# Patient Record
Sex: Male | Born: 1945
Health system: Southern US, Community
[De-identification: ages and names within clinical notes are randomized; demographics above are authoritative.]

## PROBLEM LIST (undated history)

## (undated) DIAGNOSIS — N289 Disorder of kidney and ureter, unspecified: Secondary | ICD-10-CM

## (undated) DIAGNOSIS — R350 Frequency of micturition: Secondary | ICD-10-CM

## (undated) DIAGNOSIS — I43 Cardiomyopathy in diseases classified elsewhere: Secondary | ICD-10-CM

## (undated) DIAGNOSIS — I10 Essential (primary) hypertension: Secondary | ICD-10-CM

## (undated) DIAGNOSIS — K5790 Diverticulosis of intestine, part unspecified, without perforation or abscess without bleeding: Secondary | ICD-10-CM

## (undated) DIAGNOSIS — R0600 Dyspnea, unspecified: Secondary | ICD-10-CM

## (undated) DIAGNOSIS — G4733 Obstructive sleep apnea (adult) (pediatric): Secondary | ICD-10-CM

## (undated) DIAGNOSIS — I2510 Atherosclerotic heart disease of native coronary artery without angina pectoris: Secondary | ICD-10-CM

## (undated) DIAGNOSIS — J189 Pneumonia, unspecified organism: Secondary | ICD-10-CM

## (undated) DIAGNOSIS — I639 Cerebral infarction, unspecified: Secondary | ICD-10-CM

## (undated) DIAGNOSIS — R569 Unspecified convulsions: Secondary | ICD-10-CM

## (undated) DIAGNOSIS — H269 Unspecified cataract: Secondary | ICD-10-CM

## (undated) DIAGNOSIS — I219 Acute myocardial infarction, unspecified: Secondary | ICD-10-CM

## (undated) DIAGNOSIS — I509 Heart failure, unspecified: Secondary | ICD-10-CM

## (undated) DIAGNOSIS — K635 Polyp of colon: Secondary | ICD-10-CM

## (undated) DIAGNOSIS — E854 Organ-limited amyloidosis: Secondary | ICD-10-CM

## (undated) DIAGNOSIS — C61 Malignant neoplasm of prostate: Secondary | ICD-10-CM

## (undated) DIAGNOSIS — I5032 Chronic diastolic (congestive) heart failure: Secondary | ICD-10-CM

## (undated) HISTORY — PX: PROSTATE BIOPSY: SHX241

## (undated) HISTORY — DX: Acute myocardial infarction, unspecified: I21.9

## (undated) HISTORY — DX: Essential (primary) hypertension: I10

## (undated) HISTORY — DX: Polyp of colon: K63.5

## (undated) HISTORY — DX: Unspecified cataract: H26.9

## (undated) HISTORY — DX: Obstructive sleep apnea (adult) (pediatric): G47.33

## (undated) HISTORY — DX: Diverticulosis of intestine, part unspecified, without perforation or abscess without bleeding: K57.90

## (undated) HISTORY — DX: Unspecified convulsions: R56.9

## (undated) HISTORY — DX: Cerebral infarction, unspecified: I63.9

## (undated) HISTORY — DX: Disorder of kidney and ureter, unspecified: N28.9

## (undated) HISTORY — DX: Pneumonia, unspecified organism: J18.9

---

## 1997-12-28 ENCOUNTER — Emergency Department (HOSPITAL_COMMUNITY): Admission: EM | Admit: 1997-12-28 | Discharge: 1997-12-28 | Payer: Self-pay | Admitting: Emergency Medicine

## 1999-08-18 ENCOUNTER — Encounter: Payer: Self-pay | Admitting: Internal Medicine

## 1999-08-18 ENCOUNTER — Ambulatory Visit (HOSPITAL_COMMUNITY): Admission: RE | Admit: 1999-08-18 | Discharge: 1999-08-18 | Payer: Self-pay | Admitting: Internal Medicine

## 2000-04-11 ENCOUNTER — Encounter: Admission: RE | Admit: 2000-04-11 | Discharge: 2000-07-10 | Payer: Self-pay | Admitting: Internal Medicine

## 2001-05-15 DIAGNOSIS — K635 Polyp of colon: Secondary | ICD-10-CM

## 2001-05-15 DIAGNOSIS — K5790 Diverticulosis of intestine, part unspecified, without perforation or abscess without bleeding: Secondary | ICD-10-CM

## 2001-05-15 HISTORY — PX: COLONOSCOPY W/ POLYPECTOMY: SHX1380

## 2001-05-15 HISTORY — DX: Polyp of colon: K63.5

## 2001-05-15 HISTORY — DX: Diverticulosis of intestine, part unspecified, without perforation or abscess without bleeding: K57.90

## 2001-09-26 ENCOUNTER — Encounter (INDEPENDENT_AMBULATORY_CARE_PROVIDER_SITE_OTHER): Payer: Self-pay | Admitting: *Deleted

## 2001-09-26 LAB — HM COLONOSCOPY

## 2001-11-21 ENCOUNTER — Encounter: Payer: Self-pay | Admitting: Rheumatology

## 2001-11-21 ENCOUNTER — Ambulatory Visit (HOSPITAL_COMMUNITY): Admission: RE | Admit: 2001-11-21 | Discharge: 2001-11-21 | Payer: Self-pay | Admitting: Rheumatology

## 2003-05-11 ENCOUNTER — Emergency Department (HOSPITAL_COMMUNITY): Admission: EM | Admit: 2003-05-11 | Discharge: 2003-05-11 | Payer: Self-pay | Admitting: Emergency Medicine

## 2004-10-05 ENCOUNTER — Ambulatory Visit: Payer: Self-pay | Admitting: Internal Medicine

## 2004-10-12 ENCOUNTER — Ambulatory Visit: Payer: Self-pay | Admitting: Internal Medicine

## 2005-01-13 ENCOUNTER — Ambulatory Visit: Payer: Self-pay | Admitting: Internal Medicine

## 2005-01-18 ENCOUNTER — Ambulatory Visit: Payer: Self-pay | Admitting: Internal Medicine

## 2005-04-20 ENCOUNTER — Ambulatory Visit: Payer: Self-pay | Admitting: Internal Medicine

## 2006-01-31 ENCOUNTER — Ambulatory Visit: Payer: Self-pay | Admitting: Internal Medicine

## 2007-03-05 ENCOUNTER — Ambulatory Visit: Payer: Self-pay | Admitting: Internal Medicine

## 2007-03-05 DIAGNOSIS — I10 Essential (primary) hypertension: Secondary | ICD-10-CM | POA: Insufficient documentation

## 2007-03-05 DIAGNOSIS — M109 Gout, unspecified: Secondary | ICD-10-CM

## 2007-03-14 ENCOUNTER — Encounter (INDEPENDENT_AMBULATORY_CARE_PROVIDER_SITE_OTHER): Payer: Self-pay | Admitting: *Deleted

## 2007-03-14 LAB — CONVERTED CEMR LAB
Basophils Absolute: 0.2 10*3/uL — ABNORMAL HIGH (ref 0.0–0.1)
Creatinine, Ser: 1.3 mg/dL (ref 0.4–1.5)
Eosinophils Absolute: 0.1 10*3/uL (ref 0.0–0.6)
Eosinophils Relative: 1.2 % (ref 0.0–5.0)
Lymphocytes Relative: 12.5 % (ref 12.0–46.0)
MCHC: 33.2 g/dL (ref 30.0–36.0)
MCV: 82.4 fL (ref 78.0–100.0)
Microalb, Ur: 40.6 mg/dL — ABNORMAL HIGH (ref 0.0–1.9)
Neutro Abs: 5.8 10*3/uL (ref 1.4–7.7)
Neutrophils Relative %: 78.4 % — ABNORMAL HIGH (ref 43.0–77.0)
Platelets: 207 10*3/uL (ref 150–400)
Uric Acid, Serum: 6.1 mg/dL (ref 2.4–7.0)
WBC: 7.3 10*3/uL (ref 4.5–10.5)

## 2007-03-28 ENCOUNTER — Encounter (INDEPENDENT_AMBULATORY_CARE_PROVIDER_SITE_OTHER): Payer: Self-pay | Admitting: *Deleted

## 2007-03-28 ENCOUNTER — Ambulatory Visit: Payer: Self-pay | Admitting: Internal Medicine

## 2007-05-07 ENCOUNTER — Ambulatory Visit: Payer: Self-pay | Admitting: Internal Medicine

## 2007-05-17 ENCOUNTER — Encounter (INDEPENDENT_AMBULATORY_CARE_PROVIDER_SITE_OTHER): Payer: Self-pay | Admitting: *Deleted

## 2007-05-17 LAB — CONVERTED CEMR LAB: Hgb A1c MFr Bld: 7.2 % — ABNORMAL HIGH (ref 4.6–6.0)

## 2007-06-21 ENCOUNTER — Telehealth: Payer: Self-pay | Admitting: Internal Medicine

## 2007-06-24 ENCOUNTER — Telehealth (INDEPENDENT_AMBULATORY_CARE_PROVIDER_SITE_OTHER): Payer: Self-pay | Admitting: *Deleted

## 2007-06-25 ENCOUNTER — Ambulatory Visit: Payer: Self-pay | Admitting: Internal Medicine

## 2007-06-25 ENCOUNTER — Telehealth: Payer: Self-pay | Admitting: Internal Medicine

## 2007-06-25 LAB — CONVERTED CEMR LAB
BUN: 16 mg/dL (ref 6–23)
Calcium: 10 mg/dL (ref 8.4–10.5)
GFR calc Af Amer: 80 mL/min
GFR calc non Af Amer: 66 mL/min
Potassium: 4.2 meq/L (ref 3.5–5.1)

## 2007-06-26 ENCOUNTER — Telehealth (INDEPENDENT_AMBULATORY_CARE_PROVIDER_SITE_OTHER): Payer: Self-pay | Admitting: *Deleted

## 2007-07-02 ENCOUNTER — Ambulatory Visit: Payer: Self-pay | Admitting: Internal Medicine

## 2007-07-18 ENCOUNTER — Encounter: Payer: Self-pay | Admitting: Internal Medicine

## 2007-08-07 ENCOUNTER — Ambulatory Visit (HOSPITAL_COMMUNITY): Admission: RE | Admit: 2007-08-07 | Discharge: 2007-08-07 | Payer: Self-pay | Admitting: Ophthalmology

## 2007-08-22 ENCOUNTER — Ambulatory Visit: Payer: Self-pay | Admitting: Internal Medicine

## 2007-08-31 LAB — CONVERTED CEMR LAB
Hgb A1c MFr Bld: 8.2 % — ABNORMAL HIGH (ref 4.6–6.0)
Potassium: 4.1 meq/L (ref 3.5–5.1)

## 2007-09-05 ENCOUNTER — Encounter: Payer: Self-pay | Admitting: Internal Medicine

## 2007-09-12 ENCOUNTER — Encounter (INDEPENDENT_AMBULATORY_CARE_PROVIDER_SITE_OTHER): Payer: Self-pay | Admitting: *Deleted

## 2007-09-12 ENCOUNTER — Ambulatory Visit: Payer: Self-pay | Admitting: Internal Medicine

## 2007-11-12 ENCOUNTER — Ambulatory Visit: Payer: Self-pay | Admitting: Internal Medicine

## 2007-11-18 ENCOUNTER — Encounter (INDEPENDENT_AMBULATORY_CARE_PROVIDER_SITE_OTHER): Payer: Self-pay | Admitting: *Deleted

## 2007-11-18 LAB — CONVERTED CEMR LAB
Creatinine, Ser: 1.4 mg/dL (ref 0.4–1.5)
Creatinine,U: 282.3 mg/dL
LDL Cholesterol: 62 mg/dL (ref 0–99)
Microalb Creat Ratio: 52.1 mg/g — ABNORMAL HIGH (ref 0.0–30.0)
Microalb, Ur: 14.7 mg/dL — ABNORMAL HIGH (ref 0.0–1.9)
Total CHOL/HDL Ratio: 2.2

## 2007-11-27 ENCOUNTER — Ambulatory Visit: Payer: Self-pay | Admitting: Internal Medicine

## 2008-06-01 ENCOUNTER — Ambulatory Visit: Payer: Self-pay | Admitting: Internal Medicine

## 2008-06-07 LAB — CONVERTED CEMR LAB
BUN: 19 mg/dL (ref 6–23)
Creatinine, Ser: 1.5 mg/dL (ref 0.4–1.5)
Hgb A1c MFr Bld: 7.8 % — ABNORMAL HIGH (ref 4.6–6.0)
Microalb Creat Ratio: 62.4 mg/g — ABNORMAL HIGH (ref 0.0–30.0)
Potassium: 4 meq/L (ref 3.5–5.1)

## 2008-06-08 ENCOUNTER — Encounter (INDEPENDENT_AMBULATORY_CARE_PROVIDER_SITE_OTHER): Payer: Self-pay | Admitting: *Deleted

## 2008-06-15 ENCOUNTER — Ambulatory Visit: Payer: Self-pay | Admitting: Internal Medicine

## 2008-08-17 ENCOUNTER — Ambulatory Visit: Payer: Self-pay | Admitting: Internal Medicine

## 2008-08-21 ENCOUNTER — Encounter (INDEPENDENT_AMBULATORY_CARE_PROVIDER_SITE_OTHER): Payer: Self-pay | Admitting: *Deleted

## 2008-08-21 LAB — CONVERTED CEMR LAB: Hgb A1c MFr Bld: 6.8 % — ABNORMAL HIGH (ref 4.6–6.5)

## 2008-08-28 ENCOUNTER — Ambulatory Visit: Payer: Self-pay | Admitting: Internal Medicine

## 2008-08-28 DIAGNOSIS — R350 Frequency of micturition: Secondary | ICD-10-CM | POA: Insufficient documentation

## 2008-08-28 LAB — CONVERTED CEMR LAB
Blood in Urine, dipstick: NEGATIVE
Ketones, urine, test strip: NEGATIVE
Nitrite: NEGATIVE
Specific Gravity, Urine: 1.01

## 2008-12-24 ENCOUNTER — Ambulatory Visit: Payer: Self-pay | Admitting: Internal Medicine

## 2008-12-28 ENCOUNTER — Encounter (INDEPENDENT_AMBULATORY_CARE_PROVIDER_SITE_OTHER): Payer: Self-pay | Admitting: *Deleted

## 2009-08-19 ENCOUNTER — Ambulatory Visit: Payer: Self-pay | Admitting: Internal Medicine

## 2009-08-19 DIAGNOSIS — R609 Edema, unspecified: Secondary | ICD-10-CM

## 2009-08-20 ENCOUNTER — Ambulatory Visit: Payer: Self-pay | Admitting: Internal Medicine

## 2009-08-23 ENCOUNTER — Telehealth (INDEPENDENT_AMBULATORY_CARE_PROVIDER_SITE_OTHER): Payer: Self-pay | Admitting: *Deleted

## 2009-08-23 LAB — CONVERTED CEMR LAB
BUN: 27 mg/dL — ABNORMAL HIGH (ref 6–23)
Basophils Relative: 0.2 % (ref 0.0–3.0)
Eosinophils Relative: 0.6 % (ref 0.0–5.0)
Hgb A1c MFr Bld: 7.7 % — ABNORMAL HIGH (ref 4.6–6.5)
Lymphocytes Relative: 25.3 % (ref 12.0–46.0)
MCV: 80.9 fL (ref 78.0–100.0)
Monocytes Absolute: 1.3 10*3/uL — ABNORMAL HIGH (ref 0.1–1.0)
Neutrophils Relative %: 55.1 % (ref 43.0–77.0)
Potassium: 4.1 meq/L (ref 3.5–5.1)
RBC: 4.69 M/uL (ref 4.22–5.81)
WBC: 6.9 10*3/uL (ref 4.5–10.5)

## 2009-08-24 ENCOUNTER — Telehealth: Payer: Self-pay | Admitting: Internal Medicine

## 2009-08-31 ENCOUNTER — Ambulatory Visit: Payer: Self-pay | Admitting: Internal Medicine

## 2009-08-31 ENCOUNTER — Encounter (INDEPENDENT_AMBULATORY_CARE_PROVIDER_SITE_OTHER): Payer: Self-pay | Admitting: *Deleted

## 2009-08-31 LAB — CONVERTED CEMR LAB: OCCULT 1: NEGATIVE

## 2009-09-23 ENCOUNTER — Ambulatory Visit: Payer: Self-pay | Admitting: Internal Medicine

## 2009-09-23 DIAGNOSIS — Z8719 Personal history of other diseases of the digestive system: Secondary | ICD-10-CM

## 2009-09-23 DIAGNOSIS — N259 Disorder resulting from impaired renal tubular function, unspecified: Secondary | ICD-10-CM | POA: Insufficient documentation

## 2009-09-23 DIAGNOSIS — D649 Anemia, unspecified: Secondary | ICD-10-CM | POA: Insufficient documentation

## 2009-09-27 ENCOUNTER — Ambulatory Visit: Payer: Self-pay | Admitting: Internal Medicine

## 2009-09-28 ENCOUNTER — Encounter: Payer: Self-pay | Admitting: Internal Medicine

## 2009-09-28 LAB — CONVERTED CEMR LAB
Basophils Absolute: 0 10*3/uL (ref 0.0–0.1)
Creatinine, Ser: 1.9 mg/dL — ABNORMAL HIGH (ref 0.4–1.5)
Eosinophils Relative: 4 % (ref 0.0–5.0)
Folate: 5.4 ng/mL
Iron: 44 ug/dL (ref 42–165)
Lymphs Abs: 1.6 10*3/uL (ref 0.7–4.0)
Monocytes Absolute: 0.4 10*3/uL (ref 0.1–1.0)
Monocytes Relative: 9.4 % (ref 3.0–12.0)
Neutrophils Relative %: 50.6 % (ref 43.0–77.0)
Platelets: 191 10*3/uL (ref 150.0–400.0)
RDW: 14.8 % — ABNORMAL HIGH (ref 11.5–14.6)
Transferrin: 204.8 mg/dL — ABNORMAL LOW (ref 212.0–360.0)
Uric Acid, Serum: 8.9 mg/dL — ABNORMAL HIGH (ref 4.0–7.8)
Vitamin B-12: 349 pg/mL (ref 211–911)
WBC: 4.6 10*3/uL (ref 4.5–10.5)

## 2009-09-29 ENCOUNTER — Encounter (INDEPENDENT_AMBULATORY_CARE_PROVIDER_SITE_OTHER): Payer: Self-pay | Admitting: *Deleted

## 2009-10-03 ENCOUNTER — Encounter: Admission: RE | Admit: 2009-10-03 | Discharge: 2009-10-03 | Payer: Self-pay | Admitting: Internal Medicine

## 2009-12-10 ENCOUNTER — Ambulatory Visit: Payer: Self-pay | Admitting: Internal Medicine

## 2010-03-11 ENCOUNTER — Encounter: Payer: Self-pay | Admitting: Internal Medicine

## 2010-06-14 NOTE — Assessment & Plan Note (Signed)
Summary: 4 Week follow-up/scm   Vital Signs:  Patient profile:   65 year old male Weight:      197.4 pounds Pulse rate:   56 / minute Resp:     16 per minute BP sitting:   182 / 110  (left arm) Cuff size:   large  Vitals Entered By: Georgette Dover (Sep 23, 2009 10:30 AM) CC: 4 week follow-up, Hypertension Management Comments REVIEWED MED LIST, PATIENT AGREED DOSE AND INSTRUCTION CORRECT    CC:  4 week follow-up and Hypertension Management.  History of Present Illness: Labs 08/19/2009 reviewed & risk discussed. No gout symptoms. BP @ home [id number] systolic. Decreased salt in diet; he has not been restricting HFCS. Relationship of HFCS to central obesity & increased uric acid reviewed.He was taught to look for HFCS as #1,2 or # 3 on label & then assess grams of sugar. Goal = < 40 grams / day from HFCS sugar sources.FBS 120-130; no definite hypoglycemic spells.ROS for blood loss or bleeding dyscrasias neg except incident in 04/2009.FOB were negative in 08/2009.  Hypertension History:      He denies headache, chest pain, palpitations, dyspnea with exertion, orthopnea, PND, peripheral edema, visual symptoms, neurologic problems, syncope, and side effects from treatment.        Positive major cardiovascular risk factors include male age 45 years old or older, diabetes, and hypertension.  Negative major cardiovascular risk factors include non-tobacco-user status.     Allergies (verified): No Known Drug Allergies  Review of Systems General:  Denies fatigue. Eyes:  Denies blurring, double vision, and vision loss-both eyes. ENT:  Denies nosebleeds. Resp:  Denies coughing up blood. GI:  Denies bloody stools and dark tarry stools; He had rectal bleeding in late 04/2009, no recurrence . Last colonoscopy was ?. GU:  Denies hematuria. MS:  Denies joint pain. Derm:  Denies poor wound healing. Neuro:  Denies numbness and tingling. Endo:  Denies excessive hunger; Occasional polydipsia &  polyuria. Heme:  Denies abnormal bruising and bleeding.  Physical Exam  General:  in no acute distress; alert,appropriate and cooperative throughout examination Lungs:  Normal respiratory effort, chest expands symmetrically. Lungs are clear to auscultation, no crackles or wheezes. Heart:  normal rate, regular rhythm, no gallop, no rub, no JVD, no HJR, and grade  1/6 systolic murmur @ apex.   Abdomen:  Bowel sounds positive,abdomen soft and non-tender without masses, organomegaly or hernias noted. No AAA or bruits Pulses:  R and L carotid,radial,dorsalis pedis and posterior tibial pulses are full and equal bilaterally Extremities:  No clubbing, cyanosis, edema. Neurologic:  alert & oriented X3.   Skin:  Intact without suspicious lesions or rashes Cervical Nodes:  No lymphadenopathy noted Axillary Nodes:  No palpable lymphadenopathy Psych:  memory intact for recent and remote, normally interactive, and good eye contact.     Impression & Recommendations:  Problem # 1:  HYPERTENSION, ESSENTIAL NOS (ICD-401.9)  repeat BP 170/110.  HCTZ not option due to gout & #2 His updated medication list for this problem includes:    Lasix 40 Mg Tabs (Furosemide) .Marland Kitchen... 1 by mouth once daily **appointment due**    Coreg 25 Mg Tabs (Carvedilol) .Marland Kitchen... 1 two times a day    Lotensin 40 Mg Tabs (Benazepril hcl) .Marland Kitchen... 1 qd    Amlodipine Besylate 10 Mg Tabs (Amlodipine besylate) .Marland Kitchen... 1 qd  Orders: TLB-Creatinine, Blood (82565-CREA) TLB-Potassium (K+) (84132-K) TLB-BUN (Urea Nitrogen) (84520-BUN)  Problem # 2:  RENAL INSUFFICIENCY (ICD-588.9)  Orders: Venipuncture [id number])  Problem # 3:  ANEMIA (ICD-285.9)  Orders: Venipuncture [id number]) TLB-B12 + Folate Pnl YT:[id number]) TLB-IBC Pnl (Iron/FE;Transferrin) (83550-IBC) TLB-CBC Platelet - w/Differential (85025-CBCD)  Problem # 4:  RECTAL BLEEDING, HX OF (ICD-V12.79) 04/2009; negative FOB in 08/2009  Problem # 5:  GOUT  (ICD-274.9) inactive Orders: Venipuncture [id number]) TLB-Uric Acid, Blood (84550-URIC)  Complete Medication List: 1)  Actos 30 Mg Tabs (Pioglitazone hcl) .Marland Kitchen.. 1 once daily see note 2)  Lasix 40 Mg Tabs (Furosemide) .Marland Kitchen.. 1 by mouth once daily **appointment due** 3)  Coreg 25 Mg Tabs (Carvedilol) .Marland Kitchen.. 1 two times a day 4)  Lotensin 40 Mg Tabs (Benazepril hcl) .Marland Kitchen.. 1 qd 5)  Amlodipine Besylate 10 Mg Tabs (Amlodipine besylate) .Marland Kitchen.. 1 qd 6)  Glimepiride 4 Mg Tabs (Glimepiride) .... 1/2 tab bid 7)  Metformin Hcl 500 Mg Xr24h-tab (Metformin hcl) .Marland Kitchen.. 1 two times a day with 2 largest meals. additional refills require an appointment  Hypertension Assessment/Plan:      The patient's hypertensive risk group is category C: Target organ damage and/or diabetes.  His calculated 10 year risk of coronary heart disease is 14 %.  Today's blood pressure is 182/110.    Patient Instructions: 1)  Consume LESS THAN 40 grams of sugar/ day from foods & drinks with  High Fructose Corn Syrup as #1,2 or # 3 on label.Stop Lotensin & use Micardis 80 mg samples. Check your blood sugars regularly. If your readings are usually above : 150 or below 90 you should contact our office.Check your Blood Pressure regularly. The goal = AVERAGE < 140/90 Prescriptions: ACTOS 30 MG  TABS (PIOGLITAZONE HCL) 1 once daily see note  #30 x 5   Entered and Authorized by:   Unice Cobble MD   Signed by:   Unice Cobble MD on 09/23/2009   Method used:   F[facility]ed to ...       [facility]       [address]       [place], [place]  [address]       Ph: [phone]       Fax: [phone]   RxID:   [id number]

## 2010-06-14 NOTE — Assessment & Plan Note (Signed)
Summary: SWOLLEN FEET/KDC   Vital Signs:  Patient profile:   65 year old male Weight:      195.8 pounds Temp:     102.0 degrees F oral Pulse rate:   88 / minute Resp:     17 per minute BP sitting:   190 / 110  (left arm) Cuff size:   large  Vitals Entered By: Georgette Dover (August 19, 2009 3:01 PM) CC: Swelling in feet since Monday Comments REVIEWED MED LIST, PATIENT AGREED DOSE AND INSTRUCTION CORRECT    CC:  Swelling in feet since Monday.  History of Present Illness: Edema  of L foot progressive since 08/16/2009 w/o trigger of excess salt . He has been  noncompliant with BP meds X 1 week ;meds missed ( taken every other day) because  "my foot  hurting" since 04/03. Rx: Epsom Salts soaks, usiong his wife's  non rolling walker ( from hip surgery). FBS 104-120 (Note:answers very hesitant & guarded); no 2 hr post meal glucoses. No hypoglycemia. Weight stable.He did not return as recommended in 12/2008; A1c was 7.1% then. He was unaware of fever ; his chief reason for OV was to have Furosemide revealed.  Allergies (verified): No Known Drug Allergies  Review of Systems General:  Denies chills, fatigue, fever, and sweats. Eyes:  Last exam 6 mos  ago, no retinopathy. ENT:  No purulence. CV:  Denies chest pain or discomfort, difficulty breathing at night, difficulty breathing while lying down, lightheadness, and near fainting. Resp:  Denies cough and sputum productive. GU:  Denies discharge, dysuria, hematuria, and urinary hesitancy. MS:  Denies joint pain, joint redness, and joint swelling. Derm:  Denies changes in color of skin and poor wound healing; No pustules or wounds of LLE . Neuro:  Denies numbness and tingling; No burning in feet. Endo:  Complains of excessive thirst; denies excessive hunger and excessive urination.  Physical Exam  General:  in no acute distress;  cooperative throughout examination but extremely poor historian ( dates corrected several times; he did not  differentiate that only 1 ,not bot feet involved) Eyes:  No corneal or conjunctival inflammation noted.No icterus Lungs:  Normal respiratory effort, chest expands symmetrically. Lungs are clear to auscultation, no crackles or wheezes. Heart:  normal rate, regular rhythm, no murmur, no gallop, no rub, no JVD, and no HJR. S4 with slurring  Abdomen:  Bowel sounds positive,abdomen soft and non-tender without masses, organomegaly or hernias noted. Msk:  bunions; enlarged 1st MCP joint .   Pulses:  R and L carotid,radial,dorsalis pedis  pulses are full and equal bilaterally. Decreased PTP Extremities:  1+ left pedal edema.  Marked toenail deformities.L foot tender to compression  & warm. Neg Homan's . Bunions & toe contractures Neurologic:  sensation intact to light touch over feet.   Skin:  Intact without suspicious lesions or rashes. No clinical cellulitis or erythema despite increased temp to touch over L foot Cervical Nodes:  No lymphadenopathy noted Axillary Nodes:  No palpable lymphadenopathy Psych:  Oriented X3, flat affect, and subdued.  Again history disjointed & unfocused   Impression & Recommendations:  Problem # 1:  EDEMA, LOCALIZED (ICD-782.3) L foot & ankle His updated medication list for this problem includes:    Lasix 40 Mg Tabs (Furosemide) .Marland Kitchen... 1 by mouth once daily **appointment due**  Orders: T-Foot Left Min 3 Views (73630TC) Venipuncture HR:[id number])  Problem # 2:  FOOT PAIN, LEFT (ICD-729.5)  R/O gout vs osteomyelitis  Orders: T-Foot Left Min 3  Views (73630TC) Venipuncture [phone]) TLB-Uric Acid, Blood (84550-URIC)  Problem # 3:  FEVER (ICD-780.60)  no visable skin lesion (even intertriginously); no localizing symptoms  Orders: Venipuncture [id number]) TLB-CBC Platelet - w/Differential (85025-CBCD)  Problem # 4:  DIABETES MELLITUS, CONTROLLED (ICD-250.00)  Controlled by hx but data  suspect due to non compliance & prior A1c His updated medication list for this  problem includes:    Actos 30 Mg Tabs (Pioglitazone hcl) .Marland Kitchen... 1 once daily see note    Lotensin 40 Mg Tabs (Benazepril hcl) .Marland Kitchen... 1 qd    Glimepiride 4 Mg Tabs (Glimepiride) .Marland Kitchen... 1/2 tab bid    Metformin Hcl 500 Mg Xr24h-tab (Metformin hcl) .Marland Kitchen... 1 two times a day with 2 largest meals. additional refills require an appointment  Orders: Venipuncture [id number]) TLB-Creatinine, Blood (82565-CREA) TLB-Potassium (K+) (84132-K) TLB-BUN (Urea Nitrogen) (84520-BUN) TLB-A1C / Hgb A1C (Glycohemoglobin) (83036-A1C)  Problem # 5:  HYPERTENSION, ESSENTIAL NOS (ICD-401.9)  uncontrolled due to non compliance; very high  risk discussed His updated medication list for this problem includes:    Lasix 40 Mg Tabs (Furosemide) .Marland Kitchen... 1 by mouth once daily **appointment due**    Coreg 25 Mg Tabs (Carvedilol) .Marland Kitchen... 1 two times a day    Lotensin 40 Mg Tabs (Benazepril hcl) .Marland Kitchen... 1 qd    Amlodipine Besylate 10 Mg Tabs (Amlodipine besylate) .Marland Kitchen... 1 qd  Orders: Venipuncture IM:[id number])  Complete Medication List: 1)  Actos 30 Mg Tabs (Pioglitazone hcl) .Marland Kitchen.. 1 once daily see note 2)  Lasix 40 Mg Tabs (Furosemide) .Marland Kitchen.. 1 by mouth once daily **appointment due** 3)  Coreg 25 Mg Tabs (Carvedilol) .Marland Kitchen.. 1 two times a day 4)  Lotensin 40 Mg Tabs (Benazepril hcl) .Marland Kitchen.. 1 qd 5)  Amlodipine Besylate 10 Mg Tabs (Amlodipine besylate) .Marland Kitchen.. 1 qd 6)  Glimepiride 4 Mg Tabs (Glimepiride) .... 1/2 tab bid 7)  Metformin Hcl 500 Mg Xr24h-tab (Metformin hcl) .Marland Kitchen.. 1 two times a day with 2 largest meals. additional refills require an appointment 8)  Amoxicillin-pot Clavulanate 875-125 Mg Tabs (Amoxicillin-pot clavulanate) .Marland Kitchen.. 1 two times a day with a meal 9)  Tramadol Hcl 50 Mg Tabs (Tramadol hcl) .Marland Kitchen.. 1 every 6 hrs as needed pain  Patient Instructions: 1)  Resume ALL BP meds immediately due to risk of stroke or heart attack as discussed. Please consider seeing a  Podiatrist to treat nails. 2)  Check your blood sugars regularly. If  your readings are usually above :150 or below 90 you should contact our office. 3)  See your eye doctor yearly to check for diabetic eye damage. 4)  Check your feet each night for sore areas, calluses or signs of infection. 5)  Check your Blood Pressure regularly. If it is above: 140/90 ON AVERAGE you should make an appointment. 6)  Take 650-1000mg  of Tylenol every 4-6 hours as needed for relief of  fever. AVOID taking more than 4000mg   in a 24 hour period (can cause liver damage in higher doses). Prescriptions: LASIX 40 MG  TABS (FUROSEMIDE) 1 by mouth once daily **APPOINTMENT DUE**  #90 x 1   Entered and Authorized by:   Unice Cobble MD   Signed by:   Unice Cobble MD on 08/19/2009   Method used:   [facility]ed to ...       [facility]       [address]       [place], [place]  [address]  Ph: [phone]       Fax: [phone]   [id number] TRAMADOL HCL 50 MG TABS (TRAMADOL HCL) 1 every 6 hrs as needed pain  #30 x 1   Entered and Authorized by:   Unice Cobble MD   Signed by:   Unice Cobble MD on 08/19/2009   Method used:   Faxed to ...       [facility]       [address]       [place], [place]  [address]       Ph: [phone]       Fax: [phone]   RxID:   [id number] AMOXICILLIN-POT CLAVULANATE 875-125 MG TABS (AMOXICILLIN-POT CLAVULANATE) 1 two times a day with a meal  #20 x 0   Entered and Authorized by:   Unice Cobble MD   Signed by:   Unice Cobble MD on 08/19/2009   Method used:   Faxed to ...       [facility]       [address]       [place], [place]  [address]       Ph: [phone]       Fax: [phone]   RxID:   [id number]

## 2010-06-14 NOTE — Progress Notes (Signed)
----   Converted from flag ---- ---- 08/24/2009 5:24 PM, Doyle Askew, CCS-P, [facility] wrote: I think based on your documentation of his MDM, you could bill a level 5.  It also looks like you spent a good deal of time in discussion with him about his non-compliance, remember you can always document time spent to help with the level.  Manuela Schwartz  ---- 08/19/2009 7:00 PM, Unice Cobble MD wrote: Is this possibly  a 5? His issues are high complexity & high risk. Thanks, Hopp ------------------------------

## 2010-06-14 NOTE — Procedures (Signed)
Summary: colonoscopy  [name]   Colonoscopy  Procedure date:  09/26/2001  Findings:      Results: Diverticulosis.       Location:  [place].    Comments:      Repeat colonoscopy in 5 years.  Patient Name: Corey [name], Corey [name] MRN:  Procedure Procedures: Colonoscopy CPT: [phone].    with Hot Biopsy(s)CPT: [id number].  Personnel: Endoscopist: Clarene Reamer, MD.  Referred By: Darrick Penna. Linna Darner, MD.  Exam Location: Exam performed in Outpatient Clinic. Outpatient  Patient Consent: Procedure, Alternatives, Risks and Benefits discussed, consent obtained, from patient. Consent was obtained by the RN.  Indications  Average Risk Screening Routine.  History  Pre-Exam Physical: Performed Sep 26, 2001. Rectal exam, Abdominal exam, Extremity exam, Mental status exam WNL.  Exam Exam: Extent of exam reached: Cecum, extent intended: Cecum.  The cecum was identified by appendiceal orifice and IC valve. Colon retroflexion performed. Images were not taken. ASA Classification: II. Tolerance: good.  Monitoring: Pulse and BP monitoring, Oximetry used. Supplemental O2 given.  Colon Prep Prep results: fair, adequate exam.  Sedation Meds: Patient assessed and found to be appropriate for moderate (conscious) sedation. Fentanyl 100 mcg. given IV. Versed 5 mg. given IV.  Findings POLYP: Descending Colon, Maximum size: 2 mm. sessile polyp. Procedure:  hot biopsy, removed, not retrieved, ICD9: Colon Polyps: 211.3.  - DIVERTICULOSIS: Cecum to Rectum. ICD9: Diverticulosis: 562.10. Comments: moderately severe diverticulosis.   Assessment Abnormal examination, see findings above.  Diagnoses: 562.10: Diverticulosis.  211.3: Colon Polyps.   Events  Unplanned Interventions: No intervention was required.  Unplanned Events: There were no complications. Plans Medication Plan: Continue current medications.  Patient Education: Patient given standard instructions for: Polyps.  Diverticulosis. Yearly hemoccult testing recommended. Patient instructed to get routine colonoscopy every 5 years.  Disposition: After procedure patient sent to recovery. After recovery patient sent home.  This report was created from the original endoscopy report, which was reviewed and signed by the above listed endoscopist.

## 2010-06-14 NOTE — Miscellaneous (Signed)
Summary: Orders Update  Clinical Lists Changes  Orders: Added new Referral order of Gastroenterology Referral (GI) - Signed Added new Referral order of Nephrology Referral (Nephro) - Signed Added new Referral order of Radiology Referral (Radiology) - Signed

## 2010-06-14 NOTE — Letter (Signed)
Summary: New Patient letter  [facility]  [address] [place], [place] [address]   Phone: [phone]  Fax: [phone]       09/29/2009 MRN: [id number]  Corey [name] [place], [place]  [address]  Dear Mr. Pricilla Handler,  Welcome to the Gastroenterology Division at [facility].    You are scheduled to see Dr. Deatra Ina on 11-08-09 at 3:00p.m. on the 3rd floor at [facility], [place] [facility].  We ask that you try to arrive at our office 15 minutes prior to your appointment time to allow for check-in.  We would like you to complete the enclosed self-administered evaluation form prior to your visit and bring it with you on the day of your appointment.  We will review it with you.  Also, please bring a complete list of all your medications or, if you prefer, bring the medication bottles and we will list them.  Please bring your insurance card so that we may make a copy of it.  If your insurance requires a referral to see a specialist, please bring your referral form from your primary care physician.  Co-payments are due at the time of your visit and may be paid by cash, check or credit card.     Your office visit will consist of a consult with your physician (includes a physical exam), any laboratory testing he/she may order, scheduling of any necessary diagnostic testing (e.g. x-ray, ultrasound, CT-scan), and scheduling of a procedure (e.g. Endoscopy, Colonoscopy) if required.  Please allow enough time on your schedule to allow for any/all of these possibilities.    If you cannot keep your appointment, please call [phone] to cancel or reschedule prior to your appointment date.  This allows [place] the opportunity to schedule an appointment for another patient in need of care.  If you do not cancel or reschedule by 5 p.m. the business day prior to your appointment date, you will be charged a $50.00 late cancellation/no-show fee.    Thank you for choosing [facility] for your medical needs.  We appreciate the opportunity to care for you.  Please visit [place] at our website  to learn more about our practice.                     Sincerely,                                                             The Gastroenterology Division

## 2010-06-14 NOTE — Assessment & Plan Note (Signed)
Summary: ACUTE FOR HIGH BLOOD PRESSURE//PH   Vital Signs:  Patient profile:   65 year old male Weight:      206.8 pounds O2 Sat:      98 % Temp:     98.7 degrees F oral Pulse rate:   55 / minute Resp:     16 per minute BP sitting:   170 / 94  (left arm) Cuff size:   large  Vitals Entered By: Georgette Dover CMA (December 10, 2009 12:55 PM) CC: B/P CONCERNS   CC:  B/P CONCERNS.  History of Present Illness:  Hypertension Follow-Up      This is a 65 year old man who presents for Hypertension follow-up.  BP @ job interview today was 180/120. The patient reports urinary frequency, but denies lightheadedness, headaches, edema, rash, and fatigue.  The patient denies the following associated symptoms: chest pain, chest pressure, exercise intolerance, dyspnea, palpitations, syncope, leg edema, and pedal edema.  Compliance with medications (by patient report) has been near 100%.  The patient reports exercising occasionally.  Adjunctive measures currently used by the patient include salt restriction.  He has not been on Amlodipine ; he does not know why.  Current Medications (verified): 1)  Actos 30 Mg  Tabs (Pioglitazone Hcl) .[facility].. 1 Once Daily See Note 2)  Lasix 40 Mg  Tabs (Furosemide) .Marland Kitchen.. 1 By Mouth Once Daily **appointment Due** 3)  Coreg 25 Mg  Tabs (Carvedilol) .Marland Kitchen.. 1 Two Times A Day 4)  Glimepiride 4 Mg  Tabs (Glimepiride) .... 1/2 Tab Bid 5)  Metformin Hcl 500 Mg Xr24h-Tab (Metformin Hcl) .Marland Kitchen.. 1 Two Times A Day With 2 Largest Meals. 6)  Micardis 80 Mg Tabs (Telmisartan) .Marland Kitchen.. 1 Once Daily As Samples in Place of Lotesin 40 As Trial  Allergies (verified): No Known Drug Allergies  Physical Exam  General:  well-nourished,in no acute distress; alert,appropriate and cooperative throughout examination Eyes:  No corneal or conjunctival inflammation noted. but small.Perrla. Funduscopic exam benign, without hemorrhages, exudates or papilledema. Lungs:  Normal respiratory effort, chest expands  symmetrically. Lungs are clear to auscultation, no crackles or wheezes. Heart:  normal rate, regular rhythm, no gallop, no rub, no JVD, and grade 1 /6 systolic murmur.   Pulses:  R and L carotid,radial,dorsalis pedis and posterior tibial pulses are full and equal bilaterally Extremities:  No clubbing, cyanosis, edema.   Impression & Recommendations:  Problem # 1:  HYPERTENSION, ESSENTIAL NOS (ICD-401.9)  The following medications were removed from the medication list:    Lotensin 40 Mg Tabs (Benazepril hcl) .Marland Kitchen... 1 once daily (being held while on micardis 80 mg once daily samples to see if better bp control can be achieved)BP was better on ARB by report    Amlodipine Besylate 10 Mg Tabs (Amlodipine besylate) .Marland Kitchen... 1 once daily: inadvertently stopped; it will be reordered His updated medication list for this problem includes:    Lasix 40 Mg Tabs (Furosemide) .Marland Kitchen... 1 by mouth once daily **appointment due**    Coreg 25 Mg Tabs (Carvedilol) .Marland Kitchen... 1 two times a day    Micardis 80 Mg Tabs (Telmisartan) .Marland Kitchen... 1 once daily as samples in place of lotesin 40 as trial  Complete Medication List: 1)  Actos 30 Mg Tabs (Pioglitazone hcl) .Marland Kitchen.. 1 once daily see note 2)  Lasix 40 Mg Tabs (Furosemide) .Marland Kitchen.. 1 by mouth once daily **appointment due** 3)  Coreg 25 Mg Tabs (Carvedilol) .Marland Kitchen.. 1 two times a day 4)  Glimepiride 4 Mg Tabs (Glimepiride) .[facility]  1/2 tab bid 5)  Metformin Hcl 500 Mg Xr24h-tab (Metformin hcl) .Marland Kitchen.. 1 two times a day with 2 largest meals. 6)  Micardis 80 Mg Tabs (Telmisartan) .Marland Kitchen.. 1 once daily as samples in place of lotesin 40 as trial 7)  Amlodipine Besylate 10 Mg Tabs (Amlodipine besylate) .Marland Kitchen.. 1 once daily  Patient Instructions: 1)  Limit your Sodium (Salt) to less than 2 grams a day(slightly less than 1/2 a teaspoon) to prevent fluid retention, swelling, or worsening of symptoms. 2)  Check your Blood Pressure regularly. Your goal = AVERAGE < 140/90. Prescriptions: AMLODIPINE BESYLATE  10 MG TABS (AMLODIPINE BESYLATE) 1 once daily  #30 x 5   Entered and Authorized by:   Unice Cobble MD   Signed by:   Unice Cobble MD on 12/10/2009   Method used:   F[facility]ed to ...       [facility]       [address]       [place], [place]  [address]       Ph: [phone]       Fax: [phone]   RxID:   [id number]

## 2010-06-14 NOTE — Progress Notes (Signed)
Summary: Lab Results  Phone Note Outgoing Call Call back at [facility] Phone [phone]   Call placed by: Corey [name],  Corey [name], Corey [name] 1:24 PM Call placed to: Patient Summary of Call: Spoke with patient:  No elevation of white count despite the fever. Mild anemia present; please complete stool cards. Significant kidney impairment present. This should improve with control of Diabetes & BP (Minimal BP goal = < 140/90 ON AVERAGE to lower risk of stroke or heart attack). Uric acid causes gout; the minimal safe  level is less than 7. Minimal acceptable A1c for Diabetes control is less than 7%.Read ALL food & drink labels. Consume LESS THAN 40 grams of sugar/ day from those with High Fructose Corn Syrup (HFCS) as #1,  2 or # 3 on label.HFCS will raise uric acid & increase gout attack risk. Your blood count should be checked in 4 weeks with a B12, folate & iron panel to rule out progressive anemia. Bring your Glucometer to office visit; the glucoses you described do not correlate with the A1c of 7.7%.   Patient ok'd all information, labs and stool cards to be mailed./[facility]  Corey [name], Corey [name] 1:29 PM

## 2010-06-14 NOTE — Letter (Signed)
Summary: Results Follow up Letter  [facility]   [place], [place] [address]   Phone: [phone]  Fax: [phone]    08/31/2009 MRN: [id number]  Corey [name] [place], [place]  [address]  Dear [name],  The following are the results of your recent test(s):  Test         Result    Pap Smear:        Normal _____  Not Normal _____ Comments: ______________________________________________________ Cholesterol: LDL(Bad cholesterol):         Your goal is less than:         HDL (Good cholesterol):       Your goal is more than: Comments:  ______________________________________________________ Mammogram:        Normal _____  Not Normal _____ Comments:  ___________________________________________________________________ Hemoccult:        Normal _X____  Not normal _______ Comments:    _____________________________________________________________________ Other Tests:    We routinely do not discuss normal results over the telephone.  If you desire a copy of the results, or you have any questions about this information we can discuss them at your next office visit.   Sincerely,

## 2010-06-16 NOTE — Consult Note (Signed)
Summary: Two Pages Only/[facility]  Two Pages Only/[facility]   Imported By: Edmonia James 04/30/2010 09:36:47  _____________________________________________________________________  External Attachment:    Type:   Image     Comment:   External Document

## 2010-09-23 ENCOUNTER — Other Ambulatory Visit: Payer: Self-pay | Admitting: Internal Medicine

## 2010-09-23 MED ORDER — FUROSEMIDE 40 MG PO TABS: 40.0000 mg | ORAL_TABLET | Freq: Every day | ORAL | Status: DC

## 2010-12-14 ENCOUNTER — Encounter: Payer: Self-pay | Admitting: Internal Medicine

## 2010-12-15 ENCOUNTER — Encounter: Payer: Self-pay | Admitting: Internal Medicine

## 2010-12-15 ENCOUNTER — Ambulatory Visit (INDEPENDENT_AMBULATORY_CARE_PROVIDER_SITE_OTHER): Payer: Managed Care, Other (non HMO) | Admitting: Internal Medicine

## 2010-12-15 DIAGNOSIS — I10 Essential (primary) hypertension: Secondary | ICD-10-CM

## 2010-12-15 DIAGNOSIS — R9431 Abnormal electrocardiogram [ECG] [EKG]: Secondary | ICD-10-CM

## 2010-12-15 DIAGNOSIS — M109 Gout, unspecified: Secondary | ICD-10-CM

## 2010-12-15 DIAGNOSIS — Z23 Encounter for immunization: Secondary | ICD-10-CM

## 2010-12-15 DIAGNOSIS — IMO0001 Reserved for inherently not codable concepts without codable children: Secondary | ICD-10-CM

## 2010-12-15 DIAGNOSIS — Z0000 Encounter for general adult medical examination without abnormal findings: Secondary | ICD-10-CM

## 2010-12-15 LAB — MICROALBUMIN / CREATININE URINE RATIO
Creatinine,U: 162.5 mg/dL
Microalb, Ur: 3.8 mg/dL — ABNORMAL HIGH (ref 0.0–1.9)

## 2010-12-15 LAB — HEPATIC FUNCTION PANEL
ALT: 16 U/L (ref 0–53)
AST: 23 U/L (ref 0–37)
Albumin: 4.5 g/dL (ref 3.5–5.2)
Alkaline Phosphatase: 41 U/L (ref 39–117)
Total Protein: 7.4 g/dL (ref 6.0–8.3)

## 2010-12-15 LAB — CBC WITH DIFFERENTIAL/PLATELET
Eosinophils Relative: 1.9 % (ref 0.0–5.0)
Monocytes Relative: 8.9 % (ref 3.0–12.0)
Neutrophils Relative %: 64.3 % (ref 43.0–77.0)
Platelets: 169 10*3/uL (ref 150.0–400.0)
WBC: 5.9 10*3/uL (ref 4.5–10.5)

## 2010-12-15 LAB — BASIC METABOLIC PANEL
BUN: 27 mg/dL — ABNORMAL HIGH (ref 6–23)
Creatinine, Ser: 1.5 mg/dL (ref 0.4–1.5)
GFR: 59.04 mL/min — ABNORMAL LOW (ref >60.00)
Potassium: 4.1 mEq/L (ref 3.5–5.1)

## 2010-12-15 LAB — LIPID PANEL
HDL: 50.6 mg/dL (ref >39.00)
Triglycerides: 40 mg/dL (ref 0.0–149.0)

## 2010-12-15 MED ORDER — TETANUS-DIPHTH-ACELL PERTUSSIS 5-2.5-18.5 LF-MCG/0.5 IM SUSP
0.5000 mL | Freq: Once | INTRAMUSCULAR | Status: AC
  Administered 2010-12-15: 0.5 mL via INTRAMUSCULAR

## 2010-12-15 NOTE — Progress Notes (Signed)
Subjective:    Patient ID: Corey [name], male    DOB: Sep 25, 1945, 65 y.o.   MRN: [id number]  HPI  Corey [name]  is here for a physical; he denies acute issues.      Review of Systems #1 HYPERTENSION: Disease Monitoring: Blood pressure range-not checked @ home; 150/? @ drug store  Chest pain, palpitations- no       Dyspnea- 1 month ago, resolved w/o Rx Medications: Compliance- yes  Lightheadedness,Syncope- no    Edema- no  #2 DIABETES: Corey [name] last seen ? Disease Monitoring: Blood Sugar ranges-120 on average  Polyuria/phagia/dipsia- no       Visual problems- no; last exam 1 year ago by Corey [name] Medications: Compliance- yes  Hypoglycemic symptoms- rare  #3 HYPERLIPIDEMIA: no elevations in past  Medications: Compliance- ran out of Actos this week; not on statin  Abd pain, bowel changes- no   Muscle aches- no  He has a past history of  colon polyps w/o follow up. He denies abdominal pain, rectal bleeding, melena, or weight loss. He has no dysuria, hematuria, or pyuria. He's had no recent gout attacks.            Objective:   Physical Exam Gen.: In no distress. Alert, appropriate and cooperative throughout exam. Head: Normocephalic without obvious abnormalities;  pattern alopecia  Eyes: No corneal or conjunctival inflammation noted. Pupils equal round reactive to light and accommodation. Fundal exam difficult OD; arteriolar narrowing OS.Decreased vision OS even with lens. Extraocular motion intact.  Ears: External  ear exam reveals no significant lesions or deformities. Canals clear .TMs normal. Hearing is decreased bilaterally; L > R. Nose: External nasal exam reveals no deformity or inflammation. Nasal mucosa are pink and moist. No lesions or exudates noted. Mouth: Oral mucosa and oropharynx reveal no lesions or exudates. Teeth in poor  repair. Neck: No deformities, masses, or tenderness noted. Range of motion &. Thyroid  normal. Lungs: Normal respiratory effort;  chest expands symmetrically. Lungs are clear to auscultation without rales, wheezes, or increased work of breathing. Heart: Normal rate and rhythm. Normal S1 and S2. No gallop, click, or rub. Grade 1/6 systolic apical  murmur. Abdomen: Bowel sounds normal; abdomen soft and nontender. No masses, organomegaly .Ventral  Hernia  noted. Genitalia/DRE:   Varicocele present; prostate mildly enlarged and firm without definite nodules. .                                                                                   Musculoskeletal/extremities: No deformity or scoliosis noted of  the thoracic or lumbar spine. No clubbing, cyanosis, edema. Flexion  Deformity 5th DIP bilaterally noted. Bunions on toes.Tone & strength  normal. Toe nails thickened & discolored Vascular: Carotid, radial artery, dorsalis pedis and  posterior tibial pulses are full and equal. No bruits present. Neurologic: Alert and oriented x3. Deep tendon reflexes symmetrical and normal.  Light touch normal over feet.        Skin: Intact without suspicious lesions or rashes. Lymph: No cervical, axillary, or inguinal lymphadenopathy present. Psych: Mood and affect are normal. Normally interactive  Assessment & Plan:  #1 comprehensive physical exam; no acute findings #2 see Problem List with Assessments & Recommendations  #3 past medical history colon polyps in the colon no followup today  #4 mild BPH; family history of prostate cancer. Plan: see Orders

## 2010-12-15 NOTE — Patient Instructions (Signed)
Preventive Health Care: Exercise at least 30-45 minutes a day,  3-4 days a week.  Eye Doctor - have an eye exam @ least annually.                                                         Health Care Power of [place]. Complete if not in place ; these place you in charge of your health care decisions. Eat a low-fat diet with lots of fruits and vegetables, up to 7-9 servings per day. Avoid obesity; your goal is waist measurement < 40 inches.Consume less than 40 grams of sugar per day from foods & drinks with High Fructose Corn Sugar as #1,2,3 or # 4 on label. Follow the low carb nutrition program in [place] as closely as possible to prevent Diabetes progression & complications. White carbohydrates (potatoes, rice, bread, and pasta) have a high spike of sugar and a high load of sugar. For example a  baked potato has a cup of sugar and a  french fry  2 teaspoons of sugar. Yams, wild  rice, whole grained bread &  wheat pasta have been much lower spike and load of  sugar. Portions should be the size of a deck of cards or your palm.  As per the Standard of Care , screening Colonoscopy recommended @ 50 & every 5-10 years thereafter . More frequent monitor would be dictated by family history or findings @ Colonoscopy . Your follow up was due in 2008.

## 2010-12-16 ENCOUNTER — Encounter: Payer: Self-pay | Admitting: Internal Medicine

## 2010-12-25 ENCOUNTER — Other Ambulatory Visit: Payer: Self-pay | Admitting: Internal Medicine

## 2010-12-27 ENCOUNTER — Encounter: Payer: Self-pay | Admitting: Internal Medicine

## 2011-01-19 ENCOUNTER — Ambulatory Visit (INDEPENDENT_AMBULATORY_CARE_PROVIDER_SITE_OTHER): Payer: PRIVATE HEALTH INSURANCE | Admitting: Endocrinology

## 2011-01-19 ENCOUNTER — Encounter: Payer: Self-pay | Admitting: Endocrinology

## 2011-01-19 VITALS — BP 156/102 | HR 61 | Temp 99.0°F | Ht 66.0 in | Wt 223.2 lb

## 2011-01-19 NOTE — Progress Notes (Signed)
Subjective:    Patient ID: Corey [name], male    DOB: May 24, 1945, 65 y.o.   MRN: [id number]  HPI pt states 15 years h/o dm, complicated by renal insufficiency.  he has never been on insulin.  he takes 3 oral agents.  He says cbg's in am are in the low-100's.   pt says his diet is not good, but exercise is good.  Pt reports few years of moderate visual loss from the left eye (pt says due to glaucoma), but no assoc headache. He says the cost of a medication is very important to him, and he is willing to take insulin as often as 2x a day.   Past Medical History  Diagnosis Date  . Renal insufficiency   . Diabetes mellitus   . Hypertension   . Gout   . Colon polyp 2003    Dr [name]; F/U was to be 2008( not completed)  . Diverticulosis 2003    Past Surgical History  Procedure Date  . Refractive surgery 2005  . Colonoscopy w/ polypectomy 2003    History   Social History  . Marital Status: Married    Spouse Name: N/A    Number of Children: N/A  . Years of Education: N/A   Occupational History  . Not on file.   Social History Main Topics  . Smoking status: Former Smoker    Quit date: 05/15/1968  . Smokeless tobacco: Not on file   Comment: Quit at age 63   . Alcohol Use: 1.2 oz/week    2 Cans of beer per week  . Drug Use: No  . Sexually Active: Not on file   Other Topics Concern  . Not on file   Social History Narrative   No salt restriction      Current Outpatient Prescriptions on File Prior to Visit  Medication Sig Dispense Refill  . amLODipine (NORVASC) 10 MG tablet Take 10 mg by mouth daily.        . carvedilol (COREG) 25 MG tablet Take 25 mg by mouth 2 (two) times daily with a meal.        . furosemide (LASIX) 40 MG tablet Take 1 tablet (40 mg total) by mouth daily.  30 tablet  3  . glimepiride (AMARYL) 4 MG tablet Take 4 mg by mouth daily before breakfast.        . metFORMIN (GLUCOPHAGE-XR) 500 MG 24 hr tablet Take 500 mg by mouth 2 (two) times daily with a  meal.        . pioglitazone (ACTOS) 30 MG tablet Take 30 mg by mouth daily.          No Known Allergies  Family History  Problem Relation Age of Onset  . Hypertension Father   . Hypertension Mother   . Cancer Father     prostate  . Diabetes Mother   . Diabetes Father     BP 156/102  Pulse 61  Temp(Src) 99 F (37.2 C) (Oral)  Ht 5\' 6"  (1.676 m)  Wt 223 lb 3.2 oz (101.243 kg)  BMI 36.03 kg/m2  SpO2 96%  Review of Systems denies blurry vision, seizure, chest pain, n/v, urinary frequency, cramps, excessive diaphoresis, memory loss, depression, hypoglycemia, rhinorrhea, and easy bruising.  He reports weight gain and slight doe.      Objective:   Physical Exam VS: see vs page GEN: no distress HEAD: head: no deformity eyes: no periorbital swelling, no proptosis external nose and ears are  normal mouth: no lesion seen NECK: supple, thyroid is not enlarged CHEST WALL: no deformity LUNGS: clear to auscultation CV: reg rate and rhythm.  There is a soft systolic murmur ABD: abdomen is soft, nontender.  no hepatosplenomegaly.  not distended.  There is a self-reducing ventral hernia MUSCULOSKELETAL: muscle bulk and strength are grossly normal.  no obvious joint swelling.  gait is normal and steady EXTEMITIES: no deformity.  no ulcer on the feet.  feet are of normal color and temp.  1+ bilat leg edema.   There is bilteral onychomycosis, and bilat "hammer toes." PULSES: dorsalis pedis intact bilat.  no carotid bruit NEURO:  cn 2-12 grossly intact.   readily moves all 4's.  sensation is intact to touch on the feet SKIN:  Normal texture and temperature.  No rash or suspicious lesion is visible.   NODES:  None palpable at the neck PSYCH: alert, oriented x3.  Does not appear anxious nor depressed. Lab Results  Component Value Date   HGBA1C 10.4* 12/15/2010      Assessment & Plan:  Dm, needs increased rx Edema, prob due to actos and norvasc Visual loss due to glaucoma--control of a1c  is more important in this situation Renal insuff, caused or exac by dm Htn, with ? Of situational component--we'll recheck upon return

## 2011-01-19 NOTE — Patient Instructions (Addendum)
good diet and exercise habits significanly improve the control of your diabetes.  please let me know if you wish to be referred to a dietician.  high blood sugar is very risky to your health.  you should see an eye doctor every year. controlling your blood pressure and cholesterol drastically reduces the damage diabetes does to your body.  this also applies to quitting smoking.  please discuss these with your doctor.  you should take an aspirin every day, unless you have been advised by a doctor not to. check your blood sugar 2 times a day.  vary the time of day when you check, between before the 3 meals, and at bedtime.  also check if you have symptoms of your blood sugar being too high or too low.  please keep a record of the readings and bring it to your next appointment here.  please call us sooner if you are having low blood sugar episodes. Here are some insulin pen samples.  you will receive a phone call, about a day and time for an appointment, to learn about the insulin injections.   Stop actos. Continue the other 2 diabetes pills for now, but we'll stop them at a future appointment. Please make a follow-up appointment in 2 weeks

## 2011-02-02 ENCOUNTER — Ambulatory Visit: Payer: PRIVATE HEALTH INSURANCE | Admitting: Endocrinology

## 2011-02-06 LAB — CREATININE, SERUM
Creatinine, Ser: 1.43
GFR calc non Af Amer: 51 — ABNORMAL LOW

## 2011-02-09 ENCOUNTER — Other Ambulatory Visit: Payer: Self-pay | Admitting: Internal Medicine

## 2011-02-14 ENCOUNTER — Encounter: Payer: Self-pay | Admitting: Endocrinology

## 2011-02-14 ENCOUNTER — Ambulatory Visit (INDEPENDENT_AMBULATORY_CARE_PROVIDER_SITE_OTHER): Payer: Managed Care, Other (non HMO) | Admitting: Endocrinology

## 2011-02-14 NOTE — Progress Notes (Signed)
  Subjective:    Patient ID: Corey [name], male    DOB: 03-Nov-1945, 65 y.o.   MRN: [id number]  HPI no cbg record, but states cbg's vary from 105-180.  There is no trend throughout the day.he takes insulin with evening meal.  pt states he feels well in general. Past Medical History  Diagnosis Date  . Renal insufficiency   . Diabetes mellitus   . Hypertension   . Gout   . Colon polyp 2003    Dr [name]; F/U was to be 2008( not completed)  . Diverticulosis 2003    Past Surgical History  Procedure Date  . Refractive surgery 2005  . Colonoscopy w/ polypectomy 2003    History   Social History  . Marital Status: Married    Spouse Name: N/A    Number of Children: N/A  . Years of Education: N/A   Occupational History  . Not on file.   Social History Main Topics  . Smoking status: Former Smoker    Quit date: 05/15/1968  . Smokeless tobacco: Not on file   Comment: Quit at age 66   . Alcohol Use: 1.2 oz/week    2 Cans of beer per week  . Drug Use: No  . Sexually Active: Not on file   Other Topics Concern  . Not on file   Social History Narrative   No salt restriction      Current Outpatient Prescriptions on File Prior to Visit  Medication Sig Dispense Refill  . amLODipine (NORVASC) 10 MG tablet TAKE ONE TABLET BY MOUTH EVERY DAY  30 tablet  5  . carvedilol (COREG) 25 MG tablet Take 25 mg by mouth 2 (two) times daily with a meal.        . furosemide (LASIX) 40 MG tablet Take 1 tablet (40 mg total) by mouth daily.  30 tablet  3  . insulin lispro protamine-insulin lispro (HUMALOG MIX 75/25 KWIKPEN) (75-25) 100 UNIT/ML SUSP Inject 10 Units into the skin daily with breakfast. And pen needles 1/day       . metFORMIN (GLUCOPHAGE) 500 MG tablet TAKE ONE TABLET BY MOUTH TWICE DAILY WITH  TWO  LARGEST  MEALS  60 tablet  2    No Known Allergies  Family History  Problem Relation Age of Onset  . Hypertension Father   . Hypertension Mother   . Cancer Father     prostate  .  Diabetes Mother   . Diabetes Father    BP 142/98  Pulse 64  Temp(Src) 98.9 F (37.2 C) (Oral)  Ht 5\' 4"  (1.626 m)  Wt 222 lb 9.6 oz (100.971 kg)  BMI 38.21 kg/m2  SpO2 96%  Review of Systems denies hypoglycemia    Objective:   Physical Exam VITAL SIGNS:  See vs page GENERAL: no distress SKIN: Insulin injection sites at the anterior abdomen are normal    Assessment & Plan:  Dm.  He is ready to continue the transition from orals to insulin.

## 2011-02-14 NOTE — Patient Instructions (Addendum)
check your blood sugar 2 times a day.  vary the time of day when you check, between before the 3 meals, and at bedtime.  also check if you have symptoms of your blood sugar being too high or too low.  please keep a record of the readings and bring it to your next appointment here.  please call us sooner if you are having low blood sugar episodes. Please make a follow-up appointment in 2 weeks. Stop the glimepiride.   You should take the insulin with your first meal of the day.  Increase to 10 units per day for now.  Call if blood sugar is over 200.   Please see dr hopper to recheck your blood pressure.

## 2011-03-02 ENCOUNTER — Ambulatory Visit (INDEPENDENT_AMBULATORY_CARE_PROVIDER_SITE_OTHER): Payer: Managed Care, Other (non HMO) | Admitting: Endocrinology

## 2011-03-02 ENCOUNTER — Encounter: Payer: Self-pay | Admitting: Endocrinology

## 2011-03-02 VITALS — BP 162/102 | HR 59 | Temp 98.3°F | Ht 67.0 in | Wt 224.0 lb

## 2011-03-02 DIAGNOSIS — Z23 Encounter for immunization: Secondary | ICD-10-CM

## 2011-03-02 MED ORDER — ISOSORB DINITRATE-HYDRALAZINE 20-37.5 MG PO TABS: 1.0000 | ORAL_TABLET | Freq: Three times a day (TID) | ORAL | Status: DC

## 2011-03-02 NOTE — Progress Notes (Signed)
  Subjective:    Patient ID: Corey [name], male    DOB: 01-25-1946, 65 y.o.   MRN: [id number]  HPI The state of at least three ongoing medical problems is addressed today: DM: no cbg record, but states cbg's vary from 100-130.  It is in general higher in am.   Gout: no recent sxs HTN: hr denies headache. Past Medical History  Diagnosis Date  . Renal insufficiency   . Diabetes mellitus   . Hypertension   . Gout   . Colon polyp 2003    Dr [name]; F/U was to be 2008( not completed)  . Diverticulosis 2003    Past Surgical History  Procedure Date  . Refractive surgery 2005  . Colonoscopy w/ polypectomy 2003    History   Social History  . Marital Status: Married    Spouse Name: N/A    Number of Children: N/A  . Years of Education: N/A   Occupational History  . Not on file.   Social History Main Topics  . Smoking status: Former Smoker    Quit date: 05/15/1968  . Smokeless tobacco: Not on file   Comment: Quit at age 52   . Alcohol Use: 1.2 oz/week    2 Cans of beer per week  . Drug Use: No  . Sexually Active: Not on file   Other Topics Concern  . Not on file   Social History Narrative   No salt restriction      Current Outpatient Prescriptions on File Prior to Visit  Medication Sig Dispense Refill  . amLODipine (NORVASC) 10 MG tablet TAKE ONE TABLET BY MOUTH EVERY DAY  30 tablet  5  . carvedilol (COREG) 25 MG tablet Take 25 mg by mouth 2 (two) times daily with a meal.        . furosemide (LASIX) 40 MG tablet Take 1 tablet (40 mg total) by mouth daily.  30 tablet  3  . insulin lispro protamine-insulin lispro (HUMALOG MIX 75/25 KWIKPEN) (75-25) 100 UNIT/ML SUSP Inject 10 Units into the skin daily with breakfast. And pen needles 1/day         No Known Allergies  Family History  Problem Relation Age of Onset  . Hypertension Father   . Hypertension Mother   . Cancer Father     prostate  . Diabetes Mother   . Diabetes Father     BP 162/102  Pulse 59   Temp(Src) 98.3 F (36.8 C) (Oral)  Ht 5\' 7"  (1.702 m)  Wt 224 lb (101.606 kg)  BMI 35.08 kg/m2  SpO2 94%   Review of Systems denies hypoglycemia and weight change.    Objective:   Physical Exam VITAL SIGNS:  See vs page GENERAL: no distress PSYCH: Alert and oriented x 3.  Does not appear anxious nor depressed.      Assessment & Plan:  DM:  He is ready to continue to transition from orals to insulin only, to simplify his regimen. Htn, needs increased rx Renal insufficiency limits rx options for dm Gout also limits rx options for htn

## 2011-03-02 NOTE — Patient Instructions (Addendum)
check your blood sugar 2 times a day.  vary the time of day when you check, between before the 3 meals, and at bedtime.  also check if you have symptoms of your blood sugar being too high or too low.  please keep a record of the readings and bring it to your next appointment here.  please call us sooner if you are having low blood sugar episodes. Stop metformin.  Then increase the insulin if necessary, to keep the blood sugar in the low-100's.   Please make a follow-up appointment in 6 weeks.   You should take the insulin with your first meal of the day.  Increase to 10 units per day for now.  Call if blood sugar is over 200.   Please see dr hopper soon, to recheck your blood pressure. i have sent a prescription to your pharmacy, for an additional blood pressure medication.

## 2011-03-07 ENCOUNTER — Telehealth: Payer: Self-pay | Admitting: *Deleted

## 2011-03-07 MED ORDER — HYDRALAZINE HCL 10 MG PO TABS: 10.0000 mg | ORAL_TABLET | Freq: Three times a day (TID) | ORAL | Status: DC

## 2011-03-07 NOTE — Telephone Encounter (Signed)
Pt informed of new rx and of MD's advisement.

## 2011-03-07 NOTE — Telephone Encounter (Signed)
Left message for pt to callback office.  

## 2011-03-07 NOTE — Telephone Encounter (Signed)
Pt left message stating that the new medication he was given at last week's OV is too expensive. Pt is requesting alternative medication-please advise

## 2011-03-07 NOTE — Telephone Encounter (Signed)
i changed to cheaper, and sent rx. Please see dr hopper soon.

## 2011-04-13 ENCOUNTER — Ambulatory Visit: Payer: Managed Care, Other (non HMO) | Admitting: Endocrinology

## 2011-04-28 ENCOUNTER — Ambulatory Visit (INDEPENDENT_AMBULATORY_CARE_PROVIDER_SITE_OTHER): Payer: Managed Care, Other (non HMO) | Admitting: Endocrinology

## 2011-04-28 ENCOUNTER — Encounter: Payer: Self-pay | Admitting: Endocrinology

## 2011-04-28 NOTE — Patient Instructions (Addendum)
check your blood sugar 2 times a day.  vary the time of day when you check, between before the 3 meals, and at bedtime.  also check if you have symptoms of your blood sugar being too high or too low.  please keep a record of the readings and bring it to your next appointment here.  please call us sooner if you are having low blood sugar episodes. Please continue the same insulin for now.   Please make a follow-up appointment in 2 months.   You should take the insulin with your first meal of the day.

## 2011-04-28 NOTE — Progress Notes (Signed)
  Subjective:    Patient ID: Corey [name], male    DOB: [birth year], 65 y.o.   MRN: [id number]  HPI Pt returns for f/u of insulin-requiring DM (1997).  It is complicated by renal insufficiency.  He says he is willing take insulin as often as twice a day.  He takes 10 units qam, and is off orals.  no cbg record, but states cbg's are in the low-100's.   Past Medical History  Diagnosis Date  . Renal insufficiency   . Diabetes mellitus   . Hypertension   . Gout   . Colon polyp 2003    Dr [name]; F/U was to be 2008( not completed)  . Diverticulosis 2003    Past Surgical History  Procedure Date  . Refractive surgery 2005  . Colonoscopy w/ polypectomy 2003    History   Social History  . Marital Status: Married    Spouse Name: N/A    Number of Children: N/A  . Years of Education: N/A   Occupational History  . Not on file.   Social History Main Topics  . Smoking status: Former Smoker    Quit date: 05/15/1968  . Smokeless tobacco: Not on file   Comment: Quit at age 62   . Alcohol Use: 1.2 oz/week    2 Cans of beer per week  . Drug Use: No  . Sexually Active: Not on file   Other Topics Concern  . Not on file   Social History Narrative   No salt restriction      Current Outpatient Prescriptions on File Prior to Visit  Medication Sig Dispense Refill  . amLODipine (NORVASC) 10 MG tablet TAKE ONE TABLET BY MOUTH EVERY DAY  30 tablet  5  . carvedilol (COREG) 25 MG tablet Take 25 mg by mouth 2 (two) times daily with a meal.        . furosemide (LASIX) 40 MG tablet Take 1 tablet (40 mg total) by mouth daily.  30 tablet  3  . hydrALAZINE (APRESOLINE) 10 MG tablet Take 1 tablet (10 mg total) by mouth 3 (three) times daily.  90 tablet  0  . insulin lispro protamine-insulin lispro (HUMALOG MIX 75/25 KWIKPEN) (75-25) 100 UNIT/ML SUSP Inject 10 Units into the skin daily with breakfast. And pen needles 1/day         No Known Allergies  Family History  Problem Relation Age of  Onset  . Hypertension Father   . Hypertension Mother   . Cancer Father     prostate  . Diabetes Mother   . Diabetes Father    BP 124/80  Pulse 92  Temp(Src) 98.7 F (37.1 C) (Oral)  SpO2 96%  Review of Systems denies hypoglycemia    Objective:   Physical Exam VITAL SIGNS:  See vs page GENERAL: no distress Pulses: dorsalis pedis intact bilat.   Feet: there are several overlapping toes bilaterlly.  no ulcer on the feet.  feet are of normal color and temp.  2+ bilat leg edema.  There is bilateral onychomycosis, and dry skin on the feet.   Neuro: sensation is intact to touch on the feet     Assessment & Plan:  DM, improved

## 2011-06-30 ENCOUNTER — Ambulatory Visit (INDEPENDENT_AMBULATORY_CARE_PROVIDER_SITE_OTHER): Payer: Managed Care, Other (non HMO) | Admitting: Endocrinology

## 2011-06-30 ENCOUNTER — Encounter: Payer: Self-pay | Admitting: Endocrinology

## 2011-06-30 ENCOUNTER — Other Ambulatory Visit (INDEPENDENT_AMBULATORY_CARE_PROVIDER_SITE_OTHER): Payer: Managed Care, Other (non HMO)

## 2011-06-30 VITALS — BP 142/96 | HR 66 | Temp 97.8°F | Ht 67.0 in | Wt 225.0 lb

## 2011-06-30 DIAGNOSIS — E1029 Type 1 diabetes mellitus with other diabetic kidney complication: Secondary | ICD-10-CM

## 2011-06-30 DIAGNOSIS — E1065 Type 1 diabetes mellitus with hyperglycemia: Secondary | ICD-10-CM

## 2011-06-30 DIAGNOSIS — E108 Type 1 diabetes mellitus with unspecified complications: Secondary | ICD-10-CM | POA: Insufficient documentation

## 2011-06-30 DIAGNOSIS — N058 Unspecified nephritic syndrome with other morphologic changes: Secondary | ICD-10-CM

## 2011-06-30 DIAGNOSIS — E119 Type 2 diabetes mellitus without complications: Secondary | ICD-10-CM | POA: Insufficient documentation

## 2011-06-30 LAB — HEMOGLOBIN A1C: Hgb A1c MFr Bld: 8.7 % — ABNORMAL HIGH (ref 4.6–6.5)

## 2011-06-30 NOTE — Patient Instructions (Addendum)
check your blood sugar 2 times a day.  vary the time of day when you check, between before the 3 meals, and at bedtime.  also check if you have symptoms of your blood sugar being too high or too low.  please keep a record of the readings and bring it to your next appointment here.  please call us sooner if you are having low blood sugar episodes. blood tests are being requested for you today.  please call [phone] to hear your test results.  You will be prompted to enter the 9-digit "MRN" number that appears at the top left of this page, followed by #.  Then you will hear the message. Please continue the same insulin for now.   Please make a follow-up appointment in 3 months.   (update: i left message on phone-tree:  Increase insulin to 15 qam)

## 2011-06-30 NOTE — Progress Notes (Signed)
  Subjective:    Patient ID: Corey [name], male    DOB: 17-Dec-1945, 66 y.o.   MRN: [id number]  HPI Pt returns for f/u of insulin-requiring DM (1997).   He is off oral dm meds.  He still takes 10 units qam.  no cbg record, but states cbg's are in the low-100's in am.  pt states he feels well in general. Past Medical History  Diagnosis Date  . Renal insufficiency   . Diabetes mellitus   . Hypertension   . Gout   . Colon polyp 2003    Dr [name]; F/U was to be 2008( not completed)  . Diverticulosis 2003    Past Surgical History  Procedure Date  . Refractive surgery 2005  . Colonoscopy w/ polypectomy 2003    History   Social History  . Marital Status: Married    Spouse Name: N/A    Number of Children: N/A  . Years of Education: N/A   Occupational History  . Not on file.   Social History Main Topics  . Smoking status: Former Smoker    Quit date: 05/15/1968  . Smokeless tobacco: Not on file   Comment: Quit at age 71   . Alcohol Use: 1.2 oz/week    2 Cans of beer per week  . Drug Use: No  . Sexually Active: Not on file   Other Topics Concern  . Not on file   Social History Narrative   No salt restriction      Current Outpatient Prescriptions on File Prior to Visit  Medication Sig Dispense Refill  . amLODipine (NORVASC) 10 MG tablet TAKE ONE TABLET BY MOUTH EVERY DAY  30 tablet  5  . carvedilol (COREG) 25 MG tablet Take 25 mg by mouth 2 (two) times daily with a meal.        . furosemide (LASIX) 40 MG tablet Take 1 tablet (40 mg total) by mouth daily.  30 tablet  3  . hydrALAZINE (APRESOLINE) 10 MG tablet Take 1 tablet (10 mg total) by mouth 3 (three) times daily.  90 tablet  0  . insulin lispro protamine-insulin lispro (HUMALOG MIX 75/25 KWIKPEN) (75-25) 100 UNIT/ML SUSP Inject 15 Units into the skin daily with breakfast. And pen needles 1/day        No Known Allergies  Family History  Problem Relation Age of Onset  . Hypertension Father   . Hypertension  Mother   . Cancer Father     prostate  . Diabetes Mother   . Diabetes Father     BP 142/96  Pulse 66  Temp(Src) 97.8 F (36.6 C) (Oral)  Ht 5\' 7"  (1.702 m)  Wt 225 lb (102.059 kg)  BMI 35.24 kg/m2  SpO2 97%    Review of Systems denies hypoglycemia    Objective:   Physical Exam VITAL SIGNS:  See vs page GENERAL: no distress Pulses: dorsalis pedis intact bilat.   Feet: no deformity.  no ulcer on the feet.  feet are of normal color and temp.  no edema.  There is bilateral onychomycosis, and overlapping toes. Neuro: sensation is intact to touch on the feet    Lab Results  Component Value Date   HGBA1C 8.7* 06/30/2011      Assessment & Plan:  DM, therapy limited by noncompliance with cbg monitoring.  i'll do the best i can.

## 2011-09-21 ENCOUNTER — Ambulatory Visit (INDEPENDENT_AMBULATORY_CARE_PROVIDER_SITE_OTHER): Payer: Managed Care, Other (non HMO) | Admitting: Internal Medicine

## 2011-09-21 VITALS — BP 166/98 | HR 55 | Temp 98.3°F | Wt 222.0 lb

## 2011-09-21 DIAGNOSIS — N058 Unspecified nephritic syndrome with other morphologic changes: Secondary | ICD-10-CM

## 2011-09-21 DIAGNOSIS — Z23 Encounter for immunization: Secondary | ICD-10-CM

## 2011-09-21 DIAGNOSIS — S61209A Unspecified open wound of unspecified finger without damage to nail, initial encounter: Secondary | ICD-10-CM

## 2011-09-21 DIAGNOSIS — E1029 Type 1 diabetes mellitus with other diabetic kidney complication: Secondary | ICD-10-CM

## 2011-09-21 DIAGNOSIS — I10 Essential (primary) hypertension: Secondary | ICD-10-CM

## 2011-09-21 MED ORDER — HYDRALAZINE HCL 25 MG PO TABS: 25.0000 mg | ORAL_TABLET | Freq: Three times a day (TID) | ORAL | Status: DC

## 2011-09-21 NOTE — Patient Instructions (Addendum)
Dip gauze in  sterile saline and applied to the wound twice a day. Cover the wound with Telfa , non stick dressing  without any antibiotic ointment. The saline can be purchased at the drugstore or you can make your own .Boil cup of salt in a gallon of water. Store mixture  in a clean container.Report Warning  signs as discussed (red streaks, pus, fever, increasing pain).  Blood Pressure Goal  Ideally is an AVERAGE < 135/85. This AVERAGE should be calculated from @ least 5-7 BP readings taken @ different times of day on different days of week. You should not respond to isolated BP readings , but rather the AVERAGE for that week

## 2011-09-21 NOTE — Progress Notes (Signed)
  Subjective:    Patient ID: Corey [name], male    DOB: [birth year], 66 y.o.   MRN: [id number]  HPI On 09/17/11 he awoke to note that his third left finger was swollen distally. There was no history of injury or trauma. By 5/8 it burst with the production of reddish and slightly yellow material. He denies any fever, chills, or sweats. He has had no other joint swelling or pain. He has not treated the finger except a bandage it.  He denies any vector exposure such as spider bite or tick bite. His tetanus shot is not up to date  He does have a past history of gout but this involved his toes    Review of Systems Fasting blood sugars are running 95-102 in the morning. He denies abdominal pain, nausea or vomiting  He checks his blood pressure at at his work. It has been elevated recently. The only number he remembers is a systolic of [id number]     Objective:   Physical Exam he appears healthy and well-nourished in no distress  He has no lymphadenopathy about the neck epitrochlear areas or axilla  Radial artery pulses are intact.  Nail health is good. The skin of the medial and ventral aspect of the third left finger has dehisced from the PIP joint to the pad of the finger. The underlying tissue appears normal with no suggestion of cellulitis.. At the ventral PIP joint there is linear ecchymosis.       Assessment & Plan:  #1 dehiscent of  epidermis without evidence of active infection.  #2 diabetes, good control is suggested by home glucose monitor  Plan: Hand surgery referral  for possible debridement and monitor

## 2011-09-28 ENCOUNTER — Emergency Department (HOSPITAL_COMMUNITY)
Admission: EM | Admit: 2011-09-28 | Discharge: 2011-09-29 | Disposition: A | Discharge status: Home / Self Care | Payer: Self-pay | Attending: Emergency Medicine | Admitting: Emergency Medicine

## 2011-09-28 ENCOUNTER — Emergency Department (HOSPITAL_COMMUNITY): Payer: Self-pay

## 2011-09-28 ENCOUNTER — Encounter (HOSPITAL_COMMUNITY): Payer: Self-pay | Admitting: Emergency Medicine

## 2011-09-28 DIAGNOSIS — E119 Type 2 diabetes mellitus without complications: Secondary | ICD-10-CM | POA: Insufficient documentation

## 2011-09-28 DIAGNOSIS — Z79899 Other long term (current) drug therapy: Secondary | ICD-10-CM | POA: Insufficient documentation

## 2011-09-28 DIAGNOSIS — I10 Essential (primary) hypertension: Secondary | ICD-10-CM | POA: Insufficient documentation

## 2011-09-28 DIAGNOSIS — J189 Pneumonia, unspecified organism: Secondary | ICD-10-CM | POA: Insufficient documentation

## 2011-09-28 MED ORDER — ACETAMINOPHEN 325 MG PO TABS
650.0000 mg | ORAL_TABLET | Freq: Once | ORAL | Status: AC
  Administered 2011-09-28: 650 mg via ORAL
  Filled 2011-09-28: qty 2

## 2011-09-28 NOTE — ED Notes (Signed)
PT. REPORTOS FEVER WITH SOB ONSET TODAY , DENIES COUGH OR CONGESTION , NO NAUSEA OR VOMITTING .

## 2011-09-29 ENCOUNTER — Ambulatory Visit: Payer: Managed Care, Other (non HMO) | Admitting: Endocrinology

## 2011-09-29 DIAGNOSIS — J189 Pneumonia, unspecified organism: Secondary | ICD-10-CM

## 2011-09-29 HISTORY — DX: Pneumonia, unspecified organism: J18.9

## 2011-09-29 LAB — DIFFERENTIAL
Basophils Absolute: 0 10*3/uL (ref 0.0–0.1)
Eosinophils Absolute: 0 10*3/uL (ref 0.0–0.7)
Lymphocytes Relative: 7 % — ABNORMAL LOW (ref 12–46)
Lymphs Abs: 0.7 10*3/uL (ref 0.7–4.0)
Monocytes Relative: 6 % (ref 3–12)
Neutro Abs: 8.9 10*3/uL — ABNORMAL HIGH (ref 1.7–7.7)
Neutrophils Relative %: 87 % — ABNORMAL HIGH (ref 43–77)

## 2011-09-29 LAB — BASIC METABOLIC PANEL
BUN: 28 mg/dL — ABNORMAL HIGH (ref 6–23)
Calcium: 10 mg/dL (ref 8.4–10.5)
Creatinine, Ser: 1.88 mg/dL — ABNORMAL HIGH (ref 0.50–1.35)
GFR calc Af Amer: 42 mL/min — ABNORMAL LOW (ref >90)

## 2011-09-29 LAB — URINALYSIS, ROUTINE W REFLEX MICROSCOPIC
Bilirubin Urine: NEGATIVE
Glucose, UA: NEGATIVE mg/dL
Ketones, ur: NEGATIVE mg/dL
Leukocytes, UA: NEGATIVE
Nitrite: NEGATIVE
Protein, ur: 30 mg/dL — AB

## 2011-09-29 LAB — CBC
Hemoglobin: 11.7 g/dL — ABNORMAL LOW (ref 13.0–17.0)
Platelets: 187 10*3/uL (ref 150–400)
RBC: 4.51 MIL/uL (ref 4.22–5.81)
WBC: 10.2 10*3/uL (ref 4.0–10.5)

## 2011-09-29 MED ORDER — ALBUTEROL SULFATE HFA 108 (90 BASE) MCG/ACT IN AERS
2.0000 | INHALATION_SPRAY | RESPIRATORY_TRACT | Status: DC | PRN
  Administered 2011-09-29: 2 via RESPIRATORY_TRACT
  Filled 2011-09-29: qty 6.7

## 2011-09-29 MED ORDER — ALBUTEROL SULFATE (5 MG/ML) 0.5% IN NEBU
2.5000 mg | INHALATION_SOLUTION | Freq: Once | RESPIRATORY_TRACT | Status: AC
  Administered 2011-09-29: 2.5 mg via RESPIRATORY_TRACT
  Filled 2011-09-29: qty 0.5

## 2011-09-29 MED ORDER — MOXIFLOXACIN HCL 400 MG PO TABS
400.0000 mg | ORAL_TABLET | Freq: Once | ORAL | Status: AC
  Administered 2011-09-29: 400 mg via ORAL
  Filled 2011-09-29: qty 1

## 2011-09-29 MED ORDER — MOXIFLOXACIN HCL 400 MG PO TABS: 400.0000 mg | ORAL_TABLET | Freq: Every day | ORAL | Status: AC

## 2011-09-29 NOTE — ED Notes (Signed)
Rx x 1, pt voiced understanding to use inhaler for shortness of breath and to finish abx rx.  Also voiced understanding to f/u with Dr. Linna Darner on Monday

## 2011-09-29 NOTE — Discharge Instructions (Signed)
Please take medications as prescribed. Return to the emergency department for worsening shortness of breath, cough, chest pain or other concerning symptoms. Take Tylenol every 4-6 hours for fevers. Followup with your doctor for recheck on Monday.  Pneumonia, Adult Pneumonia is an infection of the lungs.  CAUSES Pneumonia may be caused by bacteria or a virus. Usually, these infections are caused by breathing infectious particles into the lungs (respiratory tract). SYMPTOMS   Cough.   Fever.   Chest pain.   Increased rate of breathing.   Wheezing.   Mucus production.  DIAGNOSIS  If you have the common symptoms of pneumonia, your caregiver will typically confirm the diagnosis with a chest X-ray. The X-ray will show an abnormality in the lung (pulmonary infiltrate) if you have pneumonia. Other tests of your blood, urine, or sputum may be done to find the specific cause of your pneumonia. Your caregiver may also do tests (blood gases or pulse oximetry) to see how well your lungs are working. TREATMENT  Some forms of pneumonia may be spread to other people when you cough or sneeze. You may be asked to wear a mask before and during your exam. Pneumonia that is caused by bacteria is treated with antibiotic medicine. Pneumonia that is caused by the influenza virus may be treated with an antiviral medicine. Most other viral infections must run their course. These infections will not respond to antibiotics.  PREVENTION A pneumococcal shot (vaccine) is available to prevent a common bacterial cause of pneumonia. This is usually suggested for:  People over 76 years old.   Patients on chemotherapy.   People with chronic lung problems, such as bronchitis or emphysema.   People with immune system problems.  If you are over 65 or have a high risk condition, you may receive the pneumococcal vaccine if you have not received it before. In some countries, a routine influenza vaccine is also recommended.  This vaccine can help prevent some cases of pneumonia.You may be offered the influenza vaccine as part of your care. If you smoke, it is time to quit. You may receive instructions on how to stop smoking. Your caregiver can provide medicines and counseling to help you quit. HOME CARE INSTRUCTIONS   Cough suppressants may be used if you are losing too much rest. However, coughing protects you by clearing your lungs. You should avoid using cough suppressants if you can.   Your caregiver may have prescribed medicine if he or she thinks your pneumonia is caused by a bacteria or influenza. Finish your medicine even if you start to feel better.   Your caregiver may also prescribe an expectorant. This loosens the mucus to be coughed up.   Only take over-the-counter or prescription medicines for pain, discomfort, or fever as directed by your caregiver.   Do not smoke. Smoking is a common cause of bronchitis and can contribute to pneumonia. If you are a smoker and continue to smoke, your cough may last several weeks after your pneumonia has cleared.   A cold steam vaporizer or humidifier in your room or home may help loosen mucus.   Coughing is often worse at night. Sleeping in a semi-upright position in a recliner or using a couple pillows under your head will help with this.   Get rest as you feel it is needed. Your body will usually let you know when you need to rest.  SEEK IMMEDIATE MEDICAL CARE IF:   Your illness becomes worse. This is especially true if you  are elderly or weakened from any other disease.   You cannot control your cough with suppressants and are losing sleep.   You begin coughing up blood.   You develop pain which is getting worse or is uncontrolled with medicines.   You have a fever.   Any of the symptoms which initially brought you in for treatment are getting worse rather than better.   You develop shortness of breath or chest pain.  MAKE SURE YOU:   Understand  these instructions.   Will watch your condition.   Will get help right away if you are not doing well or get worse.  Document Released: 05/01/2005 Document Revised: 04/20/2011 Document Reviewed: 07/21/2010 [facility] Patient Information 2012 [place].

## 2011-09-29 NOTE — ED Provider Notes (Signed)
History     CSN: [id number]  Arrival date & time 09/28/11  2319   First MD Initiated Contact with Patient 09/29/11 0012      Chief Complaint  Patient presents with  . Fever    (Consider location/radiation/quality/duration/timing/severity/associated sxs/prior treatment) HPI 66 year old male presents to emergency room complaining of fever. Patient reports onset of fever earlier today. His wife reports patient was out mowing the lawn, and when he came in he looked unwell. Patient denies chest pain. He has had a slight cough. He has had increased urination but without any dysuria. Patient reports he has been "breathing hard". Patient with history of diabetes, is on steady insulin dosing. He has not checked his blood sugar in days to weeks as he has run out of testing strips. Denies abdominal pain no nausea no vomiting no headache Past Medical History  Diagnosis Date  . Renal insufficiency   . Diabetes mellitus   . Hypertension   . Gout   . Colon polyp 2003    Dr [name]; F/U was to be 2008( not completed)  . Diverticulosis 2003    Past Surgical History  Procedure Date  . Refractive surgery 2005  . Colonoscopy w/ polypectomy 2003    Family History  Problem Relation Age of Onset  . Hypertension Father   . Hypertension Mother   . Cancer Father     prostate  . Diabetes Mother   . Diabetes Father     History  Substance Use Topics  . Smoking status: Former Smoker    Quit date: 05/15/1968  . Smokeless tobacco: Not on file   Comment: Quit at age 78   . Alcohol Use: 1.2 oz/week    2 Cans of beer per week      Review of Systems  All other systems reviewed and are negative.    Allergies  Review of patient's allergies indicates no known allergies.  Home Medications   Current Outpatient Rx  Name Route Sig Dispense Refill  . AMLODIPINE BESYLATE 10 MG PO TABS  TAKE ONE TABLET BY MOUTH EVERY DAY 30 tablet 5  . CARVEDILOL 25 MG PO TABS Oral Take 25 mg by mouth 2  (two) times daily with a meal.      . HYDRALAZINE HCL 25 MG PO TABS Oral Take 1 tablet (25 mg total) by mouth 3 (three) times daily. 90 tablet 5  . INSULIN LISPRO PROT & LISPRO (75-25) 100 UNIT/ML [place] SUSP Subcutaneous Inject 15 Units into the skin daily with breakfast. And pen needles 1/day    . MOXIFLOXACIN HCL 400 MG PO TABS Oral Take 1 tablet (400 mg total) by mouth daily. 7 tablet 0    BP 137/86  Pulse 67  Temp(Src) 100 F (37.8 C) (Oral)  Resp 19  SpO2 95%  Physical Exam  Nursing note and vitals reviewed. Constitutional: He appears well-developed and well-nourished. No distress.       Patient noted to have fever of 103.1  HENT:  Head: Normocephalic and atraumatic.  Right Ear: External ear normal.  Left Ear: External ear normal.  Eyes: Conjunctivae and EOM are normal. Pupils are equal, round, and reactive to light.  Neck: Normal range of motion. Neck supple. No JVD present. No tracheal deviation present. No thyromegaly present.  Pulmonary/Chest: Effort normal. No stridor. No respiratory distress. He has no wheezes. He exhibits no tenderness.       Significant rhonchi noted in the lungs left greater than right  Abdominal: Soft. Bowel  sounds are normal. He exhibits no distension and no mass. There is no tenderness. There is no rebound and no guarding.  Musculoskeletal: Normal range of motion. He exhibits no edema and no tenderness.  Lymphadenopathy:    He has no cervical adenopathy.  Skin: Skin is warm and dry. No rash noted. He is not diaphoretic. No erythema. No pallor.    ED Course  Procedures (including critical care time)  Labs Reviewed  CBC - Abnormal; Notable for the following:    Hemoglobin 11.7 (*)    HCT 35.8 (*)    MCH 25.9 (*)    All other components within normal limits  DIFFERENTIAL - Abnormal; Notable for the following:    Neutrophils Relative 87 (*)    Neutro Abs 8.9 (*)    Lymphocytes Relative 7 (*)    All other components within normal limits  BASIC  METABOLIC PANEL - Abnormal; Notable for the following:    Glucose, Bld 178 (*)    BUN 28 (*)    Creatinine, Ser 1.88 (*)    GFR calc non Af Amer 36 (*)    GFR calc Af Amer 42 (*)    All other components within normal limits  URINALYSIS, ROUTINE W REFLEX MICROSCOPIC - Abnormal; Notable for the following:    Protein, ur 30 (*)    All other components within normal limits  URINE MICROSCOPIC-ADD ON  LAB REPORT - SCANNED   Dg Chest 2 View  09/29/2011  *RADIOLOGY REPORT*  Clinical Data: Shortness of breath.  Fever.  Hypertension.  CHEST - 2 VIEW  Comparison: None.  Findings: Marked cardiac enlargement with mild pulmonary vascular congestion.  No edema.  A linear atelectasis or infiltration in the left mid lung.  Retrocardiac infiltration is not excluded.  No blunting of costophrenic angles.  No pneumothorax. Prominent right paratracheal soft tissues likely representing prominent vascular shadows.  IMPRESSION: Marked cardiac enlargement with mild pulmonary vascular congestion. Linear atelectasis and possible infiltration on the left.  Original Report Authenticated By: Neale Burly, M.D.     1. Community acquired pneumonia       MDM  66 year old male with infiltrate noted on chest x-ray, suspect the chest x-ray is lagging behind his actual disease process as his lung exam shows significant rhonchi. Will start on antibiotics for community-acquired pneumonia. Patient informed he needs to get more testing strips and pay close attention to his sugars while he is ill with this pneumonia       Kalman Drape, MD 09/29/11 [phone]

## 2011-09-29 NOTE — ED Notes (Signed)
Pt c/o fever since this morning.  Some cough.  Denies CP, SOB, n/v/d.  Also states has had some urinary frequency without burning/pain.

## 2011-10-03 ENCOUNTER — Other Ambulatory Visit: Payer: Self-pay | Admitting: Endocrinology

## 2011-10-03 NOTE — Telephone Encounter (Signed)
Rx sent by Dr. Linna Darner on 09/21/2011

## 2011-10-05 ENCOUNTER — Encounter: Payer: Self-pay | Admitting: Internal Medicine

## 2011-10-05 ENCOUNTER — Ambulatory Visit (INDEPENDENT_AMBULATORY_CARE_PROVIDER_SITE_OTHER): Payer: Self-pay | Admitting: Internal Medicine

## 2011-10-05 VITALS — BP 140/82 | HR 61 | Temp 98.3°F | Wt 220.6 lb

## 2011-10-05 DIAGNOSIS — E1029 Type 1 diabetes mellitus with other diabetic kidney complication: Secondary | ICD-10-CM

## 2011-10-05 DIAGNOSIS — M109 Gout, unspecified: Secondary | ICD-10-CM

## 2011-10-05 DIAGNOSIS — N058 Unspecified nephritic syndrome with other morphologic changes: Secondary | ICD-10-CM

## 2011-10-05 DIAGNOSIS — E1065 Type 1 diabetes mellitus with hyperglycemia: Secondary | ICD-10-CM

## 2011-10-05 DIAGNOSIS — N259 Disorder resulting from impaired renal tubular function, unspecified: Secondary | ICD-10-CM

## 2011-10-05 DIAGNOSIS — I10 Essential (primary) hypertension: Secondary | ICD-10-CM

## 2011-10-05 DIAGNOSIS — D649 Anemia, unspecified: Secondary | ICD-10-CM

## 2011-10-05 MED ORDER — HYDRALAZINE HCL 25 MG PO TABS: 25.0000 mg | ORAL_TABLET | Freq: Three times a day (TID) | ORAL | Status: DC

## 2011-10-05 NOTE — Assessment & Plan Note (Addendum)
A1c has improved from 10.4% on 8/2/12to 8.7 % on 06/30/11. Goals and risks discussed.

## 2011-10-05 NOTE — Progress Notes (Signed)
  Subjective:    Patient ID: Corey [name], male    DOB: 1945/09/08, 66 y.o.   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 Up to a high of 175/?. He admits it is difficult to remember the diastolic blood pressure recordings. He has been out of his hydrochlorothiazide for one week  He denies chest pain, palpitations, significant dyspnea,or  edema. He does have some claudication symptoms intermittently. Rarely he has had paroxysmal nocturnal dyspnea. He was dyspneic earlier this month when he was treated for pneumonia as an outpatient.    Review of Systems He was seen in the ER 5/ 16/13 for possible pneumonia. Chest x-ray revealed marked cardiac enlargement with mild pulmonary vascular congestion. Linear atelectasis and possible infiltrate was noted on the left. White blood count was high normal at 10,200. No differential was performed. At this time he denies fever, chills, sweats, or purulent secretions  He was anemic; hematocrit was 35.8; this is a long-standing issue in the context of renal insufficiency.     Objective:   Physical Exam Gen.:  well-nourished in appearance. Alert, appropriate and cooperative throughout exam.  Eyes: No corneal or conjunctival inflammation noted.  Neck: No deformities, masses, or tenderness noted. Thyroid normal. Lungs: Normal respiratory effort; chest expands symmetrically. Mild bibasilar  Rales w/o wheezes, or increased work of breathing. Heart: Slow  rate and regular rhythm. Normal S1 and S2. No gallop, click, or rub. No murmur. Abdomen: Bowel sounds normal; abdomen soft and nontender. No masses, organomegaly or hernias noted. No AAA or RA bruits.                                                                                   Musculoskeletal/extremities: No clubbing, cyanosis,  or deformity noted. Trace lower shin edema.Nail health  good. Vascular: Carotid, radial  artery, dorsalis pedis and  posterior tibial pulses are full and equal. No bruits present. Neurologic: Alert and oriented x3.  Skin: Intact without suspicious lesions or rashes. Lymph: No cervical, axillary lymphadenopathy present. Psych: Mood and affect are normal. Normally interactive                                                                                         Assessment & Plan:

## 2011-10-05 NOTE — Assessment & Plan Note (Signed)
Hematocrit 35.8 on 09/29/11

## 2011-10-05 NOTE — Assessment & Plan Note (Addendum)
Uric acid was 8.214/7/11; 8.9 on 12/15/10. He denies active gouty symptoms. He is not on allopurinol which is contraindicated because of  his renal insufficiency

## 2011-10-05 NOTE — Patient Instructions (Signed)
Blood Pressure Goal  Ideally is an AVERAGE < 140/90; IDEALLY  135/85. This AVERAGE should be calculated from @ least 5-7 BP readings taken @ different times of day on different days of week. You should not respond to isolated BP readings , but rather the AVERAGE for that week

## 2011-10-05 NOTE — Assessment & Plan Note (Signed)
Creatinine 1.88 in the emergency room 09/29/11

## 2011-10-05 NOTE — Progress Notes (Signed)
  Subjective:    Patient ID: Corey [name], male    DOB: 07-Oct-1945, 66 y.o.   MRN: [id number]  HPI correction: He had been out of hydralazine 25 mg 3 times a day for one week, not hydrochlorothiazide.    Review of Systems     Objective:   Physical Exam        Assessment & Plan:

## 2011-10-05 NOTE — Assessment & Plan Note (Addendum)
Blood pressure elevated;he has been off his Hydralazine  for one week. Risk related to uncontrolled blood pressure even for short period times was graphically demonstrated. It was stressed that he must not run out of his  blood pressure medicines

## 2011-10-27 ENCOUNTER — Other Ambulatory Visit (INDEPENDENT_AMBULATORY_CARE_PROVIDER_SITE_OTHER): Payer: Self-pay

## 2011-10-27 ENCOUNTER — Encounter: Payer: Self-pay | Admitting: Endocrinology

## 2011-10-27 ENCOUNTER — Ambulatory Visit (INDEPENDENT_AMBULATORY_CARE_PROVIDER_SITE_OTHER): Payer: Self-pay | Admitting: Endocrinology

## 2011-10-27 VITALS — BP 132/72 | HR 53 | Temp 98.6°F | Wt 220.0 lb

## 2011-10-27 DIAGNOSIS — E1065 Type 1 diabetes mellitus with hyperglycemia: Secondary | ICD-10-CM

## 2011-10-27 DIAGNOSIS — E1029 Type 1 diabetes mellitus with other diabetic kidney complication: Secondary | ICD-10-CM

## 2011-10-27 DIAGNOSIS — N058 Unspecified nephritic syndrome with other morphologic changes: Secondary | ICD-10-CM

## 2011-10-27 NOTE — Progress Notes (Signed)
  Subjective:    Patient ID: Corey [name], male    DOB: [birth year], 66 y.o.   MRN: [id number]  HPI Pt returns for f/u of insulin-requiring DM (dx'ed 0000000; complicated by renal insufficiency).  He needs a simple (qd) insulin regimen.  no cbg record, but states cbg's are 88-118 in am.  He does not check later in the day.   Past Medical History  Diagnosis Date  . Renal insufficiency   . Diabetes mellitus   . Hypertension   . Gout   . Colon polyp 2003    Dr [name]; F/U was to be 2008( not completed)  . Diverticulosis 2003  . Pneumonia 09/29/11    Avelox X 10 daysas OP    Past Surgical History  Procedure Date  . Refractive surgery 2005  . Colonoscopy w/ polypectomy 2003    History   Social History  . Marital Status: Married    Spouse Name: N/A    Number of Children: N/A  . Years of Education: N/A   Occupational History  . Not on file.   Social History Main Topics  . Smoking status: Former Smoker    Quit date: 05/15/1968  . Smokeless tobacco: Not on file   Comment: Quit at age 42   . Alcohol Use: 1.2 oz/week    2 Cans of beer per week  . Drug Use: No  . Sexually Active: Not on file   Other Topics Concern  . Not on file   Social History Narrative   No salt restriction      Current Outpatient Prescriptions on File Prior to Visit  Medication Sig Dispense Refill  . amLODipine (NORVASC) 10 MG tablet TAKE ONE TABLET BY MOUTH EVERY DAY  30 tablet  5  . carvedilol (COREG) 25 MG tablet Take 25 mg by mouth 2 (two) times daily with a meal.        . hydrALAZINE (APRESOLINE) 25 MG tablet Take 1 tablet (25 mg total) by mouth 3 (three) times daily.  90 tablet  5  . insulin lispro protamine-insulin lispro (HUMALOG MIX 75/25 KWIKPEN) (75-25) 100 UNIT/ML SUSP Inject 20 Units into the skin daily with breakfast. And pen needles 1/day        Allergies  Allergen Reactions  . Hydrochlorothiazide     Gout , uncontrolled diabetes and renal insufficiency  . Allopurinol     Renal  insufficiency    Family History  Problem Relation Age of Onset  . Hypertension Father   . Hypertension Mother   . Cancer Father     prostate  . Diabetes Mother   . Diabetes Father     BP 132/72  Pulse 53  Temp 98.6 F (37 C) (Oral)  Wt 220 lb (99.791 kg)  SpO2 94%  Review of Systems denies hypoglycemia    Objective:   Physical Exam VITAL SIGNS:  See vs page GENERAL: no distress SKIN:  Insulin injection sites at the anterior abdomen are normal.  Lab Results  Component Value Date   HGBA1C 8.3* 10/27/2011      Assessment & Plan:  DM, therapy limited by pt's need for a simple regimen.  needs increased rx

## 2011-10-27 NOTE — Patient Instructions (Addendum)
check your blood sugar 2 times a day.  vary the time of day when you check, between before the 3 meals, and at bedtime.  also check if you have symptoms of your blood sugar being too high or too low.  please keep a record of the readings and bring it to your next appointment here.  please call us sooner if you are having low blood sugar episodes. Please make a follow-up appointment in 3 months.   blood tests are being requested for you today.  You will receive a letter with results.

## 2011-10-28 ENCOUNTER — Encounter: Payer: Self-pay | Admitting: Endocrinology

## 2011-10-31 ENCOUNTER — Telehealth: Payer: Self-pay | Admitting: *Deleted

## 2011-10-31 NOTE — Telephone Encounter (Signed)
Called pt to inform of lab results, left message for pt to callback office (letter also mailed to pt). 

## 2011-11-01 NOTE — Telephone Encounter (Signed)
Left message for pt to callback office.  

## 2011-11-02 NOTE — Telephone Encounter (Signed)
Called pt to inform of lab results, no answer/unable to leave message.

## 2012-01-01 ENCOUNTER — Ambulatory Visit: Payer: Self-pay | Admitting: Endocrinology

## 2012-01-06 ENCOUNTER — Other Ambulatory Visit: Payer: Self-pay | Admitting: Internal Medicine

## 2012-01-12 ENCOUNTER — Ambulatory Visit: Payer: Self-pay | Admitting: Endocrinology

## 2012-01-29 ENCOUNTER — Ambulatory Visit: Payer: Self-pay | Admitting: Endocrinology

## 2012-02-16 ENCOUNTER — Emergency Department (HOSPITAL_COMMUNITY)
Admission: EM | Admit: 2012-02-16 | Discharge: 2012-02-16 | Disposition: A | Discharge status: Nursing Home (Includes ICF) | Payer: 59 | Source: Home / Self Care | Attending: Family Medicine | Admitting: Family Medicine

## 2012-02-16 ENCOUNTER — Other Ambulatory Visit: Payer: Self-pay | Admitting: Internal Medicine

## 2012-02-16 ENCOUNTER — Emergency Department (HOSPITAL_COMMUNITY): Payer: 59

## 2012-02-16 ENCOUNTER — Encounter (HOSPITAL_COMMUNITY): Payer: Self-pay | Admitting: *Deleted

## 2012-02-16 ENCOUNTER — Observation Stay (HOSPITAL_COMMUNITY)
Admission: EM | Admit: 2012-02-16 | Discharge: 2012-02-17 | Disposition: A | Discharge status: Home / Self Care | Payer: 59 | Attending: Emergency Medicine | Admitting: Emergency Medicine

## 2012-02-16 DIAGNOSIS — I10 Essential (primary) hypertension: Secondary | ICD-10-CM | POA: Insufficient documentation

## 2012-02-16 DIAGNOSIS — L02519 Cutaneous abscess of unspecified hand: Principal | ICD-10-CM | POA: Insufficient documentation

## 2012-02-16 DIAGNOSIS — I169 Hypertensive crisis, unspecified: Secondary | ICD-10-CM

## 2012-02-16 DIAGNOSIS — L03113 Cellulitis of right upper limb: Secondary | ICD-10-CM

## 2012-02-16 DIAGNOSIS — N289 Disorder of kidney and ureter, unspecified: Secondary | ICD-10-CM | POA: Insufficient documentation

## 2012-02-16 DIAGNOSIS — M7989 Other specified soft tissue disorders: Secondary | ICD-10-CM

## 2012-02-16 DIAGNOSIS — L03119 Cellulitis of unspecified part of limb: Secondary | ICD-10-CM

## 2012-02-16 DIAGNOSIS — M109 Gout, unspecified: Secondary | ICD-10-CM

## 2012-02-16 DIAGNOSIS — E119 Type 2 diabetes mellitus without complications: Secondary | ICD-10-CM | POA: Insufficient documentation

## 2012-02-16 HISTORY — PX: OTHER SURGICAL HISTORY: SHX169

## 2012-02-16 LAB — CBC WITH DIFFERENTIAL/PLATELET
Basophils Absolute: 0 10*3/uL (ref 0.0–0.1)
Eosinophils Relative: 0 % (ref 0–5)
HCT: 39.1 % (ref 39.0–52.0)
Hemoglobin: 13.3 g/dL (ref 13.0–17.0)
Lymphocytes Relative: 12 % (ref 12–46)
MCV: 79.5 fL (ref 78.0–100.0)
Monocytes Absolute: 1.1 10*3/uL — ABNORMAL HIGH (ref 0.1–1.0)
Monocytes Relative: 13 % — ABNORMAL HIGH (ref 3–12)
Neutro Abs: 6.4 10*3/uL (ref 1.7–7.7)
RDW: 14.2 % (ref 11.5–15.5)
WBC: 8.6 10*3/uL (ref 4.0–10.5)

## 2012-02-16 LAB — COMPREHENSIVE METABOLIC PANEL
BUN: 23 mg/dL (ref 6–23)
CO2: 22 mEq/L (ref 19–32)
Calcium: 10.7 mg/dL — ABNORMAL HIGH (ref 8.4–10.5)
Chloride: 103 mEq/L (ref 96–112)
Creatinine, Ser: 1.63 mg/dL — ABNORMAL HIGH (ref 0.50–1.35)
GFR calc Af Amer: 50 mL/min — ABNORMAL LOW (ref >90)
GFR calc non Af Amer: 43 mL/min — ABNORMAL LOW (ref >90)
Glucose, Bld: 132 mg/dL — ABNORMAL HIGH (ref 70–99)
Total Bilirubin: 1 mg/dL (ref 0.3–1.2)

## 2012-02-16 MED ORDER — SODIUM CHLORIDE 0.9 % IV SOLN
Freq: Once | INTRAVENOUS | Status: AC
  Administered 2012-02-16: 23:00:00 via INTRAVENOUS

## 2012-02-16 MED ORDER — IBUPROFEN 800 MG PO TABS
800.0000 mg | ORAL_TABLET | Freq: Once | ORAL | Status: AC
  Administered 2012-02-16: 800 mg via ORAL
  Filled 2012-02-16: qty 1

## 2012-02-16 MED ORDER — VANCOMYCIN HCL IN DEXTROSE 1-5 GM/200ML-% IV SOLN
1000.0000 mg | Freq: Two times a day (BID) | INTRAVENOUS | Status: DC
  Filled 2012-02-16: qty 200

## 2012-02-16 MED ORDER — HYDRALAZINE HCL 25 MG PO TABS
25.0000 mg | ORAL_TABLET | Freq: Three times a day (TID) | ORAL | Status: DC
  Administered 2012-02-16 – 2012-02-17 (×2): 25 mg via ORAL
  Filled 2012-02-16 (×4): qty 1

## 2012-02-16 MED ORDER — ONDANSETRON HCL 4 MG/2ML IJ SOLN: 4.0000 mg | Freq: Four times a day (QID) | INTRAMUSCULAR | Status: DC | PRN

## 2012-02-16 MED ORDER — PIPERACILLIN-TAZOBACTAM 3.375 G IVPB
3.3750 g | Freq: Three times a day (TID) | INTRAVENOUS | Status: DC
  Administered 2012-02-17 (×2): 3.375 g via INTRAVENOUS
  Filled 2012-02-16 (×2): qty 50

## 2012-02-16 MED ORDER — AMLODIPINE BESYLATE 10 MG PO TABS
10.0000 mg | ORAL_TABLET | Freq: Every day | ORAL | Status: DC
Start: 2012-02-17 — End: 2012-02-17

## 2012-02-16 MED ORDER — OXYCODONE-ACETAMINOPHEN 5-325 MG PO TABS
2.0000 | ORAL_TABLET | Freq: Once | ORAL | Status: AC
  Administered 2012-02-16: 2 via ORAL
  Filled 2012-02-16: qty 2

## 2012-02-16 MED ORDER — ACETAMINOPHEN 325 MG PO TABS
650.0000 mg | ORAL_TABLET | Freq: Once | ORAL | Status: AC
  Administered 2012-02-16: 650 mg via ORAL

## 2012-02-16 MED ORDER — CEFTRIAXONE SODIUM 1 G IJ SOLR
INTRAMUSCULAR | Status: AC
  Filled 2012-02-16: qty 10

## 2012-02-16 MED ORDER — CEFTRIAXONE SODIUM 1 G IJ SOLR
1.0000 g | Freq: Once | INTRAMUSCULAR | Status: AC
  Administered 2012-02-16: 1 g via INTRAMUSCULAR

## 2012-02-16 MED ORDER — ACETAMINOPHEN 325 MG PO TABS
ORAL_TABLET | ORAL | Status: AC
  Filled 2012-02-16: qty 2

## 2012-02-16 MED ORDER — SODIUM CHLORIDE 0.9 % IV SOLN
1000.0000 mL | INTRAVENOUS | Status: DC
  Administered 2012-02-16: 1000 mL via INTRAVENOUS

## 2012-02-16 MED ORDER — VANCOMYCIN HCL IN DEXTROSE 1-5 GM/200ML-% IV SOLN
1000.0000 mg | Freq: Once | INTRAVENOUS | Status: AC
  Administered 2012-02-16: 1000 mg via INTRAVENOUS
  Filled 2012-02-16: qty 200

## 2012-02-16 MED ORDER — ACETAMINOPHEN 325 MG PO TABS: 650.0000 mg | ORAL_TABLET | ORAL | Status: DC | PRN

## 2012-02-16 MED ORDER — INSULIN ASPART 100 UNIT/ML ~~LOC~~ SOLN: 0.0000 [IU] | Freq: Three times a day (TID) | SUBCUTANEOUS | Status: DC

## 2012-02-16 MED ORDER — FUROSEMIDE 20 MG PO TABS
40.0000 mg | ORAL_TABLET | Freq: Every day | ORAL | Status: DC
Start: 2012-02-17 — End: 2012-02-17
  Administered 2012-02-17: 40 mg via ORAL
  Filled 2012-02-16: qty 2

## 2012-02-16 MED ORDER — MORPHINE SULFATE 4 MG/ML IJ SOLN
4.0000 mg | INTRAMUSCULAR | Status: DC | PRN
Start: 2012-02-16 — End: 2012-02-17

## 2012-02-16 MED ORDER — CARVEDILOL 25 MG PO TABS: 25.0000 mg | ORAL_TABLET | Freq: Two times a day (BID) | ORAL | Status: DC

## 2012-02-16 NOTE — ED Provider Notes (Signed)
History     CSN: [id number]  Arrival date & time 02/16/12  1647   First MD Initiated Contact with Patient 02/16/12 1647      Chief Complaint  Patient presents with  . Hand Problem    (Consider location/radiation/quality/duration/timing/severity/associated sxs/prior treatment) HPI Comments: 66 year old right-handed, poorly controlled diabetic (last hemoglobin A1c above 8 in June/2013) also with history of hypertension among other comorbidities. Here complaining of right hand swelling and pain for one day, denies chest pain or shortest of breath. Denies known trauma. Denies puncture wounds. States that swelling developed abruptly. Denies chills despite that his temperature is 102.33F here. Has not taken his blood pressure medications today. Denies headache or dizziness. Patient has had a carpal tunnel syndrome surgery on the affected wrist in the past. State if he cannot straighten or bend his fingers due to swelling and pain. Patient works part-time in a [employer].    Past Medical History  Diagnosis Date  . Renal insufficiency   . Diabetes mellitus   . Hypertension   . Gout   . Colon polyp 2003    Dr [name]; F/U was to be 2008( not completed)  . Diverticulosis 2003  . Pneumonia 09/29/11    Avelox X 10 daysas OP    Past Surgical History  Procedure Date  . Refractive surgery 2005  . Colonoscopy w/ polypectomy 2003    Family History  Problem Relation Age of Onset  . Hypertension Father   . Hypertension Mother   . Cancer Father     prostate  . Diabetes Mother   . Diabetes Father     History  Substance Use Topics  . Smoking status: Former Smoker    Quit date: 05/15/1968  . Smokeless tobacco: Not on file   Comment: Quit at age 23   . Alcohol Use: 1.2 oz/week    2 Cans of beer per week      Review of Systems  Constitutional: Positive for fever.  Respiratory: Negative for cough, chest tightness, shortness of breath and wheezing.   Gastrointestinal:  Negative for nausea, vomiting, abdominal pain and diarrhea.  Skin: Negative for wound.       Right hand :Swelling, redness and pain    Allergies  Hydrochlorothiazide and Allopurinol  Home Medications   Current Outpatient Rx  Name Route Sig Dispense Refill  . AMLODIPINE BESYLATE 10 MG PO TABS  TAKE ONE TABLET BY MOUTH EVERY DAY 30 tablet 5  . CARVEDILOL 25 MG PO TABS  TAKE ONE TABLET BY MOUTH TWICE DAILY 180 tablet 1  . FUROSEMIDE 40 MG PO TABS  TAKE ONE TABLET BY MOUTH EVERY DAY 30 tablet 5  . HYDRALAZINE HCL 25 MG PO TABS Oral Take 1 tablet (25 mg total) by mouth 3 (three) times daily. 90 tablet 5  . INSULIN LISPRO PROT & LISPRO (75-25) 100 UNIT/ML [place] SUSP Subcutaneous Inject 20 Units into the skin daily with breakfast. And pen needles 1/day      BP 215/105  Pulse 83  Temp 102.4 F (39.1 C) (Oral)  Resp 18  SpO2 95%  Physical Exam  Nursing note and vitals reviewed. Constitutional: He is oriented to person, place, and time. He appears well-developed and well-nourished. No distress.       Calmed sitting in the exam table.  Eyes: Conjunctivae normal are normal. No scleral icterus.  Neck: Neck supple. No JVD present.  Cardiovascular: Normal rate, regular rhythm and normal heart sounds.   Pulmonary/Chest: Effort normal and breath  sounds normal. He exhibits no tenderness.       Initially impressed right lower lung crackles but wears short lived and disappear after subsequent deep inspiration. No tachypnea, no orthopnea. No wheezing or rhonchi  Musculoskeletal:       Right hand: Claw aspect. With moderate to severe generalized hand swelling palmarly and dorsally. Swelling is tenderness and non pitting .  Swelling and erythema extended upward up to mid forearm. Increased temperature and tender to touch. Patient unable to make a fist or extend fingers. Digits also tender to palpation diffusely in palmar and dorsal areas. Can passively extend digits with reported discomfort .Also limited  abduction or abduction. Decrease diffuse superficial sensation more dorsally than in palmar area.  Impress radial ulnar pulses present although difficult to assess due to degree of swelling. No distal cyanosis. There is a scar in volar wrist area from prior carpal tunnel syndrome surgery.  Lymphadenopathy:    He has no cervical adenopathy.  Neurological: He is alert and oriented to person, place, and time.  Skin:       Right hand and forearm swelling erythema and increased temperature as per musculoskeletal examination.    ED Course  Procedures (including critical care time)  Labs Reviewed - No data to display No results found.   1. Hand swelling   2. Hypertensive crisis       MDM  66 year old poorly controlled diabetic also with history of hypertension. Here complaining of right hand swelling for one day, denies chest pain or shortest of breath. denies known trauma. Has not taken his blood pressure medications today. On exam: Febrile with temperature 102.55F, Hypertensive with a blood pressure 215/105. Right hand and forearm swelling tense nonpitting up to mid forearm. Warm to touch. Claw hand. Limited digit flexion and extension. Decreased sensation more dorsally than in the palmar area. Cannot find pustules or skin breaks. No respiratory distress. Concerned for deep hand infection involving tendon and synovial spaces, possible compartment syndrome, also possible DVT. Patient had rocephin 1 g IM and acetaminophen 650 mg oral x1 prior transfer via shuttle to [facility] ED.         Randa Spike, MD 02/16/12 2132

## 2012-02-16 NOTE — ED Notes (Addendum)
Pt denies N/V, dizziness, fever and chills. Pt c/o feeling fatigued. Right hand notably swollen and warm to touch. Pt unable to bend wrist and make a fist. Pt c/o pain 10/10 when moving.

## 2012-02-16 NOTE — ED Provider Notes (Signed)
History     CSN: [id number]  Arrival date & time 02/16/12  [id number]   First MD Initiated Contact with Patient 02/16/12 2215      Chief Complaint  Patient presents with  . Arm Swelling    right hand    (Consider location/radiation/quality/duration/timing/severity/associated sxs/prior treatment) HPI Comments: 66 y/o male presents to the ED from [facility] with worsening right hand swelling beginning 2 days ago. States he woke up 2 days ago and noticed his hand swollen and warm. Cannot recall any injury, trauma, or insect bites. He is a diabetic and states his sugars are well controlled. Admits to associated fever and chills. Rates pain as 7/10 without movement, worsening to 9/10 with movement. Tried taking tylenol without any relief. Denies dumbness or tingling in his hand. Denies chest pain, sob, nausea, vomiting, lightheadedness, dizziness. He had carpal tunnel surgery on his right wrist 10 years ago.  The history is provided by the patient and the spouse.    Past Medical History  Diagnosis Date  . Renal insufficiency   . Diabetes mellitus   . Hypertension   . Gout   . Colon polyp 2003    Dr [name]; F/U was to be 2008( not completed)  . Diverticulosis 2003  . Pneumonia 09/29/11    Avelox X 10 daysas OP    Past Surgical History  Procedure Date  . Refractive surgery 2005  . Colonoscopy w/ polypectomy 2003    Family History  Problem Relation Age of Onset  . Hypertension Father   . Hypertension Mother   . Cancer Father     prostate  . Diabetes Mother   . Diabetes Father     History  Substance Use Topics  . Smoking status: Former Smoker    Quit date: 05/15/1968  . Smokeless tobacco: Not on file   Comment: Quit at age 42   . Alcohol Use: 1.2 oz/week    2 Cans of beer per week      Review of Systems  Constitutional: Positive for fever and chills.  HENT: Negative for neck pain and neck stiffness.   Respiratory: Negative for shortness of breath.   Cardiovascular:  Negative for chest pain.  Gastrointestinal: Negative for nausea and vomiting.  Musculoskeletal:       Right hand pain  Skin: Positive for color change. Negative for wound.  Neurological: Negative for syncope, light-headedness and headaches.  Hematological: Negative for adenopathy.  Psychiatric/Behavioral: Negative for confusion.    Allergies  Hydrochlorothiazide and Allopurinol  Home Medications   Current Outpatient Rx  Name Route Sig Dispense Refill  . AMLODIPINE BESYLATE 10 MG PO TABS Oral Take 10 mg by mouth daily.    [place] CARVEDILOL 25 MG PO TABS Oral Take 25 mg by mouth 2 (two) times daily with a meal.    . FUROSEMIDE 40 MG PO TABS Oral Take 40 mg by mouth daily as needed. For swelling    . HYDRALAZINE HCL 25 MG PO TABS Oral Take 75 mg by mouth daily.    . INSULIN LISPRO PROT & LISPRO (75-25) 100 UNIT/ML [place] SUSP Subcutaneous Inject 20 Units into the skin daily with breakfast.       BP 202/119  Pulse 66  Temp 100.8 F (38.2 C) (Oral)  Resp 16  SpO2 100%  Physical Exam  Constitutional: He is oriented to person, place, and time. He appears well-developed and well-nourished. No distress.  HENT:  Head: Normocephalic and atraumatic.  Mouth/Throat: Oropharynx is clear and moist.  Eyes: Conjunctivae normal are normal.  Neck: Normal range of motion. Neck supple.  Cardiovascular: Normal rate, regular rhythm, normal heart sounds and intact distal pulses.   Pulses:      Radial pulses are 2+ on the right side.  Pulmonary/Chest: Effort normal and breath sounds normal. No respiratory distress.  Abdominal: Soft. Bowel sounds are normal. There is no tenderness.  Musculoskeletal:       Right wrist: He exhibits decreased range of motion, tenderness, bony tenderness and swelling. He exhibits no laceration.       Right hand: He exhibits decreased range of motion, tenderness and bony tenderness. normal sensation noted.       Decreased active ROM with extension of all 5 digits of right hand.  No ttp of tendinous sheaths. Capillary refill < 3 seconds.  Neurological: He is alert and oriented to person, place, and time. No sensory deficit.  Skin: He is not diaphoretic. There is erythema (over right hand and wrist. warm to touch).     Psychiatric: He has a normal mood and affect. His behavior is normal.    ED Course  Procedures (including critical care time)  Labs Reviewed  CBC WITH DIFFERENTIAL - Abnormal; Notable for the following:    Monocytes Relative 13 (*)     Monocytes Absolute 1.1 (*)     All other components within normal limits  COMPREHENSIVE METABOLIC PANEL - Abnormal; Notable for the following:    Glucose, Bld 132 (*)     Creatinine, Ser 1.63 (*)     Calcium 10.7 (*)     GFR calc non Af Amer 43 (*)     GFR calc Af Amer 50 (*)     All other components within normal limits   Dg Wrist Complete Right  02/16/2012  *RADIOLOGY REPORT*  Clinical Data: Right wrist soft tissue swelling and increased warmth.  No reported injury.  RIGHT WRIST - COMPLETE 3+ VIEW  Comparison: Right hand radiographs obtained at the same time.  Findings: Diffuse soft tissue swelling.  Previously described degenerative changes of the wrist.  There is some collapse and bony remodeling and subluxation of the carpals with bony remodeling of the distal radius as well.  Atheromatous arterial calcifications.  IMPRESSION:  1.  Diffuse soft tissue swelling without soft tissue gas or bony changes of osteomyelitis. 2.  Right wrist degenerative changes with bony remodeling and collapse of the wrist as well as carpal subluxations.   Original Report Authenticated By: Gerald Stabs, M.D.    Dg Hand Complete Right  02/16/2012  *RADIOLOGY REPORT*  Clinical Data: Right hand wrist swelling.  Increased warmth.  No reported injury.  RIGHT HAND - COMPLETE 3+ VIEW  Comparison: None.  Findings: Diffuse soft tissue swelling, most pronounced dorsally. Carpal and radiocarpal joint degenerative changes as well as a distal  radial ulnar joint degenerative changes.  Atheromatous arterial calcifications.  IMPRESSION:  1.  Diffuse soft tissue swelling, most pronounced dorsally. 2.  No soft tissue gas or bony changes of osteomyelitis. 3.  Wrist degenerative changes.   Original Report Authenticated By: Gerald Stabs, M.D.      No diagnosis found.    MDM  66 y/o male with right hand and wrist cellulitis. xrays without evidence of osteomyelitis. Dr. Wyvonnia Dusky spoke with Dr. Caralyn Guile who will see patient in the morning. Patient will be moved to CDU overnight to be monitored and receive antibiotics until seen by Dr. Caralyn Guile in the morning.  Illene Labrador, PA-C 02/16/12 2318

## 2012-02-16 NOTE — ED Provider Notes (Signed)
Medical screening examination/treatment/procedure(s) were conducted as a shared visit with non-physician practitioner(s) and myself.  I personally evaluated the patient during the encounter  Atraumatic right hand and wrist pain and swelling for the past 2 days. History of diabetes. Febrile to 102 in urgent care. Unable to extend or flex and wrist. Diffuse swelling of the dorsal hand and wrist. Erythema and redness and swelling extending to mid forearm. Pain with passive extension of all fingers. Held in a flexed position and claw configuration. No focal tenderness along the flexor tendon sheath. +2 radial pulse, distal sensation intact. Unable to make fist and extend fingers. Thenar compartment appears to be soft Concern for deep space infection. Discussed with Dr. Caralyn Guile who recommends observation overnight for antibiotics, patient to remain n.p.o. he will see in the morning.  Ezequiel Essex, MD 02/16/12 2328

## 2012-02-16 NOTE — Progress Notes (Signed)
Orthopedic Tech Progress Note Patient Details:  Corey [name] 1945/09/26 [id number]  Ortho Devices Type of Ortho Device: Volar splint   [name] 02/16/2012, 11:38 PM

## 2012-02-16 NOTE — ED Notes (Signed)
Pt transported to radiology.

## 2012-02-16 NOTE — ED Notes (Addendum)
Pt states that his right hand started swelling 2 days ago and has been increasing in swelling over the past couple of days. Pt states that he went to [facility] and they couldnot do anything for his right hand. Pt does have swelling in all fingers, able to slightly wiggle and pulses present. Pt does have increased warmth in right hand compared to left. Pt denies injury.

## 2012-02-16 NOTE — ED Notes (Signed)
Pt   C/o   Pain   Swelling     r  Hand     Since  Yesterday      Pt  Has  Fever      And      His  Blood  Pressure  Is  High  Pt  Did     Not take  His  bp  meds  Today   denys  And   Chest  Pain     Or  Any  Shortness  Of  Breath at  This  Time    Pt  denys  Any  Injury

## 2012-02-17 LAB — SYNOVIAL CELL COUNT + DIFF, W/ CRYSTALS
Lymphocytes-Synovial Fld: 1 % (ref 0–20)
Monocyte-Macrophage-Synovial Fluid: 1 % — ABNORMAL LOW (ref 50–90)
WBC, Synovial: UNDETERMINED /mm3 (ref 0–200)

## 2012-02-17 LAB — GRAM STAIN

## 2012-02-17 LAB — GLUCOSE, CAPILLARY: Glucose-Capillary: 121 mg/dL — ABNORMAL HIGH (ref 70–99)

## 2012-02-17 MED ORDER — AMLODIPINE BESYLATE 10 MG PO TABS
10.0000 mg | ORAL_TABLET | Freq: Every day | ORAL | Status: DC
  Administered 2012-02-17: 10 mg via ORAL
  Filled 2012-02-17: qty 1

## 2012-02-17 MED ORDER — NAPROXEN SODIUM 220 MG PO TABS: 220.0000 mg | ORAL_TABLET | Freq: Two times a day (BID) | ORAL | Status: DC

## 2012-02-17 MED ORDER — CLONIDINE HCL 0.1 MG PO TABS
0.1000 mg | ORAL_TABLET | Freq: Once | ORAL | Status: AC
  Administered 2012-02-17: 0.1 mg via ORAL
  Filled 2012-02-17: qty 1

## 2012-02-17 NOTE — Progress Notes (Signed)
Pt's fluid analysis reviewed Gout crystals abundant Would consult medicine/hospitalist for medical treatment of gout No organisms seen on gram stain High white count intrasynovial common with gout

## 2012-02-17 NOTE — ED Notes (Signed)
Report from gram stain, crystal analysis, and cell count called to Dr Apolonio Schneiders.

## 2012-02-17 NOTE — ED Provider Notes (Signed)
Discussed case with Dr. Apolonio Schneiders who stated sx likely consistent with gouty arthritis and not with cellulitis or underlying infection.  Pt does have h/o renal insufficiency likely secondary to uncontrolled diabetes.  He has taken tylenol in the past for his h/o gout in his foot.  His CrCl >60.  BP has been elevated in the ED today.  Unable to give home med of hydralazine in po form therefore advised pt to take as soon as he gets home.  Pt and wife both voiced understanding.  Advised f/u with Dr. Linna Darner for repeat evaluation of bp on Monday as well.  Pt denies any chest pain, sob, or headaches.  No syncope.  [place], [place] 02/17/12 [phone]

## 2012-02-17 NOTE — ED Notes (Signed)
Pt being transported to CDU

## 2012-02-17 NOTE — ED Notes (Signed)
States hand started swelling two days ago.

## 2012-02-17 NOTE — Consult Note (Signed)
Reason for Consut: Right wrist swelling Referring Physician: Dr. Edison Pace Corey [name] is an 66 y.o. male.  [id number] reviewed in chart Pt seen examined at bedside in CDU  Past Medical History  Diagnosis Date  . Renal insufficiency   . Diabetes mellitus   . Hypertension   . Gout   . Colon polyp 2003    Dr [name]; F/U was to be 2008( not completed)  . Diverticulosis 2003  . Pneumonia 09/29/11    Avelox X 10 daysas OP    Past Surgical History  Procedure Date  . Refractive surgery 2005  . Colonoscopy w/ polypectomy 2003    Family History  Problem Relation Age of Onset  . Hypertension Father   . Hypertension Mother   . Cancer Father     prostate  . Diabetes Mother   . Diabetes Father     Social History:  reports that he quit smoking about 43 years ago. He does not have any smokeless tobacco history on file. He reports that he drinks about 1.2 ounces of alcohol per week. He reports that he does not use illicit drugs.  Allergies:  Allergies  Allergen Reactions  . Hydrochlorothiazide     Gout , uncontrolled diabetes and renal insufficiency  . Allopurinol     Renal insufficiency    Medications: I have reviewed the patient's current medications.  Results for orders placed during the hospital encounter of 02/16/12 (from the past 48 hour(s))  CBC WITH DIFFERENTIAL     Status: Abnormal   Collection Time   02/16/12  7:28 PM      Component Value Range Comment   WBC 8.6  4.0 - 10.5 K/uL    RBC 4.92  4.22 - 5.81 MIL/uL    Hemoglobin 13.3  13.0 - 17.0 g/dL    HCT 39.1  39.0 - 52.0 %    MCV 79.5  78.0 - 100.0 fL    MCH 27.0  26.0 - 34.0 pg    MCHC 34.0  30.0 - 36.0 g/dL    RDW 14.2  11.5 - 15.5 %    Platelets 167  150 - 400 K/uL    Neutrophils Relative 75  43 - 77 %    Neutro Abs 6.4  1.7 - 7.7 K/uL    Lymphocytes Relative 12  12 - 46 %    Lymphs Abs 1.1  0.7 - 4.0 K/uL    Monocytes Relative 13 (*) 3 - 12 %    Monocytes Absolute 1.1 (*) 0.1 - 1.0 K/uL    Eosinophils Relative 0  0 - 5 %    Eosinophils Absolute 0.0  0.0 - 0.7 K/uL    Basophils Relative 0  0 - 1 %    Basophils Absolute 0.0  0.0 - 0.1 K/uL   COMPREHENSIVE METABOLIC PANEL     Status: Abnormal   Collection Time   02/16/12  7:28 PM      Component Value Range Comment   Sodium 137  135 - 145 mEq/L    Potassium 3.6  3.5 - 5.1 mEq/L    Chloride 103  96 - 112 mEq/L    CO2 22  19 - 32 mEq/L    Glucose, Bld 132 (*) 70 - 99 mg/dL    BUN 23  6 - 23 mg/dL    Creatinine, Ser 1.63 (*) 0.50 - 1.35 mg/dL    Calcium 10.7 (*) 8.4 - 10.5 mg/dL    Total Protein 8.2  6.0 -  8.3 g/dL    Albumin 4.1  3.5 - 5.2 g/dL    AST 33  0 - 37 U/L    ALT 17  0 - 53 U/L    Alkaline Phosphatase 45  39 - 117 U/L    Total Bilirubin 1.0  0.3 - 1.2 mg/dL    GFR calc non Af Amer 43 (*) >90 mL/min    GFR calc Af Amer 50 (*) >90 mL/min   GLUCOSE, CAPILLARY     Status: Abnormal   Collection Time   02/17/12  6:09 AM      Component Value Range Comment   Glucose-Capillary 121 (*) 70 - 99 mg/dL   GLUCOSE, CAPILLARY     Status: Abnormal   Collection Time   02/17/12  7:45 AM      Component Value Range Comment   Glucose-Capillary 110 (*) 70 - 99 mg/dL    Comment 1 Notify RN       Dg Wrist Complete Right  02/16/2012  *RADIOLOGY REPORT*  Clinical Data: Right wrist soft tissue swelling and increased warmth.  No reported injury.  RIGHT WRIST - COMPLETE 3+ VIEW  Comparison: Right hand radiographs obtained at the same time.  Findings: Diffuse soft tissue swelling.  Previously described degenerative changes of the wrist.  There is some collapse and bony remodeling and subluxation of the carpals with bony remodeling of the distal radius as well.  Atheromatous arterial calcifications.  IMPRESSION:  1.  Diffuse soft tissue swelling without soft tissue gas or bony changes of osteomyelitis. 2.  Right wrist degenerative changes with bony remodeling and collapse of the wrist as well as carpal subluxations.   Original Report  Authenticated By: Gerald Stabs, M.D.    Dg Hand Complete Right  02/16/2012  *RADIOLOGY REPORT*  Clinical Data: Right hand wrist swelling.  Increased warmth.  No reported injury.  RIGHT HAND - COMPLETE 3+ VIEW  Comparison: None.  Findings: Diffuse soft tissue swelling, most pronounced dorsally. Carpal and radiocarpal joint degenerative changes as well as a distal radial ulnar joint degenerative changes.  Atheromatous arterial calcifications.  IMPRESSION:  1.  Diffuse soft tissue swelling, most pronounced dorsally. 2.  No soft tissue gas or bony changes of osteomyelitis. 3.  Wrist degenerative changes.   Original Report Authenticated By: Gerald Stabs, M.D.     See chart Blood pressure 170/98, pulse 48, temperature 98.1 F (36.7 C), temperature source Oral, resp. rate 18, SpO2 98.00%. General: awake alert nontoxic Right UE: moderate swelling to dorsum of hand and digits. 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

## 2012-02-18 NOTE — ED Provider Notes (Signed)
Medical screening examination/treatment/procedure(s) were performed by non-physician practitioner and as supervising physician I was immediately available for consultation/collaboration.   Julianne Rice, MD 02/18/12 [phone]

## 2012-02-19 ENCOUNTER — Ambulatory Visit (INDEPENDENT_AMBULATORY_CARE_PROVIDER_SITE_OTHER): Payer: 59 | Admitting: Internal Medicine

## 2012-02-19 ENCOUNTER — Encounter: Payer: Self-pay | Admitting: Internal Medicine

## 2012-02-19 VITALS — BP 132/90 | HR 53 | Temp 98.3°F | Resp 14 | Wt 218.6 lb

## 2012-02-19 DIAGNOSIS — I517 Cardiomegaly: Secondary | ICD-10-CM

## 2012-02-19 DIAGNOSIS — R0609 Other forms of dyspnea: Secondary | ICD-10-CM

## 2012-02-19 DIAGNOSIS — M109 Gout, unspecified: Secondary | ICD-10-CM

## 2012-02-19 DIAGNOSIS — E1029 Type 1 diabetes mellitus with other diabetic kidney complication: Secondary | ICD-10-CM

## 2012-02-19 DIAGNOSIS — I10 Essential (primary) hypertension: Secondary | ICD-10-CM

## 2012-02-19 DIAGNOSIS — N259 Disorder resulting from impaired renal tubular function, unspecified: Secondary | ICD-10-CM

## 2012-02-19 DIAGNOSIS — E1065 Type 1 diabetes mellitus with hyperglycemia: Secondary | ICD-10-CM

## 2012-02-19 LAB — PATHOLOGIST SMEAR REVIEW

## 2012-02-19 MED ORDER — COLCHICINE 0.6 MG PO TABS: ORAL_TABLET | ORAL | Status: DC

## 2012-02-19 NOTE — Progress Notes (Signed)
  Subjective:    Patient ID: Corey [name], male    DOB: 09-24-1945, 66 y.o.   MRN: [id number]  HPI The emergency room record 02/16/12 was reviewed. His temperature was 102.4 in the emergency room. Imaging did not reveal osteomyelitis and aspiration of the right wrist revealed monosodium urate crystals. Culture has been negative at 48 hours. White blood count was normal; there was no left shift Fever has resolved; but swelling persists. He's been taking Aleve 2 every 8 hours with food.      Review of Systems Blood pressure was dramatically elevated during the emergency room visit. It has improved once he has restarted his hydralazine. He's been taking hydralazine 3 pills at a time; the concept of drug  half-life was explained to him.  He describes dyspnea on exertion climbing stairs. His last chest x-ray showed cardiac enlargement and pulmonary vascular congestion     Objective:   Physical Exam  He appears adequately nourished in no acute distress.  The most striking physical finding is dramatic edema of the distal third of the forearm and right hand. This is slightly tender to palpation  He has no lymphadenopathy about the neck, axilla, or epitrochlear areas.  Grade 1 systolic murmur is present; rhythm is regular.  Chest is clear with no increased work of breathing or abnormal breath sounds  He has no cyanosis or clubbing. There is no pedal edema.        Assessment & Plan:  #1 acute gout right upper extremity  #2 hypertension,   #3 renal insufficiency  #4 DM  #5 dyspnea on exertion the context of cardiomegaly and pulmonary vascular congestion  Plan: See orders & recommendations

## 2012-02-19 NOTE — Assessment & Plan Note (Signed)
Oral steroids or not an optimal option because of his diabetes

## 2012-02-19 NOTE — Assessment & Plan Note (Signed)
GFR was calculated at 50; colchicine dose will be adjusted appropriately

## 2012-02-19 NOTE — Assessment & Plan Note (Signed)
Colchicine will be prescribed one pill every Monday, Wednesday, Friday, and Sunday. He can continue the Aleve 1-2 every 8 hours with food as needed for acute pain. He should keep the right forearm and hand elevated as much as possible.

## 2012-02-19 NOTE — Assessment & Plan Note (Signed)
He was instructed to take the hydralazine 3 times a day rather than 3 at one time

## 2012-02-19 NOTE — Patient Instructions (Addendum)
The most common cause of elevated uric acid which causes gout is the ingestion of sugar from high fructose corn syrup sources added to processed foods & drinks.  Eat a low-fat diet with lots of fruits and vegetables, up to 7-9 servings per day. Consume less than 40 (preferably ZERO) grams of sugar per day from foods & drinks with High Fructose Corn Syrup (HFCS) sugar as #1,2,3 or # 4 on label.[employer], [employer] [place] do not carry products with HFCS. Follow a  low carb nutrition program such as [place] or [employer]  to prevent Diabetes progression . White carbohydrates (potatoes, rice, bread, and pasta) have a high spike of sugar and a high load of sugar. For example a  baked potato has a cup of sugar and a  french fry  2 teaspoons of sugar. Yams, wild  rice, whole grained bread &  wheat pasta have been much lower spike and load of  sugar. Portions should be the size of a deck of cards or your palm.  Blood Pressure Goal  Ideally is an AVERAGE < 135/85. This AVERAGE should be calculated from @ least 5-7 BP readings taken @ different times of day on different days of week. You should not respond to isolated BP readings , but rather the AVERAGE for that week  Order for x-rays entered into  the computer; these will be performed at [facility]. across from [facility]. No appointment is necessary.  If you activate My Chart; the results can be released to you as soon as they populate from the lab. If you choose not to use this program; the labs have to be reviewed, copied & mailed   causing a delay in getting the results to you.

## 2012-02-20 ENCOUNTER — Ambulatory Visit (INDEPENDENT_AMBULATORY_CARE_PROVIDER_SITE_OTHER)
Admission: RE | Admit: 2012-02-20 | Discharge: 2012-02-20 | Disposition: A | Discharge status: Home / Self Care | Payer: 59 | Source: Ambulatory Visit | Attending: Internal Medicine | Admitting: Internal Medicine

## 2012-02-20 DIAGNOSIS — I517 Cardiomegaly: Secondary | ICD-10-CM

## 2012-02-20 DIAGNOSIS — R0989 Other specified symptoms and signs involving the circulatory and respiratory systems: Secondary | ICD-10-CM

## 2012-02-20 LAB — CULTURE, ROUTINE-ABSCESS: Culture: NO GROWTH

## 2012-02-20 LAB — BRAIN NATRIURETIC PEPTIDE: Pro B Natriuretic peptide (BNP): 416 pg/mL — ABNORMAL HIGH (ref 0.0–100.0)

## 2012-02-21 ENCOUNTER — Telehealth: Payer: Self-pay

## 2012-02-21 DIAGNOSIS — I10 Essential (primary) hypertension: Secondary | ICD-10-CM

## 2012-02-21 DIAGNOSIS — I517 Cardiomegaly: Secondary | ICD-10-CM

## 2012-02-21 NOTE — Telephone Encounter (Signed)
Message copied by Logan Bores on Wed Feb 21, 2012  8:12 AM ------      Message from: Hendricks Limes      Created: Tue Feb 20, 2012  6:11 PM       Cardiomegaly heart enlargement is present without heart failure or other active heart or lung process. Most important is to control blood pressure.Your MINIMAL BP goal = AVERAGE < 140/90; ideally <135/85. Avoid ingestion of  excess salt/sodium.Cook with pepper & other spices . Use  the Mrs Deliah Boston products to season food @ the table rather than salt. Avoid foods which taste salty or "vinegary" as their  sodium contentet will be high.       I recommend a Cardiology consultation to determine optimal therapy; please inform me if you have a physician preference. [employer]

## 2012-02-21 NOTE — Telephone Encounter (Signed)
Spoke with patient, I discuss Dr.Hopper's comments to Chest Xray. Patient verbalized understanding and is ok with [place] setting up referral to Cardiology (no preference).

## 2012-02-23 ENCOUNTER — Ambulatory Visit (INDEPENDENT_AMBULATORY_CARE_PROVIDER_SITE_OTHER): Payer: 59 | Admitting: Cardiovascular Disease

## 2012-02-23 ENCOUNTER — Encounter: Payer: Self-pay | Admitting: Cardiovascular Disease

## 2012-02-23 VITALS — BP 160/88 | HR 52 | Ht 66.0 in | Wt 219.0 lb

## 2012-02-23 DIAGNOSIS — R9431 Abnormal electrocardiogram [ECG] [EKG]: Secondary | ICD-10-CM | POA: Insufficient documentation

## 2012-02-23 DIAGNOSIS — I10 Essential (primary) hypertension: Secondary | ICD-10-CM

## 2012-02-23 DIAGNOSIS — R0989 Other specified symptoms and signs involving the circulatory and respiratory systems: Secondary | ICD-10-CM

## 2012-02-23 DIAGNOSIS — R0600 Dyspnea, unspecified: Secondary | ICD-10-CM

## 2012-02-23 DIAGNOSIS — N259 Disorder resulting from impaired renal tubular function, unspecified: Secondary | ICD-10-CM

## 2012-02-23 DIAGNOSIS — I119 Hypertensive heart disease without heart failure: Secondary | ICD-10-CM

## 2012-02-23 DIAGNOSIS — I517 Cardiomegaly: Secondary | ICD-10-CM | POA: Insufficient documentation

## 2012-02-23 LAB — CULTURE, BLOOD (ROUTINE X 2): Culture: NO GROWTH

## 2012-02-23 NOTE — Assessment & Plan Note (Signed)
Improving with compliance long term risk of renal failure stroke and DCM discussed  Next step would be to increase hydralazine

## 2012-02-23 NOTE — Assessment & Plan Note (Signed)
Compared to 2004 lateral T waves actually look better.  Likely from HTN  If echo shows normal EF no need for cath or stress testing as he has no chest pain

## 2012-02-23 NOTE — Assessment & Plan Note (Signed)
Needs echo to assess EF and LVH  Suspect his heart is thick not enlarged.

## 2012-02-23 NOTE — Assessment & Plan Note (Signed)
Likely medical renal disease.  Renal duplex to assess RAS and kidney sizes

## 2012-02-23 NOTE — Patient Instructions (Signed)
Your physician recommends that you schedule a follow-up appointment in:  AS NEEDED  Your physician recommends that you continue on your current medications as directed. Please refer to the Current Medication list given to you today.  Your physician has requested that you have an echocardiogram. Echocardiography is a painless test that uses sound waves to create images of your heart. It provides your doctor with information about the size and shape of your heart and how well your heart's chambers and valves are working. This procedure takes approximately one hour. There are no restrictions for this procedure.  DX HYPERTENSION Your physician has requested that you have a renal artery duplex. During this test, an ultrasound is used to evaluate blood flow to the kidneys. Allow one hour for this exam. Do not eat after midnight the day before and avoid carbonated beverages. Take your medications as you usually do.  DX HYPERTENSION

## 2012-02-23 NOTE — Progress Notes (Signed)
Patient ID: Corey [name], male   DOB: 12-Dec-1945, 66 y.o.   MRN: [id number] 66 yo with longstanding poorly controlled HTN.  Seen in ER recently for right wrist pain that turned out to be gout.  In ER CAR with cardiomegaly and BNP mildly elevated in 400 range.  No clinical CHF.  Has been more compliant with BP meds last 2 weeks.  Has seen primary [facility] for 12 years.  Chronically abnormal ECG with LVH and LAD QRS 124 IVCD all likely from HTN.  No chest pain. No edema NO PND or orthopnea. Has not had evaluation of EF.  Denies ETOH and quit smoking a long time ago.  ROS: Denies fever, malais, weight loss, blurry vision, decreased visual acuity, cough, sputum, SOB, hemoptysis, pleuritic pain, palpitaitons, heartburn, abdominal pain, melena, lower extremity edema, claudication, or rash.  All other systems reviewed and negative   General: Affect appropriate Healthy:  appears stated age 65: normal Neck supple with no adenopathy JVP normal no bruits no thyromegaly Lungs clear with no wheezing and good diaphragmatic motion Heart:  S1/S2 no murmur,rub, gallop or click PMI normal Abdomen: benighn, BS positve, no tenderness, no AAA no bruit.  No HSM or HJR Distal pulses intact with no bruits No edema Neuro non-focal Skin warm and dry No muscular weakness  Medications Current Outpatient Prescriptions  Medication Sig Dispense Refill  . amLODipine (NORVASC) 10 MG tablet Take 10 mg by mouth daily.      . carvedilol (COREG) 25 MG tablet Take 25 mg by mouth 2 (two) times daily with a meal.      . colchicine 0.6 MG tablet Because of kidney impairment; take one pill every Monday, Wednesday, Friday, and Sunday  30 tablet  0  . furosemide (LASIX) 40 MG tablet Take 40 mg by mouth daily as needed. For swelling      . hydrALAZINE (APRESOLINE) 25 MG tablet Take 25 mg by mouth 3 (three) times daily.       . insulin lispro protamine-insulin lispro (HUMALOG MIX 75/25 KWIKPEN) (75-25) 100 UNIT/ML SUSP Inject 20  Units into the skin daily with breakfast.       . naproxen sodium (ALEVE) 220 MG tablet Take 1 tablet (220 mg total) by mouth 2 (two) times daily with a meal.  20 tablet  0    Allergies Hydrochlorothiazide and Allopurinol  Family History: Family History  Problem Relation Age of Onset  . Hypertension Father   . Hypertension Mother   . Cancer Father     prostate  . Diabetes Mother   . Diabetes Father     Social History: History   Social History  . Marital Status: Married    Spouse Name: N/A    Number of Children: N/A  . Years of Education: N/A   Occupational History  . Not on file.   Social History Main Topics  . Smoking status: Former Smoker    Quit date: 05/15/1968  . Smokeless tobacco: Not on file   Comment: Quit at age 32   . Alcohol Use: 1.2 oz/week    2 Cans of beer per week  . Drug Use: No  . Sexually Active: Not on file   Other Topics Concern  . Not on file   Social History Narrative   No salt restriction      Electrocardiogram:  NSR rate 52 LAD LVH QRS 124 IVCD  Assessment and Plan

## 2012-02-27 ENCOUNTER — Ambulatory Visit: Payer: Self-pay | Admitting: Endocrinology

## 2012-02-28 ENCOUNTER — Other Ambulatory Visit: Payer: Self-pay | Admitting: Internal Medicine

## 2012-02-29 ENCOUNTER — Ambulatory Visit (HOSPITAL_COMMUNITY): Payer: 59 | Attending: Cardiovascular Disease

## 2012-02-29 DIAGNOSIS — I369 Nonrheumatic tricuspid valve disorder, unspecified: Secondary | ICD-10-CM | POA: Insufficient documentation

## 2012-02-29 DIAGNOSIS — I517 Cardiomegaly: Secondary | ICD-10-CM | POA: Insufficient documentation

## 2012-02-29 DIAGNOSIS — R0600 Dyspnea, unspecified: Secondary | ICD-10-CM

## 2012-02-29 DIAGNOSIS — I379 Nonrheumatic pulmonary valve disorder, unspecified: Secondary | ICD-10-CM | POA: Insufficient documentation

## 2012-02-29 DIAGNOSIS — I319 Disease of pericardium, unspecified: Secondary | ICD-10-CM | POA: Insufficient documentation

## 2012-02-29 DIAGNOSIS — I10 Essential (primary) hypertension: Secondary | ICD-10-CM | POA: Insufficient documentation

## 2012-02-29 DIAGNOSIS — I119 Hypertensive heart disease without heart failure: Secondary | ICD-10-CM

## 2012-02-29 DIAGNOSIS — I080 Rheumatic disorders of both mitral and aortic valves: Secondary | ICD-10-CM | POA: Insufficient documentation

## 2012-02-29 NOTE — Progress Notes (Signed)
Echocardiogram performed.  

## 2012-03-05 ENCOUNTER — Telehealth: Payer: Self-pay | Admitting: *Deleted

## 2012-03-05 DIAGNOSIS — E854 Organ-limited amyloidosis: Secondary | ICD-10-CM

## 2012-03-05 NOTE — Telephone Encounter (Signed)
PT AWARE OF ECHO RESULTS AND THE NEED TO  HAVE CARDIAC MRI  DONE WITH GADOLINIUM   SCHEDULE ON DAY DR NISHAN  CAN READ  DX  R/O AMYLOID

## 2012-03-06 NOTE — Telephone Encounter (Signed)
No precert required per [employer] online. Ready to schedule

## 2012-03-14 ENCOUNTER — Encounter (INDEPENDENT_AMBULATORY_CARE_PROVIDER_SITE_OTHER): Payer: 59

## 2012-03-14 DIAGNOSIS — I10 Essential (primary) hypertension: Secondary | ICD-10-CM

## 2012-03-28 ENCOUNTER — Ambulatory Visit (HOSPITAL_COMMUNITY)
Admission: RE | Admit: 2012-03-28 | Discharge: 2012-03-28 | Disposition: A | Discharge status: Home / Self Care | Payer: 59 | Source: Ambulatory Visit | Attending: Cardiovascular Disease | Admitting: Cardiovascular Disease

## 2012-03-28 DIAGNOSIS — I319 Disease of pericardium, unspecified: Secondary | ICD-10-CM

## 2012-03-28 DIAGNOSIS — I517 Cardiomegaly: Secondary | ICD-10-CM | POA: Insufficient documentation

## 2012-03-28 DIAGNOSIS — E8589 Other amyloidosis: Secondary | ICD-10-CM | POA: Insufficient documentation

## 2012-03-28 DIAGNOSIS — E639 Nutritional deficiency, unspecified: Secondary | ICD-10-CM | POA: Insufficient documentation

## 2012-03-28 DIAGNOSIS — E854 Organ-limited amyloidosis: Secondary | ICD-10-CM

## 2012-03-28 MED ORDER — GADOBENATE DIMEGLUMINE 529 MG/ML IV SOLN
30.0000 mL | Freq: Once | INTRAVENOUS | Status: AC
  Administered 2012-03-28: 30 mL via INTRAVENOUS

## 2012-03-29 ENCOUNTER — Ambulatory Visit (INDEPENDENT_AMBULATORY_CARE_PROVIDER_SITE_OTHER): Payer: 59 | Admitting: Endocrinology

## 2012-03-29 ENCOUNTER — Encounter: Payer: Self-pay | Admitting: Endocrinology

## 2012-03-29 VITALS — BP 144/80 | HR 63 | Temp 98.6°F | Wt 218.0 lb

## 2012-03-29 DIAGNOSIS — E119 Type 2 diabetes mellitus without complications: Secondary | ICD-10-CM | POA: Insufficient documentation

## 2012-03-29 LAB — HEMOGLOBIN A1C: Hgb A1c MFr Bld: 7 % — ABNORMAL HIGH (ref <5.7)

## 2012-03-29 MED ORDER — INSULIN PEN NEEDLE 31G X 8 MM MISC: 1.0000 | Freq: Every day | Status: DC

## 2012-03-29 NOTE — Progress Notes (Signed)
Subjective:    Patient ID: Corey [name], male    DOB: Mar 06, 1946, 66 y.o.   MRN: [id number]  HPI Pt returns for f/u of insulin-requiring DM (dx'ed 0000000; complicated by renal insufficiency).  He needs a simple (qd) insulin regimen.  no cbg record, but states cbg's are 88-118 in am.  He does not check later in the day. Past Medical History  Diagnosis Date  . Renal insufficiency   . Diabetes mellitus   . Hypertension   . Gout   . Colon polyp 2003    Dr [name]; F/U was to be 2008( not completed)  . Diverticulosis 2003  . Pneumonia 09/29/11    Avelox X 10 daysas OP    Past Surgical History  Procedure Date  . Refractive surgery 2005  . Colonoscopy w/ polypectomy 2003  . Wrist aspiration 02/16/12     monosodium urate crystals; Dr Caralyn Guile    History   Social History  . Marital Status: Married    Spouse Name: N/A    Number of Children: N/A  . Years of Education: N/A   Occupational History  . Not on file.   Social History Main Topics  . Smoking status: Former Smoker    Quit date: 05/15/1968  . Smokeless tobacco: Not on file     Comment: Quit at age 21   . Alcohol Use: 1.2 oz/week    2 Cans of beer per week  . Drug Use: No  . Sexually Active: Not on file   Other Topics Concern  . Not on file   Social History Narrative   No salt restriction      Current Outpatient Prescriptions on File Prior to Visit  Medication Sig Dispense Refill  . amLODipine (NORVASC) 10 MG tablet Take 10 mg by mouth daily.      Marland Kitchen amLODipine (NORVASC) 10 MG tablet TAKE ONE TABLET BY MOUTH EVERY DAY  30 tablet  1  . carvedilol (COREG) 25 MG tablet Take 25 mg by mouth 2 (two) times daily with a meal.      . colchicine 0.6 MG tablet Because of kidney impairment; take one pill every Monday, Wednesday, Friday, and Sunday  30 tablet  0  . furosemide (LASIX) 40 MG tablet Take 40 mg by mouth daily as needed. For swelling      . hydrALAZINE (APRESOLINE) 25 MG tablet Take 25 mg by mouth 3 (three) times  daily.       . insulin lispro protamine-insulin lispro (HUMALOG MIX 75/25 KWIKPEN) (75-25) 100 UNIT/ML SUSP Inject 20 Units into the skin daily with breakfast.       . naproxen sodium (ALEVE) 220 MG tablet Take 1 tablet (220 mg total) by mouth 2 (two) times daily with a meal.  20 tablet  0    Allergies  Allergen Reactions  . Hydrochlorothiazide     Gout , uncontrolled diabetes and renal insufficiency  . Allopurinol     Renal insufficiency    Family History  Problem Relation Age of Onset  . Hypertension Father   . Hypertension Mother   . Cancer Father     prostate  . Diabetes Mother   . Diabetes Father    BP 144/80  Pulse 63  Temp 98.6 F (37 C) (Oral)  Wt 218 lb (98.884 kg)  SpO2 95%  Review of Systems denies hypoglycemia.      Objective:   Physical Exam VITAL SIGNS:  See vs page GENERAL: no distress Pulses: dorsalis pedis  intact bilat.   Feet: no deformity.  no ulcer on the feet.  feet are of normal color and temp.  no edema.  There is bilateral onychomycosis, and overlapping toes. Neuro: sensation is intact to touch on the feet  Lab Results  Component Value Date   HGBA1C 7.0* 03/29/2012      Assessment & Plan:  DM.  this is the best control this pt should aim for, given this regimen, which does match insulin to his changing needs throughout the day

## 2012-03-29 NOTE — Patient Instructions (Addendum)
check your blood sugar 2 times a day.  vary the time of day when you check, between before the 3 meals, and at bedtime.  also check if you have symptoms of your blood sugar being too high or too low.  please keep a record of the readings and bring it to your next appointment here.  please call us sooner if you are having low blood sugar episodes. Please make a follow-up appointment in 3 months.   blood tests are being requested for you today.  We'll contact you with results.

## 2012-03-30 LAB — MICROALBUMIN / CREATININE URINE RATIO: Microalb Creat Ratio: 14.3 mg/g (ref 0.0–30.0)

## 2012-04-02 ENCOUNTER — Telehealth: Payer: Self-pay | Admitting: *Deleted

## 2012-04-02 NOTE — Telephone Encounter (Signed)
Message copied by Ellene Route on Tue Apr 02, 2012  9:19 AM ------      Message from: Renato Shin      Created: Sat Mar 30, 2012  8:01 AM       please call patient:      blood sugar is good      Please continue the same insulin      i'll see you next time.

## 2012-04-02 NOTE — Telephone Encounter (Signed)
patient returned call concerning lab results. Notified of good lab results and to continue insulin as ordered and to follow up with Dr. Loanne Drilling at next appt.

## 2012-04-02 NOTE — Telephone Encounter (Signed)
UNABLE TO REACH PATIENT AT PHONE # LISTED IN CHART, EPIC, # DOES NOT ACCEPT UNIDENTIFIED CALLS. WAS UNABLE TO LEAVE A MESSAGE TO PATIENT CONCERNING LAB RESULTS AND MESSAGE FROM DR. ELLISON, 2ND ATTEMPT TO DO THIS.

## 2012-05-15 DIAGNOSIS — I219 Acute myocardial infarction, unspecified: Secondary | ICD-10-CM

## 2012-05-15 DIAGNOSIS — I639 Cerebral infarction, unspecified: Secondary | ICD-10-CM

## 2012-05-15 HISTORY — PX: PEG TUBE REMOVAL: SHX2187

## 2012-05-15 HISTORY — PX: PEG PLACEMENT: SHX5437

## 2012-05-15 HISTORY — DX: Acute myocardial infarction, unspecified: I21.9

## 2012-05-15 HISTORY — PX: TRACHEOSTOMY: SUR1362

## 2012-05-15 HISTORY — DX: Cerebral infarction, unspecified: I63.9

## 2012-07-02 ENCOUNTER — Ambulatory Visit: Payer: 59 | Admitting: Endocrinology

## 2012-07-26 ENCOUNTER — Ambulatory Visit (INDEPENDENT_AMBULATORY_CARE_PROVIDER_SITE_OTHER): Payer: No Typology Code available for payment source | Admitting: Endocrinology

## 2012-07-26 ENCOUNTER — Encounter: Payer: Self-pay | Admitting: Endocrinology

## 2012-07-26 VITALS — BP 138/86 | HR 77 | Wt 222.0 lb

## 2012-07-26 DIAGNOSIS — E1029 Type 1 diabetes mellitus with other diabetic kidney complication: Secondary | ICD-10-CM

## 2012-07-26 NOTE — Patient Instructions (Addendum)
check your blood sugar 2 times a day.  vary the time of day when you check, between before the 3 meals, and at bedtime.  also check if you have symptoms of your blood sugar being too high or too low.  please keep a record of the readings and bring it to your next appointment here.  please call us sooner if you are having low blood sugar episodes. Please make a follow-up appointment in 3 months.   blood tests are being requested for you today.  We'll contact you with results.

## 2012-07-26 NOTE — Progress Notes (Signed)
Subjective:    Patient ID: Corey [name], male    DOB: 09-20-1945, 67 y.o.   MRN: [id number]  HPI Pt returns for f/u of insulin-requiring DM (dx'ed 1997; he has been on insulin in 2012, due to intolerance/failure/contraindication of oral agents; it is complicated by renal insufficiency; he needs a simple (qd) insulin regimen).  no cbg record, but states cbg's vary from 100-120.  It is in general higher as the day goes on.  pt states he feels well in general.   Past Medical History  Diagnosis Date  . Renal insufficiency   . Diabetes mellitus   . Hypertension   . Gout   . Colon polyp 2003    Dr [name]; F/U was to be 2008( not completed)  . Diverticulosis 2003  . Pneumonia 09/29/11    Avelox X 10 daysas OP    Past Surgical History  Procedure Laterality Date  . Refractive surgery  2005  . Colonoscopy w/ polypectomy  2003  . Wrist aspiration  02/16/12     monosodium urate crystals; Dr Caralyn Guile    History   Social History  . Marital Status: Married    Spouse Name: N/A    Number of Children: N/A  . Years of Education: N/A   Occupational History  . Not on file.   Social History Main Topics  . Smoking status: Former Smoker    Quit date: 05/15/1968  . Smokeless tobacco: Not on file     Comment: Quit at age 89   . Alcohol Use: 1.2 oz/week    2 Cans of beer per week  . Drug Use: No  . Sexually Active: Not on file   Other Topics Concern  . Not on file   Social History Narrative   No salt restriction         Current Outpatient Prescriptions on File Prior to Visit  Medication Sig Dispense Refill  . amLODipine (NORVASC) 10 MG tablet Take 10 mg by mouth daily.      Marland Kitchen amLODipine (NORVASC) 10 MG tablet TAKE ONE TABLET BY MOUTH EVERY DAY  30 tablet  1  . carvedilol (COREG) 25 MG tablet Take 25 mg by mouth 2 (two) times daily with a meal.      . colchicine 0.6 MG tablet Because of kidney impairment; take one pill every Monday, Wednesday, Friday, and Sunday  30 tablet  0  .  furosemide (LASIX) 40 MG tablet Take 40 mg by mouth daily as needed. For swelling      . hydrALAZINE (APRESOLINE) 25 MG tablet Take 25 mg by mouth 3 (three) times daily.       . insulin lispro protamine-insulin lispro (HUMALOG MIX 75/25 KWIKPEN) (75-25) 100 UNIT/ML SUSP Inject 20 Units into the skin daily with breakfast.       . Insulin Pen Needle (B-D ULTRAFINE III SHORT PEN) 31G X 8 MM MISC 1 Device by Does not apply route daily.  30 each  11  . naproxen sodium (ALEVE) 220 MG tablet Take 1 tablet (220 mg total) by mouth 2 (two) times daily with a meal.  20 tablet  0   No current facility-administered medications on file prior to visit.    Allergies  Allergen Reactions  . Hydrochlorothiazide     Gout , uncontrolled diabetes and renal insufficiency  . Allopurinol     Renal insufficiency    Family History  Problem Relation Age of Onset  . Hypertension Father   . Hypertension Mother   .  Cancer Father     prostate  . Diabetes Mother   . Diabetes Father     BP 138/86  Pulse 77  Wt 222 lb (100.699 kg)  BMI 35.85 kg/m2  SpO2 98%  Review of Systems denies hypoglycemia    Objective:   Physical Exam VITAL SIGNS:  See vs page GENERAL: no distress SKIN:  Insulin injection sites at the anterior abdomen are normal.  Lab Results  Component Value Date   HGBA1C 8.4* 07/26/2012      Assessment & Plan:  DM, needs increased rx.  therapy limited by pt's need for a simple regimen

## 2012-08-02 ENCOUNTER — Telehealth: Payer: Self-pay | Admitting: Endocrinology

## 2012-08-02 NOTE — Telephone Encounter (Signed)
Pt advised and states an understanding 

## 2012-08-02 NOTE — Telephone Encounter (Signed)
Please call pt regarding letter he received to adjust his insulin. He has questions. CB# [phone] / Venida Jarvis

## 2012-09-19 ENCOUNTER — Emergency Department (HOSPITAL_COMMUNITY): Payer: No Typology Code available for payment source

## 2012-09-19 ENCOUNTER — Inpatient Hospital Stay (HOSPITAL_COMMUNITY)
Admission: EM | Admit: 2012-09-19 | Discharge: 2012-10-02 | DRG: 004 | Disposition: A | Discharge status: Other Acute Inpatient Hospital | Payer: No Typology Code available for payment source | Attending: Pulmonary Disease | Admitting: Pulmonary Disease

## 2012-09-19 ENCOUNTER — Encounter (HOSPITAL_COMMUNITY): Payer: Self-pay | Admitting: Radiology

## 2012-09-19 DIAGNOSIS — R9431 Abnormal electrocardiogram [ECG] [EKG]: Secondary | ICD-10-CM

## 2012-09-19 DIAGNOSIS — E876 Hypokalemia: Secondary | ICD-10-CM | POA: Diagnosis not present

## 2012-09-19 DIAGNOSIS — Z860100 Personal history of colon polyps, unspecified: Secondary | ICD-10-CM

## 2012-09-19 DIAGNOSIS — D649 Anemia, unspecified: Secondary | ICD-10-CM

## 2012-09-19 DIAGNOSIS — J9601 Acute respiratory failure with hypoxia: Secondary | ICD-10-CM

## 2012-09-19 DIAGNOSIS — E1065 Type 1 diabetes mellitus with hyperglycemia: Secondary | ICD-10-CM

## 2012-09-19 DIAGNOSIS — R4182 Altered mental status, unspecified: Secondary | ICD-10-CM | POA: Diagnosis present

## 2012-09-19 DIAGNOSIS — Z91199 Patient's noncompliance with other medical treatment and regimen due to unspecified reason: Secondary | ICD-10-CM

## 2012-09-19 DIAGNOSIS — R809 Proteinuria, unspecified: Secondary | ICD-10-CM | POA: Diagnosis present

## 2012-09-19 DIAGNOSIS — N259 Disorder resulting from impaired renal tubular function, unspecified: Secondary | ICD-10-CM

## 2012-09-19 DIAGNOSIS — Z9119 Patient's noncompliance with other medical treatment and regimen: Secondary | ICD-10-CM

## 2012-09-19 DIAGNOSIS — I319 Disease of pericardium, unspecified: Secondary | ICD-10-CM

## 2012-09-19 DIAGNOSIS — Z87891 Personal history of nicotine dependence: Secondary | ICD-10-CM

## 2012-09-19 DIAGNOSIS — J690 Pneumonitis due to inhalation of food and vomit: Secondary | ICD-10-CM | POA: Diagnosis present

## 2012-09-19 DIAGNOSIS — I169 Hypertensive crisis, unspecified: Secondary | ICD-10-CM

## 2012-09-19 DIAGNOSIS — I421 Obstructive hypertrophic cardiomyopathy: Secondary | ICD-10-CM | POA: Diagnosis present

## 2012-09-19 DIAGNOSIS — J9600 Acute respiratory failure, unspecified whether with hypoxia or hypercapnia: Secondary | ICD-10-CM | POA: Diagnosis present

## 2012-09-19 DIAGNOSIS — J9811 Atelectasis: Secondary | ICD-10-CM

## 2012-09-19 DIAGNOSIS — I509 Heart failure, unspecified: Secondary | ICD-10-CM | POA: Diagnosis present

## 2012-09-19 DIAGNOSIS — J9690 Respiratory failure, unspecified, unspecified whether with hypoxia or hypercapnia: Secondary | ICD-10-CM

## 2012-09-19 DIAGNOSIS — I469 Cardiac arrest, cause unspecified: Secondary | ICD-10-CM

## 2012-09-19 DIAGNOSIS — I10 Essential (primary) hypertension: Secondary | ICD-10-CM

## 2012-09-19 DIAGNOSIS — G9340 Encephalopathy, unspecified: Secondary | ICD-10-CM

## 2012-09-19 DIAGNOSIS — R569 Unspecified convulsions: Secondary | ICD-10-CM | POA: Diagnosis present

## 2012-09-19 DIAGNOSIS — Z79899 Other long term (current) drug therapy: Secondary | ICD-10-CM

## 2012-09-19 DIAGNOSIS — E870 Hyperosmolality and hypernatremia: Secondary | ICD-10-CM | POA: Diagnosis not present

## 2012-09-19 DIAGNOSIS — E119 Type 2 diabetes mellitus without complications: Secondary | ICD-10-CM

## 2012-09-19 DIAGNOSIS — I5033 Acute on chronic diastolic (congestive) heart failure: Principal | ICD-10-CM | POA: Diagnosis present

## 2012-09-19 DIAGNOSIS — E46 Unspecified protein-calorie malnutrition: Secondary | ICD-10-CM | POA: Diagnosis not present

## 2012-09-19 DIAGNOSIS — J810 Acute pulmonary edema: Secondary | ICD-10-CM

## 2012-09-19 DIAGNOSIS — I517 Cardiomegaly: Secondary | ICD-10-CM

## 2012-09-19 DIAGNOSIS — I498 Other specified cardiac arrhythmias: Secondary | ICD-10-CM | POA: Diagnosis not present

## 2012-09-19 DIAGNOSIS — E1165 Type 2 diabetes mellitus with hyperglycemia: Secondary | ICD-10-CM | POA: Diagnosis present

## 2012-09-19 DIAGNOSIS — T17308A Unspecified foreign body in larynx causing other injury, initial encounter: Secondary | ICD-10-CM | POA: Diagnosis not present

## 2012-09-19 DIAGNOSIS — N189 Chronic kidney disease, unspecified: Secondary | ICD-10-CM | POA: Diagnosis present

## 2012-09-19 DIAGNOSIS — M109 Gout, unspecified: Secondary | ICD-10-CM

## 2012-09-19 DIAGNOSIS — E1029 Type 1 diabetes mellitus with other diabetic kidney complication: Secondary | ICD-10-CM

## 2012-09-19 DIAGNOSIS — Z794 Long term (current) use of insulin: Secondary | ICD-10-CM

## 2012-09-19 DIAGNOSIS — K5730 Diverticulosis of large intestine without perforation or abscess without bleeding: Secondary | ICD-10-CM | POA: Diagnosis present

## 2012-09-19 DIAGNOSIS — I129 Hypertensive chronic kidney disease with stage 1 through stage 4 chronic kidney disease, or unspecified chronic kidney disease: Secondary | ICD-10-CM | POA: Diagnosis present

## 2012-09-19 DIAGNOSIS — IMO0002 Reserved for concepts with insufficient information to code with codable children: Secondary | ICD-10-CM | POA: Diagnosis present

## 2012-09-19 HISTORY — DX: Unspecified convulsions: R56.9

## 2012-09-19 LAB — POCT I-STAT 3, ART BLOOD GAS (G3+)
Acid-base deficit: 3 mmol/L — ABNORMAL HIGH (ref 0.0–2.0)
Bicarbonate: 24.2 mEq/L — ABNORMAL HIGH (ref 20.0–24.0)
Bicarbonate: 23.1 mEq/L (ref 20.0–24.0)
Patient temperature: 98.6
TCO2: 26 mmol/L (ref 0–100)
TCO2: 24 mmol/L (ref 0–100)
pCO2 arterial: 54.8 mmHg — ABNORMAL HIGH (ref 35.0–45.0)
pH, Arterial: 7.254 — ABNORMAL LOW (ref 7.350–7.450)
pO2, Arterial: 83 mmHg (ref 80.0–100.0)
pO2, Arterial: 82 mmHg (ref 80.0–100.0)

## 2012-09-19 LAB — URINE MICROSCOPIC-ADD ON

## 2012-09-19 LAB — URINALYSIS, ROUTINE W REFLEX MICROSCOPIC
Bilirubin Urine: NEGATIVE
Leukocytes, UA: NEGATIVE
Nitrite: NEGATIVE
Specific Gravity, Urine: 1.013 (ref 1.005–1.030)
pH: 6.5 (ref 5.0–8.0)

## 2012-09-19 LAB — BASIC METABOLIC PANEL
CO2: 22 mEq/L (ref 19–32)
Chloride: 110 mEq/L (ref 96–112)
GFR calc non Af Amer: 28 mL/min — ABNORMAL LOW (ref >90)
Sodium: 143 mEq/L (ref 135–145)

## 2012-09-19 LAB — GLUCOSE, CAPILLARY
Glucose-Capillary: 338 mg/dL — ABNORMAL HIGH (ref 70–99)
Glucose-Capillary: 254 mg/dL — ABNORMAL HIGH (ref 70–99)
Glucose-Capillary: 183 mg/dL — ABNORMAL HIGH (ref 70–99)

## 2012-09-19 LAB — TYPE AND SCREEN: Antibody Screen: NEGATIVE

## 2012-09-19 LAB — POCT I-STAT, CHEM 8
Creatinine, Ser: 1.7 mg/dL — ABNORMAL HIGH (ref 0.50–1.35)
HCT: 44 % (ref 39.0–52.0)
Hemoglobin: 15 g/dL (ref 13.0–17.0)
Potassium: 4.2 mEq/L (ref 3.5–5.1)
Sodium: 145 mEq/L (ref 135–145)

## 2012-09-19 LAB — CBC
HCT: 41.1 % (ref 39.0–52.0)
HCT: 34.3 % — ABNORMAL LOW (ref 39.0–52.0)
Hemoglobin: 13.9 g/dL (ref 13.0–17.0)
Hemoglobin: 11.6 g/dL — ABNORMAL LOW (ref 13.0–17.0)
MCH: 26.9 pg (ref 26.0–34.0)
MCV: 79.5 fL (ref 78.0–100.0)
RBC: 5.17 MIL/uL (ref 4.22–5.81)
RBC: 4.33 MIL/uL (ref 4.22–5.81)
WBC: 7.8 10*3/uL (ref 4.0–10.5)
WBC: 4.4 10*3/uL (ref 4.0–10.5)

## 2012-09-19 LAB — PRO B NATRIURETIC PEPTIDE: Pro B Natriuretic peptide (BNP): 2818 pg/mL — ABNORMAL HIGH (ref 0–125)

## 2012-09-19 LAB — COMPREHENSIVE METABOLIC PANEL
ALT: 57 U/L — ABNORMAL HIGH (ref 0–53)
BUN: 28 mg/dL — ABNORMAL HIGH (ref 6–23)
CO2: 22 mEq/L (ref 19–32)
Calcium: 9.7 mg/dL (ref 8.4–10.5)
GFR calc Af Amer: 44 mL/min — ABNORMAL LOW (ref >90)
GFR calc non Af Amer: 38 mL/min — ABNORMAL LOW (ref >90)
Glucose, Bld: 377 mg/dL — ABNORMAL HIGH (ref 70–99)
Sodium: 141 mEq/L (ref 135–145)

## 2012-09-19 LAB — DIFFERENTIAL
Eosinophils Relative: 4 % (ref 0–5)
Lymphocytes Relative: 54 % — ABNORMAL HIGH (ref 12–46)
Lymphs Abs: 4.2 10*3/uL — ABNORMAL HIGH (ref 0.7–4.0)
Monocytes Absolute: 0.5 10*3/uL (ref 0.1–1.0)
Monocytes Relative: 7 % (ref 3–12)

## 2012-09-19 LAB — ABO/RH: ABO/RH(D): A POS

## 2012-09-19 LAB — TROPONIN I
Troponin I: <0.3 ng/mL (ref <0.30)
Troponin I: 0.31 ng/mL (ref <0.30)
Troponin I: <0.3 ng/mL (ref <0.30)

## 2012-09-19 LAB — POCT I-STAT TROPONIN I

## 2012-09-19 LAB — LACTIC ACID, PLASMA: Lactic Acid, Venous: 3.8 mmol/L — ABNORMAL HIGH (ref 0.5–2.2)

## 2012-09-19 LAB — RAPID URINE DRUG SCREEN, HOSP PERFORMED
Barbiturates: NOT DETECTED
Cocaine: NOT DETECTED

## 2012-09-19 LAB — MRSA PCR SCREENING: MRSA by PCR: NEGATIVE

## 2012-09-19 LAB — SEDIMENTATION RATE: Sed Rate: 15 mm/hr (ref 0–16)

## 2012-09-19 MED ORDER — CHLORHEXIDINE GLUCONATE 0.12 % MT SOLN
OROMUCOSAL | Status: AC
  Administered 2012-09-19: 15 mL
  Filled 2012-09-19: qty 15

## 2012-09-19 MED ORDER — MIDAZOLAM HCL 2 MG/2ML IJ SOLN
4.0000 mg | Freq: Once | INTRAMUSCULAR | Status: AC
  Administered 2012-09-19: 4 mg via INTRAVENOUS

## 2012-09-19 MED ORDER — FUROSEMIDE 10 MG/ML IJ SOLN
60.0000 mg | Freq: Three times a day (TID) | INTRAMUSCULAR | Status: DC
  Administered 2012-09-19 – 2012-09-20 (×3): 60 mg via INTRAVENOUS
  Filled 2012-09-19 (×4): qty 6

## 2012-09-19 MED ORDER — CARVEDILOL 25 MG PO TABS
25.0000 mg | ORAL_TABLET | Freq: Two times a day (BID) | ORAL | Status: DC
  Filled 2012-09-19 (×5): qty 1

## 2012-09-19 MED ORDER — SUCCINYLCHOLINE CHLORIDE 20 MG/ML IJ SOLN
120.0000 mg | Freq: Once | INTRAMUSCULAR | Status: AC
  Administered 2012-09-19: 120 mg via INTRAVENOUS

## 2012-09-19 MED ORDER — FENTANYL BOLUS VIA INFUSION
25.0000 ug | Freq: Four times a day (QID) | INTRAVENOUS | Status: DC | PRN
  Administered 2012-09-19 (×2): 100 ug via INTRAVENOUS
  Filled 2012-09-19: qty 100

## 2012-09-19 MED ORDER — FUROSEMIDE 10 MG/ML IJ SOLN
INTRAMUSCULAR | Status: AC
  Filled 2012-09-19: qty 8

## 2012-09-19 MED ORDER — INSULIN ASPART 100 UNIT/ML ~~LOC~~ SOLN
8.0000 [IU] | Freq: Once | SUBCUTANEOUS | Status: AC
  Administered 2012-09-19: 8 [IU] via SUBCUTANEOUS

## 2012-09-19 MED ORDER — NITROGLYCERIN 2 % TD OINT
1.0000 [in_us] | TOPICAL_OINTMENT | Freq: Four times a day (QID) | TRANSDERMAL | Status: DC | PRN
  Filled 2012-09-19: qty 30

## 2012-09-19 MED ORDER — ENOXAPARIN SODIUM 40 MG/0.4ML ~~LOC~~ SOLN
40.0000 mg | SUBCUTANEOUS | Status: DC
  Administered 2012-09-19 – 2012-09-21 (×3): 40 mg via SUBCUTANEOUS
  Filled 2012-09-19 (×3): qty 0.4

## 2012-09-19 MED ORDER — ETOMIDATE 2 MG/ML IV SOLN
20.0000 mg | Freq: Once | INTRAVENOUS | Status: AC
  Administered 2012-09-19: 20 mg via INTRAVENOUS

## 2012-09-19 MED ORDER — PROPOFOL 10 MG/ML IV EMUL
5.0000 ug/kg/min | INTRAVENOUS | Status: DC
  Administered 2012-09-19 (×2): 40 ug/kg/min via INTRAVENOUS
  Administered 2012-09-19: 30 ug/kg/min via INTRAVENOUS
  Administered 2012-09-19: 25 ug/kg/min via INTRAVENOUS
  Administered 2012-09-20: 50 ug/kg/min via INTRAVENOUS
  Administered 2012-09-20: 40 ug/kg/min via INTRAVENOUS
  Administered 2012-09-20: 30 ug/kg/min via INTRAVENOUS
  Administered 2012-09-20: 20 ug/kg/min via INTRAVENOUS
  Administered 2012-09-21: 25 ug/kg/min via INTRAVENOUS
  Administered 2012-09-21 (×2): 30 ug/kg/min via INTRAVENOUS
  Administered 2012-09-22 (×2): 20 ug/kg/min via INTRAVENOUS
  Administered 2012-09-22: 35 ug/kg/min via INTRAVENOUS
  Administered 2012-09-22: 15 ug/kg/min via INTRAVENOUS
  Administered 2012-09-22: 35 ug/kg/min via INTRAVENOUS
  Administered 2012-09-23 (×2): 20 ug/kg/min via INTRAVENOUS
  Administered 2012-09-24: 15 ug/kg/min via INTRAVENOUS
  Administered 2012-09-24: 20 ug/kg/min via INTRAVENOUS
  Administered 2012-09-25: 30 ug/kg/min via INTRAVENOUS
  Administered 2012-09-25: 50 ug/kg/min via INTRAVENOUS
  Administered 2012-09-25 (×2): 20 ug/kg/min via INTRAVENOUS
  Administered 2012-09-25: 15 ug/kg/min via INTRAVENOUS
  Administered 2012-09-26: 30 ug/kg/min via INTRAVENOUS
  Administered 2012-09-26: 50 ug/kg/min via INTRAVENOUS
  Administered 2012-09-27 – 2012-09-28 (×3): 10 ug/kg/min via INTRAVENOUS
  Filled 2012-09-19 (×34): qty 100

## 2012-09-19 MED ORDER — PANTOPRAZOLE SODIUM 40 MG IV SOLR
40.0000 mg | Freq: Every day | INTRAVENOUS | Status: DC
  Administered 2012-09-19 – 2012-09-25 (×7): 40 mg via INTRAVENOUS
  Filled 2012-09-19 (×7): qty 40

## 2012-09-19 MED ORDER — HYDRALAZINE HCL 20 MG/ML IJ SOLN
10.0000 mg | INTRAMUSCULAR | Status: DC | PRN
  Administered 2012-09-19 – 2012-09-22 (×4): 20 mg via INTRAVENOUS
  Filled 2012-09-19 (×2): qty 1
  Filled 2012-09-19: qty 2
  Filled 2012-09-19: qty 1

## 2012-09-19 MED ORDER — FENTANYL CITRATE 0.05 MG/ML IJ SOLN
INTRAMUSCULAR | Status: AC
  Filled 2012-09-19: qty 2

## 2012-09-19 MED ORDER — FENTANYL CITRATE 0.05 MG/ML IJ SOLN
100.0000 ug | Freq: Once | INTRAMUSCULAR | Status: AC
  Administered 2012-09-19: 100 ug via INTRAVENOUS

## 2012-09-19 MED ORDER — INSULIN GLARGINE 100 UNIT/ML ~~LOC~~ SOLN
20.0000 [IU] | Freq: Every day | SUBCUTANEOUS | Status: DC
  Administered 2012-09-20 – 2012-09-25 (×6): 20 [IU] via SUBCUTANEOUS
  Filled 2012-09-19 (×8): qty 0.2

## 2012-09-19 MED ORDER — CHLORHEXIDINE GLUCONATE 0.12 % MT SOLN
15.0000 mL | Freq: Two times a day (BID) | OROMUCOSAL | Status: DC
  Administered 2012-09-19 – 2012-10-02 (×26): 15 mL via OROMUCOSAL
  Filled 2012-09-19 (×25): qty 15

## 2012-09-19 MED ORDER — BIOTENE DRY MOUTH MT LIQD
15.0000 mL | Freq: Four times a day (QID) | OROMUCOSAL | Status: DC
  Administered 2012-09-20 – 2012-10-02 (×51): 15 mL via OROMUCOSAL

## 2012-09-19 MED ORDER — HYDRALAZINE HCL 25 MG PO TABS
25.0000 mg | ORAL_TABLET | Freq: Three times a day (TID) | ORAL | Status: DC
  Administered 2012-09-19 – 2012-09-20 (×5): 25 mg via ORAL
  Filled 2012-09-19 (×9): qty 1

## 2012-09-19 MED ORDER — FUROSEMIDE 10 MG/ML IJ SOLN
60.0000 mg | Freq: Four times a day (QID) | INTRAMUSCULAR | Status: DC
  Administered 2012-09-19: 60 mg via INTRAVENOUS

## 2012-09-19 MED ORDER — SODIUM CHLORIDE 0.9 % IV SOLN
250.0000 mL | INTRAVENOUS | Status: DC | PRN
  Administered 2012-09-19: 250 mL via INTRAVENOUS

## 2012-09-19 MED ORDER — LIDOCAINE HCL (CARDIAC) 20 MG/ML IV SOLN
INTRAVENOUS | Status: AC
  Filled 2012-09-19: qty 5

## 2012-09-19 MED ORDER — INSULIN ASPART 100 UNIT/ML ~~LOC~~ SOLN
2.0000 [IU] | SUBCUTANEOUS | Status: DC
  Administered 2012-09-19: 4 [IU] via SUBCUTANEOUS
  Administered 2012-09-20 – 2012-09-24 (×6): 2 [IU] via SUBCUTANEOUS
  Administered 2012-09-24 (×2): 4 [IU] via SUBCUTANEOUS
  Administered 2012-09-24: 2 [IU] via SUBCUTANEOUS
  Administered 2012-09-25 (×4): 4 [IU] via SUBCUTANEOUS
  Administered 2012-09-25: 2 [IU] via SUBCUTANEOUS
  Administered 2012-09-25 – 2012-09-26 (×2): 4 [IU] via SUBCUTANEOUS
  Administered 2012-09-26: 2 [IU] via SUBCUTANEOUS
  Administered 2012-09-26 – 2012-09-27 (×5): 4 [IU] via SUBCUTANEOUS
  Administered 2012-09-27: 6 [IU] via SUBCUTANEOUS
  Administered 2012-09-27 (×2): 4 [IU] via SUBCUTANEOUS
  Administered 2012-09-27: 6 [IU] via SUBCUTANEOUS
  Administered 2012-09-28: 4 [IU] via SUBCUTANEOUS
  Administered 2012-09-28: 6 [IU] via SUBCUTANEOUS
  Administered 2012-09-28: 4 [IU] via SUBCUTANEOUS
  Administered 2012-09-28: 6 [IU] via SUBCUTANEOUS
  Administered 2012-09-28 (×2): 4 [IU] via SUBCUTANEOUS
  Administered 2012-09-28: 2 [IU] via SUBCUTANEOUS
  Administered 2012-09-29: 6 [IU] via SUBCUTANEOUS
  Administered 2012-09-29: 4 [IU] via SUBCUTANEOUS

## 2012-09-19 MED ORDER — PROPOFOL 10 MG/ML IV EMUL
INTRAVENOUS | Status: AC
  Administered 2012-09-19: 1000 mg
  Filled 2012-09-19: qty 100

## 2012-09-19 MED ORDER — ROCURONIUM BROMIDE 50 MG/5ML IV SOLN
INTRAVENOUS | Status: AC
  Filled 2012-09-19: qty 2

## 2012-09-19 MED ORDER — ASPIRIN 300 MG RE SUPP
300.0000 mg | RECTAL | Status: AC
  Administered 2012-09-19: 300 mg via RECTAL
  Filled 2012-09-19: qty 1

## 2012-09-19 MED ORDER — ETOMIDATE 2 MG/ML IV SOLN
INTRAVENOUS | Status: AC
  Filled 2012-09-19: qty 20

## 2012-09-19 MED ORDER — ASPIRIN 81 MG PO CHEW: 324.0000 mg | CHEWABLE_TABLET | ORAL | Status: AC

## 2012-09-19 MED ORDER — MIDAZOLAM HCL 2 MG/2ML IJ SOLN
INTRAMUSCULAR | Status: AC
  Filled 2012-09-19: qty 4

## 2012-09-19 MED ORDER — FUROSEMIDE 10 MG/ML IJ SOLN
INTRAMUSCULAR | Status: AC
  Filled 2012-09-19: qty 4

## 2012-09-19 MED ORDER — SODIUM CHLORIDE 0.9 % IV BOLUS (SEPSIS)
1000.0000 mL | Freq: Once | INTRAVENOUS | Status: AC
  Administered 2012-09-19: 1000 mL via INTRAVENOUS

## 2012-09-19 MED ORDER — SUCCINYLCHOLINE CHLORIDE 20 MG/ML IJ SOLN
INTRAMUSCULAR | Status: AC
  Filled 2012-09-19: qty 1

## 2012-09-19 MED ORDER — SODIUM CHLORIDE 0.9 % IV SOLN
25.0000 ug/h | INTRAVENOUS | Status: DC
  Administered 2012-09-19: 25 ug/h via INTRAVENOUS
  Administered 2012-09-19: 50 ug/h via INTRAVENOUS
  Administered 2012-09-20: 150 ug/h via INTRAVENOUS
  Administered 2012-09-20: 100 ug/h via INTRAVENOUS
  Administered 2012-09-20: 150 ug/h via INTRAVENOUS
  Administered 2012-09-21: 200 ug/h via INTRAVENOUS
  Administered 2012-09-21 – 2012-09-22 (×2): 150 ug/h via INTRAVENOUS
  Administered 2012-09-23: 50 ug/h via INTRAVENOUS
  Administered 2012-09-24 – 2012-09-26 (×3): 100 ug/h via INTRAVENOUS
  Administered 2012-09-26: 150 ug/h via INTRAVENOUS
  Administered 2012-09-27 – 2012-09-28 (×2): 125 ug/h via INTRAVENOUS
  Filled 2012-09-19 (×12): qty 50

## 2012-09-19 NOTE — ED Notes (Signed)
Pt removed I/O, grabbing for ET tube.

## 2012-09-19 NOTE — ED Notes (Signed)
Critical lab results shown to Dr. Christy Gentles

## 2012-09-19 NOTE — Progress Notes (Signed)
Responded to page to be provide support to Pt. Family already in consultation room. Pt. Report to ED after complaining of shortness of breathe.  Pt. Being admitted. Will follow as needed.  09/19/12 0400  Clinical Encounter Type  Visited With Patient;Family;Health care provider  Visit Type Spiritual support;ED  Referral From Nurse  Spiritual Encounters  Spiritual Needs Prayer;Emotional  Stress Factors  Family Stress Factors Exhausted

## 2012-09-19 NOTE — Progress Notes (Signed)
PULMONARY  / CRITICAL CARE MEDICINE  Name: Corey [name] MRN: [id number] DOB: 1946-04-07    ADMISSION DATE:  09/19/2012 CONSULTATION DATE:  09/19/2012  REFERRING MD :  Christy Gentles PRIMARY SERVICE: PCCM  CHIEF COMPLAINT:  Acute respiratory failure  BRIEF PATIENT DESCRIPTION: 67 y/o male with LVH and diastolic dysfunction presented by EMS on 5/8 to the [facility] ED after he had the sudden onset of respiratory failure at home.  He had a seizure and brief cardiac arrest requiring <54min of CPR.    SIGNIFICANT EVENTS / STUDIES:  5/8 admission >>  LINES / TUBES: 5/8 ETT >> 5/8 peripheral IV >>  CULTURES: None  ANTIBIOTICS: None  SUBJECTIVE:   VITAL SIGNS: Temp:  [96.3 F (35.7 C)-98.8 F (37.1 C)] 98.4 F (36.9 C) (05/08 0826) Pulse Rate:  [44-102] 67 (05/08 0910) Resp:  [0-27] 21 (05/08 0910) BP: (88-270)/(66-165) 154/83 mmHg (05/08 0910) SpO2:  [43 %-100 %] 100 % (05/08 0910) FiO2 (%):  [60 %-100 %] 60 % (05/08 0910) Weight:  [100.7 kg (222 lb 0.1 oz)-102 kg (224 lb 13.9 oz)] 102 kg (224 lb 13.9 oz) (05/08 0630) HEMODYNAMICS:   VENTILATOR SETTINGS: Vent Mode:  [-] PRVC FiO2 (%):  [60 %-100 %] 60 % Set Rate:  [18 bmp-22 bmp] 22 bmp Vt Set:  [600 mL] 600 mL PEEP:  [5 cmH20-10 cmH20] 5 cmH20 Plateau Pressure:  [21 cmH20-28 cmH20] 25 cmH20 INTAKE / OUTPUT: Intake/Output     05/07 0701 - 05/08 0700 05/08 0701 - 05/09 0700   Urine (mL/kg/hr) 250    Other 375    Total Output 625     Net -625           PHYSICAL EXAMINATION: Gen: sedated on vent, intubated HEENT: NCAT, PERRL, EOMi, ETT in place PULM: Rhonchi B CV: RRR, distant heart sounds, no clear JVD AB: BS+, soft, nontender, no hsm Ext: warm, trace pretibial edema, no clubbing, no cyanosis Derm: no rash or skin breakdown Neuro: sedated on vent but arouses to voice, reaches for tube with both hands, withdraws to pain  LABS:  Recent Labs Lab 09/19/12 0333 09/19/12 0341 09/19/12 0421 09/19/12 0432 09/19/12 0505  09/19/12 0527 09/19/12 0800  HGB 13.9 15.0  --   --   --  11.6*  --   WBC 7.8  --   --   --   --  4.4  --   PLT 224  --   --   --   --  188  --   NA 141 145  --   --   --   --   --   K 4.5 4.2  --   --   --   --   --   CL 106 110  --   --   --   --   --   CO2 22  --   --   --   --   --   --   GLUCOSE 377* 364*  --   --   --   --   --   BUN 28* 31*  --   --   --   --   --   CREATININE 1.80* 1.70*  --   --   --   --  1.94*  CALCIUM 9.7  --   --   --   --   --   --   AST 146*  --   --   --   --   --   --  ALT 57*  --   --   --   --   --   --   ALKPHOS 81  --   --   --   --   --   --   BILITOT 0.4  --   --   --   --   --   --   PROT 7.8  --   --   --   --   --   --   ALBUMIN 3.7  --   --   --   --   --   --   APTT 27  --   --   --   --   --   --   INR 1.16  --   --   --   --   --   --   LATICACIDVEN  --   --   --   --   --   --  3.8*  TROPONINI <0.30  --   --   --   --  <0.30  --   PROBNP  --   --   --  2818.0*  --   --   --   PHART  --   --  7.254*  --  7.331*  --   --   PCO2ART  --   --  54.8*  --  43.7  --   --   PO2ART  --   --  83.0  --  82.0  --   --     Recent Labs Lab 09/19/12 0322 09/19/12 0436 09/19/12 0834  GLUCAP 338* 330* 254*    5/8 CXR: Cardiomegally, ETT in place, pulm edema 5/8 EKG: Sinus tach, IVCD, ST depression inferior/lateral leads  ASSESSMENT / PLAN:  PULMONARY A: Acute respiratory failure due to pulmonary edema P:   - Full vent support until PEEP/FiO2 can be decreased. - Daily CXR/ABG - Daily WUA/SBT - Gentle diureses today  CARDIOVASCULAR A: Acute on chronic CHF presumably due to hypertensive emergency Hypertrophic, obstructive cardiomyopathy Hypertension, wife reports occasional medication non-compliance  P:  - Resume home BP meds - Lasix x3 on 5/8 - Repeat echo on 5/8 considering large cardiac silhouette and known pericardial effusion - Tele - Consult cardiology pending  RENAL A:  CKD, at baseline P:   - Foley - Monitor UOP -  Lasix as ordered  GASTROINTESTINAL A:  No acute issues P:   - PPI for stress ulcer prophylaxis - If not extubatable by 5/9 will start TF.  HEMATOLOGIC A:  No acute issues P:  - Monitor CBC.  INFECTIOUS A:  No acute issues P:   - Monitor fever curve  ENDOCRINE A:  DM2, on 70/30 35 units qHS at home P:   - Glargine 20 units scheduled. - ICU hyperglycemia protocol.  NEUROLOGIC A:  Acute encephalopathy due to hypoxemia; improved mental status after intubation ?Seizure at home> currently mental status is OK, hypoxemia most likely explanation which has now resolved P:   - Monitor neuro status closely - Fentanyl/propofol for sedation titrated to RASS -1. - Hold further anti-epileptic medication unless clear evidence of seizure.  TODAY'S SUMMARY: Titrate vent settings down as tolerated, if diureses well and down to a reasonable PEEP/FiO2 then will consider weaning.   I have personally obtained a history, examined the patient, evaluated laboratory and imaging results, formulated the assessment and plan and placed orders.  CRITICAL CARE: The patient is critically ill with multiple organ systems failure  and requires high complexity decision making for assessment and support, frequent evaluation and titration of therapies, application of advanced monitoring technologies and extensive interpretation of multiple databases. Critical Care Time devoted to patient care services described in this note is 30 minutes.   Jef Futch,MD Pulmonary and [facility] Pager: [phone]  09/19/2012, 10:40 AM

## 2012-09-19 NOTE — Progress Notes (Signed)
CRITICAL VALUE ALERT  Critical value received:  Troponin 0.31  Date of notification:  09/19/2012   Time of notification:1515    Critical value read back:yes  Nurse who received alert:  Glean Hess MRN  MD notified (1st page):  Dr. Harrington Challenger  Time of first page:  1530    Responding MD:  Dr. Harrington Challenger  Time MD responded:  1600

## 2012-09-19 NOTE — Progress Notes (Addendum)
Vent changes made per ABG results and MD approval.

## 2012-09-19 NOTE — H&P (Addendum)
PULMONARY  / CRITICAL CARE MEDICINE  Name: Corey [name] MRN: [id number] DOB: [birth year]    ADMISSION DATE:  09/19/2012 CONSULTATION DATE:  09/19/2012  REFERRING MD :  Christy Gentles PRIMARY SERVICE: PCCM  CHIEF COMPLAINT:  Acute respiratory failure  BRIEF PATIENT DESCRIPTION: 67 y/o male with LVH and diastolic dysfunction presented by EMS on 5/8 to the [facility] ED after he had the sudden onset of respiratory failure at home.  He had a seizure and brief cardiac arrest requiring <33min of CPR.    SIGNIFICANT EVENTS / STUDIES:  5/8 admission >>  LINES / TUBES: 5/8 ETT >> 5/8 peripheral IV >>  CULTURES:   ANTIBIOTICS:   HISTORY OF PRESENT ILLNESS:  67 y/o male with LVH and diastolic dysfunction presented by EMS on 5/8 to the [facility] ED after he had the sudden onset of respiratory failure at home.  He had a seizure and brief cardiac arrest requiring <57min of CPR.   His wife says that he had chills but no other symptoms on 5/7.  Early in the morning on 5/8 he awoke suddenly complaining of dyspnea.  Within minutes he stopped breathing and then had a brief episode of convulsive movements followed by unresponsiveness. On his arrival to the [facility] ED he was confused and combative and hypoxemic so he was intubated.  PAST MEDICAL HISTORY :  Past Medical History  Diagnosis Date  . Renal insufficiency   . Diabetes mellitus   . Hypertension   . Gout   . Colon polyp 2003    Dr [name]; F/U was to be 2008( not completed)  . Diverticulosis 2003  . Pneumonia 09/29/11    Avelox X 10 daysas OP   Past Surgical History  Procedure Laterality Date  . Refractive surgery  2005  . Colonoscopy w/ polypectomy  2003  . Wrist aspiration  02/16/12     monosodium urate crystals; Dr Caralyn Guile   Prior to Admission medications   Medication Sig Start Date End Date Taking? Authorizing Provider  amLODipine (NORVASC) 10 MG tablet Take 10 mg by mouth daily.   Yes Historical Provider, MD  carvedilol (COREG) 25 MG tablet Take 25 mg  by mouth 2 (two) times daily with a meal.   Yes Historical Provider, MD  colchicine 0.6 MG tablet Because of kidney impairment; take one pill every Monday, Wednesday, Friday, and Sunday 02/19/12  Yes Hendricks Limes, MD  furosemide (LASIX) 40 MG tablet Take 40 mg by mouth daily. For swelling   Yes Historical Provider, MD  hydrALAZINE (APRESOLINE) 25 MG tablet Take 25 mg by mouth 3 (three) times daily.    Yes Historical Provider, MD  insulin lispro protamine-insulin lispro (HUMALOG MIX 75/25 KWIKPEN) (75-25) 100 UNIT/ML SUSP Inject 35 Units into the skin daily with supper.    Yes Historical Provider, MD   Allergies  Allergen Reactions  . Hydrochlorothiazide     Gout , uncontrolled diabetes and renal insufficiency  . Allopurinol     Renal insufficiency    FAMILY HISTORY:  Family History  Problem Relation Age of Onset  . Hypertension Father   . Hypertension Mother   . Cancer Father     prostate  . Diabetes Mother   . Diabetes Father    SOCIAL HISTORY:  reports that he quit smoking about 44 years ago. He does not have any smokeless tobacco history on file. He reports that he drinks about 1.2 ounces of alcohol per week. He reports that he does not use illicit drugs.  REVIEW OF SYSTEMS:   Cannot obtain  SUBJECTIVE:   VITAL SIGNS: Temp:  [97.7 F (36.5 C)-98.8 F (37.1 C)] 97.7 F (36.5 C) (05/08 0515) Pulse Rate:  [55-102] 55 (05/08 0515) Resp:  [16-27] 22 (05/08 0515) BP: (88-270)/(66-165) 103/77 mmHg (05/08 0445) SpO2:  [43 %-100 %] 99 % (05/08 0515) FiO2 (%):  [80 %-100 %] 80 % (05/08 0445) HEMODYNAMICS:   VENTILATOR SETTINGS: Vent Mode:  [-] PRVC FiO2 (%):  [80 %-100 %] 80 % Set Rate:  [18 bmp-22 bmp] 22 bmp Vt Set:  [600 mL] 600 mL PEEP:  [5 cmH20-10 cmH20] 5 cmH20 Plateau Pressure:  [21 cmH20] 21 cmH20 INTAKE / OUTPUT: Intake/Output     05/07 0701 - 05/08 0700   Other 375   Total Output 375   Net -375         PHYSICAL EXAMINATION:  Gen: sedated on vent,  intubated HEENT: NCAT, PERRL, EOMi, ETT in place PULM: Rhonchi B CV: RRR, distant heart sounds, no clear JVD AB: BS+, soft, nontender, no hsm Ext: warm, trace pretibial edema, no clubbing, no cyanosis Derm: no rash or skin breakdown Neuro: sedated on vent but arouses to voice, reaches for tube with both hands, withdraws to pain   LABS:  Recent Labs Lab 09/19/12 0333 09/19/12 0341 09/19/12 0421 09/19/12 0505  HGB 13.9 15.0  --   --   WBC 7.8  --   --   --   PLT 224  --   --   --   NA  --  145  --   --   K  --  4.2  --   --   CL  --  110  --   --   GLUCOSE  --  364*  --   --   BUN  --  31*  --   --   CREATININE  --  1.70*  --   --   APTT 27  --   --   --   INR 1.16  --   --   --   TROPONINI <0.30  --   --   --   PHART  --   --  7.254* 7.331*  PCO2ART  --   --  54.8* 43.7  PO2ART  --   --  83.0 82.0    Recent Labs Lab 09/19/12 0322 09/19/12 0436  GLUCAP 338* 330*    5/8 CXR: Cardiomegally, ETT in place, pulm edema 5/8 EKG: Sinus tach, IVCD, ST depression inferior/lateral leads  ASSESSMENT / PLAN:  PULMONARY A: Acute respiratory failure due to pulmonary edema P:   -full vent support -increase PEEP to 10, decrease FiO2 60% -daily CXR/ABG -daily WUA/SBT  CARDIOVASCULAR A: Acute on chronic CHF presumably due to hypertensive emergency Hypertrophic, obstructive cardiomyopathy Hypertension, wife reports occasional medication non-compliance  P:  -resume home BP meds -lasix x3 on 5/8 -repeat echo on 5/8 considering large cardiac silhouette and known pericardial effusion -tele -rule out for MI -consult cardiology  RENAL A:  CKD, at baseline P:   -foley -monitor UOP  GASTROINTESTINAL A:  No acute issues P:   -PPI for stress ulcer prophylaxis  HEMATOLOGIC A:  No acute issues P:    INFECTIOUS A:  No acute issues P:   -monitor fever curve  ENDOCRINE A:  DM2, on 70/30 35 units qHS at home P:   -glargine 20 units scheduled -ICU hyperglycemia  protocol  NEUROLOGIC A:  Acute encephalopathy due to hypoxemia; improved mental  status after intubation ?Seizure at home> currently mental status is OK, hypoxemia most likely explanation which has now resolved P:   -monitor neuro status closely -fentanyl/propofol for sedation titrated to RASS -1 -hold further anti-epileptic medication unless clear evidence of seizure  TODAY'S SUMMARY:   Family updated at length at bedside.  I have personally obtained a history, examined the patient, evaluated laboratory and imaging results, formulated the assessment and plan and placed orders. CRITICAL CARE: The patient is critically ill with multiple organ systems failure and requires high complexity decision making for assessment and support, frequent evaluation and titration of therapies, application of advanced monitoring technologies and extensive interpretation of multiple databases. Critical Care Time devoted to patient care services described in this note is 60 minutes.   Lemmie Evens [facility] [place] Pager: [phone]  09/19/2012, 5:16 AM

## 2012-09-19 NOTE — Progress Notes (Signed)
Inpatient Diabetes Program Recommendations  AACE/ADA: New Consensus Statement on Inpatient Glycemic Control (2013)  Target Ranges:  Prepandial:   less than 140 mg/dL      Peak postprandial:   less than 180 mg/dL (1-2 hours)      Critically ill patients:  140 - 180 mg/dL   Inpatient Diabetes Program Recommendations Insulin - Basal: needs first dose NOW especially if type-1 DM Correction (SSI): start SENSITIVE scale q 4 per glycemic control order set ICU protocol is not for patient's with type-1 DM. Diabetes Coordinator called and gave recommendations to RN to communicate to attending MD.  Will follow. Thank you  Raoul Pitch BSN, RN,CDE Inpatient Diabetes Coordinator [phone] (team pager)

## 2012-09-19 NOTE — ED Provider Notes (Signed)
History     CSN: [id number]  Arrival date & time 09/19/12  0320   First MD Initiated Contact with Patient 09/19/12 [phone]      Chief Complaint  Patient presents with  . Seizures    Patient is a 67 y.o. male presenting with altered mental status. The history is provided by the EMS personnel. The history is limited by the condition of the patient.  Altered Mental Status This is a new problem. Episode onset: just prior to arrival. The problem occurs constantly. The problem has been rapidly worsening. Associated symptoms include shortness of breath. Nothing aggravates the symptoms. Nothing relieves the symptoms.  pt presents from home EMS reports they were called because patient had been reporting "not feeling well" On their arrival, pt was confused then had seizure.  After the seizure stopped spontaneously, pt became confused and combative.  His glucose was elevated over 300 per EMS.  He then became apneic/pulseless and received CPR for one minute.  EMS reports patient had PEA rhythm at that time.  He then regained pulses, woke up and was extremely combative.  He does not have a known h/o seizures.  An IO was placed by EMS.  No other details are known at time of arrival  Past Medical History  Diagnosis Date  . Renal insufficiency   . Diabetes mellitus   . Hypertension   . Gout   . Colon polyp 2003    Dr [name]; F/U was to be 2008( not completed)  . Diverticulosis 2003  . Pneumonia 09/29/11    Avelox X 10 daysas OP    Past Surgical History  Procedure Laterality Date  . Refractive surgery  2005  . Colonoscopy w/ polypectomy  2003  . Wrist aspiration  02/16/12     monosodium urate crystals; Dr Caralyn Guile    Family History  Problem Relation Age of Onset  . Hypertension Father   . Hypertension Mother   . Cancer Father     prostate  . Diabetes Mother   . Diabetes Father     History  Substance Use Topics  . Smoking status: Former Smoker    Quit date: 05/15/1968  . Smokeless  tobacco: Not on file     Comment: Quit at age 76   . Alcohol Use: 1.2 oz/week    2 Cans of beer per week      Review of Systems  Unable to perform ROS: Mental status change  Respiratory: Positive for shortness of breath.   Psychiatric/Behavioral: Positive for altered mental status.    Allergies  Hydrochlorothiazide and Allopurinol  Home Medications   Current Outpatient Rx  Name  Route  Sig  Dispense  Refill  . amLODipine (NORVASC) 10 MG tablet   Oral   Take 10 mg by mouth daily.         . carvedilol (COREG) 25 MG tablet   Oral   Take 25 mg by mouth 2 (two) times daily with a meal.         . colchicine 0.6 MG tablet      Because of kidney impairment; take one pill every Monday, Wednesday, Friday, and Sunday   30 tablet   0   . furosemide (LASIX) 40 MG tablet   Oral   Take 40 mg by mouth daily as needed. For swelling         . hydrALAZINE (APRESOLINE) 25 MG tablet   Oral   Take 25 mg by mouth 3 (three) times daily.          Marland Kitchen  insulin lispro protamine-insulin lispro (HUMALOG MIX 75/25 KWIKPEN) (75-25) 100 UNIT/ML SUSP   Subcutaneous   Inject 25 Units into the skin daily with breakfast.          . naproxen sodium (ALEVE) 220 MG tablet   Oral   Take 1 tablet (220 mg total) by mouth 2 (two) times daily with a meal.   20 tablet   0    Vitals - severe hypertension noted.  Pt is afebrile.    Physical Exam  Nursing note and vitals reviewed.  CONSTITUTIONAL: combative, agitated HEAD: Normocephalic/atraumatic EYES: PERRL, no nystagmus ENMT: Mucous membranes moist, poor dentition NECK: supple no meningeal signs SPINE:no bruising noted to spine CV: S1/S2 noted, no loud murmurs auscultated LUNGS: tachypneic, coarse/equal BS noted bilaterally ABDOMEN: soft, obese  NEURO: Pt is combative and extremely agitated, not following commands and nonverbal.  He moves all extremitiesx4 GCS 9 on arrival EXTREMITIES: pulses normal/equal X4,no deformity noted IO  noted in left LE (placed by EMS) SKIN: warm, color normal PSYCH: anxious  ED Course  Procedures  CRITICAL CARE Performed by: Sharyon Cable Total critical care time: 33 Critical care time was exclusive of separately billable procedures and treating other patients. Critical care was necessary to treat or prevent imminent or life-threatening deterioration. Critical care was time spent personally by me on the following activities: development of treatment plan with patient and/or surrogate as well as nursing, discussions with consultants, evaluation of patient's response to treatment, examination of patient, obtaining history from patient or surrogate, ordering and performing treatments and interventions, ordering and review of laboratory studies, ordering and review of radiographic studies, pulse oximetry and re-evaluation of patient's condition.  INTUBATION Performed by: Sharyon Cable  Required items: required devices, and special equipment available Patient identity confirmed: provided demographic data and hospital-assigned identification number Time out: not called due to emergent nature of procedure  Indications: altered mental status, impending respiratory failure  Intubation method: Glidescope Laryngoscopy   Preoxygenation: BVM  Sedatives: Etomidate Paralytic: Succinylcholine  Tube Size: 7.5 cuffed  Post-procedure assessment: chest rise and ETCO2 monitor Breath sounds: equal and absent over the epigastrium Tube secured with: ETT holder Chest x-ray interpreted by radiologist and me.  Chest x-ray findings: endotracheal tube in appropriate position  Patient tolerated the procedure well with no immediate complications.    Labs Reviewed  POCT I-STAT, CHEM 8 - Abnormal; Notable for the following:    BUN 31 (*)    Creatinine, Ser 1.70 (*)    Glucose, Bld 364 (*)    Calcium, Ion 1.35 (*)    All other components within normal limits  POCT I-STAT TROPONIN I -  Abnormal; Notable for the following:    Troponin i, poc 0.09 (*)    All other components within normal limits  URINE CULTURE  ETHANOL  PROTIME-INR  APTT  CBC  DIFFERENTIAL  COMPREHENSIVE METABOLIC PANEL  TROPONIN I  URINE RAPID DRUG SCREEN (HOSP PERFORMED)  URINALYSIS, ROUTINE W REFLEX MICROSCOPIC  TYPE AND SCREEN   Pt seen on arrival, pt very ill appearing with significant confusion, combativeness.  He was intubated for airway protection and to prevent impending respiratory failure.  He was placed on propofol drip and sent to CT imaging for further evaluation I spoke to family, and they report he had been at his baseline, but this evening reports he did not feel well and reported shortness of breath 4:28 AM CT head negative I spoke to family and updated them on plan of care His BP  is improving dramatically with propofol Hypertensive encephalopathy is possible, versus impending acute respiratory failure due to acute pulmonary edema No signs of ICH at this time I spoke to critical care who will admit patient MDM  Nursing notes including past medical history and social history reviewed and considered in documentation xrays reviewed and considered Labs/vital reviewed and considered Previous records reviewed and considered - h/o diabetes and severe cardiomegaly.        Date: 09/19/2012  Rate: 100  Rhythm: sinus tachycardia  QRS Axis: normal  Intervals: normal  ST/T Wave abnormalities: nonspecific ST changes  Conduction Disutrbances:nonspecific intraventricular conduction delay  Narrative Interpretation:   Old EKG Reviewed: unchanged and LVH noted in prior    Sharyon Cable, MD 09/19/12 [phone]

## 2012-09-19 NOTE — Consult Note (Signed)
CARDIOLOGY CONSULT NOTE  Patient ID: Corey [name], MRN: [id number], DOB/AGE: [birth year] 67 y.o. Admit date: 09/19/2012 Date of Consult: 09/19/2012  Primary Physician: Unice Cobble, MD Primary Cardiologist: Dr. Johnsie Cancel Referring Physician: Dr. Lake Bells  Chief Complaint: Unresponsiveness Reason for Consultation: Cardiac arrest, hypertensive emergency  HPI: 67 y.o. male w/ PMHx significant for Hypertrophic Cardiomyopathy (EF 0000000, grade 2 diastolic dysfunction), HTN, DM CKD, who presented to [facility] on 09/19/2012 with complaints of respiratory failure.  The patient awoke on the morning of admission with sudden-onset dyspnea and "not feeling well".  EMS was called, and shortly after arrival the patient was noted to have a brief episode of convulsive activity.  He progressed to apnea and respiratory failure, was found to be in PEA, and CPR was performed for approx 1 minute, after which the patient regained a pulse, though he was confused and combative.    On admission, the patient's BP was elevated, with initial BP 216/165, though later with a maximum SBP of 270.  The patient was intubated due to hypoxia and AMS.  His BP has subsequently downtrended, and is currently 154/83, after only 1 dose of IV hydralazine.  Past Medical History  Diagnosis Date  . Renal insufficiency   . Diabetes mellitus   . Hypertension   . Gout   . Colon polyp 2003    Dr [name]; F/U was to be 2008( not completed)  . Diverticulosis 2003  . Pneumonia 09/29/11    Avelox X 10 daysas OP      Surgical History:  Past Surgical History  Procedure Laterality Date  . Refractive surgery  2005  . Colonoscopy w/ polypectomy  2003  . Wrist aspiration  02/16/12     monosodium urate crystals; Dr Caralyn Guile     Home Meds: Prior to Admission medications   Medication Sig Start Date End Date Taking? Authorizing Provider  amLODipine (NORVASC) 10 MG tablet Take 10 mg by mouth daily.   Yes Historical Provider, MD   carvedilol (COREG) 25 MG tablet Take 25 mg by mouth 2 (two) times daily with a meal.   Yes Historical Provider, MD  colchicine 0.6 MG tablet Because of kidney impairment; take one pill every Monday, Wednesday, Friday, and Sunday 02/19/12  Yes Hendricks Limes, MD  furosemide (LASIX) 40 MG tablet Take 40 mg by mouth daily. For swelling   Yes Historical Provider, MD  hydrALAZINE (APRESOLINE) 25 MG tablet Take 25 mg by mouth 3 (three) times daily.    Yes Historical Provider, MD  insulin lispro protamine-insulin lispro (HUMALOG MIX 75/25 KWIKPEN) (75-25) 100 UNIT/ML SUSP Inject 35 Units into the skin daily with supper.    Yes Historical Provider, MD    Inpatient Medications:  . carvedilol  25 mg Oral BID WC  . enoxaparin (LOVENOX) injection  40 mg Subcutaneous Q24H  . furosemide  60 mg Intravenous Q6H  . hydrALAZINE  25 mg Oral TID  . insulin aspart  2-6 Units Subcutaneous Q4H  . insulin glargine  20 Units Subcutaneous QHS  . lidocaine (cardiac) 100 mg/31ml      . pantoprazole (PROTONIX) IV  40 mg Intravenous QHS  . rocuronium       . fentaNYL infusion INTRAVENOUS 50 mcg/hr (09/19/12 [id number])  . propofol 40 mcg/kg/min (09/19/12 0800)    Allergies:  Allergies  Allergen Reactions  . Hydrochlorothiazide     Gout , uncontrolled diabetes and renal insufficiency  . Allopurinol     Renal insufficiency  History   Social History  . Marital Status: Married    Spouse Name: N/A    Number of Children: N/A  . Years of Education: N/A   Occupational History  . Not on file.   Social History Main Topics  . Smoking status: Former Smoker    Quit date: 05/15/1968  . Smokeless tobacco: Not on file     Comment: Quit at age 56   . Alcohol Use: 1.2 oz/week    2 Cans of beer per week  . Drug Use: No  . Sexually Active: Not on file   Other Topics Concern  . Not on file   Social History Narrative   No salt restriction          Family History  Problem Relation Age of Onset  . Hypertension  Father   . Hypertension Mother   . Cancer Father     prostate  . Diabetes Mother   . Diabetes Father      Review of Systems: Level 5 caveat.  Unable to be performed as patient is sedated, intubated.   Physical Exam: Blood pressure 173/100, pulse 47, temperature 98.4 F (36.9 C), temperature source Oral, resp. rate 0, height 5\' 6"  (1.676 m), weight 224 lb 13.9 oz (102 kg), SpO2 100.00%. General: Well developed, well nourished, lying in bed intubated and sedated though with occasional movements of bilateral upper extremities HEENT: Normocephalic, atraumatic, sclera non-icteric, no xanthomas, nares are without discharge.  Neck: Supple. Carotids 2+ without bruits. +JVP (though difficult to assess secondary to movement) Lungs: Mechanical breath sounds, possible crackles in bilateral bases Heart: RRR with normal S1 and S2. No murmurs, rubs, or gallops appreciated. Abdomen: Soft, non-tender, non-distended with normoactive bowel sounds. No hepatomegaly. No rebound/guarding. No obvious abdominal masses. Back: No CVA tenderness Msk:  Strength and tone appear normal for age. Extremities: Tr to 1+ bilateral pitting edema to the level of the knees Neuro: sedated, intubated, spontaneous non-purposeful movements with all 4 extremities    Labs:  Recent Labs  09/19/12 0333 09/19/12 0527  TROPONINI <0.30 <0.30   Lab Results  Component Value Date   WBC 4.4 09/19/2012   HGB 11.6* 09/19/2012   HCT 34.3* 09/19/2012   MCV 79.2 09/19/2012   PLT 188 09/19/2012    Recent Labs Lab 09/19/12 0333 09/19/12 0341  NA 141 145  K 4.5 4.2  CL 106 110  CO2 22  --   BUN 28* 31*  CREATININE 1.80* 1.70*  CALCIUM 9.7  --   PROT 7.8  --   BILITOT 0.4  --   ALKPHOS 81  --   ALT 57*  --   AST PENDING  --   GLUCOSE 377* 364*   Lab Results  Component Value Date   CHOL 131 12/15/2010   HDL 50.60 12/15/2010   LDLCALC 72 12/15/2010   TRIG 40.0 12/15/2010   No results found for this basename: DDIMER     Radiology/Studies:  Ct Head Wo Contrast  09/19/2012  *RADIOLOGY REPORT*  Clinical Data: 67 year old male with seizure status post CPR. Intubated.  CT HEAD WITHOUT CONTRAST  Technique:  Contiguous axial images were obtained from the base of the skull through the vertex without contrast.  Comparison: Brain MRI 08/07/2007.  Findings: Visualized orbit soft tissues are within normal limits. Visualized scalp soft tissues are within normal limits.  Mild paranasal sinus mucosal thickening.  Mastoids are clear. No acute osseous abnormality identified.  Bulky widespread dural calcifications including along the tentorium and  at the skull base.  These may have progressed since 2009.  Mild dystrophic basal ganglia calcifications.  Normal cerebral volume.  No ventriculomegaly. Gray-white matter differentiation is within normal limits throughout the brain.  No evidence of cortically based acute infarction identified.  No midline shift or significant intracranial mass effect. No acute intracranial hemorrhage identified.  IMPRESSION: 1. No acute intracranial abnormality. 2.  Widespread peripheral and tentorial calcifications in the brain seem to be increased since the 2009 MRI.  Perhaps these progressed due to worsening renal disease (e.g. secondary hyperparathyroidism) since that time.  Widespread intracranial meningiomas is a less likely possibility.   Original Report Authenticated By: Roselyn Reef, M.D.    Dg Chest Portable 1 View  09/19/2012  *RADIOLOGY REPORT*  Clinical Data: 67 year old male status post CPR.  Respiratory distress.  Intubated.  PORTABLE CHEST - 1 VIEW  Comparison: 02/20/2012 and earlier.  Findings: Portable AP supine view 0335 hours.  Endotracheal tube tip in good position about 2 cm above the carina.  Linear artifact projecting over the left lower chest.  Severe cardiomegaly. Pulmonary vascular congestion.  No pneumothorax or pleural effusion evident on this supine view.  Retrocardiac atelectasis  suspected.  IMPRESSION: 1.  Endotracheal tube in good position, tip about 2 cm above the carina. 2.  Stable severe cardiomegaly.  Pulmonary vascular congestion/interstitial edema.   Original Report Authenticated By: Roselyn Reef, M.D.    Echocardiogram 02/29/12 - Left ventricle: The cavity size was normal. Wall thickness was increased in a pattern of severe LVH. Systolic function was normal. The estimated ejection fraction was in the range of 55% to 60%. Wall motion was normal; there were no regional wall motion abnormalities. Features are consistent with a pseudonormal left ventricular filling pattern, with concomitant abnormal relaxation and increased filling pressure (grade 2 diastolic dysfunction). - Aortic valve: Trivial regurgitation. - Mitral valve: Mild regurgitation. - Left atrium: The atrium was moderately dilated. - Atrial septum: No defect or patent foramen ovale was identified. - Pericardium, extracardiac: A pericardial effusion was identified. Features were not consistent with tamponade physiology. - Impressions: Consider F/U MRI to R/O amyloid  EKG: NSR, J-point elevation in anterior leads (seen on prior EKG), evidence of LVH  ASSESSMENT AND PLAN:  The patient is a 67 yo M, history of hypertrophic cardiomyopathy, presents after cardiopulmonary arrest with hypertensive emergency.  # Hypertensive emergency - the patient presents with initial BP 216/165, with altered mental status, pulmonary edema.  The patient's wife admits to the patient's occasional medication non-compliance.  After only 20 mg IV hydralazine, along with propofol and fentanyl, the patient's BP has decreased to 154/83 presently though it remains labile -continue hydralazine 25 mg PO TID -start lasix 60 mg IV q8hrs Follow BP and resume amlodipine and coreg as BP needs.  # Acute Respiratory Failure/VDRF - The patient presented with acute respiratory failure, with evidence of pulmonary edema on CXR.  The  etiology for this event was likely pulmonary edema due to non-compliance with lasix, exacerbated by hypertensive emergency/diastolic dysfunction.  Currently intubated, sedated.  On propofol, fentanyl. -management per CCM  # Acute on chronic CHF - echo from 2013 shows significant LVH, with grade 2 diastolic dysfunction, though with an EF of 55-60%.  Patient has been non-compliant with BP meds.  CXR shows evidence of pulmonary edema, and patient has bilateral pitting edema, consistent with volume overload.  May have been worsened by hypertensive emergency. -lasix per above -coreg as tolerated, per above -patient currently not on ACE-I  due to CKD  # Acute cardiac arrest/?PEA - EMS notes PEA after respiratory distress and convulsion activity, which reverted to sinus rhythm after approx 1 minute of CPR.  This process was likely driven by acute respiratory failure (see above). -cycle troponins  # CKD - creatinine at baseline around 1.8 -follow I/O's and Cr  Pericardial Effusion.  Etiology. unclear.  Will need to review records  Chronic  No evid for tamponade. WIll check sed rate.  Jae Dire  09/19/2012, 8:47 AM  Patient seen and examined Agree with findings as noted above  I have amended this note with my findings Patinet has responded initially with BP response.  Will continue diuresis.   I have reviewed echo today and compared to one in Oct 2013.  He has severe LVH with IVS of 27 mm Overall LV function is normal  The lateral and anterior wall motion are unchanged (low normal)  The large pericardial effusion perisists. NO evid for tamponade.

## 2012-09-19 NOTE — Progress Notes (Signed)
Echocardiogram 2D Echocardiogram has been performed.  Corey [name] 09/19/2012, 10:46 AM

## 2012-09-19 NOTE — ED Notes (Signed)
Pt arrived from via [facility]. Per EMS called unto scene for breathing trouble upon arrival pt experienced seizure. RR went down to 6 per/min. Hr went down to 30 than pulseless. CPR performed for 1 and half minutes. Pulses back to 90HR. Pt arrived to Room C postictal state

## 2012-09-19 NOTE — Progress Notes (Signed)
INITIAL NUTRITION ASSESSMENT  DOCUMENTATION CODES Per approved criteria  -Obesity Unspecified   INTERVENTION: 1. If EN warranted, recommend initiate Promote at 20 ml/hr via OG tube, this is the goal rate. 30 ml Pro-stat QID. This EN regimen would provide 880 kcal, 90 gm protein, and 403 ml free water. Propofol providing additional 797 kcal from lipids daily (total kcal 1677) Noted: would be unable to meet protein needs with increased kcal from Propofol.   NUTRITION DIAGNOSIS: Inadequate oral intake related to inability to eat  as evidenced by NPO.   Goal: Enteral nutrition to provide 60-70% of estimated calorie needs (22-25 kcals/kg ideal body weight) and 100% of estimated protein needs, based on ASPEN guidelines for permissive underfeeding in critically ill obese individuals.   Monitor:  Vent status, EN initiation, weight trends, labs, I/O's  Reason for Assessment: VRDF  67 y.o. male  Admitting Dx: VRDF   ASSESSMENT: Pt awoke on 5/8 with dyspnea within minutes he stopped breathing and then had a brief episode of convulsive movements followed by unresponsiveness. On his arrival to the [facility] ED he was confused and combative and hypoxemic so he was intubated. Pt remains intubated at this time. Recommend initiation of EN if to remain intubated >24-48 hrs.     Height: Ht Readings from Last 1 Encounters:  09/19/12 5\' 6"  (1.676 m)    Weight: Wt Readings from Last 1 Encounters:  09/19/12 224 lb 13.9 oz (102 kg)    Ideal Body Weight: 142 lbs   % Ideal Body Weight: 153%  Wt Readings from Last 10 Encounters:  09/19/12 224 lb 13.9 oz (102 kg)  07/26/12 222 lb (100.699 kg)  03/29/12 218 lb (98.884 kg)  02/23/12 219 lb (99.338 kg)  02/19/12 218 lb 9.6 oz (99.156 kg)  10/27/11 220 lb (99.791 kg)  10/05/11 220 lb 9.6 oz (100.064 kg)  09/21/11 222 lb (100.699 kg)  06/30/11 225 lb (102.059 kg)  03/02/11 224 lb (101.606 kg)    Usual Body Weight: ~220 lbs   % Usual Body Weight:  100%  BMI:  Body mass index is 36.31 kg/(m^2). Obesity class 2  Patient is currently intubated on ventilator support.  MV: 12.9 Temp:Temp (24hrs), Avg:98.2 F (36.8 C), Min:96.3 F (35.7 C), Max:98.8 F (37.1 C)  Propofol: 30.2 ml/hr- trending up (providing 797 kcal from lipids daily).    Estimated Nutritional Needs: Kcal: 2115 Underfeeding goal: 1460-1659 (22-25 kcal/kg ideal body weight) Protein:>/= 130 gm  Fluid: >/= 1.5 L   Skin: intact   Diet Order: NPO  EDUCATION NEEDS: -No education needs identified at this time   Intake/Output Summary (Last 24 hours) at 09/19/12 1034 Last data filed at 09/19/12 0639  Gross per 24 hour  Intake      0 ml  Output    625 ml  Net   -625 ml    Last BM: PTA   Labs:   Recent Labs Lab 09/19/12 0333 09/19/12 0341 09/19/12 0800  NA 141 145  --   K 4.5 4.2  --   CL 106 110  --   CO2 22  --   --   BUN 28* 31*  --   CREATININE 1.80* 1.70* 1.94*  CALCIUM 9.7  --   --   GLUCOSE 377* 364*  --     CBG (last 3)   Recent Labs  09/19/12 0322 09/19/12 0436 09/19/12 0834  GLUCAP 338* 330* 254*    Scheduled Meds: . carvedilol  25 mg Oral BID WC  .  enoxaparin (LOVENOX) injection  40 mg Subcutaneous Q24H  . furosemide      . furosemide  60 mg Intravenous Q6H  . hydrALAZINE  25 mg Oral TID  . insulin aspart  2-6 Units Subcutaneous Q4H  . insulin glargine  20 Units Subcutaneous QHS  . lidocaine (cardiac) 100 mg/54ml      . pantoprazole (PROTONIX) IV  40 mg Intravenous QHS  . rocuronium        Continuous Infusions: . fentaNYL infusion INTRAVENOUS 50 mcg/hr (09/19/12 [id number])  . propofol 50 mcg/kg/min (09/19/12 [id number])    Past Medical History  Diagnosis Date  . Renal insufficiency   . Diabetes mellitus   . Hypertension   . Gout   . Colon polyp 2003    Dr [name]; F/U was to be 2008( not completed)  . Diverticulosis 2003  . Pneumonia 09/29/11    Avelox X 10 daysas OP    Past Surgical History  Procedure Laterality  Date  . Refractive surgery  2005  . Colonoscopy w/ polypectomy  2003  . Wrist aspiration  02/16/12     monosodium urate crystals; Dr Gerri Spore RD, LDN Pager [phone] After Hours pager [phone]

## 2012-09-19 NOTE — Care Management Note (Addendum)
    Page 1 of 1   10/01/2012     4:06:43 PM   CARE MANAGEMENT NOTE 10/01/2012  Patient:  Corey [name], Corey [name]   Account Number:  [id number]  Date Initiated:  09/19/2012  Documentation initiated by:  Elissa Hefty  Subjective/Objective Assessment:   adm w abn ekg, vent     Action/Plan:   lives w wife, pcp dr Gwyndolyn Saxon hopper   Anticipated DC Date:     Anticipated DC Plan:        DC Planning Services  CM consult      PAC Choice  LONG TERM ACUTE CARE   Choice offered to / List presented to:             Status of service:   Medicare Important Message given?   (If response is "NO", the following Medicare IM given date fields will be blank) Date Medicare IM given:   Date Additional Medicare IM given:    Discharge Disposition:    Per UR Regulation:  Reviewed for med. necessity/level of care/duration of stay  If discussed at [place] of Stay Meetings, dates discussed:   09/26/2012  10/01/2012    Comments:  5/20 1604 debbie Liley Rake rn,bsn met w wife and explained ltac. she's more interested in [facility]. jenny from [facility] came and took wife for tour. pt and wife have 2 sons and both live local. wife does want [facility]. await ins final approval. [facility] does have [employer] contract but has to get prior auth for ltac. will cont to follow.  5/19 1128a debbie Iman Orourke rn,bsn trach today. will get ltac's to see if pt has benefits for ltac if needed.

## 2012-09-20 ENCOUNTER — Inpatient Hospital Stay (HOSPITAL_COMMUNITY): Payer: No Typology Code available for payment source

## 2012-09-20 LAB — BASIC METABOLIC PANEL
BUN: 37 mg/dL — ABNORMAL HIGH (ref 6–23)
GFR calc Af Amer: 21 mL/min — ABNORMAL LOW (ref >90)
GFR calc non Af Amer: 18 mL/min — ABNORMAL LOW (ref >90)
Potassium: 3.9 mEq/L (ref 3.5–5.1)
Sodium: 143 mEq/L (ref 135–145)

## 2012-09-20 LAB — BLOOD GAS, ARTERIAL
Bicarbonate: 21 mEq/L (ref 20.0–24.0)
Patient temperature: 98.6
TCO2: 22.1 mmol/L (ref 0–100)
pH, Arterial: 7.39 (ref 7.350–7.450)
pO2, Arterial: 70.9 mmHg — ABNORMAL LOW (ref 80.0–100.0)

## 2012-09-20 LAB — URINE CULTURE

## 2012-09-20 LAB — MAGNESIUM: Magnesium: 2 mg/dL (ref 1.5–2.5)

## 2012-09-20 LAB — PHOSPHORUS: Phosphorus: 2.7 mg/dL (ref 2.3–4.6)

## 2012-09-20 LAB — CBC
MCHC: 33.5 g/dL (ref 30.0–36.0)
RDW: 13.9 % (ref 11.5–15.5)

## 2012-09-20 MED ORDER — SODIUM CHLORIDE 0.9 % IV SOLN
INTRAVENOUS | Status: DC
  Administered 2012-09-20: 5 mL/h via INTRAVENOUS
  Administered 2012-09-21: 14:00:00 via INTRAVENOUS

## 2012-09-20 MED ORDER — ACETAMINOPHEN 160 MG/5ML PO SOLN
650.0000 mg | Freq: Four times a day (QID) | ORAL | Status: DC | PRN
  Administered 2012-09-20 – 2012-09-28 (×6): 650 mg
  Filled 2012-09-20 (×7): qty 20.3

## 2012-09-20 MED ORDER — ACETAMINOPHEN 325 MG PO TABS
650.0000 mg | ORAL_TABLET | Freq: Four times a day (QID) | ORAL | Status: DC | PRN
  Administered 2012-09-20: 650 mg via ORAL
  Filled 2012-09-20 (×2): qty 2

## 2012-09-20 MED ORDER — VANCOMYCIN HCL 10 G IV SOLR
1500.0000 mg | INTRAVENOUS | Status: DC
  Administered 2012-09-20 – 2012-09-22 (×2): 1500 mg via INTRAVENOUS
  Filled 2012-09-20 (×2): qty 1500

## 2012-09-20 MED ORDER — ACETAMINOPHEN 325 MG PO TABS: 650.0000 mg | ORAL_TABLET | Freq: Four times a day (QID) | ORAL | Status: DC | PRN

## 2012-09-20 MED ORDER — POTASSIUM CHLORIDE 20 MEQ/15ML (10%) PO LIQD
ORAL | Status: AC
  Administered 2012-09-20: 40 meq
  Filled 2012-09-20: qty 30

## 2012-09-20 MED ORDER — POTASSIUM CHLORIDE 20 MEQ/15ML (10%) PO LIQD
40.0000 meq | Freq: Once | ORAL | Status: AC
  Administered 2012-09-20: 40 meq
  Filled 2012-09-20: qty 30

## 2012-09-20 MED ORDER — NITROGLYCERIN IN D5W 200-5 MCG/ML-% IV SOLN: 2.0000 ug/min | INTRAVENOUS | Status: DC

## 2012-09-20 MED ORDER — FUROSEMIDE 10 MG/ML IJ SOLN
40.0000 mg | Freq: Three times a day (TID) | INTRAMUSCULAR | Status: DC
  Administered 2012-09-20 – 2012-09-21 (×3): 40 mg via INTRAVENOUS
  Filled 2012-09-20 (×4): qty 4

## 2012-09-20 MED ORDER — PIPERACILLIN-TAZOBACTAM 3.375 G IVPB
3.3750 g | Freq: Three times a day (TID) | INTRAVENOUS | Status: DC
  Administered 2012-09-20: 3.375 g via INTRAVENOUS
  Filled 2012-09-20 (×3): qty 50

## 2012-09-20 MED ORDER — PIPERACILLIN-TAZOBACTAM 3.375 G IVPB
3.3750 g | Freq: Three times a day (TID) | INTRAVENOUS | Status: DC
  Administered 2012-09-21: 3.375 g via INTRAVENOUS
  Filled 2012-09-20 (×4): qty 50

## 2012-09-20 MED ORDER — NITROGLYCERIN IN D5W 200-5 MCG/ML-% IV SOLN
INTRAVENOUS | Status: AC
  Administered 2012-09-20: 10 ug/min via INTRAVENOUS
  Filled 2012-09-20: qty 250

## 2012-09-20 NOTE — Progress Notes (Addendum)
Subjective:  Overnight, the patient's BP was significantly labile, dropping as low as the SBP 80's, despite only lasix and hydralazine.  The patient's CXR shows mildly improved pulmonary edema this morning after lasix, though he still has LE edema.  The patient's creatinine is elevated this morning, though he has good UOP of 1795.  Objective:  Vital Signs in the last 24 hours: Temp:  [97.6 F (36.4 C)-100.1 F (37.8 C)] 99 F (37.2 C) (05/09 0400) Pulse Rate:  [40-80] 64 (05/09 0500) Resp:  [0-25] 22 (05/09 0500) BP: (80-221)/(53-145) 120/71 mmHg (05/09 0500) SpO2:  [94 %-100 %] 96 % (05/09 0500) FiO2 (%):  [40 %-60 %] 40 % (05/09 0400) Weight:  [222 lb 14.2 oz (101.1 kg)] 222 lb 14.2 oz (101.1 kg) (05/09 0500)  Intake/Output from previous day: 05/08 0701 - 05/09 0700 In: 1009.4 [I.V.:1009.4] Out: 1945 [id number]; Emesis/NG output:150]  Physical Exam: General: lying in bed, intubated, sedated HEENT: pupils equal round and reactive to light, vision grossly intact, oropharynx clear and non-erythematous  Neck: supple, no lymphadenopathy, +JVD Lungs: Mechanical breath sounds, possible crackles at bilateral bases Heart: regular rate and rhythm, no murmurs, gallops, or rubs Abdomen: Soft, non-tender, non-distended with normoactive bowel sounds. No hepatomegaly. No rebound/guarding. No obvious abdominal masses. Extremities: 2+ pitting edema bilaterally Neurologic: sedated, intubated, occasional non-purposeful movements  Lab Results:  Recent Labs  09/19/12 0527 09/20/12 0400  WBC 4.4 6.4  HGB 11.6* 12.9*  PLT 188 153    Recent Labs  09/19/12 1635 09/20/12 0400  NA 143 143  K 3.6 3.9  CL 110 109  CO2 22 19  GLUCOSE 76 122*  BUN 33* 37*  CREATININE 2.31* 3.36*    Recent Labs  09/19/12 1302 09/19/12 1633  TROPONINI 0.31* <0.30    Cardiac Studies: Echocardiogram 09/19/12 - Left ventricle: The cavity size was normal. Wall thickness was increased in a pattern  of severe LVH. Systolic function was normal. The estimated ejection fraction was in the range of 55% to 60%. - Pericardium, extracardiac: A moderate, free-flowing pericardial effusion was identified circumferential to the heart. There was no evidence of hemodynamic compromise.  Tele: NSR, few PVC's  Assessment/Plan:  The patient is a 67 yo M, history of hypertrophic cardiomyopathy, presents after cardiopulmonary arrest with hypertensive emergency.   # Hypertensive emergency - the patient presents with initial BP 216/165, with altered mental status, pulmonary edema. The patient's wife admits to the patient's occasional medication non-compliance. The patient's BP decreased soon after admission just with sedation, then re-elevated, and has remained significantly labile overnight. -continue hydralazine 25 mg PO TID  -start lasix 60 mg IV q8hrs  -continue to hold amlodipine and coreg to avoid dropping BP too quickly.   # Acute Respiratory Failure/VDRF - The patient presented with acute respiratory failure, with evidence of pulmonary edema on CXR. The etiology for this event was likely pulmonary edema due to non-compliance with lasix, exacerbated by hypertensive emergency/diastolic dysfunction. Currently intubated, sedated. On propofol, fentanyl.  -management per CCM   # Acute on chronic CHF - echo from 2013 shows significant LVH, with grade 2 diastolic dysfunction, though with an EF of 55-60%. Patient has been non-compliant with BP meds. CXR shows evidence of pulmonary edema, and patient has bilateral pitting edema, consistent with volume overload. May have been worsened by hypertensive emergency.  -lasix per above  -holding coreg -patient currently not on ACE-I due to CKD   # Acute cardiac arrest/?PEA - EMS notes PEA after respiratory distress  and convulsion activity, which reverted to sinus rhythm after approx 1 minute of CPR. This process was likely driven by acute respiratory failure (see  above). Troponin only mildly elevated to 0.31.  # AKI on CKD - Likely due to ATN, due to labile BP's during admission.  Cr elevated to 3.36 this morning, increased from baseline around 1.8, though with good UOP of 1795. -follow I/O's and Cr  -if Cr continues to rise, pt may need nephrology consult  # Pericardial Effusion. Likely chronic, with interval increase in size from echo 02/2012. No evid for tamponade. ESR normal.  Elnora Morrison, M.D. 09/20/2012, 7:01 AM  Patient seen with resident, agree with the above note.  BP is up to 200s/100s with Propofol weaning.  1. Hypertensive emergency: BP somewhat labile here while on Propofol.  Now higher.   - Continue hydralazine - Will use NTG gtt for BP control as this can be rapidly titrated if BP drops too much.  When off Propofol, can adjust oral medications.  2. CHF: Acute on chronic diastolic CHF with pulmonary edema.  Creatinine up today, suspect ATN after PEA arrest.  He is still volume overloaded.  Would continue Lasix IV for now, will have to follow renal fxn and UOP.  3. Renal: AKI on CKD.  Concern for ATN with PEA arrest.  Will continue Lasix at current dose and follow response.  4. ID: Fever.  CXR without PNA.  Will panculture.   Loralie Champagne 09/20/2012 8:22 AM

## 2012-09-20 NOTE — Progress Notes (Signed)
MD paged about pt's persistent fever of 102.7 -103.1;

## 2012-09-20 NOTE — Progress Notes (Signed)
ANTIBIOTIC CONSULT NOTE - INITIAL  Pharmacy Consult for Vancomycin / Zosyn Indication: rule out sepsis  Allergies  Allergen Reactions  . Hydrochlorothiazide     Gout , uncontrolled diabetes and renal insufficiency  . Allopurinol     Renal insufficiency    Patient Measurements: Height: 5\' 6"  (167.6 cm) Weight: 222 lb 14.2 oz (101.1 kg) IBW/kg (Calculated) : 63.8  Labs:  Recent Labs  09/19/12 0333 09/19/12 0341 09/19/12 0527 09/19/12 0800 09/19/12 1635 09/20/12 0400  WBC 7.8  --  4.4  --   --  6.4  HGB 13.9 15.0 11.6*  --   --  12.9*  PLT 224  --  188  --   --  153  CREATININE 1.80* 1.70*  --  1.94* 2.31* 3.36*   Estimated Creatinine Clearance: 24.7 ml/min (by C-G formula based on Cr of 3.36). No results found for this basename: VANCOTROUGH, Corlis Leak, VANCORANDOM, [place], GENTPEAK, GENTRANDOM, TOBRATROUGH, TOBRAPEAK, TOBRARND, AMIKACINPEAK, AMIKACINTROU, AMIKACIN,  in the last 72 hours   Microbiology: Recent Results (from the past 720 hour(s))  MRSA PCR SCREENING     Status: None   Collection Time    09/19/12  6:19 AM      Result Value Range Status   MRSA by PCR NEGATIVE  NEGATIVE Final   Comment:            The GeneXpert MRSA Assay (FDA     approved for NASAL specimens     only), is one component of a     comprehensive MRSA colonization     surveillance program. It is not     intended to diagnose MRSA     infection nor to guide or     monitor treatment for     MRSA infections.    Medical History: Past Medical History  Diagnosis Date  . Renal insufficiency   . Diabetes mellitus   . Hypertension   . Gout   . Colon polyp 2003    Dr [name]; F/U was to be 2008( not completed)  . Diverticulosis 2003  . Pneumonia 09/29/11    [facility]ox X 10 daysas OP   Assessment: 67 year old male with CHF who present to the ED after her had sudden onset of respiratory failure at home.  He had a seizure and brief cardiac arrest.  He has been febrile for 2 days.   Beginning broad spectrum antibiotics.  Goal of Therapy:  Vancomycin trough level 15-20 mcg/ml Appropriate Zosyn dosing  Plan:  1) Vancomycin 1500 mg iv Q 48 hours 2) Zosyn 3.375 grams iv Q 8 hours - 4 hr infusion 3) Trough as indicated 4) Follow Scr, cultures, progress  Thank you. Anette Guarneri, PharmD [phone]  09/20/2012,12:50 PM

## 2012-09-20 NOTE — Progress Notes (Signed)
Patient febrile, with stable BP, WBC and clear CXR and U/A.  Febrile x2 days.  Pan cultures sent will start broad spectrum abx and limit as cultures result.  Rush Farmer, M.D. [facility]/Critical [facility] Medicine. Pager: [phone]. After hours pager: [phone].

## 2012-09-20 NOTE — Progress Notes (Signed)
PULMONARY  / CRITICAL CARE MEDICINE  Name: Corey [name] MRN: [id number] DOB: [birth year]    ADMISSION DATE:  09/19/2012 CONSULTATION DATE:  09/19/2012  REFERRING MD :  Christy Gentles PRIMARY SERVICE: PCCM  CHIEF COMPLAINT:  Acute respiratory failure  BRIEF PATIENT DESCRIPTION: 67 y/o male with LVH and diastolic dysfunction presented by EMS on 5/8 to the [facility] ED after he had the sudden onset of respiratory failure at home.  He had a seizure and brief cardiac arrest requiring <15min of CPR.    SIGNIFICANT EVENTS / STUDIES:  5/8 admission >>  LINES / TUBES: 5/8 ETT >> 5/8 peripheral IV >>  CULTURES: None  ANTIBIOTICS: None  SUBJECTIVE:   VITAL SIGNS: Temp:  [97.6 F (36.4 C)-103 F (39.4 C)] 103 F (39.4 C) (05/09 0947) Pulse Rate:  [40-99] 99 (05/09 0900) Resp:  [0-23] 22 (05/09 0900) BP: (80-255)/(53-145) 152/76 mmHg (05/09 0900) SpO2:  [94 %-100 %] 94 % (05/09 0900) FiO2 (%):  [40 %] 40 % (05/09 0809) Weight:  [101.1 kg (222 lb 14.2 oz)] 101.1 kg (222 lb 14.2 oz) (05/09 0500) HEMODYNAMICS:   VENTILATOR SETTINGS: Vent Mode:  [-] PRVC FiO2 (%):  [40 %] 40 % Set Rate:  [22 bmp] 22 bmp Vt Set:  [600 mL] 600 mL PEEP:  [5 cmH20] 5 cmH20 Plateau Pressure:  [13 cmH20-24 cmH20] 24 cmH20 INTAKE / OUTPUT: Intake/Output     05/08 0701 - 05/09 0700 05/09 0701 - 05/10 0700   I.V. (mL/kg) 1052.5 (10.4) 43.9 (0.4)   Total Intake(mL/kg) 1052.5 (10.4) 43.9 (0.4)   Urine (mL/kg/hr) 1795 (0.7) 450 (1.5)   Emesis/NG output 150 (0.1)    Other     Total Output 1945 450   Net -892.5 -406.1         PHYSICAL EXAMINATION: Gen: sedated on vent, intubated HEENT: NCAT, PERRL, EOMi, ETT in place PULM: Rhonchi B CV: RRR, distant heart sounds, no clear JVD AB: BS+, soft, nontender, no hsm Ext: warm, trace pretibial edema, no clubbing, no cyanosis Derm: no rash or skin breakdown Neuro: sedated on vent but arouses to voice, reaches for tube with both hands, withdraws to pain  LABS:  Recent  Labs Lab 09/19/12 0333 09/19/12 0341 09/19/12 0421 09/19/12 0432 09/19/12 0505 09/19/12 0527 09/19/12 0800 09/19/12 1302 09/19/12 1633 09/19/12 1635 09/20/12 0400 09/20/12 0415  HGB 13.9 15.0  --   --   --  11.6*  --   --   --   --  12.9*  --   WBC 7.8  --   --   --   --  4.4  --   --   --   --  6.4  --   PLT 224  --   --   --   --  188  --   --   --   --  153  --   NA 141 145  --   --   --   --   --   --   --  143 143  --   K 4.5 4.2  --   --   --   --   --   --   --  3.6 3.9  --   CL 106 110  --   --   --   --   --   --   --  110 109  --   CO2 22  --   --   --   --   --   --   --   --  22 19  --   GLUCOSE 377* 364*  --   --   --   --   --   --   --  76 122*  --   BUN 28* 31*  --   --   --   --   --   --   --  33* 37*  --   CREATININE 1.80* 1.70*  --   --   --   --  1.94*  --   --  2.31* 3.36*  --   CALCIUM 9.7  --   --   --   --   --   --   --   --  9.3 9.3  --   MG  --   --   --   --   --   --   --   --   --   --  2.0  --   PHOS  --   --   --   --   --   --   --   --   --   --  2.7  --   AST 146*  --   --   --   --   --   --   --   --   --   --   --   ALT 57*  --   --   --   --   --   --   --   --   --   --   --   ALKPHOS 81  --   --   --   --   --   --   --   --   --   --   --   BILITOT 0.4  --   --   --   --   --   --   --   --   --   --   --   PROT 7.8  --   --   --   --   --   --   --   --   --   --   --   ALBUMIN 3.7  --   --   --   --   --   --   --   --   --   --   --   APTT 27  --   --   --   --   --   --   --   --   --   --   --   INR 1.16  --   --   --   --   --   --   --   --   --   --   --   LATICACIDVEN  --   --   --   --   --   --  3.8*  --   --   --   --   --   TROPONINI <0.30  --   --   --   --  <0.30  --  0.31* <0.30  --   --   --   PROBNP  --   --   --  2818.0*  --   --   --   --   --   --   --   --   PHART  --   --  7.254*  --  7.331*  --   --   --   --   --   --  7.390  PCO2ART  --   --  54.8*  --  43.7  --   --   --   --   --   --  35.5  PO2ART  --   --   83.0  --  82.0  --   --   --   --   --   --  70.9*   Recent Labs Lab 09/19/12 1627 09/19/12 1950 09/19/12 2347 09/20/12 0406 09/20/12 0747  GLUCAP 72 81 109* 89 142*   5/8 CXR: Cardiomegally, ETT in place, pulm edema 5/8 EKG: Sinus tach, IVCD, ST depression inferior/lateral leads  ASSESSMENT / PLAN:  PULMONARY A: Acute respiratory failure due to pulmonary edema P:   - Begin PS trials but no extubation until hemodynamics are more improved.. - Daily CXR/ABG. - Daily WUA/SBT. - Decrease but continue diureses.  CARDIOVASCULAR A: Acute on chronic CHF presumably due to hypertensive emergency Hypertrophic, obstructive cardiomyopathy Hypertension, wife reports occasional medication non-compliance  P:  - Resume home BP meds. - Lasix x3 on 5/9 but decrease dosing given renal function. - Repeat echo on 5/8 considering large cardiac silhouette and known pericardial effusion - Tele monitoring. - NTG started for BP control, if ineffective will start nicardipine drip for BP control.   RENAL A:  CKD, at baseline, Cr worsening but no evidence of hypotension. P:   - Foley. - Monitor UOP. - Lasix as ordered, will decrease from yesterday's dose given renal function.. - K replacement. - If renal function continues to deteriorate will need renal to see. - Renal u/s ordered.  GASTROINTESTINAL A:  No acute issues P:   - PPI for stress ulcer prophylaxis - Continue TF.  HEMATOLOGIC A:  No acute issues P:  - Monitor CBC.  INFECTIOUS A:  No acute issues P:   - Monitor fever curve  ENDOCRINE A:  DM2, on 70/30 35 units qHS at home P:   - Glargine 20 units scheduled. - ICU hyperglycemia protocol.  NEUROLOGIC A:  Acute encephalopathy due to hypoxemia; improved mental status after intubation ?Seizure at home> currently mental status is OK, hypoxemia most likely explanation which has now resolved P:   - Monitor neuro status closely - Fentanyl/propofol for sedation titrated to  RASS -1, if unable to extubate by AM will need to change from propofol to versed on a PRN bases. - Hold further anti-epileptic medication unless clear evidence of seizure.  TODAY'S SUMMARY: Weaning well but needs better control of BP prior to seriously considering extubation.  I have personally obtained a history, examined the patient, evaluated laboratory and imaging results, formulated the assessment and plan and placed orders.  CRITICAL CARE: The patient is critically ill with multiple organ systems failure and requires high complexity decision making for assessment and support, frequent evaluation and titration of therapies, application of advanced monitoring technologies and extensive interpretation of multiple databases. Critical Care Time devoted to patient care services described in this note is 30 minutes.   YACOUB,WESAM,MD Pulmonary and [place] Pager: [phone]  09/20/2012, 9:57 AM

## 2012-09-21 ENCOUNTER — Inpatient Hospital Stay (HOSPITAL_COMMUNITY): Payer: No Typology Code available for payment source

## 2012-09-21 LAB — CBC
HCT: 32.1 % — ABNORMAL LOW (ref 39.0–52.0)
Hemoglobin: 10.7 g/dL — ABNORMAL LOW (ref 13.0–17.0)
MCH: 26.7 pg (ref 26.0–34.0)
MCHC: 33.3 g/dL (ref 30.0–36.0)
MCV: 80 fL (ref 78.0–100.0)
Platelets: 160 10*3/uL (ref 150–400)
RBC: 4.01 MIL/uL — ABNORMAL LOW (ref 4.22–5.81)
RDW: 14.4 % (ref 11.5–15.5)
WBC: 8 10*3/uL (ref 4.0–10.5)

## 2012-09-21 LAB — BASIC METABOLIC PANEL WITH GFR
BUN: 48 mg/dL — ABNORMAL HIGH (ref 6–23)
CO2: 20 meq/L (ref 19–32)
Calcium: 9.1 mg/dL (ref 8.4–10.5)
Chloride: 109 meq/L (ref 96–112)
Creatinine, Ser: 4.65 mg/dL — ABNORMAL HIGH (ref 0.50–1.35)
GFR calc Af Amer: 14 mL/min — ABNORMAL LOW
GFR calc non Af Amer: 12 mL/min — ABNORMAL LOW
Glucose, Bld: 113 mg/dL — ABNORMAL HIGH (ref 70–99)
Potassium: 4 meq/L (ref 3.5–5.1)
Sodium: 142 meq/L (ref 135–145)

## 2012-09-21 LAB — BLOOD GAS, ARTERIAL
Acid-base deficit: 3.5 mmol/L — ABNORMAL HIGH (ref 0.0–2.0)
Bicarbonate: 19.5 meq/L — ABNORMAL LOW (ref 20.0–24.0)
Drawn by: 31101
FIO2: 0.4 %
MECHVT: 600 mL
O2 Saturation: 90.2 %
PEEP: 5 cmH2O
Patient temperature: 98.6
RATE: 22 {breaths}/min
TCO2: 20.4 mmol/L (ref 0–100)
pCO2 arterial: 27 mmHg — ABNORMAL LOW (ref 35.0–45.0)
pH, Arterial: 7.473 — ABNORMAL HIGH (ref 7.350–7.450)
pO2, Arterial: 56 mmHg — ABNORMAL LOW (ref 80.0–100.0)

## 2012-09-21 LAB — MAGNESIUM: Magnesium: 2 mg/dL (ref 1.5–2.5)

## 2012-09-21 LAB — GLUCOSE, CAPILLARY
Glucose-Capillary: 105 mg/dL — ABNORMAL HIGH (ref 70–99)
Glucose-Capillary: 109 mg/dL — ABNORMAL HIGH (ref 70–99)
Glucose-Capillary: 125 mg/dL — ABNORMAL HIGH (ref 70–99)

## 2012-09-21 LAB — TRIGLYCERIDES: Triglycerides: 139 mg/dL (ref <150)

## 2012-09-21 LAB — CREATININE, URINE, RANDOM: Creatinine, Urine: 90.54 mg/dL

## 2012-09-21 LAB — PROCALCITONIN: Procalcitonin: 5.51 ng/mL

## 2012-09-21 LAB — PHOSPHORUS: Phosphorus: 3.4 mg/dL (ref 2.3–4.6)

## 2012-09-21 MED ORDER — PNEUMOCOCCAL VAC POLYVALENT 25 MCG/0.5ML IJ INJ
0.5000 mL | INJECTION | INTRAMUSCULAR | Status: AC | PRN
  Administered 2012-10-02: 0.5 mL via INTRAMUSCULAR

## 2012-09-21 MED ORDER — PIPERACILLIN-TAZOBACTAM IN DEX 2-0.25 GM/50ML IV SOLN
2.2500 g | Freq: Four times a day (QID) | INTRAVENOUS | Status: DC
  Administered 2012-09-21 – 2012-09-23 (×6): 2.25 g via INTRAVENOUS
  Filled 2012-09-21 (×9): qty 50

## 2012-09-21 MED ORDER — HEPARIN SODIUM (PORCINE) 5000 UNIT/ML IJ SOLN
5000.0000 [IU] | Freq: Three times a day (TID) | INTRAMUSCULAR | Status: DC
  Filled 2012-09-21 (×3): qty 1

## 2012-09-21 MED ORDER — HEPARIN SODIUM (PORCINE) 5000 UNIT/ML IJ SOLN
5000.0000 [IU] | Freq: Three times a day (TID) | INTRAMUSCULAR | Status: DC
  Administered 2012-09-21 – 2012-10-02 (×33): 5000 [IU] via SUBCUTANEOUS
  Filled 2012-09-21 (×35): qty 1

## 2012-09-21 NOTE — Procedures (Signed)
Arterial Catheter Insertion Procedure Note Corey [name] [id number] Nov 07, 1945  Procedure: Insertion of Arterial Catheter  Indications: Blood pressure monitoring  Procedure Details Consent: Risks of procedure as well as the alternatives and risks of each were explained to the (patient/caregiver).  Consent for procedure obtained. Time Out: Verified patient identification, verified procedure, site/side was marked, verified correct patient position, special equipment/implants available, medications/allergies/relevent history reviewed, required imaging and test results available.  Performed  Maximum sterile technique was used including antiseptics, cap, gloves, gown, hand hygiene and mask. Skin prep: Chlorhexidine; local anesthetic administered 20 gauge catheter was inserted into right radial artery using the Seldinger technique.  Evaluation Blood flow good; BP tracing good. Complications: No apparent complications.   BABCOCK,PETE 09/21/2012  Ultrasound used for site verification, live visualisation of needle entry & guidewire prior to dilation supervised by me  [facility] V.

## 2012-09-21 NOTE — Procedures (Signed)
Central Venous Catheter Insertion Procedure Note Corey [name] [id number] [birth year]  Procedure: Insertion of Central Venous Catheter Indications: Assessment of intravascular volume, Drug and/or fluid administration and Frequent blood sampling  Procedure Details Consent: Risks of procedure as well as the alternatives and risks of each were explained to the (patient/caregiver).  Consent for procedure obtained. Time Out: Verified patient identification, verified procedure, site/side was marked, verified correct patient position, special equipment/implants available, medications/allergies/relevent history reviewed, required imaging and test results available.  Performed Real time [place] used to ID and cannulate the vessel  Maximum sterile technique was used including antiseptics, cap, gloves, gown, hand hygiene, mask and sheet. Skin prep: Chlorhexidine; local anesthetic administered A antimicrobial bonded/coated triple lumen catheter was placed in the left internal jugular vein using the Seldinger technique.  Evaluation Blood flow good Complications: No apparent complications Patient did tolerate procedure well. Chest X-ray ordered to verify placement.  CXR: pending.   BABCOCK,PETE 09/21/2012, 2:08 PM  Ultrasound used for site verification, live visualisation of needle entry & guidewire prior to dilation Supervised by me  Jerry Clyne V.

## 2012-09-21 NOTE — Progress Notes (Signed)
ANTIBIOTIC CONSULT NOTE - FOLLOW UP  Pharmacy Consult for Vancomycin Corey [name] Indication: pneumonia  Allergies  Allergen Reactions  . Hydrochlorothiazide     Gout , uncontrolled diabetes and renal insufficiency  . Allopurinol     Renal insufficiency    Patient Measurements: Height: 5\' 6"  (167.6 cm) Weight: 221 lb 5.5 oz (100.4 kg) IBW/kg (Calculated) : 63.8   Vital Signs: Temp: 99.7 F (37.6 C) (05/10 1147) Temp src: Oral (05/10 1147) BP: 172/80 mmHg (05/10 1147) Pulse Rate: 80 (05/10 1147) Intake/Output from previous day: 05/09 0701 - 05/10 0700 In: 1570.5 [I.V.:970.5; IV Piggyback:600] Out: 2618 [Urine:2618] Intake/Output from this shift: Total I/O In: 202.4 [I.V.:172.4; NG/GT:30] Out: -   Labs:  Recent Labs  09/19/12 0527  09/19/12 1635 09/20/12 0400 09/21/12 0355  WBC 4.4  --   --  6.4 8.0  HGB 11.6*  --   --  12.9* 10.7*  PLT 188  --   --  153 160  CREATININE  --   < > 2.31* 3.36* 4.65*  < > = values in this interval not displayed. Estimated Creatinine Clearance: 17.8 ml/min (by C-G formula based on Cr of 4.65). No results found for this basename: VANCOTROUGH, Corlis Leak, VANCORANDOM, [place], GENTPEAK, GENTRANDOM, [place], TOBRAPEAK, TOBRARND, AMIKACINPEAK, AMIKACINTROU, AMIKACIN,  in the last 72 hours   Microbiology: Recent Results (from the past 720 hour(s))  URINE CULTURE     Status: None   Collection Time    09/19/12  4:26 AM      Result Value Range Status   Specimen Description URINE, RANDOM   Final   Special Requests NONE   Final   Culture  Setup Time 09/19/2012 09:16   Final   Colony Count NO GROWTH   Final   Culture NO GROWTH   Final   Report Status 09/20/2012 FINAL   Final  MRSA PCR SCREENING     Status: None   Collection Time    09/19/12  6:19 AM      Result Value Range Status   MRSA by PCR NEGATIVE  NEGATIVE Final   Comment:            The GeneXpert MRSA Assay (FDA     approved for NASAL specimens     only), is one component  of a     comprehensive MRSA colonization     surveillance program. It is not     intended to diagnose MRSA     infection nor to guide or     monitor treatment for     MRSA infections.    Anti-infectives   Start     Dose/Rate Route Frequency Ordered Stop   09/20/12 2100  piperacillin-tazobactam (ZOSYN) IVPB 3.375 g     3.375 g 12.5 mL/hr over 240 Minutes Intravenous Every 8 hours 09/20/12 2036     09/20/12 1400  piperacillin-tazobactam (ZOSYN) IVPB 3.375 g  Status:  Discontinued     3.375 g 12.5 mL/hr over 240 Minutes Intravenous 3 times per day 09/20/12 1249 09/20/12 2036   09/20/12 1400  vancomycin (VANCOCIN) 1,500 mg in sodium chloride 0.9 % 500 mL IVPB     1,500 mg 250 mL/hr over 120 Minutes Intravenous Every 48 hours 09/20/12 1249        Assessment: 67 year old male to continue on vancomycin and zosyn for suspected pneumonia. WBC 8.0 Tmax 103.2. Cultures pending. Will adjust zosyn dose in setting ARF scr 4.65 (increasing) est crcl 18 ml/min.   Goal of Therapy:  Vancomycin trough level 15-20 mcg/ml  Plan:  1) Decrease zosyn to 2.25 Gm IV q6h 2) Continue Vancomycin 1500 mg iv Q 48 hours 3) Monitor cx, renal function and clinical progression   Bola A. [place], [place] Pharmacist [phone] Phone [phone] 09/21/2012 12:20 PM

## 2012-09-21 NOTE — Progress Notes (Addendum)
PULMONARY  / CRITICAL CARE MEDICINE  Name: Corey [name] MRN: [id number] DOB: Jan 17, 1946    ADMISSION DATE:  09/19/2012 CONSULTATION DATE:  09/19/2012  REFERRING MD :  Christy Gentles PRIMARY SERVICE: PCCM  CHIEF COMPLAINT:  Acute respiratory failure  BRIEF PATIENT DESCRIPTION: 67 y/o male with LVH and diastolic dysfunction presented by EMS on 5/8 to the [facility] ED after he had the sudden onset of respiratory failure at home.  He had a seizure and brief cardiac arrest requiring <73min of CPR.    SIGNIFICANT EVENTS / STUDIES:  5/9: medical renal disease. No hydro  LINES / TUBES: 5/8 ETT >> 5/8 peripheral IV >> 5/10 LIJ >>  CULTURES: Blood 5/9>>> UC 5/9>>> Sputum 5/10>>>  ANTIBIOTICS: vanc 5/9>>> Zosyn 5/9 (fever)>>>  SUBJECTIVE:  Agitated at times. BP labile.  High grade fever VITAL SIGNS: Temp:  [100 F (37.8 C)-103.2 F (39.6 C)] 100 F (37.8 C) (05/10 0600) Pulse Rate:  [71-103] 77 (05/10 0749) Resp:  [19-23] 22 (05/10 0749) BP: (87-255)/(48-128) 174/81 mmHg (05/10 0749) SpO2:  [84 %-99 %] 93 % (05/10 0749) FiO2 (%):  [40 %-70 %] 70 % (05/10 0749) Weight:  [100.4 kg (221 lb 5.5 oz)] 100.4 kg (221 lb 5.5 oz) (05/10 0500) HEMODYNAMICS:   VENTILATOR SETTINGS: Vent Mode:  [-] PRVC FiO2 (%):  [40 %-70 %] 70 % Set Rate:  [22 bmp] 22 bmp Vt Set:  [600 mL] 600 mL PEEP:  [5 cmH20] 5 cmH20 Plateau Pressure:  [18 cmH20-23 cmH20] 23 cmH20 INTAKE / OUTPUT: Intake/Output     05/09 0701 - 05/10 0700 05/10 0701 - 05/11 0700   I.V. (mL/kg) 970.5 (9.7)    IV Piggyback 600    Total Intake(mL/kg) 1570.5 (15.6)    Urine (mL/kg/hr) 2618 (1.1)    Emesis/NG output     Total Output 2618     Net -1047.5           PHYSICAL EXAMINATION: Gen: sedated on vent, intubated HEENT: NCAT, PERRL, EOMi, ETT in place PULM: Rhonchi B, decreased left base  CV: RRR, distant heart sounds, no clear JVD AB: BS+, soft, nontender, no hsm Ext: warm, trace pretibial edema, no clubbing, no cyanosis Derm: no  rash or skin breakdown Neuro: sedated on vent but arouses to voice, reaches for tube with both hands, withdraws to pain LABS:  Recent Labs Lab 09/19/12 1635 09/20/12 0400 09/21/12 0355  NA 143 143 142  K 3.6 3.9 4.0  CL 110 109 109  CO2 22 19 20   BUN 33* 37* 48*  CREATININE 2.31* 3.36* 4.65*  GLUCOSE 76 122* 113*    Recent Labs Lab 09/19/12 0527 09/20/12 0400 09/21/12 0355  HGB 11.6* 12.9* 10.7*  HCT 34.3* 38.5* 32.1*  WBC 4.4 6.4 8.0  PLT 188 153 160   ABG    Component Value Date/Time   PHART 7.473* 09/21/2012 0300   PCO2ART 27.0* 09/21/2012 0300   PO2ART 56.0* 09/21/2012 0300   HCO3 19.5* 09/21/2012 0300   TCO2 20.4 09/21/2012 0300   ACIDBASEDEF 3.5* 09/21/2012 0300   O2SAT 90.2 09/21/2012 0300     Recent Labs Lab 09/20/12 1649 09/20/12 1947 09/20/12 2355 09/21/12 0405 09/21/12 0733  GLUCAP 114* 125* 105* 109* 125*   5/10 cxr: CM, LLL consol, prob atx, edema persists, low volume   ASSESSMENT / PLAN:  PULMONARY A: Acute respiratory failure due to pulmonary edema Now w/ LLL vol loss.  R>L airspace disease could also represent aspiration.  Possible mucous plugging. Requiring higher FIO2  P:   - increase PEEP to 8, wean FIO2 - obtain sputum culture  - Daily CXR/ABG. - Daily WUA/SBT.   CARDIOVASCULAR A: Acute on chronic CHF presumably due to hypertensive emergency Hypertrophic, obstructive cardiomyopathy Hypertension, wife reports occasional medication non-compliance, now hypotensive  PEA arrest (in setting of decomp HF) Cards following. He is now off NTG. BP labile.  P:  - Tele monitoring. - map goal >65 -dc hydralazine & lasix with low BP  RENAL A:  Acute on Chronic renal failure, CKD, at baseline, Cr worsening BP up and down , prob ATN P:   - Foley. - Monitor UOP. --need CVP, not sure what his vol status is  -may need renal involvement  -dc lasix  GASTROINTESTINAL A:  No acute issues P:   - PPI for stress ulcer prophylaxis - Continue  TF.  HEMATOLOGIC A:  No acute issues P:  - dc lovenox, use heparin with low cr clearance  INFECTIOUS A: fever, possible aspiration PNA P:   Cultures pending Narrow abx as able Trend PCT   ENDOCRINE A:  DM2, on 70/30 35 units qHS at home P:   - Glargine 20 units scheduled. - ICU hyperglycemia protocol.  NEUROLOGIC A:  Acute encephalopathy due to hypoxemia; improved mental status after intubation ?Seizure at home> currently mental status is OK, hypoxemia most likely explanation which has now resolved P:   - Monitor neuro status closely - Fentanyl/propofol gtt-->will cont for another 24 hrs, will consider transition to precedex after we get a better handle on his renal disease.  - get lipase and triglycerides today w/him on diprivan    Care during the described time interval was provided by me and/or other providers on the critical care team.  I have reviewed this patient's available data, including medical history, events of note, physical examination and test results as part of my evaluation  CC time x 38m  Kara Mead MD. FCCP. [place] [facility] Pager [phone] If no response call [phone]    09/21/2012, 8:28 AM

## 2012-09-21 NOTE — Progress Notes (Signed)
Patient ID: KENYUN LUMLEY, male   DOB: 24-Dec-1945, 67 y.o.   MRN: [id number] Subjective:  Remains intubated and sedated  Objective:  Vital Signs in the last 24 hours: Temp:  [100 F (37.8 C)-103.2 F (39.6 C)] 100 F (37.8 C) (05/10 0600) Pulse Rate:  [71-103] 77 (05/10 0749) Resp:  [19-23] 22 (05/10 0749) BP: (87-255)/(48-128) 174/81 mmHg (05/10 0749) SpO2:  [84 %-99 %] 93 % (05/10 0749) FiO2 (%):  [40 %-70 %] 70 % (05/10 0749) Weight:  [221 lb 5.5 oz (100.4 kg)] 221 lb 5.5 oz (100.4 kg) (05/10 0500)  Intake/Output from previous day: 05/09 0701 - 05/10 0700 In: 1570.5 [I.V.:970.5; IV Piggyback:600] Out: 2618 [Urine:2618] Intake/Output from this shift:    Physical Exam: Intubated and sedated HEENT: ET tube in place. Neck:  Unable to assess JVD, no thyromegally Back:  stable Lungs:  Scattered rales and rhonchi HEART:  Regular rate rhythm, no murmurs, no rubs, no clicks Abd:  soft, positive bowel sounds, no organomegally, no rebound, no guarding Ext:  2 plus pulses, no edema, no cyanosis, no clubbing Skin:  No rashes no nodules Neuro:  CN II through XII intact, motor grossly intact  Lab Results:  Recent Labs  09/20/12 0400 09/21/12 0355  WBC 6.4 8.0  HGB 12.9* 10.7*  PLT 153 160    Recent Labs  09/20/12 0400 09/21/12 0355  NA 143 142  K 3.9 4.0  CL 109 109  CO2 19 20  GLUCOSE 122* 113*  BUN 37* 48*  CREATININE 3.36* 4.65*    Recent Labs  09/19/12 1302 09/19/12 1633  TROPONINI 0.31* <0.30   Hepatic Function Panel  Recent Labs  09/19/12 0333  PROT 7.8  ALBUMIN 3.7  AST 146*  ALT 57*  ALKPHOS 81  BILITOT 0.4   No results found for this basename: CHOL,  in the last 72 hours No results found for this basename: PROTIME,  in the last 72 hours  Imaging: US Renal Port  09/20/2012  *RADIOLOGY REPORT*  Clinical Data: 67 year old male with worsening renal function.  RENAL/URINARY TRACT ULTRASOUND COMPLETE  Comparison:  10/03/2009 MRI  Findings:  Right  Kidney:  Mild diffuse increase echogenicity of the right kidney is noted, which measures 10.3 cm.  A 2.9 cm lower pole cyst is present.  There is no evidence of hydronephrosis, solid renal mass or definite renal calculi.  Left Kidney:  Mild diffuse increased echogenicity of the left kidney is noted, which measures 12.1 cm.  A 1.3 cm mid renal cyst is present.  There is no evidence of hydronephrosis, solid renal mass or definite renal calculi.  Bladder:  A Foley catheter is identified within the collapsed bladder.  IMPRESSION: Mild diffuse increased renal echogenicity bilaterally compatible with medical renal disease.  No evidence of hydronephrosis.   Original Report Authenticated By: Margarette Canada, M.D.    Dg Chest Port 1 View  09/20/2012  *RADIOLOGY REPORT*  Clinical Data: 67 year old male status post CPR.  Respiratory distress.  Intubated.  PORTABLE CHEST - 1 VIEW  Comparison: 09/19/2012 and earlier.  Findings: Portable semi upright AP view 0456 hours.  Endotracheal tube tip is in good position about halfway between clavicles and carina. Stable cardiomegaly and mediastinal contours.  Left lordotic view.  Mildly decreased lung volumes.  No pneumothorax. No large pleural effusion.  Continued retrocardiac hypoventilation. The pulmonary vascular congestion appears decreased. Enteric tube has been placed and persist to the abdomen, tip not included.  IMPRESSION:  1.  Stable endotracheal tube.  Enteric tube placed and courses to the abdomen, tip not included. 2.  Decreased pulmonary vascular congestion.  Stable cardiomegaly and retrocardiac hypoventilation.   Original Report Authenticated By: Roselyn Reef, M.D.     Cardiac Studies: Tele - nsrt Assessment/Plan:  1.VDRF - continue mechanical ventilation and supportive care as per CCM 2. Acute pulmonary edema due to acute diastolic CHF - he will continue IV lasix 3. Probable aspiration - continue Zosyn 4. PEA arrest likely due to #2. 5. Acute on chronic renal  failure - he is making urine. Note results of u/s. I suspect this represents ATN from PEA arrest.  6. Labile HTN - his blood pressure has been quite variable. Will add art line.  Gregg Taylor,M.D.  LOS: 2 days    Cristopher Peru 09/21/2012, 7:56 AM

## 2012-09-21 NOTE — Progress Notes (Signed)
09/21/2012 4:41 PM  Very Agitated. Jerking arms  Biting on ETT. Repostioned. Suctioned.  No change.  Family here.  Propofol at 20 mcg restarted.  Fent gtt remains at 150 mcg. Chattie Greeson, Carolynn Comment

## 2012-09-22 ENCOUNTER — Inpatient Hospital Stay (HOSPITAL_COMMUNITY): Payer: No Typology Code available for payment source

## 2012-09-22 DIAGNOSIS — J9819 Other pulmonary collapse: Secondary | ICD-10-CM

## 2012-09-22 DIAGNOSIS — J9811 Atelectasis: Secondary | ICD-10-CM | POA: Diagnosis not present

## 2012-09-22 LAB — COMPREHENSIVE METABOLIC PANEL
BUN: 60 mg/dL — ABNORMAL HIGH (ref 6–23)
Calcium: 9 mg/dL (ref 8.4–10.5)
GFR calc Af Amer: 13 mL/min — ABNORMAL LOW (ref >90)
Glucose, Bld: 113 mg/dL — ABNORMAL HIGH (ref 70–99)
Total Protein: 6.5 g/dL (ref 6.0–8.3)

## 2012-09-22 LAB — BLOOD GAS, ARTERIAL
Acid-base deficit: 4.6 mmol/L — ABNORMAL HIGH (ref 0.0–2.0)
FIO2: 0.5 %
MECHVT: 600 mL
Patient temperature: 98.6
TCO2: 20.6 mmol/L (ref 0–100)

## 2012-09-22 LAB — URIC ACID: Uric Acid, Serum: 8.9 mg/dL — ABNORMAL HIGH (ref 4.0–7.8)

## 2012-09-22 LAB — CBC
HCT: 32.3 % — ABNORMAL LOW (ref 39.0–52.0)
Hemoglobin: 10.9 g/dL — ABNORMAL LOW (ref 13.0–17.0)
MCH: 26.8 pg (ref 26.0–34.0)
MCHC: 33.7 g/dL (ref 30.0–36.0)

## 2012-09-22 LAB — GLUCOSE, CAPILLARY
Glucose-Capillary: 96 mg/dL (ref 70–99)
Glucose-Capillary: 83 mg/dL (ref 70–99)
Glucose-Capillary: 102 mg/dL — ABNORMAL HIGH (ref 70–99)

## 2012-09-22 LAB — TRIGLYCERIDES: Triglycerides: 123 mg/dL (ref <150)

## 2012-09-22 MED ORDER — SODIUM CHLORIDE 0.9 % IV SOLN: 250.0000 mL | INTRAVENOUS | Status: DC | PRN

## 2012-09-22 MED ORDER — METOPROLOL TARTRATE 1 MG/ML IV SOLN: 2.5000 mg | Freq: Four times a day (QID) | INTRAVENOUS | Status: DC

## 2012-09-22 MED ORDER — METOPROLOL TARTRATE 1 MG/ML IV SOLN
2.5000 mg | Freq: Four times a day (QID) | INTRAVENOUS | Status: DC
  Administered 2012-09-23: 13:00:00 via INTRAVENOUS
  Administered 2012-09-23 – 2012-09-29 (×20): 2.5 mg via INTRAVENOUS
  Filled 2012-09-22 (×28): qty 5

## 2012-09-22 MED ORDER — HYDRALAZINE HCL 25 MG PO TABS
25.0000 mg | ORAL_TABLET | Freq: Three times a day (TID) | ORAL | Status: DC
  Administered 2012-09-22: 25 mg via ORAL
  Filled 2012-09-22 (×2): qty 1

## 2012-09-22 MED ORDER — SODIUM CHLORIDE 0.9 % IV SOLN
INTRAVENOUS | Status: DC
  Administered 2012-09-22 – 2012-09-30 (×3): via INTRAVENOUS
  Administered 2012-09-30: 10 mL/h via INTRAVENOUS

## 2012-09-22 NOTE — Consult Note (Signed)
Corey [name] [facility] 09/22/2012 [place] Requesting Physician:  Dr Elsworth Soho  Reason for Consult:  Acute on CRF HPI: The patient is a 67 y.o. year-old with hx of DM, HTN, CKD with baseline creat of 1,5-1.9 (CKD IIIb) admitted on 5/8 with suuden onset of resp distress, pulm edema and uncontrolled HTN.  Had brief cardiac arrest with seizure and CPR less than 1 min. Since admission has had periods of frank hypotension with BP in the 80's alternating with periods of very high BP.  Renal function worsening with creat up to 4.91 today.  UOP is good, BP now is high. CXR showed worsening LLL ASD and today there was a white-out and pt was bronch'd.  Is on abx for PNA, BP stable last 24-48hrs and is not on any pressors. Has IV hydralazine ordered prn.  CVP is 12-17. Weight is unchanged from admit and total I/O is 5.2 in and 7.4L out since admission.   Pt on vent , sedated. UOP 75 cc/hr    ROS  not available   Past Medical History:  Past Medical History  Diagnosis Date  . Renal insufficiency   . Diabetes mellitus   . Hypertension   . Gout   . Colon polyp 2003    Dr Corey [name]; F/U was to be 2008( not completed)  . Diverticulosis 2003  . Pneumonia 09/29/11    Avelox X 10 daysas OP    Past Surgical History:  Past Surgical History  Procedure Laterality Date  . Refractive surgery  2005  . Colonoscopy w/ polypectomy  2003  . Wrist aspiration  02/16/12     monosodium urate crystals; Dr Corey [name]    Family History:  Family History  Problem Relation Age of Onset  . Hypertension Father   . Hypertension Mother   . Cancer Father     prostate  . Diabetes Mother   . Diabetes Father    Social History:  reports that he quit smoking about 44 years ago. He does not have any smokeless tobacco history on file. He reports that he drinks about 1.2 ounces of alcohol per week. He reports that he does not use illicit drugs.  Allergies:  Allergies  Allergen Reactions  . Hydrochlorothiazide     Gout ,  uncontrolled diabetes and renal insufficiency  . Allopurinol     Renal insufficiency    Home medications: Prior to Admission medications   Medication Sig Start Date End Date Taking? Authorizing Provider  amLODipine (NORVASC) 10 MG tablet Take 10 mg by mouth daily.   Yes Historical Provider, MD  carvedilol (COREG) 25 MG tablet Take 25 mg by mouth 2 (two) times daily with a meal.   Yes Historical Provider, MD  colchicine 0.6 MG tablet Because of kidney impairment; take one pill every Monday, Wednesday, Friday, and Sunday 02/19/12  Yes Hendricks Limes, MD  furosemide (LASIX) 40 MG tablet Take 40 mg by mouth daily. For swelling   Yes Historical Provider, MD  hydrALAZINE (APRESOLINE) 25 MG tablet Take 25 mg by mouth 3 (three) times daily.    Yes Historical Provider, MD  insulin lispro protamine-insulin lispro (HUMALOG MIX 75/25 KWIKPEN) (75-25) 100 UNIT/ML SUSP Inject 35 Units into the skin daily with supper.    Yes Historical Provider, MD    Labs: Basic Metabolic Panel:  Recent Labs Lab 09/19/12 0333 09/19/12 0341 09/19/12 0800 09/19/12 1635 09/20/12 0400 09/21/12 0355 09/22/12 0331  NA 141 145  --  143 143 142 144  K 4.5  4.2  --  3.6 3.9 4.0 4.0  CL 106 110  --  110 109 109 110  CO2 22  --   --  22 19 20 20   GLUCOSE 377* 364*  --  76 122* 113* 113*  BUN 28* 31*  --  33* 37* 48* 60*  CREATININE 1.80* 1.70* 1.94* 2.31* 3.36* 4.65* 4.91*  CALCIUM 9.7  --   --  9.3 9.3 9.1 9.0  PHOS  --   --   --   --  2.7 3.4  --    Liver Function Tests:  Recent Labs Lab 09/19/12 0333 09/22/12 0331  AST 146* 53*  ALT 57* 49  ALKPHOS 81 50  BILITOT 0.4 0.9  PROT 7.8 6.5  ALBUMIN 3.7 2.9*    Recent Labs Lab 09/21/12 1130  LIPASE 13   No results found for this basename: AMMONIA,  in the last 168 hours CBC:  Recent Labs Lab 09/19/12 0333  09/19/12 0527 09/20/12 0400 09/21/12 0355 09/22/12 0331  WBC 7.8  --  4.4 6.4 8.0 8.6  NEUTROABS 2.8  --   --   --   --   --   HGB 13.9  <  > 11.6* 12.9* 10.7* 10.9*  HCT 41.1  < > 34.3* 38.5* 32.1* 32.3*  MCV 79.5  --  79.2 79.7 80.0 79.6  PLT 224  --  188 153 160 143*  < > = values in this interval not displayed. PT/INR: @LABRCNTIP (inr:5) Cardiac Enzymes: ) Recent Labs Lab 09/19/12 0333 09/19/12 0527 09/19/12 1302 09/19/12 1633  TROPONINI <0.30 <0.30 0.31* <0.30   CBG:  Recent Labs Lab 09/21/12 1256 09/21/12 1603 09/21/12 2020 09/22/12 0015 09/22/12 0856  GLUCAP 94 110* 97 104* 96     Physical Exam:  Blood pressure 226/71, pulse 93, temperature 102.9 F (39.4 C), temperature source Oral, resp. rate 13, height 5\' 6"  (1.676 m), weight 100.3 kg (221 lb 1.9 oz), SpO2 94.00%.  Gen: on vent, sedated Skin: no rash, cyanosis HEENT:  EOMI, sclera anicteric, throat clear Neck: no JVD, no LAN Chest: clear on R, some rhonchi on L CV: regular, no rub or gallop, pedal pulses intact Abdomen: soft, nondistended, no ascites or HSM Ext: 1+pitting bilat pedal edema, mild pretibial edema, no joint effusion or deformity, no gangrene or ulceration Neuro: on vent, sedated  UA- >300 prot, 7-10 rbc UNa- 52 Renal US > 10.3/12.1 cm kidneys, inc'd echogenicity bilat, no hydro CXR- R clear , L side total white-out pre bronch today   Impression/Plan 1. Acute on CRF- suspect ATN from hypotension, AIN less likely possibility. No signs of pulm edema and may benefit from some gentle IVF"s. Severe HTN longstanding and prob has fixed small vessel disease in renal vasculature.  Recommend IVF's at low rate 60/hr for now, no need for acute dialysis at this time. Stop vanc. Will follow. Discussed with primary MD and family. 2. L PNA / fevers / intermittent hypotension- improving, on abx, s/p bronch today 3. HTN / severe LVH by echo, nl EF-  Avoid ACEI/ARB for now. Would continue prn IV meds for now until renal function clears. Will add scheduled IV metoprolol.  Avoid low BP's, keep SBP over 120-130 4. Pericardial effusion- moderate,  cardiology evaluating 5. CKD IIIb / proteinuria- check UPC ratio, may have diabetic nephropathy or other GN. Check complements and serologies  Will follow   Corey Splinter  MD [place] [phone] pgr     [phone] cell 09/22/2012, 12:55  PM       

## 2012-09-22 NOTE — Procedures (Signed)
Bronchoscopy Procedure Note HENRYK VANESS [id number] [birth year]  Procedure: Bronchoscopy Indications: Diagnostic evaluation of the airways and Obtain specimens for culture and/or other diagnostic studies  Procedure Details Consent: Risks of procedure as well as the alternatives and risks of each were explained to the (patient/caregiver).  Consent for procedure obtained. Time Out: Verified patient identification, verified procedure, site/side was marked, verified correct patient position, special equipment/implants available, medications/allergies/relevent history reviewed, required imaging and test results available.  Performed  In preparation for procedure, patient was given 100% FiO2 and bronchoscope lubricated. Sedation: propofol & fentnayl gtt  Airway entered and the following bronchi were examined: Bronchi.  Thick purulent secretions suctioned from left main stem bronchus Procedures performed: BAL performed Bronchoscope removed.  , Patient placed back on 100% FiO2 at conclusion of procedure.    Evaluation Hemodynamic Status: BP stable throughout; O2 sats: transiently fell during during procedure Patient's Current Condition: stable Specimens:  Sent purulent fluid for culture & cytology Complications: No apparent complications Patient did tolerate procedure well.   ALVA,RAKESH V. 09/22/2012

## 2012-09-22 NOTE — Progress Notes (Signed)
PULMONARY  / CRITICAL CARE MEDICINE  Name: Corey [name] MRN: [id number] DOB: 06/21/1945    ADMISSION DATE:  09/19/2012 CONSULTATION DATE:  09/19/2012  REFERRING MD :  Christy Gentles PRIMARY SERVICE: PCCM  CHIEF COMPLAINT:  Acute respiratory failure  BRIEF PATIENT DESCRIPTION: 67 y/o male with LVH and diastolic dysfunction presented by EMS on 5/8 to the [facility] ED after he had the sudden onset of respiratory failure at home.  He had a seizure and brief cardiac arrest requiring <49min of CPR.    SIGNIFICANT EVENTS / STUDIES:  5/9: medical renal disease. No hydro  LINES / TUBES: 5/8 ETT >> 5/8 peripheral IV >> 5/10 LIJ >>  CULTURES: Blood 5/9>>>ng UC 5/9>>>ng Sputum 5/10: gpc pairs/gnr>>>  ANTIBIOTICS: vanc 5/9>>> Zosyn 5/9 (fever)>>>  SUBJECTIVE:  Agitated at times. BP labile on 5/10, persistent high this am  High grade fever   VITAL SIGNS: Temp:  [98.5 F (36.9 C)-103 F (39.4 C)] 99.5 F (37.5 C) (05/11 0400) Pulse Rate:  [66-91] 82 (05/11 0747) Resp:  [9-24] 24 (05/11 0747) BP: (75-218)/(48-93) 218/93 mmHg (05/11 0311) SpO2:  [88 %-98 %] 95 % (05/11 0747) Arterial Line BP: (86-279)/(50-115) 116/62 mmHg (05/11 0700) FiO2 (%):  [50 %-70 %] 50 % (05/11 0747) Weight:  [100.3 kg (221 lb 1.9 oz)] 100.3 kg (221 lb 1.9 oz) (05/11 0500) HEMODYNAMICS: CVP:  [7 mmHg-15 mmHg] 13 mmHg VENTILATOR SETTINGS: Vent Mode:  [-] PRVC FiO2 (%):  [50 %-70 %] 50 % Set Rate:  [22 bmp] 22 bmp Vt Set:  [600 mL] 600 mL PEEP:  [8 cmH20-10 cmH20] 10 cmH20 Plateau Pressure:  [24 cmH20-39 cmH20] 39 cmH20 INTAKE / OUTPUT: Intake/Output     05/10 0701 - 05/11 0700 05/11 0701 - 05/12 0700   I.V. (mL/kg) 2010.1 (20)    NG/GT 75    IV Piggyback 100    Total Intake(mL/kg) 2185.1 (21.8)    Urine (mL/kg/hr) 1320 (0.5)    Emesis/NG output 250 (0.1)    Total Output 1570     Net +615.1           PHYSICAL EXAMINATION: Gen: sedated on vent, intubated HEENT: NCAT, PERRL, EOMi, ETT in place PULM: Rhonchi  B, more decreased left CV: RRR, distant heart sounds, no clear JVD AB: BS+, soft, nontender, no hsm Ext: warm, trace pretibial edema, no clubbing, no cyanosis Derm: no rash or skin breakdown Neuro: sedated on vent but arouses to voice, reaches for tube with both hands, withdraws to pain  LABS:  Recent Labs Lab 09/20/12 0400 09/21/12 0355 09/22/12 0331  NA 143 142 144  K 3.9 4.0 4.0  CL 109 109 110  CO2 19 20 20   BUN 37* 48* 60*  CREATININE 3.36* 4.65* 4.91*  GLUCOSE 122* 113* 113*    Recent Labs Lab 09/20/12 0400 09/21/12 0355 09/22/12 0331  HGB 12.9* 10.7* 10.9*  HCT 38.5* 32.1* 32.3*  WBC 6.4 8.0 8.6  PLT 153 160 143*   ABG    Component Value Date/Time   PHART 7.382 09/22/2012 0500   PCO2ART 33.6* 09/22/2012 0500   PO2ART 75.3* 09/22/2012 0500   HCO3 19.5* 09/22/2012 0500   TCO2 20.6 09/22/2012 0500   ACIDBASEDEF 4.6* 09/22/2012 0500   O2SAT 93.5 09/22/2012 0500  Triglycerides 123   Recent Labs Lab 09/19/12 0527 09/19/12 0800 09/20/12 0400 09/21/12 0355 09/21/12 1051 09/22/12 0331  PROCALCITON  --   --   --   --  5.51 5.58  WBC 4.4  --  6.4 8.0  --  8.6  LATICACIDVEN  --  3.8*  --   --   --   --     Recent Labs Lab 09/21/12 0733 09/21/12 1256 09/21/12 1603 09/21/12 2020 09/22/12 0015  GLUCAP 125* 94 110* 97 104*   5/11 cxr: CM, Lt hemiopacity with mediastinal shift c/w atx  ASSESSMENT / PLAN:  PULMONARY A: Acute respiratory failure due to pulmonary edema Now w/ LLL worse vol loss involving almost entire left thorax. Possible mucous plugging. Requiring higher FIO2 P:   - advance ETT, increase peep, f/u cxr--->proceed with FOB  - f/u sputum culture  - Daily CXR/ABG. - Daily WUA/SBT.   CARDIOVASCULAR A: Acute on chronic CHF presumably due to hypertensive emergency Pericardial effusion, moderate, no tamponade on echo 5/8 Hypertension, wife reports occasional medication non-compliance PEA arrest (in setting of decomp HF) Cards following. He  is now off NTG. BP labile.  P:  - Tele monitoring. - map goal >65 -resume hydralazine 25 tid & titrate   RENAL A:  Acute on Chronic renal failure, CKD, at baseline, Cr worsening BP up and down , prob ATN P:   - Foley. - Monitor UOP. -dc'd lasix -cards following for BP -will ask renal to see. Worried he will need HD  GASTROINTESTINAL A:  No acute issues P:   - PPI for stress ulcer prophylaxis - Continue TF.  HEMATOLOGIC A:  No acute issues P:  - dc lovenox, use heparin with low cr clearance  INFECTIOUS A: fever, possible aspiration PNA P:   Cultures pending-resend BAL cx Narrow abx as able Trend PCT   ENDOCRINE A:  DM2, on 70/30 35 units qHS at home P:   - Glargine 20 units scheduled. - ICU hyperglycemia protocol.  NEUROLOGIC A:  Acute encephalopathy due to hypoxemia; improved mental status after intubation ?Seizure at home> currently mental status is OK, hypoxemia most likely explanation which has now resolved P:   - Monitor neuro status closely - Fentanyl/propofol gtt-   Updated family at bedside  Care during the described time interval was provided by me and/or other providers on the critical care team.  I have reviewed this patient's available data, including medical history, events of note, physical examination and test results as part of my evaluation  CC time x  40 m   Kara Mead MD. Sh[place] Flood. [place] [facility] Pager [phone] If no response call [phone]   09/22/2012, 8:14 AM

## 2012-09-22 NOTE — Progress Notes (Signed)
Subjective:  The patient spiked a temperature of 103.0 overnight.  BP's remained labile overnight.  The patient's creatinine has risen only slightly from yesterday, with good UOP.  Arterial line placed yesterday for better BP monitoring.  Objective:  Vital Signs in the last 24 hours: Temp:  [98.5 F (36.9 C)-103 F (39.4 C)] 99.5 F (37.5 C) (05/11 0400) Pulse Rate:  [66-91] 71 (05/11 0700) Resp:  [9-22] 22 (05/11 0700) BP: (75-218)/(48-93) 218/93 mmHg (05/11 0311) SpO2:  [88 %-98 %] 94 % (05/11 0700) Arterial Line BP: (86-279)/(50-115) 116/62 mmHg (05/11 0700) FiO2 (%):  [50 %-70 %] 50 % (05/11 0400) Weight:  [221 lb 1.9 oz (100.3 kg)] 221 lb 1.9 oz (100.3 kg) (05/11 0500)  Intake/Output from previous day: 05/10 0701 - 05/11 0700 In: 2185.1 [I.V.:2010.1; NG/GT:75; IV Piggyback:100] Out: [id number] [Urine:1320; Emesis/NG output:250]  Physical Exam: General: lying in bed, intubated, sedated HEENT: pupils equal round and reactive to light, oropharynx clear and non-erythematous Neck: supple, no lymphadenopathy, +JVD Lungs: Mechanical breath sounds, crackles at bilateral bases Heart: regular rate and rhythm, no murmurs, gallops, or rubs Abdomen: Soft, non-tender, non-distended with normoactive bowel sounds. No hepatomegaly. No rebound/guarding. No obvious abdominal masses. Extremities: 2+ pitting edema bilaterally Neurologic: sedated, intubated, occasional non-purposeful movements  Lab Results:  Recent Labs  09/21/12 0355 09/22/12 0331  WBC 8.0 8.6  HGB 10.7* 10.9*  PLT 160 143*    Recent Labs  09/21/12 0355 09/22/12 0331  NA 142 144  K 4.0 4.0  CL 109 110  CO2 20 20  GLUCOSE 113* 113*  BUN 48* 60*  CREATININE 4.65* 4.91*    Recent Labs  09/19/12 1302 09/19/12 1633  TROPONINI 0.31* <0.30    Cardiac Studies: Echocardiogram 09/19/12 - Left ventricle: The cavity size was normal. Wall thickness was increased in a pattern of severe LVH. Systolic function was  normal. The estimated ejection fraction was in the range of 55% to 60%. - Pericardium, extracardiac: A moderate, free-flowing pericardial effusion was identified circumferential to the heart. There was no evidence of hemodynamic compromise.  Tele: NSR, few PVC's  Assessment/Plan:  The patient is a 67 yo M, history of hypertrophic cardiomyopathy, presents after cardiopulmonary arrest with hypertensive emergency.   # Hypertensive emergency - the patient presents with initial BP 216/165, with altered mental status, pulmonary edema. The patient's wife admits to the patient's occasional medication non-compliance. The patient's BP decreased soon after admission with sedation alone, then has remained labile since admission -continue lasix IV -hydralazine discontinued yesterday due to labile BP's -consider re-adding nitro drip  # Acute Respiratory Failure/VDRF - The patient presented with acute respiratory failure, with evidence of pulmonary edema on CXR. He now has near white out of leftt lung. The etiology for this event was likely pulmonary edema due to non-compliance with lasix, exacerbated by hypertensive emergency/diastolic dysfunction. Currently intubated, sedated. On propofol, fentanyl.  -management per CCM   # Acute on chronic Diastolic CHF - echo from 0000000 shows significant LVH, with grade 2 diastolic dysfunction, though with an EF of 55-60%. Patient has been non-compliant with BP meds. CXR shows evidence of pulmonary edema, and patient has bilateral pitting edema, consistent with volume overload. May have been worsened by hypertensive emergency.  -lasix per above  -holding coreg -patient currently not on ACE-I due to CKD   # PEA Arrest - EMS notes PEA after respiratory distress and convulsion activity, which reverted to sinus rhythm after approx 1 minute of CPR. This process was likely  driven by acute respiratory failure (see above). Troponin only mildly elevated to 0.31.  # AKI on CKD -  Likely due to ATN, due to labile BP's during admission.  Cr elevated to 4.9 this morning, increased from baseline around 1.8, though with good UOP of 1320. -follow I/O's and Cr  -if Cr continues to rise, consider nephrology consult  # Pericardial Effusion. Likely chronic, with interval increase in size from echo 02/2012. No evid for tamponade. ESR normal.  # Fever - patient febrile overnight, possibly representing PNA, concern for aspiration given patient's presentation. -management per CCM, on zosyn, vanc  Elnora Morrison, M.D. 09/22/2012, 7:17 AM  Cardiology Attending  Note fever, white out of leftt lung fields, and worsening oxygenation. I have never seen such labile blood pressure. Earlier this morning systolic blood pressure in the 280 range after suctioning, now in the 180's. Also worsening renal failure in the setting of increased total body edema. Agree with renal consult. I suspect HD is now likely. Does he need thoracentesis?  Mikle Bosworth.D.

## 2012-09-22 NOTE — Progress Notes (Signed)
[facility] Progress Note Patient Name: ROMANO GILPATRICK DOB: 1945/12/08 MRN: [id number]  Date of Service  09/22/2012   HPI/Events of Note   Blood pressure dropped her systolic to 90 systolic after one dose of 25 mg by mouth hydralazine   eICU Interventions   DC hydralazine  Restart hydralazine if blood pressure goes up again but at a lower dose  Temporally hold propofol blood pressure normalizes    Intervention Category Major Interventions: Hypotension - evaluation and management  Qasim Diveley 09/22/2012, 6:55 PM

## 2012-09-23 ENCOUNTER — Inpatient Hospital Stay (HOSPITAL_COMMUNITY): Payer: No Typology Code available for payment source

## 2012-09-23 DIAGNOSIS — D649 Anemia, unspecified: Secondary | ICD-10-CM

## 2012-09-23 LAB — PROCALCITONIN: Procalcitonin: 4.34 ng/mL

## 2012-09-23 LAB — C3 COMPLEMENT: C3 Complement: 157 mg/dL (ref 90–180)

## 2012-09-23 LAB — CBC
HCT: 29.5 % — ABNORMAL LOW (ref 39.0–52.0)
MCHC: 33.2 g/dL (ref 30.0–36.0)
MCV: 79.1 fL (ref 78.0–100.0)
RDW: 14.5 % (ref 11.5–15.5)

## 2012-09-23 LAB — GLUCOSE, CAPILLARY
Glucose-Capillary: 104 mg/dL — ABNORMAL HIGH (ref 70–99)
Glucose-Capillary: 108 mg/dL — ABNORMAL HIGH (ref 70–99)
Glucose-Capillary: 140 mg/dL — ABNORMAL HIGH (ref 70–99)
Glucose-Capillary: 85 mg/dL (ref 70–99)
Glucose-Capillary: 90 mg/dL (ref 70–99)
Glucose-Capillary: 91 mg/dL (ref 70–99)
Glucose-Capillary: 93 mg/dL (ref 70–99)

## 2012-09-23 LAB — COMPREHENSIVE METABOLIC PANEL
Albumin: 2.5 g/dL — ABNORMAL LOW (ref 3.5–5.2)
BUN: 67 mg/dL — ABNORMAL HIGH (ref 6–23)
Chloride: 113 mEq/L — ABNORMAL HIGH (ref 96–112)
Creatinine, Ser: 4.56 mg/dL — ABNORMAL HIGH (ref 0.50–1.35)
GFR calc Af Amer: 14 mL/min — ABNORMAL LOW (ref >90)
Glucose, Bld: 99 mg/dL (ref 70–99)
Total Bilirubin: 0.8 mg/dL (ref 0.3–1.2)
Total Protein: 6.1 g/dL (ref 6.0–8.3)

## 2012-09-23 LAB — HEPATITIS PANEL, ACUTE
HCV Ab: NEGATIVE
Hep A IgM: NEGATIVE

## 2012-09-23 LAB — PRO B NATRIURETIC PEPTIDE: Pro B Natriuretic peptide (BNP): 1211 pg/mL — ABNORMAL HIGH (ref 0–125)

## 2012-09-23 LAB — C4 COMPLEMENT: Complement C4, Body Fluid: 42 mg/dL — ABNORMAL HIGH (ref 10–40)

## 2012-09-23 MED ORDER — OSMOLITE 1.5 CAL PO LIQD
1000.0000 mL | ORAL | Status: DC
  Administered 2012-09-23 – 2012-10-01 (×8): 1000 mL
  Filled 2012-09-23 (×13): qty 1000

## 2012-09-23 MED ORDER — DEXTROSE 5 % IV SOLN
1.0000 g | INTRAVENOUS | Status: DC
  Administered 2012-09-23 – 2012-09-24 (×2): 1 g via INTRAVENOUS
  Filled 2012-09-23 (×3): qty 10

## 2012-09-23 MED ORDER — PRO-STAT SUGAR FREE PO LIQD
60.0000 mL | Freq: Three times a day (TID) | ORAL | Status: DC
  Administered 2012-09-23 – 2012-10-02 (×27): 60 mL
  Filled 2012-09-23 (×29): qty 60

## 2012-09-23 MED ORDER — NEPRO/CARBSTEADY PO LIQD
1000.0000 mL | ORAL | Status: DC
  Filled 2012-09-23 (×2): qty 1000

## 2012-09-23 MED ORDER — ACETYLCYSTEINE 20 % IN SOLN
4.0000 mL | Freq: Two times a day (BID) | RESPIRATORY_TRACT | Status: DC
  Administered 2012-09-23 – 2012-09-24 (×3): 4 mL via RESPIRATORY_TRACT
  Filled 2012-09-23 (×5): qty 4

## 2012-09-23 MED ORDER — ALBUTEROL SULFATE (5 MG/ML) 0.5% IN NEBU
2.5000 mg | INHALATION_SOLUTION | Freq: Two times a day (BID) | RESPIRATORY_TRACT | Status: AC
  Administered 2012-09-23 – 2012-09-24 (×3): 2.5 mg via RESPIRATORY_TRACT
  Filled 2012-09-23 (×3): qty 0.5

## 2012-09-23 NOTE — Progress Notes (Signed)
eLink Physician-Brief Progress Note Patient Name: Corey [name] DOB: [birth year] MRN: [id number]  Date of Service  09/23/2012   HPI/Events of Note   Bedside RN questioned ivf orders  eICU Interventions  Reduced all ivfs to kvo   Intervention Category Major Interventions: Respiratory failure - evaluation and management  Asencion Noble 09/23/2012, 9:06 PM

## 2012-09-23 NOTE — Progress Notes (Signed)
PULMONARY  / CRITICAL CARE MEDICINE  Name: Corey [name] MRN: [id number] DOB: 10/20/1945    ADMISSION DATE:  09/19/2012 CONSULTATION DATE:  09/19/2012  REFERRING MD :  Christy Gentles PRIMARY SERVICE: PCCM  CHIEF COMPLAINT:  Acute respiratory failure  BRIEF PATIENT DESCRIPTION: 67 y/o male with LVH and diastolic dysfunction presented by EMS on 5/8 to the [facility] ED after he had the sudden onset of respiratory failure at home.  He had a seizure and brief cardiac arrest requiring <46min of CPR.    SIGNIFICANT EVENTS / STUDIES:  5/8 CT head-peripheral and tentorial calcifications, increased since 2009 MRI 5/8 Echo- EF 55-60% 5/9: medical renal disease. No hydro 5/11 bronchoscopy-pending culture  LINES / TUBES: 5/8 ETT >> 5/8 peripheral IV >> 5/10 LIJ >> 5/10 Art Cath>>  CULTURES: Blood 5/9>>>ng UC 5/9>>>ng Sputum 5/10: NF  ANTIBIOTICS: vanc 5/9>>>5/12 Zosyn 5/9 (fever)>>>5/12 Ceftriaxone 5/12>>>  SUBJECTIVE:  Agitated at times. BP labile on 5/10, persistent high this am  High grade fever  VITAL SIGNS: Temp:  [99.7 F (37.6 C)-102.9 F (39.4 C)] 99.7 F (37.6 C) (05/12 0700) Pulse Rate:  [63-94] 66 (05/12 0823) Resp:  [10-23] 22 (05/12 0823) BP: (177-278)/(63-92) 246/92 mmHg (05/11 1710) SpO2:  [88 %-100 %] 88 % (05/12 0823) Arterial Line BP: (94-267)/(51-103) 148/64 mmHg (05/12 0700) FiO2 (%):  [50 %-60 %] 50 % (05/12 0823) Weight:  [223 lb 5.2 oz (101.3 kg)] 223 lb 5.2 oz (101.3 kg) (05/12 0500) HEMODYNAMICS: CVP:  [11 mmHg-15 mmHg] 12 mmHg VENTILATOR SETTINGS: Vent Mode:  [-] PRVC FiO2 (%):  [50 %-60 %] 50 % Set Rate:  [22 bmp] 22 bmp Vt Set:  [600 mL] 600 mL PEEP:  [10 cmH20] 10 cmH20 Plateau Pressure:  [24 cmH20-30 cmH20] 27 cmH20 INTAKE / OUTPUT: Intake/Output     05/11 0701 - 05/12 0700 05/12 0701 - 05/13 0700   I.V. (mL/kg) 1381.3 (13.6)    NG/GT 30    IV Piggyback 700    Total Intake(mL/kg) 2111.3 (20.8)    Urine (mL/kg/hr) 1050 (0.4)    Emesis/NG output      Total Output 1050     Net +1061.3           PHYSICAL EXAMINATION: Gen: sedated on vent, intubated HEENT: NCAT, PERRL, EOMi, ETT in place PULM: Rhonchi B, more decreased left CV: RRR, distant heart sounds, no clear JVD, no m/r/g AB: BS+, soft, nontender, no hsm Ext: warm, trace pretibial edema, no clubbing, no cyanosis Derm: no rash or skin breakdown Neuro: sedated on vent but arouses to voice, reaches for tube with both hands, withdraws to pain  LABS:  Recent Labs Lab 09/21/12 0355 09/22/12 0331 09/23/12 0435  NA 142 144 145  K 4.0 4.0 3.8  CL 109 110 113*  CO2 20 20 19   BUN 48* 60* 67*  CREATININE 4.65* 4.91* 4.56*  GLUCOSE 113* 113* 99    Recent Labs Lab 09/21/12 0355 09/22/12 0331 09/23/12 0435  HGB 10.7* 10.9* 9.8*  HCT 32.1* 32.3* 29.5*  WBC 8.0 8.6 7.1  PLT 160 143* 155   ABG    Component Value Date/Time   PHART 7.382 09/22/2012 0500   PCO2ART 33.6* 09/22/2012 0500   PO2ART 75.3* 09/22/2012 0500   HCO3 19.5* 09/22/2012 0500   TCO2 20.6 09/22/2012 0500   ACIDBASEDEF 4.6* 09/22/2012 0500   O2SAT 93.5 09/22/2012 0500  Triglycerides 123   Recent Labs Lab 09/19/12 0527 09/19/12 0800 09/20/12 0400 09/21/12 0355 09/21/12 1051 09/22/12 0331 09/23/12 0435  PROCALCITON  --   --   --   --  5.51 5.58 4.34  WBC 4.4  --  6.4 8.0  --  8.6 7.1  LATICACIDVEN  --  3.8*  --   --   --   --   --     Recent Labs Lab 09/22/12 1553 09/22/12 2046 09/23/12 0027 09/23/12 0319 09/23/12 0821  GLUCAP 100* 102* 104* 91 93   5/12- intr prom, big heart, ett wnl  ASSESSMENT / PLAN:  PULMONARY A: Acute respiratory failure due to pulmonary edema Now w/ LLL worse vol loss involving almost entire left thorax. Possible mucous plugging. Requiring higher FIO2 and bronch 5/11 P:   - keep at 10, remain x 24 hrs, 50% -add chest PT left -consider mucomyst's x 48 hrs -consider lasix to neg balance -pcxr in am   CARDIOVASCULAR A: Acute on chronic CHF presumably due to  hypertensive emergency Pericardial effusion, moderate, no tamponade on echo 5/8 Hypertension, wife reports occasional medication non-compliance PEA arrest (in setting of decomp HF) Cards following. He is now off NTG. BP labile.  P:  - Tele monitoring. - map goal >65 -held hydralazine -Echo 5/8- EF 55-60%  RENAL A:  Acute on Chronic renal failure, CKD, at baseline, Cr worsening BP up and down , prob ATN P:   - concern int edema, pos balance daily -likely to need lasix, will d/w renal -chem in am   GASTROINTESTINAL A:  No acute issues P:   - Protonix SUP - Continue TF  HEMATOLOGIC A: Anemia (due to CKD?), dilutiona as well P:  -trend CBC, hgb 9.8 5/12 -continue [place] heparin -will need lasix likley  INFECTIOUS A: fever, possible aspiration PNA P:   Culture neg, narrow to ceftriaxone follow bronch specimen  ENDOCRINE A:  DM2, on 70/30 35 units qHS at home P:   - Glargine 20 units scheduled. - ICU hyperglycemia protocol. - follow CBG  NEUROLOGIC A:  Acute encephalopathy due to hypoxemia; improved mental status after intubation ?Seizure at home> currently mental status is OK, hypoxemia most likely explanation which has now resolved P:   - Monitor neuro status closely - Fentanyl/propofol gtt- -WUA becomes agitated, opens eyes in response to name -CT re reviewed   [name], PA-S  Ccm time 35 min  I have fully examined this patient and agree with above findings.    And edite din fnull  [name]. [name], MD, [place] Pgr: [facility]

## 2012-09-23 NOTE — Progress Notes (Signed)
Subjective:  Febrile overnight- BP remains up and down- good UOP overnight, over a liter.  Creatinine decreased but BUN increased Objective Vital signs in last 24 hours: Filed Vitals:   09/23/12 0500 09/23/12 0600 09/23/12 0700 09/23/12 0823  BP:      Pulse: 65 63 64 66  Temp: 99.9 F (37.7 C) 99.7 F (37.6 C) 99.7 F (37.6 C)   TempSrc:      Resp: 22 22 22 22   Height:      Weight: 101.3 kg (223 lb 5.2 oz)     SpO2: 98% 98% 98% 88%   Weight change: 1 kg (2 lb 3.3 oz)  Intake/Output Summary (Last 24 hours) at 09/23/12 0915 Last data filed at 09/23/12 [id number]  Gross per 24 hour  Intake 1967.09 ml  Output    935 ml  Net 1032.09 ml   Labs: Basic Metabolic Panel:  Recent Labs Lab 09/19/12 1635 09/20/12 0400 09/21/12 0355 09/22/12 0331 09/23/12 0435  NA 143 143 142 144 145  K 3.6 3.9 4.0 4.0 3.8  CL 110 109 109 110 113*  CO2 22 19 20 20 19   GLUCOSE 76 122* 113* 113* 99  BUN 33* 37* 48* 60* 67*  CREATININE 2.31* 3.36* 4.65* 4.91* 4.56*  CALCIUM 9.3 9.3 9.1 9.0 9.1  PHOS  --  2.7 3.4  --   --    Liver Function Tests:  Recent Labs Lab 09/19/12 0333 09/22/12 0331 09/23/12 0435  AST 146* 53* 31  ALT 57* 49 35  ALKPHOS 81 50 53  BILITOT 0.4 0.9 0.8  PROT 7.8 6.5 6.1  ALBUMIN 3.7 2.9* 2.5*    Recent Labs Lab 09/21/12 1130  LIPASE 13   No results found for this basename: AMMONIA,  in the last 168 hours CBC:  Recent Labs Lab 09/19/12 0333  09/19/12 0527 09/20/12 0400 09/21/12 0355 09/22/12 0331 09/23/12 0435  WBC 7.8  --  4.4 6.4 8.0 8.6 7.1  NEUTROABS 2.8  --   --   --   --   --   --   HGB 13.9  < > 11.6* 12.9* 10.7* 10.9* 9.8*  HCT 41.1  < > 34.3* 38.5* 32.1* 32.3* 29.5*  MCV 79.5  --  79.2 79.7 80.0 79.6 79.1  PLT 224  --  188 153 160 143* 155  < > = values in this interval not displayed. Cardiac Enzymes:  Recent Labs Lab 09/19/12 0333 09/19/12 0527 09/19/12 1302 09/19/12 1633  TROPONINI <0.30 <0.30 0.31* <0.30   CBG:  Recent Labs Lab  09/22/12 1553 09/22/12 2046 09/23/12 0027 09/23/12 0319 09/23/12 0821  GLUCAP 100* 102* 104* 91 93    Iron Studies: No results found for this basename: IRON, TIBC, TRANSFERRIN, FERRITIN,  in the last 72 hours Studies/Results: Dg Chest Port 1 View  09/23/2012  *RADIOLOGY REPORT*  Clinical Data: 67 year old male cardiac arrest.  Acute pulmonary edema.  Intubated.  PORTABLE CHEST - 1 VIEW  Comparison: 09/22/2012 and earlier.  Findings: AP portable semi upright view 0556 hours.  Endotracheal tube tip in good position between the level of clavicles and carina.  Stable left IJ central line.  The patient is more rotated to the left.  No pneumothorax. Stable cardiomegaly and mediastinal contours.  Dense retrocardiac opacity persists obscuring the left hemidiaphragm.  Pulmonary vascular congestion and perihilar opacity appears mildly increased. Enteric tube courses to the abdomen, tip not included.  IMPRESSION: 1. Stable lines and tubes. 2.  More rotated to the left.  Pulmonary vascular congestion and right perihilar opacity appears increased.  Continued dense retrocardiac atelectasis / consolidation.   Original Report Authenticated By: Roselyn Reef, M.D.    Dg Chest Port 1 View  09/22/2012  *RADIOLOGY REPORT*  Clinical Data: Left-sided atelectasis.  Status post bronchoscopy.  PORTABLE CHEST - 1 VIEW  Comparison: Previous chest x-ray is 09/22/2012 at 09:37 a.m. and 05:43 a.m.  Findings: The patient remains intubated.  Left IJ line is stable. Heart is enlarged.  Aeration in the left upper lobe is improving. Persistent left lower lobe airspace disease is evident.  The lung volumes are low.  The right lung is clear.  IMPRESSION:  1.  Cardiomegaly with persistent left lower lobe airspace disease concerning for atelectasis or infection. 2.  Improved aeration in the left upper lobe. 3.  The support apparatus is stable. 4.  No focal airspace disease on the right.   Original Report Authenticated By: San Morelle, M.D.    Dg Chest Port 1 View  09/22/2012  *RADIOLOGY REPORT*  Clinical Data: Acute respiratory failure, CHF and fever.  PORTABLE CHEST - 1 VIEW  Comparison: 09/22/2012 and prior chest radiographs dating back to 09/28/2011  Findings: An endotracheal tube is again identified with tip 3.2 cm above the carina. A left IJ central venous catheters present with tip at the brachiocephalic- SVC junction. An NG tube is present entering the stomach with tip off the field of view. Complete opacification of the left hemithorax is now noted compatible with increasing pleural effusion/consolidation/atelectasis. The right lung is clear. There is no evidence of pneumothorax.  IMPRESSION: Increased left hemithorax opacification compatible with increasing pleural effusion/consolidation/atelectasis.  Support apparatus as described.   Original Report Authenticated By: Margarette Canada, M.D.    Dg Chest Port 1 View  09/22/2012  *RADIOLOGY REPORT*  Clinical Data: Respiratory failure and CHF.  PORTABLE CHEST - 1 VIEW  Comparison: 09/21/2012  Findings: Endotracheal tube remains with the tip approximately 4.5 cm above the carina.  There is further dense consolidation of the left lung with diminished aeration of the left upper lobe.  The left main stem bronchus is not as well visualized as on recent chest x-rays and it is felt that change in radiographic appearance most likely relates to progressive atelectasis of the left lung. Based on radiographic appearance, a component of pleural effusion cannot be excluded.  The right lung is clear.  Heart remains enlarged.  IMPRESSION: Progressive consolidation of the left lung.  Based on lack of distinct visualization of the left main stem bronchus compared to the prior chest x-rays, the progressive consolidation most likely represents progressive atelectasis at the left lung.   Original Report Authenticated By: Aletta Edouard, M.D.    Dg Chest Port 1 View  09/21/2012  *RADIOLOGY REPORT*   Clinical Data: Central line placement.  PORTABLE CHEST - 1 VIEW  Comparison: Film at 0653 hours  Findings: Interval placement of a left jugular central line with the catheter tip lying at the confluence of the brachiocephalic veins.  No pneumothorax identified.  Lungs show persistent consolidation of the left lower lung with potential associated left pleural fluid.  IMPRESSION: Central line tip lies at the brachiocephalic venous confluence.  No pneumothorax is identified.   Original Report Authenticated By: Aletta Edouard, M.D.    Medications: Infusions: . sodium chloride Stopped (09/22/12 1433)  . sodium chloride Stopped (09/22/12 1434)  . fentaNYL infusion INTRAVENOUS 50 mcg/hr (09/23/12 0907)  . propofol 20 mcg/kg/min (09/23/12 0744)    Scheduled  Medications: . antiseptic oral rinse  15 mL Mouth Rinse QID  . chlorhexidine  15 mL Mouth Rinse BID  . heparin subcutaneous  5,000 Units Subcutaneous Q8H  . insulin aspart  2-6 Units Subcutaneous Q4H  . insulin glargine  20 Units Subcutaneous QHS  . metoprolol  2.5 mg Intravenous Q6H  . pantoprazole (PROTONIX) IV  40 mg Intravenous QHS  . piperacillin-tazobactam (ZOSYN)  IV  2.25 g Intravenous Q6H  . vancomycin  1,500 mg Intravenous Q48H    have reviewed scheduled and prn medications.  Physical Exam: General: sedated but arousable on the vent Heart: RRR Lungs: CBS bilat Abdomen: soft non tender, non distended Extremities: minimal edema   Assessment/ Plan: Pt is a 67 y.o. yo male who was admitted on 09/19/2012 with respiratory failure/cardiac arrest- variable BP and probable pneumonia  Assessment/Plan: 1. A on CRF- documented baseline CKD with creatinine between 1.5 and 2- now with A on CRF in the setting of respiratory failure/ arrest/variable BP.  Nonoliguric on no lasix- no indications for HD- will continue to follow UOP, and labs 2. HTN/volume- does not appear too volume overloaded- variable BP but lately over the last 8 hours has been  reasonable- getting only PRNs for now- wonder if some of BP spikes has to do with agitation? Still with some lows so hesitant to put him on a longer acting agent.  3. Anemia- dropping slowly with hosp- will continue to follow 4. ID- per CCM-probable PNA with white out- s/p bronch-  zosyn/vanc 5. Vent management- per CCM- apparently not doing very well with weaning  Corey [name] A   09/23/2012,9:15 AM  LOS: 4 days

## 2012-09-23 NOTE — Progress Notes (Signed)
NUTRITION FOLLOW UP  Intervention:   1. D/c Nepro TF 2. Initiate Osmolite 1.5 @ 25 ml/hr via OG, this is the goal rate. 60 ml Prostat TID.  At goal rate, tube feeding regimen will provide 1500 kcal, 128 grams of protein, and 457 ml of H2O.   3. Propofol is providing additional 158 kcal from lipids daily.   Nutrition Dx:   Inadequate oral intake related to inability to eat as evidenced by NPO  Goal:   Enteral nutrition to provide 60-70% of estimated calorie needs (22-25 kcals/kg ideal body weight) and 100% of estimated protein needs, based on ASPEN guidelines for permissive underfeeding in critically ill obese individuals.   Monitor:   Vent status, weight trends, labs, TF tolerance  Assessment:   S/p bronch on 5/11. Renal now involved with worsening renal function. No need for HD at this time. Pt remains intubated, still requiring sedation. Mental status improved, arouses to voice, reaches for tube with both hands and withdraws to pain.   RD consulted for initiation and management of enteral nutrition.  Height: Ht Readings from Last 1 Encounters:  09/19/12 5\' 6"  (1.676 m)    Weight Status:   Wt Readings from Last 1 Encounters:  09/23/12 223 lb 5.2 oz (101.3 kg)   Patient is currently intubated on ventilator support.  MV: 13 Temp:Temp (24hrs), Avg:100.5 F (38.1 C), Min:99.7 F (37.6 C), Max:101.8 F (38.8 C)  Propofol: 6 ml/hr providing 158 kcal from lipids daily.   Re-estimated needs:  Kcal: 1749 Underfeeding goal: 1419-1612 kcal (based on 22-25 kcal/kg ideal body weight) Protein: >/= 129 gm  Fluid: >/= 1.4 L   Skin: intact   Diet Order: NPO   Intake/Output Summary (Last 24 hours) at 09/23/12 1227 Last data filed at 09/23/12 1100  Gross per 24 hour  Intake 2061.32 ml  Output    865 ml  Net 1196.32 ml    Last BM: none documented for this admission    Labs:   Recent Labs Lab 09/19/12 1635 09/20/12 0400 09/21/12 0355 09/22/12 0331 09/23/12 0435  NA  143 143 142 144 145  K 3.6 3.9 4.0 4.0 3.8  CL 110 109 109 110 113*  CO2 22 19 20 20 19   BUN 33* 37* 48* 60* 67*  CREATININE 2.31* 3.36* 4.65* 4.91* 4.56*  CALCIUM 9.3 9.3 9.1 9.0 9.1  MG  --  2.0 2.0  --   --   PHOS  --  2.7 3.4  --   --   GLUCOSE 76 122* 113* 113* 99    CBG (last 3)   Recent Labs  09/23/12 0027 09/23/12 0319 09/23/12 0821  GLUCAP 104* 91 93    Scheduled Meds: . acetylcysteine  4 mL Nebulization BID  . albuterol  2.5 mg Nebulization BID  . antiseptic oral rinse  15 mL Mouth Rinse QID  . cefTRIAXone (ROCEPHIN)  IV  1 g Intravenous Q24H  . chlorhexidine  15 mL Mouth Rinse BID  . feeding supplement (NEPRO CARB STEADY)  1,000 mL Per Tube Q24H  . heparin subcutaneous  5,000 Units Subcutaneous Q8H  . insulin aspart  2-6 Units Subcutaneous Q4H  . insulin glargine  20 Units Subcutaneous QHS  . metoprolol  2.5 mg Intravenous Q6H  . pantoprazole (PROTONIX) IV  40 mg Intravenous QHS    Continuous Infusions: . sodium chloride Stopped (09/22/12 1433)  . sodium chloride Stopped (09/22/12 1434)  . fentaNYL infusion INTRAVENOUS 50 mcg/hr (09/23/12 0907)  . propofol 9.93 mcg/kg/min (  09/23/12 1100)    Orson Slick RD, LDN Pager [phone] After Hours pager [phone]

## 2012-09-23 NOTE — Progress Notes (Signed)
Subjective:  The patient spiked a temperature of 102.9 overnight, similar to prior nights.  His BP's have continued to be labile overnight, ranging from systolic [id number].  The patient received bronchoscopy yesterday, which noted purulent secretions, culture pending.  The patient's creatinine has started to downtrend, still with good UOP.  Objective:  Vital Signs in the last 24 hours: Temp:  [99.7 F (37.6 C)-102.9 F (39.4 C)] 99.9 F (37.7 C) (05/12 0500) Pulse Rate:  [65-94] 65 (05/12 0500) Resp:  [10-24] 22 (05/12 0500) BP: (177-278)/(63-92) 246/92 mmHg (05/11 1710) SpO2:  [94 %-100 %] 98 % (05/12 0500) Arterial Line BP: (94-267)/(51-103) 144/64 mmHg (05/12 0500) FiO2 (%):  [50 %-60 %] 50 % (05/12 0325)  Intake/Output from previous day: 05/11 0701 - 05/12 0700 In: 1984.3 [I.V.:1304.3; NG/GT:30; IV Piggyback:650] Out: 940 [Urine:940]  Physical Exam: General: lying in bed, intubated, sedated HEENT: pupils equal round and reactive to light, oropharynx clear and non-erythematous Neck: supple, no lymphadenopathy, +JVD Lungs: Mechanical breath sounds, crackles at bilateral bases Heart: regular rate and rhythm, no murmurs, gallops, or rubs Abdomen: Soft, non-tender, non-distended with normoactive bowel sounds. No hepatomegaly. No rebound/guarding. No obvious abdominal masses. Extremities: 2+ pitting edema bilaterally Neurologic: sedated, intubated, opens eyes to voice  Lab Results:  Recent Labs  09/22/12 0331 09/23/12 0435  WBC 8.6 7.1  HGB 10.9* 9.8*  PLT 143* 155    Recent Labs  09/22/12 0331 09/23/12 0435  NA 144 145  K 4.0 3.8  CL 110 113*  CO2 20 19  GLUCOSE 113* 99  BUN 60* 67*  CREATININE 4.91* 4.56*   No results found for this basename: TROPONINI, CK, MB,  in the last 72 hours  Cardiac Studies: Echocardiogram 09/19/12 - Left ventricle: The cavity size was normal. Wall thickness was increased in a pattern of severe LVH. Systolic function was  normal. The estimated ejection fraction was in the range of 55% to 60%. - Pericardium, extracardiac: A moderate, free-flowing pericardial effusion was identified circumferential to the heart. There was no evidence of hemodynamic compromise.  Tele: NSR, few PVC's  Assessment/Plan:  The patient is a 67 yo M, history of hypertrophic cardiomyopathy, presents after PEA arrest with hypertensive emergency.   # Hypertensive emergency - the patient presents with initial BP 216/165, with altered mental status, pulmonary edema. The patient's BP decreased soon after admission with sedation alone, then has remained labile since admission despite only small doses of anti-hypertensives -hydralazine discontinued yesterday due to labile BP's -continue metoprolol with hold parameters  # Acute Respiratory Failure/VDRF - The patient presented with acute respiratory failure, with evidence of pulmonary edema on CXR. The etiology for this event was likely pulmonary edema due to non-compliance with lasix, exacerbated by hypertensive emergency/diastolic dysfunction.  He is s/p bronchoscopy 5/11, due to L lung opacity. Currently intubated, sedated. On propofol, fentanyl.  -management per CCM   # Acute on chronic Diastolic CHF - echo from 0000000 shows significant LVH, with grade 2 diastolic dysfunction, though with an EF of 55-60%. Patient has been non-compliant with BP meds. CXR shows evidence of pulmonary edema, and patient has bilateral pitting edema, consistent with volume overload. May have been worsened by hypertensive emergency.     Plan to let nephrology decide about timing of possible resumption of diuretics. -continue metoprolol with hold parameters -patient currently not on ACE-I due to CKD   # PEA Arrest - EMS notes PEA after respiratory distress and convulsion activity, which reverted to sinus rhythm after approx  1 minute of CPR. This process was likely driven by acute respiratory failure (see above).  Troponin only mildly elevated to 0.31.  # AKI on CKD - Likely due to ATN, due to labile BP's during admission.  Cr elevated to 4.56 this morning, which is slightly lower than yesterday, though still increased from baseline around 1.8, though with good UOP of 940). -follow I/O's and Cr  -per nephrology  # Pericardial Effusion. Likely chronic, with interval increase in size from echo 02/2012. No evid for tamponade. ESR normal.  # Fever - patient febrile overnight, possibly representing PNA, concern for aspiration given patient's presentation. -management per CCM, on zosyn, vanc  Elnora Morrison, M.D. 09/23/2012, 6:24 AM  Patient seen and examined. I agree with the assessment and plan as detailed above. See also my additional thoughts below.   I reviewed all data with Dr. Thurmond Butts. The patient continues to have variation in his blood pressures. It would seem that this may be most related to the process with his CNS. Meds are being used as indicated to help control his blood pressure. At this point we will leave any decisions about his diuretics to the nephrology team.  Dola Argyle, MD, [facility] 09/23/2012 8:39 AM

## 2012-09-24 ENCOUNTER — Inpatient Hospital Stay (HOSPITAL_COMMUNITY): Payer: No Typology Code available for payment source

## 2012-09-24 LAB — GLUCOSE, CAPILLARY
Glucose-Capillary: 160 mg/dL — ABNORMAL HIGH (ref 70–99)
Glucose-Capillary: 139 mg/dL — ABNORMAL HIGH (ref 70–99)

## 2012-09-24 LAB — CBC WITH DIFFERENTIAL/PLATELET
Basophils Absolute: 0 10*3/uL (ref 0.0–0.1)
Eosinophils Relative: 5 % (ref 0–5)
HCT: 31.1 % — ABNORMAL LOW (ref 39.0–52.0)
Lymphocytes Relative: 9 % — ABNORMAL LOW (ref 12–46)
Lymphs Abs: 0.6 10*3/uL — ABNORMAL LOW (ref 0.7–4.0)
MCV: 80.4 fL (ref 78.0–100.0)
Neutro Abs: 5 10*3/uL (ref 1.7–7.7)
Platelets: 174 10*3/uL (ref 150–400)
RBC: 3.87 MIL/uL — ABNORMAL LOW (ref 4.22–5.81)
RDW: 14.7 % (ref 11.5–15.5)
WBC: 6.9 10*3/uL (ref 4.0–10.5)

## 2012-09-24 LAB — UIFE/LIGHT CHAINS/TP QN, 24-HR UR
Free Kappa/Lambda Ratio: 6.04 ratio (ref 2.04–10.37)
Free Lambda Lt Chains,Ur: 7.32 mg/dL — ABNORMAL HIGH (ref 0.02–0.67)
Total Protein, Urine: 56.6 mg/dL

## 2012-09-24 LAB — COMPREHENSIVE METABOLIC PANEL
Albumin: 2.4 g/dL — ABNORMAL LOW (ref 3.5–5.2)
BUN: 70 mg/dL — ABNORMAL HIGH (ref 6–23)
Creatinine, Ser: 3.54 mg/dL — ABNORMAL HIGH (ref 0.50–1.35)
GFR calc Af Amer: 19 mL/min — ABNORMAL LOW (ref >90)
Glucose, Bld: 176 mg/dL — ABNORMAL HIGH (ref 70–99)
Total Bilirubin: 1.2 mg/dL (ref 0.3–1.2)
Total Protein: 6.7 g/dL (ref 6.0–8.3)

## 2012-09-24 LAB — CULTURE, RESPIRATORY W GRAM STAIN

## 2012-09-24 LAB — PROTEIN ELECTROPHORESIS, SERUM
Albumin ELP: 48 % — ABNORMAL LOW (ref 55.8–66.1)
Beta Globulin: 5.6 % (ref 4.7–7.2)
Total Protein ELP: 5.6 g/dL — ABNORMAL LOW (ref 6.0–8.3)

## 2012-09-24 LAB — RENAL FUNCTION PANEL
Albumin: 2.4 g/dL — ABNORMAL LOW (ref 3.5–5.2)
BUN: 69 mg/dL — ABNORMAL HIGH (ref 6–23)
Calcium: 9.3 mg/dL (ref 8.4–10.5)
Creatinine, Ser: 3.53 mg/dL — ABNORMAL HIGH (ref 0.50–1.35)
GFR calc non Af Amer: 17 mL/min — ABNORMAL LOW (ref >90)

## 2012-09-24 MED ORDER — FREE WATER
200.0000 mL | Freq: Three times a day (TID) | Status: DC
  Administered 2012-09-24 – 2012-09-25 (×4): 200 mL

## 2012-09-24 MED ORDER — METOPROLOL TARTRATE 25 MG/10 ML ORAL SUSPENSION
25.0000 mg | Freq: Two times a day (BID) | ORAL | Status: DC
  Administered 2012-09-24 – 2012-09-25 (×3): 25 mg via ORAL
  Filled 2012-09-24 (×6): qty 10

## 2012-09-24 MED ORDER — DEXTROSE 5 % IV SOLN
INTRAVENOUS | Status: DC
  Administered 2012-09-24 – 2012-09-26 (×3): via INTRAVENOUS

## 2012-09-24 MED ORDER — BISACODYL 10 MG RE SUPP
10.0000 mg | Freq: Once | RECTAL | Status: AC
  Administered 2012-09-24: 10 mg via RECTAL
  Filled 2012-09-24: qty 1

## 2012-09-24 NOTE — Progress Notes (Signed)
PULMONARY  / CRITICAL CARE MEDICINE  Name: Corey [name] MRN: [id number] DOB: Aug 31, 1945    ADMISSION DATE:  09/19/2012 CONSULTATION DATE:  09/19/2012  REFERRING MD :  Christy Gentles PRIMARY SERVICE: PCCM  CHIEF COMPLAINT:  Acute respiratory failure  BRIEF PATIENT DESCRIPTION: 67 y/o male with LVH and diastolic dysfunction presented by EMS on 5/8 to the [facility] ED after he had the sudden onset of respiratory failure at home.  He had a seizure and brief cardiac arrest requiring <1min of CPR.    SIGNIFICANT EVENTS / STUDIES:  5/8 CT head-peripheral and tentorial calcifications, increased since 2009 MRI 5/8 Echo- EF 55-60% 5/9: medical renal disease. No hydro 5/11 bronchoscopy-pending culture  LINES / TUBES: 5/8 ETT >> 5/8 peripheral IV >> 5/10 LIJ >> 5/10 Art Cath>>  CULTURES: Blood 5/9>>>ng UC 5/9>>>ng Sputum 5/10: NF  ANTIBIOTICS: vanc 5/9>>>5/12 Zosyn 5/9 (fever)>>>5/12 Ceftriaxone 5/12>>>  SUBJECTIVE:  Agitated at times, BP remains labile, following commands   VITAL SIGNS: Temp:  [99.7 F (37.6 C)-100.9 F (38.3 C)] 99.7 F (37.6 C) (05/13 0700) Pulse Rate:  [57-90] 59 (05/13 0733) Resp:  [5-25] 22 (05/13 0733) BP: (117-203)/(59-110) 118/75 mmHg (05/13 0733) SpO2:  [88 %-100 %] 99 % (05/13 0700) Arterial Line BP: (137-196)/(66-83) 196/83 mmHg (05/12 1200) FiO2 (%):  [40 %-50 %] 40 % (05/13 0733) Weight:  [225 lb 5 oz (102.2 kg)] 225 lb 5 oz (102.2 kg) (05/13 0500) HEMODYNAMICS: CVP:  [11 mmHg-12 mmHg] 12 mmHg VENTILATOR SETTINGS: Vent Mode:  [-] PRVC FiO2 (%):  [40 %-50 %] 40 % Set Rate:  [22 bmp] 22 bmp Vt Set:  [600 mL] 600 mL PEEP:  [10 cmH20] 10 cmH20 Plateau Pressure:  [15 cmH20-27 cmH20] 24 cmH20 INTAKE / OUTPUT: Intake/Output     05/12 0701 - 05/13 0700 05/13 0701 - 05/14 0700   I.V. (mL/kg) 1494.4 (14.6)    NG/GT     IV Piggyback 50    Total Intake(mL/kg) 1544.4 (15.1)    Urine (mL/kg/hr) 1490 (0.6)    Total Output 1490     Net +54.4            PHYSICAL EXAMINATION: Gen: sedated on vent, intubated, in NAD HEENT: NCAT, PERRL, EOMi, ETT in place PULM: Rhonchi B, more decreased left CV: RRR, distant heart sounds, no clear JVD, no m/r/g AB: BS+, soft, nontender, no hsm Ext: warm, trace pretibial edema, no clubbing, no cyanosis, MAEs Derm: no rash or skin breakdown Neuro: sedated on vent but arouses to voice, withdraws to pain, makes eye contact  LABS:  Recent Labs Lab 09/22/12 0331 09/23/12 0435 09/24/12 0500  NA 144 145 147*  148*  K 4.0 3.8 3.9  3.9  CL 110 113* 114*  114*  CO2 20 19 21  20   BUN 60* 67* 69*  70*  CREATININE 4.91* 4.56* 3.53*  3.54*  GLUCOSE 113* 99 175*  176*    Recent Labs Lab 09/22/12 0331 09/23/12 0435 09/24/12 0500  HGB 10.9* 9.8* 10.2*  HCT 32.3* 29.5* 31.1*  WBC 8.6 7.1 6.9  PLT 143* 155 174   ABG    Component Value Date/Time   PHART 7.382 09/22/2012 0500   PCO2ART 33.6* 09/22/2012 0500   PO2ART 75.3* 09/22/2012 0500   HCO3 19.5* 09/22/2012 0500   TCO2 20.6 09/22/2012 0500   ACIDBASEDEF 4.6* 09/22/2012 0500   O2SAT 93.5 09/22/2012 0500  Triglycerides 123   Recent Labs Lab 09/19/12 0527 09/19/12 0800  09/21/12 0355 09/21/12 1051 09/22/12 0331 09/23/12  [id number] 09/24/12 0500  PROCALCITON  --   --   --   --  5.51 5.58 4.34  --   WBC 4.4  --   < > 8.0  --  8.6 7.1 6.9  LATICACIDVEN  --  3.8*  --   --   --   --   --   --   < > = values in this interval not displayed.  Recent Labs Lab 09/23/12 1237 09/23/12 1649 09/23/12 1936 09/23/12 2348 09/24/12 0521  GLUCAP 90 85 108* 140* 160*   5/13- intr prom, big heart  ASSESSMENT / PLAN:  PULMONARY A: Acute respiratory failure due to pulmonary edema Now w/ LLL worse vol loss involving almost entire left thorax. Possible mucous plugging. Requiring higher FIO2 and bronch 5/11 P:   -mucomyst's x 24 hrs -consider lasix to neg balance -pcxr in am, much improved LLL after bronch -wean peep to 8 then 5 as goal, if successful  then to cpap5 ps 5 x 2 hrs -allow chest  Pt to dc  CARDIOVASCULAR A: Acute on chronic CHF presumably due to hypertensive emergency Pericardial effusion, moderate, no tamponade on echo 5/8 Hypertension, wife reports occasional medication non-compliance PEA arrest (in setting of decomp HF) Cards following. He is now off NTG. BP labile.  P:  - Tele monitoring. - map goal >65 -Echo 5/8- EF 55-60% -continue metoprolol, add oral  RENAL A:  Acute on Chronic renal failure, CKD, at baseline, Cr worsening BP up and down , prob ATN Hypernatremia P:   - concern int edema, pos balance daily -free water, per renal -trend BUN/Creatinine, lytes -add low d5w  GASTROINTESTINAL A:  No acute issues P:   - Protonix SUP - Continue TF -need a BM, if none, add dulx supp  HEMATOLOGIC A: Anemia (due to CKD?), dilutiona as well, increased hct P:  -continue [place] heparin  INFECTIOUS A: fever, possible aspiration PNA P:   Culture neg, continue ceftriaxone, add stop date soon, await to eval that LLL stays open follow bronch specimen  ENDOCRINE A:  DM2, on 70/30 35 units qHS at home P:   - Glargine 20 units scheduled, adding d5w - ICU hyperglycemia protocol. - follow CBG  NEUROLOGIC A:  Acute encephalopathy due to hypoxemia; improved mental status after intubation ?Seizure at home> currently mental status is OK, hypoxemia most likely explanation which has now resolved P:   - Fentanyl/propofol gtt- -WUA   [name], PA-S  Ccm time 30 min  I have fully examined this patient and agree with above findings.    And edite din fnull  [name]. [name], MD, [place] Pgr: [facility]

## 2012-09-24 NOTE — Progress Notes (Signed)
Subjective:  BP's have been somewhat more stable overnight compared to the day prior, but remain labile.  The patient only had a temperature of 100.9 yesterday, compared to 103 the day prior.  CXR this morning shows improved aeration of LLL, and bronchoscopy cultures of that area have been negative.  The patient's creatinine continues to downtrend, with good UOP.  Nursing reports the patient has been opening his eyes to his name and tracking movement well, though still with difficulty moving his limbs to commands.  Objective:  Vital Signs in the last 24 hours: Temp:  [99.7 F (37.6 C)-100.9 F (38.3 C)] 100 F (37.8 C) (05/13 0600) Pulse Rate:  [57-90] 66 (05/13 0600) Resp:  [5-25] 22 (05/13 0600) BP: (117-203)/(59-110) 117/77 mmHg (05/13 0600) SpO2:  [88 %-100 %] 98 % (05/13 0600) Arterial Line BP: (137-196)/(64-83) 196/83 mmHg (05/12 1200) FiO2 (%):  [40 %-50 %] 40 % (05/13 0436) Weight:  [225 lb 5 oz (102.2 kg)] 225 lb 5 oz (102.2 kg) (05/13 0500)  Intake/Output from previous day: 05/12 0701 - 05/13 0700 In: 1502.3 [I.V.:1452.3; IV Piggyback:50] Out: 1490 [Urine:1490]  Physical Exam: General: lying in bed, intubated, sedated HEENT: pupils equal round and reactive to light, oropharynx clear and non-erythematous Neck: supple, no lymphadenopathy Lungs: Mechanical breath sounds, scattered ronchi Heart: regular rate and rhythm, no murmurs, gallops, or rubs Abdomen: Soft, non-tender, non-distended with normoactive bowel sounds. No hepatomegaly. No rebound/guarding. No obvious abdominal masses. Extremities: 1+ pitting edema bilaterally Neurologic: sedated, intubated, opens eyes to voice, tracks movements  Lab Results:  Recent Labs  09/23/12 0435 09/24/12 0500  WBC 7.1 6.9  HGB 9.8* 10.2*  PLT 155 174    Recent Labs  09/23/12 0435 09/24/12 0500  NA 145 147*  148*  K 3.8 3.9  3.9  CL 113* 114*  114*  CO2 19 21  20   GLUCOSE 99 175*  176*  BUN 67* 69*  70*    CREATININE 4.56* 3.53*  3.54*   No results found for this basename: TROPONINI, CK, MB,  in the last 72 hours  Cardiac Studies: Echocardiogram 09/19/12 - Left ventricle: The cavity size was normal. Wall thickness was increased in a pattern of severe LVH. Systolic function was normal. The estimated ejection fraction was in the range of 55% to 60%. - Pericardium, extracardiac: A moderate, free-flowing pericardial effusion was identified circumferential to the heart. There was no evidence of hemodynamic compromise.  Tele: NSR  Assessment/Plan:  The patient is a 67 yo M, history of hypertrophic cardiomyopathy, presents after PEA arrest with hypertensive emergency.   # Hypertensive emergency - the patient presents with initial BP 216/165, with altered mental status, pulmonary edema. The patient's BP decreased soon after admission with sedation alone, then has remained labile since admission despite only small doses of anti-hypertensives.  Given PEA arrest, labile BP's may be a sign of central dysregulation, perhaps suggesting worse neurologic status than previously suspected. -continue metoprolol with hold parameters for SBP < 160  # Acute Respiratory Failure/VDRF - The patient presented with acute respiratory failure, with evidence of pulmonary edema on CXR. The etiology for this event was likely pulmonary edema due to non-compliance with lasix, exacerbated by hypertensive emergency/diastolic dysfunction.  He is s/p bronchoscopy 5/11, due to L lung opacity. Currently intubated, sedated. On propofol, fentanyl.  -management per CCM   # Acute on chronic Diastolic CHF - echo from 0000000 shows significant LVH, with grade 2 diastolic dysfunction, though with an EF of 55-60%.  Patient has been non-compliant with BP meds. CXR shows evidence of pulmonary edema, and patient has bilateral pitting edema, consistent with volume overload. May have been worsened by hypertensive emergency.   -will defer decision  about diuretics to nephrology -continue metoprolol with hold parameters -patient currently not on ACE-I due to CKD   # PEA Arrest - EMS notes PEA after respiratory distress and convulsion activity, which reverted to sinus rhythm after approx 1 minute of CPR. This process was likely driven by acute respiratory failure (see above). Troponin only mildly elevated to 0.31.  # AKI on CKD - Likely due to ATN, due to labile BP's during admission.  Cr elevated to 4.56 this morning, which is slightly lower than yesterday, though still increased from baseline around 1.8, though with good UOP of 940). -follow I/O's and Cr  -per nephrology  # Pericardial Effusion. Likely chronic, with interval increase in size from echo 02/2012. No evid for tamponade. ESR normal.  # Fever - patient febrile overnight, possibly representing PNA, concern for aspiration given patient's presentation. -management per CCM, vanc/zosyn changed to ceftriaxone yesterday  # Anemia - hemoglobin slowly downtrending this admission.  No evidence of active bleeding. -continue to follow CBC's  Elnora Morrison, M.D. 09/24/2012, 6:21 AM   History and all data above reviewed.  Patient examined.  I agree with the findings as above.  He is intubated but responding.  BP goes up when awake. The patient exam reveals COR:RRR  ,  Lungs: Decreased BS  ,  Abd: Positive bowel sounds, no rebound no guarding, Ext Trace diffuse edema  .  All available labs, radiology testing, previous records reviewed. Agree with documented assessment and plan. Agree with current plans for PRN dosing of the beta blocker.  BP is labile.  Evidence does not suggest that pulmonary edema is the leading culprit with continued needs for vent support.  Holding diuretic.   Jeneen Rinks Shauntay Brunelli  9:22 AM  09/24/2012

## 2012-09-24 NOTE — Progress Notes (Signed)
Subjective:  Febrile overnight but fever curve improving.  BP remains up and down- good UOP overnight, over a liter.  Creatinine decreased/BUN stable Objective Vital signs in last 24 hours: Filed Vitals:   09/24/12 0506 09/24/12 0600 09/24/12 0700 09/24/12 0733  BP: 145/82 117/77 118/75 118/75  Pulse: 75 66 62 59  Temp: 100.6 F (38.1 C) 100 F (37.8 C) 99.7 F (37.6 C)   TempSrc:      Resp: 22 22 22 22   Height:      Weight:      SpO2: 98% 98% 99%    Weight change: 0.9 kg (1 lb 15.7 oz)  Intake/Output Summary (Last 24 hours) at 09/24/12 0803 Last data filed at 09/24/12 0700  Gross per 24 hour  Intake 1479.4 ml  Output   1415 ml  Net   64.4 ml   Labs: Basic Metabolic Panel:  Recent Labs Lab 09/20/12 0400 09/21/12 0355 09/22/12 0331 09/23/12 0435 09/24/12 0500  NA 143 142 144 145 147*  148*  K 3.9 4.0 4.0 3.8 3.9  3.9  CL 109 109 110 113* 114*  114*  CO2 19 20 20 19 21  20   GLUCOSE 122* 113* 113* 99 175*  176*  BUN 37* 48* 60* 67* 69*  70*  CREATININE 3.36* 4.65* 4.91* 4.56* 3.53*  3.54*  CALCIUM 9.3 9.1 9.0 9.1 9.3  9.6  PHOS 2.7 3.4  --   --  4.9*   Liver Function Tests:  Recent Labs Lab 09/22/12 0331 09/23/12 0435 09/24/12 0500  AST 53* 31 95*  ALT 49 35 59*  ALKPHOS 50 53 95  BILITOT 0.9 0.8 1.2  PROT 6.5 6.1 6.7  ALBUMIN 2.9* 2.5* 2.4*  2.4*    Recent Labs Lab 09/21/12 1130  LIPASE 13   No results found for this basename: AMMONIA,  in the last 168 hours CBC:  Recent Labs Lab 09/19/12 0333  09/20/12 0400 09/21/12 0355 09/22/12 0331 09/23/12 0435 09/24/12 0500  WBC 7.8  < > 6.4 8.0 8.6 7.1 6.9  NEUTROABS 2.8  --   --   --   --   --  5.0  HGB 13.9  < > 12.9* 10.7* 10.9* 9.8* 10.2*  HCT 41.1  < > 38.5* 32.1* 32.3* 29.5* 31.1*  MCV 79.5  < > 79.7 80.0 79.6 79.1 80.4  PLT 224  < > 153 160 143* 155 174  < > = values in this interval not displayed. Cardiac Enzymes:  Recent Labs Lab 09/19/12 0333 09/19/12 0527 09/19/12 1302  09/19/12 1633  TROPONINI <0.30 <0.30 0.31* <0.30   CBG:  Recent Labs Lab 09/23/12 1237 09/23/12 1649 09/23/12 1936 09/23/12 2348 09/24/12 0521  GLUCAP 90 85 108* 140* 160*    Iron Studies: No results found for this basename: IRON, TIBC, TRANSFERRIN, FERRITIN,  in the last 72 hours Studies/Results: Dg Chest Port 1 View  09/24/2012  *RADIOLOGY REPORT*  Clinical Data: Intubated patient.  PORTABLE CHEST - 1 VIEW  Comparison: Plain film chest 09/23/2012.  Findings: Support tubes and lines are unchanged.  Marked enlargement of the cardiopericardial silhouette is identified. Left worse than right basilar airspace disease persists but has improved.  No pneumothorax identified.  IMPRESSION:  1.  Improved left worse than right basilar airspace disease. 2.  Marked enlargement of the cardiopericardial silhouette consistent with cardiomegaly and/or pericardial effusion.   Original Report Authenticated By: Orlean Patten, M.D.    Dg Chest Port 1 View  09/23/2012  *RADIOLOGY REPORT*  Clinical Data: 67 year old male cardiac arrest.  Acute pulmonary edema.  Intubated.  PORTABLE CHEST - 1 VIEW  Comparison: 09/22/2012 and earlier.  Findings: AP portable semi upright view 0556 hours.  Endotracheal tube tip in good position between the level of clavicles and carina.  Stable left IJ central line.  The patient is more rotated to the left.  No pneumothorax. Stable cardiomegaly and mediastinal contours.  Dense retrocardiac opacity persists obscuring the left hemidiaphragm.  Pulmonary vascular congestion and perihilar opacity appears mildly increased. Enteric tube courses to the abdomen, tip not included.  IMPRESSION: 1. Stable lines and tubes. 2.  More rotated to the left.  Pulmonary vascular congestion and right perihilar opacity appears increased.  Continued dense retrocardiac atelectasis / consolidation.   Original Report Authenticated By: Roselyn Reef, M.D.    Dg Chest Port 1 View  09/22/2012  *RADIOLOGY  REPORT*  Clinical Data: Left-sided atelectasis.  Status post bronchoscopy.  PORTABLE CHEST - 1 VIEW  Comparison: Previous chest x-ray is 09/22/2012 at 09:37 a.m. and 05:43 a.m.  Findings: The patient remains intubated.  Left IJ line is stable. Heart is enlarged.  Aeration in the left upper lobe is improving. Persistent left lower lobe airspace disease is evident.  The lung volumes are low.  The right lung is clear.  IMPRESSION:  1.  Cardiomegaly with persistent left lower lobe airspace disease concerning for atelectasis or infection. 2.  Improved aeration in the left upper lobe. 3.  The support apparatus is stable. 4.  No focal airspace disease on the right.   Original Report Authenticated By: San Morelle, M.D.    Dg Chest Port 1 View  09/22/2012  *RADIOLOGY REPORT*  Clinical Data: Acute respiratory failure, CHF and fever.  PORTABLE CHEST - 1 VIEW  Comparison: 09/22/2012 and prior chest radiographs dating back to 09/28/2011  Findings: An endotracheal tube is again identified with tip 3.2 cm above the carina. A left IJ central venous catheters present with tip at the brachiocephalic- SVC junction. An NG tube is present entering the stomach with tip off the field of view. Complete opacification of the left hemithorax is now noted compatible with increasing pleural effusion/consolidation/atelectasis. The right lung is clear. There is no evidence of pneumothorax.  IMPRESSION: Increased left hemithorax opacification compatible with increasing pleural effusion/consolidation/atelectasis.  Support apparatus as described.   Original Report Authenticated By: Margarette Canada, M.D.    Medications: Infusions: . sodium chloride Stopped (09/22/12 1433)  . sodium chloride 20 mL/hr at 09/23/12 2105  . fentaNYL infusion INTRAVENOUS 100 mcg/hr (09/24/12 0300)  . propofol 20 mcg/kg/min (09/24/12 0500)    Scheduled Medications: . acetylcysteine  4 mL Nebulization BID  . antiseptic oral rinse  15 mL Mouth Rinse QID  .  cefTRIAXone (ROCEPHIN)  IV  1 g Intravenous Q24H  . chlorhexidine  15 mL Mouth Rinse BID  . feeding supplement (OSMOLITE 1.5 CAL)  1,000 mL Per Tube Q24H  . feeding supplement  60 mL Per Tube TID  . heparin subcutaneous  5,000 Units Subcutaneous Q8H  . insulin aspart  2-6 Units Subcutaneous Q4H  . insulin glargine  20 Units Subcutaneous QHS  . metoprolol  2.5 mg Intravenous Q6H  . pantoprazole (PROTONIX) IV  40 mg Intravenous QHS    have reviewed scheduled and prn medications.  Physical Exam: General: sedated but arousable on the vent Heart: RRR Lungs: CBS bilat Abdomen: soft non tender, non distended Extremities: minimal edema   Assessment/ Plan: Pt is a 67 y.o. yo  male who was admitted on 09/19/2012 with respiratory failure/cardiac arrest- variable BP and probable pneumonia  Assessment/Plan: 1. A on CRF- documented baseline CKD with creatinine between 1.5 and 2- now with A on CRF in the setting of respiratory failure/ arrest/variable BP.  Nonoliguric on no lasix- no indications for HD- will continue to follow UOP, and labs.  Hopefully improving 2. HTN/volume- does not appear too volume overloaded- variable BP but lately over the last 8 hours has been reasonable- getting only PRNs for now- wonder if some of BP spikes has to do with agitation? Still with some lows so hesitant to put him on a longer acting agent.  With hypernatremia will add some free water to tube 3. Anemia- dropping slowly with hosp- will continue to follow 4. ID- per CCM-probable PNA with white out- s/p bronch-  rocephin 5. Vent management- per CCM- apparently not doing very well with weaning   Kamrin Spath A   09/24/2012,8:03 AM  LOS: 5 days

## 2012-09-25 ENCOUNTER — Inpatient Hospital Stay (HOSPITAL_COMMUNITY): Payer: No Typology Code available for payment source

## 2012-09-25 LAB — RENAL FUNCTION PANEL
Albumin: 2.1 g/dL — ABNORMAL LOW (ref 3.5–5.2)
Calcium: 9.3 mg/dL (ref 8.4–10.5)
Chloride: 118 mEq/L — ABNORMAL HIGH (ref 96–112)
Creatinine, Ser: 3.09 mg/dL — ABNORMAL HIGH (ref 0.50–1.35)
GFR calc non Af Amer: 20 mL/min — ABNORMAL LOW (ref >90)
Phosphorus: 3.8 mg/dL (ref 2.3–4.6)

## 2012-09-25 LAB — GLUCOSE, CAPILLARY: Glucose-Capillary: 139 mg/dL — ABNORMAL HIGH (ref 70–99)

## 2012-09-25 MED ORDER — DEXTROSE 5 % IV SOLN
1.0000 g | INTRAVENOUS | Status: DC
  Administered 2012-09-25 – 2012-09-29 (×5): 1 g via INTRAVENOUS
  Filled 2012-09-25 (×6): qty 1

## 2012-09-25 MED ORDER — FREE WATER
500.0000 mL | Freq: Three times a day (TID) | Status: DC
  Administered 2012-09-25 – 2012-09-26 (×3): 500 mL

## 2012-09-25 MED ORDER — HYDRALAZINE HCL 20 MG/ML IJ SOLN
10.0000 mg | INTRAMUSCULAR | Status: DC | PRN
  Administered 2012-09-25 – 2012-10-02 (×11): 10 mg via INTRAVENOUS
  Filled 2012-09-25 (×13): qty 1

## 2012-09-25 MED ORDER — HYDRALAZINE HCL 25 MG PO TABS
25.0000 mg | ORAL_TABLET | Freq: Three times a day (TID) | ORAL | Status: DC
  Administered 2012-09-25 – 2012-09-26 (×3): 25 mg via ORAL
  Filled 2012-09-25 (×6): qty 1

## 2012-09-25 MED ORDER — VANCOMYCIN HCL IN DEXTROSE 1-5 GM/200ML-% IV SOLN
1000.0000 mg | INTRAVENOUS | Status: DC
  Administered 2012-09-25 – 2012-09-26 (×2): 1000 mg via INTRAVENOUS
  Filled 2012-09-25 (×3): qty 200

## 2012-09-25 MED ORDER — ACETYLCYSTEINE 20 % IN SOLN
8.0000 mL | RESPIRATORY_TRACT | Status: DC
  Filled 2012-09-25: qty 8

## 2012-09-25 NOTE — Progress Notes (Signed)
Subjective:  Febrile overnight but fever curve improving.  BP remains up and down- good UOP overnight, over [name] liter.  Creatinine decreased/BUN stable.  Sodium cont to rise in spite of free water started yest Objective Vital signs in last 24 hours: Filed Vitals:   09/25/12 0500 09/25/12 0501 09/25/12 0600 09/25/12 0700  BP: 143/67 169/96 135/67 173/88  Pulse: 60 72 55 44  Temp:  98.4 F (36.9 C) 98.4 F (36.9 C) 97.9 F (36.6 C)  TempSrc:      Resp:  23 22 22   Height:      Weight: 99.4 kg (219 lb 2.2 oz)     SpO2: 100% 99% 99% 98%   Weight change: -2.8 kg (-6 lb 2.8 oz)  Intake/Output Summary (Last 24 hours) at 09/25/12 0808 Last data filed at 09/25/12 0748  Gross per 24 hour  Intake 2707.65 ml  Output   1182 ml  Net 1525.65 ml   Labs: Basic Metabolic Panel:  Recent Labs Lab 09/21/12 0355  09/23/12 0435 09/24/12 0500 09/25/12 0445  NA 142  < > 145 147*  148* 150*  K 4.0  < > 3.8 3.9  3.9 3.6  CL 109  < > 113* 114*  114* 118*  CO2 20  < > 19 21  20 21   GLUCOSE 113*  < > 99 175*  176* 149*  BUN 48*  < > 67* 69*  70* 73*  CREATININE 4.65*  < > 4.56* 3.53*  3.54* 3.09*  CALCIUM 9.1  < > 9.1 9.3  9.6 9.3  PHOS 3.4  --   --  4.9* 3.8  < > = values in this interval not displayed. Liver Function Tests:  Recent Labs Lab 09/22/12 0331 09/23/12 0435 09/24/12 0500 09/25/12 0445  AST 53* 31 95*  --   ALT 49 35 59*  --   ALKPHOS 50 53 95  --   BILITOT 0.9 0.8 1.2  --   PROT 6.5 6.1 6.7  --   ALBUMIN 2.9* 2.5* 2.4*  2.4* 2.1*    Recent Labs Lab 09/21/12 1130  LIPASE 13   No results found for this basename: AMMONIA,  in the last 168 hours CBC:  Recent Labs Lab 09/19/12 0333  09/20/12 0400 09/21/12 0355 09/22/12 0331 09/23/12 0435 09/24/12 0500  WBC 7.8  < > 6.4 8.0 8.6 7.1 6.9  NEUTROABS 2.8  --   --   --   --   --  5.0  HGB 13.9  < > 12.9* 10.7* 10.9* 9.8* 10.2*  HCT 41.1  < > 38.5* 32.1* 32.3* 29.5* 31.1*  MCV 79.5  < > 79.7 80.0 79.6 79.1  80.4  PLT 224  < > 153 160 143* 155 174  < > = values in this interval not displayed. Cardiac Enzymes:  Recent Labs Lab 09/19/12 0333 09/19/12 0527 09/19/12 1302 09/19/12 1633  TROPONINI <0.30 <0.30 0.31* <0.30   CBG:  Recent Labs Lab 09/24/12 1156 09/24/12 1646 09/24/12 2032 09/25/12 0044 09/25/12 0428  GLUCAP 172* 139* 109* 179* 139*    Iron Studies: No results found for this basename: IRON, TIBC, TRANSFERRIN, FERRITIN,  in the last 72 hours Studies/Results: Dg Chest Port 1 View  09/25/2012   *RADIOLOGY REPORT*  Clinical Data: Atelectasis and shortness of breath.  PORTABLE CHEST - 1 VIEW  Comparison: 09/24/2012  Findings: 0520 hours.  Endotracheal tube tip is 2.6 cm above the base of the carina.  Left IJ central line tip overlies the  innominate vein confluence and the tip is directed laterally and may be positioned against the wall of the venous confluence. The NG tube passes into the stomach although the distal tip position is not included on the film.  Lung volumes are low. The cardiopericardial silhouette is enlarged.  There is left greater than right basilar collapse / consolidation.  IMPRESSION: Lower lung volumes with persistent left greater than right basilar collapse / consolidation.  Marked enlargement the cardiopericardial silhouette.  Cardiomegaly versus pericardial effusion.   Original Report Authenticated By: Misty Stanley, M.D.   Dg Chest Port 1 View  09/24/2012   *RADIOLOGY REPORT*  Clinical Data: Intubated patient.  PORTABLE CHEST - 1 VIEW  Comparison: Plain film chest 09/23/2012.  Findings: Support tubes and lines are unchanged.  Marked enlargement of the cardiopericardial silhouette is identified. Left worse than right basilar airspace disease persists but has improved.  No pneumothorax identified.  IMPRESSION:  1.  Improved left worse than right basilar airspace disease. 2.  Marked enlargement of the cardiopericardial silhouette consistent with cardiomegaly and/or  pericardial effusion.   Original Report Authenticated By: Orlean Patten, M.D.   Medications: Infusions: . sodium chloride 10 mL/hr (09/24/12 0800)  . dextrose 30 mL/hr at 09/24/12 1117  . fentaNYL infusion INTRAVENOUS 100 mcg/hr (09/25/12 0748)  . propofol 20 mcg/kg/min (09/25/12 0744)    Scheduled Medications: . antiseptic oral rinse  15 mL Mouth Rinse QID  . cefTRIAXone (ROCEPHIN)  IV  1 g Intravenous Q24H  . chlorhexidine  15 mL Mouth Rinse BID  . feeding supplement (OSMOLITE 1.5 CAL)  1,000 mL Per Tube Q24H  . feeding supplement  60 mL Per Tube TID  . free water  200 mL Per Tube Q8H  . heparin subcutaneous  5,000 Units Subcutaneous Q8H  . insulin aspart  2-6 Units Subcutaneous Q4H  . insulin glargine  20 Units Subcutaneous QHS  . metoprolol  2.5 mg Intravenous Q6H  . metoprolol tartrate  25 mg Oral BID  . pantoprazole (PROTONIX) IV  40 mg Intravenous QHS    have reviewed scheduled and prn medications.  Physical Exam: General: sedated but arousable on the vent Heart: RRR Lungs: CBS bilat Abdomen: soft non tender, non distended Extremities: minimal edema   Assessment/ Plan: Pt is [name] 67 y.o. yo male who was admitted on 09/19/2012 with respiratory failure/cardiac arrest- variable BP and probable pneumonia  Assessment/Plan: 1. [name] on CRF- documented baseline CKD with creatinine between 1.5 and 2- now with [name] on CRF in the setting of respiratory failure/ arrest/variable BP.  Nonoliguric on no lasix- no indications for HD- will continue to follow UOP, and labs.  Hopefully improving 2. HTN/volume- does not appear too volume overloaded- variable BP continues- getting only PRNs for now- wonder if some of BP spikes has to do with agitation? Still with some lows so hesitant to put him on [name] longer acting agent.  Only PRN is lopressor and now with bradycardia- will give nurse option for hydralazine as well. With continued hypernatremia will increase free water to tube 3. Anemia- dropping  slowly with hosp- will continue to follow 4. ID- per CCM-probable PNA with white out- s/p bronch-  rocephin 5. Vent management- per CCM-    Corey [name]   09/25/2012,8:08 AM  LOS: 6 days

## 2012-09-25 NOTE — Progress Notes (Signed)
Subjective:  The patient spiked a temperature of 101.1 overnight.  The patient continues to follow basic commands.  Creatinine continues to downtrend, with good UOP.  Objective:  Vital Signs in the last 24 hours: Temp:  [98.4 F (36.9 C)-101.1 F (38.4 C)] 98.4 F (36.9 C) (05/14 0501) Pulse Rate:  [48-89] 72 (05/14 0501) Resp:  [18-38] 23 (05/14 0501) BP: (117-209)/(56-124) 169/96 mmHg (05/14 0501) SpO2:  [97 %-100 %] 99 % (05/14 0501) FiO2 (%):  [40 %] 40 % (05/14 0500) Weight:  [219 lb 2.2 oz (99.4 kg)] 219 lb 2.2 oz (99.4 kg) (05/14 0500)  Intake/Output from previous day: 05/13 0701 - 05/14 0700 In: 2588.5 [I.V.:1158.5; NG/GT:1380; IV Piggyback:50] Out: 1281 [Urine:1280; Stool:1]  Physical Exam: General: lying in bed, intubated, sedated HEENT: pupils equal round and reactive to light, oropharynx clear and non-erythematous Neck: supple, no lymphadenopathy Lungs: Mechanical breath sounds, scattered ronchi Heart: regular rate and rhythm, no murmurs, gallops, or rubs Abdomen: Soft, non-tender, non-distended with normoactive bowel sounds. No hepatomegaly. No rebound/guarding. No obvious abdominal masses. Extremities: 2+ pitting edema bilaterally Neurologic: sedated, intubated, opens eyes to voice, tracks movements, follows commands  Lab Results:  Recent Labs  09/23/12 0435 09/24/12 0500  WBC 7.1 6.9  HGB 9.8* 10.2*  PLT 155 174    Recent Labs  09/24/12 0500 09/25/12 0445  NA 147*  148* 150*  K 3.9  3.9 3.6  CL 114*  114* 118*  CO2 21  20 21   GLUCOSE 175*  176* 149*  BUN 69*  70* 73*  CREATININE 3.53*  3.54* 3.09*   No results found for this basename: TROPONINI, CK, MB,  in the last 72 hours  Cardiac Studies: Echocardiogram 09/19/12 - Left ventricle: The cavity size was normal. Davone Shinault thickness was increased in a pattern of severe LVH. Systolic function was normal. The estimated ejection fraction was in the range of 55% to 60%. - Pericardium,  extracardiac: A moderate, free-flowing pericardial effusion was identified circumferential to the heart. There was no evidence of hemodynamic compromise.  Tele: NSR, few PVC's  Assessment/Plan:  The patient is a 67 yo M, history of hypertrophic cardiomyopathy, presents after PEA arrest with hypertensive emergency.   # Hypertensive emergency - the patient presents with initial BP 216/165, with altered mental status, pulmonary edema. The patient's BP decreased soon after admission with sedation alone, then has remained labile since admission despite only small doses of anti-hypertensives.  Given PEA arrest, labile BP's may be a sign of central dysregulation, perhaps suggesting worse neurologic status than previously suspected. -continue metoprolol with hold parameters for SBP < 160  # Acute Respiratory Failure/VDRF - The patient presented with acute respiratory failure, with evidence of pulmonary edema on CXR. The etiology for this event was likely pulmonary edema due to non-compliance with lasix, exacerbated by hypertensive emergency/diastolic dysfunction.  He is s/p bronchoscopy 5/11, due to L lung opacity, and cultures have been negative to date. Currently intubated, sedated. On propofol, fentanyl.  -management per CCM   # Acute on chronic Diastolic CHF - echo from 0000000 shows significant LVH, with grade 2 diastolic dysfunction, though with an EF of 55-60%. Patient has been non-compliant with BP meds. CXR shows evidence of pulmonary edema, and patient has bilateral pitting edema, consistent with volume overload. May have been worsened by hypertensive emergency.   -nephrology consulting -continue metoprolol with hold parameters -patient currently not on ACE-I due to CKD   # PEA Arrest - EMS notes PEA after respiratory  distress and convulsion activity, which reverted to sinus rhythm after approx 1 minute of CPR. This process was likely driven by acute respiratory failure (see above). Troponin only  mildly elevated to 0.31.  # AKI on CKD - Likely due to ATN, due to labile BP's during admission.  Cr continues to downtrend to 3.1 this morning, though this is still increased from patient's baseline of around 1.5-2, though with good UOP. -follow I/O's and Cr  -per nephrology  # Pericardial Effusion. Likely chronic, with interval increase in size from echo 02/2012. No evid for tamponade. ESR normal.  # Fever - patient febrile overnight, possibly representing PNA, concern for aspiration given patient's presentation. -management per CCM, ceftriaxone  # Anemia - hemoglobin slowly downtrending this admission.  No evidence of active bleeding. -continue to follow CBC's  Elnora Morrison, M.D. 09/25/2012, 6:15 AM   Patient examined and agree except changes made. No changes in recommendations today.  Jenell Milliner, MD 09/25/2012 8:18 AM

## 2012-09-25 NOTE — Progress Notes (Signed)
PULMONARY  / CRITICAL CARE MEDICINE  Name: Corey [name] MRN: [id number] DOB: [birth year]    ADMISSION DATE:  09/19/2012 CONSULTATION DATE:  09/19/2012  REFERRING MD :  Christy Gentles PRIMARY SERVICE: PCCM  CHIEF COMPLAINT:  Acute respiratory failure  BRIEF PATIENT DESCRIPTION: 67 y/o male with LVH and diastolic dysfunction presented by EMS on 5/8 to the [facility] ED after he had the sudden onset of respiratory failure at home.  He had a seizure and brief cardiac arrest requiring <31min of CPR.    SIGNIFICANT EVENTS / STUDIES:  5/8 CT head-peripheral and tentorial calcifications, increased since 2009 MRI 5/8 Echo- EF 55-60% 5/9: medical renal disease. No hydro 5/11 bronchoscopy-pending culture 5/13- peep to 5  LINES / TUBES: 5/8 ETT >> 5/8 peripheral IV >> 5/10 LIJ >> 5/10 Art Cath>>  CULTURES: Blood 5/9>>>ng UC 5/9>>>ng Sputum 5/10: NF  ANTIBIOTICS: vanc 5/9>>>5/12 Zosyn 5/9 (fever)>>>5/12 Ceftriaxone 5/12>>>  SUBJECTIVE:   BP remains labile, following commands, remains in pos balance, secretions still present  VITAL SIGNS: Temp:  [97.9 F (36.6 C)-101.1 F (38.4 C)] 97.9 F (36.6 C) (05/14 0700) Pulse Rate:  [44-89] 44 (05/14 0700) Resp:  [18-38] 22 (05/14 0700) BP: (117-209)/(56-124) 173/88 mmHg (05/14 0700) SpO2:  [97 %-100 %] 98 % (05/14 0700) FiO2 (%):  [40 %] 40 % (05/14 0500) Weight:  [219 lb 2.2 oz (99.4 kg)] 219 lb 2.2 oz (99.4 kg) (05/14 0500) HEMODYNAMICS: CVP:  [12 mmHg] 12 mmHg VENTILATOR SETTINGS: Vent Mode:  [-] PRVC FiO2 (%):  [40 %] 40 % Set Rate:  [22 bmp] 22 bmp Vt Set:  [600 mL] 600 mL PEEP:  [5 cmH20-8 cmH20] 5 cmH20 Plateau Pressure:  [16 cmH20-22 cmH20] 22 cmH20 INTAKE / OUTPUT: Intake/Output     05/13 0701 - 05/14 0700 05/14 0701 - 05/15 0700   I.V. (mL/kg) 1272.7 (12.8) 12.1 (0.1)   NG/GT 1430    IV Piggyback 50    Total Intake(mL/kg) 2752.7 (27.7) 12.1 (0.1)   Urine (mL/kg/hr) 1280 (0.5)    Stool 1 (0) 1 (0)   Total Output 1281 1   Net  +1471.7 +11.1         PHYSICAL EXAMINATION: Gen: sedated on vent, intubated, in NAD HEENT: NCAT, PERRL, EOMi, ETT in place PULM: Rhonchi B, more decreased left CV: RRR, distant heart sounds, no clear JVD, no m/r/g AB: BS+, soft, nontender, no hsm Ext: warm, trace pretibial edema, no clubbing, no cyanosis, MAEs Derm: no rash or skin breakdown Neuro: sedated on vent but arouses to voice, withdraws to pain, makes eye contact  LABS:  Recent Labs Lab 09/23/12 0435 09/24/12 0500 09/25/12 0445  NA 145 147*  148* 150*  K 3.8 3.9  3.9 3.6  CL 113* 114*  114* 118*  CO2 19 21  20 21   BUN 67* 69*  70* 73*  CREATININE 4.56* 3.53*  3.54* 3.09*  GLUCOSE 99 175*  176* 149*    Recent Labs Lab 09/22/12 0331 09/23/12 0435 09/24/12 0500  HGB 10.9* 9.8* 10.2*  HCT 32.3* 29.5* 31.1*  WBC 8.6 7.1 6.9  PLT 143* 155 174   ABG    Component Value Date/Time   PHART 7.382 09/22/2012 0500   PCO2ART 33.6* 09/22/2012 0500   PO2ART 75.3* 09/22/2012 0500   HCO3 19.5* 09/22/2012 0500   TCO2 20.6 09/22/2012 0500   ACIDBASEDEF 4.6* 09/22/2012 0500   O2SAT 93.5 09/22/2012 0500  Triglycerides 123   Recent Labs Lab 09/19/12 0527 09/19/12 0800  09/21/12 0355  09/21/12 1051 09/22/12 0331 09/23/12 0435 09/24/12 0500  PROCALCITON  --   --   --   --  5.51 5.58 4.34  --   WBC 4.4  --   < > 8.0  --  8.6 7.1 6.9  LATICACIDVEN  --  3.8*  --   --   --   --   --   --   < > = values in this interval not displayed.  Recent Labs Lab 09/24/12 1156 09/24/12 1646 09/24/12 2032 09/25/12 0044 09/25/12 0428  GLUCAP 172* 139* 109* 179* 139*   5/14- increase Left hazzines / atx?  ASSESSMENT / PLAN:  PULMONARY A: Acute respiratory failure due to pulmonary edema Now w/ LLL worse vol loss involving almost entire left thorax. Possible mucous plugging. Requiring higher FIO2 and bronch 5/11 P:   -consider lasix to neg balance -pcxr follow up needed -chest  Pt -consider bronch with reoccuring atx  left and secretion status -wan attempt cpap 5 ps 5, failed, see plan above  CARDIOVASCULAR A: Acute on chronic CHF presumably due to hypertensive emergency Pericardial effusion, moderate, no tamponade on echo 5/8 Hypertension, wife reports occasional medication non-compliance PEA arrest (in setting of decomp HF) Cards following. He is now off NTG. BP labile.  P:  - map goal >65 -Echo 5/8- EF 55-60% -continue metoprolol, hydralazine added as brady -add hydralazine oral -may need addition clonidine  RENAL A:  Acute on Chronic renal failure, CKD, at baseline, Cr worsening BP up and down , prob ATN Hypernatremia P:   - concern int edema, pos balance daily -free water and d5w added, Na still not improving, increase  -trend BUN/Creatinine, lytes  GASTROINTESTINAL A:  No acute issues P:   - Protonix SUP - Continue TF, hold for bronch -had a BM, yeah!  HEMATOLOGIC A: Anemia (due to CKD?), dilutiona as well, increased hct P:  -continue [place] heparin  INFECTIOUS A: fever, possible aspiration PNA P:   follow bronch specimen, remains neg Fever, failed weaning worsening collapse, change ceftriaxone to vanc, ceftaz  ENDOCRINE A:  DM2, on 70/30 35 units qHS at home P:   - Glargine 20 units scheduled, adding d5w - ICU hyperglycemia protocol. - follow CBG  NEUROLOGIC A:  Acute encephalopathy due to hypoxemia; improved mental status after intubation ?Seizure at home> currently mental status is OK, hypoxemia most likely explanation which has now resolved P:   - Fentanyl/propofol gtt- -WUA   [name], PA-S  I have fully examined this patient and agree with above findings.    And edited in full  Cm time 30 min   [name]. [name], MD, [place] Pgr: [place] [facility]

## 2012-09-25 NOTE — Procedures (Signed)
Bronchoscopy Procedure Note Corey [name] [id number] May 13, 1946  Procedure: Bronchoscopy Indications: Diagnostic evaluation of the airways and Remove secretions  Procedure Details Consent: Risks of procedure as well as the alternatives and risks of each were explained to the (patient/caregiver).  Consent for procedure obtained. Time Out: Verified patient identification, verified procedure, site/side was marked, verified correct patient position, special equipment/implants available, medications/allergies/relevent history reviewed, required imaging and test results available.  Performed  In preparation for procedure, patient was given 100% FiO2 and bronchoscope lubricated. Sedation: proprofol  Airway entered and the following bronchi were examined: RUL, RML, RLL, LUL, LLL and Bronchi.   Procedures performed: Brushings performed Bronchoscope removed.    Evaluation Hemodynamic Status: BP stable throughout; O2 sats: stable throughout Patient's Current Condition: stable Specimens:  None Complications: No apparent complications Patient did tolerate procedure well.   Corey [name]. 09/25/2012   1. No mucous plugs 2. Mild residual bronch trauma from prior bronch at upper division  [name]. [name], MD, [place] Pgr: [place] [facility]

## 2012-09-25 NOTE — Progress Notes (Signed)
ANTIBIOTIC CONSULT NOTE - INITIAL  Pharmacy Consult for Vancomycin + Ceftazidime Indication: r/o HCAP  Allergies  Allergen Reactions  . Hydrochlorothiazide     Gout , uncontrolled diabetes and renal insufficiency  . Allopurinol     Renal insufficiency    Patient Measurements: Height: 5\' 6"  (167.6 cm) Weight: 219 lb 2.2 oz (99.4 kg) IBW/kg (Calculated) : 63.8  Vital Signs: Temp: 100 F (37.8 C) (05/14 0918) Temp src: Core (Comment) (05/14 0800) BP: 236/108 mmHg (05/14 0918) Pulse Rate: 100 (05/14 0918) Intake/Output from previous day: 05/13 0701 - 05/14 0700 In: 2752.7 [I.V.:1272.7; NG/GT:1430; IV Piggyback:50] Out: [id number] [Urine:1280; Stool:1] Intake/Output from this shift: Total I/O In: 171 [I.V.:121; NG/GT:50] Out: 126 [Urine:125; Stool:1]  Labs:  Recent Labs  09/22/12 1606 09/23/12 0435 09/24/12 0500 09/25/12 0445  WBC  --  7.1 6.9  --   HGB  --  9.8* 10.2*  --   PLT  --  155 174  --   LABCREA 228.71  --   --   --   CREATININE  --  4.56* 3.53*  3.54* 3.09*   Estimated Creatinine Clearance: 26.6 ml/min (by C-G formula based on Cr of 3.09). No results found for this basename: VANCOTROUGH, Corlis Leak, VANCORANDOM, [place], GENTPEAK, GENTRANDOM, [place], TOBRAPEAK, TOBRARND, AMIKACINPEAK, AMIKACINTROU, AMIKACIN,  in the last 72 hours   Microbiology: Recent Results (from the past 720 hour(s))  URINE CULTURE     Status: None   Collection Time    09/19/12  4:26 AM      Result Value Range Status   Specimen Description URINE, RANDOM   Final   Special Requests NONE   Final   Culture  Setup Time 09/19/2012 09:16   Final   Colony Count NO GROWTH   Final   Culture NO GROWTH   Final   Report Status 09/20/2012 FINAL   Final  MRSA PCR SCREENING     Status: None   Collection Time    09/19/12  6:19 AM      Result Value Range Status   MRSA by PCR NEGATIVE  NEGATIVE Final   Comment:            The GeneXpert MRSA Assay (FDA     approved for NASAL specimens   only), is one component of a     comprehensive MRSA colonization     surveillance program. It is not     intended to diagnose MRSA     infection nor to guide or     monitor treatment for     MRSA infections.  CULTURE, BLOOD (ROUTINE X 2)     Status: None   Collection Time    09/20/12  9:50 AM      Result Value Range Status   Specimen Description BLOOD RIGHT HAND   Final   Special Requests BOTTLES DRAWN AEROBIC AND ANAEROBIC 10CC   Final   Culture  Setup Time 09/20/2012 14:17   Final   Culture     Final   Value:        BLOOD CULTURE RECEIVED NO GROWTH TO DATE CULTURE WILL BE HELD FOR 5 DAYS BEFORE ISSUING A FINAL NEGATIVE REPORT   Report Status PENDING   Incomplete  CULTURE, BLOOD (ROUTINE X 2)     Status: None   Collection Time    09/20/12  9:55 AM      Result Value Range Status   Specimen Description BLOOD LEFT HAND   Final   Special Requests BOTTLES  DRAWN AEROBIC AND ANAEROBIC 10CC   Final   Culture  Setup Time 09/20/2012 14:13   Final   Culture     Final   Value:        BLOOD CULTURE RECEIVED NO GROWTH TO DATE CULTURE WILL BE HELD FOR 5 DAYS BEFORE ISSUING A FINAL NEGATIVE REPORT   Report Status PENDING   Incomplete  CULTURE, RESPIRATORY (NON-EXPECTORATED)     Status: None   Collection Time    09/21/12  3:20 PM      Result Value Range Status   Specimen Description TRACHEAL ASPIRATE   Final   Special Requests Normal   Final   Gram Stain     Final   Value: FEW WBC PRESENT, PREDOMINANTLY PMN     FEW SQUAMOUS EPITHELIAL CELLS PRESENT     FEW GRAM POSITIVE COCCI IN PAIRS     RARE GRAM NEGATIVE RODS   Culture Non-Pathogenic Oropharyngeal-type Flora Isolated.   Final   Report Status 09/24/2012 FINAL   Final  CULTURE, RESPIRATORY (NON-EXPECTORATED)     Status: None   Collection Time    09/22/12 12:45 PM      Result Value Range Status   Specimen Description TRACHEAL ASPIRATE   Final   Special Requests NONE   Final   Gram Stain     Final   Value: RARE WBC PRESENT,  PREDOMINANTLY PMN     NO SQUAMOUS EPITHELIAL CELLS SEEN     NO ORGANISMS SEEN   Culture Non-Pathogenic Oropharyngeal-type Flora Isolated.   Final   Report Status 09/24/2012 FINAL   Final    Medical History: Past Medical History  Diagnosis Date  . Renal insufficiency   . Diabetes mellitus   . Hypertension   . Gout   . Colon polyp 2003    Dr [name]; F/U was to be 2008( not completed)  . Diverticulosis 2003  . Pneumonia 09/29/11    Avelox X 10 daysas OP    Assessment: 67 y.o. M with antibiotics de-escalated to Rocephin monotherapy on 5/12 for r/o PNA/sepsis and now to re-broaden antibiotic coverage back to Vancomycin + Ceftazidime in the setting of fevers and worsening lung collapse. Tmax/24h: 101.1, WBC wnl, SCr 3.09, CrCl~20-30 ml/min, UOP/24h: 0.5 ml/kg/hr.   The patient's last Vancomycin dose was on 09/22/12 -- this has likely been cleared by now given the patient's improving renal function. Will dose Vancomycin cautiously given the patient's recent acute renal failure and will plan to monitor UOP closely.   Goal of Therapy:  Vancomycin trough level 15-20 mcg/ml  Plan:  1. Vancomycin 1g IV every 24 hours 2. Ceftazidime 1g IV every 24 hours 3. Will continue to follow renal function, culture results, LOT, and antibiotic de-escalation plans   Alycia Rossetti, PharmD, BCPS Clinical Pharmacist Pager: [phone] 09/25/2012 10:28 AM

## 2012-09-26 ENCOUNTER — Inpatient Hospital Stay (HOSPITAL_COMMUNITY): Payer: No Typology Code available for payment source

## 2012-09-26 LAB — GLUCOSE, CAPILLARY
Glucose-Capillary: 153 mg/dL — ABNORMAL HIGH (ref 70–99)
Glucose-Capillary: 166 mg/dL — ABNORMAL HIGH (ref 70–99)
Glucose-Capillary: 176 mg/dL — ABNORMAL HIGH (ref 70–99)

## 2012-09-26 LAB — POCT I-STAT 3, ART BLOOD GAS (G3+)
O2 Saturation: 92 %
TCO2: 20 mmol/L (ref 0–100)
pCO2 arterial: 40 mmHg (ref 35.0–45.0)
pO2, Arterial: 76 mmHg — ABNORMAL LOW (ref 80.0–100.0)

## 2012-09-26 LAB — CBC WITH DIFFERENTIAL/PLATELET
Basophils Absolute: 0 10*3/uL (ref 0.0–0.1)
Basophils Relative: 1 % (ref 0–1)
Eosinophils Relative: 7 % — ABNORMAL HIGH (ref 0–5)
HCT: 30.1 % — ABNORMAL LOW (ref 39.0–52.0)
Hemoglobin: 9.6 g/dL — ABNORMAL LOW (ref 13.0–17.0)
MCHC: 31.9 g/dL (ref 30.0–36.0)
MCV: 81.6 fL (ref 78.0–100.0)
Monocytes Absolute: 0.5 10*3/uL (ref 0.1–1.0)
Monocytes Relative: 10 % (ref 3–12)
RDW: 14.9 % (ref 11.5–15.5)

## 2012-09-26 LAB — CULTURE, BLOOD (ROUTINE X 2)

## 2012-09-26 LAB — BASIC METABOLIC PANEL
BUN: 74 mg/dL — ABNORMAL HIGH (ref 6–23)
CO2: 17 mEq/L — ABNORMAL LOW (ref 19–32)
Chloride: 110 mEq/L (ref 96–112)
Creatinine, Ser: 2.74 mg/dL — ABNORMAL HIGH (ref 0.50–1.35)
Glucose, Bld: 178 mg/dL — ABNORMAL HIGH (ref 70–99)

## 2012-09-26 MED ORDER — HYDRALAZINE HCL 50 MG PO TABS
50.0000 mg | ORAL_TABLET | Freq: Three times a day (TID) | ORAL | Status: DC
  Administered 2012-09-26 – 2012-10-02 (×19): 50 mg via ORAL
  Filled 2012-09-26 (×21): qty 1

## 2012-09-26 MED ORDER — AMLODIPINE BESYLATE 10 MG PO TABS
10.0000 mg | ORAL_TABLET | Freq: Every day | ORAL | Status: DC
  Administered 2012-09-26 – 2012-09-27 (×2): 10 mg via ORAL
  Filled 2012-09-26 (×3): qty 1

## 2012-09-26 MED ORDER — POTASSIUM CHLORIDE 20 MEQ/15ML (10%) PO LIQD: 20.0000 meq | Freq: Once | ORAL | Status: DC

## 2012-09-26 MED ORDER — VECURONIUM BROMIDE 10 MG IV SOLR
5.0000 mg | Freq: Once | INTRAVENOUS | Status: DC
  Filled 2012-09-26: qty 10

## 2012-09-26 MED ORDER — INSULIN GLARGINE 100 UNIT/ML ~~LOC~~ SOLN
15.0000 [IU] | Freq: Every day | SUBCUTANEOUS | Status: DC
  Administered 2012-09-26 – 2012-09-28 (×3): 15 [IU] via SUBCUTANEOUS
  Filled 2012-09-26 (×4): qty 0.15

## 2012-09-26 MED ORDER — FENTANYL CITRATE 0.05 MG/ML IJ SOLN: 100.0000 ug | Freq: Once | INTRAMUSCULAR | Status: DC

## 2012-09-26 MED ORDER — PANTOPRAZOLE SODIUM 40 MG PO PACK
40.0000 mg | PACK | Freq: Every day | ORAL | Status: DC
  Administered 2012-09-26 – 2012-10-01 (×6): 40 mg
  Filled 2012-09-26 (×7): qty 20

## 2012-09-26 MED ORDER — FREE WATER: 500.0000 mL | Freq: Every day | Status: DC

## 2012-09-26 MED ORDER — FUROSEMIDE 10 MG/ML IJ SOLN
80.0000 mg | Freq: Three times a day (TID) | INTRAMUSCULAR | Status: DC
  Administered 2012-09-26 – 2012-09-27 (×3): 80 mg via INTRAVENOUS
  Filled 2012-09-26 (×5): qty 8

## 2012-09-26 NOTE — Progress Notes (Signed)
PULMONARY  / CRITICAL CARE MEDICINE  Name: Corey [name] MRN: [id number] DOB: [birth year]    ADMISSION DATE:  09/19/2012 CONSULTATION DATE:  09/19/2012  REFERRING MD :  Christy Gentles PRIMARY SERVICE: PCCM  CHIEF COMPLAINT:  Acute respiratory failure  BRIEF PATIENT DESCRIPTION: 67 y/o male with LVH and diastolic dysfunction presented by EMS on 5/8 to the [facility] ED after he had the sudden onset of respiratory failure at home.  He had a seizure and brief cardiac arrest requiring <56min of CPR.    SIGNIFICANT EVENTS / STUDIES:  5/8 CT head-peripheral and tentorial calcifications, increased since 2009 MRI 5/8 Echo- EF 55-60% 5/9: medical renal disease. No hydro 5/11 bronchoscopy-pending culture 5/13- peep to 5  LINES / TUBES: 5/8 ETT >> 5/8 peripheral IV >> 5/10 LIJ >> 5/10 Art Cath>>  CULTURES: Blood 5/9>>>ng UC 5/9>>>ng Sputum 5/10: NF  ANTIBIOTICS: vanc 5/9>>>5/12, 5/14>>> Zosyn 5/9 (fever)>>>5/12 Ceftriaxone 5/12>>>5/13 Ceftazidime 5/14>>>  SUBJECTIVE:   BP remains labile, remains in pos balance, becomes agitated and O2 sats drop upon awakening.  Nurse overnight unable to assess patient's ability to follow commands due to need for sedation on vent.  VITAL SIGNS: Temp:  [97.9 F (36.6 C)-100.4 F (38 C)] 99.1 F (37.3 C) (05/15 0600) Pulse Rate:  [51-101] 78 (05/15 0600) Resp:  [14-37] 27 (05/15 0600) BP: (109-252)/(58-142) 175/76 mmHg (05/15 0600) SpO2:  [88 %-100 %] 95 % (05/15 0600) FiO2 (%):  [40 %] 40 % (05/15 0412) Weight:  [230 lb 13.2 oz (104.7 kg)] 230 lb 13.2 oz (104.7 kg) (05/15 0500) HEMODYNAMICS: CVP:  [8 mmHg-10 mmHg] 10 mmHg VENTILATOR SETTINGS: Vent Mode:  [-] PRVC FiO2 (%):  [40 %] 40 % Set Rate:  [22 bmp] 22 bmp Vt Set:  [600 mL] 600 mL PEEP:  [5 cmH20] 5 cmH20 Pressure Support:  [5 cmH20] 5 cmH20 Plateau Pressure:  [19 cmH20-27 cmH20] 20 cmH20 INTAKE / OUTPUT: Intake/Output     05/14 0701 - 05/15 0700 05/15 0701 - 05/16 0700   I.V. (mL/kg) 2009.8  (19.2)    NG/GT 2325    IV Piggyback 250    Total Intake(mL/kg) 4584.8 (43.8)    Urine (mL/kg/hr) 1500 (0.6)    Stool 2 (0)    Total Output 1502     Net +3082.8          Stool Occurrence 2 x     PHYSICAL EXAMINATION: Gen: sedated on vent, intubated, in NAD HEENT: NCAT, PERRL, EOMi, ETT in place PULM: Decreased breath sounds LLL CV: RRR, distant heart sounds, no clear JVD, no m/r/g AB: BS+, soft, nontender, no hsm Ext: warm, trace pretibial edema, no clubbing, no cyanosis, MAEs Derm: no rash or skin breakdown Neuro: sedated on vent, withdraws to pain  LABS:  Recent Labs Lab 09/24/12 0500 09/25/12 0445 09/26/12 0400  NA 147*  148* 150* 141  K 3.9  3.9 3.6 3.4*  CL 114*  114* 118* 110  CO2 21  20 21  17*  BUN 69*  70* 73* 74*  CREATININE 3.53*  3.54* 3.09* 2.74*  GLUCOSE 175*  176* 149* 178*    Recent Labs Lab 09/23/12 0435 09/24/12 0500 09/26/12 0400  HGB 9.8* 10.2* 9.6*  HCT 29.5* 31.1* 30.1*  WBC 7.1 6.9 5.3  PLT 155 174 207   ABG    Component Value Date/Time   PHART 7.382 09/22/2012 0500   PCO2ART 33.6* 09/22/2012 0500   PO2ART 75.3* 09/22/2012 0500   HCO3 19.5* 09/22/2012 0500   TCO2 20.6  09/22/2012 0500   ACIDBASEDEF 4.6* 09/22/2012 0500   O2SAT 93.5 09/22/2012 0500  Triglycerides 123   Recent Labs Lab 09/19/12 0800  09/21/12 0355 09/21/12 1051 09/22/12 0331 09/23/12 0435 09/24/12 0500 09/26/12 0400  PROCALCITON  --   --   --  5.51 5.58 4.34  --   --   WBC  --   < > 8.0  --  8.6 7.1 6.9 5.3  LATICACIDVEN 3.8*  --   --   --   --   --   --   --   < > = values in this interval not displayed.  Recent Labs Lab 09/25/12 1219 09/25/12 1636 09/25/12 1934 09/26/12 0045 09/26/12 0359  GLUCAP 167* 152* 154* 153* 169*   5/15- increased Left hazzines / atx?, mostly unchanged from yesterday  ASSESSMENT / PLAN:  PULMONARY A: Acute respiratory failure due to pulmonary edema Now w/ LLL worse vol loss involving almost entire left thorax.  Possible mucous plugging. Requiring higher FIO2 and bronch 5/11 P:   -consider lasix to neg balance, will d/w renal -pcxr follow up needed -chest  Pt, consider dc -bronch 5/14 did not reveal mucous plug -cpap5 ps 5, at 2 hrs, abg reviewed, not ready extubate, feel he will need neg balance  CARDIOVASCULAR A: Acute on chronic CHF presumably due to hypertensive emergency Pericardial effusion, moderate, no tamponade on echo 5/8 Hypertension, wife reports occasional medication non-compliance PEA arrest (in setting of decomp HF) Cards following. He is now off NTG.  P:  - map goal >65 -Echo 5/8- EF 55-60% -continue metoprolol and hydralazine (increase to 50 ) -may need addition clonidine, BP remains labile, add home norvasc first  RENAL A:  Acute on Chronic renal failure, CKD, at baseline, Cr worsening BP up and down , prob ATN Hypernatremia-resolved P:   - concern int edema, pos balance daily, likley an issue for weaing, consider lasix -trend BUN/Creatinine, lytes -dc free water, kvo d5w  GASTROINTESTINAL A:  No acute issues P:   - Protonix SUP - Continue TF  HEMATOLOGIC A: Anemia (due to CKD?), dilutional as well, increased hct P:  -continue [place] heparin Cbc reviewed  INFECTIOUS A: fever, possible aspiration PNA P:   Follow bronch specimen, remains neg Fever resolved Continue Vanc and Ceftaz, follow BAl, narrow in am   ENDOCRINE A:  DM2, on 70/30 35 units qHS at home P:   - Glargine 20 units scheduled, may need to reduce as d5w to reduce - ICU hyperglycemia protocol. - follow CBG, remains elevated  NEUROLOGIC A:  Acute encephalopathy due to hypoxemia; improved mental status after intubation ?Seizure at home> currently mental status is OK, hypoxemia most likely explanation which has now resolved P:   - Fentanyl/propofol gtt review last TG -WUA  -chair position  [name], PA-S  I have fully examined this patient and agree with above findings.    And edited  in full  Cm time 30 min   [name]. [name], MD, [place] Pgr: [facility]

## 2012-09-26 NOTE — Progress Notes (Signed)
[facility] ADULT ICU REPLACEMENT PROTOCOL FOR AM LAB REPLACEMENT ONLY  The patient does not apply for the [facility] Adult ICU Electrolyte Replacment Protocol based on the criteria listed below:   23. Is BUN < 60 mg/dL? no  Patient's BUN today is 74 6. If a panic level lab has been reported, has the CCM MD in charge been notified? yes.   Physician:  Dr. Alpha Gula, Myla Mauriello P 09/26/2012 5:06 AM

## 2012-09-26 NOTE — Progress Notes (Signed)
Subjective:  Afebrile overnight.  BP remains up and down- good UOP overnight, over a liter.  Creatinine decreased/BUN stable.  Sodium better  Objective Vital signs in last 24 hours: Filed Vitals:   09/26/12 0400 09/26/12 0412 09/26/12 0500 09/26/12 0600  BP: 126/67 126/67 207/101 175/76  Pulse: 51 55 71 78  Temp: 98.4 F (36.9 C)  98.2 F (36.8 C) 99.1 F (37.3 C)  TempSrc: Core (Comment)     Resp: 35 22 21 27   Height:      Weight:   104.7 kg (230 lb 13.2 oz)   SpO2: 97% 100% 96% 95%   Weight change: 5.3 kg (11 lb 11 oz)  Intake/Output Summary (Last 24 hours) at 09/26/12 0720 Last data filed at 09/26/12 0600  Gross per 24 hour  Intake 4584.76 ml  Output   1502 ml  Net 3082.76 ml   Labs: Basic Metabolic Panel:  Recent Labs Lab 09/21/12 0355  09/24/12 0500 09/25/12 0445 09/26/12 0400  NA 142  < > 147*  148* 150* 141  K 4.0  < > 3.9  3.9 3.6 3.4*  CL 109  < > 114*  114* 118* 110  CO2 20  < > 21  20 21  17*  GLUCOSE 113*  < > 175*  176* 149* 178*  BUN 48*  < > 69*  70* 73* 74*  CREATININE 4.65*  < > 3.53*  3.54* 3.09* 2.74*  CALCIUM 9.1  < > 9.3  9.6 9.3 9.2  PHOS 3.4  --  4.9* 3.8  --   < > = values in this interval not displayed. Liver Function Tests:  Recent Labs Lab 09/22/12 0331 09/23/12 0435 09/24/12 0500 09/25/12 0445  AST 53* 31 95*  --   ALT 49 35 59*  --   ALKPHOS 50 53 95  --   BILITOT 0.9 0.8 1.2  --   PROT 6.5 6.1 6.7  --   ALBUMIN 2.9* 2.5* 2.4*  2.4* 2.1*    Recent Labs Lab 09/21/12 1130  LIPASE 13   No results found for this basename: AMMONIA,  in the last 168 hours CBC:  Recent Labs Lab 09/21/12 0355 09/22/12 0331 09/23/12 0435 09/24/12 0500 09/26/12 0400  WBC 8.0 8.6 7.1 6.9 5.3  NEUTROABS  --   --   --  5.0 3.6  HGB 10.7* 10.9* 9.8* 10.2* 9.6*  HCT 32.1* 32.3* 29.5* 31.1* 30.1*  MCV 80.0 79.6 79.1 80.4 81.6  PLT 160 143* 155 174 207   Cardiac Enzymes:  Recent Labs Lab 09/19/12 1302 09/19/12 1633   TROPONINI 0.31* <0.30   CBG:  Recent Labs Lab 09/25/12 1219 09/25/12 1636 09/25/12 1934 09/26/12 0045 09/26/12 0359  GLUCAP 167* 152* 154* 153* 169*    Iron Studies: No results found for this basename: IRON, TIBC, TRANSFERRIN, FERRITIN,  in the last 72 hours Studies/Results: Dg Chest Port 1 View  09/25/2012   *RADIOLOGY REPORT*  Clinical Data: Atelectasis and shortness of breath.  PORTABLE CHEST - 1 VIEW  Comparison: 09/24/2012  Findings: 0520 hours.  Endotracheal tube tip is 2.6 cm above the base of the carina.  Left IJ central line tip overlies the innominate vein confluence and the tip is directed laterally and may be positioned against the wall of the venous confluence. The NG tube passes into the stomach although the distal tip position is not included on the film.  Lung volumes are low. The cardiopericardial silhouette is enlarged.  There is left greater than  right basilar collapse / consolidation.  IMPRESSION: Lower lung volumes with persistent left greater than right basilar collapse / consolidation.  Marked enlargement the cardiopericardial silhouette.  Cardiomegaly versus pericardial effusion.   Original Report Authenticated By: Misty Stanley, M.D.   Medications: Infusions: . sodium chloride 10 mL/hr at 09/25/12 2030  . dextrose 50 mL/hr at 09/26/12 0200  . fentaNYL infusion INTRAVENOUS 150 mcg/hr (09/26/12 0600)  . propofol 30 mcg/kg/min (09/26/12 0600)    Scheduled Medications: . antiseptic oral rinse  15 mL Mouth Rinse QID  . cefTAZidime (FORTAZ)  IV  1 g Intravenous Q24H  . chlorhexidine  15 mL Mouth Rinse BID  . feeding supplement (OSMOLITE 1.5 CAL)  1,000 mL Per Tube Q24H  . feeding supplement  60 mL Per Tube TID  . free water  500 mL Per Tube Q8H  . heparin subcutaneous  5,000 Units Subcutaneous Q8H  . hydrALAZINE  25 mg Oral Q8H  . insulin aspart  2-6 Units Subcutaneous Q4H  . insulin glargine  20 Units Subcutaneous QHS  . metoprolol  2.5 mg Intravenous Q6H   . metoprolol tartrate  25 mg Oral BID  . pantoprazole (PROTONIX) IV  40 mg Intravenous QHS  . potassium chloride  20 mEq Oral Once  . vancomycin  1,000 mg Intravenous Q24H    have reviewed scheduled and prn medications.  Physical Exam: General: sedated but arousable on the vent Heart: RRR Lungs: CBS bilat Abdomen: soft non tender, non distended Extremities: minimal edema   Assessment/ Plan: Pt is a 67 y.o. yo male who was admitted on 09/19/2012 with respiratory failure/cardiac arrest- variable BP and probable pneumonia  Assessment/Plan: 1. A on CRF- documented baseline CKD with creatinine between 1.5 and 2- now with A on CRF in the setting of respiratory failure/ arrest/variable BP.  Nonoliguric on no lasix- no indications for HD- will continue to follow UOP, and labs.  Hopefully improving 2. HTN/volume- does not appear too volume overloaded- variable BP continues- getting only PRNs for now- wonder if some of BP spikes has to do with agitation? Still with some lows ([id number])  so hesitant to put him on a longer acting agent.   PRN  lopressor and  hydralazine as well. Hypernatremia better will back off on free water some. Also on D5, could stop ? 3. Anemia- dropping slowly with hosp- will continue to follow 4. ID- per CCM-probable PNA with white out- s/p bronch-  Now fortaz and vanc with better temp 5. Vent management- per CCM- I would hope once off the vent can get BP better stabilized   Semiah Konczal A   09/26/2012,7:20 AM  LOS: 7 days

## 2012-09-26 NOTE — Progress Notes (Signed)
Subjective:  The patient had a Tmax overnight of 100.4.  BP's remain labile, despite the addition of hydralazine yesterday.  Bronch performed yesterday showed no mucous plugs.  CXR this morning shows continued LLL opacity, minimally changed from yesterday.  Objective:  Vital Signs in the last 24 hours: Temp:  [97.9 F (36.6 C)-100.4 F (38 C)] 99.1 F (37.3 C) (05/15 0600) Pulse Rate:  [44-101] 78 (05/15 0600) Resp:  [14-37] 27 (05/15 0600) BP: (109-252)/(58-142) 175/76 mmHg (05/15 0600) SpO2:  [88 %-100 %] 95 % (05/15 0600) FiO2 (%):  [40 %] 40 % (05/15 0412) Weight:  [230 lb 13.2 oz (104.7 kg)] 230 lb 13.2 oz (104.7 kg) (05/15 0500)  Intake/Output from previous day: 05/14 0701 - 05/15 0700 In: 4584.8 [I.V.:2009.8; NG/GT:2325; IV Piggyback:250] Out: 1502 [Urine:1500; Stool:2]  Physical Exam: General: lying in bed, intubated, sedated HEENT: pupils equal round and reactive to light, oropharynx clear and non-erythematous Neck: supple, no lymphadenopathy Lungs: Mechanical breath sounds, scattered ronchi Heart: regular rate and rhythm, no murmurs, gallops, or rubs Abdomen: Soft, non-tender, non-distended with normoactive bowel sounds. No hepatomegaly. No rebound/guarding. No obvious abdominal masses. Extremities: 2+ pitting edema bilaterally Neurologic: sedated, intubated, opens eyes to voice  Lab Results:  Recent Labs  09/24/12 0500 09/26/12 0400  WBC 6.9 5.3  HGB 10.2* 9.6*  PLT 174 207    Recent Labs  09/25/12 0445 09/26/12 0400  NA 150* 141  K 3.6 3.4*  CL 118* 110  CO2 21 17*  GLUCOSE 149* 178*  BUN 73* 74*  CREATININE 3.09* 2.74*   No results found for this basename: TROPONINI, CK, MB,  in the last 72 hours  Cardiac Studies: Echocardiogram 09/19/12 - Left ventricle: The cavity size was normal. Wall thickness was increased in a pattern of severe LVH. Systolic function was normal. The estimated ejection fraction was in the range of 55% to 60%. -  Pericardium, extracardiac: A moderate, free-flowing pericardial effusion was identified circumferential to the heart. There was no evidence of hemodynamic compromise.  Tele: NSR, few PVC's  Assessment/Plan:  The patient is a 67 yo M, history of hypertrophic cardiomyopathy, presents after PEA arrest with hypertensive emergency.   # Hypertensive emergency - the patient presents with initial BP 216/165, with altered mental status, pulmonary edema. The patient's BP decreased soon after admission with sedation alone, then has remained labile since admission despite only small doses of anti-hypertensives.  Given PEA arrest, labile BP's may be a sign of central dysregulation, perhaps suggesting worse neurologic status than previously suspected. -continue metoprolol with hold parameters for SBP < 160 -continue hydralazine   # Acute Respiratory Failure/VDRF - The patient presented with acute respiratory failure, with evidence of pulmonary edema on CXR. The etiology for this event was likely pulmonary edema due to non-compliance with lasix, exacerbated by hypertensive emergency/diastolic dysfunction.  He is s/p bronchoscopy 5/11, due to L lung opacity, and cultures have been negative to date.  Repeat bronch 5/14 unremarkable. Currently intubated, sedated. On propofol, fentanyl.  -management per CCM   # Acute on chronic Diastolic CHF - echo from 0000000 shows significant LVH, with grade 2 diastolic dysfunction, though with an EF of 55-60%. CXR shows evidence of pulmonary edema, and patient has bilateral pitting edema, consistent with volume overload. May have been worsened by hypertensive emergency.   -nephrology consulting -continue metoprolol with hold parameters -patient currently not on ACE-I due to CKD   # PEA Arrest - EMS notes PEA after respiratory distress and convulsion  activity, which reverted to sinus rhythm after approx 1 minute of CPR. This process was likely driven by acute respiratory failure  (see above). Troponin only mildly elevated to 0.31.  # AKI on CKD - Likely due to ATN, due to labile BP's during admission.  Cr continues to downtrend to 2.7 this morning, though this is still increased from patient's baseline of around 1.5-2, though with good UOP. -follow I/O's and Cr  -per nephrology  # Pericardial Effusion. Likely chronic, with interval increase in size from echo 02/2012. No evid for tamponade. ESR normal.  # Fever - Intermittent fevers may be due to PNA, concern for aspiration given patient's presentation.  -management per CCM, ceftriaxone  # Anemia - hemoglobin slowly downtrending this admission.  No evidence of active bleeding. -continue to follow CBC's  # Hypernatremia - Resolved.  Patient receiving D5 and free water per tube -per renal  Elnora Morrison, M.D. 09/26/2012, 6:23 AM   As above; patient seen and examined; patient sedated on vent; BP appears to be reasonable until he becomes agitated; continue IV lopressor (DC via tube); continue hydralazine. Will adjust regimen once he is extubated. Kirk Ruths 8:35 AM

## 2012-09-27 ENCOUNTER — Inpatient Hospital Stay (HOSPITAL_COMMUNITY): Payer: No Typology Code available for payment source

## 2012-09-27 LAB — GLUCOSE, CAPILLARY
Glucose-Capillary: 177 mg/dL — ABNORMAL HIGH (ref 70–99)
Glucose-Capillary: 155 mg/dL — ABNORMAL HIGH (ref 70–99)
Glucose-Capillary: 207 mg/dL — ABNORMAL HIGH (ref 70–99)
Glucose-Capillary: 217 mg/dL — ABNORMAL HIGH (ref 70–99)

## 2012-09-27 LAB — RENAL FUNCTION PANEL
CO2: 15 mEq/L — ABNORMAL LOW (ref 19–32)
Chloride: 112 mEq/L (ref 96–112)
GFR calc Af Amer: 29 mL/min — ABNORMAL LOW (ref >90)
GFR calc non Af Amer: 25 mL/min — ABNORMAL LOW (ref >90)
Sodium: 140 mEq/L (ref 135–145)

## 2012-09-27 LAB — CBC
Hemoglobin: 9 g/dL — ABNORMAL LOW (ref 13.0–17.0)
MCH: 26.5 pg (ref 26.0–34.0)
MCHC: 33 g/dL (ref 30.0–36.0)
Platelets: 190 10*3/uL (ref 150–400)

## 2012-09-27 MED ORDER — DARBEPOETIN ALFA-POLYSORBATE 100 MCG/0.5ML IJ SOLN
100.0000 ug | INTRAMUSCULAR | Status: DC
  Administered 2012-09-27: 100 ug via SUBCUTANEOUS
  Filled 2012-09-27: qty 0.5

## 2012-09-27 MED ORDER — POTASSIUM CHLORIDE 20 MEQ/15ML (10%) PO LIQD
40.0000 meq | ORAL | Status: AC
  Administered 2012-09-27 (×2): 40 meq
  Filled 2012-09-27 (×2): qty 30

## 2012-09-27 MED ORDER — POTASSIUM CHLORIDE 10 MEQ/50ML IV SOLN
10.0000 meq | INTRAVENOUS | Status: AC
  Administered 2012-09-27 (×4): 10 meq via INTRAVENOUS
  Filled 2012-09-27: qty 200

## 2012-09-27 MED ORDER — FUROSEMIDE 10 MG/ML IJ SOLN
120.0000 mg | Freq: Three times a day (TID) | INTRAMUSCULAR | Status: AC
  Administered 2012-09-27: 120 mg via INTRAVENOUS
  Filled 2012-09-27: qty 12

## 2012-09-27 NOTE — Progress Notes (Signed)
[facility] ICU Electrolyte Replacement Protocol  Patient Name: Corey [name] DOB: 1945-05-25 MRN: [id number]  Date of Service  09/27/2012 Pt K+ 2.8 Not Replaced. Pt does not fit ICU elctrolyte replacement protocol criteria.  MD notified  HPI/Events of Note    Recent Labs Lab 09/21/12 0355  09/23/12 0435 09/24/12 0500 09/25/12 0445 09/26/12 0400 09/27/12 0430  NA 142  < > 145 147*  148* 150* 141 140  K 4.0  < > 3.8 3.9  3.9 3.6 3.4* 2.8*  CL 109  < > 113* 114*  114* 118* 110 112  CO2 20  < > 19 21  20 21  17* 15*  GLUCOSE 113*  < > 99 175*  176* 149* 178* 166*  BUN 48*  < > 67* 69*  70* 73* 74* 75*  CREATININE 4.65*  < > 4.56* 3.53*  3.54* 3.09* 2.74* 2.53*  CALCIUM 9.1  < > 9.1 9.3  9.6 9.3 9.2 8.0*  MG 2.0  --   --   --   --   --   --   PHOS 3.4  --   --  4.9* 3.8  --  3.9  < > = values in this interval not displayed.  Estimated Creatinine Clearance: 33.2 ml/min (by C-G formula based on Cr of 2.53).  Intake/Output     05/15 0701 - 05/16 0700   I.V. (mL/kg) 1104.8 (10.7)   NG/GT 785   IV Piggyback 250   Total Intake(mL/kg) 2139.8 (20.7)   Urine (mL/kg/hr) 2050 (0.8)   Total Output 2050   Net +89.8        - I/O DETAILED x24h    Total I/O In: 684 [I.V.:459; NG/GT:225] Out: 1150 [Urine:1150] - I/O THIS SHIFT    ASSESSMENT   eICURN Interventions     ASSESSMENT: MAJOR ELECTROLYTE    Lorene Dy 09/27/2012, 5:35 AM

## 2012-09-27 NOTE — Progress Notes (Signed)
Pt placed back on full support at this time, RR inc 35 on C/PS trial, spo2 92% on 40% fio2, spoke with Dr.Feinstein, will not extubate at this time, pt tolerating full support well, will monitor

## 2012-09-27 NOTE — Progress Notes (Signed)
[facility] Progress Note Patient Name: Corey [name] DOB: 05-02-1946 MRN: [id number]  Date of Service  09/27/2012   HPI/Events of Note   hypokalemia  eICU Interventions  Potassium replaced   Intervention Category Intermediate Interventions: Electrolyte abnormality - evaluation and management  Harriet Sutphen 09/27/2012, 6:08 AM

## 2012-09-27 NOTE — Progress Notes (Signed)
PULMONARY  / CRITICAL CARE MEDICINE  Name: Corey [name] MRN: [id number] DOB: 1945/07/13    ADMISSION DATE:  09/19/2012 CONSULTATION DATE:  09/19/2012  REFERRING MD :  Christy Gentles PRIMARY SERVICE: PCCM  CHIEF COMPLAINT:  Acute respiratory failure  BRIEF PATIENT DESCRIPTION: 67 y/o male with LVH and diastolic dysfunction presented by EMS on 5/8 to the [facility] ED after he had the sudden onset of respiratory failure at home.  He had a seizure and brief cardiac arrest requiring <33min of CPR.    SIGNIFICANT EVENTS / STUDIES:  5/8 CT head-peripheral and tentorial calcifications, increased since 2009 MRI 5/8 Echo- EF 55-60% 5/9: medical renal disease. No hydro 5/11 bronchoscopy-pending culture 5/13- peep to 5 5/15- lasix, even balance ,poor weaning  LINES / TUBES: 5/8 ETT >> 5/8 peripheral IV >> 5/10 LIJ >> 5/10 Art Cath>>out  CULTURES: Blood 5/9>>>ng UC 5/9>>>ng Sputum 5/10: NF 5/14 bronch bal>>>  ANTIBIOTICS: vanc 5/9>>>5/12, 5/14>>>5/16 Zosyn 5/9 (fever)>>>5/12 Ceftriaxone 5/12>>>5/13 Ceftazidime 5/14>>>  SUBJECTIVE:  Awake, weaned 7.29, even balance  VITAL SIGNS: Temp:  [99.5 F (37.5 C)-100.9 F (38.3 C)] 99.9 F (37.7 C) (05/16 0645) Pulse Rate:  [60-91] 70 (05/16 0645) Resp:  [20-34] 22 (05/16 0645) BP: (108-209)/(58-99) 168/86 mmHg (05/16 0645) SpO2:  [92 %-97 %] 96 % (05/16 0645) FiO2 (%):  [40 %] 40 % (05/16 0415) Weight:  [227 lb 8.2 oz (103.2 kg)] 227 lb 8.2 oz (103.2 kg) (05/16 0433) HEMODYNAMICS:   VENTILATOR SETTINGS: Vent Mode:  [-] PRVC FiO2 (%):  [40 %] 40 % Set Rate:  [22 bmp] 22 bmp Vt Set:  [600 mL] 600 mL PEEP:  [5 cmH20] 5 cmH20 Pressure Support:  [5 cmH20] 5 cmH20 Plateau Pressure:  [29 cmH20-34 cmH20] 31 cmH20 INTAKE / OUTPUT: Intake/Output     05/15 0701 - 05/16 0700   I.V. (mL/kg) 1206.8 (11.7)   NG/GT 835   IV Piggyback 250   Total Intake(mL/kg) 2291.8 (22.2)   Urine (mL/kg/hr) 2225 (0.9)   Total Output 2225   Net +66.8         PHYSICAL EXAMINATION: Gen: watching tv HEENT: NCAT, PERRL PULM: low bs CV: RRR, distant heart sounds, no clear JVD AB: BS+, soft, nontender, no hsm Ext: warm, trace pretibial edema, no clubbing, no cyanosis, MAEs Derm: no rash or skin breakdown Neuro: sedated on vent, withdraws to pain  LABS:  Recent Labs Lab 09/25/12 0445 09/26/12 0400 09/27/12 0430  NA 150* 141 140  K 3.6 3.4* 2.8*  CL 118* 110 112  CO2 21 17* 15*  BUN 73* 74* 75*  CREATININE 3.09* 2.74* 2.53*  GLUCOSE 149* 178* 166*    Recent Labs Lab 09/24/12 0500 09/26/12 0400 09/27/12 0430  HGB 10.2* 9.6* 9.0*  HCT 31.1* 30.1* 27.3*  WBC 6.9 5.3 6.4  PLT 174 207 190   ABG    Component Value Date/Time   PHART 7.277* 09/26/2012 1044   PCO2ART 40.0 09/26/2012 1044   PO2ART 76.0* 09/26/2012 1044   HCO3 18.5* 09/26/2012 1044   TCO2 20 09/26/2012 1044   ACIDBASEDEF 8.0* 09/26/2012 1044   O2SAT 92.0 09/26/2012 1044  Triglycerides 123   Recent Labs Lab 09/21/12 0355 09/21/12 1051 09/22/12 0331 09/23/12 0435 09/24/12 0500 09/26/12 0400 09/27/12 0430  PROCALCITON  --  5.51 5.58 4.34  --   --   --   WBC 8.0  --  8.6 7.1 6.9 5.3 6.4    Recent Labs Lab 09/26/12 1526 09/26/12 1951 09/26/12 2005 09/26/12 2336 09/27/12  Pablo Pena   5/15- increased Left hazzines / atx?, mostly unchanged from yesterday  ASSESSMENT / PLAN:  PULMONARY A: Acute respiratory failure due to pulmonary edema Now w/ LLL worse vol loss involving almost entire left thorax. Possible mucous plugging. Requiring higher FIO2 and bronch 5/11 P:   -Increase lasix likely to neg balance -pcxr reviewed -wean again in upright position, cpap5 ps 5, goal 2 hrs  CARDIOVASCULAR A: Acute on chronic CHF presumably due to hypertensive emergency Pericardial effusion, moderate, no tamponade on echo 5/8 Hypertension, wife reports occasional medication non-compliance PEA arrest (in setting of decomp HF) Cards  following. He is now off NTG.  P:  -continue metoprolol and hydralazine (increase to 50 ) -may need addition clonidine, sys better this am   RENAL A:  Acute on Chronic renal failure, CKD, at baseline, Cr worsening BP up and down , prob ATN Hypernatremia-resolved P:   -better with lasix, put out 2.2 lit urine, concentrate input as able -lasix to 120 , until renal in am , may alter? Chem in am   GASTROINTESTINAL A:  No acute issues P:   - Protonix SUP - Continue TF, may hold for weaning  HEMATOLOGIC A: Anemia (due to CKD?), dilutional as well, increased hct P:  -continue [place] heparin Cbc in 48 hrs after lasixc will rise  INFECTIOUS A: possible aspiration PNA P:   Remains neg, dc vanc Maintain ceftaz  ENDOCRINE A:  DM2, on 70/30 35 units qHS at home P:   - Glargine 15, within goals NICE  NEUROLOGIC A:  Acute encephalopathy due to hypoxemia; improved mental status after intubation ?Seizure at home> currently mental status is OK, hypoxemia most likely explanation which has now resolved P:   - Fentanyl/propofol gtt review last TG, ok to continue -WUA  -chair position  I have fully examined this patient and agree with above findings.    And edited in full  Cm time 30 min   [name]. [name], MD, [place] Pgr: [facility]

## 2012-09-27 NOTE — Progress Notes (Signed)
Subjective:  The patient had a Tmax of 100.9 overnight.  BP somewhat more stable in the SBP 100-160's.  The patient's bicarb is starting to drop.  The patient appears more alert today, and is able to follow commands consistently.  Objective:  Vital Signs in the last 24 hours: Temp:  [99.5 F (37.5 C)-100.9 F (38.3 C)] 99.7 F (37.6 C) (05/16 0600) Pulse Rate:  [60-91] 62 (05/16 0600) Resp:  [20-34] 22 (05/16 0600) BP: (108-209)/(58-99) 160/84 mmHg (05/16 0600) SpO2:  [92 %-97 %] 97 % (05/16 0600) FiO2 (%):  [40 %] 40 % (05/16 0415) Weight:  [227 lb 8.2 oz (103.2 kg)] 227 lb 8.2 oz (103.2 kg) (05/16 0433)  Intake/Output from previous day: 05/15 0701 - 05/16 0700 In: 2291.8 [I.V.:1206.8; NG/GT:835; IV Piggyback:250] Out: 2225 [Urine:2225]  Physical Exam: General: lying in bed, intubated, sedated HEENT: pupils equal round and reactive to light, oropharynx clear and non-erythematous Neck: supple, no lymphadenopathy Lungs: Mechanical breath sounds, scattered ronchi Heart: regular rate and rhythm, no murmurs, gallops, or rubs Abdomen: Soft, non-tender, non-distended with normoactive bowel sounds. No hepatomegaly. No rebound/guarding. No obvious abdominal masses. Extremities: 2+ pitting edema bilaterally Neurologic: sedated, intubated, opens eyes to voice, follows commands  Lab Results:  Recent Labs  09/26/12 0400 09/27/12 0430  WBC 5.3 6.4  HGB 9.6* 9.0*  PLT 207 190    Recent Labs  09/26/12 0400 09/27/12 0430  NA 141 140  K 3.4* 2.8*  CL 110 112  CO2 17* 15*  GLUCOSE 178* 166*  BUN 74* 75*  CREATININE 2.74* 2.53*   No results found for this basename: TROPONINI, CK, MB,  in the last 72 hours  Cardiac Studies: Echocardiogram 09/19/12 - Left ventricle: The cavity size was normal. Wall thickness was increased in a pattern of severe LVH. Systolic function was normal. The estimated ejection fraction was in the range of 55% to 60%. - Pericardium, extracardiac: A  moderate, free-flowing pericardial effusion was identified circumferential to the heart. There was no evidence of hemodynamic compromise.  Tele: NSR, few PVC's  Assessment/Plan:  The patient is a 67 yo M, history of hypertrophic cardiomyopathy, presents after PEA arrest with hypertensive emergency.   # Hypertensive emergency - the patient presents with initial BP 216/165, with altered mental status, pulmonary edema. The patient's BP decreased soon after admission with sedation alone, then has remained labile since admission despite only small doses of anti-hypertensives.  Given PEA arrest, labile BP's may be a sign of central dysregulation, perhaps suggesting worse neurologic status than previously suspected. -continue metoprolol with hold parameters for SBP < 160 -continue hydralazine   # Acute Respiratory Failure/VDRF - The patient presented with acute respiratory failure, with evidence of pulmonary edema on CXR. The etiology for this event was likely pulmonary edema due to non-compliance with lasix, exacerbated by hypertensive emergency/diastolic dysfunction.  He is s/p bronchoscopy 5/11, due to L lung opacity, and cultures have been negative to date.  Repeat bronch 5/14 unremarkable. Currently intubated, sedated. On propofol, fentanyl.  -management per CCM   # Acute on chronic Diastolic CHF - echo from 0000000 shows significant LVH, with grade 2 diastolic dysfunction, though with an EF of 55-60%. CXR shows evidence of pulmonary edema, and patient has bilateral pitting edema, consistent with volume overload. May have been worsened by hypertensive emergency.   -nephrology consulting -continue metoprolol with hold parameters -patient currently not on ACE-I due to CKD   # PEA Arrest - EMS notes PEA after respiratory distress  and convulsion activity, which reverted to sinus rhythm after approx 1 minute of CPR. This process was likely driven by acute respiratory failure (see above). Troponin only  mildly elevated to 0.31.  # AKI on CKD - Likely due to ATN, due to labile BP's during admission.  Cr continues to downtrend to 2.5 this morning, though this is still increased from patient's baseline of around 1.5-2, though with good UOP.  The patient's bicarb has dropped today to 15, and ABG confirms acidosis, likely secondary to uremia. -follow I/O's and Cr  -per nephrology -?consider addition of PO bicarb  # Pericardial Effusion. Likely chronic, with interval increase in size from echo 02/2012. No evid for tamponade. ESR normal.  # Fever - Intermittent fevers may be due to PNA, concern for aspiration given patient's presentation.  -management per CCM, ceftriaxone  # Anemia - hemoglobin slowly downtrending this admission.  No evidence of active bleeding. -continue to follow CBC's  # Hypernatremia - Resolved.  Patient receiving D5 and free water per tube -per renal  # Hyperkalemia - repleted  Elnora Morrison, M.D. 09/27/2012, 6:14 AM  Patient seen with resident, agree with the above note.  Stable this morning, remains intubated.  BP stable.  Diuretics per renal.  Respiratory failure per CCM.   Loralie Champagne 09/27/2012 8:29 AM

## 2012-09-27 NOTE — Progress Notes (Signed)
Subjective:  Low grade temp overnight.  BP remains up and down but actually less so  good UOP overnight, over 2 liters  Creatinine decreased/BUN stable.  Sodium better  Objective Vital signs in last 24 hours: Filed Vitals:   09/27/12 0600 09/27/12 0630 09/27/12 0645 09/27/12 0700  BP: 160/84 179/84 168/86 147/80  Pulse: 62 67 70 70  Temp: 99.7 F (37.6 C) 99.7 F (37.6 C) 99.9 F (37.7 C) 99.9 F (37.7 C)  TempSrc:      Resp: 22 22 22 22   Height:      Weight:      SpO2: 97% 97% 96% 96%   Weight change: -1.5 kg (-3 lb 4.9 oz)  Intake/Output Summary (Last 24 hours) at 09/27/12 0755 Last data filed at 09/27/12 [id number]  Gross per 24 hour  Intake 2367.76 ml  Output   2625 ml  Net -257.24 ml   Labs: Basic Metabolic Panel:  Recent Labs Lab 09/24/12 0500 09/25/12 0445 09/26/12 0400 09/27/12 0430  NA 147*  148* 150* 141 140  K 3.9  3.9 3.6 3.4* 2.8*  CL 114*  114* 118* 110 112  CO2 21  20 21  17* 15*  GLUCOSE 175*  176* 149* 178* 166*  BUN 69*  70* 73* 74* 75*  CREATININE 3.53*  3.54* 3.09* 2.74* 2.53*  CALCIUM 9.3  9.6 9.3 9.2 8.0*  PHOS 4.9* 3.8  --  3.9   Liver Function Tests:  Recent Labs Lab 09/22/12 0331 09/23/12 0435 09/24/12 0500 09/25/12 0445 09/27/12 0430  AST 53* 31 95*  --   --   ALT 49 35 59*  --   --   ALKPHOS 50 53 95  --   --   BILITOT 0.9 0.8 1.2  --   --   PROT 6.5 6.1 6.7  --   --   ALBUMIN 2.9* 2.5* 2.4*  2.4* 2.1* 1.8*    Recent Labs Lab 09/21/12 1130  LIPASE 13   No results found for this basename: AMMONIA,  in the last 168 hours CBC:  Recent Labs Lab 09/22/12 0331 09/23/12 0435 09/24/12 0500 09/26/12 0400 09/27/12 0430  WBC 8.6 7.1 6.9 5.3 6.4  NEUTROABS  --   --  5.0 3.6  --   HGB 10.9* 9.8* 10.2* 9.6* 9.0*  HCT 32.3* 29.5* 31.1* 30.1* 27.3*  MCV 79.6 79.1 80.4 81.6 80.3  PLT 143* 155 174 207 190   Cardiac Enzymes: No results found for this basename: CKTOTAL, CKMB, CKMBINDEX, TROPONINI,  in the last 168  hours CBG:  Recent Labs Lab 09/26/12 1526 09/26/12 1951 09/26/12 2005 09/26/12 2336 09/27/12 0440  GLUCAP 176* 171* 167* 166* 177*    Iron Studies: No results found for this basename: IRON, TIBC, TRANSFERRIN, FERRITIN,  in the last 72 hours Studies/Results: Dg Chest Port 1 View  09/27/2012   *RADIOLOGY REPORT*  Clinical Data: Endotracheal tube position  PORTABLE CHEST - 1 VIEW  Comparison: 09/26/2012  Findings: Endotracheal tube remains in good position.  Central venous catheter tip remains in the SVC.  NG tube enters the stomach.  Cardiac enlargement without pulmonary edema.  Moderate left lower lobe atelectasis/infiltrate and left effusion are unchanged.  Mild improvement in aeration of the right lower lobe.  IMPRESSION: Support lines remain in good position.  No change in left lower lobe consolidation.  Mild improvement in right lower lobe airspace disease.   Original Report Authenticated By: Carl Best, M.D.   Dg Chest Port 1 View  09/26/2012   *  RADIOLOGY REPORT*  Clinical Data: Assess collapse.  PORTABLE CHEST - 1 VIEW  Comparison: 09/25/2012  Findings: Endotracheal tube is 4.1 cm above the carina.  Jugular central line is near the upper SVC.  Nasogastric tube extends into the abdomen.  Again noted are markedly low lung volumes.  There is persistent consolidation or opacification at the left lung base. Heart size remains enlarged.  IMPRESSION: Persistent low lung volumes with left basilar densities.  Minimal change from the previous examination.  Cardiomegaly.   Original Report Authenticated By: Markus Daft, M.D.   Medications: Infusions: . sodium chloride 10 mL/hr at 09/25/12 2030  . dextrose 20 mL/hr at 09/26/12 1000  . fentaNYL infusion INTRAVENOUS 150 mcg/hr (09/26/12 1955)  . propofol 10 mcg/kg/min (09/26/12 1700)    Scheduled Medications: . amLODipine  10 mg Oral Daily  . antiseptic oral rinse  15 mL Mouth Rinse QID  . cefTAZidime (FORTAZ)  IV  1 g Intravenous Q24H  .  chlorhexidine  15 mL Mouth Rinse BID  . feeding supplement (OSMOLITE 1.5 CAL)  1,000 mL Per Tube Q24H  . feeding supplement  60 mL Per Tube TID  . furosemide  120 mg Intravenous Q8H  . heparin subcutaneous  5,000 Units Subcutaneous Q8H  . hydrALAZINE  50 mg Oral Q8H  . insulin aspart  2-6 Units Subcutaneous Q4H  . insulin glargine  15 Units Subcutaneous QHS  . metoprolol  2.5 mg Intravenous Q6H  . pantoprazole sodium  40 mg Per Tube QHS  . potassium chloride  10 mEq Intravenous Q1 Hr x 4  . potassium chloride  40 mEq Per Tube Q4H    have reviewed scheduled and prn medications.  Physical Exam: General: sedated but arousable on the vent Heart: RRR Lungs: CBS bilat Abdomen: soft non tender, non distended Extremities: minimal edema   Assessment/ Plan: Pt is a 67 y.o. yo male who was admitted on 09/19/2012 with respiratory failure/cardiac arrest- variable BP and probable pneumonia  Assessment/Plan: 1. A on CRF- documented baseline CKD with creatinine between 1.5 and 2- now with A on CRF in the setting of respiratory failure/ arrest/variable BP.  Nonoliguric on  Lasix now - no indications for HD- will continue to follow UOP, and labs.  Hopefully improving 2. HTN/volume- BP still variable but better- wonder if some of BP spikes has to do with agitation?  Now norvasc has been added at full dose and hydralazine scheduled- hope this doesn't take BP too low.  Diuresing now as well and is OK. 3. Anemia- dropping slowly with hosp- will continue to follow.  Will give one dose aranesp today 4. ID- per CCM-probable PNA with white out- s/p bronch-  Now fortaz and vanc with better temp 5. Vent management- per CCM- I would hope once off the vent can get BP better stabilized 6. Hypokalemia- being replaced per CCM.    Corey [name] A   09/27/2012,7:55 AM  LOS: 8 days

## 2012-09-28 ENCOUNTER — Inpatient Hospital Stay (HOSPITAL_COMMUNITY): Payer: No Typology Code available for payment source

## 2012-09-28 LAB — GLUCOSE, CAPILLARY
Glucose-Capillary: 167 mg/dL — ABNORMAL HIGH (ref 70–99)
Glucose-Capillary: 158 mg/dL — ABNORMAL HIGH (ref 70–99)
Glucose-Capillary: 191 mg/dL — ABNORMAL HIGH (ref 70–99)

## 2012-09-28 LAB — TRIGLYCERIDES: Triglycerides: 174 mg/dL — ABNORMAL HIGH (ref <150)

## 2012-09-28 LAB — BASIC METABOLIC PANEL
Calcium: 9.8 mg/dL (ref 8.4–10.5)
GFR calc Af Amer: 23 mL/min — ABNORMAL LOW (ref >90)
GFR calc non Af Amer: 20 mL/min — ABNORMAL LOW (ref >90)
Glucose, Bld: 183 mg/dL — ABNORMAL HIGH (ref 70–99)
Potassium: 3.9 mEq/L (ref 3.5–5.1)
Sodium: 142 mEq/L (ref 135–145)

## 2012-09-28 MED ORDER — AMLODIPINE BESYLATE 5 MG PO TABS
5.0000 mg | ORAL_TABLET | Freq: Every day | ORAL | Status: DC
  Administered 2012-09-28: 5 mg via ORAL
  Filled 2012-09-28 (×2): qty 1

## 2012-09-28 MED ORDER — SODIUM CHLORIDE 0.9 % IV SOLN
INTRAVENOUS | Status: DC
  Administered 2012-09-30: 04:00:00 via INTRAVENOUS

## 2012-09-28 MED ORDER — FENTANYL CITRATE 0.05 MG/ML IJ SOLN
25.0000 ug | INTRAMUSCULAR | Status: DC | PRN
  Administered 2012-10-02 (×2): 50 ug via INTRAVENOUS
  Filled 2012-09-28 (×2): qty 2

## 2012-09-28 MED ORDER — DEXMEDETOMIDINE HCL IN NACL 200 MCG/50ML IV SOLN
0.2000 ug/kg/h | INTRAVENOUS | Status: DC
Start: 2012-09-28 — End: 2012-09-30
  Administered 2012-09-28: 0.7 ug/kg/h via INTRAVENOUS
  Administered 2012-09-28: 0.6 ug/kg/h via INTRAVENOUS
  Administered 2012-09-28: 0.8 ug/kg/h via INTRAVENOUS
  Administered 2012-09-28: 0.4 ug/kg/h via INTRAVENOUS
  Administered 2012-09-29 – 2012-09-30 (×15): 0.9 ug/kg/h via INTRAVENOUS
  Filled 2012-09-28: qty 50
  Filled 2012-09-28: qty 100
  Filled 2012-09-28: qty 50
  Filled 2012-09-28: qty 100
  Filled 2012-09-28: qty 50
  Filled 2012-09-28: qty 100
  Filled 2012-09-28 (×4): qty 50
  Filled 2012-09-28: qty 100
  Filled 2012-09-28 (×3): qty 50
  Filled 2012-09-28: qty 100

## 2012-09-28 MED ORDER — MIDAZOLAM HCL 2 MG/2ML IJ SOLN
2.0000 mg | INTRAMUSCULAR | Status: DC | PRN
  Administered 2012-09-28 – 2012-09-30 (×7): 2 mg via INTRAVENOUS
  Administered 2012-10-01 – 2012-10-02 (×2): 4 mg via INTRAVENOUS
  Filled 2012-09-28 (×8): qty 2
  Filled 2012-09-28 (×2): qty 4

## 2012-09-28 NOTE — Progress Notes (Signed)
ANTIBIOTIC CONSULT NOTE - INITIAL  Pharmacy Consult for Ceftazidime Indication: r/o HCAP  Allergies  Allergen Reactions  . Hydrochlorothiazide     Gout , uncontrolled diabetes and renal insufficiency  . Allopurinol     Renal insufficiency    Patient Measurements: Height: 5\' 6"  (167.6 cm) Weight: 226 lb 10.1 oz (102.8 kg) IBW/kg (Calculated) : 63.8  Vital Signs: Temp: 99.9 F (37.7 C) (05/17 0400) Temp src: Oral (05/17 0400) BP: 169/85 mmHg (05/17 0700) Pulse Rate: 70 (05/17 0700) Intake/Output from previous day: 05/16 0701 - 05/17 0700 In: [id number] [I.V.:818; NG/GT:575; IV Piggyback:312] Out: [id number] [Urine:2990; Stool:950] Intake/Output from this shift:    Labs:  Recent Labs  09/26/12 0400 09/27/12 0430 09/28/12 0349  WBC 5.3 6.4  --   HGB 9.6* 9.0*  --   PLT 207 190  --   CREATININE 2.74* 2.53* 3.13*   Estimated Creatinine Clearance: 26.8 ml/min (by C-G formula based on Cr of 3.13).   Microbiology: 5/9 BC x 2 >> negative final 5/9 Urine>> negative final 5/10 trach>> oral flora  Antibiotics this admission: Ceftazidime 5/14 >> Vancomycin 5/9>>5/12; restart 5/14>>5/16 Zosyn 5/9>>5/12 Rocephin 5/12 >> 5/14  Assessment: 67 y.o. M with antibiotics de-escalated to Rocephin monotherapy on 5/12 for r/o PNA/sepsis, broadened antibiotic coverage back to Vancomycin + Ceftazidime in the setting of fevers and worsening lung collapse 5/14-5/16, vancomycin d/c'd 5/16, patient continues on ceftazidime per Pharmacy for suspected HCAP. Today is D#4 ceftazidime, cultures have been non-revealing  Patient remains sedated with fentanyl and propofol and on a ventilator.  Tmax/24h: 100.3, WBC wnl, SCr increased from yesterday 2.53 > 3.13, CrCl~20-30 ml/min, UOP/24h: 1.2 ml/kg/hr (Lasix d/c'd yesterday).    Goal of Therapy:  Eradication of infection  Plan:  1. Continue ceftazidime 1g IV every 24 hours 2. Will continue to follow renal function, culture results, LOT, and  antibiotic de-escalation plans     Thank you for the consult.  Johny Drilling, PharmD Clinical Pharmacist Pager: [phone] Pharmacy: [phone] 09/28/2012 7:38 AM

## 2012-09-28 NOTE — Progress Notes (Signed)
Subjective:  Low grade temp overnight continues- alert but agitated on vent.  BP remains up and down-good UOP overnight, over 2 liters, with stool had 4 liters out  Today BUN and creatinine both up  Objective Vital signs in last 24 hours: Filed Vitals:   09/28/12 0500 09/28/12 0600 09/28/12 0700 09/28/12 0736  BP: 172/79 131/69 169/85 169/85  Pulse: 80 65 70 88  Temp:      TempSrc:      Resp: 16 22 22 22   Height:      Weight:      SpO2: 95% 97% 98% 95%   Weight change: -0.4 kg (-14.1 oz)  Intake/Output Summary (Last 24 hours) at 09/28/12 0753 Last data filed at 09/28/12 0600  Gross per 24 hour  Intake   1655 ml  Output   3540 ml  Net  -1885 ml   Labs: Basic Metabolic Panel:  Recent Labs Lab 09/24/12 0500 09/25/12 0445 09/26/12 0400 09/27/12 0430 09/28/12 0349  NA 147*  148* 150* 141 140 142  K 3.9  3.9 3.6 3.4* 2.8* 3.9  CL 114*  114* 118* 110 112 111  CO2 21  20 21  17* 15* 21  GLUCOSE 175*  176* 149* 178* 166* 183*  BUN 69*  70* 73* 74* 75* 90*  CREATININE 3.53*  3.54* 3.09* 2.74* 2.53* 3.13*  CALCIUM 9.3  9.6 9.3 9.2 8.0* 9.8  PHOS 4.9* 3.8  --  3.9  --    Liver Function Tests:  Recent Labs Lab 09/22/12 0331 09/23/12 0435 09/24/12 0500 09/25/12 0445 09/27/12 0430  AST 53* 31 95*  --   --   ALT 49 35 59*  --   --   ALKPHOS 50 53 95  --   --   BILITOT 0.9 0.8 1.2  --   --   PROT 6.5 6.1 6.7  --   --   ALBUMIN 2.9* 2.5* 2.4*  2.4* 2.1* 1.8*    Recent Labs Lab 09/21/12 1130  LIPASE 13   No results found for this basename: AMMONIA,  in the last 168 hours CBC:  Recent Labs Lab 09/22/12 0331 09/23/12 0435 09/24/12 0500 09/26/12 0400 09/27/12 0430  WBC 8.6 7.1 6.9 5.3 6.4  NEUTROABS  --   --  5.0 3.6  --   HGB 10.9* 9.8* 10.2* 9.6* 9.0*  HCT 32.3* 29.5* 31.1* 30.1* 27.3*  MCV 79.6 79.1 80.4 81.6 80.3  PLT 143* 155 174 207 190   Cardiac Enzymes: No results found for this basename: CKTOTAL, CKMB, CKMBINDEX, TROPONINI,  in the last  168 hours CBG:  Recent Labs Lab 09/27/12 1140 09/27/12 1523 09/27/12 1946 09/28/12 0031 09/28/12 0340  GLUCAP 207* 217* 195* 220* 167*    Iron Studies: No results found for this basename: IRON, TIBC, TRANSFERRIN, FERRITIN,  in the last 72 hours Studies/Results: Dg Chest Port 1 View  09/27/2012   *RADIOLOGY REPORT*  Clinical Data: Endotracheal tube position  PORTABLE CHEST - 1 VIEW  Comparison: 09/26/2012  Findings: Endotracheal tube remains in good position.  Central venous catheter tip remains in the SVC.  NG tube enters the stomach.  Cardiac enlargement without pulmonary edema.  Moderate left lower lobe atelectasis/infiltrate and left effusion are unchanged.  Mild improvement in aeration of the right lower lobe.  IMPRESSION: Support lines remain in good position.  No change in left lower lobe consolidation.  Mild improvement in right lower lobe airspace disease.   Original Report Authenticated By: Carl Best, M.D.  Medications: Infusions: . sodium chloride 10 mL/hr at 09/28/12 0600  . dextrose 10 mL/hr at 09/28/12 0600  . fentaNYL infusion INTRAVENOUS 125 mcg/hr (09/28/12 0600)  . propofol 10 mcg/kg/min (09/28/12 [id number])    Scheduled Medications: . amLODipine  5 mg Oral Daily  . antiseptic oral rinse  15 mL Mouth Rinse QID  . cefTAZidime (FORTAZ)  IV  1 g Intravenous Q24H  . chlorhexidine  15 mL Mouth Rinse BID  . darbepoetin (ARANESP) injection - NON-DIALYSIS  100 mcg Subcutaneous Q Fri-1800  . feeding supplement (OSMOLITE 1.5 CAL)  1,000 mL Per Tube Q24H  . feeding supplement  60 mL Per Tube TID  . heparin subcutaneous  5,000 Units Subcutaneous Q8H  . hydrALAZINE  50 mg Oral Q8H  . insulin aspart  2-6 Units Subcutaneous Q4H  . insulin glargine  15 Units Subcutaneous QHS  . metoprolol  2.5 mg Intravenous Q6H  . pantoprazole sodium  40 mg Per Tube QHS    have reviewed scheduled and prn medications.  Physical Exam: General: alert and agitated on vent, no commands Heart:  RRR Lungs: CBS bilat Abdomen: soft non tender, non distended Extremities: minimal edema   Assessment/ Plan: Pt is a 67 y.o. yo male who was admitted on 09/19/2012 with respiratory failure/cardiac arrest- variable BP and probable pneumonia  Assessment/Plan: 1. A on CRF- documented baseline CKD with creatinine between 1.5 and 2- now with A on CRF in the setting of respiratory failure/ arrest/variable BP.  Nonoliguric previously on lasix  - numbers worsened today, did have SBP of 100 ?cause or maybe too neg of fluid balance? It appears that lasix has been stopped and I have decreased norvasc and given parameters on all BP meds to avoid further lows.  Still with UOP- no absolute indications for HD but BUN of 90 probably not helping extubation effort.   2. HTN/volume- BP still variable but better- wonder if some of BP spikes has to do with agitation?  See above for modifications of BP meds. 3. Anemia- dropping slowly with hosp- will continue to follow.  S/p  one dose aranesp  4. ID- per CCM-probable PNA with white out- s/p bronch-  Now fortaz and vanc with better temp 5. Vent management- per CCM- I would hope once off the vent can get BP better stabilized 6. Hypokalemia- resolved 7. Hyponatremia- resolved   Xitlally Mooneyham A   09/28/2012,7:53 AM  LOS: 9 days

## 2012-09-28 NOTE — Progress Notes (Signed)
236ml fentanyl wasted in sink with RN witnessed X 2

## 2012-09-28 NOTE — Progress Notes (Signed)
PULMONARY  / CRITICAL CARE MEDICINE  Name: Corey [name] MRN: [id number] DOB: 1946/05/11    ADMISSION DATE:  09/19/2012 CONSULTATION DATE:  09/19/2012  REFERRING MD :  Christy Gentles PRIMARY SERVICE: PCCM  CHIEF COMPLAINT:  Acute respiratory failure  BRIEF PATIENT DESCRIPTION: 67 y/o male with LVH and diastolic dysfunction presented by EMS on 5/8 to the [facility] ED after he had the sudden onset of respiratory failure at home.  He had a seizure and brief cardiac arrest requiring <33min of CPR.    SIGNIFICANT EVENTS / STUDIES:  5/8 CT head-peripheral and tentorial calcifications, increased since 2009 MRI 5/8 Echo- EF 55-60% 5/9: medical renal disease. No hydro 5/11 bronchoscopy-pending culture 5/13- peep to 5 5/15- lasix, even balance ,poor weaning 5/16- neg balance lasix  LINES / TUBES: 5/8 ETT >> 5/8 peripheral IV >> 5/10 LIJ >> 5/10 Art Cath>>out  CULTURES: Blood 5/9>>>ng UC 5/9>>>ng Sputum 5/10: NF 5/14 bronch bal>>>  ANTIBIOTICS: vanc 5/9>>>5/12, 5/14>>>5/16 Zosyn 5/9 (fever)>>>5/12 Ceftriaxone 5/12>>>5/13 Ceftazidime 5/14>>>  SUBJECTIVE:  Neg balance  VITAL SIGNS: Temp:  [99.5 F (37.5 C)-100.3 F (37.9 C)] 99.8 F (37.7 C) (05/17 0758) Pulse Rate:  [65-97] 81 (05/17 0800) Resp:  [16-25] 22 (05/17 0800) BP: (100-236)/(60-97) 151/72 mmHg (05/17 0800) SpO2:  [94 %-98 %] 96 % (05/17 0800) FiO2 (%):  [40 %] 40 % (05/17 0800) Weight:  [226 lb 10.1 oz (102.8 kg)] 226 lb 10.1 oz (102.8 kg) (05/17 0400) HEMODYNAMICS:   VENTILATOR SETTINGS: Vent Mode:  [-] PRVC FiO2 (%):  [40 %] 40 % Set Rate:  [22 bmp] 22 bmp Vt Set:  [600 mL] 600 mL PEEP:  [5 cmH20] 5 cmH20 Plateau Pressure:  [18 cmH20-26 cmH20] 21 cmH20 INTAKE / OUTPUT: Intake/Output     05/16 0701 - 05/17 0700 05/17 0701 - 05/18 0700   I.V. (mL/kg) 818 (8) 77 (0.7)   NG/GT 575 50   IV Piggyback 312    Total Intake(mL/kg) 1705 (16.6) 127 (1.2)   Urine (mL/kg/hr) 2990 (1.2) 275 (1.4)   Stool 950 (0.4)    Total  Output 3940 275   Net -2235 -148         PHYSICAL EXAMINATION: Gen: awake HEENT: NCAT, PERRL PULM: clear apical CV: RRR, distant heart sounds, no clear JVD AB: BS+, soft, nontender, no hsm Ext: warm, trace pretibial edema reduced Derm: no rash or skin breakdown Neuro: rass 0   LABS:  Recent Labs Lab 09/26/12 0400 09/27/12 0430 09/28/12 0349  NA 141 140 142  K 3.4* 2.8* 3.9  CL 110 112 111  CO2 17* 15* 21  BUN 74* 75* 90*  CREATININE 2.74* 2.53* 3.13*  GLUCOSE 178* 166* 183*    Recent Labs Lab 09/24/12 0500 09/26/12 0400 09/27/12 0430  HGB 10.2* 9.6* 9.0*  HCT 31.1* 30.1* 27.3*  WBC 6.9 5.3 6.4  PLT 174 207 190   ABG    Component Value Date/Time   PHART 7.277* 09/26/2012 1044   PCO2ART 40.0 09/26/2012 1044   PO2ART 76.0* 09/26/2012 1044   HCO3 18.5* 09/26/2012 1044   TCO2 20 09/26/2012 1044   ACIDBASEDEF 8.0* 09/26/2012 1044   O2SAT 92.0 09/26/2012 1044  Triglycerides 123   Recent Labs Lab 09/21/12 1051  09/22/12 0331 09/23/12 0435 09/24/12 0500 09/26/12 0400 09/27/12 0430  PROCALCITON 5.51  --  5.58 4.34  --   --   --   WBC  --   < > 8.6 7.1 6.9 5.3 6.4  < > = values in  this interval not displayed.  Recent Labs Lab 09/27/12 1140 09/27/12 1523 09/27/12 1946 09/28/12 0031 09/28/12 0340  GLUCAP 207* 217* 195* 220* 167*   5/17- improved int edema  ASSESSMENT / PLAN:  PULMONARY A: Acute respiratory failure due to pulmonary edema Now w/ LLL worse vol loss involving almost entire left thorax. Possible mucous plugging. Requiring higher FIO2 and bronch 5/11 P:   -dc lasix, agree, benefited, see renal -pcxr in am -wean again in upright position, cpap5 ps 5, goal 2 hrs, if fails, increase PS -I wonder if he will need a trach  CARDIOVASCULAR A: Acute on chronic CHF presumably due to hypertensive emergency Pericardial effusion, moderate, no tamponade on echo 5/8 Hypertension, wife reports occasional medication non-compliance PEA arrest (in  setting of decomp HF) Cards following. Lower BP noted 5/16 P:  -continue metoprolol and hydralazine (increase to 50 ) Norvasc reduced Afterload controlled  RENAL A:  Acute on Chronic renal failure, CKD, at baseline, Cr worsening BP up and down , prob ATN Hypernatremia-resolved wrosen crt with neg balance P:   -dc all diuretics Chem in am   GASTROINTESTINAL A:  No acute issues P:   - Protonix SUP - Continue TF as failed weaning  HEMATOLOGIC A: Anemia (due to CKD?), dilutional as well, increased hct P:  -continue [place] heparin  INFECTIOUS A: Possible aspiration PNA P:   Maintain ceftaz, will consider empiric course  ENDOCRINE A:  DM2, on 70/30 35 units qHS at home P:   - Glargine 15, within goals NICE Dc d10  NEUROLOGIC A:  Acute encephalopathy due to hypoxemia; improved mental status after intubation ?Seizure at home> currently mental status is OK, hypoxemia most likely explanation which has now resolved P:   -WUA  -chair position Dc propofol Add precedex if worsen  I have fully examined this patient and agree with above findings.    And edited in full  Cm time 30 min   [name]. [name], MD, [place] Pgr: [facility]

## 2012-09-28 NOTE — Progress Notes (Signed)
Patient Name: Corey [name]      SUBJECTIVE: Hypertrophic cardiomyopathy (??)admitted 5/8 with respiratory failure and hypertensive emergency That same day he ended up with a PEA arrest. Course complicated by fever and renal failure with gradual improvement  Past Medical History  Diagnosis Date  . Renal insufficiency   . Diabetes mellitus   . Hypertension   . Gout   . Colon polyp 2003    Dr [name]; F/U was to be 2008( not completed)  . Diverticulosis 2003  . Pneumonia 09/29/11    [facility] X 10 daysas OP    PHYSICAL EXAM Filed Vitals:   09/28/12 0600 09/28/12 0700 09/28/12 0736 09/28/12 0758  BP: 131/69 169/85 169/85   Pulse: 65 70 88   Temp:    99.8 F (37.7 C)  TempSrc:    Oral  Resp: 22 22 22    Height:      Weight:      SpO2: 97% 98% 95%    Well developed and nourished intubated HENT normal Neck supple with JVP-flat Clear Regular rate and rhythm, no murmurs or gallops Abd-soft with active BS No Clubbing cyanosis edema Skin-warm and dry A & Oriented  Grossly normal sensory and motor function  Temp (24hrs), Avg:99.7 F (37.6 C), Min:99.5 F (37.5 C), Max:100.3 F (37.9 C)     Intake/Output Summary (Last 24 hours) at 09/28/12 0829 Last data filed at 09/28/12 0600  Gross per 24 hour  Intake   1539 ml  Output   3540 ml  Net  -2001 ml    LABS: Basic Metabolic Panel:  Recent Labs Lab 09/22/12 0331 09/23/12 0435  09/24/12 0500 09/25/12 0445 09/26/12 0400 09/27/12 0430 09/28/12 0349  NA 144 145  --  147*  148* 150* 141 140 142  K 4.0 3.8  --  3.9  3.9 3.6 3.4* 2.8* 3.9  CL 110 113*  --  114*  114* 118* 110 112 111  CO2 20 19  --  21  20 21  17* 15* 21  GLUCOSE 113* 99  --  175*  176* 149* 178* 166* 183*  BUN 60* 67*  --  69*  70* 73* 74* 75* 90*  CREATININE 4.91* 4.56*  --  3.53*  3.54* 3.09* 2.74* 2.53* 3.13*  CALCIUM 9.0 9.1  --  9.3  9.6 9.3 9.2 8.0* 9.8  PHOS  --   --   < > 4.9* 3.8  --  3.9  --   < > = values in this interval not  displayed. Cardiac Enzymes: No results found for this basename: CKTOTAL, CKMB, CKMBINDEX, TROPONINI,  in the last 72 hours CBC:  Recent Labs Lab 09/22/12 0331 09/23/12 0435 09/24/12 0500 09/26/12 0400 09/27/12 0430  WBC 8.6 7.1 6.9 5.3 6.4  NEUTROABS  --   --  5.0 3.6  --   HGB 10.9* 9.8* 10.2* 9.6* 9.0*  HCT 32.3* 29.5* 31.1* 30.1* 27.3*  MCV 79.6 79.1 80.4 81.6 80.3  PLT 143* 155 174 207 190   PROTIME: No results found for this basename: LABPROT, INR,  in the last 72 hours Liver Function Tests:  Recent Labs  09/27/12 0430  ALBUMIN 1.8*   No results found for this basename: LIPASE, AMYLASE,  in the last 72 hours BNP: BNP (last 3 results)  Recent Labs  02/19/12 1703 09/19/12 0432 09/23/12 1200  PROBNP 416.0* 2818.0* 1211.0*   D-Dimer: No results found for this basename: DDIMER,  in the last 72 hours Hemoglobin A1C: No  results found for this basename: HGBA1C,  in the last 72 hours Fasting Lipid Panel:  Recent Labs  09/28/12 0349  TRIG 174*    ASSESSMENT AND PLAN:  Active Problems:   Acute pulmonary edema   Cardiac arrest   Atelectasis  Overall improving will continue current medications Target heart rate and volume and blood pressure  will be our most effective cardiac contributions for his respiratory/heart failure with HFpEF however, with renal function worsening  we will defer hemodynamics to nephrology Signed, Virl Axe MD  09/28/2012

## 2012-09-29 LAB — GLUCOSE, CAPILLARY
Glucose-Capillary: 203 mg/dL — ABNORMAL HIGH (ref 70–99)
Glucose-Capillary: 198 mg/dL — ABNORMAL HIGH (ref 70–99)
Glucose-Capillary: 146 mg/dL — ABNORMAL HIGH (ref 70–99)
Glucose-Capillary: 148 mg/dL — ABNORMAL HIGH (ref 70–99)

## 2012-09-29 LAB — RENAL FUNCTION PANEL
Albumin: 2.5 g/dL — ABNORMAL LOW (ref 3.5–5.2)
BUN: 89 mg/dL — ABNORMAL HIGH (ref 6–23)
Chloride: 116 mEq/L — ABNORMAL HIGH (ref 96–112)
GFR calc Af Amer: 27 mL/min — ABNORMAL LOW (ref >90)
Phosphorus: 4.1 mg/dL (ref 2.3–4.6)
Potassium: 3.9 mEq/L (ref 3.5–5.1)
Sodium: 148 mEq/L — ABNORMAL HIGH (ref 135–145)

## 2012-09-29 MED ORDER — SODIUM CHLORIDE 0.9 % IV SOLN
INTRAVENOUS | Status: DC
  Administered 2012-09-29: 1.7 [IU]/h via INTRAVENOUS
  Filled 2012-09-29: qty 1

## 2012-09-29 MED ORDER — VECURONIUM BROMIDE 10 MG IV SOLR
10.0000 mg | Freq: Once | INTRAVENOUS | Status: AC
  Administered 2012-09-30: 10 mg via INTRAVENOUS
  Filled 2012-09-29: qty 10

## 2012-09-29 MED ORDER — FENTANYL CITRATE 0.05 MG/ML IJ SOLN
200.0000 ug | Freq: Once | INTRAMUSCULAR | Status: AC
  Administered 2012-09-30: 200 ug via INTRAVENOUS
  Filled 2012-09-29: qty 4

## 2012-09-29 MED ORDER — PROPOFOL 10 MG/ML IV EMUL
5.0000 ug/kg/min | Freq: Once | INTRAVENOUS | Status: AC
  Administered 2012-09-30: 20 ug/kg/min via INTRAVENOUS
  Administered 2012-09-30: 100 mg/h via INTRAVENOUS

## 2012-09-29 MED ORDER — ETOMIDATE 2 MG/ML IV SOLN
40.0000 mg | Freq: Once | INTRAVENOUS | Status: DC
  Filled 2012-09-29: qty 20

## 2012-09-29 MED ORDER — MIDAZOLAM HCL 2 MG/2ML IJ SOLN
4.0000 mg | Freq: Once | INTRAMUSCULAR | Status: AC
  Administered 2012-09-30: 4 mg via INTRAVENOUS
  Filled 2012-09-29: qty 2

## 2012-09-29 MED ORDER — FREE WATER
200.0000 mL | Freq: Three times a day (TID) | Status: DC
  Administered 2012-09-29 – 2012-10-01 (×7): 200 mL

## 2012-09-29 MED ORDER — VECURONIUM BROMIDE 10 MG IV SOLR
10.0000 mg | Freq: Once | INTRAVENOUS | Status: DC
  Filled 2012-09-29: qty 10

## 2012-09-29 MED ORDER — DEXTROSE 10 % IV SOLN
INTRAVENOUS | Status: DC
  Administered 2012-09-29: via INTRAVENOUS

## 2012-09-29 MED ORDER — METOPROLOL TARTRATE 1 MG/ML IV SOLN
2.5000 mg | Freq: Four times a day (QID) | INTRAVENOUS | Status: DC
  Administered 2012-09-29 – 2012-10-01 (×2): 2.5 mg via INTRAVENOUS
  Filled 2012-09-29 (×8): qty 5

## 2012-09-29 MED ORDER — AMLODIPINE BESYLATE 10 MG PO TABS
10.0000 mg | ORAL_TABLET | Freq: Every day | ORAL | Status: DC
  Administered 2012-09-29 – 2012-10-02 (×4): 10 mg via ORAL
  Filled 2012-09-29 (×4): qty 1

## 2012-09-29 NOTE — Progress Notes (Signed)
Subjective:  Still febrile overnight - alert but agitated on vent.  BP remains actually more up than down since yest- good UOP overnight, over 2 liters, still negative balance yest  Today BUN and creatinine both down sllightly  Objective Vital signs in last 24 hours: Filed Vitals:   09/29/12 0500 09/29/12 0600 09/29/12 0700 09/29/12 0730  BP: 176/83 190/84 178/92 178/92  Pulse: 58 65 54 62  Temp:  98.1 F (36.7 C) 98.1 F (36.7 C)   TempSrc:  Core (Comment) Core (Comment)   Resp: 22 24 22  35  Height:      Weight:      SpO2: 99% 99% 99% 99%   Weight change: -2.4 kg (-5 lb 4.7 oz)  Intake/Output Summary (Last 24 hours) at 09/29/12 0745 Last data filed at 09/29/12 0600  Gross per 24 hour  Intake 2054.15 ml  Output   2470 ml  Net -415.85 ml   Labs: Basic Metabolic Panel:  Recent Labs Lab 09/25/12 0445  09/27/12 0430 09/28/12 0349 09/29/12 0431  NA 150*  < > 140 142 148*  K 3.6  < > 2.8* 3.9 3.9  CL 118*  < > 112 111 116*  CO2 21  < > 15* 21 21  GLUCOSE 149*  < > 166* 183* 219*  BUN 73*  < > 75* 90* 89*  CREATININE 3.09*  < > 2.53* 3.13* 2.70*  CALCIUM 9.3  < > 8.0* 9.8 10.4  PHOS 3.8  --  3.9  --  4.1  < > = values in this interval not displayed. Liver Function Tests:  Recent Labs Lab 09/23/12 0435 09/24/12 0500 09/25/12 0445 09/27/12 0430 09/29/12 0431  AST 31 95*  --   --   --   ALT 35 59*  --   --   --   ALKPHOS 53 95  --   --   --   BILITOT 0.8 1.2  --   --   --   PROT 6.1 6.7  --   --   --   ALBUMIN 2.5* 2.4*  2.4* 2.1* 1.8* 2.5*   No results found for this basename: LIPASE, AMYLASE,  in the last 168 hours No results found for this basename: AMMONIA,  in the last 168 hours CBC:  Recent Labs Lab 09/23/12 0435 09/24/12 0500 09/26/12 0400 09/27/12 0430  WBC 7.1 6.9 5.3 6.4  NEUTROABS  --  5.0 3.6  --   HGB 9.8* 10.2* 9.6* 9.0*  HCT 29.5* 31.1* 30.1* 27.3*  MCV 79.1 80.4 81.6 80.3  PLT 155 174 207 190   Cardiac Enzymes: No results found for  this basename: CKTOTAL, CKMB, CKMBINDEX, TROPONINI,  in the last 168 hours CBG:  Recent Labs Lab 09/28/12 1118 09/28/12 1712 09/28/12 1958 09/28/12 2347 09/29/12 0320  GLUCAP 154* 158* 191* 229* 190*    Iron Studies: No results found for this basename: IRON, TIBC, TRANSFERRIN, FERRITIN,  in the last 72 hours Studies/Results: Dg Chest Port 1 View  09/28/2012   *RADIOLOGY REPORT*  Clinical Data: Respiratory difficulty  PORTABLE CHEST - 1 VIEW  Comparison: Yesterday  Findings: Endotracheal tube, NG tube, left internal jugular central venous catheter are stable.  Low lung volumes.  Stable cardiac silhouette.  Stable left basilar opacity.  IMPRESSION: Stable.   Original Report Authenticated By: Marybelle Killings, M.D.   Medications: Infusions: . sodium chloride 10 mL/hr at 09/28/12 0800  . sodium chloride 10 mL/hr at 09/28/12 1000  . dexmedetomidine 0.9 mcg/kg/hr (09/29/12  0717)  . fentaNYL infusion INTRAVENOUS Stopped (09/28/12 1600)    Scheduled Medications: . amLODipine  5 mg Oral Daily  . antiseptic oral rinse  15 mL Mouth Rinse QID  . cefTAZidime (FORTAZ)  IV  1 g Intravenous Q24H  . chlorhexidine  15 mL Mouth Rinse BID  . darbepoetin (ARANESP) injection - NON-DIALYSIS  100 mcg Subcutaneous Q Fri-1800  . feeding supplement (OSMOLITE 1.5 CAL)  1,000 mL Per Tube Q24H  . feeding supplement  60 mL Per Tube TID  . heparin subcutaneous  5,000 Units Subcutaneous Q8H  . hydrALAZINE  50 mg Oral Q8H  . insulin aspart  2-6 Units Subcutaneous Q4H  . insulin glargine  15 Units Subcutaneous QHS  . metoprolol  2.5 mg Intravenous Q6H  . pantoprazole sodium  40 mg Per Tube QHS    have reviewed scheduled and prn medications.  Physical Exam: General: alert and agitated on vent, no commands Heart: RRR Lungs: CBS bilat Abdomen: soft non tender, non distended Extremities: minimal edema   Assessment/ Plan: Pt is a 67 y.o. yo male who was admitted on 09/19/2012 with respiratory failure/cardiac  arrest- variable BP and probable pneumonia  Assessment/Plan: 1. A on CRF- documented baseline CKD with creatinine between 1.5 and 2- now with A on CRF in the setting of respiratory failure/ arrest/variable BP.  Nonoliguric previously on lasix  - numbers slightly better today with increased kidney prerfusion, but now BP chronically high? He is autodiuresing and still did have a neg balance so will not add back lasix yet- maybe tomorrow if numbers cont to improve.  I will increase norvasc back up although I dont think that was why BP went up.  No absolute indications for HD but BUN of 90 probably not helping extubation effort.   2. HTN/volume- BP now chronically high - take norvasc back up and maybe add back lasix tomorrow.  3. Anemia- dropping slowly with hosp- will continue to follow.  S/p  one dose aranesp  4. ID- per CCM-probable PNA with white out- s/p bronch-  Now fortaz and vanc with better temp 5. Vent management- per CCM- I would hope once off the vent can get BP better stabilized 6. Hypokalemia- resolved 7. Hyponatremia- creeping back up, add back free water via tube   Mariluz Crespo A   09/29/2012,7:45 AM  LOS: 10 days

## 2012-09-29 NOTE — Progress Notes (Signed)
Subjective:  The patient had a Tmax of 101.3 overnight, currently afebrile.  On ceftazidime Day #4.  Hypertensive 143-234/66-114 pulse 54-101 bpm. On Metoprolol 2.5 mg IV qid, Hydralazine 50 mg po tid and 10 mg IV prn. Amlodipine decreased to 5 mg po qd from 10 mg yesterday. Last dose of Lasix 09/27/2012. Still intubated. PCCM to attempt weaning again. May need trach.  Objective:  Vital Signs in the last 24 hours: Temp:  [98.1 F (36.7 C)-102.2 F (39 C)] 98.1 F (36.7 C) (05/18 0700) Pulse Rate:  [54-101] 62 (05/18 0730) Resp:  [6-35] 35 (05/18 0730) BP: (143-234)/(66-114) 178/92 mmHg (05/18 0730) SpO2:  [95 %-99 %] 99 % (05/18 0730) FiO2 (%):  [40 %] 40 % (05/18 0730) Weight:  [221 lb 5.5 oz (100.4 kg)] 221 lb 5.5 oz (100.4 kg) (05/18 0300)  Intake/Output from previous day: 05/17 0701 - 05/18 0700 In: 2054.2 [I.V.:914.2; NG/GT:1090; IV Piggyback:50] Out: 2470 [Urine:2370; Stool:100]  Physical Exam: General: lying in bed, intubated, alert,  HEENT: pupils equal round and reactive to light, NCAT Lungs: Mechanical breath sounds, scattered ronchi Heart: regular rate and rhythm, no murmurs, gallops, or rubs appreciated Abdomen: Soft, non-tender, obese with normoactive bowel sounds.  Neuro: alert, eyes tracked MD from the door, nodding in response to questions, moves extremities spontaneously Extremities: trace LE edema, warm   Lab Results:  Recent Labs  09/27/12 0430  WBC 6.4  HGB 9.0*  PLT 190    Recent Labs  09/28/12 0349 09/29/12 0431  NA 142 148*  K 3.9 3.9  CL 111 116*  CO2 21 21  GLUCOSE 183* 219*  BUN 90* 89*  CREATININE 3.13* 2.70*   No results found for this basename: TROPONINI, CK, MB,  in the last 72 hours  Cardiac Studies: Echocardiogram 09/19/12 - Left ventricle: The cavity size was normal. Wall thickness was increased in a pattern of severe LVH. Systolic function was normal. The estimated ejection fraction was in the range of 55% to 60%. -  Pericardium, extracardiac: A moderate, free-flowing pericardial effusion was identified circumferential to the heart. There was no evidence of hemodynamic compromise.  Tele: NSR, few PVC's  Assessment/Plan:  The patient is a 67 yo M, history of hypertrophic cardiomyopathy, presents after PEA arrest with hypertensive emergency.   # Hypertensive emergency - There was concern for possible  central dysregulation as etiology of labile bp's. Labile bp improved although now overtly hypertensive.  On Metoprolol 2.5 mg IV qid, Hydralazine 50 mg po tid and 10 mg IV prn. Amlodipine decreased to 5 mg po qd from 10 mg yesterday. Last dose of Lasix 09/27/2012.  -antihypertensive regimen per Nephrology  # Acute Respiratory Failure/VDRF -  likely pulmonary edema due to non-compliance with lasix, exacerbated by hypertensive emergency/diastolic dysfunction. Pt has been diureses to net negative fluid balance.  CXR with continued LLL consolidation.  Bronchoscopy 5/11 with negative cultures to date. Remains intubated. Off propofol and fentanyl. On precedex and versed. -management per CCM   # Acute on chronic Diastolic CHF - pro-BNP on admission 2818, echo demonstrating EF 55-6-%, severe LVH, nl systolic dysfunction, free-flowing pericardial effusion w/o hemodynamic compromise.  -lasix held -continue metoprolol with hold parameters -patient currently not on ACE-I due to CKD   # PEA Arrest - likely secondary to respiratory distress, converted to sinus rhythm after ~< 1 minute of CPR. Peak Troponin 0.31.  # AKI on CKD - likely secondary to ATN, baseline Creatinine 1.5-2. Good Urine outpt ~2L. No indications for  HD per Nephrology. -follow I/O's and Cr  -per nephrology  Recent Labs Lab 09/25/12 0445 09/26/12 0400 09/27/12 0430 09/28/12 0349 09/29/12 0431  CREATININE 3.09* 2.74* 2.53* 3.13* 2.70*    # Pericardial Effusion. Likely chronic, with interval increase in size from echo 02/2012. No tamponade on  ECHO.   # Fever - concern for pneumonia, no leukocytosis, on ceftazidime Day #4 after short course of ceftriaxone and vancomycin -management per CCM  Recent Labs Lab 09/23/12 0435 09/24/12 0500 09/26/12 0400 09/27/12 0430  WBC 7.1 6.9 5.3 6.4    # Anemia - No evidence of active bleeding. Hgb stable 9-10. On Aranesp per Neprhology. -continue to follow CBC's  Recent Labs Lab 09/23/12 0435 09/24/12 0500 09/26/12 0400 09/27/12 0430  HGB 9.8* 10.2* 9.6* 9.0*    # Hypernatremia - Recurrent, patient receiving normal saline and free water per tube -management per nephrology   Recent Labs Lab 09/25/12 0445 09/26/12 0400 09/27/12 0430 09/28/12 0349 09/29/12 0431  NA 150* 141 140 142 148*     # Hyperkalemia - repleted and resolved  Recent Labs Lab 09/25/12 0445 09/26/12 0400 09/27/12 0430 09/28/12 0349 09/29/12 0431  K 3.6 3.4* 2.8* 3.9 3.9     Dorian Heckle, M.D. 09/29/2012, 7:58 AM

## 2012-09-29 NOTE — Progress Notes (Signed)
PULMONARY  / CRITICAL CARE MEDICINE  Name: Corey [name] MRN: [id number] DOB: [birth year]    ADMISSION DATE:  09/19/2012 CONSULTATION DATE:  09/19/2012  REFERRING MD :  Christy Gentles PRIMARY SERVICE: PCCM  CHIEF COMPLAINT:  Acute respiratory failure  BRIEF PATIENT DESCRIPTION: 67 y/o male with LVH and diastolic dysfunction presented by EMS on 5/8 to the [facility] ED after he had the sudden onset of respiratory failure at home.  He had a seizure and brief cardiac arrest requiring <28min of CPR.    SIGNIFICANT EVENTS / STUDIES:  5/8 CT head-peripheral and tentorial calcifications, increased since 2009 MRI 5/8 Echo- EF 55-60% 5/9: medical renal disease. No hydro 5/11 bronchoscopy-pending culture 5/13- peep to 5 5/15- lasix, even balance ,poor weaning 5/16- neg balance lasix  LINES / TUBES: 5/8 ETT >> 5/8 peripheral IV >> 5/10 LIJ >> 5/10 Art Cath>>out  CULTURES: Blood 5/9>>>ng UC 5/9>>>ng Sputum 5/10: NF 5/14 bronch bal>>>  ANTIBIOTICS: vanc 5/9>>>5/12, 5/14>>>5/16 Zosyn 5/9 (fever)>>>5/12 Ceftriaxone 5/12>>>5/13 Ceftazidime 5/14>>>5/18  SUBJECTIVE:  Neg balance, no lasix, failed wenaing  VITAL SIGNS: Temp:  [98.1 F (36.7 C)-102.2 F (39 C)] 98.1 F (36.7 C) (05/18 1000) Pulse Rate:  [54-101] 55 (05/18 1045) Resp:  [6-35] 22 (05/18 1045) BP: (143-265)/(69-115) 175/79 mmHg (05/18 1045) SpO2:  [95 %-100 %] 99 % (05/18 1045) FiO2 (%):  [40 %] 40 % (05/18 0812) Weight:  [221 lb 5.5 oz (100.4 kg)] 221 lb 5.5 oz (100.4 kg) (05/18 0300) HEMODYNAMICS:   VENTILATOR SETTINGS: Vent Mode:  [-] PRVC FiO2 (%):  [40 %] 40 % Set Rate:  [22 bmp] 22 bmp Vt Set:  [600 mL] 600 mL PEEP:  [5 cmH20] 5 cmH20 Pressure Support:  [10 cmH20] 10 cmH20 Plateau Pressure:  [13 cmH20-33 cmH20] 33 cmH20 INTAKE / OUTPUT: Intake/Output     05/17 0701 - 05/18 0700 05/18 0701 - 05/19 0700   I.V. (mL/kg) 934.2 (9.3) 152.4 (1.5)   Other  60   NG/GT 1115 275   IV Piggyback 50    Total Intake(mL/kg) 2099.2  (20.9) 487.4 (4.9)   Urine (mL/kg/hr) 2370 (1) 445 (1.2)   Stool 100 (0)    Total Output 2470 445   Net -370.9 +42.4        Stool Occurrence 2 x     PHYSICAL EXAMINATION: Gen: awake HEENT: NCAT, PERRL PULM: clear apical CV: RRR, distant heart  AB: BS+, soft, nontender, no hsm Ext: warm, trace pretibial edema reduced Derm: no rash or skin breakdown Neuro: rass 0   LABS:  Recent Labs Lab 09/27/12 0430 09/28/12 0349 09/29/12 0431  NA 140 142 148*  K 2.8* 3.9 3.9  CL 112 111 116*  CO2 15* 21 21  BUN 75* 90* 89*  CREATININE 2.53* 3.13* 2.70*  GLUCOSE 166* 183* 219*    Recent Labs Lab 09/24/12 0500 09/26/12 0400 09/27/12 0430  HGB 10.2* 9.6* 9.0*  HCT 31.1* 30.1* 27.3*  WBC 6.9 5.3 6.4  PLT 174 207 190   ABG    Component Value Date/Time   PHART 7.277* 09/26/2012 1044   PCO2ART 40.0 09/26/2012 1044   PO2ART 76.0* 09/26/2012 1044   HCO3 18.5* 09/26/2012 1044   TCO2 20 09/26/2012 1044   ACIDBASEDEF 8.0* 09/26/2012 1044   O2SAT 92.0 09/26/2012 1044  Triglycerides 123   Recent Labs Lab 09/23/12 0435 09/24/12 0500 09/26/12 0400 09/27/12 0430  PROCALCITON 4.34  --   --   --   WBC 7.1 6.9 5.3 6.4  Recent Labs Lab 09/28/12 1712 09/28/12 1958 09/28/12 2347 09/29/12 0320 09/29/12 0744  GLUCAP 158* 191* 229* 190* 203*   5/17- improved int edema   ASSESSMENT / PLAN:  PULMONARY A: Acute respiratory failure due to pulmonary edema Now w/ LLL worse vol loss involving almost entire left thorax. Possible mucous plugging. Requiring higher FIO2 and bronch 5/11 P:   -wean attempt this am , PS 10-12, failed 5 -Even to pos balance goals -plan trach in am  CARDIOVASCULAR A: Acute on chronic CHF presumably due to hypertensive emergency Pericardial effusion, moderate, no tamponade on echo 5/8 Hypertension, wife reports occasional medication non-compliance PEA arrest (in setting of decomp HF) Cards following. Lower BP noted 5/16 P:  -continue metoprolol and  hydralazine Norvasc reduced Afterload controlled Add hold HR parameters beta blocher  RENAL A:  Acute on Chronic renal failure, CKD, at baseline, Cr worsening BP up and down , prob ATN Hypernatremia-resolved wrosen crt with neg balance P:   -agree increase free water Chem in am   GASTROINTESTINAL A:  No acute issues P:   - Protonix SUP - Continue TF  HEMATOLOGIC A: Anemia (due to CKD?), dilutional as well, increased hct P:  -continue [place] heparin coags now for trach  INFECTIOUS A: Possible aspiration PNA P:   Maintain ceftaz, will consider empiric course, allow to end today  ENDOCRINE A:  DM2, on 70/30 35 units qHS at home P:   - goals NICE  NEUROLOGIC A:  Acute encephalopathy due to hypoxemia; improved mental status after intubation ?Seizure at home> currently mental status is OK, hypoxemia most likely explanation which has now resolved P:   -WUA  -chair position Tolerating precedex  I have fully examined this patient and agree with above findings.    And edited in full  Cm time 30 min   [name]. [name], MD, [place] Pgr: [facility]

## 2012-09-29 NOTE — Progress Notes (Signed)
Patient Name: Corey [name]      SUBJECTIVE: Hypertrophic cardiomyopathy (??)admitted 5/8 with respiratory failure and hypertensive emergency That same day he ended up with a PEA arrest. Course complicated by fever and renal failure with gradual improvement  Past Medical History  Diagnosis Date  . Renal insufficiency   . Diabetes mellitus   . Hypertension   . Gout   . Colon polyp 2003    Dr [name]; F/U was to be 2008( not completed)  . Diverticulosis 2003  . Pneumonia 09/29/11    [facility] X 10 daysas OP    PHYSICAL EXAM Filed Vitals:   09/29/12 0807 09/29/12 0812 09/29/12 0815 09/29/12 0830  BP: 265/115 265/115 208/97 188/85  Pulse:  84 75 74  Temp:      TempSrc:      Resp:  28 24 22   Height:      Weight:      SpO2:  100% 99% 98%   Well developed and nourished intubated HENT normal Neck supple with JVP-flat Clear Regular rate and rhythm, no murmurs or gallops Abd-soft with active BS No Clubbing cyanosis edema Skin-warm and dry A & Oriented  Grossly normal sensory and motor function  Temp (24hrs), Avg:99.9 F (37.7 C), Min:98.1 F (36.7 C), Max:102.2 F (39 C)     Intake/Output Summary (Last 24 hours) at 09/29/12 0857 Last data filed at 09/29/12 0800  Gross per 24 hour  Intake 2263.35 ml  Output   2410 ml  Net -146.65 ml    LABS: Basic Metabolic Panel:  Recent Labs Lab 09/23/12 0435  09/24/12 0500 09/25/12 0445 09/26/12 0400 09/27/12 0430 09/28/12 0349 09/29/12 0431  NA 145  --  147*  148* 150* 141 140 142 148*  K 3.8  --  3.9  3.9 3.6 3.4* 2.8* 3.9 3.9  CL 113*  --  114*  114* 118* 110 112 111 116*  CO2 19  --  21  20 21  17* 15* 21 21  GLUCOSE 99  --  175*  176* 149* 178* 166* 183* 219*  BUN 67*  --  69*  70* 73* 74* 75* 90* 89*  CREATININE 4.56*  --  3.53*  3.54* 3.09* 2.74* 2.53* 3.13* 2.70*  CALCIUM 9.1  --  9.3  9.6 9.3 9.2 8.0* 9.8 10.4  PHOS  --   < > 4.9* 3.8  --  3.9  --  4.1  < > = values in this interval not  displayed. Cardiac Enzymes: No results found for this basename: CKTOTAL, CKMB, CKMBINDEX, TROPONINI,  in the last 72 hours CBC:  Recent Labs Lab 09/23/12 0435 09/24/12 0500 09/26/12 0400 09/27/12 0430  WBC 7.1 6.9 5.3 6.4  NEUTROABS  --  5.0 3.6  --   HGB 9.8* 10.2* 9.6* 9.0*  HCT 29.5* 31.1* 30.1* 27.3*  MCV 79.1 80.4 81.6 80.3  PLT 155 174 207 190   PROTIME: No results found for this basename: LABPROT, INR,  in the last 72 hours Liver Function Tests:  Recent Labs  09/27/12 0430 09/29/12 0431  ALBUMIN 1.8* 2.5*   No results found for this basename: LIPASE, AMYLASE,  in the last 72 hours BNP: BNP (last 3 results)  Recent Labs  02/19/12 1703 09/19/12 0432 09/23/12 1200  PROBNP 416.0* 2818.0* 1211.0*   D-Dimer: No results found for this basename: DDIMER,  in the last 72 hours Hemoglobin A1C: No results found for this basename: HGBA1C,  in the last 72 hours Fasting Lipid Panel:  Recent Labs  09/28/12 0349  TRIG 174*    ASSESSMENT AND PLAN:  Active Problems:   Acute pulmonary edema   Cardiac arrest   Atelectasis  continyue supportive care Signed, Virl Axe MD  09/29/2012

## 2012-09-30 ENCOUNTER — Inpatient Hospital Stay (HOSPITAL_COMMUNITY): Payer: No Typology Code available for payment source

## 2012-09-30 DIAGNOSIS — J9601 Acute respiratory failure with hypoxia: Secondary | ICD-10-CM | POA: Diagnosis present

## 2012-09-30 LAB — GLUCOSE, CAPILLARY
Glucose-Capillary: 172 mg/dL — ABNORMAL HIGH (ref 70–99)
Glucose-Capillary: 145 mg/dL — ABNORMAL HIGH (ref 70–99)
Glucose-Capillary: 129 mg/dL — ABNORMAL HIGH (ref 70–99)
Glucose-Capillary: 131 mg/dL — ABNORMAL HIGH (ref 70–99)
Glucose-Capillary: 184 mg/dL — ABNORMAL HIGH (ref 70–99)
Glucose-Capillary: 173 mg/dL — ABNORMAL HIGH (ref 70–99)
Glucose-Capillary: 181 mg/dL — ABNORMAL HIGH (ref 70–99)

## 2012-09-30 LAB — CBC WITH DIFFERENTIAL/PLATELET
Eosinophils Absolute: 0.4 10*3/uL (ref 0.0–0.7)
Eosinophils Relative: 4 % (ref 0–5)
HCT: 33.2 % — ABNORMAL LOW (ref 39.0–52.0)
Hemoglobin: 10.6 g/dL — ABNORMAL LOW (ref 13.0–17.0)
Lymphs Abs: 1.6 10*3/uL (ref 0.7–4.0)
MCH: 25.7 pg — ABNORMAL LOW (ref 26.0–34.0)
MCV: 80.6 fL (ref 78.0–100.0)
Monocytes Relative: 8 % (ref 3–12)
RBC: 4.12 MIL/uL — ABNORMAL LOW (ref 4.22–5.81)

## 2012-09-30 LAB — RENAL FUNCTION PANEL
CO2: 21 mEq/L (ref 19–32)
Chloride: 117 mEq/L — ABNORMAL HIGH (ref 96–112)
GFR calc Af Amer: 34 mL/min — ABNORMAL LOW (ref >90)
Glucose, Bld: 139 mg/dL — ABNORMAL HIGH (ref 70–99)
Phosphorus: 3.6 mg/dL (ref 2.3–4.6)
Potassium: 3.5 mEq/L (ref 3.5–5.1)
Sodium: 148 mEq/L — ABNORMAL HIGH (ref 135–145)

## 2012-09-30 LAB — APTT: aPTT: 30 seconds (ref 24–37)

## 2012-09-30 MED ORDER — DEXTROSE 10 % IV SOLN: INTRAVENOUS | Status: DC | PRN

## 2012-09-30 MED ORDER — DEXTROSE 5 % IV SOLN
1.0000 g | Freq: Two times a day (BID) | INTRAVENOUS | Status: DC
  Administered 2012-09-30 (×2): 1 g via INTRAVENOUS
  Filled 2012-09-30 (×4): qty 1

## 2012-09-30 MED ORDER — ISOSORBIDE DINITRATE 10 MG PO TABS
10.0000 mg | ORAL_TABLET | Freq: Three times a day (TID) | ORAL | Status: DC
  Administered 2012-09-30 – 2012-10-02 (×8): 10 mg
  Filled 2012-09-30 (×10): qty 1

## 2012-09-30 MED ORDER — INSULIN GLARGINE 100 UNIT/ML ~~LOC~~ SOLN
15.0000 [IU] | SUBCUTANEOUS | Status: DC
  Administered 2012-09-30 – 2012-10-02 (×3): 15 [IU] via SUBCUTANEOUS
  Filled 2012-09-30 (×3): qty 0.15

## 2012-09-30 MED ORDER — INSULIN ASPART 100 UNIT/ML ~~LOC~~ SOLN
1.0000 [IU] | SUBCUTANEOUS | Status: DC
  Administered 2012-09-30: 3 [IU] via SUBCUTANEOUS
  Administered 2012-09-30: 09:00:00 via SUBCUTANEOUS
  Administered 2012-09-30 – 2012-10-01 (×3): 2 [IU] via SUBCUTANEOUS
  Administered 2012-10-01 (×2): 1 [IU] via SUBCUTANEOUS
  Administered 2012-10-01 – 2012-10-02 (×5): 2 [IU] via SUBCUTANEOUS

## 2012-09-30 MED ORDER — PROPOFOL 10 MG/ML IV EMUL
5.0000 ug/kg/min | INTRAVENOUS | Status: DC
  Administered 2012-09-30: 100 mg/h via INTRAVENOUS
  Administered 2012-09-30: 40 ug/kg/min via INTRAVENOUS

## 2012-09-30 MED ORDER — FENTANYL CITRATE 0.05 MG/ML IJ SOLN: 25.0000 ug | INTRAMUSCULAR | Status: DC | PRN

## 2012-09-30 MED ORDER — ISOSORBIDE MONONITRATE ER 30 MG PO TB24
30.0000 mg | ORAL_TABLET | Freq: Every day | ORAL | Status: DC
  Filled 2012-09-30: qty 1

## 2012-09-30 MED ORDER — INSULIN ASPART 100 UNIT/ML ~~LOC~~ SOLN
2.0000 [IU] | SUBCUTANEOUS | Status: DC
  Administered 2012-09-30 – 2012-10-01 (×3): 2 [IU] via SUBCUTANEOUS

## 2012-09-30 MED ORDER — DEXMEDETOMIDINE HCL IN NACL 400 MCG/100ML IV SOLN
0.2000 ug/kg/h | INTRAVENOUS | Status: DC
  Administered 2012-09-30 – 2012-10-01 (×4): 0.9 ug/kg/h via INTRAVENOUS
  Administered 2012-10-01: 1.1 ug/kg/h via INTRAVENOUS
  Administered 2012-10-01 – 2012-10-02 (×5): 0.9 ug/kg/h via INTRAVENOUS
  Administered 2012-10-02: 0.897 ug/kg/h via INTRAVENOUS
  Filled 2012-09-30 (×12): qty 100

## 2012-09-30 MED ORDER — POTASSIUM CHLORIDE 20 MEQ/15ML (10%) PO LIQD
20.0000 meq | Freq: Once | ORAL | Status: AC
  Administered 2012-09-30: 20 meq via ORAL
  Filled 2012-09-30: qty 15

## 2012-09-30 NOTE — Progress Notes (Signed)
Subjective:  The patient was afebrile overnight.  BP's remained labile, from SBP 140-200's, though overall mostly in the SBP 180's.  The patient was bradycardic to the 50's overnight.  The patient is able to follow commands, but failed SBT's over the weekend.  Objective:  Vital Signs in the last 24 hours: Temp:  [98.1 F (36.7 C)-98.6 F (37 C)] 98.1 F (36.7 C) (05/19 0400) Pulse Rate:  [52-84] 57 (05/19 0600) Resp:  [19-35] 22 (05/19 0600) BP: (128-265)/(65-115) 207/101 mmHg (05/19 0600) SpO2:  [92 %-100 %] 98 % (05/19 0600) FiO2 (%):  [40 %] 40 % (05/19 0600) Weight:  [227 lb 1.2 oz (103 kg)] 227 lb 1.2 oz (103 kg) (05/19 0300)  Intake/Output from previous day: 05/18 0701 - 05/19 0700 In: 2609.6 [I.V.:1319.6; NG/GT:1120; IV Piggyback:50] Out: 2135 [Urine:1835; Stool:300]  Physical Exam: General: lying in bed, intubated, sedated HEENT: pupils equal round and reactive to light, oropharynx clear and non-erythematous Neck: supple, no lymphadenopathy Lungs: Mechanical breath sounds, scattered ronchi Heart: bradycardic, regular rhythm, no murmurs, gallops, or rubs Abdomen: Soft, non-tender, non-distended with normoactive bowel sounds. No hepatomegaly. No rebound/guarding. No obvious abdominal masses. Extremities: 2+ pitting edema bilaterally Neurologic: sedated, intubated, opens eyes to voice, follows commands  Lab Results:  Recent Labs  09/30/12 0500  WBC 10.1  HGB 10.6*  PLT 311    Recent Labs  09/29/12 0431 09/30/12 0500  NA 148* 148*  K 3.9 3.5  CL 116* 117*  CO2 21 21  GLUCOSE 219* 139*  BUN 89* 82*  CREATININE 2.70* 2.26*   No results found for this basename: TROPONINI, CK, MB,  in the last 72 hours  Cardiac Studies: Echocardiogram 09/19/12 - Left ventricle: The cavity size was normal. Wall thickness was increased in a pattern of severe LVH. Systolic function was normal. The estimated ejection fraction was in the range of 55% to 60%. - Pericardium,  extracardiac: A moderate, free-flowing pericardial effusion was identified circumferential to the heart. There was no evidence of hemodynamic compromise.  Tele: Sinus brady, few PVC's  Assessment/Plan:  The patient is a 67 yo M, history of hypertrophic cardiomyopathy, presents after PEA arrest with hypertensive emergency.   # Hypertensive emergency - the patient presents with initial BP 216/165, with altered mental status, pulmonary edema. The patient's BP decreased soon after admission with sedation alone, then has remained labile since admission despite only small doses of anti-hypertensives.  Given PEA arrest, labile BP's may be a sign of central dysregulation, perhaps suggesting worse neurologic status than previously suspected. -continue amlodipine, hydralazine, and metoprolol  # Acute Respiratory Failure/VDRF - The patient presented with acute respiratory failure, with evidence of pulmonary edema on CXR. The etiology for this event was likely pulmonary edema due to non-compliance with lasix, exacerbated by hypertensive emergency/diastolic dysfunction.  He is s/p bronchoscopy 5/11, due to L lung opacity, and cultures have been negative to date.  Repeat bronch 5/14 unremarkable. Currently intubated, sedated. On propofol, fentanyl.  -management per CCM, may place trach as early as today  # Acute on chronic Diastolic CHF - echo from 0000000 shows significant LVH, with grade 2 diastolic dysfunction, though with an EF of 55-60%. CXR shows evidence of pulmonary edema, and patient has bilateral pitting edema, consistent with volume overload. May have been worsened by hypertensive emergency.   -nephrology consulting, holding diuretics -continue metoprolol  -patient currently not on ACE-I due to CKD   # PEA Arrest - EMS notes PEA after respiratory distress and convulsion  activity, which reverted to sinus rhythm after approx 1 minute of CPR. This process was likely driven by acute respiratory failure  (see above). Troponin only mildly elevated to 0.31.  # AKI on CKD - Likely due to ATN, due to labile BP's during admission.  Cr continues to downtrend to 2.3 this morning, though this is still increased from patient's baseline of around 1.5-2, though with good UOP.  -follow I/O's and Cr  -per nephrology  # Pericardial Effusion. Likely chronic, with interval increase in size from echo 02/2012. No evid for tamponade. ESR normal.  # Fever - Intermittent fevers may be due to PNA, concern for aspiration given patient's presentation.  -management per CCM, ceftriaxone  # Anemia - hemoglobin slowly downtrending this admission.  No evidence of active bleeding. -continue to follow CBC's  # Hypernatremia -  Patient receiving D5 and free water per tube -per renal  # Hyperkalemia - repleted  Elnora Morrison, M.D. 09/30/2012, 6:17 AM  Patient examined chart reviewed. No MI EF normal Poorly controlled HTN with failure to wean ? Lurline Idol today continue precedex. Will sing off as CCM and renal can mangage BP issues  Jenkins Rouge

## 2012-09-30 NOTE — Progress Notes (Signed)
Patient ID: Corey [name], male   DOB: 1945/12/21, 68 y.o.   MRN: [id number]   [facility] Progress Note    Subjective:   Overnight events noted- afebrile and remains hypertensive   Objective:   BP 191/91  Pulse 48  Temp(Src) 98.1 F (36.7 C) (Core (Comment))  Resp 22  Ht 5\' 6"  (1.676 m)  Wt 103 kg (227 lb 1.2 oz)  BMI 36.67 kg/m2  SpO2 98%  Intake/Output Summary (Last 24 hours) at 09/30/12 0754 Last data filed at 09/30/12 0700  Gross per 24 hour  Intake 2669.55 ml  Output   2135 ml  Net 534.55 ml   Weight change: 2.6 kg (5 lb 11.7 oz)  Physical Exam: [id number] regular bradycardia Resp: Anteriorly CTA- difficult to auscultate as the bed percussion is on  [id number], obese, NT, BS normal Ext:2+ edema over feet  Imaging: Dg Chest Port 1 View  09/30/2012   *RADIOLOGY REPORT*  Clinical Data: Follow up pulmonary edema.  PORTABLE CHEST - 1 VIEW  Comparison: 09/28/2012.  Findings: The support apparatus is stable.  The heart remains enlarged and there is a persistent left lower lobe process likely a combination of effusion, atelectasis and infiltrate.  Stable eventration of the right hemidiaphragm with overlying atelectasis.  IMPRESSION:  1.  Stable support apparatus. 2.  Stable appearance of the heart and lungs.  Persistent left lower lobe process.   Original Report Authenticated By: Marijo Sanes, M.D.    Labs: BMET  Recent Labs Lab 09/24/12 0500 09/25/12 0445 09/26/12 0400 09/27/12 0430 09/28/12 0349 09/29/12 0431 09/30/12 0500  NA 147*  148* 150* 141 140 142 148* 148*  K 3.9  3.9 3.6 3.4* 2.8* 3.9 3.9 3.5  CL 114*  114* 118* 110 112 111 116* 117*  CO2 21  20 21  17* 15* 21 21 21   GLUCOSE 175*  176* 149* 178* 166* 183* 219* 139*  BUN 69*  70* 73* 74* 75* 90* 89* 82*  CREATININE 3.53*  3.54* 3.09* 2.74* 2.53* 3.13* 2.70* 2.26*  CALCIUM 9.3  9.6 9.3 9.2 8.0* 9.8 10.4 10.5  PHOS 4.9* 3.8  --  3.9  --  4.1 3.6    CBC  Recent Labs Lab 09/24/12 0500 09/26/12 0400 09/27/12 0430 09/30/12 0500  WBC 6.9 5.3 6.4 10.1  NEUTROABS 5.0 3.6  --  7.3  HGB 10.2* 9.6* 9.0* 10.6*  HCT 31.1* 30.1* 27.3* 33.2*  MCV 80.4 81.6 80.3 80.6  PLT 174 207 190 311    Medications:    . amLODipine  10 mg Oral Daily  . antiseptic oral rinse  15 mL Mouth Rinse QID  . cefTAZidime (FORTAZ)  IV  1 g Intravenous Q12H  . chlorhexidine  15 mL Mouth Rinse BID  . darbepoetin (ARANESP) injection - NON-DIALYSIS  100 mcg Subcutaneous Q Fri-1800  . etomidate  40 mg Intravenous Once  . feeding supplement (OSMOLITE 1.5 CAL)  1,000 mL Per Tube Q24H  . feeding supplement  60 mL Per Tube TID  . fentaNYL  200 mcg Intravenous Once  . free water  200 mL Per Tube Q8H  . heparin subcutaneous  5,000 Units Subcutaneous Q8H  . hydrALAZINE  50 mg Oral Q8H  . insulin aspart  1-3 Units Subcutaneous Q4H  . insulin aspart  2 Units Subcutaneous Q4H  . insulin glargine  15 Units Subcutaneous Q24H  . metoprolol  2.5 mg Intravenous Q6H  . midazolam  4 mg Intravenous Once  . pantoprazole sodium  40 mg Per Tube QHS  . propofol  5-70 mcg/kg/min Intravenous Once  . vecuronium  10 mg Intravenous Once     Assessment/ Plan:   1. Acute on CRF- Baseline CKD with creatinine between 1.5 and 2- now with Acute on CRF in the setting of respiratory failure/ arrest/variable BP-- renal function now improving with better UOP.No indications for HD  At this time.  2. HTN/volume- BP now chronically high -on hydralazine/norvasc- will restart lasix tomorrow and add imdur today  3. Anemia- dropping slowly with hosp- will continue to follow. S/p one dose aranesp  4. ID- per CCM-probable PNA with white out- s/p bronch- Now fortaz and vanc with better temp  5. VDRF- per CCM- I would hope once off the vent can get BP better stabilized  6. Hypernatremia- creeping back up, add back free water via tube   Elmarie Shiley, MD 09/30/2012, 7:54 AM

## 2012-09-30 NOTE — Progress Notes (Signed)
NUTRITION FOLLOW UP  Intervention:   1. Continue current EN regimen.  2. If pt to continue on vent support with trach, recommend PEG  Nutrition Dx:   Inadequate oral intake related to inability to eat as evidenced by NPO  Goal:   Enteral nutrition to provide 60-70% of estimated calorie needs (22-25 kcals/kg ideal body weight) and 100% of estimated protein needs, based on ASPEN guidelines for permissive underfeeding in critically ill obese individuals.   Monitor:   Vent status, weight trends, labs, TF tolerance  Assessment:   S/p bronch on 5/11 and again on 5/14. Pt continued to fail weaning attempts and trach was placed at bedside this morning. Renal function improving with increased urin out put, no need for HD at this time. Pt continues to have issues with hypernatremia, CCM/Renal managing free water per tube.  Pt has NG tube in place, Osmolite 1.5 infusing at 25 ml/hr, 60 ml Pro-stat TID. This EN regimen is providing 1500 kcal, 128 gm Protein, and 457 ml free water.    Height: Ht Readings from Last 1 Encounters:  09/19/12 5\' 6"  (1.676 m)    Weight Status:   Wt Readings from Last 1 Encounters:  09/30/12 227 lb 1.2 oz (103 kg)  weight variable, likely related to fluids. Admission weight of 222 lbs.   Patient is currently intubated on ventilator support.  MV: 12.7 Temp:Temp (24hrs), Avg:98.3 F (36.8 C), Min:98.1 F (36.7 C), Max:98.6 F (37 C)  Propofol: none  Re-estimated needs:  Kcal: 2059 Underfeeding goal: 1419-1612 kcal (based on 22-25 kcal/kg ideal body weight) Protein: >/= 129 gm  Fluid: >/= 1.4 L   Skin: intact   Diet Order: NPO   Intake/Output Summary (Last 24 hours) at 09/30/12 1420 Last data filed at 09/30/12 1400  Gross per 24 hour  Intake 2415.1 ml  Output   1795 ml  Net  620.1 ml    Last BM: none documented for this admission    Labs:   Recent Labs Lab 09/27/12 0430 09/28/12 0349 09/29/12 0431 09/30/12 0500  NA 140 142 148* 148*  K  2.8* 3.9 3.9 3.5  CL 112 111 116* 117*  CO2 15* 21 21 21   BUN 75* 90* 89* 82*  CREATININE 2.53* 3.13* 2.70* 2.26*  CALCIUM 8.0* 9.8 10.4 10.5  PHOS 3.9  --  4.1 3.6  GLUCOSE 166* 183* 219* 139*    CBG (last 3)   Recent Labs  09/30/12 0603 09/30/12 0750 09/30/12 1203  GLUCAP 131* 184* 173*    Scheduled Meds: . amLODipine  10 mg Oral Daily  . antiseptic oral rinse  15 mL Mouth Rinse QID  . cefTAZidime (FORTAZ)  IV  1 g Intravenous Q12H  . chlorhexidine  15 mL Mouth Rinse BID  . darbepoetin (ARANESP) injection - NON-DIALYSIS  100 mcg Subcutaneous Q Fri-1800  . etomidate  40 mg Intravenous Once  . feeding supplement (OSMOLITE 1.5 CAL)  1,000 mL Per Tube Q24H  . feeding supplement  60 mL Per Tube TID  . free water  200 mL Per Tube Q8H  . heparin subcutaneous  5,000 Units Subcutaneous Q8H  . hydrALAZINE  50 mg Oral Q8H  . insulin aspart  1-3 Units Subcutaneous Q4H  . insulin aspart  2 Units Subcutaneous Q4H  . insulin glargine  15 Units Subcutaneous Q24H  . isosorbide dinitrate  10 mg Per Tube TID  . metoprolol  2.5 mg Intravenous Q6H  . pantoprazole sodium  40 mg Per Tube QHS  Continuous Infusions: . sodium chloride 10 mL/hr at 09/30/12 0343  . sodium chloride 10 mL/hr at 09/30/12 0343  . dexmedetomidine 0.9 mcg/kg/hr (09/30/12 1400)  . dextrose 40 mL/hr at 09/29/12 2354  . dextrose    . fentaNYL infusion INTRAVENOUS Stopped (09/28/12 1600)  . propofol Stopped (09/30/12 1130)    Orson Slick RD, LDN Pager [phone] After Hours pager [phone]

## 2012-09-30 NOTE — Procedures (Signed)
Perc trach See full dictation Blood loss  15 cc  Tolerated well  Size 6 placed  [name]. [name], MD, Grace Pgr: [facility]

## 2012-09-30 NOTE — Progress Notes (Signed)
PULMONARY  / CRITICAL CARE MEDICINE  Name: Corey [name] MRN: [id number] DOB: 1946-02-20    ADMISSION DATE:  09/19/2012 CONSULTATION DATE:  09/19/2012  REFERRING MD :  Christy Gentles PRIMARY SERVICE: PCCM  CHIEF COMPLAINT:  Acute respiratory failure  BRIEF PATIENT DESCRIPTION: 67 y/o male with LVH and diastolic dysfunction presented by EMS on 5/8 to the [facility] ED after he had the sudden onset of respiratory failure at home.  He had a seizure and brief cardiac arrest requiring <67min of CPR.    SIGNIFICANT EVENTS / STUDIES:  5/8 CT head-peripheral and tentorial calcifications, increased since 2009 MRI 5/8 Echo- EF 55-60% 5/9: medical renal disease. No hydro 5/11 bronchoscopy-pending culture 5/13- peep to 5 5/15- lasix, even balance ,poor weaning 5/16- neg balance lasix  LINES / TUBES: 5/8 ETT >> 5/8 peripheral IV >> 5/10 LIJ >> 5/10 Art Cath>>out  CULTURES: Blood 5/9>>>ng UC 5/9>>>ng Sputum 5/10: NF 5/14 bronch bal>>>ng  ANTIBIOTICS: vanc 5/9>>>5/12, 5/14>>>5/16 Zosyn 5/9 (fever)>>>5/12 Ceftriaxone 5/12>>>5/13 Ceftazidime 5/14>>>5/18  SUBJECTIVE:  Afebrile BP remains labile No obvious pain  VITAL SIGNS: Temp:  [98.1 F (36.7 C)-98.6 F (37 C)] 98.1 F (36.7 C) (05/19 0800) Pulse Rate:  [48-70] 53 (05/19 0800) Resp:  [19-29] 22 (05/19 0800) BP: (128-207)/(65-101) 178/99 mmHg (05/19 0800) SpO2:  [92 %-100 %] 98 % (05/19 0800) FiO2 (%):  [40 %] 40 % (05/19 0740) Weight:  [103 kg (227 lb 1.2 oz)] 103 kg (227 lb 1.2 oz) (05/19 0300) HEMODYNAMICS:   VENTILATOR SETTINGS: Vent Mode:  [-] PRVC FiO2 (%):  [40 %] 40 % Set Rate:  [22 bmp] 22 bmp Vt Set:  [600 mL] 600 mL PEEP:  [5 cmH20] 5 cmH20 Plateau Pressure:  [19 cmH20-24 cmH20] 20 cmH20 INTAKE / OUTPUT: Intake/Output     05/18 0701 - 05/19 0700 05/19 0701 - 05/20 0700   I.V. (mL/kg) 1379.6 (13.4) 106.2 (1)   Other 120    NG/GT 1120    IV Piggyback 50    Total Intake(mL/kg) 2669.6 (25.9) 106.2 (1)   Urine (mL/kg/hr)  1835 (0.7) 250 (1.5)   Stool 300 (0.1)    Total Output 2135 250   Net +534.6 -143.8         PHYSICAL EXAMINATION: Gen: easily arousable on precedex HEENT: NCAT, PERRL PULM: clear apical CV: RRR, distant heart  AB: BS+, soft, nontender, no hsm Ext: warm, trace pretibial edema reduced Derm: no rash or skin breakdown Neuro: rass 0, non focal   LABS:  Recent Labs Lab 09/28/12 0349 09/29/12 0431 09/30/12 0500  NA 142 148* 148*  K 3.9 3.9 3.5  CL 111 116* 117*  CO2 21 21 21   BUN 90* 89* 82*  CREATININE 3.13* 2.70* 2.26*  GLUCOSE 183* 219* 139*    Recent Labs Lab 09/26/12 0400 09/27/12 0430 09/30/12 0500  HGB 9.6* 9.0* 10.6*  HCT 30.1* 27.3* 33.2*  WBC 5.3 6.4 10.1  PLT 207 190 311   ABG    Component Value Date/Time   PHART 7.277* 09/26/2012 1044   PCO2ART 40.0 09/26/2012 1044   PO2ART 76.0* 09/26/2012 1044   HCO3 18.5* 09/26/2012 1044   TCO2 20 09/26/2012 1044   ACIDBASEDEF 8.0* 09/26/2012 1044   O2SAT 92.0 09/26/2012 1044  Triglycerides 123   Recent Labs Lab 09/24/12 0500 09/26/12 0400 09/27/12 0430 09/30/12 0500  WBC 6.9 5.3 6.4 10.1    Recent Labs Lab 09/30/12 0301 09/30/12 0353 09/30/12 0459 09/30/12 0603 09/30/12 0750  GLUCAP 131* 145* 129* 131* 184*  5/19 - LLL atelectasis persists  ASSESSMENT / PLAN:  PULMONARY A: Acute respiratory failure due to pulmonary edema  LLL atelectasis. Possible mucous plugging. Required higher FIO2 and bronch 5/11 P:   -plan trach with bronch & aggressive wean after  CARDIOVASCULAR A: Acute on chronic CHF presumably due to hypertensive emergency Pericardial effusion, moderate, no tamponade on echo 5/8 Hypertension, wife reports occasional medication non-compliance PEA arrest (in setting of decomp HF) P:  -continue metoprolol , hydralazine,Norvasc -Add hold HR parameters beta blocher -Added nitrates  RENAL A:  Acute on Chronic renal failure, CKD, at baseline Hypernatremia- wrosen crt with neg  balance P:   -ct free water Chem in am   GASTROINTESTINAL A:  No acute issues P:   - Protonix SUP - Continue TF  HEMATOLOGIC A: Anemia (due to CKD?), dilutional as well, increased hct P:  -continue [place] heparin coags now for trach  INFECTIOUS A: Possible aspiration PNA P:   Maintain ceftaz, will consider empiric x 5-7ds  ENDOCRINE A:  DM2, on 70/30 35 units qHS at home P:   - goals NICE  NEUROLOGIC A:  Acute encephalopathy due to hypoxemia; improved mental status after intubation ?Seizure at home> currently mental status is OK, hypoxemia most likely explanation which has now resolved P:   -WUA  -chair position Tolerating precedex   Care during the described time interval was provided by me and/or other providers on the critical care team.  I have reviewed this patient's available data, including medical history, events of note, physical examination and test results as part of my evaluation  CC time x 51m  Kara Mead MD. FCCP. [facility] Pager [phone] If no response call [phone]

## 2012-09-30 NOTE — Procedures (Signed)
Bedside Tracheostomy Insertion Procedure Note   Patient Details:   Name: Corey [name] DOB: 1945/11/04 MRN: [id number]  Procedure: Tracheostomy  Pre Procedure Assessment:  Bite block in place: Yes Breath Sounds: Clear  Post Procedure Assessment: BP 177/94  Pulse 49  Temp(Src) 98.1 F (36.7 C) (Core (Comment))  Resp 22  Ht 5\' 6"  (1.676 m)  Wt 227 lb 1.2 oz (103 kg)  BMI 36.67 kg/m2  SpO2 98% O2 sats: stable throughout Complications: No apparent complications Patient did tolerate procedure well Tracheostomy Brand:Shiley Tracheostomy Style:Cuffed Tracheostomy Size: 6.0 Tracheostomy Secured [id number] Tracheostomy Placement Confirmation:Trach cuff visualized and in place and Chest X ray ordered for placement    Sharyne Richters 09/30/2012, 11:26 AM

## 2012-10-01 ENCOUNTER — Inpatient Hospital Stay (HOSPITAL_COMMUNITY): Payer: No Typology Code available for payment source

## 2012-10-01 LAB — BASIC METABOLIC PANEL
Chloride: 117 mEq/L — ABNORMAL HIGH (ref 96–112)
Creatinine, Ser: 1.95 mg/dL — ABNORMAL HIGH (ref 0.50–1.35)
GFR calc Af Amer: 40 mL/min — ABNORMAL LOW (ref >90)
Potassium: 3.6 mEq/L (ref 3.5–5.1)
Sodium: 148 mEq/L — ABNORMAL HIGH (ref 135–145)

## 2012-10-01 LAB — CBC
HCT: 33.4 % — ABNORMAL LOW (ref 39.0–52.0)
RBC: 4.12 MIL/uL — ABNORMAL LOW (ref 4.22–5.81)
RDW: 14.5 % (ref 11.5–15.5)
WBC: 9.6 10*3/uL (ref 4.0–10.5)

## 2012-10-01 LAB — GLUCOSE, CAPILLARY
Glucose-Capillary: 189 mg/dL — ABNORMAL HIGH (ref 70–99)
Glucose-Capillary: 178 mg/dL — ABNORMAL HIGH (ref 70–99)
Glucose-Capillary: 134 mg/dL — ABNORMAL HIGH (ref 70–99)
Glucose-Capillary: 137 mg/dL — ABNORMAL HIGH (ref 70–99)

## 2012-10-01 LAB — TRIGLYCERIDES: Triglycerides: 182 mg/dL — ABNORMAL HIGH (ref <150)

## 2012-10-01 MED ORDER — INSULIN ASPART 100 UNIT/ML ~~LOC~~ SOLN
5.0000 [IU] | SUBCUTANEOUS | Status: DC
  Administered 2012-10-01 – 2012-10-02 (×8): 5 [IU] via SUBCUTANEOUS
  Administered 2012-10-02: 08:00:00 via SUBCUTANEOUS

## 2012-10-01 MED ORDER — FREE WATER
250.0000 mL | Freq: Every day | Status: DC
  Administered 2012-10-01 – 2012-10-02 (×7): 250 mL

## 2012-10-01 MED ORDER — FUROSEMIDE 40 MG PO TABS
40.0000 mg | ORAL_TABLET | Freq: Every day | ORAL | Status: DC
  Administered 2012-10-01 – 2012-10-02 (×2): 40 mg
  Filled 2012-10-01 (×2): qty 1

## 2012-10-01 MED ORDER — METOPROLOL TARTRATE 50 MG PO TABS
50.0000 mg | ORAL_TABLET | Freq: Two times a day (BID) | ORAL | Status: DC
  Administered 2012-10-01 – 2012-10-02 (×3): 50 mg via NASOGASTRIC
  Filled 2012-10-01 (×5): qty 1

## 2012-10-01 MED ORDER — INSULIN ASPART 100 UNIT/ML ~~LOC~~ SOLN: 5.0000 [IU] | SUBCUTANEOUS | Status: DC

## 2012-10-01 MED ORDER — METOPROLOL TARTRATE 25 MG PO TABS: 25.0000 mg | ORAL_TABLET | Freq: Two times a day (BID) | ORAL | Status: DC

## 2012-10-01 NOTE — Op Note (Signed)
NAMEMarland Kitchen  PIERCEN, EHRSAM NO.:  [id number]  MEDICAL RECORD NO.:  [id number]  LOCATION:  2905                         FACILITY:  [facility]  PHYSICIAN:  [name], MD DATE OF BIRTH:  07-19-1945  DATE OF PROCEDURE: DATE OF DISCHARGE:                              OPERATIVE REPORT   PROCEDURE:  Percutaneous tracheostomy.  PREOPERATIVE DIAGNOSIS:  Hypertensive crisis, status post cardiac arrest, failure to wean successfully and acute renal failure.  POSTOPERATIVE DIAGNOSIS:  Status post tracheostomy secondary to failure to wean successfully at the 2 week mark.  Consent was obtained.  The patient's wife fully aware of risks and benefits of the procedure including infection, bleeding, pneumothorax, and death.  BRONCHOSCOPIST FOR THE PROCEDURE:  Rigoberto Noel, MD.  DESCRIPTION OF PROCEDURE:  The patient was placed in a supine position. Chlorhexidine preparation was used to sterilize the operative site.  The bronchoscopist placed the bronchoscope through the endotracheal tube back to approximately 18 cm.  An 8 mL of lidocaine plus epinephrine was injected over the operative site.  A 1 cm vertical incision was made over the 2nd endotracheal space.  Slowly, dissection was made down to the tracheal plane.  Immediately, we noted a very large caliber 1-1.5 cm anterior jugular vein on the patient's right coursing, not directly within the field of tracheostomy placement.  I had some concerns over time that this could interrupt, to be agitated by the tracheostomy, so I placed 3-0 silk sutures from larger distance apart, but did not ligate the vessel __________ there was an issue with the tracheostomy causing irritation that he would have hemostasis.  After this, an 18-gauge needle was placed over white catheter sheath into the airway successfully noted by the bronchoscopist.  White catheter sheath remained.  The needle was removed.  Wire was placed through the  white catheter sheath and white catheter sheath was removed.  After the wires placed, noted some arterial oozing coming from the site.  Therefore, a portable cautery was used with good hemostasis.  A 14-French punch dilator was placed over the wire and removed.  A progressive rhino- dilator was placed over a glider to approximately 30-French and everything was removed except for the glider and wire.  A size 6 tracheostomy over glider and wire of 26-French placed into the airway successfully.  Everything was removed except for the tracheostomy. Bronchoscopist placed the bronchoscope through the new tracheostomy over the carina approximately 4 cm below without any bleeding whatsoever.  No posterior wall injury.  The tracheostomy was sutured in place with 4 monofilament sutures.  Blood loss for the procedure was approximately 15 mL.  The patient tolerated the procedure quite well with the need for paralytics of vecuronium, propofol, fentanyl and Versed and remained to be normotensive throughout.  Portable chest x-ray is pending at this time.     [name], MD     DJF/MEDQ  D:  09/30/2012  T:  10/01/2012  Job:  [id number]

## 2012-10-01 NOTE — Progress Notes (Signed)
Pt placed on wean by MD. Pt failed SBT of 5/5 due to high RR, but pt is tolerating 5/10 at this time. If able will place the pt on ATC later today. RT Will monitor.

## 2012-10-01 NOTE — Progress Notes (Signed)
ANTIBIOTIC CONSULT NOTE - FOLLOW UP  Pharmacy Consult:  Tressie Ellis Indication: rule out HCAP  Allergies  Allergen Reactions  . Hydrochlorothiazide     Gout , uncontrolled diabetes and renal insufficiency  . Allopurinol     Renal insufficiency    Patient Measurements: Height: 5\' 6"  (167.6 cm) Weight: 224 lb 13.9 oz (102 kg) IBW/kg (Calculated) : 63.8  Vital Signs: Temp: 98.9 F (37.2 C) (05/20 0400) Temp src: Oral (05/20 0400) BP: 171/78 mmHg (05/20 0800) Pulse Rate: 72 (05/20 0800) Intake/Output from previous day: 05/19 0701 - 05/20 0700 In: 1821.4 [I.V.:1096.4; NG/GT:675; IV Piggyback:50] Out: 1700 [Urine:1700]  Labs:  Recent Labs  09/29/12 0431 09/30/12 0500 10/01/12 0420  WBC  --  10.1 9.6  HGB  --  10.6* 10.8*  PLT  --  311 327  CREATININE 2.70* 2.26* 1.95*   Estimated Creatinine Clearance: 42.8 ml/min (by C-G formula based on Cr of 1.95). No results found for this basename: VANCOTROUGH, Corlis Leak, VANCORANDOM, [place], GENTPEAK, GENTRANDOM, [place], TOBRAPEAK, TOBRARND, AMIKACINPEAK, AMIKACINTROU, AMIKACIN,  in the last 72 hours   Microbiology: Recent Results (from the past 720 hour(s))  URINE CULTURE     Status: None   Collection Time    09/19/12  4:26 AM      Result Value Range Status   Specimen Description URINE, RANDOM   Final   Special Requests NONE   Final   Culture  Setup Time 09/19/2012 09:16   Final   Colony Count NO GROWTH   Final   Culture NO GROWTH   Final   Report Status 09/20/2012 FINAL   Final  MRSA PCR SCREENING     Status: None   Collection Time    09/19/12  6:19 AM      Result Value Range Status   MRSA by PCR NEGATIVE  NEGATIVE Final   Comment:            The GeneXpert MRSA Assay (FDA     approved for NASAL specimens     only), is one component of a     comprehensive MRSA colonization     surveillance program. It is not     intended to diagnose MRSA     infection nor to guide or     monitor treatment for     MRSA  infections.  CULTURE, BLOOD (ROUTINE X 2)     Status: None   Collection Time    09/20/12  9:50 AM      Result Value Range Status   Specimen Description BLOOD RIGHT HAND   Final   Special Requests BOTTLES DRAWN AEROBIC AND ANAEROBIC 10CC   Final   Culture  Setup Time 09/20/2012 14:17   Final   Culture NO GROWTH 5 DAYS   Final   Report Status 09/26/2012 FINAL   Final  CULTURE, BLOOD (ROUTINE X 2)     Status: None   Collection Time    09/20/12  9:55 AM      Result Value Range Status   Specimen Description BLOOD LEFT HAND   Final   Special Requests BOTTLES DRAWN AEROBIC AND ANAEROBIC 10CC   Final   Culture  Setup Time 09/20/2012 14:13   Final   Culture NO GROWTH 5 DAYS   Final   Report Status 09/26/2012 FINAL   Final  CULTURE, RESPIRATORY (NON-EXPECTORATED)     Status: None   Collection Time    09/21/12  3:20 PM      Result Value Range Status  Specimen Description TRACHEAL ASPIRATE   Final   Special Requests Normal   Final   Gram Stain     Final   Value: FEW WBC PRESENT, PREDOMINANTLY PMN     FEW SQUAMOUS EPITHELIAL CELLS PRESENT     FEW GRAM POSITIVE COCCI IN PAIRS     RARE GRAM NEGATIVE RODS   Culture Non-Pathogenic Oropharyngeal-type Flora Isolated.   Final   Report Status 09/24/2012 FINAL   Final  CULTURE, RESPIRATORY (NON-EXPECTORATED)     Status: None   Collection Time    09/22/12 12:45 PM      Result Value Range Status   Specimen Description TRACHEAL ASPIRATE   Final   Special Requests NONE   Final   Gram Stain     Final   Value: RARE WBC PRESENT, PREDOMINANTLY PMN     NO SQUAMOUS EPITHELIAL CELLS SEEN     NO ORGANISMS SEEN   Culture Non-Pathogenic Oropharyngeal-type Flora Isolated.   Final   Report Status 09/24/2012 FINAL   Final        Assessment: 65 YOM with LVH and diastolic dysfunction presented by EMS on 09/19/12 to the [facility] ED after he had sudden onset of respiratory failure at home.  He had a seizure and brief cardiac arrest requiring <43min of CPR.  Tressie Ellis  was started for rule out HCAP.  Patient's renal function is improving.  Tressie Ellis 5/14 >> Vanc 5/9>>5/12; restart 5/14>>5/16 Zosyn 5/9>>5/12 Rocephin 5/12 >> 5/14  5/9 BC x 2 >> negative 5/9 Urine >> negative 5/10 trach >> oral flora   Goal of Therapy:  Infection prevention / clearance of infeciton   Plan:  - Fortaz 1gm IV Q12H - Monitor renal function and adjust dosage as appropriate - Consider stopping Tressie Ellis as today is day#7 of therapy (day#12 of antibiotics) - F/U insulin adjustment for better glycemic control     Aleksandar Duve D. Mina Marble, PharmD, BCPS Pager:  [phone] 10/01/2012, 8:31 AM

## 2012-10-01 NOTE — Progress Notes (Signed)
Patient ID: SHAVEZ MCGARTY, male   DOB: 1945-09-27, 67 y.o.   MRN: [id number]   [facility] Progress Note    Subjective:   Tracheostomy done yesterday, currently awake and propped up in bed. Able to nod to questions/conversation.    Objective:   BP 239/103  Pulse 99  Temp(Src) 98.9 F (37.2 C) (Oral)  Resp 30  Ht 5\' 6"  (1.676 m)  Wt 102 kg (224 lb 13.9 oz)  BMI 36.31 kg/m2  SpO2 100%  Intake/Output Summary (Last 24 hours) at 10/01/12 0741 Last data filed at 10/01/12 0500  Gross per 24 hour  Intake 1821.35 ml  Output   1700 ml  Net 121.35 ml   Weight change: 0 kg (0 lb)  Physical Exam: Gen: Comfortable propped up in bed-vent via trach CVS: Pulse regular in rate and rhythm, heart sounds S1 and S2 normal Resp: Coarse breath sounds bilaterally-no rales/rhonchi Abd: Soft, obese, nontender and bowel sounds are normal Ext: One plus lower extremity edema  Imaging: Chest Portable 1 View To Assess Tube Placement And Rule-out Pneumothorax  09/30/2012   *RADIOLOGY REPORT*  Clinical Data: Status post tracheostomy insertion  PORTABLE CHEST - 1 VIEW  Comparison: 09/30/2012 5/7 hours  Findings: The endotracheal tube has been removed and a new tracheostomy tube placed in satisfactory position.  A left-sided central venous line and nasogastric catheter are again seen.  The cardiac shadow remains enlarged.  Increased density is noted in the left base stable from prior exam.  No new focal abnormality is noted.  No pneumothorax is seen.  IMPRESSION: Postsurgical changes as described.  No pneumothorax is noted. Stable changes in the left base are seen.   Original Report Authenticated By: Inez Catalina, M.D.   Dg Chest Port 1 View  09/30/2012   *RADIOLOGY REPORT*  Clinical Data: Follow up pulmonary edema.  PORTABLE CHEST - 1 VIEW  Comparison: 09/28/2012.  Findings: The support apparatus is stable.  The heart remains enlarged and there is a persistent left lower lobe process likely a  combination of effusion, atelectasis and infiltrate.  Stable eventration of the right hemidiaphragm with overlying atelectasis.  IMPRESSION:  1.  Stable support apparatus. 2.  Stable appearance of the heart and lungs.  Persistent left lower lobe process.   Original Report Authenticated By: Marijo Sanes, M.D.    Labs: BMET  Recent Labs Lab 09/25/12 0445 09/26/12 0400 09/27/12 0430 09/28/12 0349 09/29/12 0431 09/30/12 0500 10/01/12 0420  NA 150* 141 140 142 148* 148* 148*  K 3.6 3.4* 2.8* 3.9 3.9 3.5 3.6  CL 118* 110 112 111 116* 117* 117*  CO2 21 17* 15* 21 21 21  17*  GLUCOSE 149* 178* 166* 183* 219* 139* 222*  BUN 73* 74* 75* 90* 89* 82* 76*  CREATININE 3.09* 2.74* 2.53* 3.13* 2.70* 2.26* 1.95*  CALCIUM 9.3 9.2 8.0* 9.8 10.4 10.5 10.3  PHOS 3.8  --  3.9  --  4.1 3.6  --    CBC  Recent Labs Lab 09/26/12 0400 09/27/12 0430 09/30/12 0500 10/01/12 0420  WBC 5.3 6.4 10.1 9.6  NEUTROABS 3.6  --  7.3  --   HGB 9.6* 9.0* 10.6* 10.8*  HCT 30.1* 27.3* 33.2* 33.4*  MCV 81.6 80.3 80.6 81.1  PLT 207 190 311 327    Medications:    . amLODipine  10 mg Oral Daily  . antiseptic oral rinse  15 mL Mouth Rinse QID  . cefTAZidime (FORTAZ)  IV  1 g Intravenous Q12H  .  chlorhexidine  15 mL Mouth Rinse BID  . darbepoetin (ARANESP) injection - NON-DIALYSIS  100 mcg Subcutaneous Q Fri-1800  . etomidate  40 mg Intravenous Once  . feeding supplement (OSMOLITE 1.5 CAL)  1,000 mL Per Tube Q24H  . feeding supplement  60 mL Per Tube TID  . free water  200 mL Per Tube Q8H  . heparin subcutaneous  5,000 Units Subcutaneous Q8H  . hydrALAZINE  50 mg Oral Q8H  . insulin aspart  1-3 Units Subcutaneous Q4H  . insulin aspart  2 Units Subcutaneous Q4H  . insulin glargine  15 Units Subcutaneous Q24H  . isosorbide dinitrate  10 mg Per Tube TID  . metoprolol  2.5 mg Intravenous Q6H  . pantoprazole sodium  40 mg Per Tube QHS     Assessment/ Plan:   1. Acute on CRF- Baseline CKD with creatinine  between 1.5 and 2- now with Acute on CRF in the setting of respiratory failure/ arrest/variable BP-- renal function continues to show improvement with better UOP.No indications for HD at this time.  2. HTN/volume- BP remains elevated on hydralazine 50 mg 3 times a day, isosorbide 10 mg 3 times a day and amlodipine 10 mg daily. Will restart furosemide today given his volume status and consider scheduled higher dose beta blocker. 3. Anemia- dropping slowly with hosp- will continue to follow. S/p one dose aranesp  4. ID- per CCM-probable PNA with white out- s/p bronch- Now fortaz and vanc with better temp  5. VDRF- now status post tracheostomy-weaning per critical care. 6. Hypernatremia- continues to remain high- will increase free water  Elmarie Shiley, MD 10/01/2012, 7:41 AM

## 2012-10-01 NOTE — Progress Notes (Signed)
PULMONARY  / CRITICAL CARE MEDICINE  Name: Corey [name] MRN: [id number] DOB: 01-21-1946    ADMISSION DATE:  09/19/2012 CONSULTATION DATE:  09/19/2012  REFERRING MD :  Christy Gentles PRIMARY SERVICE: PCCM  CHIEF COMPLAINT:  Acute respiratory failure  BRIEF PATIENT DESCRIPTION: 67 y/o male with LVH and diastolic dysfunction presented by EMS on 5/8 to the [facility] ED after he had the sudden onset of respiratory failure at home.  He had a seizure and brief cardiac arrest requiring <42min of CPR.    SIGNIFICANT EVENTS / STUDIES:  5/8 CT head-peripheral and tentorial calcifications, increased since 2009 MRI 5/8 Echo- EF 55-60% 5/9: medical renal disease. No hydro 5/11 bronchoscopy-pending culture 5/13- peep to 5 5/15- lasix, even balance ,poor weaning 5/16- neg balance lasix  LINES / TUBES: 5/8 ETT >>5/19 5/19 tstomy #6 >>> 5/8 peripheral IV >> 5/10 LIJ >> 5/10 Art Cath>>out  CULTURES: Blood 5/9>>>ng UC 5/9>>>ng Sputum 5/10: NF 5/14 bronch bal>>>ng  ANTIBIOTICS: vanc 5/9>>>5/12, 5/14>>>5/16 Zosyn 5/9 (fever)>>>5/12 Ceftriaxone 5/12>>>5/13 Ceftazidime 5/14>>>5/18  SUBJECTIVE:  Afebrile BP remains labile No obvious pain, awake  on precedex  VITAL SIGNS: Temp:  [97.2 F (36.2 C)-98.9 F (37.2 C)] 98.9 F (37.2 C) (05/20 0400) Pulse Rate:  [46-104] 69 (05/20 0818) Resp:  [19-32] 31 (05/20 0818) BP: (149-240)/(73-151) 171/78 mmHg (05/20 0818) SpO2:  [96 %-100 %] 100 % (05/20 0818) FiO2 (%):  [40 %] 40 % (05/20 0818) Weight:  [102 kg (224 lb 13.9 oz)-103 kg (227 lb 1.2 oz)] 102 kg (224 lb 13.9 oz) (05/20 0400) HEMODYNAMICS:   VENTILATOR SETTINGS: Vent Mode:  [-] CPAP;PSV FiO2 (%):  [40 %] 40 % Set Rate:  [22 bmp] 22 bmp Vt Set:  [600 mL] 600 mL PEEP:  [5 cmH20] 5 cmH20 Pressure Support:  [10 cmH20] 10 cmH20 Plateau Pressure:  [18 cmH20-23 cmH20] 22 cmH20 INTAKE / OUTPUT: Intake/Output     05/19 0701 - 05/20 0700 05/20 0701 - 05/21 0700   I.V. (mL/kg) 1162.8 (11.4) 35.1 (0.3)    Other  10   NG/GT 725 55   IV Piggyback 50    Total Intake(mL/kg) 1937.8 (19) 100.1 (1)   Urine (mL/kg/hr) 1700 (0.7) 325 (2.1)   Stool     Total Output 1700 325   Net +237.8 -224.9         PHYSICAL EXAMINATION: Gen: easily arousable on precedex HEENT: NCAT, PERRL PULM: clear apical CV: RRR, distant heart  AB: BS+, soft, nontender, no hsm Ext: warm, trace pretibial edema reduced Derm: no rash or skin breakdown Neuro: rass 0, non focal   LABS:  Recent Labs Lab 09/29/12 0431 09/30/12 0500 10/01/12 0420  NA 148* 148* 148*  K 3.9 3.5 3.6  CL 116* 117* 117*  CO2 21 21 17*  BUN 89* 82* 76*  CREATININE 2.70* 2.26* 1.95*  GLUCOSE 219* 139* 222*    Recent Labs Lab 09/27/12 0430 09/30/12 0500 10/01/12 0420  HGB 9.0* 10.6* 10.8*  HCT 27.3* 33.2* 33.4*  WBC 6.4 10.1 9.6  PLT 190 311 327   ABG    Component Value Date/Time   PHART 7.277* 09/26/2012 1044   PCO2ART 40.0 09/26/2012 1044   PO2ART 76.0* 09/26/2012 1044   HCO3 18.5* 09/26/2012 1044   TCO2 20 09/26/2012 1044   ACIDBASEDEF 8.0* 09/26/2012 1044   O2SAT 92.0 09/26/2012 1044     Recent Labs Lab 09/26/12 0400 09/27/12 0430 09/30/12 0500 10/01/12 0420  WBC 5.3 6.4 10.1 9.6    Recent Labs  Lab 09/30/12 1203 09/30/12 1527 09/30/12 1931 09/30/12 2315 10/01/12 0405  GLUCAP 173* 181* 201* 184* 189*   5/19 - LLL atelectasis persists  ASSESSMENT / PLAN:  PULMONARY A: Acute respiratory failure due to pulmonary edema  LLL atelectasis. Possible mucous plugging. Required bronch 5/11, rpt 5/19 - clear airways P:   - aggressive wean to trach collar  CARDIOVASCULAR A: Acute on chronic CHF presumably due to hypertensive emergency Pericardial effusion, moderate, no tamponade on echo 5/8 Hypertension, wife reports occasional medication non-compliance PEA arrest (in setting of decomp HF) P:  -Increase metoprolol , ct hydralazine,Norvasc -Add hold HR parameters beta blocker -Added nitrates  RENAL A:   Acute on Chronic renal failure, CKD, at baseline Hypernatremia- wrosen crt with neg balance P:   -ct free water 250 q 6h Chem in am   GASTROINTESTINAL A:  No acute issues P:   - Protonix SUP - Continue TF  HEMATOLOGIC A: Anemia (due to CKD?), dilutional as well, increased hct P:  -continue [place] heparin coags now for trach  INFECTIOUS A: Possible aspiration PNA P:   dc ceftaz Obtain PICC & dc CVL  ENDOCRINE A:  UncontrolledDM2, on 70/30 35 units qHS at home P:   Increase TF coverage  NEUROLOGIC A:  Acute encephalopathy due to hypoxemia; improved mental status after intubation ?Seizure at home> currently mental status is OK, hypoxemia most likely explanation which has now resolved P:   -WUA  -chair position Ct  precedex  GLOBAL - Plan for LTAC  Care during the described time interval was provided by me and/or other providers on the critical care team.  I have reviewed this patient's available data, including medical history, events of note, physical examination and test results as part of my evaluation  CC time x 70m  Kara Mead MD. FCCP. [facility] Pager [phone] If no response call [phone]

## 2012-10-01 NOTE — Procedures (Signed)
Bronchoscopy Procedure Note Corey [name] [id number] 19-Nov-1945  Procedure: Bronchoscopy Indications: Diagnostic evaluation of the airways and Remove secretions & for percutaneous tracheostomy  Procedure Details Consent: Risks of procedure as well as the alternatives and risks of each were explained to the (patient/caregiver).  Consent for procedure obtained. Time Out: Verified patient identification, verified procedure, site/side was marked, verified correct patient position, special equipment/implants available, medications/allergies/relevent history reviewed, required imaging and test results available.  Performed  In preparation for procedure, patient was given 100% FiO2 and bronchoscope lubricated. Sedation: Benzodiazepines and Muscle relaxants  Airway entered and the following bronchi were examined: Bronchi.   Procedures performed: BAL performed Bronchoscope removed.  , Patient placed back on 100% FiO2 at conclusion of procedure.    Evaluation Hemodynamic Status: BP stable throughout; O2 sats: stable throughout Patient's Current Condition: stable Specimens:  Sent purulent fluid Complications: No apparent complications Patient did tolerate procedure well. Visualisation of posterior wall provided during tracheostomy proceure including initital puncture, guide wire & dilation.   ALVA,RAKESH V. 10/01/2012

## 2012-10-01 NOTE — Progress Notes (Signed)
Pt failed wean due to high RR And increased agitation. Pt placed back on full suppoert. RT Will monitor.

## 2012-10-02 ENCOUNTER — Inpatient Hospital Stay
Admission: AD | Admit: 2012-10-02 | Discharge: 2012-11-14 | Disposition: A | Discharge status: Home Health | Payer: No Typology Code available for payment source | Source: Ambulatory Visit | Attending: Internal Medicine | Admitting: Internal Medicine

## 2012-10-02 ENCOUNTER — Other Ambulatory Visit (HOSPITAL_COMMUNITY): Payer: Self-pay

## 2012-10-02 DIAGNOSIS — J9601 Acute respiratory failure with hypoxia: Secondary | ICD-10-CM | POA: Diagnosis present

## 2012-10-02 DIAGNOSIS — Z930 Tracheostomy status: Secondary | ICD-10-CM

## 2012-10-02 LAB — GLUCOSE, CAPILLARY
Glucose-Capillary: 113 mg/dL — ABNORMAL HIGH (ref 70–99)
Glucose-Capillary: 112 mg/dL — ABNORMAL HIGH (ref 70–99)
Glucose-Capillary: 151 mg/dL — ABNORMAL HIGH (ref 70–99)

## 2012-10-02 LAB — MAGNESIUM: Magnesium: 2.6 mg/dL — ABNORMAL HIGH (ref 1.5–2.5)

## 2012-10-02 LAB — CBC
HCT: 34.3 % — ABNORMAL LOW (ref 39.0–52.0)
Platelets: 333 10*3/uL (ref 150–400)
RDW: 14.7 % (ref 11.5–15.5)
WBC: 9.9 10*3/uL (ref 4.0–10.5)

## 2012-10-02 LAB — RENAL FUNCTION PANEL
Albumin: 2.6 g/dL — ABNORMAL LOW (ref 3.5–5.2)
GFR calc Af Amer: 39 mL/min — ABNORMAL LOW (ref >90)
GFR calc non Af Amer: 34 mL/min — ABNORMAL LOW (ref >90)
Phosphorus: 4.7 mg/dL — ABNORMAL HIGH (ref 2.3–4.6)
Potassium: 3.7 mEq/L (ref 3.5–5.1)
Sodium: 151 mEq/L — ABNORMAL HIGH (ref 135–145)

## 2012-10-02 MED ORDER — HEPARIN SODIUM (PORCINE) 5000 UNIT/ML IJ SOLN: 5000.0000 [IU] | Freq: Three times a day (TID) | INTRAMUSCULAR | Status: DC

## 2012-10-02 MED ORDER — FENTANYL CITRATE 0.05 MG/ML IJ SOLN: 25.0000 ug | INTRAMUSCULAR | Status: DC | PRN

## 2012-10-02 MED ORDER — FREE WATER: 250.0000 mL | Freq: Every day | Status: DC

## 2012-10-02 MED ORDER — MIDAZOLAM HCL 2 MG/2ML IJ SOLN: 2.0000 mg | INTRAMUSCULAR | Status: DC | PRN

## 2012-10-02 MED ORDER — PANTOPRAZOLE SODIUM 40 MG PO PACK: 40.0000 mg | PACK | Freq: Every day | ORAL | Status: DC

## 2012-10-02 MED ORDER — FUROSEMIDE 40 MG PO TABS: 40.0000 mg | ORAL_TABLET | Freq: Every day | ORAL | Status: DC

## 2012-10-02 MED ORDER — INSULIN GLARGINE 100 UNIT/ML ~~LOC~~ SOLN: 15.0000 [IU] | SUBCUTANEOUS | Status: DC

## 2012-10-02 MED ORDER — SODIUM CHLORIDE 0.9 % IV SOLN: 10.0000 mL | INTRAVENOUS | Status: DC

## 2012-10-02 MED ORDER — HYDRALAZINE HCL 50 MG PO TABS: 50.0000 mg | ORAL_TABLET | Freq: Three times a day (TID) | ORAL | Status: DC

## 2012-10-02 MED ORDER — CHLORHEXIDINE GLUCONATE 0.12 % MT SOLN: 15.0000 mL | Freq: Two times a day (BID) | OROMUCOSAL | Status: DC

## 2012-10-02 MED ORDER — PNEUMOCOCCAL VAC POLYVALENT 25 MCG/0.5ML IJ INJ
0.5000 mL | INJECTION | Freq: Once | INTRAMUSCULAR | Status: DC
  Filled 2012-10-02: qty 0.5

## 2012-10-02 MED ORDER — ISOSORBIDE DINITRATE 10 MG PO TABS: 10.0000 mg | ORAL_TABLET | Freq: Three times a day (TID) | ORAL | Status: DC

## 2012-10-02 MED ORDER — PRO-STAT SUGAR FREE PO LIQD: 60.0000 mL | Freq: Three times a day (TID) | ORAL | Status: DC

## 2012-10-02 MED ORDER — BIOTENE DRY MOUTH MT LIQD: 15.0000 mL | Freq: Four times a day (QID) | OROMUCOSAL | Status: DC

## 2012-10-02 MED ORDER — SODIUM BICARBONATE 8.4 % IV SOLN: 125.0000 mL/h | INTRAVENOUS | Status: DC

## 2012-10-02 MED ORDER — DEXTROSE 10 % IV SOLN: 40.0000 mL/h | INTRAVENOUS | Status: DC | PRN

## 2012-10-02 MED ORDER — INSULIN ASPART 100 UNIT/ML ~~LOC~~ SOLN: 5.0000 [IU] | SUBCUTANEOUS | Status: DC

## 2012-10-02 MED ORDER — HYDRALAZINE HCL 20 MG/ML IJ SOLN: 10.0000 mg | INTRAMUSCULAR | Status: DC | PRN

## 2012-10-02 MED ORDER — OSMOLITE 1.5 CAL PO LIQD: 1000.0000 mL | ORAL | Status: DC

## 2012-10-02 MED ORDER — METOPROLOL TARTRATE 50 MG PO TABS: 50.0000 mg | ORAL_TABLET | Freq: Two times a day (BID) | ORAL | Status: DC

## 2012-10-02 MED ORDER — MIDAZOLAM HCL 2 MG/2ML IJ SOLN
INTRAMUSCULAR | Status: AC
  Administered 2012-10-02: 2 mg via INTRAVENOUS
  Filled 2012-10-02: qty 2

## 2012-10-02 MED ORDER — INSULIN ASPART 100 UNIT/ML ~~LOC~~ SOLN: 1.0000 [IU] | SUBCUTANEOUS | Status: DC

## 2012-10-02 MED ORDER — SODIUM BICARBONATE 8.4 % IV SOLN
INTRAVENOUS | Status: DC
  Administered 2012-10-02: 09:00:00 via INTRAVENOUS
  Filled 2012-10-02 (×2): qty 50

## 2012-10-02 MED ORDER — DARBEPOETIN ALFA-POLYSORBATE 100 MCG/0.5ML IJ SOLN: 100.0000 ug | INTRAMUSCULAR | Status: DC

## 2012-10-02 NOTE — Progress Notes (Signed)
PULMONARY  / CRITICAL CARE MEDICINE  Name: Corey [name] MRN: [id number] DOB: [birth year]    ADMISSION DATE:  09/19/2012 CONSULTATION DATE:  09/19/2012  REFERRING MD :  Christy Gentles PRIMARY SERVICE: PCCM  CHIEF COMPLAINT:  Acute respiratory failure  BRIEF PATIENT DESCRIPTION: 67 y/o male with LVH and diastolic dysfunction presented by EMS on 5/8 to the [facility] ED after he had the sudden onset of respiratory failure at home.  He had a seizure and brief cardiac arrest requiring <62min of CPR.    SIGNIFICANT EVENTS / STUDIES:  5/8 CT head-peripheral and tentorial calcifications, increased since 2009 MRI 5/8 Echo- EF 55-60% 5/9: medical renal disease. No hydro 5/11 bronchoscopy-pending culture 5/13- peep to 5 5/15- lasix, even balance ,poor weaning 5/16- neg balance lasix  LINES / TUBES: 5/8 ETT >>5/19 5/19 tstomy #6 >>> 5/8 peripheral IV >> 5/10 LIJ >> 5/10 Art Cath>>out  CULTURES: Blood 5/9>>>ng UC 5/9>>>ng Sputum 5/10: NF 5/14 bronch bal>>>ng  ANTIBIOTICS: vanc 5/9>>>5/12, 5/14>>>5/16 Zosyn 5/9 (fever)>>>5/12 Ceftriaxone 5/12>>>5/13 Ceftazidime 5/14>>>5/18  SUBJECTIVE:  Afebrile BP remains labile No obvious pain, awake  on precedex  VITAL SIGNS: Temp:  [96.8 F (36 C)-98.4 F (36.9 C)] 97.1 F (36.2 C) (05/21 0400) Pulse Rate:  [50-84] 71 (05/21 0824) Resp:  [15-33] 30 (05/21 0824) BP: (123-210)/(63-104) 201/100 mmHg (05/21 0800) SpO2:  [96 %-100 %] 97 % (05/21 0824) FiO2 (%):  [35 %-40 %] 35 % (05/21 0824) Weight:  [101.9 kg (224 lb 10.4 oz)] 101.9 kg (224 lb 10.4 oz) (05/21 0400) HEMODYNAMICS:   VENTILATOR SETTINGS: Vent Mode:  [-] PRVC FiO2 (%):  [35 %-40 %] 35 % Set Rate:  [22 bmp] 22 bmp Vt Set:  [600 mL] 600 mL PEEP:  [5 cmH20] 5 cmH20 Plateau Pressure:  [17 cmH20-21 cmH20] 21 cmH20 INTAKE / OUTPUT: Intake/Output     05/20 0701 - 05/21 0700 05/21 0701 - 05/22 0700   I.V. (mL/kg) 798.3 (7.8) 33.2 (0.3)   Other 55    NG/GT 1285 25   IV Piggyback     Total  Intake(mL/kg) 2138.3 (21) 58.2 (0.6)   Urine (mL/kg/hr) 2165 (0.9) 100 (0.6)   Stool 180 (0.1)    Total Output 2345 100   Net -206.7 -41.8         PHYSICAL EXAMINATION: Gen: easily arousable on precedex HEENT: NCAT, PERRL PULM: clear apical CV: RRR, distant heart  AB: BS+, soft, nontender, no hsm Ext: warm, trace pretibial edema reduced Derm: no rash or skin breakdown Neuro: rass 0, non focal   LABS:  Recent Labs Lab 09/30/12 0500 10/01/12 0420 10/02/12 0440  NA 148* 148* 151*  K 3.5 3.6 3.7  CL 117* 117* 117*  CO2 21 17* 18*  BUN 82* 76* 75*  CREATININE 2.26* 1.95* 2.00*  GLUCOSE 139* 222* 98    Recent Labs Lab 09/30/12 0500 10/01/12 0420 10/02/12 0440  HGB 10.6* 10.8* 10.9*  HCT 33.2* 33.4* 34.3*  WBC 10.1 9.6 9.9  PLT 311 327 333   ABG    Component Value Date/Time   PHART 7.277* 09/26/2012 1044   PCO2ART 40.0 09/26/2012 1044   PO2ART 76.0* 09/26/2012 1044   HCO3 18.5* 09/26/2012 1044   TCO2 20 09/26/2012 1044   ACIDBASEDEF 8.0* 09/26/2012 1044   O2SAT 92.0 09/26/2012 1044     Recent Labs Lab 09/27/12 0430 09/30/12 0500 10/01/12 0420 10/02/12 0440  WBC 6.4 10.1 9.6 9.9    Recent Labs Lab 10/01/12 1637 10/01/12 1953 10/02/12 0038 10/02/12 0345  10/02/12 0737  GLUCAP 137* 151* 112* 113* 82   5/19 - LLL atelectasis persists  ASSESSMENT / PLAN:  PULMONARY A: Acute respiratory failure due to pulmonary edema  LLL atelectasis. Possible mucous plugging. Required bronch 5/11, rpt 5/19 - clear airways P:   -ct wean attempts to trach collar  CARDIOVASCULAR A: Acute on chronic CHF presumably due to hypertensive emergency Pericardial effusion, moderate, no tamponade on echo 5/8 Hypertension, wife reports occasional medication non-compliance PEA arrest (in setting of decomp HF) P:  -ct metoprolol , ct hydralazine,Norvasc, nitrates -Add hold HR parameters beta blocker   RENAL A:  Acute on Chronic renal failure, CKD, at  baseline Hypernatremia- wrosen crt with neg balance P:   -ct free water 250 q 6h Chem in am   GASTROINTESTINAL A:  No acute issues P:   - Protonix SUP - Continue TF  HEMATOLOGIC A: Anemia (due to CKD?), dilutional as well, increased hct P:  -continue [place] heparin coags now for trach  INFECTIOUS A: Possible aspiration PNA P:   dc ceftaz dc CVL if fever  ENDOCRINE A:  Uncontrolled DM2, on 70/30 35 units qHS at home P:   Increase TF coverage  NEUROLOGIC A:  Acute encephalopathy due to hypoxemia; improved mental status after intubation ?Seizure at home> currently mental status is OK, hypoxemia most likely explanation which has now resolved P:   -WUA  -chair position Ct  precedex  GLOBAL - Plan for LTAC  Care during the described time interval was provided by me and/or other providers on the critical care team.  I have reviewed this patient's available data, including medical history, events of note, physical examination and test results as part of my evaluation  CC time x 25m  Kara Mead MD. FCCP. [facility] Pager [phone] If no response call [phone]

## 2012-10-02 NOTE — Discharge Summary (Signed)
Physician Discharge Summary  Patient ID: Corey [name] MRN: [id number] DOB/AGE: [birth year] 67 y.o.  Admit date: 09/19/2012 Discharge date: 10/02/2012    Discharge Diagnoses:   Acute Respiratory Failure with hypoxia LLL Atelectasis  Questionable Aspiration Acute on Chronic CHF Pericardial Effusion Hypertension PEA Arrest Acute on Chronic Renal Failure Hypernatremia Anemia  Uncrontrolled Diabetes Acute Encephalopathy      Hospital Summary: Corey [name] is a 67 y.o. y/o male with a PMH of LVH and diastolic dysfunction presented by EMS on 5/8 to the [facility] ED after he had the sudden onset of respiratory failure at home. He had a seizure and brief cardiac arrest requiring <27min of CPR.          DISCHARGE INSTRUCTIONS BY DIAGNOSIS    Acute Respiratory Failure with hypoxia - status post tracheostomy on 5/19 in the setting of prolonged critical illness, PNA, and poor progress to wean from mechanical ventilation. LLL Atelectasis / Asp PNA - required bronchoscopy 5/11, repeat on 5/19 for mucous plugging.  5/19 noted clear airways.  Questionable Aspiration  Discharge Instructions: -vent wean protocol at [facility] -PRN BD's if needed -has completed abx, monitor fever curve / leukocytosis off antibiotic therapy  -f/u cxr in am -continue weaning on ATC as tolerated during day, recommend nocturnal vent support QHS for now -advance to PMV once tolerating ATC for greater than 4-5 hours and minimal secretions  Acute on Chronic CHF Pericardial Effusion Hypertension PEA Arrest  Discharge Instructions: -ct metoprolol , ct hydralazine,Norvasc, nitrates  -Add hold HR parameters beta blocker   Acute on Chronic Renal Failure - Baseline CKD with creatinine between 1.5 and 2- now with Acute on CRF in the setting of respiratory failure/ arrest/variable BP.  Medical renal disease noted on renal US, no hydronephrosis.  Hypernatremia - continues to be elevated, large amount of insensible  losses  Discharge Instructions: -continue free water as below -daily BMP for now -continue condom cath for I/O's -continue bicarb gtt for now  Anemia - hemoglobin improving s/p one dose aranesp  Discharge Instructions: -continue aranesp, follow H/H -Recommend Renal Consult -continue SQ Heparin for DVT proph  Uncrontrolled Diabetes  Discharge Instructions: -continue sliding scale insulin  -D5 or D10 if NPO  Protein Calorie Malnutrition   Discharge Instructions: -continue TF as below   Acute Encephalopathy - in setting of cardiac arrest, prolonged hospitalization.  Questionable isolated seizure episode prior to admit in setting of arrest.   Discharge Instructions: -continue aggressive PT efforts, supportive care -PRN versed / Fentanyl while on vent  CONSULTS Nephrology - Dr. Posey Pronto  SIGNIFICANT EVENTS / STUDIES:  5/08 - CT head-peripheral and tentorial calcifications, increased since 2009 MRI  5/08 - Echo- EF 55-60%  5/09 - medical renal disease. No hydro  5/11 - bronchoscopy-pending culture  5/13 - peep to 5  5/15 - lasix, even balance ,poor weaning  5/16 - neg balance lasix   LINES / TUBES:  5/8 ETT >>5/19  5/19 tstomy #6 >>>  5/8 peripheral IV >>  5/10 LIJ >>  5/10 Art Cath>>out   CULTURES:  Blood 5/9>>>ng  UC 5/9>>>ng  Sputum 5/10>> NF  5/14 bronch bal>>>ng   ANTIBIOTICS:  vanc 5/9>>>5/12, 5/14>>>5/16  Zosyn 5/9 (fever)>>>5/12  Ceftriaxone 5/12>>>5/13  Ceftazidime 5/14>>>5/18    Discharge Exam: Gen: easily arousable on precedex  HEENT: NCAT, PERRL  PULM: clear apical  CV: RRR, distant heart  AB: BS+, soft, nontender, no hsm  Ext: warm, trace pretibial edema reduced  Derm: no rash or skin breakdown  Neuro: rass 0, non focal    Filed Vitals:   10/02/12 0952 10/02/12 1000 10/02/12 1142 10/02/12 1200  BP: 201/86 169/67 160/76   Pulse: 77 71 65 77  Temp:    99.3 F (37.4 C)  TempSrc:      Resp:  36 28 26  Height:      Weight:       SpO2:  93% 97% 100%     Discharge Labs  BMET  Recent Labs Lab 09/27/12 0430 09/28/12 0349 09/29/12 0431 09/30/12 0500 10/01/12 0420 10/02/12 0440  NA 140 142 148* 148* 148* 151*  K 2.8* 3.9 3.9 3.5 3.6 3.7  CL 112 111 116* 117* 117* 117*  CO2 15* 21 21 21  17* 18*  GLUCOSE 166* 183* 219* 139* 222* 98  BUN 75* 90* 89* 82* 76* 75*  CREATININE 2.53* 3.13* 2.70* 2.26* 1.95* 2.00*  CALCIUM 8.0* 9.8 10.4 10.5 10.3 10.6*  MG  --   --   --   --   --  2.6*  PHOS 3.9  --  4.1 3.6  --  4.7*   CBC  Recent Labs Lab 09/30/12 0500 10/01/12 0420 10/02/12 0440  HGB 10.6* 10.8* 10.9*  HCT 33.2* 33.4* 34.3*  WBC 10.1 9.6 9.9  PLT 311 327 333   Anti-Coagulation  Recent Labs Lab 09/30/12 1313  INR 1.21        Discharge Orders   Future Orders Complete By Expires     Discharge instructions  As directed     Comments:      Leave all tubes / lines in place for discharge to [facility]    Increase activity slowly  As directed          Medication List    STOP taking these medications       carvedilol 25 MG tablet  Commonly known as:  COREG     colchicine 0.6 MG tablet     HUMALOG MIX 75/25 KWIKPEN (75-25) 100 UNIT/ML Susp  Generic drug:  insulin lispro protamine-lispro      TAKE these medications       amLODipine 10 MG tablet  Commonly known as:  NORVASC  Take 10 mg by mouth daily.     antiseptic oral rinse Liqd  15 mLs by Mouth Rinse route QID.     chlorhexidine 0.12 % solution  Commonly known as:  PERIDEX  Use as directed 15 mLs in the mouth or throat 2 (two) times daily.     darbepoetin 100 MCG/0.5ML Soln  Commonly known as:  ARANESP  Inject 0.5 mLs (100 mcg total) into the skin every Friday at 6 PM.     dextrose 10 % infusion  Inject 40 mL/hr into the vein continuous as needed (ONLY if tube feeding (TF) discontinued).     dextrose 5 % SOLN 950 mL with sodium bicarbonate 1 mEq/mL SOLN 50 mEq  Inject 125 mL/hr into the vein continuous.      feeding supplement (OSMOLITE 1.5 CAL) Liqd  Place 1,000 mLs into feeding tube daily.     feeding supplement Liqd  Place 60 mLs into feeding tube 3 (three) times daily.     fentaNYL 0.05 MG/ML injection  Commonly known as:  SUBLIMAZE  Inject 0.5-1 mLs (25-50 mcg total) into the vein every 2 (two) hours as needed for severe pain (pain).     free water Soln  Place 250 mLs into feeding tube 5 (five) times daily.  furosemide 40 MG tablet  Commonly known as:  LASIX  Place 1 tablet (40 mg total) into feeding tube daily.     heparin 5000 UNIT/ML injection  Inject 1 mL (5,000 Units total) into the skin every 8 (eight) hours.     hydrALAZINE 50 MG tablet  Commonly known as:  APRESOLINE  Take 1 tablet (50 mg total) by mouth every 8 (eight) hours.     hydrALAZINE 20 MG/ML injection  Commonly known as:  APRESOLINE  Inject 0.5 mLs (10 mg total) into the vein every 4 (four) hours as needed (SBP above 180).     insulin aspart 100 UNIT/ML injection  Commonly known as:  novoLOG  Inject 1-3 Units into the skin every 4 (four) hours.     insulin aspart 100 UNIT/ML injection  Commonly known as:  NOVOLOG  Inject 5 Units into the skin every 4 (four) hours.     insulin glargine 100 UNIT/ML injection  Commonly known as:  LANTUS  Inject 0.15 mLs (15 Units total) into the skin daily.     isosorbide dinitrate 10 MG tablet  Commonly known as:  ISORDIL  Place 1 tablet (10 mg total) into feeding tube 3 (three) times daily.     metoprolol 50 MG tablet  Commonly known as:  LOPRESSOR  Place 1 tablet (50 mg total) into feeding tube 2 (two) times daily.     midazolam 2 MG/2ML Soln injection  Commonly known as:  VERSED  Inject 2-4 mLs (2-4 mg total) into the vein every 2 (two) hours as needed.     pantoprazole sodium 40 mg/20 mL Pack  Commonly known as:  PROTONIX  Place 20 mLs (40 mg total) into feeding tube at bedtime.     sodium chloride 0.9 % infusion  Inject 10 mLs into the vein  continuous.          Disposition:  Discharger to [facility] for further rehab efforts.   Discharged Condition: Corey [name] has met maximum benefit of inpatient care and is medically stable and cleared for discharge.  Patient is pending follow up as above.      Time spent on disposition:  Greater than 35 minutes.   Signed: Noe Gens, NP-C [facility] Pgr: [phone]    Care during the described time interval was provided by me and/or other providers on the critical care team.  I have reviewed this patient's available data, including medical history, events of note, physical examination and test results as part of my evaluation Corey [name],Corey V.

## 2012-10-02 NOTE — Progress Notes (Signed)
[facility] Team Note Patient Details Name: Corey [name] MRN: [id number] DOB: 1946/01/07 Today's Date: 10/02/2012 Time:  -     This SLP spoke with pt's RN as part of the trach team.  Brief weaning attempts yesterday.  Today he has been weaning to ATC for approximately one hour per RN.  SLP will continue to follow and may recommend speaking valve if appropriate and pt. Is able to tolerate trach mask for greater than 4-5 hours  Orbie Pyo [employer] [phone]  10/02/2012

## 2012-10-02 NOTE — Progress Notes (Signed)
Patient ID: Corey [name], male   DOB: March 10, 1946, 67 y.o.   MRN: [id number]   [facility] Progress Note    Subjective:   Difficulties with weaning attempts noted from yesterday.    Objective:   BP 145/66  Pulse 58  Temp(Src) 97.1 F (36.2 C) (Core (Comment))  Resp 22  Ht 5\' 6"  (1.676 m)  Wt 101.9 kg (224 lb 10.4 oz)  BMI 36.28 kg/m2  SpO2 96%  Intake/Output Summary (Last 24 hours) at 10/02/12 0746 Last data filed at 10/02/12 0600  Gross per 24 hour  Intake 2080.11 ml  Output   2345 ml  Net -264.89 ml   Weight change: -1.1 kg (-2 lb 6.8 oz)  Physical Exam: Gen: Awake and alert in bed-on vent via trach CVS: Pulse regular bradycardia, normal S1 and S2 Resp: Coarse breath sounds bilaterally-no rales/rhonchi Abd: Soft, obese, nontender and bowel sounds are normal Ext: 2+ lower extremity edema  Imaging: Chest Portable 1 View To Assess Tube Placement And Rule-out Pneumothorax  09/30/2012   *RADIOLOGY REPORT*  Clinical Data: Status post tracheostomy insertion  PORTABLE CHEST - 1 VIEW  Comparison: 09/30/2012 5/7 hours  Findings: The endotracheal tube has been removed and a new tracheostomy tube placed in satisfactory position.  A left-sided central venous line and nasogastric catheter are again seen.  The cardiac shadow remains enlarged.  Increased density is noted in the left base stable from prior exam.  No new focal abnormality is noted.  No pneumothorax is seen.  IMPRESSION: Postsurgical changes as described.  No pneumothorax is noted. Stable changes in the left base are seen.   Original Report Authenticated By: Inez Catalina, M.D.   Dg Abd Portable 1v  10/01/2012   *RADIOLOGY REPORT*  Clinical Data: NG tube placement.  PORTABLE ABDOMEN - 1 VIEW  Comparison: None.  Findings: NG tube tip is in the fundus of the stomach.  Bowel gas pattern is normal.  IMPRESSION: NG tube tip in the fundus of the stomach.   Original Report Authenticated By: Lorriane Shire, M.D.     Labs: BMET  Recent Labs Lab 09/26/12 0400 09/27/12 0430 09/28/12 0349 09/29/12 0431 09/30/12 0500 10/01/12 0420 10/02/12 0440  NA 141 140 142 148* 148* 148* 151*  K 3.4* 2.8* 3.9 3.9 3.5 3.6 3.7  CL 110 112 111 116* 117* 117* 117*  CO2 17* 15* 21 21 21  17* 18*  GLUCOSE 178* 166* 183* 219* 139* 222* 98  BUN 74* 75* 90* 89* 82* 76* 75*  CREATININE 2.74* 2.53* 3.13* 2.70* 2.26* 1.95* 2.00*  CALCIUM 9.2 8.0* 9.8 10.4 10.5 10.3 10.6*  PHOS  --  3.9  --  4.1 3.6  --  4.7*   CBC  Recent Labs Lab 09/26/12 0400 09/27/12 0430 09/30/12 0500 10/01/12 0420 10/02/12 0440  WBC 5.3 6.4 10.1 9.6 9.9  NEUTROABS 3.6  --  7.3  --   --   HGB 9.6* 9.0* 10.6* 10.8* 10.9*  HCT 30.1* 27.3* 33.2* 33.4* 34.3*  MCV 81.6 80.3 80.6 81.1 81.3  PLT 207 190 311 327 333    Medications:    . amLODipine  10 mg Oral Daily  . antiseptic oral rinse  15 mL Mouth Rinse QID  . chlorhexidine  15 mL Mouth Rinse BID  . darbepoetin (ARANESP) injection - NON-DIALYSIS  100 mcg Subcutaneous Q Fri-1800  . feeding supplement (OSMOLITE 1.5 CAL)  1,000 mL Per Tube Q24H  . feeding supplement  60 mL Per Tube TID  .  free water  250 mL Per Tube 5 X Daily  . furosemide  40 mg Per Tube Daily  . heparin subcutaneous  5,000 Units Subcutaneous Q8H  . hydrALAZINE  50 mg Oral Q8H  . insulin aspart  1-3 Units Subcutaneous Q4H  . insulin aspart  5 Units Subcutaneous Q4H  . insulin glargine  15 Units Subcutaneous Q24H  . isosorbide dinitrate  10 mg Per Tube TID  . metoprolol tartrate  50 mg Per NG tube BID  . pantoprazole sodium  40 mg Per Tube QHS     Assessment/ Plan:   1. Acute on CRF- Baseline CKD with creatinine between 1.5 and 2- now with Acute on CRF in the setting of respiratory failure/ arrest/variable BP-- renal function stable since yesterday with better UOP in response to furosemide. No indications for HD/intervention for electrolytes at this time.  2. HTN/volume- BP remains elevated however improving  trend noted with increased beta blocker and restarting diuretics. Given his prohibitive pulse-may need to up titrate hydralazine soon.  3. Anemia- hemoglobin improving S/p one dose aranesp-we'll continue to follow trend  4. ID- per CCM-probable PNA with white out- s/p bronch- antibiotic course completed-status post vancomycin and ceftazidime. 5. VDRF- now status post tracheostomy-weaning per critical care.  6. Hypernatremia- continues to remain high, large amount of insensible losses- will increase free water   Elmarie Shiley, MD 10/02/2012, 7:46 AM

## 2012-10-03 ENCOUNTER — Other Ambulatory Visit (HOSPITAL_COMMUNITY): Payer: Self-pay

## 2012-10-03 LAB — URINE MICROSCOPIC-ADD ON

## 2012-10-03 LAB — COMPREHENSIVE METABOLIC PANEL
Albumin: 3.1 g/dL — ABNORMAL LOW (ref 3.5–5.2)
Alkaline Phosphatase: 244 U/L — ABNORMAL HIGH (ref 39–117)
BUN: 65 mg/dL — ABNORMAL HIGH (ref 6–23)
Calcium: 10.6 mg/dL — ABNORMAL HIGH (ref 8.4–10.5)
Potassium: 3.6 mEq/L (ref 3.5–5.1)
Sodium: 153 mEq/L — ABNORMAL HIGH (ref 135–145)
Total Protein: 8 g/dL (ref 6.0–8.3)

## 2012-10-03 LAB — URINALYSIS, ROUTINE W REFLEX MICROSCOPIC
Bilirubin Urine: NEGATIVE
Glucose, UA: NEGATIVE mg/dL
Ketones, ur: NEGATIVE mg/dL
Leukocytes, UA: NEGATIVE
Nitrite: NEGATIVE
Protein, ur: 30 mg/dL — AB
Specific Gravity, Urine: 1.017 (ref 1.005–1.030)
Urobilinogen, UA: 0.2 mg/dL (ref 0.0–1.0)
pH: 5 (ref 5.0–8.0)

## 2012-10-03 LAB — TSH: TSH: 1.867 u[IU]/mL (ref 0.350–4.500)

## 2012-10-03 LAB — URINE CULTURE
Colony Count: NO GROWTH
Culture: NO GROWTH

## 2012-10-03 LAB — PROCALCITONIN: Procalcitonin: 0.63 ng/mL

## 2012-10-03 LAB — CBC
MCH: 26 pg (ref 26.0–34.0)
MCHC: 31.3 g/dL (ref 30.0–36.0)
RDW: 14.9 % (ref 11.5–15.5)

## 2012-10-04 LAB — BASIC METABOLIC PANEL
BUN: 60 mg/dL — ABNORMAL HIGH (ref 6–23)
Calcium: 10.5 mg/dL (ref 8.4–10.5)
GFR calc Af Amer: 36 mL/min — ABNORMAL LOW (ref >90)
GFR calc non Af Amer: 31 mL/min — ABNORMAL LOW (ref >90)
Glucose, Bld: 154 mg/dL — ABNORMAL HIGH (ref 70–99)

## 2012-10-04 LAB — CBC
HCT: 32.5 % — ABNORMAL LOW (ref 39.0–52.0)
Hemoglobin: 10.2 g/dL — ABNORMAL LOW (ref 13.0–17.0)
MCH: 26 pg (ref 26.0–34.0)
MCHC: 31.4 g/dL (ref 30.0–36.0)
RDW: 15 % (ref 11.5–15.5)

## 2012-10-05 LAB — CULTURE, RESPIRATORY W GRAM STAIN

## 2012-10-06 ENCOUNTER — Encounter: Payer: Self-pay | Admitting: Radiology

## 2012-10-06 DIAGNOSIS — M7989 Other specified soft tissue disorders: Secondary | ICD-10-CM

## 2012-10-06 LAB — BASIC METABOLIC PANEL
Calcium: 9.8 mg/dL (ref 8.4–10.5)
Creatinine, Ser: 2.07 mg/dL — ABNORMAL HIGH (ref 0.50–1.35)
GFR calc Af Amer: 37 mL/min — ABNORMAL LOW (ref >90)
GFR calc non Af Amer: 32 mL/min — ABNORMAL LOW (ref >90)

## 2012-10-06 LAB — CBC
MCH: 25.9 pg — ABNORMAL LOW (ref 26.0–34.0)
MCHC: 31.8 g/dL (ref 30.0–36.0)
MCV: 81.3 fL (ref 78.0–100.0)
Platelets: 290 10*3/uL (ref 150–400)
RDW: 14.4 % (ref 11.5–15.5)

## 2012-10-06 LAB — MAGNESIUM: Magnesium: 2.3 mg/dL (ref 1.5–2.5)

## 2012-10-06 NOTE — Progress Notes (Signed)
VASCULAR LAB PRELIMINARY  PRELIMINARY  PRELIMINARY  PRELIMINARY  Right upper extremity venous Doppler completed.    Preliminary report:  There is no DVT or SVT noted in the right upper extremity.  Aubryana Vittorio, RVT 10/06/2012, 3:27 PM

## 2012-10-06 NOTE — H&P (Signed)
Corey [name] is an 67 y.o. male.   Chief Complaint: Malnutrition Encephalopathy; aspiration PNA Scheduled for percutaneous gastric tube 5/27 HPI: CRI; DM; HTN; ARF; CAD; CHF; trach  Past Medical History  Diagnosis Date  . Renal insufficiency   . Diabetes mellitus   . Hypertension   . Gout   . Colon polyp 2003    Dr Corey [name]; F/U was to be 2008( not completed)  . Diverticulosis 2003  . Pneumonia 09/29/11    Avelox X 10 daysas OP    Past Surgical History  Procedure Laterality Date  . Refractive surgery  2005  . Colonoscopy w/ polypectomy  2003  . Wrist aspiration  02/16/12     monosodium urate crystals; Dr Corey [name]    Family History  Problem Relation Age of Onset  . Hypertension Father   . Hypertension Mother   . Cancer Father     prostate  . Diabetes Mother   . Diabetes Father    Social History:  reports that he quit smoking about 44 years ago. He does not have any smokeless tobacco history on file. He reports that he drinks about 1.2 ounces of alcohol per week. He reports that he does not use illicit drugs.  Allergies:  Allergies  Allergen Reactions  . Hydrochlorothiazide     Gout , uncontrolled diabetes and renal insufficiency  . Allopurinol     Renal insufficiency    Medications Prior to Admission  Medication Sig Dispense Refill  . amLODipine (NORVASC) 10 MG tablet Take 10 mg by mouth daily.      Marland Kitchen antiseptic oral rinse (BIOTENE) LIQD 15 mLs by Mouth Rinse route QID.      Marland Kitchen chlorhexidine (PERIDEX) 0.12 % solution Use as directed 15 mLs in the mouth or throat 2 (two) times daily.  120 mL  0  . darbepoetin (ARANESP) 100 MCG/0.5ML SOLN Inject 0.5 mLs (100 mcg total) into the skin every Friday at 6 PM.  4.2 mL    . dextrose 10 % infusion Inject 40 mL/hr into the vein continuous as needed (ONLY if tube feeding (TF) discontinued).  500 mL    . dextrose 5 % SOLN 950 mL with sodium bicarbonate 1 mEq/mL SOLN 50 mEq Inject 125 mL/hr into the vein continuous.    0  .  feeding supplement (PRO-STAT SUGAR FREE 64) LIQD Place 60 mLs into feeding tube 3 (three) times daily.  900 mL  0  . fentaNYL (SUBLIMAZE) 0.05 MG/ML injection Inject 0.5-1 mLs (25-50 mcg total) into the vein every 2 (two) hours as needed for severe pain (pain).  2 mL  0  . furosemide (LASIX) 40 MG tablet Place 1 tablet (40 mg total) into feeding tube daily.  30 tablet    . heparin 5000 UNIT/ML injection Inject 1 mL (5,000 Units total) into the skin every 8 (eight) hours.  1 mL    . hydrALAZINE (APRESOLINE) 20 MG/ML injection Inject 0.5 mLs (10 mg total) into the vein every 4 (four) hours as needed (SBP above 180).  1 mL    . hydrALAZINE (APRESOLINE) 50 MG tablet Take 1 tablet (50 mg total) by mouth every 8 (eight) hours.      . insulin aspart (NOVOLOG) 100 UNIT/ML injection Inject 1-3 Units into the skin every 4 (four) hours.  1 vial  12  . insulin aspart (NOVOLOG) 100 UNIT/ML injection Inject 5 Units into the skin every 4 (four) hours.  1 vial  12  . insulin glargine (LANTUS)  100 UNIT/ML injection Inject 0.15 mLs (15 Units total) into the skin daily.  10 mL  12  . isosorbide dinitrate (ISORDIL) 10 MG tablet Place 1 tablet (10 mg total) into feeding tube 3 (three) times daily.      . metoprolol (LOPRESSOR) 50 MG tablet Place 1 tablet (50 mg total) into feeding tube 2 (two) times daily.      . midazolam (VERSED) 2 MG/2ML SOLN injection Inject 2-4 mLs (2-4 mg total) into the vein every 2 (two) hours as needed.  2.5 mL  0  . Nutritional Supplements (FEEDING SUPPLEMENT, OSMOLITE 1.5 CAL,) LIQD Place 1,000 mLs into feeding tube daily.    0  . pantoprazole sodium (PROTONIX) 40 mg/20 mL PACK Place 20 mLs (40 mg total) into feeding tube at bedtime.  30 each    . sodium chloride 0.9 % infusion Inject 10 mLs into the vein continuous.    0  . Water For Irrigation, Sterile (FREE WATER) SOLN Place 250 mLs into feeding tube 5 (five) times daily.        Results for orders placed during the hospital encounter of  10/02/12 (from the past 48 hour(s))  VANCOMYCIN, TROUGH     Status: Abnormal   Collection Time    10/05/12  1:00 PM      Result Value Range   Vancomycin Tr 21.7 (*) 10.0 - 20.0 ug/mL   No results found.  Review of Systems  Constitutional: Negative for fever.  Respiratory: Positive for sputum production.   Neurological: Positive for weakness.    There were no vitals taken for this visit. Physical Exam  Constitutional: He appears well-developed and well-nourished.  Cardiovascular: Normal rate, regular rhythm and normal heart sounds.   No murmur heard. Respiratory: Effort normal. He has wheezes.  trach  GI: Soft. Bowel sounds are normal. There is no tenderness.  Musculoskeletal:  Moves all 4s  Neurological:  Communicates with nods  Skin: Skin is warm and dry.     Assessment/Plan Aspiration; protein calorie malnutrition Trach Scheduled for g tube in IR 5/27 On vanco Check kub Barium ordered Hep inj held  Corey [name] 10/06/2012, 9:57 AM

## 2012-10-07 ENCOUNTER — Other Ambulatory Visit (HOSPITAL_COMMUNITY): Payer: Self-pay

## 2012-10-08 ENCOUNTER — Other Ambulatory Visit (HOSPITAL_COMMUNITY): Payer: Self-pay

## 2012-10-08 LAB — RENAL FUNCTION PANEL
BUN: 51 mg/dL — ABNORMAL HIGH (ref 6–23)
CO2: 24 mEq/L (ref 19–32)
Calcium: 9.9 mg/dL (ref 8.4–10.5)
Creatinine, Ser: 1.92 mg/dL — ABNORMAL HIGH (ref 0.50–1.35)
GFR calc Af Amer: 41 mL/min — ABNORMAL LOW (ref >90)
Glucose, Bld: 96 mg/dL (ref 70–99)
Sodium: 147 mEq/L — ABNORMAL HIGH (ref 135–145)

## 2012-10-08 LAB — CBC
HCT: 26.6 % — ABNORMAL LOW (ref 39.0–52.0)
Hemoglobin: 8.7 g/dL — ABNORMAL LOW (ref 13.0–17.0)
MCHC: 32.7 g/dL (ref 30.0–36.0)

## 2012-10-08 MED ORDER — MIDAZOLAM HCL 2 MG/2ML IJ SOLN
INTRAMUSCULAR | Status: AC | PRN
  Administered 2012-10-08 (×2): 0.5 mg via INTRAVENOUS
  Administered 2012-10-08: 1 mg via INTRAVENOUS

## 2012-10-08 MED ORDER — IOHEXOL 300 MG/ML  SOLN
50.0000 mL | Freq: Once | INTRAMUSCULAR | Status: AC | PRN
  Administered 2012-10-08: 15 mL

## 2012-10-08 MED ORDER — FENTANYL CITRATE 0.05 MG/ML IJ SOLN
INTRAMUSCULAR | Status: AC | PRN
  Administered 2012-10-08 (×4): 25 ug via INTRAVENOUS

## 2012-10-08 NOTE — Procedures (Signed)
Procedure:  Gastrostomy Findings:  17 Fr bumper retention gastrostomy tube placed with tip in body of stomach.

## 2012-10-09 LAB — BASIC METABOLIC PANEL
CO2: 24 mEq/L (ref 19–32)
GFR calc non Af Amer: 35 mL/min — ABNORMAL LOW (ref >90)
Glucose, Bld: 98 mg/dL (ref 70–99)
Potassium: 3.6 mEq/L (ref 3.5–5.1)
Sodium: 149 mEq/L — ABNORMAL HIGH (ref 135–145)

## 2012-10-09 LAB — CULTURE, BLOOD (ROUTINE X 2): Culture: NO GROWTH

## 2012-10-10 LAB — RENAL FUNCTION PANEL
Albumin: 2.5 g/dL — ABNORMAL LOW (ref 3.5–5.2)
CO2: 20 mEq/L (ref 19–32)
Calcium: 10 mg/dL (ref 8.4–10.5)
Chloride: 113 mEq/L — ABNORMAL HIGH (ref 96–112)
GFR calc Af Amer: 34 mL/min — ABNORMAL LOW (ref >90)
GFR calc non Af Amer: 29 mL/min — ABNORMAL LOW (ref >90)
Sodium: 148 mEq/L — ABNORMAL HIGH (ref 135–145)

## 2012-10-10 LAB — CULTURE, BLOOD (ROUTINE X 2): Culture: NO GROWTH

## 2012-10-10 LAB — HEMOGLOBIN AND HEMATOCRIT, BLOOD: HCT: 27.6 % — ABNORMAL LOW (ref 39.0–52.0)

## 2012-10-11 LAB — BASIC METABOLIC PANEL
BUN: 47 mg/dL — ABNORMAL HIGH (ref 6–23)
Calcium: 10.1 mg/dL (ref 8.4–10.5)
GFR calc Af Amer: 30 mL/min — ABNORMAL LOW (ref >90)
GFR calc non Af Amer: 26 mL/min — ABNORMAL LOW (ref >90)
Potassium: 3.5 mEq/L (ref 3.5–5.1)

## 2012-10-12 LAB — BASIC METABOLIC PANEL
BUN: 45 mg/dL — ABNORMAL HIGH (ref 6–23)
Calcium: 11.2 mg/dL — ABNORMAL HIGH (ref 8.4–10.5)
Creatinine, Ser: 2.66 mg/dL — ABNORMAL HIGH (ref 0.50–1.35)
GFR calc Af Amer: 28 mL/min — ABNORMAL LOW (ref >90)
GFR calc non Af Amer: 24 mL/min — ABNORMAL LOW (ref >90)
Potassium: 3.9 mEq/L (ref 3.5–5.1)

## 2012-10-13 LAB — BASIC METABOLIC PANEL
BUN: 51 mg/dL — ABNORMAL HIGH (ref 6–23)
Calcium: 10.2 mg/dL (ref 8.4–10.5)
Chloride: 108 mEq/L (ref 96–112)
Creatinine, Ser: 3.31 mg/dL — ABNORMAL HIGH (ref 0.50–1.35)
GFR calc Af Amer: 21 mL/min — ABNORMAL LOW (ref >90)
GFR calc non Af Amer: 18 mL/min — ABNORMAL LOW (ref >90)

## 2012-10-14 ENCOUNTER — Other Ambulatory Visit (HOSPITAL_COMMUNITY): Payer: Self-pay

## 2012-10-14 LAB — COMPREHENSIVE METABOLIC PANEL
AST: 44 U/L — ABNORMAL HIGH (ref 0–37)
Albumin: 2.9 g/dL — ABNORMAL LOW (ref 3.5–5.2)
Alkaline Phosphatase: 161 U/L — ABNORMAL HIGH (ref 39–117)
Chloride: 104 mEq/L (ref 96–112)
Potassium: 4.7 mEq/L (ref 3.5–5.1)
Total Bilirubin: 0.5 mg/dL (ref 0.3–1.2)
Total Protein: 7.7 g/dL (ref 6.0–8.3)

## 2012-10-14 LAB — CBC
MCHC: 31.4 g/dL (ref 30.0–36.0)
Platelets: 266 10*3/uL (ref 150–400)
RDW: 14.3 % (ref 11.5–15.5)
WBC: 6.8 10*3/uL (ref 4.0–10.5)

## 2012-10-14 LAB — PRO B NATRIURETIC PEPTIDE: Pro B Natriuretic peptide (BNP): 2879 pg/mL — ABNORMAL HIGH (ref 0–125)

## 2012-10-15 LAB — BASIC METABOLIC PANEL
CO2: 22 mEq/L (ref 19–32)
Calcium: 10.4 mg/dL (ref 8.4–10.5)
Chloride: 101 mEq/L (ref 96–112)
Creatinine, Ser: 3.99 mg/dL — ABNORMAL HIGH (ref 0.50–1.35)
Glucose, Bld: 186 mg/dL — ABNORMAL HIGH (ref 70–99)
Sodium: 137 mEq/L (ref 135–145)

## 2012-10-16 ENCOUNTER — Other Ambulatory Visit (HOSPITAL_COMMUNITY): Payer: Self-pay

## 2012-10-16 LAB — CBC
HCT: 29.1 % — ABNORMAL LOW (ref 39.0–52.0)
MCH: 25.4 pg — ABNORMAL LOW (ref 26.0–34.0)
MCV: 79.5 fL (ref 78.0–100.0)
Platelets: 272 10*3/uL (ref 150–400)
RBC: 3.66 MIL/uL — ABNORMAL LOW (ref 4.22–5.81)
WBC: 6.2 10*3/uL (ref 4.0–10.5)

## 2012-10-16 LAB — COMPREHENSIVE METABOLIC PANEL
BUN: 55 mg/dL — ABNORMAL HIGH (ref 6–23)
CO2: 17 mEq/L — ABNORMAL LOW (ref 19–32)
Calcium: 10.2 mg/dL (ref 8.4–10.5)
Chloride: 107 mEq/L (ref 96–112)
Creatinine, Ser: 3.88 mg/dL — ABNORMAL HIGH (ref 0.50–1.35)
GFR calc Af Amer: 17 mL/min — ABNORMAL LOW (ref >90)
GFR calc non Af Amer: 15 mL/min — ABNORMAL LOW (ref >90)
Total Bilirubin: 0.4 mg/dL (ref 0.3–1.2)

## 2012-10-16 LAB — POTASSIUM: Potassium: 4.8 mEq/L (ref 3.5–5.1)

## 2012-10-17 LAB — BASIC METABOLIC PANEL
Calcium: 10.3 mg/dL (ref 8.4–10.5)
GFR calc Af Amer: 19 mL/min — ABNORMAL LOW (ref >90)
GFR calc non Af Amer: 16 mL/min — ABNORMAL LOW (ref >90)
Potassium: 4.6 mEq/L (ref 3.5–5.1)
Sodium: 145 mEq/L (ref 135–145)

## 2012-10-18 ENCOUNTER — Other Ambulatory Visit (HOSPITAL_COMMUNITY): Payer: Self-pay

## 2012-10-18 LAB — BASIC METABOLIC PANEL
BUN: 56 mg/dL — ABNORMAL HIGH (ref 6–23)
CO2: 24 mEq/L (ref 19–32)
Chloride: 109 mEq/L (ref 96–112)
GFR calc non Af Amer: 15 mL/min — ABNORMAL LOW (ref >90)
Glucose, Bld: 186 mg/dL — ABNORMAL HIGH (ref 70–99)
Potassium: 4.3 mEq/L (ref 3.5–5.1)

## 2012-10-18 LAB — CBC
HCT: 28.5 % — ABNORMAL LOW (ref 39.0–52.0)
Hemoglobin: 9 g/dL — ABNORMAL LOW (ref 13.0–17.0)
MCHC: 31.6 g/dL (ref 30.0–36.0)
RBC: 3.55 MIL/uL — ABNORMAL LOW (ref 4.22–5.81)

## 2012-10-20 DIAGNOSIS — J9600 Acute respiratory failure, unspecified whether with hypoxia or hypercapnia: Secondary | ICD-10-CM

## 2012-10-20 LAB — BASIC METABOLIC PANEL
CO2: 24 mEq/L (ref 19–32)
Chloride: 109 mEq/L (ref 96–112)
Glucose, Bld: 127 mg/dL — ABNORMAL HIGH (ref 70–99)
Potassium: 4.4 mEq/L (ref 3.5–5.1)
Sodium: 144 mEq/L (ref 135–145)

## 2012-10-20 LAB — MAGNESIUM: Magnesium: 2.6 mg/dL — ABNORMAL HIGH (ref 1.5–2.5)

## 2012-10-21 DIAGNOSIS — Z930 Tracheostomy status: Secondary | ICD-10-CM

## 2012-10-21 LAB — BLOOD GAS, ARTERIAL
Bicarbonate: 27.5 mEq/L — ABNORMAL HIGH (ref 20.0–24.0)
O2 Content: 1 L/min
pCO2 arterial: 40.3 mmHg (ref 35.0–45.0)
pH, Arterial: 7.448 (ref 7.350–7.450)
pO2, Arterial: 76.1 mmHg — ABNORMAL LOW (ref 80.0–100.0)

## 2012-10-21 NOTE — Progress Notes (Signed)
PULMONARY  / CRITICAL CARE MEDICINE  Name: Corey [name] MRN: [id number] DOB: 1946/03/11    ADMISSION DATE:  10/02/2012  REFERRING MD :  Merton Border  CHIEF COMPLAINT:  Respiratory failure  BRIEF PATIENT DESCRIPTION:  67 yo male admitted to [facility] with TME and VDRF 2nd to seizure and cardiac arrest.  Transferred to [facility].  Had tracheostomy dislodged 6/08 and PCCM asked to assess respiratory status.  LINES / TUBES: 5/8 ETT >>5/19  5/19 tstomy #6 >> 6/08  ANTIBIOTICS: Meropenem  SUBJECTIVE:  Denies chest pain.  C/o cough, but no problems with expectoration.  Reports snoring with sleep disruption.  VITAL SIGNS: Reviewed in bedside chart  PHYSICAL EXAMINATION: General: No distress Neuro: Sleepy, wakes up easily, follows commands HEENT: tracheostomy stoma dressing clean Cardiovascular: regular Lungs: scattered rhonchi, no wheeze Abdomen: soft, non tender Musculoskeletal: no edema Skin: no rashes   Recent Labs Lab 10/17/12 1048 10/18/12 0555 10/20/12 1601  NA 145 145 144  K 4.6 4.3 4.4  CL 110 109 109  CO2 23 24 24   BUN 54* 56* 49*  CREATININE 3.67* 3.97* 2.70*  GLUCOSE 80 186* 127*    Recent Labs Lab 10/16/12 0600 10/18/12 0555  HGB 9.3* 9.0*  HCT 29.1* 28.5*  WBC 6.2 5.2  PLT 272 283   No results found.  ASSESSMENT / PLAN:  Acute on chronic respiratory failure 2nd to seizure, cardiac arrest, and aspiration pneumonia.  S/p tracheostomy >> dislodged 6/08.   ?OSA. P: - no need to re-insert tracheostomy - Abx per primary team - trial of auto CPAP qhs >> further assess for OSA as outpt - continue scheduled BD's  PCCM will sign off.  Please call if additional help needed.  [name], MD [facility] Pulmonary/Critical Care 10/21/2012, 11:16 AM Pager:  [phone] After 3pm call: [phone]

## 2012-10-23 LAB — BASIC METABOLIC PANEL
CO2: 25 mEq/L (ref 19–32)
Calcium: 10.3 mg/dL (ref 8.4–10.5)
GFR calc non Af Amer: 28 mL/min — ABNORMAL LOW (ref >90)
Glucose, Bld: 158 mg/dL — ABNORMAL HIGH (ref 70–99)
Potassium: 4.4 mEq/L (ref 3.5–5.1)
Sodium: 143 mEq/L (ref 135–145)

## 2012-10-23 LAB — CBC
Hemoglobin: 9.9 g/dL — ABNORMAL LOW (ref 13.0–17.0)
MCH: 25.5 pg — ABNORMAL LOW (ref 26.0–34.0)
Platelets: 277 10*3/uL (ref 150–400)
RBC: 3.88 MIL/uL — ABNORMAL LOW (ref 4.22–5.81)

## 2012-10-23 LAB — MAGNESIUM: Magnesium: 2.2 mg/dL (ref 1.5–2.5)

## 2012-10-23 LAB — PHOSPHORUS: Phosphorus: 3.4 mg/dL (ref 2.3–4.6)

## 2012-10-26 LAB — BLOOD GAS, ARTERIAL
FIO2: 0.21 %
O2 Saturation: 93.4 %
Patient temperature: 98.6
pH, Arterial: 7.495 — ABNORMAL HIGH (ref 7.350–7.450)

## 2012-10-26 LAB — URINALYSIS, ROUTINE W REFLEX MICROSCOPIC
Bilirubin Urine: NEGATIVE
Ketones, ur: NEGATIVE mg/dL
Nitrite: NEGATIVE
Specific Gravity, Urine: 1.012 (ref 1.005–1.030)
Urobilinogen, UA: 0.2 mg/dL (ref 0.0–1.0)
pH: 7.5 (ref 5.0–8.0)

## 2012-10-26 LAB — URINE MICROSCOPIC-ADD ON

## 2012-10-27 LAB — BASIC METABOLIC PANEL
CO2: 27 mEq/L (ref 19–32)
Calcium: 9.9 mg/dL (ref 8.4–10.5)
Creatinine, Ser: 2.28 mg/dL — ABNORMAL HIGH (ref 0.50–1.35)
GFR calc Af Amer: 33 mL/min — ABNORMAL LOW (ref >90)
GFR calc non Af Amer: 29 mL/min — ABNORMAL LOW (ref >90)
Sodium: 136 mEq/L (ref 135–145)

## 2012-10-27 LAB — URINE CULTURE

## 2012-10-28 LAB — CBC
MCV: 78.2 fL (ref 78.0–100.0)
Platelets: 304 10*3/uL (ref 150–400)
RDW: 13.6 % (ref 11.5–15.5)
WBC: 7 10*3/uL (ref 4.0–10.5)

## 2012-10-30 LAB — BASIC METABOLIC PANEL
Chloride: 102 mEq/L (ref 96–112)
GFR calc Af Amer: 31 mL/min — ABNORMAL LOW (ref >90)
GFR calc non Af Amer: 27 mL/min — ABNORMAL LOW (ref >90)
Glucose, Bld: 115 mg/dL — ABNORMAL HIGH (ref 70–99)
Potassium: 3.7 mEq/L (ref 3.5–5.1)
Sodium: 140 mEq/L (ref 135–145)

## 2012-10-30 LAB — CBC
Hemoglobin: 9.8 g/dL — ABNORMAL LOW (ref 13.0–17.0)
MCHC: 32.2 g/dL (ref 30.0–36.0)
WBC: 6.5 10*3/uL (ref 4.0–10.5)

## 2012-10-31 LAB — BASIC METABOLIC PANEL
BUN: 37 mg/dL — ABNORMAL HIGH (ref 6–23)
Chloride: 100 mEq/L (ref 96–112)
GFR calc Af Amer: 34 mL/min — ABNORMAL LOW (ref >90)
Potassium: 3.6 mEq/L (ref 3.5–5.1)

## 2012-11-01 LAB — CBC
MCV: 79.1 fL (ref 78.0–100.0)
Platelets: 323 10*3/uL (ref 150–400)
RBC: 4.22 MIL/uL (ref 4.22–5.81)
RDW: 14.2 % (ref 11.5–15.5)
WBC: 10 10*3/uL (ref 4.0–10.5)

## 2012-11-01 LAB — BASIC METABOLIC PANEL
CO2: 28 mEq/L (ref 19–32)
Calcium: 10.5 mg/dL (ref 8.4–10.5)
Creatinine, Ser: 2.42 mg/dL — ABNORMAL HIGH (ref 0.50–1.35)
GFR calc Af Amer: 31 mL/min — ABNORMAL LOW (ref >90)
GFR calc non Af Amer: 27 mL/min — ABNORMAL LOW (ref >90)
Sodium: 141 mEq/L (ref 135–145)

## 2012-11-02 ENCOUNTER — Other Ambulatory Visit (HOSPITAL_COMMUNITY): Payer: Self-pay

## 2012-11-02 LAB — BASIC METABOLIC PANEL
BUN: 49 mg/dL — ABNORMAL HIGH (ref 6–23)
CO2: 27 mEq/L (ref 19–32)
CO2: 25 mEq/L (ref 19–32)
Chloride: 98 mEq/L (ref 96–112)
Chloride: 100 mEq/L (ref 96–112)
Creatinine, Ser: 3.76 mg/dL — ABNORMAL HIGH (ref 0.50–1.35)
GFR calc Af Amer: 18 mL/min — ABNORMAL LOW (ref >90)
GFR calc Af Amer: 16 mL/min — ABNORMAL LOW (ref >90)
Potassium: 4.2 mEq/L (ref 3.5–5.1)
Sodium: 137 mEq/L (ref 135–145)

## 2012-11-02 LAB — CBC
HCT: 28.2 % — ABNORMAL LOW (ref 39.0–52.0)
Platelets: 243 10*3/uL (ref 150–400)
RBC: 3.62 MIL/uL — ABNORMAL LOW (ref 4.22–5.81)
RDW: 14.8 % (ref 11.5–15.5)
WBC: 15.1 10*3/uL — ABNORMAL HIGH (ref 4.0–10.5)

## 2012-11-02 LAB — URINALYSIS, ROUTINE W REFLEX MICROSCOPIC
Glucose, UA: NEGATIVE mg/dL
Nitrite: NEGATIVE
Specific Gravity, Urine: 1.019 (ref 1.005–1.030)
pH: 5.5 (ref 5.0–8.0)

## 2012-11-02 LAB — URINE MICROSCOPIC-ADD ON

## 2012-11-03 LAB — BASIC METABOLIC PANEL
BUN: 51 mg/dL — ABNORMAL HIGH (ref 6–23)
CO2: 24 mEq/L (ref 19–32)
Chloride: 100 mEq/L (ref 96–112)
Creatinine, Ser: 4.17 mg/dL — ABNORMAL HIGH (ref 0.50–1.35)
Glucose, Bld: 158 mg/dL — ABNORMAL HIGH (ref 70–99)

## 2012-11-04 ENCOUNTER — Other Ambulatory Visit (HOSPITAL_COMMUNITY): Payer: Self-pay

## 2012-11-04 LAB — BASIC METABOLIC PANEL
BUN: 61 mg/dL — ABNORMAL HIGH (ref 6–23)
CO2: 26 mEq/L (ref 19–32)
Chloride: 98 mEq/L (ref 96–112)
GFR calc Af Amer: 12 mL/min — ABNORMAL LOW (ref >90)
Glucose, Bld: 154 mg/dL — ABNORMAL HIGH (ref 70–99)
Potassium: 4.1 mEq/L (ref 3.5–5.1)

## 2012-11-04 LAB — CBC
HCT: 27.2 % — ABNORMAL LOW (ref 39.0–52.0)
Hemoglobin: 8.8 g/dL — ABNORMAL LOW (ref 13.0–17.0)
MCHC: 32.4 g/dL (ref 30.0–36.0)
MCV: 78.4 fL (ref 78.0–100.0)

## 2012-11-05 LAB — BASIC METABOLIC PANEL
BUN: 58 mg/dL — ABNORMAL HIGH (ref 6–23)
GFR calc non Af Amer: 14 mL/min — ABNORMAL LOW (ref >90)
Glucose, Bld: 101 mg/dL — ABNORMAL HIGH (ref 70–99)
Potassium: 4 mEq/L (ref 3.5–5.1)

## 2012-11-05 LAB — CBC
HCT: 28.1 % — ABNORMAL LOW (ref 39.0–52.0)
Hemoglobin: 9 g/dL — ABNORMAL LOW (ref 13.0–17.0)
MCH: 24.8 pg — ABNORMAL LOW (ref 26.0–34.0)
MCHC: 32 g/dL (ref 30.0–36.0)

## 2012-11-05 LAB — MAGNESIUM: Magnesium: 2.5 mg/dL (ref 1.5–2.5)

## 2012-11-05 LAB — URINE CULTURE

## 2012-11-06 LAB — CBC
HCT: 27.1 % — ABNORMAL LOW (ref 39.0–52.0)
MCH: 24.9 pg — ABNORMAL LOW (ref 26.0–34.0)
MCHC: 32.1 g/dL (ref 30.0–36.0)
MCV: 77.7 fL — ABNORMAL LOW (ref 78.0–100.0)
RDW: 14.4 % (ref 11.5–15.5)

## 2012-11-06 LAB — BASIC METABOLIC PANEL
BUN: 45 mg/dL — ABNORMAL HIGH (ref 6–23)
CO2: 25 mEq/L (ref 19–32)
Chloride: 105 mEq/L (ref 96–112)
Creatinine, Ser: 2.66 mg/dL — ABNORMAL HIGH (ref 0.50–1.35)

## 2012-11-07 LAB — CBC WITH DIFFERENTIAL/PLATELET
Basophils Absolute: 0 10*3/uL (ref 0.0–0.1)
Basophils Relative: 0 % (ref 0–1)
Eosinophils Absolute: 0.2 10*3/uL (ref 0.0–0.7)
MCHC: 32.1 g/dL (ref 30.0–36.0)
Monocytes Absolute: 0.6 10*3/uL (ref 0.1–1.0)
Neutro Abs: 4.5 10*3/uL (ref 1.7–7.7)
Neutrophils Relative %: 65 % (ref 43–77)
RDW: 14.9 % (ref 11.5–15.5)

## 2012-11-07 LAB — BASIC METABOLIC PANEL
BUN: 42 mg/dL — ABNORMAL HIGH (ref 6–23)
Creatinine, Ser: 2.27 mg/dL — ABNORMAL HIGH (ref 0.50–1.35)
GFR calc Af Amer: 33 mL/min — ABNORMAL LOW (ref >90)
GFR calc non Af Amer: 29 mL/min — ABNORMAL LOW (ref >90)

## 2012-11-07 LAB — PHOSPHORUS: Phosphorus: 3.1 mg/dL (ref 2.3–4.6)

## 2012-11-08 LAB — BASIC METABOLIC PANEL
CO2: 25 mEq/L (ref 19–32)
Chloride: 106 mEq/L (ref 96–112)
Creatinine, Ser: 1.87 mg/dL — ABNORMAL HIGH (ref 0.50–1.35)

## 2012-11-08 LAB — HEMOGLOBIN A1C: Hgb A1c MFr Bld: 7.4 % — ABNORMAL HIGH (ref <5.7)

## 2012-11-09 LAB — BASIC METABOLIC PANEL
BUN: 28 mg/dL — ABNORMAL HIGH (ref 6–23)
Chloride: 108 mEq/L (ref 96–112)
GFR calc Af Amer: 40 mL/min — ABNORMAL LOW (ref >90)
Potassium: 4.7 mEq/L (ref 3.5–5.1)

## 2012-11-10 LAB — BASIC METABOLIC PANEL
CO2: 22 mEq/L (ref 19–32)
Chloride: 107 mEq/L (ref 96–112)
Potassium: 4.5 mEq/L (ref 3.5–5.1)
Sodium: 139 mEq/L (ref 135–145)

## 2012-11-11 LAB — CBC
HCT: 28.4 % — ABNORMAL LOW (ref 39.0–52.0)
Hemoglobin: 9 g/dL — ABNORMAL LOW (ref 13.0–17.0)
MCV: 78.9 fL (ref 78.0–100.0)
RBC: 3.6 MIL/uL — ABNORMAL LOW (ref 4.22–5.81)
WBC: 6.8 10*3/uL (ref 4.0–10.5)

## 2012-11-11 LAB — COMPREHENSIVE METABOLIC PANEL
ALT: 31 U/L (ref 0–53)
AST: 33 U/L (ref 0–37)
BUN: 25 mg/dL — ABNORMAL HIGH (ref 6–23)
CO2: 23 mEq/L (ref 19–32)
Chloride: 104 mEq/L (ref 96–112)
Creatinine, Ser: 2.25 mg/dL — ABNORMAL HIGH (ref 0.50–1.35)
GFR calc Af Amer: 34 mL/min — ABNORMAL LOW (ref >90)

## 2012-11-12 LAB — URINALYSIS, ROUTINE W REFLEX MICROSCOPIC
Bilirubin Urine: NEGATIVE
Glucose, UA: NEGATIVE mg/dL
Nitrite: NEGATIVE
Specific Gravity, Urine: 1.007 (ref 1.005–1.030)
pH: 7 (ref 5.0–8.0)

## 2012-11-12 LAB — URINE MICROSCOPIC-ADD ON

## 2012-11-12 LAB — BASIC METABOLIC PANEL
BUN: 27 mg/dL — ABNORMAL HIGH (ref 6–23)
CO2: 23 mEq/L (ref 19–32)
Chloride: 106 mEq/L (ref 96–112)
GFR calc Af Amer: 34 mL/min — ABNORMAL LOW (ref >90)
Potassium: 4.6 mEq/L (ref 3.5–5.1)

## 2012-11-13 LAB — BASIC METABOLIC PANEL
CO2: 23 mEq/L (ref 19–32)
Calcium: 9.6 mg/dL (ref 8.4–10.5)
Creatinine, Ser: 2.11 mg/dL — ABNORMAL HIGH (ref 0.50–1.35)
Glucose, Bld: 125 mg/dL — ABNORMAL HIGH (ref 70–99)

## 2012-11-14 LAB — URINE CULTURE: Special Requests: NORMAL

## 2012-11-14 LAB — BASIC METABOLIC PANEL
CO2: 20 mEq/L (ref 19–32)
Chloride: 106 mEq/L (ref 96–112)
Sodium: 137 mEq/L (ref 135–145)

## 2012-12-03 ENCOUNTER — Ambulatory Visit (INDEPENDENT_AMBULATORY_CARE_PROVIDER_SITE_OTHER): Payer: No Typology Code available for payment source | Admitting: Internal Medicine

## 2012-12-03 ENCOUNTER — Encounter: Payer: Self-pay | Admitting: Internal Medicine

## 2012-12-03 ENCOUNTER — Telehealth: Payer: Self-pay | Admitting: Internal Medicine

## 2012-12-03 VITALS — BP 144/70 | HR 73 | Temp 99.0°F | Wt 186.0 lb

## 2012-12-03 DIAGNOSIS — N259 Disorder resulting from impaired renal tubular function, unspecified: Secondary | ICD-10-CM

## 2012-12-03 DIAGNOSIS — E1065 Type 1 diabetes mellitus with hyperglycemia: Secondary | ICD-10-CM

## 2012-12-03 DIAGNOSIS — I10 Essential (primary) hypertension: Secondary | ICD-10-CM

## 2012-12-03 DIAGNOSIS — D649 Anemia, unspecified: Secondary | ICD-10-CM

## 2012-12-03 DIAGNOSIS — I469 Cardiac arrest, cause unspecified: Secondary | ICD-10-CM

## 2012-12-03 DIAGNOSIS — E1029 Type 1 diabetes mellitus with other diabetic kidney complication: Secondary | ICD-10-CM

## 2012-12-03 DIAGNOSIS — Z431 Encounter for attention to gastrostomy: Secondary | ICD-10-CM

## 2012-12-03 DIAGNOSIS — M109 Gout, unspecified: Secondary | ICD-10-CM

## 2012-12-03 LAB — CBC WITH DIFFERENTIAL/PLATELET
Basophils Absolute: 0 10*3/uL (ref 0.0–0.1)
HCT: 29.1 % — ABNORMAL LOW (ref 39.0–52.0)
Hemoglobin: 9.6 g/dL — ABNORMAL LOW (ref 13.0–17.0)
Lymphs Abs: 1.2 10*3/uL (ref 0.7–4.0)
MCHC: 33.1 g/dL (ref 30.0–36.0)
Monocytes Relative: 7.5 % (ref 3.0–12.0)
Neutro Abs: 10.4 10*3/uL — ABNORMAL HIGH (ref 1.4–7.7)
RDW: 17 % — ABNORMAL HIGH (ref 11.5–14.6)

## 2012-12-03 LAB — URIC ACID: Uric Acid, Serum: 8.3 mg/dL — ABNORMAL HIGH (ref 4.0–7.8)

## 2012-12-03 LAB — BASIC METABOLIC PANEL
CO2: 19 mEq/L (ref 19–32)
GFR: 36.74 mL/min — ABNORMAL LOW (ref >60.00)
Glucose, Bld: 70 mg/dL (ref 70–99)
Potassium: 3.9 mEq/L (ref 3.5–5.1)
Sodium: 137 mEq/L (ref 135–145)

## 2012-12-03 LAB — TSH: TSH: 1.06 u[IU]/mL (ref 0.35–5.50)

## 2012-12-03 LAB — LIPID PANEL: HDL: 36.3 mg/dL — ABNORMAL LOW (ref >39.00)

## 2012-12-03 MED ORDER — METOPROLOL SUCCINATE ER 100 MG PO TB24: 100.0000 mg | ORAL_TABLET | Freq: Two times a day (BID) | ORAL | Status: DC

## 2012-12-03 MED ORDER — AMLODIPINE BESYLATE 10 MG PO TABS: 10.0000 mg | ORAL_TABLET | Freq: Every day | ORAL | Status: DC

## 2012-12-03 MED ORDER — ISOSORBIDE MONONITRATE ER 60 MG PO TB24: 60.0000 mg | ORAL_TABLET | Freq: Two times a day (BID) | ORAL | Status: DC

## 2012-12-03 MED ORDER — CLONIDINE HCL 0.1 MG PO TABS: 0.1000 mg | ORAL_TABLET | Freq: Every day | ORAL | Status: DC

## 2012-12-03 MED ORDER — TAMSULOSIN HCL 0.4 MG PO CAPS: 0.4000 mg | ORAL_CAPSULE | Freq: Every day | ORAL | Status: DC

## 2012-12-03 NOTE — Telephone Encounter (Signed)
Hopp please advise for these are new medications, patient was seen today

## 2012-12-03 NOTE — Progress Notes (Signed)
  Subjective:    Patient ID: Corey [name], male    DOB: December 29, 1945, 67 y.o.   MRN: [id number]  HPI  His prolonged and complicated hospital course was reviewed in the problem list updated to reflect clinical summary.  His wife states he is eating very well and she is not using the feeding tube are regular basis. It's her understanding that was placed for renal protection. He is on Megace.  His most recent labs revealed a creatinine of 1.92; BUN 26; and GFR of 41.       Review of Systems HYPERTENSION: Disease Monitoring: Blood pressure range- no data Chest pain, palpitations- no       Dyspnea- no Medications: Compliance- yes  Lightheadedness,Syncope- occasional positional lightheadedness   Edema- occasionally  DM:  last A1c was 7.4% June/27/14. FBS : 83- 127; no 2 hr pc glucose checks Polyuria/phagia/dipsia- excess thirst & urination      Visual problems- blurred vision  HYPERLIPIDEMIA: Disease Monitoring: last LDL 72 in 8/12 See symptoms for Hypertension Medications: Compliance- not on statin  Abd pain, bowel changes- no   Muscle aches- no     Objective:   Physical Exam  Gen.:  Adequately nourished in appearance. Alert, appropriate and cooperative throughout exam. Eyes: No corneal or conjunctival inflammation noted. Proptosis  Nose: External nasal exam reveals no deformity or inflammation. Nasal mucosa are pink and moist. No lesions or exudates noted.   Mouth: Oral mucosa and oropharynx reveal no lesions or exudates. Teeth in poor repair. Neck: No deformities, masses, or tenderness noted.  Thyroid normal. Lungs: Normal respiratory effort; chest expands symmetrically. Lungs are clear to auscultation without rales, wheezes, or increased work of breathing. Heart: Normal rate and rhythm. Normal S1 and S2. No gallop, click, or rub. S4 w/o murmur. Abdomen: Bowel sounds normal; abdomen soft and nontender. No masses, organomegaly or hernias noted. PEG present                 Musculoskeletal/extremities:  No clubbing, cyanosis, edema, or significant extremity  deformity noted.Tone & strength  Normal. Joints  reveal minor  DJD DIP flexion  changes. Nail health good. Able to lie down & sit up w/o help.  Vascular: Carotid, radial artery, dorsalis pedis and  posterior tibial pulses are full and equal. No bruits present. Neurologic: Alert and oriented x3. Deep tendon reflexes symmetrical and normal.         Skin: Intact without suspicious lesions or rashes. Lymph: No cervical, axillary lymphadenopathy present. Psych: Mood and affect are normal. Normally interactive                                                                                        Assessment & Plan:  See Current Assessment & Plan in Problem List under specific Diagnosis

## 2012-12-03 NOTE — Patient Instructions (Addendum)
If you activate the  My Chart system; lab & Xray results will be released directly  to you as soon as I review & address these through the computer. If you choose not to sign up for My Chart within 36 hours of labs being drawn; results will be reviewed & interpretation added before being copied & mailed, causing a delay in getting the results to you.If you do not receive that report within 7-10 days ,please call. Additionally you can use this system to gain direct  access to your records  if  out of town or @ an office of a  physician who is not in  the My Chart network.  This improves continuity of care & places you in control of your medical record.    [id number] Jenera  For 9/1/-03/13/13 reviewed & completed. Discrepancies noted between EMR Med List & meds listed on form . Handwritten notes made on form  & request made for clarification as follows: Please document the name of the prescribing caregiver beside each medication listed on the patient's Plan of Care medication list.  This is to enhance continuity of care and prevent potential  adverse drug:drug interactions due to duplication of medications as branded and generic forms; repeated medication changes with hospitalizations and/or  at outpatient subspecialty visits; and lack of the EMR in some medical practices. Such risk is greatest with polypharmacy especially among  geriatric patients as has been documented repeatedly by Dr. Elliot Cousin and his associates in multiple long-term studies.

## 2012-12-03 NOTE — Assessment & Plan Note (Signed)
Abnormal labs will be rechecked D/C Megace & D/C PEG based on improved appetite

## 2012-12-03 NOTE — Addendum Note (Signed)
Addended byHendricks Limes on: 12/03/2012 04:57 PM   Modules accepted: Orders

## 2012-12-03 NOTE — Telephone Encounter (Signed)
Patient need refills on amlodipine 10 mg, isosorb mono er 60 mg, metoprolol tart 100mg , tamsulosin 0.4mg , and clonidine 0.1mg  sent to [facility] on [address].

## 2012-12-03 NOTE — Telephone Encounter (Signed)
Please renew X 90 days

## 2012-12-03 NOTE — Telephone Encounter (Signed)
RXs sent.

## 2012-12-05 ENCOUNTER — Telehealth: Payer: Self-pay | Admitting: Gastroenterology

## 2012-12-06 NOTE — Telephone Encounter (Signed)
Pt scheduled to see Dr. Deatra Ina 12/10/12@1 :30pm. Pt aware of appt date and time.

## 2012-12-09 ENCOUNTER — Telehealth: Payer: Self-pay

## 2012-12-09 NOTE — Telephone Encounter (Signed)
Nira Conn, RN from [place] [facility] was calling to inform PCP that a visit was missed on Friday to see the patient. Anytime a visit is missed they are required to inform PCP

## 2012-12-10 ENCOUNTER — Encounter: Payer: Self-pay | Admitting: Gastroenterology

## 2012-12-10 ENCOUNTER — Ambulatory Visit (INDEPENDENT_AMBULATORY_CARE_PROVIDER_SITE_OTHER): Payer: No Typology Code available for payment source | Admitting: Gastroenterology

## 2012-12-10 VITALS — BP 140/70 | HR 60 | Ht 65.5 in | Wt 189.1 lb

## 2012-12-10 DIAGNOSIS — R633 Feeding difficulties, unspecified: Secondary | ICD-10-CM | POA: Insufficient documentation

## 2012-12-10 NOTE — Progress Notes (Signed)
History of Present Illness:  67 year old Afro-American male referred for removal of gastrostomy tube. This was placed in may, 2014 during hospitalization for a CVA. He has not been using the tube for feedings for several weeks. Weight has increased.    Past Medical History  Diagnosis Date  . Renal insufficiency   . Diabetes mellitus   . Hypertension   . Gout   . Colon polyp 2003    Dr [name]; F/U was to be 2008( not completed)  . Diverticulosis 2003  . Pneumonia 09/29/11    Avelox X 10 days as OP   Past Surgical History  Procedure Laterality Date  . Refractive surgery  2005  . Colonoscopy w/ polypectomy  2003    no F/U ([place] discussed 12/03/12)  . Wrist aspiration  02/16/12     monosodium urate crystals; Dr Caralyn Guile   family history includes Diabetes in his father and mother; Hypertension in his father and mother; and Prostate cancer in his father.  There is no history of Stroke and Heart disease. Current Outpatient Prescriptions  Medication Sig Dispense Refill  . amLODipine (NORVASC) 10 MG tablet Take 1 tablet (10 mg total) by mouth daily.  90 tablet  0  . cloNIDine (CATAPRES) 0.1 MG tablet Take 1 tablet (0.1 mg total) by mouth daily.  90 tablet  0  . feeding supplement (PRO-STAT SUGAR FREE 64) LIQD Place 60 mLs into feeding tube 3 (three) times daily.  900 mL  0  . hydrALAZINE (APRESOLINE) 25 MG tablet Take 25 mg by mouth. 3 BY MOUTH THREE TIMES DAILY      . insulin glargine (LANTUS) 100 UNIT/ML injection Inject 18 Units into the skin daily.      Marland Kitchen ipratropium-albuterol (DUONEB) 0.5-2.5 (3) MG/3ML SOLN Take 3 mLs by nebulization 3 (three) times daily as needed.      . isosorbide mononitrate (IMDUR) 60 MG 24 hr tablet Take 1 tablet (60 mg total) by mouth 2 (two) times daily.  180 tablet  0  . metoprolol succinate (TOPROL-XL) 100 MG 24 hr tablet Take 1 tablet (100 mg total) by mouth 2 (two) times daily. Take with or immediately following a meal.  180 tablet  0  . tamsulosin  (FLOMAX) 0.4 MG CAPS Take 1 capsule (0.4 mg total) by mouth daily.  90 capsule  0   No current facility-administered medications for this visit.   Allergies as of 12/10/2012 - Review Complete 12/10/2012  Allergen Reaction Noted  . Hydrochlorothiazide  10/05/2011  . Allopurinol  10/05/2011    reports that he quit smoking about 44 years ago. He has never used smokeless tobacco. He reports that he does not drink alcohol or use illicit drugs.     Review of Systems: Pertinent positive and negative review of systems were noted in the above HPI section. All other review of systems were otherwise negative.  Vital signs were reviewed in today's medical record Physical Exam: General: Well developed , well nourished, no acute distress Skin: anicteric Head: Normocephalic and atraumatic Eyes:  sclerae anicteric, EOMI Ears: Normal auditory acuity Mouth: No deformity or lesions Neck: Supple, no masses or thyromegaly Lungs: Clear throughout to auscultation Heart: Regular rate and rhythm; no murmurs, rubs or bruits Abdomen: Soft, non tender and non distended. No masses, hepatosplenomegaly or hernias noted. Normal Bowel sounds. Gastrostomy tube is in place. Rectal:deferred Musculoskeletal: Symmetrical with no gross deformities  Skin: No lesions on visible extremities Pulses:  Normal pulses noted Extremities: No clubbing, cyanosis, edema or  deformities noted Neurological: Alert oriented x 4, grossly nonfocal Cervical Nodes:  No significant cervical adenopathy Inguinal Nodes: No significant inguinal adenopathy Psychological:  Alert and cooperative. Normal mood and affect  The gastrostomy tube was pulled using traction

## 2012-12-10 NOTE — Assessment & Plan Note (Signed)
Gastrostomy tube was pulled without incident. Patient will continue normal by mouth intake.

## 2012-12-11 NOTE — Telephone Encounter (Signed)
Hopp please confirm that you have seen this message and ok to close encounter unless f/u recommended

## 2012-12-11 NOTE — Telephone Encounter (Signed)
Patient was seen yesterday.

## 2012-12-11 NOTE — Telephone Encounter (Signed)
   Please resume home monitor schedule; a major issue is his noncompliance with his medication and diet.

## 2012-12-26 ENCOUNTER — Encounter: Payer: Self-pay | Admitting: Endocrinology

## 2012-12-26 ENCOUNTER — Ambulatory Visit (INDEPENDENT_AMBULATORY_CARE_PROVIDER_SITE_OTHER): Payer: No Typology Code available for payment source | Admitting: Endocrinology

## 2012-12-26 VITALS — BP 112/60 | HR 60 | Temp 98.8°F | Resp 10 | Ht 66.0 in | Wt 187.0 lb

## 2012-12-26 DIAGNOSIS — I10 Essential (primary) hypertension: Secondary | ICD-10-CM

## 2012-12-26 DIAGNOSIS — L97921 Non-pressure chronic ulcer of unspecified part of left lower leg limited to breakdown of skin: Secondary | ICD-10-CM

## 2012-12-26 DIAGNOSIS — L97909 Non-pressure chronic ulcer of unspecified part of unspecified lower leg with unspecified severity: Secondary | ICD-10-CM

## 2012-12-26 NOTE — Progress Notes (Signed)
Patient ID: Corey [name], male   DOB: 05/02/1946, 67 y.o.   MRN: [id number]    Reason for Appointment : Consultation for Type 2 Diabetes  History of Present Illness          Diagnosis: Type 2 diabetes mellitus, date of diagnosis: 1997      Past history: The patient was initially treated with various oral hypoglycemic drugs and details are not available. He has generally been followed by his primary care physician About 5 years ago he was started on insulin and most recently had been taking premixed insulin in the morning once a day   Recent history:  since his hospitalization in 5/14 he has been taking only Lantus insulin, 20 units at night Does not think he was given any mealtime coverage in the past Did not bring any blood sugar records for review and does not know what monitoring he is using He feels that his blood sugars are fairly good in the morning but does not check any readings nonfasting    Glucose monitoring:  done one time a day         Glucometer: unknown      Blood Glucose readings from meter download: readings before breakfast:  125-130, lowest 80-90        Hypoglycemia frequency: none recently.           Self-care: The diet that the patient has been following [id number] to limit carbohydrates.      Meals: 3 meals per day. Cereal in am, sometimes sandwich at lunch          Physical activity: exercise: Some walking 2-3 times a week.          Dietician visit: Most recent:never                Oral hypoglycemic drugs the patient is taking are:  none        INSULIN regimen is described as: 18 Lantus at bedtime Compliance with the medical regimen:  good  Retinal exam: Most recent: about a year ago .    Lab Results  Component Value Date   HGBA1C 7.4* 11/08/2012    Filed Weights   12/26/12 1316  Weight: 187 lb (84.823 kg)      Medication List       This list is accurate as of: 12/26/12  1:30 PM.  Always use your most recent med list.               amLODipine 10  MG tablet  Commonly known as:  NORVASC  Take 1 tablet (10 mg total) by mouth daily.     cloNIDine 0.1 MG tablet  Commonly known as:  CATAPRES  Take 1 tablet (0.1 mg total) by mouth daily.     feeding supplement Liqd  Place 60 mLs into feeding tube 3 (three) times daily.     hydrALAZINE 25 MG tablet  Commonly known as:  APRESOLINE  Take 25 mg by mouth. 3 BY MOUTH THREE TIMES DAILY     insulin glargine 100 UNIT/ML injection  Commonly known as:  LANTUS  Inject 18 Units into the skin daily.     ipratropium-albuterol 0.5-2.5 (3) MG/3ML Soln  Commonly known as:  DUONEB  Take 3 mLs by nebulization 3 (three) times daily as needed.     isosorbide mononitrate 60 MG 24 hr tablet  Commonly known as:  IMDUR  Take 1 tablet (60 mg total) by mouth 2 (two) times daily.  metoprolol succinate 100 MG 24 hr tablet  Commonly known as:  TOPROL-XL  Take 1 tablet (100 mg total) by mouth 2 (two) times daily. Take with or immediately following a meal.     tamsulosin 0.4 MG Caps capsule  Commonly known as:  FLOMAX  Take 1 capsule (0.4 mg total) by mouth daily.        Allergies:  Allergies  Allergen Reactions  . Hydrochlorothiazide     Gout , uncontrolled diabetes and renal insufficiency  . Allopurinol     Renal insufficiency    Past Medical History  Diagnosis Date  . Renal insufficiency   . Diabetes mellitus   . Hypertension   . Gout   . Colon polyp 2003    Dr [name]; F/U was to be 2008( not completed)  . Diverticulosis 2003  . Pneumonia 09/29/11    Avelox X 10 days as OP    Past Surgical History  Procedure Laterality Date  . Refractive surgery  2005  . Colonoscopy w/ polypectomy  2003    no F/U ([place] discussed 12/03/12)  . Wrist aspiration  02/16/12     monosodium urate crystals; Dr Caralyn Guile    Family History  Problem Relation Age of Onset  . Hypertension Father   . Prostate cancer Father   . Diabetes Father   . Hypertension Mother   . Diabetes Mother   . Stroke  Neg Hx   . Heart disease Neg Hx     Social History:  reports that he quit smoking about 44 years ago. He has never used smokeless tobacco. He reports that he does not drink alcohol or use illicit drugs.    Review of Systems       Lipids: Only abnormality is low HDL, currently not on a statin drug  Eye exam 1-2 years, ? Retinopathy      No unusual headaches.                  Skin: No rash or infections     Thyroid:  No  unusual fatigue.     The blood pressure has been controlled with regimen of amlodipine, hydralazine metoprolol and clonidine     No swelling of feet.     No shortness of breath on exertion.     Bowel habits:  No recent problems       Has history of BPH      No joint  pains.      No  depression          No history of Numbness, tingling or burning in  feet       He developed an open area on his left lower leg after a blunt injury and this still has not healed after a few weeks     He appears to have anemia possibly from his renal insufficiency    He has renal insufficiency of unclear etiology, no recent microalbumin available   No visits with results within 1 Week(s) from this visit. Latest known visit with results is:  Office Visit on 12/03/2012  Component Date Value Range Status  . Sodium 12/03/2012 137  135 - 145 mEq/L Final  . Potassium 12/03/2012 3.9  3.5 - 5.1 mEq/L Final  . Chloride 12/03/2012 105  96 - 112 mEq/L Final  . CO2 12/03/2012 19  19 - 32 mEq/L Final  . Glucose, Bld 12/03/2012 70  70 - 99 mg/dL Final  . BUN 12/03/2012 33* 6 - 23  mg/dL Final  . Creatinine, Ser 12/03/2012 2.3* 0.4 - 1.5 mg/dL Final  . Calcium 12/03/2012 10.6* 8.4 - 10.5 mg/dL Final  . GFR 12/03/2012 36.74* >60.00 mL/min Final  . WBC 12/03/2012 12.6* 4.5 - 10.5 K/uL Final  . RBC 12/03/2012 3.67* 4.22 - 5.81 Mil/uL Final  . Hemoglobin 12/03/2012 9.6* 13.0 - 17.0 g/dL Final  . HCT 12/03/2012 29.1* 39.0 - 52.0 % Final  . MCV 12/03/2012 79.4  78.0 - 100.0 fl Final  .  MCHC 12/03/2012 33.1  30.0 - 36.0 g/dL Final  . RDW 12/03/2012 17.0* 11.5 - 14.6 % Final  . Platelets 12/03/2012 216.0  150.0 - 400.0 K/uL Final  . Neutrophils Relative % 12/03/2012 82.5* 43.0 - 77.0 % Final  . Lymphocytes Relative 12/03/2012 9.3* 12.0 - 46.0 % Final  . Monocytes Relative 12/03/2012 7.5  3.0 - 12.0 % Final  . Eosinophils Relative 12/03/2012 0.5  0.0 - 5.0 % Final  . Basophils Relative 12/03/2012 0.2  0.0 - 3.0 % Final  . Neutro Abs 12/03/2012 10.4* 1.4 - 7.7 K/uL Final  . Lymphs Abs 12/03/2012 1.2  0.7 - 4.0 K/uL Final  . Monocytes Absolute 12/03/2012 0.9  0.1 - 1.0 K/uL Final  . Eosinophils Absolute 12/03/2012 0.1  0.0 - 0.7 K/uL Final  . Basophils Absolute 12/03/2012 0.0  0.0 - 0.1 K/uL Final  . Cholesterol 12/03/2012 118  0 - 200 mg/dL Final   ATP III Classification       Desirable:  < 200 mg/dL               Borderline High:  200 - 239 mg/dL          High:  > = 240 mg/dL  . Triglycerides 12/03/2012 98.0  0.0 - 149.0 mg/dL Final   Normal:  <150 mg/dLBorderline High:  150 - 199 mg/dL  . HDL 12/03/2012 36.30* >39.00 mg/dL Final  . VLDL 12/03/2012 19.6  0.0 - 40.0 mg/dL Final  . LDL Cholesterol 12/03/2012 62  0 - 99 mg/dL Final  . Total CHOL/HDL Ratio 12/03/2012 3   Final                  Men          Women1/2 Average Risk     3.4          3.3Average Risk          5.0          4.42X Average Risk          9.6          7.13X Average Risk          15.0          11.0                      . TSH 12/03/2012 1.06  0.35 - 5.50 uIU/mL Final  . Uric Acid, Serum 12/03/2012 8.3* 4.0 - 7.8 mg/dL Final    Physical Examination:  BP 112/60  Pulse 60  Temp(Src) 98.8 F (37.1 C) (Oral)  Resp 10  Ht 5\' 6"  (1.676 m)  Wt 187 lb (84.823 kg)  BMI 30.2 kg/m2  SpO2 96%  GENERAL:         Patient appears to have mild generalized obesity.   HEENT:         Eye exam shows normal external appearance. Fundus exam shows no retinopathy. Oral exam shows normal mucosa .  NECK:  General:   Neck exam shows no lymphadenopathy. Carotids are normal to palpation and no bruit heard. Thyroid is not enlarged and no nodules felt.   LUNGS:         Chest is symmetrical. Lungs are clear to auscultation.Marland Kitchen   HEART:         Heart sounds:  S1 and S2 are normal. No murmurs or clicks heard., no S3 or S4.   ABDOMEN:         General:  There is no distention present. Liver and spleen are not palpable. No other mass or tenderness present.  EXTREMITIES:     There is no edema. His left lower leg has a linear shallow ulceration on the tibia with slightly yellowish base and no surrounding erythema or tenderness  NEUROLOGICAL:        Vibration sense is minimally reduced in toes. Ankle jerks are normal bilaterally.         Diabetic foot exam:            Inspection  Normal                    Monofilament  Normal  MUSCULOSKELETAL:       There is no enlargement or deformity of the joints. Spine is normal to inspection.Marland Kitchen   PEDAL pulses: Normal SKIN:       No rash. Skin on feet is dry, mild onychomycosis present       ASSESSMENT/PLAN:  Diabetes type 2, uncontrolled - 250.02    He has had long-standing diabetes and is on low dose of basal only insulin with fair control Currently only on small amount of basal insulin without any mealtime coverage Unable to use most oral hypoglycemic drugs because of renal insufficiency Most recent A1c is over 7% Unclear whether he has high readings after meals as he is not monitoring at that time Also not clear how accurate his meter is and will check on the brand he is using  Although his glucose fasting appears to be fairly good he likely will need a mealtime insulin Currently is complaining about the cost of Lantus insulin Since he already has a supply of Humalog 75/25 at home will start this again with 10 units twice a day which will provide coverage of his meals also His largest meal is at suppertime His diet is reasonably good although not getting any protein at  breakfast He will need to know diabetes education and meal planning advice which he has not had before Also discussed balanced meals, avoiding regular soft drinks and regular exercise He was also given a glucose diary and discussed timing and targets of blood sugars  Complications:  none apparent, results of eye exam not available  Lab Results  Component Value Date   MICROALBUR 3.88* 03/29/2012    Left leg nonhealing ulcer: He will be referred to the wound care center, meanwhile continue topical antibiotics and dressing  RENAL insufficiency: Not clear if he is seeing a nephrologist, plan deferred to PCP. He has secondary issues including anemia and hyperuricemia  Corey [name] 12/26/2012, 1:30 PM

## 2012-12-26 NOTE — Patient Instructions (Addendum)
Please check blood sugars at least half the time about 2 hours after any meal and as directed on waking up. Please bring blood sugar monitor to each visit  Humalog Mix insulin TEN  Units, 10 min before breakfast and supper   Add protein in am: egg, cheese, lean meat   Avoid reg. sodas

## 2013-01-07 ENCOUNTER — Ambulatory Visit: Payer: No Typology Code available for payment source | Admitting: Endocrinology

## 2013-01-08 ENCOUNTER — Encounter (HOSPITAL_COMMUNITY): Payer: Self-pay | Admitting: *Deleted

## 2013-01-08 ENCOUNTER — Inpatient Hospital Stay (HOSPITAL_COMMUNITY)
Admission: EM | Admit: 2013-01-08 | Discharge: 2013-01-12 | DRG: 603 | Disposition: A | Discharge status: Home Health | Payer: No Typology Code available for payment source | Attending: Internal Medicine | Admitting: Internal Medicine

## 2013-01-08 ENCOUNTER — Emergency Department (HOSPITAL_COMMUNITY): Payer: No Typology Code available for payment source

## 2013-01-08 DIAGNOSIS — E1165 Type 2 diabetes mellitus with hyperglycemia: Secondary | ICD-10-CM | POA: Diagnosis present

## 2013-01-08 DIAGNOSIS — D631 Anemia in chronic kidney disease: Secondary | ICD-10-CM | POA: Diagnosis present

## 2013-01-08 DIAGNOSIS — B879 Myiasis, unspecified: Secondary | ICD-10-CM | POA: Diagnosis present

## 2013-01-08 DIAGNOSIS — E119 Type 2 diabetes mellitus without complications: Secondary | ICD-10-CM | POA: Diagnosis present

## 2013-01-08 DIAGNOSIS — E1142 Type 2 diabetes mellitus with diabetic polyneuropathy: Secondary | ICD-10-CM | POA: Diagnosis present

## 2013-01-08 DIAGNOSIS — R633 Feeding difficulties: Secondary | ICD-10-CM

## 2013-01-08 DIAGNOSIS — N058 Unspecified nephritic syndrome with other morphologic changes: Secondary | ICD-10-CM | POA: Diagnosis present

## 2013-01-08 DIAGNOSIS — A419 Sepsis, unspecified organism: Secondary | ICD-10-CM

## 2013-01-08 DIAGNOSIS — L02419 Cutaneous abscess of limb, unspecified: Principal | ICD-10-CM | POA: Diagnosis present

## 2013-01-08 DIAGNOSIS — S81801A Unspecified open wound, right lower leg, initial encounter: Secondary | ICD-10-CM

## 2013-01-08 DIAGNOSIS — I10 Essential (primary) hypertension: Secondary | ICD-10-CM | POA: Diagnosis present

## 2013-01-08 DIAGNOSIS — I129 Hypertensive chronic kidney disease with stage 1 through stage 4 chronic kidney disease, or unspecified chronic kidney disease: Secondary | ICD-10-CM | POA: Diagnosis present

## 2013-01-08 DIAGNOSIS — E108 Type 1 diabetes mellitus with unspecified complications: Secondary | ICD-10-CM | POA: Diagnosis present

## 2013-01-08 DIAGNOSIS — I469 Cardiac arrest, cause unspecified: Secondary | ICD-10-CM

## 2013-01-08 DIAGNOSIS — S81802A Unspecified open wound, left lower leg, initial encounter: Secondary | ICD-10-CM

## 2013-01-08 DIAGNOSIS — R9431 Abnormal electrocardiogram [ECG] [EKG]: Secondary | ICD-10-CM

## 2013-01-08 DIAGNOSIS — Z87891 Personal history of nicotine dependence: Secondary | ICD-10-CM

## 2013-01-08 DIAGNOSIS — N259 Disorder resulting from impaired renal tubular function, unspecified: Secondary | ICD-10-CM

## 2013-01-08 DIAGNOSIS — Z79899 Other long term (current) drug therapy: Secondary | ICD-10-CM

## 2013-01-08 DIAGNOSIS — L0291 Cutaneous abscess, unspecified: Secondary | ICD-10-CM

## 2013-01-08 DIAGNOSIS — E1149 Type 2 diabetes mellitus with other diabetic neurological complication: Secondary | ICD-10-CM | POA: Diagnosis present

## 2013-01-08 DIAGNOSIS — Z794 Long term (current) use of insulin: Secondary | ICD-10-CM

## 2013-01-08 DIAGNOSIS — N189 Chronic kidney disease, unspecified: Secondary | ICD-10-CM | POA: Diagnosis present

## 2013-01-08 DIAGNOSIS — Z8674 Personal history of sudden cardiac arrest: Secondary | ICD-10-CM

## 2013-01-08 DIAGNOSIS — M109 Gout, unspecified: Secondary | ICD-10-CM

## 2013-01-08 DIAGNOSIS — D649 Anemia, unspecified: Secondary | ICD-10-CM

## 2013-01-08 DIAGNOSIS — L0390 Cellulitis, unspecified: Secondary | ICD-10-CM | POA: Diagnosis present

## 2013-01-08 DIAGNOSIS — E1129 Type 2 diabetes mellitus with other diabetic kidney complication: Secondary | ICD-10-CM

## 2013-01-08 DIAGNOSIS — S81009A Unspecified open wound, unspecified knee, initial encounter: Secondary | ICD-10-CM

## 2013-01-08 LAB — GLUCOSE, CAPILLARY: Glucose-Capillary: 114 mg/dL — ABNORMAL HIGH (ref 70–99)

## 2013-01-08 LAB — CBC WITH DIFFERENTIAL/PLATELET
Basophils Relative: 0 % (ref 0–1)
HCT: 21 % — ABNORMAL LOW (ref 39.0–52.0)
Hemoglobin: 6.7 g/dL — CL (ref 13.0–17.0)
Lymphocytes Relative: 19 % (ref 12–46)
MCHC: 31.9 g/dL (ref 30.0–36.0)
Monocytes Absolute: 0.8 10*3/uL (ref 0.1–1.0)
Monocytes Relative: 9 % (ref 3–12)
Neutro Abs: 6.3 10*3/uL (ref 1.7–7.7)

## 2013-01-08 LAB — BASIC METABOLIC PANEL
BUN: 21 mg/dL (ref 6–23)
Chloride: 103 mEq/L (ref 96–112)
GFR calc Af Amer: 47 mL/min — ABNORMAL LOW (ref >90)
Potassium: 4.2 mEq/L (ref 3.5–5.1)

## 2013-01-08 LAB — CBC
Platelets: 299 10*3/uL (ref 150–400)
RBC: 2.76 MIL/uL — ABNORMAL LOW (ref 4.22–5.81)
RDW: 15.7 % — ABNORMAL HIGH (ref 11.5–15.5)
WBC: 8.1 10*3/uL (ref 4.0–10.5)

## 2013-01-08 LAB — OCCULT BLOOD, POC DEVICE: Fecal Occult Bld: NEGATIVE

## 2013-01-08 MED ORDER — ACETAMINOPHEN 650 MG RE SUPP: 650.0000 mg | Freq: Four times a day (QID) | RECTAL | Status: DC | PRN

## 2013-01-08 MED ORDER — ACETAMINOPHEN 500 MG PO TABS
1000.0000 mg | ORAL_TABLET | Freq: Once | ORAL | Status: AC
  Administered 2013-01-08: 1000 mg via ORAL
  Filled 2013-01-08: qty 2

## 2013-01-08 MED ORDER — ALBUTEROL SULFATE (5 MG/ML) 0.5% IN NEBU: 2.5000 mg | INHALATION_SOLUTION | Freq: Three times a day (TID) | RESPIRATORY_TRACT | Status: DC | PRN

## 2013-01-08 MED ORDER — ENOXAPARIN SODIUM 40 MG/0.4ML ~~LOC~~ SOLN
40.0000 mg | SUBCUTANEOUS | Status: DC
  Administered 2013-01-09 – 2013-01-12 (×4): 40 mg via SUBCUTANEOUS
  Filled 2013-01-08 (×4): qty 0.4

## 2013-01-08 MED ORDER — PIPERACILLIN-TAZOBACTAM 3.375 G IVPB
3.3750 g | Freq: Three times a day (TID) | INTRAVENOUS | Status: DC
  Administered 2013-01-09 – 2013-01-12 (×10): 3.375 g via INTRAVENOUS
  Filled 2013-01-08 (×12): qty 50

## 2013-01-08 MED ORDER — PIPERACILLIN-TAZOBACTAM 3.375 G IVPB 30 MIN
3.3750 g | Freq: Once | INTRAVENOUS | Status: AC
  Administered 2013-01-08: 3.375 g via INTRAVENOUS
  Filled 2013-01-08: qty 50

## 2013-01-08 MED ORDER — SODIUM CHLORIDE 0.9 % IJ SOLN: 3.0000 mL | Freq: Two times a day (BID) | INTRAMUSCULAR | Status: DC

## 2013-01-08 MED ORDER — CLONIDINE HCL 0.1 MG PO TABS
0.1000 mg | ORAL_TABLET | Freq: Every day | ORAL | Status: DC
  Administered 2013-01-09 – 2013-01-11 (×3): 0.1 mg via ORAL
  Filled 2013-01-08 (×3): qty 1

## 2013-01-08 MED ORDER — IPRATROPIUM-ALBUTEROL 0.5-2.5 (3) MG/3ML IN SOLN: 3.0000 mL | Freq: Three times a day (TID) | RESPIRATORY_TRACT | Status: DC | PRN

## 2013-01-08 MED ORDER — ONDANSETRON HCL 4 MG PO TABS: 4.0000 mg | ORAL_TABLET | Freq: Four times a day (QID) | ORAL | Status: DC | PRN

## 2013-01-08 MED ORDER — TAMSULOSIN HCL 0.4 MG PO CAPS
0.4000 mg | ORAL_CAPSULE | Freq: Every day | ORAL | Status: DC
  Administered 2013-01-09 – 2013-01-12 (×4): 0.4 mg via ORAL
  Filled 2013-01-08 (×4): qty 1

## 2013-01-08 MED ORDER — INSULIN GLARGINE 100 UNIT/ML ~~LOC~~ SOLN
18.0000 [IU] | Freq: Every day | SUBCUTANEOUS | Status: DC
  Administered 2013-01-08 – 2013-01-09 (×2): 18 [IU] via SUBCUTANEOUS
  Filled 2013-01-08 (×4): qty 0.18

## 2013-01-08 MED ORDER — SODIUM CHLORIDE 0.9 % IJ SOLN
3.0000 mL | Freq: Two times a day (BID) | INTRAMUSCULAR | Status: DC
  Administered 2013-01-08 – 2013-01-09 (×3): 3 mL via INTRAVENOUS

## 2013-01-08 MED ORDER — VANCOMYCIN HCL IN DEXTROSE 1-5 GM/200ML-% IV SOLN
1000.0000 mg | Freq: Once | INTRAVENOUS | Status: AC
  Administered 2013-01-08: 1000 mg via INTRAVENOUS
  Filled 2013-01-08: qty 200

## 2013-01-08 MED ORDER — INSULIN ASPART 100 UNIT/ML ~~LOC~~ SOLN
0.0000 [IU] | Freq: Three times a day (TID) | SUBCUTANEOUS | Status: DC
  Administered 2013-01-09: 2 [IU] via SUBCUTANEOUS
  Administered 2013-01-10: 1 [IU] via SUBCUTANEOUS
  Administered 2013-01-11: 2 [IU] via SUBCUTANEOUS

## 2013-01-08 MED ORDER — ISOSORBIDE MONONITRATE ER 60 MG PO TB24
60.0000 mg | ORAL_TABLET | Freq: Two times a day (BID) | ORAL | Status: DC
  Administered 2013-01-08 – 2013-01-12 (×8): 60 mg via ORAL
  Filled 2013-01-08 (×9): qty 1

## 2013-01-08 MED ORDER — METOPROLOL SUCCINATE ER 100 MG PO TB24
100.0000 mg | ORAL_TABLET | Freq: Two times a day (BID) | ORAL | Status: DC
  Administered 2013-01-08 – 2013-01-11 (×5): 100 mg via ORAL
  Filled 2013-01-08 (×7): qty 1

## 2013-01-08 MED ORDER — VANCOMYCIN HCL 10 G IV SOLR
1500.0000 mg | INTRAVENOUS | Status: DC
  Administered 2013-01-09 – 2013-01-11 (×3): 1500 mg via INTRAVENOUS
  Filled 2013-01-08 (×4): qty 1500

## 2013-01-08 MED ORDER — ONDANSETRON HCL 4 MG/2ML IJ SOLN: 4.0000 mg | Freq: Four times a day (QID) | INTRAMUSCULAR | Status: DC | PRN

## 2013-01-08 MED ORDER — IPRATROPIUM BROMIDE 0.02 % IN SOLN: 0.5000 mg | Freq: Three times a day (TID) | RESPIRATORY_TRACT | Status: DC | PRN

## 2013-01-08 MED ORDER — ACETAMINOPHEN 325 MG PO TABS
650.0000 mg | ORAL_TABLET | Freq: Four times a day (QID) | ORAL | Status: DC | PRN
  Administered 2013-01-08 – 2013-01-12 (×3): 650 mg via ORAL
  Filled 2013-01-08 (×3): qty 2

## 2013-01-08 MED ORDER — VANCOMYCIN HCL IN DEXTROSE 1-5 GM/200ML-% IV SOLN: 1000.0000 mg | INTRAVENOUS | Status: DC

## 2013-01-08 MED ORDER — AMLODIPINE BESYLATE 10 MG PO TABS
10.0000 mg | ORAL_TABLET | Freq: Every day | ORAL | Status: DC
  Filled 2013-01-08: qty 1

## 2013-01-08 MED ORDER — HYDRALAZINE HCL 25 MG PO TABS
25.0000 mg | ORAL_TABLET | Freq: Three times a day (TID) | ORAL | Status: DC
  Administered 2013-01-08 – 2013-01-12 (×11): 25 mg via ORAL
  Filled 2013-01-08 (×13): qty 1

## 2013-01-08 MED ORDER — SODIUM CHLORIDE 0.9 % IV BOLUS (SEPSIS)
1000.0000 mL | Freq: Once | INTRAVENOUS | Status: AC
  Administered 2013-01-08: 1000 mL via INTRAVENOUS

## 2013-01-08 NOTE — Progress Notes (Addendum)
[place] for vancomycin and zosyn Indication: Leg wound infection  Allergies  Allergen Reactions  . Hydrochlorothiazide     Gout , uncontrolled diabetes and renal insufficiency  . Allopurinol     Renal insufficiency    Vital Signs: Temp: 102 F (38.9 C) (08/27 1927) Temp src: Rectal (08/27 1927) BP: 158/73 mmHg (08/27 1825) Pulse Rate: 77 (08/27 1825) Intake/Output from previous day:   Intake/Output from this shift:    Labs: No results found for this basename: WBC, HGB, PLT, LABCREA, CREATININE,  in the last 72 hours The CrCl is unknown because both a height and weight (above a minimum accepted value) are required for this calculation. No results found for this basename: VANCOTROUGH, VANCOPEAK, VANCORANDOM, GENTTROUGH, GENTPEAK, GENTRANDOM, TOBRATROUGH, TOBRAPEAK, TOBRARND, AMIKACINPEAK, AMIKACINTROU, AMIKACIN,  in the last 72 hours   Microbiology: No results found for this or any previous visit (from the past 720 hour(s)).  Medical History: Past Medical History  Diagnosis Date  . Renal insufficiency   . Diabetes mellitus   . Hypertension   . Gout   . Colon polyp 2003    Dr [name]; F/U was to be 2008( not completed)  . Diverticulosis 2003  . Pneumonia 09/29/11    Avelox X 10 days as OP   Assessment: 67 year old male with left leg wound which has worsened over the past 2-3 weeks. Fever of 102 noted in the ED, labs are in process. Vancomycin and zosyn ordered in ED, pharmacy asked to assist with maintenance dosing.  Goal of Therapy:  Vancomycin trough level 10-15 mcg/ml  Plan:  Measure antibiotic drug levels at steady state Follow up culture results Vancomycin 1.5g IV q24 hours - start tomorrow Zosyn 3.375g IV q8 hours - infuse each dose over 4 hours Follow up pending labs and adjust dosing as indicated  Erin Hearing PharmD., BCPS Clinical Pharmacist Pager [phone] 01/08/2013 7:59 PM

## 2013-01-08 NOTE — H&P (Signed)
[facility] History and Physical  MIMS MALTA [id number] DOB: 02/03/1946 DOA: 01/08/2013  Referring physician: ER physician. PCP: Unice Cobble, MD  Chief Complaint: Worsening left leg wound.  HPI: Corey [name] is a 67 y.o. male who was recently admitted for acute respiratory failure with cardiac arrest be secondary to acute pulmonary edema with uncontrolled hypertension and was until recently on PEG tube feeds which was removed 3 weeks ago presents with complaints of increasing left leg drainage and worsening wound. Patient also had subjective feeling of fever and chills. Patient was seen in the ER and was found to have an 8 x 3 cm leg wound with maggots and drainage. X-rays do not show any definite bony involvement. Patient has been admitted for further management. Blood cultures have been sent. Patient has been empirically started on vancomycin and Zosyn. Patient denies chest pain or short of breath. Denies any nausea vomiting abdominal pain or diarrhea. In addition patient is found to have worsening anemia. Last hemoglobin system was around 9. Stool for occult blood was negative.  Review of Systems: As presented in the history of presenting illness, rest negative.  Past Medical History  Diagnosis Date  . Renal insufficiency   . Diabetes mellitus   . Hypertension   . Gout   . Colon polyp 2003    Dr [name]; F/U was to be 2008( not completed)  . Diverticulosis 2003  . Pneumonia 09/29/11    Avelox X 10 days as OP   Past Surgical History  Procedure Laterality Date  . Refractive surgery  2005  . Colonoscopy w/ polypectomy  2003    no F/U ([place] discussed 12/03/12)  . Wrist aspiration  02/16/12     monosodium urate crystals; Dr Caralyn Guile   Social History:  reports that he quit smoking about 44 years ago. He has never used smokeless tobacco. He reports that he does not drink alcohol or use illicit drugs. Home. where does patient live-- Can do ADLs. Can patient participate in  ADLs?  Allergies  Allergen Reactions  . Hydrochlorothiazide     Gout , uncontrolled diabetes and renal insufficiency  . Allopurinol     Renal insufficiency    Family History  Problem Relation Age of Onset  . Hypertension Father   . Prostate cancer Father   . Diabetes Father   . Hypertension Mother   . Diabetes Mother   . Stroke Neg Hx   . Heart disease Neg Hx       Prior to Admission medications   Medication Sig Start Date End Date Taking? Authorizing Provider  amLODipine (NORVASC) 10 MG tablet Take 1 tablet (10 mg total) by mouth daily. 12/03/12  Yes Hendricks Limes, MD  cloNIDine (CATAPRES) 0.1 MG tablet Take 1 tablet (0.1 mg total) by mouth daily. 12/03/12  Yes Hendricks Limes, MD  hydrALAZINE (APRESOLINE) 25 MG tablet Take 25 mg by mouth. 3 BY MOUTH THREE TIMES DAILY   Yes Historical Provider, MD  insulin glargine (LANTUS) 100 UNIT/ML injection Inject 18 Units into the skin daily. 10/02/12  Yes Donita Brooks, NP  ipratropium-albuterol (DUONEB) 0.5-2.5 (3) MG/3ML SOLN Take 3 mLs by nebulization 3 (three) times daily as needed.   Yes Historical Provider, MD  isosorbide mononitrate (IMDUR) 60 MG 24 hr tablet Take 1 tablet (60 mg total) by mouth 2 (two) times daily. 12/03/12  Yes Hendricks Limes, MD  metoprolol succinate (TOPROL-XL) 100 MG 24 hr tablet Take 1 tablet (100 mg total)  by mouth 2 (two) times daily. Take with or immediately following a meal. 12/03/12  Yes Hendricks Limes, MD  tamsulosin (FLOMAX) 0.4 MG CAPS Take 1 capsule (0.4 mg total) by mouth daily. 12/03/12  Yes Hendricks Limes, MD   Physical Exam: Filed Vitals:   01/08/13 1825 01/08/13 1927 01/08/13 1958 01/08/13 2101  BP: 158/73  144/78 140/74  Pulse: 77     Temp: 100.2 F (37.9 C) 102 F (38.9 C) 101.1 F (38.4 C) 99.8 F (37.7 C)  TempSrc: Oral Rectal Oral Oral  Resp: 16  18 17   SpO2: 99%  99% 99%     General:  Well-developed well-nourished.  Eyes: Anicteric no pallor.  ENT: No discharge from  ears eyes nose mouth.  Neck: No mass felt.  Cardiovascular: S1-S2 heard.  Respiratory: No rhonchi or crepitations.  Abdomen: Soft nontender bowel sounds present.  Skin: Left leg is already dressed up. As per the ER physician there was an 8 x 3 cm wound with maggots.  Musculoskeletal: No edema. Skin changes as described.  Psychiatric: Appears normal.  Neurologic: Alert awake oriented to time place and person. Moves all extremities.  Labs on Admission:  Basic Metabolic Panel:  Recent Labs Lab 01/08/13 1910  NA 137  K 4.2  CL 103  CO2 21  GLUCOSE 110*  BUN 21  CREATININE 1.70*  CALCIUM 10.3   Liver Function Tests: No results found for this basename: AST, ALT, ALKPHOS, BILITOT, PROT, ALBUMIN,  in the last 168 hours No results found for this basename: LIPASE, AMYLASE,  in the last 168 hours No results found for this basename: AMMONIA,  in the last 168 hours CBC:  Recent Labs Lab 01/08/13 1910  WBC 8.9  NEUTROABS 6.3  HGB 6.7*  HCT 21.0*  MCV 76.9*  PLT 333   Cardiac Enzymes: No results found for this basename: CKTOTAL, CKMB, CKMBINDEX, TROPONINI,  in the last 168 hours  BNP (last 3 results)  Recent Labs  10/08/12 0530 10/14/12 1155 10/18/12 0555  PROBNP 4480.0* 2879.0* 15529.0*   CBG:  Recent Labs Lab 01/08/13 2021  GLUCAP 114*    Radiological Exams on Admission: Dg Tibia/fibula Left  01/08/2013   *RADIOLOGY REPORT*  Clinical Data: Wound infection  LEFT TIBIA AND FIBULA - 2 VIEW  Comparison: None.  Findings: Soft tissue changes are noted consistent with the patient's given clinical history.  Diffuse vascular calcifications are noted.  Some mild remodelling of the proximal tibia is seen likely related to prior injury and healing.  No acute fracture or the the patient is noted.  IMPRESSION: Soft tissue changes without acute abnormality noted.   Original Report Authenticated By: Inez Catalina, M.D.     Assessment/Plan Principal Problem:   Leg wound,  left Active Problems:   HYPERTENSION, ESSENTIAL NOS   Type II or unspecified type diabetes mellitus with renal manifestations, uncontrolled(250.42)   Cellulitis   Anemia   1. Left leg wound with cellulitis - patient has been started on vancomycin and Zosyn. Follow cultures. Check MRI. Wound consult. 2. Severe anemia - probably secondary to chronic kidney disease. At this time one unit of packed red blood cell transfusion has been ordered. Follow CBC. Stool for occult blood was negative. 3. Diabetes mellitus type 2 - per endocrinologist note recently patient's diabetes was controlled. Continue present medications with sliding-scale coverage. 4. Hypertension - continue home medications. 5. History of CHF - presently looks compensated. 6. Chronic kidney disease - creatinine appears at baseline. Closely follow  intake output and metabolic panel. 7. History of cardiac arrest.    Code Status: Full code.  Family Communication: Patient's wife at the bedside.  Disposition Plan: Admit to inpatient.    Hafiz Irion N. [facility] Pager [phone].  If 7PM-7AM, please contact night-coverage [email or web address] Password [facility] 01/08/2013, 9:24 PM

## 2013-01-08 NOTE — ED Provider Notes (Signed)
CSN: [id number]     Arrival date & time 01/08/13  1821 History   First MD Initiated Contact with Patient 01/08/13 1836     Chief Complaint  Patient presents with  . Wound Infection   (Consider location/radiation/quality/duration/timing/severity/associated sxs/prior Treatment) HPI Comments: 67 year old male presents with 2-3 weeks of a left leg infection. He states it has been worsening over this time. Denies any injuries. Start off small and has been slowly growing. He has had chills at night. Did not notice any specific drainage, but his wife noticed "bugs crawling on his wound". His leg is then swelling and has had increased pain. He denies any new numbness or weakness. Denies any fevers. He states his glucoses been well controlled in the low 100s.  The history is provided by the patient.    Past Medical History  Diagnosis Date  . Renal insufficiency   . Diabetes mellitus   . Hypertension   . Gout   . Colon polyp 2003    Dr [name]; F/U was to be 2008( not completed)  . Diverticulosis 2003  . Pneumonia 09/29/11    Avelox X 10 days as OP   Past Surgical History  Procedure Laterality Date  . Refractive surgery  2005  . Colonoscopy w/ polypectomy  2003    no F/U ([employer] discussed 12/03/12)  . Wrist aspiration  02/16/12     monosodium urate crystals; Dr Caralyn Guile   Family History  Problem Relation Age of Onset  . Hypertension Father   . Prostate cancer Father   . Diabetes Father   . Hypertension Mother   . Diabetes Mother   . Stroke Neg Hx   . Heart disease Neg Hx    History  Substance Use Topics  . Smoking status: Former Smoker    Quit date: 05/15/1968  . Smokeless tobacco: Never Used     Comment: smoked [place], up to 1-2 cigarettes/ day  . Alcohol Use: No    Review of Systems  Constitutional: Positive for chills. Negative for fever.  Cardiovascular: Positive for leg swelling.  Musculoskeletal:       Left leg pain  Skin: Positive for wound.  Neurological:  Negative for weakness and numbness.  All other systems reviewed and are negative.    Allergies  Hydrochlorothiazide and Allopurinol  Home Medications   Current Outpatient Rx  Name  Route  Sig  Dispense  Refill  . amLODipine (NORVASC) 10 MG tablet   Oral   Take 1 tablet (10 mg total) by mouth daily.   90 tablet   0   . cloNIDine (CATAPRES) 0.1 MG tablet   Oral   Take 1 tablet (0.1 mg total) by mouth daily.   90 tablet   0   . feeding supplement (PRO-STAT SUGAR FREE 64) LIQD   Per Tube   Place 60 mLs into feeding tube 3 (three) times daily.   900 mL   0   . hydrALAZINE (APRESOLINE) 25 MG tablet   Oral   Take 25 mg by mouth. 3 BY MOUTH THREE TIMES DAILY         . insulin glargine (LANTUS) 100 UNIT/ML injection   Subcutaneous   Inject 18 Units into the skin daily.         Marland Kitchen ipratropium-albuterol (DUONEB) 0.5-2.5 (3) MG/3ML SOLN   Nebulization   Take 3 mLs by nebulization 3 (three) times daily as needed.         . isosorbide mononitrate (IMDUR) 60 MG  24 hr tablet   Oral   Take 1 tablet (60 mg total) by mouth 2 (two) times daily.   180 tablet   0   . metoprolol succinate (TOPROL-XL) 100 MG 24 hr tablet   Oral   Take 1 tablet (100 mg total) by mouth 2 (two) times daily. Take with or immediately following a meal.   180 tablet   0   . tamsulosin (FLOMAX) 0.4 MG CAPS   Oral   Take 1 capsule (0.4 mg total) by mouth daily.   90 capsule   0    BP 158/73  Pulse 77  Temp(Src) 100.2 F (37.9 C) (Oral)  Resp 16  SpO2 99% Physical Exam  Nursing note and vitals reviewed. Constitutional: He is oriented to person, place, and time. He appears well-developed and well-nourished. No distress.  HENT:  Head: Normocephalic and atraumatic.  Right Ear: External ear normal.  Left Ear: External ear normal.  Nose: Nose normal.  Eyes: Right eye exhibits no discharge. Left eye exhibits no discharge.  Neck: Neck supple.  Cardiovascular: Normal rate, regular rhythm,  normal heart sounds and intact distal pulses.   Pulses:      Dorsalis pedis pulses are 2+ on the right side, and 2+ on the left side.  Pulmonary/Chest: Effort normal and breath sounds normal.  Abdominal: Soft. There is no tenderness.  Musculoskeletal: He exhibits no edema.       Left lower leg: He exhibits tenderness and swelling.       Legs: Neurological: He is alert and oriented to person, place, and time. He has normal strength. No sensory deficit.  Skin: Skin is warm. There is erythema.    ED Course  Procedures (including critical care time) Labs Review Labs Reviewed  BASIC METABOLIC PANEL - Abnormal; Notable for the following:    Glucose, Bld 110 (*)    Creatinine, Ser 1.70 (*)    GFR calc non Af Amer 41 (*)    GFR calc Af Amer 47 (*)    All other components within normal limits  CBC WITH DIFFERENTIAL - Abnormal; Notable for the following:    RBC 2.73 (*)    Hemoglobin 6.7 (*)    HCT 21.0 (*)    MCV 76.9 (*)    MCH 24.5 (*)    RDW 15.7 (*)    All other components within normal limits  GLUCOSE, CAPILLARY - Abnormal; Notable for the following:    Glucose-Capillary 114 (*)    All other components within normal limits  CULTURE, BLOOD (ROUTINE X 2)  CULTURE, BLOOD (ROUTINE X 2)  CG4 I-STAT (LACTIC ACID)  OCCULT BLOOD, POC DEVICE  PREPARE RBC (CROSSMATCH)  TYPE AND SCREEN   Imaging Review Dg Tibia/fibula Left  01/08/2013   *RADIOLOGY REPORT*  Clinical Data: Wound infection  LEFT TIBIA AND FIBULA - 2 VIEW  Comparison: None.  Findings: Soft tissue changes are noted consistent with the patient's given clinical history.  Diffuse vascular calcifications are noted.  Some mild remodelling of the proximal tibia is seen likely related to prior injury and healing.  No acute fracture or the the patient is noted.  IMPRESSION: Soft tissue changes without acute abnormality noted.   Original Report Authenticated By: Inez Catalina, M.D.    MDM   1. Leg wound, left, initial encounter   2.  Sepsis   3. Anemia    Large anterior lower leg wound on left leg with foul odor and maggots. NV intact. Has fever as well,  controlled with tylenol. Blood cultures obtained, no gas seen on Xray. Will treat with fluids and vanc/zosyn. Hospitalist consulted for admission.    Ephraim Hamburger, MD 01/09/13 [phone]

## 2013-01-08 NOTE — ED Notes (Signed)
Pt reports (L) leg wound infection x 3-4 months.  Noted to have pus coming out of his leg with necrotic tissue.  Pt also noted to have an odor from leg.  Pt denies injury.  Pt reports that he has not seen a doctor for his leg.  Pt reports pain that started 2-3 weeks ago.

## 2013-01-09 ENCOUNTER — Encounter (HOSPITAL_COMMUNITY): Payer: Self-pay | Admitting: *Deleted

## 2013-01-09 ENCOUNTER — Inpatient Hospital Stay (HOSPITAL_COMMUNITY): Payer: No Typology Code available for payment source

## 2013-01-09 DIAGNOSIS — I10 Essential (primary) hypertension: Secondary | ICD-10-CM

## 2013-01-09 LAB — GLUCOSE, CAPILLARY
Glucose-Capillary: 77 mg/dL (ref 70–99)
Glucose-Capillary: 117 mg/dL — ABNORMAL HIGH (ref 70–99)
Glucose-Capillary: 168 mg/dL — ABNORMAL HIGH (ref 70–99)
Glucose-Capillary: 136 mg/dL — ABNORMAL HIGH (ref 70–99)

## 2013-01-09 LAB — COMPREHENSIVE METABOLIC PANEL
ALT: 35 U/L (ref 0–53)
AST: 46 U/L — ABNORMAL HIGH (ref 0–37)
Albumin: 2.5 g/dL — ABNORMAL LOW (ref 3.5–5.2)
Alkaline Phosphatase: 61 U/L (ref 39–117)
BUN: 20 mg/dL (ref 6–23)
CO2: 22 mEq/L (ref 19–32)
Calcium: 9.8 mg/dL (ref 8.4–10.5)
Chloride: 108 mEq/L (ref 96–112)
Creatinine, Ser: 1.62 mg/dL — ABNORMAL HIGH (ref 0.50–1.35)
GFR calc Af Amer: 50 mL/min — ABNORMAL LOW (ref >90)
GFR calc non Af Amer: 43 mL/min — ABNORMAL LOW (ref >90)
Glucose, Bld: 85 mg/dL (ref 70–99)
Potassium: 3.8 mEq/L (ref 3.5–5.1)
Sodium: 140 mEq/L (ref 135–145)
Total Bilirubin: 0.7 mg/dL (ref 0.3–1.2)
Total Protein: 6.9 g/dL (ref 6.0–8.3)

## 2013-01-09 LAB — CBC WITH DIFFERENTIAL/PLATELET
Basophils Absolute: 0 10*3/uL (ref 0.0–0.1)
Basophils Relative: 0 % (ref 0–1)
Eosinophils Absolute: 0.1 10*3/uL (ref 0.0–0.7)
Hemoglobin: 7.4 g/dL — ABNORMAL LOW (ref 13.0–17.0)
MCH: 25.7 pg — ABNORMAL LOW (ref 26.0–34.0)
MCHC: 32.9 g/dL (ref 30.0–36.0)
Neutro Abs: 5.2 10*3/uL (ref 1.7–7.7)
Neutrophils Relative %: 69 % (ref 43–77)
Platelets: 284 10*3/uL (ref 150–400)
RBC: 2.88 MIL/uL — ABNORMAL LOW (ref 4.22–5.81)

## 2013-01-09 LAB — CREATININE, SERUM
Creatinine, Ser: 1.72 mg/dL — ABNORMAL HIGH (ref 0.50–1.35)
GFR calc Af Amer: 47 mL/min — ABNORMAL LOW (ref >90)
GFR calc non Af Amer: 40 mL/min — ABNORMAL LOW (ref >90)

## 2013-01-09 LAB — SEDIMENTATION RATE: Sed Rate: 120 mm/hr — ABNORMAL HIGH (ref 0–16)

## 2013-01-09 MED ORDER — GADOBENATE DIMEGLUMINE 529 MG/ML IV SOLN
17.0000 mL | Freq: Once | INTRAVENOUS | Status: AC
  Administered 2013-01-09: 17 mL via INTRAVENOUS

## 2013-01-09 MED ORDER — COLLAGENASE 250 UNIT/GM EX OINT
TOPICAL_OINTMENT | Freq: Every day | CUTANEOUS | Status: DC
  Administered 2013-01-09: 15:00:00 via TOPICAL
  Administered 2013-01-10: 1 via TOPICAL
  Administered 2013-01-11 – 2013-01-12 (×2): via TOPICAL
  Filled 2013-01-09: qty 30

## 2013-01-09 MED ORDER — AMLODIPINE BESYLATE 10 MG PO TABS
10.0000 mg | ORAL_TABLET | Freq: Once | ORAL | Status: AC
  Administered 2013-01-09: 10 mg via ORAL
  Filled 2013-01-09: qty 1

## 2013-01-09 MED ORDER — AMLODIPINE BESYLATE 10 MG PO TABS
10.0000 mg | ORAL_TABLET | Freq: Two times a day (BID) | ORAL | Status: DC
  Administered 2013-01-09 – 2013-01-12 (×6): 10 mg via ORAL
  Filled 2013-01-09 (×8): qty 1

## 2013-01-09 MED ORDER — OXYCODONE HCL 5 MG PO TABS
5.0000 mg | ORAL_TABLET | Freq: Four times a day (QID) | ORAL | Status: DC | PRN
  Administered 2013-01-09 – 2013-01-11 (×3): 5 mg via ORAL
  Filled 2013-01-09 (×3): qty 1

## 2013-01-09 NOTE — Care Management Note (Unsigned)
    Page 1 of 2   01/10/2013     10:05:02 AM   CARE MANAGEMENT NOTE 01/10/2013  Patient:  Corey [name], Corey [name]   Account Number:  [id number]  Date Initiated:  01/09/2013  Documentation initiated by:  Tomi Bamberger  Subjective/Objective Assessment:   dx left leg wound cellulitis, anemia  admit- lives with son     Action/Plan:   HHRN for dressing changes   Anticipated DC Date:  01/10/2013   Anticipated DC Plan:  [place]  CM consult      [facility] Choice  HOME HEALTH   Choice offered to / List presented to:  C-1 Patient        [place] arranged  HH-1 RN      [place].   Status of service:  In process, will continue to follow Medicare Important Message given?   (If response is "NO", the following Medicare IM given date fields will be blank) Date Medicare IM given:   Date Additional Medicare IM given:    Discharge Disposition:    Per UR Regulation:  Reviewed for med. necessity/level of care/duration of stay  If discussed at [place] of Stay Meetings, dates discussed:    Comments:  01/10/13 10:02 Tomi Bamberger RN, BSN [phone] patient lives with son, patient will need [facility] for dressing changes, patient chose [facility] from agency list, referral made to [facility], [place] notified.  Soc will begin 24-48 hrs post discharge.  Patient will probably be dc on Sunday.  01/09/13 15:47 Tomi Bamberger RN, BSN [phone] patient lives with son, patient will need [facility] for wound dressing changes, will offer choices to patient.

## 2013-01-09 NOTE — Consult Note (Addendum)
WOC consult Note Reason for Consult: Consult requested for left leg wound.  Pt states wound has been present over a month and he was putting Kaopectate on it to promote healing. He does not recall etiology of the wound. He decided to come to the hospital when his wife saw maggots in the wound. Wound type: Full thickness to left anterior calf. Measurement: Refer to bedside nurse's excellent measurements and assessment on doc flow sheet:[id number].02cm Wound bed: 60% black eschar, 20% yellow slough, 20% red. Drainage (amount, consistency, odor)  Some odor, small amt yellow drainage, visible maggots crawling out of the wound bed. Periwound: Generalized erythremia surrounding wound.  Good pedal pulses palpable. Dressing procedure/placement/frequency: Santyl for chemical debridement of nonviable tissue.  MRI pending to R/O osteomyelitis.  If this is positive, then ortho consult would be indicated. Ortho consult should also be considered if aggressive debridement is desired at this time. Pt could benefit from follow-up at the outpatient wound care center upon discharge for further debridement as nonviable tissue softens.  If this is desired plan of care, then it requires physician referral.    Julien Girt MSN, [place], [place], [place], [place]

## 2013-01-09 NOTE — Progress Notes (Signed)
Pt admitted to unit from ED. Pt is A&O and VS stable. Pt has a 19.5cm x 9cm wound on his left leg. Pt is currently resting comfortably in bed with his wife at bedside.

## 2013-01-09 NOTE — Progress Notes (Signed)
[facility] PROGRESS NOTE  Corey [name] [id number] DOB: 06/25/1945 DOA: 01/08/2013 PCP: Unice Cobble, MD  Corey [name] is a 67 y.o. male who was recently admitted for acute respiratory failure with cardiac arrest be secondary to acute pulmonary edema with uncontrolled hypertension and was until recently on PEG tube feeds which was removed 3 weeks ago presents with complaints of increasing left leg drainage and worsening wound. Patient also had subjective feeling of fever and chills. Patient was seen in the ER and was found to have an 8 x 3 cm leg wound with maggots and drainage. X-rays do not show any definite bony involvement.   Assessment/Plan:  Left leg wound with cellulitis- with maggots patient has been started on vancomycin and Zosyn-day 2 Follow cultures.  Check MRI.  Wound consult. Consulted orthopedics to see if patient a candidate for further debridement Please see picture at the bottom of this note.  Severe anemia Probably secondary to chronic kidney disease/chronic leg ulcer Received 1 unit PRBCs on 8/7 Stool for occult blood was negative.  Transfuse if less than 7  Diabetes mellitus type 2 controlled. A1C 7.4 on 11/08/2012 Continue present medications with sliding-scale coverage.   Hypertension  Continue home medications (metoprolol, amlodipine, imdur)  History of CHF  Presently looks compensated.  On Metoprolol  Chronic kidney disease Creatinine at baseline.   History of cardiac arrest. Stable.  No acute issues.   DVT Prophylaxis:  lovenox  Code Status: full  Family Communication:  Disposition Plan: to home when able.  Likely with home health RN for wound care.   Consultants:    Procedures:    Antibiotics: Vanc / Zosyn started at admission  HPI/Subjective:   Objective: Filed Vitals:   01/09/13 0115 01/09/13 0206 01/09/13 0603 01/09/13 1009  BP: 125/70 142/73 134/69 131/68  Pulse: 64 56 53 63  Temp: 98.3 F (36.8 C) 98.2 F  (36.8 C) 98.4 F (36.9 C)   TempSrc: Oral Oral Oral   Resp: 19 20 18    Height:      Weight:      SpO2: 97%  99%     Intake/Output Summary (Last 24 hours) at 01/09/13 1128 Last data filed at 01/09/13 0204  Gross per 24 hour  Intake  612.5 ml  Output      0 ml  Net  612.5 ml   Filed Weights   01/08/13 2225  Weight: 83 kg (182 lb 15.7 oz)    Exam:   General:  A&O, NAD, Lying comfortably in bed  Cardiovascular: RRR, no murmurs, rubs or gallops, no lower extremity edema  Respiratory: CTA, no wheeze, crackles, or rales.  No increased work of breathing.  Abdomen: Soft, non-tender, non-distended, + bowel sounds, no masses.  Gastrostomy well healed.  Musculoskeletal: Able to move all 4 extremities, 5/5 strength in each.  LLE tender to palpation or movement.  Wound bed approximately 10 inches x 5 inches with 2 maggots noted. No odor or drainage.  Data Reviewed: Basic Metabolic Panel:  Recent Labs Lab 01/08/13 1910 01/08/13 2235 01/09/13 0510  NA 137  --  140  K 4.2  --  3.8  CL 103  --  108  CO2 21  --  22  GLUCOSE 110*  --  85  BUN 21  --  20  CREATININE 1.70* 1.72* 1.62*  CALCIUM 10.3  --  9.8   Liver Function Tests:  Recent Labs Lab 01/09/13 0510  AST 46*  ALT 35  ALKPHOS 61  BILITOT 0.7  PROT 6.9  ALBUMIN 2.5*   CBC:  Recent Labs Lab 01/08/13 1910 01/08/13 2235 01/09/13 0510  WBC 8.9 8.1 7.6  NEUTROABS 6.3  --  5.2  HGB 6.7* 6.7* 7.4*  HCT 21.0* 21.1* 22.5*  MCV 76.9* 76.4* 78.1  PLT 333 299 284     Recent Labs  10/08/12 0530 10/14/12 1155 10/18/12 0555  PROBNP 4480.0* 2879.0* 15529.0*   CBG:  Recent Labs Lab 01/08/13 2021 01/08/13 2222 01/09/13 0741  GLUCAP 114* 133* 77    No results found for this or any previous visit (from the past 240 hour(s)).   Studies: Dg Tibia/fibula Left  01/08/2013   *RADIOLOGY REPORT*  Clinical Data: Wound infection  LEFT TIBIA AND FIBULA - 2 VIEW  Comparison: None.  Findings: Soft tissue  changes are noted consistent with the patient's given clinical history.  Diffuse vascular calcifications are noted.  Some mild remodelling of the proximal tibia is seen likely related to prior injury and healing.  No acute fracture or the the patient is noted.  IMPRESSION: Soft tissue changes without acute abnormality noted.   Original Report Authenticated By: Inez Catalina, M.D.    Scheduled Meds: . amLODipine  10 mg Oral Q12H  . amLODipine  10 mg Oral Once  . cloNIDine  0.1 mg Oral Daily  . enoxaparin (LOVENOX) injection  40 mg Subcutaneous Q24H  . hydrALAZINE  25 mg Oral TID  . insulin aspart  0-9 Units Subcutaneous TID WC  . insulin glargine  18 Units Subcutaneous QHS  . isosorbide mononitrate  60 mg Oral BID  . metoprolol succinate  100 mg Oral BID  . piperacillin-tazobactam (ZOSYN)  IV  3.375 g Intravenous Q8H  . sodium chloride  3 mL Intravenous Q12H  . sodium chloride  3 mL Intravenous Q12H  . tamsulosin  0.4 mg Oral Daily  . vancomycin  1,500 mg Intravenous Q24H   Continuous Infusions:   Principal Problem:   Leg wound, left Active Problems:   HYPERTENSION, ESSENTIAL NOS   Type II or unspecified type diabetes mellitus with renal manifestations, uncontrolled(250.42)   Cellulitis   Anemia    [place], [place] [facility] Pager [phone]. If 7PM-7AM, please contact night-coverage at [email or web address], password [facility] 01/09/2013, 11:28 AM  LOS: 1 day   Attending Patient seen and examined, agree with the above assessment and plan. Left leg wound for the past one-one and a half months, found to have maggots on presentation. On care and orthopedic consults have been ordered. Continue with empiric antibiotics. Monitor hemoglobin closely, suspect this is anemia of chronic disease.  S Ghimire

## 2013-01-10 LAB — GLUCOSE, CAPILLARY
Glucose-Capillary: 53 mg/dL — ABNORMAL LOW (ref 70–99)
Glucose-Capillary: 83 mg/dL (ref 70–99)
Glucose-Capillary: 99 mg/dL (ref 70–99)

## 2013-01-10 LAB — CBC
Hemoglobin: 7.7 g/dL — ABNORMAL LOW (ref 13.0–17.0)
MCH: 25.5 pg — ABNORMAL LOW (ref 26.0–34.0)
MCV: 77.5 fL — ABNORMAL LOW (ref 78.0–100.0)
Platelets: 329 10*3/uL (ref 150–400)
RBC: 3.02 MIL/uL — ABNORMAL LOW (ref 4.22–5.81)
WBC: 7.6 10*3/uL (ref 4.0–10.5)

## 2013-01-10 LAB — BASIC METABOLIC PANEL
CO2: 21 mEq/L (ref 19–32)
Calcium: 10 mg/dL (ref 8.4–10.5)
Chloride: 107 mEq/L (ref 96–112)
Creatinine, Ser: 1.64 mg/dL — ABNORMAL HIGH (ref 0.50–1.35)
Glucose, Bld: 59 mg/dL — ABNORMAL LOW (ref 70–99)

## 2013-01-10 MED ORDER — INSULIN GLARGINE 100 UNIT/ML ~~LOC~~ SOLN
10.0000 [IU] | Freq: Once | SUBCUTANEOUS | Status: AC
  Administered 2013-01-10: 10 [IU] via SUBCUTANEOUS
  Filled 2013-01-10: qty 0.1

## 2013-01-10 NOTE — Consult Note (Signed)
Reason for Consult: Traumatic necrotic left anterior tibial wound.  Referring Physician:Dr Ghimire  Corey [name] is an 67 y.o. male.  HPI: Patient is a 66 year old gentleman who presents with a necrotic wound left anterior tibial with maggots present. Patient is unsure of the etiology of the wound.  Past Medical History  Diagnosis Date  . Renal insufficiency   . Diabetes mellitus   . Hypertension   . Gout   . Colon polyp 2003    Dr [name]; F/U was to be 2008( not completed)  . Diverticulosis 2003  . Pneumonia 09/29/11    [facility] X 10 days as OP    Past Surgical History  Procedure Laterality Date  . Refractive surgery  2005  . Colonoscopy w/ polypectomy  2003    no F/U ([place] discussed 12/03/12)  . Wrist aspiration  02/16/12     monosodium urate crystals; Dr Caralyn Guile    Family History  Problem Relation Age of Onset  . Hypertension Father   . Prostate cancer Father   . Diabetes Father   . Hypertension Mother   . Diabetes Mother   . Stroke Neg Hx   . Heart disease Neg Hx     Social History:  reports that he quit smoking about 44 years ago. He has never used smokeless tobacco. He reports that he does not drink alcohol or use illicit drugs.  Allergies:  Allergies  Allergen Reactions  . Hydrochlorothiazide     Gout , uncontrolled diabetes and renal insufficiency  . Allopurinol     Renal insufficiency    Medications: I have reviewed the patient's current medications.  Results for orders placed during the hospital encounter of 01/08/13 (from the past 48 hour(s))  BASIC METABOLIC PANEL     Status: Abnormal   Collection Time    01/08/13  7:10 PM      Result Value Range   Sodium 137  135 - 145 mEq/L   Potassium 4.2  3.5 - 5.1 mEq/L   Chloride 103  96 - 112 mEq/L   CO2 21  19 - 32 mEq/L   Glucose, Bld 110 (*) 70 - 99 mg/dL   BUN 21  6 - 23 mg/dL   Creatinine, Ser 1.70 (*) 0.50 - 1.35 mg/dL   Calcium 10.3  8.4 - 10.5 mg/dL   GFR calc non Af Amer 41 (*) >90 mL/min    GFR calc Af Amer 47 (*) >90 mL/min   Comment: (NOTE)     The eGFR has been calculated using the CKD EPI equation.     This calculation has not been validated in all clinical situations.     eGFR's persistently <90 mL/min signify possible Chronic Kidney     Disease.  CBC WITH DIFFERENTIAL     Status: Abnormal   Collection Time    01/08/13  7:10 PM      Result Value Range   WBC 8.9  4.0 - 10.5 K/uL   RBC 2.73 (*) 4.22 - 5.81 MIL/uL   Hemoglobin 6.7 (*) 13.0 - 17.0 g/dL   Comment: REPEATED TO VERIFY     CRITICAL RESULT CALLED TO, READ BACK BY AND VERIFIED WITH:     E POULOSE RN 1958 01/08/13 A BROWNING   HCT 21.0 (*) 39.0 - 52.0 %   MCV 76.9 (*) 78.0 - 100.0 fL   MCH 24.5 (*) 26.0 - 34.0 pg   MCHC 31.9  30.0 - 36.0 g/dL   RDW 15.7 (*) 11.5 -  15.5 %   Platelets 333  150 - 400 K/uL   Neutrophils Relative % 71  43 - 77 %   Neutro Abs 6.3  1.7 - 7.7 K/uL   Lymphocytes Relative 19  12 - 46 %   Lymphs Abs 1.7  0.7 - 4.0 K/uL   Monocytes Relative 9  3 - 12 %   Monocytes Absolute 0.8  0.1 - 1.0 K/uL   Eosinophils Relative 1  0 - 5 %   Eosinophils Absolute 0.1  0.0 - 0.7 K/uL   Basophils Relative 0  0 - 1 %   Basophils Absolute 0.0  0.0 - 0.1 K/uL  CG4 I-STAT (LACTIC ACID)     Status: None   Collection Time    01/08/13  7:37 PM      Result Value Range   Lactic Acid, Venous 1.28  0.5 - 2.2 mmol/L  OCCULT BLOOD, POC DEVICE     Status: None   Collection Time    01/08/13  8:19 PM      Result Value Range   Fecal Occult Bld NEGATIVE  NEGATIVE  GLUCOSE, CAPILLARY     Status: Abnormal   Collection Time    01/08/13  8:21 PM      Result Value Range   Glucose-Capillary 114 (*) 70 - 99 mg/dL  PREPARE RBC (CROSSMATCH)     Status: None   Collection Time    01/08/13  8:27 PM      Result Value Range   Order Confirmation ORDER PROCESSED BY BLOOD BANK    TYPE AND SCREEN     Status: None   Collection Time    01/08/13  8:27 PM      Result Value Range   ABO/RH(D) A POS     Antibody Screen NEG      Sample Expiration 01/11/2013     Unit Number [id number]     Blood Component Type RED CELLS,LR     Unit division 00     Status of Unit ISSUED,FINAL     Transfusion Status OK TO TRANSFUSE     Crossmatch Result Compatible     Unit Number [id number]     Blood Component Type RED CELLS,LR     Unit division 00     Status of Unit ALLOCATED     Transfusion Status OK TO TRANSFUSE     Crossmatch Result Compatible    GLUCOSE, CAPILLARY     Status: Abnormal   Collection Time    01/08/13 10:22 PM      Result Value Range   Glucose-Capillary 133 (*) 70 - 99 mg/dL   Comment 1 Notify RN     Comment 2 Documented in Chart    SEDIMENTATION RATE     Status: Abnormal   Collection Time    01/08/13 10:35 PM      Result Value Range   Sed Rate 120 (*) 0 - 16 mm/hr  CBC     Status: Abnormal   Collection Time    01/08/13 10:35 PM      Result Value Range   WBC 8.1  4.0 - 10.5 K/uL   RBC 2.76 (*) 4.22 - 5.81 MIL/uL   Hemoglobin 6.7 (*) 13.0 - 17.0 g/dL   Comment: REPEATED TO VERIFY     CRITICAL RESULT CALLED TO, READ BACK BY AND VERIFIED WITH:     Enid Derry RN [id number] R   HCT 21.1 (*) 39.0 - 52.0 %   MCV 76.4 (*)  78.0 - 100.0 fL   MCH 24.3 (*) 26.0 - 34.0 pg   MCHC 31.8  30.0 - 36.0 g/dL   RDW 15.7 (*) 11.5 - 15.5 %   Platelets 299  150 - 400 K/uL  CREATININE, SERUM     Status: Abnormal   Collection Time    01/08/13 10:35 PM      Result Value Range   Creatinine, Ser 1.72 (*) 0.50 - 1.35 mg/dL   GFR calc non Af Amer 40 (*) >90 mL/min   GFR calc Af Amer 47 (*) >90 mL/min   Comment: (NOTE)     The eGFR has been calculated using the CKD EPI equation.     This calculation has not been validated in all clinical situations.     eGFR's persistently <90 mL/min signify possible Chronic Kidney     Disease.  COMPREHENSIVE METABOLIC PANEL     Status: Abnormal   Collection Time    01/09/13  5:10 AM      Result Value Range   Sodium 140  135 - 145 mEq/L   Potassium 3.8  3.5 - 5.1 mEq/L    Chloride 108  96 - 112 mEq/L   CO2 22  19 - 32 mEq/L   Glucose, Bld 85  70 - 99 mg/dL   BUN 20  6 - 23 mg/dL   Creatinine, Ser 1.62 (*) 0.50 - 1.35 mg/dL   Calcium 9.8  8.4 - 10.5 mg/dL   Total Protein 6.9  6.0 - 8.3 g/dL   Albumin 2.5 (*) 3.5 - 5.2 g/dL   AST 46 (*) 0 - 37 U/L   ALT 35  0 - 53 U/L   Alkaline Phosphatase 61  39 - 117 U/L   Total Bilirubin 0.7  0.3 - 1.2 mg/dL   GFR calc non Af Amer 43 (*) >90 mL/min   GFR calc Af Amer 50 (*) >90 mL/min   Comment: (NOTE)     The eGFR has been calculated using the CKD EPI equation.     This calculation has not been validated in all clinical situations.     eGFR's persistently <90 mL/min signify possible Chronic Kidney     Disease.  CBC WITH DIFFERENTIAL     Status: Abnormal   Collection Time    01/09/13  5:10 AM      Result Value Range   WBC 7.6  4.0 - 10.5 K/uL   RBC 2.88 (*) 4.22 - 5.81 MIL/uL   Hemoglobin 7.4 (*) 13.0 - 17.0 g/dL   HCT 22.5 (*) 39.0 - 52.0 %   MCV 78.1  78.0 - 100.0 fL   MCH 25.7 (*) 26.0 - 34.0 pg   MCHC 32.9  30.0 - 36.0 g/dL   RDW 15.8 (*) 11.5 - 15.5 %   Platelets 284  150 - 400 K/uL   Neutrophils Relative % 69  43 - 77 %   Neutro Abs 5.2  1.7 - 7.7 K/uL   Lymphocytes Relative 21  12 - 46 %   Lymphs Abs 1.6  0.7 - 4.0 K/uL   Monocytes Relative 8  3 - 12 %   Monocytes Absolute 0.6  0.1 - 1.0 K/uL   Eosinophils Relative 2  0 - 5 %   Eosinophils Absolute 0.1  0.0 - 0.7 K/uL   Basophils Relative 0  0 - 1 %   Basophils Absolute 0.0  0.0 - 0.1 K/uL  GLUCOSE, CAPILLARY     Status: None  Collection Time    01/09/13  7:41 AM      Result Value Range   Glucose-Capillary 77  70 - 99 mg/dL   Comment 1 Documented in Chart     Comment 2 Notify RN    GLUCOSE, CAPILLARY     Status: Abnormal   Collection Time    01/09/13 12:52 PM      Result Value Range   Glucose-Capillary 117 (*) 70 - 99 mg/dL   Comment 1 Documented in Chart     Comment 2 Notify RN    GLUCOSE, CAPILLARY     Status: Abnormal    Collection Time    01/09/13  4:56 PM      Result Value Range   Glucose-Capillary 168 (*) 70 - 99 mg/dL   Comment 1 Documented in Chart     Comment 2 Notify RN    GLUCOSE, CAPILLARY     Status: Abnormal   Collection Time    01/09/13  9:42 PM      Result Value Range   Glucose-Capillary 136 (*) 70 - 99 mg/dL   Comment 1 Documented in Chart     Comment 2 Notify RN      Dg Tibia/fibula Left  01/08/2013   *RADIOLOGY REPORT*  Clinical Data: Wound infection  LEFT TIBIA AND FIBULA - 2 VIEW  Comparison: None.  Findings: Soft tissue changes are noted consistent with the patient's given clinical history.  Diffuse vascular calcifications are noted.  Some mild remodelling of the proximal tibia is seen likely related to prior injury and healing.  No acute fracture or the the patient is noted.  IMPRESSION: Soft tissue changes without acute abnormality noted.   Original Report Authenticated By: Inez Catalina, M.D.    Review of Systems  All other systems reviewed and are negative.   Blood pressure 129/74, pulse 53, temperature 99 F (37.2 C), temperature source Oral, resp. rate 24, height 5\' 6"  (1.676 m), weight 83 kg (182 lb 15.7 oz), SpO2 98.00%. Physical Exam On examination patient has a large necrotic wound over the anterior aspect the tibia. This is approximately 5 x 15 cm. There is no significant venous stasis insufficiency this appears to be more of a blunt trauma necrotic wound. Patient has a strong dorsalis pedis pulse. He does have diabetic insensate neuropathy. Assessment/Plan: Assessment: Necrotic wound left anterior tibia.  Plan. This wound is quite different it does not appear to be a venous stasis insufficiency origin. Will have patient continue with the Santyl dressing changes daily while in the hospital. Feel that after 5 days of IV antibiotics patient to be discharged to home with home health nursing providing Santyl dressing changes 3 times a week and by mouth antibiotics. I will  followup in the office in 2 weeks.  DUDA,MARCUS V 01/10/2013, 7:10 AM

## 2013-01-10 NOTE — Progress Notes (Signed)
CBG 53 MD aware, OJ given. Recheck CBG 83.

## 2013-01-10 NOTE — Progress Notes (Signed)
PATIENT DETAILS Name: Corey [name] Age: 67 y.o. Sex: male Date of Birth: 15-Oct-1945 Admit Date: 01/08/2013 Admitting Physician Rise Patience, MD [id number] Linna Darner, MD  Subjective: No major issues  Assessment/Plan: Left leg wound with cellulitis- with maggots  -patient was admitted and has been started on vancomycin and Zosyn- now on day 3 - Wound care and orthopedics were consulted- appreciate consult and currently on Santyl dressing 3 times a week - MRI of the left leg did not demonstrate osteomyelitis - Blood culture on 8/27-in negative so far .  Severe anemia  -Probably secondary to chronic kidney disease/chronic leg ulcer  -Received 1 unit PRBCs on 8/7  -Stool for occult blood was negative.  -Transfuse if less than 7  - Monitor H&H periodically  Diabetes mellitus type 2  -controlled. A1C 7.4 on 11/08/2012  -Continue present medications with sliding-scale coverage.   Hypertension  - BP controlled -Continue metoprolol, amlodipine, imdur, clonidine and hydralazine  History of CHF (presumed diastolic) -Presently looks compensated.  -On Metoprolol   Chronic kidney disease  -Creatinine at baseline.   History of cardiac arrest.  -Stable. No acute issues.   Disposition: Remain inpatient  DVT Prophylaxis: Prophylactic Lovenox   Code Status: Full code  Family Communication None at bedside  Procedures:  None  CONSULTS:  orthopedic surgery   MEDICATIONS: Scheduled Meds: . amLODipine  10 mg Oral Q12H  . cloNIDine  0.1 mg Oral Daily  . collagenase   Topical Daily  . enoxaparin (LOVENOX) injection  40 mg Subcutaneous Q24H  . hydrALAZINE  25 mg Oral TID  . insulin aspart  0-9 Units Subcutaneous TID WC  . insulin glargine  18 Units Subcutaneous QHS  . isosorbide mononitrate  60 mg Oral BID  . metoprolol succinate  100 mg Oral BID  . piperacillin-tazobactam (ZOSYN)  IV  3.375 g Intravenous Q8H  . sodium chloride  3 mL Intravenous Q12H  . sodium  chloride  3 mL Intravenous Q12H  . tamsulosin  0.4 mg Oral Daily  . vancomycin  1,500 mg Intravenous Q24H   Continuous Infusions:  PRN Meds:.acetaminophen, acetaminophen, albuterol, ipratropium, ondansetron (ZOFRAN) IV, ondansetron, oxyCODONE  Antibiotics: Anti-infectives   Start     Dose/Rate Route Frequency Ordered Stop   01/09/13 2000  vancomycin (VANCOCIN) IVPB 1000 mg/200 mL premix  Status:  Discontinued     1,000 mg 200 mL/hr over 60 Minutes Intravenous Every 24 hours 01/08/13 2007 01/08/13 2025   01/09/13 1800  vancomycin (VANCOCIN) 1,500 mg in sodium chloride 0.9 % 500 mL IVPB     1,500 mg 250 mL/hr over 120 Minutes Intravenous Every 24 hours 01/08/13 2025     01/09/13 0300  piperacillin-tazobactam (ZOSYN) IVPB 3.375 g     3.375 g 12.5 mL/hr over 240 Minutes Intravenous 3 times per day 01/08/13 2007     01/08/13 1945  piperacillin-tazobactam (ZOSYN) IVPB 3.375 g     3.375 g 100 mL/hr over 30 Minutes Intravenous  Once 01/08/13 1941 01/08/13 2100   01/08/13 1945  vancomycin (VANCOCIN) IVPB 1000 mg/200 mL premix     1,000 mg 200 mL/hr over 60 Minutes Intravenous  Once 01/08/13 1941 01/08/13 2201       PHYSICAL EXAM: Vital signs in last 24 hours: Filed Vitals:   01/09/13 1940 01/09/13 2143 01/10/13 0417 01/10/13 0954  BP: 147/73 126/72 129/74 125/70  Pulse: 65 64 53 56  Temp:  100.1 F (37.8 C) 99 F (37.2 C)   TempSrc:  Oral Oral  Resp:  18 24   Height:      Weight:      SpO2:  98% 98%     Weight change:  Filed Weights   01/08/13 2225  Weight: 83 kg (182 lb 15.7 oz)   Body mass index is 29.55 kg/(m^2).   Gen Exam: Awake and alert with clear speech.   Neck: Supple, No JVD.   Chest: B/L Clear.   CVS: S1 S2 Regular, no murmurs.  Abdomen: soft, BS +, non tender, non distended.  Extremities: no edema, lower extremities warm to touch. Neurologic: Non Focal.   Skin: No Rash.   Wounds: Necrotic wound left anterior tibia- had maggots on admission     Intake/Output from previous day:  Intake/Output Summary (Last 24 hours) at 01/10/13 1101 Last data filed at 01/10/13 0900  Gross per 24 hour  Intake    940 ml  Output    350 ml  Net    590 ml     LAB RESULTS: CBC  Recent Labs Lab 01/08/13 1910 01/08/13 2235 01/09/13 0510 01/10/13 0632  WBC 8.9 8.1 7.6 7.6  HGB 6.7* 6.7* 7.4* 7.7*  HCT 21.0* 21.1* 22.5* 23.4*  PLT 333 299 284 329  MCV 76.9* 76.4* 78.1 77.5*  MCH 24.5* 24.3* 25.7* 25.5*  MCHC 31.9 31.8 32.9 32.9  RDW 15.7* 15.7* 15.8* 15.8*  LYMPHSABS 1.7  --  1.6  --   MONOABS 0.8  --  0.6  --   EOSABS 0.1  --  0.1  --   BASOSABS 0.0  --  0.0  --     Chemistries   Recent Labs Lab 01/08/13 1910 01/08/13 2235 01/09/13 0510 01/10/13 0632  NA 137  --  140 140  K 4.2  --  3.8 3.6  CL 103  --  108 107  CO2 21  --  22 21  GLUCOSE 110*  --  85 59*  BUN 21  --  20 16  CREATININE 1.70* 1.72* 1.62* 1.64*  CALCIUM 10.3  --  9.8 10.0    CBG:  Recent Labs Lab 01/09/13 1252 01/09/13 1656 01/09/13 2142 01/10/13 0821 01/10/13 0854  GLUCAP 117* 168* 136* 53* 83    GFR Estimated Creatinine Clearance: 46 ml/min (by C-G formula based on Cr of 1.64).  Coagulation profile No results found for this basename: INR, PROTIME,  in the last 168 hours  Cardiac Enzymes No results found for this basename: CK, CKMB, TROPONINI, MYOGLOBIN,  in the last 168 hours  No components found with this basename: POCBNP,  No results found for this basename: DDIMER,  in the last 72 hours No results found for this basename: HGBA1C,  in the last 72 hours No results found for this basename: CHOL, HDL, LDLCALC, TRIG, CHOLHDL, LDLDIRECT,  in the last 72 hours No results found for this basename: TSH, T4TOTAL, FREET3, T3FREE, THYROIDAB,  in the last 72 hours No results found for this basename: VITAMINB12, FOLATE, FERRITIN, TIBC, IRON, RETICCTPCT,  in the last 72 hours No results found for this basename: LIPASE, AMYLASE,  in the last 72  hours  Urine Studies No results found for this basename: UACOL, UAPR, USPG, UPH, UTP, UGL, UKET, UBIL, UHGB, UNIT, UROB, ULEU, UEPI, UWBC, URBC, UBAC, CAST, CRYS, UCOM, BILUA,  in the last 72 hours  MICROBIOLOGY: Recent Results (from the past 240 hour(s))  CULTURE, BLOOD (ROUTINE X 2)     Status: None   Collection Time    01/08/13  7:10  PM      Result Value Range Status   Specimen Description BLOOD ARM RIGHT   Final   Special Requests BOTTLES DRAWN AEROBIC AND ANAEROBIC 10CC   Final   Culture  Setup Time     Final   Value: 01/09/2013 01:55     Performed at [facility]   Culture     Final   Value:        BLOOD CULTURE RECEIVED NO GROWTH TO DATE CULTURE WILL BE HELD FOR 5 DAYS BEFORE ISSUING A FINAL NEGATIVE REPORT     Performed at [facility]   Report Status PENDING   Incomplete  CULTURE, BLOOD (ROUTINE X 2)     Status: None   Collection Time    01/08/13  7:16 PM      Result Value Range Status   Specimen Description BLOOD ARM LEFT   Final   Special Requests BOTTLES DRAWN AEROBIC AND ANAEROBIC 10CC   Final   Culture  Setup Time     Final   Value: 01/09/2013 02:58     Performed at [facility]   Culture     Final   Value:        BLOOD CULTURE RECEIVED NO GROWTH TO DATE CULTURE WILL BE HELD FOR 5 DAYS BEFORE ISSUING A FINAL NEGATIVE REPORT     Performed at [facility]   Report Status PENDING   Incomplete    RADIOLOGY STUDIES/RESULTS: Dg Tibia/fibula Left  01/08/2013   *RADIOLOGY REPORT*  Clinical Data: Wound infection  LEFT TIBIA AND FIBULA - 2 VIEW  Comparison: None.  Findings: Soft tissue changes are noted consistent with the patient's given clinical history.  Diffuse vascular calcifications are noted.  Some mild remodelling of the proximal tibia is seen likely related to prior injury and healing.  No acute fracture or the the patient is noted.  IMPRESSION: Soft tissue changes without acute abnormality noted.   Original Report Authenticated By:  Inez Catalina, M.D.   Mr Tibia Fibula Left W Wo Contrast  01/10/2013   *RADIOLOGY REPORT*  Clinical Data:  Cellulitis and draining wound in the left lower leg.  Fever.  Chills.  MRI OF THE LEFT LOWER LEG WITH AND WITHOUT CONTRAST  Technique:  Multiplanar, multisequence MR imaging of the left lower leg was performed before and after the administration of intravenous contrast.  Contrast: 31mL MULTIHANCE GADOBENATE DIMEGLUMINE 529 MG/ML IV SOLN  Comparison:  Radiographs dated 01/08/2013  Findings: There is a superficial soft tissue wound anterior to the mid left tibia.  There is no underlying abscess.  There is edema and abnormal enhancement in the fascial planes of the mid lower leg in all 3 compartments but particularly around the proximal fibular shaft.  There is patchy edema in the medullary cavity of the proximal tibia shaft over a 13 cm length. This area also enhances.  I suspect this represents red marrow reactivation. There is a similar but much less prominent appearance in the right tibia.  There is no abscess or bone destruction or definitive osteomyelitis.  There is edema and enhancement of the subcutaneous tissues around the soft tissue wound.  IMPRESSION:  1.  Superficial cellulitis at the site of the wound. 2.  Focal myositis of the proximal lateral aspect of the calf around the proximal tibia. 3.  The perivascular areas enhance in the lower leg which may be related to infection. 4.  Edema and slight enhancement of the marrow cavity of the  proximal tibia, nonspecific.  I doubt this represents osteomyelitis and may represent asymmetric red marrow reactivation.  Hematogenous spread of infection to the marrow cavity is possible, however.   Original Report Authenticated By: Lorriane Shire, M.D.    Oren Binet, MD  [facility] Pager:[phone]  If 7PM-7AM, please contact night-coverage [email or web address] Password [email or web address] 01/10/2013, 11:01 AM   LOS: 2 days

## 2013-01-10 NOTE — Progress Notes (Signed)
Notified Lynch, NP that patient's CBG is 99 tonight. Patient to receive 18units of lantus tonight. Lynch ordered to give 10 units of lantus tonight instead of the ordered 18units. Will continue to monitor patient. Ranelle Oyster, RN

## 2013-01-10 NOTE — Consult Note (Signed)
WOC follow-up: Ortho service has consulted and agreed with current plan of care with [facility] for chemical debridement.  They plan to follow-up in the office as outpatient in 2 weeks. Refer to this team for further questions regarding plan of care. Please re-consult if further assistance is needed.  Thank-you,  Julien Girt MSN, [place], [place], [place], [place]

## 2013-01-11 LAB — GLUCOSE, CAPILLARY: Glucose-Capillary: 74 mg/dL (ref 70–99)

## 2013-01-11 LAB — VANCOMYCIN, TROUGH: Vancomycin Tr: 15.1 ug/mL (ref 10.0–20.0)

## 2013-01-11 MED ORDER — METOPROLOL SUCCINATE ER 100 MG PO TB24
100.0000 mg | ORAL_TABLET | Freq: Two times a day (BID) | ORAL | Status: DC
  Administered 2013-01-11 – 2013-01-12 (×2): 100 mg via ORAL
  Filled 2013-01-11 (×3): qty 1

## 2013-01-11 MED ORDER — CLONIDINE HCL 0.1 MG PO TABS
0.1000 mg | ORAL_TABLET | Freq: Every day | ORAL | Status: DC
  Administered 2013-01-12: 12:00:00 0.1 mg via ORAL
  Filled 2013-01-11: qty 1

## 2013-01-11 MED ORDER — FERROUS SULFATE 325 (65 FE) MG PO TABS
325.0000 mg | ORAL_TABLET | Freq: Three times a day (TID) | ORAL | Status: DC
  Administered 2013-01-11 – 2013-01-12 (×2): 325 mg via ORAL
  Filled 2013-01-11 (×5): qty 1

## 2013-01-11 MED ORDER — INSULIN GLARGINE 100 UNIT/ML ~~LOC~~ SOLN
10.0000 [IU] | Freq: Every day | SUBCUTANEOUS | Status: DC
  Administered 2013-01-11: 10 [IU] via SUBCUTANEOUS
  Filled 2013-01-11 (×2): qty 0.1

## 2013-01-11 NOTE — Progress Notes (Signed)
ANTIBIOTIC CONSULT NOTE - FOLLOW UP  Pharmacy Consult for vancomycin Indication: L leg wound  Allergies  Allergen Reactions  . Hydrochlorothiazide     Gout , uncontrolled diabetes and renal insufficiency  . Allopurinol     Renal insufficiency    Patient Measurements: Height: 5\' 6"  (167.6 cm) Weight: 182 lb 15.7 oz (83 kg) IBW/kg (Calculated) : 63.8  Vital Signs: Temp: 98.2 F (36.8 C) (08/30 1550) Temp src: Oral (08/30 1550) BP: 132/66 mmHg (08/30 1550) Pulse Rate: 65 (08/30 1550) Intake/Output from previous day: 08/29 0701 - 08/30 0700 In: 840 [P.O.:240; IV Piggyback:600] Out: -  Intake/Output from this shift:    Labs:  Recent Labs  01/08/13 2235 01/09/13 0510 01/10/13 0632  WBC 8.1 7.6 7.6  HGB 6.7* 7.4* 7.7*  PLT 299 284 329  CREATININE 1.72* 1.62* 1.64*   Estimated Creatinine Clearance: 46 ml/min (by C-G formula based on Cr of 1.64).  Recent Labs  01/11/13 1730  VANCOTROUGH 15.1     Microbiology: Recent Results (from the past 720 hour(s))  CULTURE, BLOOD (ROUTINE X 2)     Status: None   Collection Time    01/08/13  7:10 PM      Result Value Range Status   Specimen Description BLOOD ARM RIGHT   Final   Special Requests BOTTLES DRAWN AEROBIC AND ANAEROBIC 10CC   Final   Culture  Setup Time     Final   Value: 01/09/2013 01:55     Performed at [facility]   Culture     Final   Value:        BLOOD CULTURE RECEIVED NO GROWTH TO DATE CULTURE WILL BE HELD FOR 5 DAYS BEFORE ISSUING A FINAL NEGATIVE REPORT     Performed at [facility]   Report Status PENDING   Incomplete  CULTURE, BLOOD (ROUTINE X 2)     Status: None   Collection Time    01/08/13  7:16 PM      Result Value Range Status   Specimen Description BLOOD ARM LEFT   Final   Special Requests BOTTLES DRAWN AEROBIC AND ANAEROBIC 10CC   Final   Culture  Setup Time     Final   Value: 01/09/2013 02:58     Performed at [facility]   Culture     Final   Value:         BLOOD CULTURE RECEIVED NO GROWTH TO DATE CULTURE WILL BE HELD FOR 5 DAYS BEFORE ISSUING A FINAL NEGATIVE REPORT     Performed at [facility]   Report Status PENDING   Incomplete    Anti-infectives   Start     Dose/Rate Route Frequency Ordered Stop   01/09/13 2000  vancomycin (VANCOCIN) IVPB 1000 mg/200 mL premix  Status:  Discontinued     1,000 mg 200 mL/hr over 60 Minutes Intravenous Every 24 hours 01/08/13 2007 01/08/13 2025   01/09/13 1800  vancomycin (VANCOCIN) 1,500 mg in sodium chloride 0.9 % 500 mL IVPB     1,500 mg 250 mL/hr over 120 Minutes Intravenous Every 24 hours 01/08/13 2025     01/09/13 0300  piperacillin-tazobactam (ZOSYN) IVPB 3.375 g     3.375 g 12.5 mL/hr over 240 Minutes Intravenous 3 times per day 01/08/13 2007     01/08/13 1945  piperacillin-tazobactam (ZOSYN) IVPB 3.375 g     3.375 g 100 mL/hr over 30 Minutes Intravenous  Once 01/08/13 1941 01/08/13 2100   01/08/13 1945  vancomycin (VANCOCIN) IVPB 1000 mg/200 mL premix     1,000 mg 200 mL/hr over 60 Minutes Intravenous  Once 01/08/13 1941 01/08/13 2201      Assessment: 67 y/o male on day 4 vancomycin for L leg infection. Vancomycin trough is 15.1 and therapeutic. Renal function is stable. Cultures are negative thus far.  Goal of Therapy:  Vancomycin trough level 10-15 mcg/ml  Plan:  -Continue vancomycin 1500 mg IV q24h -F/u renal function, culture data, and clinical progress  [facility], Pharm.D., BCPS Clinical Pharmacist Pager: [phone] 01/11/2013 7:03 PM

## 2013-01-11 NOTE — Progress Notes (Signed)
PATIENT DETAILS Name: Corey [name] Age: 67 y.o. Sex: male Date of Birth: [birth year] Admit Date: 01/08/2013 Admitting Physician Rise Patience, MD [id number] Linna Darner, MD  Subjective: No major issues-wound care continues.  Assessment/Plan: Left leg wound with cellulitis- with maggots  -patient was admitted and has been started on vancomycin and Zosyn- now on day 4 - Wound care and orthopedics were consulted- appreciate consult and currently on Santyl dressing 3 times a week - MRI of the left leg did not demonstrate osteomyelitis - Blood culture on 8/27-in negative so far . Severe anemia  -Probably secondary to chronic kidney disease/chronic leg ulcer  -Received 1 unit PRBCs on 8/7  -Stool for occult blood was negative.  -Transfuse if less than 7  - Monitor H&H periodically  Diabetes mellitus type 2  -controlled. A1C 7.4 on 11/08/2012  -given borderline CBG's-decrease Lantus to 10 units   Hypertension  - BP controlled -Continue metoprolol, amlodipine, imdur, clonidine and hydralazine  History of CHF (presumed diastolic) -Presently looks compensated.  -On Metoprolol   Chronic kidney disease  -Creatinine at baseline.   History of cardiac arrest.  -Stable. No acute issues.   Disposition: Remain inpatient-home in am  DVT Prophylaxis: Prophylactic Lovenox   Code Status: Full code  Family Communication None at bedside  Procedures:  None  CONSULTS:  orthopedic surgery   MEDICATIONS: Scheduled Meds: . amLODipine  10 mg Oral Q12H  . cloNIDine  0.1 mg Oral Daily  . collagenase   Topical Daily  . enoxaparin (LOVENOX) injection  40 mg Subcutaneous Q24H  . hydrALAZINE  25 mg Oral TID  . insulin aspart  0-9 Units Subcutaneous TID WC  . insulin glargine  10 Units Subcutaneous QHS  . isosorbide mononitrate  60 mg Oral BID  . metoprolol succinate  100 mg Oral BID  . piperacillin-tazobactam (ZOSYN)  IV  3.375 g Intravenous Q8H  . sodium chloride  3 mL  Intravenous Q12H  . sodium chloride  3 mL Intravenous Q12H  . tamsulosin  0.4 mg Oral Daily  . vancomycin  1,500 mg Intravenous Q24H   Continuous Infusions:  PRN Meds:.acetaminophen, acetaminophen, albuterol, ipratropium, ondansetron (ZOFRAN) IV, ondansetron, oxyCODONE  Antibiotics: Anti-infectives   Start     Dose/Rate Route Frequency Ordered Stop   01/09/13 2000  vancomycin (VANCOCIN) IVPB 1000 mg/200 mL premix  Status:  Discontinued     1,000 mg 200 mL/hr over 60 Minutes Intravenous Every 24 hours 01/08/13 2007 01/08/13 2025   01/09/13 1800  vancomycin (VANCOCIN) 1,500 mg in sodium chloride 0.9 % 500 mL IVPB     1,500 mg 250 mL/hr over 120 Minutes Intravenous Every 24 hours 01/08/13 2025     01/09/13 0300  piperacillin-tazobactam (ZOSYN) IVPB 3.375 g     3.375 g 12.5 mL/hr over 240 Minutes Intravenous 3 times per day 01/08/13 2007     01/08/13 1945  piperacillin-tazobactam (ZOSYN) IVPB 3.375 g     3.375 g 100 mL/hr over 30 Minutes Intravenous  Once 01/08/13 1941 01/08/13 2100   01/08/13 1945  vancomycin (VANCOCIN) IVPB 1000 mg/200 mL premix     1,000 mg 200 mL/hr over 60 Minutes Intravenous  Once 01/08/13 1941 01/08/13 2201       PHYSICAL EXAM: Vital signs in last 24 hours: Filed Vitals:   01/10/13 1337 01/10/13 1956 01/10/13 2204 01/11/13 0439  BP: 124/68 129/66 133/65 138/69  Pulse: 70 73 70 72  Temp: 99.2 F (37.3 C)  99.4 F (37.4 C) 98.8 F (37.1 C)  TempSrc: Oral  Oral Oral  Resp: 18  24 24   Height:      Weight:      SpO2: 97%  96% 98%    Weight change:  Filed Weights   01/08/13 2225  Weight: 83 kg (182 lb 15.7 oz)   Body mass index is 29.55 kg/(m^2).   Gen Exam: Awake and alert with clear speech.   Neck: Supple, No JVD.   Chest: B/L Clear.   CVS: S1 S2 Regular, no murmurs.  Abdomen: soft, BS +, non tender, non distended.  Extremities: no edema, lower extremities warm to touch. Neurologic: Non Focal.   Skin: No Rash.   Wounds: Necrotic wound left  anterior tibia- had maggots on admission    Intake/Output from previous day:  Intake/Output Summary (Last 24 hours) at 01/11/13 1029 Last data filed at 01/11/13 0524  Gross per 24 hour  Intake    600 ml  Output      0 ml  Net    600 ml     LAB RESULTS: CBC  Recent Labs Lab 01/08/13 1910 01/08/13 2235 01/09/13 0510 01/10/13 0632  WBC 8.9 8.1 7.6 7.6  HGB 6.7* 6.7* 7.4* 7.7*  HCT 21.0* 21.1* 22.5* 23.4*  PLT 333 299 284 329  MCV 76.9* 76.4* 78.1 77.5*  MCH 24.5* 24.3* 25.7* 25.5*  MCHC 31.9 31.8 32.9 32.9  RDW 15.7* 15.7* 15.8* 15.8*  LYMPHSABS 1.7  --  1.6  --   MONOABS 0.8  --  0.6  --   EOSABS 0.1  --  0.1  --   BASOSABS 0.0  --  0.0  --     Chemistries   Recent Labs Lab 01/08/13 1910 01/08/13 2235 01/09/13 0510 01/10/13 0632  NA 137  --  140 140  K 4.2  --  3.8 3.6  CL 103  --  108 107  CO2 21  --  22 21  GLUCOSE 110*  --  85 59*  BUN 21  --  20 16  CREATININE 1.70* 1.72* 1.62* 1.64*  CALCIUM 10.3  --  9.8 10.0    CBG:  Recent Labs Lab 01/10/13 0854 01/10/13 1133 01/10/13 1652 01/10/13 2204 01/11/13 0800  GLUCAP 83 109* 125* 99 74    GFR Estimated Creatinine Clearance: 46 ml/min (by C-G formula based on Cr of 1.64).  Coagulation profile No results found for this basename: INR, PROTIME,  in the last 168 hours  Cardiac Enzymes No results found for this basename: CK, CKMB, TROPONINI, MYOGLOBIN,  in the last 168 hours  No components found with this basename: POCBNP,  No results found for this basename: DDIMER,  in the last 72 hours No results found for this basename: HGBA1C,  in the last 72 hours No results found for this basename: CHOL, HDL, LDLCALC, TRIG, CHOLHDL, LDLDIRECT,  in the last 72 hours No results found for this basename: TSH, T4TOTAL, FREET3, T3FREE, THYROIDAB,  in the last 72 hours No results found for this basename: VITAMINB12, FOLATE, FERRITIN, TIBC, IRON, RETICCTPCT,  in the last 72 hours No results found for this  basename: LIPASE, AMYLASE,  in the last 72 hours  Urine Studies No results found for this basename: UACOL, UAPR, USPG, UPH, UTP, UGL, UKET, UBIL, UHGB, UNIT, UROB, ULEU, UEPI, UWBC, URBC, UBAC, CAST, CRYS, UCOM, BILUA,  in the last 72 hours  MICROBIOLOGY: Recent Results (from the past 240 hour(s))  CULTURE, BLOOD (ROUTINE X 2)     Status: None  Collection Time    01/08/13  7:10 PM      Result Value Range Status   Specimen Description BLOOD ARM RIGHT   Final   Special Requests BOTTLES DRAWN AEROBIC AND ANAEROBIC 10CC   Final   Culture  Setup Time     Final   Value: 01/09/2013 01:55     Performed at [facility]   Culture     Final   Value:        BLOOD CULTURE RECEIVED NO GROWTH TO DATE CULTURE WILL BE HELD FOR 5 DAYS BEFORE ISSUING A FINAL NEGATIVE REPORT     Performed at [facility]   Report Status PENDING   Incomplete  CULTURE, BLOOD (ROUTINE X 2)     Status: None   Collection Time    01/08/13  7:16 PM      Result Value Range Status   Specimen Description BLOOD ARM LEFT   Final   Special Requests BOTTLES DRAWN AEROBIC AND ANAEROBIC 10CC   Final   Culture  Setup Time     Final   Value: 01/09/2013 02:58     Performed at [facility]   Culture     Final   Value:        BLOOD CULTURE RECEIVED NO GROWTH TO DATE CULTURE WILL BE HELD FOR 5 DAYS BEFORE ISSUING A FINAL NEGATIVE REPORT     Performed at [facility]   Report Status PENDING   Incomplete    RADIOLOGY STUDIES/RESULTS: Dg Tibia/fibula Left  01/08/2013   *RADIOLOGY REPORT*  Clinical Data: Wound infection  LEFT TIBIA AND FIBULA - 2 VIEW  Comparison: None.  Findings: Soft tissue changes are noted consistent with the patient's given clinical history.  Diffuse vascular calcifications are noted.  Some mild remodelling of the proximal tibia is seen likely related to prior injury and healing.  No acute fracture or the the patient is noted.  IMPRESSION: Soft tissue changes without acute abnormality  noted.   Original Report Authenticated By: Inez Catalina, M.D.   Mr Tibia Fibula Left W Wo Contrast  01/10/2013   *RADIOLOGY REPORT*  Clinical Data:  Cellulitis and draining wound in the left lower leg.  Fever.  Chills.  MRI OF THE LEFT LOWER LEG WITH AND WITHOUT CONTRAST  Technique:  Multiplanar, multisequence MR imaging of the left lower leg was performed before and after the administration of intravenous contrast.  Contrast: 88mL MULTIHANCE GADOBENATE DIMEGLUMINE 529 MG/ML IV SOLN  Comparison:  Radiographs dated 01/08/2013  Findings: There is a superficial soft tissue wound anterior to the mid left tibia.  There is no underlying abscess.  There is edema and abnormal enhancement in the fascial planes of the mid lower leg in all 3 compartments but particularly around the proximal fibular shaft.  There is patchy edema in the medullary cavity of the proximal tibia shaft over a 13 cm length. This area also enhances.  I suspect this represents red marrow reactivation. There is a similar but much less prominent appearance in the right tibia.  There is no abscess or bone destruction or definitive osteomyelitis.  There is edema and enhancement of the subcutaneous tissues around the soft tissue wound.  IMPRESSION:  1.  Superficial cellulitis at the site of the wound. 2.  Focal myositis of the proximal lateral aspect of the calf around the proximal tibia. 3.  The perivascular areas enhance in the lower leg which may be related to infection. 4.  Edema and  slight enhancement of the marrow cavity of the proximal tibia, nonspecific.  I doubt this represents osteomyelitis and may represent asymmetric red marrow reactivation.  Hematogenous spread of infection to the marrow cavity is possible, however.   Original Report Authenticated By: Lorriane Shire, M.D.    Oren Binet, MD  [facility] Pager:[phone]  If 7PM-7AM, please contact night-coverage [email or web address] Password [email or web address] 01/11/2013, 10:29 AM    LOS: 3 days

## 2013-01-11 NOTE — Progress Notes (Signed)
Pt ambulated in hallway around nursing station twice.  Denied any complaints while ambulating.  Pt tolerated well.

## 2013-01-12 LAB — TYPE AND SCREEN
Antibody Screen: NEGATIVE
Unit division: 0

## 2013-01-12 LAB — GLUCOSE, CAPILLARY: Glucose-Capillary: 87 mg/dL (ref 70–99)

## 2013-01-12 MED ORDER — CIPROFLOXACIN HCL 500 MG PO TABS: 500.0000 mg | ORAL_TABLET | Freq: Two times a day (BID) | ORAL | Status: DC

## 2013-01-12 MED ORDER — FERROUS SULFATE 325 (65 FE) MG PO TABS: 325.0000 mg | ORAL_TABLET | Freq: Three times a day (TID) | ORAL | Status: DC

## 2013-01-12 MED ORDER — OXYCODONE HCL 5 MG PO TABS: 5.0000 mg | ORAL_TABLET | Freq: Four times a day (QID) | ORAL | Status: DC | PRN

## 2013-01-12 MED ORDER — DOXYCYCLINE HYCLATE 50 MG PO CAPS: 100.0000 mg | ORAL_CAPSULE | Freq: Two times a day (BID) | ORAL | Status: DC

## 2013-01-12 MED ORDER — COLLAGENASE 250 UNIT/GM EX OINT: TOPICAL_OINTMENT | Freq: Every day | CUTANEOUS | Status: DC

## 2013-01-12 MED ORDER — INSULIN GLARGINE 100 UNIT/ML ~~LOC~~ SOLN: 10.0000 [IU] | Freq: Every day | SUBCUTANEOUS | Status: DC

## 2013-01-12 NOTE — Progress Notes (Signed)
Dilaudid given once for pain. Dressing with small amount of dried drainage noted. Patient slept through most of night.

## 2013-01-12 NOTE — Progress Notes (Signed)
   CARE MANAGEMENT NOTE 01/12/2013  Patient:  Corey [name], Corey [name]   Account Number:  [id number]  Date Initiated:  01/09/2013  Documentation initiated by:  Tomi Bamberger  Subjective/Objective Assessment:   dx left leg wound cellulitis, anemia  admit- lives with son     Action/Plan:   HHRN for dressing changes   Anticipated DC Date:  01/10/2013   Anticipated DC Plan:  [place]  CM consult      [facility] Choice  HOME HEALTH   Choice offered to / List presented to:  C-1 Patient        [place] arranged  HH-1 RN      [place].   Status of service:  Completed, signed off Medicare Important Message given?   (If response is "NO", the following Medicare IM given date fields will be blank) Date Medicare IM given:   Date Additional Medicare IM given:    Discharge Disposition:  [place]  Per UR Regulation:  Reviewed for med. necessity/level of care/duration of stay  If discussed at [place] of Stay Meetings, dates discussed:    Comments:  01/12/13 12:55 CM spoke with pt in room and pt states he does not need any equipment.  Orders faxed to [facility].  Mariane Masters, BSN, [place] [phone].   01/10/13 10:02 Tomi Bamberger RN, BSN [phone] patient lives with son, patient will need [facility] for dressing changes, patient chose [facility] from agency list, referral made to [facility], [place] notified.  Soc will begin 24-48 hrs post discharge.  Patient will probably be dc on Sunday.  01/09/13 15:47 Tomi Bamberger RN, BSN [phone] patient lives with son, patient will need [facility] for wound dressing changes, will offer choices to patient.

## 2013-01-12 NOTE — Progress Notes (Signed)
Pt given discharge instructions and prescriptions.  PIV removed.  Showed family and patient how to do dressing change.  Family and patient able to recall steps and verbally expressed understanding.  Denied any other questions.  Pt taken to discharge location via wheel chair.

## 2013-01-12 NOTE — Discharge Summary (Signed)
PATIENT DETAILS Name: Corey [name] Age: 67 y.o. Sex: male Date of Birth: [birth year] MRN: [id number]. Admit Date: 01/08/2013 Admitting Physician: Rise Patience, MD [id number] Linna Darner, MD  Recommendations for Outpatient Follow-up:  1. Patient has been referred to Dr. Sharol Given for further continued wound care 2. Home health RN for wound care as well 3. Please check hemoglobin and hematocrit at next visit-required PRBC transfusion this admission 4. Lantus has been decreased to 10 units, patient persistently hypoglycemic with 18 units. 5. Has history of chronic kidney disease, please check creatinine periodically  PRIMARY DISCHARGE DIAGNOSIS:  Principal Problem:   Leg wound, left Active Problems:   HYPERTENSION, ESSENTIAL NOS   Type II or unspecified type diabetes mellitus with renal manifestations, uncontrolled(250.42)   Cellulitis   Anemia      PAST MEDICAL HISTORY: Past Medical History  Diagnosis Date  . Renal insufficiency   . Diabetes mellitus   . Hypertension   . Gout   . Colon polyp 2003    Dr [name]; F/U was to be 2008( not completed)  . Diverticulosis 2003  . Pneumonia 09/29/11    Avelox X 10 days as OP    DISCHARGE MEDICATIONS:   Medication List         amLODipine 10 MG tablet  Commonly known as:  NORVASC  Take 1 tablet (10 mg total) by mouth daily.     ciprofloxacin 500 MG tablet  Commonly known as:  CIPRO  Take 1 tablet (500 mg total) by mouth 2 (two) times daily.     cloNIDine 0.1 MG tablet  Commonly known as:  CATAPRES  Take 1 tablet (0.1 mg total) by mouth daily.     collagenase ointment  Commonly known as:  SANTYL  - Apply topically daily. Topical, Daily  - Apply Santyl to left leg Q day.  Cover with moist gauze and kerlex     doxycycline 50 MG capsule  Commonly known as:  VIBRAMYCIN  Take 2 capsules (100 mg total) by mouth 2 (two) times daily.     ferrous sulfate 325 (65 FE) MG tablet  Take 1 tablet (325 mg total) by mouth 3 (three)  times daily with meals.     hydrALAZINE 25 MG tablet  Commonly known as:  APRESOLINE  Take 25 mg by mouth. 3 BY MOUTH THREE TIMES DAILY     insulin glargine 100 UNIT/ML injection  Commonly known as:  LANTUS  Inject 0.1 mLs (10 Units total) into the skin daily.     ipratropium-albuterol 0.5-2.5 (3) MG/3ML Soln  Commonly known as:  DUONEB  Take 3 mLs by nebulization 3 (three) times daily as needed.     isosorbide mononitrate 60 MG 24 hr tablet  Commonly known as:  IMDUR  Take 1 tablet (60 mg total) by mouth 2 (two) times daily.     metoprolol succinate 100 MG 24 hr tablet  Commonly known as:  TOPROL-XL  Take 1 tablet (100 mg total) by mouth 2 (two) times daily. Take with or immediately following a meal.     oxyCODONE 5 MG immediate release tablet  Commonly known as:  Oxy IR/ROXICODONE  Take 1 tablet (5 mg total) by mouth every 6 (six) hours as needed for pain.     tamsulosin 0.4 MG Caps capsule  Commonly known as:  FLOMAX  Take 1 capsule (0.4 mg total) by mouth daily.        ALLERGIES:   Allergies  Allergen Reactions  . Hydrochlorothiazide  Gout , uncontrolled diabetes and renal insufficiency  . Allopurinol     Renal insufficiency    BRIEF HPI:  See H&P, Labs, Consult and Test reports for all details in brief, patient is a 67 year old male with past medical history of PEA arrest, PEG tube feeding which was just removed 3 weeks ago, admitted for worsening left leg wound with magg infestation. Patient was then admitted for further evaluation and treatment   CONSULTATIONS:   orthopedic surgery  PERTINENT RADIOLOGIC STUDIES: Dg Tibia/fibula Left  01/08/2013   *RADIOLOGY REPORT*  Clinical Data: Wound infection  LEFT TIBIA AND FIBULA - 2 VIEW  Comparison: None.  Findings: Soft tissue changes are noted consistent with the patient's given clinical history.  Diffuse vascular calcifications are noted.  Some mild remodelling of the proximal tibia is seen likely related to  prior injury and healing.  No acute fracture or the the patient is noted.  IMPRESSION: Soft tissue changes without acute abnormality noted.   Original Report Authenticated By: Inez Catalina, M.D.   Mr Tibia Fibula Left W Wo Contrast  01/10/2013   *RADIOLOGY REPORT*  Clinical Data:  Cellulitis and draining wound in the left lower leg.  Fever.  Chills.  MRI OF THE LEFT LOWER LEG WITH AND WITHOUT CONTRAST  Technique:  Multiplanar, multisequence MR imaging of the left lower leg was performed before and after the administration of intravenous contrast.  Contrast: 75mL MULTIHANCE GADOBENATE DIMEGLUMINE 529 MG/ML IV SOLN  Comparison:  Radiographs dated 01/08/2013  Findings: There is a superficial soft tissue wound anterior to the mid left tibia.  There is no underlying abscess.  There is edema and abnormal enhancement in the fascial planes of the mid lower leg in all 3 compartments but particularly around the proximal fibular shaft.  There is patchy edema in the medullary cavity of the proximal tibia shaft over a 13 cm length. This area also enhances.  I suspect this represents red marrow reactivation. There is a similar but much less prominent appearance in the right tibia.  There is no abscess or bone destruction or definitive osteomyelitis.  There is edema and enhancement of the subcutaneous tissues around the soft tissue wound.  IMPRESSION:  1.  Superficial cellulitis at the site of the wound. 2.  Focal myositis of the proximal lateral aspect of the calf around the proximal tibia. 3.  The perivascular areas enhance in the lower leg which may be related to infection. 4.  Edema and slight enhancement of the marrow cavity of the proximal tibia, nonspecific.  I doubt this represents osteomyelitis and may represent asymmetric red marrow reactivation.  Hematogenous spread of infection to the marrow cavity is possible, however.   Original Report Authenticated By: Lorriane Shire, M.D.     PERTINENT LAB  RESULTS: CBC:  Recent Labs  01/10/13 0632  WBC 7.6  HGB 7.7*  HCT 23.4*  PLT 329   CMET CMP     Component Value Date/Time   NA 140 01/10/2013 0632   K 3.6 01/10/2013 0632   CL 107 01/10/2013 0632   CO2 21 01/10/2013 0632   GLUCOSE 59* 01/10/2013 0632   BUN 16 01/10/2013 0632   CREATININE 1.64* 01/10/2013 0632   CALCIUM 10.0 01/10/2013 0632   PROT 6.9 01/09/2013 0510   ALBUMIN 2.5* 01/09/2013 0510   AST 46* 01/09/2013 0510   ALT 35 01/09/2013 0510   ALKPHOS 61 01/09/2013 0510   BILITOT 0.7 01/09/2013 0510   GFRNONAA 43* 01/10/2013 [id number]   GFRAA  49* 01/10/2013 0632    GFR Estimated Creatinine Clearance: 46 ml/min (by C-G formula based on Cr of 1.64). No results found for this basename: LIPASE, AMYLASE,  in the last 72 hours No results found for this basename: CKTOTAL, CKMB, CKMBINDEX, TROPONINI,  in the last 72 hours No components found with this basename: POCBNP,  No results found for this basename: DDIMER,  in the last 72 hours No results found for this basename: HGBA1C,  in the last 72 hours No results found for this basename: CHOL, HDL, LDLCALC, TRIG, CHOLHDL, LDLDIRECT,  in the last 72 hours No results found for this basename: TSH, T4TOTAL, FREET3, T3FREE, THYROIDAB,  in the last 72 hours No results found for this basename: VITAMINB12, FOLATE, FERRITIN, TIBC, IRON, RETICCTPCT,  in the last 72 hours Coags: No results found for this basename: PT, INR,  in the last 72 hours Microbiology: Recent Results (from the past 240 hour(s))  CULTURE, BLOOD (ROUTINE X 2)     Status: None   Collection Time    01/08/13  7:10 PM      Result Value Range Status   Specimen Description BLOOD ARM RIGHT   Final   Special Requests BOTTLES DRAWN AEROBIC AND ANAEROBIC 10CC   Final   Culture  Setup Time     Final   Value: 01/09/2013 01:55     Performed at [facility]   Culture     Final   Value:        BLOOD CULTURE RECEIVED NO GROWTH TO DATE CULTURE WILL BE HELD FOR 5 DAYS BEFORE ISSUING A  FINAL NEGATIVE REPORT     Performed at [facility]   Report Status PENDING   Incomplete  CULTURE, BLOOD (ROUTINE X 2)     Status: None   Collection Time    01/08/13  7:16 PM      Result Value Range Status   Specimen Description BLOOD ARM LEFT   Final   Special Requests BOTTLES DRAWN AEROBIC AND ANAEROBIC 10CC   Final   Culture  Setup Time     Final   Value: 01/09/2013 02:58     Performed at [facility]   Culture     Final   Value:        BLOOD CULTURE RECEIVED NO GROWTH TO DATE CULTURE WILL BE HELD FOR 5 DAYS BEFORE ISSUING A FINAL NEGATIVE REPORT     Performed at [facility]   Report Status PENDING   Incomplete     [place]:   Principal Problem: Left leg wound with cellulitis- with maggots  - Patient was seen and evaluated in the emergency room, maggots were seen, patient was started on empiric vancomycin and Zosyn. Blood cultures were obtained these were negative at the time of discharge. On care and orthopedics were consulted. Current recommendations are to continue with Santyl dressing changes, and to followup with Dr. Sharol Given as an outpatient. MRI of the leg showed superficial cellulitis at the site of the wound, with focal myositis, there was no osteomyelitis. Current recommendations are to continue with antibiotics, hence we will transition to doxycycline and ciprofloxacin on discharge, have home health RN for the wound along with Dr. Sharol Given. Patient has been asked to call and make an appointment with Dr. Jess Barters office within the next 10 days.  Active Problems: Severe anemia  -Probably secondary to chronic kidney disease/chronic leg ulcer  -Received 1 unit PRBCs on 8/7  -Stool for occult blood  was negative.  - Patient has been started on iron supplementation, he will need close monitoring of hemoglobin and hematocrit in the outpatient setting.  Diabetes mellitus type 2  -controlled. A1C 7.4 on 11/08/2012  -given borderline CBG's, with some  episodes of frank hypoglycemia -decrease Lantus to 10 units , please follow closely in the outpatient setting  History of CHF (presumed diastolic)  -Presently looks compensated.  -On Metoprolol   Chronic kidney disease  -Creatinine at baseline.  - Please monitor electrolytes in the outpatient setting   TODAY-DAY OF DISCHARGE:  Subjective:   Barnet Pall today has no headache,no chest abdominal pain,no new weakness tingling or numbness, feels much better wants to go home today.   Objective:   Blood pressure 126/59, pulse 61, temperature 99.2 F (37.3 C), temperature source Oral, resp. rate 20, height 5\' 6"  (1.676 m), weight 83 kg (182 lb 15.7 oz), SpO2 99.00%.  Intake/Output Summary (Last 24 hours) at 01/12/13 0858 Last data filed at 01/12/13 0849  Gross per 24 hour  Intake   1644 ml  Output    500 ml  Net   1144 ml   Filed Weights   01/08/13 2225  Weight: 83 kg (182 lb 15.7 oz)    Exam Awake Alert, Oriented *3, No new F.N deficits, Normal affect [place].AT,PERRAL Supple Neck,No JVD, No cervical lymphadenopathy appriciated.  Symmetrical Chest wall movement, Good air movement bilaterally, CTAB RRR,No Gallops,Rubs or new Murmurs, No Parasternal Heave +ve B.Sounds, Abd Soft, Non tender, No organomegaly appriciated, No rebound -guarding or rigidity. No Cyanosis, Clubbing or edema, No new Rash or bruise  DISCHARGE CONDITION: Stable  DISPOSITION: Home with home health services  DISCHARGE INSTRUCTIONS:    Activity:  As tolerated   Diet recommendation: Diabetic Diet Heart Healthy diet      Discharge Orders   Future Appointments Provider Department Dept Phone   01/20/2013 3:45 PM [facility] [id number]   01/23/2013 3:45 PM Elayne Snare, MD [facility] [phone]   Future Orders Complete By Expires   Call MD for:  redness, tenderness, or signs of infection (pain, swelling, redness, odor or green/yellow discharge  around incision site)  As directed    Call MD for:  temperature >100.4  As directed    Diet - low sodium heart healthy  As directed    Discharge wound care:  As directed    Comments:     SANTYL DRESSING DAILY   Increase activity slowly  As directed       Follow-up Information   Follow up with DUDA,MARCUS V, MD In 10 days.   Specialty:  Orthopedic Surgery   Contact information:   [place] [address] [phone]       Follow up with Unice Cobble, MD. Schedule an appointment as soon as possible for a visit in 1 week.   Specialty:  Internal Medicine   Contact information:   [phone] [address] [facility] [address] [place] [address] [phone]      Total Time spent on discharge equals 45 minutes.  SignedOren Binet 01/12/2013 8:58 AM

## 2013-01-15 LAB — CULTURE, BLOOD (ROUTINE X 2): Culture: NO GROWTH

## 2013-01-17 ENCOUNTER — Encounter (HOSPITAL_BASED_OUTPATIENT_CLINIC_OR_DEPARTMENT_OTHER): Payer: No Typology Code available for payment source

## 2013-01-18 ENCOUNTER — Other Ambulatory Visit: Payer: Self-pay | Admitting: Internal Medicine

## 2013-01-20 ENCOUNTER — Other Ambulatory Visit (INDEPENDENT_AMBULATORY_CARE_PROVIDER_SITE_OTHER): Payer: No Typology Code available for payment source

## 2013-01-20 DIAGNOSIS — IMO0001 Reserved for inherently not codable concepts without codable children: Secondary | ICD-10-CM

## 2013-01-20 LAB — URINALYSIS, ROUTINE W REFLEX MICROSCOPIC
Bilirubin Urine: NEGATIVE
Ketones, ur: NEGATIVE
Leukocytes, UA: NEGATIVE
Specific Gravity, Urine: >=1.03 (ref 1.000–1.030)
Urine Glucose: NEGATIVE
pH: 5.5 (ref 5.0–8.0)

## 2013-01-20 LAB — BASIC METABOLIC PANEL
BUN: 17 mg/dL (ref 6–23)
Calcium: 9.9 mg/dL (ref 8.4–10.5)
GFR: 60.45 mL/min (ref >60.00)
Potassium: 3.7 mEq/L (ref 3.5–5.1)
Sodium: 142 mEq/L (ref 135–145)

## 2013-01-21 LAB — MICROALBUMIN / CREATININE URINE RATIO: Microalb Creat Ratio: 1.1 mg/g (ref 0.0–30.0)

## 2013-01-21 LAB — FRUCTOSAMINE: Fructosamine: 284 umol/L (ref <285)

## 2013-01-23 ENCOUNTER — Encounter: Payer: Self-pay | Admitting: Endocrinology

## 2013-01-23 ENCOUNTER — Ambulatory Visit (INDEPENDENT_AMBULATORY_CARE_PROVIDER_SITE_OTHER): Payer: No Typology Code available for payment source | Admitting: Endocrinology

## 2013-01-23 VITALS — BP 140/64 | HR 53 | Temp 98.7°F | Ht 66.0 in | Wt 187.5 lb

## 2013-01-23 DIAGNOSIS — E1129 Type 2 diabetes mellitus with other diabetic kidney complication: Secondary | ICD-10-CM

## 2013-01-23 NOTE — Progress Notes (Signed)
Patient ID: Corey [name], male   DOB: 1945/06/28, 67 y.o.   MRN: [id number]    Reason for Appointment : Followup of Type 2 Diabetes  History of Present Illness          Diagnosis: Type 2 diabetes mellitus, date of diagnosis: 1997      Past history: The patient was initially treated with various oral hypoglycemic drugs and details are not available. He has generally been followed by his primary care physician About 5 years ago he was started on insulin and most recently had been taking premixed insulin in the morning once a day   Recent history:  Recently he has been taking only Lantus insulin, 10units at night On his last visit he was told to switch to Humalog mix insulin to provide better mealtime coverage but was switched back on hospital discharge    Glucose monitoring:  done one time a day         Glucometer: unknown      Blood Glucose readings from  recall:  mostly 110 -120 but not checking after meals at all      Hypoglycemia frequency: none recently.           Self-care: The diet that the patient has been following [id number] to limit carbohydrates.      Meals: 3 meals per day. Cereal in am, sometimes sandwich at lunch His largest meal is at suppertime           Physical activity: exercise: Walking 1/4 mile; 2-3 times a week.          Dietician visit: Most recent:never                Oral hypoglycemic drugs the patient is taking are:  none        INSULIN regimen is described as:  10 Lantus at bedtime Compliance with the medical regimen:  good  Retinal exam: Most recent: about a year ago .    Microalbumin: Lab Results  Component Value Date   MICROALBUR 2.4* 01/20/2013    Lab Results  Component Value Date   HGBA1C 7.4* 11/08/2012    Filed Weights   01/23/13 1538  Weight: 187 lb 8 oz (85.049 kg)      Medication List       This list is accurate as of: 01/23/13  3:48 PM.  Always use your most recent med list.               amLODipine 10 MG tablet  Commonly known  as:  NORVASC  Take 1 tablet (10 mg total) by mouth daily.     ciprofloxacin 500 MG tablet  Commonly known as:  CIPRO  Take 1 tablet (500 mg total) by mouth 2 (two) times daily.     cloNIDine 0.1 MG tablet  Commonly known as:  CATAPRES  Take 1 tablet (0.1 mg total) by mouth daily.     collagenase ointment  Commonly known as:  SANTYL  - Apply topically daily. Topical, Daily  - Apply Santyl to left leg Q day.  Cover with moist gauze and kerlex     doxycycline 50 MG capsule  Commonly known as:  VIBRAMYCIN  Take 2 capsules (100 mg total) by mouth 2 (two) times daily.     ferrous sulfate 325 (65 FE) MG tablet  Take 1 tablet (325 mg total) by mouth 3 (three) times daily with meals.     hydrALAZINE 25 MG tablet  Commonly known as:  APRESOLINE  Take 25 mg by mouth. 3 BY MOUTH THREE TIMES DAILY     insulin glargine 100 UNIT/ML injection  Commonly known as:  LANTUS  Inject 0.1 mLs (10 Units total) into the skin daily.     ipratropium-albuterol 0.5-2.5 (3) MG/3ML Soln  Commonly known as:  DUONEB  Take 3 mLs by nebulization 3 (three) times daily as needed.     isosorbide mononitrate 60 MG 24 hr tablet  Commonly known as:  IMDUR  Take 1 tablet (60 mg total) by mouth 2 (two) times daily.     metoprolol succinate 100 MG 24 hr tablet  Commonly known as:  TOPROL-XL  Take 1 tablet (100 mg total) by mouth 2 (two) times daily. Take with or immediately following a meal.     oxyCODONE 5 MG immediate release tablet  Commonly known as:  Oxy IR/ROXICODONE  Take 1 tablet (5 mg total) by mouth every 6 (six) hours as needed for pain.     tamsulosin 0.4 MG Caps capsule  Commonly known as:  FLOMAX  Take 1 capsule (0.4 mg total) by mouth daily.        Allergies:  Allergies  Allergen Reactions  . Hydrochlorothiazide     Gout , uncontrolled diabetes and renal insufficiency  . Allopurinol     Renal insufficiency    Past Medical History  Diagnosis Date  . Renal insufficiency   .  Diabetes mellitus   . Hypertension   . Gout   . Colon polyp 2003    Dr [name]; F/U was to be 2008( not completed)  . Diverticulosis 2003  . Pneumonia 09/29/11    Avelox X 10 days as OP    Past Surgical History  Procedure Laterality Date  . Refractive surgery  2005  . Colonoscopy w/ polypectomy  2003    no F/U ([place] discussed 12/03/12)  . Wrist aspiration  02/16/12     monosodium urate crystals; Dr Caralyn Guile    Family History  Problem Relation Age of Onset  . Hypertension Father   . Prostate cancer Father   . Diabetes Father   . Hypertension Mother   . Diabetes Mother   . Stroke Neg Hx   . Heart disease Neg Hx     Social History:  reports that he quit smoking about 44 years ago. He has never used smokeless tobacco. He reports that he does not drink alcohol or use illicit drugs.    Review of Systems       Lipids: Only abnormality is low HDL, currently not on a statin drug  Eye exam 1-2 years, ? Retinopathy    He has renal insufficiency of unclear etiology, 1.5  LABS:  Appointment on 01/20/2013  Component Date Value Range Status  . Sodium 01/20/2013 142  135 - 145 mEq/L Final  . Potassium 01/20/2013 3.7  3.5 - 5.1 mEq/L Final  . Chloride 01/20/2013 110  96 - 112 mEq/L Final  . CO2 01/20/2013 26  19 - 32 mEq/L Final  . Glucose, Bld 01/20/2013 128* 70 - 99 mg/dL Final  . BUN 01/20/2013 17  6 - 23 mg/dL Final  . Creatinine, Ser 01/20/2013 1.5  0.4 - 1.5 mg/dL Final  . Calcium 01/20/2013 9.9  8.4 - 10.5 mg/dL Final  . GFR 01/20/2013 60.45  >60.00 mL/min Final  . Fructosamine 01/20/2013 284  <285 umol/L Final   Comment:  Variations in levels of serum proteins (albumin and immunoglobulins)                          may affect fructosamine results.                             Jacquelyne Balint, Ur 01/20/2013 2.4* 0.0 - 1.9 mg/dL Final  . Creatinine,U 01/20/2013 220.6   Final  . Microalb Creat Ratio 01/20/2013 1.1  0.0 - 30.0 mg/g Final  . Color,  Urine 01/20/2013 LT. YELLOW  Yellow;Lt. Yellow Final  . APPearance 01/20/2013 CLEAR  Clear Final  . Specific Gravity, Urine 01/20/2013 >=1.030  1.000 - 1.030 Final  . pH 01/20/2013 5.5  5.0 - 8.0 Final  . Total Protein, Urine 01/20/2013 NEGATIVE  Negative Final  . Urine Glucose 01/20/2013 NEGATIVE  Negative Final  . Ketones, ur 01/20/2013 NEGATIVE  Negative Final  . Bilirubin Urine 01/20/2013 NEGATIVE  Negative Final  . Hgb urine dipstick 01/20/2013 NEGATIVE  Negative Final  . Urobilinogen, UA 01/20/2013 0.2  0.0 - 1.0 Final  . Leukocytes, UA 01/20/2013 NEGATIVE  Negative Final  . Nitrite 01/20/2013 NEGATIVE  Negative Final  . WBC, UA 01/20/2013 0-2/hpf  0-2/hpf Final  . RBC / HPF 01/20/2013 0-2/hpf  0-2/hpf Final  . Squamous Epithelial / LPF 01/20/2013 Rare(0-4/hpf)  Rare(0-4/hpf) Final    Physical Examination:  BP 140/64  Pulse 53  Temp(Src) 98.7 F (37.1 C) (Oral)  Ht 5\' 6"  (1.676 m)  Wt 187 lb 8 oz (85.049 kg)  BMI 30.28 kg/m2  SpO2 98%  GENERAL:         Patient appears to have mild generalized obesity.     EXTREMITIES:     There is no edema.   ASSESSMENT/PLAN:  Diabetes type 2, uncontrolled - 250.02    He has had long-standing diabetes and is back on basal only insulin with fair control Currently only on small amount of basal insulin without any mealtime coverage Unable to use  usual hypoglycemic drugs because of renal insufficiency Most recent A1c is over 7% Unclear whether he has high readings after meals as he is not monitoring at that time Also not clear how accurate his meter is also  Since he is concerned about the cost of Lantus insulin we'll stop this Since he already has a supply of Humalog 75/25 at home will start this again with 8 units twice a day which will provide coverage of his meals also  His diet is reasonably good although not getting any protein at breakfast He will need to know diabetes education and meal planning advice which he has not had  before Also discussed balanced meals, avoiding regular soft drinks and regular exercise He was also given a glucose diary and discussed timing and targets of blood sugars  Complications:  none apparent, results of eye exam not available  Left leg nonhealing ulcer: Improving and followed by wound care center  RENAL insufficiency: Not clear if he is seeing a nephrologist, plan deferred to PCP. He has secondary issues including anemia and hyperuricemia  Promiss Labarbera 01/23/2013, 3:48 PM

## 2013-01-23 NOTE — Patient Instructions (Addendum)
Humalog mix 8 units before bfst and supper, stop lantus  Please check blood sugars at least half the time about 2 hours after any meal and as directed on waking up. Please bring blood sugar monitor to each visit  Walk upto 1/2 mile daily

## 2013-01-27 ENCOUNTER — Telehealth: Payer: Self-pay | Admitting: Internal Medicine

## 2013-01-27 DIAGNOSIS — D649 Anemia, unspecified: Secondary | ICD-10-CM

## 2013-01-27 DIAGNOSIS — I10 Essential (primary) hypertension: Secondary | ICD-10-CM

## 2013-01-27 DIAGNOSIS — L02419 Cutaneous abscess of limb, unspecified: Secondary | ICD-10-CM

## 2013-01-27 DIAGNOSIS — L03119 Cellulitis of unspecified part of limb: Secondary | ICD-10-CM

## 2013-01-27 DIAGNOSIS — E119 Type 2 diabetes mellitus without complications: Secondary | ICD-10-CM

## 2013-01-27 NOTE — Telephone Encounter (Signed)
Scott with [facility] is calling to request a verbal order to continue wound care services, 3x a week until October 28th. He can be reached back at [phone].

## 2013-02-03 NOTE — Telephone Encounter (Signed)
Spoke with [name] and he stated that orders was received for the pt.//AB/CMA

## 2013-02-18 ENCOUNTER — Ambulatory Visit: Payer: No Typology Code available for payment source | Admitting: *Deleted

## 2013-03-06 ENCOUNTER — Ambulatory Visit: Payer: No Typology Code available for payment source | Admitting: Endocrinology

## 2013-03-06 ENCOUNTER — Other Ambulatory Visit (INDEPENDENT_AMBULATORY_CARE_PROVIDER_SITE_OTHER): Payer: No Typology Code available for payment source

## 2013-03-06 DIAGNOSIS — E1129 Type 2 diabetes mellitus with other diabetic kidney complication: Secondary | ICD-10-CM

## 2013-03-06 LAB — URINALYSIS, ROUTINE W REFLEX MICROSCOPIC
Bilirubin Urine: NEGATIVE
Hgb urine dipstick: NEGATIVE
Ketones, ur: NEGATIVE
Total Protein, Urine: NEGATIVE
Urine Glucose: NEGATIVE
Urobilinogen, UA: 0.2 (ref 0.0–1.0)

## 2013-03-06 LAB — COMPREHENSIVE METABOLIC PANEL
ALT: 14 U/L (ref 0–53)
AST: 17 U/L (ref 0–37)
Albumin: 3.5 g/dL (ref 3.5–5.2)
CO2: 26 mEq/L (ref 19–32)
Calcium: 9.6 mg/dL (ref 8.4–10.5)
Chloride: 107 mEq/L (ref 96–112)
Creatinine, Ser: 1.5 mg/dL (ref 0.4–1.5)
GFR: 59.97 mL/min — ABNORMAL LOW (ref >60.00)
Potassium: 4.1 mEq/L (ref 3.5–5.1)
Sodium: 139 mEq/L (ref 135–145)
Total Protein: 7.1 g/dL (ref 6.0–8.3)

## 2013-03-06 LAB — MICROALBUMIN / CREATININE URINE RATIO
Creatinine,U: 193.5 mg/dL
Microalb Creat Ratio: 1 mg/g (ref 0.0–30.0)
Microalb, Ur: 1.9 mg/dL (ref 0.0–1.9)

## 2013-03-10 ENCOUNTER — Telehealth: Payer: Self-pay | Admitting: Endocrinology

## 2013-03-10 ENCOUNTER — Other Ambulatory Visit: Payer: Self-pay | Admitting: *Deleted

## 2013-03-10 MED ORDER — GLUCOSE BLOOD VI STRP: ORAL_STRIP | Status: DC

## 2013-03-15 ENCOUNTER — Other Ambulatory Visit: Payer: Self-pay | Admitting: Internal Medicine

## 2013-03-17 NOTE — Telephone Encounter (Signed)
Refills for Amlodipine, Clonidine, Isosorbide mononitrate and metoprolol sent to pharmacy

## 2013-03-20 ENCOUNTER — Other Ambulatory Visit: Payer: Self-pay

## 2013-03-21 ENCOUNTER — Ambulatory Visit: Payer: No Typology Code available for payment source | Admitting: Endocrinology

## 2013-04-18 ENCOUNTER — Encounter: Payer: Self-pay | Admitting: Endocrinology

## 2013-04-18 ENCOUNTER — Other Ambulatory Visit: Payer: Self-pay | Admitting: *Deleted

## 2013-04-18 ENCOUNTER — Ambulatory Visit (INDEPENDENT_AMBULATORY_CARE_PROVIDER_SITE_OTHER): Payer: Commercial Managed Care - HMO | Admitting: Endocrinology

## 2013-04-18 VITALS — BP 140/90 | HR 50 | Temp 98.2°F | Resp 12 | Ht 66.0 in | Wt 206.7 lb

## 2013-04-18 DIAGNOSIS — E1129 Type 2 diabetes mellitus with other diabetic kidney complication: Secondary | ICD-10-CM | POA: Diagnosis not present

## 2013-04-18 DIAGNOSIS — I10 Essential (primary) hypertension: Secondary | ICD-10-CM

## 2013-04-18 MED ORDER — GLUCOSE BLOOD VI STRP: ORAL_STRIP | Status: DC

## 2013-04-18 MED ORDER — INSULIN LISPRO PROT & LISPRO (75-25 MIX) 100 UNIT/ML KWIKPEN: PEN_INJECTOR | SUBCUTANEOUS | Status: DC

## 2013-04-18 NOTE — Patient Instructions (Signed)
Insulin 6 units before breakfast and supper  Please check blood sugars at least half the time about 2 hours after any meal and as directed on waking up. Please bring blood sugar monitor to each visit  See Dr Linna Darner for BP

## 2013-04-18 NOTE — Progress Notes (Signed)
Patient ID: Corey [name], male   DOB: 04-Aug-1945, 67 y.o.   MRN: [id number]  Reason for Appointment : Followup of Type 2 Diabetes  History of Present Illness          Diagnosis: Type 2 diabetes mellitus, date of diagnosis: 1997      Past history: The patient was initially treated with various oral hypoglycemic drugs and details are not available. He has generally been followed by his primary care physician About 5 years ago he was started on insulin and has either taken premixed insulin or Lantus once a day  INSULIN regimen is described as:  8 UNITS Humalog mix before this breakfast  Recent history:  Recently he has been taking only Humalog mix insulin, 8 units before breakfast He has difficulty affording his insulin and has gone back to premixed insulin on the last visit Again since he is not using and a monitor and does not keep a record difficult to assess his level of control.  Blood sugar is fairly good today in the office late morning without his insulin or breakfast No hypoglycemia with 8 units in the morning Does have difficulty losing weight and has gone up significantly since September    Glucose monitoring:  done one time a day         Glucometer: unknown      Blood Glucose readings: Does not remember the readings Hypoglycemia frequency: none recently.           Self-care: The diet that the patient has been following is: tries to limit carbohydrates.      Meals: 3 meals per day. Cereal in am, sometimes sandwich at lunch His largest meal is at suppertime           Physical activity: exercise: Walking 1/4 mile; 2-3 times a week.          Dietician visit: Most recent:never                Oral hypoglycemic drugs the patient is taking are:  none        Compliance with the medical regimen:  good  Retinal exam: Most recent: about a year ago .    Wt Readings from Last 3 Encounters:  04/18/13 206 lb 11.2 oz (93.759 kg)  01/23/13 187 lb 8 oz (85.049 kg)  01/08/13 182 lb 15.7 oz  (83 kg)    Lab Results  Component Value Date   HGBA1C 6.5 03/06/2013   HGBA1C 7.4* 11/08/2012   HGBA1C 8.4* 07/26/2012   Lab Results  Component Value Date   MICROALBUR 1.9 03/06/2013   LDLCALC 62 12/03/2012   CREATININE 1.5 03/06/2013       Medication List       This list is accurate as of: 04/18/13 11:44 AM.  Always use your most recent med list.               amLODipine 10 MG tablet  Commonly known as:  NORVASC  TAKE ONE TABLET BY MOUTH ONCE DAILY     cloNIDine 0.1 MG tablet  Commonly known as:  CATAPRES  TAKE ONE TABLET BY MOUTH ONCE DAILY     glucose blood test strip  Commonly known as:  ACCU-CHEK AVIVA  Use as instructed to check blood sugar once a day dx code 250.42     insulin glargine 100 UNIT/ML injection  Commonly known as:  LANTUS  Inject 0.1 mLs (10 Units total) into the skin daily.  ipratropium-albuterol 0.5-2.5 (3) MG/3ML Soln  Commonly known as:  DUONEB  Take 3 mLs by nebulization 3 (three) times daily as needed.     isosorbide mononitrate 60 MG 24 hr tablet  Commonly known as:  IMDUR  TAKE ONE TABLET BY MOUTH TWICE DAILY     metoprolol succinate 100 MG 24 hr tablet  Commonly known as:  TOPROL-XL  TAKE ONE TABLET BY MOUTH TWICE DAILY TAKE  WITH   OR  IMMEDIATELY  FOLLOWING A MEAL        Allergies:  Allergies  Allergen Reactions  . Hydrochlorothiazide     Gout , uncontrolled diabetes and renal insufficiency  . Allopurinol     Renal insufficiency    Past Medical History  Diagnosis Date  . Renal insufficiency   . Diabetes mellitus   . Hypertension   . Gout   . Colon polyp 2003    Dr [name]; F/U was to be 2008( not completed)  . Diverticulosis 2003  . Pneumonia 09/29/11    Avelox X 10 days as OP    Past Surgical History  Procedure Laterality Date  . Refractive surgery  2005  . Colonoscopy w/ polypectomy  2003    no F/U ([place] discussed 12/03/12)  . Wrist aspiration  02/16/12     monosodium urate crystals; Dr Caralyn Guile     Family History  Problem Relation Age of Onset  . Hypertension Father   . Prostate cancer Father   . Diabetes Father   . Hypertension Mother   . Diabetes Mother   . Stroke Neg Hx   . Heart disease Neg Hx     Social History:  reports that he quit smoking about 44 years ago. He has never used smokeless tobacco. He reports that he does not drink alcohol or use illicit drugs.    Review of Systems   Lipids: Only abnormality is low HDL, currently not on a statin drug  Eye exam 1-2 years, ? Retinopathy    He has renal insufficiency of unclear etiology, last creatinine  1.5  LABS:  Office Visit on 04/18/2013  Component Date Value Range Status  . POC Glucose 04/18/2013 146* 70 - 99 mg/dl Final     Physical Examination:  BP 140/90  Pulse 50  Temp(Src) 98.2 F (36.8 C)  Resp 12  Ht 5\' 6"  (1.676 m)  Wt 206 lb 11.2 oz (93.759 kg)  BMI 33.38 kg/m2  SpO2 97%  GENERAL:         Patient appears to have mild generalized obesity.     EXTREMITIES:     There is no edema.   ASSESSMENT/PLAN:  Diabetes type 2, uncontrolled - 250.02    He has had long-standing diabetes and is back on premixed insulin but only once a day Most likely is not getting coverage for his evening meal which is the larger of his meals Again difficult to know what his readings are as he has not kept a record are brought his meter today  Unable to use  usual hypoglycemic drugs because of renal insufficiency Most recent A1c is  under  7% but will need followup on the next visit  For now with divide his insulin dose to twice a day and use 6 units before breakfast and supper Emphasized timing of glucose monitoring and blood sugar targets  HYPERTENSION: This is not well controlled and he needs to followup with his PCP   [facility] 04/18/2013, 11:44 AM

## 2013-04-22 ENCOUNTER — Other Ambulatory Visit: Payer: Self-pay | Admitting: *Deleted

## 2013-04-22 MED ORDER — GLUCOSE BLOOD VI STRP: ORAL_STRIP | Status: DC

## 2013-04-23 ENCOUNTER — Ambulatory Visit: Payer: No Typology Code available for payment source | Admitting: *Deleted

## 2013-05-13 ENCOUNTER — Encounter: Payer: Self-pay | Admitting: *Deleted

## 2013-05-13 ENCOUNTER — Encounter: Payer: Medicare HMO | Attending: Endocrinology | Admitting: *Deleted

## 2013-05-13 VITALS — Ht 66.0 in | Wt 211.0 lb

## 2013-05-13 DIAGNOSIS — E119 Type 2 diabetes mellitus without complications: Secondary | ICD-10-CM | POA: Insufficient documentation

## 2013-05-13 DIAGNOSIS — Z794 Long term (current) use of insulin: Secondary | ICD-10-CM | POA: Insufficient documentation

## 2013-05-13 DIAGNOSIS — E1129 Type 2 diabetes mellitus with other diabetic kidney complication: Secondary | ICD-10-CM

## 2013-05-13 DIAGNOSIS — Z713 Dietary counseling and surveillance: Secondary | ICD-10-CM | POA: Insufficient documentation

## 2013-05-13 NOTE — Progress Notes (Signed)
Appt start time: 1430 end time:  1600.  Assessment:  Patient was seen on  05/13/13 for individual diabetes education. Initial diagnosis of T2DM approx 10 yrs ago. He received education upon diagnosis.  Lives with his wife. Wife does shopping and cooking. Patient tests glucose FBS and 2hpp dinner. FBS average 200mg /d. And 2hpp avg 146mg /dl. Patient thinks that his brother has diabetes. Does not believe that either of his parents had diabetes. He has not made an appointment with Dr. Linna Darner for his blood pressure as of this date.  Current HbA1c: 6.5%  Preferred Learning Style:   No preference indicated   Learning Readiness:   Not ready  Contemplating  Ready  Change in progress  MEDICATIONS: See List, Insulin Mix... He is taking 8units BID although I believe Dr. Dwyane Dee ar recommended 6u BID.  dietary recall:  B ( AM): rice krispies, boiled eggs, scrambled eggs, water  Snk ( AM): peanut butter crackers  L ( PM): none Snk ( PM): none D ( PM): cubed steak, white rice, green beans/ peas Snk ( PM): ice cream (rare) Beverages: water, 2% milk, sweet tea (sugar)  Usual physical activity: walk 3 X weekly  Intervention:  Nutrition counseling provided.  Discussed diabetes disease process and treatment options.  Discussed physiology of diabetes and role of obesity on insulin resistance.  Encouraged moderate weight reduction to improve glucose levels.  Discussed role of medications and diet in glucose control  Provided education on macronutrients on glucose levels.  Provided education on carb counting, importance of regularly scheduled meals/snacks, and meal planning  Discussed effects of physical activity on glucose levels and long-term glucose control.  Recommended 150 minutes of physical activity/week.  Reviewed patient medications.  Discussed role of medication on blood glucose and possible side effects  Discussed blood glucose monitoring and interpretation.  Discussed recommended target  ranges and individual ranges.    Described short-term complications: hyper- and hypo-glycemia.  Discussed causes,symptoms, and treatment options.  Discussed prevention, detection, and treatment of long-term complications.  Discussed the role of prolonged elevated glucose levels on body systems.  Discussed role of stress on blood glucose levels and discussed strategies to manage psychosocial issues.  Discussed recommendations for long-term diabetes self-care.  Established checklist for medical, dental, and emotional self-care.  Plan:  Aim for 2-3 Carb Choices per meal (30-45 grams) +/- 1 either way  Aim for 0-1 Carbs per snack if hungry  Consider reading food labels for Total Carbohydrate and Fat Grams of foods Consider  increasing your activity level by walking for 30 minutes daily as tolerated Continue checking BG at alternate times per day to include "fasting" and 2 hours after dinner as directed by MD and Put all readings in log book. Always take log book with you to your Dr. appt. Continue taking medication as directed by MD Consider using Splenda in your ice tea rather than sugar Try to avoid fried foods, remove chicken skin Always have a protein with each meal and snack Have a snack before going to bed with one serving of carbohydrate and protein  Teaching Method Utilized:  Visual Auditory Hands on  Handouts given during visit include: Living Well with Diabetes Carb Counting and Food Label handouts Meal Plan Card  Barriers to learning/adherence to lifestyle change: none noted  Diabetes self-care support plan:   [facility] support group  Wife  Demonstrated degree of understanding via:  Verbalized understanding   Monitoring/Evaluation:  Dietary intake, exercise, test glucose, and body weight follow up prn.

## 2013-05-13 NOTE — Patient Instructions (Addendum)
Plan:  Aim for 2-3 Carb Choices per meal (30-45 grams) +/- 1 either way  Aim for 0-1 Carbs per snack if hungry  Consider reading food labels for Total Carbohydrate and Fat Grams of foods Consider  increasing your activity level by walking for 30 minutes daily as tolerated Continue checking BG at alternate times per day to include FBS and 2h after dinner as directed by MD  Always put all readings in log book Always take log book to Dr. Thomasene Lot. Continue taking medication as directed by MD Consider using Splenda in your ice tea rather than sugar Try to avoid fried foods Always have a protein with each meal and snack  Have a snack before going to bed with one serving of carbohydrate and protein

## 2013-06-03 ENCOUNTER — Ambulatory Visit: Payer: Medicare Other | Admitting: Endocrinology

## 2013-06-03 ENCOUNTER — Encounter: Payer: Self-pay | Admitting: *Deleted

## 2013-06-03 DIAGNOSIS — Z0289 Encounter for other administrative examinations: Secondary | ICD-10-CM

## 2013-06-09 ENCOUNTER — Other Ambulatory Visit: Payer: Self-pay | Admitting: *Deleted

## 2013-06-09 MED ORDER — AMLODIPINE BESYLATE 10 MG PO TABS: ORAL_TABLET | ORAL | Status: DC

## 2013-06-09 MED ORDER — ISOSORBIDE MONONITRATE ER 60 MG PO TB24: ORAL_TABLET | ORAL | Status: DC

## 2013-06-09 MED ORDER — METOPROLOL SUCCINATE ER 100 MG PO TB24: ORAL_TABLET | ORAL | Status: DC

## 2013-06-09 MED ORDER — CLONIDINE HCL 0.1 MG PO TABS: ORAL_TABLET | ORAL | Status: DC

## 2013-06-17 ENCOUNTER — Ambulatory Visit: Payer: Medicare Other | Admitting: Endocrinology

## 2013-07-02 ENCOUNTER — Ambulatory Visit: Payer: Commercial Managed Care - HMO | Admitting: Nutrition

## 2013-07-02 ENCOUNTER — Ambulatory Visit (INDEPENDENT_AMBULATORY_CARE_PROVIDER_SITE_OTHER): Payer: Medicare HMO | Admitting: Endocrinology

## 2013-07-02 ENCOUNTER — Encounter: Payer: Medicare HMO | Attending: Endocrinology | Admitting: Nutrition

## 2013-07-02 ENCOUNTER — Encounter: Payer: Self-pay | Admitting: Endocrinology

## 2013-07-02 VITALS — BP 158/88 | HR 60 | Temp 98.0°F | Resp 16 | Ht 66.0 in | Wt 221.4 lb

## 2013-07-02 VITALS — BP 158/80

## 2013-07-02 DIAGNOSIS — N182 Chronic kidney disease, stage 2 (mild): Secondary | ICD-10-CM

## 2013-07-02 DIAGNOSIS — E1129 Type 2 diabetes mellitus with other diabetic kidney complication: Secondary | ICD-10-CM

## 2013-07-02 DIAGNOSIS — E1165 Type 2 diabetes mellitus with hyperglycemia: Secondary | ICD-10-CM

## 2013-07-02 DIAGNOSIS — Z794 Long term (current) use of insulin: Secondary | ICD-10-CM | POA: Insufficient documentation

## 2013-07-02 DIAGNOSIS — Z713 Dietary counseling and surveillance: Secondary | ICD-10-CM | POA: Insufficient documentation

## 2013-07-02 DIAGNOSIS — E119 Type 2 diabetes mellitus without complications: Secondary | ICD-10-CM | POA: Insufficient documentation

## 2013-07-02 NOTE — Progress Notes (Addendum)
Patient ID: Corey [name], male   DOB: 1946/04/26, 68 y.o.   MRN: [id number]   Reason for Appointment : Followup of Type 2 Diabetes  History of Present Illness          Diagnosis: Type 2 diabetes mellitus, date of diagnosis: 1997      Past history: The patient was initially treated with various oral hypoglycemic drugs and details are not available. About 2009 he was started on insulin and has either taken premixed insulin or Lantus once a day  Recent history:   He had difficulty affording his Lantus insulin and prefers to take premixed insulin On his last visit he had a relatively good glucose reading in the office; since he was taking insulin only in the morning and not with his main meal in the evening he was told to take 6 units twice a day instead of 8 units in the morning He thinks his blood sugars have been going up for the last couple months and he has brought in his glucose monitor for review for the first time Does have difficulty losing weight and has gone up progressively since 9/14 Has not been on metformin because of periodic high levels of creatinine Compliance: Although he was instructed on taking insulin before meals he is taking his evening insulin a couple of hours after eating when he checks his blood sugar  INSULIN regimen is described as:  6 UNITS Humalog mix bid    Glucose monitoring:  done 0.5 times a day         Glucometer:  Accu-Chek      Blood Glucose readings:   PREMEAL Breakfast Lunch  supper  Bedtime Overall  Glucose range:  204-331   ?     Mean/median:  264      255    POST-MEAL PC Breakfast PC Lunch PC Dinner  Glucose range:  199-253    185-354   Mean/median:      Hypoglycemia frequency: none recently.           Self-care:       Meals: 3 meals per day. Cereal in am, sometimes sandwich at lunch. His largest meal is at suppertime Physical activity: exercise: Walking 1/4 mile; 2-3 times a week, recently irregular.          Dietician visit: Most  recent:never                Oral hypoglycemic drugs the patient is taking are:  none        Compliance with the medical regimen:  good  Retinal exam: Most recent: about a year ago .    Wt Readings from Last 3 Encounters:  07/02/13 221 lb 6.4 oz (100.426 kg)  05/13/13 211 lb (95.709 kg)  04/18/13 206 lb 11.2 oz (93.759 kg)    Lab Results  Component Value Date   HGBA1C 6.5 03/06/2013   HGBA1C 7.4* 11/08/2012   HGBA1C 8.4* 07/26/2012   Lab Results  Component Value Date   MICROALBUR 1.9 03/06/2013   LDLCALC 62 12/03/2012   CREATININE 1.5 03/06/2013       Medication List       This list is accurate as of: 07/02/13  1:39 PM.  Always use your most recent med list.               amLODipine 10 MG tablet  Commonly known as:  NORVASC  TAKE ONE TABLET BY MOUTH ONCE DAILY     cloNIDine 0.1 MG  tablet  Commonly known as:  CATAPRES  TAKE ONE TABLET BY MOUTH ONCE DAILY     glucose blood test strip  Commonly known as:  ACCU-CHEK AVIVA PLUS  Use as instructed to check blood sugars 2 times per day dx code 250.42     Insulin Lispro Prot & Lispro (75-25) 100 UNIT/ML Kwikpen  Commonly known as:  HUMALOG MIX 75/25 PEN  Inject 6 units twice a day     ipratropium-albuterol 0.5-2.5 (3) MG/3ML Soln  Commonly known as:  DUONEB  Take 3 mLs by nebulization 3 (three) times daily as needed.     isosorbide mononitrate 60 MG 24 hr tablet  Commonly known as:  IMDUR  TAKE ONE TABLET BY MOUTH TWICE DAILY     metoprolol succinate 100 MG 24 hr tablet  Commonly known as:  TOPROL-XL  TAKE ONE TABLET BY MOUTH TWICE DAILY TAKE  WITH   OR  IMMEDIATELY  FOLLOWING A MEAL        Allergies:  Allergies  Allergen Reactions  . Hydrochlorothiazide     Gout , uncontrolled diabetes and renal insufficiency  . Allopurinol     Renal insufficiency    Past Medical History  Diagnosis Date  . Renal insufficiency   . Diabetes mellitus   . Hypertension   . Gout   . Colon polyp 2003    Dr [name];  F/U was to be 2008( not completed)  . Diverticulosis 2003  . Pneumonia 09/29/11    Avelox X 10 days as OP    Past Surgical History  Procedure Laterality Date  . Refractive surgery  2005  . Colonoscopy w/ polypectomy  2003    no F/U ([place] discussed 12/03/12)  . Wrist aspiration  02/16/12     monosodium urate crystals; Dr Caralyn Guile    Family History  Problem Relation Age of Onset  . Hypertension Father   . Prostate cancer Father   . Diabetes Father   . Hypertension Mother   . Diabetes Mother   . Stroke Neg Hx   . Heart disease Neg Hx     Social History:  reports that he quit smoking about 45 years ago. He has never used smokeless tobacco. He reports that he does not drink alcohol or use illicit drugs.    Review of Systems   Lipids: Only abnormality is low HDL, currently not on a statin drug  Lab Results  Component Value Date   CHOL 118 12/03/2012   HDL 36.30* 12/03/2012   LDLCALC 62 12/03/2012   TRIG 98.0 12/03/2012   CHOLHDL 3 12/03/2012    Eye exam 1-2 years, ? Retinopathy    He has mild renal insufficiency of unclear etiology with variable creatinine levels  Lab Results  Component Value Date   CREATININE 1.5 03/06/2013   HYPERTENSION: He has not followed up with his PCP and his blood pressure is consistently high. He thinks he is compliant with his clonidine and took it this morning. Also taking Norvasc and metoprolol 100 mg   Physical Examination:  BP 158/88  Pulse 60  Temp(Src) 98 F (36.7 C)  Resp 16  Ht 5\' 6"  (1.676 m)  Wt 221 lb 6.4 oz (100.426 kg)  BMI 35.75 kg/m2  SpO2 96%  No ankle edema  ASSESSMENT/PLAN:  Diabetes type 2, uncontrolled - 250.02    He has had long-standing diabetes with variable control Previously appeared to be getting relatively good control with low doses of insulin only. Although he is taking  his evening insulin inappropriately 2 hours after eating is still getting consistently high readings in the morning also Not clear why  he is hyperglycemic but he has gained significant amount of weight in the last 5 months and is likely to be insulin resistant He does not think he has changed his diet  Recommendation made today:  Double the dose of insulin to 12 units twice a day  Start taking evening insulin a few minutes before eating rather than postprandially  Consultation with nurse educator for starting diabetes education    start Victoza for multiple benefits of weight loss, glucagon effect, promoting insulin secretion and increase satiety. Was shown how to use the Victoza pen and detailed instructions also given by the nurse educator. Discussed possible side effects including nausea  More frequent glucose monitoring especially after waking up, after lunch and dinner  Followup in 3 weeks with A1c  HYPERTENSION: This is not well controlled and  for now will increase his clonidine to 3 times a day;he needs to followup with his PCP  Counseling time over 50% of today's 25 minute visit  Mitzi Lilja 07/02/2013, 1:39 PM

## 2013-07-02 NOTE — Progress Notes (Signed)
This patient is here to start on Victoza and to review insulin doses and when to take them. We discussed how the [place] works, and how to dose this.  Written instructions were given for 0.6 to be given once a day for 7 days.  If no nausea, he will increase the dose to 1.2, and stay at that dose.  A sample was given to him by Dr. Dwyane Dee, and we reviewed how to dial in the dose.  He reported good understanding of this.  We also discussed how his insulin works, and when to take it.   He reports taking his AM dose 1 hour before eating his breakfast, and taking his PM dose 2 hr. pc supper.  Written instructions were given to take 12 units of the insulin 10 min. Before his breakfast and supper.  He reported good understanding of this.   He had no final questions.  I will call him in 1 week to see how he is doing.

## 2013-07-02 NOTE — Patient Instructions (Addendum)
Start VICTOZA injection with the sample pen once daily at the same time of the day.  Dial the dose to 0.6 mg for the first week.  You may  experience nausea in the first few days which usually gets better the After 1 week increase the dose to 1.2mg  daily if no nausea.  You may inject in the stomach, thigh or arm.   You will feel fullness of the stomach with starting the medication and should try to keep portions of food small.    INSULIN BEFORE BOTH MEALS: 12 UNITS  Please check blood sugars SOME times about 2 hours after any meal and as directed on waking up. Please bring blood sugar monitor to each visit  Clonidine THREE TIMES daily

## 2013-07-02 NOTE — Patient Instructions (Signed)
Take 12 units of 75/25 insulin 10 min. Before breakfast and supper Take Victoza once a day--0.6 for 7 days, if no nausea after 7 days, increase the dose to 1.2.

## 2013-07-09 ENCOUNTER — Telehealth: Payer: Self-pay | Admitting: Endocrinology

## 2013-07-09 NOTE — Telephone Encounter (Signed)
Phone Call was made to patient to see if he is taking his Victoza and his insulin before meals Patient reports that he is taking his Victoza, and is still taking 0.6.  Says he still has one more day before he increases his does.  He denies any nausea. He also reports that he is taking his insulin 10 min before eating breakfast and supper, and that his FBSs are much better.  The last time he took his FBS-which was 2 days ago, it was 140mg .   He had no questions for me at this time.

## 2013-07-21 ENCOUNTER — Other Ambulatory Visit (INDEPENDENT_AMBULATORY_CARE_PROVIDER_SITE_OTHER): Payer: Medicare HMO

## 2013-07-21 DIAGNOSIS — E1165 Type 2 diabetes mellitus with hyperglycemia: Principal | ICD-10-CM

## 2013-07-21 DIAGNOSIS — E1129 Type 2 diabetes mellitus with other diabetic kidney complication: Secondary | ICD-10-CM

## 2013-07-21 LAB — BASIC METABOLIC PANEL
BUN: 23 mg/dL (ref 6–23)
CHLORIDE: 113 meq/L — AB (ref 96–112)
CO2: 23 mEq/L (ref 19–32)
Calcium: 9.7 mg/dL (ref 8.4–10.5)
Creatinine, Ser: 1.9 mg/dL — ABNORMAL HIGH (ref 0.4–1.5)
GFR: 46.8 mL/min — ABNORMAL LOW (ref >60.00)
GLUCOSE: 102 mg/dL — AB (ref 70–99)
POTASSIUM: 4.2 meq/L (ref 3.5–5.1)
Sodium: 144 mEq/L (ref 135–145)

## 2013-07-21 LAB — LIPID PANEL
CHOLESTEROL: 110 mg/dL (ref 0–200)
HDL: 46.9 mg/dL (ref >39.00)
LDL CALC: 53 mg/dL (ref 0–99)
Total CHOL/HDL Ratio: 2
Triglycerides: 49 mg/dL (ref 0.0–149.0)
VLDL: 9.8 mg/dL (ref 0.0–40.0)

## 2013-07-21 LAB — HEMOGLOBIN A1C: Hgb A1c MFr Bld: 9.2 % — ABNORMAL HIGH (ref 4.6–6.5)

## 2013-07-23 ENCOUNTER — Other Ambulatory Visit: Payer: Self-pay | Admitting: *Deleted

## 2013-07-23 ENCOUNTER — Encounter: Payer: Self-pay | Admitting: Endocrinology

## 2013-07-23 ENCOUNTER — Ambulatory Visit (INDEPENDENT_AMBULATORY_CARE_PROVIDER_SITE_OTHER): Payer: Medicare HMO | Admitting: Endocrinology

## 2013-07-23 VITALS — BP 132/85 | HR 74 | Temp 98.1°F | Resp 16 | Ht 66.0 in | Wt 221.8 lb

## 2013-07-23 DIAGNOSIS — E1129 Type 2 diabetes mellitus with other diabetic kidney complication: Secondary | ICD-10-CM

## 2013-07-23 DIAGNOSIS — I10 Essential (primary) hypertension: Secondary | ICD-10-CM

## 2013-07-23 DIAGNOSIS — E1165 Type 2 diabetes mellitus with hyperglycemia: Principal | ICD-10-CM

## 2013-07-23 MED ORDER — LIRAGLUTIDE 18 MG/3ML ~~LOC~~ SOPN: 1.2000 mg | PEN_INJECTOR | Freq: Every day | SUBCUTANEOUS | Status: DC

## 2013-07-23 NOTE — Patient Instructions (Addendum)
HUMALOG MIX insulin (gray) take 10 units in am and 12 at supper  No change in Victoza  More sugars at bedtime and some in afternoon, call if < 70

## 2013-07-23 NOTE — Progress Notes (Signed)
Patient ID: Corey [name], male   DOB: 06/05/1945, 68 y.o.   MRN: [id number]   Reason for Appointment : Followup of Type 2 Diabetes  History of Present Illness          Diagnosis: Type 2 diabetes mellitus, date of diagnosis: 1997      Past history: The patient was initially treated with various oral hypoglycemic drugs and details are not available. About 2009 he was started on insulin and has either taken premixed insulin or Lantus once a day He had difficulty affording his Lantus insulin and prefers to take premixed insulin  Recent history:    On his last visit because of markedly higher readings he was started on Victoza in addition to his insulin Also was having difficulty with progressive weight gain prior to this Also his premixed insulin dose was doubled from the previous dose of 6 units twice a day  He has done well with titrating the Victoza  to 1.2 mg and has had no nausea. Although he has had no weight loss his blood sugars appear to be significantly better; had blood sugars as high as 354 previously He thinks he is compliant we'll recently with taking his insulin before meals as directed  INSULIN regimen is described as:  12 UNITS Humalog mix bid    Glucose monitoring:  done 0.5 times a day         Glucometer:  Accu-Chek      Blood Glucose readings recently:   PREMEAL Breakfast Lunch Dinner Bedtime Overall  Glucose range: 95--172  99-127   118     Mean/median:     173   Hypoglycemia frequency: none recently.           Self-care:       Meals: 3 meals per day. Egg in am, sometimes sandwich at lunch. His largest meal is at suppertime, 8-9 pm Physical activity: exercise: Walking 1/4 mile; 2-3 times a week, recently irregular.          Dietician visit: Most recent:never                Oral hypoglycemic drugs the patient is taking are:  none        Compliance with the medical regimen:  good  Retinal exam: Most recent: about a year ago .    Wt Readings from Last 3  Encounters:  07/23/13 221 lb 12.8 oz (100.608 kg)  07/02/13 221 lb 6.4 oz (100.426 kg)  05/13/13 211 lb (95.709 kg)    Lab Results  Component Value Date   HGBA1C 9.2* 07/21/2013   HGBA1C 6.5 03/06/2013   HGBA1C 7.4* 11/08/2012   Lab Results  Component Value Date   MICROALBUR 1.9 03/06/2013   LDLCALC 53 07/21/2013   CREATININE 1.9* 07/21/2013       Medication List       This list is accurate as of: 07/23/13  2:18 PM.  Always use your most recent med list.               amLODipine 10 MG tablet  Commonly known as:  NORVASC  TAKE ONE TABLET BY MOUTH ONCE DAILY     cloNIDine 0.1 MG tablet  Commonly known as:  CATAPRES  TAKE ONE TABLET BY MOUTH ONCE DAILY     glucose blood test strip  Commonly known as:  ACCU-CHEK AVIVA PLUS  Use as instructed to check blood sugars 2 times per day dx code 250.42     Insulin  Lispro Prot & Lispro (75-25) 100 UNIT/ML Kwikpen  Commonly known as:  HUMALOG 75/25 MIX  12 Units. Inject 12 units twice a day     ipratropium-albuterol 0.5-2.5 (3) MG/3ML Soln  Commonly known as:  DUONEB  Take 3 mLs by nebulization 3 (three) times daily as needed.     isosorbide mononitrate 60 MG 24 hr tablet  Commonly known as:  IMDUR  TAKE ONE TABLET BY MOUTH TWICE DAILY     metoprolol succinate 100 MG 24 hr tablet  Commonly known as:  TOPROL-XL  TAKE ONE TABLET BY MOUTH TWICE DAILY TAKE  WITH   OR  IMMEDIATELY  FOLLOWING A MEAL        Allergies:  Allergies  Allergen Reactions  . Hydrochlorothiazide     Gout , uncontrolled diabetes and renal insufficiency  . Allopurinol     Renal insufficiency    Past Medical History  Diagnosis Date  . Renal insufficiency   . Diabetes mellitus   . Hypertension   . Gout   . Colon polyp 2003    Dr [name]; F/U was to be 2008( not completed)  . Diverticulosis 2003  . Pneumonia 09/29/11    Avelox X 10 days as OP    Past Surgical History  Procedure Laterality Date  . Refractive surgery  2005  . Colonoscopy  w/ polypectomy  2003    no F/U ([place] discussed 12/03/12)  . Wrist aspiration  02/16/12     monosodium urate crystals; Dr Caralyn Guile    Family History  Problem Relation Age of Onset  . Hypertension Father   . Prostate cancer Father   . Diabetes Father   . Hypertension Mother   . Diabetes Mother   . Stroke Neg Hx   . Heart disease Neg Hx     Social History:  reports that he quit smoking about 45 years ago. He has never used smokeless tobacco. He reports that he does not drink alcohol or use illicit drugs.    Review of Systems   Lipids: Only abnormality is low HDL, currently not on a statin drug  Lab Results  Component Value Date   CHOL 110 07/21/2013   HDL 46.90 07/21/2013   LDLCALC 53 07/21/2013   TRIG 49.0 07/21/2013   CHOLHDL 2 07/21/2013    Eye exam 1-2 years, ? Retinopathy    He has mild renal insufficiency of unclear etiology with variable creatinine levels  Lab Results  Component Value Date   CREATININE 1.9* 07/21/2013   HYPERTENSION: He has not followed up with his PCP . Because of higher readings his clonidine was increased to 3 times a day. No side effects from this and blood pressure is better  Also taking Norvasc and metoprolol 100 mg   Physical Examination:  BP 132/85  Pulse 74  Temp(Src) 98.1 F (36.7 C)  Resp 16  Ht 5\' 6"  (1.676 m)  Wt 221 lb 12.8 oz (100.608 kg)  BMI 35.82 kg/m2  SpO2 96%  No ankle edema  ASSESSMENT/PLAN:  Diabetes type 2, uncontrolled - 250.02    He has much better blood sugar readings recently with adding Victoza to his regimen as well as increasing his insulin Also his weight gain has leveled off with using Victoza  Recommendation made today: Continue Victoza 1.2 mg daily, prescription to be sent Reduce morning dose to 10 units to avoid hypoglycemia during the day    [facility] 07/23/2013, 2:18 PM

## 2013-07-24 ENCOUNTER — Telehealth: Payer: Self-pay | Admitting: *Deleted

## 2013-07-24 NOTE — Telephone Encounter (Signed)
Victoza is $400, he wants to know if there is a cheaper alternative?

## 2013-07-24 NOTE — Telephone Encounter (Signed)
He needs to ask his insurance Co

## 2013-07-24 NOTE — Telephone Encounter (Signed)
Noted patient is aware and will let me know

## 2013-07-25 ENCOUNTER — Other Ambulatory Visit: Payer: Self-pay | Admitting: *Deleted

## 2013-07-25 MED ORDER — LIRAGLUTIDE 18 MG/3ML ~~LOC~~ SOPN: 1.2000 mg | PEN_INJECTOR | Freq: Every day | SUBCUTANEOUS | Status: DC

## 2013-07-30 ENCOUNTER — Other Ambulatory Visit: Payer: Self-pay | Admitting: *Deleted

## 2013-09-19 ENCOUNTER — Other Ambulatory Visit (INDEPENDENT_AMBULATORY_CARE_PROVIDER_SITE_OTHER): Payer: Medicare HMO

## 2013-09-19 DIAGNOSIS — E1129 Type 2 diabetes mellitus with other diabetic kidney complication: Secondary | ICD-10-CM

## 2013-09-19 DIAGNOSIS — E1165 Type 2 diabetes mellitus with hyperglycemia: Principal | ICD-10-CM

## 2013-09-19 LAB — COMPREHENSIVE METABOLIC PANEL
ALK PHOS: 47 U/L (ref 39–117)
ALT: 17 U/L (ref 0–53)
AST: 20 U/L (ref 0–37)
Albumin: 4.1 g/dL (ref 3.5–5.2)
BILIRUBIN TOTAL: 0.7 mg/dL (ref 0.2–1.2)
BUN: 28 mg/dL — AB (ref 6–23)
CO2: 24 mEq/L (ref 19–32)
CREATININE: 1.9 mg/dL — AB (ref 0.4–1.5)
Calcium: 9.9 mg/dL (ref 8.4–10.5)
Chloride: 111 mEq/L (ref 96–112)
GFR: 45.65 mL/min — ABNORMAL LOW (ref >60.00)
GLUCOSE: 123 mg/dL — AB (ref 70–99)
Potassium: 4.1 mEq/L (ref 3.5–5.1)
SODIUM: 142 meq/L (ref 135–145)
TOTAL PROTEIN: 7.2 g/dL (ref 6.0–8.3)

## 2013-09-19 LAB — HEMOGLOBIN A1C: Hgb A1c MFr Bld: 8.4 % — ABNORMAL HIGH (ref 4.6–6.5)

## 2013-09-22 ENCOUNTER — Encounter: Payer: Self-pay | Admitting: Endocrinology

## 2013-09-22 ENCOUNTER — Ambulatory Visit (INDEPENDENT_AMBULATORY_CARE_PROVIDER_SITE_OTHER): Payer: Medicare HMO | Admitting: Endocrinology

## 2013-09-22 VITALS — BP 142/90 | HR 70 | Temp 98.0°F | Resp 14 | Ht 66.0 in | Wt 229.8 lb

## 2013-09-22 DIAGNOSIS — E1165 Type 2 diabetes mellitus with hyperglycemia: Principal | ICD-10-CM

## 2013-09-22 DIAGNOSIS — E1129 Type 2 diabetes mellitus with other diabetic kidney complication: Secondary | ICD-10-CM

## 2013-09-22 NOTE — Progress Notes (Signed)
Patient ID: Corey [name], male   DOB: 02-14-1946, 68 y.o.   MRN: [id number]   Reason for Appointment : Followup of Type 2 Diabetes  History of Present Illness          Diagnosis: Type 2 diabetes mellitus, date of diagnosis: 1997      Past history: The patient was initially treated with various oral hypoglycemic drugs and details are not available. About 2009 he was started on insulin and has either taken premixed insulin or Lantus once a day He had difficulty affording his Lantus insulin and prefers to take premixed insulin  Recent history:    He has been  on Victoza in addition to his insulin since 06/2013 He was having difficulty with progressive weight gain prior to this However since his last visit he has gained 8 pounds despite continuing to be compliant with Victoza 1.2 mg Still taking low-dose insulin twice a day, was told to reduce morning dose to 10 units but is still taking 12, is apparently compliant with this by history He thinks he has difficulty watching his diet especially portions His A1c is still relatively high and for some reason his morning sugars are relatively higher. Usually not checking readings after supper INSULIN regimen is described as:  12 UNITS Humalog mix bid    Glucose monitoring:  done 0.6 times a day         Glucometer:  Accu-Chek      Blood Glucose readings recently:   PREMEAL Breakfast Lunch Dinner Bedtime Overall  Glucose range:  146-201   186   100-177    100-201   Mean/median:  170    137    152    Hypoglycemia frequency: none recently.           Self-care:       Meals: 3 meals per day. Egg in am, sometimes sandwich at lunch. His largest meal is at suppertime, 8-9 pm. Large portions Physical activity: exercise: Walking 30-40; 3/7        Dietician visit: Most recent:never                Oral hypoglycemic drugs the patient is taking are:  none        Compliance with the medical regimen:  good  Retinal exam: Most recent: about a year ago .     Wt Readings from Last 3 Encounters:  09/22/13 229 lb 12.8 oz (104.237 kg)  07/23/13 221 lb 12.8 oz (100.608 kg)  07/02/13 221 lb 6.4 oz (100.426 kg)    Lab Results  Component Value Date   HGBA1C 8.4* 09/19/2013   HGBA1C 9.2* 07/21/2013   HGBA1C 6.5 03/06/2013   Lab Results  Component Value Date   MICROALBUR 1.9 03/06/2013   LDLCALC 53 07/21/2013   CREATININE 1.9* 09/19/2013       Medication List       This list is accurate as of: 09/22/13  3:18 PM.  Always use your most recent med list.               amLODipine 10 MG tablet  Commonly known as:  NORVASC  TAKE ONE TABLET BY MOUTH ONCE DAILY     cloNIDine 0.1 MG tablet  Commonly known as:  CATAPRES  TAKE ONE TABLET BY MOUTH ONCE DAILY     glucose blood test strip  Commonly known as:  ACCU-CHEK AVIVA PLUS  Use as instructed to check blood sugars 2 times per day dx code 250.42  Insulin Lispro Prot & Lispro (75-25) 100 UNIT/ML Kwikpen  Commonly known as:  HUMALOG 75/25 MIX  12 Units. Inject 12 units twice a day     ipratropium-albuterol 0.5-2.5 (3) MG/3ML Soln  Commonly known as:  DUONEB  Take 3 mLs by nebulization 3 (three) times daily as needed.     isosorbide mononitrate 60 MG 24 hr tablet  Commonly known as:  IMDUR  TAKE ONE TABLET BY MOUTH TWICE DAILY     Liraglutide 18 MG/3ML Sopn  Inject 1.2 mg into the skin daily.     metoprolol succinate 100 MG 24 hr tablet  Commonly known as:  TOPROL-XL  TAKE ONE TABLET BY MOUTH TWICE DAILY TAKE  WITH   OR  IMMEDIATELY  FOLLOWING A MEAL        Allergies:  Allergies  Allergen Reactions  . Hydrochlorothiazide     Gout , uncontrolled diabetes and renal insufficiency  . Allopurinol     Renal insufficiency    Past Medical History  Diagnosis Date  . Renal insufficiency   . Diabetes mellitus   . Hypertension   . Gout   . Colon polyp 2003    Dr [name]; F/U was to be 2008( not completed)  . Diverticulosis 2003  . Pneumonia 09/29/11    Avelox X 10 days as  OP    Past Surgical History  Procedure Laterality Date  . Refractive surgery  2005  . Colonoscopy w/ polypectomy  2003    no F/U ([place] discussed 12/03/12)  . Wrist aspiration  02/16/12     monosodium urate crystals; Dr Caralyn Guile    Family History  Problem Relation Age of Onset  . Hypertension Father   . Prostate cancer Father   . Diabetes Father   . Hypertension Mother   . Diabetes Mother   . Stroke Neg Hx   . Heart disease Neg Hx     Social History:  reports that he quit smoking about 45 years ago. He has never used smokeless tobacco. He reports that he does not drink alcohol or use illicit drugs.    Review of Systems   Lipids: Only abnormality is low HDL, currently not on a statin drug  Lab Results  Component Value Date   CHOL 110 07/21/2013   HDL 46.90 07/21/2013   LDLCALC 53 07/21/2013   TRIG 49.0 07/21/2013   CHOLHDL 2 07/21/2013    Eye exam 1-2 years, ? Retinopathy    He has mild renal insufficiency of unclear etiology with variable creatinine levels  Lab Results  Component Value Date   CREATININE 1.9* 09/19/2013   HYPERTENSION: He has not followed up with his PCP . Because of higher readings his clonidine was increased to 3 times a day. No side effects from this.  Also taking Norvasc and metoprolol 100 mg   Physical Examination:  BP 142/90  Pulse 70  Temp(Src) 98 F (36.7 C)  Resp 14  Ht 5\' 6"  (1.676 m)  Wt 229 lb 12.8 oz (104.237 kg)  BMI 37.11 kg/m2  SpO2 96%    ASSESSMENT/PLAN:  Diabetes type 2, uncontrolled   Blood sugars are again higher despite using Victoza and premixed insulin together He has gained weight from poor compliance with diet even though he did quite well with initially starting Victoza He does need to do better with portion control and exercise  Recommendation made today: Victoza 1.8 mg daily, prescription to be sent Reduce morning dose to 8 units to avoid hypoglycemia during  the day and increase evening dose to 14 Will again make  referral for nutritional counseling   Elayne Snare 09/22/2013, 3:18 PM

## 2013-09-22 NOTE — Patient Instructions (Signed)
Victoza 1.8 mg daily  INSULIN 8 UNITS AM AND 14 AT SUPPER  Please check blood sugars at least half the time about 2 hours after any meal and as directed on waking up. Please bring blood sugar monitor to each visit

## 2013-10-14 IMAGING — CR DG CHEST 1V PORT
1 series · 1 of 1 positions shown · non-contrast
Comparison: 10/14/2012

CLINICAL DATA: Respiratory failure

PORTABLE CHEST - 1 VIEW

[AP]
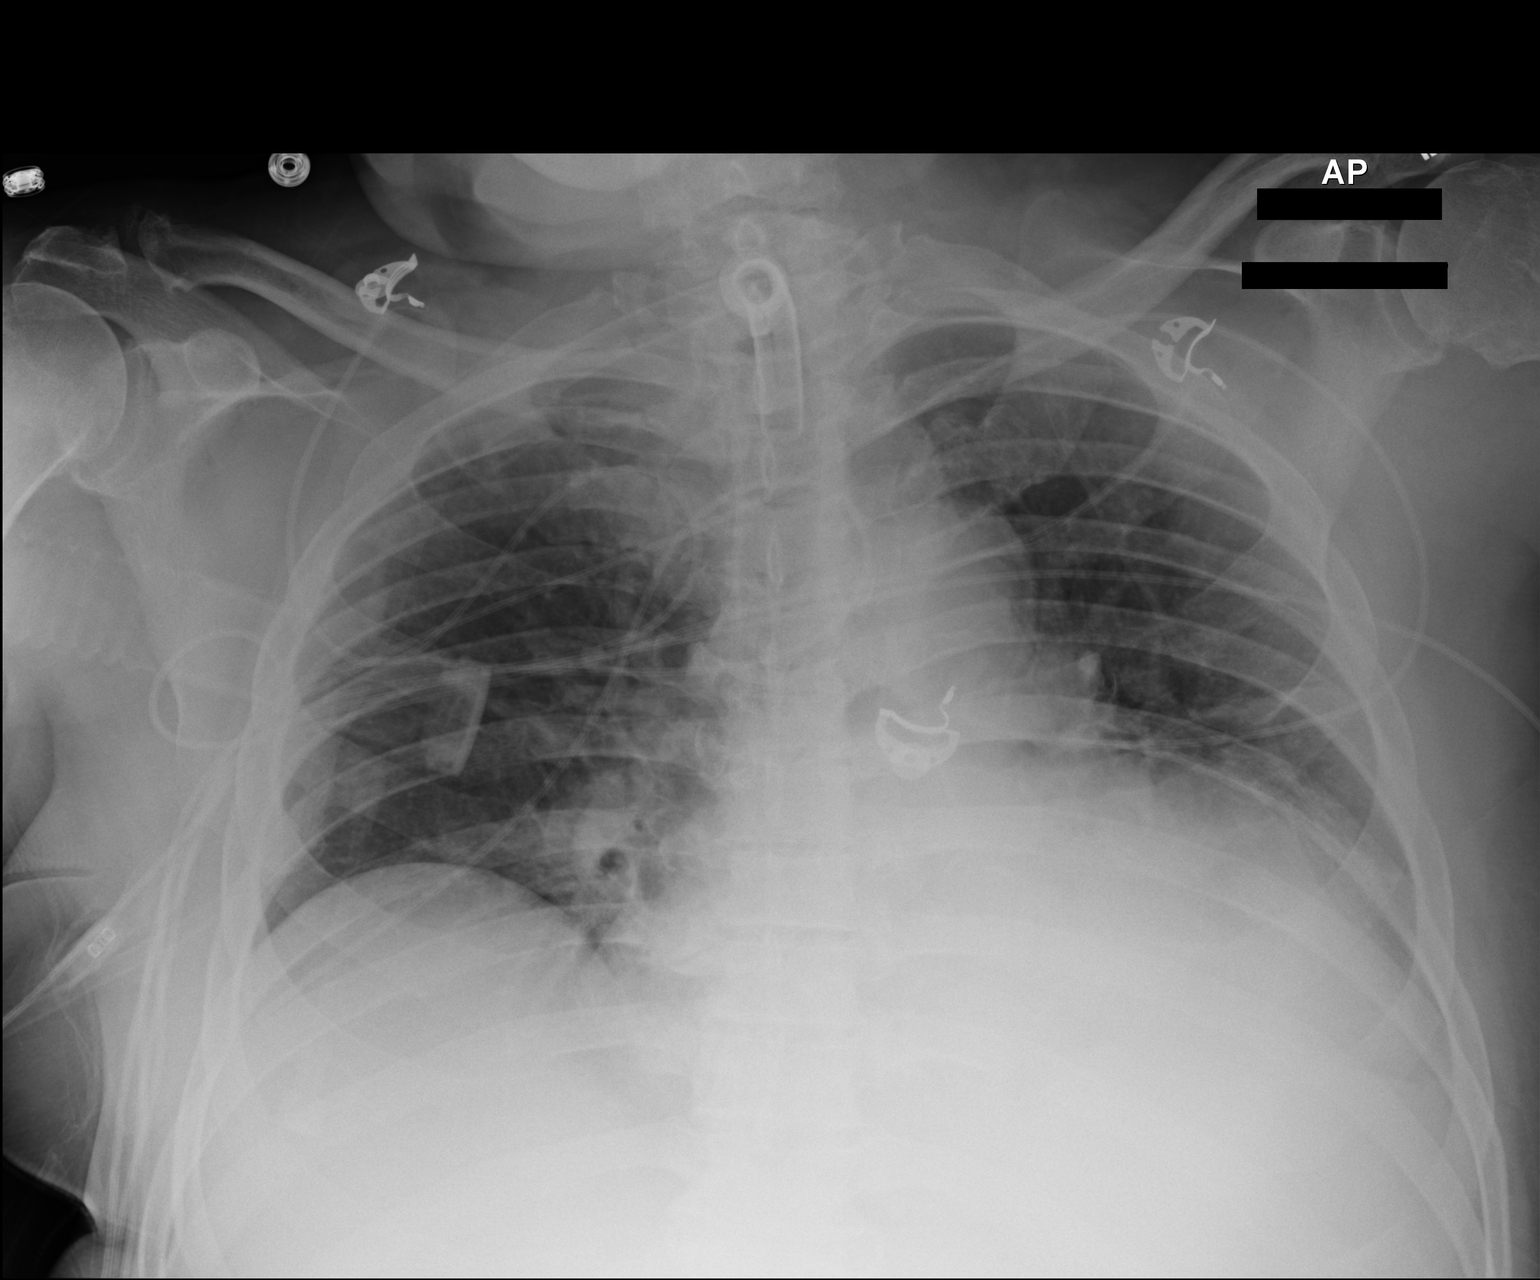

[1 of 1 positions shown; findings below may reference images not displayed]

FINDINGS: There is a tracheostomy tube with tip above the carina.

Persistent low lung volumes.  There is atelectasis within both lung
bases.
IMPRESSION: 1.  No change in low lung volumes with bibasilar atelectasis

## 2013-10-16 IMAGING — CR DG WRIST COMPLETE 3+V*R*
4 series · 4 of 4 positions shown · non-contrast
Comparison: Plain films 02/16/2012.

CLINICAL DATA: Swelling, right wrist swelling, tenderness and
redness.

RIGHT WRIST - COMPLETE 3+ VIEW

[x wrist pa right]
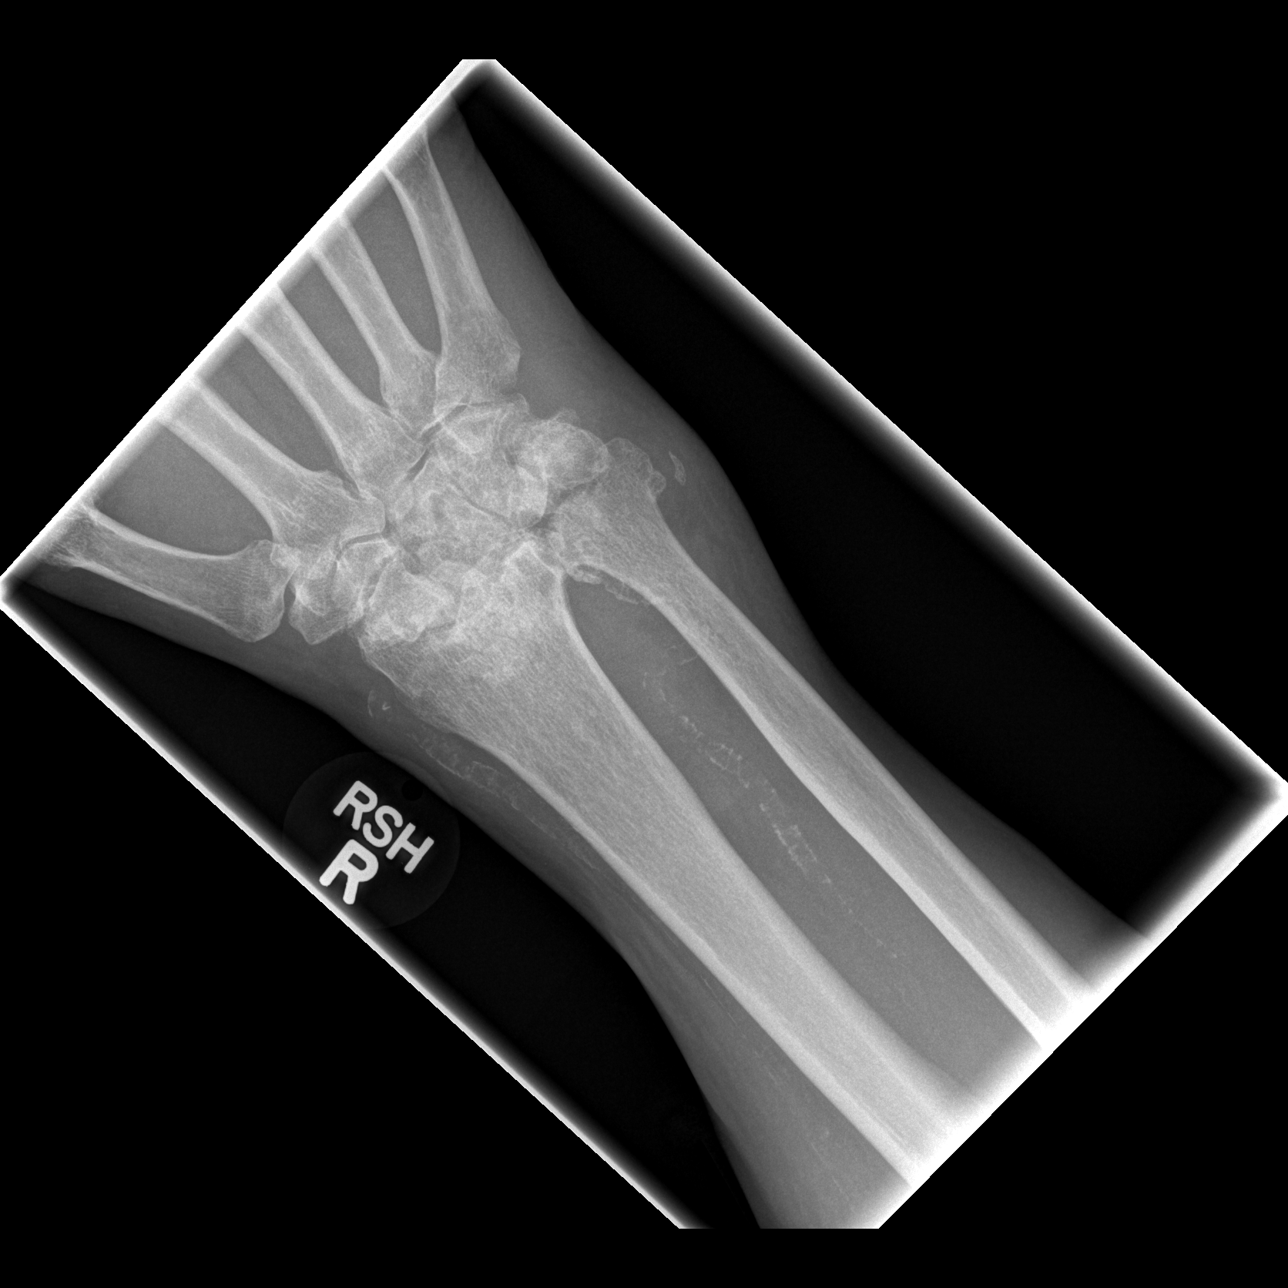

[x wrist obl right]
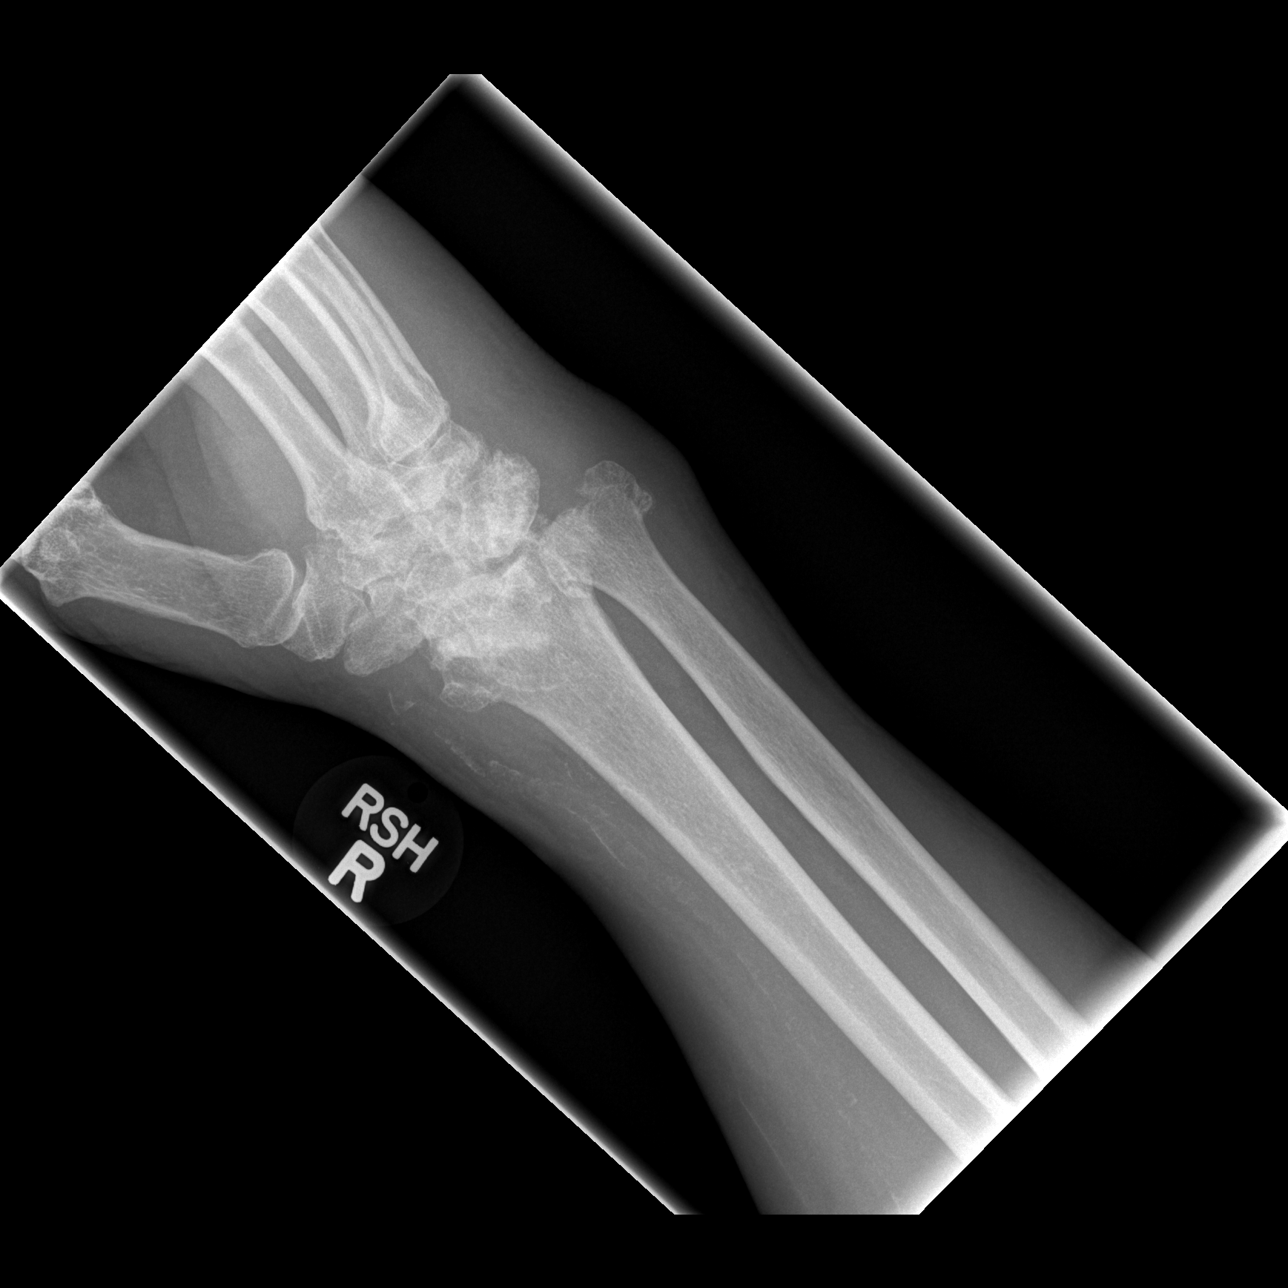

[x wrist lat right]
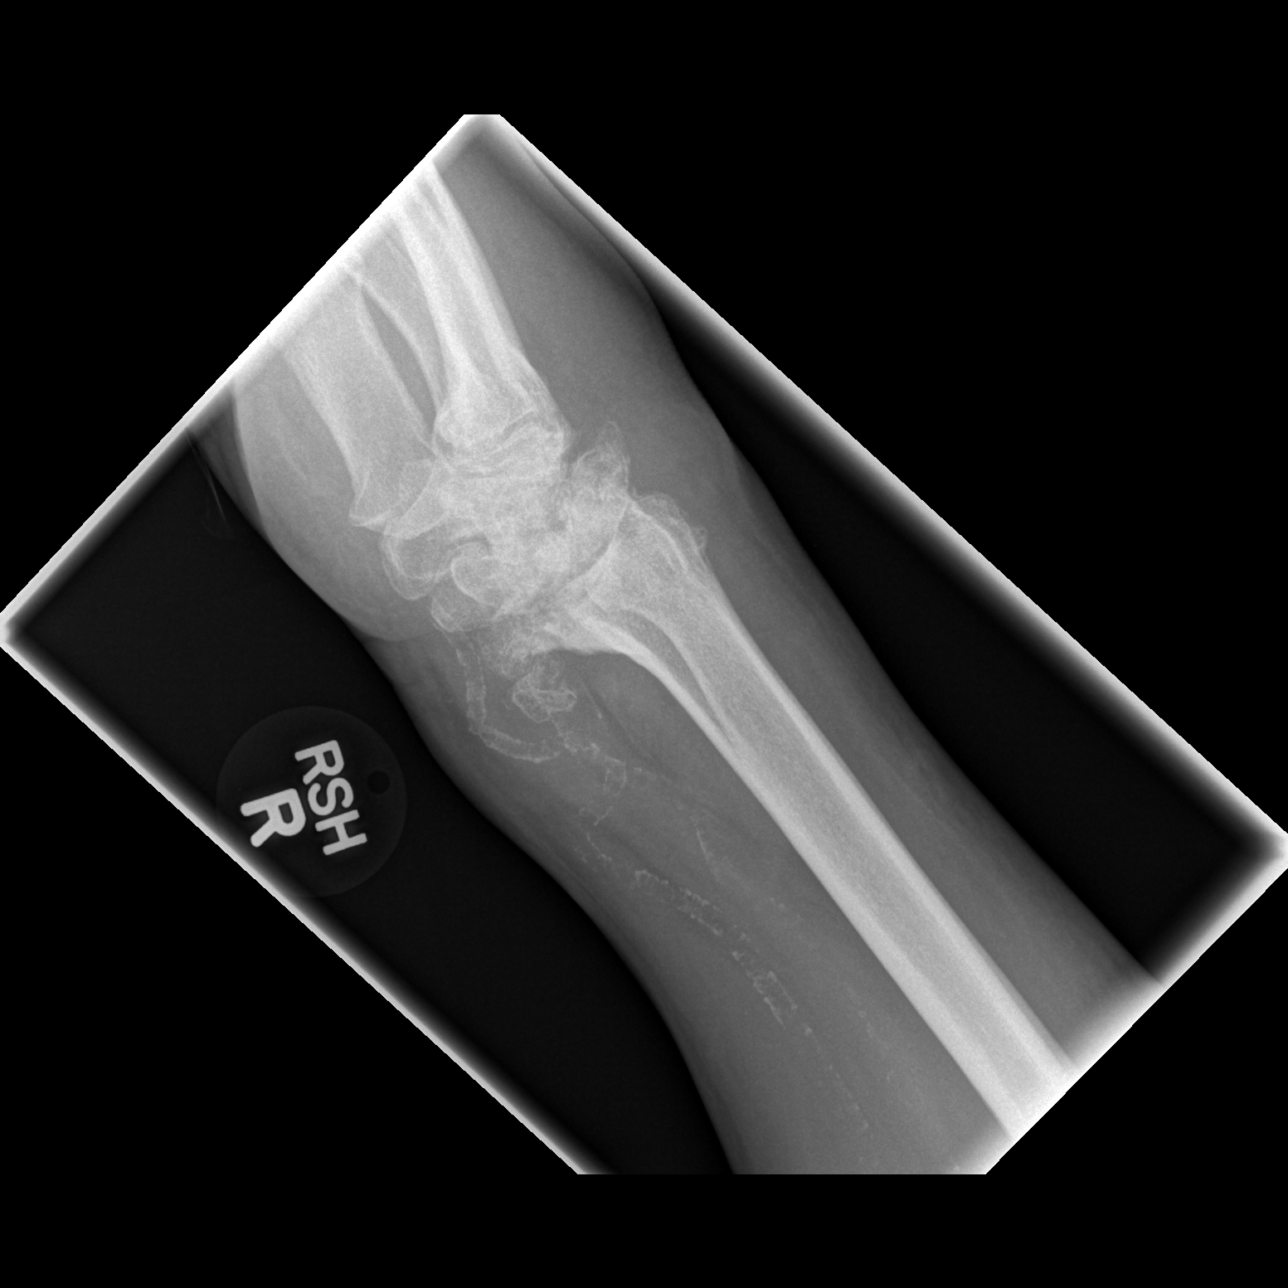

[x navicular]
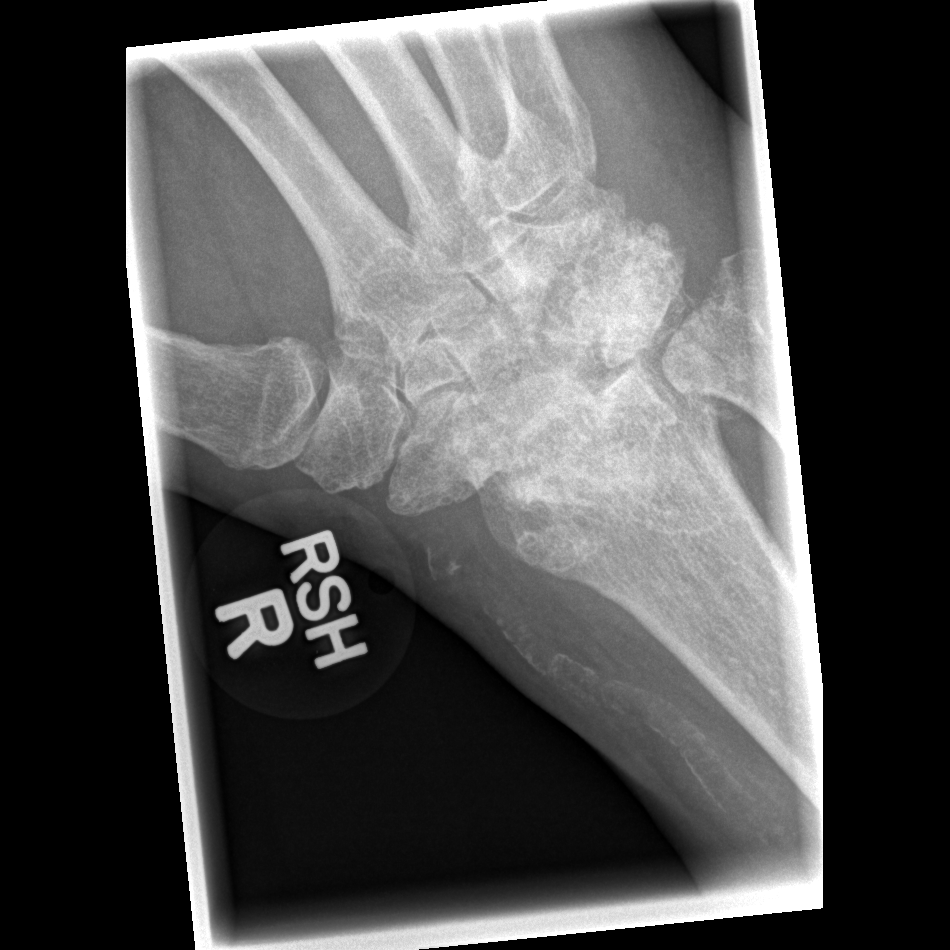

[4 of 4 positions shown; findings below may reference images not displayed]

FINDINGS: Again seen is carpal crowding with erosions in all of the
carpal bones, distal radius and distal ulna.  Soft tissues of the
wrist appear markedly swollen.  Atherosclerotic vascular disease is
noted.  No fracture is identified.
IMPRESSION: Findings as described above most suggestive of inflammatory
arthropathy such as rheumatoid.  Soft tissue swelling about the
wrists is suggestive of acute synovitis.

## 2013-11-17 ENCOUNTER — Other Ambulatory Visit (INDEPENDENT_AMBULATORY_CARE_PROVIDER_SITE_OTHER): Payer: Medicare HMO

## 2013-11-17 DIAGNOSIS — E1129 Type 2 diabetes mellitus with other diabetic kidney complication: Secondary | ICD-10-CM

## 2013-11-17 DIAGNOSIS — E1165 Type 2 diabetes mellitus with hyperglycemia: Principal | ICD-10-CM

## 2013-11-17 LAB — BASIC METABOLIC PANEL
BUN: 30 mg/dL — ABNORMAL HIGH (ref 6–23)
CO2: 26 meq/L (ref 19–32)
Calcium: 9.8 mg/dL (ref 8.4–10.5)
Chloride: 110 mEq/L (ref 96–112)
Creatinine, Ser: 1.9 mg/dL — ABNORMAL HIGH (ref 0.4–1.5)
GFR: 46.46 mL/min — ABNORMAL LOW (ref >60.00)
Glucose, Bld: 158 mg/dL — ABNORMAL HIGH (ref 70–99)
POTASSIUM: 4 meq/L (ref 3.5–5.1)
SODIUM: 141 meq/L (ref 135–145)

## 2013-11-19 LAB — FRUCTOSAMINE: Fructosamine: 359 umol/L — ABNORMAL HIGH (ref 190–270)

## 2013-11-21 ENCOUNTER — Ambulatory Visit: Payer: Medicare HMO | Admitting: *Deleted

## 2013-11-21 ENCOUNTER — Ambulatory Visit: Payer: Medicare HMO | Admitting: Endocrinology

## 2013-11-28 ENCOUNTER — Other Ambulatory Visit: Payer: Self-pay

## 2013-11-28 MED ORDER — CLONIDINE HCL 0.1 MG PO TABS: ORAL_TABLET | ORAL | Status: DC

## 2013-12-04 ENCOUNTER — Other Ambulatory Visit: Payer: Self-pay

## 2013-12-04 MED ORDER — AMLODIPINE BESYLATE 10 MG PO TABS: ORAL_TABLET | ORAL | Status: DC

## 2013-12-04 MED ORDER — METOPROLOL SUCCINATE ER 100 MG PO TB24: ORAL_TABLET | ORAL | Status: DC

## 2013-12-04 MED ORDER — ISOSORBIDE MONONITRATE ER 60 MG PO TB24: ORAL_TABLET | ORAL | Status: DC

## 2013-12-24 ENCOUNTER — Ambulatory Visit (INDEPENDENT_AMBULATORY_CARE_PROVIDER_SITE_OTHER): Payer: Medicare HMO | Admitting: Endocrinology

## 2013-12-24 ENCOUNTER — Encounter: Payer: Self-pay | Admitting: Endocrinology

## 2013-12-24 VITALS — BP 132/71 | HR 79 | Temp 98.3°F | Resp 16 | Ht 66.0 in | Wt 227.2 lb

## 2013-12-24 DIAGNOSIS — E1129 Type 2 diabetes mellitus with other diabetic kidney complication: Secondary | ICD-10-CM

## 2013-12-24 DIAGNOSIS — E1165 Type 2 diabetes mellitus with hyperglycemia: Principal | ICD-10-CM

## 2013-12-24 NOTE — Progress Notes (Signed)
Patient ID: Corey [name], male   DOB: 12-15-1945, 68 y.o.   MRN: [id number]   Reason for Appointment : Followup of Type 2 Diabetes  History of Present Illness          Diagnosis: Type 2 diabetes mellitus, date of diagnosis: 1997      Past history: The patient was initially treated with various oral hypoglycemic drugs and details are not available. About 2009 he was started on insulin and has either taken premixed insulin or Lantus once a day He had difficulty affording his Lantus insulin and prefers to take premixed insulin  Recent history:   He has been  on Victoza in addition to his insulin since 06/2013 He was having difficulty with progressive weight gain prior to this However his blood sugar control is still suboptimal as reflected by an A1c and fructosamine results  On his last visit he had gained 8 pounds despite continuing to be compliant with Victoza 1.2 mg For this reason his Victoza was increased to 1.8 mg He was supposed to take 8 units of insulin in the morning and 14 in the evening but is still doing 12 units twice a day Current blood sugar patterns:  Not checking fasting readings much recently and these appear to be somewhat increased  Blood sugars are in the 140s midday  Sugars are relatively higher in the afternoon, as high as 256  Blood sugar right before supper time are fairly good and after supper are usually no higher than 168  He had one unusual reading of over 500 last month but did not repeat the test and it was 180 the next morning  INSULIN regimen is described as:  12 UNITS Humalog mix bid    Glucose monitoring:  done 0.6 times a day         Glucometer:  Accu-Chek      Blood Glucose readings recently:   PREMEAL Breakfast Lunch Dinner Bedtime Overall  Glucose range:  142-180   147, 142      Mean/median:      155    POST-MEAL PC Breakfast PC Lunch PC Dinner  Glucose range:   157-256   128-168   Mean/median:      Hypoglycemia frequency: none .            Self-care:       Meals: 3 meals per day. Egg in am, sometimes sandwich at lunch. His largest meal is at suppertime at about 7 pm. Large portions at times Physical activity: exercise: Walking 30-40 minutes; 3/7  days        Dietician visit: Most recent: Pending               Oral hypoglycemic drugs the patient is taking are:  none        Compliance with the medical regimen:  good  Retinal exam: Most recent: about a year ago .    Wt Readings from Last 3 Encounters:  12/24/13 227 lb 3.2 oz (103.057 kg)  09/22/13 229 lb 12.8 oz (104.237 kg)  07/23/13 221 lb 12.8 oz (100.608 kg)    Lab Results  Component Value Date   HGBA1C 8.4* 09/19/2013   HGBA1C 9.2* 07/21/2013   HGBA1C 6.5 03/06/2013   Lab Results  Component Value Date   MICROALBUR 1.9 03/06/2013   LDLCALC 53 07/21/2013   CREATININE 1.9* 11/17/2013       Medication List       This list is accurate as of:  12/24/13  2:09 PM.  Always use your most recent med list.               amLODipine 10 MG tablet  Commonly known as:  NORVASC  TAKE ONE TABLET BY MOUTH ONCE DAILY     cloNIDine 0.1 MG tablet  Commonly known as:  CATAPRES  TAKE ONE TABLET BY MOUTH ONCE DAILY     glucose blood test strip  Commonly known as:  ACCU-CHEK AVIVA PLUS  Use as instructed to check blood sugars 2 times per day dx code 250.42     Insulin Lispro Prot & Lispro (75-25) 100 UNIT/ML Kwikpen  Commonly known as:  HUMALOG 75/25 MIX  12 Units. Inject 12 units twice a day     isosorbide mononitrate 60 MG 24 hr tablet  Commonly known as:  IMDUR  TAKE ONE TABLET BY MOUTH TWICE DAILY     Liraglutide 18 MG/3ML Sopn  Inject 1.2 mg into the skin daily.     metoprolol succinate 100 MG 24 hr tablet  Commonly known as:  TOPROL-XL  TAKE ONE TABLET BY MOUTH TWICE DAILY TAKE  WITH   OR  IMMEDIATELY  FOLLOWING A MEAL        Allergies:  Allergies  Allergen Reactions  . Hydrochlorothiazide     Gout , uncontrolled diabetes and renal insufficiency  .  Allopurinol     Renal insufficiency    Past Medical History  Diagnosis Date  . Renal insufficiency   . Diabetes mellitus   . Hypertension   . Gout   . Colon polyp 2003    Dr [name]; F/U was to be 2008( not completed)  . Diverticulosis 2003  . Pneumonia 09/29/11    Avelox X 10 days as OP    Past Surgical History  Procedure Laterality Date  . Refractive surgery  2005  . Colonoscopy w/ polypectomy  2003    no F/U ([place] discussed 12/03/12)  . Wrist aspiration  02/16/12     monosodium urate crystals; Dr Caralyn Guile    Family History  Problem Relation Age of Onset  . Hypertension Father   . Prostate cancer Father   . Diabetes Father   . Hypertension Mother   . Diabetes Mother   . Stroke Neg Hx   . Heart disease Neg Hx     Social History:  reports that he quit smoking about 45 years ago. He has never used smokeless tobacco. He reports that he does not drink alcohol or use illicit drugs.    Review of Systems   Lipids: Only abnormality is low HDL, currently not on a statin drug  Lab Results  Component Value Date   CHOL 110 07/21/2013   HDL 46.90 07/21/2013   LDLCALC 53 07/21/2013   TRIG 49.0 07/21/2013   CHOLHDL 2 07/21/2013    Eye exam 1-2 years, ? Retinopathy    He has mild renal insufficiency of unclear etiology with variable creatinine levels  Lab Results  Component Value Date   CREATININE 1.9* 11/17/2013   HYPERTENSION: He has not followed up with his PCP . Because of higher readings his clonidine was increased to 3 times a day. No side effects from this.  Also taking Norvasc and metoprolol 100 mg   Physical Examination:  BP 132/71  Pulse 79  Temp(Src) 98.3 F (36.8 C)  Resp 16  Ht 5\' 6"  (1.676 m)  Wt 227 lb 3.2 oz (103.057 kg)  BMI 36.69 kg/m2  SpO2 96%  ASSESSMENT/PLAN:  Diabetes type 2, uncontrolled   Blood sugars are again overall high despite using Victoza 1.8 mg and premixed insulin Fructosamine last month was 359, higher than before However  his weight is leveling off with increasing the Victoza Most of his high readings appear to be in the early afternoon and possibly overnight Checking blood sugars mostly before and after supper and difficult to get a full pattern He has not changed his insulin as directed and most likely will not be able to comply with a basal bolus injection regimen with 4 injections a day He also can do better with diet and needs to see the dietitian, this is scheduled Also discussed when to check his blood sugar  Recommendation made today: Victoza 1.8 mg daily, Insulin 15 units in the morning and 12 at supper time Followup in 2 months  Corey [name] 12/24/2013, 2:09 PM

## 2013-12-24 NOTE — Patient Instructions (Signed)
Increase insulin to 14 units twice a day  Please check blood sugars at least half the time about 2 hours after any meal and twice a week on waking up. Please bring blood sugar monitor to each visit  Walk regularly

## 2013-12-31 ENCOUNTER — Ambulatory Visit: Payer: Medicare HMO | Admitting: *Deleted

## 2014-01-21 ENCOUNTER — Other Ambulatory Visit: Payer: Commercial Managed Care - HMO

## 2014-02-19 ENCOUNTER — Other Ambulatory Visit (INDEPENDENT_AMBULATORY_CARE_PROVIDER_SITE_OTHER): Payer: Commercial Managed Care - HMO

## 2014-02-19 DIAGNOSIS — E1165 Type 2 diabetes mellitus with hyperglycemia: Secondary | ICD-10-CM

## 2014-02-19 DIAGNOSIS — E1129 Type 2 diabetes mellitus with other diabetic kidney complication: Secondary | ICD-10-CM

## 2014-02-19 DIAGNOSIS — IMO0002 Reserved for concepts with insufficient information to code with codable children: Secondary | ICD-10-CM

## 2014-02-19 LAB — COMPREHENSIVE METABOLIC PANEL
ALK PHOS: 51 U/L (ref 39–117)
ALT: 18 U/L (ref 0–53)
AST: 20 U/L (ref 0–37)
Albumin: 3.6 g/dL (ref 3.5–5.2)
BILIRUBIN TOTAL: 0.3 mg/dL (ref 0.2–1.2)
BUN: 27 mg/dL — ABNORMAL HIGH (ref 6–23)
CO2: 22 mEq/L (ref 19–32)
Calcium: 9.5 mg/dL (ref 8.4–10.5)
Chloride: 111 mEq/L (ref 96–112)
Creatinine, Ser: 1.8 mg/dL — ABNORMAL HIGH (ref 0.4–1.5)
GFR: 50.1 mL/min — ABNORMAL LOW (ref >60.00)
Glucose, Bld: 211 mg/dL — ABNORMAL HIGH (ref 70–99)
POTASSIUM: 3.9 meq/L (ref 3.5–5.1)
Sodium: 141 mEq/L (ref 135–145)
TOTAL PROTEIN: 7.7 g/dL (ref 6.0–8.3)

## 2014-02-19 LAB — HEMOGLOBIN A1C: HEMOGLOBIN A1C: 9 % — AB (ref 4.6–6.5)

## 2014-02-23 ENCOUNTER — Encounter: Payer: Self-pay | Admitting: Endocrinology

## 2014-02-23 ENCOUNTER — Ambulatory Visit (INDEPENDENT_AMBULATORY_CARE_PROVIDER_SITE_OTHER): Payer: Commercial Managed Care - HMO | Admitting: Endocrinology

## 2014-02-23 VITALS — BP 162/96 | HR 60 | Temp 98.0°F | Resp 14 | Ht 66.0 in | Wt 225.4 lb

## 2014-02-23 DIAGNOSIS — E1165 Type 2 diabetes mellitus with hyperglycemia: Secondary | ICD-10-CM

## 2014-02-23 DIAGNOSIS — IMO0002 Reserved for concepts with insufficient information to code with codable children: Secondary | ICD-10-CM

## 2014-02-23 DIAGNOSIS — Z23 Encounter for immunization: Secondary | ICD-10-CM

## 2014-02-23 NOTE — Patient Instructions (Addendum)
Walk 5 days a week  Insulin 15 units twice a day  Please check blood sugars at least half the time about 2 hours after any meal and 3 times per week on waking up. Please bring blood sugar monitor to each visit

## 2014-02-23 NOTE — Progress Notes (Signed)
Patient ID: Corey [name], male   DOB: Mar 31, 1946, 68 y.o.   MRN: [id number]   Reason for Appointment : Followup of Type 2 Diabetes  History of Present Illness          Diagnosis: Type 2 diabetes mellitus, date of diagnosis: 1997      Past history: The patient was initially treated with various oral hypoglycemic drugs and details are not available. About 2009 he was started on insulin and has either taken premixed insulin or Lantus once a day He had difficulty affording his Lantus insulin and prefers to take premixed insulin  Recent history:   He has been  on Victoza in addition to his insulin since 06/2013 and his previous tendency to weight gain has improved He also is gradually losing some weight However his A1c is now 9% He was told to increase his insulin at least in the morning 15 units but he still taking 12 units Also did not bring his monitor for review today and not clear when his blood sugars are high; most likely these are variable based on his compliance with diet. His glucose in the lab was 211 in the morning Hypoglycemia : none    INSULIN regimen is described as:  12 UNITS Humalog mix bid    Glucose monitoring:  done less than one time a day         Glucometer:  Accu-Chek      Blood Glucose readings 125 -200 at various times, does not remember when his blood sugars are high        Self-care:       Meals: 3 meals per day. Egg in am, sometimes sandwich at lunch. His largest meal is at suppertime at about 7 pm.  Large portions at times Physical activity: exercise: Walking 30-40 minutes; 3/7  days        Dietician visit: Most recent: Pending               Oral hypoglycemic drugs the patient is taking are:  none        Compliance with the medical regimen:  Fair  Retinal exam: Most recent: about a year ago .    Wt Readings from Last 3 Encounters:  02/23/14 225 lb 6.4 oz (102.241 kg)  12/24/13 227 lb 3.2 oz (103.057 kg)  09/22/13 229 lb 12.8 oz (104.237 kg)    Lab  Results  Component Value Date   HGBA1C 9.0* 02/19/2014   HGBA1C 8.4* 09/19/2013   HGBA1C 9.2* 07/21/2013   Lab Results  Component Value Date   MICROALBUR 1.9 03/06/2013   LDLCALC 53 07/21/2013   CREATININE 1.8* 02/19/2014   Appointment on 02/19/2014  Component Date Value Ref Range Status  . Hemoglobin A1C 02/19/2014 9.0* 4.6 - 6.5 % Final   Glycemic Control Guidelines for People with Diabetes:Non Diabetic:  <6%Goal of Therapy: <7%Additional Action Suggested:  >8%   . Sodium 02/19/2014 141  135 - 145 mEq/L Final  . Potassium 02/19/2014 3.9  3.5 - 5.1 mEq/L Final  . Chloride 02/19/2014 111  96 - 112 mEq/L Final  . CO2 02/19/2014 22  19 - 32 mEq/L Final  . Glucose, Bld 02/19/2014 211* 70 - 99 mg/dL Final  . BUN 02/19/2014 27* 6 - 23 mg/dL Final  . Creatinine, Ser 02/19/2014 1.8* 0.4 - 1.5 mg/dL Final  . Total Bilirubin 02/19/2014 0.3  0.2 - 1.2 mg/dL Final  . Alkaline Phosphatase 02/19/2014 51  39 - 117 U/L Final  .  AST 02/19/2014 20  0 - 37 U/L Final  . ALT 02/19/2014 18  0 - 53 U/L Final  . Total Protein 02/19/2014 7.7  6.0 - 8.3 g/dL Final  . Albumin 02/19/2014 3.6  3.5 - 5.2 g/dL Final  . Calcium 02/19/2014 9.5  8.4 - 10.5 mg/dL Final  . GFR 02/19/2014 50.10* >60.00 mL/min Final       Medication List       This list is accurate as of: 02/23/14  3:29 PM.  Always use your most recent med list.               amLODipine 10 MG tablet  Commonly known as:  NORVASC  TAKE ONE TABLET BY MOUTH ONCE DAILY     cloNIDine 0.1 MG tablet  Commonly known as:  CATAPRES  TAKE ONE TABLET BY MOUTH ONCE DAILY     glucose blood test strip  Commonly known as:  ACCU-CHEK AVIVA PLUS  Use as instructed to check blood sugars 2 times per day dx code 250.42     Insulin Lispro Prot & Lispro (75-25) 100 UNIT/ML Kwikpen  Commonly known as:  HUMALOG 75/25 MIX  12 Units. Inject 12 units twice a day     isosorbide mononitrate 60 MG 24 hr tablet  Commonly known as:  IMDUR  TAKE ONE TABLET BY MOUTH  TWICE DAILY     Liraglutide 18 MG/3ML Sopn  Inject 1.2 mg into the skin daily.     metoprolol succinate 100 MG 24 hr tablet  Commonly known as:  TOPROL-XL  TAKE ONE TABLET BY MOUTH TWICE DAILY TAKE  WITH   OR  IMMEDIATELY  FOLLOWING A MEAL        Allergies:  Allergies  Allergen Reactions  . Hydrochlorothiazide     Gout , uncontrolled diabetes and renal insufficiency  . Allopurinol     Renal insufficiency    Past Medical History  Diagnosis Date  . Renal insufficiency   . Diabetes mellitus   . Hypertension   . Gout   . Colon polyp 2003    Dr [name]; F/U was to be 2008( not completed)  . Diverticulosis 2003  . Pneumonia 09/29/11    Avelox X 10 days as OP    Past Surgical History  Procedure Laterality Date  . Refractive surgery  2005  . Colonoscopy w/ polypectomy  2003    no F/U ([place] discussed 12/03/12)  . Wrist aspiration  02/16/12     monosodium urate crystals; Dr Caralyn Guile    Family History  Problem Relation Age of Onset  . Hypertension Father   . Prostate cancer Father   . Diabetes Father   . Hypertension Mother   . Diabetes Mother   . Stroke Neg Hx   . Heart disease Neg Hx     Social History:  reports that he quit smoking about 45 years ago. He has never used smokeless tobacco. He reports that he does not drink alcohol or use illicit drugs.    Review of Systems   Lipids: Normal and currently not on a statin drug  Lab Results  Component Value Date   CHOL 110 07/21/2013   HDL 46.90 07/21/2013   LDLCALC 53 07/21/2013   TRIG 49.0 07/21/2013   CHOLHDL 2 07/21/2013    Eye exams: Irregular    He has mild renal insufficiency of unclear etiology with variable creatinine levels  Lab Results  Component Value Date   CREATININE 1.8* 02/19/2014   HYPERTENSION:  He has not followed up with his PCP  He thinks he is taking all his medications as directed He thinks he is taking his clonidine 3 times a day without side effects.  Also taking Norvasc and metoprolol 100  mg   Physical Examination:  BP 162/96  Pulse 60  Temp(Src) 98 F (36.7 C)  Resp 14  Ht 5\' 6"  (1.676 m)  Wt 225 lb 6.4 oz (102.241 kg)  BMI 36.40 kg/m2  SpO2 93%    ASSESSMENT/PLAN:  Diabetes type 2, uncontrolled   Blood sugars are again overall high despite using Victoza 1.8 mg and premixed insulin He is clearly taking less insulin than was prescribed and since he did not bring his monitor for download not clear when his sugar is high  For now will increase both his morning and evening doses by 3 units and discussed needing to increase this further Discussed blood sugar targets and he can adjust his insulin up or down 2 units based on blood sugars before breakfast or supper if consistently high  Also he can do better with a walking program for exercise and this was emphasized; discussed need for weight loss He also can do better with diet and needs to see the dietitian, referral done again Also discussed when to check his blood sugar including some after meals   Patient Instructions  Walk 5 days a week  Insulin 15 units twice a day  Please check blood sugars at least half the time about 2 hours after any meal and 3 times per week on waking up. Please bring blood sugar monitor to each visit    Counseling time over 50% of today's 25 minute visit  Corey [name] 02/23/2014, 3:29 PM

## 2014-02-27 ENCOUNTER — Other Ambulatory Visit: Payer: Self-pay

## 2014-02-27 ENCOUNTER — Encounter: Payer: Self-pay | Admitting: Dietician

## 2014-02-27 ENCOUNTER — Encounter: Payer: Medicare HMO | Attending: Endocrinology | Admitting: Dietician

## 2014-02-27 VITALS — Ht 66.0 in | Wt 226.8 lb

## 2014-02-27 DIAGNOSIS — Z794 Long term (current) use of insulin: Secondary | ICD-10-CM | POA: Insufficient documentation

## 2014-02-27 DIAGNOSIS — Z713 Dietary counseling and surveillance: Secondary | ICD-10-CM | POA: Insufficient documentation

## 2014-02-27 DIAGNOSIS — E119 Type 2 diabetes mellitus without complications: Secondary | ICD-10-CM | POA: Diagnosis present

## 2014-02-27 NOTE — Progress Notes (Signed)
Diabetes Self-Management Education  Visit Type:  Initial  Appt. Start Time: 0815 Appt. End Time: [id number]  02/27/2014  [name], identified by name and date of birth, is a 68 y.o. male with a diagnosis of Diabetes: Type 2.  Other people present during visit:  Patient   ASSESSMENT  Height 5\' 6"  (1.676 m), weight 226 lb 12.8 oz (102.876 kg). Body mass index is 36.62 kg/(m^2).  Initial Visit Information:  Are you currently following a meal plan?: No   Are you taking your medications as prescribed?: Yes Are you checking your feet?: Yes How many days per week are you checking your feet?: 2      Psychosocial:     Patient Belief/Attitude about Diabetes: Motivated to manage diabetes Self-management support: Doctor's office Other persons present: Patient Patient Concerns: Weight Control;Nutrition/Meal planning Special Needs: None Preferred Learning Style: No preference indicated Learning Readiness: Ready  Complications:   Last HgB A1C per patient/outside source: 9% How often do you check your blood sugar?: 1-2 times/day Fasting Blood glucose range (mg/dL): 130-179 Postprandial Blood glucose range (mg/dL):  (varies) Number of hypoglycemic episodes per month: 0 Number of hyperglycemic episodes per week: 2 Have you had a dilated eye exam in the past 12 months?: No Have you had a dental exam in the past 12 months?: No  Diet Intake:  Breakfast: 8am egg or cereal, sometimes coffee, sometimes milk Lunch: 12 - peanut butter crackers, sometimes grilled cheese Dinner: 7-8 pm white rice with gravy, cube steak, soda Snack (evening): sometimes ice cream Beverage(s): milk, coffee, water, soda, sometimes juice  Exercise:  Exercise: Light (walking / raking leaves) Light Exercise amount of time (min / week): 120  Individualized Plan for Diabetes Self-Management Training:   Learning Objective:  Patient will have a greater understanding of diabetes self-management.  Patient  education plan per assessed needs and concerns is to attend individual sessions for  weight and blood sugar management.   Education Topics Reviewed with Patient Today:  Definition of diabetes, type 1 and 2, and the diagnosis of diabetes;Factors that contribute to the development of diabetes Role of diet in the treatment of diabetes and the relationship between the three main macronutrients and blood glucose level;Carbohydrate counting Role of exercise on diabetes management, blood pressure control and cardiac health.   Identified appropriate SMBG and/or A1C goals.   Relationship between chronic complications and blood glucose control        PATIENTS GOALS/Plan (Developed by the patient):  Nutrition: General guidelines for healthy choices and portions discussed Physical Activity: Exercise 3-5 times per week;30 minutes per day   Expected Outcomes:  Demonstrated interest in learning. Expect positive outcomes  Education material provided: Living Well with Diabetes, Meal plan card and My Plate  If problems or questions, patient to contact team via:  Phone  Future DSME appointment: 3-4 months

## 2014-04-06 ENCOUNTER — Other Ambulatory Visit: Payer: Self-pay | Admitting: Internal Medicine

## 2014-04-20 ENCOUNTER — Other Ambulatory Visit (INDEPENDENT_AMBULATORY_CARE_PROVIDER_SITE_OTHER): Payer: Commercial Managed Care - HMO

## 2014-04-20 ENCOUNTER — Other Ambulatory Visit: Payer: Self-pay | Admitting: *Deleted

## 2014-04-20 DIAGNOSIS — E1165 Type 2 diabetes mellitus with hyperglycemia: Principal | ICD-10-CM

## 2014-04-20 DIAGNOSIS — IMO0002 Reserved for concepts with insufficient information to code with codable children: Secondary | ICD-10-CM

## 2014-04-20 DIAGNOSIS — E1129 Type 2 diabetes mellitus with other diabetic kidney complication: Secondary | ICD-10-CM

## 2014-04-20 LAB — COMPREHENSIVE METABOLIC PANEL
ALT: 61 U/L — ABNORMAL HIGH (ref 0–53)
AST: 31 U/L (ref 0–37)
Albumin: 3.9 g/dL (ref 3.5–5.2)
Alkaline Phosphatase: 98 U/L (ref 39–117)
BUN: 20 mg/dL (ref 6–23)
CO2: 21 mEq/L (ref 19–32)
Calcium: 9.5 mg/dL (ref 8.4–10.5)
Chloride: 114 mEq/L — ABNORMAL HIGH (ref 96–112)
Creatinine, Ser: 1.5 mg/dL (ref 0.4–1.5)
GFR: 62.62 mL/min (ref >60.00)
Glucose, Bld: 155 mg/dL — ABNORMAL HIGH (ref 70–99)
Potassium: 3.7 mEq/L (ref 3.5–5.1)
Sodium: 142 mEq/L (ref 135–145)
Total Bilirubin: 0.5 mg/dL (ref 0.2–1.2)
Total Protein: 7.1 g/dL (ref 6.0–8.3)

## 2014-04-20 LAB — LIPID PANEL
Cholesterol: 113 mg/dL (ref 0–200)
HDL: 38.5 mg/dL — ABNORMAL LOW (ref >39.00)
LDL Cholesterol: 62 mg/dL (ref 0–99)
NONHDL: 74.5
TRIGLYCERIDES: 64 mg/dL (ref 0.0–149.0)
Total CHOL/HDL Ratio: 3
VLDL: 12.8 mg/dL (ref 0.0–40.0)

## 2014-04-22 ENCOUNTER — Encounter: Payer: Self-pay | Admitting: Endocrinology

## 2014-04-22 ENCOUNTER — Ambulatory Visit (INDEPENDENT_AMBULATORY_CARE_PROVIDER_SITE_OTHER): Payer: Commercial Managed Care - HMO | Admitting: Endocrinology

## 2014-04-22 VITALS — BP 162/82 | HR 55 | Temp 97.9°F | Resp 14 | Ht 66.0 in | Wt 224.4 lb

## 2014-04-22 DIAGNOSIS — I10 Essential (primary) hypertension: Secondary | ICD-10-CM

## 2014-04-22 DIAGNOSIS — E1165 Type 2 diabetes mellitus with hyperglycemia: Secondary | ICD-10-CM

## 2014-04-22 DIAGNOSIS — IMO0002 Reserved for concepts with insufficient information to code with codable children: Secondary | ICD-10-CM

## 2014-04-22 LAB — FRUCTOSAMINE: Fructosamine: 372 umol/L — ABNORMAL HIGH (ref 190–270)

## 2014-04-22 NOTE — Patient Instructions (Addendum)
If eating lunch (sandwich) then 8 units before eating lunch  Stay on 15 units at [place] and lunch

## 2014-04-22 NOTE — Progress Notes (Signed)
Patient ID: Corey [name], male   DOB: 1945/06/28, 68 y.o.   MRN: [id number]   Reason for Appointment : Followup of Type 2 Diabetes  History of Present Illness          Diagnosis: Type 2 diabetes mellitus, date of diagnosis: 1997      Past history: The patient was initially treated with various oral hypoglycemic drugs and details are not available. About 2009 he was started on insulin and has either taken premixed insulin or Lantus once a day He had difficulty affording his Lantus insulin and prefers to take premixed insulin  Recent history:   He had been on Victoza in addition to his insulin since 06/2013 which had helped with his weight However he cannot afford this anymore and has stopped taking this for about a month His insulin requirement has increased overall and he is taking 15 units of premixed insulin twice a day No recent A1c available, previously 9%; his monitor was not reviewed on his previous visits and was having variable readings by history Current blood sugar patterns and problems:  His blood sugars are averaging between 150-170 both morning and evening although checking mostly in the evening.    Not clear which readings in the evenings are before and which are after meals.  He has variability in his blood sugars at that time and recently they have been mostly high; he thinks recently high sugars are before eating dinner  He is not getting insulin coverage for his lunch and blood sugars may be higher when he is eating a meal like a sandwich.  He does have a high reading in the afternoon checked last month  He has not checked many readings after her evening meal and not clear if he is getting adequate control especially with stopping Victoza  Although he is exercising fairly regularly he has not lost any significant amount of weight  Hypoglycemia : none    INSULIN regimen is described as:  15 UNITS Humalog mix bid    Glucose monitoring:  done less than one time a day          Glucometer:  Accu-Chek      Blood Glucose readings   PRE-MEAL Breakfast  afternoon  Dinner Bedtime  average   Glucose range:  151-167   172, 176   96-243  ?    169    Self-care:       Meals: 3 meals per day. Egg in am at 10 am , sometimes sandwich at lunch. His largest meal is at suppertime at about 8 pm.  Physical activity: exercise: Walking 30-40 minutes; 6/7  days, usually 7 am       Dietician visit: Most recent: 02/2014               Oral hypoglycemic drugs the patient is taking are:  none        Compliance with the medical regimen:  Fair  Retinal exam: Most recent: about a year ago .    Wt Readings from Last 3 Encounters:  04/22/14 224 lb 6.4 oz (101.787 kg)  02/27/14 226 lb 12.8 oz (102.876 kg)  02/23/14 225 lb 6.4 oz (102.241 kg)    Lab Results  Component Value Date   HGBA1C 9.0* 02/19/2014   HGBA1C 8.4* 09/19/2013   HGBA1C 9.2* 07/21/2013   Lab Results  Component Value Date   MICROALBUR 1.9 03/06/2013   LDLCALC 62 04/20/2014   CREATININE 1.5 04/20/2014   Appointment on  04/20/2014  Component Date Value Ref Range Status  . Fructosamine 04/20/2014 372* 190 - 270 umol/L Final  . Sodium 04/20/2014 142  135 - 145 mEq/L Final  . Potassium 04/20/2014 3.7  3.5 - 5.1 mEq/L Final  . Chloride 04/20/2014 114* 96 - 112 mEq/L Final  . CO2 04/20/2014 21  19 - 32 mEq/L Final  . Glucose, Bld 04/20/2014 155* 70 - 99 mg/dL Final  . BUN 04/20/2014 20  6 - 23 mg/dL Final  . Creatinine, Ser 04/20/2014 1.5  0.4 - 1.5 mg/dL Final  . Total Bilirubin 04/20/2014 0.5  0.2 - 1.2 mg/dL Final  . Alkaline Phosphatase 04/20/2014 98  39 - 117 U/L Final  . AST 04/20/2014 31  0 - 37 U/L Final  . ALT 04/20/2014 61* 0 - 53 U/L Final  . Total Protein 04/20/2014 7.1  6.0 - 8.3 g/dL Final  . Albumin 04/20/2014 3.9  3.5 - 5.2 g/dL Final  . Calcium 04/20/2014 9.5  8.4 - 10.5 mg/dL Final  . GFR 04/20/2014 62.62  >60.00 mL/min Final  . Cholesterol 04/20/2014 113  0 - 200 mg/dL Final   ATP III  Classification       Desirable:  < 200 mg/dL               Borderline High:  200 - 239 mg/dL          High:  > = 240 mg/dL  . Triglycerides 04/20/2014 64.0  0.0 - 149.0 mg/dL Final   Normal:  <150 mg/dLBorderline High:  150 - 199 mg/dL  . HDL 04/20/2014 38.50* >39.00 mg/dL Final  . VLDL 04/20/2014 12.8  0.0 - 40.0 mg/dL Final  . LDL Cholesterol 04/20/2014 62  0 - 99 mg/dL Final  . Total CHOL/HDL Ratio 04/20/2014 3   Final                  Men          Women1/2 Average Risk     3.4          3.3Average Risk          5.0          4.42X Average Risk          9.6          7.13X Average Risk          15.0          11.0                      . NonHDL 04/20/2014 74.50   Final   NOTE:  Non-HDL goal should be 30 mg/dL higher than patient's LDL goal (i.e. LDL goal of < 70 mg/dL, would have non-HDL goal of < 100 mg/dL)       Medication List       This list is accurate as of: 04/22/14  5:10 PM.  Always use your most recent med list.               amLODipine 10 MG tablet  Commonly known as:  NORVASC  TAKE 1 TABLET EVERY DAY     cloNIDine 0.1 MG tablet  Commonly known as:  CATAPRES  TAKE ONE TABLET BY MOUTH ONCE DAILY     glucose blood test strip  Commonly known as:  ACCU-CHEK AVIVA PLUS  Use as instructed to check blood sugars 2 times per day dx code 250.42     Insulin Lispro  Prot & Lispro (75-25) 100 UNIT/ML Kwikpen  Commonly known as:  HUMALOG 75/25 MIX  12 Units. Inject 12 units twice a day     isosorbide mononitrate 60 MG 24 hr tablet  Commonly known as:  IMDUR  TAKE 1 TABLET TWICE DAILY  ( NEED MD APPOINTMENT  FOR  FURTHER  REFILLS  )     Liraglutide 18 MG/3ML Sopn  Inject 1.2 mg into the skin daily.     metoprolol succinate 100 MG 24 hr tablet  Commonly known as:  TOPROL-XL  TAKE 1 TABLET TWICE DAILY  WITH  OR  IMMEDIATELY  FOLLOWING A MEAL (NEED MD APPOINTMENT FOR REFILLS)        Allergies:  Allergies  Allergen Reactions  . Hydrochlorothiazide     Gout , uncontrolled  diabetes and renal insufficiency  . Allopurinol     Renal insufficiency    Past Medical History  Diagnosis Date  . Renal insufficiency   . Diabetes mellitus   . Hypertension   . Gout   . Colon polyp 2003    Dr [name]; F/U was to be 2008( not completed)  . Diverticulosis 2003  . Pneumonia 09/29/11    Avelox X 10 days as OP    Past Surgical History  Procedure Laterality Date  . Refractive surgery  2005  . Colonoscopy w/ polypectomy  2003    no F/U ([place] discussed 12/03/12)  . Wrist aspiration  02/16/12     monosodium urate crystals; Dr Caralyn Guile    Family History  Problem Relation Age of Onset  . Hypertension Father   . Prostate cancer Father   . Diabetes Father   . Hypertension Mother   . Diabetes Mother   . Stroke Neg Hx   . Heart disease Neg Hx     Social History:  reports that he quit smoking about 45 years ago. He has never used smokeless tobacco. He reports that he does not drink alcohol or use illicit drugs.    Review of Systems   Lipids: Normal and currently not on a statin drug  Lab Results  Component Value Date   CHOL 113 04/20/2014   HDL 38.50* 04/20/2014   LDLCALC 62 04/20/2014   TRIG 64.0 04/20/2014   CHOLHDL 3 04/20/2014    Eye exams: Irregular    He has mild renal insufficiency of unclear etiology with variable creatinine levels, now near normal   Lab Results  Component Value Date   CREATININE 1.5 04/20/2014   HYPERTENSION: He has not followed up with his PCP  again as directed for blood pressure management  He thinks he is taking all his medications as directed He thinks he is taking his clonidine 3 times a day without side effects.  Also taking Norvasc and metoprolol 100 mg   Physical Examination:  BP 162/82 mmHg  Pulse 55  Temp(Src) 97.9 F (36.6 C)  Resp 14  Ht 5\' 6"  (1.676 m)  Wt 224 lb 6.4 oz (101.787 kg)  BMI 36.24 kg/m2  SpO2 94%   no pedal edema   ASSESSMENT/PLAN:  Diabetes type 2, uncontrolled   Blood sugars  are again somewhat high despite increasing his insulin. He appears to have relatively high readings before supper and has a long interval of time between breakfast and supper. Currently not covering his lunch with any mealtime insulin even though sometimes he has significant amount of carbohydrate His last A1c was 9% using Victoza 1.8 mg and premixed insulin Not clear.  Sugars are higher with stopping Victoza but he cannot afford this now  Have recommended adding a dose of insulin at lunchtime to cover his lunch meal and improved blood sugars before supper also.  Instructions as below Not clear if he needs additional insulin at supper time since he is not monitoring postprandial readings much and only occasional fasting readings He has maintained his weight with regular exercise Consider restarting Victoza next year when he may be able to afford it    Patient Instructions  If eating lunch (sandwich) then 8 units before eating lunch  Stay on 15 units at [place] and lunch     Ibrahem Volkman 04/22/2014, 5:10 PM

## 2014-04-24 ENCOUNTER — Ambulatory Visit: Payer: Commercial Managed Care - HMO | Admitting: Endocrinology

## 2014-04-27 ENCOUNTER — Ambulatory Visit: Payer: Commercial Managed Care - HMO | Admitting: Internal Medicine

## 2014-04-28 ENCOUNTER — Other Ambulatory Visit: Payer: Self-pay | Admitting: Internal Medicine

## 2014-04-29 ENCOUNTER — Encounter: Payer: Self-pay | Admitting: Internal Medicine

## 2014-04-29 ENCOUNTER — Ambulatory Visit (INDEPENDENT_AMBULATORY_CARE_PROVIDER_SITE_OTHER): Payer: Commercial Managed Care - HMO | Admitting: Internal Medicine

## 2014-04-29 VITALS — BP 150/90 | HR 56 | Temp 97.8°F | Wt 228.1 lb

## 2014-04-29 DIAGNOSIS — I10 Essential (primary) hypertension: Secondary | ICD-10-CM

## 2014-04-29 MED ORDER — ISOSORBIDE MONONITRATE ER 60 MG PO TB24: ORAL_TABLET | ORAL | Status: DC

## 2014-04-29 MED ORDER — LOSARTAN POTASSIUM 100 MG PO TABS: 100.0000 mg | ORAL_TABLET | Freq: Every day | ORAL | Status: DC

## 2014-04-29 MED ORDER — AMLODIPINE BESYLATE 10 MG PO TABS: ORAL_TABLET | ORAL | Status: DC

## 2014-04-29 NOTE — Progress Notes (Signed)
Pre visit review using our clinic review tool, if applicable. No additional management support is needed unless otherwise documented below in the visit note. 

## 2014-04-29 NOTE — Patient Instructions (Addendum)
   Continue the clonidine 0.1 mg daily until the new prescription for losartan 100 milligrams daily is received. At that time stop the clonidine. Minimal Blood Pressure Goal= AVERAGE < 140/90;  Ideal is an AVERAGE < 135/85. This AVERAGE should be calculated from @ least 5-7 BP readings taken @ different times of day on different days of week. You should not respond to isolated BP readings , but rather the AVERAGE for that week .Please bring your  blood pressure cuff to office visits to verify that it is reliable.It  can also be checked against the blood pressure device at the pharmacy. Finger or wrist cuffs are not dependable; an arm cuff is.

## 2014-04-29 NOTE — Progress Notes (Signed)
   Subjective:    Patient ID: Corey [name], male    DOB: 12/06/1945, 68 y.o.   MRN: [id number]  HPI  He is here for follow-up of his hypertension. He was found to be extremely elevated at his most recent endocrinology visit  He does check it at the pharmacy on occasion up to twice a week. He cannot remember when this was last performed  He does restrict sodium. He states he walks 3 miles 3 times a week. He has some exertional dyspnea but no other cardiovascular symptoms     Review of Systems   Chest pain, palpitations, tachycardia,  paroxysmal nocturnal dyspnea, claudication or edema are absent.       Objective:   Physical Exam   Pertinent or positive findings include: He has a grade 1 systolic murmur Abdomen is massive. He has 1+ edema at the sock line Posterior tibial pulses are decreased.  General appearance :adequately nourished; in no distress. Eyes: No conjunctival inflammation or scleral icterus is present. Heart:  Normal rate and regular rhythm. S1 and S2 normal without gallop,  click, rub or other extra sounds   Lungs:Chest clear to auscultation; no wheezes, rhonchi,rales ,or rubs present.No increased work of breathing.  Abdomen: bowel sounds normal, soft and non-tender without masses, organomegaly or hernias noted.  No guarding or rebound.  Vascular : all pulses equal ; no bruits present. Skin:Warm & dry.  Intact without suspicious lesions or rashes ; no tenting Lymphatic: No lymphadenopathy is noted about the head, neck, axilla            Assessment & Plan:  #1 poorly controlled hypertension  Plan: He will continue his clonidine until he receives his mail order of Losartan 100 mg. This should be synergistic with his calcium channel blocker and more efficacious in the face of diabetes.  Blood pressure goals and monitoring discussed.

## 2014-05-09 ENCOUNTER — Other Ambulatory Visit: Payer: Self-pay | Admitting: Internal Medicine

## 2014-06-05 ENCOUNTER — Ambulatory Visit: Payer: Commercial Managed Care - HMO | Admitting: Dietician

## 2014-06-30 NOTE — Telephone Encounter (Signed)
Error

## 2014-07-20 ENCOUNTER — Other Ambulatory Visit (INDEPENDENT_AMBULATORY_CARE_PROVIDER_SITE_OTHER): Payer: Commercial Managed Care - HMO

## 2014-07-20 DIAGNOSIS — E1165 Type 2 diabetes mellitus with hyperglycemia: Secondary | ICD-10-CM | POA: Diagnosis not present

## 2014-07-20 DIAGNOSIS — IMO0002 Reserved for concepts with insufficient information to code with codable children: Secondary | ICD-10-CM

## 2014-07-20 LAB — URINALYSIS, ROUTINE W REFLEX MICROSCOPIC
Bilirubin Urine: NEGATIVE
Ketones, ur: NEGATIVE
Leukocytes, UA: NEGATIVE
NITRITE: NEGATIVE
Specific Gravity, Urine: 1.025 (ref 1.000–1.030)
Total Protein, Urine: 30 — AB
UROBILINOGEN UA: 0.2 (ref 0.0–1.0)
Urine Glucose: NEGATIVE
pH: 5.5 (ref 5.0–8.0)

## 2014-07-20 LAB — MICROALBUMIN / CREATININE URINE RATIO
CREATININE, U: 121.9 mg/dL
MICROALB/CREAT RATIO: 24.4 mg/g (ref 0.0–30.0)
Microalb, Ur: 29.7 mg/dL — ABNORMAL HIGH (ref 0.0–1.9)

## 2014-07-20 LAB — HEMOGLOBIN A1C: HEMOGLOBIN A1C: 10.1 % — AB (ref 4.6–6.5)

## 2014-07-20 LAB — BASIC METABOLIC PANEL
BUN: 19 mg/dL (ref 6–23)
CO2: 26 mEq/L (ref 19–32)
Calcium: 10.1 mg/dL (ref 8.4–10.5)
Chloride: 107 mEq/L (ref 96–112)
Creatinine, Ser: 1.72 mg/dL — ABNORMAL HIGH (ref 0.40–1.50)
GFR: 51.38 mL/min — AB (ref >60.00)
GLUCOSE: 193 mg/dL — AB (ref 70–99)
Potassium: 4 mEq/L (ref 3.5–5.1)
Sodium: 138 mEq/L (ref 135–145)

## 2014-07-22 ENCOUNTER — Ambulatory Visit (INDEPENDENT_AMBULATORY_CARE_PROVIDER_SITE_OTHER): Payer: Commercial Managed Care - HMO | Admitting: Endocrinology

## 2014-07-22 ENCOUNTER — Encounter: Payer: Self-pay | Admitting: Endocrinology

## 2014-07-22 ENCOUNTER — Other Ambulatory Visit: Payer: Self-pay | Admitting: *Deleted

## 2014-07-22 VITALS — BP 140/80 | HR 48 | Temp 97.9°F | Resp 16 | Ht 66.0 in | Wt 231.0 lb

## 2014-07-22 DIAGNOSIS — E1165 Type 2 diabetes mellitus with hyperglycemia: Secondary | ICD-10-CM | POA: Diagnosis not present

## 2014-07-22 DIAGNOSIS — N182 Chronic kidney disease, stage 2 (mild): Secondary | ICD-10-CM | POA: Diagnosis not present

## 2014-07-22 DIAGNOSIS — I10 Essential (primary) hypertension: Secondary | ICD-10-CM

## 2014-07-22 DIAGNOSIS — IMO0002 Reserved for concepts with insufficient information to code with codable children: Secondary | ICD-10-CM

## 2014-07-22 MED ORDER — LIRAGLUTIDE 18 MG/3ML ~~LOC~~ SOPN: 1.2000 mg | PEN_INJECTOR | Freq: Every day | SUBCUTANEOUS | Status: DC

## 2014-07-22 NOTE — Progress Notes (Signed)
Patient ID: Corey [name], male   DOB: 08-Sep-1945, 69 y.o.   MRN: [id number]   Reason for Appointment : Followup of Type 2 Diabetes  History of Present Illness          Diagnosis: Type 2 diabetes mellitus, date of diagnosis: 1997      Past history: The patient was initially treated with various oral hypoglycemic drugs and details are not available. About 2009 he was started on insulin and has either taken premixed insulin or Lantus once a day He had difficulty affording his Lantus insulin and prefers to take premixed insulin  Recent history:    He had been on Victoza in addition to his insulin since 06/2013 which had helped with his weight Because of cost he stopped taking this in 03/2014 With this his blood sugars started going up  His insulin requirement has increased overall  However he cannot keep up with his instructions for the insulin and is taking smaller doses than he was prescribed Also he was told to start taking a dose of 8 units at lunchtime to cover his sandwich but he is not doing this. A1c is now over 10%, previously 9%; his monitor again has not been reviewed because he did not bring his meter today  Current blood sugar patterns and problems:  He thinks his blood sugars are not high in the morning  Usually not checking his blood sugar later in the day  His weight is starting to increase and has gone up significantly since December  He has not exercised as much as before  Hypoglycemia : none    INSULIN regimen is described as:  12 UNITS Humalog mix bid    Glucose monitoring:  done less than one time a day         Glucometer:  Accu-Chek      Blood Glucose readings as above  Self-care:       Meals: 3 meals per day. Egg in am at 10 am , sometimes sandwich at lunch. His largest meal is at suppertime at about 8 pm.  Physical activity: exercise: Walking 30-40 minutes; 6/7  days, usually 7 am       Dietician visit: Most recent: 02/2014               Oral  hypoglycemic drugs the patient is taking are:  none        Compliance with the medical regimen:  Fair  Retinal exam: Most recent: about a year ago .    Wt Readings from Last 3 Encounters:  07/22/14 231 lb (104.781 kg)  04/29/14 228 lb 2 oz (103.477 kg)  04/22/14 224 lb 6.4 oz (101.787 kg)    Lab Results  Component Value Date   HGBA1C 10.1* 07/20/2014   HGBA1C 9.0* 02/19/2014   HGBA1C 8.4* 09/19/2013   Lab Results  Component Value Date   MICROALBUR 29.7* 07/20/2014   LDLCALC 62 04/20/2014   CREATININE 1.72* 07/20/2014   Lab on 07/20/2014  Component Date Value Ref Range Status  . Hgb A1c MFr Bld 07/20/2014 10.1* 4.6 - 6.5 % Final   Glycemic Control Guidelines for People with Diabetes:Non Diabetic:  <6%Goal of Therapy: <7%Additional Action Suggested:  >8%   . Sodium 07/20/2014 138  135 - 145 mEq/L Final  . Potassium 07/20/2014 4.0  3.5 - 5.1 mEq/L Final  . Chloride 07/20/2014 107  96 - 112 mEq/L Final  . CO2 07/20/2014 26  19 - 32 mEq/L Final  .  Glucose, Bld 07/20/2014 193* 70 - 99 mg/dL Final  . BUN 07/20/2014 19  6 - 23 mg/dL Final  . Creatinine, Ser 07/20/2014 1.72* 0.40 - 1.50 mg/dL Final  . Calcium 07/20/2014 10.1  8.4 - 10.5 mg/dL Final  . GFR 07/20/2014 51.38* >60.00 mL/min Final  . Microalb, Ur 07/20/2014 29.7* 0.0 - 1.9 mg/dL Final  . Creatinine,U 07/20/2014 121.9   Final  . Microalb Creat Ratio 07/20/2014 24.4  0.0 - 30.0 mg/g Final  . Color, Urine 07/20/2014 YELLOW  Yellow;Lt. Yellow Final  . APPearance 07/20/2014 CLEAR  Clear Final  . Specific Gravity, Urine 07/20/2014 1.025  1.000-1.030 Final  . pH 07/20/2014 5.5  5.0 - 8.0 Final  . Total Protein, Urine 07/20/2014 30* Negative Final  . Urine Glucose 07/20/2014 NEGATIVE  Negative Final  . Ketones, ur 07/20/2014 NEGATIVE  Negative Final  . Bilirubin Urine 07/20/2014 NEGATIVE  Negative Final  . Hgb urine dipstick 07/20/2014 TRACE-INTACT* Negative Final  . Urobilinogen, UA 07/20/2014 0.2  0.0 - 1.0 Final  .  Leukocytes, UA 07/20/2014 NEGATIVE  Negative Final  . Nitrite 07/20/2014 NEGATIVE  Negative Final  . WBC, UA 07/20/2014 0-2/hpf  0-2/hpf Final  . RBC / HPF 07/20/2014 0-2/hpf  0-2/hpf Final  . Squamous Epithelial / LPF 07/20/2014 Rare(0-4/hpf)  Rare(0-4/hpf) Final           Medication List       This list is accurate as of: 07/22/14  5:13 PM.  Always use your most recent med list.               amLODipine 10 MG tablet  Commonly known as:  NORVASC  TAKE 1 TABLET EVERY DAY     cloNIDine 0.1 MG tablet  Commonly known as:  CATAPRES  TAKE ONE TABLET BY MOUTH ONCE DAILY     glucose blood test strip  Commonly known as:  ACCU-CHEK AVIVA PLUS  Use as instructed to check blood sugars 2 times per day dx code 250.42     Insulin Lispro Prot & Lispro (75-25) 100 UNIT/ML Kwikpen  Commonly known as:  HUMALOG 75/25 MIX  12 Units. Inject 12 units twice a day     isosorbide mononitrate 60 MG 24 hr tablet  Commonly known as:  IMDUR  TAKE 1 TABLET TWICE DAILY     Liraglutide 18 MG/3ML Sopn  Inject 0.2 mLs (1.2 mg total) into the skin daily.     losartan 100 MG tablet  Commonly known as:  COZAAR  Take 1 tablet (100 mg total) by mouth daily.     metoprolol succinate 100 MG 24 hr tablet  Commonly known as:  TOPROL-XL  TAKE 1 TABLET TWICE DAILY  WITH  OR  IMMEDIATELY  FOLLOWING A MEAL (NEED MD APPOINTMENT FOR REFILLS)        Allergies:  Allergies  Allergen Reactions  . Hydrochlorothiazide     Gout , uncontrolled diabetes and renal insufficiency  . Allopurinol     Renal insufficiency    Past Medical History  Diagnosis Date  . Renal insufficiency   . Diabetes mellitus   . Hypertension   . Gout   . Colon polyp 2003    Dr [name]; F/U was to be 2008( not completed)  . Diverticulosis 2003  . Pneumonia 09/29/11    Avelox X 10 days as OP    Past Surgical History  Procedure Laterality Date  . Refractive surgery  2005  . Colonoscopy w/ polypectomy  2003  no F/U ([place]  discussed 12/03/12)  . Wrist aspiration  02/16/12     monosodium urate crystals; Dr Caralyn Guile    Family History  Problem Relation Age of Onset  . Hypertension Father   . Prostate cancer Father   . Diabetes Father   . Hypertension Mother   . Diabetes Mother   . Stroke Neg Hx   . Heart disease Neg Hx     Social History:  reports that he quit smoking about 46 years ago. He has never used smokeless tobacco. He reports that he does not drink alcohol or use illicit drugs.    Review of Systems   Lipids: Normal and currently not on a statin drug  Lab Results  Component Value Date   CHOL 113 04/20/2014   HDL 38.50* 04/20/2014   LDLCALC 62 04/20/2014   TRIG 64.0 04/20/2014   CHOLHDL 3 04/20/2014    Eye exams: Irregular    He has mild renal insufficiency of unclear etiology with variable creatinine levels, now near normal   Lab Results  Component Value Date   CREATININE 1.72* 07/20/2014   HYPERTENSION: He has not followed up with his PCP  again as directed for blood pressure management  He thinks he is taking all his medications as directed He thinks he is taking his clonidine 3 times a day without side effects.  Also taking Norvasc and metoprolol 100 mg   Physical Examination:  BP 140/80 mmHg  Pulse 48  Temp(Src) 97.9 F (36.6 C)  Resp 16  Ht 5\' 6"  (1.676 m)  Wt 231 lb (104.781 kg)  BMI 37.30 kg/m2  SpO2 93%   no pedal edema   ASSESSMENT/PLAN:  Diabetes type 2, uncontrolled   Blood sugars are again somewhat high despite increasing his insulin. He appears to have relatively high readings before supper and has a long interval of time between breakfast and supper. Currently not covering his lunch with any mealtime insulin even though sometimes he has significant amount of carbohydrate His last A1c was 9% using Victoza 1.8 mg and premixed insulin Not clear.  Sugars are higher with stopping Victoza but he cannot afford this now  Have recommended adding a dose of  insulin at lunchtime to cover his lunch meal and improved blood sugars before supper also.  Instructions as below Not clear if he needs additional insulin at supper time since he is not monitoring postprandial readings much and only occasional fasting readings He has maintained his weight with regular exercise Consider restarting Victoza next year when he may be able to afford it    Patient Instructions  Restart Victoza with 0.6mg  dose for 3 days then go to 1.2  INSULIN DOSES 15 in am, 6 before lunch and 12 at supper. Call id sugar <[phone]  Please check blood sugars at least half the time about 2 hours after any meal and 3 times per week on waking up. Please bring blood sugar monitor to each visit. Recommended blood sugar levels about 2 hours after meal is 140-180 and on waking up 90-130  Reduce Metoprolol to 1/2 tab daily  Walk daily     Tighe Gitto 07/22/2014, 5:13 PM   UNITS Humalog mix bid    Glucose monitoring:  done less than one time a day         Glucometer:  Accu-Chek      Blood Glucose readings 120 am us> 200  PRE-MEAL Breakfast Lunch Dinner Bedtime Overall  Glucose range:  Mean/median:        POST-MEAL PC Breakfast PC Lunch PC Dinner  Glucose range:     Mean/median:        PRE-MEAL Breakfast  afternoon  Dinner Bedtime  average   Glucose range:  151-167   172, 176   96-243  ?    169    Self-care:       Meals: 3 meals per day. Egg in am at 10 am , sometimes sandwich at lunch. His largest meal is at suppertime at about 8 pm.  Physical activity: exercise: Walking 30-40 minutes; 3/7  days, usually 7 am       Dietician visit: Most recent: 02/2014               Oral hypoglycemic drugs the patient is taking are:  none        Compliance with the medical regimen:  Fair  Retinal exam: Most recent: about a year ago .    Wt Readings from Last 3 Encounters:  07/22/14 231 lb (104.781 kg)  04/29/14 228 lb 2 oz (103.477 kg)  04/22/14 224 lb 6.4 oz (101.787 kg)    Lab  Results  Component Value Date   HGBA1C 10.1* 07/20/2014   HGBA1C 9.0* 02/19/2014   HGBA1C 8.4* 09/19/2013   Lab Results  Component Value Date   MICROALBUR 29.7* 07/20/2014   LDLCALC 62 04/20/2014   CREATININE 1.72* 07/20/2014   Lab on 07/20/2014  Component Date Value Ref Range Status  . Hgb A1c MFr Bld 07/20/2014 10.1* 4.6 - 6.5 % Final   Glycemic Control Guidelines for People with Diabetes:Non Diabetic:  <6%Goal of Therapy: <7%Additional Action Suggested:  >8%   . Sodium 07/20/2014 138  135 - 145 mEq/L Final  . Potassium 07/20/2014 4.0  3.5 - 5.1 mEq/L Final  . Chloride 07/20/2014 107  96 - 112 mEq/L Final  . CO2 07/20/2014 26  19 - 32 mEq/L Final  . Glucose, Bld 07/20/2014 193* 70 - 99 mg/dL Final  . BUN 07/20/2014 19  6 - 23 mg/dL Final  . Creatinine, Ser 07/20/2014 1.72* 0.40 - 1.50 mg/dL Final  . Calcium 07/20/2014 10.1  8.4 - 10.5 mg/dL Final  . GFR 07/20/2014 51.38* >60.00 mL/min Final  . Microalb, Ur 07/20/2014 29.7* 0.0 - 1.9 mg/dL Final  . Creatinine,U 07/20/2014 121.9   Final  . Microalb Creat Ratio 07/20/2014 24.4  0.0 - 30.0 mg/g Final  . Color, Urine 07/20/2014 YELLOW  Yellow;Lt. Yellow Final  . APPearance 07/20/2014 CLEAR  Clear Final  . Specific Gravity, Urine 07/20/2014 1.025  1.000-1.030 Final  . pH 07/20/2014 5.5  5.0 - 8.0 Final  . Total Protein, Urine 07/20/2014 30* Negative Final  . Urine Glucose 07/20/2014 NEGATIVE  Negative Final  . Ketones, ur 07/20/2014 NEGATIVE  Negative Final  . Bilirubin Urine 07/20/2014 NEGATIVE  Negative Final  . Hgb urine dipstick 07/20/2014 TRACE-INTACT* Negative Final  . Urobilinogen, UA 07/20/2014 0.2  0.0 - 1.0 Final  . Leukocytes, UA 07/20/2014 NEGATIVE  Negative Final  . Nitrite 07/20/2014 NEGATIVE  Negative Final  . WBC, UA 07/20/2014 0-2/hpf  0-2/hpf Final  . RBC / HPF 07/20/2014 0-2/hpf  0-2/hpf Final  . Squamous Epithelial / LPF 07/20/2014 Rare(0-4/hpf)  Rare(0-4/hpf) Final       Medication List       This  list is accurate as of: 07/22/14  5:13 PM.  Always use your most recent med list.  amLODipine 10 MG tablet  Commonly known as:  NORVASC  TAKE 1 TABLET EVERY DAY     cloNIDine 0.1 MG tablet  Commonly known as:  CATAPRES  TAKE ONE TABLET BY MOUTH ONCE DAILY     glucose blood test strip  Commonly known as:  ACCU-CHEK AVIVA PLUS  Use as instructed to check blood sugars 2 times per day dx code 250.42     Insulin Lispro Prot & Lispro (75-25) 100 UNIT/ML Kwikpen  Commonly known as:  HUMALOG 75/25 MIX  12 Units. Inject 12 units twice a day     isosorbide mononitrate 60 MG 24 hr tablet  Commonly known as:  IMDUR  TAKE 1 TABLET TWICE DAILY     Liraglutide 18 MG/3ML Sopn  Inject 0.2 mLs (1.2 mg total) into the skin daily.     losartan 100 MG tablet  Commonly known as:  COZAAR  Take 1 tablet (100 mg total) by mouth daily.     metoprolol succinate 100 MG 24 hr tablet  Commonly known as:  TOPROL-XL  TAKE 1 TABLET TWICE DAILY  WITH  OR  IMMEDIATELY  FOLLOWING A MEAL (NEED MD APPOINTMENT FOR REFILLS)        Allergies:  Allergies  Allergen Reactions  . Hydrochlorothiazide     Gout , uncontrolled diabetes and renal insufficiency  . Allopurinol     Renal insufficiency    Past Medical History  Diagnosis Date  . Renal insufficiency   . Diabetes mellitus   . Hypertension   . Gout   . Colon polyp 2003    Dr [name]; F/U was to be 2008( not completed)  . Diverticulosis 2003  . Pneumonia 09/29/11    Avelox X 10 days as OP    Past Surgical History  Procedure Laterality Date  . Refractive surgery  2005  . Colonoscopy w/ polypectomy  2003    no F/U ([place] discussed 12/03/12)  . Wrist aspiration  02/16/12     monosodium urate crystals; Dr Caralyn Guile    Family History  Problem Relation Age of Onset  . Hypertension Father   . Prostate cancer Father   . Diabetes Father   . Hypertension Mother   . Diabetes Mother   . Stroke Neg Hx   . Heart disease Neg Hx      Social History:  reports that he quit smoking about 46 years ago. He has never used smokeless tobacco. He reports that he does not drink alcohol or use illicit drugs.    Review of Systems   Lipids: Normal and currently not on a statin drug  Lab Results  Component Value Date   CHOL 113 04/20/2014   HDL 38.50* 04/20/2014   LDLCALC 62 04/20/2014   TRIG 64.0 04/20/2014   CHOLHDL 3 04/20/2014    Eye exams: Irregular    He has mild renal insufficiency of unclear etiology with variable creatinine levels, now near normal   Lab Results  Component Value Date   CREATININE 1.72* 07/20/2014   HYPERTENSION: He was told by his PCP to take losartan instead of clonidine but he thinks he is taking both Does not feel lightheaded, dizzy or weak despite his slow pulse rate He thinks he is taking all his medications as directed   Also taking Norvasc and metoprolol 100 mg   Physical Examination:  BP 140/80 mmHg  Pulse 48  Temp(Src) 97.9 F (36.6 C)  Resp 16  Ht 5\' 6"  (1.676 m)  Wt 231 lb (  104.781 kg)  BMI 37.30 kg/m2  SpO2 93%   no pedal edema  Repeat blood pressure 140/80 standing right arm   ASSESSMENT/PLAN:  Diabetes type 2, uncontrolled   Blood sugars are  totally out of control now and he is not able to understand how to take his insulin despite reviewing his instructions on each visit He has required larger doses of insulin especially with gaining weight and not taking Victoza which was helping him with better control and moderation of his weight He also has not exercise much Not clear what his compliance with his diet is Although he should do better with the basal bolus insulin regimen using Lantus and NovoLog he probably will not be up to follow the instructions He has not taken the insulin at lunchtime as discussed on his last visit  Since he is not in the donut hole he agrees to try and check on the cost of Victoza Discussed how to take this as well as titration and  possible side effects He also does not bring his blood sugar monitor consistently and not clear when or how often he is checking his blood sugar  Insulin doses were written out and discussed in detail with him today: He will take 15 at breakfast, 6 at lunch and 12 at suppertime  HYPERTENSION: Better controlled now but he is taking 4 drugs, possibly having some bradycardia with adding clonidine to metoprolol and he can cut down metoprolol to half a tablet until seen by PCP  CKD: He has mild CKD, creatinine possibly lower with lowering his blood pressure   Patient Instructions  Restart Victoza with 0.6mg  dose for 3 days then go to 1.2  INSULIN DOSES 15 in am, 6 before lunch and 12 at supper. Call id sugar <[phone]  Please check blood sugars at least half the time about 2 hours after any meal and 3 times per week on waking up. Please bring blood sugar monitor to each visit. Recommended blood sugar levels about 2 hours after meal is 140-180 and on waking up 90-130  Reduce Metoprolol to 1/2 tab daily  Walk daily   Counseling time over 50% of today's 25 minute visit  Mckenzie Bove 07/22/2014, 5:13 PM

## 2014-07-22 NOTE — Patient Instructions (Addendum)
Restart Victoza with 0.6mg  dose for 3 days then go to 1.2  INSULIN DOSES 15 in am, 6 before lunch and 12 at supper. Call id sugar <[phone]  Please check blood sugars at least half the time about 2 hours after any meal and 3 times per week on waking up. Please bring blood sugar monitor to each visit. Recommended blood sugar levels about 2 hours after meal is 140-180 and on waking up 90-130  Reduce Metoprolol to 1/2 tab daily  Walk daily

## 2014-08-28 ENCOUNTER — Other Ambulatory Visit (INDEPENDENT_AMBULATORY_CARE_PROVIDER_SITE_OTHER): Payer: Commercial Managed Care - HMO

## 2014-08-28 DIAGNOSIS — IMO0002 Reserved for concepts with insufficient information to code with codable children: Secondary | ICD-10-CM

## 2014-08-28 DIAGNOSIS — E1165 Type 2 diabetes mellitus with hyperglycemia: Secondary | ICD-10-CM

## 2014-08-28 LAB — BASIC METABOLIC PANEL
BUN: 23 mg/dL (ref 6–23)
CO2: 27 meq/L (ref 19–32)
CREATININE: 1.54 mg/dL — AB (ref 0.40–1.50)
Calcium: 10.1 mg/dL (ref 8.4–10.5)
Chloride: 109 mEq/L (ref 96–112)
GFR: 58.35 mL/min — AB (ref >60.00)
GLUCOSE: 159 mg/dL — AB (ref 70–99)
Potassium: 4.1 mEq/L (ref 3.5–5.1)
Sodium: 139 mEq/L (ref 135–145)

## 2014-08-29 LAB — FRUCTOSAMINE: Fructosamine: 368 umol/L — ABNORMAL HIGH (ref 0–285)

## 2014-09-02 ENCOUNTER — Encounter: Payer: Self-pay | Admitting: Endocrinology

## 2014-09-02 ENCOUNTER — Ambulatory Visit (INDEPENDENT_AMBULATORY_CARE_PROVIDER_SITE_OTHER): Payer: Commercial Managed Care - HMO | Admitting: Endocrinology

## 2014-09-02 VITALS — BP 142/88 | HR 54 | Temp 98.6°F | Resp 14 | Ht 66.0 in | Wt 230.2 lb

## 2014-09-02 DIAGNOSIS — N182 Chronic kidney disease, stage 2 (mild): Secondary | ICD-10-CM | POA: Diagnosis not present

## 2014-09-02 DIAGNOSIS — E1165 Type 2 diabetes mellitus with hyperglycemia: Secondary | ICD-10-CM | POA: Diagnosis not present

## 2014-09-02 DIAGNOSIS — I10 Essential (primary) hypertension: Secondary | ICD-10-CM | POA: Diagnosis not present

## 2014-09-02 DIAGNOSIS — IMO0002 Reserved for concepts with insufficient information to code with codable children: Secondary | ICD-10-CM

## 2014-09-02 NOTE — Progress Notes (Signed)
Patient ID: Corey [name], male   DOB: 1945/08/12, 69 y.o.   MRN: [id number]   Reason for Appointment : Followup of Type 2 Diabetes  History of Present Illness          Diagnosis: Type 2 diabetes mellitus, date of diagnosis: 1997      Past history: The patient was initially treated with various oral hypoglycemic drugs and details are not available. About 2009 he was started on insulin and has either taken premixed insulin or Lantus once a day He had difficulty affording his Lantus insulin and prefers to take premixed insulin  Recent history:    INSULIN regimen is described as:  12 UNITS Humalog mix bid    He has been given instructions for his insulin dose on each visit and the printed information is reviewed in detail before he leaves but he still does not follow the instructions and continues to take only 12 units twice a day of insulin without adequate control He now cannot afford Victoza which previously had helped his control and his weight A1c recently was over 10%, previously 9%; fructosamine is 368 now  Current blood sugar patterns and problems:  Usually not checking his blood sugar during the day and mostly before supper time  He was told to take insulin for his lunch since he is eating a sandwich but he does not do so  Not able to take metformin because of renal dysfunction  His weight is starting to increase and has gone up significantly since December  He has not been able to lose weight  Hypoglycemia : none    Glucose monitoring:  done less than one time a day         Glucometer:  Accu-Chek      Blood Glucose readings before supper range from 114-227 with average about 155.  Has only one morning reading of 184 and 1 bedtime reading of 225 with overall average 161  Self-care:       Meals: 3 meals per day. Egg in am at 10 am , sometimes sandwich at lunch. His largest meal is at suppertime at about 8 pm.  Physical activity: exercise: Walking 30-40 minutes;  6/7  days, usually 7 am       Dietician visit: Most recent: 02/2014               Oral hypoglycemic drugs the patient is taking are:  none        Compliance with the medical regimen:  Fair  Retinal exam: Most recent: about a year ago .    Wt Readings from Last 3 Encounters:  09/02/14 230 lb 3.2 oz (104.418 kg)  07/22/14 231 lb (104.781 kg)  04/29/14 228 lb 2 oz (103.477 kg)    Lab Results  Component Value Date   HGBA1C 10.1* 07/20/2014   HGBA1C 9.0* 02/19/2014   HGBA1C 8.4* 09/19/2013   Lab Results  Component Value Date   MICROALBUR 29.7* 07/20/2014   LDLCALC 62 04/20/2014   CREATININE 1.54* 08/28/2014   Lab on 08/28/2014  Component Date Value Ref Range Status  . Sodium 08/28/2014 139  135 - 145 mEq/L Final  . Potassium 08/28/2014 4.1  3.5 - 5.1 mEq/L Final  . Chloride 08/28/2014 109  96 - 112 mEq/L Final  . CO2 08/28/2014 27  19 - 32 mEq/L Final  . Glucose, Bld 08/28/2014 159* 70 - 99 mg/dL Final  . BUN 08/28/2014 23  6 - 23 mg/dL Final  .  Creatinine, Ser 08/28/2014 1.54* 0.40 - 1.50 mg/dL Final  . Calcium 08/28/2014 10.1  8.4 - 10.5 mg/dL Final  . GFR 08/28/2014 58.35* >60.00 mL/min Final  . Fructosamine 08/28/2014 368* 0 - 285 umol/L Final   Comment: Published reference interval for apparently healthy subjects between age 39 and 25 is 40 - 285 umol/L and in a poorly controlled diabetic population is 228 - 563 umol/L with a mean of 396 umol/L.            Medication List       This list is accurate as of: 09/02/14  3:20 PM.  Always use your most recent med list.               amLODipine 10 MG tablet  Commonly known as:  NORVASC  TAKE 1 TABLET EVERY DAY     cloNIDine 0.1 MG tablet  Commonly known as:  CATAPRES  TAKE ONE TABLET BY MOUTH ONCE DAILY     glucose blood test strip  Commonly known as:  ACCU-CHEK AVIVA PLUS  Use as instructed to check blood sugars 2 times per day dx code 250.42     Insulin Lispro Prot & Lispro (75-25) 100 UNIT/ML Kwikpen    Commonly known as:  HUMALOG 75/25 MIX  12 Units. Inject 12 units twice a day     isosorbide mononitrate 60 MG 24 hr tablet  Commonly known as:  IMDUR  TAKE 1 TABLET TWICE DAILY     losartan 100 MG tablet  Commonly known as:  COZAAR  Take 1 tablet (100 mg total) by mouth daily.     metoprolol succinate 100 MG 24 hr tablet  Commonly known as:  TOPROL-XL  TAKE 1 TABLET TWICE DAILY  WITH  OR  IMMEDIATELY  FOLLOWING A MEAL (NEED MD APPOINTMENT FOR REFILLS)        Allergies:  Allergies  Allergen Reactions  . Hydrochlorothiazide     Gout , uncontrolled diabetes and renal insufficiency  . Allopurinol     Renal insufficiency    Past Medical History  Diagnosis Date  . Renal insufficiency   . Diabetes mellitus   . Hypertension   . Gout   . Colon polyp 2003    Dr [name]; F/U was to be 2008( not completed)  . Diverticulosis 2003  . Pneumonia 09/29/11    Avelox X 10 days as OP    Past Surgical History  Procedure Laterality Date  . Refractive surgery  2005  . Colonoscopy w/ polypectomy  2003    no F/U ([place] discussed 12/03/12)  . Wrist aspiration  02/16/12     monosodium urate crystals; Dr Caralyn Guile    Family History  Problem Relation Age of Onset  . Hypertension Father   . Prostate cancer Father   . Diabetes Father   . Hypertension Mother   . Diabetes Mother   . Stroke Neg Hx   . Heart disease Neg Hx     Social History:  reports that he quit smoking about 46 years ago. He has never used smokeless tobacco. He reports that he does not drink alcohol or use illicit drugs.    Review of Systems   Lipids: Normal and currently not on a statin drug  Lab Results  Component Value Date   CHOL 113 04/20/2014   HDL 38.50* 04/20/2014   LDLCALC 62 04/20/2014   TRIG 64.0 04/20/2014   CHOLHDL 3 04/20/2014    Eye exams: Irregular    He has  mild renal insufficiency of unclear etiology with variable creatinine levels, now near normal   Lab Results  Component Value Date    CREATININE 1.54* 08/28/2014   HYPERTENSION: He has not followed up with his PCP  again as directed for blood pressure management  He thinks he is taking all his medications as directed He thinks he is taking his clonidine 3 times a day without side effects.  Also taking Norvasc and metoprolol 100 mg   Physical Examination:  BP 142/88 mmHg  Pulse 54  Temp(Src) 98.6 F (37 C)  Resp 14  Ht 5\' 6"  (1.676 m)  Wt 230 lb 3.2 oz (104.418 kg)  BMI 37.17 kg/m2  SpO2 94%   no pedal edema   ASSESSMENT/PLAN:  Diabetes type 2, uncontrolled   Blood sugars are again somewhat high despite increasing his insulin. He appears to have relatively high readings before supper and has a long interval of time between breakfast and supper. Currently not covering his lunch with any mealtime insulin even though sometimes he has significant amount of carbohydrate His last A1c was 9% using Victoza 1.8 mg and premixed insulin Not clear.  Sugars are higher with stopping Victoza but he cannot afford this now  Have recommended adding a dose of insulin at lunchtime to cover his lunch meal and improved blood sugars before supper also.  Instructions as below Not clear if he needs additional insulin at supper time since he is not monitoring postprandial readings much and only occasional fasting readings He has maintained his weight with regular exercise Consider restarting Victoza next year when he may be able to afford it    Patient Instructions  INSULIN DOSES:  12 at breakfast,   6 at lunch and   12 at suppertime  Please check blood sugars at least half the time about 2 hours after any meal and 3 times per week on waking up.   Please bring blood sugar monitor to each visit. Recommended blood sugar levels about 2 hours after meal is 140-180 and on waking up 90-130      Hattie Aguinaldo 09/02/2014, 3:20 PM   UNITS Humalog mix bid    Glucose monitoring:  done less than one time a day          Glucometer:  Accu-Chek      Blood Glucose readings 120 am us> 200  PRE-MEAL Breakfast Lunch Dinner Bedtime Overall  Glucose range:       Mean/median:        POST-MEAL PC Breakfast PC Lunch PC Dinner  Glucose range:     Mean/median:        PRE-MEAL Breakfast  afternoon  Dinner Bedtime  average   Glucose range:  151-167   172, 176   96-243  ?    169    Self-care:       Meals: 3 meals per day. Egg in am at 10 am , sometimes sandwich at lunch. His largest meal is at suppertime at about 8 pm.  Physical activity: exercise: Walking 30-40 minutes; 3/7  days, usually 7 am       Dietician visit: Most recent: 02/2014               Oral hypoglycemic drugs the patient is taking are:  none        Compliance with the medical regimen:  Fair  Retinal exam: Most recent: about a year ago .    Wt Readings from Last 3 Encounters:  09/02/14 230 lb  3.2 oz (104.418 kg)  07/22/14 231 lb (104.781 kg)  04/29/14 228 lb 2 oz (103.477 kg)    Lab Results  Component Value Date   HGBA1C 10.1* 07/20/2014   HGBA1C 9.0* 02/19/2014   HGBA1C 8.4* 09/19/2013   Lab Results  Component Value Date   MICROALBUR 29.7* 07/20/2014   LDLCALC 62 04/20/2014   CREATININE 1.54* 08/28/2014   Lab on 08/28/2014  Component Date Value Ref Range Status  . Sodium 08/28/2014 139  135 - 145 mEq/L Final  . Potassium 08/28/2014 4.1  3.5 - 5.1 mEq/L Final  . Chloride 08/28/2014 109  96 - 112 mEq/L Final  . CO2 08/28/2014 27  19 - 32 mEq/L Final  . Glucose, Bld 08/28/2014 159* 70 - 99 mg/dL Final  . BUN 08/28/2014 23  6 - 23 mg/dL Final  . Creatinine, Ser 08/28/2014 1.54* 0.40 - 1.50 mg/dL Final  . Calcium 08/28/2014 10.1  8.4 - 10.5 mg/dL Final  . GFR 08/28/2014 58.35* >60.00 mL/min Final  . Fructosamine 08/28/2014 368* 0 - 285 umol/L Final   Comment: Published reference interval for apparently healthy subjects between age 82 and 37 is 82 - 285 umol/L and in a poorly controlled diabetic population is 228 - 563 umol/L with  a mean of 396 umol/L.        Medication List       This list is accurate as of: 09/02/14  3:20 PM.  Always use your most recent med list.               amLODipine 10 MG tablet  Commonly known as:  NORVASC  TAKE 1 TABLET EVERY DAY     cloNIDine 0.1 MG tablet  Commonly known as:  CATAPRES  TAKE ONE TABLET BY MOUTH ONCE DAILY     glucose blood test strip  Commonly known as:  ACCU-CHEK AVIVA PLUS  Use as instructed to check blood sugars 2 times per day dx code 250.42     Insulin Lispro Prot & Lispro (75-25) 100 UNIT/ML Kwikpen  Commonly known as:  HUMALOG 75/25 MIX  12 Units. Inject 12 units twice a day     isosorbide mononitrate 60 MG 24 hr tablet  Commonly known as:  IMDUR  TAKE 1 TABLET TWICE DAILY     losartan 100 MG tablet  Commonly known as:  COZAAR  Take 1 tablet (100 mg total) by mouth daily.     metoprolol succinate 100 MG 24 hr tablet  Commonly known as:  TOPROL-XL  TAKE 1 TABLET TWICE DAILY  WITH  OR  IMMEDIATELY  FOLLOWING A MEAL (NEED MD APPOINTMENT FOR REFILLS)        Allergies:  Allergies  Allergen Reactions  . Hydrochlorothiazide     Gout , uncontrolled diabetes and renal insufficiency  . Allopurinol     Renal insufficiency    Past Medical History  Diagnosis Date  . Renal insufficiency   . Diabetes mellitus   . Hypertension   . Gout   . Colon polyp 2003    Dr [name]; F/U was to be 2008( not completed)  . Diverticulosis 2003  . Pneumonia 09/29/11    Avelox X 10 days as OP    Past Surgical History  Procedure Laterality Date  . Refractive surgery  2005  . Colonoscopy w/ polypectomy  2003    no F/U ([place] discussed 12/03/12)  . Wrist aspiration  02/16/12     monosodium urate crystals; Dr Caralyn Guile  Family History  Problem Relation Age of Onset  . Hypertension Father   . Prostate cancer Father   . Diabetes Father   . Hypertension Mother   . Diabetes Mother   . Stroke Neg Hx   . Heart disease Neg Hx     Social History:   reports that he quit smoking about 46 years ago. He has never used smokeless tobacco. He reports that he does not drink alcohol or use illicit drugs.    Review of Systems   Lipids: Normal and currently not on a statin drug  Lab Results  Component Value Date   CHOL 113 04/20/2014   HDL 38.50* 04/20/2014   LDLCALC 62 04/20/2014   TRIG 64.0 04/20/2014   CHOLHDL 3 04/20/2014    Eye exams: Unknown    He has mild renal insufficiency of unclear etiology with variable creatinine levels, now fairly stable  Lab Results  Component Value Date   CREATININE 1.54* 08/28/2014   HYPERTENSION: He thinks he is taking all 4 medications for his hypertension but is somewhat unclear about this He thinks he is taking his losartan and clonidine as before   Also taking Norvasc and metoprolol 100 mg Has not followed up with PCP  Physical Examination:  BP 142/88 mmHg  Pulse 54  Temp(Src) 98.6 F (37 C)  Resp 14  Ht 5\' 6"  (1.676 m)  Wt 230 lb 3.2 oz (104.418 kg)  BMI 37.17 kg/m2  SpO2 94%   no pedal edema  Repeat blood pressure 130/80   ASSESSMENT/PLAN:  Diabetes type 2, uncontrolled   Blood sugars are still not well controlled since he does not follow his instructions for insulin despite repeated reminders and explaining the doses in detail on each visit He has required larger doses of insulin and also  gaining weight with not taking Victoza which was helping him with better control and moderation of his weight He has done some more walking and weight has leveled off Also discussed timing of glucose monitoring as he is checking blood sugars only around supper time   Insulin doses were written out and discussed in detail with him today: He will take 15 at breakfast, 6 at lunch and 12 at suppertime  Preventive care: He needs to have regular eye exams and is due for Prevnar  HYPERTENSION: Better controlled now and he is taking 4 drugs He was advised to take only half tablet of metoprolol  but not clear if he is doing so because of his bradycardia  CKD: He has mild CKD, stable and will continue to follow.   Have advised him to establish with his primary care physician  Patient Instructions  INSULIN DOSES:  12 at breakfast,   6 at lunch and   12 at suppertime  Please check blood sugars at least half the time about 2 hours after any meal and 3 times per week on waking up.   Please bring blood sugar monitor to each visit. Recommended blood sugar levels about 2 hours after meal is 140-180 and on waking up 90-130    Counseling time over 50% of today's 25 minute visit  Narmeen Kerper 09/02/2014, 3:20 PM

## 2014-09-02 NOTE — Patient Instructions (Signed)
INSULIN DOSES:  12 at breakfast,   6 at lunch and   12 at suppertime  Please check blood sugars at least half the time about 2 hours after any meal and 3 times per week on waking up.   Please bring blood sugar monitor to each visit. Recommended blood sugar levels about 2 hours after meal is 140-180 and on waking up 90-130

## 2014-10-28 ENCOUNTER — Other Ambulatory Visit (INDEPENDENT_AMBULATORY_CARE_PROVIDER_SITE_OTHER): Payer: Commercial Managed Care - HMO

## 2014-10-28 DIAGNOSIS — IMO0002 Reserved for concepts with insufficient information to code with codable children: Secondary | ICD-10-CM

## 2014-10-28 DIAGNOSIS — E1165 Type 2 diabetes mellitus with hyperglycemia: Secondary | ICD-10-CM

## 2014-10-28 LAB — MICROALBUMIN / CREATININE URINE RATIO
Creatinine,U: 197.6 mg/dL
Microalb Creat Ratio: 2.5 mg/g (ref 0.0–30.0)
Microalb, Ur: 5 mg/dL — ABNORMAL HIGH (ref 0.0–1.9)

## 2014-10-28 LAB — POCT URINALYSIS DIPSTICK
Bilirubin, UA: NEGATIVE
Blood, UA: NEGATIVE
Glucose, UA: NEGATIVE
KETONES UA: NEGATIVE
LEUKOCYTES UA: NEGATIVE
Nitrite, UA: NEGATIVE
Spec Grav, UA: 1.02
Urobilinogen, UA: 0.2
pH, UA: 5

## 2014-10-28 LAB — BASIC METABOLIC PANEL
BUN: 27 mg/dL — AB (ref 6–23)
CHLORIDE: 110 meq/L (ref 96–112)
CO2: 26 meq/L (ref 19–32)
CREATININE: 1.66 mg/dL — AB (ref 0.40–1.50)
Calcium: 9.6 mg/dL (ref 8.4–10.5)
GFR: 53.49 mL/min — ABNORMAL LOW (ref >60.00)
GLUCOSE: 173 mg/dL — AB (ref 70–99)
POTASSIUM: 4 meq/L (ref 3.5–5.1)
Sodium: 138 mEq/L (ref 135–145)

## 2014-10-28 LAB — LIPID PANEL
CHOLESTEROL: 114 mg/dL (ref 0–200)
HDL: 40.3 mg/dL (ref >39.00)
LDL Cholesterol: 59 mg/dL (ref 0–99)
NonHDL: 73.7
TRIGLYCERIDES: 75 mg/dL (ref 0.0–149.0)
Total CHOL/HDL Ratio: 3
VLDL: 15 mg/dL (ref 0.0–40.0)

## 2014-10-28 LAB — HEMOGLOBIN A1C: Hgb A1c MFr Bld: 8.1 % — ABNORMAL HIGH (ref 4.6–6.5)

## 2014-11-02 ENCOUNTER — Other Ambulatory Visit: Payer: Self-pay | Admitting: Internal Medicine

## 2014-11-02 ENCOUNTER — Ambulatory Visit: Payer: Commercial Managed Care - HMO | Admitting: Endocrinology

## 2014-11-09 ENCOUNTER — Other Ambulatory Visit: Payer: Self-pay

## 2014-11-24 ENCOUNTER — Encounter: Payer: Self-pay | Admitting: Endocrinology

## 2014-11-24 ENCOUNTER — Ambulatory Visit (INDEPENDENT_AMBULATORY_CARE_PROVIDER_SITE_OTHER): Payer: Commercial Managed Care - HMO | Admitting: Endocrinology

## 2014-11-24 VITALS — BP 130/82 | HR 50 | Temp 97.9°F | Resp 16 | Ht 66.0 in | Wt 219.0 lb

## 2014-11-24 DIAGNOSIS — IMO0002 Reserved for concepts with insufficient information to code with codable children: Secondary | ICD-10-CM

## 2014-11-24 DIAGNOSIS — I10 Essential (primary) hypertension: Secondary | ICD-10-CM | POA: Diagnosis not present

## 2014-11-24 DIAGNOSIS — E1165 Type 2 diabetes mellitus with hyperglycemia: Secondary | ICD-10-CM | POA: Diagnosis not present

## 2014-11-24 DIAGNOSIS — N182 Chronic kidney disease, stage 2 (mild): Secondary | ICD-10-CM | POA: Diagnosis not present

## 2014-11-24 NOTE — Progress Notes (Signed)
Patient ID: Corey [name], male   DOB: 20-Sep-1945, 69 y.o.   MRN: [id number]   Reason for Appointment : Followup of Type 2 Diabetes and hypertension  History of Present Illness          Diagnosis: Type 2 diabetes mellitus, date of diagnosis: 1997      Past history: The patient was initially treated with various oral hypoglycemic drugs and details are not available. About 2009 he was started on insulin and has either taken premixed insulin or Lantus once a day He had difficulty affording his Lantus insulin and prefers to take premixed insulin  Recent history:    INSULIN regimen is described as:  Humalog mix 12 at breakfast, 6 at lunch and 12 at suppertime  He has been started on insulin at lunch time also since his last visit and he has been taking this more regularly His A1c is now 8.1% which is significantly better than the last couple of readings He  cannot afford Victoza which previously had helped his control and his weight Did not bring his monitor today and blood sugar recall is probably very approximate  Current blood sugar patterns and problems:  His lab glucose was 175 but he does not think he sugar is usually higher in the morning  His blood sugar appears to be somewhat variable at suppertime not consistently high  He may have relatively higher blood sugars after supper but not clear if he is checking these regularly  Again his blood sugars by recall appear to be lower than expected from his A1c  He has however lost 11 pounds, not clear this is mostly a dietary change or more exercise; previously had gone up significantly over the last 4-5 months  Hypoglycemia : none    Glucose monitoring:  done less than one time a day         Glucometer:  Accu-Chek      Blood Glucose readings by recall  Mean values apply above for all meters except median for One Touch  PRE-MEAL Fasting Lunch Dinner Bedtime Overall  Glucose range: 125-150  95-160    Mean/median:         Self-care:       Meals: 3 meals per day. Egg in am at 9-10 am , sometimes sandwich at lunch. His largest meal is at suppertime at about 8 pm.  Physical activity: exercise: Walking 30-40 minutes; 6/7  days, usually 7 am       Dietician visit: Most recent: 02/2014               Oral hypoglycemic drugs the patient is taking are:  none        Compliance with the medical regimen:  Fair  Retinal exam: Most recent: about a year ago .    Wt Readings from Last 3 Encounters:  11/24/14 219 lb (99.338 kg)  09/02/14 230 lb 3.2 oz (104.418 kg)  07/22/14 231 lb (104.781 kg)    Lab Results  Component Value Date   HGBA1C 8.1* 10/28/2014   HGBA1C 10.1* 07/20/2014   HGBA1C 9.0* 02/19/2014   Lab Results  Component Value Date   MICROALBUR 5.0* 10/28/2014   LDLCALC 59 10/28/2014   CREATININE 1.66* 10/28/2014   No visits with results within 1 Week(s) from this visit. Latest known visit with results is:  Lab on 10/28/2014  Component Date Value Ref Range Status  . Hgb A1c MFr Bld 10/28/2014 8.1* 4.6 - 6.5 % Final  Glycemic Control Guidelines for People with Diabetes:Non Diabetic:  <6%Goal of Therapy: <7%Additional Action Suggested:  >8%   . Sodium 10/28/2014 138  135 - 145 mEq/L Final  . Potassium 10/28/2014 4.0  3.5 - 5.1 mEq/L Final  . Chloride 10/28/2014 110  96 - 112 mEq/L Final  . CO2 10/28/2014 26  19 - 32 mEq/L Final  . Glucose, Bld 10/28/2014 173* 70 - 99 mg/dL Final  . BUN 10/28/2014 27* 6 - 23 mg/dL Final  . Creatinine, Ser 10/28/2014 1.66* 0.40 - 1.50 mg/dL Final  . Calcium 10/28/2014 9.6  8.4 - 10.5 mg/dL Final  . GFR 10/28/2014 53.49* >60.00 mL/min Final  . Cholesterol 10/28/2014 114  0 - 200 mg/dL Final   ATP III Classification       Desirable:  < 200 mg/dL               Borderline High:  200 - 239 mg/dL          High:  > = 240 mg/dL  . Triglycerides 10/28/2014 75.0  0.0 - 149.0 mg/dL Final   Normal:  <150 mg/dLBorderline High:  150 - 199 mg/dL  . HDL 10/28/2014 40.30  >39.00  mg/dL Final  . VLDL 10/28/2014 15.0  0.0 - 40.0 mg/dL Final  . LDL Cholesterol 10/28/2014 59  0 - 99 mg/dL Final  . Total CHOL/HDL Ratio 10/28/2014 3   Final                  Men          Women1/2 Average Risk     3.4          3.3Average Risk          5.0          4.42X Average Risk          9.6          7.13X Average Risk          15.0          11.0                      . NonHDL 10/28/2014 73.70   Final   NOTE:  Non-HDL goal should be 30 mg/dL higher than patient's LDL goal (i.e. LDL goal of < 70 mg/dL, would have non-HDL goal of < 100 mg/dL)  . Microalb, Ur 10/28/2014 5.0* 0.0 - 1.9 mg/dL Final  . Creatinine,U 10/28/2014 197.6   Final  . Microalb Creat Ratio 10/28/2014 2.5  0.0 - 30.0 mg/g Final  . Color, UA 10/28/2014 Yellow   Final  . Clarity, UA 10/28/2014 Clear   Final  . Glucose, UA 10/28/2014 neg   Final  . Bilirubin, UA 10/28/2014 Neg   Final  . Ketones, UA 10/28/2014 Neg   Final  . Spec Grav, UA 10/28/2014 1.020   Final  . Blood, UA 10/28/2014 neg   Final  . pH, UA 10/28/2014 5.0   Final  . Protein, UA 10/28/2014 Trace   Final  . Urobilinogen, UA 10/28/2014 0.2   Final  . Nitrite, UA 10/28/2014 neg   Final  . Leukocytes, UA 10/28/2014 Negative  Negative Final           Medication List       This list is accurate as of: 11/24/14  1:08 PM.  Always use your most recent med list.  amLODipine 10 MG tablet  Commonly known as:  NORVASC  TAKE 1 TABLET EVERY DAY     cloNIDine 0.1 MG tablet  Commonly known as:  CATAPRES  TAKE ONE TABLET BY MOUTH ONCE DAILY     glucose blood test strip  Commonly known as:  ACCU-CHEK AVIVA PLUS  Use as instructed to check blood sugars 2 times per day dx code 250.42     Insulin Lispro Prot & Lispro (75-25) 100 UNIT/ML Kwikpen  Commonly known as:  HUMALOG 75/25 MIX  12 Units. Inject 12 units twice a day     isosorbide mononitrate 60 MG 24 hr tablet  Commonly known as:  IMDUR  TAKE 1 TABLET TWICE DAILY     losartan 100  MG tablet  Commonly known as:  COZAAR  TAKE 1 TABLET EVERY DAY     metoprolol succinate 100 MG 24 hr tablet  Commonly known as:  TOPROL-XL  TAKE 1 TABLET TWICE DAILY  WITH  OR  IMMEDIATELY  FOLLOWING A MEAL (NEED MD APPOINTMENT FOR REFILLS)        Allergies:  Allergies  Allergen Reactions  . Hydrochlorothiazide     Gout , uncontrolled diabetes and renal insufficiency  . Allopurinol     Renal insufficiency    Past Medical History  Diagnosis Date  . Renal insufficiency   . Diabetes mellitus   . Hypertension   . Gout   . Colon polyp 2003    Dr [name]; F/U was to be 2008( not completed)  . Diverticulosis 2003  . Pneumonia 09/29/11    Avelox X 10 days as OP    Past Surgical History  Procedure Laterality Date  . Refractive surgery  2005  . Colonoscopy w/ polypectomy  2003    no F/U ([place] discussed 12/03/12)  . Wrist aspiration  02/16/12     monosodium urate crystals; Dr Caralyn Guile    Family History  Problem Relation Age of Onset  . Hypertension Father   . Prostate cancer Father   . Diabetes Father   . Hypertension Mother   . Diabetes Mother   . Stroke Neg Hx   . Heart disease Neg Hx     Social History:  reports that he quit smoking about 46 years ago. He has never used smokeless tobacco. He reports that he does not drink alcohol or use illicit drugs.    Review of Systems   Lipids: Normal and currently not on a statin drug  Lab Results  Component Value Date   CHOL 114 10/28/2014   HDL 40.30 10/28/2014   LDLCALC 59 10/28/2014   TRIG 75.0 10/28/2014   CHOLHDL 3 10/28/2014    Eye exams: Irregular    He has mild renal insufficiency of unclear etiology with variable creatinine levels, now near normal   Lab Results  Component Value Date   CREATININE 1.66* 10/28/2014   HYPERTENSION: He has not followed up with his PCP  again as directed for blood pressure management  He thinks he is taking all his medications as directed He thinks he is taking his  clonidine 3 times a day without side effects.  Also taking Norvasc and metoprolol 100 mg   Physical Examination:  BP 130/82 mmHg  Pulse 50  Temp(Src) 97.9 F (36.6 C)  Resp 16  Ht 5\' 6"  (1.676 m)  Wt 219 lb (99.338 kg)  BMI 35.36 kg/m2  SpO2 96%   no pedal edema   ASSESSMENT/PLAN:  Diabetes type 2, uncontrolled  Blood sugars are again somewhat high despite increasing his insulin. He appears to have relatively high readings before supper and has a long interval of time between breakfast and supper. Currently not covering his lunch with any mealtime insulin even though sometimes he has significant amount of carbohydrate His last A1c was 9% using Victoza 1.8 mg and premixed insulin Not clear.  Sugars are higher with stopping Victoza but he cannot afford this now  Have recommended adding a dose of insulin at lunchtime to cover his lunch meal and improved blood sugars before supper also.  Instructions as below Not clear if he needs additional insulin at supper time since he is not monitoring postprandial readings much and only occasional fasting readings He has maintained his weight with regular exercise Consider restarting Victoza next year when he may be able to afford it    Patient Instructions  Send eye exam report  See [facility]  Check blood sugars on waking up .Marland Kitchen2-3  .Marland Kitchen times a week Also check blood sugars about 2 hours after a meal and do this after different meals by rotation  Recommended blood sugar levels on waking up is 90-130 and about 2 hours after meal is 140-180 Please bring blood sugar monitor to each visit.  INSULIN 12 IN AND 6 AT LUNCH if eating and 14 at supper      [facility] 11/24/2014, 1:08 PM   UNITS Humalog mix bid    Glucose monitoring:  done less than one time a day         Glucometer:  Accu-Chek      Blood Glucose readings 120 am us> 200  PRE-MEAL Breakfast Lunch Dinner Bedtime Overall  Glucose range:       Mean/median:         POST-MEAL PC Breakfast PC Lunch PC Dinner  Glucose range:     Mean/median:        PRE-MEAL Breakfast  afternoon  Dinner Bedtime  average   Glucose range:  151-167   172, 176   96-243  ?    169    Self-care:       Meals: 3 meals per day. Egg in am at 10 am , sometimes sandwich at lunch. His largest meal is at suppertime at about 8 pm.  Physical activity: exercise: Walking 30-40 minutes; 3/7  days, usually 7 am       Dietician visit: Most recent: 02/2014               Oral hypoglycemic drugs the patient is taking are:  none        Compliance with the medical regimen:  Fair  Retinal exam: Most recent: about a year ago .    Wt Readings from Last 3 Encounters:  11/24/14 219 lb (99.338 kg)  09/02/14 230 lb 3.2 oz (104.418 kg)  07/22/14 231 lb (104.781 kg)    Lab Results  Component Value Date   HGBA1C 8.1* 10/28/2014   HGBA1C 10.1* 07/20/2014   HGBA1C 9.0* 02/19/2014   Lab Results  Component Value Date   MICROALBUR 5.0* 10/28/2014   LDLCALC 59 10/28/2014   CREATININE 1.66* 10/28/2014   No visits with results within 1 Week(s) from this visit. Latest known visit with results is:  Lab on 10/28/2014  Component Date Value Ref Range Status  . Hgb A1c MFr Bld 10/28/2014 8.1* 4.6 - 6.5 % Final   Glycemic Control Guidelines for People with Diabetes:Non Diabetic:  <6%Goal of Therapy: <7%Additional Action Suggested:  >  8%   . Sodium 10/28/2014 138  135 - 145 mEq/L Final  . Potassium 10/28/2014 4.0  3.5 - 5.1 mEq/L Final  . Chloride 10/28/2014 110  96 - 112 mEq/L Final  . CO2 10/28/2014 26  19 - 32 mEq/L Final  . Glucose, Bld 10/28/2014 173* 70 - 99 mg/dL Final  . BUN 10/28/2014 27* 6 - 23 mg/dL Final  . Creatinine, Ser 10/28/2014 1.66* 0.40 - 1.50 mg/dL Final  . Calcium 10/28/2014 9.6  8.4 - 10.5 mg/dL Final  . GFR 10/28/2014 53.49* >60.00 mL/min Final  . Cholesterol 10/28/2014 114  0 - 200 mg/dL Final   ATP III Classification       Desirable:  < 200 mg/dL                Borderline High:  200 - 239 mg/dL          High:  > = 240 mg/dL  . Triglycerides 10/28/2014 75.0  0.0 - 149.0 mg/dL Final   Normal:  <150 mg/dLBorderline High:  150 - 199 mg/dL  . HDL 10/28/2014 40.30  >39.00 mg/dL Final  . VLDL 10/28/2014 15.0  0.0 - 40.0 mg/dL Final  . LDL Cholesterol 10/28/2014 59  0 - 99 mg/dL Final  . Total CHOL/HDL Ratio 10/28/2014 3   Final                  Men          Women1/2 Average Risk     3.4          3.3Average Risk          5.0          4.42X Average Risk          9.6          7.13X Average Risk          15.0          11.0                      . NonHDL 10/28/2014 73.70   Final   NOTE:  Non-HDL goal should be 30 mg/dL higher than patient's LDL goal (i.e. LDL goal of < 70 mg/dL, would have non-HDL goal of < 100 mg/dL)  . Microalb, Ur 10/28/2014 5.0* 0.0 - 1.9 mg/dL Final  . Creatinine,U 10/28/2014 197.6   Final  . Microalb Creat Ratio 10/28/2014 2.5  0.0 - 30.0 mg/g Final  . Color, UA 10/28/2014 Yellow   Final  . Clarity, UA 10/28/2014 Clear   Final  . Glucose, UA 10/28/2014 neg   Final  . Bilirubin, UA 10/28/2014 Neg   Final  . Ketones, UA 10/28/2014 Neg   Final  . Spec Grav, UA 10/28/2014 1.020   Final  . Blood, UA 10/28/2014 neg   Final  . pH, UA 10/28/2014 5.0   Final  . Protein, UA 10/28/2014 Trace   Final  . Urobilinogen, UA 10/28/2014 0.2   Final  . Nitrite, UA 10/28/2014 neg   Final  . Leukocytes, UA 10/28/2014 Negative  Negative Final       Medication List       This list is accurate as of: 11/24/14  1:08 PM.  Always use your most recent med list.               amLODipine 10 MG tablet  Commonly known as:  NORVASC  TAKE 1 TABLET EVERY DAY  cloNIDine 0.1 MG tablet  Commonly known as:  CATAPRES  TAKE ONE TABLET BY MOUTH ONCE DAILY     glucose blood test strip  Commonly known as:  ACCU-CHEK AVIVA PLUS  Use as instructed to check blood sugars 2 times per day dx code 250.42     Insulin Lispro Prot & Lispro (75-25) 100 UNIT/ML  Kwikpen  Commonly known as:  HUMALOG 75/25 MIX  12 Units. Inject 12 units twice a day     isosorbide mononitrate 60 MG 24 hr tablet  Commonly known as:  IMDUR  TAKE 1 TABLET TWICE DAILY     losartan 100 MG tablet  Commonly known as:  COZAAR  TAKE 1 TABLET EVERY DAY     metoprolol succinate 100 MG 24 hr tablet  Commonly known as:  TOPROL-XL  TAKE 1 TABLET TWICE DAILY  WITH  OR  IMMEDIATELY  FOLLOWING A MEAL (NEED MD APPOINTMENT FOR REFILLS)        Allergies:  Allergies  Allergen Reactions  . Hydrochlorothiazide     Gout , uncontrolled diabetes and renal insufficiency  . Allopurinol     Renal insufficiency    Past Medical History  Diagnosis Date  . Renal insufficiency   . Diabetes mellitus   . Hypertension   . Gout   . Colon polyp 2003    Dr [name]; F/U was to be 2008( not completed)  . Diverticulosis 2003  . Pneumonia 09/29/11    Avelox X 10 days as OP    Past Surgical History  Procedure Laterality Date  . Refractive surgery  2005  . Colonoscopy w/ polypectomy  2003    no F/U ([place] discussed 12/03/12)  . Wrist aspiration  02/16/12     monosodium urate crystals; Dr Caralyn Guile    Family History  Problem Relation Age of Onset  . Hypertension Father   . Prostate cancer Father   . Diabetes Father   . Hypertension Mother   . Diabetes Mother   . Stroke Neg Hx   . Heart disease Neg Hx     Social History:  reports that he quit smoking about 46 years ago. He has never used smokeless tobacco. He reports that he does not drink alcohol or use illicit drugs.    Review of Systems   Lipids: Normal and currently not on a statin drug  Lab Results  Component Value Date   CHOL 114 10/28/2014   HDL 40.30 10/28/2014   LDLCALC 59 10/28/2014   TRIG 75.0 10/28/2014   CHOLHDL 3 10/28/2014    Eye exams: Unknown    He has mild renal insufficiency of unclear etiology with variable creatinine levels, now fairly stable  Lab Results  Component Value Date   CREATININE  1.66* 10/28/2014   HYPERTENSION: He thinks he is taking all 4 medications for his hypertension but is somewhat unclear about this He thinks he is taking his losartan and clonidine  Also taking Norvasc and metoprolol 100 mg, was told to reduce metoprolol in half on the last visit because of relatively slow pulse Has not followed up with PCP  Physical Examination:  BP 130/82 mmHg  Pulse 50  Temp(Src) 97.9 F (36.6 C)  Resp 16  Ht 5\' 6"  (1.676 m)  Wt 219 lb (99.338 kg)  BMI 35.36 kg/m2  SpO2 96%  Diabetic foot exam shows normal monofilament sensation in the toes and plantar surfaces, no skin lesions or ulcers on the feet and normal pedal pulses He has significant  significant thickening of his nails with onychomycosis and relatively large big toenails Skin is relatively rough and slightly dry  No pedal edema   ASSESSMENT/PLAN:  Diabetes type 2, uncontrolled   Blood sugars are improving and he has also lost a significant amount of weight This may be related to overall improvement in lifestyle with exercise and diet Although his basic insulin regimen has been unchanged he is taking 6 units of his premixed insulin at lunchtime more regularly now as directed A1c is still not at target  Since he did not bring his blood sugar meter for download not clear what exactly his patterns aren't when he is checking them; his recall is somewhat inconsistent Most likely is having some high readings after supper and fasting, also lab fasting glucose was relatively high For now will increase his evening dose by 2 units Discussed importance of checking blood sugars after meals Reminded him to bring his monitor for download  Also he will establish with a foot care doctor for his significant onychomycosis and markedly thickened toenails  HYPERTENSION:  fairly well controlled with current regimen and he will continue  CKD: He has mild CKD, stable and will continue to follow.   Preventive Care: He  is scheduled for an eye exam in 2 weeks and will get a report of his exam   Patient Instructions  Send eye exam report  See [facility]  Check blood sugars on waking up .Marland Kitchen2-3  .Marland Kitchen times a week Also check blood sugars about 2 hours after a meal and do this after different meals by rotation  Recommended blood sugar levels on waking up is 90-130 and about 2 hours after meal is 140-180 Please bring blood sugar monitor to each visit.  INSULIN 12 IN AND 6 AT LUNCH if eating and 14 at supper    Counseling time on subjects discussed above is over 50% of today's 25 minute visit   Shameeka Silliman 11/24/2014, 1:08 PM

## 2014-11-24 NOTE — Patient Instructions (Signed)
Send eye exam report  See [facility]  Check blood sugars on waking up .Marland Kitchen2-3  .Marland Kitchen times a week Also check blood sugars about 2 hours after a meal and do this after different meals by rotation  Recommended blood sugar levels on waking up is 90-130 and about 2 hours after meal is 140-180 Please bring blood sugar monitor to each visit.  INSULIN 12 IN AND 6 AT LUNCH if eating and 14 at supper

## 2014-12-03 DIAGNOSIS — H538 Other visual disturbances: Secondary | ICD-10-CM | POA: Diagnosis not present

## 2014-12-03 DIAGNOSIS — I10 Essential (primary) hypertension: Secondary | ICD-10-CM | POA: Diagnosis not present

## 2014-12-03 DIAGNOSIS — H52223 Regular astigmatism, bilateral: Secondary | ICD-10-CM | POA: Diagnosis not present

## 2014-12-03 DIAGNOSIS — H18413 Arcus senilis, bilateral: Secondary | ICD-10-CM | POA: Diagnosis not present

## 2014-12-03 DIAGNOSIS — H5203 Hypermetropia, bilateral: Secondary | ICD-10-CM | POA: Diagnosis not present

## 2014-12-03 DIAGNOSIS — H11153 Pinguecula, bilateral: Secondary | ICD-10-CM | POA: Diagnosis not present

## 2014-12-03 DIAGNOSIS — H2513 Age-related nuclear cataract, bilateral: Secondary | ICD-10-CM | POA: Diagnosis not present

## 2014-12-03 DIAGNOSIS — H5340 Unspecified visual field defects: Secondary | ICD-10-CM | POA: Diagnosis not present

## 2014-12-09 ENCOUNTER — Other Ambulatory Visit: Payer: Self-pay | Admitting: Internal Medicine

## 2014-12-09 ENCOUNTER — Telehealth: Payer: Self-pay | Admitting: Internal Medicine

## 2014-12-09 DIAGNOSIS — H2510 Age-related nuclear cataract, unspecified eye: Secondary | ICD-10-CM

## 2014-12-09 NOTE — Telephone Encounter (Signed)
Referral made 

## 2014-12-09 NOTE — Telephone Encounter (Signed)
Mia from [facility] office stated patient need a referral, he is in the office as we speak, if she haven't heard anything for you in thirty minutes she  will have to reschedule patient. [phone]

## 2014-12-09 NOTE — Telephone Encounter (Signed)
Pt needs referral for cataract eval. Spoke with Mia from [facility] and she said that the code H25.10 could be used. Pt will reschedule once referral has been placed. Please advise

## 2014-12-14 ENCOUNTER — Other Ambulatory Visit: Payer: Self-pay | Admitting: Internal Medicine

## 2014-12-17 DIAGNOSIS — H4720 Unspecified optic atrophy: Secondary | ICD-10-CM | POA: Diagnosis not present

## 2014-12-17 DIAGNOSIS — H2513 Age-related nuclear cataract, bilateral: Secondary | ICD-10-CM | POA: Diagnosis not present

## 2014-12-21 MED ORDER — METOPROLOL SUCCINATE ER 100 MG PO TB24: 100.0000 mg | ORAL_TABLET | Freq: Two times a day (BID) | ORAL | Status: DC

## 2014-12-21 MED ORDER — ISOSORBIDE MONONITRATE ER 60 MG PO TB24: 60.0000 mg | ORAL_TABLET | Freq: Two times a day (BID) | ORAL | Status: DC

## 2014-12-21 MED ORDER — CLONIDINE HCL 0.1 MG PO TABS: 0.1000 mg | ORAL_TABLET | Freq: Every day | ORAL | Status: DC

## 2014-12-21 NOTE — Telephone Encounter (Signed)
Pt states he is needing refills on his medication metoprolol, clonidine. Pharmacy send request last week have'nt receive back. Inform pt request did come through will resend to [facility], but will need cpx in Dec for additional refills

## 2014-12-23 ENCOUNTER — Other Ambulatory Visit: Payer: Self-pay | Admitting: Ophthalmology

## 2014-12-23 DIAGNOSIS — H4720 Unspecified optic atrophy: Principal | ICD-10-CM

## 2014-12-23 DIAGNOSIS — H3550 Unspecified hereditary retinal dystrophy: Secondary | ICD-10-CM

## 2014-12-24 ENCOUNTER — Other Ambulatory Visit: Payer: Self-pay | Admitting: Ophthalmology

## 2014-12-24 DIAGNOSIS — H3550 Unspecified hereditary retinal dystrophy: Secondary | ICD-10-CM

## 2014-12-24 DIAGNOSIS — H4720 Unspecified optic atrophy: Principal | ICD-10-CM

## 2014-12-25 DIAGNOSIS — H4720 Unspecified optic atrophy: Secondary | ICD-10-CM | POA: Diagnosis not present

## 2014-12-25 DIAGNOSIS — H2513 Age-related nuclear cataract, bilateral: Secondary | ICD-10-CM | POA: Diagnosis not present

## 2015-01-07 ENCOUNTER — Ambulatory Visit
Admission: RE | Admit: 2015-01-07 | Discharge: 2015-01-07 | Disposition: A | Discharge status: Home / Self Care | Payer: Commercial Managed Care - HMO | Source: Ambulatory Visit | Attending: Ophthalmology | Admitting: Ophthalmology

## 2015-01-07 ENCOUNTER — Other Ambulatory Visit: Payer: Commercial Managed Care - HMO

## 2015-01-07 DIAGNOSIS — H3550 Unspecified hereditary retinal dystrophy: Secondary | ICD-10-CM

## 2015-01-07 DIAGNOSIS — H47213 Primary optic atrophy, bilateral: Secondary | ICD-10-CM | POA: Diagnosis not present

## 2015-01-07 DIAGNOSIS — H4720 Unspecified optic atrophy: Principal | ICD-10-CM

## 2015-01-07 MED ORDER — GADOBENATE DIMEGLUMINE 529 MG/ML IV SOLN
20.0000 mL | Freq: Once | INTRAVENOUS | Status: AC | PRN
  Administered 2015-01-07: 20 mL via INTRAVENOUS

## 2015-02-05 DIAGNOSIS — H2513 Age-related nuclear cataract, bilateral: Secondary | ICD-10-CM | POA: Diagnosis not present

## 2015-02-05 DIAGNOSIS — H4720 Unspecified optic atrophy: Secondary | ICD-10-CM | POA: Diagnosis not present

## 2015-02-14 ENCOUNTER — Encounter: Payer: Self-pay | Admitting: Internal Medicine

## 2015-02-14 DIAGNOSIS — H3550 Unspecified hereditary retinal dystrophy: Secondary | ICD-10-CM | POA: Insufficient documentation

## 2015-02-14 DIAGNOSIS — H4720 Unspecified optic atrophy: Secondary | ICD-10-CM

## 2015-02-14 DIAGNOSIS — R9089 Other abnormal findings on diagnostic imaging of central nervous system: Secondary | ICD-10-CM | POA: Insufficient documentation

## 2015-02-16 ENCOUNTER — Other Ambulatory Visit: Payer: Self-pay | Admitting: Endocrinology

## 2015-02-16 DIAGNOSIS — N182 Chronic kidney disease, stage 2 (mild): Secondary | ICD-10-CM

## 2015-02-19 ENCOUNTER — Other Ambulatory Visit (INDEPENDENT_AMBULATORY_CARE_PROVIDER_SITE_OTHER): Payer: Commercial Managed Care - HMO

## 2015-02-19 DIAGNOSIS — N182 Chronic kidney disease, stage 2 (mild): Secondary | ICD-10-CM

## 2015-02-19 DIAGNOSIS — IMO0002 Reserved for concepts with insufficient information to code with codable children: Secondary | ICD-10-CM

## 2015-02-19 DIAGNOSIS — E1165 Type 2 diabetes mellitus with hyperglycemia: Secondary | ICD-10-CM

## 2015-02-19 LAB — COMPREHENSIVE METABOLIC PANEL
ALT: 15 U/L (ref 0–53)
AST: 21 U/L (ref 0–37)
Albumin: 4 g/dL (ref 3.5–5.2)
Alkaline Phosphatase: 45 U/L (ref 39–117)
BILIRUBIN TOTAL: 0.5 mg/dL (ref 0.2–1.2)
BUN: 28 mg/dL — ABNORMAL HIGH (ref 6–23)
CHLORIDE: 109 meq/L (ref 96–112)
CO2: 26 meq/L (ref 19–32)
CREATININE: 1.75 mg/dL — AB (ref 0.40–1.50)
Calcium: 9.9 mg/dL (ref 8.4–10.5)
GFR: 50.28 mL/min — AB (ref >60.00)
GLUCOSE: 131 mg/dL — AB (ref 70–99)
Potassium: 3.9 mEq/L (ref 3.5–5.1)
Sodium: 143 mEq/L (ref 135–145)
Total Protein: 6.8 g/dL (ref 6.0–8.3)

## 2015-02-19 LAB — HEMOGLOBIN A1C: HEMOGLOBIN A1C: 7.2 % — AB (ref 4.6–6.5)

## 2015-02-20 LAB — PARATHYROID HORMONE, INTACT (NO CA): PTH: 31 pg/mL (ref 15–65)

## 2015-02-23 ENCOUNTER — Other Ambulatory Visit: Payer: Self-pay | Admitting: Internal Medicine

## 2015-02-24 ENCOUNTER — Encounter: Payer: Self-pay | Admitting: Endocrinology

## 2015-02-24 ENCOUNTER — Ambulatory Visit (INDEPENDENT_AMBULATORY_CARE_PROVIDER_SITE_OTHER): Payer: Commercial Managed Care - HMO | Admitting: Endocrinology

## 2015-02-24 VITALS — BP 178/84 | HR 47 | Temp 98.2°F | Resp 14 | Ht 66.0 in | Wt 208.6 lb

## 2015-02-24 DIAGNOSIS — Z23 Encounter for immunization: Secondary | ICD-10-CM

## 2015-02-24 DIAGNOSIS — E1165 Type 2 diabetes mellitus with hyperglycemia: Secondary | ICD-10-CM

## 2015-02-24 DIAGNOSIS — Z794 Long term (current) use of insulin: Secondary | ICD-10-CM

## 2015-02-24 NOTE — Progress Notes (Signed)
Patient ID: Corey [name], male   DOB: 1945-08-16, 69 y.o.   MRN: [id number]   Reason for Appointment : Followup of Type 2 Diabetes and hypertension  History of Present Illness          Diagnosis: Type 2 diabetes mellitus, date of diagnosis: 1997      Past history: The patient was initially treated with various oral hypoglycemic drugs and details are not available. About 2009 he was started on insulin and has either taken premixed insulin or Lantus once a day He had difficulty affording his Lantus insulin and prefers to take premixed insulin  Recent history:    INSULIN regimen is described as:  Humalog mix 12 at breakfast and 12 at suppertime  Had previously been taking insulin at lunchtime in addition to his morning and suppertime dose but now he says he is not taking it  His A1c is now  7.2% which is significantly better than the last couple  He cannot afford Victoza which previously had helped his control and his weight   However he appears to be losing weight on his own  Current blood sugar patterns and problems:  His  Blood sugars at home are excellent and he is taking them at various times    he has had only one high reading of 220 after supper medical and does check some readings after evening meal and occasionally in the morning   He does not report any hypoglycemia    He says that he is trying to walk although not as much as before and generally trying to watch his portions and type of foods  Hypoglycemia : none    Glucose monitoring:  done less than one time a day         Glucometer:  Accu-Chek      Blood Glucose readings by monitor download:  Mean values apply above for all meters except median for One Touch  PRE-MEAL Fasting Lunch Dinner Bedtime Overall  Glucose range:  96, 121   149  91-220   Mean/median:     122  120    Self-care:       Meals: 3 meals per day. Egg in am at 9-10 am , sometimes sandwich at lunch. His largest meal is at suppertime at  about 8 pm.  Physical activity: exercise: Walking 30-40 minutes; 3/7  days, usually 7 am       Dietician visit: Most recent: 02/2014               Oral hypoglycemic drugs the patient is taking are:  none        Compliance with the medical regimen:  Fair  Retinal exam: Most recent: about a year ago .    Wt Readings from Last 3 Encounters:  02/24/15 208 lb 9.6 oz (94.62 kg)  11/24/14 219 lb (99.338 kg)  09/02/14 230 lb 3.2 oz (104.418 kg)    Lab Results  Component Value Date   HGBA1C 7.2* 02/19/2015   HGBA1C 8.1* 10/28/2014   HGBA1C 10.1* 07/20/2014   Lab Results  Component Value Date   MICROALBUR 5.0* 10/28/2014   LDLCALC 59 10/28/2014   CREATININE 1.75* 02/19/2015   Appointment on 02/19/2015  Component Date Value Ref Range Status  . Hgb A1c MFr Bld 02/19/2015 7.2* 4.6 - 6.5 % Final   Glycemic Control Guidelines for People with Diabetes:Non Diabetic:  <6%Goal of Therapy: <7%Additional Action Suggested:  >8%   . Sodium 02/19/2015 143  135 - 145 mEq/L Final  . Potassium 02/19/2015 3.9  3.5 - 5.1 mEq/L Final  . Chloride 02/19/2015 109  96 - 112 mEq/L Final  . CO2 02/19/2015 26  19 - 32 mEq/L Final  . Glucose, Bld 02/19/2015 131* 70 - 99 mg/dL Final  . BUN 02/19/2015 28* 6 - 23 mg/dL Final  . Creatinine, Ser 02/19/2015 1.75* 0.40 - 1.50 mg/dL Final  . Total Bilirubin 02/19/2015 0.5  0.2 - 1.2 mg/dL Final  . Alkaline Phosphatase 02/19/2015 45  39 - 117 U/L Final  . AST 02/19/2015 21  0 - 37 U/L Final  . ALT 02/19/2015 15  0 - 53 U/L Final  . Total Protein 02/19/2015 6.8  6.0 - 8.3 g/dL Final  . Albumin 02/19/2015 4.0  3.5 - 5.2 g/dL Final  . Calcium 02/19/2015 9.9  8.4 - 10.5 mg/dL Final  . GFR 02/19/2015 50.28* >60.00 mL/min Final  . PTH 02/19/2015 31  15 - 65 pg/mL Final           Medication List       This list is accurate as of: 02/24/15  4:38 PM.  Always use your most recent med list.               amLODipine 10 MG tablet  Commonly known as:  NORVASC    TAKE 1 TABLET EVERY DAY     cloNIDine 0.1 MG tablet  Commonly known as:  CATAPRES  Take 1 tablet (0.1 mg total) by mouth daily. ---- No further refills without office visit.     glucose blood test strip  Commonly known as:  ACCU-CHEK AVIVA PLUS  Use as instructed to check blood sugars 2 times per day dx code 250.42     Insulin Lispro Prot & Lispro (75-25) 100 UNIT/ML Kwikpen  Commonly known as:  HUMALOG 75/25 MIX  12 Units. Inject 12 units twice a day     isosorbide mononitrate 60 MG 24 hr tablet  Commonly known as:  IMDUR  TAKE 1 TABLET TWICE DAILY (NEED OFFICE VISIT BEFORE ANY FURTHER REFILLS)     losartan 100 MG tablet  Commonly known as:  COZAAR  TAKE 1 TABLET EVERY DAY     metoprolol succinate 100 MG 24 hr tablet  Commonly known as:  TOPROL-XL  TAKE 1 TABLET TWICE DAILY        Allergies:  Allergies  Allergen Reactions  . Hydrochlorothiazide     Gout , uncontrolled diabetes and renal insufficiency  . Allopurinol     Renal insufficiency    Past Medical History  Diagnosis Date  . Renal insufficiency   . Diabetes mellitus   . Hypertension   . Gout   . Colon polyp 2003    Dr [name]; F/U was to be 2008( not completed)  . Diverticulosis 2003  . Pneumonia 09/29/11    Avelox X 10 days as OP    Past Surgical History  Procedure Laterality Date  . Refractive surgery  2005  . Colonoscopy w/ polypectomy  2003    no F/U ([place] discussed 12/03/12)  . Wrist aspiration  02/16/12     monosodium urate crystals; Dr Caralyn Guile    Family History  Problem Relation Age of Onset  . Hypertension Father   . Prostate cancer Father   . Diabetes Father   . Hypertension Mother   . Diabetes Mother   . Stroke Neg Hx   . Heart disease Neg Hx     Social  History:  reports that he quit smoking about 46 years ago. He has never used smokeless tobacco. He reports that he does not drink alcohol or use illicit drugs.    Review of Systems   Lipids: Normal and currently not on a  statin drug  Lab Results  Component Value Date   CHOL 114 10/28/2014   HDL 40.30 10/28/2014   LDLCALC 59 10/28/2014   TRIG 75.0 10/28/2014   CHOLHDL 3 10/28/2014    Eye exams: Irregular    He has mild renal insufficiency of unclear etiology with variable creatinine levels, now near normal   Lab Results  Component Value Date   CREATININE 1.75* 02/19/2015   HYPERTENSION: He has not followed up with his PCP  again as directed for blood pressure management  He thinks he is taking all his medications as directed He thinks he is taking his clonidine 3 times a day without side effects.  Also taking Norvasc and metoprolol 100 mg   Physical Examination:  BP 178/84 mmHg  Pulse 47  Temp(Src) 98.2 F (36.8 C)  Resp 14  Ht 5\' 6"  (1.676 m)  Wt 208 lb 9.6 oz (94.62 kg)  BMI 33.68 kg/m2  SpO2 95%   no pedal edema   ASSESSMENT/PLAN:  Diabetes type 2, uncontrolled   Blood sugars are again somewhat high despite increasing his insulin. He appears to have relatively high readings before supper and has a long interval of time between breakfast and supper. Currently not covering his lunch with any mealtime insulin even though sometimes he has significant amount of carbohydrate His last A1c was 9% using Victoza 1.8 mg and premixed insulin Not clear.  Sugars are higher with stopping Victoza but he cannot afford this now  Have recommended adding a dose of insulin at lunchtime to cover his lunch meal and improved blood sugars before supper also.  Instructions as below Not clear if he needs additional insulin at supper time since he is not monitoring postprandial readings much and only occasional fasting readings He has maintained his weight with regular exercise Consider restarting Victoza next year when he may be able to afford it    Patient Instructions  Take 2 clonidine twice daily     Alyssha Housh 02/24/2015, 4:38 PM   UNITS Humalog mix bid    Glucose monitoring:  done less  than one time a day         Glucometer:  Accu-Chek      Blood Glucose readings 120 am us> 200  PRE-MEAL Breakfast Lunch Dinner Bedtime Overall  Glucose range:       Mean/median:        POST-MEAL PC Breakfast PC Lunch PC Dinner  Glucose range:     Mean/median:        PRE-MEAL Breakfast  afternoon  Dinner Bedtime  average   Glucose range:  151-167   172, 176   96-243  ?    169    Self-care:       Meals: 3 meals per day. Egg in am at 10 am , sometimes sandwich at lunch. His largest meal is at suppertime at about 8 pm.  Physical activity: exercise: Walking 30-40 minutes; 3/7  days, usually 7 am       Dietician visit: Most recent: 02/2014               Oral hypoglycemic drugs the patient is taking are:  none        Compliance with the medical  regimen:  Fair  Retinal exam: Most recent: about a year ago .    Wt Readings from Last 3 Encounters:  02/24/15 208 lb 9.6 oz (94.62 kg)  11/24/14 219 lb (99.338 kg)  09/02/14 230 lb 3.2 oz (104.418 kg)    Lab Results  Component Value Date   HGBA1C 7.2* 02/19/2015   HGBA1C 8.1* 10/28/2014   HGBA1C 10.1* 07/20/2014   Lab Results  Component Value Date   MICROALBUR 5.0* 10/28/2014   LDLCALC 59 10/28/2014   CREATININE 1.75* 02/19/2015   Appointment on 02/19/2015  Component Date Value Ref Range Status  . Hgb A1c MFr Bld 02/19/2015 7.2* 4.6 - 6.5 % Final   Glycemic Control Guidelines for People with Diabetes:Non Diabetic:  <6%Goal of Therapy: <7%Additional Action Suggested:  >8%   . Sodium 02/19/2015 143  135 - 145 mEq/L Final  . Potassium 02/19/2015 3.9  3.5 - 5.1 mEq/L Final  . Chloride 02/19/2015 109  96 - 112 mEq/L Final  . CO2 02/19/2015 26  19 - 32 mEq/L Final  . Glucose, Bld 02/19/2015 131* 70 - 99 mg/dL Final  . BUN 02/19/2015 28* 6 - 23 mg/dL Final  . Creatinine, Ser 02/19/2015 1.75* 0.40 - 1.50 mg/dL Final  . Total Bilirubin 02/19/2015 0.5  0.2 - 1.2 mg/dL Final  . Alkaline Phosphatase 02/19/2015 45  39 - 117 U/L Final  .  AST 02/19/2015 21  0 - 37 U/L Final  . ALT 02/19/2015 15  0 - 53 U/L Final  . Total Protein 02/19/2015 6.8  6.0 - 8.3 g/dL Final  . Albumin 02/19/2015 4.0  3.5 - 5.2 g/dL Final  . Calcium 02/19/2015 9.9  8.4 - 10.5 mg/dL Final  . GFR 02/19/2015 50.28* >60.00 mL/min Final  . PTH 02/19/2015 31  15 - 65 pg/mL Final       Medication List       This list is accurate as of: 02/24/15  4:38 PM.  Always use your most recent med list.               amLODipine 10 MG tablet  Commonly known as:  NORVASC  TAKE 1 TABLET EVERY DAY     cloNIDine 0.1 MG tablet  Commonly known as:  CATAPRES  Take 1 tablet (0.1 mg total) by mouth daily. ---- No further refills without office visit.     glucose blood test strip  Commonly known as:  ACCU-CHEK AVIVA PLUS  Use as instructed to check blood sugars 2 times per day dx code 250.42     Insulin Lispro Prot & Lispro (75-25) 100 UNIT/ML Kwikpen  Commonly known as:  HUMALOG 75/25 MIX  12 Units. Inject 12 units twice a day     isosorbide mononitrate 60 MG 24 hr tablet  Commonly known as:  IMDUR  TAKE 1 TABLET TWICE DAILY (NEED OFFICE VISIT BEFORE ANY FURTHER REFILLS)     losartan 100 MG tablet  Commonly known as:  COZAAR  TAKE 1 TABLET EVERY DAY     metoprolol succinate 100 MG 24 hr tablet  Commonly known as:  TOPROL-XL  TAKE 1 TABLET TWICE DAILY        Allergies:  Allergies  Allergen Reactions  . Hydrochlorothiazide     Gout , uncontrolled diabetes and renal insufficiency  . Allopurinol     Renal insufficiency    Past Medical History  Diagnosis Date  . Renal insufficiency   . Diabetes mellitus   . Hypertension   .  Gout   . Colon polyp 2003    Dr [name]; F/U was to be 2008( not completed)  . Diverticulosis 2003  . Pneumonia 09/29/11    Avelox X 10 days as OP    Past Surgical History  Procedure Laterality Date  . Refractive surgery  2005  . Colonoscopy w/ polypectomy  2003    no F/U ([place] discussed 12/03/12)  . Wrist  aspiration  02/16/12     monosodium urate crystals; Dr Caralyn Guile    Family History  Problem Relation Age of Onset  . Hypertension Father   . Prostate cancer Father   . Diabetes Father   . Hypertension Mother   . Diabetes Mother   . Stroke Neg Hx   . Heart disease Neg Hx     Social History:  reports that he quit smoking about 46 years ago. He has never used smokeless tobacco. He reports that he does not drink alcohol or use illicit drugs.    Review of Systems   Lipids: Normal and currently not on a statin drug  Lab Results  Component Value Date   CHOL 114 10/28/2014   HDL 40.30 10/28/2014   LDLCALC 59 10/28/2014   TRIG 75.0 10/28/2014   CHOLHDL 3 10/28/2014    Eye exams: 10/16    He has mild renal insufficiency of unclear etiology with variable creatinine levels, now fairly stable  Lab Results  Component Value Date   CREATININE 1.75* 02/19/2015   HYPERTENSION: He thinks he is taking  His medications consistently  Currently is taking Norvasc , clonidine , losartan and metoprolol 100 mg  Has not followed up with PCP   last diabetic foot exam was done in 7/16  Physical Examination:  BP 178/84 mmHg  Pulse 47  Temp(Src) 98.2 F (36.8 C)  Resp 14  Ht 5\' 6"  (1.676 m)  Wt 208 lb 9.6 oz (94.62 kg)  BMI 33.68 kg/m2  SpO2 95%   blood pressure was high on second measurement also  ASSESSMENT/PLAN:  Diabetes type 2, uncontrolled   Blood sugars are improving and he has also  Been able to continue his weight loss with improved diet    Home blood sugars are looking excellent and his A1c is close to 7% now  He will continue his low-dose premixed insulin twice a day   HYPERTENSION:   Blood pressure is significantly high today   He will double the dose of his clonidine and follow-up with PCP later this month.  CKD: He has mild CKD, stable and will continue to follow.   Patient Instructions  Take 2 clonidine twice daily     Sylas Twombly 02/24/2015, 4:38 PM

## 2015-02-24 NOTE — Patient Instructions (Signed)
Take 2 clonidine twice daily

## 2015-03-08 ENCOUNTER — Other Ambulatory Visit: Payer: Self-pay | Admitting: *Deleted

## 2015-03-08 ENCOUNTER — Telehealth: Payer: Self-pay | Admitting: Endocrinology

## 2015-03-08 MED ORDER — GLUCOSE BLOOD VI STRP: ORAL_STRIP | Status: DC

## 2015-03-08 NOTE — Telephone Encounter (Signed)
rx sent

## 2015-03-08 NOTE — Telephone Encounter (Signed)
accucheck test strips called into [facility]

## 2015-04-23 ENCOUNTER — Other Ambulatory Visit: Payer: Self-pay | Admitting: Internal Medicine

## 2015-04-29 ENCOUNTER — Other Ambulatory Visit: Payer: Self-pay | Admitting: Internal Medicine

## 2015-05-04 ENCOUNTER — Ambulatory Visit (INDEPENDENT_AMBULATORY_CARE_PROVIDER_SITE_OTHER): Payer: Commercial Managed Care - HMO | Admitting: Internal Medicine

## 2015-05-04 ENCOUNTER — Encounter: Payer: Self-pay | Admitting: Gastroenterology

## 2015-05-04 ENCOUNTER — Encounter: Payer: Self-pay | Admitting: Internal Medicine

## 2015-05-04 VITALS — BP 194/98 | HR 42 | Temp 97.6°F | Resp 18 | Wt 213.0 lb

## 2015-05-04 DIAGNOSIS — I10 Essential (primary) hypertension: Secondary | ICD-10-CM

## 2015-05-04 DIAGNOSIS — Z0000 Encounter for general adult medical examination without abnormal findings: Secondary | ICD-10-CM | POA: Diagnosis not present

## 2015-05-04 DIAGNOSIS — Z23 Encounter for immunization: Secondary | ICD-10-CM | POA: Diagnosis not present

## 2015-05-04 DIAGNOSIS — N529 Male erectile dysfunction, unspecified: Secondary | ICD-10-CM | POA: Diagnosis not present

## 2015-05-04 DIAGNOSIS — E1122 Type 2 diabetes mellitus with diabetic chronic kidney disease: Secondary | ICD-10-CM

## 2015-05-04 DIAGNOSIS — Z794 Long term (current) use of insulin: Secondary | ICD-10-CM

## 2015-05-04 DIAGNOSIS — R350 Frequency of micturition: Secondary | ICD-10-CM

## 2015-05-04 DIAGNOSIS — Z1211 Encounter for screening for malignant neoplasm of colon: Secondary | ICD-10-CM

## 2015-05-04 MED ORDER — AMLODIPINE BESYLATE 10 MG PO TABS: 10.0000 mg | ORAL_TABLET | Freq: Every day | ORAL | Status: DC

## 2015-05-04 MED ORDER — LOSARTAN POTASSIUM 100 MG PO TABS: 100.0000 mg | ORAL_TABLET | Freq: Every day | ORAL | Status: DC

## 2015-05-04 MED ORDER — SILDENAFIL CITRATE 100 MG PO TABS: 50.0000 mg | ORAL_TABLET | Freq: Every day | ORAL | Status: DC | PRN

## 2015-05-04 MED ORDER — TAMSULOSIN HCL 0.4 MG PO CAPS: 0.4000 mg | ORAL_CAPSULE | Freq: Every day | ORAL | Status: DC

## 2015-05-04 NOTE — Patient Instructions (Addendum)
  Corey [name] , Thank you for taking time to come for your Medicare Wellness Visit. I appreciate your ongoing commitment to your health goals. Please review the following plan we discussed and let me know if I can assist you in the future.   These are the goals we discussed: Goals    Work on weight loss      This is a list of the screening recommended for you and due dates:  Health Maintenance  Topic Date Due  . Eye exam for diabetics  04/21/1956  . Shingles Vaccine  04/21/2008  . Colon Cancer Screening  09/27/2011  . Pneumonia vaccines (1 of 2 - PCV13) 10/02/2013  . Hemoglobin A1C  08/20/2015  . Complete foot exam   11/24/2015  . Flu Shot  12/14/2015  . Tetanus Vaccine  09/20/2021  .  Hepatitis C: One time screening is recommended by Center for Disease Control  (CDC) for  adults born from [birth year] through 1963.   Completed     We have reviewed your prior records including labs and tests today.  Test(s) ordered today. Your results will be released to [facility] (or called to you) after review, usually within 72hours after test completion. If any changes need to be made, you will be notified at that same time.  All other Health Maintenance issues reviewed.   All recommended immunizations and age-appropriate screenings are up-to-date/discussed.  Check with your insurance company regarding the shingles vaccine.  prevnar vaccine administered today.   Medications reviewed and updated.  Changes include trying a medication for your urination and a medication for erections.  Your prescription(s) have been submitted to your pharmacy. Please take as directed and contact our office if you believe you are having problem(s) with the medication(s).  Please schedule followup in 2 weeks

## 2015-05-04 NOTE — Assessment & Plan Note (Signed)
Following with endocrine Sugars fairly controlled Work on weight loss Continue current medications-has follow-up scheduled with endocrine

## 2015-05-04 NOTE — Progress Notes (Signed)
Subjective:    Patient ID: Corey [name], male    DOB: [birth year], 69 y.o.   MRN: [id number]  HPI He is here to establish with a new pcp and for a wellness visit.     Hypertension: He typically takes his medication daily, but ran out of his amlodipine and did not take it today only. He is not compliant with a low sodium diet.  He denies chest pain, palpitations, edema, shortness of breath and regular headaches. He is exercising regularly.  He is not monitoring his blood pressure now, but he did check it a couple of months ago and thinks it was 140/?  Diabetes: He is taking his medication daily as prescribed. He is compliant with a diabetic diet. He is exercising regularly. He monitors his sugars and they have been running 150 is the highest.  He is up-to-date with an ophthalmology examination.  .   Erectile dysfunction:  He is experiencing erectile dysfunction. He has never taken any meds in the past.    Frequent urination:  He denies dysuria, hematuria,   He occaisonal has difficulty initiating urination and weak stream.  His sugar this morning was 150, which is high for him.    Here for medicare wellness.   I have personally reviewed and have noted 1.The patient's medical and social history 2.Their use of alcohol, tobacco or illicit drugs 3.Their current medications and supplements 4.The patient's functional ability including ADL's, fall risks, home safety risks and hearing or visual impairment. 5.Diet and physical activities 6.Evidence for depression or mood disorders 7.Care team reviewed and updated (available in snapshot)   Are there smokers in your home (other than you)? yes-his wife  Risk Factors Exercise: exercising regularly Dietary issues discussed: not on a low sodium diet, eats low sugar/carb diet  Cardiac risk factors: advanced age (older than 68 for men, 12 for women), hypertension, hyperlipidemia,  diabetes,obesity (BMI >= 30 kg/m2).  Depression Screen  Have you felt down, depressed or hopeless? No  Have you felt little interest or pleasure in doing things?  No Activities of Daily Living In your present state of health, do you have any difficulty performing the following activities?:  Driving? Yes Managing money?  Yes Feeding yourself? Yes Getting from bed to chair? Yes Climbing a flight of stairs? Yes Preparing food and eating?: Yes Bathing or showering? Yes Getting dressed: Yes Getting to/using the toilet? Yes Moving around from place to place: Yes In the past year have you fallen or had a near fall?: no   Are you sexually active?  yes  Do you have more than one partner?  no  Hearing Difficulties:  Do you often ask people to speak up or repeat themselves? yes Do you experience ringing or noises in your ears? yes Do you have difficulty understanding soft or whispered voices? yes Vision:              Any change in vision: yes - has seen eye doctor             Up to date with eye exam: yes Memory:  Do you feel that you have a problem with memory? No  Do you often misplace items? No  Do you feel safe at home?  Yes  Cognitive Testing  Alert, Orientated? Yes  Normal Appearance? Yes  Recall of three objects?  Yes  Can perform simple calculations? Yes  Displays appropriate judgment? Yes  Can read the correct time from a watch face?  Yes   Advanced Directives have been discussed with the patient? Yes   Medications and allergies reviewed with patient and updated if appropriate.  Patient Active Problem List   Diagnosis Date Noted  . 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 Abnormal finding on MRI of brain 02/14/2015  . Leg wound, left 01/08/2013  . Cellulitis 01/08/2013  . Anemia 01/08/2013  . Feeding difficulties and mismanagement 12/10/2012  . PEA (Pulseless electrical activity) ([place]) 12/03/2012  . Type II or unspecified type diabetes  mellitus with renal manifestations, uncontrolled 06/30/2011  . Nonspecific abnormal electrocardiogram (ECG) (EKG) 12/15/2010  . ANEMIA 09/23/2009  . RENAL INSUFFICIENCY 09/23/2009  . GOUT 03/05/2007  . HYPERTENSION, ESSENTIAL NOS 03/05/2007    Current Outpatient Prescriptions on File Prior to Visit  Medication Sig Dispense Refill  . cloNIDine (CATAPRES) 0.1 MG tablet Take 1 tablet (0.1 mg total) by mouth daily. 90 tablet 0  . glucose blood (ACCU-CHEK AVIVA PLUS) test strip Use as instructed to check blood sugars 2 times per day dx code E11.29 200 each 1  . Insulin Lispro Prot & Lispro (HUMALOG 75/25 MIX) (75-25) 100 UNIT/ML Kwikpen 12 Units. Inject 12 units twice a day    . isosorbide mononitrate (IMDUR) 60 MG 24 hr tablet Take 1 tablet (60 mg total) by mouth 2 (two) times daily. 180 tablet 0  . losartan (COZAAR) 100 MG tablet TAKE 1 TABLET EVERY DAY 90 tablet 0  . metoprolol succinate (TOPROL-XL) 100 MG 24 hr tablet TAKE 1 TABLET TWICE DAILY 180 tablet 0   No current facility-administered medications on file prior to visit.    Past Medical History  Diagnosis Date  . Renal insufficiency   . Diabetes mellitus   . Hypertension   . Gout   . Colon polyp 2003    Dr [name]; F/U was to be 2008( not completed)  . Diverticulosis 2003  . Pneumonia 09/29/11    Avelox X 10 days as OP    Past Surgical History  Procedure Laterality Date  . Refractive surgery  2005  . Colonoscopy w/ polypectomy  2003    no F/U ([place] discussed 12/03/12)  . Wrist aspiration  02/16/12     monosodium urate crystals; Dr Caralyn Guile    Social History   Social History  . Marital Status: Married    Spouse Name: N/A  . Number of Children: N/A  . Years of Education: N/A   Social History Main Topics  . Smoking status: Former Smoker    Quit date: 05/15/1968  . Smokeless tobacco: Never Used     Comment: smoked [place], up to 1-2 cigarettes/ day  . Alcohol Use: No  . Drug Use: No  . Sexual Activity: Not  Currently   Other Topics Concern  . None   Social History Narrative   Walks three times a week, 2-3 miles, occasional walks on treadmill        Review of Systems  Constitutional: Negative for fever, chills and unexpected weight change.  HENT: Positive for hearing loss.   Eyes: Positive for visual disturbance (blurry vision at times).  Respiratory: Negative for cough, shortness of breath and wheezing.   Cardiovascular: Negative for chest pain, palpitations and leg swelling.  Gastrointestinal: Negative for nausea, abdominal pain, diarrhea, constipation and blood in stool.       No GERD  Genitourinary: Positive for frequency and difficulty urinating. Negative for dysuria and hematuria.  Musculoskeletal: Negative for back pain and arthralgias.  Neurological: Negative  for dizziness, weakness, light-headedness, numbness and headaches.  Psychiatric/Behavioral: Negative for dysphoric mood. The patient is nervous/anxious.        Objective:   Filed Vitals:   05/04/15 1338  BP: 194/98  Pulse: 42  Temp: 97.6 F (36.4 C)  Resp: 18   Filed Weights   05/04/15 1338  Weight: 213 lb (96.616 kg)   Body mass index is 34.4 kg/(m^2).   Physical Exam  Constitutional: He appears well-developed. No distress.  HENT:  Head: Normocephalic and atraumatic.  Right Ear: External ear normal.  Left Ear: External ear normal.  Mouth/Throat: Oropharynx is clear and moist.  Eyes: Conjunctivae are normal.  Neck: Neck supple. No tracheal deviation present. No thyromegaly present.  No carotid bruit  Cardiovascular: Normal rate, regular rhythm and normal heart sounds.   No murmur heard. Pulmonary/Chest: Effort normal and breath sounds normal. No respiratory distress. He has no wheezes. He has no rales.  Abdominal: Soft. Bowel sounds are normal. He exhibits no distension. There is no tenderness.  Genitourinary:  deferred  Musculoskeletal: He exhibits no edema.  Lymphadenopathy:    He has no cervical  adenopathy.  Skin: Skin is warm and dry. No rash noted. He is not diaphoretic.  Psychiatric: He has a normal mood and affect. His behavior is normal.          Assessment & Plan:   Wellness Screening blood work  Due for colonoscopy - referred today prevnar today, will call insurance regarding zostavax, other immunizations up to date EKG done 2 years ago, reviewed Exercise: Regular Weight loss encouraged-decreased portions Eye exam: about three months ago No skin concerns No substance abuse No evidence of depression, memory concerns for difficulty with ADLs   Erectile dysfunction, difficulty initiating urination/weak stream/urinary frequency He likely has BPH Will check PSA, testosterone levels Declined urology referral, but would like to try medication for both Start Flomax once daily Trial of Viagra as needed We will discuss urology referral at his next appointment again depending on how he responds to the medication   See problem list for assessment and plan chronic problems

## 2015-05-04 NOTE — Progress Notes (Signed)
Pre visit review using our clinic review tool, if applicable. No additional management support is needed unless otherwise documented below in the visit note. 

## 2015-05-04 NOTE — Assessment & Plan Note (Signed)
Blood pressure elevated today, but may be because he did not take the amlodipine Restart amlodipine Stressed not working on his medication Continue other medications Stressed low-sodium diet Work on weight loss Continue to monitor blood pressure Follow-up in 2 weeks

## 2015-05-05 ENCOUNTER — Telehealth: Payer: Self-pay | Admitting: Internal Medicine

## 2015-05-05 NOTE — Telephone Encounter (Signed)
Unable to leave voicemail. Tried calling pt back

## 2015-05-05 NOTE — Telephone Encounter (Signed)
Pt called state that he was in to see Dr. Senaida Ores yesterday and he thought she was going to send in his meds to [facility]. Please check, it went to [place]

## 2015-05-06 MED ORDER — AMLODIPINE BESYLATE 10 MG PO TABS: 10.0000 mg | ORAL_TABLET | Freq: Every day | ORAL | Status: DC

## 2015-05-06 NOTE — Addendum Note (Signed)
Addended by: Terence Lux B on: 05/06/2015 09:37 AM   Modules accepted: Orders

## 2015-05-06 NOTE — Telephone Encounter (Signed)
Spoke with pt. Sent over 30 day supply to [place] again.

## 2015-05-19 ENCOUNTER — Encounter: Payer: Self-pay | Admitting: Internal Medicine

## 2015-05-19 ENCOUNTER — Ambulatory Visit (INDEPENDENT_AMBULATORY_CARE_PROVIDER_SITE_OTHER): Payer: Commercial Managed Care - HMO | Admitting: Internal Medicine

## 2015-05-19 VITALS — BP 160/84 | HR 45 | Temp 97.6°F | Resp 16 | Wt 216.0 lb

## 2015-05-19 DIAGNOSIS — R3912 Poor urinary stream: Secondary | ICD-10-CM | POA: Diagnosis not present

## 2015-05-19 DIAGNOSIS — I10 Essential (primary) hypertension: Secondary | ICD-10-CM | POA: Diagnosis not present

## 2015-05-19 MED ORDER — CLONIDINE HCL 0.2 MG PO TABS: 0.2000 mg | ORAL_TABLET | Freq: Two times a day (BID) | ORAL | Status: DC

## 2015-05-19 NOTE — Progress Notes (Signed)
Subjective:    Patient ID: Corey [name], male    DOB: Aug 19, 1945, 70 y.o.   MRN: [id number]  HPI He is here for follow up of his elevated blood pressure.  His blood pressure was elevated two weeks ago at his wellness visit.  He did not take his one of his medications that morning - the medication was restarted.  He is here to make sure his blood pressure is well controlled. He is exercising regularly.  He is eating a low sugar diet.  He has not checked his BP at home.   ED, difficulty initiating urination with weak stream:  He likely has BPH and deferred urology referral.  I did prescribed flomax and viagra two weeks ago. He does not feel that the flomax has helped.    Medications and allergies reviewed with patient and updated if appropriate.  Patient Active Problem List   Diagnosis Date Noted  . Erectile dysfunction 05/04/2015  . Urinary frequency 05/04/2015  . 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 Abnormal finding on MRI of brain 02/14/2015  . Anemia 01/08/2013  . Feeding difficulties and mismanagement 12/10/2012  . PEA (Pulseless electrical activity) ([place]) 12/03/2012  . Diabetes ([place]) 06/30/2011  . Nonspecific abnormal electrocardiogram (ECG) (EKG) 12/15/2010  . ANEMIA 09/23/2009  . RENAL INSUFFICIENCY 09/23/2009  . GOUT 03/05/2007  . Essential hypertension 03/05/2007    Current Outpatient Prescriptions on File Prior to Visit  Medication Sig Dispense Refill  . amLODipine (NORVASC) 10 MG tablet Take 1 tablet (10 mg total) by mouth daily. 30 tablet 0  . cloNIDine (CATAPRES) 0.1 MG tablet Take 1 tablet (0.1 mg total) by mouth daily. 90 tablet 0  . glucose blood (ACCU-CHEK AVIVA PLUS) test strip Use as instructed to check blood sugars 2 times per day dx code E11.29 200 each 1  . Insulin Lispro Prot & Lispro (HUMALOG 75/25 MIX) (75-25) 100 UNIT/ML Kwikpen 12 Units. Inject 12 units twice a day    . isosorbide mononitrate (IMDUR) 60 MG 24 hr tablet Take 1  tablet (60 mg total) by mouth 2 (two) times daily. 180 tablet 0  . losartan (COZAAR) 100 MG tablet Take 1 tablet (100 mg total) by mouth daily. 90 tablet 1  . metoprolol succinate (TOPROL-XL) 100 MG 24 hr tablet TAKE 1 TABLET TWICE DAILY 180 tablet 0  . sildenafil (VIAGRA) 100 MG tablet Take 0.5-1 tablets (50-100 mg total) by mouth daily as needed for erectile dysfunction. 8 tablet 11  . tamsulosin (FLOMAX) 0.4 MG CAPS capsule Take 1 capsule (0.4 mg total) by mouth daily. 90 capsule 1   No current facility-administered medications on file prior to visit.    Past Medical History  Diagnosis Date  . Renal insufficiency   . Diabetes mellitus   . Hypertension   . Gout   . Colon polyp 2003    Dr [name]; F/U was to be 2008( not completed)  . Diverticulosis 2003  . Pneumonia 09/29/11    Avelox X 10 days as OP    Past Surgical History  Procedure Laterality Date  . Refractive surgery  2005  . Colonoscopy w/ polypectomy  2003    no F/U ([place] discussed 12/03/12)  . Wrist aspiration  02/16/12     monosodium urate crystals; Dr Caralyn Guile    Social History   Social History  . Marital Status: Married    Spouse Name: N/A  . Number of Children: N/A  . Years of Education: N/A  Social History Main Topics  . Smoking status: Former Smoker    Quit date: 05/15/1968  . Smokeless tobacco: Never Used     Comment: smoked [place], up to 1-2 cigarettes/ day  . Alcohol Use: No  . Drug Use: No  . Sexual Activity: Not Currently   Other Topics Concern  . Not on file   Social History Narrative   Walks three times a week, 2-3 miles, occasional walks on treadmill        Family History  Problem Relation Age of Onset  . Hypertension Father   . Prostate cancer Father   . Diabetes Father   . Hypertension Mother   . Diabetes Mother   . Stroke Neg Hx   . Heart disease Neg Hx     Review of Systems  Constitutional: Negative for fever.  Respiratory: Negative for cough, shortness of breath  and wheezing.   Cardiovascular: Negative for chest pain, palpitations and leg swelling.  Genitourinary: Positive for difficulty urinating.  Neurological: Negative for dizziness, light-headedness and headaches.       Objective:   Filed Vitals:   05/19/15 1340  BP: 160/84  Pulse: 45  Temp: 97.6 F (36.4 C)  Resp: 16   Filed Weights   05/19/15 1340  Weight: 216 lb (97.977 kg)   Body mass index is 34.88 kg/(m^2).   Physical Exam Constitutional: Appears well-developed and well-nourished. No distress.  Neck: Neck supple. No tracheal deviation present. No thyromegaly present.  No carotid bruit. No cervical adenopathy.   Cardiovascular: Normal rate, regular rhythm and normal heart sounds.   No murmur heard.  No edema. GU: prostate exam deferred - will refer to urology Pulmonary/Chest: Effort normal and breath sounds normal. No respiratory distress. No wheezes.          Assessment & Plan:   Weak urinary stream  Concern for BPH, prostate cancer needs to be ruled out Deferred exam today - needs to see urology - referred psa was ordered, but did not get blood work done Can continue flomax for now viagra as needed  See Problem List.   Follow up in 4 weeks.

## 2015-05-19 NOTE — Progress Notes (Signed)
Pre visit review using our clinic review tool, if applicable. No additional management support is needed unless otherwise documented below in the visit note. 

## 2015-05-19 NOTE — Patient Instructions (Addendum)
  We have reviewed your prior records including labs and tests today.  Medications reviewed and updated.  Changes include increasing your clonidine to 0.2 mg twice day.    Monitor your blood pressure daily.  Ideally it should be 130/80 or less.   Your prescription(s) have been submitted to your pharmacy. Please take as directed and contact our office if you believe you are having problem(s) with the medication(s).  A referral was ordered for urology.    Please schedule followup in 4 weeks

## 2015-05-19 NOTE — Assessment & Plan Note (Signed)
Stressed better BP control He is taking meds, eating a low sodium diet and exercising Advised weight loss Increase clonidine to 0.2mg  twice daily Monitor BP at home Blood work ordered two weeks ago - did not have it done Need to recheck cmp in the next month

## 2015-05-24 ENCOUNTER — Other Ambulatory Visit: Payer: Commercial Managed Care - HMO

## 2015-05-27 ENCOUNTER — Encounter: Payer: Self-pay | Admitting: Internal Medicine

## 2015-05-27 ENCOUNTER — Ambulatory Visit: Payer: Commercial Managed Care - HMO | Admitting: Endocrinology

## 2015-06-08 ENCOUNTER — Telehealth: Payer: Self-pay | Admitting: Gastroenterology

## 2015-06-08 ENCOUNTER — Telehealth: Payer: Self-pay | Admitting: *Deleted

## 2015-06-08 ENCOUNTER — Ambulatory Visit (AMBULATORY_SURGERY_CENTER): Payer: Self-pay | Admitting: *Deleted

## 2015-06-08 VITALS — Ht 66.0 in | Wt 209.6 lb

## 2015-06-08 DIAGNOSIS — Z86010 Personal history of colonic polyps: Secondary | ICD-10-CM

## 2015-06-08 MED ORDER — NA SULFATE-K SULFATE-MG SULF 17.5-3.13-1.6 GM/177ML PO SOLN: ORAL | Status: DC

## 2015-06-08 NOTE — Telephone Encounter (Signed)
[name]: pt is scheduled for 2/7 with Dr Loletha Carrow and cannot afford Suprep.  Thanks, Juliann Pulse

## 2015-06-08 NOTE — Progress Notes (Signed)
No allergies to eggs or soy. No problems with anesthesia.  Pt given Emmi instructions for colonoscopy  No oxygen use  No diet drug use  

## 2015-06-08 NOTE — Telephone Encounter (Signed)
I will put him on my list and call him when he can come pick one up.  Thanks!

## 2015-06-08 NOTE — Telephone Encounter (Signed)
Corey [name]: pt had stoke in 2014; had trach during hospital stay for about 1 month.  [facility] was closed after that.  Is he ok for [facility]?  Thanks, Juliann Pulse

## 2015-06-09 NOTE — Telephone Encounter (Signed)
Pt informed will proceed as scheduled in the [place]

## 2015-06-09 NOTE — Telephone Encounter (Signed)
Corey [name],  This pt is cleared for anesthetic care at [facility]  Thanks,  Jenny Reichmann

## 2015-06-14 NOTE — Telephone Encounter (Signed)
Spoke with patient and told him I would leave a prep up front for him to pick up.  Patient agreed.

## 2015-06-16 ENCOUNTER — Ambulatory Visit (INDEPENDENT_AMBULATORY_CARE_PROVIDER_SITE_OTHER): Payer: Commercial Managed Care - HMO | Admitting: Internal Medicine

## 2015-06-16 ENCOUNTER — Other Ambulatory Visit (INDEPENDENT_AMBULATORY_CARE_PROVIDER_SITE_OTHER): Payer: Commercial Managed Care - HMO

## 2015-06-16 ENCOUNTER — Encounter: Payer: Self-pay | Admitting: Internal Medicine

## 2015-06-16 VITALS — BP 162/70 | HR 42 | Temp 97.7°F | Resp 16 | Wt 207.0 lb

## 2015-06-16 DIAGNOSIS — N189 Chronic kidney disease, unspecified: Secondary | ICD-10-CM | POA: Diagnosis not present

## 2015-06-16 DIAGNOSIS — I10 Essential (primary) hypertension: Secondary | ICD-10-CM

## 2015-06-16 DIAGNOSIS — E1129 Type 2 diabetes mellitus with other diabetic kidney complication: Secondary | ICD-10-CM | POA: Diagnosis not present

## 2015-06-16 DIAGNOSIS — E1122 Type 2 diabetes mellitus with diabetic chronic kidney disease: Secondary | ICD-10-CM

## 2015-06-16 DIAGNOSIS — Z794 Long term (current) use of insulin: Secondary | ICD-10-CM

## 2015-06-16 DIAGNOSIS — D649 Anemia, unspecified: Secondary | ICD-10-CM

## 2015-06-16 DIAGNOSIS — R350 Frequency of micturition: Secondary | ICD-10-CM | POA: Diagnosis not present

## 2015-06-16 LAB — CBC WITH DIFFERENTIAL/PLATELET
Basophils Absolute: 0 10*3/uL (ref 0.0–0.1)
Basophils Relative: 0.8 % (ref 0.0–3.0)
EOS PCT: 3.9 % (ref 0.0–5.0)
Eosinophils Absolute: 0.2 10*3/uL (ref 0.0–0.7)
HEMATOCRIT: 40 % (ref 39.0–52.0)
HEMOGLOBIN: 12.9 g/dL — AB (ref 13.0–17.0)
LYMPHS ABS: 1.9 10*3/uL (ref 0.7–4.0)
Lymphocytes Relative: 42.3 % (ref 12.0–46.0)
MCHC: 32.1 g/dL (ref 30.0–36.0)
MCV: 80.2 fl (ref 78.0–100.0)
MONOS PCT: 12 % (ref 3.0–12.0)
Monocytes Absolute: 0.5 10*3/uL (ref 0.1–1.0)
Neutro Abs: 1.8 10*3/uL (ref 1.4–7.7)
Neutrophils Relative %: 41 % — ABNORMAL LOW (ref 43.0–77.0)
Platelets: 197 10*3/uL (ref 150.0–400.0)
RBC: 4.99 Mil/uL (ref 4.22–5.81)
RDW: 14.1 % (ref 11.5–15.5)
WBC: 4.4 10*3/uL (ref 4.0–10.5)

## 2015-06-16 LAB — HEMOGLOBIN A1C: HEMOGLOBIN A1C: 9 % — AB (ref 4.6–6.5)

## 2015-06-16 LAB — VITAMIN B12: Vitamin B-12: 668 pg/mL (ref 211–911)

## 2015-06-16 LAB — COMPREHENSIVE METABOLIC PANEL
ALBUMIN: 4 g/dL (ref 3.5–5.2)
ALT: 14 U/L (ref 0–53)
AST: 23 U/L (ref 0–37)
Alkaline Phosphatase: 50 U/L (ref 39–117)
BUN: 30 mg/dL — AB (ref 6–23)
CHLORIDE: 108 meq/L (ref 96–112)
CO2: 24 mEq/L (ref 19–32)
Calcium: 10 mg/dL (ref 8.4–10.5)
Creatinine, Ser: 1.7 mg/dL — ABNORMAL HIGH (ref 0.40–1.50)
GFR: 51.94 mL/min — ABNORMAL LOW (ref >60.00)
Glucose, Bld: 160 mg/dL — ABNORMAL HIGH (ref 70–99)
POTASSIUM: 4.3 meq/L (ref 3.5–5.1)
SODIUM: 141 meq/L (ref 135–145)
Total Bilirubin: 0.6 mg/dL (ref 0.2–1.2)
Total Protein: 7.1 g/dL (ref 6.0–8.3)

## 2015-06-16 LAB — IRON: Iron: 59 ug/dL (ref 42–165)

## 2015-06-16 LAB — FERRITIN: Ferritin: 339.1 ng/mL — ABNORMAL HIGH (ref 22.0–322.0)

## 2015-06-16 MED ORDER — VALSARTAN 320 MG PO TABS: 320.0000 mg | ORAL_TABLET | Freq: Every day | ORAL | Status: DC

## 2015-06-16 NOTE — Assessment & Plan Note (Signed)
Check cbc, iron and b12 Has colonoscopy scheduled Will see nephrology  - may be due to kidney disease

## 2015-06-16 NOTE — Assessment & Plan Note (Signed)
?   BPH  Check psa No change with flomax Has an appt with urology this month

## 2015-06-16 NOTE — Progress Notes (Signed)
Subjective:    Patient ID: Corey [name], male    DOB: 16-Jul-1945, 70 y.o.   MRN: [id number]  HPI He is here for follow up of his hypertension.  Hypertension: He is taking his medication daily. He is compliant with a low sodium diet.  He denies chest pain, palpitations, edema, regular shortness of breath ( has it occasionally only) and regular headaches. He is exercising regularly  -he walks three times a week, 2 miles.  He does not monitor his blood pressure at home.  His wife has a blood pressure cuff at home that he could use.     MRA of abdomen in 2011 showed no renal artery stenosis.   Diabetes: He is taking his medication daily as prescribed. He is compliant with a diabetic diet. He is exercising regularly - walks three days a week. He monitors his sugars and they have been controlled - 104 last night before eating. He checks her feet daily and denies foot lesions. He is up-to-date with an ophthalmology examination.   CKD:  He drinks a lot of water.  He does not take any nsaids.  He has not seen a kidney doctor in a long time.   ? BPH, weak stream, urinary frequency:  He denies urinary incontinence.  He is taking the flomax daily and denies any improvement with the medication.  He has a urology appt this month.    Anemia:  He has a history of anemia.  He does have CKD.  He is due for a colonoscopy and is scheduled to have that done next week.     Medications and allergies reviewed with patient and updated if appropriate.  Patient Active Problem List   Diagnosis Date Noted  . Erectile dysfunction 05/04/2015  . Urinary frequency 05/04/2015  . 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 Abnormal finding on MRI of brain 02/14/2015  . Anemia 01/08/2013  . Feeding difficulties and mismanagement 12/10/2012  . PEA (Pulseless electrical activity) ([place]) 12/03/2012  . Diabetes ([place]) 06/30/2011  . Nonspecific abnormal electrocardiogram (ECG) (EKG) 12/15/2010  .  ANEMIA 09/23/2009  . RENAL INSUFFICIENCY 09/23/2009  . GOUT 03/05/2007  . Essential hypertension 03/05/2007    Current Outpatient Prescriptions on File Prior to Visit  Medication Sig Dispense Refill  . amLODipine (NORVASC) 10 MG tablet Take 1 tablet (10 mg total) by mouth daily. 30 tablet 0  . cloNIDine (CATAPRES) 0.2 MG tablet Take 1 tablet (0.2 mg total) by mouth 2 (two) times daily. 180 tablet 1  . glucose blood (ACCU-CHEK AVIVA PLUS) test strip Use as instructed to check blood sugars 2 times per day dx code E11.29 200 each 1  . Insulin Lispro Prot & Lispro (HUMALOG 75/25 MIX) (75-25) 100 UNIT/ML Kwikpen 12 Units. Inject 12 units twice a day    . isosorbide mononitrate (IMDUR) 60 MG 24 hr tablet Take 1 tablet (60 mg total) by mouth 2 (two) times daily. 180 tablet 0  . losartan (COZAAR) 100 MG tablet Take 1 tablet (100 mg total) by mouth daily. 90 tablet 1  . metoprolol succinate (TOPROL-XL) 100 MG 24 hr tablet TAKE 1 TABLET TWICE DAILY 180 tablet 0  . Na Sulfate-K Sulfate-Mg Sulf (SUPREP BOWEL PREP) SOLN suprep as directed.  No substitutions 354 mL 0  . tamsulosin (FLOMAX) 0.4 MG CAPS capsule Take 1 capsule (0.4 mg total) by mouth daily. 90 capsule 1   No current facility-administered medications on file prior to visit.  Past Medical History  Diagnosis Date  . Renal insufficiency   . Diabetes mellitus   . Hypertension   . Gout   . Colon polyp 2003    Dr [name]; F/U was to be 2008( not completed)  . Diverticulosis 2003  . Pneumonia 09/29/11    Avelox X 10 days as OP  . Seizures ([place])     not treated for seizure disorder; had a seizure after stroke 2014; no seizure since then  . Cataract   . Myocardial infarction ([place]) 2014  . CVA (cerebral infarction) 2014    Past Surgical History  Procedure Laterality Date  . Refractive surgery  2005  . Colonoscopy w/ polypectomy  2003    no F/U ([place] discussed 12/03/12)  . Wrist aspiration  02/16/12     monosodium urate crystals;  Dr Caralyn Guile  . Peg placement  2014  . Peg tube removal  2014  . Tracheostomy  2014    was closed after hospital stay    Social History   Social History  . Marital Status: Married    Spouse Name: N/A  . Number of Children: N/A  . Years of Education: N/A   Social History Main Topics  . Smoking status: Former Smoker    Quit date: 05/15/1968  . Smokeless tobacco: Never Used     Comment: smoked [place], up to 1-2 cigarettes/ day  . Alcohol Use: No  . Drug Use: No  . Sexual Activity: Not Currently   Other Topics Concern  . Not on file   Social History Narrative   Walks three times a week, 2-3 miles, occasional walks on treadmill        Family History  Problem Relation Age of Onset  . Hypertension Father   . Prostate cancer Father   . Diabetes Father   . Hypertension Mother   . Diabetes Mother   . Stroke Neg Hx   . Heart disease Neg Hx   . Colon cancer Neg Hx     Review of Systems  Constitutional: Negative for fever.  HENT: Positive for hearing loss.   Respiratory: Positive for shortness of breath (occasional). Negative for cough and wheezing.   Cardiovascular: Negative for chest pain, palpitations and leg swelling.  Genitourinary: Positive for frequency.       No incontinence.  Slow stream  Neurological: Negative for light-headedness and headaches.       Objective:   Filed Vitals:   06/16/15 0944  Pulse: 42  Temp: 97.7 F (36.5 C)  Resp: 16   Filed Weights   06/16/15 0944  Weight: 207 lb (93.895 kg)   Body mass index is 33.43 kg/(m^2).   Physical Exam Constitutional: Appears well-developed and well-nourished. No distress.  Neck: Neck supple. No tracheal deviation present. No thyromegaly present.  No carotid bruit. No cervical adenopathy.   Cardiovascular: Normal rate, regular rhythm and normal heart sounds.   No murmur heard.  No edema Pulmonary/Chest: Effort normal and breath sounds normal. No respiratory distress. No wheezes.  Skin: dry,  scaly skin LE b/l     Assessment & Plan:   See Problem List for Assessment and Plan of chronic medical problems.  Blood work today  Follow up in 3 months

## 2015-06-16 NOTE — Progress Notes (Signed)
Pre visit review using our clinic review tool, if applicable. No additional management support is needed unless otherwise documented below in the visit note. 

## 2015-06-16 NOTE — Patient Instructions (Addendum)
Stop the losartan.  Continue the rest of your medications as prescribed.  Start diovan (valsartan) 320 mg daily.    Start monitoring your blood pressure at home daily.  A referral was ordered for a kidney doctor.    Follow up with me in 3 months

## 2015-06-16 NOTE — Assessment & Plan Note (Signed)
Not controlled, resistant - on 5 medications D/c losartan  Start diovan 320 mg daily Continue other medications Check cmp Had renal MRA done in 2011 and it showed no renal artery stenosis Will refer to nephrology for CKD and help with BP control

## 2015-06-16 NOTE — Assessment & Plan Note (Signed)
Lab Results  Component Value Date   HGBA1C 7.2* 02/19/2015   Continue diabetic diet and regular exercise Recheck a1c today since he is getting blood work done Continue current insulin

## 2015-06-17 ENCOUNTER — Encounter: Payer: Self-pay | Admitting: Internal Medicine

## 2015-06-17 ENCOUNTER — Other Ambulatory Visit: Payer: Self-pay | Admitting: Internal Medicine

## 2015-06-17 LAB — PSA, TOTAL AND FREE
PSA FREE PCT: 18 % — AB (ref >25)
PSA FREE: 0.82 ng/mL
PSA: 4.48 ng/mL — ABNORMAL HIGH (ref <=4.00)

## 2015-06-17 MED ORDER — INSULIN LISPRO PROT & LISPRO (75-25 MIX) 100 UNIT/ML KWIKPEN: 15.0000 [IU] | PEN_INJECTOR | Freq: Two times a day (BID) | SUBCUTANEOUS | Status: DC

## 2015-06-22 ENCOUNTER — Ambulatory Visit (AMBULATORY_SURGERY_CENTER): Payer: Commercial Managed Care - HMO | Admitting: Gastroenterology

## 2015-06-22 ENCOUNTER — Encounter: Payer: Self-pay | Admitting: Gastroenterology

## 2015-06-22 VITALS — BP 191/100 | HR 51 | Temp 97.7°F | Resp 21 | Ht 66.0 in | Wt 207.0 lb

## 2015-06-22 DIAGNOSIS — Z1211 Encounter for screening for malignant neoplasm of colon: Secondary | ICD-10-CM | POA: Diagnosis present

## 2015-06-22 DIAGNOSIS — I2510 Atherosclerotic heart disease of native coronary artery without angina pectoris: Secondary | ICD-10-CM | POA: Diagnosis not present

## 2015-06-22 DIAGNOSIS — Z86010 Personal history of colonic polyps: Secondary | ICD-10-CM

## 2015-06-22 LAB — GLUCOSE, CAPILLARY
GLUCOSE-CAPILLARY: 126 mg/dL — AB (ref 65–99)
GLUCOSE-CAPILLARY: 125 mg/dL — AB (ref 65–99)

## 2015-06-22 MED ORDER — SODIUM CHLORIDE 0.9 % IV SOLN: 500.0000 mL | INTRAVENOUS | Status: DC

## 2015-06-22 NOTE — Patient Instructions (Signed)
YOU HAD AN ENDOSCOPIC PROCEDURE TODAY AT [facility]:   Refer to the procedure report that was given to you for any specific questions about what was found during the examination.  If the procedure report does not answer your questions, please call your gastroenterologist to clarify.  If you requested that your care partner not be given the details of your procedure findings, then the procedure report has been included in a sealed envelope for you to review at your convenience later.  YOU SHOULD EXPECT: Some feelings of bloating in the abdomen. Passage of more gas than usual.  Walking can help get rid of the air that was put into your GI tract during the procedure and reduce the bloating. If you had a lower endoscopy (such as a colonoscopy or flexible sigmoidoscopy) you may notice spotting of blood in your stool or on the toilet paper. If you underwent a bowel prep for your procedure, you may not have a normal bowel movement for a few days.  Please Note:  You might notice some irritation and congestion in your nose or some drainage.  This is from the oxygen used during your procedure.  There is no need for concern and it should clear up in a day or so.  SYMPTOMS TO REPORT IMMEDIATELY:   Following lower endoscopy (colonoscopy or flexible sigmoidoscopy):  Excessive amounts of blood in the stool  Significant tenderness or worsening of abdominal pains  Swelling of the abdomen that is new, acute  Fever of 100F or higher    For urgent or emergent issues, a gastroenterologist can be reached at any hour by calling [phone].   DIET: Your first meal following the procedure should be a small meal and then it is ok to progress to your normal diet. Heavy or fried foods are harder to digest and may make you feel nauseous or bloated.  Likewise, meals heavy in dairy and vegetables can increase bloating.  Drink plenty of fluids but you should avoid alcoholic beverages for 24  hours.  ACTIVITY:  You should plan to take it easy for the rest of today and you should NOT DRIVE or use heavy machinery until tomorrow (because of the sedation medicines used during the test).    FOLLOW UP: Our staff will call the number listed on your records the next business day following your procedure to check on you and address any questions or concerns that you may have regarding the information given to you following your procedure. If we do not reach you, we will leave a message.  However, if you are feeling well and you are not experiencing any problems, there is no need to return our call.  We will assume that you have returned to your regular daily activities without incident.  If any biopsies were taken you will be contacted by phone or by letter within the next 1-3 weeks.  Please call us at [phone] if you have not heard about the biopsies in 3 weeks.    SIGNATURES/CONFIDENTIALITY: You and/or your care partner have signed paperwork which will be entered into your electronic medical record.  These signatures attest to the fact that that the information above on your After Visit Summary has been reviewed and is understood.  Full responsibility of the confidentiality of this discharge information lies with you and/or your care-partner.   Information on diverticulosis given to you today

## 2015-06-22 NOTE — Op Note (Signed)
[place]  [address] [place], [id number]   COLONOSCOPY PROCEDURE REPORT  PATIENT: Adrean, Molzahn  MR#: [id number] BIRTHDATE: 05-Jul-1945 , 61  yrs. old GENDER: male ENDOSCOPIST: Wilfrid Lund, MD PROCEDURE DATE:  06/22/2015 PROCEDURE:   Colonoscopy, screening Polyps removed today? No Recommend repeat exam, <10 yrs? Yes ASA CLASS:   Class II INDICATIONS:average risk patient for colon cancer. MEDICATIONS: Propofol 370 miligrams  DESCRIPTION OF PROCEDURE:   After the risks benefits and alternatives of the procedure were thoroughly explained, informed consent was obtained.  The digital rectal exam revealed no abnormalities of the rectum.   The LB [id number]  endoscope was introduced through the anus and advanced to the cecum, which was identified by both the appendix and ileocecal valve. No adverse events experienced.   The quality of the prep was good.  (Suprep was used)  The instrument was then slowly withdrawn as the colon was fully examined. Estimated blood loss is zero unless otherwise noted in this procedure report.      COLON FINDINGS: There was diverticulosis from the proximal ascending colon to the distal sigmoid colon. The colonic mucosa was entirely normal. No polyps were seen.  Retroflexed views revealed no abnormalities. The time to cecum = 10.3 Withdrawal time = 10.0   The scope was withdrawn and the procedure completed. COMPLICATIONS: There were no immediate complications.  ENDOSCOPIC IMPRESSION: 1.   There was diverticulosis from the proximal ascending colon to the distal sigmoid colon. The colonic mucosa was entirely normal. No polyps were seen 2.   Diverticulosis  RECOMMENDATIONS:  1.  You should continue to follow colorectal cancer screening guidelines for "routine risk" patients with a repeat colonoscopy in 10 years.  There is no need for FOBT (stool) testing for at least 5 years.  eSigned:  Wilfrid Lund, MD 06/22/2015 9:23  AM   cc: Billey Gosling, MD   PATIENT NAME:  Murilo, Pattison MR#: [id number]

## 2015-06-22 NOTE — Progress Notes (Signed)
A/ox3 pleased with MAC, report toA/ox3 pleased with MAC, report to [facility]

## 2015-06-23 ENCOUNTER — Other Ambulatory Visit: Payer: Commercial Managed Care - HMO

## 2015-06-23 ENCOUNTER — Telehealth: Payer: Self-pay

## 2015-06-23 NOTE — Telephone Encounter (Signed)
  Follow up Call-  Call back number 06/22/2015  Post procedure Call Back phone  # [phone]  Permission to leave phone message Yes    Questions answered by his wife.   Patient questions:  Do you have a fever, pain , or abdominal swelling? No. Pain Score  0 *  Have you tolerated food without any problems? Yes.    Have you been able to return to your normal activities? Yes.    Do you have any questions about your discharge instructions: Diet   No. Medications  No. Follow up visit  No.  Do you have questions or concerns about your Care? No.  Actions: * If pain score is 4 or above: No action needed, pain <4.

## 2015-06-28 ENCOUNTER — Ambulatory Visit: Payer: Commercial Managed Care - HMO | Admitting: Endocrinology

## 2015-06-28 DIAGNOSIS — R972 Elevated prostate specific antigen [PSA]: Secondary | ICD-10-CM | POA: Diagnosis not present

## 2015-06-28 DIAGNOSIS — Z0000 Encounter for general adult medical examination without abnormal findings: Secondary | ICD-10-CM | POA: Diagnosis not present

## 2015-06-28 DIAGNOSIS — N401 Enlarged prostate with lower urinary tract symptoms: Secondary | ICD-10-CM | POA: Diagnosis not present

## 2015-07-03 ENCOUNTER — Telehealth: Payer: Self-pay | Admitting: Internal Medicine

## 2015-07-03 NOTE — Telephone Encounter (Signed)
He did not read his mychart message - Corey [name],   Your psa is elevated and the urologist will evaluate this further. Your kidney function is stable. Your sugars are not controlled. Your a1c is 9.0 and was 7.2 previously. I would like you to increase your insulin to 15 units twice daily and do more with lifestyle -- lose weight, increase your exercise and do better with your diet.    Your liver tests, vitamin B12 level, blood counts and iron levels are normal.   Let me know if you have any questions or concerns.   Dr. Billey Gosling.

## 2015-07-05 ENCOUNTER — Other Ambulatory Visit: Payer: Self-pay | Admitting: Internal Medicine

## 2015-07-05 NOTE — Telephone Encounter (Signed)
Spoke with pt to inform.  

## 2015-07-08 ENCOUNTER — Telehealth: Payer: Self-pay | Admitting: Endocrinology

## 2015-07-08 NOTE — Telephone Encounter (Signed)
Left message for patient to return phone call.  

## 2015-07-08 NOTE — Telephone Encounter (Signed)
Patient called stating that he would like to switch back to the victoza   Rx: [place]    Thank you

## 2015-07-08 NOTE — Telephone Encounter (Signed)
Please first have him schedule a follow-up appointment, he did not reschedule his canceled appointment He will take 1.2 mg Victoza daily but start with 0.6 kg for the first week, 2 pens per month.

## 2015-07-08 NOTE — Telephone Encounter (Signed)
Ok to switch back to Victoza?

## 2015-07-14 DIAGNOSIS — D075 Carcinoma in situ of prostate: Secondary | ICD-10-CM | POA: Diagnosis not present

## 2015-07-14 DIAGNOSIS — R972 Elevated prostate specific antigen [PSA]: Secondary | ICD-10-CM | POA: Diagnosis not present

## 2015-07-16 ENCOUNTER — Ambulatory Visit: Payer: Commercial Managed Care - HMO | Admitting: Internal Medicine

## 2015-07-20 ENCOUNTER — Ambulatory Visit: Payer: Commercial Managed Care - HMO | Admitting: Internal Medicine

## 2015-07-21 DIAGNOSIS — R972 Elevated prostate specific antigen [PSA]: Secondary | ICD-10-CM | POA: Diagnosis not present

## 2015-07-21 DIAGNOSIS — C61 Malignant neoplasm of prostate: Secondary | ICD-10-CM | POA: Diagnosis not present

## 2015-07-21 DIAGNOSIS — Z0000 Encounter for general adult medical examination without abnormal findings: Secondary | ICD-10-CM | POA: Diagnosis not present

## 2015-07-25 ENCOUNTER — Encounter: Payer: Self-pay | Admitting: Internal Medicine

## 2015-07-25 DIAGNOSIS — C61 Malignant neoplasm of prostate: Secondary | ICD-10-CM | POA: Insufficient documentation

## 2015-07-26 ENCOUNTER — Telehealth: Payer: Self-pay | Admitting: Internal Medicine

## 2015-07-26 NOTE — Telephone Encounter (Signed)
Patient is requesting [facility] referral to be sent to Dr. Alinda Money urologist on [facility].  Patient has appointment set for 3/24.  Patient is being seen for follow up on prostate.

## 2015-08-03 NOTE — Telephone Encounter (Signed)
Mcarthur Rossetti Josem Kaufmann [id number] valid 08/06/2015 - 02/02/2016 for 6 visits

## 2015-08-06 DIAGNOSIS — C61 Malignant neoplasm of prostate: Secondary | ICD-10-CM | POA: Diagnosis not present

## 2015-08-06 DIAGNOSIS — Z0000 Encounter for general adult medical examination without abnormal findings: Secondary | ICD-10-CM | POA: Diagnosis not present

## 2015-08-09 ENCOUNTER — Ambulatory Visit: Payer: Commercial Managed Care - HMO | Admitting: Radiation Oncology

## 2015-08-10 ENCOUNTER — Telehealth: Payer: Self-pay | Admitting: Radiation Oncology

## 2015-08-10 NOTE — Telephone Encounter (Signed)
Phoned patient's wife. Explained that Dr. Tammi Klippel has to cover another clinic and requested to move appointment to Monday. Agreed. Moved to Monday, April 3 at 1330. [name], wife, confirmed.

## 2015-08-11 ENCOUNTER — Ambulatory Visit: Payer: Commercial Managed Care - HMO | Admitting: Radiation Oncology

## 2015-08-11 ENCOUNTER — Ambulatory Visit: Payer: Commercial Managed Care - HMO

## 2015-08-16 ENCOUNTER — Ambulatory Visit
Admission: RE | Admit: 2015-08-16 | Discharge: 2015-08-16 | Disposition: A | Discharge status: Home / Self Care | Payer: Commercial Managed Care - HMO | Source: Ambulatory Visit | Attending: Radiation Oncology | Admitting: Radiation Oncology

## 2015-08-16 ENCOUNTER — Telehealth: Payer: Self-pay | Admitting: *Deleted

## 2015-08-16 ENCOUNTER — Ambulatory Visit: Payer: Commercial Managed Care - HMO | Admitting: Radiation Oncology

## 2015-08-16 ENCOUNTER — Encounter: Payer: Self-pay | Admitting: Radiation Oncology

## 2015-08-16 VITALS — BP 179/96 | HR 54 | Resp 16 | Ht 66.0 in | Wt 205.9 lb

## 2015-08-16 DIAGNOSIS — C61 Malignant neoplasm of prostate: Secondary | ICD-10-CM

## 2015-08-16 HISTORY — DX: Malignant neoplasm of prostate: C61

## 2015-08-16 NOTE — Progress Notes (Signed)
GU Location of Tumor / Histology: prostatic adenocarcinoma  If Prostate Cancer, Gleason Score is (4 + 3) and PSA is (4.48) on 06/16/15  [name] was referred by his PCP, Dr. Quay Burow, in February for further evaluation of BPH and ED.  Biopsies of prostate (if applicable) revealed:    Past/Anticipated interventions by urology, if any: prostate biopsy, referral to radiation oncology. No CT of abdomen has been done or bone scan.   Past/Anticipated interventions by medical oncology, if any: no  Weight changes, if any: no  Bowel/Bladder complaints, if any: urgency, frequency, nocturia x 3, intermittent and weak flow. Taking rapaflo. Denies hematuria or dysuria. Reports rarely he leaks urine but, denies any incontinence.   Nausea/Vomiting, if any: no  Pain issues, if any:  no  SAFETY ISSUES:  Prior radiation? no  Pacemaker/ICD? no  Possible current pregnancy? no  Is the patient on methotrexate? no  Current Complaints / other details:  70 year old male. Father had prostate ca.

## 2015-08-16 NOTE — Telephone Encounter (Signed)
CALLED PATIENT TO INFORM OF 4TH XOFIGO ON 09-09-15 - ARRIVAL TIME - 12:15 PM @ [facility] AND HIS LAB AND WEIGHT ON 09-02-15 - @ 12:15 PM, SPOKE WITH PATIENT AND HE IS AWARE OF THESE APPTS,

## 2015-08-16 NOTE — Progress Notes (Signed)
See progress note under physician encounter. 

## 2015-08-16 NOTE — Progress Notes (Signed)
Radiation Oncology         [phone] ________________________________  Initial Outpatient Consultation  Name: Corey [name] MRN: [id number]  Date: 08/16/2015  DOB: 1945-11-29  [id number] Lorretta Harp, MD  [name], MD   REFERRING PHYSICIAN: [name], MD  DIAGNOSIS: 69 y.o. gentleman with stage T1c adenocarcinoma of the prostate with a Gleason's score of 4+3 and a PSA of 4.48    ICD-9-CM ICD-10-CM   1. Malignant neoplasm of prostate ([place]) 185 C61   2. Prostate cancer ([place]) [place] ILLNESS::Corey [name] is a 70 y.o. gentleman. He was noted to have an elevated PSA of 4.48 by his primary care physician, Dr. Quay Burow.  Accordingly, he was referred for evaluation in urology by Dr. Alinda Money on 08/06/2015. The rectal exam revealed no palpable mass. The patient proceeded to transrectal ultrasound with 12 biopsies of the prostate on 07/14/2015.  The prostate volume measured 35.11 cc.  Out of 12 core biopsies,4 were positive.  The maximum Gleason score was 4+3, and this was seen in the right apex, the right base lateral, the right mid lateal, and right apex lateral section. He is accompanied today by his wife.  The patient reviewed the biopsy results with his urologist and he has kindly been referred today for discussion of potential radiation treatment options.  PREVIOUS RADIATION THERAPY: No  PAST MEDICAL HISTORY:  has a past medical history of Renal insufficiency; Diabetes mellitus; Hypertension; Gout; Colon polyp (2003); Diverticulosis (2003); Pneumonia (09/29/11); Seizures ([place]); Cataract; Myocardial infarction [facility]) (2014); CVA (cerebral infarction) (2014); and Prostate cancer ([place]).    PAST SURGICAL HISTORY: Past Surgical History  Procedure Laterality Date  . Refractive surgery  2005  . Colonoscopy w/ polypectomy  2003    no F/U ([place] discussed 12/03/12)  . Wrist aspiration  02/16/12     monosodium urate crystals; Dr Caralyn Guile  . Peg placement  2014  . Peg tube removal   2014  . Tracheostomy  2014    was closed after hospital stay  . Prostate biopsy      FAMILY HISTORY: family history includes Cancer in his father; Diabetes in his father and mother; Hypertension in his father and mother; Prostate cancer in his father. There is no history of Stroke, Heart disease, or Colon cancer.  SOCIAL HISTORY:  reports that he quit smoking about 47 years ago. His smoking use included Cigarettes. He has a .25 pack-year smoking history. He has never used smokeless tobacco. He reports that he does not drink alcohol or use illicit drugs.  ALLERGIES: Hydrochlorothiazide and Allopurinol  MEDICATIONS:  Current Outpatient Prescriptions  Medication Sig Dispense Refill  . amLODipine (NORVASC) 10 MG tablet Take 1 tablet (10 mg total) by mouth daily. 30 tablet 0  . cloNIDine (CATAPRES) 0.2 MG tablet Take 1 tablet (0.2 mg total) by mouth 2 (two) times daily. 180 tablet 1  . glucose blood (ACCU-CHEK AVIVA PLUS) test strip Use as instructed to check blood sugars 2 times per day dx code E11.29 200 each 1  . Insulin Lispro Prot & Lispro (HUMALOG 75/25 MIX) (75-25) 100 UNIT/ML Kwikpen Inject 15 Units into the skin 2 (two) times daily. 20 mL   . isosorbide mononitrate (IMDUR) 60 MG 24 hr tablet Take 1 tablet (60 mg total) by mouth 2 (two) times daily. 180 tablet 0  . losartan (COZAAR) 100 MG tablet     . metoprolol succinate (TOPROL-XL) 100 MG 24 hr tablet TAKE  1 TABLET TWICE DAILY 180 tablet 0  . silodosin (RAPAFLO) 4 MG CAPS capsule Take 4 mg by mouth daily with breakfast.    . valsartan (DIOVAN) 320 MG tablet Take 1 tablet (320 mg total) by mouth daily. 90 tablet 1   No current facility-administered medications for this encounter.    REVIEW OF SYSTEMS:  A 15 point review of systems is documented in the electronic medical record. This was obtained by the nursing staff. However, I reviewed this with the patient to discuss relevant findings and make appropriate changes.  Pertinent items  are noted in HPI..  The patient completed an IPSS and IIEF questionnaire.  His IPSS score was 11 indicating moderate urinary outflow obstructive symptoms.  He indicated that his erectile function is able to complete sexual activity about half the time.  He mentions that he has urgency, frequency, nocturia x3, and intermittent and weak flow. He is currently taking rapaflo. He denies hematuria or dysuria. He reports he rarely leaks urine but denies any incontinence. He denies nausea and vomiting. He denies pain.   PHYSICAL EXAM: This patient is in no acute distress.  He is alert and oriented.   height is 5\' 6"  (1.676 m) and weight is 205 lb 14.4 oz (93.396 kg). His blood pressure is 179/96 and his pulse is 54. His respiration is 16 and oxygen saturation is 100%.  He exhibits no respiratory distress or labored breathing.  He appears neurologically intact.  His mood is pleasant.  His affect is appropriate.  Please note the digital rectal exam findings described above.  KPS = 100  100 - Normal; no complaints; no evidence of disease. 90   - Able to carry on normal activity; minor signs or symptoms of disease. 80   - Normal activity with effort; some signs or symptoms of disease. 66   - Cares for self; unable to carry on normal activity or to do active work. 60   - Requires occasional assistance, but is able to care for most of his personal needs. 50   - Requires considerable assistance and frequent medical care. 16   - Disabled; requires special care and assistance. 34   - Severely disabled; hospital admission is indicated although death not imminent. 76   - Very sick; hospital admission necessary; active supportive treatment necessary. 10   - Moribund; fatal processes progressing rapidly. 0     - Dead  Karnofsky DA, Abelmann [place], Craver LS and Burchenal [facility] [phone]) The use of the nitrogen mustards in the palliative treatment of carcinoma: with particular reference to bronchogenic carcinoma Cancer 1  [id number]   LABORATORY DATA:  Lab Results  Component Value Date   WBC 4.4 06/16/2015   HGB 12.9* 06/16/2015   HCT 40.0 06/16/2015   MCV 80.2 06/16/2015   PLT 197.0 06/16/2015   Lab Results  Component Value Date   NA 141 06/16/2015   K 4.3 06/16/2015   CL 108 06/16/2015   CO2 24 06/16/2015   Lab Results  Component Value Date   ALT 14 06/16/2015   AST 23 06/16/2015   ALKPHOS 50 06/16/2015   BILITOT 0.6 06/16/2015     RADIOGRAPHY: No results found.    IMPRESSION: This gentleman is a 70 yo male with adenocarcinoma of the prostate.  His T-Stage, Gleason's Score, and PSA put him into the intermediate risk group.  Accordingly he is eligible for a variety of potential treatment options including surgery, external radiation, or external radiation with seed implant  boost.  PLAN: I filled out a patient counseling form for him outlining his disease characteristics with relevant treatment diagrams and a thorough delineation of radiation including the logistics. The counseling form highlighted our discussion on the potential side effects associated with radiation therapy. The patient was given the form and we retained a copy for our records.   Today I reviewed the findings and workup thus far.  We discussed the natural history of prostate cancer.  We reviewed the the implications of T-stage, Gleason's Score, and PSA on decision-making and outcomes in prostate cancer. We compared and contrasted each of these approaches and also compared these against prostatectomy.  The patient expressed interest in external beam radiotherapy.   The patient would like to proceed with prostate IMRT.  I will share my findings with Dr. Gaynelle Arabian and move forward with scheduling placement of three gold fiducial markers into the prostate to proceed with IMRT in the near future.  His wife is having knee replacement, so, we will try to work around her availability (in 2 months) or son's availability (every Wednesday) to  get things set up.  I enjoyed meeting with him today, and will look forward to participating in the care of this very nice gentleman.    I spent 40 minutes face to face with the patient and more than 50% of that time was spent in counseling and/or coordination of care.   ------------------------------------------------  Sheral Apley. Tammi Klippel, M.D.    This document serves as a record of services personally performed by Tyler Pita, MD. It was created on his behalf by Lendon Collar, a trained medical scribe. The creation of this record is based on the scribe's personal observations and the provider's statements to them. This document has been checked and approved by the attending provider.

## 2015-08-16 NOTE — Telephone Encounter (Signed)
Called patient to inform of appt. For gold seed placement on 12-07-15- arrival time - 1 pm @ Dr. Arlyn Leak Office and his sim on 12-10-15 @ 9 am with Dr. Tammi Klippel, spoke with patient's wife and she is aware of these appts.

## 2015-09-09 ENCOUNTER — Encounter (HOSPITAL_COMMUNITY): Payer: Self-pay | Admitting: Emergency Medicine

## 2015-09-09 ENCOUNTER — Ambulatory Visit (HOSPITAL_COMMUNITY)
Admission: EM | Admit: 2015-09-09 | Discharge: 2015-09-09 | Disposition: A | Discharge status: Home / Self Care | Payer: Commercial Managed Care - HMO | Attending: Emergency Medicine | Admitting: Emergency Medicine

## 2015-09-09 DIAGNOSIS — K429 Umbilical hernia without obstruction or gangrene: Secondary | ICD-10-CM

## 2015-09-09 NOTE — Discharge Instructions (Signed)
Hernia, Adult Read these instructions carefully. Should you have any of these symptoms as we discussed and written as below sig medical attention promptly. Care to the emergency department. Otherwise, see your PCP as soon as possible to obtain a referral to a surgeon for hernia repair. A hernia is the bulging of an organ or tissue through a weak spot in the muscles of the abdomen (abdominal wall). Hernias develop most often near the navel or groin. There are many kinds of hernias. Common kinds include:  Femoral hernia. This kind of hernia develops under the groin in the upper thigh area.  Inguinal hernia. This kind of hernia develops in the groin or scrotum.  Umbilical hernia. This kind of hernia develops near the navel.  Hiatal hernia. This kind of hernia causes part of the stomach to be pushed up into the chest.  Incisional hernia. This kind of hernia bulges through a scar from an abdominal surgery. CAUSES This condition may be caused by:  Heavy lifting.  Coughing over a long period of time.  Straining to have a bowel movement.  An incision made during an abdominal surgery.  A birth defect (congenital defect).  Excess weight or obesity.  Smoking.  Poor nutrition.  Cystic fibrosis.  Excess fluid in the abdomen.  Undescended testicles. SYMPTOMS Symptoms of a hernia include:  A lump on the abdomen. This is the first sign of a hernia. The lump may become more obvious with standing, straining, or coughing. It may get bigger over time if it is not treated or if the condition causing it is not treated.  Pain. A hernia is usually painless, but it may become painful over time if treatment is delayed. The pain is usually dull and may get worse with standing or lifting heavy objects. Sometimes a hernia gets tightly squeezed in the weak spot (strangulated) or stuck there (incarcerated) and causes additional symptoms. These symptoms may  include:  Vomiting.  Nausea.  Constipation.  Irritability. DIAGNOSIS A hernia may be diagnosed with:  A physical exam. During the exam your health care provider may ask you to cough or to make a specific movement, because a hernia is usually more visible when you move.  Imaging tests. These can include:  X-rays.  Ultrasound.  CT scan. TREATMENT A hernia that is small and painless may not need to be treated. A hernia that is large or painful may be treated with surgery. Inguinal hernias may be treated with surgery to prevent incarceration or strangulation. Strangulated hernias are always treated with surgery, because lack of blood to the trapped organ or tissue can cause it to die. Surgery to treat a hernia involves pushing the bulge back into place and repairing the weak part of the abdomen. HOME CARE INSTRUCTIONS  Avoid straining.  Do not lift anything heavier than 10 lb (4.5 kg).  Lift with your leg muscles, not your back muscles. This helps avoid strain.  When coughing, try to cough gently.  Prevent constipation. Constipation leads to straining with bowel movements, which can make a hernia worse or cause a hernia repair to break down. You can prevent constipation by:  Eating a high-fiber diet that includes plenty of fruits and vegetables.  Drinking enough fluids to keep your urine clear or pale yellow. Aim to drink 6-8 glasses of water per day.  Using a stool softener as directed by your health care provider.  Lose weight, if you are overweight.  Do not use any tobacco products, including cigarettes, chewing tobacco,  or electronic cigarettes. If you need help quitting, ask your health care provider.  Keep all follow-up visits as directed by your health care provider. This is important. Your health care provider may need to monitor your condition. SEEK MEDICAL CARE IF:  You have swelling, redness, and pain in the affected area.  Your bowel habits change. SEEK  IMMEDIATE MEDICAL CARE IF:  You have a fever.  You have abdominal pain that is getting worse.  You feel nauseous or you vomit.  You cannot push the hernia back in place by gently pressing on it while you are lying down.  The hernia:  Changes in shape or size.  Is stuck outside the abdomen.  Becomes discolored.  Feels hard or tender.   This information is not intended to replace advice given to you by your health care provider. Make sure you discuss any questions you have with your health care provider.   Document Released: 05/01/2005 Document Revised: 05/22/2014 Document Reviewed: 03/11/2014 Elsevier Interactive Patient Education [employer].

## 2015-09-09 NOTE — ED Notes (Signed)
Reports having hernia for 2 years, just recently started hurting.

## 2015-09-09 NOTE — ED Provider Notes (Signed)
CSN: [id number]     Arrival date & time 09/09/15  1408 History   First MD Initiated Contact with Patient 09/09/15 1505     Chief Complaint  Patient presents with  . Hernia   (Consider location/radiation/quality/duration/timing/severity/associated sxs/prior Treatment) HPI Comments: 70 year old male is accompanied by his son s here due topain of an abdominal hernia. He states he has had this hernia for 2-3 years. It occasionally will "pop out" and he is able to press down on it and put it back in place. Yesterday it "popped out" and he was having some associated pain. He tried to reduce it but was unable to. At some point just prior to or after arrival the hernia reduced itself.He is no longer complaining of pain. He never had fever, nausea or vomiting or diarrhea. No discoloration over the abdomen. No change in appetite.   Past Medical History  Diagnosis Date  . Renal insufficiency   . Diabetes mellitus   . Hypertension   . Gout   . Colon polyp 2003    Dr [name]; F/U was to be 2008( not completed)  . Diverticulosis 2003  . Pneumonia 09/29/11    Avelox X 10 days as OP  . Seizures ([place])     not treated for seizure disorder; had a seizure after stroke 2014; no seizure since then  . Cataract   . Myocardial infarction ([place]) 2014  . CVA (cerebral infarction) 2014  . Prostate cancer [facility])    Past Surgical History  Procedure Laterality Date  . Refractive surgery  2005  . Colonoscopy w/ polypectomy  2003    no F/U ([place] discussed 12/03/12)  . Wrist aspiration  02/16/12     monosodium urate crystals; Dr Caralyn Guile  . Peg placement  2014  . Peg tube removal  2014  . Tracheostomy  2014    was closed after hospital stay  . Prostate biopsy     Family History  Problem Relation Age of Onset  . Hypertension Father   . Prostate cancer Father   . Diabetes Father   . Cancer Father   . Hypertension Mother   . Diabetes Mother   . Stroke Neg Hx   . Heart disease Neg Hx   . Colon cancer Neg  Hx    Social History  Substance Use Topics  . Smoking status: Former Smoker -- 0.25 packs/day for 1 years    Types: Cigarettes    Quit date: 05/15/1968  . Smokeless tobacco: Never Used     Comment: smoked [place], up to 1-2 cigarettes/ day  . Alcohol Use: No    Review of Systems  Constitutional: Negative.   Respiratory: Negative.  Negative for cough, chest tightness and shortness of breath.   Cardiovascular: Negative for chest pain.  Gastrointestinal: Negative for nausea, vomiting and diarrhea.       As per history of present illness  Musculoskeletal: Negative.   Skin: Negative.   Neurological: Negative.   All other systems reviewed and are negative.   Allergies  Hydrochlorothiazide and Allopurinol  Home Medications   Prior to Admission medications   Medication Sig Start Date End Date Taking? Authorizing Provider  amLODipine (NORVASC) 10 MG tablet Take 1 tablet (10 mg total) by mouth daily. 05/06/15   [name], MD  cloNIDine (CATAPRES) 0.2 MG tablet Take 1 tablet (0.2 mg total) by mouth 2 (two) times daily. 05/19/15   [name], MD  glucose blood (ACCU-CHEK AVIVA PLUS) test strip Use  as instructed to check blood sugars 2 times per day dx code E11.29 03/08/15   Elayne Snare, MD  Insulin Lispro Prot & Lispro (HUMALOG 75/25 MIX) (75-25) 100 UNIT/ML Kwikpen Inject 15 Units into the skin 2 (two) times daily. 06/17/15   [name], MD  isosorbide mononitrate (IMDUR) 60 MG 24 hr tablet Take 1 tablet (60 mg total) by mouth 2 (two) times daily. 04/23/15   [name], MD  losartan (COZAAR) 100 MG tablet  06/30/15   Historical Provider, MD  metoprolol succinate (TOPROL-XL) 100 MG 24 hr tablet TAKE 1 TABLET TWICE DAILY 04/23/15   [name], MD  silodosin (RAPAFLO) 4 MG CAPS capsule Take 4 mg by mouth daily with breakfast.    Historical Provider, MD  valsartan (DIOVAN) 320 MG tablet Take 1 tablet (320 mg total) by mouth daily. 06/16/15   [name], MD   Meds Ordered and  Administered this Visit  Medications - No data to display  BP 196/79 mmHg  Pulse 54  Temp(Src) 98.7 F (37.1 C) (Oral)  Resp 16  SpO2 100% No data found.   Physical Exam  Constitutional: He is oriented to person, place, and time. He appears well-developed and well-nourished. No distress.  Eyes: EOM are normal.  Neck: Normal range of motion. Neck supple.  Cardiovascular: Normal rate and regular rhythm.   Pulmonary/Chest: Effort normal. No respiratory distress.  Abdominal: Soft. Bowel sounds are normal. He exhibits no distension and no mass. There is no tenderness. There is no rebound and no guarding.  Abdominal exam unremarkable. No visible or palpable mass or hernia.no tenderness. No overlying discoloration or increase in warmth.  Musculoskeletal: He exhibits no edema.  Neurological: He is alert and oriented to person, place, and time. He exhibits normal muscle tone.  Skin: Skin is warm and dry.  Psychiatric: He has a normal mood and affect.  Nursing note and vitals reviewed.   ED Course  Procedures (including critical care time)  Labs Review Labs Reviewed - No data to display  Imaging Review No results found.   Visual Acuity Review  Right Eye Distance:   Left Eye Distance:   Bilateral Distance:    Right Eye Near:   Left Eye Near:    Bilateral Near:         MDM   1. Umbilical hernia without obstruction and without gangrene    On examination no palpable hernia. It apparently self reduced prior to examination. Patient is having no current pain No associated symptoms. Read these instructions carefully. Should you have any of these symptoms as we discussed and written as below sig medical attention promptly. Care to the emergency department. Otherwise, see your PCP as soon as possible to obtain a referral to a surgeon for hernia repair.     Janne Napoleon, NP 09/09/15 1614

## 2015-09-15 ENCOUNTER — Other Ambulatory Visit (INDEPENDENT_AMBULATORY_CARE_PROVIDER_SITE_OTHER): Payer: Commercial Managed Care - HMO

## 2015-09-15 ENCOUNTER — Other Ambulatory Visit: Payer: Self-pay | Admitting: Internal Medicine

## 2015-09-15 ENCOUNTER — Encounter: Payer: Self-pay | Admitting: Internal Medicine

## 2015-09-15 ENCOUNTER — Ambulatory Visit (INDEPENDENT_AMBULATORY_CARE_PROVIDER_SITE_OTHER): Payer: Commercial Managed Care - HMO | Admitting: Internal Medicine

## 2015-09-15 VITALS — BP 150/82 | HR 43 | Temp 97.8°F | Resp 16 | Wt 205.0 lb

## 2015-09-15 DIAGNOSIS — E1122 Type 2 diabetes mellitus with diabetic chronic kidney disease: Secondary | ICD-10-CM | POA: Diagnosis not present

## 2015-09-15 DIAGNOSIS — K429 Umbilical hernia without obstruction or gangrene: Secondary | ICD-10-CM | POA: Diagnosis not present

## 2015-09-15 DIAGNOSIS — I10 Essential (primary) hypertension: Secondary | ICD-10-CM

## 2015-09-15 DIAGNOSIS — Z794 Long term (current) use of insulin: Secondary | ICD-10-CM

## 2015-09-15 DIAGNOSIS — N189 Chronic kidney disease, unspecified: Secondary | ICD-10-CM

## 2015-09-15 DIAGNOSIS — C61 Malignant neoplasm of prostate: Secondary | ICD-10-CM

## 2015-09-15 DIAGNOSIS — N1832 Chronic kidney disease, stage 3b: Secondary | ICD-10-CM | POA: Insufficient documentation

## 2015-09-15 DIAGNOSIS — K439 Ventral hernia without obstruction or gangrene: Secondary | ICD-10-CM | POA: Insufficient documentation

## 2015-09-15 LAB — LIPID PANEL
CHOLESTEROL: 141 mg/dL (ref 0–200)
HDL: 49.6 mg/dL (ref >39.00)
LDL CALC: 80 mg/dL (ref 0–99)
NonHDL: 91.56
TRIGLYCERIDES: 56 mg/dL (ref 0.0–149.0)
Total CHOL/HDL Ratio: 3
VLDL: 11.2 mg/dL (ref 0.0–40.0)

## 2015-09-15 LAB — COMPREHENSIVE METABOLIC PANEL
ALT: 10 U/L (ref 0–53)
AST: 15 U/L (ref 0–37)
Albumin: 4.1 g/dL (ref 3.5–5.2)
Alkaline Phosphatase: 38 U/L — ABNORMAL LOW (ref 39–117)
BUN: 25 mg/dL — ABNORMAL HIGH (ref 6–23)
CHLORIDE: 111 meq/L (ref 96–112)
CO2: 26 meq/L (ref 19–32)
CREATININE: 1.69 mg/dL — AB (ref 0.40–1.50)
Calcium: 9.7 mg/dL (ref 8.4–10.5)
GFR: 52.25 mL/min — AB (ref >60.00)
Glucose, Bld: 157 mg/dL — ABNORMAL HIGH (ref 70–99)
Potassium: 4.3 mEq/L (ref 3.5–5.1)
Sodium: 141 mEq/L (ref 135–145)
Total Bilirubin: 0.5 mg/dL (ref 0.2–1.2)
Total Protein: 6.7 g/dL (ref 6.0–8.3)

## 2015-09-15 LAB — HEMOGLOBIN A1C: Hgb A1c MFr Bld: 8.3 % — ABNORMAL HIGH (ref 4.6–6.5)

## 2015-09-15 MED ORDER — CLONIDINE HCL 0.3 MG PO TABS: 0.3000 mg | ORAL_TABLET | Freq: Two times a day (BID) | ORAL | Status: DC

## 2015-09-15 NOTE — Assessment & Plan Note (Signed)
Slight tenderness on exam today, but reducible He may need to have surgery for this in the near future, but would like to get through his prostate cancer surgery first Advised him to call if he starts to experience pain

## 2015-09-15 NOTE — Assessment & Plan Note (Addendum)
Taking medication, not controlled Increase clonidine to 0.3 mg twice daily Continue diovan 320mg , metoprolol 100mg , clonidine 0.3 mg twice daily and imdur 60 mg twice daily Will see nephrology this month Advised him to work on weight loss Encouraged him to start monitoring his blood pressure at home

## 2015-09-15 NOTE — Assessment & Plan Note (Addendum)
Has an appointment with nephrology this month CMP today

## 2015-09-15 NOTE — Assessment & Plan Note (Signed)
Sugars at home seem to be controlled Depending on A1c may need to do 2 hour postprandial sugars Check A1c today Check microalbumin

## 2015-09-15 NOTE — Progress Notes (Signed)
Subjective:    Patient ID: Corey [name], male    DOB: November 11, 1945, 70 y.o.   MRN: [id number]  HPI He is here for follow up.  Hypertension: He is taking his medication daily. He is compliant with a low sodium diet.  He denies chest pain, palpitations, edema, shortness of breath and regular headaches. He is exercising regularly - walking three times a week.  He does not monitor his blood pressure at home.    Diabetes: He is taking his medication daily as prescribed. He is compliant with a diabetic diet. He is exercising regularly - walking three times a week. He monitors his sugars and they have been running 117, 125. He checks his feet daily and denies foot lesions. He is up-to-date with an ophthalmology examination.   Chronic kidney disease:  He has an appointment for nephrology this month.  He drinks plenty of water.    Umbilical hernia:  He has a umbilical hernia.  It hurt for the first time the other day and went to urgent care.  He never had pain prior to this. He would like to avoid surgery if possible.  Prostate cancer: Since he was here last he was diagnosed with prostate cancer. He will be having surgery soon for the prostate.  Medications and allergies reviewed with patient and updated if appropriate.  Patient Active Problem List   Diagnosis Date Noted  . Prostate cancer ([place]) 07/25/2015  . Erectile dysfunction 05/04/2015  . Urinary frequency 05/04/2015  . 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 Abnormal finding on MRI of brain 02/14/2015  . Anemia 01/08/2013  . PEA (Pulseless electrical activity) ([place]) 12/03/2012  . Diabetes ([place]) 06/30/2011  . Nonspecific abnormal electrocardiogram (ECG) (EKG) 12/15/2010  . RENAL INSUFFICIENCY 09/23/2009  . GOUT 03/05/2007  . Essential hypertension 03/05/2007    Current Outpatient Prescriptions on File Prior to Visit  Medication Sig Dispense Refill  . amLODipine (NORVASC) 10 MG tablet Take 1 tablet (10 mg  total) by mouth daily. 30 tablet 0  . cloNIDine (CATAPRES) 0.2 MG tablet Take 1 tablet (0.2 mg total) by mouth 2 (two) times daily. 180 tablet 1  . glucose blood (ACCU-CHEK AVIVA PLUS) test strip Use as instructed to check blood sugars 2 times per day dx code E11.29 200 each 1  . Insulin Lispro Prot & Lispro (HUMALOG 75/25 MIX) (75-25) 100 UNIT/ML Kwikpen Inject 15 Units into the skin 2 (two) times daily. 20 mL   . isosorbide mononitrate (IMDUR) 60 MG 24 hr tablet Take 1 tablet (60 mg total) by mouth 2 (two) times daily. 180 tablet 0  . losartan (COZAAR) 100 MG tablet     . metoprolol succinate (TOPROL-XL) 100 MG 24 hr tablet TAKE 1 TABLET TWICE DAILY 180 tablet 0  . silodosin (RAPAFLO) 4 MG CAPS capsule Take 4 mg by mouth daily with breakfast.    . valsartan (DIOVAN) 320 MG tablet Take 1 tablet (320 mg total) by mouth daily. 90 tablet 1   No current facility-administered medications on file prior to visit.    Past Medical History  Diagnosis Date  . Renal insufficiency   . Diabetes mellitus   . Hypertension   . Gout   . Colon polyp 2003    Dr [name]; F/U was to be 2008( not completed)  . Diverticulosis 2003  . Pneumonia 09/29/11    Avelox X 10 days as OP  . Seizures ([place])     not treated for  seizure disorder; had a seizure after stroke 2014; no seizure since then  . Cataract   . Myocardial infarction ([place]) 2014  . CVA (cerebral infarction) 2014  . Prostate cancer [facility])     Past Surgical History  Procedure Laterality Date  . Refractive surgery  2005  . Colonoscopy w/ polypectomy  2003    no F/U ([place] discussed 12/03/12)  . Wrist aspiration  02/16/12     monosodium urate crystals; Dr Caralyn Guile  . Peg placement  2014  . Peg tube removal  2014  . Tracheostomy  2014    was closed after hospital stay  . Prostate biopsy      Social History   Social History  . Marital Status: Married    Spouse Name: N/A  . Number of Children: N/A  . Years of Education: N/A   Social  History Main Topics  . Smoking status: Former Smoker -- 0.25 packs/day for 1 years    Types: Cigarettes    Quit date: 05/15/1968  . Smokeless tobacco: Never Used     Comment: smoked [place], up to 1-2 cigarettes/ day  . Alcohol Use: No  . Drug Use: No  . Sexual Activity: Yes   Other Topics Concern  . Not on file   Social History Narrative   Walks three times a week, 2-3 miles, occasional walks on treadmill        Family History  Problem Relation Age of Onset  . Hypertension Father   . Prostate cancer Father   . Diabetes Father   . Cancer Father   . Hypertension Mother   . Diabetes Mother   . Stroke Neg Hx   . Heart disease Neg Hx   . Colon cancer Neg Hx     Review of Systems  Constitutional: Negative for fever and chills.  Respiratory: Negative for cough, shortness of breath and wheezing.   Cardiovascular: Negative for chest pain, palpitations and leg swelling.  Neurological: Positive for headaches (seldom). Negative for dizziness and light-headedness.       Objective:   Filed Vitals:   09/15/15 0931 09/15/15 0937  BP: 154/90 150/82  Pulse: 43   Temp: 97.8 F (36.6 C)   Resp: 16    Filed Weights   09/15/15 0931  Weight: 205 lb (92.987 kg)   Wt Readings from Last 3 Encounters:  09/15/15 205 lb (92.987 kg)  08/16/15 205 lb 14.4 oz (93.396 kg)  06/22/15 207 lb (93.895 kg)   Body mass index is 33.1 kg/(m^2).   Physical Exam Constitutional: Appears well-developed and well-nourished. No distress.  Neck: Neck supple. No tracheal deviation present. No thyromegaly present.  No carotid bruit. No cervical adenopathy.   Cardiovascular: Normal rate, regular rhythm and normal heart sounds.   2/6 sytolic murmur heard.  No edema Pulmonary/Chest: Effort normal and breath sounds normal. No respiratory distress. No wheezes.  Abdomen: ventral hernia, slightly tender, reducible      Assessment & Plan:   See Problem List for Assessment and Plan of chronic medical  problems.  Follow-up in 3 months, sooner if needed

## 2015-09-15 NOTE — Progress Notes (Signed)
Pre visit review using our clinic review tool, if applicable. No additional management support is needed unless otherwise documented below in the visit note. 

## 2015-09-15 NOTE — Patient Instructions (Addendum)
  Test(s) ordered today. Your results will be released to [facility] (or called to you) after review, usually within 72hours after test completion. If any changes need to be made, you will be notified at that same time.  All other Health Maintenance issues reviewed.   All recommended immunizations and age-appropriate screenings are up-to-date or discussed.  No immunizations administered today.   Medications reviewed and updated.  Changes include increasing your clonidine to 0.3 mg twice daily.  Start monitoring your blood pressure.   Your prescription(s) have been submitted to your pharmacy. Please take as directed and contact our office if you believe you are having problem(s) with the medication(s).  If you have pain from the hernia call so we can refer you to a surgeon.    Please followup in 3 months

## 2015-09-16 ENCOUNTER — Other Ambulatory Visit: Payer: Self-pay | Admitting: Internal Medicine

## 2015-09-16 MED ORDER — INSULIN LISPRO PROT & LISPRO (75-25 MIX) 100 UNIT/ML KWIKPEN: 18.0000 [IU] | PEN_INJECTOR | Freq: Two times a day (BID) | SUBCUTANEOUS | Status: DC

## 2015-09-17 ENCOUNTER — Encounter: Payer: Self-pay | Admitting: Emergency Medicine

## 2015-09-27 ENCOUNTER — Telehealth: Payer: Self-pay

## 2015-09-27 NOTE — Telephone Encounter (Signed)
Dr Clover Mealy office called and wanted [place] to know that pt has cancelled his appt twice. Their office was wanting [place] to know of the delay in getting the patient seen due to pt canceling appts.

## 2015-10-15 ENCOUNTER — Telehealth: Payer: Self-pay | Admitting: Endocrinology

## 2015-10-15 NOTE — Telephone Encounter (Signed)
I have not received a refill request on him, he hasn't been seen since October and needs an appointment anyways.

## 2015-10-15 NOTE — Telephone Encounter (Signed)
Patient state [facility] fax over prescription insulin Novalog and haven't heard anything our office.

## 2015-10-19 NOTE — Telephone Encounter (Signed)
He has not been seen since October, there is no phone number to call this pharmacy. Message left for patient to call and schedule an appointment before refills can be given.

## 2015-10-19 NOTE — Telephone Encounter (Signed)
[facility]y [name] from [facility] need a verbal prescription order for patient diabetic and insulin supply's.

## 2015-11-01 ENCOUNTER — Other Ambulatory Visit: Payer: Self-pay | Admitting: Internal Medicine

## 2015-11-04 ENCOUNTER — Other Ambulatory Visit: Payer: Self-pay | Admitting: Internal Medicine

## 2015-11-29 ENCOUNTER — Other Ambulatory Visit: Payer: Self-pay | Admitting: Internal Medicine

## 2015-12-07 DIAGNOSIS — C61 Malignant neoplasm of prostate: Secondary | ICD-10-CM | POA: Diagnosis not present

## 2015-12-09 ENCOUNTER — Ambulatory Visit
Admission: RE | Admit: 2015-12-09 | Discharge: 2015-12-09 | Disposition: A | Discharge status: Home / Self Care | Payer: Commercial Managed Care - HMO | Source: Ambulatory Visit | Attending: Radiation Oncology | Admitting: Radiation Oncology

## 2015-12-09 DIAGNOSIS — C61 Malignant neoplasm of prostate: Secondary | ICD-10-CM | POA: Insufficient documentation

## 2015-12-09 DIAGNOSIS — Z510 Encounter for antineoplastic radiation therapy: Secondary | ICD-10-CM | POA: Insufficient documentation

## 2015-12-10 ENCOUNTER — Ambulatory Visit: Payer: Commercial Managed Care - HMO | Admitting: Radiation Oncology

## 2015-12-12 NOTE — Progress Notes (Signed)
  Radiation Oncology         [phone] ________________________________  Name: Corey [name] MRN: [id number]  Date: 12/09/2015  DOB: 11/23/1945  SIMULATION AND TREATMENT PLANNING NOTE    ICD-9-CM ICD-10-CM   1. Prostate cancer ([place]) 185 C61     DIAGNOSIS:  70 y.o. gentleman with stage T1c adenocarcinoma of the prostate with a Gleason's score of 4+3 and a PSA of 4.48  NARRATIVE:  The patient was brought to the [facility].  Identity was confirmed.  All relevant records and images related to the planned course of therapy were reviewed.  The patient freely provided informed written consent to proceed with treatment after reviewing the details related to the planned course of therapy. The consent form was witnessed and verified by the simulation staff.  Then, the patient was set-up in a stable reproducible supine position for radiation therapy.  A vacuum lock pillow device was custom fabricated to position his legs in a reproducible immobilized position.  Then, I performed a urethrogram under sterile conditions to identify the prostatic apex.  CT images were obtained.  Surface markings were placed.  The CT images were loaded into the planning software.  Then the prostate target and avoidance structures including the rectum, bladder, bowel and hips were contoured.  Treatment planning then occurred.  The radiation prescription was entered and confirmed.  A total of 1 complex treatment device was fabricated. I have requested : Intensity Modulated Radiotherapy (IMRT) is medically necessary for this case for the following reason:  Rectal sparing.[place] Kitchen  PLAN:  The patient will receive 78 Gy in 40 fractions.  ________________________________  Sheral Apley Tammi Klippel, M.D.

## 2015-12-15 ENCOUNTER — Encounter: Payer: Commercial Managed Care - HMO | Admitting: Internal Medicine

## 2015-12-15 NOTE — Progress Notes (Signed)
Subjective:    Patient ID: Corey [name], male    DOB: November 12, 1945, 70 y.o.   MRN: [id number]  HPI He is here for follow up.  Hypertension: He is taking his medication daily. He is compliant with a low sodium diet.  He denies chest pain, palpitations, edema, shortness of breath and regular headaches. He is exercising regularly.  He does not monitor his blood pressure at home.    Diabetes: He is taking his medication daily as prescribed. He is compliant with a diabetic diet. He is exercising regularly. He monitors his sugars and they have been running XXX. He checks his feet daily and denies foot lesions. He is up-to-date with an ophthalmology examination.   CKD: He follows with nephrology.    Prostate cancer:  Ventral hernia:    Medications and allergies reviewed with patient and updated if appropriate.  Patient Active Problem List   Diagnosis Date Noted  . Chronic kidney disease 09/15/2015  . Ventral hernia without obstruction or gangrene 09/15/2015  . Prostate cancer ([place]) 07/25/2015  . Erectile dysfunction 05/04/2015  . Urinary frequency 05/04/2015  . 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 Abnormal finding on MRI of brain 02/14/2015  . PEA (Pulseless electrical activity) ([place]) 12/03/2012  . Diabetes ([place]) 06/30/2011  . Nonspecific abnormal electrocardiogram (ECG) (EKG) 12/15/2010  . GOUT 03/05/2007  . Essential hypertension 03/05/2007    Current Outpatient Prescriptions on File Prior to Visit  Medication Sig Dispense Refill  . amLODipine (NORVASC) 10 MG tablet TAKE 1 TABLET EVERY DAY 90 tablet 1  . cloNIDine (CATAPRES) 0.1 MG tablet Take 1 tablet (0.1 mg total) by mouth 2 (two) times daily. 180 tablet 1  . cloNIDine (CATAPRES) 0.3 MG tablet Take 1 tablet (0.3 mg total) by mouth 2 (two) times daily. 60 tablet 5  . glucose blood (ACCU-CHEK AVIVA PLUS) test strip Use as instructed to check blood sugars 2 times per day dx code E11.29 200 each 1  .  Insulin Lispro Prot & Lispro (HUMALOG 75/25 MIX) (75-25) 100 UNIT/ML Kwikpen Inject 18 Units into the skin 2 (two) times daily. 24 mL   . isosorbide mononitrate (IMDUR) 60 MG 24 hr tablet TAKE 1 TABLET TWICE DAILY 180 tablet 1  . losartan (COZAAR) 100 MG tablet TAKE 1 TABLET EVERY DAY 90 tablet 1  . metoprolol succinate (TOPROL-XL) 100 MG 24 hr tablet TAKE 1 TABLET TWICE DAILY 180 tablet 1  . silodosin (RAPAFLO) 4 MG CAPS capsule Take 4 mg by mouth daily with breakfast.    . tamsulosin (FLOMAX) 0.4 MG CAPS capsule TAKE 1 CAPSULE EVERY DAY 90 capsule 1  . valsartan (DIOVAN) 320 MG tablet Take 1 tablet (320 mg total) by mouth daily. 90 tablet 1   No current facility-administered medications on file prior to visit.     Past Medical History:  Diagnosis Date  . Cataract   . Colon polyp 2003   Dr [name]; F/U was to be 2008( not completed)  . CVA (cerebral infarction) 2014  . Diabetes mellitus   . Diverticulosis 2003  . Gout   . Hypertension   . Myocardial infarction ([place]) 2014  . Pneumonia 09/29/11   Avelox X 10 days as OP  . Prostate cancer ([place])   . Renal insufficiency   . Seizures ([place])    not treated for seizure disorder; had a seizure after stroke 2014; no seizure since then    Past Surgical History:  Procedure Laterality Date  .  COLONOSCOPY W/ POLYPECTOMY  2003   no F/U ([place] discussed 12/03/12)  . PEG PLACEMENT  2014  . PEG TUBE REMOVAL  2014  . PROSTATE BIOPSY    . REFRACTIVE SURGERY  2005  . TRACHEOSTOMY  2014   was closed after hospital stay  . wrist aspiration  02/16/12    monosodium urate crystals; Dr Caralyn Guile    Social History   Social History  . Marital status: Married    Spouse name: N/A  . Number of children: N/A  . Years of education: N/A   Social History Main Topics  . Smoking status: Former Smoker    Packs/day: 0.25    Years: 1.00    Types: Cigarettes    Quit date: 05/15/1968  . Smokeless tobacco: Never Used     Comment: smoked [place], up to  1-2 cigarettes/ day  . Alcohol use No  . Drug use: No  . Sexual activity: Yes   Other Topics Concern  . Not on file   Social History Narrative   Walks three times a week, 2-3 miles, occasional walks on treadmill        Family History  Problem Relation Age of Onset  . Hypertension Father   . Prostate cancer Father   . Diabetes Father   . Cancer Father   . Hypertension Mother   . Diabetes Mother   . Stroke Neg Hx   . Heart disease Neg Hx   . Colon cancer Neg Hx     Review of Systems     Objective:  There were no vitals filed for this visit. There were no vitals filed for this visit. There is no height or weight on file to calculate BMI.   Physical Exam        Assessment & Plan:    See Problem List for Assessment and Plan of chronic medical problems.    This encounter was created in error - please disregard.

## 2015-12-16 DIAGNOSIS — C61 Malignant neoplasm of prostate: Secondary | ICD-10-CM | POA: Diagnosis not present

## 2015-12-16 DIAGNOSIS — Z510 Encounter for antineoplastic radiation therapy: Secondary | ICD-10-CM | POA: Diagnosis not present

## 2015-12-20 ENCOUNTER — Ambulatory Visit
Admission: RE | Admit: 2015-12-20 | Discharge: 2015-12-20 | Disposition: A | Discharge status: Home / Self Care | Payer: Commercial Managed Care - HMO | Source: Ambulatory Visit | Attending: Radiation Oncology | Admitting: Radiation Oncology

## 2015-12-20 DIAGNOSIS — C61 Malignant neoplasm of prostate: Secondary | ICD-10-CM | POA: Diagnosis not present

## 2015-12-20 DIAGNOSIS — Z510 Encounter for antineoplastic radiation therapy: Secondary | ICD-10-CM | POA: Diagnosis not present

## 2015-12-21 ENCOUNTER — Ambulatory Visit
Admission: RE | Admit: 2015-12-21 | Discharge: 2015-12-21 | Disposition: A | Discharge status: Home / Self Care | Payer: Commercial Managed Care - HMO | Source: Ambulatory Visit | Attending: Radiation Oncology | Admitting: Radiation Oncology

## 2015-12-21 DIAGNOSIS — C61 Malignant neoplasm of prostate: Secondary | ICD-10-CM | POA: Diagnosis not present

## 2015-12-21 DIAGNOSIS — Z510 Encounter for antineoplastic radiation therapy: Secondary | ICD-10-CM | POA: Diagnosis not present

## 2015-12-22 ENCOUNTER — Ambulatory Visit
Admission: RE | Admit: 2015-12-22 | Discharge: 2015-12-22 | Disposition: A | Discharge status: Home / Self Care | Payer: Commercial Managed Care - HMO | Source: Ambulatory Visit | Attending: Radiation Oncology | Admitting: Radiation Oncology

## 2015-12-22 DIAGNOSIS — Z510 Encounter for antineoplastic radiation therapy: Secondary | ICD-10-CM | POA: Diagnosis not present

## 2015-12-22 DIAGNOSIS — C61 Malignant neoplasm of prostate: Secondary | ICD-10-CM | POA: Diagnosis not present

## 2015-12-23 ENCOUNTER — Ambulatory Visit
Admission: RE | Admit: 2015-12-23 | Discharge: 2015-12-23 | Disposition: A | Discharge status: Home / Self Care | Payer: Commercial Managed Care - HMO | Source: Ambulatory Visit | Attending: Radiation Oncology | Admitting: Radiation Oncology

## 2015-12-23 DIAGNOSIS — Z510 Encounter for antineoplastic radiation therapy: Secondary | ICD-10-CM | POA: Diagnosis not present

## 2015-12-23 DIAGNOSIS — C61 Malignant neoplasm of prostate: Secondary | ICD-10-CM | POA: Diagnosis not present

## 2015-12-24 ENCOUNTER — Ambulatory Visit
Admission: RE | Admit: 2015-12-24 | Discharge: 2015-12-24 | Disposition: A | Discharge status: Home / Self Care | Payer: Commercial Managed Care - HMO | Source: Ambulatory Visit | Attending: Radiation Oncology | Admitting: Radiation Oncology

## 2015-12-24 ENCOUNTER — Encounter: Payer: Self-pay | Admitting: Radiation Oncology

## 2015-12-24 VITALS — BP 176/86 | HR 77 | Resp 16 | Wt 202.8 lb

## 2015-12-24 DIAGNOSIS — Z510 Encounter for antineoplastic radiation therapy: Secondary | ICD-10-CM | POA: Diagnosis not present

## 2015-12-24 DIAGNOSIS — C61 Malignant neoplasm of prostate: Secondary | ICD-10-CM

## 2015-12-24 MED ORDER — TAMSULOSIN HCL 0.4 MG PO CAPS: 0.4000 mg | ORAL_CAPSULE | Freq: Every day | ORAL | 1 refills | Status: DC

## 2015-12-24 NOTE — Progress Notes (Addendum)
Weight and vitals stable. Denies pain. Reports urinary frequency and difficulty emptying. Denies taking Flomax. Discussed mechanism of action of Flomax and encouraged patient resume. Patient reports he only has two flomax pills left. Denies dysuria or hematuria. Reports nocturia x 4. Reports occasional episodes of diarrhea. Denies fatigue.  BP (!) 176/86   Pulse 77   Resp 16   Wt 202 lb 12.8 oz (92 kg)   SpO2 100%   BMI 32.73 kg/m  Wt Readings from Last 3 Encounters:  12/24/15 202 lb 12.8 oz (92 kg)  09/15/15 205 lb (93 kg)  08/16/15 205 lb 14.4 oz (93.4 kg)

## 2015-12-24 NOTE — Progress Notes (Signed)
   Department of Radiation Oncology  Phone:  [phone] Fax:        [phone]  Weekly Treatment Note    Name: Corey [name] Date: 12/26/2015 MRN: [id number] DOB: [birth year]   Diagnosis:     ICD-9-CM ICD-10-CM   1. Prostate cancer ([place]) 185 C61      Current dose: 9.75 Gy  Current fraction: 5   MEDICATIONS: Current Outpatient Prescriptions  Medication Sig Dispense Refill  . amLODipine (NORVASC) 10 MG tablet TAKE 1 TABLET EVERY DAY 90 tablet 1  . cloNIDine (CATAPRES) 0.3 MG tablet Take 1 tablet (0.3 mg total) by mouth 2 (two) times daily. 60 tablet 5  . diazepam (VALIUM) 10 MG tablet     . glucose blood (ACCU-CHEK AVIVA PLUS) test strip Use as instructed to check blood sugars 2 times per day dx code E11.29 200 each 1  . Insulin Lispro Prot & Lispro (HUMALOG 75/25 MIX) (75-25) 100 UNIT/ML Kwikpen Inject 18 Units into the skin 2 (two) times daily. 24 mL   . isosorbide mononitrate (IMDUR) 60 MG 24 hr tablet TAKE 1 TABLET TWICE DAILY 180 tablet 1  . losartan (COZAAR) 100 MG tablet TAKE 1 TABLET EVERY DAY 90 tablet 1  . metoprolol succinate (TOPROL-XL) 100 MG 24 hr tablet TAKE 1 TABLET TWICE DAILY 180 tablet 1  . silodosin (RAPAFLO) 4 MG CAPS capsule Take 4 mg by mouth daily with breakfast.    . valsartan (DIOVAN) 320 MG tablet Take 1 tablet (320 mg total) by mouth daily. 90 tablet 1  . tamsulosin (FLOMAX) 0.4 MG CAPS capsule Take 1 capsule (0.4 mg total) by mouth daily. 90 capsule 1   No current facility-administered medications for this encounter.      ALLERGIES: Hydrochlorothiazide and Allopurinol   LABORATORY DATA:  Lab Results  Component Value Date   WBC 4.4 06/16/2015   HGB 12.9 (L) 06/16/2015   HCT 40.0 06/16/2015   MCV 80.2 06/16/2015   PLT 197.0 06/16/2015   Lab Results  Component Value Date   NA 141 09/15/2015   K 4.3 09/15/2015   CL 111 09/15/2015   CO2 26 09/15/2015   Lab Results  Component Value Date   ALT 10 09/15/2015   AST 15 09/15/2015   ALKPHOS 38 (L) 09/15/2015   BILITOT 0.5 09/15/2015     NARRATIVE: Corey [name] was seen today for weekly treatment management. The chart was checked and the patient's films were reviewed.  Weight and vitals stable. Denies pain. Reports urinary frequency and difficulty emptying. Denies taking Flomax. Patient reports he only has two Flomax pills left. Denies dysuria or hematuria. Reports nocturia x4. Reports occasional episodes of diarrhea. Denies fatigue.   PHYSICAL EXAMINATION: weight is 202 lb 12.8 oz (92 kg). His blood pressure is 176/86 (abnormal) and his pulse is 77. His respiration is 16 and oxygen saturation is 100%.      Alert, in no acute distress.  ASSESSMENT: The patient is doing satisfactorily with treatment.  PLAN: We will continue with the patient's radiation treatment as planned. I have refilled the patient's Flomax.    This document serves as a record of services personally performed by Kyung Rudd, MD. It was created on his behalf by Arlyce Harman, a trained medical scribe. The creation of this record is based on the scribe's personal observations and the provider's statements to them. This document has been checked and approved by the attending provider.  ------------------------------------------------  Jodelle Gross, MD, PhD

## 2015-12-27 ENCOUNTER — Ambulatory Visit
Admission: RE | Admit: 2015-12-27 | Discharge: 2015-12-27 | Disposition: A | Discharge status: Home / Self Care | Payer: Commercial Managed Care - HMO | Source: Ambulatory Visit | Attending: Radiation Oncology | Admitting: Radiation Oncology

## 2015-12-27 ENCOUNTER — Other Ambulatory Visit: Payer: Self-pay | Admitting: Radiation Oncology

## 2015-12-27 DIAGNOSIS — C61 Malignant neoplasm of prostate: Secondary | ICD-10-CM

## 2015-12-27 DIAGNOSIS — Z510 Encounter for antineoplastic radiation therapy: Secondary | ICD-10-CM | POA: Diagnosis not present

## 2015-12-27 MED ORDER — SILODOSIN 4 MG PO CAPS: 4.0000 mg | ORAL_CAPSULE | Freq: Every day | ORAL | 6 refills | Status: DC

## 2015-12-28 ENCOUNTER — Telehealth: Payer: Self-pay | Admitting: Radiation Oncology

## 2015-12-28 ENCOUNTER — Encounter: Payer: Self-pay | Admitting: Medical Oncology

## 2015-12-28 ENCOUNTER — Ambulatory Visit
Admission: RE | Admit: 2015-12-28 | Discharge: 2015-12-28 | Disposition: A | Discharge status: Home / Self Care | Payer: Commercial Managed Care - HMO | Source: Ambulatory Visit | Attending: Radiation Oncology | Admitting: Radiation Oncology

## 2015-12-28 DIAGNOSIS — Z510 Encounter for antineoplastic radiation therapy: Secondary | ICD-10-CM | POA: Diagnosis not present

## 2015-12-28 DIAGNOSIS — C61 Malignant neoplasm of prostate: Secondary | ICD-10-CM | POA: Diagnosis not present

## 2015-12-28 NOTE — Progress Notes (Signed)
Oncology Nurse Navigator Documentation  Oncology Nurse Navigator Flowsheets 12/28/2015  Navigator Location [facility]  Navigator Encounter Type Treatment  Barriers/Navigation Needs Family concerns  Interventions Medication assitance  Support Groups/Services Friends and Family  Acuity Level 1  Time Spent with Patient 30  I met Corey [name] today in radiation.I introduced myself and my role as the navigator and gave him my business card. Sam- Dr. Johny Shears nurse asked if I could try and get him samples of Rapaflo. He was given a prescription to be filled by [facility] but the turn around time is 6-10 days. He cannot afford to buy at the local pharmacy due to cost. I called Dr. Arlyn Leak office, spoke with Rebekah Chesterfield and asked if patient could get samples until he receives his shipment. She states yes and will place them at the front desk. Patient notified and he will pick them up.

## 2015-12-28 NOTE — Telephone Encounter (Signed)
Patient's prescriptions are covered by [facility] but, the turnaround time to receive medications via mail is 6-10 days. Thus, patient was [facility] Received call from patient late yesterday that [facility] wants to charge him $300 for a thirty day supply of rapaflo. Patient unable to afford this. Phoned [facility] to inquire and their cost is $250. Patient unable to afford this. Confirmed with Lequita Asal, pharmacist for [facility], that script was received and is being processed. For cost saving purposes a 90 day supply with 1 refill was approved. Phoned patient informing him of these findings. He plans to wait for Rapaflo to be delivery from [facility]. Shona Simpson, PA-C informed.

## 2015-12-29 ENCOUNTER — Ambulatory Visit
Admission: RE | Admit: 2015-12-29 | Discharge: 2015-12-29 | Disposition: A | Discharge status: Home / Self Care | Payer: Commercial Managed Care - HMO | Source: Ambulatory Visit | Attending: Radiation Oncology | Admitting: Radiation Oncology

## 2015-12-29 DIAGNOSIS — C61 Malignant neoplasm of prostate: Secondary | ICD-10-CM | POA: Diagnosis not present

## 2015-12-29 DIAGNOSIS — Z510 Encounter for antineoplastic radiation therapy: Secondary | ICD-10-CM | POA: Diagnosis not present

## 2015-12-29 NOTE — Addendum Note (Signed)
Encounter addended by: Heywood Footman, RN on: 12/29/2015  8:49 AM<BR>    Actions taken: Patient Education assessment filed, Sign clinical note

## 2015-12-30 ENCOUNTER — Ambulatory Visit
Admission: RE | Admit: 2015-12-30 | Discharge: 2015-12-30 | Disposition: A | Discharge status: Home / Self Care | Payer: Commercial Managed Care - HMO | Source: Ambulatory Visit | Attending: Radiation Oncology | Admitting: Radiation Oncology

## 2015-12-30 DIAGNOSIS — Z510 Encounter for antineoplastic radiation therapy: Secondary | ICD-10-CM | POA: Diagnosis not present

## 2015-12-30 DIAGNOSIS — C61 Malignant neoplasm of prostate: Secondary | ICD-10-CM | POA: Diagnosis not present

## 2015-12-31 ENCOUNTER — Encounter: Payer: Self-pay | Admitting: Radiation Oncology

## 2015-12-31 ENCOUNTER — Ambulatory Visit
Admission: RE | Admit: 2015-12-31 | Discharge: 2015-12-31 | Disposition: A | Discharge status: Home / Self Care | Payer: Commercial Managed Care - HMO | Source: Ambulatory Visit | Attending: Radiation Oncology | Admitting: Radiation Oncology

## 2015-12-31 VITALS — BP 163/82 | HR 41 | Resp 16 | Wt 198.0 lb

## 2015-12-31 DIAGNOSIS — C61 Malignant neoplasm of prostate: Secondary | ICD-10-CM | POA: Diagnosis not present

## 2015-12-31 DIAGNOSIS — R3912 Poor urinary stream: Secondary | ICD-10-CM | POA: Insufficient documentation

## 2015-12-31 DIAGNOSIS — Z510 Encounter for antineoplastic radiation therapy: Secondary | ICD-10-CM | POA: Diagnosis not present

## 2015-12-31 NOTE — Progress Notes (Signed)
  Radiation Oncology         [phone]   Name: Corey [name] MRN: [id number]   Date: 12/31/2015  DOB: 1945-07-17     Weekly Radiation Therapy Management    ICD-9-CM ICD-10-CM   1. Prostate cancer ([place]) 185 C61     Current Dose: 19.5 Gy  Planned Dose:  78 Gy  Narrative The patient presents for routine under treatment assessment.  4 lbs weight loss noted. Heart rate low. Advised patient to increase fluid intake. Patient denies feeling lightheaded, dizzy, or having chest pain. Denies pain. Reports urinary frequency and difficulty emptying are slightly improved with the aid of Rapaflo samples. Denies dysuria or hematuria. Reports nocturia x 2-3 down from 4. Reports diarrhea on average twice per day. Reports mild fatigue.  The patient is without complaint. Set-up films were reviewed. The chart was checked.  Physical Findings  weight is 198 lb (89.8 kg). His blood pressure is 163/82 (abnormal) and his pulse is 41 (abnormal). His respiration is 16 and oxygen saturation is 100%. . Weight loss noted.  Impression The patient is tolerating radiation.  Plan Continue treatment as planned.         Sheral Apley Tammi Klippel, M.D.  This document serves as a record of services personally performed by Tyler Pita, MD. It was created on his behalf by Arlyce Harman, a trained medical scribe. The creation of this record is based on the scribe's personal observations and the provider's statements to them. This document has been checked and approved by the attending provider.

## 2015-12-31 NOTE — Progress Notes (Signed)
4 lb weight loss noted. Heart rate low. Advised patient to increase fluid intake. Patient denies feeling lightheaded, dizzy or having chest pain. Denies pain. Reports urinary frequency and difficulty emptying are slightly improved with the aid of Rapaflo samples. Denies dysuria or hematuria. Reports nocturia x 2-3 down from 4. Reports diarrhea on average twice per day. Reports mild fatigue.   BP (!) 163/82   Pulse (!) 41   Resp 16   Wt 198 lb (89.8 kg)   SpO2 100%   BMI 31.96 kg/m  Wt Readings from Last 3 Encounters:  12/31/15 198 lb (89.8 kg)  12/24/15 202 lb 12.8 oz (92 kg)  09/15/15 205 lb (93 kg)

## 2016-01-03 ENCOUNTER — Ambulatory Visit
Admission: RE | Admit: 2016-01-03 | Discharge: 2016-01-03 | Disposition: A | Discharge status: Home / Self Care | Payer: Commercial Managed Care - HMO | Source: Ambulatory Visit | Attending: Radiation Oncology | Admitting: Radiation Oncology

## 2016-01-03 ENCOUNTER — Other Ambulatory Visit: Payer: Commercial Managed Care - HMO

## 2016-01-03 DIAGNOSIS — C61 Malignant neoplasm of prostate: Secondary | ICD-10-CM | POA: Diagnosis not present

## 2016-01-03 DIAGNOSIS — Z510 Encounter for antineoplastic radiation therapy: Secondary | ICD-10-CM | POA: Diagnosis not present

## 2016-01-04 ENCOUNTER — Ambulatory Visit
Admission: RE | Admit: 2016-01-04 | Discharge: 2016-01-04 | Disposition: A | Discharge status: Home / Self Care | Payer: Commercial Managed Care - HMO | Source: Ambulatory Visit | Attending: Radiation Oncology | Admitting: Radiation Oncology

## 2016-01-04 DIAGNOSIS — C61 Malignant neoplasm of prostate: Secondary | ICD-10-CM | POA: Diagnosis not present

## 2016-01-04 DIAGNOSIS — Z510 Encounter for antineoplastic radiation therapy: Secondary | ICD-10-CM | POA: Diagnosis not present

## 2016-01-05 ENCOUNTER — Ambulatory Visit
Admission: RE | Admit: 2016-01-05 | Discharge: 2016-01-05 | Disposition: A | Discharge status: Home / Self Care | Payer: Commercial Managed Care - HMO | Source: Ambulatory Visit | Attending: Radiation Oncology | Admitting: Radiation Oncology

## 2016-01-05 DIAGNOSIS — Z510 Encounter for antineoplastic radiation therapy: Secondary | ICD-10-CM | POA: Diagnosis not present

## 2016-01-05 DIAGNOSIS — C61 Malignant neoplasm of prostate: Secondary | ICD-10-CM | POA: Diagnosis not present

## 2016-01-06 ENCOUNTER — Ambulatory Visit
Admission: RE | Admit: 2016-01-06 | Discharge: 2016-01-06 | Disposition: A | Discharge status: Home / Self Care | Payer: Commercial Managed Care - HMO | Source: Ambulatory Visit | Attending: Radiation Oncology | Admitting: Radiation Oncology

## 2016-01-06 ENCOUNTER — Ambulatory Visit: Payer: Commercial Managed Care - HMO | Admitting: Endocrinology

## 2016-01-06 ENCOUNTER — Encounter: Payer: Self-pay | Admitting: Radiation Oncology

## 2016-01-06 VITALS — BP 173/92 | HR 43 | Temp 97.9°F | Resp 12 | Wt 201.3 lb

## 2016-01-06 DIAGNOSIS — C61 Malignant neoplasm of prostate: Secondary | ICD-10-CM | POA: Diagnosis not present

## 2016-01-06 DIAGNOSIS — Z510 Encounter for antineoplastic radiation therapy: Secondary | ICD-10-CM | POA: Diagnosis not present

## 2016-01-06 NOTE — Progress Notes (Signed)
  Radiation Oncology         [phone]   Name: Corey [name] MRN: [id number]   Date: 01/06/2016  DOB: [birth year]     Weekly Radiation Therapy Management    ICD-9-CM ICD-10-CM   1. Prostate cancer ([place]) 185 C61     Current Dose: 27.3 Gy  Planned Dose:  78 Gy  Narrative The patient presents for routine under treatment assessment.  He reports no current pain. Patient reports no pain when urination. Patient reports urinary urgency and hesitancy. Patient states they urinate 3-4 times per night. He additionally reports diarrhea once a day per day. Patient is requesting a doctors note for jury duty in October but he forgot to bring the summons. He will bring the summons tomorrow for [facility]ona Simpson [facility].  The patient is without complaint. Set-up films were reviewed. The chart was checked.  Physical Findings  weight is 201 lb 4.8 oz (91.3 kg). His oral temperature is 97.9 F (36.6 C). His blood pressure is 173/92 (abnormal) and his pulse is 43 (abnormal). His respiration is 12 and oxygen saturation is 100%. .  Alert and in no acute distress.  Impression The patient is tolerating radiation.  Plan Continue treatment as planned.         Sheral Apley Tammi Klippel, M.D.  This document serves as a record of services personally performed by Corey Pita, MD. It was created on his behalf by Corey [name], a trained medical scribe. The creation of this record is based on the scribe's personal observations and the provider's statements to them. This document has been checked and approved by the attending provider.

## 2016-01-06 NOTE — Progress Notes (Signed)
PAIN: He is currently in no pain. URINARY: Pt reports urinary urgency and hesistency. Pt states they urinate 3 - 4 times per night.  BOWEL: Pt reports Diarrhea once a day, everyday. Recommended Imodium if needed.   OTHER:  BP (!) 173/92   Pulse (!) 43   Temp 97.9 F (36.6 C) (Oral)   Resp 12   Wt 201 lb 4.8 oz (91.3 kg)   SpO2 100%   BMI 32.49 kg/m  Wt Readings from Last 3 Encounters:  01/06/16 201 lb 4.8 oz (91.3 kg)  12/31/15 198 lb (89.8 kg)  12/24/15 202 lb 12.8 oz (92 kg)

## 2016-01-07 ENCOUNTER — Ambulatory Visit
Admission: RE | Admit: 2016-01-07 | Discharge: 2016-01-07 | Disposition: A | Discharge status: Home / Self Care | Payer: Commercial Managed Care - HMO | Source: Ambulatory Visit | Attending: Radiation Oncology | Admitting: Radiation Oncology

## 2016-01-07 ENCOUNTER — Encounter: Payer: Self-pay | Admitting: Radiation Oncology

## 2016-01-07 DIAGNOSIS — Z510 Encounter for antineoplastic radiation therapy: Secondary | ICD-10-CM | POA: Diagnosis not present

## 2016-01-07 DIAGNOSIS — C61 Malignant neoplasm of prostate: Secondary | ICD-10-CM | POA: Diagnosis not present

## 2016-01-10 ENCOUNTER — Ambulatory Visit
Admission: RE | Admit: 2016-01-10 | Discharge: 2016-01-10 | Disposition: A | Discharge status: Home / Self Care | Payer: Commercial Managed Care - HMO | Source: Ambulatory Visit | Attending: Radiation Oncology | Admitting: Radiation Oncology

## 2016-01-10 DIAGNOSIS — C61 Malignant neoplasm of prostate: Secondary | ICD-10-CM | POA: Diagnosis not present

## 2016-01-10 DIAGNOSIS — Z510 Encounter for antineoplastic radiation therapy: Secondary | ICD-10-CM | POA: Diagnosis not present

## 2016-01-11 ENCOUNTER — Ambulatory Visit: Payer: Commercial Managed Care - HMO | Admitting: Endocrinology

## 2016-01-11 ENCOUNTER — Encounter: Payer: Self-pay | Admitting: Medical Oncology

## 2016-01-11 ENCOUNTER — Telehealth: Payer: Self-pay | Admitting: Radiation Oncology

## 2016-01-11 ENCOUNTER — Ambulatory Visit
Admission: RE | Admit: 2016-01-11 | Discharge: 2016-01-11 | Disposition: A | Discharge status: Home / Self Care | Payer: Commercial Managed Care - HMO | Source: Ambulatory Visit | Attending: Radiation Oncology | Admitting: Radiation Oncology

## 2016-01-11 ENCOUNTER — Other Ambulatory Visit (INDEPENDENT_AMBULATORY_CARE_PROVIDER_SITE_OTHER): Payer: Commercial Managed Care - HMO

## 2016-01-11 DIAGNOSIS — Z794 Long term (current) use of insulin: Secondary | ICD-10-CM | POA: Diagnosis not present

## 2016-01-11 DIAGNOSIS — C61 Malignant neoplasm of prostate: Secondary | ICD-10-CM | POA: Diagnosis not present

## 2016-01-11 DIAGNOSIS — E1165 Type 2 diabetes mellitus with hyperglycemia: Secondary | ICD-10-CM

## 2016-01-11 DIAGNOSIS — Z510 Encounter for antineoplastic radiation therapy: Secondary | ICD-10-CM | POA: Diagnosis not present

## 2016-01-11 LAB — BASIC METABOLIC PANEL
BUN: 27 mg/dL — ABNORMAL HIGH (ref 6–23)
CALCIUM: 9.5 mg/dL (ref 8.4–10.5)
CHLORIDE: 112 meq/L (ref 96–112)
CO2: 23 meq/L (ref 19–32)
Creatinine, Ser: 1.48 mg/dL (ref 0.40–1.50)
GFR: 60.84 mL/min (ref >60.00)
Glucose, Bld: 149 mg/dL — ABNORMAL HIGH (ref 70–99)
Potassium: 3.8 mEq/L (ref 3.5–5.1)
SODIUM: 141 meq/L (ref 135–145)

## 2016-01-11 LAB — HEMOGLOBIN A1C: HEMOGLOBIN A1C: 7.4 % — AB (ref 4.6–6.5)

## 2016-01-11 NOTE — Progress Notes (Signed)
Oncology Nurse Navigator Documentation  Oncology Nurse Navigator Flowsheets 12/28/2015 01/11/2016  Navigator Location [facility] -  Navigator Encounter Type Treatment Treatment  Patient Visit Type - RadOnc  Treatment Phase - Treatment  Barriers/Navigation Needs Family concerns Education  Education - Pain/ Symptom Management  Interventions Medication assitance Education Method  Support Groups/Services Friends and Family -  Acuity Level 1 -  Time Spent with Patient 63 -   Mr. Schrage tolerating radiation well but he is having some side effects. He states that he has nocturia 3-4 times even though he is taking Rapaflo. He has had some occasional diarrhea.

## 2016-01-11 NOTE — Telephone Encounter (Signed)
Letter to pardon patient from jury duty given to [place], RT on L1 to give to the patient tomorrow following xrt.

## 2016-01-12 ENCOUNTER — Ambulatory Visit
Admission: RE | Admit: 2016-01-12 | Discharge: 2016-01-12 | Disposition: A | Discharge status: Home / Self Care | Payer: Commercial Managed Care - HMO | Source: Ambulatory Visit | Attending: Radiation Oncology | Admitting: Radiation Oncology

## 2016-01-12 DIAGNOSIS — Z510 Encounter for antineoplastic radiation therapy: Secondary | ICD-10-CM | POA: Diagnosis not present

## 2016-01-12 DIAGNOSIS — C61 Malignant neoplasm of prostate: Secondary | ICD-10-CM | POA: Diagnosis not present

## 2016-01-13 ENCOUNTER — Ambulatory Visit
Admission: RE | Admit: 2016-01-13 | Discharge: 2016-01-13 | Disposition: A | Discharge status: Home / Self Care | Payer: Commercial Managed Care - HMO | Source: Ambulatory Visit | Attending: Radiation Oncology | Admitting: Radiation Oncology

## 2016-01-13 VITALS — BP 158/85 | HR 47 | Temp 98.2°F | Resp 12 | Wt 199.9 lb

## 2016-01-13 DIAGNOSIS — C61 Malignant neoplasm of prostate: Secondary | ICD-10-CM | POA: Diagnosis not present

## 2016-01-13 DIAGNOSIS — Z510 Encounter for antineoplastic radiation therapy: Secondary | ICD-10-CM | POA: Diagnosis not present

## 2016-01-13 NOTE — Progress Notes (Signed)
  Radiation Oncology         [phone]   Name: Corey [name] MRN: [id number]   Date: 01/13/2016  DOB: 1946/04/12     Weekly Radiation Therapy Management    ICD-9-CM ICD-10-CM   1. Prostate cancer ([place]) 185 C61     Current Dose: 37.05 Gy  Planned Dose:  78 Gy  Narrative The patient presents for routine under treatment assessment.  The patient reports no pain. Patient reports urinary frequency, urgency, retention, hesitancy, incontinence, and dribbling. Patient reports nocturia x 2-3. Patient reports a soft bowel movements everyday/every other day.   Set-up films were reviewed. The chart was checked.  Physical Findings  weight is 199 lb 14.4 oz (90.7 kg). His oral temperature is 98.2 F (36.8 C). His blood pressure is 158/85 (abnormal) and his pulse is 47 (abnormal). His respiration is 12 and oxygen saturation is 100%. .  Alert and in no acute distress.  Impression The patient is tolerating radiation.  Plan Continue treatment as planned.         Sheral Apley Tammi Klippel, M.D.  This document serves as a record of services personally performed by Tyler Pita, MD. It was created on his behalf by Bethann Humble, a trained medical scribe. The creation of this record is based on the scribe's personal observations and the provider's statements to them. This document has been checked and approved by the attending provider.

## 2016-01-13 NOTE — Progress Notes (Addendum)
PAIN: He is currently in no pain.  URINARY: Pt reports occasional urinary frequency, urgency, retention, hesistency, incontinence and dribbling. Pt states they urinate 2 - 3 times per night.  BOWEL: Pt reports a soft bowel movement everyday/everyother day.  BP (!) 158/85   Pulse (!) 47   Temp 98.2 F (36.8 C) (Oral)   Resp 12   Wt 199 lb 14.4 oz (90.7 kg)   SpO2 100%   BMI 32.26 kg/m  Wt Readings from Last 3 Encounters:  01/13/16 199 lb 14.4 oz (90.7 kg)  01/06/16 201 lb 4.8 oz (91.3 kg)  12/31/15 198 lb (89.8 kg)

## 2016-01-14 ENCOUNTER — Ambulatory Visit
Admission: RE | Admit: 2016-01-14 | Discharge: 2016-01-14 | Disposition: A | Discharge status: Home / Self Care | Payer: Commercial Managed Care - HMO | Source: Ambulatory Visit | Attending: Radiation Oncology | Admitting: Radiation Oncology

## 2016-01-14 DIAGNOSIS — Z510 Encounter for antineoplastic radiation therapy: Secondary | ICD-10-CM | POA: Diagnosis not present

## 2016-01-14 DIAGNOSIS — C61 Malignant neoplasm of prostate: Secondary | ICD-10-CM | POA: Diagnosis not present

## 2016-01-18 ENCOUNTER — Ambulatory Visit
Admission: RE | Admit: 2016-01-18 | Discharge: 2016-01-18 | Disposition: A | Discharge status: Home / Self Care | Payer: Commercial Managed Care - HMO | Source: Ambulatory Visit | Attending: Radiation Oncology | Admitting: Radiation Oncology

## 2016-01-18 DIAGNOSIS — Z510 Encounter for antineoplastic radiation therapy: Secondary | ICD-10-CM | POA: Diagnosis not present

## 2016-01-18 DIAGNOSIS — C61 Malignant neoplasm of prostate: Secondary | ICD-10-CM | POA: Diagnosis not present

## 2016-01-19 ENCOUNTER — Ambulatory Visit
Admission: RE | Admit: 2016-01-19 | Discharge: 2016-01-19 | Disposition: A | Discharge status: Home / Self Care | Payer: Commercial Managed Care - HMO | Source: Ambulatory Visit | Attending: Radiation Oncology | Admitting: Radiation Oncology

## 2016-01-19 DIAGNOSIS — Z510 Encounter for antineoplastic radiation therapy: Secondary | ICD-10-CM | POA: Diagnosis not present

## 2016-01-19 DIAGNOSIS — C61 Malignant neoplasm of prostate: Secondary | ICD-10-CM | POA: Diagnosis not present

## 2016-01-20 ENCOUNTER — Encounter: Payer: Self-pay | Admitting: Medical Oncology

## 2016-01-20 ENCOUNTER — Ambulatory Visit
Admission: RE | Admit: 2016-01-20 | Discharge: 2016-01-20 | Disposition: A | Discharge status: Home / Self Care | Payer: Commercial Managed Care - HMO | Source: Ambulatory Visit | Attending: Radiation Oncology | Admitting: Radiation Oncology

## 2016-01-20 DIAGNOSIS — Z510 Encounter for antineoplastic radiation therapy: Secondary | ICD-10-CM | POA: Diagnosis not present

## 2016-01-20 DIAGNOSIS — C61 Malignant neoplasm of prostate: Secondary | ICD-10-CM | POA: Diagnosis not present

## 2016-01-20 NOTE — Progress Notes (Signed)
Corey [name] states that he tolerating his radiation well. He continues to have burning and urgency even though he is taking the Rapaflo.  We discussed this is caused by the radiation and without the Rapaflo it would be worse. I did inform him once he completes the treatments his urination function will begin to improve. He voiced understanding and he will also see Dr. Tammi Klippel tomorrow.

## 2016-01-21 ENCOUNTER — Ambulatory Visit
Admission: RE | Admit: 2016-01-21 | Discharge: 2016-01-21 | Disposition: A | Discharge status: Home / Self Care | Payer: Commercial Managed Care - HMO | Source: Ambulatory Visit | Attending: Radiation Oncology | Admitting: Radiation Oncology

## 2016-01-21 ENCOUNTER — Encounter: Payer: Self-pay | Admitting: Radiation Oncology

## 2016-01-21 VITALS — BP 136/74 | HR 40 | Temp 97.6°F | Resp 20 | Wt 200.9 lb

## 2016-01-21 DIAGNOSIS — Z510 Encounter for antineoplastic radiation therapy: Secondary | ICD-10-CM | POA: Diagnosis not present

## 2016-01-21 DIAGNOSIS — C61 Malignant neoplasm of prostate: Secondary | ICD-10-CM | POA: Diagnosis not present

## 2016-01-21 NOTE — Progress Notes (Signed)
  Radiation Oncology         [phone]   Name: Corey [name] MRN: [id number]   Date: 01/21/2016  DOB: 02-15-1946     Weekly Radiation Therapy Management    ICD-9-CM ICD-10-CM   1. Prostate cancer ([place]) 185 C61     Current Dose: 46.8 Gy  Planned Dose:  78 Gy  Narrative The patient presents for routine under treatment assessment.  Patient notes some hesitancy of flow, and nocturia x 3. He denies dysuria and hematuria. He notes regular bowels and a good appetite. He denies dizziness, nausea, pain, and fatigue.  Set-up films were reviewed. The chart was checked.  Physical Findings  weight is 200 lb 14.4 oz (91.1 kg). His oral temperature is 97.6 F (36.4 C). His blood pressure is 136/74 and his pulse is 40 (abnormal). His respiration is 20 and oxygen saturation is 100%. .  Alert and in no acute distress. Pulse confirmed by exam.  Impression The patient is tolerating radiation.  Plan Continue treatment as planned. Advise patient to follow up with primary care physician for bradycardia         Rodman Key A. Tammi Klippel, M.D.  This document serves as a record of services personally performed by Tyler Pita, MD. It was created on his behalf by Bethann Humble, a trained medical scribe. The creation of this record is based on the scribe's personal observations and the provider's statements to them. This document has been checked and approved by the attending provider.

## 2016-01-21 NOTE — Progress Notes (Signed)
Weekly rad txs prostate 24/40 completed, no dysuria,hematuria,  Has some hesitancy of flow, nocturia x3,  Bowels regular, checked his heart rate manually 1 minute=4o, regular beats, no c/o dizzy ness, or nausea, no pain, appetite good, no fatigue,  10:46 AM BP 136/74 (BP Location: Left Arm, Patient Position: Sitting, Cuff Size: Normal)   Pulse (!) 40 Comment: manual count ,regular beats  Temp 97.6 F (36.4 C) (Oral)   Resp 20   Wt 200 lb 14.4 oz (91.1 kg)   SpO2 100% Comment: room air  BMI 32.43 kg/m   Wt Readings from Last 3 Encounters:  01/21/16 200 lb 14.4 oz (91.1 kg)  01/13/16 199 lb 14.4 oz (90.7 kg)  01/06/16 201 lb 4.8 oz (91.3 kg)

## 2016-01-23 ENCOUNTER — Telehealth: Payer: Self-pay | Admitting: Internal Medicine

## 2016-01-23 NOTE — Telephone Encounter (Signed)
Can you call him and have him come in this week.  His pulse has been on the low side when he goes to radiation oncology and we need to adjust his meds.

## 2016-01-24 ENCOUNTER — Ambulatory Visit
Admission: RE | Admit: 2016-01-24 | Discharge: 2016-01-24 | Disposition: A | Discharge status: Home / Self Care | Payer: Commercial Managed Care - HMO | Source: Ambulatory Visit | Attending: Radiation Oncology | Admitting: Radiation Oncology

## 2016-01-24 DIAGNOSIS — Z510 Encounter for antineoplastic radiation therapy: Secondary | ICD-10-CM | POA: Diagnosis not present

## 2016-01-24 DIAGNOSIS — C61 Malignant neoplasm of prostate: Secondary | ICD-10-CM | POA: Diagnosis not present

## 2016-01-24 NOTE — Telephone Encounter (Signed)
Spoke with pt, appt scheduled.  

## 2016-01-25 ENCOUNTER — Ambulatory Visit
Admission: RE | Admit: 2016-01-25 | Discharge: 2016-01-25 | Disposition: A | Discharge status: Home / Self Care | Payer: Commercial Managed Care - HMO | Source: Ambulatory Visit | Attending: Radiation Oncology | Admitting: Radiation Oncology

## 2016-01-25 DIAGNOSIS — C61 Malignant neoplasm of prostate: Secondary | ICD-10-CM | POA: Diagnosis not present

## 2016-01-25 DIAGNOSIS — Z510 Encounter for antineoplastic radiation therapy: Secondary | ICD-10-CM | POA: Diagnosis not present

## 2016-01-26 ENCOUNTER — Ambulatory Visit
Admission: RE | Admit: 2016-01-26 | Discharge: 2016-01-26 | Disposition: A | Discharge status: Home / Self Care | Payer: Commercial Managed Care - HMO | Source: Ambulatory Visit | Attending: Radiation Oncology | Admitting: Radiation Oncology

## 2016-01-26 DIAGNOSIS — Z510 Encounter for antineoplastic radiation therapy: Secondary | ICD-10-CM | POA: Diagnosis not present

## 2016-01-26 DIAGNOSIS — C61 Malignant neoplasm of prostate: Secondary | ICD-10-CM | POA: Diagnosis not present

## 2016-01-27 ENCOUNTER — Ambulatory Visit: Admission: RE | Admit: 2016-01-27 | Payer: Commercial Managed Care - HMO | Source: Ambulatory Visit

## 2016-01-28 ENCOUNTER — Ambulatory Visit
Admission: RE | Admit: 2016-01-28 | Discharge: 2016-01-28 | Disposition: A | Discharge status: Home / Self Care | Payer: Commercial Managed Care - HMO | Source: Ambulatory Visit | Attending: Radiation Oncology | Admitting: Radiation Oncology

## 2016-01-28 ENCOUNTER — Encounter: Payer: Self-pay | Admitting: Internal Medicine

## 2016-01-28 ENCOUNTER — Ambulatory Visit (INDEPENDENT_AMBULATORY_CARE_PROVIDER_SITE_OTHER): Payer: Commercial Managed Care - HMO | Admitting: Internal Medicine

## 2016-01-28 VITALS — BP 114/62 | HR 40 | Resp 16 | Wt 200.4 lb

## 2016-01-28 VITALS — BP 124/72 | HR 44 | Temp 98.0°F | Resp 16 | Wt 200.0 lb

## 2016-01-28 DIAGNOSIS — R001 Bradycardia, unspecified: Secondary | ICD-10-CM | POA: Insufficient documentation

## 2016-01-28 DIAGNOSIS — C61 Malignant neoplasm of prostate: Secondary | ICD-10-CM | POA: Diagnosis not present

## 2016-01-28 DIAGNOSIS — Z23 Encounter for immunization: Secondary | ICD-10-CM

## 2016-01-28 DIAGNOSIS — I10 Essential (primary) hypertension: Secondary | ICD-10-CM | POA: Diagnosis not present

## 2016-01-28 DIAGNOSIS — Z510 Encounter for antineoplastic radiation therapy: Secondary | ICD-10-CM | POA: Diagnosis not present

## 2016-01-28 NOTE — Progress Notes (Signed)
Weight stable. Heart rate low. Patient scheduled to follow up with PCP today about low heart rate. Patient denies feeling lightheaded, dizziness or weak at this time. Reports nocturia x 3. Denies dysuria or hematuria. Reports urinary frequency; voiding every hour. Reports on occasion he will get a strong urge to void but, when he gets to the bathroom he empties very little. Denies diarrhea. Denies fatigue.   BP 114/62 (BP Location: Left Arm, Patient Position: Sitting, Cuff Size: Normal)   Pulse (!) 40   Resp 16   Wt 200 lb 6.4 oz (90.9 kg)   SpO2 100%   BMI 32.35 kg/m  Wt Readings from Last 3 Encounters:  01/28/16 200 lb 6.4 oz (90.9 kg)  01/21/16 200 lb 14.4 oz (91.1 kg)  01/13/16 199 lb 14.4 oz (90.7 kg)

## 2016-01-28 NOTE — Progress Notes (Signed)
  Radiation Oncology         [phone]   Name: Corey [name] MRN: [id number]   Date: 01/28/2016  DOB: [birth year]     Weekly Radiation Therapy Management    ICD-9-CM ICD-10-CM   1. Prostate cancer ([place]) 185 C61     Current Dose: 54.6 Gy  Planned Dose:  78 Gy  Narrative The patient presents for routine under treatment assessment.  Patient scheduled to follow up with PCP today about low heart rate. Patient denies feeling lightheaded, dizzy, or weak at this time. Reports nocturia x 3. Denies dysuria or hematuria. Reports urinary frequency; voiding every hour. Reports on occasion he will get a strong urge to void, but when he gets to the bathroom he empties very little. Denies diarrhea or fatigue.  Set-up films were reviewed. The chart was checked.  Physical Findings  weight is 200 lb 6.4 oz (90.9 kg). His blood pressure is 114/62 and his pulse is 40 (abnormal). His respiration is 16 and oxygen saturation is 100%. .  Alert and in no acute distress.  Impression The patient is tolerating radiation.  Plan Continue treatment as planned. He will follow up with his primary care physician for bradycardia.    Sheral Apley Tammi Klippel, M.D.  This document serves as a record of services personally performed by Tyler Pita, MD. It was created on his behalf by Darcus Austin, a trained medical scribe. The creation of this record is based on the scribe's personal observations and the provider's statements to them. This document has been checked and approved by the attending provider.

## 2016-01-28 NOTE — Assessment & Plan Note (Signed)
BP well controlled, but decreased HR in 40's Decrease metoprolol to 150 mg from 200 mg  Refer to cardiology for further evaluation of bradycardia

## 2016-01-28 NOTE — Progress Notes (Signed)
Subjective:    Patient ID: [id number] HNAT, male    DOB: 1946/04/05, 70 y.o.   MRN: [id number]  HPI The patient is here for follow up for low HR - this was noted at the cancer center.  His HR has been consistently in the 40's.    Hypertension, bradycardia: He is taking his medication daily. He is compliant with a low sodium diet.  He denies chest pain, palpitations, edema, shortness of breath and regular headaches. He is walking some for exercise and feels tired at times, but nothing he feels is concerning or out of the ordinary. He does not monitor his blood pressure at home.      Medications and allergies reviewed with patient and updated if appropriate.  Patient Active Problem List   Diagnosis Date Noted  . Weak urinary stream 12/31/2015  . Chronic kidney disease 09/15/2015  . Ventral hernia without obstruction or gangrene 09/15/2015  . Prostate cancer ([place]) 07/25/2015  . Erectile dysfunction 05/04/2015  . Urinary frequency 05/04/2015  . 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 Abnormal finding on MRI of brain 02/14/2015  . PEA (Pulseless electrical activity) ([place]) 12/03/2012  . Diabetes ([place]) 06/30/2011  . Nonspecific abnormal electrocardiogram (ECG) (EKG) 12/15/2010  . GOUT 03/05/2007  . Essential hypertension 03/05/2007    Current Outpatient Prescriptions on File Prior to Visit  Medication Sig Dispense Refill  . amLODipine (NORVASC) 10 MG tablet TAKE 1 TABLET EVERY DAY 90 tablet 1  . cloNIDine (CATAPRES) 0.3 MG tablet Take 1 tablet (0.3 mg total) by mouth 2 (two) times daily. 60 tablet 5  . diazepam (VALIUM) 10 MG tablet     . glucose blood (ACCU-CHEK AVIVA PLUS) test strip Use as instructed to check blood sugars 2 times per day dx code E11.29 200 each 1  . Insulin Lispro Prot & Lispro (HUMALOG 75/25 MIX) (75-25) 100 UNIT/ML Kwikpen Inject 18 Units into the skin 2 (two) times daily. 24 mL   . isosorbide mononitrate (IMDUR) 60 MG 24 hr tablet TAKE 1  TABLET TWICE DAILY 180 tablet 1  . losartan (COZAAR) 100 MG tablet TAKE 1 TABLET EVERY DAY 90 tablet 1  . metoprolol succinate (TOPROL-XL) 100 MG 24 hr tablet TAKE 1 TABLET TWICE DAILY 180 tablet 1  . silodosin (RAPAFLO) 4 MG CAPS capsule Take 1 capsule (4 mg total) by mouth at bedtime. 30 capsule 6   No current facility-administered medications on file prior to visit.     Past Medical History:  Diagnosis Date  . Cataract   . Colon polyp 2003   Dr [name]; F/U was to be 2008( not completed)  . CVA (cerebral infarction) 2014  . Diabetes mellitus   . Diverticulosis 2003  . Gout   . Hypertension   . Myocardial infarction ([place]) 2014  . Pneumonia 09/29/11   Avelox X 10 days as OP  . Prostate cancer ([place])   . Renal insufficiency   . Seizures ([place])    not treated for seizure disorder; had a seizure after stroke 2014; no seizure since then    Past Surgical History:  Procedure Laterality Date  . COLONOSCOPY W/ POLYPECTOMY  2003   no F/U ([place] discussed 12/03/12)  . PEG PLACEMENT  2014  . PEG TUBE REMOVAL  2014  . PROSTATE BIOPSY    . REFRACTIVE SURGERY  2005  . TRACHEOSTOMY  2014   was closed after hospital stay  . wrist aspiration  02/16/12  monosodium urate crystals; Dr Caralyn Guile    Social History   Social History  . Marital status: Married    Spouse name: N/A  . Number of children: N/A  . Years of education: N/A   Social History Main Topics  . Smoking status: Former Smoker    Packs/day: 0.25    Years: 1.00    Types: Cigarettes    Quit date: 05/15/1968  . Smokeless tobacco: Never Used     Comment: smoked [place], up to 1-2 cigarettes/ day  . Alcohol use No  . Drug use: No  . Sexual activity: Yes   Other Topics Concern  . Not on file   Social History Narrative   Walks three times a week, 2-3 miles, occasional walks on treadmill        Family History  Problem Relation Age of Onset  . Hypertension Father   . Prostate cancer Father   . Diabetes Father     . Cancer Father   . Hypertension Mother   . Diabetes Mother   . Stroke Neg Hx   . Heart disease Neg Hx   . Colon cancer Neg Hx     Review of Systems  Constitutional: Negative for fever.  Respiratory: Positive for shortness of breath (occasional). Negative for cough and wheezing.   Cardiovascular: Negative for chest pain, palpitations and leg swelling.  Neurological: Positive for light-headedness (occasional) and headaches (seldom).       Objective:   Vitals:   01/28/16 1303  BP: 124/72  Pulse: (!) 44  Resp: 16  Temp: 98 F (36.7 C)   Filed Weights   01/28/16 1303  Weight: 200 lb (90.7 kg)   Body mass index is 32.28 kg/m.   Physical Exam    Constitutional: Appears well-developed and well-nourished. No distress.  HENT:  Head: Normocephalic and atraumatic.  Neck: Neck supple. No tracheal deviation present. No thyromegaly present.  Cardiovascular: Normal rate, regular rhythm and normal heart sounds.   2/6 systolic murmur heard.   No carotid bruit  Pulmonary/Chest: Effort normal and breath sounds normal. No respiratory distress. No has no wheezes. No rales.  Musculoskeletal: trace b/l LE edema.  Lymphadenopathy: No cervical adenopathy.  Skin: Skin is warm and dry. Not diaphoretic.  Psychiatric: Normal mood and affect. Behavior is normal.     Assessment & Plan:   Flu vaccine today  See Problem List for Assessment and Plan of chronic medical problems.

## 2016-01-28 NOTE — Patient Instructions (Addendum)
  All other Health Maintenance issues reviewed.   All recommended immunizations and age-appropriate screenings are up-to-date or discussed.  Flu vaccine administered today.   Medications reviewed and updated.  Changes include decreasing the metoprolol to 100 mg once a day and 50 mg the second dose.    A referral was ordered for cardiology.

## 2016-01-28 NOTE — Progress Notes (Signed)
Pre visit review using our clinic review tool, if applicable. No additional management support is needed unless otherwise documented below in the visit note. 

## 2016-01-28 NOTE — Assessment & Plan Note (Addendum)
Bradycardia is not new, but has worsened a little Likely medication induced, BP well controlled Lowest documented HR is 40 Asymptomatic  EKG today - first degree AV block Will decrease metoprolol to 150 mg daily instead of 200 mg daily, but will refer to Cardiology for their opinion  Advised him to call with any concerning symptoms

## 2016-01-31 ENCOUNTER — Ambulatory Visit
Admission: RE | Admit: 2016-01-31 | Discharge: 2016-01-31 | Disposition: A | Discharge status: Home / Self Care | Payer: Commercial Managed Care - HMO | Source: Ambulatory Visit | Attending: Radiation Oncology | Admitting: Radiation Oncology

## 2016-01-31 DIAGNOSIS — Z510 Encounter for antineoplastic radiation therapy: Secondary | ICD-10-CM | POA: Diagnosis not present

## 2016-01-31 DIAGNOSIS — C61 Malignant neoplasm of prostate: Secondary | ICD-10-CM | POA: Diagnosis not present

## 2016-02-01 ENCOUNTER — Ambulatory Visit
Admission: RE | Admit: 2016-02-01 | Discharge: 2016-02-01 | Disposition: A | Discharge status: Home / Self Care | Payer: Commercial Managed Care - HMO | Source: Ambulatory Visit | Attending: Radiation Oncology | Admitting: Radiation Oncology

## 2016-02-01 DIAGNOSIS — C61 Malignant neoplasm of prostate: Secondary | ICD-10-CM | POA: Diagnosis not present

## 2016-02-01 DIAGNOSIS — Z510 Encounter for antineoplastic radiation therapy: Secondary | ICD-10-CM | POA: Diagnosis not present

## 2016-02-02 ENCOUNTER — Ambulatory Visit
Admission: RE | Admit: 2016-02-02 | Discharge: 2016-02-02 | Disposition: A | Discharge status: Home / Self Care | Payer: Commercial Managed Care - HMO | Source: Ambulatory Visit | Attending: Radiation Oncology | Admitting: Radiation Oncology

## 2016-02-02 DIAGNOSIS — Z510 Encounter for antineoplastic radiation therapy: Secondary | ICD-10-CM | POA: Diagnosis not present

## 2016-02-02 DIAGNOSIS — C61 Malignant neoplasm of prostate: Secondary | ICD-10-CM | POA: Diagnosis not present

## 2016-02-03 ENCOUNTER — Ambulatory Visit (INDEPENDENT_AMBULATORY_CARE_PROVIDER_SITE_OTHER): Payer: Commercial Managed Care - HMO | Admitting: Endocrinology

## 2016-02-03 ENCOUNTER — Encounter: Payer: Self-pay | Admitting: Endocrinology

## 2016-02-03 ENCOUNTER — Ambulatory Visit
Admission: RE | Admit: 2016-02-03 | Discharge: 2016-02-03 | Disposition: A | Discharge status: Home / Self Care | Payer: Commercial Managed Care - HMO | Source: Ambulatory Visit | Attending: Radiation Oncology | Admitting: Radiation Oncology

## 2016-02-03 ENCOUNTER — Other Ambulatory Visit: Payer: Self-pay | Admitting: *Deleted

## 2016-02-03 VITALS — BP 126/82 | HR 58 | Temp 98.1°F | Resp 14 | Ht 66.0 in | Wt 200.4 lb

## 2016-02-03 DIAGNOSIS — C61 Malignant neoplasm of prostate: Secondary | ICD-10-CM | POA: Diagnosis not present

## 2016-02-03 DIAGNOSIS — E1165 Type 2 diabetes mellitus with hyperglycemia: Secondary | ICD-10-CM | POA: Diagnosis not present

## 2016-02-03 DIAGNOSIS — Z794 Long term (current) use of insulin: Secondary | ICD-10-CM | POA: Diagnosis not present

## 2016-02-03 DIAGNOSIS — Z510 Encounter for antineoplastic radiation therapy: Secondary | ICD-10-CM | POA: Diagnosis not present

## 2016-02-03 MED ORDER — METFORMIN HCL ER 750 MG PO TB24: 750.0000 mg | ORAL_TABLET | Freq: Every day | ORAL | 1 refills | Status: DC

## 2016-02-03 NOTE — Progress Notes (Signed)
Patient ID: Corey [name], male   DOB: Aug 12, 1945, 70 y.o.   MRN: [id number]   Reason for Appointment : Followup of Type 2 Diabetes   History of Present Illness          Diagnosis: Type 2 diabetes mellitus, date of diagnosis: 1997      Past history: The patient was initially treated with various oral hypoglycemic drugs and details are not available. About 2009 he was started on insulin and has either taken premixed insulin or Lantus once a day He had difficulty affording his Lantus insulin and prefers to take premixed insulin  Recent history:    INSULIN regimen is described as:  none  He has not been seen in follow-up for nearly a year  Current blood sugar patterns and problems:  He could not afford his insulin and stopped this a couple of months ago, he is not clear on the history  He also did not call to report that he could not get insulin  He also has not checked his blood sugars much and not clear if his test strips are out of date are not  He has lost weight since last year  His blood sugars are being checked sporadically and has mild increase in fasting readings, not checking after meals   He says that he is trying to walk although not as regularly as before He cannot afford Victoza which previously had helped his control and his weight Hypoglycemia : none    Glucose monitoring:  done less than one time a day         Glucometer:  Accu-Chek      Blood Glucose readings by monitor download: Range 133-171 with only 4 readings averaging 151   Self-care:       Meals: 3 meals per day. Egg in am at 9-10 am , sometimes sandwich at lunch. His largest meal is at suppertime at about 8 pm.  Physical activity: exercise: Walking 30-40 minutes; 2-3/7  days, usually 7 am       Dietician visit: Most recent: 02/2014               Oral hypoglycemic drugs the patient is taking are:  none        Compliance with the medical regimen:  Fair  Retinal exam: Most recent: about a year  ago .    Wt Readings from Last 3 Encounters:  02/03/16 200 lb 6.4 oz (90.9 kg)  01/28/16 200 lb 6.4 oz (90.9 kg)  01/28/16 200 lb (90.7 kg)    Lab Results  Component Value Date   HGBA1C 7.4 (H) 01/11/2016   HGBA1C 8.3 (H) 09/15/2015   HGBA1C 9.0 (H) 06/16/2015   Lab Results  Component Value Date   MICROALBUR 5.0 (H) 10/28/2014   LDLCALC 80 09/15/2015   CREATININE 1.48 01/11/2016   No visits with results within 1 Week(s) from this visit.  Latest known visit with results is:  Lab on 01/11/2016  Component Date Value Ref Range Status  . Hgb A1c MFr Bld 01/11/2016 7.4* 4.6 - 6.5 % Final  . Sodium 01/11/2016 141  135 - 145 mEq/L Final  . Potassium 01/11/2016 3.8  3.5 - 5.1 mEq/L Final  . Chloride 01/11/2016 112  96 - 112 mEq/L Final  . CO2 01/11/2016 23  19 - 32 mEq/L Final  . Glucose, Bld 01/11/2016 149* 70 - 99 mg/dL Final  . BUN 01/11/2016 27* 6 - 23 mg/dL Final  . Creatinine,  Ser 01/11/2016 1.48  0.40 - 1.50 mg/dL Final  . Calcium 01/11/2016 9.5  8.4 - 10.5 mg/dL Final  . GFR 01/11/2016 60.84  >60.00 mL/min Final       Medication List       Accurate as of 02/03/16  9:04 PM. Always use your most recent med list.          amLODipine 10 MG tablet Commonly known as:  NORVASC TAKE 1 TABLET EVERY DAY   cloNIDine 0.3 MG tablet Commonly known as:  CATAPRES Take 1 tablet (0.3 mg total) by mouth 2 (two) times daily.   diazepam 10 MG tablet Commonly known as:  VALIUM   glucose blood test strip Commonly known as:  ACCU-CHEK AVIVA PLUS Use as instructed to check blood sugars 2 times per day dx code E11.29   isosorbide mononitrate 60 MG 24 hr tablet Commonly known as:  IMDUR TAKE 1 TABLET TWICE DAILY   losartan 100 MG tablet Commonly known as:  COZAAR TAKE 1 TABLET EVERY DAY   metFORMIN 750 MG 24 hr tablet Commonly known as:  GLUCOPHAGE-XR Take 1 tablet (750 mg total) by mouth daily with supper.   metoprolol succinate 100 MG 24 hr tablet Commonly known as:   TOPROL-XL TAKE 1 TABLET TWICE DAILY   silodosin 4 MG Caps capsule Commonly known as:  RAPAFLO Take 1 capsule (4 mg total) by mouth at bedtime.       Allergies:  Allergies  Allergen Reactions  . Hydrochlorothiazide     Gout , uncontrolled diabetes and renal insufficiency  . Allopurinol     Renal insufficiency    Past Medical History:  Diagnosis Date  . Cataract   . Colon polyp 2003   Dr [name]; F/U was to be 2008( not completed)  . CVA (cerebral infarction) 2014  . Diabetes mellitus   . Diverticulosis 2003  . Gout   . Hypertension   . Myocardial infarction ([place]) 2014  . Pneumonia 09/29/11   Avelox X 10 days as OP  . Prostate cancer ([place])   . Renal insufficiency   . Seizures ([place])    not treated for seizure disorder; had a seizure after stroke 2014; no seizure since then    Past Surgical History:  Procedure Laterality Date  . COLONOSCOPY W/ POLYPECTOMY  2003   no F/U ([place] discussed 12/03/12)  . PEG PLACEMENT  2014  . PEG TUBE REMOVAL  2014  . PROSTATE BIOPSY    . REFRACTIVE SURGERY  2005  . TRACHEOSTOMY  2014   was closed after hospital stay  . wrist aspiration  02/16/12    monosodium urate crystals; Dr Caralyn Guile    Family History  Problem Relation Age of Onset  . Hypertension Father   . Prostate cancer Father   . Diabetes Father   . Cancer Father   . Hypertension Mother   . Diabetes Mother   . Stroke Neg Hx   . Heart disease Neg Hx   . Colon cancer Neg Hx     Social History:  reports that he quit smoking about 47 years ago. His smoking use included Cigarettes. He has a 0.25 pack-year smoking history. He has never used smokeless tobacco. He reports that he does not drink alcohol or use drugs.    Review of Systems   Lipids: Normal and currently not on a statin drug  Lab Results  Component Value Date   CHOL 141 09/15/2015   HDL 49.60 09/15/2015   LDLCALC 80  09/15/2015   TRIG 56.0 09/15/2015   CHOLHDL 3 09/15/2015    Eye exams: Irregular     He has mild renal insufficiency of unclear etiology with variable creatinine levels, now near normal   Lab Results  Component Value Date   CREATININE 1.48 01/11/2016   HYPERTENSION: He has not followed up with his PCP  again as directed for blood pressure management  He thinks he is taking all his medications as directed He thinks he is taking his clonidine 3 times a day without side effects.  Also taking Norvasc and metoprolol 100 mg   Physical Examination:  BP 126/82   Pulse (!) 58   Temp 98.1 F (36.7 C)   Resp 14   Ht 5\' 6"  (1.676 m)   Wt 200 lb 6.4 oz (90.9 kg)   SpO2 98%   BMI 32.35 kg/m    no pedal edema   ASSESSMENT/PLAN:  Diabetes type 2, uncontrolled   Blood sugars are again somewhat high despite increasing his insulin. He appears to have relatively high readings before supper and has a long interval of time between breakfast and supper. Currently not covering his lunch with any mealtime insulin even though sometimes he has significant amount of carbohydrate His last A1c was 9% using Victoza 1.8 mg and premixed insulin Not clear.  Sugars are higher with stopping Victoza but he cannot afford this now  Have recommended adding a dose of insulin at lunchtime to cover his lunch meal and improved blood sugars before supper also.  Instructions as below Not clear if he needs additional insulin at supper time since he is not monitoring postprandial readings much and only occasional fasting readings He has maintained his weight with regular exercise Consider restarting Victoza next year when he may be able to afford it    Patient Instructions  Check blood sugars on waking up  2-3x per week  Also check blood sugars about 2 hours after a meal and do this after different meals by rotation  Recommended blood sugar levels on waking up is 90-130 and about 2 hours after meal is 130-160  Please bring your blood sugar monitor to each visit, thank you  Take Metformin with  supper    Tyshell Ramberg 02/03/2016, 9:04 PM   UNITS Humalog mix bid    Glucose monitoring:  done less than one time a day         Glucometer:  Accu-Chek      Blood Glucose readings 120 am us> 200  PRE-MEAL Breakfast Lunch Dinner Bedtime Overall  Glucose range:       Mean/median:        POST-MEAL PC Breakfast PC Lunch PC Dinner  Glucose range:     Mean/median:        PRE-MEAL Breakfast  afternoon  Dinner Bedtime  average   Glucose range:  151-167   172, 176   96-243  ?    169    Self-care:       Meals: 3 meals per day. Egg in am at 10 am , sometimes sandwich at lunch. His largest meal is at suppertime at about 8 pm.  Physical activity: exercise: Walking 30-40 minutes; 3/7  days, usually 7 am       Dietician visit: Most recent: 02/2014               Oral hypoglycemic drugs the patient is taking are:  none        Compliance with the medical regimen:  Fair  Retinal exam: Most recent: about a year ago .    Wt Readings from Last 3 Encounters:  02/03/16 200 lb 6.4 oz (90.9 kg)  01/28/16 200 lb 6.4 oz (90.9 kg)  01/28/16 200 lb (90.7 kg)    Lab Results  Component Value Date   HGBA1C 7.4 (H) 01/11/2016   HGBA1C 8.3 (H) 09/15/2015   HGBA1C 9.0 (H) 06/16/2015   Lab Results  Component Value Date   MICROALBUR 5.0 (H) 10/28/2014   LDLCALC 80 09/15/2015   CREATININE 1.48 01/11/2016   No visits with results within 1 Week(s) from this visit.  Latest known visit with results is:  Lab on 01/11/2016  Component Date Value Ref Range Status  . Hgb A1c MFr Bld 01/11/2016 7.4* 4.6 - 6.5 % Final  . Sodium 01/11/2016 141  135 - 145 mEq/L Final  . Potassium 01/11/2016 3.8  3.5 - 5.1 mEq/L Final  . Chloride 01/11/2016 112  96 - 112 mEq/L Final  . CO2 01/11/2016 23  19 - 32 mEq/L Final  . Glucose, Bld 01/11/2016 149* 70 - 99 mg/dL Final  . BUN 01/11/2016 27* 6 - 23 mg/dL Final  . Creatinine, Ser 01/11/2016 1.48  0.40 - 1.50 mg/dL Final  . Calcium 01/11/2016 9.5  8.4 - 10.5 mg/dL Final    . GFR 01/11/2016 60.84  >60.00 mL/min Final       Medication List       Accurate as of 02/03/16  9:04 PM. Always use your most recent med list.          amLODipine 10 MG tablet Commonly known as:  NORVASC TAKE 1 TABLET EVERY DAY   cloNIDine 0.3 MG tablet Commonly known as:  CATAPRES Take 1 tablet (0.3 mg total) by mouth 2 (two) times daily.   diazepam 10 MG tablet Commonly known as:  VALIUM   glucose blood test strip Commonly known as:  ACCU-CHEK AVIVA PLUS Use as instructed to check blood sugars 2 times per day dx code E11.29   isosorbide mononitrate 60 MG 24 hr tablet Commonly known as:  IMDUR TAKE 1 TABLET TWICE DAILY   losartan 100 MG tablet Commonly known as:  COZAAR TAKE 1 TABLET EVERY DAY   metFORMIN 750 MG 24 hr tablet Commonly known as:  GLUCOPHAGE-XR Take 1 tablet (750 mg total) by mouth daily with supper.   metoprolol succinate 100 MG 24 hr tablet Commonly known as:  TOPROL-XL TAKE 1 TABLET TWICE DAILY   silodosin 4 MG Caps capsule Commonly known as:  RAPAFLO Take 1 capsule (4 mg total) by mouth at bedtime.       Allergies:  Allergies  Allergen Reactions  . Hydrochlorothiazide     Gout , uncontrolled diabetes and renal insufficiency  . Allopurinol     Renal insufficiency    Past Medical History:  Diagnosis Date  . Cataract   . Colon polyp 2003   Dr [name]; F/U was to be 2008( not completed)  . CVA (cerebral infarction) 2014  . Diabetes mellitus   . Diverticulosis 2003  . Gout   . Hypertension   . Myocardial infarction ([name]) 2014  . Pneumonia 09/29/11   Avelox X 10 days as OP  . Prostate cancer ([place])   . Renal insufficiency   . Seizures ([place])    not treated for seizure disorder; had a seizure after stroke 2014; no seizure since then    Past Surgical History:  Procedure Laterality Date  . COLONOSCOPY W/  POLYPECTOMY  2003   no F/U ([place] discussed 12/03/12)  . PEG PLACEMENT  2014  . PEG TUBE REMOVAL  2014  . PROSTATE  BIOPSY    . REFRACTIVE SURGERY  2005  . TRACHEOSTOMY  2014   was closed after hospital stay  . wrist aspiration  02/16/12    monosodium urate crystals; Dr Caralyn Guile    Family History  Problem Relation Age of Onset  . Hypertension Father   . Prostate cancer Father   . Diabetes Father   . Cancer Father   . Hypertension Mother   . Diabetes Mother   . Stroke Neg Hx   . Heart disease Neg Hx   . Colon cancer Neg Hx     Social History:  reports that he quit smoking about 47 years ago. His smoking use included Cigarettes. He has a 0.25 pack-year smoking history. He has never used smokeless tobacco. He reports that he does not drink alcohol or use drugs.    Review of Systems   Lipids: Normal and currently not on a statin drug  Lab Results  Component Value Date   CHOL 141 09/15/2015   HDL 49.60 09/15/2015   LDLCALC 80 09/15/2015   TRIG 56.0 09/15/2015   CHOLHDL 3 09/15/2015    Eye exams: 10/16    He has  renal insufficiency of unclear etiology with variable creatinine levels, now appeared improved   Lab Results  Component Value Date   CREATININE 1.48 01/11/2016   HYPERTENSION: He thinks he is taking  His medications consistently  Currently is taking Norvasc , clonidine , losartan and metoprolol 100 mg As before   last diabetic foot exam was done in 7/16  Physical Examination:  BP 126/82   Pulse (!) 58   Temp 98.1 F (36.7 C)   Resp 14   Ht 5\' 6"  (1.676 m)   Wt 200 lb 6.4 oz (90.9 kg)   SpO2 98%   BMI 32.35 kg/m    ASSESSMENT/PLAN:  Diabetes type 2, previously requiring insulin  See history of present illness for detailed discussion of current diabetes management, blood sugar patterns and problems identified  Blood sugars are surprisingly not high even though he has stopped taking insulin a couple months ago  He has lost weight and this may be improving his insulin sensitivity He has difficulties with affording even premixed insulin and has limited  choices  Since his creatinine is improved he can retry metformin ER 750 mg daily Discussed checking blood sugars more consistently after meals and to make sure his test strips are not expired   HYPERTENSION:   Blood pressure is improved  Continue follow-up with PCP for other problems  Patient Instructions  Check blood sugars on waking up  2-3x per week  Also check blood sugars about 2 hours after a meal and do this after different meals by rotation  Recommended blood sugar levels on waking up is 90-130 and about 2 hours after meal is 130-160  Please bring your blood sugar monitor to each visit, thank you  Take Metformin with supper    Raidyn Breiner 02/03/2016, 9:04 PM

## 2016-02-03 NOTE — Patient Instructions (Addendum)
Check blood sugars on waking up  2-3x per week  Also check blood sugars about 2 hours after a meal and do this after different meals by rotation  Recommended blood sugar levels on waking up is 90-130 and about 2 hours after meal is 130-160  Please bring your blood sugar monitor to each visit, thank you  Take Metformin with supper

## 2016-02-04 ENCOUNTER — Encounter: Payer: Self-pay | Admitting: Radiation Oncology

## 2016-02-04 ENCOUNTER — Ambulatory Visit
Admission: RE | Admit: 2016-02-04 | Discharge: 2016-02-04 | Disposition: A | Discharge status: Home / Self Care | Payer: Commercial Managed Care - HMO | Source: Ambulatory Visit | Attending: Radiation Oncology | Admitting: Radiation Oncology

## 2016-02-04 VITALS — BP 137/84 | HR 41 | Resp 16 | Wt 201.0 lb

## 2016-02-04 DIAGNOSIS — Z510 Encounter for antineoplastic radiation therapy: Secondary | ICD-10-CM | POA: Diagnosis not present

## 2016-02-04 DIAGNOSIS — C61 Malignant neoplasm of prostate: Secondary | ICD-10-CM

## 2016-02-04 NOTE — Progress Notes (Signed)
   Department of Radiation Oncology  Phone:  [phone] Fax:        [phone]  Weekly Treatment Note    Name: Corey [name] Date: 02/04/2016 MRN: [id number] DOB: 19-Jun-1945   Diagnosis:  No diagnosis found.   Current dose: 64.3 Gy  Current fraction:33   MEDICATIONS: Current Outpatient Prescriptions  Medication Sig Dispense Refill  . amLODipine (NORVASC) 10 MG tablet TAKE 1 TABLET EVERY DAY 90 tablet 1  . cloNIDine (CATAPRES) 0.3 MG tablet Take 1 tablet (0.3 mg total) by mouth 2 (two) times daily. 60 tablet 5  . diazepam (VALIUM) 10 MG tablet     . glucose blood (ACCU-CHEK AVIVA PLUS) test strip Use as instructed to check blood sugars 2 times per day dx code E11.29 200 each 1  . isosorbide mononitrate (IMDUR) 60 MG 24 hr tablet TAKE 1 TABLET TWICE DAILY 180 tablet 1  . losartan (COZAAR) 100 MG tablet TAKE 1 TABLET EVERY DAY 90 tablet 1  . metFORMIN (GLUCOPHAGE-XR) 750 MG 24 hr tablet Take 1 tablet (750 mg total) by mouth daily with supper. 30 tablet 1  . metoprolol succinate (TOPROL-XL) 100 MG 24 hr tablet TAKE 1 TABLET TWICE DAILY 180 tablet 1  . silodosin (RAPAFLO) 4 MG CAPS capsule Take 1 capsule (4 mg total) by mouth at bedtime. 30 capsule 6   No current facility-administered medications for this encounter.      ALLERGIES: Hydrochlorothiazide and Allopurinol   LABORATORY DATA:  Lab Results  Component Value Date   WBC 4.4 06/16/2015   HGB 12.9 (L) 06/16/2015   HCT 40.0 06/16/2015   MCV 80.2 06/16/2015   PLT 197.0 06/16/2015   Lab Results  Component Value Date   NA 141 01/11/2016   K 3.8 01/11/2016   CL 112 01/11/2016   CO2 23 01/11/2016   Lab Results  Component Value Date   ALT 10 09/15/2015   AST 15 09/15/2015   ALKPHOS 38 (L) 09/15/2015   BILITOT 0.5 09/15/2015     NARRATIVE: Corey [name] was seen today for weekly treatment management. The chart was checked and the patient's films were reviewed.  Weight stable. Heart rate continues to be low.  Reports nocturia x 3. Denies dysuria or hematuria. Reports intermittent urinary frequency. Reports on occasion, he has a strong urge to void. However, he only empties a little urine. He denies leakage or incontinence. Reports urgency. Denies diarrhea or fatigue.  PHYSICAL EXAMINATION: weight is 201 lb (91.2 kg). His blood pressure is 137/84 and his pulse is 41 (abnormal). His respiration is 16 and oxygen saturation is 100%.        ASSESSMENT: The patient is doing satisfactorily with treatment.  PLAN: We will continue with the patient's radiation treatment as planned.        This document serves as a record of services personally performed by Kyung Rudd, MD. It was created on his behalf by Bethann Humble, a trained medical scribe. The creation of this record is based on the scribe's personal observations and the provider's statements to them. This document has been checked and approved by the attending provider.

## 2016-02-04 NOTE — Progress Notes (Signed)
Weight stable. Heart rate continues to be low. Reports nocturia x 3. Denies dysuria or hematuria. Reports intermittent urinary frequency. Reports on occasion he has a strong urge to void but, only empties a little urine. Denies leakage or incontinence. Reports urgency. Denies diarrhea or fatigue.   BP 137/84 (BP Location: Left Arm, Patient Position: Sitting, Cuff Size: Normal)   Pulse (!) 41   Resp 16   Wt 201 lb (91.2 kg)   SpO2 100%   BMI 32.44 kg/m  Wt Readings from Last 3 Encounters:  02/04/16 201 lb (91.2 kg)  02/03/16 200 lb 6.4 oz (90.9 kg)  01/28/16 200 lb 6.4 oz (90.9 kg)

## 2016-02-07 ENCOUNTER — Ambulatory Visit
Admission: RE | Admit: 2016-02-07 | Discharge: 2016-02-07 | Disposition: A | Discharge status: Home / Self Care | Payer: Commercial Managed Care - HMO | Source: Ambulatory Visit | Attending: Radiation Oncology | Admitting: Radiation Oncology

## 2016-02-07 DIAGNOSIS — C61 Malignant neoplasm of prostate: Secondary | ICD-10-CM | POA: Diagnosis not present

## 2016-02-07 DIAGNOSIS — Z510 Encounter for antineoplastic radiation therapy: Secondary | ICD-10-CM | POA: Diagnosis not present

## 2016-02-08 ENCOUNTER — Ambulatory Visit
Admission: RE | Admit: 2016-02-08 | Discharge: 2016-02-08 | Disposition: A | Discharge status: Home / Self Care | Payer: Commercial Managed Care - HMO | Source: Ambulatory Visit | Attending: Radiation Oncology | Admitting: Radiation Oncology

## 2016-02-08 DIAGNOSIS — C61 Malignant neoplasm of prostate: Secondary | ICD-10-CM | POA: Diagnosis not present

## 2016-02-08 DIAGNOSIS — Z510 Encounter for antineoplastic radiation therapy: Secondary | ICD-10-CM | POA: Diagnosis not present

## 2016-02-09 ENCOUNTER — Ambulatory Visit
Admission: RE | Admit: 2016-02-09 | Discharge: 2016-02-09 | Disposition: A | Discharge status: Home / Self Care | Payer: Commercial Managed Care - HMO | Source: Ambulatory Visit | Attending: Radiation Oncology | Admitting: Radiation Oncology

## 2016-02-09 DIAGNOSIS — C61 Malignant neoplasm of prostate: Secondary | ICD-10-CM | POA: Diagnosis not present

## 2016-02-09 DIAGNOSIS — Z510 Encounter for antineoplastic radiation therapy: Secondary | ICD-10-CM | POA: Diagnosis not present

## 2016-02-10 ENCOUNTER — Ambulatory Visit
Admission: RE | Admit: 2016-02-10 | Discharge: 2016-02-10 | Disposition: A | Discharge status: Home / Self Care | Payer: Commercial Managed Care - HMO | Source: Ambulatory Visit | Attending: Radiation Oncology | Admitting: Radiation Oncology

## 2016-02-10 DIAGNOSIS — Z510 Encounter for antineoplastic radiation therapy: Secondary | ICD-10-CM | POA: Diagnosis not present

## 2016-02-10 DIAGNOSIS — C61 Malignant neoplasm of prostate: Secondary | ICD-10-CM | POA: Diagnosis not present

## 2016-02-11 ENCOUNTER — Encounter: Payer: Self-pay | Admitting: Radiation Oncology

## 2016-02-11 ENCOUNTER — Ambulatory Visit
Admission: RE | Admit: 2016-02-11 | Discharge: 2016-02-11 | Disposition: A | Discharge status: Home / Self Care | Payer: Commercial Managed Care - HMO | Source: Ambulatory Visit | Attending: Radiation Oncology | Admitting: Radiation Oncology

## 2016-02-11 VITALS — BP 133/79 | HR 77 | Temp 97.7°F | Resp 18 | Ht 66.0 in | Wt 199.8 lb

## 2016-02-11 DIAGNOSIS — C61 Malignant neoplasm of prostate: Secondary | ICD-10-CM | POA: Diagnosis not present

## 2016-02-11 DIAGNOSIS — Z510 Encounter for antineoplastic radiation therapy: Secondary | ICD-10-CM | POA: Diagnosis not present

## 2016-02-11 NOTE — Progress Notes (Signed)
  Radiation Oncology         [phone]   Name: Corey [name] MRN: [id number]   Date: 02/11/2016  DOB: 08-27-1945     Weekly Radiation Therapy Management    ICD-9-CM ICD-10-CM   1. Prostate cancer ([place]) 185 C61     Current Dose: 74.1 Gy  Planned Dose:  78 Gy  Narrative The patient presents for routine under treatment assessment.  Reports nocturia x 3. Denies dysuria or hematuria. Reports intermittent urinary frequency. Reports on occasion he has a strong urge to void, but only empties a little urine. Denies leakage or incontinence. Reports urgency. Denies diarrhea, having daily normal bowel movements.  Denies  fatigue.    Set-up films were reviewed. The chart was checked.  Physical Findings  height is 5\' 6"  (1.676 m) and weight is 199 lb 12.8 oz (90.6 kg). His oral temperature is 97.7 F (36.5 C). His blood pressure is 133/79 and his pulse is 77. His respiration is 18 and oxygen saturation is 99%. .  Alert and in no acute distress.  Impression The patient is tolerating radiation.  Plan Continue treatment as planned.  EOT instructions given for a one month follow up and card to see Shona Simpson, P.A. on 03-28-16.    Sheral Apley Tammi Klippel, M.D.  This document serves as a record of services personally performed by Tyler Pita, MD. It was created on his behalf by Darcus Austin, a trained medical scribe. The creation of this record is based on the scribe's personal observations and the provider's statements to them. This document has been checked and approved by the attending provider.

## 2016-02-11 NOTE — Progress Notes (Signed)
Weight stable. Heart rate continues to be low. Reports nocturia x 3. Denies dysuria or hematuria. Reports intermittent urinary frequency. Reports on occasion he has a strong urge to void but, only empties a little urine. Denies leakage or incontinence. Reports urgency. Denies diarrhea , having daily normal bowel movements.  Denies  fatigue.   EOT instructions given one month card to see Shona Simpson, P.A. 03-28-16. Wt Readings from Last 3 Encounters:  02/11/16 199 lb 12.8 oz (90.6 kg)  02/04/16 201 lb (91.2 kg)  02/03/16 200 lb 6.4 oz (90.9 kg)  BP 133/79 (BP Location: Right Arm, Patient Position: Sitting, Cuff Size: Large)   Pulse 77   Temp 97.7 F (36.5 C) (Oral)   Resp 18   Ht 5\' 6"  (1.676 m)   Wt 199 lb 12.8 oz (90.6 kg)   SpO2 99%   BMI 32.25 kg/m

## 2016-02-14 ENCOUNTER — Ambulatory Visit: Payer: Commercial Managed Care - HMO

## 2016-02-14 ENCOUNTER — Ambulatory Visit
Admission: RE | Admit: 2016-02-14 | Discharge: 2016-02-14 | Disposition: A | Discharge status: Home / Self Care | Payer: Commercial Managed Care - HMO | Source: Ambulatory Visit | Attending: Radiation Oncology | Admitting: Radiation Oncology

## 2016-02-14 DIAGNOSIS — C61 Malignant neoplasm of prostate: Secondary | ICD-10-CM | POA: Diagnosis not present

## 2016-02-14 DIAGNOSIS — Z510 Encounter for antineoplastic radiation therapy: Secondary | ICD-10-CM | POA: Diagnosis not present

## 2016-02-15 ENCOUNTER — Ambulatory Visit
Admission: RE | Admit: 2016-02-15 | Discharge: 2016-02-15 | Disposition: A | Discharge status: Home / Self Care | Payer: Commercial Managed Care - HMO | Source: Ambulatory Visit | Attending: Radiation Oncology | Admitting: Radiation Oncology

## 2016-02-15 ENCOUNTER — Encounter: Payer: Self-pay | Admitting: Radiation Oncology

## 2016-02-15 ENCOUNTER — Ambulatory Visit: Payer: Commercial Managed Care - HMO

## 2016-02-15 ENCOUNTER — Encounter: Payer: Self-pay | Admitting: Medical Oncology

## 2016-02-15 DIAGNOSIS — Z510 Encounter for antineoplastic radiation therapy: Secondary | ICD-10-CM | POA: Diagnosis not present

## 2016-02-15 DIAGNOSIS — C61 Malignant neoplasm of prostate: Secondary | ICD-10-CM | POA: Diagnosis not present

## 2016-02-15 NOTE — Progress Notes (Signed)
Celebrated with Mr. Borre as he rang the bell after completing radiation treatments. He will follow up with Shona Simpson, PA 03/28/16.

## 2016-02-17 NOTE — Progress Notes (Signed)
  Radiation Oncology         [phone] ________________________________  Name: Corey [name] MRN: [id number]  Date: 02/15/2016  DOB: July 24, 1945  End of Treatment Note  Diagnosis:   70 y.o. gentleman with stage T1c adenocarcinoma of the prostate with a Gleason's score of 4+3 and a PSA of 4.48     Indication for treatment:  Curative, Definitive Radiotherapy       Radiation treatment dates:   12/20/2015 to 02/15/2016  Site/dose:   The prostate was treated to 78 Gy in 40 fractions of 1.95 Gy  Beams/energy:   The patient was treated with IMRT using volumetric arc therapy delivering 6 MV X-rays to clockwise and counterclockwise circumferential arcs with a 90 degree collimator offset to avoid dose scalloping.  Image guidance was performed with daily cone beam CT prior to each fraction to align to gold markers in the prostate and assure proper bladder and rectal fill volumes.  Immobilization was achieved with BodyFix custom mold.  Narrative: The patient tolerated radiation treatment relatively well.   The patient experienced some minor urinary irritation including, nocturia x 3 and incomplete emptying.  Plan: The patient has completed radiation treatment. He will return to radiation oncology clinic for routine followup in one month. I advised him to call or return sooner if he has any questions or concerns related to his recovery or treatment. ________________________________  Sheral Apley. Tammi Klippel, M.D.  This document serves as a record of services personally performed by Tyler Pita, MD. It was created on his behalf by Arlyce Harman, a trained medical scribe. The creation of this record is based on the scribe's personal observations and the provider's statements to them. This document has been checked and approved by the attending provider.

## 2016-02-29 ENCOUNTER — Telehealth: Payer: Self-pay | Admitting: Radiation Oncology

## 2016-02-29 NOTE — Telephone Encounter (Signed)
Phoned patient a second time to follow up on voicemail left reference urinary frequency. Patient completed prostate radiation on 02/15/16 receiving 78 Gy in 40 fx. Patient reports urinary frequency, nocturia x 4, urgency and occasional leakage. Denies dysuria or hematuria. Reports he ran out of rapaflo one week ago. Explains he is unable to pick up the refill because he has no money. Patient reports he doesn't have an appointment arranged with his urologist nor has he been seen by him lately. Confirmed patient plans to follow up with Shona Simpson, PA-C on 03/28/16. Patient understands this RN will inform Bryson Ha of these findings and call him back with further directions.

## 2016-03-01 ENCOUNTER — Telehealth: Payer: Self-pay | Admitting: Medical Oncology

## 2016-03-01 ENCOUNTER — Encounter: Payer: Self-pay | Admitting: Medical Oncology

## 2016-03-01 NOTE — Progress Notes (Signed)
I received word from Sam- Dr. Johny Shears nurse pt is having nocturia x 4 and urgency. He was taking Rapaflo but he ran out and does not have the money to get filled. I called [facility] and spoke with Roselyn Reef and she has 2 bottles that Mr. Ackerley can pick up. I called patient and he voiced understanding.

## 2016-03-01 NOTE — Telephone Encounter (Signed)
I spoke with [name] about Rapaflo. He states that the 90 days supply cost $149.00 and he is not able to afford. He asked for a 30 supply which cost $49.00 and plans to get Friday. I will call [facility] to see if they have samples and I will call him back. He voiced understanding.

## 2016-03-13 ENCOUNTER — Telehealth: Payer: Self-pay

## 2016-03-13 ENCOUNTER — Telehealth: Payer: Self-pay | Admitting: Endocrinology

## 2016-03-13 DIAGNOSIS — H2513 Age-related nuclear cataract, bilateral: Secondary | ICD-10-CM | POA: Diagnosis not present

## 2016-03-13 DIAGNOSIS — H47213 Primary optic atrophy, bilateral: Secondary | ICD-10-CM | POA: Diagnosis not present

## 2016-03-13 DIAGNOSIS — H4720 Unspecified optic atrophy: Secondary | ICD-10-CM | POA: Diagnosis not present

## 2016-03-13 NOTE — Telephone Encounter (Signed)
[facility], any doctor there.

## 2016-03-13 NOTE — Telephone Encounter (Signed)
Pt called in a requests call back from Dr. Ronnie Derby nurse.  He said that he is in need of a foot doctor and wanted to know who Dr. Dwyane Dee suggests.

## 2016-03-13 NOTE — Telephone Encounter (Signed)
Called and gave Dr.Kumar recommendation for foot doctor, gave patient number to doctor offices. No other questions at this time.

## 2016-03-14 ENCOUNTER — Telehealth: Payer: Self-pay | Admitting: Internal Medicine

## 2016-03-14 ENCOUNTER — Telehealth: Payer: Self-pay | Admitting: *Deleted

## 2016-03-14 DIAGNOSIS — Z794 Long term (current) use of insulin: Principal | ICD-10-CM

## 2016-03-14 DIAGNOSIS — E1122 Type 2 diabetes mellitus with diabetic chronic kidney disease: Secondary | ICD-10-CM

## 2016-03-14 NOTE — Telephone Encounter (Signed)
Referral ordered

## 2016-03-14 NOTE — Telephone Encounter (Signed)
Pt left name, DOB and phone number only. Unable to leave a message phone rang over 1 1/2 minutes without answering service.

## 2016-03-14 NOTE — Telephone Encounter (Signed)
Patient is requesting a referral to see a foot doctor. All he has is the number of the place [phone]. He can not remember where or who the doctor is. Please follow up with patient. Thank you.

## 2016-03-16 ENCOUNTER — Ambulatory Visit (INDEPENDENT_AMBULATORY_CARE_PROVIDER_SITE_OTHER): Payer: Commercial Managed Care - HMO | Admitting: Podiatry

## 2016-03-16 ENCOUNTER — Ambulatory Visit (INDEPENDENT_AMBULATORY_CARE_PROVIDER_SITE_OTHER): Payer: Commercial Managed Care - HMO

## 2016-03-16 ENCOUNTER — Encounter: Payer: Self-pay | Admitting: Podiatry

## 2016-03-16 VITALS — BP 196/97 | HR 57 | Resp 16

## 2016-03-16 DIAGNOSIS — M2041 Other hammer toe(s) (acquired), right foot: Secondary | ICD-10-CM

## 2016-03-16 DIAGNOSIS — E119 Type 2 diabetes mellitus without complications: Secondary | ICD-10-CM

## 2016-03-16 DIAGNOSIS — Q828 Other specified congenital malformations of skin: Secondary | ICD-10-CM | POA: Diagnosis not present

## 2016-03-16 DIAGNOSIS — B351 Tinea unguium: Secondary | ICD-10-CM

## 2016-03-16 DIAGNOSIS — M2042 Other hammer toe(s) (acquired), left foot: Secondary | ICD-10-CM

## 2016-03-16 DIAGNOSIS — M2010 Hallux valgus (acquired), unspecified foot: Secondary | ICD-10-CM | POA: Diagnosis not present

## 2016-03-16 DIAGNOSIS — M79676 Pain in unspecified toe(s): Secondary | ICD-10-CM

## 2016-03-16 DIAGNOSIS — M2040 Other hammer toe(s) (acquired), unspecified foot: Secondary | ICD-10-CM | POA: Diagnosis not present

## 2016-03-16 NOTE — Progress Notes (Signed)
   Subjective:    Patient ID: Corey [name], male    DOB: 1945-06-13, 70 y.o.   MRN: [id number]  HPI: He presents today with his wife chief concern of a thick dark toenail hallux right. He is a diabetic and she is concerned that this may be the beginning of something bad. He states that his toes overlap and cause pain.    Review of Systems  All other systems reviewed and are negative.      Objective:   Physical Exam: Vital signs are stable alert and oriented 3. Pulses are palpable. Neurologic sensorium is intact. Deep tendon reflexes are intact. Muscle strength is 5 over 5 dorsiflexion plantar flexors and inverters everters all intrinsic musculature is intact. Orthopedic evaluation and x-rays all joints distal to the ankle for range of motion without crepitation. Cutaneous evaluation of a straight supple well-hydrated cutis thick yellow dystrophic onychomycotic nails. Subungual hematoma hallux right. Orthopedic evaluation does demonstrate hammertoe deformities and hallux valgus deformity with crossover second toes bilateral.        Assessment & Plan:  Pain limb secondary to diabetes and onychomycosis.  Plan: Diabetes without complications. Hallux valgus and hammertoe deformities and pain limb secondary to onychomycosis. Follow up with him in 3 months.

## 2016-03-16 NOTE — Telephone Encounter (Signed)
Pt called back again to verify that he will be getting the shoes from [place].

## 2016-03-16 NOTE — Telephone Encounter (Signed)
Pt is in need of rx for diabetic [facility]   Call wife with questions

## 2016-03-17 ENCOUNTER — Other Ambulatory Visit: Payer: Self-pay

## 2016-03-17 MED ORDER — GLUCOSE BLOOD VI STRP: ORAL_STRIP | 1 refills | Status: DC

## 2016-03-17 NOTE — Telephone Encounter (Signed)
Sent on 03/17/16

## 2016-03-17 NOTE — Telephone Encounter (Signed)
Pt needs diabetic testing [facility]

## 2016-03-20 ENCOUNTER — Other Ambulatory Visit: Payer: Self-pay | Admitting: Internal Medicine

## 2016-03-23 ENCOUNTER — Other Ambulatory Visit: Payer: Self-pay

## 2016-03-23 ENCOUNTER — Telehealth: Payer: Self-pay | Admitting: Endocrinology

## 2016-03-23 ENCOUNTER — Encounter: Payer: Self-pay | Admitting: Internal Medicine

## 2016-03-23 LAB — HM DIABETES EYE EXAM

## 2016-03-23 MED ORDER — GLUCOSE BLOOD VI STRP: ORAL_STRIP | 1 refills | Status: DC

## 2016-03-23 NOTE — Telephone Encounter (Signed)
Spoke with patient and he is stating that her needs a prescription for diabetic shoes from you- please advise

## 2016-03-23 NOTE — Telephone Encounter (Signed)
Patient is calling on the status of his diabetic testing supply's and diabetic shoes.. Please advise

## 2016-03-23 NOTE — Patient Outreach (Signed)
[facility]) Care Management  03/23/2016  CASYN BECVAR 1946/04/12 [id number]   Telephonic Screening   Referral Date:  03/15/2016 Source:  [place] Issue: Information received from [facility]:  Member & spouse agreeable to [facility] for diabetes management.  Medical h/o includes prostate CA, prior stroke, MI, HTN Insurance:  [employer] Medicare HMO  Subjective:   Outreach call #1 to patient.  Patient reached and completed call.   Providers: Primary MD:  Dr. Billey Gosling  - last appt 03/14/2016 Endocrinologist:  Dr. Elayne Snare  02/03/16 Radiation Oncology:  Dr. Tyler Pita, Dr. Kyung Rudd Podiatrist: Dr. Tyson Dense - last appt 03/17/15  Psycho/Social: Patient lives in his home with his wife.  .  Mobility: Ambulates with no assistive devices. Falls: none  Pain: none  Depression: none  Transportation: yes  Caregiver: wife, Musician: no  Consent:  yes DME: CBG meter, eye glasses   Co-morbidities:  Diabetes, Essential HTN, Chronic Kidney disease, GOUT, Prostrate Cancer   DM  Self checks BS twice a day - ranges from 104-120   BP 196/97 03/16/2016 Weight 200 lb (91 kg) 02/11/2016 Height 66 in (168 cm) 02/11/2016 BMI 32.30 (Obese Class I) 02/11/2016  Lipid Panel completed 09/15/2015 HDL 49.600 09/15/2015 LDL 80.000 09/15/2015 Cholesterol, total 141.000 09/15/2015 Triglycerides 56.000 09/15/2015 A1C 7.400 01/11/2016 Glucose Random 149.000 01/11/2016  Medications:  Patient taking ess than 15 medications  Co-pay cost issues: none  Prevnar (PCV13) 05/04/2015 Pneumovax (PPS 10/02/2012 Flu Vaccine 01/28/2016 tDAP Vaccine 09/21/2011   Encounter Medications:  Outpatient Encounter Prescriptions as of 03/23/2016  Medication Sig Note  . amLODipine (NORVASC) 10 MG tablet TAKE 1 TABLET EVERY DAY   . cloNIDine (CATAPRES) 0.1 MG tablet TAKE 1 TABLET TWICE DAILY   . cloNIDine (CATAPRES) 0.3 MG tablet Take 1 tablet (0.3 mg total) by mouth 2 (two) times daily.   . diazepam  (VALIUM) 10 MG tablet  12/24/2015: Received from: External Pharmacy  . glucose blood (ACCU-CHEK AVIVA PLUS) test strip Use as instructed to check blood sugars 2 times per day dx code E11.29   . isosorbide mononitrate (IMDUR) 60 MG 24 hr tablet TAKE 1 TABLET TWICE DAILY   . losartan (COZAAR) 100 MG tablet TAKE 1 TABLET EVERY DAY   . metFORMIN (GLUCOPHAGE-XR) 750 MG 24 hr tablet Take 1 tablet (750 mg total) by mouth daily with supper.   . metoprolol succinate (TOPROL-XL) 100 MG 24 hr tablet TAKE 1 TABLET TWICE DAILY   . silodosin (RAPAFLO) 4 MG CAPS capsule Take 1 capsule (4 mg total) by mouth at bedtime.    No facility-administered encounter medications on file as of 03/23/2016.     Functional Status:  In your present state of health, do you have any difficulty performing the following activities: 06/16/2015  Hearing? Y  Vision? Y  Difficulty concentrating or making decisions? N  Walking or climbing stairs? N  Dressing or bathing? N  Doing errands, shopping? N  Some recent data might be hidden    Fall/Depression Screening: PHQ 2/9 Scores 08/16/2015  PHQ - 2 Score 0    Fall Risk  03/23/2016 08/16/2015  Falls in the past year? No No    Preventives: Hearing: few years ago; no issues.  Eyes: Dr. Katy Fitch.  Last appt 03/2016; eyeglasses  Dentist: Years ago. Has a few teeth left  Podiatrist: Dr. Tyson Dense - last appt 03/17/15 Colonoscopy:  Unknown  Endo - DM Eye Exam 02/05/2015 Endo - DM Foot Exam 03/16/2016 Colonoscopy 06/22/2015  Plan:  [facility] Referral 03/23/16 -Disease management and education  RN CM advised in next [facility] scheduled contact call within next 30 days for [employer].  RN CM advised to please notify MD of any changes in condition prior to scheduled appt's.   RN CM provided contact name and # [phone] or main office # [phone] and 24-hour nurse line # [phone].  RN CM confirmed patient is aware of 911 services for urgent emergency  needs.  Nathaneil Canary, BSN, RN, [place] Management Care Management Coordinator [phone] Direct [phone] Cell [phone] Office [phone] Fax [email or web address]

## 2016-03-26 NOTE — Telephone Encounter (Signed)
His last foot exam including with the foot Dr. does not qualify for Medicare guidelines for diabetic shoes, cannot send prescription for this.

## 2016-03-27 NOTE — Telephone Encounter (Signed)
Spoke with patients wife and let her know that patient did not qualify for diabetic shoes

## 2016-03-28 ENCOUNTER — Ambulatory Visit
Admission: RE | Admit: 2016-03-28 | Discharge: 2016-03-28 | Disposition: A | Discharge status: Home / Self Care | Payer: Commercial Managed Care - HMO | Source: Ambulatory Visit | Attending: Radiation Oncology | Admitting: Radiation Oncology

## 2016-03-28 ENCOUNTER — Encounter: Payer: Self-pay | Admitting: Radiation Oncology

## 2016-03-28 VITALS — BP 154/86 | HR 52 | Temp 97.7°F | Resp 18 | Ht 66.0 in | Wt 200.6 lb

## 2016-03-28 DIAGNOSIS — R351 Nocturia: Secondary | ICD-10-CM | POA: Diagnosis not present

## 2016-03-28 DIAGNOSIS — C61 Malignant neoplasm of prostate: Secondary | ICD-10-CM | POA: Diagnosis not present

## 2016-03-28 DIAGNOSIS — R001 Bradycardia, unspecified: Secondary | ICD-10-CM | POA: Diagnosis not present

## 2016-03-28 NOTE — Progress Notes (Signed)
Here for follow up appointment.  Weight stable. Heart rate continues to be low at 52. Reports nocturia x- 3. Denies dysuria or hematuria and denies intermittent urinary frequency. Reports on occasional strong urge to void but, only empties a little urine. Denies leakage or incontinence and urgency. Denies diarrhea , having daily normal bowel movements not using anusol suppository or having any blood in his stool.  Denies  fatigue. Wt Readings from Last 3 Encounters:  03/28/16 200 lb 9.6 oz (91 kg)  02/11/16 199 lb 12.8 oz (90.6 kg)  02/04/16 201 lb (91.2 kg)  BP (!) 154/86   Pulse (!) 52   Temp 97.7 F (36.5 C) (Oral)   Resp 18   Ht 5\' 6"  (1.676 m)   Wt 200 lb 9.6 oz (91 kg)   SpO2 100%   BMI 32.38 kg/m

## 2016-03-28 NOTE — Progress Notes (Signed)
Radiation Oncology         [phone] ________________________________  Name: Corey [name] MRN: [id number]  Date: 03/28/2016  DOB: 1946-04-08  Post Treatment Note  CC: [name], MD  Carolan Clines, MD  Diagnosis:   70 y.o.gentleman with stage T1c adenocarcinoma of the prostate with a Gleason's score of 4+3 and a PSA of 4.48     Interval Since Last Radiation:  6  weeks   12/20/2015 to 02/15/2016: The prostate was treated to 78 Gy in 40 fractions of 1.95 Gy  Narrative:  The patient returns today for routine follow-up. He tolerated radiotherapy but does have a history of lower urinary tract symptoms. He has a gland size of 35 cc prior to his treatment, but has continued to need rapaflo for incomplete emptying and nocturia. He also had some proctitis from radiotherapy and used anusol suppositories to help with this. He has tried to avoid drinking two hours prior to sleep without significant changes in his symptoms.                          On review of systems, the patient states he is still having trouble with nocturia, and is hopeful to get more rapaflo samples from [facility]. He does not have a scheduled appointment, but our scheduler is trying to get him a follow up visit in the next 1-2 months for post treatment surveillance. He denies any difficulty with diarrhea, or rectal pain. He is no longer using anusol suppositories. He denies any chest pain or fatigue issues. No other complaints are noted.  ALLERGIES:  is allergic to hydrochlorothiazide and allopurinol.  Meds: Current Outpatient Prescriptions  Medication Sig Dispense Refill  . amLODipine (NORVASC) 10 MG tablet TAKE 1 TABLET EVERY DAY 90 tablet 1  . cloNIDine (CATAPRES) 0.1 MG tablet TAKE 1 TABLET TWICE DAILY 180 tablet 1  . cloNIDine (CATAPRES) 0.3 MG tablet Take 1 tablet (0.3 mg total) by mouth 2 (two) times daily. 60 tablet 5  . diazepam (VALIUM) 10 MG tablet     . glucose blood (ACCU-CHEK AVIVA PLUS) test  strip Use as instructed to check blood sugars 2 times per day dx code E11.29 200 each 1  . isosorbide mononitrate (IMDUR) 60 MG 24 hr tablet TAKE 1 TABLET TWICE DAILY 180 tablet 1  . losartan (COZAAR) 100 MG tablet TAKE 1 TABLET EVERY DAY 90 tablet 1  . metFORMIN (GLUCOPHAGE-XR) 750 MG 24 hr tablet Take 1 tablet (750 mg total) by mouth daily with supper. 30 tablet 1  . metoprolol succinate (TOPROL-XL) 100 MG 24 hr tablet TAKE 1 TABLET TWICE DAILY 180 tablet 1  . silodosin (RAPAFLO) 4 MG CAPS capsule Take 1 capsule (4 mg total) by mouth at bedtime. 30 capsule 6   No current facility-administered medications for this encounter.     Physical Findings:  height is 5\' 6"  (1.676 m) and weight is 200 lb 9.6 oz (91 kg). His oral temperature is 97.7 F (36.5 C). His blood pressure is 154/86 (abnormal) and his pulse is 52 (abnormal). His respiration is 18 and oxygen saturation is 100%.  In general this is a well appearing African American male in no acute distress. He's alert and oriented x4 and appropriate throughout the examination. Cardiopulmonary assessment is negative for acute distress and he exhibits normal effort.   Lab Findings: Lab Results  Component Value Date   WBC 4.4 06/16/2015   HGB 12.9 (L) 06/16/2015  HCT 40.0 06/16/2015   MCV 80.2 06/16/2015   PLT 197.0 06/16/2015     Radiographic Findings: Dg Foot Complete Left  Result Date: 03/25/2016 See detailed radiograph report in office note   Dg Foot Complete Right  Result Date: 03/25/2016 See detailed radiograph report in office note    Impression/Plan: 1. 70 y.o.gentleman with stage T1c adenocarcinoma of the prostate with a Gleason's score of 4+3 and a PSA of 4.48. The patient appears to be doing well since radiotherapy. We will get an appointment for him with Dr. Gaynelle Arabian for follow up, and we'd be happy to see him for follow up as needed. 2. Bradycardia. The patient will follow up with his PCP for this, and seek urgent  medical attention if he has acute symptoms of chest pain or shortness of breath. 3. Nocturia. I will ask our nurse navigator to contact the patient if she is able to help obtain samples of rapaflo if Dr. Gaynelle Arabian agrees with this.        Carola Rhine, PAC

## 2016-03-29 ENCOUNTER — Encounter: Payer: Self-pay | Admitting: Medical Oncology

## 2016-03-31 ENCOUNTER — Telehealth: Payer: Self-pay | Admitting: Medical Oncology

## 2016-03-31 NOTE — Progress Notes (Signed)
Left message that [facility] has sampled of Rapaflo and he can pick up this afternoon at the front desk.

## 2016-03-31 NOTE — Progress Notes (Signed)
[facility] to ask for samples of Rapaflo for patient. They will leave samples at the front desk for Corey [name].

## 2016-03-31 NOTE — Progress Notes (Signed)
Alison-PA for Dr. Tammi Klippel called stating that Corey [name] contacted her because he is out of his 55 and does not have money to get refilled.

## 2016-04-04 ENCOUNTER — Encounter: Payer: Self-pay | Admitting: *Deleted

## 2016-04-04 ENCOUNTER — Other Ambulatory Visit: Payer: Self-pay | Admitting: *Deleted

## 2016-04-04 NOTE — Patient Outreach (Signed)
[place] [facility]) Care Management  04/04/2016  Corey [name] 1946-01-10 [id number]    RN Health Coach  Attempted initial telephone outreach call to patient.  Patient was unavailable. Phone line rang busy. Unable to leave voicemail message. Plan: RN will call patient again within 14 days.   [place] Care Management [phone]

## 2016-04-17 ENCOUNTER — Telehealth: Payer: Self-pay | Admitting: Medical Oncology

## 2016-04-17 NOTE — Telephone Encounter (Signed)
Mr. Palau called to inform me he just got my message about Rapaflo samples. His phone was out of order. I informed him that the samples are at the front desk at [facility]. He voiced understanding.

## 2016-04-18 ENCOUNTER — Other Ambulatory Visit: Payer: Self-pay | Admitting: *Deleted

## 2016-04-18 NOTE — Patient Outreach (Signed)
[facility]) Care Management  04/18/2016  KAYSIN BROCK 01-21-1946 [id number]  RN Health Coach telephone call to patient.  Hipaa compliance verified. RN Health Coach left message with wife [name] for return call from patient with call back number. Plan: If patient has not returned call. RN will call again within 14 days.  [place] Care Management [phone]

## 2016-04-26 ENCOUNTER — Other Ambulatory Visit: Payer: Self-pay | Admitting: *Deleted

## 2016-04-27 ENCOUNTER — Encounter: Payer: Self-pay | Admitting: *Deleted

## 2016-04-28 NOTE — Patient Outreach (Signed)
Corey [name]) Care Management  04/27/2016   Corey [name] [birth year] [id number]  Subjective: RN Health Coach telephone call to patient.  Hipaa compliance verified. Per patient he has been a diabetic for around 23 years. Patient checks his blood sugar 2-3 times  Day. Per patient when his blood sugar drops he drinks water of orange juice.Per patient when he feels his blood sugar is high he does nothing. Per patient he is not on a special diet. He eats whatever has been cooked. Per patient he does check his feet.  Patient does not know what the A1C means. Patient does not have an advanced directive. Patient has agreed to follow up outreach calls.   Objective:   Current Medications:  Current Outpatient Prescriptions  Medication Sig Dispense Refill  . amLODipine (NORVASC) 10 MG tablet TAKE 1 TABLET EVERY DAY 90 tablet 1  . cloNIDine (CATAPRES) 0.1 MG tablet TAKE 1 TABLET TWICE DAILY 180 tablet 1  . cloNIDine (CATAPRES) 0.3 MG tablet Take 1 tablet (0.3 mg total) by mouth 2 (two) times daily. 60 tablet 5  . diazepam (VALIUM) 10 MG tablet     . glucose blood (ACCU-CHEK AVIVA PLUS) test strip Use as instructed to check blood sugars 2 times per day dx code E11.29 200 each 1  . isosorbide mononitrate (IMDUR) 60 MG 24 hr tablet TAKE 1 TABLET TWICE DAILY 180 tablet 1  . losartan (COZAAR) 100 MG tablet TAKE 1 TABLET EVERY DAY 90 tablet 1  . metFORMIN (GLUCOPHAGE-XR) 750 MG 24 hr tablet Take 1 tablet (750 mg total) by mouth daily with supper. 30 tablet 1  . metoprolol succinate (TOPROL-XL) 100 MG 24 hr tablet TAKE 1 TABLET TWICE DAILY 180 tablet 1  . silodosin (RAPAFLO) 4 MG CAPS capsule Take 1 capsule (4 mg total) by mouth at bedtime. 30 capsule 6   No current facility-administered medications for this visit.     Functional Status:  In your present state of health, do you have any difficulty performing the following activities: 04/27/2016 03/23/2016  Hearing? N N  Vision? N N   Difficulty concentrating or making decisions? N N  Walking or climbing stairs? N N  Dressing or bathing? N N  Doing errands, shopping? N N  Preparing Food and eating ? N N  Using the Toilet? N N  In the past six months, have you accidently leaked urine? N N  Do you have problems with loss of bowel control? N N  Managing your Medications? N N  Managing your Finances? N N  Housekeeping or managing your Housekeeping? N N  Some recent data might be hidden    Fall/Depression Screening: PHQ 2/9 Scores 04/27/2016 03/28/2016 03/23/2016 08/16/2015  PHQ - 2 Score 0 0 0 0   THN CM Care Plan Problem One   Flowsheet Row Most Recent Value  Care Plan Problem One  (P) [place] Deficit in Self Management of Diabetes  Role Documenting the Problem One  (P) Health [place] for Problem One  (P) Active  THN Long Term Goal (31-90 days)  (P) Patient will have a decrease in A1C within the next 90 days  THN Long Term Goal Start Date  (P) 04/27/16  Interventions for Problem One Long Term Goal  (P) RN sent patient EMMI educatioanl materal on what the A1c is. RN sent educational material on know your goals and how the A1C tests help. RN will follow up with discussion and teach back  THN CM  Short Term Goal #1 (0-30 days)  (P) Patient will verbalize understanding the signs and symptoms of hypo and hyperglycemia is within the next 30 days  THN CM Short Term Goal #1 Start Date  (P) 04/27/16  Interventions for Short Term Goal #1  (P) RN sent a picture chart of faces of hypo and hyperglycemia. RN sent EMMI educational material. RN will follow up with discussion and teach back on hyper and hypoglycemia  THN CM Short Term Goal #2 (0-30 days)  (P) Patient will verbalize how to treat hypo and hyperglycemia correctly within the next 30 days  THN CM Short Term Goal #2 Start Date  (P) 04/27/16  Interventions for Short Term Goal #2  (P) RN sent educational material on Treating high and low blood glucose  THN CM Short  Term Goal #3 (0-30 days)  (P) Patient will verbalize receiving information on advance directive  THN CM Short Term Goal #3 Start Date  (P) 04/27/16  Interventions for Short Tern Goal #3  (P) RN discussed with patient why advance directives are needed. RN sent patient an advance directive packet. RN will follow up with patient      Assessment:  Patient is not on a special diet Patient does not have an advance directive Patient does not know the signs and symptoms of hypo and hyperglycemia Patient does not know the treatment of hyper and hypoglycemia Patient does not know what the A1C means  Plan:  RN sent  An advance directive packet Rn sent picture faces of hypo and hyperglycemia RN sent EMMI educational material on hypo and hyperglycemia RN sent educational material on How the A1C helps RN will follow up in the month of January  [place] Management [phone]

## 2016-05-01 DIAGNOSIS — C61 Malignant neoplasm of prostate: Secondary | ICD-10-CM | POA: Diagnosis not present

## 2016-05-02 ENCOUNTER — Other Ambulatory Visit: Payer: Commercial Managed Care - HMO

## 2016-05-05 ENCOUNTER — Encounter: Payer: Self-pay | Admitting: *Deleted

## 2016-05-05 ENCOUNTER — Ambulatory Visit: Payer: Commercial Managed Care - HMO | Admitting: Endocrinology

## 2016-05-09 DIAGNOSIS — C61 Malignant neoplasm of prostate: Secondary | ICD-10-CM | POA: Diagnosis not present

## 2016-05-09 DIAGNOSIS — N320 Bladder-neck obstruction: Secondary | ICD-10-CM | POA: Diagnosis not present

## 2016-05-19 ENCOUNTER — Other Ambulatory Visit: Payer: Self-pay | Admitting: *Deleted

## 2016-05-19 NOTE — Patient Outreach (Signed)
[place] [facility]) Care Management  05/19/2016  Corey [name] [facility] November 03, 1945 [id number]   RN Health Coach attempted #1 Follow up outreach call to patient.  Patient was unavailable. HIPPA compliance message was left with [name] and  return callback number.  Plan: RN will call patient again within 14 days.   [place] Care Management [phone]

## 2016-05-31 ENCOUNTER — Ambulatory Visit: Payer: Self-pay | Admitting: *Deleted

## 2016-05-31 ENCOUNTER — Ambulatory Visit: Payer: Commercial Managed Care - HMO | Admitting: Endocrinology

## 2016-05-31 ENCOUNTER — Encounter: Payer: Self-pay | Admitting: *Deleted

## 2016-05-31 ENCOUNTER — Other Ambulatory Visit: Payer: Self-pay | Admitting: *Deleted

## 2016-05-31 NOTE — Patient Outreach (Signed)
[place] [facility]) Care Management  05/31/2016   Corey [name] [birth year] [id number]   RN Health Coach telephone call to patient.  Hipaa compliance verified. Per patient his fasting blood sugar is 122. Patient stated it has been 200 but noted patient stated he had just ate some cake. Per patient he hasn't had any symptoms of high and low blood sugars since last conversation. Per patient stated he doesn't recall receiving the information packet RN Health Coach had sent. Patient stated sometimes he has transportation problems getting to Dr visits. RN discussed with Patient about [employer] transportation. Patient refused referring to social worker and stated he would check into it. Patient has agreed to follow up outreach calls.   Objective:   Current Medications:  Current Outpatient Prescriptions  Medication Sig Dispense Refill  . amLODipine (NORVASC) 10 MG tablet TAKE 1 TABLET EVERY DAY 90 tablet 1  . cloNIDine (CATAPRES) 0.1 MG tablet TAKE 1 TABLET TWICE DAILY 180 tablet 1  . cloNIDine (CATAPRES) 0.3 MG tablet Take 1 tablet (0.3 mg total) by mouth 2 (two) times daily. 60 tablet 5  . diazepam (VALIUM) 10 MG tablet     . glucose blood (ACCU-CHEK AVIVA PLUS) test strip Use as instructed to check blood sugars 2 times per day dx code E11.29 200 each 1  . isosorbide mononitrate (IMDUR) 60 MG 24 hr tablet TAKE 1 TABLET TWICE DAILY 180 tablet 1  . losartan (COZAAR) 100 MG tablet TAKE 1 TABLET EVERY DAY 90 tablet 1  . metFORMIN (GLUCOPHAGE-XR) 750 MG 24 hr tablet Take 1 tablet (750 mg total) by mouth daily with supper. 30 tablet 1  . metoprolol succinate (TOPROL-XL) 100 MG 24 hr tablet TAKE 1 TABLET TWICE DAILY 180 tablet 1  . silodosin (RAPAFLO) 4 MG CAPS capsule Take 1 capsule (4 mg total) by mouth at bedtime. 30 capsule 6   No current facility-administered medications for this visit.     Functional Status:  In your present state of health, do you have any difficulty performing the  following activities: 04/27/2016 03/23/2016  Hearing? N N  Vision? N N  Difficulty concentrating or making decisions? N N  Walking or climbing stairs? N N  Dressing or bathing? N N  Doing errands, shopping? N N  Preparing Food and eating ? N N  Using the Toilet? N N  In the past six months, have you accidently leaked urine? N N  Do you have problems with loss of bowel control? N N  Managing your Medications? N N  Managing your Finances? N N  Housekeeping or managing your Housekeeping? N N  Some recent data might be hidden    Fall/Depression Screening: PHQ 2/9 Scores 05/31/2016 04/27/2016 03/28/2016 03/23/2016 08/16/2015  PHQ - 2 Score 0 0 0 0 0   THN CM Care Plan Problem One   Flowsheet Row Most Recent Value  Care Plan Problem One  knowledge Deficit in self management of Diabetes  Role Documenting the Problem One  [place] for Problem One  Active  THN Long Term Goal (31-90 days)  Patient will have a decrease in A1C within the next 90 days  THN Long Term Goal Start Date  04/27/16  Interventions for Problem One Long Term Goal  RN sent patient EMMI educational materal on what the A1C is. Secretary/administrator on know your goals and how the A1C tests help. RN will follow up with discussion and teach back  [facility] CM Short  Term Goal #1 (0-30 days)  Patient will verbalize understanding the signs and symptoms of hypo and hyperglycemia is within the next 30 days  THN CM Short Term Goal #1 Start Date  04/27/16  Interventions for Short Term Goal #1  RN sent a picture chart showing faces of hypo and hyperglycemia symptoms. RN sent EMMI educational material. RN will follow up with discussion and teach back on hyper and hypoglycemia  THN CM Short Term Goal #2 (0-30 days)  Patient will verbalize how to treat hypo and hyperglycemia correctly within the next 30 days  THN CM Short Term Goal #2 Start Date  04/27/16  Interventions for Short Term Goal #2  RN sent educational material on  Treating high and low blood glucose  THN CM Short Term Goal #3 (0-30 days)  Patient will verbalize receiving information on advance directive  THN CM Short Term Goal #3 Start Date  04/27/16  Interventions for Short Tern Goal #3  RN discussed with patient why advance directives are needed. RN sent patient an advance directive packet. RN will follow up with patient       Assessment:  Per patient he did not receive the educational information Patient does have transportation needs Patient refused social worker referral Patient will continue to benefit from [employer] telephonic outreach for education and support for diabetes self management.  Plan: RN will resend  An advance directive packet Rn will resend  picture faces of hypo and hyperglycemia RN will resend EMMI educational material on hypo and hyperglycemia RN will resend Neurosurgeon on How the A1C helps [place] discussed [place] transportation services RN will follow up within the month of February  [place] Management [phone]

## 2016-06-14 ENCOUNTER — Ambulatory Visit: Payer: Commercial Managed Care - HMO | Admitting: Endocrinology

## 2016-06-14 ENCOUNTER — Other Ambulatory Visit: Payer: Self-pay | Admitting: Emergency Medicine

## 2016-06-14 NOTE — Telephone Encounter (Signed)
I do not think he is on that anymore - he is following with urology and the refill should be deferred to them.

## 2016-06-14 NOTE — Telephone Encounter (Signed)
Received refill for Tamsulosin, not on current med list, please advise.

## 2016-06-15 ENCOUNTER — Ambulatory Visit: Payer: Commercial Managed Care - HMO | Admitting: Podiatry

## 2016-06-15 MED ORDER — AMLODIPINE BESYLATE 10 MG PO TABS: 10.0000 mg | ORAL_TABLET | Freq: Every day | ORAL | 0 refills | Status: DC

## 2016-06-20 ENCOUNTER — Ambulatory Visit: Payer: Medicare PPO | Admitting: Podiatry

## 2016-06-30 ENCOUNTER — Ambulatory Visit: Payer: Self-pay | Admitting: *Deleted

## 2016-07-03 ENCOUNTER — Other Ambulatory Visit: Payer: Self-pay

## 2016-07-03 ENCOUNTER — Other Ambulatory Visit: Payer: Self-pay | Admitting: Internal Medicine

## 2016-07-03 MED ORDER — ACCU-CHEK SOFTCLIX LANCET DEV MISC: 3 refills | Status: DC

## 2016-07-03 MED ORDER — METFORMIN HCL ER 750 MG PO TB24: 750.0000 mg | ORAL_TABLET | Freq: Every day | ORAL | 1 refills | Status: DC

## 2016-07-03 MED ORDER — GLUCOSE BLOOD VI STRP: ORAL_STRIP | 2 refills | Status: DC

## 2016-07-05 ENCOUNTER — Other Ambulatory Visit: Payer: Self-pay

## 2016-07-10 ENCOUNTER — Other Ambulatory Visit: Payer: Self-pay

## 2016-07-12 ENCOUNTER — Other Ambulatory Visit: Payer: Self-pay | Admitting: *Deleted

## 2016-07-12 ENCOUNTER — Encounter: Payer: Self-pay | Admitting: *Deleted

## 2016-07-12 DIAGNOSIS — E0800 Diabetes mellitus due to underlying condition with hyperosmolarity without nonketotic hyperglycemic-hyperosmolar coma (NKHHC): Secondary | ICD-10-CM

## 2016-07-12 NOTE — Patient Outreach (Signed)
[facility] [facility]) Care Management  07/12/2016   Corey [name] 1946/02/07 [id number]  RN Health Coach telephone call to patient.  Hipaa compliance verified. Per patient his blood sugar this morning was 104 fasting. Patient stated no hypo or hyperglycemic reactions since last outreach call.  Patient stated he went to Dr Katy Fitch and had an eye exam. Per patient his vision is foggy. He was given a prescription for new eye glasses but is unable to fill.Per patient he can't afford them right now.  Per patient he is unable to follow his diet because, he unable to afford the foods. Per patient he can afford his medications. Patient stated that he has a foot appointment with the foot Dr but he does have transportation problems at time. Patient has agreed to follow up outreach calls.     Current Medications:  Current Outpatient Prescriptions  Medication Sig Dispense Refill  . amLODipine (NORVASC) 10 MG tablet Take 1 tablet (10 mg total) by mouth daily. --- Office visit needed for further refills 90 tablet 0  . cloNIDine (CATAPRES) 0.1 MG tablet TAKE 1 TABLET TWICE DAILY 180 tablet 1  . cloNIDine (CATAPRES) 0.3 MG tablet Take 1 tablet (0.3 mg total) by mouth 2 (two) times daily. 60 tablet 5  . diazepam (VALIUM) 10 MG tablet     . glucose blood (ACCU-CHEK AVIVA PLUS) test strip Use as instructed to check blood sugars 2 times per day dx code E11.29 200 each 2  . isosorbide mononitrate (IMDUR) 60 MG 24 hr tablet TAKE 1 TABLET TWICE DAILY. 180 tablet 0  . Lancet Devices (ACCU-CHEK SOFTCLIX) lancets Use to test blood sugar 2 times daily- Dx code E11.29 1 each 3  . losartan (COZAAR) 100 MG tablet TAKE 1 TABLET EVERY DAY 90 tablet 1  . metFORMIN (GLUCOPHAGE-XR) 750 MG 24 hr tablet Take 1 tablet (750 mg total) by mouth daily with supper. 30 tablet 1  . metoprolol succinate (TOPROL-XL) 100 MG 24 hr tablet TAKE 1 TABLET TWICE DAILY 180 tablet 1  . silodosin (RAPAFLO) 4 MG CAPS capsule Take 1 capsule (4  mg total) by mouth at bedtime. 30 capsule 6   No current facility-administered medications for this visit.     Functional Status:  In your present state of health, do you have any difficulty performing the following activities: 07/12/2016 04/27/2016  Hearing? N N  Vision? Y N  Difficulty concentrating or making decisions? N N  Walking or climbing stairs? N N  Dressing or bathing? N N  Doing errands, shopping? N N  Preparing Food and eating ? N N  Using the Toilet? N N  In the past six months, have you accidently leaked urine? N N  Do you have problems with loss of bowel control? N N  Managing your Medications? N N  Managing your Finances? N N  Housekeeping or managing your Housekeeping? N N  Some recent data might be hidden    Fall/Depression Screening: PHQ 2/9 Scores 07/12/2016 05/31/2016 04/27/2016 03/28/2016 03/23/2016 08/16/2015  PHQ - 2 Score 0 0 0 0 0 0   THN CM Care Plan Problem One   Flowsheet Row Most Recent Value  Care Plan Problem One  knowledge Deficit in self management of Diabetes  Role Documenting the Problem One  [place] for Problem One  Active  THN Long Term Goal (31-90 days)  Patient will have a decrease in A1C within the next 90 days  Interventions for Problem One  Long Term Goal  RN sent patient EMMI educational materal on what the A1C is. RN sent educational material on know your goals and how the A1C tests help. RN will follow up with discussion and teach back  THN CM Short Term Goal #1 (0-30 days)  Patient will verbalize understanding the signs and symptoms of hypo and hyperglycemia is within the next 30 days  THN CM Short Term Goal #1 Start Date  07/12/16  Interventions for Short Term Goal #1  RN sent a picture chart showing faces of hypo and hyperglycemia symptoms. RN sent EMMI educational material. RN will follow up with discussion and teach back on hyper and hypoglycemia. RN sent living well with diabetes  THN CM Short Term Goal #2 (0-30 days)   Patient will verbalize how to treat hypo and hyperglycemia correctly within the next 30 days  THN CM Short Term Goal #2 Start Date  07/12/16  Interventions for Short Term Goal #2  RN sent educational material on Treating high and low blood glucose  THN CM Short Term Goal #3 (0-30 days)  Patient will verbalize receiving information on advance directive  THN CM Short Term Goal #3 Met Date  07/12/16      Assessment:  Patient has transportation needs Patient unable to fill prescription  For glasses Patient needs assistance with applying for meals on wheels Patient will continue to benefit from [place] telephonic outreach for education and support for diabetes self management.  Plan:  Referred to social worker RN sent Diabetes educational booklet living well with diabetes RN discussed medication adherence RN discussed hypo and hyperglycemia RN discussed patient diet  THN CM Care Plan Problem One   Flowsheet Row Most Recent Value  Care Plan Problem One  knowledge Deficit in self management of Diabetes  Role Documenting the Problem One  [place] for Problem One  Active  THN Long Term Goal (31-90 days)  Patient will have a decrease in A1C within the next 90 days  Interventions for Problem One Long Term Goal  RN sent patient EMMI educational materal on what the A1C is. RN sent educational material on know your goals and how the A1C tests help. RN will follow up with discussion and teach back  THN CM Short Term Goal #1 (0-30 days)  Patient will verbalize understanding the signs and symptoms of hypo and hyperglycemia is within the next 30 days  THN CM Short Term Goal #1 Start Date  07/12/16  Interventions for Short Term Goal #1  RN sent a picture chart showing faces of hypo and hyperglycemia symptoms. RN sent EMMI educational material. RN will follow up with discussion and teach back on hyper and hypoglycemia. RN sent living well with diabetes  THN CM Short Term Goal #2 (0-30  days)  Patient will verbalize how to treat hypo and hyperglycemia correctly within the next 30 days  THN CM Short Term Goal #2 Start Date  07/12/16  Interventions for Short Term Goal #2  RN sent educational material on Treating high and low blood glucose  THN CM Short Term Goal #3 (0-30 days)  Patient will verbalize receiving information on advance directive  [facility] CM Short Term Goal #3 Met Date  07/12/16    ,  [place] Care Management [phone]

## 2016-07-13 ENCOUNTER — Encounter: Payer: Self-pay | Admitting: *Deleted

## 2016-07-17 ENCOUNTER — Other Ambulatory Visit: Payer: Self-pay | Admitting: *Deleted

## 2016-07-17 NOTE — Patient Outreach (Signed)
[place] [facility]) Care Management  07/17/2016  Corey [name] [birth year] [id number]   CSW made an initial attempt to try and contact patient today to perform phone assessment, as well as assess and assist with social needs and services, without success.  A HIPAA complaint message was left for patient on voicemail.  CSW is currently awaiting a return call.  CSW will make a second outreach attempt in one week, if CSW does not receive a return call from patient in the meantime. Nat Christen, BSW, MSW, LCSW  Licensed Education officer, environmental Health System  Mailing [place] [address], [place], [place] [address] Physical Address-[address], [place], [place] [address] Toll Free Main # [phone] Fax # [phone] Cell # [phone]  Office # [phone] [email or web address]

## 2016-07-20 ENCOUNTER — Telehealth: Payer: Self-pay | Admitting: *Deleted

## 2016-07-20 NOTE — Telephone Encounter (Signed)
OK only a few minutes in appointment for labs and follow-up, is overdue

## 2016-07-20 NOTE — Telephone Encounter (Signed)
Rec'd fax pt requesting 90 day script on his metformin. Pt see Dr. Dwyane Dee for his diabetes forwarding msg to endo.Marland KitchenJohny [name]

## 2016-07-24 ENCOUNTER — Other Ambulatory Visit: Payer: Self-pay | Admitting: *Deleted

## 2016-07-24 NOTE — Patient Outreach (Addendum)
[place] [facility]) Care Management  07/24/2016  RHET RORKE 07/26/1945 [id number]   CSW made a second attempt to try and contact patient today to perform phone assessment, as well as assess and assist with social needs and services, without success.  A HIPAA complaint message was left for patient on voicemail.  CSW is currently awaiting a return call.  CSW will make a third and final outreach attempt in one week, if CSW does not receive a return call from patient in the meantime. Nat Christen, BSW, MSW, LCSW  Licensed Education officer, environmental Health System  Mailing [place] [address] [place], [place] [address] Physical Address-[address], [place], [place] [address] Toll Free Main # [phone] Fax # [phone] Cell # [phone]  Office # [phone] [email or web address]

## 2016-07-25 ENCOUNTER — Ambulatory Visit: Payer: Medicare PPO | Admitting: Podiatry

## 2016-07-26 ENCOUNTER — Other Ambulatory Visit: Payer: Self-pay

## 2016-07-26 MED ORDER — METFORMIN HCL ER 750 MG PO TB24: 750.0000 mg | ORAL_TABLET | Freq: Every day | ORAL | 1 refills | Status: DC

## 2016-07-30 NOTE — Telephone Encounter (Signed)
Correction: He needs to first make an appointment for follow-up for his diabetes and labs before visit

## 2016-07-31 ENCOUNTER — Other Ambulatory Visit: Payer: Self-pay | Admitting: *Deleted

## 2016-07-31 ENCOUNTER — Encounter: Payer: Self-pay | Admitting: *Deleted

## 2016-07-31 NOTE — Telephone Encounter (Signed)
This has been done.

## 2016-07-31 NOTE — Patient Outreach (Signed)
[place] [facility]) Care Management  07/31/2016  Corey [name] [birth year] [id number]   CSW made a third and final attempt to try and contact patient today to perform phone assessment, as well as assess and assist with social work needs and services, without success.  A HIPAA compliant message was left for patient on voicemail.  CSW  continues to await a return call.  CSW will mail an outreach letter to patient's home, encouraging patient to contact CSW at their earliest convenience, if patient is interested in receiving social work services through [place] with [facility].  If CSW does not receive a return call from patient within the next 10 business days, CSW will proceed with case closure.  Required number of phone attempts will have been made and outreach letter mailed.   Nat Christen, BSW, MSW, LCSW  Licensed Education officer, environmental Health System  Mailing [place] [name] [place], [place] [address] Physical Address-[address], [place], [place] [address] Toll Free Main # [phone] Fax # [phone] Cell # [phone]  Office # [phone] [email or web address]

## 2016-08-04 ENCOUNTER — Other Ambulatory Visit (INDEPENDENT_AMBULATORY_CARE_PROVIDER_SITE_OTHER): Payer: Medicare PPO

## 2016-08-04 DIAGNOSIS — Z794 Long term (current) use of insulin: Secondary | ICD-10-CM

## 2016-08-04 DIAGNOSIS — E1165 Type 2 diabetes mellitus with hyperglycemia: Secondary | ICD-10-CM | POA: Diagnosis not present

## 2016-08-04 LAB — BASIC METABOLIC PANEL
BUN: 27 mg/dL — AB (ref 6–23)
CHLORIDE: 110 meq/L (ref 96–112)
CO2: 25 meq/L (ref 19–32)
CREATININE: 1.41 mg/dL (ref 0.40–1.50)
Calcium: 10.3 mg/dL (ref 8.4–10.5)
GFR: 64.23 mL/min (ref >60.00)
Glucose, Bld: 143 mg/dL — ABNORMAL HIGH (ref 70–99)
Potassium: 4.1 mEq/L (ref 3.5–5.1)
Sodium: 141 mEq/L (ref 135–145)

## 2016-08-05 LAB — FRUCTOSAMINE: Fructosamine: 394 umol/L — ABNORMAL HIGH (ref 0–285)

## 2016-08-08 ENCOUNTER — Ambulatory Visit (INDEPENDENT_AMBULATORY_CARE_PROVIDER_SITE_OTHER): Payer: Medicare PPO | Admitting: Podiatry

## 2016-08-08 ENCOUNTER — Encounter: Payer: Self-pay | Admitting: Podiatry

## 2016-08-08 DIAGNOSIS — B351 Tinea unguium: Secondary | ICD-10-CM | POA: Diagnosis not present

## 2016-08-08 DIAGNOSIS — E119 Type 2 diabetes mellitus without complications: Secondary | ICD-10-CM

## 2016-08-08 DIAGNOSIS — M79676 Pain in unspecified toe(s): Secondary | ICD-10-CM

## 2016-08-09 ENCOUNTER — Ambulatory Visit: Payer: Self-pay | Admitting: *Deleted

## 2016-08-09 ENCOUNTER — Encounter: Payer: Self-pay | Admitting: Endocrinology

## 2016-08-09 ENCOUNTER — Ambulatory Visit (INDEPENDENT_AMBULATORY_CARE_PROVIDER_SITE_OTHER): Payer: Medicare PPO | Admitting: Endocrinology

## 2016-08-09 VITALS — BP 124/72 | HR 50 | Ht 66.0 in | Wt 196.0 lb

## 2016-08-09 DIAGNOSIS — E1165 Type 2 diabetes mellitus with hyperglycemia: Secondary | ICD-10-CM

## 2016-08-09 DIAGNOSIS — Z794 Long term (current) use of insulin: Secondary | ICD-10-CM

## 2016-08-09 LAB — MICROALBUMIN / CREATININE URINE RATIO
CREATININE, U: 183.2 mg/dL
MICROALB/CREAT RATIO: 1.9 mg/g (ref 0.0–30.0)
Microalb, Ur: 3.5 mg/dL — ABNORMAL HIGH (ref 0.0–1.9)

## 2016-08-09 LAB — POCT GLYCOSYLATED HEMOGLOBIN (HGB A1C): Hemoglobin A1C: 8

## 2016-08-09 MED ORDER — GLIMEPIRIDE 1 MG PO TABS: 1.0000 mg | ORAL_TABLET | Freq: Every day | ORAL | 0 refills | Status: DC

## 2016-08-09 NOTE — Progress Notes (Signed)
Patient ID: Corey [name], male   DOB: [birth year], 71 y.o.   MRN: [id number]   Reason for Appointment : Followup of Type 2 Diabetes   History of Present Illness          Diagnosis: Type 2 diabetes mellitus, date of diagnosis: 1997      Past history: The patient was initially treated with various oral hypoglycemic drugs and details are not available. About 2009 he was started on insulin and has either taken premixed insulin or Lantus once a day He had difficulty affording his Lantus insulin and prefers to take premixed insulin  Recent history:    INSULIN regimen is described as:  none  He has not been seen in follow-up for several months again  His A1c is now 8%, previously 7.4  Current blood sugar patterns and problems:  He could not afford his insulin and has been only on metformin, the dose was reduced because of mild renal dysfunction  He has checked her sugars almost twice a day but the date on his monitor is not programmed properly  Although he thinks he is doing well with his diet he still has sporadically high readings after supper  Trying to walk about 3 times a week weather permitting His treatment is limited by out-of-pocket expense, previously had done well with Victoza and also needed insulin at some point  Hypoglycemia : none    Glucose monitoring:  done about 1 time a day         Glucometer:  Accu-Chek      Blood Glucose readings by monitor download: Range 133-171 with only 4 readings averaging 151   Mean values apply above for all meters except median for One Touch  PRE-MEAL Fasting Lunch Dinner Bedtime Overall  Glucose range: 137-151    137-224    Mean/median:         Self-care:       Meals: 2-3 meals per day. Egg in am at 9-10 am or grits , rarely sandwich at lunch. His largest meal is at suppertime at about 8 pm.  Avoiding regular soft drinks  Physical activity: exercise: Walking 30-40 minutes; 2-3/7  days       Dietician visit: Most recent:  02/2014               Oral hypoglycemic drugs the patient is taking are:  Metformin        Compliance with the medical regimen:  Fair.    Wt Readings from Last 3 Encounters:  08/09/16 196 lb (88.9 kg)  03/28/16 200 lb 9.6 oz (91 kg)  02/11/16 199 lb 12.8 oz (90.6 kg)    Lab Results  Component Value Date   HGBA1C 8.0 08/09/2016   HGBA1C 7.4 (H) 01/11/2016   HGBA1C 8.3 (H) 09/15/2015    Lab Results  Component Value Date   MICROALBUR 5.0 (H) 10/28/2014   LDLCALC 80 09/15/2015   CREATININE 1.41 08/04/2016   Office Visit on 08/09/2016  Component Date Value Ref Range Status  . Hemoglobin A1C 08/09/2016 8.0   Final  Lab on 08/04/2016  Component Date Value Ref Range Status  . Sodium 08/04/2016 141  135 - 145 mEq/L Final  . Potassium 08/04/2016 4.1  3.5 - 5.1 mEq/L Final  . Chloride 08/04/2016 110  96 - 112 mEq/L Final  . CO2 08/04/2016 25  19 - 32 mEq/L Final  . Glucose, Bld 08/04/2016 143* 70 - 99 mg/dL Final  . BUN 08/04/2016 27*  6 - 23 mg/dL Final  . Creatinine, Ser 08/04/2016 1.41  0.40 - 1.50 mg/dL Final  . Calcium 08/04/2016 10.3  8.4 - 10.5 mg/dL Final  . GFR 08/04/2016 64.23  >60.00 mL/min Final  . Fructosamine 08/04/2016 394* 0 - 285 umol/L Final   Comment: Published reference interval for apparently healthy subjects between age 22 and 31 is 8 - 285 umol/L and in a poorly controlled diabetic population is 228 - 563 umol/L with a mean of 396 umol/L.      Allergies as of 08/09/2016      Reactions   Hydrochlorothiazide    Gout , uncontrolled diabetes and renal insufficiency   Allopurinol    Renal insufficiency      Medication List       Accurate as of 08/09/16  9:13 AM. Always use your most recent med list.          accu-chek softclix lancets Use to test blood sugar 2 times daily- Dx code E11.29   amLODipine 10 MG tablet Commonly known as:  NORVASC Take 1 tablet (10 mg total) by mouth daily. --- Office visit needed for further refills   cloNIDine  0.1 MG tablet Commonly known as:  CATAPRES TAKE 1 TABLET TWICE DAILY   diazepam 10 MG tablet Commonly known as:  VALIUM   glimepiride 1 MG tablet Commonly known as:  AMARYL Take 1 tablet (1 mg total) by mouth daily before supper.   glucose blood test strip Commonly known as:  ACCU-CHEK AVIVA PLUS Use as instructed to check blood sugars 2 times per day dx code E11.29   isosorbide mononitrate 60 MG 24 hr tablet Commonly known as:  IMDUR TAKE 1 TABLET TWICE DAILY.   losartan 100 MG tablet Commonly known as:  COZAAR TAKE 1 TABLET EVERY DAY   metFORMIN 750 MG 24 hr tablet Commonly known as:  GLUCOPHAGE-XR Take 1 tablet (750 mg total) by mouth daily with supper.   metoprolol succinate 100 MG 24 hr tablet Commonly known as:  TOPROL-XL TAKE 1 TABLET TWICE DAILY   silodosin 4 MG Caps capsule Commonly known as:  RAPAFLO Take 1 capsule (4 mg total) by mouth at bedtime.       Allergies:  Allergies  Allergen Reactions  . Hydrochlorothiazide     Gout , uncontrolled diabetes and renal insufficiency  . Allopurinol     Renal insufficiency    Past Medical History:  Diagnosis Date  . Cataract   . Colon polyp 2003   Dr [name]; F/U was to be 2008( not completed)  . CVA (cerebral infarction) 2014  . Diabetes mellitus   . Diverticulosis 2003  . Gout   . Hypertension   . Myocardial infarction 2014  . Pneumonia 09/29/11   Avelox X 10 days as OP  . Prostate cancer ([place])   . Renal insufficiency   . Seizures ([place])    not treated for seizure disorder; had a seizure after stroke 2014; no seizure since then    Past Surgical History:  Procedure Laterality Date  . COLONOSCOPY W/ POLYPECTOMY  2003   no F/U ([place] discussed 12/03/12)  . PEG PLACEMENT  2014  . PEG TUBE REMOVAL  2014  . PROSTATE BIOPSY    . REFRACTIVE SURGERY  2005  . TRACHEOSTOMY  2014   was closed after hospital stay  . wrist aspiration  02/16/12    monosodium urate crystals; Dr Caralyn Guile    Family History    Problem Relation Age of  Onset  . Hypertension Father   . Prostate cancer Father   . Diabetes Father   . Cancer Father   . Hypertension Mother   . Diabetes Mother   . Stroke Neg Hx   . Heart disease Neg Hx   . Colon cancer Neg Hx     Social History:  reports that he quit smoking about 48 years ago. His smoking use included Cigarettes. He has a 0.25 pack-year smoking history. He has never used smokeless tobacco. He reports that he does not drink alcohol or use drugs.    Review of Systems   Lipids: Normal and  not on a statin drug  Lab Results  Component Value Date   CHOL 141 09/15/2015   HDL 49.60 09/15/2015   LDLCALC 80 09/15/2015   TRIG 56.0 09/15/2015   CHOLHDL 3 09/15/2015    Eye exams: Last 2017, was told to have some problem, he does not know what    He has mild renal insufficiency of unclear etiology with variable creatinine levels, now near normal   Lab Results  Component Value Date   CREATININE 1.41 08/04/2016    HYPERTENSION:   He thinks he is taking all his medications as directed Has not followed up with PCP recently   Physical Examination:  BP 124/72   Pulse (!) 50   Ht 5\' 6"  (1.676 m)   Wt 196 lb (88.9 kg)   SpO2 97%   BMI 31.64 kg/m    no  edema   ASSESSMENT/PLAN:  Diabetes type 2, uncontrolled   See history of present illness for detailed discussion of current diabetes management, blood sugar patterns and problems identified  He has been irregular with his follow-up and now has a higher A1c of 8% Currently taking only metformin 750 mg, previously dosed limited by renal dysfunction Reportedly doing fairly well in his diet and is trying to exercise also  Although he has readings mostly in the 140s at home in the morning and at night recently his fructosamine is also quite high Most likely will need additional medications Since he does not want to use insulin because of cost he can probably be benefiting from adding 1 mg Amaryl as well  as increasing metformin to 1500 mg Currently renal function is adequate Discussed potential for hypoglycemia with Amaryl  Discussed blood sugar targets at various times He does need to try to avoid high carbohydrate meals and have a protein at breakfast consistently  Patient Instructions  Check blood sugars on waking up  2-3x weekly  Also check blood sugars about 2 hours after a meal and do this after different meals by rotation  Recommended blood sugar levels on waking up is 90-130 and about 2 hours after meal is 130-160  Please bring your blood sugar monitor to each visit, thank you     [facility] 08/09/2016, 9:13 AM

## 2016-08-09 NOTE — Progress Notes (Signed)
He presents today chief complaint of painful elongated toenails.  Objective: Vital signs are stable he is alert and oriented 3. Pulses are palpable. Neurologic sensorium is intact. Toenails are long thick yellow dystrophic onychomycotic painful palpation. Pulses are palpable. Neurologic insert was intact. Degenerative flexors are intact bilateral muscle strength is 5 over 5 dorsiflexion plantar flexors and inverters everters all of his musculature is intact. Nails are thick yellow dystrophic with mycotic painful palpation.  Assessment: Pain in limb secondary to onychomycosis.  Plan: Debridement of nails 1 through 5 bilateral cover service secondary pain and diabetes.

## 2016-08-09 NOTE — Patient Instructions (Signed)
Check blood sugars on waking up  2-3x weekly  Also check blood sugars about 2 hours after a meal and do this after different meals by rotation  Recommended blood sugar levels on waking up is 90-130 and about 2 hours after meal is 130-160  Please bring your blood sugar monitor to each visit, thank you

## 2016-08-14 ENCOUNTER — Other Ambulatory Visit: Payer: Self-pay | Admitting: Emergency Medicine

## 2016-08-14 ENCOUNTER — Other Ambulatory Visit: Payer: Self-pay | Admitting: *Deleted

## 2016-08-14 MED ORDER — METOPROLOL SUCCINATE ER 100 MG PO TB24: 100.0000 mg | ORAL_TABLET | Freq: Two times a day (BID) | ORAL | 0 refills | Status: DC

## 2016-08-14 NOTE — Patient Outreach (Signed)
[place] [facility]) Care Management  08/14/2016  PER BEAGLEY 1945-11-22 [id number]   CSW will perform a case closure on patient, due to inability to establish initial phone contact with patient, despite required number of phone attempts made and outreach letter mailed to patient's home.  CSW will notify patient's [place] with [place] Management, Johny Shock of CSW's plans to close patient's case.  CSW will fax an update to patient's Primary Care Physician, Dr. Billey Gosling to ensure that they are aware of CSW's involvement with patient's plan of care.  CSW will submit a case closure request to Verlon Setting, Care Management Assistant with [place] Management, in the form of an [employer].  CSW will ensure that Mrs. Comer is aware of Stacie Glaze, [place] with [place] Management, continued involvement with patient's care. Nat Christen, BSW, MSW, LCSW  Licensed Education officer, environmental Health System  Mailing [place] [address] [place], [place] [address] Physical Address-[address], [place], [place] [address] Toll Free Main # [phone] Fax # [phone] Cell # [phone]  Office # [phone] [email or web address]

## 2016-08-14 NOTE — Patient Outreach (Signed)
[facility]) Care Management  08/14/2016  Corey [name] 28-Jul-1945 [id number]  RN Health Coach  attempted #1 Follow up outreach call to patient.  Patient was unavailable. HIPPA compliance message was left with return callback number.  Plan: RN will call patient again within 14 days.   [place] Care Management [phone]

## 2016-08-17 ENCOUNTER — Other Ambulatory Visit: Payer: Self-pay | Admitting: *Deleted

## 2016-08-17 MED ORDER — METOPROLOL SUCCINATE ER 100 MG PO TB24: 100.0000 mg | ORAL_TABLET | Freq: Two times a day (BID) | ORAL | 1 refills | Status: DC

## 2016-08-21 ENCOUNTER — Other Ambulatory Visit: Payer: Self-pay | Admitting: *Deleted

## 2016-08-21 NOTE — Patient Outreach (Signed)
[place] [facility]) Care Management  08/21/2016  Corey [name] [birth year] [id number]    RN Health Coach  Attempted #2  Follow up outreach call to patient.  Patient was unavailable. HIPPA compliance  message was left with wife and  return callback number given.  Plan: RN will call patient again within 14 days.   [place] Care Management [phone]

## 2016-08-29 ENCOUNTER — Encounter: Payer: Self-pay | Admitting: *Deleted

## 2016-08-29 ENCOUNTER — Other Ambulatory Visit: Payer: Self-pay | Admitting: *Deleted

## 2016-08-29 NOTE — Patient Outreach (Signed)
[place] [facility]) Care Management  08/29/2016  Corey [name] 1946/03/19 [id number]   RN Health Coach attempted #3 outreach  telephone call to patient.  Phone line continuous busy. RN sent unsuccessful outreach letter. Plan: RN will send closure letter within 10 business days if no return outreach call.  [place] Care Management [phone]

## 2016-09-06 ENCOUNTER — Ambulatory Visit (INDEPENDENT_AMBULATORY_CARE_PROVIDER_SITE_OTHER): Payer: Medicare PPO | Admitting: Internal Medicine

## 2016-09-06 ENCOUNTER — Encounter: Payer: Self-pay | Admitting: Internal Medicine

## 2016-09-06 ENCOUNTER — Ambulatory Visit (INDEPENDENT_AMBULATORY_CARE_PROVIDER_SITE_OTHER)
Admission: RE | Admit: 2016-09-06 | Discharge: 2016-09-06 | Disposition: A | Discharge status: Home / Self Care | Payer: Medicare PPO | Source: Ambulatory Visit | Attending: Internal Medicine | Admitting: Internal Medicine

## 2016-09-06 ENCOUNTER — Telehealth: Payer: Self-pay | Admitting: Internal Medicine

## 2016-09-06 ENCOUNTER — Other Ambulatory Visit (INDEPENDENT_AMBULATORY_CARE_PROVIDER_SITE_OTHER): Payer: Medicare PPO

## 2016-09-06 VITALS — BP 160/80 | HR 60 | Temp 98.7°F | Resp 14 | Ht 66.0 in | Wt 196.0 lb

## 2016-09-06 DIAGNOSIS — M25472 Effusion, left ankle: Secondary | ICD-10-CM

## 2016-09-06 DIAGNOSIS — I10 Essential (primary) hypertension: Secondary | ICD-10-CM

## 2016-09-06 DIAGNOSIS — M10072 Idiopathic gout, left ankle and foot: Secondary | ICD-10-CM

## 2016-09-06 LAB — CBC WITH DIFFERENTIAL/PLATELET
BASOS PCT: 0.7 % (ref 0.0–3.0)
Basophils Absolute: 0 10*3/uL (ref 0.0–0.1)
EOS PCT: 2.8 % (ref 0.0–5.0)
Eosinophils Absolute: 0.1 10*3/uL (ref 0.0–0.7)
HEMATOCRIT: 36.8 % — AB (ref 39.0–52.0)
HEMOGLOBIN: 12.1 g/dL — AB (ref 13.0–17.0)
Lymphocytes Relative: 24.6 % (ref 12.0–46.0)
Lymphs Abs: 1.1 10*3/uL (ref 0.7–4.0)
MCHC: 32.8 g/dL (ref 30.0–36.0)
MCV: 79.1 fl (ref 78.0–100.0)
MONO ABS: 0.6 10*3/uL (ref 0.1–1.0)
MONOS PCT: 13.9 % — AB (ref 3.0–12.0)
Neutro Abs: 2.6 10*3/uL (ref 1.4–7.7)
Neutrophils Relative %: 58 % (ref 43.0–77.0)
Platelets: 193 10*3/uL (ref 150.0–400.0)
RBC: 4.66 Mil/uL (ref 4.22–5.81)
RDW: 14.6 % (ref 11.5–15.5)
WBC: 4.5 10*3/uL (ref 4.0–10.5)

## 2016-09-06 LAB — BASIC METABOLIC PANEL
BUN: 23 mg/dL (ref 6–23)
CALCIUM: 10.2 mg/dL (ref 8.4–10.5)
CO2: 25 mEq/L (ref 19–32)
CREATININE: 1.52 mg/dL — AB (ref 0.40–1.50)
Chloride: 109 mEq/L (ref 96–112)
GFR: 58.88 mL/min — ABNORMAL LOW (ref >60.00)
GLUCOSE: 136 mg/dL — AB (ref 70–99)
Potassium: 4.2 mEq/L (ref 3.5–5.1)
SODIUM: 140 meq/L (ref 135–145)

## 2016-09-06 LAB — URIC ACID: Uric Acid, Serum: 6.9 mg/dL (ref 4.0–7.8)

## 2016-09-06 LAB — SEDIMENTATION RATE: Sed Rate: 37 mm/hr — ABNORMAL HIGH (ref 0–20)

## 2016-09-06 MED ORDER — OXYCODONE-ACETAMINOPHEN 7.5-325 MG PO TABS: 1.0000 | ORAL_TABLET | Freq: Four times a day (QID) | ORAL | 0 refills | Status: DC | PRN

## 2016-09-06 MED ORDER — COLCHICINE 0.6 MG PO TABS: 0.6000 mg | ORAL_TABLET | Freq: Every day | ORAL | 0 refills | Status: DC

## 2016-09-06 MED ORDER — METHYLPREDNISOLONE 4 MG PO TBPK: ORAL_TABLET | ORAL | 0 refills | Status: DC

## 2016-09-06 NOTE — Progress Notes (Signed)
Subjective:  Patient ID: Corey [name], male    DOB: 08/23/1945  Age: 71 y.o. MRN: [id number]  CC: Ankle Pain  New to me  HPI Corey [name] presents for Concerns about a 3 day history of pain and swelling in his left distal ankle and proximal foot. He does have a history of gout which is not being treated. He denies fever, chills, lower extremity edema, rash, or lymphadenopathy. The pain is exacerbated by palpation and weightbearing activity.  Outpatient Medications Prior to Visit  Medication Sig Dispense Refill  . amLODipine (NORVASC) 10 MG tablet Take 1 tablet (10 mg total) by mouth daily. --- Office visit needed for further refills 90 tablet 0  . cloNIDine (CATAPRES) 0.1 MG tablet TAKE 1 TABLET TWICE DAILY 180 tablet 1  . diazepam (VALIUM) 10 MG tablet     . glimepiride (AMARYL) 1 MG tablet Take 1 tablet (1 mg total) by mouth daily before supper. 90 tablet 0  . glucose blood (ACCU-CHEK AVIVA PLUS) test strip Use as instructed to check blood sugars 2 times per day dx code E11.29 200 each 2  . isosorbide mononitrate (IMDUR) 60 MG 24 hr tablet TAKE 1 TABLET TWICE DAILY. 180 tablet 0  . Lancet Devices (ACCU-CHEK SOFTCLIX) lancets Use to test blood sugar 2 times daily- Dx code E11.29 1 each 3  . losartan (COZAAR) 100 MG tablet TAKE 1 TABLET EVERY DAY 90 tablet 1  . metFORMIN (GLUCOPHAGE-XR) 750 MG 24 hr tablet Take 1 tablet (750 mg total) by mouth daily with supper. 90 tablet 1  . metoprolol succinate (TOPROL-XL) 100 MG 24 hr tablet Take 1 tablet (100 mg total) by mouth 2 (two) times daily. Yearly physical due in Sept 180 tablet 1  . silodosin (RAPAFLO) 4 MG CAPS capsule Take 1 capsule (4 mg total) by mouth at bedtime. 30 capsule 6   No facility-administered medications prior to visit.     ROS Review of Systems  Constitutional: Negative for chills, diaphoresis, fatigue and fever.  HENT: Negative.   Eyes: Negative for visual disturbance.  Respiratory: Negative for cough, chest tightness,  shortness of breath and wheezing.   Cardiovascular: Negative for chest pain, palpitations and leg swelling.  Gastrointestinal: Negative for abdominal pain, diarrhea, nausea and vomiting.  Endocrine: Negative.   Genitourinary: Negative.  Negative for difficulty urinating.  Musculoskeletal: Positive for arthralgias. Negative for back pain and neck pain.  Skin: Negative.   Allergic/Immunologic: Negative.   Neurological: Negative.   Hematological: Negative for adenopathy. Does not bruise/bleed easily.  Psychiatric/Behavioral: Negative.     Objective:  BP (!) 160/80 (BP Location: Left Arm, Patient Position: Sitting, Cuff Size: Normal)   Pulse 60   Temp 98.7 F (37.1 C) (Oral)   Resp 14   Ht 5\' 6"  (1.676 m)   Wt 196 lb (88.9 kg)   SpO2 99%   BMI 31.64 kg/m   BP Readings from Last 3 Encounters:  09/06/16 (!) 160/80  08/09/16 124/72  03/28/16 (!) 154/86    Wt Readings from Last 3 Encounters:  09/06/16 196 lb (88.9 kg)  08/09/16 196 lb (88.9 kg)  03/28/16 200 lb 9.6 oz (91 kg)    Physical Exam  Constitutional: No distress.  HENT:  Mouth/Throat: Oropharynx is clear and moist. No oropharyngeal exudate.  Eyes: Conjunctivae are normal. Right eye exhibits no discharge. Left eye exhibits no discharge. No scleral icterus.  Neck: Normal range of motion. Neck supple. No JVD present. No tracheal deviation present. No thyromegaly  present.  Cardiovascular: Normal rate, regular rhythm, normal heart sounds and intact distal pulses.  Exam reveals no gallop and no friction rub.   No murmur heard. Pulmonary/Chest: Effort normal and breath sounds normal. No respiratory distress. He has no wheezes. He has no rales. He exhibits no tenderness.  Abdominal: Soft. Bowel sounds are normal. He exhibits no distension and no mass. There is no tenderness. There is no rebound and no guarding.  Musculoskeletal:       Left ankle: He exhibits swelling. He exhibits no deformity. Tenderness. Achilles tendon  exhibits no pain, no defect and normal Thompson's test results.       Left foot: There is tenderness and swelling. There is normal range of motion and no deformity.  There is an area of erythema, swelling, and tenderness over the left distal ankle and proximal anterior foot. The overlying skin is normal with no vesicles, pustules, induration, or fluctuance.  Lymphadenopathy:    He has no cervical adenopathy.  Skin: He is not diaphoretic.  Vitals reviewed.   Lab Results  Component Value Date   WBC 4.4 06/16/2015   HGB 12.9 (L) 06/16/2015   HCT 40.0 06/16/2015   PLT 197.0 06/16/2015   GLUCOSE 143 (H) 08/04/2016   CHOL 141 09/15/2015   TRIG 56.0 09/15/2015   HDL 49.60 09/15/2015   LDLCALC 80 09/15/2015   ALT 10 09/15/2015   AST 15 09/15/2015   NA 141 08/04/2016   K 4.1 08/04/2016   CL 110 08/04/2016   CREATININE 1.41 08/04/2016   BUN 27 (H) 08/04/2016   CO2 25 08/04/2016   TSH 1.06 12/03/2012   PSA 4.48 (H) 06/16/2015   INR 1.21 09/30/2012   HGBA1C 8.0 08/09/2016   MICROALBUR 3.5 (H) 08/09/2016    No results found.  Assessment & Plan:   Corey [name] was seen today for ankle pain.  Diagnoses and all orders for this visit:  Acute idiopathic gout of left ankle- he has not achieved his uric acid goal but at this time I don't think he should start an agent that changes his uric acid level as it may worsen this episode of gout. He does have renal insufficiency so will need to avoid anti-inflammatories. Will treat this acute flare with colchicine, systemic steroids, and Percocet as needed. I have asked to return in about 3-4 weeks to consider a uric acid lowering agent. -     Uric acid; Future -     colchicine (COLCRYS) 0.6 MG tablet; Take 1 tablet (0.6 mg total) by mouth daily. -     methylPREDNISolone (MEDROL DOSEPAK) 4 MG TBPK tablet; TAKE AS DIRECTED -     oxyCODONE-acetaminophen (PERCOCET) 7.5-325 MG tablet; Take 1 tablet by mouth every 6 (six) hours as needed.  Ankle swelling,  left- his symptoms, exam, x-ray, and lab work are consistent with acute gouty arthropathy -     Sedimentation rate; Future -     DG Ankle Complete Left; Future -     DG Foot Complete Left; Future  Essential hypertension- his BP is adequately well controlled -     CBC with Differential/Platelet; Future -     Basic metabolic panel; Future   I am having Mr. Huebert start on colchicine, methylPREDNISolone, and oxyCODONE-acetaminophen. I am also having him maintain his diazepam, silodosin, losartan, cloNIDine, amLODipine, isosorbide mononitrate, accu-chek softclix, glucose blood, metFORMIN, glimepiride, and metoprolol succinate.  Meds ordered this encounter  Medications  . colchicine (COLCRYS) 0.6 MG tablet    Sig: Take  1 tablet (0.6 mg total) by mouth daily.    Dispense:  180 tablet    Refill:  0  . methylPREDNISolone (MEDROL DOSEPAK) 4 MG TBPK tablet    Sig: TAKE AS DIRECTED    Dispense:  21 tablet    Refill:  0  . oxyCODONE-acetaminophen (PERCOCET) 7.5-325 MG tablet    Sig: Take 1 tablet by mouth every 6 (six) hours as needed.    Dispense:  35 tablet    Refill:  0     Follow-up: Return in about 3 weeks (around 09/27/2016).  Scarlette Calico, MD

## 2016-09-06 NOTE — Telephone Encounter (Signed)
Pt was prescribed colchicine (COLCRYS) 0.6 MG tablet  Pt cannot afford this can something else be sent in.  [facility]  [address]

## 2016-09-06 NOTE — Progress Notes (Signed)
Pre visit review using our clinic review tool, if applicable. No additional management support is needed unless otherwise documented below in the visit note. 

## 2016-09-06 NOTE — Patient Instructions (Signed)

## 2016-09-07 NOTE — Telephone Encounter (Signed)
Spoke with pt to inform.  

## 2016-09-07 NOTE — Telephone Encounter (Signed)
The steroid he received may be enough to help the gout - call if no improvement after finishing that

## 2016-09-12 ENCOUNTER — Encounter: Payer: Self-pay | Admitting: *Deleted

## 2016-09-13 NOTE — Telephone Encounter (Signed)
This encounter was created in error - please disregard.

## 2016-09-14 ENCOUNTER — Other Ambulatory Visit: Payer: Self-pay | Admitting: Internal Medicine

## 2016-09-18 ENCOUNTER — Other Ambulatory Visit: Payer: Self-pay | Admitting: Internal Medicine

## 2016-10-05 ENCOUNTER — Other Ambulatory Visit: Payer: Medicare PPO

## 2016-10-10 ENCOUNTER — Ambulatory Visit: Payer: Medicare PPO | Admitting: Endocrinology

## 2016-10-12 ENCOUNTER — Other Ambulatory Visit: Payer: Self-pay | Admitting: Endocrinology

## 2016-10-31 ENCOUNTER — Ambulatory Visit: Payer: Medicare PPO | Admitting: Podiatry

## 2016-11-21 ENCOUNTER — Other Ambulatory Visit: Payer: Self-pay | Admitting: Internal Medicine

## 2016-11-30 ENCOUNTER — Ambulatory Visit: Payer: Medicare PPO | Admitting: Podiatry

## 2017-01-22 ENCOUNTER — Ambulatory Visit: Payer: Medicare PPO

## 2017-01-22 ENCOUNTER — Telehealth: Payer: Self-pay

## 2017-01-22 VITALS — BP 202/100

## 2017-01-22 DIAGNOSIS — I10 Essential (primary) hypertension: Secondary | ICD-10-CM

## 2017-01-22 NOTE — Telephone Encounter (Signed)
Patient was asked to see his primary care office today and have his bp checked---was reading high at the other doctors office, todays visit also reading high at 202/100, asymptomatic, denies SOB,headache, dizziness or blurred vision--per dr burns, patient has been asked to increase catapres, 2 tablets twice daily----2 tabs in AM and 2 tabs in PM, until patient is seen by dr burns, patient has scheduled follow up appt with dr burns on 9/11 at 11am--I have written instructions and appt time down on paper and patient repeated back for understanding---wife also in room and understands directions, too--patient and wife advised-if this increase in med does not help,and patients bp continues to rise or if patient becomes symptomatic, he is to go to ED

## 2017-01-22 NOTE — Progress Notes (Signed)
Subjective:    Patient ID: Corey [name], male    DOB: [birth year], 71 y.o.   MRN: [id number]  HPI The patient is here for follow up.  Hypertension: He is taking his medication daily. He is not compliant with a low sodium diet.  He denies chest pain, palpitations, edema, and regular headaches. He is exercising - walks twice a week.  He does not monitor his blood pressure at home.    BP Readings from Last 3 Encounters:  01/23/17 (!) 174/92  01/22/17 (!) 202/100  09/06/16 (!) 160/80   Diabetes: He is taking his medication daily as prescribed. He is compliant with a diabetic diet. He has not seen Dr. Dwyane Dee in approximately 6 months. He is exercising twice a week. He monitors his sugars and they have been running 102, 115.   CKD:  He does not take any nsaids.  He drinks a lot of water during the day.   Poor balance:  His notices it most when he first stands after he is sitting for awhile.  It gets better after he walks for a while.  His wife notices it when he is walking in his house.  He walks slow.  He is exercising twice a week-walking. He denies any numbness, tingling or weakness in his arms or legs. He does feel like this is something newer.  Medications and allergies reviewed with patient and updated if appropriate.  Patient Active Problem List   Diagnosis Date Noted  . Ankle swelling, left 09/06/2016  . Bradycardia 01/28/2016  . Weak urinary stream 12/31/2015  . Chronic kidney disease 09/15/2015  . Ventral hernia without obstruction or gangrene 09/15/2015  . Prostate cancer ([place]) 07/25/2015  . Erectile dysfunction 05/04/2015  . Urinary frequency 05/04/2015  . 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 Abnormal finding on MRI of brain 02/14/2015  . PEA (Pulseless electrical activity) ([place]) 12/03/2012  . Diabetes ([place]) 06/30/2011  . Acute idiopathic gout of left ankle 03/05/2007  . Essential hypertension 03/05/2007    Current Outpatient Prescriptions on  File Prior to Visit  Medication Sig Dispense Refill  . amLODipine (NORVASC) 10 MG tablet Take 1 tablet (10 mg total) by mouth daily. --- Office visit needed for further refills 90 tablet 0  . cloNIDine (CATAPRES) 0.1 MG tablet Take 1 tablet (0.1 mg total) by mouth 2 (two) times daily. -- Office visit needed for further refills 180 tablet 0  . colchicine (COLCRYS) 0.6 MG tablet Take 1 tablet (0.6 mg total) by mouth daily. 180 tablet 0  . diazepam (VALIUM) 10 MG tablet     . glimepiride (AMARYL) 1 MG tablet TAKE 1 TABLET DAILY BEFORE SUPPER. 90 tablet 0  . glucose blood (ACCU-CHEK AVIVA PLUS) test strip Use as instructed to check blood sugars 2 times per day dx code E11.29 200 each 2  . isosorbide mononitrate (IMDUR) 60 MG 24 hr tablet Take 1 tablet (60 mg total) by mouth 2 (two) times daily. -- Office visit needed for further refills 180 tablet 0  . Lancet Devices (ACCU-CHEK SOFTCLIX) lancets Use to test blood sugar 2 times daily- Dx code E11.29 1 each 3  . losartan (COZAAR) 100 MG tablet Take 1 tablet (100 mg total) by mouth daily. -- Office visit needed for further refills 90 tablet 0  . metFORMIN (GLUCOPHAGE-XR) 750 MG 24 hr tablet Take 1 tablet (750 mg total) by mouth daily with supper. 90 tablet 1  . methylPREDNISolone (MEDROL DOSEPAK) 4 MG  TBPK tablet TAKE AS DIRECTED 21 tablet 0  . metoprolol succinate (TOPROL-XL) 100 MG 24 hr tablet Take 1 tablet (100 mg total) by mouth 2 (two) times daily. Yearly physical due in Sept 180 tablet 1  . oxyCODONE-acetaminophen (PERCOCET) 7.5-325 MG tablet Take 1 tablet by mouth every 6 (six) hours as needed. 35 tablet 0  . silodosin (RAPAFLO) 4 MG CAPS capsule Take 1 capsule (4 mg total) by mouth at bedtime. 30 capsule 6   No current facility-administered medications on file prior to visit.     Past Medical History:  Diagnosis Date  . Cataract   . Colon polyp 2003   Dr [name]; F/U was to be 2008( not completed)  . CVA (cerebral infarction) 2014  .  Diabetes mellitus   . Diverticulosis 2003  . Gout   . Hypertension   . Myocardial infarction ([place]) 2014  . Pneumonia 09/29/11   Avelox X 10 days as OP  . Prostate cancer ([place])   . Renal insufficiency   . Seizures ([place])    not treated for seizure disorder; had a seizure after stroke 2014; no seizure since then    Past Surgical History:  Procedure Laterality Date  . COLONOSCOPY W/ POLYPECTOMY  2003   no F/U ([place] discussed 12/03/12)  . PEG PLACEMENT  2014  . PEG TUBE REMOVAL  2014  . PROSTATE BIOPSY    . REFRACTIVE SURGERY  2005  . TRACHEOSTOMY  2014   was closed after hospital stay  . wrist aspiration  02/16/12    monosodium urate crystals; Dr Caralyn Guile    Social History   Social History  . Marital status: Married    Spouse name: N/A  . Number of children: N/A  . Years of education: N/A   Social History Main Topics  . Smoking status: Former Smoker    Packs/day: 0.25    Years: 1.00    Types: Cigarettes    Quit date: 05/15/1968  . Smokeless tobacco: Never Used     Comment: smoked [place], up to 1-2 cigarettes/ day  . Alcohol use No  . Drug use: No  . Sexual activity: Yes   Other Topics Concern  . None   Social History Narrative   Walks three times a week, 2-3 miles, occasional walks on treadmill        Family History  Problem Relation Age of Onset  . Hypertension Father   . Prostate cancer Father   . Diabetes Father   . Cancer Father   . Hypertension Mother   . Diabetes Mother   . Stroke Neg Hx   . Heart disease Neg Hx   . Colon cancer Neg Hx     Review of Systems  Constitutional: Positive for chills. Negative for fever.  HENT: Negative for postnasal drip.   Respiratory: Positive for cough and shortness of breath (sometimes when walking). Negative for wheezing.   Cardiovascular: Negative for chest pain, palpitations and leg swelling.  Gastrointestinal:       No gerd  Neurological: Positive for light-headedness (seldom). Negative for headaches.        Objective:   Vitals:   01/23/17 1110  BP: (!) 174/92  Pulse: (!) 53  Resp: 16  Temp: 97.9 F (36.6 C)  SpO2: 98%   Wt Readings from Last 3 Encounters:  01/23/17 195 lb (88.5 kg)  09/06/16 196 lb (88.9 kg)  08/09/16 196 lb (88.9 kg)   Body mass index is 31.47 kg/m.  Physical Exam    Constitutional: Appears well-developed and well-nourished. No distress.  HENT:  Head: Normocephalic and atraumatic.  Neck: Neck supple. No tracheal deviation present. No thyromegaly present.  No cervical lymphadenopathy Cardiovascular: Normal rate, regular rhythm and normal heart sounds.   No murmur heard. No carotid bruit .  No edema Pulmonary/Chest: Effort normal and breath sounds normal. No respiratory distress. No has no wheezes. No rales.  Neurological:  CN II-XII intact, normal strength and sensation all extremities  Skin: Skin is warm and dry. Not diaphoretic.  Psychiatric: Normal mood and affect. Behavior is normal.      Assessment & Plan:    See Problem List for Assessment and Plan of chronic medical problems.

## 2017-01-23 ENCOUNTER — Encounter: Payer: Self-pay | Admitting: Internal Medicine

## 2017-01-23 ENCOUNTER — Ambulatory Visit (INDEPENDENT_AMBULATORY_CARE_PROVIDER_SITE_OTHER): Payer: Medicare PPO | Admitting: Internal Medicine

## 2017-01-23 ENCOUNTER — Other Ambulatory Visit (INDEPENDENT_AMBULATORY_CARE_PROVIDER_SITE_OTHER): Payer: Medicare PPO

## 2017-01-23 VITALS — BP 174/92 | HR 53 | Temp 97.9°F | Resp 16 | Wt 195.0 lb

## 2017-01-23 DIAGNOSIS — Z794 Long term (current) use of insulin: Secondary | ICD-10-CM

## 2017-01-23 DIAGNOSIS — N189 Chronic kidney disease, unspecified: Secondary | ICD-10-CM | POA: Diagnosis not present

## 2017-01-23 DIAGNOSIS — Z23 Encounter for immunization: Secondary | ICD-10-CM | POA: Diagnosis not present

## 2017-01-23 DIAGNOSIS — R2689 Other abnormalities of gait and mobility: Secondary | ICD-10-CM

## 2017-01-23 DIAGNOSIS — I10 Essential (primary) hypertension: Secondary | ICD-10-CM | POA: Diagnosis not present

## 2017-01-23 DIAGNOSIS — E1122 Type 2 diabetes mellitus with diabetic chronic kidney disease: Secondary | ICD-10-CM

## 2017-01-23 LAB — CBC WITH DIFFERENTIAL/PLATELET
BASOS ABS: 0 10*3/uL (ref 0.0–0.1)
Basophils Relative: 1.1 % (ref 0.0–3.0)
Eosinophils Absolute: 0.1 10*3/uL (ref 0.0–0.7)
Eosinophils Relative: 3.7 % (ref 0.0–5.0)
HCT: 37.5 % — ABNORMAL LOW (ref 39.0–52.0)
Hemoglobin: 12.2 g/dL — ABNORMAL LOW (ref 13.0–17.0)
LYMPHS ABS: 0.9 10*3/uL (ref 0.7–4.0)
Lymphocytes Relative: 32 % (ref 12.0–46.0)
MCHC: 32.7 g/dL (ref 30.0–36.0)
MCV: 81.3 fl (ref 78.0–100.0)
MONOS PCT: 12.7 % — AB (ref 3.0–12.0)
Monocytes Absolute: 0.4 10*3/uL (ref 0.1–1.0)
NEUTROS ABS: 1.4 10*3/uL (ref 1.4–7.7)
NEUTROS PCT: 50.5 % (ref 43.0–77.0)
Platelets: 147 10*3/uL — ABNORMAL LOW (ref 150.0–400.0)
RBC: 4.61 Mil/uL (ref 4.22–5.81)
RDW: 14.4 % (ref 11.5–15.5)
WBC: 2.8 10*3/uL — ABNORMAL LOW (ref 4.0–10.5)

## 2017-01-23 LAB — COMPREHENSIVE METABOLIC PANEL
ALBUMIN: 3.8 g/dL (ref 3.5–5.2)
ALT: 15 U/L (ref 0–53)
AST: 18 U/L (ref 0–37)
Alkaline Phosphatase: 45 U/L (ref 39–117)
BUN: 27 mg/dL — ABNORMAL HIGH (ref 6–23)
CHLORIDE: 108 meq/L (ref 96–112)
CO2: 26 mEq/L (ref 19–32)
Calcium: 9.9 mg/dL (ref 8.4–10.5)
Creatinine, Ser: 1.86 mg/dL — ABNORMAL HIGH (ref 0.40–1.50)
GFR: 46.59 mL/min — ABNORMAL LOW (ref >60.00)
Glucose, Bld: 159 mg/dL — ABNORMAL HIGH (ref 70–99)
Potassium: 4.5 mEq/L (ref 3.5–5.1)
SODIUM: 141 meq/L (ref 135–145)
TOTAL PROTEIN: 6.5 g/dL (ref 6.0–8.3)
Total Bilirubin: 0.7 mg/dL (ref 0.2–1.2)

## 2017-01-23 LAB — TSH: TSH: 1.13 u[IU]/mL (ref 0.35–4.50)

## 2017-01-23 LAB — HEMOGLOBIN A1C: HEMOGLOBIN A1C: 7.2 % — AB (ref 4.6–6.5)

## 2017-01-23 MED ORDER — AMLODIPINE BESYLATE 10 MG PO TABS: 10.0000 mg | ORAL_TABLET | Freq: Every day | ORAL | 1 refills | Status: DC

## 2017-01-23 MED ORDER — LOSARTAN POTASSIUM 100 MG PO TABS: 100.0000 mg | ORAL_TABLET | Freq: Every day | ORAL | 1 refills | Status: DC

## 2017-01-23 MED ORDER — CLONIDINE HCL 0.3 MG PO TABS: 0.3000 mg | ORAL_TABLET | Freq: Two times a day (BID) | ORAL | 1 refills | Status: DC

## 2017-01-23 MED ORDER — ISOSORBIDE MONONITRATE ER 60 MG PO TB24: 60.0000 mg | ORAL_TABLET | Freq: Two times a day (BID) | ORAL | 1 refills | Status: DC

## 2017-01-23 MED ORDER — ASPIRIN EC 81 MG PO TBEC: 81.0000 mg | DELAYED_RELEASE_TABLET | Freq: Every day | ORAL | Status: DC

## 2017-01-23 NOTE — Patient Instructions (Addendum)
Start taking baby aspirin 81 mg daily  Increase clonidine to 0.3 mg twice daily.  Continue your other medications.    Monitor your BP at home.   Have blood work done today.    Start exercising more and doing your balance exercises.   You had a flu vaccine today.    Follow up in 4 weeks.

## 2017-01-23 NOTE — Assessment & Plan Note (Addendum)
Following with endocrine-advised him to schedule a follow-up appointment Sugars are well controlled on Will check A1c, CMP

## 2017-01-23 NOTE — Assessment & Plan Note (Signed)
Blood pressure not controlled-has been very high recently for unknown reasons Increase clonidine to 0.3 mg twice daily Continue amlodipine, imdur, losartan and metoprolol at current doses CMP, TSH, CBC  follow-up in 4 weeks Advised them to monitor his blood pressure

## 2017-01-23 NOTE — Assessment & Plan Note (Signed)
Not associated with any numbness, tingling or weakness in his extremities Discussed options for evaluating further with neurological consultation or physical therapy or home balance exercises He would like to try balance exercises at home and going to the gym more Advised this is not effective we should consider physical therapy or neurology referral

## 2017-01-23 NOTE — Assessment & Plan Note (Signed)
Not taking any NSAIDs Drinking plenty of water CMP today

## 2017-01-24 ENCOUNTER — Telehealth: Payer: Self-pay | Admitting: Internal Medicine

## 2017-01-24 MED ORDER — HYDRALAZINE HCL 25 MG PO TABS: 25.0000 mg | ORAL_TABLET | Freq: Three times a day (TID) | ORAL | 2 refills | Status: DC

## 2017-01-24 NOTE — Telephone Encounter (Signed)
Spoke with pt to inform. Pt understood and will call back on Monday with BP readings. Advised we would send 90 day supply in on Monday to mail order.

## 2017-01-24 NOTE — Telephone Encounter (Signed)
Pt called reporting his BP today was  193/74 just a few minutes ago, he has not been doing any physical activity. He would like a call back. Please advise

## 2017-01-24 NOTE — Telephone Encounter (Signed)
Continue all his current meds.  Start hydralazine 25 mg three times a day.  Sent to pharmacy.

## 2017-01-25 ENCOUNTER — Telehealth: Payer: Self-pay | Admitting: Internal Medicine

## 2017-01-25 NOTE — Telephone Encounter (Signed)
Pt returned your call regarding his lab results. He can be reached at [phone].

## 2017-01-26 NOTE — Telephone Encounter (Signed)
Spoke with pt to inform.  

## 2017-01-30 ENCOUNTER — Telehealth: Payer: Self-pay | Admitting: Internal Medicine

## 2017-01-30 MED ORDER — HYDRALAZINE HCL 50 MG PO TABS: 50.0000 mg | ORAL_TABLET | Freq: Three times a day (TID) | ORAL | 5 refills | Status: DC

## 2017-01-30 NOTE — Telephone Encounter (Signed)
Increase hydralazine to 50 mg three times a day - he can double the dose of what he has and new script sent to pharm

## 2017-01-30 NOTE — Telephone Encounter (Signed)
Noted  

## 2017-01-30 NOTE — Telephone Encounter (Signed)
Pt informed of response and expressed understanding, I informed him to call back if he has any more BP issues, he states after we spoke he took his BP again and it was 150/80.

## 2017-01-30 NOTE — Telephone Encounter (Signed)
Pt states his BP is still high, today it was 180/95, please advise and call back

## 2017-02-19 ENCOUNTER — Ambulatory Visit: Payer: Medicare PPO | Admitting: Internal Medicine

## 2017-03-15 ENCOUNTER — Ambulatory Visit: Payer: Medicare PPO | Admitting: Internal Medicine

## 2017-03-28 ENCOUNTER — Telehealth: Payer: Self-pay | Admitting: Endocrinology

## 2017-03-29 NOTE — Telephone Encounter (Signed)
Please advise if okay to refill. Last OV was 07/2016

## 2017-03-29 NOTE — Telephone Encounter (Signed)
Needs appointment, refuse prescription

## 2017-03-29 NOTE — Telephone Encounter (Signed)
I have refused this medication and sent a message that patient needs an appointment.

## 2017-04-09 ENCOUNTER — Telehealth: Payer: Self-pay | Admitting: Endocrinology

## 2017-04-09 NOTE — Telephone Encounter (Signed)
[facility] pharmacy calling on the status of a form that was faxed to our office. Please advise [phone]

## 2017-04-10 NOTE — Telephone Encounter (Signed)
We need to see him before doing any forms

## 2017-04-10 NOTE — Telephone Encounter (Signed)
Has anyone seen this form?

## 2017-04-11 NOTE — Telephone Encounter (Signed)
Please schedule this patient per Dr. Dwyane Dee.

## 2017-04-12 NOTE — Patient Instructions (Addendum)
  Test(s) ordered today. Your results will be released to [place] (or called to you) after review, usually within 72hours after test completion. If any changes need to be made, you will be notified at that same time.  All other Health Maintenance issues reviewed.   All recommended immunizations and age-appropriate screenings are up-to-date or discussed.  No immunizations administered today.   Medications reviewed and updated.  No changes recommended at this time.   A referral was ordered for surgery  Please followup in 6 months

## 2017-04-12 NOTE — Progress Notes (Signed)
Subjective:    Patient ID: Corey [name], male    DOB: [birth year], 71 y.o.   MRN: [id number]  HPI The patient is here for follow up.  Hypertension: He is taking his medication daily. He is compliant with a low sodium diet.  He denies chest pain, palpitations, edema, shortness of breath and regular headaches. He is exercising regularly - some walking.  He does not monitor his blood pressure at home.    Diabetes: He is taking his medication daily as prescribed. He is somewhat compliant with a diabetic diet. He is exercising regularly - some walking. He monitors his sugars and they have been running 120,104. He checks his feet daily and denies foot lesions. He is not up-to-date with an ophthalmology examination.   CKD:  He does not take any NSAIDs.  He tries to drink enough water throughout the day.  Medications and allergies reviewed with patient and updated if appropriate.  Patient Active Problem List   Diagnosis Date Noted  . Poor balance 01/23/2017  . Ankle swelling, left 09/06/2016  . Bradycardia 01/28/2016  . Weak urinary stream 12/31/2015  . Chronic kidney disease 09/15/2015  . Ventral hernia without obstruction or gangrene 09/15/2015  . Prostate cancer ([place]) 07/25/2015  . Erectile dysfunction 05/04/2015  . Urinary frequency 05/04/2015  . 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 Abnormal finding on MRI of brain 02/14/2015  . PEA (Pulseless electrical activity) ([place]) 12/03/2012  . Diabetes ([place]) 06/30/2011  . Acute idiopathic gout of left ankle 03/05/2007  . Essential hypertension 03/05/2007    Current Outpatient Medications on File Prior to Visit  Medication Sig Dispense Refill  . amLODipine (NORVASC) 10 MG tablet Take 1 tablet (10 mg total) by mouth daily. 90 tablet 1  . aspirin EC 81 MG tablet Take 1 tablet (81 mg total) by mouth daily.    . cloNIDine (CATAPRES) 0.3 MG tablet Take 1 tablet (0.3 mg total) by mouth 2 (two) times daily. 180 tablet 1    . colchicine (COLCRYS) 0.6 MG tablet Take 1 tablet (0.6 mg total) by mouth daily. 180 tablet 0  . diazepam (VALIUM) 10 MG tablet     . glimepiride (AMARYL) 1 MG tablet TAKE 1 TABLET DAILY BEFORE SUPPER. 90 tablet 0  . glucose blood (ACCU-CHEK AVIVA PLUS) test strip Use as instructed to check blood sugars 2 times per day dx code E11.29 200 each 2  . hydrALAZINE (APRESOLINE) 50 MG tablet Take 1 tablet (50 mg total) by mouth 3 (three) times daily. 90 tablet 5  . isosorbide mononitrate (IMDUR) 60 MG 24 hr tablet Take 1 tablet (60 mg total) by mouth 2 (two) times daily. 180 tablet 1  . Lancet Devices (ACCU-CHEK SOFTCLIX) lancets Use to test blood sugar 2 times daily- Dx code E11.29 1 each 3  . losartan (COZAAR) 100 MG tablet Take 1 tablet (100 mg total) by mouth daily. 90 tablet 1  . metFORMIN (GLUCOPHAGE-XR) 750 MG 24 hr tablet Take 1 tablet (750 mg total) by mouth daily with supper. 90 tablet 1  . metoprolol succinate (TOPROL-XL) 100 MG 24 hr tablet Take 1 tablet (100 mg total) by mouth 2 (two) times daily. Yearly physical due in Sept 180 tablet 1  . silodosin (RAPAFLO) 4 MG CAPS capsule Take 1 capsule (4 mg total) by mouth at bedtime. 30 capsule 6   No current facility-administered medications on file prior to visit.     Past Medical History:  Diagnosis Date  .  Cataract   . Colon polyp 2003   Dr [name]; F/U was to be 2008( not completed)  . CVA (cerebral infarction) 2014  . Diabetes mellitus   . Diverticulosis 2003  . Gout   . Hypertension   . Myocardial infarction ([place]) 2014  . Pneumonia 09/29/11   Avelox X 10 days as OP  . Prostate cancer ([place])   . Renal insufficiency   . Seizures ([place])    not treated for seizure disorder; had a seizure after stroke 2014; no seizure since then    Past Surgical History:  Procedure Laterality Date  . COLONOSCOPY W/ POLYPECTOMY  2003   no F/U ([place] discussed 12/03/12)  . PEG PLACEMENT  2014  . PEG TUBE REMOVAL  2014  . PROSTATE BIOPSY    .  REFRACTIVE SURGERY  2005  . TRACHEOSTOMY  2014   was closed after hospital stay  . wrist aspiration  02/16/12    monosodium urate crystals; Dr Caralyn Guile    Social History   Socioeconomic History  . Marital status: Married    Spouse name: None  . Number of children: None  . Years of education: None  . Highest education level: None  Social Needs  . Financial resource strain: None  . Food insecurity - worry: None  . Food insecurity - inability: None  . Transportation needs - medical: None  . Transportation needs - non-medical: None  Occupational History  . None  Tobacco Use  . Smoking status: Former Smoker    Packs/day: 0.25    Years: 1.00    Pack years: 0.25    Types: Cigarettes    Last attempt to quit: 05/15/1968    Years since quitting: 48.9  . Smokeless tobacco: Never Used  . Tobacco comment: smoked [place], up to 1-2 cigarettes/ day  Substance and Sexual Activity  . Alcohol use: No    Alcohol/week: 0.0 oz  . Drug use: No  . Sexual activity: Yes  Other Topics Concern  . None  Social History Narrative   Walks three times a week, 2-3 miles, occasional walks on treadmill        Family History  Problem Relation Age of Onset  . Hypertension Father   . Prostate cancer Father   . Diabetes Father   . Cancer Father   . Hypertension Mother   . Diabetes Mother   . Stroke Neg Hx   . Heart disease Neg Hx   . Colon cancer Neg Hx     Review of Systems  Constitutional: Negative for chills and fever.  Respiratory: Positive for cough (occasional) and wheezing (rare). Negative for shortness of breath.   Cardiovascular: Negative for chest pain, palpitations and leg swelling.  Neurological: Positive for headaches (occ). Negative for light-headedness and numbness.       Objective:   Vitals:   04/13/17 1331  BP: 124/82  Pulse: (!) 54  Resp: 16  Temp: 97.9 F (36.6 C)  SpO2: 97%   Wt Readings from Last 3 Encounters:  04/13/17 190 lb (86.2 kg)  01/23/17 195 lb  (88.5 kg)  09/06/16 196 lb (88.9 kg)   Body mass index is 30.67 kg/m.   Physical Exam    Constitutional: Appears well-developed and well-nourished. No distress.  HENT:  Head: Normocephalic and atraumatic.  Neck: Neck supple. No tracheal deviation present. No thyromegaly present.  No cervical lymphadenopathy Cardiovascular: Normal rate, regular rhythm and normal heart sounds.   No murmur heard. No carotid bruit .  1 + pitting b/l le edema Pulmonary/Chest: Effort normal and breath sounds normal. No respiratory distress. No has no wheezes. No rales.  Abd: soft, non tender, reducible lower ventral hernia that is non tender Skin: Skin is warm and dry. Not diaphoretic.  Psychiatric: Normal mood and affect. Behavior is normal.      Assessment & Plan:    See Problem List for Assessment and Plan of chronic medical problems.

## 2017-04-12 NOTE — Telephone Encounter (Signed)
LM for the pt to call back to schedule

## 2017-04-13 ENCOUNTER — Other Ambulatory Visit (INDEPENDENT_AMBULATORY_CARE_PROVIDER_SITE_OTHER): Payer: Medicare PPO

## 2017-04-13 ENCOUNTER — Ambulatory Visit: Payer: Medicare PPO | Admitting: Internal Medicine

## 2017-04-13 ENCOUNTER — Encounter: Payer: Self-pay | Admitting: Internal Medicine

## 2017-04-13 VITALS — BP 124/82 | HR 54 | Temp 97.9°F | Resp 16 | Wt 190.0 lb

## 2017-04-13 DIAGNOSIS — E1122 Type 2 diabetes mellitus with diabetic chronic kidney disease: Secondary | ICD-10-CM | POA: Diagnosis not present

## 2017-04-13 DIAGNOSIS — Z794 Long term (current) use of insulin: Secondary | ICD-10-CM

## 2017-04-13 DIAGNOSIS — K439 Ventral hernia without obstruction or gangrene: Secondary | ICD-10-CM

## 2017-04-13 DIAGNOSIS — N189 Chronic kidney disease, unspecified: Secondary | ICD-10-CM

## 2017-04-13 DIAGNOSIS — M1A9XX0 Chronic gout, unspecified, without tophus (tophi): Secondary | ICD-10-CM

## 2017-04-13 DIAGNOSIS — I10 Essential (primary) hypertension: Secondary | ICD-10-CM

## 2017-04-13 LAB — COMPREHENSIVE METABOLIC PANEL
ALBUMIN: 4.1 g/dL (ref 3.5–5.2)
ALT: 29 U/L (ref 0–53)
AST: 35 U/L (ref 0–37)
Alkaline Phosphatase: 56 U/L (ref 39–117)
BUN: 29 mg/dL — AB (ref 6–23)
CALCIUM: 10.2 mg/dL (ref 8.4–10.5)
CHLORIDE: 108 meq/L (ref 96–112)
CO2: 25 mEq/L (ref 19–32)
Creatinine, Ser: 1.56 mg/dL — ABNORMAL HIGH (ref 0.40–1.50)
GFR: 57.04 mL/min — ABNORMAL LOW (ref >60.00)
Glucose, Bld: 116 mg/dL — ABNORMAL HIGH (ref 70–99)
POTASSIUM: 4.1 meq/L (ref 3.5–5.1)
SODIUM: 140 meq/L (ref 135–145)
Total Bilirubin: 0.6 mg/dL (ref 0.2–1.2)
Total Protein: 6.9 g/dL (ref 6.0–8.3)

## 2017-04-13 LAB — CBC WITH DIFFERENTIAL/PLATELET
BASOS PCT: 0.9 % (ref 0.0–3.0)
Basophils Absolute: 0 10*3/uL (ref 0.0–0.1)
EOS PCT: 2.2 % (ref 0.0–5.0)
Eosinophils Absolute: 0.1 10*3/uL (ref 0.0–0.7)
HEMATOCRIT: 36.2 % — AB (ref 39.0–52.0)
HEMOGLOBIN: 12 g/dL — AB (ref 13.0–17.0)
LYMPHS PCT: 28.2 % (ref 12.0–46.0)
Lymphs Abs: 1.2 10*3/uL (ref 0.7–4.0)
MCHC: 33 g/dL (ref 30.0–36.0)
MCV: 80.4 fl (ref 78.0–100.0)
MONOS PCT: 9 % (ref 3.0–12.0)
Monocytes Absolute: 0.4 10*3/uL (ref 0.1–1.0)
Neutro Abs: 2.5 10*3/uL (ref 1.4–7.7)
Neutrophils Relative %: 59.7 % (ref 43.0–77.0)
Platelets: 213 10*3/uL (ref 150.0–400.0)
RBC: 4.51 Mil/uL (ref 4.22–5.81)
RDW: 14.1 % (ref 11.5–15.5)
WBC: 4.2 10*3/uL (ref 4.0–10.5)

## 2017-04-13 LAB — HEMOGLOBIN A1C: Hgb A1c MFr Bld: 6.8 % — ABNORMAL HIGH (ref 4.6–6.5)

## 2017-04-13 LAB — LIPID PANEL
CHOL/HDL RATIO: 3
Cholesterol: 128 mg/dL (ref 0–200)
HDL: 49.6 mg/dL (ref >39.00)
LDL CALC: 68 mg/dL (ref 0–99)
NonHDL: 78.07
TRIGLYCERIDES: 49 mg/dL (ref 0.0–149.0)
VLDL: 9.8 mg/dL (ref 0.0–40.0)

## 2017-04-13 NOTE — Assessment & Plan Note (Signed)
BP well controlled Current regimen effective and well tolerated Continue current medications at current doses cmp  

## 2017-04-13 NOTE — Assessment & Plan Note (Signed)
Check a1c Low sugar / carb diet Stressed regular exercise   

## 2017-04-13 NOTE — Assessment & Plan Note (Signed)
cmp

## 2017-04-13 NOTE — Assessment & Plan Note (Addendum)
No symptoms since last visit Taking colchicine daily continue

## 2017-04-13 NOTE — Assessment & Plan Note (Signed)
having pain Refer to surgery

## 2017-04-14 ENCOUNTER — Encounter: Payer: Self-pay | Admitting: Internal Medicine

## 2017-05-10 ENCOUNTER — Other Ambulatory Visit: Payer: Self-pay | Admitting: Emergency Medicine

## 2017-05-10 MED ORDER — GLIMEPIRIDE 1 MG PO TABS: ORAL_TABLET | ORAL | 1 refills | Status: DC

## 2017-05-16 ENCOUNTER — Ambulatory Visit: Payer: Self-pay | Admitting: Surgery

## 2017-05-16 DIAGNOSIS — K439 Ventral hernia without obstruction or gangrene: Secondary | ICD-10-CM | POA: Diagnosis not present

## 2017-05-16 DIAGNOSIS — M6208 Separation of muscle (nontraumatic), other site: Secondary | ICD-10-CM | POA: Diagnosis not present

## 2017-05-16 NOTE — H&P (Signed)
Corey [name] Schouten Documented: 05/16/2017 9:04 AM Location: [facility] Patient #: [id number] DOB: 03-03-1946 Married / Language: English / Race: Black or African American Male  History of Present Illness (Shenika Quint A. Kae Heller MD; 05/16/2017 9:16 AM) Patient words: Very nice 72 year old man who is referred for abdominal wall hernia. It has been there for years. He states that it will poke out until push it back in but he has not had any episodes of incarceration. He does not think it has changed in size significantly since he first noticed it. It does cause him some discomfort. He denies any prior abdominal surgical history aside from a gastrostomy tube in 2014 which has since been removed. This was at the time of the stroke.  The patient is a 72 year old male.   Allergies Benjiman Core, [place]; 05/16/2017 9:08 AM) HYDROCHLOROTHIAZIDE ALLOPURINOL  Medication History Benjiman Core, CMA; 05/16/2017 9:09 AM) Norvasc (10MG  Tablet, Oral) Active. Aspirin (81MG  Tablet, Oral) Active. CloNIDine HCl (0.3MG  Tablet, Oral) Active. Colcrys (0.6MG  Tablet, Oral) Active. Glimepiride (1MG  Tablet, Oral) Active. HydrALAZINE HCl (50MG  Tablet, Oral) Active. Medications Reconciled     Review of Systems (Armen Glenn CMA; 05/16/2017 9:04 AM) General Not Present- Appetite Loss, Chills, Fatigue, Fever, Night Sweats, Weight Gain and Weight Loss. Skin Not Present- Change in Wart/Mole, Dryness, Hives, Jaundice, New Lesions, Non-Healing Wounds, Rash and Ulcer. HEENT Present- Hearing Loss. Not Present- Earache, Hoarseness, Nose Bleed, Oral Ulcers, Ringing in the Ears, Seasonal Allergies, Sinus Pain, Sore Throat, Visual Disturbances, Wears glasses/contact lenses and Yellow Eyes. Respiratory Not Present- Bloody sputum, Chronic Cough, Difficulty Breathing, Snoring and Wheezing. Breast Not Present- Breast Mass, Breast Pain, Nipple Discharge and Skin Changes. Cardiovascular Not Present- Chest Pain, Difficulty  Breathing Lying Down, Leg Cramps, Palpitations, Rapid Heart Rate, Shortness of Breath and Swelling of Extremities. Gastrointestinal Not Present- Abdominal Pain, Bloating, Bloody Stool, Change in Bowel Habits, Chronic diarrhea, Constipation, Difficulty Swallowing, Excessive gas, Gets full quickly at meals, Hemorrhoids, Indigestion, Nausea, Rectal Pain and Vomiting. Male Genitourinary Present- Urgency and Urine Leakage. Not Present- Blood in Urine, Change in Urinary Stream, Frequency, Impotence, Nocturia and Painful Urination. Musculoskeletal Not Present- Back Pain, Joint Pain, Joint Stiffness, Muscle Pain, Muscle Weakness and Swelling of Extremities. Neurological Not Present- Decreased Memory, Fainting, Headaches, Numbness, Seizures, Tingling, Tremor, Trouble walking and Weakness. Psychiatric Not Present- Anxiety, Bipolar, Change in Sleep Pattern, Depression, Fearful and Frequent crying. Endocrine Not Present- Cold Intolerance, Excessive Hunger, Hair Changes, Heat Intolerance, Hot flashes and New Diabetes. Hematology Not Present- Blood Thinners, Easy Bruising, Excessive bleeding, Gland problems, HIV and Persistent Infections.  Vitals (Armen Glenn CMA; 05/16/2017 9:07 AM) 05/16/2017 9:07 AM Weight: 196.5 lb Height: 66in Body Surface Area: 1.99 m Body Mass Index: 31.72 kg/m  Temp.: 97.59F  Pulse: 64 (Regular)  P.OX: 98% (Room air) BP: 160/84 (Sitting, Left Arm, Standard)      Physical Exam (Trestin Vences A. Kae Heller MD; 05/16/2017 9:17 AM)  General Note: alert and well appearing  Integumentary Note: warm and dry  Head and Neck Note: no mass or thyromegaly  Eye Note: No scleral icterus. Extra ocular motions intact.  ENMT Note: Moist mucous membranes, dentition intact  Chest and Lung Exam Note: Unlabored respirations, clear bilaterally  Cardiovascular Note: Regular rate and rhythm, no pedal edema  Abdomen Note: Soft, nontender nondistended. There is a midline diastases  approximately 8 cm in width. There is a well-healed upper midline scar from prior gastrostomy tube. Several centimeters inferior to this there is a 2 cm fascial defect with  reducible contents. Some mild overlying skin discoloration.  Neurologic Note: Grossly intact, normal gait  Neuropsychiatric Note: Normal mood and affect. Appropriate insight.  Musculoskeletal Note: Strength symmetrical throughout, no deformity    Assessment & Plan (Dabney Dever A. Evelena Masci MD; 05/16/2017 9:19 AM)  DIASTASIS RECTI (M62.08)   VENTRAL HERNIA (K43.9) Story: Approximately 2 cm defect within moderate diastases, several centimeters inferior to prior gastrostomy tube site. We discussed options of endoscopic versus open repair. Discussed risks of surgery including bleeding, infection, pain, scarring, intra-abdominal injury, hernia recurrence, as well as general risks of stroke, heart attack, pneumonia, blood clots and death. Discussed risks of no surgery including increasing size and symptoms of the hernia, low risk of incarceration/strangulation and need for emergency surgery. After discussion he desires to proceed with hernia repair. We'll attempt laparoscopic repair with mesh. He will need cardiac clearance.

## 2017-05-17 ENCOUNTER — Ambulatory Visit: Payer: Medicare PPO | Admitting: Endocrinology

## 2017-05-24 ENCOUNTER — Ambulatory Visit: Payer: Medicare HMO | Admitting: Cardiology

## 2017-05-24 ENCOUNTER — Encounter: Payer: Self-pay | Admitting: Cardiology

## 2017-05-24 VITALS — BP 136/80 | HR 52 | Ht 66.0 in | Wt 199.0 lb

## 2017-05-24 DIAGNOSIS — Z01810 Encounter for preprocedural cardiovascular examination: Secondary | ICD-10-CM

## 2017-05-24 DIAGNOSIS — I10 Essential (primary) hypertension: Secondary | ICD-10-CM | POA: Diagnosis not present

## 2017-05-24 DIAGNOSIS — R9431 Abnormal electrocardiogram [ECG] [EKG]: Secondary | ICD-10-CM | POA: Diagnosis not present

## 2017-05-24 NOTE — Patient Instructions (Signed)
Medication Instructions:  The current medical regimen is effective;  continue present plan and medications.  Testing/Procedures: Your physician has requested that you have an echocardiogram. Echocardiography is a painless test that uses sound waves to create images of your heart. It provides your doctor with information about the size and shape of your heart and how well your heart's chambers and valves are working. This procedure takes approximately one hour. There are no restrictions for this procedure.  Follow-Up: Follow up in 1 year with Dr. Skains.  You will receive a letter in the mail 2 months before you are due.  Please call us when you receive this letter to schedule your follow up appointment.  If you need a refill on your cardiac medications before your next appointment, please call your pharmacy.  Thank you for choosing [facility]!!     

## 2017-05-24 NOTE — Progress Notes (Addendum)
Cardiology Office Note:    Date:  05/24/2017   ID:  Corey [name], DOB [birth year], MRN [id number]  PCP:  [name], MD  Cardiologist:  No primary care provider on file.   Referring MD: [name], MD     History of Present Illness:    Corey [name] is a 72 y.o. male with severe left ventricular hypertrophy, normal EF, prior 1 month hospital stay in 2014 after suffering a PEA arrest with infection, hypertensive urgency here for preoperative risk stratification prior to hernia repair.  He saw Dr. Johnsie Cancel last in 2014  Overall he has not been experiencing any anginal symptoms, no shortness of breath, he is able to traverse 1-2 flights of stairs without difficulty.  Obviously 2014 was a extreme experience for him.  He has not had any cardiology follow-up since then.  He does not have any unexplained syncopal episodes, no tachypalpitations.  Hypertrophic changes noted on his prior ECG as well as echocardiogram.    Past Medical History:  Diagnosis Date  . Cataract   . Colon polyp 2003   Dr [name]; F/U was to be 2008( not completed)  . CVA (cerebral infarction) 2014  . Diabetes mellitus   . Diverticulosis 2003  . Gout   . Hypertension   . Myocardial infarction ([place]) 2014  . Pneumonia 09/29/11   Avelox X 10 days as OP  . Prostate cancer ([place])   . Renal insufficiency   . Seizures ([place])    not treated for seizure disorder; had a seizure after stroke 2014; no seizure since then    Past Surgical History:  Procedure Laterality Date  . COLONOSCOPY W/ POLYPECTOMY  2003   no F/U ([name] discussed 12/03/12)  . PEG PLACEMENT  2014  . PEG TUBE REMOVAL  2014  . PROSTATE BIOPSY    . REFRACTIVE SURGERY  2005  . TRACHEOSTOMY  2014   was closed after hospital stay  . wrist aspiration  02/16/12    monosodium urate crystals; Dr Caralyn Guile    Current Medications: Current Meds  Medication Sig  . amLODipine (NORVASC) 10 MG tablet Take 1 tablet (10 mg total) by mouth daily.  Marland Kitchen  aspirin EC 81 MG tablet Take 1 tablet (81 mg total) by mouth daily.  . cloNIDine (CATAPRES) 0.3 MG tablet Take 1 tablet (0.3 mg total) by mouth 2 (two) times daily.  . colchicine (COLCRYS) 0.6 MG tablet Take 1 tablet (0.6 mg total) by mouth daily.  . diazepam (VALIUM) 10 MG tablet   . glimepiride (AMARYL) 1 MG tablet TAKE 1 TABLET DAILY BEFORE SUPPER.  Marland Kitchen glucose blood (ACCU-CHEK AVIVA PLUS) test strip Use as instructed to check blood sugars 2 times per day dx code E11.29  . hydrALAZINE (APRESOLINE) 50 MG tablet Take 1 tablet (50 mg total) by mouth 3 (three) times daily.  . isosorbide mononitrate (IMDUR) 60 MG 24 hr tablet Take 1 tablet (60 mg total) by mouth 2 (two) times daily.  [name] Devices (ACCU-CHEK SOFTCLIX) lancets Use to test blood sugar 2 times daily- Dx code E11.29  . losartan (COZAAR) 100 MG tablet Take 1 tablet (100 mg total) by mouth daily.  . metFORMIN (GLUCOPHAGE-XR) 750 MG 24 hr tablet Take 1 tablet (750 mg total) by mouth daily with supper.  . metoprolol succinate (TOPROL-XL) 100 MG 24 hr tablet Take 1 tablet (100 mg total) by mouth 2 (two) times daily. Yearly physical due in Sept  . silodosin (RAPAFLO) 4 MG CAPS  capsule Take 1 capsule (4 mg total) by mouth at bedtime.     Allergies:   Hydrochlorothiazide and Allopurinol   Social History   Socioeconomic History  . Marital status: Married    Spouse name: None  . Number of children: None  . Years of education: None  . Highest education level: None  Social Needs  . Financial resource strain: None  . Food insecurity - worry: None  . Food insecurity - inability: None  . Transportation needs - medical: None  . Transportation needs - non-medical: None  Occupational History  . None  Tobacco Use  . Smoking status: Former Smoker    Packs/day: 0.25    Years: 1.00    Pack years: 0.25    Types: Cigarettes    Last attempt to quit: 05/15/1968    Years since quitting: 49.0  . Smokeless tobacco: Never Used  . Tobacco  comment: smoked [place], up to 1-2 cigarettes/ day  Substance and Sexual Activity  . Alcohol use: No    Alcohol/week: 0.0 oz  . Drug use: No  . Sexual activity: Yes  Other Topics Concern  . None  Social History Narrative   Walks three times a week, 2-3 miles, occasional walks on treadmill         Family History: The patient's family history includes Cancer in his father; Diabetes in his father and mother; Hypertension in his father and mother; Prostate cancer in his father. There is no history of Stroke, Heart disease, or Colon cancer.  ROS:   Please see the history of present illness.     All other systems reviewed and are negative.  EKGs/Labs/Other Studies Reviewed:    The following studies were reviewed today: Prior hospital records, EKG, echocardiogram reviewed  Echo 09/19/12-severe LVH, normal EF  EKG:  EKG is  ordered today.  Today's ECG shows sinus bradycardia, first-degree AV block with PACs.  Left axis deviation noted as well as poor R wave progression and nonspecific ST-T wave changes.  Personally viewed  Recent Labs: 01/23/2017: TSH 1.13 04/13/2017: ALT 29; BUN 29; Creatinine, Ser 1.56; Hemoglobin 12.0; Platelets 213.0; Potassium 4.1; Sodium 140  Recent Lipid Panel    Component Value Date/Time   CHOL 128 04/13/2017 1409   TRIG 49.0 04/13/2017 1409   HDL 49.60 04/13/2017 1409   CHOLHDL 3 04/13/2017 1409   VLDL 9.8 04/13/2017 1409   LDLCALC 68 04/13/2017 1409    Physical Exam:    VS:  BP 136/80   Pulse (!) 52   Ht 5\' 6"  (1.676 m)   Wt 199 lb (90.3 kg)   SpO2 98%   BMI 32.12 kg/m     Wt Readings from Last 3 Encounters:  05/24/17 199 lb (90.3 kg)  04/13/17 190 lb (86.2 kg)  01/23/17 195 lb (88.5 kg)     GEN:  Well nourished, well developed in no acute distress HEENT: Normal NECK: No JVD; No carotid bruits LYMPHATICS: No lymphadenopathy CARDIAC: RRR, no murmurs, rubs, gallops RESPIRATORY:  Clear to auscultation without rales, wheezing or rhonchi    ABDOMEN: Soft, non-tender, non-distended MUSCULOSKELETAL:  No edema; No deformity  SKIN: Warm and dry NEUROLOGIC:  Alert and oriented x 3 PSYCHIATRIC:  Normal affect   ASSESSMENT:    1. Pre-operative cardiovascular examination   2. Essential hypertension   3. Abnormal EKG    PLAN:    In order of problems listed above:  Preoperative cardiovascular risk stratification - I will check an echocardiogram to ensure  that there is been no significant changes.  Prior echo 5 years ago showed severe left ventricular hypertrophy and normal ejection fraction.  Today's ECG shows sinus bradycardia, first-degree AV block with PACs.  Left axis deviation noted as well as poor R wave progression and nonspecific ST-T wave changes. -He is not having any chest discomfort, he is able to complete greater than 4 METS of activity without anginal symptoms.  I think that if echocardiogram is stable, he may proceed with surgery with low overall cardiac risk.  Hypertensive heart disease without heart failure -Continue with aggressive blood pressure control.  Excellently controlled today.  No dizziness.  Multidrug regimen reviewed.  Abnormal EKG - This is been abnormal for several years.  Note mild first-degree AV block.  He is on Toprol.  No syncopal episodes.  Continue.  Given his hypertrophic changes, I think would be wise for him to come back in 1 year.  Medication Adjustments/Labs and Tests Ordered: Current medicines are reviewed at length with the patient today.  Concerns regarding medicines are outlined above.  Orders Placed This Encounter  Procedures  . EKG 12-Lead  . ECHOCARDIOGRAM COMPLETE   No orders of the defined types were placed in this encounter.   Signed, Candee Furbish, MD  05/24/2017 12:13 PM    [facility]  Addendum 06/20/17:  Echocardiogram continues to demonstrate severe left ventricular hypertrophy, normal ejection fraction. No significant changes when compared  to prior study approximally 5 years ago. In fact, pericardial effusion remains. No evidence of tamponade. He may proceed with surgery. Continue to adequately treat hypertension.  Candee Furbish, MD

## 2017-05-30 ENCOUNTER — Other Ambulatory Visit: Payer: Self-pay

## 2017-05-30 ENCOUNTER — Ambulatory Visit (HOSPITAL_COMMUNITY): Payer: Medicare HMO | Attending: Internal Medicine

## 2017-05-30 DIAGNOSIS — I5030 Unspecified diastolic (congestive) heart failure: Secondary | ICD-10-CM | POA: Insufficient documentation

## 2017-05-30 DIAGNOSIS — I420 Dilated cardiomyopathy: Secondary | ICD-10-CM | POA: Diagnosis not present

## 2017-05-30 DIAGNOSIS — I313 Pericardial effusion (noninflammatory): Secondary | ICD-10-CM | POA: Diagnosis not present

## 2017-05-30 DIAGNOSIS — R9431 Abnormal electrocardiogram [ECG] [EKG]: Secondary | ICD-10-CM | POA: Diagnosis not present

## 2017-05-30 DIAGNOSIS — I0989 Other specified rheumatic heart diseases: Secondary | ICD-10-CM | POA: Insufficient documentation

## 2017-05-30 DIAGNOSIS — I10 Essential (primary) hypertension: Secondary | ICD-10-CM

## 2017-06-20 ENCOUNTER — Telehealth: Payer: Self-pay | Admitting: Cardiology

## 2017-06-20 NOTE — Telephone Encounter (Signed)
He may proceed with surgery.  See office note.   Candee Furbish, MD

## 2017-06-20 NOTE — Telephone Encounter (Signed)
New message       [facility] Pre-operative Risk Assessment    Request for surgical clearance:  1. What type of surgery is being performed?Hernia Repair  2. When is this surgery scheduled? TBD  3. What type of clearance is required (medical clearance vs. Pharmacy clearance to hold med vs. Both)? Both  4. Are there any medications that need to be held prior to surgery and how long? n/a  5. Practice name and name of physician performing surgery? Dr Romana Juniper  6. What is your office phone and fax number? Fax [phone]  Anesthesia type (None, local, MAC, general) ? [place] 06/20/2017, 3:44 PM  _________________________________________________________________   (provider comments below)

## 2017-06-20 NOTE — Telephone Encounter (Signed)
Dr. Marlou Porch, can you please addend your note dated 05/24/2017 to include preoperative clearance for this patient? It appears his echo has been completed.  Please respond back to P CV DIV PREOP pool and we will fax the requesting surgeon.

## 2017-06-20 NOTE — Telephone Encounter (Signed)
   Primary Cardiologist: Candee Furbish, MD  Chart reviewed as part of pre-operative protocol coverage. Patient was contacted 06/20/2017 in reference to pre-operative risk assessment for pending surgery as outlined below.  Corey [name] was last seen on 05/24/17 by Dr. Marlou Porch.  Since that day, Corey [name] has done well.  Per Dr. Kingsley Plan note, he is cleared for surgery.  Therefore, based on ACC/AHA guidelines, the patient would be at acceptable risk for the planned procedure without further cardiovascular testing.   I will route this recommendation to the requesting party via Epic fax function and remove from pre-op pool.  Preop call back pool, please call office and make sure they received this documentation.  Please call with questions.  [place], PA 06/20/2017, 4:09 PM

## 2017-06-21 NOTE — Telephone Encounter (Signed)
Called [facility] and confirmed receipt of clearance

## 2017-06-25 ENCOUNTER — Other Ambulatory Visit: Payer: Self-pay | Admitting: Internal Medicine

## 2017-06-25 ENCOUNTER — Other Ambulatory Visit: Payer: Self-pay | Admitting: Endocrinology

## 2017-07-02 ENCOUNTER — Telehealth: Payer: Self-pay | Admitting: Endocrinology

## 2017-07-02 MED ORDER — METFORMIN HCL ER 750 MG PO TB24: 750.0000 mg | ORAL_TABLET | Freq: Every day | ORAL | 0 refills | Status: DC

## 2017-07-02 NOTE — Telephone Encounter (Signed)
May refill until next visit

## 2017-07-02 NOTE — Telephone Encounter (Signed)
Refill was sent today.

## 2017-07-02 NOTE — Telephone Encounter (Signed)
Patient need a refill of medication metformin  Send to  [facility] [place] [facility] [place] [phone] (Phone) [phone] (Fax)

## 2017-07-09 NOTE — Pre-Procedure Instructions (Signed)
Corey [name]  07/09/2017      [facility] ([facility]), [place] - [place] [address] [place] ([place]) [place] [address] Phone: [phone] Fax: [phone]    Your procedure is scheduled on March 4  Report to [facility] Admitting at 0800 A.M.  Call this number if you have problems the morning of surgery:  [phone]   Remember:  Do not eat food or drink liquids after midnight.  Continue all medications as directed by your physician except follow these medication instructions before surgery below   Take these medicines the morning of surgery with A SIP OF WATER  amLODipine (NORVASC) cloNIDine (CATAPRES) hydrALAZINE (APRESOLINE) isosorbide mononitrate (IMDUR) metoprolol succinate (TOPROL-XL)  Continue all medications as directed by your physician except follow these medication instructions before surgery below  Follow your doctors instructions regarding your Aspirin.  If no instructions were given by your doctor, then you will need to call the prescribing office office to get instructions.    WHAT DO I DO ABOUT MY DIABETES MEDICATION?   Marland Kitchen Do not take oral diabetes medicines (pills) the morning of surgery. metFORMIN (GLUCOPHAGE-XR) and glimepiride (AMARYL   How to Manage Your Diabetes Before and After Surgery  Why is it important to control my blood sugar before and after surgery? . Improving blood sugar levels before and after surgery helps healing and can limit problems. . A way of improving blood sugar control is eating a healthy diet by: o  Eating less sugar and carbohydrates o  Increasing activity/exercise o  Talking with your doctor about reaching your blood sugar goals . High blood sugars (greater than 180 mg/dL) can raise your risk of infections and slow your recovery, so you will need to focus on controlling your diabetes during the weeks before surgery. . Make sure that the doctor who takes care of your diabetes knows  about your planned surgery including the date and location.  How do I manage my blood sugar before surgery? . Check your blood sugar at least 4 times a day, starting 2 days before surgery, to make sure that the level is not too high or low. o Check your blood sugar the morning of your surgery when you wake up and every 2 hours until you get to the Short Stay unit. . If your blood sugar is less than 70 mg/dL, you will need to treat for low blood sugar: o Do not take insulin. o Treat a low blood sugar (less than 70 mg/dL) with  cup of clear juice (cranberry or apple), 4 glucose tablets, OR glucose gel. o Recheck blood sugar in 15 minutes after treatment (to make sure it is greater than 70 mg/dL). If your blood sugar is not greater than 70 mg/dL on recheck, call [phone] for further instructions. . Report your blood sugar to the short stay nurse when you get to Short Stay.  . If you are admitted to the hospital after surgery: o Your blood sugar will be checked by the staff and you will probably be given insulin after surgery (instead of oral diabetes medicines) to make sure you have good blood sugar levels. o The goal for blood sugar control after surgery is 80-180 mg/dL     Do not wear jewelry  Do not wear lotions, powders, or cologne, or deodorant.  Men may shave face and neck.  Do not bring valuables to the hospital.  [facility] is not responsible for any belongings  or valuables.  Contacts, dentures or bridgework may not be worn into surgery.  Leave your suitcase in the car.  After surgery it may be brought to your room.  For patients admitted to the hospital, discharge time will be determined by your treatment team.  Patients discharged the day of surgery will not be allowed to drive home.    Special instructions:   [place]- Preparing For Surgery  Before surgery, you can play an important role. Because skin is not sterile, your skin needs to be as free of germs as  possible. You can reduce the number of germs on your skin by washing with CHG (chlorahexidine gluconate) Soap before surgery.  CHG is an antiseptic cleaner which kills germs and bonds with the skin to continue killing germs even after washing.  Please do not use if you have an allergy to CHG or antibacterial soaps. If your skin becomes reddened/irritated stop using the CHG.  Do not shave (including legs and underarms) for at least 48 hours prior to first CHG shower. It is OK to shave your face.  Please follow these instructions carefully.   1. Shower the NIGHT BEFORE SURGERY and the MORNING OF SURGERY with CHG.   2. If you chose to wash your hair, wash your hair first as usual with your normal shampoo.  3. After you shampoo, rinse your hair and body thoroughly to remove the shampoo.  4. Use CHG as you would any other liquid soap. You can apply CHG directly to the skin and wash gently with a scrungie or a clean washcloth.   5. Apply the CHG Soap to your body ONLY FROM THE NECK DOWN.  Do not use on open wounds or open sores. Avoid contact with your eyes, ears, mouth and genitals (private parts). Wash Face and genitals (private parts)  with your normal soap.  6. Wash thoroughly, paying special attention to the area where your surgery will be performed.  7. Thoroughly rinse your body with warm water from the neck down.  8. DO NOT shower/wash with your normal soap after using and rinsing off the CHG Soap.  9. Pat yourself dry with a CLEAN TOWEL.  10. Wear CLEAN PAJAMAS to bed the night before surgery, wear comfortable clothes the morning of surgery  11. Place CLEAN SHEETS on your bed the night of your first shower and DO NOT SLEEP WITH PETS.    Day of Surgery: Do not apply any deodorants/lotions. Please wear clean clothes to the hospital/surgery center.      Please read over the following fact sheets that you were given.

## 2017-07-10 ENCOUNTER — Encounter (HOSPITAL_COMMUNITY)
Admission: RE | Admit: 2017-07-10 | Discharge: 2017-07-10 | Disposition: A | Discharge status: Home / Self Care | Payer: Medicare HMO | Source: Ambulatory Visit | Attending: Surgery | Admitting: Surgery

## 2017-07-10 ENCOUNTER — Other Ambulatory Visit: Payer: Self-pay

## 2017-07-10 ENCOUNTER — Encounter (HOSPITAL_COMMUNITY): Payer: Self-pay

## 2017-07-10 ENCOUNTER — Ambulatory Visit (HOSPITAL_COMMUNITY)
Admission: RE | Admit: 2017-07-10 | Discharge: 2017-07-10 | Disposition: A | Discharge status: Home / Self Care | Payer: Medicare HMO | Source: Ambulatory Visit | Attending: Surgery | Admitting: Surgery

## 2017-07-10 DIAGNOSIS — Z01812 Encounter for preprocedural laboratory examination: Secondary | ICD-10-CM | POA: Diagnosis not present

## 2017-07-10 DIAGNOSIS — Z01810 Encounter for preprocedural cardiovascular examination: Secondary | ICD-10-CM | POA: Insufficient documentation

## 2017-07-10 DIAGNOSIS — I517 Cardiomegaly: Secondary | ICD-10-CM | POA: Insufficient documentation

## 2017-07-10 DIAGNOSIS — Z01818 Encounter for other preprocedural examination: Secondary | ICD-10-CM | POA: Diagnosis not present

## 2017-07-10 DIAGNOSIS — J984 Other disorders of lung: Secondary | ICD-10-CM | POA: Diagnosis not present

## 2017-07-10 HISTORY — DX: Cerebral infarction, unspecified: I63.9

## 2017-07-10 HISTORY — DX: Dyspnea, unspecified: R06.00

## 2017-07-10 HISTORY — DX: Frequency of micturition: R35.0

## 2017-07-10 LAB — CBC WITH DIFFERENTIAL/PLATELET
BASOS ABS: 0 10*3/uL (ref 0.0–0.1)
Basophils Relative: 0 %
EOS PCT: 3 %
Eosinophils Absolute: 0.1 10*3/uL (ref 0.0–0.7)
HCT: 33.9 % — ABNORMAL LOW (ref 39.0–52.0)
HEMOGLOBIN: 11 g/dL — AB (ref 13.0–17.0)
LYMPHS PCT: 33 %
Lymphs Abs: 1.1 10*3/uL (ref 0.7–4.0)
MCH: 27 pg (ref 26.0–34.0)
MCHC: 32.4 g/dL (ref 30.0–36.0)
MCV: 83.1 fL (ref 78.0–100.0)
Monocytes Absolute: 0.2 10*3/uL (ref 0.1–1.0)
Monocytes Relative: 7 %
NEUTROS ABS: 1.9 10*3/uL (ref 1.7–7.7)
NEUTROS PCT: 57 %
PLATELETS: 193 10*3/uL (ref 150–400)
RBC: 4.08 MIL/uL — AB (ref 4.22–5.81)
RDW: 13.9 % (ref 11.5–15.5)
WBC: 3.3 10*3/uL — AB (ref 4.0–10.5)

## 2017-07-10 LAB — GLUCOSE, CAPILLARY: Glucose-Capillary: 111 mg/dL — ABNORMAL HIGH (ref 65–99)

## 2017-07-10 LAB — BASIC METABOLIC PANEL
ANION GAP: 8 (ref 5–15)
BUN: 25 mg/dL — ABNORMAL HIGH (ref 6–20)
CO2: 21 mmol/L — ABNORMAL LOW (ref 22–32)
Calcium: 10.1 mg/dL (ref 8.9–10.3)
Chloride: 113 mmol/L — ABNORMAL HIGH (ref 101–111)
Creatinine, Ser: 1.45 mg/dL — ABNORMAL HIGH (ref 0.61–1.24)
GFR calc non Af Amer: 48 mL/min — ABNORMAL LOW (ref >60)
GFR, EST AFRICAN AMERICAN: 55 mL/min — AB (ref >60)
Glucose, Bld: 101 mg/dL — ABNORMAL HIGH (ref 65–99)
POTASSIUM: 4.6 mmol/L (ref 3.5–5.1)
SODIUM: 142 mmol/L (ref 135–145)

## 2017-07-10 NOTE — Pre-Procedure Instructions (Signed)
Corey [name]  07/10/2017      [facility] ([facility]), [place] - [place] [address] [place] ([place]) [place] [address] Phone: [phone] Fax: [phone]    Your procedure is scheduled on 07-16-2017 Monday   Report to [facility] Admitting at 8:00 A.M .  Call this number if you have problems the morning of surgery:  [phone]   Remember:  Do not eat food or drink liquids after midnight.   Take these medicines the morning of surgery with A SIP OF WATER  Amlodipine(Norvasc) Clonidine(Catapres) Hydralazine(Apresoline) Isosorbide(Imdur) Metoprolol(Toprol-XL)  STOP TAKING ANY ASPIRIN(UNLESS OTHERWISE INSTRUCTED BY YOUR SURGEON),ANTIINFLAMATORIES (IBUPROFEN,ALEVE,MOTRIN,ADVIL,GOODY'S POWDERS),HERBAL SUPPLEMENTS,FISH OIL,AND VITAMINS 5-7 DAYS PRIOR TO SURGERY      How to Manage Your Diabetes Before and After Surgery  Why is it important to control my blood sugar before and after surgery? . Improving blood sugar levels before and after surgery helps healing and can limit problems. . A way of improving blood sugar control is eating a healthy diet by: o  Eating less sugar and carbohydrates o  Increasing activity/exercise o  Talking with your doctor about reaching your blood sugar goals . High blood sugars (greater than 180 mg/dL) can raise your risk of infections and slow your recovery, so you will need to focus on controlling your diabetes during the weeks before surgery. . Make sure that the doctor who takes care of your diabetes knows about your planned surgery including the date and location.  How do I manage my blood sugar before surgery? . Check your blood sugar at least 4 times a day, starting 2 days before surgery, to make sure that the level is not too high or low. o Check your blood sugar the morning of your surgery when you wake up and every 2 hours until you get to the Short Stay unit. . If your blood sugar is less than 70  mg/dL, you will need to treat for low blood sugar: o Do not take insulin. o Treat a low blood sugar (less than 70 mg/dL) with  cup of clear juice (cranberry or apple), 4 glucose tablets, OR glucose gel. Recheck blood sugar in 15 minutes after treatment (to make sure it is greater than 70 mg/dL). If your blood sugar is not greater than 70 mg/dL on recheck, call [phone] o  for further instructions. . Report your blood sugar to the short stay nurse when you get to Short Stay.  . If you are admitted to the hospital after surgery: o Your blood sugar will be checked by the staff and you will probably be given insulin after surgery (instead of oral diabetes medicines) to make sure you have good blood sugar levels. o The goal for blood sugar control after surgery is 80-180 mg/dL.              WHAT DO I DO ABOUT MY DIABETES MEDICATION?   Marland Kitchen Do not take oral diabetes medicines (pills) the morning of surgery.Glimepiride(Amaryl),Metformin(Glucophage).               Reviewed and Endorsed by [facility] Patient Education Committee, August 2015   Do not wear jewelry.  Do not wear lotions, powders, or perfumes, or deodorant.  Do not shave 48 hours prior to surgery.  Men may shave face and neck.   Do not bring valuables to the hospital.   [facility] is not responsible for any belongings or valuables.  Contacts, dentures or  bridgework may not be worn into surgery.  Leave your suitcase in the car.  After surgery it may be brought to your room.  For patients admitted to the hospital, discharge time will be determined by your treatment team.  Patients discharged the day of surgery will not be allowed to drive home.    Special Instructions: [place] - Preparing for Surgery  Before surgery, you can play an important role.  Because skin is not sterile, your skin needs to be as free of germs as possible.  You can reduce the number of germs on you skin by washing with CHG  (chlorahexidine gluconate) soap before surgery.  CHG is an antiseptic cleaner which kills germs and bonds with the skin to continue killing germs even after washing.  Please DO NOT use if you have an allergy to CHG or antibacterial soaps.  If your skin becomes reddened/irritated stop using the CHG and inform your nurse when you arrive at Short Stay.  Do not shave (including legs and underarms) for at least 48 hours prior to the first CHG shower.  You may shave your face.  Please follow these instructions carefully:   1.  Shower with CHG Soap the night before surgery and the   morning of Surgery.  2.  If you choose to wash your hair, wash your hair first as usual with your normal shampoo.  3.  After you shampoo, rinse your hair and body thoroughly to remove the  Shampoo.  4.  Use CHG as you would any other liquid soap.  You can apply chg directly  to the skin and wash gently with scrungie or a clean washcloth.  5.  Apply the CHG Soap to your body ONLY FROM THE NECK DOWN.   Do not use on open wounds or open sores.  Avoid contact with your eyes,  ears, mouth and genitals (private parts).  Wash genitals (private parts) with your normal soap.  6.  Wash thoroughly, paying special attention to the area where your surgery will be performed.  7.  Thoroughly rinse your body with warm water from the neck down.  8.  DO NOT shower/wash with your normal soap after using and rinsing o  the CHG Soap.  9.  Pat yourself dry with a clean towel.            10.  Wear clean pajamas.            11.  Place clean sheets on your bed the night of your first shower and do not sleep with pets.  Day of Surgery  Do not apply any lotions/deodorants the morning of surgery.  Please wear clean clothes to the hospital/surgery center.     Please read over the following fact sheets that you were given. Pain Booklet, Coughing and Deep Breathing and Surgical Site Infection Prevention

## 2017-07-10 NOTE — Progress Notes (Signed)
   07/10/17 0940  OBSTRUCTIVE SLEEP APNEA  Have you ever been diagnosed with sleep apnea through a sleep study? No  Do you snore loudly (loud enough to be heard through closed doors)?  1  Do you often feel tired, fatigued, or sleepy during the daytime (such as falling asleep during driving or talking to someone)? 0  Has anyone observed you stop breathing during your sleep? 1  Do you have, or are you being treated for high blood pressure? 1  BMI more than 35 kg/m2? 0  Age > 50 (1-yes) 1  Neck circumference greater than:Male 16 inches or larger, Male 17inches or larger? 0  Male Gender (Yes=1) 1  Obstructive Sleep Apnea Score 5

## 2017-07-10 NOTE — Progress Notes (Signed)
OSA score sent to PCP Billey Gosling MD  Cardiac clearance in Epic. Cardiologist  Dr. Marlou Porch.

## 2017-07-11 LAB — HEMOGLOBIN A1C
HEMOGLOBIN A1C: 6.7 % — AB (ref 4.8–5.6)
MEAN PLASMA GLUCOSE: 146 mg/dL

## 2017-07-12 ENCOUNTER — Other Ambulatory Visit: Payer: Self-pay | Admitting: Internal Medicine

## 2017-07-16 ENCOUNTER — Encounter (HOSPITAL_COMMUNITY): Payer: Self-pay | Admitting: *Deleted

## 2017-07-16 ENCOUNTER — Ambulatory Visit (HOSPITAL_COMMUNITY): Payer: Medicare HMO | Admitting: Vascular Surgery

## 2017-07-16 ENCOUNTER — Ambulatory Visit (HOSPITAL_COMMUNITY): Payer: Medicare HMO | Admitting: Certified Registered Nurse Anesthetist

## 2017-07-16 ENCOUNTER — Encounter (HOSPITAL_COMMUNITY)
Admission: RE | Disposition: A | Discharge status: Home / Self Care | Payer: Self-pay | Source: Ambulatory Visit | Attending: Surgery

## 2017-07-16 ENCOUNTER — Ambulatory Visit (HOSPITAL_COMMUNITY)
Admission: RE | Admit: 2017-07-16 | Discharge: 2017-07-16 | Disposition: A | Discharge status: Home / Self Care | Payer: Medicare HMO | Source: Ambulatory Visit | Attending: Surgery | Admitting: Surgery

## 2017-07-16 DIAGNOSIS — Z79899 Other long term (current) drug therapy: Secondary | ICD-10-CM | POA: Insufficient documentation

## 2017-07-16 DIAGNOSIS — I10 Essential (primary) hypertension: Secondary | ICD-10-CM | POA: Insufficient documentation

## 2017-07-16 DIAGNOSIS — N189 Chronic kidney disease, unspecified: Secondary | ICD-10-CM | POA: Diagnosis not present

## 2017-07-16 DIAGNOSIS — K429 Umbilical hernia without obstruction or gangrene: Secondary | ICD-10-CM | POA: Insufficient documentation

## 2017-07-16 DIAGNOSIS — K439 Ventral hernia without obstruction or gangrene: Secondary | ICD-10-CM | POA: Insufficient documentation

## 2017-07-16 DIAGNOSIS — E118 Type 2 diabetes mellitus with unspecified complications: Secondary | ICD-10-CM | POA: Diagnosis not present

## 2017-07-16 DIAGNOSIS — Z7982 Long term (current) use of aspirin: Secondary | ICD-10-CM | POA: Diagnosis not present

## 2017-07-16 DIAGNOSIS — K436 Other and unspecified ventral hernia with obstruction, without gangrene: Secondary | ICD-10-CM | POA: Diagnosis not present

## 2017-07-16 DIAGNOSIS — Z87891 Personal history of nicotine dependence: Secondary | ICD-10-CM | POA: Insufficient documentation

## 2017-07-16 DIAGNOSIS — Z8673 Personal history of transient ischemic attack (TIA), and cerebral infarction without residual deficits: Secondary | ICD-10-CM | POA: Diagnosis not present

## 2017-07-16 DIAGNOSIS — I252 Old myocardial infarction: Secondary | ICD-10-CM | POA: Insufficient documentation

## 2017-07-16 DIAGNOSIS — Z888 Allergy status to other drugs, medicaments and biological substances status: Secondary | ICD-10-CM | POA: Insufficient documentation

## 2017-07-16 DIAGNOSIS — K420 Umbilical hernia with obstruction, without gangrene: Secondary | ICD-10-CM | POA: Diagnosis not present

## 2017-07-16 DIAGNOSIS — I129 Hypertensive chronic kidney disease with stage 1 through stage 4 chronic kidney disease, or unspecified chronic kidney disease: Secondary | ICD-10-CM | POA: Diagnosis not present

## 2017-07-16 HISTORY — PX: INSERTION OF MESH: SHX5868

## 2017-07-16 HISTORY — PX: VENTRAL HERNIA REPAIR: SHX424

## 2017-07-16 LAB — GLUCOSE, CAPILLARY
Glucose-Capillary: 113 mg/dL — ABNORMAL HIGH (ref 65–99)
Glucose-Capillary: 166 mg/dL — ABNORMAL HIGH (ref 65–99)

## 2017-07-16 SURGERY — REPAIR, HERNIA, VENTRAL, LAPAROSCOPIC
Anesthesia: General | Site: Abdomen

## 2017-07-16 MED ORDER — GABAPENTIN 300 MG PO CAPS
300.0000 mg | ORAL_CAPSULE | ORAL | Status: AC
  Administered 2017-07-16: 300 mg via ORAL
  Filled 2017-07-16: qty 1

## 2017-07-16 MED ORDER — DOCUSATE SODIUM 100 MG PO CAPS: 100.0000 mg | ORAL_CAPSULE | Freq: Two times a day (BID) | ORAL | 0 refills | Status: AC

## 2017-07-16 MED ORDER — MIDAZOLAM HCL 2 MG/2ML IJ SOLN
INTRAMUSCULAR | Status: AC
  Filled 2017-07-16: qty 2

## 2017-07-16 MED ORDER — BUPIVACAINE-EPINEPHRINE (PF) 0.5% -1:200000 IJ SOLN
INTRAMUSCULAR | Status: AC
  Filled 2017-07-16: qty 30

## 2017-07-16 MED ORDER — FENTANYL CITRATE (PF) 250 MCG/5ML IJ SOLN
INTRAMUSCULAR | Status: AC
  Filled 2017-07-16: qty 5

## 2017-07-16 MED ORDER — FENTANYL CITRATE (PF) 250 MCG/5ML IJ SOLN
INTRAMUSCULAR | Status: DC | PRN
  Administered 2017-07-16: 25 ug via INTRAVENOUS
  Administered 2017-07-16: 100 ug via INTRAVENOUS
  Administered 2017-07-16: 50 ug via INTRAVENOUS
  Administered 2017-07-16: 100 ug via INTRAVENOUS
  Administered 2017-07-16: 50 ug via INTRAVENOUS

## 2017-07-16 MED ORDER — CHLORHEXIDINE GLUCONATE 4 % EX LIQD: 60.0000 mL | Freq: Once | CUTANEOUS | Status: DC

## 2017-07-16 MED ORDER — SODIUM CHLORIDE 0.9% FLUSH: 3.0000 mL | INTRAVENOUS | Status: DC | PRN

## 2017-07-16 MED ORDER — ONDANSETRON HCL 4 MG/2ML IJ SOLN
INTRAMUSCULAR | Status: DC | PRN
  Administered 2017-07-16: 4 mg via INTRAVENOUS

## 2017-07-16 MED ORDER — HYDROMORPHONE HCL 1 MG/ML IJ SOLN: 0.2500 mg | INTRAMUSCULAR | Status: DC | PRN

## 2017-07-16 MED ORDER — FENTANYL CITRATE (PF) 100 MCG/2ML IJ SOLN: 25.0000 ug | INTRAMUSCULAR | Status: DC | PRN

## 2017-07-16 MED ORDER — OXYCODONE-ACETAMINOPHEN 5-325 MG PO TABS: 1.0000 | ORAL_TABLET | Freq: Four times a day (QID) | ORAL | 0 refills | Status: DC | PRN

## 2017-07-16 MED ORDER — ONDANSETRON HCL 4 MG/2ML IJ SOLN: 4.0000 mg | Freq: Once | INTRAMUSCULAR | Status: DC | PRN

## 2017-07-16 MED ORDER — LIDOCAINE 2% (20 MG/ML) 5 ML SYRINGE
INTRAMUSCULAR | Status: DC | PRN
  Administered 2017-07-16: 100 mg via INTRAVENOUS

## 2017-07-16 MED ORDER — SODIUM CHLORIDE 0.9 % IV SOLN: 250.0000 mL | INTRAVENOUS | Status: DC | PRN

## 2017-07-16 MED ORDER — ROCURONIUM BROMIDE 50 MG/5ML IV SOLN
INTRAVENOUS | Status: AC
  Filled 2017-07-16: qty 1

## 2017-07-16 MED ORDER — CEFAZOLIN SODIUM-DEXTROSE 2-4 GM/100ML-% IV SOLN
2.0000 g | INTRAVENOUS | Status: AC
  Administered 2017-07-16: 2 g via INTRAVENOUS
  Filled 2017-07-16: qty 100

## 2017-07-16 MED ORDER — ROCURONIUM BROMIDE 10 MG/ML (PF) SYRINGE
PREFILLED_SYRINGE | INTRAVENOUS | Status: DC | PRN
  Administered 2017-07-16: 10 mg via INTRAVENOUS
  Administered 2017-07-16: 50 mg via INTRAVENOUS
  Administered 2017-07-16: 5 mg via INTRAVENOUS

## 2017-07-16 MED ORDER — ACETAMINOPHEN 500 MG PO TABS
1000.0000 mg | ORAL_TABLET | ORAL | Status: AC
  Administered 2017-07-16: 1000 mg via ORAL
  Filled 2017-07-16: qty 2

## 2017-07-16 MED ORDER — ONDANSETRON HCL 4 MG/2ML IJ SOLN
INTRAMUSCULAR | Status: AC
  Filled 2017-07-16: qty 2

## 2017-07-16 MED ORDER — SUGAMMADEX SODIUM 200 MG/2ML IV SOLN
INTRAVENOUS | Status: DC | PRN
  Administered 2017-07-16: 200 mg via INTRAVENOUS

## 2017-07-16 MED ORDER — ACETAMINOPHEN 325 MG PO TABS
650.0000 mg | ORAL_TABLET | ORAL | Status: DC | PRN
Start: 2017-07-16 — End: 2017-07-16

## 2017-07-16 MED ORDER — OXYCODONE HCL 5 MG PO TABS: 5.0000 mg | ORAL_TABLET | ORAL | Status: DC | PRN

## 2017-07-16 MED ORDER — LIDOCAINE 2% (20 MG/ML) 5 ML SYRINGE
INTRAMUSCULAR | Status: AC
  Filled 2017-07-16: qty 5

## 2017-07-16 MED ORDER — LACTATED RINGERS IV SOLN
INTRAVENOUS | Status: DC
  Administered 2017-07-16: 09:00:00 via INTRAVENOUS

## 2017-07-16 MED ORDER — GLYCOPYRROLATE 0.2 MG/ML IJ SOLN
INTRAMUSCULAR | Status: DC | PRN
  Administered 2017-07-16: 0.2 mg via INTRAVENOUS

## 2017-07-16 MED ORDER — 0.9 % SODIUM CHLORIDE (POUR BTL) OPTIME
TOPICAL | Status: DC | PRN
  Administered 2017-07-16: 1000 mL

## 2017-07-16 MED ORDER — PROPOFOL 10 MG/ML IV BOLUS
INTRAVENOUS | Status: DC | PRN
  Administered 2017-07-16: 20 mg via INTRAVENOUS
  Administered 2017-07-16: 120 mg via INTRAVENOUS

## 2017-07-16 MED ORDER — DEXAMETHASONE SODIUM PHOSPHATE 10 MG/ML IJ SOLN
INTRAMUSCULAR | Status: AC
  Filled 2017-07-16: qty 1

## 2017-07-16 MED ORDER — ACETAMINOPHEN 650 MG RE SUPP: 650.0000 mg | RECTAL | Status: DC | PRN

## 2017-07-16 MED ORDER — BUPIVACAINE-EPINEPHRINE 0.25% -1:200000 IJ SOLN
INTRAMUSCULAR | Status: DC | PRN
  Administered 2017-07-16: 20 mL

## 2017-07-16 MED ORDER — DEXAMETHASONE SODIUM PHOSPHATE 10 MG/ML IJ SOLN
INTRAMUSCULAR | Status: DC | PRN
  Administered 2017-07-16: 5 mg via INTRAVENOUS

## 2017-07-16 MED ORDER — PROPOFOL 10 MG/ML IV BOLUS
INTRAVENOUS | Status: AC
  Filled 2017-07-16: qty 20

## 2017-07-16 MED ORDER — MEPERIDINE HCL 50 MG/ML IJ SOLN: 6.2500 mg | INTRAMUSCULAR | Status: DC | PRN

## 2017-07-16 MED ORDER — SODIUM CHLORIDE 0.9% FLUSH: 3.0000 mL | Freq: Two times a day (BID) | INTRAVENOUS | Status: DC

## 2017-07-16 SURGICAL SUPPLY — 51 items
ADH SKN CLS APL DERMABOND .7 (GAUZE/BANDAGES/DRESSINGS) ×1
APPLIER CLIP 5 13 M/L LIGAMAX5 (MISCELLANEOUS)
APR CLP MED LRG 5 ANG JAW (MISCELLANEOUS)
BINDER ABD UNIV 12 45-62 (WOUND CARE) ×1 IMPLANT
BINDER ABDOMINAL 12 ML 46-62 (SOFTGOODS) ×2 IMPLANT
BINDER ABDOMINAL 46IN 62IN (WOUND CARE) ×3
BLADE CLIPPER SURG (BLADE) ×2 IMPLANT
CANISTER SUCT 3000ML PPV (MISCELLANEOUS) IMPLANT
CHLORAPREP W/TINT 26ML (MISCELLANEOUS) ×3 IMPLANT
CLIP APPLIE 5 13 M/L LIGAMAX5 (MISCELLANEOUS) IMPLANT
COVER SURGICAL LIGHT HANDLE (MISCELLANEOUS) ×3 IMPLANT
DERMABOND ADVANCED (GAUZE/BANDAGES/DRESSINGS) ×2
DERMABOND ADVANCED .7 DNX12 (GAUZE/BANDAGES/DRESSINGS) ×1 IMPLANT
DEVICE PMI PUNCTURE CLOSURE (MISCELLANEOUS) ×3 IMPLANT
DEVICE SECURE STRAP 25 ABSORB (INSTRUMENTS) ×3 IMPLANT
DEVICE TROCAR PUNCTURE CLOSURE (ENDOMECHANICALS) ×2 IMPLANT
ELECT REM PT RETURN 9FT ADLT (ELECTROSURGICAL) ×3
ELECTRODE REM PT RTRN 9FT ADLT (ELECTROSURGICAL) ×1 IMPLANT
GLOVE BIO SURGEON STRL SZ 6 (GLOVE) ×3 IMPLANT
GLOVE BIOGEL PI IND STRL 6.5 (GLOVE) ×1 IMPLANT
GLOVE BIOGEL PI IND STRL 7.0 (GLOVE) IMPLANT
GLOVE BIOGEL PI INDICATOR 6.5 (GLOVE) ×2
GLOVE BIOGEL PI INDICATOR 7.0 (GLOVE) ×4
GLOVE SURG SS PI 6.5 STRL IVOR (GLOVE) ×4 IMPLANT
GOWN STRL REUS W/ TWL LRG LVL3 (GOWN DISPOSABLE) ×3 IMPLANT
GOWN STRL REUS W/TWL LRG LVL3 (GOWN DISPOSABLE) ×9
KIT BASIN OR (CUSTOM PROCEDURE TRAY) ×3 IMPLANT
KIT ROOM TURNOVER OR (KITS) ×3 IMPLANT
MARKER SKIN DUAL TIP RULER LAB (MISCELLANEOUS) ×3 IMPLANT
MESH VENTRALIGHT ST 6IN CRC (Mesh General) ×2 IMPLANT
NDL SPNL 22GX3.5 QUINCKE BK (NEEDLE) ×1 IMPLANT
NEEDLE SPNL 22GX3.5 QUINCKE BK (NEEDLE) ×3 IMPLANT
NS IRRIG 1000ML POUR BTL (IV SOLUTION) ×3 IMPLANT
PAD ARMBOARD 7.5X6 YLW CONV (MISCELLANEOUS) ×6 IMPLANT
SCISSORS LAP 5X35 DISP (ENDOMECHANICALS) ×3 IMPLANT
SET IRRIG TUBING LAPAROSCOPIC (IRRIGATION / IRRIGATOR) IMPLANT
SHEARS HARMONIC ACE PLUS 36CM (ENDOMECHANICALS) IMPLANT
SLEEVE ENDOPATH XCEL 5M (ENDOMECHANICALS) ×6 IMPLANT
SUT ETHIBOND CT1 BRD #0 30IN (SUTURE) ×2 IMPLANT
SUT MNCRL AB 4-0 PS2 18 (SUTURE) ×3 IMPLANT
SUT NOVA NAB GS-21 0 18 T12 DT (SUTURE) ×2 IMPLANT
SUT PDS AB 2-0 CT1 27 (SUTURE) IMPLANT
TOWEL OR 17X24 6PK STRL BLUE (TOWEL DISPOSABLE) ×3 IMPLANT
TOWEL OR 17X26 10 PK STRL BLUE (TOWEL DISPOSABLE) ×3 IMPLANT
TRAY FOLEY CATH SILVER 14FR (SET/KITS/TRAYS/PACK) ×2 IMPLANT
TRAY LAPAROSCOPIC MC (CUSTOM PROCEDURE TRAY) ×3 IMPLANT
TROCAR XCEL BLUNT TIP 100MML (ENDOMECHANICALS) IMPLANT
TROCAR XCEL NON-BLD 11X100MML (ENDOMECHANICALS) ×3 IMPLANT
TROCAR XCEL NON-BLD 5MMX100MML (ENDOMECHANICALS) ×3 IMPLANT
TUBING INSUFFLATION (TUBING) ×3 IMPLANT
WATER STERILE IRR 1000ML POUR (IV SOLUTION) ×3 IMPLANT

## 2017-07-16 NOTE — Anesthesia Postprocedure Evaluation (Signed)
Anesthesia Post Note  Patient: Corey [name]  Procedure(s) Performed: LAPAROSCOPIC VENTRAL HERNIA REPAIR WITH MESH (N/A Abdomen) INSERTION OF MESH (N/A Abdomen)     Patient location during evaluation: PACU Anesthesia Type: General Level of consciousness: awake and alert Pain management: pain level controlled Vital Signs Assessment: post-procedure vital signs reviewed and stable Respiratory status: spontaneous breathing, nonlabored ventilation, respiratory function stable and patient connected to nasal cannula oxygen Cardiovascular status: blood pressure returned to baseline and stable Postop Assessment: no apparent nausea or vomiting Anesthetic complications: no    Last Vitals:  Vitals:   07/16/17 1220 07/16/17 1229  BP:  120/77  Pulse:  (!) 56  Resp:  17  Temp:  36.5 C  SpO2: 100% 100%    Last Pain:  Vitals:   07/16/17 0809  TempSrc: Oral                 Jabriel Vanduyne DAVID

## 2017-07-16 NOTE — Op Note (Signed)
Operative Note  Corey [name]  [id number]  [id number]  07/16/2017   Surgeon: Vikki Ports A ConnorMD  Assistant: Carlena Hurl PA-C  Procedure performed: laparoscopic repair of epigastric and umbilical hernia   Preop diagnosis: chronically incarcerated epigastric hernia Post-op diagnosis/intraop findings: epigastric hernia 3cm, umbilical hernia 1cm  Specimens: no Retained items: no EBL: minimal cc Complications: none  Description of procedure: After obtaining informed consent the patient was taken to the operating room and placed supine on operating room table wheregeneral endotracheal anesthesia was initiated, preoperative antibiotics were administered, SCDs applied, and a formal timeout was performed. The patient's abdomen was prepped and draped in usual sterile fashion. Peritoneal access was gained with a Visiport technique in the left upper quadrant and insufflation to 15 mmHg ensued without issue. Gross inspection revealed no evidence of injury from our entry. The epigastric hernia was reduced, the fascial defect was measured at 3 cm while the abdomen was insufflated. He was also noted to have a very small umbilical hernia. There was a very thin band of residual adhesion from the prior G-tube site superiorly and this was left alone. Under direct visualization, 2 more left-sided 5 mm trochars were introduced. Using cautery and sharp dissection, the falciform ligament was taken down off the abdominal wall several centimeters to provide a landing zone for the mesh. We then made a transverse incision over the region of the hernia and excised the very thickened hernia sac. A 15 cm ventralex circular mesh was selected. 0 Ethibond sutures were placed at the 4 cardinal directions and the mesh marked to orient the rough side up. The mesh was then inserted through the hernia defect. The hernia defect was closed with 3 interrupted sutures of 0 Ethibond. The abdomen was reinsufflated and all fascial repair  was noted to be airtight. The mesh was unfurled and appropriately oriented. The PMI device was then used to bring the previously secured sutures through the abdominal wall in the 4 cardinal points. These were tied down and the mesh was noted to sit nicely and flushed, with several centimeters overlap of the epigastric hernia and extending inferiorly to cover the area of the umbilical hernia. The secure strap tacker was then used to further secure the mesh to the abdominal wall including an inner crown of tacks. The abdomen was then reinspected and found to be hemostatic and without any undue injury. The abdomen was desufflated and all trochars removed. The skin incisions were closed with subcuticular Monocryl and Dermabond. The patient was then awakened, extubated and taken to PACU in stable condition.   All counts were correct at the completion of the case.

## 2017-07-16 NOTE — Discharge Instructions (Signed)
HERNIA REPAIR: POST OP INSTRUCTIONS  ######################################################################  EAT Gradually transition to a high fiber diet with a fiber supplement over the next few weeks after discharge.  Start with a pureed / full liquid diet (see below)  WALK Walk an hour a day.  Control your pain to do that.    CONTROL PAIN Control pain so that you can walk, sleep, tolerate sneezing/coughing, go up/down stairs.  HAVE A BOWEL MOVEMENT DAILY Keep your bowels regular to avoid problems.  OK to try a laxative to override constipation.  OK to use an antidairrheal to slow down diarrhea.  Call if not better after 2 tries  CALL IF YOU HAVE PROBLEMS/CONCERNS Call if you are still struggling despite following these instructions. Call if you have concerns not answered by these instructions  ######################################################################    1. DIET: Follow a light bland diet the first 24 hours after arrival home, such as soup, liquids, crackers, etc.  Be sure to include lots of fluids daily.  Avoid fast food or heavy meals as your are more likely to get nauseated.  Eat a low fat the next few days after surgery. 2. Take your usually prescribed home medications unless otherwise directed. 3. PAIN CONTROL: a. Pain is best controlled by a usual combination of three different methods TOGETHER: i. Ice/Heat ii. Over the counter pain medication iii. Prescription pain medication b. Most patients will experience some swelling and bruising around the hernia(s) such as the bellybutton, groins, or old incisions.  Ice packs or heating pads (30-60 minutes up to 6 times a day) will help. Use ice for the first few days to help decrease swelling and bruising, then switch to heat to help relax tight/sore spots and speed recovery.  Some people prefer to use ice alone, heat alone, alternating between ice & heat.  Experiment to what works for you.  Swelling and bruising can take  several weeks to resolve.   c. It is helpful to take an over-the-counter pain medication regularly for the first few weeks.  Choose one of the following that works best for you: i. Naproxen (Aleve, etc)  Two 220mg  tabs twice a day ii. Ibuprofen (Advil, etc) Three 200mg  tabs four times a day (every meal & bedtime) iii. Acetaminophen (Tylenol, etc) 325-650mg  four times a day (every meal & bedtime) d. A  prescription for pain medication should be given to you upon discharge.  Take your pain medication as prescribed.  i. If you are having problems/concerns with the prescription medicine (does not control pain, nausea, vomiting, rash, itching, etc), please call us [phone] to see if we need to switch you to a different pain medicine that will work better for you and/or control your side effect better. ii. If you need a refill on your pain medication, please contact your pharmacy.  They will contact our office to request authorization. Prescriptions will not be filled after 5 pm or on week-ends. 4. Avoid getting constipated.  Between the surgery and the pain medications, it is common to experience some constipation.  Increasing fluid intake and taking a fiber supplement (such as Metamucil, Citrucel, FiberCon, MiraLax, etc) 1-2 times a day regularly will usually help prevent this problem from occurring.  A mild laxative (prune juice, Milk of Magnesia, MiraLax, etc) should be taken according to package directions if there are no bowel movements after 48 hours.   5. Wash / shower every day.  You may shower over the skin glue which is waterproof.   6. Skin  glue will flake off after about 2 weeks.  You may leave the incision open to air.  You may replace a dressing/Band-Aid to cover the incision for comfort if you wish.  Continue to shower over incision(s) after the dressing is off.    7. ACTIVITIES as tolerated:   a. You may resume regular (light) daily activities beginning the next day--such as daily  self-care, walking, climbing stairs--gradually increasing activities as tolerated.  If you can walk 30 minutes without difficulty, it is safe to try more intense activity such as jogging, treadmill, bicycling, low-impact aerobics, swimming, etc. b. Refrain from the most intensive and strenuous activity for last such as sit-ups, heavy lifting, contact sports, etc  Refrain from any heavy lifting or straining until 6 weeks after surgery  c. DO NOT PUSH THROUGH PAIN.  Let pain be your guide: If it hurts to do something, don't do it.  Pain is your body warning you to avoid that activity for another week until the pain goes down. d. You may drive when you are no longer taking prescription pain medication, you can comfortably wear a seatbelt, and you can safely maneuver your car and apply brakes. e. Dennis Bast may have sexual intercourse when it is comfortable.  8. FOLLOW UP in our office a. Please call [facility] at [phone] to set up an appointment to see your surgeon in the office for a follow-up appointment approximately 2-3 weeks after your surgery. b. Make sure that you call for this appointment the day you arrive home to insure a convenient appointment time. 9.  IF YOU HAVE DISABILITY OR FAMILY LEAVE FORMS, BRING THEM TO THE OFFICE FOR PROCESSING.  DO NOT GIVE THEM TO YOUR DOCTOR.  WHEN TO CALL us [phone]: 1. Poor pain control 2. Reactions / problems with new medications (rash/itching, nausea, etc)  3. Fever over 101.5 F (38.5 C) 4. Inability to urinate 5. Nausea and/or vomiting 6. Worsening swelling or bruising 7. Continued bleeding from incision. 8. Increased pain, redness, or drainage from the incision   The clinic staff is available to answer your questions during regular business hours (8:30am-5pm).  Please dont hesitate to call and ask to speak to one of our nurses for clinical concerns.   If you have a medical emergency, go to the nearest emergency room or call 911.  A surgeon from  [facility] is always on call at the hospitals in [facility], [place], [place], [place], [place]  [address] ?  [address] [address], [place], [place]   [address] MAIN: [id number] ? TOLL FREE: [phone] ? FAX: [phone] [email or web address]

## 2017-07-16 NOTE — H&P (Signed)
Corey [name] DOB: 1946/04/17 Married / Language: English / Race: Black or African American Male  History of Present Illness  Patient words: Very nice 72 year old man who is referred for abdominal wall hernia. It has been there for years. He states that it will poke out until push it back in but he has not had any episodes of incarceration. He does not think it has changed in size significantly since he first noticed it. It does cause him some discomfort. He denies any prior abdominal surgical history aside from a gastrostomy tube in 2014 which has since been removed. This was at the time of the stroke.   Allergies  HYDROCHLOROTHIAZIDE ALLOPURINOL  Medication History  Norvasc (10MG  Tablet, Oral) Active. Aspirin (81MG  Tablet, Oral) Active. CloNIDine HCl (0.3MG  Tablet, Oral) Active. Colcrys (0.6MG  Tablet, Oral) Active. Glimepiride (1MG  Tablet, Oral) Active. HydrALAZINE HCl (50MG  Tablet, Oral) Active. Medications Reconciled     Review of Systems  General Not Present- Appetite Loss, Chills, Fatigue, Fever, Night Sweats, Weight Gain and Weight Loss. Skin Not Present- Change in Wart/Mole, Dryness, Hives, Jaundice, New Lesions, Non-Healing Wounds, Rash and Ulcer. HEENT Present- Hearing Loss. Not Present- Earache, Hoarseness, Nose Bleed, Oral Ulcers, Ringing in the Ears, Seasonal Allergies, Sinus Pain, Sore Throat, Visual Disturbances, Wears glasses/contact lenses and Yellow Eyes. Respiratory Not Present- Bloody sputum, Chronic Cough, Difficulty Breathing, Snoring and Wheezing. Breast Not Present- Breast Mass, Breast Pain, Nipple Discharge and Skin Changes. Cardiovascular Not Present- Chest Pain, Difficulty Breathing Lying Down, Leg Cramps, Palpitations, Rapid Heart Rate, Shortness of Breath and Swelling of Extremities. Gastrointestinal Not Present- Abdominal Pain, Bloating, Bloody Stool, Change in Bowel Habits, Chronic diarrhea, Constipation, Difficulty Swallowing,  Excessive gas, Gets full quickly at meals, Hemorrhoids, Indigestion, Nausea, Rectal Pain and Vomiting. Male Genitourinary Present- Urgency and Urine Leakage. Not Present- Blood in Urine, Change in Urinary Stream, Frequency, Impotence, Nocturia and Painful Urination. Musculoskeletal Not Present- Back Pain, Joint Pain, Joint Stiffness, Muscle Pain, Muscle Weakness and Swelling of Extremities. Neurological Not Present- Decreased Memory, Fainting, Headaches, Numbness, Seizures, Tingling, Tremor, Trouble walking and Weakness. Psychiatric Not Present- Anxiety, Bipolar, Change in Sleep Pattern, Depression, Fearful and Frequent crying. Endocrine Not Present- Cold Intolerance, Excessive Hunger, Hair Changes, Heat Intolerance, Hot flashes and New Diabetes. Hematology Not Present- Blood Thinners, Easy Bruising, Excessive bleeding, Gland problems, HIV and Persistent Infections.  Vitals:   07/16/17 0809  BP: (!) 167/72  Pulse: 83  Resp: 20  Temp: (!) 97.4 F (36.3 C)  SpO2: 100%      Physical Exam  General Note: alert and well appearing  Integumentary Note: warm and dry  Head and Neck Note: no mass or thyromegaly  Eye Note: No scleral icterus. Extra ocular motions intact.  ENMT Note: Moist mucous membranes, dentition intact  Chest and Lung Exam Note: Unlabored respirations, clear bilaterally  Cardiovascular Note: Regular rate and rhythm, no pedal edema  Abdomen Note: Soft, nontender nondistended. There is a midline diastases approximately 8 cm in width. There is a well-healed upper midline scar from prior gastrostomy tube. Several centimeters inferior to this there is a 2 cm fascial defect with reducible contents. Some mild overlying skin discoloration.  Neurologic Note: Grossly intact, normal gait  Neuropsychiatric Note: Normal mood and affect. Appropriate insight.  Musculoskeletal Note: Strength symmetrical throughout, no  deformity    Assessment & Plan   DIASTASIS RECTI (M62.08)   VENTRAL HERNIA (K43.9) Story: Approximately 2 cm defect within moderate diastases, several centimeters inferior to prior gastrostomy tube site. We discussed options  of endoscopic versus open repair. Discussed risks of surgery including bleeding, infection, pain, scarring, intra-abdominal injury, hernia recurrence, as well as general risks of stroke, heart attack, pneumonia, blood clots and death. Discussed risks of no surgery including increasing size and symptoms of the hernia, low risk of incarceration/strangulation and need for emergency surgery. After discussion he desires to proceed with hernia repair. We'll attempt laparoscopic repair with mesh. He will need cardiac clearance.

## 2017-07-16 NOTE — Anesthesia Preprocedure Evaluation (Signed)
Anesthesia Evaluation  Patient identified by MRN, date of birth, ID band Patient awake    Reviewed: Allergy & Precautions, NPO status , Patient's Chart, lab work & pertinent test results  Airway Mallampati: I  TM Distance: >3 FB Neck ROM: Full    Dental   Pulmonary former smoker,    Pulmonary exam normal        Cardiovascular hypertension, Pt. on medications + Past MI  Normal cardiovascular exam     Neuro/Psych CVA    GI/Hepatic   Endo/Other  diabetes, Type 2, Oral Hypoglycemic Agents  Renal/GU Renal InsufficiencyRenal disease     Musculoskeletal   Abdominal   Peds  Hematology   Anesthesia Other Findings   Reproductive/Obstetrics                             Anesthesia Physical Anesthesia Plan  ASA: III  Anesthesia Plan: General   Post-op Pain Management:    Induction: Intravenous  PONV Risk Score and Plan: 2 and Ondansetron, Midazolam and Dexamethasone  Airway Management Planned: Oral ETT  Additional Equipment:   Intra-op Plan:   Post-operative Plan: Extubation in OR  Informed Consent: I have reviewed the patients History and Physical, chart, labs and discussed the procedure including the risks, benefits and alternatives for the proposed anesthesia with the patient or authorized representative who has indicated his/her understanding and acceptance.     Plan Discussed with: CRNA and Surgeon  Anesthesia Plan Comments:         Anesthesia Quick Evaluation

## 2017-07-16 NOTE — Anesthesia Procedure Notes (Addendum)
Procedure Name: Intubation Date/Time: 07/16/2017 10:18 AM Performed by: Lillia Abed, MD Pre-anesthesia Checklist: Patient identified, Emergency Drugs available, Suction available and Patient being monitored Patient Re-evaluated:Patient Re-evaluated prior to induction Oxygen Delivery Method: Circle system utilized Preoxygenation: Pre-oxygenation with 100% oxygen Induction Type: IV induction Ventilation: Mask ventilation without difficulty and Oral airway inserted - appropriate to patient size Laryngoscope Size: Mac and 4 Grade View: Grade II Tube type: Oral Tube size: 7.5 mm Number of attempts: 1 Airway Equipment and Method: Stylet Placement Confirmation: ETT inserted through vocal cords under direct vision,  positive ETCO2 and breath sounds checked- equal and bilateral Secured at: 23 cm Tube secured with: Tape Dental Injury: Dental damage  Comments: Upon scissoring the mouth, the tooth on the upper right jaw lateral to the canine came out and was placed into a cup. Dr. Conrad Carpio and Theodosia Blender CRNA aware. Will notify patient after procedure.

## 2017-07-16 NOTE — Transfer of Care (Signed)
Immediate Anesthesia Transfer of Care Note  Patient: Corey [name]  Procedure(s) Performed: LAPAROSCOPIC VENTRAL HERNIA REPAIR WITH MESH (N/A Abdomen) INSERTION OF MESH (N/A Abdomen)  Patient Location: PACU  Anesthesia Type:General  Level of Consciousness: awake, alert , oriented and patient cooperative  Airway & Oxygen Therapy: Patient Spontanous Breathing  Post-op Assessment: Report given to RN, Post -op Vital signs reviewed and stable and Patient moving all extremities X 4  Post vital signs: Reviewed and stable  Last Vitals:  Vitals:   07/16/17 0809  BP: (!) 167/72  Pulse: 83  Resp: 20  Temp: (!) 36.3 C  SpO2: 100%    Last Pain:  Vitals:   07/16/17 0809  TempSrc: Oral      Patients Stated Pain Goal: 3 (76/70/11 0034)  Complications: No apparent anesthesia complications   Pt notified of tooth coming out on intubation.

## 2017-07-17 ENCOUNTER — Encounter (HOSPITAL_COMMUNITY): Payer: Self-pay | Admitting: Surgery

## 2017-08-03 ENCOUNTER — Other Ambulatory Visit: Payer: Medicare HMO

## 2017-08-08 ENCOUNTER — Ambulatory Visit: Payer: Medicare HMO | Admitting: Endocrinology

## 2017-08-08 ENCOUNTER — Encounter: Payer: Self-pay | Admitting: Endocrinology

## 2017-08-08 VITALS — BP 132/78 | HR 71 | Ht 66.0 in | Wt 192.0 lb

## 2017-08-08 DIAGNOSIS — E1165 Type 2 diabetes mellitus with hyperglycemia: Secondary | ICD-10-CM | POA: Diagnosis not present

## 2017-08-08 MED ORDER — METFORMIN HCL ER 750 MG PO TB24: 750.0000 mg | ORAL_TABLET | Freq: Every day | ORAL | 0 refills | Status: DC

## 2017-08-08 NOTE — Patient Instructions (Addendum)
GLIMEPERIDE HALF TAB DAILY  Check blood sugars on waking up  2/7  Also check blood sugars about 2 hours after a meal and do this after different meals by rotation  Recommended blood sugar levels on waking up is 90-130 and about 2 hours after meal is 130-160  Please bring your blood sugar monitor to each visit, thank you

## 2017-08-08 NOTE — Progress Notes (Signed)
Patient ID: Corey [name], male   DOB: 1946/01/22, 72 y.o.   MRN: [id number]   Reason for Appointment : Followup of Type 2 Diabetes   History of Present Illness          Diagnosis: Type 2 diabetes mellitus, date of diagnosis: 1997      Past history: The patient was initially treated with various oral hypoglycemic drugs and details are not available. About 2009 he was started on insulin and has either taken premixed insulin or Lantus once a day He had difficulty affording his Lantus insulin and prefers to take premixed insulin  Recent history:    INSULIN regimen is described as:  none  He has not been seen in follow-up for several months again  His A1c is now 6.7, on his previous visit was 8%,  Current blood sugar patterns and problems:  He was started on low-dose Amaryl on his last visit about a year ago but he did not come back for follow-up  However his blood sugars subsequently have been significantly better despite not taking insulin  He also is starting to get occasional low normal or low blood sugars with the last reading of 61 on Monday probably after supper  Recently weight has been stable  He says that he is trying to walk at least twice a week  His wife was present today and he apparently is trying to watch his portions and sweets fairly well  However he did have an unusually high reading of 342 earlier this month but did not repeat it right away No side effects with metformin and his renal function is adequate for taking this  Hypoglycemia : none    Glucose monitoring:  done about 1 time a day         Glucometer:  Accu-Chek      Blood Glucose readings by monitor download:   Mean values apply above for all meters except median for One Touch  PRE-MEAL Fasting Lunch Dinner Bedtime Overall  Glucose range:  97-120  91-111     Mean/median:     108   POST-MEAL PC Breakfast PC Lunch PC Dinner  Glucose range:    61-78  Mean/median:       Self-care:        Meals: 2-3 meals per day. Egg in am at 9-10 am or grits , rarely sandwich at lunch. His largest meal is at suppertime at about 8 pm.  Avoiding regular soft drinks  Physical activity: exercise: Walking 30-40 minutes; 2-3/7  days        Dietician visit: Most recent: 02/2014               Oral hypoglycemic drugs the patient is taking are:  Metformin        Compliance with the medical regimen:  Fair.    Wt Readings from Last 3 Encounters:  08/08/17 192 lb (87.1 kg)  07/16/17 192 lb (87.1 kg)  07/10/17 192 lb 3.2 oz (87.2 kg)    Lab Results  Component Value Date   HGBA1C 6.7 (H) 07/10/2017   HGBA1C 6.8 (H) 04/13/2017   HGBA1C 7.2 (H) 01/23/2017    Lab Results  Component Value Date   MICROALBUR 3.5 (H) 08/09/2016   LDLCALC 68 04/13/2017   CREATININE 1.45 (H) 07/10/2017   No visits with results within 1 Week(s) from this visit.  Latest known visit with results is:  Admission on 07/16/2017, Discharged on 07/16/2017  Component Date Value Ref  Range Status  . Glucose-Capillary 07/16/2017 113* 65 - 99 mg/dL Final  . Comment 1 07/16/2017 Notify RN   Final  . Comment 2 07/16/2017 Document in Chart   Final  . Glucose-Capillary 07/16/2017 166* 65 - 99 mg/dL Final  . Comment 1 07/16/2017 Notify RN   Final  . Comment 2 07/16/2017 Document in Chart   Final     Allergies as of 08/08/2017      Reactions   Hydrochlorothiazide Other (See Comments)   Gout , uncontrolled diabetes and renal insufficiency   Allopurinol Other (See Comments)   Renal insufficiency      Medication List        Accurate as of 08/08/17 10:55 AM. Always use your most recent med list.          accu-chek softclix lancets Use to test blood sugar 2 times daily- Dx code E11.29   amLODipine 10 MG tablet Commonly known as:  NORVASC TAKE 1 TABLET EVERY DAY   aspirin EC 81 MG tablet Take 1 tablet (81 mg total) by mouth daily.   cloNIDine 0.3 MG tablet Commonly known as:  CATAPRES Take 1 tablet (0.3 mg  total) by mouth 2 (two) times daily.   colchicine 0.6 MG tablet Commonly known as:  COLCRYS Take 1 tablet (0.6 mg total) by mouth daily.   docusate sodium 100 MG capsule Commonly known as:  COLACE Take 1 capsule (100 mg total) by mouth 2 (two) times daily.   glimepiride 1 MG tablet Commonly known as:  AMARYL TAKE 1 TABLET DAILY BEFORE SUPPER.   glucose blood test strip Commonly known as:  ACCU-CHEK AVIVA PLUS Use as instructed to check blood sugars 2 times per day dx code E11.29   hydrALAZINE 50 MG tablet Commonly known as:  APRESOLINE Take 1 tablet (50 mg total) by mouth 3 (three) times daily.   isosorbide mononitrate 60 MG 24 hr tablet Commonly known as:  IMDUR TAKE 1 TABLET TWICE DAILY   losartan 100 MG tablet Commonly known as:  COZAAR Take 1 tablet (100 mg total) by mouth daily.   metFORMIN 750 MG 24 hr tablet Commonly known as:  GLUCOPHAGE-XR Take 1 tablet (750 mg total) by mouth daily with supper.   metoprolol succinate 100 MG 24 hr tablet Commonly known as:  TOPROL-XL TAKE 1 TABLET TWICE DAILY, YEARLY PHYSICAL DUE IN SEPTEMBER   oxyCODONE-acetaminophen 5-325 MG tablet Commonly known as:  PERCOCET/ROXICET Take 1 tablet by mouth every 6 (six) hours as needed for severe pain.       Allergies:  Allergies  Allergen Reactions  . Hydrochlorothiazide Other (See Comments)    Gout , uncontrolled diabetes and renal insufficiency  . Allopurinol Other (See Comments)    Renal insufficiency    Past Medical History:  Diagnosis Date  . Cataract   . Colon polyp 2003   Dr [name]; F/U was to be 2008( not completed)  . CVA (cerebral infarction) 2014  . Diabetes mellitus   . Diverticulosis 2003  . Dyspnea    with exertion  . Gout   . Hypertension   . Myocardial infarction ([place]) 2014  . Pneumonia 09/29/11   Avelox X 10 days as OP  . Prostate cancer ([place])   . Renal insufficiency   . Seizures ([place])    not treated for seizure disorder; had a seizure after  stroke 2014; no seizure since then  . Stroke [facility])    at same time he had MI  . Urinary  frequency     Past Surgical History:  Procedure Laterality Date  . COLONOSCOPY W/ POLYPECTOMY  2003   no F/U ([place] discussed 12/03/12)  . INSERTION OF MESH N/A 07/16/2017   Procedure: INSERTION OF MESH;  Surgeon: [name], MD;  Location: [place];  Service: General;  Laterality: N/A;  . PEG PLACEMENT  2014  . PEG TUBE REMOVAL  2014  . PROSTATE BIOPSY    . TRACHEOSTOMY  2014   was closed after hospital stay  . VENTRAL HERNIA REPAIR N/A 07/16/2017   Procedure: LAPAROSCOPIC VENTRAL HERNIA REPAIR WITH MESH;  Surgeon: [name], MD;  Location: [facility] OR;  Service: General;  Laterality: N/A;  . wrist aspiration  02/16/12    monosodium urate crystals; Dr Caralyn Guile    Family History  Problem Relation Age of Onset  . Hypertension Father   . Prostate cancer Father   . Diabetes Father   . Cancer Father   . Hypertension Mother   . Diabetes Mother   . Stroke Neg Hx   . Heart disease Neg Hx   . Colon cancer Neg Hx     Social History:  reports that he quit smoking about 49 years ago. His smoking use included cigarettes. He has a 0.25 pack-year smoking history. He has never used smokeless tobacco. He reports that he does not drink alcohol or use drugs.    Review of Systems   Lipids: Normal and  not on a statin drug  Lab Results  Component Value Date   CHOL 128 04/13/2017   HDL 49.60 04/13/2017   LDLCALC 68 04/13/2017   TRIG 49.0 04/13/2017   CHOLHDL 3 04/13/2017    Eye exams: Last 2017, was told to have some problem, he does not know what    He has mild renal insufficiency of unclear etiology with variable creatinine levels, now near normal   Lab Results  Component Value Date   CREATININE 1.45 (H) 07/10/2017   CREATININE 1.56 (H) 04/13/2017   CREATININE 1.86 (H) 01/23/2017     HYPERTENSION: Currently on losartan and hydralazine as well as metoprolol  BP Readings from Last 3  Encounters:  08/08/17 132/78  07/16/17 120/77  07/10/17 (!) 181/76      Physical Examination:  BP 132/78 (BP Location: Left Arm, Patient Position: Sitting, Cuff Size: Normal)   Pulse 71   Ht 5\' 6"  (1.676 m)   Wt 192 lb (87.1 kg)   SpO2 98%   BMI 30.99 kg/m    no  edema   ASSESSMENT/PLAN:  Diabetes type 2, uncontrolled   See history of present illness for discussion of current diabetes management, blood sugar patterns and problems identified  He is coming back after a year for his follow-up and recently appears to have fairly good blood sugar control with A1c 6.7 Recently his blood sugars are near normal most of the time with occasional mildly increased readings   Currently renal function is adequate to continue his metformin ER 750 mg daily Because of his tendency to mild hypoglycemia he will reduce his Amaryl down to half a tablet now Encouraged him to do more walking and also make sure he is getting some protein at every meal  Reminded him that he needs to be seen on a regular basis for his diabetes and needs to make an appointment for 3 months  HYPERTENSION: Appears better controlled recently  There are no Patient Instructions on file for this visit.   Elayne Snare 08/08/2017, 10:55  AM    

## 2017-09-07 DIAGNOSIS — C61 Malignant neoplasm of prostate: Secondary | ICD-10-CM | POA: Diagnosis not present

## 2017-09-11 DIAGNOSIS — N401 Enlarged prostate with lower urinary tract symptoms: Secondary | ICD-10-CM | POA: Diagnosis not present

## 2017-09-11 DIAGNOSIS — R351 Nocturia: Secondary | ICD-10-CM | POA: Diagnosis not present

## 2017-09-11 DIAGNOSIS — R3915 Urgency of urination: Secondary | ICD-10-CM | POA: Diagnosis not present

## 2017-09-11 DIAGNOSIS — C61 Malignant neoplasm of prostate: Secondary | ICD-10-CM | POA: Diagnosis not present

## 2017-09-24 ENCOUNTER — Other Ambulatory Visit: Payer: Self-pay | Admitting: Internal Medicine

## 2017-10-12 ENCOUNTER — Ambulatory Visit: Payer: Medicare PPO | Admitting: Internal Medicine

## 2017-10-14 ENCOUNTER — Other Ambulatory Visit: Payer: Self-pay | Admitting: Endocrinology

## 2017-10-15 ENCOUNTER — Other Ambulatory Visit: Payer: Self-pay

## 2017-10-15 MED ORDER — METFORMIN HCL ER 750 MG PO TB24: ORAL_TABLET | ORAL | 2 refills | Status: DC

## 2017-10-18 ENCOUNTER — Ambulatory Visit: Payer: Medicare HMO | Admitting: Internal Medicine

## 2017-10-22 ENCOUNTER — Other Ambulatory Visit: Payer: Self-pay | Admitting: Internal Medicine

## 2017-10-22 ENCOUNTER — Other Ambulatory Visit: Payer: Self-pay | Admitting: Endocrinology

## 2017-10-22 ENCOUNTER — Other Ambulatory Visit: Payer: Self-pay

## 2017-10-22 MED ORDER — METFORMIN HCL ER 750 MG PO TB24: ORAL_TABLET | ORAL | 2 refills | Status: DC

## 2017-11-09 ENCOUNTER — Other Ambulatory Visit: Payer: Medicare HMO

## 2017-11-12 ENCOUNTER — Ambulatory Visit: Payer: Medicare HMO | Admitting: Endocrinology

## 2017-11-12 ENCOUNTER — Other Ambulatory Visit: Payer: Self-pay | Admitting: Internal Medicine

## 2017-11-23 ENCOUNTER — Other Ambulatory Visit: Payer: Self-pay | Admitting: Internal Medicine

## 2017-11-29 ENCOUNTER — Other Ambulatory Visit (INDEPENDENT_AMBULATORY_CARE_PROVIDER_SITE_OTHER): Payer: Medicare HMO

## 2017-11-29 DIAGNOSIS — E1165 Type 2 diabetes mellitus with hyperglycemia: Secondary | ICD-10-CM | POA: Diagnosis not present

## 2017-11-29 LAB — HEMOGLOBIN A1C: HEMOGLOBIN A1C: 7.2 % — AB (ref 4.6–6.5)

## 2017-11-29 LAB — COMPREHENSIVE METABOLIC PANEL
ALBUMIN: 4.1 g/dL (ref 3.5–5.2)
ALK PHOS: 63 U/L (ref 39–117)
ALT: 28 U/L (ref 0–53)
AST: 32 U/L (ref 0–37)
BUN: 26 mg/dL — ABNORMAL HIGH (ref 6–23)
CHLORIDE: 109 meq/L (ref 96–112)
CO2: 28 mEq/L (ref 19–32)
Calcium: 10.1 mg/dL (ref 8.4–10.5)
Creatinine, Ser: 1.47 mg/dL (ref 0.40–1.50)
GFR: 60.98 mL/min (ref >60.00)
GLUCOSE: 179 mg/dL — AB (ref 70–99)
POTASSIUM: 4.4 meq/L (ref 3.5–5.1)
SODIUM: 141 meq/L (ref 135–145)
Total Bilirubin: 0.5 mg/dL (ref 0.2–1.2)
Total Protein: 7.4 g/dL (ref 6.0–8.3)

## 2017-11-29 LAB — MICROALBUMIN / CREATININE URINE RATIO
Creatinine,U: 59.7 mg/dL
MICROALB UR: 10.4 mg/dL — AB (ref 0.0–1.9)
MICROALB/CREAT RATIO: 17.4 mg/g (ref 0.0–30.0)

## 2017-12-04 ENCOUNTER — Encounter: Payer: Self-pay | Admitting: Endocrinology

## 2017-12-04 ENCOUNTER — Ambulatory Visit (INDEPENDENT_AMBULATORY_CARE_PROVIDER_SITE_OTHER): Payer: Medicare HMO | Admitting: Endocrinology

## 2017-12-04 VITALS — BP 142/76 | HR 96 | Ht 66.0 in | Wt 192.0 lb

## 2017-12-04 DIAGNOSIS — E1165 Type 2 diabetes mellitus with hyperglycemia: Secondary | ICD-10-CM

## 2017-12-04 DIAGNOSIS — Z794 Long term (current) use of insulin: Secondary | ICD-10-CM

## 2017-12-04 MED ORDER — METFORMIN HCL ER 750 MG PO TB24: ORAL_TABLET | ORAL | 0 refills | Status: DC

## 2017-12-04 NOTE — Patient Instructions (Addendum)
Take metformin 2x daily  Check blood sugars on waking up  3/7  Also check blood sugars about 2 hours after a meal and do this after different meals by rotation  Recommended blood sugar levels on waking up is 90-130 and about 2 hours after meal is 130-160  Please bring your blood sugar monitor to each visit, thank you  If sugar < 70 stop Glimeperide

## 2017-12-04 NOTE — Progress Notes (Signed)
Patient ID: Corey [name], male   DOB: 12/11/1945, 72 y.o.   MRN: [id number]   Reason for Appointment : Followup of Type 2 Diabetes   History of Present Illness          Diagnosis: Type 2 diabetes mellitus, date of diagnosis: 1997      Past history: The patient was initially treated with various oral hypoglycemic drugs and details are not available. About 2009 he was started on insulin and has either taken premixed insulin or Lantus once a day He had difficulty affording his Lantus insulin and prefers to take premixed insulin  Recent history:    Oral hypoglycemic drugs: Metformin ER 750 mg daily, glimepiride 1 mg daily at dinner  His A1c is now 7.2, previously 6.7,  Current blood sugar patterns and problems:  He has had fairly good blood sugars but sporadically higher recently initially in the mornings  However review of his monitor showed that the last 2 nights his blood sugars after supper were in the 70s; this is despite his evening meal being usually the largest of the day  No hypoglycemia reported  He does not know why his sugars are higher in the last week or so about the same  He is trying to walk but only couple of times a week No side effects with metformin and his renal function is adequate for taking this  Hypoglycemia : none     Glucose monitoring:  done about 1 time a day         Glucometer:  Accu-Chek      Blood Glucose readings by monitor download:   FASTING recent range 91-174 Nonfasting 72-186 with highest reading at 4:30 PM Overall average 125  Self-care:       Meals: 2-3 meals per day. Egg in am at 9-10 am or grits , rarely sandwich at lunch. His largest meal is at suppertime at about 8 pm.  Avoiding regular soft drinks To bottom Physical activity: exercise: Walking 30-40 minutes; 2-3/7  days        Dietician visit: Most recent: 02/2014               Compliance with the medical regimen:  Fair.    Wt Readings from Last 3 Encounters:    12/04/17 192 lb (87.1 kg)  08/08/17 192 lb (87.1 kg)  07/16/17 192 lb (87.1 kg)    Lab Results  Component Value Date   HGBA1C 7.2 (H) 11/29/2017   HGBA1C 6.7 (H) 07/10/2017   HGBA1C 6.8 (H) 04/13/2017    Lab Results  Component Value Date   MICROALBUR 10.4 (H) 11/29/2017   LDLCALC 68 04/13/2017   CREATININE 1.47 11/29/2017   Lab on 11/29/2017  Component Date Value Ref Range Status  . Microalb, Ur 11/29/2017 10.4* 0.0 - 1.9 mg/dL Final  . Creatinine,U 11/29/2017 59.7  mg/dL Final  . Microalb Creat Ratio 11/29/2017 17.4  0.0 - 30.0 mg/g Final  . Sodium 11/29/2017 141  135 - 145 mEq/L Final  . Potassium 11/29/2017 4.4  3.5 - 5.1 mEq/L Final  . Chloride 11/29/2017 109  96 - 112 mEq/L Final  . CO2 11/29/2017 28  19 - 32 mEq/L Final  . Glucose, Bld 11/29/2017 179* 70 - 99 mg/dL Final  . BUN 11/29/2017 26* 6 - 23 mg/dL Final  . Creatinine, Ser 11/29/2017 1.47  0.40 - 1.50 mg/dL Final  . Total Bilirubin 11/29/2017 0.5  0.2 - 1.2 mg/dL Final  . Alkaline Phosphatase  11/29/2017 63  39 - 117 U/L Final  . AST 11/29/2017 32  0 - 37 U/L Final  . ALT 11/29/2017 28  0 - 53 U/L Final  . Total Protein 11/29/2017 7.4  6.0 - 8.3 g/dL Final  . Albumin 11/29/2017 4.1  3.5 - 5.2 g/dL Final  . Calcium 11/29/2017 10.1  8.4 - 10.5 mg/dL Final  . GFR 11/29/2017 60.98  >60.00 mL/min Final  . Hgb A1c MFr Bld 11/29/2017 7.2* 4.6 - 6.5 % Final   Glycemic Control Guidelines for People with Diabetes:Non Diabetic:  <6%Goal of Therapy: <7%Additional Action Suggested:  >8%      Allergies as of 12/04/2017      Reactions   Hydrochlorothiazide Other (See Comments)   Gout , uncontrolled diabetes and renal insufficiency   Allopurinol Other (See Comments)   Renal insufficiency      Medication List        Accurate as of 12/04/17  1:41 PM. Always use your most recent med list.          ACCU-CHEK AVIVA PLUS test strip Generic drug:  glucose blood CHECK BLOOD SUGAR ONE TIME DAILY   accu-chek softclix  lancets Use to test blood sugar 2 times daily- Dx code E11.29   amLODipine 10 MG tablet Commonly known as:  NORVASC TAKE 1 TABLET EVERY DAY   aspirin EC 81 MG tablet Take 1 tablet (81 mg total) by mouth daily.   cloNIDine 0.3 MG tablet Commonly known as:  CATAPRES Take 1 tablet (0.3 mg total) by mouth 2 (two) times daily.   colchicine 0.6 MG tablet Commonly known as:  COLCRYS Take 1 tablet (0.6 mg total) by mouth daily.   glimepiride 1 MG tablet Commonly known as:  AMARYL TAKE 1 TABLET DAILY BEFORE SUPPER.   hydrALAZINE 50 MG tablet Commonly known as:  APRESOLINE Take 1 tablet (50 mg total) by mouth 3 (three) times daily.   isosorbide mononitrate 60 MG 24 hr tablet Commonly known as:  IMDUR TAKE 1 TABLET TWICE DAILY   losartan 100 MG tablet Commonly known as:  COZAAR Take 1 tablet (100 mg total) by mouth daily.   metFORMIN 750 MG 24 hr tablet Commonly known as:  GLUCOPHAGE-XR TAKE 1 TABLET BY MOUTH ONCE DAILY WITH SUPPER   metoprolol succinate 100 MG 24 hr tablet Commonly known as:  TOPROL-XL TAKE 1 TABLET TWICE DAILY, YEARLY PHYSICAL DUE IN SEPTEMBER   oxyCODONE-acetaminophen 5-325 MG tablet Commonly known as:  PERCOCET/ROXICET Take 1 tablet by mouth every 6 (six) hours as needed for severe pain.       Allergies:  Allergies  Allergen Reactions  . Hydrochlorothiazide Other (See Comments)    Gout , uncontrolled diabetes and renal insufficiency  . Allopurinol Other (See Comments)    Renal insufficiency    Past Medical History:  Diagnosis Date  . Cataract   . Colon polyp 2003   Dr [name]; F/U was to be 2008( not completed)  . CVA (cerebral infarction) 2014  . Diabetes mellitus   . Diverticulosis 2003  . Dyspnea    with exertion  . Gout   . Hypertension   . Myocardial infarction ([place]) 2014  . Pneumonia 09/29/11   Avelox X 10 days as OP  . Prostate cancer ([place])   . Renal insufficiency   . Seizures ([place])    not treated for seizure disorder;  had a seizure after stroke 2014; no seizure since then  . Stroke ([place])    at  same time he had MI  . Urinary frequency     Past Surgical History:  Procedure Laterality Date  . COLONOSCOPY W/ POLYPECTOMY  2003   no F/U ([place] discussed 12/03/12)  . INSERTION OF MESH N/A 07/16/2017   Procedure: INSERTION OF MESH;  Surgeon: [name], MD;  Location: [facility];  Service: General;  Laterality: N/A;  . PEG PLACEMENT  2014  . PEG TUBE REMOVAL  2014  . PROSTATE BIOPSY    . TRACHEOSTOMY  2014   was closed after hospital stay  . VENTRAL HERNIA REPAIR N/A 07/16/2017   Procedure: LAPAROSCOPIC VENTRAL HERNIA REPAIR WITH MESH;  Surgeon: [name], MD;  Location: [facility];  Service: General;  Laterality: N/A;  . wrist aspiration  02/16/12    monosodium urate crystals; Dr Caralyn Guile    Family History  Problem Relation Age of Onset  . Hypertension Father   . Prostate cancer Father   . Diabetes Father   . Cancer Father   . Hypertension Mother   . Diabetes Mother   . Stroke Neg Hx   . Heart disease Neg Hx   . Colon cancer Neg Hx     Social History:  reports that he quit smoking about 49 years ago. His smoking use included cigarettes. He has a 0.25 pack-year smoking history. He has never used smokeless tobacco. He reports that he does not drink alcohol or use drugs.    Review of Systems   Lipids: Normal and  not on a statin drug  Lab Results  Component Value Date   CHOL 128 04/13/2017   HDL 49.60 04/13/2017   LDLCALC 68 04/13/2017   TRIG 49.0 04/13/2017   CHOLHDL 3 04/13/2017    Eye exams: Last 2017, was told to have some problem, he does not know what    He has mild renal insufficiency of unclear etiology with variable creatinine levels, now near normal   Lab Results  Component Value Date   CREATININE 1.47 11/29/2017   CREATININE 1.45 (H) 07/10/2017   CREATININE 1.56 (H) 04/13/2017     HYPERTENSION: Currently on losartan and hydralazine as well as metoprolol This is  followed by PCP  BP Readings from Last 3 Encounters:  12/04/17 (!) 142/76  08/08/17 132/78  07/16/17 120/77      Physical Examination:  BP (!) 142/76   Pulse 96   Ht 5\' 6"  (1.676 m)   Wt 192 lb (87.1 kg)   SpO2 95%   BMI 30.99 kg/m    no  edema   ASSESSMENT/PLAN:  Diabetes type 2, uncontrolled   See history of present illness for discussion of current diabetes management, blood sugar patterns and problems identified  His A1c is fair with a level of 7.2 even though it had been below 7 the last 2 time He has been generally well controlled with low-dose metformin and Amaryl  For some reason his sugars are higher in the last week or so but only in the morning and afternoon Since his GFR is about 60 fasting readings are high along with relatively low readings after evening meal we will try him on metformin 1500 mg alone instead of Amaryl and metformin low-dose  He was advised to check his sugars consistently at different times and call if continues to be persistently high More regular walking at least 5 days per week Consistent diet  HYPERTENSION: Fair control, follow-up with PCP  Patient Instructions  Take metformin 2x daily  Check blood sugars  on waking up  3/7  Also check blood sugars about 2 hours after a meal and do this after different meals by rotation  Recommended blood sugar levels on waking up is 90-130 and about 2 hours after meal is 130-160  Please bring your blood sugar monitor to each visit, thank you  If sugar < 70 stop Glimeperide    Elayne Snare 12/04/2017, 1:41 PM

## 2017-12-07 ENCOUNTER — Other Ambulatory Visit: Payer: Self-pay

## 2017-12-07 MED ORDER — ACCU-CHEK AVIVA DEVI: 0 refills | Status: DC

## 2018-01-01 ENCOUNTER — Other Ambulatory Visit: Payer: Self-pay | Admitting: Internal Medicine

## 2018-01-17 ENCOUNTER — Other Ambulatory Visit: Payer: Self-pay | Admitting: Internal Medicine

## 2018-01-20 NOTE — Patient Instructions (Addendum)
  Test(s) ordered today. Your results will be released to [place] (or called to you) after review, usually within 72hours after test completion. If any changes need to be made, you will be notified at that same time.   Flu and pneumonia immunizations administered today.   Medications reviewed and updated.  No changes recommended at this time.  Your prescription(s) have been submitted to your pharmacy. Please take as directed and contact our office if you believe you are having problem(s) with the medication(s).  A referral was ordered for pulmonary for sleep apnea evaluation.  Please followup in 6 months

## 2018-01-20 NOTE — Progress Notes (Signed)
Subjective:    Patient ID: Corey [name], male    DOB: February 05, 1946, 72 y.o.   MRN: [id number]  HPI The patient is here for follow up.  Hypertension: He is taking his medication daily - he has been out of his medications for about one week. He is compliant with a low sodium diet.  He denies chest pain, palpitations, edema, shortness of breath and regular headaches. He is exercising regularly - walking.  He does not monitor his blood pressure at home.    Diabetes: He is taking his medication daily as prescribed. He is compliant with a diabetic diet. He is exercising regularly -some walking. He monitors his sugars and they have been running 160-170.   CKD:  He does not take any nsaids.    Fatigue:  He dreams and talks in his sleep.  He is chronically fatigue.  He does snore.  His wife states he does stop.   Gout: He thinks he has gout about twice a year.  He takes colchicine only as needed.  He does not take it often, but does need a refill.  Medications and allergies reviewed with patient and updated if appropriate.  Patient Active Problem List   Diagnosis Date Noted  . Poor balance 01/23/2017  . Ankle swelling, left 09/06/2016  . Bradycardia 01/28/2016  . Weak urinary stream 12/31/2015  . Chronic kidney disease 09/15/2015  . Ventral hernia without obstruction or gangrene 09/15/2015  . Prostate cancer ([place]) 07/25/2015  . Erectile dysfunction 05/04/2015  . Urinary frequency 05/04/2015  . 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 Abnormal finding on MRI of brain 02/14/2015  . PEA (Pulseless electrical activity) ([place]) 12/03/2012  . Diabetes ([place]) 06/30/2011  . Gout 03/05/2007  . Essential hypertension 03/05/2007    Current Outpatient Medications on File Prior to Visit  Medication Sig Dispense Refill  . ACCU-CHEK AVIVA PLUS test strip CHECK BLOOD SUGAR ONE TIME DAILY 100 each 1  . amLODipine (NORVASC) 10 MG tablet Take 1 tablet (10 mg total) by mouth daily.  Need follow up office visit for more refills. 30 tablet 0  . aspirin EC 81 MG tablet Take 1 tablet (81 mg total) by mouth daily.    . Blood Glucose Monitoring Suppl (ACCU-CHEK AVIVA) device USE METER TO CHECK BLOOD SUGAR 2 TIMES DAILY. 1 each 0  . cloNIDine (CATAPRES) 0.3 MG tablet Take 1 tablet (0.3 mg total) by mouth 2 (two) times daily. 180 tablet 1  . colchicine (COLCRYS) 0.6 MG tablet Take 1 tablet (0.6 mg total) by mouth daily. (Patient taking differently: Take 0.6 mg by mouth daily as needed (for gout attacks.). ) 180 tablet 0  . glimepiride (AMARYL) 1 MG tablet TAKE 1 TABLET DAILY BEFORE SUPPER. 90 tablet 1  . hydrALAZINE (APRESOLINE) 50 MG tablet Take 1 tablet (50 mg total) by mouth 3 (three) times daily. 90 tablet 5  . isosorbide mononitrate (IMDUR) 60 MG 24 hr tablet Take 1 tablet (60 mg total) by mouth 2 (two) times daily. -- Office visit needed for further refills 180 tablet 0  . Lancet Devices (ACCU-CHEK SOFTCLIX) lancets Use to test blood sugar 2 times daily- Dx code E11.29 1 each 3  . losartan (COZAAR) 100 MG tablet Take 1 tablet (100 mg total) by mouth daily. Need follow up office visit for more refills 30 tablet 0  . metFORMIN (GLUCOPHAGE-XR) 750 MG 24 hr tablet TAKE 1 TABLET BY MOUTH twice daily with food 180 tablet 0  .  metoprolol succinate (TOPROL-XL) 100 MG 24 hr tablet Take 1 tablet (100 mg total) by mouth 2 (two) times daily. Need follow up office visit for more refills. 60 tablet 0   No current facility-administered medications on file prior to visit.     Past Medical History:  Diagnosis Date  . Cataract   . Colon polyp 2003   Dr [name]; F/U was to be 2008( not completed)  . CVA (cerebral infarction) 2014  . Diabetes mellitus   . Diverticulosis 2003  . Dyspnea    with exertion  . Gout   . Hypertension   . Myocardial infarction ([place]) 2014  . Pneumonia 09/29/11   Avelox X 10 days as OP  . Prostate cancer ([place])   . Renal insufficiency   . Seizures ([place])     not treated for seizure disorder; had a seizure after stroke 2014; no seizure since then  . Stroke [facility])    at same time he had MI  . Urinary frequency     Past Surgical History:  Procedure Laterality Date  . COLONOSCOPY W/ POLYPECTOMY  2003   no F/U ([place] discussed 12/03/12)  . INSERTION OF MESH N/A 07/16/2017   Procedure: INSERTION OF MESH;  Surgeon: [name], MD;  Location: [facility];  Service: General;  Laterality: N/A;  . PEG PLACEMENT  2014  . PEG TUBE REMOVAL  2014  . PROSTATE BIOPSY    . TRACHEOSTOMY  2014   was closed after hospital stay  . VENTRAL HERNIA REPAIR N/A 07/16/2017   Procedure: LAPAROSCOPIC VENTRAL HERNIA REPAIR WITH MESH;  Surgeon: [name], MD;  Location: [facility];  Service: General;  Laterality: N/A;  . wrist aspiration  02/16/12    monosodium urate crystals; Dr Caralyn Guile    Social History   Socioeconomic History  . Marital status: Married    Spouse name: Not on file  . Number of children: Not on file  . Years of education: Not on file  . Highest education level: Not on file  Occupational History  . Not on file  Social Needs  . Financial resource strain: Not on file  . Food insecurity:    Worry: Not on file    Inability: Not on file  . Transportation needs:    Medical: Not on file    Non-medical: Not on file  Tobacco Use  . Smoking status: Former Smoker    Packs/day: 0.25    Years: 1.00    Pack years: 0.25    Types: Cigarettes    Last attempt to quit: 05/15/1968    Years since quitting: 49.7  . Smokeless tobacco: Never Used  . Tobacco comment: smoked [place], up to 1-2 cigarettes/ day  Substance and Sexual Activity  . Alcohol use: No    Alcohol/week: 0.0 standard drinks  . Drug use: No  . Sexual activity: Yes  Lifestyle  . Physical activity:    Days per week: Not on file    Minutes per session: Not on file  . Stress: Not on file  Relationships  . Social connections:    Talks on phone: Not on file    Gets together: Not on file      Attends religious service: Not on file    Active member of club or organization: Not on file    Attends meetings of clubs or organizations: Not on file    Relationship status: Not on file  Other Topics Concern  . Not on file  Social History Narrative   Walks three times a week, 2-3 miles, occasional walks on treadmill        Family History  Problem Relation Age of Onset  . Hypertension Father   . Prostate cancer Father   . Diabetes Father   . Cancer Father   . Hypertension Mother   . Diabetes Mother   . Stroke Neg Hx   . Heart disease Neg Hx   . Colon cancer Neg Hx     Review of Systems  Constitutional: Positive for fatigue. Negative for chills and fever.  Respiratory: Negative for cough, shortness of breath and wheezing.   Cardiovascular: Negative for chest pain, palpitations and leg swelling.  Neurological: Negative for light-headedness, numbness and headaches.  Psychiatric/Behavioral: Positive for sleep disturbance.       Objective:   Vitals:   01/21/18 1134  BP: (!) 160/82  Pulse: 66  Resp: 16  Temp: 98.4 F (36.9 C)  SpO2: 95%   BP Readings from Last 3 Encounters:  01/21/18 (!) 160/82  12/04/17 (!) 142/76  08/08/17 132/78   Wt Readings from Last 3 Encounters:  01/21/18 192 lb (87.1 kg)  12/04/17 192 lb (87.1 kg)  08/08/17 192 lb (87.1 kg)   Body mass index is 30.99 kg/m.   Physical Exam    Constitutional: Appears well-developed and well-nourished. No distress.  HENT:  Head: Normocephalic and atraumatic.  Neck: Neck supple. No tracheal deviation present. No thyromegaly present.  No cervical lymphadenopathy Cardiovascular: Normal rate, regular rhythm and normal heart sounds.   2/6 systolic murmur heard. No carotid bruit .  Mild pitting edema bilateral lower extremities edema Pulmonary/Chest: Effort normal and breath sounds normal. No respiratory distress. No has no wheezes. No rales.  Skin: Skin is warm and dry. Not diaphoretic.  Psychiatric:  Normal mood and affect. Behavior is normal.      Assessment & Plan:    See Problem List for Assessment and Plan of chronic medical problems.

## 2018-01-21 ENCOUNTER — Ambulatory Visit (INDEPENDENT_AMBULATORY_CARE_PROVIDER_SITE_OTHER): Payer: Medicare HMO | Admitting: Internal Medicine

## 2018-01-21 ENCOUNTER — Encounter: Payer: Self-pay | Admitting: Internal Medicine

## 2018-01-21 VITALS — BP 160/82 | HR 66 | Temp 98.4°F | Resp 16 | Ht 66.0 in | Wt 192.0 lb

## 2018-01-21 DIAGNOSIS — I10 Essential (primary) hypertension: Secondary | ICD-10-CM | POA: Diagnosis not present

## 2018-01-21 DIAGNOSIS — R0683 Snoring: Secondary | ICD-10-CM

## 2018-01-21 DIAGNOSIS — M10072 Idiopathic gout, left ankle and foot: Secondary | ICD-10-CM | POA: Diagnosis not present

## 2018-01-21 DIAGNOSIS — Z23 Encounter for immunization: Secondary | ICD-10-CM

## 2018-01-21 DIAGNOSIS — E1122 Type 2 diabetes mellitus with diabetic chronic kidney disease: Secondary | ICD-10-CM | POA: Diagnosis not present

## 2018-01-21 DIAGNOSIS — Z794 Long term (current) use of insulin: Secondary | ICD-10-CM

## 2018-01-21 DIAGNOSIS — N189 Chronic kidney disease, unspecified: Secondary | ICD-10-CM

## 2018-01-21 MED ORDER — METOPROLOL SUCCINATE ER 100 MG PO TB24: 100.0000 mg | ORAL_TABLET | Freq: Two times a day (BID) | ORAL | 1 refills | Status: DC

## 2018-01-21 MED ORDER — COLCHICINE 0.6 MG PO TABS: ORAL_TABLET | ORAL | 0 refills | Status: DC

## 2018-01-21 MED ORDER — HYDRALAZINE HCL 50 MG PO TABS: 50.0000 mg | ORAL_TABLET | Freq: Three times a day (TID) | ORAL | 3 refills | Status: DC

## 2018-01-21 MED ORDER — LOSARTAN POTASSIUM 100 MG PO TABS: 100.0000 mg | ORAL_TABLET | Freq: Every day | ORAL | 1 refills | Status: DC

## 2018-01-21 MED ORDER — AMLODIPINE BESYLATE 10 MG PO TABS: 10.0000 mg | ORAL_TABLET | Freq: Every day | ORAL | 1 refills | Status: DC

## 2018-01-21 MED ORDER — CLONIDINE HCL 0.3 MG PO TABS: 0.3000 mg | ORAL_TABLET | Freq: Two times a day (BID) | ORAL | 1 refills | Status: DC

## 2018-01-21 NOTE — Assessment & Plan Note (Signed)
His wife states that he does snore, has witnessed apneas and his chronic fatigue Possible sleep apnea Will refer to pulmonary for further evaluation If he does not have sleep apnea will consider other causes for fatigue Will start vitamin D 1000 units daily Can also  start vitamin B12 1000 micrograms daily

## 2018-01-21 NOTE — Assessment & Plan Note (Signed)
Blood pressure elevated here today, but he has not been taking all of his medication-he ran out of some of them 1 week ago Medications refilled Discussed importance of taking his medication daily-sounds like it was more of an insurance issue Continue regular walking Monitor blood pressure at home

## 2018-01-21 NOTE — Assessment & Plan Note (Signed)
Takes colchicine as needed only-has gout about twice a year.  Can continue as needed, but if incidence increases should start allopurinol

## 2018-01-21 NOTE — Assessment & Plan Note (Signed)
Chronic kidney disease stable Blood work done less than 2 months ago so we will not repeat today Stressed importance of taking this medication on a daily basis No  NSAIDs

## 2018-01-21 NOTE — Assessment & Plan Note (Signed)
He does not think he is taking the glimepiride, but will check when he gets home He is only taking 1/2 of the 750 mg metformin twice daily - he will start taking 1 pill twice daily Encouraged him to keep up with regular walking Has follow-up with Dr. Dwyane Dee as scheduled

## 2018-03-05 DIAGNOSIS — H47012 Ischemic optic neuropathy, left eye: Secondary | ICD-10-CM | POA: Diagnosis not present

## 2018-03-05 DIAGNOSIS — H35463 Secondary vitreoretinal degeneration, bilateral: Secondary | ICD-10-CM | POA: Diagnosis not present

## 2018-03-05 DIAGNOSIS — E113213 Type 2 diabetes mellitus with mild nonproliferative diabetic retinopathy with macular edema, bilateral: Secondary | ICD-10-CM | POA: Diagnosis not present

## 2018-03-05 DIAGNOSIS — H47019 Ischemic optic neuropathy, unspecified eye: Secondary | ICD-10-CM | POA: Diagnosis not present

## 2018-03-05 DIAGNOSIS — H43823 Vitreomacular adhesion, bilateral: Secondary | ICD-10-CM | POA: Diagnosis not present

## 2018-03-12 ENCOUNTER — Ambulatory Visit (INDEPENDENT_AMBULATORY_CARE_PROVIDER_SITE_OTHER): Payer: Medicare HMO | Admitting: Pulmonary Disease

## 2018-03-12 ENCOUNTER — Encounter: Payer: Self-pay | Admitting: Pulmonary Disease

## 2018-03-12 VITALS — BP 110/74 | HR 50 | Ht 66.0 in | Wt 188.0 lb

## 2018-03-12 DIAGNOSIS — R0683 Snoring: Secondary | ICD-10-CM | POA: Diagnosis not present

## 2018-03-12 NOTE — Patient Instructions (Signed)
Will arrange for home sleep study Will call to arrange for follow up after sleep study reviewed  

## 2018-03-12 NOTE — Progress Notes (Signed)
   Subjective:    Patient ID: Corey [name], male    DOB: 06/04/1945, 72 y.o.   MRN: [id number]  HPI    Review of Systems  Constitutional: Negative for fever and unexpected weight change.  HENT: Positive for dental problem and sore throat. Negative for congestion, ear pain, nosebleeds, postnasal drip, rhinorrhea, sinus pressure, sneezing and trouble swallowing.   Eyes: Negative for redness and itching.  Respiratory: Positive for cough, shortness of breath and wheezing. Negative for chest tightness.   Cardiovascular: Negative for palpitations and leg swelling.  Gastrointestinal: Negative for nausea and vomiting.  Genitourinary: Positive for dysuria.  Musculoskeletal: Negative for joint swelling.  Skin: Negative for rash.  Allergic/Immunologic: Negative.  Negative for environmental allergies, food allergies and immunocompromised state.  Neurological: Positive for headaches.  Hematological: Does not bruise/bleed easily.  Psychiatric/Behavioral: Negative for dysphoric mood. The patient is not nervous/anxious.        Objective:   Physical Exam        Assessment & Plan:

## 2018-03-12 NOTE — Progress Notes (Signed)
Corey [name], Corey [name], Corey [name]  Chief Complaint  Patient presents with  . sleep consult    Pt referred by Dr. Billey Gosling MD. Pt gets up 3-4 times each night to use the bathroom, wakes up not rested. Pt has daytime sleepiness, some dry cough, Corey SOB.    Constitutional:  BP 110/74 (BP Location: Left Arm, Cuff Size: Normal)   Pulse (!) 50   Ht 5\' 6"  (1.676 m)   Wt 188 lb (85.3 kg)   SpO2 98%   BMI 30.34 kg/m   Past Medical History:  Cataract, Colon polyp, CVA 2014, DM, Diverticulosis, Gout, HTN, CAD, PNA, Prostate cancer, CKD, Seizures  Brief Summary:  Corey [name] is a 72 y.o. male with snoring.  His wife has been worried about his snoring.  This has been going on for years Corey has been getting worse.  He stops breathing at night also.  He is tired during the day Corey falls asleep easily when watching TV.  He goes to sleep at 10 pm.  He falls asleep in 30 minutes.  He wakes up several times to use the bathroom.  He gets out of bed at 9 am.  He feels okay in the morning, but gets tired as the day goes on.  He denies morning headache.  He does not use anything to help him fall sleep or stay awake.  He denies sleep walking, sleep talking, bruxism, or nightmares.  There is no history of restless legs.  He denies sleep hallucinations, sleep paralysis, or cataplexy.  The Epworth score is 13 out of 24.   Physical Exam:   Appearance - well kempt   ENMT - clear nasal mucosa, midline nasal  septum, no oral exudates, no LAN, trachea midline, poor dentition, MP 4, enlarged tongue  Respiratory - normal chest wall, normal respiratory effort, no accessory muscle use, no wheeze/rales  CV - s1s2 regular rate Corey rhythm, no murmurs, no peripheral edema, radial pulses symmetric  GI - soft, non tender, no masses  Lymph - no adenopathy noted in neck Corey axillary areas  MSK - normal gait  Ext - no cyanosis, clubbing, or joint inflammation noted  Skin - no rashes,  lesions, or ulcers  Neuro - normal strength, oriented x 3  Psych - normal mood Corey affect  Discussion:  He has snoring, sleep disruption, witnessed apnea Corey daytime sleepiness.  He has hx of CAD, CVA, HTN, Corey DM.  I am concerned he could have sleep apnea.  Assessment/Plan:   Snoring with excessive daytime sleepiness. - will need to arrange for a home sleep study  Obesity. - discussed how weight can impact sleep Corey risk for sleep disordered breathing - discussed options to assist with weight loss: combination of diet modification, cardiovascular Corey strength training exercises  Cardiovascular risk. - had an extensive discussion regarding the adverse health consequences related to untreated sleep disordered breathing - specifically discussed the risks for hypertension, coronary artery disease, cardiac dysrhythmias, cerebrovascular disease, Corey diabetes - lifestyle modification discussed  Safe driving practices. - discussed how sleep disruption can increase risk of accidents, particularly when driving - safe driving practices were discussed  Therapies for obstructive sleep apnea. - if the sleep study shows significant sleep apnea, then various therapies for treatment were reviewed: CPAP, oral appliance, Corey surgical interventions   Patient Instructions  Will arrange for home sleep study Will call to arrange for follow up after sleep study reviewed    [name], MD  [place] Pager: [phone] 03/12/2018, 12:20 PM  Flow Sheet    Sleep tests:    Cardiac tests:  Echo 05/30/17 >> EF 60 to 65%, grade 3 DD   Review of Systems:  Constitutional: Negative for fever Corey unexpected weight change.  HENT: Positive for dental problem Corey sore throat. Negative for congestion, ear pain, nosebleeds, postnasal drip, rhinorrhea, sinus pressure, sneezing Corey trouble swallowing.   Eyes: Negative for redness Corey itching.  Respiratory: Positive for cough,  shortness of breath Corey wheezing. Negative for chest tightness.   Cardiovascular: Negative for palpitations Corey leg swelling.  Gastrointestinal: Negative for nausea Corey vomiting.  Genitourinary: Positive for dysuria.  Musculoskeletal: Negative for joint swelling.  Skin: Negative for rash.  Allergic/Immunologic: Negative.  Negative for environmental allergies, food allergies Corey immunocompromised state.  Neurological: Positive for headaches.  Hematological: Does not bruise/bleed easily.  Psychiatric/Behavioral: Negative for dysphoric mood. The patient is not nervous/anxious.    Medications:   Allergies as of 03/12/2018      Reactions   Hydrochlorothiazide Other (See Comments)   Gout , uncontrolled diabetes Corey renal insufficiency   Allopurinol Other (See Comments)   Renal insufficiency      Medication List        Accurate as of 03/12/18 12:20 PM. Always use your most recent med list.          ACCU-CHEK AVIVA device USE METER TO CHECK BLOOD SUGAR 2 TIMES DAILY.   ACCU-CHEK AVIVA PLUS test strip Generic drug:  glucose blood CHECK BLOOD SUGAR ONE TIME DAILY   accu-chek softclix lancets Use to test blood sugar 2 times daily- Dx code E11.29   amLODipine 10 MG tablet Commonly known as:  NORVASC Take 1 tablet (10 mg total) by mouth daily.   aspirin EC 81 MG tablet Take 1 tablet (81 mg total) by mouth daily.   cloNIDine 0.3 MG tablet Commonly known as:  CATAPRES Take 1 tablet (0.3 mg total) by mouth 2 (two) times daily.   colchicine 0.6 MG tablet Take 2 tabs once Corey then one tab one hour later as needed for gout   glimepiride 1 MG tablet Commonly known as:  AMARYL TAKE 1 TABLET DAILY BEFORE SUPPER.   hydrALAZINE 50 MG tablet Commonly known as:  APRESOLINE Take 1 tablet (50 mg total) by mouth 3 (three) times daily.   isosorbide mononitrate 60 MG 24 hr tablet Commonly known as:  IMDUR Take 1 tablet (60 mg total) by mouth 2 (two) times daily. -- Office visit needed  for further refills   losartan 100 MG tablet Commonly known as:  COZAAR Take 1 tablet (100 mg total) by mouth daily.   metFORMIN 750 MG 24 hr tablet Commonly known as:  GLUCOPHAGE-XR TAKE 1 TABLET BY MOUTH twice daily with food   metoprolol succinate 100 MG 24 hr tablet Commonly known as:  TOPROL-XL Take 1 tablet (100 mg total) by mouth 2 (two) times daily.       Past Surgical History:  He  has a past surgical history that includes Colonoscopy w/ polypectomy (2003); wrist aspiration (02/16/12); PEG placement (2014); PEG tube removal (2014); Tracheostomy (2014); Prostate biopsy; Ventral hernia repair (N/A, 07/16/2017); Corey Insertion of mesh (N/A, 07/16/2017).  Family History:  His family history includes Cancer in his father; Diabetes in his father Corey mother; Hypertension in his father Corey mother; Prostate cancer in his father.  Social History:  He  reports that he quit smoking about 49 years ago. His smoking use  included cigarettes. He has a 0.25 pack-year smoking history. He has never used smokeless tobacco. He reports that he does not drink alcohol or use drugs.

## 2018-03-18 ENCOUNTER — Other Ambulatory Visit: Payer: Medicare HMO

## 2018-03-19 ENCOUNTER — Ambulatory Visit: Payer: Medicare HMO | Admitting: Endocrinology

## 2018-03-22 DIAGNOSIS — G4733 Obstructive sleep apnea (adult) (pediatric): Secondary | ICD-10-CM | POA: Diagnosis not present

## 2018-03-25 DIAGNOSIS — H47213 Primary optic atrophy, bilateral: Secondary | ICD-10-CM | POA: Diagnosis not present

## 2018-03-25 DIAGNOSIS — H2513 Age-related nuclear cataract, bilateral: Secondary | ICD-10-CM | POA: Diagnosis not present

## 2018-03-25 DIAGNOSIS — E113293 Type 2 diabetes mellitus with mild nonproliferative diabetic retinopathy without macular edema, bilateral: Secondary | ICD-10-CM | POA: Diagnosis not present

## 2018-03-26 ENCOUNTER — Other Ambulatory Visit: Payer: Self-pay | Admitting: *Deleted

## 2018-03-26 DIAGNOSIS — E1165 Type 2 diabetes mellitus with hyperglycemia: Secondary | ICD-10-CM

## 2018-03-26 DIAGNOSIS — R0683 Snoring: Secondary | ICD-10-CM

## 2018-03-26 DIAGNOSIS — Z794 Long term (current) use of insulin: Secondary | ICD-10-CM

## 2018-03-27 ENCOUNTER — Encounter: Payer: Self-pay | Admitting: Pulmonary Disease

## 2018-03-27 ENCOUNTER — Telehealth: Payer: Self-pay | Admitting: Pulmonary Disease

## 2018-03-27 DIAGNOSIS — G4733 Obstructive sleep apnea (adult) (pediatric): Secondary | ICD-10-CM

## 2018-03-27 HISTORY — DX: Obstructive sleep apnea (adult) (pediatric): G47.33

## 2018-03-27 NOTE — Telephone Encounter (Signed)
HST 03/22/18 >> AHI 26.6, SpO2 low 81%   Please let him know that the sleep study showed moderate obstructive sleep apnea.  Please arrange for ROV with a nurse practitioner to review treatment options.

## 2018-04-01 NOTE — Telephone Encounter (Signed)
Attempted to call patient today regarding results. I did not receive an answer at time of call. I have left a voicemail message for pt to return call. X1  

## 2018-04-02 NOTE — Telephone Encounter (Signed)
Called and spoke with patient regarding results.  Informed the patient of results and recommendations today. Scheduled ov with TN 04/10/18 at 9:30am Pt verbalized understanding and denied any questions or concerns at this time.  Nothing further needed.

## 2018-04-05 ENCOUNTER — Telehealth: Payer: Self-pay | Admitting: Pulmonary Disease

## 2018-04-05 NOTE — Telephone Encounter (Signed)
Advised pt of results. Pt understood and nothing further is needed.   

## 2018-04-09 ENCOUNTER — Other Ambulatory Visit: Payer: Self-pay | Admitting: Internal Medicine

## 2018-04-10 ENCOUNTER — Ambulatory Visit: Payer: Medicare HMO | Admitting: Nurse Practitioner

## 2018-04-10 ENCOUNTER — Encounter: Payer: Self-pay | Admitting: Nurse Practitioner

## 2018-04-10 VITALS — BP 130/68 | HR 64 | Ht 66.0 in | Wt 188.4 lb

## 2018-04-10 DIAGNOSIS — G4733 Obstructive sleep apnea (adult) (pediatric): Secondary | ICD-10-CM | POA: Diagnosis not present

## 2018-04-10 NOTE — Progress Notes (Signed)
@Patient  ID: Corey [name], male    DOB: 1946-05-14, 72 y.o.   MRN: [id number]  Chief Complaint  Patient presents with  . Results    HST    Referring provider: [name], MD  HPI  72 year old male with moderate sleep apnea followed by Dr. Halford Chessman.  Tests:  HST 03/22/18 - AHI 26.6, SPO2 low 81%  Echo 05/30/17 >> EF 60 to 65%, grade 3 DD  Results of the Epworth flowsheet 03/12/2018  Sitting and reading 2  Watching TV 3  Sitting, inactive in a public place (e.g. a theatre or a meeting) 0  As a passenger in a car for an hour without a break 2  Lying down to rest in the afternoon when circumstances permit 3  Sitting and talking to someone 1  Sitting quietly after a lunch without alcohol 1  In a car, while stopped for a few minutes in traffic 1  Total score 13     OV 04/10/18 - follow up sleep study Patient presents for a follow up after sleep study and to discuss treatment options. States that he snores. He falls asleep easily while watching TV. He goes to sleep around 10 pm and wakes at 9 am. Gets up several times throughout the night to use the bathroom. He denies sleep walking or nightmares. He is wanting to try CPAP for treatment. He denies and chest pain, fever, or edema.     Allergies  Allergen Reactions  . Hydrochlorothiazide Other (See Comments)    Gout , uncontrolled diabetes and renal insufficiency  . Allopurinol Other (See Comments)    Renal insufficiency    Immunization History  Administered Date(s) Administered  . Influenza Split 03/02/2011  . Influenza Whole 01/23/2013  . Influenza, High Dose Seasonal PF 01/28/2016, 01/23/2017, 01/21/2018  . Influenza,inj,Quad PF,6+ Mos 02/23/2014, 02/24/2015  . Pneumococcal Conjugate-13 05/04/2015  . Pneumococcal Polysaccharide-23 10/02/2012, 01/21/2018  . Tdap 12/15/2010, 09/21/2011    Past Medical History:  Diagnosis Date  . Cataract   . Colon polyp 2003   Dr [name]; F/U was to be 2008( not completed)    . CVA (cerebral infarction) 2014  . Diabetes mellitus   . Diverticulosis 2003  . Dyspnea    with exertion  . Gout   . Hypertension   . Myocardial infarction ([place]) 2014  . OSA (obstructive sleep apnea) 03/27/2018  . Pneumonia 09/29/11   Avelox X 10 days as OP  . Prostate cancer ([place])   . Renal insufficiency   . Seizures ([place])    not treated for seizure disorder; had a seizure after stroke 2014; no seizure since then  . Stroke [facility])    at same time he had MI  . Urinary frequency     Tobacco History: Social History   Tobacco Use  Smoking Status Former Smoker  . Packs/day: 0.25  . Years: 1.00  . Pack years: 0.25  . Types: Cigarettes  . Last attempt to quit: 05/15/1968  . Years since quitting: 49.9  Smokeless Tobacco Never Used  Tobacco Comment   smoked [place], up to 1-2 cigarettes/ day   Counseling given: Yes Comment: smoked [place], up to 1-2 cigarettes/ day   Outpatient Encounter Medications as of 04/10/2018  Medication Sig  . ACCU-CHEK AVIVA PLUS test strip CHECK BLOOD SUGAR ONE TIME DAILY  . amLODipine (NORVASC) 10 MG tablet Take 1 tablet (10 mg total) by mouth daily.  Marland Kitchen aspirin EC 81 MG tablet Take  1 tablet (81 mg total) by mouth daily.  . Blood Glucose Monitoring Suppl (ACCU-CHEK AVIVA) device USE METER TO CHECK BLOOD SUGAR 2 TIMES DAILY.  . cloNIDine (CATAPRES) 0.3 MG tablet Take 1 tablet (0.3 mg total) by mouth 2 (two) times daily.  . colchicine (COLCRYS) 0.6 MG tablet Take 2 tabs once and then one tab one hour later as needed for gout  . glimepiride (AMARYL) 1 MG tablet TAKE 1 TABLET DAILY BEFORE SUPPER.  . hydrALAZINE (APRESOLINE) 50 MG tablet Take 1 tablet (50 mg total) by mouth 3 (three) times daily.  . isosorbide mononitrate (IMDUR) 60 MG 24 hr tablet Take 1 tablet (60 mg total) by mouth 2 (two) times daily. -- Office visit needed for further refills  . Lancet Devices (ACCU-CHEK SOFTCLIX) lancets Use to test blood sugar 2 times daily- Dx code E11.29   . losartan (COZAAR) 100 MG tablet Take 1 tablet (100 mg total) by mouth daily.  . metFORMIN (GLUCOPHAGE-XR) 750 MG 24 hr tablet TAKE 1 TABLET BY MOUTH twice daily with food  . metoprolol succinate (TOPROL-XL) 100 MG 24 hr tablet Take 1 tablet (100 mg total) by mouth 2 (two) times daily.   No facility-administered encounter medications on file as of 04/10/2018.      Review of Systems  Review of Systems  Constitutional: Negative.  Negative for chills and fever.  HENT: Negative.  Negative for congestion.   Respiratory: Negative for cough, shortness of breath and wheezing.   Cardiovascular: Negative.  Negative for chest pain, palpitations and leg swelling.  Gastrointestinal: Negative.   Allergic/Immunologic: Negative.   Neurological: Negative.   Psychiatric/Behavioral: Negative.        Physical Exam  BP 130/68 (BP Location: Left Arm, Patient Position: Sitting, Cuff Size: Normal)   Pulse 64   Ht 5\' 6"  (1.676 m)   Wt 188 lb 6.4 oz (85.5 kg)   SpO2 100%   BMI 30.41 kg/m   Wt Readings from Last 5 Encounters:  04/10/18 188 lb 6.4 oz (85.5 kg)  03/12/18 188 lb (85.3 kg)  01/21/18 192 lb (87.1 kg)  12/04/17 192 lb (87.1 kg)  08/08/17 192 lb (87.1 kg)     Physical Exam  Constitutional: He is oriented to person, place, and time. He appears well-developed and well-nourished. No distress.  Cardiovascular: Normal rate and regular rhythm.  Pulmonary/Chest: Effort normal and breath sounds normal. No respiratory distress. He has no wheezes.  Musculoskeletal: He exhibits no edema.  Neurological: He is alert and oriented to person, place, and time.  Skin: Skin is warm and dry.  Psychiatric: He has a normal mood and affect.  Nursing note and vitals reviewed.      Assessment & Plan:   OSA (obstructive sleep apnea) Discussed treatment options with patient He wants to try CPAP  Patient Instructions  Will order CPAP auto set 5-15 cmH20 - mask of choice Continue current  medications Goal of 4 hours or more usage per night Maintain healthy weight Do not drive if drowsy Follow up with Dr. Halford Chessman or me in 1 month after starting CPAP or sooner if needed        Fenton Foy, NP 04/10/2018

## 2018-04-10 NOTE — Assessment & Plan Note (Signed)
Discussed treatment options with patient Corey [name] wants to try CPAP  Patient Instructions  Will order CPAP auto set 5-15 cmH20 - mask of choice Continue current medications Goal of 4 hours or more usage per night Maintain healthy weight Do not drive if drowsy Follow up with Dr. Halford Chessman or me in 1 month after starting CPAP or sooner if needed

## 2018-04-10 NOTE — Patient Instructions (Signed)
Will order CPAP auto set 5-15 cmH20 - mask of choice Continue current medications Goal of 4 hours or more usage per night Maintain healthy weight Do not drive if drowsy Follow up with Dr. Halford Chessman or me in 1 month after starting CPAP or sooner if needed

## 2018-04-18 DIAGNOSIS — H2512 Age-related nuclear cataract, left eye: Secondary | ICD-10-CM | POA: Diagnosis not present

## 2018-04-23 DIAGNOSIS — J188 Other pneumonia, unspecified organism: Secondary | ICD-10-CM | POA: Diagnosis not present

## 2018-04-23 DIAGNOSIS — G4733 Obstructive sleep apnea (adult) (pediatric): Secondary | ICD-10-CM | POA: Diagnosis not present

## 2018-04-23 DIAGNOSIS — I509 Heart failure, unspecified: Secondary | ICD-10-CM | POA: Diagnosis not present

## 2018-04-23 NOTE — Progress Notes (Signed)
Reviewed and agree with assessment/plan.   Kamika Goodloe, MD [place] [facility] 05/10/2016, 12:24 PM Pager:  [phone]  

## 2018-04-24 ENCOUNTER — Other Ambulatory Visit: Payer: Self-pay | Admitting: Internal Medicine

## 2018-05-24 DIAGNOSIS — I509 Heart failure, unspecified: Secondary | ICD-10-CM | POA: Diagnosis not present

## 2018-05-24 DIAGNOSIS — J188 Other pneumonia, unspecified organism: Secondary | ICD-10-CM | POA: Diagnosis not present

## 2018-05-24 DIAGNOSIS — G4733 Obstructive sleep apnea (adult) (pediatric): Secondary | ICD-10-CM | POA: Diagnosis not present

## 2018-05-27 DIAGNOSIS — Z961 Presence of intraocular lens: Secondary | ICD-10-CM | POA: Diagnosis not present

## 2018-05-27 LAB — HM DIABETES EYE EXAM

## 2018-06-21 ENCOUNTER — Other Ambulatory Visit: Payer: Self-pay | Admitting: Endocrinology

## 2018-06-21 ENCOUNTER — Other Ambulatory Visit: Payer: Self-pay | Admitting: Internal Medicine

## 2018-06-24 DIAGNOSIS — G4733 Obstructive sleep apnea (adult) (pediatric): Secondary | ICD-10-CM | POA: Diagnosis not present

## 2018-06-24 DIAGNOSIS — J188 Other pneumonia, unspecified organism: Secondary | ICD-10-CM | POA: Diagnosis not present

## 2018-06-24 DIAGNOSIS — I509 Heart failure, unspecified: Secondary | ICD-10-CM | POA: Diagnosis not present

## 2018-06-28 ENCOUNTER — Other Ambulatory Visit: Payer: Self-pay

## 2018-06-28 ENCOUNTER — Telehealth: Payer: Self-pay | Admitting: Endocrinology

## 2018-06-28 MED ORDER — METFORMIN HCL ER 750 MG PO TB24: ORAL_TABLET | ORAL | 0 refills | Status: DC

## 2018-06-28 NOTE — Telephone Encounter (Signed)
Rx sent 

## 2018-06-28 NOTE — Telephone Encounter (Signed)
MEDICATION: metFORMIN (GLUCOPHAGE-XR) 750 MG 24 hr tablet  PHARMACY:  [facility] [place] - [address]  IS THIS A 90 DAY SUPPLY : yes  IS PATIENT OUT OF MEDICATION: yes  IF NOT; HOW MUCH IS LEFT:   LAST APPOINTMENT DATE: @2 /11/2018  NEXT APPOINTMENT DATE:@3 /25/2020  DO WE HAVE YOUR PERMISSION TO LEAVE A DETAILED MESSAGE:  OTHER COMMENTS:    **Let patient know to contact pharmacy at the end of the day to make sure medication is ready. **  ** Please notify patient to allow 48-72 hours to process**  **Encourage patient to contact the pharmacy for refills or they can request refills through [facility]**

## 2018-07-01 ENCOUNTER — Ambulatory Visit (INDEPENDENT_AMBULATORY_CARE_PROVIDER_SITE_OTHER): Payer: Medicare HMO | Admitting: Nurse Practitioner

## 2018-07-01 ENCOUNTER — Encounter: Payer: Self-pay | Admitting: Nurse Practitioner

## 2018-07-01 VITALS — BP 118/64 | HR 60 | Ht 66.0 in | Wt 187.0 lb

## 2018-07-01 DIAGNOSIS — G4733 Obstructive sleep apnea (adult) (pediatric): Secondary | ICD-10-CM

## 2018-07-01 NOTE — Assessment & Plan Note (Signed)
Patient presents today for CPAP follow-up.  He has been compliant with his CPAP per compliance report.  I am concerned because his AHI is still at 20.1.  He complains of issues with current nasal pillow mask.  We will order a mask fitting and trial a full facemask.  Patient states that he is willing to trial full facemask.  Patient Instructions  Will order mask fitting - would like to trial full face mask if patient can tolerate Patient continues to benefit from CPAP with good compliance and control documented Continue CPAP at current settings Continue current medications Goal of 4 hours or more usage per night Maintain healthy weight Do not drive if drowsy Follow up with me in 1 month or sooner if needed

## 2018-07-01 NOTE — Progress Notes (Signed)
@Patient  ID: Corey [name], male    DOB: [birth year], 73 y.o.   MRN: [id number]  Chief Complaint  Patient presents with  . Follow-up    CPAP - Obstructive Sleep Apnea    Referring provider: [name], MD  HPI  73 year old male with moderate sleep apnea followed by Dr. Halford Chessman.  Tests:  HST 03/22/18 - AHI 26.6, SPO2 low 81%  Echo 05/30/17 >> EF 60 to 65%, grade 3 DD  Results of the Epworth flowsheet 03/12/2018  Sitting and reading 2  Watching TV 3  Sitting, inactive in a public place (e.g. a theatre or a meeting) 0  As a passenger in a car for an hour without a break 2  Lying down to rest in the afternoon when circumstances permit 3  Sitting and talking to someone 1  Sitting quietly after a lunch without alcohol 1  In a car, while stopped for a few minutes in traffic 1  Total score 13    OV 07/01/18  - follow up new CPAP Presents today for follow-up after receiving a new CPAP machine.  He states that he has been compliant with his CPAP and wears it nightly.  He is to bed at around midnight and wakes up at around 5 or 6 AM.  He is currently using nasal pillow mask and states that he has been having issues with that.  He states that he feels that it does not fit properly.  He states that he does feel better during the day after using his CPAP at night.  He feels like he is less drowsy during the day.  He denies any nightmares or sleepwalking.  CPAP compliance report 05/29/18 - 06/27/18: Usage days 30/30 (100%), Average usage 5 hours 30 minutes, CPAP AutoSet 5-15 cmH20, AHI: 20.1   Allergies  Allergen Reactions  . Hydrochlorothiazide Other (See Comments)    Gout , uncontrolled diabetes and renal insufficiency  . Allopurinol Other (See Comments)    Renal insufficiency    Immunization History  Administered Date(s) Administered  . Influenza Split 03/02/2011  . Influenza Whole 01/23/2013  . Influenza, High Dose Seasonal PF 01/28/2016, 01/23/2017, 01/21/2018  .  Influenza,inj,Quad PF,6+ Mos 02/23/2014, 02/24/2015  . Pneumococcal Conjugate-13 05/04/2015  . Pneumococcal Polysaccharide-23 10/02/2012, 01/21/2018  . Tdap 12/15/2010, 09/21/2011    Past Medical History:  Diagnosis Date  . Cataract   . Colon polyp 2003   Dr [name]; F/U was to be 2008( not completed)  . CVA (cerebral infarction) 2014  . Diabetes mellitus   . Diverticulosis 2003  . Dyspnea    with exertion  . Gout   . Hypertension   . Myocardial infarction ([place]) 2014  . OSA (obstructive sleep apnea) 03/27/2018  . Pneumonia 09/29/11   Avelox X 10 days as OP  . Prostate cancer ([place])   . Renal insufficiency   . Seizures ([place])    not treated for seizure disorder; had a seizure after stroke 2014; no seizure since then  . Stroke [facility])    at same time he had MI  . Urinary frequency     Tobacco History: Social History   Tobacco Use  Smoking Status Former Smoker  . Packs/day: 0.25  . Years: 1.00  . Pack years: 0.25  . Types: Cigarettes  . Last attempt to quit: 05/15/1968  . Years since quitting: 50.1  Smokeless Tobacco Never Used  Tobacco Comment   smoked [place], up to 1-2 cigarettes/ day  Counseling given: Not Answered Comment: smoked [place], up to 1-2 cigarettes/ day   Outpatient Encounter Medications as of 07/01/2018  Medication Sig  . ACCU-CHEK AVIVA PLUS test strip CHECK BLOOD SUGAR ONE TIME DAILY  . amLODipine (NORVASC) 10 MG tablet TAKE 1 TABLET EVERY DAY  . aspirin EC 81 MG tablet Take 1 tablet (81 mg total) by mouth daily.  . Blood Glucose Monitoring Suppl (ACCU-CHEK AVIVA) device USE METER TO CHECK BLOOD SUGAR 2 TIMES DAILY.  . cloNIDine (CATAPRES) 0.3 MG tablet Take 1 tablet (0.3 mg total) by mouth 2 (two) times daily.  . colchicine (COLCRYS) 0.6 MG tablet Take 2 tabs once and then one tab one hour later as needed for gout  . glimepiride (AMARYL) 1 MG tablet TAKE 1 TABLET DAILY BEFORE SUPPER.  . hydrALAZINE (APRESOLINE) 50 MG tablet Take 1 tablet  (50 mg total) by mouth 3 (three) times daily.  . isosorbide mononitrate (IMDUR) 60 MG 24 hr tablet Take 1 tablet (60 mg total) by mouth 2 (two) times daily.  [name] Devices (ACCU-CHEK SOFTCLIX) lancets Use to test blood sugar 2 times daily- Dx code E11.29  . losartan (COZAAR) 100 MG tablet Take 1 tablet (100 mg total) by mouth daily.  . metFORMIN (GLUCOPHAGE-XR) 750 MG 24 hr tablet TAKE 1 TABLET BY MOUTH twice daily with food  . metoprolol succinate (TOPROL-XL) 100 MG 24 hr tablet Take 1 tablet (100 mg total) by mouth 2 (two) times daily.  Marland Kitchen oxybutynin (DITROPAN-XL) 10 MG 24 hr tablet Take 10 mg by mouth as needed.   No facility-administered encounter medications on file as of 07/01/2018.      Review of Systems  Review of Systems  Constitutional: Negative.  Negative for chills and fever.  HENT: Negative.   Respiratory: Negative for cough, shortness of breath and wheezing.   Cardiovascular: Negative.  Negative for chest pain, palpitations and leg swelling.  Gastrointestinal: Negative.   Allergic/Immunologic: Negative.   Neurological: Negative.   Psychiatric/Behavioral: Negative.        Physical Exam  BP 118/64 (BP Location: Right Arm, Patient Position: Sitting, Cuff Size: Normal)   Pulse 60   Ht 5\' 6"  (1.676 m)   Wt 187 lb (84.8 kg)   SpO2 100%   BMI 30.18 kg/m   Wt Readings from Last 5 Encounters:  07/01/18 187 lb (84.8 kg)  04/10/18 188 lb 6.4 oz (85.5 kg)  03/12/18 188 lb (85.3 kg)  01/21/18 192 lb (87.1 kg)  12/04/17 192 lb (87.1 kg)     Physical Exam Vitals signs and nursing note reviewed.  Constitutional:      General: He is not in acute distress.    Appearance: He is well-developed.  Cardiovascular:     Rate and Rhythm: Normal rate and regular rhythm.  Pulmonary:     Effort: Pulmonary effort is normal. No respiratory distress.     Breath sounds: Normal breath sounds. No wheezing or rhonchi.  Skin:    General: Skin is warm and dry.  Neurological:      Mental Status: He is alert and oriented to person, place, and time.       Assessment & Plan:   OSA (obstructive sleep apnea) Patient presents today for CPAP follow-up.  He has been compliant with his CPAP per compliance report.  I am concerned because his AHI is still at 20.1.  He complains of issues with current nasal pillow mask.  We will order a mask fitting and trial a full  facemask.  Patient states that he is willing to trial full facemask.  Patient Instructions  Will order mask fitting - would like to trial full face mask if patient can tolerate Patient continues to benefit from CPAP with good compliance and control documented Continue CPAP at current settings Continue current medications Goal of 4 hours or more usage per night Maintain healthy weight Do not drive if drowsy Follow up with me in 1 month or sooner if needed         Fenton Foy, NP 07/01/2018

## 2018-07-01 NOTE — Patient Instructions (Signed)
Will order mask fitting - would like to trial full face mask if patient can tolerate Patient continues to benefit from CPAP with good compliance and control documented Continue CPAP at current settings Continue current medications Goal of 4 hours or more usage per night Maintain healthy weight Do not drive if drowsy Follow up with me in 1 month or sooner if needed

## 2018-07-08 NOTE — Progress Notes (Signed)
Reviewed and agree with assessment/plan.   Brightyn Mozer, MD [place] [facility] 05/10/2016, 12:24 PM Pager:  [phone]  

## 2018-07-11 ENCOUNTER — Telehealth: Payer: Self-pay | Admitting: Nurse Practitioner

## 2018-07-11 NOTE — Telephone Encounter (Signed)
ATC pt, no answer. Left message for pt to call back.  

## 2018-07-12 NOTE — Telephone Encounter (Signed)
Spoke with patient. He stated that [place] ordered a full face mask for him on 07/01/2018. Per patient, [facility] will not cover this mask. Did not give him any alternatives. He wants to see why [facility] will not cover his mask.

## 2018-07-12 NOTE — Telephone Encounter (Signed)
Called [facility] and left a message with Sonia Baller to check on the status of this.

## 2018-07-12 NOTE — Telephone Encounter (Signed)
LMTCB x2 for pt 

## 2018-07-16 NOTE — Telephone Encounter (Signed)
Routing to triage for follow up as [facility] is not open yet.

## 2018-07-16 NOTE — Telephone Encounter (Signed)
I called [facility] and was on hold for several minutes. Will call back later.

## 2018-07-17 NOTE — Telephone Encounter (Signed)
Attempted to call Sonia Baller with [facility] to follow up on cpap mask for pt. Tried to call x3 times but each time tried to call, immediately got a busy signal. Will try to call back later.

## 2018-07-18 NOTE — Telephone Encounter (Signed)
Called  [facility] was placed on hold for 6 minutes. Will call back.

## 2018-07-19 NOTE — Telephone Encounter (Signed)
I sent Melissa a staff message regarding this since we are unable to reach someone by phone. Will await response.

## 2018-07-22 NOTE — Patient Instructions (Addendum)
Follow up with cardiology.   Make an appointment with your foot doctor.    Tests ordered today. Your results will be released to [place] (or called to you) after review, usually within 72hours after test completion. If any changes need to be made, you will be notified at that same time.   Medications reviewed and updated.  Changes include :   none  Your prescription(s) have been submitted to your pharmacy. Please take as directed and contact our office if you believe you are having problem(s) with the medication(s).  A referral was ordered for Neurology was ordered  Please followup in 6 months

## 2018-07-22 NOTE — Telephone Encounter (Signed)
I have checked Corey [name]'s inbox. There is no staff message from [place].

## 2018-07-22 NOTE — Telephone Encounter (Signed)
Called Sonia Baller and was unable to reach. Left message to give [place] a call back.

## 2018-07-22 NOTE — Progress Notes (Signed)
Subjective:    Patient ID: Corey [name], male    DOB: [birth year], 73 y.o.   MRN: [id number]  HPI The patient is here for follow up.  He is here with his wife.    CAD, Hypertension: He is taking his medication daily. He is compliant with a low sodium diet.  He denies chest pain, palpitations, edema,  and regular headaches. He is exercising - walks 3-4 miles twice a week.  He does not monitor his blood pressure at home.    Diabetes: He is taking his medication daily as prescribed. He is compliant with a diabetic diet. He is exercising some. He checks his feet daily and denies foot lesions. He is up-to-date with an ophthalmology examination.   CKD: He tries to drink water throughout the day.  He denies taking any NSAIDs.  Gout: He takes colchicine as needed only.  He thinks he has approximately 2 flares a year.  He denies gout symptoms since he was here last.    When she is driving and her husband is in the passenger seat she has noticed that his left hand will twitch.  He has not noticed that.  She has not noticed any other time and he has never noticed that so is unsure if it occurs at any other time.  She denies any tremor or twitching of the right hand or head.  Slurred speech: On occasion she will notice slurred speech.  He is not aware of this and not able to provide further history.  ?  Depression: She is also concerned that he may be depressed.  She states that sometimes for 2-3 hours use in the room without TV, without reading and just sit there.  She does not think that is normal and wonders if he is depressed.  She has asked him and he denies it.  He denies it here today.  He did not provide an explanation for why he does this.  Medications and allergies reviewed with patient and updated if appropriate.  Patient Active Problem List   Diagnosis Date Noted  . Slurred speech 07/23/2018  . Muscle twitching 07/23/2018  . OSA (obstructive sleep apnea) 03/27/2018  . Snores  01/21/2018  . Poor balance 01/23/2017  . Ankle swelling, left 09/06/2016  . Bradycardia 01/28/2016  . Weak urinary stream 12/31/2015  . Chronic kidney disease 09/15/2015  . Ventral hernia without obstruction or gangrene 09/15/2015  . Prostate cancer ([place]) 07/25/2015  . Erectile dysfunction 05/04/2015  . Urinary frequency 05/04/2015  . 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 Abnormal finding on MRI of brain 02/14/2015  . PEA (Pulseless electrical activity) ([place]) 12/03/2012  . Diabetes ([place]) 06/30/2011  . Gout 03/05/2007  . Essential hypertension 03/05/2007    Current Outpatient Medications on File Prior to Visit  Medication Sig Dispense Refill  . ACCU-CHEK AVIVA PLUS test strip CHECK BLOOD SUGAR ONE TIME DAILY 100 each 1  . aspirin EC 81 MG tablet Take 1 tablet (81 mg total) by mouth daily.    . Blood Glucose Monitoring Suppl (ACCU-CHEK AVIVA) device USE METER TO CHECK BLOOD SUGAR 2 TIMES DAILY. 1 each 0  . Lancet Devices (ACCU-CHEK SOFTCLIX) lancets Use to test blood sugar 2 times daily- Dx code E11.29 1 each 3  . metFORMIN (GLUCOPHAGE-XR) 750 MG 24 hr tablet TAKE 1 TABLET BY MOUTH twice daily with food 180 tablet 0  . oxybutynin (DITROPAN-XL) 10 MG 24 hr tablet Take 10 mg by mouth  as needed.     No current facility-administered medications on file prior to visit.     Past Medical History:  Diagnosis Date  . Cataract   . Colon polyp 2003   Dr [name]; F/U was to be 2008( not completed)  . CVA (cerebral infarction) 2014  . Diabetes mellitus   . Diverticulosis 2003  . Dyspnea    with exertion  . Gout   . Hypertension   . Myocardial infarction ([place]) 2014  . OSA (obstructive sleep apnea) 03/27/2018  . Pneumonia 09/29/11   Avelox X 10 days as OP  . Prostate cancer ([place])   . Renal insufficiency   . Seizures ([place])    not treated for seizure disorder; had a seizure after stroke 2014; no seizure since then  . Stroke [facility])    at same time he had MI    . Urinary frequency     Past Surgical History:  Procedure Laterality Date  . COLONOSCOPY W/ POLYPECTOMY  2003   no F/U ([place] discussed 12/03/12)  . INSERTION OF MESH N/A 07/16/2017   Procedure: INSERTION OF MESH;  Surgeon: [name], MD;  Location: [facility];  Service: General;  Laterality: N/A;  . PEG PLACEMENT  2014  . PEG TUBE REMOVAL  2014  . PROSTATE BIOPSY    . TRACHEOSTOMY  2014   was closed after hospital stay  . VENTRAL HERNIA REPAIR N/A 07/16/2017   Procedure: LAPAROSCOPIC VENTRAL HERNIA REPAIR WITH MESH;  Surgeon: [name], MD;  Location: [facility];  Service: General;  Laterality: N/A;  . wrist aspiration  02/16/12    monosodium urate crystals; Dr Caralyn Guile    Social History   Socioeconomic History  . Marital status: Married    Spouse name: Not on file  . Number of children: Not on file  . Years of education: Not on file  . Highest education level: Not on file  Occupational History  . Not on file  Social Needs  . Financial resource strain: Not on file  . Food insecurity:    Worry: Not on file    Inability: Not on file  . Transportation needs:    Medical: Not on file    Non-medical: Not on file  Tobacco Use  . Smoking status: Former Smoker    Packs/day: 0.25    Years: 1.00    Pack years: 0.25    Types: Cigarettes    Last attempt to quit: 05/15/1968    Years since quitting: 50.2  . Smokeless tobacco: Never Used  . Tobacco comment: smoked [place], up to 1-2 cigarettes/ day  Substance and Sexual Activity  . Alcohol use: No    Alcohol/week: 0.0 standard drinks  . Drug use: No  . Sexual activity: Yes  Lifestyle  . Physical activity:    Days per week: Not on file    Minutes per session: Not on file  . Stress: Not on file  Relationships  . Social connections:    Talks on phone: Not on file    Gets together: Not on file    Attends religious service: Not on file    Active member of club or organization: Not on file    Attends meetings of clubs or  organizations: Not on file    Relationship status: Not on file  Other Topics Concern  . Not on file  Social History Narrative   Walks three times a week, 2-3 miles, occasional walks on treadmill  Family History  Problem Relation Age of Onset  . Hypertension Father   . Prostate cancer Father   . Diabetes Father   . Cancer Father   . Hypertension Mother   . Diabetes Mother   . Stroke Neg Hx   . Heart disease Neg Hx   . Colon cancer Neg Hx     Review of Systems  Constitutional: Negative for chills (feels cold at times) and fever.  Respiratory: Positive for shortness of breath (DOE sometimes with walking a lot). Negative for cough and wheezing.   Cardiovascular: Negative for chest pain, palpitations and leg swelling.  Neurological: Positive for headaches (occasional). Negative for light-headedness and numbness.       Objective:   Vitals:   07/23/18 1053  BP: 120/66  Pulse: (!) 46  Resp: 16  Temp: 97.8 F (36.6 C)  SpO2: 99%   BP Readings from Last 3 Encounters:  07/23/18 120/66  07/01/18 118/64  04/10/18 130/68   Wt Readings from Last 3 Encounters:  07/23/18 190 lb 6.4 oz (86.4 kg)  07/01/18 187 lb (84.8 kg)  04/10/18 188 lb 6.4 oz (85.5 kg)   Body mass index is 31.68 kg/m.   Physical Exam    Constitutional: Appears well-developed and well-nourished. No distress.  HENT:  Head: Normocephalic and atraumatic.  Neck: Neck supple. No tracheal deviation present. No thyromegaly present.  No cervical lymphadenopathy Cardiovascular: Normal rate, regular rhythm and normal heart sounds.   No murmur heard. No carotid bruit .  No edema Pulmonary/Chest: Effort normal and breath sounds normal. No respiratory distress. No has no wheezes. No rales.  Neurological:  CN II-XII intact, no slurred speech, normal sensation and strength all extremities Skin: Skin is warm and dry. Not diaphoretic.  Psychiatric: Normal mood and affect. Behavior is normal.    Diabetic Foot  Exam - Simple   Simple Foot Form Diabetic Foot exam was performed with the following findings:  Yes 07/23/2018 12:35 PM  Visual Inspection See comments:  Yes Sensation Testing Intact to touch and monofilament testing bilaterally:  Yes Pulse Check Posterior Tibialis and Dorsalis pulse intact bilaterally:  Yes Comments Bilateral bunions medial first MTPs, hammertoes bilateral second toes, thickened, discolored and elongated toenails       Assessment & Plan:    See Problem List for Assessment and Plan of chronic medical problems.

## 2018-07-23 ENCOUNTER — Other Ambulatory Visit (INDEPENDENT_AMBULATORY_CARE_PROVIDER_SITE_OTHER): Payer: Medicare HMO

## 2018-07-23 ENCOUNTER — Ambulatory Visit (INDEPENDENT_AMBULATORY_CARE_PROVIDER_SITE_OTHER): Payer: Medicare HMO | Admitting: Internal Medicine

## 2018-07-23 ENCOUNTER — Encounter: Payer: Self-pay | Admitting: Internal Medicine

## 2018-07-23 VITALS — BP 120/66 | HR 46 | Temp 97.8°F | Resp 16 | Ht 65.0 in | Wt 190.4 lb

## 2018-07-23 DIAGNOSIS — E1122 Type 2 diabetes mellitus with diabetic chronic kidney disease: Secondary | ICD-10-CM

## 2018-07-23 DIAGNOSIS — R253 Fasciculation: Secondary | ICD-10-CM | POA: Diagnosis not present

## 2018-07-23 DIAGNOSIS — M10072 Idiopathic gout, left ankle and foot: Secondary | ICD-10-CM

## 2018-07-23 DIAGNOSIS — N189 Chronic kidney disease, unspecified: Secondary | ICD-10-CM

## 2018-07-23 DIAGNOSIS — R4781 Slurred speech: Secondary | ICD-10-CM | POA: Insufficient documentation

## 2018-07-23 DIAGNOSIS — G4733 Obstructive sleep apnea (adult) (pediatric): Secondary | ICD-10-CM | POA: Diagnosis not present

## 2018-07-23 DIAGNOSIS — I10 Essential (primary) hypertension: Secondary | ICD-10-CM | POA: Diagnosis not present

## 2018-07-23 DIAGNOSIS — M1A9XX0 Chronic gout, unspecified, without tophus (tophi): Secondary | ICD-10-CM | POA: Diagnosis not present

## 2018-07-23 DIAGNOSIS — Z794 Long term (current) use of insulin: Secondary | ICD-10-CM

## 2018-07-23 DIAGNOSIS — I509 Heart failure, unspecified: Secondary | ICD-10-CM | POA: Diagnosis not present

## 2018-07-23 DIAGNOSIS — J188 Other pneumonia, unspecified organism: Secondary | ICD-10-CM | POA: Diagnosis not present

## 2018-07-23 LAB — COMPREHENSIVE METABOLIC PANEL
ALT: 16 U/L (ref 0–53)
AST: 19 U/L (ref 0–37)
Albumin: 3.9 g/dL (ref 3.5–5.2)
Alkaline Phosphatase: 58 U/L (ref 39–117)
BUN: 23 mg/dL (ref 6–23)
CO2: 27 mEq/L (ref 19–32)
Calcium: 9.8 mg/dL (ref 8.4–10.5)
Chloride: 110 mEq/L (ref 96–112)
Creatinine, Ser: 1.37 mg/dL (ref 0.40–1.50)
GFR: 62.11 mL/min (ref >60.00)
Glucose, Bld: 173 mg/dL — ABNORMAL HIGH (ref 70–99)
Potassium: 4.2 mEq/L (ref 3.5–5.1)
Sodium: 142 mEq/L (ref 135–145)
Total Bilirubin: 0.6 mg/dL (ref 0.2–1.2)
Total Protein: 6.5 g/dL (ref 6.0–8.3)

## 2018-07-23 LAB — CBC WITH DIFFERENTIAL/PLATELET
BASOS ABS: 0 10*3/uL (ref 0.0–0.1)
Basophils Relative: 1.2 % (ref 0.0–3.0)
EOS ABS: 0.1 10*3/uL (ref 0.0–0.7)
Eosinophils Relative: 2.8 % (ref 0.0–5.0)
HEMATOCRIT: 36.8 % — AB (ref 39.0–52.0)
Hemoglobin: 12.1 g/dL — ABNORMAL LOW (ref 13.0–17.0)
LYMPHS PCT: 27.7 % (ref 12.0–46.0)
Lymphs Abs: 1 10*3/uL (ref 0.7–4.0)
MCHC: 33 g/dL (ref 30.0–36.0)
MCV: 82 fl (ref 78.0–100.0)
Monocytes Absolute: 0.3 10*3/uL (ref 0.1–1.0)
Monocytes Relative: 8.5 % (ref 3.0–12.0)
Neutro Abs: 2.2 10*3/uL (ref 1.4–7.7)
Neutrophils Relative %: 59.8 % (ref 43.0–77.0)
Platelets: 183 10*3/uL (ref 150.0–400.0)
RBC: 4.48 Mil/uL (ref 4.22–5.81)
RDW: 14.3 % (ref 11.5–15.5)
WBC: 3.6 10*3/uL — ABNORMAL LOW (ref 4.0–10.5)

## 2018-07-23 LAB — LIPID PANEL
CHOLESTEROL: 112 mg/dL (ref 0–200)
HDL: 48 mg/dL (ref >39.00)
LDL CALC: 53 mg/dL (ref 0–99)
NonHDL: 64.48
Total CHOL/HDL Ratio: 2
Triglycerides: 58 mg/dL (ref 0.0–149.0)
VLDL: 11.6 mg/dL (ref 0.0–40.0)

## 2018-07-23 LAB — URIC ACID: Uric Acid, Serum: 6.9 mg/dL (ref 4.0–7.8)

## 2018-07-23 LAB — HEMOGLOBIN A1C: Hgb A1c MFr Bld: 6.8 % — ABNORMAL HIGH (ref 4.6–6.5)

## 2018-07-23 MED ORDER — HYDRALAZINE HCL 50 MG PO TABS: 50.0000 mg | ORAL_TABLET | Freq: Three times a day (TID) | ORAL | 1 refills | Status: DC

## 2018-07-23 MED ORDER — GLIMEPIRIDE 1 MG PO TABS: ORAL_TABLET | ORAL | 1 refills | Status: DC

## 2018-07-23 MED ORDER — ISOSORBIDE MONONITRATE ER 60 MG PO TB24: 60.0000 mg | ORAL_TABLET | Freq: Two times a day (BID) | ORAL | 1 refills | Status: DC

## 2018-07-23 MED ORDER — COLCHICINE 0.6 MG PO TABS: ORAL_TABLET | ORAL | 1 refills | Status: DC

## 2018-07-23 MED ORDER — CLONIDINE HCL 0.3 MG PO TABS: 0.3000 mg | ORAL_TABLET | Freq: Two times a day (BID) | ORAL | 1 refills | Status: DC

## 2018-07-23 MED ORDER — LOSARTAN POTASSIUM 100 MG PO TABS: 100.0000 mg | ORAL_TABLET | Freq: Every day | ORAL | 1 refills | Status: DC

## 2018-07-23 MED ORDER — METOPROLOL SUCCINATE ER 100 MG PO TB24: 100.0000 mg | ORAL_TABLET | Freq: Two times a day (BID) | ORAL | 1 refills | Status: DC

## 2018-07-23 MED ORDER — AMLODIPINE BESYLATE 10 MG PO TABS: 10.0000 mg | ORAL_TABLET | Freq: Every day | ORAL | 1 refills | Status: DC

## 2018-07-23 NOTE — Assessment & Plan Note (Signed)
BP well controlled Current regimen effective and well tolerated Continue current medications at current doses cmp  

## 2018-07-23 NOTE — Assessment & Plan Note (Signed)
a1c Advised increasing his exercise to 4 days a week Eye exam up-to-date-we will get report Follow-up in 6 months

## 2018-07-23 NOTE — Assessment & Plan Note (Signed)
cmp Drinks water throughout day Does not take any nsaids

## 2018-07-23 NOTE — Assessment & Plan Note (Signed)
Intermittent-his wife is not able to quantify how often this occurs It is transient Will refer to neurology

## 2018-07-23 NOTE — Assessment & Plan Note (Addendum)
No gout symptoms since he was here last Continue colchicine as needed only Check uric acid level

## 2018-07-23 NOTE — Assessment & Plan Note (Signed)
His wife has noticed occasional left hand muscle twitching-she states she is only noticed it when they are driving.  He has not noticed it and is unsure how often it occurs. Will be seen neurology so advised that they can discuss this with neurology

## 2018-07-24 ENCOUNTER — Encounter: Payer: Self-pay | Admitting: Internal Medicine

## 2018-07-24 NOTE — Telephone Encounter (Signed)
Called and spoke with Sonia Baller, [facility]. Sonia Baller stated that insurance will only cover mask and head gear every 6 months.  Patient received dream wear mask 04/2018.  Patient can receive new nasal pillows, just not mask and head gear until June, unless he pays out of pocket.  [facility]C Patient. Left message on machine to call back.

## 2018-07-25 NOTE — Telephone Encounter (Signed)
Spoke with pt. He is aware that he can't receive a new mask until June. Pt verbalized understanding. Nothing further was needed.

## 2018-07-25 NOTE — Telephone Encounter (Signed)
Patient returning phone call not sure who called   Call back # [phone]

## 2018-07-25 NOTE — Telephone Encounter (Signed)
Attempted to call pt but unable to reach. Left message for pt to return call. 

## 2018-07-29 ENCOUNTER — Ambulatory Visit (INDEPENDENT_AMBULATORY_CARE_PROVIDER_SITE_OTHER): Payer: Medicare HMO | Admitting: Nurse Practitioner

## 2018-07-29 ENCOUNTER — Other Ambulatory Visit: Payer: Self-pay

## 2018-07-29 ENCOUNTER — Encounter: Payer: Self-pay | Admitting: Nurse Practitioner

## 2018-07-29 VITALS — BP 126/70 | HR 60 | Ht 66.0 in | Wt 192.0 lb

## 2018-07-29 DIAGNOSIS — G4733 Obstructive sleep apnea (adult) (pediatric): Secondary | ICD-10-CM | POA: Diagnosis not present

## 2018-07-29 NOTE — Progress Notes (Signed)
@Patient  ID: Corey [name], male    DOB: 03-17-1946, 73 y.o.   MRN: [id number]  Chief Complaint  Patient presents with  . Follow-up    OSA (obstructive sleep apnea)    Referring provider: [name], MD  HPI 73 year old male with moderate sleep apnea followed by Dr. Halford Chessman.  Tests:  HST 03/22/18 - AHI 26.6, SPO2 low 81%  Echo 05/30/17 >> EF 60 to 65%, grade 3 DD  CPAP compliance report 06/29/18 - 07/28/18: Usage days 30/30 (100%), average usage 5 hours 3 minutes, CPAP autoset 5-15 cmH20, AHI 11.7  OV 07/29/18 - Follow up Patient presents for follow-up on CPAP.  States that he has been compliant with his CPAP machine and wears it nightly.  He states that he does feel much better during the day with less drowsiness.  CPAP report shows compliance.  At his last visit his AHI was high at 20.1.  We ordered a new mask and fitting but patient states that his insurance would not cover this until June of this year.  He states that he has been doing better with his mask but feels that it takes too long for the pressure to build up.  Patient's average usage of CPAP is 5 hours and 3 minutes per night the patient states that this is his usual sleep pattern.  He goes to bed at midnight and wakes up around 5 or 6 AM. Denies f/c/s, n/v/d, hemoptysis, PND, leg swelling.    Allergies  Allergen Reactions  . Hydrochlorothiazide Other (See Comments)    Gout , uncontrolled diabetes and renal insufficiency  . Allopurinol Other (See Comments)    Renal insufficiency    Immunization History  Administered Date(s) Administered  . Influenza Split 03/02/2011  . Influenza Whole 01/23/2013  . Influenza, High Dose Seasonal PF 01/28/2016, 01/23/2017, 01/21/2018  . Influenza,inj,Quad PF,6+ Mos 02/23/2014, 02/24/2015  . Pneumococcal Conjugate-13 05/04/2015  . Pneumococcal Polysaccharide-23 10/02/2012, 01/21/2018  . Tdap 12/15/2010, 09/21/2011    Past Medical History:  Diagnosis Date  . Cataract   .  Colon polyp 2003   Dr [name]; F/U was to be 2008( not completed)  . CVA (cerebral infarction) 2014  . Diabetes mellitus   . Diverticulosis 2003  . Dyspnea    with exertion  . Gout   . Hypertension   . Myocardial infarction ([place]) 2014  . OSA (obstructive sleep apnea) 03/27/2018  . Pneumonia 09/29/11   Avelox X 10 days as OP  . Prostate cancer ([place])   . Renal insufficiency   . Seizures ([place])    not treated for seizure disorder; had a seizure after stroke 2014; no seizure since then  . Stroke [facility])    at same time he had MI  . Urinary frequency     Tobacco History: Social History   Tobacco Use  Smoking Status Former Smoker  . Packs/day: 0.25  . Years: 1.00  . Pack years: 0.25  . Types: Cigarettes  . Last attempt to quit: 05/15/1968  . Years since quitting: 50.2  Smokeless Tobacco Never Used  Tobacco Comment   smoked [place], up to 1-2 cigarettes/ day   Counseling given: Yes Comment: smoked [place], up to 1-2 cigarettes/ day   Outpatient Encounter Medications as of 07/29/2018  Medication Sig  . ACCU-CHEK AVIVA PLUS test strip CHECK BLOOD SUGAR ONE TIME DAILY  . amLODipine (NORVASC) 10 MG tablet Take 1 tablet (10 mg total) by mouth daily.  Marland Kitchen aspirin  EC 81 MG tablet Take 1 tablet (81 mg total) by mouth daily.  . Blood Glucose Monitoring Suppl (ACCU-CHEK AVIVA) device USE METER TO CHECK BLOOD SUGAR 2 TIMES DAILY.  . cloNIDine (CATAPRES) 0.3 MG tablet Take 1 tablet (0.3 mg total) by mouth 2 (two) times daily.  . colchicine (COLCRYS) 0.6 MG tablet Take 2 tabs once and then one tab one hour later as needed for gout  . glimepiride (AMARYL) 1 MG tablet Take 1 tablet daily before supper.  . hydrALAZINE (APRESOLINE) 50 MG tablet Take 1 tablet (50 mg total) by mouth 3 (three) times daily.  . isosorbide mononitrate (IMDUR) 60 MG 24 hr tablet Take 1 tablet (60 mg total) by mouth 2 (two) times daily.  [name] Devices (ACCU-CHEK SOFTCLIX) lancets Use to test blood sugar 2  times daily- Dx code E11.29  . losartan (COZAAR) 100 MG tablet Take 1 tablet (100 mg total) by mouth daily.  . metFORMIN (GLUCOPHAGE-XR) 750 MG 24 hr tablet TAKE 1 TABLET BY MOUTH twice daily with food  . metoprolol succinate (TOPROL-XL) 100 MG 24 hr tablet Take 1 tablet (100 mg total) by mouth 2 (two) times daily.  Marland Kitchen oxybutynin (DITROPAN-XL) 10 MG 24 hr tablet Take 10 mg by mouth as needed.   No facility-administered encounter medications on file as of 07/29/2018.      Review of Systems  Review of Systems  Constitutional: Negative.   HENT: Negative.   Respiratory: Negative for cough and shortness of breath.   Cardiovascular: Negative.  Negative for chest pain, palpitations and leg swelling.  Gastrointestinal: Negative.   Allergic/Immunologic: Negative.   Neurological: Negative.   Psychiatric/Behavioral: Negative.        Physical Exam  BP 126/70 (BP Location: Left Arm, Patient Position: Sitting, Cuff Size: Normal)   Pulse 60   Ht 5\' 6"  (1.676 m)   Wt 192 lb (87.1 kg)   SpO2 100%   BMI 30.99 kg/m   Wt Readings from Last 5 Encounters:  07/29/18 192 lb (87.1 kg)  07/23/18 190 lb 6.4 oz (86.4 kg)  07/01/18 187 lb (84.8 kg)  04/10/18 188 lb 6.4 oz (85.5 kg)  03/12/18 188 lb (85.3 kg)     Physical Exam Vitals signs and nursing note reviewed.  Constitutional:      General: He is not in acute distress.    Appearance: He is well-developed.  Cardiovascular:     Rate and Rhythm: Normal rate and regular rhythm.  Pulmonary:     Effort: Pulmonary effort is normal. No respiratory distress.     Breath sounds: Normal breath sounds. No wheezing or rhonchi.  Skin:    General: Skin is warm and dry.  Neurological:     Mental Status: He is alert and oriented to person, place, and time.       Assessment & Plan:   OSA (obstructive sleep apnea) Patient presents for follow-up on CPAP.  States that he has been compliant with his CPAP machine and wears it nightly.  He states that  he does feel much better during the day with less drowsiness.  CPAP report shows compliance.  At his last visit his AHI was high at 20.1.  We ordered a new mask and fitting but patient states that his insurance would not cover this until June of this year.  He states that he has been doing better with his mask but feels that it takes too long for the pressure to build up.   Patient Instructions  Patient continues to benefit from CPAP with good compliance and control documented Adjust CPAP to 7-15 cmH20 Continue current medications Goal of 4 hours or more usage per night Maintain healthy weight Do not drive if drowsy Follow up with Dr. Halford Chessman in 4 months or sooner if needed  Coronavirus (COVID-19) Are you at risk?  Are you at risk for the Coronavirus (COVID-19)?  To be considered HIGH RISK for Coronavirus (COVID-19), you have to meet the following criteria:  . Traveled to [place], [place], [place], [place] or [place]; or in the [place] to [place], [place], [place], or [place]; and have fever, cough, and shortness of breath within the last 2 weeks of travel OR . Been in close contact with a person diagnosed with COVID-19 within the last 2 weeks and have fever, cough, and shortness of breath . IF YOU DO NOT MEET THESE CRITERIA, YOU ARE CONSIDERED LOW RISK FOR COVID-19.  What to do if you are HIGH RISK for COVID-19?  Marland Kitchen If you are having a medical emergency, call 911. . Seek medical care right away. Before you go to a doctor's office, urgent care or emergency department, call ahead and tell them about your recent travel, contact with someone diagnosed with COVID-19, and your symptoms. You should receive instructions from your physician's office regarding next steps of care.  . When you arrive at healthcare provider, tell the healthcare staff immediately you have returned from visiting [place], [place], [place], [place] or [place]; or traveled in the [place] to [place], [place],  [place], or [place]; in the last two weeks or you have been in close contact with a person diagnosed with COVID-19 in the last 2 weeks.   . Tell the health care staff about your symptoms: fever, cough and shortness of breath. . After you have been seen by a medical provider, you will be either: o Tested for (COVID-19) and discharged home on quarantine except to seek medical care if symptoms worsen, and asked to  - Stay home and avoid contact with others until you get your results (4-5 days)  - Avoid travel on public transportation if possible (such as bus, train, or airplane) or o Sent to the Emergency Department by EMS for evaluation, COVID-19 testing, and possible admission depending on your condition and test results.  What to do if you are LOW RISK for COVID-19?  Reduce your risk of any infection by using the same precautions used for avoiding the common cold or flu:  Marland Kitchen Wash your hands often with soap and warm water for at least 20 seconds.  If soap and water are not readily available, use an alcohol-based hand sanitizer with at least 60% alcohol.  . If coughing or sneezing, cover your mouth and nose by coughing or sneezing into the elbow areas of your shirt or coat, into a tissue or into your sleeve (not your hands). . Avoid shaking hands with others and consider head nods or verbal greetings only. . Avoid touching your eyes, nose, or mouth with unwashed hands.  . Avoid close contact with people who are sick. . Avoid places or events with large numbers of people in one location, like concerts or sporting events. . Carefully consider travel plans you have or are making. . If you are planning any travel outside or inside the [place], visit the CDC's Travelers' Health webpage for the latest health notices. . If you have some symptoms but not all symptoms, continue to monitor  at home and seek medical attention if your symptoms worsen. . If you are having a medical emergency, call 911.    ADDITIONAL HEALTHCARE OPTIONS FOR PATIENTS  [facility] / e-Visit: [email or web address]         [facility]: [facility]: [phone]                   [facility]: [phone]         Fenton Foy, NP 07/29/2018

## 2018-07-29 NOTE — Patient Instructions (Addendum)
Patient continues to benefit from CPAP with good compliance and control documented Adjust CPAP to 7-15 cmH20 Continue current medications Goal of 4 hours or more usage per night Maintain healthy weight Do not drive if drowsy Follow up with Dr. Halford Chessman in 4 months or sooner if needed  Coronavirus (COVID-19) Are you at risk?  Are you at risk for the Coronavirus (COVID-19)?  To be considered HIGH RISK for Coronavirus (COVID-19), you have to meet the following criteria:  . Traveled to [place], [place], [place], [place] or [place]; or in the [place] to [place], [place], [place], or [place]; and have fever, cough, and shortness of breath within the last 2 weeks of travel OR . Been in close contact with a person diagnosed with COVID-19 within the last 2 weeks and have fever, cough, and shortness of breath . IF YOU DO NOT MEET THESE CRITERIA, YOU ARE CONSIDERED LOW RISK FOR COVID-19.  What to do if you are HIGH RISK for COVID-19?  Marland Kitchen If you are having a medical emergency, call 911. . Seek medical care right away. Before you go to a doctor's office, urgent care or emergency department, call ahead and tell them about your recent travel, contact with someone diagnosed with COVID-19, and your symptoms. You should receive instructions from your physician's office regarding next steps of care.  . When you arrive at healthcare provider, tell the healthcare staff immediately you have returned from visiting [place], [place], [place], [place] or [place]; or traveled in the [place] to [place], [place], [place], or [place]; in the last two weeks or you have been in close contact with a person diagnosed with COVID-19 in the last 2 weeks.   . Tell the health care staff about your symptoms: fever, cough and shortness of breath. . After you have been seen by a medical provider, you will be either: o Tested for (COVID-19) and discharged home on quarantine except to seek medical care if symptoms  worsen, and asked to  - Stay home and avoid contact with others until you get your results (4-5 days)  - Avoid travel on public transportation if possible (such as bus, train, or airplane) or o Sent to the Emergency Department by EMS for evaluation, COVID-19 testing, and possible admission depending on your condition and test results.  What to do if you are LOW RISK for COVID-19?  Reduce your risk of any infection by using the same precautions used for avoiding the common cold or flu:  Marland Kitchen Wash your hands often with soap and warm water for at least 20 seconds.  If soap and water are not readily available, use an alcohol-based hand sanitizer with at least 60% alcohol.  . If coughing or sneezing, cover your mouth and nose by coughing or sneezing into the elbow areas of your shirt or coat, into a tissue or into your sleeve (not your hands). . Avoid shaking hands with others and consider head nods or verbal greetings only. . Avoid touching your eyes, nose, or mouth with unwashed hands.  . Avoid close contact with people who are sick. . Avoid places or events with large numbers of people in one location, like concerts or sporting events. . Carefully consider travel plans you have or are making. . If you are planning any travel outside or inside the [place], visit the CDC's Travelers' Health webpage for the latest health notices. . If you have some symptoms but not all symptoms, continue to monitor  at home and seek medical attention if your symptoms worsen. . If you are having a medical emergency, call 911.   [place] / e-Visit: [email or web address]         [facility]: [facility]: [phone]                   [facility]: [phone]

## 2018-07-29 NOTE — Assessment & Plan Note (Signed)
Patient presents for follow-up on CPAP.  States that he has been compliant with his CPAP machine and wears it nightly.  He states that he does feel much better during the day with less drowsiness.  CPAP report shows compliance.  At his last visit his AHI was high at 20.1.  We ordered a new mask and fitting but patient states that his insurance would not cover this until June of this year.  He states that he has been doing better with his mask but feels that it takes too long for the pressure to build up.   Patient Instructions  Patient continues to benefit from CPAP with good compliance and control documented Adjust CPAP to 7-15 cmH20 Continue current medications Goal of 4 hours or more usage per night Maintain healthy weight Do not drive if drowsy Follow up with Dr. Halford Chessman in 4 months or sooner if needed  Coronavirus (COVID-19) Are you at risk?  Are you at risk for the Coronavirus (COVID-19)?  To be considered HIGH RISK for Coronavirus (COVID-19), you have to meet the following criteria:  . Traveled to [place], [place], [place], [place] or [place]; or in the [place] to [place], [place], [place], or [place]; and have fever, cough, and shortness of breath within the last 2 weeks of travel OR . Been in close contact with a person diagnosed with COVID-19 within the last 2 weeks and have fever, cough, and shortness of breath . IF YOU DO NOT MEET THESE CRITERIA, YOU ARE CONSIDERED LOW RISK FOR COVID-19.  What to do if you are HIGH RISK for COVID-19?  Marland Kitchen If you are having a medical emergency, call 911. . Seek medical care right away. Before you go to a doctor's office, urgent care or emergency department, call ahead and tell them about your recent travel, contact with someone diagnosed with COVID-19, and your symptoms. You should receive instructions from your physician's office regarding next steps of care.  . When you arrive at healthcare provider, tell the healthcare staff immediately  you have returned from visiting [place], [place], [place], [place] or [place]; or traveled in the [place] to [place], [place], [place], or [place]; in the last two weeks or you have been in close contact with a person diagnosed with COVID-19 in the last 2 weeks.   . Tell the health care staff about your symptoms: fever, cough and shortness of breath. . After you have been seen by a medical provider, you will be either: o Tested for (COVID-19) and discharged home on quarantine except to seek medical care if symptoms worsen, and asked to  - Stay home and avoid contact with others until you get your results (4-5 days)  - Avoid travel on public transportation if possible (such as bus, train, or airplane) or o Sent to the Emergency Department by EMS for evaluation, COVID-19 testing, and possible admission depending on your condition and test results.  What to do if you are LOW RISK for COVID-19?  Reduce your risk of any infection by using the same precautions used for avoiding the common cold or flu:  Marland Kitchen Wash your hands often with soap and warm water for at least 20 seconds.  If soap and water are not readily available, use an alcohol-based hand sanitizer with at least 60% alcohol.  . If coughing or sneezing, cover your mouth and nose by coughing or sneezing into the elbow areas of your shirt or coat, into a tissue or  into your sleeve (not your hands). . Avoid shaking hands with others and consider head nods or verbal greetings only. . Avoid touching your eyes, nose, or mouth with unwashed hands.  . Avoid close contact with people who are sick. . Avoid places or events with large numbers of people in one location, like concerts or sporting events. . Carefully consider travel plans you have or are making. . If you are planning any travel outside or inside the [place], visit the CDC's Travelers' Health webpage for the latest health notices. . If you have some symptoms but not all symptoms, continue  to monitor at home and seek medical attention if your symptoms worsen. . If you are having a medical emergency, call 911.   [place] / e-Visit: [email or web address]         [facility]: [facility]: [phone]                   [facility]: [phone]

## 2018-08-07 ENCOUNTER — Other Ambulatory Visit: Payer: Medicare HMO

## 2018-08-09 ENCOUNTER — Encounter: Payer: Self-pay | Admitting: Neurology

## 2018-08-12 ENCOUNTER — Encounter: Payer: Self-pay | Admitting: Endocrinology

## 2018-08-12 ENCOUNTER — Ambulatory Visit (INDEPENDENT_AMBULATORY_CARE_PROVIDER_SITE_OTHER): Payer: Medicare HMO | Admitting: Endocrinology

## 2018-08-12 ENCOUNTER — Other Ambulatory Visit: Payer: Self-pay

## 2018-08-12 VITALS — BP 118/68 | HR 51 | Ht 66.0 in | Wt 197.4 lb

## 2018-08-12 DIAGNOSIS — N189 Chronic kidney disease, unspecified: Secondary | ICD-10-CM

## 2018-08-12 DIAGNOSIS — I951 Orthostatic hypotension: Secondary | ICD-10-CM | POA: Diagnosis not present

## 2018-08-12 DIAGNOSIS — E1165 Type 2 diabetes mellitus with hyperglycemia: Secondary | ICD-10-CM | POA: Diagnosis not present

## 2018-08-12 NOTE — Progress Notes (Signed)
Patient ID: Corey [name], male   DOB: Jul 02, 1945, 73 y.o.   MRN: [id number]   Reason for Appointment : Followup of Type 2 Diabetes   History of Present Illness          Diagnosis: Type 2 diabetes mellitus, date of diagnosis: 1997      Past history: The patient was initially treated with various oral hypoglycemic drugs and details are not available. About 2009 he was started on insulin and has either taken premixed insulin or Lantus once a day He had difficulty affording his Lantus insulin and prefers to take premixed insulin  Recent history:    Oral hypoglycemic drugs: Metformin ER 750 mg twice daily, glimepiride 1 mg daily at dinner  His A1c is now 6.8, previously 7.2  Current blood sugar patterns and problems:  He has not been seen since 7/19  He does appear to be checking blood sugars mostly before breakfast and some before dinner but usually not after meals  His blood sugars are averaging lower than before  However has no hypoglycemia with lowest reading documented at 70  He thinks his test strips are not expired  However still has some postprandial hyperglycemia with a reading of 173 after eating cereal in the morning in the lab  Also tending to have mostly cereal in the mornings without protein  He has taken his medications as directed including metformin twice daily  He is trying to walk but only about 2 times a week   Hypoglycemia : none     Glucose monitoring:  done about 1 time a day         Glucometer:  Accu-Chek      Blood Glucose readings by monitor download:    PRE-MEAL Fasting Lunch Dinner Bedtime Overall  Glucose range:  85-105   70, 77  79   Mean/median:  94     104   POST-MEAL PC Breakfast PC Lunch PC Dinner  Glucose range:    179  Mean/median:      Previous average at home 125  Self-care:       Meals: 2-3 meals per day. Egg in am at 9-10 am or grits , rarely sandwich at lunch. His largest meal is at suppertime at about 8 pm.   Avoiding regular soft drinks  Physical activity: exercise: Walking 30 minutes; 2-3/7  days        Dietician visit: Most recent: 02/2014               Compliance with the medical regimen:  Fair.    Wt Readings from Last 3 Encounters:  08/12/18 197 lb 6.4 oz (89.5 kg)  07/29/18 192 lb (87.1 kg)  07/23/18 190 lb 6.4 oz (86.4 kg)    Lab Results  Component Value Date   HGBA1C 6.8 (H) 07/23/2018   HGBA1C 7.2 (H) 11/29/2017   HGBA1C 6.7 (H) 07/10/2017    Lab Results  Component Value Date   MICROALBUR 10.4 (H) 11/29/2017   LDLCALC 53 07/23/2018   CREATININE 1.37 07/23/2018   No visits with results within 1 Week(s) from this visit.  Latest known visit with results is:  Appointment on 07/23/2018  Component Date Value Ref Range Status  . WBC 07/23/2018 3.6* 4.0 - 10.5 K/uL Final  . RBC 07/23/2018 4.48  4.22 - 5.81 Mil/uL Final  . Hemoglobin 07/23/2018 12.1* 13.0 - 17.0 g/dL Final  . HCT 07/23/2018 36.8* 39.0 - 52.0 % Final  . MCV 07/23/2018 82.0  78.0 - 100.0 fl Final  . MCHC 07/23/2018 33.0  30.0 - 36.0 g/dL Final  . RDW 07/23/2018 14.3  11.5 - 15.5 % Final  . Platelets 07/23/2018 183.0  150.0 - 400.0 K/uL Final  . Neutrophils Relative % 07/23/2018 59.8  43.0 - 77.0 % Final  . Lymphocytes Relative 07/23/2018 27.7  12.0 - 46.0 % Final  . Monocytes Relative 07/23/2018 8.5  3.0 - 12.0 % Final  . Eosinophils Relative 07/23/2018 2.8  0.0 - 5.0 % Final  . Basophils Relative 07/23/2018 1.2  0.0 - 3.0 % Final  . Neutro Abs 07/23/2018 2.2  1.4 - 7.7 K/uL Final  . Lymphs Abs 07/23/2018 1.0  0.7 - 4.0 K/uL Final  . Monocytes Absolute 07/23/2018 0.3  0.1 - 1.0 K/uL Final  . Eosinophils Absolute 07/23/2018 0.1  0.0 - 0.7 K/uL Final  . Basophils Absolute 07/23/2018 0.0  0.0 - 0.1 K/uL Final  . Cholesterol 07/23/2018 112  0 - 200 mg/dL Final   ATP III Classification       Desirable:  < 200 mg/dL               Borderline High:  200 - 239 mg/dL          High:  > = 240 mg/dL  . Triglycerides  07/23/2018 58.0  0.0 - 149.0 mg/dL Final   Normal:  <150 mg/dLBorderline High:  150 - 199 mg/dL  . HDL 07/23/2018 48.00  >39.00 mg/dL Final  . VLDL 07/23/2018 11.6  0.0 - 40.0 mg/dL Final  . LDL Cholesterol 07/23/2018 53  0 - 99 mg/dL Final  . Total CHOL/HDL Ratio 07/23/2018 2   Final                  Men          Women1/2 Average Risk     3.4          3.3Average Risk          5.0          4.42X Average Risk          9.6          7.13X Average Risk          15.0          11.0                      . NonHDL 07/23/2018 64.48   Final   NOTE:  Non-HDL goal should be 30 mg/dL higher than patient's LDL goal (i.e. LDL goal of < 70 mg/dL, would have non-HDL goal of < 100 mg/dL)  . Hgb A1c MFr Bld 07/23/2018 6.8* 4.6 - 6.5 % Final   Glycemic Control Guidelines for People with Diabetes:Non Diabetic:  <6%Goal of Therapy: <7%Additional Action Suggested:  >8%   . Sodium 07/23/2018 142  135 - 145 mEq/L Final  . Potassium 07/23/2018 4.2  3.5 - 5.1 mEq/L Final  . Chloride 07/23/2018 110  96 - 112 mEq/L Final  . CO2 07/23/2018 27  19 - 32 mEq/L Final  . Glucose, Bld 07/23/2018 173* 70 - 99 mg/dL Final  . BUN 07/23/2018 23  6 - 23 mg/dL Final  . Creatinine, Ser 07/23/2018 1.37  0.40 - 1.50 mg/dL Final  . Total Bilirubin 07/23/2018 0.6  0.2 - 1.2 mg/dL Final  . Alkaline Phosphatase 07/23/2018 58  39 - 117 U/L Final  . AST 07/23/2018 19  0 - 37  U/L Final  . ALT 07/23/2018 16  0 - 53 U/L Final  . Total Protein 07/23/2018 6.5  6.0 - 8.3 g/dL Final  . Albumin 07/23/2018 3.9  3.5 - 5.2 g/dL Final  . Calcium 07/23/2018 9.8  8.4 - 10.5 mg/dL Final  . GFR 07/23/2018 62.11  >60.00 mL/min Final  . Uric Acid, Serum 07/23/2018 6.9  4.0 - 7.8 mg/dL Final     Allergies as of 08/12/2018      Reactions   Hydrochlorothiazide Other (See Comments)   Gout , uncontrolled diabetes and renal insufficiency   Allopurinol Other (See Comments)   Renal insufficiency      Medication List       Accurate as of August 12, 2018   9:35 AM. Always use your most recent med list.        Accu-Chek Aviva device USE METER TO CHECK BLOOD SUGAR 2 TIMES DAILY.   Accu-Chek Aviva Plus test strip Generic drug:  glucose blood CHECK BLOOD SUGAR ONE TIME DAILY   accu-chek softclix lancets Use to test blood sugar 2 times daily- Dx code E11.29   amLODipine 10 MG tablet Commonly known as:  NORVASC Take 1 tablet (10 mg total) by mouth daily.   aspirin EC 81 MG tablet Take 1 tablet (81 mg total) by mouth daily.   cloNIDine 0.3 MG tablet Commonly known as:  Catapres Take 1 tablet (0.3 mg total) by mouth 2 (two) times daily.   colchicine 0.6 MG tablet Commonly known as:  Colcrys Take 2 tabs once and then one tab one hour later as needed for gout   glimepiride 1 MG tablet Commonly known as:  AMARYL Take 1 tablet daily before supper.   hydrALAZINE 50 MG tablet Commonly known as:  APRESOLINE Take 1 tablet (50 mg total) by mouth 3 (three) times daily.   isosorbide mononitrate 60 MG 24 hr tablet Commonly known as:  IMDUR Take 1 tablet (60 mg total) by mouth 2 (two) times daily.   losartan 100 MG tablet Commonly known as:  COZAAR Take 1 tablet (100 mg total) by mouth daily.   metFORMIN 750 MG 24 hr tablet Commonly known as:  GLUCOPHAGE-XR TAKE 1 TABLET BY MOUTH twice daily with food   metoprolol succinate 100 MG 24 hr tablet Commonly known as:  TOPROL-XL Take 1 tablet (100 mg total) by mouth 2 (two) times daily.   oxybutynin 10 MG 24 hr tablet Commonly known as:  DITROPAN-XL Take 10 mg by mouth as needed.       Allergies:  Allergies  Allergen Reactions  . Hydrochlorothiazide Other (See Comments)    Gout , uncontrolled diabetes and renal insufficiency  . Allopurinol Other (See Comments)    Renal insufficiency    Past Medical History:  Diagnosis Date  . Cataract   . Colon polyp 2003   Dr [name]; F/U was to be 2008( not completed)  . CVA (cerebral infarction) 2014  . Diabetes mellitus   .  Diverticulosis 2003  . Dyspnea    with exertion  . Gout   . Hypertension   . Myocardial infarction ([place]) 2014  . OSA (obstructive sleep apnea) 03/27/2018  . Pneumonia 09/29/11   Avelox X 10 days as OP  . Prostate cancer ([place])   . Renal insufficiency   . Seizures ([place])    not treated for seizure disorder; had a seizure after stroke 2014; no seizure since then  . Stroke [facility])    at same time he  had MI  . Urinary frequency     Past Surgical History:  Procedure Laterality Date  . COLONOSCOPY W/ POLYPECTOMY  2003   no F/U ([place] discussed 12/03/12)  . INSERTION OF MESH N/A 07/16/2017   Procedure: INSERTION OF MESH;  Surgeon: [name], MD;  Location: [facility];  Service: General;  Laterality: N/A;  . PEG PLACEMENT  2014  . PEG TUBE REMOVAL  2014  . PROSTATE BIOPSY    . TRACHEOSTOMY  2014   was closed after hospital stay  . VENTRAL HERNIA REPAIR N/A 07/16/2017   Procedure: LAPAROSCOPIC VENTRAL HERNIA REPAIR WITH MESH;  Surgeon: [name], MD;  Location: [facility] OR;  Service: General;  Laterality: N/A;  . wrist aspiration  02/16/12    monosodium urate crystals; Dr Caralyn Guile    Family History  Problem Relation Age of Onset  . Hypertension Father   . Prostate cancer Father   . Diabetes Father   . Cancer Father   . Hypertension Mother   . Diabetes Mother   . Stroke Neg Hx   . Heart disease Neg Hx   . Colon cancer Neg Hx     Social History:  reports that he quit smoking about 50 years ago. His smoking use included cigarettes. He has a 0.25 pack-year smoking history. He has never used smokeless tobacco. He reports that he does not drink alcohol or use drugs.    Review of Systems   Lipids: Normal and  not on a statin drug  Lab Results  Component Value Date   CHOL 112 07/23/2018   HDL 48.00 07/23/2018   LDLCALC 53 07/23/2018   TRIG 58.0 07/23/2018   CHOLHDL 2 07/23/2018    Eye exams: Recent exam reports not available    He has mild renal insufficiency of unclear  etiology with variable creatinine levels as below:  Lab Results  Component Value Date   CREATININE 1.37 07/23/2018   CREATININE 1.47 11/29/2017   CREATININE 1.45 (H) 07/10/2017     HYPERTENSION: Currently on losartan, amlodipine, clonidine 0.3 mg and hydralazine as well as metoprolol Does not feel lightheaded This is followed by PCP  BP Readings from Last 3 Encounters:  08/12/18 118/68  07/29/18 126/70  07/23/18 120/66    Foot exam done in 3/20 with PCP  Physical Examination:  BP 118/68 (BP Location: Left Arm, Patient Position: Sitting, Cuff Size: Normal)   Pulse (!) 51   Ht 5\' 6"  (1.676 m)   Wt 197 lb 6.4 oz (89.5 kg)   SpO2 98%   BMI 31.86 kg/m   Standing blood pressure 108/68  ASSESSMENT/PLAN:  Diabetes type 2, uncontrolled   See history of present illness for discussion of current diabetes management, blood sugar patterns and problems identified  His A1c is fairly good with a level of 6.8 He is on metformin and Amaryl  Blood sugars reviewed in detail on his monitor download  Although his blood sugars are fairly good and near normal fasting most of the time he seems to have high postprandial readings He does not monitor these at this time Also cannot consistently keep his weight down and has gained some weight this month Discussed increasing the frequency of exercising with walking daily Discussed adding protein to breakfast consistently Also he can let us know if he has any difficulties with blood sugars going below 70 or any symptoms of hypoglycemia Needs to eat a consistent snack midday since he does not eat lunch Discussed when to check  the blood sugars as well as blood sugar targets at various times  Reminded him to have more regular consistent follow-up  He will have the report of the eye exam sent to [place] To have urine microalbumin checked on next visit   HYPERTENSION: On multiple relatively high dose medications Blood pressure is lower than usual  and mildly orthostatic also although asymptomatic Recommend that he only take half tablet of the 10 mg amlodipine and continue follow-up with other specialists  Renal dysfunction: Relatively better  Total visit time for evaluation and management of multiple problems and counseling =25 minutes   Patient Instructions  Less cereal  Check blood sugars on waking up days a week  Also check blood sugars about 2 hours after meals and do this after different meals by rotation  Recommended blood sugar levels on waking up are 90-130 and about 2 hours after meal is 130-160  Please bring your blood sugar monitor to each visit, thank you      Elayne Snare 08/12/2018, 9:35 AM

## 2018-08-12 NOTE — Patient Instructions (Addendum)
Less cereal  Check blood sugars on waking up days a week  Also check blood sugars about 2 hours after meals and do this after different meals by rotation  Recommended blood sugar levels on waking up are 90-130 and about 2 hours after meal is 130-160  Please bring your blood sugar monitor to each visit, thank you  Take a walk daily   Take amlodipine 1/2 pill

## 2018-08-23 DIAGNOSIS — I509 Heart failure, unspecified: Secondary | ICD-10-CM | POA: Diagnosis not present

## 2018-08-23 DIAGNOSIS — G4733 Obstructive sleep apnea (adult) (pediatric): Secondary | ICD-10-CM | POA: Diagnosis not present

## 2018-08-23 DIAGNOSIS — J188 Other pneumonia, unspecified organism: Secondary | ICD-10-CM | POA: Diagnosis not present

## 2018-09-22 DIAGNOSIS — I509 Heart failure, unspecified: Secondary | ICD-10-CM | POA: Diagnosis not present

## 2018-09-22 DIAGNOSIS — G4733 Obstructive sleep apnea (adult) (pediatric): Secondary | ICD-10-CM | POA: Diagnosis not present

## 2018-09-22 DIAGNOSIS — J188 Other pneumonia, unspecified organism: Secondary | ICD-10-CM | POA: Diagnosis not present

## 2018-10-21 DIAGNOSIS — G4733 Obstructive sleep apnea (adult) (pediatric): Secondary | ICD-10-CM | POA: Diagnosis not present

## 2018-10-23 ENCOUNTER — Telehealth: Payer: Self-pay | Admitting: Endocrinology

## 2018-10-23 ENCOUNTER — Other Ambulatory Visit: Payer: Self-pay | Admitting: Internal Medicine

## 2018-10-23 ENCOUNTER — Other Ambulatory Visit: Payer: Self-pay

## 2018-10-23 DIAGNOSIS — G4733 Obstructive sleep apnea (adult) (pediatric): Secondary | ICD-10-CM | POA: Diagnosis not present

## 2018-10-23 DIAGNOSIS — J188 Other pneumonia, unspecified organism: Secondary | ICD-10-CM | POA: Diagnosis not present

## 2018-10-23 DIAGNOSIS — I509 Heart failure, unspecified: Secondary | ICD-10-CM | POA: Diagnosis not present

## 2018-10-23 MED ORDER — METFORMIN HCL ER 750 MG PO TB24: ORAL_TABLET | ORAL | 0 refills | Status: DC

## 2018-10-23 NOTE — Telephone Encounter (Signed)
Rx sent 

## 2018-10-23 NOTE — Telephone Encounter (Signed)
MEDICATION: metFORMIN (GLUCOPHAGE-XR) 750 MG 24 hr tablet  PHARMACY:  [id number]  IS THIS A 90 DAY SUPPLY :   IS PATIENT OUT OF MEDICATION:   IF NOT; HOW MUCH IS LEFT: 5 days left  LAST APPOINTMENT DATE: @3 /30/2020  NEXT APPOINTMENT DATE:@7 /27/2020  DO WE HAVE YOUR PERMISSION TO LEAVE A DETAILED MESSAGE:  OTHER COMMENTS:    **Let patient know to contact pharmacy at the end of the day to make sure medication is ready. **  ** Please notify patient to allow 48-72 hours to process**  **Encourage patient to contact the pharmacy for refills or they can request refills through [facility]**

## 2018-10-24 ENCOUNTER — Ambulatory Visit: Payer: Medicare HMO | Admitting: Neurology

## 2018-11-04 ENCOUNTER — Other Ambulatory Visit: Payer: Self-pay

## 2018-11-04 ENCOUNTER — Telehealth: Payer: Self-pay | Admitting: Endocrinology

## 2018-11-04 MED ORDER — METFORMIN HCL 1000 MG PO TABS: ORAL_TABLET | ORAL | 3 refills | Status: DC

## 2018-11-04 NOTE — Telephone Encounter (Signed)
He will switch to regular metformin 1000 mg, half tablet at breakfast and 1 at dinner

## 2018-11-04 NOTE — Telephone Encounter (Signed)
Please advise 

## 2018-11-04 NOTE — Telephone Encounter (Signed)
Rx changed and new med sent. Attempted to call pt and inform him of this, but he did not answer the phone.

## 2018-11-04 NOTE — Telephone Encounter (Signed)
Patient stated that he received a paper in the mail from [facility] stating about the recall with metFORMIN (GLUCOPHAGE-XR) 750 MG 24 hr tablet.   Please Advise, Thanks

## 2018-11-17 NOTE — Progress Notes (Signed)
Reviewed and agree with assessment/plan.   Zen Cedillos, MD [place] [facility] 05/10/2016, 12:24 PM Pager:  [phone]  

## 2018-11-22 DIAGNOSIS — I509 Heart failure, unspecified: Secondary | ICD-10-CM | POA: Diagnosis not present

## 2018-11-22 DIAGNOSIS — G4733 Obstructive sleep apnea (adult) (pediatric): Secondary | ICD-10-CM | POA: Diagnosis not present

## 2018-11-22 DIAGNOSIS — J188 Other pneumonia, unspecified organism: Secondary | ICD-10-CM | POA: Diagnosis not present

## 2018-12-02 NOTE — Progress Notes (Signed)
NEUROLOGY CONSULTATION NOTE  DAVAN NAWABI MRN: [id number] DOB: 1945-06-17  Referring provider: Billey Gosling, MD Primary care provider: Billey Gosling, MD  Reason for consult:  Slurred speech, hand twitching  HISTORY OF PRESENT ILLNESS: Corey [name] is a 73 year old man with CAD, CKD, diabetes mellitus, hypertension, OSA and history of stroke and prostate cancer who presents for slurred speech and left hand twitching.  History supplemented by PCP note.  He had respiratory failure and cardiac arrest in May 2014.  He had a brief seizure at the time.  Since then, he has had intermittent slurred speech.  It comes and goes.  It occurs daily.  He has to think about what he wants to say.  Since the stroke, he also seems not to move his right hand when walking.  He has no stamina.  Balance is off.  He has short term memory problems. Cognition has been unchanged since the event.  About 6 months ago, his wife noticed that his right hand twitching.  It occurs daily and she only notices it when he is riding in the car.  No loss of consciousness or awareness.  When he had the PEA, his wife states he also had a stroke.  MRI of brain from 01/07/15 was personally reviewed and did demonstrate a remote left thalamic lacunar infarct as well as mild chronic small vessel ischemic changes in the bilateral cerebral white matter.   PAST MEDICAL HISTORY: Past Medical History:  Diagnosis Date   Cataract    Colon polyp 2003   Dr [name]; F/U was to be 2008( not completed)   CVA (cerebral infarction) 2014   Diabetes mellitus    Diverticulosis 2003   Dyspnea    with exertion   Gout    Hypertension    Myocardial infarction ([place]) 2014   OSA (obstructive sleep apnea) 03/27/2018   Pneumonia 09/29/11   Avelox X 10 days as OP   Prostate cancer ([place])    Renal insufficiency    Seizures ([place])    not treated for seizure disorder; had a seizure after stroke 2014; no seizure since then   Stroke [facility])      at same time he had MI   Urinary frequency     PAST SURGICAL HISTORY: Past Surgical History:  Procedure Laterality Date   COLONOSCOPY W/ POLYPECTOMY  2003   no F/U ([place] discussed 12/03/12)   INSERTION OF MESH N/A 07/16/2017   Procedure: INSERTION OF MESH;  Surgeon: [name], MD;  Location: [place] OR;  Service: General;  Laterality: N/A;   PEG PLACEMENT  2014   PEG TUBE REMOVAL  2014   PROSTATE BIOPSY     TRACHEOSTOMY  2014   was closed after hospital stay   [place] N/A 07/16/2017   Procedure: [place];  Surgeon: [name], MD;  Location: [place];  Service: General;  Laterality: N/A;   wrist aspiration  02/16/12    monosodium urate crystals; Dr Caralyn Guile    MEDICATIONS: Current Outpatient Medications on File Prior to Visit  Medication Sig Dispense Refill   ACCU-CHEK AVIVA PLUS test strip CHECK BLOOD SUGAR ONE TIME DAILY 100 each 1   amLODipine (NORVASC) 10 MG tablet Take 1 tablet (10 mg total) by mouth daily. 90 tablet 1   aspirin EC 81 MG tablet Take 1 tablet (81 mg total) by mouth daily.     Blood Glucose Monitoring Suppl (ACCU-CHEK AVIVA) device USE METER  TO CHECK BLOOD SUGAR 2 TIMES DAILY. 1 each 0   cloNIDine (CATAPRES) 0.3 MG tablet Take 1 tablet (0.3 mg total) by mouth 2 (two) times daily. 180 tablet 1   colchicine (COLCRYS) 0.6 MG tablet Take 2 tabs once and then one tab one hour later as needed for gout 60 tablet 1   glimepiride (AMARYL) 1 MG tablet Take 1 tablet daily before supper. 90 tablet 1   hydrALAZINE (APRESOLINE) 50 MG tablet Take 1 tablet (50 mg total) by mouth 3 (three) times daily. 270 tablet 1   isosorbide mononitrate (IMDUR) 60 MG 24 hr tablet TAKE 1 TABLET TWICE DAILY 180 tablet 1   Lancet Devices (ACCU-CHEK SOFTCLIX) lancets Use to test blood sugar 2 times daily- Dx code E11.29 1 each 3   losartan (COZAAR) 100 MG tablet Take 1 tablet (100 mg total) by mouth daily. 90 tablet 1    metFORMIN (GLUCOPHAGE) 1000 MG tablet Take 1/2 tablet by mouth in the morning and 1 tablet at dinner. 45 tablet 3   metoprolol succinate (TOPROL-XL) 100 MG 24 hr tablet Take 1 tablet (100 mg total) by mouth 2 (two) times daily. 180 tablet 1   oxybutynin (DITROPAN-XL) 10 MG 24 hr tablet Take 10 mg by mouth as needed.     No current facility-administered medications on file prior to visit.     ALLERGIES: Allergies  Allergen Reactions   Hydrochlorothiazide Other (See Comments)    Gout , uncontrolled diabetes and renal insufficiency   Allopurinol Other (See Comments)    Renal insufficiency    FAMILY HISTORY: Family History  Problem Relation Age of Onset   Hypertension Father    Prostate cancer Father    Diabetes Father    Cancer Father    Hypertension Mother    Diabetes Mother    Stroke Neg Hx    Heart disease Neg Hx    Colon cancer Neg Hx     SOCIAL HISTORY: Social History   Socioeconomic History   Marital status: Married    Spouse name: Not on file   Number of children: Not on file   Years of education: Not on file   Highest education level: Not on file  Occupational History   Not on file  Social Needs   Financial resource strain: Not on file   Food insecurity    Worry: Not on file    Inability: Not on file   Transportation needs    Medical: Not on file    Non-medical: Not on file  Tobacco Use   Smoking status: Former Smoker    Packs/day: 0.25    Years: 1.00    Pack years: 0.25    Types: Cigarettes    Quit date: 05/15/1968    Years since quitting: 50.5   Smokeless tobacco: Never Used   Tobacco comment: smoked [place], up to 1-2 cigarettes/ day  Substance and Sexual Activity   Alcohol use: No    Alcohol/week: 0.0 standard drinks   Drug use: No   Sexual activity: Yes  Lifestyle   Physical activity    Days per week: Not on file    Minutes per session: Not on file   Stress: Not on file  Relationships   Social connections      Talks on phone: Not on file    Gets together: Not on file    Attends religious service: Not on file    Active member of club or organization: Not on file  Attends meetings of clubs or organizations: Not on file    Relationship status: Not on file   Intimate partner violence    Fear of current or ex partner: Not on file    Emotionally abused: Not on file    Physically abused: Not on file    Forced sexual activity: Not on file  Other Topics Concern   Not on file  Social History Narrative   Walks three times a week, 2-3 miles, occasional walks on treadmill        REVIEW OF SYSTEMS: Constitutional: No fevers, chills, or sweats, no generalized fatigue, change in appetite Eyes: No visual changes, double vision, eye pain Ear, nose and throat: No hearing loss, ear pain, nasal congestion, sore throat Cardiovascular: No chest pain, palpitations Respiratory:  No shortness of breath at rest or with exertion, wheezes GastrointestinaI: No nausea, vomiting, diarrhea, abdominal pain, fecal incontinence Genitourinary:  No dysuria, urinary retention or frequency Musculoskeletal:  No neck pain, back pain Integumentary: No rash, pruritus, skin lesions Neurological: as above Psychiatric: No depression, insomnia, anxiety Endocrine: No palpitations, fatigue, diaphoresis, mood swings, change in appetite, change in weight, increased thirst Hematologic/Lymphatic:  No purpura, petechiae. Allergic/Immunologic: no itchy/runny eyes, nasal congestion, recent allergic reactions, rashes  PHYSICAL EXAM: Blood pressure (!) 179/85, pulse (!) 50, temperature 98.5 F (36.9 C), temperature source Oral, height 5\' 6"  (1.676 m), weight 179 lb (81.2 kg), SpO2 100 %. General: No acute distress.  Patient appears well-groomed.  Head:  Normocephalic/atraumatic Eyes:  fundi examined but not visualized Neck: supple, no paraspinal tenderness, full range of motion Back: No paraspinal tenderness Heart: regular rate  and rhythm Lungs: Clear to auscultation bilaterally. Vascular: No carotid bruits. Neurological Exam: Mental status: alert and oriented to person, place, and time, recent and remote memory intact, fund of knowledge intact, attention and concentration fair, speech fluent and not dysarthric, language intact. Cranial nerves: CN I: not tested CN II: pupils equal, round and reactive to light, visual fields intact CN III, IV, VI:  full range of motion, no nystagmus, no ptosis CN V: facial sensation intact CN VII: upper and lower face symmetric CN VIII: decreased hearing bilaterally CN IX, X: gag intact, uvula midline CN XI: sternocleidomastoid and trapezius muscles intact CN XII: tongue midline Bulk & Tone: normal, no fasciculations. Motor:  5/5 throughout  Sensation: temperature and vibration sensation intact. Deep Tendon Reflexes:  2+ throughout, toes downgoing.  Finger to nose testing:  Without dysmetria.  Heel to shin:  Without dysmetria.  Gait:  Mildly unsteady..  Able to turn, unable to tandem walk. Romberg with sway.  IMPRESSION: 1.  Cognitive impairment secondary to anoxic brain injury.  No clear stroke documented at time of hospitalization.  He did have a brain MRI a couple of years later that demonstrated an old left thalamic infarct.  Reported slurred speech appears to correlate with patient having word-finding difficulty.  No specific recommendations except managing stroke risk factors (ASA, blood pressure control, glycemic control, cholesterol control) 2.  Right hand twitching.  I would like to evaluate for simple partial seizure.  As these symptoms are new, will repeat MRI of brain to look for any secondary etiology and get an EEG.  PLAN: 1.  MRI of brain  2.  EEG 3.  Follow up with PCP regarding blood pressure. 4.  Further recommendations pending results.  Thank you for allowing me to take part in the care of this patient.  Metta Clines, DO  CC: Billey Gosling, MD

## 2018-12-03 ENCOUNTER — Encounter: Payer: Self-pay | Admitting: Neurology

## 2018-12-03 ENCOUNTER — Ambulatory Visit (INDEPENDENT_AMBULATORY_CARE_PROVIDER_SITE_OTHER): Payer: Medicare HMO | Admitting: Neurology

## 2018-12-03 ENCOUNTER — Other Ambulatory Visit: Payer: Self-pay

## 2018-12-03 VITALS — BP 179/85 | HR 50 | Temp 98.5°F | Ht 66.0 in | Wt 179.0 lb

## 2018-12-03 DIAGNOSIS — G931 Anoxic brain damage, not elsewhere classified: Secondary | ICD-10-CM

## 2018-12-03 DIAGNOSIS — R4189 Other symptoms and signs involving cognitive functions and awareness: Secondary | ICD-10-CM | POA: Diagnosis not present

## 2018-12-03 DIAGNOSIS — R4781 Slurred speech: Secondary | ICD-10-CM | POA: Diagnosis not present

## 2018-12-03 DIAGNOSIS — Z8673 Personal history of transient ischemic attack (TIA), and cerebral infarction without residual deficits: Secondary | ICD-10-CM

## 2018-12-03 DIAGNOSIS — I10 Essential (primary) hypertension: Secondary | ICD-10-CM | POA: Diagnosis not present

## 2018-12-03 DIAGNOSIS — R253 Fasciculation: Secondary | ICD-10-CM

## 2018-12-03 NOTE — Patient Instructions (Addendum)
I think the cognitive problems are due to the stroke and heart attack To further evaluate hand twitching, we will check MRI of brain and EEG Further recommendations pending results.  We have sent a referral to [facility] for your MRI and they will call you directly to schedule your appointment. They are located at [place]. If you need to contact them directly please call [phone].

## 2018-12-04 ENCOUNTER — Telehealth: Payer: Self-pay | Admitting: *Deleted

## 2018-12-04 NOTE — Telephone Encounter (Signed)
error 

## 2018-12-04 NOTE — Telephone Encounter (Signed)
LMOM to call to make EEG appointment. Said we may have an opening today or Monday.

## 2018-12-05 ENCOUNTER — Encounter: Payer: Self-pay | Admitting: Internal Medicine

## 2018-12-05 DIAGNOSIS — G3184 Mild cognitive impairment, so stated: Secondary | ICD-10-CM | POA: Insufficient documentation

## 2018-12-09 ENCOUNTER — Other Ambulatory Visit: Payer: Self-pay

## 2018-12-09 ENCOUNTER — Other Ambulatory Visit: Payer: Medicare HMO

## 2018-12-09 ENCOUNTER — Ambulatory Visit (INDEPENDENT_AMBULATORY_CARE_PROVIDER_SITE_OTHER): Payer: Medicare HMO | Admitting: Neurology

## 2018-12-09 ENCOUNTER — Ambulatory Visit: Payer: Medicare HMO | Admitting: Pulmonary Disease

## 2018-12-09 DIAGNOSIS — R253 Fasciculation: Secondary | ICD-10-CM | POA: Diagnosis not present

## 2018-12-10 ENCOUNTER — Telehealth: Payer: Self-pay

## 2018-12-10 NOTE — Telephone Encounter (Signed)
-----   Message from Pieter Partridge, DO sent at 12/10/2018 12:37 PM EDT ----- EEG is normal

## 2018-12-10 NOTE — Telephone Encounter (Signed)
Called spoke with patient he was made aware of results

## 2018-12-10 NOTE — Procedures (Signed)
ELECTROENCEPHALOGRAM REPORT  Date of Study: 12/09/2018  Patient's Name: Corey [name] MRN: [id number] Date of Birth: 1945-11-25   Clinical History: 73 year old man with history of anoxic brain injury who presents with right hand twitching.  Medications: NORVASC 10 MG tablet aspirin EC 81 MG tablet CATAPRES 0.3 MG tablet COLCRYS 0.6 MG tablet AMARYL 1 MG tablet APRESOLINE 50 MG tablet IMDUR 60 MG 24 hr tablet COZAAR 100 MG tablet GLUCOPHAGE 1000 MG tablet TOPROL-XL 100 MG 24 hr tablet DITROPAN-XL 10 MG 24 hr tablet  Technical Summary: A multichannel digital EEG recording measured by the international 10-20 system with electrodes applied with paste and impedances below 5000 ohms performed in our laboratory with EKG monitoring in an awake and asleep patient.  Hyperventilation not performed.  Photic stimulation was performed.  The digital EEG was referentially recorded, reformatted, and digitally filtered in a variety of bipolar and referential montages for optimal display.    Description: The patient is awake and asleep during the recording.  During maximal wakefulness, there is a symmetric, medium voltage 8-9 Hz posterior dominant rhythm that attenuates with eye opening.  The record is symmetric.  During drowsiness and sleep, there is an increase in theta slowing of the background.  Vertex waves and symmetric sleep spindles were seen.  Photic stimulation did not elicit any abnormalities.  There were no epileptiform discharges or electrographic seizures seen.    EKG lead was unremarkable.  Impression: This awake and asleep EEG is normal.    Clinical Correlation: A normal EEG does not exclude a clinical diagnosis of epilepsy.  If further clinical questions remain, prolonged EEG may be helpful.  Clinical correlation is advised.   Metta Clines, DO

## 2018-12-11 ENCOUNTER — Ambulatory Visit: Payer: Medicare HMO | Admitting: Endocrinology

## 2018-12-12 ENCOUNTER — Ambulatory Visit: Payer: Medicare HMO | Admitting: Pulmonary Disease

## 2018-12-12 ENCOUNTER — Other Ambulatory Visit: Payer: Self-pay

## 2018-12-12 ENCOUNTER — Encounter: Payer: Self-pay | Admitting: Pulmonary Disease

## 2018-12-12 VITALS — BP 148/82 | HR 74 | Temp 98.2°F | Ht 66.0 in | Wt 180.6 lb

## 2018-12-12 DIAGNOSIS — Z9989 Dependence on other enabling machines and devices: Secondary | ICD-10-CM

## 2018-12-12 DIAGNOSIS — G4733 Obstructive sleep apnea (adult) (pediatric): Secondary | ICD-10-CM | POA: Diagnosis not present

## 2018-12-12 NOTE — Patient Instructions (Signed)
Will arrange for a chin to use with your CPAP mask  Follow up in 1 year

## 2018-12-12 NOTE — Progress Notes (Signed)
Pulmonary, Critical Care, and Sleep Medicine  Chief Complaint  Patient presents with  . Follow-up    F/U re: Cpap.     Constitutional:  BP (!) 148/82   Pulse 74   Temp 98.2 F (36.8 C) (Oral)   Ht 5\' 6"  (1.676 m)   Wt 180 lb 9.6 oz (81.9 kg)   SpO2 98%   BMI 29.15 kg/m   Past Medical History:  Cataract, Colon polyp, CVA 2014, DM, Diverticulosis, Gout, HTN, CAD, PNA, Prostate cancer, CKD, Seizures, PEA cardiac arrest 2016  Brief Summary:  Corey [name] is a 73 y.o. male with obstructive sleep apnea.  He uses CPAP nightly.  Feels his sleep is better, and his concentration has improved.  He has been using nasal pillows.  Likes this mask, but has leak around nasal prongs.  Mouth also gets very dry at night.  Not having sore throat, sinus congestion, or aerophagia.   Physical Exam:   Appearance - well kempt   ENMT - no sinus tenderness, no nasal discharge, no oral exudate, MP 4, enlarged tongue, poor dentition  Neck - no masses, trachea midline, no thyromegaly, no elevation in JVP  Respiratory - normal appearance of chest wall, normal respiratory effort w/o accessory muscle use, no dullness on percussion, no wheezing or rales  CV - s1s2 regular rate and rhythm, no murmurs, no peripheral edema, radial pulses symmetric  GI - soft, non tender  Lymph - no adenopathy noted in neck and axillary areas  MSK - normal gait  Ext - no cyanosis, clubbing, or joint inflammation noted  Skin - no rashes, lesions, or ulcers  Neuro - normal strength, oriented x 3  Psych - normal mood and affect   Assessment/Plan:   Obstructive sleep apnea. - he reports compliance with CPAP and benefit from therapy - main issue seems to be related to mask fit and air leak - continue nasal pillows mask and arrange for chin strap - continue auto CPAP  - if mask leak continues to be a problem, then options are 1) refit mask, and/or 2) adjust pressure setting down  Cognitive impairment. -  followed by Dr. Tomi [name] with neurology - discussed how untreated sleep apnea can impact cognitive function, and emphasized importance of compliance with CPAP   Patient Instructions  Will arrange for a chin to use with your CPAP mask  Follow up in 1 year   Corey Mires, MD [place] Pager: [phone] 12/12/2018, 9:22 AM  Flow Sheet    Sleep tests:  HST 03/22/18 >> AHI 26.6, SpO2 low 81% Auto CPAP 626/20 to 12/10/18 >> used on 30 of 30 nights with average 5 hrs 26 min.  Average AHI 27.7 with median CPAP 9 and 95 th percentile CPAP 12 cm H2O.  Significant air leak.  Cardiac tests:  Echo 05/30/17 >> EF 60 to 65%, grade 3 DD  Medications:   Allergies as of 12/12/2018      Reactions   Hydrochlorothiazide Other (See Comments)   Gout , uncontrolled diabetes and renal insufficiency   Allopurinol Other (See Comments)   Renal insufficiency      Medication List       Accurate as of December 12, 2018  9:22 AM. If you have any questions, ask your nurse or doctor.        Accu-Chek Aviva device USE METER TO CHECK BLOOD SUGAR 2 TIMES DAILY.   Accu-Chek Aviva Plus test strip Generic drug: glucose blood CHECK BLOOD SUGAR ONE  TIME DAILY   accu-chek softclix lancets Use to test blood sugar 2 times daily- Dx code E11.29   amLODipine 10 MG tablet Commonly known as: NORVASC Take 1 tablet (10 mg total) by mouth daily.   aspirin EC 81 MG tablet Take 1 tablet (81 mg total) by mouth daily.   cloNIDine 0.3 MG tablet Commonly known as: Catapres Take 1 tablet (0.3 mg total) by mouth 2 (two) times daily.   colchicine 0.6 MG tablet Commonly known as: Colcrys Take 2 tabs once and then one tab one hour later as needed for gout   glimepiride 1 MG tablet Commonly known as: AMARYL Take 1 tablet daily before supper.   hydrALAZINE 50 MG tablet Commonly known as: APRESOLINE Take 1 tablet (50 mg total) by mouth 3 (three) times daily.   isosorbide mononitrate 60 MG 24 hr  tablet Commonly known as: IMDUR TAKE 1 TABLET TWICE DAILY   losartan 100 MG tablet Commonly known as: COZAAR Take 1 tablet (100 mg total) by mouth daily.   metFORMIN 1000 MG tablet Commonly known as: GLUCOPHAGE Take 1/2 tablet by mouth in the morning and 1 tablet at dinner.   metoprolol succinate 100 MG 24 hr tablet Commonly known as: TOPROL-XL Take 1 tablet (100 mg total) by mouth 2 (two) times daily.   oxybutynin 10 MG 24 hr tablet Commonly known as: DITROPAN-XL Take 10 mg by mouth as needed.       Past Surgical History:  He  has a past surgical history that includes Colonoscopy w/ polypectomy (2003); wrist aspiration (02/16/12); PEG placement (2014); PEG tube removal (2014); Tracheostomy (2014); Prostate biopsy; Ventral hernia repair (N/A, 07/16/2017); and Insertion of mesh (N/A, 07/16/2017).  Family History:  His family history includes Cancer in his father; Diabetes in his father and mother; Hypertension in his father and mother; Prostate cancer in his father.  Social History:  He  reports that he quit smoking about 50 years ago. His smoking use included cigarettes. He has a 0.25 pack-year smoking history. He has never used smokeless tobacco. He reports that he does not drink alcohol or use drugs.

## 2018-12-13 DIAGNOSIS — G4733 Obstructive sleep apnea (adult) (pediatric): Secondary | ICD-10-CM | POA: Diagnosis not present

## 2018-12-20 ENCOUNTER — Other Ambulatory Visit: Payer: Self-pay

## 2018-12-20 ENCOUNTER — Other Ambulatory Visit (INDEPENDENT_AMBULATORY_CARE_PROVIDER_SITE_OTHER): Payer: Medicare HMO

## 2018-12-20 DIAGNOSIS — E1165 Type 2 diabetes mellitus with hyperglycemia: Secondary | ICD-10-CM

## 2018-12-20 LAB — BASIC METABOLIC PANEL
BUN: 25 mg/dL — ABNORMAL HIGH (ref 6–23)
CO2: 24 mEq/L (ref 19–32)
Calcium: 10 mg/dL (ref 8.4–10.5)
Chloride: 112 mEq/L (ref 96–112)
Creatinine, Ser: 1.58 mg/dL — ABNORMAL HIGH (ref 0.40–1.50)
GFR: 52.62 mL/min — ABNORMAL LOW (ref >60.00)
Glucose, Bld: 92 mg/dL (ref 70–99)
Potassium: 4.4 mEq/L (ref 3.5–5.1)
Sodium: 142 mEq/L (ref 135–145)

## 2018-12-20 LAB — HEMOGLOBIN A1C: Hgb A1c MFr Bld: 6.6 % — ABNORMAL HIGH (ref 4.6–6.5)

## 2018-12-20 LAB — MICROALBUMIN / CREATININE URINE RATIO
Creatinine,U: 102.6 mg/dL
Microalb Creat Ratio: 2.4 mg/g (ref 0.0–30.0)
Microalb, Ur: 2.5 mg/dL — ABNORMAL HIGH (ref 0.0–1.9)

## 2018-12-23 ENCOUNTER — Encounter: Payer: Self-pay | Admitting: Endocrinology

## 2018-12-23 DIAGNOSIS — G4733 Obstructive sleep apnea (adult) (pediatric): Secondary | ICD-10-CM | POA: Diagnosis not present

## 2018-12-23 DIAGNOSIS — J188 Other pneumonia, unspecified organism: Secondary | ICD-10-CM | POA: Diagnosis not present

## 2018-12-23 DIAGNOSIS — I509 Heart failure, unspecified: Secondary | ICD-10-CM | POA: Diagnosis not present

## 2018-12-24 ENCOUNTER — Other Ambulatory Visit: Payer: Self-pay

## 2018-12-24 ENCOUNTER — Ambulatory Visit (INDEPENDENT_AMBULATORY_CARE_PROVIDER_SITE_OTHER): Payer: Medicare HMO | Admitting: Endocrinology

## 2018-12-24 ENCOUNTER — Encounter: Payer: Self-pay | Admitting: Endocrinology

## 2018-12-24 VITALS — BP 132/68 | HR 54 | Ht 66.0 in | Wt 180.6 lb

## 2018-12-24 DIAGNOSIS — E1165 Type 2 diabetes mellitus with hyperglycemia: Secondary | ICD-10-CM | POA: Diagnosis not present

## 2018-12-24 NOTE — Patient Instructions (Signed)
Stop GLIMEPERIDE   Check blood sugars on waking up 2-3 days a week  Also check blood sugars about 2 hours after meals and do this after different meals by rotation  Recommended blood sugar levels on waking up are 80-120 and about 2 hours after meal is 130-160  Please bring your blood sugar monitor to each visit, thank you

## 2018-12-24 NOTE — Progress Notes (Signed)
Patient ID: Corey [name], male   DOB: Aug 06, 1945, 73 y.o.   MRN: [id number]   Reason for Appointment : Followup of Type 2 Diabetes   History of Present Illness          Diagnosis: Type 2 diabetes mellitus, date of diagnosis: 1997      Past history: The patient was initially treated with various oral hypoglycemic drugs and details are not available. About 2009 he was started on insulin and has either taken premixed insulin or Lantus once a day He had difficulty affording his Lantus insulin and prefers to take premixed insulin  Recent history:    Oral hypoglycemic drugs: Metformin ER 750 mg twice daily, glimepiride 1 mg daily at dinner    Current blood sugar patterns and problems:  His A1c is 6.6, previously 6.8 and progressively lower now  He was asked to make sure his test strips were not out of date and he still is not sure  He has been checking mostly around 9 PM and 2 readings below 70 although he does not think he feels weak or shaky at that time  He has not changed his medications  However he appears to have lost 17 pounds and he does not know why  He was told to have more protein in the mornings and not eating eggs with his grits in the morning  Usually has only some kind of crackers at lunchtime and not a meal  He has increased his walking up to 4 times a day and usually at least 20 minutes, sometimes 30  His blood sugar in the lab was also low normal at 92 fasting, today's home fasting reading was 70  Currently not having any unusual fatigue or weakness or nausea with taking metformin  Creatinine is slightly higher than before    Glucose monitoring:  done less than 1 time a day         Glucometer:  Accu-Chek      Blood Glucose readings by monitor download:    PRE-MEAL Fasting Lunch Dinner Bedtime Overall  Glucose range:  70  108     Mean/median:      82   POST-MEAL PC Breakfast PC Lunch PC Dinner  Glucose range:  86, 87   57-103  Mean/median:     79    PREVIOUS readings:  PRE-MEAL Fasting Lunch Dinner Bedtime Overall  Glucose range:  85-105   70, 77  79   Mean/median:  94     104   POST-MEAL PC Breakfast PC Lunch PC Dinner  Glucose range:    179  Mean/median:        Self-care:       Meals: 2-3 meals per day. Egg in am at 9-10 am or grits , rarely sandwich at lunch. His largest meal is at suppertime at about 8 pm.  Avoiding regular soft drinks  Physical activity: exercise: Walking 30 minutes; 4/7  days        Dietician visit: Most recent: 02/2014               Compliance with the medical regimen:  Fair.    Wt Readings from Last 3 Encounters:  12/24/18 180 lb 9.6 oz (81.9 kg)  12/12/18 180 lb 9.6 oz (81.9 kg)  12/03/18 179 lb (81.2 kg)    Lab Results  Component Value Date   HGBA1C 6.6 (H) 12/20/2018   HGBA1C 6.8 (H) 07/23/2018   HGBA1C 7.2 (H) 11/29/2017  Lab Results  Component Value Date   MICROALBUR 2.5 (H) 12/20/2018   LDLCALC 53 07/23/2018   CREATININE 1.58 (H) 12/20/2018   Lab on 12/20/2018  Component Date Value Ref Range Status  . Microalb, Ur 12/20/2018 2.5* 0.0 - 1.9 mg/dL Final  . Creatinine,U 12/20/2018 102.6  mg/dL Final  . Microalb Creat Ratio 12/20/2018 2.4  0.0 - 30.0 mg/g Final  . Sodium 12/20/2018 142  135 - 145 mEq/L Final  . Potassium 12/20/2018 4.4  3.5 - 5.1 mEq/L Final  . Chloride 12/20/2018 112  96 - 112 mEq/L Final  . CO2 12/20/2018 24  19 - 32 mEq/L Final  . Glucose, Bld 12/20/2018 92  70 - 99 mg/dL Final  . BUN 12/20/2018 25* 6 - 23 mg/dL Final  . Creatinine, Ser 12/20/2018 1.58* 0.40 - 1.50 mg/dL Final  . Calcium 12/20/2018 10.0  8.4 - 10.5 mg/dL Final  . GFR 12/20/2018 52.62* >60.00 mL/min Final  . Hgb A1c MFr Bld 12/20/2018 6.6* 4.6 - 6.5 % Final   Glycemic Control Guidelines for People with Diabetes:Non Diabetic:  <6%Goal of Therapy: <7%Additional Action Suggested:  >8%      Allergies as of 12/24/2018      Reactions   Hydrochlorothiazide Other (See Comments)   Gout  , uncontrolled diabetes and renal insufficiency   Allopurinol Other (See Comments)   Renal insufficiency      Medication List       Accurate as of December 24, 2018  8:54 AM. If you have any questions, ask your nurse or doctor.        Accu-Chek Aviva device USE METER TO CHECK BLOOD SUGAR 2 TIMES DAILY.   Accu-Chek Aviva Plus test strip Generic drug: glucose blood CHECK BLOOD SUGAR ONE TIME DAILY   accu-chek softclix lancets Use to test blood sugar 2 times daily- Dx code E11.29   amLODipine 10 MG tablet Commonly known as: NORVASC Take 1 tablet (10 mg total) by mouth daily.   aspirin EC 81 MG tablet Take 1 tablet (81 mg total) by mouth daily.   cloNIDine 0.3 MG tablet Commonly known as: Catapres Take 1 tablet (0.3 mg total) by mouth 2 (two) times daily.   colchicine 0.6 MG tablet Commonly known as: Colcrys Take 2 tabs once and then one tab one hour later as needed for gout   glimepiride 1 MG tablet Commonly known as: AMARYL Take 1 tablet daily before supper.   hydrALAZINE 50 MG tablet Commonly known as: APRESOLINE Take 1 tablet (50 mg total) by mouth 3 (three) times daily.   isosorbide mononitrate 60 MG 24 hr tablet Commonly known as: IMDUR TAKE 1 TABLET TWICE DAILY   losartan 100 MG tablet Commonly known as: COZAAR Take 1 tablet (100 mg total) by mouth daily.   metFORMIN 1000 MG tablet Commonly known as: GLUCOPHAGE Take 1/2 tablet by mouth in the morning and 1 tablet at dinner.   metoprolol succinate 100 MG 24 hr tablet Commonly known as: TOPROL-XL Take 1 tablet (100 mg total) by mouth 2 (two) times daily.   oxybutynin 10 MG 24 hr tablet Commonly known as: DITROPAN-XL Take 10 mg by mouth as needed.       Allergies:  Allergies  Allergen Reactions  . Hydrochlorothiazide Other (See Comments)    Gout , uncontrolled diabetes and renal insufficiency  . Allopurinol Other (See Comments)    Renal insufficiency    Past Medical History:  Diagnosis Date   . Cataract   . Colon  polyp 2003   Dr [name]; F/U was to be 2008( not completed)  . CVA (cerebral infarction) 2014  . Diabetes mellitus   . Diverticulosis 2003  . Dyspnea    with exertion  . Gout   . Hypertension   . Myocardial infarction ([place]) 2014  . OSA (obstructive sleep apnea) 03/27/2018  . Pneumonia 09/29/11   Avelox X 10 days as OP  . Prostate cancer ([place])   . Renal insufficiency   . Seizures ([place])    not treated for seizure disorder; had a seizure after stroke 2014; no seizure since then  . Stroke [facility])    at same time he had MI  . Urinary frequency     Past Surgical History:  Procedure Laterality Date  . COLONOSCOPY W/ POLYPECTOMY  2003   no F/U ([place] discussed 12/03/12)  . INSERTION OF MESH N/A 07/16/2017   Procedure: INSERTION OF MESH;  Surgeon: [name], MD;  Location: [facility];  Service: General;  Laterality: N/A;  . PEG PLACEMENT  2014  . PEG TUBE REMOVAL  2014  . PROSTATE BIOPSY    . TRACHEOSTOMY  2014   was closed after hospital stay  . VENTRAL HERNIA REPAIR N/A 07/16/2017   Procedure: LAPAROSCOPIC VENTRAL HERNIA REPAIR WITH MESH;  Surgeon: [name], MD;  [facility];  Service: General;  Laterality: N/A;  . wrist aspiration  02/16/12    monosodium urate crystals; Dr Caralyn Guile    Family History  Problem Relation Age of Onset  . Hypertension Father   . Prostate cancer Father   . Diabetes Father   . Cancer Father   . Hypertension Mother   . Diabetes Mother   . Stroke Neg Hx   . Heart disease Neg Hx   . Colon cancer Neg Hx     Social History:  reports that he quit smoking about 50 years ago. His smoking use included cigarettes. He has a 0.25 pack-year smoking history. He has never used smokeless tobacco. He reports that he does not drink alcohol or use drugs.    Review of Systems   Lipids: Normal and  not on a statin drug  Lab Results  Component Value Date   CHOL 112 07/23/2018   HDL 48.00 07/23/2018   LDLCALC 53 07/23/2018    TRIG 58.0 07/23/2018   CHOLHDL 2 07/23/2018    Eye exams: Recent exam reports not available    He has mild renal insufficiency of unclear etiology with variable creatinine levels as below:  Lab Results  Component Value Date   CREATININE 1.58 (H) 12/20/2018   CREATININE 1.37 07/23/2018   CREATININE 1.47 11/29/2017     HYPERTENSION: Currently on losartan, amlodipine 5, clonidine 0.3 mg and hydralazine as well as metoprolol His amlodipine was reduced on his last visit when his blood pressure was low normal He has been followed by PCP for this  BP Readings from Last 3 Encounters:  12/24/18 132/68  12/12/18 (!) 148/82  12/03/18 (!) 179/85    Foot exam done in 3/20 with PCP  Physical Examination:  BP 132/68 (BP Location: Left Arm, Patient Position: Sitting, Cuff Size: Normal)   Pulse (!) 54   Ht 5\' 6"  (1.676 m)   Wt 180 lb 9.6 oz (81.9 kg)   SpO2 96%   BMI 29.15 kg/m     ASSESSMENT/PLAN:  Diabetes type 2, uncontrolled   See history of present illness for discussion of current diabetes management, blood sugar patterns and  problems identified  His A1c is slightly better at 6.6  He is on metformin and Amaryl 1 mg  He is having a couple of asymptomatic blood sugars below 70 at home at night Also his other blood sugars are mostly below 100 at home and also in the lab fasting  Lower sugars may be related to his weight loss as well as slightly worse renal function  He has done some readings after meals also which do not appear to be high He is likely eating smaller portions and with his exercise regimen also has lost weight  Recommend that we stop Amaryl because of tendency to low sugars We will check to make sure he still strips are not outdated   HYPERTENSION: On multiple relatively high dose medications Blood pressure is controlled and he will continue to follow-up with PCP and other physicians  Renal dysfunction: Relatively worse and he will need to discuss  this with his PCP Currently microalbumin is normal  Follow-up in 3 months   There are no Patient Instructions on file for this visit.   Elayne Snare 12/24/2018, 8:54 AM

## 2019-01-01 ENCOUNTER — Telehealth: Payer: Self-pay | Admitting: Endocrinology

## 2019-01-01 NOTE — Telephone Encounter (Signed)
[employer] is calling in regards to paperwork for an RX. Will be refaxing today to [phone].

## 2019-01-01 NOTE — Telephone Encounter (Signed)
Unaware of paperwork for this patient, but will be watching for the refax.

## 2019-01-02 ENCOUNTER — Telehealth: Payer: Self-pay

## 2019-01-02 ENCOUNTER — Other Ambulatory Visit: Payer: Self-pay

## 2019-01-02 MED ORDER — METFORMIN HCL ER 750 MG PO TB24: 750.0000 mg | ORAL_TABLET | Freq: Two times a day (BID) | ORAL | 1 refills | Status: DC

## 2019-01-02 NOTE — Telephone Encounter (Signed)
He will stay with metformin ER 750 unless this is not available

## 2019-01-02 NOTE — Telephone Encounter (Signed)
Please clarify.  Pt is requesting a refill of Metformin 1000mg  tablets. This is also the medication that was listed on his med list, but last office visit note states that pt is taking Metformin 750 ER, 1 tab BID. Which would you like pt to be on?

## 2019-01-02 NOTE — Telephone Encounter (Signed)
Rx for 750mg  Er tabs BID sent to pharmacy.

## 2019-01-10 ENCOUNTER — Other Ambulatory Visit: Payer: Self-pay | Admitting: Internal Medicine

## 2019-01-21 DIAGNOSIS — G4733 Obstructive sleep apnea (adult) (pediatric): Secondary | ICD-10-CM | POA: Diagnosis not present

## 2019-01-22 NOTE — Progress Notes (Signed)
Subjective:    Patient ID: Corey [name], male    DOB: 16-Jan-1946, 73 y.o.   MRN: [id number]  HPI The patient is here for follow up.  He is here with his wife.    He is not exercising regularly.    CAD, Hypertension: He is taking his medication daily. He is compliant with a low sodium diet.  He denies chest pain, palpitations, edema, shortness of breath and regular headaches.      Diabetes: He sees Dr Dwyane Dee.  He is taking his medication daily as prescribed. He is compliant with a diabetic diet.   He checks his feet daily and denies foot lesions. He is up-to-date with an ophthalmology examination.   CKD:  He does not take any nsaids.  He drinks water during the day.   Gout:  He takes colchicine as needed.   He has two flares a day.     Review of Systems  Constitutional: Negative for chills and fever.  Respiratory: Positive for shortness of breath (when he walks a lot). Negative for cough and wheezing.   Cardiovascular: Negative for chest pain, palpitations and leg swelling.  Neurological: Negative for dizziness and headaches.    Medications and allergies reviewed with patient and updated if appropriate.  Patient Active Problem List   Diagnosis Date Noted  . Mild cognitive impairment 12/05/2018  . Slurred speech 07/23/2018  . Muscle twitching 07/23/2018  . OSA (obstructive sleep apnea) 03/27/2018  . Snores 01/21/2018  . Poor balance 01/23/2017  . Ankle swelling, left 09/06/2016  . Bradycardia 01/28/2016  . Weak urinary stream 12/31/2015  . Chronic kidney disease 09/15/2015  . Ventral hernia without obstruction or gangrene 09/15/2015  . Prostate cancer ([place]) 07/25/2015  . Erectile dysfunction 05/04/2015  . Urinary frequency 05/04/2015  . 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 Abnormal finding on MRI of brain 02/14/2015  . PEA (Pulseless electrical activity) ([place]) 12/03/2012  . Diabetes ([place]) 06/30/2011  . Gout 03/05/2007  . Essential  hypertension 03/05/2007    Current Outpatient Medications on File Prior to Visit  Medication Sig Dispense Refill  . ACCU-CHEK AVIVA PLUS test strip CHECK BLOOD SUGAR ONE TIME DAILY 100 each 1  . amLODipine (NORVASC) 10 MG tablet Take 1 tablet (10 mg total) by mouth daily. 90 tablet 1  . aspirin EC 81 MG tablet Take 1 tablet (81 mg total) by mouth daily.    . Blood Glucose Monitoring Suppl (ACCU-CHEK AVIVA) device USE METER TO CHECK BLOOD SUGAR 2 TIMES DAILY. 1 each 0  . cloNIDine (CATAPRES) 0.3 MG tablet TAKE 1 TABLET TWICE DAILY 180 tablet 1  . colchicine (COLCRYS) 0.6 MG tablet Take 2 tabs once and then one tab one hour later as needed for gout 60 tablet 1  . glimepiride (AMARYL) 1 MG tablet Take 1 tablet daily before supper. 90 tablet 1  . hydrALAZINE (APRESOLINE) 50 MG tablet Take 1 tablet (50 mg total) by mouth 3 (three) times daily. 270 tablet 1  . isosorbide mononitrate (IMDUR) 60 MG 24 hr tablet TAKE 1 TABLET TWICE DAILY 180 tablet 1  . Lancet Devices (ACCU-CHEK SOFTCLIX) lancets Use to test blood sugar 2 times daily- Dx code E11.29 1 each 3  . losartan (COZAAR) 100 MG tablet TAKE 1 TABLET (100 MG TOTAL) BY MOUTH DAILY. 90 tablet 1  . metFORMIN (GLUCOPHAGE-XR) 750 MG 24 hr tablet Take 1 tablet (750 mg total) by mouth 2 (two) times daily. Take 1 tablet by  mouth twice daily. 180 tablet 1  . metoprolol succinate (TOPROL-XL) 100 MG 24 hr tablet TAKE 1 TABLET TWICE DAILY 180 tablet 1  . oxybutynin (DITROPAN-XL) 10 MG 24 hr tablet Take 10 mg by mouth as needed.     No current facility-administered medications on file prior to visit.     Past Medical History:  Diagnosis Date  . Cataract   . Colon polyp 2003   Dr [name]; F/U was to be 2008( not completed)  . CVA (cerebral infarction) 2014  . Diabetes mellitus   . Diverticulosis 2003  . Dyspnea    with exertion  . Gout   . Hypertension   . Myocardial infarction ([place]) 2014  . OSA (obstructive sleep apnea) 03/27/2018  .  Pneumonia 09/29/11   Avelox X 10 days as OP  . Prostate cancer ([place])   . Renal insufficiency   . Seizures ([place])    not treated for seizure disorder; had a seizure after stroke 2014; no seizure since then  . Stroke [facility])    at same time he had MI  . Urinary frequency     Past Surgical History:  Procedure Laterality Date  . COLONOSCOPY W/ POLYPECTOMY  2003   no F/U ([place] discussed 12/03/12)  . INSERTION OF MESH N/A 07/16/2017   Procedure: INSERTION OF MESH;  Surgeon: [name], MD;  Location: [place];  Service: General;  Laterality: N/A;  . PEG PLACEMENT  2014  . PEG TUBE REMOVAL  2014  . PROSTATE BIOPSY    . TRACHEOSTOMY  2014   was closed after hospital stay  . VENTRAL HERNIA REPAIR N/A 07/16/2017   Procedure: LAPAROSCOPIC VENTRAL HERNIA REPAIR WITH MESH;  Surgeon: [name], MD;  Location: [place];  Service: General;  Laterality: N/A;  . wrist aspiration  02/16/12    monosodium urate crystals; Dr Caralyn Guile    Social History   Socioeconomic History  . Marital status: Married    Spouse name: [name]  . Number of children: 2  . Years of education: 75  . Highest education level: 12th grade  Occupational History  . Not on file  Social Needs  . Financial resource strain: Not on file  . Food insecurity    Worry: Not on file    Inability: Not on file  . Transportation needs    Medical: Not on file    Non-medical: Not on file  Tobacco Use  . Smoking status: Former Smoker    Packs/day: 0.25    Years: 1.00    Pack years: 0.25    Types: Cigarettes    Quit date: 05/15/1968    Years since quitting: 50.7  . Smokeless tobacco: Never Used  . Tobacco comment: smoked [place], up to 1-2 cigarettes/ day  Substance and Sexual Activity  . Alcohol use: No    Alcohol/week: 0.0 standard drinks  . Drug use: No  . Sexual activity: Yes  Lifestyle  . Physical activity    Days per week: Not on file    Minutes per session: Not on file  . Stress: Not on file  Relationships  .  Social Herbalist on phone: Not on file    Gets together: Not on file    Attends religious service: Not on file    Active member of club or organization: Not on file    Attends meetings of clubs or organizations: Not on file    Relationship status: Not on file  Other Topics Concern  . Not on file  Social History Narrative   Walks three times a week, 2-3 miles, occasional walks on treadmill       Patient is right-handed. He lives with his wife in a one level home. He drinks 1 diet Mt. Dew a day and rarely has coffee. He walks 3 x a week for exercise.    Family History  Problem Relation Age of Onset  . Hypertension Father   . Prostate cancer Father   . Diabetes Father   . Cancer Father   . Hypertension Mother   . Diabetes Mother   . Stroke Neg Hx   . Heart disease Neg Hx   . Colon cancer Neg Hx     Review of Systems  Constitutional: Negative for chills and fever.  Respiratory: Positive for shortness of breath (when he walks a lot). Negative for cough and wheezing.   Cardiovascular: Negative for chest pain, palpitations and leg swelling.  Neurological: Negative for dizziness and headaches.       Objective:  There were no vitals filed for this visit. BP Readings from Last 3 Encounters:  12/24/18 132/68  12/12/18 (!) 148/82  12/03/18 (!) 179/85   Wt Readings from Last 3 Encounters:  12/24/18 180 lb 9.6 oz (81.9 kg)  12/12/18 180 lb 9.6 oz (81.9 kg)  12/03/18 179 lb (81.2 kg)   There is no height or weight on file to calculate BMI.   Physical Exam    Constitutional: Appears well-developed and well-nourished. No distress.  HENT:  Head: Normocephalic and atraumatic.  Neck: Neck supple. No tracheal deviation present. No thyromegaly present.  No cervical lymphadenopathy Cardiovascular: Normal rate, regular rhythm and normal heart sounds.   No murmur heard. No carotid bruit .  No edema Pulmonary/Chest: Effort normal and breath sounds normal. No respiratory  distress. No has no wheezes. No rales.  Skin: Skin is warm and dry. Not diaphoretic.  Psychiatric: Normal mood and affect. Behavior is normal.      Assessment & Plan:    See Problem List for Assessment and Plan of chronic medical problems.

## 2019-01-23 ENCOUNTER — Encounter: Payer: Self-pay | Admitting: Internal Medicine

## 2019-01-23 ENCOUNTER — Ambulatory Visit (INDEPENDENT_AMBULATORY_CARE_PROVIDER_SITE_OTHER): Payer: Medicare HMO | Admitting: Internal Medicine

## 2019-01-23 DIAGNOSIS — N189 Chronic kidney disease, unspecified: Secondary | ICD-10-CM | POA: Diagnosis not present

## 2019-01-23 DIAGNOSIS — E1122 Type 2 diabetes mellitus with diabetic chronic kidney disease: Secondary | ICD-10-CM

## 2019-01-23 DIAGNOSIS — G4733 Obstructive sleep apnea (adult) (pediatric): Secondary | ICD-10-CM | POA: Diagnosis not present

## 2019-01-23 DIAGNOSIS — I509 Heart failure, unspecified: Secondary | ICD-10-CM | POA: Diagnosis not present

## 2019-01-23 DIAGNOSIS — I10 Essential (primary) hypertension: Secondary | ICD-10-CM | POA: Diagnosis not present

## 2019-01-23 DIAGNOSIS — Z794 Long term (current) use of insulin: Secondary | ICD-10-CM

## 2019-01-23 DIAGNOSIS — M1A9XX0 Chronic gout, unspecified, without tophus (tophi): Secondary | ICD-10-CM | POA: Diagnosis not present

## 2019-01-23 DIAGNOSIS — J188 Other pneumonia, unspecified organism: Secondary | ICD-10-CM | POA: Diagnosis not present

## 2019-01-23 NOTE — Assessment & Plan Note (Signed)
BP Readings from Last 3 Encounters:  12/24/18 132/68  12/12/18 (!) 148/82  12/03/18 (!) 179/85   Last blood pressure reading was controlled Continue current medication

## 2019-01-23 NOTE — Assessment & Plan Note (Signed)
Management per Dr. Dwyane Dee  Lab Results  Component Value Date   HGBA1C 6.6 (H) 12/20/2018   Sugars have been well controlled at home and last A1c was well controlled He is compliant with a diabetic diet Encourage more regular exercise

## 2019-01-23 NOTE — Progress Notes (Signed)
Virtual Visit via Video Note  I connected with Corey [name] on 01/23/19 at 11:00 AM EDT by a video enabled telemedicine application and verified that I am speaking with the correct person using two identifiers.   I discussed the limitations of evaluation and management by telemedicine and the availability of in person appointments. The patient expressed understanding and agreed to proceed.  The patient is currently at home and I am in the office.  His son and wife were with him during the visit.    No referring provider.    History of Present Illness: This visit is for follow-up of his chronic medical problems.  He is not exercising regularly.    CAD, Hypertension: He is taking his medication daily. He is compliant with a low sodium diet.  He does have some shortness of breath if he walks for long periods of time.  He denies chest pain, palpitations, edema  and regular headaches.      Diabetes: He sees Dr Dwyane Dee.  He is taking his medication daily as prescribed. He is compliant with a diabetic diet.   He does monitor his sugars at home and states that they have been well controlled.  CKD:  He does not take any nsaids.  He drinks water during the day.   Gout:  He takes colchicine as needed.   He was given I cannot find it on imaging so two flares a year.  He has no concerns.  His wife did want to know how the test came out but the neurologist had ordered.   Review of Systems  Constitutional: Negative for chills and fever.  Respiratory: Positive for shortness of breath (when he walks a lot). Negative for cough and wheezing.   Cardiovascular: Negative for chest pain, palpitations and leg swelling.  Neurological: Negative for dizziness and headaches.    Social History   Socioeconomic History  . Marital status: Married    Spouse name: [name]  . Number of children: 2  . Years of education: 36  . Highest education level: 12th grade  Occupational History  . Not on file  Social  Needs  . Financial resource strain: Not on file  . Food insecurity    Worry: Not on file    Inability: Not on file  . Transportation needs    Medical: Not on file    Non-medical: Not on file  Tobacco Use  . Smoking status: Former Smoker    Packs/day: 0.25    Years: 1.00    Pack years: 0.25    Types: Cigarettes    Quit date: 05/15/1968    Years since quitting: 50.7  . Smokeless tobacco: Never Used  . Tobacco comment: smoked [place], up to 1-2 cigarettes/ day  Substance and Sexual Activity  . Alcohol use: No    Alcohol/week: 0.0 standard drinks  . Drug use: No  . Sexual activity: Yes  Lifestyle  . Physical activity    Days per week: Not on file    Minutes per session: Not on file  . Stress: Not on file  Relationships  . Social Herbalist on phone: Not on file    Gets together: Not on file    Attends religious service: Not on file    Active member of club or organization: Not on file    Attends meetings of clubs or organizations: Not on file    Relationship status: Not on file  Other Topics Concern  . Not  on file  Social History Narrative   Walks three times a week, 2-3 miles, occasional walks on treadmill       Patient is right-handed. He lives with his wife in a one level home. He drinks 1 diet Mt. Dew a day and rarely has coffee. He walks 3 x a week for exercise.     Observations/Objective: Appears well in NAD   Assessment and Plan:  See Problem List for Assessment and Plan of chronic medical problems.   Follow Up Instructions:    I discussed the assessment and treatment plan with the patient. The patient was provided an opportunity to ask questions and all were answered. The patient agreed with the plan and demonstrated an understanding of the instructions.   The patient was advised to call back or seek an in-person evaluation if the symptoms worsen or if the condition fails to improve as anticipated.  Follow-up in 6 months  [name],  MD

## 2019-01-23 NOTE — Assessment & Plan Note (Signed)
Has approximately 2 flares of gout a year We will continue colchicine as needed Denies any recent symptoms

## 2019-01-23 NOTE — Assessment & Plan Note (Signed)
Does not take any NSAIDs and is drinking water Kidney function has been fairly stable

## 2019-02-22 DIAGNOSIS — G4733 Obstructive sleep apnea (adult) (pediatric): Secondary | ICD-10-CM | POA: Diagnosis not present

## 2019-02-22 DIAGNOSIS — J188 Other pneumonia, unspecified organism: Secondary | ICD-10-CM | POA: Diagnosis not present

## 2019-02-22 DIAGNOSIS — I509 Heart failure, unspecified: Secondary | ICD-10-CM | POA: Diagnosis not present

## 2019-02-28 ENCOUNTER — Ambulatory Visit: Payer: Self-pay | Admitting: *Deleted

## 2019-02-28 NOTE — Telephone Encounter (Signed)
Patient calling with complaints of experiencing SOB for a couple of weeks. Pt states that SOB occurs with walking a lot. Pt's wife states that she noticed the SOB a couple of weeks ago when the patient was talking and also noticed that his equilibrium was off. Pt states he experiences dizziness sometimes but none at the time of the call. Pt's wife states that the pt used a nebulizer 10 year ago when he had a heart attack/stroke which seemed to help him at that time. Pt denies fever, chest pain, cough or exposure to any one with COVID-19 at this time. Pt's wife advised to seek treatment in the ED/Urgent Care if symptoms become worse. Understanding verbalized. Call transferred to [place] in the office so that appt could be scheduled.  Reason for Disposition . MILD difficulty breathing (e.g., minimal/no SOB at rest, SOB with walking, pulse <100) . [1] MILD difficulty breathing (e.g., minimal/no SOB at rest, SOB with walking, pulse <100) AND [2] NEW-onset or WORSE than normal  Answer Assessment - Initial Assessment Questions 1. RESPIRATORY STATUS: "Describe your breathing?" (e.g., wheezing, shortness of breath, unable to speak, severe coughing)      Shortness of breath 2. ONSET: "When did this breathing problem begin?"      About a week ago 3. PATTERN "Does the difficult breathing come and go, or has it been constant since it started?"      Constant with walking 4. SEVERITY: "How bad is your breathing?" (e.g., mild, moderate, severe)    - MILD: No SOB at rest, mild SOB with walking, speaks normally in sentences, can lay down, no retractions, pulse < 100.    - MODERATE: SOB at rest, SOB with minimal exertion and prefers to sit, cannot lie down flat, speaks in phrases, mild retractions, audible wheezing, pulse 100-120.    - SEVERE: Very SOB at rest, speaks in single words, struggling to breathe, sitting hunched forward, retractions, pulse > 120      mild 5. RECURRENT SYMPTOM: "Have you had difficulty  breathing before?" If so, ask: "When was the last time?" and "What happened that time?"      No just started the last couple 6. CARDIAC HISTORY: "Do you have any history of heart disease?" (e.g., heart attack, angina, bypass surgery, angioplasty)      Heart attack about 5 or 6 years ago 7. LUNG HISTORY: "Do you have any history of lung disease?"  (e.g., pulmonary embolus, asthma, emphysema)     No 8. CAUSE: "What do you think is causing the breathing problem?"      unsure 9. OTHER SYMPTOMS: "Do you have any other symptoms? (e.g., dizziness, runny nose, cough, chest pain, fever)     Dizziness sometimes but not recently and cough 10. PREGNANCY: "Is there any chance you are pregnant?" "When was your last menstrual period?"       n/a 11. TRAVEL: "Have you traveled out of the country in the last month?" (e.g., travel history, exposures)       no  Answer Assessment - Initial Assessment Questions 1. COVID-19 DIAGNOSIS: "Who made your Coronavirus (COVID-19) diagnosis?" "Was it confirmed by a positive lab test?" If not diagnosed by a HCP, ask "Are there lots of cases (community spread) where you live?" (See public health department website, if unsure)     No none exposure  2. ONSET: "When did the COVID-19 symptoms start?"      SOB for the past couple of weeks 3. WORST SYMPTOM: "What is your worst  symptom?" (e.g., cough, fever, shortness of breath, muscle aches)     SOB 4. COUGH: "Do you have a cough?" If so, ask: "How bad is the cough?"       No 5. FEVER: "Do you have a fever?" If so, ask: "What is your temperature, how was it measured, and when did it start?"     No 6. RESPIRATORY STATUS: "Describe your breathing?" (e.g., shortness of breath, wheezing, unable to speak)      SOB with walking alot 7. BETTER-SAME-WORSE: "Are you getting better, staying the same or getting worse compared to yesterday?"  If getting worse, ask, "In what way?"     same 8. HIGH RISK DISEASE: "Do you have any chronic  medical problems?" (e.g., asthma, heart or lung disease, weak immune system, etc.)     History of heart attack and stroke 10 years ago 9. PREGNANCY: "Is there any chance you are pregnant?" "When was your last menstrual period?"     n/a 10. OTHER SYMPTOMS: "Do you have any other symptoms?"  (e.g., chills, fatigue, headache, loss of smell or taste, muscle pain, sore throat)       No  Protocols used: CORONAVIRUS (COVID-19) DIAGNOSED OR SUSPECTED-A-AH, BREATHING DIFFICULTY-A-AH

## 2019-03-01 ENCOUNTER — Other Ambulatory Visit: Payer: Self-pay

## 2019-03-01 ENCOUNTER — Ambulatory Visit (INDEPENDENT_AMBULATORY_CARE_PROVIDER_SITE_OTHER): Payer: Medicare HMO | Admitting: Family Medicine

## 2019-03-01 ENCOUNTER — Encounter: Payer: Self-pay | Admitting: Family Medicine

## 2019-03-01 DIAGNOSIS — R0602 Shortness of breath: Secondary | ICD-10-CM

## 2019-03-01 DIAGNOSIS — R0609 Other forms of dyspnea: Secondary | ICD-10-CM | POA: Insufficient documentation

## 2019-03-01 MED ORDER — ALBUTEROL SULFATE HFA 108 (90 BASE) MCG/ACT IN AERS: 2.0000 | INHALATION_SPRAY | RESPIRATORY_TRACT | 1 refills | Status: DC | PRN

## 2019-03-01 NOTE — Progress Notes (Signed)
Virtual Visit via Video Note  I connected with Corey [name] on 03/01/19 at  9:00 AM EDT by a video enabled telemedicine application and verified that I am speaking with the correct person using two identifiers.  Video function failed today on the part of the patient   Location: Patient: home Provider: office   Parties taking part in encounter: [name] Koonse-patient's wife [name] MD -treating physician    I discussed the limitations of evaluation and management by telemedicine and the availability of in person appointments. The patient expressed understanding and agreed to proceed.  History of Present Illness: 73 yo pt of [name] presents with sob on exertion  He thinks it may be asthma   Wife helps with history   Was out walking and came home a little short of breath  Tends to happen if he walks over 30 minutes  Feels like it happens about 1/2 way through  Hard to get air out when this happens   Feels a bit like the asthma he had as a kid  No wheezing  Does cough when this happens  Has hx of seasonal allergy problems  He sneezes after walking outdoors as well  He has had a neb machine many years ago -when he was congested  It helped a lot    Otherwise feels ok   No cold symptoms at all/does not feel sick   Not severe-he does not stop  He feels dizzy occ -not necessarily related-- wife thinks his equilibrium   Per wife-he had MI and stroke many years ago  Has DM that is well controlled  No hypoglycemia    It takes the sob about an hour to get better after coming inside   He gets his temp checked at work twice per week  No covid contacts  No fever/feels fine     Patient Active Problem List   Diagnosis Date Noted  . SOB (shortness of breath) on exertion 03/01/2019  . Mild cognitive impairment 12/05/2018  . Slurred speech 07/23/2018  . Muscle twitching 07/23/2018  . OSA (obstructive sleep apnea) 03/27/2018  . Snores 01/21/2018   . Poor balance 01/23/2017  . Ankle swelling, left 09/06/2016  . Bradycardia 01/28/2016  . Weak urinary stream 12/31/2015  . Chronic kidney disease 09/15/2015  . Ventral hernia without obstruction or gangrene 09/15/2015  . Prostate cancer ([place]) 07/25/2015  . Erectile dysfunction 05/04/2015  . Urinary frequency 05/04/2015  . 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 Abnormal finding on MRI of brain 02/14/2015  . PEA (Pulseless electrical activity) ([place]) 12/03/2012  . Diabetes ([place]) 06/30/2011  . Gout 03/05/2007  . Essential hypertension 03/05/2007   Past Medical History:  Diagnosis Date  . Cataract   . Colon polyp 2003   Dr [name]; F/U was to be 2008( not completed)  . CVA (cerebral infarction) 2014  . Diabetes mellitus   . Diverticulosis 2003  . Dyspnea    with exertion  . Gout   . Hypertension   . Myocardial infarction ([place]) 2014  . OSA (obstructive sleep apnea) 03/27/2018  . Pneumonia 09/29/11   Avelox X 10 days as OP  . Prostate cancer ([place])   . Renal insufficiency   . Seizures ([place])    not treated for seizure disorder; had a seizure after stroke 2014; no seizure since then  . Stroke [facility])    at same time he had MI  . Urinary frequency  Past Surgical History:  Procedure Laterality Date  . COLONOSCOPY W/ POLYPECTOMY  2003   no F/U ([place] discussed 12/03/12)  . INSERTION OF MESH N/A 07/16/2017   Procedure: INSERTION OF MESH;  Surgeon: [name], MD;  Location: [place];  Service: General;  Laterality: N/A;  . PEG PLACEMENT  2014  . PEG TUBE REMOVAL  2014  . PROSTATE BIOPSY    . TRACHEOSTOMY  2014   was closed after hospital stay  . VENTRAL HERNIA REPAIR N/A 07/16/2017   Procedure: LAPAROSCOPIC VENTRAL HERNIA REPAIR WITH MESH;  Surgeon: [name], MD;  Location: [place];  Service: General;  Laterality: N/A;  . wrist aspiration  02/16/12    monosodium urate crystals; Dr Caralyn Guile   Social History   Tobacco Use  . Smoking status:  Former Smoker    Packs/day: 0.25    Years: 1.00    Pack years: 0.25    Types: Cigarettes    Quit date: 05/15/1968    Years since quitting: 50.8  . Smokeless tobacco: Never Used  . Tobacco comment: smoked [place], up to 1-2 cigarettes/ day  Substance Use Topics  . Alcohol use: No    Alcohol/week: 0.0 standard drinks  . Drug use: No   Family History  Problem Relation Age of Onset  . Hypertension Father   . Prostate cancer Father   . Diabetes Father   . Cancer Father   . Hypertension Mother   . Diabetes Mother   . Stroke Neg Hx   . Heart disease Neg Hx   . Colon cancer Neg Hx    Allergies  Allergen Reactions  . Hydrochlorothiazide Other (See Comments)    Gout , uncontrolled diabetes and renal insufficiency  . Allopurinol Other (See Comments)    Renal insufficiency   Current Outpatient Medications on File Prior to Visit  Medication Sig Dispense Refill  . ACCU-CHEK AVIVA PLUS test strip CHECK BLOOD SUGAR ONE TIME DAILY 100 each 1  . amLODipine (NORVASC) 10 MG tablet Take 1 tablet (10 mg total) by mouth daily. 90 tablet 1  . aspirin EC 81 MG tablet Take 1 tablet (81 mg total) by mouth daily.    . Blood Glucose Monitoring Suppl (ACCU-CHEK AVIVA) device USE METER TO CHECK BLOOD SUGAR 2 TIMES DAILY. 1 each 0  . cloNIDine (CATAPRES) 0.3 MG tablet TAKE 1 TABLET TWICE DAILY 180 tablet 1  . colchicine (COLCRYS) 0.6 MG tablet Take 2 tabs once and then one tab one hour later as needed for gout 60 tablet 1  . glimepiride (AMARYL) 1 MG tablet Take 1 tablet daily before supper. 90 tablet 1  . hydrALAZINE (APRESOLINE) 50 MG tablet Take 1 tablet (50 mg total) by mouth 3 (three) times daily. 270 tablet 1  . isosorbide mononitrate (IMDUR) 60 MG 24 hr tablet TAKE 1 TABLET TWICE DAILY 180 tablet 1  . Lancet Devices (ACCU-CHEK SOFTCLIX) lancets Use to test blood sugar 2 times daily- Dx code E11.29 1 each 3  . losartan (COZAAR) 100 MG tablet TAKE 1 TABLET (100 MG TOTAL) BY MOUTH DAILY. 90 tablet  1  . metFORMIN (GLUCOPHAGE-XR) 750 MG 24 hr tablet Take 1 tablet (750 mg total) by mouth 2 (two) times daily. Take 1 tablet by mouth twice daily. 180 tablet 1  . metoprolol succinate (TOPROL-XL) 100 MG 24 hr tablet TAKE 1 TABLET TWICE DAILY 180 tablet 1  . oxybutynin (DITROPAN-XL) 10 MG 24 hr tablet Take 10 mg by mouth as needed.  No current facility-administered medications on file prior to visit.    Review of Systems  Constitutional: Negative for chills, fever and malaise/fatigue.  HENT: Negative for congestion, ear pain, sinus pain and sore throat.   Eyes: Negative for blurred vision, discharge and redness.  Respiratory: Positive for shortness of breath. Negative for cough, hemoptysis, sputum production and stridor.        Sob on exertion-chest feels tight (? Wheezing) like his ariways are reactive   Cardiovascular: Negative for chest pain, palpitations and leg swelling.  Gastrointestinal: Negative for abdominal pain, diarrhea, nausea and vomiting.  Musculoskeletal: Negative for myalgias.  Skin: Negative for rash.  Neurological: Negative for dizziness and headaches.       Poor equilibrium lately- not seemingly related to breathing symptoms     Observations/Objective: Patient sounds well Not distressed Not hoarse or slurring Not sob during speech No cough during interview  Wife helps with history  Nl affect and cognition    Assessment and Plan: Problem List Items Addressed This Visit      Other   SOB (shortness of breath) on exertion    Pt develops trouble getting air out after walking outdoors (with sneezing and occ cough)  Feels like this is asthma (he has had in the past) No other assoc symptoms  req albuterol (has used nmt years ago) Sent inhaler (mdi) to his pharmacy to use 2 puffs every 4-6 hours prn sob  inst pt and wife to watch closely for cp/ worse cough/fever or any other symptoms  inst to f/u with [name] next week           Follow Up Instructions: You  may be having some reactive airway problems (like asthma) when you are out in the cool weather and pollen   I sent an albuterol inhaler to the pharmacy for you to try  Use it as needed for shortness of breath -let us know if it does not help  It delivers the same medicine as the nebulizer maching you used many years ago   If you do not improve let us know If you worsen-let us know and go to the ER if needed Watch for chest discomfort/ nausea/sweating  Watch for cough/fever or body aches   Keep [place] posted  Check in with your primary care doctor next week    I discussed the assessment and treatment plan with the patient. The patient was provided an opportunity to ask questions and all were answered. The patient agreed with the plan and demonstrated an understanding of the instructions.   The patient was advised to call back or seek an in-person evaluation if the symptoms worsen or if the condition fails to improve as anticipated.  I provided 19 minutes of non-face-to-face time during this encounter.   [name], MD

## 2019-03-01 NOTE — Assessment & Plan Note (Signed)
Pt develops trouble getting air out after walking outdoors (with sneezing and occ cough)  Feels like this is asthma (he has had in the past) No other assoc symptoms  req albuterol (has used nmt years ago) Sent inhaler (mdi) to his pharmacy to use 2 puffs every 4-6 hours prn sob  inst pt and wife to watch closely for cp/ worse cough/fever or any other symptoms  inst to f/u with [name] next week

## 2019-03-01 NOTE — Patient Instructions (Addendum)
You may be having some reactive airway problems (like asthma) when you are out in the cool weather and pollen   I sent an albuterol inhaler to the pharmacy for you to try  Use it as needed for shortness of breath -let us know if it does not help  It delivers the same medicine as the nebulizer maching you used many years ago   If you do not improve let us know If you worsen-let us know and go to the ER if needed Watch for chest discomfort/ nausea/sweating  Watch for cough/fever or body aches   Keep [place] posted  Check in with your primary care doctor next week

## 2019-03-03 NOTE — Telephone Encounter (Signed)
Appointment made

## 2019-03-03 NOTE — Telephone Encounter (Signed)
Schedule in office

## 2019-03-03 NOTE — Telephone Encounter (Signed)
Looks like he was seen virtually on 10/17. FYI

## 2019-03-04 NOTE — Progress Notes (Signed)
Subjective:    Patient ID: Corey [name], male    DOB: Apr 07, 1946, 73 y.o.   MRN: [id number]  HPI The patient is here for an acute visit for SOB.  He had an e-visit on 10/17 for SOB.  He described the SOB as occurring when walking outdoors. It felt like his asthma when he was a child.  His chest would feel tight.  He had no other symptoms.  He was started on an albuterol inhaler.  He has been taking the albuterol when he gets home from his walk and he thinks it helps.  He states the shortness of breath will go away after about 30 minutes when he takes the albuterol.  If he does not take the albuterol he estimates the shortness of breath will last about an hour.  The shortness of breath started approximately 3 weeks ago.  He has it when he walks fast, which he does 3 times a week for exercise.  He is also noticed it with mowing the lawn.  He denies having any shortness of breath at rest or walking in his house.  He dizziness at times, but that is not always associated with the dyspnea on exertion.  He denies coughing, wheezing, fevers, chest pain, palpitations and leg swelling.    Echo:  05/2017:  EF 60-65%.  Severe concentric hypertrophy, no WMA, grade 3 DD     Medications and allergies reviewed with patient and updated if appropriate.  Patient Active Problem List   Diagnosis Date Noted  . SOB (shortness of breath) on exertion 03/01/2019  . Mild cognitive impairment 12/05/2018  . Slurred speech 07/23/2018  . Muscle twitching 07/23/2018  . OSA (obstructive sleep apnea) 03/27/2018  . Snores 01/21/2018  . Poor balance 01/23/2017  . Ankle swelling, left 09/06/2016  . Bradycardia 01/28/2016  . Weak urinary stream 12/31/2015  . Chronic kidney disease 09/15/2015  . Ventral hernia without obstruction or gangrene 09/15/2015  . Prostate cancer ([place]) 07/25/2015  . Erectile dysfunction 05/04/2015  . Urinary frequency 05/04/2015  . 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 Abnormal finding on MRI of brain 02/14/2015  . PEA (Pulseless electrical activity) ([place]) 12/03/2012  . Diabetes ([place]) 06/30/2011  . Gout 03/05/2007  . Essential hypertension 03/05/2007    Current Outpatient Medications on File Prior to Visit  Medication Sig Dispense Refill  . ACCU-CHEK AVIVA PLUS test strip CHECK BLOOD SUGAR ONE TIME DAILY 100 each 1  . albuterol (VENTOLIN HFA) 108 (90 Base) MCG/ACT inhaler Inhale 2 puffs into the lungs every 4 (four) hours as needed for wheezing or shortness of breath. 18 g 1  . amLODipine (NORVASC) 10 MG tablet Take 1 tablet (10 mg total) by mouth daily. 90 tablet 1  . aspirin EC 81 MG tablet Take 1 tablet (81 mg total) by mouth daily.    . Blood Glucose Monitoring Suppl (ACCU-CHEK AVIVA) device USE METER TO CHECK BLOOD SUGAR 2 TIMES DAILY. 1 each 0  . cloNIDine (CATAPRES) 0.3 MG tablet TAKE 1 TABLET TWICE DAILY 180 tablet 1  . colchicine (COLCRYS) 0.6 MG tablet Take 2 tabs once and then one tab one hour later as needed for gout 60 tablet 1  . glimepiride (AMARYL) 1 MG tablet Take 1 tablet daily before supper. 90 tablet 1  . hydrALAZINE (APRESOLINE) 50 MG tablet Take 1 tablet (50 mg total) by mouth 3 (three) times daily. 270 tablet 1  . isosorbide mononitrate (IMDUR) 60 MG 24 hr  tablet TAKE 1 TABLET TWICE DAILY 180 tablet 1  . Lancet Devices (ACCU-CHEK SOFTCLIX) lancets Use to test blood sugar 2 times daily- Dx code E11.29 1 each 3  . losartan (COZAAR) 100 MG tablet TAKE 1 TABLET (100 MG TOTAL) BY MOUTH DAILY. 90 tablet 1  . metFORMIN (GLUCOPHAGE-XR) 750 MG 24 hr tablet Take 1 tablet (750 mg total) by mouth 2 (two) times daily. Take 1 tablet by mouth twice daily. 180 tablet 1  . metoprolol succinate (TOPROL-XL) 100 MG 24 hr tablet TAKE 1 TABLET TWICE DAILY 180 tablet 1  . oxybutynin (DITROPAN-XL) 10 MG 24 hr tablet Take 10 mg by mouth as needed.     No current facility-administered medications on file prior to visit.     Past Medical History:   Diagnosis Date  . Cataract   . Colon polyp 2003   Dr [name]; F/U was to be 2008( not completed)  . CVA (cerebral infarction) 2014  . Diabetes mellitus   . Diverticulosis 2003  . Dyspnea    with exertion  . Gout   . Hypertension   . Myocardial infarction ([place]) 2014  . OSA (obstructive sleep apnea) 03/27/2018  . Pneumonia 09/29/11   Avelox X 10 days as OP  . Prostate cancer ([place])   . Renal insufficiency   . Seizures ([place])    not treated for seizure disorder; had a seizure after stroke 2014; no seizure since then  . Stroke [facility])    at same time he had MI  . Urinary frequency     Past Surgical History:  Procedure Laterality Date  . COLONOSCOPY W/ POLYPECTOMY  2003   no F/U ([place] discussed 12/03/12)  . INSERTION OF MESH N/A 07/16/2017   Procedure: INSERTION OF MESH;  Surgeon: [name], MD;  Location: [place];  Service: General;  Laterality: N/A;  . PEG PLACEMENT  2014  . PEG TUBE REMOVAL  2014  . PROSTATE BIOPSY    . TRACHEOSTOMY  2014   was closed after hospital stay  . VENTRAL HERNIA REPAIR N/A 07/16/2017   Procedure: LAPAROSCOPIC VENTRAL HERNIA REPAIR WITH MESH;  Surgeon: [name], MD;  Location: [place];  Service: General;  Laterality: N/A;  . wrist aspiration  02/16/12    monosodium urate crystals; Dr Caralyn Guile    Social History   Socioeconomic History  . Marital status: Married    Spouse name: [name]  . Number of children: 2  . Years of education: 32  . Highest education level: 12th grade  Occupational History  . Not on file  Social Needs  . Financial resource strain: Not on file  . Food insecurity    Worry: Not on file    Inability: Not on file  . Transportation needs    Medical: Not on file    Non-medical: Not on file  Tobacco Use  . Smoking status: Former Smoker    Packs/day: 0.25    Years: 1.00    Pack years: 0.25    Types: Cigarettes    Quit date: 05/15/1968    Years since quitting: 50.8  . Smokeless tobacco: Never Used  . Tobacco  comment: smoked [place], up to 1-2 cigarettes/ day  Substance and Sexual Activity  . Alcohol use: No    Alcohol/week: 0.0 standard drinks  . Drug use: No  . Sexual activity: Yes  Lifestyle  . Physical activity    Days per week: Not on file    Minutes per session:  Not on file  . Stress: Not on file  Relationships  . Social Herbalist on phone: Not on file    Gets together: Not on file    Attends religious service: Not on file    Active member of club or organization: Not on file    Attends meetings of clubs or organizations: Not on file    Relationship status: Not on file  Other Topics Concern  . Not on file  Social History Narrative   Walks three times a week, 2-3 miles, occasional walks on treadmill       Patient is right-handed. He lives with his wife in a one level home. He drinks 1 diet Mt. Dew a day and rarely has coffee. He walks 3 x a week for exercise.    Family History  Problem Relation Age of Onset  . Hypertension Father   . Prostate cancer Father   . Diabetes Father   . Cancer Father   . Hypertension Mother   . Diabetes Mother   . Stroke Neg Hx   . Heart disease Neg Hx   . Colon cancer Neg Hx     Review of Systems  Constitutional: Negative for chills and fever.  Respiratory: Positive for chest tightness (sometimes) and shortness of breath (with walking quickly). Negative for cough and wheezing.   Cardiovascular: Negative for chest pain, palpitations and leg swelling.  Neurological: Positive for dizziness (occ). Negative for headaches.       Objective:   Vitals:   03/05/19 1001  BP: 124/62  Pulse: 60  Resp: 18  Temp: 98.7 F (37.1 C)  SpO2: 98%   BP Readings from Last 3 Encounters:  03/05/19 124/62  03/01/19 (!) 159/80  12/24/18 132/68   Wt Readings from Last 3 Encounters:  03/05/19 186 lb (84.4 kg)  12/24/18 180 lb 9.6 oz (81.9 kg)  12/12/18 180 lb 9.6 oz (81.9 kg)   Body mass index is 30.02 kg/m.   Physical Exam     Constitutional: Appears well-developed and well-nourished. No distress.  HENT:  Head: Normocephalic and atraumatic.  Neck: Neck supple. No tracheal deviation present. No thyromegaly present.  No cervical lymphadenopathy Cardiovascular: Normal rate, regular rhythm and normal heart sounds.   No murmur heard. No carotid bruit .  1+ bilateral lower extremity pitting edema Pulmonary/Chest: Effort normal and breath sounds normal. No respiratory distress. No has no wheezes. No rales.  Skin: Skin is warm and dry. Not diaphoretic.  Psychiatric: Normal mood and affect. Behavior is normal.       Assessment & Plan:    See Problem List for Assessment and Plan of chronic medical problems.

## 2019-03-05 ENCOUNTER — Other Ambulatory Visit: Payer: Self-pay | Admitting: Internal Medicine

## 2019-03-05 ENCOUNTER — Other Ambulatory Visit (INDEPENDENT_AMBULATORY_CARE_PROVIDER_SITE_OTHER): Payer: Medicare HMO

## 2019-03-05 ENCOUNTER — Other Ambulatory Visit: Payer: Self-pay

## 2019-03-05 ENCOUNTER — Ambulatory Visit (INDEPENDENT_AMBULATORY_CARE_PROVIDER_SITE_OTHER)
Admission: RE | Admit: 2019-03-05 | Discharge: 2019-03-05 | Disposition: A | Discharge status: Home / Self Care | Payer: Medicare HMO | Source: Ambulatory Visit | Attending: Internal Medicine | Admitting: Internal Medicine

## 2019-03-05 ENCOUNTER — Encounter: Payer: Self-pay | Admitting: Internal Medicine

## 2019-03-05 ENCOUNTER — Ambulatory Visit (INDEPENDENT_AMBULATORY_CARE_PROVIDER_SITE_OTHER): Payer: Medicare HMO | Admitting: Internal Medicine

## 2019-03-05 VITALS — BP 124/62 | HR 60 | Temp 98.7°F | Resp 18 | Ht 66.0 in | Wt 186.0 lb

## 2019-03-05 DIAGNOSIS — R0609 Other forms of dyspnea: Secondary | ICD-10-CM | POA: Diagnosis not present

## 2019-03-05 DIAGNOSIS — R0600 Dyspnea, unspecified: Secondary | ICD-10-CM

## 2019-03-05 DIAGNOSIS — Z23 Encounter for immunization: Secondary | ICD-10-CM | POA: Diagnosis not present

## 2019-03-05 LAB — COMPREHENSIVE METABOLIC PANEL
ALT: 26 U/L (ref 0–53)
AST: 29 U/L (ref 0–37)
Albumin: 4 g/dL (ref 3.5–5.2)
Alkaline Phosphatase: 91 U/L (ref 39–117)
BUN: 22 mg/dL (ref 6–23)
CO2: 23 mEq/L (ref 19–32)
Calcium: 10.1 mg/dL (ref 8.4–10.5)
Chloride: 111 mEq/L (ref 96–112)
Creatinine, Ser: 1.31 mg/dL (ref 0.40–1.50)
GFR: 65.29 mL/min (ref >60.00)
Glucose, Bld: 173 mg/dL — ABNORMAL HIGH (ref 70–99)
Potassium: 4.1 mEq/L (ref 3.5–5.1)
Sodium: 141 mEq/L (ref 135–145)
Total Bilirubin: 0.7 mg/dL (ref 0.2–1.2)
Total Protein: 6.6 g/dL (ref 6.0–8.3)

## 2019-03-05 LAB — CBC WITH DIFFERENTIAL/PLATELET
Basophils Absolute: 0 10*3/uL (ref 0.0–0.1)
Basophils Relative: 0.6 % (ref 0.0–3.0)
Eosinophils Absolute: 0.2 10*3/uL (ref 0.0–0.7)
Eosinophils Relative: 4.1 % (ref 0.0–5.0)
HCT: 35.4 % — ABNORMAL LOW (ref 39.0–52.0)
Hemoglobin: 11.5 g/dL — ABNORMAL LOW (ref 13.0–17.0)
Lymphocytes Relative: 16.8 % (ref 12.0–46.0)
Lymphs Abs: 0.9 10*3/uL (ref 0.7–4.0)
MCHC: 32.6 g/dL (ref 30.0–36.0)
MCV: 82.7 fl (ref 78.0–100.0)
Monocytes Absolute: 0.5 10*3/uL (ref 0.1–1.0)
Monocytes Relative: 9 % (ref 3.0–12.0)
Neutro Abs: 3.7 10*3/uL (ref 1.4–7.7)
Neutrophils Relative %: 69.5 % (ref 43.0–77.0)
Platelets: 211 10*3/uL (ref 150.0–400.0)
RBC: 4.27 Mil/uL (ref 4.22–5.81)
RDW: 14.8 % (ref 11.5–15.5)
WBC: 5.3 10*3/uL (ref 4.0–10.5)

## 2019-03-05 LAB — BRAIN NATRIURETIC PEPTIDE: Pro B Natriuretic peptide (BNP): 1543 pg/mL — ABNORMAL HIGH (ref 0.0–100.0)

## 2019-03-05 MED ORDER — FUROSEMIDE 20 MG PO TABS: ORAL_TABLET | ORAL | 3 refills | Status: DC

## 2019-03-05 NOTE — Patient Instructions (Addendum)
Have a chest xray and blood work today.    Flu immunization administered today.   An EKG was done today.   Medications reviewed and updated.  Changes include :   none    A referral was ordered for cardiology

## 2019-03-05 NOTE — Assessment & Plan Note (Signed)
Experiencing dyspnea on exertion for about 3 weeks Started on an albuterol inhaler for possible asthma, which she had is a kid and he thinks that may be helping.  He is taking this after he finishes exercise.  Advised for him to start taking it 30 minutes before he goes walking Need to rule out cardiac cause as well EKG today shows sinus bradycardia at 51 bpm, first-degree AV block, nonspecific T wave change, no change compared to previous EKG from 2019 CBC, CMP, BNP-he does have 1+ bilateral lower extremity edema Chest x-ray Referral made for cardiology

## 2019-03-25 ENCOUNTER — Other Ambulatory Visit: Payer: Medicare HMO

## 2019-03-25 DIAGNOSIS — G4733 Obstructive sleep apnea (adult) (pediatric): Secondary | ICD-10-CM | POA: Diagnosis not present

## 2019-03-25 DIAGNOSIS — J188 Other pneumonia, unspecified organism: Secondary | ICD-10-CM | POA: Diagnosis not present

## 2019-03-25 DIAGNOSIS — I509 Heart failure, unspecified: Secondary | ICD-10-CM | POA: Diagnosis not present

## 2019-03-28 ENCOUNTER — Ambulatory Visit: Payer: Medicare HMO | Admitting: Endocrinology

## 2019-04-07 ENCOUNTER — Encounter: Payer: Self-pay | Admitting: Internal Medicine

## 2019-04-15 ENCOUNTER — Ambulatory Visit: Payer: Medicare HMO | Admitting: Cardiology

## 2019-04-15 ENCOUNTER — Other Ambulatory Visit: Payer: Self-pay

## 2019-04-15 ENCOUNTER — Encounter: Payer: Self-pay | Admitting: Cardiology

## 2019-04-15 VITALS — BP 124/70 | HR 46 | Ht 66.0 in | Wt 182.8 lb

## 2019-04-15 DIAGNOSIS — I119 Hypertensive heart disease without heart failure: Secondary | ICD-10-CM

## 2019-04-15 DIAGNOSIS — R001 Bradycardia, unspecified: Secondary | ICD-10-CM

## 2019-04-15 DIAGNOSIS — I440 Atrioventricular block, first degree: Secondary | ICD-10-CM

## 2019-04-15 MED ORDER — METOPROLOL SUCCINATE ER 100 MG PO TB24: 100.0000 mg | ORAL_TABLET | Freq: Every day | ORAL | 3 refills | Status: DC

## 2019-04-15 NOTE — Patient Instructions (Signed)
Medication Instructions:  Please decrease your Metoprolol succinate to 100 mg once a day. Continue all other medications as listed.  *If you need a refill on your cardiac medications before your next appointment, please call your pharmacy*  Follow-Up: At [facility], you and your health needs are our priority.  As part of our continuing mission to provide you with exceptional heart care, we have created designated Provider Care Teams.  These Care Teams include your primary Cardiologist (physician) and Advanced Practice Providers (APPs -  Physician Assistants and Nurse Practitioners) who all work together to provide you with the care you need, when you need it.  Your next appointment:   2 month(s)  The format for your next appointment:   In Person  Provider:   You may see Candee Furbish, MD or one of the following Advanced Practice Providers on your designated Care Team:    Truitt Merle, NP  Cecilie Kicks, NP  Kathyrn Drown, NP   Thank you for choosing [facility]!!

## 2019-04-15 NOTE — Progress Notes (Signed)
Cardiology Office Note:    Date:  04/15/2019   ID:  [name], DOB 11-Dec-1945, MRN [id number]  PCP:  [name], MD  Cardiologist:  Candee Furbish, MD   Referring MD: [name], MD     History of Present Illness:    Corey [name] is a 73 y.o. male with severe left ventricular hypertrophy, normal EF, prior 1 month hospital stay in 2014 after suffering a PEA arrest with infection, hypertensive urgency here for preoperative risk stratification prior to hernia repair.  He saw Dr. Johnsie Cancel last in 2014  Overall he has not been experiencing any anginal symptoms, no shortness of breath, he is able to traverse 1-2 flights of stairs without difficulty.  Obviously 2014 was a extreme experience for him.  He had not had any cardiology follow-up from 20 14-20 20   Hypertrophic changes noted on his prior ECG as well as echocardiogram.  04/15/2019-here for follow-up prior PEA arrest with LVH. Mild SOB with activity, does not think that the albuterol inhaler has helped.  Seems to have been a longstanding issue for him.  He continues to walk 3 days a week however.  Excellent.  No syncope.  His heart rate is quite slow, mid to upper 40s.  Last EKG in October showed heart rate of 51 bpm with first-degree AV block.  We are going to pull back on his Toprol.  Past Medical History:  Diagnosis Date  . Cataract   . Colon polyp 2003   Dr [name]; F/U was to be 2008( not completed)  . CVA (cerebral infarction) 2014  . Diabetes mellitus   . Diverticulosis 2003  . Dyspnea    with exertion  . Gout   . Hypertension   . Myocardial infarction ([place]) 2014  . OSA (obstructive sleep apnea) 03/27/2018  . Pneumonia 09/29/11   Avelox X 10 days as OP  . Prostate cancer ([place])   . Renal insufficiency   . Seizures ([place])    not treated for seizure disorder; had a seizure after stroke 2014; no seizure since then  . Stroke [facility])    at same time he had MI  . Urinary frequency     Past Surgical History:   Procedure Laterality Date  . COLONOSCOPY W/ POLYPECTOMY  2003   no F/U ([place] discussed 12/03/12)  . INSERTION OF MESH N/A 07/16/2017   Procedure: INSERTION OF MESH;  Surgeon: [name], MD;  Location: [place];  Service: General;  Laterality: N/A;  . PEG PLACEMENT  2014  . PEG TUBE REMOVAL  2014  . PROSTATE BIOPSY    . TRACHEOSTOMY  2014   was closed after hospital stay  . VENTRAL HERNIA REPAIR N/A 07/16/2017   Procedure: LAPAROSCOPIC VENTRAL HERNIA REPAIR WITH MESH;  Surgeon: [name], MD;  Location: [place];  Service: General;  Laterality: N/A;  . wrist aspiration  02/16/12    monosodium urate crystals; Dr Caralyn Guile    Current Medications: Current Meds  Medication Sig  . ACCU-CHEK AVIVA PLUS test strip CHECK BLOOD SUGAR ONE TIME DAILY  . albuterol (VENTOLIN HFA) 108 (90 Base) MCG/ACT inhaler Inhale 2 puffs into the lungs every 4 (four) hours as needed for wheezing or shortness of breath.  Marland Kitchen amLODipine (NORVASC) 10 MG tablet Take 1 tablet (10 mg total) by mouth daily.  Marland Kitchen aspirin EC 81 MG tablet Take 1 tablet (81 mg total) by mouth daily.  . Blood Glucose Monitoring Suppl (ACCU-CHEK AVIVA) device  USE METER TO CHECK BLOOD SUGAR 2 TIMES DAILY.  . cloNIDine (CATAPRES) 0.3 MG tablet TAKE 1 TABLET TWICE DAILY  . colchicine (COLCRYS) 0.6 MG tablet Take 2 tabs once and then one tab one hour later as needed for gout  . furosemide (LASIX) 20 MG tablet Take 2 pills x 1 day, then 1 pill daily in the morning (Patient taking differently: Take 20 mg by mouth daily. )  . glimepiride (AMARYL) 1 MG tablet Take 1 tablet daily before supper.  . hydrALAZINE (APRESOLINE) 50 MG tablet Take 1 tablet (50 mg total) by mouth 3 (three) times daily.  . isosorbide mononitrate (IMDUR) 60 MG 24 hr tablet TAKE 1 TABLET TWICE DAILY  . Lancet Devices (ACCU-CHEK SOFTCLIX) lancets Use to test blood sugar 2 times daily- Dx code E11.29  . losartan (COZAAR) 100 MG tablet TAKE 1 TABLET (100 MG TOTAL) BY MOUTH DAILY.  .  metFORMIN (GLUCOPHAGE-XR) 750 MG 24 hr tablet Take 1 tablet (750 mg total) by mouth 2 (two) times daily. Take 1 tablet by mouth twice daily.  Marland Kitchen oxybutynin (DITROPAN-XL) 10 MG 24 hr tablet Take 10 mg by mouth as needed.  . [DISCONTINUED] metoprolol succinate (TOPROL-XL) 100 MG 24 hr tablet TAKE 1 TABLET TWICE DAILY     Allergies:   Hydrochlorothiazide and Allopurinol   Social History   Socioeconomic History  . Marital status: Married    Spouse name: [name]  . Number of children: 2  . Years of education: 41  . Highest education level: 12th grade  Occupational History  . Not on file  Social Needs  . Financial resource strain: Not on file  . Food insecurity    Worry: Not on file    Inability: Not on file  . Transportation needs    Medical: Not on file    Non-medical: Not on file  Tobacco Use  . Smoking status: Former Smoker    Packs/day: 0.25    Years: 1.00    Pack years: 0.25    Types: Cigarettes    Quit date: 05/15/1968    Years since quitting: 50.9  . Smokeless tobacco: Never Used  . Tobacco comment: smoked [place], up to 1-2 cigarettes/ day  Substance and Sexual Activity  . Alcohol use: No    Alcohol/week: 0.0 standard drinks  . Drug use: No  . Sexual activity: Yes  Lifestyle  . Physical activity    Days per week: Not on file    Minutes per session: Not on file  . Stress: Not on file  Relationships  . Social Herbalist on phone: Not on file    Gets together: Not on file    Attends religious service: Not on file    Active member of club or organization: Not on file    Attends meetings of clubs or organizations: Not on file    Relationship status: Not on file  Other Topics Concern  . Not on file  Social History Narrative   Walks three times a week, 2-3 miles, occasional walks on treadmill       Patient is right-handed. He lives with his wife in a one level home. He drinks 1 diet Mt. Dew a day and rarely has coffee. He walks 3 x a week for  exercise.     Family History: The patient's family history includes Cancer in his father; Diabetes in his father and mother; Hypertension in his father and mother; Prostate cancer in his father. There is  no history of Stroke, Heart disease, or Colon cancer.  ROS:   Please see the history of present illness.     All other systems reviewed and are negative.  EKGs/Labs/Other Studies Reviewed:    The following studies were reviewed today: Prior hospital records, EKG, echocardiogram reviewed  Echo 09/19/12-severe LVH, normal EF.  ECHO 05/30/17 - Echocardiogram continues to demonstrate severe left ventricular hypertrophy, normal ejection fraction. No significant changes when compared to prior study a proximally 5 years ago. In fact, pericardial effusion remains. No evidence of tamponade.   EKG:  EKG is  ordered today.  Today's ECG shows sinus bradycardia, first-degree AV block with PACs.  Left axis deviation noted as well as poor R wave progression and nonspecific ST-T wave changes.  Personally viewed  Recent Labs: 03/05/2019: ALT 26; BUN 22; Creatinine, Ser 1.31; Hemoglobin 11.5; Platelets 211.0; Potassium 4.1; Pro B Natriuretic peptide (BNP) 1,543.0; Sodium 141  Recent Lipid Panel    Component Value Date/Time   CHOL 112 07/23/2018 1139   TRIG 58.0 07/23/2018 1139   HDL 48.00 07/23/2018 1139   CHOLHDL 2 07/23/2018 1139   VLDL 11.6 07/23/2018 1139   LDLCALC 53 07/23/2018 1139    Physical Exam:    VS:  BP 124/70   Pulse (!) 46   Ht 5\' 6"  (1.676 m)   Wt 182 lb 12.8 oz (82.9 kg)   SpO2 93%   BMI 29.50 kg/m     Wt Readings from Last 3 Encounters:  04/15/19 182 lb 12.8 oz (82.9 kg)  03/05/19 186 lb (84.4 kg)  12/24/18 180 lb 9.6 oz (81.9 kg)     GEN: Well nourished, well developed, in no acute distress  HEENT: normal  Neck: no JVD, carotid bruits, or masses Cardiac: brady reg; no murmurs, rubs, or gallops,no edema  Respiratory:  clear to auscultation bilaterally, normal  work of breathing GI: soft, nontender, nondistended, + BS MS: no deformity or atrophy  Skin: warm and dry, no rash Neuro:  Alert and Oriented x 3, Strength and sensation are intact Psych: euthymic mood, full affect   ASSESSMENT:    1. Sinus bradycardia   2. First degree AV block   3. Benign hypertensive heart disease without heart failure    PLAN:    In order of problems listed above:  Sinus bradycardia with first-degree AV block -Heart rate in the 40s today.  Last EKG in October 51.  Lets go ahead and cut his metoprolol in half to 100 mg once a day.  We will see him back in 2 months, if we must, we will cut it back in half again.  This may be contributing somewhat to his shortness of breath with activity.  Hypertensive heart disease without heart failure -Continue with aggressive blood pressure control.  Excellently controlled today.  No dizziness.  Multidrug regimen reviewed.  58-month follow-up  Medication Adjustments/Labs and Tests Ordered: Current medicines are reviewed at length with the patient today.  Concerns regarding medicines are outlined above.  No orders of the defined types were placed in this encounter.  Meds ordered this encounter  Medications  . DISCONTD: metoprolol succinate (TOPROL-XL) 100 MG 24 hr tablet    Sig: Take 1 tablet (100 mg total) by mouth daily. Take with or immediately following a meal.    Dispense:  90 tablet    Refill:  3  . metoprolol succinate (TOPROL-XL) 100 MG 24 hr tablet    Sig: Take 1 tablet (100 mg total) by mouth  daily. Take with or immediately following a meal.    Dispense:  90 tablet    Refill:  3    Signed, Candee Furbish, MD  04/15/2019 12:00 PM    [facility]  Addendum 06/20/17:  Echocardiogram continues to demonstrate severe left ventricular hypertrophy, normal ejection fraction. No significant changes when compared to prior study approximally 5 years ago. In fact, pericardial effusion remains. No  evidence of tamponade. He may proceed with surgery. Continue to adequately treat hypertension.  Candee Furbish, MD

## 2019-04-24 DIAGNOSIS — J188 Other pneumonia, unspecified organism: Secondary | ICD-10-CM | POA: Diagnosis not present

## 2019-04-24 DIAGNOSIS — I509 Heart failure, unspecified: Secondary | ICD-10-CM | POA: Diagnosis not present

## 2019-04-24 DIAGNOSIS — G4733 Obstructive sleep apnea (adult) (pediatric): Secondary | ICD-10-CM | POA: Diagnosis not present

## 2019-04-30 ENCOUNTER — Other Ambulatory Visit (INDEPENDENT_AMBULATORY_CARE_PROVIDER_SITE_OTHER): Payer: Medicare HMO

## 2019-04-30 DIAGNOSIS — E1165 Type 2 diabetes mellitus with hyperglycemia: Secondary | ICD-10-CM

## 2019-04-30 LAB — LIPID PANEL
Cholesterol: 125 mg/dL (ref 0–200)
HDL: 46.5 mg/dL (ref >39.00)
LDL Cholesterol: 67 mg/dL (ref 0–99)
NonHDL: 78.99
Total CHOL/HDL Ratio: 3
Triglycerides: 60 mg/dL (ref 0.0–149.0)
VLDL: 12 mg/dL (ref 0.0–40.0)

## 2019-04-30 LAB — COMPREHENSIVE METABOLIC PANEL
ALT: 25 U/L (ref 0–53)
AST: 26 U/L (ref 0–37)
Albumin: 3.8 g/dL (ref 3.5–5.2)
Alkaline Phosphatase: 67 U/L (ref 39–117)
BUN: 26 mg/dL — ABNORMAL HIGH (ref 6–23)
CO2: 28 mEq/L (ref 19–32)
Calcium: 10.2 mg/dL (ref 8.4–10.5)
Chloride: 109 mEq/L (ref 96–112)
Creatinine, Ser: 1.43 mg/dL (ref 0.40–1.50)
GFR: 58.98 mL/min — ABNORMAL LOW (ref >60.00)
Glucose, Bld: 141 mg/dL — ABNORMAL HIGH (ref 70–99)
Potassium: 4.3 mEq/L (ref 3.5–5.1)
Sodium: 141 mEq/L (ref 135–145)
Total Bilirubin: 0.8 mg/dL (ref 0.2–1.2)
Total Protein: 6.4 g/dL (ref 6.0–8.3)

## 2019-04-30 LAB — HEMOGLOBIN A1C: Hgb A1c MFr Bld: 7.5 % — ABNORMAL HIGH (ref 4.6–6.5)

## 2019-05-01 ENCOUNTER — Other Ambulatory Visit: Payer: Self-pay

## 2019-05-01 MED ORDER — ACCU-CHEK AVIVA PLUS VI STRP: ORAL_STRIP | 1 refills | Status: DC

## 2019-05-07 ENCOUNTER — Ambulatory Visit (INDEPENDENT_AMBULATORY_CARE_PROVIDER_SITE_OTHER): Payer: Medicare HMO | Admitting: Endocrinology

## 2019-05-07 ENCOUNTER — Other Ambulatory Visit: Payer: Self-pay

## 2019-05-07 ENCOUNTER — Encounter: Payer: Self-pay | Admitting: Endocrinology

## 2019-05-07 VITALS — BP 134/82 | HR 51 | Ht 66.0 in | Wt 185.8 lb

## 2019-05-07 DIAGNOSIS — N189 Chronic kidney disease, unspecified: Secondary | ICD-10-CM

## 2019-05-07 DIAGNOSIS — E1165 Type 2 diabetes mellitus with hyperglycemia: Secondary | ICD-10-CM

## 2019-05-07 MED ORDER — ACCU-CHEK FASTCLIX LANCETS MISC: 2 refills | Status: DC

## 2019-05-07 MED ORDER — GLIMEPIRIDE 1 MG PO TABS: ORAL_TABLET | ORAL | 1 refills | Status: DC

## 2019-05-07 MED ORDER — ACCU-CHEK GUIDE VI STRP: ORAL_STRIP | 2 refills | Status: DC

## 2019-05-07 NOTE — Progress Notes (Signed)
Patient ID: Corey [name], male   DOB: 1946-04-02, 73 y.o.   MRN: [id number]   Reason for Appointment : Followup of Type 2 Diabetes   History of Present Illness          Diagnosis: Type 2 diabetes mellitus, date of diagnosis: 1997      Past history: The patient was initially treated with various oral hypoglycemic drugs and details are not available. About 2009 he was started on insulin and has either taken premixed insulin or Lantus once a day He had difficulty affording his Lantus insulin and had preferred to take premixed insulin Subsequently insulin was tapered off  Recent history:    Oral hypoglycemic drugs: Metformin ER 750 mg twice daily   Current blood sugar patterns and problems:  His A1c is dropped to 7.5 compared to 6.6   He brought his meter today but could not get any data from it because of malfunction  Also not clear whether his test strips are out of date and how often he checks his blood sugars  Because of low normal or low sugars in August when he last came he was told to stop his low-dose Amaryl  He thinks he may be getting relatively high readings at times at home and around 150-160 fasting and reportedly better in the evenings  Not checking readings after meals and mostly in the mornings reportedly  He does try to walk regularly as before  However previously had lost a lot of weight and this is coming up slightly  No side effects from Metformin and renal function has been borderline normal previously  Usually avoiding drinks with sugar    Glucose monitoring:  done less than 1 time a day         Glucometer:  Accu-Chek      Blood Glucose readings recall as above  Previous readings:  PRE-MEAL Fasting Lunch Dinner Bedtime Overall  Glucose range:  70  108     Mean/median:      82   POST-MEAL PC Breakfast PC Lunch PC Dinner  Glucose range:  86, 87   57-103  Mean/median:    79    Self-care:       Meals: 2-3 meals per day. Egg in am at  9-10 am or grits, rarely sandwich at lunch. His largest meal is at suppertime at about 8 pm.  Avoiding regular soft drinks  Physical activity: exercise: Walking 30 minutes; 4/7  days        Dietician visit: Most recent: 02/2014               Compliance with the medical regimen:  Fair.    Wt Readings from Last 3 Encounters:  05/07/19 185 lb 12.8 oz (84.3 kg)  04/15/19 182 lb 12.8 oz (82.9 kg)  03/05/19 186 lb (84.4 kg)    Lab Results  Component Value Date   HGBA1C 7.5 (H) 04/30/2019   HGBA1C 6.6 (H) 12/20/2018   HGBA1C 6.8 (H) 07/23/2018    Lab Results  Component Value Date   MICROALBUR 2.5 (H) 12/20/2018   LDLCALC 67 04/30/2019   CREATININE 1.43 04/30/2019   No visits with results within 1 Week(s) from this visit.  Latest known visit with results is:  Lab on 04/30/2019  Component Date Value Ref Range Status  . Cholesterol 04/30/2019 125  0 - 200 mg/dL Final   ATP III Classification       Desirable:  < 200 mg/dL  Borderline High:  200 - 239 mg/dL          High:  > = 240 mg/dL  . Triglycerides 04/30/2019 60.0  0.0 - 149.0 mg/dL Final   Normal:  <150 mg/dLBorderline High:  150 - 199 mg/dL  . HDL 04/30/2019 46.50  >39.00 mg/dL Final  . VLDL 04/30/2019 12.0  0.0 - 40.0 mg/dL Final  . LDL Cholesterol 04/30/2019 67  0 - 99 mg/dL Final  . Total CHOL/HDL Ratio 04/30/2019 3   Final                  Men          Women1/2 Average Risk     3.4          3.3Average Risk          5.0          4.42X Average Risk          9.6          7.13X Average Risk          15.0          11.0                      . NonHDL 04/30/2019 78.99   Final   NOTE:  Non-HDL goal should be 30 mg/dL higher than patient's LDL goal (i.e. LDL goal of < 70 mg/dL, would have non-HDL goal of < 100 mg/dL)  . Sodium 04/30/2019 141  135 - 145 mEq/L Final  . Potassium 04/30/2019 4.3  3.5 - 5.1 mEq/L Final  . Chloride 04/30/2019 109  96 - 112 mEq/L Final  . CO2 04/30/2019 28  19 - 32 mEq/L Final  . Glucose,  Bld 04/30/2019 141* 70 - 99 mg/dL Final  . BUN 04/30/2019 26* 6 - 23 mg/dL Final  . Creatinine, Ser 04/30/2019 1.43  0.40 - 1.50 mg/dL Final  . Total Bilirubin 04/30/2019 0.8  0.2 - 1.2 mg/dL Final  . Alkaline Phosphatase 04/30/2019 67  39 - 117 U/L Final  . AST 04/30/2019 26  0 - 37 U/L Final  . ALT 04/30/2019 25  0 - 53 U/L Final  . Total Protein 04/30/2019 6.4  6.0 - 8.3 g/dL Final  . Albumin 04/30/2019 3.8  3.5 - 5.2 g/dL Final  . GFR 04/30/2019 58.98* >60.00 mL/min Final  . Calcium 04/30/2019 10.2  8.4 - 10.5 mg/dL Final  . Hgb A1c MFr Bld 04/30/2019 7.5* 4.6 - 6.5 % Final   Glycemic Control Guidelines for People with Diabetes:Non Diabetic:  <6%Goal of Therapy: <7%Additional Action Suggested:  >8%      Allergies as of 05/07/2019      Reactions   Hydrochlorothiazide Other (See Comments)   Gout , uncontrolled diabetes and renal insufficiency   Allopurinol Other (See Comments)   Renal insufficiency      Medication List       Accurate as of May 07, 2019  4:51 PM. If you have any questions, ask your nurse or doctor.        STOP taking these medications   accu-chek softclix lancets Stopped by: Jayme Cloud, LPN     TAKE these medications   Accu-Chek FastClix Lancets Misc Use Accu Chek fastclix lancets to check blood sugar twice daily. DX:E11.65 What changed:   how much to take  how to take this  when to take this  additional instructions Changed by: Jayme Cloud, LPN  Accu-Chek Guide Me w/Device Kit 1 each by Does not apply route 2 (two) times daily. Use Accu Chek Guide Me device to check blood sugar twice daily. What changed: Another medication with the same name was removed. Continue taking this medication, and follow the directions you see here. Changed by: Jayme Cloud, LPN   Accu-Chek Guide test strip Generic drug: glucose blood Use Accu Chek guide test strips as instructed to check blood sugar twice daily. DX:E11.65 What changed:   how much to  take  how to take this  when to take this  additional instructions  Another medication with the same name was removed. Continue taking this medication, and follow the directions you see here. Changed by: Jayme Cloud, LPN   albuterol 517 (90 Base) MCG/ACT inhaler Commonly known as: VENTOLIN HFA Inhale 2 puffs into the lungs every 4 (four) hours as needed for wheezing or shortness of breath.   amLODipine 10 MG tablet Commonly known as: NORVASC Take 1 tablet (10 mg total) by mouth daily.   aspirin EC 81 MG tablet Take 1 tablet (81 mg total) by mouth daily.   cloNIDine 0.3 MG tablet Commonly known as: CATAPRES TAKE 1 TABLET TWICE DAILY   colchicine 0.6 MG tablet Commonly known as: Colcrys Take 2 tabs once and then one tab one hour later as needed for gout   furosemide 20 MG tablet Commonly known as: LASIX Take 2 pills x 1 day, then 1 pill daily in the morning What changed:   how much to take  how to take this  when to take this  additional instructions   glimepiride 1 MG tablet Commonly known as: AMARYL Take 1/2 tab at bedtime What changed: additional instructions Changed by: Elayne Snare, MD   hydrALAZINE 50 MG tablet Commonly known as: APRESOLINE Take 1 tablet (50 mg total) by mouth 3 (three) times daily.   isosorbide mononitrate 60 MG 24 hr tablet Commonly known as: IMDUR TAKE 1 TABLET TWICE DAILY   losartan 100 MG tablet Commonly known as: COZAAR TAKE 1 TABLET (100 MG TOTAL) BY MOUTH DAILY.   metFORMIN 750 MG 24 hr tablet Commonly known as: GLUCOPHAGE-XR Take 1 tablet (750 mg total) by mouth 2 (two) times daily. Take 1 tablet by mouth twice daily.   metoprolol succinate 100 MG 24 hr tablet Commonly known as: TOPROL-XL Take 1 tablet (100 mg total) by mouth daily. Take with or immediately following a meal.   oxybutynin 10 MG 24 hr tablet Commonly known as: DITROPAN-XL Take 10 mg by mouth as needed.       Allergies:  Allergies  Allergen  Reactions  . Hydrochlorothiazide Other (See Comments)    Gout , uncontrolled diabetes and renal insufficiency  . Allopurinol Other (See Comments)    Renal insufficiency    Past Medical History:  Diagnosis Date  . Cataract   . Colon polyp 2003   Dr [name]; F/U was to be 2008( not completed)  . CVA (cerebral infarction) 2014  . Diabetes mellitus   . Diverticulosis 2003  . Dyspnea    with exertion  . Gout   . Hypertension   . Myocardial infarction ([place]) 2014  . OSA (obstructive sleep apnea) 03/27/2018  . Pneumonia 09/29/11   Avelox X 10 days as OP  . Prostate cancer ([name])   . Renal insufficiency   . Seizures ([place])    not treated for seizure disorder; had a seizure after stroke 2014; no seizure since then  .  Stroke [facility])    at same time he had MI  . Urinary frequency     Past Surgical History:  Procedure Laterality Date  . COLONOSCOPY W/ POLYPECTOMY  2003   no F/U ([place] discussed 12/03/12)  . INSERTION OF MESH N/A 07/16/2017   Procedure: INSERTION OF MESH;  Surgeon: [name], MD;  Location: [place];  Service: General;  Laterality: N/A;  . PEG PLACEMENT  2014  . PEG TUBE REMOVAL  2014  . PROSTATE BIOPSY    . TRACHEOSTOMY  2014   was closed after hospital stay  . VENTRAL HERNIA REPAIR N/A 07/16/2017   Procedure: LAPAROSCOPIC VENTRAL HERNIA REPAIR WITH MESH;  Surgeon: [name], MD;  [facility];  Service: General;  Laterality: N/A;  . wrist aspiration  02/16/12    monosodium urate crystals; Dr Caralyn Guile    Family History  Problem Relation Age of Onset  . Hypertension Father   . Prostate cancer Father   . Diabetes Father   . Cancer Father   . Hypertension Mother   . Diabetes Mother   . Stroke Neg Hx   . Heart disease Neg Hx   . Colon cancer Neg Hx     Social History:  reports that he quit smoking about 51 years ago. His smoking use included cigarettes. He has a 0.25 pack-year smoking history. He has never used smokeless tobacco. He reports that he  does not drink alcohol or use drugs.    Review of Systems   Lipids: Normal levels previously and  not on a statin drug  Lab Results  Component Value Date   CHOL 125 04/30/2019   HDL 46.50 04/30/2019   LDLCALC 67 04/30/2019   TRIG 60.0 04/30/2019   CHOLHDL 3 04/30/2019    Eye exams: Last done in 1/20 without retinopathy     He has mild renal insufficiency of unclear etiology with variable creatinine levels as below:  Lab Results  Component Value Date   CREATININE 1.43 04/30/2019   CREATININE 1.31 03/05/2019   CREATININE 1.58 (H) 12/20/2018     HYPERTENSION: Currently on losartan, amlodipine 5, clonidine 0.3 mg and hydralazine as well as metoprolol  He has been followed by PCP for this  BP Readings from Last 3 Encounters:  05/07/19 134/82  04/15/19 124/70  03/05/19 124/62    Foot exam done in 3/20 with PCP  Physical Examination:  BP 134/82 (BP Location: Left Arm, Patient Position: Sitting, Cuff Size: Normal)   Pulse (!) 51   Ht '5\' 6"'$  (1.676 m)   Wt 185 lb 12.8 oz (84.3 kg)   SpO2 98%   BMI 29.99 kg/m     ASSESSMENT/PLAN:  Diabetes type 2, uncontrolled   See history of present illness for discussion of current diabetes management, blood sugar patterns and problems identified  His A1c is higher at 7.5 compared to 6.6 today  He is on metformin only, previously his 1 mg Amaryl was stopped because of tendency to readings as low as 57  Unable to download his meter and not clear if he has an accurate meter or on expired test strips Not checking blood sugars enough in the evening after dinner Although previously had lost a lot of weight he has regained some Still not getting large portions at meals and trying to walk regularly  Although he may be a good candidate for a GLP-1 drug he does not want brand-name medication For now we will try him on 0.5 mg Amaryl  at bedtime and advised him about potential for hypoglycemia Discussed checking blood sugars at  various times of the day and not just in the morning  HYPERTENSION: This is longstanding Blood pressure is controlled and he will continue to follow-up with PCP and other physicians  Renal dysfunction: To recheck today, last microalbumin normal  Follow-up in 3 months   There are no Patient Instructions on file for this visit.   Elayne Snare 05/07/2019, 4:51 PM

## 2019-05-07 NOTE — Progress Notes (Signed)
n 8

## 2019-05-20 ENCOUNTER — Other Ambulatory Visit: Payer: Self-pay

## 2019-05-20 ENCOUNTER — Telehealth: Payer: Self-pay | Admitting: Endocrinology

## 2019-05-20 MED ORDER — ACCU-CHEK GUIDE VI STRP: ORAL_STRIP | 2 refills | Status: DC

## 2019-05-20 NOTE — Telephone Encounter (Signed)
MEDICATION: Accu Check Guide Test Strips  PHARMACY:  [facility] on [address]  IS THIS A 90 DAY SUPPLY : no  IS PATIENT OUT OF MEDICATION: yes  IF NOT; HOW MUCH IS LEFT:   LAST APPOINTMENT DATE: @12 /23/2020  NEXT APPOINTMENT DATE:@3 /16/2021  DO WE HAVE YOUR PERMISSION TO LEAVE A DETAILED MESSAGE: yes   OTHER COMMENTS:  RX has been sent to [facility], but patient wont get until later in the month.  Patient needs an RX locally until mail order received    **Let patient know to contact pharmacy at the end of the day to make sure medication is ready. **  ** Please notify patient to allow 48-72 hours to process**  **Encourage patient to contact the pharmacy for refills or they can request refills through [facility]**

## 2019-05-20 NOTE — Telephone Encounter (Signed)
Rx sent 

## 2019-05-25 ENCOUNTER — Other Ambulatory Visit: Payer: Self-pay

## 2019-05-25 MED ORDER — AMLODIPINE BESYLATE 10 MG PO TABS: 10.0000 mg | ORAL_TABLET | Freq: Every day | ORAL | 0 refills | Status: DC

## 2019-06-05 ENCOUNTER — Encounter (HOSPITAL_COMMUNITY): Payer: Self-pay | Admitting: Emergency Medicine

## 2019-06-05 ENCOUNTER — Other Ambulatory Visit: Payer: Self-pay

## 2019-06-05 ENCOUNTER — Inpatient Hospital Stay (HOSPITAL_COMMUNITY)
Admission: EM | Admit: 2019-06-05 | Discharge: 2019-06-09 | DRG: 177 | Disposition: A | Discharge status: Home Health | Payer: Medicare HMO | Attending: Internal Medicine | Admitting: Internal Medicine

## 2019-06-05 ENCOUNTER — Emergency Department (HOSPITAL_COMMUNITY): Payer: Medicare HMO

## 2019-06-05 DIAGNOSIS — E119 Type 2 diabetes mellitus without complications: Secondary | ICD-10-CM

## 2019-06-05 DIAGNOSIS — J9601 Acute respiratory failure with hypoxia: Secondary | ICD-10-CM | POA: Diagnosis not present

## 2019-06-05 DIAGNOSIS — R300 Dysuria: Secondary | ICD-10-CM | POA: Diagnosis present

## 2019-06-05 DIAGNOSIS — Z8249 Family history of ischemic heart disease and other diseases of the circulatory system: Secondary | ICD-10-CM | POA: Diagnosis not present

## 2019-06-05 DIAGNOSIS — E1165 Type 2 diabetes mellitus with hyperglycemia: Secondary | ICD-10-CM | POA: Diagnosis present

## 2019-06-05 DIAGNOSIS — N1832 Chronic kidney disease, stage 3b: Secondary | ICD-10-CM | POA: Diagnosis not present

## 2019-06-05 DIAGNOSIS — I252 Old myocardial infarction: Secondary | ICD-10-CM

## 2019-06-05 DIAGNOSIS — J069 Acute upper respiratory infection, unspecified: Secondary | ICD-10-CM

## 2019-06-05 DIAGNOSIS — I129 Hypertensive chronic kidney disease with stage 1 through stage 4 chronic kidney disease, or unspecified chronic kidney disease: Secondary | ICD-10-CM | POA: Diagnosis present

## 2019-06-05 DIAGNOSIS — Z794 Long term (current) use of insulin: Secondary | ICD-10-CM | POA: Diagnosis not present

## 2019-06-05 DIAGNOSIS — Z8042 Family history of malignant neoplasm of prostate: Secondary | ICD-10-CM

## 2019-06-05 DIAGNOSIS — Z7984 Long term (current) use of oral hypoglycemic drugs: Secondary | ICD-10-CM | POA: Diagnosis not present

## 2019-06-05 DIAGNOSIS — E1122 Type 2 diabetes mellitus with diabetic chronic kidney disease: Secondary | ICD-10-CM | POA: Diagnosis present

## 2019-06-05 DIAGNOSIS — N179 Acute kidney failure, unspecified: Secondary | ICD-10-CM | POA: Diagnosis not present

## 2019-06-05 DIAGNOSIS — Z87891 Personal history of nicotine dependence: Secondary | ICD-10-CM | POA: Diagnosis not present

## 2019-06-05 DIAGNOSIS — Z833 Family history of diabetes mellitus: Secondary | ICD-10-CM

## 2019-06-05 DIAGNOSIS — G4733 Obstructive sleep apnea (adult) (pediatric): Secondary | ICD-10-CM | POA: Diagnosis present

## 2019-06-05 DIAGNOSIS — Z923 Personal history of irradiation: Secondary | ICD-10-CM | POA: Diagnosis not present

## 2019-06-05 DIAGNOSIS — R42 Dizziness and giddiness: Secondary | ICD-10-CM | POA: Diagnosis not present

## 2019-06-05 DIAGNOSIS — Z7982 Long term (current) use of aspirin: Secondary | ICD-10-CM

## 2019-06-05 DIAGNOSIS — R0902 Hypoxemia: Secondary | ICD-10-CM | POA: Diagnosis not present

## 2019-06-05 DIAGNOSIS — C61 Malignant neoplasm of prostate: Secondary | ICD-10-CM | POA: Diagnosis present

## 2019-06-05 DIAGNOSIS — Z8546 Personal history of malignant neoplasm of prostate: Secondary | ICD-10-CM

## 2019-06-05 DIAGNOSIS — Z8673 Personal history of transient ischemic attack (TIA), and cerebral infarction without residual deficits: Secondary | ICD-10-CM

## 2019-06-05 DIAGNOSIS — H547 Unspecified visual loss: Secondary | ICD-10-CM | POA: Diagnosis not present

## 2019-06-05 DIAGNOSIS — U071 COVID-19: Secondary | ICD-10-CM | POA: Diagnosis not present

## 2019-06-05 DIAGNOSIS — M109 Gout, unspecified: Secondary | ICD-10-CM | POA: Diagnosis present

## 2019-06-05 DIAGNOSIS — I10 Essential (primary) hypertension: Secondary | ICD-10-CM | POA: Diagnosis present

## 2019-06-05 DIAGNOSIS — R509 Fever, unspecified: Secondary | ICD-10-CM | POA: Diagnosis not present

## 2019-06-05 DIAGNOSIS — E108 Type 1 diabetes mellitus with unspecified complications: Secondary | ICD-10-CM

## 2019-06-05 LAB — CBC WITH DIFFERENTIAL/PLATELET
Abs Immature Granulocytes: 0.01 10*3/uL (ref 0.00–0.07)
Basophils Absolute: 0 10*3/uL (ref 0.0–0.1)
Basophils Relative: 0 %
Eosinophils Absolute: 0 10*3/uL (ref 0.0–0.5)
Eosinophils Relative: 0 %
HCT: 41.5 % (ref 39.0–52.0)
Hemoglobin: 12.9 g/dL — ABNORMAL LOW (ref 13.0–17.0)
Immature Granulocytes: 0 %
Lymphocytes Relative: 23 %
Lymphs Abs: 0.5 10*3/uL — ABNORMAL LOW (ref 0.7–4.0)
MCH: 26.7 pg (ref 26.0–34.0)
MCHC: 31.1 g/dL (ref 30.0–36.0)
MCV: 85.9 fL (ref 80.0–100.0)
Monocytes Absolute: 0.3 10*3/uL (ref 0.1–1.0)
Monocytes Relative: 15 %
Neutro Abs: 1.4 10*3/uL — ABNORMAL LOW (ref 1.7–7.7)
Neutrophils Relative %: 62 %
Platelets: 105 10*3/uL — ABNORMAL LOW (ref 150–400)
RBC: 4.83 MIL/uL (ref 4.22–5.81)
RDW: 12.9 % (ref 11.5–15.5)
WBC: 2.3 10*3/uL — ABNORMAL LOW (ref 4.0–10.5)
nRBC: 0 % (ref 0.0–0.2)

## 2019-06-05 LAB — COMPREHENSIVE METABOLIC PANEL
ALT: 32 U/L (ref 0–44)
AST: 73 U/L — ABNORMAL HIGH (ref 15–41)
Albumin: 3.4 g/dL — ABNORMAL LOW (ref 3.5–5.0)
Alkaline Phosphatase: 57 U/L (ref 38–126)
Anion gap: 12 (ref 5–15)
BUN: 26 mg/dL — ABNORMAL HIGH (ref 8–23)
CO2: 20 mmol/L — ABNORMAL LOW (ref 22–32)
Calcium: 9.6 mg/dL (ref 8.9–10.3)
Chloride: 107 mmol/L (ref 98–111)
Creatinine, Ser: 1.95 mg/dL — ABNORMAL HIGH (ref 0.61–1.24)
GFR calc Af Amer: 39 mL/min — ABNORMAL LOW (ref >60)
GFR calc non Af Amer: 34 mL/min — ABNORMAL LOW (ref >60)
Glucose, Bld: 143 mg/dL — ABNORMAL HIGH (ref 70–99)
Potassium: 4.4 mmol/L (ref 3.5–5.1)
Sodium: 139 mmol/L (ref 135–145)
Total Bilirubin: 0.7 mg/dL (ref 0.3–1.2)
Total Protein: 6.5 g/dL (ref 6.5–8.1)

## 2019-06-05 LAB — PROTIME-INR
INR: 1 (ref 0.8–1.2)
Prothrombin Time: 13 seconds (ref 11.4–15.2)

## 2019-06-05 LAB — URINALYSIS, ROUTINE W REFLEX MICROSCOPIC
Bilirubin Urine: NEGATIVE
Glucose, UA: NEGATIVE mg/dL
Ketones, ur: NEGATIVE mg/dL
Leukocytes,Ua: NEGATIVE
Nitrite: NEGATIVE
Protein, ur: 100 mg/dL — AB
Specific Gravity, Urine: 1.012 (ref 1.005–1.030)
pH: 5 (ref 5.0–8.0)

## 2019-06-05 LAB — LACTIC ACID, PLASMA
Lactic Acid, Venous: 1 mmol/L (ref 0.5–1.9)
Lactic Acid, Venous: 0.8 mmol/L (ref 0.5–1.9)

## 2019-06-05 LAB — CBG MONITORING, ED: Glucose-Capillary: 134 mg/dL — ABNORMAL HIGH (ref 70–99)

## 2019-06-05 LAB — GLUCOSE, CAPILLARY
Glucose-Capillary: 321 mg/dL — ABNORMAL HIGH (ref 70–99)
Glucose-Capillary: 267 mg/dL — ABNORMAL HIGH (ref 70–99)

## 2019-06-05 LAB — FERRITIN: Ferritin: 793 ng/mL — ABNORMAL HIGH (ref 24–336)

## 2019-06-05 LAB — PROCALCITONIN: Procalcitonin: 0.31 ng/mL

## 2019-06-05 LAB — POC SARS CORONAVIRUS 2 AG -  ED: SARS Coronavirus 2 Ag: POSITIVE — AB

## 2019-06-05 LAB — C-REACTIVE PROTEIN: CRP: 2 mg/dL — ABNORMAL HIGH (ref <1.0)

## 2019-06-05 LAB — TRIGLYCERIDES: Triglycerides: 128 mg/dL (ref <150)

## 2019-06-05 LAB — FIBRINOGEN: Fibrinogen: 384 mg/dL (ref 210–475)

## 2019-06-05 LAB — D-DIMER, QUANTITATIVE: D-Dimer, Quant: 2.14 ug/mL-FEU — ABNORMAL HIGH (ref 0.00–0.50)

## 2019-06-05 LAB — APTT: aPTT: 29 seconds (ref 24–36)

## 2019-06-05 MED ORDER — HYDRALAZINE HCL 25 MG PO TABS
50.0000 mg | ORAL_TABLET | Freq: Three times a day (TID) | ORAL | Status: DC
  Administered 2019-06-05 – 2019-06-09 (×11): 50 mg via ORAL
  Filled 2019-06-05 (×13): qty 2

## 2019-06-05 MED ORDER — HYDROCOD POLST-CPM POLST ER 10-8 MG/5ML PO SUER: 5.0000 mL | Freq: Two times a day (BID) | ORAL | Status: DC | PRN

## 2019-06-05 MED ORDER — VANCOMYCIN HCL IN DEXTROSE 1-5 GM/200ML-% IV SOLN: 1000.0000 mg | Freq: Once | INTRAVENOUS | Status: DC

## 2019-06-05 MED ORDER — CLONIDINE HCL 0.1 MG PO TABS
0.3000 mg | ORAL_TABLET | Freq: Two times a day (BID) | ORAL | Status: DC
  Administered 2019-06-05: 0.3 mg via ORAL
  Filled 2019-06-05: qty 1

## 2019-06-05 MED ORDER — CLONIDINE HCL 0.1 MG PO TABS
0.1000 mg | ORAL_TABLET | Freq: Two times a day (BID) | ORAL | Status: DC
  Administered 2019-06-06 – 2019-06-09 (×7): 0.1 mg via ORAL
  Filled 2019-06-05 (×7): qty 1

## 2019-06-05 MED ORDER — ALBUTEROL SULFATE HFA 108 (90 BASE) MCG/ACT IN AERS
2.0000 | INHALATION_SPRAY | RESPIRATORY_TRACT | Status: DC | PRN
  Filled 2019-06-05: qty 6.7

## 2019-06-05 MED ORDER — SODIUM CHLORIDE 0.9 % IV SOLN
2.0000 g | INTRAVENOUS | Status: DC
  Filled 2019-06-05: qty 2

## 2019-06-05 MED ORDER — DEXAMETHASONE SODIUM PHOSPHATE 10 MG/ML IJ SOLN
6.0000 mg | INTRAMUSCULAR | Status: DC
  Administered 2019-06-06 – 2019-06-09 (×4): 6 mg via INTRAVENOUS
  Filled 2019-06-05 (×4): qty 1

## 2019-06-05 MED ORDER — SODIUM CHLORIDE 0.9 % IV SOLN
100.0000 mg | Freq: Every day | INTRAVENOUS | Status: AC
  Administered 2019-06-06 – 2019-06-09 (×4): 100 mg via INTRAVENOUS
  Filled 2019-06-05 (×4): qty 20

## 2019-06-05 MED ORDER — DEXAMETHASONE SODIUM PHOSPHATE 10 MG/ML IJ SOLN
6.0000 mg | Freq: Once | INTRAMUSCULAR | Status: AC
  Administered 2019-06-05: 6 mg via INTRAVENOUS
  Filled 2019-06-05: qty 1

## 2019-06-05 MED ORDER — FLEET ENEMA 7-19 GM/118ML RE ENEM
1.0000 | ENEMA | Freq: Once | RECTAL | Status: DC | PRN
  Filled 2019-06-05: qty 1

## 2019-06-05 MED ORDER — AMLODIPINE BESYLATE 10 MG PO TABS
10.0000 mg | ORAL_TABLET | Freq: Every day | ORAL | Status: DC
  Administered 2019-06-05 – 2019-06-09 (×5): 10 mg via ORAL
  Filled 2019-06-05: qty 1
  Filled 2019-06-05: qty 2
  Filled 2019-06-05 (×3): qty 1

## 2019-06-05 MED ORDER — SODIUM CHLORIDE 0.9 % IV SOLN
2.0000 g | Freq: Once | INTRAVENOUS | Status: AC
  Administered 2019-06-05: 2 g via INTRAVENOUS
  Filled 2019-06-05: qty 2

## 2019-06-05 MED ORDER — SODIUM CHLORIDE 0.9% FLUSH
3.0000 mL | Freq: Two times a day (BID) | INTRAVENOUS | Status: DC
  Administered 2019-06-05 – 2019-06-09 (×9): 3 mL via INTRAVENOUS

## 2019-06-05 MED ORDER — ACETAMINOPHEN 325 MG PO TABS
650.0000 mg | ORAL_TABLET | Freq: Once | ORAL | Status: AC
  Administered 2019-06-05: 650 mg via ORAL
  Filled 2019-06-05: qty 2

## 2019-06-05 MED ORDER — POLYETHYLENE GLYCOL 3350 17 G PO PACK: 17.0000 g | PACK | Freq: Every day | ORAL | Status: DC | PRN

## 2019-06-05 MED ORDER — SODIUM CHLORIDE 0.9 % IV SOLN
200.0000 mg | Freq: Once | INTRAVENOUS | Status: AC
  Administered 2019-06-05: 200 mg via INTRAVENOUS
  Filled 2019-06-05: qty 200

## 2019-06-05 MED ORDER — OXYCODONE HCL 5 MG PO TABS: 5.0000 mg | ORAL_TABLET | ORAL | Status: DC | PRN

## 2019-06-05 MED ORDER — SODIUM CHLORIDE 0.9% FLUSH
3.0000 mL | INTRAVENOUS | Status: DC | PRN
  Administered 2019-06-05: 3 mL via INTRAVENOUS

## 2019-06-05 MED ORDER — ONDANSETRON HCL 4 MG/2ML IJ SOLN: 4.0000 mg | Freq: Four times a day (QID) | INTRAMUSCULAR | Status: DC | PRN

## 2019-06-05 MED ORDER — ACETAMINOPHEN 650 MG RE SUPP
650.0000 mg | Freq: Once | RECTAL | Status: AC
  Administered 2019-06-05: 650 mg via RECTAL
  Filled 2019-06-05: qty 1

## 2019-06-05 MED ORDER — METOPROLOL SUCCINATE ER 100 MG PO TB24
100.0000 mg | ORAL_TABLET | Freq: Every day | ORAL | Status: DC
  Filled 2019-06-05: qty 1

## 2019-06-05 MED ORDER — ISOSORBIDE MONONITRATE ER 60 MG PO TB24
60.0000 mg | ORAL_TABLET | Freq: Two times a day (BID) | ORAL | Status: DC
  Administered 2019-06-05 – 2019-06-09 (×8): 60 mg via ORAL
  Filled 2019-06-05 (×6): qty 1
  Filled 2019-06-05 (×3): qty 2
  Filled 2019-06-05 (×2): qty 1

## 2019-06-05 MED ORDER — ENOXAPARIN SODIUM 40 MG/0.4ML ~~LOC~~ SOLN
40.0000 mg | SUBCUTANEOUS | Status: DC
  Administered 2019-06-05 – 2019-06-09 (×5): 40 mg via SUBCUTANEOUS
  Filled 2019-06-05 (×5): qty 0.4

## 2019-06-05 MED ORDER — VANCOMYCIN HCL 1750 MG/350ML IV SOLN
1750.0000 mg | INTRAVENOUS | Status: DC
  Filled 2019-06-05: qty 350

## 2019-06-05 MED ORDER — METRONIDAZOLE IN NACL 5-0.79 MG/ML-% IV SOLN
500.0000 mg | Freq: Once | INTRAVENOUS | Status: AC
  Administered 2019-06-05: 500 mg via INTRAVENOUS
  Filled 2019-06-05: qty 100

## 2019-06-05 MED ORDER — SODIUM CHLORIDE 0.9 % IV SOLN: 250.0000 mL | INTRAVENOUS | Status: DC | PRN

## 2019-06-05 MED ORDER — BISACODYL 5 MG PO TBEC: 5.0000 mg | DELAYED_RELEASE_TABLET | Freq: Every day | ORAL | Status: DC | PRN

## 2019-06-05 MED ORDER — VANCOMYCIN HCL 1500 MG/300ML IV SOLN
1500.0000 mg | Freq: Once | INTRAVENOUS | Status: DC
  Filled 2019-06-05: qty 300

## 2019-06-05 MED ORDER — GUAIFENESIN-DM 100-10 MG/5ML PO SYRP
10.0000 mL | ORAL_SOLUTION | ORAL | Status: DC | PRN
  Administered 2019-06-06: 10 mL via ORAL
  Filled 2019-06-05: qty 10

## 2019-06-05 MED ORDER — ONDANSETRON HCL 4 MG PO TABS: 4.0000 mg | ORAL_TABLET | Freq: Four times a day (QID) | ORAL | Status: DC | PRN

## 2019-06-05 MED ORDER — INSULIN ASPART 100 UNIT/ML ~~LOC~~ SOLN
0.0000 [IU] | Freq: Every day | SUBCUTANEOUS | Status: DC
  Administered 2019-06-07 – 2019-06-08 (×2): 2 [IU] via SUBCUTANEOUS

## 2019-06-05 MED ORDER — ACETAMINOPHEN 325 MG PO TABS: 650.0000 mg | ORAL_TABLET | Freq: Four times a day (QID) | ORAL | Status: DC | PRN

## 2019-06-05 MED ORDER — INSULIN ASPART 100 UNIT/ML ~~LOC~~ SOLN
0.0000 [IU] | Freq: Three times a day (TID) | SUBCUTANEOUS | Status: DC
  Administered 2019-06-05: 5 [IU] via SUBCUTANEOUS
  Administered 2019-06-05: 7 [IU] via SUBCUTANEOUS
  Administered 2019-06-06: 2 [IU] via SUBCUTANEOUS
  Administered 2019-06-06: 9 [IU] via SUBCUTANEOUS
  Administered 2019-06-06: 7 [IU] via SUBCUTANEOUS
  Administered 2019-06-07: 5 [IU] via SUBCUTANEOUS
  Administered 2019-06-07: 2 [IU] via SUBCUTANEOUS
  Administered 2019-06-07 – 2019-06-08 (×2): 5 [IU] via SUBCUTANEOUS
  Administered 2019-06-08: 1 [IU] via SUBCUTANEOUS
  Administered 2019-06-08 – 2019-06-09 (×2): 5 [IU] via SUBCUTANEOUS

## 2019-06-05 MED ORDER — ASPIRIN EC 81 MG PO TBEC
81.0000 mg | DELAYED_RELEASE_TABLET | Freq: Every day | ORAL | Status: DC
  Administered 2019-06-05 – 2019-06-09 (×5): 81 mg via ORAL
  Filled 2019-06-05 (×5): qty 1

## 2019-06-05 NOTE — Progress Notes (Signed)
Pharmacy Antibiotic Note  Corey [name] is a 75 y.o. male admitted on 06/05/2019 with sepsis.  Pharmacy has been consulted for vancomycin and cefepime dosing.  Plan: Cefepime 2gm IV q24 hours Vancomycin 1500 mg IV x 1 then 1750 q36 hours F/u renal function, cultures an clinical course  Height: 5\' 6"  (167.6 cm) Weight: 185 lb (83.9 kg) IBW/kg (Calculated) : 63.8  Temp (24hrs), Avg:100.5 F (38.1 C), Min:98 F (36.7 C), Max:103 F (39.4 C)  Recent Labs  Lab 06/05/19 0457  CREATININE 1.95*    Estimated Creatinine Clearance: 35.3 mL/min (A) (by C-G formula based on SCr of 1.95 mg/dL (H)).    Allergies  Allergen Reactions  . Hydrochlorothiazide Other (See Comments)    Gout , uncontrolled diabetes and renal insufficiency  . Allopurinol Other (See Comments)    Renal insufficiency     Thank you for allowing pharmacy to be a part of this patient's care.  Excell Seltzer P[place]et 06/05/2019 6:15 AM

## 2019-06-05 NOTE — ED Provider Notes (Signed)
Received signout at the beginning of shift.  Please see previous providers note for complete H&P.  This is a 74 year old male who came in initially complaining of lightheadedness and dizziness for the past month.  He was found to be febrile with a temperature of 103.  Code sepsis initiated initial chest x-ray shows pneumonia patient was given broad-spectrum antibiotic.  Subsequently COVID-19 test came back and positive.  Furthermore, patient was initially hypoxic with room air at 85-88% improved with 1 L of supplemental oxygen.  Appreciate consultation from [facility] hospitalist, Dr. Inda Merlin, who agrees to see and admit patient for respiratory distress secondary to COVID-19.  Patient may benefit from further evaluation of his dizziness while inpatient.  CRITICAL CARE Performed by: Domenic Moras Total critical care time: 30 minutes Critical care time was exclusive of separately billable procedures and treating other patients. Critical care was necessary to treat or prevent imminent or life-threatening deterioration. Critical care was time spent personally by me on the following activities: development of treatment plan with patient and/or surrogate as well as nursing, discussions with consultants, evaluation of patient's response to treatment, examination of patient, obtaining history from patient or surrogate, ordering and performing treatments and interventions, ordering and review of laboratory studies, ordering and review of radiographic studies, pulse oximetry and re-evaluation of patient's condition.   BP (!) 180/92   Pulse 64   Temp (!) 103 F (39.4 C) (Oral)   Resp (!) 29   Ht 5' 6"  (1.676 m)   Wt 83.9 kg   SpO2 97%   BMI 29.86 kg/m   Results for orders placed or performed during the hospital encounter of 06/05/19  Lactic acid, plasma  Result Value Ref Range   Lactic Acid, Venous 1.0 0.5 - 1.9 mmol/L  Comprehensive metabolic panel  Result Value Ref Range   Sodium 139 135 - 145 mmol/L   Potassium 4.4 3.5 - 5.1 mmol/L   Chloride 107 98 - 111 mmol/L   CO2 20 (L) 22 - 32 mmol/L   Glucose, Bld 143 (H) 70 - 99 mg/dL   BUN 26 (H) 8 - 23 mg/dL   Creatinine, Ser 1.95 (H) 0.61 - 1.24 mg/dL   Calcium 9.6 8.9 - 10.3 mg/dL   Total Protein 6.5 6.5 - 8.1 g/dL   Albumin 3.4 (L) 3.5 - 5.0 g/dL   AST 73 (H) 15 - 41 U/L   ALT 32 0 - 44 U/L   Alkaline Phosphatase 57 38 - 126 U/L   Total Bilirubin 0.7 0.3 - 1.2 mg/dL   GFR calc non Af Amer 34 (L) >60 mL/min   GFR calc Af Amer 39 (L) >60 mL/min   Anion gap 12 5 - 15  CBC WITH DIFFERENTIAL  Result Value Ref Range   WBC 2.3 (L) 4.0 - 10.5 K/uL   RBC 4.83 4.22 - 5.81 MIL/uL   Hemoglobin 12.9 (L) 13.0 - 17.0 g/dL   HCT 41.5 39.0 - 52.0 %   MCV 85.9 80.0 - 100.0 fL   MCH 26.7 26.0 - 34.0 pg   MCHC 31.1 30.0 - 36.0 g/dL   RDW 12.9 11.5 - 15.5 %   Platelets 105 (L) 150 - 400 K/uL   nRBC 0.0 0.0 - 0.2 %   Neutrophils Relative % 62 %   Neutro Abs 1.4 (L) 1.7 - 7.7 K/uL   Lymphocytes Relative 23 %   Lymphs Abs 0.5 (L) 0.7 - 4.0 K/uL   Monocytes Relative 15 %   Monocytes Absolute 0.3 0.1 -  1.0 K/uL   Eosinophils Relative 0 %   Eosinophils Absolute 0.0 0.0 - 0.5 K/uL   Basophils Relative 0 %   Basophils Absolute 0.0 0.0 - 0.1 K/uL   Immature Granulocytes 0 %   Abs Immature Granulocytes 0.01 0.00 - 0.07 K/uL  APTT  Result Value Ref Range   aPTT 29 24 - 36 seconds  Protime-INR  Result Value Ref Range   Prothrombin Time 13.0 11.4 - 15.2 seconds   INR 1.0 0.8 - 1.2  POC SARS Coronavirus 2 Ag-ED - Nasal Swab (BD Veritor Kit)  Result Value Ref Range   SARS Coronavirus 2 Ag POSITIVE (A) NEGATIVE   DG Chest Port 1 View  Result Date: 06/05/2019 CLINICAL DATA:  Fever. Dizziness. EXAM: PORTABLE CHEST 1 VIEW COMPARISON:  03/05/2019 FINDINGS: Lower most aspect of the left chest not entirely included in the field of view. The heart is enlarged, similar to prior exam allowing for differences in technique. Unchanged mediastinal contours. Patchy  bilateral airspace disease both mid lung zones and right infrahilar region. Left basilar evaluation slightly limited by overlapping soft tissue. No pneumothorax. Limited assessment for pleural effusion. IMPRESSION: 1. Patchy bilateral airspace disease suspicious for pneumonia. Pulmonary edema is felt less likely. 2. Stable cardiomegaly. Electronically Signed   By: Keith Rake M.D.   On: 06/05/2019 05:42      Domenic Moras, PA-C 06/05/19 [place], [place], MD 06/06/19 [id number]

## 2019-06-05 NOTE — ED Notes (Signed)
Placed on [place] for sats dipping to 89% at rest in bed

## 2019-06-05 NOTE — ED Triage Notes (Signed)
  Patient BIB EMS for dizziness that has been going on for about 1 month.  Patient was seen for issue and has been unable to schedule an appointment due to [place].  Patient states he has no pain but dizzy and unsteady gait.  Patient states he will feel ok and then has a hard time getting around.  Hx of stroke 10 yrs ago.  A&O x4.

## 2019-06-05 NOTE — H&P (Signed)
History and Physical    Corey [name] WVP:[id number] DOB: Mar 07, 1946 DOA: 06/05/2019  PCP: [name], MD Consultants:  Dwyane Dee - endocrinology; [facility] - cardiology; [facility] - pulmonology; Tomi Likens - neurology Patient coming from:  Home - lives with ; NOK: Wife, Mills Mitton, [phone]  Chief Complaint: Dizziness for a month  HPI: Corey [name] is a 74 y.o. male with medical history significant of CVA; seizures (not on AED); prostate CA; OSA on CPAP; CAD; HTN; and DM presenting with dizziness for a month.  The patient reports that he has been sick for about 6 weeks with dizziness.  He further reports that he doesn't know how he got COVID, but he lives with his son who had COVID.  His wife reports that "it wasn't a bad COVID", able to come out quarantine Sunday.  His wife reports that despite his son's illness, her husband has been going to church and helping people load things into their cars.   ED Course: COVID - presented with dizziness, sepsis workup due to fever, given abx, then COVID +.  On 2L currently.  Review of Systems: As per HPI; otherwise review of systems reviewed and negative.    Past Medical History:  Diagnosis Date  . Cataract   . Colon polyp 2003   Dr [name]; F/U was to be 2008( not completed)  . CVA (cerebral infarction) 2014  . Diabetes mellitus   . Diverticulosis 2003  . Dyspnea    with exertion  . Gout   . Hypertension   . Myocardial infarction ([place]) 2014  . OSA (obstructive sleep apnea) 03/27/2018  . Pneumonia 09/29/11   Avelox X 10 days as OP  . Prostate cancer ([place])   . Renal insufficiency   . Seizures ([place])    not treated for seizure disorder; had a seizure after stroke 2014; no seizure since then    Past Surgical History:  Procedure Laterality Date  . COLONOSCOPY W/ POLYPECTOMY  2003   no F/U ([place] discussed 12/03/12)  . INSERTION OF MESH N/A 07/16/2017   Procedure: INSERTION OF MESH;  Surgeon: [name], MD;  Location: [facility];  Service:  General;  Laterality: N/A;  . PEG PLACEMENT  2014  . PEG TUBE REMOVAL  2014  . PROSTATE BIOPSY    . TRACHEOSTOMY  2014   was closed after hospital stay  . VENTRAL HERNIA REPAIR N/A 07/16/2017   Procedure: LAPAROSCOPIC VENTRAL HERNIA REPAIR WITH MESH;  Surgeon: [name], MD;  Location: [facility];  Service: General;  Laterality: N/A;  . wrist aspiration  02/16/12    monosodium urate crystals; Dr Caralyn Guile    Social History   Socioeconomic History  . Marital status: Married    Spouse name: [name]  . Number of children: 2  . Years of education: 75  . Highest education level: 12th grade  Occupational History  . Not on file  Tobacco Use  . Smoking status: Former Smoker    Packs/day: 0.25    Years: 1.00    Pack years: 0.25    Types: Cigarettes    Quit date: 05/15/1968    Years since quitting: 51.0  . Smokeless tobacco: Never Used  . Tobacco comment: smoked [place], up to 1-2 cigarettes/ day  Substance and Sexual Activity  . Alcohol use: No    Alcohol/week: 0.0 standard drinks  . Drug use: No  . Sexual activity: Yes  Other Topics Concern  . Not on file  Social History  Narrative   Walks three times a week, 2-3 miles, occasional walks on treadmill       Patient is right-handed. He lives with his wife in a one level home. He drinks 1 diet Mt. Dew a day and rarely has coffee. He walks 3 x a week for exercise.   Social Determinants of Health   Financial Resource Strain:   . Difficulty of Paying Living Expenses: Not on file  Food Insecurity:   . Worried About Charity fundraiser in the Last Year: Not on file  . Ran Out of Food in the Last Year: Not on file  Transportation Needs:   . Lack of Transportation (Medical): Not on file  . Lack of Transportation (Non-Medical): Not on file  Physical Activity:   . Days of Exercise per Week: Not on file  . Minutes of Exercise per Session: Not on file  Stress:   . Feeling of Stress : Not on file  Social Connections:   . Frequency  of Communication with Friends and Family: Not on file  . Frequency of Social Gatherings with Friends and Family: Not on file  . Attends Religious Services: Not on file  . Active Member of Clubs or Organizations: Not on file  . Attends Archivist Meetings: Not on file  . Marital Status: Not on file  Intimate Partner Violence:   . Fear of Current or Ex-Partner: Not on file  . Emotionally Abused: Not on file  . Physically Abused: Not on file  . Sexually Abused: Not on file    Allergies  Allergen Reactions  . Hydrochlorothiazide Other (See Comments)    Gout , uncontrolled diabetes and renal insufficiency  . Allopurinol Other (See Comments)    Renal insufficiency    Family History  Problem Relation Age of Onset  . Hypertension Father   . Prostate cancer Father   . Diabetes Father   . Cancer Father   . Hypertension Mother   . Diabetes Mother   . Stroke Neg Hx   . Heart disease Neg Hx   . Colon cancer Neg Hx     Prior to Admission medications   Medication Sig Start Date End Date Taking? Authorizing Provider  Accu-Chek FastClix Lancets MISC Use Accu Chek fastclix lancets to check blood sugar twice daily. DX:E11.65 05/07/19   Elayne Snare, MD  albuterol (VENTOLIN HFA) 108 (90 Base) MCG/ACT inhaler Inhale 2 puffs into the lungs every 4 (four) hours as needed for wheezing or shortness of breath. 03/01/19   Tower, Wynelle Fanny, MD  amLODipine (NORVASC) 10 MG tablet Take 1 tablet (10 mg total) by mouth daily. 05/25/19   [name], MD  aspirin EC 81 MG tablet Take 1 tablet (81 mg total) by mouth daily. 01/23/17   [name], MD  Blood Glucose Monitoring Suppl (ACCU-CHEK GUIDE ME) w/Device KIT 1 each by Does not apply route 2 (two) times daily. Use Accu Chek Guide Me device to check blood sugar twice daily.    [provider]  cloNIDine (CATAPRES) 0.3 MG tablet TAKE 1 TABLET TWICE DAILY 01/10/19   [name], MD  colchicine (COLCRYS) 0.6 MG tablet Take 2 tabs once  and then one tab one hour later as needed for gout 07/23/18   [name], MD  furosemide (LASIX) 20 MG tablet Take 2 pills x 1 day, then 1 pill daily in the morning Patient taking differently: Take 20 mg by mouth 2 (two) times daily. Take  1 tablet by mouth twice daily. 03/05/19   [name], MD  glimepiride (AMARYL) 1 MG tablet Take 1/2 tab at bedtime 05/07/19   Elayne Snare, MD  glucose blood (ACCU-CHEK GUIDE) test strip Use Accu Chek guide test strips as instructed to check blood sugar twice daily. DX:E11.65 05/20/19   Elayne Snare, MD  hydrALAZINE (APRESOLINE) 50 MG tablet Take 1 tablet (50 mg total) by mouth 3 (three) times daily. 07/23/18   [name], MD  isosorbide mononitrate (IMDUR) 60 MG 24 hr tablet TAKE 1 TABLET TWICE DAILY 10/23/18   Burns, [name], MD  losartan (COZAAR) 100 MG tablet TAKE 1 TABLET (100 MG TOTAL) BY MOUTH DAILY. 01/10/19   [name], MD  metFORMIN (GLUCOPHAGE-XR) 750 MG 24 hr tablet Take 1 tablet (750 mg total) by mouth 2 (two) times daily. Take 1 tablet by mouth twice daily. 01/02/19   Elayne Snare, MD  metoprolol succinate (TOPROL-XL) 100 MG 24 hr tablet Take 1 tablet (100 mg total) by mouth daily. Take with or immediately following a meal. 04/15/19 07/14/19  Jerline Pain, MD  oxybutynin (DITROPAN-XL) 10 MG 24 hr tablet Take 10 mg by mouth as needed. 05/22/18   [provider]    Physical Exam: Vitals:   06/05/19 1300 06/05/19 1315 06/05/19 1330 06/05/19 1345  BP: 108/69 114/71 106/64 117/69  Pulse:      Resp: (!) 30 (!) 29 (!) 32 (!) 29  Temp:      TempSrc:      SpO2:      Weight:      Height:         . General:  Appears calm and comfortable and is NAD . Eyes:  PERRL, EOMI, normal lids, iris . ENT:  grossly normal hearing, lips & tongue, mmm . Neck:  no LAD, masses or thyromegaly . Cardiovascular:  RRR, no m/r/g. No LE edema.  Marland Kitchen Respiratory:   CTA bilaterally with no wheezes/rales/rhonchi.  Mildly increased respiratory effort on 2L [place]  O2. . Abdomen:  soft, NT, ND, NABS . Skin:  no rash or induration seen on limited exam . Musculoskeletal:  grossly normal tone BUE/BLE, good ROM, no bony abnormality . Psychiatric:  grossly normal mood and affect, speech fluent and appropriate, AOx3, poor historian . Neurologic:  CN 2-12 grossly intact, moves all extremities in coordinated fashion    Radiological Exams on Admission: DG Chest Port 1 View  Result Date: 06/05/2019 CLINICAL DATA:  Fever. Dizziness. EXAM: PORTABLE CHEST 1 VIEW COMPARISON:  03/05/2019 FINDINGS: Lower most aspect of the left chest not entirely included in the field of view. The heart is enlarged, similar to prior exam allowing for differences in technique. Unchanged mediastinal contours. Patchy bilateral airspace disease both mid lung zones and right infrahilar region. Left basilar evaluation slightly limited by overlapping soft tissue. No pneumothorax. Limited assessment for pleural effusion. IMPRESSION: 1. Patchy bilateral airspace disease suspicious for pneumonia. Pulmonary edema is felt less likely. 2. Stable cardiomegaly. Electronically Signed   By: Keith Rake M.D.   On: 06/05/2019 05:42    EKG: Independently reviewed.  NSR with rate 65; LAFB; LVH; no evidence of acute ischemia   Labs on Admission: I have personally reviewed the available labs and imaging studies at the time of the admission.  Pertinent labs:   CO2 20 Glucose 143 BUN 26/Creatinine 1.95/GFR 39; 26/1.43/59 on 12/16 Albumin 3.4 AST 73/ALT 32; normal on 12/16 Lactate 1.0 WBC 2.3, lymphopenia,leukopenia; normal in 10/20 Hgb  12.9 Platelets 105; normal in 10/20 INR 1.0 Ferritin 793 CRP 2.0 Procalcitonin 0.31 D-dimer 2.14 Fibrinogen 384 COVID POSITIVE Blood cultures pending   Assessment/Plan Principal Problem:   Acute respiratory disease due to COVID-19 virus Active Problems:   Essential hypertension   Diabetes (HCC)   Prostate cancer (HCC)   Stage 3b chronic kidney  disease   OSA (obstructive sleep apnea)   Acute respiratory failure with hypoxia due to COVID-19 infection -Patient with presenting with primary c/o dizziness and weakness; he was febrile on presentation -On further questioning, despite note knowing how he could have gotten COVID-19 infection, he reported that his son lives in their house and is infected at this time -Mild anorexia noted without the presence of other GI symptoms -He does not have a usual home O2 requirement and is currently requiring 2L [place] O2 -COVID POSITIVE -The patient has comorbidities which may increase the risk for ARDS/MODS including: age, HTN, DM -Pertinent labs concerning for COVID include leukopenia/lymphopenia; increased BUN/Creatinine; increased LFTs; markedly elevated D-dimer (>1);  increased ferritin -CXR with multifocal opacities which may be c/w COVID vs. Multifocal PNA  -Will not treat with broad-spectrum antibiotics given procalcitonin <0.5 -Will admit to [facility] for further evaluation, close monitoring, and treatment -Monitor on telemetry x at least 24 hours -At this time, will attempt to avoid use of aerosolized medications and use HFAs instead -Will check daily labs including BMP with Mag, Phos; LFTs; CBC with differential; CRP; ferritin; fibrinogen; D-dimer -Will order steroids (1 mg/kg divided BID) and Remdesivir (pharmacy consult) given +COVID test, +CXR, and hypoxia <94% on room air -If the patient shows clinical deterioration, consider transfer to ICU with PCCM consultation -If the patient is hypoxic or on >3L oxygen from baseline or CRP >7, considerTocilizumab 8 mg/kg x1; does not currently appear to need this medication.  -Will attempt to maintain euvolemia to a net negative fluid status -Will ask the patient to maintain an awake prone position for 16+ hours a day, if possible, with a minimum of 2-3 hours at a time -With D-dimer <5, will use standard-dosed Lovenox for DVT prevention -Patient was seen  wearing full PPE including: gown, gloves, head cover, N95, and face shield; donning and doffing was in compliance with current standards.  Stage 3b CKD -While there may be an acute component, his last creatinine in Epic was quite elevated in 2016 and this appears to be more likely progressive CKD -Avoid nephrotoxic medications -Hold Cozaar, Glucophage for now  HTN -Continue Norvasc, Catapres, hydralazine, Toprol XL -Hold Cozaar given renal dysfunction  DM, uncontrolled -Recent A1c was 7.5, indicating suboptimal control -hold Metformin, Amaryl -Cover with sensitive-scale SSI  OSA -We do not use in CPAP in the setting of COVID-19 infection due to increased risk of aerosolization  Prostate CA -Treated with radiation treatment in 2017   DVT prophylaxis:  Lovenox  Code Status:  Full - confirmed with patient Family Communication: None present; I spoke with the patient's wife by telephone. Disposition Plan:  Home once clinically improved Consults called: None  Admission status: Admit - It is my clinical opinion that admission to INPATIENT is reasonable and necessary because of the expectation that this patient will require hospital care that crosses at least 2 midnights to treat this condition based on the medical complexity of the problems presented.  Given the aforementioned information, the predictability of an adverse outcome is felt to be significant.     Karmen Bongo MD [facility]   How to contact the [facility]  Attending or Consulting provider [place] or covering provider during after hours [place], for this patient?  1. Check the care team in [facility] and look for a) attending/consulting [facility] provider listed and b) the [facility] team listed 2. Log into [email or web address] and use [place] universal password to access. If you do not have the password, please contact the hospital operator. 3. Locate the [facility] provider you are looking for under [facility] and page to a number that you  can be directly reached. 4. If you still have difficulty reaching the provider, please page the [facility] (Director on Call) for the Hospitalists listed on amion for assistance.   06/05/2019, 1:55 PM

## 2019-06-05 NOTE — Plan of Care (Signed)
Plan of Care reviewed. 

## 2019-06-05 NOTE — Patient Outreach (Signed)
[facility]) Care Management  06/05/2019  Corey [name] Sep 13, 1945 [id number]   Telephone Screen  Referral Date: 06/04/2019 Referral Source:Patient/Self Referral Referral Reason: "patients needs help getting eye exam, doesn't have eye MD" Insurance: [facility]   Referral received. Upon chart reviewed noted that patient admitted tot he hospital on today with COVID-19.  Plan: RN CM will continue to follow for discharge plans and disposition.    Enzo Montgomery, RN,BSN,CCM [facility] Telephonic Care Management Coordinator Direct Phone: [phone] Toll Free: [phone] Fax: [phone]

## 2019-06-05 NOTE — ED Provider Notes (Signed)
[facility] EMERGENCY DEPARTMENT Provider Note   CSN: [id number] Arrival date & time: 06/05/19  0435     History Chief Complaint  Patient presents with  . Dizziness    Corey [name] is a 74 y.o. male with a history of hypertension, diabetes mellitus, hypertension, renal insufficiency, & prior stroke who presents to the ED with complaints of dizziness x 1 month & generally not feeling well for the past few days.   Patient reports dizziness described as the room spinning and lightheadedness like he might pass out intermittently x 1 month. Worse with position changes but can occur when seated/at rest. No other alleviating factors. With this reports difficulty with balance/gait, difficulty focusing his vision bilaterally, & intermittent headaches (gradual onset, steady progression). Has baseline stutter to speech that is unchanged. Denies slurred speed, numbness, or weakness. Denies recent head injury. He also reports generally not feeling well with subjective fever, chills, loss of taste/smell, dry cough, and loose stools for the past few days. No alleviating/aggravating factors. Recently around his son who had COVID. Also mentions dysuria for past couple weeks. Denies chest pain, dyspnea, vomiting, or abdominal pain.   HPI     Past Medical History:  Diagnosis Date  . Cataract   . Colon polyp 2003   Dr [name]; F/U was to be 2008( not completed)  . CVA (cerebral infarction) 2014  . Diabetes mellitus   . Diverticulosis 2003  . Dyspnea    with exertion  . Gout   . Hypertension   . Myocardial infarction ([place]) 2014  . OSA (obstructive sleep apnea) 03/27/2018  . Pneumonia 09/29/11   Avelox X 10 days as OP  . Prostate cancer ([place])   . Renal insufficiency   . Seizures ([place])    not treated for seizure disorder; had a seizure after stroke 2014; no seizure since then  . Stroke [facility])    at same time he had MI  . Urinary frequency     Patient Active Problem List   Diagnosis Date Noted  . DOE (dyspnea on exertion) 03/01/2019  . Mild cognitive impairment 12/05/2018  . Slurred speech 07/23/2018  . Muscle twitching 07/23/2018  . OSA (obstructive sleep apnea) 03/27/2018  . Snores 01/21/2018  . Poor balance 01/23/2017  . Ankle swelling, left 09/06/2016  . Bradycardia 01/28/2016  . Weak urinary stream 12/31/2015  . Chronic kidney disease 09/15/2015  . Ventral hernia without obstruction or gangrene 09/15/2015  . Prostate cancer ([place]) 07/25/2015  . Erectile dysfunction 05/04/2015  . Urinary frequency 05/04/2015  . 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 Abnormal finding on MRI of brain 02/14/2015  . PEA (Pulseless electrical activity) ([place]) 12/03/2012  . Diabetes ([place]) 06/30/2011  . Gout 03/05/2007  . Essential hypertension 03/05/2007    Past Surgical History:  Procedure Laterality Date  . COLONOSCOPY W/ POLYPECTOMY  2003   no F/U ([place] discussed 12/03/12)  . INSERTION OF MESH N/A 07/16/2017   Procedure: INSERTION OF MESH;  Surgeon: [name], MD;  Location: [facility];  Service: General;  Laterality: N/A;  . PEG PLACEMENT  2014  . PEG TUBE REMOVAL  2014  . PROSTATE BIOPSY    . TRACHEOSTOMY  2014   was closed after hospital stay  . VENTRAL HERNIA REPAIR N/A 07/16/2017   Procedure: LAPAROSCOPIC VENTRAL HERNIA REPAIR WITH MESH;  Surgeon: [name], MD;  Location: [facility];  Service: General;  Laterality: N/A;  . wrist aspiration  02/16/12  monosodium urate crystals; Dr Caralyn Guile       Family History  Problem Relation Age of Onset  . Hypertension Father   . Prostate cancer Father   . Diabetes Father   . Cancer Father   . Hypertension Mother   . Diabetes Mother   . Stroke Neg Hx   . Heart disease Neg Hx   . Colon cancer Neg Hx     Social History   Tobacco Use  . Smoking status: Former Smoker    Packs/day: 0.25    Years: 1.00    Pack years: 0.25    Types: Cigarettes    Quit date: 05/15/1968    Years  since quitting: 51.0  . Smokeless tobacco: Never Used  . Tobacco comment: smoked [place], up to 1-2 cigarettes/ day  Substance Use Topics  . Alcohol use: No    Alcohol/week: 0.0 standard drinks  . Drug use: No    Home Medications Prior to Admission medications   Medication Sig Start Date End Date Taking? Authorizing Provider  Accu-Chek FastClix Lancets MISC Use Accu Chek fastclix lancets to check blood sugar twice daily. DX:E11.65 05/07/19   Elayne Snare, MD  albuterol (VENTOLIN HFA) 108 (90 Base) MCG/ACT inhaler Inhale 2 puffs into the lungs every 4 (four) hours as needed for wheezing or shortness of breath. 03/01/19   Tower, Wynelle Fanny, MD  amLODipine (NORVASC) 10 MG tablet Take 1 tablet (10 mg total) by mouth daily. 05/25/19   [name], MD  aspirin EC 81 MG tablet Take 1 tablet (81 mg total) by mouth daily. 01/23/17   [name], MD  Blood Glucose Monitoring Suppl (ACCU-CHEK GUIDE ME) w/Device KIT 1 each by Does not apply route 2 (two) times daily. Use Accu Chek Guide Me device to check blood sugar twice daily.    [provider]  cloNIDine (CATAPRES) 0.3 MG tablet TAKE 1 TABLET TWICE DAILY 01/10/19   [name], MD  colchicine (COLCRYS) 0.6 MG tablet Take 2 tabs once and then one tab one hour later as needed for gout 07/23/18   [name], MD  furosemide (LASIX) 20 MG tablet Take 2 pills x 1 day, then 1 pill daily in the morning Patient taking differently: Take 20 mg by mouth 2 (two) times daily. Take 1 tablet by mouth twice daily. 03/05/19   [name], MD  glimepiride (AMARYL) 1 MG tablet Take 1/2 tab at bedtime 05/07/19   Elayne Snare, MD  glucose blood (ACCU-CHEK GUIDE) test strip Use Accu Chek guide test strips as instructed to check blood sugar twice daily. DX:E11.65 05/20/19   Elayne Snare, MD  hydrALAZINE (APRESOLINE) 50 MG tablet Take 1 tablet (50 mg total) by mouth 3 (three) times daily. 07/23/18   [name], MD  isosorbide mononitrate (IMDUR) 60 MG 24  hr tablet TAKE 1 TABLET TWICE DAILY 10/23/18   Burns, [name], MD  losartan (COZAAR) 100 MG tablet TAKE 1 TABLET (100 MG TOTAL) BY MOUTH DAILY. 01/10/19   [name], MD  metFORMIN (GLUCOPHAGE-XR) 750 MG 24 hr tablet Take 1 tablet (750 mg total) by mouth 2 (two) times daily. Take 1 tablet by mouth twice daily. 01/02/19   Elayne Snare, MD  metoprolol succinate (TOPROL-XL) 100 MG 24 hr tablet Take 1 tablet (100 mg total) by mouth daily. Take with or immediately following a meal. 04/15/19 07/14/19  Jerline Pain, MD  oxybutynin (DITROPAN-XL) 10 MG 24 hr tablet Take 10 mg  by mouth as needed. 05/22/18   [provider]    Allergies    Hydrochlorothiazide and Allopurinol  Review of Systems   Review of Systems  Constitutional: Positive for chills and fever.       Positive for loss of taste/smell.   Eyes: Positive for visual disturbance.  Respiratory: Positive for cough. Negative for shortness of breath.   Cardiovascular: Negative for chest pain.  Gastrointestinal: Positive for diarrhea. Negative for abdominal pain, blood in stool, constipation and vomiting.  Genitourinary: Positive for dysuria. Negative for scrotal swelling and testicular pain.  Neurological: Positive for dizziness, light-headedness and headaches. Negative for syncope, weakness and numbness.  All other systems reviewed and are negative.   Physical Exam Updated Vital Signs BP (!) 185/83 (BP Location: Right Arm)   Pulse 67   Temp 98 F (36.7 C) (Oral)   Resp (!) 28   Ht _0  (1.676 m)   Wt 83.9 kg   SpO2 95%   BMI 29.86 kg/m   Physical Exam Vitals and nursing note reviewed.  Constitutional:      Appearance: He is ill-appearing.     Comments: Hot to the touch.   HENT:     Head: Normocephalic and atraumatic.     Right Ear: Tympanic membrane is not erythematous, retracted or bulging.     Left Ear: Tympanic membrane is not erythematous, retracted or bulging.     Ears:     Comments: Non obstructing cerumen  present in R EAC.     Nose: Nose normal.     Mouth/Throat:     Pharynx: Uvula midline. No oropharyngeal exudate or posterior oropharyngeal erythema.  Eyes:     General: Vision grossly intact.        Right eye: No discharge.        Left eye: No discharge.     Extraocular Movements: Extraocular movements intact.     Conjunctiva/sclera:     Right eye: Right conjunctiva is not injected.     Left eye: Left conjunctiva is not injected.     Pupils: Pupils are equal, round, and reactive to light.  Cardiovascular:     Rate and Rhythm: Normal rate and regular rhythm.     Pulses:          Radial pulses are 2+ on the right side and 2+ on the left side.  Pulmonary:     Effort: Tachypnea present.     Breath sounds: No wheezing, rhonchi or rales.     Comments: SpO2 90-95% on RA, desaturated to 88% when transitioned from bed to standing to evaluate gait.  Abdominal:     General: There is no distension.     Palpations: Abdomen is soft.     Tenderness: There is no guarding or rebound.  Musculoskeletal:     Cervical back: Neck supple. No rigidity.     Right lower leg: No edema.     Left lower leg: No edema.  Skin:    General: Skin is warm and dry.  Neurological:     Mental Status: He is alert and oriented to person, place, and time.     Comments: Alert. Clear speech with stutter. CN III-XII Grossly intact. Sensation grossly intact x 4. 5/5 symmetric grip strength & strength with plantar/dorsiflexion bilaterally. Negative pronator drift. Some discoordination with finger to nose bilaterally. Unsteady with transition from sitting to standing, off balance with a few steps, limited evaluation, degree of ataxia noted.   Psychiatric:  Mood and Affect: Mood normal.        Behavior: Behavior normal.    ED Results / Procedures / Treatments   Labs (all labs ordered are listed, but only abnormal results are displayed) Labs Reviewed - No data to display  EKG EKG  Interpretation  Date/Time:  Thursday June 05 2019 04:46:47 EST Ventricular Rate:  65 PR Interval:    QRS Duration: 108 QT Interval:  420 QTC Calculation: 437 R Axis:   -66 Text Interpretation: Sinus rhythm Prolonged PR interval Left anterior fascicular block Probable left ventricular hypertrophy Anterior Q waves, possibly due to LVH Confirmed by Randal Buba, April ([id number]) on 06/05/2019 5:06:36 AM   Radiology DG Chest Port 1 View  Result Date: 06/05/2019 CLINICAL DATA:  Fever. Dizziness. EXAM: PORTABLE CHEST 1 VIEW COMPARISON:  03/05/2019 FINDINGS: Lower most aspect of the left chest not entirely included in the field of view. The heart is enlarged, similar to prior exam allowing for differences in technique. Unchanged mediastinal contours. Patchy bilateral airspace disease both mid lung zones and right infrahilar region. Left basilar evaluation slightly limited by overlapping soft tissue. No pneumothorax. Limited assessment for pleural effusion. IMPRESSION: 1. Patchy bilateral airspace disease suspicious for pneumonia. Pulmonary edema is felt less likely. 2. Stable cardiomegaly. Electronically Signed   By: Keith Rake M.D.   On: 06/05/2019 05:42    Procedures .Critical Care Performed by: Amaryllis Dyke, PA-C Authorized by: Amaryllis Dyke, PA-C    CRITICAL CARE Performed by: Kennith Maes   Total critical care time: 35 minutes  Critical care time was exclusive of separately billable procedures and treating other patients.  Critical care was necessary to treat or prevent imminent or life-threatening deterioration.  Critical care was time spent personally by me on the following activities: development of treatment plan with patient and/or surrogate as well as nursing, discussions with consultants, evaluation of patient's response to treatment, examination of patient, obtaining history from patient or surrogate, ordering and performing treatments and  interventions, ordering and review of laboratory studies, ordering and review of radiographic studies, pulse oximetry and re-evaluation of patient's condition.    (including critical care time)  Medications Ordered in ED Medications  ceFEPIme (MAXIPIME) 2 g in sodium chloride 0.9 % 100 mL IVPB (has no administration in time range)  metroNIDAZOLE (FLAGYL) IVPB 500 mg (has no administration in time range)  acetaminophen (TYLENOL) tablet 650 mg (has no administration in time range)  vancomycin (VANCOREADY) IVPB 1500 mg/300 mL (has no administration in time range)    ED Course  I have reviewed the triage vital signs and the nursing notes.  Pertinent labs & imaging results that were available during my care of the patient were reviewed by me and considered in my medical decision making (see chart for details).    Corey [name] was evaluated in Emergency Department on 06/05/2019 for the symptoms described in the history of present illness. He/she was evaluated in the context of the global COVID-19 pandemic, which necessitated consideration that the patient might be at risk for infection with the SARS-CoV-2 virus that causes COVID-19. Institutional protocols and algorithms that pertain to the evaluation of patients at risk for COVID-19 are in a state of rapid change based on information released by regulatory bodies including the CDC and federal and state organizations. These policies and algorithms were followed during the patient's care in the ED.  MDM Rules/Calculators/A&P  Patient presents to the ED with complaints of generally not feeling well with subjective fever, chills, loss of taste/smell, cough & loose stools, also mentions dysuria for past couple of weeks, and has had dizziness w/ gait difficulty x 1 month.  Initially documented as afebrile but felt hot to the touch--> repeat temp 103, noted to be tachypneic--> code sepsis, broad spectrum abx, given hypertensive &  suspicion for covid hold off on 30 cc/kg bolus will continue to monitor BP & lactic acid. Rapid covid testing ordered. Regarding neuro sxs--> some discoordination w/ finger to nose & difficulty with sitting to standing transition with degree of ataxia noted- sxs x 1 month- outside of code stroke window, anticipate admission and will likely benefit from imaging during stay.   EKG: No STEMI CXR: 1. Patchy bilateral airspace disease suspicious for pneumonia. Pulmonary edema is felt less likely. 2. Stable cardiomegaly CBC: Leukopenia. Anemia similar to prior. Thrombocytopenia which is new.  CMP: Worsening renal function w/ creatinine 1.95, BUN 26 prior 1.43, 26 respectively. Mildly elevated AST. No significant electrolyte derangement.  Coags: WNL Lactic acid WNL  Rapid COVID: Positive  --> add on covid inflammatory markers, will give decadron, consult to hospitalist service for admission. Per nursing patient desaturated @ rest on RA, improved w/ 1L via [place].  Findings and plan of care discussed with supervising physician Dr. Randal Buba who has evaluated the patient & is in agreement.   Patient care signed out to Domenic Moras PA-C @ change of shift pending consultation for admission.   Final Clinical Impression(s) / ED Diagnoses Final diagnoses:  COVID-19  Acute hypoxemic respiratory failure [facility])  Dizziness    Rx / DC Orders ED Discharge Orders    None       Amaryllis Dyke, PA-C 06/05/19 [place], April, MD 06/05/19 [phone]

## 2019-06-05 NOTE — ED Notes (Signed)
MD was notified of current readout of COVID test

## 2019-06-05 NOTE — ED Notes (Signed)
Lunch Tray Ordered @ 1033. 

## 2019-06-06 DIAGNOSIS — E1122 Type 2 diabetes mellitus with diabetic chronic kidney disease: Secondary | ICD-10-CM

## 2019-06-06 DIAGNOSIS — Z794 Long term (current) use of insulin: Secondary | ICD-10-CM

## 2019-06-06 DIAGNOSIS — N1832 Chronic kidney disease, stage 3b: Secondary | ICD-10-CM

## 2019-06-06 DIAGNOSIS — I10 Essential (primary) hypertension: Secondary | ICD-10-CM

## 2019-06-06 DIAGNOSIS — C61 Malignant neoplasm of prostate: Secondary | ICD-10-CM

## 2019-06-06 LAB — COMPREHENSIVE METABOLIC PANEL
ALT: 32 U/L (ref 0–44)
AST: 65 U/L — ABNORMAL HIGH (ref 15–41)
Albumin: 3 g/dL — ABNORMAL LOW (ref 3.5–5.0)
Alkaline Phosphatase: 52 U/L (ref 38–126)
Anion gap: 8 (ref 5–15)
BUN: 37 mg/dL — ABNORMAL HIGH (ref 8–23)
CO2: 22 mmol/L (ref 22–32)
Calcium: 9.3 mg/dL (ref 8.9–10.3)
Chloride: 109 mmol/L (ref 98–111)
Creatinine, Ser: 1.71 mg/dL — ABNORMAL HIGH (ref 0.61–1.24)
GFR calc Af Amer: 46 mL/min — ABNORMAL LOW (ref >60)
GFR calc non Af Amer: 39 mL/min — ABNORMAL LOW (ref >60)
Glucose, Bld: 205 mg/dL — ABNORMAL HIGH (ref 70–99)
Potassium: 4.9 mmol/L (ref 3.5–5.1)
Sodium: 139 mmol/L (ref 135–145)
Total Bilirubin: 0.4 mg/dL (ref 0.3–1.2)
Total Protein: 6 g/dL — ABNORMAL LOW (ref 6.5–8.1)

## 2019-06-06 LAB — CBC WITH DIFFERENTIAL/PLATELET
Abs Immature Granulocytes: 0.02 10*3/uL (ref 0.00–0.07)
Basophils Absolute: 0 10*3/uL (ref 0.0–0.1)
Basophils Relative: 0 %
Eosinophils Absolute: 0 10*3/uL (ref 0.0–0.5)
Eosinophils Relative: 0 %
HCT: 41.1 % (ref 39.0–52.0)
Hemoglobin: 13.1 g/dL (ref 13.0–17.0)
Immature Granulocytes: 1 %
Lymphocytes Relative: 22 %
Lymphs Abs: 0.4 10*3/uL — ABNORMAL LOW (ref 0.7–4.0)
MCH: 26.3 pg (ref 26.0–34.0)
MCHC: 31.9 g/dL (ref 30.0–36.0)
MCV: 82.5 fL (ref 80.0–100.0)
Monocytes Absolute: 0.3 10*3/uL (ref 0.1–1.0)
Monocytes Relative: 19 %
Neutro Abs: 1 10*3/uL — ABNORMAL LOW (ref 1.7–7.7)
Neutrophils Relative %: 58 %
Platelets: 136 10*3/uL — ABNORMAL LOW (ref 150–400)
RBC: 4.98 MIL/uL (ref 4.22–5.81)
RDW: 13.1 % (ref 11.5–15.5)
WBC: 1.7 10*3/uL — ABNORMAL LOW (ref 4.0–10.5)
nRBC: 0 % (ref 0.0–0.2)

## 2019-06-06 LAB — URINE CULTURE

## 2019-06-06 LAB — FERRITIN: Ferritin: 1021 ng/mL — ABNORMAL HIGH (ref 24–336)

## 2019-06-06 LAB — D-DIMER, QUANTITATIVE: D-Dimer, Quant: 0.88 ug/mL-FEU — ABNORMAL HIGH (ref 0.00–0.50)

## 2019-06-06 LAB — GLUCOSE, CAPILLARY
Glucose-Capillary: 181 mg/dL — ABNORMAL HIGH (ref 70–99)
Glucose-Capillary: 308 mg/dL — ABNORMAL HIGH (ref 70–99)
Glucose-Capillary: 367 mg/dL — ABNORMAL HIGH (ref 70–99)
Glucose-Capillary: 178 mg/dL — ABNORMAL HIGH (ref 70–99)

## 2019-06-06 LAB — TSH: TSH: 0.243 u[IU]/mL — ABNORMAL LOW (ref 0.350–4.500)

## 2019-06-06 LAB — MAGNESIUM: Magnesium: 1.9 mg/dL (ref 1.7–2.4)

## 2019-06-06 LAB — T4, FREE: Free T4: 0.82 ng/dL (ref 0.61–1.12)

## 2019-06-06 LAB — C-REACTIVE PROTEIN: CRP: 3.8 mg/dL — ABNORMAL HIGH (ref <1.0)

## 2019-06-06 LAB — PHOSPHORUS: Phosphorus: 3.5 mg/dL (ref 2.5–4.6)

## 2019-06-06 NOTE — Progress Notes (Signed)
Inpatient Diabetes Program Recommendations  AACE/ADA: New Consensus Statement on Inpatient Glycemic Control (2015)  Target Ranges:  Prepandial:   less than 140 mg/dL      Peak postprandial:   less than 180 mg/dL (1-2 hours)      Critically ill patients:  140 - 180 mg/dL   Lab Results  Component Value Date   GLUCAP 181 (H) 06/06/2019   HGBA1C 7.5 (H) 04/30/2019    Review of Glycemic Control Results for Corey [name], Corey [name] (MRN [id number]) as of 06/06/2019 13:07  Ref. Range 06/05/2019 08:36 06/05/2019 17:00 06/05/2019 21:03 06/06/2019 08:04  Glucose-Capillary Latest Ref Range: 70 - 99 mg/dL 134 (H) 321 (H) 267 (H) 181 (H)   Diabetes history: DM 2 Outpatient Diabetes medications:  Amaryl 0.5 mg daily, Metformin XR 750 mg bid Current orders for Inpatient glycemic control:  Novolog sensitive tid with meals and HS Inpatient Diabetes Program Recommendations:     Consider use of COVID 19 glycemic control order set and add: -Levemir 8 units bid -Novolog 4 units tid with meals -Tradjenta 5 mg daily  Thank s Adah Perl, RN, BC-ADM Inpatient Diabetes Coordinator Pager [phone] (8a-5p)

## 2019-06-06 NOTE — Progress Notes (Signed)
Notified son, Randall Hiss that patient has moved to 68 and updated son on patient's progress this shift. [place]

## 2019-06-06 NOTE — Evaluation (Signed)
Physical Therapy Evaluation/Vestibular Assessment Patient Details Name: Corey [name] MRN: [id number] DOB: Apr 05, 1946 Today's Date: 06/06/2019   History of Present Illness  74 y.o. mlae admitted on 06/05/19 for dizziness.  Dx with COVID 19 PNA initially requiring 2 L O2 [place].  Pt with significant PMH of seizures, renal insufficiency, prostate CA, HTN, gout, DM, CVA, bil cataract with failed L cataract surgery, h/o PEG and trach (seems to coorelate with the stroke).   Clinical Impression  Pt mildly unsteady on his feet and despite DOE 2/4 he was 94% on RA during gait.  He walked the entire unit with min hand held assist for balance.  Vestibular testing did not reveal anything significant and he has h/0 peripheral neuropathy with his DM.  He would benefit from some balance therapy after d/c at home to reduce his risk of falls.   PT to follow acutely for deficits listed below.      Follow Up Recommendations Home health PT    Equipment Recommendations  None recommended by PT    Recommendations for Other Services   NA    Precautions / Restrictions Precautions Precautions: Fall Precaution Comments: unsteady on his feet      Mobility  Bed Mobility Overal bed mobility: Modified Independent             General bed mobility comments: needs extra time and a rail  Transfers Overall transfer level: Needs assistance   Transfers: Sit to/from Stand Sit to Stand: Min guard         General transfer comment: Min guard steadying assist to get up.   Ambulation/Gait Ambulation/Gait assistance: Min assist Gait Distance (Feet): 300 Feet Assistive device: 1 person hand held assist Gait Pattern/deviations: Step-through pattern;Staggering left;Staggering right     General Gait Details: Pt with staggering gait pattern and 2/4 DOE.  Despite DOE, pt's O2 sats 94% on RA.  Pt needed min hand held assist to keep balance without AD in the hallway.  He says he has a cane at home, will try cane next  session         Balance Overall balance assessment: Needs assistance Sitting-balance support: Feet supported;No upper extremity supported Sitting balance-Leahy Scale: Good     Standing balance support: No upper extremity supported Standing balance-Leahy Scale: Poor Standing balance comment: needs external support in standing for balance.                              Pertinent Vitals/Pain Pain Assessment: No/denies pain    Home Living Family/patient expects to be discharged to:: Private residence Living Arrangements: Spouse/significant other;Children(wife and son (son works)) Available Help at Discharge: Family;Available 24 hours/day Type of Home: House Home Access: Level entry(in the back entrance it is level)     Home Layout: One level Home Equipment: Cane - single point Additional Comments: Pt does not drive and is retired.  Wife does not work and can physically help him if needed.  Son works.     Prior Function Level of Independence: Independent         Comments: reports 1 mo h/o dizziness and imbalance.      Hand Dominance   Dominant Hand: Right    Extremity/Trunk Assessment   Upper Extremity Assessment Upper Extremity Assessment: Defer to OT evaluation    Lower Extremity Assessment Lower Extremity Assessment: Generalized weakness    Cervical / Trunk Assessment Cervical / Trunk Assessment: Normal  Communication  Communication: HOH  Cognition Arousal/Alertness: Awake/alert Behavior During Therapy: WFL for tasks assessed/performed Overall Cognitive Status: No family/caregiver present to determine baseline cognitive functioning                                 General Comments: Mildly impaired, but this may be his baseline and he has h/o stroke, functional.             Assessment/Plan    PT Assessment Patient needs continued PT services  PT Problem List Decreased strength;Decreased activity tolerance;Decreased  balance;Decreased mobility;Decreased knowledge of use of DME;Cardiopulmonary status limiting activity;Impaired sensation       PT Treatment Interventions DME instruction;Gait training;Functional mobility training;Therapeutic activities;Therapeutic exercise;Balance training;Neuromuscular re-education;Patient/family education    PT Goals (Current goals can be found in the Care Plan section)  Acute Rehab PT Goals Patient Stated Goal: to get better and go home, do therapy for balance at home.  PT Goal Formulation: With patient Time For Goal Achievement: 06/20/19 Potential to Achieve Goals: Good    Frequency Min 3X/week           AM-PAC PT "6 Clicks" Mobility  Outcome Measure Help needed turning from your back to your side while in a flat bed without using bedrails?: A Little Help needed moving from lying on your back to sitting on the side of a flat bed without using bedrails?: A Little Help needed moving to and from a bed to a chair (including a wheelchair)?: A Little Help needed standing up from a chair using your arms (e.g., wheelchair or bedside chair)?: A Little Help needed to walk in hospital room?: A Little Help needed climbing 3-5 steps with a railing? : A Little 6 Click Score: 18    End of Session   Activity Tolerance: Patient limited by fatigue Patient left: in chair;with call bell/phone within reach;with chair alarm set   PT Visit Diagnosis: Muscle weakness (generalized) (M62.81);Difficulty in walking, not elsewhere classified (R26.2)    Time: 3888-7579 PT Time Calculation (min) (ACUTE ONLY): 43 min   Charges:         Verdene Lennert, PT, DPT  Acute Rehabilitation [phone] pager #[phone] office  @ [facility]: [phone]   PT Evaluation $PT Eval Moderate Complexity: 1 Mod PT Treatments $Gait Training: 8-22 mins $Therapeutic Activity: 8-22 mins   06/06/2019, 2:50 PM

## 2019-06-06 NOTE — Evaluation (Signed)
Occupational Therapy Evaluation Patient Details Name: Corey [name] MRN: [id number] DOB: May 31, 1945 Today's Date: 06/06/2019    History of Present Illness 74 y.o. mlae admitted on 06/05/19 for dizziness.  Dx with COVID 19 PNA initially requiring 2 L O2 [place].  Pt with significant PMH of seizures, renal insufficiency, prostate CA, HTN, gout, DM, CVA, bil cataract with failed L cataract surgery, h/o PEG and trach (seems to coorelate with the stroke).    Clinical Impression   PTA, pt was living with his wife and was independent. Pt currently requiring Min A for ADLs in standing and functional mobility due to poor balance. Pt motivated and agreeable to therapy. SpO2 maintaining in the 90s on RA throughout; RR elevating to high 30s. Pt would benefit from further acute OT to facilitate safe dc. Recommend dc to home with HHOT for further OT to optimize safety, independence with ADLs, and return to PLOF.      Follow Up Recommendations  Home health OT;Supervision/Assistance - 24 hour    Equipment Recommendations  3 in 1 bedside commode(As shower seat)    Recommendations for Other Services PT consult     Precautions / Restrictions Precautions Precautions: Fall Precaution Comments: Poor balance Restrictions Weight Bearing Restrictions: No      Mobility Bed Mobility Overal bed mobility: Modified Independent             General bed mobility comments: Pt in recliner upon arrival  Transfers Overall transfer level: Needs assistance   Transfers: Sit to/from Stand Sit to Stand: Min guard         General transfer comment: Min guard steadying assist to get up.     Balance Overall balance assessment: Needs assistance Sitting-balance support: Feet supported;No upper extremity supported Sitting balance-Leahy Scale: Good     Standing balance support: No upper extremity supported Standing balance-Leahy Scale: Poor Standing balance comment: needs external support in standing for balance.                             ADL either performed or assessed with clinical judgement   ADL Overall ADL's : Needs assistance/impaired Eating/Feeding: Independent;Sitting   Grooming: Min guard;Wash/dry hands;Standing   Upper Body Bathing: Set up;Supervision/ safety;Sitting   Lower Body Bathing: Min guard;Minimal assistance;Sit to/from stand   Upper Body Dressing : Supervision/safety;Set up;Sitting   Lower Body Dressing: Min guard;Minimal assistance;Sit to/from stand   Toilet Transfer: Min guard;Ambulation;BSC   Toileting- Water quality scientist and Hygiene: Supervision/safety;Set up;Sit to/from stand       Functional mobility during ADLs: Min guard;Minimal assistance General ADL Comments: Pt with decreased balance and requiring Min A for correction for LOB. As pt walked, his balance improved     Vision         Perception     Praxis      Pertinent Vitals/Pain Pain Assessment: No/denies pain     Hand Dominance Right   Extremity/Trunk Assessment Upper Extremity Assessment Upper Extremity Assessment: Overall WFL for tasks assessed   Lower Extremity Assessment Lower Extremity Assessment: Defer to PT evaluation   Cervical / Trunk Assessment Cervical / Trunk Assessment: Normal   Communication Communication Communication: HOH   Cognition Arousal/Alertness: Awake/alert Behavior During Therapy: WFL for tasks assessed/performed Overall Cognitive Status: No family/caregiver present to determine baseline cognitive functioning  General Comments: Feel pt is close to baseline cognition. h/o CVA. Following commands and very pleasant. Requiring increased time for processing   General Comments  SpO2 in 90s on RA throughout. RR in 30s during mobility.    Exercises     Shoulder Instructions      Home Living Family/patient expects to be discharged to:: Private residence Living Arrangements: Spouse/significant  other;Children(wife and son (son works)) Available Help at Discharge: Family;Available 24 hours/day Type of Home: House Home Access: Level entry(in the back entrance it is level)     Home Layout: One level     Bathroom Shower/Tub: Teacher, early years/pre: Standard     Home Equipment: Cane - single point   Additional Comments: Pt does not drive and is retired.  Wife does not work and can physically help him if needed.  Son works.       Prior Functioning/Environment Level of Independence: Independent        Comments: reports 1 mo h/o dizziness and imbalance.         OT Problem List: Decreased range of motion;Decreased strength;Decreased activity tolerance;Impaired balance (sitting and/or standing);Decreased knowledge of use of DME or AE;Cardiopulmonary status limiting activity;Decreased knowledge of precautions      OT Treatment/Interventions: Self-care/ADL training;Therapeutic exercise;Energy conservation;DME and/or AE instruction;Therapeutic activities;Patient/family education    OT Goals(Current goals can be found in the care plan section) Acute Rehab OT Goals Patient Stated Goal: to get better and go home, do therapy for balance at home.  OT Goal Formulation: With patient Time For Goal Achievement: 06/20/19 Potential to Achieve Goals: Good  OT Frequency: Min 2X/week   Barriers to D/C:            Co-evaluation              AM-PAC OT "6 Clicks" Daily Activity     Outcome Measure Help from another person eating meals?: None Help from another person taking care of personal grooming?: A Little Help from another person toileting, which includes using toliet, bedpan, or urinal?: A Little Help from another person bathing (including washing, rinsing, drying)?: A Little Help from another person to put on and taking off regular upper body clothing?: None Help from another person to put on and taking off regular lower body clothing?: A Little 6 Click  Score: 20   End of Session Nurse Communication: Mobility status  Activity Tolerance: Patient tolerated treatment well Patient left: in chair;with call bell/phone within reach  OT Visit Diagnosis: Unsteadiness on feet (R26.81);Other abnormalities of gait and mobility (R26.89);Muscle weakness (generalized) (M62.81)                Time: 5784-6962 OT Time Calculation (min): 22 min Charges:  OT General Charges $OT Visit: 1 Visit OT Evaluation $OT Eval Moderate Complexity: [place], OTR/L Acute Rehab Pager: [phone] Office: [place] 06/06/2019, 3:33 PM

## 2019-06-06 NOTE — Progress Notes (Signed)
PROGRESS NOTE  Corey [name] FWY:[id number] DOB: 11-Sep-1945 DOA: 06/05/2019 PCP: [name], MD   LOS: 1 day   Brief Narrative / Interim history: 74 year old male with history of CVA, prior seizures currently not on AED, prostate cancer, OSA, CAD, HTN, DM, presents to the hospital with dizziness.  Symptoms have been ongoing for about a month.  His son had Covid but apparently had mild illness and came out of quarantine recently.  In the ED he tested positive for Covid.  Chest x-ray on admission showed patchy bilateral airspace disease suspicion for pneumonia.  He was hypoxic requiring 2 L nasal cannula.  He was admitted to [facility].  Subjective / 24h Interval events: Complains subjectively of shortness of breath, no chest pain, no abdominal pain, no nausea or vomiting  Assessment & Plan:  Principal Problem Acute Hypoxic Respiratory Failure due to Covid-19 Viral Illness -On 2 L nasal cannula early this morning, on my evaluation he is on room air but does complain of shortness of breath. -Monitor inflammatory markers, CRP relatively stable -Patient was started on remdesivir 1/21, plan to be done 1/25 -Started on steroids 1/21 -Continue antivirals/steroids, supportive treatment, monitor inflammatory markers    COVID-19 Labs  Recent Labs    06/05/19 0457 06/06/19 0343  DDIMER 2.14* 0.88*  FERRITIN 793* 1,021*  CRP 2.0* 3.8*    Active Problems Essential hypertension -Continue Norvasc, clonidine, hydralazine, Imdur -Blood pressure stable  Acute kidney injury chronic kidney disease stage IIIb -Baseline creatinine 1.3-1.6, 1.9 on admission, improving to 1.7 this morning -Hold Cozaar  Diabetes mellitus, poorly controlled, with hyperglycemia -Continue sliding scale  CBG (last 3)  Recent Labs    06/05/19 0836 06/05/19 1700 06/05/19 2103  GLUCAP 134* 321* 267*   OSA -Hold CPAP for now  Prostate cancer -Status post radiation treatment 2017  History of  gout -No flare, hold colchicine  Scheduled Meds: . amLODipine  10 mg Oral Daily  . aspirin EC  81 mg Oral Daily  . cloNIDine  0.1 mg Oral BID  . dexamethasone (DECADRON) injection  6 mg Intravenous Q24H  . enoxaparin (LOVENOX) injection  40 mg Subcutaneous Q24H  . hydrALAZINE  50 mg Oral TID  . insulin aspart  0-5 Units Subcutaneous QHS  . insulin aspart  0-9 Units Subcutaneous TID WC  . isosorbide mononitrate  60 mg Oral BID  . sodium chloride flush  3 mL Intravenous Q12H   Continuous Infusions: . sodium chloride    . remdesivir 100 mg in NS 100 mL     PRN Meds:.sodium chloride, acetaminophen, albuterol, bisacodyl, chlorpheniramine-HYDROcodone, guaiFENesin-dextromethorphan, ondansetron **OR** ondansetron (ZOFRAN) IV, oxyCODONE, polyethylene glycol, sodium chloride flush, sodium phosphate  DVT prophylaxis: Lovenox Code Status: Full code Family Communication: will update spouse [name] [phone] later today Patient admitted from: Home Anticipated d/c place: Home Barriers to d/c: Resolution of hypoxia and finishing intravenous remdesivir  Consultants:  None  Procedures:  None  Microbiology: Urine cultures 1/21-pending Blood cultures 1/21-pending  Antimicrobials: None  Objective: Vitals:   06/05/19 1705 06/05/19 1915 06/06/19 0000 06/06/19 0407  BP:  117/68 (!) 141/80 (!) 148/114  Pulse: (!) 47 (!) 45 (!) 48 (!) 50  Resp: (!) 26 20 19 18   Temp:  97.8 F (36.6 C) 97.7 F (36.5 C) 99 F (37.2 C)  TempSrc:  Oral Oral Oral  SpO2: 99% 98% 93% 97%  Weight:      Height:        Intake/Output Summary (Last 24 hours) at  06/06/2019 0707 Last data filed at 06/06/2019 0000 Gross per 24 hour  Intake 350 ml  Output 400 ml  Net -50 ml   Filed Weights   06/05/19 0447  Weight: 83.9 kg    Examination:  Constitutional: NAD Eyes: no scleral icterus ENMT: Mucous membranes are moist.  Neck: normal, supple Respiratory: Diminished at the bases, no wheezing, no  crackles, normal respiratory effort Cardiovascular: Regular rate and rhythm, no murmurs / rubs / gallops. No LE edema. Good peripheral pulses Abdomen: non distended, no tenderness. Bowel sounds positive.  Musculoskeletal: no clubbing / cyanosis.  Skin: no rashes Neurologic: CN 2-12 grossly intact. Strength 5/5 in all 4.   Data Reviewed: I have independently reviewed following labs and imaging studies   CBC: Recent Labs  Lab 06/05/19 0457 06/06/19 0343  WBC 2.3* 1.7*  NEUTROABS 1.4* 1.0*  HGB 12.9* 13.1  HCT 41.5 41.1  MCV 85.9 82.5  PLT 105* 782*   Basic Metabolic Panel: Recent Labs  Lab 06/05/19 0457 06/06/19 0343  NA 139 139  K 4.4 4.9  CL 107 109  CO2 20* 22  GLUCOSE 143* 205*  BUN 26* 37*  CREATININE 1.95* 1.71*  CALCIUM 9.6 9.3  MG  --  1.9  PHOS  --  3.5   GFR: Estimated Creatinine Clearance: 40.2 mL/min (A) (by C-G formula based on SCr of 1.71 mg/dL (H)). Liver Function Tests: Recent Labs  Lab 06/05/19 0457 06/06/19 0343  AST 73* 65*  ALT 32 32  ALKPHOS 57 52  BILITOT 0.7 0.4  PROT 6.5 6.0*  ALBUMIN 3.4* 3.0*   No results for input(s): LIPASE, AMYLASE in the last 168 hours. No results for input(s): AMMONIA in the last 168 hours. Coagulation Profile: Recent Labs  Lab 06/05/19 0457  INR 1.0   Cardiac Enzymes: No results for input(s): CKTOTAL, CKMB, CKMBINDEX, TROPONINI in the last 168 hours. BNP (last 3 results) Recent Labs    03/05/19 1101  PROBNP 1,543.0*   HbA1C: No results for input(s): HGBA1C in the last 72 hours. CBG: Recent Labs  Lab 06/05/19 0836 06/05/19 1700 06/05/19 2103  GLUCAP 134* 321* 267*   Lipid Profile: Recent Labs    06/05/19 0628  TRIG 128   Thyroid Function Tests: Recent Labs    06/06/19 0343  TSH 0.243*   Anemia Panel: Recent Labs    06/05/19 0457 06/06/19 0343  FERRITIN 793* 1,021*   Urine analysis:    Component Value Date/Time   COLORURINE YELLOW 06/05/2019 0849   APPEARANCEUR CLEAR  06/05/2019 0849   LABSPEC 1.012 06/05/2019 0849   PHURINE 5.0 06/05/2019 0849   GLUCOSEU NEGATIVE 06/05/2019 0849   GLUCOSEU NEGATIVE 07/20/2014 1055   HGBUR MODERATE (A) 06/05/2019 0849   HGBUR negative 08/28/2008 1446   BILIRUBINUR NEGATIVE 06/05/2019 0849   BILIRUBINUR Neg 10/28/2014 0916   KETONESUR NEGATIVE 06/05/2019 0849   PROTEINUR 100 (A) 06/05/2019 0849   UROBILINOGEN 0.2 10/28/2014 0916   UROBILINOGEN 0.2 07/20/2014 1055   NITRITE NEGATIVE 06/05/2019 0849   LEUKOCYTESUR NEGATIVE 06/05/2019 0849   Sepsis Labs: Invalid input(s): PROCALCITONIN, LACTICIDVEN  No results found for this or any previous visit (from the past 240 hour(s)).    Radiology Studies: DG Chest Port 1 View  Result Date: 06/05/2019 CLINICAL DATA:  Fever. Dizziness. EXAM: PORTABLE CHEST 1 VIEW COMPARISON:  03/05/2019 FINDINGS: Lower most aspect of the left chest not entirely included in the field of view. The heart is enlarged, similar to prior exam allowing for differences in  technique. Unchanged mediastinal contours. Patchy bilateral airspace disease both mid lung zones and right infrahilar region. Left basilar evaluation slightly limited by overlapping soft tissue. No pneumothorax. Limited assessment for pleural effusion. IMPRESSION: 1. Patchy bilateral airspace disease suspicious for pneumonia. Pulmonary edema is felt less likely. 2. Stable cardiomegaly. Electronically Signed   By: Keith Rake M.D.   On: 06/05/2019 05:42     Marzetta Board, MD, PhD [facility]  Contact via  [email or web address]  [place] P: [phone] F: [phone]

## 2019-06-06 NOTE — Consult Note (Signed)
   [facility] Inpatient Consult   06/06/2019  [place] Jan 05, 1946 [id number]     [facility] Pending Status:   Patient was recently assigned for outreach to [facility]) RN CM (aware of admission), as patient/ self referral (needing help getting eye exam) prior to this admission.   Patient will receive a post hospital call and will be evaluated for assessments and disease process education if transitioning to home.  Patient's chart waschecked for 18% risk score for unplanned readmission and hospitalization under his [facility] insurance plan.  Chart review reveals, MD brief narrative as: 74 year old male with history of CVA, prior seizures currently not on AED, prostate cancer, OSA, CAD, HTN, DM,     was admitted to [facility]) for Acute Hypoxic Respiratory Failure (requiring 2 L nasal cannula) due to Covid-19 Viral Illness. He lives with son who had COVID(out of quarantine Sunday).  His primary care provider is Dr. Billey Gosling with [place] [facility], office listed as providing transition of care follow-up.  Plan:  Will follow progress and notify [facility] RN CM of patient's discharge disposition/ needs.  Will make Inpatient [facility] team aware that [facility] care management will be following.   For additional information and questions, please call:  [place] Reader A. Niurka Benecke, BSN, RN-BC [facility] Liaison Cell: [phone]

## 2019-06-07 LAB — CBC WITH DIFFERENTIAL/PLATELET
Abs Immature Granulocytes: 0.02 10*3/uL (ref 0.00–0.07)
Basophils Absolute: 0 10*3/uL (ref 0.0–0.1)
Basophils Relative: 0 %
Eosinophils Absolute: 0 10*3/uL (ref 0.0–0.5)
Eosinophils Relative: 0 %
HCT: 39.5 % (ref 39.0–52.0)
Hemoglobin: 12.9 g/dL — ABNORMAL LOW (ref 13.0–17.0)
Immature Granulocytes: 1 %
Lymphocytes Relative: 11 %
Lymphs Abs: 0.4 10*3/uL — ABNORMAL LOW (ref 0.7–4.0)
MCH: 26.4 pg (ref 26.0–34.0)
MCHC: 32.7 g/dL (ref 30.0–36.0)
MCV: 80.9 fL (ref 80.0–100.0)
Monocytes Absolute: 0.5 10*3/uL (ref 0.1–1.0)
Monocytes Relative: 12 %
Neutro Abs: 3 10*3/uL (ref 1.7–7.7)
Neutrophils Relative %: 76 %
Platelets: 171 10*3/uL (ref 150–400)
RBC: 4.88 MIL/uL (ref 4.22–5.81)
RDW: 13 % (ref 11.5–15.5)
WBC: 3.9 10*3/uL — ABNORMAL LOW (ref 4.0–10.5)
nRBC: 0 % (ref 0.0–0.2)

## 2019-06-07 LAB — COMPREHENSIVE METABOLIC PANEL
ALT: 31 U/L (ref 0–44)
AST: 56 U/L — ABNORMAL HIGH (ref 15–41)
Albumin: 3 g/dL — ABNORMAL LOW (ref 3.5–5.0)
Alkaline Phosphatase: 51 U/L (ref 38–126)
Anion gap: 7 (ref 5–15)
BUN: 45 mg/dL — ABNORMAL HIGH (ref 8–23)
CO2: 22 mmol/L (ref 22–32)
Calcium: 9.3 mg/dL (ref 8.9–10.3)
Chloride: 108 mmol/L (ref 98–111)
Creatinine, Ser: 1.55 mg/dL — ABNORMAL HIGH (ref 0.61–1.24)
GFR calc Af Amer: 51 mL/min — ABNORMAL LOW (ref >60)
GFR calc non Af Amer: 44 mL/min — ABNORMAL LOW (ref >60)
Glucose, Bld: 178 mg/dL — ABNORMAL HIGH (ref 70–99)
Potassium: 4.7 mmol/L (ref 3.5–5.1)
Sodium: 137 mmol/L (ref 135–145)
Total Bilirubin: 0.6 mg/dL (ref 0.3–1.2)
Total Protein: 6.1 g/dL — ABNORMAL LOW (ref 6.5–8.1)

## 2019-06-07 LAB — D-DIMER, QUANTITATIVE: D-Dimer, Quant: 0.82 ug/mL-FEU — ABNORMAL HIGH (ref 0.00–0.50)

## 2019-06-07 LAB — FERRITIN: Ferritin: 1044 ng/mL — ABNORMAL HIGH (ref 24–336)

## 2019-06-07 LAB — GLUCOSE, CAPILLARY
Glucose-Capillary: 160 mg/dL — ABNORMAL HIGH (ref 70–99)
Glucose-Capillary: 299 mg/dL — ABNORMAL HIGH (ref 70–99)
Glucose-Capillary: 243 mg/dL — ABNORMAL HIGH (ref 70–99)

## 2019-06-07 LAB — C-REACTIVE PROTEIN: CRP: 1.6 mg/dL — ABNORMAL HIGH (ref <1.0)

## 2019-06-07 LAB — MAGNESIUM: Magnesium: 2.2 mg/dL (ref 1.7–2.4)

## 2019-06-07 LAB — PHOSPHORUS: Phosphorus: 3.5 mg/dL (ref 2.5–4.6)

## 2019-06-07 NOTE — Plan of Care (Signed)
Covid plan of care initiated

## 2019-06-07 NOTE — Progress Notes (Signed)
PROGRESS NOTE  Corey [name] HMC:[id number] DOB: [birth year] DOA: 06/05/2019 PCP: [name], MD   LOS: 2 days   Brief Narrative / Interim history: 74 year old male with history of CVA, prior seizures currently not on AED, prostate cancer, OSA, CAD, HTN, DM, presents to the hospital with dizziness.  Symptoms have been ongoing for about a month.  His son had Covid but apparently had mild illness and came out of quarantine recently.  In the ED he tested positive for Covid.  Chest x-ray on admission showed patchy bilateral airspace disease suspicion for pneumonia.  He was hypoxic requiring 2 L nasal cannula.  He was admitted to [facility].  Subjective / 24h Interval events: Feeling better, denies any shortness of breath, no chest pain, no abdominal pain, no nausea or vomiting  Assessment & Plan:  Principal Problem Acute Hypoxic Respiratory Failure due to Covid-19 Viral Illness -Was able to be weaned off to room air. -Monitor inflammatory markers, CRP relatively stable and improving -Patient was started on remdesivir 1/21, plan to be done 1/25 -Started on steroids 1/21 -Continue antivirals/steroids, supportive treatment, monitor inflammatory markers    COVID-19 Labs  Recent Labs    06/05/19 0457 06/06/19 0343 06/07/19 0551 06/07/19 0552  DDIMER 2.14* 0.88*  --  0.82*  FERRITIN 793* 1,021* 1,044*  --   CRP 2.0* 3.8* 1.6*  --     Active Problems Essential hypertension -Continue Norvasc, clonidine, hydralazine, Imdur -Blood pressure in the 140s, overall stable  Acute kidney injury chronic kidney disease stage IIIb -Baseline creatinine 1.3-1.6, 1.9 on admission, improving to 1.5 this morning which is at baseline.  Continue to hold Cozaar  Diabetes mellitus, poorly controlled, with hyperglycemia -Continue sliding scale  CBG (last 3)  Recent Labs    06/06/19 1618 06/06/19 2112 06/07/19 0736  GLUCAP 367* 178* 160*   OSA -Hold CPAP for now  Prostate  cancer -Status post radiation treatment 2017  History of gout -No flare, hold colchicine  Scheduled Meds: . amLODipine  10 mg Oral Daily  . aspirin EC  81 mg Oral Daily  . cloNIDine  0.1 mg Oral BID  . dexamethasone (DECADRON) injection  6 mg Intravenous Q24H  . enoxaparin (LOVENOX) injection  40 mg Subcutaneous Q24H  . hydrALAZINE  50 mg Oral TID  . insulin aspart  0-5 Units Subcutaneous QHS  . insulin aspart  0-9 Units Subcutaneous TID WC  . isosorbide mononitrate  60 mg Oral BID  . sodium chloride flush  3 mL Intravenous Q12H   Continuous Infusions: . sodium chloride    . remdesivir 100 mg in NS 100 mL 100 mg (06/07/19 0826)   PRN Meds:.sodium chloride, acetaminophen, albuterol, bisacodyl, chlorpheniramine-HYDROcodone, guaiFENesin-dextromethorphan, ondansetron **OR** ondansetron (ZOFRAN) IV, oxyCODONE, polyethylene glycol, sodium chloride flush, sodium phosphate  DVT prophylaxis: Lovenox Code Status: Full code Family Communication: Discussed with patient Patient admitted from: Home Anticipated d/c place: Home Barriers to d/c: Home on Monday once remdesivir is done  Consultants:  None  Procedures:  None  Microbiology: Urine cultures 1/21-multiple species present Blood cultures 1/21-no growth 2 days  Antimicrobials: None  Objective: Vitals:   06/06/19 1947 06/06/19 2314 06/07/19 0355 06/07/19 0730  BP: (!) 149/88  (!) 145/85 (!) 164/88  Pulse: (!) 59 60 (!) 54 (!) 57  Resp: (!) 21  20 16   Temp: 98.4 F (36.9 C)  97.9 F (36.6 C) 98.5 F (36.9 C)  TempSrc: Oral  Oral Oral  SpO2: 97% 94% 98% 97%  Weight:  Height:        Intake/Output Summary (Last 24 hours) at 06/07/2019 1225 Last data filed at 06/07/2019 0600 Gross per 24 hour  Intake 700 ml  Output 1150 ml  Net -450 ml   Filed Weights   06/05/19 0447  Weight: 83.9 kg    Examination:  Constitutional: No distress, appears comfortable Eyes: No scleral icterus ENMT: Moist mucous  membranes Neck: normal, supple Respiratory: Diminished at the bases, no wheezing, no crackles, normal respiratory effort Cardiovascular: Regular rate and rhythm, no murmurs, no edema Abdomen: Soft, NT, ND, positive bowel sounds Musculoskeletal: no clubbing / cyanosis.  Skin: No rashes seen Neurologic: Nonfocal, equal strength  Data Reviewed: I have independently reviewed following labs and imaging studies   CBC: Recent Labs  Lab 06/05/19 0457 06/06/19 0343 06/07/19 0552  WBC 2.3* 1.7* 3.9*  NEUTROABS 1.4* 1.0* 3.0  HGB 12.9* 13.1 12.9*  HCT 41.5 41.1 39.5  MCV 85.9 82.5 80.9  PLT 105* 136* 423   Basic Metabolic Panel: Recent Labs  Lab 06/05/19 0457 06/06/19 0343 06/07/19 0552  NA 139 139 137  K 4.4 4.9 4.7  CL 107 109 108  CO2 20* 22 22  GLUCOSE 143* 205* 178*  BUN 26* 37* 45*  CREATININE 1.95* 1.71* 1.55*  CALCIUM 9.6 9.3 9.3  MG  --  1.9 2.2  PHOS  --  3.5 3.5   GFR: Estimated Creatinine Clearance: 44.4 mL/min (A) (by C-G formula based on SCr of 1.55 mg/dL (H)). Liver Function Tests: Recent Labs  Lab 06/05/19 0457 06/06/19 0343 06/07/19 0552  AST 73* 65* 56*  ALT 32 32 31  ALKPHOS 57 52 51  BILITOT 0.7 0.4 0.6  PROT 6.5 6.0* 6.1*  ALBUMIN 3.4* 3.0* 3.0*   No results for input(s): LIPASE, AMYLASE in the last 168 hours. No results for input(s): AMMONIA in the last 168 hours. Coagulation Profile: Recent Labs  Lab 06/05/19 0457  INR 1.0   Cardiac Enzymes: No results for input(s): CKTOTAL, CKMB, CKMBINDEX, TROPONINI in the last 168 hours. BNP (last 3 results) Recent Labs    03/05/19 1101  PROBNP 1,543.0*   HbA1C: No results for input(s): HGBA1C in the last 72 hours. CBG: Recent Labs  Lab 06/06/19 0804 06/06/19 1208 06/06/19 1618 06/06/19 2112 06/07/19 0736  GLUCAP 181* 308* 367* 178* 160*   Lipid Profile: Recent Labs    06/05/19 0628  TRIG 128   Thyroid Function Tests: Recent Labs    06/06/19 0343  TSH 0.243*  FREET4 0.82    Anemia Panel: Recent Labs    06/06/19 0343 06/07/19 0551  FERRITIN 1,021* 1,044*   Urine analysis:    Component Value Date/Time   COLORURINE YELLOW 06/05/2019 0849   APPEARANCEUR CLEAR 06/05/2019 0849   LABSPEC 1.012 06/05/2019 0849   PHURINE 5.0 06/05/2019 0849   GLUCOSEU NEGATIVE 06/05/2019 0849   GLUCOSEU NEGATIVE 07/20/2014 1055   HGBUR MODERATE (A) 06/05/2019 0849   HGBUR negative 08/28/2008 1446   BILIRUBINUR NEGATIVE 06/05/2019 0849   BILIRUBINUR Neg 10/28/2014 0916   KETONESUR NEGATIVE 06/05/2019 0849   PROTEINUR 100 (A) 06/05/2019 0849   UROBILINOGEN 0.2 10/28/2014 0916   UROBILINOGEN 0.2 07/20/2014 1055   NITRITE NEGATIVE 06/05/2019 0849   LEUKOCYTESUR NEGATIVE 06/05/2019 0849   Sepsis Labs: Invalid input(s): PROCALCITONIN, LACTICIDVEN  Recent Results (from the past 240 hour(s))  Blood Culture (routine x 2)     Status: None (Preliminary result)   Collection Time: 06/05/19  5:42 AM   Specimen: BLOOD  Result Value Ref Range Status   Specimen Description BLOOD SITE NOT SPECIFIED  Final   Special Requests   Final    BOTTLES DRAWN AEROBIC AND ANAEROBIC Blood Culture adequate volume   Culture   Final    NO GROWTH 2 DAYS Performed at [facility], [address] [place], [place] [address]    Report Status PENDING  Incomplete  Blood Culture (routine x 2)     Status: None (Preliminary result)   Collection Time: 06/05/19  5:43 AM   Specimen: BLOOD RIGHT ARM  Result Value Ref Range Status   Specimen Description BLOOD RIGHT ARM  Final   Special Requests   Final    BOTTLES DRAWN AEROBIC AND ANAEROBIC Blood Culture results may not be optimal due to an excessive volume of blood received in culture bottles   Culture   Final    NO GROWTH 2 DAYS Performed at [facility], [place] [address], [place], [place] [address]    Report Status PENDING  Incomplete  Urine culture     Status: Abnormal   Collection Time: 06/05/19  8:53 AM   Specimen: In/Out Cath Urine   Result Value Ref Range Status   Specimen Description IN/OUT CATH URINE  Final   Special Requests   Final    NONE Performed at [facility], [place] [address] [place], [place] [address]    Culture MULTIPLE SPECIES PRESENT, SUGGEST RECOLLECTION (A)  Final   Report Status 06/06/2019 FINAL  Final      Radiology Studies: No results found.   Marzetta Board, MD, PhD [facility]  Contact via  [email or web address]  [place] P: [phone] F: [phone]

## 2019-06-07 NOTE — Plan of Care (Addendum)
Patient is up to chair, no s/s of pain or distress. All medication given well tolerated. Patient ambulated in room with walker. Will continue to monitor for remainder of shift.   Problem: Education: Goal: Knowledge of General Education information will improve Description: Including pain rating scale, medication(s)/side effects and non-pharmacologic comfort measures 06/07/2019 0838 by Orvan Falconer, RN Outcome: Progressing 06/07/2019 0838 by Orvan Falconer, RN Outcome: Progressing   Problem: Health Behavior/Discharge Planning: Goal: Ability to manage health-related needs will improve 06/07/2019 0838 by Orvan Falconer, RN Outcome: Progressing 06/07/2019 0838 by Orvan Falconer, RN Outcome: Progressing   Problem: Clinical Measurements: Goal: Ability to maintain clinical measurements within normal limits will improve 06/07/2019 0838 by Orvan Falconer, RN Outcome: Progressing 06/07/2019 0838 by Orvan Falconer, RN Outcome: Progressing Goal: Will remain free from infection 06/07/2019 0838 by Orvan Falconer, RN Outcome: Progressing 06/07/2019 0838 by Orvan Falconer, RN Outcome: Progressing Goal: Diagnostic test results will improve 06/07/2019 0838 by Orvan Falconer, RN Outcome: Progressing 06/07/2019 0838 by Orvan Falconer, RN Outcome: Progressing Goal: Respiratory complications will improve 06/07/2019 0838 by Orvan Falconer, RN Outcome: Progressing 06/07/2019 0838 by Orvan Falconer, RN Outcome: Progressing Goal: Cardiovascular complication will be avoided 06/07/2019 0838 by Orvan Falconer, RN Outcome: Progressing 06/07/2019 0838 by Orvan Falconer, RN Outcome: Progressing

## 2019-06-08 LAB — PHOSPHORUS: Phosphorus: 3.2 mg/dL (ref 2.5–4.6)

## 2019-06-08 LAB — CBC WITH DIFFERENTIAL/PLATELET
Abs Immature Granulocytes: 0.03 10*3/uL (ref 0.00–0.07)
Basophils Absolute: 0 10*3/uL (ref 0.0–0.1)
Basophils Relative: 0 %
Eosinophils Absolute: 0 10*3/uL (ref 0.0–0.5)
Eosinophils Relative: 0 %
HCT: 40.9 % (ref 39.0–52.0)
Hemoglobin: 13.5 g/dL (ref 13.0–17.0)
Immature Granulocytes: 1 %
Lymphocytes Relative: 11 %
Lymphs Abs: 0.5 10*3/uL — ABNORMAL LOW (ref 0.7–4.0)
MCH: 26.6 pg (ref 26.0–34.0)
MCHC: 33 g/dL (ref 30.0–36.0)
MCV: 80.7 fL (ref 80.0–100.0)
Monocytes Absolute: 0.6 10*3/uL (ref 0.1–1.0)
Monocytes Relative: 12 %
Neutro Abs: 3.5 10*3/uL (ref 1.7–7.7)
Neutrophils Relative %: 76 %
Platelets: 206 10*3/uL (ref 150–400)
RBC: 5.07 MIL/uL (ref 4.22–5.81)
RDW: 13.2 % (ref 11.5–15.5)
WBC: 4.6 10*3/uL (ref 4.0–10.5)
nRBC: 0 % (ref 0.0–0.2)

## 2019-06-08 LAB — COMPREHENSIVE METABOLIC PANEL
ALT: 48 U/L — ABNORMAL HIGH (ref 0–44)
AST: 94 U/L — ABNORMAL HIGH (ref 15–41)
Albumin: 3 g/dL — ABNORMAL LOW (ref 3.5–5.0)
Alkaline Phosphatase: 50 U/L (ref 38–126)
Anion gap: 8 (ref 5–15)
BUN: 46 mg/dL — ABNORMAL HIGH (ref 8–23)
CO2: 22 mmol/L (ref 22–32)
Calcium: 9.4 mg/dL (ref 8.9–10.3)
Chloride: 109 mmol/L (ref 98–111)
Creatinine, Ser: 1.42 mg/dL — ABNORMAL HIGH (ref 0.61–1.24)
GFR calc Af Amer: 57 mL/min — ABNORMAL LOW (ref >60)
GFR calc non Af Amer: 49 mL/min — ABNORMAL LOW (ref >60)
Glucose, Bld: 101 mg/dL — ABNORMAL HIGH (ref 70–99)
Potassium: 4.8 mmol/L (ref 3.5–5.1)
Sodium: 139 mmol/L (ref 135–145)
Total Bilirubin: 0.8 mg/dL (ref 0.3–1.2)
Total Protein: 6.1 g/dL — ABNORMAL LOW (ref 6.5–8.1)

## 2019-06-08 LAB — GLUCOSE, CAPILLARY
Glucose-Capillary: 288 mg/dL — ABNORMAL HIGH (ref 70–99)
Glucose-Capillary: 138 mg/dL — ABNORMAL HIGH (ref 70–99)
Glucose-Capillary: 274 mg/dL — ABNORMAL HIGH (ref 70–99)
Glucose-Capillary: 263 mg/dL — ABNORMAL HIGH (ref 70–99)
Glucose-Capillary: 242 mg/dL — ABNORMAL HIGH (ref 70–99)

## 2019-06-08 LAB — C-REACTIVE PROTEIN: CRP: 0.8 mg/dL (ref <1.0)

## 2019-06-08 LAB — D-DIMER, QUANTITATIVE: D-Dimer, Quant: 0.67 ug/mL-FEU — ABNORMAL HIGH (ref 0.00–0.50)

## 2019-06-08 LAB — FERRITIN: Ferritin: 1101 ng/mL — ABNORMAL HIGH (ref 24–336)

## 2019-06-08 LAB — MAGNESIUM: Magnesium: 2.2 mg/dL (ref 1.7–2.4)

## 2019-06-08 NOTE — Progress Notes (Signed)
PROGRESS NOTE  Corey [name] DDU:[id number] DOB: 1945/09/10 DOA: 06/05/2019 PCP: [name], MD   LOS: 3 days   Brief Narrative / Interim history: 74 year old male with history of CVA, prior seizures currently not on AED, prostate cancer, OSA, CAD, HTN, DM, presents to the hospital with dizziness.  Symptoms have been ongoing for about a month.  His son had Covid but apparently had mild illness and came out of quarantine recently.  In the ED he tested positive for Covid.  Chest x-ray on admission showed patchy bilateral airspace disease suspicion for pneumonia.  He was hypoxic requiring 2 L nasal cannula.  He was admitted to [facility].  Subjective / 24h Interval events: Feeling well, no complaints.  No shortness of breath, no chest pain, no nausea or vomiting  Assessment & Plan:  Principal Problem Acute Hypoxic Respiratory Failure due to Covid-19 Viral Illness -Was able to be weaned off to room air. -Monitor inflammatory markers, CRP relatively stable and improving -Patient was started on remdesivir 1/21, plan to be done 1/25, anticipate home discharge tomorrow -Started on steroids 1/21 -Continue antivirals/steroids, supportive treatment, monitor inflammatory markers    COVID-19 Labs  Recent Labs    06/06/19 0343 06/07/19 0551 06/07/19 0552 06/08/19 0645  DDIMER 0.88*  --  0.82* 0.67*  FERRITIN 1,021* 1,044*  --   --   CRP 3.8* 1.6*  --   --     Active Problems Essential hypertension -Continue Norvasc, clonidine, hydralazine, Imdur -Blood pressure in the 140s, overall stable  Acute kidney injury chronic kidney disease stage IIIb -Baseline creatinine 1.3-1.6, 1.9 on admission, improving and now at baseline.  Continue to hold Cozaar  Diabetes mellitus, poorly controlled, with hyperglycemia -Continue sliding scale  CBG (last 3)  Recent Labs    06/07/19 1628 06/07/19 1949 06/08/19 0738  GLUCAP 299* 243* 138*   OSA -Hold CPAP for now  Prostate  cancer -Status post radiation treatment 2017  History of gout -No flare, hold colchicine  Scheduled Meds: . amLODipine  10 mg Oral Daily  . aspirin EC  81 mg Oral Daily  . cloNIDine  0.1 mg Oral BID  . dexamethasone (DECADRON) injection  6 mg Intravenous Q24H  . enoxaparin (LOVENOX) injection  40 mg Subcutaneous Q24H  . hydrALAZINE  50 mg Oral TID  . insulin aspart  0-5 Units Subcutaneous QHS  . insulin aspart  0-9 Units Subcutaneous TID WC  . isosorbide mononitrate  60 mg Oral BID  . sodium chloride flush  3 mL Intravenous Q12H   Continuous Infusions: . sodium chloride    . remdesivir 100 mg in NS 100 mL 100 mg (06/08/19 0823)   PRN Meds:.sodium chloride, acetaminophen, albuterol, bisacodyl, chlorpheniramine-HYDROcodone, guaiFENesin-dextromethorphan, ondansetron **OR** ondansetron (ZOFRAN) IV, oxyCODONE, polyethylene glycol, sodium chloride flush, sodium phosphate  DVT prophylaxis: Lovenox Code Status: Full code Family Communication: Discussed with patient, updated wife [name] over the phone Patient admitted from: Home Anticipated d/c place: Home Barriers to d/c: Home tomorrow  Consultants:  None  Procedures:  None  Microbiology: Urine cultures 1/21-multiple species present Blood cultures 1/21-no growth 2 days  Antimicrobials: None  Objective: Vitals:   06/07/19 1949 06/07/19 2000 06/08/19 0614 06/08/19 0730  BP: (!) 157/99 (!) 149/84 (!) 140/95 (!) 143/88  Pulse:  (!) 54 (!) 52 (!) 57  Resp:  20 19   Temp:  98 F (36.7 C) 98.2 F (36.8 C) 98.9 F (37.2 C)  TempSrc:  Oral Oral Oral  SpO2:  98%  98% 96%  Weight:      Height:        Intake/Output Summary (Last 24 hours) at 06/08/2019 1111 Last data filed at 06/08/2019 0500 Gross per 24 hour  Intake --  Output 500 ml  Net -500 ml   Filed Weights   06/05/19 0447  Weight: 83.9 kg    Examination:  Constitutional: NAD, in chair Eyes: No icterus ENMT: mmm Neck: normal, supple Respiratory: Clear,  no wheezing, no crackles Cardiovascular: Regular rate and rhythm, no murmurs Abdomen: Soft, nontender, nondistended, positive bowel sounds Musculoskeletal: no clubbing / cyanosis.  Skin: No rash seen Neurologic: No focal deficits  Data Reviewed: I have independently reviewed following labs and imaging studies   CBC: Recent Labs  Lab 06/05/19 0457 06/06/19 0343 06/07/19 0552 06/08/19 0645  WBC 2.3* 1.7* 3.9* 4.6  NEUTROABS 1.4* 1.0* 3.0 3.5  HGB 12.9* 13.1 12.9* 13.5  HCT 41.5 41.1 39.5 40.9  MCV 85.9 82.5 80.9 80.7  PLT 105* 136* 171 789   Basic Metabolic Panel: Recent Labs  Lab 06/05/19 0457 06/06/19 0343 06/07/19 0552 06/08/19 0645  NA 139 139 137 139  K 4.4 4.9 4.7 4.8  CL 107 109 108 109  CO2 20* 22 22 22   GLUCOSE 143* 205* 178* 101*  BUN 26* 37* 45* 46*  CREATININE 1.95* 1.71* 1.55* 1.42*  CALCIUM 9.6 9.3 9.3 9.4  MG  --  1.9 2.2 2.2  PHOS  --  3.5 3.5 3.2   GFR: Estimated Creatinine Clearance: 48.5 mL/min (A) (by C-G formula based on SCr of 1.42 mg/dL (H)). Liver Function Tests: Recent Labs  Lab 06/05/19 0457 06/06/19 0343 06/07/19 0552 06/08/19 0645  AST 73* 65* 56* 94*  ALT 32 32 31 48*  ALKPHOS 57 52 51 50  BILITOT 0.7 0.4 0.6 0.8  PROT 6.5 6.0* 6.1* 6.1*  ALBUMIN 3.4* 3.0* 3.0* 3.0*   No results for input(s): LIPASE, AMYLASE in the last 168 hours. No results for input(s): AMMONIA in the last 168 hours. Coagulation Profile: Recent Labs  Lab 06/05/19 0457  INR 1.0   Cardiac Enzymes: No results for input(s): CKTOTAL, CKMB, CKMBINDEX, TROPONINI in the last 168 hours. BNP (last 3 results) Recent Labs    03/05/19 1101  PROBNP 1,543.0*   HbA1C: No results for input(s): HGBA1C in the last 72 hours. CBG: Recent Labs  Lab 06/06/19 2112 06/07/19 0736 06/07/19 1628 06/07/19 1949 06/08/19 0738  GLUCAP 178* 160* 299* 243* 138*   Lipid Profile: No results for input(s): CHOL, HDL, LDLCALC, TRIG, CHOLHDL, LDLDIRECT in the last 72  hours. Thyroid Function Tests: Recent Labs    06/06/19 0343  TSH 0.243*  FREET4 0.82   Anemia Panel: Recent Labs    06/06/19 0343 06/07/19 0551  FERRITIN 1,021* 1,044*   Urine analysis:    Component Value Date/Time   COLORURINE YELLOW 06/05/2019 0849   APPEARANCEUR CLEAR 06/05/2019 0849   LABSPEC 1.012 06/05/2019 0849   PHURINE 5.0 06/05/2019 0849   GLUCOSEU NEGATIVE 06/05/2019 0849   GLUCOSEU NEGATIVE 07/20/2014 1055   HGBUR MODERATE (A) 06/05/2019 0849   HGBUR negative 08/28/2008 1446   BILIRUBINUR NEGATIVE 06/05/2019 0849   BILIRUBINUR Neg 10/28/2014 0916   KETONESUR NEGATIVE 06/05/2019 0849   PROTEINUR 100 (A) 06/05/2019 0849   UROBILINOGEN 0.2 10/28/2014 0916   UROBILINOGEN 0.2 07/20/2014 1055   NITRITE NEGATIVE 06/05/2019 0849   LEUKOCYTESUR NEGATIVE 06/05/2019 0849   Sepsis Labs: Invalid input(s): PROCALCITONIN, LACTICIDVEN  Recent Results (from the past 240  hour(s))  Blood Culture (routine x 2)     Status: None (Preliminary result)   Collection Time: 06/05/19  5:42 AM   Specimen: BLOOD  Result Value Ref Range Status   Specimen Description BLOOD SITE NOT SPECIFIED  Final   Special Requests   Final    BOTTLES DRAWN AEROBIC AND ANAEROBIC Blood Culture adequate volume   Culture   Final    NO GROWTH 3 DAYS Performed at [facility], [address] [place], [place] [address]    Report Status PENDING  Incomplete  Blood Culture (routine x 2)     Status: None (Preliminary result)   Collection Time: 06/05/19  5:43 AM   Specimen: BLOOD RIGHT ARM  Result Value Ref Range Status   Specimen Description BLOOD RIGHT ARM  Final   Special Requests   Final    BOTTLES DRAWN AEROBIC AND ANAEROBIC Blood Culture results may not be optimal due to an excessive volume of blood received in culture bottles   Culture   Final    NO GROWTH 3 DAYS Performed at [facility], [place] [address] [place], [place] [address]    Report Status PENDING  Incomplete  Urine  culture     Status: Abnormal   Collection Time: 06/05/19  8:53 AM   Specimen: In/Out Cath Urine  Result Value Ref Range Status   Specimen Description IN/OUT CATH URINE  Final   Special Requests   Final    NONE Performed at [facility], [place] [address] [place], [place] [address]    Culture MULTIPLE SPECIES PRESENT, SUGGEST RECOLLECTION (A)  Final   Report Status 06/06/2019 FINAL  Final      Radiology Studies: No results found.   Marzetta Board, MD, PhD [facility]  Contact via  [email or web address]  [place] P: [phone] F: [phone]

## 2019-06-08 NOTE — Plan of Care (Addendum)
Patient up to chair majority of the day, all medication given well tolerated. No s/s of pain or distress. Patient stated updated family. Will continue to monitor for remainder of shift.  Problem: Education: Goal: Knowledge of General Education information will improve Description: Including pain rating scale, medication(s)/side effects and non-pharmacologic comfort measures 06/08/2019 0919 by Orvan Falconer, RN Outcome: Progressing 06/08/2019 0919 by Orvan Falconer, RN Outcome: Progressing   Problem: Health Behavior/Discharge Planning: Goal: Ability to manage health-related needs will improve 06/08/2019 0919 by Orvan Falconer, RN Outcome: Progressing 06/08/2019 0919 by Orvan Falconer, RN Outcome: Progressing   Problem: Clinical Measurements: Goal: Ability to maintain clinical measurements within normal limits will improve 06/08/2019 0919 by Orvan Falconer, RN Outcome: Progressing 06/08/2019 0919 by Orvan Falconer, RN Outcome: Progressing Goal: Will remain free from infection 06/08/2019 0919 by Orvan Falconer, RN Outcome: Progressing 06/08/2019 0919 by Orvan Falconer, RN Outcome: Progressing Goal: Diagnostic test results will improve 06/08/2019 0919 by Orvan Falconer, RN Outcome: Progressing 06/08/2019 0919 by Orvan Falconer, RN Outcome: Progressing Goal: Respiratory complications will improve 06/08/2019 0919 by Orvan Falconer, RN Outcome: Progressing 06/08/2019 0919 by Orvan Falconer, RN Outcome: Progressing Goal: Cardiovascular complication will be avoided 06/08/2019 0919 by Orvan Falconer, RN Outcome: Progressing 06/08/2019 0919 by Orvan Falconer, RN Outcome: Progressing   Problem: Education: Goal: Knowledge of risk factors and measures for prevention of condition will improve 06/08/2019 0919 by Orvan Falconer, RN Outcome: Progressing 06/08/2019 0919 by Orvan Falconer, RN Outcome: Progressing   Problem: Coping: Goal:  Psychosocial and spiritual needs will be supported 06/08/2019 0919 by Orvan Falconer, RN Outcome: Progressing 06/08/2019 0919 by Orvan Falconer, RN Outcome: Progressing   Problem: Respiratory: Goal: Will maintain a patent airway 06/08/2019 0919 by Orvan Falconer, RN Outcome: Progressing 06/08/2019 0919 by Orvan Falconer, RN Outcome: Progressing Goal: Complications related to the disease process, condition or treatment will be avoided or minimized 06/08/2019 0919 by Orvan Falconer, RN Outcome: Progressing 06/08/2019 0919 by Orvan Falconer, RN Outcome: Progressing

## 2019-06-09 DIAGNOSIS — G4733 Obstructive sleep apnea (adult) (pediatric): Secondary | ICD-10-CM

## 2019-06-09 DIAGNOSIS — J9601 Acute respiratory failure with hypoxia: Secondary | ICD-10-CM

## 2019-06-09 LAB — COMPREHENSIVE METABOLIC PANEL
ALT: 101 U/L — ABNORMAL HIGH (ref 0–44)
AST: 183 U/L — ABNORMAL HIGH (ref 15–41)
Albumin: 3.1 g/dL — ABNORMAL LOW (ref 3.5–5.0)
Alkaline Phosphatase: 53 U/L (ref 38–126)
Anion gap: 8 (ref 5–15)
BUN: 49 mg/dL — ABNORMAL HIGH (ref 8–23)
CO2: 23 mmol/L (ref 22–32)
Calcium: 9.6 mg/dL (ref 8.9–10.3)
Chloride: 109 mmol/L (ref 98–111)
Creatinine, Ser: 1.42 mg/dL — ABNORMAL HIGH (ref 0.61–1.24)
GFR calc Af Amer: 57 mL/min — ABNORMAL LOW (ref >60)
GFR calc non Af Amer: 49 mL/min — ABNORMAL LOW (ref >60)
Glucose, Bld: 145 mg/dL — ABNORMAL HIGH (ref 70–99)
Potassium: 5 mmol/L (ref 3.5–5.1)
Sodium: 140 mmol/L (ref 135–145)
Total Bilirubin: 0.9 mg/dL (ref 0.3–1.2)
Total Protein: 6.2 g/dL — ABNORMAL LOW (ref 6.5–8.1)

## 2019-06-09 LAB — CBC WITH DIFFERENTIAL/PLATELET
Abs Immature Granulocytes: 0.06 10*3/uL (ref 0.00–0.07)
Basophils Absolute: 0 10*3/uL (ref 0.0–0.1)
Basophils Relative: 0 %
Eosinophils Absolute: 0 10*3/uL (ref 0.0–0.5)
Eosinophils Relative: 0 %
HCT: 42.5 % (ref 39.0–52.0)
Hemoglobin: 13.6 g/dL (ref 13.0–17.0)
Immature Granulocytes: 1 %
Lymphocytes Relative: 14 %
Lymphs Abs: 0.7 10*3/uL (ref 0.7–4.0)
MCH: 26.1 pg (ref 26.0–34.0)
MCHC: 32 g/dL (ref 30.0–36.0)
MCV: 81.6 fL (ref 80.0–100.0)
Monocytes Absolute: 0.6 10*3/uL (ref 0.1–1.0)
Monocytes Relative: 13 %
Neutro Abs: 3.5 10*3/uL (ref 1.7–7.7)
Neutrophils Relative %: 72 %
Platelets: 233 10*3/uL (ref 150–400)
RBC: 5.21 MIL/uL (ref 4.22–5.81)
RDW: 13.2 % (ref 11.5–15.5)
WBC: 4.9 10*3/uL (ref 4.0–10.5)
nRBC: 0 % (ref 0.0–0.2)

## 2019-06-09 LAB — GLUCOSE, CAPILLARY
Glucose-Capillary: 149 mg/dL — ABNORMAL HIGH (ref 70–99)
Glucose-Capillary: 253 mg/dL — ABNORMAL HIGH (ref 70–99)

## 2019-06-09 LAB — D-DIMER, QUANTITATIVE: D-Dimer, Quant: 0.36 ug/mL-FEU (ref 0.00–0.50)

## 2019-06-09 LAB — C-REACTIVE PROTEIN: CRP: 0.6 mg/dL (ref <1.0)

## 2019-06-09 LAB — MAGNESIUM: Magnesium: 2.1 mg/dL (ref 1.7–2.4)

## 2019-06-09 LAB — FERRITIN: Ferritin: 1384 ng/mL — ABNORMAL HIGH (ref 24–336)

## 2019-06-09 LAB — PHOSPHORUS: Phosphorus: 3.4 mg/dL (ref 2.5–4.6)

## 2019-06-09 MED ORDER — DEXAMETHASONE 6 MG PO TABS: 6.0000 mg | ORAL_TABLET | Freq: Every day | ORAL | 0 refills | Status: DC

## 2019-06-09 MED ORDER — GUAIFENESIN-DM 100-10 MG/5ML PO SYRP: 10.0000 mL | ORAL_SOLUTION | ORAL | 0 refills | Status: DC | PRN

## 2019-06-09 NOTE — Discharge Summary (Signed)
Physician Discharge Summary  GLADSTONE ROSAS TDV:[id number] DOB: [birth year] DOA: 06/05/2019  PCP: [name], MD  Admit date: 06/05/2019 Discharge date: 06/09/2019  Admitted From: home Disposition:  home  Recommendations for Outpatient Follow-up:  1. Follow up with PCP in 1-2 weeks 2. He is to continue Decadron for 5 additional days and will complete a 10-day course  Home Health: PT, OT Equipment/Devices: None  Discharge Condition: Stable CODE STATUS: Full code Diet recommendation: Diabetic  HPI: Per admitting MD, Corey [name] is a 74 y.o. male with medical history significant of CVA; seizures (not on AED); prostate CA; OSA on CPAP; CAD; HTN; and DM presenting with dizziness for a month.  The patient reports that he has been sick for about 6 weeks with dizziness.  He further reports that he doesn't know how he got COVID, but he lives with his son who had COVID.  His wife reports that "it wasn't a bad COVID", able to come out quarantine Sunday.  His wife reports that despite his son's illness, her husband has been going to church and helping people load things into their cars. ED Course: COVID - presented with dizziness, sepsis workup due to fever, given abx, then COVID +.  On 2L currently.  Hospital Course / Discharge diagnoses: Acute Hypoxic Respiratory Failure due to Covid-19 Viral Illness -patient initially required oxygen with admission, he was started on remdesivir and completed a 5-day course while hospitalized.  He was also started on steroids for 10-day total.  His clinical condition improved, he was able to be weaned off to room air, he is comfortable and will be discharged home in stable condition.  He has 5 more days of steroids remaining at the time of discharge.  His inflammatory markers are improving.  Patient was advised to follow-up with PCP in a couple of weeks  [place]    06/07/19 0551 06/07/19 0552 06/08/19 0645 06/09/19 0600  DDIMER  --   0.82* 0.67* 0.36  FERRITIN 1,044*  --  1,101* 1,384*  CRP 1.6*  --  0.8 0.6    No results found for: [place]  Essential hypertension -resume home medications on discharge Acute kidney injury chronic kidney disease stage IIIb -Baseline creatinine 1.3-1.6, 1.9 on admission, improving and now at baseline. Diabetes mellitus, poorly controlled, with hyperglycemia -resume home medications on d/c OSA -CPAP Prostate cancer -Status post radiation treatment 2017 History of gout -No flare  Discharge Instructions   Allergies as of 06/09/2019      Reactions   Hydrochlorothiazide Other (See Comments)   Gout , uncontrolled diabetes and renal insufficiency   Allopurinol Other (See Comments)   Renal insufficiency      Medication List    TAKE these medications   Accu-Chek FastClix Lancets Misc Use Accu Chek fastclix lancets to check blood sugar twice daily. DX:E11.65   Accu-Chek Guide Me w/Device Kit 1 each by Does not apply route 2 (two) times daily. Use Accu Chek Guide Me device to check blood sugar twice daily.   Accu-Chek Guide test strip Generic drug: glucose blood Use Accu Chek guide test strips as instructed to check blood sugar twice daily. DX:E11.65   albuterol 108 (90 Base) MCG/ACT inhaler Commonly known as: VENTOLIN HFA Inhale 2 puffs into the lungs every 4 (four) hours as needed for wheezing or shortness of breath.   amLODipine 10 MG tablet Commonly known as: NORVASC Take 1 tablet (10 mg total) by mouth daily.   aspirin EC  81 MG tablet Take 1 tablet (81 mg total) by mouth daily.   cloNIDine 0.3 MG tablet Commonly known as: CATAPRES TAKE 1 TABLET TWICE DAILY   colchicine 0.6 MG tablet Commonly known as: Colcrys Take 2 tabs once and then one tab one hour later as needed for gout What changed:   how much to take  how to take this  when to take this  reasons to take this  additional instructions   dexamethasone 6 MG tablet Commonly known as:  DECADRON Take 1 tablet (6 mg total) by mouth daily.   furosemide 20 MG tablet Commonly known as: LASIX Take 2 pills x 1 day, then 1 pill daily in the morning What changed:   how much to take  how to take this  when to take this  additional instructions   glimepiride 1 MG tablet Commonly known as: AMARYL Take 1/2 tab at bedtime What changed:   how much to take  how to take this  when to take this  additional instructions   guaiFENesin-dextromethorphan 100-10 MG/5ML syrup Commonly known as: ROBITUSSIN DM Take 10 mLs by mouth every 4 (four) hours as needed for cough.   hydrALAZINE 50 MG tablet Commonly known as: APRESOLINE Take 1 tablet (50 mg total) by mouth 3 (three) times daily.   isosorbide mononitrate 60 MG 24 hr tablet Commonly known as: IMDUR TAKE 1 TABLET TWICE DAILY   losartan 100 MG tablet Commonly known as: COZAAR TAKE 1 TABLET (100 MG TOTAL) BY MOUTH DAILY.   metFORMIN 750 MG 24 hr tablet Commonly known as: GLUCOPHAGE-XR Take 1 tablet (750 mg total) by mouth 2 (two) times daily. Take 1 tablet by mouth twice daily. What changed: additional instructions   metoprolol succinate 100 MG 24 hr tablet Commonly known as: TOPROL-XL Take 1 tablet (100 mg total) by mouth daily. Take with or immediately following a meal.      Follow-up Information    Burns, [name], MD. Schedule an appointment as soon as possible for a visit in 2 week(s).   Specialty: Internal Medicine Contact information: [place] [address] [phone]        Jerline Pain, MD .   Specialty: Cardiology Contact information: [phone] [place] [address] [phone]           Consultations:  None   Procedures/Studies:  DG Chest Port 1 View  Result Date: 06/05/2019 CLINICAL DATA:  Fever. Dizziness. EXAM: PORTABLE CHEST 1 VIEW COMPARISON:  03/05/2019 FINDINGS: Lower most aspect of the left chest not entirely included in the  field of view. The heart is enlarged, similar to prior exam allowing for differences in technique. Unchanged mediastinal contours. Patchy bilateral airspace disease both mid lung zones and right infrahilar region. Left basilar evaluation slightly limited by overlapping soft tissue. No pneumothorax. Limited assessment for pleural effusion. IMPRESSION: 1. Patchy bilateral airspace disease suspicious for pneumonia. Pulmonary edema is felt less likely. 2. Stable cardiomegaly. Electronically Signed   By: Keith Rake M.D.   On: 06/05/2019 05:42     Subjective: - no chest pain, shortness of breath, no abdominal pain, nausea or vomiting.   Discharge Exam: BP (!) 152/79 (BP Location: Right Arm)   Pulse 60   Temp 97.6 F (36.4 C) (Oral)   Resp 20   Ht 5' 6"  (1.676 m)   Wt 83.9 kg   SpO2 99%   BMI 29.86 kg/m   General: Pt is alert, awake,  not in acute distress Cardiovascular: RRR, S1/S2 +, no rubs, no gallops Respiratory: CTA bilaterally, no wheezing, no rhonchi Abdominal: Soft, NT, ND, bowel sounds + Extremities: no edema, no cyanosis    The results of significant diagnostics from this hospitalization (including imaging, microbiology, ancillary and laboratory) are listed below for reference.     Microbiology: Recent Results (from the past 240 hour(s))  Blood Culture (routine x 2)     Status: None (Preliminary result)   Collection Time: 06/05/19  5:42 AM   Specimen: BLOOD  Result Value Ref Range Status   Specimen Description BLOOD SITE NOT SPECIFIED  Final   Special Requests   Final    BOTTLES DRAWN AEROBIC AND ANAEROBIC Blood Culture adequate volume   Culture   Final    NO GROWTH 4 DAYS Performed at [facility], [address], [place], [place] [address]    Report Status PENDING  Incomplete  Blood Culture (routine x 2)     Status: None (Preliminary result)   Collection Time: 06/05/19  5:43 AM   Specimen: BLOOD RIGHT ARM  Result Value Ref Range Status   Specimen  Description BLOOD RIGHT ARM  Final   Special Requests   Final    BOTTLES DRAWN AEROBIC AND ANAEROBIC Blood Culture results may not be optimal due to an excessive volume of blood received in culture bottles   Culture   Final    NO GROWTH 4 DAYS Performed at [facility], [place] [address], [place], [place] [address]    Report Status PENDING  Incomplete  Urine culture     Status: Abnormal   Collection Time: 06/05/19  8:53 AM   Specimen: In/Out Cath Urine  Result Value Ref Range Status   Specimen Description IN/OUT CATH URINE  Final   Special Requests   Final    NONE Performed at [facility], [place] [address] [place], [place] [address]    Culture MULTIPLE SPECIES PRESENT, SUGGEST RECOLLECTION (A)  Final   Report Status 06/06/2019 FINAL  Final     Labs: Basic Metabolic Panel: Recent Labs  Lab 06/05/19 0457 06/06/19 0343 06/07/19 0552 06/08/19 0645 06/09/19 0600  NA 139 139 137 139 140  K 4.4 4.9 4.7 4.8 5.0  CL 107 109 108 109 109  CO2 20* 22 22 22 23   GLUCOSE 143* 205* 178* 101* 145*  BUN 26* 37* 45* 46* 49*  CREATININE 1.95* 1.71* 1.55* 1.42* 1.42*  CALCIUM 9.6 9.3 9.3 9.4 9.6  MG  --  1.9 2.2 2.2 2.1  PHOS  --  3.5 3.5 3.2 3.4   Liver Function Tests: Recent Labs  Lab 06/05/19 0457 06/06/19 0343 06/07/19 0552 06/08/19 0645 06/09/19 0600  AST 73* 65* 56* 94* 183*  ALT 32 32 31 48* 101*  ALKPHOS 57 52 51 50 53  BILITOT 0.7 0.4 0.6 0.8 0.9  PROT 6.5 6.0* 6.1* 6.1* 6.2*  ALBUMIN 3.4* 3.0* 3.0* 3.0* 3.1*   CBC: Recent Labs  Lab 06/05/19 0457 06/06/19 0343 06/07/19 0552 06/08/19 0645 06/09/19 0600  WBC 2.3* 1.7* 3.9* 4.6 4.9  NEUTROABS 1.4* 1.0* 3.0 3.5 3.5  HGB 12.9* 13.1 12.9* 13.5 13.6  HCT 41.5 41.1 39.5 40.9 42.5  MCV 85.9 82.5 80.9 80.7 81.6  PLT 105* 136* 171 206 233   CBG: Recent Labs  Lab 06/07/19 1949 06/08/19 0738 06/08/19 1201 06/08/19 1630 06/08/19 2032  GLUCAP 243* 138* 274* 263* 242*   Hgb A1c No results for  input(s):  HGBA1C in the last 72 hours. Lipid Profile No results for input(s): CHOL, HDL, LDLCALC, TRIG, CHOLHDL, LDLDIRECT in the last 72 hours. Thyroid function studies No results for input(s): TSH, T4TOTAL, T3FREE, THYROIDAB in the last 72 hours.  Invalid input(s): FREET3 Urinalysis    Component Value Date/Time   COLORURINE YELLOW 06/05/2019 [place] 06/05/2019 0849   LABSPEC 1.012 06/05/2019 0849   PHURINE 5.0 06/05/2019 0849   GLUCOSEU NEGATIVE 06/05/2019 0849   GLUCOSEU NEGATIVE 07/20/2014 1055   HGBUR MODERATE (A) 06/05/2019 0849   HGBUR negative 08/28/2008 1446   BILIRUBINUR NEGATIVE 06/05/2019 0849   BILIRUBINUR Neg 10/28/2014 0916   KETONESUR NEGATIVE 06/05/2019 0849   PROTEINUR 100 (A) 06/05/2019 0849   UROBILINOGEN 0.2 10/28/2014 0916   UROBILINOGEN 0.2 07/20/2014 1055   NITRITE NEGATIVE 06/05/2019 0849   LEUKOCYTESUR NEGATIVE 06/05/2019 0849    FURTHER DISCHARGE INSTRUCTIONS:   Get Medicines reviewed and adjusted: Please take all your medications with you for your next visit with your Primary MD   Laboratory/radiological data: Please request your Primary MD to go over all hospital tests and procedure/radiological results at the follow up, please ask your Primary MD to get all Hospital records sent to his/her office.   In some cases, they will be blood work, cultures and biopsy results pending at the time of your discharge. Please request that your primary care M.D. goes through all the records of your hospital data and follows up on these results.   Also Note the following: If you experience worsening of your admission symptoms, develop shortness of breath, life threatening emergency, suicidal or homicidal thoughts you must seek medical attention immediately by calling 911 or calling your MD immediately  if symptoms less severe.   You must read complete instructions/literature along with all the possible adverse reactions/side effects for all the Medicines  you take and that have been prescribed to you. Take any new Medicines after you have completely understood and accpet all the possible adverse reactions/side effects.    Do not drive when taking Pain medications or sleeping medications (Benzodaizepines)   Do not take more than prescribed Pain, Sleep and Anxiety Medications. It is not advisable to combine anxiety,sleep and pain medications without talking with your primary care practitioner   Special Instructions: If you have smoked or chewed Tobacco  in the last 2 yrs please stop smoking, stop any regular Alcohol  and or any Recreational drug use.   Wear Seat belts while driving.   Please note: You were cared for by a hospitalist during your hospital stay. Once you are discharged, your primary care physician will handle any further medical issues. Please note that NO REFILLS for any discharge medications will be authorized once you are discharged, as it is imperative that you return to your primary care physician (or establish a relationship with a primary care physician if you do not have one) for your post hospital discharge needs so that they can reassess your need for medications and monitor your lab values.  Time coordinating discharge: 40 minutes  SIGNED:  Marzetta Board, MD, PhD 06/09/2019, 11:03 AM

## 2019-06-09 NOTE — TOC Transition Note (Signed)
Transition of Care [facility]) - CM/SW Discharge Note   Patient Details  Name: Corey [name] MRN: [id number] Date of Birth: [birth year]  [facility] CM/SW Contact:  Zaelynn Fuchs, Chauncey Reading, RN Phone Number: 06/09/2019, 11:36 AM   Clinical Narrative:   Patient discharging today. Patient unable to hear over the phone. Call to wife, discussed home health , she is agreeable. No preference on providers. Offered choice. Discussed providers in network. Referred to [facility]. Wife to transport patient home.           Patient Goals and CMS Choice Patient states their goals for this hospitalization and ongoing recovery are:: return home CMS Medicare.gov Compare Post Acute Care list provided to:: Other (Comment Required)(wife-[name]) Choice offered to / list presented to : Spouse                       HH Arranged: PT, OT [place] Agency: [place] Date [place]: 06/09/19 Time [place]: 7416 Representative spoke with at [place]: [place] Determinants of Health ([place]) Interventions     Readmission Risk Interventions No flowsheet data found.

## 2019-06-09 NOTE — Care Management Important Message (Signed)
Important Message  Patient Details  Name: Corey [name] MRN: [id number] Date of Birth: [birth year]   Medicare Important Message Given:  Yes - Important Message mailed due to current National Emergency  Verbal consent obtained due to current National Emergency  Relationship to patient: Spouse/Significant Other Contact Name: Wasil Wolke Call Date: 06/09/19  Time: 1256 Phone: [phone] Outcome: Spoke with contact Important Message mailed to: Patient address on file    [name] 06/09/2019, 12:56 PM

## 2019-06-09 NOTE — Discharge Instructions (Signed)
Follow with [name], MD in 2-3 weeks  Please get a complete blood count and chemistry panel checked by your Primary MD at your next visit, and again as instructed by your Primary MD. Please get your medications reviewed and adjusted by your Primary MD.  Please request your Primary MD to go over all Hospital Tests and Procedure/Radiological results at the follow up, please get all Hospital records sent to your Prim MD by signing hospital release before you go home.  In some cases, there will be blood work, cultures and biopsy results pending at the time of your discharge. Please request that your primary care M.D. goes through all the records of your hospital data and follows up on these results.  If you had Pneumonia of Lung problems at the Hospital: Please get a 2 view Chest X ray done in 6-8 weeks after hospital discharge or sooner if instructed by your Primary MD.  If you have Congestive Heart Failure: Please call your Cardiologist or Primary MD anytime you have any of the following symptoms:  1) 3 pound weight gain in 24 hours or 5 pounds in 1 week  2) shortness of breath, with or without a dry hacking cough  3) swelling in the hands, feet or stomach  4) if you have to sleep on extra pillows at night in order to breathe  Follow cardiac low salt diet and 1.5 lit/day fluid restriction.  If you have diabetes Accuchecks 4 times/day, Once in AM empty stomach and then before each meal. Log in all results and show them to your primary doctor at your next visit. If any glucose reading is under 80 or above 300 call your primary MD immediately.  If you have Seizure/Convulsions/Epilepsy: Please do not drive, operate heavy machinery, participate in activities at heights or participate in high speed sports until you have seen by Primary MD or a Neurologist and advised to do so again. Per [facility] statutes, patients with seizures are not allowed to drive until they have been  seizure-free for six months.  Use caution when using heavy equipment or power tools. Avoid working on ladders or at heights. Take showers instead of baths. Ensure the water temperature is not too high on the home water heater. Do not go swimming alone. Do not lock yourself in a room alone (i.e. bathroom). When caring for infants or small children, sit down when holding, feeding, or changing them to minimize risk of injury to the child in the event you have a seizure. Maintain good sleep hygiene. Avoid alcohol.   If you had Gastrointestinal Bleeding: Please ask your Primary MD to check a complete blood count within one week of discharge or at your next visit. Your endoscopic/colonoscopic biopsies that are pending at the time of discharge, will also need to followed by your Primary MD.  Get Medicines reviewed and adjusted. Please take all your medications with you for your next visit with your Primary MD  Please request your Primary MD to go over all hospital tests and procedure/radiological results at the follow up, please ask your Primary MD to get all Hospital records sent to his/her office.  If you experience worsening of your admission symptoms, develop shortness of breath, life threatening emergency, suicidal or homicidal thoughts you must seek medical attention immediately by calling 911 or calling your MD immediately  if symptoms less severe.  You must read complete instructions/literature along with all the possible adverse reactions/side effects for all the Medicines you  take and that have been prescribed to you. Take any new Medicines after you have completely understood and accpet all the possible adverse reactions/side effects.   Do not drive or operate heavy machinery when taking Pain medications.   Do not take more than prescribed Pain, Sleep and Anxiety Medications  Special Instructions: If you have smoked or chewed Tobacco  in the last 2 yrs please stop smoking, stop any regular  Alcohol  and or any Recreational drug use.  Wear Seat belts while driving.  Please note You were cared for by a hospitalist during your hospital stay. If you have any questions about your discharge medications or the care you received while you were in the hospital after you are discharged, you can call the unit and asked to speak with the hospitalist on call if the hospitalist that took care of you is not available. Once you are discharged, your primary care physician will handle any further medical issues. Please note that NO REFILLS for any discharge medications will be authorized once you are discharged, as it is imperative that you return to your primary care physician (or establish a relationship with a primary care physician if you do not have one) for your aftercare needs so that they can reassess your need for medications and monitor your lab values.  You can reach the hospitalist office at phone [phone] or fax [phone]   If you do not have a primary care physician, you can call [phone] for a physician referral.  Activity: As tolerated with Full fall precautions use walker/cane & assistance as needed    Diet: diabetic  Disposition Home     COVID-19 Frequently Asked Questions COVID-19 (coronavirus disease) is an infection that is caused by a large family of viruses. Some viruses cause illness in people and others cause illness in animals like camels, cats, and bats. In some cases, the viruses that cause illness in animals can spread to humans. Where did the coronavirus come from? In December 2019, [place] told the [facility]) of several cases of lung disease (human respiratory illness). These cases were linked to an open seafood and livestock market in the city of [place]. The link to the seafood and livestock market suggests that the virus may have spread from animals to humans. However, since that first outbreak in December, the virus has also been shown to  spread from person to person. What is the name of the disease and the virus? Disease name Early on, this disease was called novel coronavirus. This is because scientists determined that the disease was caused by a new (novel) respiratory virus. The World Health Organization [facility]) has now named the disease COVID-19, or coronavirus disease. Virus name The virus that causes the disease is called severe acute respiratory syndrome coronavirus 2 (SARS-CoV-2). More information on disease and virus naming World Health Organization [facility]): www.who.int/emergencies/diseases/novel-coronavirus-2019/technical-guidance/naming-the-coronavirus-disease-(covid-2019)-and-the-virus-that-causes-it Who is at risk for complications from coronavirus disease? Some people may be at higher risk for complications from coronavirus disease. This includes older adults and people who have chronic diseases, such as heart disease, diabetes, and lung disease. If you are at higher risk for complications, take these extra precautions:  Stay home as much as possible.  Avoid social gatherings and travel.  Avoid close contact with others. Stay at least 6 ft (2 m) away from others, if possible.  Wash your hands often with soap and water for at least 20 seconds.  Avoid touching your face, mouth, nose, or eyes.  Keep  supplies on hand at home, such as food, medicine, and cleaning supplies.  If you must go out in public, wear a cloth face covering or face mask. Make sure your mask covers your nose and mouth. How does coronavirus disease spread? The virus that causes coronavirus disease spreads easily from person to person (is contagious). You may catch the virus by:  Breathing in droplets from an infected person. Droplets can be spread by a person breathing, speaking, singing, coughing, or sneezing.  Touching something, like a table or a doorknob, that was exposed to the virus (contaminated) and then touching your mouth, nose, or  eyes. Can I get the virus from touching surfaces or objects? There is still a lot that we do not know about the virus that causes coronavirus disease. Scientists are basing a lot of information on what they know about similar viruses, such as:  Viruses cannot generally survive on surfaces for long. They need a human body (host) to survive.  It is more likely that the virus is spread by close contact with people who are sick (direct contact), such as through: ? Shaking hands or hugging. ? Breathing in respiratory droplets that travel through the air. Droplets can be spread by a person breathing, speaking, singing, coughing, or sneezing.  It is less likely that the virus is spread when a person touches a surface or object that has the virus on it (indirect contact). The virus may be able to enter the body if the person touches a surface or object and then touches his or her face, eyes, nose, or mouth. Can a person spread the virus without having symptoms of the disease? It may be possible for the virus to spread before a person has symptoms of the disease, but this is most likely not the main way the virus is spreading. It is more likely for the virus to spread by being in close contact with people who are sick and breathing in the respiratory droplets spread by a person breathing, speaking, singing, coughing, or sneezing. What are the symptoms of coronavirus disease? Symptoms vary from person to person and can range from mild to severe. Symptoms may include:  Fever or chills.  Cough.  Difficulty breathing or feeling short of breath.  Headaches, body aches, or muscle aches.  Runny or stuffy (congested) nose.  Sore throat.  New loss of taste or smell.  Nausea, vomiting, or diarrhea. These symptoms can appear anywhere from 2 to 14 days after you have been exposed to the virus. Some people may not have any symptoms. If you develop symptoms, call your health care provider. People with severe  symptoms may need hospital care. Should I be tested for this virus? Your health care provider will decide whether to test you based on your symptoms, history of exposure, and your risk factors. How does a health care provider test for this virus? Health care providers will collect samples to send for testing. Samples may include:  Taking a swab of fluid from the back of your nose and throat, your nose, or your throat.  Taking fluid from the lungs by having you cough up mucus (sputum) into a sterile cup.  Taking a blood sample. Is there a treatment or vaccine for this virus? Currently, there is no vaccine to prevent coronavirus disease. Also, there are no medicines like antibiotics or antivirals to treat the virus. A person who becomes sick is given supportive care, which means rest and fluids. A person may also relieve  his or her symptoms by using over-the-counter medicines that treat sneezing, coughing, and runny nose. These are the same medicines that a person takes for the common cold. If you develop symptoms, call your health care provider. People with severe symptoms may need hospital care. What can I do to protect myself and my family from this virus?     You can protect yourself and your family by taking the same actions that you would take to prevent the spread of other viruses. Take the following actions:  Wash your hands often with soap and water for at least 20 seconds. If soap and water are not available, use alcohol-based hand sanitizer.  Avoid touching your face, mouth, nose, or eyes.  Cough or sneeze into a tissue, sleeve, or elbow. Do not cough or sneeze into your hand or the air. ? If you cough or sneeze into a tissue, throw it away immediately and wash your hands.  Disinfect objects and surfaces that you frequently touch every day.  Stay away from people who are sick.  Avoid going out in public, follow guidance from your state and local health authorities.  Avoid  crowded indoor spaces. Stay at least 6 ft (2 m) away from others.  If you must go out in public, wear a cloth face covering or face mask. Make sure your mask covers your nose and mouth.  Stay home if you are sick, except to get medical care. Call your health care provider before you get medical care. Your health care provider will tell you how long to stay home.  Make sure your vaccines are up to date. Ask your health care provider what vaccines you need. What should I do if I need to travel? Follow travel recommendations from your local health authority, the CDC, and WHO. Travel information and advice  Centers for Disease Control and Prevention (CDC): BodyEditor.hu  World Health Organization [facility]): ThirdIncome.ca Know the risks and take action to protect your health  You are at higher risk of getting coronavirus disease if you are traveling to areas with an outbreak or if you are exposed to travelers from areas with an outbreak.  Wash your hands often and practice good hygiene to lower the risk of catching or spreading the virus. What should I do if I am sick? General instructions to stop the spread of infection  Wash your hands often with soap and water for at least 20 seconds. If soap and water are not available, use alcohol-based hand sanitizer.  Cough or sneeze into a tissue, sleeve, or elbow. Do not cough or sneeze into your hand or the air.  If you cough or sneeze into a tissue, throw it away immediately and wash your hands.  Stay home unless you must get medical care. Call your health care provider or local health authority before you get medical care.  Avoid public areas. Do not take public transportation, if possible.  If you can, wear a mask if you must go out of the house or if you are in close contact with someone who is not sick. Make sure your mask covers your nose and  mouth. Keep your home clean  Disinfect objects and surfaces that are frequently touched every day. This may include: ? Counters and tables. ? Doorknobs and light switches. ? Sinks and faucets. ? Electronics such as phones, remote controls, keyboards, computers, and tablets.  Wash dishes in hot, soapy water or use a dishwasher. Air-dry your dishes.  [place] laundry in hot  water. Prevent infecting other household members  Let healthy household members care for children and pets, if possible. If you have to care for children or pets, wash your hands often and wear a mask.  Sleep in a different bedroom or bed, if possible.  Do not share personal items, such as razors, toothbrushes, deodorant, combs, brushes, towels, and washcloths. Where to find more information Centers for Disease Control and Prevention (CDC)  Information and news updates: https://www.butler-gonzalez.com/ World Health Organization [facility])  Information and news updates: MissExecutive.com.ee  Coronavirus health topic: https://www.castaneda.info/  Questions and answers on COVID-19: OpportunityDebt.at  Global tracker: who.sprinklr.com American Academy of Pediatrics (AAP)  Information for families: www.healthychildren.org/English/health-issues/conditions/chest-lungs/Pages/2019-Novel-Coronavirus.aspx The coronavirus situation is changing rapidly. Check your local health authority website or the CDC and [facility] websites for updates and news. When should I contact a health care provider?  Contact your health care provider if you have symptoms of an infection, such as fever or cough, and you: ? Have been near anyone who is known to have coronavirus disease. ? Have come into contact with a person who is suspected to have coronavirus disease. ? Have traveled to an area where there is an outbreak of COVID-19. When should I get emergency medical  care?  Get help right away by calling your local emergency services (911 in the [place] if you have: ? Trouble breathing. ? Pain or pressure in your chest. ? Confusion. ? Blue-tinged lips and fingernails. ? Difficulty waking from sleep. ? Symptoms that get worse. Let the emergency medical personnel know if you think you have coronavirus disease. Summary  A new respiratory virus is spreading from person to person and causing COVID-19 (coronavirus disease).  The virus that causes COVID-19 appears to spread easily. It spreads from one person to another through droplets from breathing, speaking, singing, coughing, or sneezing.  Older adults and those with chronic diseases are at higher risk of disease. If you are at higher risk for complications, take extra precautions.  There is currently no vaccine to prevent coronavirus disease. There are no medicines, such as antibiotics or antivirals, to treat the virus.  You can protect yourself and your family by washing your hands often, avoiding touching your face, and covering your coughs and sneezes. This information is not intended to replace advice given to you by your health care provider. Make sure you discuss any questions you have with your health care provider. Document Revised: 02/28/2019 Document Reviewed: 08/27/2018 Elsevier Patient Education  2020 [place].   COVID-19 COVID-19 is a respiratory infection that is caused by a virus called severe acute respiratory syndrome coronavirus 2 (SARS-CoV-2). The disease is also known as coronavirus disease or novel coronavirus. In some people, the virus may not cause any symptoms. In others, it may cause a serious infection. The infection can get worse quickly and can lead to complications, such as:  Pneumonia, or infection of the lungs.  Acute respiratory distress syndrome or ARDS. This is a condition in which fluid build-up in the lungs prevents the lungs from filling with air and passing  oxygen into the blood.  Acute respiratory failure. This is a condition in which there is not enough oxygen passing from the lungs to the body or when carbon dioxide is not passing from the lungs out of the body.  Sepsis or septic shock. This is a serious bodily reaction to an infection.  Blood clotting problems.  Secondary infections due to bacteria or fungus.  Organ failure. This is when your body's  organs stop working. The virus that causes COVID-19 is contagious. This means that it can spread from person to person through droplets from coughs and sneezes (respiratory secretions). What are the causes? This illness is caused by a virus. You may catch the virus by:  Breathing in droplets from an infected person. Droplets can be spread by a person breathing, speaking, singing, coughing, or sneezing.  Touching something, like a table or a doorknob, that was exposed to the virus (contaminated) and then touching your mouth, nose, or eyes. What increases the risk? Risk for infection You are more likely to be infected with this virus if you:  Are within 6 feet (2 meters) of a person with COVID-19.  Provide care for or live with a person who is infected with COVID-19.  Spend time in crowded indoor spaces or live in shared housing. Risk for serious illness You are more likely to become seriously ill from the virus if you:  Are 46 years of age or older. The higher your age, the more you are at risk for serious illness.  Live in a nursing home or long-term care facility.  Have cancer.  Have a long-term (chronic) disease such as: ? Chronic lung disease, including chronic obstructive pulmonary disease or asthma. ? A long-term disease that lowers your body's ability to fight infection (immunocompromised). ? Heart disease, including heart failure, a condition in which the arteries that lead to the heart become narrow or blocked (coronary artery disease), a disease which makes the heart  muscle thick, weak, or stiff (cardiomyopathy). ? Diabetes. ? Chronic kidney disease. ? Sickle cell disease, a condition in which red blood cells have an abnormal "sickle" shape. ? Liver disease.  Are obese. What are the signs or symptoms? Symptoms of this condition can range from mild to severe. Symptoms may appear any time from 2 to 14 days after being exposed to the virus. They include:  A fever or chills.  A cough.  Difficulty breathing.  Headaches, body aches, or muscle aches.  Runny or stuffy (congested) nose.  A sore throat.  New loss of taste or smell. Some people may also have stomach problems, such as nausea, vomiting, or diarrhea. Other people may not have any symptoms of COVID-19. How is this diagnosed? This condition may be diagnosed based on:  Your signs and symptoms, especially if: ? You live in an area with a COVID-19 outbreak. ? You recently traveled to or from an area where the virus is common. ? You provide care for or live with a person who was diagnosed with COVID-19. ? You were exposed to a person who was diagnosed with COVID-19.  A physical exam.  Lab tests, which may include: ? Taking a sample of fluid from the back of your nose and throat (nasopharyngeal fluid), your nose, or your throat using a swab. ? A sample of mucus from your lungs (sputum). ? Blood tests.  Imaging tests, which may include, X-rays, CT scan, or ultrasound. How is this treated? At present, there is no medicine to treat COVID-19. Medicines that treat other diseases are being used on a trial basis to see if they are effective against COVID-19. Your health care provider will talk with you about ways to treat your symptoms. For most people, the infection is mild and can be managed at home with rest, fluids, and over-the-counter medicines. Treatment for a serious infection usually takes places in a hospital intensive care unit (ICU). It may include one or more of  the following  treatments. These treatments are given until your symptoms improve.  Receiving fluids and medicines through an IV.  Supplemental oxygen. Extra oxygen is given through a tube in the nose, a face mask, or a hood.  Positioning you to lie on your stomach (prone position). This makes it easier for oxygen to get into the lungs.  Continuous positive airway pressure (CPAP) or bi-level positive airway pressure (BPAP) machine. This treatment uses mild air pressure to keep the airways open. A tube that is connected to a motor delivers oxygen to the body.  Ventilator. This treatment moves air into and out of the lungs by using a tube that is placed in your windpipe.  Tracheostomy. This is a procedure to create a hole in the neck so that a breathing tube can be inserted.  Extracorporeal membrane oxygenation (ECMO). This procedure gives the lungs a chance to recover by taking over the functions of the heart and lungs. It supplies oxygen to the body and removes carbon dioxide. Follow these instructions at home: Lifestyle  If you are sick, stay home except to get medical care. Your health care provider will tell you how long to stay home. Call your health care provider before you go for medical care.  Rest at home as told by your health care provider.  Do not use any products that contain nicotine or tobacco, such as cigarettes, e-cigarettes, and chewing tobacco. If you need help quitting, ask your health care provider.  Return to your normal activities as told by your health care provider. Ask your health care provider what activities are safe for you. General instructions  Take over-the-counter and prescription medicines only as told by your health care provider.  Drink enough fluid to keep your urine pale yellow.  Keep all follow-up visits as told by your health care provider. This is important. How is this prevented?  There is no vaccine to help prevent COVID-19 infection. However, there are  steps you can take to protect yourself and others from this virus. To protect yourself:   Do not travel to areas where COVID-19 is a risk. The areas where COVID-19 is reported change often. To identify high-risk areas and travel restrictions, check the CDC travel website: FatFares.com.br  If you live in, or must travel to, an area where COVID-19 is a risk, take precautions to avoid infection. ? Stay away from people who are sick. ? Wash your hands often with soap and water for 20 seconds. If soap and water are not available, use an alcohol-based hand sanitizer. ? Avoid touching your mouth, face, eyes, or nose. ? Avoid going out in public, follow guidance from your state and local health authorities. ? If you must go out in public, wear a cloth face covering or face mask. Make sure your mask covers your nose and mouth. ? Avoid crowded indoor spaces. Stay at least 6 feet (2 meters) away from others. ? Disinfect objects and surfaces that are frequently touched every day. This may include:  Counters and tables.  Doorknobs and light switches.  Sinks and faucets.  Electronics, such as phones, remote controls, keyboards, computers, and tablets. To protect others: If you have symptoms of COVID-19, take steps to prevent the virus from spreading to others.  If you think you have a COVID-19 infection, contact your health care provider right away. Tell your health care team that you think you may have a COVID-19 infection.  Stay home. Leave your house only to seek medical care.  Do not use public transport.  Do not travel while you are sick.  Wash your hands often with soap and water for 20 seconds. If soap and water are not available, use alcohol-based hand sanitizer.  Stay away from other members of your household. Let healthy household members care for children and pets, if possible. If you have to care for children or pets, wash your hands often and wear a mask. If possible, stay  in your own room, separate from others. Use a different bathroom.  Make sure that all people in your household wash their hands well and often.  Cough or sneeze into a tissue or your sleeve or elbow. Do not cough or sneeze into your hand or into the air.  Wear a cloth face covering or face mask. Make sure your mask covers your nose and mouth. Where to find more information  Centers for Disease Control and Prevention: PurpleGadgets.be  World Health Organization: https://www.castaneda.info/ Contact a health care provider if:  You live in or have traveled to an area where COVID-19 is a risk and you have symptoms of the infection.  You have had contact with someone who has COVID-19 and you have symptoms of the infection. Get help right away if:  You have trouble breathing.  You have pain or pressure in your chest.  You have confusion.  You have bluish lips and fingernails.  You have difficulty waking from sleep.  You have symptoms that get worse. These symptoms may represent a serious problem that is an emergency. Do not wait to see if the symptoms will go away. Get medical help right away. Call your local emergency services (911 in the [place] Do not drive yourself to the hospital. Let the emergency medical personnel know if you think you have COVID-19. Summary  COVID-19 is a respiratory infection that is caused by a virus. It is also known as coronavirus disease or novel coronavirus. It can cause serious infections, such as pneumonia, acute respiratory distress syndrome, acute respiratory failure, or sepsis.  The virus that causes COVID-19 is contagious. This means that it can spread from person to person through droplets from breathing, speaking, singing, coughing, or sneezing.  You are more likely to develop a serious illness if you are 78 years of age or older, have a weak immune system, live in a nursing home, or have chronic  disease.  There is no medicine to treat COVID-19. Your health care provider will talk with you about ways to treat your symptoms.  Take steps to protect yourself and others from infection. Wash your hands often and disinfect objects and surfaces that are frequently touched every day. Stay away from people who are sick and wear a mask if you are sick. This information is not intended to replace advice given to you by your health care provider. Make sure you discuss any questions you have with your health care provider. Document Revised: 02/28/2019 Document Reviewed: 06/06/2018 Elsevier Patient Education  [place].

## 2019-06-09 NOTE — Progress Notes (Signed)
Occupational Therapy Treatment Patient Details Name: Corey [name] MRN: [id number] DOB: 02/08/1946 Today's Date: 06/09/2019    History of present illness 74 y.o. mlae admitted on 06/05/19 for dizziness.  Dx with COVID 19 PNA initially requiring 2 L O2 [place].  Pt with significant PMH of seizures, renal insufficiency, prostate CA, HTN, gout, DM, CVA, bil cataract with failed L cataract surgery, h/o PEG and trach (seems to coorelate with the stroke).    OT comments  Pt progressing towards established OT goals. Providing pt with education and handout on energy conservation strategies for ADLs; pt verbalized understanding. Pt donning clothes with supervision in preparation for dc. Pt performing functional mobility in hallway with Supervision-Min Guard A. Continue to recommend dc to home with HHOT and will continue to follow acutely as admitted.    Follow Up Recommendations  Home health OT;Supervision/Assistance - 24 hour    Equipment Recommendations  3 in 1 bedside commode(As shower seat)    Recommendations for Other Services PT consult    Precautions / Restrictions Precautions Precautions: Fall Precaution Comments: Poor balance Restrictions Weight Bearing Restrictions: No       Mobility Bed Mobility               General bed mobility comments: Pt in recliner upon arrival  Transfers Overall transfer level: Needs assistance Equipment used: None Transfers: Sit to/from Stand Sit to Stand: Supervision         General transfer comment: Supervision for safety    Balance Overall balance assessment: Needs assistance Sitting-balance support: Feet supported;No upper extremity supported Sitting balance-Leahy Scale: Good     Standing balance support: No upper extremity supported;During functional activity Standing balance-Leahy Scale: Fair                             ADL either performed or assessed with clinical judgement   ADL Overall ADL's : Needs  assistance/impaired                 Upper Body Dressing : Supervision/safety;Set up;Sitting   Lower Body Dressing: Sit to/from stand;Supervision/safety;Set up   Toilet Transfer: Ambulation;BSC;Supervision/safety;Set up         Tub/Shower Transfer Details (indicate cue type and reason): Provided education on use of BSC for shower seat Functional mobility during ADLs: Min guard;Supervision/safety General ADL Comments: PRoviding education and handout for EC. Pt donning clothes in preparation for dc with Supervision. Pt performing functional mobiltiy in hall with SUpervision-Min guard A     Vision       Perception     Praxis      Cognition Arousal/Alertness: Awake/alert Behavior During Therapy: WFL for tasks assessed/performed Overall Cognitive Status: No family/caregiver present to determine baseline cognitive functioning                                 General Comments: Feel pt is close to baseline cognition. h/o CVA. Following commands and very pleasant. Requiring increased time for processing        Exercises Exercises: Other exercises Other Exercises Other Exercises: Provided handout and education on EC for ADLs and IADLs; focusing on ADLs. Pt verbalized understanding.   Shoulder Instructions       General Comments No signs of distress with mobility.     Pertinent Vitals/ Pain       Pain Assessment: No/denies pain  Home Living  Prior Functioning/Environment              Frequency  Min 2X/week        Progress Toward Goals  OT Goals(current goals can now be found in the care plan section)  Progress towards OT goals: Progressing toward goals  Acute Rehab OT Goals Patient Stated Goal: to get better and go home, do therapy for balance at home.  OT Goal Formulation: With patient Time For Goal Achievement: 06/20/19 Potential to Achieve Goals: Good ADL Goals Pt Will  Perform Grooming: with modified independence;standing Pt Will Perform Lower Body Dressing: with modified independence;sit to/from stand Pt Will Transfer to Toilet: with modified independence;ambulating;regular height toilet Pt Will Perform Toileting - Clothing Manipulation and hygiene: with modified independence;sitting/lateral leans;sit to/from stand Pt Will Perform Tub/Shower Transfer: Tub transfer;with min guard assist;3 in 1;ambulating Additional ADL Goal #1: Pt will verbalize three energy conservation techniques for ADLs with Min cues  Plan Discharge plan remains appropriate    Co-evaluation                 AM-PAC OT "6 Clicks" Daily Activity     Outcome Measure   Help from another person eating meals?: None Help from another person taking care of personal grooming?: A Little Help from another person toileting, which includes using toliet, bedpan, or urinal?: A Little Help from another person bathing (including washing, rinsing, drying)?: A Little Help from another person to put on and taking off regular upper body clothing?: None Help from another person to put on and taking off regular lower body clothing?: A Little 6 Click Score: 20    End of Session    OT Visit Diagnosis: Unsteadiness on feet (R26.81);Other abnormalities of gait and mobility (R26.89);Muscle weakness (generalized) (M62.81)   Activity Tolerance Patient tolerated treatment well   Patient Left in chair;with call bell/phone within reach   Nurse Communication Mobility status        Time: 1200-1220 OT Time Calculation (min): 20 min  Charges: OT General Charges $OT Visit: 1 Visit OT Treatments $Self Care/Home Management : 8-22 mins  [place], OTR/L Acute Rehab Pager: [phone] Office: [place] 06/09/2019, 1:00 PM

## 2019-06-09 NOTE — Progress Notes (Signed)
Removed pt x2 IVS Pt alert and orientd x4  Pt wife on phone for discharge Pt wife requested that prescriptions be sent to [facility] rather than be on paper Requested from Dr.  I explained to wife that might delay discharge Pt and wife verbalized understanding of discharge instructions

## 2019-06-10 ENCOUNTER — Other Ambulatory Visit: Payer: Self-pay

## 2019-06-10 ENCOUNTER — Telehealth: Payer: Self-pay | Admitting: *Deleted

## 2019-06-10 LAB — CULTURE, BLOOD (ROUTINE X 2)
Culture: NO GROWTH
Culture: NO GROWTH
Special Requests: ADEQUATE

## 2019-06-10 NOTE — Patient Outreach (Signed)
[facility]) Care Management  06/10/2019  [name] 02/21/1946 [id number]   Telephone Screen  Referral Date: 06/04/2019 Referral Source:Patient/Self Referral Referral Reason: "patients needs help getting eye exam, doesn't have eye MD" Insurance: [facility]   Outreach attempt #1 to patient. No answer. RN CM left HIPAA compliant voicemail message along with contact info.    Plan: RN CM will make outreach attempt to patient within 3-4 business days. RN CM will send unsuccessful outreach letter to patient.   Enzo Montgomery, RN,BSN,CCM [facility] Telephonic Care Management Coordinator Direct Phone: [phone] Toll Free: [phone] Fax: [phone]

## 2019-06-10 NOTE — Telephone Encounter (Signed)
Transition Care Management Follow-up Telephone Call   Date discharged? 06/09/19   How have you been since you were released from the hospital? Pt states he is doing ok   Do you understand why you were in the hospital? YES   Do you understand the discharge instructions? YES   Where were you discharged to? HOME   Items Reviewed:  Medications reviewed: YES, pt states he is taking his same medications. They did give him rx for decadron which he is still taking  Allergies reviewed: YES  Dietary changes reviewed: YES, carb modified  Referrals reviewed: YES, he states no one has contacted him about the referral for PT & OT. Inform pt someone should be calling him soon   Functional Questionnaire:   Activities of Daily Living (ADLs):   He states he are independent in the following: bathing and hygiene, feeding, continence, grooming, toileting and dressing States they require assistance with the following: ambulation   Any transportation issues/concerns?: NO   Any patient concerns? NO   Confirmed importance and date/time of follow-up visits scheduled YES, (virtual) appt 06/19/19  Provider Appointment booked with Dr. Quay Burow  Confirmed with patient if condition begins to worsen call PCP or go to the ER.  Patient was given the office number and encouraged to call back with question or concerns.  : YES

## 2019-06-11 NOTE — Progress Notes (Deleted)
Cardiology Office Note   Date:  06/11/2019   ID:  Corey, [name] [birth year], MRN [id number]  PCP:  [name], MD  Cardiologist: Dr. Marlou Porch, MD  No chief complaint on file.     History of Present Illness: Corey [name] is a 74 y.o. male who presents for 48-monthfollow-up, seen for Dr. [name]  Mr. KBetancurhas a history of severe left ventricular hypertrophy with normal EF, prolonged hospitalization in 2014 after PEA arrest with infection.  He was most recently seen by Dr. [name]12/05/2018 and was noted to have mild shortness of breath with activity at which time he was taking albuterol inhaler with improvement.  He reported no real change in his symptoms.  He continued to walk three times per week without much concern.  His heart rate was noted to be quite slow with rates in the mid to upper 40s.  Previous EKG in 02/2018 showed a heart rate of 51 bpm with first-degree AV block.  His Toprol was reduced to 100 mg p.o. daily with plans for close follow-up with notes that if bradycardia persists, plan to reduce BB once again.   1.  Sinus bradycardia with first-degree AV block: -Previously noted to be bradycardic during last office visit with heart rates in the 40s at which time his beta-blocker was reduced to 100 mg p.o. daily with plans for further reduction if bradycardia persist. -   2.  Hypertensive heart disease: -Post recent echocardiogram performed 05/30/2017 with severe LVH and normal LVEF with no significant change when compared to prior studies. -Stable, -Continue current regimen He was noted   Past Medical History:  Diagnosis Date  . Cataract   . Colon polyp 2003   Dr SLyla Son F/U was to be 2008( not completed)  . CVA (cerebral infarction) 2014  . Diabetes mellitus   . Diverticulosis 2003  . Dyspnea    with exertion  . Gout   . Hypertension   . Myocardial infarction ([name] 2014  . OSA (obstructive sleep apnea) 03/27/2018  . Pneumonia 09/29/11   Avelox  X 10 days as OP  . [place]state cancer ([name]   . Renal insufficiency   . Seizures ([name]    not treated for seizure disorder; had a seizure after stroke 2014; no seizure since then    Past Surgical History:  Procedure Laterality Date  . COLONOSCOPY W/ POLYPECTOMY  2003   no F/U ([name] 12/03/12)  . INSERTION OF MESH N/A 07/16/2017   Procedure: INSERTION OF MESH;  Surgeon: [name] MD;  [facility]  Service: General;  Laterality: N/A;  . PEG PLACEMENT  2014  . PEG TUBE REMOVAL  2014  . PROSTATE BIOPSY    . TRACHEOSTOMY  2014   was closed after hospital stay  . VENTRAL HERNIA REPAIR N/A 07/16/2017   Procedure: LAPAROSCOPIC VENTRAL HERNIA REPAIR WITH MESH;  Surgeon: [name] MD;  [place] [name]  Service: General;  Laterality: N/A;  . wrist aspiration  02/16/12    monosodium urate crystals; Dr [name]    Current Outpatient Medications  Medication Sig Dispense Refill  . Accu-Chek FastClix Lancets MISC Use Accu Chek fastclix lancets to check blood sugar twice daily. DX:E11.65 200 each 2  . albuterol (VENTOLIN HFA) 108 (90 Base) MCG/ACT inhaler Inhale 2 puffs into the lungs every 4 (four) hours as needed for wheezing or shortness of breath. 18 g 1  . amLODipine (NORVASC) 10 MG  tablet Take 1 tablet (10 mg total) by mouth daily. 90 tablet 0  . aspirin EC 81 MG tablet Take 1 tablet (81 mg total) by mouth daily.    . Blood Glucose Monitoring Suppl (ACCU-CHEK GUIDE ME) w/Device KIT 1 each by Does not apply route 2 (two) times daily. Use Accu Chek Guide Me device to check blood sugar twice daily.    . cloNIDine (CATAPRES) 0.3 MG tablet TAKE 1 TABLET TWICE DAILY (Patient taking differently: Take 0.3 mg by mouth 2 (two) times daily. ) 180 tablet 1  . colchicine (COLCRYS) 0.6 MG tablet Take 2 tabs once and then one tab one hour later as needed for gout (Patient taking differently: Take 0.6-1.2 mg by mouth 2 (two) times daily as needed (gout flares). ) 60 tablet 1  .  dexamethasone (DECADRON) 6 MG tablet Take 1 tablet (6 mg total) by mouth daily. 5 tablet 0  . furosemide (LASIX) 20 MG tablet Take 2 pills x 1 day, then 1 pill daily in the morning (Patient taking differently: Take 20 mg by mouth daily. ) 30 tablet 3  . glimepiride (AMARYL) 1 MG tablet Take 1/2 tab at bedtime (Patient taking differently: Take 0.5 mg by mouth at bedtime. ) 45 tablet 1  . glucose blood (ACCU-CHEK GUIDE) test strip Use Accu Chek guide test strips as instructed to check blood sugar twice daily. DX:E11.65 100 each 2  . guaiFENesin-dextromethorphan (ROBITUSSIN DM) 100-10 MG/5ML syrup Take 10 mLs by mouth every 4 (four) hours as needed for cough. 118 mL 0  . hydrALAZINE (APRESOLINE) 50 MG tablet Take 1 tablet (50 mg total) by mouth 3 (three) times daily. 270 tablet 1  . isosorbide mononitrate (IMDUR) 60 MG 24 hr tablet TAKE 1 TABLET TWICE DAILY (Patient taking differently: Take 60 mg by mouth 2 (two) times daily. ) 180 tablet 1  . losartan (COZAAR) 100 MG tablet TAKE 1 TABLET (100 MG TOTAL) BY MOUTH DAILY. 90 tablet 1  . metFORMIN (GLUCOPHAGE-XR) 750 MG 24 hr tablet Take 1 tablet (750 mg total) by mouth 2 (two) times daily. Take 1 tablet by mouth twice daily. (Patient taking differently: Take 750 mg by mouth 2 (two) times daily. ) 180 tablet 1  . metoprolol succinate (TOPROL-XL) 100 MG 24 hr tablet Take 1 tablet (100 mg total) by mouth daily. Take with or immediately following a meal. 90 tablet 3   No current facility-administered medications for this visit.    Allergies:   Hydrochlorothiazide and Allopurinol    Social History:  The patient  reports that he quit smoking about 51 years ago. His smoking use included cigarettes. He has a 0.25 pack-year smoking history. He has never used smokeless tobacco. He reports that he does not drink alcohol or use drugs.   Family History:  The patient's ***family history includes Cancer in his father; Diabetes in his father and mother; Hypertension in  his father and mother; Prostate cancer in his father.    ROS:  Please see the history of present illness.   Otherwise, review of systems are positive for {NONE DEFAULTED:18576::"none"}.   All other systems are reviewed and negative.    PHYSICAL EXAM: VS:  There were no vitals taken for this visit. , BMI There is no height or weight on file to calculate BMI. GEN: Well nourished, well developed, in no acute distress HEENT: normal Neck: no JVD, carotid bruits, or masses Cardiac: ***RRR; no murmurs, rubs, or gallops,no edema  Respiratory:  clear to auscultation  bilaterally, normal work of breathing GI: soft, nontender, nondistended, + BS MS: no deformity or atrophy Skin: warm and dry, no rash Neuro:  Strength and sensation are intact Psych: euthymic mood, full affect   EKG:  EKG {ACTION; IS/IS HXT:[id number]} ordered today. The ekg ordered today demonstrates ***   Recent Labs: 03/05/2019: Pro B Natriuretic peptide (BNP) 1,543.0 06/06/2019: TSH 0.243 06/09/2019: ALT 101; BUN 49; Creatinine, Ser 1.42; Hemoglobin 13.6; Magnesium 2.1; Platelets 233; Potassium 5.0; Sodium 140    Lipid Panel    Component Value Date/Time   CHOL 125 04/30/2019 1144   TRIG 128 06/05/2019 0628   HDL 46.50 04/30/2019 1144   CHOLHDL 3 04/30/2019 1144   VLDL 12.0 04/30/2019 1144   LDLCALC 67 04/30/2019 1144      Wt Readings from Last 3 Encounters:  06/05/19 185 lb (83.9 kg)  05/07/19 185 lb 12.8 oz (84.3 kg)  04/15/19 182 lb 12.8 oz (82.9 kg)      Other studies Reviewed: Additional studies/ records that were reviewed today include: ***. Review of the above records demonstrates: ***  Echocardiogram 05/30/2017: Severe left ventricular hypertrophy, normal ejection fraction. No significant changes when compared to prior study a proximally 5 years ago. In fact, pericardial effusion remains. No evidence of tamponade.   ASSESSMENT AND PLAN:  1.  ***   Current medicines are reviewed at length with  the patient today.  The patient {ACTIONS; HAS/DOES NOT HAVE:19233} concerns regarding medicines.  The following changes have been made:  {PLAN; NO CHANGE:13088:s}  Labs/ tests ordered today include: *** No orders of the defined types were placed in this encounter.    Disposition:   FU with *** in {gen number [id number]} {Days to years:10300}  Signed, Kathyrn Drown, NP  06/11/2019 4:31 PM    [facility] [address], [place], [place]  [address] Phone: [phone]; Fax: [phone]

## 2019-06-12 ENCOUNTER — Other Ambulatory Visit: Payer: Self-pay

## 2019-06-12 DIAGNOSIS — I2510 Atherosclerotic heart disease of native coronary artery without angina pectoris: Secondary | ICD-10-CM | POA: Diagnosis not present

## 2019-06-12 DIAGNOSIS — E1122 Type 2 diabetes mellitus with diabetic chronic kidney disease: Secondary | ICD-10-CM | POA: Diagnosis not present

## 2019-06-12 DIAGNOSIS — I130 Hypertensive heart and chronic kidney disease with heart failure and stage 1 through stage 4 chronic kidney disease, or unspecified chronic kidney disease: Secondary | ICD-10-CM | POA: Diagnosis not present

## 2019-06-12 DIAGNOSIS — I509 Heart failure, unspecified: Secondary | ICD-10-CM | POA: Diagnosis not present

## 2019-06-12 DIAGNOSIS — J9601 Acute respiratory failure with hypoxia: Secondary | ICD-10-CM | POA: Diagnosis not present

## 2019-06-12 DIAGNOSIS — M1030 Gout due to renal impairment, unspecified site: Secondary | ICD-10-CM | POA: Diagnosis not present

## 2019-06-12 DIAGNOSIS — I252 Old myocardial infarction: Secondary | ICD-10-CM | POA: Diagnosis not present

## 2019-06-12 DIAGNOSIS — U071 COVID-19: Secondary | ICD-10-CM | POA: Diagnosis not present

## 2019-06-12 DIAGNOSIS — N1832 Chronic kidney disease, stage 3b: Secondary | ICD-10-CM | POA: Diagnosis not present

## 2019-06-12 DIAGNOSIS — I10 Essential (primary) hypertension: Secondary | ICD-10-CM | POA: Diagnosis not present

## 2019-06-12 NOTE — Patient Outreach (Addendum)
[facility]) Care Management  06/12/2019   Corey [name] 1945/11/25 [id number]  Initial Assessment Telephone Screen  Referral Date:06/04/2019 Referral Source:Patient/Self Referral Referral Reason:"patients needs help getting eye exam, doesn't have eye MD" Insurance:[employer]   Outreach attempt #2 to patient. Spoke with patient. He denies any acute issues or concerns at present. He reports that he has been doing fairly well. Discussed referral reason with pt. He reports he saw and eye MD last about six months. He was nto happy with Md and wishes to change. RN CM discussed with pt calling [employer] customer service to get a list of in network providers. He will call and follow up. He lives with supportive spouse who assists him with getting to appts. He denies any recent falls. He reports that hs is awaiting delivery of walker. [facility] ordered and patient is awaiting initial visit.   Conditions: Per chart review, patient has PMH that includes but not limited to DM,CKD,CVA, Gout, HTN, MI, OSA and prostate Ca. Patient was recently hospitalized from 06/05/2019-06/09/2019 for COVID-19 infection. (PCP office does TOC) He denies any SOB. He reports that he is recovering well. He does state he has some loss of appetite and taste. He is drinking Ensures. He is unsure of wgt lost. Blood sugars have been in the 120-130s since returning home. Patient reports he is checking cbgs about 2x/day and BP 1x/day. He does complain of some dizziness at times.    Appointments: Patient has not called to make PCP follow up post discharge. He was advised to do and will call office this week.  Medications: Med review completed with patient. He denies any issues at present managing and/or affording his meds.   Current Medications:  Current Outpatient Medications  Medication Sig Dispense Refill  . Accu-Chek FastClix Lancets MISC Use Accu Chek fastclix lancets to check blood sugar twice  daily. DX:E11.65 200 each 2  . albuterol (VENTOLIN HFA) 108 (90 Base) MCG/ACT inhaler Inhale 2 puffs into the lungs every 4 (four) hours as needed for wheezing or shortness of breath. 18 g 1  . amLODipine (NORVASC) 10 MG tablet Take 1 tablet (10 mg total) by mouth daily. 90 tablet 0  . aspirin EC 81 MG tablet Take 1 tablet (81 mg total) by mouth daily.    . Blood Glucose Monitoring Suppl (ACCU-CHEK GUIDE ME) w/Device KIT 1 each by Does not apply route 2 (two) times daily. Use Accu Chek Guide Me device to check blood sugar twice daily.    . cloNIDine (CATAPRES) 0.3 MG tablet TAKE 1 TABLET TWICE DAILY (Patient taking differently: Take 0.3 mg by mouth 2 (two) times daily. ) 180 tablet 1  . colchicine (COLCRYS) 0.6 MG tablet Take 2 tabs once and then one tab one hour later as needed for gout (Patient taking differently: Take 0.6-1.2 mg by mouth 2 (two) times daily as needed (gout flares). ) 60 tablet 1  . dexamethasone (DECADRON) 6 MG tablet Take 1 tablet (6 mg total) by mouth daily. 5 tablet 0  . furosemide (LASIX) 20 MG tablet Take 2 pills x 1 day, then 1 pill daily in the morning (Patient taking differently: Take 20 mg by mouth daily. ) 30 tablet 3  . glimepiride (AMARYL) 1 MG tablet Take 1/2 tab at bedtime (Patient taking differently: Take 0.5 mg by mouth at bedtime. ) 45 tablet 1  . glucose blood (ACCU-CHEK GUIDE) test strip Use Accu Chek guide test strips as instructed to check blood sugar twice  daily. DX:E11.65 100 each 2  . guaiFENesin-dextromethorphan (ROBITUSSIN DM) 100-10 MG/5ML syrup Take 10 mLs by mouth every 4 (four) hours as needed for cough. 118 mL 0  . hydrALAZINE (APRESOLINE) 50 MG tablet Take 1 tablet (50 mg total) by mouth 3 (three) times daily. 270 tablet 1  . isosorbide mononitrate (IMDUR) 60 MG 24 hr tablet TAKE 1 TABLET TWICE DAILY (Patient taking differently: Take 60 mg by mouth 2 (two) times daily. ) 180 tablet 1  . losartan (COZAAR) 100 MG tablet TAKE 1 TABLET (100 MG TOTAL) BY  MOUTH DAILY. 90 tablet 1  . metFORMIN (GLUCOPHAGE-XR) 750 MG 24 hr tablet Take 1 tablet (750 mg total) by mouth 2 (two) times daily. Take 1 tablet by mouth twice daily. (Patient taking differently: Take 750 mg by mouth 2 (two) times daily. ) 180 tablet 1  . metoprolol succinate (TOPROL-XL) 100 MG 24 hr tablet Take 1 tablet (100 mg total) by mouth daily. Take with or immediately following a meal. 90 tablet 3   No current facility-administered medications for this visit.    Functional Status:  In your present state of health, do you have any difficulty performing the following activities: 06/12/2019 06/07/2019  Hearing? N -  Vision? Y -  Comment pt reports impaired vision -  Difficulty concentrating or making decisions? N -  Walking or climbing stairs? N -  Dressing or bathing? N -  Doing errands, shopping? N N  Preparing Food and eating ? N -  Using the Toilet? N -  In the past six months, have you accidently leaked urine? N -  Do you have problems with loss of bowel control? N -  Managing your Medications? N -  Managing your Finances? N -  Housekeeping or managing your Housekeeping? Y -  Comment wife assists -  Some recent data might be hidden    Fall/Depression Screening: Fall Risk  06/12/2019 12/03/2018 07/23/2018  Falls in the past year? 0 0 0  Number falls in past yr: 0 - -  Injury with Fall? 0 - -  Risk for fall due to : Impaired vision;Medication side effect - -  Follow up Education provided;Falls evaluation completed Falls evaluation completed -   PHQ 2/9 Scores 06/12/2019 07/23/2018 04/13/2017 07/12/2016 05/31/2016 04/27/2016 03/28/2016  PHQ - 2 Score 0 0 0 0 0 0 0    Assessment:  THN CM Care Plan Problem One     Most Recent Value  Care Plan Problem One  Knowledge deficit related to disease process and mgmt of HTN  Role Documenting the Problem One  Care Management Telephonic Coordinator  Care Plan for Problem One  Active  THN Long Term Goal   Patient will report montioring  BP daily and keeping log consistently over the next 60 days.  THN Long Term Goal Start Date  06/12/19  Interventions for Problem One Long Term Goal  RN CM provided education to pt regarding HTn mgmt. RN CM discussed importance of BP monitoring and recording.  THN CM Short Term Goal #1   Patient will report adhering to low salt diet at least 75-100% of the time over the next 30 day.  THN CM Short Term Goal #1 Start Date  06/12/19  Interventions for Short Term Goal #1  RNCM discussed nutrition and diet education with pt.    THN CM Care Plan Problem Two     Most Recent Value  Care Plan Problem Two  Patient at risk for hospital readmission.    Role Documenting the Problem Two  Care Management Telephonic [place] for Problem Two  Active  Interventions for Problem Two Long Term Goal   RN CM assessed for any acutes issues/sxs. RN CM dsicussed action plan with pt for worsening sxs. RN CM reviewed and assessed med adherence.   THN Long Term Goal  Patient will have no readmission to hospital over the next 31 days.  THN Long Term Goal Start Date  06/12/19  [facility] CM Short Term Goal #1   Patient will complete all MD appts over the next 30 days.  THN CM Short Term Goal #1 Start Date  06/12/19  Interventions for Short Term Goal #2   RNCM educated pt. on importance of MD follow up. RN CM assessed for barriers to getting to appt and instructed pt. on how to make appt   THN CM Short Term Goal #2   Patient will report actively participating with HHPT,OT over the next 30 days  THN CM Short Term Goal #2 Start Date  06/12/19  Interventions for Short Term Goal #2  RNCM completed fall/safety eval and provided ecucation. RN CM assessed for DME usage and if [facility] services in place.    THN CM Care Plan Problem Three     Most Recent Value  Care Plan Problem Three  Decreased appetite and loss of taste.  Role Documenting the Problem Three  Care Management Telephonic Coordinator  Care Plan for Problem Three   Active  THN Long Term Goal   Patient will mainatin wgt and rpeort no wgt lost over the next 60 days.  THN Long Term Goal Start Date  06/12/19  Interventions for Problem Three Long Term Goal  RNCM completed nutrition assessment. RN CM discussed ways to improve appetite/nutrition.     Consent: [facility] services reviewed and discussed with pt. Verbal consent for services given.   Plan:  RN CM discussed with patient next outreach within a month. Patient gave verbal consent and in agreement with RN CM follow up and timeframe. Patient aware that they may contact RN CM sooner for any issues or concerns. RN CM will send welcome letter to patient. RN CM will send barriers letter and route encounter to PCP.  Enzo Montgomery, RN,BSN,CCM [place] [facility] Telephonic Care Management Coordinator Direct Phone: [phone] Toll Free: [phone] Fax: [phone]

## 2019-06-17 DIAGNOSIS — U071 COVID-19: Secondary | ICD-10-CM | POA: Diagnosis not present

## 2019-06-17 DIAGNOSIS — I509 Heart failure, unspecified: Secondary | ICD-10-CM | POA: Diagnosis not present

## 2019-06-17 DIAGNOSIS — I2510 Atherosclerotic heart disease of native coronary artery without angina pectoris: Secondary | ICD-10-CM | POA: Diagnosis not present

## 2019-06-17 DIAGNOSIS — N1832 Chronic kidney disease, stage 3b: Secondary | ICD-10-CM | POA: Diagnosis not present

## 2019-06-17 DIAGNOSIS — I130 Hypertensive heart and chronic kidney disease with heart failure and stage 1 through stage 4 chronic kidney disease, or unspecified chronic kidney disease: Secondary | ICD-10-CM | POA: Diagnosis not present

## 2019-06-17 DIAGNOSIS — E1122 Type 2 diabetes mellitus with diabetic chronic kidney disease: Secondary | ICD-10-CM | POA: Diagnosis not present

## 2019-06-17 DIAGNOSIS — M1030 Gout due to renal impairment, unspecified site: Secondary | ICD-10-CM | POA: Diagnosis not present

## 2019-06-17 DIAGNOSIS — I252 Old myocardial infarction: Secondary | ICD-10-CM | POA: Diagnosis not present

## 2019-06-17 DIAGNOSIS — J9601 Acute respiratory failure with hypoxia: Secondary | ICD-10-CM | POA: Diagnosis not present

## 2019-06-18 ENCOUNTER — Ambulatory Visit: Payer: Medicare HMO | Admitting: Cardiology

## 2019-06-18 NOTE — Progress Notes (Signed)
Virtual Visit via Video Note  I connected with Corey [name] on 06/19/19 at 10:00 AM EST by a video enabled telemedicine application and verified that I am speaking with the correct person using two identifiers.   I discussed the limitations of evaluation and management by telemedicine and the availability of in person appointments. The patient expressed understanding and agreed to proceed.  Present for the visit:  Myself, Dr Billey Gosling, Corey [name] his son.  The patient is currently at home and I am in the office.    No referring provider.    History of Present Illness: He is here for follow up from his recent hospitalization.     Admitted 06/05/19 - 06/09/2019 for acute hypoxic resp failure due to covid-19 infection  He went to the ED with dizziness for one month-6 weeks.  He had fever, weakness and work up in ED revealed he was COVID positive.  His son had covid and he lives with him.    Acute respiratory failure with hypoxia due to Covid-19 infection: Had dizziness, weakness, fever, mild anorexia Required 2 L [place] of oxygen WBC low, increased BUN/Cr, increased LFTs, elevated D-dimer, increased ferritin CXR with multifocal opacities c/w COVID vx multifocal PNA procalcitonin < 0.5 - no antibiotics given Admitted to [facility], Decadron, Remdesivir  Stage 3 b CKD: Acute on chronic, likely more progression of CKD Cozaar and metformin held  Htn: Continue norvasc, catapres, hydralazine, toprol xl Cozaar held  DM, uncontrolled: Metformin and amaryl held Received SSI  OSA: cpap held while in hospital   He is taking all of his medications as prescribed.  He states overall he feels pretty good.  He still has a dry cough and occasionally his cough is productive of yellow sputum.  He is still experiencing shortness of breath, but both the cough and shortness of breath are better.  He has had some chills, but denies any fevers.  He feels his appetite is pretty good.  He still feels  very tired, but has improved since discharge.  His sugars at home have been well controlled-118-119.  His blood pressure has been 140/?.  Overall he feels he is moving in the right direction.  He is trying to drink plenty of fluids.  He has no concerns.    Review of Systems  Constitutional: Positive for chills and malaise/fatigue. Negative for fever.  Respiratory: Positive for cough, sputum production and shortness of breath.   Cardiovascular: Positive for palpitations. Negative for chest pain and leg swelling.  Gastrointestinal: Negative for diarrhea and nausea.  Neurological: Negative for headaches.      Social History   Socioeconomic History  . Marital status: Married    Spouse name: [name]  . Number of children: 2  . Years of education: 60  . Highest education level: 12th grade  Occupational History  . Not on file  Tobacco Use  . Smoking status: Former Smoker    Packs/day: 0.25    Years: 1.00    Pack years: 0.25    Types: Cigarettes    Quit date: 05/15/1968    Years since quitting: 51.1  . Smokeless tobacco: Never Used  . Tobacco comment: smoked [place], up to 1-2 cigarettes/ day  Substance and Sexual Activity  . Alcohol use: No    Alcohol/week: 0.0 standard drinks  . Drug use: No  . Sexual activity: Yes  Other Topics Concern  . Not on file  Social History Narrative   Walks three times a week,  2-3 miles, occasional walks on treadmill       Patient is right-handed. He lives with his wife in a one level home. He drinks 1 diet Mt. Dew a day and rarely has coffee. He walks 3 x a week for exercise.   Social Determinants of Health   Financial Resource Strain:   . Difficulty of Paying Living Expenses: Not on file  Food Insecurity: No Food Insecurity  . Worried About Charity fundraiser in the Last Year: Never true  . Ran Out of Food in the Last Year: Never true  Transportation Needs: No Transportation Needs  . Lack of Transportation (Medical): No  . Lack  of Transportation (Non-Medical): No  Physical Activity:   . Days of Exercise per Week: Not on file  . Minutes of Exercise per Session: Not on file  Stress:   . Feeling of Stress : Not on file  Social Connections:   . Frequency of Communication with Friends and Family: Not on file  . Frequency of Social Gatherings with Friends and Family: Not on file  . Attends Religious Services: Not on file  . Active Member of Clubs or Organizations: Not on file  . Attends Archivist Meetings: Not on file  . Marital Status: Not on file     Observations/Objective: Appears well in NAD Breathing normally, speaking full sentences without difficulty   Assessment and Plan:  See Problem List for Assessment and Plan of chronic medical problems.   Follow Up Instructions:    I discussed the assessment and treatment plan with the patient. The patient was provided an opportunity to ask questions and all were answered. The patient agreed with the plan and demonstrated an understanding of the instructions.   The patient was advised to call back or seek an in-person evaluation if the symptoms worsen or if the condition fails to improve as anticipated.    [name], MD

## 2019-06-19 ENCOUNTER — Ambulatory Visit (INDEPENDENT_AMBULATORY_CARE_PROVIDER_SITE_OTHER): Payer: Medicare HMO | Admitting: Internal Medicine

## 2019-06-19 ENCOUNTER — Encounter: Payer: Self-pay | Admitting: Internal Medicine

## 2019-06-19 DIAGNOSIS — M1030 Gout due to renal impairment, unspecified site: Secondary | ICD-10-CM | POA: Diagnosis not present

## 2019-06-19 DIAGNOSIS — J9601 Acute respiratory failure with hypoxia: Secondary | ICD-10-CM | POA: Diagnosis not present

## 2019-06-19 DIAGNOSIS — N1832 Chronic kidney disease, stage 3b: Secondary | ICD-10-CM | POA: Diagnosis not present

## 2019-06-19 DIAGNOSIS — I10 Essential (primary) hypertension: Secondary | ICD-10-CM

## 2019-06-19 DIAGNOSIS — U071 COVID-19: Secondary | ICD-10-CM | POA: Diagnosis not present

## 2019-06-19 DIAGNOSIS — J069 Acute upper respiratory infection, unspecified: Secondary | ICD-10-CM | POA: Diagnosis not present

## 2019-06-19 DIAGNOSIS — I509 Heart failure, unspecified: Secondary | ICD-10-CM | POA: Diagnosis not present

## 2019-06-19 DIAGNOSIS — E1122 Type 2 diabetes mellitus with diabetic chronic kidney disease: Secondary | ICD-10-CM | POA: Diagnosis not present

## 2019-06-19 DIAGNOSIS — I130 Hypertensive heart and chronic kidney disease with heart failure and stage 1 through stage 4 chronic kidney disease, or unspecified chronic kidney disease: Secondary | ICD-10-CM | POA: Diagnosis not present

## 2019-06-19 DIAGNOSIS — Z794 Long term (current) use of insulin: Secondary | ICD-10-CM | POA: Diagnosis not present

## 2019-06-19 DIAGNOSIS — I252 Old myocardial infarction: Secondary | ICD-10-CM | POA: Diagnosis not present

## 2019-06-19 DIAGNOSIS — I2510 Atherosclerotic heart disease of native coronary artery without angina pectoris: Secondary | ICD-10-CM | POA: Diagnosis not present

## 2019-06-19 NOTE — Assessment & Plan Note (Signed)
Chronic Sugars well controlled at home-recent sugar was 118 or 119 Continue current medications at current doses

## 2019-06-19 NOTE — Assessment & Plan Note (Signed)
Hospitalized 1/21-1/25/21 Received remdesivir, Decadron Required 2 L oxygen via nasal cannula and was weaned off during hospitalization Overall improved since discharge-still has residual cough that is occasionally productive of yellow sputum, shortness of breath and fatigue, but all symptoms have improved and he is doing well at home Continue supportive care

## 2019-06-19 NOTE — Assessment & Plan Note (Signed)
Chronic Taking all of his medications Blood pressure seems to be well controlled at home Continue current medications at current doses

## 2019-06-19 NOTE — Assessment & Plan Note (Signed)
Chronic He had acute worsening of his kidney function when he was admitted there was thought to be more likely progression of his chronic kidney disease Cozaar and Metformin initially held, but restarted upon discharge and he is taking all of his medications He is drinking a good amount of fluids Will recheck kidney function at his appointment next month

## 2019-06-24 ENCOUNTER — Other Ambulatory Visit: Payer: Self-pay

## 2019-06-24 DIAGNOSIS — Z7984 Long term (current) use of oral hypoglycemic drugs: Secondary | ICD-10-CM

## 2019-06-24 DIAGNOSIS — Z87891 Personal history of nicotine dependence: Secondary | ICD-10-CM

## 2019-06-24 DIAGNOSIS — Z7952 Long term (current) use of systemic steroids: Secondary | ICD-10-CM

## 2019-06-24 DIAGNOSIS — U071 COVID-19: Secondary | ICD-10-CM | POA: Diagnosis not present

## 2019-06-24 DIAGNOSIS — K635 Polyp of colon: Secondary | ICD-10-CM

## 2019-06-24 DIAGNOSIS — Z9181 History of falling: Secondary | ICD-10-CM

## 2019-06-24 DIAGNOSIS — N179 Acute kidney failure, unspecified: Secondary | ICD-10-CM

## 2019-06-24 DIAGNOSIS — J9601 Acute respiratory failure with hypoxia: Secondary | ICD-10-CM | POA: Diagnosis not present

## 2019-06-24 DIAGNOSIS — Z8546 Personal history of malignant neoplasm of prostate: Secondary | ICD-10-CM

## 2019-06-24 DIAGNOSIS — I2510 Atherosclerotic heart disease of native coronary artery without angina pectoris: Secondary | ICD-10-CM | POA: Diagnosis not present

## 2019-06-24 DIAGNOSIS — Z8701 Personal history of pneumonia (recurrent): Secondary | ICD-10-CM

## 2019-06-24 DIAGNOSIS — N1832 Chronic kidney disease, stage 3b: Secondary | ICD-10-CM

## 2019-06-24 DIAGNOSIS — Z8673 Personal history of transient ischemic attack (TIA), and cerebral infarction without residual deficits: Secondary | ICD-10-CM

## 2019-06-24 DIAGNOSIS — Z7982 Long term (current) use of aspirin: Secondary | ICD-10-CM

## 2019-06-24 DIAGNOSIS — I509 Heart failure, unspecified: Secondary | ICD-10-CM

## 2019-06-24 DIAGNOSIS — R569 Unspecified convulsions: Secondary | ICD-10-CM

## 2019-06-24 DIAGNOSIS — M1030 Gout due to renal impairment, unspecified site: Secondary | ICD-10-CM

## 2019-06-24 DIAGNOSIS — E1122 Type 2 diabetes mellitus with diabetic chronic kidney disease: Secondary | ICD-10-CM

## 2019-06-24 DIAGNOSIS — K5790 Diverticulosis of intestine, part unspecified, without perforation or abscess without bleeding: Secondary | ICD-10-CM

## 2019-06-24 DIAGNOSIS — I130 Hypertensive heart and chronic kidney disease with heart failure and stage 1 through stage 4 chronic kidney disease, or unspecified chronic kidney disease: Secondary | ICD-10-CM

## 2019-06-24 DIAGNOSIS — I252 Old myocardial infarction: Secondary | ICD-10-CM | POA: Diagnosis not present

## 2019-06-24 DIAGNOSIS — G4733 Obstructive sleep apnea (adult) (pediatric): Secondary | ICD-10-CM

## 2019-06-24 DIAGNOSIS — Z9981 Dependence on supplemental oxygen: Secondary | ICD-10-CM

## 2019-06-24 NOTE — Patient Outreach (Signed)
[facility]) Care Management  06/24/2019  [place] 1945/07/02 [id number]   Telephone Assessment   Outreach attempt #1 to patient. Spoke with patient who denies any acute issues or concerns at present. He reports that he feels like he is getting stronger and better. He had virtual visit with PCP and states ppt went well. He denies any SOB. He voices that he still has an occasional dry cough. He continues to use incentive spirometry. He is working with [facility] someone coming out later today. He states that on the days he does not have therapy he tries to get out and walk around his neighborhood some. He feels like he is getting stronger. Appetite has improved some. Blood sugars WNL for patient. He states cbg this morning was 108. He is monitoring BP and readings have been around the 130s-150s. He denies any RN CM needs or concerns at this time.    Plan: RN CM discussed with patient next outreach within the month of March. Patient gave verbal consent and in agreement with RN CM follow up and timeframe. Patient aware that they may contact RN CM sooner for any issues or concerns.   Enzo Montgomery, RN,BSN,CCM [place] [facility] Telephonic Care Management Coordinator Direct Phone: [phone] Toll Free: [phone] Fax: [phone]

## 2019-06-26 ENCOUNTER — Ambulatory Visit: Payer: Medicare HMO

## 2019-06-26 DIAGNOSIS — I130 Hypertensive heart and chronic kidney disease with heart failure and stage 1 through stage 4 chronic kidney disease, or unspecified chronic kidney disease: Secondary | ICD-10-CM | POA: Diagnosis not present

## 2019-06-26 DIAGNOSIS — N1832 Chronic kidney disease, stage 3b: Secondary | ICD-10-CM | POA: Diagnosis not present

## 2019-06-26 DIAGNOSIS — J9601 Acute respiratory failure with hypoxia: Secondary | ICD-10-CM | POA: Diagnosis not present

## 2019-06-26 DIAGNOSIS — E1122 Type 2 diabetes mellitus with diabetic chronic kidney disease: Secondary | ICD-10-CM | POA: Diagnosis not present

## 2019-06-26 DIAGNOSIS — I509 Heart failure, unspecified: Secondary | ICD-10-CM | POA: Diagnosis not present

## 2019-06-26 DIAGNOSIS — M1030 Gout due to renal impairment, unspecified site: Secondary | ICD-10-CM | POA: Diagnosis not present

## 2019-06-26 DIAGNOSIS — I252 Old myocardial infarction: Secondary | ICD-10-CM | POA: Diagnosis not present

## 2019-06-26 DIAGNOSIS — U071 COVID-19: Secondary | ICD-10-CM | POA: Diagnosis not present

## 2019-06-26 DIAGNOSIS — I2510 Atherosclerotic heart disease of native coronary artery without angina pectoris: Secondary | ICD-10-CM | POA: Diagnosis not present

## 2019-07-01 DIAGNOSIS — I252 Old myocardial infarction: Secondary | ICD-10-CM | POA: Diagnosis not present

## 2019-07-01 DIAGNOSIS — J9601 Acute respiratory failure with hypoxia: Secondary | ICD-10-CM | POA: Diagnosis not present

## 2019-07-01 DIAGNOSIS — M1030 Gout due to renal impairment, unspecified site: Secondary | ICD-10-CM | POA: Diagnosis not present

## 2019-07-01 DIAGNOSIS — N1832 Chronic kidney disease, stage 3b: Secondary | ICD-10-CM | POA: Diagnosis not present

## 2019-07-01 DIAGNOSIS — E1122 Type 2 diabetes mellitus with diabetic chronic kidney disease: Secondary | ICD-10-CM | POA: Diagnosis not present

## 2019-07-01 DIAGNOSIS — I2510 Atherosclerotic heart disease of native coronary artery without angina pectoris: Secondary | ICD-10-CM | POA: Diagnosis not present

## 2019-07-01 DIAGNOSIS — I509 Heart failure, unspecified: Secondary | ICD-10-CM | POA: Diagnosis not present

## 2019-07-01 DIAGNOSIS — U071 COVID-19: Secondary | ICD-10-CM | POA: Diagnosis not present

## 2019-07-01 DIAGNOSIS — I130 Hypertensive heart and chronic kidney disease with heart failure and stage 1 through stage 4 chronic kidney disease, or unspecified chronic kidney disease: Secondary | ICD-10-CM | POA: Diagnosis not present

## 2019-07-03 DIAGNOSIS — N1832 Chronic kidney disease, stage 3b: Secondary | ICD-10-CM | POA: Diagnosis not present

## 2019-07-03 DIAGNOSIS — U071 COVID-19: Secondary | ICD-10-CM | POA: Diagnosis not present

## 2019-07-03 DIAGNOSIS — M1030 Gout due to renal impairment, unspecified site: Secondary | ICD-10-CM | POA: Diagnosis not present

## 2019-07-03 DIAGNOSIS — I252 Old myocardial infarction: Secondary | ICD-10-CM | POA: Diagnosis not present

## 2019-07-03 DIAGNOSIS — E1122 Type 2 diabetes mellitus with diabetic chronic kidney disease: Secondary | ICD-10-CM | POA: Diagnosis not present

## 2019-07-03 DIAGNOSIS — I130 Hypertensive heart and chronic kidney disease with heart failure and stage 1 through stage 4 chronic kidney disease, or unspecified chronic kidney disease: Secondary | ICD-10-CM | POA: Diagnosis not present

## 2019-07-03 DIAGNOSIS — I2510 Atherosclerotic heart disease of native coronary artery without angina pectoris: Secondary | ICD-10-CM | POA: Diagnosis not present

## 2019-07-03 DIAGNOSIS — J9601 Acute respiratory failure with hypoxia: Secondary | ICD-10-CM | POA: Diagnosis not present

## 2019-07-03 DIAGNOSIS — I509 Heart failure, unspecified: Secondary | ICD-10-CM | POA: Diagnosis not present

## 2019-07-05 NOTE — Progress Notes (Deleted)
Cardiology Office Note   Date:  07/05/2019   ID:  Wetzel, Meester 11/29/1945, MRN [id number]  PCP:  [name], MD  Cardiologist: Dr. Marlou Porch, MD  No chief complaint on file.    History of Present Illness: JEROL RUFENER is a 74 y.o. male who presents for 33-monthfollow-up, seen by Dr. [name]  Mr. KKeevenhas a history of severe left ventricular hypertrophy with normal EF, history of PEA arrest, and hypertensive urgency  He was last seen by Dr. [name]12/05/2018 in follow-up for PEA arrest and LVH seen for preoperative risk stratification prior to hernia repair.  He was noted to have some mild shortness of breath which his albuterol inhaler was not helping.  This was chronic, unchanged issue for him.  He continued to walk 3 days/week without anginal symptoms.  Heart rate was noted to be in the upper 40s.  EKG from 02/2019 showed a heart rate of 51 bpm with first-degree AV block.  His Toprol was decreased at that time from 200 mg daily to 100 mg daily with close follow-up to reassess heart rate.  Shortness of breath felt to be possibly related to bradycardia.  Unfortunately, patient was admitted from 06/05/2019 to 06/09/2019 for acute hypoxic respiratory failure secondary to COVID-19 infection.  He initially presented to the ED with dizziness at which time he had a fever, weakness and work-up revealed that he was Covid positive.  He was last seen by his PCP on 06/19/2019 in follow-up at which time he seems to be doing well.  Chronic CKD, Cozaar and Metformin were held    1.  Sinus bradycardia with first-degree AV block: -At last office visit, Toprol decreased from 200 mg p.o. daily to 100 mg p.o. daily with close follow-up -HR today  2.  Hypertensive heart disease without heart failure: -BP stable today at -Denies dizziness, syncope -Continue regimen with Norvasc, Catapres, hydralazine and Toprol -Cozaar currently held secondary to #3  3.  CKD stage III: -Had issues with  worsening renal dysfunction while hospitalized therefore his -Cozaar and Metformin were held   4.  DM2: -Hemoglobin A1c, -Metformin and Amaryl held in the setting of acute on chronic CKD stage III -Repeat creatinine today   Past Medical History:  Diagnosis Date  . Cataract   . Colon polyp 2003   Dr SLyla Son F/U was to be 2008( not completed)  . CVA (cerebral infarction) 2014  . Diabetes mellitus   . Diverticulosis 2003  . Dyspnea    with exertion  . Gout   . Hypertension   . [place]  . OSA (obstructive sleep apnea) 03/27/2018  . Pneumonia 09/29/11   Avelox X 10 days as OP  . [place] cancer ([facility]   . Renal insufficiency   . [place]    not treated for seizure disorder; had a seizure after stroke 2014; no seizure since then    Past Surgical History:  Procedure Laterality Date  . COLONOSCOPY W/ POLYPECTOMY  2003   no F/U ([place] [facility])  . INSERTION OF MESH N/A 07/16/2017   Procedure: INSERTION OF MESH;  Surgeon: [name] MD;  [facility]  Service: General;  Laterality: N/A;  . PEG PLACEMENT  2014  . PEG TUBE REMOVAL  2014  . PROSTATE BIOPSY    . TRACHEOSTOMY  2014   was closed after hospital stay  . VENTRAL HERNIA REPAIR N/A 07/16/2017   Procedure: LAPAROSCOPIC VENTRAL  HERNIA REPAIR WITH MESH;  Surgeon: [name], MD;  Location: [place];  Service: General;  Laterality: N/A;  . wrist aspiration  02/16/12    monosodium urate crystals; Dr Caralyn Guile     Current Outpatient Medications  Medication Sig Dispense Refill  . Accu-Chek FastClix Lancets MISC Use Accu Chek fastclix lancets to check blood sugar twice daily. DX:E11.65 200 each 2  . albuterol (VENTOLIN HFA) 108 (90 Base) MCG/ACT inhaler Inhale 2 puffs into the lungs every 4 (four) hours as needed for wheezing or shortness of breath. 18 g 1  . amLODipine (NORVASC) 10 MG tablet Take 1 tablet (10 mg total) by mouth daily. 90 tablet 0  . aspirin EC 81 MG tablet  Take 1 tablet (81 mg total) by mouth daily.    . Blood Glucose Monitoring Suppl (ACCU-CHEK GUIDE ME) w/Device KIT 1 each by Does not apply route 2 (two) times daily. Use Accu Chek Guide Me device to check blood sugar twice daily.    . cloNIDine (CATAPRES) 0.3 MG tablet TAKE 1 TABLET TWICE DAILY (Patient taking differently: Take 0.3 mg by mouth 2 (two) times daily. ) 180 tablet 1  . colchicine (COLCRYS) 0.6 MG tablet Take 2 tabs once and then one tab one hour later as needed for gout (Patient taking differently: Take 0.6-1.2 mg by mouth 2 (two) times daily as needed (gout flares). ) 60 tablet 1  . dexamethasone (DECADRON) 6 MG tablet Take 1 tablet (6 mg total) by mouth daily. 5 tablet 0  . furosemide (LASIX) 20 MG tablet Take 2 pills x 1 day, then 1 pill daily in the morning (Patient taking differently: Take 20 mg by mouth daily. ) 30 tablet 3  . glimepiride (AMARYL) 1 MG tablet Take 1/2 tab at bedtime (Patient taking differently: Take 0.5 mg by mouth at bedtime. ) 45 tablet 1  . glucose blood (ACCU-CHEK GUIDE) test strip Use Accu Chek guide test strips as instructed to check blood sugar twice daily. DX:E11.65 100 each 2  . guaiFENesin-dextromethorphan (ROBITUSSIN DM) 100-10 MG/5ML syrup Take 10 mLs by mouth every 4 (four) hours as needed for cough. 118 mL 0  . hydrALAZINE (APRESOLINE) 50 MG tablet Take 1 tablet (50 mg total) by mouth 3 (three) times daily. 270 tablet 1  . isosorbide mononitrate (IMDUR) 60 MG 24 hr tablet TAKE 1 TABLET TWICE DAILY (Patient taking differently: Take 60 mg by mouth 2 (two) times daily. ) 180 tablet 1  . losartan (COZAAR) 100 MG tablet TAKE 1 TABLET (100 MG TOTAL) BY MOUTH DAILY. 90 tablet 1  . metFORMIN (GLUCOPHAGE-XR) 750 MG 24 hr tablet Take 1 tablet (750 mg total) by mouth 2 (two) times daily. Take 1 tablet by mouth twice daily. (Patient taking differently: Take 750 mg by mouth 2 (two) times daily. ) 180 tablet 1  . metoprolol succinate (TOPROL-XL) 100 MG 24 hr tablet  Take 1 tablet (100 mg total) by mouth daily. Take with or immediately following a meal. 90 tablet 3   No current facility-administered medications for this visit.    Allergies:   Hydrochlorothiazide and Allopurinol    Social History:  The patient  reports that he quit smoking about 51 years ago. His smoking use included cigarettes. He has a 0.25 pack-year smoking history. He has never used smokeless tobacco. He reports that he does not drink alcohol or use drugs.   Family History:  The patient's family history includes Cancer in his father; Diabetes in his father and  mother; Hypertension in his father and mother; Prostate cancer in his father.    ROS:  Please see the history of present illness. Otherwise, review of systems are positive for none.   All other systems are reviewed and negative.    PHYSICAL EXAM: VS:  There were no vitals taken for this visit. , BMI There is no height or weight on file to calculate BMI.   General: Well developed, well nourished, NAD Skin: Warm, dry, intact  Head: Normocephalic, atraumatic, sclera non-icteric, no xanthomas, clear, moist mucus membranes. Neck: Negative for carotid bruits. No JVD Lungs:Clear to ausculation bilaterally. No wheezes, rales, or rhonchi. Breathing is unlabored. Cardiovascular: RRR with S1 S2. No murmurs, rubs, gallops, or LV heave appreciated. Abdomen: Soft, non-tender, non-distended with normoactive bowel sounds. No hepatomegaly, No rebound/guarding. No obvious abdominal masses. MSK: Strength and tone appear normal for age. 5/5 in all extremities Extremities: No edema. No clubbing or cyanosis. DP/PT pulses 2+ bilaterally Neuro: Alert and oriented. No focal deficits. No facial asymmetry. MAE spontaneously. Psych: Responds to questions appropriately with normal affect.      EKG:  EKG {ACTION; IS/IS IOE:70350093} ordered today. The ekg ordered today demonstrates ***   Recent Labs: 03/05/2019: Pro B Natriuretic peptide (BNP)  1,543.0 06/06/2019: TSH 0.243 06/09/2019: ALT 101; BUN 49; Creatinine, Ser 1.42; Hemoglobin 13.6; Magnesium 2.1; Platelets 233; Potassium 5.0; Sodium 140    Lipid Panel    Component Value Date/Time   CHOL 125 04/30/2019 1144   TRIG 128 06/05/2019 0628   HDL 46.50 04/30/2019 1144   CHOLHDL 3 04/30/2019 1144   VLDL 12.0 04/30/2019 1144   LDLCALC 67 04/30/2019 1144      Wt Readings from Last 3 Encounters:  06/05/19 185 lb (83.9 kg)  05/07/19 185 lb 12.8 oz (84.3 kg)  04/15/19 182 lb 12.8 oz (82.9 kg)      Other studies Reviewed: Additional studies/ records that were reviewed today include: ***. Review of the above records demonstrates: ***   Echo 09/19/12-severe LVH, normal EF.  ECHO 05/30/17 - Echocardiogram continues to demonstrate severe left ventricular hypertrophy, normal ejection fraction. No significant changes when compared to prior study a proximally 5 years ago. In fact, pericardial effusion remains. No evidence of tamponade.   ASSESSMENT AND PLAN:  1.  ***   Current medicines are reviewed at length with the patient today.  The patient {ACTIONS; HAS/DOES NOT HAVE:19233} concerns regarding medicines.  The following changes have been made:  {PLAN; NO CHANGE:13088:s}  Labs/ tests ordered today include: *** No orders of the defined types were placed in this encounter.    Disposition:   FU with *** in {gen number 8-18:299371} {Days to years:10300}  Signed, Kathyrn Drown, NP  07/05/2019 1:57 PM    [facility] [place], [place], [place]  [address] Phone: [phone]; Fax: [phone]

## 2019-07-08 ENCOUNTER — Ambulatory Visit: Payer: Medicare HMO | Admitting: Cardiology

## 2019-07-10 DIAGNOSIS — I252 Old myocardial infarction: Secondary | ICD-10-CM | POA: Diagnosis not present

## 2019-07-10 DIAGNOSIS — I130 Hypertensive heart and chronic kidney disease with heart failure and stage 1 through stage 4 chronic kidney disease, or unspecified chronic kidney disease: Secondary | ICD-10-CM | POA: Diagnosis not present

## 2019-07-10 DIAGNOSIS — I2510 Atherosclerotic heart disease of native coronary artery without angina pectoris: Secondary | ICD-10-CM | POA: Diagnosis not present

## 2019-07-10 DIAGNOSIS — I509 Heart failure, unspecified: Secondary | ICD-10-CM | POA: Diagnosis not present

## 2019-07-10 DIAGNOSIS — J9601 Acute respiratory failure with hypoxia: Secondary | ICD-10-CM | POA: Diagnosis not present

## 2019-07-10 DIAGNOSIS — E1122 Type 2 diabetes mellitus with diabetic chronic kidney disease: Secondary | ICD-10-CM | POA: Diagnosis not present

## 2019-07-10 DIAGNOSIS — M1030 Gout due to renal impairment, unspecified site: Secondary | ICD-10-CM | POA: Diagnosis not present

## 2019-07-10 DIAGNOSIS — N1832 Chronic kidney disease, stage 3b: Secondary | ICD-10-CM | POA: Diagnosis not present

## 2019-07-10 DIAGNOSIS — U071 COVID-19: Secondary | ICD-10-CM | POA: Diagnosis not present

## 2019-07-12 DIAGNOSIS — N1832 Chronic kidney disease, stage 3b: Secondary | ICD-10-CM | POA: Diagnosis not present

## 2019-07-12 DIAGNOSIS — I509 Heart failure, unspecified: Secondary | ICD-10-CM | POA: Diagnosis not present

## 2019-07-12 DIAGNOSIS — I130 Hypertensive heart and chronic kidney disease with heart failure and stage 1 through stage 4 chronic kidney disease, or unspecified chronic kidney disease: Secondary | ICD-10-CM | POA: Diagnosis not present

## 2019-07-12 DIAGNOSIS — M1030 Gout due to renal impairment, unspecified site: Secondary | ICD-10-CM | POA: Diagnosis not present

## 2019-07-12 DIAGNOSIS — I252 Old myocardial infarction: Secondary | ICD-10-CM | POA: Diagnosis not present

## 2019-07-12 DIAGNOSIS — E1122 Type 2 diabetes mellitus with diabetic chronic kidney disease: Secondary | ICD-10-CM | POA: Diagnosis not present

## 2019-07-12 DIAGNOSIS — I2510 Atherosclerotic heart disease of native coronary artery without angina pectoris: Secondary | ICD-10-CM | POA: Diagnosis not present

## 2019-07-12 DIAGNOSIS — U071 COVID-19: Secondary | ICD-10-CM | POA: Diagnosis not present

## 2019-07-12 DIAGNOSIS — J9601 Acute respiratory failure with hypoxia: Secondary | ICD-10-CM | POA: Diagnosis not present

## 2019-07-14 ENCOUNTER — Other Ambulatory Visit: Payer: Self-pay

## 2019-07-15 DIAGNOSIS — U071 COVID-19: Secondary | ICD-10-CM | POA: Diagnosis not present

## 2019-07-15 DIAGNOSIS — I252 Old myocardial infarction: Secondary | ICD-10-CM | POA: Diagnosis not present

## 2019-07-15 DIAGNOSIS — I2510 Atherosclerotic heart disease of native coronary artery without angina pectoris: Secondary | ICD-10-CM | POA: Diagnosis not present

## 2019-07-15 DIAGNOSIS — N1832 Chronic kidney disease, stage 3b: Secondary | ICD-10-CM | POA: Diagnosis not present

## 2019-07-15 DIAGNOSIS — I130 Hypertensive heart and chronic kidney disease with heart failure and stage 1 through stage 4 chronic kidney disease, or unspecified chronic kidney disease: Secondary | ICD-10-CM | POA: Diagnosis not present

## 2019-07-15 DIAGNOSIS — J9601 Acute respiratory failure with hypoxia: Secondary | ICD-10-CM | POA: Diagnosis not present

## 2019-07-15 DIAGNOSIS — I509 Heart failure, unspecified: Secondary | ICD-10-CM | POA: Diagnosis not present

## 2019-07-15 DIAGNOSIS — E1122 Type 2 diabetes mellitus with diabetic chronic kidney disease: Secondary | ICD-10-CM | POA: Diagnosis not present

## 2019-07-15 DIAGNOSIS — M1030 Gout due to renal impairment, unspecified site: Secondary | ICD-10-CM | POA: Diagnosis not present

## 2019-07-16 ENCOUNTER — Other Ambulatory Visit: Payer: Self-pay

## 2019-07-16 NOTE — Patient Outreach (Signed)
[facility]) Care Management  07/16/2019  [place] August 10, 1945 [id number]   Telephone Assessment     Outreach attempt #1 to patient. No answer at present. RN CM left HIPAA compliant voicemail message along with contact info.    Plan: RN CM will make outreach attempt to patient within the month of April if no return call.    Enzo Montgomery, RN,BSN,CCM [place] Management Telephonic Care Management Coordinator Direct Phone: [phone] Toll Free: [phone] Fax: [phone]

## 2019-07-17 ENCOUNTER — Ambulatory Visit: Payer: Self-pay

## 2019-07-17 ENCOUNTER — Other Ambulatory Visit: Payer: Self-pay

## 2019-07-17 NOTE — Patient Outreach (Signed)
[facility]) Care Management  07/17/2019  Corey [name] 1946-03-16 [id number]   Telephone Assessment   Voicemail message received from patient. Return call to patient. Spoke with patient who reports that he has been doing well. He denies any acute issues or concerns at present. He confirms that he has made eye appt for within the next month. He has PCP appt on next week and sess Dr. Dwyane Dee the following week. Spouse continues to take him to appts. He reports cbg this morning was 115. BP was in the 150s. RN CM continued education to pt regarding low sodium diet. He continues to work with [facility]. Patient reports that he got first dose of Pfizer COVID-19 vaccine on 07/08/2019. He has second appt scheduled. He denies any SEs from vaccine except for a mild sore arm which went away. He denies any RN CM needs or concerns at this time.      Plan: RN CM discussed with patient next outreach within the month of April. Patient gave verbal consent and in agreement with RN CM follow up and timeframe. Patient aware that they may contact RN CM sooner for any issues or concerns.   Enzo Montgomery, RN,BSN,CCM [place] Management Telephonic Care Management Coordinator Direct Phone: [phone] Toll Free: [phone] Fax: [phone]

## 2019-07-22 ENCOUNTER — Other Ambulatory Visit: Payer: Self-pay | Admitting: Internal Medicine

## 2019-07-22 DIAGNOSIS — E1122 Type 2 diabetes mellitus with diabetic chronic kidney disease: Secondary | ICD-10-CM | POA: Diagnosis not present

## 2019-07-22 DIAGNOSIS — I2510 Atherosclerotic heart disease of native coronary artery without angina pectoris: Secondary | ICD-10-CM | POA: Diagnosis not present

## 2019-07-22 DIAGNOSIS — M1030 Gout due to renal impairment, unspecified site: Secondary | ICD-10-CM | POA: Diagnosis not present

## 2019-07-22 DIAGNOSIS — I252 Old myocardial infarction: Secondary | ICD-10-CM | POA: Diagnosis not present

## 2019-07-22 DIAGNOSIS — U071 COVID-19: Secondary | ICD-10-CM | POA: Diagnosis not present

## 2019-07-22 DIAGNOSIS — I130 Hypertensive heart and chronic kidney disease with heart failure and stage 1 through stage 4 chronic kidney disease, or unspecified chronic kidney disease: Secondary | ICD-10-CM | POA: Diagnosis not present

## 2019-07-22 DIAGNOSIS — I509 Heart failure, unspecified: Secondary | ICD-10-CM | POA: Diagnosis not present

## 2019-07-22 DIAGNOSIS — N1832 Chronic kidney disease, stage 3b: Secondary | ICD-10-CM | POA: Diagnosis not present

## 2019-07-22 DIAGNOSIS — J9601 Acute respiratory failure with hypoxia: Secondary | ICD-10-CM | POA: Diagnosis not present

## 2019-07-24 ENCOUNTER — Ambulatory Visit: Payer: Medicare HMO | Admitting: Internal Medicine

## 2019-07-28 ENCOUNTER — Ambulatory Visit: Payer: Medicare HMO | Admitting: Internal Medicine

## 2019-07-29 ENCOUNTER — Other Ambulatory Visit: Payer: Medicare HMO

## 2019-08-01 ENCOUNTER — Other Ambulatory Visit (INDEPENDENT_AMBULATORY_CARE_PROVIDER_SITE_OTHER): Payer: Medicare HMO

## 2019-08-01 ENCOUNTER — Other Ambulatory Visit: Payer: Self-pay

## 2019-08-01 DIAGNOSIS — E1165 Type 2 diabetes mellitus with hyperglycemia: Secondary | ICD-10-CM

## 2019-08-01 LAB — COMPREHENSIVE METABOLIC PANEL
ALT: 34 U/L (ref 0–53)
AST: 34 U/L (ref 0–37)
Albumin: 3.5 g/dL (ref 3.5–5.2)
Alkaline Phosphatase: 89 U/L (ref 39–117)
BUN: 25 mg/dL — ABNORMAL HIGH (ref 6–23)
CO2: 26 mEq/L (ref 19–32)
Calcium: 9.8 mg/dL (ref 8.4–10.5)
Chloride: 110 mEq/L (ref 96–112)
Creatinine, Ser: 1.43 mg/dL (ref 0.40–1.50)
GFR: 58.94 mL/min — ABNORMAL LOW (ref >60.00)
Glucose, Bld: 146 mg/dL — ABNORMAL HIGH (ref 70–99)
Potassium: 4.2 mEq/L (ref 3.5–5.1)
Sodium: 141 mEq/L (ref 135–145)
Total Bilirubin: 0.7 mg/dL (ref 0.2–1.2)
Total Protein: 6 g/dL (ref 6.0–8.3)

## 2019-08-01 LAB — HEMOGLOBIN A1C: Hgb A1c MFr Bld: 8.1 % — ABNORMAL HIGH (ref 4.6–6.5)

## 2019-08-04 NOTE — Progress Notes (Signed)
Patient ID: Corey [name], male   DOB: 10/17/1945, 74 y.o.   MRN: [id number]   Reason for Appointment : Followup of Type 2 Diabetes   History of Present Illness          Diagnosis: Type 2 diabetes mellitus, date of diagnosis: 1997      Past history: The patient was initially treated with various oral hypoglycemic drugs and details are not available. About 2009 he was started on insulin and has either taken premixed insulin or Lantus once a day He had difficulty affording his Lantus insulin and had preferred to take premixed insulin Subsequently insulin was tapered off  Recent history:    Oral hypoglycemic drugs: Metformin ER 750 mg twice daily, Amaryl 0.5 mg at bedtime  Current blood sugar patterns and problems:  His A1c is progressively higher 8.1, previously 7.5    His Accu-Chek meter appears to have the wrong time programmed  History is difficult as the does not remember very well  He thinks he is checking blood sugars only in the mornings but has several readings in the evenings likely at bedtime also  Despite starting Amaryl his morning sugars are periodically high  He may have had higher readings in January when he had Covid infection and was given dexamethasone  Fasting readings have been as low as 102 and was 146 in the lab  No hypoglycemia with Amaryl  Highest reading at night is 165 recently  His weight is about the same   Glucose monitoring:  done less than 1 time a day         Glucometer:  Accu-Chek      Blood Glucose readings from download:   PRE-MEAL Fasting Lunch Dinner Bedtime Overall  Glucose range:  102-170    113-165   Mean/median:  135    139  138     Previous readings:  PRE-MEAL Fasting Lunch Dinner Bedtime Overall  Glucose range:  70  108     Mean/median:      82   POST-MEAL PC Breakfast PC Lunch PC Dinner  Glucose range:  86, 87   57-103  Mean/median:    79    Self-care:       Meals: 2-3 meals per day. Egg in am at 9-10  am or grits, rarely sandwich at lunch. His largest meal is at suppertime at about 8 pm.  Avoiding regular soft drinks  Physical activity: exercise: Walking 30 minutes; 3/7  days        Dietician visit: Most recent: 02/2014               Compliance with the medical regimen:  Fair.    Wt Readings from Last 3 Encounters:  08/05/19 183 lb 6.4 oz (83.2 kg)  06/05/19 185 lb (83.9 kg)  05/07/19 185 lb 12.8 oz (84.3 kg)    Lab Results  Component Value Date   HGBA1C 8.1 (H) 08/01/2019   HGBA1C 7.5 (H) 04/30/2019   HGBA1C 6.6 (H) 12/20/2018    Lab Results  Component Value Date   MICROALBUR 2.5 (H) 12/20/2018   LDLCALC 67 04/30/2019   CREATININE 1.43 08/01/2019   Lab on 08/01/2019  Component Date Value Ref Range Status  . Sodium 08/01/2019 141  135 - 145 mEq/L Final  . Potassium 08/01/2019 4.2  3.5 - 5.1 mEq/L Final  . Chloride 08/01/2019 110  96 - 112 mEq/L Final  . CO2 08/01/2019 26  19 - 32 mEq/L Final  .  Glucose, Bld 08/01/2019 146* 70 - 99 mg/dL Final  . BUN 08/01/2019 25* 6 - 23 mg/dL Final  . Creatinine, Ser 08/01/2019 1.43  0.40 - 1.50 mg/dL Final  . Total Bilirubin 08/01/2019 0.7  0.2 - 1.2 mg/dL Final  . Alkaline Phosphatase 08/01/2019 89  39 - 117 U/L Final  . AST 08/01/2019 34  0 - 37 U/L Final  . ALT 08/01/2019 34  0 - 53 U/L Final  . Total Protein 08/01/2019 6.0  6.0 - 8.3 g/dL Final  . Albumin 08/01/2019 3.5  3.5 - 5.2 g/dL Final  . GFR 08/01/2019 58.94* >60.00 mL/min Final  . Calcium 08/01/2019 9.8  8.4 - 10.5 mg/dL Final  . Hgb A1c MFr Bld 08/01/2019 8.1* 4.6 - 6.5 % Final   Glycemic Control Guidelines for People with Diabetes:Non Diabetic:  <6%Goal of Therapy: <7%Additional Action Suggested:  >8%      Allergies as of 08/05/2019      Reactions   Hydrochlorothiazide Other (See Comments)   Gout , uncontrolled diabetes and renal insufficiency   Allopurinol Other (See Comments)   Renal insufficiency      Medication List       Accurate as of August 05, 2019   1:58 PM. If you have any questions, ask your nurse or doctor.        STOP taking these medications   dexamethasone 6 MG tablet Commonly known as: DECADRON Stopped by: Elayne Snare, MD   guaiFENesin-dextromethorphan 100-10 MG/5ML syrup Commonly known as: ROBITUSSIN DM Stopped by: Elayne Snare, MD     TAKE these medications   Accu-Chek FastClix Lancets Misc Use Accu Chek fastclix lancets to check blood sugar twice daily. DX:E11.65   Accu-Chek Guide Me w/Device Kit 1 each by Does not apply route 2 (two) times daily. Use Accu Chek Guide Me device to check blood sugar twice daily.   Accu-Chek Guide test strip Generic drug: glucose blood Use Accu Chek guide test strips as instructed to check blood sugar twice daily. DX:E11.65   albuterol 108 (90 Base) MCG/ACT inhaler Commonly known as: VENTOLIN HFA Inhale 2 puffs into the lungs every 4 (four) hours as needed for wheezing or shortness of breath.   amLODipine 10 MG tablet Commonly known as: NORVASC TAKE 1 TABLET EVERY DAY   aspirin EC 81 MG tablet Take 1 tablet (81 mg total) by mouth daily.   cloNIDine 0.3 MG tablet Commonly known as: CATAPRES TAKE 1 TABLET TWICE DAILY   colchicine 0.6 MG tablet Commonly known as: Colcrys Take 2 tabs once and then one tab one hour later as needed for gout What changed:   how much to take  how to take this  when to take this  reasons to take this  additional instructions   furosemide 20 MG tablet Commonly known as: LASIX Take 2 pills x 1 day, then 1 pill daily in the morning What changed:   how much to take  how to take this  when to take this  additional instructions   glimepiride 1 MG tablet Commonly known as: AMARYL Take 1/2 tab at bedtime What changed:   how much to take  how to take this  when to take this  additional instructions   hydrALAZINE 50 MG tablet Commonly known as: APRESOLINE Take 1 tablet (50 mg total) by mouth 3 (three) times daily.    isosorbide mononitrate 60 MG 24 hr tablet Commonly known as: IMDUR TAKE 1 TABLET TWICE DAILY   losartan 100 MG  tablet Commonly known as: COZAAR TAKE 1 TABLET (100 MG TOTAL) BY MOUTH DAILY.   metFORMIN 750 MG 24 hr tablet Commonly known as: GLUCOPHAGE-XR Take 1 tablet (750 mg total) by mouth 2 (two) times daily. Take 1 tablet by mouth twice daily. What changed: additional instructions   metoprolol succinate 100 MG 24 hr tablet Commonly known as: TOPROL-XL Take 1 tablet (100 mg total) by mouth daily. Take with or immediately following a meal.   pioglitazone 15 MG tablet Commonly known as: Actos Take 1 tablet (15 mg total) by mouth daily. Started by: Elayne Snare, MD       Allergies:  Allergies  Allergen Reactions  . Hydrochlorothiazide Other (See Comments)    Gout , uncontrolled diabetes and renal insufficiency  . Allopurinol Other (See Comments)    Renal insufficiency    Past Medical History:  Diagnosis Date  . Cataract   . Colon polyp 2003   Dr [name]; F/U was to be 2008( not completed)  . CVA (cerebral infarction) 2014  . Diabetes mellitus   . Diverticulosis 2003  . Dyspnea    with exertion  . Gout   . Hypertension   . Myocardial infarction ([place]) 2014  . OSA (obstructive sleep apnea) 03/27/2018  . Pneumonia 09/29/11   Avelox X 10 days as OP  . Prostate cancer ([place])   . Renal insufficiency   . Seizures ([place])    not treated for seizure disorder; had a seizure after stroke 2014; no seizure since then    Past Surgical History:  Procedure Laterality Date  . COLONOSCOPY W/ POLYPECTOMY  2003   no F/U ([place] discussed 12/03/12)  . INSERTION OF MESH N/A 07/16/2017   Procedure: INSERTION OF MESH;  Surgeon: [name], MD;  Location: [facility];  Service: General;  Laterality: N/A;  . PEG PLACEMENT  2014  . PEG TUBE REMOVAL  2014  . PROSTATE BIOPSY    . TRACHEOSTOMY  2014   was closed after hospital stay  . VENTRAL HERNIA REPAIR N/A 07/16/2017   Procedure:  LAPAROSCOPIC VENTRAL HERNIA REPAIR WITH MESH;  Surgeon: [name], MD;  Location: [facility];  Service: General;  Laterality: N/A;  . wrist aspiration  02/16/12    monosodium urate crystals; Dr Caralyn Guile    Family History  Problem Relation Age of Onset  . Hypertension Father   . Prostate cancer Father   . Diabetes Father   . Cancer Father   . Hypertension Mother   . Diabetes Mother   . Stroke Neg Hx   . Heart disease Neg Hx   . Colon cancer Neg Hx     Social History:  reports that he quit smoking about 51 years ago. His smoking use included cigarettes. He has a 0.25 pack-year smoking history. He has never used smokeless tobacco. He reports that he does not drink alcohol or use drugs.    Review of Systems   Lipids: Normal levels and  not on a statin drug  Lab Results  Component Value Date   CHOL 125 04/30/2019   HDL 46.50 04/30/2019   LDLCALC 67 04/30/2019   TRIG 128 06/05/2019   CHOLHDL 3 04/30/2019    Eye exams: Last done in 1/20 without retinopathy     He has mild renal insufficiency of unclear etiology with variable creatinine levels as below:  Lab Results  Component Value Date   CREATININE 1.43 08/01/2019   CREATININE 1.42 (H) 06/09/2019   CREATININE 1.42 (H) 06/08/2019  HYPERTENSION: Currently on losartan, amlodipine 5, clonidine 0.3 mg and hydralazine as well as metoprolol  He has been followed by PCP for this  BP Readings from Last 3 Encounters:  08/05/19 130/70  06/09/19 134/79  05/07/19 134/82    Foot exam done in 3/20 with PCP  Physical Examination:  BP 130/70 (BP Location: Left Arm, Patient Position: Sitting, Cuff Size: Normal)   Pulse (!) 58   Ht _0  (1.676 m)   Wt 183 lb 6.4 oz (83.2 kg)   SpO2 97%   BMI 29.60 kg/m     ASSESSMENT/PLAN:  Diabetes type 2, uncontrolled   See history of present illness for discussion of current diabetes management, blood sugar patterns and problems identified  His A1c is higher at 8.1  He is  on metformin and 0.5 mg Amaryl Although his blood sugars are near normal at times he still has some high readings around 170 in the morning His A1c may be possibly higher with hypoglycemia in January after having Covid infection and use of dexamethasone  He is unlikely to take a brand-name medications and likely can do better if he uses Rybelsus instead of Amaryl Since his blood sugars are not consistently high may consider trying Actos initially, does not appear to have any contraindication to this and will start with 15 mg He can call if he has any ankle edema but currently already on Lasix  Follow-up in 3 months with fructosamine also Discussed blood sugar targets in the morning and after meals  He will start using the Accu-Chek guide meter and prescription will be sent  HYPERTENSION: Well controlled and low orthostatic symptoms with multiple medications including clonidine  Patient Instructions  Check blood sugars on waking up 3-4 days a week  Also check blood sugars about 2 hours after meals and do this after different meals by rotation  Recommended blood sugar levels on waking up are 90-130 and about 2 hours after meal is 130-170  Please bring your blood sugar monitor to each visit, thank you      Elayne Snare 08/05/2019, 1:58 PM

## 2019-08-05 ENCOUNTER — Other Ambulatory Visit: Payer: Self-pay

## 2019-08-05 ENCOUNTER — Ambulatory Visit: Payer: Medicare HMO | Admitting: Endocrinology

## 2019-08-05 ENCOUNTER — Encounter: Payer: Self-pay | Admitting: Endocrinology

## 2019-08-05 VITALS — BP 130/70 | HR 58 | Ht 66.0 in | Wt 183.4 lb

## 2019-08-05 DIAGNOSIS — I10 Essential (primary) hypertension: Secondary | ICD-10-CM | POA: Diagnosis not present

## 2019-08-05 DIAGNOSIS — E1165 Type 2 diabetes mellitus with hyperglycemia: Secondary | ICD-10-CM

## 2019-08-05 MED ORDER — ACCU-CHEK GUIDE VI STRP: ORAL_STRIP | 2 refills | Status: DC

## 2019-08-05 MED ORDER — PIOGLITAZONE HCL 15 MG PO TABS: 15.0000 mg | ORAL_TABLET | Freq: Every day | ORAL | 2 refills | Status: DC

## 2019-08-05 NOTE — Patient Instructions (Signed)
Check blood sugars on waking up 3-4 days a week  Also check blood sugars about 2 hours after meals and do this after different meals by rotation  Recommended blood sugar levels on waking up are 90-130 and about 2 hours after meal is 130-170  Please bring your blood sugar monitor to each visit, thank you

## 2019-08-07 ENCOUNTER — Other Ambulatory Visit: Payer: Self-pay | Admitting: Internal Medicine

## 2019-08-07 ENCOUNTER — Ambulatory Visit: Payer: Self-pay

## 2019-08-07 DIAGNOSIS — G4733 Obstructive sleep apnea (adult) (pediatric): Secondary | ICD-10-CM | POA: Diagnosis not present

## 2019-08-12 ENCOUNTER — Other Ambulatory Visit: Payer: Self-pay | Admitting: Internal Medicine

## 2019-08-19 ENCOUNTER — Other Ambulatory Visit: Payer: Self-pay

## 2019-08-19 NOTE — Patient Outreach (Signed)
[facility]) Care Management  08/19/2019  [place] 1946/05/02 [id number]   Telephone Assessment    Outreach attempt #1 to patient. No answer at present.      Plan: RN CM will make outreach attempt to patient within the month of May if no return call.   Enzo Montgomery, RN,BSN,CCM [place] Management Telephonic Care Management Coordinator Direct Phone: [phone] Toll Free: [phone] Fax: [phone]

## 2019-08-20 ENCOUNTER — Other Ambulatory Visit: Payer: Self-pay | Admitting: Endocrinology

## 2019-08-20 ENCOUNTER — Other Ambulatory Visit: Payer: Self-pay | Admitting: Internal Medicine

## 2019-08-21 ENCOUNTER — Ambulatory Visit: Payer: Self-pay

## 2019-09-16 ENCOUNTER — Telehealth: Payer: Self-pay | Admitting: Cardiology

## 2019-09-16 NOTE — Telephone Encounter (Signed)
Spoke with patient and his wife.  No symptoms currently. Pt reports on his walks, over the last week, he gets SOB and chest discomfort.  Stops walking and symptoms ease down.  Wife notices he is SOB while talking.  His legs are swollen.  He does not have any lasix at home.  According to med list he takes 20 mg daily.  Not sure how long he has been out of it.  Toprol XL reduced to 100 mg at last ov due to HR.  BP during the call 202-123, HR 64 Pt has not taken ANY medicines today yet. Instructed to take his cardiac medications now and recheck BP in 2-3 hours.  If not down or if develops any neurological symptoms he needs to be seen in the ED.  Adv to take it easy, no more walks until after seen in cardiology.   If symptoms return or worsen he should be seen in ED.  Adv I would send refill for lasix if recommended by Dr. Marlou Porch prior to Thursday's clinic visit.

## 2019-09-16 NOTE — Telephone Encounter (Signed)
Pt c/o Shortness Of Breath: STAT if SOB developed within the last 24 hours or pt is noticeably SOB on the phone  1. Are you currently SOB (can you hear that pt is SOB on the phone)? no  2. How long have you been experiencing SOB? About a week  3. Are you SOB when sitting or when up moving around? Up moving around. States he gets SOB when he goes on a walk.  4. Are you currently experiencing any other symptoms? Some pain in his chest every now and then.   Pt c/o of Chest Pain: STAT if CP now or developed within 24 hours  1. Are you having CP right now? no  2. Are you experiencing any other symptoms (ex. SOB, nausea, vomiting, sweating)? SOB  3. How long have you been experiencing CP? About a week now, whenever the SOB started  4. Is your CP continuous or coming and going? Coming and going  5. Have you taken Nitroglycerin? No   Patient scheduled to see Kathyrn Drown on 09/18/19 ?

## 2019-09-16 NOTE — Progress Notes (Signed)
Cardiology Office Note   Date:  09/16/2019   ID:  Corey [name], DOB 1946/02/05, MRN [id number]  PCP:  [name], MD  Cardiologist: Dr. Marlou Porch, MD  No chief complaint on file.     History of Present Illness: Corey [name] is a 74 y.o. male who presents for evaluation of bradycardia/shortness of breath, seen by Dr. Marlou Porch.  Corey [name] has a history of severe left ventricular hypertrophy with normal LVEF and prolonged hospital stay in the setting of PEA arrest with infection and hypertensive urgency.   He was last seen by Dr. Marlou Porch 04/15/2019 at which time he continued to have chronic shortness of breath with activity which have been unchanged for quite some time. Inhalers not to help symptoms. Continued to walk three days/week without significant issues. Heart rate at last OV was in the upper 40s therefore his Toprol was decreased.  Bradycardia felt to be contributing to his shortness of breath.  Last echocardiogram from 05/30/2017 with normal LVEF at 60 to 65% with severe concentric hypertrophy with no regional wall motion abnormalities. Did have Doppler parameters consistent with restrictive left ventricular relaxation with G3 DD, severely dilated LA and small to moderate circumferential pericardial effusion with no evidence of tamponade.   Plan for repeat echocardiogram to evaluate pericardial effusion. Reports today that he was hospitalized in March with COVID. He was never intubated but feels that he is having lasting respiratory affects with increased SOB with exertion. He has not followed with his PCP since discharge. Reports he is still able to walk several times a week but will have to stop and rest. Is having mild chest pain with his walking and will sometimes get dizzy. Both will resolve when he stops walking. Will not have these symptoms while doing work around his house.   Difficult to determine is his symptoms are in relation to previous COVID infection. Discussed  following with an echocardiogram>>worisome for LVOT. Will also check Lexiscan stress test however has no prior hx of CAD. BP continues to be elevated today. HR is better at 54bpm. Does not know what this is at home.    Past Medical History:  Diagnosis Date  . Cataract   . Colon polyp 2003   Dr [name]; F/U was to be 2008( not completed)  . CVA (cerebral infarction) 2014  . Diabetes mellitus   . Diverticulosis 2003  . Dyspnea    with exertion  . Gout   . Hypertension   . Myocardial infarction ([place]) 2014  . OSA (obstructive sleep apnea) 03/27/2018  . Pneumonia 09/29/11   Avelox X 10 days as OP  . Prostate cancer ([place])   . Renal insufficiency   . Seizures ([place])    not treated for seizure disorder; had a seizure after stroke 2014; no seizure since then    Past Surgical History:  Procedure Laterality Date  . COLONOSCOPY W/ POLYPECTOMY  2003   no F/U ([place] discussed 12/03/12)  . INSERTION OF MESH N/A 07/16/2017   Procedure: INSERTION OF MESH;  Surgeon: [name], MD;  Location: [place];  Service: General;  Laterality: N/A;  . PEG PLACEMENT  2014  . PEG TUBE REMOVAL  2014  . PROSTATE BIOPSY    . TRACHEOSTOMY  2014   was closed after hospital stay  . VENTRAL HERNIA REPAIR N/A 07/16/2017   Procedure: LAPAROSCOPIC VENTRAL HERNIA REPAIR WITH MESH;  Surgeon: [name], MD;  Location: [place];  Service: General;  Laterality: N/A;  . wrist aspiration  02/16/12    monosodium urate crystals; Dr Caralyn Guile     Current Outpatient Medications  Medication Sig Dispense Refill  . Accu-Chek FastClix Lancets MISC Use Accu Chek fastclix lancets to check blood sugar twice daily. DX:E11.65 200 each 2  . albuterol (VENTOLIN HFA) 108 (90 Base) MCG/ACT inhaler Inhale 2 puffs into the lungs every 4 (four) hours as needed for wheezing or shortness of breath. 18 g 1  . amLODipine (NORVASC) 10 MG tablet TAKE 1 TABLET EVERY DAY 90 tablet 0  . aspirin EC 81 MG tablet Take 1 tablet (81 mg total) by  mouth daily.    . Blood Glucose Monitoring Suppl (ACCU-CHEK GUIDE ME) w/Device KIT 1 each by Does not apply route 2 (two) times daily. Use Accu Chek Guide Me device to check blood sugar twice daily.    . cloNIDine (CATAPRES) 0.3 MG tablet TAKE 1 TABLET TWICE DAILY 180 tablet 1  . colchicine (COLCRYS) 0.6 MG tablet Take 2 tabs once and then one tab one hour later as needed for gout (Patient taking differently: Take 0.6-1.2 mg by mouth 2 (two) times daily as needed (gout flares). ) 60 tablet 1  . furosemide (LASIX) 20 MG tablet Take 2 pills x 1 day, then 1 pill daily in the morning (Patient taking differently: Take 20 mg by mouth daily. ) 30 tablet 3  . glimepiride (AMARYL) 1 MG tablet Take 1/2 tab at bedtime (Patient taking differently: Take 0.5 mg by mouth at bedtime. ) 45 tablet 1  . glucose blood (ACCU-CHEK GUIDE) test strip Use Accu Chek guide test strips as instructed to check blood sugar twice daily. DX:E11.65 200 each 2  . hydrALAZINE (APRESOLINE) 50 MG tablet TAKE 1 TABLET THREE TIMES DAILY 270 tablet 0  . isosorbide mononitrate (IMDUR) 60 MG 24 hr tablet TAKE 1 TABLET TWICE DAILY 180 tablet 0  . losartan (COZAAR) 100 MG tablet TAKE 1 TABLET EVERY DAY 90 tablet 1  . metFORMIN (GLUCOPHAGE-XR) 750 MG 24 hr tablet TAKE 1 TABLET TWICE DAILY 180 tablet 1  . metoprolol succinate (TOPROL-XL) 100 MG 24 hr tablet Take 1 tablet (100 mg total) by mouth daily. Take with or immediately following a meal. 90 tablet 3  . pioglitazone (ACTOS) 15 MG tablet Take 1 tablet (15 mg total) by mouth daily. 30 tablet 2   No current facility-administered medications for this visit.    Allergies:   Hydrochlorothiazide and Allopurinol    Social History:  The patient  reports that he quit smoking about 51 years ago. His smoking use included cigarettes. He has a 0.25 pack-year smoking history. He has never used smokeless tobacco. He reports that he does not drink alcohol or use drugs.   Family History:  The patient's  family history includes Cancer in his father; Diabetes in his father and mother; Hypertension in his father and mother; Prostate cancer in his father.    ROS:  Please see the history of present illness.   Otherwise, review of systems are positive for none. All other systems are reviewed and negative.    PHYSICAL EXAM: VS:  There were no vitals taken for this visit. , BMI There is no height or weight on file to calculate BMI.   General: Well developed, well nourished, NAD Skin: Warm, dry, intact  Head: Normocephalic, atraumatic, sclera non-icteric, no xanthomas, clear, moist mucus membranes. Neck: Negative for carotid bruits. No JVD Lungs:Clear to ausculation bilaterally. No wheezes, rales, or rhonchi.  Breathing is unlabored. Cardiovascular: RRR with S1 S2. No murmurs, rubs, gallops, or LV heave appreciated. Extremities: No edema. Radial pulses 2+ bilaterally Neuro: Alert and oriented. No focal deficits. No facial asymmetry. MAE spontaneously. Psych: Responds to questions appropriately with normal affect.     EKG:  EKG is ordered today. The ekg ordered today demonstrates NSR with 1st degree AV block and no acute changes. Low voltage QRS in lead 1   Recent Labs: 03/05/2019: Pro B Natriuretic peptide (BNP) 1,543.0 06/06/2019: TSH 0.243 06/09/2019: Hemoglobin 13.6; Magnesium 2.1; Platelets 233 08/01/2019: ALT 34; BUN 25; Creatinine, Ser 1.43; Potassium 4.2; Sodium 141    Lipid Panel    Component Value Date/Time   CHOL 125 04/30/2019 1144   TRIG 128 06/05/2019 0628   HDL 46.50 04/30/2019 1144   CHOLHDL 3 04/30/2019 1144   VLDL 12.0 04/30/2019 1144   LDLCALC 67 04/30/2019 1144      Wt Readings from Last 3 Encounters:  08/05/19 183 lb 6.4 oz (83.2 kg)  06/05/19 185 lb (83.9 kg)  05/07/19 185 lb 12.8 oz (84.3 kg)    Other studies Reviewed: Additional studies/ records that were reviewed today include:  Review of the above records demonstrates:   Echocardiogram  05/30/2017:  Study Conclusions   - Left ventricle: The cavity size was normal. There was severe  concentric hypertrophy - wall thickness of 2.0 cm - consider HCM  or infiltrative cardiomyopathy. Systolic function was normal. The  estimated ejection fraction was in the range of 60% to 65%. Wall  motion was normal; there were no regional wall motion  abnormalities. Doppler parameters are consistent with restrictive  left ventricular relaxation (grade 3 diastolic dysfunction). The  E/A ratio is 1.5. The E/e&' ratio is >20, suggesting markedly  elevated LV filling pressure.  - Aorta: Aortic root dimension: 39 mm (ED).  - Aortic root: The aortic root is mildly dilated.  - Left atrium: Severely dilated.  - Right ventricle: RVH. Normal size and systolic function.  - Right atrium: Mildly dilated.  - Tricuspid valve: There was trivial regurgitation.  - Pulmonic valve: Mildly thickened leaflets. There was mild  regurgitation.  - Pulmonary arteries: PA peak pressure: 17 mm Hg (S).  - Inferior vena cava: The vessel was dilated. The respirophasic  diameter changes were blunted (< 50%), consistent with elevated  central venous pressure.  - Pericardium, extracardiac: Small to moderate cirumferential  pericardial effusion. Clinical correlation for tamponade  physiology is always recommended.   Impressions:   - Compared to a study in 2015, the LV wall thickening is worse,  measuring 2.0 cm -findings in a patient with renal insufficiency,  may suggest an infiltrative cardiomyopathy such as amyloidosis.  There is restrictive (Grade 3) diastolic dysfunction wtih high LV  filling pressure. A persistent small to moderate-sized  circumferential pericardial effusion is noted - there is RVH and  therefore tamponade physiology cannot be excluded. Consider  cardiac MRI to evalute myocardium further, however, renal  function may not allow contrast.    ASSESSMENT  AND PLAN:  1.  Shortness of breath: -Felt to be chronic and previously related to bradycardia however worrisome given more recent change. Likely secondary to COVID infection 07/2019.  -Prior echo from 2019 with normal LVEF at 60 to 65% with severe concentric hypertrophy with no regional wall motion abnormalities. Did have Doppler parameters consistent with restrictive left ventricular relaxation with G3 DD, severely dilated LA and small to moderate circumferential pericardial effusion with no evidence of tamponade. -Recheck  echocardiogram -Appears euvolemic on exam today  2. Chest pain with dizziness: -Reports exertional SOB with associated chest pain and mild dizziness>>>worrisome given his prior hx of severe concentric hypertrophy. Reports symptoms occur with walking and will decrease with rest. Could also be related to recent COVID infection for which he was hospitalized for one week. Has not followed with PCP for this>>recommended  -EKG with no acute changes  -No prior coronary evaluation -No bruit on exam however may need carotid dopplers if normal echo and stress testing   -Will obtain Lexiscan stress test with close follow up  2.  Sinus bradycardia with first-degree AV block: -Heart rate at last OV in the upper 40s therefore metoprolol decreased from 200 mg to 100 mg daily with plans for close follow-up however it does not appear that this occurred -HR improved on EKG today at 52bpm -Low suspicion that dizziness is related to bradycardia   3.  Hypertensive heart disease: -Elevated, 150/100 -On many antihypertensives -Will increase hydralazine to 20m TID and follow after testing described above    Current medicines are reviewed at length with the patient today.  The patient does not have concerns regarding medicines.  The following changes have been made:  Increase hydralazine to 758mPO TID  Labs/ tests ordered today include: Echo, Lexiscan  No orders of the defined types were  placed in this encounter.   Disposition:   FU with Dr. SkMarlou Porchn 4 weeks  Signed, JiKathyrn DrownNP  09/16/2019 1:19 PM    [place]  [id number]: [phone]: [phone]

## 2019-09-18 ENCOUNTER — Telehealth: Payer: Self-pay | Admitting: Cardiology

## 2019-09-18 ENCOUNTER — Other Ambulatory Visit: Payer: Self-pay

## 2019-09-18 ENCOUNTER — Encounter: Payer: Self-pay | Admitting: Cardiology

## 2019-09-18 ENCOUNTER — Telehealth: Payer: Self-pay | Admitting: Internal Medicine

## 2019-09-18 ENCOUNTER — Ambulatory Visit: Payer: Medicare HMO | Admitting: Cardiology

## 2019-09-18 VITALS — BP 150/100 | HR 54 | Ht 66.0 in | Wt 187.0 lb

## 2019-09-18 DIAGNOSIS — R42 Dizziness and giddiness: Secondary | ICD-10-CM | POA: Diagnosis not present

## 2019-09-18 DIAGNOSIS — I208 Other forms of angina pectoris: Secondary | ICD-10-CM | POA: Diagnosis not present

## 2019-09-18 DIAGNOSIS — R079 Chest pain, unspecified: Secondary | ICD-10-CM

## 2019-09-18 DIAGNOSIS — I119 Hypertensive heart disease without heart failure: Secondary | ICD-10-CM

## 2019-09-18 DIAGNOSIS — R001 Bradycardia, unspecified: Secondary | ICD-10-CM | POA: Diagnosis not present

## 2019-09-18 DIAGNOSIS — I440 Atrioventricular block, first degree: Secondary | ICD-10-CM

## 2019-09-18 MED ORDER — HYDRALAZINE HCL 50 MG PO TABS: 75.0000 mg | ORAL_TABLET | Freq: Three times a day (TID) | ORAL | 0 refills | Status: DC

## 2019-09-18 NOTE — Telephone Encounter (Signed)
New message:   1.Medication Requested: furosemide (LASIX) 20 MG tablet  2. Pharmacy (Name, Street, [place]): [name] [place] - [address] 3. On Med List: yes  4. Last Visit with PCP: 06/19/19  5. Next visit date with PCP: None   Agent: Please be advised that RX refills may take up to 3 business days. We ask that you follow-up with your pharmacy.

## 2019-09-18 NOTE — Patient Instructions (Signed)
Medication Instructions:   Your physician has recommended you make the following change in your medication:   1) Increase Hydralazine to 75 mg, 1.5 tablets by mouth three times a day  *If you need a refill on your cardiac medications before your next appointment, please call your pharmacy*  Lab Work:  None ordered today  Testing/Procedures:  Your physician has requested that you have an echocardiogram. Echocardiography is a painless test that uses sound waves to create images of your heart. It provides your doctor with information about the size and shape of your heart and how well your heart's chambers and valves are working. This procedure takes approximately one hour. There are no restrictions for this procedure.  Your physician has requested that you have a lexiscan myoview. For further information please visit [email or web address]. Please follow instruction sheet, as given.  Follow-Up: At [facility], you and your health needs are our priority.  As part of our continuing mission to provide you with exceptional heart care, we have created designated Provider Care Teams.  These Care Teams include your primary Cardiologist (physician) and Advanced Practice Providers (APPs -  Physician Assistants and Nurse Practitioners) who all work together to provide you with the care you need, when you need it.  We recommend signing up for the patient portal called "MyChart".  Sign up information is provided on this After Visit Summary.  MyChart is used to connect with patients for Virtual Visits (Telemedicine).  Patients are able to view lab/test results, encounter notes, upcoming appointments, etc.  Non-urgent messages can be sent to your provider as well.   To learn more about what you can do with MyChart, go to [email or web address].    Your next appointment:    Follow up with Dr. Marlou Porch after [facility] Adam and echocardiogram

## 2019-09-18 NOTE — Telephone Encounter (Signed)
Called pt to inform him that his PCP Dr. Billey Gosling refills this medication and that he needed to give their office a call to get this medication refilled. I advised the pt that if he has any other problems, questions or concerns to give our office a call back. Pt verbalized understanding.

## 2019-09-18 NOTE — Telephone Encounter (Signed)
*  STAT* If patient is at the pharmacy, call can be transferred to refill team.   1. Which medications need to be refilled? (please list name of each medication and dose if known)  furosemide (LASIX) 20 MG tablet  2. Which pharmacy/location (including street and city if local pharmacy) is medication to be sent to?  [facility] [place] - [address]  3. Do they need a 30 day or 90 day supply? 90  Pt forgot to ask for a refill when he was in the office today.  Pt is out of medication

## 2019-09-19 NOTE — Telephone Encounter (Signed)
furosemide addressed 5/6

## 2019-09-22 MED ORDER — FUROSEMIDE 20 MG PO TABS: ORAL_TABLET | ORAL | 1 refills | Status: DC

## 2019-09-22 NOTE — Telephone Encounter (Signed)
Rx sent 

## 2019-09-22 NOTE — Telephone Encounter (Signed)
New Message:   Pt is calling to follow up on his medication refill. Please advise.

## 2019-09-26 ENCOUNTER — Other Ambulatory Visit: Payer: Self-pay | Admitting: Internal Medicine

## 2019-09-29 ENCOUNTER — Other Ambulatory Visit: Payer: Self-pay

## 2019-09-29 NOTE — Patient Outreach (Signed)
[facility]) Care Management  09/29/2019  Davidson 26-Feb-1946 [id number]   Telephone Assessment    Outreach attempt to patient. No answer. RN CM left HIPAA compliant voicemail message along with contact info.      Plan: RN CM will make outreach attempt to patient within the month of June if no return call from patient.   Enzo Montgomery, RN,BSN,CCM [facility] Telephonic Care Management Coordinator Direct Phone: [phone] Toll Free: [phone] Fax: [phone]

## 2019-09-30 ENCOUNTER — Ambulatory Visit: Payer: Self-pay

## 2019-09-30 ENCOUNTER — Other Ambulatory Visit: Payer: Self-pay

## 2019-09-30 NOTE — Patient Outreach (Signed)
[facility]) Care Management  09/30/2019  Corey [name] 27-Jul-1945 [id number]   Telephone Assessment   Voicemail message received from patient. Return call placed to patient. Spoke with patient. He reports that he is doing fairly well. He shares that he has beenevaluated by MD for some SOB. He voices that SOB happens while he is out exercising/walking primarily. Patient reports walking 9mles every M,W,F. He states that normally after a mile and a half he gets SOB. RN CM discussed with patient importance of activity pacing and to possibly split up walking and decease distance. He reports he has appt with cardiologist on next week and will have some cardiac testing done. He denies any chest pain or other sxs. He reports that his BP continues to fluctuate up and down and runs higher in the mornings prior to taking meds. BP this morning was 148/78. Patient voices adhering to med regimen. However, he admits that it remains hard for him to stick to low salt diet. He reports he usually adds salt to foods prior to even tasting it. RN CM provided verbal education to patient on low salt diet and alternatives and will send out some educational written info to patient as well. He voiced understanding and will try to do better. He states he is due to make [id number] with PCP and will call office. He denies any other RN CM needs or concerns at this time.   Medications Reviewed Today    Reviewed by FlHayden PedroRN (Registered Nurse) on 09/30/19 at 1146  Med List Status: <None>  Medication Order Taking? Sig Documenting Provider Last Dose Status Informant  Accu-Chek FastClix Lancets MISC 29786767209o Use Accu Chek fastclix lancets to check blood sugar twice daily. DX:E11.65 KuElayne SnareMD Taking Active Self  albuterol (VENTOLIN HFA) 108 (90 Base) MCG/ACT inhaler 28470962836o Inhale 2 puffs into the lungs every 4 (four) hours as needed for wheezing or shortness of breath. Tower,  MaWynelle FannyMD Taking Active Self  amLODipine (NORVASC) 10 MG tablet 30629476546TAKE 1 TABLET EVERY DAY Burns, StClaudina LickMD  Active   aspirin EC 81 MG tablet 20503546568o Take 1 tablet (81 mg total) by mouth daily. BuBinnie RailMD Taking Active Self  Blood Glucose Monitoring Suppl (ACCU-CHEK GUIDE ME) w/Device KIT 29127517001o 1 each by Does not apply route 2 (two) times daily. Use Accu Chek Guide Me device to check blood sugar twice daily. [provider] Taking Active Self  cloNIDine (CATAPRES) 0.3 MG tablet 29749449675o TAKE 1 TABLET TWICE DAILY Burns, StClaudina LickMD Taking Active   colchicine (COLCRYS) 0.6 MG tablet 27916384665o Take 2 tabs once and then one tab one hour later as needed for gout BuBinnie RailMD Taking Active Self  furosemide (LASIX) 20 MG tablet 30993570177Take 1 pill daily in the morning BuBinnie RailMD  Active   glimepiride (AMARYL) 1 MG tablet 29939030092o Take 1/2 tab at bedtime KuElayne SnareMD Taking Active Self           Med Note (RLanna Poche   Thu Sep 18, 2019 11:03 AM)    glucose blood (ACCU-CHEK GUIDE) test strip 29330076226o Use Accu Chek guide test strips as instructed to check blood sugar twice daily. DXJF:H54.56uElayne SnareMD Taking Active   hydrALAZINE (APRESOLINE) 50 MG tablet [id number] 1.5 tablets (75 mg total) by mouth 3 (three) times daily. McTommie RaymondNP  Active   isosorbide mononitrate (IMDUR) 60 MG 24 hr tablet [id number] No TAKE 1 TABLET TWICE DAILY Burns, [name], MD Taking Active   losartan (COZAAR) 100 MG tablet [id number] No TAKE 1 TABLET EVERY DAY Burns, [name], MD Taking Active   metFORMIN (GLUCOPHAGE-XR) 750 MG 24 hr tablet [id number] No TAKE 1 TABLET TWICE DAILY Elayne Snare, MD Taking Active   metoprolol succinate (TOPROL-XL) 100 MG 24 hr tablet [id number] No Take 1 tablet (100 mg total) by mouth daily. Take with or immediately following a meal. Jerline Pain, MD Taking Expired 07/14/19 2359 Self           Med Note  Lanna Poche R   Thu Sep 18, 2019 11:03 AM)    pioglitazone (ACTOS) 15 MG tablet [id number] No Take 1 tablet (15 mg total) by mouth daily. Elayne Snare, MD Taking Active   Med List Note Skeet Simmer [facility] 01/12/13 9381): a          [facility] CM Care Plan Problem One     Most Recent Value  Care Plan Problem One  Knowledge deficit related to disease process and mgmt of HTN  Role Documenting the Problem One  Care Management Telephonic Coordinator  Care Plan for Problem One  Active  [facility] Long Term Goal   Patient will report montioring BP daily and keeping log consistently over the next 60 days.  [facility] Long Term Goal Start Date  06/12/19  [facility] Long Term Goal Met Date  09/30/19  [facility] CM Short Term Goal #1   Patient will report adhering to low salt diet at least 75-100% of the time over the next 30 day.  [facility] CM Short Term Goal #1 Start Date  06/12/19  Interventions for Short Term Goal #1  RNCM provided education and reinforcement to pt on low salt diet, salt alternatives and other helathy eating habits    [facility] CM Care Plan Problem Two     Most Recent Value  Care Plan Problem Two  Activity intolerance-patient experiencing intermittent periods of SOB  Role Documenting the Problem Two  Care Management Telephonic Coordinator  Care Plan for Problem Two  Active  Interventions for Problem Two Long Term Goal   RNCM reviewed and assessed SOB., discussed ways to manage SOB. RNCM confirmed pt aware of s/s of worsening condition and when to seek medical attention.  [facility] Long Term Goal  Patient will report a decrease in the amountof SOB episodes over the next 31 days.  [facility] Long Term Goal Start Date  09/30/19  [facility] CM Short Term Goal #1   Patient will complete cardiac workup and MD appt over the next 30 days.  [facility] CM Short Term Goal #1 Start Date  09/30/19  Interventions for Short Term Goal #2   RNCM discussed importance of follow up and confirmed appt in place and no barriers to getting to appt.    [facility] CM Care  Plan Problem Three     Most Recent Value  Care Plan Problem Three  Decreased appetite and loss of taste.  Role Documenting the Problem Three  Care Management Telephonic Coordinator  Care Plan for Problem Three  Active  [facility] Long Term Goal   Patient will mainatin wgt and rpeort no wgt lost over the next 60 days.  [facility] Long Term Goal Start Date  06/12/19  [facility] Long Term Goal Met Date  09/30/19      Plan: RN CM will send educational information to patient on HTN  and low salt diet. RN CM will send quarterly update to PCP.   RN CM discussed with patient next outreach within the month of June. Patient gave verbal consent and in agreement with RN CM follow up and timeframe. Patient aware that they may contact RN CM sooner for any issues or concerns.   Enzo Montgomery, RN,BSN,CCM [place] Management Telephonic Care Management Coordinator Direct Phone: [phone] Toll Free: [phone] Fax: [phone]

## 2019-10-04 ENCOUNTER — Other Ambulatory Visit: Payer: Self-pay | Admitting: Internal Medicine

## 2019-10-07 ENCOUNTER — Telehealth (HOSPITAL_COMMUNITY): Payer: Self-pay | Admitting: *Deleted

## 2019-10-07 NOTE — Telephone Encounter (Signed)
Patient given detailed instructions per Myocardial Perfusion Study Information Sheet for the test on 10/10/19 at 10:15. Patient notified to arrive 15 minutes early and that it is imperative to arrive on time for appointment to keep from having the test rescheduled.  If you need to cancel or reschedule your appointment, please call the office within 24 hours of your appointment. . Patient verbalized understanding.Corey [name]

## 2019-10-10 ENCOUNTER — Other Ambulatory Visit: Payer: Self-pay

## 2019-10-10 ENCOUNTER — Ambulatory Visit (HOSPITAL_COMMUNITY): Payer: Medicare HMO | Attending: Cardiovascular Disease

## 2019-10-10 ENCOUNTER — Ambulatory Visit (HOSPITAL_BASED_OUTPATIENT_CLINIC_OR_DEPARTMENT_OTHER): Payer: Medicare HMO

## 2019-10-10 VITALS — Ht 66.0 in | Wt 187.0 lb

## 2019-10-10 DIAGNOSIS — I208 Other forms of angina pectoris: Secondary | ICD-10-CM | POA: Diagnosis not present

## 2019-10-10 DIAGNOSIS — R110 Nausea: Secondary | ICD-10-CM | POA: Diagnosis not present

## 2019-10-10 LAB — ECHOCARDIOGRAM COMPLETE
Height: 66 in
Weight: 2992 oz

## 2019-10-10 LAB — MYOCARDIAL PERFUSION IMAGING
LV dias vol: 195 mL (ref 62–150)
LV sys vol: 122 mL
Peak HR: 65 {beats}/min
Rest HR: 48 {beats}/min
SDS: 0
SRS: 6
SSS: 6
TID: 0.95

## 2019-10-10 MED ORDER — TECHNETIUM TC 99M TETROFOSMIN IV KIT
9.7000 | PACK | Freq: Once | INTRAVENOUS | Status: AC | PRN
  Administered 2019-10-10: 9.7 via INTRAVENOUS
  Filled 2019-10-10: qty 10

## 2019-10-10 MED ORDER — AMINOPHYLLINE 25 MG/ML IV SOLN
150.0000 mg | Freq: Once | INTRAVENOUS | Status: AC
  Administered 2019-10-10: 150 mg via INTRAVENOUS

## 2019-10-10 MED ORDER — REGADENOSON 0.4 MG/5ML IV SOLN
0.4000 mg | Freq: Once | INTRAVENOUS | Status: AC
  Administered 2019-10-10: 0.4 mg via INTRAVENOUS

## 2019-10-10 MED ORDER — TECHNETIUM TC 99M TETROFOSMIN IV KIT
31.5000 | PACK | Freq: Once | INTRAVENOUS | Status: AC | PRN
  Administered 2019-10-10: 31.5 via INTRAVENOUS
  Filled 2019-10-10: qty 32

## 2019-10-17 ENCOUNTER — Ambulatory Visit: Payer: Medicare HMO | Admitting: Cardiology

## 2019-10-17 ENCOUNTER — Encounter: Payer: Self-pay | Admitting: Cardiology

## 2019-10-17 ENCOUNTER — Other Ambulatory Visit: Payer: Self-pay

## 2019-10-17 VITALS — BP 130/70 | HR 52 | Ht 66.0 in | Wt 187.0 lb

## 2019-10-17 DIAGNOSIS — R001 Bradycardia, unspecified: Secondary | ICD-10-CM | POA: Diagnosis not present

## 2019-10-17 DIAGNOSIS — Z79899 Other long term (current) drug therapy: Secondary | ICD-10-CM | POA: Diagnosis not present

## 2019-10-17 DIAGNOSIS — I440 Atrioventricular block, first degree: Secondary | ICD-10-CM | POA: Diagnosis not present

## 2019-10-17 DIAGNOSIS — I119 Hypertensive heart disease without heart failure: Secondary | ICD-10-CM | POA: Diagnosis not present

## 2019-10-17 LAB — BASIC METABOLIC PANEL
BUN/Creatinine Ratio: 17 (ref 10–24)
BUN: 28 mg/dL — ABNORMAL HIGH (ref 8–27)
CO2: 24 mmol/L (ref 20–29)
Calcium: 10.4 mg/dL — ABNORMAL HIGH (ref 8.6–10.2)
Chloride: 106 mmol/L (ref 96–106)
Creatinine, Ser: 1.62 mg/dL — ABNORMAL HIGH (ref 0.76–1.27)
GFR calc Af Amer: 49 mL/min/{1.73_m2} — ABNORMAL LOW (ref >59)
GFR calc non Af Amer: 42 mL/min/{1.73_m2} — ABNORMAL LOW (ref >59)
Glucose: 167 mg/dL — ABNORMAL HIGH (ref 65–99)
Potassium: 4.7 mmol/L (ref 3.5–5.2)
Sodium: 142 mmol/L (ref 134–144)

## 2019-10-17 NOTE — Patient Instructions (Signed)
Medication Instructions:  The current medical regimen is effective;  continue present plan and medications.  *If you need a refill on your cardiac medications before your next appointment, please call your pharmacy*  Lab Work: Please have blood work today (BMP)  If you have labs (blood work) drawn today and your tests are completely normal, you will receive your results only by: Marland Kitchen MyChart Message (if you have MyChart) OR . A paper copy in the mail If you have any lab test that is abnormal or we need to change your treatment, we will call you to review the results.  Follow-Up: At [facility], you and your health needs are our priority.  As part of our continuing mission to provide you with exceptional heart care, we have created designated Provider Care Teams.  These Care Teams include your primary Cardiologist (physician) and Advanced Practice Providers (APPs -  Physician Assistants and Nurse Practitioners) who all work together to provide you with the care you need, when you need it.  We recommend signing up for the patient portal called "MyChart".  Sign up information is provided on this After Visit Summary.  MyChart is used to connect with patients for Virtual Visits (Telemedicine).  Patients are able to view lab/test results, encounter notes, upcoming appointments, etc.  Non-urgent messages can be sent to your provider as well.   To learn more about what you can do with MyChart, go to [email or web address].    Your next appointment:   4 month(s)  The format for your next appointment:   In Person  Provider:   Kathyrn Drown, NP    Thank you for choosing [facility]!!

## 2019-10-17 NOTE — Progress Notes (Signed)
Cardiology Office Note:    Date:  10/17/2019   ID:  Corey [name], DOB [birth year], MRN [id number]  PCP:  [name], MD  [facility] Cardiologist:  Candee Furbish, MD  [place] Electrophysiologist:  None   Referring MD: [name], MD     History of Present Illness:    Corey [name] is a 74 y.o. male here for the follow-up of stress test.  Recently saw Kathyrn Drown, NP on 09/18/19.  Has a history of severe left ventricular perjury normal EF had a PEA arrest previously.  EF is 60 to 65%.  ECHO 10/10/19 1. Left ventricular ejection fraction, by estimation, is 60 to 65%. The  left ventricle has normal function. The left ventricle has no regional  wall motion abnormalities. There is severe left ventricular hypertrophy.  Left ventricular diastolic parameters  are consistent with Grade II diastolic dysfunction (pseudonormalization).  Elevated left atrial pressure.  2. Right ventricular systolic function is normal. The right ventricular  size is mildly enlarged. There is mildly elevated pulmonary artery  systolic pressure.  3. Left atrial size was severely dilated.  4. Right atrial size was moderately dilated.  5. 1.5 to 2.0cm pericardial effusion. Moderate pericardial effusion. The  pericardial effusion is circumferential. There is no evidence of cardiac  tamponade.  6. The mitral valve is normal in structure. Trivial mitral valve  regurgitation. No evidence of mitral stenosis.  7. The aortic valve is normal in structure. Aortic valve regurgitation is  not visualized. No aortic stenosis is present.  8. There is mild dilatation of the ascending aorta measuring 40 mm.  9. The inferior vena cava is dilated in size with >50% respiratory  variability, suggesting right atrial pressure of 8 mmHg.   Comparison(s): When compared to 2019, pericardial effusion is slightly  worse (but was present 2 years ago and appears chronic).   Nuclear stress test on 10/10/19  NUC:  Nuclear stress EF: 37%. The left ventricular ejection fraction is moderately decreased (30-44%). The LV is severely dilated.  There was no ST segment deviation noted during stress. This is an intermediate risk study. There is no evidence of ischemia or previous infarction  First off, nuclear stress test ejection fraction does not appear to be reliable in his setting.  His echocardiogram is reassuring with normal EF.  He is a little bit better with the Lasix.  Still has lower extremity edema.  Still with some shortness of breath with activity.  Could be post Covid.  Past Medical History:  Diagnosis Date  . Cataract   . Colon polyp 2003   Dr [name]; F/U was to be 2008( not completed)  . CVA (cerebral infarction) 2014  . Diabetes mellitus   . Diverticulosis 2003  . Dyspnea    with exertion  . Gout   . Hypertension   . Myocardial infarction ([place]) 2014  . OSA (obstructive sleep apnea) 03/27/2018  . Pneumonia 09/29/11   Avelox X 10 days as OP  . Prostate cancer ([place])   . Renal insufficiency   . Seizures ([place])    not treated for seizure disorder; had a seizure after stroke 2014; no seizure since then    Past Surgical History:  Procedure Laterality Date  . COLONOSCOPY W/ POLYPECTOMY  2003   no F/U ([place] discussed 12/03/12)  . INSERTION OF MESH N/A 07/16/2017   Procedure: INSERTION OF MESH;  Surgeon: [name], MD;  Location: [place] [facility];  Service: General;  Laterality:  N/A;  . PEG PLACEMENT  2014  . PEG TUBE REMOVAL  2014  . PROSTATE BIOPSY    . TRACHEOSTOMY  2014   was closed after hospital stay  . VENTRAL HERNIA REPAIR N/A 07/16/2017   Procedure: LAPAROSCOPIC VENTRAL HERNIA REPAIR WITH MESH;  Surgeon: [name], MD;  Location: [place];  Service: General;  Laterality: N/A;  . wrist aspiration  02/16/12    monosodium urate crystals; Dr Caralyn Guile    Current Medications: Current Meds  Medication Sig  . Accu-Chek FastClix Lancets MISC Use Accu Chek fastclix lancets to check  blood sugar twice daily. DX:E11.65  . albuterol (VENTOLIN HFA) 108 (90 Base) MCG/ACT inhaler Inhale 2 puffs into the lungs every 4 (four) hours as needed for wheezing or shortness of breath.  Marland Kitchen amLODipine (NORVASC) 10 MG tablet TAKE 1 TABLET EVERY DAY  . aspirin EC 81 MG tablet Take 1 tablet (81 mg total) by mouth daily.  . Blood Glucose Monitoring Suppl (ACCU-CHEK GUIDE ME) w/Device KIT 1 each by Does not apply route 2 (two) times daily. Use Accu Chek Guide Me device to check blood sugar twice daily.  . cloNIDine (CATAPRES) 0.3 MG tablet TAKE 1 TABLET TWICE DAILY  . colchicine (COLCRYS) 0.6 MG tablet Take 2 tabs once and then one tab one hour later as needed for gout  . furosemide (LASIX) 20 MG tablet Take 1 pill daily in the morning  . glimepiride (AMARYL) 1 MG tablet Take 1/2 tab at bedtime  . glucose blood (ACCU-CHEK GUIDE) test strip Use Accu Chek guide test strips as instructed to check blood sugar twice daily. DX:E11.65  . hydrALAZINE (APRESOLINE) 50 MG tablet Take 1.5 tablets (75 mg total) by mouth 3 (three) times daily.  . isosorbide mononitrate (IMDUR) 60 MG 24 hr tablet TAKE 1 TABLET TWICE DAILY  . losartan (COZAAR) 100 MG tablet TAKE 1 TABLET EVERY DAY  . metFORMIN (GLUCOPHAGE-XR) 750 MG 24 hr tablet TAKE 1 TABLET TWICE DAILY  . metoprolol succinate (TOPROL-XL) 100 MG 24 hr tablet Take 1 tablet (100 mg total) by mouth daily. Take with or immediately following a meal.  . pioglitazone (ACTOS) 15 MG tablet Take 1 tablet (15 mg total) by mouth daily.     Allergies:   Hydrochlorothiazide and Allopurinol   Social History   Socioeconomic History  . Marital status: Married    Spouse name: [name]  . Number of children: 2  . Years of education: 6  . Highest education level: 12th grade  Occupational History  . Not on file  Tobacco Use  . Smoking status: Former Smoker    Packs/day: 0.25    Years: 1.00    Pack years: 0.25    Types: Cigarettes    Quit date: 05/15/1968    Years  since quitting: 51.4  . Smokeless tobacco: Never Used  . Tobacco comment: smoked [place], up to 1-2 cigarettes/ day  Substance and Sexual Activity  . Alcohol use: No    Alcohol/week: 0.0 standard drinks  . Drug use: No  . Sexual activity: Yes  Other Topics Concern  . Not on file  Social History Narrative   Walks three times a week, 2-3 miles, occasional walks on treadmill       Patient is right-handed. He lives with his wife in a one level home. He drinks 1 diet Mt. Dew a day and rarely has coffee. He walks 3 x a week for exercise.   Social Determinants of Health  Financial Resource Strain:   . Difficulty of Paying Living Expenses:   Food Insecurity: No Food Insecurity  . Worried About Charity fundraiser in the Last Year: Never true  . Ran Out of Food in the Last Year: Never true  Transportation Needs: No Transportation Needs  . Lack of Transportation (Medical): No  . Lack of Transportation (Non-Medical): No  Physical Activity:   . Days of Exercise per Week:   . Minutes of Exercise per Session:   Stress:   . Feeling of Stress :   Social Connections:   . Frequency of Communication with Friends and Family:   . Frequency of Social Gatherings with Friends and Family:   . Attends Religious Services:   . Active Member of Clubs or Organizations:   . Attends Archivist Meetings:   Marland Kitchen Marital Status:      Family History: The patient's family history includes Cancer in his father; Diabetes in his father and mother; Hypertension in his father and mother; Prostate cancer in his father. There is no history of Stroke, Heart disease, or Colon cancer.  ROS:   Please see the history of present illness.     All other systems reviewed and are negative.  EKGs/Labs/Other Studies Reviewed:    The following studies were reviewed today: As above Recent Labs: 03/05/2019: Pro B Natriuretic peptide (BNP) 1,543.0 06/06/2019: TSH 0.243 06/09/2019: Hemoglobin 13.6; Magnesium 2.1;  Platelets 233 08/01/2019: ALT 34; BUN 25; Creatinine, Ser 1.43; Potassium 4.2; Sodium 141  Recent Lipid Panel    Component Value Date/Time   CHOL 125 04/30/2019 1144   TRIG 128 06/05/2019 0628   HDL 46.50 04/30/2019 1144   CHOLHDL 3 04/30/2019 1144   VLDL 12.0 04/30/2019 1144   LDLCALC 67 04/30/2019 1144    Physical Exam:    VS:  BP 130/70   Pulse (!) 52   Ht 5' 6"  (1.676 m)   Wt 187 lb (84.8 kg)   SpO2 98%   BMI 30.18 kg/m     Wt Readings from Last 3 Encounters:  10/17/19 187 lb (84.8 kg)  10/10/19 187 lb (84.8 kg)  09/18/19 187 lb (84.8 kg)     GEN:  Well nourished, well developed in no acute distress HEENT: Normal NECK: No JVD; No carotid bruits LYMPHATICS: No lymphadenopathy CARDIAC: RRR, no murmurs, rubs, gallops RESPIRATORY:  Clear to auscultation without rales, wheezing or rhonchi  ABDOMEN: Soft, non-tender, non-distended MUSCULOSKELETAL:  2+ LE edema; No deformity  SKIN: Warm and dry NEUROLOGIC:  Alert and oriented x 3 PSYCHIATRIC:  Normal affect   ASSESSMENT:    1. Sinus bradycardia   2. First degree AV block   3. Benign hypertensive heart disease without heart failure   4. Medication management    PLAN:    In order of problems listed above:  Pericardial effusion -Seems chronic without any evidence of tamponade.  No further therapies at this time.  Continue with low-dose Lasix.  No indication for pericardiocentesis.  Shortness of breath -Echo with normal EF.  Nuclear stress test shows no signs of infarction or ischemia.  Overall reassuring. -I would continue with Lasix 20 mg a day.  We will check a basic metabolic profile.  Lower extremity edema -Continue with Lasix 20 mg.  Essential hypertension -Multidrug regimen reviewed as above.  Sinus bradycardia -Improved after decreasing Toprol from 200 down to 100.  Currently 52.  Willing to tolerate.    Medication Adjustments/Labs and Tests Ordered: Current medicines are reviewed  at length with  the patient today.  Concerns regarding medicines are outlined above.  Orders Placed This Encounter  Procedures  . Basic metabolic panel   No orders of the defined types were placed in this encounter.   Patient Instructions  Medication Instructions:  The current medical regimen is effective;  continue present plan and medications.  *If you need a refill on your cardiac medications before your next appointment, please call your pharmacy*  Lab Work: Please have blood work today (BMP)  If you have labs (blood work) drawn today and your tests are completely normal, you will receive your results only by: Marland Kitchen MyChart Message (if you have MyChart) OR . A paper copy in the mail If you have any lab test that is abnormal or we need to change your treatment, we will call you to review the results.  Follow-Up: At [facility], you and your health needs are our priority.  As part of our continuing mission to provide you with exceptional heart care, we have created designated Provider Care Teams.  These Care Teams include your primary Cardiologist (physician) and Advanced Practice Providers (APPs -  Physician Assistants and Nurse Practitioners) who all work together to provide you with the care you need, when you need it.  We recommend signing up for the patient portal called "MyChart".  Sign up information is provided on this After Visit Summary.  MyChart is used to connect with patients for Virtual Visits (Telemedicine).  Patients are able to view lab/test results, encounter notes, upcoming appointments, etc.  Non-urgent messages can be sent to your provider as well.   To learn more about what you can do with MyChart, go to [email or web address].    Your next appointment:   4 month(s)  The format for your next appointment:   In Person  Provider:   Kathyrn Drown, NP    Thank you for choosing [facility]!!        Signed, Candee Furbish, MD  10/17/2019 11:20 AM    [place]

## 2019-10-27 DIAGNOSIS — Z961 Presence of intraocular lens: Secondary | ICD-10-CM | POA: Diagnosis not present

## 2019-10-27 DIAGNOSIS — H2511 Age-related nuclear cataract, right eye: Secondary | ICD-10-CM | POA: Diagnosis not present

## 2019-10-27 DIAGNOSIS — H4720 Unspecified optic atrophy: Secondary | ICD-10-CM | POA: Diagnosis not present

## 2019-10-27 DIAGNOSIS — E119 Type 2 diabetes mellitus without complications: Secondary | ICD-10-CM | POA: Diagnosis not present

## 2019-10-29 ENCOUNTER — Other Ambulatory Visit: Payer: Medicare HMO

## 2019-11-03 ENCOUNTER — Telehealth: Payer: Self-pay | Admitting: Internal Medicine

## 2019-11-03 ENCOUNTER — Other Ambulatory Visit: Payer: Self-pay

## 2019-11-03 NOTE — Patient Outreach (Signed)
[facility]) Care Management  11/03/2019  Corey [name] 04-Apr-1946 [id number]   Telephone Assessment    Outreach attempt to patient. No answer at present. RN CM left HIPAA compliant voicemail message along with contact info.     Plan: RN CM will make outreach attempt to patient within the month of July if no return call from patient.    Enzo Montgomery, RN,BSN,CCM [place] Management Telephonic Care Management Coordinator Direct Phone: [phone] Toll Free: [phone] Fax: [phone]

## 2019-11-04 ENCOUNTER — Other Ambulatory Visit: Payer: Self-pay

## 2019-11-04 DIAGNOSIS — E1165 Type 2 diabetes mellitus with hyperglycemia: Secondary | ICD-10-CM

## 2019-11-04 MED ORDER — BD SWAB SINGLE USE REGULAR PADS: 1.0000 | MEDICATED_PAD | Freq: Four times a day (QID) | 0 refills | Status: DC

## 2019-11-04 MED ORDER — TRUE METRIX BLOOD GLUCOSE TEST VI STRP: 1.0000 | ORAL_STRIP | Freq: Four times a day (QID) | 0 refills | Status: DC

## 2019-11-04 MED ORDER — TRUE METRIX LEVEL 1 LOW VI SOLN: 1.0000 | 0 refills | Status: DC | PRN

## 2019-11-04 MED ORDER — TRUEPLUS LANCETS 33G MISC: 1.0000 | Freq: Four times a day (QID) | 0 refills | Status: DC

## 2019-11-04 MED ORDER — TRUE METRIX METER W/DEVICE KIT: 1.0000 | PACK | Freq: Four times a day (QID) | 0 refills | Status: DC

## 2019-11-04 MED ORDER — GLIMEPIRIDE 1 MG PO TABS: 0.5000 mg | ORAL_TABLET | Freq: Every day | ORAL | 0 refills | Status: DC

## 2019-11-05 ENCOUNTER — Ambulatory Visit: Payer: Medicare HMO | Admitting: Endocrinology

## 2019-11-05 ENCOUNTER — Ambulatory Visit: Payer: Self-pay

## 2019-11-05 ENCOUNTER — Other Ambulatory Visit: Payer: Self-pay

## 2019-11-05 NOTE — Patient Outreach (Signed)
[facility]) Care Management  11/05/2019  Corey [name] December 20, 1945 [id number]   Telephone Assessment    Voicemail message received from patient. Return call placed to patient. Spoke with patient who reports he is doing well. He is pleased to report that he took recommendations/instructions from RN CM previous call with his and made some changes. He voices that he removed salt shaker from dining table and has been adding less salt to meals. RN CM praised and encouraged patient to continue with healthy practices. He voices that he doe not recall receiving educational info mailed to him but wife may have misplace it and he will check on it. He voices that he has not yet checked BP this morning but BP yesterday was 135/80. Blood sugar this morning was 130. Patient states that he has had no further SOB episodes while walking since he cut back some on the distance. He voices that he is doing about 1-1.1miles at a time instead of the 3 miles. He also called and scheduled PCP appt and states he has an appt next week. He denies any RN CM needs or concerns at this time.       Plan: RN CM discussed with patient next outreach within the month of  August. Patient gave verbal consent and in agreement with RN CM follow up and timeframe. Patient aware that they may contact RN CM sooner for any issues or concerns.  Enzo Montgomery, RN,BSN,CCM [place] Management Telephonic Care Management Coordinator Direct Phone: [phone] Toll Free: [phone] Fax: [phone]

## 2019-11-07 ENCOUNTER — Other Ambulatory Visit: Payer: Self-pay

## 2019-11-07 MED ORDER — FUROSEMIDE 20 MG PO TABS: ORAL_TABLET | ORAL | 0 refills | Status: DC

## 2019-11-07 NOTE — Telephone Encounter (Signed)
Sent in today for patient. 

## 2019-11-07 NOTE — Telephone Encounter (Signed)
    [employer] requesting refill be sent to them for furosemide (LASIX) 20 MG tablet

## 2019-11-10 ENCOUNTER — Other Ambulatory Visit: Payer: Self-pay

## 2019-11-10 DIAGNOSIS — H40013 Open angle with borderline findings, low risk, bilateral: Secondary | ICD-10-CM | POA: Diagnosis not present

## 2019-11-10 DIAGNOSIS — Z961 Presence of intraocular lens: Secondary | ICD-10-CM | POA: Diagnosis not present

## 2019-11-10 DIAGNOSIS — H2511 Age-related nuclear cataract, right eye: Secondary | ICD-10-CM | POA: Diagnosis not present

## 2019-11-10 DIAGNOSIS — H47213 Primary optic atrophy, bilateral: Secondary | ICD-10-CM | POA: Diagnosis not present

## 2019-11-10 DIAGNOSIS — E119 Type 2 diabetes mellitus without complications: Secondary | ICD-10-CM | POA: Diagnosis not present

## 2019-11-10 DIAGNOSIS — H4720 Unspecified optic atrophy: Secondary | ICD-10-CM | POA: Diagnosis not present

## 2019-11-17 ENCOUNTER — Other Ambulatory Visit: Payer: Self-pay | Admitting: Cardiology

## 2019-11-26 ENCOUNTER — Other Ambulatory Visit (INDEPENDENT_AMBULATORY_CARE_PROVIDER_SITE_OTHER): Payer: Medicare HMO

## 2019-11-26 ENCOUNTER — Other Ambulatory Visit: Payer: Self-pay

## 2019-11-26 ENCOUNTER — Telehealth: Payer: Self-pay | Admitting: Endocrinology

## 2019-11-26 DIAGNOSIS — E1165 Type 2 diabetes mellitus with hyperglycemia: Secondary | ICD-10-CM | POA: Diagnosis not present

## 2019-11-26 LAB — HEMOGLOBIN A1C: Hgb A1c MFr Bld: 7.9 % — ABNORMAL HIGH (ref 4.6–6.5)

## 2019-11-26 LAB — COMPREHENSIVE METABOLIC PANEL
ALT: 15 U/L (ref 0–53)
AST: 25 U/L (ref 0–37)
Albumin: 4 g/dL (ref 3.5–5.2)
Alkaline Phosphatase: 64 U/L (ref 39–117)
BUN: 35 mg/dL — ABNORMAL HIGH (ref 6–23)
CO2: 26 mEq/L (ref 19–32)
Calcium: 10.1 mg/dL (ref 8.4–10.5)
Chloride: 108 mEq/L (ref 96–112)
Creatinine, Ser: 1.69 mg/dL — ABNORMAL HIGH (ref 0.40–1.50)
GFR: 48.56 mL/min — ABNORMAL LOW (ref >60.00)
Glucose, Bld: 93 mg/dL (ref 70–99)
Potassium: 4.1 mEq/L (ref 3.5–5.1)
Sodium: 140 mEq/L (ref 135–145)
Total Bilirubin: 0.7 mg/dL (ref 0.2–1.2)
Total Protein: 6.6 g/dL (ref 6.0–8.3)

## 2019-11-26 MED ORDER — PIOGLITAZONE HCL 15 MG PO TABS: 15.0000 mg | ORAL_TABLET | Freq: Every day | ORAL | 0 refills | Status: DC

## 2019-11-26 NOTE — Telephone Encounter (Signed)
Medication Refill Request  Did you call your pharmacy and request this refill first? Yes  . If patient has not contacted pharmacy first, instruct them to do so for future refills.  . Remind them that contacting the pharmacy for their refill is the quickest method to get the refill.  . Refill policy also stated that it will take anywhere between 24-72 hours to receive the refill.    Name of medication? pioglitazone   Is this a 90 day supply? yes  Name and location of pharmacy?   [place] ([address]), [place] - [address] Phone:  [phone]  Fax:  [phone]

## 2019-11-26 NOTE — Telephone Encounter (Signed)
Rx sent 

## 2019-11-27 LAB — FRUCTOSAMINE: Fructosamine: 320 umol/L — ABNORMAL HIGH (ref 0–285)

## 2019-12-01 ENCOUNTER — Other Ambulatory Visit: Payer: Self-pay

## 2019-12-01 ENCOUNTER — Encounter: Payer: Self-pay | Admitting: Endocrinology

## 2019-12-01 ENCOUNTER — Ambulatory Visit (INDEPENDENT_AMBULATORY_CARE_PROVIDER_SITE_OTHER): Payer: Medicare HMO | Admitting: Endocrinology

## 2019-12-01 ENCOUNTER — Other Ambulatory Visit: Payer: Self-pay | Admitting: Internal Medicine

## 2019-12-01 VITALS — BP 128/72 | HR 97 | Ht 66.0 in | Wt 181.2 lb

## 2019-12-01 DIAGNOSIS — E119 Type 2 diabetes mellitus without complications: Secondary | ICD-10-CM | POA: Diagnosis not present

## 2019-12-01 DIAGNOSIS — N189 Chronic kidney disease, unspecified: Secondary | ICD-10-CM | POA: Diagnosis not present

## 2019-12-01 DIAGNOSIS — I10 Essential (primary) hypertension: Secondary | ICD-10-CM

## 2019-12-01 NOTE — Patient Instructions (Addendum)
Do not use True Metrix meter  Walk in ams  Check blood sugars on waking up 2-3  days a week  Also check blood sugars about 2 hours after meals and do this after different meals by rotation  Recommended blood sugar levels on waking up are 90-130 and about 2 hours after meal is 130-160  Please bring your blood sugar monitor to each visit, thank you

## 2019-12-01 NOTE — Progress Notes (Signed)
Patient ID: Corey [name], male   DOB: February 15, 1946, 74 y.o.   MRN: [id number]   Reason for Appointment : Followup of Type 2 Diabetes   History of Present Illness          Diagnosis: Type 2 diabetes mellitus, date of diagnosis: 1997      Past history: The patient was initially treated with various oral hypoglycemic drugs and details are not available. About 2009 he was started on insulin and has either taken premixed insulin or Lantus once a day He had difficulty affording his Lantus insulin and had preferred to take premixed insulin Subsequently insulin was tapered off  Recent history:    Oral hypoglycemic drugs: Metformin ER 750 mg twice daily, Amaryl 0.5 mg at bedtime  Current blood sugar patterns and problems:  His A1c is still relatively high at 7.9, previously higher 8.1 Fructosamine is still high at 320 although in the past has been 394   He is using the Accu-Chek [employer] meter which appears to be accurate compared to the lab glucose  His insurance company likely has sent a generic monitor but has not used it  His blood sugars overall are very well controlled and lower than expected from his A1c; only has 1 unusually high reading of 192 in the morning  No hypoglycemia now  He is not very active because of fatigue and is walking only about once a week  His weight is about 6 pounds lower  His wife says that he is skipping lunch sometimes and blood sugar was 75 last evening before a late dinner   Glucose monitoring:  done less than 1 time a day         Glucometer:  Accu-Chek      Blood Glucose readings from download:   PRE-MEAL Fasting Lunch Dinner Bedtime Overall  Glucose range:  91-192  102-136  75, 83   75-192  Mean/median:  111    110   POST-MEAL PC Breakfast PC Lunch PC Dinner  Glucose range:    85-151  Mean/median:    104   Previous data:  PRE-MEAL Fasting Lunch Dinner Bedtime Overall  Glucose range:  102-170    113-165   Mean/median:  135    139   138    Self-care:       Meals: 2-3 meals per day. Egg in am at 9-10 am or grits, rarely sandwich at lunch. His largest meal is at suppertime at about 8 pm.  Avoiding regular soft drinks  Physical activity: exercise: Walking 30 minutes; 3/7  days        Dietician visit: Most recent: 02/2014               Compliance with the medical regimen:  Fair.    Wt Readings from Last 3 Encounters:  12/01/19 181 lb 3.2 oz (82.2 kg)  10/17/19 187 lb (84.8 kg)  10/10/19 187 lb (84.8 kg)    Lab Results  Component Value Date   HGBA1C 7.9 (H) 11/26/2019   HGBA1C 8.1 (H) 08/01/2019   HGBA1C 7.5 (H) 04/30/2019    Lab Results  Component Value Date   MICROALBUR 2.5 (H) 12/20/2018   LDLCALC 67 04/30/2019   CREATININE 1.69 (H) 11/26/2019   Lab on 11/26/2019  Component Date Value Ref Range Status  . Sodium 11/26/2019 140  135 - 145 mEq/L Final  . Potassium 11/26/2019 4.1  3.5 - 5.1 mEq/L Final  . Chloride 11/26/2019 108  96 -  112 mEq/L Final  . CO2 11/26/2019 26  19 - 32 mEq/L Final  . Glucose, Bld 11/26/2019 93  70 - 99 mg/dL Final  . BUN 11/26/2019 35* 6 - 23 mg/dL Final  . Creatinine, Ser 11/26/2019 1.69* 0.40 - 1.50 mg/dL Final  . Total Bilirubin 11/26/2019 0.7  0.2 - 1.2 mg/dL Final  . Alkaline Phosphatase 11/26/2019 64  39 - 117 U/L Final  . AST 11/26/2019 25  0 - 37 U/L Final  . ALT 11/26/2019 15  0 - 53 U/L Final  . Total Protein 11/26/2019 6.6  6.0 - 8.3 g/dL Final  . Albumin 11/26/2019 4.0  3.5 - 5.2 g/dL Final  . GFR 11/26/2019 48.56* >60.00 mL/min Final  . Calcium 11/26/2019 10.1  8.4 - 10.5 mg/dL Final  . Hgb A1c MFr Bld 11/26/2019 7.9* 4.6 - 6.5 % Final   Glycemic Control Guidelines for People with Diabetes:Non Diabetic:  <6%Goal of Therapy: <7%Additional Action Suggested:  >8%   . Fructosamine 11/26/2019 320* 0 - 285 umol/L Final   Comment: Published reference interval for apparently healthy subjects between age 54 and 53 is 45 - 285 umol/L and in a poorly controlled  diabetic population is 228 - 563 umol/L with a mean of 396 umol/L.      Allergies as of 12/01/2019      Reactions   Hydrochlorothiazide Other (See Comments)   Gout , uncontrolled diabetes and renal insufficiency   Allopurinol Other (See Comments)   Renal insufficiency      Medication List       Accurate as of December 01, 2019 10:44 AM. If you have any questions, ask your nurse or doctor.        albuterol 108 (90 Base) MCG/ACT inhaler Commonly known as: VENTOLIN HFA Inhale 2 puffs into the lungs every 4 (four) hours as needed for wheezing or shortness of breath.   amLODipine 10 MG tablet Commonly known as: NORVASC TAKE 1 TABLET EVERY DAY   aspirin EC 81 MG tablet Take 1 tablet (81 mg total) by mouth daily.   B-D SINGLE USE SWABS REGULAR Pads 1 each by Does not apply route 4 (four) times daily. E11.9   cloNIDine 0.3 MG tablet Commonly known as: CATAPRES TAKE 1 TABLET TWICE DAILY   colchicine 0.6 MG tablet Commonly known as: Colcrys Take 2 tabs once and then one tab one hour later as needed for gout   furosemide 20 MG tablet Commonly known as: LASIX TAKE 1 TABLET BY MOUTH ONCE DAILY IN THE MORNING   glimepiride 1 MG tablet Commonly known as: AMARYL Take 0.5 tablets (0.5 mg total) by mouth at bedtime.   hydrALAZINE 50 MG tablet Commonly known as: APRESOLINE TAKE 1 AND 1/2 TABLETS THREE TIMES DAILY   isosorbide mononitrate 60 MG 24 hr tablet Commonly known as: IMDUR TAKE 1 TABLET TWICE DAILY   losartan 100 MG tablet Commonly known as: COZAAR TAKE 1 TABLET EVERY DAY   metFORMIN 750 MG 24 hr tablet Commonly known as: GLUCOPHAGE-XR TAKE 1 TABLET TWICE DAILY   metoprolol succinate 100 MG 24 hr tablet Commonly known as: TOPROL-XL Take 1 tablet (100 mg total) by mouth daily. Take with or immediately following a meal.   pioglitazone 15 MG tablet Commonly known as: Actos Take 1 tablet (15 mg total) by mouth daily.   True Metrix Blood Glucose Test test  strip Generic drug: glucose blood 1 each by Other route 4 (four) times daily. E11.9   True Metrix Level  1 Low Soln 1 each by Other route as needed (Use to calibrate glucometer as needed). E11.9   True Metrix Meter w/Device Kit 1 each by Does not apply route 4 (four) times daily. E11.9   TRUEplus Lancets 33G Misc 1 each by Does not apply route 4 (four) times daily. E11.9       Allergies:  Allergies  Allergen Reactions  . Hydrochlorothiazide Other (See Comments)    Gout , uncontrolled diabetes and renal insufficiency  . Allopurinol Other (See Comments)    Renal insufficiency    Past Medical History:  Diagnosis Date  . Cataract   . Colon polyp 2003   Dr [name]; F/U was to be 2008( not completed)  . CVA (cerebral infarction) 2014  . Diabetes mellitus   . Diverticulosis 2003  . Dyspnea    with exertion  . Gout   . Hypertension   . Myocardial infarction ([name]) 2014  . OSA (obstructive sleep apnea) 03/27/2018  . Pneumonia 09/29/11   Avelox X 10 days as OP  . Prostate cancer ([place])   . Renal insufficiency   . Seizures ([place])    not treated for seizure disorder; had a seizure after stroke 2014; no seizure since then    Past Surgical History:  Procedure Laterality Date  . COLONOSCOPY W/ POLYPECTOMY  2003   no F/U ([place] discussed 12/03/12)  . INSERTION OF MESH N/A 07/16/2017   Procedure: INSERTION OF MESH;  Surgeon: [name], MD;  Location: [place];  Service: General;  Laterality: N/A;  . PEG PLACEMENT  2014  . PEG TUBE REMOVAL  2014  . PROSTATE BIOPSY    . TRACHEOSTOMY  2014   was closed after hospital stay  . VENTRAL HERNIA REPAIR N/A 07/16/2017   Procedure: LAPAROSCOPIC VENTRAL HERNIA REPAIR WITH MESH;  Surgeon: [name], MD;  Location: [facility];  Service: General;  Laterality: N/A;  . wrist aspiration  02/16/12    monosodium urate crystals; Dr Caralyn Guile    Family History  Problem Relation Age of Onset  . Hypertension Father   . Prostate cancer Father    . Diabetes Father   . Cancer Father   . Hypertension Mother   . Diabetes Mother   . Stroke Neg Hx   . Heart disease Neg Hx   . Colon cancer Neg Hx     Social History:  reports that he quit smoking about 51 years ago. His smoking use included cigarettes. He has a 0.25 pack-year smoking history. He has never used smokeless tobacco. He reports that he does not drink alcohol and does not use drugs.    Review of Systems   Lipids: Has normal levels and  not on a statin drug  Lab Results  Component Value Date   CHOL 125 04/30/2019   HDL 46.50 04/30/2019   LDLCALC 67 04/30/2019   TRIG 128 06/05/2019   CHOLHDL 3 04/30/2019    Eye exams: Last done in 1/20 without retinopathy     He has mild renal insufficiency of unclear etiology with variable creatinine levels as follows:  Lab Results  Component Value Date   CREATININE 1.69 (H) 11/26/2019   CREATININE 1.62 (H) 10/17/2019   CREATININE 1.43 08/01/2019     HYPERTENSION: Currently on losartan, amlodipine 5, clonidine 0.3 mg and hydralazine as well as metoprolol  He has been followed by PCP for this  BP Readings from Last 3 Encounters:  12/01/19 128/72  10/17/19 130/70  09/18/19 (!) 150/100  Foot exam done in 3/20 with PCP  His wife is asking about his lack of energy and does not do much during the day Has not discussed with PCP, does not complain of somnolence  Physical Examination:  BP 128/72 (BP Location: Left Arm, Patient Position: Sitting, Cuff Size: Normal)   Pulse 97   Ht 5' 6"  (1.676 m)   Wt 181 lb 3.2 oz (82.2 kg)   SpO2 98%   BMI 29.25 kg/m     ASSESSMENT/PLAN:  Diabetes type 2, uncontrolled   See history of present illness for discussion of current diabetes management, blood sugar patterns and problems identified  His A1c is still relatively high at 7.9  He is on metformin 1500 mg daily and 0.5 mg Amaryl at bedtime His blood sugars are looking fairly good at home with only rare high blood  sugars Fasting blood sugars are generally improved with the only half milligram Amaryl at bedtime  His A1c may be falsely high  Has lost some weight and not clear if this is from change in appetite For now we will continue the same regimen  Fatigue: He will discuss this with his PCP, not clear if he may do better with reducing high dose clonidine that he is taking  HYPERTENSION: Well controlled  Renal dysfunction: Creatinine is still relatively high and would defer any management to PCP  There are no Patient Instructions on file for this visit.   Elayne Snare 12/01/2019, 10:44 AM

## 2019-12-09 DIAGNOSIS — G4733 Obstructive sleep apnea (adult) (pediatric): Secondary | ICD-10-CM | POA: Diagnosis not present

## 2019-12-10 DIAGNOSIS — H2511 Age-related nuclear cataract, right eye: Secondary | ICD-10-CM | POA: Diagnosis not present

## 2019-12-10 DIAGNOSIS — E119 Type 2 diabetes mellitus without complications: Secondary | ICD-10-CM | POA: Diagnosis not present

## 2019-12-10 DIAGNOSIS — H47213 Primary optic atrophy, bilateral: Secondary | ICD-10-CM | POA: Diagnosis not present

## 2019-12-10 DIAGNOSIS — Z961 Presence of intraocular lens: Secondary | ICD-10-CM | POA: Diagnosis not present

## 2019-12-11 ENCOUNTER — Other Ambulatory Visit: Payer: Self-pay | Admitting: Internal Medicine

## 2019-12-22 ENCOUNTER — Telehealth: Payer: Self-pay | Admitting: Internal Medicine

## 2019-12-22 DIAGNOSIS — E1122 Type 2 diabetes mellitus with diabetic chronic kidney disease: Secondary | ICD-10-CM

## 2019-12-22 NOTE — Telephone Encounter (Signed)
Patient would like referral to [facility] Reason: Toes clipped

## 2019-12-24 ENCOUNTER — Other Ambulatory Visit: Payer: Self-pay

## 2019-12-24 NOTE — Patient Outreach (Signed)
[facility]) Care Management  12/24/2019  [place] [birth year] [id number]   Telephone Assessment    Outreach attempt # 1 to patient. Spoke with patient who reports he is doing well. He denies any acute issues or concerns at present. He goes to see PCP next week. He saw endocrinologist a few weeks ago. A1C level was 7.9. Patient reports MD did not make any new changes and advised him to continue with current regimen. Patient reports appetite WNL for him. He usually only eats one "big meal" at dinnertime and has snacks during the day. RN CM discussed and reviewed importance of eating healthy food choices along with small,freq meals and patient verbalized understanding. He states that he has gotten better at limiting his salt intake and BP has improved. He has not checked it yet for today. He continues to try to go walking about 3x/wk for 30 mins to an hour with intermittently pauses. He denies any RN CM needs or concerns at this time.    Medications Reviewed Today    Reviewed by Hayden Pedro, RN (Registered Nurse) on 12/24/19 at [place] List Status: <None>  Medication Order Taking? Sig Documenting Provider Last Dose Status Informant  albuterol (VENTOLIN HFA) 108 (90 Base) MCG/ACT inhaler [id number] No Inhale 2 puffs into the lungs every 4 (four) hours as needed for wheezing or shortness of breath. Tower, Wynelle Fanny, MD Taking Active Self  Alcohol Swabs (B-D SINGLE USE SWABS REGULAR) PADS [id number] No 1 each by Does not apply route 4 (four) times daily. E11.9 Elayne Snare, MD Taking Active   amLODipine (NORVASC) 10 MG tablet [id number] No TAKE 1 TABLET EVERY DAY Burns, [name], MD Taking Active   aspirin EC 81 MG tablet [id number] No Take 1 tablet (81 mg total) by mouth daily. [name], MD Taking Active Self  Blood Glucose Calibration (TRUE METRIX LEVEL 1) Low SOLN [id number] No 1 each by Other route as needed (Use to calibrate glucometer as needed). E11.9 Elayne Snare, MD Taking Active   Blood Glucose Monitoring Suppl (TRUE METRIX METER) w/Device KIT [id number] No 1 each by Does not apply route 4 (four) times daily. E11.9 Elayne Snare, MD Taking Active   cloNIDine (CATAPRES) 0.3 MG tablet [id number] No TAKE 1 TABLET TWICE DAILY Burns, [name], MD Taking Active   colchicine (COLCRYS) 0.6 MG tablet [id number] No Take 2 tabs once and then one tab one hour later as needed for gout [name], MD Taking Active Self  furosemide (LASIX) 20 MG tablet [id number] No TAKE 1 TABLET BY MOUTH ONCE DAILY IN THE MORNING Burns, [name], MD Taking Active   glimepiride (AMARYL) 1 MG tablet [id number] No Take 0.5 tablets (0.5 mg total) by mouth at bedtime. Elayne Snare, MD Taking Active   glucose blood (TRUE METRIX BLOOD GLUCOSE TEST) test strip [id number] No 1 each by Other route 4 (four) times daily. E11.9 Elayne Snare, MD Taking Active   hydrALAZINE (APRESOLINE) 50 MG tablet [id number] No TAKE 1 AND 1/2 TABLETS THREE TIMES DAILY Jerline Pain, MD Taking Active   isosorbide mononitrate (IMDUR) 60 MG 24 hr tablet [id number]  Take 1 tablet (60 mg total) by mouth 2 (two) times daily. Follow =up appt is due in must see provider for future refills [name], MD  Active   losartan (COZAAR) 100 MG tablet [id number] No TAKE 1 TABLET EVERY DAY Burns, [name], MD Taking Active   metFORMIN (GLUCOPHAGE-XR) 750 MG 24  hr tablet [id number] No TAKE 1 TABLET TWICE DAILY Elayne Snare, MD Taking Active   metoprolol succinate (TOPROL-XL) 100 MG 24 hr tablet [id number] No Take 1 tablet (100 mg total) by mouth daily. Take with or immediately following a meal. Jerline Pain, MD Taking Expired 10/17/19 2359 Self           Med Note Lanna Poche R   Thu Sep 18, 2019 11:03 AM)    pioglitazone (ACTOS) 15 MG tablet [id number] No Take 1 tablet (15 mg total) by mouth daily. Elayne Snare, MD Taking Active   TRUEplus Lancets 33G Krebs [id number] No 1 each by Does not apply route 4 (four) times daily. E11.9 Elayne Snare, MD Taking Active   Med List Note Skeet Simmer [facility] 01/12/13 7121): a          [facility] CM Care Plan Problem One     Most Recent Value  Care Plan Problem One Knowledge deficit related to disease process and mgmt of chronic conditions  Role Documenting the Problem One Care Management Telephonic Coordinator  Care Plan for Problem One Active  [facility] Long Term Goal  Patient will report a lowering of A1C level from 7.9 over the next 90 days.  [facility] Long Term Goal Start Date 12/24/19  [facility] Long Term Goal Met Date 12/24/19  Interventions for Problem One Long Term Goal RNCM reviewed and discussed current A1C level and recnt cbgs values. RNCM reinforced diabetic diet and education.     [facility] CM Care Plan Problem Two     Most Recent Value  [facility] CM Short Term Goal #1  Patient will complete cardiac workup and MD appt over the next 30 days.  [facility] CM Short Term Goal #1 Start Date 09/30/19  [facility] CM Short Term Goal #1 Met Date  12/24/19    [facility] CM Care Plan Problem Three     Most Recent Value  Care Plan Problem Three Decreased appetite and loss of taste.  Role Documenting the Problem Three Care Management Telephonic Coordinator  Care Plan for Problem Three Not Active       Plan: RN CM discussed with patient next outreach within the month of November . Patient gave verbal consent and in agreement with RN CM follow up and timeframe. Patient aware that they may contact RN CM sooner for any issues or concerns. RN CM will send quarterly update to PCP.   Enzo Montgomery, RN,BSN,CCM [place] Management Telephonic Care Management Coordinator Direct Phone: [phone] Toll Free: [phone] Fax: [phone]

## 2019-12-29 ENCOUNTER — Other Ambulatory Visit: Payer: Self-pay | Admitting: Endocrinology

## 2019-12-29 ENCOUNTER — Other Ambulatory Visit: Payer: Self-pay | Admitting: Internal Medicine

## 2019-12-29 DIAGNOSIS — E1165 Type 2 diabetes mellitus with hyperglycemia: Secondary | ICD-10-CM

## 2019-12-31 ENCOUNTER — Ambulatory Visit: Payer: Self-pay

## 2020-01-05 ENCOUNTER — Other Ambulatory Visit: Payer: Self-pay | Admitting: Internal Medicine

## 2020-01-26 ENCOUNTER — Telehealth: Payer: Self-pay | Admitting: Internal Medicine

## 2020-01-26 NOTE — Progress Notes (Signed)
  Chronic Care Management   Note  01/26/2020 Name: Corey [name] MRN: [id number] DOB: 10/01/1945  [name] is a 74 y.o. year old male who is a primary care patient of Burns, [name], MD. I reached out to [name] by phone today in response to a referral sent by [name] PCP, [name], MD.   [name] was given information about Chronic Care Management services today including:  1. CCM service includes personalized support from designated clinical staff supervised by his physician, including individualized plan of care and coordination with other care providers 2. 24/7 contact phone numbers for assistance for urgent and routine care needs. 3. Service will only be billed when office clinical staff spend 20 minutes or more in a month to coordinate care. 4. Only one practitioner may furnish and bill the service in a calendar month. 5. The patient may stop CCM services at any time (effective at the end of the month) by phone call to the office staff.   Patient agreed to services and verbal consent obtained.   Follow up plan:   Carley Perdue [employer] Scheduler

## 2020-01-27 NOTE — Patient Instructions (Addendum)
  Blood work was ordered.   ° ° °Flu immunization administered today.   ° ° °Medications reviewed and updated.  Changes include :   none ° °Your prescription(s) have been submitted to your pharmacy. Please take as directed and contact our office if you believe you are having problem(s) with the medication(s). ° ° °Please followup in 6 months ° ° °

## 2020-01-27 NOTE — Progress Notes (Signed)
Subjective:    Patient ID: Corey [name], male    DOB: 07-20-1945, 74 y.o.   MRN: [id number]  HPI The patient is here for follow up of their chronic medical problems, including htn, DM, CKD,    He sees Dr Dwyane Dee for his DM.    BP at home  - 120/80, 140-150/?  -- usually 130--140  He is not compliant with a low-sodium diet.  He does not elevate his legs.  He does have some swelling in his legs.  He does walk, but not regularly.    Medications and allergies reviewed with patient and updated if appropriate.  Patient Active Problem List   Diagnosis Date Noted  . Acute respiratory disease due to COVID-19 virus 06/05/2019  . DOE (dyspnea on exertion) 03/01/2019  . Mild cognitive impairment 12/05/2018  . Slurred speech 07/23/2018  . Muscle twitching 07/23/2018  . OSA (obstructive sleep apnea) 03/27/2018  . Snores 01/21/2018  . Poor balance 01/23/2017  . Ankle swelling, left 09/06/2016  . Bradycardia 01/28/2016  . Weak urinary stream 12/31/2015  . Stage 3b chronic kidney disease 09/15/2015  . Ventral hernia without obstruction or gangrene 09/15/2015  . Prostate cancer ([place]) 07/25/2015  . Erectile dysfunction 05/04/2015  . Urinary frequency 05/04/2015  . 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 Abnormal finding on MRI of brain 02/14/2015  . PEA (Pulseless electrical activity) ([place]) 12/03/2012  . Diabetes ([place]) 06/30/2011  . Gout 03/05/2007  . Essential hypertension 03/05/2007    Current Outpatient Medications on File Prior to Visit  Medication Sig Dispense Refill  . albuterol (VENTOLIN HFA) 108 (90 Base) MCG/ACT inhaler Inhale 2 puffs into the lungs every 4 (four) hours as needed for wheezing or shortness of breath. 18 g 1  . Alcohol Swabs (B-D SINGLE USE SWABS REGULAR) PADS 1 each by Does not apply route 4 (four) times daily. E11.9 400 each 0  . amLODipine (NORVASC) 10 MG tablet TAKE 1 TABLET EVERY DAY 90 tablet 1  . aspirin EC 81 MG tablet Take 1  tablet (81 mg total) by mouth daily.    . Blood Glucose Calibration (TRUE METRIX LEVEL 1) Low SOLN 1 each by Other route as needed (Use to calibrate glucometer as needed). E11.9 1 each 0  . Blood Glucose Monitoring Suppl (TRUE METRIX METER) w/Device KIT 1 each by Does not apply route 4 (four) times daily. E11.9 1 kit 0  . cloNIDine (CATAPRES) 0.3 MG tablet TAKE 1 TABLET TWICE DAILY 180 tablet 0  . colchicine (COLCRYS) 0.6 MG tablet Take 2 tabs once and then one tab one hour later as needed for gout 60 tablet 1  . furosemide (LASIX) 20 MG tablet TAKE 1 TABLET BY MOUTH ONCE DAILY IN THE MORNING 30 tablet 0  . glimepiride (AMARYL) 1 MG tablet Take 0.5 tablets (0.5 mg total) by mouth at bedtime. 45 tablet 0  . hydrALAZINE (APRESOLINE) 50 MG tablet TAKE 1 AND 1/2 TABLETS THREE TIMES DAILY 405 tablet 3  . isosorbide mononitrate (IMDUR) 60 MG 24 hr tablet Take 1 tablet (60 mg total) by mouth 2 (two) times daily. Follow =up appt is due in must see provider for future refills 180 tablet 0  . losartan (COZAAR) 100 MG tablet TAKE 1 TABLET EVERY DAY 90 tablet 1  . metFORMIN (GLUCOPHAGE-XR) 750 MG 24 hr tablet TAKE 1 TABLET TWICE DAILY 180 tablet 1  . pioglitazone (ACTOS) 15 MG tablet Take 1 tablet (15 mg total) by mouth  daily. 90 tablet 0  . TRUE METRIX BLOOD GLUCOSE TEST test strip TEST BLOOD SUGAR FOUR TIMES DAILY 360 strip 0  . TRUEplus Lancets 33G MISC 1 each by Does not apply route 4 (four) times daily. E11.9 360 each 0  . metoprolol succinate (TOPROL-XL) 100 MG 24 hr tablet Take 1 tablet (100 mg total) by mouth daily. Take with or immediately following a meal. 90 tablet 3   No current facility-administered medications on file prior to visit.    Past Medical History:  Diagnosis Date  . Cataract   . Colon polyp 2003   Dr [name]; F/U was to be 2008( not completed)  . CVA (cerebral infarction) 2014  . Diabetes mellitus   . Diverticulosis 2003  . Dyspnea    with exertion  . Gout   .  Hypertension   . Myocardial infarction ([place]) 2014  . OSA (obstructive sleep apnea) 03/27/2018  . Pneumonia 09/29/11   Avelox X 10 days as OP  . Prostate cancer ([place])   . Renal insufficiency   . Seizures ([place])    not treated for seizure disorder; had a seizure after stroke 2014; no seizure since then    Past Surgical History:  Procedure Laterality Date  . COLONOSCOPY W/ POLYPECTOMY  2003   no F/U ([place] discussed 12/03/12)  . INSERTION OF MESH N/A 07/16/2017   Procedure: INSERTION OF MESH;  Surgeon: [name], MD;  Location: [facility];  Service: General;  Laterality: N/A;  . PEG PLACEMENT  2014  . PEG TUBE REMOVAL  2014  . PROSTATE BIOPSY    . TRACHEOSTOMY  2014   was closed after hospital stay  . VENTRAL HERNIA REPAIR N/A 07/16/2017   Procedure: LAPAROSCOPIC VENTRAL HERNIA REPAIR WITH MESH;  Surgeon: [name], MD;  Location: [facility];  Service: General;  Laterality: N/A;  . wrist aspiration  02/16/12    monosodium urate crystals; Dr Caralyn Guile    Social History   Socioeconomic History  . Marital status: Married    Spouse name: [name]  . Number of children: 2  . Years of education: 36  . Highest education level: 12th grade  Occupational History  . Not on file  Tobacco Use  . Smoking status: Former Smoker    Packs/day: 0.25    Years: 1.00    Pack years: 0.25    Types: Cigarettes    Quit date: 05/15/1968    Years since quitting: 51.7  . Smokeless tobacco: Never Used  . Tobacco comment: smoked [place], up to 1-2 cigarettes/ day  Vaping Use  . Vaping Use: Never used  Substance and Sexual Activity  . Alcohol use: No    Alcohol/week: 0.0 standard drinks  . Drug use: No  . Sexual activity: Yes  Other Topics Concern  . Not on file  Social History Narrative   Walks three times a week, 2-3 miles, occasional walks on treadmill       Patient is right-handed. He lives with his wife in a one level home. He drinks 1 diet Mt. Dew a day and rarely has coffee. He walks 3 x  a week for exercise.   Social Determinants of Health   Financial Resource Strain:   . Difficulty of Paying Living Expenses: Not on file  Food Insecurity: No Food Insecurity  . Worried About Charity fundraiser in the Last Year: Never true  . Ran Out of Food in the Last Year: Never true  Transportation Needs:  No Transportation Needs  . Lack of Transportation (Medical): No  . Lack of Transportation (Non-Medical): No  Physical Activity:   . Days of Exercise per Week: Not on file  . Minutes of Exercise per Session: Not on file  Stress:   . Feeling of Stress : Not on file  Social Connections:   . Frequency of Communication with Friends and Family: Not on file  . Frequency of Social Gatherings with Friends and Family: Not on file  . Attends Religious Services: Not on file  . Active Member of Clubs or Organizations: Not on file  . Attends Club or Organization Meetings: Not on file  . Marital Status: Not on file    Family History  Problem Relation Age of Onset  . Hypertension Father   . Prostate cancer Father   . Diabetes Father   . Cancer Father   . Hypertension Mother   . Diabetes Mother   . Stroke Neg Hx   . Heart disease Neg Hx   . Colon cancer Neg Hx     Review of Systems  Constitutional: Negative for diaphoresis and fever.  Respiratory: Positive for shortness of breath (occ). Negative for cough and wheezing.   Cardiovascular: Positive for leg swelling. Negative for chest pain and palpitations.  Neurological: Negative for light-headedness and headaches.       Objective:   Vitals:   01/28/20 0928  BP: (!) 142/68  Pulse: (!) 49  Temp: 98.3 F (36.8 C)  SpO2: 97%   BP Readings from Last 3 Encounters:  01/28/20 (!) 142/68  12/01/19 128/72  10/17/19 130/70   Wt Readings from Last 3 Encounters:  01/28/20 188 lb 9.6 oz (85.5 kg)  12/01/19 181 lb 3.2 oz (82.2 kg)  10/17/19 187 lb (84.8 kg)   Body mass index is 30.44 kg/m.   Physical Exam    Constitutional:  Appears well-developed and well-nourished. No distress.  HENT:  Head: Normocephalic and atraumatic.  Neck: Neck supple. No tracheal deviation present. No thyromegaly present.  No cervical lymphadenopathy Cardiovascular: Normal rate, regular rhythm and normal heart sounds.   No murmur heard. No carotid bruit .  Mild 1+ bilateral lower extremity edema Pulmonary/Chest: Effort normal and breath sounds normal. No respiratory distress. No has no wheezes. No rales.  Skin: Skin is warm and dry. Not diaphoretic.  Psychiatric: Normal mood and affect. Behavior is normal.      Assessment & Plan:    See Problem List for Assessment and Plan of chronic medical problems.    This visit occurred during the SARS-CoV-2 public health emergency.  Safety protocols were in place, including screening questions prior to the visit, additional usage of staff PPE, and extensive cleaning of exam room while observing appropriate contact time as indicated for disinfecting solutions.    

## 2020-01-28 ENCOUNTER — Ambulatory Visit (INDEPENDENT_AMBULATORY_CARE_PROVIDER_SITE_OTHER): Payer: Medicare HMO | Admitting: Internal Medicine

## 2020-01-28 ENCOUNTER — Other Ambulatory Visit: Payer: Self-pay | Admitting: Endocrinology

## 2020-01-28 ENCOUNTER — Encounter: Payer: Self-pay | Admitting: Internal Medicine

## 2020-01-28 ENCOUNTER — Other Ambulatory Visit: Payer: Self-pay | Admitting: Internal Medicine

## 2020-01-28 ENCOUNTER — Other Ambulatory Visit: Payer: Self-pay

## 2020-01-28 VITALS — BP 142/68 | HR 49 | Temp 98.3°F | Wt 188.6 lb

## 2020-01-28 DIAGNOSIS — M10072 Idiopathic gout, left ankle and foot: Secondary | ICD-10-CM

## 2020-01-28 DIAGNOSIS — E1165 Type 2 diabetes mellitus with hyperglycemia: Secondary | ICD-10-CM | POA: Diagnosis not present

## 2020-01-28 DIAGNOSIS — Z23 Encounter for immunization: Secondary | ICD-10-CM | POA: Diagnosis not present

## 2020-01-28 DIAGNOSIS — E1122 Type 2 diabetes mellitus with diabetic chronic kidney disease: Secondary | ICD-10-CM | POA: Diagnosis not present

## 2020-01-28 DIAGNOSIS — I10 Essential (primary) hypertension: Secondary | ICD-10-CM | POA: Diagnosis not present

## 2020-01-28 DIAGNOSIS — Z794 Long term (current) use of insulin: Secondary | ICD-10-CM

## 2020-01-28 MED ORDER — AMLODIPINE BESYLATE 10 MG PO TABS: 10.0000 mg | ORAL_TABLET | Freq: Every day | ORAL | 1 refills | Status: DC

## 2020-01-28 MED ORDER — ISOSORBIDE MONONITRATE ER 60 MG PO TB24
60.0000 mg | ORAL_TABLET | Freq: Two times a day (BID) | ORAL | 1 refills | Status: DC
Start: 2020-01-28 — End: 2020-03-25

## 2020-01-28 MED ORDER — LOSARTAN POTASSIUM 100 MG PO TABS
100.0000 mg | ORAL_TABLET | Freq: Every day | ORAL | 1 refills | Status: DC
Start: 2020-01-28 — End: 2020-01-28

## 2020-01-28 MED ORDER — GLIMEPIRIDE 1 MG PO TABS: 0.5000 mg | ORAL_TABLET | Freq: Every day | ORAL | 0 refills | Status: DC

## 2020-01-28 MED ORDER — COLCHICINE 0.6 MG PO TABS: ORAL_TABLET | ORAL | 1 refills | Status: DC

## 2020-01-28 MED ORDER — CLONIDINE HCL 0.3 MG PO TABS
0.3000 mg | ORAL_TABLET | Freq: Two times a day (BID) | ORAL | 1 refills | Status: DC
Start: 2020-01-28 — End: 2020-04-15

## 2020-01-28 NOTE — Assessment & Plan Note (Signed)
-  Neck, intermittent Has not had any gout symptoms in a while Likes to keep the colchicine on hand just in case he gets 1 Will renew prescription today use as needed and follow prescription instructions

## 2020-01-28 NOTE — Assessment & Plan Note (Signed)
Chronic  Management per Dr Kumar 

## 2020-01-28 NOTE — Assessment & Plan Note (Addendum)
Chronic Blood pressure borderline high here today Blood pressure?  Ideally controlled Already on 7 medications at this point so I will not make any changes today Stressed low-sodium diet, regular exercise CMP, CBC

## 2020-01-29 LAB — CBC WITH DIFFERENTIAL/PLATELET
Absolute Monocytes: 389 cells/uL (ref 200–950)
Basophils Absolute: 20 cells/uL (ref 0–200)
Basophils Relative: 0.7 %
Eosinophils Absolute: 148 cells/uL (ref 15–500)
Eosinophils Relative: 5.1 %
HCT: 32.4 % — ABNORMAL LOW (ref 38.5–50.0)
Hemoglobin: 10.7 g/dL — ABNORMAL LOW (ref 13.2–17.1)
Lymphs Abs: 655 cells/uL — ABNORMAL LOW (ref 850–3900)
MCH: 27.6 pg (ref 27.0–33.0)
MCHC: 33 g/dL (ref 32.0–36.0)
MCV: 83.7 fL (ref 80.0–100.0)
MPV: 10 fL (ref 7.5–12.5)
Monocytes Relative: 13.4 %
Neutro Abs: 1688 cells/uL (ref 1500–7800)
Neutrophils Relative %: 58.2 %
Platelets: 169 10*3/uL (ref 140–400)
RBC: 3.87 10*6/uL — ABNORMAL LOW (ref 4.20–5.80)
RDW: 13.9 % (ref 11.0–15.0)
Total Lymphocyte: 22.6 %
WBC: 2.9 10*3/uL — ABNORMAL LOW (ref 3.8–10.8)

## 2020-01-29 LAB — COMPLETE METABOLIC PANEL WITH GFR
AG Ratio: 2 (calc) (ref 1.0–2.5)
ALT: 23 U/L (ref 9–46)
AST: 31 U/L (ref 10–35)
Albumin: 3.8 g/dL (ref 3.6–5.1)
Alkaline phosphatase (APISO): 48 U/L (ref 35–144)
BUN/Creatinine Ratio: 17 (calc) (ref 6–22)
BUN: 29 mg/dL — ABNORMAL HIGH (ref 7–25)
CO2: 26 mmol/L (ref 20–32)
Calcium: 9.7 mg/dL (ref 8.6–10.3)
Chloride: 113 mmol/L — ABNORMAL HIGH (ref 98–110)
Creat: 1.69 mg/dL — ABNORMAL HIGH (ref 0.70–1.18)
GFR, Est African American: 46 mL/min/{1.73_m2} — ABNORMAL LOW (ref >=60)
GFR, Est Non African American: 40 mL/min/{1.73_m2} — ABNORMAL LOW (ref >=60)
Globulin: 1.9 g/dL (calc) (ref 1.9–3.7)
Glucose, Bld: 143 mg/dL — ABNORMAL HIGH (ref 65–99)
Potassium: 4.3 mmol/L (ref 3.5–5.3)
Sodium: 143 mmol/L (ref 135–146)
Total Bilirubin: 0.7 mg/dL (ref 0.2–1.2)
Total Protein: 5.7 g/dL — ABNORMAL LOW (ref 6.1–8.1)

## 2020-01-29 LAB — LIPID PANEL
Cholesterol: 125 mg/dL (ref <200)
HDL: 50 mg/dL (ref >=40)
LDL Cholesterol (Calc): 61 mg/dL (calc)
Non-HDL Cholesterol (Calc): 75 mg/dL (calc) (ref <130)
Total CHOL/HDL Ratio: 2.5 (calc) (ref <5.0)
Triglycerides: 55 mg/dL (ref <150)

## 2020-02-05 ENCOUNTER — Telehealth: Payer: Self-pay | Admitting: *Deleted

## 2020-02-05 NOTE — Telephone Encounter (Signed)
Rec'd PA for pt Colchicine 0.6 mg. Completed PA on cover-my-meds. Rec'd msg stating " PA Case: [id number], Status: Approved, Coverage Starts on: 05/16/2019 12:00:00 AM, Coverage Ends on: 05/14/2020 12:00:00 AM. Questions? Contact [phone].". ..Corey [name]

## 2020-02-13 NOTE — Progress Notes (Deleted)
Cardiology Office Note   Date:  02/13/2020   ID:  Corey [name], DOB [birth year], MRN [id number]  PCP:  [name], MD  Cardiologist: Dr. Marlou Porch, MD  No chief complaint on file.     History of Present Illness: Corey [name] is a 74 y.o. male who presents for follow-up, seen for Dr. Marlou Porch.  Mr. Teschner has a history of severe left ventricular hypertrophy with normal LVEF and prolonged hospital stay in the setting of PEA arrest with infection and hypertensive urgency.   He was seen by Dr. Marlou Porch 04/15/2019 at which time he continued to have chronic shortness of breath with activity which have been unchanged for quite some time. Inhalers not to help symptoms. Continued to walk three days/week without significant issues. Heart rate at last OV was in the upper 40s therefore his Toprol was decreased.  Bradycardia felt to be contributing to his shortness of breath.  Last echocardiogram from 05/30/2017 with normal LVEF at 60 to 65% with severe concentric hypertrophy with no regional wall motion abnormalities. Did have Doppler parameters consistent with restrictive left ventricular relaxation with G3 DD, severely dilated LA and small to moderate circumferential pericardial effusion with no evidence of tamponade.  In follow-up with myself he had recently been diagnosed with Covid and continued to have dyspnea therefore an echocardiogram and Lexiscan stress test were performed.  Echo showed a stable LVEF with LVH and G2 DD, moderate pericardial effusion measuring 1.5 to 2.0 cm with no evidence of tamponade, and no valvular disease.  Stress test showed an underestimated LV function at 30 to 44% which did not coincide with echocardiogram as above.  Echocardiogram is reassuring.  He reported improved dyspnea with Lasix however still had lower extremity edema.  Lasix 20 mg p.o. daily was continued.   1.  Pericardial effusion: -Noted on echocardiogram with moderate sizing at 1.5-2.0 cm  2.   Shortness of breath: -Echocardiogram performed secondary to dyspnea with normal LV function and grade 2 DD.  Nuclear stress test with no signs of infarct or ischemia -Continue p.o. Lasix 20 mg daily  3.  LE edema: -Improved after the addition of Lasix 20 mg p.o. daily  4.  Essential hypertension: -Stable, -Continue current regimen  5.  Sinus bradycardia: -Improved after decreasing Toprol dosing from 200 mg to 100 mg daily. -Asymptomatic   1.  Shortness of breath: -Felt to be chronic and previously related to bradycardia however worrisome given more recent change. Likely secondary to COVID infection 07/2019.  -Prior echo from 2019 with normal LVEF at 60 to 65% with severe concentric hypertrophy with no regional wall motion abnormalities. Did have Doppler parameters consistent with restrictive left ventricular relaxation with G3 DD, severely dilated LA and small to moderate circumferential pericardial effusion with no evidence of tamponade. -Recheck echocardiogram -Appears euvolemic on exam today  2. Chest pain with dizziness: -Reports exertional SOB with associated chest pain and mild dizziness>>>worrisome given his prior hx of severe concentric hypertrophy. Reports symptoms occur with walking and will decrease with rest. Could also be related to recent COVID infection for which he was hospitalized for one week. Has not followed with PCP for this>>recommended  -EKG with no acute changes  -No prior coronary evaluation -No bruit on exam however may need carotid dopplers if normal echo and stress testing   -Will obtain Lexiscan stress test with close follow up  2.  Sinus bradycardia with first-degree AV block: -Heart rate at last OV in the upper  40s therefore metoprolol decreased from 200 mg to 100 mg daily with plans for close follow-up however it does not appear that this occurred -HR improved on EKG today at 52bpm -Low suspicion that dizziness is related to bradycardia   3.   Hypertensive heart disease: -Elevated, 150/100 -On many antihypertensives -Will increase hydralazine to 32m TID and follow after testing described above     Past Medical History:  Diagnosis Date  . Cataract   . Colon polyp 2003   Dr SLyla Son F/U was to be 2008( not completed)  . CVA (cerebral infarction) 2014  . Diabetes mellitus   . Diverticulosis 2003  . Dyspnea    with exertion  . Gout   . Hypertension   . [place]  . OSA (obstructive sleep apnea) 03/27/2018  . Pneumonia 09/29/11   Avelox X 10 days as OP  . [place]state cancer ([place]   . Renal insufficiency   . [place]    not treated for seizure disorder; had a seizure after stroke 2014; no seizure since then    Past Surgical History:  Procedure Laterality Date  . COLONOSCOPY W/ POLYPECTOMY  2003   no F/U ([place] 12/03[place]12)  . INSERTION OF MESH N/A 07/16/2017   Procedure: INSERTION OF MESH;  Surgeon: [name] MD;  [facility]  Service: General;  Laterality: N/A;  . PEG PLACEMENT  2014  . PEG TUBE REMOVAL  2014  . PROSTATE BIOPSY    . TRACHEOSTOMY  2014   was closed after hospital stay  . VENTRAL HERNIA REPAIR N/A 07/16/2017   Procedure: LAPAROSCOPIC VENTRAL HERNIA REPAIR WITH MESH;  Surgeon: [name] MD;  [facility]  Service: General;  Laterality: N/A;  . wrist aspiration  02/16/12    monosodium urate crystals; Dr [name]    Current Outpatient Medications  Medication Sig Dispense Refill  . albuterol (VENTOLIN HFA) 108 (90 Base) MCG/ACT inhaler Inhale 2 puffs into the lungs every 4 (four) hours as needed for wheezing or shortness of breath. 18 g 1  . Alcohol Swabs (B-D SINGLE USE SWABS REGULAR) PADS 1 each by Does not apply route 4 (four) times daily. E11.9 400 each 0  . amLODipine (NORVASC) 10 MG tablet Take 1 tablet (10 mg total) by mouth daily. 90 tablet 1  . aspirin EC 81 MG tablet Take 1 tablet (81 mg total) by mouth daily.    . Blood Glucose  Calibration (TRUE METRIX LEVEL 1) Low SOLN 1 each by Other route as needed (Use to calibrate glucometer as needed). E11.9 1 each 0  . Blood Glucose Monitoring Suppl (TRUE METRIX METER) w/Device KIT 1 each by Does not apply route 4 (four) times daily. E11.9 1 kit 0  . cloNIDine (CATAPRES) 0.3 MG tablet Take 1 tablet (0.3 mg total) by mouth 2 (two) times daily. 180 tablet 1  . colchicine (COLCRYS) 0.6 MG tablet Take 2 tabs once and then one tab one hour later as needed for gout 60 tablet 1  . furosemide (LASIX) 20 MG tablet TAKE 1 TABLET BY MOUTH ONCE DAILY IN THE MORNING 30 tablet 0  . glimepiride (AMARYL) 1 MG tablet Take 0.5 tablets (0.5 mg total) by mouth at bedtime. 45 tablet 0  . hydrALAZINE (APRESOLINE) 50 MG tablet TAKE 1 AND 1/2 TABLETS THREE TIMES DAILY 405 tablet 3  . isosorbide mononitrate (IMDUR) 60 MG 24 hr tablet Take 1 tablet (60 mg total) by mouth 2 (two) times  daily. 180 tablet 1  . losartan (COZAAR) 100 MG tablet TAKE 1 TABLET EVERY DAY 90 tablet 1  . metFORMIN (GLUCOPHAGE-XR) 750 MG 24 hr tablet TAKE 1 TABLET TWICE DAILY 180 tablet 0  . metoprolol succinate (TOPROL-XL) 100 MG 24 hr tablet Take 1 tablet (100 mg total) by mouth daily. Take with or immediately following a meal. 90 tablet 3  . pioglitazone (ACTOS) 15 MG tablet Take 1 tablet (15 mg total) by mouth daily. 90 tablet 0  . TRUE METRIX BLOOD GLUCOSE TEST test strip TEST BLOOD SUGAR FOUR TIMES DAILY 360 strip 0  . TRUEplus Lancets 33G MISC 1 each by Does not apply route 4 (four) times daily. E11.9 360 each 0   No current facility-administered medications for this visit.    Allergies:   Hydrochlorothiazide and Allopurinol    Social History:  The patient  reports that he quit smoking about 51 years ago. His smoking use included cigarettes. He has a 0.25 pack-year smoking history. He has never used smokeless tobacco. He reports that he does not drink alcohol and does not use drugs.   Family History:  The patient's  ***family history includes Cancer in his father; Diabetes in his father and mother; Hypertension in his father and mother; Prostate cancer in his father.    ROS:  Please see the history of present illness.   Otherwise, review of systems are positive for {NONE DEFAULTED:18576::"none"}.   All other systems are reviewed and negative.    PHYSICAL EXAM: VS:  There were no vitals taken for this visit. , BMI There is no height or weight on file to calculate BMI. GEN: Well nourished, well developed, in no acute distress HEENT: normal Neck: no JVD, carotid bruits, or masses Cardiac: ***RRR; no murmurs, rubs, or gallops,no edema  Respiratory:  clear to auscultation bilaterally, normal work of breathing GI: soft, nontender, nondistended, + BS MS: no deformity or atrophy Skin: warm and dry, no rash Neuro:  Strength and sensation are intact Psych: euthymic mood, full affect   EKG:  EKG {ACTION; IS/IS [id number]:[id number]} ordered today. The ekg ordered today demonstrates ***   Recent Labs: 03/05/2019: Pro B Natriuretic peptide (BNP) 1,543.0 06/06/2019: TSH 0.243 06/09/2019: Magnesium 2.1 01/28/2020: ALT 23; BUN 29; Creat 1.69; Hemoglobin 10.7; Platelets 169; Potassium 4.3; Sodium 143    Lipid Panel    Component Value Date/Time   CHOL 125 01/28/2020 1000   TRIG 55 01/28/2020 1000   HDL 50 01/28/2020 1000   CHOLHDL 2.5 01/28/2020 1000   VLDL 12.0 04/30/2019 1144   LDLCALC 61 01/28/2020 1000      Wt Readings from Last 3 Encounters:  01/28/20 188 lb 9.6 oz (85.5 kg)  12/01/19 181 lb 3.2 oz (82.2 kg)  10/17/19 187 lb (84.8 kg)      Other studies Reviewed: Additional studies/ records that were reviewed today include: ***. Review of the above records demonstrates: ***  ECHO 10/10/19 1. Left ventricular ejection fraction, by estimation, is 60 to 65%. The  left ventricle has normal function. The left ventricle has no regional  wall motion abnormalities. There is severe left ventricular  hypertrophy.  Left ventricular diastolic parameters  are consistent with Grade II diastolic dysfunction (pseudonormalization).  Elevated left atrial pressure.  2. Right ventricular systolic function is normal. The right ventricular  size is mildly enlarged. There is mildly elevated pulmonary artery  systolic pressure.  3. Left atrial size was severely dilated.  4. Right atrial size was moderately dilated.  5.  1.5 to 2.0cm pericardial effusion. Moderate pericardial effusion. The  pericardial effusion is circumferential. There is no evidence of cardiac  tamponade.  6. The mitral valve is normal in structure. Trivial mitral valve  regurgitation. No evidence of mitral stenosis.  7. The aortic valve is normal in structure. Aortic valve regurgitation is  not visualized. No aortic stenosis is present.  8. There is mild dilatation of the ascending aorta measuring 40 mm.  9. The inferior vena cava is dilated in size with >50% respiratory  variability, suggesting right atrial pressure of 8 mmHg.   Comparison(s): When compared to 2019, pericardial effusion is slightly  worse (but was present 2 years ago and appears chronic).   Nuclear stress test on 10/10/19  [facility]: Nuclear stress EF: 37%. The left ventricular ejection fraction is moderately decreased (30-44%). The LV is severely dilated.  There was no ST segment deviation noted during stress.     Echocardiogram 05/30/2017:  Study Conclusions   - Left ventricle: The cavity size was normal. There was severe  concentric hypertrophy - wall thickness of 2.0 cm - consider HCM  or infiltrative cardiomyopathy. Systolic function was normal. The  estimated ejection fraction was in the range of 60% to 65%. Wall  motion was normal; there were no regional wall motion  abnormalities. Doppler parameters are consistent with restrictive  left ventricular relaxation (grade 3 diastolic dysfunction). The  E/A ratio is 1.5. The E/e&'  ratio is >20, suggesting markedly  elevated LV filling pressure.  - Aorta: Aortic root dimension: 39 mm (ED).  - Aortic root: The aortic root is mildly dilated.  - Left atrium: Severely dilated.  - Right ventricle: RVH. Normal size and systolic function.  - Right atrium: Mildly dilated.  - Tricuspid valve: There was trivial regurgitation.  - Pulmonic valve: Mildly thickened leaflets. There was mild  regurgitation.  - Pulmonary arteries: PA peak pressure: 17 mm Hg (S).  - Inferior vena cava: The vessel was dilated. The respirophasic  diameter changes were blunted (< 50%), consistent with elevated  central venous pressure.  - Pericardium, extracardiac: Small to moderate cirumferential  pericardial effusion. Clinical correlation for tamponade  physiology is always recommended.   Impressions:   - Compared to a study in 2015, the LV wall thickening is worse,  measuring 2.0 cm -findings in a patient with renal insufficiency,  may suggest an infiltrative cardiomyopathy such as amyloidosis.  There is restrictive (Grade 3) diastolic dysfunction wtih high LV  filling pressure. A persistent small to moderate-sized  circumferential pericardial effusion is noted - there is RVH and  therefore tamponade physiology cannot be excluded. Consider  cardiac MRI to evalute myocardium further, however, renal  function may not allow contrast.    ASSESSMENT AND PLAN:  1.  ***   Current medicines are reviewed at length with the patient today.  The patient {ACTIONS; HAS/DOES NOT HAVE:19233} concerns regarding medicines.  The following changes have been made:  {PLAN; NO CHANGE:13088:s}  Labs/ tests ordered today include: *** No orders of the defined types were placed in this encounter.    Disposition:   FU with *** in {gen number 8-41:[id number]} {Days to years:10300}  Signed, Kathyrn Drown, NP  02/13/2020 7:24 PM    [facility] [address],  [place], [place]  [address] Phone: [phone]; Fax: [phone]

## 2020-02-17 ENCOUNTER — Ambulatory Visit: Payer: Medicare HMO | Admitting: Cardiology

## 2020-02-20 ENCOUNTER — Telehealth: Payer: Self-pay | Admitting: Internal Medicine

## 2020-02-20 ENCOUNTER — Other Ambulatory Visit: Payer: Self-pay

## 2020-02-20 MED ORDER — FUROSEMIDE 20 MG PO TABS: ORAL_TABLET | ORAL | 0 refills | Status: DC

## 2020-02-20 NOTE — Telephone Encounter (Signed)
Sent in today for patient. 

## 2020-02-20 NOTE — Telephone Encounter (Signed)
    Patient requesting refill for furosemide (LASIX) 20 MG tablet for 90 day supply from [facility]

## 2020-02-23 NOTE — Progress Notes (Deleted)
Telehealth Visit  {Choose 1 Note Type (Telehealth Visit or Telephone Visit):[phone]}   Evaluation Performed:  Follow-up visit   The patient was identified using 2 identifiers.   This visit type was conducted due to national recommendations for restrictions regarding the COVID-19 Pandemic (e.g. social distancing).  This format is felt to be most appropriate for this patient at this time.  All issues noted in this document were discussed and addressed.  No physical exam was performed (except for noted visual exam findings with Video Visits).  Please refer to the patient's chart (MyChart message for video visits and phone note for telephone visits) for the patient's consent to telehealth for [facility].  Date:  02/23/2020   ID:  Corey [name], DOB 10/27/1945, MRN [id number]  Patient Location:  Home  Provider location:   Home  PCP:  [name], MD  Cardiologist:  Servando Snare & Candee Furbish, MD  Electrophysiologist:  None   Chief Complaint:  Follow up   History of Present Illness:    Corey [name] is a 74 y.o. male who presents via audio/video conferencing for a telehealth visit today.  Seen for Dr. Marlou Porch.   He has a history of severe left ventricular hypertrophy with normal LVEF and prolonged hospital stay in the setting of prior PEA arrest with infection and hypertensive urgency. He has had chronic DOE. Other issues include bradycardia - with reduction of beta blocker therapy, chronic pericardial effusion and prior COVID illlness in March 2021.   Last seen by Dr. Marlou Porch in early June - breathing a bit better with Lasix but still with some lower extremity edema and DOE - ?post COVID. Had had echo updated along with Myoview earlier this summer.   The patient {does/does not:200015} have symptoms concerning for COVID-19 infection (fever, chills, cough, or new shortness of breath).   Seen today via ***. *** has consented for this visit.    Past Medical History:  Diagnosis Date   . Cataract   . Colon polyp 2003   Dr [name]; F/U was to be 2008( not completed)  . CVA (cerebral infarction) 2014  . Diabetes mellitus   . Diverticulosis 2003  . Dyspnea    with exertion  . Gout   . Hypertension   . Myocardial infarction ([place]) 2014  . OSA (obstructive sleep apnea) 03/27/2018  . Pneumonia 09/29/11   Avelox X 10 days as OP  . Prostate cancer ([place])   . Renal insufficiency   . Seizures ([place])    not treated for seizure disorder; had a seizure after stroke 2014; no seizure since then   Past Surgical History:  Procedure Laterality Date  . COLONOSCOPY W/ POLYPECTOMY  2003   no F/U ([place] discussed 12/03/12)  . INSERTION OF MESH N/A 07/16/2017   Procedure: INSERTION OF MESH;  Surgeon: [name], MD;  Location: [place];  Service: General;  Laterality: N/A;  . PEG PLACEMENT  2014  . PEG TUBE REMOVAL  2014  . PROSTATE BIOPSY    . TRACHEOSTOMY  2014   was closed after hospital stay  . VENTRAL HERNIA REPAIR N/A 07/16/2017   Procedure: LAPAROSCOPIC VENTRAL HERNIA REPAIR WITH MESH;  Surgeon: [name], MD;  Location: [place];  Service: General;  Laterality: N/A;  . wrist aspiration  02/16/12    monosodium urate crystals; Dr Caralyn Guile     No outpatient medications have been marked as taking for the 03/01/20 encounter (Appointment) with Burtis Junes, NP.  Allergies:   Hydrochlorothiazide and Allopurinol   Social History   Tobacco Use  . Smoking status: Former Smoker    Packs/day: 0.25    Years: 1.00    Pack years: 0.25    Types: Cigarettes    Quit date: 05/15/1968    Years since quitting: 51.8  . Smokeless tobacco: Never Used  . Tobacco comment: smoked [place], up to 1-2 cigarettes/ day  Vaping Use  . Vaping Use: Never used  Substance Use Topics  . Alcohol use: No    Alcohol/week: 0.0 standard drinks  . Drug use: No     Family Hx: The patient's family history includes Cancer in his father; Diabetes in his father and mother; Hypertension in  his father and mother; Prostate cancer in his father. There is no history of Stroke, Heart disease, or Colon cancer.  ROS:   Please see the history of present illness.   All other systems reviewed are negative except for ***.    Objective:    Vital Signs:  There were no vitals taken for this visit.   Wt Readings from Last 3 Encounters:  01/28/20 188 lb 9.6 oz (85.5 kg)  12/01/19 181 lb 3.2 oz (82.2 kg)  10/17/19 187 lb (84.8 kg)    Alert male in no acute distress.   Labs/Other Tests and Data Reviewed:    Lab Results  Component Value Date   WBC 2.9 (L) 01/28/2020   HGB 10.7 (L) 01/28/2020   HCT 32.4 (L) 01/28/2020   PLT 169 01/28/2020   GLUCOSE 143 (H) 01/28/2020   CHOL 125 01/28/2020   TRIG 55 01/28/2020   HDL 50 01/28/2020   LDLCALC 61 01/28/2020   ALT 23 01/28/2020   AST 31 01/28/2020   NA 143 01/28/2020   K 4.3 01/28/2020   CL 113 (H) 01/28/2020   CREATININE 1.69 (H) 01/28/2020   BUN 29 (H) 01/28/2020   CO2 26 01/28/2020   TSH 0.243 (L) 06/06/2019   PSA 4.48 (H) 06/16/2015   INR 1.0 06/05/2019   HGBA1C 7.9 (H) 11/26/2019   MICROALBUR 2.5 (H) 12/20/2018     BNP (last 3 results) No results for input(s): BNP in the last 8760 hours.  ProBNP (last 3 results) Recent Labs    03/05/19 1101  PROBNP 1,543.0*      Prior CV studies:    The following studies were reviewed today:  ECHO 10/10/19 1. Left ventricular ejection fraction, by estimation, is 60 to 65%. The  left ventricle has normal function. The left ventricle has no regional  wall motion abnormalities. There is severe left ventricular hypertrophy.  Left ventricular diastolic parameters  are consistent with Grade II diastolic dysfunction (pseudonormalization).  Elevated left atrial pressure.  2. Right ventricular systolic function is normal. The right ventricular  size is mildly enlarged. There is mildly elevated pulmonary artery  systolic pressure.  3. Left atrial size was severely dilated.   4. Right atrial size was moderately dilated.  5. 1.5 to 2.0cm pericardial effusion. Moderate pericardial effusion. The  pericardial effusion is circumferential. There is no evidence of cardiac  tamponade.  6. The mitral valve is normal in structure. Trivial mitral valve  regurgitation. No evidence of mitral stenosis.  7. The aortic valve is normal in structure. Aortic valve regurgitation is  not visualized. No aortic stenosis is present.  8. There is mild dilatation of the ascending aorta measuring 40 mm.  9. The inferior vena cava is dilated in size with >50% respiratory  variability, suggesting right atrial pressure of 8 mmHg.   Comparison(s): When compared to 2019, pericardial effusion is slightly  worse (but was present 2 years ago and appears chronic).   Nuclear stress test on 10/10/19  NUC: Nuclear stress EF: 37%. The left ventricular ejection fraction is moderately decreased (30-44%). The LV is severely dilated.  There was no ST segment deviation noted during stress. This is an intermediate risk study. There is no evidence of ischemia or previous infarction  Per Dr. Marlou Porch - First off, nuclear stress test ejection fraction does not appear to be reliable in his setting.  His echocardiogram is reassuring with normal EF.     ASSESSMENT & PLAN:    1.  Sinus bradycardia  2. 1st Degree AV block  3. HTN  4. Chronic pericardial effusion -   5. Chronic DOE  6. Severe LVH -   Pericardial effusion -Seems chronic without any evidence of tamponade.  No further therapies at this time.  Continue with low-dose Lasix.  No indication for pericardiocentesis.  Shortness of breath -Echo with normal EF.  Nuclear stress test shows no signs of infarction or ischemia.  Overall reassuring. -I would continue with Lasix 20 mg a day.  We will check a basic metabolic profile.  Lower extremity edema -Continue with Lasix 20 mg.  Essential hypertension -Multidrug regimen  reviewed as above.  Sinus bradycardia -Improved after decreasing Toprol from 200 down to 100.  Currently 52.  Willing to tolerate.  Marland Kitchen COVID-19 Education: The signs and symptoms of COVID-19 were discussed with the patient and how to seek care for testing (follow up with PCP or arrange E-visit).  The importance of social distancing, staying at home, hand hygiene and wearing a mask when out in public were discussed today.  Patient Risk:   After full review of this patient's clinical status, I feel that they are at least moderate risk at this time.  Time:   Today, I have spent *** minutes with the patient with telehealth technology discussing the above issues.     Medication Adjustments/Labs and Tests Ordered: Current medicines are reviewed at length with the patient today.  Concerns regarding medicines are outlined above.   Tests Ordered: No orders of the defined types were placed in this encounter.   Medication Changes: No orders of the defined types were placed in this encounter.   Disposition:  FU with *** in {gen number [id number]} {Days to years:10300}.   Patient is agreeable to this plan and will call if any problems develop in the interim.   Amie Critchley, NP  02/23/2020 7:28 AM    [facility]

## 2020-02-27 NOTE — Progress Notes (Signed)
Telehealth Visit     Virtual Visit via Telephone Note   This visit type was conducted due to national recommendations for restrictions regarding the COVID-19 Pandemic (e.g. social distancing) in an effort to limit this patient's exposure and mitigate transmission in our community.  Due to his co-morbid illnesses, this patient is at least at moderate risk for complications without adequate follow up.  This format is felt to be most appropriate for this patient at this time.  The patient did not have access to video technology/had technical difficulties with video requiring transitioning to audio format only (telephone).  All issues noted in this document were discussed and addressed.  No physical exam could be performed with this format.  Please refer to the patient's chart for his  consent to telehealth for [facility].   Evaluation Performed:  Follow-up visit   The patient was identified using 2 identifiers.   This visit type was conducted due to national recommendations for restrictions regarding the COVID-19 Pandemic (e.g. social distancing).  This format is felt to be most appropriate for this patient at this time.  All issues noted in this document were discussed and addressed.  No physical exam was performed (except for noted visual exam findings with Video Visits).  Please refer to the patient's chart (MyChart message for video visits and phone note for telephone visits) for the patient's consent to telehealth for [facility].  Date:  03/01/2020   ID:  Corey [name], Corey [name] 1945/07/13, MRN [id number]  Patient Location:  Home  Provider location:   [facility]  PCP:  [name], MD  Cardiologist:  Candee Furbish, MD  Electrophysiologist:  None   Chief Complaint:  Follow up  History of Present Illness:    Corey [name] is a 74 y.o. male who presents via audio/video conferencing for a telehealth visit today.  Seen for Dr. Marlou Porch.   He has a history of severe LVH with  normal LVEF with prior prolonged hospital stay in the setting of PEA arrest with infection and hypertensive urgency. He has had chronic DOE. He had COVID 19 in March. He has had reduction in beta blocker due to bradycardia. He has chronic small to moderate pericardial effusion known.   Last seen in June by Dr. Marlou Porch. He had follow up cardiac testing prior to that visit due to issues with ongoing DOE and chest pain. Was felt to be doing a bit better.   The patient does not have symptoms concerning for COVID-19 infection (fever, chills, cough, or new shortness of breath).   Seen today by telephone visit. He has consented for this visit. He feels he is doing well. He notes that he has good days and bad days with his breathing - he might have to stop and rest but he feels pretty good. No chest pain. No real swelling. Overall, he feels he is doing ok from our standpoint. He has some foot issues and is going to [place] later this month. He has no concerns today.   Past Medical History:  Diagnosis Date  . Cataract   . Colon polyp 2003   Dr [name]; F/U was to be 2008( not completed)  . CVA (cerebral infarction) 2014  . Diabetes mellitus   . Diverticulosis 2003  . Dyspnea    with exertion  . Gout   . Hypertension   . Myocardial infarction ([place]) 2014  . OSA (obstructive sleep apnea) 03/27/2018  . Pneumonia 09/29/11   Avelox  X 10 days as OP  . Prostate cancer ([place])   . Renal insufficiency   . Seizures ([place])    not treated for seizure disorder; had a seizure after stroke 2014; no seizure since then   Past Surgical History:  Procedure Laterality Date  . COLONOSCOPY W/ POLYPECTOMY  2003   no F/U ([place] discussed 12/03/12)  . INSERTION OF MESH N/A 07/16/2017   Procedure: INSERTION OF MESH;  Surgeon: [name], MD;  Location: [place];  Service: General;  Laterality: N/A;  . PEG PLACEMENT  2014  . PEG TUBE REMOVAL  2014  . PROSTATE BIOPSY    . TRACHEOSTOMY  2014   was closed after  hospital stay  . VENTRAL HERNIA REPAIR N/A 07/16/2017   Procedure: LAPAROSCOPIC VENTRAL HERNIA REPAIR WITH MESH;  Surgeon: [name], MD;  Location: [place];  Service: General;  Laterality: N/A;  . wrist aspiration  02/16/12    monosodium urate crystals; Dr Caralyn Guile     Current Meds  Medication Sig  . amLODipine (NORVASC) 10 MG tablet Take 1 tablet (10 mg total) by mouth daily.  Marland Kitchen aspirin EC 81 MG tablet Take 1 tablet (81 mg total) by mouth daily.  . cloNIDine (CATAPRES) 0.3 MG tablet Take 1 tablet (0.3 mg total) by mouth 2 (two) times daily.  . colchicine (COLCRYS) 0.6 MG tablet Take 2 tabs once and then one tab one hour later as needed for gout  . furosemide (LASIX) 20 MG tablet Take 1 tablet by mouth once daily in the morning  . glimepiride (AMARYL) 1 MG tablet Take 0.5 tablets (0.5 mg total) by mouth at bedtime.  . hydrALAZINE (APRESOLINE) 50 MG tablet TAKE 1 AND 1/2 TABLETS THREE TIMES DAILY  . isosorbide mononitrate (IMDUR) 60 MG 24 hr tablet Take 1 tablet (60 mg total) by mouth 2 (two) times daily.  Marland Kitchen losartan (COZAAR) 100 MG tablet TAKE 1 TABLET EVERY DAY  . metFORMIN (GLUCOPHAGE-XR) 750 MG 24 hr tablet TAKE 1 TABLET TWICE DAILY  . pioglitazone (ACTOS) 15 MG tablet Take 1 tablet (15 mg total) by mouth daily.  . [DISCONTINUED] albuterol (VENTOLIN HFA) 108 (90 Base) MCG/ACT inhaler Inhale 2 puffs into the lungs every 4 (four) hours as needed for wheezing or shortness of breath.     Allergies:   Hydrochlorothiazide and Allopurinol   Social History   Tobacco Use  . Smoking status: Former Smoker    Packs/day: 0.25    Years: 1.00    Pack years: 0.25    Types: Cigarettes    Quit date: 05/15/1968    Years since quitting: 51.8  . Smokeless tobacco: Never Used  . Tobacco comment: smoked [place], up to 1-2 cigarettes/ day  Vaping Use  . Vaping Use: Never used  Substance Use Topics  . Alcohol use: No    Alcohol/week: 0.0 standard drinks  . Drug use: No     Family Hx: The  patient's family history includes Cancer in his father; Diabetes in his father and mother; Hypertension in his father and mother; Prostate cancer in his father. There is no history of Stroke, Heart disease, or Colon cancer.  ROS:   Please see the history of present illness.   All other systems reviewed are negative.    Objective:    Vital Signs:  BP 140/80   Ht 5\' 6"  (1.676 m)   Wt 188 lb (85.3 kg)   BMI 30.34 kg/m    Wt Readings from Last 3  Encounters:  03/01/20 188 lb (85.3 kg)  01/28/20 188 lb 9.6 oz (85.5 kg)  12/01/19 181 lb 3.2 oz (82.2 kg)    Alert male in no acute distress.   Labs/Other Tests and Data Reviewed:    Lab Results  Component Value Date   WBC 2.9 (L) 01/28/2020   HGB 10.7 (L) 01/28/2020   HCT 32.4 (L) 01/28/2020   PLT 169 01/28/2020   GLUCOSE 143 (H) 01/28/2020   CHOL 125 01/28/2020   TRIG 55 01/28/2020   HDL 50 01/28/2020   LDLCALC 61 01/28/2020   ALT 23 01/28/2020   AST 31 01/28/2020   NA 143 01/28/2020   K 4.3 01/28/2020   CL 113 (H) 01/28/2020   CREATININE 1.69 (H) 01/28/2020   BUN 29 (H) 01/28/2020   CO2 26 01/28/2020   TSH 0.243 (L) 06/06/2019   PSA 4.48 (H) 06/16/2015   INR 1.0 06/05/2019   HGBA1C 7.9 (H) 11/26/2019   MICROALBUR 2.5 (H) 12/20/2018     BNP (last 3 results) No results for input(s): BNP in the last 8760 hours.  ProBNP (last 3 results) Recent Labs    03/05/19 1101  PROBNP 1,543.0*      Prior CV studies:    The following studies were reviewed today:  ECHO 10/10/19 1. Left ventricular ejection fraction, by estimation, is 60 to 65%. The  left ventricle has normal function. The left ventricle has no regional  wall motion abnormalities. There is severe left ventricular hypertrophy.  Left ventricular diastolic parameters  are consistent with Grade II diastolic dysfunction (pseudonormalization).  Elevated left atrial pressure.  2. Right ventricular systolic function is normal. The right ventricular  size is  mildly enlarged. There is mildly elevated pulmonary artery  systolic pressure.  3. Left atrial size was severely dilated.  4. Right atrial size was moderately dilated.  5. 1.5 to 2.0cm pericardial effusion. Moderate pericardial effusion. The  pericardial effusion is circumferential. There is no evidence of cardiac  tamponade.  6. The mitral valve is normal in structure. Trivial mitral valve  regurgitation. No evidence of mitral stenosis.  7. The aortic valve is normal in structure. Aortic valve regurgitation is  not visualized. No aortic stenosis is present.  8. There is mild dilatation of the ascending aorta measuring 40 mm.  9. The inferior vena cava is dilated in size with >50% respiratory  variability, suggesting right atrial pressure of 8 mmHg.   Comparison(s): When compared to 2019, pericardial effusion is slightly  worse (but was present 2 years ago and appears chronic).   Nuclear stress test on 10/10/19  NUC: Nuclear stress EF: 37%. The left ventricular ejection fraction is moderately decreased (30-44%). The LV is severely dilated.  There was no ST segment deviation noted during stress. This is an intermediate risk study. There is no evidence of ischemia or previous infarction  Per Dr. Marlou Porch - "nuclear stress test ejection fraction does not appear to be reliable in his setting.  His echocardiogram is reassuring with normal EF"     ASSESSMENT & PLAN:    1. Persistent DOE - this does not really seem to be too much of an issue at this time. He has normal EF by echo - reassuring Myoview earlier this year. This seems to have improved.   2. Known sinus bradycardia - unknown HR today - he is not dizzy or lightheaded.   3. HTN - BP is fair - would follow. On multiple agents - would continue with current regimen.  4. Chronic pericardial effusion - no indication for intervention at this time.   5. Severe LVH - needs to continue with good BP control.   6. Prior COVID  19 illness - seems to continue to be progressing.    Patient Risk:   After full review of this patient's clinical status, I feel that they are at least moderate risk at this time.  Time:   Today, I have spent 5 minutes with the patient with telehealth technology discussing the above issues.     Medication Adjustments/Labs and Tests Ordered: Current medicines are reviewed at length with the patient today.  Concerns regarding medicines are outlined above.   Tests Ordered: No orders of the defined types were placed in this encounter.   Medication Changes: No orders of the defined types were placed in this encounter.   Disposition:  FU with Dr. Marlou Porch in about 4 to 6 months for in person visit.    Patient is agreeable to this plan and will call if any problems develop in the interim.   Amie Critchley, NP  03/01/2020 10:16 AM    [place]

## 2020-03-01 ENCOUNTER — Telehealth: Payer: Medicare HMO | Admitting: Nurse Practitioner

## 2020-03-01 ENCOUNTER — Other Ambulatory Visit: Payer: Self-pay

## 2020-03-01 ENCOUNTER — Other Ambulatory Visit (INDEPENDENT_AMBULATORY_CARE_PROVIDER_SITE_OTHER): Payer: Medicare HMO

## 2020-03-01 ENCOUNTER — Encounter: Payer: Self-pay | Admitting: Nurse Practitioner

## 2020-03-01 ENCOUNTER — Telehealth: Payer: Self-pay | Admitting: *Deleted

## 2020-03-01 ENCOUNTER — Telehealth (INDEPENDENT_AMBULATORY_CARE_PROVIDER_SITE_OTHER): Payer: Medicare HMO | Admitting: Nurse Practitioner

## 2020-03-01 VITALS — BP 140/80 | Ht 66.0 in | Wt 188.0 lb

## 2020-03-01 DIAGNOSIS — I119 Hypertensive heart disease without heart failure: Secondary | ICD-10-CM | POA: Diagnosis not present

## 2020-03-01 DIAGNOSIS — R0609 Other forms of dyspnea: Secondary | ICD-10-CM

## 2020-03-01 DIAGNOSIS — I517 Cardiomegaly: Secondary | ICD-10-CM | POA: Diagnosis not present

## 2020-03-01 DIAGNOSIS — I313 Pericardial effusion (noninflammatory): Secondary | ICD-10-CM

## 2020-03-01 DIAGNOSIS — E119 Type 2 diabetes mellitus without complications: Secondary | ICD-10-CM | POA: Diagnosis not present

## 2020-03-01 DIAGNOSIS — R0600 Dyspnea, unspecified: Secondary | ICD-10-CM

## 2020-03-01 DIAGNOSIS — R001 Bradycardia, unspecified: Secondary | ICD-10-CM

## 2020-03-01 DIAGNOSIS — I3139 Other pericardial effusion (noninflammatory): Secondary | ICD-10-CM

## 2020-03-01 LAB — BASIC METABOLIC PANEL
BUN: 27 mg/dL — ABNORMAL HIGH (ref 6–23)
CO2: 26 mEq/L (ref 19–32)
Calcium: 10.1 mg/dL (ref 8.4–10.5)
Chloride: 109 mEq/L (ref 96–112)
Creatinine, Ser: 1.83 mg/dL — ABNORMAL HIGH (ref 0.40–1.50)
GFR: 36.09 mL/min — ABNORMAL LOW (ref >60.00)
Glucose, Bld: 123 mg/dL — ABNORMAL HIGH (ref 70–99)
Potassium: 4.3 mEq/L (ref 3.5–5.1)
Sodium: 142 mEq/L (ref 135–145)

## 2020-03-01 LAB — HEMOGLOBIN A1C: Hgb A1c MFr Bld: 6.7 % — ABNORMAL HIGH (ref 4.6–6.5)

## 2020-03-01 NOTE — Telephone Encounter (Signed)
  Patient Consent for Virtual Visit         Corey [name] has provided verbal consent on 03/01/2020 for a virtual visit (video or telephone).   CONSENT FOR VIRTUAL VISIT FOR:  Corey [name]  By participating in this virtual visit I agree to the following:  I hereby voluntarily request, consent and authorize [facility] and its employed or contracted physicians, physician assistants, nurse practitioners or other licensed health care professionals (the Practitioner), to provide me with telemedicine health care services (the "Services") as deemed necessary by the treating Practitioner. I acknowledge and consent to receive the Services by the Practitioner via telemedicine. I understand that the telemedicine visit will involve communicating with the Practitioner through live audiovisual communication technology and the disclosure of certain medical information by electronic transmission. I acknowledge that I have been given the opportunity to request an in-person assessment or other available alternative prior to the telemedicine visit and am voluntarily participating in the telemedicine visit.  I understand that I have the right to withhold or withdraw my consent to the use of telemedicine in the course of my care at any time, without affecting my right to future care or treatment, and that the Practitioner or I may terminate the telemedicine visit at any time. I understand that I have the right to inspect all information obtained and/or recorded in the course of the telemedicine visit and may receive copies of available information for a reasonable fee.  I understand that some of the potential risks of receiving the Services via telemedicine include:  Delay or interruption in medical evaluation due to technological equipment failure or disruption; Information transmitted may not be sufficient (e.g. poor resolution of images) to allow for appropriate medical decision making by the Practitioner; and/or    In rare instances, security protocols could fail, causing a breach of personal health information.  Furthermore, I acknowledge that it is my responsibility to provide information about my medical history, conditions and care that is complete and accurate to the best of my ability. I acknowledge that Practitioner's advice, recommendations, and/or decision may be based on factors not within their control, such as incomplete or inaccurate data provided by me or distortions of diagnostic images or specimens that may result from electronic transmissions. I understand that the practice of medicine is not an exact science and that Practitioner makes no warranties or guarantees regarding treatment outcomes. I acknowledge that a copy of this consent can be made available to me via my patient portal ([facility]), or I can request a printed copy by calling the office of [facility].    I understand that my insurance will be billed for this visit.   I have read or had this consent read to me. I understand the contents of this consent, which adequately explains the benefits and risks of the Services being provided via telemedicine.  I have been provided ample opportunity to ask questions regarding this consent and the Services and have had my questions answered to my satisfaction. I give my informed consent for the services to be provided through the use of telemedicine in my medical care

## 2020-03-01 NOTE — Patient Instructions (Addendum)
After Visit Summary:  We will be checking the following labs today - NONE   Medication Instructions:    Continue with your current medicines.    If you need a refill on your cardiac medications before your next appointment, please call your pharmacy.     Testing/Procedures To Be Arranged:  N/A  Follow-Up:   See Dr. Marlou Porch in 4 to 6 months.    At [facility], you and your health needs are our priority.  As part of our continuing mission to provide you with exceptional heart care, we have created designated Provider Care Teams.  These Care Teams include your primary Cardiologist (physician) and Advanced Practice Providers (APPs -  Physician Assistants and Nurse Practitioners) who all work together to provide you with the care you need, when you need it.  Special Instructions:  . Stay safe, wash your hands for at least 20 seconds and wear a mask when needed.  . It was good to talk with you today.    Call the [place] office at [phone] if you have any questions, problems or concerns.

## 2020-03-02 LAB — FRUCTOSAMINE: Fructosamine: 287 umol/L — ABNORMAL HIGH (ref 0–285)

## 2020-03-03 ENCOUNTER — Ambulatory Visit (INDEPENDENT_AMBULATORY_CARE_PROVIDER_SITE_OTHER): Payer: Medicare HMO | Admitting: Endocrinology

## 2020-03-03 ENCOUNTER — Encounter: Payer: Self-pay | Admitting: Endocrinology

## 2020-03-03 ENCOUNTER — Other Ambulatory Visit: Payer: Self-pay

## 2020-03-03 VITALS — BP 100/62 | HR 99 | Ht 66.0 in | Wt 187.8 lb

## 2020-03-03 DIAGNOSIS — N289 Disorder of kidney and ureter, unspecified: Secondary | ICD-10-CM

## 2020-03-03 DIAGNOSIS — E1122 Type 2 diabetes mellitus with diabetic chronic kidney disease: Secondary | ICD-10-CM

## 2020-03-03 DIAGNOSIS — I10 Essential (primary) hypertension: Secondary | ICD-10-CM

## 2020-03-03 DIAGNOSIS — Z794 Long term (current) use of insulin: Secondary | ICD-10-CM

## 2020-03-03 LAB — MICROALBUMIN / CREATININE URINE RATIO
Creatinine,U: 86.6 mg/dL
Microalb Creat Ratio: 2.7 mg/g (ref 0.0–30.0)
Microalb, Ur: 2.4 mg/dL — ABNORMAL HIGH (ref 0.0–1.9)

## 2020-03-03 NOTE — Progress Notes (Signed)
Patient ID: Corey [name], male   DOB: 1945-12-01, 74 y.o.   MRN: [id number]   Reason for Appointment : Followup of Type 2 Diabetes   History of Present Illness          Diagnosis: Type 2 diabetes mellitus, date of diagnosis: 1997      Past history: The patient was initially treated with various oral hypoglycemic drugs and details are not available. About 2009 he was started on insulin and has either taken premixed insulin or Lantus once a day He had difficulty affording his Lantus insulin and had preferred to take premixed insulin Subsequently insulin was tapered off  Recent history:    Oral hypoglycemic drugs: Metformin ER 750 mg twice daily, Amaryl 0.5 mg at bedtime  Current blood sugar patterns and problems:  His A1c is slightly improved back at 6.7 compared to 7.9  Previous fructosamine was 320    His blood sugars recently are well controlled although checking less than once a day  Not clear why his A1c was higher before and his medications were continued unchanged on his last visit in July  Recent blood sugar range 85-129 with most readings around midday or morning and a couple of readings after 8 PM which is normal also  Lab glucose was 123  No hypoglycemia by history   Previously had decreased appetite but now his weight has leveled off  He is trying to do a little walking but still does not feel motivated to do so   Glucose monitoring:  done less than 1 time a day         Glucometer:  Accu-Chek      Blood Glucose readings from download: As above  Previous range  PRE-MEAL Fasting Lunch Dinner Bedtime Overall  Glucose range:  91-192  102-136  75, 83   75-192  Mean/median:  111    110   POST-MEAL PC Breakfast PC Lunch PC Dinner  Glucose range:    85-151  Mean/median:    104     Self-care:       Meals: 2-3 meals per day. Egg in am at 9-10 am or grits, rarely sandwich at lunch. His largest meal is at suppertime at about 8 pm.  Avoiding regular  soft drinks  Physical activity: exercise: Walking 30 minutes; 3/7  days        Dietician visit: Most recent: 02/2014               Compliance with the medical regimen:  Fair.    Wt Readings from Last 3 Encounters:  03/03/20 187 lb 12.8 oz (85.2 kg)  03/01/20 188 lb (85.3 kg)  01/28/20 188 lb 9.6 oz (85.5 kg)    Lab Results  Component Value Date   HGBA1C 6.7 (H) 03/01/2020   HGBA1C 7.9 (H) 11/26/2019   HGBA1C 8.1 (H) 08/01/2019    Lab Results  Component Value Date   MICROALBUR 2.5 (H) 12/20/2018   LDLCALC 61 01/28/2020   CREATININE 1.83 (H) 03/01/2020   Lab on 03/01/2020  Component Date Value Ref Range Status   Fructosamine 03/01/2020 287* 0 - 285 umol/L Final   Comment: Published reference interval for apparently healthy subjects between age 70 and 24 is 74 - 285 umol/L and in a poorly controlled diabetic population is 228 - 563 umol/L with a mean of 396 umol/L.    Sodium 03/01/2020 142  135 - 145 mEq/L Final   Potassium 03/01/2020 4.3  3.5 - 5.1  mEq/L Final   Chloride 03/01/2020 109  96 - 112 mEq/L Final   CO2 03/01/2020 26  19 - 32 mEq/L Final   Glucose, Bld 03/01/2020 123* 70 - 99 mg/dL Final   BUN 03/01/2020 27* 6 - 23 mg/dL Final   Creatinine, Ser 03/01/2020 1.83* 0.40 - 1.50 mg/dL Final   GFR 03/01/2020 36.09* >60.00 mL/min Final   Calcium 03/01/2020 10.1  8.4 - 10.5 mg/dL Final   Hgb A1c MFr Bld 03/01/2020 6.7* 4.6 - 6.5 % Final   Glycemic Control Guidelines for People with Diabetes:Non Diabetic:  <6%Goal of Therapy: <7%Additional Action Suggested:  >8%      Allergies as of 03/03/2020      Reactions   Hydrochlorothiazide Other (See Comments)   Gout , uncontrolled diabetes and renal insufficiency   Allopurinol Other (See Comments)   Renal insufficiency      Medication List       Accurate as of March 03, 2020  2:34 PM. If you have any questions, ask your nurse or doctor.        amLODipine 10 MG tablet Commonly known as: NORVASC Take  1 tablet (10 mg total) by mouth daily.   aspirin EC 81 MG tablet Take 1 tablet (81 mg total) by mouth daily.   cloNIDine 0.3 MG tablet Commonly known as: CATAPRES Take 1 tablet (0.3 mg total) by mouth 2 (two) times daily.   colchicine 0.6 MG tablet Commonly known as: Colcrys Take 2 tabs once and then one tab one hour later as needed for gout   furosemide 20 MG tablet Commonly known as: LASIX Take 1 tablet by mouth once daily in the morning   glimepiride 1 MG tablet Commonly known as: AMARYL Take 0.5 tablets (0.5 mg total) by mouth at bedtime.   hydrALAZINE 50 MG tablet Commonly known as: APRESOLINE TAKE 1 AND 1/2 TABLETS THREE TIMES DAILY   isosorbide mononitrate 60 MG 24 hr tablet Commonly known as: IMDUR Take 1 tablet (60 mg total) by mouth 2 (two) times daily.   losartan 100 MG tablet Commonly known as: COZAAR TAKE 1 TABLET EVERY DAY   metFORMIN 750 MG 24 hr tablet Commonly known as: GLUCOPHAGE-XR TAKE 1 TABLET TWICE DAILY   pioglitazone 15 MG tablet Commonly known as: Actos Take 1 tablet (15 mg total) by mouth daily.       Allergies:  Allergies  Allergen Reactions   Hydrochlorothiazide Other (See Comments)    Gout , uncontrolled diabetes and renal insufficiency   Allopurinol Other (See Comments)    Renal insufficiency    Past Medical History:  Diagnosis Date   Cataract    Colon polyp 2003   Dr [name]; F/U was to be 2008( not completed)   CVA (cerebral infarction) 2014   Diabetes mellitus    Diverticulosis 2003   Dyspnea    with exertion   Gout    Hypertension    Myocardial infarction ([place]) 2014   OSA (obstructive sleep apnea) 03/27/2018   Pneumonia 09/29/11   Avelox X 10 days as OP   Prostate cancer ([place])    Renal insufficiency    Seizures ([place])    not treated for seizure disorder; had a seizure after stroke 2014; no seizure since then    Past Surgical History:  Procedure Laterality Date   COLONOSCOPY W/ POLYPECTOMY   2003   no F/U ([place] discussed 12/03/12)   INSERTION OF MESH N/A 07/16/2017   Procedure: INSERTION OF MESH;  Surgeon: Kae Heller,  Victorino Sparrow, MD;  Location: [facility];  Service: General;  Laterality: N/A;   PEG PLACEMENT  2014   PEG TUBE REMOVAL  2014   PROSTATE BIOPSY     TRACHEOSTOMY  2014   was closed after hospital stay   VENTRAL HERNIA REPAIR N/A 07/16/2017   Procedure: [place];  Surgeon: [name], MD;  Location: [facility];  Service: General;  Laterality: N/A;   wrist aspiration  02/16/12    monosodium urate crystals; Dr Caralyn Guile    Family History  Problem Relation Age of Onset   Hypertension Father    Prostate cancer Father    Diabetes Father    Cancer Father    Hypertension Mother    Diabetes Mother    Stroke Neg Hx    Heart disease Neg Hx    Colon cancer Neg Hx     Social History:  reports that he quit smoking about 51 years ago. His smoking use included cigarettes. He has a 0.25 pack-year smoking history. He has never used smokeless tobacco. He reports that he does not drink alcohol and does not use drugs.    Review of Systems   Lipids: Has normal levels and  not on a statin drug  Lab Results  Component Value Date   CHOL 125 01/28/2020   HDL 50 01/28/2020   LDLCALC 61 01/28/2020   TRIG 55 01/28/2020   CHOLHDL 2.5 01/28/2020    Eye exams: Last done in 1/20 without retinopathy     He has renal insufficiency of unclear etiology with variable creatinine levels as follows:  Lab Results  Component Value Date   CREATININE 1.83 (H) 03/01/2020   CREATININE 1.69 (H) 01/28/2020   CREATININE 1.69 (H) 11/26/2019     HYPERTENSION: Currently on losartan, amlodipine 5, clonidine 0.3 mg and hydralazine as well as metoprolol He also takes Lasix reportedly for edema  Not checking at home even though he has a meter  He has been followed by PCP for this  BP Readings from Last 3 Encounters:  03/03/20 100/62  03/01/20 140/80   01/28/20 (!) 142/68    Foot exam done in 3/20 with PCP   Physical Examination:  BP 100/62 (Patient Position: Standing)    Pulse 99    Ht 5\' 6"  (1.676 m)    Wt 187 lb 12.8 oz (85.2 kg)    SpO2 98%    BMI 30.31 kg/m     ASSESSMENT/PLAN:  Diabetes type 2, uncontrolled   See history of present illness for discussion of current diabetes management, blood sugar patterns and problems identified  His A1c is much better at 6.7 Fructosamine is also reasonably good at 287  He is on metformin 1500 mg daily and 0.5 mg Amaryl at bedtime He has excellent blood sugars at home without hypoglycemia although checking infrequently His wife is helping him with compliance with medications and diet He is trying to do a lot of walking although limited  Since his renal function is worse he will need to reduce his Metformin to 1 tablet daily He will call if he has unusually high readings   Fatigue and lack of motivation: He will discuss this with his PCP and needs to call for evaluation   HYPERTENSION: He is orthostatic and blood pressure is relatively low Currently on several high dose medications Not clear if he needs to be on Lasix also  Renal dysfunction: Creatinine is rising Since he likely has low blood pressure  causing the renal dysfunction he will reduce his LOSARTAN down to half tablet daily Also take Lasix only if needed for edema and not a daily basis  Follow-up in 6 weeks for renal function, consider renal consultation Also needs microalbumin followed up  Patient Instructions  Reduce Metformin to 1 daily  Losartan 100mg  take 1/2 daily  Furosemide take as needed  For swelling    Elayne Snare 03/03/2020, 2:34 PM

## 2020-03-03 NOTE — Patient Instructions (Addendum)
Reduce Metformin to 1 daily  Losartan 100mg  take 1/2 daily  Furosemide take as needed  For swelling

## 2020-03-05 ENCOUNTER — Telehealth: Payer: Self-pay | Admitting: Endocrinology

## 2020-03-05 NOTE — Telephone Encounter (Signed)
We have adjusted his medications and if his kidney test is still not improved we can then send him to Nephrology

## 2020-03-05 NOTE — Telephone Encounter (Signed)
Patient requests to be called at ph# [phone] re: Patient states that at his appointment with Dr. Dwyane Dee on 03/03/20, Dr. Dwyane Dee mentioned patient is having problems with his Kidney and patient would like to be advised if Dr. Dwyane Dee wants patient to see a Kidney Specialist (Referral?).

## 2020-03-05 NOTE — Telephone Encounter (Signed)
Please see below and advise.

## 2020-03-05 NOTE — Telephone Encounter (Signed)
Noted, patient is aware and agreed

## 2020-03-15 ENCOUNTER — Other Ambulatory Visit: Payer: Self-pay

## 2020-03-15 NOTE — Patient Outreach (Signed)
[facility]) Care Management  03/15/2020  Corey [name] 22-Feb-1946 [id number]     Telephone Assessment   Outreach attempt #   to patient. No answer. RN CM left HIPAA compliant voicemail message along with contact info.    Plan: RN CM will make outreach attempt to patient within the month of Dec if no return call.  Corey Montgomery, RN,BSN,CCM [facility] Management Telephonic Care Management Coordinator Direct Phone: [phone] Toll Free: [phone] Fax: [phone]

## 2020-03-16 ENCOUNTER — Telehealth: Payer: Self-pay | Admitting: Internal Medicine

## 2020-03-16 ENCOUNTER — Other Ambulatory Visit: Payer: Self-pay

## 2020-03-16 DIAGNOSIS — M10072 Idiopathic gout, left ankle and foot: Secondary | ICD-10-CM

## 2020-03-16 MED ORDER — COLCHICINE 0.6 MG PO TABS: ORAL_TABLET | ORAL | 2 refills | Status: DC

## 2020-03-16 NOTE — Telephone Encounter (Signed)
Patient is requesting a med refill for colchicine (COLCRYS) 0.6 MG tablet It can be sent to [place], [place]   Please call patient when rx is sent: [phone]

## 2020-03-16 NOTE — Telephone Encounter (Signed)
Script sent in today and message left for patient.

## 2020-03-17 ENCOUNTER — Other Ambulatory Visit: Payer: Self-pay

## 2020-03-17 ENCOUNTER — Ambulatory Visit: Payer: Medicare HMO | Admitting: Pharmacist

## 2020-03-17 DIAGNOSIS — Z794 Long term (current) use of insulin: Secondary | ICD-10-CM

## 2020-03-17 DIAGNOSIS — I10 Essential (primary) hypertension: Secondary | ICD-10-CM

## 2020-03-17 DIAGNOSIS — M1A9XX0 Chronic gout, unspecified, without tophus (tophi): Secondary | ICD-10-CM

## 2020-03-17 NOTE — Chronic Care Management (AMB) (Signed)
Chronic Care Management Pharmacy  Name: LOGYN KENDRICK  MRN: [id number] DOB: 1945/11/13   Chief Complaint/ HPI   Corey [name],  74 y.o. , male presents for their Initial CCM visit with the clinical pharmacist via telephone due to COVID-19 Pandemic.  PCP : [name], MD Patient Care Team: [name], MD as PCP - General (Internal Medicine) Jerline Pain, MD as PCP - Cardiology (Cardiology) Florance, Tomasa Blase, RN as [facility] [place], [place], [facility] as Pharmacist (Pharmacist)  Their chronic conditions include: Hypertension, Diabetes, Chronic Kidney Disease, Overactive Bladder and Gout, Mild cognitive impairment, OSA  Office Visits: 01/28/20 [name] OV: chronic f/u, no med changes.  Consult Visit: 03/03/20 Dr Dwyane Dee (endocrine): reduce metformin to 1 tablet daily  03/01/20 NP Truitt Merle (cardiology): f/u for DOE, HTN, sinus bradycardia, LVH. Pt stable, no med changes.   Allergies  Allergen Reactions  . Hydrochlorothiazide Other (See Comments)    Gout , uncontrolled diabetes and renal insufficiency    Medications: Outpatient Encounter Medications as of 03/17/2020  Medication Sig  . amLODipine (NORVASC) 10 MG tablet Take 1 tablet (10 mg total) by mouth daily.  Marland Kitchen aspirin EC 81 MG tablet Take 1 tablet (81 mg total) by mouth daily.  . cloNIDine (CATAPRES) 0.3 MG tablet Take 1 tablet (0.3 mg total) by mouth 2 (two) times daily.  . colchicine (COLCRYS) 0.6 MG tablet Take 2 tabs once and then one tab one hour later as needed for gout  . furosemide (LASIX) 20 MG tablet Take 1 tablet by mouth once daily in the morning  . glimepiride (AMARYL) 1 MG tablet Take 0.5 tablets (0.5 mg total) by mouth at bedtime. (Patient taking differently: Take 1 mg by mouth at bedtime. )  . hydrALAZINE (APRESOLINE) 50 MG tablet TAKE 1 AND 1/2 TABLETS THREE TIMES DAILY  . isosorbide mononitrate (IMDUR) 60 MG 24 hr tablet Take 1 tablet (60 mg total) by  mouth 2 (two) times daily.  Marland Kitchen losartan (COZAAR) 100 MG tablet TAKE 1 TABLET EVERY DAY  . metFORMIN (GLUCOPHAGE-XR) 750 MG 24 hr tablet TAKE 1 TABLET TWICE DAILY (Patient taking differently: Take 750 mg by mouth daily with breakfast. )  . pioglitazone (ACTOS) 15 MG tablet Take 1 tablet (15 mg total) by mouth daily. (Patient not taking: Reported on 03/17/2020)   No facility-administered encounter medications on file as of 03/17/2020.    Wt Readings from Last 3 Encounters:  03/03/20 187 lb 12.8 oz (85.2 kg)  03/01/20 188 lb (85.3 kg)  01/28/20 188 lb 9.6 oz (85.5 kg)    Current Diagnosis/Assessment:  SDOH Interventions     Most Recent Value  SDOH Interventions  Financial Strain Interventions Intervention Not Indicated      Goals Addressed            This Visit's Progress   . Pharmacy Care Plan       CARE PLAN ENTRY (see longitudinal plan of care for additional care plan information)  Current Barriers:  . Chronic Disease Management support, education, and care coordination needs related to Hypertension, Diabetes, and Gout   Hypertension BP Readings from Last 3 Encounters:  03/03/20 100/62  03/01/20 140/80  01/28/20 (!) 142/68 .  Pharmacist Clinical Goal(s): o Over the next 90 days, patient will work with PharmD and providers to achieve BP goal <140/90 . Current regimen:  o Amlodipine 10 mg daily at bedtime o Clonidine 0.3 mg twice a day o Furosemide 20  mg daily o Hydralazine 50 mg 1.5 tab 3 times daily o Isosorbide MN 60 mg twice a day o Losartan 100 mg daily AM . Interventions: o Discussed BP goals and benefits of medications for prevention of heart attack / stroke o Patient was taking hydralazine 1 tablet 3 times daily and isosorbide once a day; counseled on prescribed doses and advised to increase doses as prescribed o Discussed indication for furosemide (swelling), after stopping pioglitazone patient denies significant issues with swelling, recommend trial off  furosemide to ascertain need . Patient self care activities - Over the next 90 days, patient will: o Check BP daily, document, and provide at future appointments o Ensure daily salt intake < 2300 mg/day o Increase hydralazine to 1.5 tablets 3 times daily (as prescribed) and isosorbide to twice daily (as prescribed) o Trial off furosemide to ascertain need  Diabetes Lab Results  Component Value Date/Time   HGBA1C 6.7 (H) 03/01/2020 09:09 AM   HGBA1C 7.9 (H) 11/26/2019 09:06 AM .  Pharmacist Clinical Goal(s): o Over the next 90 days, patient will work with PharmD and providers to maintain A1c goal <7% . Current regimen:  o Metformin ER 750 mg daily o Glimepiride 1 mg at bedtime . Interventions: o Discussed importance of maintaining sugars at goal to prevent complications of diabetes including kidney damage, retinal damage, and cardiovascular disease o Discussed recent dose decrease for metformin due to kidney function decline o Discussed risk of hypoglycemia with glimepiride and importance of not skipping meals . Patient self care activities - Over the next 90 days, patient will: o Check blood sugar twice daily, document, and provide at future appointments o Contact provider with any episodes of hypoglycemia  Gout . Pharmacist Clinical Goal(s) o Over the next 90 days, patient will work with PharmD and providers to optimize therapy . Current regimen:  o Colchicine 0.6 mg as needed for gout flare . Interventions: o Discussed benefits of allopurinol for prevention of flares o Recommend allopurinol 50 mg daily . Patient self care activities - Over the next 90 days, patient will: o Take colchicine as directed for gout flares  Medication management . Pharmacist Clinical Goal(s): o Over the next 90 days, patient will work with PharmD and providers to maintain optimal medication adherence . Current pharmacy: [facility] mail order . Interventions o Comprehensive medication review  performed. o Continue current medication management strategy . Patient self care activities - Over the next 90 days, patient will: o Focus on medication adherence by pill box o Take medications as prescribed o Report any questions or concerns to PharmD and/or provider(s)  Initial goal documentation       Hypertension   BP goal is:  <140/90  Office blood pressures are  BP Readings from Last 3 Encounters:  03/03/20 100/62  03/01/20 140/80  01/28/20 (!) 142/68   Lab Results  Component Value Date   CREATININE 1.83 (H) 03/01/2020   BUN 27 (H) 03/01/2020   GFR 36.09 (L) 03/01/2020   GFRNONAA 40 (L) 01/28/2020   GFRAA 46 (L) 01/28/2020   NA 142 03/01/2020   K 4.3 03/01/2020   CALCIUM 10.1 03/01/2020   CO2 26 03/01/2020   Patient checks BP at home weekly Patient home BP readings are ranging: 140/80  Patient has failed these meds in the past: n/a Patient is currently controlled on the following medications:  . Amlodipine 10 mg daily HS . Clonidine 0.3 mg BID  . Furosemide 20 mg daily . Hydralazine 50 mg 1.5 tab  TID . Isosorbide MN 60 mg BID . Losartan 100 mg daily AM  We discussed diet and exercise extensively; pt walks ~2 miles 3 days per week; - effect of salt on BP / swelling - advised < 2000 mg/day of salt; discussed BP goal and benefits of medications;  -pt was not taking all medications as prescribed - hydralazine only 1 tab TID and isosorbide only once daily, counseled on prescribed dosing and pt agreed to alter accordingly;  -indication for furosemide is swelling - pt denies significant swelling especially after stopping pioglitazone which can cause swelling; advised to hold furosemide for a few days to see if swelling can be controlled with low salt diet/exercise   Plan  Increase hydralazine and isosorbide to prescribed doses Recommend low salt diet < 2000 mg daily Recommend trial off furosemide to ascertain need (pt denies significant swelling since stopping  Actos)  Cardiovascular risk reduction   LDL goal < 70 Hx stroke 2011  Last lipids Lab Results  Component Value Date   CHOL 125 01/28/2020   HDL 50 01/28/2020   LDLCALC 61 01/28/2020   TRIG 55 01/28/2020   CHOLHDL 2.5 01/28/2020   Hepatic Function Latest Ref Rng & Units 01/28/2020 11/26/2019 08/01/2019  Total Protein 6.1 - 8.1 g/dL 5.7(L) 6.6 6.0  Albumin 3.5 - 5.2 g/dL - 4.0 3.5  AST 10 - 35 U/L 31 25 34  ALT 9 - 46 U/L 23 15 34  Alk Phosphatase 39 - 117 U/L - 64 89  Total Bilirubin 0.2 - 1.2 mg/dL 0.7 0.7 0.7  Bilirubin, Direct 0.0 - 0.3 mg/dL - - -    The ASCVD Risk score Mikey Bussing DC Jr., et al., 2013) failed to calculate for the following reasons:   The patient has a prior MI or stroke diagnosis   Patient has failed these meds in past: n/a Patient is currently controlled on the following medications:  . Aspirin 81 mg daily  We discussed:  diet and exercise extensively; a statin is indicated due to diabetes however LDL is <70 so true need is uncertain; it is reasonable to continue without statin for time being  Plan  Continue control with diet and exercise   Diabetes   A1c goal <7%  Recent Relevant Labs: Lab Results  Component Value Date/Time   HGBA1C 6.7 (H) 03/01/2020 09:09 AM   HGBA1C 7.9 (H) 11/26/2019 09:06 AM   GFR 36.09 (L) 03/01/2020 09:09 AM   GFR 48.56 (L) 11/26/2019 09:06 AM   MICROALBUR 2.4 (H) 03/03/2020 11:09 AM   MICROALBUR 2.5 (H) 12/20/2018 09:38 AM    Last diabetic Eye exam:  Lab Results  Component Value Date/Time   HMDIABEYEEXA No Retinopathy 05/27/2018 12:00 AM    Last diabetic Foot exam: No results found for: HMDIABFOOTEX   Checking BG: 2x per Day  Recent FBG Readings: 103 Recent HS BG readings: 85  Patient has failed these meds in past: n/a Patient is currently controlled on the following medications: Marland Kitchen Metformin ER 750 mg daily . Glimepiride 1 mg - 1 tab HS . Pioglitazone 15 mg daily - not taking  We discussed: diet and  exercise extensively and how to recognize and treat signs of hypoglycemia;  -discussed recent dose decrease in metformin due to decline in kidney function to GFR < 45;  -pt reports he is no longer taking pioglitazone; he denies significant issues with LE edema since stopping med -patient is taking a whole tablet of glimepiride instead of prescribed 1/2 tab, denies significant  issues with hypoglycemia, reported BG is well controlled so will continue current meds at current doses  Plan  Continue current medications and control with diet and exercise  Remove pioglitazone from medication list  Gout    Ref. Range 07/23/2018 11:39  Uric Acid, Serum Latest Ref Range: 4.0 - 7.8 mg/dL 6.9   Patient has failed these meds in past: n/a Patient is currently uncontrolled on the following medications:  . Colchicine 0.6 mg PRN  We discussed:  Pt reports gout flares about once a month; allopurinol is listed in chart as allergy due to "renal insufficiency" since 2013; however more recent data supports use of allopurinol at lower doses even when GFR < 30 so now it is an appropriate choice for gout management  Plan  Continue current medications  Recommend allopurinol 50 mg daily (renal-adjusted dose)  Medication Management   Pt uses [facility] for all medications Uses pill box? Yes Pt endorses 99% compliance  We discussed: Current pharmacy is preferred with insurance plan and patient is satisfied with pharmacy services  Plan  Continue current medication management strategy    Follow up: 3 month phone visit  Charlene Brooke, PharmD, BCACP Clinical Pharmacist [facility] [phone]

## 2020-03-18 ENCOUNTER — Telehealth: Payer: Self-pay | Admitting: Internal Medicine

## 2020-03-18 MED ORDER — ALLOPURINOL 100 MG PO TABS: 50.0000 mg | ORAL_TABLET | Freq: Every day | ORAL | 1 refills | Status: DC

## 2020-03-18 NOTE — Telephone Encounter (Signed)
Allopurinol to be started at 50 mg daily -- this is related to his visit with Ria Comment -- this will help prevent gout.    Sent to pharmacy.

## 2020-03-18 NOTE — Patient Instructions (Addendum)
Visit Information  Phone number for Pharmacist: [phone]  Thank you for meeting with me to discuss your medications! I look forward to working with you to achieve your health care goals. Below is a summary of what we talked about during the visit:  Goals Addressed            This Visit's Progress   . Pharmacy Care Plan       CARE PLAN ENTRY (see longitudinal plan of care for additional care plan information)  Current Barriers:  . Chronic Disease Management support, education, and care coordination needs related to Hypertension, Diabetes, and Gout   Hypertension BP Readings from Last 3 Encounters:  03/03/20 100/62  03/01/20 140/80  01/28/20 (!) 142/68 .  Pharmacist Clinical Goal(s): o Over the next 90 days, patient will work with PharmD and providers to achieve BP goal <140/90 . Current regimen:  o Amlodipine 10 mg daily at bedtime o Clonidine 0.3 mg twice a day o Furosemide 20 mg daily o Hydralazine 50 mg 1.5 tab 3 times daily o Isosorbide MN 60 mg twice a day o Losartan 100 mg daily AM . Interventions: o Discussed BP goals and benefits of medications for prevention of heart attack / stroke o Patient was taking hydralazine 1 tablet 3 times daily and isosorbide once a day; counseled on prescribed doses and advised to increase doses as prescribed o Discussed indication for furosemide (swelling), after stopping pioglitazone patient denies significant issues with swelling, recommend trial off furosemide to ascertain need . Patient self care activities - Over the next 90 days, patient will: o Check BP daily, document, and provide at future appointments o Ensure daily salt intake < 2300 mg/day o Increase hydralazine to 1.5 tablets 3 times daily (as prescribed) and isosorbide to twice daily (as prescribed) o Trial off furosemide to ascertain need  Diabetes Lab Results  Component Value Date/Time   HGBA1C 6.7 (H) 03/01/2020 09:09 AM   HGBA1C 7.9 (H) 11/26/2019 09:06 AM  .  Pharmacist Clinical Goal(s): o Over the next 90 days, patient will work with PharmD and providers to maintain A1c goal <7% . Current regimen:  o Metformin ER 750 mg daily o Glimepiride 1 mg at bedtime . Interventions: o Discussed importance of maintaining sugars at goal to prevent complications of diabetes including kidney damage, retinal damage, and cardiovascular disease o Discussed recent dose decrease for metformin due to kidney function decline o Discussed risk of hypoglycemia with glimepiride and importance of not skipping meals . Patient self care activities - Over the next 90 days, patient will: o Check blood sugar twice daily, document, and provide at future appointments o Contact provider with any episodes of hypoglycemia  Gout . Pharmacist Clinical Goal(s) o Over the next 90 days, patient will work with PharmD and providers to optimize therapy . Current regimen:  o Colchicine 0.6 mg as needed for gout flare . Interventions: o Discussed benefits of allopurinol for prevention of flares o Recommend allopurinol 50 mg daily . Patient self care activities - Over the next 90 days, patient will: o Take colchicine as directed for gout flares  Medication management . Pharmacist Clinical Goal(s): o Over the next 90 days, patient will work with PharmD and providers to maintain optimal medication adherence . Current pharmacy: [facility] mail order . Interventions o Comprehensive medication review performed. o Continue current medication management strategy . Patient self care activities - Over the next 90 days, patient will: o Focus on medication adherence by pill box o Take  medications as prescribed o Report any questions or concerns to PharmD and/or provider(s)  Initial goal documentation      Mr. Ahn was given information about Chronic Care Management services today including:  1. CCM service includes personalized support from designated clinical staff supervised by his  physician, including individualized plan of care and coordination with other care providers 2. 24/7 contact phone numbers for assistance for urgent and routine care needs. 3. Standard insurance, coinsurance, copays and deductibles apply for chronic care management only during months in which we provide at least 20 minutes of these services. Most insurances cover these services at 100%, however patients may be responsible for any copay, coinsurance and/or deductible if applicable. This service may help you avoid the need for more expensive face-to-face services. 4. Only one practitioner may furnish and bill the service in a calendar month. 5. The patient may stop CCM services at any time (effective at the end of the month) by phone call to the office staff.  Patient agreed to services and verbal consent obtained.   Patient verbalizes understanding of instructions provided today.  The pharmacy team will reach out to the patient again over the next 30 days.   Charlene Brooke, PharmD, BCACP Clinical Pharmacist [facility] A low-purine eating plan involves making food choices to limit your intake of purine. Purine is a kind of uric acid. Too much uric acid in your blood can cause certain conditions, such as gout and kidney stones. Eating a low-purine diet can help control these conditions. What are tips for following this plan? Reading food labels   Avoid foods with saturated or Trans fat.  Check the ingredient list of grains-based foods, such as bread and cereal, to make sure that they contain whole grains.  Check the ingredient list of sauces or soups to make sure they do not contain meat or fish.  When choosing soft drinks, check the ingredient list to make sure they do not contain high-fructose corn syrup. Shopping  Buy plenty of fresh fruits and vegetables.  Avoid buying canned or fresh fish.  Buy dairy products  labeled as low-fat or nonfat.  Avoid buying premade or processed foods. These foods are often high in fat, salt (sodium), and added sugar. Cooking  Use olive oil instead of butter when cooking. Oils like olive oil, canola oil, and sunflower oil contain healthy fats. Meal planning  Learn which foods do or do not affect you. If you find out that a food tends to cause your gout symptoms to flare up, avoid eating that food. You can enjoy foods that do not cause problems. If you have any questions about a food item, talk with your dietitian or health care provider.  Limit foods high in fat, especially saturated fat. Fat makes it harder for your body to get rid of uric acid.  Choose foods that are lower in fat and are lean sources of protein. General guidelines  Limit alcohol intake to no more than 1 drink a day for nonpregnant women and 2 drinks a day for men. One drink equals 12 oz of beer, 5 oz of wine, or 1 oz of hard liquor. Alcohol can affect the way your body gets rid of uric acid.  Drink plenty of water to keep your urine clear or pale yellow. Fluids can help remove uric acid from your body.  If directed by your health care provider, take a vitamin C supplement.  Work  with your health care provider and dietitian to develop a plan to achieve or maintain a healthy weight. Losing weight can help reduce uric acid in your blood. What foods are recommended? The items listed may not be a complete list. Talk with your dietitian about what dietary choices are best for you. Foods low in purines Foods low in purines do not need to be limited. These include:  All fruits.  All low-purine vegetables, pickles, and olives.  Breads, pasta, rice, cornbread, and popcorn. Cake and other baked goods.  All dairy foods.  Eggs, nuts, and nut butters.  Spices and condiments, such as salt, herbs, and vinegar.  Plant oils, butter, and margarine.  Water, sugar-free soft drinks, tea, coffee, and  cocoa.  Vegetable-based soups, broths, sauces, and gravies. Foods moderate in purines Foods moderate in purines should be limited to the amounts listed.   cup of asparagus, cauliflower, spinach, mushrooms, or green peas, each day.  2/3 cup uncooked oatmeal, each day.   cup dry wheat bran or wheat germ, each day.  2-3 ounces of meat or poultry, each day.  4-6 ounces of shellfish, such as crab, lobster, oysters, or shrimp, each day.  1 cup cooked beans, peas, or lentils, each day.  Soup, broths, or bouillon made from meat or fish. Limit these foods as much as possible. What foods are not recommended? The items listed may not be a complete list. Talk with your dietitian about what dietary choices are best for you. Limit your intake of foods high in purines, including:  Beer and other alcohol.  Meat-based gravy or sauce.  Canned or fresh fish, such as: ? Anchovies, sardines, herring, and tuna. ? Mussels and scallops. ? Codfish, trout, and haddock.  Berniece Salines.  Organ meats, such as: ? Liver or kidney. ? Tripe. ? Sweetbreads (thymus gland or pancreas).  Wild Clinical biochemist.  Yeast or yeast extract supplements.  Drinks sweetened with high-fructose corn syrup. Summary  Eating a low-purine diet can help control conditions caused by too much uric acid in the body, such as gout or kidney stones.  Choose low-purine foods, limit alcohol, and limit foods high in fat.  You will learn over time which foods do or do not affect you. If you find out that a food tends to cause your gout symptoms to flare up, avoid eating that food. This information is not intended to replace advice given to you by your health care provider. Make sure you discuss any questions you have with your health care provider. Document Revised: 04/13/2017 Document Reviewed: 06/14/2016 Elsevier Patient Education  2020 [employer].

## 2020-03-19 DIAGNOSIS — G4733 Obstructive sleep apnea (adult) (pediatric): Secondary | ICD-10-CM | POA: Diagnosis not present

## 2020-03-19 NOTE — Telephone Encounter (Signed)
Message left for patient

## 2020-03-23 ENCOUNTER — Other Ambulatory Visit: Payer: Self-pay

## 2020-03-23 DIAGNOSIS — I10 Essential (primary) hypertension: Secondary | ICD-10-CM

## 2020-03-24 ENCOUNTER — Other Ambulatory Visit: Payer: Self-pay | Admitting: Internal Medicine

## 2020-03-26 ENCOUNTER — Other Ambulatory Visit: Payer: Self-pay | Admitting: Endocrinology

## 2020-03-30 ENCOUNTER — Other Ambulatory Visit: Payer: Self-pay | Admitting: Internal Medicine

## 2020-03-30 ENCOUNTER — Ambulatory Visit: Payer: Medicare HMO | Admitting: Podiatry

## 2020-03-30 ENCOUNTER — Encounter: Payer: Self-pay | Admitting: Podiatry

## 2020-03-30 ENCOUNTER — Other Ambulatory Visit: Payer: Self-pay

## 2020-03-30 DIAGNOSIS — L84 Corns and callosities: Secondary | ICD-10-CM

## 2020-03-30 DIAGNOSIS — M2042 Other hammer toe(s) (acquired), left foot: Secondary | ICD-10-CM

## 2020-03-30 DIAGNOSIS — E0822 Diabetes mellitus due to underlying condition with diabetic chronic kidney disease: Secondary | ICD-10-CM | POA: Diagnosis not present

## 2020-03-30 DIAGNOSIS — M79675 Pain in left toe(s): Secondary | ICD-10-CM

## 2020-03-30 DIAGNOSIS — B351 Tinea unguium: Secondary | ICD-10-CM

## 2020-03-30 DIAGNOSIS — M2041 Other hammer toe(s) (acquired), right foot: Secondary | ICD-10-CM

## 2020-03-30 DIAGNOSIS — E119 Type 2 diabetes mellitus without complications: Secondary | ICD-10-CM | POA: Diagnosis not present

## 2020-03-30 DIAGNOSIS — M2011 Hallux valgus (acquired), right foot: Secondary | ICD-10-CM | POA: Diagnosis not present

## 2020-03-30 DIAGNOSIS — M79674 Pain in right toe(s): Secondary | ICD-10-CM

## 2020-03-30 DIAGNOSIS — M2012 Hallux valgus (acquired), left foot: Secondary | ICD-10-CM

## 2020-03-30 DIAGNOSIS — N1832 Chronic kidney disease, stage 3b: Secondary | ICD-10-CM

## 2020-03-30 NOTE — Progress Notes (Signed)
Subjective: Corey [name] presents today referred by [name], MD for diabetic foot evaluation.  Patient relates 30 year history of diabetes.  Patient denies any history of foot wounds.  Patient denies any history of numbness, tingling, burning, pins/needles sensations.  Today, patient c/o of painful, discolored, thick toenails which interfere with daily activities.  Pain is aggravated when wearing enclosed shoe gear.   He has secondary c/o painful left 2nd toe. Duration greater than one month. He states it's painful when wearing enclosed shoe gear.  He did not check his blood glucose this morning. States it was 85 mg/dl on yesterday.  Past Medical History:  Diagnosis Date  . Cataract   . Colon polyp 2003   Dr [name]; F/U was to be 2008( not completed)  . CVA (cerebral infarction) 2014  . Diabetes mellitus   . Diverticulosis 2003  . Dyspnea    with exertion  . Gout   . Hypertension   . Myocardial infarction ([place]) 2014  . OSA (obstructive sleep apnea) 03/27/2018  . Pneumonia 09/29/11   Avelox X 10 days as OP  . Prostate cancer ([place])   . Renal insufficiency   . Seizures ([place])    not treated for seizure disorder; had a seizure after stroke 2014; no seizure since then    Patient Active Problem List   Diagnosis Date Noted  . Acute respiratory disease due to COVID-19 virus 06/05/2019  . DOE (dyspnea on exertion) 03/01/2019  . Mild cognitive impairment 12/05/2018  . Slurred speech 07/23/2018  . Muscle twitching 07/23/2018  . OSA (obstructive sleep apnea) 03/27/2018  . Snores 01/21/2018  . Poor balance 01/23/2017  . Ankle swelling, left 09/06/2016  . Bradycardia 01/28/2016  . Weak urinary stream 12/31/2015  . Stage 3b chronic kidney disease 09/15/2015  . Ventral hernia without obstruction or gangrene 09/15/2015  . Prostate cancer ([place]) 07/25/2015  . Erectile dysfunction 05/04/2015  . Urinary frequency 05/04/2015  . 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 Abnormal finding on MRI of brain 02/14/2015  . PEA (Pulseless electrical activity) ([place]) 12/03/2012  . Diabetes ([place]) 06/30/2011  . Gout 03/05/2007  . Essential hypertension 03/05/2007    Past Surgical History:  Procedure Laterality Date  . COLONOSCOPY W/ POLYPECTOMY  2003   no F/U ([place] discussed 12/03/12)  . INSERTION OF MESH N/A 07/16/2017   Procedure: INSERTION OF MESH;  Surgeon: [name], MD;  Location: [facility];  Service: General;  Laterality: N/A;  . PEG PLACEMENT  2014  . PEG TUBE REMOVAL  2014  . PROSTATE BIOPSY    . TRACHEOSTOMY  2014   was closed after hospital stay  . VENTRAL HERNIA REPAIR N/A 07/16/2017   Procedure: LAPAROSCOPIC VENTRAL HERNIA REPAIR WITH MESH;  Surgeon: [name], MD;  Location: [facility];  Service: General;  Laterality: N/A;  . wrist aspiration  02/16/12    monosodium urate crystals; Dr Caralyn Guile    Current Outpatient Medications on File Prior to Visit  Medication Sig Dispense Refill  . allopurinol (ZYLOPRIM) 100 MG tablet Take 0.5 tablets (50 mg total) by mouth daily. 45 tablet 1  . amLODipine (NORVASC) 10 MG tablet Take 1 tablet (10 mg total) by mouth daily. 90 tablet 1  . aspirin EC 81 MG tablet Take 1 tablet (81 mg total) by mouth daily.    . cloNIDine (CATAPRES) 0.3 MG tablet Take 1 tablet (0.3 mg total) by mouth 2 (two) times daily. 180 tablet 1  .  colchicine (COLCRYS) 0.6 MG tablet Take 2 tabs once and then one tab one hour later as needed for gout 90 tablet 2  . furosemide (LASIX) 20 MG tablet Take 1 tablet by mouth once daily in the morning 90 tablet 0  . glimepiride (AMARYL) 1 MG tablet Take 0.5 tablets (0.5 mg total) by mouth at bedtime. (Patient taking differently: Take 1 mg by mouth at bedtime. ) 45 tablet 0  . hydrALAZINE (APRESOLINE) 50 MG tablet TAKE 1 AND 1/2 TABLETS THREE TIMES DAILY 405 tablet 3  . isosorbide mononitrate (IMDUR) 60 MG 24 hr tablet TAKE 1 TABLET TWICE DAILY (FOLLOW-UP APPOINTMENT IS DUE.  MUST SEE PROVIDER FOR FUTURE REFILLS) 180 tablet 1  . losartan (COZAAR) 100 MG tablet TAKE 1 TABLET EVERY DAY 90 tablet 1  . metFORMIN (GLUCOPHAGE-XR) 750 MG 24 hr tablet TAKE 1 TABLET TWICE DAILY (Patient taking differently: Take 750 mg by mouth daily with breakfast. ) 180 tablet 0   No current facility-administered medications on file prior to visit.     Allergies  Allergen Reactions  . Hydrochlorothiazide Other (See Comments)    Gout , uncontrolled diabetes and renal insufficiency    Social History   Occupational History  . Not on file  Tobacco Use  . Smoking status: Former Smoker    Packs/day: 0.25    Years: 1.00    Pack years: 0.25    Types: Cigarettes    Quit date: 05/15/1968    Years since quitting: 51.9  . Smokeless tobacco: Never Used  . Tobacco comment: smoked [place], up to 1-2 cigarettes/ day  Vaping Use  . Vaping Use: Never used  Substance and Sexual Activity  . Alcohol use: No    Alcohol/week: 0.0 standard drinks  . Drug use: No  . Sexual activity: Yes    Family History  Problem Relation Age of Onset  . Hypertension Father   . Prostate cancer Father   . Diabetes Father   . Cancer Father   . Hypertension Mother   . Diabetes Mother   . Stroke Neg Hx   . Heart disease Neg Hx   . Colon cancer Neg Hx     Immunization History  Administered Date(s) Administered  . Fluad Quad(high Dose 65+) 03/05/2019, 01/28/2020  . Influenza Split 03/02/2011  . Influenza Whole 01/23/2013  . Influenza, High Dose Seasonal PF 01/28/2016, 01/23/2017, 01/21/2018  . Influenza,inj,Quad PF,6+ Mos 02/23/2014, 02/24/2015  . PFIZER SARS-COV-2 Vaccination 07/08/2019  . Pneumococcal Conjugate-13 05/04/2015  . Pneumococcal Polysaccharide-23 10/02/2012, 01/21/2018  . Tdap 12/15/2010, 09/21/2011    Objective: There were no vitals filed for this visit.  Corey [name] is a pleasant 74 y.o. male in NAD. AAO X 3.  Vascular Examination: Capillary fill time to digits <3 seconds  b/l lower extremities. Faintly palpable pedal pulses b/l. Pedal hair present. Lower extremity skin temperature gradient within normal limits.  Dermatological Examination: Pedal skin with normal turgor, texture and tone bilaterally. No open wounds bilaterally. No interdigital macerations bilaterally. Toenails 1-5 b/l elongated, discolored, dystrophic, thickened, crumbly with subungual debris and tenderness to dorsal palpation. Hyperkeratotic lesion(s) L 2nd toe, preulcerative and measures 1/0 x 1.0 cm predebridement.  No erythema, no edema, no drainage, no fluctuance.  Musculoskeletal Examination: Normal muscle strength 5/5 to all lower extremity muscle groups bilaterally. Hammertoes noted to the 2-5 bilaterally.  Footwear Assessment: Does the patient wear appropriate shoes? No. Does the patient need inserts/orthotics?  No.  Neurological Examination: Protective sensation decreased with 10 gram  monofilament b/l. Vibratory sensation decreased b/l.  Hemoglobin A1C Latest Ref Rng & Units 03/01/2020 11/26/2019 08/01/2019 04/30/2019  HGBA1C 4.6 - 6.5 % 6.7(H) 7.9(H) 8.1(H) 7.5(H)  Some recent data might be hidden   Assessment: 1. Pain due to onychomycosis of toenails of both feet   2. Corns   3. Acquired hammertoes of both feet   4. Hallux valgus, acquired, bilateral   5. Diabetes mellitus due to underlying condition with stage 3b chronic kidney disease, without long-term current use of insulin ([place])   6. Encounter for diabetic foot exam ([place])      ADA Risk Categorization:  Low Risk:  Patient has all of the following: Intact protective sensation No prior foot ulcer  No severe deformity Pedal pulses present  Plan: -Examined patient. -Diabetic foot examination performed on today's visit. -Patient to continue soft, supportive shoe gear daily. -Toenails 1-5 b/l were debrided in length and girth with sterile nail nippers and dremel without iatrogenic bleeding.  -Corn(s) L 2nd toe pared  utilizing sterile scalpel blade without complication or incident. Total number debrided=1. -Patient to report any pedal injuries to medical professional immediately. -Patient/POA to call should there be question/concern in the interim.  Return in about 1 month (around 04/29/2020).

## 2020-03-30 NOTE — Patient Instructions (Signed)
APPLY NEOSPORIN CREAM TO LEFT 2ND TOE ONCE DAILY.     Diabetes Mellitus and Foot Care Foot care is an important part of your health, especially when you have diabetes. Diabetes may cause you to have problems because of poor blood flow (circulation) to your feet and legs, which can cause your skin to:  Become thinner and drier.  Break more easily.  Heal more slowly.  Peel and crack. You may also have nerve damage (neuropathy) in your legs and feet, causing decreased feeling in them. This means that you may not notice minor injuries to your feet that could lead to more serious problems. Noticing and addressing any potential problems early is the best way to prevent future foot problems. How to care for your feet Foot hygiene  Wash your feet daily with warm water and mild soap. Do not use hot water. Then, pat your feet and the areas between your toes until they are completely dry. Do not soak your feet as this can dry your skin.  Trim your toenails straight across. Do not dig under them or around the cuticle. File the edges of your nails with an emery board or nail file.  Apply a moisturizing lotion or petroleum jelly to the skin on your feet and to dry, brittle toenails. Use lotion that does not contain alcohol and is unscented. Do not apply lotion between your toes. Shoes and socks  Wear clean socks or stockings every day. Make sure they are not too tight. Do not wear knee-high stockings since they may decrease blood flow to your legs.  Wear shoes that fit properly and have enough cushioning. Always look in your shoes before you put them on to be sure there are no objects inside.  To break in new shoes, wear them for just a few hours a day. This prevents injuries on your feet. Wounds, scrapes, corns, and calluses  Check your feet daily for blisters, cuts, bruises, sores, and redness. If you cannot see the bottom of your feet, use a mirror or ask someone for help.  Do not cut corns or  calluses or try to remove them with medicine.  If you find a minor scrape, cut, or break in the skin on your feet, keep it and the skin around it clean and dry. You may clean these areas with mild soap and water. Do not clean the area with peroxide, alcohol, or iodine.  If you have a wound, scrape, corn, or callus on your foot, look at it several times a day to make sure it is healing and not infected. Check for: ? Redness, swelling, or pain. ? Fluid or blood. ? Warmth. ? Pus or a bad smell. General instructions  Do not cross your legs. This may decrease blood flow to your feet.  Do not use heating pads or hot water bottles on your feet. They may burn your skin. If you have lost feeling in your feet or legs, you may not know this is happening until it is too late.  Protect your feet from hot and cold by wearing shoes, such as at the beach or on hot pavement.  Schedule a complete foot exam at least once a year (annually) or more often if you have foot problems. If you have foot problems, report any cuts, sores, or bruises to your health care provider immediately. Contact a health care provider if:  You have a medical condition that increases your risk of infection and you have any cuts, sores,  or bruises on your feet.  You have an injury that is not healing.  You have redness on your legs or feet.  You feel burning or tingling in your legs or feet.  You have pain or cramps in your legs and feet.  Your legs or feet are numb.  Your feet always feel cold.  You have pain around a toenail. Get help right away if:  You have a wound, scrape, corn, or callus on your foot and: ? You have pain, swelling, or redness that gets worse. ? You have fluid or blood coming from the wound, scrape, corn, or callus. ? Your wound, scrape, corn, or callus feels warm to the touch. ? You have pus or a bad smell coming from the wound, scrape, corn, or callus. ? You have a fever. ? You have a red line  going up your leg. Summary  Check your feet every day for cuts, sores, red spots, swelling, and blisters.  Moisturize feet and legs daily.  Wear shoes that fit properly and have enough cushioning.  If you have foot problems, report any cuts, sores, or bruises to your health care provider immediately.  Schedule a complete foot exam at least once a year (annually) or more often if you have foot problems. This information is not intended to replace advice given to you by your health care provider. Make sure you discuss any questions you have with your health care provider. Document Revised: 01/22/2019 Document Reviewed: 06/02/2016 Elsevier Patient Education  [employer].

## 2020-04-01 ENCOUNTER — Telehealth: Payer: Self-pay | Admitting: Endocrinology

## 2020-04-01 ENCOUNTER — Other Ambulatory Visit: Payer: Self-pay | Admitting: *Deleted

## 2020-04-01 NOTE — Telephone Encounter (Signed)
Patient called to request a refill for Pioglitazone HCl 15 MG   Patient is requesting a call back as well because of how swollen his feet are becoming.  Call back number [phone]  Pharmacy is [facility] ([place]),  - [place]

## 2020-04-01 NOTE — Telephone Encounter (Signed)
I do not see this in his med list. Okay to send?

## 2020-04-01 NOTE — Telephone Encounter (Signed)
His feet may be swelling from the pioglitazone.  He is not supposed to be taking this.  He needs to stop this and take an extra fluid pill for a couple of days

## 2020-04-07 ENCOUNTER — Other Ambulatory Visit (INDEPENDENT_AMBULATORY_CARE_PROVIDER_SITE_OTHER): Payer: Medicare HMO

## 2020-04-07 ENCOUNTER — Other Ambulatory Visit: Payer: Self-pay

## 2020-04-07 DIAGNOSIS — N289 Disorder of kidney and ureter, unspecified: Secondary | ICD-10-CM | POA: Diagnosis not present

## 2020-04-07 LAB — COMPREHENSIVE METABOLIC PANEL
ALT: 21 U/L (ref 0–53)
AST: 29 U/L (ref 0–37)
Albumin: 3.6 g/dL (ref 3.5–5.2)
Alkaline Phosphatase: 61 U/L (ref 39–117)
BUN: 26 mg/dL — ABNORMAL HIGH (ref 6–23)
CO2: 26 mEq/L (ref 19–32)
Calcium: 9.8 mg/dL (ref 8.4–10.5)
Chloride: 113 mEq/L — ABNORMAL HIGH (ref 96–112)
Creatinine, Ser: 1.65 mg/dL — ABNORMAL HIGH (ref 0.40–1.50)
GFR: 41.39 mL/min — ABNORMAL LOW (ref >60.00)
Glucose, Bld: 109 mg/dL — ABNORMAL HIGH (ref 70–99)
Potassium: 4.1 mEq/L (ref 3.5–5.1)
Sodium: 144 mEq/L (ref 135–145)
Total Bilirubin: 0.6 mg/dL (ref 0.2–1.2)
Total Protein: 5.9 g/dL — ABNORMAL LOW (ref 6.0–8.3)

## 2020-04-09 ENCOUNTER — Other Ambulatory Visit: Payer: Self-pay | Admitting: Cardiology

## 2020-04-14 ENCOUNTER — Other Ambulatory Visit: Payer: Self-pay | Admitting: Internal Medicine

## 2020-04-14 ENCOUNTER — Other Ambulatory Visit: Payer: Self-pay | Admitting: Endocrinology

## 2020-04-14 ENCOUNTER — Telehealth: Payer: Self-pay | Admitting: Endocrinology

## 2020-04-14 ENCOUNTER — Encounter: Payer: Self-pay | Admitting: Endocrinology

## 2020-04-14 ENCOUNTER — Other Ambulatory Visit: Payer: Self-pay

## 2020-04-14 ENCOUNTER — Ambulatory Visit (INDEPENDENT_AMBULATORY_CARE_PROVIDER_SITE_OTHER): Payer: Medicare HMO | Admitting: Endocrinology

## 2020-04-14 VITALS — BP 105/65 | HR 51 | Ht 66.0 in | Wt 203.0 lb

## 2020-04-14 DIAGNOSIS — I10 Essential (primary) hypertension: Secondary | ICD-10-CM | POA: Diagnosis not present

## 2020-04-14 DIAGNOSIS — E1165 Type 2 diabetes mellitus with hyperglycemia: Secondary | ICD-10-CM

## 2020-04-14 DIAGNOSIS — N289 Disorder of kidney and ureter, unspecified: Secondary | ICD-10-CM | POA: Diagnosis not present

## 2020-04-14 DIAGNOSIS — E119 Type 2 diabetes mellitus without complications: Secondary | ICD-10-CM

## 2020-04-14 MED ORDER — METFORMIN HCL 500 MG PO TABS: 500.0000 mg | ORAL_TABLET | Freq: Two times a day (BID) | ORAL | 3 refills | Status: DC

## 2020-04-14 NOTE — Telephone Encounter (Signed)
Please see below and advise, I did not see anything on his avs from today

## 2020-04-14 NOTE — Patient Instructions (Addendum)
Reduce AMLODIPINE IN 1/2 if 10mg , if taking 1/2 tablet currently then stop this completely  STAY on 1/2 Losartan tablet daily  Metformin 750 mg to be changed to 500 mg twice a day, please pick up new prescription

## 2020-04-14 NOTE — Telephone Encounter (Signed)
Noted,  There was no answer when I tried calling him back,

## 2020-04-14 NOTE — Telephone Encounter (Signed)
AVS had all the info needed, pl. recheck

## 2020-04-14 NOTE — Telephone Encounter (Signed)
Pt calling for clarification of what changes were made at today's visit. Pt states he did not see changes noted on AVS. Please advise.

## 2020-04-14 NOTE — Progress Notes (Signed)
Patient ID: Corey [name], male   DOB: [birth year], 74 y.o.   MRN: [id number]   Reason for Appointment : Followup of various problems  History of Present Illness          Diagnosis: Type 2 diabetes mellitus, date of diagnosis: 1997      Past history: The patient was initially treated with various oral hypoglycemic drugs and details are not available. About 2009 he was started on insulin and has either taken premixed insulin or Lantus once a day He had difficulty affording his Lantus insulin and had preferred to take premixed insulin Subsequently insulin was tapered off  Recent history:    Oral hypoglycemic drugs: Metformin ER 750 mg, half tablet twice daily, Amaryl 0.5 mg at bedtime  Current blood sugar patterns and problems:  His A1c is last improved back at 6.7 compared to 7.9  Previous fructosamine was 320    His blood sugars again are well controlled as seen on his home monitoring  RANGE 72-115 recently with average 95, previous range was similar  This is despite reducing his Metformin 50% because of renal dysfunction on his visit in October  He does not like the large tablets of Metformin and his wife is giving him half tablet twice a day of the Metformin ER  Morning blood sugars are slightly higher than the afternoon or late evening but no hypoglycemia with Amaryl   Glucose monitoring:  done less than 1 time a day         Glucometer:  Accu-Chek       Blood Glucose readings from download: As above  Previous range 85-129   Self-care:       Meals: 2-3 meals per day. Egg in am at 9-10 am or grits, rarely sandwich at lunch. His largest meal is at suppertime at about 8 pm.   Physical activity: exercise: Walking up to 30 minutes; 3/7  days        Dietician visit: Most recent: 02/2014               Compliance with the medical regimen:  Fair.    Wt Readings from Last 3 Encounters:  04/14/20 203 lb (92.1 kg)  03/03/20 187 lb 12.8 oz (85.2 kg)  03/01/20 188  lb (85.3 kg)    Lab Results  Component Value Date   HGBA1C 6.7 (H) 03/01/2020   HGBA1C 7.9 (H) 11/26/2019   HGBA1C 8.1 (H) 08/01/2019    Lab Results  Component Value Date   MICROALBUR 2.4 (H) 03/03/2020   LDLCALC 61 01/28/2020   CREATININE 1.65 (H) 04/07/2020   No visits with results within 1 Week(s) from this visit.  Latest known visit with results is:  Lab on 04/07/2020  Component Date Value Ref Range Status  . Sodium 04/07/2020 144  135 - 145 mEq/L Final  . Potassium 04/07/2020 4.1  3.5 - 5.1 mEq/L Final  . Chloride 04/07/2020 113* 96 - 112 mEq/L Final  . CO2 04/07/2020 26  19 - 32 mEq/L Final  . Glucose, Bld 04/07/2020 109* 70 - 99 mg/dL Final  . BUN 04/07/2020 26* 6 - 23 mg/dL Final  . Creatinine, Ser 04/07/2020 1.65* 0.40 - 1.50 mg/dL Final  . Total Bilirubin 04/07/2020 0.6  0.2 - 1.2 mg/dL Final  . Alkaline Phosphatase 04/07/2020 61  39 - 117 U/L Final  . AST 04/07/2020 29  0 - 37 U/L Final  . ALT 04/07/2020 21  0 - 53 U/L Final  .  Total Protein 04/07/2020 5.9* 6.0 - 8.3 g/dL Final  . Albumin 04/07/2020 3.6  3.5 - 5.2 g/dL Final  . GFR 04/07/2020 41.39* >60.00 mL/min Final   Calculated using the CKD-EPI Creatinine Equation (2021)  . Calcium 04/07/2020 9.8  8.4 - 10.5 mg/dL Final   Other active medical problems addressed: See review of systems   Allergies as of 04/14/2020      Reactions   Hydrochlorothiazide Other (See Comments)   Gout , uncontrolled diabetes and renal insufficiency      Medication List       Accurate as of April 14, 2020 10:39 AM. If you have any questions, ask your nurse or doctor.        allopurinol 100 MG tablet Commonly known as: ZYLOPRIM Take 0.5 tablets (50 mg total) by mouth daily.   amLODipine 10 MG tablet Commonly known as: NORVASC Take 1 tablet (10 mg total) by mouth daily.   aspirin EC 81 MG tablet Take 1 tablet (81 mg total) by mouth daily.   cloNIDine 0.3 MG tablet Commonly known as: CATAPRES Take 1 tablet (0.3 mg  total) by mouth 2 (two) times daily.   colchicine 0.6 MG tablet Commonly known as: Colcrys Take 2 tabs once and then one tab one hour later as needed for gout   furosemide 20 MG tablet Commonly known as: LASIX Take 1 tablet by mouth once daily in the morning   glimepiride 1 MG tablet Commonly known as: AMARYL Take 0.5 tablets (0.5 mg total) by mouth at bedtime. What changed: how much to take   hydrALAZINE 50 MG tablet Commonly known as: APRESOLINE TAKE 1 AND 1/2 TABLETS THREE TIMES DAILY   isosorbide mononitrate 60 MG 24 hr tablet Commonly known as: IMDUR TAKE 1 TABLET TWICE DAILY (FOLLOW-UP APPOINTMENT IS DUE. MUST SEE PROVIDER FOR FUTURE REFILLS)   losartan 100 MG tablet Commonly known as: COZAAR TAKE 1 TABLET EVERY DAY   metFORMIN 750 MG 24 hr tablet Commonly known as: GLUCOPHAGE-XR TAKE 1 TABLET TWICE DAILY What changed: when to take this       Allergies:  Allergies  Allergen Reactions  . Hydrochlorothiazide Other (See Comments)    Gout , uncontrolled diabetes and renal insufficiency    Past Medical History:  Diagnosis Date  . Cataract   . Colon polyp 2003   Dr [name]; F/U was to be 2008( not completed)  . CVA (cerebral infarction) 2014  . Diabetes mellitus   . Diverticulosis 2003  . Dyspnea    with exertion  . Gout   . Hypertension   . Myocardial infarction ([place]) 2014  . OSA (obstructive sleep apnea) 03/27/2018  . Pneumonia 09/29/11   Avelox X 10 days as OP  . Prostate cancer ([place])   . Renal insufficiency   . Seizures ([place])    not treated for seizure disorder; had a seizure after stroke 2014; no seizure since then    Past Surgical History:  Procedure Laterality Date  . COLONOSCOPY W/ POLYPECTOMY  2003   no F/U ([place] discussed 12/03/12)  . INSERTION OF MESH N/A 07/16/2017   Procedure: INSERTION OF MESH;  Surgeon: [name], MD;  Location: [facility];  Service: General;  Laterality: N/A;  . PEG PLACEMENT  2014  . PEG TUBE REMOVAL  2014  .  PROSTATE BIOPSY    . TRACHEOSTOMY  2014   was closed after hospital stay  . VENTRAL HERNIA REPAIR N/A 07/16/2017   Procedure: LAPAROSCOPIC VENTRAL HERNIA REPAIR  WITH MESH;  Surgeon: [name], MD;  Location: [facility];  Service: General;  Laterality: N/A;  . wrist aspiration  02/16/12    monosodium urate crystals; Dr Caralyn Guile    Family History  Problem Relation Age of Onset  . Hypertension Father   . Prostate cancer Father   . Diabetes Father   . Cancer Father   . Hypertension Mother   . Diabetes Mother   . Stroke Neg Hx   . Heart disease Neg Hx   . Colon cancer Neg Hx     Social History:  reports that he quit smoking about 51 years ago. His smoking use included cigarettes. He has a 0.25 pack-year smoking history. He has never used smokeless tobacco. He reports that he does not drink alcohol and does not use drugs.    Review of Systems   Lipids: Has normal lipids, not on a statin drug  Lab Results  Component Value Date   CHOL 125 01/28/2020   HDL 50 01/28/2020   LDLCALC 61 01/28/2020   TRIG 55 01/28/2020   CHOLHDL 2.5 01/28/2020    Eye exams: Last done in 1/20 without retinopathy     He has renal insufficiency of unclear etiology with variable creatinine levels as follows: With higher creatinine in 10/21 his losartan was reduced to half tablet  Lab Results  Component Value Date   CREATININE 1.65 (H) 04/07/2020   CREATININE 1.83 (H) 03/01/2020   CREATININE 1.69 (H) 01/28/2020     HYPERTENSION: Currently on losartan 50 mg, amlodipine , clonidine 0.3 mg and hydralazine as well as metoprolol Currently managed by PCP and cardiologist  He also takes Lasix for edema  Not checking at home even though he has a meter  He was told to cut his losartan to half tablet in October because of low normal blood pressure and rising creatinine  BP Readings from Last 3 Encounters:  04/14/20 122/64  03/03/20 100/62  03/01/20 140/80    Foot exam done in 3/20 with  PCP   Physical Examination:  BP 122/64   Pulse (!) 51   Ht 5\' 6"  (1.676 m)   Wt 203 lb (92.1 kg)   SpO2 97%   BMI 32.77 kg/m   Standing blood pressure 105/65 2+ ankle edema present   ASSESSMENT/PLAN:  Diabetes type 2, uncontrolled   See history of present illness for discussion of current diabetes management, blood sugar patterns and problems identified  Last A1c was 6.7  He is on metformin 750 mg daily and 0.5 mg Amaryl at bedtime He has excellent blood sugars at home at different times even with reducing his Metformin  His weight changes likely related to the retention  Since his renal function is still not significantly better he can continue lower doses of Metformin For simplicity and ease of swallowing he will take 500 mg twice daily instead of cutting the extended release tablets in half which he is doing now New prescription sent  HYPERTENSION: He is still somewhat orthostatic and blood pressure is relatively low standing up even with reducing his losartan  As before he is on several high dose medications, normally managed by his PCP but does not have a visit upcoming  Currently since he is having some edema he will stop his amlodipine, presumably he is taking only half of the 10 mg tablet  Renal dysfunction: Creatinine is slightly better with reducing his losartan However he needs to talk to his PCP regarding blood pressure management, medication  adjustment, possible nephrology consultation and adjustment of diuretics  Follow-up in 3 months  There are no Patient Instructions on file for this visit.   Elayne Snare 04/14/2020, 10:39 AM

## 2020-04-15 NOTE — Telephone Encounter (Signed)
Please try reaching out to his wife again

## 2020-04-16 NOTE — Telephone Encounter (Signed)
Patients wife was asking about which medication he was suppose to stop taking, looking at his AVS it says to stop taking amlodipine, wife voiced understanding.

## 2020-04-16 NOTE — Telephone Encounter (Signed)
She was also supposed to confirm that he was taking only half of the 10 mg tablet of amlodipine

## 2020-04-16 NOTE — Telephone Encounter (Signed)
I told her that and she said yes he was only taking 1/2 a tablet.

## 2020-04-21 ENCOUNTER — Other Ambulatory Visit: Payer: Self-pay

## 2020-04-21 NOTE — Patient Outreach (Signed)
[facility]) Care Management  04/21/2020  [place] March 02, 1946 [id number]   Telephone Assessment    Outreach attempt #1 to patient. No answer. RN CM left HIPAA compliant voicemail message along with contact info.      Plan: RN CM will make quarterly outreach attempt to patient within the month of Feb  if no return call from patient.  Enzo Montgomery, RN,BSN,CCM [facility] Telephonic Care Management Coordinator Direct Phone: [phone] Toll Free: [phone] Fax: [phone]

## 2020-04-22 ENCOUNTER — Ambulatory Visit: Payer: Self-pay

## 2020-04-27 ENCOUNTER — Ambulatory Visit: Payer: Medicare HMO | Admitting: Podiatry

## 2020-04-27 ENCOUNTER — Encounter: Payer: Self-pay | Admitting: Podiatry

## 2020-04-27 ENCOUNTER — Other Ambulatory Visit: Payer: Self-pay

## 2020-04-27 DIAGNOSIS — L84 Corns and callosities: Secondary | ICD-10-CM | POA: Diagnosis not present

## 2020-04-27 DIAGNOSIS — E0822 Diabetes mellitus due to underlying condition with diabetic chronic kidney disease: Secondary | ICD-10-CM

## 2020-04-27 DIAGNOSIS — M2041 Other hammer toe(s) (acquired), right foot: Secondary | ICD-10-CM

## 2020-04-27 DIAGNOSIS — N1832 Chronic kidney disease, stage 3b: Secondary | ICD-10-CM | POA: Diagnosis not present

## 2020-04-27 DIAGNOSIS — M79675 Pain in left toe(s): Secondary | ICD-10-CM | POA: Diagnosis not present

## 2020-04-27 DIAGNOSIS — M79674 Pain in right toe(s): Secondary | ICD-10-CM

## 2020-04-27 DIAGNOSIS — B351 Tinea unguium: Secondary | ICD-10-CM

## 2020-04-27 DIAGNOSIS — M2042 Other hammer toe(s) (acquired), left foot: Secondary | ICD-10-CM

## 2020-04-29 ENCOUNTER — Other Ambulatory Visit: Payer: Medicare HMO

## 2020-05-02 NOTE — Progress Notes (Signed)
Subjective:  Patient ID: Corey [name], male    DOB: 1945/10/12,  MRN: [id number]  74 y.o. male presents with at risk foot care. Pt has h/o NIDDM with chronic kidney disease and corn(s) left 2nd toe and painful thick toenails that are difficult to trim. Painful toenails interfere with ambulation. Aggravating factors include wearing enclosed shoe gear. Pain is relieved with periodic professional debridement. Painful corns are aggravated when weightbearing when wearing enclosed shoe gear. Pain is relieved with periodic professional debridement..    Patient's blood sugar was 107 mg/dl this morning.  PCP: [name], MD and last visit was: 01/28/2020. He also sees Dr. Elayne Snare for his diabetes and last visit was 04/14/2020.  Review of Systems: Negative except as noted in the HPI.  Past Medical History:  Diagnosis Date  . Cataract   . Colon polyp 2003   Dr [name]; F/U was to be 2008( not completed)  . CVA (cerebral infarction) 2014  . Diabetes mellitus   . Diverticulosis 2003  . Dyspnea    with exertion  . Gout   . Hypertension   . Myocardial infarction ([place]) 2014  . OSA (obstructive sleep apnea) 03/27/2018  . Pneumonia 09/29/11   Avelox X 10 days as OP  . Prostate cancer ([place])   . Renal insufficiency   . Seizures ([place])    not treated for seizure disorder; had a seizure after stroke 2014; no seizure since then   Past Surgical History:  Procedure Laterality Date  . COLONOSCOPY W/ POLYPECTOMY  2003   no F/U ([place] discussed 12/03/12)  . INSERTION OF MESH N/A 07/16/2017   Procedure: INSERTION OF MESH;  Surgeon: [name], MD;  Location: [facility];  Service: General;  Laterality: N/A;  . PEG PLACEMENT  2014  . PEG TUBE REMOVAL  2014  . PROSTATE BIOPSY    . TRACHEOSTOMY  2014   was closed after hospital stay  . VENTRAL HERNIA REPAIR N/A 07/16/2017   Procedure: LAPAROSCOPIC VENTRAL HERNIA REPAIR WITH MESH;  Surgeon: [name], MD;  Location: [facility];  Service: General;   Laterality: N/A;  . wrist aspiration  02/16/12    monosodium urate crystals; Dr Caralyn Guile   Patient Active Problem List   Diagnosis Date Noted  . Acute respiratory disease due to COVID-19 virus 06/05/2019  . DOE (dyspnea on exertion) 03/01/2019  . Mild cognitive impairment 12/05/2018  . Slurred speech 07/23/2018  . Muscle twitching 07/23/2018  . OSA (obstructive sleep apnea) 03/27/2018  . Snores 01/21/2018  . Poor balance 01/23/2017  . Ankle swelling, left 09/06/2016  . Bradycardia 01/28/2016  . Weak urinary stream 12/31/2015  . Stage 3b chronic kidney disease 09/15/2015  . Ventral hernia without obstruction or gangrene 09/15/2015  . Prostate cancer ([place]) 07/25/2015  . Erectile dysfunction 05/04/2015  . Urinary frequency 05/04/2015  . 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 Abnormal finding on MRI of brain 02/14/2015  . PEA (Pulseless electrical activity) ([place]) 12/03/2012  . Diabetes ([place]) 06/30/2011  . Gout 03/05/2007  . Essential hypertension 03/05/2007    Current Outpatient Medications:  .  allopurinol (ZYLOPRIM) 100 MG tablet, Take 0.5 tablets (50 mg total) by mouth daily., Disp: 45 tablet, Rfl: 1 .  amLODipine (NORVASC) 10 MG tablet, Take 1 tablet (10 mg total) by mouth daily., Disp: 90 tablet, Rfl: 1 .  aspirin EC 81 MG tablet, Take 1 tablet (81 mg total) by mouth daily., Disp: , Rfl:  .  cloNIDine (  CATAPRES) 0.3 MG tablet, TAKE 1 TABLET TWICE DAILY, Disp: 180 tablet, Rfl: 1 .  colchicine (COLCRYS) 0.6 MG tablet, Take 2 tabs once and then one tab one hour later as needed for gout, Disp: 90 tablet, Rfl: 2 .  furosemide (LASIX) 20 MG tablet, Take 1 tablet by mouth once daily in the morning, Disp: 90 tablet, Rfl: 0 .  glimepiride (AMARYL) 1 MG tablet, Take 0.5 tablets (0.5 mg total) by mouth at bedtime. (Patient taking differently: Take 1 mg by mouth at bedtime. ), Disp: 45 tablet, Rfl: 0 .  hydrALAZINE (APRESOLINE) 50 MG tablet, TAKE 1 AND 1/2 TABLETS  THREE TIMES DAILY, Disp: 405 tablet, Rfl: 3 .  isosorbide mononitrate (IMDUR) 60 MG 24 hr tablet, TAKE 1 TABLET TWICE DAILY (FOLLOW-UP APPOINTMENT IS DUE. MUST SEE PROVIDER FOR FUTURE REFILLS), Disp: 180 tablet, Rfl: 1 .  losartan (COZAAR) 100 MG tablet, TAKE 1 TABLET EVERY DAY, Disp: 90 tablet, Rfl: 1 .  metFORMIN (GLUCOPHAGE) 500 MG tablet, Take 1 tablet (500 mg total) by mouth 2 (two) times daily with a meal., Disp: 180 tablet, Rfl: 3 .  TRUE METRIX BLOOD GLUCOSE TEST test strip, TEST BLOOD SUGAR FOUR TIMES DAILY, Disp: 350 strip, Rfl: 3 Allergies  Allergen Reactions  . Hydrochlorothiazide Other (See Comments)    Gout , uncontrolled diabetes and renal insufficiency   Social History   Tobacco Use  Smoking Status Former Smoker  . Packs/day: 0.25  . Years: 1.00  . Pack years: 0.25  . Types: Cigarettes  . Quit date: 05/15/1968  . Years since quitting: 52.0  Smokeless Tobacco Never Used  Tobacco Comment   smoked [place], up to 1-2 cigarettes/ day    Objective:  There were no vitals filed for this visit. Constitutional Patient is a pleasant 74 y.o. African American male in NAD. AAO x 3.  Vascular Capillary fill time to digits <3 seconds b/l lower extremities. Faintly palpable pedal pulses b/l. Pedal hair present. Lower extremity skin temperature gradient within normal limits. No cyanosis or clubbing noted.  Neurologic Normal speech. Protective sensation decreased with 10 gram monofilament b/l. Vibratory sensation decreased b/l.  Dermatologic Pedal skin with normal turgor, texture and tone bilaterally. No open wounds bilaterally. No interdigital macerations bilaterally. Toenails 1-5 b/l elongated, discolored, dystrophic, thickened, crumbly with subungual debris and tenderness to dorsal palpation. Hyperkeratotic lesion(s) improved L 2nd toe.  No erythema, no edema, no drainage, no fluctuance.  Orthopedic: Normal muscle strength 5/5 to all lower extremity muscle groups bilaterally. No pain  crepitus or joint limitation noted with ROM b/l. Hammertoes noted to the 2-5 bilaterally.   Hemoglobin A1C Latest Ref Rng & Units 03/01/2020 11/26/2019 08/01/2019  HGBA1C 4.6 - 6.5 % 6.7(H) 7.9(H) 8.1(H)  Some recent data might be hidden   Assessment:   1. Pain due to onychomycosis of toenails of both feet   2. Corns   3. Acquired hammertoes of both feet   4. Diabetes mellitus due to underlying condition with stage 3b chronic kidney disease, without long-term current use of insulin ([place])    Plan:  Patient was evaluated and treated and all questions answered.  Onychomycosis with pain -Nails palliatively debridement as below. -Educated on self-care  Procedure: Nail Debridement Rationale: Pain Type of Debridement: manual, sharp debridement. Instrumentation: Nail nipper, rotary burr. Number of Nails: 10  -Examined patient. -Continue diabetic foot care principles. -Patient to continue soft, supportive shoe gear daily. -Toenails 1-5 b/l were debrided in length and girth with sterile nail nippers  and dremel without iatrogenic bleeding.  -Corn(s) L 2nd toe pared utilizing sterile scalpel blade without complication or incident. Total number debrided=1. -Patient to report any pedal injuries to medical professional immediately. -Dispensed two silicone toe caps for b/l 2nd digit protection. Apply every morning. Remove every evening. -Patient/POA to call should there be question/concern in the interim.  Return in about 9 weeks (around 06/29/2020) for diabetic toenails, corn(s)/callus(es).  Marzetta Board, DPM

## 2020-05-06 ENCOUNTER — Ambulatory Visit (HOSPITAL_COMMUNITY)
Admission: EM | Admit: 2020-05-06 | Discharge: 2020-05-06 | Payer: Medicare HMO | Attending: Student | Admitting: Student

## 2020-05-06 ENCOUNTER — Emergency Department (HOSPITAL_COMMUNITY)
Admission: EM | Admit: 2020-05-06 | Discharge: 2020-05-06 | Disposition: A | Discharge status: Home / Self Care | Payer: Medicare HMO | Attending: Emergency Medicine | Admitting: Emergency Medicine

## 2020-05-06 ENCOUNTER — Other Ambulatory Visit: Payer: Self-pay

## 2020-05-06 ENCOUNTER — Encounter (HOSPITAL_COMMUNITY): Payer: Self-pay

## 2020-05-06 ENCOUNTER — Other Ambulatory Visit: Payer: Self-pay | Admitting: Family Medicine

## 2020-05-06 ENCOUNTER — Emergency Department (HOSPITAL_COMMUNITY): Payer: Medicare HMO

## 2020-05-06 DIAGNOSIS — I10 Essential (primary) hypertension: Secondary | ICD-10-CM | POA: Diagnosis not present

## 2020-05-06 DIAGNOSIS — I110 Hypertensive heart disease with heart failure: Secondary | ICD-10-CM | POA: Insufficient documentation

## 2020-05-06 DIAGNOSIS — R0602 Shortness of breath: Secondary | ICD-10-CM

## 2020-05-06 DIAGNOSIS — Z87891 Personal history of nicotine dependence: Secondary | ICD-10-CM | POA: Diagnosis not present

## 2020-05-06 DIAGNOSIS — E119 Type 2 diabetes mellitus without complications: Secondary | ICD-10-CM | POA: Insufficient documentation

## 2020-05-06 DIAGNOSIS — Z7984 Long term (current) use of oral hypoglycemic drugs: Secondary | ICD-10-CM | POA: Insufficient documentation

## 2020-05-06 DIAGNOSIS — Z8546 Personal history of malignant neoplasm of prostate: Secondary | ICD-10-CM | POA: Insufficient documentation

## 2020-05-06 DIAGNOSIS — I509 Heart failure, unspecified: Secondary | ICD-10-CM | POA: Diagnosis not present

## 2020-05-06 DIAGNOSIS — Z20822 Contact with and (suspected) exposure to covid-19: Secondary | ICD-10-CM | POA: Diagnosis not present

## 2020-05-06 DIAGNOSIS — Z7982 Long term (current) use of aspirin: Secondary | ICD-10-CM | POA: Diagnosis not present

## 2020-05-06 DIAGNOSIS — Z79899 Other long term (current) drug therapy: Secondary | ICD-10-CM | POA: Diagnosis not present

## 2020-05-06 DIAGNOSIS — J9811 Atelectasis: Secondary | ICD-10-CM | POA: Diagnosis not present

## 2020-05-06 HISTORY — DX: Heart failure, unspecified: I50.9

## 2020-05-06 LAB — CBC
HCT: 39.6 % (ref 39.0–52.0)
Hemoglobin: 12.1 g/dL — ABNORMAL LOW (ref 13.0–17.0)
MCH: 27.1 pg (ref 26.0–34.0)
MCHC: 30.6 g/dL (ref 30.0–36.0)
MCV: 88.6 fL (ref 80.0–100.0)
Platelets: 177 10*3/uL (ref 150–400)
RBC: 4.47 MIL/uL (ref 4.22–5.81)
RDW: 13.9 % (ref 11.5–15.5)
WBC: 3.4 10*3/uL — ABNORMAL LOW (ref 4.0–10.5)
nRBC: 0 % (ref 0.0–0.2)

## 2020-05-06 LAB — BASIC METABOLIC PANEL
Anion gap: 11 (ref 5–15)
BUN: 22 mg/dL (ref 8–23)
CO2: 24 mmol/L (ref 22–32)
Calcium: 10.1 mg/dL (ref 8.9–10.3)
Chloride: 106 mmol/L (ref 98–111)
Creatinine, Ser: 1.77 mg/dL — ABNORMAL HIGH (ref 0.61–1.24)
GFR, Estimated: 40 mL/min — ABNORMAL LOW (ref >60)
Glucose, Bld: 119 mg/dL — ABNORMAL HIGH (ref 70–99)
Potassium: 4 mmol/L (ref 3.5–5.1)
Sodium: 141 mmol/L (ref 135–145)

## 2020-05-06 LAB — BRAIN NATRIURETIC PEPTIDE: B Natriuretic Peptide: 1524 pg/mL — ABNORMAL HIGH (ref 0.0–100.0)

## 2020-05-06 LAB — RESP PANEL BY RT-PCR (FLU A&B, COVID) ARPGX2
Influenza A by PCR: NEGATIVE
Influenza B by PCR: NEGATIVE
SARS Coronavirus 2 by RT PCR: NEGATIVE

## 2020-05-06 MED ORDER — LOSARTAN POTASSIUM 50 MG PO TABS
100.0000 mg | ORAL_TABLET | Freq: Every day | ORAL | Status: DC
  Administered 2020-05-06: 14:00:00 100 mg via ORAL
  Filled 2020-05-06: qty 2

## 2020-05-06 MED ORDER — HYDRALAZINE HCL 25 MG PO TABS
50.0000 mg | ORAL_TABLET | Freq: Once | ORAL | Status: AC
  Administered 2020-05-06: 14:00:00 50 mg via ORAL
  Filled 2020-05-06: qty 2

## 2020-05-06 MED ORDER — CLONIDINE HCL 0.2 MG PO TABS
0.3000 mg | ORAL_TABLET | Freq: Two times a day (BID) | ORAL | Status: DC
  Administered 2020-05-06: 14:00:00 0.3 mg via ORAL
  Filled 2020-05-06 (×2): qty 1

## 2020-05-06 MED ORDER — AMLODIPINE BESYLATE 5 MG PO TABS
10.0000 mg | ORAL_TABLET | Freq: Every day | ORAL | Status: DC
  Administered 2020-05-06: 14:00:00 10 mg via ORAL
  Filled 2020-05-06: qty 2

## 2020-05-06 NOTE — ED Notes (Signed)
Ambulated with no issues 95% on RA, denied SOB or lightheadedness

## 2020-05-06 NOTE — ED Notes (Signed)
Patient discharged, vetrbalized understanding of DC instructions.  ambulated to [place]   0/ 10 pain

## 2020-05-06 NOTE — ED Provider Notes (Signed)
[facility]ERG[facility] DEPARTMENT Provider Note  CSN: [id number] Arrival date & time: 05/06/20 0932    History Chief Complaint  Patient presents with   Shortness of Breath    HPI  Corey [name] is a 74 y.o. male with history of CHF, diastolic dysfunction and chronic pericardial effusion and DOE reports increased SOB since yesterday. He reports occasional mild cough. No chest pain or fever. He went to [facility] and was directed to the ED for evaluation. He denies any known Covid exposures, but that is his main concern today. He has been taking his medications as directed except he missed his morning meds today.    Past Medical History:  Diagnosis Date   Cataract    CHF (congestive heart failure) ([place])    Colon polyp 2003   Dr [name]; F/U was to be 2008( not completed)   CVA (cerebral infarction) 2014   Diabetes mellitus    Diverticulosis 2003   Dyspnea    with exertion   Gout    Hypertension    Myocardial infarction ([place]) 2014   OSA (obstructive sleep apnea) 03/27/2018   Pneumonia 09/29/11   Avelox X 10 days as OP   Prostate cancer ([place])    Renal insufficiency    Seizures ([place])    not treated for seizure disorder; had a seizure after stroke 2014; no seizure since then    Past Surgical History:  Procedure Laterality Date   COLONOSCOPY W/ POLYPECTOMY  2003   no F/U ([place] discussed 12/03/12)   INSERTION OF MESH N/A 07/16/2017   Procedure: INSERTION OF MESH;  Surgeon: [name], MD;  Location: [facility] OR;  Service: General;  Laterality: N/A;   PEG PLACEMENT  2014   PEG TUBE REMOVAL  2014   PROSTATE BIOPSY     TRACHEOSTOMY  2014   was closed after hospital stay   [facility] N/A 07/16/2017   Procedure: [place];  Surgeon: [name], MD;  Location: [facility];  Service: General;  Laterality: N/A;   wrist aspiration  02/16/12    monosodium urate crystals; Dr Caralyn Guile    Family History  Problem  Relation Age of Onset   Hypertension Father    Prostate cancer Father    Diabetes Father    Cancer Father    Hypertension Mother    Diabetes Mother    Stroke Neg Hx    Heart disease Neg Hx    Colon cancer Neg Hx     Social History   Tobacco Use   Smoking status: Former Smoker    Packs/day: 0.25    Years: 1.00    Pack years: 0.25    Types: Cigarettes    Quit date: 05/15/1968    Years since quitting: 52.0   Smokeless tobacco: Never Used   Tobacco comment: smoked [place], up to 1-2 cigarettes/ day  Vaping Use   Vaping Use: Never used  Substance Use Topics   Alcohol use: No    Alcohol/week: 0.0 standard drinks   Drug use: No     Home Medications Prior to Admission medications   Medication Sig Start Date End Date Taking? Authorizing Provider  allopurinol (ZYLOPRIM) 100 MG tablet Take 0.5 tablets (50 mg total) by mouth daily. Patient taking differently: Take 50 mg by mouth as needed (gout/foot swelling). 03/18/20  Yes Burns, [name], MD  amLODipine (NORVASC) 10 MG tablet Take 1 tablet (10 mg total) by mouth daily. 01/28/20  Yes [name], MD  aspirin EC 81 MG tablet Take 1 tablet (81 mg total) by mouth daily. 01/23/17  Yes Burns, [name], MD  cloNIDine (CATAPRES) 0.3 MG tablet TAKE 1 TABLET TWICE DAILY Patient taking differently: Take 0.3 mg by mouth 2 (two) times daily. 04/15/20  Yes Burns, [name], MD  colchicine (COLCRYS) 0.6 MG tablet Take 2 tabs once and then one tab one hour later as needed for gout Patient taking differently: Take 1.2 mg by mouth See admin instructions. Take 2 tabs once and then one tab one hour later as needed for gout 03/16/20  Yes Burns, [name], MD  furosemide (LASIX) 20 MG tablet Take 1 tablet by mouth once daily in the morning 02/20/20  Yes Burns, [name], MD  glimepiride (AMARYL) 1 MG tablet Take 0.5 tablets (0.5 mg total) by mouth at bedtime. Patient taking differently: Take 1 mg by mouth at bedtime. 01/28/20  Yes Burns, [name],  MD  hydrALAZINE (APRESOLINE) 50 MG tablet TAKE 1 AND 1/2 TABLETS THREE TIMES DAILY Patient taking differently: Take 75 mg by mouth 3 (three) times daily. 11/19/19  Yes Jerline Pain, MD  isosorbide mononitrate (IMDUR) 60 MG 24 hr tablet TAKE 1 TABLET TWICE DAILY (FOLLOW-UP APPOINTMENT IS DUE. MUST SEE PROVIDER FOR FUTURE REFILLS) Patient taking differently: Take 60 mg by mouth in the morning and at bedtime. 03/25/20  Yes Burns, [name], MD  losartan (COZAAR) 100 MG tablet TAKE 1 TABLET EVERY DAY Patient taking differently: Take 100 mg by mouth daily. 01/28/20  Yes Burns, [name], MD  metFORMIN (GLUCOPHAGE) 500 MG tablet Take 1 tablet (500 mg total) by mouth 2 (two) times daily with a meal. 04/14/20  Yes Elayne Snare, MD  TRUE METRIX BLOOD GLUCOSE TEST test strip TEST BLOOD SUGAR FOUR TIMES DAILY 04/15/20   Elayne Snare, MD     Allergies    Hydrochlorothiazide   Review of Systems   Review of Systems A comprehensive review of systems was completed and negative except as noted in HPI.    Physical Exam BP (!) 210/117    Pulse 78    Temp 98.2 F (36.8 C) (Oral)    Resp (!) 22    Ht 5\' 6"  (1.676 m)    Wt 90.7 kg    SpO2 95%    BMI 32.28 kg/m   Physical Exam Vitals and nursing note reviewed.  Constitutional:      Appearance: Normal appearance.  HENT:     Head: Normocephalic and atraumatic.     Nose: Nose normal.     Mouth/Throat:     Mouth: Mucous membranes are moist.  Eyes:     Extraocular Movements: Extraocular movements intact.     Conjunctiva/sclera: Conjunctivae normal.  Cardiovascular:     Rate and Rhythm: Normal rate.  Pulmonary:     Effort: Pulmonary effort is normal.     Breath sounds: Normal breath sounds.  Abdominal:     General: Abdomen is flat.     Palpations: Abdomen is soft.     Tenderness: There is no abdominal tenderness.  Musculoskeletal:        General: No swelling. Normal range of motion.     Cervical back: Neck supple.     Right lower leg: Edema present.      Left lower leg: Edema present.  Skin:    General: Skin is warm and dry.  Neurological:     General: No focal deficit present.  Mental Status: He is alert.  Psychiatric:        Mood and Affect: Mood normal.      ED Results / Procedures / Treatments   Labs (all labs ordered are listed, but only abnormal results are displayed) Labs Reviewed  BASIC METABOLIC PANEL - Abnormal; Notable for the following components:      Result Value   Glucose, Bld 119 (*)    Creatinine, Ser 1.77 (*)    GFR, Estimated 40 (*)    All other components within normal limits  CBC - Abnormal; Notable for the following components:   WBC 3.4 (*)    Hemoglobin 12.1 (*)    All other components within normal limits  BRAIN NATRIURETIC PEPTIDE - Abnormal; Notable for the following components:   B Natriuretic Peptide 1,524.0 (*)    All other components within normal limits  RESP PANEL BY RT-PCR (FLU A&B, COVID) [id number]    EKG EKG Interpretation  Date/Time:  Thursday May 06 2020 09:30:44 EST Ventricular Rate:  70 PR Interval:  242 QRS Duration: 106 QT Interval:  426 QTC Calculation: 460 R Axis:   -63 Text Interpretation: Artifact Appears to be sinus rhythm with 1st degree AV block Left anterior fascicular block Minimal voltage criteria for LVH, may be normal variant ( Cornell product ) Septal infarct , age undetermined Abnormal ECG Accounting for artifact No significant change since last tracing Confirmed by Calvert Cantor [phone]) on 05/06/2020 1:09:28 PM   Radiology DG Chest 2 View  Result Date: 05/06/2020 CLINICAL DATA:  Shortness of breath EXAM: CHEST - 2 VIEW COMPARISON:  June 05, 2019 FINDINGS: There is mild atelectatic change in the left mid lung. Lungs elsewhere are clear. There is generalized cardiac enlargement with pulmonary vascularity within normal limits. No adenopathy. No bone lesions. IMPRESSION: Mild left midlung atelectasis. Lungs elsewhere clear. Heart remains diffusely  enlarged. Pulmonary vascularity within normal limits. No adenopathy appreciable. Electronically Signed   By: Lowella Grip III M.D.   On: 05/06/2020 10:23    Procedures Procedures  Medications Ordered in the ED Medications  amLODipine (NORVASC) tablet 10 mg (10 mg Oral Given 05/06/20 1359)  cloNIDine (CATAPRES) tablet 0.3 mg (0.3 mg Oral Given 05/06/20 1359)  losartan (COZAAR) tablet 100 mg (100 mg Oral Given 05/06/20 1359)  hydrALAZINE (APRESOLINE) tablet 50 mg (50 mg Oral Given 05/06/20 1400)     MDM Rules/Calculators/A&P MDM Patient with SOB at baseline here with same. Lungs are clear on exam, CXR without overt edema. CBC with mild leukopenia, diff not done. BMP with CKD at baseline. His BNP is elevated of unclear significance in CKD patient with unknown baseline. Patient is most concerned about Covid so will check that for him. Otherwise he is looking well. No signs of respiratory distress and no hypoxia.   ED Course  I have reviewed the triage vital signs and the nursing notes.  Pertinent labs & imaging results that were available during my care of the patient were reviewed by me and considered in my medical decision making (see chart for details).  Clinical Course as of 05/06/20 1451  Thu May 06, 2020  1343 Home BP meds ordered since he missed his doses this morning.  [CS]  1428 Covid is neg. Patient remains asymptomatic. Will attempt ambulation with pulse ox and if he does well can go home with resumption of BP meds and close outpatient follow up. Patient is amenable to this plan.  [CS]  7654 Patient able to ambulate without difficulty,  no hypoxia or distress. Patient's BP remains elevated but no signs of end-organ damage so can continue with home meds and PCP follow up.  [CS]    Clinical Course User Index [CS] Truddie Hidden, MD    Final Clinical Impression(s) / ED Diagnoses Final diagnoses:  SOB (shortness of breath)  Hypertension, unspecified type    Rx / DC  Orders ED Discharge Orders    None       Truddie Hidden, MD 05/06/20 1451

## 2020-05-06 NOTE — ED Triage Notes (Signed)
Pt reports sob "for a while" now, denies chest pain. Hx of CHF, mild pitting edema noted to legs. Resp e.u at this time. Hypertensive in triage

## 2020-05-06 NOTE — ED Notes (Signed)
Patient is being discharged from the Urgent Care and sent to the Emergency Department via personal vehicle. Per provider Merrie Roof, patient is in need of higher level of care due to EKG changes. Patient is aware and verbalizes understanding of plan of care.   Vitals:   05/06/20 0854 05/06/20 0903  BP: (!) 194/118   Pulse:    Resp:    Temp:  98.8 F (37.1 C)  SpO2:

## 2020-05-06 NOTE — ED Triage Notes (Signed)
Pt states he has had mult episodes of SOB over last two days.  Denies dizziness, CP, any other symptoms but does state he has pain in his R scapula.    Hypertensive in triage.  Provider notified.

## 2020-05-10 ENCOUNTER — Encounter (HOSPITAL_COMMUNITY): Payer: Self-pay

## 2020-05-10 ENCOUNTER — Other Ambulatory Visit: Payer: Self-pay | Admitting: Family Medicine

## 2020-05-10 ENCOUNTER — Other Ambulatory Visit: Payer: Self-pay

## 2020-05-10 ENCOUNTER — Ambulatory Visit (HOSPITAL_COMMUNITY)
Admission: EM | Admit: 2020-05-10 | Discharge: 2020-05-10 | Disposition: A | Discharge status: Home / Self Care | Payer: Medicare HMO | Attending: Family Medicine | Admitting: Family Medicine

## 2020-05-10 DIAGNOSIS — B029 Zoster without complications: Secondary | ICD-10-CM | POA: Diagnosis not present

## 2020-05-10 DIAGNOSIS — R200 Anesthesia of skin: Secondary | ICD-10-CM | POA: Diagnosis not present

## 2020-05-10 DIAGNOSIS — I10 Essential (primary) hypertension: Secondary | ICD-10-CM

## 2020-05-10 MED ORDER — GABAPENTIN 300 MG PO CAPS: 300.0000 mg | ORAL_CAPSULE | Freq: Every day | ORAL | 0 refills | Status: DC

## 2020-05-10 NOTE — Discharge Instructions (Signed)
Take the gabapentin at bedtime for hand numbness See your doctor next month Your blood pressure is high.  Make sure you take all of your medicines every day

## 2020-05-10 NOTE — ED Triage Notes (Signed)
Pt presents with a rash on right arm X 1 week. Pt states he wants to be checked for shingles. Pt states his hands feel numb. Pt states he feels a burning sensation on his arm.

## 2020-05-10 NOTE — ED Provider Notes (Signed)
[facility]    CSN: [id number] Arrival date & time: 05/10/20  7341      History   Chief Complaint Chief Complaint  Patient presents with  . Rash       . Numbness    Left arm    HPI Corey [name] is a 74 y.o. male.   HPI   Patient has a rash on his right upper arm that is been present for about 2 weeks.  It started off with blisters and "pimples".  Now there are a number of small scabs.  At first it was painful.  Appears to be getting better.  He wonders if it is shingles. Had shingles vaccination. He has had his Covid vaccinations Patient has a history of poorly controlled hypertension.  He was in the emergency room on 05/06/2020 with a blood pressure of 210/117.  He stated he had not taken his medications but otherwise was not having any headache or chest pain.  He was short of breath.  EKG and chest x-ray were unremarkable.  Patient's blood pressure is again very high today.  Again he has not taken all his medicines.  Again he has no chest pain, visual change, headache Patient states he has developed numbness in both hands in the thumb and index finger.  He has had right carpal tunnel release in the past.  He is not having any neck pain.  No history of neck problems in the past.  No fall or injury.  He states the pain is worse at night keeping him awake.  During the day it comes and goes. Patient does have an appointment with his primary care doctor in January.  He is encouraged to keep this appointment He has well-controlled diabetes.  His last A1c is 6.7.  He does have a history of hypertension related kidney impairment, heart disease, and history of stroke.  Past Medical History:  Diagnosis Date  . Cataract   . CHF (congestive heart failure) ([place])   . Colon polyp 2003   Dr [name]; F/U was to be 2008( not completed)  . CVA (cerebral infarction) 2014  . Diabetes mellitus   . Diverticulosis 2003  . Dyspnea    with exertion  . Gout   . Hypertension   .  Myocardial infarction ([place]) 2014  . OSA (obstructive sleep apnea) 03/27/2018  . Pneumonia 09/29/11   Avelox X 10 days as OP  . Prostate cancer ([place])   . Renal insufficiency   . Seizures ([place])    not treated for seizure disorder; had a seizure after stroke 2014; no seizure since then    Patient Active Problem List   Diagnosis Date Noted  . Acute respiratory disease due to COVID-19 virus 06/05/2019  . DOE (dyspnea on exertion) 03/01/2019  . Mild cognitive impairment 12/05/2018  . Slurred speech 07/23/2018  . Muscle twitching 07/23/2018  . OSA (obstructive sleep apnea) 03/27/2018  . Snores 01/21/2018  . Poor balance 01/23/2017  . Ankle swelling, left 09/06/2016  . Bradycardia 01/28/2016  . Weak urinary stream 12/31/2015  . Stage 3b chronic kidney disease 09/15/2015  . Ventral hernia without obstruction or gangrene 09/15/2015  . Prostate cancer ([place]) 07/25/2015  . Erectile dysfunction 05/04/2015  . Urinary frequency 05/04/2015  . 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 Abnormal finding on MRI of brain 02/14/2015  . PEA (Pulseless electrical activity) ([place]) 12/03/2012  . Diabetes ([place]) 06/30/2011  . Gout 03/05/2007  . Essential hypertension 03/05/2007  Past Surgical History:  Procedure Laterality Date  . COLONOSCOPY W/ POLYPECTOMY  2003   no F/U ([place] discussed 12/03/12)  . INSERTION OF MESH N/A 07/16/2017   Procedure: INSERTION OF MESH;  Surgeon: [name], MD;  Location: [facility];  Service: General;  Laterality: N/A;  . PEG PLACEMENT  2014  . PEG TUBE REMOVAL  2014  . PROSTATE BIOPSY    . TRACHEOSTOMY  2014   was closed after hospital stay  . VENTRAL HERNIA REPAIR N/A 07/16/2017   Procedure: LAPAROSCOPIC VENTRAL HERNIA REPAIR WITH MESH;  Surgeon: [name], MD;  Location: [facility];  Service: General;  Laterality: N/A;  . wrist aspiration  02/16/12    monosodium urate crystals; Dr Caralyn Guile       Home Medications    Prior to Admission  medications   Medication Sig Start Date End Date Taking? Authorizing Provider  gabapentin (NEURONTIN) 300 MG capsule Take 1 capsule (300 mg total) by mouth at bedtime. 05/10/20  Yes Raylene Everts, MD  allopurinol (ZYLOPRIM) 100 MG tablet Take 0.5 tablets (50 mg total) by mouth daily. Patient taking differently: Take 50 mg by mouth as needed (gout/foot swelling). 03/18/20   [name], MD  amLODipine (NORVASC) 10 MG tablet Take 1 tablet (10 mg total) by mouth daily. 01/28/20   [name], MD  aspirin EC 81 MG tablet Take 1 tablet (81 mg total) by mouth daily. 01/23/17   [name], MD  cloNIDine (CATAPRES) 0.3 MG tablet TAKE 1 TABLET TWICE DAILY Patient taking differently: Take 0.3 mg by mouth 2 (two) times daily. 04/15/20   [name], MD  colchicine (COLCRYS) 0.6 MG tablet Take 2 tabs once and then one tab one hour later as needed for gout Patient taking differently: Take 1.2 mg by mouth See admin instructions. Take 2 tabs once and then one tab one hour later as needed for gout 03/16/20   [name], MD  furosemide (LASIX) 20 MG tablet Take 1 tablet by mouth once daily in the morning 02/20/20   [name], MD  glimepiride (AMARYL) 1 MG tablet Take 0.5 tablets (0.5 mg total) by mouth at bedtime. Patient taking differently: Take 1 mg by mouth at bedtime. 01/28/20   [name], MD  hydrALAZINE (APRESOLINE) 50 MG tablet TAKE 1 AND 1/2 TABLETS THREE TIMES DAILY Patient taking differently: Take 75 mg by mouth 3 (three) times daily. 11/19/19   Jerline Pain, MD  isosorbide mononitrate (IMDUR) 60 MG 24 hr tablet TAKE 1 TABLET TWICE DAILY (FOLLOW-UP APPOINTMENT IS DUE. MUST SEE PROVIDER FOR FUTURE REFILLS) Patient taking differently: Take 60 mg by mouth in the morning and at bedtime. 03/25/20   [name], MD  losartan (COZAAR) 100 MG tablet TAKE 1 TABLET EVERY DAY Patient taking differently: Take 100 mg by mouth daily. 01/28/20   [name], MD  metFORMIN (GLUCOPHAGE) 500  MG tablet Take 1 tablet (500 mg total) by mouth 2 (two) times daily with a meal. 04/14/20   Elayne Snare, MD  TRUE METRIX BLOOD GLUCOSE TEST test strip TEST BLOOD SUGAR FOUR TIMES DAILY 04/15/20   Elayne Snare, MD    Family History Family History  Problem Relation Age of Onset  . Hypertension Father   . Prostate cancer Father   . Diabetes Father   . Cancer Father   . Hypertension Mother   . Diabetes Mother   . Stroke Neg Hx   .  Heart disease Neg Hx   . Colon cancer Neg Hx     Social History Social History   Tobacco Use  . Smoking status: Former Smoker    Packs/day: 0.25    Years: 1.00    Pack years: 0.25    Types: Cigarettes    Quit date: 05/15/1968    Years since quitting: 52.0  . Smokeless tobacco: Never Used  . Tobacco comment: smoked [place], up to 1-2 cigarettes/ day  Vaping Use  . Vaping Use: Never used  Substance Use Topics  . Alcohol use: No    Alcohol/week: 0.0 standard drinks  . Drug use: No     Allergies   Hydrochlorothiazide   Review of Systems Review of Systems See HPI  Physical Exam Triage Vital Signs ED Triage Vitals  Enc Vitals Group     BP 05/10/20 1159 (!) 212/118     Pulse Rate 05/10/20 1159 67     Resp 05/10/20 1159 (!) 25     Temp 05/10/20 1159 98.3 F (36.8 C)     Temp Source 05/10/20 1159 Oral     SpO2 05/10/20 1159 97 %     Weight --      Height --      Head Circumference --      Peak Flow --      Pain Score 05/10/20 1157 8     Pain Loc --      Pain Edu? --      Excl. in [place]? --    No data found.  Updated Vital Signs BP (!) 191/106 (BP Location: Right Arm)   Pulse 67   Temp 98.3 F (36.8 C) (Oral)   Resp (!) 25   SpO2 97%      Physical Exam Constitutional:      General: He is not in acute distress.    Appearance: He is well-developed and well-nourished.     Comments: Pleasant in no distress  HENT:     Head: Normocephalic and atraumatic.     Mouth/Throat:     Mouth: Oropharynx is clear and moist.  Eyes:      Conjunctiva/sclera: Conjunctivae normal.     Pupils: Pupils are equal, round, and reactive to light.  Neck:     Comments: Full range of motion of neck.  No muscular tenderness. Cardiovascular:     Rate and Rhythm: Normal rate.  Pulmonary:     Effort: Pulmonary effort is normal. No respiratory distress.  Abdominal:     General: There is no distension.     Palpations: Abdomen is soft.  Musculoskeletal:        General: No edema. Normal range of motion.     Cervical back: Normal range of motion.     Comments: Patient has a mass on the dorsum of his right carpal region, and on the volar surface.  The volar mass is actually quite large.  I believe it is causing lection contractures of his third fourth and fifth fingers.-Encouraged him to see his PCP, and needs to likely see hand specialty.  Sensory exam is normal bilaterally.  He has a well-healed left carpal tunnel scar.  Skin:    General: Skin is warm and dry.          Comments: Patient has a healing rash in the area described with multiple tiny eschars that measure 2 to 4 mm across  Neurological:     Mental Status: He is alert.  Psychiatric:  Behavior: Behavior normal.      UC Treatments / Results  Labs (all labs ordered are listed, but only abnormal results are displayed) Labs Reviewed - No data to display  EKG   Radiology No results found.  Procedures Procedures (including critical care time)  Medications Ordered in UC Medications - No data to display  Initial Impression / Assessment and Plan / UC Course  I have reviewed the triage vital signs and the nursing notes.  Pertinent labs & imaging results that were available during my care of the patient were reviewed by me and considered in my medical decision making (see chart for details).     I do believe this rash is a healing shingles rash.  Discussed with patient that he needs to talk to his primary care provider about shingles vaccination    Uncertain  causation of finger numbness.  If it is bilateral, it is likely from his neck.  We will give him gabapentin at night and having follow-up with his primary care doctor Uncontrolled hypertension.  Discussed with patient.  He already has kidney impairment.  Importance of taking all medicines each day is reviewed  Final Clinical Impressions(s) / UC Diagnoses   Final diagnoses:  Herpes zoster without complication  Uncontrolled hypertension  Bilateral hand numbness     Discharge Instructions     Take the gabapentin at bedtime for hand numbness See your doctor next month Your blood pressure is high.  Make sure you take all of your medicines every day   ED Prescriptions    Medication Sig Dispense Auth. Provider   gabapentin (NEURONTIN) 300 MG capsule Take 1 capsule (300 mg total) by mouth at bedtime. 30 capsule Raylene Everts, MD     PDMP not reviewed this encounter.   Raylene Everts, MD 05/10/20 [phone]

## 2020-05-11 ENCOUNTER — Other Ambulatory Visit: Payer: Self-pay | Admitting: Family Medicine

## 2020-05-11 ENCOUNTER — Telehealth: Payer: Self-pay | Admitting: Internal Medicine

## 2020-05-11 NOTE — Telephone Encounter (Signed)
gabapentin (NEURONTIN) 300 MG capsule Patients wife [name] calling because she is unsure if she should give this medication to the patient because a side effect is shortness of breath and he doesn't have an inhaler anymore from when he went to the ED for shob.  [phone]

## 2020-05-11 NOTE — Telephone Encounter (Signed)
Patients wife wondering if he can get the shingles shot while he has shingles.

## 2020-05-12 ENCOUNTER — Other Ambulatory Visit: Payer: Self-pay | Admitting: Cardiology

## 2020-05-12 ENCOUNTER — Other Ambulatory Visit: Payer: Self-pay

## 2020-05-12 NOTE — Patient Outreach (Signed)
[facility]) Care Management  05/12/2020  JAKOBE BLAU 1945/09/27 [id number]   Telephone Assessment  Telephone Screen  Referral Date: 05/11/2020 Referral Source: Nurse Call Center Referral Reason:  05/11/20 1131- Inbound call from patient spouse stating she took her husband to urgent care yesterday and he was diagnosed with herpes zoster on his shoulder. He was started on gabapentin 300mg  for the neuro symptoms. She was inquiring to see if he needed to go ahead and get shingrix shot right now or wait. Per protocol, I explained that the vaccine won't do anything for his current rash but would help prevent future rashes as it is 90% effective at prevention. She agreed to wait until rash got a little better and then get the shot. Discussed the typical process of Shingles and what to expect. She wanted to know if the gabapentin was safe to give along with his other medications. I explained that I do not have a list of his medical issues or medications and she would need to follow up with PCP or the provider at urgent care for that question. She already has a call in place to PCP to call her back so she will ask him during that phone call. She wanted to know how to get a refill on patient's inhaler, as well. I advised her to ask PCP during the call with them this afternoon and she understood. She denies any further needs at this time. She has our phone number if case she has any more questions.  Insurance: [employer]   Unsuccessful outreach attempt to patient to follow up on recent call to 24 nurse line.      Plan: RN CM will make outreach attempt to patient within 3-4 business days.    Enzo Montgomery, RN,BSN,CCM [facility] Telephonic Care Management Coordinator Direct Phone: [phone] Toll Free: [phone] Fax: [phone]

## 2020-05-12 NOTE — Telephone Encounter (Signed)
Patient called back about albuterol, needs a refill. Awaiting call back .

## 2020-05-13 ENCOUNTER — Other Ambulatory Visit: Payer: Self-pay

## 2020-05-13 NOTE — Patient Outreach (Signed)
[name]) Care Management  05/13/2020  Corey [name] 09/22/1945 [id number]   Telephone Assessment  Telephone Screen  Referral Date: 05/11/2020 Referral Source: Nurse Call Center Referral Reason:  05/11/20 1131- Inbound call from patient spouse stating she took her husband to urgent care yesterday and he was diagnosed with herpes zoster on his shoulder. He was started on gabapentin 300mg  for the neuro symptoms. She was inquiring to see if he needed to go ahead and get shingrix shot right now or wait. Per protocol, I explained that the vaccine won't do anything for his current rash but would help prevent future rashes as it is 90% effective at prevention. She agreed to wait until rash got a little better and then get the shot. Discussed the typical process of Shingles and what to expect. She wanted to know if the gabapentin was safe to give along with his other medications. I explained that I do not have a list of his medical issues or medications and she would need to follow up with PCP or the provider at urgent care for that question. She already has a call in place to PCP to call her back so she will ask him during that phone call. She wanted to know how to get a refill on patient's inhaler, as well. I advised her to ask PCP during the call with them this afternoon and she understood. She denies any further needs at this time. She has our phone number if case she has any more questions.  Insurance: [employer]    Outreach attempt #2 to patient. No answer at present.     Plan: RN CM will make outreach attempt to patient within 3-4 business days. RN CM will send unsuccessful letter to patient.   Enzo Montgomery, RN,BSN,CCM [facility] Telephonic Care Management Coordinator Direct Phone: [phone] Toll Free: [phone] Fax: [phone]

## 2020-05-16 ENCOUNTER — Other Ambulatory Visit: Payer: Self-pay | Admitting: Internal Medicine

## 2020-05-16 ENCOUNTER — Other Ambulatory Visit: Payer: Self-pay | Admitting: Family Medicine

## 2020-05-16 DIAGNOSIS — U071 COVID-19: Secondary | ICD-10-CM

## 2020-05-16 MED ORDER — ALBUTEROL SULFATE HFA 108 (90 BASE) MCG/ACT IN AERS: 2.0000 | INHALATION_SPRAY | RESPIRATORY_TRACT | 1 refills | Status: DC | PRN

## 2020-05-17 ENCOUNTER — Other Ambulatory Visit: Payer: Self-pay

## 2020-05-17 NOTE — Patient Outreach (Signed)
[facility]) Care Management  05/17/2020  BRONISLAW SWITZER 05-Oct-1945 [id number]    Telephone Assessment  Telephone Screen  Referral Date:05/11/2020 Referral Source:Nurse Call Center Referral Reason: 05/11/20 1131- Inbound call from patient spouse stating she took her husband to urgent care yesterday and he was diagnosed with herpes zoster on his shoulder. He was started on gabapentin 300mg  for the neuro symptoms. She was inquiring to see if he needed to go ahead and get shingrix shot right now or wait. Per protocol, I explained that the vaccine won't do anything for his current rash but would help prevent future rashes as it is 90% effective at prevention. She agreed to wait until rash got a little better and then get the shot. Discussed the typical process of Shingles and what to expect. She wanted to know if the gabapentin was safe to give along with his other medications. I explained that I do not have a list of his medical issues or medications and she would need to follow up with PCP or the provider at urgent care for that question. She already has a call in place to PCP to call her back so she will ask him during that phone call. She wanted to know how to get a refill on patient's inhaler, as well. I advised her to ask PCP during the call with them this afternoon and she understood. She denies any further needs at this time. She has our phone number if case she has any more questions. Insurance:[employer]   Unsuccessful outreach attempt #3 to patient.     Plan: RN CM will make outreach attempt to patient within the month of Feb if no return call from patient.   Enzo Montgomery, RN,BSN,CCM [place] Telephonic Care Management Coordinator Direct Phone: [phone] Toll Free: [phone] Fax: [phone]

## 2020-05-17 NOTE — Telephone Encounter (Signed)
Albuterol was sent 1/2

## 2020-05-18 NOTE — Telephone Encounter (Signed)
Message left for patient to check with pharmacy regarding refill as it was sent on 05/16/20.  Also left message it was okay to take the Melatonin.

## 2020-05-18 NOTE — Telephone Encounter (Signed)
yes

## 2020-05-28 ENCOUNTER — Other Ambulatory Visit: Payer: Self-pay

## 2020-05-31 ENCOUNTER — Other Ambulatory Visit: Payer: Medicare HMO

## 2020-06-01 ENCOUNTER — Other Ambulatory Visit: Payer: Self-pay

## 2020-06-01 ENCOUNTER — Other Ambulatory Visit: Payer: Medicare HMO

## 2020-06-02 ENCOUNTER — Other Ambulatory Visit (INDEPENDENT_AMBULATORY_CARE_PROVIDER_SITE_OTHER): Payer: Medicare HMO

## 2020-06-02 DIAGNOSIS — E119 Type 2 diabetes mellitus without complications: Secondary | ICD-10-CM | POA: Diagnosis not present

## 2020-06-02 DIAGNOSIS — E1165 Type 2 diabetes mellitus with hyperglycemia: Secondary | ICD-10-CM | POA: Diagnosis not present

## 2020-06-02 LAB — BASIC METABOLIC PANEL
BUN: 32 mg/dL — ABNORMAL HIGH (ref 6–23)
CO2: 27 mEq/L (ref 19–32)
Calcium: 10.4 mg/dL (ref 8.4–10.5)
Chloride: 109 mEq/L (ref 96–112)
Creatinine, Ser: 1.87 mg/dL — ABNORMAL HIGH (ref 0.40–1.50)
GFR: 35.58 mL/min — ABNORMAL LOW (ref >60.00)
Glucose, Bld: 100 mg/dL — ABNORMAL HIGH (ref 70–99)
Potassium: 4.3 mEq/L (ref 3.5–5.1)
Sodium: 142 mEq/L (ref 135–145)

## 2020-06-02 LAB — HEMOGLOBIN A1C: Hgb A1c MFr Bld: 6.3 % (ref 4.6–6.5)

## 2020-06-07 ENCOUNTER — Other Ambulatory Visit: Payer: Self-pay

## 2020-06-07 ENCOUNTER — Encounter: Payer: Self-pay | Admitting: Endocrinology

## 2020-06-07 ENCOUNTER — Ambulatory Visit (INDEPENDENT_AMBULATORY_CARE_PROVIDER_SITE_OTHER): Payer: Medicare HMO | Admitting: Endocrinology

## 2020-06-07 VITALS — BP 132/78 | HR 70 | Ht 66.0 in | Wt 184.4 lb

## 2020-06-07 DIAGNOSIS — I10 Essential (primary) hypertension: Secondary | ICD-10-CM

## 2020-06-07 DIAGNOSIS — E119 Type 2 diabetes mellitus without complications: Secondary | ICD-10-CM

## 2020-06-07 DIAGNOSIS — N289 Disorder of kidney and ureter, unspecified: Secondary | ICD-10-CM

## 2020-06-07 NOTE — Progress Notes (Signed)
Patient ID: Corey [name], male   DOB: [birth year], 75 y.o.   MRN: [id number]   Reason for Appointment : Followup of various problems  History of Present Illness          Diagnosis: Type 2 diabetes mellitus, date of diagnosis: 1997      Past history: The patient was initially treated with various oral hypoglycemic drugs and details are not available. About 2009 he was started on insulin and has either taken premixed insulin or Lantus once a day He had difficulty affording his Lantus insulin and had preferred to take premixed insulin Subsequently insulin was tapered off  Recent history:    Oral hypoglycemic drugs: Metformin ER 500 mg, 1 tablet twice daily, Amaryl 0.5 mg at bedtime  Current blood sugar patterns and problems:  His A1c is down to 6.3 compared to 6.7  Previous fructosamine was 320    His blood sugars appear to be near normal without any hypoglycemia  Previously on Metformin ER but he could not swallow well with the 750 mg tablet but is doing okay with the 500 mg tablet  Also no hypoglycemia with low-dose Amaryl  He is showing some variable weight with some weight loss recently but may be related to fluid changes  He thinks his appetite is fairly good but he may not eat his first meal until early afternoon  Not walking as he does not have his walking partner currently  No side effects from Metformin.  No nausea or feeling lethargic   Glucose monitoring:  done less than 1 time a day         Glucometer:  Accu-Chek       Blood Glucose readings from download:   RANGE 71-131 with readings averaging about 100 both morning and evening   Self-care:       Meals: 2-3 meals per day. Egg in am at 9-10 am or grits, rarely sandwich at lunch. His largest meal is at suppertime at about 8 pm.         Dietician visit: Most recent: 02/2014               Compliance with the medical regimen:  Fair.    Wt Readings from Last 3 Encounters:  06/07/20 184 lb 6.4 oz  (83.6 kg)  05/06/20 200 lb (90.7 kg)  04/14/20 203 lb (92.1 kg)    Lab Results  Component Value Date   HGBA1C 6.3 06/02/2020   HGBA1C 6.7 (H) 03/01/2020   HGBA1C 7.9 (H) 11/26/2019    Lab Results  Component Value Date   MICROALBUR 2.4 (H) 03/03/2020   LDLCALC 61 01/28/2020   CREATININE 1.87 (H) 06/02/2020   Lab on 06/02/2020  Component Date Value Ref Range Status  . Hgb A1c MFr Bld 06/02/2020 6.3  4.6 - 6.5 % Final   Glycemic Control Guidelines for People with Diabetes:Non Diabetic:  <6%Goal of Therapy: <7%Additional Action Suggested:  >8%   . Sodium 06/02/2020 142  135 - 145 mEq/L Final  . Potassium 06/02/2020 4.3  3.5 - 5.1 mEq/L Final  . Chloride 06/02/2020 109  96 - 112 mEq/L Final  . CO2 06/02/2020 27  19 - 32 mEq/L Final  . Glucose, Bld 06/02/2020 100* 70 - 99 mg/dL Final  . BUN 06/02/2020 32* 6 - 23 mg/dL Final  . Creatinine, Ser 06/02/2020 1.87* 0.40 - 1.50 mg/dL Final  . GFR 06/02/2020 35.58* >60.00 mL/min Final   Calculated using the CKD-EPI Creatinine Equation (2021)  .  Calcium 06/02/2020 10.4  8.4 - 10.5 mg/dL Final   Other active medical problems addressed: See review of systems   Allergies as of 06/07/2020      Reactions   Hydrochlorothiazide Other (See Comments)   Gout , uncontrolled diabetes and renal insufficiency      Medication List       Accurate as of June 07, 2020 11:08 AM. If you have any questions, ask your nurse or doctor.        albuterol 108 (90 Base) MCG/ACT inhaler Commonly known as: VENTOLIN HFA Inhale 2 puffs into the lungs every 4 (four) hours as needed for wheezing or shortness of breath.   allopurinol 100 MG tablet Commonly known as: ZYLOPRIM Take 0.5 tablets (50 mg total) by mouth daily. What changed:   when to take this  reasons to take this   amLODipine 10 MG tablet Commonly known as: NORVASC Take 1 tablet (10 mg total) by mouth daily.   aspirin EC 81 MG tablet Take 1 tablet (81 mg total) by mouth daily.    cloNIDine 0.3 MG tablet Commonly known as: CATAPRES TAKE 1 TABLET TWICE DAILY   colchicine 0.6 MG tablet Commonly known as: Colcrys Take 2 tabs once and then one tab one hour later as needed for gout What changed:   how much to take  how to take this  when to take this   furosemide 20 MG tablet Commonly known as: LASIX Take 1 tablet by mouth once daily in the morning   gabapentin 300 MG capsule Commonly known as: Neurontin Take 1 capsule (300 mg total) by mouth at bedtime.   glimepiride 1 MG tablet Commonly known as: AMARYL Take 0.5 tablets (0.5 mg total) by mouth at bedtime. What changed: how much to take   hydrALAZINE 50 MG tablet Commonly known as: APRESOLINE TAKE 1 AND 1/2 TABLETS THREE TIMES DAILY What changed: See the new instructions.   isosorbide mononitrate 60 MG 24 hr tablet Commonly known as: IMDUR TAKE 1 TABLET TWICE DAILY (FOLLOW-UP APPOINTMENT IS DUE. MUST SEE PROVIDER FOR FUTURE REFILLS) What changed: See the new instructions.   losartan 100 MG tablet Commonly known as: COZAAR TAKE 1 TABLET EVERY DAY   metFORMIN 500 MG tablet Commonly known as: GLUCOPHAGE Take 1 tablet (500 mg total) by mouth 2 (two) times daily with a meal.   True Metrix Blood Glucose Test test strip Generic drug: glucose blood TEST BLOOD SUGAR FOUR TIMES DAILY       Allergies:  Allergies  Allergen Reactions  . Hydrochlorothiazide Other (See Comments)    Gout , uncontrolled diabetes and renal insufficiency    Past Medical History:  Diagnosis Date  . Cataract   . CHF (congestive heart failure) ([place])   . Colon polyp 2003   Dr [name]; F/U was to be 2008( not completed)  . CVA (cerebral infarction) 2014  . Diabetes mellitus   . Diverticulosis 2003  . Dyspnea    with exertion  . Gout   . Hypertension   . Myocardial infarction ([place]) 2014  . OSA (obstructive sleep apnea) 03/27/2018  . Pneumonia 09/29/11   Avelox X 10 days as OP  . Prostate cancer ([place])   .  Renal insufficiency   . Seizures ([place])    not treated for seizure disorder; had a seizure after stroke 2014; no seizure since then    Past Surgical History:  Procedure Laterality Date  . COLONOSCOPY W/ POLYPECTOMY  2003   no F/U (  SOC discussed 12/03/12)  . INSERTION OF MESH N/A 07/16/2017   Procedure: INSERTION OF MESH;  Surgeon: [name], MD;  Location: [facility];  Service: General;  Laterality: N/A;  . PEG PLACEMENT  2014  . PEG TUBE REMOVAL  2014  . PROSTATE BIOPSY    . TRACHEOSTOMY  2014   was closed after hospital stay  . VENTRAL HERNIA REPAIR N/A 07/16/2017   Procedure: LAPAROSCOPIC VENTRAL HERNIA REPAIR WITH MESH;  Surgeon: [name], MD;  Location: [facility] OR;  Service: General;  Laterality: N/A;  . wrist aspiration  02/16/12    monosodium urate crystals; Dr Caralyn Guile    Family History  Problem Relation Age of Onset  . Hypertension Father   . Prostate cancer Father   . Diabetes Father   . Cancer Father   . Hypertension Mother   . Diabetes Mother   . Stroke Neg Hx   . Heart disease Neg Hx   . Colon cancer Neg Hx     Social History:  reports that he quit smoking about 52 years ago. His smoking use included cigarettes. He has a 0.25 pack-year smoking history. He has never used smokeless tobacco. He reports that he does not drink alcohol and does not use drugs.    Review of Systems   Lipids: Has normal lipids, not on a statin drug  Lab Results  Component Value Date   CHOL 125 01/28/2020   HDL 50 01/28/2020   LDLCALC 61 01/28/2020   TRIG 55 01/28/2020   CHOLHDL 2.5 01/28/2020    Eye exams: Last done in 1/20 without retinopathy     He has renal insufficiency of unclear etiology with variable creatinine levels as follows: Creatinine is slightly higher, previously may have improved with reducing losartan  Lab Results  Component Value Date   CREATININE 1.87 (H) 06/02/2020   CREATININE 1.77 (H) 05/06/2020   CREATININE 1.65 (H) 04/07/2020      HYPERTENSION: Currently on losartan 50 mg, amlodipine , clonidine 0.3 mg and hydralazine as well as metoprolol Not clear what doses he is taking, previously with low normal blood pressure he was told to leave off his amlodipine but still is taking this Currently managed by PCP and cardiologist  He says blood pressure at home is usually fairly good and recently about 120/80, occasionally higher  He also takes Lasix for edema   BP Readings from Last 3 Encounters:  06/07/20 132/78  05/10/20 (!) 191/106  05/06/20 (!) 161/96    Foot exam done in 3/20 with PCP   Physical Examination:  BP 132/78   Pulse 70   Ht 5\' 6"  (1.676 m)   Wt 184 lb 6.4 oz (83.6 kg)   SpO2 99%   BMI 29.76 kg/m       ASSESSMENT/PLAN:  Diabetes type 2, uncontrolled   See history of present illness for discussion of current diabetes management, blood sugar patterns and problems identified  A1c is 6.4  He is on metformin 500 mg twice daily and 0.5 mg Amaryl at bedtime Blood sugars are fairly good even with low-dose medications His weight has come down from starting Lasix Otherwise he is usually trying to eat healthy meals Not able to do any exercise recently  Because of his creatinine clearance being borderline at 35 will reduce his Metformin to only 1 tablet in the evening  HYPERTENSION: He is showing variable blood pressure readings recently Since his blood pressure is not consistently abnormal even at home he will  continue the same regimen and follow-up with his cardiologist and PCP   Renal dysfunction: Creatinine is slightly higher Likely needs nephrology consultation since he has chronic kidney disease not related to diabetes  Follow-up in 3 months  There are no Patient Instructions on file for this visit.   Elayne Snare 06/07/2020, 11:08 AM

## 2020-06-07 NOTE — Patient Instructions (Signed)
Stop am Metformin 

## 2020-06-08 ENCOUNTER — Other Ambulatory Visit: Payer: Self-pay | Admitting: Internal Medicine

## 2020-06-15 ENCOUNTER — Other Ambulatory Visit: Payer: Self-pay

## 2020-06-15 NOTE — Patient Outreach (Addendum)
[facility]) Care Management  06/15/2020  Corey [name] 30-Mar-1946 [id number]   Telephone Assessment Annual Assessment Quarterly Call   Unsuccessful outreach attempt to patient. No answer after multiple rings. RN CM left HIPAA compliant voicemail message along with contact info.   Plan: RN CM will make outreach attempt to patient within the month of May if no return call from patient. RN CM will send unsuccessful outreach letter to patient.   Enzo Montgomery, RN,BSN,CCM [facility] Telephonic Care Management Coordinator Direct Phone: [phone] Toll Free: [phone] Fax: [phone]

## 2020-06-18 ENCOUNTER — Telehealth: Payer: Medicare HMO

## 2020-06-18 NOTE — Progress Notes (Incomplete)
Chronic Care Management Pharmacy Note  06/18/2020 Name:  Corey [name] MRN:  [id number] DOB:  05-Apr-1946  Subjective: Corey [name] is an 75 y.o. year old male who is a primary patient of Burns, [name], MD.  The CCM team was consulted for assistance with disease management and care coordination needs.    Engaged with patient by telephone for follow up visit in response to provider referral for pharmacy case management and/or care coordination services.   Consent to Services:  The patient was given the following information about Chronic Care Management services today, agreed to services, and gave verbal consent: 1. CCM service includes personalized support from designated clinical staff supervised by the primary care provider, including individualized plan of care and coordination with other care providers 2. 24/7 contact phone numbers for assistance for urgent and routine care needs. 3. Service will only be billed when office clinical staff spend 20 minutes or more in a month to coordinate care. 4. Only one practitioner may furnish and bill the service in a calendar month. 5.The patient may stop CCM services at any time (effective at the end of the month) by phone call to the office staff. 6. The patient will be responsible for cost sharing (co-pay) of up to 20% of the service fee (after annual deductible is met). Patient agreed to services and consent obtained.  Patient Care Team: [name], MD as PCP - General (Internal Medicine) Jerline Pain, MD as PCP - Cardiology (Cardiology) Florance, Tomasa Blase, RN as [facility], [place], [facility] as Pharmacist (Pharmacist)  Recent office visits: ***  Recent consult visits: 06/07/20 Dr Dwyane Dee: reduce amlodipine to 1 tablet PM due to further GFR decline. Likely needs nephrology referral.  04/14/20 Dr Dwyane Dee: stop amlodipine 5 mg due to swelling, reduce metformin to 500 mg BID due to  GFR.  Objective:  Lab Results  Component Value Date   CREATININE 1.87 (H) 06/02/2020   BUN 32 (H) 06/02/2020   GFR 35.58 (L) 06/02/2020   GFRNONAA 40 (L) 05/06/2020   GFRAA 46 (L) 01/28/2020   NA 142 06/02/2020   K 4.3 06/02/2020   CALCIUM 10.4 06/02/2020   CO2 27 06/02/2020    Lab Results  Component Value Date/Time   HGBA1C 6.3 06/02/2020 09:27 AM   HGBA1C 6.7 (H) 03/01/2020 09:09 AM   FRUCTOSAMINE 287 (H) 03/01/2020 09:09 AM   FRUCTOSAMINE 320 (H) 11/26/2019 09:06 AM   GFR 35.58 (L) 06/02/2020 09:27 AM   GFR 41.39 (L) 04/07/2020 09:30 AM   MICROALBUR 2.4 (H) 03/03/2020 11:09 AM   MICROALBUR 2.5 (H) 12/20/2018 09:38 AM    Last diabetic Eye exam:  Lab Results  Component Value Date/Time   HMDIABEYEEXA No Retinopathy 05/27/2018 12:00 AM    Last diabetic Foot exam: No results found for: HMDIABFOOTEX   Lab Results  Component Value Date   CHOL 125 01/28/2020   HDL 50 01/28/2020   LDLCALC 61 01/28/2020   TRIG 55 01/28/2020   CHOLHDL 2.5 01/28/2020    Hepatic Function Latest Ref Rng & Units 04/07/2020 01/28/2020 11/26/2019  Total Protein 6.0 - 8.3 g/dL 5.9(L) 5.7(L) 6.6  Albumin 3.5 - 5.2 g/dL 3.6 - 4.0  AST 0 - 37 U/L 29 31 25   ALT 0 - 53 U/L 21 23 15   Alk Phosphatase 39 - 117 U/L 61 - 64  Total Bilirubin 0.2 - 1.2 mg/dL 0.6 0.7 0.7  Bilirubin, Direct 0.0 - 0.3 mg/dL - - -  Lab Results  Component Value Date/Time   TSH 0.243 (L) 06/06/2019 03:43 AM   TSH 1.13 01/23/2017 11:48 AM   TSH 1.06 12/03/2012 11:43 AM   FREET4 0.82 06/06/2019 03:43 AM    CBC Latest Ref Rng & Units 05/06/2020 01/28/2020 06/09/2019  WBC 4.0 - 10.5 K/uL 3.4(L) 2.9(L) 4.9  Hemoglobin 13.0 - 17.0 g/dL 12.1(L) 10.7(L) 13.6  Hematocrit 39.0 - 52.0 % 39.6 32.4(L) 42.5  Platelets 150 - 400 K/uL 177 169 233    No results found for: VD25OH  Clinical ASCVD: {YES/NO:21197} The ASCVD Risk score Mikey Bussing DC Jr., et al., 2013) failed to calculate for the following reasons:   The patient has a  prior MI or stroke diagnosis    Depression screen [facility] 2/9 06/12/2019 07/23/2018 04/13/2017  Decreased Interest 0 0 0  Down, Depressed, Hopeless 0 0 0  PHQ - 2 Score 0 0 0  Some recent data might be hidden     ***Other: (CHADS2VASc if Afib, MMRC or CAT for COPD, ACT, DEXA)  Social History   Tobacco Use  Smoking Status Former Smoker  . Packs/day: 0.25  . Years: 1.00  . Pack years: 0.25  . Types: Cigarettes  . Quit date: 05/15/1968  . Years since quitting: 52.1  Smokeless Tobacco Never Used  Tobacco Comment   smoked [place], up to 1-2 cigarettes/ day   BP Readings from Last 3 Encounters:  06/07/20 132/78  05/10/20 (!) 191/106  05/06/20 (!) 161/96   Pulse Readings from Last 3 Encounters:  06/07/20 70  05/10/20 67  05/06/20 62   Wt Readings from Last 3 Encounters:  06/07/20 184 lb 6.4 oz (83.6 kg)  05/06/20 200 lb (90.7 kg)  04/14/20 203 lb (92.1 kg)    Assessment/Interventions: Review of patient past medical history, allergies, medications, health status, including review of consultants reports, laboratory and other test data, was performed as part of comprehensive evaluation and provision of chronic care management services.   SDOH:  (Social Determinants of Health) assessments and interventions performed:   CCM Care Plan  Allergies  Allergen Reactions  . Hydrochlorothiazide Other (See Comments)    Gout , uncontrolled diabetes and renal insufficiency    Medications Reviewed Today    Reviewed by Roxanna Mew, CMA (Certified Medical Assistant) on 06/07/20 at 1101  Med List Status: <None>  Medication Order Taking? Sig Documenting Provider Last Dose Status Informant  albuterol (VENTOLIN HFA) 108 (90 Base) MCG/ACT inhaler [id number] Yes Inhale 2 puffs into the lungs every 4 (four) hours as needed for wheezing or shortness of breath. Janith Lima, MD Taking Active   allopurinol (ZYLOPRIM) 100 MG tablet [id number] Yes Take 0.5 tablets (50 mg total) by mouth daily.   Patient taking differently: Take 50 mg by mouth as needed (gout/foot swelling).   [name], MD Taking Active Self  amLODipine (NORVASC) 10 MG tablet [id number] Yes Take 1 tablet (10 mg total) by mouth daily. [name], MD Taking Active Self  aspirin EC 81 MG tablet [id number] Yes Take 1 tablet (81 mg total) by mouth daily. [name], MD Taking Active Self  cloNIDine (CATAPRES) 0.3 MG tablet [id number] Yes TAKE 1 TABLET TWICE DAILY  Patient taking differently: Take 0.3 mg by mouth 2 (two) times daily.   [name], MD Taking Active   colchicine (COLCRYS) 0.6 MG tablet [id number] Yes Take 2 tabs once and then one tab one hour later as needed for gout  Patient taking differently: Take  1.2 mg by mouth See admin instructions. Take 2 tabs once and then one tab one hour later as needed for gout   Burns, [name], MD Taking Active   furosemide (LASIX) 20 MG tablet [id number] Yes Take 1 tablet by mouth once daily in the morning Burns, [name], MD Taking Active Self  gabapentin (NEURONTIN) 300 MG capsule [id number] Yes Take 1 capsule (300 mg total) by mouth at bedtime. Raylene Everts, MD Taking Active   glimepiride [facility]) 1 MG tablet [id number] Yes Take 0.5 tablets (0.5 mg total) by mouth at bedtime.  Patient taking differently: Take 1 mg by mouth at bedtime.   [name], MD Taking Active Self  hydrALAZINE (APRESOLINE) 50 MG tablet [id number] Yes TAKE 1 AND 1/2 TABLETS THREE TIMES DAILY  Patient taking differently: Take 75 mg by mouth 3 (three) times daily.   Jerline Pain, MD Taking Active   isosorbide mononitrate (IMDUR) 60 MG 24 hr tablet [id number] Yes TAKE 1 TABLET TWICE DAILY (FOLLOW-UP APPOINTMENT IS DUE. MUST SEE PROVIDER FOR FUTURE REFILLS)  Patient taking differently: Take 60 mg by mouth in the morning and at bedtime.   [name], MD Taking Active   losartan (COZAAR) 100 MG tablet [id number] Yes TAKE 1 TABLET EVERY DAY  Patient taking differently: Take 100 mg by  mouth daily.   [name], MD Taking Active   metFORMIN (GLUCOPHAGE) 500 MG tablet [id number] Yes Take 1 tablet (500 mg total) by mouth 2 (two) times daily with a meal. Elayne Snare, MD Taking Active Self  TRUE METRIX BLOOD GLUCOSE TEST test strip [id number] No TEST BLOOD SUGAR FOUR TIMES DAILY  Patient not taking: Reported on 06/07/2020   Elayne Snare, MD Not Taking Active Self          Patient Active Problem List   Diagnosis Date Noted  . Acute respiratory disease due to COVID-19 virus 06/05/2019  . DOE (dyspnea on exertion) 03/01/2019  . Mild cognitive impairment 12/05/2018  . Slurred speech 07/23/2018  . Muscle twitching 07/23/2018  . OSA (obstructive sleep apnea) 03/27/2018  . Snores 01/21/2018  . Poor balance 01/23/2017  . Ankle swelling, left 09/06/2016  . Bradycardia 01/28/2016  . Weak urinary stream 12/31/2015  . Stage 3b chronic kidney disease 09/15/2015  . Ventral hernia without obstruction or gangrene 09/15/2015  . Prostate cancer ([place]) 07/25/2015  . Erectile dysfunction 05/04/2015  . Urinary frequency 05/04/2015  . 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 Abnormal finding on MRI of brain 02/14/2015  . PEA (Pulseless electrical activity) ([place]) 12/03/2012  . Diabetes ([place]) 06/30/2011  . Gout 03/05/2007  . Essential hypertension 03/05/2007    Immunization History  Administered Date(s) Administered  . Fluad Quad(high Dose 65+) 03/05/2019, 01/28/2020  . Influenza Split 03/02/2011  . Influenza Whole 01/23/2013  . Influenza, High Dose Seasonal PF 01/28/2016, 01/23/2017, 01/21/2018  . Influenza,inj,Quad PF,6+ Mos 02/23/2014, 02/24/2015  . PFIZER(Purple Top)SARS-COV-2 Vaccination 07/08/2019, 07/29/2019, 02/07/2020  . Pneumococcal Conjugate-13 05/04/2015  . Pneumococcal Polysaccharide-23 10/02/2012, 01/21/2018  . Tdap 12/15/2010, 09/21/2011    Conditions to be addressed/monitored:  {CCM ASSESSMENT DZ OPTIONS:25047}  There are no care plans  that you recently modified to display for this patient.    Medication Assistance: {MEDASSISTANCEINFO:25044}  Patient's preferred pharmacy is:  [facility] [address] ([address]), [place] - [address] [place] ([place]) [place] [address] Phone: [phone] Fax: [phone]  [facility] - [place], [place]  [place] [address] Phone: [phone] Fax: [phone]  Uses pill box? {Yes or If no, why not?:20788} Pt endorses ***% compliance  We discussed: {Pharmacy options:24294} Patient decided to: {US Pharmacy QHQI:16580}  Follow Up:  {FOLLOWUP:24991}  Plan: {CM FOLLOW UP PLAN:25073}  ***

## 2020-06-23 DIAGNOSIS — E119 Type 2 diabetes mellitus without complications: Secondary | ICD-10-CM | POA: Diagnosis not present

## 2020-06-23 DIAGNOSIS — H47013 Ischemic optic neuropathy, bilateral: Secondary | ICD-10-CM | POA: Diagnosis not present

## 2020-06-23 DIAGNOSIS — H2511 Age-related nuclear cataract, right eye: Secondary | ICD-10-CM | POA: Diagnosis not present

## 2020-06-23 DIAGNOSIS — Z961 Presence of intraocular lens: Secondary | ICD-10-CM | POA: Diagnosis not present

## 2020-06-24 ENCOUNTER — Ambulatory Visit: Payer: Self-pay

## 2020-06-25 ENCOUNTER — Ambulatory Visit: Payer: Self-pay

## 2020-06-28 ENCOUNTER — Telehealth: Payer: Self-pay | Admitting: Pharmacist

## 2020-06-28 NOTE — Progress Notes (Signed)
Chronic Care Management Pharmacy Assistant   Name: Corey [name]  MRN: [id number] DOB: 09-02-1945  Reason for Encounter: General Adherence Call   PCP : Corey Rail, MD  Allergies:   Allergies  Allergen Reactions   Hydrochlorothiazide Other (See Comments)    Gout , uncontrolled diabetes and renal insufficiency    Medications: Outpatient Encounter Medications as of 06/28/2020  Medication Sig   albuterol (VENTOLIN HFA) 108 (90 Base) MCG/ACT inhaler Inhale 2 puffs into the lungs every 4 (four) hours as needed for wheezing or shortness of breath.   allopurinol (ZYLOPRIM) 100 MG tablet Take 0.5 tablets (50 mg total) by mouth daily. (Patient taking differently: Take 50 mg by mouth as needed (gout/foot swelling).)   amLODipine (NORVASC) 10 MG tablet Take 1 tablet (10 mg total) by mouth daily.   aspirin EC 81 MG tablet Take 1 tablet (81 mg total) by mouth daily.   cloNIDine (CATAPRES) 0.3 MG tablet TAKE 1 TABLET TWICE DAILY (Patient taking differently: Take 0.3 mg by mouth 2 (two) times daily.)   colchicine (COLCRYS) 0.6 MG tablet Take 2 tabs once and then one tab one hour later as needed for gout (Patient taking differently: Take 1.2 mg by mouth See admin instructions. Take 2 tabs once and then one tab one hour later as needed for gout)   furosemide (LASIX) 20 MG tablet TAKE 1 TABLET EVERY MORNING   gabapentin (NEURONTIN) 300 MG capsule Take 1 capsule (300 mg total) by mouth at bedtime.   glimepiride (AMARYL) 1 MG tablet Take 0.5 tablets (0.5 mg total) by mouth at bedtime. (Patient taking differently: Take 1 mg by mouth at bedtime.)   hydrALAZINE (APRESOLINE) 50 MG tablet TAKE 1 AND 1/2 TABLETS THREE TIMES DAILY (Patient taking differently: Take 75 mg by mouth 3 (three) times daily.)   isosorbide mononitrate (IMDUR) 60 MG 24 hr tablet TAKE 1 TABLET TWICE DAILY (FOLLOW-UP APPOINTMENT IS DUE. MUST SEE PROVIDER FOR FUTURE REFILLS) (Patient taking differently: Take 60 mg by mouth  in the morning and at bedtime.)   losartan (COZAAR) 100 MG tablet TAKE 1 TABLET EVERY DAY (Patient taking differently: Take 100 mg by mouth daily.)   metFORMIN (GLUCOPHAGE) 500 MG tablet Take 1 tablet (500 mg total) by mouth 2 (two) times daily with a meal.   No facility-administered encounter medications on file as of 06/28/2020.    Current Diagnosis: Patient Active Problem List   Diagnosis Date Noted   Acute respiratory disease due to COVID-19 virus 06/05/2019   DOE (dyspnea on exertion) 03/01/2019   Mild cognitive impairment 12/05/2018   Slurred speech 07/23/2018   Muscle twitching 07/23/2018   OSA (obstructive sleep apnea) 03/27/2018   Snores 01/21/2018   Poor balance 01/23/2017   Ankle swelling, left 09/06/2016   Bradycardia 01/28/2016   Weak urinary stream 12/31/2015   Stage 3b chronic kidney disease 09/15/2015   Ventral hernia without obstruction or gangrene 09/15/2015   Prostate cancer ([place]) 07/25/2015   Erectile dysfunction 05/04/2015   Urinary frequency 05/04/2015   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   Abnormal finding on MRI of brain 02/14/2015   PEA (Pulseless electrical activity) ([place]) 12/03/2012   Diabetes ([place]) 06/30/2011   Gout 03/05/2007   Essential hypertension 03/05/2007    Goals Addressed   None     Follow-Up:  Pharmacist Review    A general adherence call was made to Corey [name] to check and see how he has been doing since he last spoke  with Corey [name] the clinical pharmacist. The patient was asked about his blood pressure and if there has been any changes. The patient states that his blood pressure lately has been up and down. He states that the last time he checked it was this morning and it was 140/80. The patient states that he has not had any dizziness, light headedness or headaches. He states that he does take amlodipine, isosorbide and losartan for his blood pressure daily. Also the patient stated that  his blood sugar is good. Sometimes it is between 80 and 104. He states that he checks his blood sugar when he gets up in the morning before he has anything to eat. He states that when he has eaten a piece of cake or something sweet like that, that his blood sugar will jump up over 250. He states that he does not have any symptoms when that happens, he will usually back off the sweets. The patient did say that he also walks about three times a week as well. I let the patient know that I will pass along the information to Corey [name].   Corey [name], [place] [phone]

## 2020-06-29 ENCOUNTER — Other Ambulatory Visit: Payer: Self-pay | Admitting: Endocrinology

## 2020-06-29 DIAGNOSIS — E1165 Type 2 diabetes mellitus with hyperglycemia: Secondary | ICD-10-CM

## 2020-06-30 ENCOUNTER — Ambulatory Visit: Payer: Medicare HMO | Admitting: Podiatry

## 2020-07-05 DIAGNOSIS — G4733 Obstructive sleep apnea (adult) (pediatric): Secondary | ICD-10-CM | POA: Diagnosis not present

## 2020-07-16 ENCOUNTER — Ambulatory Visit: Payer: Medicare HMO | Admitting: Cardiology

## 2020-07-20 ENCOUNTER — Ambulatory Visit: Payer: Medicare HMO | Admitting: Podiatry

## 2020-07-25 NOTE — Patient Instructions (Signed)
  Blood work was ordered.     Medications changes include :     Your prescription(s) have been submitted to your pharmacy. Please take as directed and contact our office if you believe you are having problem(s) with the medication(s).   A referral was ordered for        Someone from their office will call you to schedule an appointment.    Please followup in 6 months  

## 2020-07-25 NOTE — Progress Notes (Signed)
Subjective:    Patient ID: Corey [name], male    DOB: 10/25/1945, 75 y.o.   MRN: [id number]  HPI The patient is here for follow up of their chronic medical problems, including htn, DM, CKD    Medications and allergies reviewed with patient and updated if appropriate.  Patient Active Problem List   Diagnosis Date Noted  . Acute respiratory disease due to COVID-19 virus 06/05/2019  . DOE (dyspnea on exertion) 03/01/2019  . Mild cognitive impairment 12/05/2018  . Slurred speech 07/23/2018  . Muscle twitching 07/23/2018  . OSA (obstructive sleep apnea) 03/27/2018  . Snores 01/21/2018  . Poor balance 01/23/2017  . Ankle swelling, left 09/06/2016  . Bradycardia 01/28/2016  . Weak urinary stream 12/31/2015  . Stage 3b chronic kidney disease 09/15/2015  . Ventral hernia without obstruction or gangrene 09/15/2015  . Prostate cancer ([place]) 07/25/2015  . Erectile dysfunction 05/04/2015  . Urinary frequency 05/04/2015  . 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 Abnormal finding on MRI of brain 02/14/2015  . PEA (Pulseless electrical activity) ([place]) 12/03/2012  . Diabetes ([place]) 06/30/2011  . Gout 03/05/2007  . Essential hypertension 03/05/2007    Current Outpatient Medications on File Prior to Visit  Medication Sig Dispense Refill  . albuterol (VENTOLIN HFA) 108 (90 Base) MCG/ACT inhaler Inhale 2 puffs into the lungs every 4 (four) hours as needed for wheezing or shortness of breath. 18 g 1  . allopurinol (ZYLOPRIM) 100 MG tablet Take 0.5 tablets (50 mg total) by mouth daily. (Patient taking differently: Take 50 mg by mouth as needed (gout/foot swelling).) 45 tablet 1  . amLODipine (NORVASC) 10 MG tablet Take 1 tablet (10 mg total) by mouth daily. 90 tablet 1  . aspirin EC 81 MG tablet Take 1 tablet (81 mg total) by mouth daily.    . cloNIDine (CATAPRES) 0.3 MG tablet TAKE 1 TABLET TWICE DAILY (Patient taking differently: Take 0.3 mg by mouth 2 (two) times  daily.) 180 tablet 1  . colchicine (COLCRYS) 0.6 MG tablet Take 2 tabs once and then one tab one hour later as needed for gout (Patient taking differently: Take 1.2 mg by mouth See admin instructions. Take 2 tabs once and then one tab one hour later as needed for gout) 90 tablet 2  . furosemide (LASIX) 20 MG tablet TAKE 1 TABLET EVERY MORNING 90 tablet 0  . gabapentin (NEURONTIN) 300 MG capsule Take 1 capsule (300 mg total) by mouth at bedtime. 30 capsule 0  . glimepiride (AMARYL) 1 MG tablet TAKE 1/2 TABLET (0.5 MG TOTAL) BY MOUTH AT BEDTIME. 45 tablet 0  . hydrALAZINE (APRESOLINE) 50 MG tablet TAKE 1 AND 1/2 TABLETS THREE TIMES DAILY (Patient taking differently: Take 75 mg by mouth 3 (three) times daily.) 405 tablet 3  . isosorbide mononitrate (IMDUR) 60 MG 24 hr tablet TAKE 1 TABLET TWICE DAILY (FOLLOW-UP APPOINTMENT IS DUE. MUST SEE PROVIDER FOR FUTURE REFILLS) (Patient taking differently: Take 60 mg by mouth in the morning and at bedtime.) 180 tablet 1  . losartan (COZAAR) 100 MG tablet TAKE 1 TABLET EVERY DAY (Patient taking differently: Take 100 mg by mouth daily.) 90 tablet 1  . metFORMIN (GLUCOPHAGE) 500 MG tablet Take 1 tablet (500 mg total) by mouth 2 (two) times daily with a meal. 180 tablet 3   No current facility-administered medications on file prior to visit.    Past Medical History:  Diagnosis Date  . Cataract   . CHF (congestive  heart failure) ([place])   . Colon polyp 2003   Dr [name]; F/U was to be 2008( not completed)  . CVA (cerebral infarction) 2014  . Diabetes mellitus   . Diverticulosis 2003  . Dyspnea    with exertion  . Gout   . Hypertension   . Myocardial infarction ([place]) 2014  . OSA (obstructive sleep apnea) 03/27/2018  . Pneumonia 09/29/11   Avelox X 10 days as OP  . Prostate cancer ([place])   . Renal insufficiency   . Seizures ([place])    not treated for seizure disorder; had a seizure after stroke 2014; no seizure since then    Past Surgical History:   Procedure Laterality Date  . COLONOSCOPY W/ POLYPECTOMY  2003   no F/U ([place] discussed 12/03/12)  . INSERTION OF MESH N/A 07/16/2017   Procedure: INSERTION OF MESH;  Surgeon: [name], MD;  Location: [facility];  Service: General;  Laterality: N/A;  . PEG PLACEMENT  2014  . PEG TUBE REMOVAL  2014  . PROSTATE BIOPSY    . TRACHEOSTOMY  2014   was closed after hospital stay  . VENTRAL HERNIA REPAIR N/A 07/16/2017   Procedure: LAPAROSCOPIC VENTRAL HERNIA REPAIR WITH MESH;  Surgeon: [name], MD;  Location: [facility];  Service: General;  Laterality: N/A;  . wrist aspiration  02/16/12    monosodium urate crystals; Dr Caralyn Guile    Social History   Socioeconomic History  . Marital status: Married    Spouse name: [name]  . Number of children: 2  . Years of education: 15  . Highest education level: 12th grade  Occupational History  . Not on file  Tobacco Use  . Smoking status: Former Smoker    Packs/day: 0.25    Years: 1.00    Pack years: 0.25    Types: Cigarettes    Quit date: 05/15/1968    Years since quitting: 52.2  . Smokeless tobacco: Never Used  . Tobacco comment: smoked [place], up to 1-2 cigarettes/ day  Vaping Use  . Vaping Use: Never used  Substance and Sexual Activity  . Alcohol use: No    Alcohol/week: 0.0 standard drinks  . Drug use: No  . Sexual activity: Yes  Other Topics Concern  . Not on file  Social History Narrative   Walks three times a week, 2-3 miles, occasional walks on treadmill       Patient is right-handed. He lives with his wife in a one level home. He drinks 1 diet Mt. Dew a day and rarely has coffee. He walks 3 x a week for exercise.   Social Determinants of Health   Financial Resource Strain: Low Risk   . Difficulty of Paying Living Expenses: Not very hard  Food Insecurity: Not on file  Transportation Needs: Not on file  Physical Activity: Not on file  Stress: Not on file  Social Connections: Not on file    Family History   Problem Relation Age of Onset  . Hypertension Father   . Prostate cancer Father   . Diabetes Father   . Cancer Father   . Hypertension Mother   . Diabetes Mother   . Stroke Neg Hx   . Heart disease Neg Hx   . Colon cancer Neg Hx     Review of Systems     Objective:  There were no vitals filed for this visit. BP Readings from Last 3 Encounters:  06/07/20 132/78  05/10/20 (!) 191/106  05/06/20 (!) 161/96   Wt Readings from Last 3 Encounters:  06/07/20 184 lb 6.4 oz (83.6 kg)  05/06/20 200 lb (90.7 kg)  04/14/20 203 lb (92.1 kg)   There is no height or weight on file to calculate BMI.   Physical Exam    Constitutional: Appears well-developed and well-nourished. No distress.  HENT:  Head: Normocephalic and atraumatic.  Neck: Neck supple. No tracheal deviation present. No thyromegaly present.  No cervical lymphadenopathy Cardiovascular: Normal rate, regular rhythm and normal heart sounds.   No murmur heard. No carotid bruit .  No edema Pulmonary/Chest: Effort normal and breath sounds normal. No respiratory distress. No has no wheezes. No rales.  Skin: Skin is warm and dry. Not diaphoretic.  Psychiatric: Normal mood and affect. Behavior is normal.      Assessment & Plan:    See Problem List for Assessment and Plan of chronic medical problems.    This visit occurred during the SARS-CoV-2 public health emergency.  Safety protocols were in place, including screening questions prior to the visit, additional usage of staff PPE, and extensive cleaning of exam room while observing appropriate contact time as indicated for disinfecting solutions.    This encounter was created in error - please disregard.

## 2020-07-27 ENCOUNTER — Encounter: Payer: Medicare HMO | Admitting: Internal Medicine

## 2020-07-27 DIAGNOSIS — E1122 Type 2 diabetes mellitus with diabetic chronic kidney disease: Secondary | ICD-10-CM

## 2020-07-27 DIAGNOSIS — M1A9XX0 Chronic gout, unspecified, without tophus (tophi): Secondary | ICD-10-CM

## 2020-07-27 DIAGNOSIS — I10 Essential (primary) hypertension: Secondary | ICD-10-CM

## 2020-07-27 DIAGNOSIS — N1832 Chronic kidney disease, stage 3b: Secondary | ICD-10-CM

## 2020-07-29 NOTE — Patient Instructions (Addendum)
   Medications changes include : none     A referral was ordered for a hand orthopedic.        Someone from their office will call you to schedule an appointment.    Please followup in 6 months

## 2020-07-29 NOTE — Progress Notes (Signed)
Subjective:    Patient ID: Corey [name], male    DOB: 08-24-1945, 75 y.o.   MRN: [id number]  HPI The patient is here for follow up of their chronic medical problems, including htn, DM, CKD, gout   Left hand will not open.  It started 2-3 months - it was gradual onset.  It is difficult to use.  He can open his 4 fingers.    He is taking all of his medications as prescribed.    He is eating pretty healthy.  He walks for exercise.  He gets SOB with walking. The inhaler helps.      Medications and allergies reviewed with patient and updated if appropriate.  Patient Active Problem List   Diagnosis Date Noted  . Acute respiratory disease due to COVID-19 virus 06/05/2019  . DOE (dyspnea on exertion) 03/01/2019  . Mild cognitive impairment 12/05/2018  . Slurred speech 07/23/2018  . Muscle twitching 07/23/2018  . OSA (obstructive sleep apnea) 03/27/2018  . Snores 01/21/2018  . Poor balance 01/23/2017  . Ankle swelling, left 09/06/2016  . Bradycardia 01/28/2016  . Weak urinary stream 12/31/2015  . Stage 3b chronic kidney disease 09/15/2015  . Ventral hernia without obstruction or gangrene 09/15/2015  . Prostate cancer ([place]) 07/25/2015  . Erectile dysfunction 05/04/2015  . Urinary frequency 05/04/2015  . 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 Abnormal finding on MRI of brain 02/14/2015  . PEA (Pulseless electrical activity) ([place]) 12/03/2012  . Diabetes ([place]) 06/30/2011  . Gout 03/05/2007  . Essential hypertension 03/05/2007    Current Outpatient Medications on File Prior to Visit  Medication Sig Dispense Refill  . albuterol (VENTOLIN HFA) 108 (90 Base) MCG/ACT inhaler Inhale 2 puffs into the lungs every 4 (four) hours as needed for wheezing or shortness of breath. 18 g 1  . allopurinol (ZYLOPRIM) 100 MG tablet Take 0.5 tablets (50 mg total) by mouth daily. (Patient taking differently: Take 50 mg by mouth as needed (gout/foot swelling).) 45 tablet 1   . amLODipine (NORVASC) 10 MG tablet Take 1 tablet (10 mg total) by mouth daily. 90 tablet 1  . aspirin EC 81 MG tablet Take 1 tablet (81 mg total) by mouth daily.    . cloNIDine (CATAPRES) 0.3 MG tablet TAKE 1 TABLET TWICE DAILY (Patient taking differently: Take 0.3 mg by mouth 2 (two) times daily.) 180 tablet 1  . colchicine (COLCRYS) 0.6 MG tablet Take 2 tabs once and then one tab one hour later as needed for gout (Patient taking differently: Take 1.2 mg by mouth See admin instructions. Take 2 tabs once and then one tab one hour later as needed for gout) 90 tablet 2  . furosemide (LASIX) 20 MG tablet TAKE 1 TABLET EVERY MORNING 90 tablet 0  . gabapentin (NEURONTIN) 300 MG capsule Take 1 capsule (300 mg total) by mouth at bedtime. 30 capsule 0  . glimepiride (AMARYL) 1 MG tablet TAKE 1/2 TABLET (0.5 MG TOTAL) BY MOUTH AT BEDTIME. 45 tablet 0  . hydrALAZINE (APRESOLINE) 50 MG tablet TAKE 1 AND 1/2 TABLETS THREE TIMES DAILY (Patient taking differently: Take 75 mg by mouth 3 (three) times daily.) 405 tablet 3  . isosorbide mononitrate (IMDUR) 60 MG 24 hr tablet TAKE 1 TABLET TWICE DAILY (FOLLOW-UP APPOINTMENT IS DUE. MUST SEE PROVIDER FOR FUTURE REFILLS) (Patient taking differently: Take 60 mg by mouth in the morning and at bedtime.) 180 tablet 1  . losartan (COZAAR) 100 MG tablet TAKE 1 TABLET  EVERY DAY (Patient taking differently: Take 100 mg by mouth daily.) 90 tablet 1  . metFORMIN (GLUCOPHAGE) 500 MG tablet Take 1 tablet (500 mg total) by mouth 2 (two) times daily with a meal. 180 tablet 3   No current facility-administered medications on file prior to visit.    Past Medical History:  Diagnosis Date  . Cataract   . CHF (congestive heart failure) ([place])   . Colon polyp 2003   Dr [name]; F/U was to be 2008( not completed)  . CVA (cerebral infarction) 2014  . Diabetes mellitus   . Diverticulosis 2003  . Dyspnea    with exertion  . Gout   . Hypertension   . Myocardial infarction  ([place]) 2014  . OSA (obstructive sleep apnea) 03/27/2018  . Pneumonia 09/29/11   Avelox X 10 days as OP  . Prostate cancer ([place])   . Renal insufficiency   . Seizures ([place])    not treated for seizure disorder; had a seizure after stroke 2014; no seizure since then    Past Surgical History:  Procedure Laterality Date  . COLONOSCOPY W/ POLYPECTOMY  2003   no F/U ([place] discussed 12/03/12)  . INSERTION OF MESH N/A 07/16/2017   Procedure: INSERTION OF MESH;  Surgeon: [name], MD;  Location: [facility];  Service: General;  Laterality: N/A;  . PEG PLACEMENT  2014  . PEG TUBE REMOVAL  2014  . PROSTATE BIOPSY    . TRACHEOSTOMY  2014   was closed after hospital stay  . VENTRAL HERNIA REPAIR N/A 07/16/2017   Procedure: LAPAROSCOPIC VENTRAL HERNIA REPAIR WITH MESH;  Surgeon: [name], MD;  Location: [facility];  Service: General;  Laterality: N/A;  . wrist aspiration  02/16/12    monosodium urate crystals; Dr Caralyn Guile    Social History   Socioeconomic History  . Marital status: Married    Spouse name: [name]  . Number of children: 2  . Years of education: 65  . Highest education level: 12th grade  Occupational History  . Not on file  Tobacco Use  . Smoking status: Former Smoker    Packs/day: 0.25    Years: 1.00    Pack years: 0.25    Types: Cigarettes    Quit date: 05/15/1968    Years since quitting: 52.2  . Smokeless tobacco: Never Used  . Tobacco comment: smoked [place], up to 1-2 cigarettes/ day  Vaping Use  . Vaping Use: Never used  Substance and Sexual Activity  . Alcohol use: No    Alcohol/week: 0.0 standard drinks  . Drug use: No  . Sexual activity: Yes  Other Topics Concern  . Not on file  Social History Narrative   Walks three times a week, 2-3 miles, occasional walks on treadmill       Patient is right-handed. He lives with his wife in a one level home. He drinks 1 diet Mt. Dew a day and rarely has coffee. He walks 3 x a week for exercise.   Social  Determinants of Health   Financial Resource Strain: Low Risk   . Difficulty of Paying Living Expenses: Not very hard  Food Insecurity: Not on file  Transportation Needs: Not on file  Physical Activity: Not on file  Stress: Not on file  Social Connections: Not on file    Family History  Problem Relation Age of Onset  . Hypertension Father   . Prostate cancer Father   . Diabetes Father   .  Cancer Father   . Hypertension Mother   . Diabetes Mother   . Stroke Neg Hx   . Heart disease Neg Hx   . Colon cancer Neg Hx     Review of Systems  Constitutional: Negative for chills and fever.  Respiratory: Positive for cough (occ productive, mostly dry) and shortness of breath (sometimes with walking). Negative for wheezing.   Cardiovascular: Positive for leg swelling. Negative for chest pain and palpitations.  Gastrointestinal:       No gerd  Neurological: Positive for light-headedness (occ) and headaches (rare). Negative for dizziness.       Objective:   Vitals:   07/30/20 1052  BP: 120/80  Pulse: 69  Temp: 98.4 F (36.9 C)  SpO2: 95%   BP Readings from Last 3 Encounters:  07/30/20 120/80  06/07/20 132/78  05/10/20 (!) 191/106   Wt Readings from Last 3 Encounters:  07/30/20 184 lb (83.5 kg)  06/07/20 184 lb 6.4 oz (83.6 kg)  05/06/20 200 lb (90.7 kg)   Body mass index is 29.7 kg/m.   Physical Exam    Constitutional: Appears well-developed and well-nourished. No distress.  HENT:  Head: Normocephalic and atraumatic.  Neck: Neck supple. No tracheal deviation present. No thyromegaly present.  No cervical lymphadenopathy Cardiovascular: Normal rate, regular rhythm and normal heart sounds.   No murmur heard. No carotid bruit .  1+ bilateral lower extremity edema Pulmonary/Chest: Effort normal and breath sounds normal. No respiratory distress. No has no wheezes. No rales. Musculoskeletal: Tangerine sized soft mass anterior aspect of left wrist-possible large lipoma,  contractures of fingers 2-5 and left hand-unable to actively or passively extend Skin: Skin is warm and dry. Not diaphoretic.  Psychiatric: Normal mood and affect. Behavior is normal.      Assessment & Plan:    See Problem List for Assessment and Plan of chronic medical problems.    This visit occurred during the SARS-CoV-2 public health emergency.  Safety protocols were in place, including screening questions prior to the visit, additional usage of staff PPE, and extensive cleaning of exam room while observing appropriate contact time as indicated for disinfecting solutions.

## 2020-07-30 ENCOUNTER — Ambulatory Visit (INDEPENDENT_AMBULATORY_CARE_PROVIDER_SITE_OTHER): Payer: Medicare HMO | Admitting: Internal Medicine

## 2020-07-30 ENCOUNTER — Other Ambulatory Visit: Payer: Self-pay

## 2020-07-30 ENCOUNTER — Encounter: Payer: Self-pay | Admitting: Internal Medicine

## 2020-07-30 VITALS — BP 120/80 | HR 69 | Temp 98.4°F | Ht 66.0 in | Wt 184.0 lb

## 2020-07-30 DIAGNOSIS — M1A9XX0 Chronic gout, unspecified, without tophus (tophi): Secondary | ICD-10-CM | POA: Diagnosis not present

## 2020-07-30 DIAGNOSIS — I10 Essential (primary) hypertension: Secondary | ICD-10-CM | POA: Diagnosis not present

## 2020-07-30 DIAGNOSIS — Z794 Long term (current) use of insulin: Secondary | ICD-10-CM

## 2020-07-30 DIAGNOSIS — M24542 Contracture, left hand: Secondary | ICD-10-CM

## 2020-07-30 DIAGNOSIS — E1122 Type 2 diabetes mellitus with diabetic chronic kidney disease: Secondary | ICD-10-CM | POA: Diagnosis not present

## 2020-07-30 DIAGNOSIS — N1832 Chronic kidney disease, stage 3b: Secondary | ICD-10-CM

## 2020-07-30 DIAGNOSIS — M10072 Idiopathic gout, left ankle and foot: Secondary | ICD-10-CM

## 2020-07-30 DIAGNOSIS — M7989 Other specified soft tissue disorders: Secondary | ICD-10-CM | POA: Insufficient documentation

## 2020-07-30 MED ORDER — COLCHICINE 0.6 MG PO TABS: ORAL_TABLET | ORAL | 2 refills | Status: DC

## 2020-07-30 NOTE — Assessment & Plan Note (Signed)
Acute Fingers 2-5 on left hand-unable to extend He states this started 2-3 months ago and has been gradual on onset ?  Related to soft tissue mass anterior aspect of wrist Referred to orthopedics

## 2020-07-30 NOTE — Assessment & Plan Note (Signed)
Chronic No gout symptoms since he was here last Continue allopurinol 50 mg daily Colchicine as needed for gout flare

## 2020-07-30 NOTE — Assessment & Plan Note (Signed)
Chronic Lab Results  Component Value Date   HGBA1C 6.3 06/02/2020   Well-controlled Management per Dr. Dwyane Dee

## 2020-07-30 NOTE — Assessment & Plan Note (Signed)
Acute Anterior aspect of left wrist-feels like a lipoma ?  Causing contracture of fingers Referred to hand orthopedics

## 2020-07-30 NOTE — Assessment & Plan Note (Signed)
Chronic Recent kidney function slightly worse Blood pressure well controlled, sugars well controlled Hopefully he can continue with healthy lifestyle and we can discontinue some of his medications in the future Encouraged good fluid intake

## 2020-07-30 NOTE — Assessment & Plan Note (Signed)
Chronic Blood pressure well controlled Continue amlodipine 10 mg daily, clonidine 0.3 mg twice daily, furosemide 20 mg daily, hydralazine 75 mg 3 times daily, Imdur 60 mg daily and losartan 100 mg daily

## 2020-08-03 ENCOUNTER — Ambulatory Visit: Payer: Medicare HMO | Admitting: Podiatry

## 2020-08-09 ENCOUNTER — Telehealth: Payer: Self-pay | Admitting: Internal Medicine

## 2020-08-09 NOTE — Telephone Encounter (Signed)
LVM for pt to rtn my call to schedule AWV with [facility]. Please schedule AWV if pt calls the office  

## 2020-08-24 ENCOUNTER — Other Ambulatory Visit: Payer: Self-pay | Admitting: Internal Medicine

## 2020-08-25 ENCOUNTER — Ambulatory Visit: Payer: Medicare HMO

## 2020-09-06 ENCOUNTER — Other Ambulatory Visit: Payer: Self-pay

## 2020-09-06 ENCOUNTER — Other Ambulatory Visit (INDEPENDENT_AMBULATORY_CARE_PROVIDER_SITE_OTHER): Payer: Medicare HMO

## 2020-09-06 DIAGNOSIS — E119 Type 2 diabetes mellitus without complications: Secondary | ICD-10-CM

## 2020-09-06 LAB — BASIC METABOLIC PANEL
BUN: 30 mg/dL — ABNORMAL HIGH (ref 6–23)
CO2: 23 mEq/L (ref 19–32)
Calcium: 9.5 mg/dL (ref 8.4–10.5)
Chloride: 112 mEq/L (ref 96–112)
Creatinine, Ser: 1.68 mg/dL — ABNORMAL HIGH (ref 0.40–1.50)
GFR: 40.38 mL/min — ABNORMAL LOW (ref >60.00)
Glucose, Bld: 100 mg/dL — ABNORMAL HIGH (ref 70–99)
Potassium: 4.1 mEq/L (ref 3.5–5.1)
Sodium: 143 mEq/L (ref 135–145)

## 2020-09-06 LAB — HEMOGLOBIN A1C: Hgb A1c MFr Bld: 6.4 % (ref 4.6–6.5)

## 2020-09-07 ENCOUNTER — Other Ambulatory Visit: Payer: Self-pay | Admitting: Endocrinology

## 2020-09-07 DIAGNOSIS — E1165 Type 2 diabetes mellitus with hyperglycemia: Secondary | ICD-10-CM

## 2020-09-09 ENCOUNTER — Encounter: Payer: Self-pay | Admitting: Endocrinology

## 2020-09-09 ENCOUNTER — Ambulatory Visit: Payer: Medicare HMO | Admitting: Endocrinology

## 2020-09-09 ENCOUNTER — Other Ambulatory Visit: Payer: Self-pay

## 2020-09-09 VITALS — BP 142/84 | HR 69 | Ht 66.0 in | Wt 184.2 lb

## 2020-09-09 DIAGNOSIS — E119 Type 2 diabetes mellitus without complications: Secondary | ICD-10-CM

## 2020-09-09 DIAGNOSIS — N189 Chronic kidney disease, unspecified: Secondary | ICD-10-CM

## 2020-09-09 DIAGNOSIS — I10 Essential (primary) hypertension: Secondary | ICD-10-CM | POA: Diagnosis not present

## 2020-09-09 NOTE — Progress Notes (Signed)
Patient ID: Corey [name], male   DOB: 10-18-1945, 75 y.o.   MRN: [id number]   Reason for Appointment : Followup of various problems  History of Present Illness          Diagnosis: Type 2 diabetes mellitus, date of diagnosis: 1997      Past history: The patient was initially treated with various oral hypoglycemic drugs and details are not available. About 2009 he was started on insulin and has either taken premixed insulin or Lantus once a day He had difficulty affording his Lantus insulin and had preferred to take premixed insulin Subsequently insulin was tapered off  Recent history:    Oral hypoglycemic drugs: Metformin ER 500 mg, 1 tablet daily, Amaryl 0.5 mg at bedtime  Current blood sugar patterns and problems:  His A1c is down to 6.4  Previous fructosamine was 320    His blood sugars are usually controlled except 1 isolated reading of 242  Although his morning sugars are relatively higher at home has meter may be inaccurate lab glucose 100 he has home glucose was 126  Postprandial readings appear to be fairly good except when he gets into candy  Metformin 500 mg was reduce to once a day on the last visit  Also no hypoglycemia with low-dose Amaryl  He is not motivated to start his walking program again  Weight is about the same   Glucose monitoring:  done less than 1 time a day         Glucometer:  Accu-Chek       Blood Glucose readings from download:    PRE-MEAL Fasting Lunch Dinner Bedtime Overall  Glucose range:  102-148      Mean/median:  135    136   POST-MEAL PC Breakfast PC Lunch PC Dinner  Glucose range:  130, 242  125  101, 151  Mean/median:         Self-care:       Meals: 2-3 meals per day. Egg in am at 9-10 am or grits, rarely sandwich at lunch. His largest meal is at suppertime at about 8 pm.         Dietician visit: Most recent: 02/2014               Compliance with the medical regimen:  Fair.    Wt Readings from Last 3  Encounters:  09/09/20 184 lb 3.2 oz (83.6 kg)  07/30/20 184 lb (83.5 kg)  06/07/20 184 lb 6.4 oz (83.6 kg)    Lab Results  Component Value Date   HGBA1C 6.4 09/06/2020   HGBA1C 6.3 06/02/2020   HGBA1C 6.7 (H) 03/01/2020    Lab Results  Component Value Date   MICROALBUR 2.4 (H) 03/03/2020   LDLCALC 61 01/28/2020   CREATININE 1.68 (H) 09/06/2020   Lab on 09/06/2020  Component Date Value Ref Range Status  . Sodium 09/06/2020 143  135 - 145 mEq/L Final  . Potassium 09/06/2020 4.1  3.5 - 5.1 mEq/L Final  . Chloride 09/06/2020 112  96 - 112 mEq/L Final  . CO2 09/06/2020 23  19 - 32 mEq/L Final  . Glucose, Bld 09/06/2020 100* 70 - 99 mg/dL Final  . BUN 09/06/2020 30* 6 - 23 mg/dL Final  . Creatinine, Ser 09/06/2020 1.68* 0.40 - 1.50 mg/dL Final  . GFR 09/06/2020 40.38* >60.00 mL/min Final   Calculated using the CKD-EPI Creatinine Equation (2021)  . Calcium 09/06/2020 9.5  8.4 - 10.5 mg/dL Final  .  Hgb A1c MFr Bld 09/06/2020 6.4  4.6 - 6.5 % Final   Glycemic Control Guidelines for People with Diabetes:Non Diabetic:  <6%Goal of Therapy: <7%Additional Action Suggested:  >8%    Other active medical problems addressed: See review of systems   Allergies as of 09/09/2020      Reactions   Hydrochlorothiazide Other (See Comments)   Gout , uncontrolled diabetes and renal insufficiency      Medication List       Accurate as of September 09, 2020  2:49 PM. If you have any questions, ask your nurse or doctor.        albuterol 108 (90 Base) MCG/ACT inhaler Commonly known as: VENTOLIN HFA Inhale 2 puffs into the lungs every 4 (four) hours as needed for wheezing or shortness of breath.   allopurinol 100 MG tablet Commonly known as: ZYLOPRIM Take 0.5 tablets (50 mg total) by mouth daily. What changed:   when to take this  reasons to take this   amLODipine 10 MG tablet Commonly known as: NORVASC Take 1 tablet (10 mg total) by mouth daily.   aspirin EC 81 MG tablet Take 1 tablet  (81 mg total) by mouth daily.   cloNIDine 0.3 MG tablet Commonly known as: CATAPRES TAKE 1 TABLET TWICE DAILY   colchicine 0.6 MG tablet Commonly known as: Colcrys Take 2 tabs once and then one tab one hour later as needed for gout   furosemide 20 MG tablet Commonly known as: LASIX TAKE 1 TABLET EVERY MORNING   gabapentin 300 MG capsule Commonly known as: Neurontin Take 1 capsule (300 mg total) by mouth at bedtime.   glimepiride 1 MG tablet Commonly known as: AMARYL TAKE 1/2 TABLET AT BEDTIME   hydrALAZINE 50 MG tablet Commonly known as: APRESOLINE TAKE 1 AND 1/2 TABLETS THREE TIMES DAILY What changed: See the new instructions.   isosorbide mononitrate 60 MG 24 hr tablet Commonly known as: IMDUR TAKE 1 TABLET TWICE DAILY (FOLLOW-UP APPOINTMENT IS DUE. MUST SEE PROVIDER FOR FUTURE REFILLS) What changed: See the new instructions.   losartan 100 MG tablet Commonly known as: COZAAR TAKE 1 TABLET EVERY DAY   metFORMIN 500 MG tablet Commonly known as: GLUCOPHAGE Take 1 tablet (500 mg total) by mouth 2 (two) times daily with a meal.       Allergies:  Allergies  Allergen Reactions  . Hydrochlorothiazide Other (See Comments)    Gout , uncontrolled diabetes and renal insufficiency    Past Medical History:  Diagnosis Date  . Cataract   . CHF (congestive heart failure) ([place])   . Colon polyp 2003   Dr [name]; F/U was to be 2008( not completed)  . CVA (cerebral infarction) 2014  . Diabetes mellitus   . Diverticulosis 2003  . Dyspnea    with exertion  . Gout   . Hypertension   . Myocardial infarction ([place]) 2014  . OSA (obstructive sleep apnea) 03/27/2018  . Pneumonia 09/29/11   Avelox X 10 days as OP  . Prostate cancer ([place])   . Renal insufficiency   . Seizures ([place])    not treated for seizure disorder; had a seizure after stroke 2014; no seizure since then    Past Surgical History:  Procedure Laterality Date  . COLONOSCOPY W/ POLYPECTOMY  2003   no  F/U ([place] discussed 12/03/12)  . INSERTION OF MESH N/A 07/16/2017   Procedure: INSERTION OF MESH;  Surgeon: [name], MD;  Location: [facility];  Service: General;  Laterality: N/A;  . PEG PLACEMENT  2014  . PEG TUBE REMOVAL  2014  . PROSTATE BIOPSY    . TRACHEOSTOMY  2014   was closed after hospital stay  . VENTRAL HERNIA REPAIR N/A 07/16/2017   Procedure: LAPAROSCOPIC VENTRAL HERNIA REPAIR WITH MESH;  Surgeon: [name], MD;  [facility];  Service: General;  Laterality: N/A;  . wrist aspiration  02/16/12    monosodium urate crystals; Dr Caralyn Guile    Family History  Problem Relation Age of Onset  . Hypertension Father   . Prostate cancer Father   . Diabetes Father   . Cancer Father   . Hypertension Mother   . Diabetes Mother   . Stroke Neg Hx   . Heart disease Neg Hx   . Colon cancer Neg Hx     Social History:  reports that he quit smoking about 52 years ago. His smoking use included cigarettes. He has a 0.25 pack-year smoking history. He has never used smokeless tobacco. He reports that he does not drink alcohol and does not use drugs.    Review of Systems   Lipids: Has normal lipids, not on a statin drug  Lab Results  Component Value Date   CHOL 125 01/28/2020   HDL 50 01/28/2020   LDLCALC 61 01/28/2020   TRIG 55 01/28/2020   CHOLHDL 2.5 01/28/2020    Eye exams: Last done in 1/20 without retinopathy     He has renal insufficiency of unclear etiology with variable creatinine levels as follows: Creatinine is slightly  , previously may have improved with reducing losartan  Lab Results  Component Value Date   CREATININE 1.68 (H) 09/06/2020   CREATININE 1.87 (H) 06/02/2020   CREATININE 1.77 (H) 05/06/2020     HYPERTENSION: on losartan 50 mg, amlodipine , clonidine 0.3 mg and hydralazine as well as metoprolol  Currently managed by PCP and cardiologist  Monitors blood pressure at home  He also takes Lasix for edema   BP Readings from Last 3  Encounters:  09/09/20 (!) 142/84  07/30/20 120/80  06/07/20 132/78    Foot exam done in 3/20 with PCP  His wife thinks that he may get short of breath Also she thinks that he tends to sleep a lot  Physical Examination:  BP (!) 142/84   Pulse 69   Ht 5\' 6"  (1.676 m)   Wt 184 lb 3.2 oz (83.6 kg)   SpO2 96%   BMI 29.73 kg/m   Heart rhythm is regular with occasional ectopics Repeat blood pressure about the same   ASSESSMENT/PLAN:  Diabetes type 2, uncontrolled   See history of present illness for discussion of current diabetes management, blood sugar patterns and problems identified  A1c is 6.4  He is on metformin 500 mg once daily and 0.5 mg Amaryl at bedtime Blood sugars are generally well controlled even at home Is checking blood sugars about every other day with fairly good readings However as discussed above his home meter may be inaccurate since he has an old Accu-Chek [place] He can however do better with walking for exercise  He will continue the same regimen including 500 mg metformin  HYPERTENSION: He has fair control Needs to follow-up with his cardiologist and PCP  Also likely is getting some somnolence from high-dose clonidine and he will discuss this with his cardiologist or PCP   Renal dysfunction: Creatinine is stable, likely needs to be followed by nephrologist  Follow-up in 4 months  Patient Instructions  Check blood sugars on waking up 3 days a week  Also check blood sugars about 2 hours after meals and do this after different meals by rotation  Recommended blood sugar levels on waking up are 90-130 and about 2 hours after meal is 130-160  Please bring your blood sugar monitor to each visit, thank you      Elayne Snare 09/09/2020, 2:49 PM

## 2020-09-09 NOTE — Patient Instructions (Signed)
Check blood sugars on waking up 3 days a week  Also check blood sugars about 2 hours after meals and do this after different meals by rotation  Recommended blood sugar levels on waking up are 90-130 and about 2 hours after meal is 130-160  Please bring your blood sugar monitor to each visit, thank you   

## 2020-09-10 ENCOUNTER — Ambulatory Visit: Payer: Medicare HMO

## 2020-09-14 ENCOUNTER — Other Ambulatory Visit: Payer: Self-pay

## 2020-09-14 NOTE — Patient Outreach (Signed)
[facility]) Care Management  09/14/2020  Corey [name] 1945/08/28 [id number]   Telephone Assessment Quarterly Call   Unsuccessful outreach attempt to patient. No answer after multiple rings and unable to leave message.       Plan: RN CM will make quarterly outreach attempt to patient within the month of August.   Liandro Thelin Ver[facility] [place] [facility] Management Telephonic Care Management Coordinator Direct Phone: [phone] Toll Free: [phone] Fax: [phone]

## 2020-09-15 ENCOUNTER — Other Ambulatory Visit: Payer: Self-pay | Admitting: Internal Medicine

## 2020-09-15 ENCOUNTER — Other Ambulatory Visit: Payer: Self-pay

## 2020-09-15 ENCOUNTER — Encounter: Payer: Self-pay | Admitting: Cardiology

## 2020-09-15 ENCOUNTER — Ambulatory Visit: Payer: Medicare HMO | Admitting: Cardiology

## 2020-09-15 VITALS — BP 130/80 | HR 69 | Ht 66.0 in | Wt 187.0 lb

## 2020-09-15 DIAGNOSIS — R0609 Other forms of dyspnea: Secondary | ICD-10-CM

## 2020-09-15 DIAGNOSIS — I313 Pericardial effusion (noninflammatory): Secondary | ICD-10-CM

## 2020-09-15 DIAGNOSIS — Z01812 Encounter for preprocedural laboratory examination: Secondary | ICD-10-CM | POA: Diagnosis not present

## 2020-09-15 DIAGNOSIS — R0600 Dyspnea, unspecified: Secondary | ICD-10-CM

## 2020-09-15 DIAGNOSIS — I3139 Other pericardial effusion (noninflammatory): Secondary | ICD-10-CM

## 2020-09-15 LAB — CBC
Hematocrit: 35 % — ABNORMAL LOW (ref 37.5–51.0)
Hemoglobin: 11.7 g/dL — ABNORMAL LOW (ref 13.0–17.7)
MCH: 27.1 pg (ref 26.6–33.0)
MCHC: 33.4 g/dL (ref 31.5–35.7)
MCV: 81 fL (ref 79–97)
Platelets: 185 10*3/uL (ref 150–450)
RBC: 4.31 x10E6/uL (ref 4.14–5.80)
RDW: 14.2 % (ref 11.6–15.4)
WBC: 2.9 10*3/uL — ABNORMAL LOW (ref 3.4–10.8)

## 2020-09-15 LAB — BASIC METABOLIC PANEL
BUN/Creatinine Ratio: 18 (ref 10–24)
BUN: 31 mg/dL — ABNORMAL HIGH (ref 8–27)
CO2: 22 mmol/L (ref 20–29)
Calcium: 9.9 mg/dL (ref 8.6–10.2)
Chloride: 113 mmol/L — ABNORMAL HIGH (ref 96–106)
Creatinine, Ser: 1.72 mg/dL — ABNORMAL HIGH (ref 0.76–1.27)
Glucose: 108 mg/dL — ABNORMAL HIGH (ref 65–99)
Potassium: 4.3 mmol/L (ref 3.5–5.2)
Sodium: 144 mmol/L (ref 134–144)
eGFR: 42 mL/min/{1.73_m2} — ABNORMAL LOW (ref >59)

## 2020-09-15 NOTE — Progress Notes (Signed)
Cardiology Office Note:    Date:  09/15/2020   ID:  ZAYDIN BILLEY, DOB 1945-12-11, MRN [id number]  PCP:  [name], MD   [facility] Providers Cardiologist:  Candee Furbish, MD     Referring MD: [name], MD    History of Present Illness:    Corey [name] is a 75 y.o. male here for the follow up of chronic pericardial effusion, shortness of breath, lower extremity edema, and sinus bradycardia. Previous [facility] hospital visit for infection and hypertensive urgency. He is accompanied by his wife today. He reports that he is not feeling well. He is experiencing shortness of breath upon exertion.  He notes that his weight has been stabled(187 lbs). His wife reports that he has been taking short breaths that has progressively worsened. He does not have a smoking history. He denies having any exertional chest pain, tightness, or pressure. He denies any lightheadedness, syncopal episodes, orthopnea, or PND.   Past Medical History:  Diagnosis Date  . Cataract   . CHF (congestive heart failure) ([place])   . Colon polyp 2003   Dr [name]; F/U was to be 2008( not completed)  . CVA (cerebral infarction) 2014  . Diabetes mellitus   . Diverticulosis 2003  . Dyspnea    with exertion  . Gout   . Hypertension   . Myocardial infarction ([place]) 2014  . OSA (obstructive sleep apnea) 03/27/2018  . Pneumonia 09/29/11   Avelox X 10 days as OP  . Prostate cancer ([place])   . Renal insufficiency   . Seizures ([place])    not treated for seizure disorder; had a seizure after stroke 2014; no seizure since then    Past Surgical History:  Procedure Laterality Date  . COLONOSCOPY W/ POLYPECTOMY  2003   no F/U ([place] discussed 12/03/12)  . INSERTION OF MESH N/A 07/16/2017   Procedure: INSERTION OF MESH;  Surgeon: [name], MD;  Location: [place];  Service: General;  Laterality: N/A;  . PEG PLACEMENT  2014  . PEG TUBE REMOVAL  2014  . PROSTATE BIOPSY    . TRACHEOSTOMY  2014   was closed after hospital  stay  . VENTRAL HERNIA REPAIR N/A 07/16/2017   Procedure: LAPAROSCOPIC VENTRAL HERNIA REPAIR WITH MESH;  Surgeon: [name], MD;  Location: [place];  Service: General;  Laterality: N/A;  . wrist aspiration  02/16/12    monosodium urate crystals; Dr Caralyn Guile    Current Medications: Current Meds  Medication Sig  . albuterol (VENTOLIN HFA) 108 (90 Base) MCG/ACT inhaler Inhale 2 puffs into the lungs every 4 (four) hours as needed for wheezing or shortness of breath.  . allopurinol (ZYLOPRIM) 100 MG tablet Take 0.5 tablets (50 mg total) by mouth daily.  Marland Kitchen amLODipine (NORVASC) 10 MG tablet Take 1 tablet (10 mg total) by mouth daily.  Marland Kitchen aspirin EC 81 MG tablet Take 1 tablet (81 mg total) by mouth daily.  . cloNIDine (CATAPRES) 0.3 MG tablet TAKE 1 TABLET TWICE DAILY  . colchicine (COLCRYS) 0.6 MG tablet Take 2 tabs once and then one tab one hour later as needed for gout  . furosemide (LASIX) 20 MG tablet TAKE 1 TABLET EVERY MORNING  . gabapentin (NEURONTIN) 300 MG capsule Take 1 capsule (300 mg total) by mouth at bedtime.  Marland Kitchen glimepiride (AMARYL) 1 MG tablet TAKE 1/2 TABLET AT BEDTIME  . hydrALAZINE (APRESOLINE) 50 MG tablet TAKE 1 AND 1/2 TABLETS THREE TIMES DAILY  .  isosorbide mononitrate (IMDUR) 60 MG 24 hr tablet TAKE 1 TABLET TWICE DAILY (FOLLOW-UP APPOINTMENT IS DUE. MUST SEE PROVIDER FOR FUTURE REFILLS)  . losartan (COZAAR) 100 MG tablet TAKE 1 TABLET EVERY DAY  . metFORMIN (GLUCOPHAGE) 500 MG tablet Take 1 tablet (500 mg total) by mouth 2 (two) times daily with a meal. (Patient taking differently: Take 500 mg by mouth at bedtime.)     Allergies:   Hydrochlorothiazide   Social History   Socioeconomic History  . Marital status: Married    Spouse name: [name]  . Number of children: 2  . Years of education: 46  . Highest education level: 12th grade  Occupational History  . Not on file  Tobacco Use  . Smoking status: Former Smoker    Packs/day: 0.25    Years: 1.00    Pack  years: 0.25    Types: Cigarettes    Quit date: 05/15/1968    Years since quitting: 52.3  . Smokeless tobacco: Never Used  . Tobacco comment: smoked [place], up to 1-2 cigarettes/ day  Vaping Use  . Vaping Use: Never used  Substance and Sexual Activity  . Alcohol use: No    Alcohol/week: 0.0 standard drinks  . Drug use: No  . Sexual activity: Yes  Other Topics Concern  . Not on file  Social History Narrative   Walks three times a week, 2-3 miles, occasional walks on treadmill       Patient is right-handed. He lives with his wife in a one level home. He drinks 1 diet Mt. Dew a day and rarely has coffee. He walks 3 x a week for exercise.   Social Determinants of Health   Financial Resource Strain: Low Risk   . Difficulty of Paying Living Expenses: Not very hard  Food Insecurity: Not on file  Transportation Needs: Not on file  Physical Activity: Not on file  Stress: Not on file  Social Connections: Not on file     Family History: The patient's family history includes Cancer in his father; Diabetes in his father and mother; Hypertension in his father and mother; Prostate cancer in his father. There is no history of Stroke, Heart disease, or Colon cancer.  ROS:   Please see the history of present illness. (+)Shortness of breath    All other systems reviewed and are negative.  EKGs/Labs/Other Studies Reviewed:    The following studies were reviewed today: ECHO(10/10/19): 1. Left ventricular ejection fraction, by estimation, is 60 to 65%. The left ventricle has normal function. The left ventricle has no regional wall motion abnormalities. There is severe left ventricular hypertrophy. Left ventricular diastolic parameters are consistent with Grade II diastolic dysfunction (pseudonormalization). Elevated left atrial pressure. 2. Right ventricular systolic function is normal. The right ventricular size is mildly enlarged. There is mildly elevated pulmonary artery systolic  pressure. 3. Left atrial size was severely dilated. 4. Right atrial size was moderately dilated. 5. 1.5 to 2.0cm pericardial effusion. Moderate pericardial effusion. The pericardial effusion is circumferential. There is no evidence of cardiac tamponade. 6. The mitral valve is normal in structure. Trivial mitral valve regurgitation. No evidence of mitral stenosis. 7. The aortic valve is normal in structure. Aortic valve regurgitation is not visualized. No aortic stenosis is present. 8. There is mild dilatation of the ascending aorta measuring 40 mm. 9. The inferior vena cava is dilated in size with >50% respiratory variability, suggesting right atrial pressure of 8 mmHg. Comparison(s):  When compared to 2019, pericardial effusion  is slightly worse (but was present 2 years ago and appears chronic).   Chest x-ray 05/06/2020:  IMPRESSION: Mild left midlung atelectasis. Lungs elsewhere clear. Heart remains diffusely enlarged. Pulmonary vascularity within normal limits. No adenopathy appreciable.  EKG:   09/16/19- Sinus rhythm, Rate: 69 bpm, poor R wave progression, LAFB.   Recent Labs: 04/07/2020: ALT 21 05/06/2020: B Natriuretic Peptide 1,524.0 09/15/2020: BUN WILL FOLLOW; Creatinine, Ser WILL FOLLOW; Hemoglobin 11.7; Platelets 185; Potassium WILL FOLLOW; Sodium WILL FOLLOW  Recent Lipid Panel    Component Value Date/Time   CHOL 125 01/28/2020 1000   TRIG 55 01/28/2020 1000   HDL 50 01/28/2020 1000   CHOLHDL 2.5 01/28/2020 1000   VLDL 12.0 04/30/2019 1144   LDLCALC 61 01/28/2020 1000           Physical Exam:    VS:  BP 130/80 (BP Location: Left Arm, Patient Position: Sitting, Cuff Size: Normal)   Pulse 69   Ht 5\' 6"  (1.676 m)   Wt 187 lb (84.8 kg)   SpO2 95%   BMI 30.18 kg/m     Wt Readings from Last 3 Encounters:  09/15/20 187 lb (84.8 kg)  09/09/20 184 lb 3.2 oz (83.6 kg)  07/30/20 184 lb (83.5 kg)     GEN: Well nourished, well developed in no acute  distress HEENT: Normal NECK: No JVD; No carotid bruits LYMPHATICS: No lymphadenopathy CARDIAC: RRR, no murmurs, rubs, gallops RESPIRATORY:  Clear to auscultation without rales, wheezing or rhonchi  ABDOMEN: Soft, non-tender, non-distended MUSCULOSKELETAL: +2-3 pitting edema(R>L); No deformity  SKIN: Warm and dry NEUROLOGIC:  Alert and oriented x 3 PSYCHIATRIC:  Normal affect   ASSESSMENT:    1. DOE (dyspnea on exertion)   2. Pericardial effusion   3. Pre-procedure lab exam    PLAN:    In order of problems listed above:  Exertional dyspnea - This could be his anginal equivalent.  Since it is continuing to get worse, I would like to go ahead and proceed with a right and left heart catheterization.  This will also help to understand his hemodynamics, cardiac output as well as any evidence of pericardial effusion influencing his cardiac function. - Since his renal function is slightly hampered with chronic kidney disease stage IIIb, creatinine 1.68 previously, we will bring him in 1 hour prior for gentle hydration. - Continue with low-dose Lasix for now.  Weight has been stable.  Chronic pericardial effusion - Has been noted on multiple echocardiograms.  Checking a right and left heart catheterization.  Question constriction.  Would like to have some guidance on whether or not removal of fluid would be beneficial for him.  CKD stage IIIb - Avoid NSAIDs.  Gentle hydration prior to cath.  Chronic diastolic heart failure - Shortness of breath noted with activity.  Wife is concerned.  Shared Decision Making/Informed Consent The risks [stroke (1 in 1000), death (1 in 1000), kidney failure [usually temporary] (1 in 500), bleeding (1 in 200), allergic reaction [possibly serious] (1 in 200)], benefits (diagnostic support and management of coronary artery disease) and alternatives of a cardiac catheterization were discussed in detail with Mr. Rea and he is willing to proceed.         Medication Adjustments/Labs and Tests Ordered: Current medicines are reviewed at length with the patient today.  Concerns regarding medicines are outlined above.  Orders Placed This Encounter  Procedures  . CBC  . Basic metabolic panel  . EKG 12-Lead   No orders of the  defined types were placed in this encounter.   Patient Instructions  Medication Instructions:  The current medical regimen is effective;  continue present plan and medications.  *If you need a refill on your cardiac medications before your next appointment, please call your pharmacy*  Lab Work: Please have blood work today (CBC, BMP)  If you have labs (blood work) drawn today and your tests are completely normal, you will receive your results only by: Marland Kitchen MyChart Message (if you have MyChart) OR . A paper copy in the mail If you have any lab test that is abnormal or we need to change your treatment, we will call you to review the results.   Testing/Procedures:    [place] OFFICE [place], [address] [place] [address] Dept: [phone] Loc: [place]  09/15/2020  You are scheduled for a cardiac cath on Monday, May 9 with Dr. Ellyn Hack.  1. Please arrive at the [facility] (Main Entrance A) at [facility]: [address] [place], [place] [address] at 7 AM (This time is two hours before your procedure to ensure your preparation). Free valet parking service is available.   Special note: Every effort is made to have your procedure done on time. Please understand that emergencies sometimes delay scheduled procedures.  2. Diet: Do not eat solid foods after midnight.  The patient may have clear liquids until 5am upon the day of the procedure.  3. Labs: You will need to have blood drawn today: CBC/BMP   Have Covid screening as scheduled Friday.  4. Medication instructions in preparation for your  procedure:  The morning of your procedure please do not take your Metformin, Glimepiride or Furosemide.  You may take your other medications with sips of water unless otherwise instructed.  5. Plan for one night stay--bring personal belongings. 6. Bring a current list of your medications and current insurance cards. 7. You MUST have a responsible person to drive you home. 8. Someone MUST be with you the first 24 hours after you arrive home or your discharge will be delayed. 9. Please wear clothes that are easy to get on and off and wear slip-on shoes.  Thank you for allowing [place] to care for you!   -- [place] [facility]   Follow-Up: At [facility], you and your health needs are our priority.  As part of our continuing mission to provide you with exceptional heart care, we have created designated Provider Care Teams.  These Care Teams include your primary Cardiologist (physician) and Advanced Practice Providers (APPs -  Physician Assistants and Nurse Practitioners) who all work together to provide you with the care you need, when you need it.  We recommend signing up for the patient portal called "MyChart".  Sign up information is provided on this After Visit Summary.  MyChart is used to connect with patients for Virtual Visits (Telemedicine).  Patients are able to view lab/test results, encounter notes, upcoming appointments, etc.  Non-urgent messages can be sent to your provider as well.   To learn more about what you can do with MyChart, go to [email or web address].    Your next appointment:   2 month(s)  The format for your next appointment:   In Person  Provider:   You may see Candee Furbish, MD or one of the following Advanced Practice Providers on your designated Care Team:    Kathyrn Drown, NP  Thank you for choosing [facility]!!          I,Alexis Bryant,acting as a scribe for [employer], MD.,have documented all relevant  documentation on the behalf of Candee Furbish, MD,as directed by  Candee Furbish, MD while in the presence of Candee Furbish, MD.  I, Candee Furbish, MD, have reviewed all documentation for this visit. The documentation on 09/15/20 for the exam, diagnosis, procedures, and orders are all accurate and complete.   Signed, Candee Furbish, MD  09/15/2020 3:07 PM    [facility]

## 2020-09-15 NOTE — Patient Instructions (Addendum)
Medication Instructions:  The current medical regimen is effective;  continue present plan and medications.  *If you need a refill on your cardiac medications before your next appointment, please call your pharmacy*  Lab Work: Please have blood work today (CBC, BMP)  If you have labs (blood work) drawn today and your tests are completely normal, you will receive your results only by: Marland Kitchen MyChart Message (if you have MyChart) OR . A paper copy in the mail If you have any lab test that is abnormal or we need to change your treatment, we will call you to review the results.   Testing/Procedures:    [place] OFFICE [place], [address] [place] [address] Dept: [phone] Loc: [place]  09/15/2020  You are scheduled for a cardiac cath on Monday, May 9 with Dr. Ellyn Hack.  1. Please arrive at the [facility] (Main Entrance A) at [facility]: [address] [place], [place] [address] at 7 AM (This time is two hours before your procedure to ensure your preparation). Free valet parking service is available.   Special note: Every effort is made to have your procedure done on time. Please understand that emergencies sometimes delay scheduled procedures.  2. Diet: Do not eat solid foods after midnight.  The patient may have clear liquids until 5am upon the day of the procedure.  3. Labs: You will need to have blood drawn today: CBC/BMP   Have Covid screening as scheduled Friday.  4. Medication instructions in preparation for your procedure:  The morning of your procedure please do not take your Metformin, Glimepiride or Furosemide.  You may take your other medications with sips of water unless otherwise instructed.  5. Plan for one night stay--bring personal belongings. 6. Bring a current list of your medications and current insurance cards. 7. You MUST have a responsible  person to drive you home. 8. Someone MUST be with you the first 24 hours after you arrive home or your discharge will be delayed. 9. Please wear clothes that are easy to get on and off and wear slip-on shoes.  Thank you for allowing [place] to care for you!   -- [place] [facility]   Follow-Up: At [facility], you and your health needs are our priority.  As part of our continuing mission to provide you with exceptional heart care, we have created designated Provider Care Teams.  These Care Teams include your primary Cardiologist (physician) and Advanced Practice Providers (APPs -  Physician Assistants and Nurse Practitioners) who all work together to provide you with the care you need, when you need it.  We recommend signing up for the patient portal called "MyChart".  Sign up information is provided on this After Visit Summary.  MyChart is used to connect with patients for Virtual Visits (Telemedicine).  Patients are able to view lab/test results, encounter notes, upcoming appointments, etc.  Non-urgent messages can be sent to your provider as well.   To learn more about what you can do with MyChart, go to [email or web address].    Your next appointment:   2 month(s)  The format for your next appointment:   In Person  Provider:   You may see Candee Furbish, MD or one of the following Advanced Practice Providers on your designated Care Team:    Kathyrn Drown, NP    Thank you for choosing [facility]!!

## 2020-09-15 NOTE — H&P (View-Only) (Signed)
Cardiology Office Note:    Date:  09/15/2020   ID:  Corey [name], DOB 1946-03-13, MRN [id number]  PCP:  [name], MD   [facility] Providers Cardiologist:  Candee Furbish, MD     Referring MD: [name], MD    History of Present Illness:    Corey [name] is a 75 y.o. male here for the follow up of chronic pericardial effusion, shortness of breath, lower extremity edema, and sinus bradycardia. Previous PEA hospital visit for infection and hypertensive urgency. He is accompanied by his wife today. He reports that he is not feeling well. He is experiencing shortness of breath upon exertion.  He notes that his weight has been stabled(187 lbs). His wife reports that he has been taking short breaths that has progressively worsened. He does not have a smoking history. He denies having any exertional chest pain, tightness, or pressure. He denies any lightheadedness, syncopal episodes, orthopnea, or PND.   Past Medical History:  Diagnosis Date  . Cataract   . CHF (congestive heart failure) ([place])   . Colon polyp 2003   Dr [name]; F/U was to be 2008( not completed)  . CVA (cerebral infarction) 2014  . Diabetes mellitus   . Diverticulosis 2003  . Dyspnea    with exertion  . Gout   . Hypertension   . Myocardial infarction ([place]) 2014  . OSA (obstructive sleep apnea) 03/27/2018  . Pneumonia 09/29/11   Avelox X 10 days as OP  . Prostate cancer ([place])   . Renal insufficiency   . Seizures ([place])    not treated for seizure disorder; had a seizure after stroke 2014; no seizure since then    Past Surgical History:  Procedure Laterality Date  . COLONOSCOPY W/ POLYPECTOMY  2003   no F/U ([place] discussed 12/03/12)  . INSERTION OF MESH N/A 07/16/2017   Procedure: INSERTION OF MESH;  Surgeon: [name], MD;  Location: [place];  Service: General;  Laterality: N/A;  . PEG PLACEMENT  2014  . PEG TUBE REMOVAL  2014  . PROSTATE BIOPSY    . TRACHEOSTOMY  2014   was closed after hospital  stay  . VENTRAL HERNIA REPAIR N/A 07/16/2017   Procedure: LAPAROSCOPIC VENTRAL HERNIA REPAIR WITH MESH;  Surgeon: [name], MD;  Location: [place];  Service: General;  Laterality: N/A;  . wrist aspiration  02/16/12    monosodium urate crystals; Dr Caralyn Guile    Current Medications: Current Meds  Medication Sig  . albuterol (VENTOLIN HFA) 108 (90 Base) MCG/ACT inhaler Inhale 2 puffs into the lungs every 4 (four) hours as needed for wheezing or shortness of breath.  . allopurinol (ZYLOPRIM) 100 MG tablet Take 0.5 tablets (50 mg total) by mouth daily.  Marland Kitchen amLODipine (NORVASC) 10 MG tablet Take 1 tablet (10 mg total) by mouth daily.  Marland Kitchen aspirin EC 81 MG tablet Take 1 tablet (81 mg total) by mouth daily.  . cloNIDine (CATAPRES) 0.3 MG tablet TAKE 1 TABLET TWICE DAILY  . colchicine (COLCRYS) 0.6 MG tablet Take 2 tabs once and then one tab one hour later as needed for gout  . furosemide (LASIX) 20 MG tablet TAKE 1 TABLET EVERY MORNING  . gabapentin (NEURONTIN) 300 MG capsule Take 1 capsule (300 mg total) by mouth at bedtime.  Marland Kitchen glimepiride (AMARYL) 1 MG tablet TAKE 1/2 TABLET AT BEDTIME  . hydrALAZINE (APRESOLINE) 50 MG tablet TAKE 1 AND 1/2 TABLETS THREE TIMES DAILY  .  isosorbide mononitrate (IMDUR) 60 MG 24 hr tablet TAKE 1 TABLET TWICE DAILY (FOLLOW-UP APPOINTMENT IS DUE. MUST SEE PROVIDER FOR FUTURE REFILLS)  . losartan (COZAAR) 100 MG tablet TAKE 1 TABLET EVERY DAY  . metFORMIN (GLUCOPHAGE) 500 MG tablet Take 1 tablet (500 mg total) by mouth 2 (two) times daily with a meal. (Patient taking differently: Take 500 mg by mouth at bedtime.)     Allergies:   Hydrochlorothiazide   Social History   Socioeconomic History  . Marital status: Married    Spouse name: [name]  . Number of children: 2  . Years of education: 56  . Highest education level: 12th grade  Occupational History  . Not on file  Tobacco Use  . Smoking status: Former Smoker    Packs/day: 0.25    Years: 1.00    Pack  years: 0.25    Types: Cigarettes    Quit date: 05/15/1968    Years since quitting: 52.3  . Smokeless tobacco: Never Used  . Tobacco comment: smoked [place], up to 1-2 cigarettes/ day  Vaping Use  . Vaping Use: Never used  Substance and Sexual Activity  . Alcohol use: No    Alcohol/week: 0.0 standard drinks  . Drug use: No  . Sexual activity: Yes  Other Topics Concern  . Not on file  Social History Narrative   Walks three times a week, 2-3 miles, occasional walks on treadmill       Patient is right-handed. He lives with his wife in a one level home. He drinks 1 diet Mt. Dew a day and rarely has coffee. He walks 3 x a week for exercise.   Social Determinants of Health   Financial Resource Strain: Low Risk   . Difficulty of Paying Living Expenses: Not very hard  Food Insecurity: Not on file  Transportation Needs: Not on file  Physical Activity: Not on file  Stress: Not on file  Social Connections: Not on file     Family History: The patient's family history includes Cancer in his father; Diabetes in his father and mother; Hypertension in his father and mother; Prostate cancer in his father. There is no history of Stroke, Heart disease, or Colon cancer.  ROS:   Please see the history of present illness. (+)Shortness of breath    All other systems reviewed and are negative.  EKGs/Labs/Other Studies Reviewed:    The following studies were reviewed today: ECHO(10/10/19): 1. Left ventricular ejection fraction, by estimation, is 60 to 65%. The left ventricle has normal function. The left ventricle has no regional wall motion abnormalities. There is severe left ventricular hypertrophy. Left ventricular diastolic parameters are consistent with Grade II diastolic dysfunction (pseudonormalization). Elevated left atrial pressure. 2. Right ventricular systolic function is normal. The right ventricular size is mildly enlarged. There is mildly elevated pulmonary artery systolic  pressure. 3. Left atrial size was severely dilated. 4. Right atrial size was moderately dilated. 5. 1.5 to 2.0cm pericardial effusion. Moderate pericardial effusion. The pericardial effusion is circumferential. There is no evidence of cardiac tamponade. 6. The mitral valve is normal in structure. Trivial mitral valve regurgitation. No evidence of mitral stenosis. 7. The aortic valve is normal in structure. Aortic valve regurgitation is not visualized. No aortic stenosis is present. 8. There is mild dilatation of the ascending aorta measuring 40 mm. 9. The inferior vena cava is dilated in size with >50% respiratory variability, suggesting right atrial pressure of 8 mmHg. Comparison(s):  When compared to 2019, pericardial effusion  is slightly worse (but was present 2 years ago and appears chronic).   Chest x-ray 05/06/2020:  IMPRESSION: Mild left midlung atelectasis. Lungs elsewhere clear. Heart remains diffusely enlarged. Pulmonary vascularity within normal limits. No adenopathy appreciable.  EKG:   09/16/19- Sinus rhythm, Rate: 69 bpm, poor R wave progression, LAFB.   Recent Labs: 04/07/2020: ALT 21 05/06/2020: B Natriuretic Peptide 1,524.0 09/15/2020: BUN WILL FOLLOW; Creatinine, Ser WILL FOLLOW; Hemoglobin 11.7; Platelets 185; Potassium WILL FOLLOW; Sodium WILL FOLLOW  Recent Lipid Panel    Component Value Date/Time   CHOL 125 01/28/2020 1000   TRIG 55 01/28/2020 1000   HDL 50 01/28/2020 1000   CHOLHDL 2.5 01/28/2020 1000   VLDL 12.0 04/30/2019 1144   LDLCALC 61 01/28/2020 1000           Physical Exam:    VS:  BP 130/80 (BP Location: Left Arm, Patient Position: Sitting, Cuff Size: Normal)   Pulse 69   Ht 5\' 6"  (1.676 m)   Wt 187 lb (84.8 kg)   SpO2 95%   BMI 30.18 kg/m     Wt Readings from Last 3 Encounters:  09/15/20 187 lb (84.8 kg)  09/09/20 184 lb 3.2 oz (83.6 kg)  07/30/20 184 lb (83.5 kg)     GEN: Well nourished, well developed in no acute  distress HEENT: Normal NECK: No JVD; No carotid bruits LYMPHATICS: No lymphadenopathy CARDIAC: RRR, no murmurs, rubs, gallops RESPIRATORY:  Clear to auscultation without rales, wheezing or rhonchi  ABDOMEN: Soft, non-tender, non-distended MUSCULOSKELETAL: +2-3 pitting edema(R>L); No deformity  SKIN: Warm and dry NEUROLOGIC:  Alert and oriented x 3 PSYCHIATRIC:  Normal affect   ASSESSMENT:    1. DOE (dyspnea on exertion)   2. Pericardial effusion   3. Pre-procedure lab exam    PLAN:    In order of problems listed above:  Exertional dyspnea - This could be his anginal equivalent.  Since it is continuing to get worse, I would like to go ahead and proceed with a right and left heart catheterization.  This will also help to understand his hemodynamics, cardiac output as well as any evidence of pericardial effusion influencing his cardiac function. - Since his renal function is slightly hampered with chronic kidney disease stage IIIb, creatinine 1.68 previously, we will bring him in 1 hour prior for gentle hydration. - Continue with low-dose Lasix for now.  Weight has been stable.  Chronic pericardial effusion - Has been noted on multiple echocardiograms.  Checking a right and left heart catheterization.  Question constriction.  Would like to have some guidance on whether or not removal of fluid would be beneficial for him.  CKD stage IIIb - Avoid NSAIDs.  Gentle hydration prior to cath.  Chronic diastolic heart failure - Shortness of breath noted with activity.  Wife is concerned.  Shared Decision Making/Informed Consent The risks [stroke (1 in 1000), death (1 in 1000), kidney failure [usually temporary] (1 in 500), bleeding (1 in 200), allergic reaction [possibly serious] (1 in 200)], benefits (diagnostic support and management of coronary artery disease) and alternatives of a cardiac catheterization were discussed in detail with Mr. Devera and he is willing to proceed.         Medication Adjustments/Labs and Tests Ordered: Current medicines are reviewed at length with the patient today.  Concerns regarding medicines are outlined above.  Orders Placed This Encounter  Procedures  . CBC  . Basic metabolic panel  . EKG 12-Lead   No orders of the  defined types were placed in this encounter.   Patient Instructions  Medication Instructions:  The current medical regimen is effective;  continue present plan and medications.  *If you need a refill on your cardiac medications before your next appointment, please call your pharmacy*  Lab Work: Please have blood work today (CBC, BMP)  If you have labs (blood work) drawn today and your tests are completely normal, you will receive your results only by: Marland Kitchen MyChart Message (if you have MyChart) OR . A paper copy in the mail If you have any lab test that is abnormal or we need to change your treatment, we will call you to review the results.   Testing/Procedures:    [facility] OFFICE [address], [address]  [place] [address] Dept: [phone] Loc: [place]  09/15/2020  You are scheduled for a cardiac cath on Monday, May 9 with Dr. Ellyn Hack.  1. Please arrive at the [facility] (Main Entrance A) at [facility]: [address] [place], [place] [address] at 7 AM (This time is two hours before your procedure to ensure your preparation). Free valet parking service is available.   Special note: Every effort is made to have your procedure done on time. Please understand that emergencies sometimes delay scheduled procedures.  2. Diet: Do not eat solid foods after midnight.  The patient may have clear liquids until 5am upon the day of the procedure.  3. Labs: You will need to have blood drawn today: CBC/BMP   Have Covid screening as scheduled Friday.  4. Medication instructions in preparation for your  procedure:  The morning of your procedure please do not take your Metformin, Glimepiride or Furosemide.  You may take your other medications with sips of water unless otherwise instructed.  5. Plan for one night stay--bring personal belongings. 6. Bring a current list of your medications and current insurance cards. 7. You MUST have a responsible person to drive you home. 8. Someone MUST be with you the first 24 hours after you arrive home or your discharge will be delayed. 9. Please wear clothes that are easy to get on and off and wear slip-on shoes.  Thank you for allowing [place] to care for you!   -- [facility]   Follow-Up: At [facility], you and your health needs are our priority.  As part of our continuing mission to provide you with exceptional heart care, we have created designated Provider Care Teams.  These Care Teams include your primary Cardiologist (physician) and Advanced Practice Providers (APPs -  Physician Assistants and Nurse Practitioners) who all work together to provide you with the care you need, when you need it.  We recommend signing up for the patient portal called "MyChart".  Sign up information is provided on this After Visit Summary.  MyChart is used to connect with patients for Virtual Visits (Telemedicine).  Patients are able to view lab/test results, encounter notes, upcoming appointments, etc.  Non-urgent messages can be sent to your provider as well.   To learn more about what you can do with MyChart, go to [email or web address].    Your next appointment:   2 month(s)  The format for your next appointment:   In Person  Provider:   You may see Candee Furbish, MD or one of the following Advanced Practice Providers on your designated Care Team:    Kathyrn Drown, NP  Thank you for choosing [facility]!!          I,Alexis Bryant,acting as a scribe for [employer], MD.,have documented all relevant  documentation on the behalf of Candee Furbish, MD,as directed by  Candee Furbish, MD while in the presence of Candee Furbish, MD.  I, Candee Furbish, MD, have reviewed all documentation for this visit. The documentation on 09/15/20 for the exam, diagnosis, procedures, and orders are all accurate and complete.   Signed, Candee Furbish, MD  09/15/2020 3:07 PM    [facility]

## 2020-09-16 ENCOUNTER — Telehealth: Payer: Self-pay | Admitting: *Deleted

## 2020-09-16 NOTE — Telephone Encounter (Signed)
Pt contacted pre-catheterization scheduled at [facility] for: Monday Sep 20, 2020 10:30 AM Verified arrival time and place: [facility]) at: 7 AM-pre-procedure hydration   No solid food after midnight prior to cath, clear liquids until 5 AM day of procedure.  Hold: Metformin-day of procedure and 48 hours post procedure Losartan-AM of procedure--GFR 42 Lasix-AM of procedure Glimepiride if takes in AM  Except hold medications AM meds can be  taken pre-cath with sips of water including: ASA 81 mg   Confirmed patient has responsible adult to drive home post procedure and be with patient first 24 hours after arriving home: yes  You are allowed ONE visitor in the waiting room during the time you are at the hospital for your procedure. Both you and your visitor must wear a mask once you enter the hospital.   Reviewed procedure/mask/visitor instructions with patient.

## 2020-09-17 ENCOUNTER — Other Ambulatory Visit (HOSPITAL_COMMUNITY)
Admission: RE | Admit: 2020-09-17 | Discharge: 2020-09-17 | Disposition: A | Discharge status: Home / Self Care | Payer: Medicare HMO | Source: Ambulatory Visit | Attending: Cardiology | Admitting: Cardiology

## 2020-09-17 DIAGNOSIS — I25110 Atherosclerotic heart disease of native coronary artery with unstable angina pectoris: Secondary | ICD-10-CM | POA: Diagnosis not present

## 2020-09-17 DIAGNOSIS — I10 Essential (primary) hypertension: Secondary | ICD-10-CM | POA: Diagnosis not present

## 2020-09-17 DIAGNOSIS — I517 Cardiomegaly: Secondary | ICD-10-CM | POA: Diagnosis not present

## 2020-09-17 DIAGNOSIS — Z8546 Personal history of malignant neoplasm of prostate: Secondary | ICD-10-CM | POA: Diagnosis not present

## 2020-09-17 DIAGNOSIS — J439 Emphysema, unspecified: Secondary | ICD-10-CM | POA: Diagnosis not present

## 2020-09-17 DIAGNOSIS — R0602 Shortness of breath: Secondary | ICD-10-CM | POA: Diagnosis not present

## 2020-09-17 DIAGNOSIS — E854 Organ-limited amyloidosis: Secondary | ICD-10-CM | POA: Diagnosis not present

## 2020-09-17 DIAGNOSIS — N183 Chronic kidney disease, stage 3 unspecified: Secondary | ICD-10-CM | POA: Diagnosis not present

## 2020-09-17 DIAGNOSIS — N179 Acute kidney failure, unspecified: Secondary | ICD-10-CM | POA: Diagnosis not present

## 2020-09-17 DIAGNOSIS — I440 Atrioventricular block, first degree: Secondary | ICD-10-CM | POA: Diagnosis not present

## 2020-09-17 DIAGNOSIS — Z87891 Personal history of nicotine dependence: Secondary | ICD-10-CM | POA: Diagnosis not present

## 2020-09-17 DIAGNOSIS — I5043 Acute on chronic combined systolic (congestive) and diastolic (congestive) heart failure: Secondary | ICD-10-CM | POA: Diagnosis not present

## 2020-09-17 DIAGNOSIS — G4733 Obstructive sleep apnea (adult) (pediatric): Secondary | ICD-10-CM | POA: Diagnosis present

## 2020-09-17 DIAGNOSIS — J9811 Atelectasis: Secondary | ICD-10-CM | POA: Diagnosis not present

## 2020-09-17 DIAGNOSIS — I5033 Acute on chronic diastolic (congestive) heart failure: Secondary | ICD-10-CM | POA: Diagnosis not present

## 2020-09-17 DIAGNOSIS — Z7982 Long term (current) use of aspirin: Secondary | ICD-10-CM | POA: Diagnosis not present

## 2020-09-17 DIAGNOSIS — I200 Unstable angina: Secondary | ICD-10-CM | POA: Diagnosis not present

## 2020-09-17 DIAGNOSIS — I43 Cardiomyopathy in diseases classified elsewhere: Secondary | ICD-10-CM | POA: Diagnosis not present

## 2020-09-17 DIAGNOSIS — I5041 Acute combined systolic (congestive) and diastolic (congestive) heart failure: Secondary | ICD-10-CM | POA: Diagnosis not present

## 2020-09-17 DIAGNOSIS — Z8249 Family history of ischemic heart disease and other diseases of the circulatory system: Secondary | ICD-10-CM | POA: Diagnosis not present

## 2020-09-17 DIAGNOSIS — I2510 Atherosclerotic heart disease of native coronary artery without angina pectoris: Secondary | ICD-10-CM | POA: Diagnosis present

## 2020-09-17 DIAGNOSIS — Z01812 Encounter for preprocedural laboratory examination: Secondary | ICD-10-CM | POA: Insufficient documentation

## 2020-09-17 DIAGNOSIS — E1022 Type 1 diabetes mellitus with diabetic chronic kidney disease: Secondary | ICD-10-CM | POA: Diagnosis not present

## 2020-09-17 DIAGNOSIS — Z833 Family history of diabetes mellitus: Secondary | ICD-10-CM | POA: Diagnosis not present

## 2020-09-17 DIAGNOSIS — R0600 Dyspnea, unspecified: Secondary | ICD-10-CM | POA: Diagnosis not present

## 2020-09-17 DIAGNOSIS — I313 Pericardial effusion (noninflammatory): Secondary | ICD-10-CM | POA: Diagnosis not present

## 2020-09-17 DIAGNOSIS — Z20822 Contact with and (suspected) exposure to covid-19: Secondary | ICD-10-CM | POA: Diagnosis not present

## 2020-09-17 DIAGNOSIS — I2729 Other secondary pulmonary hypertension: Secondary | ICD-10-CM | POA: Diagnosis present

## 2020-09-17 DIAGNOSIS — Z8673 Personal history of transient ischemic attack (TIA), and cerebral infarction without residual deficits: Secondary | ICD-10-CM | POA: Diagnosis not present

## 2020-09-17 DIAGNOSIS — Z888 Allergy status to other drugs, medicaments and biological substances status: Secondary | ICD-10-CM | POA: Diagnosis not present

## 2020-09-17 DIAGNOSIS — I25119 Atherosclerotic heart disease of native coronary artery with unspecified angina pectoris: Secondary | ICD-10-CM | POA: Diagnosis not present

## 2020-09-17 DIAGNOSIS — Z8674 Personal history of sudden cardiac arrest: Secondary | ICD-10-CM | POA: Diagnosis not present

## 2020-09-17 DIAGNOSIS — M1A9XX0 Chronic gout, unspecified, without tophus (tophi): Secondary | ICD-10-CM | POA: Diagnosis not present

## 2020-09-17 DIAGNOSIS — E876 Hypokalemia: Secondary | ICD-10-CM | POA: Diagnosis not present

## 2020-09-17 DIAGNOSIS — I130 Hypertensive heart and chronic kidney disease with heart failure and stage 1 through stage 4 chronic kidney disease, or unspecified chronic kidney disease: Secondary | ICD-10-CM | POA: Diagnosis not present

## 2020-09-17 DIAGNOSIS — R001 Bradycardia, unspecified: Secondary | ICD-10-CM | POA: Diagnosis not present

## 2020-09-17 DIAGNOSIS — I110 Hypertensive heart disease with heart failure: Secondary | ICD-10-CM | POA: Diagnosis not present

## 2020-09-17 DIAGNOSIS — N1832 Chronic kidney disease, stage 3b: Secondary | ICD-10-CM | POA: Diagnosis not present

## 2020-09-18 LAB — SARS CORONAVIRUS 2 (TAT 6-24 HRS): SARS Coronavirus 2: NEGATIVE

## 2020-09-20 ENCOUNTER — Inpatient Hospital Stay (HOSPITAL_COMMUNITY)
Admission: RE | Admit: 2020-09-20 | Discharge: 2020-09-30 | DRG: 270 | Disposition: A | Discharge status: Home / Self Care | Payer: Medicare HMO | Attending: Cardiology | Admitting: Cardiology

## 2020-09-20 ENCOUNTER — Other Ambulatory Visit: Payer: Self-pay

## 2020-09-20 ENCOUNTER — Encounter (HOSPITAL_COMMUNITY)
Admission: RE | Disposition: A | Discharge status: Home / Self Care | Payer: Medicare HMO | Source: Home / Self Care | Attending: Cardiology

## 2020-09-20 ENCOUNTER — Encounter (HOSPITAL_COMMUNITY): Payer: Self-pay | Admitting: Cardiology

## 2020-09-20 DIAGNOSIS — Z8673 Personal history of transient ischemic attack (TIA), and cerebral infarction without residual deficits: Secondary | ICD-10-CM | POA: Diagnosis not present

## 2020-09-20 DIAGNOSIS — Z833 Family history of diabetes mellitus: Secondary | ICD-10-CM

## 2020-09-20 DIAGNOSIS — N183 Chronic kidney disease, stage 3 unspecified: Secondary | ICD-10-CM | POA: Diagnosis not present

## 2020-09-20 DIAGNOSIS — I43 Cardiomyopathy in diseases classified elsewhere: Secondary | ICD-10-CM | POA: Diagnosis present

## 2020-09-20 DIAGNOSIS — Z7984 Long term (current) use of oral hypoglycemic drugs: Secondary | ICD-10-CM

## 2020-09-20 DIAGNOSIS — I200 Unstable angina: Secondary | ICD-10-CM | POA: Diagnosis not present

## 2020-09-20 DIAGNOSIS — I2729 Other secondary pulmonary hypertension: Secondary | ICD-10-CM | POA: Diagnosis present

## 2020-09-20 DIAGNOSIS — I5043 Acute on chronic combined systolic (congestive) and diastolic (congestive) heart failure: Secondary | ICD-10-CM | POA: Diagnosis not present

## 2020-09-20 DIAGNOSIS — I25110 Atherosclerotic heart disease of native coronary artery with unstable angina pectoris: Secondary | ICD-10-CM | POA: Diagnosis not present

## 2020-09-20 DIAGNOSIS — Z87891 Personal history of nicotine dependence: Secondary | ICD-10-CM

## 2020-09-20 DIAGNOSIS — I130 Hypertensive heart and chronic kidney disease with heart failure and stage 1 through stage 4 chronic kidney disease, or unspecified chronic kidney disease: Principal | ICD-10-CM | POA: Diagnosis present

## 2020-09-20 DIAGNOSIS — Z8674 Personal history of sudden cardiac arrest: Secondary | ICD-10-CM

## 2020-09-20 DIAGNOSIS — M1A9XX0 Chronic gout, unspecified, without tophus (tophi): Secondary | ICD-10-CM | POA: Diagnosis not present

## 2020-09-20 DIAGNOSIS — E876 Hypokalemia: Secondary | ICD-10-CM | POA: Diagnosis not present

## 2020-09-20 DIAGNOSIS — Z8249 Family history of ischemic heart disease and other diseases of the circulatory system: Secondary | ICD-10-CM | POA: Diagnosis not present

## 2020-09-20 DIAGNOSIS — R001 Bradycardia, unspecified: Secondary | ICD-10-CM | POA: Diagnosis present

## 2020-09-20 DIAGNOSIS — R0600 Dyspnea, unspecified: Secondary | ICD-10-CM | POA: Diagnosis not present

## 2020-09-20 DIAGNOSIS — E1022 Type 1 diabetes mellitus with diabetic chronic kidney disease: Secondary | ICD-10-CM | POA: Diagnosis present

## 2020-09-20 DIAGNOSIS — Z09 Encounter for follow-up examination after completed treatment for conditions other than malignant neoplasm: Secondary | ICD-10-CM

## 2020-09-20 DIAGNOSIS — G4733 Obstructive sleep apnea (adult) (pediatric): Secondary | ICD-10-CM | POA: Diagnosis present

## 2020-09-20 DIAGNOSIS — I2510 Atherosclerotic heart disease of native coronary artery without angina pectoris: Secondary | ICD-10-CM | POA: Diagnosis present

## 2020-09-20 DIAGNOSIS — I5022 Chronic systolic (congestive) heart failure: Secondary | ICD-10-CM

## 2020-09-20 DIAGNOSIS — N179 Acute kidney failure, unspecified: Secondary | ICD-10-CM | POA: Diagnosis not present

## 2020-09-20 DIAGNOSIS — I25119 Atherosclerotic heart disease of native coronary artery with unspecified angina pectoris: Secondary | ICD-10-CM

## 2020-09-20 DIAGNOSIS — N1832 Chronic kidney disease, stage 3b: Secondary | ICD-10-CM | POA: Diagnosis present

## 2020-09-20 DIAGNOSIS — I440 Atrioventricular block, first degree: Secondary | ICD-10-CM | POA: Diagnosis present

## 2020-09-20 DIAGNOSIS — Z7982 Long term (current) use of aspirin: Secondary | ICD-10-CM

## 2020-09-20 DIAGNOSIS — I5033 Acute on chronic diastolic (congestive) heart failure: Secondary | ICD-10-CM | POA: Diagnosis not present

## 2020-09-20 DIAGNOSIS — I3139 Other pericardial effusion (noninflammatory): Secondary | ICD-10-CM

## 2020-09-20 DIAGNOSIS — Z888 Allergy status to other drugs, medicaments and biological substances status: Secondary | ICD-10-CM | POA: Diagnosis not present

## 2020-09-20 DIAGNOSIS — Z8546 Personal history of malignant neoplasm of prostate: Secondary | ICD-10-CM | POA: Diagnosis not present

## 2020-09-20 DIAGNOSIS — E854 Organ-limited amyloidosis: Secondary | ICD-10-CM

## 2020-09-20 DIAGNOSIS — Z79899 Other long term (current) drug therapy: Secondary | ICD-10-CM

## 2020-09-20 DIAGNOSIS — Z20822 Contact with and (suspected) exposure to covid-19: Secondary | ICD-10-CM | POA: Diagnosis present

## 2020-09-20 DIAGNOSIS — E108 Type 1 diabetes mellitus with unspecified complications: Secondary | ICD-10-CM | POA: Diagnosis present

## 2020-09-20 DIAGNOSIS — E119 Type 2 diabetes mellitus without complications: Secondary | ICD-10-CM | POA: Diagnosis present

## 2020-09-20 DIAGNOSIS — I5041 Acute combined systolic (congestive) and diastolic (congestive) heart failure: Secondary | ICD-10-CM | POA: Diagnosis present

## 2020-09-20 DIAGNOSIS — Z01812 Encounter for preprocedural laboratory examination: Secondary | ICD-10-CM

## 2020-09-20 DIAGNOSIS — I313 Pericardial effusion (noninflammatory): Secondary | ICD-10-CM | POA: Diagnosis present

## 2020-09-20 DIAGNOSIS — Z9889 Other specified postprocedural states: Secondary | ICD-10-CM | POA: Diagnosis present

## 2020-09-20 DIAGNOSIS — R0609 Other forms of dyspnea: Secondary | ICD-10-CM | POA: Diagnosis present

## 2020-09-20 HISTORY — PX: RIGHT/LEFT HEART CATH AND CORONARY ANGIOGRAPHY: CATH118266

## 2020-09-20 HISTORY — DX: Chronic diastolic (congestive) heart failure: I50.32

## 2020-09-20 LAB — GLUCOSE, CAPILLARY
Glucose-Capillary: 108 mg/dL — ABNORMAL HIGH (ref 70–99)
Glucose-Capillary: 89 mg/dL (ref 70–99)
Glucose-Capillary: 83 mg/dL (ref 70–99)
Glucose-Capillary: 150 mg/dL — ABNORMAL HIGH (ref 70–99)

## 2020-09-20 LAB — POCT I-STAT EG7
Acid-base deficit: 3 mmol/L — ABNORMAL HIGH (ref 0.0–2.0)
Acid-base deficit: 3 mmol/L — ABNORMAL HIGH (ref 0.0–2.0)
Bicarbonate: 22.9 mmol/L (ref 20.0–28.0)
Bicarbonate: 23.5 mmol/L (ref 20.0–28.0)
Calcium, Ion: 1.36 mmol/L (ref 1.15–1.40)
Calcium, Ion: 1.37 mmol/L (ref 1.15–1.40)
HCT: 35 % — ABNORMAL LOW (ref 39.0–52.0)
HCT: 35 % — ABNORMAL LOW (ref 39.0–52.0)
Hemoglobin: 11.9 g/dL — ABNORMAL LOW (ref 13.0–17.0)
Hemoglobin: 11.9 g/dL — ABNORMAL LOW (ref 13.0–17.0)
O2 Saturation: 55 %
O2 Saturation: 58 %
Potassium: 3.9 mmol/L (ref 3.5–5.1)
Potassium: 4 mmol/L (ref 3.5–5.1)
Sodium: 146 mmol/L — ABNORMAL HIGH (ref 135–145)
Sodium: 146 mmol/L — ABNORMAL HIGH (ref 135–145)
TCO2: 24 mmol/L (ref 22–32)
TCO2: 25 mmol/L (ref 22–32)
pCO2, Ven: 44.7 mmHg (ref 44.0–60.0)
pCO2, Ven: 46.1 mmHg (ref 44.0–60.0)
pH, Ven: 7.315 (ref 7.250–7.430)
pH, Ven: 7.317 (ref 7.250–7.430)
pO2, Ven: 32 mmHg (ref 32.0–45.0)
pO2, Ven: 33 mmHg (ref 32.0–45.0)

## 2020-09-20 LAB — POCT I-STAT 7, (LYTES, BLD GAS, ICA,H+H)
Acid-base deficit: 5 mmol/L — ABNORMAL HIGH (ref 0.0–2.0)
Bicarbonate: 20.5 mmol/L (ref 20.0–28.0)
Calcium, Ion: 1.35 mmol/L (ref 1.15–1.40)
HCT: 34 % — ABNORMAL LOW (ref 39.0–52.0)
Hemoglobin: 11.6 g/dL — ABNORMAL LOW (ref 13.0–17.0)
O2 Saturation: 97 %
Potassium: 3.9 mmol/L (ref 3.5–5.1)
Sodium: 146 mmol/L — ABNORMAL HIGH (ref 135–145)
TCO2: 22 mmol/L (ref 22–32)
pCO2 arterial: 37.9 mmHg (ref 32.0–48.0)
pH, Arterial: 7.341 — ABNORMAL LOW (ref 7.350–7.450)
pO2, Arterial: 96 mmHg (ref 83.0–108.0)

## 2020-09-20 LAB — CBC
HCT: 40.7 % (ref 39.0–52.0)
Hemoglobin: 12.7 g/dL — ABNORMAL LOW (ref 13.0–17.0)
MCH: 27.1 pg (ref 26.0–34.0)
MCHC: 31.2 g/dL (ref 30.0–36.0)
MCV: 86.8 fL (ref 80.0–100.0)
Platelets: 186 10*3/uL (ref 150–400)
RBC: 4.69 MIL/uL (ref 4.22–5.81)
RDW: 15.2 % (ref 11.5–15.5)
WBC: 2.7 10*3/uL — ABNORMAL LOW (ref 4.0–10.5)
nRBC: 0 % (ref 0.0–0.2)

## 2020-09-20 LAB — CREATININE, SERUM
Creatinine, Ser: 1.61 mg/dL — ABNORMAL HIGH (ref 0.61–1.24)
GFR, Estimated: 45 mL/min — ABNORMAL LOW (ref >60)

## 2020-09-20 SURGERY — RIGHT/LEFT HEART CATH AND CORONARY ANGIOGRAPHY
Anesthesia: LOCAL

## 2020-09-20 MED ORDER — SACUBITRIL-VALSARTAN 49-51 MG PO TABS
1.0000 | ORAL_TABLET | Freq: Two times a day (BID) | ORAL | Status: DC
  Administered 2020-09-20 – 2020-09-22 (×4): 1 via ORAL
  Filled 2020-09-20 (×4): qty 1

## 2020-09-20 MED ORDER — HEPARIN SODIUM (PORCINE) 5000 UNIT/ML IJ SOLN
5000.0000 [IU] | Freq: Three times a day (TID) | INTRAMUSCULAR | Status: DC
  Administered 2020-09-20 – 2020-09-27 (×20): 5000 [IU] via SUBCUTANEOUS
  Filled 2020-09-20 (×20): qty 1

## 2020-09-20 MED ORDER — SODIUM CHLORIDE 0.9% FLUSH: 3.0000 mL | Freq: Two times a day (BID) | INTRAVENOUS | Status: DC

## 2020-09-20 MED ORDER — FUROSEMIDE 10 MG/ML IJ SOLN
40.0000 mg | Freq: Two times a day (BID) | INTRAMUSCULAR | Status: DC
  Administered 2020-09-20 – 2020-09-22 (×5): 40 mg via INTRAVENOUS
  Filled 2020-09-20 (×6): qty 4

## 2020-09-20 MED ORDER — MIDAZOLAM HCL 2 MG/2ML IJ SOLN
INTRAMUSCULAR | Status: DC | PRN
  Administered 2020-09-20: 1 mg via INTRAVENOUS

## 2020-09-20 MED ORDER — IOHEXOL 350 MG/ML SOLN
INTRAVENOUS | Status: DC | PRN
  Administered 2020-09-20: 50 mL

## 2020-09-20 MED ORDER — FENTANYL CITRATE (PF) 100 MCG/2ML IJ SOLN
INTRAMUSCULAR | Status: AC
  Filled 2020-09-20: qty 2

## 2020-09-20 MED ORDER — INSULIN ASPART 100 UNIT/ML IJ SOLN
0.0000 [IU] | Freq: Three times a day (TID) | INTRAMUSCULAR | Status: DC
  Administered 2020-09-21: 3 [IU] via SUBCUTANEOUS
  Administered 2020-09-21 – 2020-09-23 (×5): 2 [IU] via SUBCUTANEOUS
  Administered 2020-09-23: 5 [IU] via SUBCUTANEOUS
  Administered 2020-09-24 (×2): 2 [IU] via SUBCUTANEOUS
  Administered 2020-09-24 – 2020-09-25 (×2): 3 [IU] via SUBCUTANEOUS
  Administered 2020-09-25: 2 [IU] via SUBCUTANEOUS
  Administered 2020-09-25: 3 [IU] via SUBCUTANEOUS
  Administered 2020-09-26: 2 [IU] via SUBCUTANEOUS
  Administered 2020-09-26 – 2020-09-27 (×2): 3 [IU] via SUBCUTANEOUS
  Administered 2020-09-28 (×2): 5 [IU] via SUBCUTANEOUS
  Administered 2020-09-29: 2 [IU] via SUBCUTANEOUS
  Administered 2020-09-29: 3 [IU] via SUBCUTANEOUS
  Administered 2020-09-29: 2 [IU] via SUBCUTANEOUS
  Administered 2020-09-30 (×2): 3 [IU] via SUBCUTANEOUS

## 2020-09-20 MED ORDER — HYDRALAZINE HCL 20 MG/ML IJ SOLN
INTRAMUSCULAR | Status: AC
  Filled 2020-09-20: qty 1

## 2020-09-20 MED ORDER — HEPARIN (PORCINE) IN NACL 1000-0.9 UT/500ML-% IV SOLN
INTRAVENOUS | Status: AC
  Filled 2020-09-20: qty 1000

## 2020-09-20 MED ORDER — SODIUM CHLORIDE 0.9 % IV SOLN: 250.0000 mL | INTRAVENOUS | Status: DC | PRN

## 2020-09-20 MED ORDER — FUROSEMIDE 10 MG/ML IJ SOLN
INTRAMUSCULAR | Status: AC
  Filled 2020-09-20: qty 4

## 2020-09-20 MED ORDER — SODIUM CHLORIDE 0.9 % IV SOLN: INTRAVENOUS | Status: AC

## 2020-09-20 MED ORDER — LIDOCAINE HCL (PF) 1 % IJ SOLN
INTRAMUSCULAR | Status: AC
  Filled 2020-09-20: qty 30

## 2020-09-20 MED ORDER — FENTANYL CITRATE (PF) 100 MCG/2ML IJ SOLN
INTRAMUSCULAR | Status: DC | PRN
  Administered 2020-09-20: 25 ug via INTRAVENOUS

## 2020-09-20 MED ORDER — ONDANSETRON HCL 4 MG/2ML IJ SOLN
4.0000 mg | Freq: Four times a day (QID) | INTRAMUSCULAR | Status: DC | PRN
  Administered 2020-09-23: 4 mg via INTRAVENOUS
  Filled 2020-09-20: qty 2

## 2020-09-20 MED ORDER — BISMUTH SUBSALICYLATE 262 MG/15ML PO SUSP
30.0000 mL | Freq: Four times a day (QID) | ORAL | Status: DC | PRN
  Filled 2020-09-20: qty 236

## 2020-09-20 MED ORDER — LABETALOL HCL 5 MG/ML IV SOLN
10.0000 mg | INTRAVENOUS | Status: AC | PRN
  Administered 2020-09-20: 10 mg via INTRAVENOUS
  Filled 2020-09-20: qty 4

## 2020-09-20 MED ORDER — HEPARIN SODIUM (PORCINE) 1000 UNIT/ML IJ SOLN
INTRAMUSCULAR | Status: DC | PRN
  Administered 2020-09-20: 4000 [IU] via INTRAVENOUS

## 2020-09-20 MED ORDER — ASPIRIN 81 MG PO CHEW
81.0000 mg | CHEWABLE_TABLET | ORAL | Status: DC
Start: 2020-09-20 — End: 2020-09-20

## 2020-09-20 MED ORDER — ASPIRIN EC 81 MG PO TBEC
81.0000 mg | DELAYED_RELEASE_TABLET | Freq: Every morning | ORAL | Status: DC
  Administered 2020-09-21 – 2020-09-30 (×9): 81 mg via ORAL
  Filled 2020-09-20 (×9): qty 1

## 2020-09-20 MED ORDER — MIDAZOLAM HCL 2 MG/2ML IJ SOLN
INTRAMUSCULAR | Status: AC
  Filled 2020-09-20: qty 2

## 2020-09-20 MED ORDER — VERAPAMIL HCL 2.5 MG/ML IV SOLN
INTRAVENOUS | Status: AC
  Filled 2020-09-20: qty 2

## 2020-09-20 MED ORDER — GABAPENTIN 300 MG PO CAPS
300.0000 mg | ORAL_CAPSULE | Freq: Every day | ORAL | Status: DC
  Administered 2020-09-20 – 2020-09-29 (×10): 300 mg via ORAL
  Filled 2020-09-20 (×10): qty 1

## 2020-09-20 MED ORDER — HEPARIN (PORCINE) IN NACL 1000-0.9 UT/500ML-% IV SOLN
INTRAVENOUS | Status: DC | PRN
Start: 2020-09-20 — End: 2020-09-20
  Administered 2020-09-20 (×2): 500 mL

## 2020-09-20 MED ORDER — FUROSEMIDE 10 MG/ML IJ SOLN
INTRAMUSCULAR | Status: DC | PRN
  Administered 2020-09-20: 40 mg via INTRAVENOUS

## 2020-09-20 MED ORDER — ISOSORBIDE MONONITRATE ER 60 MG PO TB24
60.0000 mg | ORAL_TABLET | Freq: Two times a day (BID) | ORAL | Status: DC
  Administered 2020-09-20 – 2020-09-22 (×5): 60 mg via ORAL
  Filled 2020-09-20 (×5): qty 1

## 2020-09-20 MED ORDER — ALBUTEROL SULFATE (2.5 MG/3ML) 0.083% IN NEBU: 3.0000 mL | INHALATION_SOLUTION | RESPIRATORY_TRACT | Status: DC | PRN

## 2020-09-20 MED ORDER — SODIUM CHLORIDE 0.9 % WEIGHT BASED INFUSION: 1.0000 mL/kg/h | INTRAVENOUS | Status: DC

## 2020-09-20 MED ORDER — SODIUM CHLORIDE 0.9 % WEIGHT BASED INFUSION
3.0000 mL/kg/h | INTRAVENOUS | Status: DC
  Administered 2020-09-20: 3 mL/kg/h via INTRAVENOUS

## 2020-09-20 MED ORDER — TICAGRELOR 90 MG PO TABS
90.0000 mg | ORAL_TABLET | Freq: Two times a day (BID) | ORAL | Status: DC
  Administered 2020-09-21 – 2020-09-22 (×3): 90 mg via ORAL
  Filled 2020-09-20 (×5): qty 1

## 2020-09-20 MED ORDER — LIDOCAINE HCL (PF) 1 % IJ SOLN
INTRAMUSCULAR | Status: DC | PRN
Start: 2020-09-20 — End: 2020-09-20
  Administered 2020-09-20 (×2): 2 mL
  Administered 2020-09-20: 5 mL

## 2020-09-20 MED ORDER — AMLODIPINE BESYLATE 10 MG PO TABS
10.0000 mg | ORAL_TABLET | Freq: Every morning | ORAL | Status: DC
  Administered 2020-09-21 – 2020-09-22 (×2): 10 mg via ORAL
  Filled 2020-09-20 (×3): qty 1

## 2020-09-20 MED ORDER — TICAGRELOR 90 MG PO TABS
180.0000 mg | ORAL_TABLET | Freq: Once | ORAL | Status: AC
  Administered 2020-09-20: 180 mg via ORAL
  Filled 2020-09-20: qty 2

## 2020-09-20 MED ORDER — CLONIDINE HCL 0.3 MG PO TABS
0.3000 mg | ORAL_TABLET | Freq: Two times a day (BID) | ORAL | Status: DC
  Administered 2020-09-20 – 2020-09-21 (×2): 0.3 mg via ORAL
  Filled 2020-09-20 (×2): qty 1

## 2020-09-20 MED ORDER — ALLOPURINOL 100 MG PO TABS
50.0000 mg | ORAL_TABLET | Freq: Every morning | ORAL | Status: DC
  Administered 2020-09-21 – 2020-09-30 (×10): 50 mg via ORAL
  Filled 2020-09-20 (×10): qty 1

## 2020-09-20 MED ORDER — VERAPAMIL HCL 2.5 MG/ML IV SOLN
INTRAVENOUS | Status: DC | PRN
  Administered 2020-09-20: 10 mL via INTRA_ARTERIAL

## 2020-09-20 MED ORDER — ACETAMINOPHEN 325 MG PO TABS
650.0000 mg | ORAL_TABLET | ORAL | Status: DC | PRN
  Administered 2020-09-20 – 2020-09-26 (×4): 650 mg via ORAL
  Filled 2020-09-20 (×4): qty 2

## 2020-09-20 MED ORDER — HYDRALAZINE HCL 20 MG/ML IJ SOLN
10.0000 mg | INTRAMUSCULAR | Status: AC | PRN
  Administered 2020-09-20 (×2): 10 mg via INTRAVENOUS
  Filled 2020-09-20: qty 1

## 2020-09-20 MED ORDER — HYDRALAZINE HCL 50 MG PO TABS
75.0000 mg | ORAL_TABLET | Freq: Three times a day (TID) | ORAL | Status: DC
  Administered 2020-09-20 – 2020-09-21 (×3): 75 mg via ORAL
  Filled 2020-09-20 (×3): qty 1

## 2020-09-20 MED ORDER — SODIUM CHLORIDE 0.9% FLUSH
3.0000 mL | INTRAVENOUS | Status: DC | PRN
Start: 2020-09-20 — End: 2020-09-20

## 2020-09-20 MED ORDER — SODIUM CHLORIDE 0.9% FLUSH: 3.0000 mL | INTRAVENOUS | Status: DC | PRN

## 2020-09-20 MED ORDER — SODIUM CHLORIDE 0.9% FLUSH
3.0000 mL | Freq: Two times a day (BID) | INTRAVENOUS | Status: DC
  Administered 2020-09-20 – 2020-09-24 (×5): 3 mL via INTRAVENOUS

## 2020-09-20 MED ORDER — HEPARIN SODIUM (PORCINE) 1000 UNIT/ML IJ SOLN
INTRAMUSCULAR | Status: AC
  Filled 2020-09-20: qty 1

## 2020-09-20 SURGICAL SUPPLY — 14 items
CATH BALLN WEDGE 5F 110CM (CATHETERS) ×1 IMPLANT
CATH OPTITORQUE TIG 4.0 5F (CATHETERS) ×1 IMPLANT
DEVICE RAD COMP TR BAND LRG (VASCULAR PRODUCTS) ×1 IMPLANT
GLIDESHEATH SLEND SS 6F .021 (SHEATH) ×1 IMPLANT
GUIDEWIRE .025 260CM (WIRE) ×1 IMPLANT
GUIDEWIRE INQWIRE 1.5J.035X260 (WIRE) IMPLANT
INQWIRE 1.5J .035X260CM (WIRE) ×2
KIT HEART LEFT (KITS) ×2 IMPLANT
PACK CARDIAC CATHETERIZATION (CUSTOM PROCEDURE TRAY) ×2 IMPLANT
SHEATH GLIDE SLENDER 4/5FR (SHEATH) ×1 IMPLANT
SHEATH PINNACLE 5F 10CM (SHEATH) ×1 IMPLANT
TRANSDUCER W/STOPCOCK (MISCELLANEOUS) ×2 IMPLANT
TUBING CIL FLEX 10 FLL-RA (TUBING) ×2 IMPLANT
WIRE EMERALD 3MM-J .025X260CM (WIRE) ×1 IMPLANT

## 2020-09-20 NOTE — Progress Notes (Signed)
Patient's wife number was wrong in chart it is [facility]

## 2020-09-20 NOTE — Progress Notes (Signed)
   09/20/20 1700  Assess: MEWS Score  BP (!) 149/91  Pulse Rate (!) 57  ECG Heart Rate (!) 58  Resp (!) 26  SpO2 96 %  Assess: MEWS Score  MEWS Temp 0  MEWS Systolic 0  MEWS Pulse 0  MEWS RR 2  MEWS LOC 0  MEWS Score 2  MEWS Score Color Yellow  Treat  Pain Scale 0-10  Pain Score 0  Take Vital Signs  Increase Vital Sign Frequency  Yellow: Q 2hr X 2 then Q 4hr X 2, if remains yellow, continue Q 4hrs  Escalate  MEWS: Escalate Yellow: discuss with charge nurse/RN and consider discussing with provider and RRT  Notify: Charge Nurse/RN  Name of Charge Nurse/RN Notified [place]  Date Charge Nurse/RN Notified 09/20/20  Time Charge Nurse/RN Notified 1718  Patient here after cath lab, on q30 minute vitals, blood pressure improving, hx of brady, will continue to monitor

## 2020-09-20 NOTE — Plan of Care (Signed)

## 2020-09-20 NOTE — Progress Notes (Signed)
Site area: RT FEMORAL VEINSite Prior to Removal:  Level  Pressure Applied For:15 MSite area:  Site Prior to Removal:  Level 0 Pressure Applied For:15 MINUTES Manual: YES   Patient Status During Pull:  AWAKE Post Pull Site:  Level 0 Post Pull Instructions Given:  YES Post Pull Pulses Present:  Dressing Applied:  YES Bedrest begins @ 15:00 Comments:  INUTES

## 2020-09-20 NOTE — Interval H&P Note (Signed)
History and Physical Interval Note:  09/20/2020 1:15 PM  [name]  has presented today for surgery, with the diagnosis of chest pain / shortness of breath on exertion (Class III angina).    The various methods of treatment have been discussed with the patient and family. After consideration of risks, benefits and other options for treatment, the patient has consented to  Procedure(s): RIGHT/LEFT HEART CATH AND CORONARY ANGIOGRAPHY (N/A)  PERCUTANEOUS CORONARY INTERVENTION   as a surgical intervention.  The patient's history has been reviewed, patient examined, no change in status, stable for surgery.  I have reviewed the patient's chart and labs.  Questions were answered to the patient's satisfaction.     Cath Lab Visit (complete for each Cath Lab visit)  Clinical Evaluation Leading to the Procedure:   ACS: No.  Non-ACS:    Anginal Classification: CCS III (~II)  Anti-ischemic medical therapy: Minimal Therapy (1 class of medications)  Non-Invasive Test Results: No non-invasive testing performed  Prior CABG: No previous CABG    Glenetta Hew

## 2020-09-21 ENCOUNTER — Encounter (HOSPITAL_COMMUNITY): Payer: Self-pay | Admitting: Cardiology

## 2020-09-21 ENCOUNTER — Other Ambulatory Visit (HOSPITAL_COMMUNITY): Payer: Self-pay

## 2020-09-21 ENCOUNTER — Inpatient Hospital Stay (HOSPITAL_COMMUNITY): Payer: Medicare HMO

## 2020-09-21 DIAGNOSIS — I25119 Atherosclerotic heart disease of native coronary artery with unspecified angina pectoris: Secondary | ICD-10-CM | POA: Diagnosis not present

## 2020-09-21 DIAGNOSIS — I5043 Acute on chronic combined systolic (congestive) and diastolic (congestive) heart failure: Secondary | ICD-10-CM

## 2020-09-21 LAB — COMPREHENSIVE METABOLIC PANEL
ALT: 17 U/L (ref 0–44)
AST: 25 U/L (ref 15–41)
Albumin: 3.1 g/dL — ABNORMAL LOW (ref 3.5–5.0)
Alkaline Phosphatase: 63 U/L (ref 38–126)
Anion gap: 9 (ref 5–15)
BUN: 27 mg/dL — ABNORMAL HIGH (ref 8–23)
CO2: 24 mmol/L (ref 22–32)
Calcium: 9.7 mg/dL (ref 8.9–10.3)
Chloride: 110 mmol/L (ref 98–111)
Creatinine, Ser: 1.55 mg/dL — ABNORMAL HIGH (ref 0.61–1.24)
GFR, Estimated: 47 mL/min — ABNORMAL LOW (ref >60)
Glucose, Bld: 106 mg/dL — ABNORMAL HIGH (ref 70–99)
Potassium: 3.5 mmol/L (ref 3.5–5.1)
Sodium: 143 mmol/L (ref 135–145)
Total Bilirubin: 0.7 mg/dL (ref 0.3–1.2)
Total Protein: 5.6 g/dL — ABNORMAL LOW (ref 6.5–8.1)

## 2020-09-21 LAB — CBC
HCT: 40.7 % (ref 39.0–52.0)
Hemoglobin: 12.7 g/dL — ABNORMAL LOW (ref 13.0–17.0)
MCH: 26.8 pg (ref 26.0–34.0)
MCHC: 31.2 g/dL (ref 30.0–36.0)
MCV: 86 fL (ref 80.0–100.0)
Platelets: 191 10*3/uL (ref 150–400)
RBC: 4.73 MIL/uL (ref 4.22–5.81)
RDW: 15.1 % (ref 11.5–15.5)
WBC: 3.3 10*3/uL — ABNORMAL LOW (ref 4.0–10.5)
nRBC: 0 % (ref 0.0–0.2)

## 2020-09-21 LAB — ECHOCARDIOGRAM COMPLETE
AR max vel: 2.9 cm2
AV Area VTI: 2.82 cm2
AV Area mean vel: 2.62 cm2
AV Mean grad: 4 mmHg
AV Peak grad: 6.6 mmHg
Ao pk vel: 1.28 m/s
Area-P 1/2: 3.99 cm2
Height: 66 in
S' Lateral: 4 cm
Weight: 2885.38 oz

## 2020-09-21 LAB — HEMOGLOBIN A1C
Hgb A1c MFr Bld: 6.4 % — ABNORMAL HIGH (ref 4.8–5.6)
Mean Plasma Glucose: 136.98 mg/dL

## 2020-09-21 LAB — GLUCOSE, CAPILLARY
Glucose-Capillary: 136 mg/dL — ABNORMAL HIGH (ref 70–99)
Glucose-Capillary: 166 mg/dL — ABNORMAL HIGH (ref 70–99)
Glucose-Capillary: 122 mg/dL — ABNORMAL HIGH (ref 70–99)
Glucose-Capillary: 127 mg/dL — ABNORMAL HIGH (ref 70–99)

## 2020-09-21 MED ORDER — ROSUVASTATIN CALCIUM 20 MG PO TABS
40.0000 mg | ORAL_TABLET | Freq: Every day | ORAL | Status: DC
  Administered 2020-09-21 – 2020-09-30 (×10): 40 mg via ORAL
  Filled 2020-09-21 (×10): qty 2

## 2020-09-21 MED ORDER — CLONIDINE HCL 0.1 MG PO TABS
0.1000 mg | ORAL_TABLET | Freq: Two times a day (BID) | ORAL | Status: DC
  Administered 2020-09-21 – 2020-09-22 (×2): 0.1 mg via ORAL
  Filled 2020-09-21 (×2): qty 1

## 2020-09-21 MED ORDER — HYDRALAZINE HCL 50 MG PO TABS
100.0000 mg | ORAL_TABLET | Freq: Three times a day (TID) | ORAL | Status: DC
  Administered 2020-09-21 – 2020-09-22 (×5): 100 mg via ORAL
  Filled 2020-09-21 (×5): qty 2

## 2020-09-21 MED ORDER — PERFLUTREN LIPID MICROSPHERE
1.0000 mL | INTRAVENOUS | Status: AC | PRN
  Administered 2020-09-21: 6 mL via INTRAVENOUS
  Filled 2020-09-21: qty 10

## 2020-09-21 NOTE — H&P (Signed)
   Pericardial effusion, large  Systolic heart failure ef 35%, Moderate to severe LVH  80% LAD, Calcified

## 2020-09-21 NOTE — Progress Notes (Signed)
Pt. HR down to 37. RN notified of rhythm changes on tele. On call for Cardiology paged to make aware.

## 2020-09-21 NOTE — Progress Notes (Incomplete)
  Echocardiogram 2D Echocardiogram has been performed.  Corey [name] 09/21/2020, 10:19 AM

## 2020-09-21 NOTE — TOC Initial Note (Signed)
Transition of Care (TOC) - Initial/Assessment Note  Heart Failure   Patient Details  Name: Corey [name] MRN: [id number] Date of Birth: 1945/07/02  Transition of Care [facility]) CM/SW Contact:    [place], [place] Phone Number: 09/21/2020, 4:07 PM  Clinical Narrative:      CSW per RN HF Navigator spoke with patient and his wife at bedside about the Heart Failure Clinic and bridging the gap between inpatient and outpatient care. CSW completed a very brief SDOH with the patient and his wife and assessing immediate needs. The patients wife denied having social needs and reported that they wouldn't qualify for food stamps and would probably be denied. The patients wife reported that her husband needs a hearing aid and assistance with pursuing a hearing aid for her husband as well as nutritional information so that she knows what her husband can and cannot eat. CSW provided the patient and his wife with a heart failure booklet as well as with the social workers name, number and position and to reach out to the [facility] as other social needs/concerns arise.     Barriers to Discharge: Continued Medical Work up   Patient Goals and CMS Choice        Expected Discharge Plan and Services   In-house Referral: Clinical Social Work                                            Prior Living Arrangements/Services     Patient language and need for interpreter reviewed:: Yes        Need for Family Participation in Patient Care: No (Comment) Care giver support system in place?: No (comment)   Criminal Activity/Legal Involvement Pertinent to Current Situation/Hospitalization: No - Comment as needed  Activities of Daily Living Home Assistive Devices/Equipment: None ADL Screening (condition at time of admission) Patient's cognitive ability adequate to safely complete daily activities?: Yes Is the patient deaf or have difficulty hearing?: Yes Does the patient have difficulty seeing, even when  wearing glasses/contacts?: Yes Does the patient have difficulty concentrating, remembering, or making decisions?: No Patient able to express need for assistance with ADLs?: Yes Does the patient have difficulty dressing or bathing?: Yes Independently performs ADLs?: No Communication: Independent Dressing (OT): Needs assistance Is this a change from baseline?: Change from baseline, expected to last >3 days Grooming: Independent Feeding: Independent Bathing: Needs assistance Is this a change from baseline?: Change from baseline, expected to last >3 days Toileting: Needs assistance In/Out Bed: Needs assistance Walks in Home: Independent Does the patient have difficulty walking or climbing stairs?: No Weakness of Legs: None Weakness of Arms/Hands: None  Permission Sought/Granted                  Emotional Assessment Appearance:: Appears stated age Attitude/Demeanor/Rapport: Unresponsive Affect (typically observed): Quiet Orientation: : Oriented to Place,Oriented to Self,Oriented to  Time,Oriented to Situation   Psych Involvement: No (comment)  Admission diagnosis:  Acute combined systolic and diastolic heart failure ([place]) [I50.41] Patient Active Problem List   Diagnosis Date Noted  . Progressive angina ([place]) - Atypical; DOE 09/20/2020  . Coronary artery disease involving native coronary artery of native heart with angina pectoris ([place]) 09/20/2020  . Acute on chronic combined systolic and diastolic CHF (congestive heart failure) ([place]) 09/20/2020  . Acute combined systolic and diastolic heart failure ([place]) 09/20/2020  . Soft tissue mass  07/30/2020  . Contracture of joint of finger of left hand 07/30/2020  . Acute respiratory disease due to COVID-19 virus 06/05/2019  . DOE (dyspnea on exertion) 03/01/2019  . Mild cognitive impairment 12/05/2018  . Slurred speech 07/23/2018  . Muscle twitching 07/23/2018  . OSA (obstructive sleep apnea) 03/27/2018  . Snores 01/21/2018  .  Poor balance 01/23/2017  . Ankle swelling, left 09/06/2016  . Bradycardia 01/28/2016  . Weak urinary stream 12/31/2015  . Stage 3b chronic kidney disease 09/15/2015  . Ventral hernia without obstruction or gangrene 09/15/2015  . Prostate cancer ([place]) 07/25/2015  . Erectile dysfunction 05/04/2015  . Urinary frequency 05/04/2015  . 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 Abnormal finding on MRI of brain 02/14/2015  . PEA (Pulseless electrical activity) ([place]) 12/03/2012  . Type 1 diabetes mellitus with complications ([place]) 05/23/3233  . Gout 03/05/2007  . Essential hypertension 03/05/2007   PCP:  [name], MD Pharmacy:   [facility] [address]), [place] - [address] [place] [address] Phone: [phone] Fax: [phone]  [facility] [place], [place] [address] Phone: [phone] Fax: [phone]  [facility] [address] [place] [address] Phone: [phone] Fax: [phone]     Social Determinants of Health (SDOH) Interventions Food Insecurity Interventions: Intervention Not Indicated Financial Strain Interventions: Intervention Not Indicated Housing Interventions: Intervention Not Indicated Transportation Interventions: Intervention Not Indicated  Readmission Risk Interventions No flowsheet data found.  Kimani Bedoya, MSW, [place] Heart Failure Social Worker

## 2020-09-21 NOTE — Consult Note (Signed)
   [facility] Inpatient Consult   09/21/2020  Corey [name] October 16, 1945 [id number]   Mutual Organization [ACO] Patient: [employer]  Primary Care Provider:  Billey Gosling, MD, [place], an Embedded provider with Chronic Care Management program, this provider does the transition of care follow up calls and appointments.  Patient is currently active with [place] [facility] for chronic disease management services.  Patient has been engaged by a [facility].  Our community based plan of care has focused on disease management and community resource support.   Spoke with patient and wife at bedside regarding post hospital follow up needs. They agree to follow up for more information with their Embedded provider office.  Plan: Patient is Will continue to follow with  Inpatient Transition Of Care [TOC] team member to make aware that [place] [facility] following.    Of note, [facility] Care Management services does not replace or interfere with any services that are needed or arranged by inpatient [facility] care management team.  For additional questions or referrals please contact:  Natividad Brood, RN BSN [facility] Liaison  [phone] business mobile phone Toll free office [phone]  Fax number: [phone] [email or web address]

## 2020-09-21 NOTE — Plan of Care (Signed)

## 2020-09-21 NOTE — Progress Notes (Signed)
Heart Failure Nurse Navigator Progress Note  Navigation team following this hospitalization. Screening pending further testing/cardiac workup, planned cath with PCI 5/11 w Dr. Tamala Julian.   Pricilla Holm, RN, BSN Heart Failure Nurse Navigator [phone]

## 2020-09-21 NOTE — Plan of Care (Signed)
  Problem: Elimination: Goal: Will not experience complications related to urinary retention Outcome: Progressing   Problem: Pain Managment: Goal: General experience of comfort will improve Outcome: Progressing   Problem: Safety: Goal: Ability to remain free from injury will improve Outcome: Progressing   

## 2020-09-21 NOTE — Plan of Care (Signed)
  Problem: Education: Goal: Knowledge of General Education information will improve Description Including pain rating scale, medication(s)/side effects and non-pharmacologic comfort measures Outcome: Progressing   Problem: Elimination: Goal: Will not experience complications related to urinary retention Outcome: Progressing   Problem: Safety: Goal: Ability to remain free from injury will improve Outcome: Progressing   

## 2020-09-21 NOTE — Progress Notes (Signed)
Progress Note  Patient Name: Corey [name] Date of Encounter: 09/21/2020  [facility] Cardiologist: Candee Furbish, MD   Subjective   Denies CP; dyspnea improving  Inpatient Medications    Scheduled Meds: . allopurinol  50 mg Oral q AM  . amLODipine  10 mg Oral q AM  . aspirin EC  81 mg Oral q AM  . cloNIDine  0.3 mg Oral BID  . furosemide  40 mg Intravenous BID  . gabapentin  300 mg Oral QHS  . heparin  5,000 Units Subcutaneous Q8H  . hydrALAZINE  75 mg Oral TID  . insulin aspart  0-15 Units Subcutaneous TID WC  . isosorbide mononitrate  60 mg Oral BID  . sacubitril-valsartan  1 tablet Oral BID  . sodium chloride flush  3 mL Intravenous Q12H  . ticagrelor  90 mg Oral BID   Continuous Infusions: . sodium chloride     PRN Meds: sodium chloride, acetaminophen, albuterol, bismuth subsalicylate, ondansetron (ZOFRAN) IV, sodium chloride flush   Vital Signs    Vitals:   09/21/20 0030 09/21/20 0100 09/21/20 0500 09/21/20 0700  BP: (!) 132/92 (!) 145/74 105/84 (!) 152/83  Pulse: 65 (!) 58 61 61  Resp: 20  20 16   Temp: 98.5 F (36.9 C)  98.3 F (36.8 C) 98.4 F (36.9 C)  TempSrc: Oral  Oral Oral  SpO2: 96%  97% 95%  Weight:   81.8 kg   Height:        Intake/Output Summary (Last 24 hours) at 09/21/2020 0847 Last data filed at 09/21/2020 0524 Gross per 24 hour  Intake 460 ml  Output 2000 ml  Net -1540 ml   Last 3 Weights 09/21/2020 09/20/2020 09/20/2020  Weight (lbs) 180 lb 5.4 oz 184 lb 4.9 oz 185 lb  Weight (kg) 81.8 kg 83.6 kg 83.915 kg      Telemetry    Sinus with PVCs and mobitz second degree AV block- Personally Reviewed  Physical Exam   GEN: No acute distress.   Neck: No JVD Cardiac: RRR, no murmurs, rubs, or gallops.  Respiratory: Clear to auscultation bilaterally. GI: Soft, nontender, non-distended  MS: No edema; radial cath site with no hematoma Neuro:  Nonfocal  Psych: Normal affect   Labs   Chemistry Recent Labs  Lab 09/15/20 1148  09/20/20 1337 09/20/20 1355 09/20/20 1613 09/21/20 0336  NA 144 146* 146*  146*  --  143  K 4.3 3.9 3.9  4.0  --  3.5  CL 113*  --   --   --  110  CO2 22  --   --   --  24  GLUCOSE 108*  --   --   --  106*  BUN 31*  --   --   --  27*  CREATININE 1.72*  --   --  1.61* 1.55*  CALCIUM 9.9  --   --   --  9.7  PROT  --   --   --   --  5.6*  ALBUMIN  --   --   --   --  3.1*  AST  --   --   --   --  25  ALT  --   --   --   --  17  ALKPHOS  --   --   --   --  63  BILITOT  --   --   --   --  0.7  GFRNONAA  --   --   --  44* 47*  ANIONGAP  --   --   --   --  9     Hematology Recent Labs  Lab 09/15/20 1148 09/20/20 1337 09/20/20 1355 09/20/20 1613 09/21/20 0336  WBC 2.9*  --   --  2.7* 3.3*  RBC 4.31  --   --  4.69 4.73  HGB 11.7*   < > 11.9*  11.9* 12.7* 12.7*  HCT 35.0*   < > 35.0*  35.0* 40.7 40.7  MCV 81  --   --  86.8 86.0  MCH 27.1  --   --  27.1 26.8  MCHC 33.4  --   --  31.2 31.2  RDW 14.2  --   --  15.2 15.1  PLT 185  --   --  186 191   < > = values in this interval not displayed.    Radiology    CARDIAC CATHETERIZATION  Result Date: 09/20/2020  Hemodynamic findings consistent with moderate pulmonary hypertension.  LV end diastolic pressure is severely elevated.  There is no aortic valve stenosis.  Mid LAD to Dist LAD lesion is 80% stenosed.  Ramus lesion is 20% stenosed.  SUMMARY  Severe single-vessel disease with heavily calcified mid LAD 80% stenosis  Acute Combined Systolic and Diastolic Heart Failure (systolic function appears to be reduced, to conserve contrast, LV gram not performed), but LVEDP 32 mmHg and PCWP 28 mmHg.  Systemic Hypertension with Mean Arterial Pressure 123 mmHg  Secondary Pulmonary Hypertension: PAP 67/19 mmHg-mean 39 mmHg with RVP-EDP of 67/2 mmHg-15 mmHg, RVP and RAP of 14 mmHg.  Moderate to severely reduced Cardiac Output-Index: 4.04-2.09 RECOMMENDATIONS  With significant CHF, orthopnea and PND with LVEDP of 32 mmHg, patient is  not stable for LAD PCI which would likely require atherectomy.  We will admit to cardiology service for titration of cardiac meds and diuresis: Converting losartan to [facility], otherwise continue home medications.  IV Lasix 40 mg twice daily  Check 2D echo  Reassess volume status in the next 1 to 2 days to determine feasibility for staged LAD PCI (via femoral access due to extreme tortuosity of the innominate artery.)  Slight scale insulin; consider adding [place] or Earnest Rosier, MD   Patient Profile     75 y.o. male with past medical history of chronic pericardial effusion, chronic diastolic congestive heart failure, diabetes mellitus, hypertension, coronary artery disease, renal insufficiency admitted for acute on chronic diastolic congestive heart failure following cardiac catheterization.  Cardiac catheterization on May 9 showed 80% mid LAD lesion, left ventricular end-diastolic pressure 32 mmHg with pulmonary capillary wedge pressure 28 mmHg and pulmonary hypertension with PA pressure 67/19.  Patient admitted for diuresis prior to PCI of LAD.  Assessment & Plan    1 acute on chronic diastolic congestive heart failure-I/O-1540; wt 81.8 kg.  Symptoms improving.  We will continue Lasix at present dose.  Follow renal function closely.  Follow-up echocardiogram.  2 coronary artery disease-continue aspirin and Brilinta.  Add Crestor 40 mg daily.  Plan is PCI of LAD tomorrow.  The risks and benefits including myocardial infarction, CVA and death discussed and patient agrees to proceed.  We will need to follow renal function closely following procedure given baseline renal insufficiency and risk of contrast nephropathy.  3 Mobitz 1 second-degree AV block-patient denies any history of presyncope or syncope.  We will decrease clonidine from 0.3 mg twice daily to 0.1 mg twice daily and hopefully discontinue.  No indication for pacemaker.  4 hypertension-given  decreased dose of clonidine  will increase hydralazine to 100 mg 3 times daily.  Follow blood pressure and advance Entresto if needed.  5 chronic stage III kidney disease-follow renal function closely with diuresis and with cardiac catheterization.  6 history of chronic pericardial effusion-follow-up echocardiogram is pending.    For questions or updates, please contact [place] Please consult [email or web address] for contact info under        Signed, Kirk Ruths, MD  09/21/2020, 8:47 AM

## 2020-09-21 NOTE — Progress Notes (Signed)
Pt is scheduled for LAD intervention tomorrow 09/22/20 0830 with Dr. Tamala Julian. NPO at midnight. Will be femoral approach. KVO fluids only.

## 2020-09-21 NOTE — Progress Notes (Addendum)
Heart Failure Stewardship Pharmacist Progress Note   PCP: [name], MD PCP-Cardiologist: Candee Furbish, MD    HPI:  75 yo male with PMH for chronic pericardial effusion, CHF (EF 60-65% in May 2021), DM, CVA in 2014, HTN, MI, CKD3, and OSA. Presented for scheduled cardiac cath on 09/20/20 which revealed severe single-vessel disease of LAD and marginally reduced CI of 2.09. ECHO on 09/21/20 revealed worsened LVEF 35-40%, RV function moderately reduced. Planning PCI to LAD tomorrow (5/11).  Current HF Medications: IV furosemide 40 mg BID Entresto 49/51 mg BID Hydralazine 100 mg TID Isosorbide mononitrate 60 mg BID  Prior to admission HF Medications: PO furosemide 20 mg daily  Losartan 100 mg daily Hydralazine 75 mg TID Isosorbide mononitrate 60 mg BID   Pertinent Lab Values: . Serum creatinine 1.55, BUN 27, Potassium 3.5, Sodium 143  Vital Signs: . Weight: 177 lbs (admission weight: 185 lbs) . Blood pressure: 140/80s  . Heart rate: 60s   Medication Assistance / Insurance Benefits Check: Does the patient have prescription insurance?  Yes Type of insurance plan: [employer] Medicare  Does the patient qualify for medication assistance through manufacturers or grants?   Pending . Eligible grants and/or patient assistance programs: Pending . Medication assistance applications in progress: None  . Medication assistance applications approved: None Approved medication assistance renewals will be completed by: Pending  Outpatient Pharmacy:  Prior to admission outpatient pharmacy: [facility] Is the patient willing to use [facility] pharmacy at discharge? Yes Is the patient willing to transition their outpatient pharmacy to utilize a [facility] outpatient pharmacy?   Pending    Assessment: 1. Acute on chronic systolic CHF (EF 27-74%), due to ICM. NYHA class II symptoms. - Volume status improving on MD exam, weight down 8 lbs since admit, net neg 1.5L - continue IV furosemide 40  mg BID - Agree with decreasing clonidine and increasing hydralazine to 100 mg TID today - Consider stopping amlodipine and increasing Entresto to 97/103 mg BID pending clinical course post-PCI - Consider adding spironolactone and Farxiga prior to discharge pending renal function post-PCI - Hold on adding beta-blocker until post-PCI given marginal CI on RHC   Plan: 1) Medication changes recommended at this time: - Agree with decreasing clonidine and increasing hydralazine to 100 mg TID today - Consider adding Jardiance 10 mg daily  2) Patient assistance: - Delene Loll copay $45/month - Jardiance copay $45/month  - Wilder Glade copay $95/month  3)  Education  - To be completed prior to discharge  Richardine Service, PharmD, BCPS Heart Failure Stewardship Pharmacist Phone [phone]

## 2020-09-22 ENCOUNTER — Other Ambulatory Visit: Payer: Self-pay

## 2020-09-22 ENCOUNTER — Encounter (HOSPITAL_COMMUNITY)
Admission: RE | Disposition: A | Discharge status: Home / Self Care | Payer: Self-pay | Source: Home / Self Care | Attending: Cardiology

## 2020-09-22 DIAGNOSIS — I5041 Acute combined systolic (congestive) and diastolic (congestive) heart failure: Secondary | ICD-10-CM

## 2020-09-22 DIAGNOSIS — I5033 Acute on chronic diastolic (congestive) heart failure: Secondary | ICD-10-CM

## 2020-09-22 DIAGNOSIS — N183 Chronic kidney disease, stage 3 unspecified: Secondary | ICD-10-CM

## 2020-09-22 DIAGNOSIS — I313 Pericardial effusion (noninflammatory): Secondary | ICD-10-CM

## 2020-09-22 LAB — CBC
HCT: 44.7 % (ref 39.0–52.0)
Hemoglobin: 14.3 g/dL (ref 13.0–17.0)
MCH: 27.1 pg (ref 26.0–34.0)
MCHC: 32 g/dL (ref 30.0–36.0)
MCV: 84.7 fL (ref 80.0–100.0)
Platelets: 230 10*3/uL (ref 150–400)
RBC: 5.28 MIL/uL (ref 4.22–5.81)
RDW: 15 % (ref 11.5–15.5)
WBC: 3.7 10*3/uL — ABNORMAL LOW (ref 4.0–10.5)
nRBC: 0 % (ref 0.0–0.2)

## 2020-09-22 LAB — BASIC METABOLIC PANEL
Anion gap: 10 (ref 5–15)
BUN: 25 mg/dL — ABNORMAL HIGH (ref 8–23)
CO2: 25 mmol/L (ref 22–32)
Calcium: 9.4 mg/dL (ref 8.9–10.3)
Chloride: 104 mmol/L (ref 98–111)
Creatinine, Ser: 1.69 mg/dL — ABNORMAL HIGH (ref 0.61–1.24)
GFR, Estimated: 43 mL/min — ABNORMAL LOW (ref >60)
Glucose, Bld: 86 mg/dL (ref 70–99)
Potassium: 3.2 mmol/L — ABNORMAL LOW (ref 3.5–5.1)
Sodium: 139 mmol/L (ref 135–145)

## 2020-09-22 LAB — COMPREHENSIVE METABOLIC PANEL
ALT: 16 U/L (ref 0–44)
AST: 22 U/L (ref 15–41)
Albumin: 3 g/dL — ABNORMAL LOW (ref 3.5–5.0)
Alkaline Phosphatase: 61 U/L (ref 38–126)
Anion gap: 7 (ref 5–15)
BUN: 24 mg/dL — ABNORMAL HIGH (ref 8–23)
CO2: 24 mmol/L (ref 22–32)
Calcium: 9.4 mg/dL (ref 8.9–10.3)
Chloride: 108 mmol/L (ref 98–111)
Creatinine, Ser: 1.63 mg/dL — ABNORMAL HIGH (ref 0.61–1.24)
GFR, Estimated: 44 mL/min — ABNORMAL LOW (ref >60)
Glucose, Bld: 118 mg/dL — ABNORMAL HIGH (ref 70–99)
Potassium: 3.6 mmol/L (ref 3.5–5.1)
Sodium: 139 mmol/L (ref 135–145)
Total Bilirubin: 1.3 mg/dL — ABNORMAL HIGH (ref 0.3–1.2)
Total Protein: 5.4 g/dL — ABNORMAL LOW (ref 6.5–8.1)

## 2020-09-22 LAB — GLUCOSE, CAPILLARY
Glucose-Capillary: 120 mg/dL — ABNORMAL HIGH (ref 70–99)
Glucose-Capillary: 130 mg/dL — ABNORMAL HIGH (ref 70–99)
Glucose-Capillary: 150 mg/dL — ABNORMAL HIGH (ref 70–99)
Glucose-Capillary: 113 mg/dL — ABNORMAL HIGH (ref 70–99)

## 2020-09-22 SURGERY — CORONARY ATHERECTOMY
Anesthesia: LOCAL

## 2020-09-22 MED ORDER — SODIUM CHLORIDE 0.9 % IV SOLN: INTRAVENOUS | Status: DC

## 2020-09-22 MED ORDER — POTASSIUM CHLORIDE CRYS ER 20 MEQ PO TBCR
40.0000 meq | EXTENDED_RELEASE_TABLET | Freq: Once | ORAL | Status: AC
  Administered 2020-09-22: 40 meq via ORAL
  Filled 2020-09-22: qty 2

## 2020-09-22 MED ORDER — SACUBITRIL-VALSARTAN 49-51 MG PO TABS: 1.0000 | ORAL_TABLET | Freq: Two times a day (BID) | ORAL | Status: DC

## 2020-09-22 MED ORDER — SODIUM CHLORIDE 0.9 % IV SOLN: 250.0000 mL | INTRAVENOUS | Status: DC | PRN

## 2020-09-22 MED ORDER — ASPIRIN 81 MG PO CHEW
81.0000 mg | CHEWABLE_TABLET | ORAL | Status: AC
  Administered 2020-09-22: 81 mg via ORAL
  Filled 2020-09-22: qty 1

## 2020-09-22 MED ORDER — SACUBITRIL-VALSARTAN 97-103 MG PO TABS
1.0000 | ORAL_TABLET | Freq: Two times a day (BID) | ORAL | Status: DC
  Administered 2020-09-22: 1 via ORAL
  Filled 2020-09-22 (×2): qty 1

## 2020-09-22 MED ORDER — SODIUM CHLORIDE 0.9 % IV SOLN
INTRAVENOUS | Status: DC
Start: 2020-09-22 — End: 2020-09-27

## 2020-09-22 MED ORDER — SODIUM CHLORIDE 0.9% FLUSH
3.0000 mL | Freq: Two times a day (BID) | INTRAVENOUS | Status: DC
  Administered 2020-09-22 – 2020-09-27 (×9): 3 mL via INTRAVENOUS

## 2020-09-22 MED ORDER — EMPAGLIFLOZIN 10 MG PO TABS
10.0000 mg | ORAL_TABLET | Freq: Every day | ORAL | Status: DC
  Administered 2020-09-22 – 2020-09-28 (×7): 10 mg via ORAL
  Filled 2020-09-22 (×7): qty 1

## 2020-09-22 MED ORDER — ASPIRIN 81 MG PO CHEW: 81.0000 mg | CHEWABLE_TABLET | ORAL | Status: DC

## 2020-09-22 MED ORDER — SODIUM CHLORIDE 0.9% FLUSH: 3.0000 mL | INTRAVENOUS | Status: DC | PRN

## 2020-09-22 NOTE — Patient Outreach (Signed)
[facility]) Care Management  09/22/2020  Corey [name] February 17, 1946 [id number]   Case Closure    RN CM received notification from [facility] hospital liaison that patient is eligible for case mgmt services through embedded CM team at PCP office. Patient has consented and agreed to this service.    Plan: RN CM will close case at this time.  Enzo Montgomery, RN,BSN,CCM [place] [facility] Telephonic Care Management Coordinator Direct Phone: [phone] Toll Free: [phone] Fax: [phone]

## 2020-09-22 NOTE — Progress Notes (Signed)
Heart Failure Nurse Navigator Progress Note  Navigator to follow from a far as CVTS consulted for possible surgical intervention with pericardial window/biopsy for chronic pericardial effusion and questionable amyloidosis.   Navigator available for post-op resources and education. Can be re-evaluated for HV TOC if needs arise.   Pricilla Holm, RN, BSN Heart Failure Nurse Navigator [phone]

## 2020-09-22 NOTE — Plan of Care (Signed)

## 2020-09-22 NOTE — Consult Note (Signed)
[address]       [place] [address]             [phone]        Oluwanifemi M Wyndham Ardsley Medical Record #[id number] Date of Birth: 1945-06-26  Referring: No ref. provider found Primary Care: [name], MD Primary Cardiologist:Mark Marlou Porch, MD  Chief Complaint:  Shortness of breath, concern for amyloid, acute on chronic diastolic HF  History of Present Illness:    Patient is a 75 year old male we are asked to see in cardiothoracic surgical consultation for consideration of pericardial window for drainage of large pericardial effusion.  He has history of chronic pericardial effusion with associated shortness of breath which he says has been going on for several months..  Other cardiac symptoms include lower extremity edema and sinus bradycardia. He has a h/o chronic diastolic HF.  He has a previous hospital admission for hypertensive urgency.  Currently he reports dyspnea on exertion.  It has become progressively worse.  He denies chest pain, tightness or pressure.  He denies dizziness, lightheadedness, previous syncopal episode, orthopnea or PND.  An echocardiogram done yesterday shows left ventricular ejection fraction by estimation to be 35 to 40%. This was better on previous echo's.    There is noted to be a large pericardial effusion present.  It is circumferential but no evidence of cardiac tamponade.  For the full report please see below.  Cardiac catheterization was done on 09/20/2020.  Please also see that full report listed below.  He is noted to have severe single-vessel coronary artery disease of the LAD.  Findings are also consistent with moderate pulmonary hypertension.  LV end diastolic pressure is severely elevated.  He is being medically treated for congestive failure and there is a possibility that he could be a candidate for PCI in the future once this is stabilized.  He reportedly has a history of a PEA arrest in the past.  There is some question of  amyloid cardiomyopathy so in addition to the pericardial window a myocardial biopsy is being requested.  He currently has a Mobitz 1 second-degree AV block.  He also has chronic stage III kidney disease.    Current Activity/ Functional Status: Patient is not independent with mobility/ambulation, transfers, ADL's, IADL's.   Zubrod Score: At the time of surgery this patient's most appropriate activity status/level should be described as: []     0    Normal activity, no symptoms []     1    Restricted in physical strenuous activity but ambulatory, able to do out light work [x]     2    Ambulatory and capable of self care, unable to do work activities, up and about                 more than 50%  Of the time                            []     3    Only limited self care, in bed greater than 50% of waking hours []     4    Completely disabled, no self care, confined to bed or chair []     5    Moribund  Past Medical History:  Diagnosis Date  . Cataract   . Chronic heart failure with preserved ejection fraction (HFpEF) ([facility])   . Colon polyp 2003   Dr [name];  F/U was to be 2008( not completed)  . CVA (cerebral infarction) 2014  . Diabetes mellitus   . Diverticulosis 2003  . Dyspnea    with exertion  . Gout   . Hypertension   . Myocardial infarction ([facility]) 2014   ?? PEA cardiac arrest  . OSA (obstructive sleep apnea) 03/27/2018  . Pneumonia 09/29/11   Avelox X 10 days as OP  . Prostate cancer ([facility])   . Renal insufficiency   . Seizures ([facility])    not treated for seizure disorder; had a seizure after stroke 2014; no seizure since then    Past Surgical History:  Procedure Laterality Date  . COLONOSCOPY W/ POLYPECTOMY  2003   no F/U ([facility] discussed 12/03/12)  . INSERTION OF MESH N/A 07/16/2017   Procedure: INSERTION OF MESH;  Surgeon: [name], MD;  Location: [facility];  Service: General;  Laterality: N/A;  . PEG PLACEMENT  2014  . PEG TUBE REMOVAL  2014  . PROSTATE BIOPSY    .  RIGHT/LEFT HEART CATH AND CORONARY ANGIOGRAPHY N/A 09/20/2020   Procedure: RIGHT/LEFT HEART CATH AND CORONARY ANGIOGRAPHY;  Surgeon: Leonie Man, MD;  Location: [facility];  Service: Cardiovascular;  Laterality: N/A;  . TRACHEOSTOMY  2014   was closed after hospital stay  . VENTRAL HERNIA REPAIR N/A 07/16/2017   Procedure: LAPAROSCOPIC VENTRAL HERNIA REPAIR WITH MESH;  Surgeon: [name], MD;  Location: [facility];  Service: General;  Laterality: N/A;  . wrist aspiration  02/16/12    monosodium urate crystals; Dr Caralyn Guile    Social History   Tobacco Use  Smoking Status Former Smoker  . Packs/day: 0.25  . Years: 1.00  . Pack years: 0.25  . Types: Cigarettes  . Quit date: 05/15/1968  . Years since quitting: 52.3  Smokeless Tobacco Never Used  Tobacco Comment   smoked [place], up to 1-2 cigarettes/ day    Social History   Substance and Sexual Activity  Alcohol Use No  . Alcohol/week: 0.0 standard drinks     Allergies  Allergen Reactions  . Hydrochlorothiazide Other (See Comments)    Gout , uncontrolled diabetes and renal insufficiency    Current Facility-Administered Medications  Medication Dose Route Frequency Provider Last Rate Last Admin  . 0.9 %  sodium chloride infusion  250 mL Intravenous PRN Leonie Man, MD      . 0.9 %  sodium chloride infusion  250 mL Intravenous PRN Ledora Bottcher, PA      . 0.9 %  sodium chloride infusion   Intravenous Continuous Belva Crome, MD 10 mL/hr at 09/22/20 0600 New Bag at 09/22/20 0600  . acetaminophen (TYLENOL) tablet 650 mg  650 mg Oral Q4H PRN Leonie Man, MD   650 mg at 09/20/20 2057  . albuterol (PROVENTIL) (2.5 MG/3ML) 0.083% nebulizer solution 3 mL  3 mL Inhalation Q4H PRN Leonie Man, MD      . allopurinol (ZYLOPRIM) tablet 50 mg  50 mg Oral q AM Leonie Man, MD   50 mg at 09/22/20 0601  . amLODipine (NORVASC) tablet 10 mg  10 mg Oral q AM Leonie Man, MD   10 mg at 09/22/20 0602  .  aspirin EC tablet 81 mg  81 mg Oral q AM Leonie Man, MD   81 mg at 09/21/20 1610  . bismuth subsalicylate (PEPTO BISMOL) 262 MG/15ML suspension 30 mL  30 mL Oral Q6H PRN Glenetta Hew  W, MD      . empagliflozin (JARDIANCE) tablet 10 mg  10 mg Oral Daily Lelon Perla, MD   10 mg at 09/22/20 1223  . furosemide (LASIX) injection 40 mg  40 mg Intravenous BID Leonie Man, MD   40 mg at 09/22/20 0818  . gabapentin (NEURONTIN) capsule 300 mg  300 mg Oral QHS Leonie Man, MD   300 mg at 09/21/20 2113  . heparin injection 5,000 Units  5,000 Units Subcutaneous Q8H Leonie Man, MD   5,000 Units at 09/22/20 0602  . hydrALAZINE (APRESOLINE) tablet 100 mg  100 mg Oral TID Lelon Perla, MD   100 mg at 09/22/20 0818  . insulin aspart (novoLOG) injection 0-15 Units  0-15 Units Subcutaneous TID WC Leonie Man, MD   2 Units at 09/22/20 1227  . isosorbide mononitrate (IMDUR) 24 hr tablet 60 mg  60 mg Oral BID Leonie Man, MD   60 mg at 09/22/20 0818  . ondansetron (ZOFRAN) injection 4 mg  4 mg Intravenous Q6H PRN Leonie Man, MD      . rosuvastatin (CRESTOR) tablet 40 mg  40 mg Oral Daily Lelon Perla, MD   40 mg at 09/22/20 0818  . sacubitril-valsartan (ENTRESTO) 97-103 mg per tablet  1 tablet Oral BID Lelon Perla, MD      . sodium chloride flush (NS) 0.9 % injection 3 mL  3 mL Intravenous Q12H Leonie Man, MD   3 mL at 09/22/20 0820  . sodium chloride flush (NS) 0.9 % injection 3 mL  3 mL Intravenous PRN Leonie Man, MD      . sodium chloride flush (NS) 0.9 % injection 3 mL  3 mL Intravenous Q12H [facility], Tami Lin, PA      . sodium chloride flush (NS) 0.9 % injection 3 mL  3 mL Intravenous PRN Ledora Bottcher, PA        Medications Prior to Admission  Medication Sig Dispense Refill Last Dose  . albuterol (VENTOLIN HFA) 108 (90 Base) MCG/ACT inhaler Inhale 2 puffs into the lungs every 4 (four) hours as needed for wheezing or shortness of  breath. 18 g 1 Past Month at Unknown time  . allopurinol (ZYLOPRIM) 100 MG tablet Take 0.5 tablets (50 mg total) by mouth daily. (Patient taking differently: Take 50 mg by mouth in the morning.) 45 tablet 1 09/20/2020 at Unknown time  . amLODipine (NORVASC) 10 MG tablet Take 1 tablet (10 mg total) by mouth daily. (Patient taking differently: Take 10 mg by mouth in the morning.) 90 tablet 1 09/20/2020 at Unknown time  . aspirin EC 81 MG tablet Take 1 tablet (81 mg total) by mouth daily. (Patient taking differently: Take 81 mg by mouth in the morning.)   09/20/2020 at 0630  . bismuth subsalicylate (PEPTO BISMOL) 262 MG/15ML suspension Take 30 mLs by mouth every 6 (six) hours as needed for indigestion.   Past Week at Unknown time  . gabapentin (NEURONTIN) 300 MG capsule Take 1 capsule (300 mg total) by mouth at bedtime. 30 capsule 0 09/20/2020 at Unknown time  . glimepiride (AMARYL) 1 MG tablet TAKE 1/2 TABLET AT BEDTIME (Patient taking differently: Take 0.5 mg by mouth at bedtime.) 45 tablet 1 09/19/2020 at Unknown time  . hydrALAZINE (APRESOLINE) 50 MG tablet TAKE 1 AND 1/2 TABLETS THREE TIMES DAILY (Patient taking differently: Take 75 mg by mouth 3 (three) times daily.) 405 tablet 3 09/20/2020 at  Unknown time  . isosorbide mononitrate (IMDUR) 60 MG 24 hr tablet TAKE 1 TABLET TWICE DAILY (FOLLOW-UP APPOINTMENT IS DUE. MUST SEE PROVIDER FOR FUTURE REFILLS) (Patient taking differently: Take 60 mg by mouth in the morning and at bedtime.) 180 tablet 1 09/19/2020 at Unknown time  . losartan (COZAAR) 100 MG tablet TAKE 1 TABLET EVERY DAY (Patient taking differently: Take 100 mg by mouth daily.) 90 tablet 1 09/20/2020 at Unknown time  . metFORMIN (GLUCOPHAGE) 500 MG tablet Take 1 tablet (500 mg total) by mouth 2 (two) times daily with a meal. (Patient taking differently: Take 500 mg by mouth at bedtime.) 180 tablet 3 09/19/2020 at Unknown time  . naproxen sodium (ALEVE) 220 MG tablet Take 220 mg by mouth daily as needed (headache,  mild pain).   Past Month at Unknown time  . cloNIDine (CATAPRES) 0.3 MG tablet TAKE 1 TABLET TWICE DAILY (Patient taking differently: Take 0.3 mg by mouth 2 (two) times daily.) 180 tablet 1 Unknown at Unknown time  . colchicine (COLCRYS) 0.6 MG tablet Take 2 tabs once and then one tab one hour later as needed for gout (Patient taking differently: Take 0.6-1.2 mg by mouth See admin instructions. Take 2 tabs once and then one tab one hour later as needed for gout) 15 tablet 2 Unknown at Unknown time  . furosemide (LASIX) 20 MG tablet TAKE 1 TABLET EVERY MORNING (Patient taking differently: Take 20 mg by mouth daily with breakfast.) 90 tablet 0 Unknown at Unknown time    Family History  Problem Relation Age of Onset  . Hypertension Father   . Prostate cancer Father   . Diabetes Father   . Cancer Father   . Hypertension Mother   . Diabetes Mother   . Stroke Neg Hx   . Heart disease Neg Hx   . Colon cancer Neg Hx      Review of Systems:   ROS Pertinent items are noted in HPI.          Physical Exam: BP (!) 153/88 (BP Location: Right Arm)   Pulse 71   Temp 98.2 F (36.8 C) (Oral)   Resp 18   Ht 5\' 6"  (1.676 m)   Wt 77.5 kg   SpO2 95%   BMI 27.58 kg/m    General appearance: alert, cooperative and no distress Head: Normocephalic, without obvious abnormality, atraumatic Neck: no adenopathy, no carotid bruit, no JVD, supple, symmetrical, trachea midline and thyroid not enlarged, symmetric, no tenderness/mass/nodules Lymph nodes: Cervical, supraclavicular, and axillary nodes normal. Resp: clear to auscultation bilaterally Back: symmetric, no curvature. ROM normal. No CVA tenderness. Cardio: regular rate and rhythm, no rub and no murmur GI: soft, non-tender; bowel sounds normal; no masses,  no organomegaly Genitalia: defer exam Extremities: no edema Neurologic: Grossly normal  Diagnostic Studies & Laboratory data:     Recent Radiology Findings:   CARDIAC  CATHETERIZATION  Result Date: 09/20/2020  Hemodynamic findings consistent with moderate pulmonary hypertension.  LV end diastolic pressure is severely elevated.  There is no aortic valve stenosis.  Mid LAD to Dist LAD lesion is 80% stenosed.  Ramus lesion is 20% stenosed.  SUMMARY  Severe single-vessel disease with heavily calcified mid LAD 80% stenosis  Acute Combined Systolic and Diastolic Heart Failure (systolic function appears to be reduced, to conserve contrast, LV gram not performed), but LVEDP 32 mmHg and PCWP 28 mmHg.  Systemic Hypertension with Mean Arterial Pressure 123 mmHg  Secondary Pulmonary Hypertension: PAP 67/19 mmHg-mean 39 mmHg with  RVP-EDP of 67/2 mmHg-15 mmHg, RVP and RAP of 14 mmHg.  Moderate to severely reduced Cardiac Output-Index: 4.04-2.09 RECOMMENDATIONS  With significant CHF, orthopnea and PND with LVEDP of 32 mmHg, patient is not stable for LAD PCI which would likely require atherectomy.  We will admit to cardiology service for titration of cardiac meds and diuresis: Converting losartan to [facility], otherwise continue home medications.  IV Lasix 40 mg twice daily  Check 2D echo  Reassess volume status in the next 1 to 2 days to determine feasibility for staged LAD PCI (via femoral access due to extreme tortuosity of the innominate artery.)  Slight scale insulin; consider adding Farxiga or Earnest Rosier, MD  ECHOCARDIOGRAM COMPLETE  Result Date: 09/21/2020    ECHOCARDIOGRAM REPORT   Patient Name:   Corey [name] Date of Exam: 09/21/2020 Medical Rec #:  [id number]    Height:       66.0 in Accession #:    [id number]   Weight:       180.3 lb Date of Birth:  July 11, 1945    BSA:          1.914 m Patient Age:    63 years     BP:           152/83 mmHg Patient Gender: M            HR:           61 bpm. Exam Location:  Inpatient Procedure: 2D Echo, Cardiac Doppler and Color Doppler Indications:    CAD Native Vessel  History:        Patient has prior history of  Echocardiogram examinations, most                 recent 10/10/2019. Cardiomyopathy and Hypertrophic                 Cardiomyopathy, CAD, Stroke; Risk Factors:Former Smoker.  Sonographer:    Cammy Brochure Referring Phys: [place]  1. There is no left ventricular thrombus with Definity contrast. Left ventricular ejection fraction, by estimation, is 35 to 40%. The left ventricle has moderately decreased function. The left ventricle demonstrates regional wall motion abnormalities (see scoring diagram/findings for description). There is global left ventricular hypokinesis, disproportionately severe in the mid-apical anterior and anterolateral walls and apex. The anterior septum has relatively preserved contractility. There is severe concentric left ventricular hypertrophy. Left ventricular diastolic parameters are consistent with Grade II diastolic dysfunction (pseudonormalization). Elevated left atrial pressure.  2. Right ventricular systolic function is moderately reduced. The right ventricular size is normal. Mildly increased right ventricular wall thickness. There is moderately elevated pulmonary artery systolic pressure.  3. Left atrial size was severely dilated.  4. Right atrial size was mildly dilated.  5. Large pericardial effusion. The pericardial effusion is circumferential. There is no evidence of cardiac tamponade.  6. The mitral valve is normal in structure. Mild mitral valve regurgitation.  7. The aortic valve is tricuspid. Aortic valve regurgitation is not visualized. No aortic stenosis is present.  8. The inferior vena cava is dilated in size with <50% respiratory variability, suggesting right atrial pressure of 15 mmHg. Comparison(s): The left ventricular function is significantly worse. The left ventricular wall motion abnormality is worse. The pericardial effusion is slightly larger. There are no overt findings to support tamponade, other than inferior vena cava dilation.  However, the sensitivity of echo for the diagnosis of pericardial tamponade is diminished in the setting of pulmonary  artery hypertension. FINDINGS  Left Ventricle: There is no left ventricular thrombus with Definity contrast. Left ventricular ejection fraction, by estimation, is 35 to 40%. The left ventricle has moderately decreased function. The left ventricle demonstrates regional wall motion abnormalities. The left ventricular internal cavity size was normal in size. There is severe concentric left ventricular hypertrophy. Left ventricular diastolic parameters are consistent with Grade II diastolic dysfunction (pseudonormalization). Elevated  left atrial pressure.  LV Wall Scoring: The mid and distal anterior wall, apical lateral segment, mid anterolateral segment, and apex are hypokinetic. There is global left ventricular hypokinesis, disproportionately severe in the mid-apical anterior and anterolateral walls and apex. The anterior septum has relatively preserved contractility. Right Ventricle: The right ventricular size is normal. Mildly increased right ventricular wall thickness. Right ventricular systolic function is moderately reduced. There is moderately elevated pulmonary artery systolic pressure. The tricuspid regurgitant velocity is 2.75 m/s, and with an assumed right atrial pressure of 15 mmHg, the estimated right ventricular systolic pressure is 91.4 mmHg. Left Atrium: Left atrial size was severely dilated. Right Atrium: Right atrial size was mildly dilated. Pericardium: A large pericardial effusion is present. The pericardial effusion is circumferential. There is no evidence of cardiac tamponade. Mitral Valve: The mitral valve is normal in structure. Mild mitral valve regurgitation. Tricuspid Valve: The tricuspid valve is normal in structure. Tricuspid valve regurgitation is trivial. Aortic Valve: The aortic valve is tricuspid. Aortic valve regurgitation is not visualized. No aortic stenosis is  present. Aortic valve mean gradient measures 4.0 mmHg. Aortic valve peak gradient measures 6.6 mmHg. Aortic valve area, by VTI measures 2.82 cm. Pulmonic Valve: The pulmonic valve was normal in structure. Pulmonic valve regurgitation is mild. Aorta: The aortic root and ascending aorta are structurally normal, with no evidence of dilitation. Venous: The inferior vena cava is dilated in size with less than 50% respiratory variability, suggesting right atrial pressure of 15 mmHg. IAS/Shunts: No atrial level shunt detected by color flow Doppler.  LEFT VENTRICLE PLAX 2D LVIDd:         4.90 cm  Diastology LVIDs:         4.00 cm  LV e' medial:    3.81 cm/s LV PW:         2.10 cm  LV E/e' medial:  33.1 LV IVS:        2.20 cm  LV e' lateral:   4.79 cm/s LVOT diam:     2.10 cm  LV E/e' lateral: 26.3 LV SV:         62 LV SV Index:   32 LVOT Area:     3.46 cm  RIGHT VENTRICLE RV Basal diam:  3.20 cm RV S prime:     8.27 cm/s LEFT ATRIUM              Index       RIGHT ATRIUM           Index LA diam:        4.60 cm  2.40 cm/m  RA Area:     20.80 cm LA Vol (A2C):   101.0 ml 52.78 ml/m RA Volume:   62.00 ml  32.40 ml/m LA Vol (A4C):   83.5 ml  43.63 ml/m LA Biplane Vol: 98.1 ml  51.26 ml/m  AORTIC VALVE AV Area (Vmax):    2.90 cm AV Area (Vmean):   2.62 cm AV Area (VTI):     2.82 cm AV Vmax:  128.00 cm/s AV Vmean:          94.300 cm/s AV VTI:            0.219 m AV Peak Grad:      6.6 mmHg AV Mean Grad:      4.0 mmHg LVOT Vmax:         107.00 cm/s LVOT Vmean:        71.300 cm/s LVOT VTI:          0.178 m LVOT/AV VTI ratio: 0.81  AORTA Ao Root diam: 3.60 cm Ao Asc diam:  3.70 cm MITRAL VALVE                TRICUSPID VALVE MV Area (PHT): 3.99 cm     TR Peak grad:   30.3 mmHg MV Decel Time: 190 msec     TR Vmax:        275.09 cm/s MV E velocity: 126.00 cm/s MV A velocity: 36.50 cm/s   SHUNTS MV E/A ratio:  3.45         Systemic VTI:  0.18 m                             Systemic Diam: 2.10 cm Dani Gobble Croitoru MD  Electronically signed by Sanda Klein MD Signature Date/Time: 09/21/2020/11:24:42 AM    Final      I have independently reviewed the above radiologic studies and discussed with the patient   Recent Lab Findings: Lab Results  Component Value Date   WBC 3.7 (L) 09/22/2020   HGB 14.3 09/22/2020   HCT 44.7 09/22/2020   PLT 230 09/22/2020   GLUCOSE 86 09/22/2020   CHOL 125 01/28/2020   TRIG 55 01/28/2020   HDL 50 01/28/2020   LDLCALC 61 01/28/2020   ALT 16 09/22/2020   AST 22 09/22/2020   NA 139 09/22/2020   K 3.2 (L) 09/22/2020   CL 104 09/22/2020   CREATININE 1.69 (H) 09/22/2020   BUN 25 (H) 09/22/2020   CO2 25 09/22/2020   TSH 0.243 (L) 06/06/2019   INR 1.0 06/05/2019   HGBA1C 6.4 (H) 09/21/2020      Assessment / Plan:  Large pericardial effusion- query amyloid  Plan: pericardial window/biopsy        I  spent 30 minutes counseling the patient face to face.   John Giovanni, PA-C 09/22/2020 12:50 PM

## 2020-09-22 NOTE — Progress Notes (Signed)
Heart Failure Stewardship Pharmacist Progress Note   PCP: [name], MD PCP-Cardiologist: Candee Furbish, MD    HPI:  75 yo male with PMH for chronic pericardial effusion, CHF (EF 60-65% in May 2021), DM, CVA in 2014, HTN, MI, CKD3, and OSA. Presented for scheduled cardiac cath on 09/20/20 which revealed severe single-vessel disease of LAD and marginally reduced CI of 2.09. ECHO on 09/21/20 revealed worsened LVEF 35-40%, RV function moderately reduced. Pericardial effusion enlarged, appearance of myocardium concerning for potential amyloidosis. Plans noted to consult CVTS for pericardial window with myocardial biopsy. For PCI to LAD in the future.  Current HF Medications: IV furosemide 40 mg BID Entresto 49/51 mg BID Hydralazine 100 mg TID Isosorbide mononitrate 60 mg BID  Prior to admission HF Medications: PO furosemide 20 mg daily  Losartan 100 mg daily Hydralazine 75 mg TID Isosorbide mononitrate 60 mg BID   Pertinent Lab Values: . Serum creatinine 1.63, BUN 24, Potassium 3.6, Sodium 139  Vital Signs: . Weight: 170 lbs (admission weight: 185 lbs) . Blood pressure: 150/80s  . Heart rate: 60s   Medication Assistance / Insurance Benefits Check: Does the patient have prescription insurance?  Yes Type of insurance plan: [employer]  Does the patient qualify for medication assistance through manufacturers or grants?   Pending . Eligible grants and/or patient assistance programs: Pending . Medication assistance applications in progress: None  . Medication assistance applications approved: None Approved medication assistance renewals will be completed by: Pending  Outpatient Pharmacy:  Prior to admission outpatient pharmacy: [facility] Is the patient willing to use [facility] pharmacy at discharge? Yes Is the patient willing to transition their outpatient pharmacy to utilize a [facility] outpatient pharmacy?   Pending    Assessment: 1. Acute on chronic systolic  CHF (EF 45-85%), due to ICM. NYHA class II symptoms. - Volume status and dyspnea improving but positive JVD on MD exam, weight down 15 lbs since admit, net neg 6.1L with 5.5L uop in past 24 hrs - continue IV furosemide 40 mg BID - Consider stopping amlodipine and clonidine and further increasing Entresto to 97/103 mg BID  - Continue hydralazine 100 mg TID and isosorbide mononitrate 60 mg BID - Consider adding spironolactone and Farxiga prior to discharge pending renal function post-PCI - Hold on adding beta-blocker given marginal CI and Mobitz 1 second-degree AV block.   Plan: 1) Medication changes recommended at this time: - Add Jardiance 10 mg daily - Increase Entresto to 97/103 mg BID  2) Patient assistance: - Delene Loll copay $45/month - Jardiance copay $45/month  - Wilder Glade copay $95/month  3)  Education  - To be completed prior to discharge  Richardine Service, PharmD, BCPS Heart Failure Stewardship Pharmacist Phone [phone]

## 2020-09-22 NOTE — Progress Notes (Signed)
Progress Note  Patient Name: Corey [name] Date of Encounter: 09/22/2020  [facility] Cardiologist: Candee Furbish, MD   Subjective   Dyspnea improving; no chest pain  Inpatient Medications    Scheduled Meds: . allopurinol  50 mg Oral q AM  . amLODipine  10 mg Oral q AM  . aspirin EC  81 mg Oral q AM  . cloNIDine  0.1 mg Oral BID  . furosemide  40 mg Intravenous BID  . gabapentin  300 mg Oral QHS  . heparin  5,000 Units Subcutaneous Q8H  . hydrALAZINE  100 mg Oral TID  . insulin aspart  0-15 Units Subcutaneous TID WC  . isosorbide mononitrate  60 mg Oral BID  . rosuvastatin  40 mg Oral Daily  . sacubitril-valsartan  1 tablet Oral BID  . sodium chloride flush  3 mL Intravenous Q12H  . sodium chloride flush  3 mL Intravenous Q12H  . ticagrelor  90 mg Oral BID   Continuous Infusions: . sodium chloride    . sodium chloride    . sodium chloride 10 mL/hr at 09/22/20 0600   PRN Meds: sodium chloride, sodium chloride, acetaminophen, albuterol, bismuth subsalicylate, ondansetron (ZOFRAN) IV, sodium chloride flush, sodium chloride flush   Vital Signs    Vitals:   09/22/20 0500 09/22/20 0600 09/22/20 0700 09/22/20 0800  BP:    (!) 153/88  Pulse: 77 74 78 71  Resp: (!) 32 (!) 21 (!) 32 18  Temp:    98.2 F (36.8 C)  TempSrc:    Oral  SpO2: 95% 95% 96% 95%  Weight:      Height:        Intake/Output Summary (Last 24 hours) at 09/22/2020 0944 Last data filed at 09/22/2020 0746 Gross per 24 hour  Intake 580 ml  Output 4875 ml  Net -4295 ml   Last 3 Weights 09/22/2020 09/21/2020 09/21/2020  Weight (lbs) 170 lb 13.7 oz 177 lb 0.5 oz 180 lb 5.4 oz  Weight (kg) 77.5 kg 80.3 kg 81.8 kg      Telemetry     Sinus with PVCs and intermittent mobitz 1 second degree AV block- Personally Reviewed   Physical Exam   GEN: No acute distress.   Neck: positive JVD Cardiac: RRR Respiratory: Clear to auscultation bilaterally. GI: Soft, nontender, non-distended  MS: No  edema Neuro:  Nonfocal  Psych: Normal affect   Labs     Chemistry Recent Labs  Lab 09/15/20 1148 09/20/20 1337 09/20/20 1355 09/20/20 1613 09/21/20 0336 09/22/20 0214  NA 144   < > 146*  146*  --  143 139  K 4.3   < > 3.9  4.0  --  3.5 3.6  CL 113*  --   --   --  110 108  CO2 22  --   --   --  24 24  GLUCOSE 108*  --   --   --  106* 118*  BUN 31*  --   --   --  27* 24*  CREATININE 1.72*  --   --  1.61* 1.55* 1.63*  CALCIUM 9.9  --   --   --  9.7 9.4  PROT  --   --   --   --  5.6* 5.4*  ALBUMIN  --   --   --   --  3.1* 3.0*  AST  --   --   --   --  25 22  ALT  --   --   --   --  17 16  ALKPHOS  --   --   --   --  63 61  BILITOT  --   --   --   --  0.7 1.3*  GFRNONAA  --   --   --  45* 47* 44*  ANIONGAP  --   --   --   --  9 7   < > = values in this interval not displayed.     Hematology Recent Labs  Lab 09/20/20 1613 09/21/20 0336 09/22/20 0214  WBC 2.7* 3.3* 3.7*  RBC 4.69 4.73 5.28  HGB 12.7* 12.7* 14.3  HCT 40.7 40.7 44.7  MCV 86.8 86.0 84.7  MCH 27.1 26.8 27.1  MCHC 31.2 31.2 32.0  RDW 15.2 15.1 15.0  PLT 186 191 230    Radiology    CARDIAC CATHETERIZATION  Result Date: 09/20/2020  Hemodynamic findings consistent with moderate pulmonary hypertension.  LV end diastolic pressure is severely elevated.  There is no aortic valve stenosis.  Mid LAD to Dist LAD lesion is 80% stenosed.  Ramus lesion is 20% stenosed.  SUMMARY  Severe single-vessel disease with heavily calcified mid LAD 80% stenosis  Acute Combined Systolic and Diastolic Heart Failure (systolic function appears to be reduced, to conserve contrast, LV gram not performed), but LVEDP 32 mmHg and PCWP 28 mmHg.  Systemic Hypertension with Mean Arterial Pressure 123 mmHg  Secondary Pulmonary Hypertension: PAP 67/19 mmHg-mean 39 mmHg with RVP-EDP of 67/2 mmHg-15 mmHg, RVP and RAP of 14 mmHg.  Moderate to severely reduced Cardiac Output-Index: 4.04-2.09 RECOMMENDATIONS  With significant CHF,  orthopnea and PND with LVEDP of 32 mmHg, patient is not stable for LAD PCI which would likely require atherectomy.  We will admit to cardiology service for titration of cardiac meds and diuresis: Converting losartan to [facility], otherwise continue home medications.  IV Lasix 40 mg twice daily  Check 2D echo  Reassess volume status in the next 1 to 2 days to determine feasibility for staged LAD PCI (via femoral access due to extreme tortuosity of the innominate artery.)  Slight scale insulin; consider adding Farxiga or Earnest Rosier, MD  ECHOCARDIOGRAM COMPLETE  Result Date: 09/21/2020    ECHOCARDIOGRAM REPORT   Patient Name:   Corey [name] Date of Exam: 09/21/2020 Medical Rec #:  [id number]    Height:       66.0 in Accession #:    [id number]   Weight:       180.3 lb Date of Birth:  05/26/1945    BSA:          1.914 m Patient Age:    75 years     BP:           152/83 mmHg Patient Gender: M            HR:           61 bpm. Exam Location:  Inpatient Procedure: 2D Echo, Cardiac Doppler and Color Doppler Indications:    CAD Native Vessel  History:        Patient has prior history of Echocardiogram examinations, most                 recent 10/10/2019. Cardiomyopathy and Hypertrophic                 Cardiomyopathy, CAD, Stroke; Risk Factors:Former Smoker.  Sonographer:    Cammy Brochure Referring Phys: [place]  1. There is no left ventricular thrombus with  Definity contrast. Left ventricular ejection fraction, by estimation, is 35 to 40%. The left ventricle has moderately decreased function. The left ventricle demonstrates regional wall motion abnormalities (see scoring diagram/findings for description). There is global left ventricular hypokinesis, disproportionately severe in the mid-apical anterior and anterolateral walls and apex. The anterior septum has relatively preserved contractility. There is severe concentric left ventricular hypertrophy. Left ventricular diastolic  parameters are consistent with Grade II diastolic dysfunction (pseudonormalization). Elevated left atrial pressure.  2. Right ventricular systolic function is moderately reduced. The right ventricular size is normal. Mildly increased right ventricular wall thickness. There is moderately elevated pulmonary artery systolic pressure.  3. Left atrial size was severely dilated.  4. Right atrial size was mildly dilated.  5. Large pericardial effusion. The pericardial effusion is circumferential. There is no evidence of cardiac tamponade.  6. The mitral valve is normal in structure. Mild mitral valve regurgitation.  7. The aortic valve is tricuspid. Aortic valve regurgitation is not visualized. No aortic stenosis is present.  8. The inferior vena cava is dilated in size with <50% respiratory variability, suggesting right atrial pressure of 15 mmHg. Comparison(s): The left ventricular function is significantly worse. The left ventricular wall motion abnormality is worse. The pericardial effusion is slightly larger. There are no overt findings to support tamponade, other than inferior vena cava dilation. However, the sensitivity of echo for the diagnosis of pericardial tamponade is diminished in the setting of pulmonary artery hypertension. FINDINGS  Left Ventricle: There is no left ventricular thrombus with Definity contrast. Left ventricular ejection fraction, by estimation, is 35 to 40%. The left ventricle has moderately decreased function. The left ventricle demonstrates regional wall motion abnormalities. The left ventricular internal cavity size was normal in size. There is severe concentric left ventricular hypertrophy. Left ventricular diastolic parameters are consistent with Grade II diastolic dysfunction (pseudonormalization). Elevated  left atrial pressure.  LV Wall Scoring: The mid and distal anterior wall, apical lateral segment, mid anterolateral segment, and apex are hypokinetic. There is global left  ventricular hypokinesis, disproportionately severe in the mid-apical anterior and anterolateral walls and apex. The anterior septum has relatively preserved contractility. Right Ventricle: The right ventricular size is normal. Mildly increased right ventricular wall thickness. Right ventricular systolic function is moderately reduced. There is moderately elevated pulmonary artery systolic pressure. The tricuspid regurgitant velocity is 2.75 m/s, and with an assumed right atrial pressure of 15 mmHg, the estimated right ventricular systolic pressure is 12.2 mmHg. Left Atrium: Left atrial size was severely dilated. Right Atrium: Right atrial size was mildly dilated. Pericardium: A large pericardial effusion is present. The pericardial effusion is circumferential. There is no evidence of cardiac tamponade. Mitral Valve: The mitral valve is normal in structure. Mild mitral valve regurgitation. Tricuspid Valve: The tricuspid valve is normal in structure. Tricuspid valve regurgitation is trivial. Aortic Valve: The aortic valve is tricuspid. Aortic valve regurgitation is not visualized. No aortic stenosis is present. Aortic valve mean gradient measures 4.0 mmHg. Aortic valve peak gradient measures 6.6 mmHg. Aortic valve area, by VTI measures 2.82 cm. Pulmonic Valve: The pulmonic valve was normal in structure. Pulmonic valve regurgitation is mild. Aorta: The aortic root and ascending aorta are structurally normal, with no evidence of dilitation. Venous: The inferior vena cava is dilated in size with less than 50% respiratory variability, suggesting right atrial pressure of 15 mmHg. IAS/Shunts: No atrial level shunt detected by color flow Doppler.  LEFT VENTRICLE PLAX 2D LVIDd:         4.90  cm  Diastology LVIDs:         4.00 cm  LV e' medial:    3.81 cm/s LV PW:         2.10 cm  LV E/e' medial:  33.1 LV IVS:        2.20 cm  LV e' lateral:   4.79 cm/s LVOT diam:     2.10 cm  LV E/e' lateral: 26.3 LV SV:         62 LV SV  Index:   32 LVOT Area:     3.46 cm  RIGHT VENTRICLE RV Basal diam:  3.20 cm RV S prime:     8.27 cm/s LEFT ATRIUM              Index       RIGHT ATRIUM           Index LA diam:        4.60 cm  2.40 cm/m  RA Area:     20.80 cm LA Vol (A2C):   101.0 ml 52.78 ml/m RA Volume:   62.00 ml  32.40 ml/m LA Vol (A4C):   83.5 ml  43.63 ml/m LA Biplane Vol: 98.1 ml  51.26 ml/m  AORTIC VALVE AV Area (Vmax):    2.90 cm AV Area (Vmean):   2.62 cm AV Area (VTI):     2.82 cm AV Vmax:           128.00 cm/s AV Vmean:          94.300 cm/s AV VTI:            0.219 m AV Peak Grad:      6.6 mmHg AV Mean Grad:      4.0 mmHg LVOT Vmax:         107.00 cm/s LVOT Vmean:        71.300 cm/s LVOT VTI:          0.178 m LVOT/AV VTI ratio: 0.81  AORTA Ao Root diam: 3.60 cm Ao Asc diam:  3.70 cm MITRAL VALVE                TRICUSPID VALVE MV Area (PHT): 3.99 cm     TR Peak grad:   30.3 mmHg MV Decel Time: 190 msec     TR Vmax:        275.09 cm/s MV E velocity: 126.00 cm/s MV A velocity: 36.50 cm/s   SHUNTS MV E/A ratio:  3.45         Systemic VTI:  0.18 m                             Systemic Diam: 2.10 cm Mihai Croitoru MD Electronically signed by Sanda Klein MD Signature Date/Time: 09/21/2020/11:24:42 AM    Final     Patient Profile     75 y.o. male with past medical history of chronic pericardial effusion, chronic diastolic congestive heart failure, diabetes mellitus, hypertension, coronary artery disease, renal insufficiency admitted for acute on chronic diastolic congestive heart failure following cardiac catheterization.  Cardiac catheterization on May 9 showed 80% mid LAD lesion, left ventricular end-diastolic pressure 32 mmHg with pulmonary capillary wedge pressure 28 mmHg and pulmonary hypertension with PA pressure 67/19.  Patient admitted for diuresis prior to PCI of LAD.  Echocardiogram repeated this admission and shows newly reduced LV function with ejection fraction 35 to 40%, severe left ventricular hypertrophy, grade 2  diastolic  dysfunction, moderate RV dysfunction, moderate pulmonary hypertension, biatrial enlargement, large pericardial effusion, mild mitral regurgitation.  Myocardium with somewhat speckled appearance and by my review is concerning for potential amyloidosis.  Assessment & Plan    1 acute on chronic diastolic congestive heart failure-I/O-4605; wt 77.5 kg.   Symptoms are improving.  We will continue Lasix at present dose.  Follow renal function.  2 cardiomyopathy-LV function newly reduced.  Myocardium with question appearance of amyloid.  Will add Entresto 49/51 twice daily.  Increase as tolerated.  Given Mobitz 1 second-degree AV block will not add beta-blockade.  I have discussed patient with Dr. Tamala Julian.  Patient's predominant symptoms are CHF and not chest pain.  Echocardiogram shows newly reduced LV function and large pericardial effusion.  He also has conduction abnormalities and above is concerning for amyloid.  We feel best option is pericardial window with myocardial biopsy.  We will ask CVTS to review.  2 coronary artery disease-continue aspirin and discontinue Brilinta.  Continue statin.    Can consider PCI in the future once CHF improves.  3 Mobitz 1 second-degree AV block-patient denies any history of presyncope or syncope.    Discontinue clonidine and follow.  4 hypertension-blood pressure is increasing.  I am weaning clonidine off given Mobitz 1 second-degree AV block.  Begin Entresto and advance as needed.  Would like to wean amlodipine to off if possible given reduced LV function.  5 chronic stage III kidney disease-follow renal function.   6 history of chronic pericardial effusion-pericardial effusion is chronic but has enlarged.  I am also concerned about the potential for amyloid.  Feel best option is pericardial window with myocardial biopsy as outlined above.  For questions or updates, please contact Melissa Please consult [email or web address] for contact info under         Signed, Kirk Ruths, MD  09/22/2020, 9:44 AM

## 2020-09-23 DIAGNOSIS — I5041 Acute combined systolic (congestive) and diastolic (congestive) heart failure: Secondary | ICD-10-CM | POA: Diagnosis not present

## 2020-09-23 LAB — COMPREHENSIVE METABOLIC PANEL
ALT: 16 U/L (ref 0–44)
AST: 25 U/L (ref 15–41)
Albumin: 3.3 g/dL — ABNORMAL LOW (ref 3.5–5.0)
Alkaline Phosphatase: 67 U/L (ref 38–126)
Anion gap: 8 (ref 5–15)
BUN: 29 mg/dL — ABNORMAL HIGH (ref 8–23)
CO2: 26 mmol/L (ref 22–32)
Calcium: 9.7 mg/dL (ref 8.9–10.3)
Chloride: 105 mmol/L (ref 98–111)
Creatinine, Ser: 2.12 mg/dL — ABNORMAL HIGH (ref 0.61–1.24)
GFR, Estimated: 32 mL/min — ABNORMAL LOW (ref >60)
Glucose, Bld: 147 mg/dL — ABNORMAL HIGH (ref 70–99)
Potassium: 4.5 mmol/L (ref 3.5–5.1)
Sodium: 139 mmol/L (ref 135–145)
Total Bilirubin: 1.3 mg/dL — ABNORMAL HIGH (ref 0.3–1.2)
Total Protein: 6.2 g/dL — ABNORMAL LOW (ref 6.5–8.1)

## 2020-09-23 LAB — GLUCOSE, CAPILLARY
Glucose-Capillary: 135 mg/dL — ABNORMAL HIGH (ref 70–99)
Glucose-Capillary: 233 mg/dL — ABNORMAL HIGH (ref 70–99)
Glucose-Capillary: 90 mg/dL (ref 70–99)
Glucose-Capillary: 107 mg/dL — ABNORMAL HIGH (ref 70–99)

## 2020-09-23 MED ORDER — SACUBITRIL-VALSARTAN 49-51 MG PO TABS
1.0000 | ORAL_TABLET | Freq: Two times a day (BID) | ORAL | Status: DC
  Administered 2020-09-23 (×2): 1 via ORAL
  Filled 2020-09-23 (×2): qty 1

## 2020-09-23 MED ORDER — SENNOSIDES-DOCUSATE SODIUM 8.6-50 MG PO TABS
2.0000 | ORAL_TABLET | Freq: Two times a day (BID) | ORAL | Status: DC | PRN
  Administered 2020-09-23 – 2020-09-30 (×3): 2 via ORAL
  Filled 2020-09-23 (×3): qty 2

## 2020-09-23 MED ORDER — FUROSEMIDE 10 MG/ML IJ SOLN: 40.0000 mg | Freq: Every day | INTRAMUSCULAR | Status: DC

## 2020-09-23 MED ORDER — SENNOSIDES-DOCUSATE SODIUM 8.6-50 MG PO TABS: 2.0000 | ORAL_TABLET | Freq: Two times a day (BID) | ORAL | Status: DC

## 2020-09-23 MED ORDER — HYDRALAZINE HCL 50 MG PO TABS
50.0000 mg | ORAL_TABLET | Freq: Three times a day (TID) | ORAL | Status: DC
  Administered 2020-09-23 – 2020-09-25 (×7): 50 mg via ORAL
  Filled 2020-09-23 (×7): qty 1

## 2020-09-23 MED ORDER — ISOSORBIDE MONONITRATE ER 60 MG PO TB24
60.0000 mg | ORAL_TABLET | Freq: Every day | ORAL | Status: DC
  Administered 2020-09-23 – 2020-09-26 (×4): 60 mg via ORAL
  Filled 2020-09-23 (×4): qty 1

## 2020-09-23 NOTE — Care Management Important Message (Signed)
Important Message  Patient Details  Name: Corey [name] MRN: [id number] Date of Birth: 04-14-1946   Medicare Important Message Given:  Yes     Orbie Pyo 09/23/2020, 2:31 PM

## 2020-09-23 NOTE — Progress Notes (Addendum)
Heart Failure Stewardship Pharmacist Progress Note   PCP: [name], MD PCP-Cardiologist: Candee Furbish, MD    HPI:  75 yo male with PMH for chronic pericardial effusion, CHF (EF 60-65% in May 2021), DM, CVA in 2014, HTN, MI, CKD3, and OSA. Presented for scheduled cardiac cath on 09/20/20 which revealed severe single-vessel disease of LAD and marginally reduced CI of 2.09. ECHO on 09/21/20 revealed worsened LVEF 35-40%, RV function moderately reduced. Pericardial effusion enlarged, appearance of myocardium concerning for potential amyloidosis. Plans noted to consult CVTS for pericardial window with myocardial biopsy. Plan for medical management of CAD rather than PCI unless he becomes symptomatic again.  Current HF Medications: Entresto 49/51 mg BID Jardiance 10 mg daily  Hydralazine 50 mg TID Isosorbide mononitrate 60 mg daily  Prior to admission HF Medications: PO furosemide 20 mg daily  Losartan 100 mg daily Hydralazine 75 mg TID Isosorbide mononitrate 60 mg BID   Pertinent Lab Values: . Serum creatinine 2.12 (inc from 1.69), BUN 29, Potassium 4.5, Sodium 139  Vital Signs: . Weight: 175 lbs (admission weight: 185 lbs) . Blood pressure: 120-130s/80s . Heart rate: 70s  Medication Assistance / Insurance Benefits Check: Does the patient have prescription insurance?  Yes Type of insurance plan: [employer]  Does the patient qualify for medication assistance through manufacturers or grants?   Pending . Eligible grants and/or patient assistance programs: Pending . Medication assistance applications in progress: None  . Medication assistance applications approved: None Approved medication assistance renewals will be completed by: Pending  Outpatient Pharmacy:  Prior to admission outpatient pharmacy: [facility] Is the patient willing to use [facility] pharmacy at discharge? Yes Is the patient willing to transition their outpatient pharmacy to utilize a [facility]  outpatient pharmacy?   Pending    Assessment: 1. Acute on chronic systolic CHF (EF 37-85%), due to ICM. NYHA class II symptoms. - Dyspnea improved, no edema on MD exam, urine output continues to be appropriate. Significant bump in Scr to 2.12 (from 1.69) - agree with holding lasix today, consider resuming tomorrow. - Consider stopping amlodipine and further increasing Entresto to 97/103 mg BID  - Continue hydralazine 50 mg TID - Continue isosorbide mononitrate 60 mg daily - Consider adding spironolactone and Farxiga prior to discharge pending renal function improvement - Hold on adding beta-blocker given marginal CI and Mobitz 1 second-degree AV block.   Plan: 1) Medication changes recommended at this time: - Stop amlodipine, and increase Entresto to 97/103 mg BID if BP allows  2) Patient assistance: Delene Loll copay $45/month - Jardiance copay $45/month  - Wilder Glade copay $95/month  3)  Education  - To be completed prior to discharge  Harriet Pho, PharmD PGY-1 Community Pharmacy Resident   Kerby Nora, PharmD, BCPS Heart Failure Stewardship Pharmacist Phone [phone]  Please check [email or web address] for unit-specific pharmacist phone numbers

## 2020-09-23 NOTE — Evaluation (Signed)
Physical Therapy Evaluation Patient Details Name: Corey [name] MRN: [id number] DOB: 1946-01-20 Today's Date: 09/23/2020   History of Present Illness  75 y.o. male presents to [facility] hospital on 09/20/2020 with reports of progressive SOB. Pt underwent heart cath on 5/9 showed 80% mid LAD lesion. Pt admitted for diuresis. Possible plan for pericardial window and biopsy due to chronic pericardial effusion. PMH includes CHF, CVA, DOE, HTN, Gout, MI, OSA, PNA, seizures.  Clinical Impression  Pt demonstrates ability to perform sit>stand transfers without physical assistance. Pt able to tolerate ambulation of community distances with RW and without use of an AD. Pt tolerates dynamic gait challenges, presenting with difficult some difficulty in clearing LE with stepping over obstacles. Pt experiences 2 instances of LOB, once needing physical assistance to correct. Pt demonstrates deficits in strength, endurance, power, and balance and will benefit from acute PT to improve independent mobility and safety. SPT recommends outpatient PT to aid in improving dynamic balance for safe mobility.    Follow Up Recommendations Outpatient PT    Equipment Recommendations  None recommended by PT    Recommendations for Other Services       Precautions / Restrictions Precautions Precautions: Fall Restrictions Weight Bearing Restrictions: No      Mobility  Bed Mobility               General bed mobility comments: Pt received in recliner.    Transfers Overall transfer level: Modified independent Equipment used: Rolling walker (2 wheeled)                Ambulation/Gait Ambulation/Gait assistance: Min guard Gait Distance (Feet): 525 Feet Assistive device: Rolling walker (2 wheeled);None Gait Pattern/deviations: Step-through pattern;Decreased stride length Gait velocity: decreased Gait velocity interpretation: 1.31 - 2.62 ft/sec, indicative of limited community ambulator General Gait Details: Pt  performs gait with RW initally, then with no AD. Pt tolerates dynamic gait challenges including scanning and stepping around/over obstacles. 2 LOB noted during gait.  Stairs            Wheelchair Mobility    Modified Rankin (Stroke Patients Only)       Balance Overall balance assessment: Needs assistance Sitting-balance support: No upper extremity supported;Feet supported Sitting balance-Leahy Scale: Fair     Standing balance support: No upper extremity supported;Bilateral upper extremity supported;During functional activity Standing balance-Leahy Scale: Good Standing balance comment: Pt accepts dynamic gait challenges without UE support and no AD during gait with mild instability noted.                             Pertinent Vitals/Pain Pain Assessment: No/denies pain    Home Living Family/patient expects to be discharged to:: Private residence Living Arrangements: Spouse/significant other Available Help at Discharge: Family;Available 24 hours/day Type of Home: House Home Access: Stairs to enter Entrance Stairs-Rails: None Entrance Stairs-Number of Steps: 1 Home Layout: One level Home Equipment: None      Prior Function Level of Independence: Independent         Comments: Pt reports walking around 3 miles per day.     Hand Dominance        Extremity/Trunk Assessment   Upper Extremity Assessment Upper Extremity Assessment: Overall WFL for tasks assessed    Lower Extremity Assessment Lower Extremity Assessment: Generalized weakness    Cervical / Trunk Assessment Cervical / Trunk Assessment: Normal  Communication   Communication: No difficulties  Cognition Arousal/Alertness: Awake/alert Behavior During Therapy: [facility]  for tasks assessed/performed Overall Cognitive Status: Within Functional Limits for tasks assessed                                        General Comments General comments (skin integrity, edema, etc.):  VSS on RA.    Exercises     Assessment/Plan    PT Assessment Patient needs continued PT services  PT Problem List Decreased strength;Decreased balance;Decreased mobility;Decreased coordination       PT Treatment Interventions Gait training;Functional mobility training;Therapeutic activities;Therapeutic exercise;Balance training;Patient/family education    PT Goals (Current goals can be found in the Care Plan section)  Acute Rehab PT Goals Patient Stated Goal: Return home. PT Goal Formulation: With patient Time For Goal Achievement: 10/07/20 Potential to Achieve Goals: Good Additional Goals Additional Goal #1: Pt will score > 19/24 on dynamic gait index to demonstrate improved balance for independent dynamic gait activties.    Frequency Min 3X/week   Barriers to discharge        Co-evaluation               AM-PAC PT "6 Clicks" Mobility  Outcome Measure Help needed turning from your back to your side while in a flat bed without using bedrails?: None Help needed moving from lying on your back to sitting on the side of a flat bed without using bedrails?: None Help needed moving to and from a bed to a chair (including a wheelchair)?: None Help needed standing up from a chair using your arms (e.g., wheelchair or bedside chair)?: None Help needed to walk in hospital room?: A Little Help needed climbing 3-5 steps with a railing? : A Little 6 Click Score: 22    End of Session Equipment Utilized During Treatment: Gait belt Activity Tolerance: Patient tolerated treatment well Patient left: in chair;with call bell/phone within reach;with family/visitor present Nurse Communication: Mobility status PT Visit Diagnosis: Unsteadiness on feet (R26.81);Muscle weakness (generalized) (M62.81);Other abnormalities of gait and mobility (R26.89)    Time: 4496-7591 PT Time Calculation (min) (ACUTE ONLY): 21 min   Charges:   PT Evaluation $PT Eval Low Complexity: 1 Low           Acute Rehab  Pager: [phone]   Garwin Brothers, SPT  09/23/2020, 12:37 PM

## 2020-09-23 NOTE — Progress Notes (Signed)
Heart Failure Nurse Navigator Progress Note  PCP: [name], MD PCP-Cardiologist: Derl Barrow., MD Admission Diagnosis: chest pain Admitted from: home with spouse  Presentation:   [name] presented with SOB, chronic pericardial effusion, BLE edema, SB. Assisted pt to bed with spouse. Pt on room air resting in bed with HOB at 35. Introduced position to pt, explained plan for potential follow up with HV TOC for quick hospital follow up s/p discharge. Planned pericardial window Monday 5/16 per CVTS. Plan to see pt post op Tuesday/Wednesday to re-evaluate needs. Spouse drives, reliable vehicle.    ECHO/ LVEF: 35-40% 09/2019: 60-65%  Clinical Course:  Past Medical History:  Diagnosis Date  . Cataract   . Chronic heart failure with preserved ejection fraction (HFpEF) ([place])   . Colon polyp 2003   Dr [name]; F/U was to be 2008( not completed)  . CVA (cerebral infarction) 2014  . Diabetes mellitus   . Diverticulosis 2003  . Dyspnea    with exertion  . Gout   . Hypertension   . Myocardial infarction ([place]) 2014   ?? PEA cardiac arrest  . OSA (obstructive sleep apnea) 03/27/2018  . Pneumonia 09/29/11   Avelox X 10 days as OP  . Prostate cancer ([place])   . Renal insufficiency   . Seizures ([place])    not treated for seizure disorder; had a seizure after stroke 2014; no seizure since then     Social History   Socioeconomic History  . Marital status: Married    Spouse name: [name]  . Number of children: 2  . Years of education: 62  . Highest education level: 12th grade  Occupational History  . Not on file  Tobacco Use  . Smoking status: Former Smoker    Packs/day: 0.25    Years: 1.00    Pack years: 0.25    Types: Cigarettes    Quit date: 05/15/1968    Years since quitting: 52.3  . Smokeless tobacco: Never Used  . Tobacco comment: smoked [place], up to 1-2 cigarettes/ day  Vaping Use  . Vaping Use: Never used  Substance and Sexual Activity  . Alcohol use: No     Alcohol/week: 0.0 standard drinks  . Drug use: No  . Sexual activity: Yes  Other Topics Concern  . Not on file  Social History Narrative   Walks three times a week, 2-3 miles, occasional walks on treadmill       Patient is right-handed. He lives with his wife in a one level home. He drinks 1 diet Mt. Dew a day and rarely has coffee. He walks 3 x a week for exercise.   Social Determinants of Health   Financial Resource Strain: Low Risk   . Difficulty of Paying Living Expenses: Not very hard  Food Insecurity: No Food Insecurity  . Worried About Charity fundraiser in the Last Year: Never true  . Ran Out of Food in the Last Year: Never true  Transportation Needs: No Transportation Needs  . Lack of Transportation (Medical): No  . Lack of Transportation (Non-Medical): No  Physical Activity: Not on file  Stress: Not on file  Social Connections: Not on file    High Risk Criteria for Readmission and/or Poor Patient Outcomes:  Heart failure hospital admissions (last 6 months): 1   No Show rate: 4%  Difficult social situation: no  Demonstrates medication adherence: yes  Primary Language: English  Literacy level: able to read/write and comprehend. Pt HOH.  Barriers of Care:   -hearing difficulty -medication costs  Considerations/Referrals:   Referral made to Heart Failure Pharmacist Stewardship: yes, appreciated Referral made to Heart & Vascular TOC clinic: pending  Items for Follow-up on DC/TOC: -continued medication compliance -medication cost  Pricilla Holm, RN, BSN Heart Failure Nurse Navigator [phone]

## 2020-09-23 NOTE — Progress Notes (Signed)
Progress Note  Patient Name: Corey [name] Date of Encounter: 09/23/2020  [facility] Cardiologist: Candee Furbish, MD   Subjective   Dyspnea continues to improve; no CP  Inpatient Medications    Scheduled Meds: . allopurinol  50 mg Oral q AM  . amLODipine  10 mg Oral q AM  . aspirin EC  81 mg Oral q AM  . empagliflozin  10 mg Oral Daily  . furosemide  40 mg Intravenous BID  . gabapentin  300 mg Oral QHS  . heparin  5,000 Units Subcutaneous Q8H  . hydrALAZINE  100 mg Oral TID  . insulin aspart  0-15 Units Subcutaneous TID WC  . isosorbide mononitrate  60 mg Oral BID  . rosuvastatin  40 mg Oral Daily  . sacubitril-valsartan  1 tablet Oral BID  . sodium chloride flush  3 mL Intravenous Q12H  . sodium chloride flush  3 mL Intravenous Q12H   Continuous Infusions: . sodium chloride    . sodium chloride    . sodium chloride Stopped (09/22/20 1223)   PRN Meds: sodium chloride, sodium chloride, acetaminophen, albuterol, bismuth subsalicylate, ondansetron (ZOFRAN) IV, sodium chloride flush, sodium chloride flush   Vital Signs    Vitals:   09/22/20 1600 09/22/20 1952 09/23/20 0524 09/23/20 0823  BP: (!) 138/115 124/88    Pulse: 78 73    Resp: 18 12    Temp: 98.3 F (36.8 C) 97.8 F (36.6 C) 97.7 F (36.5 C) 98.3 F (36.8 C)  TempSrc: Oral Oral Oral Oral  SpO2: 99% 94% 92%   Weight:   79.6 kg   Height:        Intake/Output Summary (Last 24 hours) at 09/23/2020 0843 Last data filed at 09/23/2020 0531 Gross per 24 hour  Intake 846 ml  Output 2000 ml  Net -1154 ml   Last 3 Weights 09/23/2020 09/22/2020 09/21/2020  Weight (lbs) 175 lb 7.8 oz 170 lb 13.7 oz 177 lb 0.5 oz  Weight (kg) 79.6 kg 77.5 kg 80.3 kg      Telemetry     Sinus with PVCs and intermittent mobitz 1 second degree AV block (Mobitz 1 second-degree AV block less frequent compared to previous)- Personally Reviewed   Physical Exam   GEN: No acute distress.  Well-developed well-nourished Neck:   Supple Cardiac: RRR, no rub Respiratory: Clear to auscultation bilaterally, no wheeze GI: Soft, NT/ND MS: No edema Neuro:  Grossly intact Psych: Normal affect   Labs     Chemistry Recent Labs  Lab 09/21/20 0336 09/22/20 0214 09/22/20 0800 09/23/20 0343  NA 143 139 139 139  K 3.5 3.6 3.2* 4.5  CL 110 108 104 105  CO2 24 24 25 26   GLUCOSE 106* 118* 86 147*  BUN 27* 24* 25* 29*  CREATININE 1.55* 1.63* 1.69* 2.12*  CALCIUM 9.7 9.4 9.4 9.7  PROT 5.6* 5.4*  --  6.2*  ALBUMIN 3.1* 3.0*  --  3.3*  AST 25 22  --  25  ALT 17 16  --  16  ALKPHOS 63 61  --  67  BILITOT 0.7 1.3*  --  1.3*  GFRNONAA 47* 44* 43* 32*  ANIONGAP 9 7 10 8      Hematology Recent Labs  Lab 09/20/20 1613 09/21/20 0336 09/22/20 0214  WBC 2.7* 3.3* 3.7*  RBC 4.69 4.73 5.28  HGB 12.7* 12.7* 14.3  HCT 40.7 40.7 44.7  MCV 86.8 86.0 84.7  MCH 27.1 26.8 27.1  MCHC 31.2 31.2  32.0  RDW 15.2 15.1 15.0  PLT 186 191 230    Radiology    ECHOCARDIOGRAM COMPLETE  Result Date: 09/21/2020    ECHOCARDIOGRAM REPORT   Patient Name:   BIAGIO SNELSON Date of Exam: 09/21/2020 Medical Rec #:  [id number]    Height:       66.0 in Accession #:    [id number]   Weight:       180.3 lb Date of Birth:  [birth year]    BSA:          1.914 m Patient Age:    32 years     BP:           152/83 mmHg Patient Gender: M            HR:           61 bpm. Exam Location:  Inpatient Procedure: 2D Echo, Cardiac Doppler and Color Doppler Indications:    CAD Native Vessel  History:        Patient has prior history of Echocardiogram examinations, most                 recent 10/10/2019. Cardiomyopathy and Hypertrophic                 Cardiomyopathy, CAD, Stroke; Risk Factors:Former Smoker.  Sonographer:    Cammy Brochure Referring Phys: [place]  1. There is no left ventricular thrombus with Definity contrast. Left ventricular ejection fraction, by estimation, is 35 to 40%. The left ventricle has moderately decreased function.  The left ventricle demonstrates regional wall motion abnormalities (see scoring diagram/findings for description). There is global left ventricular hypokinesis, disproportionately severe in the mid-apical anterior and anterolateral walls and apex. The anterior septum has relatively preserved contractility. There is severe concentric left ventricular hypertrophy. Left ventricular diastolic parameters are consistent with Grade II diastolic dysfunction (pseudonormalization). Elevated left atrial pressure.  2. Right ventricular systolic function is moderately reduced. The right ventricular size is normal. Mildly increased right ventricular wall thickness. There is moderately elevated pulmonary artery systolic pressure.  3. Left atrial size was severely dilated.  4. Right atrial size was mildly dilated.  5. Large pericardial effusion. The pericardial effusion is circumferential. There is no evidence of cardiac tamponade.  6. The mitral valve is normal in structure. Mild mitral valve regurgitation.  7. The aortic valve is tricuspid. Aortic valve regurgitation is not visualized. No aortic stenosis is present.  8. The inferior vena cava is dilated in size with <50% respiratory variability, suggesting right atrial pressure of 15 mmHg. Comparison(s): The left ventricular function is significantly worse. The left ventricular wall motion abnormality is worse. The pericardial effusion is slightly larger. There are no overt findings to support tamponade, other than inferior vena cava dilation. However, the sensitivity of echo for the diagnosis of pericardial tamponade is diminished in the setting of pulmonary artery hypertension. FINDINGS  Left Ventricle: There is no left ventricular thrombus with Definity contrast. Left ventricular ejection fraction, by estimation, is 35 to 40%. The left ventricle has moderately decreased function. The left ventricle demonstrates regional wall motion abnormalities. The left ventricular internal  cavity size was normal in size. There is severe concentric left ventricular hypertrophy. Left ventricular diastolic parameters are consistent with Grade II diastolic dysfunction (pseudonormalization). Elevated  left atrial pressure.  LV Wall Scoring: The mid and distal anterior wall, apical lateral segment, mid anterolateral segment, and apex are hypokinetic. There is global left ventricular hypokinesis, disproportionately  severe in the mid-apical anterior and anterolateral walls and apex. The anterior septum has relatively preserved contractility. Right Ventricle: The right ventricular size is normal. Mildly increased right ventricular wall thickness. Right ventricular systolic function is moderately reduced. There is moderately elevated pulmonary artery systolic pressure. The tricuspid regurgitant velocity is 2.75 m/s, and with an assumed right atrial pressure of 15 mmHg, the estimated right ventricular systolic pressure is 28.7 mmHg. Left Atrium: Left atrial size was severely dilated. Right Atrium: Right atrial size was mildly dilated. Pericardium: A large pericardial effusion is present. The pericardial effusion is circumferential. There is no evidence of cardiac tamponade. Mitral Valve: The mitral valve is normal in structure. Mild mitral valve regurgitation. Tricuspid Valve: The tricuspid valve is normal in structure. Tricuspid valve regurgitation is trivial. Aortic Valve: The aortic valve is tricuspid. Aortic valve regurgitation is not visualized. No aortic stenosis is present. Aortic valve mean gradient measures 4.0 mmHg. Aortic valve peak gradient measures 6.6 mmHg. Aortic valve area, by VTI measures 2.82 cm. Pulmonic Valve: The pulmonic valve was normal in structure. Pulmonic valve regurgitation is mild. Aorta: The aortic root and ascending aorta are structurally normal, with no evidence of dilitation. Venous: The inferior vena cava is dilated in size with less than 50% respiratory variability, suggesting  right atrial pressure of 15 mmHg. IAS/Shunts: No atrial level shunt detected by color flow Doppler.  LEFT VENTRICLE PLAX 2D LVIDd:         4.90 cm  Diastology LVIDs:         4.00 cm  LV e' medial:    3.81 cm/s LV PW:         2.10 cm  LV E/e' medial:  33.1 LV IVS:        2.20 cm  LV e' lateral:   4.79 cm/s LVOT diam:     2.10 cm  LV E/e' lateral: 26.3 LV SV:         62 LV SV Index:   32 LVOT Area:     3.46 cm  RIGHT VENTRICLE RV Basal diam:  3.20 cm RV S prime:     8.27 cm/s LEFT ATRIUM              Index       RIGHT ATRIUM           Index LA diam:        4.60 cm  2.40 cm/m  RA Area:     20.80 cm LA Vol (A2C):   101.0 ml 52.78 ml/m RA Volume:   62.00 ml  32.40 ml/m LA Vol (A4C):   83.5 ml  43.63 ml/m LA Biplane Vol: 98.1 ml  51.26 ml/m  AORTIC VALVE AV Area (Vmax):    2.90 cm AV Area (Vmean):   2.62 cm AV Area (VTI):     2.82 cm AV Vmax:           128.00 cm/s AV Vmean:          94.300 cm/s AV VTI:            0.219 m AV Peak Grad:      6.6 mmHg AV Mean Grad:      4.0 mmHg LVOT Vmax:         107.00 cm/s LVOT Vmean:        71.300 cm/s LVOT VTI:          0.178 m LVOT/AV VTI ratio: 0.81  AORTA Ao Root diam: 3.60 cm Ao Asc diam:  3.70 cm MITRAL VALVE                TRICUSPID VALVE MV Area (PHT): 3.99 cm     TR Peak grad:   30.3 mmHg MV Decel Time: 190 msec     TR Vmax:        275.09 cm/s MV E velocity: 126.00 cm/s MV A velocity: 36.50 cm/s   SHUNTS MV E/A ratio:  3.45         Systemic VTI:  0.18 m                             Systemic Diam: 2.10 cm Mihai Croitoru MD Electronically signed by Sanda Klein MD Signature Date/Time: 09/21/2020/11:24:42 AM    Final     Patient Profile     75 y.o. male with past medical history of chronic pericardial effusion, chronic diastolic congestive heart failure, diabetes mellitus, hypertension, coronary artery disease, renal insufficiency admitted for acute on chronic diastolic congestive heart failure following cardiac catheterization.  Cardiac catheterization on May 9  showed 80% mid LAD lesion, left ventricular end-diastolic pressure 32 mmHg with pulmonary capillary wedge pressure 28 mmHg and pulmonary hypertension with PA pressure 67/19.  Patient admitted for diuresis prior to PCI of LAD.  Echocardiogram repeated this admission and shows newly reduced LV function with ejection fraction 35 to 40%, severe left ventricular hypertrophy, grade 2 diastolic dysfunction, moderate RV dysfunction, moderate pulmonary hypertension, biatrial enlargement, large pericardial effusion, mild mitral regurgitation.  Myocardium with somewhat speckled appearance and by my review is concerning for potential amyloidosis.  Assessment & Plan    1 acute on chronic diastolic congestive heart failure-I/O-1154; wt 79.6 kg.   Symptoms continue to improve.  Creatinine has increased today.  I will hold Lasix today and likely resume lower dose tomorrow.  2 cardiomyopathy-LV function newly reduced.  Myocardium with question appearance of amyloid.  Continue Entresto (decrease to 49/51 BID as BP trending down), hydralazine and nitrates. Given Mobitz 1 second-degree AV block will not add beta-blockade.  Previously discussed with Dr. Tamala Julian.  Patient's predominant symptoms are CHF and not chest pain.  Echocardiogram shows newly reduced LV function and large pericardial effusion.  He also has conduction abnormalities and above is concerning for amyloid.  We feel best option is pericardial window with myocardial biopsy.  We are waiting final CVTS consult.  Pericardial window and myocardial biopsy could potentially be done as an outpatient if there are scheduling difficulties.  2 coronary artery disease-continue aspirin and statin.  Plan is medical therapy unless patient develops chest pain.  3 Mobitz 1 second-clonidine has been discontinued and there has been some improvement in the frequency of Mobitz 1 second-degree AV block.  Continue to follow on telemetry.  4 hypertension-blood pressure trending down  and creatinine slightly increased today.  We will discontinue amlodipine, decrease hydralazine to 50 mg 3 times daily, decrease Imdur to 60 mg daily to allow higher pressure to perfuse kidneys.  Entresto dose also reduced.  5 chronic stage III kidney disease-creatinine mildly increased today.  Changes as above.  Continue to follow.  6 history of chronic pericardial effusion-pericardial effusion is chronic but has enlarged.  I am also concerned about the potential for amyloid.  Feel best option is pericardial window with myocardial biopsy as outlined above.  For questions or updates, please contact [place] Please consult [email or web address] for contact info under        Signed,  Kirk Ruths, MD  09/23/2020, 8:43 AM

## 2020-09-24 DIAGNOSIS — I5041 Acute combined systolic (congestive) and diastolic (congestive) heart failure: Secondary | ICD-10-CM | POA: Diagnosis not present

## 2020-09-24 LAB — BASIC METABOLIC PANEL
Anion gap: 6 (ref 5–15)
BUN: 38 mg/dL — ABNORMAL HIGH (ref 8–23)
CO2: 28 mmol/L (ref 22–32)
Calcium: 9.5 mg/dL (ref 8.9–10.3)
Chloride: 106 mmol/L (ref 98–111)
Creatinine, Ser: 2.48 mg/dL — ABNORMAL HIGH (ref 0.61–1.24)
GFR, Estimated: 27 mL/min — ABNORMAL LOW (ref >60)
Glucose, Bld: 135 mg/dL — ABNORMAL HIGH (ref 70–99)
Potassium: 4.4 mmol/L (ref 3.5–5.1)
Sodium: 140 mmol/L (ref 135–145)

## 2020-09-24 LAB — GLUCOSE, CAPILLARY
Glucose-Capillary: 126 mg/dL — ABNORMAL HIGH (ref 70–99)
Glucose-Capillary: 188 mg/dL — ABNORMAL HIGH (ref 70–99)
Glucose-Capillary: 136 mg/dL — ABNORMAL HIGH (ref 70–99)
Glucose-Capillary: 99 mg/dL (ref 70–99)

## 2020-09-24 NOTE — Progress Notes (Addendum)
Heart Failure Stewardship Pharmacist Progress Note   PCP: [name], MD PCP-Cardiologist: Candee Furbish, MD    HPI:  75 yo male with PMH for chronic pericardial effusion, CHF (EF 60-65% in May 2021), DM, CVA in 2014, HTN, MI, CKD3, and OSA. Presented for scheduled cardiac cath on 09/20/20 which revealed severe single-vessel disease of LAD and marginally reduced CI of 2.09. ECHO on 09/21/20 revealed worsened LVEF 35-40%, RV function moderately reduced. Pericardial effusion enlarged, appearance of myocardium concerning for potential amyloidosis. Pericardial window scheduled for Monday (5/16) PM. Plan for medical management of CAD rather than PCI unless he becomes symptomatic again.  Current HF Medications: Entresto 49/51 mg BID Jardiance 10 mg daily  Hydralazine 50 mg TID Isosorbide mononitrate 60 mg daily  Prior to admission HF Medications: PO furosemide 20 mg daily  Losartan 100 mg daily Hydralazine 75 mg TID Isosorbide mononitrate 60 mg BID   Pertinent Lab Values: . Serum creatinine 2.48 (inc from 2.12), BUN 38, Potassium 4.4, Sodium 140  Vital Signs: . Weight: 165 lbs (admission weight: 185 lbs) . Blood pressure: 100/80s . Heart rate: 70s  Medication Assistance / Insurance Benefits Check: Does the patient have prescription insurance?  Yes Type of insurance plan: [employer] Medicare  Does the patient qualify for medication assistance through manufacturers or grants?   Pending . Eligible grants and/or patient assistance programs: Pending . Medication assistance applications in progress: None  . Medication assistance applications approved: None Approved medication assistance renewals will be completed by: Pending  Outpatient Pharmacy:  Prior to admission outpatient pharmacy: [facility] Is the patient willing to use [facility] pharmacy at discharge? Yes Is the patient willing to transition their outpatient pharmacy to utilize a [facility] outpatient pharmacy?   Pending     Assessment: 1. Acute on chronic systolic CHF (EF 11-17%), due to ICM. NYHA class II symptoms. - Scr continues to rise 2.48 from 2.12 after holding diuretics yesterday, weight down 10 lbs but documented UOP less (unsure if this is accurate) - continue holding diuretics - Agree with holding Entresto for now given worsening renal function. Consider restarting once renal function improves. - Continue Jardiance 10 mg daily - Continue hydralazine 50 mg TID - Continue isosorbide mononitrate 60 mg daily - Hold on adding spironolactone given worsening renal function - Hold on adding beta-blocker given marginal CI and Mobitz 1 second-degree AV block.   Plan: 1) Medication changes recommended at this time: - Agree with holding Entresto given renal function. Consider restarting once renal function improves.  2) Patient assistance: - Delene Loll copay $45/month - Jardiance copay $45/month  - Wilder Glade copay $95/month  3)  Education  - To be completed prior to discharge  Richardine Service, PharmD, [place] PGY2 Cardiology Pharmacy Resident Phone: [phone] 09/24/2020  8:54 AM  Please check [email or web address] for unit-specific pharmacy phone numbers.

## 2020-09-24 NOTE — Progress Notes (Signed)
Notified of VT.  Strip appears to show 8 beats.  Patient asymptomatic and VSS. MD notified.

## 2020-09-24 NOTE — TOC Progression Note (Addendum)
Transition of Care [facility]) - Progression Note    Patient Details  Name: Corey [name] MRN: [id number] Date of Birth: 04-05-1946  [facility] CM/SW Contact  Zenon Mayo, RN Phone Number: 09/24/2020, 9:08 AM  Clinical Narrative:    NCM spoke with patient, he states he will have surgery on Monday, but concenring outpatient physical therapy he would like to go to [address]. Will make referral thru epic after surgery next week for outpatient physical therapy at [facility] facility if still appropriate.     Barriers to Discharge: Continued Medical Work up  Expected Discharge Plan and Services   In-house Referral: Clinical Social Work                                             Social Determinants of Health (SDOH) Interventions Food Insecurity Interventions: Intervention Not Indicated Financial Strain Interventions: Intervention Not Indicated Housing Interventions: Intervention Not Indicated Transportation Interventions: Intervention Not Indicated  Readmission Risk Interventions No flowsheet data found.

## 2020-09-24 NOTE — Progress Notes (Signed)
Progress Note  Patient Name: STEFANO TRULSON Date of Encounter: 09/24/2020  [facility] Cardiologist: Candee Furbish, MD   Subjective   No CP or dyspnea  Inpatient Medications    Scheduled Meds: . allopurinol  50 mg Oral q AM  . aspirin EC  81 mg Oral q AM  . empagliflozin  10 mg Oral Daily  . gabapentin  300 mg Oral QHS  . heparin  5,000 Units Subcutaneous Q8H  . hydrALAZINE  50 mg Oral TID  . insulin aspart  0-15 Units Subcutaneous TID WC  . isosorbide mononitrate  60 mg Oral Daily  . rosuvastatin  40 mg Oral Daily  . sacubitril-valsartan  1 tablet Oral BID  . sodium chloride flush  3 mL Intravenous Q12H  . sodium chloride flush  3 mL Intravenous Q12H   Continuous Infusions: . sodium chloride    . sodium chloride    . sodium chloride Stopped (09/22/20 1223)   PRN Meds: sodium chloride, sodium chloride, acetaminophen, albuterol, bismuth subsalicylate, ondansetron (ZOFRAN) IV, senna-docusate, sodium chloride flush, sodium chloride flush   Vital Signs    Vitals:   09/23/20 1555 09/23/20 2000 09/24/20 0000 09/24/20 0548  BP:  133/85 109/89 (!) 150/71  Pulse: 68  81 67  Resp:  15 18 13   Temp: 98.9 F (37.2 C) 98.1 F (36.7 C)  98.2 F (36.8 C)  TempSrc: Oral Oral  Oral  SpO2:  98% 93% 91%  Weight:    75 kg  Height:        Intake/Output Summary (Last 24 hours) at 09/24/2020 0859 Last data filed at 09/24/2020 0600 Gross per 24 hour  Intake 1633.63 ml  Output 625 ml  Net 1008.63 ml   Last 3 Weights 09/24/2020 09/23/2020 09/22/2020  Weight (lbs) 165 lb 5.5 oz 175 lb 7.8 oz 170 lb 13.7 oz  Weight (kg) 75 kg 79.6 kg 77.5 kg      Telemetry     Sinus with PVCs and 8 beats NSVT; no further mobitz 1- Personally Reviewed   Physical Exam   GEN: WD WN No acute distress.  Neck:  Supple, no JVD Cardiac: RRR Respiratory: CTA GI: Soft, NT/ND, no masses MS: No edema Neuro:  No focal findings Psych: Normal affect   Labs     Chemistry Recent Labs  Lab  09/21/20 0336 09/22/20 0214 09/22/20 0800 09/23/20 0343 09/24/20 0347  NA 143 139 139 139 140  K 3.5 3.6 3.2* 4.5 4.4  CL 110 108 104 105 106  CO2 24 24 25 26 28   GLUCOSE 106* 118* 86 147* 135*  BUN 27* 24* 25* 29* 38*  CREATININE 1.55* 1.63* 1.69* 2.12* 2.48*  CALCIUM 9.7 9.4 9.4 9.7 9.5  PROT 5.6* 5.4*  --  6.2*  --   ALBUMIN 3.1* 3.0*  --  3.3*  --   AST 25 22  --  25  --   ALT 17 16  --  16  --   ALKPHOS 63 61  --  67  --   BILITOT 0.7 1.3*  --  1.3*  --   GFRNONAA 47* 44* 43* 32* 27*  ANIONGAP 9 7 10 8 6      Hematology Recent Labs  Lab 09/20/20 1613 09/21/20 0336 09/22/20 0214  WBC 2.7* 3.3* 3.7*  RBC 4.69 4.73 5.28  HGB 12.7* 12.7* 14.3  HCT 40.7 40.7 44.7  MCV 86.8 86.0 84.7  MCH 27.1 26.8 27.1  MCHC 31.2 31.2 32.0  RDW  15.2 15.1 15.0  PLT 186 191 230    Patient Profile     75 y.o. male with past medical history of chronic pericardial effusion, chronic diastolic congestive heart failure, diabetes mellitus, hypertension, coronary artery disease, renal insufficiency admitted for acute on chronic diastolic congestive heart failure following cardiac catheterization.  Cardiac catheterization on May 9 showed 80% mid LAD lesion, left ventricular end-diastolic pressure 32 mmHg with pulmonary capillary wedge pressure 28 mmHg and pulmonary hypertension with PA pressure 67/19.  Patient admitted for diuresis prior to PCI of LAD.  Echocardiogram repeated this admission and shows newly reduced LV function with ejection fraction 35 to 40%, severe left ventricular hypertrophy, grade 2 diastolic dysfunction, moderate RV dysfunction, moderate pulmonary hypertension, biatrial enlargement, large pericardial effusion, mild mitral regurgitation.  Myocardium with somewhat speckled appearance and by my review is concerning for potential amyloidosis.  Assessment & Plan    1 acute on chronic diastolic congestive heart failure-symptoms have improved.  Renal function worse.  We will hold on  further diuresis for now.  Resume as renal function improves.  2 cardiomyopathy-LV function newly reduced.  Myocardium with question appearance of amyloid.  Continue hydralazine/nitrates.  Will not add beta-blockade in setting of recent Mobitz 1 second-degree AV block.  Renal function worse so we will hold Entresto for now and resume lower dose when renal function improves.  Previously discussed with Dr. Tamala Julian.  Patient's predominant symptoms are CHF and not chest pain.  Echocardiogram shows newly reduced LV function and large pericardial effusion.  He also has conduction abnormalities and above is concerning for amyloid.  We feel best option is pericardial window with myocardial biopsy. Pericardial window and myocardial biopsy apparently scheduled for Monday per patient.  2 coronary artery disease-continue aspirin and statin.  Plan is medical therapy unless patient develops chest pain.  3 Mobitz 1 second-resolved after discontinuing clonidine.  Continue to follow on telemetry.  4 hypertension-blood pressure has trended down and likely is contributing to worsening renal function.  Continue hydralazine/nitrates but hold Entresto.  We will potentially resume lower dose as renal function improves.  Follow blood pressure.  5 acute on chronic chronic stage III kidney disease-creatinine increased compared to baseline.  We will hold Entresto.  6 history of chronic pericardial effusion-pericardial effusion is chronic but has enlarged.  I am also concerned about the potential for amyloid.  Pericardial window with myocardial biopsy planned for Monday.  For questions or updates, please contact [place] Please consult [email or web address] for contact info under        Signed, Kirk Ruths, MD  09/24/2020, 8:59 AM

## 2020-09-25 DIAGNOSIS — I5041 Acute combined systolic (congestive) and diastolic (congestive) heart failure: Secondary | ICD-10-CM | POA: Diagnosis not present

## 2020-09-25 LAB — URINALYSIS, COMPLETE (UACMP) WITH MICROSCOPIC
Bacteria, UA: NONE SEEN
Bilirubin Urine: NEGATIVE
Glucose, UA: >=500 mg/dL — AB
Hgb urine dipstick: NEGATIVE
Ketones, ur: NEGATIVE mg/dL
Nitrite: NEGATIVE
Protein, ur: NEGATIVE mg/dL
Specific Gravity, Urine: 1.022 (ref 1.005–1.030)
pH: 5 (ref 5.0–8.0)

## 2020-09-25 LAB — BASIC METABOLIC PANEL
Anion gap: 7 (ref 5–15)
BUN: 33 mg/dL — ABNORMAL HIGH (ref 8–23)
CO2: 26 mmol/L (ref 22–32)
Calcium: 9.4 mg/dL (ref 8.9–10.3)
Chloride: 106 mmol/L (ref 98–111)
Creatinine, Ser: 2.21 mg/dL — ABNORMAL HIGH (ref 0.61–1.24)
GFR, Estimated: 31 mL/min — ABNORMAL LOW (ref >60)
Glucose, Bld: 127 mg/dL — ABNORMAL HIGH (ref 70–99)
Potassium: 4.2 mmol/L (ref 3.5–5.1)
Sodium: 139 mmol/L (ref 135–145)

## 2020-09-25 LAB — GLUCOSE, CAPILLARY
Glucose-Capillary: 125 mg/dL — ABNORMAL HIGH (ref 70–99)
Glucose-Capillary: 164 mg/dL — ABNORMAL HIGH (ref 70–99)
Glucose-Capillary: 151 mg/dL — ABNORMAL HIGH (ref 70–99)
Glucose-Capillary: 140 mg/dL — ABNORMAL HIGH (ref 70–99)

## 2020-09-25 MED ORDER — HYDRALAZINE HCL 50 MG PO TABS
100.0000 mg | ORAL_TABLET | Freq: Three times a day (TID) | ORAL | Status: DC
  Administered 2020-09-25 – 2020-09-30 (×14): 100 mg via ORAL
  Filled 2020-09-25 (×15): qty 2

## 2020-09-25 MED ORDER — COLCHICINE 0.6 MG PO TABS
0.6000 mg | ORAL_TABLET | Freq: Every day | ORAL | Status: DC
  Administered 2020-09-25 – 2020-09-30 (×6): 0.6 mg via ORAL
  Filled 2020-09-25 (×6): qty 1

## 2020-09-25 NOTE — Progress Notes (Signed)
Progress Note  Patient Name: Corey [name] Date of Encounter: 09/25/2020  [facility] Cardiologist: Candee Furbish, MD   Subjective   Denies CP or dyspnea; complains of right foot pain  Inpatient Medications    Scheduled Meds: . allopurinol  50 mg Oral q AM  . aspirin EC  81 mg Oral q AM  . empagliflozin  10 mg Oral Daily  . gabapentin  300 mg Oral QHS  . heparin  5,000 Units Subcutaneous Q8H  . hydrALAZINE  50 mg Oral TID  . insulin aspart  0-15 Units Subcutaneous TID WC  . isosorbide mononitrate  60 mg Oral Daily  . rosuvastatin  40 mg Oral Daily  . sodium chloride flush  3 mL Intravenous Q12H  . sodium chloride flush  3 mL Intravenous Q12H   Continuous Infusions: . sodium chloride    . sodium chloride    . sodium chloride Stopped (09/22/20 1223)   PRN Meds: sodium chloride, sodium chloride, acetaminophen, albuterol, bismuth subsalicylate, ondansetron (ZOFRAN) IV, senna-docusate, sodium chloride flush, sodium chloride flush   Vital Signs    Vitals:   09/24/20 1943 09/25/20 0341 09/25/20 0347 09/25/20 0559  BP: (!) 144/98   (!) 154/98  Pulse: 75   81  Resp: 20     Temp: 98.3 F (36.8 C)  98.9 F (37.2 C)   TempSrc: Oral  Oral   SpO2: 91%  95% 100%  Weight:  76.1 kg    Height:        Intake/Output Summary (Last 24 hours) at 09/25/2020 1058 Last data filed at 09/25/2020 0939 Gross per 24 hour  Intake 963 ml  Output 1625 ml  Net -662 ml   Last 3 Weights 09/25/2020 09/24/2020 09/23/2020  Weight (lbs) 167 lb 12.3 oz 165 lb 5.5 oz 175 lb 7.8 oz  Weight (kg) 76.1 kg 75 kg 79.6 kg      Telemetry     Sinus with PVCs and rare mobitz 1- Personally Reviewed   Physical Exam   GEN: WD WN NAD Neck:  Supple Cardiac: irregular Respiratory: CTA; no wheeze GI: Soft, NT/ND MS: No edema Neuro:  Grossly intact Psych: Normal affect   Labs     Chemistry Recent Labs  Lab 09/21/20 0336 09/22/20 0214 09/22/20 0800 09/23/20 0343 09/24/20 0347 09/25/20 0257   NA 143 139   < > 139 140 139  K 3.5 3.6   < > 4.5 4.4 4.2  CL 110 108   < > 105 106 106  CO2 24 24   < > 26 28 26   GLUCOSE 106* 118*   < > 147* 135* 127*  BUN 27* 24*   < > 29* 38* 33*  CREATININE 1.55* 1.63*   < > 2.12* 2.48* 2.21*  CALCIUM 9.7 9.4   < > 9.7 9.5 9.4  PROT 5.6* 5.4*  --  6.2*  --   --   ALBUMIN 3.1* 3.0*  --  3.3*  --   --   AST 25 22  --  25  --   --   ALT 17 16  --  16  --   --   ALKPHOS 63 61  --  67  --   --   BILITOT 0.7 1.3*  --  1.3*  --   --   GFRNONAA 47* 44*   < > 32* 27* 31*  ANIONGAP 9 7   < > 8 6 7    < > = values in  this interval not displayed.     Hematology Recent Labs  Lab 09/20/20 1613 09/21/20 0336 09/22/20 0214  WBC 2.7* 3.3* 3.7*  RBC 4.69 4.73 5.28  HGB 12.7* 12.7* 14.3  HCT 40.7 40.7 44.7  MCV 86.8 86.0 84.7  MCH 27.1 26.8 27.1  MCHC 31.2 31.2 32.0  RDW 15.2 15.1 15.0  PLT 186 191 230    Patient Profile     75 y.o. male with past medical history of chronic pericardial effusion, chronic diastolic congestive heart failure, diabetes mellitus, hypertension, coronary artery disease, renal insufficiency admitted for acute on chronic diastolic congestive heart failure following cardiac catheterization.  Cardiac catheterization on May 9 showed 80% mid LAD lesion, left ventricular end-diastolic pressure 32 mmHg with pulmonary capillary wedge pressure 28 mmHg and pulmonary hypertension with PA pressure 67/19.  Patient admitted for diuresis prior to PCI of LAD.  Echocardiogram repeated this admission and shows newly reduced LV function with ejection fraction 35 to 40%, severe left ventricular hypertrophy, grade 2 diastolic dysfunction, moderate RV dysfunction, moderate pulmonary hypertension, biatrial enlargement, large pericardial effusion, mild mitral regurgitation.  Myocardium with somewhat speckled appearance and by my review is concerning for potential amyloidosis.  Assessment & Plan    1 acute on chronic diastolic congestive heart  failure-symptoms have improved.  Renal function improving this morning.  Will likely resume lower dose diuretic later.  2 cardiomyopathy-LV function newly reduced.  Myocardium with question appearance of amyloid.  Continue hydralazine/nitrates.  Will not add beta-blockade in setting of recent Mobitz 1 second-degree AV block. Entresto held due to worsening renal function. Will resume later as renal function improves.  As outlined previously predominant symptoms at time of admission was CHF.  Echocardiogram showed newly reduced LV function, speckled appearance of myocardium (question amyloid) and large pericardial effusion.  Patient is also noted to have conduction abnormalities on ECG again concerning for amyloid.  Plan is for pericardial window and myocardial biopsy on Monday.   2 coronary artery disease-continue aspirin and statin.  Plan is medical therapy unless patient develops chest pain.  3 Mobitz 1 second-remains asymptomatic and has decreased since discontinuing clonidine.  We will continue to follow telemetry.  4 hypertension-blood pressure higher today.  Increase hydralazine to 100 mg 3 times daily.  Resume Entresto when renal function improves.  5 acute on chronic chronic stage III kidney disease-creatinine improving today.  Will not diurese further at this point.  6 history of chronic pericardial effusion-pericardial effusion is chronic but has enlarged.  I am also concerned about the potential for amyloid.  Pericardial window with myocardial biopsy planned for Monday.  For questions or updates, please contact [place] Please consult [email or web address] for contact info under        Signed, Kirk Ruths, MD  09/25/2020, 10:58 AM

## 2020-09-25 NOTE — Progress Notes (Signed)
Paged with fever 101.4. Pt feels well except for possible gout flare in right foot. Will send a urine. Have also ordered colchicine. RN to give tylenol for fever.

## 2020-09-26 DIAGNOSIS — I5041 Acute combined systolic (congestive) and diastolic (congestive) heart failure: Secondary | ICD-10-CM | POA: Diagnosis not present

## 2020-09-26 LAB — GLUCOSE, CAPILLARY
Glucose-Capillary: 113 mg/dL — ABNORMAL HIGH (ref 70–99)
Glucose-Capillary: 198 mg/dL — ABNORMAL HIGH (ref 70–99)
Glucose-Capillary: 145 mg/dL — ABNORMAL HIGH (ref 70–99)
Glucose-Capillary: 282 mg/dL — ABNORMAL HIGH (ref 70–99)

## 2020-09-26 LAB — TYPE AND SCREEN
ABO/RH(D): A POS
Antibody Screen: NEGATIVE

## 2020-09-26 LAB — PROTIME-INR
INR: 1.1 (ref 0.8–1.2)
Prothrombin Time: 13.8 seconds (ref 11.4–15.2)

## 2020-09-26 LAB — SURGICAL PCR SCREEN
MRSA, PCR: NEGATIVE
Staphylococcus aureus: NEGATIVE

## 2020-09-26 LAB — APTT: aPTT: 29 seconds (ref 24–36)

## 2020-09-26 MED ORDER — CEFAZOLIN SODIUM-DEXTROSE 2-4 GM/100ML-% IV SOLN
2.0000 g | INTRAVENOUS | Status: DC
  Filled 2020-09-26 (×2): qty 100

## 2020-09-26 NOTE — Plan of Care (Signed)
  Problem: Education: Goal: Knowledge of General Education information will improve Description Including pain rating scale, medication(s)/side effects and non-pharmacologic comfort measures Outcome: Progressing   Problem: Health Behavior/Discharge Planning: Goal: Ability to manage health-related needs will improve Outcome: Progressing   

## 2020-09-26 NOTE — Plan of Care (Signed)

## 2020-09-26 NOTE — Progress Notes (Signed)
Progress Note  Patient Name: Corey [name] Date of Encounter: 09/26/2020  [facility] Cardiologist: Candee Furbish, MD   Subjective   No CP or dyspnea  Inpatient Medications    Scheduled Meds: . allopurinol  50 mg Oral q AM  . aspirin EC  81 mg Oral q AM  . colchicine  0.6 mg Oral Daily  . empagliflozin  10 mg Oral Daily  . gabapentin  300 mg Oral QHS  . heparin  5,000 Units Subcutaneous Q8H  . hydrALAZINE  100 mg Oral TID  . insulin aspart  0-15 Units Subcutaneous TID WC  . isosorbide mononitrate  60 mg Oral Daily  . rosuvastatin  40 mg Oral Daily  . sodium chloride flush  3 mL Intravenous Q12H  . sodium chloride flush  3 mL Intravenous Q12H   Continuous Infusions: . sodium chloride    . sodium chloride    . sodium chloride Stopped (09/22/20 1223)   PRN Meds: sodium chloride, sodium chloride, acetaminophen, albuterol, bismuth subsalicylate, ondansetron (ZOFRAN) IV, senna-docusate, sodium chloride flush, sodium chloride flush   Vital Signs    Vitals:   09/26/20 0412 09/26/20 0413 09/26/20 0417 09/26/20 0832  BP:   (!) 176/82 (!) 148/97  Pulse: 76 70 75   Resp: (!) 22 20 17    Temp:   98.1 F (36.7 C)   TempSrc:   Oral   SpO2: 99% 96% 97%   Weight:   76.1 kg   Height:        Intake/Output Summary (Last 24 hours) at 09/26/2020 1044 Last data filed at 09/26/2020 5009 Gross per 24 hour  Intake 483 ml  Output 1050 ml  Net -567 ml   Last 3 Weights 09/26/2020 09/25/2020 09/24/2020  Weight (lbs) 167 lb 12.3 oz 167 lb 12.3 oz 165 lb 5.5 oz  Weight (kg) 76.1 kg 76.1 kg 75 kg      Telemetry     Sinus with PVCs and rare mobitz 1- Personally Reviewed   Physical Exam   GEN: NAD Neck:  Supple, JVP 10 cm Cardiac: irregular, no rub Respiratory: CTA; no rhonchi GI: Soft, NT/ND, no masses MS: No edema Neuro:  No focal findings Psych: Normal affect   Labs     Chemistry Recent Labs  Lab 09/21/20 0336 09/22/20 0214 09/22/20 0800 09/23/20 0343  09/24/20 0347 09/25/20 0257  NA 143 139   < > 139 140 139  K 3.5 3.6   < > 4.5 4.4 4.2  CL 110 108   < > 105 106 106  CO2 24 24   < > 26 28 26   GLUCOSE 106* 118*   < > 147* 135* 127*  BUN 27* 24*   < > 29* 38* 33*  CREATININE 1.55* 1.63*   < > 2.12* 2.48* 2.21*  CALCIUM 9.7 9.4   < > 9.7 9.5 9.4  PROT 5.6* 5.4*  --  6.2*  --   --   ALBUMIN 3.1* 3.0*  --  3.3*  --   --   AST 25 22  --  25  --   --   ALT 17 16  --  16  --   --   ALKPHOS 63 61  --  67  --   --   BILITOT 0.7 1.3*  --  1.3*  --   --   GFRNONAA 47* 44*   < > 32* 27* 31*  ANIONGAP 9 7   < > 8 6 7    < > =  values in this interval not displayed.     Hematology Recent Labs  Lab 09/20/20 1613 09/21/20 0336 09/22/20 0214  WBC 2.7* 3.3* 3.7*  RBC 4.69 4.73 5.28  HGB 12.7* 12.7* 14.3  HCT 40.7 40.7 44.7  MCV 86.8 86.0 84.7  MCH 27.1 26.8 27.1  MCHC 31.2 31.2 32.0  RDW 15.2 15.1 15.0  PLT 186 191 230    Patient Profile     75 y.o. male with past medical history of chronic pericardial effusion, chronic diastolic congestive heart failure, diabetes mellitus, hypertension, coronary artery disease, renal insufficiency admitted for acute on chronic diastolic congestive heart failure following cardiac catheterization.  Cardiac catheterization on May 9 showed 80% mid LAD lesion, left ventricular end-diastolic pressure 32 mmHg with pulmonary capillary wedge pressure 28 mmHg and pulmonary hypertension with PA pressure 67/19.  Patient admitted for diuresis prior to PCI of LAD.  Echocardiogram repeated this admission and shows newly reduced LV function with ejection fraction 35 to 40%, severe left ventricular hypertrophy, grade 2 diastolic dysfunction, moderate RV dysfunction, moderate pulmonary hypertension, biatrial enlargement, large pericardial effusion, mild mitral regurgitation.  Myocardium with somewhat speckled appearance and by my review is concerning for potential amyloidosis.  Assessment & Plan    1 acute on chronic  diastolic congestive heart failure-symptoms have improved.  Recheck renal function tomorrow and likely resume lower dose diuretic at that time.  2 cardiomyopathy-LV function newly reduced.  Myocardium with question appearance of amyloid.  Continue hydralazine/nitrates.  Will not add beta-blockade in setting of recent Mobitz 1 second-degree AV block. Entresto held due to worsening renal function.  If renal function back to baseline tomorrow can resume 24/26 twice daily. As outlined previously predominant symptoms at time of admission was CHF.  Echocardiogram showed newly reduced LV function, speckled appearance of myocardium (question amyloid) and large pericardial effusion.  Patient is also noted to have conduction abnormalities on ECG again concerning for amyloid.  Plan is for pericardial window and myocardial biopsy tomorrow.   2 coronary artery disease-continue aspirin and statin.  Plan is medical therapy unless patient develops chest pain.  3 Mobitz 1 second-remains asymptomatic and frequency has decreased since discontinuing clonidine.  We will continue to follow telemetry.  4 hypertension-blood pressure increasing.  Continue hydralazine.  Add Entresto tomorrow if renal function continues to improve.  5 acute on chronic chronic stage III kidney disease-recheck renal function tomorrow.  6 history of chronic pericardial effusion-pericardial effusion is chronic but has enlarged.  I am also concerned about the potential for amyloid.  Pericardial window with myocardial biopsy planned for Monday.  7 low-grade fever-noted but no localizing symptoms of infection.  Urinalysis unremarkable.  Patient was started on colchicine for gout and symptoms have improved.  Continue to follow.  For questions or updates, please contact [place] Please consult [email or web address] for contact info under        Signed, Kirk Ruths, MD  09/26/2020, 10:44 AM

## 2020-09-26 NOTE — Progress Notes (Signed)
Patient has a new rhythm change- junctional rhythm. MD notified.

## 2020-09-27 ENCOUNTER — Encounter (HOSPITAL_COMMUNITY): Payer: Self-pay | Admitting: Cardiology

## 2020-09-27 ENCOUNTER — Inpatient Hospital Stay (HOSPITAL_COMMUNITY): Payer: Medicare HMO

## 2020-09-27 ENCOUNTER — Encounter (HOSPITAL_COMMUNITY)
Admission: RE | Disposition: A | Discharge status: Home / Self Care | Payer: Self-pay | Source: Home / Self Care | Attending: Cardiology

## 2020-09-27 DIAGNOSIS — I313 Pericardial effusion (noninflammatory): Secondary | ICD-10-CM

## 2020-09-27 DIAGNOSIS — Z9889 Other specified postprocedural states: Secondary | ICD-10-CM | POA: Diagnosis present

## 2020-09-27 DIAGNOSIS — I200 Unstable angina: Secondary | ICD-10-CM

## 2020-09-27 HISTORY — PX: TEE WITHOUT CARDIOVERSION: SHX5443

## 2020-09-27 LAB — GLUCOSE, CAPILLARY
Glucose-Capillary: 119 mg/dL — ABNORMAL HIGH (ref 70–99)
Glucose-Capillary: 117 mg/dL — ABNORMAL HIGH (ref 70–99)
Glucose-Capillary: 135 mg/dL — ABNORMAL HIGH (ref 70–99)
Glucose-Capillary: 171 mg/dL — ABNORMAL HIGH (ref 70–99)
Glucose-Capillary: 215 mg/dL — ABNORMAL HIGH (ref 70–99)

## 2020-09-27 LAB — CBC
HCT: 45.2 % (ref 39.0–52.0)
Hemoglobin: 14.5 g/dL (ref 13.0–17.0)
MCH: 27.3 pg (ref 26.0–34.0)
MCHC: 32.1 g/dL (ref 30.0–36.0)
MCV: 85.1 fL (ref 80.0–100.0)
Platelets: 196 10*3/uL (ref 150–400)
RBC: 5.31 MIL/uL (ref 4.22–5.81)
RDW: 14.6 % (ref 11.5–15.5)
WBC: 5.1 10*3/uL (ref 4.0–10.5)
nRBC: 0 % (ref 0.0–0.2)

## 2020-09-27 LAB — BASIC METABOLIC PANEL
Anion gap: 7 (ref 5–15)
BUN: 29 mg/dL — ABNORMAL HIGH (ref 8–23)
CO2: 25 mmol/L (ref 22–32)
Calcium: 9.7 mg/dL (ref 8.9–10.3)
Chloride: 107 mmol/L (ref 98–111)
Creatinine, Ser: 2.08 mg/dL — ABNORMAL HIGH (ref 0.61–1.24)
GFR, Estimated: 33 mL/min — ABNORMAL LOW (ref >60)
Glucose, Bld: 123 mg/dL — ABNORMAL HIGH (ref 70–99)
Potassium: 4.4 mmol/L (ref 3.5–5.1)
Sodium: 139 mmol/L (ref 135–145)

## 2020-09-27 LAB — TSH: TSH: 1.52 u[IU]/mL (ref 0.350–4.500)

## 2020-09-27 LAB — URINE CULTURE

## 2020-09-27 LAB — T4, FREE: Free T4: 1.02 ng/dL (ref 0.61–1.12)

## 2020-09-27 SURGERY — CREATION, PERICARDIAL WINDOW, ANTERIOR THORACOTOMY APPROACH
Anesthesia: General

## 2020-09-27 MED ORDER — 0.9 % SODIUM CHLORIDE (POUR BTL) OPTIME
TOPICAL | Status: DC | PRN
  Administered 2020-09-27: 2000 mL

## 2020-09-27 MED ORDER — ETOMIDATE 2 MG/ML IV SOLN
INTRAVENOUS | Status: DC | PRN
  Administered 2020-09-27: 16 mg via INTRAVENOUS

## 2020-09-27 MED ORDER — ONDANSETRON HCL 4 MG/2ML IJ SOLN: 4.0000 mg | Freq: Four times a day (QID) | INTRAMUSCULAR | Status: DC | PRN

## 2020-09-27 MED ORDER — ROCURONIUM BROMIDE 10 MG/ML (PF) SYRINGE
PREFILLED_SYRINGE | INTRAVENOUS | Status: AC
  Filled 2020-09-27: qty 10

## 2020-09-27 MED ORDER — BISACODYL 5 MG PO TBEC
10.0000 mg | DELAYED_RELEASE_TABLET | Freq: Every day | ORAL | Status: DC
  Administered 2020-09-27 – 2020-09-30 (×4): 10 mg via ORAL
  Filled 2020-09-27 (×4): qty 2

## 2020-09-27 MED ORDER — SODIUM CHLORIDE 0.9 % IV SOLN: INTRAVENOUS | Status: DC

## 2020-09-27 MED ORDER — FENTANYL CITRATE (PF) 250 MCG/5ML IJ SOLN
INTRAMUSCULAR | Status: DC | PRN
  Administered 2020-09-27 (×3): 50 ug via INTRAVENOUS
  Administered 2020-09-27: 150 ug via INTRAVENOUS

## 2020-09-27 MED ORDER — TRAMADOL HCL 50 MG PO TABS: 50.0000 mg | ORAL_TABLET | Freq: Four times a day (QID) | ORAL | Status: DC | PRN

## 2020-09-27 MED ORDER — EPHEDRINE 5 MG/ML INJ
INTRAVENOUS | Status: AC
  Filled 2020-09-27: qty 20

## 2020-09-27 MED ORDER — ONDANSETRON HCL 4 MG/2ML IJ SOLN
INTRAMUSCULAR | Status: AC
  Filled 2020-09-27: qty 2

## 2020-09-27 MED ORDER — ACETAMINOPHEN 160 MG/5ML PO SOLN: 1000.0000 mg | Freq: Four times a day (QID) | ORAL | Status: DC

## 2020-09-27 MED ORDER — CALCIUM GLUCONATE 10 % IV SOLN
INTRAVENOUS | Status: DC | PRN
  Administered 2020-09-27: 100 mg via INTRAVENOUS

## 2020-09-27 MED ORDER — BUPIVACAINE LIPOSOME 1.3 % IJ SUSP
INTRAMUSCULAR | Status: AC
  Filled 2020-09-27: qty 20

## 2020-09-27 MED ORDER — BUPIVACAINE LIPOSOME 1.3 % IJ SUSP
INTRAMUSCULAR | Status: DC | PRN
  Administered 2020-09-27: 20 mL

## 2020-09-27 MED ORDER — ORAL CARE MOUTH RINSE: 15.0000 mL | Freq: Once | OROMUCOSAL | Status: AC

## 2020-09-27 MED ORDER — PROPOFOL 10 MG/ML IV BOLUS
INTRAVENOUS | Status: DC | PRN
  Administered 2020-09-27: 70 mg via INTRAVENOUS
  Administered 2020-09-27: 40 mg via INTRAVENOUS

## 2020-09-27 MED ORDER — CHLORHEXIDINE GLUCONATE 0.12 % MT SOLN: 15.0000 mL | Freq: Once | OROMUCOSAL | Status: AC

## 2020-09-27 MED ORDER — CEFAZOLIN SODIUM-DEXTROSE 2-3 GM-%(50ML) IV SOLR
INTRAVENOUS | Status: DC | PRN
  Administered 2020-09-27: 2 g via INTRAVENOUS

## 2020-09-27 MED ORDER — CEFAZOLIN SODIUM-DEXTROSE 2-4 GM/100ML-% IV SOLN
2.0000 g | Freq: Two times a day (BID) | INTRAVENOUS | Status: AC
  Administered 2020-09-27 – 2020-09-28 (×2): 2 g via INTRAVENOUS
  Filled 2020-09-27 (×3): qty 100

## 2020-09-27 MED ORDER — OXYCODONE HCL 5 MG PO TABS
5.0000 mg | ORAL_TABLET | ORAL | Status: DC | PRN
  Administered 2020-09-27 – 2020-09-28 (×2): 10 mg via ORAL
  Filled 2020-09-27 (×2): qty 2

## 2020-09-27 MED ORDER — DEXAMETHASONE SODIUM PHOSPHATE 10 MG/ML IJ SOLN
INTRAMUSCULAR | Status: AC
  Filled 2020-09-27: qty 1

## 2020-09-27 MED ORDER — ONDANSETRON HCL 4 MG/2ML IJ SOLN
INTRAMUSCULAR | Status: DC | PRN
  Administered 2020-09-27: 4 mg via INTRAVENOUS

## 2020-09-27 MED ORDER — PHENYLEPHRINE 40 MCG/ML (10ML) SYRINGE FOR IV PUSH (FOR BLOOD PRESSURE SUPPORT)
PREFILLED_SYRINGE | INTRAVENOUS | Status: AC
  Filled 2020-09-27: qty 30

## 2020-09-27 MED ORDER — HYDRALAZINE HCL 20 MG/ML IJ SOLN
INTRAMUSCULAR | Status: DC | PRN
  Administered 2020-09-27: 4 mg via INTRAVENOUS

## 2020-09-27 MED ORDER — PHENYLEPHRINE HCL (PRESSORS) 10 MG/ML IV SOLN
INTRAVENOUS | Status: DC | PRN
  Administered 2020-09-27 (×2): 80 ug via INTRAVENOUS

## 2020-09-27 MED ORDER — FENTANYL CITRATE (PF) 100 MCG/2ML IJ SOLN
25.0000 ug | INTRAMUSCULAR | Status: DC | PRN
  Administered 2020-09-27: 50 ug via INTRAVENOUS

## 2020-09-27 MED ORDER — FENTANYL CITRATE (PF) 100 MCG/2ML IJ SOLN
INTRAMUSCULAR | Status: AC
  Filled 2020-09-27: qty 2

## 2020-09-27 MED ORDER — LACTATED RINGERS IV SOLN: INTRAVENOUS | Status: DC | PRN

## 2020-09-27 MED ORDER — HEMOSTATIC AGENTS (NO CHARGE) OPTIME
TOPICAL | Status: DC | PRN
  Administered 2020-09-27: 1 via TOPICAL

## 2020-09-27 MED ORDER — SUCCINYLCHOLINE CHLORIDE 200 MG/10ML IV SOSY
PREFILLED_SYRINGE | INTRAVENOUS | Status: AC
  Filled 2020-09-27: qty 20

## 2020-09-27 MED ORDER — MORPHINE SULFATE (PF) 2 MG/ML IV SOLN
2.0000 mg | INTRAVENOUS | Status: DC | PRN
Start: 2020-09-27 — End: 2020-09-30

## 2020-09-27 MED ORDER — DEXAMETHASONE SODIUM PHOSPHATE 10 MG/ML IJ SOLN
INTRAMUSCULAR | Status: DC | PRN
  Administered 2020-09-27: 5 mg via INTRAVENOUS

## 2020-09-27 MED ORDER — SUGAMMADEX SODIUM 200 MG/2ML IV SOLN
INTRAVENOUS | Status: DC | PRN
  Administered 2020-09-27: 200 mg via INTRAVENOUS

## 2020-09-27 MED ORDER — LIDOCAINE 2% (20 MG/ML) 5 ML SYRINGE
INTRAMUSCULAR | Status: DC | PRN
  Administered 2020-09-27: 80 mg via INTRAVENOUS

## 2020-09-27 MED ORDER — CHLORHEXIDINE GLUCONATE 0.12 % MT SOLN
OROMUCOSAL | Status: AC
  Administered 2020-09-27: 15 mL via OROMUCOSAL
  Filled 2020-09-27: qty 15

## 2020-09-27 MED ORDER — PHENYLEPHRINE HCL-NACL 10-0.9 MG/250ML-% IV SOLN
INTRAVENOUS | Status: DC | PRN
  Administered 2020-09-27: 40 ug/min via INTRAVENOUS

## 2020-09-27 MED ORDER — ISOSORBIDE MONONITRATE ER 60 MG PO TB24
120.0000 mg | ORAL_TABLET | Freq: Every day | ORAL | Status: DC
  Administered 2020-09-27 – 2020-09-30 (×4): 120 mg via ORAL
  Filled 2020-09-27 (×4): qty 2

## 2020-09-27 MED ORDER — ALBUMIN HUMAN 5 % IV SOLN: INTRAVENOUS | Status: DC | PRN

## 2020-09-27 MED ORDER — MIDAZOLAM HCL 2 MG/2ML IJ SOLN
INTRAMUSCULAR | Status: AC
  Filled 2020-09-27: qty 2

## 2020-09-27 MED ORDER — PROPOFOL 10 MG/ML IV BOLUS
INTRAVENOUS | Status: AC
  Filled 2020-09-27: qty 20

## 2020-09-27 MED ORDER — ACETAMINOPHEN 500 MG PO TABS
1000.0000 mg | ORAL_TABLET | Freq: Four times a day (QID) | ORAL | Status: DC
  Administered 2020-09-27 – 2020-09-30 (×9): 1000 mg via ORAL
  Filled 2020-09-27 (×9): qty 2

## 2020-09-27 MED ORDER — FENTANYL CITRATE (PF) 250 MCG/5ML IJ SOLN
INTRAMUSCULAR | Status: AC
  Filled 2020-09-27: qty 5

## 2020-09-27 MED ORDER — SENNOSIDES-DOCUSATE SODIUM 8.6-50 MG PO TABS
1.0000 | ORAL_TABLET | Freq: Every day | ORAL | Status: DC
  Administered 2020-09-27 – 2020-09-29 (×3): 1 via ORAL
  Filled 2020-09-27 (×3): qty 1

## 2020-09-27 MED ORDER — ROCURONIUM BROMIDE 10 MG/ML (PF) SYRINGE
PREFILLED_SYRINGE | INTRAVENOUS | Status: DC | PRN
  Administered 2020-09-27: 60 mg via INTRAVENOUS

## 2020-09-27 SURGICAL SUPPLY — 59 items
ADH SKN CLS APL DERMABOND .7 (GAUZE/BANDAGES/DRESSINGS) ×2
APL SKNCLS STERI-STRIP NONHPOA (GAUZE/BANDAGES/DRESSINGS) ×2
BENZOIN TINCTURE PRP APPL 2/3 (GAUZE/BANDAGES/DRESSINGS) ×3 IMPLANT
BLADE CLIPPER SURG (BLADE) ×3 IMPLANT
BLANKET WARM CARDIAC ADLT BAIR (MISCELLANEOUS) ×1 IMPLANT
CANISTER SUCT 3000ML PPV (MISCELLANEOUS) ×3 IMPLANT
CNTNR URN SCR LID CUP LEK RST (MISCELLANEOUS) IMPLANT
CONN ST 1/4X3/8  BEN (MISCELLANEOUS) ×6
CONN ST 1/4X3/8 BEN (MISCELLANEOUS) ×2 IMPLANT
CONT SPEC 4OZ STRL OR WHT (MISCELLANEOUS) ×6
COVER SURGICAL LIGHT HANDLE (MISCELLANEOUS) ×6 IMPLANT
DERMABOND ADVANCED (GAUZE/BANDAGES/DRESSINGS) ×1
DERMABOND ADVANCED .7 DNX12 (GAUZE/BANDAGES/DRESSINGS) ×2 IMPLANT
DRAIN CHANNEL 28F RND 3/8 FF (WOUND CARE) ×3 IMPLANT
DRAPE CHEST BREAST 15X10 FENES (DRAPES) ×4 IMPLANT
DRAPE HALF SHEET 40X57 (DRAPES) ×3 IMPLANT
DRAPE LAPAROSCOPIC ABDOMINAL (DRAPES) ×3 IMPLANT
DRSG AQUACEL AG ADV 3.5X 6 (GAUZE/BANDAGES/DRESSINGS) ×1 IMPLANT
ELECT BLADE 4.0 EZ CLEAN MEGAD (MISCELLANEOUS) ×6
ELECT REM PT RETURN 9FT ADLT (ELECTROSURGICAL) ×3
ELECTRODE BLDE 4.0 EZ CLN MEGD (MISCELLANEOUS) ×2 IMPLANT
ELECTRODE REM PT RTRN 9FT ADLT (ELECTROSURGICAL) ×2 IMPLANT
GAUZE SPONGE 4X4 12PLY STRL (GAUZE/BANDAGES/DRESSINGS) IMPLANT
GAUZE SPONGE 4X4 12PLY STRL LF (GAUZE/BANDAGES/DRESSINGS) ×2 IMPLANT
GLOVE NEODERM STRL 7.5  LF PF (GLOVE) ×4
GLOVE NEODERM STRL 7.5 LF PF (GLOVE) ×4 IMPLANT
GLOVE SURG NEODERM 7.5  LF PF (GLOVE) ×2
GLOVE SURG UNDER POLY LF SZ6 (GLOVE) ×1 IMPLANT
KIT BASIN OR (CUSTOM PROCEDURE TRAY) ×3 IMPLANT
KIT TURNOVER KIT B (KITS) ×3 IMPLANT
NDL BIOPSY 14X6 SOFT TISS (NEEDLE) IMPLANT
NEEDLE 22X1 1/2 (OR ONLY) (NEEDLE) ×3 IMPLANT
NEEDLE BIOPSY 14X6 SOFT TISS (NEEDLE) ×3 IMPLANT
NS IRRIG 1000ML POUR BTL (IV SOLUTION) ×3 IMPLANT
PACK CHEST (CUSTOM PROCEDURE TRAY) ×3 IMPLANT
PAD ARMBOARD 7.5X6 YLW CONV (MISCELLANEOUS) ×6 IMPLANT
PAD ELECT DEFIB RADIOL ZOLL (MISCELLANEOUS) ×3 IMPLANT
STRIP CLOSURE SKIN 1/2X4 (GAUZE/BANDAGES/DRESSINGS) ×3 IMPLANT
SUT MNCRL AB 4-0 PS2 18 (SUTURE) ×4 IMPLANT
SUT PDS AB 1 CTX 36 (SUTURE) ×4 IMPLANT
SUT PDS AB 2-0 CT1 27 (SUTURE) IMPLANT
SUT SILK  1 MH (SUTURE) ×6
SUT SILK 1 MH (SUTURE) ×2 IMPLANT
SUT SILK 2 0 SH CR/8 (SUTURE) ×3 IMPLANT
SUT SILK 2 0SH CR/8 30 (SUTURE) ×1 IMPLANT
SUT VIC AB 0 CT1 27 (SUTURE) ×3
SUT VIC AB 0 CT1 27XBRD ANBCTR (SUTURE) ×2 IMPLANT
SUT VIC AB 2-0 CT1 36 (SUTURE) ×1 IMPLANT
SUT VIC AB CT1 27XBRD ANBCTRL (SUTURE) ×2
SWAB COLLECTION DEVICE MRSA (MISCELLANEOUS) IMPLANT
SWAB CULTURE ESWAB REG 1ML (MISCELLANEOUS) IMPLANT
SYR 10ML LL (SYRINGE) ×3 IMPLANT
SYSTEM SAHARA CHEST DRAIN ATS (WOUND CARE) IMPLANT
TAPE CLOTH SURG 4X10 WHT LF (GAUZE/BANDAGES/DRESSINGS) ×1 IMPLANT
TOWEL GREEN STERILE (TOWEL DISPOSABLE) ×3 IMPLANT
TOWEL GREEN STERILE FF (TOWEL DISPOSABLE) ×3 IMPLANT
TRAP SPECIMEN MUCUS 40CC (MISCELLANEOUS) ×1 IMPLANT
TRAY FOLEY SLVR 16FR TEMP STAT (SET/KITS/TRAYS/PACK) ×3 IMPLANT
WATER STERILE IRR 1000ML POUR (IV SOLUTION) ×6 IMPLANT

## 2020-09-27 NOTE — Progress Notes (Signed)
Patient tempt. 99.6 oral report given to OR.

## 2020-09-27 NOTE — Brief Op Note (Signed)
09/27/2020  2:17 PM  PATIENT:  Corey [name]  75 y.o. male  PRE-OPERATIVE DIAGNOSIS:  PERICARDIAL EFFUSION  POST-OPERATIVE DIAGNOSIS:   PERICARDIAL EFFUSION  PROCEDURE:  Procedure(s): PERICARDIAL WINDOW WITH THORACOTOMY APPROACH WITH MYOCARDIAL BIOPSY (Left) TRANSESOPHAGEAL ECHOCARDIOGRAM (TEE) (N/A)  SURGEON:  Surgeon(s) and Role:    * Wonda Olds, MD - Primary  PHYSICIAN ASSISTANT: Rowynn Mcweeney PA-C  ASSISTANTS: none   ANESTHESIA:   general  EBL:  5 mL   BLOOD ADMINISTERED:none  DRAINS: 28 f BLAKE TUBE IN PERICARDIAL SPACE   LOCAL MEDICATIONS USED: EXPAREL  SPECIMEN:  Source of Specimen:  PERICARDIAL FLUID AND TISSUE BX, MYOCARDIAL BX  DISPOSITION OF SPECIMEN:  PATH/CYTOLOGY  COUNTS:  YES  TOURNIQUET:  * No tourniquets in log *  DICTATION: .Dragon Dictation  PLAN OF CARE: Admit to inpatient   PATIENT DISPOSITION:  PACU - hemodynamically stable.   Delay start of Pharmacological VTE agent (>24hrs) due to surgical blood loss or risk of bleeding: yes  COMPLICATIONS: NO KNOWN

## 2020-09-27 NOTE — Anesthesia Procedure Notes (Signed)
Procedure Name: Intubation Date/Time: 09/27/2020 1:21 PM Performed by: Glynda Jaeger, CRNA Pre-anesthesia Checklist: Patient identified, Patient being monitored, Timeout performed, Emergency Drugs available and Suction available Patient Re-evaluated:Patient Re-evaluated prior to induction Oxygen Delivery Method: Circle System Utilized Preoxygenation: Pre-oxygenation with 100% oxygen Induction Type: IV induction Ventilation: Mask ventilation without difficulty Laryngoscope Size: Mac and 4 Grade View: Grade II Tube type: Oral Tube size: 7.5 mm Number of attempts: 1 Airway Equipment and Method: Stylet Placement Confirmation: ETT inserted through vocal cords under direct vision,  positive ETCO2 and breath sounds checked- equal and bilateral Secured at: 22 cm Tube secured with: Tape Dental Injury: Teeth and Oropharynx as per pre-operative assessment

## 2020-09-27 NOTE — H&P (Signed)
History and Physical Interval Note:  09/27/2020 12:39 PM  Corey [name]  has presented today for surgery, with the diagnosis of PERICARDIAL EFFUSION.  The various methods of treatment have been discussed with the patient and family. After consideration of risks, benefits and other options for treatment, the patient has consented to  Procedure(s): PERICARDIAL WINDOW WITH THORACOTOMY APPROACH (Left) TRANSESOPHAGEAL ECHOCARDIOGRAM (TEE) (N/A) as a surgical intervention.  The patient's history has been reviewed, patient examined, no change in status, stable for surgery.  I have reviewed the patient's chart and labs.  Questions were answered to the patient's satisfaction.     Wonda Olds  The procedure with also include myocardial biopsy to assess for amyloid heart disease  [name] Z. Orvan Seen, [place]

## 2020-09-27 NOTE — TOC Progression Note (Addendum)
Transition of Care (TOC) - Progression Note  Heart Failure   Patient Details  Name: Corey [name] MRN: [id number] Date of Birth: December 17, 1945  Transition of Care [facility]) CM/SW [place], [place] Phone Number: 09/27/2020, 4:37 PM  Clinical Narrative:    CSW received a call from the patients wife asking about how to obtain a hearing aid for [name]. CSW called the patients wife back who reported that she was on the second floor of the hospital waiting due to her husband having a procedure and that he would go to a different unit of the hospital after having the procedure. CSW informed [name] that CSW will bring her and the patient resources regarding a hearing aid. CSW stopped by the patients room and dropped off resources for the patient and his wife. [name] presented sleeping and [name] was not in the room at the time. CSW spoke with [name] over the phone and made her aware of the resources that CSW left in the patients room and to reach out if other needs arise.      Barriers to Discharge: Continued Medical Work up  Expected Discharge Plan and Services   In-house Referral: Clinical Social Work                                             Social Determinants of Health (SDOH) Interventions Food Insecurity Interventions: Intervention Not Indicated Financial Strain Interventions: Intervention Not Indicated Housing Interventions: Intervention Not Indicated Transportation Interventions: Intervention Not Indicated  Readmission Risk Interventions No flowsheet data found.  Laquan Ludden, MSW, [place] Heart Failure Social Worker

## 2020-09-27 NOTE — Anesthesia Preprocedure Evaluation (Signed)
Anesthesia Evaluation  Patient identified by MRN, date of birth, ID band Patient awake    Reviewed: Allergy & Precautions, NPO status , Patient's Chart, lab work & pertinent test results  Airway Mallampati: II  TM Distance: >3 FB     Dental   Pulmonary shortness of breath, sleep apnea , pneumonia, former smoker,    breath sounds clear to auscultation       Cardiovascular hypertension, + angina + CAD, + Past MI, +CHF and + DOE   Rhythm:Regular Rate:Normal     Neuro/Psych Seizures -,     GI/Hepatic negative GI ROS, Neg liver ROS,   Endo/Other  diabetes  Renal/GU Renal disease     Musculoskeletal   Abdominal   Peds  Hematology   Anesthesia Other Findings   Reproductive/Obstetrics                             Anesthesia Physical Anesthesia Plan  ASA: III  Anesthesia Plan: General   Post-op Pain Management:    Induction: Intravenous  PONV Risk Score and Plan: 2 and Ondansetron, Dexamethasone and Midazolam  Airway Management Planned:   Additional Equipment: Arterial line, CVP and Ultrasound Guidance Line Placement  Intra-op Plan:   Post-operative Plan: Possible Post-op intubation/ventilation  Informed Consent: I have reviewed the patients History and Physical, chart, labs and discussed the procedure including the risks, benefits and alternatives for the proposed anesthesia with the patient or authorized representative who has indicated his/her understanding and acceptance.     Dental advisory given  Plan Discussed with: Anesthesiologist and CRNA  Anesthesia Plan Comments:         Anesthesia Quick Evaluation

## 2020-09-27 NOTE — Anesthesia Procedure Notes (Signed)
Arterial Line Insertion Start/End5/16/2022 12:35 PM, 09/27/2020 12:40 PM Performed by: Colin Benton, CRNA, CRNA  Preanesthetic checklist: patient identified, IV checked, site marked, risks and benefits discussed, surgical consent, monitors and equipment checked, pre-op evaluation, timeout performed and anesthesia consent Lidocaine 1% used for infiltration Right, radial was placed Catheter size: 20 G Hand hygiene performed , maximum sterile barriers used  and Seldinger technique used Allen's test indicative of satisfactory collateral circulation Attempts: 1 Procedure performed without using ultrasound guided technique. Following insertion, dressing applied and Biopatch. Post procedure assessment: normal and unchanged  Patient tolerated the procedure well with no immediate complications.

## 2020-09-27 NOTE — Progress Notes (Signed)
Heart Failure Stewardship Pharmacist Progress Note   PCP: [name], MD PCP-Cardiologist: Candee Furbish, MD    HPI:  75 yo male with PMH for chronic pericardial effusion, CHF (EF 60-65% in May 2021), DM, CVA in 2014, HTN, MI, CKD3, and OSA. Presented for scheduled cardiac cath on 09/20/20 which revealed severe single-vessel disease of LAD and marginally reduced CI of 2.09. ECHO on 09/21/20 revealed worsened LVEF 35-40%, RV function moderately reduced. Pericardial effusion enlarged, appearance of myocardium concerning for potential amyloidosis. Pericardial window scheduled for today. Plan for medical management of CAD rather than PCI unless he becomes symptomatic again. Anticipated discharge tomorrow.   Current HF Medications: Jardiance 10 mg daily  Hydralazine 100 mg TID Isosorbide mononitrate 120 mg daily  Prior to admission HF Medications: PO furosemide 20 mg daily  Losartan 100 mg daily Hydralazine 75 mg TID Isosorbide mononitrate 60 mg BID   Pertinent Lab Values: . Serum creatinine 2.08, BUN 29, Potassium 4.4, Sodium 139  Vital Signs: . Weight: 168 lbs (admission weight: 185 lbs) . Blood pressure: 150-170/90s . Heart rate: 70-80s  Medication Assistance / Insurance Benefits Check: Does the patient have prescription insurance?  Yes Type of insurance plan: [employer] Medicare  Does the patient qualify for medication assistance through manufacturers or grants?   Pending . Eligible grants and/or patient assistance programs: Pending . Medication assistance applications in progress: None  . Medication assistance applications approved: None Approved medication assistance renewals will be completed by: Pending  Outpatient Pharmacy:  Prior to admission outpatient pharmacy: [facility] Is the patient willing to use [facility] pharmacy at discharge? Yes Is the patient willing to transition their outpatient pharmacy to utilize a [facility] outpatient pharmacy?   Pending     Assessment: 1. Acute on chronic systolic CHF (EF 88-50%), due to ICM. NYHA class II symptoms. - Agree with holding Entresto for now given worsening renal function. Consider restarting once renal function improves. Baseline SCr ~ 1.8 - Continue Jardiance 10 mg daily - Continue hydralazine 100 mg TID - Continue isosorbide mononitrate 120 mg daily - Hold on adding spironolactone given worsening renal function - Hold on adding beta-blocker given marginal CI and Mobitz 1 second-degree AV block.   Plan: 1) Medication changes recommended at this time: - Consider restarting Entresto pending further improvement in renal function  2) Patient assistance: - Delene Loll copay $45/month - Jardiance copay $45/month  - Wilder Glade copay $95/month  3)  Education  - To be completed prior to discharge  Kerby Nora, PharmD, BCPS Heart Failure Stewardship Pharmacist Phone [phone]  Please check [email or web address] for unit-specific pharmacist phone numbers

## 2020-09-27 NOTE — Progress Notes (Signed)
PHARMACY NOTE:  ANTIMICROBIAL RENAL DOSAGE ADJUSTMENT  Current antimicrobial regimen includes a mismatch between antimicrobial dosage and estimated renal function.  As per policy approved by the Pharmacy & Therapeutics and Medical Executive Committees, the antimicrobial dosage will be adjusted accordingly.  Current antimicrobial dosage:  Cefazolin 2g IV every 8 hours  Indication: Surgical prophylaxis  Renal Function:  Estimated Creatinine Clearance: 29 mL/min (A) (by C-G formula based on SCr of 2.08 mg/dL (H)). []      On intermittent HD, scheduled: []      On CRRT    Antimicrobial dosage has been changed to:  Cefazolin 2g IV every 12 hours  Additional comments: Doses will still be completed within 24 hours of surgery as transcribed.    Thank you for allowing pharmacy to be a part of this patient's care.  Sloan Leiter, PharmD, BCPS, BCCCP Clinical Pharmacist Please refer to [facility] for [place] numbers 09/27/2020 4:08 PM

## 2020-09-27 NOTE — Anesthesia Postprocedure Evaluation (Signed)
Anesthesia Post Note  Patient: Corey [name]  Procedure(s) Performed: PERICARDIAL WINDOW WITH THORACOTOMY APPROACH WITH MYOCARDIAL BIOPSY (Left ) TRANSESOPHAGEAL ECHOCARDIOGRAM (TEE) (N/A )     Patient location during evaluation: PACU Anesthesia Type: General Level of consciousness: awake and alert Pain management: pain level controlled Vital Signs Assessment: post-procedure vital signs reviewed and stable Respiratory status: spontaneous breathing, nonlabored ventilation, respiratory function stable and patient connected to nasal cannula oxygen Cardiovascular status: blood pressure returned to baseline and stable Postop Assessment: no apparent nausea or vomiting Anesthetic complications: no   No complications documented.  Last Vitals:  Vitals:   09/27/20 1520 09/27/20 1558  BP: 98/73 115/69  Pulse: 99 94  Resp: (!) 23 (!) 22  Temp: (!) 36.3 C 37.1 C  SpO2: 98% 95%    Last Pain:  Vitals:   09/27/20 1558  TempSrc: Oral  PainSc:                  March Rummage [name]

## 2020-09-27 NOTE — Progress Notes (Signed)
Physical Therapy Treatment Patient Details Name: Corey [name] MRN: [id number] DOB: [birth year] Today's Date: 09/27/2020    History of Present Illness Pt is a 75 y.o. male admitted 09/20/2020 with progressive SOB; workup for diuresis. Heart cath 5/9 showed 80% mid LAD lesion. Plan for pericardial window with myocardial biopsy 5/16. PMH includes CHF, CVA, DOE, HTN, Gout, MI, OSA, PNA, seizures.   PT Comments    Pt progressing with mobility. Today's session focused on transfer and gait training with RW, pt requiring intermittent min guard for balance. Pt endorses bilateral foot pain this session, which he attributes to gout; notes some improvement in pain with increased ambulation distance. SpO2 96% on RA, HR 85-109. Will continue to follow acutely to address established goals.    Follow Up Recommendations  Outpatient PT;Supervision for mobility/OOB     Equipment Recommendations  None recommended by PT    Recommendations for Other Services       Precautions / Restrictions Precautions Precautions: Fall Restrictions Weight Bearing Restrictions: No    Mobility  Bed Mobility Overal bed mobility: Modified Independent                  Transfers Overall transfer level: Needs assistance Equipment used: Rolling walker (2 wheeled) Transfers: Sit to/from Stand Sit to Stand: Min guard         General transfer comment: Pt requesting use of RW; initial stand without assist, but pt losing balance back to sitting EOB; able to maintain balance on second trial, verbal cues for safety/technique, min guard for balance  Ambulation/Gait Ambulation/Gait assistance: Min guard Gait Distance (Feet): 580 Feet Assistive device: Rolling walker (2 wheeled) Gait Pattern/deviations: Step-through pattern;Decreased stride length;Antalgic;Trunk flexed Gait velocity: Decreased   General Gait Details: Pt requesting use of RW for instability and bilateral foot pain; mostly steady gait with RW and  intermittent min guard for balance; pt at times running RW wheel into object, but able to self-correct without verbal cues; no LOB this session while using RW   Stairs Stairs:  (pt declined stair training; reports 4 steps into home without rail; educ on safe technique; pt reports wife able to provide HHA for stability)           Wheelchair Mobility    Modified Rankin (Stroke Patients Only)       Balance Overall balance assessment: Needs assistance Sitting-balance support: No upper extremity supported;Feet supported Sitting balance-Leahy Scale: Fair       Standing balance-Leahy Scale: Fair                              Cognition Arousal/Alertness: Awake/alert Behavior During Therapy: WFL for tasks assessed/performed Overall Cognitive Status: Within Functional Limits for tasks assessed                                 General Comments: WFL for simple tasks; not formally assessed      Exercises      General Comments General comments (skin integrity, edema, etc.): HR 85-109, SpO2 96% on RA. Pt reports owning RW he can use at home      Pertinent Vitals/Pain Pain Assessment: Faces Faces Pain Scale: Hurts a little bit Pain Location: Bilateral feet (pt attributes to gout) Pain Descriptors / Indicators: Grimacing;Burning Pain Intervention(s): Monitored during session    Home Living  Prior Function            PT Goals (current goals can now be found in the care plan section) Progress towards PT goals: Progressing toward goals    Frequency    Min 3X/week      PT Plan Current plan remains appropriate    Co-evaluation              AM-PAC PT "6 Clicks" Mobility   Outcome Measure  Help needed turning from your back to your side while in a flat bed without using bedrails?: None Help needed moving from lying on your back to sitting on the side of a flat bed without using bedrails?: None Help needed  moving to and from a bed to a chair (including a wheelchair)?: A Little Help needed standing up from a chair using your arms (e.g., wheelchair or bedside chair)?: A Little Help needed to walk in hospital room?: A Little Help needed climbing 3-5 steps with a railing? : A Little 6 Click Score: 20    End of Session   Activity Tolerance: Patient tolerated treatment well Patient left: in bed;with call bell/phone within reach;with bed alarm set Nurse Communication: Mobility status PT Visit Diagnosis: Unsteadiness on feet (R26.81);Muscle weakness (generalized) (M62.81);Other abnormalities of gait and mobility (R26.89)     Time: 2951-8841 PT Time Calculation (min) (ACUTE ONLY): 24 min  Charges:  $Gait Training: 23-37 mins                     Mabeline Caras, PT, DPT Acute Rehabilitation Services  Pager [phone] Office [place] 09/27/2020, 9:43 AM

## 2020-09-27 NOTE — Transfer of Care (Signed)
Immediate Anesthesia Transfer of Care Note  Patient: Corey [name]  Procedure(s) Performed: PERICARDIAL WINDOW WITH THORACOTOMY APPROACH WITH MYOCARDIAL BIOPSY (Left ) TRANSESOPHAGEAL ECHOCARDIOGRAM (TEE) (N/A )  Patient Location: PACU  Anesthesia Type:General  Level of Consciousness: awake, alert , oriented, patient cooperative and responds to stimulation  Airway & Oxygen Therapy: Patient Spontanous Breathing and Patient connected to face mask oxygen  Post-op Assessment: Report given to RN, Post -op Vital signs reviewed and stable and Patient moving all extremities X 4  Post vital signs: Reviewed and stable  Last Vitals:  Vitals Value Taken Time  BP 135/95 09/27/20 1433  Temp    Pulse 114 09/27/20 1434  Resp 22 09/27/20 1434  SpO2 98 % 09/27/20 1434  Vitals shown include unvalidated device data.  Last Pain:  Vitals:   09/27/20 1126  TempSrc:   PainSc: 10-Worst pain ever      Patients Stated Pain Goal: 5 (40/98/11 9147)  Complications: No complications documented.

## 2020-09-27 NOTE — Progress Notes (Signed)
Cardiology Progress Note  Patient ID: Corey [name] MRN: [id number] DOB: [birth year] Date of Encounter: 09/27/2020  Primary Cardiologist: Candee Furbish, MD  Subjective   Chief Complaint: None.  HPI: Plan for pericardial window with myocardial biopsy today.  Euvolemic on examination.  Anticipate discharge tomorrow pending surgery today.  ROS:  All other ROS reviewed and negative. Pertinent positives noted in the HPI.     Inpatient Medications  Scheduled Meds: . allopurinol  50 mg Oral q AM  . aspirin EC  81 mg Oral q AM  . colchicine  0.6 mg Oral Daily  . empagliflozin  10 mg Oral Daily  . gabapentin  300 mg Oral QHS  . heparin  5,000 Units Subcutaneous Q8H  . hydrALAZINE  100 mg Oral TID  . insulin aspart  0-15 Units Subcutaneous TID WC  . isosorbide mononitrate  120 mg Oral Daily  . rosuvastatin  40 mg Oral Daily  . sodium chloride flush  3 mL Intravenous Q12H  . sodium chloride flush  3 mL Intravenous Q12H   Continuous Infusions: . sodium chloride    . sodium chloride    . sodium chloride Stopped (09/22/20 1223)  .  ceFAZolin (ANCEF) IV     PRN Meds: sodium chloride, sodium chloride, acetaminophen, albuterol, bismuth subsalicylate, ondansetron (ZOFRAN) IV, senna-docusate, sodium chloride flush, sodium chloride flush   Vital Signs   Vitals:   09/26/20 2240 09/26/20 2243 09/27/20 0453 09/27/20 0454  BP:  127/86 (!) 170/99   Pulse:  96    Resp:  18 20   Temp: 99.9 F (37.7 C)   99.4 F (37.4 C)  TempSrc: Oral   Oral  SpO2: 99% 95%    Weight:    76.3 kg  Height:        Intake/Output Summary (Last 24 hours) at 09/27/2020 0833 Last data filed at 09/27/2020 0455 Gross per 24 hour  Intake 720 ml  Output 900 ml  Net -180 ml   Last 3 Weights 09/27/2020 09/26/2020 09/25/2020  Weight (lbs) 168 lb 3.4 oz 167 lb 12.3 oz 167 lb 12.3 oz  Weight (kg) 76.3 kg 76.1 kg 76.1 kg      Telemetry  Overnight telemetry shows sinus rhythm with first-degree AV block, frequent PVCs  noted, heart rate in the 80s, which I personally reviewed.   Physical Exam   Vitals:   09/26/20 2240 09/26/20 2243 09/27/20 0453 09/27/20 0454  BP:  127/86 (!) 170/99   Pulse:  96    Resp:  18 20   Temp: 99.9 F (37.7 C)   99.4 F (37.4 C)  TempSrc: Oral   Oral  SpO2: 99% 95%    Weight:    76.3 kg  Height:         Intake/Output Summary (Last 24 hours) at 09/27/2020 0833 Last data filed at 09/27/2020 0455 Gross per 24 hour  Intake 720 ml  Output 900 ml  Net -180 ml    Last 3 Weights 09/27/2020 09/26/2020 09/25/2020  Weight (lbs) 168 lb 3.4 oz 167 lb 12.3 oz 167 lb 12.3 oz  Weight (kg) 76.3 kg 76.1 kg 76.1 kg    Body mass index is 27.15 kg/m.  General: Well nourished, well developed, in no acute distress Head: Atraumatic, normal size  Eyes: PEERLA, EOMI  Neck: Supple, JVD 7 to 8 cm of water Endocrine: No thryomegaly Cardiac: Normal S1, S2; RRR; 2 out of 6 systolic ejection murmur Lungs: Clear to auscultation bilaterally, no wheezing, rhonchi or  rales  Abd: Soft, nontender, no hepatomegaly  Ext: No edema, pulses 2+ Musculoskeletal: No deformities, BUE and BLE strength normal and equal Skin: Warm and dry, no rashes   Neuro: Alert and oriented to person, place, time, and situation, CNII-XII grossly intact, no focal deficits  Psych: Normal mood and affect   Labs  High Sensitivity Troponin:  No results for input(s): TROPONINIHS in the last 720 hours.   Cardiac EnzymesNo results for input(s): TROPONINI in the last 168 hours. No results for input(s): TROPIPOC in the last 168 hours.  Chemistry Recent Labs  Lab 09/21/20 0336 09/22/20 0214 09/22/20 0800 09/23/20 0343 09/24/20 0347 09/25/20 0257  NA 143 139   < > 139 140 139  K 3.5 3.6   < > 4.5 4.4 4.2  CL 110 108   < > 105 106 106  CO2 24 24   < > 26 28 26   GLUCOSE 106* 118*   < > 147* 135* 127*  BUN 27* 24*   < > 29* 38* 33*  CREATININE 1.55* 1.63*   < > 2.12* 2.48* 2.21*  CALCIUM 9.7 9.4   < > 9.7 9.5 9.4  PROT 5.6*  5.4*  --  6.2*  --   --   ALBUMIN 3.1* 3.0*  --  3.3*  --   --   AST 25 22  --  25  --   --   ALT 17 16  --  16  --   --   ALKPHOS 63 61  --  67  --   --   BILITOT 0.7 1.3*  --  1.3*  --   --   GFRNONAA 47* 44*   < > 32* 27* 31*  ANIONGAP 9 7   < > 8 6 7    < > = values in this interval not displayed.    Hematology Recent Labs  Lab 09/20/20 1613 09/21/20 0336 09/22/20 0214  WBC 2.7* 3.3* 3.7*  RBC 4.69 4.73 5.28  HGB 12.7* 12.7* 14.3  HCT 40.7 40.7 44.7  MCV 86.8 86.0 84.7  MCH 27.1 26.8 27.1  MCHC 31.2 31.2 32.0  RDW 15.2 15.1 15.0  PLT 186 191 230   BNPNo results for input(s): BNP, PROBNP in the last 168 hours.  DDimer No results for input(s): DDIMER in the last 168 hours.   Radiology  No results found.  Cardiac Studies  TTE 09/21/2020 1. There is no left ventricular thrombus with Definity contrast. Left  ventricular ejection fraction, by estimation, is 35 to 40%. The left  ventricle has moderately decreased function. The left ventricle  demonstrates regional wall motion abnormalities  (see scoring diagram/findings for description). There is global left  ventricular hypokinesis, disproportionately severe in the mid-apical  anterior and anterolateral walls and apex. The anterior septum has  relatively preserved contractility. There is  severe concentric left ventricular hypertrophy. Left ventricular diastolic  parameters are consistent with Grade II diastolic dysfunction  (pseudonormalization). Elevated left atrial pressure.  2. Right ventricular systolic function is moderately reduced. The right  ventricular size is normal. Mildly increased right ventricular wall  thickness. There is moderately elevated pulmonary artery systolic  pressure.  3. Left atrial size was severely dilated.  4. Right atrial size was mildly dilated.  5. Large pericardial effusion. The pericardial effusion is  circumferential. There is no evidence of cardiac tamponade.  6. The mitral  valve is normal in structure. Mild mitral valve  regurgitation.  7. The aortic valve is tricuspid.  Aortic valve regurgitation is not  visualized. No aortic stenosis is present.  8. The inferior vena cava is dilated in size with <50% respiratory  variability, suggesting right atrial pressure of 15 mmHg.   Patient Profile  Corey [name] is a 75 y.o. male with history of chronic pericardial effusion, diastolic heart failure, diabetes, hypertension, CAD, renal insufficiency who was admitted on 09/15/2020 with chest pain and shortness of breath concerning for unstable angina.  He underwent left heart catheterization that showed obstructive mid LAD disease.  Symptoms were more concerning for heart failure and PCI was not performed.  Assessment & Plan   1.  Coronary artery disease/progressive angina -Left heart catheterization with mid LAD disease of 80%.  Symptoms were consistent with diastolic heart failure given very elevated wedge pressure.  PCI was deferred.  No further symptoms of angina. -Would recommend continue aspirin and statin. -Needs more aggressive BP control.  Volume status acceptable.  2.  Acute on chronic diastolic heart failure now with reduced EF, 35 to 40%/severe LVH concerning for amyloid cardiomyopathy -He has very severe LVH.  EF is now 35 to 40%.  He has very low tissue Dopplers as well as restrictive filling on my review.  His cardiomyopathy could be related to hypertension versus amyloid. -He also has a pericardial effusion. -Team last week has decided on pericardial window and we will perform myocardial biopsy at that time. -I have ordered serum and urine immunofixation as well as serum light chains.  He can also have a PYP scan as this would be 100% specific for ATTR amyloid. -Given his significant CKD he has limited options for heart failure medications.  If this is amyloid he is in an advanced state. -No beta-blocker given bradycardia and first-degree AV block -We will  continue hydralazine 100 mg 3 times daily.  I will increase his Imdur to 120 mg daily.  Likely will add clonidine versus a calcium channel blocker for better blood pressure control.  I would not recommend Entresto or an ACE or an ARB.  He has CKD 3 on 4. -We will start torsemide tomorrow. -He is overall to tolerate Jardiance 10 mg daily.  This will help with volume control.  3.  Moderate circumferential pericardial effusion -N.p.o. for pericardial window and biopsy today. -We will recheck a TSH.  4.  Acute on chronic CKD stage III -Recheck kidney function.  It is improving.  He did have an AKI which was related to being on Entresto.  Holding that.  Likely not going to be able to tolerate this medication moving forward.  5.  Bradycardia with first-degree AV block -Beta-blocker being held due to this.  6.  Gout -Patient had low-grade fever on admission.  No sign of infection.  He is on colchicine for gout.  Symptoms have improved.  7.  Diabetes -He is on Jardiance 10 mg daily.  Most recent A1c 6.4.  FEN -no IVF -dvt ppx: heparin sq -diet: NPO for pericardial window -code: full   For questions or updates, please contact [place] Please consult [email or web address] for contact info under   Time Spent with Patient: I have spent a total of 35 minutes with patient reviewing hospital notes, telemetry, EKGs, labs and examining the patient as well as establishing an assessment and plan that was discussed with the patient.  > 50% of time was spent in direct patient care.    Signed, Addison Naegeli. Audie Box, MD, [place]  09/27/2020 8:33 AM

## 2020-09-27 NOTE — Progress Notes (Signed)
Patient admitted to 4E. VS are stable. CHG bath given. Pt is A/O x4. Lft chest tube attached to suction. A-line connected, zeroed, and reading.

## 2020-09-28 ENCOUNTER — Inpatient Hospital Stay (HOSPITAL_COMMUNITY): Payer: Medicare HMO

## 2020-09-28 ENCOUNTER — Encounter (HOSPITAL_COMMUNITY): Payer: Self-pay | Admitting: Cardiothoracic Surgery

## 2020-09-28 LAB — CBC
HCT: 42.4 % (ref 39.0–52.0)
Hemoglobin: 13.4 g/dL (ref 13.0–17.0)
MCH: 27.2 pg (ref 26.0–34.0)
MCHC: 31.6 g/dL (ref 30.0–36.0)
MCV: 86 fL (ref 80.0–100.0)
Platelets: 223 10*3/uL (ref 150–400)
RBC: 4.93 MIL/uL (ref 4.22–5.81)
RDW: 14.6 % (ref 11.5–15.5)
WBC: 9.8 10*3/uL (ref 4.0–10.5)
nRBC: 0 % (ref 0.0–0.2)

## 2020-09-28 LAB — BASIC METABOLIC PANEL
Anion gap: 8 (ref 5–15)
BUN: 39 mg/dL — ABNORMAL HIGH (ref 8–23)
CO2: 22 mmol/L (ref 22–32)
Calcium: 9.5 mg/dL (ref 8.9–10.3)
Chloride: 106 mmol/L (ref 98–111)
Creatinine, Ser: 2.61 mg/dL — ABNORMAL HIGH (ref 0.61–1.24)
GFR, Estimated: 25 mL/min — ABNORMAL LOW (ref >60)
Glucose, Bld: 209 mg/dL — ABNORMAL HIGH (ref 70–99)
Potassium: 5.1 mmol/L (ref 3.5–5.1)
Sodium: 136 mmol/L (ref 135–145)

## 2020-09-28 LAB — GLUCOSE, CAPILLARY
Glucose-Capillary: 219 mg/dL — ABNORMAL HIGH (ref 70–99)
Glucose-Capillary: 205 mg/dL — ABNORMAL HIGH (ref 70–99)
Glucose-Capillary: 111 mg/dL — ABNORMAL HIGH (ref 70–99)
Glucose-Capillary: 183 mg/dL — ABNORMAL HIGH (ref 70–99)

## 2020-09-28 LAB — T3: T3, Total: 73 ng/dL (ref 71–180)

## 2020-09-28 MED ORDER — CARVEDILOL 6.25 MG PO TABS
6.2500 mg | ORAL_TABLET | Freq: Two times a day (BID) | ORAL | Status: DC
  Administered 2020-09-28 – 2020-09-30 (×4): 6.25 mg via ORAL
  Filled 2020-09-28 (×5): qty 1

## 2020-09-28 NOTE — Progress Notes (Signed)
Arterial line d/c'd. Catheter intact. Pressure dressing applied.

## 2020-09-28 NOTE — Progress Notes (Signed)
Heart Failure Stewardship Pharmacist Progress Note   PCP: [name], MD PCP-Cardiologist: Candee Furbish, MD    HPI:  75 yo male with PMH for chronic pericardial effusion, CHF (EF 60-65% in May 2021), DM, CVA in 2014, HTN, MI, CKD3, and OSA. Presented for scheduled cardiac cath on 09/20/20 which revealed severe single-vessel disease of LAD and marginally reduced CI of 2.09. ECHO on 09/21/20 revealed worsened LVEF 35-40%, RV function moderately reduced. Pericardial effusion enlarged, appearance of myocardium concerning for potential amyloidosis, s/p pericardial window on 09/27/20. Plan for medical management of CAD rather than PCI unless he becomes symptomatic again. Anticipated discharge tomorrow or Thursday.   Current HF Medications: Jardiance 10 mg daily  Hydralazine 100 mg TID Isosorbide mononitrate 120 mg daily  Prior to admission HF Medications: PO furosemide 20 mg daily  Losartan 100 mg daily Hydralazine 75 mg TID Isosorbide mononitrate 60 mg BID   Pertinent Lab Values: . Serum creatinine 2.08>2.61, BUN 29>39, Potassium 5.1, Sodium 136, eGFR 33>25  Vital Signs: . Weight: 168 lbs (admission weight: 185 lbs) . Blood pressure: 90/60s . Heart rate: 70-80s  Medication Assistance / Insurance Benefits Check: Does the patient have prescription insurance?  Yes Type of insurance plan: [employer]  Does the patient qualify for medication assistance through manufacturers or grants?   Pending . Eligible grants and/or patient assistance programs: Pending . Medication assistance applications in progress: None  . Medication assistance applications approved: None Approved medication assistance renewals will be completed by: Pending  Outpatient Pharmacy:  Prior to admission outpatient pharmacy: [facility] Is the patient willing to use [facility] pharmacy at discharge? Yes Is the patient willing to transition their outpatient pharmacy to utilize a [facility] outpatient  pharmacy?   Pending    Assessment: 1. Acute on chronic systolic CHF (EF 95-18%), due to ICM. NYHA class II symptoms. - Agree with holding Entresto and Jardiance for now given worsening renal function and significantly lower BP. Consider restarting once renal function improves. Baseline SCr ~ 1.8 - Agree with rechallenging low-dose carvedilol 6.25 mg BID today. Watch second-degree AV block and BP. - Continue hydralazine 100 mg TID - Continue isosorbide mononitrate 120 mg daily - Hold on adding spironolactone given worsening renal function   Plan: 1) Medication changes recommended at this time: - Agree with restarting carvedilol 6.25 mg BID - Consider restarting Entresto and Jardiance pending BP and improvement in renal function  2) Patient assistance: Delene Loll copay $45/month - Jardiance copay $45/month  - Wilder Glade copay $95/month  3)  Education  - To be completed prior to discharge  Richardine Service, PharmD, BCPS Kerby Nora, PharmD, BCPS Heart Failure Stewardship Pharmacist Phone [phone]  Please check [email or web address] for unit-specific pharmacist phone numbers

## 2020-09-28 NOTE — Plan of Care (Signed)

## 2020-09-28 NOTE — Progress Notes (Signed)
Cardiology Progress Note  Patient ID: Corey [name] MRN: [id number] DOB: Jul 04, 1945 Date of Encounter: 09/28/2020  Primary Cardiologist: Candee Furbish, MD  Subjective   Chief Complaint: None.  HPI: Status post pericardial window and myocardial biopsy yesterday.  Chest tube will remain in per surgery.  A lot needs to come out.  BP much improved.  ROS:  All other ROS reviewed and negative. Pertinent positives noted in the HPI.     Inpatient Medications  Scheduled Meds: . acetaminophen  1,000 mg Oral Q6H   Or  . acetaminophen (TYLENOL) oral liquid 160 mg/5 mL  1,000 mg Oral Q6H  . allopurinol  50 mg Oral q AM  . aspirin EC  81 mg Oral q AM  . bisacodyl  10 mg Oral Daily  . colchicine  0.6 mg Oral Daily  . empagliflozin  10 mg Oral Daily  . gabapentin  300 mg Oral QHS  . hydrALAZINE  100 mg Oral TID  . insulin aspart  0-15 Units Subcutaneous TID WC  . isosorbide mononitrate  120 mg Oral Daily  . rosuvastatin  40 mg Oral Daily  . senna-docusate  1 tablet Oral QHS   Continuous Infusions: . sodium chloride     PRN Meds: sodium chloride, acetaminophen, albuterol, bismuth subsalicylate, morphine injection, ondansetron (ZOFRAN) IV, oxyCODONE, senna-docusate, traMADol   Vital Signs   Vitals:   09/27/20 1950 09/27/20 2300 09/28/20 0302 09/28/20 0754  BP:  114/79 104/65 (!) 124/91  Pulse:  76 82 80  Resp: 20 20 16 19   Temp:  98.8 F (37.1 C) 98.2 F (36.8 C) 98.7 F (37.1 C)  TempSrc:  Oral Oral Oral  SpO2:  97% 97% 96%  Weight:      Height:        Intake/Output Summary (Last 24 hours) at 09/28/2020 0947 Last data filed at 09/28/2020 0622 Gross per 24 hour  Intake 1300.17 ml  Output 890 ml  Net 410.17 ml   Last 3 Weights 09/27/2020 09/27/2020 09/26/2020  Weight (lbs) 168 lb 3.4 oz 168 lb 3.4 oz 167 lb 12.3 oz  Weight (kg) 76.3 kg 76.3 kg 76.1 kg      Telemetry  Overnight telemetry shows normal sinus rhythm heart rate in the 70s, brief PVCs noted, which I personally  reviewed.   Physical Exam   Vitals:   09/27/20 1950 09/27/20 2300 09/28/20 0302 09/28/20 0754  BP:  114/79 104/65 (!) 124/91  Pulse:  76 82 80  Resp: 20 20 16 19   Temp:  98.8 F (37.1 C) 98.2 F (36.8 C) 98.7 F (37.1 C)  TempSrc:  Oral Oral Oral  SpO2:  97% 97% 96%  Weight:      Height:         Intake/Output Summary (Last 24 hours) at 09/28/2020 0947 Last data filed at 09/28/2020 0622 Gross per 24 hour  Intake 1300.17 ml  Output 890 ml  Net 410.17 ml    Last 3 Weights 09/27/2020 09/27/2020 09/26/2020  Weight (lbs) 168 lb 3.4 oz 168 lb 3.4 oz 167 lb 12.3 oz  Weight (kg) 76.3 kg 76.3 kg 76.1 kg    Body mass index is 27.15 kg/m.  General: Well nourished, well developed, in no acute distress Head: Atraumatic, normal size  Eyes: PEERLA, EOMI  Neck: Supple, no JVD Endocrine: No thryomegaly Cardiac: Normal S1, S2; RRR; no murmurs, rubs, or gallops Lungs: Clear to auscultation bilaterally, no wheezing, rhonchi or rales  Abd: Soft, nontender, no hepatomegaly  Ext: No  edema, pulses 2+ Musculoskeletal: No deformities, BUE and BLE strength normal and equal Skin: Warm and dry, no rashes   Neuro: Alert and oriented to person, place, time, and situation, CNII-XII grossly intact, no focal deficits  Psych: Normal mood and affect   Labs  High Sensitivity Troponin:  No results for input(s): TROPONINIHS in the last 720 hours.   Cardiac EnzymesNo results for input(s): TROPONINI in the last 168 hours. No results for input(s): TROPIPOC in the last 168 hours.  Chemistry Recent Labs  Lab 09/22/20 0214 09/22/20 0800 09/23/20 0343 09/24/20 0347 09/25/20 0257 09/27/20 0846 09/28/20 0500  NA 139   < > 139   < > 139 139 136  K 3.6   < > 4.5   < > 4.2 4.4 5.1  CL 108   < > 105   < > 106 107 106  CO2 24   < > 26   < > 26 25 22   GLUCOSE 118*   < > 147*   < > 127* 123* 209*  BUN 24*   < > 29*   < > 33* 29* 39*  CREATININE 1.63*   < > 2.12*   < > 2.21* 2.08* 2.61*  CALCIUM 9.4   < > 9.7   <  > 9.4 9.7 9.5  PROT 5.4*  --  6.2*  --   --   --   --   ALBUMIN 3.0*  --  3.3*  --   --   --   --   AST 22  --  25  --   --   --   --   ALT 16  --  16  --   --   --   --   ALKPHOS 61  --  67  --   --   --   --   BILITOT 1.3*  --  1.3*  --   --   --   --   GFRNONAA 44*   < > 32*   < > 31* 33* 25*  ANIONGAP 7   < > 8   < > 7 7 8    < > = values in this interval not displayed.    Hematology Recent Labs  Lab 09/22/20 0214 09/27/20 0846 09/28/20 0500  WBC 3.7* 5.1 9.8  RBC 5.28 5.31 4.93  HGB 14.3 14.5 13.4  HCT 44.7 45.2 42.4  MCV 84.7 85.1 86.0  MCH 27.1 27.3 27.2  MCHC 32.0 32.1 31.6  RDW 15.0 14.6 14.6  PLT 230 196 223   BNPNo results for input(s): BNP, PROBNP in the last 168 hours.  DDimer No results for input(s): DDIMER in the last 168 hours.   Radiology  DG CHEST PORT 1 VIEW  Result Date: 09/28/2020 CLINICAL DATA:  Status post pericardial window EXAM: PORTABLE CHEST 1 VIEW COMPARISON:  09/27/2020 FINDINGS: Lung volumes are small, but are symmetric and are stable since prior examination. Left basilar atelectasis persists. Left basilar chest tube is unchanged. No pneumothorax or pleural effusion. Moderate cardiomegaly persists, stable since prior examination. The central pulmonary arteries are enlarged in keeping with changes of pulmonary arterial hypertension. No superimposed overt pulmonary edema. No acute bone abnormality. IMPRESSION: Stable examination. Pulmonary hypoinflation. Left chest tube in place without evidence of pneumothorax. Electronically Signed   By: Fidela Salisbury MD   On: 09/28/2020 06:33   DG Chest Port 1 View  Result Date: 09/27/2020 CLINICAL DATA:  Status post pericardial window EXAM: PORTABLE CHEST  1 VIEW COMPARISON:  05/06/2020 FINDINGS: Cardiac shadow is enlarged but slightly decreased when compared with the prior exam. Lungs are hypoinflated. No definitive pneumothorax is seen. Extrinsic skin fold is noted over the left base. Some subcutaneous emphysema  is noted related to the recent surgery. No bony abnormality is seen. IMPRESSION: Poor inspiratory effort with persistent cardiomegaly. Mild subcutaneous emphysema is noted. Electronically Signed   By: Inez Catalina M.D.   On: 09/27/2020 17:57    Cardiac Studies  TTE 09/21/2020 1. There is no left ventricular thrombus with Definity contrast. Left  ventricular ejection fraction, by estimation, is 35 to 40%. The left  ventricle has moderately decreased function. The left ventricle  demonstrates regional wall motion abnormalities  (see scoring diagram/findings for description). There is global left  ventricular hypokinesis, disproportionately severe in the mid-apical  anterior and anterolateral walls and apex. The anterior septum has  relatively preserved contractility. There is  severe concentric left ventricular hypertrophy. Left ventricular diastolic  parameters are consistent with Grade II diastolic dysfunction  (pseudonormalization). Elevated left atrial pressure.  2. Right ventricular systolic function is moderately reduced. The right  ventricular size is normal. Mildly increased right ventricular wall  thickness. There is moderately elevated pulmonary artery systolic  pressure.  3. Left atrial size was severely dilated.  4. Right atrial size was mildly dilated.  5. Large pericardial effusion. The pericardial effusion is  circumferential. There is no evidence of cardiac tamponade.  6. The mitral valve is normal in structure. Mild mitral valve  regurgitation.  7. The aortic valve is tricuspid. Aortic valve regurgitation is not  visualized. No aortic stenosis is present.  8. The inferior vena cava is dilated in size with <50% respiratory  variability, suggesting right atrial pressure of 15 mmHg.   LHC/RHC 09/20/2020 SUMMARY  Severe single-vessel disease with heavily calcified mid LAD 80% stenosis  Acute Combined Systolic and Diastolic Heart Failure (systolic function appears  to be reduced, to conserve contrast, LV gram not performed), but LVEDP 32 mmHg and PCWP 28 mmHg.  Systemic Hypertension with Mean Arterial Pressure 123 mmHg  Secondary Pulmonary Hypertension: PAP 67/19 mmHg-mean 39 mmHg with RVP-EDP of 67/2 mmHg-15 mmHg, RVP and RAP of 14 mmHg.  Moderate to severely reduced Cardiac Output-Index: 4.04-2.09   RECOMMENDATIONS  With significant CHF, orthopnea and PND with LVEDP of 32 mmHg, patient is not stable for LAD PCI which would likely require atherectomy. ? We will admit to cardiology service for titration of cardiac meds and diuresis: Converting losartan to [facility], otherwise continue home medications. ? IV Lasix 40 mg twice daily ? Check 2D echo ? Reassess volume status in the next 1 to 2 days to determine feasibility for staged LAD PCI (via femoral access due to extreme tortuosity of the innominate artery.)   Slight scale insulin; consider adding Wilder Glade or Jardiance    Patient Profile  MAYS PAINO is a 75 y.o. male with history of chronic pericardial effusion, diastolic heart failure, diabetes, hypertension, CAD, renal insufficiency who was admitted on 09/15/2020 with chest pain and shortness of breath concerning for unstable angina.  He underwent left heart catheterization that showed obstructive mid LAD disease.  Symptoms were more concerning for heart failure and PCI was not performed.  Assessment & Plan   1.  CAD/progressive angina -Left heart cath with 80% mid LAD disease.  Symptoms were thought to be more related to volume overload.  Symptoms have improved with diuresis.  PCI's been deferred. -We will continue medical management  for CAD. -Aspirin and statin.  2.  Acute on chronic diastolic heart failure now with reduced EF, 35-40%/severe LVH with concerns for amyloid cardiomyopathy. -Admitted with a pericardial effusion as well as diastolic heart failure in the past.  Now with reduced EF.  He has restrictive filling.  He has severe  LVH. -Underwent pericardial window yesterday.  Biopsy has been performed as well.  I have also sent serum and urine immunofixation as well as serum light chains.  If he is still here tomorrow we will obtain a PYP.  He may need tafamidis which can be started as an outpatient. -He does have significant CKD. -The only thing that does not fit with amyloid cardiomyopathy is severe hypertension.  This could just be hypertensive heart disease. -Beta-blocker was held due to first-degree block.  I would like to challenge him on a beta-blocker.  He may tolerate Coreg.  This would be beneficial for his cardiomyopathy. -No ACE/ARB/MRA given his CKD.  Continue hydralazine 100 mg 3 times daily.  On Imdur 120 mg daily. -Would like to hold torsemide.  He appears euvolemic. -Holding Jardiance given creatinine bump.  3.  Pericardial window/myocardial biopsy/chest tube -Still with significant drainage.  CT surgery would like to leave in 1 more day.  4.  AKI -Slight bump in creatinine.  Hold Jardiance as above.  Suspect this is related to surgery.  Hold further diuresis today.  He is euvolemic.  5.  Diabetes -Hold Jardiance given rising creatinine.  Most recent A1c 6.4.  Sliding scale insulin.  6.  Gout -Had a gout flare this admission.  On colchicine.  7.  Bradycardia with first-degree block. -Rechallenge beta-blocker today.  FEN -No intravenous fluids -Diet: Salt restricted -DVT PPx: SCDs given chest tube.  Restart subcu heparin when able. -Code: Full  For questions or updates, please contact [place] Please consult [email or web address] for contact info under   Time Spent with Patient: I have spent a total of 35 minutes with patient reviewing hospital notes, telemetry, EKGs, labs and examining the patient as well as establishing an assessment and plan that was discussed with the patient.  > 50% of time was spent in direct patient care.    Signed, Addison Naegeli. Audie Box, MD, [place]  09/28/2020 9:47 AM

## 2020-09-28 NOTE — Care Management Important Message (Signed)
Important Message  Patient Details  Name: Corey [name] MRN: [id number] Date of Birth: Aug 24, 1945   Medicare Important Message Given:  Yes     [name] 09/28/2020, 10:03 AM

## 2020-09-28 NOTE — Progress Notes (Addendum)
[address]       [place] [address]             [phone]      1 Day Post-Op Procedure(s) (LRB): PERICARDIAL WINDOW WITH THORACOTOMY APPROACH WITH MYOCARDIAL BIOPSY (Left) TRANSESOPHAGEAL ECHOCARDIOGRAM (TEE) (N/A) Subjective: Feels ok, no specific c/o  Objective: Vital signs in last 24 hours: Temp:  [97 F (36.1 C)-99.6 F (37.6 C)] 98.7 F (37.1 C) (05/17 0754) Pulse Rate:  [76-108] 80 (05/17 0754) Cardiac Rhythm: Normal sinus rhythm (05/17 0340) Resp:  [16-26] 19 (05/17 0754) BP: (92-155)/(62-95) 124/91 (05/17 0754) SpO2:  [92 %-100 %] 96 % (05/17 0754) Arterial Line BP: (136-151)/(53-65) 143/53 (05/16 1558) Weight:  [76.3 kg] 76.3 kg (05/16 1126)  Hemodynamic parameters for last 24 hours:    Intake/Output from previous day: 05/16 0701 - 05/17 0700 In: 1300.2 [P.O.:120; I.V.:830.2; IV Piggyback:300] Out: 890 [Urine:665; Blood:5; Chest Tube:220] Intake/Output this shift: No intake/output data recorded.  General appearance: alert, cooperative and no distress Heart: regular rate and rhythm Lungs: clear to auscultation bilaterally Abdomen: benign Extremities: no edema- PAS in place Wound: dressing with minor bloody drainage + rub with CT in place   Lab Results: Recent Labs    09/27/20 0846 09/28/20 0500  WBC 5.1 9.8  HGB 14.5 13.4  HCT 45.2 42.4  PLT 196 223   BMET:  Recent Labs    09/27/20 0846 09/28/20 0500  NA 139 136  K 4.4 5.1  CL 107 106  CO2 25 22  GLUCOSE 123* 209*  BUN 29* 39*  CREATININE 2.08* 2.61*  CALCIUM 9.7 9.5    PT/INR:  Recent Labs    09/26/20 1447  LABPROT 13.8  INR 1.1   ABG    Component Value Date/Time   PHART 7.341 (L) 09/20/2020 1337   HCO3 22.9 09/20/2020 1355   HCO3 23.5 09/20/2020 1355   TCO2 24 09/20/2020 1355   TCO2 25 09/20/2020 1355   ACIDBASEDEF 3.0 (H) 09/20/2020 1355   ACIDBASEDEF 3.0 (H) 09/20/2020 1355   O2SAT 55.0 09/20/2020 1355   O2SAT 58.0 09/20/2020 1355   CBG  (last 3)  Recent Labs    09/27/20 1616 09/27/20 1949 09/28/20 0612  GLUCAP 171* 215* 219*    Meds Scheduled Meds: . acetaminophen  1,000 mg Oral Q6H   Or  . acetaminophen (TYLENOL) oral liquid 160 mg/5 mL  1,000 mg Oral Q6H  . allopurinol  50 mg Oral q AM  . aspirin EC  81 mg Oral q AM  . bisacodyl  10 mg Oral Daily  . colchicine  0.6 mg Oral Daily  . empagliflozin  10 mg Oral Daily  . gabapentin  300 mg Oral QHS  . hydrALAZINE  100 mg Oral TID  . insulin aspart  0-15 Units Subcutaneous TID WC  . isosorbide mononitrate  120 mg Oral Daily  . rosuvastatin  40 mg Oral Daily  . senna-docusate  1 tablet Oral QHS   Continuous Infusions: . sodium chloride    .  ceFAZolin (ANCEF) IV 2 g (09/27/20 2255)   PRN Meds:.sodium chloride, acetaminophen, albuterol, bismuth subsalicylate, morphine injection, ondansetron (ZOFRAN) IV, oxyCODONE, senna-docusate, traMADol  Xrays DG CHEST PORT 1 VIEW  Result Date: 09/28/2020 CLINICAL DATA:  Status post pericardial window EXAM: PORTABLE CHEST 1 VIEW COMPARISON:  09/27/2020 FINDINGS: Lung volumes are small, but are symmetric and are stable since prior examination. Left basilar atelectasis persists. Left basilar chest tube is unchanged. No pneumothorax  or pleural effusion. Moderate cardiomegaly persists, stable since prior examination. The central pulmonary arteries are enlarged in keeping with changes of pulmonary arterial hypertension. No superimposed overt pulmonary edema. No acute bone abnormality. IMPRESSION: Stable examination. Pulmonary hypoinflation. Left chest tube in place without evidence of pneumothorax. Electronically Signed   By: Fidela Salisbury MD   On: 09/28/2020 06:33   DG Chest Port 1 View  Result Date: 09/27/2020 CLINICAL DATA:  Status post pericardial window EXAM: PORTABLE CHEST 1 VIEW COMPARISON:  05/06/2020 FINDINGS: Cardiac shadow is enlarged but slightly decreased when compared with the prior exam. Lungs are hypoinflated. No  definitive pneumothorax is seen. Extrinsic skin fold is noted over the left base. Some subcutaneous emphysema is noted related to the recent surgery. No bony abnormality is seen. IMPRESSION: Poor inspiratory effort with persistent cardiomegaly. Mild subcutaneous emphysema is noted. Electronically Signed   By: Inez Catalina M.D.   On: 09/27/2020 17:57    Assessment/Plan: S/P Procedure(s) (LRB): PERICARDIAL WINDOW WITH THORACOTOMY APPROACH WITH MYOCARDIAL BIOPSY (Left) TRANSESOPHAGEAL ECHOCARDIOGRAM (TEE) (N/A)   1 Tmax 99.6, sBP 90's to 150's 2 sats good on RA 3 CXR is stable in appearance 4 CT- 220 cc- will leave for now 5 creat up a bit from baseline at 2.6 6 H/h are pretty stable 7 BS - fair control        LOS: 8 days    John Giovanni PA-C Pager [phone] 09/28/2020 Pt seen and examined; agree with documentation. Likely chest tube out in am. Clint Biello Z. Orvan Seen, [place]

## 2020-09-28 NOTE — Progress Notes (Signed)
Heart Failure Nurse Navigator Progress Note  Visited with patient and spouse post op day 1. Pt resting in bed with pericardial tube in place to gravity. Pt c/o some pain when taking deep breaths or coughing, explained to patient this is normal and encouraged cough/deep breathing/pulm toileting. SCr 2.61 today, expect renal function to return to baseline per prior provider notes.   Scheduled HV TOC appt for Friday, May 27 @ 10AM per spouse request. Potential DC Wed/Thurs., will adjust as necessary pending discharge date.   Confirmed pt has private transportation driven by spouse.   Education Assessment and Provision:  Detailed education and instructions provided on heart failure disease management including the following:  Signs and symptoms of Heart Failure When to call the physician Importance of daily weights Low sodium diet Fluid restriction Medication management Anticipated future follow-up appointments  Patient education given on each of the above topics.  Patient acknowledges understanding via teach back method and acceptance of all instructions.  Education Materials:  "Living Better With Heart Failure" Booklet, HF zone tool, & Daily Weight Tracker Tool.  Patient has scale at home: yes Patient has pill box at home: yes  Pricilla Holm, RN, BSN Heart Failure Nurse Navigator [phone]

## 2020-09-28 NOTE — Progress Notes (Signed)
Mobility Specialist: Progress Note   09/28/20 1335  Mobility  Activity Ambulated in hall  Level of Assistance Minimal assist, patient does 75% or more  Assistive Device Front wheel walker  Distance Ambulated (ft) 470 ft  Mobility Ambulated with assistance in hallway  Mobility Response Tolerated well  Mobility performed by Mobility specialist  $Mobility charge 1 Mobility   Pre-Mobility: 82 HR Post-Mobility: 85 HR, 110/68 BP  Pt said he felt dizzy upon standing but said he felt fine to ambulate. Pt said he does not use AD at home so independent to start. Pt unsteady in room so RW utilized for remainder of session. Pt otherwise asx during ambulation. Pt back to bed after walk with call bell at his side.   [facility] Celica Kotowski Mobility Specialist Mobility Specialist Phone: [phone]

## 2020-09-29 ENCOUNTER — Other Ambulatory Visit (HOSPITAL_COMMUNITY): Payer: Medicare HMO

## 2020-09-29 LAB — CBC
HCT: 44 % (ref 39.0–52.0)
Hemoglobin: 13.6 g/dL (ref 13.0–17.0)
MCH: 27.1 pg (ref 26.0–34.0)
MCHC: 30.9 g/dL (ref 30.0–36.0)
MCV: 87.6 fL (ref 80.0–100.0)
Platelets: 218 10*3/uL (ref 150–400)
RBC: 5.02 MIL/uL (ref 4.22–5.81)
RDW: 14.5 % (ref 11.5–15.5)
WBC: 8.2 10*3/uL (ref 4.0–10.5)
nRBC: 0 % (ref 0.0–0.2)

## 2020-09-29 LAB — GLUCOSE, CAPILLARY
Glucose-Capillary: 125 mg/dL — ABNORMAL HIGH (ref 70–99)
Glucose-Capillary: 188 mg/dL — ABNORMAL HIGH (ref 70–99)
Glucose-Capillary: 132 mg/dL — ABNORMAL HIGH (ref 70–99)
Glucose-Capillary: 212 mg/dL — ABNORMAL HIGH (ref 70–99)

## 2020-09-29 LAB — BASIC METABOLIC PANEL
Anion gap: 7 (ref 5–15)
BUN: 39 mg/dL — ABNORMAL HIGH (ref 8–23)
CO2: 23 mmol/L (ref 22–32)
Calcium: 9.6 mg/dL (ref 8.9–10.3)
Chloride: 107 mmol/L (ref 98–111)
Creatinine, Ser: 2.55 mg/dL — ABNORMAL HIGH (ref 0.61–1.24)
GFR, Estimated: 26 mL/min — ABNORMAL LOW (ref >60)
Glucose, Bld: 118 mg/dL — ABNORMAL HIGH (ref 70–99)
Potassium: 5 mmol/L (ref 3.5–5.1)
Sodium: 137 mmol/L (ref 135–145)

## 2020-09-29 LAB — SURGICAL PATHOLOGY

## 2020-09-29 LAB — CYTOLOGY - NON PAP

## 2020-09-29 MED ORDER — HEPARIN SODIUM (PORCINE) 5000 UNIT/ML IJ SOLN
5000.0000 [IU] | Freq: Three times a day (TID) | INTRAMUSCULAR | Status: DC
  Administered 2020-09-29 – 2020-09-30 (×3): 5000 [IU] via SUBCUTANEOUS
  Filled 2020-09-29 (×3): qty 1

## 2020-09-29 NOTE — Progress Notes (Signed)
Cardiology Progress Note  Patient ID: Corey [name] MRN: [id number] DOB: December 30, 1945 Date of Encounter: 09/29/2020  Primary Cardiologist: Candee Furbish, MD  Subjective   Chief Complaint: None.  HPI: 300 cc output from chest tube.  Surgery has recommended this to remain in 1 more day.  Blood pressure is better.  Hopefully chest tube will come out soon he can go home.  ROS:  All other ROS reviewed and negative. Pertinent positives noted in the HPI.     Inpatient Medications  Scheduled Meds: . acetaminophen  1,000 mg Oral Q6H   Or  . acetaminophen (TYLENOL) oral liquid 160 mg/5 mL  1,000 mg Oral Q6H  . allopurinol  50 mg Oral q AM  . aspirin EC  81 mg Oral q AM  . bisacodyl  10 mg Oral Daily  . carvedilol  6.25 mg Oral BID WC  . colchicine  0.6 mg Oral Daily  . gabapentin  300 mg Oral QHS  . hydrALAZINE  100 mg Oral TID  . insulin aspart  0-15 Units Subcutaneous TID WC  . isosorbide mononitrate  120 mg Oral Daily  . rosuvastatin  40 mg Oral Daily  . senna-docusate  1 tablet Oral QHS   Continuous Infusions: . sodium chloride     PRN Meds: sodium chloride, acetaminophen, albuterol, bismuth subsalicylate, morphine injection, ondansetron (ZOFRAN) IV, oxyCODONE, senna-docusate, traMADol   Vital Signs   Vitals:   09/28/20 2139 09/28/20 2340 09/29/20 0400 09/29/20 0803  BP: 126/81 117/73 128/89 119/79  Pulse:  70 78 78  Resp:  20 17 20   Temp:  99 F (37.2 C) 99.9 F (37.7 C) 99.8 F (37.7 C)  TempSrc:  Oral Oral Oral  SpO2:  95% 96% 94%  Weight:   77.2 kg   Height:        Intake/Output Summary (Last 24 hours) at 09/29/2020 0856 Last data filed at 09/29/2020 0640 Gross per 24 hour  Intake --  Output 496 ml  Net -496 ml   Last 3 Weights 09/29/2020 09/27/2020 09/27/2020  Weight (lbs) 170 lb 3.1 oz 168 lb 3.4 oz 168 lb 3.4 oz  Weight (kg) 77.2 kg 76.3 kg 76.3 kg      Telemetry  Overnight telemetry shows sinus rhythm heart rate in 70s, first-degree AV block noted, which I  personally reviewed.    Physical Exam   Vitals:   09/28/20 2139 09/28/20 2340 09/29/20 0400 09/29/20 0803  BP: 126/81 117/73 128/89 119/79  Pulse:  70 78 78  Resp:  20 17 20   Temp:  99 F (37.2 C) 99.9 F (37.7 C) 99.8 F (37.7 C)  TempSrc:  Oral Oral Oral  SpO2:  95% 96% 94%  Weight:   77.2 kg   Height:         Intake/Output Summary (Last 24 hours) at 09/29/2020 0856 Last data filed at 09/29/2020 0640 Gross per 24 hour  Intake --  Output 496 ml  Net -496 ml    Last 3 Weights 09/29/2020 09/27/2020 09/27/2020  Weight (lbs) 170 lb 3.1 oz 168 lb 3.4 oz 168 lb 3.4 oz  Weight (kg) 77.2 kg 76.3 kg 76.3 kg    Body mass index is 27.47 kg/m.   General: Well nourished, well developed, in no acute distress Head: Atraumatic, normal size  Eyes: PEERLA, EOMI  Neck: Supple, JVD 5 to 7 cm of water Endocrine: No thryomegaly Cardiac: Normal S1, S2; RRR; no murmurs, rubs, or gallops Lungs: Diminished breath sounds bilaterally Abd: Soft,  nontender, no hepatomegaly  Ext: No edema, pulses 2+ Musculoskeletal: No deformities, BUE and BLE strength normal and equal Skin: Warm and dry, no rashes   Neuro: Alert and oriented to person, place, time, and situation, CNII-XII grossly intact, no focal deficits  Psych: Normal mood and affect   Labs  High Sensitivity Troponin:  No results for input(s): TROPONINIHS in the last 720 hours.   Cardiac EnzymesNo results for input(s): TROPONINI in the last 168 hours. No results for input(s): TROPIPOC in the last 168 hours.  Chemistry Recent Labs  Lab 09/23/20 0343 09/24/20 0347 09/27/20 0846 09/28/20 0500 09/29/20 0153  NA 139   < > 139 136 137  K 4.5   < > 4.4 5.1 5.0  CL 105   < > 107 106 107  CO2 26   < > 25 22 23   GLUCOSE 147*   < > 123* 209* 118*  BUN 29*   < > 29* 39* 39*  CREATININE 2.12*   < > 2.08* 2.61* 2.55*  CALCIUM 9.7   < > 9.7 9.5 9.6  PROT 6.2*  --   --   --   --   ALBUMIN 3.3*  --   --   --   --   AST 25  --   --   --   --   ALT  16  --   --   --   --   ALKPHOS 67  --   --   --   --   BILITOT 1.3*  --   --   --   --   GFRNONAA 32*   < > 33* 25* 26*  ANIONGAP 8   < > 7 8 7    < > = values in this interval not displayed.    Hematology Recent Labs  Lab 09/27/20 0846 09/28/20 0500 09/29/20 0153  WBC 5.1 9.8 8.2  RBC 5.31 4.93 5.02  HGB 14.5 13.4 13.6  HCT 45.2 42.4 44.0  MCV 85.1 86.0 87.6  MCH 27.3 27.2 27.1  MCHC 32.1 31.6 30.9  RDW 14.6 14.6 14.5  PLT 196 223 218   BNPNo results for input(s): BNP, PROBNP in the last 168 hours.  DDimer No results for input(s): DDIMER in the last 168 hours.   Radiology  DG CHEST PORT 1 VIEW  Result Date: 09/28/2020 CLINICAL DATA:  Status post pericardial window EXAM: PORTABLE CHEST 1 VIEW COMPARISON:  09/27/2020 FINDINGS: Lung volumes are small, but are symmetric and are stable since prior examination. Left basilar atelectasis persists. Left basilar chest tube is unchanged. No pneumothorax or pleural effusion. Moderate cardiomegaly persists, stable since prior examination. The central pulmonary arteries are enlarged in keeping with changes of pulmonary arterial hypertension. No superimposed overt pulmonary edema. No acute bone abnormality. IMPRESSION: Stable examination. Pulmonary hypoinflation. Left chest tube in place without evidence of pneumothorax. Electronically Signed   By: Fidela Salisbury MD   On: 09/28/2020 06:33   DG Chest Port 1 View  Result Date: 09/27/2020 CLINICAL DATA:  Status post pericardial window EXAM: PORTABLE CHEST 1 VIEW COMPARISON:  05/06/2020 FINDINGS: Cardiac shadow is enlarged but slightly decreased when compared with the prior exam. Lungs are hypoinflated. No definitive pneumothorax is seen. Extrinsic skin fold is noted over the left base. Some subcutaneous emphysema is noted related to the recent surgery. No bony abnormality is seen. IMPRESSION: Poor inspiratory effort with persistent cardiomegaly. Mild subcutaneous emphysema is noted. Electronically  Signed   By: Inez Catalina  M.D.   On: 09/27/2020 17:57    Cardiac Studies  TTE 09/21/2020 1. There is no left ventricular thrombus with Definity contrast. Left  ventricular ejection fraction, by estimation, is 35 to 40%. The left  ventricle has moderately decreased function. The left ventricle  demonstrates regional wall motion abnormalities  (see scoring diagram/findings for description). There is global left  ventricular hypokinesis, disproportionately severe in the mid-apical  anterior and anterolateral walls and apex. The anterior septum has  relatively preserved contractility. There is  severe concentric left ventricular hypertrophy. Left ventricular diastolic  parameters are consistent with Grade II diastolic dysfunction  (pseudonormalization). Elevated left atrial pressure.  2. Right ventricular systolic function is moderately reduced. The right  ventricular size is normal. Mildly increased right ventricular wall  thickness. There is moderately elevated pulmonary artery systolic  pressure.  3. Left atrial size was severely dilated.  4. Right atrial size was mildly dilated.  5. Large pericardial effusion. The pericardial effusion is  circumferential. There is no evidence of cardiac tamponade.  6. The mitral valve is normal in structure. Mild mitral valve  regurgitation.  7. The aortic valve is tricuspid. Aortic valve regurgitation is not  visualized. No aortic stenosis is present.  8. The inferior vena cava is dilated in size with <50% respiratory  variability, suggesting right atrial pressure of 15 mmHg.  Patient Profile  Corey [name] a 75 y.o.malewith history of chronic pericardial effusion, diastolic heart failure, diabetes, hypertension, CAD, renal insufficiency who was admitted on 09/15/2020 with chest pain and shortness of breath concerning for unstable angina. He underwent left heart catheterization that showed obstructive mid LAD disease. Symptoms were more  concerning for heart failure and PCI was not performed.  Assessment & Plan   1.  CAD/progressive angina -Initially admitted with concerns for unstable angina.  80% mid LAD lesion noted on left heart cath.  This was deferred due to gross volume overload and symptoms were consistent with heart failure. -No symptoms of chest pain.  Would recommend medical management for CAD at this point. -Continue aspirin and statin.  He has been challenged with a beta-blocker and is tolerating this.  2.  Systolic heart failure EF 35-40%/severe LVH/concern for cardiac amyloidosis -Admitted and found to have a moderate pericardial effusion.  Underwent pericardial window with biopsy on 09/27/2020.  Pathology is pending. -He does have restrictive filling. -He has severe hypertension and LVH. -We will await the results of his biopsy.  I have ordered serum and urine immunofixation.  I also ordered serum light chains. -He likely can forego PYP as he has biopsy pending. -Regarding cardiomyopathy he has been rechallenged on Coreg 6.25 mg twice daily.  Seems to be tolerating this well.  I would continue this. -No ACE/ARB/MRA given CKD.  He is on hydralazine 100 mg 3 times daily and Imdur 120 mg daily. -BP is much improved. -Holding torsemide he is euvolemic.  He will resume this at discharge.  3.  Pericardial effusion status post pericardial window -300 cc output from chest tube.  Surgery is recommended to remain in 1 more day. -We will repeat a limited echocardiogram to evaluate the effusion.  I suspect it is not there.  He is hemodynamically stable.  4.  AKI -Kidney function slowly improving.  Likely related to surgery and accelerated hypertension.  5.  Bradycardia with first-degree AV block -Tolerating Coreg.  No issues.  FEN -No intravenous fluids -Diet: Salt restricted -dvt ppx: Subcutaneous heparin -Code: Full -Disposition: Discharge after chest tube  removal.  For questions or updates, please contact  [place] Please consult [email or web address] for contact info under   Time Spent with Patient: I have spent a total of 25 minutes with patient reviewing hospital notes, telemetry, EKGs, labs and examining the patient as well as establishing an assessment and plan that was discussed with the patient.  > 50% of time was spent in direct patient care.    Signed, Addison Naegeli. Audie Box, MD, [place]  09/29/2020 8:56 AM

## 2020-09-29 NOTE — Progress Notes (Signed)
Physical Therapy Treatment Patient Details Name: Corey [name] MRN: [id number] DOB: 1945/07/06 Today's Date: 09/29/2020    History of Present Illness Pt is a 75 y.o. male admitted 09/20/2020 with progressive SOB; workup for diuresis. Heart cath 5/9 showed 80% mid LAD lesion. Plan for pericardial window with myocardial biopsy 5/16. PMH includes CHF, CVA, DOE, HTN, Gout, MI, OSA, PNA, seizures.    PT Comments    Pt received up exiting room with assist from mobility tech, agreeable to gait progression and stair navigation training (+2 assist for safety in stairwell). Pt needing mostly min guard for functional mobility tasks but did have LOB pivoting to chair with B HHA needing modA from +2 staff to regain balance. Pt continues to benefit from PT services to progress toward functional mobility goals. Continue to recommend OPPT, pending acute progress.   Follow Up Recommendations  Outpatient PT;Supervision for mobility/OOB     Equipment Recommendations  None recommended by PT    Recommendations for Other Services       Precautions / Restrictions Precautions Precautions: Fall Precaution Comments: now with L anterior CT to water seal (5/18) Restrictions Weight Bearing Restrictions: No    Mobility  Bed Mobility               General bed mobility comments: received up in hallway exiting room with mobility tech present    Transfers Overall transfer level: Needs assistance Equipment used: Rolling walker (2 wheeled) Transfers: Sit to/from Stand Sit to Stand: Min assist         General transfer comment: decreased eccentric control to sit; needs cues for hand placement and controlled lowering  Ambulation/Gait Ambulation/Gait assistance: Min guard;Mod assist Gait Distance (Feet): 300 Feet (397ft with RW (min guard) and 53ft no AD (modA)) Assistive device: Rolling walker (2 wheeled);2 person hand held assist Gait Pattern/deviations: Step-through pattern;Decreased stride  length;Trunk flexed Gait velocity: Decreased Gait velocity interpretation: <1.31 ft/sec, indicative of household ambulator General Gait Details: Pt requesting use of RW for instability and bilateral foot pain; mostly steady gait with RW and min guard for balance; without RW for short distance from bed to chair pt with LOB despite BUE support of staff needing modA to regain balance   Stairs Stairs: Yes Stairs assistance: Min guard;+2 safety/equipment Stair Management: One rail Left;Step to pattern;Forwards Number of Stairs: 4 General stair comments: single 7" step up/down with L rail, no buckling or LOB, cues for activity pacing   Wheelchair Mobility    Modified Rankin (Stroke Patients Only)       Balance Overall balance assessment: Needs assistance Sitting-balance support: No upper extremity supported;Feet supported Sitting balance-Leahy Scale: Fair     Standing balance support: Bilateral upper extremity supported;During functional activity Standing balance-Leahy Scale: Poor Standing balance comment: LOB to R with B HHA stepping to chair; improved balance using RW                            Cognition Arousal/Alertness: Awake/alert Behavior During Therapy: WFL for tasks assessed/performed Overall Cognitive Status: Within Functional Limits for tasks assessed                                 General Comments: WFL for simple tasks; not formally assessed      Exercises      General Comments General comments (skin integrity, edema, etc.): BP 114/74 pre-mobility and BP 109/70 post-exertion (  seated); HR 70's bpm; SpO2 97% on RA (poor pleth signal during gait)      Pertinent Vitals/Pain Pain Assessment: 0-10 Pain Score: 5  Pain Location: Bilateral feet (pt attributes to gout) and CT insertion site Pain Descriptors / Indicators: Grimacing;Burning;Discomfort Pain Intervention(s): Monitored during session;Repositioned    Home Living                       Prior Function            PT Goals (current goals can now be found in the care plan section) Acute Rehab PT Goals Patient Stated Goal: Return home. PT Goal Formulation: With patient Time For Goal Achievement: 10/07/20 Potential to Achieve Goals: Good Progress towards PT goals: Progressing toward goals    Frequency    Min 3X/week      PT Plan Current plan remains appropriate    Co-evaluation              AM-PAC PT "6 Clicks" Mobility   Outcome Measure  Help needed turning from your back to your side while in a flat bed without using bedrails?: None Help needed moving from lying on your back to sitting on the side of a flat bed without using bedrails?: A Little Help needed moving to and from a bed to a chair (including a wheelchair)?: A Little Help needed standing up from a chair using your arms (e.g., wheelchair or bedside chair)?: A Little Help needed to walk in hospital room?: A Little Help needed climbing 3-5 steps with a railing? : A Little 6 Click Score: 19    End of Session Equipment Utilized During Treatment: Gait belt Activity Tolerance: Patient tolerated treatment well Patient left: with call bell/phone within reach;in chair Nurse Communication: Mobility status PT Visit Diagnosis: Unsteadiness on feet (R26.81);Muscle weakness (generalized) (M62.81);Other abnormalities of gait and mobility (R26.89)     Time: 0109-3235 PT Time Calculation (min) (ACUTE ONLY): 20 min  Charges:  $Gait Training: 8-22 mins                     Psalm Schappell P., PTA Acute Rehabilitation Services Pager: [phone] Office: [place] 09/29/2020, 5:33 PM

## 2020-09-29 NOTE — Progress Notes (Signed)
[address]       [employer] [address]             [phone]      2 Days Post-Op Procedure(s) (LRB): PERICARDIAL WINDOW WITH THORACOTOMY APPROACH WITH MYOCARDIAL BIOPSY (Left) TRANSESOPHAGEAL ECHOCARDIOGRAM (TEE) (N/A) Subjective: Feels ok, not SOB, No CP  Objective: Vital signs in last 24 hours: Temp:  [98.7 F (37.1 C)-99.9 F (37.7 C)] 99.9 F (37.7 C) (05/18 0400) Pulse Rate:  [70-80] 78 (05/18 0400) Cardiac Rhythm: Normal sinus rhythm;Heart block (05/17 1900) Resp:  [17-20] 17 (05/18 0400) BP: (94-129)/(63-91) 128/89 (05/18 0400) SpO2:  [95 %-97 %] 96 % (05/18 0400) Weight:  [77.2 kg] 77.2 kg (05/18 0400)  Hemodynamic parameters for last 24 hours:    Intake/Output from previous day: 05/17 0701 - 05/18 0700 In: 240 [P.O.:240] Out: 496 [Urine:200; Chest Tube:296] Intake/Output this shift: No intake/output data recorded.  General appearance: alert, cooperative and no distress Heart: regular rate and rhythm and + rub with CT in place Lungs: clear to auscultation bilaterally Abdomen: benign Extremities: no edema Wound: dressing CDI  Lab Results: Recent Labs    09/28/20 0500 09/29/20 0153  WBC 9.8 8.2  HGB 13.4 13.6  HCT 42.4 44.0  PLT 223 218   BMET:  Recent Labs    09/28/20 0500 09/29/20 0153  NA 136 137  K 5.1 5.0  CL 106 107  CO2 22 23  GLUCOSE 209* 118*  BUN 39* 39*  CREATININE 2.61* 2.55*  CALCIUM 9.5 9.6    PT/INR:  Recent Labs    09/26/20 1447  LABPROT 13.8  INR 1.1   ABG    Component Value Date/Time   PHART 7.341 (L) 09/20/2020 1337   HCO3 22.9 09/20/2020 1355   HCO3 23.5 09/20/2020 1355   TCO2 24 09/20/2020 1355   TCO2 25 09/20/2020 1355   ACIDBASEDEF 3.0 (H) 09/20/2020 1355   ACIDBASEDEF 3.0 (H) 09/20/2020 1355   O2SAT 55.0 09/20/2020 1355   O2SAT 58.0 09/20/2020 1355   CBG (last 3)  Recent Labs    09/28/20 1542 09/28/20 2151 09/29/20 0610  GLUCAP 111* 183* 125*    Meds Scheduled  Meds: . acetaminophen  1,000 mg Oral Q6H   Or  . acetaminophen (TYLENOL) oral liquid 160 mg/5 mL  1,000 mg Oral Q6H  . allopurinol  50 mg Oral q AM  . aspirin EC  81 mg Oral q AM  . bisacodyl  10 mg Oral Daily  . carvedilol  6.25 mg Oral BID WC  . colchicine  0.6 mg Oral Daily  . gabapentin  300 mg Oral QHS  . hydrALAZINE  100 mg Oral TID  . insulin aspart  0-15 Units Subcutaneous TID WC  . isosorbide mononitrate  120 mg Oral Daily  . rosuvastatin  40 mg Oral Daily  . senna-docusate  1 tablet Oral QHS   Continuous Infusions: . sodium chloride     PRN Meds:.sodium chloride, acetaminophen, albuterol, bismuth subsalicylate, morphine injection, ondansetron (ZOFRAN) IV, oxyCODONE, senna-docusate, traMADol  Xrays DG CHEST PORT 1 VIEW  Result Date: 09/28/2020 CLINICAL DATA:  Status post pericardial window EXAM: PORTABLE CHEST 1 VIEW COMPARISON:  09/27/2020 FINDINGS: Lung volumes are small, but are symmetric and are stable since prior examination. Left basilar atelectasis persists. Left basilar chest tube is unchanged. No pneumothorax or pleural effusion. Moderate cardiomegaly persists, stable since prior examination. The central pulmonary arteries are enlarged in keeping with changes of pulmonary arterial hypertension.  No superimposed overt pulmonary edema. No acute bone abnormality. IMPRESSION: Stable examination. Pulmonary hypoinflation. Left chest tube in place without evidence of pneumothorax. Electronically Signed   By: Fidela Salisbury MD   On: 09/28/2020 06:33   DG Chest Port 1 View  Result Date: 09/27/2020 CLINICAL DATA:  Status post pericardial window EXAM: PORTABLE CHEST 1 VIEW COMPARISON:  05/06/2020 FINDINGS: Cardiac shadow is enlarged but slightly decreased when compared with the prior exam. Lungs are hypoinflated. No definitive pneumothorax is seen. Extrinsic skin fold is noted over the left base. Some subcutaneous emphysema is noted related to the recent surgery. No bony abnormality  is seen. IMPRESSION: Poor inspiratory effort with persistent cardiomegaly. Mild subcutaneous emphysema is noted. Electronically Signed   By: Inez Catalina M.D.   On: 09/27/2020 17:57    Assessment/Plan: S/P Procedure(s) (LRB): PERICARDIAL WINDOW WITH THORACOTOMY APPROACH WITH MYOCARDIAL BIOPSY (Left) TRANSESOPHAGEAL ECHOCARDIOGRAM (TEE) (N/A)   1 Tmax 99.9, SBP 90's-120's, sinus with PVC's 2 sats good on RA 3 CT 296 cc yesterday, almost 500 post op. 600 cc removed at time of surgery- keep in place probably one more day 4 creat slightly improved, diuretics and jardiance being held 5 H/H improved, not anemic 6 path pending 7 BS variable 110's -210's, on SSI 8 medical management as per primary   LOS: 9 days    John Giovanni PA-C Pager [phone] 09/29/2020

## 2020-09-29 NOTE — Progress Notes (Signed)
Mobility Specialist: Progress Note   09/29/20 1136  Mobility  Activity Ambulated in hall  Level of Assistance Contact guard assist, steadying assist  Assistive Device Front wheel walker  Distance Ambulated (ft) 340 ft  Mobility Ambulated with assistance in hallway  Mobility Response Tolerated well  Mobility performed by Mobility specialist  Bed Position Chair  $Mobility charge 1 Mobility   Pre-Mobility: 70 HR, 114/74 BP, 92% SpO2 Post-Mobility: 72 HR, 109/70 BP, 97% SpO2  Pt c/o 5/10 pain in LE during ambulation, otherwise asx. Pt ambulated in hallway and performed stair training with PTA Carly during session. Pt walked around the end of the bed without AD after returning to room and experienced LOB, minA to recover. Pt sitting in chair with call bell.   [facility] Corey [name] Mobility Specialist Mobility Specialist Phone: [phone]

## 2020-09-29 NOTE — Progress Notes (Addendum)
Heart Failure Stewardship Pharmacist Progress Note   PCP: [name], MD PCP-Cardiologist: Candee Furbish, MD    HPI:  75 yo male with PMH for chronic pericardial effusion, CHF (EF 60-65% in May 2021), DM, CVA in 2014, HTN, MI, CKD3, and OSA. Presented for scheduled cardiac cath on 09/20/20 which revealed severe single-vessel disease of LAD and marginally reduced CI of 2.09. ECHO on 09/21/20 revealed worsened LVEF 35-40%, RV function moderately reduced. Pericardial effusion enlarged, appearance of myocardium concerning for potential amyloidosis, s/p pericardial window on 09/27/20. Myocardial biopsy pending. Plan for medical management of CAD rather than PCI unless he becomes symptomatic again. Anticipated discharge once chest tubes removed, potentially tomorrow.  Current HF Medications: Jardiance 10 mg daily  Hydralazine 100 mg TID Isosorbide mononitrate 120 mg daily  Prior to admission HF Medications: PO furosemide 20 mg daily  Losartan 100 mg daily Hydralazine 75 mg TID Isosorbide mononitrate 60 mg BID   Pertinent Lab Values: . Serum creatinine 2.61>2.55, BUN 39, Potassium 5.0, Sodium 137, eGFR 26  Vital Signs: . Weight: 170 lbs (admission weight: 185 lbs) . Blood pressure: 110-120s/70s . Heart rate: 70s  Medication Assistance / Insurance Benefits Check: Does the patient have prescription insurance?  Yes Type of insurance plan: [employer]  Does the patient qualify for medication assistance through manufacturers or grants?   Pending . Eligible grants and/or patient assistance programs: Pending . Medication assistance applications in progress: None  . Medication assistance applications approved: None Approved medication assistance renewals will be completed by: Pending  Outpatient Pharmacy:  Prior to admission outpatient pharmacy: [facility] Is the patient willing to use [place] pharmacy at discharge? Yes Is the patient willing to transition their outpatient  pharmacy to utilize a [facility] outpatient pharmacy?   Pending    Assessment: 1. Acute on chronic systolic CHF (EF 24-11%), due to ICM. NYHA class II symptoms. - Agree with holding Entresto and Jardiance for now given worsening renal function and significantly lower BP. Consider restarting once renal function improves. Baseline SCr ~ 1.8 - Continue carvedilol 6.25 mg BID - Continue hydralazine 100 mg TID - Continue isosorbide mononitrate 120 mg daily - Hold on adding spironolactone for now given worsening renal function    Plan: 1) Medication changes recommended at this time: - Continue current medications - Consider restarting Entresto and Jardiance pending BP and improvement in renal function  2) Patient assistance: - Delene Loll copay $45/month - Jardiance copay $45/month  - Wilder Glade copay $95/month  3)  Education  - To be completed prior to discharge  Richardine Service, PharmD, BCPS Kerby Nora, PharmD, BCPS Heart Failure Stewardship Pharmacist Phone [phone]  Please check [email or web address] for unit-specific pharmacist phone numbers

## 2020-09-30 ENCOUNTER — Inpatient Hospital Stay (HOSPITAL_COMMUNITY): Payer: Medicare HMO

## 2020-09-30 ENCOUNTER — Other Ambulatory Visit (HOSPITAL_COMMUNITY): Payer: Self-pay

## 2020-09-30 DIAGNOSIS — I313 Pericardial effusion (noninflammatory): Secondary | ICD-10-CM | POA: Diagnosis not present

## 2020-09-30 DIAGNOSIS — E854 Organ-limited amyloidosis: Secondary | ICD-10-CM

## 2020-09-30 LAB — BASIC METABOLIC PANEL
Anion gap: 6 (ref 5–15)
BUN: 46 mg/dL — ABNORMAL HIGH (ref 8–23)
CO2: 22 mmol/L (ref 22–32)
Calcium: 9.1 mg/dL (ref 8.9–10.3)
Chloride: 109 mmol/L (ref 98–111)
Creatinine, Ser: 2.34 mg/dL — ABNORMAL HIGH (ref 0.61–1.24)
GFR, Estimated: 29 mL/min — ABNORMAL LOW (ref >60)
Glucose, Bld: 143 mg/dL — ABNORMAL HIGH (ref 70–99)
Potassium: 4.7 mmol/L (ref 3.5–5.1)
Sodium: 137 mmol/L (ref 135–145)

## 2020-09-30 LAB — IMMUNOFIXATION ELECTROPHORESIS
IgA: 248 mg/dL (ref 61–437)
IgG (Immunoglobin G), Serum: 1244 mg/dL (ref 603–1613)
IgM (Immunoglobulin M), Srm: 33 mg/dL (ref 15–143)
Total Protein ELP: 7.3 g/dL (ref 6.0–8.5)

## 2020-09-30 LAB — ECHOCARDIOGRAM LIMITED
Calc EF: 33.3 %
Height: 66 in
Single Plane A2C EF: 30.6 %
Single Plane A4C EF: 36.6 %
Weight: 2772.5 oz

## 2020-09-30 LAB — GLUCOSE, CAPILLARY
Glucose-Capillary: 151 mg/dL — ABNORMAL HIGH (ref 70–99)
Glucose-Capillary: 166 mg/dL — ABNORMAL HIGH (ref 70–99)

## 2020-09-30 LAB — IMMUNOFIXATION, URINE

## 2020-09-30 MED ORDER — HYDRALAZINE HCL 50 MG PO TABS
100.0000 mg | ORAL_TABLET | Freq: Three times a day (TID) | ORAL | 6 refills | Status: DC
  Filled 2020-09-30: qty 180, 30d supply, fill #0

## 2020-09-30 MED ORDER — TORSEMIDE 20 MG PO TABS
20.0000 mg | ORAL_TABLET | Freq: Every day | ORAL | Status: DC
  Administered 2020-09-30: 20 mg via ORAL
  Filled 2020-09-30: qty 1

## 2020-09-30 MED ORDER — EMPAGLIFLOZIN 10 MG PO TABS
10.0000 mg | ORAL_TABLET | Freq: Every day | ORAL | 6 refills | Status: DC
  Filled 2020-09-30: qty 14, 14d supply, fill #0

## 2020-09-30 MED ORDER — ISOSORBIDE MONONITRATE ER 60 MG PO TB24
120.0000 mg | ORAL_TABLET | Freq: Every day | ORAL | 3 refills | Status: DC
  Filled 2020-09-30: qty 180, 90d supply, fill #0

## 2020-09-30 MED ORDER — ROSUVASTATIN CALCIUM 40 MG PO TABS
40.0000 mg | ORAL_TABLET | Freq: Every day | ORAL | 3 refills | Status: DC
  Filled 2020-09-30: qty 90, 90d supply, fill #0

## 2020-09-30 MED ORDER — TORSEMIDE 20 MG PO TABS
20.0000 mg | ORAL_TABLET | Freq: Every day | ORAL | 3 refills | Status: DC
  Filled 2020-09-30: qty 90, 90d supply, fill #0

## 2020-09-30 MED ORDER — NITROGLYCERIN 0.4 MG SL SUBL
0.4000 mg | SUBLINGUAL_TABLET | SUBLINGUAL | 3 refills | Status: DC | PRN
  Filled 2020-09-30: qty 25, 8d supply, fill #0

## 2020-09-30 MED ORDER — CARVEDILOL 6.25 MG PO TABS
6.2500 mg | ORAL_TABLET | Freq: Two times a day (BID) | ORAL | 6 refills | Status: DC
  Filled 2020-09-30: qty 60, 30d supply, fill #0

## 2020-09-30 NOTE — Plan of Care (Signed)
  Problem: Education: Goal: Knowledge of General Education information will improve Description: Including pain rating scale, medication(s)/side effects and non-pharmacologic comfort measures Outcome: Adequate for Discharge   

## 2020-09-30 NOTE — Discharge Summary (Signed)
Discharge Summary    Patient ID: Corey [name] MRN: [id number]; DOB: Jan 07, 1946  Admit date: 09/20/2020 Discharge date: 09/30/2020  PCP:  [name], MD   [facility] Providers Cardiologist:  Candee Furbish, MD        Discharge Diagnoses    Principal Problem:   Progressive angina ([employer]) - Atypical; DOE Active Problems:   Type 1 diabetes mellitus with complications ([place])   Stage 3b chronic kidney disease   OSA (obstructive sleep apnea)   DOE (dyspnea on exertion)   Coronary artery disease involving native coronary artery of native heart with angina pectoris ([place])   Acute on chronic combined systolic and diastolic CHF (congestive heart failure) ([place])   Acute combined systolic and diastolic heart failure (HCC)   S/P pericardial window creation   Cardiac amyloidosis ([place])    Diagnostic Studies/Procedures    Right/left heart catheterization 09/20/20:  Hemodynamic findings consistent with moderate pulmonary hypertension.  LV end diastolic pressure is severely elevated.  There is no aortic valve stenosis.  Mid LAD to Dist LAD lesion is 80% stenosed.  Ramus lesion is 20% stenosed.   SUMMARY  Severe single-vessel disease with heavily calcified mid LAD 80% stenosis  Acute Combined Systolic and Diastolic Heart Failure (systolic function appears to be reduced, to conserve contrast, LV gram not performed), but LVEDP 32 mmHg and PCWP 28 mmHg.  Systemic Hypertension with Mean Arterial Pressure 123 mmHg  Secondary Pulmonary Hypertension: PAP 67/19 mmHg-mean 39 mmHg with RVP-EDP of 67/2 mmHg-15 mmHg, RVP and RAP of 14 mmHg.  Moderate to severely reduced Cardiac Output-Index: 4.04-2.09   RECOMMENDATIONS  With significant CHF, orthopnea and PND with LVEDP of 32 mmHg, patient is not stable for LAD PCI which would likely require atherectomy. ? We will admit to cardiology service for titration of cardiac meds and diuresis: Converting losartan to [facility], otherwise continue  home medications. ? IV Lasix 40 mg twice daily ? Check 2D echo ? Reassess volume status in the next 1 to 2 days to determine feasibility for staged LAD PCI (via femoral access due to extreme tortuosity of the innominate artery.)   Slight scale insulin; consider adding Farxiga or Jardiance    Echocardiogram 09/21/20: 1. There is no left ventricular thrombus with Definity contrast. Left  ventricular ejection fraction, by estimation, is 35 to 40%. The left  ventricle has moderately decreased function. The left ventricle  demonstrates regional wall motion abnormalities  (see scoring diagram/findings for description). There is global left  ventricular hypokinesis, disproportionately severe in the mid-apical  anterior and anterolateral walls and apex. The anterior septum has  relatively preserved contractility. There is  severe concentric left ventricular hypertrophy. Left ventricular diastolic  parameters are consistent with Grade II diastolic dysfunction  (pseudonormalization). Elevated left atrial pressure.  2. Right ventricular systolic function is moderately reduced. The right  ventricular size is normal. Mildly increased right ventricular wall  thickness. There is moderately elevated pulmonary artery systolic  pressure.  3. Left atrial size was severely dilated.  4. Right atrial size was mildly dilated.  5. Large pericardial effusion. The pericardial effusion is  circumferential. There is no evidence of cardiac tamponade.  6. The mitral valve is normal in structure. Mild mitral valve  regurgitation.  7. The aortic valve is tricuspid. Aortic valve regurgitation is not  visualized. No aortic stenosis is present.  8. The inferior vena cava is dilated in size with <50% respiratory  variability, suggesting right atrial pressure of 15 mmHg.   Limited echo  09/30/20: 1. Trace pericardial fluid posterior and around right AV groove. Compared  to prior study 09/21/20, pericardial  effusion has essentially resolved.  2. Left ventricular ejection fraction, by estimation, is 40%. The left  ventricle has moderately decreased function. The left ventricle  demonstrates global hypokinesis. There is severe left ventricular  hypertrophy.  3. Right ventricular systolic function is moderately reduced. The right  ventricular size is normal. Severely increased right ventricular wall  thickness.  4. Trivial mitral valve regurgitation.  5. The inferior vena cava is dilated in size with <50% respiratory  variability, suggesting right atrial pressure of 15 mmHg.  _____________   History of Present Illness     Corey [name] is a 75 y.o. male with a PMH of a chronic pericardial effusion, PEA arrest, HTN, HLD, DM type 2, CVA, OSA, and prostate cancer, who was seen outpatient by Dr. Marlou Porch 09/15/20 with complaints of progressive DOE. Weights were reported to be stable and he ws without exertional chest pain, tightness, or pressure. He denied lightheadedness, syncope, orthopnea, or PND. There was concern that his symptoms were an anginal equivalent. Decision made to pursue a R/LHC to evaluate coronary anatomy and assess cardiac output. He presented 09/20/20 for his procedure.   Hospital Course     Consultants: TCTS   1. Progressive angina: patient presented for outpatient [facility] 09/20/20 to evaluate progressive DOE c/f anginal equivalent. Cardiac cath showed 80% m-dLAD stenosis and 20% ramus lesion, otherwise normal coronaries. LVEDP was severely elevated and he had moderate pulmonary HTN, as well as moderate to severely reduced cardiac output. His LAD lesion was heavily calcified and could be considered for atherectomy if refractory symptoms.  - Continue aspirin and statin - Continue BBlocker  2. Chronic combined CHF/Cardiac amyloidosis: Echo this admission showed EF 35-40%, G3DD, RWMA, severe concentric LVH, moderately reduced RV systolic function, moderately elevated PA pressures, severe  LAE, mild RAE, large circumferential pericardial effusion without tamponade. He underwent a pericardial window and biopsy for management of his pericardial effusion. 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 It was felt that patient could forgo PYP scan. SPEP/UPEP pending. Treatment plan pending these results but likely oncology follow-up. He was started on entresto but unfortunately Cr bumped. He was ultimately transitioned to hydralazine, imdur, and carvedilol for GDMT. He was diuresed with IV lasix and transitioned to torsemide 20mg  daily. Weight on the day of discharge was 173lbs, down from 184lbs on admission. Jardiance 10mg  daily started at discharge.  - Follow-up arranged in [facility]. Patient to see Dr. Audie Box in 4-6 weeks   - Continue carvedilol, hydralazine, imdur, and torsemide - Continue jardiance - would repeat BMET at follow-up for close monitoring of kidney function - Follow-up amyloidosis work-up   3. Pericardial effusion s/p pericardial window: patient was found to have a large pericardial effusion on echo. TCTS consulted and patient underwent a pericardial window with myocardial biopsy on 09/27/20. He had a chest tube placed during the procedure which was removed 2/77/82 without complication. Biopsy confirmed amyloidosis. Limited echo 09/30/20 showed essentially resolved pericardial effusion - Follow-up with TCTS outpatient as scheduled.   4. AoCKD stage 3: Cr peaked at 2.61 this admission from baseline 1.6. Cr improved to 2.34 on the day of discharge - Continue monitor closely outpatient - would repeat BMET at follow-up.   5. Bradycardia with first-degree AV block: noted on telemetry this admission. Tolerating coreg without issues - Continue carvedilol    Did the patient have an  acute coronary syndrome (MI, NSTEMI, STEMI, etc) this admission?:  No                               Did the patient have a percutaneous coronary  intervention (stent / angioplasty)?:  No.       _____________  Discharge Vitals Blood pressure 124/72, pulse 70, temperature 98.6 F (37 C), temperature source Oral, resp. rate 20, height 5\' 6"  (1.676 m), weight 78.6 kg, SpO2 99 %.  Filed Weights   09/27/20 1126 09/29/20 0400 09/30/20 0311  Weight: 76.3 kg 77.2 kg 78.6 kg    Labs & Radiologic Studies    CBC Recent Labs    09/28/20 0500 09/29/20 0153  WBC 9.8 8.2  HGB 13.4 13.6  HCT 42.4 44.0  MCV 86.0 87.6  PLT 223 509   Basic Metabolic Panel Recent Labs    09/29/20 0153 09/30/20 0119  NA 137 137  K 5.0 4.7  CL 107 109  CO2 23 22  GLUCOSE 118* 143*  BUN 39* 46*  CREATININE 2.55* 2.34*  CALCIUM 9.6 9.1   Liver Function Tests No results for input(s): AST, ALT, ALKPHOS, BILITOT, PROT, ALBUMIN in the last 72 hours. No results for input(s): LIPASE, AMYLASE in the last 72 hours. High Sensitivity Troponin:   No results for input(s): TROPONINIHS in the last 720 hours.  BNP Invalid input(s): POCBNP D-Dimer No results for input(s): DDIMER in the last 72 hours. Hemoglobin A1C No results for input(s): HGBA1C in the last 72 hours. Fasting Lipid Panel No results for input(s): CHOL, HDL, LDLCALC, TRIG, CHOLHDL, LDLDIRECT in the last 72 hours. Thyroid Function Tests No results for input(s): TSH, T4TOTAL, T3FREE, THYROIDAB in the last 72 hours.  Invalid input(s): FREET3 _____________  CARDIAC CATHETERIZATION  Result Date: 09/20/2020  Hemodynamic findings consistent with moderate pulmonary hypertension.  LV end diastolic pressure is severely elevated.  There is no aortic valve stenosis.  Mid LAD to Dist LAD lesion is 80% stenosed.  Ramus lesion is 20% stenosed.  SUMMARY  Severe single-vessel disease with heavily calcified mid LAD 80% stenosis  Acute Combined Systolic and Diastolic Heart Failure (systolic function appears to be reduced, to conserve contrast, LV gram not performed), but LVEDP 32 mmHg and PCWP 28 mmHg.   Systemic Hypertension with Mean Arterial Pressure 123 mmHg  Secondary Pulmonary Hypertension: PAP 67/19 mmHg-mean 39 mmHg with RVP-EDP of 67/2 mmHg-15 mmHg, RVP and RAP of 14 mmHg.  Moderate to severely reduced Cardiac Output-Index: 4.04-2.09 RECOMMENDATIONS  With significant CHF, orthopnea and PND with LVEDP of 32 mmHg, patient is not stable for LAD PCI which would likely require atherectomy.  We will admit to cardiology service for titration of cardiac meds and diuresis: Converting losartan to [facility], otherwise continue home medications.  IV Lasix 40 mg twice daily  Check 2D echo  Reassess volume status in the next 1 to 2 days to determine feasibility for staged LAD PCI (via femoral access due to extreme tortuosity of the innominate artery.)  Slight scale insulin; consider adding Farxiga or Earnest Rosier, MD  DG CHEST PORT 1 VIEW  Result Date: 09/28/2020 CLINICAL DATA:  Status post pericardial window EXAM: PORTABLE CHEST 1 VIEW COMPARISON:  09/27/2020 FINDINGS: Lung volumes are small, but are symmetric and are stable since prior examination. Left basilar atelectasis persists. Left basilar chest tube is unchanged. No pneumothorax or pleural effusion. Moderate cardiomegaly persists, stable since prior examination. The central pulmonary  arteries are enlarged in keeping with changes of pulmonary arterial hypertension. No superimposed overt pulmonary edema. No acute bone abnormality. IMPRESSION: Stable examination. Pulmonary hypoinflation. Left chest tube in place without evidence of pneumothorax. Electronically Signed   By: Fidela Salisbury MD   On: 09/28/2020 06:33   DG Chest Port 1 View  Result Date: 09/27/2020 CLINICAL DATA:  Status post pericardial window EXAM: PORTABLE CHEST 1 VIEW COMPARISON:  05/06/2020 FINDINGS: Cardiac shadow is enlarged but slightly decreased when compared with the prior exam. Lungs are hypoinflated. No definitive pneumothorax is seen. Extrinsic skin fold is noted  over the left base. Some subcutaneous emphysema is noted related to the recent surgery. No bony abnormality is seen. IMPRESSION: Poor inspiratory effort with persistent cardiomegaly. Mild subcutaneous emphysema is noted. Electronically Signed   By: Inez Catalina M.D.   On: 09/27/2020 17:57   ECHOCARDIOGRAM COMPLETE  Result Date: 09/21/2020    ECHOCARDIOGRAM REPORT   Patient Name:   Corey [name] Date of Exam: 09/21/2020 Medical Rec #:  [id number]    Height:       66.0 in Accession #:    [id number]   Weight:       180.3 lb Date of Birth:  [birth year]    BSA:          1.914 m Patient Age:    84 years     BP:           152/83 mmHg Patient Gender: M            HR:           61 bpm. Exam Location:  Inpatient Procedure: 2D Echo, Cardiac Doppler and Color Doppler Indications:    CAD Native Vessel  History:        Patient has prior history of Echocardiogram examinations, most                 recent 10/10/2019. Cardiomyopathy and Hypertrophic                 Cardiomyopathy, CAD, Stroke; Risk Factors:Former Smoker.  Sonographer:    Cammy Brochure Referring Phys: [place]  1. There is no left ventricular thrombus with Definity contrast. Left ventricular ejection fraction, by estimation, is 35 to 40%. The left ventricle has moderately decreased function. The left ventricle demonstrates regional wall motion abnormalities (see scoring diagram/findings for description). There is global left ventricular hypokinesis, disproportionately severe in the mid-apical anterior and anterolateral walls and apex. The anterior septum has relatively preserved contractility. There is severe concentric left ventricular hypertrophy. Left ventricular diastolic parameters are consistent with Grade II diastolic dysfunction (pseudonormalization). Elevated left atrial pressure.  2. Right ventricular systolic function is moderately reduced. The right ventricular size is normal. Mildly increased right ventricular wall thickness.  There is moderately elevated pulmonary artery systolic pressure.  3. Left atrial size was severely dilated.  4. Right atrial size was mildly dilated.  5. Large pericardial effusion. The pericardial effusion is circumferential. There is no evidence of cardiac tamponade.  6. The mitral valve is normal in structure. Mild mitral valve regurgitation.  7. The aortic valve is tricuspid. Aortic valve regurgitation is not visualized. No aortic stenosis is present.  8. The inferior vena cava is dilated in size with <50% respiratory variability, suggesting right atrial pressure of 15 mmHg. Comparison(s): The left ventricular function is significantly worse. The left ventricular wall motion abnormality is worse. The pericardial effusion is slightly larger. There are no  overt findings to support tamponade, other than inferior vena cava dilation. However, the sensitivity of echo for the diagnosis of pericardial tamponade is diminished in the setting of pulmonary artery hypertension. FINDINGS  Left Ventricle: There is no left ventricular thrombus with Definity contrast. Left ventricular ejection fraction, by estimation, is 35 to 40%. The left ventricle has moderately decreased function. The left ventricle demonstrates regional wall motion abnormalities. The left ventricular internal cavity size was normal in size. There is severe concentric left ventricular hypertrophy. Left ventricular diastolic parameters are consistent with Grade II diastolic dysfunction (pseudonormalization). Elevated  left atrial pressure.  LV Wall Scoring: The mid and distal anterior wall, apical lateral segment, mid anterolateral segment, and apex are hypokinetic. There is global left ventricular hypokinesis, disproportionately severe in the mid-apical anterior and anterolateral walls and apex. The anterior septum has relatively preserved contractility. Right Ventricle: The right ventricular size is normal. Mildly increased right ventricular wall thickness.  Right ventricular systolic function is moderately reduced. There is moderately elevated pulmonary artery systolic pressure. The tricuspid regurgitant velocity is 2.75 m/s, and with an assumed right atrial pressure of 15 mmHg, the estimated right ventricular systolic pressure is 71.6 mmHg. Left Atrium: Left atrial size was severely dilated. Right Atrium: Right atrial size was mildly dilated. Pericardium: A large pericardial effusion is present. The pericardial effusion is circumferential. There is no evidence of cardiac tamponade. Mitral Valve: The mitral valve is normal in structure. Mild mitral valve regurgitation. Tricuspid Valve: The tricuspid valve is normal in structure. Tricuspid valve regurgitation is trivial. Aortic Valve: The aortic valve is tricuspid. Aortic valve regurgitation is not visualized. No aortic stenosis is present. Aortic valve mean gradient measures 4.0 mmHg. Aortic valve peak gradient measures 6.6 mmHg. Aortic valve area, by VTI measures 2.82 cm. Pulmonic Valve: The pulmonic valve was normal in structure. Pulmonic valve regurgitation is mild. Aorta: The aortic root and ascending aorta are structurally normal, with no evidence of dilitation. Venous: The inferior vena cava is dilated in size with less than 50% respiratory variability, suggesting right atrial pressure of 15 mmHg. IAS/Shunts: No atrial level shunt detected by color flow Doppler.  LEFT VENTRICLE PLAX 2D LVIDd:         4.90 cm  Diastology LVIDs:         4.00 cm  LV e' medial:    3.81 cm/s LV PW:         2.10 cm  LV E/e' medial:  33.1 LV IVS:        2.20 cm  LV e' lateral:   4.79 cm/s LVOT diam:     2.10 cm  LV E/e' lateral: 26.3 LV SV:         62 LV SV Index:   32 LVOT Area:     3.46 cm  RIGHT VENTRICLE RV Basal diam:  3.20 cm RV S prime:     8.27 cm/s LEFT ATRIUM              Index       RIGHT ATRIUM           Index LA diam:        4.60 cm  2.40 cm/m  RA Area:     20.80 cm LA Vol (A2C):   101.0 ml 52.78 ml/m RA Volume:    62.00 ml  32.40 ml/m LA Vol (A4C):   83.5 ml  43.63 ml/m LA Biplane Vol: 98.1 ml  51.26 ml/m  AORTIC VALVE AV Area (Vmax):  2.90 cm AV Area (Vmean):   2.62 cm AV Area (VTI):     2.82 cm AV Vmax:           128.00 cm/s AV Vmean:          94.300 cm/s AV VTI:            0.219 m AV Peak Grad:      6.6 mmHg AV Mean Grad:      4.0 mmHg LVOT Vmax:         107.00 cm/s LVOT Vmean:        71.300 cm/s LVOT VTI:          0.178 m LVOT/AV VTI ratio: 0.81  AORTA Ao Root diam: 3.60 cm Ao Asc diam:  3.70 cm MITRAL VALVE                TRICUSPID VALVE MV Area (PHT): 3.99 cm     TR Peak grad:   30.3 mmHg MV Decel Time: 190 msec     TR Vmax:        275.09 cm/s MV E velocity: 126.00 cm/s MV A velocity: 36.50 cm/s   SHUNTS MV E/A ratio:  3.45         Systemic VTI:  0.18 m                             Systemic Diam: 2.10 cm Dani Gobble Croitoru MD Electronically signed by Sanda Klein MD Signature Date/Time: 09/21/2020/11:24:42 AM    Final    Disposition   Pt is being discharged home today in good condition.  Follow-up Plans & Appointments     Follow-up Information     [facility]. Go to.   Specialty: Cardiology Why: Friday, May 27 @ 10AM. [facility]. FREE valet parking at [employer], off [address] all medications and pill box to your ~1 hour appt; you will see a HF provider, HF pharmacist, and nurse.  Contact information: [address] [id number] [place]       Wonda Olds, MD Follow up.   Specialty: Cardiothoracic Surgery Why: Please see discharge paperwork for follow-up appointment with surgeon.  Please also obtain a chest x-ray at [place] 1/2-hour prior to your appointment with the surgeon.  It is located in the same office complex on the first floor. Contact information: [place] [address] [phone]         [facility] Follow up.   Specialty: Rehabilitation Why: outpt PT referral made Contact information: [address] [phone] [place]             Discharge Instructions    Change dressing (specify)   Complete by: As directed    See DISCHARGE INSTRUCTIONS for information on wound care.   Diet - low sodium heart healthy   Complete by: As directed    Increase activity slowly   Complete by: As directed       Discharge Medications   Allergies as of 09/30/2020      Reactions   Hydrochlorothiazide Other (See Comments)   Gout , uncontrolled diabetes and renal insufficiency      Medication List    STOP taking these medications   amLODipine 10 MG tablet Commonly known as: NORVASC   cloNIDine 0.3  MG tablet Commonly known as: CATAPRES   furosemide 20 MG tablet Commonly known as: LASIX   glimepiride 1 MG tablet Commonly known as: AMARYL   losartan 100 MG tablet Commonly known as: COZAAR   naproxen sodium 220 MG tablet Commonly known as: ALEVE     TAKE these medications   albuterol 108 (90 Base) MCG/ACT inhaler Commonly known as: VENTOLIN HFA Inhale 2 puffs into the lungs every 4 (four) hours as needed for wheezing or shortness of breath.   allopurinol 100 MG tablet Commonly known as: ZYLOPRIM Take 0.5 tablets (50 mg total) by mouth daily. What changed: when to take this   aspirin EC 81 MG tablet Take 1 tablet (81 mg total) by mouth daily. What changed: when to take this   bismuth subsalicylate 697 XY/80XK suspension Commonly known as: PEPTO BISMOL Take 30 mLs by mouth every 6 (six) hours as needed for indigestion.   carvedilol 6.25 MG tablet Commonly known as: COREG Take 1 tablet (6.25 mg total) by mouth 2 (two) times daily with a meal.   colchicine 0.6 MG tablet Commonly known as: Colcrys Take 2 tabs once and then one tab one hour later as needed for gout What changed:    how much to take  how to take this  when to take this   empagliflozin 10 MG Tabs tablet Commonly known as: Jardiance Take 1 tablet (10 mg total) by mouth daily before breakfast.   gabapentin 300 MG capsule Commonly known as: Neurontin Take 1 capsule (300 mg total) by mouth at bedtime.   hydrALAZINE 50 MG tablet Commonly known as: APRESOLINE Take 2 tablets (100 mg total) by mouth 3 (three) times daily. What changed: See the new instructions.   isosorbide mononitrate 60 MG 24 hr tablet Commonly known as: IMDUR Take 2 tablets (120 mg total) by mouth daily. Start taking on: Oct 01, 2020 What changed: See the new instructions.   metFORMIN 500 MG tablet Commonly known as: GLUCOPHAGE Take 1 tablet (500 mg total) by mouth 2 (two) times daily with a meal. What changed: when to take this   nitroGLYCERIN 0.4 MG SL tablet Commonly known as: Nitrostat Place 1 tablet (0.4 mg total) under the tongue every 5 (five) minutes as needed for chest pain.   rosuvastatin 40 MG tablet Commonly known as: CRESTOR Take 1 tablet (40 mg total) by mouth daily. Start taking on: Oct 01, 2020   torsemide 20 MG tablet Commonly known as: DEMADEX Take 1 tablet (20 mg total) by mouth daily. Start taking on: Oct 01, 2020            Discharge Care Instructions  (From admission, onward)         Start     Ordered   09/30/20 0000  Change dressing (specify)       Comments: See DISCHARGE INSTRUCTIONS for information on wound care.   09/30/20 1219             Outstanding Labs/Studies   Would check BMET at follow-up for close monitoring of kidney function/electrolytes  Duration of Discharge Encounter   Greater than 30 minutes including physician time.  Signed, Abigail Butts, PA-C 09/30/2020, 1:20 PM

## 2020-09-30 NOTE — Progress Notes (Signed)
75 cc output from chest tube.  Will come out today.  Cardiac biopsy positive for cardiac amyloidosis.  It appears to be [place] red positive which was be suggestive of AL amyloidosis.  He still has serum and urine immunofixation pending.  Also serum light chains are pending.  I think he does not need a PYP scan at this point.  This is more indicative of AL amyloidosis.  Full discharge summary to follow.   [place] T. Audie Box, MD, [place]  [address] [place],  [address] [phone]  8:28 AM ]

## 2020-09-30 NOTE — Discharge Instructions (Signed)
Heart Failure Education: 1. Weigh yourself EVERY morning after you go to the bathroom but before you eat or drink anything. Write this number down in a weight log/diary. If you gain 3 pounds overnight or 5 pounds in a week, call the office. 2. Take your medicines as prescribed. If you have concerns about your medications, please call us before you stop taking them.  3. Eat low salt foods--Limit salt (sodium) to 2000 mg per day. This will help prevent your body from holding onto fluid. Read food labels as many processed foods have a lot of sodium, especially canned goods and prepackaged meats. If you would like some assistance choosing low sodium foods, we would be happy to set you up with a nutritionist. 4. Stay as active as you can everyday. Staying active will give you more energy and make your muscles stronger. Start with 5 minutes at a time and work your way up to 30 minutes a day. Break up your activities--do some in the morning and some in the afternoon. Start with 3 days per week and work your way up to 5 days as you can.  If you have chest pain, feel short of breath, dizzy, or lightheaded, STOP. If you don't feel better after a short rest, call 911. If you do feel better, call the office to let us know you have symptoms with exercise. 5. Limit all fluids for the day to less than 2 liters. Fluid includes all drinks, coffee, juice, ice chips, soup, jello, and all other liquids.   There have been several changes to your medications this admission. Please pay close attention to your medication list at discharge to ensure you are taking your medications as prescribed.       [facility] office [phone]   Thoracoscopy, Care After This sheet gives you information about how to care for yourself after your procedure. Your health care provider may also give you more specific instructions. If you have problems or questions, contact your health care provider. What can I expect after the procedure? After  the procedure, it is common to have pain and soreness in the surgical area. Follow these instructions at home: Incision care  Follow instructions from your health care provider about how to take care of your incision. Make sure you: ? Wash your hands with soap and water before you change your bandage (dressing). If soap and water are not available, use hand sanitizer. ? Change your dressing as told by your health care provider. ? Leave stitches (sutures), skin glue, or adhesive strips in place. These skin closures may need to stay in place for 2 weeks or longer. If adhesive strip edges start to loosen and curl up, you may trim the loose edges. Do not remove adhesive strips completely unless your health care provider tells you to do that.  Check your incision areas every day for signs of infection. Check for: ? Redness, swelling, or pain. ? Fluid or blood. ? Warmth. ? Pus or a bad smell.  Do not take baths, swim, or use a hot tub until your health care provider approves. You may take showers.   Medicines  Take over-the-counter and prescription medicines only as told by your health care provider.  If you were prescribed an antibiotic medicine, take it as told by your health care provider. Do not stop taking the antibiotic even if you start to feel better.  Do not drive or use heavy machinery while taking prescription pain medicine.  If you are  taking prescription pain medicine, take actions to prevent or treat constipation. Your health care provider may recommend that you: ? Drink enough fluid to keep your urine pale yellow. ? Eat foods that are high in fiber, such as fresh fruits and vegetables, whole grains, and beans. ? Limit foods that are high in fat and processed sugars, such as fried and sweet foods. ? Take an over-the-counter or prescription medicine for constipation. Managing pain, stiffness, and swelling  If directed, put ice on the affected area: ? Put ice in a plastic  bag. ? Place a towel between your skin and the bag. ? Leave the ice on for 20 minutes, 2-3 times a day.   Preventing lung infection  To prevent pneumonia and to keep your lungs healthy: ? Try to cough often. If it hurts to cough, hold a pillow against your chest as you cough. ? Take deep breaths or do breathing exercises as instructed by your health care provider. ? If you were given an incentive spirometer, use it as directed by your health care provider. General instructions  Do not lift anything that is heavier than 10 lb (4.5 kg), or the limit that you are told, until your health care provider says that it is safe.  Do not use any products that contain nicotine or tobacco, such as cigarettes and e-cigarettes. These can delay healing after surgery. If you need help quitting, ask your health care provider.  Avoid driving until your health care provider approves.  If you have a chest drainage tube, care for it as instructed by your health care provider. Do not travel by airplane after the chest drainage tube is removed until your health care provider approves.  Keep all follow-up visits as told by your health care provider. This is important. Contact a health care provider if:  You have a fever.  Pain medicines do not ease your pain.  You have redness, swelling, or increasing pain in your incision area.  You develop a cough that does not go away, or you are coughing up mucus that is yellow or green. Get help right away if:  You have fluid, blood, or pus coming from your incision.  There is a bad smell coming from your incision or dressing.  You develop a rash.  You cough up blood.  You develop light-headedness, or you feel faint.  You have difficulty breathing.  You develop chest pain.  Your heartbeat feels irregular or very fast. These symptoms may represent a serious problem that is an emergency. Do not wait to see if the symptoms will go away. Get medical help right  away. Call your local emergency services (911 in the [place] Do not drive yourself to the hospital. Summary  Follow instructions from your health care provider about how to take care of your incision.  Do not drive or use heavy machinery while taking prescription pain medicine.  Leave stitches (sutures), skin glue, or adhesive strips in place.  Check your incision areas every day for signs of infection. This information is not intended to replace advice given to you by your health care provider. Make sure you discuss any questions you have with your health care provider. Document Revised: 01/01/2020 Document Reviewed: 01/01/2020 Elsevier Patient Education  2021 [employer].

## 2020-09-30 NOTE — Progress Notes (Addendum)
Mobility Specialist: Progress Note   09/30/20 1249  Mobility  Activity Ambulated in hall  Level of Assistance Standby assist, set-up cues, supervision of patient - no hands on  Assistive Device Front wheel walker  Distance Ambulated (ft) 470 ft  Mobility Ambulated with assistance in hallway  Mobility Response Tolerated well  Mobility performed by Mobility specialist  Bed Position Chair  $Mobility charge 1 Mobility   Pre-Mobility: 68 HR, 108/71 BP Post-Mobility: 74 HR, 108/71 BP  Pt asx during ambulation. Pt did require VC for RW placement during ambulation. Pt is hopeful for discharge later today.   [facility] Euel Castile Mobility Specialist Mobility Specialist Phone: [phone]

## 2020-09-30 NOTE — Progress Notes (Signed)
  Echocardiogram 2D Echocardiogram has been performed.  Corey [name] 09/30/2020, 9:33 AM

## 2020-09-30 NOTE — Progress Notes (Addendum)
[address]       [employer] [address]             [phone]      3 Days Post-Op Procedure(s) (LRB): PERICARDIAL WINDOW WITH THORACOTOMY APPROACH WITH MYOCARDIAL BIOPSY (Left) TRANSESOPHAGEAL ECHOCARDIOGRAM (TEE) (N/A) Subjective: Minor discomforts from tube  Objective: Vital signs in last 24 hours: Temp:  [98 F (36.7 C)-99.8 F (37.7 C)] 98 F (36.7 C) (05/19 0311) Pulse Rate:  [66-78] 66 (05/19 0311) Cardiac Rhythm: Normal sinus rhythm;Heart block (05/18 1900) Resp:  [18-20] 18 (05/19 0311) BP: (101-150)/(64-96) 150/96 (05/19 0311) SpO2:  [93 %-95 %] 95 % (05/19 0311) Weight:  [78.6 kg] 78.6 kg (05/19 0311)  Hemodynamic parameters for last 24 hours:    Intake/Output from previous day: 05/18 0701 - 05/19 0700 In: -  Out: 645 [Urine:575; Chest Tube:70] Intake/Output this shift: No intake/output data recorded.  General appearance: alert, cooperative and no distress Heart: irregularly irregular rhythm Lungs: min dim in bases Wound: healing well  Lab Results: Recent Labs    09/28/20 0500 09/29/20 0153  WBC 9.8 8.2  HGB 13.4 13.6  HCT 42.4 44.0  PLT 223 218   BMET:  Recent Labs    09/29/20 0153 09/30/20 0119  NA 137 137  K 5.0 4.7  CL 107 109  CO2 23 22  GLUCOSE 118* 143*  BUN 39* 46*  CREATININE 2.55* 2.34*  CALCIUM 9.6 9.1    PT/INR: No results for input(s): LABPROT, INR in the last 72 hours. ABG    Component Value Date/Time   PHART 7.341 (L) 09/20/2020 1337   HCO3 22.9 09/20/2020 1355   HCO3 23.5 09/20/2020 1355   TCO2 24 09/20/2020 1355   TCO2 25 09/20/2020 1355   ACIDBASEDEF 3.0 (H) 09/20/2020 1355   ACIDBASEDEF 3.0 (H) 09/20/2020 1355   O2SAT 55.0 09/20/2020 1355   O2SAT 58.0 09/20/2020 1355   CBG (last 3)  Recent Labs    09/29/20 1539 09/29/20 2016 09/30/20 0617  GLUCAP 132* 212* 151*    Meds Scheduled Meds: . acetaminophen  1,000 mg Oral Q6H   Or  . acetaminophen (TYLENOL) oral liquid 160 mg/5 mL   1,000 mg Oral Q6H  . allopurinol  50 mg Oral q AM  . aspirin EC  81 mg Oral q AM  . bisacodyl  10 mg Oral Daily  . carvedilol  6.25 mg Oral BID WC  . colchicine  0.6 mg Oral Daily  . gabapentin  300 mg Oral QHS  . heparin injection (subcutaneous)  5,000 Units Subcutaneous Q8H  . hydrALAZINE  100 mg Oral TID  . insulin aspart  0-15 Units Subcutaneous TID WC  . isosorbide mononitrate  120 mg Oral Daily  . rosuvastatin  40 mg Oral Daily  . senna-docusate  1 tablet Oral QHS   Continuous Infusions: . sodium chloride     PRN Meds:.sodium chloride, acetaminophen, albuterol, bismuth subsalicylate, morphine injection, ondansetron (ZOFRAN) IV, oxyCODONE, senna-docusate, traMADol  Xrays No results found.  Assessment/Plan: S/P Procedure(s) (LRB): PERICARDIAL WINDOW WITH THORACOTOMY APPROACH WITH MYOCARDIAL BIOPSY (Left) TRANSESOPHAGEAL ECHOCARDIOGRAM (TEE) (N/A)  1 Tmax 99.8, s BP 100's-150's 2 sats good on RA 3 path is + for amyloid 4 CT drainage only 70, with window it is safe to remove tube 5 creat trending towards baseline 6 discharge per cardiology, f/u arranged    LOS: 10 days    John Giovanni PA-C Pager [phone] 09/30/2020 Pt seen and examined;  will remove chest tube today and f/u cxr. Keosha Rossa Z. Orvan Seen, [place]

## 2020-09-30 NOTE — Progress Notes (Signed)
Discharge instructions (including medications) discussed with and copy provided to patient/caregiver 

## 2020-09-30 NOTE — Op Note (Signed)
Procedure(s): PERICARDIAL WINDOW WITH THORACOTOMY APPROACH WITH MYOCARDIAL BIOPSY TRANSESOPHAGEAL ECHOCARDIOGRAM (TEE) Procedure Note  Corey [name] male 75 y.o. 09/27/20 Procedure(s) and Anesthesia Type:    * PERICARDIAL WINDOW WITH THORACOTOMY APPROACH WITH MYOCARDIAL BIOPSY - General    * TRANSESOPHAGEAL ECHOCARDIOGRAM (TEE) - General Multilevel rib block with Exparel solution Surgeon(s) and Role:    * Wonda Olds, MD - Primary   Indications: The patient was admitted to the hospital with heart failure symptoms.  Evaluation has demonstrated a large pericardial effusion.  There is also suspicion for amyloid disease of the heart.  Cardiothoracic surgery was consulted for pericardial window and myocardial biopsy.     Surgeon: Wonda Olds   Assistants: Evonnie Pat, PA-C  Anesthesia: General endotracheal anesthesia  ASA Class: 3    Procedure Detail  PERICARDIAL WINDOW WITH THORACOTOMY APPROACH WITH MYOCARDIAL BIOPSY, TRANSESOPHAGEAL ECHOCARDIOGRAM (TEE) After informed consent was obtained, patient taken to the operating room on the above listed date.  He is placed in the supine position on the operating table.  A bump was placed beneath the left chest.  Anesthesia was confirmed a smooth fashion with general tracheal technique.  The anterior chest was cleansed and draped sterile field using Betadine paint and soap.  Preop surgical pause performed.  Incision made in the midclavicular line fifth intercostal space.  The left chest was entered safely without injuring underlying structures.  The pericardium was easily visualized with the incision.  Pericardial window was created approximately half dollar size.  Copious amount of clear inflammatory appearing pericardial fluid was evacuated and sent for culture.  The pericardium itself was also sent to pathology.  Multilevel rib block was undertaken with Exparel.  Headaches and current needle biopsies of used to biopsy the left ventricle  which was easily approached through the thoracotomy incision.  This passed off the field for pathology.  Hemostasis confirmed.  Chest tubes placed within the pericardium.  The wounds and closed in layers.  All sponge instruments and needle counts were correct and I was present for all aspects procedure.  Estimated Blood Loss:  less than 50 mL         Drains: 28 French Bard drain in the pericardium           Blood Given: none          Specimens: Pericardial fluid and pericardial sac         Implants: none        Complications:  * No complications entered in OR log *         Disposition: PACU - hemodynamically stable.         Condition: stable

## 2020-09-30 NOTE — Progress Notes (Signed)
Heart Failure Stewardship Pharmacist Progress Note   PCP: [name], MD PCP-Cardiologist: Candee Furbish, MD    HPI:  75 yo male with PMH for chronic pericardial effusion, CHF (EF 60-65% in May 2021), DM, CVA in 2014, HTN, MI, CKD3, and OSA. Presented for scheduled cardiac cath on 09/20/20 which revealed severe single-vessel disease of LAD and marginally reduced CI of 2.09. ECHO on 09/21/20 revealed worsened LVEF 35-40%, RV function moderately reduced. Pericardial effusion enlarged, appearance of myocardium concerning for potential amyloidosis, s/p pericardial window on 09/27/20. Myocardial biopsy pending. Plan for medical management of CAD rather than PCI unless he becomes symptomatic again.  Discharge HF Medications: Torsemide 20 mg daily Carvedilol 6.25 mg BID Jardiance 10 mg daily  Hydralazine 100 mg TID Isosorbide mononitrate 120 mg daily  Prior to admission HF Medications: PO furosemide 20 mg daily  Losartan 100 mg daily Hydralazine 75 mg TID Isosorbide mononitrate 60 mg BID   Pertinent Lab Values: . Serum creatinine 2.34, BUN 46, Potassium 4.7, Sodium 137, eGFR 29  Vital Signs: . Weight: 173 lbs (admission weight: 185 lbs) . Blood pressure: 120s/70s . Heart rate: 70s  Medication Assistance / Insurance Benefits Check: Does the patient have prescription insurance?  Yes Type of insurance plan: [place] [employer]  Outpatient Pharmacy:  Prior to admission outpatient pharmacy: [facility] Is the patient willing to use [facility] at discharge? Yes Is the patient willing to transition their outpatient pharmacy to utilize a [facility] outpatient pharmacy?   Pending    Assessment/Plan: 1) Acute on chronic systolic CHF (EF 03-70%), due to ICM. NYHA class II symptoms. - Agree with starting torsemide 20 mg daily at discharge - Continue carvedilol 6.25 mg BID - Continue Jardiance 10 mg daily - Continue hydralazine 100 mg TID - Continue isosorbide mononitrate 120 mg  daily - Hold on adding spironolactone until follow up visit. Trend K closely.  2) Patient assistance: Delene Loll copay $45/month - Jardiance copay $45/month  - Wilder Glade copay $95/month  Kerby Nora, PharmD, BCPS Heart Failure Stewardship Pharmacist Phone [phone]  Please check [email or web address] for unit-specific pharmacist phone numbers

## 2020-09-30 NOTE — TOC Transition Note (Signed)
Transition of Care (TOC) - CM/SW Discharge Note Marvetta Gibbons RN, BSN Transitions of Care Unit 4E- RN Case Manager See Treatment Team for direct phone #    Patient Details  Name: Corey [name] MRN: [id number] Date of Birth: 1945-12-13  Transition of Care [facility]) CM/SW Contact:  Dawayne Patricia, RN Phone Number: 09/30/2020, 2:30 PM   Clinical Narrative:    Pt stable for transition home today, recs for outpt PT follow up, pt would like referral to [facility] location. Referral has been sent via epic to [employer] for [employer]   No further transition needs noted.  Pt to return home with wife.      Final next level of care: OP Rehab Barriers to Discharge: No Barriers Identified   Patient Goals and CMS Choice Patient states their goals for this hospitalization and ongoing recovery are:: return home CMS Medicare.gov Compare Post Acute Care list provided to:: Patient Choice offered to / list presented to : NA  Discharge Placement               Home        Discharge Plan and Services In-house Referral: Clinical Social Work Discharge Planning Services: CM Consult Post Acute Care Choice: NA          DME Arranged: N/A DME Agency: NA       HH Arranged: NA HH Agency: NA        Social Determinants of Health (SDOH) Interventions Food Insecurity Interventions: Intervention Not Indicated Financial Strain Interventions: Intervention Not Indicated Housing Interventions: Intervention Not Indicated Transportation Interventions: Intervention Not Indicated   Readmission Risk Interventions Readmission Risk Prevention Plan 09/30/2020  Transportation Screening Complete  PCP or Specialist Appt within 5-7 Days Complete  Home Care Screening Complete  Medication Review (RN CM) Complete  Some recent data might be hidden

## 2020-09-30 NOTE — Progress Notes (Signed)
D/c chest tube. Pt tolerated well. VSS.   Lavenia Atlas, RN

## 2020-10-01 ENCOUNTER — Telehealth: Payer: Self-pay | Admitting: *Deleted

## 2020-10-01 ENCOUNTER — Other Ambulatory Visit: Payer: Self-pay | Admitting: Internal Medicine

## 2020-10-01 ENCOUNTER — Telehealth: Payer: Self-pay

## 2020-10-01 ENCOUNTER — Other Ambulatory Visit: Payer: Self-pay

## 2020-10-01 DIAGNOSIS — E859 Amyloidosis, unspecified: Secondary | ICD-10-CM

## 2020-10-01 LAB — KAPPA/LAMBDA LIGHT CHAINS

## 2020-10-01 NOTE — Telephone Encounter (Signed)
PYP scan ordered.  Patient called, LVM to call back, did advise we needed this scan and they would be calling to get scheduled, left call back number to answer any questions.  Message sent to scheduling to get an appointment.

## 2020-10-01 NOTE — Telephone Encounter (Signed)
-----   Message from Geralynn Rile, MD sent at 10/01/2020  7:46 AM EDT ----- Regarding: Set him up for PYP scan Almyra Free:  Can you set him up for an outpatient PYP scan?  Please call him and let him know that I recommended he complete this test for further work-up of his heart condition.  We will then get him in to see me once we have the results of that test.  [place] T. Audie Box, MD, [place]  [address] [place], [place] [address] [phone]  7:47 AM

## 2020-10-01 NOTE — Chronic Care Management (AMB) (Signed)
  Chronic Care Management   Outreach Note  10/01/2020 Name: Corey [name] MRN: [id number] DOB: [birth year]  Corey [name] is a 75 y.o. year old male who is a primary care patient of Burns, [name], MD. I reached out to Corey [name] by phone today in response to a referral sent by Mr. [name]'s PCP, Dr. Quay Burow.     An unsuccessful telephone outreach was attempted today. The patient was referred to the case management team for assistance with care management and care coordination.   Follow Up Plan: A HIPAA compliant phone message was left for the patient providing contact information and requesting a return call. The care management team will reach out to the patient again over the next 7 days. If patient returns call to provider office, please advise to call Kimberly at [phone].  [facility]

## 2020-10-05 NOTE — Progress Notes (Signed)
[facility]  PCP: Billey Gosling Primary Cardiologist: Candee Furbish  HPI:  Corey [name] is a 75 y.o.  male  with a PMH significant for  chronic pericardial effusion s/p pericardial window, amyloidosis, CAD, Combined systolic and diastolic CHF, PEA arrest, HTN, HLD, DM type 2, CVA, OSA, Cognitive Impairment,  prostate cancer  2013 went to ED for wrist pain and had chest x-ray with cardiomegaly and elevated bnp .  Followed up with ECHO by Cardiology ECHO with normal EF, severe LVH,G2DD, suggestive for cardiac amyloidosis suggest MRI.  Patient had cardiac MRI which was less suggestive of amyloid and more consistent with non obstructive HOCM.  Also positive for moderate pericardial effusion.    09/2012 patient developed respiratory failure sudden onset  at home.  He had a seizure and brief cardiac arrest requiring <11min of CPR.   His wife says that he had chills but no other symptoms on 5/7.  Early in the morning on 5/8 he awoke suddenly complaining of dyspnea.  Within minutes he stopped breathing and then had a brief episode of convulsive movements followed by unresponsiveness. Developed PEA arrest and subsequent AKI on CKD due to the arrest.  Was difficult to wean received Trach 09/30/20.  Had an ECHO done with Severe LVH, normal EF 55-60%, moderate pericardial effusion.    Had a repeat echo in 2019 for surgical clearance which demonstrated similar findings normal EF 55-60%, G3DD, moderate pericardial effusion without evidence of tamponade.    Contracted Covid 03 June 2019.  Had a negative myoview a few months later.    09/2020 saw Dr. Marlou Porch outpatient was having dyspnea on exertion and possible angina/anginal equivalent symptoms.  He was scheduled for [facility].  During admission was found to have single vessel CAD 80% LAD occlusion with severely elevated LVEDP.  Symptoms were felt to be more consistent with elevated filling pressures/volume overload and he was  treated medically and started on diuresis. 09/2020 ECHO with EF 35-40%, G2DD, RWMA, Large, pericardial effusion.   He underwent pericardial window procedure for his pericardial effusion and biopsies were taken which were consistent with amyloidosis suggestive of possible AL amyloidosis.  Serum and urine studies however were not consistent with AL amyloidosis.  PYP scan has been arranged in the outpatient setting.   He did develop an AKI during admission.   Baseline Cr around 1.7, Cr on discharge 2.34, peak was around 2.6.  Weight 185->173lbs.    Here for hospital follow up.  Feeling better since leaving the hospital.  Big improvement in shortness of breath.  No chest pain.  Hasn't walked too far lately due to balance issues.  Climbs stairs doesn't get short of breath.  No dizziness or light headedness.  Has not been weighing himself.    FH: 2 brothers with CHF, one now deceased, sister deceased from CHF.  Does have a son 54 but doesn't have any issues.    ROS: All systems negative except as listed in HPI, PMH and Problem List.  SH:  Social History   Socioeconomic History  . Marital status: Married    Spouse name: [name]  . Number of children: 2  . Years of education: 35  . Highest education level: 12th grade  Occupational History  . Not on file  Tobacco Use  . Smoking status: Former Smoker    Packs/day: 0.25    Years: 1.00    Pack years: 0.25    Types: Cigarettes    Quit  date: 05/15/1968    Years since quitting: 52.4  . Smokeless tobacco: Never Used  . Tobacco comment: smoked [place], up to 1-2 cigarettes/ day  Vaping Use  . Vaping Use: Never used  Substance and Sexual Activity  . Alcohol use: No    Alcohol/week: 0.0 standard drinks  . Drug use: No  . Sexual activity: Yes  Other Topics Concern  . Not on file  Social History Narrative   Walks three times a week, 2-3 miles, occasional walks on treadmill       Patient is right-handed. He lives with his wife in a one level  home. He drinks 1 diet Mt. Dew a day and rarely has coffee. He walks 3 x a week for exercise.   Social Determinants of Health   Financial Resource Strain: Low Risk   . Difficulty of Paying Living Expenses: Not very hard  Food Insecurity: No Food Insecurity  . Worried About Charity fundraiser in the Last Year: Never true  . Ran Out of Food in the Last Year: Never true  Transportation Needs: No Transportation Needs  . Lack of Transportation (Medical): No  . Lack of Transportation (Non-Medical): No  Physical Activity: Not on file  Stress: Not on file  Social Connections: Not on file  Intimate Partner Violence: Not on file    FH:  Family History  Problem Relation Age of Onset  . Hypertension Father   . Prostate cancer Father   . Diabetes Father   . Cancer Father   . Hypertension Mother   . Diabetes Mother   . Stroke Neg Hx   . Heart disease Neg Hx   . Colon cancer Neg Hx     Past Medical History:  Diagnosis Date  . Cataract   . Chronic heart failure with preserved ejection fraction (HFpEF) ([place])   . Colon polyp 2003   Dr [name]; F/U was to be 2008( not completed)  . CVA (cerebral infarction) 2014  . Diabetes mellitus   . Diverticulosis 2003  . Dyspnea    with exertion  . Gout   . Hypertension   . Myocardial infarction ([place]) 2014   ?? PEA cardiac arrest  . OSA (obstructive sleep apnea) 03/27/2018  . Pneumonia 09/29/11   Avelox X 10 days as OP  . Prostate cancer ([place])   . Renal insufficiency   . Seizures ([place])    not treated for seizure disorder; had a seizure after stroke 2014; no seizure since then    Current Outpatient Medications  Medication Sig Dispense Refill  . albuterol (VENTOLIN HFA) 108 (90 Base) MCG/ACT inhaler Inhale 2 puffs into the lungs every 4 (four) hours as needed for wheezing or shortness of breath. 18 g 1  . allopurinol (ZYLOPRIM) 100 MG tablet Take 0.5 tablets (50 mg total) by mouth daily. (Patient taking differently: Take 50 mg by mouth  in the morning.) 45 tablet 1  . aspirin EC 81 MG tablet Take 1 tablet (81 mg total) by mouth daily. (Patient taking differently: Take 81 mg by mouth in the morning.)    . bismuth subsalicylate (PEPTO BISMOL) 262 MG/15ML suspension Take 30 mLs by mouth every 6 (six) hours as needed for indigestion.    . carvedilol (COREG) 6.25 MG tablet Take 1 tablet (6.25 mg total) by mouth 2 (two) times daily with a meal. 60 tablet 6  . colchicine (COLCRYS) 0.6 MG tablet Take 2 tabs once and then one tab one hour later as  needed for gout (Patient taking differently: Take 0.6-1.2 mg by mouth See admin instructions. Take 2 tabs once and then one tab one hour later as needed for gout) 15 tablet 2  . empagliflozin (JARDIANCE) 10 MG TABS tablet Take 1 tablet (10 mg total) by mouth daily before breakfast. 30 tablet 6  . hydrALAZINE (APRESOLINE) 50 MG tablet Take 2 tablets (100 mg total) by mouth 3 (three) times daily. 180 tablet 6  . isosorbide mononitrate (IMDUR) 60 MG 24 hr tablet Take 60 mg by mouth in the morning and at bedtime.    . metFORMIN (GLUCOPHAGE) 500 MG tablet Take 1 tablet (500 mg total) by mouth 2 (two) times daily with a meal. (Patient taking differently: Take 500 mg by mouth at bedtime.) 180 tablet 3  . nitroGLYCERIN (NITROSTAT) 0.4 MG SL tablet Place 1 tablet (0.4 mg total) under the tongue every 5 (five) minutes as needed for chest pain. 25 tablet 3  . rosuvastatin (CRESTOR) 40 MG tablet Take 1 tablet (40 mg total) by mouth daily. 90 tablet 3  . torsemide (DEMADEX) 20 MG tablet Take 1 tablet (20 mg total) by mouth daily. 90 tablet 3  . gabapentin (NEURONTIN) 300 MG capsule Take 1 capsule (300 mg total) by mouth at bedtime. (Patient not taking: Reported on 10/08/2020) 30 capsule 0   No current facility-administered medications for this encounter.    Vitals:   10/08/20 1000  BP: 138/88  Pulse: 61  SpO2: 98%  Weight: 73.2 kg (161 lb 6.4 oz)    PHYSICAL EXAM: Cardiac: JVD flat, normal rate and  rhythm, clear s1 and s2, no murmurs, rubs or gallops, no LE edema Pulmonary: slightly decreased left base otherwise clear, not in distress Abdominal: non distended abdomen, soft and nontender Psych: Alert, conversant, in good spirits  ASSESSMENT & PLAN:   Chronic systolic heart failure/cardiac amyloidosis  -Admitted with systolic dysfunction.  EF around 40%.  Found to have severe concentric LVH.  He also had a pericardial effusion.  There was concerns for cardiac amyloidosis.  Given his pericardial effusion he underwent pericardial window with myocardial biopsy.  -His biopsy has come back positive for amyloidosis, biopsy suggested AL.  Serum and urine studies however were not consistent with AL amyloidosis.  PYP scan has been arranged in the outpatient setting. Discussed with him and his wife, they have not scheduled this yet encouraged them to call and get this scheduled.  Also discussed that he will need genetic testing.  Of note has a 17 year old son asymptomatic but a brother living with CHF -He has continued to diurese heavily at home on torsemide 20mg  and has lost 12 additional lbs.  161lbs here today.  Not weighing at home, gave him a scale today and a log book.  -NYHA class II, euvolemic to slightly dry on exam, REDS 35% -Don't think he will do well with PRN torsemide, we will cut it back from 20mg  daily to twice weekly on Monday and Friday's, helped them with his pillbox -history of bradycarida with bb therapy however tolerating 6.25mg  carvedilol BID, continue would not escalate any further given bradycardia and amyloidosis -Continue hydral 100 TID, imdur 60 daily, jardiance 10  CAD  -Initially admitted with concerns for unstable angina.  Found to have a 80% mid LAD stenosis.  However he was also found to have very elevated filling pressure consistent with congestive heart failure.  The plan for intervention was aborted. He was diuresed and symptoms improved.  -continue bb, asa,  statin   -denies any current s/s of angina  Pericardial effusion status post pericardial window  -Underwent pericardial window due to large pericardial effusion.  -Echocardiogram prior to discharge demonstrated no residual effusion    CKD stage IIIb -repeat BMP today -decrease torsemide as above   Bradycardia with first-degree block  -Tolerating BB would not escalate   Follow up with me in 3 weeks and reassess, has F/u with Dr. Marlou Porch on 10/29/20

## 2020-10-08 ENCOUNTER — Telehealth: Payer: Self-pay | Admitting: Cardiology

## 2020-10-08 ENCOUNTER — Encounter (HOSPITAL_COMMUNITY): Payer: Self-pay

## 2020-10-08 ENCOUNTER — Other Ambulatory Visit: Payer: Self-pay | Admitting: Internal Medicine

## 2020-10-08 ENCOUNTER — Other Ambulatory Visit: Payer: Self-pay

## 2020-10-08 ENCOUNTER — Ambulatory Visit (HOSPITAL_COMMUNITY)
Admit: 2020-10-08 | Discharge: 2020-10-08 | Disposition: A | Discharge status: Home / Self Care | Payer: Medicare HMO | Attending: Internal Medicine | Admitting: Internal Medicine

## 2020-10-08 VITALS — BP 138/88 | HR 61 | Wt 161.4 lb

## 2020-10-08 DIAGNOSIS — E1122 Type 2 diabetes mellitus with diabetic chronic kidney disease: Secondary | ICD-10-CM | POA: Insufficient documentation

## 2020-10-08 DIAGNOSIS — G4733 Obstructive sleep apnea (adult) (pediatric): Secondary | ICD-10-CM | POA: Insufficient documentation

## 2020-10-08 DIAGNOSIS — I440 Atrioventricular block, first degree: Secondary | ICD-10-CM | POA: Diagnosis not present

## 2020-10-08 DIAGNOSIS — I252 Old myocardial infarction: Secondary | ICD-10-CM | POA: Insufficient documentation

## 2020-10-08 DIAGNOSIS — I2510 Atherosclerotic heart disease of native coronary artery without angina pectoris: Secondary | ICD-10-CM | POA: Insufficient documentation

## 2020-10-08 DIAGNOSIS — Z8249 Family history of ischemic heart disease and other diseases of the circulatory system: Secondary | ICD-10-CM | POA: Diagnosis not present

## 2020-10-08 DIAGNOSIS — Z87891 Personal history of nicotine dependence: Secondary | ICD-10-CM | POA: Diagnosis not present

## 2020-10-08 DIAGNOSIS — E859 Amyloidosis, unspecified: Secondary | ICD-10-CM | POA: Diagnosis not present

## 2020-10-08 DIAGNOSIS — I3139 Other pericardial effusion (noninflammatory): Secondary | ICD-10-CM

## 2020-10-08 DIAGNOSIS — I43 Cardiomyopathy in diseases classified elsewhere: Secondary | ICD-10-CM | POA: Insufficient documentation

## 2020-10-08 DIAGNOSIS — E854 Organ-limited amyloidosis: Secondary | ICD-10-CM | POA: Insufficient documentation

## 2020-10-08 DIAGNOSIS — I130 Hypertensive heart and chronic kidney disease with heart failure and stage 1 through stage 4 chronic kidney disease, or unspecified chronic kidney disease: Secondary | ICD-10-CM | POA: Diagnosis not present

## 2020-10-08 DIAGNOSIS — Z7984 Long term (current) use of oral hypoglycemic drugs: Secondary | ICD-10-CM | POA: Insufficient documentation

## 2020-10-08 DIAGNOSIS — Z7982 Long term (current) use of aspirin: Secondary | ICD-10-CM | POA: Diagnosis not present

## 2020-10-08 DIAGNOSIS — I313 Pericardial effusion (noninflammatory): Secondary | ICD-10-CM | POA: Diagnosis not present

## 2020-10-08 DIAGNOSIS — R001 Bradycardia, unspecified: Secondary | ICD-10-CM

## 2020-10-08 DIAGNOSIS — Z79899 Other long term (current) drug therapy: Secondary | ICD-10-CM | POA: Insufficient documentation

## 2020-10-08 DIAGNOSIS — N1832 Chronic kidney disease, stage 3b: Secondary | ICD-10-CM | POA: Insufficient documentation

## 2020-10-08 DIAGNOSIS — I517 Cardiomegaly: Secondary | ICD-10-CM | POA: Diagnosis not present

## 2020-10-08 DIAGNOSIS — I5042 Chronic combined systolic (congestive) and diastolic (congestive) heart failure: Secondary | ICD-10-CM | POA: Diagnosis not present

## 2020-10-08 DIAGNOSIS — E785 Hyperlipidemia, unspecified: Secondary | ICD-10-CM | POA: Diagnosis not present

## 2020-10-08 LAB — BASIC METABOLIC PANEL
Anion gap: 9 (ref 5–15)
BUN: 66 mg/dL — ABNORMAL HIGH (ref 8–23)
CO2: 27 mmol/L (ref 22–32)
Calcium: 10.3 mg/dL (ref 8.9–10.3)
Chloride: 102 mmol/L (ref 98–111)
Creatinine, Ser: 2.67 mg/dL — ABNORMAL HIGH (ref 0.61–1.24)
GFR, Estimated: 25 mL/min — ABNORMAL LOW (ref >60)
Glucose, Bld: 131 mg/dL — ABNORMAL HIGH (ref 70–99)
Potassium: 4.5 mmol/L (ref 3.5–5.1)
Sodium: 138 mmol/L (ref 135–145)

## 2020-10-08 MED ORDER — TORSEMIDE 20 MG PO TABS: 20.0000 mg | ORAL_TABLET | ORAL | 3 refills | Status: DC

## 2020-10-08 NOTE — Progress Notes (Signed)
ReDS Vest / Clip - 10/08/20 1000      ReDS Vest / Clip   Station Marker C    Ruler Value 32    ReDS Value Range Low volume    ReDS Actual Value 35    Anatomical Comments sitting

## 2020-10-08 NOTE — Chronic Care Management (AMB) (Signed)
  Chronic Care Management   Outreach Note  10/08/2020 Name: Corey [name] MRN: [id number] DOB: [birth year]  Corey [name] is a 75 y.o. year old male who is a primary care patient of Burns, [name], MD. I reached out to Corey [name] by phone today in response to a referral sent by Corey [name]'s PCP, Dr. Quay Burow.      A second unsuccessful telephone outreach was attempted today. The patient was referred to the case management team for assistance with care management and care coordination.   Follow Up Plan: A HIPAA compliant phone message was left for the patient providing contact information and requesting a return call. The care management team will reach out to the patient again over the next 7 days. If patient returns call to provider office, please advise to call [place] at [phone].  [facility]

## 2020-10-08 NOTE — Telephone Encounter (Signed)
Pt was recently released from the hospital. Pt's wife is calling in reference to getting some information on starting a  heart healthy diet.Please advise

## 2020-10-08 NOTE — Patient Instructions (Signed)
DECREASE Torsemide to 20 mg, twice a week on Mondays and Fridays  Labs today We will only contact you if something comes back abnormal or we need to make some changes. Otherwise no news is good news!  Your physician recommends that you schedule a follow-up appointment in: 3 weeks with Dr Shan Levans  Do the following things EVERYDAY: 1) Weigh yourself in the morning before breakfast. Write it down and keep it in a log. 2) Take your medicines as prescribed 3) Eat low salt foods--Limit salt (sodium) to 2000 mg per day.  4) Stay as active as you can everyday 5) Limit all fluids for the day to less than 2 liters  At the [facility], you and your health needs are our priority. As part of our continuing mission to provide you with exceptional heart care, we have created designated Provider Care Teams. These Care Teams include your primary Cardiologist (physician) and Advanced Practice Providers (APPs- Physician Assistants and Nurse Practitioners) who all work together to provide you with the care you need, when you need it.   You may see any of the following providers on your designated Care Team at your next follow up: Marland Kitchen Dr Glori Bickers . Dr Loralie Champagne . Dr Vickki Muff . Darrick Grinder, NP . Lyda Jester, [place] . Audry Riles, PharmD   Please be sure to bring in all your medications bottles to every appointment.

## 2020-10-08 NOTE — Progress Notes (Addendum)
[facility] Trans[facility] Clinic Heart Failure Pharmacist Encounter  PCP: [name], MD PCP-Cardiologist: Candee Furbish, MD  Application initiated for [facility] Cares Patient Assistance Program sent in an effort to reduce the patient's out of pocket expense for Jardiance to $0.    Patient portion of the application has been completed. Faxed to [facility] for Dr. Marlou Porch to sign provider portion as PCP-cardiologist.   Completed application should be faxed to [phone]  [facility] Cares patient assistance phone number for follow up is [phone]  Jardiance 10 mg samples provided in clinic today. 4 containers (quantity of 28 tabs) provided.  Kerby Nora, PharmD, BCPS Heart Failure Transitions of Care Clinic Pharmacist [phone]

## 2020-10-08 NOTE — Telephone Encounter (Signed)
RN returned call to patients wife who was needing information regarding a heart healthy diet. Wife states they mentioned it in the hospital but she never received information with examples. RN reviewed heart healthy options as well as sent a copy through mychart for patients wife to print out. Wife appreciative of phone call.

## 2020-10-12 ENCOUNTER — Telehealth (HOSPITAL_COMMUNITY): Payer: Self-pay

## 2020-10-12 ENCOUNTER — Telehealth: Payer: Self-pay | Admitting: Cardiology

## 2020-10-12 NOTE — Telephone Encounter (Signed)
Megan of heart failure clinic was calling to see if we received the paperwork they fax over on the 27th. Please advise

## 2020-10-12 NOTE — Telephone Encounter (Signed)
Contacted [facility] to follow up on Jardiance patient assistance forms faxed for Dr. Marlou Porch to sign.   They have sent a message back to RN to confirm receipt of forms. Awaiting call back.

## 2020-10-12 NOTE — Telephone Encounter (Signed)
  According to documentation - paperwork is for pt assistance forms for Jardiance.  I have not received any paperwork/fiorms for this pt for assistance.

## 2020-10-13 ENCOUNTER — Other Ambulatory Visit: Payer: Self-pay | Admitting: Cardiothoracic Surgery

## 2020-10-13 DIAGNOSIS — Z9889 Other specified postprocedural states: Secondary | ICD-10-CM

## 2020-10-13 NOTE — Telephone Encounter (Signed)
Paperwork received, signed by Dr Marlou Porch and faxed to [facility] today at 8:45 am.

## 2020-10-13 NOTE — Telephone Encounter (Signed)
Left message for Megan - PW was faxed this AM with confirmation of receipt.  Requested she c/b if questions or concerns.

## 2020-10-14 ENCOUNTER — Encounter: Payer: Self-pay | Admitting: Cardiothoracic Surgery

## 2020-10-14 ENCOUNTER — Other Ambulatory Visit: Payer: Self-pay

## 2020-10-14 ENCOUNTER — Telehealth (HOSPITAL_COMMUNITY): Payer: Self-pay | Admitting: Internal Medicine

## 2020-10-14 ENCOUNTER — Ambulatory Visit
Admission: RE | Admit: 2020-10-14 | Discharge: 2020-10-14 | Disposition: A | Discharge status: Home / Self Care | Payer: Medicare HMO | Source: Ambulatory Visit | Attending: Cardiothoracic Surgery | Admitting: Cardiothoracic Surgery

## 2020-10-14 ENCOUNTER — Encounter (HOSPITAL_COMMUNITY): Payer: Self-pay

## 2020-10-14 ENCOUNTER — Ambulatory Visit (INDEPENDENT_AMBULATORY_CARE_PROVIDER_SITE_OTHER): Payer: Self-pay | Admitting: Cardiothoracic Surgery

## 2020-10-14 VITALS — BP 131/71 | HR 61 | Resp 20 | Ht 66.0 in | Wt 159.0 lb

## 2020-10-14 DIAGNOSIS — T8182XA Emphysema (subcutaneous) resulting from a procedure, initial encounter: Secondary | ICD-10-CM | POA: Diagnosis not present

## 2020-10-14 DIAGNOSIS — Z09 Encounter for follow-up examination after completed treatment for conditions other than malignant neoplasm: Secondary | ICD-10-CM

## 2020-10-14 DIAGNOSIS — J9811 Atelectasis: Secondary | ICD-10-CM | POA: Diagnosis not present

## 2020-10-14 DIAGNOSIS — J982 Interstitial emphysema: Secondary | ICD-10-CM | POA: Diagnosis not present

## 2020-10-14 DIAGNOSIS — M47814 Spondylosis without myelopathy or radiculopathy, thoracic region: Secondary | ICD-10-CM | POA: Diagnosis not present

## 2020-10-14 DIAGNOSIS — Z9889 Other specified postprocedural states: Secondary | ICD-10-CM

## 2020-10-14 NOTE — Progress Notes (Signed)
Subjective:    Patient ID: Corey [name], male    DOB: Aug 13, 1945, 75 y.o.   MRN: [id number]  HPI The patient is here for follow up of their chronic medical problems, including htn, DM, CKD , gout.  He was recently hospitalized for systolic HF secondary to pericardial effusion.  He had a pericardial window and myocardial biopsy which was positive for AL amyloidosis.     He is having balance issues and would like to do PT.  No falls.    He is taking all of his medications as prescribed.    No issues wth gout.   Medications and allergies reviewed with patient and updated if appropriate.  Patient Active Problem List   Diagnosis Date Noted  . Cardiac amyloidosis ([place]) 09/30/2020  . 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  . Progressive angina ([place]) - Atypical; DOE 09/20/2020  . Coronary artery disease involving native coronary artery of native heart with angina pectoris ([place]) 09/20/2020  . Acute on chronic combined systolic and diastolic CHF (congestive heart failure) ([place]) 09/20/2020  . Acute combined systolic and diastolic heart failure ([place]) 09/20/2020  . Soft tissue mass 07/30/2020  . Contracture of joint of finger of left hand 07/30/2020  . Acute respiratory disease due to COVID-19 virus 06/05/2019  . DOE (dyspnea on exertion) 03/01/2019  . Mild cognitive impairment 12/05/2018  . Slurred speech 07/23/2018  . Muscle twitching 07/23/2018  . OSA (obstructive sleep apnea) 03/27/2018  . Snores 01/21/2018  . Poor balance 01/23/2017  . Ankle swelling, left 09/06/2016  . Bradycardia 01/28/2016  . Weak urinary stream 12/31/2015  . Stage 3b chronic kidney disease 09/15/2015  . Ventral hernia without obstruction or gangrene 09/15/2015  . Prostate cancer ([place]) 07/25/2015  . Erectile dysfunction 05/04/2015  . Urinary frequency 05/04/2015  . 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 Abnormal finding on MRI of brain 02/14/2015  . PEA (Pulseless electrical  activity) ([place]) 12/03/2012  . Diabetes ([place]) 06/30/2011  . Gout 03/05/2007  . Essential hypertension 03/05/2007    Current Outpatient Medications on File Prior to Visit  Medication Sig Dispense Refill  . albuterol (VENTOLIN HFA) 108 (90 Base) MCG/ACT inhaler Inhale 2 puffs into the lungs every 4 (four) hours as needed for wheezing or shortness of breath. 18 g 1  . allopurinol (ZYLOPRIM) 100 MG tablet Take 1/2 (one-half) tablet by mouth once daily 45 tablet 1  . aspirin EC 81 MG tablet Take 1 tablet (81 mg total) by mouth daily. (Patient taking differently: Take 81 mg by mouth in the morning.)    . bismuth subsalicylate (PEPTO BISMOL) 262 MG/15ML suspension Take 30 mLs by mouth every 6 (six) hours as needed for indigestion.    . carvedilol (COREG) 6.25 MG tablet Take 1 tablet (6.25 mg total) by mouth 2 (two) times daily with a meal. 60 tablet 6  . colchicine (COLCRYS) 0.6 MG tablet Take 2 tabs once and then one tab one hour later as needed for gout (Patient taking differently: Take 0.6-1.2 mg by mouth See admin instructions. Take 2 tabs once and then one tab one hour later as needed for gout) 15 tablet 2  . empagliflozin (JARDIANCE) 10 MG TABS tablet Take 1 tablet (10 mg total) by mouth daily before breakfast. 30 tablet 6  . gabapentin (NEURONTIN) 300 MG capsule Take 1 capsule (300 mg total) by mouth at bedtime. 30 capsule 0  . hydrALAZINE (APRESOLINE) 50 MG tablet Take 2 tablets (100 mg total) by  mouth 3 (three) times daily. 180 tablet 6  . isosorbide mononitrate (IMDUR) 60 MG 24 hr tablet Take 60 mg by mouth in the morning and at bedtime.    . metFORMIN (GLUCOPHAGE) 500 MG tablet Take 1 tablet (500 mg total) by mouth 2 (two) times daily with a meal. (Patient taking differently: Take 500 mg by mouth at bedtime.) 180 tablet 3  . nitroGLYCERIN (NITROSTAT) 0.4 MG SL tablet Place 1 tablet (0.4 mg total) under the tongue every 5 (five) minutes as needed for chest pain. 25 tablet 3  . rosuvastatin  (CRESTOR) 40 MG tablet Take 1 tablet (40 mg total) by mouth daily. 90 tablet 3  . torsemide (DEMADEX) 20 MG tablet Take 1 tablet (20 mg total) by mouth 2 (two) times a week. Mondays and Fridays 8 tablet 3   No current facility-administered medications on file prior to visit.    Past Medical History:  Diagnosis Date  . Cataract   . Chronic heart failure with preserved ejection fraction (HFpEF) ([place])   . Colon polyp 2003   Dr [name]; F/U was to be 2008( not completed)  . CVA (cerebral infarction) 2014  . Diabetes mellitus   . Diverticulosis 2003  . Dyspnea    with exertion  . Gout   . Hypertension   . Myocardial infarction ([place]) 2014   ?? PEA cardiac arrest  . OSA (obstructive sleep apnea) 03/27/2018  . Pneumonia 09/29/11   Avelox X 10 days as OP  . Prostate cancer ([place])   . Renal insufficiency   . Seizures ([place])    not treated for seizure disorder; had a seizure after stroke 2014; no seizure since then    Past Surgical History:  Procedure Laterality Date  . COLONOSCOPY W/ POLYPECTOMY  2003   no F/U ([place] discussed 12/03/12)  . INSERTION OF MESH N/A 07/16/2017   Procedure: INSERTION OF MESH;  Surgeon: [name], MD;  Location: [facility];  Service: General;  Laterality: N/A;  . PEG PLACEMENT  2014  . PEG TUBE REMOVAL  2014  . PROSTATE BIOPSY    . RIGHT/LEFT HEART CATH AND CORONARY ANGIOGRAPHY N/A 09/20/2020   Procedure: RIGHT/LEFT HEART CATH AND CORONARY ANGIOGRAPHY;  Surgeon: Leonie Man, MD;  Location: [facility];  Service: Cardiovascular;  Laterality: N/A;  . TEE WITHOUT CARDIOVERSION N/A 09/27/2020   Procedure: TRANSESOPHAGEAL ECHOCARDIOGRAM (TEE);  Surgeon: Wonda Olds, MD;  Location[facility];  Service: Thoracic;  Laterality: N/A;  . TRACHEOSTOMY  2014   was closed after hospital stay  . VENTRAL HERNIA REPAIR N/A 07/16/2017   Procedure: LAPAROSCOPIC VENTRAL HERNIA REPAIR WITH MESH;  Surgeon: [name], MD;  Location: [facility];  Service: General;   Laterality: N/A;  . wrist aspiration  02/16/12    monosodium urate crystals; Dr Caralyn Guile    Social History   Socioeconomic History  . Marital status: Married    Spouse name: [name]  . Number of children: 2  . Years of education: 18  . Highest education level: 12th grade  Occupational History  . Not on file  Tobacco Use  . Smoking status: Former Smoker    Packs/day: 0.25    Years: 1.00    Pack years: 0.25    Types: Cigarettes    Quit date: 05/15/1968    Years since quitting: 52.4  . Smokeless tobacco: Never Used  . Tobacco comment: smoked [place], up to 1-2 cigarettes/ day  Vaping Use  . Vaping  Use: Never used  Substance and Sexual Activity  . Alcohol use: No    Alcohol/week: 0.0 standard drinks  . Drug use: No  . Sexual activity: Yes  Other Topics Concern  . Not on file  Social History Narrative   Walks three times a week, 2-3 miles, occasional walks on treadmill       Patient is right-handed. He lives with his wife in a one level home. He drinks 1 diet Mt. Dew a day and rarely has coffee. He walks 3 x a week for exercise.   Social Determinants of Health   Financial Resource Strain: Low Risk   . Difficulty of Paying Living Expenses: Not very hard  Food Insecurity: No Food Insecurity  . Worried About Charity fundraiser in the Last Year: Never true  . Ran Out of Food in the Last Year: Never true  Transportation Needs: No Transportation Needs  . Lack of Transportation (Medical): No  . Lack of Transportation (Non-Medical): No  Physical Activity: Not on file  Stress: Not on file  Social Connections: Not on file    Family History  Problem Relation Age of Onset  . Hypertension Father   . Prostate cancer Father   . Diabetes Father   . Cancer Father   . Hypertension Mother   . Diabetes Mother   . Stroke Neg Hx   . Heart disease Neg Hx   . Colon cancer Neg Hx     Review of Systems  Constitutional: Positive for appetite change (dec). Negative for fever.   Respiratory: Negative for cough, shortness of breath and wheezing.   Cardiovascular: Negative for chest pain, palpitations and leg swelling.  Neurological: Negative for dizziness, light-headedness and headaches.  Psychiatric/Behavioral: Positive for sleep disturbance (not sleeping good - not sleep much at night - naps a few times during day).       Objective:   Vitals:   10/15/20 1049  BP: 128/60  Pulse: 60  Temp: 98.5 F (36.9 C)  SpO2: 97%   BP Readings from Last 3 Encounters:  10/15/20 128/60  10/14/20 131/71  10/08/20 138/88   Wt Readings from Last 3 Encounters:  10/15/20 158 lb 12.8 oz (72 kg)  10/14/20 159 lb (72.1 kg)  10/08/20 161 lb 6.4 oz (73.2 kg)   Body mass index is 25.63 kg/m.   Physical Exam    Constitutional: Appears well-developed and well-nourished. No distress.  HENT:  Head: Normocephalic and atraumatic.  Neck: Neck supple. No tracheal deviation present. No thyromegaly present.  No cervical lymphadenopathy Cardiovascular: Normal rate, regular rhythm and normal heart sounds.   No murmur heard. No carotid bruit .  No edema Pulmonary/Chest: Effort normal and breath sounds normal. No respiratory distress. No has no wheezes. No rales.  Skin: Skin is warm and dry. Not diaphoretic.  Psychiatric: Normal mood and affect. Behavior is normal.      Assessment & Plan:    See Problem List for Assessment and Plan of chronic medical problems.    This visit occurred during the SARS-CoV-2 public health emergency.  Safety protocols were in place, including screening questions prior to the visit, additional usage of staff PPE, and extensive cleaning of exam room while observing appropriate contact time as indicated for disinfecting solutions.

## 2020-10-14 NOTE — Patient Instructions (Addendum)
   Medications changes include :   none   A referral was ordered for home PT.       Someone from their office will call you to schedule an appointment.     Follow up in one year.

## 2020-10-15 ENCOUNTER — Encounter: Payer: Self-pay | Admitting: Internal Medicine

## 2020-10-15 ENCOUNTER — Other Ambulatory Visit (HOSPITAL_COMMUNITY): Payer: Self-pay

## 2020-10-15 ENCOUNTER — Ambulatory Visit (INDEPENDENT_AMBULATORY_CARE_PROVIDER_SITE_OTHER): Payer: Medicare HMO | Admitting: Internal Medicine

## 2020-10-15 VITALS — BP 128/60 | HR 60 | Temp 98.5°F | Ht 66.0 in | Wt 158.8 lb

## 2020-10-15 DIAGNOSIS — I10 Essential (primary) hypertension: Secondary | ICD-10-CM

## 2020-10-15 DIAGNOSIS — R5381 Other malaise: Secondary | ICD-10-CM

## 2020-10-15 DIAGNOSIS — E1122 Type 2 diabetes mellitus with diabetic chronic kidney disease: Secondary | ICD-10-CM

## 2020-10-15 DIAGNOSIS — M1A9XX0 Chronic gout, unspecified, without tophus (tophi): Secondary | ICD-10-CM | POA: Diagnosis not present

## 2020-10-15 DIAGNOSIS — R2689 Other abnormalities of gait and mobility: Secondary | ICD-10-CM | POA: Diagnosis not present

## 2020-10-15 DIAGNOSIS — Z794 Long term (current) use of insulin: Secondary | ICD-10-CM

## 2020-10-15 MED ORDER — ALLOPURINOL 100 MG PO TABS: ORAL_TABLET | ORAL | 3 refills | Status: DC

## 2020-10-15 NOTE — Progress Notes (Signed)
[address]       [place] [address]             [phone]     CARDIOTHORACIC SURGERY OFFICE NOTE  Referring Provider is Jerline Pain, MD Primary Cardiologist is Candee Furbish, MD PCP is [name], MD   HPI:  75 year old man underwent left anterior thoracotomy for pericardial window and LV biopsy to assess his chronic LV hypertrophy.  This ultimately demonstrated amyloid disease.  He presents now for his initial postoperative examination.  He has been doing well but his appetite is poor and he has been losing weight.   Current Outpatient Medications  Medication Sig Dispense Refill  . albuterol (VENTOLIN HFA) 108 (90 Base) MCG/ACT inhaler Inhale 2 puffs into the lungs every 4 (four) hours as needed for wheezing or shortness of breath. 18 g 1  . aspirin EC 81 MG tablet Take 1 tablet (81 mg total) by mouth daily. (Patient taking differently: Take 81 mg by mouth in the morning.)    . bismuth subsalicylate (PEPTO BISMOL) 262 MG/15ML suspension Take 30 mLs by mouth every 6 (six) hours as needed for indigestion.    . carvedilol (COREG) 6.25 MG tablet Take 1 tablet (6.25 mg total) by mouth 2 (two) times daily with a meal. 60 tablet 6  . colchicine (COLCRYS) 0.6 MG tablet Take 2 tabs once and then one tab one hour later as needed for gout (Patient taking differently: Take 0.6-1.2 mg by mouth See admin instructions. Take 2 tabs once and then one tab one hour later as needed for gout) 15 tablet 2  . empagliflozin (JARDIANCE) 10 MG TABS tablet Take 1 tablet (10 mg total) by mouth daily before breakfast. 30 tablet 6  . hydrALAZINE (APRESOLINE) 50 MG tablet Take 2 tablets (100 mg total) by mouth 3 (three) times daily. 180 tablet 6  . isosorbide mononitrate (IMDUR) 60 MG 24 hr tablet Take 60 mg by mouth in the morning and at bedtime.    . metFORMIN (GLUCOPHAGE) 500 MG tablet Take 1 tablet (500 mg total) by mouth 2 (two) times daily with a meal. (Patient taking  differently: Take 500 mg by mouth at bedtime.) 180 tablet 3  . nitroGLYCERIN (NITROSTAT) 0.4 MG SL tablet Place 1 tablet (0.4 mg total) under the tongue every 5 (five) minutes as needed for chest pain. 25 tablet 3  . rosuvastatin (CRESTOR) 40 MG tablet Take 1 tablet (40 mg total) by mouth daily. 90 tablet 3  . torsemide (DEMADEX) 20 MG tablet Take 1 tablet (20 mg total) by mouth 2 (two) times a week. Mondays and Fridays 8 tablet 3  . allopurinol (ZYLOPRIM) 100 MG tablet Take 1/2 (one-half) tablet by mouth once daily 45 tablet 3   No current facility-administered medications for this visit.      Physical Exam:   BP 131/71 (BP Location: Left Arm, Patient Position: Sitting)   Pulse 61   Resp 20   Ht 5\' 6"  (1.676 m)   Wt 72.1 kg   SpO2 96% Comment: RA  BMI 25.66 kg/m   General:  Well-appearing no acute distress  Chest:   Clear to auscultation  CV:   Regular rate and rhythm  Incisions:  Healing well    Diagnostic Tests:  Chest x-ray with clear lung fields   Impression:  Doing well after left thoracotomy for pericardial window and help the biopsy  Plan:  Follow-up with thoracic surgery as needed Follow-up  with [facility] cardiology for treatment of amyloid  I spent in excess of 15 minutes during the conduct of this office consultation and >50% of this time involved direct face-to-face encounter with the patient for counseling and/or coordination of their care.  Level 2                 10 minutes Level 3                 15 minutes Level 4                 25 minutes Level 5                 40 minutes  B.  Murvin Natal, MD 10/15/2020 1:34 PM

## 2020-10-15 NOTE — Chronic Care Management (AMB) (Signed)
  Chronic Care Management   Outreach Note  10/15/2020 Name: [name] MRN: [id number] DOB: [birth year]  Corey [name] is a 75 y.o. year old male who is a primary care patient of Burns, [name], MD. I reached out to Corey [name] by phone today in response to a referral sent by Corey [name]'s PCP, Dr. Quay Burow.      Third unsuccessful telephone outreach was attempted today. The patient was referred to the case management team for assistance with care management and care coordination. The patient's primary care provider has been notified of our unsuccessful attempts to make or maintain contact with the patient. The care management team is pleased to engage with this patient at any time in the future should he/she be interested in assistance from the care management team.   Follow Up Plan: We have been unable to make contact with the patient. The care management team is available to follow up with the patient after provider conversation with the patient regarding recommendation for care management engagement and subsequent re-referral to the care management team. A HIPAA compliant phone message was left for the patient providing contact information and requesting a return call.   [facility] Management

## 2020-10-16 DIAGNOSIS — R5381 Other malaise: Secondary | ICD-10-CM | POA: Insufficient documentation

## 2020-10-16 NOTE — Assessment & Plan Note (Signed)
Chronic Worse after recent hospitalization Would benefit from home PT - will refer

## 2020-10-16 NOTE — Assessment & Plan Note (Signed)
Chronic Controlled - no gout flares Continue allopurinol 150 mg daily Continue colchicine prn for flares  1.2 mg x 1 then 0.6 mg 1 hr later

## 2020-10-16 NOTE — Assessment & Plan Note (Signed)
Chronic BP well controlled management per cardiology

## 2020-10-16 NOTE — Assessment & Plan Note (Signed)
Chronic Lab Results  Component Value Date   HGBA1C 6.4 (H) 09/21/2020    Management per Dr Dwyane Dee

## 2020-10-18 ENCOUNTER — Telehealth: Payer: Self-pay | Admitting: *Deleted

## 2020-10-18 ENCOUNTER — Telehealth (HOSPITAL_COMMUNITY): Payer: Self-pay | Admitting: Licensed Clinical Social Worker

## 2020-10-18 ENCOUNTER — Other Ambulatory Visit: Payer: Self-pay

## 2020-10-18 ENCOUNTER — Ambulatory Visit (INDEPENDENT_AMBULATORY_CARE_PROVIDER_SITE_OTHER): Payer: Medicare HMO | Admitting: Podiatry

## 2020-10-18 ENCOUNTER — Telehealth (HOSPITAL_COMMUNITY): Payer: Self-pay

## 2020-10-18 ENCOUNTER — Encounter: Payer: Self-pay | Admitting: Podiatry

## 2020-10-18 DIAGNOSIS — B351 Tinea unguium: Secondary | ICD-10-CM

## 2020-10-18 DIAGNOSIS — M79675 Pain in left toe(s): Secondary | ICD-10-CM

## 2020-10-18 DIAGNOSIS — M79674 Pain in right toe(s): Secondary | ICD-10-CM

## 2020-10-18 DIAGNOSIS — N1832 Chronic kidney disease, stage 3b: Secondary | ICD-10-CM

## 2020-10-18 DIAGNOSIS — L84 Corns and callosities: Secondary | ICD-10-CM

## 2020-10-18 DIAGNOSIS — E0822 Diabetes mellitus due to underlying condition with diabetic chronic kidney disease: Secondary | ICD-10-CM | POA: Diagnosis not present

## 2020-10-18 NOTE — Telephone Encounter (Signed)
Entered in error

## 2020-10-18 NOTE — Telephone Encounter (Signed)
Received fax from [facility] Patient Assistance program that he has been denied into [place] patient assistance program - they have determined that he may be eligible for medication through [facility]. Will send to CSW to determine if we can start getting him applied for this program.   Next HF TOC appt on 6/17.

## 2020-10-18 NOTE — Chronic Care Management (AMB) (Signed)
  Chronic Care Management   Note  10/18/2020 Name: DEMETRIUS MAHLER MRN: [id number] DOB: 07-04-1945  [name] is a 75 y.o. year old male who is a primary care patient of Burns, [name], MD. I reached out to [name] by phone today in response to a referral sent by [name]'s PCP, Dr.Burns.      [name] was given information about Chronic Care Management services today including:  1. CCM service includes personalized support from designated clinical staff supervised by his physician, including individualized plan of care and coordination with other care providers 2. 24/7 contact phone numbers for assistance for urgent and routine care needs. 3. Service will only be billed when office clinical staff spend 20 minutes or more in a month to coordinate care. 4. Only one practitioner may furnish and bill the service in a calendar month. 5. The patient may stop CCM services at any time (effective at the end of the month) by phone call to the office staff. 6. The patient will be responsible for cost sharing (co-pay) of up to 20% of the service fee (after annual deductible is met).  Patient agreed to services and verbal consent obtained.   Follow up plan: Telephone appointment with care management team member scheduled for:11/04/2020  [place] Management

## 2020-10-18 NOTE — Progress Notes (Signed)
Heart and Vascular Care Navigation  10/18/2020  Corey [name] February 15, 1946 [id number]  Reason for Referral: CSW received referral to assist patient with low income subsidy due to denial from [place] Patient Assistance.   Engaged with patient by telephone for initial visit for Heart and Vascular Care Coordination.                                                                                                   Assessment:                                      HRT/VAS Care Coordination    Patients Home Cardiology Office --  [facility]   Outpatient Care Team Social Worker   Social Worker Name: Corey [name], Marlinda Mike [phone]   Living arrangements for the past 2 months Single Family Home   Lives with: Spouse; Adult Children  Son   Patient Current Insurance Coverage Managed Medicare   Patient Has Concern With Paying Medical Bills No   Does Patient Have Prescription Coverage? Yes  Struggles with high co pays   Patient Prescription Assistance Programs Patient [place] Devices/Equipment None   DME Agency NA   [facility] Agency NA   Current home services DME      Social History:                                                                             SDOH Screenings   Alcohol Screen: Not on file  Depression (PHQ2-9): Low Risk   . PHQ-2 Score: 0  Financial Resource Strain: Medium Risk  . Difficulty of Paying Living Expenses: Somewhat hard  Food Insecurity: No Food Insecurity  . Worried About Charity fundraiser in the Last Year: Never true  . Ran Out of Food in the Last Year: Never true  Housing: Low Risk   . Last Housing Risk Score: 0  Physical Activity: Not on file  Social Connections: Not on file  Stress: Not on file  Tobacco Use: Medium Risk  . Smoking Tobacco Use: Former Smoker  . Smokeless Tobacco Use: Never Used  Transportation Needs: No Transportation Needs  . Lack of Transportation (Medical): No  . Lack of Transportation (Non-Medical): No    SDOH  Interventions: Financial Resources:    Assisted patient and wife wiht application for low income subsidy.   Food Insecurity:   n/a  Housing Insecurity:   n/a  Transportation:    n/a    Other Care Navigation Interventions:     Inpatient/Outpatient Substance Abuse Counseling/Rehab Options n/a  Provided Pharmacy assistance resources Patient Assistance Programs Patient denied due to eligibility for low income subsidy  Patient expressed Mental Health concerns No.  Patient Referred to: n/a   Follow-up plan:  CSW completed an online application for Low Income Subsidy to assist with Medicare D premium and co pays. CSW explained that patient will hear via mail with the outcome of the application. CSW will assist  with any further needs as identified. Corey [name], [place], [place]

## 2020-10-21 ENCOUNTER — Telehealth: Payer: Self-pay | Admitting: Internal Medicine

## 2020-10-21 MED ORDER — CARVEDILOL 6.25 MG PO TABS: 6.2500 mg | ORAL_TABLET | Freq: Two times a day (BID) | ORAL | 6 refills | Status: DC

## 2020-10-21 NOTE — Telephone Encounter (Signed)
Should Cardiology refill?   Patient requesting refill for carvedilol (COREG) 6.25 MG tablet  [facility] [place] [facility] [place]

## 2020-10-24 NOTE — Progress Notes (Signed)
Subjective: Corey [name] is a pleasant 75 y.o. male patient seen today painful thick toenails that are difficult to trim. Pain interferes with ambulation. Aggravating factors include wearing enclosed shoe gear. Pain is relieved with periodic professional debridement.  Wife is present during today's visit. She states he was recently hospitalized for CHF.   PCP is [name], MD. Last visit was: 10/15/2020 per patient recall.  Allergies  Allergen Reactions   Hydrochlorothiazide Other (See Comments)    Gout , uncontrolled diabetes and renal insufficiency   Objective: Physical Exam  General: Corey [name] is a pleasant 75 y.o. African American male, in NAD. AAO x 3.   Vascular:  Capillary fill time to digits <3 seconds b/l lower extremities. Faintly palpable pedal pulses b/l. Pedal hair present. Lower extremity skin temperature gradient within normal limits.  Dermatological:  Pedal skin with normal turgor, texture and tone bilaterally. No open wounds bilaterally. No interdigital macerations bilaterally. Toenails 1-5 b/l elongated, discolored, dystrophic, thickened, crumbly with subungual debris and tenderness to dorsal palpation. Hyperkeratotic lesion(s) L 2nd toe.  No erythema, no edema, no drainage, no fluctuance.  Musculoskeletal:  Normal muscle strength 5/5 to all lower extremity muscle groups bilaterally. No pain crepitus or joint limitation noted with ROM b/l. Hammertoe(s) noted to the 2-5 bilaterally.  Neurological:  Protective sensation decreased with 10 gram monofilament b/l. Vibratory sensation decreased b/l.  Assessment and Plan:  1. Pain due to onychomycosis of toenails of both feet   2. Corns   3. Diabetes mellitus due to underlying condition with stage 3b chronic kidney disease, without long-term current use of insulin ([place])     -Examined patient. -No new findings. No new orders. -Continue diabetic foot care principles. -Patient to continue soft, supportive shoe gear  daily. -Toenails 1-5 b/l were debrided in length and girth with sterile nail nippers and dremel without iatrogenic bleeding.  -Corn(s) L 2nd toe pared utilizing sterile scalpel blade without complication or incident. Total number debrided=1.Continue use of digital toe caps daily for protection. -Patient to report any pedal injuries to medical professional immediately. -Patient/POA to call should there be question/concern in the interim.  Return in about 3 months (around 01/18/2021).  Marzetta Board, DPM

## 2020-10-25 ENCOUNTER — Other Ambulatory Visit: Payer: Self-pay

## 2020-10-25 ENCOUNTER — Ambulatory Visit (INDEPENDENT_AMBULATORY_CARE_PROVIDER_SITE_OTHER): Payer: Medicare HMO | Admitting: *Deleted

## 2020-10-25 ENCOUNTER — Other Ambulatory Visit: Payer: Self-pay | Admitting: Internal Medicine

## 2020-10-25 DIAGNOSIS — M2041 Other hammer toe(s) (acquired), right foot: Secondary | ICD-10-CM

## 2020-10-25 DIAGNOSIS — M2011 Hallux valgus (acquired), right foot: Secondary | ICD-10-CM

## 2020-10-25 DIAGNOSIS — E0822 Diabetes mellitus due to underlying condition with diabetic chronic kidney disease: Secondary | ICD-10-CM

## 2020-10-25 DIAGNOSIS — L84 Corns and callosities: Secondary | ICD-10-CM

## 2020-10-25 DIAGNOSIS — M2012 Hallux valgus (acquired), left foot: Secondary | ICD-10-CM

## 2020-10-25 DIAGNOSIS — M2042 Other hammer toe(s) (acquired), left foot: Secondary | ICD-10-CM

## 2020-10-25 DIAGNOSIS — N1832 Chronic kidney disease, stage 3b: Secondary | ICD-10-CM

## 2020-10-25 NOTE — Progress Notes (Signed)
Patient presents to the office today for diabetic shoe and insole measuring.  Patient was measured with brannock device to determine size and width for 1 pair of extra depth shoes and foam casted for 3 pair of insoles.   Documentation of medical necessity will be sent to patient's treating diabetic doctor to verify and sign.   Patient's diabetic provider: Dr. Elayne Snare  Shoes and insoles will be ordered at that time and patient will be notified for an appointment for fitting when they arrive.   Shoe size (per patient): 11   Brannock measurement: Right - 10.5 2E, Left - 10.5 E  Patient shoe selection-   1st choice:   Orthofeet 674  2nd choice:  Apesx [id number]  Shoe size ordered: Men's size 11 X-Wide

## 2020-10-26 ENCOUNTER — Telehealth (HOSPITAL_COMMUNITY): Payer: Self-pay | Admitting: Licensed Clinical Social Worker

## 2020-10-26 NOTE — Telephone Encounter (Signed)
CSW contacted patient to remind of [facility] appointment tomorrow. Message left. [name], [place], [place]

## 2020-10-27 ENCOUNTER — Encounter (HOSPITAL_COMMUNITY): Payer: Self-pay

## 2020-10-27 ENCOUNTER — Other Ambulatory Visit: Payer: Self-pay

## 2020-10-27 ENCOUNTER — Ambulatory Visit (HOSPITAL_COMMUNITY)
Admission: RE | Admit: 2020-10-27 | Discharge: 2020-10-27 | Disposition: A | Discharge status: Home / Self Care | Payer: Medicare HMO | Source: Ambulatory Visit | Attending: Internal Medicine | Admitting: Internal Medicine

## 2020-10-27 VITALS — BP 124/66 | HR 63 | Wt 157.6 lb

## 2020-10-27 DIAGNOSIS — I130 Hypertensive heart and chronic kidney disease with heart failure and stage 1 through stage 4 chronic kidney disease, or unspecified chronic kidney disease: Secondary | ICD-10-CM | POA: Insufficient documentation

## 2020-10-27 DIAGNOSIS — Z7982 Long term (current) use of aspirin: Secondary | ICD-10-CM | POA: Insufficient documentation

## 2020-10-27 DIAGNOSIS — E1122 Type 2 diabetes mellitus with diabetic chronic kidney disease: Secondary | ICD-10-CM | POA: Insufficient documentation

## 2020-10-27 DIAGNOSIS — Z7984 Long term (current) use of oral hypoglycemic drugs: Secondary | ICD-10-CM | POA: Diagnosis not present

## 2020-10-27 DIAGNOSIS — Z79899 Other long term (current) drug therapy: Secondary | ICD-10-CM | POA: Insufficient documentation

## 2020-10-27 DIAGNOSIS — I2510 Atherosclerotic heart disease of native coronary artery without angina pectoris: Secondary | ICD-10-CM

## 2020-10-27 DIAGNOSIS — I252 Old myocardial infarction: Secondary | ICD-10-CM | POA: Diagnosis not present

## 2020-10-27 DIAGNOSIS — I440 Atrioventricular block, first degree: Secondary | ICD-10-CM | POA: Diagnosis not present

## 2020-10-27 DIAGNOSIS — Z87891 Personal history of nicotine dependence: Secondary | ICD-10-CM | POA: Diagnosis not present

## 2020-10-27 DIAGNOSIS — I43 Cardiomyopathy in diseases classified elsewhere: Secondary | ICD-10-CM | POA: Diagnosis not present

## 2020-10-27 DIAGNOSIS — E859 Amyloidosis, unspecified: Secondary | ICD-10-CM | POA: Diagnosis not present

## 2020-10-27 DIAGNOSIS — I313 Pericardial effusion (noninflammatory): Secondary | ICD-10-CM | POA: Insufficient documentation

## 2020-10-27 DIAGNOSIS — E854 Organ-limited amyloidosis: Secondary | ICD-10-CM | POA: Diagnosis not present

## 2020-10-27 DIAGNOSIS — E785 Hyperlipidemia, unspecified: Secondary | ICD-10-CM | POA: Diagnosis not present

## 2020-10-27 DIAGNOSIS — R001 Bradycardia, unspecified: Secondary | ICD-10-CM | POA: Insufficient documentation

## 2020-10-27 DIAGNOSIS — I3139 Other pericardial effusion (noninflammatory): Secondary | ICD-10-CM

## 2020-10-27 DIAGNOSIS — N1832 Chronic kidney disease, stage 3b: Secondary | ICD-10-CM

## 2020-10-27 DIAGNOSIS — Z8249 Family history of ischemic heart disease and other diseases of the circulatory system: Secondary | ICD-10-CM | POA: Diagnosis not present

## 2020-10-27 DIAGNOSIS — Z8674 Personal history of sudden cardiac arrest: Secondary | ICD-10-CM | POA: Insufficient documentation

## 2020-10-27 DIAGNOSIS — G4733 Obstructive sleep apnea (adult) (pediatric): Secondary | ICD-10-CM | POA: Insufficient documentation

## 2020-10-27 DIAGNOSIS — I5042 Chronic combined systolic (congestive) and diastolic (congestive) heart failure: Secondary | ICD-10-CM | POA: Diagnosis not present

## 2020-10-27 LAB — BASIC METABOLIC PANEL
Anion gap: 8 (ref 5–15)
BUN: 50 mg/dL — ABNORMAL HIGH (ref 8–23)
CO2: 24 mmol/L (ref 22–32)
Calcium: 10.1 mg/dL (ref 8.9–10.3)
Chloride: 108 mmol/L (ref 98–111)
Creatinine, Ser: 2.29 mg/dL — ABNORMAL HIGH (ref 0.61–1.24)
GFR, Estimated: 30 mL/min — ABNORMAL LOW (ref >60)
Glucose, Bld: 98 mg/dL (ref 70–99)
Potassium: 4.1 mmol/L (ref 3.5–5.1)
Sodium: 140 mmol/L (ref 135–145)

## 2020-10-27 NOTE — Progress Notes (Signed)
Heart and Vascular Care Navigation  10/27/2020  Corey [name] [facility] 10-Apr-1946 [id number]  Reason for Referral: Patient seen for follow up in [facility].    Engaged with patient face to face for follow up visit for Heart and Vascular Care Coordination.                                                                                                   Assessment:  Patient and wife came for follow up visit in [place]. CSW assisted prior to visit with an online application for Low income subsidy and reviewed process. Patient's wife states that  they have not heard anything yet regarding determination for subsidy.   Patient's wife reports that patient has hearing difficulties and requests assistance with some hearing aide assistance program.                              HRT/VAS Care Coordination     Patients Home Cardiology Office --  [facility] [place] Team Social Worker   Social Worker Name: [name], Corey [name] [phone]   Living arrangements for the past 2 months Single Family Home   Lives with: Spouse; Adult Children  Son   Patient Current Insurance Coverage Managed Medicare   Patient Has Concern With Paying Medical Bills No   Does Patient Have Prescription Coverage? Yes  Struggles with high co pays   Patient Prescription Assistance Programs Patient [place] Devices/Equipment None   DME Agency NA   [facility] Agency NA   Current home services DME       Social History:                                                                             SDOH Screenings   Alcohol Screen: Not on file  Depression (PHQ2-9): Low Risk    PHQ-2 Score: 0  Financial Resource Strain: Medium Risk   Difficulty of Paying Living Expenses: Somewhat hard  Food Insecurity: No Food Insecurity   Worried About Charity fundraiser in the Last Year: Never true   Ran Out of Food in the Last Year: Never true  Housing: Low Risk    Last Housing Risk Score: 0  Physical Activity: Not on file   Social Connections: Not on file  Stress: Not on file  Tobacco Use: Medium Risk   Smoking Tobacco Use: Former   Smokeless Tobacco Use: Never  Transportation Needs: No Transportation Needs   Lack of Transportation (Medical): No   Lack of Transportation (Non-Medical): No    SDOH Interventions: Financial Resources:    Low income subsidy application completed and awaiting approval.   Food Insecurity:   N/a  Housing Insecurity:   N/a  Transportation:  N/a   Follow-up plan:  CSW will explore resources for hearing aide assistance although informed patient that there are limited available resources. Patient verbalizes understanding and will contact CSW if needed. [name], [place], [place]

## 2020-10-27 NOTE — Patient Instructions (Signed)
Continue current medications  Labs done today, your results will be available in MyChart, we will contact you for abnormal readings.  Thank you for allowing [place] to provider your heart failure care after your recent hospitalization. Please follow-up with Dr Marlou Porch at [facility] as scheduled  If you have any questions, issues, or concerns before your next appointment please call our office at [phone], opt. [phone] and leave a message for the triage nurse.  Do the following things EVERYDAY: Weigh yourself in the morning before breakfast. Write it down and keep it in a log. Take your medicines as prescribed Eat low salt foods--Limit salt (sodium) to 2000 mg per day.  Stay as active as you can everyday Limit all fluids for the day to less than 2 liters  Weigh yourself EVERY morning after you go to the bathroom but before you eat or drink anything. Write this number down in a weight log/diary. If you gain 3 pounds overnight or 5 pounds in a week, call the heart failure clinic  Restrict your sodium intake to less than 2000mg  per day. This will help prevent your body from holding onto fluid. Read food labels as a lot of canned and packaged foods have a lot of sodium.  Limit your fluid intake to less than 2 liters of fluid per day. Fluid includes all drinks, coffee, juice, ice chips, soup, jello, and all other liquids.

## 2020-10-27 NOTE — Progress Notes (Signed)
ReDS Vest / Clip - 10/27/20 1012       ReDS Vest / Clip   Station Marker C    Ruler Value 28    ReDS Value Range Moderate volume overload    ReDS Actual Value 38

## 2020-10-27 NOTE — Progress Notes (Signed)
[facility] V[facility] Transitions of Care Clinic  PCP: Billey Gosling Primary Cardiologist: Candee Furbish  HPI:  Corey [name] is a 75 y.o.  male  with a PMH significant for  chronic pericardial effusion s/p pericardial window, amyloidosis, CAD, Combined systolic and diastolic CHF, PEA arrest, HTN, HLD, DM type 2, CVA, OSA, Cognitive Impairment,  prostate cancer  2013 went to ED for wrist pain and had chest x-ray with cardiomegaly and elevated bnp .  Followed up with ECHO by Cardiology ECHO with normal EF, severe LVH,G2DD, suggestive for cardiac amyloidosis suggest MRI.  Patient had cardiac MRI which was less suggestive of amyloid and more consistent with non obstructive HOCM.  Also positive for moderate pericardial effusion.    09/2012 patient developed respiratory failure sudden onset  at home.  He had a seizure and brief cardiac arrest requiring <85min of CPR.   His wife says that he had chills but no other symptoms on 5/7.  Early in the morning on 5/8 he awoke suddenly complaining of dyspnea.  Within minutes he stopped breathing and then had a brief episode of convulsive movements followed by unresponsiveness. Developed PEA arrest and subsequent AKI on CKD due to the arrest.  Was difficult to wean received Trach 09/30/20.  Had an ECHO done with Severe LVH, normal EF 55-60%, moderate pericardial effusion.    Had a repeat echo in 2019 for surgical clearance which demonstrated similar findings normal EF 55-60%, G3DD, moderate pericardial effusion without evidence of tamponade.    Contracted Covid 03 June 2019.  Had a negative myoview a few months later.    09/2020 saw Dr. Marlou Porch outpatient was having dyspnea on exertion and possible angina/anginal equivalent symptoms.  He was scheduled for [facility].  During admission was found to have single vessel CAD 80% LAD occlusion with severely elevated LVEDP.  Symptoms were felt to be more consistent with elevated filling pressures/volume overload and he was  treated medically and started on diuresis. 09/2020 ECHO with EF 35-40%, G2DD, RWMA, Large, pericardial effusion.   He underwent pericardial window procedure for his pericardial effusion and biopsies were taken which were consistent with amyloidosis suggestive of possible AL amyloidosis.  Serum and urine studies however were not consistent with AL amyloidosis.  PYP scan has been arranged in the outpatient setting.   He did develop an AKI during admission.   Baseline Cr around 1.7, Cr on discharge 2.34, peak was around 2.6.  Weight 185->173lbs.    FH: 2 brothers with CHF, one now deceased, sister deceased from CHF.  Does have a son 57 but doesn't have any issues.    Last visit was here for hospital follow up his weight was down an additional 12lbs compared with discharge weight, urinating a lot with torsemide so we decreased to twice weekly.    Here for FU visit, breathing has been doing well.  Weighing himself daily, brought a log with him weight steady at 156lbs.  Feeling well overall, did get short of breath the other day when he was having trouble starting the lawnmower (pull start) took a lot of pulls and wouldn't start.  Otherwise exercise capacity doing pretty well, moving slow overall due to arthritis.  Has not had PYP scan yet due to radiotracer shortage.    ROS: All systems negative except as listed in HPI, PMH and Problem List.  SH:  Social History   Socioeconomic History   Marital status: Married    Spouse name: [name]   Number of children:  2   Years of education: 12   Highest education level: 12th grade  Occupational History   Not on file  Tobacco Use   Smoking status: Former    Packs/day: 0.25    Years: 1.00    Pack years: 0.25    Types: Cigarettes    Quit date: 05/15/1968    Years since quitting: 52.4   Smokeless tobacco: Never   Tobacco comments:    smoked [place], up to 1-2 cigarettes/ day  Vaping Use   Vaping Use: Never used  Substance and Sexual Activity    Alcohol use: No    Alcohol/week: 0.0 standard drinks   Drug use: No   Sexual activity: Yes  Other Topics Concern   Not on file  Social History Narrative   Walks three times a week, 2-3 miles, occasional walks on treadmill       Patient is right-handed. He lives with his wife in a one level home. He drinks 1 diet Mt. Dew a day and rarely has coffee. He walks 3 x a week for exercise.   Social Determinants of Health   Financial Resource Strain: Medium Risk   Difficulty of Paying Living Expenses: Somewhat hard  Food Insecurity: No Food Insecurity   Worried About Charity fundraiser in the Last Year: Never true   Ran Out of Food in the Last Year: Never true  Transportation Needs: No Transportation Needs   Lack of Transportation (Medical): No   Lack of Transportation (Non-Medical): No  Physical Activity: Not on file  Stress: Not on file  Social Connections: Not on file  Intimate Partner Violence: Not on file    FH:  Family History  Problem Relation Age of Onset   Hypertension Father    Prostate cancer Father    Diabetes Father    Cancer Father    Hypertension Mother    Diabetes Mother    Stroke Neg Hx    Heart disease Neg Hx    Colon cancer Neg Hx     Past Medical History:  Diagnosis Date   Cataract    Chronic heart failure with preserved ejection fraction (HFpEF) ([place])    Colon polyp 2003   Dr [name]; F/U was to be 2008( not completed)   CVA (cerebral infarction) 2014   Diabetes mellitus    Diverticulosis 2003   Dyspnea    with exertion   Gout    Hypertension    Myocardial infarction ([place]) 2014   ?? PEA cardiac arrest   OSA (obstructive sleep apnea) 03/27/2018   Pneumonia 09/29/11   Avelox X 10 days as OP   Prostate cancer ([place])    Renal insufficiency    Seizures ([place])    not treated for seizure disorder; had a seizure after stroke 2014; no seizure since then    Current Outpatient Medications  Medication Sig Dispense Refill   albuterol (VENTOLIN HFA)  108 (90 Base) MCG/ACT inhaler Inhale 2 puffs into the lungs every 4 (four) hours as needed for wheezing or shortness of breath. 18 g 1   allopurinol (ZYLOPRIM) 100 MG tablet Take 1/2 (one-half) tablet by mouth once daily 45 tablet 3   aspirin EC 81 MG tablet Take 1 tablet (81 mg total) by mouth daily. (Patient taking differently: Take 81 mg by mouth in the morning.)     bismuth subsalicylate (PEPTO BISMOL) 262 MG/15ML suspension Take 30 mLs by mouth every 6 (six) hours as needed for indigestion.  carvedilol (COREG) 6.25 MG tablet Take 1 tablet (6.25 mg total) by mouth 2 (two) times daily with a meal. 60 tablet 6   colchicine (COLCRYS) 0.6 MG tablet Take 2 tabs once and then one tab one hour later as needed for gout (Patient taking differently: Take 0.6-1.2 mg by mouth See admin instructions. Take 2 tabs once and then one tab one hour later as needed for gout) 15 tablet 2   empagliflozin (JARDIANCE) 10 MG TABS tablet Take 1 tablet (10 mg total) by mouth daily before breakfast. 30 tablet 6   hydrALAZINE (APRESOLINE) 50 MG tablet Take 2 tablets (100 mg total) by mouth 3 (three) times daily. 180 tablet 6   metFORMIN (GLUCOPHAGE) 500 MG tablet Take 1 tablet (500 mg total) by mouth 2 (two) times daily with a meal. (Patient taking differently: Take 500 mg by mouth at bedtime.) 180 tablet 3   nitroGLYCERIN (NITROSTAT) 0.4 MG SL tablet Place 1 tablet (0.4 mg total) under the tongue every 5 (five) minutes as needed for chest pain. 25 tablet 3   rosuvastatin (CRESTOR) 40 MG tablet Take 1 tablet (40 mg total) by mouth daily. 90 tablet 3   torsemide (DEMADEX) 20 MG tablet Take 1 tablet (20 mg total) by mouth 2 (two) times a week. Mondays and Fridays 8 tablet 3   No current facility-administered medications for this encounter.    Vitals:   10/27/20 1012  BP: 124/66  Pulse: 63  SpO2: 99%  Weight: 71.5 kg (157 lb 9.6 oz)    PHYSICAL EXAM: Cardiac: JVD flat, normal rate and rhythm, clear s1 and s2, no  murmurs, rubs or gallops, no LE edema Pulmonary: CTAB, not in distress Abdominal: non distended abdomen, soft and nontender Psych: Alert, conversant, in good spirits  ASSESSMENT & PLAN:   Chronic systolic heart failure/cardiac amyloidosis  -09/2020 EF around 40%.  Found to have severe concentric LVH.  He also had a pericardial effusion.  There was concerns for cardiac amyloidosis.  Given his pericardial effusion he underwent pericardial window with myocardial biopsy.  -His biopsy has come back positive for amyloidosis, biopsy suggested AL.  Serum and urine studies however were not consistent with AL amyloidosis.  PYP scan getting arranged in the outpatient setting. Discussed with him and his wife, they have not scheduled this yet encouraged them to call and get this scheduled.  Also discussed that he will need genetic testing.  Of note has a 67 year old son asymptomatic and also a brother living with CHF -Has not had PYP scan yet due to radiotracer shortage.   -last visit we decreased his torsemide, weight has stabilized.  -NYHA class II, euvolemic on exam -continue twice weekly Torsemide on Monday and Friday's -history of bradycarida with bb therapy however tolerating 6.25mg  carvedilol BID, continue would not escalate any further given bradycardia and amyloidosis -Continue hydral 100 TID, imdur 60 daily, jardiance 10 -repeat bmp to see if renal fx has improved/stabilized  CAD  -Initially admitted with concerns for unstable angina.  Found to have a 80% mid LAD stenosis.  However he was also found to have very elevated filling pressure consistent with congestive heart failure.  The plan for intervention was aborted. He was diuresed and symptoms improved.  -continue bb, asa, statin  -denies any current s/s of angina    Pericardial effusion status post pericardial window  -Underwent pericardial window due to large pericardial effusion.  -Echocardiogram prior to discharge demonstrated no residual  effusion  CKD stage IIIb -repeat BMP today    Bradycardia with first-degree block  -Tolerating BB would not escalate    F/u with Dr. Marlou Porch on 11/24/2020

## 2020-10-28 ENCOUNTER — Telehealth: Payer: Self-pay | Admitting: Pharmacist

## 2020-10-29 ENCOUNTER — Ambulatory Visit (HOSPITAL_COMMUNITY): Payer: Medicare HMO

## 2020-11-04 ENCOUNTER — Telehealth: Payer: Self-pay

## 2020-11-04 ENCOUNTER — Other Ambulatory Visit: Payer: Self-pay

## 2020-11-04 ENCOUNTER — Ambulatory Visit (INDEPENDENT_AMBULATORY_CARE_PROVIDER_SITE_OTHER): Payer: Medicare HMO | Admitting: *Deleted

## 2020-11-04 DIAGNOSIS — E1122 Type 2 diabetes mellitus with diabetic chronic kidney disease: Secondary | ICD-10-CM

## 2020-11-04 DIAGNOSIS — I5040 Unspecified combined systolic (congestive) and diastolic (congestive) heart failure: Secondary | ICD-10-CM

## 2020-11-04 NOTE — Telephone Encounter (Signed)
I contacted patient and notified that we would try to get him scheduled for the PYP scan. Patient and his wife verbalized understanding.   PYP scans are on hold currently due to a shortage, MD notified.

## 2020-11-04 NOTE — Chronic Care Management (AMB) (Signed)
Chronic Care Management   CCM RN Visit Note  11/04/2020 Name: Corey [name] MRN: [id number] DOB: May 27, 1945  Subjective: Corey [name] is a 75 y.o. year old male who is a primary care patient of Burns, [name], MD. The care management team was consulted for assistance with disease management and care coordination needs.    Engaged with patient and his spouse Corey [name] by telephone for initial visit in response to provider referral for case management and/or care coordination services. Patient provides this CCM RN CM verbal consent to speak with spouse Corey [name] "at any time" now and in the future.  Consent to Services:  The patient was given information about Chronic Care Management services, agreed to services, and gave verbal consent 10/18/20 prior to initiation of services.  Please see initial visit note for detailed documentation.  Patient agreed to services and verbal consent obtained.   Assessment: Review of patient past medical history, allergies, medications, health status, including review of consultants reports, laboratory and other test data, was performed as part of comprehensive evaluation and provision of chronic care management services.   SDOH (Social Determinants of Health) assessments and interventions performed:  SDOH Interventions    Flowsheet Row Most Recent Value  SDOH Interventions   Food Insecurity Interventions Intervention Not Indicated  Housing Interventions Intervention Not Indicated  Transportation Interventions Intervention Not Indicated  [Spouse provides transportation]       CCM Care Plan  Allergies  Allergen Reactions   Hydrochlorothiazide Other (See Comments)    Gout , uncontrolled diabetes and renal insufficiency    Outpatient Encounter Medications as of 11/04/2020  Medication Sig   albuterol (VENTOLIN HFA) 108 (90 Base) MCG/ACT inhaler Inhale 2 puffs into the lungs every 4 (four) hours as needed for wheezing or shortness of breath.   allopurinol (ZYLOPRIM)  100 MG tablet Take 1/2 (one-half) tablet by mouth once daily   aspirin EC 81 MG tablet Take 1 tablet (81 mg total) by mouth daily. (Patient taking differently: Take 81 mg by mouth in the morning.)   bismuth subsalicylate (PEPTO BISMOL) 262 MG/15ML suspension Take 30 mLs by mouth every 6 (six) hours as needed for indigestion.   carvedilol (COREG) 6.25 MG tablet Take 1 tablet (6.25 mg total) by mouth 2 (two) times daily with a meal.   colchicine (COLCRYS) 0.6 MG tablet Take 2 tabs once and then one tab one hour later as needed for gout (Patient taking differently: Take 0.6-1.2 mg by mouth See admin instructions. Take 2 tabs once and then one tab one hour later as needed for gout)   empagliflozin (JARDIANCE) 10 MG TABS tablet Take 1 tablet (10 mg total) by mouth daily before breakfast.   hydrALAZINE (APRESOLINE) 50 MG tablet Take 2 tablets (100 mg total) by mouth 3 (three) times daily.   metFORMIN (GLUCOPHAGE) 500 MG tablet Take 1 tablet (500 mg total) by mouth 2 (two) times daily with a meal. (Patient taking differently: Take 500 mg by mouth at bedtime.)   nitroGLYCERIN (NITROSTAT) 0.4 MG SL tablet Place 1 tablet (0.4 mg total) under the tongue every 5 (five) minutes as needed for chest pain.   rosuvastatin (CRESTOR) 40 MG tablet Take 1 tablet (40 mg total) by mouth daily.   torsemide (DEMADEX) 20 MG tablet Take 1 tablet (20 mg total) by mouth 2 (two) times a week. Mondays and Fridays   No facility-administered encounter medications on file as of 11/04/2020.    Patient Active Problem List   Diagnosis Date Noted  Physical deconditioning 10/16/2020   Cardiac amyloidosis ([place]) 09/30/2020   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   Progressive angina (HCC) - Atypical; DOE 09/20/2020   Coronary artery disease involving native coronary artery of native heart with angina pectoris ([place]) 09/20/2020   Acute on chronic combined systolic and diastolic CHF (congestive heart failure) ([place]) 09/20/2020    Acute combined systolic and diastolic heart failure ([place]) 09/20/2020   Soft tissue mass 07/30/2020   Contracture of joint of finger of left hand 07/30/2020   Acute respiratory disease due to COVID-19 virus 06/05/2019   DOE (dyspnea on exertion) 03/01/2019   Mild cognitive impairment 12/05/2018   Slurred speech 07/23/2018   Muscle twitching 07/23/2018   OSA (obstructive sleep apnea) 03/27/2018   Snores 01/21/2018   Poor balance 01/23/2017   Ankle swelling, left 09/06/2016   Bradycardia 01/28/2016   Weak urinary stream 12/31/2015   Stage 3b chronic kidney disease 09/15/2015   Ventral hernia without obstruction or gangrene 09/15/2015   Prostate cancer ([place]) 07/25/2015   Erectile dysfunction 05/04/2015   Urinary frequency 05/04/2015   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   Abnormal finding on MRI of brain 02/14/2015   PEA (Pulseless electrical activity) ([place]) 12/03/2012   Diabetes ([place]) 06/30/2011   Gout 03/05/2007   Essential hypertension 03/05/2007    Conditions to be addressed/monitored:CHF and DMII  Care Plan : Heart Failure (Adult)  Updates made by Knox Royalty, RN since 11/04/2020 12:00 AM     Problem: Symptom Exacerbation (Heart Failure)   Priority: Medium     Long-Range Goal: Symptom Exacerbation Prevented or Minimized   Start Date: 11/04/2020  Expected End Date: 11/04/2021  This Visit's Progress: On track  Priority: Medium  Note:   Current Barriers:  Knowledge deficit related to basic heart failure pathophysiology and self care management Unable to independently manage medications: reports decreased vision due to glaucoma Limited health literacy, hard of hearing in (L) ear: wife participates in all of patent medical affairs and is very supportive/ helpful Case Manager Clinical Goal(s):  Over the next 12 months, patient will verbalize understanding of Heart Failure Action Plan and when to call doctor, as evidenced by patient/ caregiver  reporting during CCM RN CM outreach of: Completion of daily weight monitoring/ recording (notifying MD of 3 lb weight gain over night or 5 lb in a week) Adhering to low salt diet Taking prescribed medications as prescribed Interventions:  Collaboration with [name], MD regarding development and update of comprehensive plan of care as evidenced by provider attestation and co-signature Inter-disciplinary care team collaboration (see longitudinal plan of care Chart reviewed including relevant office notes, upcoming scheduled appointments, and lab results Discussed current  clinical condition with patient/ caregiver and confirmed no current clinical concerns; confirmed patient's spouse manages medications for patient due to decreased vision; decline medication review today; informed them  that CCM Pharmacy team has been trying to contact them and encouraged them to re-engage with CCM Pharmacy team; patient and spouse report good adherence to medication regimen Reviewed recent hospitalization May 9-19, 2022 with patient and spouse: today, the deny current clinical concerns around patient's post-hospital discharge status; denies issues around breathing, LE swelling Reviewed recent PCP office visit with patient and spouse: they report they have not heard anything from home health PT post-recent PCP order on 10/15/20: will message PCP and request re-order of home health services Basic overview and discussion of pathophysiology of Heart Failure education initiated: patient and spouse could benefit from ongoing  reinforcement of same Assessed need for readable accurate scales in home: patient has scales and is monitoring weights daily but does not understand why: rationale provided Provided verbal education around signs/ symptoms yellow CHF zone/ weight gain guidelines in setting of CHF along with corresponding action plan:  patient and spouse will require ongoing reinforcement around same Reviewed recent  weights at home with patient and spouse: they report consistent ranges between 155-158 lbs; weight reported today: 157 lbs Confirmed patient is taking diuretic as prescribed: twice weekly; reviewed role of diuretics in prevention of fluid overload and management of heart failure while taking care to prevent dehydration and maintain fluid balance in setting of concurrent CKD Confirmed patient endorses "trying to" follow low salt, heart healthy diet-- feels he is making good progress and getting used to diet: positive reinforcement provided Reviewed upcoming provider appointments with patient and confirmed that patient has plans to attend all as scheduled: November 24, 2020: cardiology provider office visit Self-Care Activities: Patient verbalizes understanding of plan to continue monitoring/ recording daily weights at home Spouse administers medications as prescribed, due to patient vision issues Attends all scheduled provider appointments Calls pharmacy for medication refills Performs ADL's/ iADL's independently Calls provider office for new concerns or questions Patient Goals: Continue to weigh yourself daily and write your weights at home down on paper or on the calendar- we will review these each time we have a telephone visit.  Today you reported a weight of 157 lbs Call office if I gain more than 2 pounds in one day or 5 pounds in one week, especially if this is associated with chest tightness, swelling in your legs/ ankles, and new or worsened shortness of breath Watch for swelling in feet, ankles and legs every day Keep legs up while sitting Limit the salt in your diet: decrease your salt intake and use salt only in moderation Please read over the attached information- we will review this information periodically during our phone conversations Follow Up Plan:  Telephone follow up appointment with care management team member scheduled for: Thursday, November 25, 2020 at 1:30 pm The patient has been  provided with contact information for the care management team and has been advised to call with any health related questions or concerns.      Care Plan : Diabetes Type 2 (Adult)  Updates made by Knox Royalty, RN since 11/04/2020 12:00 AM     Problem: Glycemic Management (Diabetes, Type 2)   Priority: Low     Long-Range Goal: Glycemic Management Optimized   Start Date: 11/04/2020  Expected End Date: 11/04/2021  This Visit's Progress: On track  Priority: Low  Note:   Objective:  Lab Results  Component Value Date   HGBA1C 6.4 (H) 09/21/2020   Lab Results  Component Value Date   CREATININE 2.29 (H) 10/27/2020   CREATININE 2.67 (H) 10/08/2020   CREATININE 2.34 (H) 09/30/2020   Lab Results  Component Value Date   EGFR 42 (L) 09/15/2020   Current Barriers:  Knowledge Deficits related to basic Diabetes pathophysiology and self care/management: patient and spouse could benefit from ongoing reinforcement of established knowledge baseline Difficulty obtaining or cannot afford medications- reports difficult to afford Jardiance- will make CCM Pharmacy team aware of same Unable to self administer medications as prescribed- reports wife manages medications due to poor vision Case Manager Clinical Goal(s):  Over the next 12 months, patient will demonstrate improved adherence to prescribed treatment plan for diabetes self care/management as evidenced by patient and  careiver reporting during CCM RN CM outreach of:  Daily monitoring and recording of CBG   Adherence to ADA/ carb modified diet  Adherence to prescribed medication regimen  Contacting provider for new or worsened symptoms or questions Interventions:  Collaboration with [name], MD regarding development and update of comprehensive plan of care as evidenced by provider attestation and co-signature Inter-disciplinary care team collaboration (see longitudinal plan of care) Review of patient status, including review of  consultants reports, relevant laboratory and other test results, and medications completed Discussed current clinical condition with patient and spouse and confirmed no current clinical concerns Assessed patient/ caregiver's baseline knowledge around self-health management of DM: good general baseline knowledge; could benefit from ongoing review of same Provided education to patient about basic DM disease process, long term complications of DM: confirmed patient understands of importance of routine foot care; reviewed podiatry visit last week: no concerns verbalized by patient/ spouse; reports diabetic shoes ordered; encouraged patient to continue to follow up with established vision provider about his chronic decreased vision Discussed patient's medications for DM: he and spouse report difficulty affording Jardiance- noted through review of EHR that [facility] team has been trying to contact patient: encouraged patient/ spouse to listen to their voice mail messages and return scheduler's call from yesterday: will make pharmacy team aware that I was able to connect with patient/ spouse today- encouraged their ongoing engagement with CCM pharmacy team Confirmed patient continues to monitor/ record blood sugars at home BID- fasting/ HS: reports fasting ranges consistently between 98-110; post-prandial HS values between 125-130 Requested that patient begin writing blood sugars at home down for review together during future calls- he is agreeable Discussed A1-C significance and reviewed recent historical trends of A1-C values with patient- patient/ spouse will require ongoing reinforcement of A1-C education Confirmed patient has been following low sugar/ low carb diet- positive reinforcement provided Reviewed scheduled/upcoming provider appointments including: Dr. Dwyane Dee, endocrinology provider on 01/11/21 Discussed plans with patient for ongoing care management follow up and provided patient with direct  contact information for care management team Self-Care Activities UNABLE to independently manage medications at home: Patient's spouse manages medications; they would like information on patient assistance resources for Jardiance Attends all scheduled provider appointments Checks blood sugars as instructed and calls care providers for concerns Adheres to prescribed ADA/carb modified Patient Goals: Continue checking your blood sugar twice a day: first thing in the morning before you eat, and again at night after a meal Please start writing your blood sugars down on paper so we can review these together during our future phone calls Check blood sugar extra during the day if you don't feel normal or feel it is too high or too low Please continue eating foods that are low in sugar and carbohydrates Take the blood sugars you write down on paper to all doctor visits Follow Up Plan:  Telephone follow up appointment with care management team member scheduled for: Thursday November 25, 2020 at 1:30 pm The patient has been provided with contact information for the care management team and has been advised to call with any health related questions or concerns.      Plan: Telephone follow up appointment with care management team member scheduled for:  Thursday November 25, 2020 at 1:30 pm The patient has been provided with contact information for the care management team and has been advised to call with any health related questions or concerns  [name], RN, BSN, [facility]  RN Care Coordination- [place] [phone]: direct office [phone]: mobile

## 2020-11-04 NOTE — Telephone Encounter (Signed)
-----   Message from Geralynn Rile, MD sent at 10/01/2020  7:46 AM EDT ----- Regarding: Set him up for PYP scan [facility]:  Can you set him up for an outpatient PYP scan?  Please call him and let him know that I recommended he complete this test for further work-up of his heart condition.  We will then get him in to see me once we have the results of that test.  [place] T. Audie Box, MD, [place]  [address], [place], [place] [address] [phone]  7:47 AM

## 2020-11-04 NOTE — Patient Instructions (Signed)
Visit Information   Corey [name] and Corey [name], it was nice talking with you today   Please read over the attached information, we will review this information during our future phone calls  As you know, I have made Dr. Quay Burow aware that you all have not heard from the [facility] that she ordered on October 15, 2020, so she may re-order this services for you.   I have also asked the Pharmacy team to contact you about your concerns with affording Jardiance: please listen out for the pharmacy team's call   I look forward to talking to you again for an update on Thursday November 25, 2020 at 1:30 pm- please be listening out for my call that day.  I will call as close to 1:30 pm as possible; I look forward to hearing about your progress.   Please don't hesitate to contact me if I can be of assistance to you before our next scheduled appointment.   Corey Rack, RN, BSN, [facility] RN Care Coordination- [place] [phone]: direct office [phone]: mobile    PATIENT GOALS:   Goals Addressed             This Visit's Progress    Monitor and Manage My Blood Sugar-Diabetes Type 2   On track    Timeframe:  Long-Range Goal Priority:  Low Start Date:    11/04/20                         Expected End Date:    11/04/21                   Follow Up Date 11/25/2020    Continue checking your blood sugar twice a day: first thing in the morning before you eat, and again at night after a meal Please start writing your blood sugars down on paper so we can review these together during our future phone calls Check blood sugar extra during the day if you don't feel normal or feel it is too high or too low Please continue eating foods that are low in sugar and carbohydrates Take the blood sugars you write down on paper to all doctor visits    Why is this important?   Checking your blood sugar at home helps to keep it from getting very high or very low.  Writing the results in a  diary or log helps the doctor know how to care for you.  Your blood sugar log should have the time, date and the results.  Also, write down the amount of insulin or other medicine that you take.  Other information, like what you ate, exercise done and how you were feeling, will also be helpful.           Track and Manage Fluids and Swelling-Heart Failure   On track    Timeframe:  Long-Range Goal Priority:  Medium Start Date:        11/04/20                     Expected End Date:    11/04/21                   Follow Up Date 11/25/2020   Continue to weigh yourself daily and write your weights at home down on paper or on the calendar- we will review these each time we have a telephone visit.  Today  you reported a weight of 157 lbs Call office if I gain more than 2 pounds in one day or 5 pounds in one week, especially if this is associated with chest tightness, swelling in your legs/ ankles, and new or worsened shortness of breath Watch for swelling in feet, ankles and legs every day Keep legs up while sitting Limit the salt in your diet: decrease your salt intake and use salt only in moderation Please read over the attached information- we will review this information periodically during our phone conversations  Why is this important?   It is important to check your weight daily and watch how much salt and liquids you have.  It will help you to manage your heart failure.            Heart Failure Action Plan A heart failure action plan helps you understand what to do when you have symptoms of heart failure. Your action plan is a color-coded plan that lists the symptoms to watch for and indicates what actions to take. If you have symptoms in the red zone, you need medical care right away. If you have symptoms in the yellow zone, you are having problems. If you have symptoms in the green zone, you are doing well. Follow the plan that was created by you and your health care provider.  Reviewyour plan each time you visit your health care provider. Red zone These signs and symptoms mean you should get medical help right away: You have trouble breathing when resting. You have a dry cough that is getting worse. You have swelling or pain in your legs or abdomen that is getting worse. You suddenly gain more than 2-3 lb (0.9-1.4 kg) in 24 hours, or more than 5 lb (2.3 kg) in a week. This amount may be more or less depending on your condition. You have trouble staying awake or you feel confused. You have chest pain. You do not have an appetite. You pass out. You have worsening sadness or depression. If you have any of these symptoms, call your local emergency services (911 in the [place] right away. Do not drive yourself to the hospital. Yellow zone These signs and symptoms mean your condition may be getting worse and you should make some changes: You have trouble breathing when you are active, or you need to sleep with your head raised on extra pillows to help you breathe. You have swelling in your legs or abdomen. You gain 2-3 lb (0.9-1.4 kg) in 24 hours, or 5 lb (2.3 kg) in a week. This amount may be more or less depending on your condition. You get tired easily. You have trouble sleeping. You have a dry cough. If you have any of these symptoms: Contact your health care provider within the next day. Your health care provider may adjust your medicines. Green zone These signs mean you are doing well and can continue what you are doing: You do not have shortness of breath. You have very little swelling or no new swelling. Your weight is stable (no gain or loss). You have a normal activity level. You do not have chest pain or any other new symptoms. Follow these instructions at home: Take over-the-counter and prescription medicines only as told by your health care provider. Weigh yourself daily. Your target weight is __________ lb (__________ kg). Call your health care  provider if you gain more than __________ lb (__________ kg) in 24 hours, or more than __________ lb (__________ kg) in a week.  Health care provider name: _____________________________________________________ Health care provider phone number: _____________________________________________________ Eat a heart-healthy diet. Work with a diet and nutrition specialist (dietitian) to create an eating plan that is best for you. Keep all follow-up visits. This is important. Where to find more information American Heart Association: www.heart.org Summary A heart failure action plan helps you understand what to do when you have symptoms of heart failure. Follow the action plan that was created by you and your health care provider. Get help right away if you have any symptoms in the red zone. This information is not intended to replace advice given to you by your health care provider. Make sure you discuss any questions you have with your healthcare provider. Document Revised: 12/15/2019 Document Reviewed: 12/15/2019 Elsevier Patient Education  2022 [employer].  Consent to CCM Services: [name] was given information about Chronic Care Management services today including:  CCM service includes personalized support from designated clinical staff supervised by his physician, including individualized plan of care and coordination with other care providers 24/7 contact phone numbers for assistance for urgent and routine care needs. Service will only be billed when office clinical staff spend 20 minutes or more in a month to coordinate care. Only one practitioner may furnish and bill the service in a calendar month. The patient may stop CCM services at any time (effective at the end of the month) by phone call to the office staff. The patient will be responsible for cost sharing (co-pay) of up to 20% of the service fee (after annual deductible is met).  Patient agreed to services and verbal consent  obtained.   The patient verbalized understanding of instructions, educational materials, and care plan provided today and agreed to receive a mailed copy of patient instructions, educational materials, and care plan.  Telephone follow up appointment with care management team member scheduled for:  Thursday November 25, 2020 at 1:30 pm The patient has been provided with contact information for the care management team and has been advised to call with any health related questions or concerns.   Corey Rack, RN, BSN, [facility] RN Care Coordination- [place] [phone]: direct office [phone]: mobile    CLINICAL CARE PLAN: Patient Care Plan: Heart Failure (Adult)     Problem Identified: Symptom Exacerbation (Heart Failure)   Priority: Medium     Long-Range Goal: Symptom Exacerbation Prevented or Minimized   Start Date: 11/04/2020  Expected End Date: 11/04/2021  This Visit's Progress: On track  Priority: Medium  Note:   Current Barriers:  Knowledge deficit related to basic heart failure pathophysiology and self care management Unable to independently manage medications: reports decreased vision due to glaucoma Limited health literacy, hard of hearing in (L) ear: wife participates in all of patent medical affairs and is very supportive/ helpful Case Manager Clinical Goal(s):  Over the next 12 months, patient will verbalize understanding of Heart Failure Action Plan and when to call doctor, as evidenced by patient/ caregiver reporting during CCM RN CM outreach of: Completion of daily weight monitoring/ recording (notifying MD of 3 lb weight gain over night or 5 lb in a week) Adhering to low salt diet Taking prescribed medications as prescribed Interventions:  Collaboration with [name], MD regarding development and update of comprehensive plan of care as evidenced by provider attestation and co-signature Inter-disciplinary care team  collaboration (see longitudinal plan of care Chart reviewed including relevant office notes, upcoming scheduled appointments, and lab results Discussed  current  clinical condition with patient/ caregiver and confirmed no current clinical concerns; confirmed patient's spouse manages medications for patient due to decreased vision; decline medication review today; informed them  that CCM Pharmacy team has been trying to contact them and encouraged them to re-engage with CCM Pharmacy team; patient and spouse report good adherence to medication regimen Reviewed recent hospitalization May 9-19, 2022 with patient and spouse: today, the deny current clinical concerns around patient's post-hospital discharge status; denies issues around breathing, LE swelling Reviewed recent PCP office visit with patient and spouse: they report they have not heard anything from home health PT post-recent PCP order on 10/15/20: will message PCP and request re-order of home health services Basic overview and discussion of pathophysiology of Heart Failure education initiated: patient and spouse could benefit from ongoing reinforcement of same Assessed need for readable accurate scales in home: patient has scales and is monitoring weights daily but does not understand why: rationale provided Provided verbal education around signs/ symptoms yellow CHF zone/ weight gain guidelines in setting of CHF along with corresponding action plan:  patient and spouse will require ongoing reinforcement around same Reviewed recent weights at home with patient and spouse: they report consistent ranges between 155-158 lbs; weight reported today: 157 lbs Confirmed patient is taking diuretic as prescribed: twice weekly; reviewed role of diuretics in prevention of fluid overload and management of heart failure while taking care to prevent dehydration and maintain fluid balance in setting of concurrent CKD Confirmed patient endorses "trying to" follow low  salt, heart healthy diet-- feels he is making good progress and getting used to diet: positive reinforcement provided Reviewed upcoming provider appointments with patient and confirmed that patient has plans to attend all as scheduled: November 24, 2020: cardiology provider office visit Self-Care Activities: Patient verbalizes understanding of plan to continue monitoring/ recording daily weights at home Spouse administers medications as prescribed, due to patient vision issues Attends all scheduled provider appointments Calls pharmacy for medication refills Performs ADL's/ iADL's independently Calls provider office for new concerns or questions Patient Goals: Continue to weigh yourself daily and write your weights at home down on paper or on the calendar- we will review these each time we have a telephone visit.  Today you reported a weight of 157 lbs Call office if I gain more than 2 pounds in one day or 5 pounds in one week, especially if this is associated with chest tightness, swelling in your legs/ ankles, and new or worsened shortness of breath Watch for swelling in feet, ankles and legs every day Keep legs up while sitting Limit the salt in your diet: decrease your salt intake and use salt only in moderation Please read over the attached information- we will review this information periodically during our phone conversations Follow Up Plan:  Telephone follow up appointment with care management team member scheduled for: Thursday, November 25, 2020 at 1:30 pm The patient has been provided with contact information for the care management team and has been advised to call with any health related questions or concerns.      Patient Care Plan: Diabetes Type 2 (Adult)     Problem Identified: Glycemic Management (Diabetes, Type 2)   Priority: Low     Long-Range Goal: Glycemic Management Optimized   Start Date: 11/04/2020  Expected End Date: 11/04/2021  This Visit's Progress: On track  Priority:  Low  Note:   Objective:  Lab Results  Component Value Date   HGBA1C 6.4 (H) 09/21/2020  Lab Results  Component Value Date   CREATININE 2.29 (H) 10/27/2020   CREATININE 2.67 (H) 10/08/2020   CREATININE 2.34 (H) 09/30/2020   Lab Results  Component Value Date   EGFR 42 (L) 09/15/2020   Current Barriers:  Knowledge Deficits related to basic Diabetes pathophysiology and self care/management: patient and spouse could benefit from ongoing reinforcement of established knowledge baseline Difficulty obtaining or cannot afford medications- reports difficult to afford Jardiance- will make CCM Pharmacy team aware of same Unable to self administer medications as prescribed- reports wife manages medications due to poor vision Case Manager Clinical Goal(s):  Over the next 12 months, patient will demonstrate improved adherence to prescribed treatment plan for diabetes self care/management as evidenced by patient and careiver reporting during CCM RN CM outreach of:  Daily monitoring and recording of CBG   Adherence to ADA/ carb modified diet  Adherence to prescribed medication regimen  Contacting provider for new or worsened symptoms or questions Interventions:  Collaboration with [name], MD regarding development and update of comprehensive plan of care as evidenced by provider attestation and co-signature Inter-disciplinary care team collaboration (see longitudinal plan of care) Review of patient status, including review of consultants reports, relevant laboratory and other test results, and medications completed Discussed current clinical condition with patient and spouse and confirmed no current clinical concerns Assessed patient/ caregiver's baseline knowledge around self-health management of DM: good general baseline knowledge; could benefit from ongoing review of same Provided education to patient about basic DM disease process, long term complications of DM: confirmed patient  understands of importance of routine foot care; reviewed podiatry visit last week: no concerns verbalized by patient/ spouse; reports diabetic shoes ordered; encouraged patient to continue to follow up with established vision provider about his chronic decreased vision Discussed patient's medications for DM: he and spouse report difficulty affording Jardiance- noted through review of EHR that [facility] team has been trying to contact patient: encouraged patient/ spouse to listen to their voice mail messages and return scheduler's call from yesterday: will make pharmacy team aware that I was able to connect with patient/ spouse today- encouraged their ongoing engagement with CCM pharmacy team Confirmed patient continues to monitor/ record blood sugars at home BID- fasting/ HS: reports fasting ranges consistently between 98-110; post-prandial HS values between 125-130 Requested that patient begin writing blood sugars at home down for review together during future calls- he is agreeable Discussed A1-C significance and reviewed recent historical trends of A1-C values with patient- patient/ spouse will require ongoing reinforcement of A1-C education Confirmed patient has been following low sugar/ low carb diet- positive reinforcement provided Reviewed scheduled/upcoming provider appointments including: Dr. Dwyane Dee, endocrinology provider on 01/11/21 Discussed plans with patient for ongoing care management follow up and provided patient with direct contact information for care management team Self-Care Activities UNABLE to independently manage medications at home: Patient's spouse manages medications; they would like information on patient assistance resources for Jardiance Attends all scheduled provider appointments Checks blood sugars as instructed and calls care providers for concerns Adheres to prescribed ADA/carb modified Patient Goals: Continue checking your blood sugar twice a day: first thing in the  morning before you eat, and again at night after a meal Please start writing your blood sugars down on paper so we can review these together during our future phone calls Check blood sugar extra during the day if you don't feel normal or feel it is too high or too low Please continue eating foods that are  low in sugar and carbohydrates Take the blood sugars you write down on paper to all doctor visits Follow Up Plan:  Telephone follow up appointment with care management team member scheduled for: Thursday November 25, 2020 at 1:30 pm The patient has been provided with contact information for the care management team and has been advised to call with any health related questions or concerns.

## 2020-11-05 NOTE — Progress Notes (Signed)
Chronic Care Management Pharmacy Assistant   Name: Corey [name]  MRN: [id number] DOB: Jul 07, 1945   Reason for Encounter: Disease State   Conditions to be addressed/monitored: HTN   Recent office visits:  07/30/20 Dr. Quay Burow 09/09/20 Dr. Dwyane Dee 10/15/20 Dr. Quay Burow Recent consult visits:  09/15/20 Dr. Marlou Porch Cardiology  10/14/20 Dr. Ronnald Nian visits:  Medication Reconciliation was completed by comparing discharge summary, patient's EMR and Pharmacy list, and upon discussion with patient.  Admitted to the hospital on 09/20/20 due to angina. Discharge date was 09/30/20. Discharged from [facility]?Medications Started at [facility] Discharge:?? -started None ID  Medication Changes at Hospital Discharge: -Changed None ID  Medications Discontinued at Hospital Discharge: -Stopped  amlodipine  Clonidine Furosemide Glimepiride Losartan Naproxen   Medications that remain the same after Hospital Discharge:??  -All other medications will remain the same.    Medications: Outpatient Encounter Medications as of 10/28/2020  Medication Sig   albuterol (VENTOLIN HFA) 108 (90 Base) MCG/ACT inhaler Inhale 2 puffs into the lungs every 4 (four) hours as needed for wheezing or shortness of breath.   allopurinol (ZYLOPRIM) 100 MG tablet Take 1/2 (one-half) tablet by mouth once daily   aspirin EC 81 MG tablet Take 1 tablet (81 mg total) by mouth daily. (Patient taking differently: Take 81 mg by mouth in the morning.)   bismuth subsalicylate (PEPTO BISMOL) 262 MG/15ML suspension Take 30 mLs by mouth every 6 (six) hours as needed for indigestion.   carvedilol (COREG) 6.25 MG tablet Take 1 tablet (6.25 mg total) by mouth 2 (two) times daily with a meal.   colchicine (COLCRYS) 0.6 MG tablet Take 2 tabs once and then one tab one hour later as needed for gout (Patient taking differently: Take 0.6-1.2 mg by mouth See admin instructions. Take 2 tabs once and then one tab one hour  later as needed for gout)   empagliflozin (JARDIANCE) 10 MG TABS tablet Take 1 tablet (10 mg total) by mouth daily before breakfast.   hydrALAZINE (APRESOLINE) 50 MG tablet Take 2 tablets (100 mg total) by mouth 3 (three) times daily.   metFORMIN (GLUCOPHAGE) 500 MG tablet Take 1 tablet (500 mg total) by mouth 2 (two) times daily with a meal. (Patient taking differently: Take 500 mg by mouth at bedtime.)   nitroGLYCERIN (NITROSTAT) 0.4 MG SL tablet Place 1 tablet (0.4 mg total) under the tongue every 5 (five) minutes as needed for chest pain.   rosuvastatin (CRESTOR) 40 MG tablet Take 1 tablet (40 mg total) by mouth daily.   torsemide (DEMADEX) 20 MG tablet Take 1 tablet (20 mg total) by mouth 2 (two) times a week. Mondays and Fridays   No facility-administered encounter medications on file as of 10/28/2020.    Pharmacist Review Reviewed chart prior to disease state call. Spoke with patient regarding BP  Recent Office Vitals: BP Readings from Last 3 Encounters:  10/27/20 124/66  10/15/20 128/60  10/14/20 131/71   Pulse Readings from Last 3 Encounters:  10/27/20 63  10/15/20 60  10/14/20 61    Wt Readings from Last 3 Encounters:  10/27/20 157 lb 9.6 oz (71.5 kg)  10/15/20 158 lb 12.8 oz (72 kg)  10/14/20 159 lb (72.1 kg)     [name] Function Lab Results  Component Value Date/Time   CREATININE 2.29 (H) 10/27/2020 10:47 AM   CREATININE 2.67 (H) 10/08/2020 11:36 AM   CREATININE 1.69 (H) 01/28/2020 10:00 AM   GFR 40.38 (  L) 09/06/2020 10:15 AM   GFRNONAA 30 (L) 10/27/2020 10:47 AM   GFRNONAA 40 (L) 01/28/2020 10:00 AM   GFRAA 46 (L) 01/28/2020 10:00 AM    BMP Latest Ref Rng & Units 10/27/2020 10/08/2020 09/30/2020  Glucose 70 - 99 mg/dL 98 131(H) 143(H)  BUN 8 - 23 mg/dL 50(H) 66(H) 46(H)  Creatinine 0.61 - 1.24 mg/dL 2.29(H) 2.67(H) 2.34(H)  BUN/Creat Ratio 10 - 24 - - -  Sodium 135 - 145 mmol/L 140 138 137  Potassium 3.5 - 5.1 mmol/L 4.1 4.5 4.7  Chloride 98 - 111 mmol/L 108  102 109  CO2 22 - 32 mmol/L 24 27 22   Calcium 8.9 - 10.3 mg/dL 10.1 10.3 9.1    Current antihypertensive regimen:  Carvedilol 6.25 mg 2 times daily Hydralazine 50 mg 3 times daily  How often are you checking your Blood Pressure? weekly Current home BP readings: Patient last blood pressure reading was 126/66 on 10/27/20  What recent interventions/DTPs have been made by any provider to improve Blood Pressure control since last CPP Visit: None ID  Any recent hospitalizations or ED visits since last visit with CPP? Yes  What diet changes have been made to improve Blood Pressure Control?  Patient wife states that the patient is on a salt free, no red meat diet  What exercise is being done to improve your Blood Pressure Control?  Patient wife states that she is trying to get him to walks some  Adherence Review: Is the patient currently on ACE/ARB medication? No Does the patient have >5 day gap between last estimated fill dates? No     Star Rating Drugs: Rosuvastatin 09/30/20 90 ds  [place] Pharmacist Assistant [phone]   Time spent:59

## 2020-11-09 DIAGNOSIS — G4733 Obstructive sleep apnea (adult) (pediatric): Secondary | ICD-10-CM | POA: Diagnosis not present

## 2020-11-15 ENCOUNTER — Other Ambulatory Visit: Payer: Self-pay | Admitting: Internal Medicine

## 2020-11-16 ENCOUNTER — Ambulatory Visit (INDEPENDENT_AMBULATORY_CARE_PROVIDER_SITE_OTHER): Payer: Medicare HMO | Admitting: Pharmacist

## 2020-11-16 ENCOUNTER — Other Ambulatory Visit: Payer: Self-pay

## 2020-11-16 DIAGNOSIS — M1A9XX0 Chronic gout, unspecified, without tophus (tophi): Secondary | ICD-10-CM

## 2020-11-16 DIAGNOSIS — E1165 Type 2 diabetes mellitus with hyperglycemia: Secondary | ICD-10-CM | POA: Diagnosis not present

## 2020-11-16 DIAGNOSIS — E1122 Type 2 diabetes mellitus with diabetic chronic kidney disease: Secondary | ICD-10-CM | POA: Diagnosis not present

## 2020-11-16 DIAGNOSIS — I25119 Atherosclerotic heart disease of native coronary artery with unspecified angina pectoris: Secondary | ICD-10-CM

## 2020-11-16 DIAGNOSIS — I10 Essential (primary) hypertension: Secondary | ICD-10-CM | POA: Diagnosis not present

## 2020-11-16 DIAGNOSIS — Z794 Long term (current) use of insulin: Secondary | ICD-10-CM | POA: Diagnosis not present

## 2020-11-16 DIAGNOSIS — I5040 Unspecified combined systolic (congestive) and diastolic (congestive) heart failure: Secondary | ICD-10-CM | POA: Diagnosis not present

## 2020-11-16 DIAGNOSIS — N1832 Chronic kidney disease, stage 3b: Secondary | ICD-10-CM

## 2020-11-16 NOTE — Progress Notes (Signed)
Chronic Care Management Pharmacy Note  11/18/2020 Name:  Corey [name] MRN:  [id number] DOB:  01-08-1946  Summary: -Pt endorses compliance with medication changes from May 2022 hospitalization (new dx of CHF and CAD) -Pt has been denied Jardiance PAP and LIS - he should qualify for PAP now that he has been denied LIS  Recommendations/Changes made from today's visit: -Advised patient to bring LIS denial letter to cardiology office to fax to [facility] Cares   Subjective: Corey [name] is an 75 y.o. year old male who is a primary patient of Burns, [name], MD.  The CCM team was consulted for assistance with disease management and care coordination needs.    Engaged with patient by telephone for follow up visit in response to provider referral for pharmacy case management and/or care coordination services.   Consent to Services:  The patient was given information about Chronic Care Management services, agreed to services, and gave verbal consent prior to initiation of services.  Please see initial visit note for detailed documentation.   Patient Care Team: [name], MD as PCP - General (Internal Medicine) Jerline Pain, MD as PCP - Cardiology (Cardiology) Charlton Haws, [facility] as Pharmacist (Pharmacist) Knox Royalty, RN as Case Manager  Recent office visits: 10/15/20 [name] OV: hospital f/u; referred to home health  Recent consult visits: 10/27/20 Dr Shan Levans ([facility] clinic): hospital f/u  10/18/20 LCSW - applied for LIS after Jardiance PAP denial.  10/14/20 Dr Orvan Seen (CT surgery): f/u pericardial window (pericardial effusion)  10/08/20 Dr Shan Levans ([place] clinic): lost addnl 10 lbs on torsemide 20 mg daily; given scale to weigh at home; reduce torsemide to twice weekly (Mon-Fri); continue carvedilol, avoid escalating further d/t hx bradycardia and amyloidosis; provided w/ Jardiance samples and started [facility] Cares application - faxed to Dr Marlou Porch. Faxed to [facility]  visits: Medication Reconciliation was completed by comparing discharge summary, patient's EMR and Pharmacy list, and upon discussion with patient.   Admitted to the hospital on 09/20/20 due to Progressive angina/CAD and acute CHF.Discharge date was 09/30/20. Discharged from [facility].    Symptoms improved with diuresis. Trial of ARNI aborted d/t AKI.    New?Medications Started at [facility] Discharge:?? -Carvedilol 6.25 mg BID -Jardiance 10 mg -Rosuvastatin -Nitroglycerin  Medication Changes at Hospital Discharge: -furosemide to torsemide (due to cardiac amyloidosis)   Medications Discontinued at Hospital Discharge: amlodipine Clonidine Furosemide Glimepiride Losartan Naproxen   Medications that remain the same after Hospital Discharge:?? -All other medications will remain the same.    Objective:  Lab Results  Component Value Date   CREATININE 2.29 (H) 10/27/2020   BUN 50 (H) 10/27/2020   GFR 40.38 (L) 09/06/2020   GFRNONAA 30 (L) 10/27/2020   GFRAA 46 (L) 01/28/2020   NA 140 10/27/2020   K 4.1 10/27/2020   CALCIUM 10.1 10/27/2020   CO2 24 10/27/2020   GLUCOSE 98 10/27/2020    Lab Results  Component Value Date/Time   HGBA1C 6.4 (H) 09/21/2020 03:36 AM   HGBA1C 6.4 09/06/2020 10:15 AM   FRUCTOSAMINE 287 (H) 03/01/2020 09:09 AM   FRUCTOSAMINE 320 (H) 11/26/2019 09:06 AM   GFR 40.38 (L) 09/06/2020 10:15 AM   GFR 35.58 (L) 06/02/2020 09:27 AM   MICROALBUR 2.4 (H) 03/03/2020 11:09 AM   MICROALBUR 2.5 (H) 12/20/2018 09:38 AM    Last diabetic Eye exam:  Lab Results  Component Value Date/Time   HMDIABEYEEXA No Retinopathy 05/27/2018 12:00 AM  Last diabetic Foot exam: No results found for: HMDIABFOOTEX   Lab Results  Component Value Date   CHOL 125 01/28/2020   HDL 50 01/28/2020   LDLCALC 61 01/28/2020   TRIG 55 01/28/2020   CHOLHDL 2.5 01/28/2020    Hepatic Function Latest Ref Rng & Units 09/23/2020 09/22/2020 09/21/2020  Total Protein 6.5 - 8.1  g/dL 6.2(L) 5.4(L) 5.6(L)  Albumin 3.5 - 5.0 g/dL 3.3(L) 3.0(L) 3.1(L)  AST 15 - 41 U/L 25 22 25   ALT 0 - 44 U/L 16 16 17   Alk Phosphatase 38 - 126 U/L 67 61 63  Total Bilirubin 0.3 - 1.2 mg/dL 1.3(H) 1.3(H) 0.7  Bilirubin, Direct 0.0 - 0.3 mg/dL - - -    Lab Results  Component Value Date/Time   TSH 1.520 09/27/2020 08:46 AM   TSH 0.243 (L) 06/06/2019 03:43 AM   TSH 1.13 01/23/2017 11:48 AM   TSH 1.06 12/03/2012 11:43 AM   FREET4 1.02 09/27/2020 08:46 AM   FREET4 0.82 06/06/2019 03:43 AM    CBC Latest Ref Rng & Units 09/29/2020 09/28/2020 09/27/2020  WBC 4.0 - 10.5 K/uL 8.2 9.8 5.1  Hemoglobin 13.0 - 17.0 g/dL 13.6 13.4 14.5  Hematocrit 39.0 - 52.0 % 44.0 42.4 45.2  Platelets 150 - 400 K/uL 218 223 196    No results found for: VD25OH  Clinical ASCVD: Yes  The ASCVD Risk score Mikey Bussing DC Jr., et al., 2013) failed to calculate for the following reasons:   The patient has a prior MI or stroke diagnosis    Depression screen [facility] 2/9 11/04/2020 07/30/2020 06/12/2019  Decreased Interest 0 0 0  Down, Depressed, Hopeless 0 0 0  PHQ - 2 Score 0 0 0  Some recent data might be hidden    Social History   Tobacco Use  Smoking Status Former   Packs/day: 0.25   Years: 1.00   Pack years: 0.25   Types: Cigarettes   Quit date: 05/15/1968   Years since quitting: 52.5  Smokeless Tobacco Never  Tobacco Comments   smoked [place], up to 1-2 cigarettes/ day   BP Readings from Last 3 Encounters:  10/27/20 124/66  10/15/20 128/60  10/14/20 131/71   Pulse Readings from Last 3 Encounters:  10/27/20 63  10/15/20 60  10/14/20 61   Wt Readings from Last 3 Encounters:  10/27/20 157 lb 9.6 oz (71.5 kg)  10/15/20 158 lb 12.8 oz (72 kg)  10/14/20 159 lb (72.1 kg)   BMI Readings from Last 3 Encounters:  10/27/20 25.44 kg/m  10/15/20 25.63 kg/m  10/14/20 25.66 kg/m    Assessment/Interventions: Review of patient past medical history, allergies, medications, health status, including  review of consultants reports, laboratory and other test data, was performed as part of comprehensive evaluation and provision of chronic care management services.   SDOH:  (Social Determinants of Health) assessments and interventions performed: Yes  SDOH Screenings   Alcohol Screen: Not on file  Depression (PHQ2-9): Low Risk    PHQ-2 Score: 0  Financial Resource Strain: Medium Risk   Difficulty of Paying Living Expenses: Somewhat hard  Food Insecurity: No Food Insecurity   Worried About Charity fundraiser in the Last Year: Never true   Ran Out of Food in the Last Year: Never true  Housing: Low Risk    Last Housing Risk Score: 0  Physical Activity: Not on file  Social Connections: Not on file  Stress: Not on file  Tobacco Use: Medium Risk  Smoking Tobacco Use: Former   Smokeless Tobacco Use: Never  Transportation Needs: No Data processing manager (Medical): No   Lack of Transportation (Non-Medical): No    CCM Care Plan  Allergies  Allergen Reactions   Hydrochlorothiazide Other (See Comments)    Gout , uncontrolled diabetes and renal insufficiency    Medications Reviewed Today     Reviewed by Charlton Haws, [facility] (Pharmacist) on 11/16/20 at 1638  Med List Status: <None>   Medication Order Taking? Sig Documenting Provider Last Dose Status Informant  albuterol (VENTOLIN HFA) 108 (90 Base) MCG/ACT inhaler [id number] Yes Inhale 2 puffs into the lungs every 4 (four) hours as needed for wheezing or shortness of breath. Janith Lima, MD Taking Active Self  allopurinol (ZYLOPRIM) 100 MG tablet [id number] Yes Take 1/2 (one-half) tablet by mouth once daily [name], MD Taking Active   aspirin EC 81 MG tablet [id number] Yes Take 1 tablet (81 mg total) by mouth daily.  Patient taking differently: Take 81 mg by mouth in the morning.   [name], MD Taking Active   bismuth subsalicylate (PEPTO BISMOL) 262 MG/15ML suspension [id number] Yes Take 30  mLs by mouth every 6 (six) hours as needed for indigestion. [provider] Taking Active Self  carvedilol (COREG) 6.25 MG tablet [id number] Yes Take 1 tablet (6.25 mg total) by mouth 2 (two) times daily with a meal. Burns, [name], MD Taking Active   colchicine (COLCRYS) 0.6 MG tablet [id number] Yes Take 2 tabs once and then one tab one hour later as needed for gout Quay Burow, [name], MD Taking Active   empagliflozin (JARDIANCE) 10 MG TABS tablet [id number] Yes Take 1 tablet (10 mg total) by mouth daily before breakfast. Tommye Standard, Lorelee Cover., PA-C Taking Active   hydrALAZINE (APRESOLINE) 50 MG tablet [id number] Yes Take 2 tablets (100 mg total) by mouth 3 (three) times daily. Kroeger, Lorelee Cover., PA-C Taking Active   isosorbide mononitrate (IMDUR) 60 MG 24 hr tablet [id number] Yes Take 60 mg by mouth in the morning and at bedtime. [provider] Taking Active   metFORMIN (GLUCOPHAGE) 500 MG tablet [id number] Yes Take 1 tablet (500 mg total) by mouth 2 (two) times daily with a meal. Elayne Snare, MD Taking Active   nitroGLYCERIN (NITROSTAT) 0.4 MG SL tablet [id number] Yes Place 1 tablet (0.4 mg total) under the tongue every 5 (five) minutes as needed for chest pain. Kroeger, Lorelee Cover., PA-C Taking Active   rosuvastatin (CRESTOR) 40 MG tablet [id number] Yes Take 1 tablet (40 mg total) by mouth daily. Kroeger, Lorelee Cover., PA-C Taking Active   torsemide (DEMADEX) 20 MG tablet [id number] Yes Take 1 tablet (20 mg total) by mouth 2 (two) times a week. Mondays and Fridays Katherine Roan, MD Taking Active             Patient Active Problem List   Diagnosis Date Noted   Physical deconditioning 10/16/2020   Cardiac amyloidosis ([place]) 09/30/2020   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   Progressive angina ([place]) - Atypical; DOE 09/20/2020   Coronary artery disease involving native coronary artery of native heart with angina pectoris ([place]) 09/20/2020   Acute on chronic combined systolic and  diastolic CHF (congestive heart failure) ([place]) 09/20/2020   Acute combined systolic and diastolic heart failure ([place]) 09/20/2020   Soft tissue mass 07/30/2020   Contracture of joint of finger of left hand 07/30/2020   Acute respiratory disease due to COVID-19 virus 06/05/2019  DOE (dyspnea on exertion) 03/01/2019   Mild cognitive impairment 12/05/2018   Slurred speech 07/23/2018   Muscle twitching 07/23/2018   OSA (obstructive sleep apnea) 03/27/2018   Snores 01/21/2018   Poor balance 01/23/2017   Ankle swelling, left 09/06/2016   Bradycardia 01/28/2016   Weak urinary stream 12/31/2015   Stage 3b chronic kidney disease 09/15/2015   Ventral hernia without obstruction or gangrene 09/15/2015   Prostate cancer ([place]) 07/25/2015   Erectile dysfunction 05/04/2015   Urinary frequency 05/04/2015   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   Abnormal finding on MRI of brain 02/14/2015   PEA (Pulseless electrical activity) ([place]) 12/03/2012   Diabetes ([place]) 06/30/2011   Gout 03/05/2007   Essential hypertension 03/05/2007    Immunization History  Administered Date(s) Administered   Fluad Quad(high Dose 65+) 03/05/2019, 01/28/2020   Influenza Split 03/02/2011   Influenza Whole 01/23/2013   Influenza, High Dose Seasonal PF 01/28/2016, 01/23/2017, 01/21/2018   Influenza,inj,Quad PF,6+ Mos 02/23/2014, 02/24/2015   PFIZER(Purple Top)SARS-COV-2 Vaccination 07/08/2019, 07/29/2019, 02/07/2020   Pneumococcal Conjugate-13 05/04/2015   Pneumococcal Polysaccharide-23 10/02/2012, 01/21/2018   Tdap 12/15/2010, 09/21/2011    Conditions to be addressed/monitored:  Hypertension, Hyperlipidemia, Diabetes, Heart Failure, Coronary Artery Disease, Chronic Kidney Disease, and Gout  Care Plan : [place]  Updates made by Charlton Haws, [place] since 11/18/2020 12:00 AM     Problem: Hypertension, Hyperlipidemia, Diabetes, Heart Failure, Coronary Artery Disease, Chronic  Kidney Disease, and Gout   Priority: High     Long-Range Goal: Disease management   Start Date: 11/16/2020  Expected End Date: 05/21/2021  This Visit's Progress: On track  Priority: High  Note:   Current Barriers:  Unable to independently monitor therapeutic efficacy  Pharmacist Clinical Goal(s):  Patient will achieve adherence to monitoring guidelines and medication adherence to achieve therapeutic efficacy through collaboration with PharmD and provider.   Interventions: 1:1 collaboration with [name], MD regarding development and update of comprehensive plan of care as evidenced by provider attestation and co-signature Inter-disciplinary care team collaboration (see longitudinal plan of care) Comprehensive medication review performed; medication list updated in electronic medical record   Hypertension / Heart Failure    BP goal is:  <130/80 Patient checks BP at home daily Patient home BP readings are ranging: 140/78 153 lb   Patient has failed these meds in the past: n/a Patient is currently controlled on the following medications: Hydralazine 50 mg - 2 tab TID Carvedilol 6.25 mg BID Torsemide 20 mg twice a week (Mon, Fri) Isosorbide MN 60 mg BID Jardiance 10 mg daily   We discussed: medication changes since hospitalization and CHF/CAD diagnosis; pt endorses compliance with medications as above; he reports some excess swelling in mid-week and wonders if 3x weekly torsemide would work better; pt has upcoming appt with cardiology in 1 week, advised to discuss then. -Pt is also working on patient assistance for [employer]; he was initially denied PAP due to possibly qualifying for LIS; pt's wife reports they applied for LIS and she received a denial letter; advised pt to bring denial letter to cardiology visit and ask them to fax to patient assistance again, they should qualify now that they have been denied LIS  Plan: Recommend to continue current medication;  Pt to discuss  torsemide dosing with cardiology next week Pt to bring LIS denial letter to cardiology office to fax to [place]   Hyperlipidemia / CAD    LDL goal < 70 Hx stroke 2011;  CAD 09/2020   Patient has failed these meds in past:  Patient is currently controlled on the following medications: Aspirin 81 mg daily Rosuvastatin 40 mg daily Nitroglycerin 0.4 mg SL prn   We discussed:  pt reports compliance with rosuvastatin, started in hospital after anginal episode 09/2020. Repeat lipid panel is due.   Plan: Continue control with diet and exercise Lipid panel at next office visit   Diabetes    A1c goal <7% Checking BG: 2x per Day Recent FBG Readings: 90-110; 72  Recent HS BG readings:    Patient has failed these meds in past: glimepiride, pioglitazone Patient is currently controlled on the following medications: Metformin 500 mg BID  Jardiance 10 mg daily  We discussed: medication changes since May 2022 hospitalization; pt endorses compliance with medications above, he has stopped glimepiride and is taking Jardiance   Plan: Continue current medications and control with diet and exercise    Gout    Patient has failed these meds in past: n/a Patient is currently uncontrolled on the following medications: Colchicine 0.6 mg PRN Allopurinol 50 mg daily (100 mg 1/2 tab)   We discussed:  Pt reports compliance with medication as above; he denies recent gout flares; no recent uric acid on file since starting allopurinol   Plan:  Continue current medications  Uric acid at next office visit  Patient Goals/Self-Care Activities Patient will:  - take medications as prescribed focus on medication adherence by pill box check glucose daily, document, and provide at future appointments check blood pressure daily, document, and provide at future appointments weigh daily, and contact provider if weight gain of 2+ lbs overnight or 5+ lbs in a week engage in dietary modifications by reducing  salt -Bring LIS denial letter to cardiology office to fax to [place] [facility])      Medication Assistance:  Jardiance - PAP initially denied due to qualifying for LIS, however pt received LIS denial letter. Pt will bring letter to cardiology to fax to [facility].  Compliance/Adherence/Medication fill history: Care Gaps: Shingrix Eye exam (due 05/28/19) Covid booster (due 05/08/20)  Star-Rating Drugs: Metformin - LF 08/24/20 x 90 ds Rosuvastatin - LF 09/30/20 x 90 ds Jardiance - LF 09/30/20  Patient's preferred pharmacy is:  [facility] ([facility]), [place] - [address] [place] ([place]) [place] [address] Phone: [phone] Fax: [phone]  [facility] - [place], [place] [address] Phone: [phone] Fax: [phone]  [facility] [address] [place] [address] Phone: [phone] Fax: [phone]  Uses pill box? Yes Pt endorses 100% compliance  We discussed: Current pharmacy is preferred with insurance plan and patient is satisfied with pharmacy services Patient decided to: Continue current medication management strategy  Care Plan and Follow Up Patient Decision:  Patient agrees to Care Plan and Follow-up.  Plan: Telephone follow up appointment with care management team member scheduled for:  3 months  Charlene Brooke, PharmD, [place], CPP Clinical Pharmacist [facility] at [facility] [phone]

## 2020-11-18 NOTE — Patient Instructions (Signed)
Visit Information  Phone number for Pharmacist: [phone]   Goals Addressed             This Visit's Progress    Manage My Medicine       Timeframe:  Long-Range Goal Priority:  High Start Date:      11/16/20                       Expected End Date: 05/19/20                       Follow Up Date Oct 2022   - call for medicine refill 2 or 3 days before it runs out - call if I am sick and can't take my medicine - keep a list of all the medicines I take; vitamins and herbals too - use a pillbox to sort medicine - Bring LIS denial letter to cardiology office to fax to [employer] assistance program    Why is this important?   These steps will help you keep on track with your medicines.   Notes:          Patient verbalizes understanding of instructions provided today and agrees to view in [place].  Telephone follow up appointment with pharmacy team member scheduled for: 3 months  Charlene Brooke, PharmD, [place], CPP Clinical Pharmacist [facility] at [facility] [phone]

## 2020-11-22 ENCOUNTER — Telehealth: Payer: Self-pay | Admitting: Podiatry

## 2020-11-22 NOTE — Telephone Encounter (Signed)
Pt called checking status of diabetic shoes.  Upon checking Dr Dwyane Dee noted denied pt needs confirmation of exam.   I did ask pt to let me know when he is scheduled and I can refax paperwork to Dr Dwyane Dee.

## 2020-11-23 ENCOUNTER — Other Ambulatory Visit: Payer: Self-pay

## 2020-11-23 ENCOUNTER — Other Ambulatory Visit (INDEPENDENT_AMBULATORY_CARE_PROVIDER_SITE_OTHER): Payer: Medicare HMO

## 2020-11-23 DIAGNOSIS — E119 Type 2 diabetes mellitus without complications: Secondary | ICD-10-CM | POA: Diagnosis not present

## 2020-11-23 LAB — LIPID PANEL
Cholesterol: 88 mg/dL (ref 0–200)
HDL: 41.9 mg/dL (ref >39.00)
LDL Cholesterol: 34 mg/dL (ref 0–99)
NonHDL: 46.36
Total CHOL/HDL Ratio: 2
Triglycerides: 64 mg/dL (ref 0.0–149.0)
VLDL: 12.8 mg/dL (ref 0.0–40.0)

## 2020-11-23 LAB — COMPREHENSIVE METABOLIC PANEL
ALT: 20 U/L (ref 0–53)
AST: 35 U/L (ref 0–37)
Albumin: 4.1 g/dL (ref 3.5–5.2)
Alkaline Phosphatase: 64 U/L (ref 39–117)
BUN: 39 mg/dL — ABNORMAL HIGH (ref 6–23)
CO2: 29 mEq/L (ref 19–32)
Calcium: 10.6 mg/dL — ABNORMAL HIGH (ref 8.4–10.5)
Chloride: 106 mEq/L (ref 96–112)
Creatinine, Ser: 2.08 mg/dL — ABNORMAL HIGH (ref 0.40–1.50)
GFR: 31.21 mL/min — ABNORMAL LOW (ref >60.00)
Glucose, Bld: 85 mg/dL (ref 70–99)
Potassium: 4.3 mEq/L (ref 3.5–5.1)
Sodium: 142 mEq/L (ref 135–145)
Total Bilirubin: 0.6 mg/dL (ref 0.2–1.2)
Total Protein: 7.1 g/dL (ref 6.0–8.3)

## 2020-11-23 LAB — HEMOGLOBIN A1C: Hgb A1c MFr Bld: 7.2 % — ABNORMAL HIGH (ref 4.6–6.5)

## 2020-11-23 LAB — MICROALBUMIN / CREATININE URINE RATIO
Creatinine,U: 83.3 mg/dL
Microalb Creat Ratio: 10.2 mg/g (ref 0.0–30.0)
Microalb, Ur: 8.5 mg/dL — ABNORMAL HIGH (ref 0.0–1.9)

## 2020-11-24 ENCOUNTER — Encounter: Payer: Self-pay | Admitting: Cardiology

## 2020-11-24 ENCOUNTER — Other Ambulatory Visit: Payer: Self-pay | Admitting: Internal Medicine

## 2020-11-24 ENCOUNTER — Ambulatory Visit: Payer: Medicare HMO | Admitting: Cardiology

## 2020-11-24 VITALS — BP 122/70 | HR 57 | Ht 66.0 in | Wt 158.0 lb

## 2020-11-24 DIAGNOSIS — I5042 Chronic combined systolic (congestive) and diastolic (congestive) heart failure: Secondary | ICD-10-CM

## 2020-11-24 DIAGNOSIS — N1832 Chronic kidney disease, stage 3b: Secondary | ICD-10-CM | POA: Diagnosis not present

## 2020-11-24 DIAGNOSIS — I313 Pericardial effusion (noninflammatory): Secondary | ICD-10-CM

## 2020-11-24 DIAGNOSIS — I3139 Other pericardial effusion (noninflammatory): Secondary | ICD-10-CM

## 2020-11-24 NOTE — Patient Instructions (Signed)
Medication Instructions:  The current medical regimen is effective;  continue present plan and medications.  *If you need a refill on your cardiac medications before your next appointment, please call your pharmacy*  Follow-Up: At [facility], you and your health needs are our priority.  As part of our continuing mission to provide you with exceptional heart care, we have created designated Provider Care Teams.  These Care Teams include your primary Cardiologist (physician) and Advanced Practice Providers (APPs -  Physician Assistants and Nurse Practitioners) who all work together to provide you with the care you need, when you need it.  We recommend signing up for the patient portal called "MyChart".  Sign up information is provided on this After Visit Summary.  MyChart is used to connect with patients for Virtual Visits (Telemedicine).  Patients are able to view lab/test results, encounter notes, upcoming appointments, etc.  Non-urgent messages can be sent to your provider as well.   To learn more about what you can do with MyChart, go to [email or web address].    Your next appointment:   4 month(s)  The format for your next appointment:   In Person  Provider:   NP/PA or Candee Furbish, MD  Thank you for choosing [facility]!!

## 2020-11-24 NOTE — Progress Notes (Signed)
Cardiology Office Note:    Date:  11/24/2020   ID:  PIERRE DELLAROCCO, DOB 12/14/1945, MRN [id number]  PCP:  [name], MD   [facility] Providers Cardiologist:  Candee Furbish, MD     Referring MD: [name], MD    History of Present Illness:    Corey [name] is a 75 y.o. male here for follow-up of chronic pericardial effusion status post window, amyloidosis CAD combined systolic and diastolic heart failure with prior PEA arrest hypertension hyperlipidemia diabetes type 2 stroke obstructive sleep apnea cognitive impairment and prostate cancer.  Back in May 2022 was having dyspnea on exertion and possible anginal symptoms.  He scheduled for a left and right heart cath.  During that admission he was found to have an 80% occlusion of his LAD with severely elevated left ventricular end-diastolic pressures.  Symptoms were probably more compatible with pressure volume overload and he was started on diuretics.  His echocardiogram on 09/2020 showed EF of 35 to 40%.  Had a large pericardial effusion at the time.  He underwent a pericardial window and biopsies were taken that were compatible with amyloidosis possible AL amyloid low doses.  However, serum and urine studies were not compatible with AL amyloidosis.  Family history showed 2 brothers with heart failure 1 deceased 1 sister died from heart failure.  He has a son that is 45 without any issues.  He lost a significant amount of weight with diuresis.  His torsemide was decreased.  Weight has been steady at 156 pounds at last office visit with heart failure team.  PYP scan was delayed because of radiotracer shortage.  Past Medical History:  Diagnosis Date   Cataract    Chronic heart failure with preserved ejection fraction (HFpEF) ([place])    Colon polyp 2003   Dr [name]; F/U was to be 2008( not completed)   CVA (cerebral infarction) 2014   Diabetes mellitus    Diverticulosis 2003   Dyspnea    with exertion   Gout    Hypertension     Myocardial infarction ([place]) 2014   ?? PEA cardiac arrest   OSA (obstructive sleep apnea) 03/27/2018   Pneumonia 09/29/11   Avelox X 10 days as OP   Prostate cancer ([place])    Renal insufficiency    Seizures ([place])    not treated for seizure disorder; had a seizure after stroke 2014; no seizure since then    Past Surgical History:  Procedure Laterality Date   COLONOSCOPY W/ POLYPECTOMY  2003   no F/U ([place] discussed 12/03/12)   INSERTION OF MESH N/A 07/16/2017   Procedure: INSERTION OF MESH;  Surgeon: [name], MD;  Location: [facility];  Service: General;  Laterality: N/A;   PEG PLACEMENT  2014   PEG TUBE REMOVAL  2014   PROSTATE BIOPSY     RIGHT/LEFT HEART CATH AND CORONARY ANGIOGRAPHY N/A 09/20/2020   Procedure: RIGHT/LEFT HEART CATH AND CORONARY ANGIOGRAPHY;  Surgeon: Leonie Man, MD;  Location: [facility];  Service: Cardiovascular;  Laterality: N/A;   TEE WITHOUT CARDIOVERSION N/A 09/27/2020   Procedure: TRANSESOPHAGEAL ECHOCARDIOGRAM (TEE);  Surgeon: Wonda Olds, MD;  Location: [facility];  Service: Thoracic;  Laterality: N/A;   TRACHEOSTOMY  2014   was closed after hospital stay   [place] N/A 07/16/2017   Procedure: [place];  Surgeon: [name], MD;  Location: [facility];  Service: General;  Laterality:  N/A;   wrist aspiration  02/16/12    monosodium urate crystals; Dr Caralyn Guile    Current Medications: Current Meds  Medication Sig   albuterol (VENTOLIN HFA) 108 (90 Base) MCG/ACT inhaler Inhale 2 puffs into the lungs every 4 (four) hours as needed for wheezing or shortness of breath.   allopurinol (ZYLOPRIM) 100 MG tablet Take 1/2 (one-half) tablet by mouth once daily   aspirin EC 81 MG tablet Take 1 tablet (81 mg total) by mouth daily.   bismuth subsalicylate (PEPTO BISMOL) 262 MG/15ML suspension Take 30 mLs by mouth every 6 (six) hours as needed for indigestion.   carvedilol (COREG) 6.25 MG tablet Take 1 tablet  (6.25 mg total) by mouth 2 (two) times daily with a meal.   colchicine (COLCRYS) 0.6 MG tablet Take 2 tabs once and then one tab one hour later as needed for gout   empagliflozin (JARDIANCE) 10 MG TABS tablet Take 1 tablet (10 mg total) by mouth daily before breakfast.   hydrALAZINE (APRESOLINE) 50 MG tablet Take 2 tablets (100 mg total) by mouth 3 (three) times daily.   isosorbide mononitrate (IMDUR) 60 MG 24 hr tablet Take 60 mg by mouth in the morning and at bedtime.   metFORMIN (GLUCOPHAGE) 500 MG tablet Take 1 tablet (500 mg total) by mouth 2 (two) times daily with a meal.   nitroGLYCERIN (NITROSTAT) 0.4 MG SL tablet Place 1 tablet (0.4 mg total) under the tongue every 5 (five) minutes as needed for chest pain.   rosuvastatin (CRESTOR) 40 MG tablet Take 1 tablet (40 mg total) by mouth daily.   torsemide (DEMADEX) 20 MG tablet Take 1 tablet (20 mg total) by mouth 2 (two) times a week. Mondays and Fridays     Allergies:   Hydrochlorothiazide   Social History   Socioeconomic History   Marital status: Married    Spouse name: [name]   Number of children: 2   Years of education: 12   Highest education level: 12th grade  Occupational History   Not on file  Tobacco Use   Smoking status: Former    Packs/day: 0.25    Years: 1.00    Pack years: 0.25    Types: Cigarettes    Quit date: 05/15/1968    Years since quitting: 52.5   Smokeless tobacco: Never   Tobacco comments:    smoked Alcorn, up to 1-2 cigarettes/ day  Vaping Use   Vaping Use: Never used  Substance and Sexual Activity   Alcohol use: No    Alcohol/week: 0.0 standard drinks   Drug use: No   Sexual activity: Yes  Other Topics Concern   Not on file  Social History Narrative   Walks three times a week, 2-3 miles, occasional walks on treadmill       Patient is right-handed. He lives with his wife in a one level home. He drinks 1 diet Mt. Dew a day and rarely has coffee. He walks 3 x a week for exercise.   Social  Determinants of Health   Financial Resource Strain: Medium Risk   Difficulty of Paying Living Expenses: Somewhat hard  Food Insecurity: No Food Insecurity   Worried About Charity fundraiser in the Last Year: Never true   Ran Out of Food in the Last Year: Never true  Transportation Needs: No Transportation Needs   Lack of Transportation (Medical): No   Lack of Transportation (Non-Medical): No  Physical Activity: Not on file  Stress: Not on file  Social Connections: Not on file     Family History: The patient's family history includes Cancer in his father; Diabetes in his father and mother; Hypertension in his father and mother; Prostate cancer in his father. There is no history of Stroke, Heart disease, or Colon cancer.  ROS:   Please see the history of present illness.     All other systems reviewed and are negative.  EKGs/Labs/Other Studies Reviewed:    Recent Labs: 05/06/2020: B Natriuretic Peptide 1,524.0 09/27/2020: TSH 1.520 09/29/2020: Hemoglobin 13.6; Platelets 218 11/23/2020: ALT 20; BUN 39; Creatinine, Ser 2.08; Potassium 4.3; Sodium 142  Recent Lipid Panel    Component Value Date/Time   CHOL 88 11/23/2020 1056   TRIG 64.0 11/23/2020 1056   HDL 41.90 11/23/2020 1056   CHOLHDL 2 11/23/2020 1056   VLDL 12.8 11/23/2020 1056   LDLCALC 34 11/23/2020 1056   LDLCALC 61 01/28/2020 1000     Risk Assessment/Calculations:          Physical Exam:    VS:  BP 122/70   Pulse (!) 57   Ht 5\' 6"  (1.676 m)   Wt 158 lb (71.7 kg)   SpO2 98%   BMI 25.50 kg/m     Wt Readings from Last 3 Encounters:  11/24/20 158 lb (71.7 kg)  10/27/20 157 lb 9.6 oz (71.5 kg)  10/15/20 158 lb 12.8 oz (72 kg)     GEN:  Well nourished, well developed in no acute distress HEENT: Normal NECK: No JVD; No carotid bruits LYMPHATICS: No lymphadenopathy CARDIAC: RRR, no murmurs, rubs, gallops RESPIRATORY:  Clear to auscultation without rales, wheezing or rhonchi  ABDOMEN: Soft, non-tender,  non-distended MUSCULOSKELETAL:  No edema; No deformity  SKIN: Warm and dry NEUROLOGIC:  Alert and oriented x 3 PSYCHIATRIC:  Normal affect   ASSESSMENT:    1. Chronic combined systolic and diastolic congestive heart failure (HCC)   2. Stage 3b chronic kidney disease   3. Pericardial effusion    PLAN:    In order of problems listed above:  Chronic systolic heart failure - Blood pressure stable.  Continue with carvedilol medical management 6.25 mg twice a day, Jardiance 10 mg a day, hydralazine 100 mg 3 times a day, isosorbide 60 mg a day.  He is taking Demadex or torsemide 20 mg 2 times a week on Mondays and Fridays.  He has been maintaining his weight of approximately 156 to 158 pounds.  He understands to take an extra torsemide if weight increases.  He is not on Entresto or ACE inhibitor because of creatinine previously 2.67.  Most recently 2.08.  Feels well.  Hyperlipidemia - Continue with Crestor 40 mg once a day-LDL 61 previously.  ALT 16.  Coronary artery disease - Continue with aspirin, high intensity statin.  Stable disease in mid LAD 80% stenosis.  Feels better without any anginal symptoms.  Pericardial effusion status post pericardial window - Stable.  Echo showed no residual effusion.  There was concerns about amyloidosis from biopsy.  Serum studies however did not compete with amyloidosis.  He is awaiting a PYP scan.  They were had a shortage of pyrophosphate.  They will let him know when this is available.  83-month follow-up with APP.       Medication Adjustments/Labs and Tests Ordered: Current medicines are reviewed at length with the patient today.  Concerns regarding medicines are outlined above.  No orders of the defined types were placed in this encounter.  No orders of the defined  types were placed in this encounter.   Patient Instructions  Medication Instructions:  The current medical regimen is effective;  continue present plan and medications.  *If  you need a refill on your cardiac medications before your next appointment, please call your pharmacy*  Follow-Up: At [facility], you and your health needs are our priority.  As part of our continuing mission to provide you with exceptional heart care, we have created designated Provider Care Teams.  These Care Teams include your primary Cardiologist (physician) and Advanced Practice Providers (APPs -  Physician Assistants and Nurse Practitioners) who all work together to provide you with the care you need, when you need it.  We recommend signing up for the patient portal called "MyChart".  Sign up information is provided on this After Visit Summary.  MyChart is used to connect with patients for Virtual Visits (Telemedicine).  Patients are able to view lab/test results, encounter notes, upcoming appointments, etc.  Non-urgent messages can be sent to your provider as well.   To learn more about what you can do with MyChart, go to [email or web address].    Your next appointment:   4 month(s)  The format for your next appointment:   In Person  Provider:   NP/PA or Candee Furbish, MD  Thank you for choosing [facility]!!     Signed, Candee Furbish, MD  11/24/2020 4:04 PM    [place]

## 2020-11-25 ENCOUNTER — Ambulatory Visit: Payer: Medicare HMO | Admitting: *Deleted

## 2020-11-25 DIAGNOSIS — E1122 Type 2 diabetes mellitus with diabetic chronic kidney disease: Secondary | ICD-10-CM

## 2020-11-25 DIAGNOSIS — I5040 Unspecified combined systolic (congestive) and diastolic (congestive) heart failure: Secondary | ICD-10-CM

## 2020-11-25 NOTE — Patient Instructions (Signed)
Visit Information  it was nice talking with you today.   Please read over the attached information, and keep up the great work monitoring your blood sugars and daily weights at home and following a low sugar/ carbohydrate, and low salt diet  As we discussed today, I made the care Management Pharmacist aware that you will soon be out of Jardiance, so she can follow up with you about your application for patient assistance   I look forward to talking to you again for an update on Wednesday January 19, 2021 at 1:00 pm- please be listening out for my call that day.  I will call as close to 1:00 pm as possible; I look forward to hearing about your progress.   Please don't hesitate to contact me if I can be of assistance to you before our next scheduled appointment.   [name], RN, BSN, [facility] RN Care Coordination- [place] [phone]: direct office [phone]: mobile    PATIENT GOALS:  Goals Addressed             This Visit's Progress    Monitor and Manage My Blood Sugar-Diabetes Type 2   On track    Timeframe:  Long-Range Goal Priority:  Low Start Date:    11/04/20                         Expected End Date:    11/04/21                   Follow Up Date 01/19/2021    Continue checking your blood sugar twice a day: first thing in the morning before you eat, and again at night after a meal Please continue writing your blood sugars down on paper so we can review these together during our future phone calls Check blood sugar extra during the day if you don't feel normal or feel it is too high or too low Today, you reported a fasting blood sugar of 102 Please continue eating foods that are low in sugar and carbohydrates Take the blood sugars you write down on paper to all doctor visits    Why is this important?   Checking your blood sugar at home helps to keep it from getting very high or very low.  Writing the results in a diary or log  helps the doctor know how to care for you.  Your blood sugar log should have the time, date and the results.  Also, write down the amount of insulin or other medicine that you take.  Other information, like what you ate, exercise done and how you were feeling, will also be helpful.          Track and Manage Fluids and Swelling-Heart Failure   On track    Timeframe:  Long-Range Goal Priority:  Medium Start Date:        11/04/20                     Expected End Date:    11/04/21                   Follow Up Date 01/19/2021   Great job weighing yourself daily and write your weights at home down on paper or on the calendar- we will review these each time we have a telephone visit.  Today you reported a weight of 156 lbs Call office if I  gain more than 2 pounds in one day or 5 pounds in one week, especially if this is associated with chest tightness, swelling in your legs/ ankles, and new or worsened shortness of breath Watch for swelling in feet, ankles and legs every day: follow the plan your cardiologist developed to increase your diuretic to 3 times a week if you begin to gain weight or have inccreased swelling in your legs and ankles Keep legs up while sitting Keep limiting the salt in your diet: decrease your salt intake and use salt only in moderation Please read over the attached information- we will review this information periodically during our phone conversations  Why is this important?   It is important to check your weight daily and watch how much salt and liquids you have.  It will help you to manage your heart failure.           Heart Failure Action Plan A heart failure action plan helps you understand what to do when you have symptoms of heart failure. Your action plan is a color-coded plan that lists the symptoms to watch for and indicates what actions to take. If you have symptoms in the red zone, you need medical care right away. If you have symptoms in the yellow zone,  you are having problems. If you have symptoms in the green zone, you are doing well. Follow the plan that was created by you and your health care provider. Reviewyour plan each time you visit your health care provider. Red zone These signs and symptoms mean you should get medical help right away: You have trouble breathing when resting. You have a dry cough that is getting worse. You have swelling or pain in your legs or abdomen that is getting worse. You suddenly gain more than 2-3 lb (0.9-1.4 kg) in 24 hours, or more than 5 lb (2.3 kg) in a week. This amount may be more or less depending on your condition. You have trouble staying awake or you feel confused. You have chest pain. You do not have an appetite. You pass out. You have worsening sadness or depression. If you have any of these symptoms, call your local emergency services (911 in the [place] right away. Do not drive yourself to the hospital. Yellow zone These signs and symptoms mean your condition may be getting worse and you should make some changes: You have trouble breathing when you are active, or you need to sleep with your head raised on extra pillows to help you breathe. You have swelling in your legs or abdomen. You gain 2-3 lb (0.9-1.4 kg) in 24 hours, or 5 lb (2.3 kg) in a week. This amount may be more or less depending on your condition. You get tired easily. You have trouble sleeping. You have a dry cough. If you have any of these symptoms: Contact your health care provider within the next day. Your health care provider may adjust your medicines. Green zone These signs mean you are doing well and can continue what you are doing: You do not have shortness of breath. You have very little swelling or no new swelling. Your weight is stable (no gain or loss). You have a normal activity level. You do not have chest pain or any other new symptoms. Follow these instructions at home: Take over-the-counter and  prescription medicines only as told by your health care provider. Weigh yourself daily. Your target weight is __________ lb (__________ kg). Call your health care provider if you gain  more than __________ lb (__________ kg) in 24 hours, or more than __________ lb (__________ kg) in a week. Health care provider name: _____________________________________________________ Health care provider phone number: _____________________________________________________ Eat a heart-healthy diet. Work with a diet and nutrition specialist (dietitian) to create an eating plan that is best for you. Keep all follow-up visits. This is important. Where to find more information American Heart Association: www.heart.org Summary A heart failure action plan helps you understand what to do when you have symptoms of heart failure. Follow the action plan that was created by you and your health care provider. Get help right away if you have any symptoms in the red zone. This information is not intended to replace advice given to you by your health care provider. Make sure you discuss any questions you have with your healthcare provider. Document Revised: 12/15/2019 Document Reviewed: 12/15/2019 Elsevier Patient Education  2022 [employer].  The patient verbalized understanding of instructions, educational materials, and care plan provided today and agreed to receive a mailed copy of patient instructions, educational materials, and care plan.  Telephone follow up appointment with care management team member scheduled for:  Wednesday January 19, 2021 at 1:00 pm The patient has been provided with contact information for the care management team and has been advised to call with any health related questions or concerns.   [name], RN, BSN, [facility] RN Care Coordination- [place] [phone]: direct office [phone]: mobile

## 2020-11-25 NOTE — Chronic Care Management (AMB) (Signed)
Chronic Care Management   CCM RN Visit Note  11/25/2020 Name: Corey [name] MRN: [id number] DOB: [birth year]  Subjective: Corey [name] is a 75 y.o. year old male who is a primary care patient of Burns, [name], MD. The care management team was consulted for assistance with disease management and care coordination needs.    Engaged with patient by telephone for follow up visit in response to provider referral for case management and/or care coordination services.   Consent to Services:  The patient was given information about Chronic Care Management services, agreed to services, and gave verbal consent prior to initiation of services.  Please see initial visit note for detailed documentation.  Patient agreed to services and verbal consent obtained.   Assessment: Review of patient past medical history, allergies, medications, health status, including review of consultants reports, laboratory and other test data, was performed as part of comprehensive evaluation and provision of chronic care management services.   CCM Care Plan  Allergies  Allergen Reactions   Hydrochlorothiazide Other (See Comments)    Gout , uncontrolled diabetes and renal insufficiency    Outpatient Encounter Medications as of 11/25/2020  Medication Sig   albuterol (VENTOLIN HFA) 108 (90 Base) MCG/ACT inhaler Inhale 2 puffs into the lungs every 4 (four) hours as needed for wheezing or shortness of breath.   allopurinol (ZYLOPRIM) 100 MG tablet Take 1/2 (one-half) tablet by mouth once daily   aspirin EC 81 MG tablet Take 1 tablet (81 mg total) by mouth daily.   bismuth subsalicylate (PEPTO BISMOL) 262 MG/15ML suspension Take 30 mLs by mouth every 6 (six) hours as needed for indigestion.   carvedilol (COREG) 6.25 MG tablet Take 1 tablet (6.25 mg total) by mouth 2 (two) times daily with a meal.   colchicine (COLCRYS) 0.6 MG tablet Take 2 tabs once and then one tab one hour later as needed for gout   empagliflozin  (JARDIANCE) 10 MG TABS tablet Take 1 tablet (10 mg total) by mouth daily before breakfast.   hydrALAZINE (APRESOLINE) 50 MG tablet Take 2 tablets (100 mg total) by mouth 3 (three) times daily.   isosorbide mononitrate (IMDUR) 60 MG 24 hr tablet Take 60 mg by mouth in the morning and at bedtime.   metFORMIN (GLUCOPHAGE) 500 MG tablet Take 1 tablet (500 mg total) by mouth 2 (two) times daily with a meal.   nitroGLYCERIN (NITROSTAT) 0.4 MG SL tablet Place 1 tablet (0.4 mg total) under the tongue every 5 (five) minutes as needed for chest pain.   rosuvastatin (CRESTOR) 40 MG tablet Take 1 tablet (40 mg total) by mouth daily.   torsemide (DEMADEX) 20 MG tablet Take 1 tablet (20 mg total) by mouth 2 (two) times a week. Mondays and Fridays   No facility-administered encounter medications on file as of 11/25/2020.    Patient Active Problem List   Diagnosis Date Noted   Physical deconditioning 10/16/2020   Cardiac amyloidosis ([place]) 09/30/2020   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   Progressive angina ([place]) - Atypical; DOE 09/20/2020   Coronary artery disease involving native coronary artery of native heart with angina pectoris ([place]) 09/20/2020   Acute on chronic combined systolic and diastolic CHF (congestive heart failure) ([place]) 09/20/2020   Acute combined systolic and diastolic heart failure ([place]) 09/20/2020   Soft tissue mass 07/30/2020   Contracture of joint of finger of left hand 07/30/2020   Acute respiratory disease due to COVID-19 virus 06/05/2019   DOE (dyspnea on exertion)  03/01/2019   Mild cognitive impairment 12/05/2018   Slurred speech 07/23/2018   Muscle twitching 07/23/2018   OSA (obstructive sleep apnea) 03/27/2018   Snores 01/21/2018   Poor balance 01/23/2017   Ankle swelling, left 09/06/2016   Bradycardia 01/28/2016   Weak urinary stream 12/31/2015   Stage 3b chronic kidney disease 09/15/2015   Ventral hernia without obstruction or gangrene 09/15/2015   Prostate  cancer ([place]) 07/25/2015   Erectile dysfunction 05/04/2015   Urinary frequency 05/04/2015   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   Abnormal finding on MRI of brain 02/14/2015   PEA (Pulseless electrical activity) ([place]) 12/03/2012   Diabetes ([place]) 06/30/2011   Gout 03/05/2007   Essential hypertension 03/05/2007   Conditions to be addressed/monitored:  CHF and DMII  Care Plan : Heart Failure (Adult)  Updates made by Knox Royalty, RN since 11/25/2020 12:00 AM     Problem: Symptom Exacerbation (Heart Failure)   Priority: Medium     Long-Range Goal: Symptom Exacerbation Prevented or Minimized   Start Date: 11/04/2020  Expected End Date: 11/04/2021  This Visit's Progress: On track  Recent Progress: On track  Priority: Medium  Note:   Current Barriers:  Knowledge deficit related to basic heart failure pathophysiology and self care management Unable to independently manage medications: reports decreased vision due to glaucoma Limited health literacy, hard of hearing in (L) ear: wife participates in all of patent medical affairs and is very supportive/ helpful Case Manager Clinical Goal(s):  Over the next 12 months, patient will verbalize understanding of Heart Failure Action Plan and when to call doctor, as evidenced by patient/ caregiver reporting during CCM RN CM outreach of: Completion of daily weight monitoring/ recording (notifying MD of 3 lb weight gain over night or 5 lb in a week) Adhering to low salt diet Taking prescribed medications as prescribed Interventions:  Collaboration with [name], MD regarding development and update of comprehensive plan of care as evidenced by provider attestation and co-signature Inter-disciplinary care team collaboration (see longitudinal plan of care Chart reviewed including relevant office notes, upcoming scheduled appointments, and lab results Discussed current  clinical condition with patient/ caregiver and  confirmed no current clinical concerns; confirmed patient's spouse continues to manage medications for patient due to decreased vision Confirmed that patient/ caregiver spoke with CCM Pharmacist Mendel Ryder for medication review on 11/16/20- they confirmed today that they took the letter of denial for Jardiance to cardiology provider office visit yesterday; report that patient will be running out of Jardiance soon, "within about a week;" will message Mendel Ryder of same, she is assisting with PAP for [facility] cardiology office visit 11/24/20 with patient and spouse-caregiver: confirmed no recent changes to medications, overall plan of care; they share that cardiology provider advised patient to increase diuretic to 3 times weekly- only if he has weight gain and/ or increased LE swelling: they verbalize action plan for extra diuretic with minimal prompting; confirms patient currently taking diuretic twice weekly Reviewed recent daily weights at home with patient and caregiver: they report consistent daily weights between 155-157 lbs, with a weight today of "156 lbs;" denies weight gain, issues around breathing status; previously provided education around action plan for weight gain and/or development of shortness of breath, LE swelling, cough etc was reinforced; patient/ caregiver will require ongoing reinforcement and care management support Confirmed patient has heard from home health PT team: services are expected to start tomorrow: patient's full engagement/ participation was encouraged Confirmed patient continues "trying to"  follow low salt, heart healthy diet, with help from caregiver- spouse-- feels he is making good progress and getting used to diet: positive reinforcement provided Reviewed upcoming provider appointments with patient and confirmed that patient has plans to attend all as scheduled: endocrinology provider 12/03/20; podiatry provider 01/25/21 Self-Care Activities: Patient verbalizes  understanding of plan to continue monitoring/ recording daily weights at home Spouse administers medications as prescribed, due to patient vision issues Attends all scheduled provider appointments Calls pharmacy for medication refills Performs ADL's/ iADL's independently Calls provider office for new concerns or questions Patient Goals: Doristine Devoid job weighing yourself daily and write your weights at home down on paper or on the calendar- we will review these each time we have a telephone visit.  Today you reported a weight of 156 lbs Call office if I gain more than 2 pounds in one day or 5 pounds in one week, especially if this is associated with chest tightness, swelling in your legs/ ankles, and new or worsened shortness of breath Watch for swelling in feet, ankles and legs every day: follow the plan your cardiologist developed to increase your diuretic to 3 times a week if you begin to gain weight or have inccreased swelling in your legs and ankles Keep legs up while sitting Keep limiting the salt in your diet: decrease your salt intake and use salt only in moderation Please read over the attached information- we will review this information periodically during our phone conversations Follow Up Plan:  Telephone follow up appointment with care management team member scheduled for: Wednesday, January 19, 2021 at 1:00 pm The patient has been provided with contact information for the care management team and has been advised to call with any health related questions or concerns.      Care Plan : Diabetes Type 2 (Adult)  Updates made by Knox Royalty, RN since 11/25/2020 12:00 AM     Problem: Glycemic Management (Diabetes, Type 2)   Priority: Low     Long-Range Goal: Glycemic Management Optimized   Start Date: 11/04/2020  Expected End Date: 11/04/2021  This Visit's Progress: On track  Recent Progress: On track  Priority: Low  Note:   Objective:  Lab Results  Component Value Date    HGBA1C 6.4 (H) 09/21/2020   Lab Results  Component Value Date   CREATININE 2.29 (H) 10/27/2020   CREATININE 2.67 (H) 10/08/2020   CREATININE 2.34 (H) 09/30/2020   Lab Results  Component Value Date   EGFR 42 (L) 09/15/2020   Current Barriers:  Knowledge Deficits related to basic Diabetes pathophysiology and self care/management: patient and spouse could benefit from ongoing reinforcement of established knowledge baseline Difficulty obtaining or cannot afford medications- reports difficult to afford Jardiance- will make CCM Pharmacy team aware of same Unable to self administer medications as prescribed- reports wife manages medications due to poor vision Case Manager Clinical Goal(s):  Over the next 12 months, patient will demonstrate improved adherence to prescribed treatment plan for diabetes self care/management as evidenced by patient and careiver reporting during CCM RN CM outreach of:  Daily monitoring and recording of CBG   Adherence to ADA/ carb modified diet  Adherence to prescribed medication regimen  Contacting provider for new or worsened symptoms or questions Interventions:  Collaboration with [name], MD regarding development and update of comprehensive plan of care as evidenced by provider attestation and co-signature Inter-disciplinary care team collaboration (see longitudinal plan of care) Review of patient status, including review of consultants reports,  relevant laboratory and other test results, and medications completed Discussed current clinical condition with patient and spouse and confirmed no current clinical concerns Confirmed that patient/ caregiver has spoken to [place], [place] Pharmacist: they report she is assisting with PAP for Jardiance: they report today that they are "almost out;" state they took letter from social security denying coverage to cardiology provider office visit yesterday; will message Mendel Ryder that patient is almost out of Jardiance without  refills; report that patient has "probably about a week" of this medication left before he runs out Confirmed that patient/ caregiver plan to attend upcoming endocrinology appointment scheduled 12/03/20: spouse verbalizes frustration that provider did not authorize patient's diabetic shoes at time of recent podiatry appointment: she plans to discuss with provider at time of office visit Confirmed patient has continues monitoring/ recording blood sugars at home, mainly fasting; today, patient reports fasting blood sugar ranges between "100-130" with a fasting blood sugar this morning of "102" Continues trying to follow diabetic, low carb, sugar, salt diet: struggles occasionally because he can't eat what others in the family do; however, wife is helping patient adjust and keep in perspective benefit of following this diet Reinforced previously provided education around self-health management of DM, importance of maintenance activities at home, adherence to medications and diet Reinforced/ reviewed recent historical trends of A1-C values with patient- patient/ spouse will require ongoing reinforcement of A1-C education; noted today that patient's most recent A1-C (pre-endocrinology provider office visit) was 7.2 (11/23/20), which is slightly higher than last A1-C of 6.4 Reviewed scheduled/upcoming provider appointments including: Dr. Dwyane Dee, endocrinology provider on 12/03/20 Discussed plans with patient for ongoing care management follow up and provided patient with direct contact information for care management team Self-Care Activities UNABLE to independently manage medications at home: Patient's spouse manages medications; they would like information on patient assistance resources for Jardiance Attends all scheduled provider appointments Checks blood sugars as instructed and calls care providers for concerns Adheres to prescribed ADA/carb modified Patient Goals: Continue checking your blood sugar twice  a day: first thing in the morning before you eat, and again at night after a meal Please continue writing your blood sugars down on paper so we can review these together during our future phone calls Check blood sugar extra during the day if you don't feel normal or feel it is too high or too low Today, you reported a fasting blood sugar of 102 Please continue eating foods that are low in sugar and carbohydrates Take the blood sugars you write down on paper to all doctor visits  Follow Up Plan:  Telephone follow up appointment with care management team member scheduled for: Wednesday January 19, 2021 at 1:00 pm The patient has been provided with contact information for the care management team and has been advised to call with any health related questions or concerns.      Plan: Telephone follow up appointment with care management team member scheduled for: Wednesday January 19, 2021  The patient has been provided with contact information for the care management team and has been advised to call with any health related questions or concerns.   [name], RN, BSN, [facility] RN Care Coordination- [place] [phone]: direct office [phone]: mobile

## 2020-11-26 DIAGNOSIS — I25119 Atherosclerotic heart disease of native coronary artery with unspecified angina pectoris: Secondary | ICD-10-CM | POA: Diagnosis not present

## 2020-11-26 DIAGNOSIS — M6281 Muscle weakness (generalized): Secondary | ICD-10-CM | POA: Diagnosis not present

## 2020-11-26 DIAGNOSIS — E1122 Type 2 diabetes mellitus with diabetic chronic kidney disease: Secondary | ICD-10-CM | POA: Diagnosis not present

## 2020-11-26 DIAGNOSIS — I10 Essential (primary) hypertension: Secondary | ICD-10-CM | POA: Diagnosis not present

## 2020-11-26 DIAGNOSIS — M1A9XX0 Chronic gout, unspecified, without tophus (tophi): Secondary | ICD-10-CM | POA: Diagnosis not present

## 2020-11-26 DIAGNOSIS — R2689 Other abnormalities of gait and mobility: Secondary | ICD-10-CM | POA: Diagnosis not present

## 2020-11-26 DIAGNOSIS — N1832 Chronic kidney disease, stage 3b: Secondary | ICD-10-CM | POA: Diagnosis not present

## 2020-11-26 DIAGNOSIS — I5040 Unspecified combined systolic (congestive) and diastolic (congestive) heart failure: Secondary | ICD-10-CM | POA: Diagnosis not present

## 2020-11-29 ENCOUNTER — Other Ambulatory Visit: Payer: Self-pay | Admitting: Internal Medicine

## 2020-11-30 DIAGNOSIS — M6281 Muscle weakness (generalized): Secondary | ICD-10-CM | POA: Diagnosis not present

## 2020-11-30 DIAGNOSIS — I25119 Atherosclerotic heart disease of native coronary artery with unspecified angina pectoris: Secondary | ICD-10-CM | POA: Diagnosis not present

## 2020-11-30 DIAGNOSIS — E1122 Type 2 diabetes mellitus with diabetic chronic kidney disease: Secondary | ICD-10-CM | POA: Diagnosis not present

## 2020-11-30 DIAGNOSIS — N1832 Chronic kidney disease, stage 3b: Secondary | ICD-10-CM | POA: Diagnosis not present

## 2020-11-30 DIAGNOSIS — I10 Essential (primary) hypertension: Secondary | ICD-10-CM | POA: Diagnosis not present

## 2020-11-30 DIAGNOSIS — M1A9XX0 Chronic gout, unspecified, without tophus (tophi): Secondary | ICD-10-CM | POA: Diagnosis not present

## 2020-11-30 DIAGNOSIS — R2689 Other abnormalities of gait and mobility: Secondary | ICD-10-CM | POA: Diagnosis not present

## 2020-11-30 DIAGNOSIS — I5040 Unspecified combined systolic (congestive) and diastolic (congestive) heart failure: Secondary | ICD-10-CM | POA: Diagnosis not present

## 2020-12-02 DIAGNOSIS — H903 Sensorineural hearing loss, bilateral: Secondary | ICD-10-CM | POA: Diagnosis not present

## 2020-12-02 DIAGNOSIS — N1832 Chronic kidney disease, stage 3b: Secondary | ICD-10-CM | POA: Diagnosis not present

## 2020-12-02 DIAGNOSIS — I5040 Unspecified combined systolic (congestive) and diastolic (congestive) heart failure: Secondary | ICD-10-CM | POA: Diagnosis not present

## 2020-12-02 DIAGNOSIS — I10 Essential (primary) hypertension: Secondary | ICD-10-CM | POA: Diagnosis not present

## 2020-12-02 DIAGNOSIS — I25119 Atherosclerotic heart disease of native coronary artery with unspecified angina pectoris: Secondary | ICD-10-CM | POA: Diagnosis not present

## 2020-12-02 DIAGNOSIS — E1122 Type 2 diabetes mellitus with diabetic chronic kidney disease: Secondary | ICD-10-CM | POA: Diagnosis not present

## 2020-12-02 DIAGNOSIS — R2689 Other abnormalities of gait and mobility: Secondary | ICD-10-CM | POA: Diagnosis not present

## 2020-12-02 DIAGNOSIS — M6281 Muscle weakness (generalized): Secondary | ICD-10-CM | POA: Diagnosis not present

## 2020-12-02 DIAGNOSIS — M1A9XX0 Chronic gout, unspecified, without tophus (tophi): Secondary | ICD-10-CM | POA: Diagnosis not present

## 2020-12-03 ENCOUNTER — Encounter: Payer: Self-pay | Admitting: Endocrinology

## 2020-12-03 ENCOUNTER — Other Ambulatory Visit: Payer: Self-pay | Admitting: Endocrinology

## 2020-12-03 ENCOUNTER — Other Ambulatory Visit: Payer: Self-pay

## 2020-12-03 ENCOUNTER — Ambulatory Visit (INDEPENDENT_AMBULATORY_CARE_PROVIDER_SITE_OTHER): Payer: Medicare HMO | Admitting: Endocrinology

## 2020-12-03 VITALS — BP 142/82 | HR 59 | Ht 66.0 in | Wt 159.8 lb

## 2020-12-03 DIAGNOSIS — E1165 Type 2 diabetes mellitus with hyperglycemia: Secondary | ICD-10-CM

## 2020-12-03 DIAGNOSIS — N1832 Chronic kidney disease, stage 3b: Secondary | ICD-10-CM | POA: Diagnosis not present

## 2020-12-03 NOTE — Progress Notes (Signed)
Patient ID: Corey [name], male   DOB: [birth year], 75 y.o.   MRN: [id number]   Reason for Appointment : Followup of various problems  History of Present Illness          Diagnosis: Type 2 diabetes mellitus, date of diagnosis: 1997      Past history: The patient was initially treated with various oral hypoglycemic drugs and details are not available. About 2009 he was started on insulin and has either taken premixed insulin or Lantus once a day He had difficulty affording his Lantus insulin and had preferred to take premixed insulin Subsequently insulin was tapered off  Recent history:    Oral hypoglycemic drugs: Metformin ER 500 mg, 1 tablet twice daily, Jardiance 10 mg daily  Current blood sugar patterns and problems:  His A1c is 7.2, previously 6.4  Previous fructosamine was 320  His blood sugars are being monitored with a generic meter and this does not appear to have the right date and possibly time programmed  Since his last visit Amaryl has been stopped possibly when he was in the hospital in 5/22 Also in the hospital he was started on Jardiance because of CHF  Although he is not having significantly high readings he is checking blood sugars mostly before breakfast and dinnertime  Highest reading at home 193  Lost a significant amount of weight, about 25 pounds since his last visit in April  This is likely to be from cutting out fried foods and other high-fat meals Also may be benefiting from taking diuretics Recently has started doing some walking for exercise but not as much as before Despite his renal dysfunction his metformin has not been stopped    Glucose monitoring:  done less than 1 time a day         Glucometer:  Accu-Chek       Blood Glucose readings from meter recent range 69-193  PREVIOUSLY:  PRE-MEAL Fasting Lunch Dinner Bedtime Overall  Glucose range:  102-148      Mean/median:  135    136   POST-MEAL PC Breakfast PC Lunch PC Dinner   Glucose range:  130, 242  125  101, 151  Mean/median:        Self-care:       Meals: 2-3 meals per day. Egg in am at 9-10 am or grits, rarely sandwich at lunch. His largest meal is at suppertime at about 8 pm.         Dietician visit: Most recent: 02/2014               Compliance with the medical regimen:  Fair.    Wt Readings from Last 3 Encounters:  12/03/20 159 lb 12.8 oz (72.5 kg)  11/24/20 158 lb (71.7 kg)  10/27/20 157 lb 9.6 oz (71.5 kg)    Lab Results  Component Value Date   HGBA1C 7.2 (H) 11/23/2020   HGBA1C 6.4 (H) 09/21/2020   HGBA1C 6.4 09/06/2020    Lab Results  Component Value Date   MICROALBUR 8.5 (H) 11/23/2020   LDLCALC 34 11/23/2020   CREATININE 2.08 (H) 11/23/2020   No visits with results within 1 Week(s) from this visit.  Latest known visit with results is:  Lab on 11/23/2020  Component Date Value Ref Range Status   Cholesterol 11/23/2020 88  0 - 200 mg/dL Final   ATP III Classification       Desirable:  < 200 mg/dL  Borderline High:  200 - 239 mg/dL          High:  > = 240 mg/dL   Triglycerides 11/23/2020 64.0  0.0 - 149.0 mg/dL Final   Normal:  <150 mg/dLBorderline High:  150 - 199 mg/dL   HDL 11/23/2020 41.90  >39.00 mg/dL Final   VLDL 11/23/2020 12.8  0.0 - 40.0 mg/dL Final   LDL Cholesterol 11/23/2020 34  0 - 99 mg/dL Final   Total CHOL/HDL Ratio 11/23/2020 2   Final                  Men          Women1/2 Average Risk     3.4          3.3Average Risk          5.0          4.42X Average Risk          9.6          7.13X Average Risk          15.0          11.0                       NonHDL 11/23/2020 46.36   Final   NOTE:  Non-HDL goal should be 30 mg/dL higher than patient's LDL goal (i.e. LDL goal of < 70 mg/dL, would have non-HDL goal of < 100 mg/dL)   Microalb, Ur 11/23/2020 8.5 (A) 0.0 - 1.9 mg/dL Final   Creatinine,U 11/23/2020 83.3  mg/dL Final   Microalb Creat Ratio 11/23/2020 10.2  0.0 - 30.0 mg/g Final   Sodium 11/23/2020  142  135 - 145 mEq/L Final   Potassium 11/23/2020 4.3  3.5 - 5.1 mEq/L Final   Chloride 11/23/2020 106  96 - 112 mEq/L Final   CO2 11/23/2020 29  19 - 32 mEq/L Final   Glucose, Bld 11/23/2020 85  70 - 99 mg/dL Final   BUN 11/23/2020 39 (A) 6 - 23 mg/dL Final   Creatinine, Ser 11/23/2020 2.08 (A) 0.40 - 1.50 mg/dL Final   Total Bilirubin 11/23/2020 0.6  0.2 - 1.2 mg/dL Final   Alkaline Phosphatase 11/23/2020 64  39 - 117 U/L Final   AST 11/23/2020 35  0 - 37 U/L Final   ALT 11/23/2020 20  0 - 53 U/L Final   Total Protein 11/23/2020 7.1  6.0 - 8.3 g/dL Final   Albumin 11/23/2020 4.1  3.5 - 5.2 g/dL Final   GFR 11/23/2020 31.21 (A) >60.00 mL/min Final   Calculated using the CKD-EPI Creatinine Equation (2021)   Calcium 11/23/2020 10.6 (A) 8.4 - 10.5 mg/dL Final   Hgb A1c MFr Bld 11/23/2020 7.2 (A) 4.6 - 6.5 % Final   Glycemic Control Guidelines for People with Diabetes:Non Diabetic:  <6%Goal of Therapy: <7%Additional Action Suggested:  >8%    Other active medical problems addressed: See review of systems   Allergies as of 12/03/2020       Reactions   Hydrochlorothiazide Other (See Comments)   Gout , uncontrolled diabetes and renal insufficiency        Medication List        Accurate as of December 03, 2020 10:31 AM. If you have any questions, ask your nurse or doctor.          albuterol 108 (90 Base) MCG/ACT inhaler Commonly known as: VENTOLIN HFA Inhale 2 puffs into the lungs every  4 (four) hours as needed for wheezing or shortness of breath.   allopurinol 100 MG tablet Commonly known as: ZYLOPRIM Take 1/2 (one-half) tablet by mouth once daily   aspirin EC 81 MG tablet Take 1 tablet (81 mg total) by mouth daily.   bismuth subsalicylate 176 HY/07PX suspension Commonly known as: PEPTO BISMOL Take 30 mLs by mouth every 6 (six) hours as needed for indigestion.   carvedilol 6.25 MG tablet Commonly known as: COREG Take 1 tablet (6.25 mg total) by mouth 2 (two) times daily  with a meal.   colchicine 0.6 MG tablet Commonly known as: Colcrys Take 2 tabs once and then one tab one hour later as needed for gout   empagliflozin 10 MG Tabs tablet Commonly known as: Jardiance Take 1 tablet (10 mg total) by mouth daily before breakfast.   hydrALAZINE 50 MG tablet Commonly known as: APRESOLINE Take 2 tablets (100 mg total) by mouth 3 (three) times daily.   isosorbide mononitrate 60 MG 24 hr tablet Commonly known as: IMDUR Take 60 mg by mouth in the morning and at bedtime.   metFORMIN 500 MG tablet Commonly known as: GLUCOPHAGE Take 1 tablet (500 mg total) by mouth 2 (two) times daily with a meal.   nitroGLYCERIN 0.4 MG SL tablet Commonly known as: Nitrostat Place 1 tablet (0.4 mg total) under the tongue every 5 (five) minutes as needed for chest pain.   rosuvastatin 40 MG tablet Commonly known as: CRESTOR Take 1 tablet (40 mg total) by mouth daily.   torsemide 20 MG tablet Commonly known as: DEMADEX Take 1 tablet (20 mg total) by mouth 2 (two) times a week. Mondays and Fridays        Allergies:  Allergies  Allergen Reactions   Hydrochlorothiazide Other (See Comments)    Gout , uncontrolled diabetes and renal insufficiency    Past Medical History:  Diagnosis Date   Cataract    Chronic heart failure with preserved ejection fraction (HFpEF) (HCC)    Colon polyp 2003   Dr [name]; F/U was to be 2008( not completed)   CVA (cerebral infarction) 2014   Diabetes mellitus    Diverticulosis 2003   Dyspnea    with exertion   Gout    Hypertension    Myocardial infarction ([place]) 2014   ?? PEA cardiac arrest   OSA (obstructive sleep apnea) 03/27/2018   Pneumonia 09/29/11   Avelox X 10 days as OP   Prostate cancer ([place])    Renal insufficiency    Seizures ([place])    not treated for seizure disorder; had a seizure after stroke 2014; no seizure since then    Past Surgical History:  Procedure Laterality Date   COLONOSCOPY W/ POLYPECTOMY  2003    no F/U ([place] discussed 12/03/12)   INSERTION OF MESH N/A 07/16/2017   Procedure: INSERTION OF MESH;  Surgeon: [name], MD;  Location: [facility];  Service: General;  Laterality: N/A;   PEG PLACEMENT  2014   PEG TUBE REMOVAL  2014   PROSTATE BIOPSY     RIGHT/LEFT HEART CATH AND CORONARY ANGIOGRAPHY N/A 09/20/2020   Procedure: RIGHT/LEFT HEART CATH AND CORONARY ANGIOGRAPHY;  Surgeon: Leonie Man, MD;  Location: [facility];  Service: Cardiovascular;  Laterality: N/A;   TEE WITHOUT CARDIOVERSION N/A 09/27/2020   Procedure: TRANSESOPHAGEAL ECHOCARDIOGRAM (TEE);  Surgeon: Wonda Olds, MD;  Location: [facility];  Service: Thoracic;  Laterality: N/A;   TRACHEOSTOMY  2014  was closed after hospital stay   [place] N/A 07/16/2017   Procedure: [place];  Surgeon: [name], MD;  Location: [facility];  Service: General;  Laterality: N/A;   wrist aspiration  02/16/12    monosodium urate crystals; Dr Caralyn Guile    Family History  Problem Relation Age of Onset   Hypertension Father    Prostate cancer Father    Diabetes Father    Cancer Father    Hypertension Mother    Diabetes Mother    Stroke Neg Hx    Heart disease Neg Hx    Colon cancer Neg Hx     Social History:  reports that he quit smoking about 52 years ago. His smoking use included cigarettes. He has a 0.25 pack-year smoking history. He has never used smokeless tobacco. He reports that he does not drink alcohol and does not use drugs.    Review of Systems   Lipids: Has normal lipids, not on a statin drug  Lab Results  Component Value Date   CHOL 88 11/23/2020   HDL 41.90 11/23/2020   LDLCALC 34 11/23/2020   TRIG 64.0 11/23/2020   CHOLHDL 2 11/23/2020    Eye exams: Last done in 1/20 without retinopathy     He has renal insufficiency of unclear etiology with variable creatinine levels as follows:   Lab Results  Component Value Date   CREATININE 2.08 (H)  11/23/2020   CREATININE 2.29 (H) 10/27/2020   CREATININE 2.67 (H) 10/08/2020     HYPERTENSION: on losartan 50 mg, amlodipine , clonidine 0.3 mg and hydralazine as well as metoprolol  Currently managed by PCP and cardiologist  Monitors blood pressure at home  He also takes Lasix for edema   BP Readings from Last 3 Encounters:  12/03/20 (!) 142/82  11/24/20 122/70  10/27/20 124/66    Foot exam done in 3/20 with PCP  His wife thinks that he may get short of breath Also she thinks that he tends to sleep a lot  Physical Examination:  BP (!) 142/82   Pulse (!) 59   Ht 5\' 6"  (1.676 m)   Wt 159 lb 12.8 oz (72.5 kg)   SpO2 98%   BMI 25.79 kg/m     ASSESSMENT/PLAN:  Diabetes type 2, uncontrolled   See history of present illness for discussion of current diabetes management, blood sugar patterns and problems identified  A1c is 7.2, previously 6.4  He is on metformin 500 mg twice a day and Jardiance 10 mg Likely an A1c is higher because of not being on Amaryl low-dose as he was on his last visit This is despite his significant weight loss Although his home readings are not usually higher he likely has high postprandial readings  Since his creatinine is significantly high at about 2.0 we will stop metformin Increase Jardiance to 25 mg, not clear if this is sufficient for diabetes control However for cost reasons if his blood sugars are consistently high may consider either glipizide or Rybelsus on the next visit He also needs to start using Accu-Chek meter which is more reliable and can be downloaded Discussed timing of glucose monitoring and blood sugar targets after meals  Renal dysfunction: Creatinine is significantly worse, likely needs to be evaluated by nephrologist, will defer to PCP  High calcium level of 10.6: He is not on any calcium supplements or vitamin D, will repeat with PTH level on the next visit  Will need  short-term follow-up  There are no Patient  Instructions on file for this visit.   Elayne Snare 12/03/2020, 10:31 AM

## 2020-12-03 NOTE — Patient Instructions (Addendum)
Stop Metformin   Call re: brand of Accucheck meter  Jardiance 2 of 10mg  daily  Check blood sugars on waking up 2 days a week  Also check blood sugars about 2 hours after meals and do this after different meals by rotation  Recommended blood sugar levels on waking up are 90-130 and about 2 hours after meal is 130-160  Please bring your blood sugar monitor to each visit, thank you

## 2020-12-04 MED ORDER — ACCU-CHEK AVIVA PLUS VI STRP: ORAL_STRIP | 12 refills | Status: DC

## 2020-12-06 DIAGNOSIS — N1832 Chronic kidney disease, stage 3b: Secondary | ICD-10-CM | POA: Diagnosis not present

## 2020-12-06 DIAGNOSIS — I25119 Atherosclerotic heart disease of native coronary artery with unspecified angina pectoris: Secondary | ICD-10-CM | POA: Diagnosis not present

## 2020-12-06 DIAGNOSIS — I5040 Unspecified combined systolic (congestive) and diastolic (congestive) heart failure: Secondary | ICD-10-CM | POA: Diagnosis not present

## 2020-12-06 DIAGNOSIS — M1A9XX0 Chronic gout, unspecified, without tophus (tophi): Secondary | ICD-10-CM | POA: Diagnosis not present

## 2020-12-06 DIAGNOSIS — I10 Essential (primary) hypertension: Secondary | ICD-10-CM | POA: Diagnosis not present

## 2020-12-06 DIAGNOSIS — R2689 Other abnormalities of gait and mobility: Secondary | ICD-10-CM | POA: Diagnosis not present

## 2020-12-06 DIAGNOSIS — E1122 Type 2 diabetes mellitus with diabetic chronic kidney disease: Secondary | ICD-10-CM | POA: Diagnosis not present

## 2020-12-06 DIAGNOSIS — M6281 Muscle weakness (generalized): Secondary | ICD-10-CM | POA: Diagnosis not present

## 2020-12-07 ENCOUNTER — Ambulatory Visit: Payer: Medicare HMO | Admitting: *Deleted

## 2020-12-07 DIAGNOSIS — E1165 Type 2 diabetes mellitus with hyperglycemia: Secondary | ICD-10-CM

## 2020-12-07 DIAGNOSIS — I5040 Unspecified combined systolic (congestive) and diastolic (congestive) heart failure: Secondary | ICD-10-CM

## 2020-12-07 NOTE — Chronic Care Management (AMB) (Signed)
Chronic Care Management   CCM RN Visit Note  12/07/2020 Name: Corey [name] MRN: [id number] DOB: May 18, 1945  Subjective: Corey [name] is a 75 y.o. year old male who is a primary care patient of Burns, [name], MD. The care management team was consulted for assistance with disease management and care coordination needs.    Engaged with patient and his caregiver/ spouse Corey [name] on [facility] DPR by telephone for acute call in response to provider referral for case management and/or care coordination services.  Received off-hours voice message from patient's spouse requesting call-back regarding patient's recent endocrinology provider appointment; she verbalizes questions around changes made to patient's medications at time of endocrinology office visit; call returned this afternoon  Consent to Services:  The patient was given information about Chronic Care Management services, agreed to services, and gave verbal consent prior to initiation of services.  Please see initial visit note for detailed documentation.  Patient agreed to services and verbal consent obtained.   Assessment: Review of patient past medical history, allergies, medications, health status, including review of consultants reports, laboratory and other test data, was performed as part of comprehensive evaluation and provision of chronic care management services.   CCM Care Plan  Allergies  Allergen Reactions   Hydrochlorothiazide Other (See Comments)    Gout , uncontrolled diabetes and renal insufficiency    Outpatient Encounter Medications as of 12/07/2020  Medication Sig Note   empagliflozin (JARDIANCE) 10 MG TABS tablet Take 1 tablet (10 mg total) by mouth daily before breakfast. 12/07/2020: 12/07/20: Caregiver reports this medication was increased 20 mg po QD during recent endocrinology provider appointment 12/03/20- reports patient has increased dose    ACCU-CHEK AVIVA PLUS test strip TEST BLOOD SUGAR TWICE DAILY AS DIRECTED     ACCU-CHEK AVIVA PLUS test strip Use as instructed    albuterol (VENTOLIN HFA) 108 (90 Base) MCG/ACT inhaler Inhale 2 puffs into the lungs every 4 (four) hours as needed for wheezing or shortness of breath.    allopurinol (ZYLOPRIM) 100 MG tablet Take 1/2 (one-half) tablet by mouth once daily    aspirin EC 81 MG tablet Take 1 tablet (81 mg total) by mouth daily.    bismuth subsalicylate (PEPTO BISMOL) 262 MG/15ML suspension Take 30 mLs by mouth every 6 (six) hours as needed for indigestion.    carvedilol (COREG) 6.25 MG tablet Take 1 tablet (6.25 mg total) by mouth 2 (two) times daily with a meal.    colchicine (COLCRYS) 0.6 MG tablet Take 2 tabs once and then one tab one hour later as needed for gout    hydrALAZINE (APRESOLINE) 50 MG tablet Take 2 tablets (100 mg total) by mouth 3 (three) times daily.    isosorbide mononitrate (IMDUR) 60 MG 24 hr tablet Take 60 mg by mouth in the morning and at bedtime.    nitroGLYCERIN (NITROSTAT) 0.4 MG SL tablet Place 1 tablet (0.4 mg total) under the tongue every 5 (five) minutes as needed for chest pain.    rosuvastatin (CRESTOR) 40 MG tablet Take 1 tablet (40 mg total) by mouth daily.    torsemide (DEMADEX) 20 MG tablet Take 1 tablet (20 mg total) by mouth 2 (two) times a week. Mondays and Fridays    No facility-administered encounter medications on file as of 12/07/2020.    Patient Active Problem List   Diagnosis Date Noted   Physical deconditioning 10/16/2020   Cardiac amyloidosis ([place]) 09/30/2020   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   Progressive angina (  [place]) - Atypical; DOE 09/20/2020   Coronary artery disease involving native coronary artery of native heart with angina pectoris ([place]) 09/20/2020   Acute on chronic combined systolic and diastolic CHF (congestive heart failure) ([place]) 09/20/2020   Acute combined systolic and diastolic heart failure ([place]) 09/20/2020   Soft tissue mass 07/30/2020   Contracture of joint of finger of left hand  07/30/2020   Acute respiratory disease due to COVID-19 virus 06/05/2019   DOE (dyspnea on exertion) 03/01/2019   Mild cognitive impairment 12/05/2018   Slurred speech 07/23/2018   Muscle twitching 07/23/2018   OSA (obstructive sleep apnea) 03/27/2018   Snores 01/21/2018   Poor balance 01/23/2017   Ankle swelling, left 09/06/2016   Bradycardia 01/28/2016   Weak urinary stream 12/31/2015   Stage 3b chronic kidney disease 09/15/2015   Ventral hernia without obstruction or gangrene 09/15/2015   Prostate cancer ([place]) 07/25/2015   Erectile dysfunction 05/04/2015   Urinary frequency 05/04/2015   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   Abnormal finding on MRI of brain 02/14/2015   PEA (Pulseless electrical activity) ([place]) 12/03/2012   Diabetes ([place]) 06/30/2011   Gout 03/05/2007   Essential hypertension 03/05/2007   Conditions to be addressed/monitored:  CHF and DMII  Care Plan : Heart Failure (Adult)  Updates made by Knox Royalty, RN since 12/07/2020 12:00 AM     Problem: Symptom Exacerbation (Heart Failure)   Priority: Medium     Long-Range Goal: Symptom Exacerbation Prevented or Minimized   Start Date: 11/04/2020  Expected End Date: 11/04/2021  This Visit's Progress: On track  Recent Progress: On track  Priority: Medium  Note:   Current Barriers:  Knowledge deficit related to basic heart failure pathophysiology and self care management Unable to independently manage medications: reports decreased vision due to glaucoma Limited health literacy, hard of hearing in (L) ear: wife participates in all of patent medical affairs and is very supportive/ helpful Case Manager Clinical Goal(s):  Over the next 12 months, patient will verbalize understanding of Heart Failure Action Plan and when to call doctor, as evidenced by patient/ caregiver reporting during CCM RN CM outreach of: Completion of daily weight monitoring/ recording (notifying MD of 3 lb weight gain  over night or 5 lb in a week) Adhering to low salt diet Taking prescribed medications as prescribed Interventions:  Collaboration with [name], MD regarding development and update of comprehensive plan of care as evidenced by provider attestation and co-signature Inter-disciplinary care team collaboration (see longitudinal plan of care Chart reviewed including relevant office notes, upcoming scheduled appointments, and lab results Acute call 12/07/20: -- confirmed patient has been monitoring/ recording daily weights at home: reports weight today of 157 lbs; states his lower extremities "got swollen up" briefly last week;" states that this has now resolved -- reviewed previously provided education around signs/ symptoms yellow CHF zone along with corresponding action plan: patient remains unable to independently verbalize; requires ongoing reinforcement with verbal prompting -- reiterated need to balance fluid intake/ use of diuretic/ electrolyte balance in setting of CKD- patient/ caregiver confirm patient has not needed to take additional diuretic recently From 11/25/20: Discussed current  clinical condition with patient/ caregiver and confirmed no current clinical concerns; confirmed patient's spouse continues to manage medications for patient due to decreased vision Confirmed that patient/ caregiver spoke with CCM Pharmacist Mendel Ryder for medication review on 11/16/20- they confirmed today that they took the letter of denial for Jardiance to cardiology provider office visit yesterday; report  that patient will be running out of Jardiance soon, "within about a week;" will message Mendel Ryder of same, she is assisting with PAP for [place] cardiology office visit 11/24/20 with patient and spouse-caregiver: confirmed no recent changes to medications, overall plan of care; they share that cardiology provider advised patient to increase diuretic to 3 times weekly- only if he has weight gain and/ or  increased LE swelling: they verbalize action plan for extra diuretic with minimal prompting; confirms patient currently taking diuretic twice weekly Reviewed recent daily weights at home with patient and caregiver: they report consistent daily weights between 155-157 lbs, with a weight today of "156 lbs;" denies weight gain, issues around breathing status; previously provided education around action plan for weight gain and/or development of shortness of breath, LE swelling, cough etc was reinforced; patient/ caregiver will require ongoing reinforcement and care management support Confirmed patient has heard from home health PT team: services are expected to start tomorrow: patient's full engagement/ participation was encouraged Confirmed patient continues "trying to" follow low salt, heart healthy diet, with help from caregiver- spouse-- feels he is making good progress and getting used to diet: positive reinforcement provided Reviewed upcoming provider appointments with patient and confirmed that patient has plans to attend all as scheduled: endocrinology provider 12/03/20; podiatry provider 01/25/21 Self-Care Activities: Patient verbalizes understanding of plan to continue monitoring/ recording daily weights at home Spouse administers medications as prescribed, due to patient vision issues Attends all scheduled provider appointments Calls pharmacy for medication refills Performs ADL's/ iADL's independently Calls provider office for new concerns or questions Patient Goals: Doristine Devoid job weighing yourself daily and write your weights at home down on paper or on the calendar- we will review these each time we have a telephone visit.  Today you reported a weight of 157 lbs Call office if I gain more than 2 pounds in one day or 5 pounds in one week, especially if this is associated with chest tightness, swelling in your legs/ ankles, and new or worsened shortness of breath Watch for swelling in feet, ankles  and legs every day: follow the plan your cardiologist developed to increase your diuretic to 3 times a week if you begin to gain weight or have inccreased swelling in your legs and ankles Keep legs up while sitting Keep limiting the salt in your diet: decrease your salt intake and use salt only in moderation Please read over the attached information- we will review this information periodically during our phone conversations Follow Up Plan:  Telephone follow up appointment with care management team member scheduled for: Wednesday, January 19, 2021 at 1:00 pm The patient has been provided with contact information for the care management team and has been advised to call with any health related questions or concerns.      Care Plan : Diabetes Type 2 (Adult)  Updates made by Knox Royalty, RN since 12/07/2020 12:00 AM     Problem: Glycemic Management (Diabetes, Type 2)   Priority: Low     Long-Range Goal: Glycemic Management Optimized   Start Date: 11/04/2020  Expected End Date: 11/04/2021  This Visit's Progress: On track  Recent Progress: On track  Priority: Low  Note:   Objective:  Lab Results  Component Value Date   HGBA1C 6.4 (H) 09/21/2020   Lab Results  Component Value Date   CREATININE 2.29 (H) 10/27/2020   CREATININE 2.67 (H) 10/08/2020   CREATININE 2.34 (H) 09/30/2020   Lab Results  Component Value Date  EGFR 42 (L) 09/15/2020   Current Barriers:  Knowledge Deficits related to basic Diabetes pathophysiology and self care/management: patient and spouse could benefit from ongoing reinforcement of established knowledge baseline Difficulty obtaining or cannot afford medications- reports difficult to afford [place] team active/ aware of same Unable to self administer medications as prescribed- reports wife manages medications due to poor vision Case Manager Clinical Goal(s):  Over the next 12 months, patient will demonstrate improved adherence to  prescribed treatment plan for diabetes self care/management as evidenced by patient and careiver reporting during CCM RN CM outreach of:  Daily monitoring and recording of CBG   Adherence to ADA/ carb modified diet  Adherence to prescribed medication regimen  Contacting provider for new or worsened symptoms or questions Interventions:  Collaboration with [name], MD regarding development and update of comprehensive plan of care as evidenced by provider attestation and co-signature Inter-disciplinary care team collaboration (see longitudinal plan of care) Review of patient status, including review of consultants reports, relevant laboratory and other test results, and medications completed Discussed current clinical condition with patient and spouse and confirmed no current clinical concerns Acute call 12/07/20: -- discussed with patient and his spouse/ caregiver reasoning for endocrinologist to discontinue metformin and increase jardiance:  reviewed provider office visit 12/03/20 with both; both patient and spouse report good understanding of rationale for changes made during endocrinology provider office visit once our phone conversation is completed -- confirmed that patient understands to take (2) jardiance 10 mg po QD- reports he will start doing this -- reviewed recent blood sugars with patient: he does not have his written blood sugars with him, but reports that "they are running good" in the mornings- reports recent fasting ranges between 85-104 -- admits he has not been consistently recording blood sugars at home: re-reviewed with patient/ spouse timing of blood sugar monitoring along with rationale -- reviewed recent A1-C of 7.2-- discussed with patent the significance of A1-C value (increased from 6.4- 09/21/20) -- confirmed patient has follow up appointment scheduled with endocrinology provider 01/05/21 along with plans to attend as scheduled From 11/25/20 CCM RN CM outreach: Confirmed  that patient/ caregiver has spoken to [place], [employer] Pharmacist: they report she is assisting with PAP for Jardiance: they report today that they are "almost out;" state they took letter from social security denying coverage to cardiology provider office visit yesterday; will message Mendel Ryder that patient is almost out of Jardiance without refills; report that patient has "probably about a week" of this medication left before he runs out Confirmed that patient/ caregiver plan to attend upcoming endocrinology appointment scheduled 12/03/20: spouse verbalizes frustration that provider did not authorize patient's diabetic shoes at time of recent podiatry appointment: she plans to discuss with provider at time of office visit Confirmed patient has continues monitoring/ recording blood sugars at home, mainly fasting; today, patient reports fasting blood sugar ranges between "100-130" with a fasting blood sugar this morning of "102" Continues trying to follow diabetic, low carb, sugar, salt diet: struggles occasionally because he can't eat what others in the family do; however, wife is helping patient adjust and keep in perspective benefit of following this diet Reinforced previously provided education around self-health management of DM, importance of maintenance activities at home, adherence to medications and diet Reinforced/ reviewed recent historical trends of A1-C values with patient- patient/ spouse will require ongoing reinforcement of A1-C education; noted today that patient's most recent A1-C (pre-endocrinology provider office visit) was 7.2 (11/23/20), which is  slightly higher than last A1-C of 6.4 Reviewed scheduled/upcoming provider appointments including: Dr. Dwyane Dee, endocrinology provider on 12/03/20 Discussed plans with patient for ongoing care management follow up and provided patient with direct contact information for care management team Self-Care Activities UNABLE to independently manage medications  at home: Patient's spouse manages medications; they would like information on patient assistance resources for Jardiance Attends all scheduled provider appointments Checks blood sugars as instructed and calls care providers for concerns Adheres to prescribed ADA/carb modified Patient Goals: Continue checking your blood sugar twice a day: first thing in the morning before you eat, and again at night after a meal Please continue writing your blood sugars down on paper so we can review these together during our future phone calls Check blood sugar extra during the day if you don't feel normal or feel it is too high or too low Today, you reported a fasting blood sugar of 104 Please continue eating foods that are low in sugar and carbohydrates Take the blood sugars you write down on paper to all doctor visits  Follow Up Plan:  Telephone follow up appointment with care management team member scheduled for: Wednesday January 19, 2021 at 1:00 pm The patient has been provided with contact information for the care management team and has been advised to call with any health related questions or concerns.      Plan: Telephone follow up appointment with care management team member scheduled for:  January 19, 2021 The patient has been provided with contact information for the care management team and has been advised to call with any health related questions or concerns [name], RN, BSN, [place] [phone]: direct office [phone]: mobile

## 2020-12-07 NOTE — Patient Instructions (Signed)
Visit Information  PATIENT GOALS:  Goals Addressed             This Visit's Progress    Monitor and Manage My Blood Sugar-Diabetes Type 2   On track    Timeframe:  Long-Range Goal Priority:  Low Start Date:    11/04/20                         Expected End Date:    11/04/21                   Follow Up Date 01/19/2021    Continue checking your blood sugar twice a day: first thing in the morning before you eat, and again at night after a meal Please continue writing your blood sugars down on paper so we can review these together during our future phone calls Check blood sugar extra during the day if you don't feel normal or feel it is too high or too low Today, you reported a fasting blood sugar of 104 Please continue eating foods that are low in sugar and carbohydrates Take the blood sugars you write down on paper to all doctor visits    Why is this important?   Checking your blood sugar at home helps to keep it from getting very high or very low.  Writing the results in a diary or log helps the doctor know how to care for you.  Your blood sugar log should have the time, date and the results.  Also, write down the amount of insulin or other medicine that you take.  Other information, like what you ate, exercise done and how you were feeling, will also be helpful.          Track and Manage Fluids and Swelling-Heart Failure   On track    Timeframe:  Long-Range Goal Priority:  Medium Start Date:        11/04/20                     Expected End Date:    11/04/21                   Follow Up Date 01/19/2021   Great job weighing yourself daily and write your weights at home down on paper or on the calendar- we will review these each time we have a telephone visit.  Today you reported a weight of 157 lbs Call office if I gain more than 2 pounds in one day or 5 pounds in one week, especially if this is associated with chest tightness, swelling in your legs/ ankles, and new or worsened  shortness of breath Watch for swelling in feet, ankles and legs every day: follow the plan your cardiologist developed to increase your diuretic to 3 times a week if you begin to gain weight or have inccreased swelling in your legs and ankles Keep legs up while sitting Keep limiting the salt in your diet: decrease your salt intake and use salt only in moderation Please read over the attached information- we will review this information periodically during our phone conversations  Why is this important?   It is important to check your weight daily and watch how much salt and liquids you have.  It will help you to manage your heart failure.           Patient verbalizes understanding of instructions provided today and agrees to view in [place].  Telephone follow up appointment with care management team member scheduled for: January 19, 2021 The patient has been provided with contact information for the care management team and has been advised to call with any health related questions or concerns.   [name], RN, BSN, [facility] RN Care Coordination- [place] [phone]: direct office [phone]: mobile

## 2020-12-08 DIAGNOSIS — R2689 Other abnormalities of gait and mobility: Secondary | ICD-10-CM | POA: Diagnosis not present

## 2020-12-08 DIAGNOSIS — I10 Essential (primary) hypertension: Secondary | ICD-10-CM | POA: Diagnosis not present

## 2020-12-08 DIAGNOSIS — N1832 Chronic kidney disease, stage 3b: Secondary | ICD-10-CM | POA: Diagnosis not present

## 2020-12-08 DIAGNOSIS — I5040 Unspecified combined systolic (congestive) and diastolic (congestive) heart failure: Secondary | ICD-10-CM | POA: Diagnosis not present

## 2020-12-08 DIAGNOSIS — M6281 Muscle weakness (generalized): Secondary | ICD-10-CM | POA: Diagnosis not present

## 2020-12-08 DIAGNOSIS — M1A9XX0 Chronic gout, unspecified, without tophus (tophi): Secondary | ICD-10-CM | POA: Diagnosis not present

## 2020-12-08 DIAGNOSIS — E1122 Type 2 diabetes mellitus with diabetic chronic kidney disease: Secondary | ICD-10-CM | POA: Diagnosis not present

## 2020-12-08 DIAGNOSIS — I25119 Atherosclerotic heart disease of native coronary artery with unspecified angina pectoris: Secondary | ICD-10-CM | POA: Diagnosis not present

## 2020-12-09 ENCOUNTER — Telehealth (HOSPITAL_BASED_OUTPATIENT_CLINIC_OR_DEPARTMENT_OTHER): Payer: Self-pay | Admitting: Cardiovascular Disease

## 2020-12-09 NOTE — Telephone Encounter (Signed)
Spoke with patient's wife [name]  DPR) in regards to the PYP (amyloid) study scheduled Friday  12/17/20 at 12:45 pm at the [facility] location --[place], [place] voiced her understanding and I will mail information to patient

## 2020-12-13 DIAGNOSIS — M1A9XX0 Chronic gout, unspecified, without tophus (tophi): Secondary | ICD-10-CM | POA: Diagnosis not present

## 2020-12-13 DIAGNOSIS — I25119 Atherosclerotic heart disease of native coronary artery with unspecified angina pectoris: Secondary | ICD-10-CM | POA: Diagnosis not present

## 2020-12-13 DIAGNOSIS — E1122 Type 2 diabetes mellitus with diabetic chronic kidney disease: Secondary | ICD-10-CM | POA: Diagnosis not present

## 2020-12-13 DIAGNOSIS — R2689 Other abnormalities of gait and mobility: Secondary | ICD-10-CM | POA: Diagnosis not present

## 2020-12-13 DIAGNOSIS — M6281 Muscle weakness (generalized): Secondary | ICD-10-CM | POA: Diagnosis not present

## 2020-12-13 DIAGNOSIS — I10 Essential (primary) hypertension: Secondary | ICD-10-CM | POA: Diagnosis not present

## 2020-12-13 DIAGNOSIS — N1832 Chronic kidney disease, stage 3b: Secondary | ICD-10-CM | POA: Diagnosis not present

## 2020-12-13 DIAGNOSIS — I5040 Unspecified combined systolic (congestive) and diastolic (congestive) heart failure: Secondary | ICD-10-CM | POA: Diagnosis not present

## 2020-12-15 ENCOUNTER — Ambulatory Visit: Payer: Self-pay

## 2020-12-15 DIAGNOSIS — R2689 Other abnormalities of gait and mobility: Secondary | ICD-10-CM | POA: Diagnosis not present

## 2020-12-15 DIAGNOSIS — I25119 Atherosclerotic heart disease of native coronary artery with unspecified angina pectoris: Secondary | ICD-10-CM | POA: Diagnosis not present

## 2020-12-15 DIAGNOSIS — M1A9XX0 Chronic gout, unspecified, without tophus (tophi): Secondary | ICD-10-CM | POA: Diagnosis not present

## 2020-12-15 DIAGNOSIS — I5040 Unspecified combined systolic (congestive) and diastolic (congestive) heart failure: Secondary | ICD-10-CM | POA: Diagnosis not present

## 2020-12-15 DIAGNOSIS — I10 Essential (primary) hypertension: Secondary | ICD-10-CM | POA: Diagnosis not present

## 2020-12-15 DIAGNOSIS — N1832 Chronic kidney disease, stage 3b: Secondary | ICD-10-CM | POA: Diagnosis not present

## 2020-12-15 DIAGNOSIS — E1122 Type 2 diabetes mellitus with diabetic chronic kidney disease: Secondary | ICD-10-CM | POA: Diagnosis not present

## 2020-12-15 DIAGNOSIS — M6281 Muscle weakness (generalized): Secondary | ICD-10-CM | POA: Diagnosis not present

## 2020-12-16 ENCOUNTER — Telehealth: Payer: Self-pay | Admitting: Internal Medicine

## 2020-12-16 NOTE — Telephone Encounter (Signed)
   Spouse requesting letter for patient to be excused permanently from jury duty. DATE:9/19 at [place] [phone]

## 2020-12-16 NOTE — Telephone Encounter (Signed)
Letter printed.

## 2020-12-16 NOTE — Telephone Encounter (Signed)
Spoke with patient's wife today and info mailed out.

## 2020-12-17 ENCOUNTER — Encounter (HOSPITAL_COMMUNITY): Payer: Medicare HMO

## 2020-12-24 ENCOUNTER — Other Ambulatory Visit (HOSPITAL_COMMUNITY): Payer: Self-pay

## 2020-12-24 NOTE — Telephone Encounter (Signed)
Patient wife was calling in about letter for [employer].Marland Kitchen advised her letter was mailed out  Wife stating they never received letter via postal service  Offered to print letter for her to pick up  She requested to have letter mailed out again & if they dont receive it this 2nd time she will req to pick up from office

## 2020-12-24 NOTE — Telephone Encounter (Signed)
Letter reprinted and mailed out today.

## 2020-12-29 ENCOUNTER — Telehealth (HOSPITAL_COMMUNITY): Payer: Self-pay | Admitting: *Deleted

## 2020-12-29 NOTE — Telephone Encounter (Signed)
Close encounter 

## 2020-12-31 ENCOUNTER — Ambulatory Visit (HOSPITAL_COMMUNITY)
Admission: RE | Admit: 2020-12-31 | Discharge: 2020-12-31 | Disposition: A | Discharge status: Home / Self Care | Payer: Medicare HMO | Source: Ambulatory Visit | Attending: Cardiovascular Disease | Admitting: Cardiovascular Disease

## 2020-12-31 ENCOUNTER — Other Ambulatory Visit: Payer: Self-pay

## 2020-12-31 DIAGNOSIS — E859 Amyloidosis, unspecified: Secondary | ICD-10-CM

## 2020-12-31 MED ORDER — TECHNETIUM TC 99M PYROPHOSPHATE
20.7000 | Freq: Once | INTRAVENOUS | Status: AC
  Administered 2020-12-31: 20.7 via INTRAVENOUS

## 2021-01-03 ENCOUNTER — Other Ambulatory Visit: Payer: Self-pay

## 2021-01-03 ENCOUNTER — Other Ambulatory Visit: Payer: Medicare HMO

## 2021-01-03 ENCOUNTER — Other Ambulatory Visit (INDEPENDENT_AMBULATORY_CARE_PROVIDER_SITE_OTHER): Payer: Medicare HMO

## 2021-01-03 ENCOUNTER — Telehealth: Payer: Self-pay | Admitting: Pharmacist

## 2021-01-03 DIAGNOSIS — E1165 Type 2 diabetes mellitus with hyperglycemia: Secondary | ICD-10-CM

## 2021-01-03 LAB — BASIC METABOLIC PANEL
BUN: 44 mg/dL — ABNORMAL HIGH (ref 6–23)
CO2: 26 mEq/L (ref 19–32)
Calcium: 10.3 mg/dL (ref 8.4–10.5)
Chloride: 107 mEq/L (ref 96–112)
Creatinine, Ser: 1.97 mg/dL — ABNORMAL HIGH (ref 0.40–1.50)
GFR: 33.28 mL/min — ABNORMAL LOW (ref >60.00)
Glucose, Bld: 125 mg/dL — ABNORMAL HIGH (ref 70–99)
Potassium: 4.4 mEq/L (ref 3.5–5.1)
Sodium: 140 mEq/L (ref 135–145)

## 2021-01-03 NOTE — Progress Notes (Signed)
    Chronic Care Management Pharmacy Assistant   Name: Corey [name]  MRN: [id number] DOB: August 26, 1945  I called and spoke with Patient's wife and she stated that he has not picked up samples yet and they will come by this week to pick them up. She also stated that they still need assistance getting his Vania Rea because it costs $500 a month. The social worker was trying to help them get it for $45 a month.  Orinda Kenner, [place] Clinical Pharmacists Assistant [phone]  Time Spent: 49

## 2021-01-04 DIAGNOSIS — H7292 Unspecified perforation of tympanic membrane, left ear: Secondary | ICD-10-CM | POA: Diagnosis not present

## 2021-01-04 DIAGNOSIS — H6123 Impacted cerumen, bilateral: Secondary | ICD-10-CM | POA: Insufficient documentation

## 2021-01-04 DIAGNOSIS — H9193 Unspecified hearing loss, bilateral: Secondary | ICD-10-CM | POA: Insufficient documentation

## 2021-01-04 DIAGNOSIS — H9313 Tinnitus, bilateral: Secondary | ICD-10-CM | POA: Diagnosis not present

## 2021-01-05 ENCOUNTER — Ambulatory Visit: Payer: Medicare HMO | Admitting: Endocrinology

## 2021-01-05 LAB — PARATHYROID HORMONE, INTACT (NO CA): PTH: 19 pg/mL (ref 15–65)

## 2021-01-05 LAB — FRUCTOSAMINE: Fructosamine: 338 umol/L — ABNORMAL HIGH (ref 0–285)

## 2021-01-07 ENCOUNTER — Other Ambulatory Visit (HOSPITAL_COMMUNITY): Payer: Self-pay

## 2021-01-07 ENCOUNTER — Other Ambulatory Visit: Payer: Medicare HMO

## 2021-01-11 ENCOUNTER — Ambulatory Visit: Payer: Medicare HMO | Admitting: Endocrinology

## 2021-01-13 ENCOUNTER — Telehealth: Payer: Self-pay | Admitting: Pharmacist

## 2021-01-13 NOTE — Progress Notes (Signed)
Contacted [facility]  to follow up on patient assistance application for Jardiance. Per representative at [facility] cares states patient has been approved starting July 2022 through Dec. 2022. First shipment went out on July 19th, patient will be due for a refill on Oct. 24.    Orinda Kenner, [place] Pharmacists Assistant [phone]  Time Spent: 51

## 2021-01-13 NOTE — Progress Notes (Signed)
Called and spoke with patient wife, she stated that he did received his Jardiance back in July. Patient has been taking two a day since his last visit with Dr. Dwyane Dee on 12/03/20 and he increase his dose. Patient wife stated that they picked up some samples from the office this morning but the patient only has 10 left in the bottles plus the 7 samples and his refill is not due until Oct. 24, 2022. Patient wife also stated she is interested in knowing more about [facility].  Orinda Kenner, [place] Clinical Pharmacists Assistant [phone]  Time Spent: [phone]

## 2021-01-19 ENCOUNTER — Ambulatory Visit (INDEPENDENT_AMBULATORY_CARE_PROVIDER_SITE_OTHER): Payer: Medicare HMO | Admitting: *Deleted

## 2021-01-19 DIAGNOSIS — I5040 Unspecified combined systolic (congestive) and diastolic (congestive) heart failure: Secondary | ICD-10-CM

## 2021-01-19 DIAGNOSIS — E1165 Type 2 diabetes mellitus with hyperglycemia: Secondary | ICD-10-CM

## 2021-01-19 NOTE — Telephone Encounter (Signed)
Dr. Marlou Porch, please see notes below as well. Dr. Dwyane Dee increased pt's medication jardiance to 25 mg tablet. Pt was prescribed jardiance 10 mg tablet. Pharmacist is asking for a new Rx for jardiance 25 mg tablet from you, Dr. Marlou Porch. Would you be willing to prescribed jardiance 25 mg tablets, that Dr. Dwyane Dee increased? Please advise

## 2021-01-19 NOTE — Chronic Care Management (AMB) (Signed)
Chronic Care Management   CCM RN Visit Note  01/19/2021 Name: Corey [name] MRN: [id number] DOB: May 24, 1945  Subjective: Corey [name] is a 75 y.o. year old male who is a primary care patient of Burns, [name], MD. The care management team was consulted for assistance with disease management and care coordination needs.    Engaged with patient and his spouse/ caregiver by telephone for follow up visit in response to provider referral for case management and/or care coordination services.   Consent to Services:  The patient was given information about Chronic Care Management services, agreed to services, and gave verbal consent prior to initiation of services.  Please see initial visit note for detailed documentation.  Patient agreed to services and verbal consent obtained.   Assessment: Review of patient past medical history, allergies, medications, health status, including review of consultants reports, laboratory and other test data, was performed as part of comprehensive evaluation and provision of chronic care management services.   CCM Care Plan Allergies  Allergen Reactions   Hydrochlorothiazide Other (See Comments)    Gout , uncontrolled diabetes and renal insufficiency   Outpatient Encounter Medications as of 01/19/2021  Medication Sig Note   ACCU-CHEK AVIVA PLUS test strip TEST BLOOD SUGAR TWICE DAILY AS DIRECTED    ACCU-CHEK AVIVA PLUS test strip Use as instructed    albuterol (VENTOLIN HFA) 108 (90 Base) MCG/ACT inhaler Inhale 2 puffs into the lungs every 4 (four) hours as needed for wheezing or shortness of breath.    allopurinol (ZYLOPRIM) 100 MG tablet Take 1/2 (one-half) tablet by mouth once daily    aspirin EC 81 MG tablet Take 1 tablet (81 mg total) by mouth daily.    bismuth subsalicylate (PEPTO BISMOL) 262 MG/15ML suspension Take 30 mLs by mouth every 6 (six) hours as needed for indigestion.    carvedilol (COREG) 6.25 MG tablet Take 1 tablet (6.25 mg total) by mouth 2  (two) times daily with a meal.    colchicine (COLCRYS) 0.6 MG tablet Take 2 tabs once and then one tab one hour later as needed for gout    empagliflozin (JARDIANCE) 10 MG TABS tablet Take 1 tablet (10 mg total) by mouth daily before breakfast. 12/07/2020: 12/07/20: Caregiver reports this medication was increased 20 mg po QD during recent endocrinology provider appointment 12/03/20- reports patient has increased dose    hydrALAZINE (APRESOLINE) 50 MG tablet Take 2 tablets (100 mg total) by mouth 3 (three) times daily.    isosorbide mononitrate (IMDUR) 60 MG 24 hr tablet Take 2 tablets (120 mg total) by mouth daily.    isosorbide mononitrate (IMDUR) 60 MG 24 hr tablet Take 60 mg by mouth in the morning and at bedtime.    nitroGLYCERIN (NITROSTAT) 0.4 MG SL tablet Place 1 tablet (0.4 mg total) under the tongue every 5 (five) minutes as needed for chest pain.    rosuvastatin (CRESTOR) 40 MG tablet Take 1 tablet (40 mg total) by mouth daily.    torsemide (DEMADEX) 20 MG tablet Take 1 tablet (20 mg total) by mouth daily.    torsemide (DEMADEX) 20 MG tablet Take 1 tablet (20 mg total) by mouth 2 (two) times a week. Mondays and Fridays    No facility-administered encounter medications on file as of 01/19/2021.   Patient Active Problem List   Diagnosis Date Noted   Physical deconditioning 10/16/2020   Cardiac amyloidosis ([place]) 09/30/2020   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   Progressive angina (HCC) - Atypical;  DOE 09/20/2020   Coronary artery disease involving native coronary artery of native heart with angina pectoris ([place]) 09/20/2020   Acute on chronic combined systolic and diastolic CHF (congestive heart failure) ([place]) 09/20/2020   Acute combined systolic and diastolic heart failure ([place]) 09/20/2020   Soft tissue mass 07/30/2020   Contracture of joint of finger of left hand 07/30/2020   Acute respiratory disease due to COVID-19 virus 06/05/2019   DOE (dyspnea on exertion) 03/01/2019   Mild  cognitive impairment 12/05/2018   Slurred speech 07/23/2018   Muscle twitching 07/23/2018   OSA (obstructive sleep apnea) 03/27/2018   Snores 01/21/2018   Poor balance 01/23/2017   Ankle swelling, left 09/06/2016   Bradycardia 01/28/2016   Weak urinary stream 12/31/2015   Stage 3b chronic kidney disease 09/15/2015   Ventral hernia without obstruction or gangrene 09/15/2015   Prostate cancer ([place]) 07/25/2015   Erectile dysfunction 05/04/2015   Urinary frequency 05/04/2015   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   Abnormal finding on MRI of brain 02/14/2015   PEA (Pulseless electrical activity) ([place]) 12/03/2012   Diabetes ([place]) 06/30/2011   Gout 03/05/2007   Essential hypertension 03/05/2007   Conditions to be addressed/monitored:  CHF and DMII  Care Plan : Heart Failure (Adult)  Updates made by Knox Royalty, RN since 01/19/2021 12:00 AM     Problem: Symptom Exacerbation (Heart Failure)   Priority: Medium     Long-Range Goal: Symptom Exacerbation Prevented or Minimized   Start Date: 11/04/2020  Expected End Date: 11/04/2021  This Visit's Progress: On track  Recent Progress: On track  Priority: Medium  Note:   Current Barriers:  Knowledge deficit related to basic heart failure pathophysiology and self care management Unable to independently manage medications: reports decreased vision due to glaucoma Limited health literacy, hard of hearing in (L) ear: wife participates in all of patent medical affairs and is very supportive/ helpful Case Manager Clinical Goal(s):  11/04/20: Over the next 12 months, patient will verbalize understanding of Heart Failure Action Plan and when to call doctor, as evidenced by patient/ caregiver reporting during CCM RN CM outreach of: Completion of daily weight monitoring/ recording (notifying MD of 3 lb weight gain over night or 5 lb in a week) Adhering to low salt diet Taking prescribed medications as  prescribed Interventions:  Collaboration with [name], MD regarding development and update of comprehensive plan of care as evidenced by provider attestation and co-signature Inter-disciplinary care team collaboration (see longitudinal plan of care Chart reviewed including relevant office notes, upcoming scheduled appointments, and lab results Discussed current  clinical condition with patient/ caregiver and confirmed no current clinical or medication concerns; confirmed patient's spouse continues to manage medications for patient due to decreased vision Patient confirms continues to follow low salt, heart healthy diet; caregiver- spouse prepares all meals separate form rest of family Confirmed continues to perform daily weight monitoring/ recording at home: reports consistent ranges between 150-160; reports weights today as "158 lbs;" denies overnight weight gain > 3 lbs/ weekly > 5 lbs Reports lower extremity swelling "at baseline;" "not bad;" denies new/ recent falls Caregiver/ spouse gwen has questions about 2 different pill bottles she has at home for hydralazine: one is 50 mg pills, the other is 100 mg pills-- provided clarification to caregiver of dosing: he should be taking 100 mg po TID: (2) at a time from 50 mg bottle; (1) at a time from 100 mg bottle- she verbalizes understanding after several repetitions/  writing down on paper Reviewed upcoming provider appointments with patient/ spouse: 01/25/21- podiatry; 02/04/21- endocrinology lab; 02/08/21- endocrinology provider; 02/17/21- CCM pharmacy team; 04/04/21- cardiology; wife continues to provide transportation- however, she has been dealing with terrible pain from carpal tunnel-- hoping this will get better Reinforced previously provided education with patient regarding signs/ symptoms yellow CHF zone along with corresponding action plan- patient requires ongoing reinforcement; wife verbalizes good understanding of same Self-Care  Activities: Patient verbalizes understanding of plan to continue monitoring/ recording daily weights at home Spouse administers medications as prescribed, due to patient vision issues Attends all scheduled provider appointments Calls pharmacy for medication refills Performs ADL's/ iADL's independently Calls provider office for new concerns or questions Patient Goals: Doristine Devoid job weighing yourself daily and write your weights at home down on paper or on the calendar- we will review these each time we have a telephone visit.  Today you reported a weight of 158 lbs Call office if I gain more than 2 pounds in one day or 5 pounds in one week, especially if this is associated with chest tightness, swelling in your legs/ ankles, and new or worsened shortness of breath Watch for swelling in feet, ankles and legs every day: follow the plan your cardiologist developed to increase your diuretic to 3 times a week if you begin to gain weight or have inccreased swelling in your legs and ankles Keep legs up while sitting Keep limiting the salt in your diet: decrease your salt intake and use salt only in moderation Follow Up Plan:  Telephone follow up appointment with care management team member scheduled for: Wednesday, March 23, 2021 at 1:00 pm The patient has been provided with contact information for the care management team and has been advised to call with any health related questions or concerns.      Care Plan : Diabetes Type 2 (Adult)  Updates made by Knox Royalty, RN since 01/19/2021 12:00 AM     Problem: Glycemic Management (Diabetes, Type 2)   Priority: Low     Long-Range Goal: Glycemic Management Optimized   Start Date: 11/04/2020  Expected End Date: 11/04/2021  This Visit's Progress: On track  Recent Progress: On track  Priority: Low  Note:   Objective:  Lab Results  Component Value Date   HGBA1C 6.4 (H) 09/21/2020   Lab Results  Component Value Date   CREATININE 2.29 (H)  10/27/2020   CREATININE 2.67 (H) 10/08/2020   CREATININE 2.34 (H) 09/30/2020   Lab Results  Component Value Date   EGFR 42 (L) 09/15/2020   Current Barriers:  Knowledge Deficits related to basic Diabetes pathophysiology and self care/management: patient and spouse could benefit from ongoing reinforcement of established knowledge baseline Difficulty obtaining or cannot afford medications- reports difficult to afford [facility] team active/ aware of same Unable to self administer medications as prescribed- reports wife manages medications due to poor vision Case Manager Clinical Goal(s):  11/04/20: Over the next 12 months, patient will demonstrate improved adherence to prescribed treatment plan for diabetes self care/management as evidenced by patient and careiver reporting during CCM RN CM outreach of:  Daily monitoring and recording of CBG   Adherence to ADA/ carb modified diet  Adherence to prescribed medication regimen  Contacting provider for new or worsened symptoms or questions Interventions:  Collaboration with [name], MD regarding development and update of comprehensive plan of care as evidenced by provider attestation and co-signature Inter-disciplinary care team collaboration (see longitudinal plan of care)  Review of patient status, including review of consultants reports, relevant laboratory and other test results, and medications completed Discussed current clinical condition with patient and spouse and confirmed no current clinical concerns Confirmed patient/ caregiver/ spouse have been working with CCM Pharmacist for [employer] for [employer]- encouraged their ongoing engagement with CCM pharmacy team Reviewed recent blood sugars at home with patient- he reports consistent fasting and post-prandial ranges between 100-110: positive reinforcement provided Continues trying to follow diabetic, low carb, sugar, salt diet: struggles occasionally because he can't eat what  others in the family do; however, wife is helping patient adjust and keep in perspective benefit of following this diet Reinforced previously provided education around self-health management of DM, importance of maintenance activities at home, adherence to medications and diet Reviewed scheduled/upcoming provider appointments including: Dr. Dwyane Dee, endocrinology provider on 02/04/21 Discussed plans with patient for ongoing care management follow up and provided patient with direct contact information for care management team Self-Care Activities UNABLE to independently manage medications at home: Patient's spouse manages medications; they would like information on patient assistance resources for Jardiance Attends all scheduled provider appointments Checks blood sugars as instructed and calls care providers for concerns Adheres to prescribed ADA/carb modified Patient Goals: Continue checking your blood sugar twice a day: first thing in the morning before you eat, and again at night after a meal Please continue writing your blood sugars down on paper so we can review these together during our future phone calls Check blood sugar extra during the day if you don't feel normal or feel it is too high or too low Today, you reported a fasting and after meal blood sugars of 100-110: these are right on target and where the blood sugars should be Please continue eating foods that are low in sugar and carbohydrates Take the blood sugars you write down on paper to all doctor visits- your doctors will be able to make sure you are on the right medications if they are able to see your blood sugars at home  Follow Up Plan:  Telephone follow up appointment with care management team member scheduled for: Wednesday March 23, 2021 at 1:00 pm The patient has been provided with contact information for the care management team and has been advised to call with any health related questions or concerns.       Plan: Telephone follow up appointment with care management team member scheduled for:  Wednesday, March 23, 2021 at 1:00 pm The patient has been provided with contact information for the care management team and has been advised to call with any health related questions or concerns  [name], RN, BSN, [place] [phone]: direct office [phone]: mobile

## 2021-01-19 NOTE — Telephone Encounter (Addendum)
Verified Jardiance 10 mg was approved through [employer] (via cardiology office, Dr Marlou Porch) and patient received initial order in late July. He has been taking 2 tablets daily per instructions from Dr Dwyane Dee starting 12/03/20, so he has run out of his supply well before the next delivery can be shipped 03/07/21.  [facility] will need a new Rx for Jardiance 25 mg in order to ship a new supply to the patient.

## 2021-01-19 NOTE — Patient Instructions (Signed)
Visit Information  Corey [name] and Corey [name], it was nice talking with you today.    I look forward to talking to you again for an update on Wednesday, March 23, 2021 at 1:00 pm; please be listening out for my call that day.  I will call as close to 1:00 pm as possible.   If you need to cancel or re-schedule our telephone visit, please call [phone] and one of our care guides will be happy to assist you.   I look forward to hearing about your progress.   Please don't hesitate to contact me if I can be of assistance to you before our next scheduled telephone appointment.   [name], RN, BSN, [facility] RN Care Coordination- [place] [phone]: direct office [phone]: mobile    PATIENT GOALS:  Goals Addressed             This Visit's Progress    Monitor and Manage My Blood Sugar-Diabetes Type 2   On track    Timeframe:  Long-Range Goal Priority:  Low Start Date:    11/04/20                         Expected End Date:    11/04/21                   Follow Up Date 03/23/2021    Continue checking your blood sugar twice a day: first thing in the morning before you eat, and again at night after a meal Please continue writing your blood sugars down on paper so we can review these together during our future phone calls Check blood sugar extra during the day if you don't feel normal or feel it is too high or too low Today, you reported a fasting and after meal blood sugars of 100-110: these are right on target and where the blood sugars should be Please continue eating foods that are low in sugar and carbohydrates Take the blood sugars you write down on paper to all doctor visits- your doctors will be able to make sure you are on the right medications if they are able to see your blood sugars at home    Why is this important?   Checking your blood sugar at home helps to keep it from getting very high or very low.  Writing the results in a diary or  log helps the doctor know how to care for you.  Your blood sugar log should have the time, date and the results.  Also, write down the amount of insulin or other medicine that you take.  Other information, like what you ate, exercise done and how you were feeling, will also be helpful.          Track and Manage Fluids and Swelling-Heart Failure   On track    Timeframe:  Long-Range Goal Priority:  Medium Start Date:        11/04/20                     Expected End Date:    11/04/21                   Follow Up Date 03/23/2021   Great job weighing yourself daily and write your weights at home down on paper or on the calendar- we will review these each time we have a telephone visit.  Today you reported a  weight of 158 lbs Call office if I gain more than 2 pounds in one day or 5 pounds in one week, especially if this is associated with chest tightness, swelling in your legs/ ankles, and new or worsened shortness of breath Watch for swelling in feet, ankles and legs every day: follow the plan your cardiologist developed to increase your diuretic to 3 times a week if you begin to gain weight or have inccreased swelling in your legs and ankles Keep legs up while sitting Keep limiting the salt in your diet: decrease your salt intake and use salt only in moderation  Why is this important?   It is important to check your weight daily and watch how much salt and liquids you have.  It will help you to manage your heart failure.           Living With Heart Failure Heart failure is a long-term (chronic) condition in which the heart cannot pump enough blood through the body. When this happens, parts of the body do not get the blood and oxygen they need. There is no cure for heart failure at this time, so it is important for you to take good care of yourself and follow the treatment plan you set with your health care provider. If you are living with heart failure, there are ways to help you manage the  disease. How to manage lifestyle changes Living with heart failure requires you to make changes in your life. Your health care team will teach you about the changes you need to make in order to relieve your symptoms and lower your risk of going to the hospital. Work with your health care provider to develop a treatment plan that works for you. Activity Ask your health care provider about attending cardiac rehabilitation. These programs include aerobic physical activity, which provides many benefits for your heart. If no cardiac rehabilitation program is available, ask your health care provider what aerobic exercises are safe for you to do. Return to your normal activities as told by your health care provider. Ask your health care provider what activities are safe for you. Pace your daily activities and allow time for rest as needed. Managing stress It is normal to have many emotions about your diagnosis, such as fear, sadness, anger, and loss. If you feel any of these emotions and need help coping, contact your health care provider. Here are some ways to help yourself manage these emotions: Talk to friends and family members about your condition. They can give you support and guidance. Explain your symptoms to them and, if comfortable, invite them to attend appointments or rehabilitation with you. Join a support group for people with chronic heart failure. Talking with other people who have the same symptoms may give you new ways of coping with your disease and your emotions. Accept help from others. Do not be ashamed if you need help with certain tasks. Use stress management techniques, such as meditation, breathing exercises, or listening to relaxing music. Conditions such as depression and anxiety are common in persons with heart failure. Pay attention to changes in your mood, emotions, and stress levels. Tell your health care provider if you have any of the following symptoms: Trouble sleeping or  a change in your sleeping patterns. Feeling sad, down, or depressed more often than not, every day for more than 2 weeks. Losing interest in activities you normally enjoy. Feeling irritable or crying for no reason. Finding yourself worrying about the future often. Work  You may need to develop a plan with your health care provider if heart failure interferes with your ability to work. This may include: Reducing your work hours. Finding functions that are less active or require less effort. Planning rest periods during your work hours. Travel Talk with your health care provider if you plan to travel. There may be circumstances in which your health care provider recommends that you do not travel or that you delay travel until your condition is under control. When you travel, bring your medicine and a list of your medicines. If you are traveling by air, keep your medicines with you in a carry-on bag. Consider finding a medical facility in the area you will be traveling to and determine what your health insurance will cover. If you will be traveling by public transportation (airplane, train, bus), contact the company prior to traveling if you have special needs. This may include needs related to diet, oxygen, a wheelchair, a seating request, or help with luggage. If you use oxygen, make sure to bring enough oxygen with you. If you have a battery-powered device, bring a fully charged extra battery with you. If you have a device, bring a note from your health care provider and inform all security screening personnel that you have the device. You may need to go through special screening for safety. Sexual activity  Ask your health care provider when it is safe for you to resume sexual activity. You may need to start slowly and gradually increase intimacy. You can increase intimacy by doing such things as caressing, touching, and holding each other. Get regular exercise as told by your health care  provider. This can benefit your sex life by building strength and endurance. Sleep If your condition interferes with your sleep, find ways to improve your sleep quality, such as: Sleep lying on your side, or sleep with your head elevated by raising the head of your bed or using multiple pillows. Ask your health care provider about screening for sleep apnea. Try to go to sleep and wake up at the same times every day. Sleep in a dark, cool room. Do not do any physical activity or eating for a few hours before bedtime. Plan rest periods during the day, but do not take long naps during the day.  Where to find support Consider talking with: Family members. Close friends. A mental health professional or therapist. A member of your church, faith, or community group. Other sources of support include: Local support groups. Ask your health care provider about groups near you. Online support groups, such as those found through the American Heart Association: supportnetwork.heart.org Local home care agencies, community agencies, or social agencies. A palliative care specialist. Palliative care can help you manage symptoms, promote comfort, improve quality of life, and maintain dignity. Where to find more information American Heart Association: heart.org National Heart, Lung, and Blood Institute: https://www.hartman-hill.biz/ Centers for Disease Control and Prevention: https://www.reeves.com/ [place]: SolutionApps.it Contact a health care provider if: You have a rapid weight gain. You have increasing shortness of breath that is unusual for you. You are unable to participate in your usual physical activities. You tire easily. You have difficulty sleeping, such as: You wake up feeling short of breath. You have to use more pillows to raise your head in order to sleep. You cough more than normal, especially with physical activity. You have any swelling or more swelling in areas such as your  hands, feet, ankles, or abdomen. You  become dizzy or light-headed when you stand up. You have changes to your appetite. You have symptoms of depression or anxiety. Get help right away if: You have difficulty breathing. You notice or your family notices a change in your awareness, such as having trouble staying awake or having difficulty with concentration. You have pain or discomfort in your chest. You have an episode of fainting (syncope). You feel like your heart is beating quickly (palpitations). You have extreme feelings of sadness or loss of hope, or you have thoughts about hurting yourself or others. These symptoms may represent a serious problem that is an emergency. Do not wait to see if the symptoms will go away. Get medical help right away. Call your local emergency services (911 in the [place] Do not drive yourself to the hospital. Summary There is no cure for heart failure, so it is important for you to take good care of yourself and follow the treatment plan set by your health care provider. Ask your health care provider about attending cardiac rehabilitation. These programs include aerobic physical activity, which provides many benefits for your heart. It is normal to have many emotions about your diagnosis, such as fear, sadness, anger, and loss. If you feel any of these emotions and need help coping, contact your health care provider. You may need to develop a plan with your health care provider if heart failure interferes with your ability to work. This information is not intended to replace advice given to you by your health care provider. Make sure you discuss any questions you have with your health care provider. Document Revised: 12/15/2019 Document Reviewed: 12/15/2019 Elsevier Patient Education  2022 [place].  Patient verbalizes understanding of instructions provided today and agrees to view in [place].  Telephone follow up appointment with care management team  member scheduled for:  Wednesday, March 23, 2021 at 1:00 pm The patient has been provided with contact information for the care management team and has been advised to call with any health related questions or concerns.   [name], RN, BSN, [facility] RN Care Coordination- [place] [phone]: direct office [phone]: mobile

## 2021-01-24 MED ORDER — EMPAGLIFLOZIN 25 MG PO TABS: 25.0000 mg | ORAL_TABLET | Freq: Every day | ORAL | 3 refills | Status: DC

## 2021-01-24 NOTE — Telephone Encounter (Signed)
Per Dr. Marlou Porch, pt was ordered Empagliflozin (jardiance) 25 mg tablets and faxed to [facility] at [phone]. Confirmation received.

## 2021-01-24 NOTE — Addendum Note (Signed)
Addended by: Carter Kitten D on: 01/24/2021 04:39 PM   Modules accepted: Orders

## 2021-01-25 ENCOUNTER — Ambulatory Visit: Payer: Medicare HMO | Admitting: Podiatry

## 2021-01-25 ENCOUNTER — Other Ambulatory Visit: Payer: Self-pay

## 2021-01-25 DIAGNOSIS — M79675 Pain in left toe(s): Secondary | ICD-10-CM

## 2021-01-25 DIAGNOSIS — N1832 Chronic kidney disease, stage 3b: Secondary | ICD-10-CM | POA: Diagnosis not present

## 2021-01-25 DIAGNOSIS — B351 Tinea unguium: Secondary | ICD-10-CM

## 2021-01-25 DIAGNOSIS — E0822 Diabetes mellitus due to underlying condition with diabetic chronic kidney disease: Secondary | ICD-10-CM | POA: Diagnosis not present

## 2021-01-25 DIAGNOSIS — M79674 Pain in right toe(s): Secondary | ICD-10-CM

## 2021-01-25 DIAGNOSIS — L84 Corns and callosities: Secondary | ICD-10-CM | POA: Diagnosis not present

## 2021-01-30 ENCOUNTER — Encounter: Payer: Self-pay | Admitting: Podiatry

## 2021-01-30 NOTE — Progress Notes (Signed)
  Subjective:  Patient ID: Corey [name], male    DOB: 1945-12-02,  MRN: [id number]  75 y.o. male presents at risk foot care. Pt has h/o NIDDM with chronic kidney disease and corn(s) left 2nd toe and painful thick toenails that are difficult to trim. Painful toenails interfere with ambulation. Aggravating factors include wearing enclosed shoe gear. Pain is relieved with periodic professional debridement. Painful corns are aggravated when weightbearing when wearing enclosed shoe gear. Pain is relieved with periodic professional debridement.  Patient states blood glucose was 104 mg/dl today.  His wife is present during today's visit. They have been unsuccessful in getting his diabetic shoe certification form completed to obtain his shoes.  He voices no new pedal concerns on today's visit.  PCP is [name], MD , and last visit was 11/16/2020.  Allergies  Allergen Reactions   Hydrochlorothiazide Other (See Comments)    Gout , uncontrolled diabetes and renal insufficiency    Review of Systems: Negative except as noted in the HPI.   Objective:  Vascular Examination: Vascular status intact b/l with faintly palpable pedal pulses. CFT <3 seconds b/l. No edema noted b/l. No pain with calf compression b/l. Skin temperature gradient WNL b/l. Pedal hair sparse b/l.  Neurological Examination: Sensation grossly decreased b/l with 10 gram monofilament. Vibratory sensation decreased b/l.   Dermatological Examination: Pedal skin with normal turgor, texture and tone b/l. Toenails 1-5 b/l thick, discolored, elongated with subungual debris and pain on dorsal palpation.  Porokeratotic lesion(s) L 2nd toe. No erythema, no edema, no drainage, no fluctuance.  Musculoskeletal Examination: Muscle strength 5/5 to b/l LE. Moderate HAV with bunion deformity b/l. Semi-rigid hammertoe deformity b/l 2nd digits  Radiographs: None  Hemoglobin A1C Latest Ref Rng & Units 11/23/2020 09/21/2020 09/06/2020 06/02/2020  03/01/2020  HGBA1C 4.6 - 6.5 % 7.2(H) 6.4(H) 6.4 6.3 6.7(H)  Some recent data might be hidden    Assessment:   1. Pain due to onychomycosis of toenails of both feet   2. Corns   3. Diabetes mellitus due to underlying condition with stage 3b chronic kidney disease, without long-term current use of insulin (HCC)     Plan:  -Continue shoes with high toe box or stretchable uppers to prevent rubbing of hammertoes/development of dorsal corn formation. -Continue diabetic foot care principles: inspect feet daily, monitor glucose as recommended by PCP and/or Endocrinologist, and follow prescribed diet per PCP, Endocrinologist and/or dietician. -Wife discussed diabetic shoe certification form with Ferrel Logan in our Orthotis/Prosthetics Department. They were given updated form. He does have qualifying diagnoses for diabetic shoes. -Patient to continue soft, supportive shoe gear daily. -Toenails 1-5 b/l were debrided in length and girth with sterile nail nippers and dremel without iatrogenic bleeding.  -Corn(s) L 2nd toe pared utilizing sterile scalpel blade without complication or incident. Total number debrided=1. -Patient to report any pedal injuries to medical professional immediately. -Continue silicone toe caps to afftected digits daily for protection. -Patient/POA to call should there be question/concern in the interim.  Return in about 3 months (around 04/26/2021).  Marzetta Board, DPM

## 2021-01-31 ENCOUNTER — Other Ambulatory Visit: Payer: Self-pay | Admitting: Endocrinology

## 2021-01-31 DIAGNOSIS — E1165 Type 2 diabetes mellitus with hyperglycemia: Secondary | ICD-10-CM

## 2021-02-01 ENCOUNTER — Telehealth: Payer: Self-pay | Admitting: Cardiovascular Disease

## 2021-02-01 ENCOUNTER — Encounter: Payer: Self-pay | Admitting: Cardiovascular Disease

## 2021-02-01 NOTE — Telephone Encounter (Signed)
Patient's wife states the patient received a DNA sample collector in the mail. She states the patient had to "spit into a tube." She would like to know where she needs to put it.

## 2021-02-01 NOTE — Telephone Encounter (Signed)
Called patient wife, advised that it came via fedex, I advised her to take it a fedex drop off and they would get it sent in.  Patient wife verbalized understanding.  Thankful for call back.

## 2021-02-02 DIAGNOSIS — H903 Sensorineural hearing loss, bilateral: Secondary | ICD-10-CM | POA: Diagnosis not present

## 2021-02-04 ENCOUNTER — Other Ambulatory Visit: Payer: Medicare HMO

## 2021-02-07 ENCOUNTER — Other Ambulatory Visit (INDEPENDENT_AMBULATORY_CARE_PROVIDER_SITE_OTHER): Payer: Medicare HMO

## 2021-02-07 ENCOUNTER — Other Ambulatory Visit: Payer: Medicare HMO

## 2021-02-07 ENCOUNTER — Ambulatory Visit: Payer: Medicare HMO | Admitting: Internal Medicine

## 2021-02-07 DIAGNOSIS — E1165 Type 2 diabetes mellitus with hyperglycemia: Secondary | ICD-10-CM | POA: Diagnosis not present

## 2021-02-07 LAB — COMPREHENSIVE METABOLIC PANEL
ALT: 51 U/L (ref 0–53)
AST: 45 U/L — ABNORMAL HIGH (ref 0–37)
Albumin: 3.7 g/dL (ref 3.5–5.2)
Alkaline Phosphatase: 90 U/L (ref 39–117)
BUN: 37 mg/dL — ABNORMAL HIGH (ref 6–23)
CO2: 24 mEq/L (ref 19–32)
Calcium: 9.8 mg/dL (ref 8.4–10.5)
Chloride: 111 mEq/L (ref 96–112)
Creatinine, Ser: 2.07 mg/dL — ABNORMAL HIGH (ref 0.40–1.50)
GFR: 31.34 mL/min — ABNORMAL LOW (ref >60.00)
Glucose, Bld: 104 mg/dL — ABNORMAL HIGH (ref 70–99)
Potassium: 4.3 mEq/L (ref 3.5–5.1)
Sodium: 142 mEq/L (ref 135–145)
Total Bilirubin: 0.7 mg/dL (ref 0.2–1.2)
Total Protein: 6.5 g/dL (ref 6.0–8.3)

## 2021-02-07 LAB — HEMOGLOBIN A1C: Hgb A1c MFr Bld: 6.8 % — ABNORMAL HIGH (ref 4.6–6.5)

## 2021-02-08 ENCOUNTER — Other Ambulatory Visit: Payer: Self-pay

## 2021-02-08 ENCOUNTER — Telehealth: Payer: Self-pay | Admitting: Cardiology

## 2021-02-08 ENCOUNTER — Ambulatory Visit (INDEPENDENT_AMBULATORY_CARE_PROVIDER_SITE_OTHER): Payer: Medicare HMO | Admitting: Endocrinology

## 2021-02-08 VITALS — BP 142/78 | HR 53 | Ht 66.0 in | Wt 165.0 lb

## 2021-02-08 DIAGNOSIS — I10 Essential (primary) hypertension: Secondary | ICD-10-CM | POA: Diagnosis not present

## 2021-02-08 DIAGNOSIS — N1832 Chronic kidney disease, stage 3b: Secondary | ICD-10-CM | POA: Diagnosis not present

## 2021-02-08 DIAGNOSIS — E1165 Type 2 diabetes mellitus with hyperglycemia: Secondary | ICD-10-CM

## 2021-02-08 DIAGNOSIS — Z23 Encounter for immunization: Secondary | ICD-10-CM

## 2021-02-08 NOTE — Telephone Encounter (Signed)
Elayne Snare, MD   Physician  Specialty:  Endocrinology  Patient Instructions  Signed  Encounter Date:  02/08/2021           Signed              Take 1/2 Glimeperide daily         Spoke with pt's wife regarding Glimepiride.  Wife states pt has not been trying this medication since he was in the hospital last time.  She reports it was not on his medication list at discharge.  Reviewed discharge medication list from 09/2020 and wife is correct, Glimepiride was not on his medication list.  It does re-appear on his medication list on 7/27 by a historical provider.  Pt was seen today by Dr Dwyane Dee who instructed patient to take 1/2 tablet daily (as noted above).  Advised wife to follow these instructions, monitor pt's BS and if there are any concerns to contact Dr Dwyane Dee for further instructions.  She states understanding, is in agreement and had no further questions at the time of the call.

## 2021-02-08 NOTE — Patient Instructions (Signed)
Take 1/2 Glimeperide daily

## 2021-02-08 NOTE — Telephone Encounter (Signed)
New Message:     Patient  have been in the hospital. His wife wants to know if he still supposed to be taking his Glimepiride?

## 2021-02-08 NOTE — Progress Notes (Signed)
Patient ID: Corey [name], male   DOB: 14-Dec-1945, 75 y.o.   MRN: [id number]   Reason for Appointment : Followup of various problems  History of Present Illness          Diagnosis: Type 2 diabetes mellitus, date of diagnosis: 1997      Past history: The patient was initially treated with various oral hypoglycemic drugs and details are not available. About 2009 he was started on insulin and has either taken premixed insulin or Lantus once a day He had difficulty affording his Lantus insulin and had preferred to take premixed insulin Subsequently insulin was tapered off  Recent history:    Oral hypoglycemic drugs:   Jardiance 25 mg daily, Amaryl 1 mg daily  Current blood sugar patterns and problems:  His A1c is 6.8 compared to 7.2  Previous fructosamine was 338  His Jardiance was increased in July and he is taking 25 mg without side effects Previously unable to assess his blood sugars well since they were being monitored with a generic meter which not appear to have the right date and possibly time programmed Now using Accu-Chek Although he was supposed to be off Amaryl that was stopped in 5/22 he appears to be taking this now  Cannot take metformin because of renal dysfunction In the last month or so his blood sugars have been excellent with median reading only 112 No hypoglycemia His weight is gradually going up, previously had lost weight from diuresis    Glucose monitoring:  done less than 1 time a day         Glucometer:  Accu-Chek       Blood Glucose readings from download   PRE-MEAL Fasting Lunch Dinner Bedtime Overall  Glucose range:     86-185  Mean/median: 118       POST-MEAL PC Breakfast PC Lunch PC Dinner  Glucose range:   87-185  Mean/median:   113      Self-care:       Meals: 2-3 meals per day. Egg in am at 9-10 am or grits, rarely sandwich at lunch. His largest meal is at suppertime at about 8 pm.         Dietician visit: Most recent:  02/2014               Compliance with the medical regimen:  Fair.    Wt Readings from Last 3 Encounters:  02/08/21 165 lb (74.8 kg)  12/03/20 159 lb 12.8 oz (72.5 kg)  11/24/20 158 lb (71.7 kg)    Lab Results  Component Value Date   HGBA1C 6.8 (H) 02/07/2021   HGBA1C 7.2 (H) 11/23/2020   HGBA1C 6.4 (H) 09/21/2020    Lab Results  Component Value Date   MICROALBUR 8.5 (H) 11/23/2020   LDLCALC 34 11/23/2020   CREATININE 2.07 (H) 02/07/2021   Lab Results  Component Value Date   FRUCTOSAMINE 338 (H) 01/03/2021   FRUCTOSAMINE 287 (H) 03/01/2020   FRUCTOSAMINE 320 (H) 11/26/2019    Appointment on 02/07/2021  Component Date Value Ref Range Status   Sodium 02/07/2021 142  135 - 145 mEq/L Final   Potassium 02/07/2021 4.3  3.5 - 5.1 mEq/L Final   Chloride 02/07/2021 111  96 - 112 mEq/L Final   CO2 02/07/2021 24  19 - 32 mEq/L Final   Glucose, Bld 02/07/2021 104 (A) 70 - 99 mg/dL Final   BUN 02/07/2021 37 (A) 6 - 23 mg/dL Final   Creatinine, Ser  02/07/2021 2.07 (A) 0.40 - 1.50 mg/dL Final   Total Bilirubin 02/07/2021 0.7  0.2 - 1.2 mg/dL Final   Alkaline Phosphatase 02/07/2021 90  39 - 117 U/L Final   AST 02/07/2021 45 (A) 0 - 37 U/L Final   ALT 02/07/2021 51  0 - 53 U/L Final   Total Protein 02/07/2021 6.5  6.0 - 8.3 g/dL Final   Albumin 02/07/2021 3.7  3.5 - 5.2 g/dL Final   GFR 02/07/2021 31.34 (A) >60.00 mL/min Final   Calculated using the CKD-EPI Creatinine Equation (2021)   Calcium 02/07/2021 9.8  8.4 - 10.5 mg/dL Final   Hgb A1c MFr Bld 02/07/2021 6.8 (A) 4.6 - 6.5 % Final   Glycemic Control Guidelines for People with Diabetes:Non Diabetic:  <6%Goal of Therapy: <7%Additional Action Suggested:  >8%    Other active medical problems addressed: See review of systems   Allergies as of 02/08/2021       Reactions   Hydrochlorothiazide Other (See Comments)   Gout , uncontrolled diabetes and renal insufficiency        Medication List        Accurate as of February 08, 2021  3:55 PM. If you have any questions, ask your nurse or doctor.          Accu-Chek Aviva Plus test strip Generic drug: glucose blood Use as instructed   Accu-Chek Aviva Plus test strip Generic drug: glucose blood TEST BLOOD SUGAR TWICE DAILY AS DIRECTED   albuterol 108 (90 Base) MCG/ACT inhaler Commonly known as: VENTOLIN HFA Inhale 2 puffs into the lungs every 4 (four) hours as needed for wheezing or shortness of breath.   allopurinol 100 MG tablet Commonly known as: ZYLOPRIM Take 1/2 (one-half) tablet by mouth once daily   aspirin EC 81 MG tablet Take 1 tablet (81 mg total) by mouth daily.   bismuth subsalicylate 175 ZW/25EN suspension Commonly known as: PEPTO BISMOL Take 30 mLs by mouth every 6 (six) hours as needed for indigestion.   carvedilol 6.25 MG tablet Commonly known as: COREG Take 1 tablet (6.25 mg total) by mouth 2 (two) times daily with a meal.   colchicine 0.6 MG tablet Commonly known as: Colcrys Take 2 tabs once and then one tab one hour later as needed for gout   empagliflozin 25 MG Tabs tablet Commonly known as: JARDIANCE Take 1 tablet (25 mg total) by mouth daily before breakfast.   glimepiride 1 MG tablet Commonly known as: AMARYL   hydrALAZINE 50 MG tablet Commonly known as: APRESOLINE Take 2 tablets (100 mg total) by mouth 3 (three) times daily.   isosorbide mononitrate 60 MG 24 hr tablet Commonly known as: IMDUR Take 60 mg by mouth in the morning and at bedtime.   isosorbide mononitrate 60 MG 24 hr tablet Commonly known as: IMDUR Take 2 tablets (120 mg total) by mouth daily.   losartan 100 MG tablet Commonly known as: COZAAR   metFORMIN 500 MG tablet Commonly known as: GLUCOPHAGE   nitroGLYCERIN 0.4 MG SL tablet Commonly known as: Nitrostat Place 1 tablet (0.4 mg total) under the tongue every 5 (five) minutes as needed for chest pain.   nitroGLYCERIN 0.4 MG SL tablet Commonly known as: NITROSTAT Place under the  tongue.   rosuvastatin 40 MG tablet Commonly known as: CRESTOR Take 1 tablet (40 mg total) by mouth daily.   torsemide 20 MG tablet Commonly known as: DEMADEX Take 1 tablet (20 mg total) by mouth daily.   torsemide  20 MG tablet Commonly known as: DEMADEX Take 1 tablet (20 mg total) by mouth 2 (two) times a week. Mondays and Fridays        Allergies:  Allergies  Allergen Reactions   Hydrochlorothiazide Other (See Comments)    Gout , uncontrolled diabetes and renal insufficiency    Past Medical History:  Diagnosis Date   Cataract    Chronic heart failure with preserved ejection fraction (HFpEF) (HCC)    Colon polyp 2003   Dr [name]; F/U was to be 2008( not completed)   CVA (cerebral infarction) 2014   Diabetes mellitus    Diverticulosis 2003   Dyspnea    with exertion   Gout    Hypertension    Myocardial infarction ([place]) 2014   ?? PEA cardiac arrest   OSA (obstructive sleep apnea) 03/27/2018   Pneumonia 09/29/11   Avelox X 10 days as OP   Prostate cancer ([place])    Renal insufficiency    Seizures ([place])    not treated for seizure disorder; had a seizure after stroke 2014; no seizure since then    Past Surgical History:  Procedure Laterality Date   COLONOSCOPY W/ POLYPECTOMY  2003   no F/U ([place] discussed 12/03/12)   INSERTION OF MESH N/A 07/16/2017   Procedure: INSERTION OF MESH;  Surgeon: [name], MD;  Location: [facility];  Service: General;  Laterality: N/A;   PEG PLACEMENT  2014   PEG TUBE REMOVAL  2014   PROSTATE BIOPSY     RIGHT/LEFT HEART CATH AND CORONARY ANGIOGRAPHY N/A 09/20/2020   Procedure: RIGHT/LEFT HEART CATH AND CORONARY ANGIOGRAPHY;  Surgeon: Leonie Man, MD;  Location: [facility];  Service: Cardiovascular;  Laterality: N/A;   TEE WITHOUT CARDIOVERSION N/A 09/27/2020   Procedure: TRANSESOPHAGEAL ECHOCARDIOGRAM (TEE);  Surgeon: Wonda Olds, MD;  Location: [facility] OR;  Service: Thoracic;  Laterality: N/A;   TRACHEOSTOMY  2014    was closed after hospital stay   [place] N/A 07/16/2017   Procedure: [place];  Surgeon: [name], MD;  Location: [facility] OR;  Service: General;  Laterality: N/A;   wrist aspiration  02/16/12    monosodium urate crystals; Dr Caralyn Guile    Family History  Problem Relation Age of Onset   Hypertension Father    Prostate cancer Father    Diabetes Father    Cancer Father    Hypertension Mother    Diabetes Mother    Stroke Neg Hx    Heart disease Neg Hx    Colon cancer Neg Hx     Social History:  reports that he quit smoking about 52 years ago. His smoking use included cigarettes. He has a 0.25 pack-year smoking history. He has never used smokeless tobacco. He reports that he does not drink alcohol and does not use drugs.    Review of Systems   Lipids: Has normal lipids, not on a statin drug  Lab Results  Component Value Date   CHOL 88 11/23/2020   HDL 41.90 11/23/2020   LDLCALC 34 11/23/2020   TRIG 64.0 11/23/2020   CHOLHDL 2 11/23/2020    Eye exams: Last done in 1/20 without retinopathy     He has renal insufficiency of unclear etiology with variable creatinine levels as follows:   Lab Results  Component Value Date   CREATININE 2.07 (H) 02/07/2021   CREATININE 1.97 (H) 01/03/2021   CREATININE 2.08 (H) 11/23/2020  HYPERTENSION: on losartan 50 mg, amlodipine , clonidine 0.3 mg and hydralazine as well as metoprolol  Currently managed by PCP and cardiologist  Monitors blood pressure at home  He takes Lasix for edema   BP Readings from Last 3 Encounters:  02/08/21 (!) 142/78  12/03/20 (!) 142/82  11/24/20 122/70    Foot exam done in 9/22  Although he does not appear to have any sensory loss he has significant deformities and podiatrist has requested diabetic shoes    Physical Examination:  BP (!) 142/78   Pulse (!) 53   Ht 5\' 6"  (1.676 m)   Wt 165 lb (74.8 kg)   SpO2 98%   BMI 26.63 kg/m    Diabetic Foot Exam - Simple   Simple Foot Form Diabetic Foot exam was performed with the following findings: Yes   Visual Inspection See comments: Yes Sensation Testing Intact to touch and monofilament testing bilaterally: Yes Pulse Check Posterior Tibialis and Dorsalis pulse intact bilaterally: Yes Comments Hammertoes of second toe present bilaterally.  Also has medial deviation of great toes bilaterally.  Skin intact      ASSESSMENT/PLAN:  Diabetes type 2, uncontrolled   See history of present illness for discussion of current diabetes management, blood sugar patterns and problems identified  A1c is 6.8 and improved  He is on Jardiance 25 mg daily and Amaryl 1 mg  Although he is not quite sure whether he has taken Amaryl his blood sugars appear to be significantly better than before and very consistent  No increase in creatinine with adding higher dose of Jardiance, overall renal function appears to be better than earlier this summer  No hypoglycemia   Previously had lost weight and weight is improving now  He still has inconsistent regimen of exercise  He will continue the same regimen Continue to monitor blood sugar by rotation at different times  Renal dysfunction: Creatinine is relatively better, still has not been referred to nephrologist Will message PCP regarding nephrology input  Hypertension: Appears to be well controlled now, to continue follow-up with cardiology  High calcium level of 10.6: Resolved and PTH low normal considering his renal function  Influenza vaccine given  Patient Instructions  Take 1/2 Glimeperide daily   Elayne Snare 02/08/2021, 3:55 PM

## 2021-02-09 ENCOUNTER — Telehealth: Payer: Self-pay | Admitting: Podiatry

## 2021-02-09 ENCOUNTER — Encounter: Payer: Self-pay | Admitting: Internal Medicine

## 2021-02-09 ENCOUNTER — Other Ambulatory Visit: Payer: Self-pay | Admitting: Internal Medicine

## 2021-02-09 DIAGNOSIS — N1832 Chronic kidney disease, stage 3b: Secondary | ICD-10-CM

## 2021-02-09 NOTE — Telephone Encounter (Signed)
Pts wife left message stating they seen Dr Dwyane Dee and he was to be faxing the paperwork over to [place] for pt to get diabetic shoes.  I returned call and told pts wife I have not seen it as of now but I will keep an eye out for it.

## 2021-02-10 ENCOUNTER — Telehealth: Payer: Self-pay | Admitting: Pharmacist

## 2021-02-10 DIAGNOSIS — C61 Malignant neoplasm of prostate: Secondary | ICD-10-CM | POA: Diagnosis not present

## 2021-02-10 DIAGNOSIS — K4090 Unilateral inguinal hernia, without obstruction or gangrene, not specified as recurrent: Secondary | ICD-10-CM | POA: Diagnosis not present

## 2021-02-10 NOTE — Progress Notes (Signed)
Chronic Care Management Pharmacy Assistant   Name: Corey [name]  MRN: [id number] DOB: May 18, 1945  Reason for Encounter: Disease State - Diabetes   Recent office visits:  None noted  Recent consult visits:  02/08/21 Dwyane Dee (Endo) - F/U Uncontrolled type 2 diabetes mellitus with hyperglycemia, without long-term current use of insulin. Flu given.  01/25/21 [place] - Pain due to onychomycosis of toenails of both feet. No med changes.   12/03/20 [facility] (Endo) - Uncontrolled type 2 diabetes mellitus with hyperglycemia, without long-term current use of insulin. D/c Metformin 500mg .   Hospital visits:  None in previous 6 months  Medications: Outpatient Encounter Medications as of 02/10/2021  Medication Sig   ACCU-CHEK AVIVA PLUS test strip TEST BLOOD SUGAR TWICE DAILY AS DIRECTED   ACCU-CHEK AVIVA PLUS test strip Use as instructed   albuterol (VENTOLIN HFA) 108 (90 Base) MCG/ACT inhaler Inhale 2 puffs into the lungs every 4 (four) hours as needed for wheezing or shortness of breath.   allopurinol (ZYLOPRIM) 100 MG tablet Take 1/2 (one-half) tablet by mouth once daily   aspirin EC 81 MG tablet Take 1 tablet (81 mg total) by mouth daily.   bismuth subsalicylate (PEPTO BISMOL) 262 MG/15ML suspension Take 30 mLs by mouth every 6 (six) hours as needed for indigestion.   carvedilol (COREG) 6.25 MG tablet Take 1 tablet (6.25 mg total) by mouth 2 (two) times daily with a meal.   colchicine (COLCRYS) 0.6 MG tablet Take 2 tabs once and then one tab one hour later as needed for gout   empagliflozin (JARDIANCE) 25 MG TABS tablet Take 1 tablet (25 mg total) by mouth daily before breakfast.   glimepiride (AMARYL) 1 MG tablet    hydrALAZINE (APRESOLINE) 50 MG tablet Take 2 tablets (100 mg total) by mouth 3 (three) times daily.   isosorbide mononitrate (IMDUR) 60 MG 24 hr tablet Take 2 tablets (120 mg total) by mouth daily.   isosorbide mononitrate (IMDUR) 60 MG 24 hr tablet Take 60 mg by mouth in the  morning and at bedtime.   losartan (COZAAR) 100 MG tablet    nitroGLYCERIN (NITROSTAT) 0.4 MG SL tablet Place 1 tablet (0.4 mg total) under the tongue every 5 (five) minutes as needed for chest pain.   nitroGLYCERIN (NITROSTAT) 0.4 MG SL tablet Place under the tongue.   rosuvastatin (CRESTOR) 40 MG tablet Take 1 tablet (40 mg total) by mouth daily.   torsemide (DEMADEX) 20 MG tablet Take 1 tablet (20 mg total) by mouth daily.   torsemide (DEMADEX) 20 MG tablet Take 1 tablet (20 mg total) by mouth 2 (two) times a week. Mondays and Fridays   No facility-administered encounter medications on file as of 02/10/2021.    Recent Office Vitals: BP Readings from Last 3 Encounters:  02/08/21 (!) 142/78  12/03/20 (!) 142/82  11/24/20 122/70   Pulse Readings from Last 3 Encounters:  02/08/21 (!) 53  12/03/20 (!) 59  11/24/20 (!) 57    Wt Readings from Last 3 Encounters:  02/08/21 165 lb (74.8 kg)  12/03/20 159 lb 12.8 oz (72.5 kg)  11/24/20 158 lb (71.7 kg)     Kidney Function Lab Results  Component Value Date/Time   CREATININE 2.07 (H) 02/07/2021 09:23 AM   CREATININE 1.97 (H) 01/03/2021 08:41 AM   CREATININE 1.69 (H) 01/28/2020 10:00 AM   GFR 31.34 (L) 02/07/2021 09:23 AM   GFRNONAA 30 (L) 10/27/2020 10:47 AM   GFRNONAA 40 (L) 01/28/2020 10:00 AM  GFRAA 46 (L) 01/28/2020 10:00 AM    BMP Latest Ref Rng & Units 02/07/2021 01/03/2021 11/23/2020  Glucose 70 - 99 mg/dL 104(H) 125(H) 85  BUN 6 - 23 mg/dL 37(H) 44(H) 39(H)  Creatinine 0.40 - 1.50 mg/dL 2.07(H) 1.97(H) 2.08(H)  BUN/Creat Ratio 10 - 24 - - -  Sodium 135 - 145 mEq/L 142 140 142  Potassium 3.5 - 5.1 mEq/L 4.3 4.4 4.3  Chloride 96 - 112 mEq/L 111 107 106  CO2 19 - 32 mEq/L 24 26 29   Calcium 8.4 - 10.5 mg/dL 9.8 10.3 10.6(H)    Recent Relevant Labs: Lab Results  Component Value Date/Time   HGBA1C 6.8 (H) 02/07/2021 09:23 AM   HGBA1C 7.2 (H) 11/23/2020 10:56 AM   MICROALBUR 8.5 (H) 11/23/2020 10:56 AM   MICROALBUR 2.4  (H) 03/03/2020 11:09 AM    Kidney Function Lab Results  Component Value Date/Time   CREATININE 2.07 (H) 02/07/2021 09:23 AM   CREATININE 1.97 (H) 01/03/2021 08:41 AM   CREATININE 1.69 (H) 01/28/2020 10:00 AM   GFR 31.34 (L) 02/07/2021 09:23 AM   GFRNONAA 30 (L) 10/27/2020 10:47 AM   GFRNONAA 40 (L) 01/28/2020 10:00 AM   GFRAA 46 (L) 01/28/2020 10:00 AM    Current antihyperglycemic regimen:  Metformin 500 mg BID  Jardiance 10 mg daily  What recent interventions/DTPs have been made to improve glycemic control:  9/27 Dr. Dwyane Dee ordered to take 1/2 Glimeperide daily  Have there been any recent hospitalizations or ED visits since last visit with CPP? No  Patient denies hypoglycemic symptoms, including None  Patient reports hyperglycemic symptoms, including  sleeping  How often are you checking your blood sugar? once daily  What are your blood sugars ranging?  153 - 02/15/21  Patient just ate a Poptart.  During the week, how often does your blood glucose drop below 70? Never Are you checking your feet daily/regularly?  Patient states he just visited his Endocrinology and they put in an order for him some diabetic shoes.  Adherence Review: Is the patient currently on a STATIN medication? No Is the patient currently on ACE/ARB medication? Yes Does the patient have >5 day gap between last estimated fill dates? No   Star Rating Drugs:  Medication:  Last Fill: Day Supply Rosuvastatin   12/24/20  90  Losartan  12/01/20 90 Glimepiride  12/08/20 90 Jardiance  01/24/21 90  Care Gaps: Annual wellness visit in last year? No Most Recent BP reading: 142/82  If Diabetic: Most recent A1C reading: 6.4 Last eye exam / retinopathy screening: 05/2018 Last diabetic foot exam: 01/25/21  CCM appointment on 02/17/21 .  Orinda Kenner, [facility] Clinical Pharmacists Assistant [phone]

## 2021-02-11 DIAGNOSIS — E1165 Type 2 diabetes mellitus with hyperglycemia: Secondary | ICD-10-CM

## 2021-02-11 DIAGNOSIS — I5040 Unspecified combined systolic (congestive) and diastolic (congestive) heart failure: Secondary | ICD-10-CM

## 2021-02-15 ENCOUNTER — Telehealth: Payer: Self-pay

## 2021-02-15 NOTE — Telephone Encounter (Signed)
Called and spoke to patients wife (okay per DPR) in regards to e-mail from [place]- will print results and have them scanned into chart.   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   These patients tend to be equivocal on PYP, but genetically positive. We may also be fairly early in the disease process. Given amyloid deposits on biopsy and now a positive genetic test, we have our diagnosis. Good job team!  I will have my nurse get this scanned in his chart. We will also get him started on Tafamadis as hereditary cases tend to be more aggressive.   -Wes  Almyra Free -> Can we call Mr. Cygan and get him into see me to discuss? Also, we need to get the [facility] paperwork filled out.    I called and got patient into see Dr.O'Neal October 27th at 9:40 AM.  Patient wife verbalized understanding.

## 2021-02-17 ENCOUNTER — Ambulatory Visit (INDEPENDENT_AMBULATORY_CARE_PROVIDER_SITE_OTHER): Payer: Medicare HMO | Admitting: Pharmacist

## 2021-02-17 ENCOUNTER — Other Ambulatory Visit: Payer: Self-pay

## 2021-02-17 DIAGNOSIS — M1A9XX0 Chronic gout, unspecified, without tophus (tophi): Secondary | ICD-10-CM

## 2021-02-17 DIAGNOSIS — I5040 Unspecified combined systolic (congestive) and diastolic (congestive) heart failure: Secondary | ICD-10-CM

## 2021-02-17 DIAGNOSIS — I25119 Atherosclerotic heart disease of native coronary artery with unspecified angina pectoris: Secondary | ICD-10-CM

## 2021-02-17 DIAGNOSIS — I10 Essential (primary) hypertension: Secondary | ICD-10-CM

## 2021-02-17 DIAGNOSIS — N1832 Chronic kidney disease, stage 3b: Secondary | ICD-10-CM

## 2021-02-17 DIAGNOSIS — Z794 Long term (current) use of insulin: Secondary | ICD-10-CM

## 2021-02-17 DIAGNOSIS — E1122 Type 2 diabetes mellitus with diabetic chronic kidney disease: Secondary | ICD-10-CM

## 2021-02-17 NOTE — Progress Notes (Signed)
Chronic Care Management Pharmacy Note  02/17/2021 Name:  Corey [name] MRN:  [id number] DOB:  1945-11-15  Summary: -Pt had mixed Jardiance 10 and 25 mg tablets in one bottle by mistake and was taking 2 per day  -Pt reports weight is 167 lbs (dry weight ~158 lbs) and BP is higher than usual, endorses excess swelling; he reports his cardiologist has told him previously to take extra torsemide if he gains weight like this -Pt thinks he has a gout flare in his hand/pinky - he has already taken 2 colchicine tablets and noticed an improvement in swelling/pain  Recommendations/Changes made from today's visit: -Pt will remove Jardiance 10 mg tablets (round) and just take 25 mg tablet (oblong) 1 per day -Advised to increase torsemide to 3x per week until cardiology f/u 10/27 -Advised not to take any more colchicine this week due to kidney impairment; call PCP if no improvement   Subjective: [name] is an 75 y.o. year old male who is a primary patient of Burns, [name], MD.  The CCM team was consulted for assistance with disease management and care coordination needs.    Engaged with patient by telephone for follow up visit in response to provider referral for pharmacy case management and/or care coordination services.   Consent to Services:  The patient was given information about Chronic Care Management services, agreed to services, and gave verbal consent prior to initiation of services.  Please see initial visit note for detailed documentation.   Patient Care Team: [name], MD as PCP - General (Internal Medicine) Jerline Pain, MD as PCP - Cardiology (Cardiology) Charlton Haws, [facility] as Pharmacist (Pharmacist) Knox Royalty, RN as Case Manager  Recent office visits: 02/09/21 pt message - PCP referred to nephrologist.  10/15/20 [name] OV: hospital f/u; referred to home health  Recent consult visits: 02/08/21 Dr Dwyane Dee (endocrine): continue Jardiance 25 mg, glimepiride  1/2 tab  01/25/21 Dr Elisha Ponder (Podiatry): f/u onychomycosis  11/24/20 Dr Marlou Porch OV: f/u CAD, CHF. no med changes  10/27/20 Dr Shan Levans ([facility] clinic): hospital f/u 10/18/20 LCSW - applied for LIS after Jardiance PAP denial.  10/14/20 Dr Orvan Seen (CT surgery): f/u pericardial window (pericardial effusion)  10/08/20 Dr Shan Levans ([facility] clinic): lost addnl 10 lbs on torsemide 20 mg daily; given scale to weigh at home; reduce torsemide to twice weekly (Mon-Fri); continue carvedilol, avoid escalating further d/t hx bradycardia and amyloidosis; provided w/ Jardiance samples and started [facility] Cares application - faxed to Dr Marlou Porch. Faxed to [facility] visits: Medication Reconciliation was completed by comparing discharge summary, patient's EMR and Pharmacy list, and upon discussion with patient.   Admitted to the hospital on 09/20/20 due to Progressive angina/CAD and acute CHF. Discharge date was 09/30/20. Discharged from [facility].   -Symptoms improved with diuresis. Trial of ARNI aborted d/t AKI.    New?Medications Started at [facility] Discharge:?? -Carvedilol 6.25 mg BID -Jardiance 10 mg -Rosuvastatin -Nitroglycerin  Medication Changes at Hospital Discharge: -furosemide to torsemide (due to cardiac amyloidosis)   Medications Discontinued at Hospital Discharge: amlodipine Clonidine Furosemide Glimepiride Losartan Naproxen   Medications that remain the same after Hospital Discharge:?? -All other medications will remain the same.    Objective:  Lab Results  Component Value Date   CREATININE 2.07 (H) 02/07/2021   BUN 37 (H) 02/07/2021   GFR 31.34 (L) 02/07/2021   GFRNONAA 30 (L) 10/27/2020   GFRAA 46 (L) 01/28/2020   NA  142 02/07/2021   K 4.3 02/07/2021   CALCIUM 9.8 02/07/2021   CO2 24 02/07/2021   GLUCOSE 104 (H) 02/07/2021    Lab Results  Component Value Date/Time   HGBA1C 6.8 (H) 02/07/2021 09:23 AM   HGBA1C 7.2 (H) 11/23/2020 10:56 AM   FRUCTOSAMINE 338  (H) 01/03/2021 08:41 AM   FRUCTOSAMINE 287 (H) 03/01/2020 09:09 AM   GFR 31.34 (L) 02/07/2021 09:23 AM   GFR 33.28 (L) 01/03/2021 08:41 AM   MICROALBUR 8.5 (H) 11/23/2020 10:56 AM   MICROALBUR 2.4 (H) 03/03/2020 11:09 AM    Last diabetic Eye exam:  Lab Results  Component Value Date/Time   HMDIABEYEEXA No Retinopathy 05/27/2018 12:00 AM    Last diabetic Foot exam: No results found for: HMDIABFOOTEX   Lab Results  Component Value Date   CHOL 88 11/23/2020   HDL 41.90 11/23/2020   LDLCALC 34 11/23/2020   TRIG 64.0 11/23/2020   CHOLHDL 2 11/23/2020    Hepatic Function Latest Ref Rng & Units 02/07/2021 11/23/2020 09/23/2020  Total Protein 6.0 - 8.3 g/dL 6.5 7.1 6.2(L)  Albumin 3.5 - 5.2 g/dL 3.7 4.1 3.3(L)  AST 0 - 37 U/L 45(H) 35 25  ALT 0 - 53 U/L 51 20 16  Alk Phosphatase 39 - 117 U/L 90 64 67  Total Bilirubin 0.2 - 1.2 mg/dL 0.7 0.6 1.3(H)  Bilirubin, Direct 0.0 - 0.3 mg/dL - - -    Lab Results  Component Value Date/Time   TSH 1.520 09/27/2020 08:46 AM   TSH 0.243 (L) 06/06/2019 03:43 AM   TSH 1.13 01/23/2017 11:48 AM   TSH 1.06 12/03/2012 11:43 AM   FREET4 1.02 09/27/2020 08:46 AM   FREET4 0.82 06/06/2019 03:43 AM    CBC Latest Ref Rng & Units 09/29/2020 09/28/2020 09/27/2020  WBC 4.0 - 10.5 K/uL 8.2 9.8 5.1  Hemoglobin 13.0 - 17.0 g/dL 13.6 13.4 14.5  Hematocrit 39.0 - 52.0 % 44.0 42.4 45.2  Platelets 150 - 400 K/uL 218 223 196    No results found for: VD25OH  Clinical ASCVD: Yes  The ASCVD Risk score (Arnett DK, et al., 2019) failed to calculate for the following reasons:   The patient has a prior MI or stroke diagnosis    Depression screen [facility] 2/9 11/04/2020 07/30/2020 06/12/2019  Decreased Interest 0 0 0  Down, Depressed, Hopeless 0 0 0  PHQ - 2 Score 0 0 0  Some recent data might be hidden    Social History   Tobacco Use  Smoking Status Former   Packs/day: 0.25   Years: 1.00   Pack years: 0.25   Types: Cigarettes   Quit date: 05/15/1968   Years since  quitting: 52.7  Smokeless Tobacco Never  Tobacco Comments   smoked [place], up to 1-2 cigarettes/ day   BP Readings from Last 3 Encounters:  02/08/21 (!) 142/78  12/03/20 (!) 142/82  11/24/20 122/70   Pulse Readings from Last 3 Encounters:  02/08/21 (!) 53  12/03/20 (!) 59  11/24/20 (!) 57   Wt Readings from Last 3 Encounters:  02/08/21 165 lb (74.8 kg)  12/03/20 159 lb 12.8 oz (72.5 kg)  11/24/20 158 lb (71.7 kg)   BMI Readings from Last 3 Encounters:  02/08/21 26.63 kg/m  12/03/20 25.79 kg/m  11/24/20 25.50 kg/m    Assessment/Interventions: Review of patient past medical history, allergies, medications, health status, including review of consultants reports, laboratory and other test data, was performed as part of comprehensive evaluation and  provision of chronic care management services.   SDOH:  (Social Determinants of Health) assessments and interventions performed: Yes  SDOH Screenings   Alcohol Screen: Not on file  Depression (PHQ2-9): Low Risk    PHQ-2 Score: 0  Financial Resource Strain: Medium Risk   Difficulty of Paying Living Expenses: Somewhat hard  Food Insecurity: No Food Insecurity   Worried About Charity fundraiser in the Last Year: Never true   Ran Out of Food in the Last Year: Never true  Housing: Low Risk    Last Housing Risk Score: 0  Physical Activity: Not on file  Social Connections: Not on file  Stress: Not on file  Tobacco Use: Medium Risk   Smoking Tobacco Use: Former   Smokeless Tobacco Use: Never  Transportation Needs: No Transportation Needs   Lack of Transportation (Medical): No   Lack of Transportation (Non-Medical): No    CCM Care Plan  Allergies  Allergen Reactions   Hydrochlorothiazide Other (See Comments)    Gout , uncontrolled diabetes and renal insufficiency    Medications Reviewed Today     Reviewed by Charlton Haws, [facility] (Pharmacist) on 02/17/21 at 1423  Med List Status: <None>   Medication Order  Taking? Sig Documenting Provider Last Dose Status Informant  ACCU-CHEK AVIVA PLUS test strip [id number] Yes TEST BLOOD SUGAR TWICE DAILY AS DIRECTED Elayne Snare, MD Taking Active   ACCU-CHEK AVIVA PLUS test strip [id number] Yes Use as instructed Elayne Snare, MD Taking Active   albuterol (VENTOLIN HFA) 108 (90 Base) MCG/ACT inhaler [id number] Yes Inhale 2 puffs into the lungs every 4 (four) hours as needed for wheezing or shortness of breath. Janith Lima, MD Taking Active Self  allopurinol (ZYLOPRIM) 100 MG tablet [id number] Yes Take 1/2 (one-half) tablet by mouth once daily [name], MD Taking Active   aspirin EC 81 MG tablet [id number] Yes Take 1 tablet (81 mg total) by mouth daily. [name], MD Taking Active   bismuth subsalicylate (PEPTO BISMOL) 262 MG/15ML suspension [id number] Yes Take 30 mLs by mouth every 6 (six) hours as needed for indigestion. [provider] Taking Active Self  carvedilol (COREG) 6.25 MG tablet [id number] Yes Take 1 tablet (6.25 mg total) by mouth 2 (two) times daily with a meal. Burns, [name], MD Taking Active   colchicine (COLCRYS) 0.6 MG tablet [id number] Yes Take 2 tabs once and then one tab one hour later as needed for gout [name], MD Taking Active   empagliflozin (JARDIANCE) 25 MG TABS tablet [id number] Yes Take 1 tablet (25 mg total) by mouth daily before breakfast. Jerline Pain, MD Taking Active            Med Note Rolan Bucco Feb 17, 2021  2:23 PM) Via [facility] Cares pt assistance  glimepiride (AMARYL) 1 MG tablet [id number] Yes  [provider] Taking Active   hydrALAZINE (APRESOLINE) 50 MG tablet [id number] Yes Take 2 tablets (100 mg total) by mouth 3 (three) times daily. Kroeger, Lorelee Cover., PA-C Taking Active   isosorbide mononitrate (IMDUR) 60 MG 24 hr tablet [id number] Yes Take 2 tablets (120 mg total) by mouth daily. Kroeger, Lorelee Cover., PA-C Taking Active   isosorbide mononitrate (IMDUR) 60 MG 24 hr tablet [id number]  Yes Take 60 mg by mouth in the morning and at bedtime. [provider] Taking Active   losartan (COZAAR) 100 MG tablet [id number] Yes  [provider] Taking Active   nitroGLYCERIN (  NITROSTAT) 0.4 MG SL tablet [id number] Yes Place 1 tablet (0.4 mg total) under the tongue every 5 (five) minutes as needed for chest pain. Kroeger, Lorelee Cover., PA-C Taking Active   nitroGLYCERIN (NITROSTAT) 0.4 MG SL tablet [id number] Yes Place under the tongue. [provider] Taking Active   rosuvastatin (CRESTOR) 40 MG tablet [id number] Yes Take 1 tablet (40 mg total) by mouth daily. Kroeger, Lorelee Cover., PA-C Taking Active   torsemide (DEMADEX) 20 MG tablet [id number] Yes Take 1 tablet (20 mg total) by mouth 2 (two) times a week. 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) 09/20/2020   Soft tissue mass 07/30/2020   Contracture of joint of finger of left hand 07/30/2020   Acute respiratory disease due to COVID-19 virus 06/05/2019   DOE (dyspnea on exertion) 03/01/2019   Mild cognitive impairment 12/05/2018   Slurred speech 07/23/2018   Muscle twitching 07/23/2018   OSA (obstructive sleep apnea) 03/27/2018   Snores 01/21/2018   Poor balance 01/23/2017   Ankle swelling, left 09/06/2016   Bradycardia 01/28/2016   Weak urinary stream 12/31/2015    Stage 3b chronic kidney disease 09/15/2015   Ventral hernia without obstruction or gangrene 09/15/2015   Prostate cancer ([place]) 07/25/2015   Erectile dysfunction 05/04/2015   Urinary frequency 05/04/2015   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   Abnormal finding on MRI of brain 02/14/2015   PEA (Pulseless electrical activity) ([place]) 12/03/2012   Diabetes ([place]) 06/30/2011   Gout 03/05/2007   Essential hypertension 03/05/2007    Immunization History  Administered Date(s) Administered   Fluad Quad(high Dose 65+) 03/05/2019, 01/28/2020, 02/08/2021   Influenza Split 03/02/2011   Influenza Whole 01/23/2013   Influenza, High Dose Seasonal PF 01/28/2016, 01/23/2017, 01/21/2018   Influenza,inj,Quad PF,6+ Mos 02/23/2014, 02/24/2015   PFIZER(Purple Top)SARS-COV-2 Vaccination 07/08/2019, 07/29/2019, 02/07/2020   Pneumococcal Conjugate-13 05/04/2015   Pneumococcal Polysaccharide-23 10/02/2012, 01/21/2018   Tdap 12/15/2010, 09/21/2011    Conditions to be addressed/monitored:  Hypertension, Hyperlipidemia, Diabetes, Heart Failure, Coronary Artery Disease, Chronic Kidney Disease, and Gout  Care Plan : [place]  Updates made by Charlton Haws, [place] since 02/17/2021 12:00 AM     Problem: Hypertension, Hyperlipidemia, Diabetes, Heart Failure, Coronary Artery Disease, Chronic Kidney Disease, and Gout   Priority: High     Long-Range Goal: Disease management   Start Date: 11/16/2020  Expected End Date: 02/17/2022  This Visit's Progress: Not on track  Recent Progress: On track  Priority: High  Note:   Current Barriers:  Unable to independently monitor therapeutic efficacy  Pharmacist Clinical Goal(s):  Patient will achieve adherence to monitoring guidelines and medication adherence to achieve therapeutic efficacy through collaboration with PharmD and provider.   Interventions: 1:1 collaboration with [name], MD regarding development and update of  comprehensive plan of care as evidenced by provider attestation and  co-signature Inter-disciplinary care team collaboration (see longitudinal plan of care) Comprehensive medication review performed; medication list updated in electronic medical record   Hypertension / Heart Failure    BP goal is:  <130/80 Patient checks BP at home daily Patient home BP readings are ranging: 140s/70s Weight: 167 lbs yesterday (dry weight ~158 lbs)   Patient has failed these meds in the past: n/a Patient is currently controlled on the following medications: Hydralazine 50 mg - 2 tab TID Carvedilol 6.25 mg BID Torsemide 20 mg twice a week (Mon, Fri) Isosorbide MN 60 mg BID Jardiance 25 mg daily   We discussed: Pt reports weight has been "up" lately, 167 lbs yesterday; his dry weight is ~158 lbs; he reports his cardiologist had told him to take an extra dose of torsemide if he starts gaining weight -GFR is low ~31; pt has been referred to nephrology but has not scheduled appt yet; need to be careful with torsemide dosing -Discussed importance of limiting salt in diet  Plan: Advised to take an extra dose of torsemide per week until follow up with cardiologist (10/27)   Hyperlipidemia / CAD    LDL goal < 70 Hx stroke 2011; CAD 09/2020   Patient has failed these meds in past: n/a Patient is currently controlled on the following medications: Aspirin 81 mg daily Rosuvastatin 40 mg daily Nitroglycerin 0.4 mg SL prn   We discussed:  pt reports compliance with rosuvastatin, started in hospital after anginal episode 09/2020. LDL is at goal   Plan: Recommend to continue current medication   Diabetes    A1c goal <7% Checking BG: 2x per Day Recent FBG Readings: 154, 111, 104 Recent HS BG readings: 104, 78   Patient has failed these meds in past: glimepiride, pioglitazone Patient is currently controlled on the following medications: Jardiance 25 mg daily Glimepiride 1 mg - 1/2 tab daily  We  discussed: Pt reports he is taking 2 Jardiance tablets per day; we discussed his prescribed dose is 25 mg daily; upon further questioning it was discovered that patient received a shipment of Jardiance 25 mg ~mid September and had combined a bottle of 25 mg and 10 mg into 1 bottle and has been taking 2 pills daily; pt can tell that pills are different shapes - 10 mg is round and 25 mg is oblong; advised pt to remove 10 mg pills and just take 1 25 mg tablet daily    Plan: Recommend to continue current medication (remove Jardiance 10 mg tablets from pill box)   Gout    Acute gout flare? Patient has failed these meds in past: n/a Patient is currently uncontrolled on the following medications: Colchicine 0.6 mg PRN Allopurinol 50 mg daily (100 mg 1/2 tab)   We discussed:  Pt reports his hand started swelling a few days ago; he reports his pinky hurts the worst; he denies stings, bites or other damage to the hand; he thinks it may be a gout flare; he has taken colchicine tablets today; advised him he can take 1 more than he should not take any more for at least a week due to his kidney function; advised pt to call PCP if no improvement   Plan: Avoid further colchicine doses this week due to kidney impairment; contact PCP if no improvement  Patient Goals/Self-Care Activities Patient will:  - take medications as prescribed -focus on medication adherence by pill box -check glucose dail -check blood pressure daily -weigh daily, take an extra torsemide dose  if weight increase 2+ lbs overnight or 5+ lbs in a week -engage in dietary modifications by reducing salt -Take Torsemide 3 times a week until cardiology appt 10/27 -Avoid any more colchicine doses this week; call PCP if no improvement      Medication Assistance:  Jardiance - BI cares approved through 05/14/21 (10 mg shipped early Aug, 25 mg shipped ~mid Sept)  Compliance/Adherence/Medication fill history: Care Gaps: Shingrix Eye exam  (due 05/28/19) Covid booster (due 05/08/20)  Star-Rating Drugs: Metformin - LF 08/24/20 x 90 ds Rosuvastatin - LF 09/30/20 x 90 ds Jardiance - LF 09/30/20  Patient's preferred pharmacy is:  [facility] ([place] [place] [address] [place] ([place]) [place] [address] Phone: [phone] Fax: [phone]  [place] [place] [address] Phone: [phone] Fax: [phone]  Uses pill box? Yes Pt endorses 100% compliance  We discussed: Current pharmacy is preferred with insurance plan and patient is satisfied with pharmacy services Patient decided to: Continue current medication management strategy  Care Plan and Follow Up Patient Decision:  Patient agrees to Care Plan and Follow-up.  Plan: Telephone follow up appointment with care management team member scheduled for:  2 months  Charlene Brooke, PharmD, [place], CPP Clinical Pharmacist [facility] at [facility] [phone]

## 2021-02-17 NOTE — Patient Instructions (Signed)
Visit Information  Phone number for Pharmacist: [phone]   Goals Addressed             This Visit's Progress    Manage My Medicine       Timeframe:  Long-Range Goal Priority:  High Start Date:      11/16/20                       Expected End Date: 02/17/2022                     Follow Up Date Dec 2022   - call for medicine refill 2 or 3 days before it runs out - call if I am sick and can't take my medicine - keep a list of all the medicines I take; vitamins and herbals too - use a pillbox to sort medicine -check blood pressure daily -weigh daily, take an extra torsemide dose if weight increase 2+ lbs overnight or 5+ lbs in a week -engage in dietary modifications by reducing salt -Take Torsemide 3 times a week until cardiology appt 10/27 -Avoid any more colchicine doses this week; call PCP if no improvement   Why is this important?   These steps will help you keep on track with your medicines.   Notes:         Care Plan : [place]  Updates made by Charlton Haws, RPH since 02/17/2021 12:00 AM     Problem: Hypertension, Hyperlipidemia, Diabetes, Heart Failure, Coronary Artery Disease, Chronic Kidney Disease, and Gout   Priority: High     Long-Range Goal: Disease management   Start Date: 11/16/2020  Expected End Date: 02/17/2022  This Visit's Progress: Not on track  Recent Progress: On track  Priority: High  Note:   Current Barriers:  Unable to independently monitor therapeutic efficacy  Pharmacist Clinical Goal(s):  Patient will achieve adherence to monitoring guidelines and medication adherence to achieve therapeutic efficacy through collaboration with PharmD and provider.   Interventions: 1:1 collaboration with [name], MD regarding development and update of comprehensive plan of care as evidenced by provider attestation and co-signature Inter-disciplinary care team collaboration (see longitudinal plan of care) Comprehensive  medication review performed; medication list updated in electronic medical record   Hypertension / Heart Failure    BP goal is:  <130/80 Patient checks BP at home daily Patient home BP readings are ranging: 140s/70s Weight: 167 lbs yesterday (dry weight ~158 lbs)   Patient has failed these meds in the past: n/a Patient is currently controlled on the following medications: Hydralazine 50 mg - 2 tab TID Carvedilol 6.25 mg BID Torsemide 20 mg twice a week (Mon, Fri) Isosorbide MN 60 mg BID Jardiance 25 mg daily   We discussed: Pt reports weight has been "up" lately, 167 lbs yesterday; his dry weight is ~158 lbs; he reports his cardiologist had told him to take an extra dose of torsemide if he starts gaining weight -GFR is low ~31; pt has been referred to nephrology but has not scheduled appt yet; need to be careful with torsemide dosing -Discussed importance of limiting salt in diet  Plan: Advised to take an extra dose of torsemide per week until follow up with cardiologist (10/27)   Hyperlipidemia / CAD    LDL goal < 70 Hx stroke 2011; CAD 09/2020   Patient has failed these meds in past: n/a Patient is currently controlled on the following medications: Aspirin 81 mg  daily Rosuvastatin 40 mg daily Nitroglycerin 0.4 mg SL prn   We discussed:  pt reports compliance with rosuvastatin, started in hospital after anginal episode 09/2020. LDL is at goal   Plan: Recommend to continue current medication   Diabetes    A1c goal <7% Checking BG: 2x per Day Recent FBG Readings: 154, 111, 104 Recent HS BG readings: 104, 78   Patient has failed these meds in past: glimepiride, pioglitazone Patient is currently controlled on the following medications: Jardiance 25 mg daily Glimepiride 1 mg - 1/2 tab daily  We discussed: Pt reports he is taking 2 Jardiance tablets per day; we discussed his prescribed dose is 25 mg daily; upon further questioning it was discovered that patient received a  shipment of Jardiance 25 mg ~mid September and had combined a bottle of 25 mg and 10 mg into 1 bottle and has been taking 2 pills daily; pt can tell that pills are different shapes - 10 mg is round and 25 mg is oblong; advised pt to remove 10 mg pills and just take 1 25 mg tablet daily    Plan: Recommend to continue current medication (remove Jardiance 10 mg tablets from pill box)   Gout    Acute gout flare? Patient has failed these meds in past: n/a Patient is currently uncontrolled on the following medications: Colchicine 0.6 mg PRN Allopurinol 50 mg daily (100 mg 1/2 tab)   We discussed:  Pt reports his hand started swelling a few days ago; he reports his pinky hurts the worst; he denies stings, bites or other damage to the hand; he thinks it may be a gout flare; he has taken colchicine tablets today; advised him he can take 1 more than he should not take any more for at least a week due to his kidney function; advised pt to call PCP if no improvement   Plan: Avoid further colchicine doses this week due to kidney impairment; contact PCP if no improvement  Patient Goals/Self-Care Activities Patient will:  - take medications as prescribed -focus on medication adherence by pill box -check glucose dail -check blood pressure daily -weigh daily, take an extra torsemide dose if weight increase 2+ lbs overnight or 5+ lbs in a week -engage in dietary modifications by reducing salt -Take Torsemide 3 times a week until cardiology appt 10/27 -Avoid any more colchicine doses this week; call PCP if no improvement      Patient verbalizes understanding of instructions provided today and agrees to view in [place].  Telephone follow up appointment with pharmacy team member scheduled for: 2 months  Charlene Brooke, PharmD, [place], CPP Clinical Pharmacist [place] [facility] at [facility] [phone]

## 2021-03-08 NOTE — Progress Notes (Signed)
Cardiology Office Note:   Date:  03/10/2021  NAME:  Corey [name]    MRN: [id number] DOB:  April 22, 1946   PCP:  [name], MD  Cardiologist:  Candee Furbish, MD  Electrophysiologist:  None   Referring MD: [name], MD   Chief Complaint  Patient presents with   Congestive Heart Failure    History of Present Illness:   Corey [name] is a 75 y.o. male with a hx of systolic HF, hereditary cardiac amyloidosis (Val 142 Ile mutation), CAD, CKD stage III, pericardial effusion status post pericardial window who presents for follow-up.  He was admitted to the hospital in May 2022 with decompensated heart failure.  Underwent left and right heart catheterization which demonstrated elevated filling pressures.  Found to have severe mid LAD disease that was treated medically.  Also found to have large pericardial effusion during that hospitalization.  He underwent pericardial window.  He also had biopsy of the myocardium consistent with cardiac amyloidosis.  Subsequent PYP equivocal.  He is genetically positive for hereditary ATTR amyloidosis.  He is doing well.  He presents with his wife.  Volume status is acceptable.  Weights are down.  BP slightly elevated.  He does consume high salty meals.  Working with his wife on this.  Denies any significant chest pain or trouble breathing.  Overall seems to be stable.  We discussed his genetic testing in office today.  He is positive for hereditary cardiac amyloidosis.  He apparently has several brothers who have congestive heart failure.  I have informed them that they need to be tested as well as checked out for this condition.  He also has 1 son who is 62 years of age.  We briefly discussed on the phone and the meanings of his test.  We also discussed cardiac amyloidosis.  I recommended he discuss either with myself or a cardiologist who is well versed in genetic testing whether he would like to proceed with this.  This would have implications given his young  age.  Everything else appears to be stable.  We discussed starting tafamidis in office.  We will need to obtain assistance from the company.  He is okay to start this medication.  Heart rate noted to be in the 50s.  Denies any symptoms of dizziness or lightheadedness.  Tolerating beta-blocker well.  Problem List Systolic HF/Cardiac amyloidosis -EF 40% -hereditary TTR amyloid -positive myocardial biopsy 09/27/2020 -Val142Ile mutation  -negative serum/urine immunofixation, negative light chains  2. Pericardial Effusion s/p pericardial window 09/27/2020 3. CAD -80% mLAD 09/20/2020 4. CKD III 5. Bradycardia/1AVB 6. HTN  Past Medical History: Past Medical History:  Diagnosis Date   Cataract    CHF (congestive heart failure) ([place])    Chronic heart failure with preserved ejection fraction (HFpEF) ([place])    Colon polyp 2003   Dr [name]; F/U was to be 2008( not completed)   CVA (cerebral infarction) 2014   Diabetes mellitus    Diverticulosis 2003   Dyspnea    with exertion   Gout    Hypertension    Myocardial infarction ([place]) 2014   ?? PEA cardiac arrest   OSA (obstructive sleep apnea) 03/27/2018   Pneumonia 09/29/2011   Avelox X 10 days as OP   Prostate cancer ([place])    Renal insufficiency    Seizures ([place])    not treated for seizure disorder; had a seizure after stroke 2014; no seizure since then    Past Surgical History:  Past Surgical History:  Procedure Laterality Date   COLONOSCOPY W/ POLYPECTOMY  2003   no F/U ([place] discussed 12/03/12)   INSERTION OF MESH N/A 07/16/2017   Procedure: INSERTION OF MESH;  Surgeon: [name], MD;  Location: [facility];  Service: General;  Laterality: N/A;   PEG PLACEMENT  2014   PEG TUBE REMOVAL  2014   PROSTATE BIOPSY     RIGHT/LEFT HEART CATH AND CORONARY ANGIOGRAPHY N/A 09/20/2020   Procedure: RIGHT/LEFT HEART CATH AND CORONARY ANGIOGRAPHY;  Surgeon: Leonie Man, MD;  Location: [facility];  Service: Cardiovascular;   Laterality: N/A;   TEE WITHOUT CARDIOVERSION N/A 09/27/2020   Procedure: TRANSESOPHAGEAL ECHOCARDIOGRAM (TEE);  Surgeon: Wonda Olds, MD;  Location: [facility] OR;  Service: Thoracic;  Laterality: N/A;   TRACHEOSTOMY  2014   was closed after hospital stay   [place] N/A 07/16/2017   Procedure: [place];  Surgeon: [name], MD;  Location: [facility];  Service: General;  Laterality: N/A;   wrist aspiration  02/16/12    monosodium urate crystals; Dr Caralyn Guile    Current Medications: Current Meds  Medication Sig   ACCU-CHEK AVIVA PLUS test strip TEST BLOOD SUGAR TWICE DAILY AS DIRECTED   ACCU-CHEK AVIVA PLUS test strip Use as instructed   albuterol (VENTOLIN HFA) 108 (90 Base) MCG/ACT inhaler Inhale 2 puffs into the lungs every 4 (four) hours as needed for wheezing or shortness of breath.   allopurinol (ZYLOPRIM) 100 MG tablet Take 1/2 (one-half) tablet by mouth once daily   aspirin EC 81 MG tablet Take 1 tablet (81 mg total) by mouth daily.   bismuth subsalicylate (PEPTO BISMOL) 262 MG/15ML suspension Take 30 mLs by mouth every 6 (six) hours as needed for indigestion.   carvedilol (COREG) 6.25 MG tablet Take 1 tablet (6.25 mg total) by mouth 2 (two) times daily with a meal.   colchicine (COLCRYS) 0.6 MG tablet Take 2 tabs once and then one tab one hour later as needed for gout   empagliflozin (JARDIANCE) 25 MG TABS tablet Take 1 tablet (25 mg total) by mouth daily before breakfast.   glimepiride (AMARYL) 1 MG tablet    hydrALAZINE (APRESOLINE) 50 MG tablet Take 2 tablets (100 mg total) by mouth 3 (three) times daily.   isosorbide mononitrate (IMDUR) 60 MG 24 hr tablet Take 2 tablets (120 mg total) by mouth daily.   isosorbide mononitrate (IMDUR) 60 MG 24 hr tablet Take 60 mg by mouth in the morning and at bedtime.   losartan (COZAAR) 100 MG tablet    nitroGLYCERIN (NITROSTAT) 0.4 MG SL tablet Place 1 tablet (0.4 mg total) under the tongue every 5  (five) minutes as needed for chest pain.   nitroGLYCERIN (NITROSTAT) 0.4 MG SL tablet Place under the tongue.   rosuvastatin (CRESTOR) 40 MG tablet Take 1 tablet (40 mg total) by mouth daily.   torsemide (DEMADEX) 20 MG tablet Take 1 tablet (20 mg total) by mouth 2 (two) times a week. Mondays and Fridays     Allergies:    Hydrochlorothiazide   Social History: Social History   Socioeconomic History   Marital status: Married    Spouse name: Corey [name]   Number of children: 2   Years of education: 12   Highest education level: 12th grade  Occupational History   Not on file  Tobacco Use   Smoking status: Former    Packs/day: 0.25    Years: 1.00  Pack years: 0.25    Types: Cigarettes    Quit date: 05/15/1968    Years since quitting: 52.8   Smokeless tobacco: Never   Tobacco comments:    smoked [place], up to 1-2 cigarettes/ day  Vaping Use   Vaping Use: Never used  Substance and Sexual Activity   Alcohol use: No    Alcohol/week: 0.0 standard drinks   Drug use: No   Sexual activity: Yes  Other Topics Concern   Not on file  Social History Narrative   Walks three times a week, 2-3 miles, occasional walks on treadmill       Patient is right-handed. He lives with his wife in a one level home. He drinks 1 diet Mt. Dew a day and rarely has coffee. He walks 3 x a week for exercise.   Social Determinants of Health   Financial Resource Strain: Medium Risk   Difficulty of Paying Living Expenses: Somewhat hard  Food Insecurity: No Food Insecurity   Worried About Charity fundraiser in the Last Year: Never true   Ran Out of Food in the Last Year: Never true  Transportation Needs: No Transportation Needs   Lack of Transportation (Medical): No   Lack of Transportation (Non-Medical): No  Physical Activity: Not on file  Stress: Not on file  Social Connections: Not on file     Family History: The patient's family history includes Cancer in his father; Diabetes in his father  and mother; Heart failure in his brother and mother; Hypertension in his father and mother; Prostate cancer in his father. There is no history of Stroke, Heart disease, or Colon cancer.  ROS:   All other ROS reviewed and negative. Pertinent positives noted in the HPI.     EKGs/Labs/Other Studies Reviewed:   The following studies were personally reviewed by me today:  EKG:  EKG is  ordered today.  The ekg ordered today demonstrates sinus bradycardia heart rate 50, first-degree AV block 560 ms, and was personally reviewed by me.   LHC/RHC 09/20/2020 Hemodynamic findings consistent with moderate pulmonary hypertension. LV end diastolic pressure is severely elevated. There is no aortic valve stenosis. Mid LAD to Dist LAD lesion is 80% stenosed. Ramus lesion is 20% stenosed.  TTE 09/30/2020  1. Trace pericardial fluid posterior and around right AV groove. Compared  to prior study 09/21/20, pericardial effusion has essentially resolved.   2. Left ventricular ejection fraction, by estimation, is 40%. The left  ventricle has moderately decreased function. The left ventricle  demonstrates global hypokinesis. There is severe left ventricular  hypertrophy.   3. Right ventricular systolic function is moderately reduced. The right  ventricular size is normal. Severely increased right ventricular wall  thickness.   4. Trivial mitral valve regurgitation.   5. The inferior vena cava is dilated in size with <50% respiratory  variability, suggesting right atrial pressure of 15 mmHg.  Recent Labs: 05/06/2020: B Natriuretic Peptide 1,524.0 09/27/2020: TSH 1.520 09/29/2020: Hemoglobin 13.6; Platelets 218 02/07/2021: ALT 51; BUN 37; Creatinine, Ser 2.07; Potassium 4.3; Sodium 142   Recent Lipid Panel    Component Value Date/Time   CHOL 88 11/23/2020 1056   TRIG 64.0 11/23/2020 1056   HDL 41.90 11/23/2020 1056   CHOLHDL 2 11/23/2020 1056   VLDL 12.8 11/23/2020 1056   LDLCALC 34 11/23/2020 1056    LDLCALC 61 01/28/2020 1000    Physical Exam:   VS:  BP (!) 154/78   Pulse (!) 55   Ht  5\' 6"  (1.676 m)   Wt 159 lb 9.6 oz (72.4 kg)   SpO2 99%   BMI 25.76 kg/m    Wt Readings from Last 3 Encounters:  03/10/21 159 lb 9.6 oz (72.4 kg)  02/08/21 165 lb (74.8 kg)  12/03/20 159 lb 12.8 oz (72.5 kg)    General: Well nourished, well developed, in no acute distress Head: Atraumatic, normal size  Eyes: PEERLA, EOMI  Neck: Supple, no JVD Endocrine: No thryomegaly Cardiac: Normal S1, S2; RRR; no murmurs, rubs, or gallops Lungs: Clear to auscultation bilaterally, no wheezing, rhonchi or rales  Abd: Soft, nontender, no hepatomegaly  Ext: No edema, pulses 2+ Musculoskeletal: No deformities, BUE and BLE strength normal and equal Skin: Warm and dry, no rashes   Neuro: Alert and oriented to person, place, time, and situation, CNII-XII grossly intact, no focal deficits  Psych: Normal mood and affect   ASSESSMENT:   REEVE TURNLEY is a 75 y.o. male who presents for the following: 1. Chronic systolic heart failure ([place])   2. Hereditary cardiac amyloidosis (HCC)   3. Pericardial effusion   4. Coronary artery disease involving native coronary artery of native heart without angina pectoris   5. First degree AV block   6. Sinus bradycardia     PLAN:   1. Chronic systolic heart failure (HCC) -EF 40%.  Secondary to likely hypertension as well as combination of hereditary cardiac amyloidosis.  Appears to be euvolemic.  Bradycardic but tolerating Coreg 6.25 mg twice daily.  He is on hydralazine 50 mg 3 times daily.  He is also on Imdur 60 mg daily.  He cannot be on ACE/ARB/Arni/MRA given CKD stage III on 4.  Appears euvolemic.  He will follow with Dr. Marlou Porch moving forward. -He is also on SGLT2 inhibitor.  She will continue this.  2. Hereditary cardiac amyloidosis (HCC) -hereditary TTR amyloid -positive myocardial biopsy 09/27/2020 -Val142Ile mutation  -negative serum/urine immunofixation, negative  light chains  -We discussed his condition in office today.  He has several brothers with congestive heart failure.  They need to be screened for this condition.  He also has 1 son who is 33 years of age.  We briefly discussed over the phone that he needs to discuss possible further testing with a cardiologist.  He also has a sister who needs to be tested.  They will notify all family members and decide if they would like to pursue any testing.  I do think this could explain the strong predisposition to congestive heart failure in his family. -We also discussed starting tafamidis in office.  We will start this at 61 mg daily.  No further titration of the dose is needed.  We will need to get patient assistance through the cost of this drug.  For now we will discuss with pharmacy next best steps.  We will start this medication. -Moving forward he will need to be watched closely for conduction disease as well as atrial fibrillation.  It is reassuring that his blood pressure is elevated.  I think we have found this disease early enough to really make an impact with tafamidis.  Hopefully we can halt the progression of further protein buildup in the heart.  3. Pericardial effusion -Status post pericardial window.  No recurrence.  4. Coronary artery disease involving native coronary artery of native heart without angina pectoris -80% mid LAD stenosis.  No symptoms of angina.  Symptoms at that time were more contributed to congestive heart failure.  He  will continue aspirin and statin therapy.  Most recent LDL is at goal.  He is on a beta-blocker.  No symptoms of angina.  Would recommend medical management moving forward.  5. First degree AV block 6. Sinus bradycardia -Sinus bradycardia heart rate 50, very prolonged first-degree AV block up to 560 ms.  He is on beta-blocker for cardiomyopathy.  Tolerating this well.  This will need to be closely monitored.  May even consider pacing as beta-blocker is very  important to his therapy.  Given lack of symptoms I am okay to continue for now.  We will continue to keep a close eye on this.  He will follow-up with his primary cardiologist within [facility] on this.   Disposition: Return if symptoms worsen or fail to improve.  Medication Adjustments/Labs and Tests Ordered: Current medicines are reviewed at length with the patient today.  Concerns regarding medicines are outlined above.  No orders of the defined types were placed in this encounter.  No orders of the defined types were placed in this encounter.   Patient Instructions  Medication Instructions:  The current medical regimen is effective;  continue present plan and medications.  *If you need a refill on your cardiac medications before your next appointment, please call your pharmacy*   Follow-Up: At [facility], you and your health needs are our priority.  As part of our continuing mission to provide you with exceptional heart care, we have created designated Provider Care Teams.  These Care Teams include your primary Cardiologist (physician) and Advanced Practice Providers (APPs -  Physician Assistants and Nurse Practitioners) who all work together to provide you with the care you need, when you need it.  We recommend signing up for the patient portal called "MyChart".  Sign up information is provided on this After Visit Summary.  MyChart is used to connect with patients for Virtual Visits (Telemedicine).  Patients are able to view lab/test results, encounter notes, upcoming appointments, etc.  Non-urgent messages can be sent to your provider as well.   To learn more about what you can do with MyChart, go to [email or web address].    Your next appointment:   As needed  The format for your next appointment:   In Person  Provider:   Eleonore Chiquito, MD    Time Spent with Patient: I have spent a total of 35 minutes with patient reviewing hospital notes, telemetry, EKGs, labs and  examining the patient as well as establishing an assessment and plan that was discussed with the patient.  > 50% of time was spent in direct patient care.  Signed, Addison Naegeli. Audie Box, [place], [place]  [address], [place], [place] [address] [phone]  03/10/2021 10:14 AM

## 2021-03-10 ENCOUNTER — Encounter: Payer: Self-pay | Admitting: Cardiovascular Disease

## 2021-03-10 ENCOUNTER — Other Ambulatory Visit: Payer: Self-pay

## 2021-03-10 ENCOUNTER — Ambulatory Visit: Payer: Medicare HMO | Admitting: Cardiovascular Disease

## 2021-03-10 VITALS — BP 154/78 | HR 55 | Ht 66.0 in | Wt 159.6 lb

## 2021-03-10 DIAGNOSIS — R001 Bradycardia, unspecified: Secondary | ICD-10-CM

## 2021-03-10 DIAGNOSIS — I5022 Chronic systolic (congestive) heart failure: Secondary | ICD-10-CM

## 2021-03-10 DIAGNOSIS — I2510 Atherosclerotic heart disease of native coronary artery without angina pectoris: Secondary | ICD-10-CM

## 2021-03-10 DIAGNOSIS — I3139 Other pericardial effusion (noninflammatory): Secondary | ICD-10-CM | POA: Diagnosis not present

## 2021-03-10 DIAGNOSIS — E859 Amyloidosis, unspecified: Secondary | ICD-10-CM | POA: Diagnosis not present

## 2021-03-10 DIAGNOSIS — E854 Organ-limited amyloidosis: Secondary | ICD-10-CM | POA: Diagnosis not present

## 2021-03-10 DIAGNOSIS — I43 Cardiomyopathy in diseases classified elsewhere: Secondary | ICD-10-CM | POA: Diagnosis not present

## 2021-03-10 DIAGNOSIS — I440 Atrioventricular block, first degree: Secondary | ICD-10-CM | POA: Diagnosis not present

## 2021-03-10 NOTE — Patient Instructions (Signed)
Medication Instructions:  The current medical regimen is effective;  continue present plan and medications.  *If you need a refill on your cardiac medications before your next appointment, please call your pharmacy*    Follow-Up: At [facility], you and your health needs are our priority.  As part of our continuing mission to provide you with exceptional heart care, we have created designated Provider Care Teams.  These Care Teams include your primary Cardiologist (physician) and Advanced Practice Providers (APPs -  Physician Assistants and Nurse Practitioners) who all work together to provide you with the care you need, when you need it.  We recommend signing up for the patient portal called "MyChart".  Sign up information is provided on this After Visit Summary.  MyChart is used to connect with patients for Virtual Visits (Telemedicine).  Patients are able to view lab/test results, encounter notes, upcoming appointments, etc.  Non-urgent messages can be sent to your provider as well.   To learn more about what you can do with MyChart, go to [email or web address].    Your next appointment:   As needed  The format for your next appointment:   In Person  Provider:   Levelland O'Neal, MD      

## 2021-03-14 DIAGNOSIS — E1122 Type 2 diabetes mellitus with diabetic chronic kidney disease: Secondary | ICD-10-CM | POA: Diagnosis not present

## 2021-03-14 DIAGNOSIS — I10 Essential (primary) hypertension: Secondary | ICD-10-CM

## 2021-03-14 DIAGNOSIS — I25119 Atherosclerotic heart disease of native coronary artery with unspecified angina pectoris: Secondary | ICD-10-CM | POA: Diagnosis not present

## 2021-03-14 DIAGNOSIS — Z794 Long term (current) use of insulin: Secondary | ICD-10-CM

## 2021-03-14 DIAGNOSIS — I5040 Unspecified combined systolic (congestive) and diastolic (congestive) heart failure: Secondary | ICD-10-CM | POA: Diagnosis not present

## 2021-03-23 ENCOUNTER — Ambulatory Visit (INDEPENDENT_AMBULATORY_CARE_PROVIDER_SITE_OTHER): Payer: Medicare HMO | Admitting: *Deleted

## 2021-03-23 DIAGNOSIS — Z794 Long term (current) use of insulin: Secondary | ICD-10-CM

## 2021-03-23 DIAGNOSIS — I5040 Unspecified combined systolic (congestive) and diastolic (congestive) heart failure: Secondary | ICD-10-CM

## 2021-03-23 DIAGNOSIS — E1122 Type 2 diabetes mellitus with diabetic chronic kidney disease: Secondary | ICD-10-CM

## 2021-03-23 NOTE — Chronic Care Management (AMB) (Signed)
Chronic Care Management   CCM RN Visit Note  03/23/2021 Name: Corey [name] MRN: [id number] DOB: 06-04-1945  Subjective: Corey [name] is a 75 y.o. year old male who is a primary care patient of Burns, Corey Lick, MD. The care management team was consulted for assistance with disease management and care coordination needs.    Engaged with patient and his spouse Corey [name], on [facility] DPR by telephone for follow up visit in response to provider referral for case management and/or care coordination services.   Consent to Services:  The patient was given information about Chronic Care Management services, agreed to services, and gave verbal consent prior to initiation of services.  Please see initial visit note for detailed documentation.  Patient agreed to services and verbal consent obtained.   Assessment: Review of patient past medical history, allergies, medications, health status, including review of consultants reports, laboratory and other test data, was performed as part of comprehensive evaluation and provision of chronic care management services.   SDOH (Social Determinants of Health) assessments and interventions performed:  SDOH Interventions    Flowsheet Row Most Recent Value  SDOH Interventions   Food Insecurity Interventions Intervention Not Indicated  Housing Interventions Intervention Not Indicated  Transportation Interventions Intervention Not Indicated  [wife continues to report that she provides transportation for patient]       CCM Care Plan Allergies  Allergen Reactions   Hydrochlorothiazide Other (See Comments)    Gout , uncontrolled diabetes and renal insufficiency   Outpatient Encounter Medications as of 03/23/2021  Medication Sig Note   ACCU-CHEK AVIVA PLUS test strip TEST BLOOD SUGAR TWICE DAILY AS DIRECTED    ACCU-CHEK AVIVA PLUS test strip Use as instructed    albuterol (VENTOLIN HFA) 108 (90 Base) MCG/ACT inhaler Inhale 2 puffs into the lungs every 4 (four) hours as  needed for wheezing or shortness of breath.    allopurinol (ZYLOPRIM) 100 MG tablet Take 1/2 (one-half) tablet by mouth once daily    aspirin EC 81 MG tablet Take 1 tablet (81 mg total) by mouth daily.    bismuth subsalicylate (PEPTO BISMOL) 262 MG/15ML suspension Take 30 mLs by mouth every 6 (six) hours as needed for indigestion.    carvedilol (COREG) 6.25 MG tablet Take 1 tablet (6.25 mg total) by mouth 2 (two) times daily with a meal.    colchicine (COLCRYS) 0.6 MG tablet Take 2 tabs once and then one tab one hour later as needed for gout    empagliflozin (JARDIANCE) 25 MG TABS tablet Take 1 tablet (25 mg total) by mouth daily before breakfast. 02/17/2021: Via [employer] pt assistance   glimepiride (AMARYL) 1 MG tablet     hydrALAZINE (APRESOLINE) 50 MG tablet Take 2 tablets (100 mg total) by mouth 3 (three) times daily.    isosorbide mononitrate (IMDUR) 60 MG 24 hr tablet Take 2 tablets (120 mg total) by mouth daily.    isosorbide mononitrate (IMDUR) 60 MG 24 hr tablet Take 60 mg by mouth in the morning and at bedtime.    losartan (COZAAR) 100 MG tablet     nitroGLYCERIN (NITROSTAT) 0.4 MG SL tablet Place 1 tablet (0.4 mg total) under the tongue every 5 (five) minutes as needed for chest pain.    nitroGLYCERIN (NITROSTAT) 0.4 MG SL tablet Place under the tongue.    rosuvastatin (CRESTOR) 40 MG tablet Take 1 tablet (40 mg total) by mouth daily.    torsemide (DEMADEX) 20 MG tablet Take 1 tablet (20 mg  total) by mouth 2 (two) times a week. Mondays and Fridays 03/23/2021: 03/23/21- patient reports taking M-W-F   No facility-administered encounter medications on file as of 03/23/2021.   Patient Active Problem List   Diagnosis Date Noted   Bilateral hearing loss 01/04/2021   Bilateral impacted cerumen 01/04/2021   Tympanic membrane perforation, left 01/04/2021   Physical deconditioning 10/16/2020   Cardiac amyloidosis ([place]) 09/30/2020   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   Progressive  angina (HCC) - Atypical; DOE 09/20/2020   Coronary artery disease involving native coronary artery of native heart with angina pectoris ([place]) 09/20/2020   Acute on chronic combined systolic and diastolic CHF (congestive heart failure) ([place]) 09/20/2020   Acute combined systolic and diastolic heart failure ([place]) 09/20/2020   Soft tissue mass 07/30/2020   Contracture of joint of finger of left hand 07/30/2020   Acute respiratory disease due to COVID-19 virus 06/05/2019   DOE (dyspnea on exertion) 03/01/2019   Mild cognitive impairment 12/05/2018   Slurred speech 07/23/2018   Muscle twitching 07/23/2018   OSA (obstructive sleep apnea) 03/27/2018   Snores 01/21/2018   Poor balance 01/23/2017   Ankle swelling, left 09/06/2016   Bradycardia 01/28/2016   Weak urinary stream 12/31/2015   Stage 3b chronic kidney disease 09/15/2015   Ventral hernia without obstruction or gangrene 09/15/2015   Prostate cancer ([place]) 07/25/2015   Erectile dysfunction 05/04/2015   Urinary frequency 05/04/2015   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   Abnormal finding on MRI of brain 02/14/2015   PEA (Pulseless electrical activity) ([place]) 12/03/2012   Diabetes ([place]) 06/30/2011   Gout 03/05/2007   Essential hypertension 03/05/2007   Conditions to be addressed/monitored:  CHF and DMII  Care Plan : Heart Failure (Adult)  Updates made by Knox Royalty, RN since 03/23/2021 12:00 AM     Problem: Symptom Exacerbation (Heart Failure)   Priority: Medium     Long-Range Goal: Symptom Exacerbation Prevented or Minimized   Start Date: 11/04/2020  Expected End Date: 11/04/2021  This Visit's Progress: Not on track  Recent Progress: On track  Priority: Medium  Note:   Current Barriers:  Knowledge deficit related to basic heart failure pathophysiology and self care management Unable to independently manage medications: reports decreased vision due to glaucoma Limited health literacy, hard of  hearing in (L) ear: wife participates in all of patent medical affairs and is very supportive/ helpful Case Manager Clinical Goal(s):  11/04/20: Over the next 12 months, patient will verbalize understanding of Heart Failure Action Plan and when to call doctor, as evidenced by patient/ caregiver reporting during CCM RN CM outreach of: Completion of daily weight monitoring/ recording (notifying MD of 3 lb weight gain over night or 5 lb in a week) Adhering to low salt diet Taking prescribed medications as prescribed Interventions:  Collaboration with [name], MD regarding development and update of comprehensive plan of care as evidenced by provider attestation and co-signature Inter-disciplinary care team collaboration (see longitudinal plan of care Chart reviewed including relevant office notes, upcoming scheduled appointments, and lab results Discussed current  clinical condition with patient/ caregiver and confirmed no current clinical or medication concerns; confirmed patient's spouse continues to manage medications for patient due to decreased vision Continues trying to follow diabetic, low carb, sugar, salt diet: struggles frequently because he can't eat what others in the family do; however, wife continues trying to help patient adjust and keep in perspective benefit of following this diet-- however, patient readily admits  he is eating "extra salt;" wife is frustrated with patient's noncompliance with diet; patient was again strongly encouraged, with rationale provided, for adhering to low salt/ DM diet Confirmed continues to perform daily weight monitoring/ recording at home: reports today weight of "164 lbs" which is generally higher than his previously reported weight ranges between 150-160; continues to deny overnight weight gain > 3 lbs/ weekly > 5 lbs Reports lower extremity swelling "at baseline;" "not bad;" confirms taking diuretic M-W-F Reinforced previously provided education with  patient regarding signs/ symptoms yellow CHF zone along with corresponding action plan- patient requires ongoing reinforcement; wife verbalizes good understanding of same Confirmed no medication concerns today; reports taking medications as prescribed Reviewed 03/10/21 cardiology provider appointment- confirmed no medication changes; wife reports cardiologist is assisting in "getting him the experimental medication" he needs "for his heart;" she is asking me if I think patient will qualify for medicaid- explained to her the basic process; offered assistance in getting started by having [place] contact spouse- she is agreeable/ appreciative, states,"I don't know where to start;" reports occasional difficulty with using computer- requests mailing AVS to she/ patient Self-Care Activities: Patient verbalizes understanding of plan to continue monitoring/ recording daily weights at home Spouse administers medications as prescribed, due to patient vision issues Attends all scheduled provider appointments Calls pharmacy for medication refills Performs ADL's/ iADL's independently Calls provider office for new concerns or questions Patient Goals: [employer] job weighing yourself daily and write your weights at home down on paper or on the calendar- we will review these each time we have a telephone visit.  Today you reported a weight of 164 lbs-- this is higher than the weights you have previously reported Call office if I gain more than 2 pounds in one day or 5 pounds in one week, especially if this is associated with chest tightness, swelling in your legs/ ankles, and new or worsened shortness of breath Watch for swelling in feet, ankles and legs every day: follow the plan your cardiologist developed to increase your diuretic to 3 times a week if you begin to gain weight or have inccreased swelling in your legs and ankles Keep legs up while sitting, and continue walking for exercise when the  weather is nice outside Keep limiting the salt in your diet: please decrease your salt intake and do not add salt to your meals Follow Up Plan:  Telephone follow up appointment with care management team member scheduled for: Thursday, May 12, 2021 at 3:30 pm The patient has been provided with contact information for the care management team and has been advised to call with any health related questions or concerns     Care Plan : Diabetes Type 2 (Adult)  Updates made by Knox Royalty, RN since 03/23/2021 12:00 AM     Problem: Glycemic Management (Diabetes, Type 2)   Priority: Low     Long-Range Goal: Glycemic Management Optimized   Start Date: 11/04/2020  Expected End Date: 11/04/2021  This Visit's Progress: On track  Recent Progress: On track  Priority: Low  Note:   Objective:  Lab Results  Component Value Date   HGBA1C 6.4 (H) 09/21/2020   Lab Results  Component Value Date   CREATININE 2.29 (H) 10/27/2020   CREATININE 2.67 (H) 10/08/2020   CREATININE 2.34 (H) 09/30/2020   Lab Results  Component Value Date   EGFR 42 (L) 09/15/2020   Current Barriers:  Knowledge Deficits related to basic Diabetes pathophysiology and self care/management:  patient and spouse could benefit from ongoing reinforcement of established knowledge baseline Difficulty obtaining or cannot afford medications- reports difficult to afford [place] team active/ aware of same Unable to self administer medications as prescribed- reports wife manages medications due to poor vision Case Manager Clinical Goal(s):  11/04/20: Over the next 12 months, patient will demonstrate improved adherence to prescribed treatment plan for diabetes self care/management as evidenced by patient and careiver reporting during CCM RN CM outreach of:  Daily monitoring and recording of CBG   Adherence to ADA/ carb modified diet  Adherence to prescribed medication regimen  Contacting provider for new or worsened  symptoms or questions Interventions:  Collaboration with [name], MD regarding development and update of comprehensive plan of care as evidenced by provider attestation and co-signature Inter-disciplinary care team collaboration (see longitudinal plan of care) Review of patient status, including review of consultants reports, relevant laboratory and other test results, and medications completed Discussed current clinical condition with patient and spouse and confirmed no current clinical concerns Confirmed patient/ caregiver/ spouse continue working with CCM Pharmacist for PAP for [employer]- encouraged their ongoing engagement with CCM pharmacy team; they report they believe the PAP for jardiance has been successful, deny medication concerns Reviewed recent blood sugars at home with patient- he reports consistent fasting and post-prandial ranges between 90-130: positive reinforcement provided Provided education around management of low blood sugars at home: he reports occasional blood sugars as low as "68;" however, he denies signs/ symptoms hypoglycemia; education around same with corresponding action plan provided  Positive reinforcement provided for patient report that he has started walking in his neighborhood 2-3 times per week- benefits of regular activity/ exercise discussed with patient Continues trying to follow diabetic, low carb, sugar, salt diet: struggles frequently because he can't eat what others in the family do; however, wife continues trying to help patient adjust and keep in perspective benefit of following this diet-- however, patient readily admits he is eating "extra salt;" wife is frustrated with patient's noncompliance with diet; patient was again strongly encouraged, with rationale provided, for adhering to low salt/ DM diet Reviewed scheduled/upcoming provider appointments including: cardiology 04/04/21; CCM Pharmacy team 04/19/21; podiatry 04/27/21; Dr. Dwyane Dee,  endocrinology provider on 05/10/21 (labs 05/06/21) Discussed plans with patient for ongoing care management follow up and provided patient with direct contact information for care management team Self-Care Activities UNABLE to independently manage medications at home: Patient's spouse manages medications; they would like information on patient assistance resources for Jardiance Attends all scheduled provider appointments Checks blood sugars as instructed and calls care providers for concerns Adheres to prescribed ADA/carb modified Patient Goals:  Continue checking your blood sugar twice a day: first thing in the morning before you eat, and again at night after a meal Please continue writing your blood sugars down on paper so we can review these together during our future phone calls Check blood sugar extra during the day if you don't feel normal or feel it is too high or too low Today, you reported a fasting and after meal blood sugars of 90-130: these are right on target and where the blood sugars should be Please continue eating foods that are low in sugar and carbohydrates Take the blood sugars you write down on paper to all doctor visits- your doctors will be able to make sure you are on the right medications if they are able to see your blood sugars at home  [place] job lowering your A1-C: your last  A1-C on 02/08/21 was 6.8, which equals an approximate average blood sugar of 145 over 3 months Keep up the great work getting exercise by walking when the weather is nice Follow Up Plan:  Telephone follow up appointment with care management team member scheduled for: Thursday, May 12, 2021 at 3:30 pm The patient has been provided with contact information for the care management team and has been advised to call with any health related questions or concerns    Plan: The patient has been provided with contact information for the care management team and has been advised to call with any health  related questions or concerns   [name], RN, BSN, [facility] [phone]: direct office [phone]: mobile

## 2021-03-23 NOTE — Patient Instructions (Addendum)
Visit Information   [place] and [name], it was nice talking with you today.   Please read over the attached information, and start now to decrease/ eliminate extra salt from your diet   I look forward to talking to you again for a telephone update on Thursday, May 12, 2021 at 3:30 pm- please be listening out for my call that day.  I will call as close to 3:30 pm as possible.   If you need to cancel or re-schedule our telephone visit, please call [phone] and one of our care guides will be happy to assist you.   I look forward to hearing about your progress.   Please don't hesitate to contact me if I can be of assistance to you before our next scheduled telephone appointment.   [name], RN, BSN, [facility] RN Care Coordination- [place] [phone]: direct office [phone]: mobile   Patient Self-Care Activities: Patient Corey [name] will: For Diabetes management-- Continue checking your blood sugar twice a day: first thing in the morning before you eat, and again at night after a meal Please continue writing your blood sugars down on paper so we can review these together during our future phone calls Check blood sugar extra during the day if you don't feel normal or feel it is too high or too low Today, you reported a fasting and after meal blood sugars of 90-130: these are right on target and where the blood sugars should be Please continue eating foods that are low in sugar and carbohydrates Take the blood sugars you write down on paper to all doctor visits- your doctors will be able to make sure you are on the right medications if they are able to see your blood sugars at home  [place] job lowering your A1-C: your last A1-C on 02/08/21 was 6.8, which equals an approximate average blood sugar of 145 over 3 months Keep up the great work getting exercise by walking when the weather is nice  For heart failure management- Great job weighing yourself  daily and write your weights at home down on paper or on the calendar- we will review these each time we have a telephone visit.  Today you reported a weight of 164 lbs-- this is higher than the weights you have previously reported Call office if I gain more than 2 pounds in one day or 5 pounds in one week, especially if this is associated with chest tightness, swelling in your legs/ ankles, and new or worsened shortness of breath Watch for swelling in feet, ankles and legs every day: follow the plan your cardiologist developed to increase your diuretic to 3 times a week if you begin to gain weight or have inccreased swelling in your legs and ankles Keep legs up while sitting, and continue walking for exercise when the weather is nice outside Keep limiting the salt in your diet: please decrease your salt intake and do not add salt to your meals  Low-Sodium Eating Plan Sodium, which is an element that makes up salt, helps you maintain a healthy balance of fluids in your body. Too much sodium can increase your blood pressure and cause fluid and waste to be held in your body. Your health care provider or dietitian may recommend following this plan if you have high blood pressure (hypertension), kidney disease, liver disease, or heart failure. Eating less sodium can help lower your blood pressure, reduce swelling, and protect your heart, liver, and kidneys. What  are tips for following this plan? Reading food labels The Nutrition Facts label lists the amount of sodium in one serving of the food. If you eat more than one serving, you must multiply the listed amount of sodium by the number of servings. Choose foods with less than 140 mg of sodium per serving. Avoid foods with 300 mg of sodium or more per serving. Shopping  Look for lower-sodium products, often labeled as "low-sodium" or "no salt added." Always check the sodium content, even if foods are labeled as "unsalted" or "no salt added." Buy fresh  foods. Avoid canned foods and pre-made or frozen meals. Avoid canned, cured, or processed meats. Buy breads that have less than 80 mg of sodium per slice. Cooking  Eat more home-cooked food and less restaurant, buffet, and fast food. Avoid adding salt when cooking. Use salt-free seasonings or herbs instead of table salt or sea salt. Check with your health care provider or pharmacist before using salt substitutes. Cook with plant-based oils, such as canola, sunflower, or olive oil. Meal planning When eating at a restaurant, ask that your food be prepared with less salt or no salt, if possible. Avoid dishes labeled as brined, pickled, cured, smoked, or made with soy sauce, miso, or teriyaki sauce. Avoid foods that contain MSG (monosodium glutamate). MSG is sometimes added to [place] food, bouillon, and some canned foods. Make meals that can be grilled, baked, poached, roasted, or steamed. These are generally made with less sodium. General information Most people on this plan should limit their sodium intake to 1,500-2,000 mg (milligrams) of sodium each day. What foods should I eat? Fruits Fresh, frozen, or canned fruit. Fruit juice. Vegetables Fresh or frozen vegetables. "No salt added" canned vegetables. "No salt added" tomato sauce and paste. Low-sodium or reduced-sodium tomato and vegetable juice. Grains Low-sodium cereals, including oats, puffed wheat and rice, and shredded wheat. Low-sodium crackers. Unsalted rice. Unsalted pasta. Low-sodium bread. Whole-grain breads and whole-grain pasta. Meats and other proteins Fresh or frozen (no salt added) meat, poultry, seafood, and fish. Low-sodium canned tuna and salmon. Unsalted nuts. Dried peas, beans, and lentils without added salt. Unsalted canned beans. Eggs. Unsalted nut butters. Dairy Milk. Soy milk. Cheese that is naturally low in sodium, such as ricotta cheese, fresh mozzarella, or Swiss cheese. Low-sodium or reduced-sodium cheese. Cream  cheese. Yogurt. Seasonings and condiments Fresh and dried herbs and spices. Salt-free seasonings. Low-sodium mustard and ketchup. Sodium-free salad dressing. Sodium-free light mayonnaise. Fresh or refrigerated horseradish. Lemon juice. Vinegar. Other foods Homemade, reduced-sodium, or low-sodium soups. Unsalted popcorn and pretzels. Low-salt or salt-free chips. The items listed above may not be a complete list of foods and beverages you can eat. Contact a dietitian for more information. What foods should I avoid? Vegetables Sauerkraut, pickled vegetables, and relishes. Olives. [place] fries. Onion rings. Regular canned vegetables (not low-sodium or reduced-sodium). Regular canned tomato sauce and paste (not low-sodium or reduced-sodium). Regular tomato and vegetable juice (not low-sodium or reduced-sodium). Frozen vegetables in sauces. Grains Instant hot cereals. Bread stuffing, pancake, and biscuit mixes. Croutons. Seasoned rice or pasta mixes. Noodle soup cups. Boxed or frozen macaroni and cheese. Regular salted crackers. Self-rising flour. Meats and other proteins Meat or fish that is salted, canned, smoked, spiced, or pickled. Precooked or cured meat, such as sausages or meat loaves. Berniece Salines. Ham. Pepperoni. Hot dogs. Corned beef. Chipped beef. Salt pork. Jerky. Pickled herring. Anchovies and sardines. Regular canned tuna. Salted nuts. Dairy Processed cheese and cheese spreads. Hard cheeses. Cheese curds.  Blue cheese. Feta cheese. String cheese. Regular cottage cheese. Buttermilk. Canned milk. Fats and oils Salted butter. Regular margarine. Ghee. Bacon fat. Seasonings and condiments Onion salt, garlic salt, seasoned salt, table salt, and sea salt. Canned and packaged gravies. Worcestershire sauce. Tartar sauce. Barbecue sauce. Teriyaki sauce. Soy sauce, including reduced-sodium. Steak sauce. Fish sauce. Oyster sauce. Cocktail sauce. Horseradish that you find on the shelf. Regular ketchup and  mustard. Meat flavorings and tenderizers. Bouillon cubes. Hot sauce. Pre-made or packaged marinades. Pre-made or packaged taco seasonings. Relishes. Regular salad dressings. Salsa. Other foods Salted popcorn and pretzels. Corn chips and puffs. Potato and tortilla chips. Canned or dried soups. Pizza. Frozen entrees and pot pies. The items listed above may not be a complete list of foods and beverages you should avoid. Contact a dietitian for more information. Summary Eating less sodium can help lower your blood pressure, reduce swelling, and protect your heart, liver, and kidneys. Most people on this plan should limit their sodium intake to 1,500-2,000 mg (milligrams) of sodium each day. Canned, boxed, and frozen foods are high in sodium. Restaurant foods, fast foods, and pizza are also very high in sodium. You also get sodium by adding salt to food. Try to cook at home, eat more fresh fruits and vegetables, and eat less fast food and canned, processed, or prepared foods. This information is not intended to replace advice given to you by your health care provider. Make sure you discuss any questions you have with your health care provider. Document Revised: 06/06/2019 Document Reviewed: 04/02/2019 Elsevier Patient Education  2022 [employer]  The patient and his spouse verbalized understanding of instructions, educational materials, and care plan provided today and agreed to receive a mailed copy of patient instructions, educational materials, and care plan Telephone follow up appointment with care management team member scheduled for:  Thursday, May 12, 2021 at 3:30 pm The patient has been provided with contact information for the care management team and has been advised to call with any health related questions or concerns   [name], RN, BSN, [place] [phone]: direct office [phone]: mobile

## 2021-03-25 DIAGNOSIS — K4090 Unilateral inguinal hernia, without obstruction or gangrene, not specified as recurrent: Secondary | ICD-10-CM | POA: Diagnosis not present

## 2021-03-28 ENCOUNTER — Other Ambulatory Visit (INDEPENDENT_AMBULATORY_CARE_PROVIDER_SITE_OTHER): Payer: Medicare HMO

## 2021-03-28 ENCOUNTER — Telehealth: Payer: Self-pay | Admitting: Cardiovascular Disease

## 2021-03-28 DIAGNOSIS — I5022 Chronic systolic (congestive) heart failure: Secondary | ICD-10-CM | POA: Diagnosis not present

## 2021-03-28 NOTE — Telephone Encounter (Signed)
Pt was prescribed Vyndamax 61mg  on 03/10/21, pt was advised that he needed to be rejected before he can be approved for the script. Pt received the rejection letter in the mail today and he wants to know if it's okay if he can bring it to the office tomorrow. Please advise pt further

## 2021-04-01 ENCOUNTER — Telehealth: Payer: Self-pay | Admitting: *Deleted

## 2021-04-01 NOTE — Telephone Encounter (Signed)
   Telephone encounter was:  Successful.  04/01/2021 Name: Corey [name] MRN: [id number] DOB: 1945-11-18  [name] is a 75 y.o. year old male who is a primary care patient of Burns, [name], MD . The community resource team was consulted for assistance with Will mail a couple of medicaid applications , food banks , as well pstient wife understanding medicaid process and application provided contact inof for any additional assistance needed   Care guide performed the following interventions: Patient provided with information about care guide support team and interviewed to confirm resource needs Follow up call placed to community resources to determine status of patients referral.  Follow Up Plan:  No further follow up planned at this time. The patient has been provided with needed resources.  [place], Care Management  [phone] [address] , [place] [address] Email : [email or web address]

## 2021-04-02 ENCOUNTER — Other Ambulatory Visit: Payer: Self-pay | Admitting: Internal Medicine

## 2021-04-02 ENCOUNTER — Other Ambulatory Visit: Payer: Self-pay | Admitting: Endocrinology

## 2021-04-02 DIAGNOSIS — E1165 Type 2 diabetes mellitus with hyperglycemia: Secondary | ICD-10-CM

## 2021-04-03 DIAGNOSIS — I5020 Unspecified systolic (congestive) heart failure: Secondary | ICD-10-CM | POA: Insufficient documentation

## 2021-04-03 NOTE — Progress Notes (Addendum)
Cardiology Office Note:    Date:  04/04/2021   ID:  [name], DOB 1945-10-10, MRN [id number]  PCP:  [name], MD   [facility] Providers Cardiologist:  Candee Furbish, MD     Referring MD: [name], MD   Chief Complaint:  F/u for CHF    Patient Profile:   Corey [name] is a 75 y.o. 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 was admitted in May 2022 with symptoms felt to be c/w unstable angina.  Cardiac catheterization demonstrated severe LAD stenosis but he had very elevated filling pressures.  His symptoms were thought to be due to decompensated HF.  He was diuresed with improved symptoms.   EF was 40% on echocardiogram and he was noted to have a large pericardial effusion.  He underwent a pericardial window along with a myocardial bx.  This was c/w cardiac amyloidosis (likely AL).  An OP PYP scan was equivocal.  He had f/u with Dr. Marlou Porch post DC.  He had genetic testing done.  This was c/w ATTR amyloidosis.    He last seen by Dr. Audie Box to review the findings of his genetic testing and the dx of ATTR hereditary cardiac amyloidosis.  He was started on Tafamidis 61 mg once daily.  Dr. Audie Box d/w the pt the need for his family to also seek testing.  He has several  family members with a hx of congestive heart failure.   He was noted to have a PR interval of 560 ms but was asymptomatic.  As beta-blocker Rx is important for his management, his carvedilol was continued.  He will need to be watched closely for high grade HB and atrial fibrillation.    He returns for f/u.  He is here today with his wife.  Overall, he has been doing well.  He has not had chest pain, syncope, orthopnea, PND, significant leg edema.  He feels that his breathing is fairly stable.  He has had a gradual weight increase and he has been taking his diuretic 3 times a week.  ASSESSMENT & PLAN:   HFrEF (heart failure with reduced ejection fraction) (HCC) EF 40% by limited echo in 09/2020.  He is not on ARB/ACE inhibitor/ARN I/MRA.  Initially, we had losartan 100 mg twice daily on his medication list.  However, we were able to reach his son at home who reviewed his medicines.  He is no longer on losartan.  His weight is gone up recently.  However, on exam, he seems to be euvolemic.  NYHA IIb.  Continue hydralazine 100 mg 3 times a day, carvedilol 6.25 mg twice daily, isosorbide 120 mg daily, empagliflozin 25 mg daily.  Continue current dose of torsemide 20 mg 3 times a week.  Follow-up with Dr. Marlou Porch in 3 months.  Hereditary cardiac amyloidosis (  [place]) Genetic testing was positive for ATTR amyloid.  He is currently going through the approval process for tafamidis 61 mg daily.  He has not heard anything back yet from Dr. Kathalene Frames nurse.  I will check back with Dr. Audie Box to see about the status of his medication.  He notes that his son is getting tested soon.  CAD (coronary artery disease) Cardiac catheterization in 5/22 with 80% mid LAD stenosis.  He has been managed medically.  He is currently doing well without anginal symptoms.  Continue aspirin 81 mg daily, rosuvastatin 40 mg daily.  Essential hypertension Repeat blood pressure by me 128/70.  He notes systolic blood pressures at home in the  130s.  Continue carvedilol 6.25 mg twice daily, hydralazine 100 mg 3 times a day, isosorbide 120 mg daily.  Bradycardia History of long first-degree block.  He is currently tolerating beta-blocker therapy.  He has not had any syncope or near syncope.  Stage 3b chronic kidney disease He does not see a nephrologist.  Recent creatinine 2.07.  Preoperative cardiovascular examination [name] perioperative risk of a major cardiac event is 11% according to the Revised Cardiac Risk Index (RCRI).  Therefore, he is at high risk for perioperative complications.   His functional capacity is fair at 4.19 METs according to the [place] Activity Status Index (DASI). Recommendations: According to ACC/AHA guidelines, no further cardiovascular testing needed.  I will review his case further with Dr. Marlou Porch and/or Dr. Audie Box prior to making final recommendations.  Addendum: I did review his case with Dr. Audie Box and Dr. Marlou Porch.  As outlined, it is felt that he will be high (but not prohibitive) risk for any procedure.  Close attention needs to be paid to volume status to avoid volume overload.  No further testing is needed.  It is reasonable to proceed with surgery as planned.  Our service is available as needed.  Antiplatelet and/or Anticoagulation Recommendations: Aspirin can be held for 7 days prior to his surgery.  Please resume Aspirin post operatively when it is felt to be safe from a bleeding standpoint.   OSA (obstructive sleep apnea) He has difficulty tolerating his facemask and has not been using his CPAP.  I will refer him to Dr. Radford Pax for further management.            Dispo:  Return in about 3 months (around 07/05/2021) for Routine follow up in 3 months with Dr.Skains. .    Prior CV studies: Genetic testing 02/01/21   PYP Scan 12/31/20 Findings are equivocal for TTR amyloidosis.  Visual score is 1 and H/CLL ratio is 1.4.    Limited Echocardiogram 09/30/20 Trace pericardial fluid, EF 40,  global HK, severe LVH, mod reduced RVSF, severely increased RV wall thickness, trivial MR  Echocardiogram 09/21/20 No LV clot, EF 35-40, global HK (severe in ant and ant-lat and apical walls), severe LVH, Gr 2 DD, mod reduced RVSF, mildly increased RV wall thickness, mod elevated PASP, severe LAE, mild RAE, large pericardial effusion w/o tamponade, mild MR  Cardiac catheterization 09/20/20 LAD mid 80 RI 20  LCx normal RCA normal  PCWP 28-30; LVEDP 32     Past Medical History:  Diagnosis Date   Cataract    CHF (congestive heart failure) (HCC)    Chronic heart failure with preserved ejection fraction (HFpEF) ([place])    Colon polyp 2003   Dr [name]; F/U was to be 2008( not completed)   CVA (cerebral infarction) 2014   Diabetes mellitus  Diverticulosis 2003   Dyspnea    with exertion   Gout    Hypertension    Myocardial infarction ([place]) 2014   ?? PEA cardiac arrest   OSA (obstructive sleep apnea) 03/27/2018   Pneumonia 09/29/2011   Avelox X 10 days as OP   Prostate cancer ([place])    Renal insufficiency    Seizures ([place])    not treated for seizure disorder; had a seizure after stroke 2014; no seizure since then   Current Medications: Current Meds  Medication Sig   ACCU-CHEK AVIVA PLUS test strip TEST BLOOD SUGAR TWICE DAILY AS DIRECTED   ACCU-CHEK AVIVA PLUS test strip Use as instructed   albuterol (VENTOLIN HFA) 108 (90 Base) MCG/ACT inhaler Inhale 2 puffs into the lungs every 4 (four) hours as needed for wheezing or shortness of breath.   Alcohol Swabs (DROPSAFE ALCOHOL PREP) 70 % PADS USE 4 (FOUR) TIMES DAILY   allopurinol (ZYLOPRIM) 100 MG tablet Take 1/2 (one-half) tablet by mouth once daily   aspirin EC 81 MG tablet Take 1 tablet (81 mg total) by mouth daily.   bismuth subsalicylate (PEPTO BISMOL) 262 MG/15ML suspension Take 30 mLs by mouth every 6 (six) hours as needed for indigestion.   carvedilol (COREG) 6.25 MG tablet Take 1 tablet (6.25 mg total) by mouth 2 (two)  times daily with a meal.   colchicine (COLCRYS) 0.6 MG tablet Take 2 tabs once and then one tab one hour later as needed for gout   empagliflozin (JARDIANCE) 25 MG TABS tablet Take 1 tablet (25 mg total) by mouth daily before breakfast.   glimepiride (AMARYL) 1 MG tablet Take 1/2 tablet (0.5 mg total) by mouth daily at bedtime   hydrALAZINE (APRESOLINE) 50 MG tablet Take 2 tablets (100 mg total) by mouth 3 (three) times daily.   isosorbide mononitrate (IMDUR) 60 MG 24 hr tablet Take 2 tablets (120 mg total) by mouth daily.   nitroGLYCERIN (NITROSTAT) 0.4 MG SL tablet Place 0.4 mg under the tongue every 5 (five) minutes as needed for chest pain.   rosuvastatin (CRESTOR) 40 MG tablet Take 1 tablet (40 mg total) by mouth daily.   torsemide (DEMADEX) 20 MG tablet Take 1 tablet (20 mg total) by mouth 2 (two) times a week. Mondays and Fridays   TRUEplus Lancets 33G MISC USE 4 (FOUR) TIMES DAILY   [DISCONTINUED] losartan (COZAAR) 100 MG tablet Take 100 mg by mouth 2 (two) times daily.    Allergies:   Hydrochlorothiazide   Social History   Tobacco Use   Smoking status: Former    Packs/day: 0.25    Years: 1.00    Pack years: 0.25    Types: Cigarettes    Quit date: 05/15/1968    Years since quitting: 52.9   Smokeless tobacco: Never   Tobacco comments:    smoked [place], up to 1-2 cigarettes/ day  Vaping Use   Vaping Use: Never used  Substance Use Topics   Alcohol use: No    Alcohol/week: 0.0 standard drinks   Drug use: No    Family Hx: The patient's family history includes Cancer in his father; Diabetes in his father and mother; Heart failure in his brother and mother; Hypertension in his father and mother; Prostate cancer in his father. There is no history of Stroke, Heart disease, or Colon cancer.  Review of Systems  Gastrointestinal:  Negative for hematochezia.  Genitourinary:  Negative for hematuria.    EKGs/Labs/Other Test Reviewed:    EKG:  EKG is not ordered today.  The ekg  ordered today demonstrates n/a  Recent Labs: 05/06/2020: B Natriuretic Peptide 1,524.0 09/27/2020: TSH 1.520 09/29/2020: Hemoglobin 13.6; Platelets 218 02/07/2021: ALT 51; BUN 37; Creatinine, Ser 2.07; Potassium 4.3; Sodium 142   Recent Lipid Panel Lab Results  Component Value Date/Time   CHOL 88 11/23/2020 10:56 AM   TRIG 64.0 11/23/2020 10:56 AM   HDL 41.90 11/23/2020 10:56 AM   LDLCALC 34 11/23/2020 10:56 AM   LDLCALC 61 01/28/2020 10:00 AM     Risk Assessment/Calculations:          Physical Exam:    VS:  BP (!) 150/80   Pulse 72   Ht 5\' 6"  (1.676 m)   Wt 167 lb 9.6 oz (76 kg)   SpO2 97%   BMI 27.05 kg/m     Wt Readings from Last 3 Encounters:  04/04/21 167 lb 9.6 oz (76 kg)  03/10/21 159 lb 9.6 oz (72.4 kg)  02/08/21 165 lb (74.8 kg)    Constitutional:      Appearance: Healthy appearance. Not in distress.  Neck:     Vascular: No JVR. JVD normal.  Pulmonary:     Effort: Pulmonary effort is normal.     Breath sounds: No wheezing. No rales.  Cardiovascular:     Normal rate. Regular rhythm. Normal S1. Normal S2.      Murmurs: There is no murmur.  Edema:    Peripheral edema absent.  Abdominal:     Palpations: Abdomen is soft. There is no hepatomegaly.  Skin:    General: Skin is warm and dry.  Neurological:     General: No focal deficit present.     Mental Status: Alert and oriented to person, place and time.     Cranial Nerves: Cranial nerves are intact.       Medication Adjustments/Labs and Tests Ordered: Current medicines are reviewed at length with the patient today.  Concerns regarding medicines are outlined above.  Tests Ordered: No orders of the defined types were placed in this encounter.  Medication Changes: No orders of the defined types were placed in this encounter.  Signed, Richardson Dopp, PA-C  04/04/2021 1:05 PM    [facility] [address], [place], [place]  [address] Phone: [phone]; Fax: [phone]

## 2021-04-04 ENCOUNTER — Encounter: Payer: Self-pay | Admitting: Physician Assistant

## 2021-04-04 ENCOUNTER — Other Ambulatory Visit: Payer: Self-pay

## 2021-04-04 ENCOUNTER — Ambulatory Visit: Payer: Medicare HMO | Admitting: Physician Assistant

## 2021-04-04 VITALS — BP 150/80 | HR 72 | Ht 66.0 in | Wt 167.6 lb

## 2021-04-04 DIAGNOSIS — I2510 Atherosclerotic heart disease of native coronary artery without angina pectoris: Secondary | ICD-10-CM | POA: Diagnosis not present

## 2021-04-04 DIAGNOSIS — Z01810 Encounter for preprocedural cardiovascular examination: Secondary | ICD-10-CM

## 2021-04-04 DIAGNOSIS — N1832 Chronic kidney disease, stage 3b: Secondary | ICD-10-CM

## 2021-04-04 DIAGNOSIS — G4733 Obstructive sleep apnea (adult) (pediatric): Secondary | ICD-10-CM | POA: Diagnosis not present

## 2021-04-04 DIAGNOSIS — I5020 Unspecified systolic (congestive) heart failure: Secondary | ICD-10-CM | POA: Diagnosis not present

## 2021-04-04 DIAGNOSIS — R001 Bradycardia, unspecified: Secondary | ICD-10-CM | POA: Diagnosis not present

## 2021-04-04 DIAGNOSIS — I43 Cardiomyopathy in diseases classified elsewhere: Secondary | ICD-10-CM

## 2021-04-04 DIAGNOSIS — E854 Organ-limited amyloidosis: Secondary | ICD-10-CM | POA: Diagnosis not present

## 2021-04-04 DIAGNOSIS — I10 Essential (primary) hypertension: Secondary | ICD-10-CM

## 2021-04-04 NOTE — Assessment & Plan Note (Addendum)
EF 40% by limited echo in 09/2020.  He is not on ARB/ACE inhibitor/ARN I/MRA.  Initially, we had losartan 100 mg twice daily on his medication list.  However, we were able to reach his son at home who reviewed his medicines.  He is no longer on losartan.  His weight is gone up recently.  However, on exam, he seems to be euvolemic.  NYHA IIb.  Continue hydralazine 100 mg 3 times a day, carvedilol 6.25 mg twice daily, isosorbide 120 mg daily, empagliflozin 25 mg daily.  Continue current dose of torsemide 20 mg 3 times a week.  Follow-up with Dr. Marlou Porch in 3 months.

## 2021-04-04 NOTE — Assessment & Plan Note (Signed)
Repeat blood pressure by me 128/70.  He notes systolic blood pressures at home in the 130s.  Continue carvedilol 6.25 mg twice daily, hydralazine 100 mg 3 times a day, isosorbide 120 mg daily.

## 2021-04-04 NOTE — Assessment & Plan Note (Addendum)
Mr. Diemer perioperative risk of a major cardiac event is 11% according to the Revised Cardiac Risk Index (RCRI).  Therefore, he is at high risk for perioperative complications.   His functional capacity is fair at 4.19 METs according to the Duke Activity Status Index (DASI). Recommendations: According to ACC/AHA guidelines, no further cardiovascular testing needed.  I will review his case further with Dr. Marlou Porch and/or Dr. Audie Box prior to making final recommendations.  Addendum: I did review his case with Dr. Audie Box and Dr. Marlou Porch.  As outlined, it is felt that he will be high (but not prohibitive) risk for any procedure.  Close attention needs to be paid to volume status to avoid volume overload.  No further testing is needed.  It is reasonable to proceed with surgery as planned.  Our service is available as needed.  Antiplatelet and/or Anticoagulation Recommendations: Aspirin can be held for 7 days prior to his surgery.  Please resume Aspirin post operatively when it is felt to be safe from a bleeding standpoint.

## 2021-04-04 NOTE — Telephone Encounter (Signed)
Paperwork for Vynda[employer] has been submitted.  Insurance denial was only for 90 day supply.  Was approved thru 12/23 for 30 day limit/fill

## 2021-04-04 NOTE — Assessment & Plan Note (Signed)
He does not see a nephrologist.  Recent creatinine 2.07.

## 2021-04-04 NOTE — Assessment & Plan Note (Signed)
He has difficulty tolerating his facemask and has not been using his CPAP.  I will refer him to Dr. Radford Pax for further management.

## 2021-04-04 NOTE — Assessment & Plan Note (Signed)
Cardiac catheterization in 5/22 with 80% mid LAD stenosis.  He has been managed medically.  He is currently doing well without anginal symptoms.  Continue aspirin 81 mg daily, rosuvastatin 40 mg daily.

## 2021-04-04 NOTE — Assessment & Plan Note (Signed)
History of long first-degree block.  He is currently tolerating beta-blocker therapy.  He has not had any syncope or near syncope.

## 2021-04-04 NOTE — Assessment & Plan Note (Addendum)
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  He is currently going through the approval process for tafamidis 61 mg daily.  He has not heard anything back yet from Dr. Kathalene Frames nurse.  I will check back with Dr. Audie Box to see about the status of his medication.  He notes that his son is getting tested soon.

## 2021-04-04 NOTE — Patient Instructions (Signed)
Medication Instructions:   Your physician recommends that you continue on your current medications as directed. Please refer to the Current Medication list given to you today.  *If you need a refill on your cardiac medications before your next appointment, please call your pharmacy*   Lab Work:  -NONE  If you have labs (blood work) drawn today and your tests are completely normal, you will receive your results only by: [place] (if you have MyChart) OR A paper copy in the mail If you have any lab test that is abnormal or we need to change your treatment, we will call you to review the results.   Testing/Procedures:  -NONE   Follow-Up: At [facility], you and your health needs are our priority.  As part of our continuing mission to provide you with exceptional heart care, we have created designated Provider Care Teams.  These Care Teams include your primary Cardiologist (physician) and Advanced Practice Providers (APPs -  Physician Assistants and Nurse Practitioners) who all work together to provide you with the care you need, when you need it.  We recommend signing up for the patient portal called "MyChart".  Sign up information is provided on this After Visit Summary.  MyChart is used to connect with patients for Virtual Visits (Telemedicine).  Patients are able to view lab/test results, encounter notes, upcoming appointments, etc.  Non-urgent messages can be sent to your provider as well.   To learn more about what you can do with MyChart, go to [email or web address].    Your next appointment:   3 month(s)  The format for your next appointment:   In Person  Provider:   Candee Furbish, MD     Other Instructions

## 2021-04-05 ENCOUNTER — Telehealth: Payer: Self-pay | Admitting: *Deleted

## 2021-04-05 NOTE — Telephone Encounter (Signed)
Patient stopped using his cpap at least 3 months ago and may need to start over with a new sleep study.

## 2021-04-05 NOTE — Telephone Encounter (Signed)
-----   Message from Lauralee Evener, [place] sent at 04/04/2021  3:18 PM EST -----  ----- Message ----- From: Tamsen Snider Sent: 04/04/2021   2:31 PM EST To: Cv Div Sleep Studies  Otelia Santee,  This pt was seen by [name] today and is having problems with his C-PAP. We do not know who he sees and either does he.  Can you call pt and get him in with Dr. Radford Pax please.   Thanks [employer]

## 2021-04-09 ENCOUNTER — Other Ambulatory Visit: Payer: Self-pay | Admitting: Internal Medicine

## 2021-04-11 ENCOUNTER — Telehealth: Payer: Self-pay | Admitting: *Deleted

## 2021-04-11 DIAGNOSIS — G4733 Obstructive sleep apnea (adult) (pediatric): Secondary | ICD-10-CM

## 2021-04-11 NOTE — Telephone Encounter (Signed)
-----   Message from Sueanne Margarita, MD sent at 04/05/2021 10:27 PM EST ----- Regarding: RE: Home or in Lab study?? He had moderate sleep apnea on sleep study - please order CPAP titraion ----- Message ----- From: Freada Bergeron, CMA Sent: 04/05/2021   3:19 PM EST To: Sueanne Margarita, MD Subject: Home or in Lab study??                         Patient stopped using his machine at least months ago because the pressure is too strong. Patient had a Home sleep study 04/01/2018.  Please advise ----- Message ----- From: Lauralee Evener, CMA Sent: 04/04/2021   3:18 PM EST To: Freada Bergeron, CMA   ----- Message ----- From: Tamsen Snider Sent: 04/04/2021   2:31 PM EST To: Cv Div Sleep Studies  Otelia Santee,  This pt was seen by [name] today and is having problems with his C-PAP. We do not know who he sees and either does he.  Can you call pt and get him in with Dr. Radford Pax please.   Thanks [employer]

## 2021-04-11 NOTE — Telephone Encounter (Signed)
Per dr Radford Pax order a cpap titration.

## 2021-04-13 DIAGNOSIS — I5040 Unspecified combined systolic (congestive) and diastolic (congestive) heart failure: Secondary | ICD-10-CM

## 2021-04-13 DIAGNOSIS — E1159 Type 2 diabetes mellitus with other circulatory complications: Secondary | ICD-10-CM

## 2021-04-13 DIAGNOSIS — Z794 Long term (current) use of insulin: Secondary | ICD-10-CM | POA: Diagnosis not present

## 2021-04-13 NOTE — Telephone Encounter (Signed)
Prior Authorization for titration sent to [facility] via web portal. Auth Number [id number].  Valid dates-04/18/21---05/18/21

## 2021-04-15 DIAGNOSIS — E8581 Light chain (AL) amyloidosis: Secondary | ICD-10-CM | POA: Diagnosis not present

## 2021-04-15 DIAGNOSIS — E1122 Type 2 diabetes mellitus with diabetic chronic kidney disease: Secondary | ICD-10-CM | POA: Diagnosis not present

## 2021-04-15 DIAGNOSIS — I129 Hypertensive chronic kidney disease with stage 1 through stage 4 chronic kidney disease, or unspecified chronic kidney disease: Secondary | ICD-10-CM | POA: Diagnosis not present

## 2021-04-15 DIAGNOSIS — I3139 Other pericardial effusion (noninflammatory): Secondary | ICD-10-CM | POA: Diagnosis not present

## 2021-04-15 DIAGNOSIS — I5042 Chronic combined systolic (congestive) and diastolic (congestive) heart failure: Secondary | ICD-10-CM | POA: Diagnosis not present

## 2021-04-15 DIAGNOSIS — N1832 Chronic kidney disease, stage 3b: Secondary | ICD-10-CM | POA: Diagnosis not present

## 2021-04-19 ENCOUNTER — Other Ambulatory Visit: Payer: Self-pay

## 2021-04-19 ENCOUNTER — Telehealth: Payer: Medicare HMO

## 2021-04-19 NOTE — Progress Notes (Unsigned)
Chronic Care Management Pharmacy Note  04/19/2021 Name:  Corey [name] MRN:  [id number] DOB:  May 03, 1946  Summary: -Pt had mixed Jardiance 10 and 25 mg tablets in one bottle by mistake and was taking 2 per day  -Pt reports weight is 167 lbs (dry weight ~158 lbs) and BP is higher than usual, endorses excess swelling; he reports his cardiologist has told him previously to take extra torsemide if he gains weight like this -Pt thinks he has a gout flare in his hand/pinky - he has already taken 2 colchicine tablets and noticed an improvement in swelling/pain  BP Readings from Last 3 Encounters:  04/04/21 (!) 150/80  03/10/21 (!) 154/78  02/08/21 (!) 142/78     Recommendations/Changes made from today's visit: -Pt will remove Jardiance 10 mg tablets (round) and just take 25 mg tablet (oblong) 1 per day -Advised to increase torsemide to 3x per week until cardiology f/u 10/27 -Advised not to take any more colchicine this week due to kidney impairment; call PCP if no improvement   Subjective: Corey [name] is an 75 y.o. year old male who is a primary patient of Burns, [name], MD.  The CCM team was consulted for assistance with disease management and care coordination needs.    Engaged with patient by telephone for follow up visit in response to provider referral for pharmacy case management and/or care coordination services.   Consent to Services:  The patient was given information about Chronic Care Management services, agreed to services, and gave verbal consent prior to initiation of services.  Please see initial visit note for detailed documentation.   Patient Care Team: [name], MD as PCP - General (Internal Medicine) Jerline Pain, MD as PCP - Cardiology (Cardiology) Charlton Haws, [facility] as Pharmacist (Pharmacist) Knox Royalty, RN as Case Manager  Recent office visits: 02/09/21 pt message - PCP referred to nephrologist.  10/15/20 [name] OV: hospital f/u; referred  to home health  Recent consult visits: 04/04/2021 - Richardson Dopp PA-C - cardiology - no changes to medications - follow up with Dr. Marlou Porch in 3 months   Hospital visits: -None since last visit    Objective:  Lab Results  Component Value Date   CREATININE 2.07 (H) 02/07/2021   BUN 37 (H) 02/07/2021   GFR 31.34 (L) 02/07/2021   GFRNONAA 30 (L) 10/27/2020   GFRAA 46 (L) 01/28/2020   NA 142 02/07/2021   K 4.3 02/07/2021   CALCIUM 9.8 02/07/2021   CO2 24 02/07/2021   GLUCOSE 104 (H) 02/07/2021    Lab Results  Component Value Date/Time   HGBA1C 6.8 (H) 02/07/2021 09:23 AM   HGBA1C 7.2 (H) 11/23/2020 10:56 AM   FRUCTOSAMINE 338 (H) 01/03/2021 08:41 AM   FRUCTOSAMINE 287 (H) 03/01/2020 09:09 AM   GFR 31.34 (L) 02/07/2021 09:23 AM   GFR 33.28 (L) 01/03/2021 08:41 AM   MICROALBUR 8.5 (H) 11/23/2020 10:56 AM   MICROALBUR 2.4 (H) 03/03/2020 11:09 AM    Last diabetic Eye exam:  Lab Results  Component Value Date/Time   HMDIABEYEEXA No Retinopathy 05/27/2018 12:00 AM    Last diabetic Foot exam: No results found for: HMDIABFOOTEX   Lab Results  Component Value Date   CHOL 88 11/23/2020   HDL 41.90 11/23/2020   LDLCALC 34 11/23/2020   TRIG 64.0 11/23/2020   CHOLHDL 2 11/23/2020    Hepatic Function Latest Ref Rng & Units 02/07/2021 11/23/2020 09/23/2020  Total Protein 6.0 - 8.3  g/dL 6.5 7.1 6.2(L)  Albumin 3.5 - 5.2 g/dL 3.7 4.1 3.3(L)  AST 0 - 37 U/L 45(H) 35 25  ALT 0 - 53 U/L 51 20 16  Alk Phosphatase 39 - 117 U/L 90 64 67  Total Bilirubin 0.2 - 1.2 mg/dL 0.7 0.6 1.3(H)  Bilirubin, Direct 0.0 - 0.3 mg/dL - - -    Lab Results  Component Value Date/Time   TSH 1.520 09/27/2020 08:46 AM   TSH 0.243 (L) 06/06/2019 03:43 AM   TSH 1.13 01/23/2017 11:48 AM   TSH 1.06 12/03/2012 11:43 AM   FREET4 1.02 09/27/2020 08:46 AM   FREET4 0.82 06/06/2019 03:43 AM    CBC Latest Ref Rng & Units 09/29/2020 09/28/2020 09/27/2020  WBC 4.0 - 10.5 K/uL 8.2 9.8 5.1  Hemoglobin 13.0 - 17.0  g/dL 13.6 13.4 14.5  Hematocrit 39.0 - 52.0 % 44.0 42.4 45.2  Platelets 150 - 400 K/uL 218 223 196    No results found for: VD25OH  Clinical ASCVD: Yes  The ASCVD Risk score (Arnett DK, et al., 2019) failed to calculate for the following reasons:   The patient has a prior MI or stroke diagnosis    Depression screen [facility] 2/9 11/04/2020 07/30/2020 06/12/2019  Decreased Interest 0 0 0  Down, Depressed, Hopeless 0 0 0  PHQ - 2 Score 0 0 0  Some recent data might be hidden    Social History   Tobacco Use  Smoking Status Former   Packs/day: 0.25   Years: 1.00   Pack years: 0.25   Types: Cigarettes   Quit date: 05/15/1968   Years since quitting: 52.9  Smokeless Tobacco Never  Tobacco Comments   smoked [place], up to 1-2 cigarettes/ day   BP Readings from Last 3 Encounters:  04/04/21 (!) 150/80  03/10/21 (!) 154/78  02/08/21 (!) 142/78   Pulse Readings from Last 3 Encounters:  04/04/21 72  03/10/21 (!) 55  02/08/21 (!) 53   Wt Readings from Last 3 Encounters:  04/04/21 167 lb 9.6 oz (76 kg)  03/10/21 159 lb 9.6 oz (72.4 kg)  02/08/21 165 lb (74.8 kg)   BMI Readings from Last 3 Encounters:  04/04/21 27.05 kg/m  03/10/21 25.76 kg/m  02/08/21 26.63 kg/m    Assessment/Interventions: Review of patient past medical history, allergies, medications, health status, including review of consultants reports, laboratory and other test data, was performed as part of comprehensive evaluation and provision of chronic care management services.   SDOH:  (Social Determinants of Health) assessments and interventions performed: Yes  SDOH Screenings   Alcohol Screen: Not on file  Depression (PHQ2-9): Low Risk    PHQ-2 Score: 0  Financial Resource Strain: Medium Risk   Difficulty of Paying Living Expenses: Somewhat hard  Food Insecurity: No Food Insecurity   Worried About Charity fundraiser in the Last Year: Never true   Ran Out of Food in the Last Year: Never true  Housing: Low  Risk    Last Housing Risk Score: 0  Physical Activity: Not on file  Social Connections: Not on file  Stress: Not on file  Tobacco Use: Medium Risk   Smoking Tobacco Use: Former   Smokeless Tobacco Use: Never   Passive Exposure: Not on file  Transportation Needs: No Transportation Needs   Lack of Transportation (Medical): No   Lack of Transportation (Non-Medical): No    CCM Care Plan  Allergies  Allergen Reactions   Hydrochlorothiazide Other (See Comments)  Gout , uncontrolled diabetes and renal insufficiency    Medications Reviewed Today     Reviewed by Sharmon Revere (Physician Assistant) on 04/04/21 at 1303  Med List Status: <None>   Medication Order Taking? Sig Documenting Provider Last Dose Status Informant  ACCU-CHEK AVIVA PLUS test strip [id number] Yes TEST BLOOD SUGAR TWICE DAILY AS DIRECTED Elayne Snare, MD Taking Active   ACCU-CHEK AVIVA PLUS test strip [id number] Yes Use as instructed Elayne Snare, MD Taking Active   albuterol (VENTOLIN HFA) 108 (90 Base) MCG/ACT inhaler [id number] Yes Inhale 2 puffs into the lungs every 4 (four) hours as needed for wheezing or shortness of breath. Janith Lima, MD Taking Active Self  Alcohol Swabs (DROPSAFE ALCOHOL PREP) 70 % PADS [id number] Yes USE 4 (FOUR) TIMES DAILY Elayne Snare, MD Taking Active   allopurinol (ZYLOPRIM) 100 MG tablet [id number] Yes Take 1/2 (one-half) tablet by mouth once daily [name], MD Taking Active   aspirin EC 81 MG tablet [id number] Yes Take 1 tablet (81 mg total) by mouth daily. [name], MD Taking Active   bismuth subsalicylate (PEPTO BISMOL) 262 MG/15ML suspension [id number] Yes Take 30 mLs by mouth every 6 (six) hours as needed for indigestion. [provider] Taking Active Self  carvedilol (COREG) 6.25 MG tablet [id number] Yes Take 1 tablet (6.25 mg total) by mouth 2 (two) times daily with a meal. Burns, [name], MD Taking Active   colchicine (COLCRYS) 0.6 MG tablet [id number] Yes  Take 2 tabs once and then one tab one hour later as needed for gout [name], MD Taking Active   empagliflozin (JARDIANCE) 25 MG TABS tablet [id number] Yes Take 1 tablet (25 mg total) by mouth daily before breakfast. Jerline Pain, MD Taking Active            Med Note Briant Cedar   Mon Apr 04, 2021 11:28 AM)    glimepiride (AMARYL) 1 MG tablet [id number] Yes Take 1/2 tablet (0.5 mg total) by mouth daily at bedtime [provider] Taking Active   hydrALAZINE (APRESOLINE) 50 MG tablet [id number] Yes Take 2 tablets (100 mg total) by mouth 3 (three) times daily. Kroeger, Lorelee Cover., PA-C Taking Active   isosorbide mononitrate (IMDUR) 60 MG 24 hr tablet [id number] Yes Take 2 tablets (120 mg total) by mouth daily. Kroeger, Lorelee Cover., PA-C Taking Active   nitroGLYCERIN (NITROSTAT) 0.4 MG SL tablet [id number] Yes Place 0.4 mg under the tongue every 5 (five) minutes as needed for chest pain. [provider] Taking Active   rosuvastatin (CRESTOR) 40 MG tablet [id number] Yes Take 1 tablet (40 mg total) by mouth daily. Kroeger, Lorelee Cover., PA-C Taking Active   torsemide (DEMADEX) 20 MG tablet [id number] Yes Take 1 tablet (20 mg total) by mouth 2 (two) times a week. Mondays and Fridays Katherine Roan, MD Taking Active            Med Note Tori Milks Apr 04, 2021 11:28 AM)    TRUEplus Lancets 33G MISC [id number] Yes USE 4 (FOUR) TIMES DAILY Elayne Snare, MD Taking Active             Patient Active Problem List   Diagnosis Date Noted   Preoperative cardiovascular examination 04/04/2021   HFrEF (heart failure with reduced ejection fraction) ([place]) 04/03/2021   Bilateral hearing loss 01/04/2021   Bilateral impacted cerumen 01/04/2021   Tympanic membrane perforation, left 01/04/2021   Physical deconditioning 10/16/2020   Hereditary  cardiac amyloidosis ([place]) 09/30/2020   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   Progressive angina (HCC) - Atypical; DOE 09/20/2020    CAD (coronary artery disease) 09/20/2020   Acute on chronic combined systolic and diastolic CHF (congestive heart failure) ([place]) 09/20/2020   Acute combined systolic and diastolic heart failure ([place]) 09/20/2020   Soft tissue mass 07/30/2020   Contracture of joint of finger of left hand 07/30/2020   Acute respiratory disease due to COVID-19 virus 06/05/2019   DOE (dyspnea on exertion) 03/01/2019   Mild cognitive impairment 12/05/2018   Slurred speech 07/23/2018   Muscle twitching 07/23/2018   OSA (obstructive sleep apnea) 03/27/2018   Snores 01/21/2018   Poor balance 01/23/2017   Ankle swelling, left 09/06/2016   Bradycardia 01/28/2016   Weak urinary stream 12/31/2015   Stage 3b chronic kidney disease 09/15/2015   Ventral hernia without obstruction or gangrene 09/15/2015   Prostate cancer ([place]) 07/25/2015   Erectile dysfunction 05/04/2015   Urinary frequency 05/04/2015   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   Abnormal finding on MRI of brain 02/14/2015   PEA (Pulseless electrical activity) ([place]) 12/03/2012   Diabetes ([place]) 06/30/2011   Gout 03/05/2007   Essential hypertension 03/05/2007    Immunization History  Administered Date(s) Administered   Fluad Quad(high Dose 65+) 03/05/2019, 01/28/2020, 02/08/2021   Influenza Split 03/02/2011   Influenza Whole 01/23/2013   Influenza, High Dose Seasonal PF 01/28/2016, 01/23/2017, 01/21/2018   Influenza,inj,Quad PF,6+ Mos 02/23/2014, 02/24/2015   PFIZER(Purple Top)SARS-COV-2 Vaccination 07/08/2019, 07/29/2019, 02/07/2020   Pneumococcal Conjugate-13 05/04/2015   Pneumococcal Polysaccharide-23 10/02/2012, 01/21/2018   Tdap 12/15/2010, 09/21/2011    Conditions to be addressed/monitored:  Hypertension, Hyperlipidemia, Diabetes, Heart Failure, Coronary Artery Disease, Chronic Kidney Disease, and Gout  There are no care plans that you recently modified to display for this patient.    Medication Assistance:   Jardiance - BI cares approved through 05/14/21 (10 mg shipped early Aug, 25 mg shipped ~mid Sept)  Compliance/Adherence/Medication fill history: Care Gaps: Shingrix Eye exam (due 05/28/19) Covid booster (due 05/08/20)  Star-Rating Drugs: Metformin - LF 08/24/20 x 90 ds Rosuvastatin - LF 09/30/20 x 90 ds Jardiance - LF 09/30/20  Patient's preferred pharmacy is:  [facility] ([facility] [place] [address] [place] ([place] [address] Phone: [phone] Fax: [phone]  [place], [place] [address] Phone: [phone] Fax: [phone]  Uses pill box? Yes Pt endorses 100% compliance  We discussed: Current pharmacy is preferred with insurance plan and patient is satisfied with pharmacy services Patient decided to: Continue current medication management strategy  Care Plan and Follow Up Patient Decision:  Patient agrees to Care Plan and Follow-up.  Plan: Telephone follow up appointment with care management team member scheduled for:  *** months  ***  CP20 mins

## 2021-04-20 ENCOUNTER — Other Ambulatory Visit: Payer: Self-pay | Admitting: Nephrology

## 2021-04-20 ENCOUNTER — Encounter (HOSPITAL_COMMUNITY): Payer: Medicare HMO

## 2021-04-20 DIAGNOSIS — N1832 Chronic kidney disease, stage 3b: Secondary | ICD-10-CM

## 2021-04-26 ENCOUNTER — Other Ambulatory Visit: Payer: Self-pay | Admitting: Endocrinology

## 2021-04-27 ENCOUNTER — Ambulatory Visit: Payer: Medicare HMO

## 2021-04-27 ENCOUNTER — Ambulatory Visit (INDEPENDENT_AMBULATORY_CARE_PROVIDER_SITE_OTHER): Payer: Medicare HMO | Admitting: Podiatry

## 2021-04-27 ENCOUNTER — Other Ambulatory Visit: Payer: Self-pay

## 2021-04-27 ENCOUNTER — Encounter: Payer: Self-pay | Admitting: Podiatry

## 2021-04-27 DIAGNOSIS — M2041 Other hammer toe(s) (acquired), right foot: Secondary | ICD-10-CM | POA: Diagnosis not present

## 2021-04-27 DIAGNOSIS — B351 Tinea unguium: Secondary | ICD-10-CM

## 2021-04-27 DIAGNOSIS — N1832 Chronic kidney disease, stage 3b: Secondary | ICD-10-CM | POA: Diagnosis not present

## 2021-04-27 DIAGNOSIS — M79674 Pain in right toe(s): Secondary | ICD-10-CM

## 2021-04-27 DIAGNOSIS — M79675 Pain in left toe(s): Secondary | ICD-10-CM

## 2021-04-27 DIAGNOSIS — E0822 Diabetes mellitus due to underlying condition with diabetic chronic kidney disease: Secondary | ICD-10-CM | POA: Diagnosis not present

## 2021-04-27 DIAGNOSIS — L84 Corns and callosities: Secondary | ICD-10-CM

## 2021-04-27 DIAGNOSIS — M2042 Other hammer toe(s) (acquired), left foot: Secondary | ICD-10-CM | POA: Diagnosis not present

## 2021-04-27 DIAGNOSIS — E119 Type 2 diabetes mellitus without complications: Secondary | ICD-10-CM | POA: Diagnosis not present

## 2021-04-27 NOTE — Progress Notes (Signed)
SITUATION Reason for Visit: Fitting of Diabetic Shoes & Insoles Patient / Caregiver Report:  Patient is satisfied with shoes and insoles  OBJECTIVE DATA: Patient History / Diagnosis:  Diabetes mellitus due to underlying condition with stage 3b chronic kidney disease, without long-term current use of insulin ([place])  Change in Status:   None  ACTIONS PERFORMED: In-Person Delivery, patient was fit with: - 1x pair A5500 PDAC approved prefabricated Diabetic Shoes: [id number] Men's 11XW - 3x pair [id number] PDAC approved CAM milled custom diabetic insoles  Shoes and insoles were verified for structural integrity and safety. Patient wore shoes and insoles in office. Skin was inspected and free of areas of concern after wearing shoes and inserts. Shoes and inserts fit properly. Patient / Caregiver provided with ferbal instruction and demonstration regarding donning, doffing, wear, care, proper fit, function, purpose, cleaning, and use of shoes and insoles ' and in all related precautions and risks and benefits regarding shoes and insoles. Patient / Caregiver was instructed to wear properly fitting socks with shoes at all times. Patient was also provided with verbal instruction regarding how to report any failures or malfunctions of shoes or inserts, and necessary follow up care. Patient / Caregiver was also instructed to contact physician regarding change in status that may affect function of shoes and inserts.   Patient / Caregiver verbalized undersatnding of instruction provided. Patient / Caregiver demonstrated independence with proper donning and doffing of shoes and inserts.  PLAN Patient to follow up as needed. Plan of care was discussed with and agreed upon by patient and/or caregiver. All questions were answered and concerns addressed.

## 2021-04-28 ENCOUNTER — Other Ambulatory Visit: Payer: Self-pay

## 2021-04-28 ENCOUNTER — Other Ambulatory Visit: Payer: Self-pay | Admitting: Pharmacist Clinician (PhC)/ Clinical Pharmacy Specialist

## 2021-04-28 ENCOUNTER — Ambulatory Visit (INDEPENDENT_AMBULATORY_CARE_PROVIDER_SITE_OTHER): Payer: Medicare HMO

## 2021-04-28 ENCOUNTER — Telehealth: Payer: Self-pay | Admitting: Cardiovascular Disease

## 2021-04-28 DIAGNOSIS — Z794 Long term (current) use of insulin: Secondary | ICD-10-CM

## 2021-04-28 DIAGNOSIS — I5040 Unspecified combined systolic (congestive) and diastolic (congestive) heart failure: Secondary | ICD-10-CM

## 2021-04-28 DIAGNOSIS — I10 Essential (primary) hypertension: Secondary | ICD-10-CM

## 2021-04-28 MED ORDER — VYNDAMAX 61 MG PO CAPS: 61.0000 mg | ORAL_CAPSULE | Freq: Every day | ORAL | 12 refills | Status: DC

## 2021-04-28 NOTE — Progress Notes (Signed)
Chronic Care Management Pharmacy Note  04/28/2021 Name:  Corey [name] MRN:  [id number] DOB:  08-22-1945  Summary: -Patient confirms that he has been taking jardiance 29m daily - also continues with glimepiride 134m- 1/2 tablet daily - BG well controlled for the most part - BG averaging in the 100-110 range  -Blood pressure on current regimen has been well controlled at home, patient reports blood pressures averaging ~108-120/70's - patient unaware of HR as he has not been checking  -Reports with last cardiology visit he was instructed to start Vyndamax (tafamidis) [id number] -per patient cardiology office was completing patient assistance application - has not received medication as of this time  -Reports to using torsemide TIW - weight has been stable averaging 157-160lbs  -Denies any recent gout flares, and no issues with chest pains recently  -Reports that he was seen by nephrologist last month - no changes to medications   Recommendations/Changes made from today's visit: -Patient to continue monitoring BG and BP daily, to reach out should either be elevated from goal / if he is experiencing symptoms of hypotension / hypoglycemia  -Patient to continue to monitor weight daily and reach out should he gain >3lbs in a day or >5lbs in a week -Patient to reach out to cardiology office for status update on Vyndamax to be able to begin  Subjective: Corey [name] an 75 41o. year old male who is a primary patient of Burns, StaClaudina LickD.  The CCM team was consulted for assistance with disease management and care coordination needs.    Engaged with patient by telephone for follow up visit in response to provider referral for pharmacy case management and/or care coordination services.   Consent to Services:  The patient was given information about Chronic Care Management services, agreed to services, and gave verbal consent prior to initiation of services.  Please see initial visit note for  detailed documentation.   Patient Care Team: BurBinnie RailD as PCP - General (Internal Medicine) SkaJerline PainD as PCP - Cardiology (Cardiology) [facility]Charlton [facility]PH[facility] Pharmacist (Pharmacist) TouKnox RoyaltyN as Case Manager  Recent office visits: 02/09/21 pt message - PCP referred to nephrologist.  10/15/20 Dr BurQuay Burow: hospital f/u; referred to home health  Recent consult visits: 04/04/2021 - ScoRichardson Dopp-C - cardiology - no changes to medications - follow up with Dr. SkaMarlou Porch 3 months   Hospital visits: -None since last visit    Objective:  Lab Results  Component Value Date   CREATININE 2.07 (H) 02/07/2021   BUN 37 (H) 02/07/2021   GFR 31.34 (L) 02/07/2021   GFRNONAA 30 (L) 10/27/2020   GFRAA 46 (L) 01/28/2020   NA 142 02/07/2021   K 4.3 02/07/2021   CALCIUM 9.8 02/07/2021   CO2 24 02/07/2021   GLUCOSE 104 (H) 02/07/2021    Lab Results  Component Value Date/Time   HGBA1C 6.8 (H) 02/07/2021 09:23 AM   HGBA1C 7.2 (H) 11/23/2020 10:56 AM   FRUCTOSAMINE 338 (H) 01/03/2021 08:41 AM   FRUCTOSAMINE 287 (H) 03/01/2020 09:09 AM   GFR 31.34 (L) 02/07/2021 09:23 AM   GFR 33.28 (L) 01/03/2021 08:41 AM   MICROALBUR 8.5 (H) 11/23/2020 10:56 AM   MICROALBUR 2.4 (H) 03/03/2020 11:09 AM    Last diabetic Eye exam:  Lab Results  Component Value Date/Time   HMDIABEYEEXA No Retinopathy 05/27/2018 12:00 AM    Last diabetic Foot exam: No results found  for: HMDIABFOOTEX   Lab Results  Component Value Date   CHOL 88 11/23/2020   HDL 41.90 11/23/2020   LDLCALC 34 11/23/2020   TRIG 64.0 11/23/2020   CHOLHDL 2 11/23/2020    Hepatic Function Latest Ref Rng & Units 02/07/2021 11/23/2020 09/23/2020  Total Protein 6.0 - 8.3 g/dL 6.5 7.1 6.2(L)  Albumin 3.5 - 5.2 g/dL 3.7 4.1 3.3(L)  AST 0 - 37 U/L 45(H) 35 25  ALT 0 - 53 U/L 51 20 16  Alk Phosphatase 39 - 117 U/L 90 64 67  Total Bilirubin 0.2 - 1.2 mg/dL 0.7 0.6 1.3(H)  Bilirubin, Direct 0.0 - 0.3 mg/dL - - -     Lab Results  Component Value Date/Time   TSH 1.520 09/27/2020 08:46 AM   TSH 0.243 (L) 06/06/2019 03:43 AM   TSH 1.13 01/23/2017 11:48 AM   TSH 1.06 12/03/2012 11:43 AM   FREET4 1.02 09/27/2020 08:46 AM   FREET4 0.82 06/06/2019 03:43 AM    CBC Latest Ref Rng & Units 09/29/2020 09/28/2020 09/27/2020  WBC 4.0 - 10.5 K/uL 8.2 9.8 5.1  Hemoglobin 13.0 - 17.0 g/dL 13.6 13.4 14.5  Hematocrit 39.0 - 52.0 % 44.0 42.4 45.2  Platelets 150 - 400 K/uL 218 223 196    No results found for: VD25OH  Clinical ASCVD: Yes  The ASCVD Risk score (Arnett DK, et al., 2019) failed to calculate for the following reasons:   The patient has a prior MI or stroke diagnosis    Depression screen [facility] 2/9 11/04/2020 07/30/2020 06/12/2019  Decreased Interest 0 0 0  Down, Depressed, Hopeless 0 0 0  PHQ - 2 Score 0 0 0  Some recent data might be hidden    Social History   Tobacco Use  Smoking Status Former   Packs/day: 0.25   Years: 1.00   Pack years: 0.25   Types: Cigarettes   Quit date: 05/15/1968   Years since quitting: 52.9  Smokeless Tobacco Never  Tobacco Comments   smoked [place], up to 1-2 cigarettes/ day   BP Readings from Last 3 Encounters:  04/04/21 (!) 150/80  03/10/21 (!) 154/78  02/08/21 (!) 142/78   Pulse Readings from Last 3 Encounters:  04/04/21 72  03/10/21 (!) 55  02/08/21 (!) 53   Wt Readings from Last 3 Encounters:  04/04/21 167 lb 9.6 oz (76 kg)  03/10/21 159 lb 9.6 oz (72.4 kg)  02/08/21 165 lb (74.8 kg)   BMI Readings from Last 3 Encounters:  04/04/21 27.05 kg/m  03/10/21 25.76 kg/m  02/08/21 26.63 kg/m    Assessment/Interventions: Review of patient past medical history, allergies, medications, health status, including review of consultants reports, laboratory and other test data, was performed as part of comprehensive evaluation and provision of chronic care management services.   SDOH:  (Social Determinants of Health) assessments and interventions  performed: Yes  SDOH Screenings   Alcohol Screen: Not on file  Depression (PHQ2-9): Low Risk    PHQ-2 Score: 0  Financial Resource Strain: Medium Risk   Difficulty of Paying Living Expenses: Somewhat hard  Food Insecurity: No Food Insecurity   Worried About Charity fundraiser in the Last Year: Never true   Ran Out of Food in the Last Year: Never true  Housing: Low Risk    Last Housing Risk Score: 0  Physical Activity: Not on file  Social Connections: Not on file  Stress: Not on file  Tobacco Use: Medium Risk   Smoking Tobacco Use:  Former   Smokeless Tobacco Use: Never   Passive Exposure: Not on file  Transportation Needs: No Transportation Needs   Lack of Transportation (Medical): No   Lack of Transportation (Non-Medical): No    CCM Care Plan  Allergies  Allergen Reactions   Hydrochlorothiazide Other (See Comments)    Gout , uncontrolled diabetes and renal insufficiency    Medications Reviewed Today     Reviewed by Marzetta Board, DPM (Physician) on 04/27/21 at 1049  Med List Status: <None>   Medication Order Taking? Sig Documenting Provider Last Dose Status Informant  ACCU-CHEK AVIVA PLUS test strip [id number] No TEST BLOOD SUGAR TWICE DAILY AS DIRECTED Elayne Snare, MD Taking Active   ACCU-CHEK AVIVA PLUS test strip [id number] No Use as instructed Elayne Snare, MD Taking Active   albuterol (VENTOLIN HFA) 108 (90 Base) MCG/ACT inhaler [id number] No Inhale 2 puffs into the lungs every 4 (four) hours as needed for wheezing or shortness of breath. Janith Lima, MD Taking Active Self  Alcohol Swabs (DROPSAFE ALCOHOL PREP) 70 % PADS [id number] No USE 4 (FOUR) TIMES DAILY Elayne Snare, MD Taking Active   allopurinol (ZYLOPRIM) 100 MG tablet [id number] No Take 1/2 (one-half) tablet by mouth once daily [name], MD Taking Active   aspirin EC 81 MG tablet [id number] No Take 1 tablet (81 mg total) by mouth daily. [name], MD Taking Active   bismuth subsalicylate  (PEPTO BISMOL) 262 MG/15ML suspension [id number] No Take 30 mLs by mouth every 6 (six) hours as needed for indigestion. [provider] Taking Active Self  carvedilol (COREG) 6.25 MG tablet [id number] No Take 1 tablet (6.25 mg total) by mouth 2 (two) times daily with a meal. Burns, [name], MD Taking Active   colchicine (COLCRYS) 0.6 MG tablet [id number] No Take 2 tabs once and then one tab one hour later as needed for gout Quay Burow, [name], MD Taking Active   empagliflozin (JARDIANCE) 25 MG TABS tablet [id number] No Take 1 tablet (25 mg total) by mouth daily before breakfast. Jerline Pain, MD Taking Active            Med Note Briant Cedar   Mon Apr 04, 2021 11:28 AM)    glimepiride (AMARYL) 1 MG tablet [id number] No Take 1/2 tablet (0.5 mg total) by mouth daily at bedtime [provider] Taking Active   hydrALAZINE (APRESOLINE) 50 MG tablet [id number] No Take 2 tablets (100 mg total) by mouth 3 (three) times daily. Kroeger, Lorelee Cover., PA-C Taking Active   isosorbide mononitrate (IMDUR) 60 MG 24 hr tablet [id number] No Take 2 tablets (120 mg total) by mouth daily. Kroeger, Lorelee Cover., PA-C Taking Active   nitroGLYCERIN (NITROSTAT) 0.4 MG SL tablet [id number] No Place 0.4 mg under the tongue every 5 (five) minutes as needed for chest pain. [provider] Taking Active   rosuvastatin (CRESTOR) 40 MG tablet [id number] No Take 1 tablet (40 mg total) by mouth daily. Kroeger, Lorelee Cover., PA-C Taking Active   torsemide (DEMADEX) 20 MG tablet [id number] No Take 1 tablet (20 mg total) by mouth 2 (two) times a week. Mondays and Fridays  Patient taking differently: Take 20 mg by mouth 3 (three) times a week. Mondays, Wednesdays, and Fridays   Katherine Roan, MD Taking Active            Med Note Tori Milks Apr 04, 2021 11:28 AM)    TRUEplus Lancets 33G MISC [id number] No USE 4 (  FOUR) TIMES DAILY Elayne Snare, MD Taking Active             Patient Active Problem List    Diagnosis Date Noted   Preoperative cardiovascular examination 04/04/2021   HFrEF (heart failure with reduced ejection fraction) ([place]) 04/03/2021   Bilateral hearing loss 01/04/2021   Bilateral impacted cerumen 01/04/2021   Tympanic membrane perforation, left 01/04/2021   Physical deconditioning 10/16/2020   Hereditary cardiac amyloidosis ([place]) 09/30/2020   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   Progressive angina ([place]) - Atypical; DOE 09/20/2020   CAD (coronary artery disease) 09/20/2020   Acute on chronic combined systolic and diastolic CHF (congestive heart failure) ([place]) 09/20/2020   Acute combined systolic and diastolic heart failure ([place]) 09/20/2020   Soft tissue mass 07/30/2020   Contracture of joint of finger of left hand 07/30/2020   Acute respiratory disease due to COVID-19 virus 06/05/2019   DOE (dyspnea on exertion) 03/01/2019   Mild cognitive impairment 12/05/2018   Slurred speech 07/23/2018   Muscle twitching 07/23/2018   OSA (obstructive sleep apnea) 03/27/2018   Snores 01/21/2018   Poor balance 01/23/2017   Ankle swelling, left 09/06/2016   Bradycardia 01/28/2016   Weak urinary stream 12/31/2015   Stage 3b chronic kidney disease 09/15/2015   Ventral hernia without obstruction or gangrene 09/15/2015   Prostate cancer ([place]) 07/25/2015   Erectile dysfunction 05/04/2015   Urinary frequency 05/04/2015   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   Abnormal finding on MRI of brain 02/14/2015   PEA (Pulseless electrical activity) ([place]) 12/03/2012   Diabetes ([place]) 06/30/2011   Gout 03/05/2007   Essential hypertension 03/05/2007    Immunization History  Administered Date(s) Administered   Fluad Quad(high Dose 65+) 03/05/2019, 01/28/2020, 02/08/2021   Influenza Split 03/02/2011   Influenza Whole 01/23/2013   Influenza, High Dose Seasonal PF 01/28/2016, 01/23/2017, 01/21/2018   Influenza,inj,Quad PF,6+ Mos 02/23/2014, 02/24/2015    PFIZER(Purple Top)SARS-COV-2 Vaccination 07/08/2019, 07/29/2019, 02/07/2020   Pneumococcal Conjugate-13 05/04/2015   Pneumococcal Polysaccharide-23 10/02/2012, 01/21/2018   Tdap 12/15/2010, 09/21/2011    Conditions to be addressed/monitored:  Hypertension, Hyperlipidemia, Diabetes, Heart Failure, Coronary Artery Disease, Chronic Kidney Disease, and Gout  There are no care plans that you recently modified to display for this patient.    Medication Assistance:  Jardiance - BI cares approved through 05/14/21 (10 mg shipped early Aug, 25 mg shipped ~mid Sept)  Compliance/Adherence/Medication fill history: Care Gaps: Shingrix Eye exam (due 05/28/19) Covid booster (due 05/08/20)  Star-Rating Drugs: Metformin - LF 08/24/20 x 90 ds Rosuvastatin - LF 09/30/20 x 90 ds Jardiance - LF 09/30/20  Patient's preferred pharmacy is:  [facility] [place] [address] [place] [address] Phone: [phone] Fax: [phone]  [place], [place] [address] Phone: [phone] Fax: [phone]  Uses pill box? Yes Pt endorses 100% compliance  We discussed: Current pharmacy is preferred with insurance plan and patient is satisfied with pharmacy services Patient decided to: Continue current medication management strategy  Care Plan and Follow Up Patient Decision:  Patient agrees to Care Plan and Follow-up.  Plan: Telephone follow up appointment with care management team member scheduled for:  2 months  Tomasa Blase, PharmD Clinical Pharmacist, [place]

## 2021-04-28 NOTE — Patient Instructions (Signed)
Visit Information  Following are the goals we discussed today:   Manage My Medications   Timeframe:  Long-Range Goal Priority:  High Start Date:      11/16/20                       Expected End Date: 02/17/2022                     Follow Up Date Feb 2023   - call for medicine refill 2 or 3 days before it runs out - call if I am sick and can't take my medicine - keep a list of all the medicines I take; vitamins and herbals too - use a pillbox to sort medicine -check blood pressure daily -weigh daily, take an extra torsemide dose if weight increase 2+ lbs overnight or 5+ lbs in a week -engage in dietary modifications by reducing salt    Why is this important?   These steps will help you keep on track with your medicines.  Plan: Telephone follow up appointment with care management team member scheduled for:  2 months The patient has been provided with contact information for the care management team and has been advised to call with any health related questions or concerns.   Tomasa Blase, PharmD Clinical Pharmacist, [facility]    Please call the care guide team at [phone] if you need to cancel or reschedule your appointment.   Patient verbalizes understanding of instructions provided today and agrees to view in [place].

## 2021-04-28 NOTE — Telephone Encounter (Signed)
Prescription was re-sent to [facility] for 30 day supply.  Will check with Vyndalink again tomorrow.

## 2021-04-28 NOTE — Telephone Encounter (Signed)
Called patient wife, advised I had not heard anything in regards to the medication but I would check in with our pharmacy team who was assisting. I advised I would call her back with any updates.   Thanks!

## 2021-04-28 NOTE — Telephone Encounter (Signed)
Patient's wife called stating Dr. Audie Box was going to put her husband on a medication she doesn't remember the name.  She states she filled out forms for the patient assistance program for it and she dropped it for at the office.  She wants to know the status of it as he hasn't received the medication yet.

## 2021-05-01 NOTE — Progress Notes (Signed)
°  Subjective:  Patient ID: Corey [name], male    DOB: Aug 08, 1945,  MRN: [id number]  ENMANUEL ZUFALL presents to clinic today for at risk foot care. Pt has h/o NIDDM with chronic kidney disease, painful elongated mycotic toenails 1-5 bilaterally which are tender when wearing enclosed shoe gear. Pain is relieved with periodic professional debridement., and preulcerative lesion(s) L 2nd toe.  Patient states blood glucose was 106 mg/dl today.    Mr. Fischel states his left 2nd toe feels better.  He is accompanied by his wife on today's visit. Mrs. Pembleton states she was able to get diabetic shoe certification form completed and Mr. Bergfeld is scheduled to be measured for his shoes on today.  PCP is [name], MD , and last visit was 03/14/2021.  Allergies  Allergen Reactions   Hydrochlorothiazide Other (See Comments)    Gout , uncontrolled diabetes and renal insufficiency    Review of Systems: Negative except as noted in the HPI. Objective:   Constitutional GARV KUECHLE is a pleasant 75 y.o. African American male, WD, WN in NAD. AAO x 3.   Vascular CFT <3 seconds b/l LE. Palpable DP pulse(s) b/l LE. Palpable PT pulse(s) b/l LE. Pedal hair sparse. No pain with calf compression b/l. No edema noted b/l LE. No cyanosis or clubbing noted b/l LE.  Neurologic Normal speech. Oriented to person, place, and time. Protective sensation decreased with 10 gram monofilament b/l. Vibratory sensation decreased b/l.  Dermatologic Pedal skin is warm and supple b/l LE. No open wounds b/l LE. No interdigital macerations noted b/l LE. Toenails 1-5 b/l well maintained with adequate length. No erythema, no edema, no drainage, no fluctuance. Preulcerative lesion noted L 2nd toe. There is visible subdermal hemorrhage. There is no surrounding erythema, no edema, no drainage, no odor, no fluctuance.  Orthopedic: Muscle strength 5/5 to all lower extremity muscle groups bilaterally. Semi-rigid hammertoe deformity b/l 2nd  digits. HAV with bunion deformity noted b/l LE.   Radiographs: None  Last A1c:  Hemoglobin A1C Latest Ref Rng & Units 02/07/2021 11/23/2020 09/21/2020 09/06/2020 06/02/2020  HGBA1C 4.6 - 6.5 % 6.8(H) 7.2(H) 6.4(H) 6.4 6.3  Some recent data might be hidden   Assessment:   1. Pain due to onychomycosis of toenails of both feet   2. Pre-ulcerative corn or callous   3. Diabetes mellitus due to underlying condition with stage 3b chronic kidney disease, without long-term current use of insulin ([place])    Plan:  Patient was evaluated and treated and all questions answered. Consent given for treatment as described below: -Continue foot and shoe inspections daily. Monitor blood glucose per PCP/Endocrinologist's recommendations. -Mycotic toenails 1-5 bilaterally were debrided in length and girth with sterile nail nippers and dremel without iatrogenic bleeding. -Mr. Ottaviano is scheduled to see Pedorthist this afternoon for his diabetic shoes/insoles. -Preulcerative lesion pared L 2nd toe. Total number pared=1. -Patient/POA to call should there be question/concern in the interim.  Return in about 3 months (around 07/26/2021).  Marzetta Board, DPM

## 2021-05-05 ENCOUNTER — Ambulatory Visit: Admit: 2021-05-05 | Payer: Medicare HMO | Admitting: Surgery

## 2021-05-05 NOTE — Telephone Encounter (Signed)
LM with Vyndalink contact regarding prescription

## 2021-05-06 ENCOUNTER — Other Ambulatory Visit: Payer: Self-pay

## 2021-05-06 ENCOUNTER — Other Ambulatory Visit (INDEPENDENT_AMBULATORY_CARE_PROVIDER_SITE_OTHER): Payer: Medicare HMO

## 2021-05-06 DIAGNOSIS — E1165 Type 2 diabetes mellitus with hyperglycemia: Secondary | ICD-10-CM

## 2021-05-06 LAB — BASIC METABOLIC PANEL
BUN: 42 mg/dL — ABNORMAL HIGH (ref 6–23)
CO2: 30 mEq/L (ref 19–32)
Calcium: 9.7 mg/dL (ref 8.4–10.5)
Chloride: 108 mEq/L (ref 96–112)
Creatinine, Ser: 2.51 mg/dL — ABNORMAL HIGH (ref 0.40–1.50)
GFR: 24.83 mL/min — ABNORMAL LOW (ref >60.00)
Glucose, Bld: 83 mg/dL (ref 70–99)
Potassium: 4.4 mEq/L (ref 3.5–5.1)
Sodium: 143 mEq/L (ref 135–145)

## 2021-05-06 LAB — HEMOGLOBIN A1C: Hgb A1c MFr Bld: 7.2 % — ABNORMAL HIGH (ref 4.6–6.5)

## 2021-05-10 ENCOUNTER — Other Ambulatory Visit: Payer: Self-pay

## 2021-05-10 ENCOUNTER — Encounter: Payer: Self-pay | Admitting: Endocrinology

## 2021-05-10 ENCOUNTER — Ambulatory Visit: Payer: Medicare HMO | Admitting: Endocrinology

## 2021-05-10 VITALS — BP 140/62 | HR 53 | Ht 66.0 in | Wt 162.8 lb

## 2021-05-10 DIAGNOSIS — I10 Essential (primary) hypertension: Secondary | ICD-10-CM

## 2021-05-10 DIAGNOSIS — E1165 Type 2 diabetes mellitus with hyperglycemia: Secondary | ICD-10-CM

## 2021-05-10 DIAGNOSIS — N1832 Chronic kidney disease, stage 3b: Secondary | ICD-10-CM

## 2021-05-10 MED ORDER — GLIPIZIDE ER 2.5 MG PO TB24: 2.5000 mg | ORAL_TABLET | Freq: Every day | ORAL | 1 refills | Status: DC

## 2021-05-10 NOTE — Progress Notes (Signed)
Patient ID: Corey [name], male   DOB: 01-11-1946, 75 y.o.   MRN: [id number]   Reason for Appointment : Followup of various problems  History of Present Illness          Diagnosis: Type 2 diabetes mellitus, date of diagnosis: 1997      Past history: The patient was initially treated with various oral hypoglycemic drugs and details are not available. About 2009 he was started on insulin and has either taken premixed insulin or Lantus once a day He had difficulty affording his Lantus insulin and had preferred to take premixed insulin Subsequently insulin was tapered off  Recent history:    Oral hypoglycemic drugs:   Jardiance 25 mg daily, Amaryl 1 mg daily  Current blood sugar patterns and problems:  His A1c which was 6.8 has gone up to 7.2  Previous fructosamine was 338  His blood sugars are generally lower than expected from his A1c and he does not think his sugars have been higher at any point in the last 3 months  He has done some sporadic readings at all times including at night but most of the evening readings are before dinner  He has only 1 low reading about 3 weeks ago which was 53 otherwise no hypoglycemia  Lab glucose 83  He has been continued on 25 mg Jardiance  He now says that he walks about couple of miles a day most days when he can get out although this may take him about 1 hour Blood sugars were reviewed from monitor download Recently his weight has fluctuated   Glucose monitoring:  done less than 1 time a day         Glucometer:  Accu-Chek       Blood Glucose readings from download   PRE-MEAL Fasting Lunch Dinner Bedtime Overall  Glucose range: 53-126    53-148  Mean/median: 100 91  97 101/97   Prior readings  PRE-MEAL Fasting Lunch Dinner Bedtime Overall  Glucose range:     86-185  Mean/median: 118       POST-MEAL PC Breakfast PC Lunch PC Dinner  Glucose range:   87-185  Mean/median:   113      Self-care:       Meals: 2-3 meals per  day. Egg in am at 9-10 am or grits, rarely sandwich at lunch. His largest meal is at suppertime at about 8 pm.         Dietician visit: Most recent: 02/2014               Compliance with the medical regimen:  Fair.    Wt Readings from Last 3 Encounters:  05/10/21 162 lb 12.8 oz (73.8 kg)  04/04/21 167 lb 9.6 oz (76 kg)  03/10/21 159 lb 9.6 oz (72.4 kg)    Lab Results  Component Value Date   HGBA1C 7.2 (H) 05/06/2021   HGBA1C 6.8 (H) 02/07/2021   HGBA1C 7.2 (H) 11/23/2020    Lab Results  Component Value Date   MICROALBUR 8.5 (H) 11/23/2020   LDLCALC 34 11/23/2020   CREATININE 2.51 (H) 05/06/2021   Lab Results  Component Value Date   FRUCTOSAMINE 338 (H) 01/03/2021   FRUCTOSAMINE 287 (H) 03/01/2020   FRUCTOSAMINE 320 (H) 11/26/2019    Lab on 05/06/2021  Component Date Value Ref Range Status   Sodium 05/06/2021 143  135 - 145 mEq/L Final   Potassium 05/06/2021 4.4  3.5 - 5.1 mEq/L Final  Chloride 05/06/2021 108  96 - 112 mEq/L Final   CO2 05/06/2021 30  19 - 32 mEq/L Final   Glucose, Bld 05/06/2021 83  70 - 99 mg/dL Final   BUN 05/06/2021 42 (H)  6 - 23 mg/dL Final   Creatinine, Ser 05/06/2021 2.51 (H)  0.40 - 1.50 mg/dL Final   GFR 05/06/2021 24.83 (L)  >60.00 mL/min Final   Calculated using the CKD-EPI Creatinine Equation (2021)   Calcium 05/06/2021 9.7  8.4 - 10.5 mg/dL Final   Hgb A1c MFr Bld 05/06/2021 7.2 (H)  4.6 - 6.5 % Final   Glycemic Control Guidelines for People with Diabetes:Non Diabetic:  <6%Goal of Therapy: <7%Additional Action Suggested:  >8%    Other active medical problems addressed: See review of systems   Allergies as of 05/10/2021       Reactions   Hydrochlorothiazide Other (See Comments)   Gout , uncontrolled diabetes and renal insufficiency        Medication List        Accurate as of May 10, 2021 10:08 AM. If you have any questions, ask your nurse or doctor.          Accu-Chek Aviva Plus test strip Generic drug: glucose  blood Use as instructed   Accu-Chek Aviva Plus test strip Generic drug: glucose blood TEST BLOOD SUGAR TWICE DAILY AS DIRECTED   albuterol 108 (90 Base) MCG/ACT inhaler Commonly known as: VENTOLIN HFA Inhale 2 puffs into the lungs every 4 (four) hours as needed for wheezing or shortness of breath.   allopurinol 100 MG tablet Commonly known as: ZYLOPRIM Take 1/2 (one-half) tablet by mouth once daily   aspirin EC 81 MG tablet Take 1 tablet (81 mg total) by mouth daily.   bismuth subsalicylate 235 TI/14ER suspension Commonly known as: PEPTO BISMOL Take 30 mLs by mouth every 6 (six) hours as needed for indigestion.   carvedilol 6.25 MG tablet Commonly known as: COREG Take 1 tablet (6.25 mg total) by mouth 2 (two) times daily with a meal.   colchicine 0.6 MG tablet Commonly known as: Colcrys Take 2 tabs once and then one tab one hour later as needed for gout   DropSafe Alcohol Prep 70 % Pads USE 4 (FOUR) TIMES DAILY   empagliflozin 25 MG Tabs tablet Commonly known as: JARDIANCE Take 1 tablet (25 mg total) by mouth daily before breakfast.   glimepiride 1 MG tablet Commonly known as: AMARYL Take 1/2 tablet (0.5 mg total) by mouth daily at bedtime   hydrALAZINE 50 MG tablet Commonly known as: APRESOLINE Take 2 tablets (100 mg total) by mouth 3 (three) times daily.   isosorbide mononitrate 60 MG 24 hr tablet Commonly known as: IMDUR Take 2 tablets (120 mg total) by mouth daily.   nitroGLYCERIN 0.4 MG SL tablet Commonly known as: NITROSTAT Place 0.4 mg under the tongue every 5 (five) minutes as needed for chest pain.   rosuvastatin 40 MG tablet Commonly known as: CRESTOR Take 1 tablet (40 mg total) by mouth daily.   torsemide 20 MG tablet Commonly known as: DEMADEX Take 1 tablet (20 mg total) by mouth 2 (two) times a week. Mondays and Fridays What changed:  when to take this additional instructions   TRUEplus Lancets 33G Misc USE 4 (FOUR) TIMES DAILY    Vyndamax 61 MG Caps Generic drug: Tafamidis Take 61 mg by mouth daily.        Allergies:  Allergies  Allergen Reactions   Hydrochlorothiazide Other (See  Comments)    Gout , uncontrolled diabetes and renal insufficiency    Past Medical History:  Diagnosis Date   Cataract    CHF (congestive heart failure) (HCC)    Chronic heart failure with preserved ejection fraction (HFpEF) ([place])    Colon polyp 2003   Dr [name]; F/U was to be 2008( not completed)   CVA (cerebral infarction) 2014   Diabetes mellitus    Diverticulosis 2003   Dyspnea    with exertion   Gout    Hypertension    Myocardial infarction ([place]) 2014   ?? PEA cardiac arrest   OSA (obstructive sleep apnea) 03/27/2018   Pneumonia 09/29/2011   Avelox X 10 days as OP   Prostate cancer ([place])    Renal insufficiency    Seizures ([place])    not treated for seizure disorder; had a seizure after stroke 2014; no seizure since then    Past Surgical History:  Procedure Laterality Date   COLONOSCOPY W/ POLYPECTOMY  2003   no F/U ([place] discussed 12/03/12)   INSERTION OF MESH N/A 07/16/2017   Procedure: INSERTION OF MESH;  Surgeon: [name], MD;  Location: [facility];  Service: General;  Laterality: N/A;   PEG PLACEMENT  2014   PEG TUBE REMOVAL  2014   PROSTATE BIOPSY     RIGHT/LEFT HEART CATH AND CORONARY ANGIOGRAPHY N/A 09/20/2020   Procedure: RIGHT/LEFT HEART CATH AND CORONARY ANGIOGRAPHY;  Surgeon: Leonie Man, MD;  Location: [facility];  Service: Cardiovascular;  Laterality: N/A;   TEE WITHOUT CARDIOVERSION N/A 09/27/2020   Procedure: TRANSESOPHAGEAL ECHOCARDIOGRAM (TEE);  Surgeon: Wonda Olds, MD;  [facility];  Service: Thoracic;  Laterality: N/A;   TRACHEOSTOMY  2014   was closed after hospital stay   [place] N/A 07/16/2017   Procedure: [place];  Surgeon: [name], MD;  Location: [facility] OR;  Service: General;  Laterality: N/A;   wrist  aspiration  02/16/12    monosodium urate crystals; Dr Caralyn Guile    Family History  Problem Relation Age of Onset   Heart failure Mother    Hypertension Mother    Diabetes Mother    Hypertension Father    Prostate cancer Father    Diabetes Father    Cancer Father    Heart failure Brother    Stroke Neg Hx    Heart disease Neg Hx    Colon cancer Neg Hx     Social History:  reports that he quit smoking about 53 years ago. His smoking use included cigarettes. He has a 0.25 pack-year smoking history. He has never used smokeless tobacco. He reports that he does not drink alcohol and does not use drugs.    Review of Systems   Lipids: Has normal lipids, not on a statin drug  Lab Results  Component Value Date   CHOL 88 11/23/2020   HDL 41.90 11/23/2020   LDLCALC 34 11/23/2020   TRIG 64.0 11/23/2020   CHOLHDL 2 11/23/2020    Eye exams: Last done in 1/20 without retinopathy     He has renal insufficiency of unclear etiology with variable creatinine levels as follows: He has seen the nephrologist but no records are available  Lab Results  Component Value Date   CREATININE 2.51 (H) 05/06/2021   CREATININE 2.07 (H) 02/07/2021   CREATININE 1.97 (H) 01/03/2021     HYPERTENSION: on losartan 50 mg, amlodipine , clonidine 0.3 mg and  hydralazine as well as metoprolol  Currently managed by PCP and cardiologist  Monitors blood pressure at home periodically  He takes [facility] 3/7 for edema   BP Readings from Last 3 Encounters:  05/10/21 140/62  04/04/21 (!) 150/80  03/10/21 (!) 154/78    Foot exam done in 9/22  Although he does not appear to have any sensory loss he has significant deformities and podiatrist has requested diabetic shoes    Physical Examination:  BP 140/62    Pulse (!) 53    Ht 5\' 6"  (1.676 m)    Wt 162 lb 12.8 oz (73.8 kg)    SpO2 95%    BMI 26.28 kg/m       ASSESSMENT/PLAN:  Diabetes type 2, uncontrolled   See history of present illness for  discussion of current diabetes management, blood sugar patterns and problems identified  A1c is 7.2 compared to 6.8    He is on Jardiance 25 mg daily and Amaryl 1 mg  Although he has slight increase in his A1c his blood sugars at home are generally close to normal including a reading of 50 3 in the morning Morning sugars are actually lower May be getting recently lower readings from increasing creatinine  For this reason he will switch from Amaryl to GLIPIZIDE ER 2.5 mg while continuing Jardiance Encouraged him to continue staying active with walking regularly Also check blood sugars at various times by rotation  Renal dysfunction: Creatinine is higher at 2.5 now This is not from nephropathy and likely related to nephrosclerosis and hypertension  Not clear if he had any changes made by nephrologist and no records are available He will call to make follow-up and request records  Hypertension: Generally well controlled He will continue follow-up with cardiology     There are no Patient Instructions on file for this visit.   Elayne Snare 05/10/2021, 10:08 AM

## 2021-05-10 NOTE — Telephone Encounter (Signed)
Patient has medication in hand.  He should be set for 2023

## 2021-05-10 NOTE — Patient Instructions (Addendum)
Check blood sugars on waking up 3-4 days a week  Also check blood sugars about 2 hours after meals and do this after different meals by rotation  Recommended blood sugar levels on waking up are 90-130 and about 2 hours after meal is 130-180  Please bring your blood sugar monitor to each visit, thank you  Change Glimeperide to Glipizide ER  Call if getting sugars < 70

## 2021-05-11 ENCOUNTER — Telehealth: Payer: Self-pay | Admitting: Internal Medicine

## 2021-05-11 NOTE — Telephone Encounter (Signed)
Patient calling in  Patient is requesting results from labs 12.23.22 be faxed over to Dr. Elayne Snare office.. patient says she met w/ provider yesterday & provider advised him that the results were not showing via patient chart

## 2021-05-11 NOTE — Telephone Encounter (Signed)
Faxed today

## 2021-05-12 ENCOUNTER — Ambulatory Visit: Payer: Medicare HMO | Admitting: *Deleted

## 2021-05-12 DIAGNOSIS — I5040 Unspecified combined systolic (congestive) and diastolic (congestive) heart failure: Secondary | ICD-10-CM

## 2021-05-12 DIAGNOSIS — N1832 Chronic kidney disease, stage 3b: Secondary | ICD-10-CM

## 2021-05-12 DIAGNOSIS — E1165 Type 2 diabetes mellitus with hyperglycemia: Secondary | ICD-10-CM

## 2021-05-12 NOTE — Patient Instructions (Signed)
Visit Information  Alto and [name], thank you for taking time to talk with me today. Please don't hesitate to contact me if I can be of assistance to you before our next scheduled telephone appointment.  Below are the goals we discussed today:  Patient Self-Care Activities: Patient Corey [name] will: Take medications as prescribed Attend all scheduled provider appointments Call pharmacy for medication refills Call provider office for new concerns or questions Continue to check fasting (first thing in the morning, before eating) blood sugars at home Continue to check after-meal blood sugars each day, 2 hours after eating a regular size meal Continue to follow heart healthy, low salt, low cholesterol, carbohydrate-modified, low sugar diet Continue to monitor and record weights at home each day: Today you reported a weight of 160 lb Call office if I gain more than 2 pounds in one day or 5 pounds in one week, especially if this is associated with chest tightness, swelling in your legs/ ankles, and new or worsened shortness of breath Watch for swelling in feet, ankles and legs every day: follow the plan your cardiologist developed to increase your diuretic to 3 times a week if you begin to gain weight or have increased swelling in your legs and ankles Keep legs up while sitting, and continue walking for exercise when the weather is nice outside Keep limiting the salt in your diet: please decrease your salt intake and do not add salt to your meals Keep up the great work preventing falls  Our next scheduled telephone follow up visit/ appointment with care management team member is scheduled on:  Wednesday, June 22, 2021 at 3:00 pm  If you need to cancel or re-schedule our visit, please call [phone] and our care guide team will be happy to assist you.   I look forward to hearing about your progress.   [name], RN, BSN, [place] [phone]: direct office  If you are experiencing a Mental Health or [place] or need someone to talk to, please  call the Suicide and Crisis Lifeline: 988 call the [place] National Suicide Prevention Lifeline: (252) 194-1114 or TTY: (786)145-0031 TTY (701) 694-1164) to talk to a trained counselor call 1-800-273-TALK (toll free, 24 hour hotline) go to [facility] [address], [place] [phone]) call 911   The patient verbalized understanding of instructions, educational materials, and care plan provided today and agreed to receive a mailed copy of patient instructions, educational materials, and care plan  Chronic Kidney Disease, Adult Chronic kidney disease is when lasting damage happens to the kidneys slowly over a long time. The kidneys help to: Make pee (urine). Make hormones. Keep the right amount of fluids and chemicals in the body. Most often, this disease does not go away. You must take steps to help keep the kidney damage from getting worse. If steps are not taken, the kidneys might stop working forever. What are the causes? Diabetes. High blood pressure. Diseases that affect the heart and blood vessels. Other kidney diseases. Diseases of the body's disease-fighting system. A problem with the flow of pee. Infections of the organs that make pee, store it, and take it out of the body. Swelling or irritation of your blood vessels. What increases the risk? Getting older. Having someone in your family who has kidney disease or kidney failure. Having a disease caused by genes. Taking medicines often that harm the kidneys. Being near or having contact with  harmful substances. Being very overweight. Using tobacco now or in the past. What are the signs or symptoms? Feeling very tired. Having a swollen face, legs, ankles, or feet. Feeling like you may vomit or vomiting. Not feeling hungry. Being confused or  not able to focus. Twitches and cramps in the leg muscles or other muscles. Dry, itchy skin. A taste of metal in your mouth. Making less pee, or making more pee. Shortness of breath. Trouble sleeping. You may also become anemic or get weak bones. Anemic means there is not enough red blood cells or hemoglobin in your blood. You may get symptoms slowly. You may not notice them until the kidney damage gets very bad. How is this treated? Often, there is no cure for this disease. Treatment can help with symptoms and help keep the disease from getting worse. You may need to: Avoid alcohol. Avoid foods that are high in salt, potassium, phosphorous, and protein. Take medicines for symptoms and to help control other conditions. Have dialysis. This treatment gets harmful waste out of your body. Treat other problems that cause your kidney disease or make it worse. Follow these instructions at home: Medicines Take over-the-counter and prescription medicines only as told by your doctor. Do not take any new medicines, vitamins, or supplements unless your doctor says it is okay. Lifestyle  Do not smoke or use any products that contain nicotine or tobacco. If you need help quitting, ask your doctor. If you drink alcohol: Limit how much you use to: 0-1 drink a day for women who are not pregnant. 0-2 drinks a day for men. Know how much alcohol is in your drink. In the [place] one drink equals one 12 oz bottle of beer (355 mL), one 5 oz glass of wine (148 mL), or one 1 oz glass of hard liquor (44 mL). Stay at a healthy weight. If you need help losing weight, ask your doctor. General instructions  Follow instructions from your doctor about what you cannot eat or drink. Track your blood pressure at home. Tell your doctor about any changes. If you have diabetes, track your blood sugar. Exercise at least 30 minutes a day, 5 days a week. Keep your shots (vaccinations) up to date. Keep all follow-up  visits. Where to find more information American Association of Kidney Patients: BombTimer.gl National Kidney Foundation: www.kidney.[place]: https://mathis.com/ Life Options: www.lifeoptions.org Kidney School: www.kidneyschool.org Contact a doctor if: Your symptoms get worse. You get new symptoms. Get help right away if: You get symptoms of end-stage kidney disease. These include: Headaches. Losing feeling in your hands or feet. Easy bruising. Having hiccups often. Chest pain. Shortness of breath. Lack of menstrual periods, in women. You have a fever. You make less pee than normal. You have pain or you bleed when you pee or poop. These symptoms may be an emergency. Get help right away. Call your local emergency services (911 in the [place] Do not wait to see if the symptoms will go away. Do not drive yourself to the hospital. Summary Chronic kidney disease is when lasting damage happens to the kidneys slowly over a long time. Causes of this disease include diabetes and high blood pressure. Often, there is no cure for this disease. Treatment can help symptoms and help keep the disease from getting worse. Treatment may involve lifestyle changes, medicines, and dialysis. This information is not intended to replace advice given to you by your health care provider. Make sure you discuss any questions you have with  your health care provider. Document Revised: 08/06/2019 Document Reviewed: 08/06/2019 Elsevier Patient Education  [place].

## 2021-05-12 NOTE — Chronic Care Management (AMB) (Signed)
Chronic Care Management   CCM RN Visit Note  05/12/2021 Name: Corey [name] MRN: [id number] DOB: 04-16-1946  Subjective: Corey [name] is a 75 y.o. year old male who is a primary care patient of Burns, [name], MD. The care management team was consulted for assistance with disease management and care coordination needs.    Engaged with patient by telephone for follow up visit in response to provider referral for case management and/or care coordination services.   Consent to Services:  The patient was given information about Chronic Care Management services, agreed to services, and gave verbal consent prior to initiation of services.  Please see initial visit note for detailed documentation.  Patient agreed to services and verbal consent obtained.   Assessment: Review of patient past medical history, allergies, medications, health status, including review of consultants reports, laboratory and other test data, was performed as part of comprehensive evaluation and provision of chronic care management services.    CCM Care Plan Allergies  Allergen Reactions   Hydrochlorothiazide Other (See Comments)    Gout , uncontrolled diabetes and renal insufficiency   Outpatient Encounter Medications as of 05/12/2021  Medication Sig   ACCU-CHEK AVIVA PLUS test strip TEST BLOOD SUGAR TWICE DAILY AS DIRECTED   ACCU-CHEK AVIVA PLUS test strip Use as instructed   albuterol (VENTOLIN HFA) 108 (90 Base) MCG/ACT inhaler Inhale 2 puffs into the lungs every 4 (four) hours as needed for wheezing or shortness of breath. (Patient not taking: Reported on 04/28/2021)   Alcohol Swabs (DROPSAFE ALCOHOL PREP) 70 % PADS USE 4 (FOUR) TIMES DAILY   allopurinol (ZYLOPRIM) 100 MG tablet Take 1/2 (one-half) tablet by mouth once daily   aspirin EC 81 MG tablet Take 1 tablet (81 mg total) by mouth daily.   bismuth subsalicylate (PEPTO BISMOL) 262 MG/15ML suspension Take 30 mLs by mouth every 6 (six) hours as needed for  indigestion.   carvedilol (COREG) 6.25 MG tablet Take 1 tablet (6.25 mg total) by mouth 2 (two) times daily with a meal.   colchicine (COLCRYS) 0.6 MG tablet Take 2 tabs once and then one tab one hour later as needed for gout (Patient not taking: Reported on 04/28/2021)   empagliflozin (JARDIANCE) 25 MG TABS tablet Take 1 tablet (25 mg total) by mouth daily before breakfast.   glipiZIDE (GLUCOTROL XL) 2.5 MG 24 hr tablet Take 1 tablet (2.5 mg total) by mouth daily with breakfast.   hydrALAZINE (APRESOLINE) 50 MG tablet Take 2 tablets (100 mg total) by mouth 3 (three) times daily.   isosorbide mononitrate (IMDUR) 60 MG 24 hr tablet Take 2 tablets (120 mg total) by mouth daily.   nitroGLYCERIN (NITROSTAT) 0.4 MG SL tablet Place 0.4 mg under the tongue every 5 (five) minutes as needed for chest pain.   rosuvastatin (CRESTOR) 40 MG tablet Take 1 tablet (40 mg total) by mouth daily.   Tafamidis (VYNDAMAX) 61 MG CAPS Take 61 mg by mouth daily.   torsemide (DEMADEX) 20 MG tablet Take 1 tablet (20 mg total) by mouth 2 (two) times a week. Mondays and Fridays (Patient taking differently: Take 20 mg by mouth 3 (three) times a week. Mondays, Wednesdays, and Fridays)   TRUEplus Lancets 33G MISC USE 4 (FOUR) TIMES DAILY (Patient not taking: Reported on 05/10/2021)   No facility-administered encounter medications on file as of 05/12/2021.   Patient Active Problem List   Diagnosis Date Noted   Preoperative cardiovascular examination 04/04/2021   HFrEF (heart failure with reduced  ejection fraction) ([place]) 04/03/2021   Bilateral hearing loss 01/04/2021   Bilateral impacted cerumen 01/04/2021   Tympanic membrane perforation, left 01/04/2021   Physical deconditioning 10/16/2020   Hereditary cardiac amyloidosis ([place]) 09/30/2020   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   Progressive angina (HCC) - Atypical; DOE 09/20/2020   CAD (coronary artery disease) 09/20/2020   Acute on chronic combined systolic and  diastolic CHF (congestive heart failure) ([place]) 09/20/2020   Acute combined systolic and diastolic heart failure ([place]) 09/20/2020   Soft tissue mass 07/30/2020   Contracture of joint of finger of left hand 07/30/2020   Acute respiratory disease due to COVID-19 virus 06/05/2019   DOE (dyspnea on exertion) 03/01/2019   Mild cognitive impairment 12/05/2018   Slurred speech 07/23/2018   Muscle twitching 07/23/2018   OSA (obstructive sleep apnea) 03/27/2018   Snores 01/21/2018   Poor balance 01/23/2017   Ankle swelling, left 09/06/2016   Bradycardia 01/28/2016   Weak urinary stream 12/31/2015   Stage 3b chronic kidney disease 09/15/2015   Ventral hernia without obstruction or gangrene 09/15/2015   Prostate cancer ([place]) 07/25/2015   Erectile dysfunction 05/04/2015   Urinary frequency 05/04/2015   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   Abnormal finding on MRI of brain 02/14/2015   PEA (Pulseless electrical activity) ([place]) 12/03/2012   Diabetes ([place]) 06/30/2011   Gout 03/05/2007   Essential hypertension 03/05/2007   Conditions to be addressed/monitored:  CHF, DMII, and CKD Stage III  Care Plan : Heart Failure (Adult)  Updates made by Corey Royalty, RN since 05/12/2021 12:00 AM     Problem: Symptom Exacerbation (Heart Failure)   Priority: Medium     Long-Range Goal: Symptom Exacerbation Prevented or Minimized   Start Date: 11/04/2020  Expected End Date: 11/04/2021  Recent Progress: Not on track  Priority: Medium  Note:    Case Manager Clinical Goal(s):  11/04/20: Over the next 12 months, patient will verbalize understanding of Heart Failure Action Plan and when to call doctor, as evidenced by patient/ caregiver reporting during CCM RN CM outreach of: Completion of daily weight monitoring/ recording (notifying MD of 3 lb weight gain over night or 5 lb in a week) Adhering to low salt diet Taking prescribed medications as prescribed Interventions:   Collaboration with Corey Rail, MD regarding development and update of comprehensive plan of care as evidenced by provider attestation and co-signature Inter-disciplinary care team collaboration (see longitudinal plan of care Chart reviewed including relevant office notes, upcoming scheduled appointments, and lab results  05/12/21- Goal completed due to duplication of goals, progression of care plan with conversion to new format; goals re-established and extended    Care Plan : Diabetes Type 2 (Adult)  Updates made by Corey Royalty, RN since 05/12/2021 12:00 AM     Problem: Glycemic Management (Diabetes, Type 2)   Priority: Low     Long-Range Goal: Glycemic Management Optimized   Start Date: 11/04/2020  Expected End Date: 11/04/2021  Recent Progress: On track  Priority: Low  Note:   Case Manager Clinical Goal(s):  11/04/20: Over the next 12 months, patient will demonstrate improved adherence to prescribed treatment plan for diabetes self care/management as evidenced by patient and careiver reporting during CCM RN CM outreach of:  Daily monitoring and recording of CBG   Adherence to ADA/ carb modified diet  Adherence to prescribed medication regimen  Contacting provider for new or worsened symptoms or questions Interventions:  Collaboration with Corey Rail, MD regarding  development and update of comprehensive plan of care as evidenced by provider attestation and co-signature Inter-disciplinary care team collaboration (see longitudinal plan of care) Review of patient status, including review of consultants reports, relevant laboratory and other test results, and medications completed  05/12/21- Goal completed due to duplication of goals, progression of care plan with conversion to new format; goals re-established and extended    Care Plan : [place] of Care  Updates made by Corey Royalty, RN since 05/12/2021 12:00 AM     Problem: Chronic Disease Management  Needs   Priority: High     Long-Range Goal: Ongoing adherence to established plan of care for long term chronic disease management   Start Date: 05/12/2021  Expected End Date: 05/12/2022  Priority: High  Note:   Current Barriers:  Chronic Disease Management support and education needs related to CHF and DMII Unable to independently manage medications: reports decreased vision due to glaucoma; wife and son assists in medication management Limited health literacy, hard of hearing in (L) ear: wife participates in all of patient medical affairs and is very supportive/ helpful Challenges following low salt diet  RNCM Clinical Goal(s):  Patient will demonstrate ongoing health management independence as evidenced by DMII; CHF/ CKD- III        through collaboration with RN Care manager, provider, and care team.   Interventions: 1:1 collaboration with primary care provider regarding development and update of comprehensive plan of care as evidenced by provider attestation and co-signature Inter-disciplinary care team collaboration (see longitudinal plan of care) Evaluation of current treatment plan related to  self management and patient's adherence to plan as established by provider Pain assessment updated: patient denies acute/ chronic pain Falls assessment updated: patient continues to deny falls- does not use assistive devices; positive reinforcement provided with encouragement to continue efforts Discussed plans for upcoming surgery for hernia repair- patient reports will be glad to get hernia repaired  Heart Failure Interventions:  (Status: Goal on Track (progressing): YES.)  Long Term Goal  Wt Readings from Last 3 Encounters:  05/10/21 162 lb 12.8 oz (73.8 kg)  04/04/21 167 lb 9.6 oz (76 kg)  03/10/21 159 lb 9.6 oz (72.4 kg)  Basic overview and discussion of pathophysiology of Heart Failure reviewed Provided education on low sodium diet Discussed importance of daily weight and advised  patient to weigh and record daily Reviewed role of diuretics in prevention of fluid overload and management of heart failure Discussed the importance of keeping all appointments with provider Advised patient to discuss recent lab values for kidney function with provider Discussed current  clinical condition with patient/ caregiver and confirmed no current clinical or medication concerns; confirmed patient's spouse or his son continues to manage medications for patient due to decreased vision Continues trying to follow diabetic, low carb, sugar, salt diet: continues to struggle because he can't eat what others in the family do; reports he continues making progress with his efforts- previously provided education around dietary strategies reinforced, patient encouraged to continue his efforts towards adherence Confirmed continues to perform daily weight monitoring/ recording at home: reports today weight of "160 lbs" which is generally in line with recently reported weight ranges between 160-164 lbs; continues to deny overnight weight gain > 3 lbs/ weekly > 5 lbs Reports lower extremity swelling "at baseline;" "not bad;" "the same" and confirms continues taking diuretic M-W-F Reinforced previously provided education with patient regarding signs/ symptoms yellow CHF zone along with corresponding action plan- patient requires  ongoing reinforcement; Confirmed no medication concerns today; reports taking medications as prescribed Reviewed 04/04/21 cardiology provider appointment- confirmed no medication changes, however, discussed medication review at office visit with patient/ his spouse-- they report their son attended visit with patient and "straightened out" his medications; wife reports cardiologist continuing to assist in "getting him the experimental medication" he needs "for his heart"  Confirmed patient/ spouse working with CCM pharmacy team- encouraged their ongoing engagement with CCM team Confirmed  patient continues to use CPAP at night: reports upcoming cardiology/ sleep study visit for new CPAP titration 06/06/21-- patient unaware of this scheduled visit; appreciative of update  Diabetes:  (Status: Goal on Track (progressing): YES.) Long Term Goal   Lab Results  Component Value Date   HGBA1C 7.2 (H) 05/06/2021  Assessed patient's understanding of A1c goal: <7% Provided education to patient about basic DM disease process; Reviewed prescribed diet with patient low salt/ heart healthy, low carbohydrate, low sugar; Discussed plans with patient for ongoing care management follow up and provided patient with direct contact information for care management team;      Reviewed scheduled/upcoming provider appointments including: 06/06/21- CPAP titration; 06/10/21- pre-admissions/ pre-surgery labs; 06/16/21- surgery: hernia repair; 06/29/21- CCM pharmacy team; 07/05/21- cardiology provider;         Review of patient status, including review of consultants reports, relevant laboratory and other test results, and medications completed;       Reviewed recent endocrinology provider office visit 05/10/21- reviewed medication changes made at visit: patient/ spouse confirm they have made recommended changes to medications Reviewed recent lab values for kidney function: confirmed that their request to fax lab values to endocrinology team was completed; we discussed basics of lab results for kidney function and I encouraged patient and spouse to get additional information about significance of lab results from endocrinology, PCP, and cardiology providers Confirmed patient finally obtained his diabetic shoes and is enjoying Assessed patient's understanding of meaning/ significance of A1-C values: ongoing fair baseline understanding of same- reviewed individual historical A1-C trends and provided education around correlation of A1-C value to blood sugar levels at home over 3 months Reviewed recent blood sugars at home  with patient- he reports consistent fasting and post-prandial ranges between 100-130's:  reports last night post-prandial  value of "104," and fasting this morning of "114"-- positive reinforcement provided with encouragement to continue efforts Reinforced previously provided education around management of low blood sugars at home: today, he denies blood sugars < 80 and continues to deny signs/ symptoms hypoglycemia;   Patient Goals/Self-Care Activities: As evidenced by review of EHR, collaboration with care team, and patient reporting during CCM RN CM outreach,  Patient Corey [name] will: Take medications as prescribed Attend all scheduled provider appointments Call pharmacy for medication refills Call provider office for new concerns or questions Continue to check fasting (first thing in the morning, before eating) blood sugars at home Continue to check after-meal blood sugars each day, 2 hours after eating a regular size meal Continue to follow heart healthy, low salt, low cholesterol, carbohydrate-modified, low sugar diet Continue to monitor and record weights at home each day: Today you reported a weight of 160 lb Call office if I gain more than 2 pounds in one day or 5 pounds in one week, especially if this is associated with chest tightness, swelling in your legs/ ankles, and new or worsened shortness of breath Watch for swelling in feet, ankles and legs every day: follow the plan your cardiologist developed to increase your diuretic  to 3 times a week if you begin to gain weight or have increased swelling in your legs and ankles Keep legs up while sitting, and continue walking for exercise when the weather is nice outside Keep limiting the salt in your diet: please decrease your salt intake and do not add salt to your meals Keep up the great work preventing falls    Plan: Telephone follow up appointment with care management team member scheduled for:  Wednesday, June 22, 2021 at 3:00 pm The  patient has been provided with contact information for the care management team and has been advised to call with any health related questions or concerns  Corey Rack, RN, BSN, [place] [phone]: direct office

## 2021-05-12 NOTE — Telephone Encounter (Addendum)
Patient is scheduled for CPAP Titration on 06-06-20. Patient understands his titration study will be done at [facility] sleep lab. Patient understands he will receive a letter in a week or so detailing appointment, date, time, and location.

## 2021-05-14 DIAGNOSIS — I10 Essential (primary) hypertension: Secondary | ICD-10-CM | POA: Diagnosis not present

## 2021-05-14 DIAGNOSIS — I5040 Unspecified combined systolic (congestive) and diastolic (congestive) heart failure: Secondary | ICD-10-CM | POA: Diagnosis not present

## 2021-05-14 DIAGNOSIS — E1122 Type 2 diabetes mellitus with diabetic chronic kidney disease: Secondary | ICD-10-CM | POA: Diagnosis not present

## 2021-05-14 DIAGNOSIS — E1165 Type 2 diabetes mellitus with hyperglycemia: Secondary | ICD-10-CM

## 2021-05-14 DIAGNOSIS — Z794 Long term (current) use of insulin: Secondary | ICD-10-CM | POA: Diagnosis not present

## 2021-05-17 ENCOUNTER — Ambulatory Visit: Payer: Self-pay | Admitting: Surgery

## 2021-05-18 ENCOUNTER — Encounter (HOSPITAL_BASED_OUTPATIENT_CLINIC_OR_DEPARTMENT_OTHER): Payer: Medicare HMO | Admitting: Cardiology

## 2021-05-30 ENCOUNTER — Other Ambulatory Visit: Payer: Self-pay | Admitting: Endocrinology

## 2021-05-30 DIAGNOSIS — E1165 Type 2 diabetes mellitus with hyperglycemia: Secondary | ICD-10-CM

## 2021-06-02 ENCOUNTER — Telehealth: Payer: Self-pay

## 2021-06-02 NOTE — Chronic Care Management (AMB) (Signed)
Chronic Care Management Pharmacy Assistant   Name: Corey [name]  MRN: [id number] DOB: 02/28/1946   Reason for Encounter: Disease State   Conditions to be addressed/monitored: HTN  Recent office visits:  None ID  Recent consult visits:  05/10/21 Elayne Snare, MD-Endocrinology (Diabetes mellitus) no med changes  Hospital visits:  None in previous 6 months  Medications: Outpatient Encounter Medications as of 06/02/2021  Medication Sig   ACCU-CHEK AVIVA PLUS test strip TEST BLOOD SUGAR TWICE DAILY AS DIRECTED   ACCU-CHEK AVIVA PLUS test strip Use as instructed   albuterol (VENTOLIN HFA) 108 (90 Base) MCG/ACT inhaler Inhale 2 puffs into the lungs every 4 (four) hours as needed for wheezing or shortness of breath. (Patient not taking: Reported on 04/28/2021)   Alcohol Swabs (DROPSAFE ALCOHOL PREP) 70 % PADS USE 4 (FOUR) TIMES DAILY   allopurinol (ZYLOPRIM) 100 MG tablet Take 1/2 (one-half) tablet by mouth once daily   aspirin EC 81 MG tablet Take 1 tablet (81 mg total) by mouth daily.   bismuth subsalicylate (PEPTO BISMOL) 262 MG/15ML suspension Take 30 mLs by mouth every 6 (six) hours as needed for indigestion.   carvedilol (COREG) 6.25 MG tablet Take 1 tablet (6.25 mg total) by mouth 2 (two) times daily with a meal.   colchicine (COLCRYS) 0.6 MG tablet Take 2 tabs once and then one tab one hour later as needed for gout (Patient not taking: Reported on 04/28/2021)   empagliflozin (JARDIANCE) 25 MG TABS tablet Take 1 tablet (25 mg total) by mouth daily before breakfast.   glipiZIDE (GLUCOTROL XL) 2.5 MG 24 hr tablet Take 1 tablet (2.5 mg total) by mouth daily with breakfast.   hydrALAZINE (APRESOLINE) 50 MG tablet Take 2 tablets (100 mg total) by mouth 3 (three) times daily.   isosorbide mononitrate (IMDUR) 60 MG 24 hr tablet Take 2 tablets (120 mg total) by mouth daily.   nitroGLYCERIN (NITROSTAT) 0.4 MG SL tablet Place 0.4 mg under the tongue every 5 (five) minutes as needed for  chest pain.   rosuvastatin (CRESTOR) 40 MG tablet Take 1 tablet (40 mg total) by mouth daily.   Tafamidis (VYNDAMAX) 61 MG CAPS Take 61 mg by mouth daily.   torsemide (DEMADEX) 20 MG tablet Take 1 tablet (20 mg total) by mouth 2 (two) times a week. Mondays and Fridays (Patient taking differently: Take 20 mg by mouth 3 (three) times a week. Mondays, Wednesdays, and Fridays)   TRUEplus Lancets 33G MISC USE 4 (FOUR) TIMES DAILY (Patient not taking: Reported on 05/10/2021)   No facility-administered encounter medications on file as of 06/02/2021.   Reviewed chart prior to disease state call. Spoke with patient regarding BP  Recent Office Vitals: BP Readings from Last 3 Encounters:  05/10/21 140/62  04/04/21 (!) 150/80  03/10/21 (!) 154/78   Pulse Readings from Last 3 Encounters:  05/10/21 (!) 53  04/04/21 72  03/10/21 (!) 55    Wt Readings from Last 3 Encounters:  05/10/21 162 lb 12.8 oz (73.8 kg)  04/04/21 167 lb 9.6 oz (76 kg)  03/10/21 159 lb 9.6 oz (72.4 kg)     Kidney Function Lab Results  Component Value Date/Time   CREATININE 2.51 (H) 05/06/2021 10:04 AM   CREATININE 2.07 (H) 02/07/2021 09:23 AM   CREATININE 1.69 (H) 01/28/2020 10:00 AM   GFR 24.83 (L) 05/06/2021 10:04 AM   GFRNONAA 30 (L) 10/27/2020 10:47 AM   GFRNONAA 40 (L) 01/28/2020 10:00 AM   GFRAA 46 (L)  01/28/2020 10:00 AM    BMP Latest Ref Rng & Units 05/06/2021 02/07/2021 01/03/2021  Glucose 70 - 99 mg/dL 83 104(H) 125(H)  BUN 6 - 23 mg/dL 42(H) 37(H) 44(H)  Creatinine 0.40 - 1.50 mg/dL 2.51(H) 2.07(H) 1.97(H)  BUN/Creat Ratio 10 - 24 - - -  Sodium 135 - 145 mEq/L 143 142 140  Potassium 3.5 - 5.1 mEq/L 4.4 4.3 4.4  Chloride 96 - 112 mEq/L 108 111 107  CO2 19 - 32 mEq/L 30 24 26   Calcium 8.4 - 10.5 mg/dL 9.7 9.8 10.3   Reviewed chart prior to disease state call. Spoke with patient regarding BP Current antihypertensive regimen:  Carvedilol 6.25 mg hydralazine 50 mg Isosorbide 60 mg How often are you  checking your Blood Pressure? daily  Current home BP readings: 120/80 yesterday morning  What recent interventions/DTPs have been made by any provider to improve Blood Pressure control since last CPP Visit: Continue current blood pressure meds  Any recent hospitalizations or ED visits since last visit with CPP? No  What diet changes have been made to improve Blood Pressure Control?  Patient states that he does not eat salt  What exercise is being done to improve your Blood Pressure Control?  Patient states that he walks when he can to stay active  Adherence Review: Is the patient currently on ACE/ARB medication? No Does the patient have >5 day gap between last estimated fill dates? No  Care Gaps: Colonoscopy-06/22/15 Diabetic Foot Exam-12/14/2 Ophthalmology-05/27/18 Dexa Scan - NA  Annual Well Visit - NA Micro albumin-11/23/20 Hemoglobin A1c- 05/06/21  Star Rating Drugs: Jardiance 25 mg-last fill 05/29/21 30 ds Rosuvastatin 40 mg-last fill 03/20/21 90 ds  Ethelene Hal Clinical Pharmacist Assistant [phone]

## 2021-06-03 ENCOUNTER — Ambulatory Visit
Admission: RE | Admit: 2021-06-03 | Discharge: 2021-06-03 | Disposition: A | Discharge status: Home / Self Care | Payer: Medicare HMO | Source: Ambulatory Visit | Attending: Nephrology | Admitting: Nephrology

## 2021-06-03 DIAGNOSIS — N281 Cyst of kidney, acquired: Secondary | ICD-10-CM | POA: Diagnosis not present

## 2021-06-03 DIAGNOSIS — N1832 Chronic kidney disease, stage 3b: Secondary | ICD-10-CM

## 2021-06-03 DIAGNOSIS — N261 Atrophy of kidney (terminal): Secondary | ICD-10-CM | POA: Diagnosis not present

## 2021-06-03 DIAGNOSIS — N2889 Other specified disorders of kidney and ureter: Secondary | ICD-10-CM | POA: Diagnosis not present

## 2021-06-03 DIAGNOSIS — R6889 Other general symptoms and signs: Secondary | ICD-10-CM | POA: Diagnosis not present

## 2021-06-06 ENCOUNTER — Other Ambulatory Visit: Payer: Self-pay

## 2021-06-06 ENCOUNTER — Ambulatory Visit (HOSPITAL_BASED_OUTPATIENT_CLINIC_OR_DEPARTMENT_OTHER): Payer: Medicare HMO | Attending: Cardiology | Admitting: Cardiology

## 2021-06-08 ENCOUNTER — Telehealth: Payer: Self-pay | Admitting: *Deleted

## 2021-06-08 DIAGNOSIS — G4733 Obstructive sleep apnea (adult) (pediatric): Secondary | ICD-10-CM

## 2021-06-08 NOTE — Procedures (Signed)
Erroneous encounter

## 2021-06-08 NOTE — Progress Notes (Signed)
This encounter was created in error - please disregard.

## 2021-06-09 NOTE — Patient Instructions (Addendum)
DUE TO COVID-19 ONLY ONE VISITOR IS ALLOWED TO COME WITH YOU AND STAY IN THE WAITING ROOM ONLY DURING PRE OP AND PROCEDURE.   **NO VISITORS ARE ALLOWED IN THE SHORT STAY AREA OR RECOVERY ROOM!!**  You are not required to quarantine, however you are required to wear a well-fitted mask when you are out and around people not in your household.  Hand Hygiene often Do NOT share personal items Notify your provider if you are in close contact with someone who has COVID or you develop fever 100.4 or greater, new onset of sneezing, cough, sore throat, shortness of breath or body aches.       Your procedure is scheduled on: Thursday, 06-16-21   Report to [facility] Main  Entrance     Report to admitting at 5:15 AM   Call this number if you have problems the morning of surgery [phone]   Do not eat food :After Midnight.   May have liquids until 4:30 AM day of surgery  CLEAR LIQUID DIET  Foods Allowed                                                                     Foods Excluded  Water, Black Coffee (no milk/no creamer) and tea, regular and decaf                              liquids that you cannot  Plain Jell-O in any flavor  (No red)                         see through such as: Fruit ices (not with fruit pulp)                                 milk, soups, orange juice  Iced Popsicles (No red)                                    All solid food                             Apple juices Sports drinks like Gatorade (No red) Lightly seasoned clear broth or consume(fat free) Sugar    Oral Hygiene is also important to reduce your risk of infection.                                    Remember - BRUSH YOUR TEETH THE MORNING OF SURGERY WITH YOUR REGULAR TOOTHPASTE   Do NOT smoke after Midnight  Take these medicines the morning of surgery with A SIP OF WATER:  Allopurinol, Carvedilol, Colchicine, Hydralazine, Tafamidis, Isosorbide, Rosuvastatin.  Okay to use inhalers  How to Manage  Your Diabetes Before and After Surgery  Why is it important to control my blood sugar before and after surgery? Improving blood sugar levels before and after surgery helps healing and can limit problems. A way of improving  blood sugar control is eating a healthy diet by:  Eating less sugar and carbohydrates  Increasing activity/exercise  Talking with your doctor about reaching your blood sugar goals High blood sugars (greater than 180 mg/dL) can raise your risk of infections and slow your recovery, so you will need to focus on controlling your diabetes during the weeks before surgery. Make sure that the doctor who takes care of your diabetes knows about your planned surgery including the date and location.  How do I manage my blood sugar before surgery? Check your blood sugar at least 4 times a day, starting 2 days before surgery, to make sure that the level is not too high or low. Check your blood sugar the morning of your surgery when you wake up and every 2 hours until you get to the Short Stay unit. If your blood sugar is less than 70 mg/dL, you will need to treat for low blood sugar: Do not take insulin. Treat a low blood sugar (less than 70 mg/dL) with  cup of clear juice (cranberry or apple), 4 glucose tablets, OR glucose gel. Recheck blood sugar in 15 minutes after treatment (to make sure it is greater than 70 mg/dL). If your blood sugar is not greater than 70 mg/dL on recheck, call [phone] for further instructions. Report your blood sugar to the short stay nurse when you get to Short Stay.  If you are admitted to the hospital after surgery: Your blood sugar will be checked by the staff and you will probably be given insulin after surgery (instead of oral diabetes medicines) to make sure you have good blood sugar levels. The goal for blood sugar control after surgery is 80-180 mg/dL.  WHAT DO I DO ABOUT MY DIABETES MEDICATION?  Do not take oral diabetes medicines (pills) the  morning of surgery.  THE NIGHT BEFORE SURGERY:  Take Glipizide as prescribed.                                                      Do not take Jardiance      THE MORNING OF SURGERY:  Do not take Glipizide or Jardiance.  Reviewed and Endorsed by [facility] Patient Education Committee, August 2015    Stop all vitamins and herbal supplements a week before surgery   Bring CPAP mask and tubing day of surgery             You may not have any metal on your body including  jewelry, and body piercing             Do not wear lotions, powders, cologne, or deodorant              Men may shave face and neck.  Do not bring valuables to the hospital. [place].   Contacts, dentures or bridgework may not be worn into surgery.     Patients discharged the day of surgery will not be allowed to drive home.   A responsible adult must remain with you for 24 hours after surgery.  Please read over the following fact sheets you were given: IF YOU HAVE QUESTIONS ABOUT YOUR PRE OP INSTRUCTIONS PLEASE CALL [place] - Preparing for Surgery Before surgery, you can play an important role.  Because skin is  not sterile, your skin needs to be as free of germs as possible.  You can reduce the number of germs on your skin by washing with CHG (chlorahexidine gluconate) soap before surgery.  CHG is an antiseptic cleaner which kills germs and bonds with the skin to continue killing germs even after washing. Please DO NOT use if you have an allergy to CHG or antibacterial soaps.  If your skin becomes reddened/irritated stop using the CHG and inform your nurse when you arrive at Short Stay. Do not shave (including legs and underarms) for at least 48 hours prior to the first CHG shower.  You may shave your face/neck.  Please follow these instructions carefully:  1.  Shower with CHG Soap the night before surgery and the  morning of surgery.  2.  If you choose to  wash your hair, wash your hair first as usual with your normal  shampoo.  3.  After you shampoo, rinse your hair and body thoroughly to remove the shampoo.                             4.  Use CHG as you would any other liquid soap.  You can apply chg directly to the skin and wash.  Gently with a scrungie or clean washcloth.  5.  Apply the CHG Soap to your body ONLY FROM THE NECK DOWN.   Do   not use on face/ open                           Wound or open sores. Avoid contact with eyes, ears mouth and   genitals (private parts).                       Wash face,  Genitals (private parts) with your normal soap.             6.  Wash thoroughly, paying special attention to the area where your    surgery  will be performed.  7.  Thoroughly rinse your body with warm water from the neck down.  8.  DO NOT shower/wash with your normal soap after using and rinsing off the CHG Soap.                9.  Pat yourself dry with a clean towel.            10.  Wear clean pajamas.            11.  Place clean sheets on your bed the night of your first shower and do not  sleep with pets. Day of Surgery : Do not apply any lotions/deodorants the morning of surgery.  Please wear clean clothes to the hospital/surgery center.  FAILURE TO FOLLOW THESE INSTRUCTIONS MAY RESULT IN THE CANCELLATION OF YOUR SURGERY  PATIENT SIGNATURE_________________________________  NURSE SIGNATURE__________________________________  ________________________________________________________________________

## 2021-06-09 NOTE — Progress Notes (Addendum)
COVID swab appointment: N/A  COVID Vaccine Completed: Yes x2 Date COVID Vaccine completed: 07-08-19 07-29-19 Has received booster: Yes x1 02-07-20 COVID vaccine manufacturer: Pfizer      Date of COVID positive in last 90 days:  N/A  PCP - Billey Gosling, MD (office note requested x2) Cardiologist - Candee Furbish, MD  Chest x-ray - 10-14-20 Epic EKG - 03-10-21 Epic Stress Test - 10-10-19 Epic ECHO - 09-30-20 Epic Cardiac Cath - 09-20-20 Epic Pacemaker/ICD device last checked: Spinal Cord Stimulator:  Sleep Study - Yes, +sleep apnea CPAP - Yes  Fasting Blood Sugar - 98 to low 100 range Checks Blood Sugar - 1 time a day  Blood Thinner Instructions: Aspirin Instructions: ASA 81 mg.  Patient to check to see if he needs to stop Last Dose:  Activity level:   Can go up a flight of stairs and perform activities of daily living without stopping and without symptoms of chest pain or shortness of breath.   Anesthesia review:  CAD, CHF, cardiac amyloidosis, hx of PEA, HTN, DM, OSA, CKD (BUN 42 and creatinine 2.19 on PAT labs)  BP elevated at PAT 163/117 and on recheck 160/112.  Patient did not take his BP med this morning.  Denies headache, chest pain or shortness of breath.  CBG 74, given orange juice and graham crackers.  CBG on recheck 63.  Patient took [place] but did eat breakfast.   Lyndon Code, PA-C made aware and pt was advised to eat lunch and take BP meds and monitor at home.  He will notify PCP if numbers do not normalize.  Patient denies shortness of breath, fever, cough and chest pain at PAT appointment  Patient verbalized understanding of instructions that were given to them at the PAT appointment. Patient was also instructed that they will need to review over the PAT instructions again at home before surgery.

## 2021-06-10 ENCOUNTER — Encounter (HOSPITAL_COMMUNITY)
Admission: RE | Admit: 2021-06-10 | Discharge: 2021-06-10 | Disposition: A | Discharge status: Home / Self Care | Payer: Medicare HMO | Source: Ambulatory Visit | Attending: Surgery | Admitting: Surgery

## 2021-06-10 ENCOUNTER — Encounter (HOSPITAL_COMMUNITY): Payer: Self-pay

## 2021-06-10 ENCOUNTER — Other Ambulatory Visit: Payer: Self-pay

## 2021-06-10 VITALS — BP 116/112 | HR 55 | Temp 98.2°F | Resp 20 | Ht 66.0 in | Wt 165.6 lb

## 2021-06-10 DIAGNOSIS — E854 Organ-limited amyloidosis: Secondary | ICD-10-CM | POA: Insufficient documentation

## 2021-06-10 DIAGNOSIS — I43 Cardiomyopathy in diseases classified elsewhere: Secondary | ICD-10-CM | POA: Diagnosis not present

## 2021-06-10 DIAGNOSIS — I130 Hypertensive heart and chronic kidney disease with heart failure and stage 1 through stage 4 chronic kidney disease, or unspecified chronic kidney disease: Secondary | ICD-10-CM | POA: Diagnosis not present

## 2021-06-10 DIAGNOSIS — Z01818 Encounter for other preprocedural examination: Secondary | ICD-10-CM

## 2021-06-10 DIAGNOSIS — Z01812 Encounter for preprocedural laboratory examination: Secondary | ICD-10-CM | POA: Diagnosis not present

## 2021-06-10 DIAGNOSIS — I2510 Atherosclerotic heart disease of native coronary artery without angina pectoris: Secondary | ICD-10-CM | POA: Insufficient documentation

## 2021-06-10 DIAGNOSIS — N183 Chronic kidney disease, stage 3 unspecified: Secondary | ICD-10-CM | POA: Diagnosis not present

## 2021-06-10 DIAGNOSIS — K4090 Unilateral inguinal hernia, without obstruction or gangrene, not specified as recurrent: Secondary | ICD-10-CM | POA: Diagnosis not present

## 2021-06-10 DIAGNOSIS — I5032 Chronic diastolic (congestive) heart failure: Secondary | ICD-10-CM | POA: Diagnosis not present

## 2021-06-10 DIAGNOSIS — E1122 Type 2 diabetes mellitus with diabetic chronic kidney disease: Secondary | ICD-10-CM | POA: Diagnosis not present

## 2021-06-10 LAB — BASIC METABOLIC PANEL
Anion gap: 7 (ref 5–15)
BUN: 42 mg/dL — ABNORMAL HIGH (ref 8–23)
CO2: 27 mmol/L (ref 22–32)
Calcium: 9.8 mg/dL (ref 8.9–10.3)
Chloride: 108 mmol/L (ref 98–111)
Creatinine, Ser: 2.19 mg/dL — ABNORMAL HIGH (ref 0.61–1.24)
GFR, Estimated: 31 mL/min — ABNORMAL LOW (ref >60)
Glucose, Bld: 108 mg/dL — ABNORMAL HIGH (ref 70–99)
Potassium: 4.2 mmol/L (ref 3.5–5.1)
Sodium: 142 mmol/L (ref 135–145)

## 2021-06-10 LAB — CBC
HCT: 43.2 % (ref 39.0–52.0)
Hemoglobin: 13.4 g/dL (ref 13.0–17.0)
MCH: 26.4 pg (ref 26.0–34.0)
MCHC: 31 g/dL (ref 30.0–36.0)
MCV: 85.2 fL (ref 80.0–100.0)
Platelets: 127 10*3/uL — ABNORMAL LOW (ref 150–400)
RBC: 5.07 MIL/uL (ref 4.22–5.81)
RDW: 16.1 % — ABNORMAL HIGH (ref 11.5–15.5)
WBC: 2.6 10*3/uL — ABNORMAL LOW (ref 4.0–10.5)
nRBC: 0 % (ref 0.0–0.2)

## 2021-06-10 LAB — GLUCOSE, CAPILLARY
Glucose-Capillary: 74 mg/dL (ref 70–99)
Glucose-Capillary: 63 mg/dL — ABNORMAL LOW (ref 70–99)

## 2021-06-13 ENCOUNTER — Other Ambulatory Visit (HOSPITAL_BASED_OUTPATIENT_CLINIC_OR_DEPARTMENT_OTHER): Payer: Medicare HMO | Admitting: Cardiology

## 2021-06-13 NOTE — Progress Notes (Signed)
Anesthesia Chart Review   Case: [id number] Date/Time: 06/16/21 0715   Procedure: LAPAROSCOPIC, POSSIBLY OPEN LEFT INGUINAL HERNIA REPAIR WITH MESH (Left)   Anesthesia type: General   Pre-op diagnosis: LEFT INGUINAL HERNIA   Location: [facility] ROOM 04 / [facility]   Surgeons: Stechschulte, Nickola Major, MD       DISCUSSION:76 y.o. former smoker with h/o HTN, DM II, OSA, HFrEF, hereditary cardiac amyloidosis, CAD (cardiac cath 09/2020 with 80% mid LAD stenosis managed medically), CKD Stage III, left inguinal hernia scheduled for above procedure 06/16/21 with Dr. Louanna Raw.   Pt last seen by cardiology 06/04/20. Per OV note, "Mr. Kirst's perioperative risk of a major cardiac event is 11% according to the Revised Cardiac Risk Index (RCRI).  Therefore, he is at high risk for perioperative complications.   His functional capacity is fair at 4.19 METs according to the Duke Activity Status Index (DASI). Recommendations: According to ACC/AHA guidelines, no further cardiovascular testing needed.  I will review his case further with Dr. Marlou Porch and/or Dr. Audie Box prior to making final recommendations.   Addendum: I did review his case with Dr. Audie Box and Dr. Marlou Porch.  As outlined, it is felt that he will be high (but not prohibitive) risk for any procedure.  Close attention needs to be paid to volume status to avoid volume overload.  No further testing is needed.  It is reasonable to proceed with surgery as planned.  Our service is available as needed."  Anticipate pt can proceed with planned procedure barring acute status change.   VS: BP (!) 116/112    Pulse (!) 55    Temp 36.8 C (Oral)    Resp 20    Ht 5\' 6"  (1.676 m)    Wt 75.1 kg    SpO2 100%    BMI 26.73 kg/m   PROVIDERS: [name], MD is PCP   Candee Furbish, MD is Cardiologist  LABS: Labs reviewed: Acceptable for surgery. (all labs ordered are listed, but only abnormal results are displayed)  Labs Reviewed  CBC - Abnormal; Notable for the following  components:      Result Value   WBC 2.6 (*)    RDW 16.1 (*)    Platelets 127 (*)    All other components within normal limits  BASIC METABOLIC PANEL - Abnormal; Notable for the following components:   Glucose, Bld 108 (*)    BUN 42 (*)    Creatinine, Ser 2.19 (*)    GFR, Estimated 31 (*)    All other components within normal limits  GLUCOSE, CAPILLARY - Abnormal; Notable for the following components:   Glucose-Capillary 63 (*)    All other components within normal limits  GLUCOSE, CAPILLARY     IMAGES:   EKG: 03/10/2021 Rate 50 bpm  Sinus bradycardia with 1st degree AV block  Nonspecific intraventricular conduction delay ST & T wave abnormality, consider lateral ischemia   CV: Echo 09/30/2020 1. Trace pericardial fluid posterior and around right AV groove. Compared  to prior study 09/21/20, pericardial effusion has essentially resolved.   2. Left ventricular ejection fraction, by estimation, is 40%. The left  ventricle has moderately decreased function. The left ventricle  demonstrates global hypokinesis. There is severe left ventricular  hypertrophy.   3. Right ventricular systolic function is moderately reduced. The right  ventricular size is normal. Severely increased right ventricular wall  thickness.   4. Trivial mitral valve regurgitation.   5. The inferior vena cava is dilated in size  with <50% respiratory  variability, suggesting right atrial pressure of 15 mmHg.  Past Medical History:  Diagnosis Date   Cataract    CHF (congestive heart failure) ([place])    Chronic heart failure with preserved ejection fraction (HFpEF) ([place])    Colon polyp 2003   Dr [name]; F/U was to be 2008( not completed)   CVA (cerebral infarction) 2014   Diabetes mellitus    Diverticulosis 2003   Dyspnea    with exertion   Gout    Hypertension    Myocardial infarction ([place]) 2014   ?? PEA cardiac arrest   OSA (obstructive sleep apnea) 03/27/2018   Pneumonia 09/29/2011   Avelox  X 10 days as OP   Prostate cancer ([place])    Renal insufficiency    Seizures ([place])    not treated for seizure disorder; had a seizure after stroke 2014; no seizure since then    Past Surgical History:  Procedure Laterality Date   COLONOSCOPY W/ POLYPECTOMY  2003   no F/U ([place] discussed 12/03/12)   INSERTION OF MESH N/A 07/16/2017   Procedure: INSERTION OF MESH;  Surgeon: [name], MD;  Location: [facility];  Service: General;  Laterality: N/A;   PEG PLACEMENT  2014   PEG TUBE REMOVAL  2014   PROSTATE BIOPSY     RIGHT/LEFT HEART CATH AND CORONARY ANGIOGRAPHY N/A 09/20/2020   Procedure: RIGHT/LEFT HEART CATH AND CORONARY ANGIOGRAPHY;  Surgeon: Leonie Man, MD;  Location: [facility];  Service: Cardiovascular;  Laterality: N/A;   TEE WITHOUT CARDIOVERSION N/A 09/27/2020   Procedure: TRANSESOPHAGEAL ECHOCARDIOGRAM (TEE);  Surgeon: Wonda Olds, MD;  Location: [facility];  Service: Thoracic;  Laterality: N/A;   TRACHEOSTOMY  2014   was closed after hospital stay   [place] N/A 07/16/2017   Procedure: [place];  Surgeon: [name], MD;  Location: [facility];  Service: General;  Laterality: N/A;   wrist aspiration  02/16/12    monosodium urate crystals; Dr Caralyn Guile    MEDICATIONS:  ACCU-CHEK AVIVA PLUS test strip   ACCU-CHEK AVIVA PLUS test strip   albuterol (VENTOLIN HFA) 108 (90 Base) MCG/ACT inhaler   Alcohol Swabs (DROPSAFE ALCOHOL PREP) 70 % PADS   allopurinol (ZYLOPRIM) 100 MG tablet   aspirin EC 81 MG tablet   bismuth subsalicylate (PEPTO BISMOL) 262 MG/15ML suspension   carvedilol (COREG) 6.25 MG tablet   colchicine (COLCRYS) 0.6 MG tablet   glipiZIDE (GLUCOTROL XL) 2.5 MG 24 hr tablet   hydrALAZINE (APRESOLINE) 50 MG tablet   isosorbide mononitrate (IMDUR) 60 MG 24 hr tablet   JARDIANCE 10 MG TABS tablet   nitroGLYCERIN (NITROSTAT) 0.4 MG SL tablet   rosuvastatin (CRESTOR) 40 MG tablet   Tafamidis (VYNDAMAX) 61 MG  CAPS   torsemide (DEMADEX) 20 MG tablet   TRUEplus Lancets 33G MISC   No current facility-administered medications for this encounter.     Konrad Felix Ward, PA-C [facility] [phone]

## 2021-06-13 NOTE — Anesthesia Preprocedure Evaluation (Addendum)
Anesthesia Evaluation  Patient identified by MRN, date of birth, ID band Patient awake    Reviewed: Allergy & Precautions, NPO status , Patient's Chart, lab work & pertinent test results  Airway Mallampati: III  TM Distance: <3 FB Neck ROM: Limited    Dental  (+) Dental Advisory Given   Pulmonary shortness of breath, sleep apnea , former smoker,    breath sounds clear to auscultation       Cardiovascular hypertension, Pt. on medications + CAD, + Past MI, +CHF and + DOE   Rhythm:Regular Rate:Normal     Neuro/Psych Seizures -,     GI/Hepatic negative GI ROS, Neg liver ROS,   Endo/Other  diabetes  Renal/GU CRFRenal disease     Musculoskeletal   Abdominal   Peds  Hematology negative hematology ROS (+)   Anesthesia Other Findings   Reproductive/Obstetrics                            Anesthesia Physical Anesthesia Plan  ASA: 3  Anesthesia Plan: General   Post-op Pain Management: Lidocaine infusion, Tylenol PO (pre-op) and Minimal or no pain anticipated   Induction:   PONV Risk Score and Plan: 2 and Dexamethasone, Ondansetron and Treatment may vary due to age or medical condition  Airway Management Planned: Oral ETT  Additional Equipment: None  Intra-op Plan:   Post-operative Plan: Extubation in OR  Informed Consent: I have reviewed the patients History and Physical, chart, labs and discussed the procedure including the risks, benefits and alternatives for the proposed anesthesia with the patient or authorized representative who has indicated his/her understanding and acceptance.     Dental advisory given  Plan Discussed with:   Anesthesia Plan Comments:        Anesthesia Quick Evaluation

## 2021-06-16 ENCOUNTER — Ambulatory Visit (HOSPITAL_COMMUNITY)
Admission: RE | Admit: 2021-06-16 | Discharge: 2021-06-16 | Disposition: A | Discharge status: Home / Self Care | Payer: Medicare HMO | Attending: Surgery | Admitting: Surgery

## 2021-06-16 ENCOUNTER — Ambulatory Visit (HOSPITAL_COMMUNITY): Payer: Medicare HMO | Admitting: Physician Assistant

## 2021-06-16 ENCOUNTER — Other Ambulatory Visit: Payer: Self-pay

## 2021-06-16 ENCOUNTER — Encounter (HOSPITAL_COMMUNITY): Payer: Self-pay | Admitting: Surgery

## 2021-06-16 ENCOUNTER — Ambulatory Visit (HOSPITAL_COMMUNITY): Payer: Medicare HMO | Admitting: Certified Registered Nurse Anesthetist

## 2021-06-16 ENCOUNTER — Encounter (HOSPITAL_COMMUNITY)
Admission: RE | Disposition: A | Discharge status: Home / Self Care | Payer: Self-pay | Source: Home / Self Care | Attending: Surgery

## 2021-06-16 DIAGNOSIS — I130 Hypertensive heart and chronic kidney disease with heart failure and stage 1 through stage 4 chronic kidney disease, or unspecified chronic kidney disease: Secondary | ICD-10-CM | POA: Diagnosis not present

## 2021-06-16 DIAGNOSIS — Z87891 Personal history of nicotine dependence: Secondary | ICD-10-CM | POA: Diagnosis not present

## 2021-06-16 DIAGNOSIS — I2510 Atherosclerotic heart disease of native coronary artery without angina pectoris: Secondary | ICD-10-CM | POA: Diagnosis not present

## 2021-06-16 DIAGNOSIS — K4090 Unilateral inguinal hernia, without obstruction or gangrene, not specified as recurrent: Secondary | ICD-10-CM | POA: Diagnosis not present

## 2021-06-16 DIAGNOSIS — E119 Type 2 diabetes mellitus without complications: Secondary | ICD-10-CM | POA: Insufficient documentation

## 2021-06-16 DIAGNOSIS — N189 Chronic kidney disease, unspecified: Secondary | ICD-10-CM | POA: Diagnosis not present

## 2021-06-16 DIAGNOSIS — I5032 Chronic diastolic (congestive) heart failure: Secondary | ICD-10-CM | POA: Diagnosis not present

## 2021-06-16 DIAGNOSIS — E1122 Type 2 diabetes mellitus with diabetic chronic kidney disease: Secondary | ICD-10-CM | POA: Diagnosis not present

## 2021-06-16 DIAGNOSIS — I110 Hypertensive heart disease with heart failure: Secondary | ICD-10-CM | POA: Diagnosis not present

## 2021-06-16 DIAGNOSIS — Z9079 Acquired absence of other genital organ(s): Secondary | ICD-10-CM | POA: Insufficient documentation

## 2021-06-16 DIAGNOSIS — I509 Heart failure, unspecified: Secondary | ICD-10-CM | POA: Diagnosis not present

## 2021-06-16 DIAGNOSIS — I252 Old myocardial infarction: Secondary | ICD-10-CM | POA: Diagnosis not present

## 2021-06-16 HISTORY — PX: INGUINAL HERNIA REPAIR: SHX194

## 2021-06-16 LAB — GLUCOSE, CAPILLARY
Glucose-Capillary: 81 mg/dL (ref 70–99)
Glucose-Capillary: 95 mg/dL (ref 70–99)

## 2021-06-16 SURGERY — REPAIR, HERNIA, INGUINAL, LAPAROSCOPIC
Anesthesia: General | Laterality: Left

## 2021-06-16 MED ORDER — LIDOCAINE HCL (PF) 2 % IJ SOLN
INTRAMUSCULAR | Status: DC | PRN
  Administered 2021-06-16: 1 mg/kg/h via INTRADERMAL

## 2021-06-16 MED ORDER — CHLORHEXIDINE GLUCONATE CLOTH 2 % EX PADS: 6.0000 | MEDICATED_PAD | Freq: Once | CUTANEOUS | Status: DC

## 2021-06-16 MED ORDER — PROPOFOL 10 MG/ML IV BOLUS
INTRAVENOUS | Status: DC | PRN
Start: 2021-06-16 — End: 2021-06-16
  Administered 2021-06-16: 100 mg via INTRAVENOUS

## 2021-06-16 MED ORDER — ACETAMINOPHEN 500 MG PO TABS
1000.0000 mg | ORAL_TABLET | ORAL | Status: AC
  Administered 2021-06-16: 1000 mg via ORAL
  Filled 2021-06-16: qty 2

## 2021-06-16 MED ORDER — PROPOFOL 10 MG/ML IV BOLUS
INTRAVENOUS | Status: AC
  Filled 2021-06-16: qty 20

## 2021-06-16 MED ORDER — BUPIVACAINE LIPOSOME 1.3 % IJ SUSP
INTRAMUSCULAR | Status: DC | PRN
  Administered 2021-06-16: 20 mL

## 2021-06-16 MED ORDER — LIDOCAINE 2% (20 MG/ML) 5 ML SYRINGE
INTRAMUSCULAR | Status: DC | PRN
  Administered 2021-06-16: 60 mg via INTRAVENOUS

## 2021-06-16 MED ORDER — LIDOCAINE HCL (PF) 2 % IJ SOLN
INTRAMUSCULAR | Status: AC
  Filled 2021-06-16: qty 5

## 2021-06-16 MED ORDER — ROCURONIUM BROMIDE 10 MG/ML (PF) SYRINGE
PREFILLED_SYRINGE | INTRAVENOUS | Status: DC | PRN
  Administered 2021-06-16: 40 mg via INTRAVENOUS
  Administered 2021-06-16 (×3): 20 mg via INTRAVENOUS

## 2021-06-16 MED ORDER — LACTATED RINGERS IV SOLN: INTRAVENOUS | Status: DC

## 2021-06-16 MED ORDER — AMISULPRIDE (ANTIEMETIC) 5 MG/2ML IV SOLN: 10.0000 mg | Freq: Once | INTRAVENOUS | Status: DC | PRN

## 2021-06-16 MED ORDER — FENTANYL CITRATE (PF) 100 MCG/2ML IJ SOLN
INTRAMUSCULAR | Status: DC | PRN
  Administered 2021-06-16 (×2): 50 ug via INTRAVENOUS
  Administered 2021-06-16 (×2): 25 ug via INTRAVENOUS

## 2021-06-16 MED ORDER — SUGAMMADEX SODIUM 200 MG/2ML IV SOLN
INTRAVENOUS | Status: DC | PRN
Start: 2021-06-16 — End: 2021-06-16
  Administered 2021-06-16: 200 mg via INTRAVENOUS

## 2021-06-16 MED ORDER — EPHEDRINE SULFATE-NACL 50-0.9 MG/10ML-% IV SOSY
PREFILLED_SYRINGE | INTRAVENOUS | Status: DC | PRN
  Administered 2021-06-16: 10 mg via INTRAVENOUS
  Administered 2021-06-16: 15 mg via INTRAVENOUS

## 2021-06-16 MED ORDER — PHENYLEPHRINE HCL-NACL 20-0.9 MG/250ML-% IV SOLN
INTRAVENOUS | Status: DC | PRN
  Administered 2021-06-16: 50 ug/min via INTRAVENOUS

## 2021-06-16 MED ORDER — ONDANSETRON HCL 4 MG/2ML IJ SOLN
INTRAMUSCULAR | Status: AC
  Filled 2021-06-16: qty 2

## 2021-06-16 MED ORDER — FENTANYL CITRATE PF 50 MCG/ML IJ SOSY: 25.0000 ug | PREFILLED_SYRINGE | INTRAMUSCULAR | Status: DC | PRN

## 2021-06-16 MED ORDER — OXYCODONE-ACETAMINOPHEN 5-325 MG PO TABS
1.0000 | ORAL_TABLET | ORAL | 0 refills | Status: DC | PRN
Start: 2021-06-16 — End: 2021-09-29

## 2021-06-16 MED ORDER — FENTANYL CITRATE (PF) 100 MCG/2ML IJ SOLN
INTRAMUSCULAR | Status: AC
  Filled 2021-06-16: qty 2

## 2021-06-16 MED ORDER — BUPIVACAINE HCL 0.5 % IJ SOLN
INTRAMUSCULAR | Status: DC | PRN
  Administered 2021-06-16: 30 mL

## 2021-06-16 MED ORDER — BUPIVACAINE LIPOSOME 1.3 % IJ SUSP: 20.0000 mL | Freq: Once | INTRAMUSCULAR | Status: DC

## 2021-06-16 MED ORDER — BUPIVACAINE LIPOSOME 1.3 % IJ SUSP
INTRAMUSCULAR | Status: AC
  Filled 2021-06-16: qty 20

## 2021-06-16 MED ORDER — SODIUM CHLORIDE 0.9 % IV SOLN: INTRAVENOUS | Status: DC | PRN

## 2021-06-16 MED ORDER — DEXAMETHASONE SODIUM PHOSPHATE 10 MG/ML IJ SOLN
INTRAMUSCULAR | Status: DC | PRN
Start: 2021-06-16 — End: 2021-06-16
  Administered 2021-06-16: 6 mg via INTRAVENOUS

## 2021-06-16 MED ORDER — ONDANSETRON HCL 4 MG/2ML IJ SOLN
INTRAMUSCULAR | Status: DC | PRN
Start: 2021-06-16 — End: 2021-06-16
  Administered 2021-06-16: 4 mg via INTRAVENOUS

## 2021-06-16 MED ORDER — DEXAMETHASONE SODIUM PHOSPHATE 10 MG/ML IJ SOLN
INTRAMUSCULAR | Status: AC
  Filled 2021-06-16: qty 1

## 2021-06-16 MED ORDER — CEFAZOLIN SODIUM-DEXTROSE 2-4 GM/100ML-% IV SOLN
2.0000 g | INTRAVENOUS | Status: AC
  Administered 2021-06-16: 2 g via INTRAVENOUS
  Filled 2021-06-16: qty 100

## 2021-06-16 MED ORDER — LIDOCAINE HCL 2 % IJ SOLN
INTRAMUSCULAR | Status: AC
  Filled 2021-06-16: qty 20

## 2021-06-16 MED ORDER — PHENYLEPHRINE HCL-NACL 20-0.9 MG/250ML-% IV SOLN
INTRAVENOUS | Status: AC
  Filled 2021-06-16: qty 500

## 2021-06-16 MED ORDER — BUPIVACAINE-EPINEPHRINE (PF) 0.25% -1:200000 IJ SOLN
INTRAMUSCULAR | Status: AC
  Filled 2021-06-16: qty 30

## 2021-06-16 MED ORDER — BUPIVACAINE HCL (PF) 0.5 % IJ SOLN
INTRAMUSCULAR | Status: AC
  Filled 2021-06-16: qty 30

## 2021-06-16 MED ORDER — ORAL CARE MOUTH RINSE: 15.0000 mL | Freq: Once | OROMUCOSAL | Status: AC

## 2021-06-16 MED ORDER — CHLORHEXIDINE GLUCONATE 0.12 % MT SOLN
15.0000 mL | Freq: Once | OROMUCOSAL | Status: AC
  Administered 2021-06-16: 15 mL via OROMUCOSAL

## 2021-06-16 MED ORDER — ROCURONIUM BROMIDE 10 MG/ML (PF) SYRINGE
PREFILLED_SYRINGE | INTRAVENOUS | Status: AC
  Filled 2021-06-16: qty 10

## 2021-06-16 SURGICAL SUPPLY — 41 items
ADH SKN CLS APL DERMABOND .7 (GAUZE/BANDAGES/DRESSINGS) ×1
APL PRP STRL LF DISP 70% ISPRP (MISCELLANEOUS) ×1
BAG COUNTER SPONGE SURGICOUNT (BAG) ×3 IMPLANT
BAG SPNG CNTER NS LX DISP (BAG) ×1
CABLE HIGH FREQUENCY MONO STRZ (ELECTRODE) ×3 IMPLANT
CATH FOLEY 2WAY SLVR  5CC 14FR (CATHETERS) ×2
CATH FOLEY 2WAY SLVR 5CC 14FR (CATHETERS) IMPLANT
CHLORAPREP W/TINT 26 (MISCELLANEOUS) ×3 IMPLANT
DERMABOND ADVANCED (GAUZE/BANDAGES/DRESSINGS) ×1
DERMABOND ADVANCED .7 DNX12 (GAUZE/BANDAGES/DRESSINGS) ×2 IMPLANT
ELECT REM PT RETURN 15FT ADLT (MISCELLANEOUS) ×3 IMPLANT
GLOVE SURG ENC TEXT LTX SZ7.5 (GLOVE) ×3 IMPLANT
GLOVE SURG UNDER LTX SZ8 (GLOVE) ×3 IMPLANT
GOWN STRL REUS W/TWL XL LVL3 (GOWN DISPOSABLE) ×6 IMPLANT
GRASPER SUT TROCAR 14GX15 (MISCELLANEOUS) ×3 IMPLANT
HEMOSTAT ARISTA ABSORB 3G PWDR (HEMOSTASIS) ×1 IMPLANT
IRRIG SUCT STRYKERFLOW 2 WTIP (MISCELLANEOUS)
IRRIGATION SUCT STRKRFLW 2 WTP (MISCELLANEOUS) IMPLANT
KIT BASIN OR (CUSTOM PROCEDURE TRAY) ×3 IMPLANT
KIT TURNOVER KIT A (KITS) IMPLANT
MARKER SKIN DUAL TIP RULER LAB (MISCELLANEOUS) ×3 IMPLANT
MESH 3DMAX 5X7 LT XLRG (Mesh General) ×1 IMPLANT
NDL INSUFFLATION 14GA 120MM (NEEDLE) ×4 IMPLANT
NEEDLE INSUFFLATION 14GA 120MM (NEEDLE) ×4 IMPLANT
RELOAD STAPLE 4.0 BLU F/HERNIA (INSTRUMENTS) IMPLANT
RELOAD STAPLE 4.8 BLK F/HERNIA (STAPLE) ×2 IMPLANT
RELOAD STAPLE HERNIA 4.0 BLUE (INSTRUMENTS) ×2 IMPLANT
RELOAD STAPLE HERNIA 4.8 BLK (STAPLE) ×2 IMPLANT
SCISSORS LAP 5X35 DISP (ENDOMECHANICALS) ×3 IMPLANT
SET TUBE SMOKE EVAC HIGH FLOW (TUBING) ×3 IMPLANT
SPIKE FLUID TRANSFER (MISCELLANEOUS) IMPLANT
STAPLER HERNIA 12 8.5 360D (INSTRUMENTS) ×3 IMPLANT
SUT MNCRL AB 4-0 PS2 18 (SUTURE) ×3 IMPLANT
SUT NOVA NAB GS-21 0 18 T12 DT (SUTURE) ×1 IMPLANT
SUT VICRYL 0 UR6 27IN ABS (SUTURE) IMPLANT
TOWEL OR 17X26 10 PK STRL BLUE (TOWEL DISPOSABLE) ×3 IMPLANT
TRAY FOL W/BAG SLVR 16FR STRL (SET/KITS/TRAYS/PACK) ×2 IMPLANT
TRAY FOLEY W/BAG SLVR 16FR LF (SET/KITS/TRAYS/PACK) ×2
TRAY LAPAROSCOPIC (CUSTOM PROCEDURE TRAY) ×3 IMPLANT
TROCAR BLADELESS OPT 5 100 (ENDOMECHANICALS) ×6 IMPLANT
TROCAR XCEL 12X100 BLDLESS (ENDOMECHANICALS) ×3 IMPLANT

## 2021-06-16 NOTE — Op Note (Signed)
Patient: Corey [name] (23-Jun-1945, [id number])  Date of Surgery: 06/16/2021   Preoperative Diagnosis: LEFT INGUINAL HERNIA   Postoperative Diagnosis: LEFT INGUINAL HERNIA   Surgical Procedure: LAPAROSCOPIC, POSSIBLY OPEN LEFT INGUINAL HERNIA REPAIR WITH MESH: [id number]   Operative Team Members:  Surgeon(s) and Role:    * Berdell Nevitt, Nickola Major, MD - Primary   Anesthesiologist: Suzette Battiest, MD CRNA: Montel Clock, CRNA; [place] Pugh, CRNA   Anesthesia: General   Fluids:  Total I/O In: 100 [IV [id number]] Out: -   Complications: None  Drains:  None  Specimen: None  Disposition:  PACU - hemodynamically stable.  Plan of Care: Discharge to home after PACU  Indications for Procedure: Corey [name] is a 76 y.o. male who presented with a left inguinal hernia.  He had previous umbilical and epigastric hernia repair with intra-peritoneal onlay mesh and previous prostatectomy.  I recommended laparoscopic, possibly open inguinal hernia repair with mesh.  The procedure itself as well as its risks, benefits and alternatives were discussed and the patient granted consent to proceed.  Findings:  Technique: Transabdominal preperitoneal (TAPP) Hernia Location: Large left indirect inguinal hernia Mesh Size &Type:  Bard 3D Max left sided extra-large Mesh Fixation: Endo-Universal hernia stapler  Infection status: Patient: Private Patient Elective Case Case: Elective Infection Present At Time Of Surgery (PATOS): None   Description of Procedure:  The patient was positioned supine, padded and secured to the bed, with both arms tucked.  The abdomen was widely prepped and draped.  A time out procedure was performed.    An incision was made in the left upper quadrant to avoid the previous IPOM mesh and a 12mm trocar was inserted into the abdomen which was insufflated to 20mmHg.  The previous IPOM mesh was intact.  It ended just below the umbilicus.  There were no adhesion to the  mesh.  I decided to proceed with laparoscopic repair.   A 1 cm supra umbilical incision was made.  An incision was made through the mesh using an 11 blade.  The 19mm trocar was inserted through this dilated the hole in the mesh.  A final 33mm trocar was placed in the right abdomen.  There was no trauma to the underlying viscera.  There was an indirect hernia on the LEFT.  Utilizing a transabdominal pre peritoneal technique (TAPP), a horizontal incision was made in the peritoneum, immediately below the umbilicus, just below the IPOM mesh.  Dissection was carried out in the pre peritoneal space down to the level of the hernia sac which was reduced into the peritoneal cavity completely.  The cord contents were parietalized and preserved.  A large pre peritoneal dissection was performed to uncover the direct, indirect, femoral and obturator spaces.  Coopers ligament was uncovered medially and the psoas muscle uncovered laterally.  Arista was sprayed into the plane of dissection to help with hemostasis.  The mesh, as documented above, was opened and advanced into the pre peritoneal position so that it more than adequately covered the indirect, direct, femoral and obturator spaces.  The mesh laid flat, with no inferior folds and covered the entire myopectineal orifice.  The mesh was fixated with the endo-universal hernia stapler to Coopers ligament and the posterior aspect of the rectus muscle.  The peritoneal flap was closed with the same device.  There were no peritoneal defects or exposed mesh at the conclusion.  The umbilical trocar was removed and the fascial defect was closed with a figure of 8  0-novafil suture to bring the hole in the mesh back together.  The peritoneal cavity was completely desufflated, the trocars removed and the skin closed with 4-0 Monocryl subcuticular suture and skin glue.  All sponge and needle counts were correct at the end of the case.  Corey Raw, MD General, Bariatric, &  Minimally Invasive Surgery [facility] Surgery, [place]

## 2021-06-16 NOTE — Discharge Instructions (Signed)
 GROIN HERNIA REPAIR POST OPERATIVE INSTRUCTIONS  Thinking Clearly  The anesthesia may cause you to feel different for 1 or 2 days. Do not drive, drink alcohol, or make any big decisions for at least 2 days.  Nutrition When you wake up, you will be able to drink small amounts of liquid. If you do not feel sick, you can slowly advance your diet to regular foods. Continue to drink lots of fluids, usually about 8 to 10 glasses per day. Eat a high-fiber diet so you don't strain during bowel movements. High-Fiber Foods Foods high in fiber include beans, bran cereals and whole-grain breads, peas, dried fruit (figs, apricots, and dates), raspberries, blackberries, strawberries, sweet corn, broccoli, baked potatoes with skin, plums, pears, apples, greens, and nuts. Activity Slowly increase your activity. Be sure to get up and walk every hour or so to prevent blood clots. No heavy lifting or strenuous activity for 4 weeks following surgery to prevent hernias at your incision sites or recurrence of your hernia. It is normal to feel tired. You may need more sleep than usual.  Get your rest but make sure to get up and move around frequently to prevent blood clots and pneumonia.  Work and Return to School You can go back to work when you feel well enough. Discuss the timing with your surgeon. You can usually go back to school or work 1 week or less after an laparoscopic or an open repair. If your work requires heavy lifting or strenuous activity you need to be placed on light duty for 4 weeks following surgery. You can return to gym class, sports or other physical activities 4 weeks after surgery.  Wound Care You may experience significant bruising in the groin including into the scrotum in males.  Rest, elevating the groin and scrotum above the level of the heart, ice and compression with tight fitting underwear can help.  Always wash your hands before and after touching near your incision site. Do  not soak in a bathtub until cleared at your follow up appointment. You may take a shower 24 hours after surgery. A small amount of drainage from the incision is normal. If the drainage is thick and yellow or the site is red, you may have an infection, so call your surgeon. If you have a drain in one of your incisions, it will be taken out in office when the drainage stops. Steri-Strips will fall off in 7 to 10 days or they will be removed during your first office visit. If you have dermabond glue covering over the incision, allow the glue to flake off on its own. Protect the new skin, especially from the sun. The sun can burn and cause darker scarring. Your scar will heal in about 4 to 6 weeks and will become softer and continue to fade over the next year.  The cosmetic appearance of the incisions will improve over the course of the first year after surgery. Sensation around your incision will return in a few weeks or months.  Bowel Movements After intestinal surgery, you may have loose watery stools for several days. If watery diarrhea lasts longer than 3 days, contact your surgeon. Pain medication (narcotics) can cause constipation. Increase the fiber in your diet with high-fiber foods if you are constipated. You can take an over the counter stool softener like Colace to avoid constipation.  Additional over the counter medications can also be used if Colace isn't sufficient (for example, Milk of Magnesia or Miralax).    Pain The amount of pain is different for each person. Some people need only 1 to 3 doses of pain control medication, while others need more. Take alternating doses of tylenol and ibuprofen around the clock for the first five days following surgery.  This will provide a baseline of pain control and help with inflammation.  Take the narcotic pain medication in addition if needed for severe pain.  Contact Your Surgeon at [phone], if you have: Pain that will not go away Pain that  gets worse A fever of more than 101F (38.3C) Repeated vomiting Swelling, redness, bleeding, or bad-smelling drainage from your wound site Strong abdominal pain No bowel movement or unable to pass gas for 3 days Watery diarrhea lasting longer than 3 days  Pain Control The goal of pain control is to minimize pain, keep you moving and help you heal. Your surgical team will work with you on your pain plan. Most often a combination of therapies and medications are used to control your pain. You may also be given medication (local anesthetic) at the surgical site. This may help control your pain for several days. Extreme pain puts extra stress on your body at a time when your body needs to focus on healing. Do not wait until your pain has reached a level "10" or is unbearable before telling your doctor or nurse. It is much easier to control pain before it becomes severe. Following a laparoscopic procedure, pain is sometimes felt in the shoulder. This is due to the gas inserted into your abdomen during the procedure. Moving and walking helps to decrease the gas and the right shoulder pain.  Use the guide below for ways to manage your post-operative pain. Learn more by going to facs.org/safepaincontrol.  How Intense Is My Pain Common Therapies to Feel Better       I hardly notice my pain, and it does not interfere with my activities.  I notice my pain and it distracts me, but I can still do activities (sitting up, walking, standing).  Non-Medication Therapies  Ice (in a bag, applied over clothing at the surgical site), elevation, rest, meditation, massage, distraction (music, TV, play) walking and mild exercise Splinting the abdomen with pillows +  Non-Opioid Medications Acetaminophen (Tylenol) Non-steroidal anti-inflammatory drugs (NSAIDS) Aspirin, Ibuprofen (Motrin, Advil) Naproxen (Aleve) Take these as needed, when you feel pain. Both acetaminophen and NSAIDs help to decrease pain  and swelling (inflammation).      My pain is hard to ignore and is more noticeable even when I rest.  My pain interferes with my usual activities.  Non-Medication Therapies  +  Non-Opioid medications  Take on a regular schedule (around-the-clock) instead of as needed. (For example, Tylenol every 6 hours at 9:00 am, 3:00 pm, 9:00 pm, 3:00 am and Motrin every 6 hours at 12:00 am, 6:00 am, 12:00 pm, 6:00 pm)         I am focused on my pain, and I am not doing my daily activities.  I am groaning in pain, and I cannot sleep. I am unable to do anything.  My pain is as bad as it could be, and nothing else matters.  Non-Medication Therapies  +  Around-the-Clock Non-Opioid Medications  +  Short-acting opioids  Opioids should be used with other medications to manage severe pain. Opioids block pain and give a feeling of euphoria (feel high). Addiction, a serious side effect of opioids, is rare with short-term (a few days) use.  Examples of short-acting opioids   include: Tramadol (Ultram), Hydrocodone (Norco, Vicodin), Hydromorphone (Dilaudid), Oxycodone (Oxycontin)     The above directions have been adapted from the American College [employer] Surgical Patient Education Program.  Please refer to the ACS website if needed: [email or web address]://[email or web address]/-/media/files/[email or web address]   Samyra Limb, MD [facility] [address], [address], [place], [place]  [address] ?  [address] [address], [place], [place]   [address] [phone] ? [phone] ? FAX [phone] Web site: [email or web address]  

## 2021-06-16 NOTE — Transfer of Care (Signed)
Immediate Anesthesia Transfer of Care Note  Patient: Corey [name]  Procedure(s) Performed: LAPAROSCOPIC, POSSIBLY OPEN LEFT INGUINAL HERNIA REPAIR WITH MESH (Left)  Patient Location: PACU  Anesthesia Type:General  Level of Consciousness: awake, drowsy and patient cooperative  Airway & Oxygen Therapy: Patient Spontanous Breathing and Patient connected to face mask oxygen  Post-op Assessment: Report given to RN and Post -op Vital signs reviewed and stable  Post vital signs: Reviewed and stable  Last Vitals:  Vitals Value Taken Time  BP 127/95 06/16/21 0905  Temp    Pulse 50 06/16/21 0907  Resp 11 06/16/21 0907  SpO2 100 % 06/16/21 0907  Vitals shown include unvalidated device data.  Last Pain:  Vitals:   06/16/21 0641  TempSrc: Oral  PainSc:       Patients Stated Pain Goal: 4 (06/89/34 0684)  Complications: No notable events documented.

## 2021-06-16 NOTE — Anesthesia Postprocedure Evaluation (Signed)
Anesthesia Post Note  Patient: DANTAE MEUNIER  Procedure(s) Performed: LAPAROSCOPIC, POSSIBLY OPEN LEFT INGUINAL HERNIA REPAIR WITH MESH (Left)     Patient location during evaluation: PACU Anesthesia Type: General Level of consciousness: awake and alert Pain management: pain level controlled Vital Signs Assessment: post-procedure vital signs reviewed and stable Respiratory status: spontaneous breathing, nonlabored ventilation, respiratory function stable and patient connected to nasal cannula oxygen Cardiovascular status: blood pressure returned to baseline and stable Postop Assessment: no apparent nausea or vomiting Anesthetic complications: no   No notable events documented.  Last Vitals:  Vitals:   06/16/21 0930 06/16/21 0945  BP: (!) 131/94 (!) 130/92  Pulse: (!) 51 (!) 56  Resp: 12 16  Temp: 36.4 C 36.4 C  SpO2: 99% 94%    Last Pain:  Vitals:   06/16/21 0945  TempSrc:   PainSc: 0-No pain                 Tiajuana Amass

## 2021-06-16 NOTE — H&P (Signed)
Admitting Physician: Nickola Major Kia Stavros  Service: General surgery  CC: hernia  Subjective   HPI: Corey [name] is an 76 y.o. male who is here for elective left inguinal hernia repair  Past Medical History:  Diagnosis Date   Cataract    CHF (congestive heart failure) ([place])    Chronic heart failure with preserved ejection fraction (HFpEF) ([place])    Colon polyp 2003   Dr [name]; F/U was to be 2008( not completed)   CVA (cerebral infarction) 2014   Diabetes mellitus    Diverticulosis 2003   Dyspnea    with exertion   Gout    Hypertension    Myocardial infarction ([place]) 2014   ?? PEA cardiac arrest   OSA (obstructive sleep apnea) 03/27/2018   Pneumonia 09/29/2011   Avelox X 10 days as OP   Prostate cancer ([place])    Renal insufficiency    Seizures ([place])    not treated for seizure disorder; had a seizure after stroke 2014; no seizure since then    Past Surgical History:  Procedure Laterality Date   COLONOSCOPY W/ POLYPECTOMY  2003   no F/U ([place] discussed 12/03/12)   INSERTION OF MESH N/A 07/16/2017   Procedure: INSERTION OF MESH;  Surgeon: [name], MD;  Location: [facility];  Service: General;  Laterality: N/A;   PEG PLACEMENT  2014   PEG TUBE REMOVAL  2014   PROSTATE BIOPSY     RIGHT/LEFT HEART CATH AND CORONARY ANGIOGRAPHY N/A 09/20/2020   Procedure: RIGHT/LEFT HEART CATH AND CORONARY ANGIOGRAPHY;  Surgeon: Leonie Man, MD;  Location: [facility];  Service: Cardiovascular;  Laterality: N/A;   TEE WITHOUT CARDIOVERSION N/A 09/27/2020   Procedure: TRANSESOPHAGEAL ECHOCARDIOGRAM (TEE);  Surgeon: Wonda Olds, MD;  Location: [facility] OR;  Service: Thoracic;  Laterality: N/A;   TRACHEOSTOMY  2014   was closed after hospital stay   VENTRAL HERNIA REPAIR N/A 07/16/2017   Procedure: [place];  Surgeon: [name], MD;  Location: [facility] OR;  Service: General;  Laterality: N/A;   wrist aspiration  02/16/12    monosodium urate  crystals; Dr Caralyn Guile    Family History  Problem Relation Age of Onset   Heart failure Mother    Hypertension Mother    Diabetes Mother    Hypertension Father    Prostate cancer Father    Diabetes Father    Cancer Father    Heart failure Brother    Stroke Neg Hx    Heart disease Neg Hx    Colon cancer Neg Hx     Social:  reports that he quit smoking about 53 years ago. His smoking use included cigarettes. He has a 0.25 pack-year smoking history. He has never used smokeless tobacco. He reports that he does not drink alcohol and does not use drugs.  Allergies:  Allergies  Allergen Reactions   Hydrochlorothiazide Other (See Comments)    Gout , uncontrolled diabetes and renal insufficiency    Medications: Current Outpatient Medications  Medication Instructions   ACCU-CHEK AVIVA PLUS test strip TEST BLOOD SUGAR TWICE DAILY AS DIRECTED   ACCU-CHEK AVIVA PLUS test strip Use as instructed   albuterol (VENTOLIN HFA) 108 (90 Base) MCG/ACT inhaler 2 puffs, Inhalation, Every 4 hours PRN   Alcohol Swabs (DROPSAFE ALCOHOL PREP) 70 % PADS USE 4 (FOUR) TIMES DAILY   allopurinol (ZYLOPRIM) 100 MG tablet Take 1/2 (one-half) tablet by mouth once daily  aspirin EC 81 mg, Oral, Daily   bismuth subsalicylate (PEPTO BISMOL) 262 MG/15ML suspension 30 mLs, Oral, Every 6 hours PRN   carvedilol (COREG) 6.25 mg, Oral, 2 times daily with meals   colchicine (COLCRYS) 0.6 MG tablet Take 2 tabs once and then one tab one hour later as needed for gout   glipiZIDE (GLUCOTROL XL) 2.5 mg, Oral, Daily with breakfast   hydrALAZINE (APRESOLINE) 100 mg, Oral, 3 times daily   isosorbide mononitrate (IMDUR) 120 mg, Oral, Daily   Jardiance 10 mg, Oral, Every morning   nitroGLYCERIN (NITROSTAT) 0.4 mg, Sublingual, Every 5 min PRN   rosuvastatin (CRESTOR) 40 mg, Oral, Daily   torsemide (DEMADEX) 20 mg, Oral, 2 times weekly, Mondays and Fridays   TRUEplus Lancets 33G MISC USE 4 (FOUR) TIMES DAILY   Vyndamax 61 mg,  Oral, Daily    ROS - all of the below systems have been reviewed with the patient and positives are indicated with bold text General: chills, fever or night sweats Eyes: blurry vision or double vision ENT: epistaxis or sore throat Allergy/Immunology: itchy/watery eyes or nasal congestion Hematologic/Lymphatic: bleeding problems, blood clots or swollen lymph nodes Endocrine: temperature intolerance or unexpected weight changes Breast: new or changing breast lumps or nipple discharge Resp: cough, shortness of breath, or wheezing CV: chest pain or dyspnea on exertion GI: as per HPI GU: dysuria, trouble voiding, or hematuria MSK: joint pain or joint stiffness Neuro: TIA or stroke symptoms Derm: pruritus and skin lesion changes Psych: anxiety and depression  Objective   PE Blood pressure 125/85, pulse (!) 55, temperature 98 F (36.7 C), temperature source Oral, resp. rate 18, height 5\' 6"  (1.676 m), weight 75.1 kg, SpO2 100 %. Constitutional: NAD; conversant; no deformities Eyes: Moist conjunctiva; no lid lag; anicteric; PERRL Neck: Trachea midline; no thyromegaly Lungs: Normal respiratory effort; no tactile fremitus CV: RRR; no palpable thrills; no pitting edema GI: Abd left inguinal hernia; no palpable hepatosplenomegaly MSK: Normal range of motion of extremities; no clubbing/cyanosis Psychiatric: Appropriate affect; alert and oriented x3 Lymphatic: No palpable cervical or axillary lymphadenopathy  Results for orders placed or performed during the hospital encounter of 06/16/21 (from the past 24 hour(s))  Glucose, capillary     Status: None   Collection Time: 06/16/21  5:50 AM  Result Value Ref Range   Glucose-Capillary 81 70 - 99 mg/dL   Comment 1 Notify RN    Comment 2 Document in Chart     Imaging Orders  No imaging studies ordered today     Assessment and Plan   Corey [name] is an 76 y.o. male with a left inguinal hernia.  I recommended laparoscopic, possibly open  repair.  The procedure itself as well as its risks, benefits and alternatives were discussed and the patient granted consent to proceed.  We will proceed as scheduled.  Felicie Morn, MD  [facility] Use [email or web address] to contact on call provider

## 2021-06-16 NOTE — Anesthesia Procedure Notes (Signed)
Procedure Name: Intubation Date/Time: 06/16/2021 7:31 AM Performed by: West Pugh, CRNA Pre-anesthesia Checklist: Patient identified, Emergency Drugs available, Suction available, Patient being monitored and Timeout performed Patient Re-evaluated:Patient Re-evaluated prior to induction Oxygen Delivery Method: Circle system utilized Preoxygenation: Pre-oxygenation with 100% oxygen Induction Type: IV induction Ventilation: Mask ventilation without difficulty Laryngoscope Size: 3 and Glidescope Grade View: Grade I Tube type: Oral Number of attempts: 2 Airway Equipment and Method: Rigid stylet and Video-laryngoscopy Placement Confirmation: ETT inserted through vocal cords under direct vision, positive ETCO2, CO2 detector and breath sounds checked- equal and bilateral Secured at: 22 cm Tube secured with: Tape Dental Injury: Teeth and Oropharynx as per pre-operative assessment  Difficulty Due To: Difficulty was anticipated, Difficult Airway- due to reduced neck mobility and Difficult Airway- due to anterior larynx Comments: DL x 1, no view, no attempt made. Very stiff neck. Glidescope utilized with grade 1 view and successful intubation.

## 2021-06-17 ENCOUNTER — Encounter (HOSPITAL_COMMUNITY): Payer: Self-pay | Admitting: Surgery

## 2021-06-20 ENCOUNTER — Other Ambulatory Visit: Payer: Self-pay | Admitting: Internal Medicine

## 2021-06-22 ENCOUNTER — Ambulatory Visit (INDEPENDENT_AMBULATORY_CARE_PROVIDER_SITE_OTHER): Payer: Medicare HMO | Admitting: *Deleted

## 2021-06-22 DIAGNOSIS — I5040 Unspecified combined systolic (congestive) and diastolic (congestive) heart failure: Secondary | ICD-10-CM

## 2021-06-22 DIAGNOSIS — N189 Chronic kidney disease, unspecified: Secondary | ICD-10-CM

## 2021-06-22 DIAGNOSIS — E1165 Type 2 diabetes mellitus with hyperglycemia: Secondary | ICD-10-CM

## 2021-06-22 NOTE — Patient Instructions (Signed)
Visit Information  Katie and [name], thank you for taking time to talk with me today. Please don't hesitate to contact me if I can be of assistance to you before our next scheduled telephone appointment.  Below are the goals we discussed today:  Patient Self-Care Activities: Patient Corey [name] will: Take medications as prescribed Attend all scheduled provider appointments Call pharmacy for medication refills Call provider office for new concerns or questions Continue to check fasting (first thing in the morning, before eating) blood sugars at home: the blood sugars you reported today are all in very good range Continue to check after-meal blood sugars each day, 2 hours after eating a regular size meal: the blood sugars you reported today are all in very good range Continue to follow heart healthy, low salt, low cholesterol, carbohydrate-modified, low sugar diet Continue to monitor and record weights at home each day: Today you reported a weight of 160 lb-- this is right in line with the previous weights you have reported Call office if I gain more than 2 pounds in one day or 5 pounds in one week, especially if this is associated with chest tightness, swelling in your legs/ ankles, and new or worsened shortness of breath Watch for swelling in feet, ankles and legs every day: tell your cardiologist that you are taking the diuretic (fluid pill) 4 times a week instead of 3 times a week- they may want to check your kidney function labs Keep legs up while sitting, and continue walking for exercise when the weather is nice outside Keep limiting the salt in your diet: please decrease your salt intake and do not add salt to your meals Keep up the great work preventing falls  Our next scheduled telephone follow up visit/ appointment with care management team member is scheduled on:   Monday, August 15, 2021 at 3:00 pm- This is a PHONE [place] appointment  If you need to cancel or re-schedule our visit, please call  [phone] and our care guide team will be happy to assist you.   I look forward to hearing about your progress.   [name], RN, BSN, [place] [phone]: direct office  If you are experiencing a Mental Health or [place] or need someone to talk to, please  call the Suicide and Crisis Lifeline: 988 call the [place] National Suicide Prevention Lifeline: 6578218944 or TTY: 857-209-5682 TTY (281) 201-7491) to talk to a trained counselor call 1-800-273-TALK (toll free, 24 hour hotline) go to [facility] [address], [place] [phone]) call 911   Patient verbalizes understanding of instructions and care plan provided today and agrees to view in [place]. Active MyChart status confirmed with patient    Chronic Kidney Disease, Adult Chronic kidney disease (CKD) occurs when the kidneys are slowly and permanently damaged over a long period of time. The kidneys are a pair of organs that do many important jobs in the body, including: Removing waste and extra fluid from the blood to make urine. Making hormones that maintain the amount of fluid in tissues and blood vessels. Maintaining the right amount of fluids and chemicals in the body. A small amount of kidney damage may not cause problems, but a large amount of damage may make it hard or impossible for the kidneys to work right. Steps must be taken to slow kidney damage or to stop it from getting worse. If steps are not taken, the kidneys may  stop working permanently (end-stage renal disease, or ESRD). Most of the time, CKD does not go away, but it can often be controlled. People who have CKD are usually able to live full lives. What are the causes? The most common causes of this condition are diabetes and high blood pressure (hypertension). Other causes include: Cardiovascular diseases. These affect the heart and  blood vessels. Kidney diseases. These include: Glomerulonephritis, or inflammation of the tiny filters in the kidneys. Interstitial nephritis. This is swelling of the small tubes of the kidneys and of the surrounding structures. Polycystic kidney disease, in which clusters of fluid-filled sacs form within the kidneys. Renal vascular disease. This includes disorders that affect the arteries and veins of the kidneys. Diseases that affect the body's defense system (immune system). A problem with urine flow. This may be caused by: Kidney stones. Cancer. An enlarged prostate, in males. A kidney infection or urinary tract infection (UTI) that keeps coming back. Vasculitis. This is swelling or inflammation of the blood vessels. What increases the risk? Your chances of having kidney disease increase with age. The following factors may make you more likely to develop this condition: A family history of kidney disease or kidney failure. Kidney failure means the kidneys can no longer work right. Certain genetic diseases. Taking medicines often that are damaging to the kidneys. Being around or being in contact with toxic substances. Obesity. A history of tobacco use. What are the signs or symptoms? Symptoms of this condition include: Feeling very tired (lethargic) and having less energy. Swelling, or edema, of the face, legs, ankles, or feet. Nausea or vomiting, or loss of appetite. Confusion or trouble concentrating. Muscle twitches and cramps, especially in the legs. Dry, itchy skin. A metallic taste in the mouth. Producing less urine, or producing more urine (especially at night). Shortness of breath. Trouble sleeping. CKD may also result in not having enough red blood cells or hemoglobin in the blood (anemia) or having weak bones (bone disease). Symptoms develop slowly and may not be obvious until the kidney damage becomes severe. It is possible to have kidney disease for years without  having symptoms. How is this diagnosed? This condition may be diagnosed based on: Blood tests. Urine tests. Imaging tests, such as an ultrasound or a CT scan. A kidney biopsy. This involves removing a sample of kidney tissue to be looked at under a microscope. Results from these tests will help to determine how serious the CKD is. How is this treated? There is no cure for most cases of this condition, but treatment usually relieves symptoms and prevents or slows the worsening of the disease. Treatment may include: Diet changes, which may require you to avoid alcohol and foods that are high in salt, potassium, phosphorous, and protein. Medicines. These may: Lower blood pressure. Control blood sugar (glucose). Relieve anemia. Relieve swelling. Protect your bones. Improve the balance of salts and minerals in your blood (electrolytes). Dialysis, which is a type of treatment that removes toxic waste from the body. It may be needed if you have kidney failure. Managing any other conditions that are causing your CKD or making it worse. Follow these instructions at home: Medicines Take over-the-counter and prescription medicines only as told by your health care provider. The amount of some medicines that you take may need to be changed. Do not take any new medicines unless approved by your health care provider. Many medicines can make kidney damage worse. Do not take any vitamin and mineral supplements unless approved by  your health care provider. Many nutritional supplements can make kidney damage worse. Lifestyle  Do not use any products that contain nicotine or tobacco, such as cigarettes, e-cigarettes, and chewing tobacco. If you need help quitting, ask your health care provider. If you drink alcohol: Limit how much you use to: 0-1 drink a day for women who are not pregnant. 0-2 drinks a day for men. Know how much alcohol is in your drink. In the [place] one drink equals one 12 oz bottle of  beer (355 mL), one 5 oz glass of wine (148 mL), or one 1 oz glass of hard liquor (44 mL). Maintain a healthy weight. If you need help, ask your health care provider. General instructions  Follow instructions from your health care provider about eating or drinking restrictions, including any prescribed diet. Track your blood pressure at home. Report changes in your blood pressure as told. If you are being treated for diabetes, track your blood glucose levels as told. Start or continue an exercise plan. Exercise at least 30 minutes a day, 5 days a week. Keep your immunizations up to date as told. Keep all follow-up visits. This is important. Where to find more information American Association of Kidney Patients: BombTimer.gl National Kidney Foundation: www.kidney.[place]: https://mathis.com/ Life Options: www.lifeoptions.org Kidney School: www.kidneyschool.org Contact a health care provider if: Your symptoms get worse. You develop new symptoms. Get help right away if: You develop symptoms of ESRD. These include: Headaches. Numbness in your hands or feet. Easy bruising. Frequent hiccups. Chest pain. Shortness of breath. Lack of menstrual periods, in women. You have a fever. You are producing less urine than usual. You have pain or bleeding when you urinate or when you have a bowel movement. These symptoms may represent a serious problem that is an emergency. Do not wait to see if the symptoms will go away. Get medical help right away. Call your local emergency services (911 in the [place] Do not drive yourself to the hospital. Summary Chronic kidney disease (CKD) occurs when the kidneys become damaged slowly over a long period of time. The most common causes of this condition are diabetes and high blood pressure (hypertension). There is no cure for most cases of CKD, but treatment usually relieves symptoms and prevents or slows the worsening of the disease. Treatment may  include a combination of lifestyle changes, medicines, and dialysis. This information is not intended to replace advice given to you by your health care provider. Make sure you discuss any questions you have with your health care provider. Document Revised: 08/06/2019 Document Reviewed: 08/06/2019 Elsevier Patient Education  [place].

## 2021-06-22 NOTE — Chronic Care Management (AMB) (Signed)
Chronic Care Management   CCM RN Visit Note  06/22/2021 Name: Corey [name] MRN: [id number] DOB: Jan 19, 1946  Subjective: Corey [name] is a 76 y.o. year old male who is a primary care patient of Burns, [name], MD. The care management team was consulted for assistance with disease management and care coordination needs.    Engaged with patient by telephone for follow up visit in response to provider referral for case management and/or care coordination services.   Consent to Services:  The patient was given information about Chronic Care Management services, agreed to services, and gave verbal consent prior to initiation of services.  Please see initial visit note for detailed documentation.  Patient agreed to services and verbal consent obtained.   Assessment: Review of patient past medical history, allergies, medications, health status, including review of consultants reports, laboratory and other test data, was performed as part of comprehensive evaluation and provision of chronic care management services.   CCM Care Plan Allergies  Allergen Reactions   Hydrochlorothiazide Other (See Comments)    Gout , uncontrolled diabetes and renal insufficiency   Outpatient Encounter Medications as of 06/22/2021  Medication Sig Note   Tafamidis (VYNDAMAX) 61 MG CAPS Take 61 mg by mouth daily. 06/07/2021: New medication awaiting arrival from [facility] test strip TEST BLOOD SUGAR TWICE DAILY AS DIRECTED    ACCU-CHEK AVIVA PLUS test strip Use as instructed    albuterol (VENTOLIN HFA) 108 (90 Base) MCG/ACT inhaler Inhale 2 puffs into the lungs every 4 (four) hours as needed for wheezing or shortness of breath.    Alcohol Swabs (DROPSAFE ALCOHOL PREP) 70 % PADS USE 4 (FOUR) TIMES DAILY    allopurinol (ZYLOPRIM) 100 MG tablet Take 1/2 (one-half) tablet by mouth once daily    aspirin EC 81 MG tablet Take 1 tablet (81 mg total) by mouth daily.    bismuth subsalicylate (PEPTO  BISMOL) 262 MG/15ML suspension Take 30 mLs by mouth every 6 (six) hours as needed for indigestion.    carvedilol (COREG) 6.25 MG tablet Take 1 tablet (6.25 mg total) by mouth 2 (two) times daily with a meal.    colchicine (COLCRYS) 0.6 MG tablet Take 2 tabs once and then one tab one hour later as needed for gout    glipiZIDE (GLUCOTROL XL) 2.5 MG 24 hr tablet Take 1 tablet (2.5 mg total) by mouth daily with breakfast.    hydrALAZINE (APRESOLINE) 50 MG tablet Take 2 tablets (100 mg total) by mouth 3 (three) times daily.    isosorbide mononitrate (IMDUR) 60 MG 24 hr tablet Take 2 tablets (120 mg total) by mouth daily.    JARDIANCE 10 MG TABS tablet Take 10 mg by mouth every morning.    nitroGLYCERIN (NITROSTAT) 0.4 MG SL tablet Place 0.4 mg under the tongue every 5 (five) minutes as needed for chest pain.    oxyCODONE-acetaminophen (PERCOCET) 5-325 MG tablet Take 1 tablet by mouth every 4 (four) hours as needed for severe pain.    rosuvastatin (CRESTOR) 40 MG tablet Take 1 tablet (40 mg total) by mouth daily.    torsemide (DEMADEX) 20 MG tablet Take 1 tablet (20 mg total) by mouth 2 (two) times a week. Mondays and Fridays (Patient taking differently: Take 20 mg by mouth every Monday, Wednesday, and Friday.)    TRUEplus Lancets 33G MISC USE 4 (FOUR) TIMES DAILY (Patient not taking: Reported on 05/10/2021)    No facility-administered encounter medications on file as  of 06/22/2021.   Patient Active Problem List   Diagnosis Date Noted   Preoperative cardiovascular examination 04/04/2021   HFrEF (heart failure with reduced ejection fraction) ([place]) 04/03/2021   Bilateral hearing loss 01/04/2021   Bilateral impacted cerumen 01/04/2021   Tympanic membrane perforation, left 01/04/2021   Physical deconditioning 10/16/2020   Hereditary cardiac amyloidosis ([place]) 09/30/2020   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   Progressive angina ([place]) - Atypical; DOE 09/20/2020   CAD (coronary artery disease)  09/20/2020   Acute on chronic combined systolic and diastolic CHF (congestive heart failure) ([place]) 09/20/2020   Acute combined systolic and diastolic heart failure ([place]) 09/20/2020   Soft tissue mass 07/30/2020   Contracture of joint of finger of left hand 07/30/2020   Acute respiratory disease due to COVID-19 virus 06/05/2019   DOE (dyspnea on exertion) 03/01/2019   Mild cognitive impairment 12/05/2018   Slurred speech 07/23/2018   Muscle twitching 07/23/2018   OSA (obstructive sleep apnea) 03/27/2018   Snores 01/21/2018   Poor balance 01/23/2017   Ankle swelling, left 09/06/2016   Bradycardia 01/28/2016   Weak urinary stream 12/31/2015   Stage 3b chronic kidney disease 09/15/2015   Ventral hernia without obstruction or gangrene 09/15/2015   Prostate cancer ([place]) 07/25/2015   Erectile dysfunction 05/04/2015   Urinary frequency 05/04/2015   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   Abnormal finding on MRI of brain 02/14/2015   PEA (Pulseless electrical activity) ([place]) 12/03/2012   Diabetes ([place]) 06/30/2011   Gout 03/05/2007   Essential hypertension 03/05/2007   Conditions to be addressed/monitored:  CHF, DMII, and CKD Stage III  Care Plan : RN Care Manager Plan of Care  Updates made by Knox Royalty, RN since 06/22/2021 12:00 AM     Problem: Chronic Disease Management Needs   Priority: High     Long-Range Goal: Ongoing adherence to established plan of care for long term chronic disease management   Start Date: 05/12/2021  Expected End Date: 05/12/2022  Priority: High  Note:   Current Barriers:  Chronic Disease Management support and education needs related to CHF and DMII Unable to independently manage medications: reports decreased vision due to glaucoma; wife and son assists in medication management Limited health literacy, hard of hearing in (L) ear: wife participates in all of patient medical affairs and is very supportive/ helpful Challenges  following low salt diet  RNCM Clinical Goal(s):  Patient will demonstrate ongoing health management independence as evidenced by DMII; CHF/ CKD- III        through collaboration with RN Care manager, provider, and care team.   Interventions: 1:1 collaboration with primary care provider regarding development and update of comprehensive plan of care as evidenced by provider attestation and co-signature Inter-disciplinary care team collaboration (see longitudinal plan of care) Evaluation of current treatment plan related to  self management and patient's adherence to plan as established by provider Reviewed recent elective hernia surgery 06/17/21 with patient-- states he is doing great and denies concerns, complications post-surgery Pain assessment updated: patient denies acute/ chronic pain today; states he only had to take "2 doses" of pain medication after his recent elective hernia repair surgery Falls assessment updated: patient continues to deny falls- does not use assistive devices; positive reinforcement provided with encouragement to continue efforts Confirmed no medication concerns; family assists in preparing medications, due to patient's limited vision Reviewed upcoming provider appointments and confirmed patient is aware of all and has plans to attend as scheduled: 07/05/21- cardiology;  08/12/21- endocrinology provider; 10/24/21- PCP office visit; wife also reports upcoming renal provider office "soon;" states that renal provider contacted them to schedule follow up appointment-- "they saw something on his kidney" and she is awaiting call-back from provider office staff; encouraged her to call renal provider herself if she does not hear from office staff to schedule; she verbalizes understanding/ agreement  Heart Failure Interventions:  (Status: 06/22/21- Goal on Track (progressing): YES.)  Long Term Goal  Wt Readings from Last 3 Encounters:  06/16/21 165 lb 9.6 oz (75.1 kg)  06/10/21 165 lb  9.6 oz (75.1 kg)  06/06/21 160 lb (72.6 kg)  Basic overview and discussion of pathophysiology of Heart Failure reviewed Discussed importance of daily weight and advised patient to weigh and record daily Reviewed role of diuretics in prevention of fluid overload and management of heart failure Discussed the importance of keeping all appointments with provider Advised patient to discuss recent lab values for kidney function-- and to inform cardiologist that patient has started taking diuretic 4 times a week instead of 3 times a week with provider Discussed current  clinical condition with patient/ caregiver and confirmed no current clinical or medication concerns; confirmed patient's spouse or his son continues to manage medications for patient due to decreased vision Confirmed continues to perform daily weight monitoring/ recording at home: reports today weight of "160 lbs" which is generally in line with recently reported weight ranges between 160-164 lbs; continues to deny overnight weight gain > 3 lbs/ weekly > 5 lbs: action plan for weight gain or development of signs/ symptoms yellow CHF zone reinforced Reinforced previously provided education around signs/ symptoms early fluid retention along with action plan for same-- patient and wife continue to require ongoing reinforcement Reports lower extremity swelling "at baseline;" "not bad;" "the same" and confirms continues taking diuretic M-W-F: wife reports she has been providing "extra" diuretic to patient on Saturday's recently due to swelling-- I encouraged her to make cardiology provider aware of same; we discussed role of diuretic in setting of concurrent CKD-III and need to closely monitor kidney function Reports patient received and is taking recently prescribed experimental medication for amyloidosis- denies having side effects/ untoward effects; wife verbalizes that she has contact information for specialty pharmacy and understands to call for  refill when patient has only 10 days  Confirmed patient continues to use CPAP at night: reports upcoming cardiology visit for new CPAP mask fitting  Diabetes:  (Status: 06/22/21- Goal on Track (progressing): YES.) Long Term Goal   Lab Results  Component Value Date   HGBA1C 7.2 (H) 05/06/2021  Provided education to patient about basic DM disease process; Discussed plans with patient for ongoing care management follow up and provided patient with direct contact information for care management team;      Review of patient status, including review of consultants reports, relevant laboratory and other test results, and medications completed;       Assessed patient's understanding of meaning/ significance of A1-C values: ongoing fair baseline understanding of same- requires ongoing reinforcement/ reminding; Reviewed individual historical A1-C trends and provided education around correlation of A1-C value to blood sugar levels at home over 3 months Reviewed recent blood sugars at home with patient- he reports consistent fasting values "between 100-130;" and post-prandial ranges between "78-90;" -- positive reinforcement provided with encouragement to continue efforts Discussed signs/ symptoms hypoglycemia: patient aware of same; education reinforced around action plan; today, he tells me that even though his blood sugars are "on the  low side at night" he is not/ has not had recent development of signs/ symptoms hypoglycemia Confirmed patient continues to "try to eat right right;" positive reinforcement provided with encouragement to continue efforts  Patient Goals/Self-Care Activities: As evidenced by review of EHR, collaboration with care team, and patient reporting during CCM RN CM outreach,  Patient Corey [name] will: Take medications as prescribed Attend all scheduled provider appointments Call pharmacy for medication refills Call provider office for new concerns or questions Continue to check fasting  (first thing in the morning, before eating) blood sugars at home: the blood sugars you reported today are all in very good range Continue to check after-meal blood sugars each day, 2 hours after eating a regular size meal: the blood sugars you reported today are all in very good range Continue to follow heart healthy, low salt, low cholesterol, carbohydrate-modified, low sugar diet Continue to monitor and record weights at home each day: Today you reported a weight of 160 lb-- this is right in line with the previous weights you have reported Call office if I gain more than 2 pounds in one day or 5 pounds in one week, especially if this is associated with chest tightness, swelling in your legs/ ankles, and new or worsened shortness of breath Watch for swelling in feet, ankles and legs every day: tell your cardiologist that you are taking the diuretic (fluid pill) 4 times a week instead of 3 times a week- they may want to check your kidney function labs Keep legs up while sitting, and continue walking for exercise when the weather is nice outside Keep limiting the salt in your diet: please decrease your salt intake and do not add salt to your meals Keep up the great work preventing falls    Plan: Telephone follow up appointment with care management team member scheduled for:  Monday, August 15, 2021 at 3:00 pm The patient has been provided with contact information for the care management team and has been advised to call with any health related questions or concerns  [name], RN, BSN, [place] [phone]: direct office

## 2021-06-29 ENCOUNTER — Ambulatory Visit: Payer: Medicare HMO

## 2021-06-29 DIAGNOSIS — I5040 Unspecified combined systolic (congestive) and diastolic (congestive) heart failure: Secondary | ICD-10-CM

## 2021-06-29 DIAGNOSIS — E1122 Type 2 diabetes mellitus with diabetic chronic kidney disease: Secondary | ICD-10-CM

## 2021-06-29 DIAGNOSIS — N189 Chronic kidney disease, unspecified: Secondary | ICD-10-CM

## 2021-06-29 NOTE — Progress Notes (Signed)
Chronic Care Management Pharmacy Note  06/29/2021 Name:  Corey [name] MRN:  [id number] DOB:  19-Jan-1946  Summary: -Patient reports that he has started glipizide XL 2.82m in place of glimepiride - reports that BG since starting has been averaging 91-95 in the AM, and 120-125 in the PM - denies any issues with hypoglycemia -Has been able to start Vyndamax as he has received via PAP - denies any issues since starting  - Has been monitoring BP - averaging 120/80  -Confirms adherence to all of his medications, denies any current issues or concerns   Recommendations/Changes made from today's visit: -Recommending no changes to medications, patient to continue to monitor BG and BP at least once daily, will reach out should either become uncontrolled   Subjective: Corey DUNSTANis an 76y.o. year old male who is a primary patient of Burns, SClaudina Lick MD.  The CCM team was consulted for assistance with disease management and care coordination needs.    Engaged with patient by telephone for follow up visit in response to provider referral for pharmacy case management and/or care coordination services.   Consent to Services:  The patient was given information about Chronic Care Management services, agreed to services, and gave verbal consent prior to initiation of services.  Please see initial visit note for detailed documentation.   Patient Care Team: BBinnie Rail MD as PCP - General (Internal Medicine) SJerline Pain MD as PCP - Cardiology (Cardiology) FCharlton Haws [facility] Pharmacist (Pharmacist) TKnox Royalty RN as Case Manager  Recent office visits: None since last visit   Recent consult visits: 05/10/2021 - Dr. KDwyane Dee- Endocrinology - stop amaryl and start glipizide ER 2.578mdaily -f/u in 3-4 months   Hospital visits: 06/16/2021- Hernia repair surgery    Objective:  Lab Results  Component Value Date   CREATININE 2.19 (H) 06/10/2021   BUN 42 (H) 06/10/2021   GFR 24.83  (L) 05/06/2021   GFRNONAA 31 (L) 06/10/2021   GFRAA 46 (L) 01/28/2020   NA 142 06/10/2021   K 4.2 06/10/2021   CALCIUM 9.8 06/10/2021   CO2 27 06/10/2021   GLUCOSE 108 (H) 06/10/2021    Lab Results  Component Value Date/Time   HGBA1C 7.2 (H) 05/06/2021 10:04 AM   HGBA1C 6.8 (H) 02/07/2021 09:23 AM   FRUCTOSAMINE 338 (H) 01/03/2021 08:41 AM   FRUCTOSAMINE 287 (H) 03/01/2020 09:09 AM   GFR 24.83 (L) 05/06/2021 10:04 AM   GFR 31.34 (L) 02/07/2021 09:23 AM   MICROALBUR 8.5 (H) 11/23/2020 10:56 AM   MICROALBUR 2.4 (H) 03/03/2020 11:09 AM    Last diabetic Eye exam:  Lab Results  Component Value Date/Time   HMDIABEYEEXA No Retinopathy 05/27/2018 12:00 AM    Last diabetic Foot exam: No results found for: HMDIABFOOTEX   Lab Results  Component Value Date   CHOL 88 11/23/2020   HDL 41.90 11/23/2020   LDLCALC 34 11/23/2020   TRIG 64.0 11/23/2020   CHOLHDL 2 11/23/2020    Hepatic Function Latest Ref Rng & Units 02/07/2021 11/23/2020 09/23/2020  Total Protein 6.0 - 8.3 g/dL 6.5 7.1 6.2(L)  Albumin 3.5 - 5.2 g/dL 3.7 4.1 3.3(L)  AST 0 - 37 U/L 45(H) 35 25  ALT 0 - 53 U/L 51 20 16  Alk Phosphatase 39 - 117 U/L 90 64 67  Total Bilirubin 0.2 - 1.2 mg/dL 0.7 0.6 1.3(H)  Bilirubin, Direct 0.0 - 0.3 mg/dL - - -  Lab Results  Component Value Date/Time   TSH 1.520 09/27/2020 08:46 AM   TSH 0.243 (L) 06/06/2019 03:43 AM   TSH 1.13 01/23/2017 11:48 AM   TSH 1.06 12/03/2012 11:43 AM   FREET4 1.02 09/27/2020 08:46 AM   FREET4 0.82 06/06/2019 03:43 AM    CBC Latest Ref Rng & Units 06/10/2021 09/29/2020 09/28/2020  WBC 4.0 - 10.5 K/uL 2.6(L) 8.2 9.8  Hemoglobin 13.0 - 17.0 g/dL 13.4 13.6 13.4  Hematocrit 39.0 - 52.0 % 43.2 44.0 42.4  Platelets 150 - 400 K/uL 127(L) 218 223    No results found for: VD25OH  Clinical ASCVD: Yes  The ASCVD Risk score (Arnett DK, et al., 2019) failed to calculate for the following reasons:   The patient has a prior MI or stroke diagnosis    Depression  screen Corey [name] 2/9 11/04/2020 07/30/2020 06/12/2019  Decreased Interest 0 0 0  Down, Depressed, Hopeless 0 0 0  PHQ - 2 Score 0 0 0  Some recent data might be hidden    Social History   Tobacco Use  Smoking Status Former   Packs/day: 0.25   Years: 1.00   Pack years: 0.25   Types: Cigarettes   Quit date: 05/15/1968   Years since quitting: 53.1  Smokeless Tobacco Never  Tobacco Comments   smoked [place], up to 1-2 cigarettes/ day   BP Readings from Last 3 Encounters:  06/16/21 (!) 130/92  06/10/21 (!) 116/112  05/10/21 140/62   Pulse Readings from Last 3 Encounters:  06/16/21 (!) 56  06/10/21 (!) 55  05/10/21 (!) 53   Wt Readings from Last 3 Encounters:  06/16/21 165 lb 9.6 oz (75.1 kg)  06/10/21 165 lb 9.6 oz (75.1 kg)  06/06/21 160 lb (72.6 kg)   BMI Readings from Last 3 Encounters:  06/16/21 26.73 kg/m  06/10/21 26.73 kg/m  06/06/21 25.82 kg/m    Assessment/Interventions: Review of patient past medical history, allergies, medications, health status, including review of consultants reports, laboratory and other test data, was performed as part of comprehensive evaluation and provision of chronic care management services.   Corey:  (Social Determinants of Health) assessments and interventions performed: Yes  Corey [name]   Alcohol Screen: Not on file  Depression (PHQ2-9): Low Risk    PHQ-2 Score: 0  Financial Resource Strain: Medium Risk   Difficulty of Paying Living Expenses: Somewhat hard  Food Insecurity: No Food Insecurity   Worried About Charity fundraiser in the Last Year: Never true   Ran Out of Food in the Last Year: Never true  Housing: Low Risk    Last Housing Risk Score: 0  Physical Activity: Not on file  Social Connections: Not on file  Stress: Not on file  Tobacco Use: Medium Risk   Smoking Tobacco Use: Former   Smokeless Tobacco Use: Never   Passive Exposure: Not on file  Transportation Needs: No Transportation Needs   Lack of  Transportation (Medical): No   Lack of Transportation (Non-Medical): No    CCM Care Plan  Allergies  Allergen Reactions   Hydrochlorothiazide Other (See Comments)    Gout , uncontrolled diabetes and renal insufficiency    Medications Reviewed Today     Reviewed by Tomasa Blase, [name] (Pharmacist) on 06/29/21 at 1108  Med List Status: <None>   Medication Order Taking? Sig Documenting Provider Last Dose Status Informant  ACCU-CHEK AVIVA PLUS test strip [id number] Yes TEST BLOOD SUGAR TWICE DAILY AS DIRECTED Elayne Snare, MD Taking  Active Spouse/Significant Other  ACCU-CHEK AVIVA PLUS test strip [id number] Yes Use as instructed Elayne Snare, MD Taking Active Spouse/Significant Other  albuterol (VENTOLIN HFA) 108 (90 Base) MCG/ACT inhaler [id number] Yes Inhale 2 puffs into the lungs every 4 (four) hours as needed for wheezing or shortness of breath. Janith Lima, MD Taking Active Spouse/Significant Other  Alcohol Swabs (DROPSAFE ALCOHOL PREP) 70 % PADS [id number] Yes USE 4 (FOUR) TIMES DAILY Elayne Snare, MD Taking Active Spouse/Significant Other  allopurinol (ZYLOPRIM) 100 MG tablet [id number] Yes Take 1/2 (one-half) tablet by mouth once daily [name], MD Taking Active Spouse/Significant Other  aspirin EC 81 MG tablet [id number] Yes Take 1 tablet (81 mg total) by mouth daily. [name], MD Taking Active Spouse/Significant Other  bismuth subsalicylate (PEPTO BISMOL) 262 MG/15ML suspension [id number] Yes Take 30 mLs by mouth every 6 (six) hours as needed for indigestion. [provider] Taking Active Spouse/Significant Other  carvedilol (COREG) 6.25 MG tablet [id number] Yes Take 1 tablet (6.25 mg total) by mouth 2 (two) times daily with a meal. Burns, [name], MD Taking Active Spouse/Significant Other  colchicine (COLCRYS) 0.6 MG tablet [id number] Yes Take 2 tabs once and then one tab one hour later as needed for gout Quay Burow, [name], MD Taking Active Spouse/Significant Other   glipiZIDE (GLUCOTROL XL) 2.5 MG 24 hr tablet [id number] Yes Take 1 tablet (2.5 mg total) by mouth daily with breakfast. Elayne Snare, MD Taking Active Spouse/Significant Other  hydrALAZINE (APRESOLINE) 50 MG tablet [id number] Yes Take 2 tablets (100 mg total) by mouth 3 (three) times daily. Kroeger, Lorelee Cover., PA-C Taking Active Spouse/Significant Other  isosorbide mononitrate (IMDUR) 60 MG 24 hr tablet [id number] Yes Take 2 tablets (120 mg total) by mouth daily. Abigail Butts., PA-C Taking Active Spouse/Significant Other  JARDIANCE 10 MG TABS tablet [id number] Yes Take 10 mg by mouth every morning. [provider] Taking Active Spouse/Significant Other  nitroGLYCERIN (NITROSTAT) 0.4 MG SL tablet [id number] Yes Place 0.4 mg under the tongue every 5 (five) minutes as needed for chest pain. [provider] Taking Active Spouse/Significant Other  oxyCODONE-acetaminophen (PERCOCET) 5-325 MG tablet [id number] Yes Take 1 tablet by mouth every 4 (four) hours as needed for severe pain. Stechschulte, Nickola Major, MD Taking Active   rosuvastatin (CRESTOR) 40 MG tablet [id number] Yes Take 1 tablet (40 mg total) by mouth daily. Abigail Butts., PA-C Taking Active Spouse/Significant Other  Tafamidis (VYNDAMAX) 61 MG CAPS [id number] Yes Take 61 mg by mouth daily. Geralynn Rile, MD Taking Active Spouse/Significant Other           Med Note Kenton Kingfisher, Earley Favor   Tue Jun 07, 2021  2:28 PM) New medication awaiting arrival from specialty pharmacy  torsemide (DEMADEX) 20 MG tablet [id number] Yes Take 1 tablet (20 mg total) by mouth 2 (two) times a week. Mondays and Fridays  Patient taking differently: Take 20 mg by mouth every Monday, Wednesday, and Friday.   Katherine Roan, MD Taking Active Spouse/Significant Other           Med Note Tori Milks Apr 04, 2021 11:28 AM)    TRUEplus Lancets 33G MISC [id number] Yes USE 4 (FOUR) TIMES DAILY Elayne Snare, MD Taking Active Spouse/Significant  Other            Patient Active Problem List   Diagnosis Date Noted   Preoperative cardiovascular examination 04/04/2021   HFrEF (heart failure with reduced ejection fraction) ([place]) 04/03/2021   Bilateral  hearing loss 01/04/2021   Bilateral impacted cerumen 01/04/2021   Tympanic membrane perforation, left 01/04/2021   Physical deconditioning 10/16/2020   Hereditary cardiac amyloidosis ([place]) 09/30/2020   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   Progressive angina (HCC) - Atypical; DOE 09/20/2020   CAD (coronary artery disease) 09/20/2020   Acute on chronic combined systolic and diastolic CHF (congestive heart failure) ([place]) 09/20/2020   Acute combined systolic and diastolic heart failure ([place]) 09/20/2020   Soft tissue mass 07/30/2020   Contracture of joint of finger of left hand 07/30/2020   Acute respiratory disease due to COVID-19 virus 06/05/2019   DOE (dyspnea on exertion) 03/01/2019   Mild cognitive impairment 12/05/2018   Slurred speech 07/23/2018   Muscle twitching 07/23/2018   OSA (obstructive sleep apnea) 03/27/2018   Snores 01/21/2018   Poor balance 01/23/2017   Ankle swelling, left 09/06/2016   Bradycardia 01/28/2016   Weak urinary stream 12/31/2015   Stage 3b chronic kidney disease 09/15/2015   Ventral hernia without obstruction or gangrene 09/15/2015   Prostate cancer ([place]) 07/25/2015   Erectile dysfunction 05/04/2015   Urinary frequency 05/04/2015   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   Abnormal finding on MRI of brain 02/14/2015   PEA (Pulseless electrical activity) ([place]) 12/03/2012   Diabetes ([place]) 06/30/2011   Gout 03/05/2007   Essential hypertension 03/05/2007    Immunization History  Administered Date(s) Administered   Fluad Quad(high Dose 65+) 03/05/2019, 01/28/2020, 02/08/2021   Influenza Split 03/02/2011   Influenza Whole 01/23/2013   Influenza, High Dose Seasonal PF 01/28/2016, 01/23/2017, 01/21/2018    Influenza,inj,Quad PF,6+ Mos 02/23/2014, 02/24/2015   PFIZER(Purple Top)SARS-COV-2 Vaccination 07/08/2019, 07/29/2019, 02/07/2020   Pneumococcal Conjugate-13 05/04/2015   Pneumococcal Polysaccharide-23 10/02/2012, 01/21/2018   Tdap 12/15/2010, 09/21/2011    Conditions to be addressed/monitored:  Hypertension, Hyperlipidemia, Diabetes, Heart Failure, Coronary Artery Disease, Chronic Kidney Disease, and Gout  Care Plan : [facility]  Updates made by Tomasa Blase, RPH since 06/29/2021 12:00 AM     Problem: Hypertension, Hyperlipidemia, Diabetes, Heart Failure, Coronary Artery Disease, Chronic Kidney Disease, and Gout   Priority: High     Long-Range Goal: Disease management   Start Date: 11/16/2020  Expected End Date: 04/28/2022  This Visit's Progress: On track  Recent Progress: On track  Priority: High  Note:   Current Barriers:  Unable to independently monitor therapeutic efficacy  Pharmacist Clinical Goal(s):  Patient will achieve adherence to monitoring guidelines and medication adherence to achieve therapeutic efficacy through collaboration with PharmD and provider.   Interventions: 1:1 collaboration with [name], MD regarding development and update of comprehensive plan of care as evidenced by provider attestation and co-signature Inter-disciplinary care team collaboration (see longitudinal plan of care) Comprehensive medication review performed; medication list updated in electronic medical record   Hypertension / Heart Failure   Controlled  BP goal is:  <130/80 Patient checks BP at home daily Patient home BP readings are ranging: 120/80's - controlled at home per patient  Weight: 167 lbs yesterday (dry weight ~158 lbs)   Patient has failed these meds in the past: n/a Patient is currently controlled on the following medications: Hydralazine 50 mg - 2 tab TID Carvedilol 6.25 mg BID Vyndamax 22m daily  Torsemide 20 mg twice a week (Mon,  Fri) Isosorbide MN 1229mdaily  Jardiance 10 mg daily   We discussed: Pt reports weight has been  stable lately - averaging 157-160lbs  -GFR is low ~31; pt has been seen  by nephrology last month - no changes to medications per patient (note not available at this time) -Discussed importance of limiting salt in diet -Recommending for patient to continue current medications at this time     Hyperlipidemia / CAD  Controlled  LDL goal < 70 Hx stroke 2011; CAD 09/2020 Lab Results  Component Value Date   LDLCALC 34 11/23/2020  Patient has failed these meds in past: n/a Patient is currently controlled on the following medications: Aspirin 81 mg daily Rosuvastatin 40 mg daily Nitroglycerin 0.4 mg SL prn   We discussed:  pt reports compliance with rosuvastatin, started in hospital after anginal episode 09/2020. LDL is at goal   Plan: Recommend to continue current medication, continue to monitor kidney function - should CrCl <62m/min  - would recommend for change from rosuvastatin to atorvastatin    Diabetes  Controlled  A1c goal <7% Lab Results  Component Value Date   HGBA1C 7.2 (H) 05/06/2021  Checking BG: 2x per Day Recent FBG Readings: 90-95 in the AM, 120-125 in the PM   Patient has failed these meds in past: glimepiride, pioglitazone Patient is currently controlled on the following medications: Jardiance 140mdaily Glipizide XL 2.44m52maily   We discussed: Pt reports that he has been doing well since switching to glipizide XL 2.44mg49mily - BG well controlled - no issues with low blood sugars    Plan: Recommend to continue current medication   Gout   Controlled  Acute gout flare? - none since last visit  Patient has failed these meds in past: n/a Patient is currently controlled on the following medications: Colchicine 0.6 mg PRN Allopurinol 50 mg daily (100 mg 1/2 tab)   We discussed:  Pt reports he has not had any issues with gout since last appointment - has not had to  use colchicine  - patient to continue current dose of allopurinol   Patient Goals/Self-Care Activities Patient will:  - take medications as prescribed -focus on medication adherence by pill box -check glucose twice daily  -check blood pressure daily -weigh daily, take an extra torsemide dose if weight increase 2+ lbs overnight or 5+ lbs in a week -engage in dietary modifications by reducing salt      Care Gaps: Shingrix Eye exam (due 05/28/19) Covid booster (due 05/08/20)  Patient's preferred pharmacy is:  [facility] [place] - [address] [place]) [place] [phone]: [phone]: [id number]: [phone]: [phone] [place]w [facility] [place]: [phone]: [phone] pill box? Yes Pt endorses 100% compliance  We discussed: Current pharmacy is preferred with insurance plan and patient is satisfied with pharmacy services Patient decided to: Continue current medication management strategy  Care Plan and Follow Up Patient Decision:  Patient agrees to Care Plan and Follow-up.  Plan: Telephone follow up appointment with care management team member scheduled for:  6 months  DaniTomasa BlasearmD Clinical Pharmacist, [place]

## 2021-06-29 NOTE — Patient Instructions (Signed)
Visit Information  Following are the goals we discussed today:   Manage My Medicine   Timeframe:  Long-Range Goal Priority:  High Start Date:      11/16/20                       Expected End Date: 02/17/2022                     Follow Up Date 12/27/2021   - call for medicine refill 2 or 3 days before it runs out - call if I am sick and can't take my medicine - keep a list of all the medicines I take; vitamins and herbals too - use a pillbox to sort medicine -check blood pressure daily -weigh daily, take an extra torsemide dose if weight increase 2+ lbs overnight or 5+ lbs in a week -engage in dietary modifications by reducing salt  Plan: Telephone follow up appointment with care management team member scheduled for:  6 months  The patient has been provided with contact information for the care management team and has been advised to call with any health related questions or concerns.   Tomasa Blase, PharmD Clinical Pharmacist, [facility]    Please call the care guide team at [phone] if you need to cancel or reschedule your appointment.   Patient verbalizes understanding of instructions and care plan provided today and agrees to view in [place]. Active MyChart status confirmed with patient.

## 2021-07-05 ENCOUNTER — Ambulatory Visit: Payer: Medicare HMO | Admitting: Cardiology

## 2021-07-05 ENCOUNTER — Other Ambulatory Visit: Payer: Self-pay

## 2021-07-05 DIAGNOSIS — Z9889 Other specified postprocedural states: Secondary | ICD-10-CM

## 2021-07-05 DIAGNOSIS — E854 Organ-limited amyloidosis: Secondary | ICD-10-CM | POA: Diagnosis not present

## 2021-07-05 DIAGNOSIS — I5020 Unspecified systolic (congestive) heart failure: Secondary | ICD-10-CM

## 2021-07-05 DIAGNOSIS — I43 Cardiomyopathy in diseases classified elsewhere: Secondary | ICD-10-CM | POA: Diagnosis not present

## 2021-07-05 DIAGNOSIS — N1832 Chronic kidney disease, stage 3b: Secondary | ICD-10-CM | POA: Diagnosis not present

## 2021-07-05 DIAGNOSIS — E7800 Pure hypercholesterolemia, unspecified: Secondary | ICD-10-CM | POA: Diagnosis not present

## 2021-07-05 NOTE — Assessment & Plan Note (Signed)
Currently on Crestor 40 mg daily.  LDL 34.  Excellent.  No myalgias.

## 2021-07-05 NOTE — Assessment & Plan Note (Signed)
Repeat echo showed no residual effusion.

## 2021-07-05 NOTE — Assessment & Plan Note (Signed)
We will have an upcoming nephrology appointment referred by Dr. Quay Burow.

## 2021-07-05 NOTE — Assessment & Plan Note (Addendum)
Pericardial biopsy positive for amyloidosis PYP scan equivocal Genetic screening positive for ATTR amyloid On tafamidis.  Reviewed lab work.  Renal function noted.  Appreciate Dr. Kathalene Frames assistance

## 2021-07-05 NOTE — Progress Notes (Signed)
Cardiology Office Note:    Date:  07/05/2021   ID:  Corey [name], DOB 11-17-1945, MRN [id number]  PCP:  [name], MD   [facility] Providers Cardiologist:  Candee Furbish, MD     Referring MD: [name], MD    History of Present Illness:    Corey [name] is a 76 y.o. male here for follow-up of cardiac amyloidosis on tafamidis with prior pericardial effusion status post window, CAD combined systolic and diastolic heart failure with prior PEA arrest hypertension hyperlipidemia diabetes type 2 stroke obstructive sleep apnea cognitive impairment and prostate cancer.  Back in May 2022 was having dyspnea on exertion and possible anginal symptoms.  He scheduled for a left and right heart cath.  During that admission he was found to have an 80% occlusion of his LAD with severely elevated left ventricular end-diastolic pressures.  Symptoms were probably more compatible with pressure volume overload and he was started on diuretics.  His echocardiogram on 09/2020 showed EF of 35 to 40%.  Had a large pericardial effusion at the time.  He underwent a pericardial window and biopsies were taken that were compatible with amyloidosis possible AL amyloid low doses.  However, serum and urine studies were not compatible with AL amyloidosis.  He then underwent PYP scan that was equivocal.  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  Family history showed 2 brothers with heart failure 1 deceased 1 sister died from heart failure.  He has a son that is 68 years old without any issues.  He lost a significant amount of weight with diuresis.  His torsemide was decreased.  Weight has been steady at 156 pounds at last office visit with heart failure team.  PYP scan was delayed because of radiotracer shortage.  He underwent elective L inguinal hernia repair 06/16/21.  Today, he is accompanied by his wife and doing well. His breathing has been improving.   He saw Dr. Audie Box for amyloidosis  and started on tafamadis. He has been taking it for the past 3 weeks and tolerating it well.   His wife notices his bilateral LE will occasionally swell. His L ankle tends to swell more than his R ankle. She will give him an extra Lasix when she notices the swelling.   He denies any palpitations, chest pain, or shortness of breath, lightheadedness, headaches, syncope, orthopnea, PND, or exertional symptoms.  Past Medical History:  Diagnosis Date   Cataract    CHF (congestive heart failure) ([place])    Chronic heart failure with preserved ejection fraction (HFpEF) ([place])    Colon polyp 2003   Dr [name]; F/U was to be 2008( not completed)   CVA (cerebral infarction) 2014   Diabetes mellitus    Diverticulosis 2003   Dyspnea    with exertion   Gout    Hypertension    Myocardial infarction ([place]) 2014   ?? PEA cardiac arrest   OSA (obstructive sleep apnea) 03/27/2018   Pneumonia 09/29/2011   Avelox X 10 days as OP   Prostate cancer ([place])    Renal insufficiency    Seizures ([place])    not treated for seizure disorder; had a seizure after stroke 2014; no seizure since then    Past Surgical History:  Procedure Laterality Date   COLONOSCOPY W/ POLYPECTOMY  2003   no F/U ([place] discussed 12/03/12)   INGUINAL HERNIA REPAIR Left 06/16/2021   Procedure: LAPAROSCOPIC, POSSIBLY OPEN LEFT INGUINAL HERNIA REPAIR  WITH MESH;  Surgeon: Felicie Morn, MD;  Location: [facility];  Service: General;  Laterality: Left;   INSERTION OF MESH N/A 07/16/2017   Procedure: INSERTION OF MESH;  Surgeon: [name], MD;  Location: [facility];  Service: General;  Laterality: N/A;   PEG PLACEMENT  2014   PEG TUBE REMOVAL  2014   PROSTATE BIOPSY     RIGHT/LEFT HEART CATH AND CORONARY ANGIOGRAPHY N/A 09/20/2020   Procedure: RIGHT/LEFT HEART CATH AND CORONARY ANGIOGRAPHY;  Surgeon: Leonie Man, MD;  Location: [place];  Service: Cardiovascular;  Laterality: N/A;   TEE WITHOUT CARDIOVERSION N/A 09/27/2020    Procedure: TRANSESOPHAGEAL ECHOCARDIOGRAM (TEE);  Surgeon: Wonda Olds, MD;  [facility];  Service: Thoracic;  Laterality: N/A;   TRACHEOSTOMY  2014   was closed after hospital stay   [place] N/A 07/16/2017   Procedure: [place];  Surgeon: [name], MD;  Location: [facility];  Service: General;  Laterality: N/A;   wrist aspiration  02/16/12    monosodium urate crystals; Dr Caralyn Guile    Current Medications: Current Meds  Medication Sig   ACCU-CHEK AVIVA PLUS test strip TEST BLOOD SUGAR TWICE DAILY AS DIRECTED   ACCU-CHEK AVIVA PLUS test strip Use as instructed   albuterol (VENTOLIN HFA) 108 (90 Base) MCG/ACT inhaler Inhale 2 puffs into the lungs every 4 (four) hours as needed for wheezing or shortness of breath.   Alcohol Swabs (DROPSAFE ALCOHOL PREP) 70 % PADS USE 4 (FOUR) TIMES DAILY   allopurinol (ZYLOPRIM) 100 MG tablet Take 1/2 (one-half) tablet by mouth once daily   aspirin EC 81 MG tablet Take 1 tablet (81 mg total) by mouth daily.   bismuth subsalicylate (PEPTO BISMOL) 262 MG/15ML suspension Take 30 mLs by mouth every 6 (six) hours as needed for indigestion.   carvedilol (COREG) 6.25 MG tablet Take 1 tablet (6.25 mg total) by mouth 2 (two) times daily with a meal.   colchicine (COLCRYS) 0.6 MG tablet Take 2 tabs once and then one tab one hour later as needed for gout   glipiZIDE (GLUCOTROL XL) 2.5 MG 24 hr tablet Take 1 tablet (2.5 mg total) by mouth daily with breakfast.   hydrALAZINE (APRESOLINE) 50 MG tablet Take 2 tablets (100 mg total) by mouth 3 (three) times daily.   isosorbide mononitrate (IMDUR) 60 MG 24 hr tablet Take 2 tablets (120 mg total) by mouth daily.   JARDIANCE 10 MG TABS tablet Take 10 mg by mouth every morning.   nitroGLYCERIN (NITROSTAT) 0.4 MG SL tablet Place 0.4 mg under the tongue every 5 (five) minutes as needed for chest pain.   oxyCODONE-acetaminophen (PERCOCET) 5-325 MG tablet Take 1 tablet by  mouth every 4 (four) hours as needed for severe pain.   rosuvastatin (CRESTOR) 40 MG tablet Take 1 tablet (40 mg total) by mouth daily.   Tafamidis (VYNDAMAX) 61 MG CAPS Take 61 mg by mouth daily.   torsemide (DEMADEX) 20 MG tablet Take 1 tablet (20 mg total) by mouth 2 (two) times a week. Mondays and Fridays (Patient taking differently: Take 20 mg by mouth every Monday, Wednesday, and Friday.)   TRUEplus Lancets 33G MISC USE 4 (FOUR) TIMES DAILY     Allergies:   Hydrochlorothiazide   Social History   Socioeconomic History   Marital status: Married    Spouse name: [name]   Number of children: 2   Years of education: 58  Highest education level: 12th grade  Occupational History   Not on file  Tobacco Use   Smoking status: Former    Packs/day: 0.25    Years: 1.00    Pack years: 0.25    Types: Cigarettes    Quit date: 05/15/1968    Years since quitting: 53.1   Smokeless tobacco: Never   Tobacco comments:    smoked [place], up to 1-2 cigarettes/ day  Vaping Use   Vaping Use: Never used  Substance and Sexual Activity   Alcohol use: No    Alcohol/week: 0.0 standard drinks   Drug use: No   Sexual activity: Yes  Other Topics Concern   Not on file  Social History Narrative   Walks three times a week, 2-3 miles, occasional walks on treadmill       Patient is right-handed. He lives with his wife in a one level home. He drinks 1 diet Mt. Dew a day and rarely has coffee. He walks 3 x a week for exercise.   Social Determinants of Health   Financial Resource Strain: Medium Risk   Difficulty of Paying Living Expenses: Somewhat hard  Food Insecurity: No Food Insecurity   Worried About Charity fundraiser in the Last Year: Never true   Ran Out of Food in the Last Year: Never true  Transportation Needs: No Transportation Needs   Lack of Transportation (Medical): No   Lack of Transportation (Non-Medical): No  Physical Activity: Not on file  Stress: Not on file  Social  Connections: Not on file     Family History: The patient's family history includes Cancer in his father; Diabetes in his father and mother; Heart failure in his brother and mother; Hypertension in his father and mother; Prostate cancer in his father. There is no history of Stroke, Heart disease, or Colon cancer.  ROS:   Please see the history of present illness.   (+) LE edema   All other systems reviewed and are negative.  EKGs/Labs/Other Studies Reviewed:    Myocardial Stress Test 12/31/20 Findings are equivocal for TTR amyloidosis.  Visual score is 1 and H/CLL ratio is 1.4.    Echo 09/30/20 1. Trace pericardial fluid posterior and around right AV groove. Compared  to prior study 09/21/20, pericardial effusion has essentially resolved.   2. Left ventricular ejection fraction, by estimation, is 40%. The left  ventricle has moderately decreased function. The left ventricle  demonstrates global hypokinesis. There is severe left ventricular  hypertrophy.   3. Right ventricular systolic function is moderately reduced. The right  ventricular size is normal. Severely increased right ventricular wall  thickness.   4. Trivial mitral valve regurgitation.   5. The inferior vena cava is dilated in size with <50% respiratory  variability, suggesting right atrial pressure of 15 mmHg.   Echo 09/21/20 1. There is no left ventricular thrombus with Definity contrast. Left  ventricular ejection fraction, by estimation, is 35 to 40%. The left  ventricle has moderately decreased function. The left ventricle  demonstrates regional wall motion abnormalities  (see scoring diagram/findings for description). There is global left  ventricular hypokinesis, disproportionately severe in the mid-apical  anterior and anterolateral walls and apex. The anterior septum has  relatively preserved contractility. There is  severe concentric left ventricular hypertrophy. Left ventricular diastolic  parameters are  consistent with Grade II diastolic dysfunction  (pseudonormalization). Elevated left atrial pressure.   2. Right ventricular systolic function is moderately reduced. The right  ventricular size  is normal. Mildly increased right ventricular wall  thickness. There is moderately elevated pulmonary artery systolic  pressure.   3. Left atrial size was severely dilated.   4. Right atrial size was mildly dilated.   5. Large pericardial effusion. The pericardial effusion is  circumferential. There is no evidence of cardiac tamponade.   6. The mitral valve is normal in structure. Mild mitral valve  regurgitation.   7. The aortic valve is tricuspid. Aortic valve regurgitation is not  visualized. No aortic stenosis is present.   8. The inferior vena cava is dilated in size with <50% respiratory  variability, suggesting right atrial pressure of 15 mmHg.   Comparison(s): The left ventricular function is significantly worse. The  left ventricular wall motion abnormality is worse. The pericardial  effusion is slightly larger. There are no overt findings to support  tamponade, other than inferior vena cava  dilation. However, the sensitivity of echo for the diagnosis of  pericardial tamponade is diminished in the setting of pulmonary artery  hypertension.   R/L Cath 09/20/20 Hemodynamic findings consistent with moderate pulmonary hypertension. LV end diastolic pressure is severely elevated. There is no aortic valve stenosis. Mid LAD to Dist LAD lesion is 80% stenosed. Ramus lesion is 20% stenosed.   SUMMARY Severe single-vessel disease with heavily calcified mid LAD 80% stenosis Acute Combined Systolic and Diastolic Heart Failure (systolic function appears to be reduced, to conserve contrast, LV gram not performed), but LVEDP 32 mmHg and PCWP 28 mmHg. Systemic Hypertension with Mean Arterial Pressure 123 mmHg Secondary Pulmonary Hypertension: PAP 67/19 mmHg-mean 39 mmHg with RVP-EDP of 67/2 mmHg-15  mmHg, RVP and RAP of 14 mmHg. Moderate to severely reduced Cardiac Output-Index: 4.04-2.09   RECOMMENDATIONS With significant CHF, orthopnea and PND with LVEDP of 32 mmHg, patient is not stable for LAD PCI which would likely require atherectomy. We will admit to cardiology service for titration of cardiac meds and diuresis: Converting losartan to [facility], otherwise continue home medications. IV Lasix 40 mg twice daily Check 2D echo Reassess volume status in the next 1 to 2 days to determine feasibility for staged LAD PCI (via femoral access due to extreme tortuosity of the innominate artery.)   Slight scale insulin; consider adding Farxiga or Jardiance  EKG: EKG was not ordered today  Recent Labs: 09/27/2020: TSH 1.520 02/07/2021: ALT 51 06/10/2021: BUN 42; Creatinine, Ser 2.19; Hemoglobin 13.4; Platelets 127; Potassium 4.2; Sodium 142  Recent Lipid Panel    Component Value Date/Time   CHOL 88 11/23/2020 1056   TRIG 64.0 11/23/2020 1056   HDL 41.90 11/23/2020 1056   CHOLHDL 2 11/23/2020 1056   VLDL 12.8 11/23/2020 1056   LDLCALC 34 11/23/2020 1056   LDLCALC 61 01/28/2020 1000     Risk Assessment/Calculations:          Physical Exam:    VS:  BP 120/62 (BP Location: Left Arm, Patient Position: Sitting, Cuff Size: Normal)    Pulse (!) 52    Ht 5\' 6"  (1.676 m)    Wt 164 lb (74.4 kg)    SpO2 97%    BMI 26.47 kg/m     Wt Readings from Last 3 Encounters:  07/05/21 164 lb (74.4 kg)  06/16/21 165 lb 9.6 oz (75.1 kg)  06/10/21 165 lb 9.6 oz (75.1 kg)     GEN:  Well nourished, well developed in no acute distress HEENT: Normal NECK: No JVD; No carotid bruits LYMPHATICS: No lymphadenopathy CARDIAC: RRR, no murmurs, rubs, gallops RESPIRATORY:  Clear to auscultation without rales, wheezing or rhonchi  ABDOMEN: Soft, non-tender, non-distended MUSCULOSKELETAL:  No edema; No deformity  SKIN: Warm and dry NEUROLOGIC:  Alert and oriented x 3 PSYCHIATRIC:  Normal affect    ASSESSMENT:    1. HFrEF (heart failure with reduced ejection fraction) ([place])   2. Hereditary cardiac amyloidosis (HCC)   3. Stage 3b chronic kidney disease   4. S/P pericardial window creation   5. Pure hypercholesterolemia     PLAN:    HFrEF (heart failure with reduced ejection fraction) ([place]) He had biopsy of the myocardium consistent with cardiac amyloidosis.  Subsequent PYP equivocal.  He is genetically positive for hereditary ATTR amyloidosis.  Given his clinical situation severe LVH noted on echocardiogram, Tafamidis was started.  He was approved for this medication and started about 3 weeks ago.  Continuing with goal-directed medical therapy which includes carvedilol, empagliflozin.  He is not on ARN I secondary to chronic kidney disease stage IV  Hereditary cardiac amyloidosis (HCC) Pericardial biopsy positive for amyloidosis PYP scan equivocal Genetic screening positive for ATTR amyloid On tafamidis.  Reviewed lab work.  Renal function noted.  Appreciate Dr. Kathalene Frames assistance  Stage 3b chronic kidney disease We will have an upcoming nephrology appointment referred by Dr. Quay Burow.  S/P pericardial window creation Repeat echo showed no residual effusion.  Pure hypercholesterolemia Currently on Crestor 40 mg daily.  LDL 34.  Excellent.  No myalgias.   4-month follow-up with APP.       Medication Adjustments/Labs and Tests Ordered: Current medicines are reviewed at length with the patient today.  Concerns regarding medicines are outlined above.  No orders of the defined types were placed in this encounter.   No orders of the defined types were placed in this encounter.    Patient Instructions  Medication Instructions:  The current medical regimen is effective;  continue present plan and medications.  *If you need a refill on your cardiac medications before your next appointment, please call your pharmacy*  Follow-Up: At [facility], you and your health  needs are our priority.  As part of our continuing mission to provide you with exceptional heart care, we have created designated Provider Care Teams.  These Care Teams include your primary Cardiologist (physician) and Advanced Practice Providers (APPs -  Physician Assistants and Nurse Practitioners) who all work together to provide you with the care you need, when you need it.  We recommend signing up for the patient portal called "MyChart".  Sign up information is provided on this After Visit Summary.  MyChart is used to connect with patients for Virtual Visits (Telemedicine).  Patients are able to view lab/test results, encounter notes, upcoming appointments, etc.  Non-urgent messages can be sent to your provider as well.   To learn more about what you can do with MyChart, go to [email or web address].    Your next appointment:   4 month(s)  The format for your next appointment:   In Person  Provider:   Nicholes Rough, PA-C, Melina Copa, PA-C, Ermalinda Barrios, PA-C, Christen Bame, NP, or Richardson Dopp, PA-C     Then, Candee Furbish, MD will plan to see you again in 1 year(s).{   Thank you for choosing [facility]!!       I,Mykaella Javier,acting as a scribe for Candee Furbish, MD.,have documented all relevant documentation on the behalf of Candee Furbish, MD,as directed by  Candee Furbish, MD while in the presence of Candee Furbish, MD.  I, Candee Furbish,  MD, have reviewed all documentation for this visit. The documentation on 07/05/21 for the exam, diagnosis, procedures, and orders are all accurate and complete.   Signed, Candee Furbish, MD  07/05/2021 11:22 AM    [facility]

## 2021-07-05 NOTE — Assessment & Plan Note (Addendum)
He had biopsy of the myocardium consistent with cardiac amyloidosis.  Subsequent PYP equivocal.  He is genetically positive for hereditary ATTR amyloidosis.  Given his clinical situation severe LVH noted on echocardiogram, Tafamidis was started.  He was approved for this medication and started about 3 weeks ago.  Continuing with goal-directed medical therapy which includes carvedilol, empagliflozin.  He is not on ARN I secondary to chronic kidney disease stage IV

## 2021-07-05 NOTE — Patient Instructions (Signed)
Medication Instructions:  The current medical regimen is effective;  continue present plan and medications.  *If you need a refill on your cardiac medications before your next appointment, please call your pharmacy*  Follow-Up: At [facility], you and your health needs are our priority.  As part of our continuing mission to provide you with exceptional heart care, we have created designated Provider Care Teams.  These Care Teams include your primary Cardiologist (physician) and Advanced Practice Providers (APPs -  Physician Assistants and Nurse Practitioners) who all work together to provide you with the care you need, when you need it.  We recommend signing up for the patient portal called "MyChart".  Sign up information is provided on this After Visit Summary.  MyChart is used to connect with patients for Virtual Visits (Telemedicine).  Patients are able to view lab/test results, encounter notes, upcoming appointments, etc.  Non-urgent messages can be sent to your provider as well.   To learn more about what you can do with MyChart, go to [email or web address].    Your next appointment:   4 month(s)  The format for your next appointment:   In Person  Provider:   Nicholes Rough, PA-C, Melina Copa, PA-C, Ermalinda Barrios, PA-C, Christen Bame, NP, or Richardson Dopp, PA-C     Then, Candee Furbish, MD will plan to see you again in 1 year(s).{   Thank you for choosing [facility]!!

## 2021-07-07 ENCOUNTER — Other Ambulatory Visit: Payer: Self-pay | Admitting: Internal Medicine

## 2021-07-11 ENCOUNTER — Other Ambulatory Visit (HOSPITAL_BASED_OUTPATIENT_CLINIC_OR_DEPARTMENT_OTHER): Payer: Medicare HMO | Admitting: Cardiology

## 2021-07-12 DIAGNOSIS — E785 Hyperlipidemia, unspecified: Secondary | ICD-10-CM

## 2021-07-12 DIAGNOSIS — I2510 Atherosclerotic heart disease of native coronary artery without angina pectoris: Secondary | ICD-10-CM | POA: Diagnosis not present

## 2021-07-12 DIAGNOSIS — E1159 Type 2 diabetes mellitus with other circulatory complications: Secondary | ICD-10-CM | POA: Diagnosis not present

## 2021-07-12 DIAGNOSIS — I509 Heart failure, unspecified: Secondary | ICD-10-CM | POA: Diagnosis not present

## 2021-07-12 DIAGNOSIS — I110 Hypertensive heart disease with heart failure: Secondary | ICD-10-CM

## 2021-07-12 DIAGNOSIS — Z7984 Long term (current) use of oral hypoglycemic drugs: Secondary | ICD-10-CM | POA: Diagnosis not present

## 2021-07-18 DIAGNOSIS — H35372 Puckering of macula, left eye: Secondary | ICD-10-CM | POA: Diagnosis not present

## 2021-07-18 DIAGNOSIS — H2511 Age-related nuclear cataract, right eye: Secondary | ICD-10-CM | POA: Diagnosis not present

## 2021-07-18 DIAGNOSIS — H47293 Other optic atrophy, bilateral: Secondary | ICD-10-CM | POA: Diagnosis not present

## 2021-07-18 DIAGNOSIS — E119 Type 2 diabetes mellitus without complications: Secondary | ICD-10-CM | POA: Diagnosis not present

## 2021-07-18 DIAGNOSIS — R6889 Other general symptoms and signs: Secondary | ICD-10-CM | POA: Diagnosis not present

## 2021-07-29 ENCOUNTER — Other Ambulatory Visit: Payer: Self-pay

## 2021-07-29 MED ORDER — JARDIANCE 10 MG PO TABS: 10.0000 mg | ORAL_TABLET | Freq: Every morning | ORAL | 3 refills | Status: DC

## 2021-08-09 ENCOUNTER — Other Ambulatory Visit: Payer: Self-pay

## 2021-08-09 ENCOUNTER — Other Ambulatory Visit (INDEPENDENT_AMBULATORY_CARE_PROVIDER_SITE_OTHER): Payer: Medicare HMO

## 2021-08-09 DIAGNOSIS — E1165 Type 2 diabetes mellitus with hyperglycemia: Secondary | ICD-10-CM | POA: Diagnosis not present

## 2021-08-09 LAB — BASIC METABOLIC PANEL
BUN: 60 mg/dL — ABNORMAL HIGH (ref 6–23)
CO2: 21 mEq/L (ref 19–32)
Calcium: 9.3 mg/dL (ref 8.4–10.5)
Chloride: 112 mEq/L (ref 96–112)
Creatinine, Ser: 3.26 mg/dL — ABNORMAL HIGH (ref 0.40–1.50)
GFR: 18.11 mL/min — ABNORMAL LOW (ref >60.00)
Glucose, Bld: 66 mg/dL — ABNORMAL LOW (ref 70–99)
Potassium: 3.5 mEq/L (ref 3.5–5.1)
Sodium: 143 mEq/L (ref 135–145)

## 2021-08-09 LAB — HEMOGLOBIN A1C: Hgb A1c MFr Bld: 7 % — ABNORMAL HIGH (ref 4.6–6.5)

## 2021-08-10 LAB — FRUCTOSAMINE: Fructosamine: 287 umol/L — ABNORMAL HIGH (ref 0–285)

## 2021-08-12 ENCOUNTER — Ambulatory Visit: Payer: Medicare HMO | Admitting: Endocrinology

## 2021-08-12 ENCOUNTER — Encounter: Payer: Self-pay | Admitting: Endocrinology

## 2021-08-12 VITALS — BP 150/82 | HR 49 | Ht 66.0 in | Wt 175.8 lb

## 2021-08-12 DIAGNOSIS — E1165 Type 2 diabetes mellitus with hyperglycemia: Secondary | ICD-10-CM | POA: Diagnosis not present

## 2021-08-12 DIAGNOSIS — N184 Chronic kidney disease, stage 4 (severe): Secondary | ICD-10-CM | POA: Diagnosis not present

## 2021-08-12 MED ORDER — LINAGLIPTIN 5 MG PO TABS: 5.0000 mg | ORAL_TABLET | Freq: Every day | ORAL | 1 refills | Status: DC

## 2021-08-12 NOTE — Patient Instructions (Addendum)
Stop glipizide, new Rx is [place] '5mg'$  ? ?Check blood sugars on waking up 2-3 days a week ? ?Also check blood sugars about 2 hours after meals and do this after different meals by rotation ? ?Recommended blood sugar levels on waking up are 90-130 and about 2 hours after meal is 130-160 ? ?Please bring your blood sugar monitor to each visit, thank you ? ?

## 2021-08-12 NOTE — Progress Notes (Signed)
? ? ?Patient ID: Corey [name], male   DOB: 18-Dec-1945, 76 y.o.   MRN: [id number] ? ? ?Reason for Appointment : Followup of various problems ? ?History of Present Illness  ?        ?Diagnosis: Type 2 diabetes mellitus, date of diagnosis: 1997     ? ?Past history: The patient was initially treated with various oral hypoglycemic drugs and details are not available. ?About 2009 he was started on insulin and has either taken premixed insulin or Lantus once a day ?He had difficulty affording his Lantus insulin and had preferred to take premixed insulin ?Subsequently insulin was tapered off ? ?Recent history:   ? ?Oral hypoglycemic drugs:   Jardiance 10 mg daily, glipizide ER 2.5 mg daily ? ?Current blood sugar patterns and problems: ? ?His A1c is about the same at 7 compared to 7.2 ? ?Previous fructosamine was 338 ? ?He is monitoring his blood sugars very sporadically and mostly before dinnertime and rarely after meals  ?On his last visit because of his renal insufficiency he was changed from Amaryl to glipizide ER  ?Also was on Jardiance 25 mg ?However for some reason his cardiologist changed his Jardiance to 10 mg in the last couple of months, does have known combined systolic and diastolic heart failure ?Although he has not had any hypoglycemia at home his lab glucose late morning was only 66  ?He has only 1 high reading which he thinks was from either eating more carbohydrate or having a regular soft drink  ?Exercise: He walks about 1 miles ?His weight has fluctuated recently, not clear if this is higher now  ?His weight is related to his fluid intake ?Appetite is "normal" ? ? ?Glucose monitoring:  done less than 1 time a day         Glucometer:  Accu-Chek      ? ?Blood Glucose readings from monitor review ? ? ?PRE-MEAL Fasting Lunch Dinner Bedtime Overall  ?Glucose range: 107  70-81    ?Mean/median:    129 104  ? ?POST-MEAL PC Breakfast PC Lunch PC Dinner  ?Glucose range:   140, 220  ?Mean/median:      ? ? ?Previously: ? ?PRE-MEAL Fasting Lunch Dinner Bedtime Overall  ?Glucose range: 53-126    53-148  ?Mean/median: 100 91  97 101/97  ? ? ? ?Self-care:      ? Meals: 2-3 meals per day. Egg in am at 9-10 am or grits, rarely sandwich at lunch. His largest meal is at suppertime at about 8 pm.  ?       ?Dietician visit: Most recent: 02/2014        ?       ?Compliance with the medical regimen:  Fair.   ? ?Wt Readings from Last 3 Encounters:  ?08/12/21 175 lb 12.8 oz (79.7 kg)  ?07/05/21 164 lb (74.4 kg)  ?06/16/21 165 lb 9.6 oz (75.1 kg)  ? ? ?Lab Results  ?Component Value Date  ? HGBA1C 7.0 (H) 08/09/2021  ? HGBA1C 7.2 (H) 05/06/2021  ? HGBA1C 6.8 (H) 02/07/2021  ? ? ?Lab Results  ?Component Value Date  ? MICROALBUR 8.5 (H) 11/23/2020  ? [place] 34 11/23/2020  ? CREATININE 3.26 (H) 08/09/2021  ? ?Lab Results  ?Component Value Date  ? FRUCTOSAMINE 287 (H) 08/09/2021  ? FRUCTOSAMINE 338 (H) 01/03/2021  ? FRUCTOSAMINE 287 (H) 03/01/2020  ? ? ?Lab on 08/09/2021  ?Component Date Value Ref Range Status  ? Fructosamine 08/09/2021 287 (H)  0 - 285 umol/L Final  ? Comment: Published reference interval for apparently healthy subjects ?between age 50 and 52 is 72 - 285 umol/L and in a poorly ?controlled diabetic population is 228 - 563 umol/L with a ?mean of 396 umol/L. ?  ? Sodium 08/09/2021 143  135 - 145 mEq/L Final  ? Potassium 08/09/2021 3.5  3.5 - 5.1 mEq/L Final  ? Chloride 08/09/2021 112  96 - 112 mEq/L Final  ? CO2 08/09/2021 21  19 - 32 mEq/L Final  ? Glucose, Bld 08/09/2021 66 (L)  70 - 99 mg/dL Final  ? BUN 08/09/2021 60 (H)  6 - 23 mg/dL Final  ? Creatinine, Ser 08/09/2021 3.26 (H)  0.40 - 1.50 mg/dL Final  ? GFR 08/09/2021 18.11 (L)  >60.00 mL/min Final  ? Calculated using the CKD-EPI Creatinine Equation (2021)  ? Calcium 08/09/2021 9.3  8.4 - 10.5 mg/dL Final  ? Hgb A1c MFr Bld 08/09/2021 7.0 (H)  4.6 - 6.5 % Final  ? Glycemic Control Guidelines for People with Diabetes:Non Diabetic:  <6%Goal of Therapy:  <7%Additional Action Suggested:  >8%   ? ?Other active medical problems addressed: See review of systems ? ? ?Allergies as of 08/12/2021   ? ?   Reactions  ? Hydrochlorothiazide Other (See Comments)  ? Gout , uncontrolled diabetes and renal insufficiency  ? ?  ? ?  ?Medication List  ?  ? ?  ? Accurate as of August 12, 2021 10:10 AM. If you have any questions, ask your nurse or doctor.  ?  ?  ? ?  ? ?Accu-Chek Aviva Plus test strip ?Generic drug: glucose blood ?Use as instructed ?  ?Accu-Chek Aviva Plus test strip ?Generic drug: glucose blood ?TEST BLOOD SUGAR TWICE DAILY AS DIRECTED ?  ?albuterol 108 (90 Base) MCG/ACT inhaler ?Commonly known as: VENTOLIN HFA ?Inhale 2 puffs into the lungs every 4 (four) hours as needed for wheezing or shortness of breath. ?  ?allopurinol 100 MG tablet ?Commonly known as: ZYLOPRIM ?Take 1/2 (one-half) tablet by mouth once daily ?  ?aspirin EC 81 MG tablet ?Take 1 tablet (81 mg total) by mouth daily. ?  ?bismuth subsalicylate 768 TL/57WI suspension ?Commonly known as: PEPTO BISMOL ?Take 30 mLs by mouth every 6 (six) hours as needed for indigestion. ?  ?carvedilol 6.25 MG tablet ?Commonly known as: COREG ?Take 1 tablet (6.25 mg total) by mouth 2 (two) times daily with a meal. ?  ?colchicine 0.6 MG tablet ?Commonly known as: Colcrys ?Take 2 tabs once and then one tab one hour later as needed for gout ?  ?DropSafe Alcohol Prep 70 % Pads ?USE 4 (FOUR) TIMES DAILY ?  ?glipiZIDE 2.5 MG 24 hr tablet ?Commonly known as: GLUCOTROL XL ?Take 1 tablet (2.5 mg total) by mouth daily with breakfast. ?  ?hydrALAZINE 50 MG tablet ?Commonly known as: APRESOLINE ?Take 2 tablets (100 mg total) by mouth 3 (three) times daily. ?  ?isosorbide mononitrate 60 MG 24 hr tablet ?Commonly known as: IMDUR ?Take 2 tablets (120 mg total) by mouth daily. ?  ?Jardiance 10 MG Tabs tablet ?Generic drug: empagliflozin ?Take 1 tablet (10 mg total) by mouth every morning. ?  ?nitroGLYCERIN 0.4 MG SL tablet ?Commonly known  as: NITROSTAT ?Place 0.4 mg under the tongue every 5 (five) minutes as needed for chest pain. ?  ?oxyCODONE-acetaminophen 5-325 MG tablet ?Commonly known as: Percocet ?Take 1 tablet by mouth every 4 (four) hours as needed for severe pain. ?  ?rosuvastatin 40  MG tablet ?Commonly known as: CRESTOR ?Take 1 tablet (40 mg total) by mouth daily. ?  ?torsemide 20 MG tablet ?Commonly known as: DEMADEX ?Take 1 tablet (20 mg total) by mouth 2 (two) times a week. Mondays and Fridays ?What changed:  ?when to take this ?additional instructions ?  ?TRUEplus Lancets 33G Misc ?USE 4 (FOUR) TIMES DAILY ?  ?Vyndamax 61 MG Caps ?Generic drug: Tafamidis ?Take 61 mg by mouth daily. ?  ? ?  ? ? ?Allergies:  ?Allergies  ?Allergen Reactions  ? Hydrochlorothiazide Other (See Comments)  ?  Gout , uncontrolled diabetes and renal insufficiency  ? ? ?Past Medical History:  ?Diagnosis Date  ? Cataract   ? CHF (congestive heart failure) ([place])   ? Chronic heart failure with preserved ejection fraction (HFpEF) ([place])   ? Colon polyp 2003  ? Dr [name]; F/U was to be 2008( not completed)  ? CVA (cerebral infarction) 2014  ? Diabetes mellitus   ? Diverticulosis 2003  ? Dyspnea   ? with exertion  ? Gout   ? Hypertension   ? Myocardial infarction [facility]) 2014  ? ?? PEA cardiac arrest  ? OSA (obstructive sleep apnea) 03/27/2018  ? Pneumonia 09/29/2011  ? Avelox X 10 days as OP  ? Prostate cancer ([place])   ? Renal insufficiency   ? Seizures ([place])   ? not treated for seizure disorder; had a seizure after stroke 2014; no seizure since then  ? ? ?Past Surgical History:  ?Procedure Laterality Date  ? COLONOSCOPY W/ POLYPECTOMY  2003  ? no F/U ([place] discussed 12/03/12)  ? INGUINAL HERNIA REPAIR Left 06/16/2021  ? Procedure: LAPAROSCOPIC, POSSIBLY OPEN LEFT INGUINAL HERNIA REPAIR WITH MESH;  Surgeon: Felicie Morn, MD;  Location: [facility];  Service: General;  Laterality: Left;  ? INSERTION OF MESH N/A 07/16/2017  ? Procedure: INSERTION OF MESH;  Surgeon: [name], MD;  Location: [facility];  Service: General;  Laterality: N/A;  ? PEG PLACEMENT  2014  ? PEG TUBE REMOVAL  2014  ? PROSTATE BIOPSY    ? RIGHT/LEFT HEART CATH AND CORONARY ANGIOGRAPHY N/A 09/20/2020  ? Procedure: [facility]

## 2021-08-14 ENCOUNTER — Encounter: Payer: Self-pay | Admitting: Endocrinology

## 2021-08-15 ENCOUNTER — Telehealth: Payer: Self-pay

## 2021-08-15 ENCOUNTER — Ambulatory Visit (INDEPENDENT_AMBULATORY_CARE_PROVIDER_SITE_OTHER): Payer: Medicare HMO | Admitting: *Deleted

## 2021-08-15 DIAGNOSIS — I5040 Unspecified combined systolic (congestive) and diastolic (congestive) heart failure: Secondary | ICD-10-CM

## 2021-08-15 DIAGNOSIS — E1165 Type 2 diabetes mellitus with hyperglycemia: Secondary | ICD-10-CM

## 2021-08-15 MED ORDER — SITAGLIPTIN PHOSPHATE 25 MG PO TABS: 25.0000 mg | ORAL_TABLET | Freq: Every day | ORAL | 2 refills | Status: DC

## 2021-08-15 NOTE — Addendum Note (Signed)
Addended by: Cinda Quest on: 08/15/2021 03:00 PM ? ? Modules accepted: Orders ? ?

## 2021-08-15 NOTE — Telephone Encounter (Signed)
Patient called in states that he cannot afford the Tradjenta medication and wants to know if you can put him on something different. ?

## 2021-08-16 NOTE — Chronic Care Management (AMB) (Signed)
?Chronic Care Management  ? ?CCM RN Visit Note ? ?08/16/2021 ?Name: Corey [name] MRN: [id number] DOB: 03-28-1946 ? ?Subjective: ?Corey [name] is a 76 y.o. year old male who is a primary care patient of Burns, [name], MD. The care management team was consulted for assistance with disease management and care coordination needs.   ? ?Engaged with patient and his spouse/ caregiver [place], on [facility] DPR by telephone for follow up visit in response to provider referral for case management and/or care coordination services.  ? ?Consent to Services:  ?The patient was given information about Chronic Care Management services, agreed to services, and gave verbal consent prior to initiation of services.  Please see initial visit note for detailed documentation.  ?Patient agreed to services and verbal consent obtained.  ? ?Assessment: Review of patient past medical history, allergies, medications, health status, including review of consultants reports, laboratory and other test data, was performed as part of comprehensive evaluation and provision of chronic care management services.  ? ?SDOH (Social Determinants of Health) assessments and interventions performed:  ?SDOH Interventions   ? ?Flowsheet Row Most Recent Value  ?SDOH Interventions   ?Food Insecurity Interventions Intervention Not Indicated  [continues to deny food insecurity]  ?Housing Interventions Intervention Not Indicated  ?Transportation Interventions Intervention Not Indicated  [spouse and/ or son continues to provide transportation]  ? ?  ?CCM Care Plan ? ?Allergies  ?Allergen Reactions  ? Hydrochlorothiazide Other (See Comments)  ?  Gout , uncontrolled diabetes and renal insufficiency  ? ?Outpatient Encounter Medications as of 08/15/2021  ?Medication Sig Note  ? ACCU-CHEK AVIVA PLUS test strip TEST BLOOD SUGAR TWICE DAILY AS DIRECTED   ? ACCU-CHEK AVIVA PLUS test strip Use as instructed   ? albuterol (VENTOLIN HFA) 108 (90 Base) MCG/ACT inhaler Inhale 2 puffs into the  lungs every 4 (four) hours as needed for wheezing or shortness of breath.   ? Alcohol Swabs (DROPSAFE ALCOHOL PREP) 70 % PADS USE 4 (FOUR) TIMES DAILY (Patient not taking: Reported on 08/12/2021)   ? allopurinol (ZYLOPRIM) 100 MG tablet Take 1/2 (one-half) tablet by mouth once daily   ? aspirin EC 81 MG tablet Take 1 tablet (81 mg total) by mouth daily.   ? bismuth subsalicylate (PEPTO BISMOL) 262 MG/15ML suspension Take 30 mLs by mouth every 6 (six) hours as needed for indigestion. (Patient not taking: Reported on 08/12/2021)   ? carvedilol (COREG) 6.25 MG tablet Take 1 tablet (6.25 mg total) by mouth 2 (two) times daily with a meal.   ? colchicine (COLCRYS) 0.6 MG tablet Take 2 tabs once and then one tab one hour later as needed for gout   ? glipiZIDE (GLUCOTROL XL) 2.5 MG 24 hr tablet Take 1 tablet (2.5 mg total) by mouth daily with breakfast.   ? hydrALAZINE (APRESOLINE) 50 MG tablet Take 2 tablets (100 mg total) by mouth 3 (three) times daily.   ? isosorbide mononitrate (IMDUR) 60 MG 24 hr tablet Take 2 tablets (120 mg total) by mouth daily.   ? JARDIANCE 10 MG TABS tablet Take 1 tablet (10 mg total) by mouth every morning.   ? nitroGLYCERIN (NITROSTAT) 0.4 MG SL tablet Place 0.4 mg under the tongue every 5 (five) minutes as needed for chest pain.   ? oxyCODONE-acetaminophen (PERCOCET) 5-325 MG tablet Take 1 tablet by mouth every 4 (four) hours as needed for severe pain.   ? rosuvastatin (CRESTOR) 40 MG tablet Take 1 tablet (40 mg total) by mouth daily.   ?  sitaGLIPtin (JANUVIA) 25 MG tablet Take 1 tablet (25 mg total) by mouth daily.   ? Tafamidis (VYNDAMAX) 61 MG CAPS Take 61 mg by mouth daily. 06/07/2021: New medication awaiting arrival from specialty pharmacy  ? torsemide (DEMADEX) 20 MG tablet Take 1 tablet (20 mg total) by mouth 2 (two) times a week. Mondays and Fridays (Patient taking differently: Take 20 mg by mouth every Monday, Wednesday, and Friday.)   ? TRUEplus Lancets 33G MISC USE 4 (FOUR) TIMES  DAILY (Patient not taking: Reported on 08/12/2021)   ? ?No facility-administered encounter medications on file as of 08/15/2021.  ? ?Patient Active Problem List  ? Diagnosis Date Noted  ? Pure hypercholesterolemia 07/05/2021  ? Preoperative cardiovascular examination 04/04/2021  ? HFrEF (heart failure with reduced ejection fraction) ([place]) 04/03/2021  ? Bilateral hearing loss 01/04/2021  ? Bilateral impacted cerumen 01/04/2021  ? Tympanic membrane perforation, left 01/04/2021  ? Physical deconditioning 10/16/2020  ? Hereditary cardiac amyloidosis ([place]) 09/30/2020  ? 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 Progressive angina ([place]) - Atypical; DOE 09/20/2020  ? CAD (coronary artery disease) 09/20/2020  ? Acute on chronic combined systolic and diastolic CHF (congestive heart failure) ([place]) 09/20/2020  ? Acute combined systolic and diastolic heart failure ([place]) 09/20/2020  ? Soft tissue mass 07/30/2020  ? Contracture of joint of finger of left hand 07/30/2020  ? Acute respiratory disease due to COVID-19 virus 06/05/2019  ? DOE (dyspnea on exertion) 03/01/2019  ? Mild cognitive impairment 12/05/2018  ? Slurred speech 07/23/2018  ? Muscle twitching 07/23/2018  ? OSA (obstructive sleep apnea) 03/27/2018  ? Snores 01/21/2018  ? Poor balance 01/23/2017  ? Ankle swelling, left 09/06/2016  ? Bradycardia 01/28/2016  ? Weak urinary stream 12/31/2015  ? Stage 3b chronic kidney disease 09/15/2015  ? Ventral hernia without obstruction or gangrene 09/15/2015  ? Prostate cancer ([place]) 07/25/2015  ? Erectile dysfunction 05/04/2015  ? Urinary frequency 05/04/2015  ? 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 Abnormal finding on MRI of brain 02/14/2015  ? PEA (Pulseless electrical activity) ([place]) 12/03/2012  ? Diabetes ([place]) 06/30/2011  ? Gout 03/05/2007  ? Essential hypertension 03/05/2007  ? ?Conditions to be addressed/monitored:  CHF and DMII ? ?Care Plan : RN Care Manager Plan of Care  ?Updates made by Knox Royalty, RN since 08/16/2021 12:00 AM  ?  ? ?Problem: Chronic Disease Management Needs   ?Priority: High  ?  ? ?Long-Range Goal: Ongoing adherence to established plan of care for long term chronic disease management   ?Start Date: 05/12/2021  ?Expected End Date: 05/12/2022  ?Priority: High  ?Note:   ?Current Barriers:  ?Chronic Disease Management support and education needs related to CHF and DMII ?Unable to independently manage medications: reports decreased vision due to glaucoma; wife and son assists in medication management ?Limited health literacy, hard of hearing in (L) ear: wife participates in all of patient medical affairs and is very supportive/ helpful ?Challenges following low salt diet ? ?RNCM Clinical Goal(s):  ?Patient will demonstrate ongoing health management independence as evidenced by DMII; CHF/ CKD- III        through collaboration with RN Care manager, provider, and care team.  ? ?Interventions: ?1:1 collaboration with primary care provider regarding development and update of comprehensive plan of care as evidenced by provider attestation and co-signature ?Inter-disciplinary care team collaboration (see longitudinal plan of care) ?Evaluation of current treatment plan related to  self management and patient's adherence to plan as established  by provider ?Review of patient status, including review of consultants reports, relevant laboratory and other test results, and medications completed ?SDOH updated: no new/ unmet concerns identified ?Pain assessment updated:  denies pain today  ?Falls assessment updated: continues to deny new/ recent falls x 12 months- not currently using assistive devices;  positive reinforcement provided with encouragement to continue efforts at fall prevention; previously provided education around fall risks/ prevention reinforced ?Medications discussed:  reports spouse continues to manage medications; denies current concerns/ issues/ questions around medications; endorses  adherence to taking all medications as prescribed ?Patient/ caregiver reports unable to afford recently prescribed Tradjenta post- endocrinology provider appointment 08/12/21 ?Confirmed they have reached o

## 2021-08-16 NOTE — Patient Instructions (Signed)
Visit Information ? ?Carloyn Manner and [place], thank you for taking time to talk with me today about [facility] healthcare needs. Please don't hesitate to contact me if I can be of assistance to you before our next scheduled telephone appointment ? ?Below are the goals we discussed today:  ?Patient Self-Care Activities: ?Patient [place] will: ?Take medications as prescribed ?Attend all scheduled provider appointments ?Call pharmacy for medication refills ?Call provider office for new concerns or questions ?Continue to check fasting (first thing in the morning, before eating) blood sugars at home: the blood sugars you reported today are all in very good range ?Continue to check after-meal blood sugars each day, 2 hours after eating a regular size meal: the blood sugars you reported today are all in very good range ?Continue to follow heart healthy, low salt, low cholesterol, carbohydrate-modified, low sugar diet ?Continue to monitor and record weights at home each day: Today you reported a weight of 168 lb ?Call office if I gain more than 2 pounds in one day or 5 pounds in one week, especially if this is associated with chest tightness, swelling in your legs/ ankles, and new or worsened shortness of breath ?Watch for swelling in feet, ankles and legs every day ?Keep legs up while sitting, and continue walking for exercise when the weather is nice outside ?Keep limiting the salt in your diet: please decrease your salt intake and do not add salt to your meals ?Keep up the great work preventing falls ?Read over the enclosed information about signs/ symptoms of low blood sugar and what you should do if you experience these signs/ symptoms ?  ?Our next scheduled telephone follow up visit/ appointment is scheduled on:   Tuesday, Sep 27, 2021 at 3:00 pm- This is a PHONE CALL appointment ? ?If you need to cancel or re-schedule our visit, please call [phone] and our care guide team will be happy to assist you. ?  ?I look forward to hearing  about your progress. ?  ?[name], RN, BSN, CCRN Alumnus ?[place] ?[phone]: direct office ? ?If you are experiencing a Mental Health or [place] or need someone to talk to, please  ?call the Suicide and Crisis Lifeline: 988 ?call the [place] National Suicide Prevention Lifeline: 973-431-2558 or TTY: 907-651-7205 TTY 412 656 5554) to talk to a trained counselor ?call 1-800-273-TALK (toll free, 24 hour hotline) ?go to [facility] [address], [place] [phone]) ?call 911  ? ?The patient verbalized understanding of instructions, educational materials, and care plan provided today and agreed to receive a mailed copy of patient instructions, educational materials, and care plan ? ?Hypoglycemia-- LOW BLOOD SUGAR ?Hypoglycemia occurs when the level of sugar (glucose) in the blood is too low. Hypoglycemia can happen in people who have or do not have diabetes. It can develop quickly, and it can be a medical emergency. For most people, a blood glucose level below 70 mg/dL (3.9 mmol/L) is considered hypoglycemia. ?Glucose is a type of sugar that provides the body's main source of energy. Certain hormones (insulin and glucagon) control the level of glucose in the blood. Insulin lowers blood glucose, and glucagon raises blood glucose. Hypoglycemia can result from having too much insulin in the bloodstream, or from not eating enough food that contains glucose. You may also have reactive hypoglycemia, which happens within 4 hours after eating a meal. ?What are the causes? ?Hypoglycemia occurs most often in people who have diabetes  and may be caused by: ?Diabetes medicine. ?Not eating enough, or not eating often enough. ?Increased physical activity. ?Drinking alcohol on an empty stomach. ?If you do not have diabetes, hypoglycemia may be caused by: ?A tumor in the pancreas. ?Not eating enough, or not eating  for long periods at a time (fasting). ?A severe infection or illness. ?Problems after having bariatric surgery. ?Organ failure, such as kidney or liver failure. ?Certain medicines. ?What increases the risk? ?Hypoglycemia is more likely to develop in people who: ?Have diabetes and take medicines to lower blood glucose. ?Abuse alcohol. ?Have a severe illness. ?What are the signs or symptoms? ?Symptoms vary depending on whether the condition is mild, moderate, or severe. ?Mild hypoglycemia ?Hunger. ?Sweating and feeling clammy. ?Dizziness or feeling light-headed. ?Sleepiness or restless sleep. ?Nausea. ?Increased heart rate. ?Headache. ?Blurry vision. ?Mood changes, such as irritability or anxiety. ?Tingling or numbness around the mouth, lips, or tongue. ?Moderate hypoglycemia ?Confusion and poor judgment. ?Behavior changes. ?Weakness. ?Irregular heartbeat. ?A change in coordination. ?Severe hypoglycemia ?Severe hypoglycemia is a medical emergency. It can cause: ?Fainting. ?Seizures. ?Loss of consciousness (coma). ?Death. ?How is this diagnosed? ?Hypoglycemia is diagnosed with a blood test to measure your blood glucose level. This blood test is done while you are having symptoms. Your health care provider may also do a physical exam and review your medical history. ?How is this treated? ?This condition can be treated by immediately eating or drinking something that contains sugar with 15 grams of fast-acting carbohydrate, such as: ?4 oz (120 mL) of fruit juice. ?4 oz (120 mL) of regular soda (not diet soda). ?Several pieces of hard candy. Check food labels to find out how many pieces to eat for 15 grams. ?1 Tbsp (15 mL) of sugar or honey. ?4 glucose tablets. ?1 tube of glucose gel. ?Treating hypoglycemia if you have diabetes ?If you are alert and able to swallow safely, follow the 15:15 rule: ?Take 15 grams of a fast-acting carbohydrate. Talk with your health care provider about how much you should take. Options for  getting 15 grams of fast-acting carbohydrate include: ?Glucose tablets (take 4 tablets). ?Several pieces of hard candy. Check food labels to find out how many pieces to eat for 15 grams. ?4 oz (120 mL) of fruit juice. ?4 oz (120 mL) of regular soda (not diet soda). ?1 Tbsp (15 mL) of sugar or honey. ?1 tube of glucose gel. ?Check your blood glucose 15 minutes after you take the carbohydrate. ?If the repeat blood glucose level is still at or below 70 mg/dL (3.9 mmol/L), take 15 grams of a carbohydrate again. ?If your blood glucose level does not increase above 70 mg/dL (3.9 mmol/L) after 3 tries, seek emergency medical care. ?After your blood glucose level returns to normal, eat a meal or a snack within 1 hour. ? ?Treating severe hypoglycemia ?Severe hypoglycemia is when your blood glucose level is below 54 mg/dL (3 mmol/L). Severe hypoglycemia is a medical emergency. Get medical help right away. ?If you have severe hypoglycemia and you cannot eat or drink, you will need to be given glucagon. A family member or close friend should learn how to check your blood glucose and how to give you glucagon. Ask your health care provider if you need to have an emergency glucagon kit available. ?Severe hypoglycemia may need to be treated in a hospital. The treatment may include getting glucose through an IV. You may also need treatment for the cause of your hypoglycemia. ?Follow these instructions at home: ?  General instructions ?Take over-the-counter and prescription medicines only as told by your health care provider. ?Monitor your blood glucose as told by your health care provider. ?If you drink alcohol: ?Limit how much you have to: ?0-1 drink a day for women who are not pregnant. ?0-2 drinks a day for men. ?Know how much alcohol is in your drink. In the [place] one drink equals one 12 oz bottle of beer (355 mL), one 5 oz glass of wine (148 mL), or one 1? oz glass of hard liquor (44 mL). ?Be sure to eat food along with drinking  alcohol. ?Be aware that alcohol is absorbed quickly and may have lingering effects that may result in hypoglycemia later. Be sure to do ongoing glucose monitoring. ?Keep all follow-up visits. This is impor

## 2021-08-24 DIAGNOSIS — I3139 Other pericardial effusion (noninflammatory): Secondary | ICD-10-CM | POA: Diagnosis not present

## 2021-08-24 DIAGNOSIS — R809 Proteinuria, unspecified: Secondary | ICD-10-CM | POA: Diagnosis not present

## 2021-08-24 DIAGNOSIS — I5042 Chronic combined systolic (congestive) and diastolic (congestive) heart failure: Secondary | ICD-10-CM | POA: Diagnosis not present

## 2021-08-24 DIAGNOSIS — E1122 Type 2 diabetes mellitus with diabetic chronic kidney disease: Secondary | ICD-10-CM | POA: Diagnosis not present

## 2021-08-24 DIAGNOSIS — I129 Hypertensive chronic kidney disease with stage 1 through stage 4 chronic kidney disease, or unspecified chronic kidney disease: Secondary | ICD-10-CM | POA: Diagnosis not present

## 2021-08-24 DIAGNOSIS — N1832 Chronic kidney disease, stage 3b: Secondary | ICD-10-CM | POA: Diagnosis not present

## 2021-08-24 DIAGNOSIS — N281 Cyst of kidney, acquired: Secondary | ICD-10-CM | POA: Diagnosis not present

## 2021-08-24 DIAGNOSIS — E8581 Light chain (AL) amyloidosis: Secondary | ICD-10-CM | POA: Diagnosis not present

## 2021-08-31 DIAGNOSIS — N1832 Chronic kidney disease, stage 3b: Secondary | ICD-10-CM | POA: Diagnosis not present

## 2021-09-01 NOTE — Telephone Encounter (Signed)
Called patient to make the mask fit appt. Wife states he already had the mask fit appt done. ?

## 2021-09-05 DIAGNOSIS — N1832 Chronic kidney disease, stage 3b: Secondary | ICD-10-CM | POA: Diagnosis not present

## 2021-09-11 DIAGNOSIS — E1165 Type 2 diabetes mellitus with hyperglycemia: Secondary | ICD-10-CM

## 2021-09-11 DIAGNOSIS — I5040 Unspecified combined systolic (congestive) and diastolic (congestive) heart failure: Secondary | ICD-10-CM | POA: Diagnosis not present

## 2021-09-14 DIAGNOSIS — N1832 Chronic kidney disease, stage 3b: Secondary | ICD-10-CM | POA: Diagnosis not present

## 2021-09-21 ENCOUNTER — Telehealth: Payer: Self-pay | Admitting: Physician Assistant

## 2021-09-21 ENCOUNTER — Ambulatory Visit (INDEPENDENT_AMBULATORY_CARE_PROVIDER_SITE_OTHER): Payer: Medicare HMO | Admitting: *Deleted

## 2021-09-21 DIAGNOSIS — E1165 Type 2 diabetes mellitus with hyperglycemia: Secondary | ICD-10-CM

## 2021-09-21 DIAGNOSIS — I5040 Unspecified combined systolic (congestive) and diastolic (congestive) heart failure: Secondary | ICD-10-CM

## 2021-09-21 NOTE — Chronic Care Management (AMB) (Signed)
?Chronic Care Management  ? ?CCM RN Visit Note ? ?09/21/2021 ?Name: Corey [name] MRN: [id number] DOB: 09-11-1945 ? ?Subjective: ?Corey [name] is a 76 y.o. year old male who is a primary care patient of Burns, [name], MD. The care management team was consulted for assistance with disease management and care coordination needs.   ? ?Engaged with patient and his caregiver/ spouse Corey [name] by telephone with phone on speaker mode for  acute/ unscheduled outreach  in response to provider referral for case management and/or care coordination services.  ? ?Consent to Services:  ?The patient was given information about Chronic Care Management services, agreed to services, and gave verbal consent prior to initiation of services.  Please see initial visit note for detailed documentation.  ?Patient agreed to services and verbal consent obtained.  ? ?Assessment: Review of patient past medical history, allergies, medications, health status, including review of consultants reports, laboratory and other test data, was performed as part of comprehensive evaluation and provision of chronic care management services.  ?CCM Care Plan ? ?Allergies  ?Allergen Reactions  ? Hydrochlorothiazide Other (See Comments)  ?  Gout , uncontrolled diabetes and renal insufficiency  ? ?Outpatient Encounter Medications as of 09/21/2021  ?Medication Sig Note  ? ACCU-CHEK AVIVA PLUS test strip TEST BLOOD SUGAR TWICE DAILY AS DIRECTED   ? ACCU-CHEK AVIVA PLUS test strip Use as instructed   ? albuterol (VENTOLIN HFA) 108 (90 Base) MCG/ACT inhaler Inhale 2 puffs into the lungs every 4 (four) hours as needed for wheezing or shortness of breath.   ? Alcohol Swabs (DROPSAFE ALCOHOL PREP) 70 % PADS USE 4 (FOUR) TIMES DAILY (Patient not taking: Reported on 08/12/2021)   ? allopurinol (ZYLOPRIM) 100 MG tablet Take 1/2 (one-half) tablet by mouth once daily   ? aspirin EC 81 MG tablet Take 1 tablet (81 mg total) by mouth daily.   ? bismuth subsalicylate (PEPTO BISMOL) 262  MG/15ML suspension Take 30 mLs by mouth every 6 (six) hours as needed for indigestion. (Patient not taking: Reported on 08/12/2021)   ? carvedilol (COREG) 6.25 MG tablet Take 1 tablet (6.25 mg total) by mouth 2 (two) times daily with a meal.   ? colchicine (COLCRYS) 0.6 MG tablet Take 2 tabs once and then one tab one hour later as needed for gout   ? glipiZIDE (GLUCOTROL XL) 2.5 MG 24 hr tablet Take 1 tablet (2.5 mg total) by mouth daily with breakfast.   ? hydrALAZINE (APRESOLINE) 50 MG tablet Take 2 tablets (100 mg total) by mouth 3 (three) times daily.   ? isosorbide mononitrate (IMDUR) 60 MG 24 hr tablet Take 2 tablets (120 mg total) by mouth daily.   ? JARDIANCE 10 MG TABS tablet Take 1 tablet (10 mg total) by mouth every morning.   ? nitroGLYCERIN (NITROSTAT) 0.4 MG SL tablet Place 0.4 mg under the tongue every 5 (five) minutes as needed for chest pain.   ? oxyCODONE-acetaminophen (PERCOCET) 5-325 MG tablet Take 1 tablet by mouth every 4 (four) hours as needed for severe pain.   ? rosuvastatin (CRESTOR) 40 MG tablet Take 1 tablet (40 mg total) by mouth daily.   ? sitaGLIPtin (JANUVIA) 25 MG tablet Take 1 tablet (25 mg total) by mouth daily.   ? Tafamidis (VYNDAMAX) 61 MG CAPS Take 61 mg by mouth daily. 06/07/2021: New medication awaiting arrival from specialty pharmacy  ? torsemide (DEMADEX) 20 MG tablet Take 1 tablet (20 mg total) by mouth 2 (two) times a week.  Mondays and Fridays (Patient taking differently: Take 20 mg by mouth every Monday, Wednesday, and Friday.)   ? TRUEplus Lancets 33G MISC USE 4 (FOUR) TIMES DAILY (Patient not taking: Reported on 08/12/2021)   ? ?No facility-administered encounter medications on file as of 09/21/2021.  ? ?Patient Active Problem List  ? Diagnosis Date Noted  ? Pure hypercholesterolemia 07/05/2021  ? Preoperative cardiovascular examination 04/04/2021  ? HFrEF (heart failure with reduced ejection fraction) ([place]) 04/03/2021  ? Bilateral hearing loss 01/04/2021  ? Bilateral  impacted cerumen 01/04/2021  ? Tympanic membrane perforation, left 01/04/2021  ? Physical deconditioning 10/16/2020  ? Hereditary cardiac amyloidosis ([place]) 09/30/2020  ? 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 Progressive angina ([place]) - Atypical; DOE 09/20/2020  ? CAD (coronary artery disease) 09/20/2020  ? Acute on chronic combined systolic and diastolic CHF (congestive heart failure) ([place]) 09/20/2020  ? Acute combined systolic and diastolic heart failure ([place]) 09/20/2020  ? Soft tissue mass 07/30/2020  ? Contracture of joint of finger of left hand 07/30/2020  ? Acute respiratory disease due to COVID-19 virus 06/05/2019  ? DOE (dyspnea on exertion) 03/01/2019  ? Mild cognitive impairment 12/05/2018  ? Slurred speech 07/23/2018  ? Muscle twitching 07/23/2018  ? OSA (obstructive sleep apnea) 03/27/2018  ? Snores 01/21/2018  ? Poor balance 01/23/2017  ? Ankle swelling, left 09/06/2016  ? Bradycardia 01/28/2016  ? Weak urinary stream 12/31/2015  ? Stage 3b chronic kidney disease 09/15/2015  ? Ventral hernia without obstruction or gangrene 09/15/2015  ? Prostate cancer ([place]) 07/25/2015  ? Erectile dysfunction 05/04/2015  ? Urinary frequency 05/04/2015  ? 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 Abnormal finding on MRI of brain 02/14/2015  ? PEA (Pulseless electrical activity) ([place]) 12/03/2012  ? Diabetes ([place]) 06/30/2011  ? Gout 03/05/2007  ? Essential hypertension 03/05/2007  ? ?Conditions to be addressed/monitored:  CHF, DMII, and CKD ? ?Care Plan : RN Care Manager Plan of Care  ?Updates made by Knox Royalty, RN since 09/21/2021 12:00 AM  ?  ? ?Problem: Chronic Disease Management Needs   ?Priority: High  ?  ? ?Long-Range Goal: Ongoing adherence to established plan of care for long term chronic disease management   ?Start Date: 05/12/2021  ?Expected End Date: 05/12/2022  ?Priority: High  ?Note:   ?Current Barriers:  ?Chronic Disease Management support and education needs related to CHF  and DMII ?Unable to independently manage medications: reports decreased vision due to glaucoma; wife and son assists in medication management ?Limited health literacy, hard of hearing in (L) ear: wife participates in all of patient medical affairs and is very supportive/ helpful ?Challenges following low salt diet ? ?RNCM Clinical Goal(s):  ?Patient will demonstrate ongoing health management independence as evidenced by DMII; CHF/ CKD- III        through collaboration with RN Care manager, provider, and care team.  ? ?Interventions: ?1:1 collaboration with primary care provider regarding development and update of comprehensive plan of care as evidenced by provider attestation and co-signature ?Inter-disciplinary care team collaboration (see longitudinal plan of care) ?Evaluation of current treatment plan related to  self management and patient's adherence to plan as established by provider ?Review of patient status, including review of consultants reports, relevant laboratory and other test results, and medications completed ? ?09/21/21: Acute/ unscheduled outreach, incoming call from patient's spouse/ caregiver, [name], she left voice message earlier this afternoon requesting call-back; returned call as requested ?Reports patient took a walk yesterday in hot weather and  since then, has had ongoing increased lower extremity swelling in bilateral feet, associated with minimal / slight but increased shortness of breath ?Confirmed patient continues taking torsemide as prescribed M-W-F  ?Reports that patient's cardiology provider, "Richardson Dopp" had previously instructed them to take "an extra" dose of his diuretic when he had this problem, "awhile back;" she is asking me if it is okay for patient to take an "extra" dose of his torsemide, as he has done in the past when he has increased shortness breath associated with increased lower extremity swelling-- reviewed EHR; unable to easily locate cardiology instructions for  in-home "extra" dosing parameters for yellow CHF zone ?Confirmed caregiver has not reached out to cardiology provider for specific instruction around today's report ?Confirmed that patient took his torsemide

## 2021-09-21 NOTE — Telephone Encounter (Signed)
-----   Message from Knox Royalty, RN sent at 09/21/2021  5:06 PM EDT ----- ?Hi Corey [name], hope you are well ?Had an acute call just now with patient today re: increased LE swelling associated with increased SOB ?Patient/ spouse wanted to know if they should take an extra torsemide, said you had advised in the past to give extra dose when this happened before ?I explained I am not a doc and can't prescribe-- but told them I would support that decision if they chose to do so, given their report that you had okay'd in the past-- also advised that if he has ongoing issues around these symptoms to please reach out to your office first and directly ?[name] said she would decide tomorrow whether or not to give the extra dose-- just wanted to make sure you were made aware and in the loop ?Thanks, ?[name], RN, BSN, CCRN Alumnus ?[place] ?([phone]: direct office ? ? ? ?

## 2021-09-21 NOTE — Telephone Encounter (Signed)
Please call patient. ?Ok to take an extra Torsemide 20 mg for increased swelling, shortness of breath. ?If symptoms do not improve or worsen, arrange appt with Dr. Marlou Porch or APP. ?Richardson Dopp, PA-C    ?09/21/2021 5:34 PM   ?

## 2021-09-22 NOTE — Telephone Encounter (Signed)
Spoke to pt's wife. ?Advised of recommendation, agreeable to plan. ?She appreciates the follow up on this. ?

## 2021-09-27 ENCOUNTER — Telehealth: Payer: Self-pay | Admitting: Cardiology

## 2021-09-27 ENCOUNTER — Ambulatory Visit: Payer: Medicare HMO | Admitting: *Deleted

## 2021-09-27 DIAGNOSIS — I5040 Unspecified combined systolic (congestive) and diastolic (congestive) heart failure: Secondary | ICD-10-CM

## 2021-09-27 DIAGNOSIS — E1165 Type 2 diabetes mellitus with hyperglycemia: Secondary | ICD-10-CM

## 2021-09-27 NOTE — Patient Instructions (Signed)
Visit Information ? ?Corey [name] and [place], thank you for taking time to talk with me today. Please don't hesitate to contact me if I can be of assistance to you before our next scheduled telephone appointment ? ?Below are the goals we discussed today:  ?Patient Self-Care Activities: ?Patient [place] will: ?Take medications as prescribed ?Attend all scheduled provider appointments ?Call pharmacy for medication refills ?Call provider office for new concerns or questions ?Continue to check fasting (first thing in the morning, before eating) blood sugars at home: the blood sugars you reported today are all in very good range ?Try to occasionally check after-meal blood sugars, 2 hours after eating a regular size meal ?Continue to follow heart healthy, low salt, low cholesterol, carbohydrate-modified, low sugar diet ?Continue to monitor and record weights at home each day: Today you reported a weight of 170 lb ?Call office if I gain more than 2 pounds in one day or 5 pounds in one week, especially if this is associated with chest tightness, swelling in your legs/ ankles, and new or worsened shortness of breath ?Watch for swelling in feet, ankles and legs every day ?Keep legs up while sitting, and continue walking for exercise when the weather is nice outside ?Keep limiting the salt in your diet: please decrease your salt intake and do not add salt to your meals ?Keep up the great work preventing falls ?Read over the enclosed information about signs/ symptoms of fluid retention and what you should do if you experience these signs/ symptoms ? ?Our next scheduled telephone follow up visit/ appointment is scheduled on: Monday, November 14, 2021 at 3:00 pm- This is a PHONE CALL appointment ? ?If you need to cancel or re-schedule our visit, please call [phone] and our care guide team will be happy to assist you. ?  ?I look forward to hearing about your progress. ?  ?[name], RN, BSN, CCRN Alumnus ?[place] ?([phone]: direct office ? ?If you are experiencing a Mental Health or [place] or need someone to talk to, please  ?call the Suicide and Crisis Lifeline: 988 ?call the [place] National Suicide Prevention Lifeline: 820 110 0923 or TTY: 318-049-5122 TTY 3360065416) to talk to a trained counselor ?call 1-800-273-TALK (toll free, 24 hour hotline) ?go to [facility] [address], [place] [phone]) ?call 911  ? ?The patient verbalized understanding of instructions, educational materials, and care plan provided today and agreed to receive a mailed copy of patient instructions, educational materials, and care plan ? ?Heart Failure Action Plan ?A heart failure action plan helps you understand what to do when you have symptoms of heart failure. Your action plan is a color-coded plan that lists the symptoms to watch for and indicates what actions to take. ?If you have symptoms in the red zone, you need medical care right away. ?If you have symptoms in the yellow zone, you are having problems. ?If you have symptoms in the green zone, you are doing well. ?Follow the plan that was created by you and your health care provider. Review your plan each time you visit your health care provider. ?Red zone ?These signs and symptoms mean you should get medical help right away: ?You have trouble breathing when resting. ?You have a dry cough that is getting worse. ?You have swelling or pain in your legs or abdomen that is getting worse. ?You suddenly gain more than 2-3 lb (0.9-1.4 kg) in 24 hours, or more  than 5 lb (2.3 kg) in a week. This amount may be more or less depending on your condition. ?You have trouble staying awake or you feel confused. ?You have chest pain. ?You do not have an appetite. ?You pass out. ?You have worsening sadness or depression. ?If you have any of these symptoms, call your local emergency services (911 in the  [place] right away. Do not drive yourself to the hospital. ?Yellow zone ?These signs and symptoms mean your condition may be getting worse and you should make some changes: ?You have trouble breathing when you are active, or you need to sleep with your head raised on extra pillows to help you breathe. ?You have swelling in your legs or abdomen. ?You gain 2-3 lb (0.9-1.4 kg) in 24 hours, or 5 lb (2.3 kg) in a week. This amount may be more or less depending on your condition. ?You get tired easily. ?You have trouble sleeping. ?You have a dry cough. ?If you have any of these symptoms: ?Contact your health care provider within the next day. ?Your health care provider may adjust your medicines. ?Green zone ?These signs mean you are doing well and can continue what you are doing: ?You do not have shortness of breath. ?You have very little swelling or no new swelling. ?Your weight is stable (no gain or loss). ?You have a normal activity level. ?You do not have chest pain or any other new symptoms. ?Follow these instructions at home: ?Take over-the-counter and prescription medicines only as told by your health care provider. ?Weigh yourself daily. Your target weight is __________ lb (__________ kg). ?Call your health care provider if you gain more than __________ lb (__________ kg) in 24 hours, or more than __________ lb (__________ kg) in a week. ?Health care provider name: _____________________________________________________ ?Health care provider phone number: _____________________________________________________ ?Eat a heart-healthy diet. Work with a diet and nutrition specialist (dietitian) to create an eating plan that is best for you. ?Keep all follow-up visits. This is important. ?Where to find more information ?American Heart Association: www.heart.org ?Summary ?A heart failure action plan helps you understand what to do when you have symptoms of heart failure. ?Follow the action plan that was created by you and your  health care provider. ?Get help right away if you have any symptoms in the red zone. ?This information is not intended to replace advice given to you by your health care provider. Make sure you discuss any questions you have with your health care provider. ?Document Revised: 12/15/2019 Document Reviewed: 12/15/2019 ?Elsevier Patient Education ? [place]. ? ? ?

## 2021-09-27 NOTE — Telephone Encounter (Signed)
Pt c/o swelling: STAT is pt has developed SOB within 24 hours ? ?If swelling, where is the swelling located? Feet ? ?How much weight have you gained and in what time span? 160 to 175 - 2 week span ? ?Have you gained 3 pounds in a day or 5 pounds in a week? 5 pounds ? ?Do you have a log of your daily weights (if so, list)? NA ? ?Are you currently taking a fluid pill? Yes ? ?Are you currently SOB? No ? ?Have you traveled recently? No ? ? ?Pt c/o Shortness Of Breath: STAT if SOB developed within the last 24 hours or pt is noticeably SOB on the phone ? ?1. Are you currently SOB (can you hear that pt is SOB on the phone)? No ? ?2. How long have you been experiencing SOB? 1 month ? ?3. Are you SOB when sitting or when up moving around? Moving around walking ? ?4. Are you currently experiencing any other symptoms? No ? ? ? ?

## 2021-09-27 NOTE — Chronic Care Management (AMB) (Signed)
?Chronic Care Management  ? ?CCM RN Visit Note ? ?09/27/2021 ?Name: Corey [name] MRN: [id number] DOB: [birth year] ? ?Subjective: ?Corey [name] is a 76 y.o. year old male who is a primary care patient of Burns, [name], MD. The care management team was consulted for assistance with disease management and care coordination needs.   ? ?Engaged with patient and his caregiver/ spouse Corey [name], on [facility] DPR by telephone for follow up visit in response to provider referral for case management and/or care coordination services.  ? ?Consent to Services:  ?The patient was given information about Chronic Care Management services, agreed to services, and gave verbal consent prior to initiation of services.  Please see initial visit note for detailed documentation.  ?Patient agreed to services and verbal consent obtained.  ? ?Assessment: Review of patient past medical history, allergies, medications, health status, including review of consultants reports, laboratory and other test data, was performed as part of comprehensive evaluation and provision of chronic care management services.  ? ?SDOH (Social Determinants of Health) assessments and interventions performed:  ?SDOH Interventions   ? ?Flowsheet Row Most Recent Value  ?SDOH Interventions   ?Transportation Interventions Intervention Not Indicated  [family continues to provide transportation]  ? ?  ?CCM Care Plan ? ?Allergies  ?Allergen Reactions  ? Hydrochlorothiazide Other (See Comments)  ?  Gout , uncontrolled diabetes and renal insufficiency  ? ? ?Outpatient Encounter Medications as of 09/27/2021  ?Medication Sig Note  ? ACCU-CHEK AVIVA PLUS test strip TEST BLOOD SUGAR TWICE DAILY AS DIRECTED   ? ACCU-CHEK AVIVA PLUS test strip Use as instructed   ? albuterol (VENTOLIN HFA) 108 (90 Base) MCG/ACT inhaler Inhale 2 puffs into the lungs every 4 (four) hours as needed for wheezing or shortness of breath.   ? Alcohol Swabs (DROPSAFE ALCOHOL PREP) 70 % PADS USE 4 (FOUR) TIMES DAILY  (Patient not taking: Reported on 08/12/2021)   ? allopurinol (ZYLOPRIM) 100 MG tablet Take 1/2 (one-half) tablet by mouth once daily   ? aspirin EC 81 MG tablet Take 1 tablet (81 mg total) by mouth daily.   ? bismuth subsalicylate (PEPTO BISMOL) 262 MG/15ML suspension Take 30 mLs by mouth every 6 (six) hours as needed for indigestion. (Patient not taking: Reported on 08/12/2021)   ? carvedilol (COREG) 6.25 MG tablet Take 1 tablet (6.25 mg total) by mouth 2 (two) times daily with a meal.   ? colchicine (COLCRYS) 0.6 MG tablet Take 2 tabs once and then one tab one hour later as needed for gout   ? glipiZIDE (GLUCOTROL XL) 2.5 MG 24 hr tablet Take 1 tablet (2.5 mg total) by mouth daily with breakfast.   ? hydrALAZINE (APRESOLINE) 50 MG tablet Take 2 tablets (100 mg total) by mouth 3 (three) times daily.   ? isosorbide mononitrate (IMDUR) 60 MG 24 hr tablet Take 2 tablets (120 mg total) by mouth daily.   ? JARDIANCE 10 MG TABS tablet Take 1 tablet (10 mg total) by mouth every morning.   ? nitroGLYCERIN (NITROSTAT) 0.4 MG SL tablet Place 0.4 mg under the tongue every 5 (five) minutes as needed for chest pain.   ? oxyCODONE-acetaminophen (PERCOCET) 5-325 MG tablet Take 1 tablet by mouth every 4 (four) hours as needed for severe pain.   ? rosuvastatin (CRESTOR) 40 MG tablet Take 1 tablet (40 mg total) by mouth daily.   ? sitaGLIPtin (JANUVIA) 25 MG tablet Take 1 tablet (25 mg total) by mouth daily.   ? Tafamidis (  VYNDAMAX) 61 MG CAPS Take 61 mg by mouth daily. 06/07/2021: New medication awaiting arrival from specialty pharmacy  ? torsemide (DEMADEX) 20 MG tablet Take 1 tablet (20 mg total) by mouth 2 (two) times a week. Mondays and Fridays (Patient taking differently: Take 20 mg by mouth every Monday, Wednesday, and Friday.)   ? TRUEplus Lancets 33G MISC USE 4 (FOUR) TIMES DAILY (Patient not taking: Reported on 08/12/2021)   ? ?No facility-administered encounter medications on file as of 09/27/2021.  ? ?Patient Active Problem  List  ? Diagnosis Date Noted  ? Pure hypercholesterolemia 07/05/2021  ? Preoperative cardiovascular examination 04/04/2021  ? HFrEF (heart failure with reduced ejection fraction) ([place]) 04/03/2021  ? Bilateral hearing loss 01/04/2021  ? Bilateral impacted cerumen 01/04/2021  ? Tympanic membrane perforation, left 01/04/2021  ? Physical deconditioning 10/16/2020  ? Hereditary cardiac amyloidosis ([place]) 09/30/2020  ? 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 Progressive angina ([place]) - Atypical; DOE 09/20/2020  ? CAD (coronary artery disease) 09/20/2020  ? Acute on chronic combined systolic and diastolic CHF (congestive heart failure) ([place]) 09/20/2020  ? Acute combined systolic and diastolic heart failure ([place]) 09/20/2020  ? Soft tissue mass 07/30/2020  ? Contracture of joint of finger of left hand 07/30/2020  ? Acute respiratory disease due to COVID-19 virus 06/05/2019  ? DOE (dyspnea on exertion) 03/01/2019  ? Mild cognitive impairment 12/05/2018  ? Slurred speech 07/23/2018  ? Muscle twitching 07/23/2018  ? OSA (obstructive sleep apnea) 03/27/2018  ? Snores 01/21/2018  ? Poor balance 01/23/2017  ? Ankle swelling, left 09/06/2016  ? Bradycardia 01/28/2016  ? Weak urinary stream 12/31/2015  ? Stage 3b chronic kidney disease 09/15/2015  ? Ventral hernia without obstruction or gangrene 09/15/2015  ? Prostate cancer ([place]) 07/25/2015  ? Erectile dysfunction 05/04/2015  ? Urinary frequency 05/04/2015  ? 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 Abnormal finding on MRI of brain 02/14/2015  ? PEA (Pulseless electrical activity) ([place]) 12/03/2012  ? Diabetes ([place]) 06/30/2011  ? Gout 03/05/2007  ? Essential hypertension 03/05/2007  ? ?Conditions to be addressed/monitored:  CHF and DMII ? ?Care Plan : RN Care Manager Plan of Care  ?Updates made by Knox Royalty, RN since 09/27/2021 12:00 AM  ?  ? ?Problem: Chronic Disease Management Needs   ?Priority: High  ?  ? ?Long-Range Goal: Ongoing adherence to  established plan of care for long term chronic disease management   ?Start Date: 05/12/2021  ?Expected End Date: 05/12/2022  ?Priority: High  ?Note:   ?Current Barriers:  ?Chronic Disease Management support and education needs related to CHF and DMII ?Unable to independently manage medications: reports decreased vision due to glaucoma; wife and son assists in medication management ?Limited health literacy, hard of hearing in (L) ear: wife participates in all of patient medical affairs and is very supportive/ helpful ?Challenges following low salt diet ? ?RNCM Clinical Goal(s):  ?Patient will demonstrate ongoing health management independence as evidenced by DMII; CHF/ CKD- III        through collaboration with RN Care manager, provider, and care team.  ? ?Interventions: ?1:1 collaboration with primary care provider regarding development and update of comprehensive plan of care as evidenced by provider attestation and co-signature ?Inter-disciplinary care team collaboration (see longitudinal plan of care) ?Evaluation of current treatment plan related to  self management and patient's adherence to plan as established by provider ?Review of patient status, including review of consultants reports, relevant laboratory and other test results, and  medications completed ?SDOH updated: no new/ unmet concerns identified ?Pain assessment updated: denies pain today ?Falls assessment updated: continues to deny new/ recent falls x 12 months- currently not using assistive devices;  positive reinforcement provided with encouragement to continue efforts at fall prevention; previously provided education around fall risks/ prevention reinforced ?Medications discussed: reports spouse continues to manage medications and they deny current concerns/ issues/ questions around medications; endorses adherence to taking all medications as prescribed ?Reviewed recent renal provider office visit: patient and spouse tell me that patient "got a  good report;" they deny post-visit changes to medications or to general plan of care; spouse said, "she told us everything was stable" ?Reviewed upcoming scheduled provider appointments: 10/14/21- endocrinology p

## 2021-09-27 NOTE — Telephone Encounter (Signed)
Called and spoke to wife Rebekah Chesterfield who states pt is noticeably short of breath. Initially stated that she could tell he was having difficulties breathing while talking, but then stated it only happened if he was moving around. Pt in background states he's "fine as long as I'm not moving around." Educated wife to watch for signs of labored breathing (excessive shoulder movement with inspiration, forward bending, excessive rate, or difficulty speaking full sentences) and go directly to hospital if so. Rebekah Chesterfield states that his L foot edema has improved, but R foot is swollen and he couldn't get his shoes on Sunday for church. Questioned wife about weight gain, she initially condoned a 15lb gain over 2 weeks, but had pt weigh while on phone and weight provided was 178.5lb (last recorded weight in chart was 175lb 12.8oz in March) which only reflects 3.5lb over unknown time. Scott weaver advised on 5/10: ? ?Ok to take an extra Torsemide 20 mg for increased swelling, shortness of breath. ?If symptoms do not improve or worsen, arrange appt with Dr. Marlou Porch or APP. ?Richardson Dopp, PA-C    ?09/21/2021 5:34 PM  ? ?Pt will again take additional dose and weigh himself tomorrow to see if he has gone down from the reading he gave today. Scheduled for next available with Dr Marlou Porch (he only wants to see him since "he knows my situation") as DOD on Thursday. ?

## 2021-09-29 ENCOUNTER — Encounter: Payer: Self-pay | Admitting: Cardiology

## 2021-09-29 ENCOUNTER — Ambulatory Visit: Payer: Medicare HMO | Admitting: Cardiology

## 2021-09-29 VITALS — BP 100/60 | HR 52 | Ht 66.0 in | Wt 176.0 lb

## 2021-09-29 DIAGNOSIS — I5020 Unspecified systolic (congestive) heart failure: Secondary | ICD-10-CM | POA: Diagnosis not present

## 2021-09-29 DIAGNOSIS — E854 Organ-limited amyloidosis: Secondary | ICD-10-CM

## 2021-09-29 DIAGNOSIS — E8581 Light chain (AL) amyloidosis: Secondary | ICD-10-CM | POA: Diagnosis not present

## 2021-09-29 DIAGNOSIS — S81801S Unspecified open wound, right lower leg, sequela: Secondary | ICD-10-CM | POA: Diagnosis not present

## 2021-09-29 DIAGNOSIS — E1122 Type 2 diabetes mellitus with diabetic chronic kidney disease: Secondary | ICD-10-CM | POA: Diagnosis not present

## 2021-09-29 DIAGNOSIS — I43 Cardiomyopathy in diseases classified elsewhere: Secondary | ICD-10-CM | POA: Diagnosis not present

## 2021-09-29 DIAGNOSIS — N184 Chronic kidney disease, stage 4 (severe): Secondary | ICD-10-CM | POA: Insufficient documentation

## 2021-09-29 DIAGNOSIS — N1832 Chronic kidney disease, stage 3b: Secondary | ICD-10-CM | POA: Diagnosis not present

## 2021-09-29 DIAGNOSIS — I5042 Chronic combined systolic (congestive) and diastolic (congestive) heart failure: Secondary | ICD-10-CM | POA: Diagnosis not present

## 2021-09-29 DIAGNOSIS — Z79899 Other long term (current) drug therapy: Secondary | ICD-10-CM | POA: Diagnosis not present

## 2021-09-29 DIAGNOSIS — I129 Hypertensive chronic kidney disease with stage 1 through stage 4 chronic kidney disease, or unspecified chronic kidney disease: Secondary | ICD-10-CM | POA: Diagnosis not present

## 2021-09-29 MED ORDER — TORSEMIDE 20 MG PO TABS: 20.0000 mg | ORAL_TABLET | Freq: Every day | ORAL | 1 refills | Status: DC

## 2021-09-29 MED ORDER — CARVEDILOL 3.125 MG PO TABS: 3.1250 mg | ORAL_TABLET | Freq: Two times a day (BID) | ORAL | 3 refills | Status: DC

## 2021-09-29 NOTE — Assessment & Plan Note (Signed)
He has had approximately 15 pound weight gain.  Lower extremity edema noted.  He is short of breath.  We will pull back his carvedilol to 3.125 mg twice a day since his blood pressure is 100/60.  We will stop his isosorbide 120 mg.  His Vania Rea is now off by nephrology.  Excellent.  Last creatinine in April was 4.6.  Prior to that 3.5.  Prior to that 2.1.  He is clearly with fluid overload.  We will attempt to increase his torsemide from 20 mg Monday Wednesday Friday to 40 mg daily until he is down 15 pounds.  At that point he can go back to his Monday Wednesday Friday 20 mg.  We will check a basic metabolic profile today.  I will see him back in clinic on Tuesday, May 30.  If this is not successful whether it be from a diuresis standpoint, renal failure standpoint etc., he may require hospitalization.  We discussed this with [name] and his wife.

## 2021-09-29 NOTE — Assessment & Plan Note (Signed)
Currently taking tafamidis.  Appreciate Dr. Kathalene Frames assistance.

## 2021-09-29 NOTE — Assessment & Plan Note (Signed)
Recently saw nephrology.  There was discussion of potential dialysis in the future.

## 2021-09-29 NOTE — Progress Notes (Signed)
Cardiology Office Note:    Date:  09/29/2021   ID:  Corey [name], DOB July 04, 1945, MRN [id number]  PCP:  [name], MD   [facility] Providers Cardiologist:  Candee Furbish, MD     Referring MD: [name], MD    History of Present Illness:    Corey [name] is a 76 y.o. male here for the follow-up of shortness of breath and a 3.5 lb weight gain.  On 09/27/21 his wife called the office and stated he was noticeably short of breath. He was typically fine if not moving around. His wife reported a 15 lb gain over 2 weeks, but patient weight recorded while on phone was 178.5 lb (last weight in chart 175 lb in March) reflecting 3.5 lb gain over unknown time. Previously he had been advised by Richardson Dopp PA-C on 5/10 it was ok to take extra torsemide 20 mg for increased swelling, shortness of breath. He was going to take an additional dose and weigh himself the next day. He was scheduled for a DOD appointment today.  Previously here for cardiac amyloidosis on tafamidis with prior pericardial effusion status post window, CAD combined systolic and diastolic heart failure with prior PEA arrest hypertension hyperlipidemia diabetes type 2 stroke obstructive sleep apnea cognitive impairment and prostate cancer.  Back in May 2022 was having dyspnea on exertion and possible anginal symptoms.  He scheduled for a left and right heart cath.  During that admission he was found to have an 80% occlusion of his LAD with severely elevated left ventricular end-diastolic pressures.  Symptoms were probably more compatible with pressure volume overload and he was started on diuretics.  His echocardiogram on 09/2020 showed EF of 35 to 40%.  Had a large pericardial effusion at the time.  He underwent a pericardial window and biopsies were taken that were compatible with amyloidosis possible AL amyloid low doses.  However, serum and urine studies were not compatible with AL amyloidosis.  He then underwent PYP scan that  was equivocal.  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  Family history showed 2 brothers with heart failure 1 deceased 1 sister died from heart failure.  He has a son that is 45 years old without any issues.  He lost a significant amount of weight with diuresis.  His torsemide was decreased.  Weight has been steady at 156 pounds at last office visit with heart failure team.  PYP scan was delayed because of radiotracer shortage.  He underwent elective L inguinal hernia repair 06/16/21.  At his last appointment he was doing well with improvement in his breathing.He saw Dr. Audie Box for amyloidosis and started on tafamadis. He had been taking it for 3 weeks and tolerating it well.  His wife noticed occasional bilateral LE edema. His L ankle tends to swell more than his R ankle. She will give him an extra Lasix when she notices the swelling.   Today: He is accompanied by a family member. He is not feeling good today due to shortness of breath and LE edema in his feet. There has not been much of a change on torsemide. Currently he is taking 20 mg torsemide on MWF.  Yesterday they tried an extra dose but noticed no improvement.  He is also dyspneic while trying to hold a conversation. He needs to stop and catch his breath.  When walking from here to the elevator he felt a little dizzy. His BP in clinic  today is 100/60.  They also note that he has been sleeping more lately.  His Jardiance was recently discontinued.  Hew denies any palpitations, or chest pain. No headaches, syncope, orthopnea, or PND.   Past Medical History:  Diagnosis Date   Cataract    CHF (congestive heart failure) ([place])    Chronic heart failure with preserved ejection fraction (HFpEF) ([place])    Colon polyp 2003   Dr [name]; F/U was to be 2008( not completed)   CVA (cerebral infarction) 2014   Diabetes mellitus    Diverticulosis 2003   Dyspnea    with exertion   Gout    Hypertension     Myocardial infarction ([place]) 2014   ?? PEA cardiac arrest   OSA (obstructive sleep apnea) 03/27/2018   Pneumonia 09/29/2011   Avelox X 10 days as OP   Prostate cancer ([place])    Renal insufficiency    Seizures ([place])    not treated for seizure disorder; had a seizure after stroke 2014; no seizure since then    Past Surgical History:  Procedure Laterality Date   COLONOSCOPY W/ POLYPECTOMY  2003   no F/U ([place] discussed 12/03/12)   INGUINAL HERNIA REPAIR Left 06/16/2021   Procedure: LAPAROSCOPIC, POSSIBLY OPEN LEFT INGUINAL HERNIA REPAIR WITH MESH;  Surgeon: Felicie Morn, MD;  Location: [facility];  Service: General;  Laterality: Left;   INSERTION OF MESH N/A 07/16/2017   Procedure: INSERTION OF MESH;  Surgeon: [name], MD;  Location: [place];  Service: General;  Laterality: N/A;   PEG PLACEMENT  2014   PEG TUBE REMOVAL  2014   PROSTATE BIOPSY     RIGHT/LEFT HEART CATH AND CORONARY ANGIOGRAPHY N/A 09/20/2020   Procedure: RIGHT/LEFT HEART CATH AND CORONARY ANGIOGRAPHY;  Surgeon: Leonie Man, MD;  Location: [facility];  Service: Cardiovascular;  Laterality: N/A;   TEE WITHOUT CARDIOVERSION N/A 09/27/2020   Procedure: TRANSESOPHAGEAL ECHOCARDIOGRAM (TEE);  Surgeon: Wonda Olds, MD;  Location: [facility] OR;  Service: Thoracic;  Laterality: N/A;   TRACHEOSTOMY  2014   was closed after hospital stay   [place] N/A 07/16/2017   Procedure: Pablo Pena;  Surgeon: [name], MD;  Location: [facility];  Service: General;  Laterality: N/A;   wrist aspiration  02/16/12    monosodium urate crystals; Dr Caralyn Guile    Current Medications: Current Meds  Medication Sig   ACCU-CHEK AVIVA PLUS test strip TEST BLOOD SUGAR TWICE DAILY AS DIRECTED   ACCU-CHEK AVIVA PLUS test strip Use as instructed   albuterol (VENTOLIN HFA) 108 (90 Base) MCG/ACT inhaler Inhale 2 puffs into the lungs every 4 (four) hours as needed for wheezing or shortness of  breath.   allopurinol (ZYLOPRIM) 100 MG tablet Take 1/2 (one-half) tablet by mouth once daily   aspirin EC 81 MG tablet Take 1 tablet (81 mg total) by mouth daily.   bismuth subsalicylate (PEPTO BISMOL) 262 MG/15ML suspension Take 30 mLs by mouth every 6 (six) hours as needed for indigestion.   carvedilol (COREG) 3.125 MG tablet Take 1 tablet (3.125 mg total) by mouth 2 (two) times daily.   colchicine (COLCRYS) 0.6 MG tablet Take 2 tabs once and then one tab one hour later as needed for gout   glipiZIDE (GLUCOTROL XL) 2.5 MG 24 hr tablet Take 1 tablet (2.5 mg total) by mouth daily with breakfast.   hydrALAZINE (APRESOLINE) 50 MG tablet Take 2 tablets (100 mg  total) by mouth 3 (three) times daily.   nitroGLYCERIN (NITROSTAT) 0.4 MG SL tablet Place 0.4 mg under the tongue every 5 (five) minutes as needed for chest pain.   rosuvastatin (CRESTOR) 40 MG tablet Take 1 tablet (40 mg total) by mouth daily.   sitaGLIPtin (JANUVIA) 25 MG tablet Take 1 tablet (25 mg total) by mouth daily.   Tafamidis (VYNDAMAX) 61 MG CAPS Take 61 mg by mouth daily.   torsemide (DEMADEX) 20 MG tablet Take 1 tablet (20 mg total) by mouth daily.   TRUEplus Lancets 33G MISC USE 4 (FOUR) TIMES DAILY   [DISCONTINUED] carvedilol (COREG) 6.25 MG tablet Take 1 tablet (6.25 mg total) by mouth 2 (two) times daily with a meal.   [DISCONTINUED] isosorbide mononitrate (IMDUR) 60 MG 24 hr tablet Take 2 tablets (120 mg total) by mouth daily.   [DISCONTINUED] torsemide (DEMADEX) 20 MG tablet Take 1 tablet (20 mg total) by mouth 2 (two) times a week. Mondays and Fridays     Allergies:   Hydrochlorothiazide   Social History   Socioeconomic History   Marital status: Married    Spouse name: [name]   Number of children: 2   Years of education: 12   Highest education level: 12th grade  Occupational History   Not on file  Tobacco Use   Smoking status: Former    Packs/day: 0.25    Years: 1.00    Pack years: 0.25    Types:  Cigarettes    Quit date: 05/15/1968    Years since quitting: 53.4   Smokeless tobacco: Never   Tobacco comments:    smoked [place], up to 1-2 cigarettes/ day  Vaping Use   Vaping Use: Never used  Substance and Sexual Activity   Alcohol use: No    Alcohol/week: 0.0 standard drinks   Drug use: No   Sexual activity: Yes  Other Topics Concern   Not on file  Social History Narrative   Walks three times a week, 2-3 miles, occasional walks on treadmill       Patient is right-handed. He lives with his wife in a one level home. He drinks 1 diet Mt. Dew a day and rarely has coffee. He walks 3 x a week for exercise.   Social Determinants of Health   Financial Resource Strain: Medium Risk   Difficulty of Paying Living Expenses: Somewhat hard  Food Insecurity: No Food Insecurity   Worried About Charity fundraiser in the Last Year: Never true   Ran Out of Food in the Last Year: Never true  Transportation Needs: No Transportation Needs   Lack of Transportation (Medical): No   Lack of Transportation (Non-Medical): No  Physical Activity: Not on file  Stress: Not on file  Social Connections: Not on file     Family History: The patient's family history includes Cancer in his father; Diabetes in his father and mother; Heart failure in his brother and mother; Hypertension in his father and mother; Prostate cancer in his father. There is no history of Stroke, Heart disease, or Colon cancer.  ROS:   Please see the history of present illness.   (+) Shortness of breath (+) LE edema (+) Dizziness All other systems reviewed and are negative.  EKGs/Labs/Other Studies Reviewed:    Myocardial Stress Test 12/31/20 Findings are equivocal for TTR amyloidosis.  Visual score is 1 and H/CLL ratio is 1.4.    Echo 09/30/20 1. Trace pericardial fluid posterior and around right AV groove. Compared  to prior study 09/21/20, pericardial effusion has essentially resolved.   2. Left ventricular ejection  fraction, by estimation, is 40%. The left  ventricle has moderately decreased function. The left ventricle  demonstrates global hypokinesis. There is severe left ventricular  hypertrophy.   3. Right ventricular systolic function is moderately reduced. The right  ventricular size is normal. Severely increased right ventricular wall  thickness.   4. Trivial mitral valve regurgitation.   5. The inferior vena cava is dilated in size with <50% respiratory  variability, suggesting right atrial pressure of 15 mmHg.   Echo 09/21/20 1. There is no left ventricular thrombus with Definity contrast. Left  ventricular ejection fraction, by estimation, is 35 to 40%. The left  ventricle has moderately decreased function. The left ventricle  demonstrates regional wall motion abnormalities  (see scoring diagram/findings for description). There is global left  ventricular hypokinesis, disproportionately severe in the mid-apical  anterior and anterolateral walls and apex. The anterior septum has  relatively preserved contractility. There is  severe concentric left ventricular hypertrophy. Left ventricular diastolic  parameters are consistent with Grade II diastolic dysfunction  (pseudonormalization). Elevated left atrial pressure.   2. Right ventricular systolic function is moderately reduced. The right  ventricular size is normal. Mildly increased right ventricular wall  thickness. There is moderately elevated pulmonary artery systolic  pressure.   3. Left atrial size was severely dilated.   4. Right atrial size was mildly dilated.   5. Large pericardial effusion. The pericardial effusion is  circumferential. There is no evidence of cardiac tamponade.   6. The mitral valve is normal in structure. Mild mitral valve  regurgitation.   7. The aortic valve is tricuspid. Aortic valve regurgitation is not  visualized. No aortic stenosis is present.   8. The inferior vena cava is dilated in size with <50%  respiratory  variability, suggesting right atrial pressure of 15 mmHg.   Comparison(s): The left ventricular function is significantly worse. The  left ventricular wall motion abnormality is worse. The pericardial  effusion is slightly larger. There are no overt findings to support  tamponade, other than inferior vena cava  dilation. However, the sensitivity of echo for the diagnosis of  pericardial tamponade is diminished in the setting of pulmonary artery  hypertension.   R/L Cath 09/20/20 Hemodynamic findings consistent with moderate pulmonary hypertension. LV end diastolic pressure is severely elevated. There is no aortic valve stenosis. Mid LAD to Dist LAD lesion is 80% stenosed. Ramus lesion is 20% stenosed.   SUMMARY Severe single-vessel disease with heavily calcified mid LAD 80% stenosis Acute Combined Systolic and Diastolic Heart Failure (systolic function appears to be reduced, to conserve contrast, LV gram not performed), but LVEDP 32 mmHg and PCWP 28 mmHg. Systemic Hypertension with Mean Arterial Pressure 123 mmHg Secondary Pulmonary Hypertension: PAP 67/19 mmHg-mean 39 mmHg with RVP-EDP of 67/2 mmHg-15 mmHg, RVP and RAP of 14 mmHg. Moderate to severely reduced Cardiac Output-Index: 4.04-2.09   RECOMMENDATIONS With significant CHF, orthopnea and PND with LVEDP of 32 mmHg, patient is not stable for LAD PCI which would likely require atherectomy. We will admit to cardiology service for titration of cardiac meds and diuresis: Converting losartan to [facility], otherwise continue home medications. IV Lasix 40 mg twice daily Check 2D echo Reassess volume status in the next 1 to 2 days to determine feasibility for staged LAD PCI (via femoral access due to extreme tortuosity of the innominate artery.)   Slight scale insulin; consider adding [place] or [place]  EKG:  EKG is personally reviewed. 09/29/2021: EKG was not ordered.   Recent Labs: 02/07/2021: ALT 51 06/10/2021:  Hemoglobin 13.4; Platelets 127 08/09/2021: BUN 60; Creatinine, Ser 3.26; Potassium 3.5; Sodium 143   Recent Lipid Panel    Component Value Date/Time   CHOL 88 11/23/2020 1056   TRIG 64.0 11/23/2020 1056   HDL 41.90 11/23/2020 1056   CHOLHDL 2 11/23/2020 1056   VLDL 12.8 11/23/2020 1056   LDLCALC 34 11/23/2020 1056   LDLCALC 61 01/28/2020 1000     Risk Assessment/Calculations:          Physical Exam:    VS:  BP 100/60 (BP Location: Right Arm, Patient Position: Sitting, Cuff Size: Normal)   Pulse (!) 52   Ht '5\' 6"'$  (1.676 m)   Wt 176 lb (79.8 kg)   SpO2 98%   BMI 28.41 kg/m     Wt Readings from Last 3 Encounters:  09/29/21 176 lb (79.8 kg)  08/12/21 175 lb 12.8 oz (79.7 kg)  07/05/21 164 lb (74.4 kg)     GEN:  Well nourished, well developed in no acute distress HEENT: Normal NECK: No JVD; No carotid bruits LYMPHATICS: No lymphadenopathy CARDIAC: RRR, no murmurs, rubs, gallops RESPIRATORY:  Clear to auscultation without rales, wheezing or rhonchi  ABDOMEN: Soft, non-tender, non-distended MUSCULOSKELETAL:  + LE edema bilaterally; No deformity  SKIN: Mild redness noted on right pretibial region, no significant erythema.  Hyperpigmentation noted.  No oozing. NEUROLOGIC:  Alert and oriented x 3 PSYCHIATRIC:  Normal affect   ASSESSMENT:    1. Medication management   2. HFrEF (heart failure with reduced ejection fraction) ([place])   3. Hereditary cardiac amyloidosis ([place])   4. Wound of right lower extremity, sequela      PLAN:    Acute combined systolic and diastolic heart failure ([place]) He has had approximately 15 pound weight gain.  Lower extremity edema noted.  He is short of breath.  We will pull back his carvedilol to 3.125 mg twice a day since his blood pressure is 100/60.  We will stop his isosorbide 120 mg.  His Vania Rea is now off by nephrology.  Excellent.  Last creatinine in April was 4.6.  Prior to that 3.5.  Prior to that 2.1.  He is clearly with fluid  overload.  We will attempt to increase his torsemide from 20 mg Monday Wednesday Friday to 40 mg daily until he is down 15 pounds.  At that point he can go back to his Monday Wednesday Friday 20 mg.  We will check a basic metabolic profile today.  I will see him back in clinic on Tuesday, May 30.  If this is not successful whether it be from a diuresis standpoint, renal failure standpoint etc., he may require hospitalization.  We discussed this with [name] and his wife.  Chronic kidney disease (CKD), stage IV (severe) (HCC) Recently saw nephrology.  There was discussion of potential dialysis in the future.  Hereditary cardiac amyloidosis (HCC) Currently taking tafamidis.  Appreciate Dr. Kathalene Frames assistance.  Leg wound, right Wife is worried because left leg wound in similar position became quite severe previously.  She has been placing Neosporin on this region.  I have asked him to address this with his previous wound doctor, perhaps starting with Dr. Quay Burow if necessary.    Follow-up in 1-2 weeks.   Medication Adjustments/Labs and Tests Ordered: Current medicines are reviewed at length with the patient today.  Concerns regarding medicines are outlined above.  Orders Placed This Encounter  Procedures   Basic metabolic panel   Meds ordered this encounter  Medications   carvedilol (COREG) 3.125 MG tablet    Sig: Take 1 tablet (3.125 mg total) by mouth 2 (two) times daily.    Dispense:  180 tablet    Refill:  3   torsemide (DEMADEX) 20 MG tablet    Sig: Take 1 tablet (20 mg total) by mouth daily.    Dispense:  90 tablet    Refill:  1    Pt does not need to fill at this time-hold to file   Patient Instructions  Medication Instructions:  Please increase Torsemide to 20 mg every day until back to normal weight, then go back to every other day. Decrease Carvedilol to 3.125 mg twice a day. Stop your Isosorbide. Continue all other medications as listed.  *If you need a refill on your  cardiac medications before your next appointment, please call your pharmacy*  Lab Work: Please have blood work today (BMP)  If you have labs (blood work) drawn today and your tests are completely normal, you will receive your results only by: MyChart Message (if you have MyChart) OR A paper copy in the mail If you have any lab test that is abnormal or we need to change your treatment, we will call you to review the results.  Follow-Up: At [facility], you and your health needs are our priority.  As part of our continuing mission to provide you with exceptional heart care, we have created designated Provider Care Teams.  These Care Teams include your primary Cardiologist (physician) and Advanced Practice Providers (APPs -  Physician Assistants and Nurse Practitioners) who all work together to provide you with the care you need, when you need it.  We recommend signing up for the patient portal called "MyChart".  Sign up information is provided on this After Visit Summary.  MyChart is used to connect with patients for Virtual Visits (Telemedicine).  Patients are able to view lab/test results, encounter notes, upcoming appointments, etc.  Non-urgent messages can be sent to your provider as well.   To learn more about what you can do with MyChart, go to [email or web address].    Your next appointment:   As scheduled May 30  The format for your next appointment:   In Person  Provider:   Candee Furbish, MD {    Important Information About Sugar         I,Mathew Stumpf,acting as a scribe for Candee Furbish, MD.,have documented all relevant documentation on the behalf of Candee Furbish, MD,as directed by  Candee Furbish, MD while in the presence of Candee Furbish, MD.  I, Candee Furbish, MD, have reviewed all documentation for this visit. The documentation on 09/29/21 for the exam, diagnosis, procedures, and orders are all accurate and complete.   Signed, Candee Furbish, MD  09/29/2021 4:25 PM    [facility]

## 2021-09-29 NOTE — Patient Instructions (Signed)
Medication Instructions:  Please increase Torsemide to 20 mg every day until back to normal weight, then go back to every other day. Decrease Carvedilol to 3.125 mg twice a day. Stop your Isosorbide. Continue all other medications as listed.  *If you need a refill on your cardiac medications before your next appointment, please call your pharmacy*  Lab Work: Please have blood work today (BMP)  If you have labs (blood work) drawn today and your tests are completely normal, you will receive your results only by: MyChart Message (if you have MyChart) OR A paper copy in the mail If you have any lab test that is abnormal or we need to change your treatment, we will call you to review the results.  Follow-Up: At [facility], you and your health needs are our priority.  As part of our continuing mission to provide you with exceptional heart care, we have created designated Provider Care Teams.  These Care Teams include your primary Cardiologist (physician) and Advanced Practice Providers (APPs -  Physician Assistants and Nurse Practitioners) who all work together to provide you with the care you need, when you need it.  We recommend signing up for the patient portal called "MyChart".  Sign up information is provided on this After Visit Summary.  MyChart is used to connect with patients for Virtual Visits (Telemedicine).  Patients are able to view lab/test results, encounter notes, upcoming appointments, etc.  Non-urgent messages can be sent to your provider as well.   To learn more about what you can do with MyChart, go to [email or web address].    Your next appointment:   As scheduled May 30  The format for your next appointment:   In Person  Provider:   Candee Furbish, MD {    Important Information About Sugar

## 2021-09-29 NOTE — Assessment & Plan Note (Signed)
Wife is worried because left leg wound in similar position became quite severe previously.  She has been placing Neosporin on this region.  I have asked him to address this with his previous wound doctor, perhaps starting with Dr. Quay Burow if necessary.

## 2021-09-30 LAB — BASIC METABOLIC PANEL
BUN/Creatinine Ratio: 19 (ref 10–24)
BUN: 66 mg/dL — ABNORMAL HIGH (ref 8–27)
CO2: 19 mmol/L — ABNORMAL LOW (ref 20–29)
Calcium: 9.8 mg/dL (ref 8.6–10.2)
Chloride: 108 mmol/L — ABNORMAL HIGH (ref 96–106)
Creatinine, Ser: 3.41 mg/dL — ABNORMAL HIGH (ref 0.76–1.27)
Glucose: 89 mg/dL (ref 70–99)
Potassium: 4 mmol/L (ref 3.5–5.2)
Sodium: 143 mmol/L (ref 134–144)
eGFR: 18 mL/min/{1.73_m2} — ABNORMAL LOW (ref >59)

## 2021-10-03 ENCOUNTER — Other Ambulatory Visit: Payer: Self-pay | Admitting: Internal Medicine

## 2021-10-11 ENCOUNTER — Encounter: Payer: Self-pay | Admitting: Cardiology

## 2021-10-11 ENCOUNTER — Ambulatory Visit (INDEPENDENT_AMBULATORY_CARE_PROVIDER_SITE_OTHER): Payer: Medicare HMO | Admitting: Cardiology

## 2021-10-11 ENCOUNTER — Other Ambulatory Visit: Payer: Medicare HMO

## 2021-10-11 VITALS — BP 119/66 | HR 40 | Ht 66.0 in | Wt 167.0 lb

## 2021-10-11 DIAGNOSIS — Z79899 Other long term (current) drug therapy: Secondary | ICD-10-CM

## 2021-10-11 DIAGNOSIS — E854 Organ-limited amyloidosis: Secondary | ICD-10-CM | POA: Diagnosis not present

## 2021-10-11 DIAGNOSIS — I5020 Unspecified systolic (congestive) heart failure: Secondary | ICD-10-CM | POA: Diagnosis not present

## 2021-10-11 DIAGNOSIS — N184 Chronic kidney disease, stage 4 (severe): Secondary | ICD-10-CM

## 2021-10-11 DIAGNOSIS — E1165 Type 2 diabetes mellitus with hyperglycemia: Secondary | ICD-10-CM

## 2021-10-11 DIAGNOSIS — I5041 Acute combined systolic (congestive) and diastolic (congestive) heart failure: Secondary | ICD-10-CM

## 2021-10-11 DIAGNOSIS — N1832 Chronic kidney disease, stage 3b: Secondary | ICD-10-CM

## 2021-10-11 DIAGNOSIS — I43 Cardiomyopathy in diseases classified elsewhere: Secondary | ICD-10-CM

## 2021-10-11 NOTE — Assessment & Plan Note (Signed)
Checking basic metabolic profile today.  Previously creatinine in the 3.5 range.

## 2021-10-11 NOTE — Patient Instructions (Signed)
Medication Instructions:  Please discontinue your Carvedilol Continue Torsemide 20 mg once a day. Continue all other medications as listed.  *If you need a refill on your cardiac medications before your next appointment, please call your pharmacy*   Lab Work: Please have blood work today (BMP, Fructosamine) If you have labs (blood work) drawn today and your tests are completely normal, you will receive your results only by: MyChart Message (if you have MyChart) OR A paper copy in the mail If you have any lab test that is abnormal or we need to change your treatment, we will call you to review the results.   Follow-Up: At [facility], you and your health needs are our priority.  As part of our continuing mission to provide you with exceptional heart care, we have created designated Provider Care Teams.  These Care Teams include your primary Cardiologist (physician) and Advanced Practice Providers (APPs -  Physician Assistants and Nurse Practitioners) who all work together to provide you with the care you need, when you need it.  We recommend signing up for the patient portal called "MyChart".  Sign up information is provided on this After Visit Summary.  MyChart is used to connect with patients for Virtual Visits (Telemedicine).  Patients are able to view lab/test results, encounter notes, upcoming appointments, etc.  Non-urgent messages can be sent to your provider as well.   To learn more about what you can do with MyChart, go to [email or web address].    Your next appointment:   3 months  The format for your next appointment:   In Person  Provider:   Candee Furbish, MD {   Important Information About Sugar

## 2021-10-11 NOTE — Assessment & Plan Note (Signed)
Continuing with tafamidis.  Goal-directed medical therapy.  Limited by heart rate and blood pressure.

## 2021-10-11 NOTE — Progress Notes (Signed)
Cardiology Office Note:    Date:  10/11/2021   ID:  Corey [name], DOB 09/22/1945, MRN [id number]  PCP:  [name], MD   [facility] Providers Cardiologist:  Candee Furbish, MD     Referring MD: [name], MD    History of Present Illness:    Corey [name] is a 76 y.o. male here for the follow-up of congestive heart failure, amyloid heart, chronic kidney disease stage IV.  On 09/27/21 his wife called the office and stated he was noticeably short of breath. He was typically fine if not moving around. His wife reported a 15 lb gain over 2 weeks, but patient weight recorded while on phone was 178.5 lb (last weight in chart 175 lb in March) reflecting 3.5 lb gain over unknown time. Previously he had been advised by Richardson Dopp PA-C on 5/10 it was ok to take extra torsemide 20 mg for increased swelling, shortness of breath. He was going to take an additional dose and weigh himself the next day. He was scheduled for a DOD appointment today.  Previously here for cardiac amyloidosis on tafamidis with prior pericardial effusion status post window, CAD combined systolic and diastolic heart failure with prior PEA arrest hypertension hyperlipidemia diabetes type 2 stroke obstructive sleep apnea cognitive impairment and prostate cancer.  Back in May 2022 was having dyspnea on exertion and possible anginal symptoms.  He scheduled for a left and right heart cath.  During that admission he was found to have an 80% occlusion of his LAD with severely elevated left ventricular end-diastolic pressures.  Symptoms were probably more compatible with pressure volume overload and he was started on diuretics.  His echocardiogram on 09/2020 showed EF of 35 to 40%.  Had a large pericardial effusion at the time.  He underwent a pericardial window and biopsies were taken that were compatible with amyloidosis possible AL amyloid low doses.  However, serum and urine studies were not compatible with AL amyloidosis.  He  then underwent PYP scan that was equivocal.  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  Family history showed 2 brothers with heart failure 1 deceased 1 sister died from heart failure.  He has a son that is 46 years old without any issues.  He lost a significant amount of weight with diuresis.  His torsemide was decreased.  Weight has been steady at 156 pounds at last office visit with heart failure team.  PYP scan was delayed because of radiotracer shortage.  He underwent elective L inguinal hernia repair 06/16/21.  At his last appointment he was doing well with improvement in his breathing.He saw Dr. Audie Box for amyloidosis and started on tafamadis. He had been taking it for 3 weeks and tolerating it well.  His wife noticed occasional bilateral LE edema. His L ankle tends to swell more than his R ankle. She will give him an extra Lasix when she notices the swelling.   During a visit on 09/29/2021: He is accompanied by a family member. He is not feeling good today due to shortness of breath and LE edema in his feet. There has not been much of a change on torsemide. Currently he is taking 20 mg torsemide on MWF.  Yesterday they tried an extra dose but noticed no improvement.  He is also dyspneic while trying to hold a conversation. He needs to stop and catch his breath.  When walking from here to the elevator he felt a little  dizzy. His BP in clinic today is 100/60.  They also note that he has been sleeping more lately.  His Jardiance was recently discontinued.  Hew denies any palpitations, or chest pain. No headaches, syncope, orthopnea, or PND.   Today, he is accompanied with his wife. He says his breathing has been fair.   His blood pressure has improved since the last visit, and he has lost 10 lbs of fluid weight. He states that this morning his systolic pressure was 702.   His wife reports that he has been coughing recently from congestion, but his lungs are clear  as of the visit.  He has a lump on his right upper extremity, and his LE edema has improved since last visit.   His wife reports that he continues to take the 20 mg torsemide once a day.   The patient denies chest pain, nocturnal dyspnea, orthopnea.  There have been no palpitations, lightheadedness or syncope.   Past Medical History:  Diagnosis Date   Cataract    CHF (congestive heart failure) ([place])    Chronic heart failure with preserved ejection fraction (HFpEF) ([place])    Colon polyp 2003   Dr [name]; F/U was to be 2008( not completed)   CVA (cerebral infarction) 2014   Diabetes mellitus    Diverticulosis 2003   Dyspnea    with exertion   Gout    Hypertension    Myocardial infarction ([place]) 2014   ?? PEA cardiac arrest   OSA (obstructive sleep apnea) 03/27/2018   Pneumonia 09/29/2011   Avelox X 10 days as OP   Prostate cancer ([place])    Renal insufficiency    Seizures ([place])    not treated for seizure disorder; had a seizure after stroke 2014; no seizure since then    Past Surgical History:  Procedure Laterality Date   COLONOSCOPY W/ POLYPECTOMY  2003   no F/U ([place] discussed 12/03/12)   INGUINAL HERNIA REPAIR Left 06/16/2021   Procedure: LAPAROSCOPIC, POSSIBLY OPEN LEFT INGUINAL HERNIA REPAIR WITH MESH;  Surgeon: Felicie Morn, MD;  Location: [facility];  Service: General;  Laterality: Left;   INSERTION OF MESH N/A 07/16/2017   Procedure: INSERTION OF MESH;  Surgeon: [name], MD;  Location: [place];  Service: General;  Laterality: N/A;   PEG PLACEMENT  2014   PEG TUBE REMOVAL  2014   PROSTATE BIOPSY     RIGHT/LEFT HEART CATH AND CORONARY ANGIOGRAPHY N/A 09/20/2020   Procedure: RIGHT/LEFT HEART CATH AND CORONARY ANGIOGRAPHY;  Surgeon: Leonie Man, MD;  Location: [place] [facility];  Service: Cardiovascular;  Laterality: N/A;   TEE WITHOUT CARDIOVERSION N/A 09/27/2020   Procedure: TRANSESOPHAGEAL ECHOCARDIOGRAM (TEE);  Surgeon: Wonda Olds, MD;  [facility];  Service: Thoracic;  Laterality: N/A;   TRACHEOSTOMY  2014   was closed after hospital stay   [place] N/A 07/16/2017   Procedure: [place];  Surgeon: [name], MD;  Location: [facility];  Service: General;  Laterality: N/A;   wrist aspiration  02/16/12    monosodium urate crystals; Dr Caralyn Guile    Current Medications: Current Meds  Medication Sig   ACCU-CHEK AVIVA PLUS test strip TEST BLOOD SUGAR TWICE DAILY AS DIRECTED   ACCU-CHEK AVIVA PLUS test strip Use as instructed   albuterol (VENTOLIN HFA) 108 (90 Base) MCG/ACT inhaler Inhale 2 puffs into the lungs every 4 (four) hours as needed for wheezing or shortness of  breath.   allopurinol (ZYLOPRIM) 100 MG tablet Take 1/2 (one-half) tablet by mouth once daily   aspirin EC 81 MG tablet Take 1 tablet (81 mg total) by mouth daily.   bismuth subsalicylate (PEPTO BISMOL) 262 MG/15ML suspension Take 30 mLs by mouth every 6 (six) hours as needed for indigestion.   colchicine (COLCRYS) 0.6 MG tablet Take 2 tabs once and then one tab one hour later as needed for gout   glipiZIDE (GLUCOTROL XL) 2.5 MG 24 hr tablet Take 1 tablet (2.5 mg total) by mouth daily with breakfast.   hydrALAZINE (APRESOLINE) 50 MG tablet Take 2 tablets (100 mg total) by mouth 3 (three) times daily.   nitroGLYCERIN (NITROSTAT) 0.4 MG SL tablet Place 0.4 mg under the tongue every 5 (five) minutes as needed for chest pain.   rosuvastatin (CRESTOR) 40 MG tablet Take 1 tablet (40 mg total) by mouth daily.   sitaGLIPtin (JANUVIA) 25 MG tablet Take 1 tablet (25 mg total) by mouth daily.   Tafamidis (VYNDAMAX) 61 MG CAPS Take 61 mg by mouth daily.   torsemide (DEMADEX) 20 MG tablet Take 1 tablet (20 mg total) by mouth daily.   TRUEplus Lancets 33G MISC USE 4 (FOUR) TIMES DAILY   [DISCONTINUED] carvedilol (COREG) 3.125 MG tablet Take 1 tablet (3.125 mg total) by mouth 2 (two) times daily.     Allergies:   Hydrochlorothiazide    Social History   Socioeconomic History   Marital status: Married    Spouse name: [name]   Number of children: 2   Years of education: 12   Highest education level: 12th grade  Occupational History   Not on file  Tobacco Use   Smoking status: Former    Packs/day: 0.25    Years: 1.00    Pack years: 0.25    Types: Cigarettes    Quit date: 05/15/1968    Years since quitting: 53.4   Smokeless tobacco: Never   Tobacco comments:    smoked [place], up to 1-2 cigarettes/ day  Vaping Use   Vaping Use: Never used  Substance and Sexual Activity   Alcohol use: No    Alcohol/week: 0.0 standard drinks   Drug use: No   Sexual activity: Yes  Other Topics Concern   Not on file  Social History Narrative   Walks three times a week, 2-3 miles, occasional walks on treadmill       Patient is right-handed. He lives with his wife in a one level home. He drinks 1 diet Mt. Dew a day and rarely has coffee. He walks 3 x a week for exercise.   Social Determinants of Health   Financial Resource Strain: Medium Risk   Difficulty of Paying Living Expenses: Somewhat hard  Food Insecurity: No Food Insecurity   Worried About Charity fundraiser in the Last Year: Never true   Ran Out of Food in the Last Year: Never true  Transportation Needs: No Transportation Needs   Lack of Transportation (Medical): No   Lack of Transportation (Non-Medical): No  Physical Activity: Not on file  Stress: Not on file  Social Connections: Not on file     Family History: The patient's family history includes Cancer in his father; Diabetes in his father and mother; Heart failure in his brother and mother; Hypertension in his father and mother; Prostate cancer in his father. There is no history of Stroke, Heart disease, or Colon cancer.  ROS:   Please see the history of present  illness.   (+) Minor LE Edema All other systems reviewed and are negative.  EKGs/Labs/Other Studies Reviewed:    Myocardial Stress  Test 12/31/20 Findings are equivocal for TTR amyloidosis. Visual score is 1 and H/CLL ratio is 1.4.    Echo 09/30/20 1. Trace pericardial fluid posterior and around right AV groove. Compared  to prior study 09/21/20, pericardial effusion has essentially resolved.   2. Left ventricular ejection fraction, by estimation, is 40%. The left  ventricle has moderately decreased function. The left ventricle  demonstrates global hypokinesis. There is severe left ventricular  hypertrophy.   3. Right ventricular systolic function is moderately reduced. The right  ventricular size is normal. Severely increased right ventricular wall  thickness.   4. Trivial mitral valve regurgitation.   5. The inferior vena cava is dilated in size with <50% respiratory  variability, suggesting right atrial pressure of 15 mmHg.   Echo 09/21/20 1. There is no left ventricular thrombus with Definity contrast. Left  ventricular ejection fraction, by estimation, is 35 to 40%. The left  ventricle has moderately decreased function. The left ventricle  demonstrates regional wall motion abnormalities  (see scoring diagram/findings for description). There is global left  ventricular hypokinesis, disproportionately severe in the mid-apical  anterior and anterolateral walls and apex. The anterior septum has  relatively preserved contractility. There is  severe concentric left ventricular hypertrophy. Left ventricular diastolic  parameters are consistent with Grade II diastolic dysfunction  (pseudonormalization). Elevated left atrial pressure.   2. Right ventricular systolic function is moderately reduced. The right  ventricular size is normal. Mildly increased right ventricular wall  thickness. There is moderately elevated pulmonary artery systolic  pressure.   3. Left atrial size was severely dilated.   4. Right atrial size was mildly dilated.   5. Large pericardial effusion. The pericardial effusion is  circumferential.  There is no evidence of cardiac tamponade.   6. The mitral valve is normal in structure. Mild mitral valve  regurgitation.   7. The aortic valve is tricuspid. Aortic valve regurgitation is not  visualized. No aortic stenosis is present.   8. The inferior vena cava is dilated in size with <50% respiratory  variability, suggesting right atrial pressure of 15 mmHg.   Comparison(s): The left ventricular function is significantly worse. The  left ventricular wall motion abnormality is worse. The pericardial  effusion is slightly larger. There are no overt findings to support  tamponade, other than inferior vena cava  dilation. However, the sensitivity of echo for the diagnosis of  pericardial tamponade is diminished in the setting of pulmonary artery  hypertension.   R/L Cath 09/20/20 Hemodynamic findings consistent with moderate pulmonary hypertension. LV end diastolic pressure is severely elevated. There is no aortic valve stenosis. Mid LAD to Dist LAD lesion is 80% stenosed. Ramus lesion is 20% stenosed.   SUMMARY Severe single-vessel disease with heavily calcified mid LAD 80% stenosis Acute Combined Systolic and Diastolic Heart Failure (systolic function appears to be reduced, to conserve contrast, LV gram not performed), but LVEDP 32 mmHg and PCWP 28 mmHg. Systemic Hypertension with Mean Arterial Pressure 123 mmHg Secondary Pulmonary Hypertension: PAP 67/19 mmHg-mean 39 mmHg with RVP-EDP of 67/2 mmHg-15 mmHg, RVP and RAP of 14 mmHg. Moderate to severely reduced Cardiac Output-Index: 4.04-2.09   RECOMMENDATIONS With significant CHF, orthopnea and PND with LVEDP of 32 mmHg, patient is not stable for LAD PCI which would likely require atherectomy. We will admit to cardiology service for titration of cardiac meds  and diuresis: Converting losartan to [facility], otherwise continue home medications. IV Lasix 40 mg twice daily Check 2D echo Reassess volume status in the next 1 to 2 days to  determine feasibility for staged LAD PCI (via femoral access due to extreme tortuosity of the innominate artery.)   Slight scale insulin; consider adding [place] or Jardiance    Recent Labs: 02/07/2021: ALT 51 06/10/2021: Hemoglobin 13.4; Platelets 127 09/29/2021: BUN 66; Creatinine, Ser 3.41; Potassium 4.0; Sodium 143   Recent Lipid Panel    Component Value Date/Time   CHOL 88 11/23/2020 1056   TRIG 64.0 11/23/2020 1056   HDL 41.90 11/23/2020 1056   CHOLHDL 2 11/23/2020 1056   VLDL 12.8 11/23/2020 1056   LDLCALC 34 11/23/2020 1056   LDLCALC 61 01/28/2020 1000     Risk Assessment/Calculations:          Physical Exam:    VS:  BP 119/66 (BP Location: Left Arm, Patient Position: Sitting, Cuff Size: Normal)   Pulse (!) 40   Ht '5\' 6"'$  (1.676 m)   Wt 167 lb (75.8 kg)   BMI 26.95 kg/m     Wt Readings from Last 3 Encounters:  10/11/21 167 lb (75.8 kg)  09/29/21 176 lb (79.8 kg)  08/12/21 175 lb 12.8 oz (79.7 kg)     GEN:  Well nourished, well developed in no acute distress HEENT: Normal NECK: No JVD; No carotid bruits LYMPHATICS: No lymphadenopathy CARDIAC: RRR, no murmurs, rubs, gallops RESPIRATORY:  Clear to auscultation without rales, wheezing or rhonchi  ABDOMEN: Soft, non-tender, non-distended MUSCULOSKELETAL:  + LE edema bilaterally markedly improved; No deformity  SKIN: Mild redness noted on right pretibial region, no significant erythema.  No change from prior hyperpigmentation noted.  No oozing. Lump on right upper extremity. NEUROLOGIC:  Alert and oriented x 3 PSYCHIATRIC:  Normal affect   ASSESSMENT:    1. Medication management   2. HFrEF (heart failure with reduced ejection fraction) (HCC)   3. Stage 3b chronic kidney disease ([place])   4. Uncontrolled type 2 diabetes mellitus with hyperglycemia ([place])   5. Acute combined systolic and diastolic heart failure ([place])   6. Hereditary cardiac amyloidosis (HCC)   7. Chronic kidney disease (CKD), stage IV (severe)  (HCC)      PLAN:    Acute combined systolic and diastolic heart failure (HCC) Stopping carvedilol 3.125.  Bradycardia noted. Continue with torsemide 20 mg once a day.  Wife is going to confirm dose.  This was increased from Monday Wednesday Friday.  Edema is markedly improved.  He is down approximately 10 to 15 pounds.  Excellent.  Hereditary cardiac amyloidosis (HCC) Continuing with tafamidis.  Goal-directed medical therapy.  Limited by heart rate and blood pressure.  Chronic kidney disease (CKD), stage IV (severe) (HCC) Checking basic metabolic profile today.  Previously creatinine in the 3.5 range.     Medication Adjustments/Labs and Tests Ordered: Current medicines are reviewed at length with the patient today.  Concerns regarding medicines are outlined above.   Orders Placed This Encounter  Procedures   Basic metabolic panel   Fructosamine   No orders of the defined types were placed in this encounter.  Patient Instructions  Medication Instructions:  Please discontinue your Carvedilol Continue Torsemide 20 mg once a day. Continue all other medications as listed.  *If you need a refill on your cardiac medications before your next appointment, please call your pharmacy*   Lab Work: Please have blood work today (BMP, Fructosamine) If you have  labs (blood work) drawn today and your tests are completely normal, you will receive your results only by: [place] (if you have MyChart) OR A paper copy in the mail If you have any lab test that is abnormal or we need to change your treatment, we will call you to review the results.   Follow-Up: At [facility], you and your health needs are our priority.  As part of our continuing mission to provide you with exceptional heart care, we have created designated Provider Care Teams.  These Care Teams include your primary Cardiologist (physician) and Advanced Practice Providers (APPs -  Physician Assistants and Nurse  Practitioners) who all work together to provide you with the care you need, when you need it.  We recommend signing up for the patient portal called "MyChart".  Sign up information is provided on this After Visit Summary.  MyChart is used to connect with patients for Virtual Visits (Telemedicine).  Patients are able to view lab/test results, encounter notes, upcoming appointments, etc.  Non-urgent messages can be sent to your provider as well.   To learn more about what you can do with MyChart, go to [email or web address].    Your next appointment:   3 months  The format for your next appointment:   In Person  Provider:   Candee Furbish, MD {   Important Information About Sugar         F/U in 3 months  I,Tinashe Williams,acting as a scribe for Candee Furbish, MD.,have documented all relevant documentation on the behalf of Candee Furbish, MD,as directed by  Candee Furbish, MD while in the presence of Candee Furbish, MD.   I, Candee Furbish, MD, have reviewed all documentation for this visit. The documentation on 10/11/21 for the exam, diagnosis, procedures, and orders are all accurate and complete.

## 2021-10-11 NOTE — Assessment & Plan Note (Signed)
Stopping carvedilol 3.125.  Bradycardia noted. Continue with torsemide 20 mg once a day.  Wife is going to confirm dose.  This was increased from Monday Wednesday Friday.  Edema is markedly improved.  He is down approximately 10 to 15 pounds.  Excellent.

## 2021-10-12 DIAGNOSIS — E1159 Type 2 diabetes mellitus with other circulatory complications: Secondary | ICD-10-CM

## 2021-10-12 DIAGNOSIS — Z87891 Personal history of nicotine dependence: Secondary | ICD-10-CM | POA: Diagnosis not present

## 2021-10-12 DIAGNOSIS — Z7984 Long term (current) use of oral hypoglycemic drugs: Secondary | ICD-10-CM | POA: Diagnosis not present

## 2021-10-12 DIAGNOSIS — I509 Heart failure, unspecified: Secondary | ICD-10-CM

## 2021-10-12 LAB — BASIC METABOLIC PANEL
BUN/Creatinine Ratio: 20 (ref 10–24)
BUN: 68 mg/dL — ABNORMAL HIGH (ref 8–27)
CO2: 22 mmol/L (ref 20–29)
Calcium: 9.8 mg/dL (ref 8.6–10.2)
Chloride: 106 mmol/L (ref 96–106)
Creatinine, Ser: 3.35 mg/dL — ABNORMAL HIGH (ref 0.76–1.27)
Glucose: 103 mg/dL — ABNORMAL HIGH (ref 70–99)
Potassium: 4.3 mmol/L (ref 3.5–5.2)
Sodium: 142 mmol/L (ref 134–144)
eGFR: 19 mL/min/{1.73_m2} — ABNORMAL LOW (ref >59)

## 2021-10-12 LAB — FRUCTOSAMINE: Fructosamine: 366 umol/L — ABNORMAL HIGH (ref 0–285)

## 2021-10-14 ENCOUNTER — Ambulatory Visit (INDEPENDENT_AMBULATORY_CARE_PROVIDER_SITE_OTHER): Payer: Medicare HMO | Admitting: Endocrinology

## 2021-10-14 ENCOUNTER — Encounter: Payer: Self-pay | Admitting: Endocrinology

## 2021-10-14 VITALS — BP 142/78 | HR 48 | Ht 66.0 in | Wt 168.2 lb

## 2021-10-14 DIAGNOSIS — E119 Type 2 diabetes mellitus without complications: Secondary | ICD-10-CM

## 2021-10-14 NOTE — Progress Notes (Signed)
Patient ID: Corey [name], male   DOB: [birth year], 76 y.o.   MRN: [id number]   Reason for Appointment : Followup of various problems  History of Present Illness          Diagnosis: Type 2 diabetes mellitus, date of diagnosis: 1997      Past history: The patient was initially treated with various oral hypoglycemic drugs and details are not available. About 2009 he was started on insulin and has either taken premixed insulin or Lantus once a day He had difficulty affording his Lantus insulin and had preferred to take premixed insulin Subsequently insulin was tapered off  Recent history:    Oral hypoglycemic drugs:   Januvia 25 mg daily  Current blood sugar patterns and problems:  His A1c is about the same at 7 compared to 7.2  Now fructosamine 366, Previous fructosamine was 338  He is monitoring his blood sugars very infrequently again and mostly before breakfast and once at bedtime  and rarely after meals  On his last visit because of his renal insufficiency he was changed from Amaryl to glipizide ER  Also was on Jardiance 10 mg daily but his cardiologist stopped is likely from his advanced renal failure He is not able to afford Tradjenta and is not taking Januvia instead Exercise: He walks less recently because of fatigue His weight has fluctuated again His weight is related to his fluid intake Appetite is fairly good   Glucose monitoring:  done less than 1 time a day         Glucometer:  Accu-Chek       Blood Glucose readings from monitor review   PRE-MEAL Fasting Lunch Dinner Bedtime Overall  Glucose range: 64-94   69   Mean/median:     93   POST-MEAL PC Breakfast PC Lunch PC Dinner  Glucose range:   ?  Mean/median:      Previously:  PRE-MEAL Fasting Lunch Dinner Bedtime Overall  Glucose range: 107  70-81    Mean/median:    129 104   POST-MEAL PC Breakfast PC Lunch PC Dinner  Glucose range:   140, 220  Mean/median:       Self-care:       Meals:  2-3 meals per day. Egg in am at 9-10 am or grits, rarely sandwich at lunch. His largest meal is at suppertime at about 8 pm.         Dietician visit: Most recent: 02/2014               Compliance with the medical regimen:  Fair.    Wt Readings from Last 3 Encounters:  10/14/21 168 lb 3.2 oz (76.3 kg)  10/11/21 167 lb (75.8 kg)  09/29/21 176 lb (79.8 kg)    Lab Results  Component Value Date   HGBA1C 7.0 (H) 08/09/2021   HGBA1C 7.2 (H) 05/06/2021   HGBA1C 6.8 (H) 02/07/2021    Lab Results  Component Value Date   MICROALBUR 8.5 (H) 11/23/2020   LDLCALC 34 11/23/2020   CREATININE 3.35 (H) 10/11/2021   Lab Results  Component Value Date   FRUCTOSAMINE 366 (H) 10/11/2021   FRUCTOSAMINE 287 (H) 08/09/2021   FRUCTOSAMINE 338 (H) 01/03/2021    Office Visit on 10/11/2021  Component Date Value Ref Range Status   Glucose 10/11/2021 103 (H)  70 - 99 mg/dL Final   BUN 10/11/2021 68 (H)  8 - 27 mg/dL Final   Creatinine, Ser 10/11/2021 3.35 (H)  0.76 - 1.27 mg/dL Final   eGFR 10/11/2021 19 (L)  >59 mL/min/1.73 Final   BUN/Creatinine Ratio 10/11/2021 20  10 - 24 Final   Sodium 10/11/2021 142  134 - 144 mmol/L Final   Potassium 10/11/2021 4.3  3.5 - 5.2 mmol/L Final   Chloride 10/11/2021 106  96 - 106 mmol/L Final   CO2 10/11/2021 22  20 - 29 mmol/L Final   Calcium 10/11/2021 9.8  8.6 - 10.2 mg/dL Final   Fructosamine 10/11/2021 366 (H)  0 - 285 umol/L Final   Comment: Published reference interval for apparently healthy subjects between age 77 and 76 is 19 - 285 umol/L and in a poorly controlled diabetic population is 228 - 563 umol/L with a mean of 396 umol/L.    Other active medical problems addressed: See review of systems   Allergies as of 10/14/2021       Reactions   Hydrochlorothiazide Other (See Comments)   Gout , uncontrolled diabetes and renal insufficiency        Medication List        Accurate as of October 14, 2021 11:01 AM. If you have any questions, ask your  nurse or doctor.          Accu-Chek Aviva Plus test strip Generic drug: glucose blood Use as instructed   Accu-Chek Aviva Plus test strip Generic drug: glucose blood TEST BLOOD SUGAR TWICE DAILY AS DIRECTED   albuterol 108 (90 Base) MCG/ACT inhaler Commonly known as: VENTOLIN HFA Inhale 2 puffs into the lungs every 4 (four) hours as needed for wheezing or shortness of breath.   allopurinol 100 MG tablet Commonly known as: ZYLOPRIM Take 1/2 (one-half) tablet by mouth once daily   aspirin EC 81 MG tablet Take 1 tablet (81 mg total) by mouth daily.   bismuth subsalicylate 409 WJ/19JY suspension Commonly known as: PEPTO BISMOL Take 30 mLs by mouth every 6 (six) hours as needed for indigestion.   colchicine 0.6 MG tablet Commonly known as: Colcrys Take 2 tabs once and then one tab one hour later as needed for gout   glipiZIDE 2.5 MG 24 hr tablet Commonly known as: GLUCOTROL XL Take 1 tablet (2.5 mg total) by mouth daily with breakfast.   hydrALAZINE 50 MG tablet Commonly known as: APRESOLINE Take 2 tablets (100 mg total) by mouth 3 (three) times daily.   nitroGLYCERIN 0.4 MG SL tablet Commonly known as: NITROSTAT Place 0.4 mg under the tongue every 5 (five) minutes as needed for chest pain.   rosuvastatin 40 MG tablet Commonly known as: CRESTOR Take 1 tablet (40 mg total) by mouth daily.   sitaGLIPtin 25 MG tablet Commonly known as: Januvia Take 1 tablet (25 mg total) by mouth daily.   torsemide 20 MG tablet Commonly known as: DEMADEX Take 1 tablet (20 mg total) by mouth daily.   TRUEplus Lancets 33G Misc USE 4 (FOUR) TIMES DAILY   Vyndamax 61 MG Caps Generic drug: Tafamidis Take 61 mg by mouth daily.        Allergies:  Allergies  Allergen Reactions   Hydrochlorothiazide Other (See Comments)    Gout , uncontrolled diabetes and renal insufficiency    Past Medical History:  Diagnosis Date   Cataract    CHF (congestive heart failure) (HCC)     Chronic heart failure with preserved ejection fraction (HFpEF) (HCC)    Colon polyp 2003   Dr [name]; F/U was to be 2008( not completed)   CVA (cerebral infarction)  2014   Diabetes mellitus    Diverticulosis 2003   Dyspnea    with exertion   Gout    Hypertension    Myocardial infarction ([place]) 2014   ?? PEA cardiac arrest   OSA (obstructive sleep apnea) 03/27/2018   Pneumonia 09/29/2011   Avelox X 10 days as OP   Prostate cancer ([place])    Renal insufficiency    Seizures ([place])    not treated for seizure disorder; had a seizure after stroke 2014; no seizure since then    Past Surgical History:  Procedure Laterality Date   COLONOSCOPY W/ POLYPECTOMY  2003   no F/U ([place] discussed 12/03/12)   INGUINAL HERNIA REPAIR Left 06/16/2021   Procedure: LAPAROSCOPIC, POSSIBLY OPEN LEFT INGUINAL HERNIA REPAIR WITH MESH;  Surgeon: Felicie Morn, MD;  Location: [facility];  Service: General;  Laterality: Left;   INSERTION OF MESH N/A 07/16/2017   Procedure: INSERTION OF MESH;  Surgeon: [name], MD;  Location: [facility];  Service: General;  Laterality: N/A;   PEG PLACEMENT  2014   PEG TUBE REMOVAL  2014   PROSTATE BIOPSY     RIGHT/LEFT HEART CATH AND CORONARY ANGIOGRAPHY N/A 09/20/2020   Procedure: RIGHT/LEFT HEART CATH AND CORONARY ANGIOGRAPHY;  Surgeon: Leonie Man, MD;  Location: [facility];  Service: Cardiovascular;  Laterality: N/A;   TEE WITHOUT CARDIOVERSION N/A 09/27/2020   Procedure: TRANSESOPHAGEAL ECHOCARDIOGRAM (TEE);  Surgeon: Wonda Olds, MD;  [facility]: [facility];  Service: Thoracic;  Laterality: N/A;   TRACHEOSTOMY  2014   was closed after hospital stay   [place] N/A 07/16/2017   Procedure: [place];  Surgeon: [name], MD;  Location: [facility] OR;  Service: General;  Laterality: N/A;   wrist aspiration  02/16/12    monosodium urate crystals; Dr Caralyn Guile    Family History  Problem Relation Age of Onset    Heart failure Mother    Hypertension Mother    Diabetes Mother    Hypertension Father    Prostate cancer Father    Diabetes Father    Cancer Father    Heart failure Brother    Stroke Neg Hx    Heart disease Neg Hx    Colon cancer Neg Hx     Social History:  reports that he quit smoking about 53 years ago. His smoking use included cigarettes. He has a 0.25 pack-year smoking history. He has never used smokeless tobacco. He reports that he does not drink alcohol and does not use drugs.    Review of Systems   Lipids: He has been started on Crestor 40 mg daily possibly because of his cardiac history  Lab Results  Component Value Date   CHOL 88 11/23/2020   HDL 41.90 11/23/2020   LDLCALC 34 11/23/2020   TRIG 64.0 11/23/2020   CHOLHDL 2 11/23/2020    Eye exams: Last done in 1/20 without retinopathy     He has renal insufficiency of unclear etiology with variable creatinine levels as follows: He has seen the nephrologist at some point but no records are available again and he is due to go back for follow-up Renal function appears somewhat worse  Lab Results  Component Value Date   CREATININE 3.35 (H) 10/11/2021   CREATININE 3.41 (H) 09/29/2021   CREATININE 3.26 (H) 08/09/2021     HYPERTENSION: on losartan 50 mg, amlodipine , clonidine 0.3 mg and hydralazine as well as metoprolol  Currently managed by PCP and cardiologist  Monitors blood pressure at home periodically  He takes DEMADEX 20 mg   BP Readings from Last 3 Encounters:  10/14/21 (!) 142/78  10/11/21 119/66  09/29/21 100/60    Foot exam done in 9/22    he has significant foot deformities and podiatrist has requested diabetic shoes    Physical Examination:  BP (!) 142/78   Pulse (!) 48   Ht 5' 6" (1.676 m)   Wt 168 lb 3.2 oz (76.3 kg)   SpO2 98%   BMI 27.15 kg/m       ASSESSMENT/PLAN:  Diabetes type 2, uncontrolled   See history of present illness for discussion of current diabetes  management, blood sugar patterns and problems identified  A1c is 7 although fructosamine is relatively high  He is on Januvia and glipizide only  Unclear whether he has high postprandial readings which she does not monitor She is generally following his diet but not able to exercise much  Considering his comorbid conditions, age and duration of diabetes his level of control appears to be adequate However likely does not need glipizide since he has some low normal readings  He will continue Januvia but stop glipizide  Reminded him to get his eye exams especially with history of diabetic nephropathy  Patient Instructions  Check blood sugars on waking up 1 days a week  Also check blood sugars about 2 hours after meals and do this after different meals by rotation  Recommended blood sugar levels on waking up are 90-130 and about 2 hours after meal is 130-180  Please bring your blood sugar monitor to each visit, thank you    Elayne Snare 10/14/2021, 11:01 AM

## 2021-10-14 NOTE — Patient Instructions (Addendum)
Check blood sugars on waking up 1 days a week  Also check blood sugars about 2 hours after meals and do this after different meals by rotation  Recommended blood sugar levels on waking up are 90-130 and about 2 hours after meal is 130-180  Please bring your blood sugar monitor to each visit, thank you  Get eye exam

## 2021-10-23 ENCOUNTER — Encounter: Payer: Self-pay | Admitting: Internal Medicine

## 2021-10-23 NOTE — Patient Instructions (Addendum)
       Medications changes include :   doxycycline twice a day for 10 days   Your prescription(s) have been sent to your pharmacy.    A referral was ordered for home physical therapy and the wound center.     Someone from that office will call you to schedule an appointment.    Return in about 1 year (around 10/25/2022) for follow up.

## 2021-10-23 NOTE — Progress Notes (Unsigned)
Subjective:    Patient ID: Corey [name], male    DOB: 1945/09/24, 76 y.o.   MRN: [id number]     HPI Corey [name] is here for follow up of his chronic medical problems, including htn, DM, ckd, gout, HFrEF  He is taking the allopurinol daily-half of the pill.  He denies any gout flares.  He has colchicine on hand to take if needed.  He is following with endocrine, cardiology  Rash on right leg - been there for awhile and not going away.  It itches and hurts.  Tried neosporin.  He thinks it is probably been there for about 2 months.  He denies any injury-does not recall hitting himself.  No exercise.  His equilibrium is off.  He does not use any aide to walk.  He has fallen   Medications and allergies reviewed with patient and updated if appropriate.  Current Outpatient Medications on File Prior to Visit  Medication Sig Dispense Refill   ACCU-CHEK AVIVA PLUS test strip TEST BLOOD SUGAR TWICE DAILY AS DIRECTED 200 strip 1   ACCU-CHEK AVIVA PLUS test strip Use as instructed 50 each 12   albuterol (VENTOLIN HFA) 108 (90 Base) MCG/ACT inhaler Inhale 2 puffs into the lungs every 4 (four) hours as needed for wheezing or shortness of breath. 18 g 1   allopurinol (ZYLOPRIM) 100 MG tablet Take 1/2 (one-half) tablet by mouth once daily 45 tablet 3   aspirin EC 81 MG tablet Take 1 tablet (81 mg total) by mouth daily.     bismuth subsalicylate (PEPTO BISMOL) 262 MG/15ML suspension Take 30 mLs by mouth every 6 (six) hours as needed for indigestion.     colchicine (COLCRYS) 0.6 MG tablet Take 2 tabs once and then one tab one hour later as needed for gout 15 tablet 2   glipiZIDE (GLUCOTROL XL) 2.5 MG 24 hr tablet Take 1 tablet (2.5 mg total) by mouth daily with breakfast. 90 tablet 1   hydrALAZINE (APRESOLINE) 50 MG tablet Take 2 tablets (100 mg total) by mouth 3 (three) times daily. 180 tablet 6   rosuvastatin (CRESTOR) 40 MG tablet Take 1 tablet (40 mg total) by mouth daily. 90 tablet 3   sitaGLIPtin  (JANUVIA) 25 MG tablet Take 1 tablet (25 mg total) by mouth daily. 30 tablet 2   Tafamidis (VYNDAMAX) 61 MG CAPS Take 61 mg by mouth daily. 30 capsule 12   torsemide (DEMADEX) 20 MG tablet Take 1 tablet (20 mg total) by mouth daily. 90 tablet 1   TRUEplus Lancets 33G MISC USE 4 (FOUR) TIMES DAILY 400 each 2   nitroGLYCERIN (NITROSTAT) 0.4 MG SL tablet Place 0.4 mg under the tongue every 5 (five) minutes as needed for chest pain.     No current facility-administered medications on file prior to visit.     Review of Systems  Constitutional:  Negative for chills and fever.  Respiratory:  Positive for shortness of breath (chronic). Negative for cough and wheezing.   Cardiovascular:  Positive for leg swelling (stable). Negative for chest pain and palpitations.  Neurological:  Negative for light-headedness and headaches.       Objective:   Vitals:   10/24/21 1054  BP: 126/70  Pulse: (!) 47  Temp: 98.3 F (36.8 C)  SpO2: 97%   BP Readings from Last 3 Encounters:  10/24/21 126/70  10/14/21 (!) 142/78  10/11/21 119/66   Wt Readings from Last 3 Encounters:  10/24/21 157 lb (71.2 kg)  10/14/21 168 lb 3.2 oz (76.3 kg)  10/11/21 167 lb (75.8 kg)   Body mass index is 25.34 kg/m.    Physical Exam Constitutional:      General: He is not in acute distress.    Appearance: Normal appearance. He is not ill-appearing.  HENT:     Head: Normocephalic and atraumatic.  Eyes:     Conjunctiva/sclera: Conjunctivae normal.  Cardiovascular:     Rate and Rhythm: Normal rate and regular rhythm.     Heart sounds: Normal heart sounds. No murmur heard. Pulmonary:     Effort: Pulmonary effort is normal. No respiratory distress.     Breath sounds: Normal breath sounds. No wheezing or rales.  Musculoskeletal:     Right lower leg: No edema.     Left lower leg: No edema.  Skin:    General: Skin is warm and dry.     Findings: Lesion (Scabbed ulcer right mid anterior lower leg with some mild  surrounding erythema, warmth and tenderness.  No fluctuance or discharge) present. No rash.  Neurological:     Mental Status: He is alert. Mental status is at baseline.  Psychiatric:        Mood and Affect: Mood normal.        Lab Results  Component Value Date   WBC 2.6 (L) 06/10/2021   HGB 13.4 06/10/2021   HCT 43.2 06/10/2021   PLT 127 (L) 06/10/2021   GLUCOSE 103 (H) 10/11/2021   CHOL 88 11/23/2020   TRIG 64.0 11/23/2020   HDL 41.90 11/23/2020   LDLCALC 34 11/23/2020   ALT 51 02/07/2021   AST 45 (H) 02/07/2021   NA 142 10/11/2021   K 4.3 10/11/2021   CL 106 10/11/2021   CREATININE 3.35 (H) 10/11/2021   BUN 68 (H) 10/11/2021   CO2 22 10/11/2021   TSH 1.520 09/27/2020   PSA 4.48 (H) 06/16/2015   INR 1.1 09/26/2020   HGBA1C 7.0 (H) 08/09/2021   MICROALBUR 8.5 (H) 11/23/2020     Assessment & Plan:    See Problem List for Assessment and Plan of chronic medical problems.

## 2021-10-24 ENCOUNTER — Ambulatory Visit (INDEPENDENT_AMBULATORY_CARE_PROVIDER_SITE_OTHER): Payer: Medicare HMO | Admitting: Internal Medicine

## 2021-10-24 VITALS — BP 126/70 | HR 47 | Temp 98.3°F | Ht 66.0 in | Wt 157.0 lb

## 2021-10-24 DIAGNOSIS — R2689 Other abnormalities of gait and mobility: Secondary | ICD-10-CM

## 2021-10-24 DIAGNOSIS — R5381 Other malaise: Secondary | ICD-10-CM

## 2021-10-24 DIAGNOSIS — I10 Essential (primary) hypertension: Secondary | ICD-10-CM

## 2021-10-24 DIAGNOSIS — R29898 Other symptoms and signs involving the musculoskeletal system: Secondary | ICD-10-CM | POA: Diagnosis not present

## 2021-10-24 DIAGNOSIS — S81802A Unspecified open wound, left lower leg, initial encounter: Secondary | ICD-10-CM | POA: Diagnosis not present

## 2021-10-24 DIAGNOSIS — E1122 Type 2 diabetes mellitus with diabetic chronic kidney disease: Secondary | ICD-10-CM

## 2021-10-24 DIAGNOSIS — S81801A Unspecified open wound, right lower leg, initial encounter: Secondary | ICD-10-CM

## 2021-10-24 DIAGNOSIS — M1A9XX0 Chronic gout, unspecified, without tophus (tophi): Secondary | ICD-10-CM

## 2021-10-24 MED ORDER — DOXYCYCLINE HYCLATE 100 MG PO TABS: 100.0000 mg | ORAL_TABLET | Freq: Two times a day (BID) | ORAL | 0 refills | Status: AC

## 2021-10-24 MED ORDER — ALLOPURINOL 100 MG PO TABS: ORAL_TABLET | ORAL | 3 refills | Status: DC

## 2021-10-24 NOTE — Assessment & Plan Note (Signed)
Chronic Would benefit from home physical therapy Ordered referral for home PT

## 2021-10-24 NOTE — Assessment & Plan Note (Signed)
Chronic Managed by cardiology Continue current medications

## 2021-10-24 NOTE — Assessment & Plan Note (Signed)
Chronic  Management per Dr Kumar 

## 2021-10-24 NOTE — Assessment & Plan Note (Signed)
Chronic Denies any gout flares Continue allopurinol 50 mg daily Has colchicine at home to take if needed

## 2021-10-24 NOTE — Assessment & Plan Note (Signed)
Subacute He thinks he has had this for approximately 2 months-denies any obvious injury It itches and hurts-not improving Has a large central scab with some mild erythema that is tender and warm to touch Concern for infection-start doxycycline 100 mg twice daily x10 days We will refer him to wound center-May need debridement

## 2021-10-31 ENCOUNTER — Other Ambulatory Visit: Payer: Self-pay | Admitting: Internal Medicine

## 2021-10-31 ENCOUNTER — Telehealth: Payer: Self-pay

## 2021-10-31 ENCOUNTER — Telehealth: Payer: Self-pay | Admitting: Internal Medicine

## 2021-10-31 NOTE — Telephone Encounter (Signed)
Patient and home PT called to confirm medication. Wanted to know if the patient is supposed to be taking glipizide and [place]. Informed that Dr Dwyane Dee STOPPED gilipizide on last visit but is supposed to be taking [place].

## 2021-10-31 NOTE — Telephone Encounter (Signed)
[place] for verbal orders for home health nursing and PT.   Needs to call cardiology RE heart rate - they manage his meds.  Should take doxycycline.

## 2021-10-31 NOTE — Telephone Encounter (Signed)
Corey [name] has received the referral - HR upper 40 to lower 50's - gets short of breath when exercising - has not taken the doxycycline that we prescribed for him - had loose stools over the weekend = but seems to be improving. Physical therapy 2 x a week for 4 weeks 1 x week for 4 weeks  Patient would benefit  with Home health - nursing - for disease and medication management  Patient would benefit with physical therapy - fall prevention - functional mobility

## 2021-11-01 ENCOUNTER — Encounter (HOSPITAL_BASED_OUTPATIENT_CLINIC_OR_DEPARTMENT_OTHER): Payer: Medicare HMO | Attending: Internal Medicine | Admitting: Internal Medicine

## 2021-11-01 ENCOUNTER — Telehealth: Payer: Self-pay | Admitting: Cardiology

## 2021-11-01 DIAGNOSIS — L97918 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 with other specified severity: Secondary | ICD-10-CM | POA: Diagnosis not present

## 2021-11-01 DIAGNOSIS — I5043 Acute on chronic combined systolic (congestive) and diastolic (congestive) heart failure: Secondary | ICD-10-CM | POA: Insufficient documentation

## 2021-11-01 DIAGNOSIS — I25119 Atherosclerotic heart disease of native coronary artery with unspecified angina pectoris: Secondary | ICD-10-CM | POA: Diagnosis not present

## 2021-11-01 DIAGNOSIS — I5042 Chronic combined systolic (congestive) and diastolic (congestive) heart failure: Secondary | ICD-10-CM

## 2021-11-01 DIAGNOSIS — E11622 Type 2 diabetes mellitus with other skin ulcer: Secondary | ICD-10-CM | POA: Diagnosis not present

## 2021-11-01 DIAGNOSIS — I130 Hypertensive heart and chronic kidney disease with heart failure and stage 1 through stage 4 chronic kidney disease, or unspecified chronic kidney disease: Secondary | ICD-10-CM | POA: Insufficient documentation

## 2021-11-01 DIAGNOSIS — N184 Chronic kidney disease, stage 4 (severe): Secondary | ICD-10-CM | POA: Insufficient documentation

## 2021-11-01 DIAGNOSIS — E1122 Type 2 diabetes mellitus with diabetic chronic kidney disease: Secondary | ICD-10-CM | POA: Diagnosis not present

## 2021-11-01 DIAGNOSIS — Z87891 Personal history of nicotine dependence: Secondary | ICD-10-CM | POA: Diagnosis not present

## 2021-11-01 NOTE — Progress Notes (Signed)
DAVI, KROON ([id number]) Visit Report for 11/01/2021 Abuse Risk Screen Details Patient Name: Date of Service: Corey [name] 11/01/2021 9:45 A M Medical Record Number: [id number] Patient Account Number: [id number] Date of Birth/Sex: Treating RN: Feb 20, 1946 (76 y.o. Corey [name] Primary Care Corey [name]: Billey Gosling Other Clinician: Referring Alyvia Derk: Treating Corey [name]/Extender: Mickle Asper in Treatment: 0 Abuse Risk Screen Items Answer ABUSE RISK SCREEN: Has anyone close to you tried to hurt or harm you recentlyo No Do you feel uncomfortable with anyone in your familyo No Has anyone forced you do things that you didnt want to doo No Electronic Signature(s) Signed: 11/01/2021 5:08:20 PM By: Deon Pilling RN, BSN Entered By: Deon Pilling on 11/01/2021 10:21:02 -------------------------------------------------------------------------------- Activities of Daily Living Details Patient Name: Date of Service: Corey [name]. 11/01/2021 9:45 A M Medical Record Number: [id number] Patient Account Number: [id number] Date of Birth/Sex: Treating RN: 09-Jul-1945 (76 y.o. Corey [name] Primary Care Kylie Gros: Billey Gosling Other Clinician: Referring Arron Mcnaught: Treating Padraig Nhan/Extender: [name] in Treatment: 0 Activities of Daily Living Items Answer Activities of Daily Living (Please select one for each item) Drive Automobile Not Able T Medications ake Completely Able Use T elephone Completely Able Care for Appearance Completely Able Use T oilet Completely Able Bath / Shower Completely Able Dress Self Completely Able Feed Self Completely Able Walk Completely Able Get In / Out Bed Completely Able Housework Not Able Prepare Meals Not [place] for Self Completely Able Electronic Signature(s) Signed: 11/01/2021 5:08:20 PM By: Deon Pilling RN, BSN Entered By: Deon Pilling on 11/01/2021  10:21:28 -------------------------------------------------------------------------------- Education Screening Details Patient Name: Date of Service: [name], [name] 11/01/2021 9:45 A M Medical Record Number: [id number] Patient Account Number: [id number] Date of Birth/Sex: Treating RN: 11/13/1945 (76 y.o. Corey [name] Primary Care Calvyn Kurtzman: Billey Gosling Other Clinician: Referring Madisin Hasan: Treating Eniya Cannady/Extender: Mickle Asper in Treatment: 0 Primary Learner Assessed: Patient Learning Preferences/Education Level/Primary Language Learning Preference: Explanation, Demonstration, Printed Material Highest Education Level: High School Preferred Language: English Cognitive Barrier Language Barrier: No Translator Needed: No Memory Deficit: No Emotional Barrier: No Cultural/Religious Beliefs Affecting Medical Care: No Physical Barrier Impaired Vision: Yes Glasses Impaired Hearing: Yes Complete Loss, Hearing Aid, left ear Decreased Hand dexterity: No Knowledge/Comprehension Knowledge Level: High Comprehension Level: High Ability to understand written instructions: High Ability to understand verbal instructions: High Motivation Anxiety Level: Calm Cooperation: Cooperative Education Importance: Acknowledges Need Interest in Health Problems: Asks Questions Perception: Coherent Willingness to Engage in Self-Management High Activities: Readiness to Engage in Self-Management High Activities: Electronic Signature(s) Signed: 11/01/2021 5:08:20 PM By: Deon Pilling RN, BSN Entered By: Deon Pilling on 11/01/2021 10:22:09 -------------------------------------------------------------------------------- Fall Risk Assessment Details Patient Name: Date of Service: [name], [name] 11/01/2021 9:45 A M Medical Record Number: [id number] Patient Account Number: [id number] Date of Birth/Sex: Treating RN: 04-06-1946 (76 y.o. Corey [name] Primary Care  Staci Carver: Billey Gosling Other Clinician: Referring Sumedha Munnerlyn: Treating Alontae Chaloux/Extender: Mickle Asper in Treatment: 0 Fall Risk Assessment Items Have you had 2 or more falls in the last 12 monthso 0 No Have you had any fall that resulted in injury in the last 12 monthso 0 No FALLS RISK SCREEN History of falling - immediate or within 3 months 0 No Secondary diagnosis (Do you have 2 or more medical diagnoseso) 0 No Ambulatory aid None/bed rest/wheelchair/nurse 0 Yes Crutches/cane/walker 0 No Furniture  0 No Intravenous therapy Access/Saline/Heparin Lock 0 No Gait/Transferring Normal/ bed rest/ wheelchair 0 Yes Weak (short steps with or without shuffle, stooped but able to lift head while walking, may seek 0 No support from furniture) Impaired (short steps with shuffle, may have difficulty arising from chair, head down, impaired 0 No balance) Mental Status Oriented to own ability 0 Yes Electronic Signature(s) Signed: 11/01/2021 5:08:20 PM By: Deon Pilling RN, BSN Entered By: Deon Pilling on 11/01/2021 10:22:17 -------------------------------------------------------------------------------- Foot Assessment Details Patient Name: Date of Service: [name], [name] 11/01/2021 9:45 A M Medical Record Number: [id number] Patient Account Number: [id number] Date of Birth/Sex: Treating RN: 1945/10/25 (76 y.o. Corey [name] Primary Care Delita Chiquito: Billey Gosling Other Clinician: Referring Shakyla Nolley: Treating Colt Martelle/Extender: [name] in Treatment: 0 Foot Assessment Items Site Locations + = Sensation present, - = Sensation absent, C = Callus, U = Ulcer R = Redness, W = Warmth, M = Maceration, PU = Pre-ulcerative lesion F = Fissure, S = Swelling, D = Dryness Assessment Right: Left: Other Deformity: No No Prior Foot Ulcer: No No Prior Amputation: No No Charcot Joint: No No Ambulatory Status: Ambulatory Without Help Gait:  Steady Electronic Signature(s) Signed: 11/01/2021 5:08:20 PM By: Deon Pilling RN, BSN Entered By: Deon Pilling on 11/01/2021 10:38:49 -------------------------------------------------------------------------------- Nutrition Risk Screening Details Patient Name: Date of Service: [name], [name] 11/01/2021 9:45 A M Medical Record Number: [id number] Patient Account Number: [id number] Date of Birth/Sex: Treating RN: August 26, 1945 (76 y.o. Corey [name] Primary Care Axzel Rockhill: Billey Gosling Other Clinician: Referring Elody Kleinsasser: Treating Geovana Gebel/Extender: [name] in Treatment: 0 Height (in): 66 Weight (lbs): 145 Body Mass Index (BMI): 23.4 Nutrition Risk Screening Items Score Screening NUTRITION RISK SCREEN: I have an illness or condition that made me change the kind and/or amount of food I eat 2 Yes I eat fewer than two meals per day 0 No I eat few fruits and vegetables, or milk products 0 No I have three or more drinks of beer, liquor or wine almost every day 0 No I have tooth or mouth problems that make it hard for me to eat 0 No I don't always have enough money to buy the food I need 0 No I eat alone most of the time 0 No I take three or more different prescribed or over-the-counter drugs a day 1 Yes Without wanting to, I have lost or gained 10 pounds in the last six months 0 No I am not always physically able to shop, cook and/or feed myself 0 No Nutrition Protocols Good Risk Protocol Provide education on elevated blood Moderate Risk Protocol 0 sugars and impact on wound healing, as applicable High Risk Proctocol Risk Level: Moderate Risk Score: 3 Electronic Signature(s) Signed: 11/01/2021 5:08:20 PM By: Deon Pilling RN, BSN Entered By: Deon Pilling on 11/01/2021 10:62:69

## 2021-11-01 NOTE — Telephone Encounter (Signed)
PT called back. Informed him of patient's last visit and patient's HR of 40 and that Dr. Marlou Porch stopped patient's coreg. PT just wanted to let our office know. Will send message to Dr. Marlou Porch so he is aware and see if he has further advisement.

## 2021-11-01 NOTE — Telephone Encounter (Signed)
STAT if HR is under 50 or over 120 (normal HR is 60-100 beats per minute)  What is your heart rate?  48 at rest  54 at rest  Do you have a log of your heart rate readings (document readings)?   Do you have any other symptoms? Corey [name] states that he reports sob when he walks long, but while doing PT with pt, his oxygen did not drop. He is calling about pt low hr and called pcp and was advised to call Dr. Marlou Porch.

## 2021-11-01 NOTE — Progress Notes (Signed)
MATEO, OVERBECK ([id number]) Visit Report for 11/01/2021 Chief Complaint Document Details Patient Name: Date of Service: Corey [name] 11/01/2021 9:45 A M Medical Record Number: [id number] Patient Account Number: [id number] Date of Birth/Sex: Treating RN: 12-03-1945 (76 y.o. Corey [name] Primary Care Provider: Billey Gosling Other Clinician: Referring Provider: Treating Provider/Extender: [name] in Treatment: 0 Information Obtained from: Patient Chief Complaint 11/01/2021; right lower extremity wound Electronic Signature(s) Signed: 11/01/2021 5:49:34 PM By: Kalman Shan DO Entered By: Kalman Shan on 11/01/2021 17:41:56 -------------------------------------------------------------------------------- Debridement Details Patient Name: Date of Service: Corey [name], [name] 11/01/2021 9:45 A M Medical Record Number: [id number] Patient Account Number: [id number] Date of Birth/Sex: Treating RN: 11-11-1945 (76 y.o. Corey [name] Primary Care Provider: Billey Gosling Other Clinician: Referring Provider: Treating Provider/Extender: [name] in Treatment: 0 Debridement Performed for Assessment: Wound #1 Right,Lateral Lower Leg Performed By: Physician Kalman Shan, DO Debridement Type: Debridement Severity of Tissue Pre Debridement: Fat layer exposed Level of Consciousness (Pre-procedure): Awake and Alert Pre-procedure Verification/Time Out Yes - 10:50 Taken: Start Time: 10:51 Pain Control: Lidocaine 5% topical ointment T Area Debrided (L x W): otal 2 (cm) x 2 (cm) = 4 (cm) Tissue and other material debrided: Viable, Non-Viable, Eschar, Slough, Subcutaneous, Slough Level: Skin/Subcutaneous Tissue Debridement Description: Excisional Instrument: Curette Bleeding: Minimum Hemostasis Achieved: Pressure End Time: 10:57 Procedural Pain: 0 Post Procedural Pain: 0 Response to Treatment: Procedure was tolerated well Level  of Consciousness (Post- Awake and Alert procedure): Post Debridement Measurements of Total Wound Length: (cm) 5.5 Width: (cm) 5 Depth: (cm) 0.1 Volume: (cm) 2.16 Character of Wound/Ulcer Post Debridement: Requires Further Debridement Severity of Tissue Post Debridement: Fat layer exposed Post Procedure Diagnosis Same as Pre-procedure Electronic Signature(s) Signed: 11/01/2021 5:08:20 PM By: Deon Pilling RN, BSN Signed: 11/01/2021 5:49:34 PM By: Kalman Shan DO Entered By: Deon Pilling on 11/01/2021 10:57:43 -------------------------------------------------------------------------------- HPI Details Patient Name: Date of Service: Corey [name], [name] 11/01/2021 9:45 A M Medical Record Number: [id number] Patient Account Number: [id number] Date of Birth/Sex: Treating RN: 1945-08-08 (76 y.o. Corey [name] Primary Care Provider: Billey Gosling Other Clinician: Referring Provider: Treating Provider/Extender: [name] in Treatment: 0 History of Present Illness HPI Description: Admission 11/01/2021 Corey [name] Is a 76 year old male with a past medical history of of type 2 diabetes, coronary artery disease, stage IV kidney disease acute on chronic combined systolic and diastolic heart failure that presents to the clinic for a 68-monthhistory of right lower extremity wound. He states he is not sure how this started. It is completely eschared. He reports his wound has been stable for the past month. He has been keeping the area covered. He has mild chronic pain. He denies signs of infection. He states he has a history of lower extremity ulcers last being on the left leg. He could not tell me how this was treated in the past. Electronic Signature(s) Signed: 11/01/2021 5:49:34 PM By: HKalman ShanDO Entered By: HKalman Shanon 11/01/2021 17:45:30 -------------------------------------------------------------------------------- Physical Exam Details Patient  Name: Date of Service: Corey ORomie [name] [name] 11/01/2021 9:45 A M Medical Record Number: [id number] Account Number: [id number] of Birth/Sex: Treating RN: [birth year](76y.o. MHessie DienerPrimary Care Provider: BBilley GoslingOther Clinician: Referring Provider: Treating Provider/Extender: HLucile CraterWeeks in Treatment: 0 Constitutional respirations regular, non-labored and within target range for patient.. Cardiovascular 2+ dorsalis pedis/posterior tibialis pulses.  Psychiatric pleasant and cooperative. Notes Right lower extremity: T the anterior aspect there is a wound with necrotic tissue throughout. I was able to debride some areas that easily came off and had o slough and granulation tissue underneath. No signs of surrounding infection. Electronic Signature(s) Signed: 11/01/2021 5:49:34 PM By: Kalman Shan DO Signed: 11/01/2021 5:49:34 PM By: Kalman Shan DO Entered By: Kalman Shan on 11/01/2021 17:46:16 -------------------------------------------------------------------------------- Physician Orders Details Patient Name: Date of Service: Corey [name], [name] 11/01/2021 9:45 A M Medical Record Number: [id number] Patient Account Number: [id number] Date of Birth/Sex: Treating RN: [birth year] (76 y.o. Corey [name] Primary Care Provider: Billey Gosling Other Clinician: Referring Provider: Treating Provider/Extender: Mickle Asper in Treatment: 0 Verbal / Phone Orders: No Diagnosis Coding ICD-10 Coding Code Description I50.42 Chronic combined systolic (congestive) and diastolic (congestive) heart failure E11.622 Type 2 diabetes mellitus with other skin ulcer I25.119 Atherosclerotic heart disease of native coronary artery with unspecified angina pectoris L97.918 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 with other specified severity Follow-up Appointments ppointment in 1 week. - Dr. Heber [place] and  [place], Room 8 1115 11/08/2021 Return A Other: - [facility] # [id number] ****Dr. Gwenlyn Found office will call you to schedule the appt time for the arterial tests for both legs.**** Bathing/ Shower/ Hygiene May shower and wash wound with soap and water. - with dressing changes. Edema Control - Lymphedema / SCD / Other Elevate legs to the level of the heart or above for 30 minutes daily and/or when sitting, a frequency of: - 3-4 times a day throughout the day. Avoid standing for long periods of time. Exercise regularly Moisturize legs daily. - both legs and feet every night before bed. Wound Treatment Wound #1 - Lower Leg Wound Laterality: Right, Lateral Cleanser: Soap and Water 1 x Per Day/30 Days Discharge Instructions: May shower and wash wound with dial antibacterial soap and water prior to dressing change. Cleanser: Wound Cleanser 1 x Per Day/30 Days Discharge Instructions: Cleanse the wound with wound cleanser prior to applying a clean dressing using gauze sponges, not tissue or cotton balls. Peri-Wound Care: Skin Prep 1 x Per Day/30 Days Discharge Instructions: Use skin prep as directed Prim Dressing: MediHoney Gel, tube 1.5 (oz) 1 x Per Day/30 Days ary Discharge Instructions: Apply to wound bed as instructed Secondary Dressing: Zetuvit Plus Silicone Border Dressing 7x7(in/in) 1 x Per Day/30 Days Discharge Instructions: Apply silicone border over primary dressing as directed. Services and Therapies rterial Studies- Bilateral - Dr. Gwenlyn Found office to perform the tests- Arterial Duplex studies to bilateral lower legs with ABIs and TBIs. - (ICD10 I25.119 - A Atherosclerotic heart disease of native coronary artery with unspecified angina pectoris) Electronic Signature(s) Signed: 11/01/2021 5:49:34 PM By: Kalman Shan DO Previous Signature: 11/01/2021 5:08:20 PM Version By: Deon Pilling RN, BSN Entered By: Kalman Shan on 11/01/2021 17:46:29 Prescription  11/01/2021 -------------------------------------------------------------------------------- [name]. Kalman Shan DO Patient Name: Provider: [birth year] [id number] Date of Birth: NPI#: Jerilynn Mages [id number] Sex: DEA #: [phone] Phone #: License #: [place] Patient Address: [address] [place], [place] [address] [place], [place] [address] [phone] Allergies hydrochlorothiazide Provider's Orders rterial Studies- Bilateral - ICD10: I25.119 - Dr. Gwenlyn Found office to perform the tests- Arterial Duplex studies to bilateral lower legs with ABIs and TBIs. A Hand Signature: Date(s): Electronic Signature(s) Signed: 11/01/2021 5:49:34 PM By: Kalman Shan DO Previous Signature: 11/01/2021  5:08:20 PM Version By: Deon Pilling RN, BSN Entered By: Kalman Shan on 11/01/2021 17:46:29 -------------------------------------------------------------------------------- Problem List Details Patient Name: Date of Service: Corey O NCE, [name] 11/01/2021 9:45 A M Medical Record Number: [id number] Patient Account Number: [id number] Date of Birth/Sex: Treating RN: 11-15-1945 (76 y.o. Corey [name] Primary Care Provider: Billey Gosling Other Clinician: Referring Provider: Treating Provider/Extender: Mickle Asper in Treatment: 0 Active Problems ICD-10 Encounter Code Description Active Date MDM Diagnosis I50.42 Chronic combined systolic (congestive) and diastolic (congestive) heart failure 11/01/2021 No Yes E11.622 Type 2 diabetes mellitus with other skin ulcer 11/01/2021 No Yes I25.119 Atherosclerotic heart disease of native coronary artery with unspecified angina 11/01/2021 No Yes pectoris L97.918 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 with other 11/01/2021 No Yes specified severity Inactive Problems Resolved Problems Electronic Signature(s) Signed: 11/01/2021  5:49:34 PM By: Kalman Shan DO Entered By: Kalman Shan on 11/01/2021 17:41:25 -------------------------------------------------------------------------------- Progress Note Details Patient Name: Date of Service: Corey [name], [name] 11/01/2021 9:45 A M Medical Record Number: [id number] Patient Account Number: [id number] Date of Birth/Sex: Treating RN: [birth year] (76 y.o. Corey [name] Primary Care Provider: Billey Gosling Other Clinician: Referring Provider: Treating Provider/Extender: [name] in Treatment: 0 Subjective Chief Complaint Information obtained from Patient 11/01/2021; right lower extremity wound History of Present Illness (HPI) Admission 11/01/2021 [name] Is a 76 year old male with a past medical history of of type 2 diabetes, coronary artery disease, stage IV kidney disease acute on chronic combined systolic and diastolic heart failure that presents to the clinic for a 24-monthhistory of right lower extremity wound. He states he is not sure how this started. It is completely eschared. He reports his wound has been stable for the past month. He has been keeping the area covered. He has mild chronic pain. He denies signs of infection. He states he has a history of lower extremity ulcers last being on the left leg. He could not tell me how this was treated in the past. Patient History Allergies hydrochlorothiazide (Reaction: gout) Family History Cancer - Father, Diabetes - Mother,Father, Heart Disease - Mother,Siblings, Hypertension - Mother,Father. Social History Former smoker - quit 1970, Marital Status - Married, Drug Use - No History, Caffeine Use - Moderate - coffee. Medical History Eyes Patient has history of Cataracts Respiratory Patient has history of Sleep Apnea - has CPAP does not wear Cardiovascular Patient has history of Congestive Heart Failure, Hypertension, Myocardial Infarction - 2014 Endocrine Patient has  history of Type II Diabetes Musculoskeletal Patient has history of Gout Neurologic Denies history of Seizure Disorder Patient is treated with Controlled Diet, Oral Agents. Blood sugar is tested. Hospitalization/Surgery History - 06/2021 inguinal hernia repair. - cardioversion 09/2020. - ventral hernia repair 3/19. 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 Vitals Time Taken: 10:15 AM, Height: 66 in, Source: Stated, Weight: 145 lbs, Source: Stated, BMI: 23.4, Temperature: 97.9 F, Pulse: 72 bpm, Respiratory Rate: 20 breaths/min, Blood Pressure: 128/78 mmHg, Capillary Blood Glucose: 102 mg/dl. Cardiovascular 2+ dorsalis pedis/posterior tibialis pulses. Psychiatric pleasant and cooperative. General Notes: Right lower extremity: T the anterior aspect there is a wound with necrotic tissue throughout. I was able to debride some areas that easily o came off and had slough and granulation tissue underneath. No signs of surrounding  infection. Integumentary (Hair, Skin) Wound #1 status is Open. Original cause of wound was Gradually Appeared. The date acquired was: 08/15/2021. The wound is located on the Right,Lateral Lower Leg. The wound measures 5.5cm length x 5cm width x 0.1cm depth; 21.598cm^2 area and 2.16cm^3 volume. There is no tunneling or undermining noted. There is a medium amount of serosanguineous drainage noted. The wound margin is distinct with the outline attached to the wound base. There is no granulation within the wound bed. There is a large (67-100%) amount of necrotic tissue within the wound bed including Eschar. Assessment Active Problems ICD-10 Chronic combined systolic (congestive) and diastolic (congestive) heart  failure Type 2 diabetes mellitus with other skin ulcer Atherosclerotic heart disease of native coronary artery with unspecified angina pectoris 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 with other specified severity Patient presents with a 27-monthhistory of nonhealing ulcer to his right lower extremity. He has a history of diabetes. There is likely a component of venous insufficiency as well. He does have a history of coronary artery disease and his ABIs were noncompressible in office. I recommended obtaining formal ABIs with TBI's and arterial Doppler studies. He does have pulses and likely has enough blood flow for healing. I was able to debride some eschar. For now I recommended Medihoney. Procedures Wound #1 Pre-procedure diagnosis of Wound #1 is a Diabetic Wound/Ulcer of the Lower Extremity located on the Right,Lateral Lower Leg .Severity of Tissue Pre Debridement is: Fat layer exposed. There was a Excisional Skin/Subcutaneous Tissue Debridement with a total area of 4 sq cm performed by HKalman Shan DO. With the following instrument(s): Curette to remove Viable and Non-Viable tissue/material. Material removed includes Eschar, Subcutaneous Tissue, and Slough after achieving pain control using Lidocaine 5% topical ointment. A time out was conducted at 10:50, prior to the start of the procedure. A Minimum amount of bleeding was controlled with Pressure. The procedure was tolerated well with a pain level of 0 throughout and a pain level of 0 following the procedure. Post Debridement Measurements: 5.5cm length x 5cm width x 0.1cm depth; 2.16cm^3 volume. Character of Wound/Ulcer Post Debridement requires further debridement. Severity of Tissue Post Debridement is: Fat layer exposed. Post procedure Diagnosis Wound #1: Same as Pre-Procedure Plan Follow-up Appointments: Return Appointment in 1 week. - Dr. HHeber [place] Room 8 1115 11/08/2021 Other: - [facility]. [facility] # [phone]****Dr. BGwenlyn Foundoffice will call you to schedule the appt time for the arterial tests for both legs.**** Bathing/ Shower/ Hygiene: May shower and wash wound with soap and water. - with dressing changes. Edema Control - Lymphedema / SCD / Other: Elevate legs to the level of the heart or above for 30 minutes daily and/or when sitting, a frequency of: - 3-4 times a day throughout the day. Avoid standing for long periods of time. Exercise regularly Moisturize legs daily. - both legs and feet every night before bed. Services and Therapies ordered were: Arterial Studies- Bilateral - Dr. BGwenlyn Foundoffice to perform the tests- Arterial Duplex studies to bilateral lower legs with ABIs and TBIs. WOUND #1: - Lower Leg Wound Laterality: Right, Lateral Cleanser: Soap and Water 1 x Per Day/30 Days Discharge Instructions: May shower and wash wound with dial antibacterial soap and water prior to dressing change. Cleanser: Wound Cleanser 1 x Per Day/30 Days Discharge Instructions: Cleanse the wound with wound cleanser prior to applying a clean dressing using gauze sponges, not tissue or cotton balls. Peri-Wound Care: Skin  Prep 1 x Per Day/30 Days Discharge Instructions: Use skin prep as directed Prim Dressing: MediHoney Gel, tube 1.5 (oz) 1 x Per Day/30 Days ary Discharge Instructions: Apply to wound bed as instructed Secondary Dressing: Zetuvit Plus Silicone Border Dressing 7x7(in/in) 1 x Per Day/30 Days Discharge Instructions: Apply silicone border over primary dressing as directed. 1. In office sharp debridement 2. Medihoney 3. LEA with ABIs/TBI's 4. Follow-up in 1 week Electronic Signature(s) Signed: 11/01/2021 5:49:34 PM By: Kalman Shan DO Entered By: Kalman Shan on 11/01/2021 17:48:21 -------------------------------------------------------------------------------- HxROS Details Patient Name: Date of Service: Corey [name], [name] 11/01/2021 9:45 A M Medical  Record Number: [id number] Patient Account Number: [id number] Date of Birth/Sex: Treating RN: Aug 05, 1945 (76 y.o. Corey [name] Primary Care Provider: Billey Gosling Other Clinician: Referring Provider: Treating Provider/Extender: [name] in Treatment: 0 Constitutional Symptoms (General Health) Medical History: Past Medical History Notes: CVA 2014 bilateral hearing loss Eyes Medical History: Positive for: Cataracts Respiratory Medical History: Positive for: Sleep Apnea - has CPAP does not wear Cardiovascular Medical History: Positive for: Congestive Heart Failure; Hypertension; Myocardial Infarction - 2014 Gastrointestinal Medical History: Past Medical History Notes: Diverticulosis Endocrine Medical History: Positive for: Type II Diabetes Time with diabetes: >20 years Treated with: Oral agents, Diet Blood sugar tested every day: Yes Tested : BID Genitourinary Medical History: Past Medical History Notes: Renal insufficiency 20/ Musculoskeletal Medical History: Positive for: Gout Neurologic Medical History: Negative for: Seizure Disorder Oncologic Medical History: Past Medical History Notes: Prostate Ca HBO Extended History Items Eyes: Cataracts Immunizations Pneumococcal Vaccine: Received Pneumococcal Vaccination: No Implantable Devices No devices added Hospitalization / Surgery History Type of Hospitalization/Surgery 06/2021 inguinal hernia repair cardioversion 09/2020 ventral hernia repair 3/19 Family and Social History Cancer: Yes - Father; Diabetes: Yes - Mother,Father; Heart Disease: Yes - Mother,Siblings; Hypertension: Yes - Mother,Father; Former smoker - quit 1970; Marital Status - Married; Drug Use: No History; Caffeine Use: Moderate - coffee; Financial Concerns: No; Food, Clothing or Shelter Needs: No; Support System Lacking: No; Transportation Concerns: No Electronic Signature(s) Signed: 11/01/2021 5:08:20 PM By:  Deon Pilling RN, BSN Signed: 11/01/2021 5:49:34 PM By: Kalman Shan DO Entered By: Deon Pilling on 11/01/2021 10:25:07 -------------------------------------------------------------------------------- SuperBill Details Patient Name: Date of Service: Corey [name], [name] 11/01/2021 Medical Record Number: [id number] Patient Account Number: [id number] Date of Birth/Sex: Treating RN: 1946-04-06 (76 y.o. Corey [name] Primary Care Provider: Billey Gosling Other Clinician: Referring Provider: Treating Provider/Extender: [name] in Treatment: 0 Diagnosis Coding ICD-10 Codes Code Description I50.42 Chronic combined systolic (congestive) and diastolic (congestive) heart failure E11.622 Type 2 diabetes mellitus with other skin ulcer I25.119 Atherosclerotic heart disease of native coronary artery with unspecified angina pectoris L97.918 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 with other specified severity Facility Procedures CPT4 Code: 08657846 9 Description: 9213 - WOUND CARE VISIT-LEV 3 EST PT Modifier: Quantity: 1 CPT4 Code: [id number] 1 Description: 1042 - DEB SUBQ TISSUE 20 SQ CM/< ICD-10 Diagnosis Description L97.918 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 with other spec Modifier: ified severity Quantity: 1 Physician Procedures : CPT4 Code Description Modifier [id number] - WC PHYS LEVEL 4 - NEW PT ICD-10 Diagnosis Description L97.918 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 with other specified severity E11.622 Type 2 diabetes mellitus with other  skin ulcer I50.42 Chronic combined systolic (congestive) and diastolic (congestive) heart failure I25.119 Atherosclerotic heart disease of native coronary artery with  unspecified angina pectoris Quantity: 1 : [id number] 11042 - WC PHYS SUBQ TISS 20 SQ CM ICD-10 Diagnosis Description L97.918 Non-pressure chronic ulcer of unspecified part of  right lower leg with other specified severity Quantity: 1 Electronic Signature(s) Signed: 11/01/2021 5:49:34 PM By: Kalman Shan DO Previous Signature: 11/01/2021 5:08:20 PM Version By: Deon Pilling RN, BSN Entered By: Kalman Shan on 11/01/2021 17:49:06

## 2021-11-01 NOTE — Telephone Encounter (Signed)
Left message for physical therapist to call back. Patient's HR was 40 at last office visit and Dr. Marlou Porch stopped patient's coreg. Will discuss when they call back.

## 2021-11-02 ENCOUNTER — Telehealth: Payer: Self-pay | Admitting: Internal Medicine

## 2021-11-02 ENCOUNTER — Telehealth: Payer: Self-pay | Admitting: Cardiology

## 2021-11-02 DIAGNOSIS — I5020 Unspecified systolic (congestive) heart failure: Secondary | ICD-10-CM

## 2021-11-02 DIAGNOSIS — R0609 Other forms of dyspnea: Secondary | ICD-10-CM

## 2021-11-02 DIAGNOSIS — R5381 Other malaise: Secondary | ICD-10-CM

## 2021-11-02 DIAGNOSIS — N184 Chronic kidney disease, stage 4 (severe): Secondary | ICD-10-CM

## 2021-11-02 NOTE — Telephone Encounter (Signed)
Returned call to [place], Therapist, sports with [employer]. Glenard Haring reports patient had a weight in crease of 5lbs since Monday. Monday his wt was 144lbs, today its 149lbs. Denies SOB, edema or other symptoms. HR 54.   Patient reported he is taking all of his medications as directed, he is weighing a the same time every morning before eating or drinking.  Glenard Haring states she provided education on daily weights, calibrating scale and taking medications as prescribed.   Patient will be receiving a weekly nurse visit along with PT/OT.   Will forward to Dr. Marlou Porch for review.

## 2021-11-02 NOTE — Telephone Encounter (Signed)
Spoke to patient's wife to instruct patient increase torsemide form '20mg'$  to '40mg'$  per day times 3 days. Wife verbalized understanding.   Also notified [facility] RN of medication adjustment.

## 2021-11-02 NOTE — Progress Notes (Signed)
RJ, Corey [name] ([id number]) Visit Report for 11/01/2021 Allergy List Details Patient Name: Date of Service: Corey [name] 11/01/2021 9:45 A M Medical Record Number: [id number] Patient Account Number: [id number] Date of Birth/Sex: Treating RN: 10-13-1945 (76 y.o. [name] Primary Care Shariah Assad: Billey Gosling Other Clinician: Referring Josie Mesa: Treating Freada Twersky/Extender: [name] in Treatment: 0 Allergies Active Allergies hydrochlorothiazide Reaction: gout Allergy Notes Electronic Signature(s) Signed: 11/01/2021 5:08:20 PM By: Deon Pilling RN, BSN Entered By: Deon Pilling on 10/31/2021 17:29:01 -------------------------------------------------------------------------------- Arrival Information Details Patient Name: Date of Service: Corey Corey [name], [name] 11/01/2021 9:45 A M Medical Record Number: [id number] Patient Account Number: [id number] Date of Birth/Sex: Treating RN: 1945/10/26 (76 y.o. [name] Primary Care Ryman Rathgeber: Billey Gosling Other Clinician: Referring Forrestine Lecrone: Treating Kyrianna Barletta/Extender: Mickle Asper in Treatment: 0 Visit Information Patient Arrived: Ambulatory Arrival Time: 10:19 Accompanied By: wife Transfer Assistance: None Patient Identification Verified: Yes Secondary Verification Process Completed: Yes Patient Requires Transmission-Based Precautions: No Patient Has Alerts: Yes Patient Alerts: in clinic R Winthrop Electronic Signature(s) Signed: 11/01/2021 5:08:20 PM By: Deon Pilling RN, BSN Entered By: Deon Pilling on 11/01/2021 10:59:20 -------------------------------------------------------------------------------- Clinic Level of Care Assessment Details Patient Name: Date of Service: Corey [name], [name] 11/01/2021 9:45 A M Medical Record Number: [id number] Patient Account Number: [id number] Date of Birth/Sex: Treating RN: March 12, 1946 (76 y.o. [name] Primary Care Jaclin Finks: Billey Gosling  Other Clinician: Referring Avia Merkley: Treating Sadae Arrazola/Extender: Mickle Asper in Treatment: 0 Clinic Level of Care Assessment Items TOOL 1 Quantity Score X- 1 0 Use when EandM and Procedure is performed on INITIAL visit ASSESSMENTS - Nursing Assessment / Reassessment X- 1 20 General Physical Exam (combine w/ comprehensive assessment (listed just below) when performed on new pt. evals) X- 1 25 Comprehensive Assessment (HX, ROS, Risk Assessments, Wounds Hx, etc.) ASSESSMENTS - Wound and Skin Assessment / Reassessment X- 1 10 Dermatologic / Skin Assessment (not related to wound area) ASSESSMENTS - Ostomy and/or Continence Assessment and Care [] - 0 Incontinence Assessment and Management [] - 0 Ostomy Care Assessment and Management (repouching, etc.) PROCESS - Coordination of Care [] - 0 Simple Patient / Family Education for ongoing care X- 1 20 Complex (extensive) Patient / Family Education for ongoing care X- 1 10 Staff obtains Programmer, systems, Records, T Results / Process Orders est [] - 0 Staff telephones HHA, Nursing Homes / Clarify orders / etc [] - 0 Routine Transfer to another Facility (non-emergent condition) [] - 0 Routine Hospital Admission (non-emergent condition) X- 1 15 New Admissions / Biomedical engineer / Ordering NPWT Apligraf, etc. , [] - 0 Emergency Hospital Admission (emergent condition) PROCESS - Special Needs [] - 0 Pediatric / Minor Patient Management [] - 0 Isolation Patient Management [] - 0 Hearing / Language / Visual special needs [] - 0 Assessment of Community assistance (transportation, D/C planning, etc.) [] - 0 Additional assistance / Altered mentation [] - 0 Support Surface(s) Assessment (bed, cushion, seat, etc.) INTERVENTIONS - Miscellaneous [] - 0 External ear exam [] - 0 Patient Transfer (multiple staff / Civil Service fast streamer / Similar devices) [] - 0 Simple Staple / Suture removal (25 or less) [] - 0 Complex  Staple / Suture removal (26 or more) [] - 0 Hypo/Hyperglycemic Management (do not check if billed separately) X- 1 15 Ankle / Brachial Index (ABI) - do not check if billed separately Has the patient been seen at the hospital within  the last three years: Yes Total Score: 115 Level Of Care: New/Established - Level 3 Electronic Signature(s) Signed: 11/01/2021 5:08:20 PM By: Deaton, Bobbi RN, BSN Entered By: Deaton, Bobbi on 11/01/2021 10:59:09 -------------------------------------------------------------------------------- Encounter Discharge Information Details Patient Name: Date of Service: Corey [name], [name] 11/01/2021 9:45 A M Medical Record Number: [id number] Patient Account Number: [id number] Date of Birth/Sex: Treating RN: 10/21/1945 (76 y.o. M) Deaton, Bobbi Primary Care : Burns, Stacy Other Clinician: Referring : Treating /Extender: Hoffman, Jessica Burns, Stacy Weeks in Treatment: 0 Encounter Discharge Information Items Post Procedure Vitals Discharge Condition: Stable Temperature (F): 97.9 Ambulatory Status: Ambulatory Pulse (bpm): 72 Discharge Destination: Home Respiratory Rate (breaths/min): 20 Transportation: Private Auto Blood Pressure (mmHg): 128/78 Accompanied By: wife Schedule Follow-up Appointment: Yes Clinical Summary of Care: Electronic Signature(s) Signed: 11/01/2021 5:08:20 PM By: Deaton, Bobbi RN, BSN Entered By: Deaton, Bobbi on 11/01/2021 11:24:40 -------------------------------------------------------------------------------- Lower Extremity Assessment Details Patient Name: Date of Service: Corey [name], [name] 11/01/2021 9:45 A M Medical Record Number: [id number] Patient Account Number: [id number] Date of Birth/Sex: Treating RN: 07/09/1945 (76 y.o. M) Deaton, Bobbi Primary Care : Burns, Stacy Other Clinician: Referring : Treating /Extender: Hoffman, Jessica Burns, Stacy Weeks in Treatment: 0 Edema  Assessment Assessed: [Left: No] [Right: Yes] Edema: [Left: N] [Right: o] Calf Left: Right: Point of Measurement: 33 cm From Medial Instep 34 cm Ankle Left: Right: Point of Measurement: 11 cm From Medial Instep 21 cm Knee To Floor Left: Right: From Medial Instep 44 cm Vascular Assessment Pulses: Dorsalis Pedis Palpable: [Right:Yes] Electronic Signature(s) Signed: 11/01/2021 5:08:20 PM By: Deaton, Bobbi RN, BSN Entered By: Deaton, Bobbi on 11/01/2021 10:39:06 -------------------------------------------------------------------------------- Multi Wound Chart Details Patient Name: Date of Service: Corey [name], [name] 11/01/2021 9:45 A M Medical Record Number: [id number] Patient Account Number: [id number] Date of Birth/Sex: Treating RN: 09/10/1945 (75 y.o. M) Deaton, Bobbi Primary Care : Burns, Stacy Other Clinician: Referring : Treating /Extender: Hoffman, Jessica Burns, Stacy Weeks in Treatment: 0 Vital Signs Height(in): 66 Capillary Blood Glucose(mg/dl): 102 Weight(lbs): 145 Pulse(bpm): 72 Body Mass Index(BMI): 23.4 Blood Pressure(mmHg): 128/78 Temperature(°F): 97.9 Respiratory Rate(breaths/min): 20 Photos: [N/A:N/A] Right, Lateral Lower Leg N/A N/A Wound Location: Gradually Appeared N/A N/A Wounding Event: Diabetic Wound/Ulcer of the Lower N/A N/A Primary Etiology: Extremity Cataracts, Sleep Apnea, Congestive N/A N/A Comorbid History: Heart Failure, Hypertension, Myocardial Infarction, Type II Diabetes, Gout 08/15/2021 N/A N/A Date Acquired: 0 N/A N/A Weeks of Treatment: Open N/A N/A Wound Status: No N/A N/A Wound Recurrence: 5.5x5x0.1 N/A N/A Measurements L x W x D (cm) 21.598 N/A N/A A (cm) : rea 2.16 N/A N/A Volume (cm) : 0.00% N/A N/A % Reduction in A rea: 0.00% N/A N/A % Reduction in Volume: Unable to visualize wound bed N/A N/A Classification: Medium N/A N/A Exudate A mount: Serosanguineous N/A N/A Exudate  Type: red, brown N/A N/A Exudate Color: Distinct, outline attached N/A N/A Wound Margin: None Present (0%) N/A N/A Granulation A mount: Large (67-100%) N/A N/A Necrotic A mount: Eschar N/A N/A Necrotic Tissue: Fascia: No N/A N/A Exposed Structures: Fat Layer (Subcutaneous Tissue): No Tendon: No Muscle: No Joint: No Bone: No None N/A N/A Epithelialization: Debridement - Excisional N/A N/A Debridement: Pre-procedure Verification/Time Out 10:50 N/A N/A Taken: Lidocaine 5% topical ointment N/A N/A Pain Control: Necrotic/Eschar, Subcutaneous, N/A N/A Tissue Debrided: Slough Skin/Subcutaneous Tissue N/A N/A Level: 4 N/A N/A Debridement A (sq cm): rea Curette N/A N/A Instrument: Minimum N/A N/A Bleeding: Pressure N/A N/A Hemostasis Achieved: 0   N/A N/A Procedural Pain: 0 N/A N/A Post Procedural Pain: Procedure was tolerated well N/A N/A Debridement Treatment Response: 5.5x5x0.1 N/A N/A Post Debridement Measurements L x W x D (cm) 2.16 N/A N/A Post Debridement Volume: (cm) Debridement N/A N/A Procedures Performed: Treatment Notes Wound #1 (Lower Leg) Wound Laterality: Right, Lateral Cleanser Soap and Water Discharge Instruction: May shower and wash wound with dial antibacterial soap and water prior to dressing change. Wound Cleanser Discharge Instruction: Cleanse the wound with wound cleanser prior to applying a clean dressing using gauze sponges, not tissue or cotton balls. Peri-Wound Care Skin Prep Discharge Instruction: Use skin prep as directed Topical Primary Dressing MediHoney Gel, tube 1.5 (oz) Discharge Instruction: Apply to wound bed as instructed Secondary Dressing Zetuvit Plus Silicone Border Dressing 7x7(in/in) Discharge Instruction: Apply silicone border over primary dressing as directed. Secured With Compression Wrap Compression Stockings Add-Ons Electronic Signature(s) Signed: 11/01/2021 5:49:34 PM By: Hoffman, Jessica DO Signed:  11/02/2021 4:19:27 PM By: Deaton, Bobbi RN, BSN Entered By: Hoffman, Jessica on 11/01/2021 17:41:37 -------------------------------------------------------------------------------- Multi-Disciplinary Care Plan Details Patient Name: Date of Service: Corey [name], [name] 11/01/2021 9:45 A M Medical Record Number: [id number] Patient Account Number: [id number] Date of Birth/Sex: Treating RN: 12/03/1945 (75 y.o. M) Deaton, Bobbi Primary Care : Burns, Stacy Other Clinician: Referring : Treating /Extender: Hoffman, Jessica Burns, Stacy Weeks in Treatment: 0 Active Inactive Necrotic Tissue Nursing Diagnoses: Knowledge deficit related to management of necrotic/devitalized tissue Goals: Necrotic/devitalized tissue will be minimized in the wound bed Date Initiated: 11/01/2021 Target Resolution Date: 11/17/2021 Goal Status: Active Patient/caregiver will verbalize understanding of reason and process for debridement of necrotic tissue Date Initiated: 11/01/2021 Target Resolution Date: 11/16/2021 Goal Status: Active Interventions: Assess patient pain level pre-, during and post procedure and prior to discharge Provide education on necrotic tissue and debridement process Treatment Activities: Apply topical anesthetic as ordered : 11/01/2021 Enzymatic debridement : 11/01/2021 Excisional debridement : 11/01/2021 Notes: Orientation to the Wound Care Program Nursing Diagnoses: Knowledge deficit related to the wound healing center program Goals: Patient/caregiver will verbalize understanding of the Wound Healing Center Program Date Initiated: 11/01/2021 Target Resolution Date: 11/17/2021 Goal Status: Active Interventions: Provide education on orientation to the wound center Notes: Pain, Acute or Chronic Nursing Diagnoses: Pain, acute or chronic: actual or potential Potential alteration in comfort, pain Goals: Patient will verbalize adequate pain control and receive pain control  interventions during procedures as needed Date Initiated: 11/01/2021 Target Resolution Date: 11/16/2021 Goal Status: Active Patient/caregiver will verbalize comfort level met Date Initiated: 11/01/2021 Target Resolution Date: 11/16/2021 Goal Status: Active Interventions: Complete pain assessment as per visit requirements Provide education on pain management Treatment Activities: Administer pain control measures as ordered : 11/01/2021 Notes: Wound/Skin Impairment Nursing Diagnoses: Knowledge deficit related to ulceration/compromised skin integrity Goals: Patient/caregiver will verbalize understanding of skin care regimen Date Initiated: 11/01/2021 Target Resolution Date: 11/17/2021 Goal Status: Active Interventions: Assess patient/caregiver ability to obtain necessary supplies Treatment Activities: Skin care regimen initiated : 11/01/2021 Topical wound management initiated : 11/01/2021 Notes: Electronic Signature(s) Signed: 11/01/2021 5:08:20 PM By: Deaton, Bobbi RN, BSN Entered By: Deaton, Bobbi on 11/01/2021 10:43:47 -------------------------------------------------------------------------------- Pain Assessment Details Patient Name: Date of Service: Corey [name], [name] 11/01/2021 9:45 A M Medical Record Number: [id number] Patient Account Number: [id number] Date of Birth/Sex: Treating RN: 08/30/1945 (75 y.o. M) Deaton, Bobbi Primary Care : Burns, Stacy Other Clinician: Referring : Treating /Extender: Hoffman, Jessica Burns, Stacy Weeks in Treatment: 0 Active Problems Location of Pain Severity and   Description of Pain Patient Has Paino Yes Site Locations Rate the pain. Current Pain Level: 10 Pain Management and Medication Current Pain Management: Medication: No Cold Application: No Rest: No Massage: No Activity: No T.E.N.S.: No Heat Application: No Leg drop or elevation: No Is the Current Pain Management Adequate: Adequate How does your wound  impact your activities of daily livingo Sleep: No Bathing: No Appetite: No Relationship With Others: No Bladder Continence: No Emotions: No Bowel Continence: No Work: No Toileting: No Drive: No Dressing: No Hobbies: No Engineer, maintenance) Signed: 11/01/2021 5:08:20 PM By: Deon Pilling RN, BSN Entered By: Deon Pilling on 11/01/2021 10:22:37 -------------------------------------------------------------------------------- Patient/Caregiver Education Details Patient Name: Date of Service: Corey [name] 6/20/2023andnbsp9:45 A M Medical Record Number: [id number] Patient Account Number: [id number] Date of Birth/Gender: Treating RN: 1946/03/24 (76 y.o. [name] Primary Care Physician: Billey Gosling Other Clinician: Referring Physician: Treating Physician/Extender: Mickle Asper in Treatment: 0 Education Assessment Education Provided To: Patient and Caregiver wife [place] Education Topics Provided [place]: o Handouts: Welcome T [facility] [place] o Methods: Explain/Verbal Responses: Reinforcements needed Electronic Signature(s) Signed: 11/01/2021 5:08:20 PM By: Deon Pilling RN, BSN Entered By: Deon Pilling on 11/01/2021 10:52:57 -------------------------------------------------------------------------------- Wound Assessment Details Patient Name: Date of Service: Corey Corey [name], [name] 11/01/2021 9:45 A M Medical Record Number: [id number] Patient Account Number: [id number] Date of Birth/Sex: Treating RN: 07/20/1945 (76 y.o. [name] Primary Care Gurkaran Rahm: Billey Gosling Other Clinician: Referring Ladavion Savitz: Treating Danielle Mink/Extender: [name] in Treatment: 0 Wound Status Wound Number: 1 Primary Diabetic Wound/Ulcer of the Lower Extremity Etiology: Wound Location: Right, Lateral Lower Leg Wound Open Wounding Event: Gradually Appeared Status: Date Acquired: 08/15/2021 Comorbid  Cataracts, Sleep Apnea, Congestive Heart Failure, Hypertension, Weeks Of Treatment: 0 History: Myocardial Infarction, Type II Diabetes, Gout Clustered Wound: No Photos Wound Measurements Length: (cm) 5.5 Width: (cm) 5 Depth: (cm) 0.1 Area: (cm) 21.598 Volume: (cm) 2.16 % Reduction in Area: 0% % Reduction in Volume: 0% Epithelialization: None Tunneling: No Undermining: No Wound Description Classification: Unable to visualize wound bed Wound Margin: Distinct, outline attached Exudate Amount: Medium Exudate Type: Serosanguineous Exudate Color: red, brown Foul Odor After Cleansing: No Slough/Fibrino No Wound Bed Granulation Amount: None Present (0%) Exposed Structure Necrotic Amount: Large (67-100%) Fascia Exposed: No Necrotic Quality: Eschar Fat Layer (Subcutaneous Tissue) Exposed: No Tendon Exposed: No Muscle Exposed: No Joint Exposed: No Bone Exposed: No Treatment Notes Wound #1 (Lower Leg) Wound Laterality: Right, Lateral Cleanser Soap and Water Discharge Instruction: May shower and wash wound with dial antibacterial soap and water prior to dressing change. Wound Cleanser Discharge Instruction: Cleanse the wound with wound cleanser prior to applying a clean dressing using gauze sponges, not tissue or cotton balls. Peri-Wound Care Skin Prep Discharge Instruction: Use skin prep as directed Topical Primary Dressing MediHoney Gel, tube 1.5 (oz) Discharge Instruction: Apply to wound bed as instructed Secondary Dressing Zetuvit Plus Silicone Border Dressing 7x7(in/in) Discharge Instruction: Apply silicone border over primary dressing as directed. Secured With Compression Wrap Compression Stockings Environmental education officer) Signed: 11/01/2021 5:08:20 PM By: Deon Pilling RN, BSN Signed: 11/02/2021 4:06:51 PM By: Rhae Hammock RN Entered By: Rhae Hammock on 11/01/2021  10:36:49 -------------------------------------------------------------------------------- Vitals Details Patient Name: Date of Service: Corey Corey [name], [name] 11/01/2021 9:45 A M Medical Record Number: [id number] Patient Account Number: [id number] Date of Birth/Sex: Treating RN: [birth year] (76 y.o. [name] Primary Care Constantine Ruddick: Quay Burow,  [place].Kava Other Clinician: Referring Litzy Dicker: Treating Bertrice Leder/Extender: Mickle Asper in Treatment: 0 Vital Signs Time Taken: 10:15 Temperature (F): 97.9 Height (in): 66 Pulse (bpm): 72 Source: Stated Respiratory Rate (breaths/min): 20 Weight (lbs): 145 Blood Pressure (mmHg): 128/78 Source: Stated Capillary Blood Glucose (mg/dl): 102 Body Mass Index (BMI): 23.4 Reference Range: 80 - 120 mg / dl Electronic Signature(s) Signed: 11/01/2021 5:08:20 PM By: Deon Pilling RN, BSN Entered By: Deon Pilling on 11/01/2021 10:20:34

## 2021-11-02 NOTE — Telephone Encounter (Signed)
Don called in requesting orders for tub transfer bench for pt. Requesting it be sent to [facility].  Please call with any questions.   Also states he is going to fax over Plan of Care for pt. Supplied him with office fax number.

## 2021-11-02 NOTE — Telephone Encounter (Signed)
Pt c/o swelling: STAT is pt has developed SOB within 24 hours  If swelling, where is the swelling located? no  How much weight have you gained and in what time span? 144 on Monday , now 149  Have you gained 3 pounds in a day or 5 pounds in a week?   Do you have a log of your daily weights (if so, list)? no  Are you currently taking a fluid pill? no  Are you currently SOB? no  Have you traveled recently? No  Angel with [facility]y [name] reporting a weight increase of 5 lbs in 2 days, pt is asymptomatic.

## 2021-11-02 NOTE — Telephone Encounter (Signed)
Attempted to contact Corey [name] on behalf of the patient to give your ok for verbal orders. I left a detailed message on his voicemail yesterday at end of day. I called again this morning wanting to confirm that he had received my message but there was no answer.

## 2021-11-03 NOTE — Telephone Encounter (Signed)
Need to fax to [facility]

## 2021-11-03 NOTE — Telephone Encounter (Signed)
Contacted Don at [facility] to get their fax number, was able to leave a message for a call back at his earliest convenience.

## 2021-11-03 NOTE — Telephone Encounter (Signed)
DME printed

## 2021-11-03 NOTE — Telephone Encounter (Signed)
DME faxed to [facility].

## 2021-11-07 DIAGNOSIS — Z9181 History of falling: Secondary | ICD-10-CM

## 2021-11-07 DIAGNOSIS — E8581 Light chain (AL) amyloidosis: Secondary | ICD-10-CM | POA: Diagnosis not present

## 2021-11-07 DIAGNOSIS — Z7984 Long term (current) use of oral hypoglycemic drugs: Secondary | ICD-10-CM | POA: Diagnosis not present

## 2021-11-07 DIAGNOSIS — M1A30X0 Chronic gout due to renal impairment, unspecified site, without tophus (tophi): Secondary | ICD-10-CM | POA: Diagnosis not present

## 2021-11-07 DIAGNOSIS — H9190 Unspecified hearing loss, unspecified ear: Secondary | ICD-10-CM | POA: Diagnosis not present

## 2021-11-07 DIAGNOSIS — I10 Essential (primary) hypertension: Secondary | ICD-10-CM

## 2021-11-07 DIAGNOSIS — I69311 Memory deficit following cerebral infarction: Secondary | ICD-10-CM | POA: Diagnosis not present

## 2021-11-07 DIAGNOSIS — N179 Acute kidney failure, unspecified: Secondary | ICD-10-CM | POA: Diagnosis not present

## 2021-11-07 DIAGNOSIS — I130 Hypertensive heart and chronic kidney disease with heart failure and stage 1 through stage 4 chronic kidney disease, or unspecified chronic kidney disease: Secondary | ICD-10-CM | POA: Diagnosis not present

## 2021-11-07 DIAGNOSIS — I5042 Chronic combined systolic (congestive) and diastolic (congestive) heart failure: Secondary | ICD-10-CM | POA: Diagnosis not present

## 2021-11-07 DIAGNOSIS — N2581 Secondary hyperparathyroidism of renal origin: Secondary | ICD-10-CM | POA: Diagnosis not present

## 2021-11-07 DIAGNOSIS — Z7982 Long term (current) use of aspirin: Secondary | ICD-10-CM | POA: Diagnosis not present

## 2021-11-07 DIAGNOSIS — I129 Hypertensive chronic kidney disease with stage 1 through stage 4 chronic kidney disease, or unspecified chronic kidney disease: Secondary | ICD-10-CM | POA: Diagnosis not present

## 2021-11-07 DIAGNOSIS — E1122 Type 2 diabetes mellitus with diabetic chronic kidney disease: Secondary | ICD-10-CM | POA: Diagnosis not present

## 2021-11-07 DIAGNOSIS — N1832 Chronic kidney disease, stage 3b: Secondary | ICD-10-CM | POA: Diagnosis not present

## 2021-11-07 DIAGNOSIS — N189 Chronic kidney disease, unspecified: Secondary | ICD-10-CM | POA: Diagnosis not present

## 2021-11-07 DIAGNOSIS — N281 Cyst of kidney, acquired: Secondary | ICD-10-CM | POA: Diagnosis not present

## 2021-11-07 DIAGNOSIS — R809 Proteinuria, unspecified: Secondary | ICD-10-CM | POA: Diagnosis not present

## 2021-11-07 DIAGNOSIS — I5022 Chronic systolic (congestive) heart failure: Secondary | ICD-10-CM | POA: Diagnosis not present

## 2021-11-08 ENCOUNTER — Encounter (HOSPITAL_BASED_OUTPATIENT_CLINIC_OR_DEPARTMENT_OTHER): Payer: Medicare HMO | Admitting: Internal Medicine

## 2021-11-08 ENCOUNTER — Ambulatory Visit: Payer: Medicare HMO | Admitting: Physician Assistant

## 2021-11-08 DIAGNOSIS — Z87891 Personal history of nicotine dependence: Secondary | ICD-10-CM | POA: Diagnosis not present

## 2021-11-08 DIAGNOSIS — E11622 Type 2 diabetes mellitus with other skin ulcer: Secondary | ICD-10-CM | POA: Diagnosis not present

## 2021-11-08 DIAGNOSIS — L97918 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 with other specified severity: Secondary | ICD-10-CM | POA: Diagnosis not present

## 2021-11-08 DIAGNOSIS — N184 Chronic kidney disease, stage 4 (severe): Secondary | ICD-10-CM | POA: Diagnosis not present

## 2021-11-08 DIAGNOSIS — I25119 Atherosclerotic heart disease of native coronary artery with unspecified angina pectoris: Secondary | ICD-10-CM

## 2021-11-08 DIAGNOSIS — E1122 Type 2 diabetes mellitus with diabetic chronic kidney disease: Secondary | ICD-10-CM | POA: Diagnosis not present

## 2021-11-08 DIAGNOSIS — I5042 Chronic combined systolic (congestive) and diastolic (congestive) heart failure: Secondary | ICD-10-CM | POA: Diagnosis not present

## 2021-11-08 DIAGNOSIS — I5043 Acute on chronic combined systolic (congestive) and diastolic (congestive) heart failure: Secondary | ICD-10-CM | POA: Diagnosis not present

## 2021-11-08 DIAGNOSIS — I130 Hypertensive heart and chronic kidney disease with heart failure and stage 1 through stage 4 chronic kidney disease, or unspecified chronic kidney disease: Secondary | ICD-10-CM | POA: Diagnosis not present

## 2021-11-09 ENCOUNTER — Other Ambulatory Visit (HOSPITAL_COMMUNITY): Payer: Self-pay | Admitting: Internal Medicine

## 2021-11-09 DIAGNOSIS — I739 Peripheral vascular disease, unspecified: Secondary | ICD-10-CM

## 2021-11-09 DIAGNOSIS — M79604 Pain in right leg: Secondary | ICD-10-CM

## 2021-11-11 ENCOUNTER — Telehealth: Payer: Self-pay | Admitting: Internal Medicine

## 2021-11-11 NOTE — Telephone Encounter (Signed)
Caprice Red from [facility] called to report a heart rate. 56 at rest today. No complaints. Current heart rate parameter is below 60, Don would like to know if it needs to be changed.   Please advise  CB: [phone]

## 2021-11-11 NOTE — Telephone Encounter (Signed)
Dominique from [facility] called to inform us that they haven't been able to reach the pt.   Fyi.

## 2021-11-14 ENCOUNTER — Ambulatory Visit (HOSPITAL_COMMUNITY)
Admission: EM | Admit: 2021-11-14 | Discharge: 2021-11-14 | Disposition: A | Discharge status: Home / Self Care | Payer: Medicare HMO | Attending: Emergency Medicine | Admitting: Emergency Medicine

## 2021-11-14 ENCOUNTER — Telehealth: Payer: Medicare HMO

## 2021-11-14 ENCOUNTER — Telehealth: Payer: Self-pay | Admitting: Internal Medicine

## 2021-11-14 ENCOUNTER — Encounter (HOSPITAL_COMMUNITY): Payer: Self-pay | Admitting: Emergency Medicine

## 2021-11-14 DIAGNOSIS — J069 Acute upper respiratory infection, unspecified: Secondary | ICD-10-CM | POA: Diagnosis not present

## 2021-11-14 DIAGNOSIS — L089 Local infection of the skin and subcutaneous tissue, unspecified: Secondary | ICD-10-CM | POA: Diagnosis not present

## 2021-11-14 DIAGNOSIS — S81801A Unspecified open wound, right lower leg, initial encounter: Secondary | ICD-10-CM | POA: Diagnosis not present

## 2021-11-14 DIAGNOSIS — U071 COVID-19: Secondary | ICD-10-CM

## 2021-11-14 MED ORDER — ALBUTEROL SULFATE HFA 108 (90 BASE) MCG/ACT IN AERS: 2.0000 | INHALATION_SPRAY | RESPIRATORY_TRACT | 1 refills | Status: DC | PRN

## 2021-11-14 MED ORDER — DOXYCYCLINE HYCLATE 100 MG PO CAPS: 100.0000 mg | ORAL_CAPSULE | Freq: Two times a day (BID) | ORAL | 0 refills | Status: DC

## 2021-11-14 NOTE — Telephone Encounter (Signed)
Ok for low HR parameter 55

## 2021-11-14 NOTE — ED Provider Notes (Signed)
[place]    CSN: [id number] Arrival date & time: 11/14/21  1523      History   Chief Complaint Chief Complaint  Patient presents with   Wound Check    HPI Corey [name] is a 76 y.o. male.   Patient presents for evaluation of wound to the right leg.  Endorses increased tenderness at nighttime for 3 to 4 days.  dressing change was completed by home health nurse today who noted a odor to the drainage and recommended evaluation.  Weekly dressing changes are completed by home health nurse and patient and spouse are completing daily dressing changes as directed.  PCP office is currently closed therefore they brought patient to the urgent care.  Denies erythema, fever, chills.  Patient concerned with decreased O2 saturations during physical therapy today.  O2 saturation decreased into the 80s with exertion and patient felt short of breath.  Return to to the 90s upon rest and after use of albuterol inhaler.  Patient and spouse endorse that this does not typically occur during physical therapy.  History of dyspnea, CHS, hypertension, MI, sleep apnea, renal insufficiency, diabetes.   Past Medical History:  Diagnosis Date   Cataract    CHF (congestive heart failure) ([place])    Chronic heart failure with preserved ejection fraction (HFpEF) ([place])    Colon polyp 2003   Dr [name]; F/U was to be 2008( not completed)   CVA (cerebral infarction) 2014   Diabetes mellitus    Diverticulosis 2003   Dyspnea    with exertion   Gout    Hypertension    Myocardial infarction ([place]) 2014   ?? PEA cardiac arrest   OSA (obstructive sleep apnea) 03/27/2018   Pneumonia 09/29/2011   Avelox X 10 days as OP   Prostate cancer ([place])    Renal insufficiency    Seizures ([place])    not treated for seizure disorder; had a seizure after stroke 2014; no seizure since then    Patient Active Problem List   Diagnosis Date Noted   Leg wound, right, initial encounter 10/24/2021   Weakness of both  lower extremities 10/24/2021   Chronic kidney disease (CKD), stage IV (severe) ([place]) 09/29/2021   Pure hypercholesterolemia 07/05/2021   Preoperative cardiovascular examination 04/04/2021   HFrEF (heart failure with reduced ejection fraction) ([place]) 04/03/2021   Bilateral hearing loss 01/04/2021   Bilateral impacted cerumen 01/04/2021   Tympanic membrane perforation, left 01/04/2021   Physical deconditioning 10/16/2020   Hereditary cardiac amyloidosis ([place]) 09/30/2020   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   Progressive angina ([place]) - Atypical; DOE 09/20/2020   CAD (coronary artery disease) 09/20/2020   Acute on chronic combined systolic and diastolic CHF (congestive heart failure) ([place]) 09/20/2020   Acute combined systolic and diastolic heart failure ([place]) 09/20/2020   Soft tissue mass 07/30/2020   Contracture of joint of finger of left hand 07/30/2020   DOE (dyspnea on exertion) 03/01/2019   Mild cognitive impairment 12/05/2018   Muscle twitching 07/23/2018   OSA (obstructive sleep apnea) 03/27/2018   Snores 01/21/2018   Poor balance 01/23/2017   Ankle swelling, left 09/06/2016   Bradycardia 01/28/2016   Weak urinary stream 12/31/2015   Ventral hernia without obstruction or gangrene 09/15/2015   Prostate cancer ([place]) 07/25/2015   Erectile dysfunction 05/04/2015   Urinary frequency 05/04/2015   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   Abnormal finding on MRI of brain 02/14/2015   Leg wound, right 01/08/2013  PEA (Pulseless electrical activity) ([place]) 12/03/2012   Diabetes ([place]) 06/30/2011   Gout 03/05/2007   Essential hypertension 03/05/2007    Past Surgical History:  Procedure Laterality Date   COLONOSCOPY W/ POLYPECTOMY  2003   no F/U ([place] discussed 12/03/12)   INGUINAL HERNIA REPAIR Left 06/16/2021   Procedure: LAPAROSCOPIC, POSSIBLY OPEN LEFT INGUINAL HERNIA REPAIR WITH MESH;  Surgeon: Felicie Morn, MD;  Location: [facility];  Service:  General;  Laterality: Left;   INSERTION OF MESH N/A 07/16/2017   Procedure: INSERTION OF MESH;  Surgeon: [name], MD;  Location: [facility];  Service: General;  Laterality: N/A;   PEG PLACEMENT  2014   PEG TUBE REMOVAL  2014   PROSTATE BIOPSY     RIGHT/LEFT HEART CATH AND CORONARY ANGIOGRAPHY N/A 09/20/2020   Procedure: RIGHT/LEFT HEART CATH AND CORONARY ANGIOGRAPHY;  Surgeon: Leonie Man, MD;  Location: [facility];  Service: Cardiovascular;  Laterality: N/A;   TEE WITHOUT CARDIOVERSION N/A 09/27/2020   Procedure: TRANSESOPHAGEAL ECHOCARDIOGRAM (TEE);  Surgeon: Wonda Olds, MD;  Location[facility];  Service: Thoracic;  Laterality: N/A;   TRACHEOSTOMY  2014   was closed after hospital stay   [place] N/A 07/16/2017   Procedure: [place];  Surgeon: [name], MD;  Location: [facility];  Service: General;  Laterality: N/A;   wrist aspiration  02/16/12    monosodium urate crystals; Dr Caralyn Guile       Home Medications    Prior to Admission medications   Medication Sig Start Date End Date Taking? Authorizing Provider  ACCU-CHEK AVIVA PLUS test strip TEST BLOOD SUGAR TWICE DAILY AS DIRECTED 12/05/20   Elayne Snare, MD  ACCU-CHEK AVIVA PLUS test strip Use as instructed 12/04/20   Elayne Snare, MD  albuterol (VENTOLIN HFA) 108 (90 Base) MCG/ACT inhaler Inhale 2 puffs into the lungs every 4 (four) hours as needed for wheezing or shortness of breath. 05/16/20   Janith Lima, MD  allopurinol (ZYLOPRIM) 100 MG tablet TAKE 1/2 TABLET ONE TIME DAILY 11/01/21   [name], MD  aspirin EC 81 MG tablet Take 1 tablet (81 mg total) by mouth daily. 01/23/17   [name], MD  bismuth subsalicylate (PEPTO BISMOL) 262 MG/15ML suspension Take 30 mLs by mouth every 6 (six) hours as needed for indigestion.    [provider]  colchicine (COLCRYS) 0.6 MG tablet Take 2 tabs once and then one tab one hour later as needed for gout 07/30/20    [name], MD  glipiZIDE (GLUCOTROL XL) 2.5 MG 24 hr tablet Take 1 tablet (2.5 mg total) by mouth daily with breakfast. 05/10/21   Elayne Snare, MD  hydrALAZINE (APRESOLINE) 50 MG tablet Take 2 tablets (100 mg total) by mouth 3 (three) times daily. 09/30/20   Kroeger, Lorelee Cover., PA-C  nitroGLYCERIN (NITROSTAT) 0.4 MG SL tablet Place 0.4 mg under the tongue every 5 (five) minutes as needed for chest pain. 09/30/20 10/11/21  [provider]  rosuvastatin (CRESTOR) 40 MG tablet Take 1 tablet (40 mg total) by mouth daily. 10/01/20   Kroeger, Lorelee Cover., PA-C  sitaGLIPtin (JANUVIA) 25 MG tablet Take 1 tablet (25 mg total) by mouth daily. 08/15/21   Elayne Snare, MD  Tafamidis (VYNDAMAX) 61 MG CAPS Take 61 mg by mouth daily. 04/28/21   Geralynn Rile, MD  torsemide (DEMADEX) 20 MG tablet Take 1 tablet (20 mg total) by mouth daily. 09/29/21  Jerline Pain, MD  TRUEplus Lancets 33G MISC USE 4 (FOUR) TIMES DAILY 04/04/21   Elayne Snare, MD    Family History Family History  Problem Relation Age of Onset   Heart failure Mother    Hypertension Mother    Diabetes Mother    Hypertension Father    Prostate cancer Father    Diabetes Father    Cancer Father    Heart failure Brother    Stroke Neg Hx    Heart disease Neg Hx    Colon cancer Neg Hx     Social History Social History   Tobacco Use   Smoking status: Former    Packs/day: 0.25    Years: 1.00    Total pack years: 0.25    Types: Cigarettes    Quit date: 05/15/1968    Years since quitting: 53.5   Smokeless tobacco: Never   Tobacco comments:    smoked [place], up to 1-2 cigarettes/ day  Vaping Use   Vaping Use: Never used  Substance Use Topics   Alcohol use: No    Alcohol/week: 0.0 standard drinks of alcohol   Drug use: No     Allergies   Hydrochlorothiazide   Review of Systems Review of Systems Defer to hpi   Physical Exam Triage Vital Signs ED Triage Vitals  Enc Vitals Group     BP 11/14/21 1610 130/76      Pulse Rate 11/14/21 1610 (!) 56     Resp 11/14/21 1610 18     Temp 11/14/21 1610 98.4 F (36.9 C)     Temp Source 11/14/21 1610 Oral     SpO2 11/14/21 1610 98 %     Weight --      Height --      Head Circumference --      Peak Flow --      Pain Score 11/14/21 1608 7     Pain Loc --      Pain Edu? --      Excl. in [place]? --    No data found.  Updated Vital Signs BP 130/76 (BP Location: Left Arm)   Pulse (!) 56   Temp 98.4 F (36.9 C) (Oral)   Resp 18   SpO2 98%   Visual Acuity Right Eye Distance:   Left Eye Distance:   Bilateral Distance:    Right Eye Near:   Left Eye Near:    Bilateral Near:     Physical Exam Constitutional:      Appearance: Normal appearance.  HENT:     Head: Normocephalic.  Eyes:     Extraocular Movements: Extraocular movements intact.  Cardiovascular:     Rate and Rhythm: Normal rate and regular rhythm.     Pulses: Normal pulses.     Heart sounds: Normal heart sounds.  Pulmonary:     Effort: Pulmonary effort is normal.     Breath sounds: Normal breath sounds.  Skin:    Comments: For wound present to the lateral of the right lower extremity with granulation tissue, eschar and scant yellow drainage noted, covered with a dressing, and gauze  Neurological:     Mental Status: He is alert and oriented to person, place, and time. Mental status is at baseline.  Psychiatric:        Mood and Affect: Mood normal.      UC Treatments / Results  Labs (all labs ordered are listed, but only abnormal results are displayed) Labs Reviewed - No data  to display  EKG   Radiology No results found.  Procedures Procedures (including critical care time)  Medications Ordered in UC Medications - No data to display  Initial Impression / Assessment and Plan / UC Course  I have reviewed the triage vital signs and the nursing notes.  Pertinent labs & imaging results that were available during my care of the patient were reviewed by me and considered  in my medical decision making (see chart for details).  Wound infection  Vital signs are stable patient is in no signs of distress, wound appears to be infected and therefore we will begin bacterial coverage, doxycycline 7-day course prescribed, recommended reevaluation by home health nurse in 1 week at routine visit, recommended continued daily dressing changes as needed, may follow-up with the wound center initiated care as needed, given strict precautions for worsening signs of infection to go to the nearest emergency department for evaluation for intravenous antibiotics  Vital signs are stable, O2 saturation 98% on room air and lungs are clear, low suspicion for pneumonia, bronchitis, pneumothorax, CHF at this time, discussed with patient and spouse, albuterol inhaler prescribed for continued management of dyspnea, discussed with patient that if desaturation continues during physical therapy that he is to notify his partner PCP for further evaluation and management may follow-up with urgent care as needed Final Clinical Impressions(s) / UC Diagnoses   Final diagnoses:  None   Discharge Instructions   None    ED Prescriptions   None    PDMP not reviewed this encounter.   Hans Eden, NP 11/14/21 1655

## 2021-11-14 NOTE — Telephone Encounter (Signed)
I do not see prior documented need for oxygen treatment or hypoxia so recommendation stands.

## 2021-11-14 NOTE — Telephone Encounter (Signed)
Needs visit to assess oxygen levels urgently with anyone.

## 2021-11-14 NOTE — Telephone Encounter (Signed)
Made Don with [facility] aware of new parameters for HR

## 2021-11-14 NOTE — Telephone Encounter (Signed)
Herbert Deaner, PT nurse with [facility], reports pt had shortness of breath and irregular heart-rate this morning after they took a short walk.   States pt's oxygen started at 90% at rest, then dropped to 80%, and dropped again then 75% by the time the walk was over. Pt's oxygen returned to 99% after 2 uses of his inhaler.   [facility] wants to confirm that the pt has a history of irregular heart beat and breathing difficulties. Please call [facility] G[facility] to confirm DX.   Herbert Deaner: [phone] ---- Cl[facility] Gulling also informed us that the pt's inhaler is 2 months expired and would like [place] to refill that RX to   [place] [place] ([place] -  Phone: [phone]  Fax: [phone]

## 2021-11-14 NOTE — Telephone Encounter (Signed)
Called patient and spoke to patient wife. Informed her that there was no appointments available. Provider recommend patient to be seen at the UC/ ER. I informed patient wife. She states she will take the patient

## 2021-11-14 NOTE — Telephone Encounter (Signed)
Called patient and talked to Corey [name] from [place] who was present now at the patient home stated patient oxygen sats after taking the inhaler was 98%. Patient walked during this and was still at 98%. RN states patient does not seem to be in any respiratory distress. Heart rate was at 67. Please advise if an appt is still needed.

## 2021-11-14 NOTE — ED Triage Notes (Signed)
Pt had physical therapy this morning and when ambulating around O2 saturation would drop to 80s. Pt doctor not in office today so PCP office referred pt to UC.   Pt home health nurse that came out today states that pt might need to be put on antibiotics bc wound on RLE now developed an odor.

## 2021-11-14 NOTE — Discharge Instructions (Signed)
Based on your symptoms of increased tenderness and drainage with odor we will begin treatment for infection  Take doxycycline every morning and every evening for the next 7 days  Your nurse can recheck the wound at your weekly visit  However at any point if you begin to have increased pain, increased wet numbness, increased swelling, fever or chills please go to the nearest emergency department for evaluation for IV medications  On exam your lungs are clear and your O2 saturation is 98% without any assistance, the normal is greater than 90 therefore at this time I do not believe there are any issues of the lungs or respiratory system  You may use an inhaler taking 2 puffs every 4-6 hours as needed for shortness of breath  If your oxygen level continues to decrease during physical therapy please notify your primary doctor for further evaluation

## 2021-11-17 ENCOUNTER — Ambulatory Visit (HOSPITAL_COMMUNITY)
Admission: RE | Admit: 2021-11-17 | Discharge: 2021-11-17 | Disposition: A | Discharge status: Home / Self Care | Payer: Medicare HMO | Source: Ambulatory Visit | Attending: Cardiology | Admitting: Cardiology

## 2021-11-17 ENCOUNTER — Encounter (HOSPITAL_BASED_OUTPATIENT_CLINIC_OR_DEPARTMENT_OTHER): Payer: Medicare HMO | Attending: Internal Medicine | Admitting: Internal Medicine

## 2021-11-17 DIAGNOSIS — M79605 Pain in left leg: Secondary | ICD-10-CM | POA: Diagnosis not present

## 2021-11-17 DIAGNOSIS — E1122 Type 2 diabetes mellitus with diabetic chronic kidney disease: Secondary | ICD-10-CM | POA: Diagnosis not present

## 2021-11-17 DIAGNOSIS — N184 Chronic kidney disease, stage 4 (severe): Secondary | ICD-10-CM | POA: Diagnosis not present

## 2021-11-17 DIAGNOSIS — I130 Hypertensive heart and chronic kidney disease with heart failure and stage 1 through stage 4 chronic kidney disease, or unspecified chronic kidney disease: Secondary | ICD-10-CM | POA: Insufficient documentation

## 2021-11-17 DIAGNOSIS — L97918 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 with other specified severity: Secondary | ICD-10-CM | POA: Insufficient documentation

## 2021-11-17 DIAGNOSIS — M79604 Pain in right leg: Secondary | ICD-10-CM | POA: Diagnosis not present

## 2021-11-17 DIAGNOSIS — I5042 Chronic combined systolic (congestive) and diastolic (congestive) heart failure: Secondary | ICD-10-CM | POA: Diagnosis not present

## 2021-11-17 DIAGNOSIS — I739 Peripheral vascular disease, unspecified: Secondary | ICD-10-CM | POA: Diagnosis not present

## 2021-11-17 DIAGNOSIS — E11622 Type 2 diabetes mellitus with other skin ulcer: Secondary | ICD-10-CM | POA: Diagnosis not present

## 2021-11-17 DIAGNOSIS — I25119 Atherosclerotic heart disease of native coronary artery with unspecified angina pectoris: Secondary | ICD-10-CM | POA: Insufficient documentation

## 2021-11-17 NOTE — Progress Notes (Signed)
ANTHONYMICHAEL, MUNDAY ([id number]) Visit Report for 11/17/2021 Chief Complaint Document Details Patient Name: Date of Service: Vivi Martens. 11/17/2021 10:30 A M Medical Record Number: [id number] Patient Account Number: [id number] Date of Birth/Sex: Treating RN: Jul 15, 1945 (76 y.o. [name] Primary Care Provider: Billey Gosling Other Clinician: Referring Provider: Treating Provider/Extender: [name] in Treatment: 2 Information Obtained from: Patient Chief Complaint 11/01/2021; right lower extremity wound Electronic Signature(s) Signed: 11/17/2021 2:35:21 PM By: Kalman Shan DO Entered By: Kalman Shan on 11/17/2021 12:29:49 -------------------------------------------------------------------------------- HPI Details Patient Name: Date of Service: [name], [name] 11/17/2021 10:30 A M Medical Record Number: [id number] Patient Account Number: [id number] Date of Birth/Sex: Treating RN: 27-Sep-1945 (76 y.o. [name] Primary Care Provider: Billey Gosling Other Clinician: Referring Provider: Treating Provider/Extender: [name] in Treatment: 2 History of Present Illness HPI Description: Admission 11/01/2021 Mr. Roddrick Sharron Is a 76 year old male with a past medical history of of type 2 diabetes, coronary artery disease, stage IV kidney disease acute on chronic combined systolic and diastolic heart failure that presents to the clinic for a 85-monthhistory of right lower extremity wound. He states he is not sure how this started. It is completely eschared. He reports his wound has been stable for the past month. He has been keeping the area covered. He has mild chronic pain. He denies signs of infection. He states he has a history of lower extremity ulcers last being on the left leg. He could not tell me how this was treated in the past. 6/27; patient presents for follow-up. He is scheduled for his lower extremity arterial studies on  July 6th. He has been using Medihoney to the wound bed. He has no issues or complaints today. He denies signs of infection. 7/6; patient presents for follow-up. He has home health that reported the wound having odor and increased pain last week. He was sent to urgent care. He was started on doxycycline. He is continuing Medihoney. He has his scheduled ABIs w/TBIs today. Electronic Signature(s) Signed: 11/17/2021 2:35:21 PM By: HKalman ShanDO Entered By: HKalman Shanon 11/17/2021 12:37:55 -------------------------------------------------------------------------------- Physical Exam Details Patient Name: Date of Service: KO ORomie Levee [name] 11/17/2021 10:30 A M Medical Record Number: [id number] Account Number: [id number] of Birth/Sex: Treating RN: [birth year](76y.o. MHessie DienerPrimary Care Provider: BBilley GoslingOther Clinician: Referring Provider: Treating Provider/Extender: HLucile CraterWeeks in Treatment: 2 Constitutional respirations regular, non-labored and within target range for patient.. Cardiovascular 2+ dorsalis pedis/posterior tibialis pulses. Psychiatric pleasant and cooperative. Notes Right lower extremity: T the anterior aspect there is a wound with tightly adhered necrotic tissue and scant granulation tissue. No signs of surrounding o infection. Electronic Signature(s) Signed: 11/17/2021 2:35:21 PM By: HKalman ShanDO Entered By: HKalman Shanon 11/17/2021 12:38:19 -------------------------------------------------------------------------------- Physician Orders Details Patient Name: Date of Service: KO ORomie Levee [name] 11/17/2021 10:30 A M Medical Record Number: [id number] Account Number: [id number] of Birth/Sex: Treating RN: 106-Aug-1947(76y.o. MHessie DienerPrimary Care Provider: BBilley GoslingOther Clinician: Referring Provider: Treating Provider/Extender: HMickle Asperin Treatment:  2 Verbal / Phone Orders: No Diagnosis Coding ICD-10 Coding Code Description I50.42 Chronic combined systolic (congestive) and diastolic (congestive) heart failure E11.622 Type 2 diabetes mellitus with other skin ulcer I25.119 Atherosclerotic heart disease of native coronary artery with unspecified angina pectoris L97.918 Non-pressure chronic ulcer of unspecified part of  right lower leg with other specified severity Follow-up Appointments ppointment in 1 week. - 11/21/2021 0815 Return A Edema Control - Lymphedema / SCD / Other Elevate legs to the level of the heart or above for 30 minutes daily and/or when sitting, a frequency of: - 3-4 times a day throughout the day. Avoid standing for long periods of time. [place] Orders/Instructions: - [facility] Wound Treatment Wound #1 - Lower Leg Wound Laterality: Right, Lateral Cleanser: Normal Saline 1 x Per Day Discharge Instructions: Cleanse the wound with Normal Saline prior to applying a clean dressing using gauze sponges, not tissue or cotton balls. Cleanser: Soap and Water 1 x Per Day Discharge Instructions: May shower and wash wound with dial antibacterial soap and water prior to dressing change. Cleanser: Wound Cleanser 1 x Per Day Discharge Instructions: Cleanse the wound with wound cleanser prior to applying a clean dressing using gauze sponges, not tissue or cotton balls. Topical: Mupirocin Ointment 1 x Per Day Discharge Instructions: Apply Mupirocin (Bactroban) as instructed Secondary Dressing: Zetuvit Plus Silicone Border Dressing 7x7(in/in) 1 x Per Day Discharge Instructions: Apply silicone border over primary dressing as directed. Patient Medications llergies: hydrochlorothiazide A Notifications Medication Indication Start End 11/17/2021 mupirocin DOSE 1 - topical 2 % ointment - Apply daily to the wound bed Electronic Signature(s) Signed: 11/17/2021 3:39:27 PM By: Kalman Shan DO Signed:  11/17/2021 4:18:35 PM By: Deon Pilling RN, BSN Previous Signature: 11/17/2021 2:35:21 PM Version By: Kalman Shan DO Previous Signature: 11/17/2021 11:36:11 AM Version By: Kalman Shan DO Entered By: Deon Pilling on 11/17/2021 15:39:08 -------------------------------------------------------------------------------- Problem List Details Patient Name: Date of Service: [name], [name] 11/17/2021 10:30 A M Medical Record Number: [id number] Patient Account Number: [id number] Date of Birth/Sex: Treating RN: 1946/03/07 (76 y.o. [name] Primary Care Provider: Billey Gosling Other Clinician: Referring Provider: Treating Provider/Extender: Mickle Asper in Treatment: 2 Active Problems ICD-10 Encounter Code Description Active Date MDM Diagnosis L97.918 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 with other 11/01/2021 No Yes specified severity E11.622 Type 2 diabetes mellitus with other skin ulcer 11/01/2021 No Yes I50.42 Chronic combined systolic (congestive) and diastolic (congestive) heart failure 11/01/2021 No Yes I25.119 Atherosclerotic heart disease of native coronary artery with unspecified angina 11/01/2021 No Yes pectoris Inactive Problems Resolved Problems Electronic Signature(s) Signed: 11/17/2021 2:35:21 PM By: Kalman Shan DO Entered By: Kalman Shan on 11/17/2021 12:29:37 -------------------------------------------------------------------------------- Progress Note Details Patient Name: Date of Service: [name], [name] 11/17/2021 10:30 A M Medical Record Number: [id number] Patient Account Number: [id number] Date of Birth/Sex: Treating RN: Feb 28, 1946 (76 y.o. [name] Primary Care Provider: Billey Gosling Other Clinician: Referring Provider: Treating Provider/Extender: Mickle Asper in Treatment: 2 Subjective Chief Complaint Information obtained from Patient 11/01/2021; right lower  extremity wound History of Present Illness (HPI) Admission 11/01/2021 Mr. Devarius Nelles Is a 76 year old male with a past medical history of of type 2 diabetes, coronary artery disease, stage IV kidney disease acute on chronic combined systolic and diastolic heart failure that presents to the clinic for a 97-monthhistory of right lower extremity wound. He states he is not sure how this started. It is completely eschared. He reports his wound has been stable for the past month. He has been keeping the area covered. He has mild chronic pain. He denies signs of infection. He states he has a history of lower extremity ulcers last being on the left  leg. He could not tell me how this was treated in the past. 6/27; patient presents for follow-up. He is scheduled for his lower extremity arterial studies on July 6th. He has been using Medihoney to the wound bed. He has no issues or complaints today. He denies signs of infection. 7/6; patient presents for follow-up. He has home health that reported the wound having odor and increased pain last week. He was sent to urgent care. He was started on doxycycline. He is continuing Medihoney. He has his scheduled ABIs w/TBIs today. Patient History Family History Cancer - Father, Diabetes - Mother,Father, Heart Disease - Mother,Siblings, Hypertension - Mother,Father. Social History Former smoker - quit 1970, Marital Status - Married, Drug Use - No History, Caffeine Use - Moderate - coffee. Medical History Eyes Patient has history of Cataracts Respiratory Patient has history of Sleep Apnea - has CPAP does not wear Cardiovascular Patient has history of Congestive Heart Failure, Hypertension, Myocardial Infarction - 2014 Endocrine Patient has history of Type II Diabetes Musculoskeletal Patient has history of Gout Neurologic Denies history of Seizure Disorder Hospitalization/Surgery History - 06/2021 inguinal hernia repair. - cardioversion 09/2020. - ventral hernia  repair 3/19. 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 Vitals Time Taken: 11:05 AM, Height: 66 in, Weight: 145 lbs, BMI: 23.4, Temperature: 99.2 F, Pulse: 67 bpm, Respiratory Rate: 18 breaths/min, Blood Pressure: 137/80 mmHg, Capillary Blood Glucose: 120 mg/dl. Cardiovascular 2+ dorsalis pedis/posterior tibialis pulses. Psychiatric pleasant and cooperative. General Notes: Right lower extremity: T the anterior aspect there is a wound with tightly adhered necrotic tissue and scant granulation tissue. No signs of o surrounding infection. Integumentary (Hair, Skin) Wound #1 status is Open. Original cause of wound was Gradually Appeared. The date acquired was: 08/15/2021. The wound has been in treatment 2 weeks. The wound is located on the Right,Lateral Lower Leg. The wound measures 7.5cm length x 7.5cm width x 0.1cm depth; 44.179cm^2 area and 4.418cm^3 volume. There is no tunneling or undermining noted. There is a medium amount of serosanguineous drainage noted. The wound margin is distinct with the outline attached to the wound base. There is no granulation within the wound bed. There is a large (67-100%) amount of necrotic tissue within the wound bed including Eschar. 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 Type 2 diabetes mellitus with other skin ulcer Chronic combined systolic (congestive) and diastolic (congestive) heart failure Atherosclerotic heart disease of native coronary artery with unspecified angina pectoris Last week it was noted by home health that his wound had an odor and increased pain. He was sent to urgent care and they  started doxycycline. I am concerned that this is an arterial ulcer and he has scheduled ABIs with TBI's today. For now I recommended stopping Medihoney and using antibiotic ointment. I will send mupirocin to the pharmacy. Follow-up in 1 week. Plan The following medication(s) was prescribed: mupirocin topical 2 % ointment 1 Apply daily to the wound bed starting 11/17/2021 1. Mupirocin 2. Follow-up in 1 week Electronic Signature(s) Signed: 11/17/2021 2:35:21 PM By: Kalman Shan DO Entered By: Kalman Shan on 11/17/2021 12:40:13 -------------------------------------------------------------------------------- HxROS Details Patient Name: Date of Service: [name], [name] 11/17/2021 10:30 A M Medical Record Number: [id number] Patient Account Number: [id number] Date of Birth/Sex: Treating RN: Sep 25, 1945 (  76 y.o. [name] Primary Care Provider: Billey Gosling Other Clinician: Referring Provider: Treating Provider/Extender: [name] in Treatment: 2 Constitutional Symptoms (General Health) Medical History: Past Medical History Notes: CVA 2014 bilateral hearing loss Eyes Medical History: Positive for: Cataracts Respiratory Medical History: Positive for: Sleep Apnea - has CPAP does not wear Cardiovascular Medical History: Positive for: Congestive Heart Failure; Hypertension; Myocardial Infarction - 2014 Gastrointestinal Medical History: Past Medical History Notes: Diverticulosis Endocrine Medical History: Positive for: Type II Diabetes Time with diabetes: >20 years Treated with: Oral agents, Diet Blood sugar tested every day: Yes Tested : BID Genitourinary Medical History: Past Medical History Notes: Renal insufficiency 20/ Musculoskeletal Medical History: Positive for: Gout Neurologic Medical History: Negative for: Seizure Disorder Oncologic Medical History: Past Medical History Notes: Prostate Ca HBO Extended History  Items Eyes: Cataracts Immunizations Pneumococcal Vaccine: Received Pneumococcal Vaccination: No Implantable Devices No devices added Hospitalization / Surgery History Type of Hospitalization/Surgery 06/2021 inguinal hernia repair cardioversion 09/2020 ventral hernia repair 3/19 Family and Social History Cancer: Yes - Father; Diabetes: Yes - Mother,Father; Heart Disease: Yes - Mother,Siblings; Hypertension: Yes - Mother,Father; Former smoker - quit 1970; Marital Status - Married; Drug Use: No History; Caffeine Use: Moderate - coffee; Financial Concerns: No; Food, Clothing or Shelter Needs: No; Support System Lacking: No; Transportation Concerns: No Electronic Signature(s) Signed: 11/17/2021 2:35:21 PM By: Kalman Shan DO Signed: 11/17/2021 4:18:35 PM By: Deon Pilling RN, BSN Entered By: Kalman Shan on 11/17/2021 12:38:01 -------------------------------------------------------------------------------- SuperBill Details Patient Name: Date of Service: [name], [name] 11/17/2021 Medical Record Number: [id number] Patient Account Number: [id number] Date of Birth/Sex: Treating RN: Nov 09, 1945 (76 y.o. [name] Primary Care Provider: Billey Gosling Other Clinician: Referring Provider: Treating Provider/Extender: [name] in Treatment: 2 Diagnosis Coding ICD-10 Codes Code Description I50.42 Chronic combined systolic (congestive) and diastolic (congestive) heart failure E11.622 Type 2 diabetes mellitus with other skin ulcer I25.119 Atherosclerotic heart disease of native coronary artery with unspecified angina pectoris L97.918 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 with other specified severity Facility Procedures CPT4 Code: [id number] Description: 99214 - WOUND CARE VISIT-LEV 4 EST PT Modifier: Quantity: 1 Physician Procedures : CPT4 Code Description Modifier [id number] - WC PHYS LEVEL 3 - EST PT ICD-10 Diagnosis  Description L97.918 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 with other specified severity E11.622 Type 2 diabetes mellitus with other  skin ulcer I25.119 Atherosclerotic heart disease of native coronary artery with unspecified angina pectoris I50.42 Chronic combined systolic (congestive) and diastolic (congestive) heart failure Quantity: 1 Electronic Signature(s) Signed: 11/17/2021 2:35:21 PM By: Kalman Shan DO Entered By: Kalman Shan on 11/17/2021 12:40:31

## 2021-11-17 NOTE — Progress Notes (Signed)
GIRARD, KOONTZ ([id number]) Visit Report for 11/17/2021 Arrival Information Details Patient Name: Date of Service: Corey [name]. 11/17/2021 10:30 A M Medical Record Number: [id number] Patient Account Number: [id number] Date of Birth/Sex: Treating RN: [birth year] (76 y.o. [name] Primary Care Ernesteen Mihalic: Billey Gosling Other Clinician: Referring Afsheen Antony: Treating Audery Wassenaar/Extender: Mickle Asper in Treatment: 2 Visit Information History Since Last Visit Added or deleted any medications: No Patient Arrived: Ambulatory Any new allergies or adverse reactions: No Arrival Time: 11:04 Had a fall or experienced change in No Accompanied By: wife activities of daily living that may affect Transfer Assistance: None risk of falls: Patient Identification Verified: Yes Signs or symptoms of abuse/neglect since last visito No Secondary Verification Process Completed: Yes Hospitalized since last visit: No Patient Requires Transmission-Based Precautions: No Implantable device outside of the clinic excluding No Patient Has Alerts: Yes cellular tissue based products placed in the center Patient Alerts: in clinic [facility] [place] since last visit: Has Dressing in Place as Prescribed: Yes Has Compression in Place as Prescribed: Yes Pain Present Now: No Electronic Signature(s) Signed: 11/17/2021 4:10:18 PM By: Erenest Blank Entered By: Erenest Blank on 11/17/2021 11:05:10 -------------------------------------------------------------------------------- Clinic Level of Care Assessment Details Patient Name: Date of Service: Corey [name]. 11/17/2021 10:30 A M Medical Record Number: [id number] Patient Account Number: [id number] Date of Birth/Sex: Treating RN: 10/05/1945 (76 y.o. [name] Primary Care Corynne Scibilia: Billey Gosling Other Clinician: Referring Zema Lizardo: Treating Avaeh Ewer/Extender: Mickle Asper in Treatment: 2 Clinic Level of Care Assessment  Items TOOL 4 Quantity Score X- 1 0 Use when only an EandM is performed on FOLLOW-UP visit ASSESSMENTS - Nursing Assessment / Reassessment X- 1 10 Reassessment of Co-morbidities (includes updates in patient status) X- 1 5 Reassessment of Adherence to Treatment Plan ASSESSMENTS - Wound and Skin A ssessment / Reassessment X - Simple Wound Assessment / Reassessment - one wound 1 5 _0  - 0 Complex Wound Assessment / Reassessment - multiple wounds X- 1 10 Dermatologic / Skin Assessment (not related to wound area) ASSESSMENTS - Focused Assessment X- 1 5 Circumferential Edema Measurements - multi extremities X- 1 10 Nutritional Assessment / Counseling / Intervention _1  - 0 Lower Extremity Assessment (monofilament, tuning fork, pulses) _2  - 0 Peripheral Arterial Disease Assessment (using hand held doppler) ASSESSMENTS - Ostomy and/or Continence Assessment and Care _3  - 0 Incontinence Assessment and Management _4  - 0 Ostomy Care Assessment and Management (repouching, etc.) PROCESS - Coordination of Care X - Simple Patient / Family Education for ongoing care 1 15 _5  - 0 Complex (extensive) Patient / Family Education for ongoing care X- 1 10 Staff obtains Programmer, systems, Records, T Results / Process Orders est _6  - 0 Staff telephones HHA, Nursing Homes / Clarify orders / etc _7  - 0 Routine Transfer to another Facility (non-emergent condition) _8  - 0 Routine Hospital Admission (non-emergent condition) _9  - 0 New Admissions / Biomedical engineer / Ordering NPWT Apligraf, etc. , _10  - 0 Emergency Hospital Admission (emergent condition) X- 1 10 Simple Discharge Coordination _11  - 0 Complex (extensive) Discharge Coordination PROCESS - Special Needs _12  - 0 Pediatric / Minor Patient Management _13  - 0 Isolation Patient Management _14  - 0 Hearing / Language / Visual special needs _15  - 0 Assessment of Community assistance (transportation, D/C planning, etc.) _16  - 0 Additional  assistance / Altered mentation _17  - 0 Support Surface(s) Assessment (bed, cushion, seat, etc.) INTERVENTIONS - Wound Cleansing / Measurement X -  Simple Wound Cleansing - one wound 1 5 _0  - 0 Complex Wound Cleansing - multiple wounds X- 1 5 Wound Imaging (photographs - any number of wounds) _1  - 0 Wound Tracing (instead of photographs) X- 1 5 Simple Wound Measurement - one wound _2  - 0 Complex Wound Measurement - multiple wounds INTERVENTIONS - Wound Dressings _3  - 0 Small Wound Dressing one or multiple wounds X- 1 15 Medium Wound Dressing one or multiple wounds _4  - 0 Large Wound Dressing one or multiple wounds X- 1 5 Application of Medications - topical <[id number]>_6<\/[id number]>_9  - 0 Application of Medications - injection INTERVENTIONS - Miscellaneous _6  - 0 External ear exam _7  - 0 Specimen Collection (cultures, biopsies, blood, body fluids, etc.) _8  - 0 Specimen(s) / Culture(s) sent or taken to Lab for analysis _9  - 0 Patient Transfer (multiple staff / Civil Service fast streamer / Similar devices) _10  - 0 Simple Staple / Suture removal (25 or less) _11  - 0 Complex Staple / Suture removal (26 or more) _12  - 0 Hypo / Hyperglycemic Management (close monitor of Blood Glucose) _13  - 0 Ankle / Brachial Index (ABI) - do not check if billed separately X- 1 5 Vital Signs Has the patient been seen at the hospital within the last three years: Yes Total Score: 120 Level Of Care: New/Established - Level 4 Electronic Signature(s) Signed: 11/17/2021 4:18:35 PM By: Deon Pilling RN, BSN Entered By: Deon Pilling on 11/17/2021 11:36:20 -------------------------------------------------------------------------------- Encounter Discharge Information Details Patient Name: Date of Service: [name], [name] 11/17/2021 10:30 A M Medical Record Number: [id number] Patient Account Number: [id number] Date of Birth/Sex: Treating RN: 30-Mar-1946 (76 y.o. [name] Primary Care Karinne Schmader: Billey Gosling Other Clinician: Referring  Izaiyah Kleinman: Treating Lenna Hagarty/Extender: Mickle Asper in Treatment: 2 Encounter Discharge Information Items Discharge Condition: Stable Ambulatory Status: Ambulatory Discharge Destination: Home Transportation: Private Auto Accompanied By: self Schedule Follow-up Appointment: Yes Clinical Summary of Care: Electronic Signature(s) Signed: 11/17/2021 4:18:35 PM By: Deon Pilling RN, BSN Entered By: Deon Pilling on 11/17/2021 11:37:09 -------------------------------------------------------------------------------- Lower Extremity Assessment Details Patient Name: Date of Service: [name], [name] 11/17/2021 10:30 A M Medical Record Number: [id number] Patient Account Number: [id number] Date of Birth/Sex: Treating RN: 1945-12-27 (76 y.o. [name] Primary Care Amadou Katzenstein: Billey Gosling Other Clinician: Referring Chandria Rookstool: Treating Ernesta Trabert/Extender: [name] in Treatment: 2 Edema Assessment Assessed: [Left: No] [Right: No] Edema: [Left: N] [Right: o] Calf Left: Right: Point of Measurement: 33 cm From Medial Instep 36.5 cm Ankle Left: Right: Point of Measurement: 11 cm From Medial Instep 22.6 cm Electronic Signature(s) Signed: 11/17/2021 4:10:18 PM By: Erenest Blank Signed: 11/17/2021 4:18:35 PM By: Deon Pilling RN, BSN Entered By: Erenest Blank on 11/17/2021 11:15:10 -------------------------------------------------------------------------------- Multi Wound Chart Details Patient Name: Date of Service: [name], [name] 11/17/2021 10:30 A M Medical Record Number: [id number] Patient Account Number: [id number] Date of Birth/Sex: Treating RN: 09/19/1945 (76 y.o. [name] Primary Care Sonja Manseau: Billey Gosling Other Clinician: Referring Emillia Weatherly: Treating Isabelle Matt/Extender: [name] in Treatment: 2 Vital Signs Height(in): 66 Capillary Blood Glucose(mg/dl): 120 Weight(lbs): 145 Pulse(bpm): 26 Body  Mass Index(BMI): 23.4 Blood Pressure(mmHg): 137/80 Temperature(F): 99.2 Respiratory Rate(breaths/min): 18 Photos: [N/A:N/A] Right, Lateral Lower Leg N/A N/A Wound Location: Gradually Appeared N/A N/A Wounding Event: Diabetic Wound/Ulcer of the Lower N/A N/A Primary Etiology: Extremity Cataracts, Sleep Apnea, Congestive N/A N/A Comorbid History: Heart Failure, Hypertension, Myocardial Infarction, Type II Diabetes, Gout 08/15/2021 N/A N/A  Date Acquired: 2 N/A N/A Weeks of Treatment: Open N/A N/A Wound Status: No N/A N/A Wound Recurrence: 7.5x7.5x0.1 N/A N/A Measurements L x W x D (cm) 44.179 N/A N/A A (cm) : rea 4.418 N/A N/A Volume (cm) : -104.60% N/A N/A % Reduction in A rea: -104.50% N/A N/A % Reduction in Volume: Unable to visualize wound bed N/A N/A Classification: Medium N/A N/A Exudate A mount: Serosanguineous N/A N/A Exudate Type: red, brown N/A N/A Exudate Color: Distinct, outline attached N/A N/A Wound Margin: None Present (0%) N/A N/A Granulation A mount: Large (67-100%) N/A N/A Necrotic A mount: Eschar N/A N/A Necrotic Tissue: Fascia: No N/A N/A Exposed Structures: Fat Layer (Subcutaneous Tissue): No Tendon: No Muscle: No Joint: No Bone: No Small (1-33%) N/A N/A Epithelialization: Treatment Notes Electronic Signature(s) Signed: 11/17/2021 2:35:21 PM By: Kalman Shan DO Signed: 11/17/2021 4:18:35 PM By: Deon Pilling RN, BSN Entered By: Kalman Shan on 11/17/2021 12:29:42 -------------------------------------------------------------------------------- Multi-Disciplinary Care Plan Details Patient Name: Date of Service: [name], [name] 11/17/2021 10:30 A M Medical Record Number: [id number] Patient Account Number: [id number] Date of Birth/Sex: Treating RN: 1945-08-16 (76 y.o. [name] Primary Care Archer Vise: Billey Gosling Other Clinician: Referring Johanne Mcglade: Treating Jontae Sonier/Extender: Mickle Asper  in Treatment: 2 Active Inactive Necrotic Tissue Nursing Diagnoses: Knowledge deficit related to management of necrotic/devitalized tissue Goals: Necrotic/devitalized tissue will be minimized in the wound bed Date Initiated: 11/01/2021 Target Resolution Date: 12/09/2021 Goal Status: Active Patient/caregiver will verbalize understanding of reason and process for debridement of necrotic tissue Date Initiated: 11/01/2021 Target Resolution Date: 12/09/2021 Goal Status: Active Interventions: Assess patient pain level pre-, during and post procedure and prior to discharge Provide education on necrotic tissue and debridement process Treatment Activities: Apply topical anesthetic as ordered : 11/01/2021 Enzymatic debridement : 11/01/2021 Excisional debridement : 11/01/2021 Notes: Pain, Acute or Chronic Nursing Diagnoses: Pain, acute or chronic: actual or potential Potential alteration in comfort, pain Goals: Patient will verbalize adequate pain control and receive pain control interventions during procedures as needed Date Initiated: 11/01/2021 Target Resolution Date: 12/09/2021 Goal Status: Active Patient/caregiver will verbalize comfort level met Date Initiated: 11/01/2021 Target Resolution Date: 12/09/2021 Goal Status: Active Interventions: Complete pain assessment as per visit requirements Provide education on pain management Treatment Activities: Administer pain control measures as ordered : 11/01/2021 Notes: Wound/Skin Impairment Nursing Diagnoses: Knowledge deficit related to ulceration/compromised skin integrity Goals: Patient/caregiver will verbalize understanding of skin care regimen Date Initiated: 11/01/2021 Target Resolution Date: 12/09/2021 Goal Status: Active Interventions: Assess patient/caregiver ability to obtain necessary supplies Treatment Activities: Skin care regimen initiated : 11/01/2021 Topical wound management initiated : 11/01/2021 Notes: Electronic  Signature(s) Signed: 11/17/2021 4:18:35 PM By: Deon Pilling RN, BSN Entered By: Deon Pilling on 11/17/2021 11:14:46 -------------------------------------------------------------------------------- Pain Assessment Details Patient Name: Date of Service: [name], [name] 11/17/2021 10:30 A M Medical Record Number: [id number] Patient Account Number: [id number] Date of Birth/Sex: Treating RN: 1946-03-24 (76 y.o. [name] Primary Care Deaundra Kutzer: Billey Gosling Other Clinician: Referring Victoriya Pol: Treating Nikiah Goin/Extender: [name] in Treatment: 2 Active Problems Location of Pain Severity and Description of Pain Patient Has Paino No Site Locations Pain Management and Medication Current Pain Management: Electronic Signature(s) Signed: 11/17/2021 4:10:18 PM By: Erenest Blank Signed: 11/17/2021 4:18:35 PM By: Deon Pilling RN, BSN Entered By: Erenest Blank on 11/17/2021 11:05:46 -------------------------------------------------------------------------------- Patient/Caregiver Education Details Patient Name: Date of Service: Corey [name] 7/6/2023andnbsp10:30 A M Medical Record Number: [id number] Patient  Account Number: [id number] Date of Birth/Gender: Treating RN: 08-12-1945 (76 y.o. [name] Primary Care Physician: Billey Gosling Other Clinician: Referring Physician: Treating Physician/Extender: Mickle Asper in Treatment: 2 Education Assessment Education Provided To: Patient Education Topics Provided Infection: Handouts: CDC antimicrobial patient education_English, Infection Prevention and Management Methods: Explain/Verbal Responses: Reinforcements needed Electronic Signature(s) Signed: 11/17/2021 4:18:35 PM By: Deon Pilling RN, BSN Entered By: Deon Pilling on 11/17/2021 11:16:38 -------------------------------------------------------------------------------- Wound Assessment Details Patient Name: Date of  Service: [name], [name] 11/17/2021 10:30 A M Medical Record Number: [id number] Patient Account Number: [id number] Date of Birth/Sex: Treating RN: 17-Jan-1946 (76 y.o. [name] Primary Care Ajeet Casasola: Billey Gosling Other Clinician: Referring Chad Tiznado: Treating Deitra Craine/Extender: [name] in Treatment: 2 Wound Status Wound Number: 1 Primary Diabetic Wound/Ulcer of the Lower Extremity Etiology: Wound Location: Right, Lateral Lower Leg Wound Open Wounding Event: Gradually Appeared Status: Date Acquired: 08/15/2021 Comorbid Cataracts, Sleep Apnea, Congestive Heart Failure, Hypertension, Weeks Of Treatment: 2 History: Myocardial Infarction, Type II Diabetes, Gout Clustered Wound: No Photos Wound Measurements Length: (cm) 7.5 Width: (cm) 7.5 Depth: (cm) 0.1 Area: (cm) 44.179 Volume: (cm) 4.418 % Reduction in Area: -104.6% % Reduction in Volume: -104.5% Epithelialization: Small (1-33%) Tunneling: No Undermining: No Wound Description Classification: Unable to visualize wound bed Wound Margin: Distinct, outline attached Exudate Amount: Medium Exudate Type: Serosanguineous Exudate Color: red, brown Foul Odor After Cleansing: No Slough/Fibrino No Wound Bed Granulation Amount: None Present (0%) Exposed Structure Necrotic Amount: Large (67-100%) Fascia Exposed: No Necrotic Quality: Eschar Fat Layer (Subcutaneous Tissue) Exposed: No Tendon Exposed: No Muscle Exposed: No Joint Exposed: No Bone Exposed: No Electronic Signature(s) Signed: 11/17/2021 4:10:18 PM By: Erenest Blank Signed: 11/17/2021 4:18:35 PM By: Deon Pilling RN, BSN Entered By: Erenest Blank on 11/17/2021 11:17:11 -------------------------------------------------------------------------------- [place] Details Patient Name: Date of Service: [name], [name] 11/17/2021 10:30 A M Medical Record Number: [id number] Patient Account Number: [id number] Date of Birth/Sex: Treating  RN: March 22, 1946 (76 y.o. [name] Primary Care Calogero Geisen: Billey Gosling Other Clinician: Referring Eneida Evers: Treating Egon Dittus/Extender: [name] in Treatment: 2 Vital Signs Time Taken: 11:05 Temperature (F): 99.2 Height (in): 66 Pulse (bpm): 67 Weight (lbs): 145 Respiratory Rate (breaths/min): 18 Body Mass Index (BMI): 23.4 Blood Pressure (mmHg): 137/80 Capillary Blood Glucose (mg/dl): 120 Reference Range: 80 - 120 mg / dl Electronic Signature(s) Signed: 11/17/2021 4:10:18 PM By: Erenest Blank Entered By: Erenest Blank on 11/17/2021 11:05:41

## 2021-11-18 ENCOUNTER — Ambulatory Visit (INDEPENDENT_AMBULATORY_CARE_PROVIDER_SITE_OTHER): Payer: Medicare HMO | Admitting: *Deleted

## 2021-11-18 DIAGNOSIS — E1122 Type 2 diabetes mellitus with diabetic chronic kidney disease: Secondary | ICD-10-CM

## 2021-11-18 DIAGNOSIS — I5020 Unspecified systolic (congestive) heart failure: Secondary | ICD-10-CM

## 2021-11-18 NOTE — Patient Instructions (Signed)
Visit Information  Corey [name] and Corey [name], thank you for taking time to talk with me today about Corey [name]'s health care needs. I will ask our scheduling care guide to contact you to schedule your next telephone visit with the CCM RN CM   Below are the goals we discussed today:  Patient Self-Care Activities: Patient Corey [name] will: Take medications as prescribed Attend all scheduled provider appointments Call pharmacy for medication refills Call provider office for new concerns or questions Continue to check fasting (first thing in the morning, before eating) blood sugars at home: the blood sugars you reported today are all in very good range Try to occasionally check after-meal blood sugars, 2 hours after eating a regular size meal Continue to follow heart healthy, low salt, low cholesterol, carbohydrate-modified, low sugar diet Continue to monitor and record weights at home each day: Today you reported a weight of 145 lb Call office if I gain more than 2 pounds in one day or 5 pounds in one week, especially if this is associated with chest tightness, swelling in your legs/ ankles, and new or worsened shortness of breath Watch for swelling in feet, ankles and legs every day Keep legs up while sitting, and continue walking for exercise when the weather is nice outside Keep limiting the salt in your diet: please decrease your salt intake and do not add salt to your meals Keep up the great work preventing falls Continue going to the wound clinic and working with the home health team as long as they are involved in your care   Corey Rack, RN, BSN, Corey [name] [phone]: direct office  If you are experiencing a Mental Health or [place] or need someone to talk to, please  call the Suicide and Crisis Lifeline: 988 call the [place] National Suicide Prevention Lifeline: 516-865-5173 or TTY: 773-047-3987 TTY 314-488-1492) to talk to a  trained counselor call 1-800-273-TALK (toll free, 24 hour hotline) go to [facility] [address] [place] [phone]) call 911   Patient verbalizes understanding of instructions and care plan provided today and agrees to view in [place]. Active MyChart status and patient understanding of how to access instructions and care plan via MyChart confirmed with patient     Wound Care, Adult Taking care of your wound properly can help to prevent pain, infection, and scarring. It can also help your wound heal more quickly. Follow instructions from your health care provider about how to care for your wound. Supplies needed: Soap and water. Wound cleanser, saline, or germ-free (sterile) water. Gauze. If needed, a clean bandage (dressing) or other type of wound dressing material to cover or place in the wound. Follow your health care provider's instructions about what dressing supplies to use. Cream or topical ointment to apply to the wound, if told by your health care provider. How to care for your wound Cleaning the wound Ask your health care provider how to clean the wound. This may include: Using mild soap and water, a wound cleanser, saline, or sterile water. Using a clean gauze to pat the wound dry after cleaning it. Do not rub or scrub the wound. Dressing care Wash your hands with soap and water for at least 20 seconds before and after you change the dressing. If soap and water are not available, use hand sanitizer. Change your dressing as told by your health care provider. This may include: Cleaning or rinsing out (irrigating)  the wound. Application of cream or topical ointment, if told by your health care provider. Placing a dressing over the wound or in the wound (packing). Covering the wound with an outer dressing. Leave stitches (sutures), staples, skin glue, or adhesive strips in place. These skin closures may need to stay in place for 2 weeks  or longer. If adhesive strip edges start to loosen and curl up, you may trim the loose edges. Do not remove adhesive strips completely unless your health care provider tells you to do that. Ask your health care provider when you can leave the wound uncovered. Checking for infection Check your wound area every day for signs of infection. Check for: More redness, swelling, or pain. Fluid or blood. Warmth. Pus or a bad smell.  Follow these instructions at home Medicines If you were prescribed an antibiotic medicine, cream, or ointment, take or apply it as told by your health care provider. Do not stop using the antibiotic even if your condition improves. If you were prescribed pain medicine, take it 30 minutes before you do any wound care or as told by your health care provider. Take over-the-counter and prescription medicines only as told by your health care provider. Eating and drinking Eat a diet that includes protein, vitamin A, vitamin C, and other nutrient-rich foods to help the wound heal. Foods rich in protein include meat, fish, eggs, dairy, beans, and nuts. Foods rich in vitamin A include carrots and dark green, leafy vegetables. Foods rich in vitamin C include citrus fruits, tomatoes, broccoli, and peppers. Drink enough fluid to keep your urine pale yellow. General instructions Do not take baths, swim, or use a hot tub until your health care provider approves. Ask your health care provider if you may take showers. You may only be allowed to take sponge baths. Do not scratch or pick at the wound. Keep it covered as told by your health care provider. Return to your normal activities as told by your health care provider. Ask your health care provider what activities are safe for you. Protect your wound from the sun when you are outside for the first 6 months, or for as long as told by your health care provider. Cover up the scar area or apply sunscreen that has an SPF of at least 77. Do  not use any products that contain nicotine or tobacco. These products include cigarettes, chewing tobacco, and vaping devices, such as e-cigarettes. If you need help quitting, ask your health care provider. Keep all follow-up visits. This is important. Contact a health care provider if: You received a tetanus shot and you have swelling, severe pain, redness, or bleeding at the injection site. Your pain is not controlled with medicine. You have any of these signs of infection: More redness, swelling, or pain around the wound. Fluid or blood coming from the wound. Warmth coming from the wound. A fever or chills. You are nauseous or you vomit. You are dizzy. You have a new rash or hardness around the wound. Get help right away if: You have a red streak of skin near the area around your wound. Pus or a bad smell coming from the wound. Your wound has been closed with staples, sutures, skin glue, or adhesive strips and it begins to open up and separate. Your wound is bleeding, and the bleeding does not stop with gentle pressure. These symptoms may represent a serious problem that is an emergency. Do not wait to see if the symptoms will go away.  Get medical help right away. Call your local emergency services (911 in the [place] Do not drive yourself to the hospital. Summary Always wash your hands with soap and water for at least 20 seconds before and after changing your dressing. Change your dressing as told by your health care provider. To help with healing, eat foods that are rich in protein, vitamin A, vitamin C, and other nutrients. Check your wound every day for signs of infection. Contact your health care provider if you think that your wound is infected. This information is not intended to replace advice given to you by your health care provider. Make sure you discuss any questions you have with your health care provider. Document Revised: 09/07/2020 Document Reviewed: 09/07/2020 Elsevier  Patient Education  [place].

## 2021-11-18 NOTE — Chronic Care Management (AMB) (Signed)
Chronic Care Management   CCM RN Visit Note  11/18/2021 Name: Corey [name] MRN: [id number] DOB: 31-May-1945  Subjective: Corey [name] is a 76 y.o. year old male who is a primary care patient of Burns, [name], MD. The care management team was consulted for assistance with disease management and care coordination needs.    Engaged with patient by telephone for follow up visit in response to provider referral for case management and/or care coordination services.   Consent to Services:  The patient was given information about Chronic Care Management services, agreed to services, and gave verbal consent prior to initiation of services.  Please see initial visit note for detailed documentation.  Patient agreed to services and verbal consent obtained.   Assessment: Review of patient past medical history, allergies, medications, health status, including review of consultants reports, laboratory and other test data, was performed as part of comprehensive evaluation and provision of chronic care management services.   SDOH (Social Determinants of Health) assessments and interventions performed:  SDOH Interventions    Flowsheet Row Most Recent Value  SDOH Interventions   Food Insecurity Interventions Intervention Not Indicated  [continues to deny food insecurity]  Transportation Interventions Intervention Not Indicated  [family continuens to provide transportation]     CCM Care Plan  Allergies  Allergen Reactions   Hydrochlorothiazide Other (See Comments)    Gout , uncontrolled diabetes and renal insufficiency   Outpatient Encounter Medications as of 11/18/2021  Medication Sig   ACCU-CHEK AVIVA PLUS test strip TEST BLOOD SUGAR TWICE DAILY AS DIRECTED   ACCU-CHEK AVIVA PLUS test strip Use as instructed   albuterol (VENTOLIN HFA) 108 (90 Base) MCG/ACT inhaler Inhale 2 puffs into the lungs every 4 (four) hours as needed for wheezing or shortness of breath.   allopurinol (ZYLOPRIM) 100 MG tablet  TAKE 1/2 TABLET ONE TIME DAILY   aspirin EC 81 MG tablet Take 1 tablet (81 mg total) by mouth daily.   bismuth subsalicylate (PEPTO BISMOL) 262 MG/15ML suspension Take 30 mLs by mouth every 6 (six) hours as needed for indigestion.   colchicine (COLCRYS) 0.6 MG tablet Take 2 tabs once and then one tab one hour later as needed for gout   doxycycline (VIBRAMYCIN) 100 MG capsule Take 1 capsule (100 mg total) by mouth 2 (two) times daily.   glipiZIDE (GLUCOTROL XL) 2.5 MG 24 hr tablet Take 1 tablet (2.5 mg total) by mouth daily with breakfast.   hydrALAZINE (APRESOLINE) 50 MG tablet Take 2 tablets (100 mg total) by mouth 3 (three) times daily.   nitroGLYCERIN (NITROSTAT) 0.4 MG SL tablet Place 0.4 mg under the tongue every 5 (five) minutes as needed for chest pain.   rosuvastatin (CRESTOR) 40 MG tablet Take 1 tablet (40 mg total) by mouth daily.   sitaGLIPtin (JANUVIA) 25 MG tablet Take 1 tablet (25 mg total) by mouth daily.   Tafamidis (VYNDAMAX) 61 MG CAPS Take 61 mg by mouth daily.   torsemide (DEMADEX) 20 MG tablet Take 1 tablet (20 mg total) by mouth daily.   TRUEplus Lancets 33G MISC USE 4 (FOUR) TIMES DAILY   No facility-administered encounter medications on file as of 11/18/2021.   Patient Active Problem List   Diagnosis Date Noted   Leg wound, right, initial encounter 10/24/2021   Weakness of both lower extremities 10/24/2021   Chronic kidney disease (CKD), stage IV (severe) ([place]) 09/29/2021   Pure hypercholesterolemia 07/05/2021   Preoperative cardiovascular examination 04/04/2021   HFrEF (heart failure with  reduced ejection fraction) ([place]) 04/03/2021   Bilateral hearing loss 01/04/2021   Bilateral impacted cerumen 01/04/2021   Tympanic membrane perforation, left 01/04/2021   Physical deconditioning 10/16/2020   Hereditary cardiac amyloidosis ([place]) 09/30/2020   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   Progressive angina (HCC) - Atypical; DOE 09/20/2020   CAD (coronary artery  disease) 09/20/2020   Acute on chronic combined systolic and diastolic CHF (congestive heart failure) ([place]) 09/20/2020   Acute combined systolic and diastolic heart failure ([place]) 09/20/2020   Soft tissue mass 07/30/2020   Contracture of joint of finger of left hand 07/30/2020   DOE (dyspnea on exertion) 03/01/2019   Mild cognitive impairment 12/05/2018   Muscle twitching 07/23/2018   OSA (obstructive sleep apnea) 03/27/2018   Snores 01/21/2018   Poor balance 01/23/2017   Ankle swelling, left 09/06/2016   Bradycardia 01/28/2016   Weak urinary stream 12/31/2015   Ventral hernia without obstruction or gangrene 09/15/2015   Prostate cancer ([place]) 07/25/2015   Erectile dysfunction 05/04/2015   Urinary frequency 05/04/2015   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   Abnormal finding on MRI of brain 02/14/2015   Leg wound, right 01/08/2013   PEA (Pulseless electrical activity) ([place]) 12/03/2012   Diabetes ([place]) 06/30/2011   Gout 03/05/2007   Essential hypertension 03/05/2007   Conditions to be addressed/monitored:  CHF and DMII  Care Plan : RN Care Manager Plan of Care  Updates made by Knox Royalty, RN since 11/18/2021 12:00 AM     Problem: Chronic Disease Management Needs   Priority: High     Long-Range Goal: Ongoing adherence to established plan of care for long term chronic disease management   Start Date: 05/12/2021  Expected End Date: 05/12/2022  Priority: High  Note:   Current Barriers:  Chronic Disease Management support and education needs related to CHF and DMII Unable to independently manage medications: reports decreased vision due to glaucoma; wife and son assists in medication management Limited health literacy, hard of hearing in (L) ear: wife participates in all of patient medical affairs and is very supportive/ helpful Challenges following low salt diet  RNCM Clinical Goal(s):  Patient will demonstrate ongoing health management independence  as evidenced by DMII; CHF/ CKD- III        through collaboration with RN Care manager, provider, and care team.   Interventions: 1:1 collaboration with primary care provider regarding development and update of comprehensive plan of care as evidenced by provider attestation and co-signature Inter-disciplinary care team collaboration (see longitudinal plan of care) Evaluation of current treatment plan related to  self management and patient's adherence to plan as established by provider Review of patient status, including review of consultants reports, relevant laboratory and other test results, and medications completed SDOH updated: no new/ unmet concerns identified Pain assessment updated: denies pain today; reports intermittent minimal discomfort of (R) leg wound Falls assessment updated: continues to deny new/ recent falls x 12 months- currently not using assistive devices;  positive reinforcement provided with encouragement to continue efforts at fall prevention; previously provided education around fall risks/ prevention reinforced Medications discussed: reports spouse continues to manage medications and they deny current concerns/ issues/ questions around medications; endorses adherence to taking all medications as prescribed Confirm currently taking diuretic M-W-F Reviewed recent visit to [facility]: they confirm patient is taking antibiotics as prescribed Reviewed upcoming scheduled provider appointments: 11/21/21- wound clinic; 01/12/22- cardiology provider; patient/ caregiver confirm they are aware of all and have plans to attend as scheduled  Discussed plans with patient for ongoing care management follow up and provided patient with direct contact information for care management team: made patient and caregiver aware that with next outreach, new CCM RN CM may be outreaching; they request follow up again within the next 2 months; will make CCM scheduling care guide aware as an FYI; meanwhile, patient  and caregiver understand to contact PCP office for any needs that may arise  Heart Failure Interventions:  (Status: 11/18/21- Goal on Track (progressing): YES.)  Long Term Goal  Wt Readings from Last 3 Encounters:  10/24/21 157 lb (71.2 kg)  10/14/21 168 lb 3.2 oz (76.3 kg)  10/11/21 167 lb (75.8 kg)  Basic overview and discussion of pathophysiology of Heart Failure reviewed Provided education on low sodium diet Advised patient to weigh each morning after emptying bladder Discussed importance of daily weight and advised patient to weigh and record daily Reviewed role of diuretics in prevention of fluid overload and management of heart failure Discussed the importance of keeping all appointments with provider Provided patient with education about the role of exercise in the management of heart failure Confirmed patient attended recent PCP office visit 10/24/21, where he presented with chronic lower extremity wound; reviewed PCP office visit with he and caregiver/ spouse; they confirm currently attending wound clinic visits and actively participating in home health services for RN (wound assessment/ care) and PT through [facility] home health- reports visits going well; encouraged patient's ongoing engagement with home health team/ wound clinic visits as long as these interventions are in place Reviewed recent weights at home: he reports weight this morning of "145 lbs," and general weight ranges between "144-147 lbs" and denies weight gain > 3 lbs overnight/ 5 lbs in one week-- this is a great difference in patient's last reported weights to me 09/27/21 of "168-170 lbs;" he acknowledges that he heard from cardiology provider and took action as advised after our last outreach; positive reinforcement provided with encouragement to continue efforts to stay on top of fluid status at home; today, he denies clinical concerns and reports "breathing fine" and lower extremity swelling "better than it has been in the  past" Reinforced previously provided education around action plan for overnight weight gain > 3 lbs/ weekly > 5 lbs: call cardiology provider promptly; explained again that prompt action to address weight gain/ signs/ symptoms can prevent worsening of symptoms and possibly avoid hospitalization Reinforced signs/ symptoms yellow CHF zone reinforced: patient/ caregiver continue to benefit from ongoing reinforcement/ support/ education/ reminding and request ongoing follow up from RN CM in CCM program Reinforced importance of low salt, heart healthy diet and encouraged patient to be adherent to prescribed diet  Diabetes:  (Status: 11/18/21- Goal on Track (progressing): YES.) Long Term Goal   Lab Results  Component Value Date   HGBA1C 7.0 (H) 08/09/2021  Provided education to patient about basic DM disease process; Reviewed recent blood sugars at home with patient- he again reports consistent fasting values "between 115-120" and confirms again that he is not routinely monitoring post-prandial blood sugars recently; denies recent low blood sugars at home Reinforced previously provided education around signs/ symptoms hypoglycemia, along with corresponding action plan Confirmed patient continues to "try to eat right right;" positive reinforcement provided with encouragement to continue efforts  Patient Goals/Self-Care Activities: As evidenced by review of EHR, collaboration with care team, and patient reporting during CCM RN CM outreach,  Patient [place] will: Take medications as prescribed Attend all scheduled provider appointments Call pharmacy for  medication refills Call provider office for new concerns or questions Continue to check fasting (first thing in the morning, before eating) blood sugars at home: the blood sugars you reported today are all in very good range Try to occasionally check after-meal blood sugars, 2 hours after eating a regular size meal Continue to follow heart healthy, low  salt, low cholesterol, carbohydrate-modified, low sugar diet Continue to monitor and record weights at home each day: Today you reported a weight of 145 lb Call office if I gain more than 2 pounds in one day or 5 pounds in one week, especially if this is associated with chest tightness, swelling in your legs/ ankles, and new or worsened shortness of breath Watch for swelling in feet, ankles and legs every day Keep legs up while sitting, and continue walking for exercise when the weather is nice outside Keep limiting the salt in your diet: please decrease your salt intake and do not add salt to your meals Keep up the great work preventing falls Continue going to the wound clinic and working with the home health team as long as they are involved in your care      Plan: The care management team will reach out to the patient again over the next 60 days to ensure scheduling of next CCM RN CM telephone visit  [name], RN, BSN, [place] [phone]: direct office

## 2021-11-21 ENCOUNTER — Encounter (HOSPITAL_COMMUNITY): Payer: Medicare HMO

## 2021-11-21 ENCOUNTER — Encounter (HOSPITAL_BASED_OUTPATIENT_CLINIC_OR_DEPARTMENT_OTHER): Payer: Medicare HMO | Admitting: Internal Medicine

## 2021-11-21 DIAGNOSIS — L97918 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 with other specified severity: Secondary | ICD-10-CM | POA: Diagnosis not present

## 2021-11-21 DIAGNOSIS — B952 Enterococcus as the cause of diseases classified elsewhere: Secondary | ICD-10-CM | POA: Diagnosis not present

## 2021-11-21 DIAGNOSIS — E11622 Type 2 diabetes mellitus with other skin ulcer: Secondary | ICD-10-CM | POA: Diagnosis not present

## 2021-11-21 DIAGNOSIS — L080 Pyoderma: Secondary | ICD-10-CM | POA: Diagnosis not present

## 2021-11-21 DIAGNOSIS — E1122 Type 2 diabetes mellitus with diabetic chronic kidney disease: Secondary | ICD-10-CM | POA: Diagnosis not present

## 2021-11-21 DIAGNOSIS — B965 Pseudomonas (aeruginosa) (mallei) (pseudomallei) as the cause of diseases classified elsewhere: Secondary | ICD-10-CM | POA: Diagnosis not present

## 2021-11-21 DIAGNOSIS — I130 Hypertensive heart and chronic kidney disease with heart failure and stage 1 through stage 4 chronic kidney disease, or unspecified chronic kidney disease: Secondary | ICD-10-CM | POA: Diagnosis not present

## 2021-11-21 DIAGNOSIS — I5042 Chronic combined systolic (congestive) and diastolic (congestive) heart failure: Secondary | ICD-10-CM | POA: Diagnosis not present

## 2021-11-21 DIAGNOSIS — I25119 Atherosclerotic heart disease of native coronary artery with unspecified angina pectoris: Secondary | ICD-10-CM | POA: Diagnosis not present

## 2021-11-21 DIAGNOSIS — N184 Chronic kidney disease, stage 4 (severe): Secondary | ICD-10-CM | POA: Diagnosis not present

## 2021-11-21 NOTE — Progress Notes (Signed)
KODIAK, ROLLYSON ([id number]) Visit Report for 11/21/2021 Arrival Information Details Patient Name: Date of Service: Corey [name] 11/21/2021 8:15 A M Medical Record Number: [id number] Patient Account Number: [id number] Date of Birth/Sex: Treating RN: November 10, 1945 (76 y.o. Corey [name] Primary Care Kyliana Standen: Billey Gosling Other Clinician: Referring Avaiah Stempel: Treating Mikalah Skyles/Extender: Mickle Asper in Treatment: 2 Visit Information History Since Last Visit All ordered tests and consults were completed: Yes Patient Arrived: Ambulatory Added or deleted any medications: No Arrival Time: 08:10 Any new allergies or adverse reactions: No Accompanied By: wife Had a fall or experienced change in No Transfer Assistance: None activities of daily living that may affect Patient Identification Verified: Yes risk of falls: Secondary Verification Process Completed: Yes Signs or symptoms of abuse/neglect since last visito No Patient Requires Transmission-Based No Hospitalized since last visit: No Precautions: Implantable device outside of the clinic excluding No Patient Has Alerts: Yes cellular tissue based products placed in the center Patient Alerts: in clinic R [place] since last visit: 11/17/2021 ABIs: both [place] Has Dressing in Place as Prescribed: Yes 11/17/2021 TBI: R0.64 L0.58 Pain Present Now: No Electronic Signature(s) Signed: 11/21/2021 4:29:33 PM By: Deon Pilling RN, BSN Entered By: Deon Pilling on 11/21/2021 08:25:39 -------------------------------------------------------------------------------- Encounter Discharge Information Details Patient Name: Date of Service: Corey [name], [name] 11/21/2021 8:15 A M Medical Record Number: [id number] Patient Account Number: [id number] Date of Birth/Sex: Treating RN: 09/02/1945 (76 y.o. Corey [name] Primary Care Corey [name]: Billey Gosling Other Clinician: Referring Corey [name]: Treating Corey [name]/Extender: Mickle Asper in Treatment: 2 Encounter Discharge Information Items Post Procedure Vitals Discharge Condition: Stable Temperature (F): 98.1 Ambulatory Status: Ambulatory Pulse (bpm): 71 Discharge Destination: Home Respiratory Rate (breaths/min): 20 Transportation: Private Auto Blood Pressure (mmHg): 128/71 Accompanied By: wife Schedule Follow-up Appointment: Yes Clinical Summary of Care: Electronic Signature(s) Signed: 11/21/2021 4:29:33 PM By: Deon Pilling RN, BSN Entered By: Deon Pilling on 11/21/2021 09:02:42 -------------------------------------------------------------------------------- Lower Extremity Assessment Details Patient Name: Date of Service: Corey [name], [name] 11/21/2021 8:15 A M Medical Record Number: [id number] Patient Account Number: [id number] Date of Birth/Sex: Treating RN: 1945-07-13 (76 y.o. Corey [name] Primary Care Hakeem Frazzini: Billey Gosling Other Clinician: Referring Wyoma Genson: Treating Caylor Cerino/Extender: Corey [name] Weeks in Treatment: 2 Edema Assessment Assessed: [Left: No] [Right: Yes] Edema: [Left: Ye] [Right: s] Calf Left: Right: Point of Measurement: 33 cm From Medial Instep 37 cm Ankle Left: Right: Point of Measurement: 11 cm From Medial Instep 21.5 cm Vascular Assessment Pulses: Dorsalis Pedis Palpable: [Right:Yes] Electronic Signature(s) Signed: 11/21/2021 4:29:33 PM By: Deon Pilling RN, BSN Entered By: Deon Pilling on 11/21/2021 08:19:16 -------------------------------------------------------------------------------- Multi Wound Chart Details Patient Name: Date of Service: Corey [name], [name] 11/21/2021 8:15 A M Medical Record Number: [id number] Patient Account Number: [id number] Date of Birth/Sex: Treating RN: Feb 11, 1946 (76 y.o. Corey [name] Primary Care Corey [name]: Billey Gosling Other Clinician: Referring Corey [name]: Treating Whitman Meinhardt/Extender: Corey [name] Weeks in Treatment: 2 Vital  Signs Height(in): 66 Capillary Blood Glucose(mg/dl): 120 Weight(lbs): 145 Pulse(bpm): 49 Body Mass Index(BMI): 23.4 Blood Pressure(mmHg): 128/71 Temperature(F): 98.1 Respiratory Rate(breaths/min): 20 Photos: [N/A:N/A] Right, Lateral Lower Leg N/A N/A Wound Location: Gradually Appeared N/A N/A Wounding Event: Diabetic Wound/Ulcer of the Lower N/A N/A Primary Etiology: Extremity Cataracts, Sleep Apnea, Congestive N/A N/A Comorbid History: Heart Failure, Hypertension, Myocardial Infarction, Type II Diabetes, Gout 08/15/2021 N/A N/A Date Acquired: 2 N/A N/A Weeks of Treatment: Open N/A N/A Wound Status: No  N/A N/A Wound Recurrence: 10x9x0.1 N/A N/A Measurements L x W x D (cm) 70.686 N/A N/A A (cm) : rea 7.069 N/A N/A Volume (cm) : -227.30% N/A N/A % Reduction in A rea: -227.30% N/A N/A % Reduction in Volume: Grade 2 N/A N/A Classification: Large N/A N/A Exudate A mount: Purulent N/A N/A Exudate Type: yellow, brown, green N/A N/A Exudate Color: Yes N/A N/A Foul Odor A Cleansing: fter No N/A N/A Odor A nticipated Due to Product Use: Distinct, outline attached N/A N/A Wound Margin: Small (1-33%) N/A N/A Granulation A mount: Pink N/A N/A Granulation Quality: Large (67-100%) N/A N/A Necrotic A mount: Eschar, Adherent Slough N/A N/A Necrotic Tissue: Fat Layer (Subcutaneous Tissue): Yes N/A N/A Exposed Structures: Fascia: No Tendon: No Muscle: No Joint: No Bone: No None N/A N/A Epithelialization: Debridement - Selective/Open Wound N/A N/A Debridement: Pre-procedure Verification/Time Out 08:50 N/A N/A Taken: Lidocaine 4% Topical Solution N/A N/A Pain Control: Slough N/A N/A Tissue Debrided: Non-Viable Tissue N/A N/A Level: 48 N/A N/A Debridement A (sq cm): rea Curette N/A N/A Instrument: Minimum N/A N/A Bleeding: 0 N/A N/A Procedural Pain: 0 N/A N/A Post Procedural Pain: Procedure was tolerated well N/A N/A Debridement Treatment  Response: 10x9x0.1 N/A N/A Post Debridement Measurements L x W x D (cm) 7.069 N/A N/A Post Debridement Volume: (cm) Debridement N/A N/A Procedures Performed: Treatment Notes Wound #1 (Lower Leg) Wound Laterality: Right, Lateral Cleanser Normal Saline Discharge Instruction: Cleanse the wound with Normal Saline prior to applying a clean dressing using gauze sponges, not tissue or cotton balls. Soap and Water Discharge Instruction: May shower and wash wound with dial antibacterial soap and water prior to dressing change. Wound Cleanser Discharge Instruction: Cleanse the wound with wound cleanser prior to applying a clean dressing using gauze sponges, not tissue or cotton balls. Peri-Wound Care Topical Primary Dressing IODOFLEX 0.9% Cadexomer Iodine Pad 4x6 cm Discharge Instruction: Apply to wound bed as instructed Secondary Dressing ABD Pad, 8x10 Discharge Instruction: Apply over primary dressing as directed. CarboFLEX Odor Control Dressing, 4x4 in Discharge Instruction: Apply over primary dressing as directed. Zetuvit Plus 4x8 in Discharge Instruction: Apply over primary dressing as directed. Secured With Compression Wrap Kerlix Roll 4.5x3.1 (in/yd) Discharge Instruction: Apply Kerlix and Coban compression as directed. Coban Self-Adherent Wrap 4x5 (in/yd) Discharge Instruction: Apply over Kerlix as directed. Compression Stockings Add-Ons Electronic Signature(s) Signed: 11/21/2021 9:53:58 AM By: Kalman Shan DO Signed: 11/21/2021 4:29:33 PM By: Deon Pilling RN, BSN Entered By: Kalman Shan on 11/21/2021 09:50:42 -------------------------------------------------------------------------------- Multi-Disciplinary Care Plan Details Patient Name: Date of Service: Corey [name], [name] 11/21/2021 8:15 A M Medical Record Number: [id number] Patient Account Number: [id number] Date of Birth/Sex: Treating RN: August 02, 1945 (76 y.o. Corey [name] Primary Care Perel Hauschild: Billey Gosling Other Clinician: Referring Dyland Panuco: Treating Giavonni Fonder/Extender: Mickle Asper in Treatment: 2 Active Inactive Necrotic Tissue Nursing Diagnoses: Knowledge deficit related to management of necrotic/devitalized tissue Goals: Necrotic/devitalized tissue will be minimized in the wound bed Date Initiated: 11/01/2021 Target Resolution Date: 12/09/2021 Goal Status: Active Patient/caregiver will verbalize understanding of reason and process for debridement of necrotic tissue Date Initiated: 11/01/2021 Target Resolution Date: 12/09/2021 Goal Status: Active Interventions: Assess patient pain level pre-, during and post procedure and prior to discharge Provide education on necrotic tissue and debridement process Treatment Activities: Apply topical anesthetic as ordered : 11/01/2021 Enzymatic debridement : 11/01/2021 Excisional debridement : 11/01/2021 Notes: Pain, Acute or Chronic Nursing Diagnoses: Pain, acute or chronic: actual or potential Potential alteration in  comfort, pain Goals: Patient will verbalize adequate pain control and receive pain control interventions during procedures as needed Date Initiated: 11/01/2021 Date Inactivated: 11/21/2021 Target Resolution Date: 12/09/2021 Goal Status: Met Patient/caregiver will verbalize comfort level met Date Initiated: 11/01/2021 Target Resolution Date: 12/09/2021 Goal Status: Active Interventions: Complete pain assessment as per visit requirements Provide education on pain management Treatment Activities: Administer pain control measures as ordered : 11/01/2021 Notes: Wound/Skin Impairment Nursing Diagnoses: Knowledge deficit related to ulceration/compromised skin integrity Goals: Patient/caregiver will verbalize understanding of skin care regimen Date Initiated: 11/01/2021 Target Resolution Date: 12/09/2021 Goal Status: Active Interventions: Assess patient/caregiver ability to obtain necessary  supplies Treatment Activities: Skin care regimen initiated : 11/01/2021 Topical wound management initiated : 11/01/2021 Notes: Electronic Signature(s) Signed: 11/21/2021 4:29:33 PM By: Deon Pilling RN, BSN Entered By: Deon Pilling on 11/21/2021 08:25:56 -------------------------------------------------------------------------------- Pain Assessment Details Patient Name: Date of Service: Corey [name], [name] 11/21/2021 8:15 A M Medical Record Number: [id number] Patient Account Number: [id number] Date of Birth/Sex: Treating RN: 1946/05/02 (76 y.o. Corey [name] Primary Care Jack Mineau: Billey Gosling Other Clinician: Referring Avina Eberle: Treating Nyiah Pianka/Extender: Corey [name] Weeks in Treatment: 2 Active Problems Location of Pain Severity and Description of Pain Patient Has Paino Yes Site Locations Pain Location: Pain in Ulcers Rate the pain. Current Pain Level: 4 Character of Pain Describe the Pain: Burning Pain Management and Medication Current Pain Management: Medication: Yes Cold Application: No Rest: Yes Massage: No Activity: No T.E.N.S.: No Heat Application: No Leg drop or elevation: No Is the Current Pain Management Adequate: Inadequate How does your wound impact your activities of daily livingo Sleep: No Bathing: No Appetite: No Relationship With Others: No Bladder Continence: No Emotions: No Bowel Continence: No Work: No Toileting: No Drive: No Dressing: No Hobbies: No Engineer, maintenance) Signed: 11/21/2021 4:29:33 PM By: Deon Pilling RN, BSN Entered By: Deon Pilling on 11/21/2021 08:16:00 -------------------------------------------------------------------------------- Patient/Caregiver Education Details Patient Name: Date of Service: Corey [name] 7/10/2023andnbsp8:15 A M Medical Record Number: [id number] Patient Account Number: [id number] Date of Birth/Gender: Treating RN: 08-Jan-1946 (76 y.o. Corey [name] Primary Care  Physician: Billey Gosling Other Clinician: Referring Physician: Treating Physician/Extender: Mickle Asper in Treatment: 2 Education Assessment Education Provided To: Patient and Caregiver Education Topics Provided Infection: Handouts: Infection Prevention and Management Methods: Explain/Verbal Responses: Reinforcements needed Electronic Signature(s) Signed: 11/21/2021 4:29:33 PM By: Deon Pilling RN, BSN Entered By: Deon Pilling on 11/21/2021 08:26:07 -------------------------------------------------------------------------------- Wound Assessment Details Patient Name: Date of Service: Corey [name], [name] 11/21/2021 8:15 A M Medical Record Number: [id number] Patient Account Number: [id number] Date of Birth/Sex: Treating RN: [birth year] (76 y.o. Corey [name] Primary Care Zanasia Hickson: Billey Gosling Other Clinician: Referring Shelbey Spindler: Treating Kemontae Dunklee/Extender: Corey [name] Weeks in Treatment: 2 Wound Status Wound Number: 1 Primary Diabetic Wound/Ulcer of the Lower Extremity Etiology: Wound Location: Right, Lateral Lower Leg Wound Open Wounding Event: Gradually Appeared Wounding Event: Gradually Appeared Status: Date Acquired: 08/15/2021 Comorbid Cataracts, Sleep Apnea, Congestive Heart Failure, Hypertension, Weeks Of Treatment: 2 History: Myocardial Infarction, Type II Diabetes, Gout Clustered Wound: No Photos Wound Measurements Length: (cm) 10 Width: (cm) 9 Depth: (cm) 0.1 Area: (cm) 70.686 Volume: (cm) 7.069 % Reduction in Area: -227.3% % Reduction in Volume: -227.3% Epithelialization: None Tunneling: No Undermining: No Wound Description Classification: Grade 2 Wound Margin: Distinct, outline attached Exudate Amount: Large Exudate Type: Purulent Exudate Color: yellow, brown, green Foul Odor After Cleansing: Yes Due to Product Use: No  Slough/Fibrino Yes Wound Bed Granulation Amount: Small (1-33%) Exposed  Structure Granulation Quality: Pink Fascia Exposed: No Necrotic Amount: Large (67-100%) Fat Layer (Subcutaneous Tissue) Exposed: Yes Necrotic Quality: Eschar, Adherent Slough Tendon Exposed: No Muscle Exposed: No Joint Exposed: No Bone Exposed: No Treatment Notes Wound #1 (Lower Leg) Wound Laterality: Right, Lateral Cleanser Normal Saline Discharge Instruction: Cleanse the wound with Normal Saline prior to applying a clean dressing using gauze sponges, not tissue or cotton balls. Soap and Water Discharge Instruction: May shower and wash wound with dial antibacterial soap and water prior to dressing change. Wound Cleanser Discharge Instruction: Cleanse the wound with wound cleanser prior to applying a clean dressing using gauze sponges, not tissue or cotton balls. Peri-Wound Care Topical Primary Dressing IODOFLEX 0.9% Cadexomer Iodine Pad 4x6 cm Discharge Instruction: Apply to wound bed as instructed Secondary Dressing ABD Pad, 8x10 Discharge Instruction: Apply over primary dressing as directed. CarboFLEX Odor Control Dressing, 4x4 in Discharge Instruction: Apply over primary dressing as directed. Zetuvit Plus 4x8 in Discharge Instruction: Apply over primary dressing as directed. Secured With Compression Wrap Kerlix Roll 4.5x3.1 (in/yd) Discharge Instruction: Apply Kerlix and Coban compression as directed. Coban Self-Adherent Wrap 4x5 (in/yd) Discharge Instruction: Apply over Kerlix as directed. Compression Stockings Add-Ons Electronic Signature(s) Signed: 11/21/2021 4:29:33 PM By: Deon Pilling RN, BSN Entered By: Deon Pilling on 11/21/2021 08:20:23 -------------------------------------------------------------------------------- Vitals Details Patient Name: Date of Service: Corey [name], [name] 11/21/2021 8:15 A M Medical Record Number: [id number] Patient Account Number: [id number] Date of Birth/Sex: Treating RN: May 15, 1946 (76 y.o. Corey [name] Primary Care Talyn Dessert:  Billey Gosling Other Clinician: Referring Averyana Pillars: Treating Breylen Agyeman/Extender: Corey [name] Weeks in Treatment: 2 Vital Signs Time Taken: 08:10 Temperature (F): 98.1 Height (in): 66 Pulse (bpm): 71 Weight (lbs): 145 Respiratory Rate (breaths/min): 20 Body Mass Index (BMI): 23.4 Blood Pressure (mmHg): 128/71 Capillary Blood Glucose (mg/dl): 120 Reference Range: 80 - 120 mg / dl Electronic Signature(s) Signed: 11/21/2021 4:29:33 PM By: Deon Pilling RN, BSN Entered By: Deon Pilling on 11/21/2021 08:14:53

## 2021-11-21 NOTE — Progress Notes (Signed)
DREYDON, CARDENAS ([id number]) Visit Report for 11/21/2021 Chief Complaint Document Details Patient Name: Date of Service: Corey [name] 11/21/2021 8:15 A M Medical Record Number: [id number] Patient Account Number: [id number] Date of Birth/Sex: Treating RN: [birth year] (76 y.o. [name] Primary Care Provider: Billey Gosling Other Clinician: Referring Provider: Treating Provider/Extender: [name] in Treatment: 2 Information Obtained from: Patient Chief Complaint 11/01/2021; right lower extremity wound Electronic Signature(s) Signed: 11/21/2021 9:53:58 AM By: Kalman Shan DO Entered By: Kalman Shan on 11/21/2021 09:50:47 -------------------------------------------------------------------------------- Debridement Details Patient Name: Date of Service: Corey [name], [name] 11/21/2021 8:15 A M Medical Record Number: [id number] Patient Account Number: [id number] Date of Birth/Sex: Treating RN: 08/17/1945 (76 y.o. [name] Primary Care Provider: Billey Gosling Other Clinician: Referring Provider: Treating Provider/Extender: [name] in Treatment: 2 Debridement Performed for Assessment: Wound #1 Right,Lateral Lower Leg Performed By: Physician Kalman Shan, DO Debridement Type: Debridement Severity of Tissue Pre Debridement: Fat layer exposed Level of Consciousness (Pre-procedure): Awake and Alert Pre-procedure Verification/Time Out Yes - 08:50 Taken: Start Time: 08:51 Pain Control: Lidocaine 4% T opical Solution T Area Debrided (L x W): otal 8 (cm) x 6 (cm) = 48 (cm) Tissue and other material debrided: Non-Viable, Slough, Slough Level: Non-Viable Tissue Debridement Description: Selective/Open Wound Instrument: Curette Bleeding: Minimum End Time: 08:55 Procedural Pain: 0 Post Procedural Pain: 0 Response to Treatment: Procedure was tolerated well Level of Consciousness (Post- Awake and  Alert procedure): Post Debridement Measurements of Total Wound Length: (cm) 10 Width: (cm) 9 Depth: (cm) 0.1 Volume: (cm) 7.069 Character of Wound/Ulcer Post Debridement: Requires Further Debridement Severity of Tissue Post Debridement: Fat layer exposed Post Procedure Diagnosis Same as Pre-procedure Electronic Signature(s) Signed: 11/21/2021 9:53:58 AM By: Kalman Shan DO Signed: 11/21/2021 4:29:33 PM By: Deon Pilling RN, BSN Entered By: Deon Pilling on 11/21/2021 08:55:53 -------------------------------------------------------------------------------- HPI Details Patient Name: Date of Service: Corey [name], [name] 11/21/2021 8:15 A M Medical Record Number: [id number] Patient Account Number: [id number] Date of Birth/Sex: Treating RN: Jun 11, 1945 (76 y.o. [name] Primary Care Provider: Billey Gosling Other Clinician: Referring Provider: Treating Provider/Extender: [name] in Treatment: 2 History of Present Illness HPI Description: Admission 11/01/2021 Corey [name] Is a 76 year old male with a past medical history of of type 2 diabetes, coronary artery disease, stage IV kidney disease acute on chronic combined systolic and diastolic heart failure that presents to the clinic for a 60-monthhistory of right lower extremity wound. He states he is not sure how this started. It is completely eschared. He reports his wound has been stable for the past month. He has been keeping the area covered. He has mild chronic pain. He denies signs of infection. He states he has a history of lower extremity ulcers last being on the left leg. He could not tell me how this was treated in the past. 6/27; patient presents for follow-up. He is scheduled for his lower extremity arterial studies on July 6th. He has been using Medihoney to the wound bed. He has no issues or complaints today. He denies signs of infection. 7/6; patient presents for follow-up. He has home  health that reported the wound having odor and increased pain last week. He was sent to urgent care. He was started on doxycycline. He is continuing Medihoney. He has his scheduled ABIs w/TBIs today. 7/10; patient presents for follow-up. He had his arterial studies done last week. He  had triphasic waveforms throughout the right lower extremity. His TBI was 0.64. He has good pedal pulses. He is almost done with his course of antibiotics. He has been using mupirocin ointment to the wound bed. Electronic Signature(s) Signed: 11/21/2021 9:53:58 AM By: Kalman Shan DO Entered By: Kalman Shan on 11/21/2021 09:51:37 -------------------------------------------------------------------------------- Physical Exam Details Patient Name: Date of Service: Corey [name], [name] 11/21/2021 8:15 A M Medical Record Number: [id number] Patient Account Number: [id number] Date of Birth/Sex: Treating RN: 19-Jun-1945 (76 y.o. [name] Primary Care Provider: Billey Gosling Other Clinician: Referring Provider: Treating Provider/Extender: [name] in Treatment: 2 Constitutional respirations regular, non-labored and within target range for patient.. Cardiovascular 2+ dorsalis pedis/posterior tibialis pulses. Psychiatric pleasant and cooperative. Notes Right lower extremity: T the anterior aspect there is a wound with tightly adhered necrotic tissue and scant granulation tissue. No signs of surrounding o infection. Electronic Signature(s) Signed: 11/21/2021 9:53:58 AM By: Kalman Shan DO Entered By: Kalman Shan on 11/21/2021 09:51:59 -------------------------------------------------------------------------------- Physician Orders Details Patient Name: Date of Service: Corey [name], [name] 11/21/2021 8:15 A M Medical Record Number: [id number] Patient Account Number: [id number] Date of Birth/Sex: Treating RN: 07-11-1945 (76 y.o. [name] Primary Care Provider: Billey Gosling Other Clinician: Referring Provider: Treating Provider/Extender: Mickle Asper in Treatment: 2 Verbal / Phone Orders: No Diagnosis Coding ICD-10 Coding Code Description L97.918 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 with other specified severity E11.622 Type 2 diabetes mellitus with other skin ulcer I50.42 Chronic combined systolic (congestive) and diastolic (congestive) heart failure I25.119 Atherosclerotic heart disease of native coronary artery with unspecified angina pectoris Follow-up Appointments ppointment in 1 week. - Dr. Heber [place] and [place], [place] Room 6 Monday 11/28/2021 1230 Return A Nurse Visit: - This Thursday overflow room 6 0830 11/24/2021 - Please have Dr. Heber [place] check in to see wound bed. Other: - culture PCR right leg. will call you with results. If positive will send culture results to [facility] who will call you to purchase the topical compounding antibiotics and once it arrives bring to appt each time. Bathing/ Shower/ Hygiene May shower with protection but do not get wound dressing(s) wet. Edema Control - Lymphedema / SCD / Other Elevate legs to the level of the heart or above for 30 minutes daily and/or when sitting, a frequency of: - 3-4 times a day throughout the day. Avoid standing for long periods of time. Home Health New wound care orders this week; continue Home Health for wound care. May utilize formulary equivalent dressing for wound treatment orders unless otherwise specified. - home health to hold wound care for this week. Will resume next week. Other Home Health Orders/Instructions: - [facility] Wound Treatment Wound #1 - Lower Leg Wound Laterality: Right, Lateral Cleanser: Normal Saline 2 x Per Week/30 Days Discharge Instructions: Cleanse the wound with Normal Saline prior to applying a clean dressing using gauze sponges, not tissue or cotton balls. Cleanser: Soap and Water 2 x  Per Week/30 Days Discharge Instructions: May shower and wash wound with dial antibacterial soap and water prior to dressing change. Cleanser: Wound Cleanser 2 x Per Week/30 Days Discharge Instructions: Cleanse the wound with wound cleanser prior to applying a clean dressing using gauze sponges, not tissue or cotton balls. Prim Dressing: IODOFLEX 0.9% Cadexomer Iodine Pad 4x6 cm 2 x Per Week/30 Days ary Discharge Instructions: Apply to wound bed as instructed Secondary Dressing: ABD Pad, 8x10 2  x Per Week/30 Days Discharge Instructions: Apply over primary dressing as directed. Secondary Dressing: CarboFLEX Odor Control Dressing, 4x4 in 2 x Per Week/30 Days Discharge Instructions: Apply over primary dressing as directed. Secondary Dressing: Zetuvit Plus 4x8 in 2 x Per Week/30 Days Discharge Instructions: Apply over primary dressing as directed. Compression Wrap: Kerlix Roll 4.5x3.1 (in/yd) 2 x Per Week/30 Days Discharge Instructions: Apply Kerlix and Coban compression as directed. Compression Wrap: Coban Self-Adherent Wrap 4x5 (in/yd) 2 x Per Week/30 Days Discharge Instructions: Apply over Kerlix as directed. Laboratory erobe culture (MICRO) - culture PCR right leg. will call you with results. If positive will send Bacteria identified in Unspecified specimen by A culture results to [facility] who will call you to purchase the topical compounding antibiotics and once it arrives bring to appt each time. LOINC Code: 253-6 Convenience Name: Aerobic culture-specimen not specified Electronic Signature(s) Signed: 11/21/2021 9:53:58 AM By: Kalman Shan DO Entered By: Kalman Shan on 11/21/2021 09:52:05 Prescription 11/21/2021 -------------------------------------------------------------------------------- [name]. Kalman Shan DO Patient Name: Provider: 10-02-1945 [id number] Date of Birth: NPI#: Jerilynn Mages [id number] Sex: DEA #: [phone] Phone #: License  #: [place] Patient Address: [address] [place], [place] [address] [place], [place] [address] [phone] Allergies hydrochlorothiazide Provider's Orders erobe culture - culture PCR right leg. will call you with results. If positive will send culture results Bacteria identified in Unspecified specimen by A to [facility] who will call you to purchase the topical compounding antibiotics and once it arrives bring to appt each time. LOINC Code: 237-6 Convenience Name: Aerobic culture-specimen not specified Hand Signature: Date(s): Electronic Signature(s) Signed: 11/21/2021 9:53:58 AM By: Kalman Shan DO Entered By: Kalman Shan on 11/21/2021 09:52:05 -------------------------------------------------------------------------------- Problem List Details Patient Name: Date of Service: Corey [name], [name] 11/21/2021 8:15 A M Medical Record Number: [id number] Patient Account Number: [id number] Date of Birth/Sex: Treating RN: 09/10/1945 (76 y.o. [name] Primary Care Provider: Billey Gosling Other Clinician: Referring Provider: Treating Provider/Extender: Mickle Asper in Treatment: 2 Active Problems ICD-10 Encounter Code Description Active Date MDM Diagnosis L97.918 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 with other 11/01/2021 No Yes specified severity E11.622 Type 2 diabetes mellitus with other skin ulcer 11/01/2021 No Yes I50.42 Chronic combined systolic (congestive) and diastolic (congestive) heart failure 11/01/2021 No Yes I25.119 Atherosclerotic heart disease of native coronary artery with unspecified angina 11/01/2021 No Yes pectoris Inactive Problems Resolved Problems Electronic Signature(s) Signed: 11/21/2021 9:53:58 AM By: Kalman Shan DO Entered By: Kalman Shan on 11/21/2021  09:50:36 -------------------------------------------------------------------------------- Progress Note Details Patient Name: Date of Service: Corey [name], [name] 11/21/2021 8:15 A M Medical Record Number: [id number] Patient Account Number: [id number] Date of Birth/Sex: Treating RN: 1945/09/21 (76 y.o. [name] Primary Care Provider: Billey Gosling Other Clinician: Referring Provider: Treating Provider/Extender: Mickle Asper in Treatment: 2 Subjective Chief Complaint Information obtained from Patient 11/01/2021; right lower extremity wound History of Present Illness (HPI) Admission 11/01/2021 [name] Is a 76 year old male with a past medical history of of type 2 diabetes, coronary artery disease, stage IV kidney disease acute on chronic combined systolic and diastolic heart failure that presents to the clinic for a 94-monthhistory of right lower extremity wound. He states he is not sure how this started. It is completely eschared. He reports his wound has been stable for the past month. He has been  keeping the area covered. He has mild chronic pain. He denies signs of infection. He states he has a history of lower extremity ulcers last being on the left leg. He could not tell me how this was treated in the past. 6/27; patient presents for follow-up. He is scheduled for his lower extremity arterial studies on July 6th. He has been using Medihoney to the wound bed. He has no issues or complaints today. He denies signs of infection. 7/6; patient presents for follow-up. He has home health that reported the wound having odor and increased pain last week. He was sent to urgent care. He was started on doxycycline. He is continuing Medihoney. He has his scheduled ABIs w/TBIs today. 7/10; patient presents for follow-up. He had his arterial studies done last week. He had triphasic waveforms throughout the right lower extremity. His TBI was 0.64. He has good pedal  pulses. He is almost done with his course of antibiotics. He has been using mupirocin ointment to the wound bed. Patient History Family History Cancer - Father, Diabetes - Mother,Father, Heart Disease - Mother,Siblings, Hypertension - Mother,Father. Social History Former smoker - quit 1970, Marital Status - Married, Drug Use - No History, Caffeine Use - Moderate - coffee. Medical History Eyes Patient has history of Cataracts Respiratory Patient has history of Sleep Apnea - has CPAP does not wear Cardiovascular Patient has history of Congestive Heart Failure, Hypertension, Myocardial Infarction - 2014 Endocrine Patient has history of Type II Diabetes Musculoskeletal Patient has history of Gout Neurologic Denies history of Seizure Disorder Hospitalization/Surgery History - 06/2021 inguinal hernia repair. - cardioversion 09/2020. - ventral hernia repair 3/19. 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 Vitals Time Taken: 8:10 AM, Height: 66 in, Weight: 145 lbs, BMI: 23.4, Temperature: 98.1 F, Pulse: 71 bpm, Respiratory Rate: 20 breaths/min, Blood Pressure: 128/71 mmHg, Capillary Blood Glucose: 120 mg/dl. Cardiovascular 2+ dorsalis pedis/posterior tibialis pulses. Psychiatric pleasant and cooperative. General Notes: Right lower extremity: T the anterior aspect there is a wound with tightly adhered necrotic tissue and scant granulation tissue. No signs of o surrounding infection. Integumentary (Hair, Skin) Wound #1 status is Open. Original cause of wound was Gradually Appeared. The date acquired was: 08/15/2021. The wound has been in treatment 2 weeks. The wound is located on the Right,Lateral Lower Leg. The wound measures 10cm  length x 9cm width x 0.1cm depth; 70.686cm^2 area and 7.069cm^3 volume. There is Fat Layer (Subcutaneous Tissue) exposed. There is no tunneling or undermining noted. There is a large amount of purulent drainage noted. Foul odor after cleansing was noted. The wound margin is distinct with the outline attached to the wound base. There is small (1-33%) pink granulation within the wound bed. There is a large (67-100%) amount of necrotic tissue within the wound bed including Eschar and Adherent Slough. 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 Type 2 diabetes mellitus with other skin ulcer Chronic combined systolic (congestive) and diastolic (congestive) heart failure Atherosclerotic heart disease of native coronary artery with unspecified angina pectoris Patient had arterial studies that show adequate blood flow for wound healing. His wounds etiology is from venous insufficiency. I debrided nonviable tissue. At this time I recommended Iodoflex under Kerlix/Coban. I think he would also do well with [place] antibiotic and a PCR  culture was obtained today. I like him to follow-up later this week to assure that he had no issues with the wrap. Procedures Wound #1 Pre-procedure diagnosis of Wound #1 is a Diabetic Wound/Ulcer of the Lower Extremity located on the Right,Lateral Lower Leg .Severity of Tissue Pre Debridement is: Fat layer exposed. There was a Selective/Open Wound Non-Viable Tissue Debridement with a total area of 48 sq cm performed by Kalman Shan, DO. With the following instrument(s): Curette to remove Non-Viable tissue/material. Material removed includes Slough after achieving pain control using Lidocaine 4% T opical Solution. A time out was conducted at 08:50, prior to the start of the procedure. A Minimum amount of bleeding was controlled with N/A. The procedure was tolerated well with a pain level  of 0 throughout and a pain level of 0 following the procedure. Post Debridement Measurements: 10cm length x 9cm width x 0.1cm depth; 7.069cm^3 volume. Character of Wound/Ulcer Post Debridement requires further debridement. Severity of Tissue Post Debridement is: Fat layer exposed. Post procedure Diagnosis Wound #1: Same as Pre-Procedure Plan Follow-up Appointments: Return Appointment in 1 week. - Dr. Heber [place] and [place], Overflow Room 6 Monday 11/28/2021 1230 Nurse Visit: - This Thursday overflow room 6 0830 11/24/2021 - Please have Dr. Heber [place] check in to see wound bed. Other: - culture PCR right leg. will call you with results. If positive will send culture results to [facility] who will call you to purchase the topical compounding antibiotics and once it arrives bring to appt each time. Bathing/ Shower/ Hygiene: May shower with protection but do not get wound dressing(s) wet. Edema Control - Lymphedema / SCD / Other: Elevate legs to the level of the heart or above for 30 minutes daily and/or when sitting, a frequency of: - 3-4 times a day throughout the day. Avoid standing for long periods of time. Home Health: New wound care orders this week; continue Home Health for wound care. May utilize formulary equivalent dressing for wound treatment orders unless otherwise specified. - home health to hold wound care for this week. Will resume next week. Other Home Health Orders/Instructions: - [facility] Laboratory ordered were: Aerobic culture-specimen not specified - culture PCR right leg. will call you with results. If positive will send culture results to [facility] who will call you to purchase the topical compounding antibiotics and once it arrives bring to appt each time. WOUND #1: - Lower Leg Wound Laterality: Right, Lateral Cleanser: Normal Saline 2 x Per Week/30 Days Discharge Instructions: Cleanse the wound with Normal Saline prior to applying a clean dressing using  gauze sponges, not tissue or cotton balls. Cleanser: Soap and Water 2 x Per Week/30 Days Discharge Instructions: May shower and wash wound with dial antibacterial soap and water prior to dressing change. Cleanser: Wound Cleanser 2 x Per Week/30 Days Discharge Instructions: Cleanse the wound with wound cleanser prior to applying a clean dressing using gauze sponges, not tissue or cotton balls. Prim Dressing: IODOFLEX 0.9% Cadexomer Iodine Pad 4x6 cm 2 x Per Week/30 Days ary Discharge Instructions: Apply to wound bed as instructed Secondary Dressing: ABD Pad, 8x10 2 x Per Week/30 Days Discharge Instructions: Apply over primary dressing as directed. Secondary Dressing: CarboFLEX Odor Control Dressing, 4x4 in 2 x Per Week/30 Days Discharge Instructions: Apply over primary dressing as directed. Secondary Dressing: Zetuvit Plus 4x8 in 2 x Per Week/30 Days Discharge Instructions: Apply over primary dressing as directed. Com pression Wrap: Kerlix Roll 4.5x3.1 (in/yd) 2 x Per Week/30 Days Discharge Instructions:  Apply Kerlix and Coban compression as directed. Com pression Wrap: Coban Self-Adherent Wrap 4x5 (in/yd) 2 x Per Week/30 Days Discharge Instructions: Apply over Kerlix as directed. 1. In office sharp debridement 2. Iodoflex under Kerlix/Coban 3. Follow-up in 1 week 4. PCR culture[facility] antibiotics Electronic Signature(s) Signed: 11/21/2021 9:53:58 AM By: Kalman Shan DO Entered By: Kalman Shan on 11/21/2021 09:53:16 -------------------------------------------------------------------------------- HxROS Details Patient Name: Date of Service: Corey [name], [name] 11/21/2021 8:15 A M Medical Record Number: [id number] Patient Account Number: [id number] Date of Birth/Sex: Treating RN: [birth year] (76 y.o. [name] Primary Care Provider: Billey Gosling Other Clinician: Referring Provider: Treating Provider/Extender: [name] in Treatment:  2 Constitutional Symptoms (General Health) Medical History: Past Medical History Notes: CVA 2014 bilateral hearing loss Eyes Medical History: Positive for: Cataracts Respiratory Medical History: Positive for: Sleep Apnea - has CPAP does not wear Cardiovascular Medical History: Positive for: Congestive Heart Failure; Hypertension; Myocardial Infarction - 2014 Gastrointestinal Medical History: Past Medical History Notes: Diverticulosis Endocrine Medical History: Positive for: Type II Diabetes Time with diabetes: >20 years Treated with: Oral agents, Diet Blood sugar tested every day: Yes Tested : BID Genitourinary Medical History: Past Medical History Notes: Renal insufficiency 20/ Musculoskeletal Medical History: Positive for: Gout Neurologic Medical History: Negative for: Seizure Disorder Oncologic Medical History: Past Medical History Notes: Prostate Ca HBO Extended History Items Eyes: Cataracts Immunizations Pneumococcal Vaccine: Received Pneumococcal Vaccination: No Implantable Devices No devices added Hospitalization / Surgery History Type of Hospitalization/Surgery 06/2021 inguinal hernia repair cardioversion 09/2020 ventral hernia repair 3/19 Family and Social History Cancer: Yes - Father; Diabetes: Yes - Mother,Father; Heart Disease: Yes - Mother,Siblings; Hypertension: Yes - Mother,Father; Former smoker - quit 1970; Marital Status - Married; Drug Use: No History; Caffeine Use: Moderate - coffee; Financial Concerns: No; Food, Clothing or Shelter Needs: No; Support System Lacking: No; Transportation Concerns: No Electronic Signature(s) Signed: 11/21/2021 9:53:58 AM By: Kalman Shan DO Signed: 11/21/2021 4:29:33 PM By: Deon Pilling RN, BSN Entered By: Kalman Shan on 11/21/2021 09:51:42 -------------------------------------------------------------------------------- [place] Details Patient Name: Date of Service: Corey [name], [name]  11/21/2021 Medical Record Number: [id number] Patient Account Number: [id number] Date of Birth/Sex: Treating RN: 25-May-1945 (76 y.o. [name] Primary Care Provider: Billey Gosling Other Clinician: Referring Provider: Treating Provider/Extender: [name] in Treatment: 2 Diagnosis Coding ICD-10 Codes Code Description [phone] 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 with other specified severity E11.622 Type 2 diabetes mellitus with other skin ulcer I50.42 Chronic combined systolic (congestive) and diastolic (congestive) heart failure I25.119 Atherosclerotic heart disease of native coronary artery with unspecified angina pectoris Facility Procedures CPT4 Code: [id number] Description: 7597 - DEBRIDE WOUND 1ST 20 SQ CM OR < ICD-10 Diagnosis Description L97.918 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 with other speci Modifier: fied severity Quantity: 1 CPT4 Code: [id number] Description: 7598 - DEBRIDE WOUND EA ADDL 20 SQ CM ICD-10 Diagnosis Description L97.918 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 with other speci Modifier: fied severity Quantity: 2 Physician Procedures : CPT4 Code Description Modifier [id number] - WC PHYS DEBR WO ANESTH 20 SQ CM ICD-10 Diagnosis Description L97.918 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 with other specified severity Quantity: 1 : [id number] - WC PHYS DEBR WO ANESTH EA ADD 20 CM ICD-10 Diagnosis Description 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

## 2021-11-22 ENCOUNTER — Other Ambulatory Visit: Payer: Self-pay | Admitting: Internal Medicine

## 2021-11-24 ENCOUNTER — Encounter (HOSPITAL_BASED_OUTPATIENT_CLINIC_OR_DEPARTMENT_OTHER): Payer: Medicare HMO | Admitting: Internal Medicine

## 2021-11-24 DIAGNOSIS — E11622 Type 2 diabetes mellitus with other skin ulcer: Secondary | ICD-10-CM | POA: Diagnosis not present

## 2021-11-24 DIAGNOSIS — E1122 Type 2 diabetes mellitus with diabetic chronic kidney disease: Secondary | ICD-10-CM | POA: Diagnosis not present

## 2021-11-24 DIAGNOSIS — L97918 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 with other specified severity: Secondary | ICD-10-CM | POA: Diagnosis not present

## 2021-11-24 DIAGNOSIS — I130 Hypertensive heart and chronic kidney disease with heart failure and stage 1 through stage 4 chronic kidney disease, or unspecified chronic kidney disease: Secondary | ICD-10-CM | POA: Diagnosis not present

## 2021-11-24 DIAGNOSIS — I5042 Chronic combined systolic (congestive) and diastolic (congestive) heart failure: Secondary | ICD-10-CM | POA: Diagnosis not present

## 2021-11-24 DIAGNOSIS — I25119 Atherosclerotic heart disease of native coronary artery with unspecified angina pectoris: Secondary | ICD-10-CM | POA: Diagnosis not present

## 2021-11-24 DIAGNOSIS — N184 Chronic kidney disease, stage 4 (severe): Secondary | ICD-10-CM | POA: Diagnosis not present

## 2021-11-24 NOTE — Progress Notes (Signed)
SEVON, ROTERT ([id number]) Visit Report for 11/24/2021 Arrival Information Details Patient Name: Date of Service: Vivi Martens. 11/24/2021 12:30 PM Medical Record Number: [id number] Patient Account Number: [id number] Date of Birth/Sex: Treating RN: April 04, 1946 (76 y.o. M) Primary Care Liddy Deam: Billey Gosling Other Clinician: Referring Finlay Mills: Treating Murel Shenberger/Extender: Mickle Asper in Treatment: 3 Visit Information History Since Last Visit Added or deleted any medications: No Patient Arrived: Ambulatory Any new allergies or adverse reactions: No Arrival Time: 12:57 Had a fall or experienced change in No Accompanied By: wife activities of daily living that may affect Transfer Assistance: None risk of falls: Patient Identification Verified: Yes Signs or symptoms of abuse/neglect since last visito No Secondary Verification Process Completed: Yes Hospitalized since last visit: No Patient Requires Transmission-Based No Implantable device outside of the clinic excluding No Precautions: cellular tissue based products placed in the center Patient Has Alerts: Yes since last visit: Patient Alerts: in clinic R [place] Has Dressing in Place as Prescribed: Yes 11/17/2021 ABIs: both [place] Has Compression in Place as Prescribed: Yes 11/17/2021 TBI: R0.64 Pain Present Now: No L0.58 Electronic Signature(s) Signed: 11/24/2021 4:37:01 PM By: Erenest Blank Entered By: Erenest Blank on 11/24/2021 12:58:50 -------------------------------------------------------------------------------- Clinic Level of Care Assessment Details Patient Name: Date of Service: Vivi Martens. 11/24/2021 12:30 PM Medical Record Number: [id number] Patient Account Number: [id number] Date of Birth/Sex: Treating RN: [birth year] (76 y.o. M) Primary Care Gregroy Dombkowski: Billey Gosling Other Clinician: Referring Perle Gibbon: Treating Tevis Dunavan/Extender: Mickle Asper in Treatment: 3 Clinic  Level of Care Assessment Items TOOL 4 Quantity Score X- 1 0 Use when only an EandM is performed on FOLLOW-UP visit ASSESSMENTS - Nursing Assessment / Reassessment X- 1 10 Reassessment of Co-morbidities (includes updates in patient status) X- 1 5 Reassessment of Adherence to Treatment Plan ASSESSMENTS - Wound and Skin A ssessment / Reassessment X - Simple Wound Assessment / Reassessment - one wound 1 5 '[]'$  - 0 Complex Wound Assessment / Reassessment - multiple wounds '[]'$  - 0 Dermatologic / Skin Assessment (not related to wound area) ASSESSMENTS - Focused Assessment '[]'$  - 0 Circumferential Edema Measurements - multi extremities '[]'$  - 0 Nutritional Assessment / Counseling / Intervention '[]'$  - 0 Lower Extremity Assessment (monofilament, tuning fork, pulses) '[]'$  - 0 Peripheral Arterial Disease Assessment (using hand held doppler) ASSESSMENTS - Ostomy and/or Continence Assessment and Care '[]'$  - 0 Incontinence Assessment and Management '[]'$  - 0 Ostomy Care Assessment and Management (repouching, etc.) PROCESS - Coordination of Care X - Simple Patient / Family Education for ongoing care 1 15 '[]'$  - 0 Complex (extensive) Patient / Family Education for ongoing care X- 1 10 Staff obtains Programmer, systems, Records, T Results / Process Orders est '[]'$  - 0 Staff telephones HHA, Nursing Homes / Clarify orders / etc '[]'$  - 0 Routine Transfer to another Facility (non-emergent condition) '[]'$  - 0 Routine Hospital Admission (non-emergent condition) '[]'$  - 0 New Admissions / Biomedical engineer / Ordering NPWT Apligraf, etc. , '[]'$  - 0 Emergency Hospital Admission (emergent condition) X- 1 10 Simple Discharge Coordination '[]'$  - 0 Complex (extensive) Discharge Coordination PROCESS - Special Needs '[]'$  - 0 Pediatric / Minor Patient Management '[]'$  - 0 Isolation Patient Management '[]'$  - 0 Hearing / Language / Visual special needs '[]'$  - 0 Assessment of Community assistance (transportation, D/C planning, etc.) '[]'$   - 0 Additional assistance / Altered mentation '[]'$  - 0 Support Surface(s) Assessment (bed, cushion, seat, etc.) INTERVENTIONS - Wound Cleansing / Measurement  X - Simple Wound Cleansing - one wound 1 5 '[]'$  - 0 Complex Wound Cleansing - multiple wounds '[]'$  - 0 Wound Imaging (photographs - any number of wounds) '[]'$  - 0 Wound Tracing (instead of photographs) '[]'$  - 0 Simple Wound Measurement - one wound '[]'$  - 0 Complex Wound Measurement - multiple wounds INTERVENTIONS - Wound Dressings '[]'$  - 0 Small Wound Dressing one or multiple wounds '[]'$  - 0 Medium Wound Dressing one or multiple wounds X- 1 20 Large Wound Dressing one or multiple wounds X- 1 5 Application of Medications - topical '[]'$  - 0 Application of Medications - injection INTERVENTIONS - Miscellaneous '[]'$  - 0 External ear exam '[]'$  - 0 Specimen Collection (cultures, biopsies, blood, body fluids, etc.) '[]'$  - 0 Specimen(s) / Culture(s) sent or taken to Lab for analysis '[]'$  - 0 Patient Transfer (multiple staff / Civil Service fast streamer / Similar devices) '[]'$  - 0 Simple Staple / Suture removal (25 or less) '[]'$  - 0 Complex Staple / Suture removal (26 or more) '[]'$  - 0 Hypo / Hyperglycemic Management (close monitor of Blood Glucose) '[]'$  - 0 Ankle / Brachial Index (ABI) - do not check if billed separately X- 1 5 Vital Signs Has the patient been seen at the hospital within the last three years: Yes Total Score: 90 Level Of Care: New/Established - Level 3 Electronic Signature(s) Signed: 11/24/2021 4:37:01 PM By: Erenest Blank Entered By: Erenest Blank on 11/24/2021 16:36:15 -------------------------------------------------------------------------------- Encounter Discharge Information Details Patient Name: Date of Service: [name], [name] 11/24/2021 12:30 PM Medical Record Number: [id number] Patient Account Number: [id number] Date of Birth/Sex: Treating RN: [birth year] (76 y.o. M) Primary Care Rawn Quiroa: Billey Gosling Other Clinician: Referring  Nour Rodrigues: Treating Zhuri Krass/Extender: Mickle Asper in Treatment: 3 Encounter Discharge Information Items Discharge Condition: Stable Ambulatory Status: Ambulatory Discharge Destination: Home Transportation: Private Auto Accompanied By: wife Schedule Follow-up Appointment: Yes Clinical Summary of Care: Electronic Signature(s) Signed: 11/24/2021 4:37:01 PM By: Erenest Blank Entered By: Erenest Blank on 11/24/2021 15:53:17 -------------------------------------------------------------------------------- Patient/Caregiver Education Details Patient Name: Date of Service: KO [name] 7/13/2023andnbsp12:30 PM Medical Record Number: [id number] Patient Account Number: [id number] Date of Birth/Gender: Treating RN: Oct 15, 1945 (76 y.o. M) Primary Care Physician: Billey Gosling Other Clinician: Referring Physician: Treating Physician/Extender: Mickle Asper in Treatment: 3 Education Assessment Education Provided To: Patient Education Topics Provided Electronic Signature(s) Signed: 11/24/2021 4:37:01 PM By: Erenest Blank Entered By: Erenest Blank on 11/24/2021 15:52:56 -------------------------------------------------------------------------------- Wound Assessment Details Patient Name: Date of Service: [name], Tor Netters. 11/24/2021 12:30 PM Medical Record Number: [id number] Patient Account Number: [id number] Date of Birth/Sex: Treating RN: 19-Jun-1945 (76 y.o. M) Primary Care Shamar Engelmann: Billey Gosling Other Clinician: Referring Baylynn Shifflett: Treating Zubayr Bednarczyk/Extender: [name] in Treatment: 3 Wound Status Wound Number: 1 Primary Etiology: Diabetic Wound/Ulcer of the Lower Extremity Wound Location: Right, Lateral Lower Leg Wound Status: Open Wounding Event: Gradually Appeared Date Acquired: 08/15/2021 Weeks Of Treatment: 3 Clustered Wound: No Wound Measurements Length: (cm) 10 Width: (cm) 9 Depth: (cm)  0.1 Area: (cm) 70.686 Volume: (cm) 7.069 % Reduction in Area: -227.3% % Reduction in Volume: -227.3% Wound Description Classification: Grade 2 Exudate Amount: Large Exudate Type: Purulent Exudate Color: yellow, brown, green Treatment Notes Wound #1 (Lower Leg) Wound Laterality: Right, Lateral Cleanser Normal Saline Discharge Instruction: Cleanse the wound with Normal Saline prior to applying a clean dressing using gauze sponges, not tissue or cotton balls. Soap and Water Discharge Instruction: May shower and  wash wound with dial antibacterial soap and water prior to dressing change. Wound Cleanser Discharge Instruction: Cleanse the wound with wound cleanser prior to applying a clean dressing using gauze sponges, not tissue or cotton balls. Peri-Wound Care Topical Primary Dressing IODOFLEX 0.9% Cadexomer Iodine Pad 4x6 cm Discharge Instruction: Apply to wound bed as instructed Secondary Dressing ABD Pad, 8x10 Discharge Instruction: Apply over primary dressing as directed. CarboFLEX Odor Control Dressing, 4x4 in Discharge Instruction: Apply over primary dressing as directed. Zetuvit Plus 4x8 in Discharge Instruction: Apply over primary dressing as directed. Secured With Compression Wrap Kerlix Roll 4.5x3.1 (in/yd) Discharge Instruction: Apply Kerlix and Coban compression as directed. Coban Self-Adherent Wrap 4x5 (in/yd) Discharge Instruction: Apply over Kerlix as directed. Compression Stockings Add-Ons Electronic Signature(s) Signed: 11/24/2021 4:37:01 PM By: Erenest Blank Entered By: Erenest Blank on 11/24/2021 13:12:34 -------------------------------------------------------------------------------- Vitals Details Patient Name: Date of Service: [name], [name] 11/24/2021 12:30 PM Medical Record Number: [id number] Patient Account Number: [id number] Date of Birth/Sex: Treating RN: [birth year] (76 y.o. M) Primary Care Bettyanne Dittman: Billey Gosling Other  Clinician: Referring Abel Ra: Treating Eimy Plaza/Extender: Mickle Asper in Treatment: 3 Vital Signs Time Taken: 12:59 Temperature (F): 98.7 Height (in): 66 Pulse (bpm): 53 Weight (lbs): 145 Respiratory Rate (breaths/min): 18 Body Mass Index (BMI): 23.4 Blood Pressure (mmHg): 108/69 Reference Range: 80 - 120 mg / dl Electronic Signature(s) Signed: 11/24/2021 4:37:01 PM By: Erenest Blank Entered By: Erenest Blank on 11/24/2021 13:01:22

## 2021-11-25 NOTE — Progress Notes (Signed)
RAYMAR, JOINER ([id number]) Visit Report for 11/24/2021 SuperBill Details Patient Name: Date of Service: Corey [name] 11/24/2021 Medical Record Number: [id number] Patient Account Number: [id number] Date of Birth/Sex: Treating RN: [birth year] (76 y.o. 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

## 2021-11-28 ENCOUNTER — Other Ambulatory Visit: Payer: Self-pay

## 2021-11-28 ENCOUNTER — Encounter (HOSPITAL_BASED_OUTPATIENT_CLINIC_OR_DEPARTMENT_OTHER): Payer: Medicare HMO | Admitting: Internal Medicine

## 2021-11-28 DIAGNOSIS — I5042 Chronic combined systolic (congestive) and diastolic (congestive) heart failure: Secondary | ICD-10-CM

## 2021-11-28 DIAGNOSIS — I25119 Atherosclerotic heart disease of native coronary artery with unspecified angina pectoris: Secondary | ICD-10-CM

## 2021-11-28 DIAGNOSIS — E11622 Type 2 diabetes mellitus with other skin ulcer: Secondary | ICD-10-CM

## 2021-11-28 DIAGNOSIS — E1122 Type 2 diabetes mellitus with diabetic chronic kidney disease: Secondary | ICD-10-CM | POA: Diagnosis not present

## 2021-11-28 DIAGNOSIS — L97918 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 with other specified severity: Secondary | ICD-10-CM | POA: Diagnosis not present

## 2021-11-28 DIAGNOSIS — N184 Chronic kidney disease, stage 4 (severe): Secondary | ICD-10-CM | POA: Diagnosis not present

## 2021-11-28 DIAGNOSIS — I130 Hypertensive heart and chronic kidney disease with heart failure and stage 1 through stage 4 chronic kidney disease, or unspecified chronic kidney disease: Secondary | ICD-10-CM | POA: Diagnosis not present

## 2021-11-28 MED ORDER — HYDRALAZINE HCL 50 MG PO TABS: 100.0000 mg | ORAL_TABLET | Freq: Three times a day (TID) | ORAL | 11 refills | Status: DC

## 2021-11-28 MED ORDER — ROSUVASTATIN CALCIUM 40 MG PO TABS: 40.0000 mg | ORAL_TABLET | Freq: Every day | ORAL | 3 refills | Status: DC

## 2021-11-28 NOTE — Progress Notes (Signed)
MARTRELL, EGUIA ([id number]) Visit Report for 11/28/2021 Arrival Information Details Patient Name: Date of Service: Corey [name]. 11/28/2021 12:30 PM Medical Record Number: [id number] Patient Account Number: [id number] Date of Birth/Sex: Treating RN: May 28, 1945 (76 y.o. [name] Primary Care Lizet Kelso: Billey Gosling Other Clinician: Referring Robbi Spells: Treating Maahir Horst/Extender: Mickle Asper in Treatment: 3 Visit Information History Since Last Visit Added or deleted any medications: No Patient Arrived: Ambulatory Any new allergies or adverse reactions: No Arrival Time: 12:35 Had a fall or experienced change in No Accompanied By: wife activities of daily living that may affect Transfer Assistance: None risk of falls: Patient Identification Verified: Yes Signs or symptoms of abuse/neglect since last visito No Secondary Verification Process Completed: Yes Hospitalized since last visit: No Patient Requires Transmission-Based No Implantable device outside of the clinic excluding No Precautions: cellular tissue based products placed in the center Patient Has Alerts: Yes since last visit: Patient Alerts: in clinic R Bone Gap Has Dressing in Place as Prescribed: Yes 11/17/2021 ABIs: both  Has Compression in Place as Prescribed: Yes 11/17/2021 TBI: R0.64 L0.58 Pain Present Now: No Electronic Signature(s) Signed: 11/28/2021 3:51:06 PM By: Deon Pilling RN, BSN Entered By: Deon Pilling on 11/28/2021 12:44:09 -------------------------------------------------------------------------------- Encounter Discharge Information Details Patient Name: Date of Service: Corey [name], [name] 11/28/2021 12:30 PM Medical Record Number: [id number] Patient Account Number: [id number] Date of Birth/Sex: Treating RN: 1945-06-25 (76 y.o. [name] Primary Care Tynia Wiers: Billey Gosling Other Clinician: Referring Leveda Kendrix: Treating Mirtha Jain/Extender: Mickle Asper in Treatment: 3 Encounter Discharge Information Items Post Procedure Vitals Discharge Condition: Stable Temperature (F): 98.1 Ambulatory Status: Ambulatory Pulse (bpm): 45 Discharge Destination: Home Respiratory Rate (breaths/min): 20 Transportation: Private Auto Blood Pressure (mmHg): 139/78 Accompanied By: wife Schedule Follow-up Appointment: Yes Clinical Summary of Care: Electronic Signature(s) Signed: 11/28/2021 3:51:06 PM By: Deon Pilling RN, BSN Entered By: Deon Pilling on 11/28/2021 13:08:38 -------------------------------------------------------------------------------- Lower Extremity Assessment Details Patient Name: Date of Service: Corey [name], [name] 11/28/2021 12:30 PM Medical Record Number: [id number] Patient Account Number: [id number] Date of Birth/Sex: Treating RN: July 28, 1945 (76 y.o. [name] Primary Care Marieann Zipp: Billey Gosling Other Clinician: Referring Tilman Mcclaren: Treating Ainslee Sou/Extender: [name] in Treatment: 3 Edema Assessment Assessed: [Left: No] [Right: Yes] Edema: [Left: N] [Right: o] Calf Left: Right: Point of Measurement: 33 cm From Medial Instep 33 cm Ankle Left: Right: Point of Measurement: 11 cm From Medial Instep 20 cm Vascular Assessment Pulses: Dorsalis Pedis Palpable: [Right:Yes] Electronic Signature(s) Signed: 11/28/2021 3:51:06 PM By: Deon Pilling RN, BSN Entered By: Deon Pilling on 11/28/2021 12:44:42 -------------------------------------------------------------------------------- Multi Wound Chart Details Patient Name: Date of Service: Corey [name], [name] 11/28/2021 12:30 PM Medical Record Number: [id number] Patient Account Number: [id number] Date of Birth/Sex: Treating RN: [birth year] (76 y.o. [name] Primary Care Cristi Gwynn: Billey Gosling Other Clinician: Referring Jemel Ono: Treating Sir Mallis/Extender: [name] in Treatment: 3 Vital  Signs Height(in): 66 Pulse(bpm): 45 Weight(lbs): 145 Blood Pressure(mmHg): 139/78 Body Mass Index(BMI): 23.4 Temperature(F): 98.1 Respiratory Rate(breaths/min): 20 Photos: [N/A:N/A] Right, Lateral Lower Leg N/A N/A Wound Location: Gradually Appeared N/A N/A Wounding Event: Diabetic Wound/Ulcer of the Lower N/A N/A Primary Etiology: Extremity Cataracts, Sleep Apnea, Congestive N/A N/A Comorbid History: Heart Failure, Hypertension, Myocardial Infarction, Type II Diabetes, Gout 08/15/2021 N/A N/A Date Acquired: 3 N/A N/A Weeks of Treatment: Open N/A N/A Wound Status: No N/A N/A Wound Recurrence: 8.9x9x0.1 N/A N/A Measurements L  x W x D (cm) 62.91 N/A N/A A (cm) : rea 6.291 N/A N/A Volume (cm) : -191.30% N/A N/A % Reduction in A rea: -191.20% N/A N/A % Reduction in Volume: Grade 2 N/A N/A Classification: None Present N/A N/A Exudate A mount: Distinct, outline attached N/A N/A Wound Margin: None Present (0%) N/A N/A Granulation A mount: Large (67-100%) N/A N/A Necrotic A mount: Eschar, Adherent Slough N/A N/A Necrotic Tissue: Fat Layer (Subcutaneous Tissue): Yes N/A N/A Exposed Structures: Fascia: No Tendon: No Muscle: No Joint: No Bone: No Small (1-33%) N/A N/A Epithelialization: Debridement - Excisional N/A N/A Debridement: Pre-procedure Verification/Time Out 12:50 N/A N/A Taken: Lidocaine 4% Topical Solution N/A N/A Pain Control: Necrotic/Eschar, Subcutaneous, N/A N/A Tissue Debrided: Slough Skin/Subcutaneous Tissue N/A N/A Level: 80.1 N/A N/A Debridement A (sq cm): rea Curette N/A N/A Instrument: Minimum N/A N/A Bleeding: Pressure N/A N/A Hemostasis Achieved: 0 N/A N/A Procedural Pain: 0 N/A N/A Post Procedural Pain: Debridement Treatment Response: Procedure was tolerated well N/A N/A Post Debridement Measurements L x 8.9x9x0.1 N/A N/A W x D (cm) 6.291 N/A N/A Post Debridement Volume: (cm) Debridement N/A N/A Procedures  Performed: Treatment Notes Wound #1 (Lower Leg) Wound Laterality: Right, Lateral Cleanser Normal Saline Discharge Instruction: Cleanse the wound with Normal Saline prior to applying a clean dressing using gauze sponges, not tissue or cotton balls. Soap and Water Discharge Instruction: May shower and wash wound with dial antibacterial soap and water prior to dressing change. Wound Cleanser Discharge Instruction: Cleanse the wound with wound cleanser prior to applying a clean dressing using gauze sponges, not tissue or cotton balls. Peri-Wound Care Topical Primary Dressing Dakin's Solution 0.25%, 16 (oz) Discharge Instruction: Moisten gauze with Dakin's solution Secondary Dressing ABD Pad, 8x10 Discharge Instruction: Apply over primary dressing as directed. Secured With Compression Wrap Kerlix Roll 4.5x3.1 (in/yd) Discharge Instruction: Apply Kerlix and Coban compression as directed. Compression Stockings Add-Ons Electronic Signature(s) Signed: 11/28/2021 1:43:05 PM By: Kalman Shan DO Signed: 11/28/2021 3:51:06 PM By: Deon Pilling RN, BSN Entered By: Kalman Shan on 11/28/2021 13:33:39 -------------------------------------------------------------------------------- Multi-Disciplinary Care Plan Details Patient Name: Date of Service: Corey [name], [name] 11/28/2021 12:30 PM Medical Record Number: [id number] Patient Account Number: [id number] Date of Birth/Sex: Treating RN: May 30, 1945 (76 y.o. [name] Primary Care Demarea Lorey: Billey Gosling Other Clinician: Referring Lafayette Dunlevy: Treating Jakaiden Fill/Extender: Mickle Asper in Treatment: 3 Active Inactive Necrotic Tissue Nursing Diagnoses: Knowledge deficit related to management of necrotic/devitalized tissue Goals: Necrotic/devitalized tissue will be minimized in the wound bed Date Initiated: 11/01/2021 Target Resolution Date: 12/09/2021 Goal Status: Active Patient/caregiver will verbalize  understanding of reason and process for debridement of necrotic tissue Date Initiated: 11/01/2021 Target Resolution Date: 12/09/2021 Goal Status: Active Interventions: Assess patient pain level pre-, during and post procedure and prior to discharge Provide education on necrotic tissue and debridement process Treatment Activities: Apply topical anesthetic as ordered : 11/01/2021 Enzymatic debridement : 11/01/2021 Excisional debridement : 11/01/2021 Notes: Pain, Acute or Chronic Nursing Diagnoses: Pain, acute or chronic: actual or potential Potential alteration in comfort, pain Goals: Patient will verbalize adequate pain control and receive pain control interventions during procedures as needed Date Initiated: 11/01/2021 Date Inactivated: 11/21/2021 Target Resolution Date: 12/09/2021 Goal Status: Met Patient/caregiver will verbalize comfort level met Date Initiated: 11/01/2021 Target Resolution Date: 12/09/2021 Goal Status: Active Interventions: Complete pain assessment as per visit requirements Provide education on pain management Treatment Activities: Administer pain control measures as ordered : 11/01/2021 Notes: Wound/Skin Impairment Nursing Diagnoses: Knowledge deficit  related to ulceration/compromised skin integrity Goals: Patient/caregiver will verbalize understanding of skin care regimen Date Initiated: 11/01/2021 Target Resolution Date: 12/09/2021 Goal Status: Active Interventions: Assess patient/caregiver ability to obtain necessary supplies Treatment Activities: Skin care regimen initiated : 11/01/2021 Topical wound management initiated : 11/01/2021 Notes: Electronic Signature(s) Signed: 11/28/2021 3:51:06 PM By: Deon Pilling RN, BSN Entered By: Deon Pilling on 11/28/2021 13:07:00 -------------------------------------------------------------------------------- Pain Assessment Details Patient Name: Date of Service: Corey [name], [name] 11/28/2021 12:30 PM Medical Record  Number: [id number] Patient Account Number: [id number] Date of Birth/Sex: Treating RN: 1945-12-26 (77 y.o. [name] Primary Care Kainan Patty: Billey Gosling Other Clinician: Referring Kristyna Bradstreet: Treating Aldene Hendon/Extender: [name] in Treatment: 3 Active Problems Location of Pain Severity and Description of Pain Patient Has Paino No Site Locations Rate the pain. Current Pain Level: 0 Pain Management and Medication Current Pain Management: Medication: No Cold Application: No Rest: No Massage: No Activity: No T.E.N.S.: No Heat Application: No Leg drop or elevation: No Is the Current Pain Management Adequate: Adequate How does your wound impact your activities of daily livingo Sleep: No Bathing: No Appetite: No Relationship With Others: No Bladder Continence: No Emotions: No Bowel Continence: No Work: No Toileting: No Drive: No Dressing: No Hobbies: No Engineer, maintenance) Signed: 11/28/2021 3:51:06 PM By: Deon Pilling RN, BSN Entered By: Deon Pilling on 11/28/2021 12:44:32 -------------------------------------------------------------------------------- Patient/Caregiver Education Details Patient Name: Date of Service: Corey [name] 7/17/2023andnbsp12:30 PM Medical Record Number: [id number] Patient Account Number: [id number] Date of Birth/Gender: Treating RN: 1945/10/06 (76 y.o. [name] Primary Care Physician: Billey Gosling Other Clinician: Referring Physician: Treating Physician/Extender: Mickle Asper in Treatment: 3 Education Assessment Education Provided To: Patient Education Topics Provided Wound Debridement: Handouts: Wound Debridement Methods: Explain/Verbal Responses: State content correctly Electronic Signature(s) Signed: 11/28/2021 3:51:06 PM By: Deon Pilling RN, BSN Entered By: Deon Pilling on 11/28/2021  13:07:10 -------------------------------------------------------------------------------- Wound Assessment Details Patient Name: Date of Service: Corey [name], [name] 11/28/2021 12:30 PM Medical Record Number: [id number] Patient Account Number: [id number] Date of Birth/Sex: Treating RN: [birth year] (76 y.o. [name] Primary Care Damiyah Ditmars: Billey Gosling Other Clinician: Referring Sol Odor: Treating Leomia Blake/Extender: [name] in Treatment: 3 Wound Status Wound Number: 1 Primary Diabetic Wound/Ulcer of the Lower Extremity Etiology: Wound Location: Right, Lateral Lower Leg Wound Open Wounding Event: Gradually Appeared Status: Date Acquired: 08/15/2021 Comorbid Cataracts, Sleep Apnea, Congestive Heart Failure, Hypertension, Weeks Of Treatment: 3 History: Myocardial Infarction, Type II Diabetes, Gout Clustered Wound: No Photos Wound Measurements Length: (cm) 8.9 Width: (cm) 9 Depth: (cm) 0.1 Area: (cm) 62.91 Volume: (cm) 6.291 % Reduction in Area: -191.3% % Reduction in Volume: -191.2% Epithelialization: Small (1-33%) Tunneling: No Undermining: No Wound Description Classification: Grade 2 Wound Margin: Distinct, outline attached Exudate Amount: None Present Foul Odor After Cleansing: No Slough/Fibrino Yes Wound Bed Granulation Amount: None Present (0%) Exposed Structure Necrotic Amount: Large (67-100%) Fascia Exposed: No Necrotic Quality: Eschar, Adherent Slough Fat Layer (Subcutaneous Tissue) Exposed: Yes Tendon Exposed: No Muscle Exposed: No Joint Exposed: No Bone Exposed: No Treatment Notes Wound #1 (Lower Leg) Wound Laterality: Right, Lateral Cleanser Normal Saline Discharge Instruction: Cleanse the wound with Normal Saline prior to applying a clean dressing using gauze sponges, not tissue or cotton balls. Soap and Water Discharge Instruction: May shower and wash wound with dial antibacterial soap and water prior to dressing  change. Wound Cleanser Discharge Instruction: Cleanse the wound with wound cleanser prior to  applying a clean dressing using gauze sponges, not tissue or cotton balls. Peri-Wound Care Topical Primary Dressing Dakin's Solution 0.25%, 16 (oz) Discharge Instruction: Moisten gauze with Dakin's solution Secondary Dressing ABD Pad, 8x10 Discharge Instruction: Apply over primary dressing as directed. Secured With Compression Wrap Kerlix Roll 4.5x3.1 (in/yd) Discharge Instruction: Apply Kerlix and Coban compression as directed. Compression Stockings Add-Ons Electronic Signature(s) Signed: 11/28/2021 3:51:06 PM By: Deon Pilling RN, BSN Entered By: Deon Pilling on 11/28/2021 12:45:56 -------------------------------------------------------------------------------- Vitals Details Patient Name: Date of Service: Corey [name], [name] 11/28/2021 12:30 PM Medical Record Number: [id number] Patient Account Number: [id number] Date of Birth/Sex: Treating RN: 1945-10-29 (76 y.o. [name] Primary Care Tija Biss: Billey Gosling Other Clinician: Referring Constant [place]: Treating Margarete Horace/Extender: [name] in Treatment: 3 Vital Signs Time Taken: 12:40 Temperature (F): 98.1 Height (in): 66 Pulse (bpm): 45 Weight (lbs): 145 Respiratory Rate (breaths/min): 20 Body Mass Index (BMI): 23.4 Blood Pressure (mmHg): 139/78 Reference Range: 80 - 120 mg / dl Electronic Signature(s) Signed: 11/28/2021 3:51:06 PM By: Deon Pilling RN, BSN Entered By: Deon Pilling on 11/28/2021 12:44:21

## 2021-11-28 NOTE — Addendum Note (Signed)
Addended by: Carter Kitten D on: 11/28/2021 07:27 AM   Modules accepted: Orders

## 2021-11-28 NOTE — Progress Notes (Signed)
Corey [name] ([id number]) Visit Report for 11/28/2021 Chief Complaint Document Details Patient Name: Date of Service: Corey [name]. 11/28/2021 12:30 PM Medical Record Number: [id number] Patient Account Number: [id number] Date of Birth/Sex: Treating RN: [birth year] (76 y.o. Corey [name] Primary Care Provider: Billey Gosling Other Clinician: Referring Provider: Treating Provider/Extender: [name] in Treatment: 3 Information Obtained from: Patient Chief Complaint 11/01/2021; right lower extremity wound Electronic Signature(s) Signed: 11/28/2021 1:43:05 PM By: Kalman Shan DO Entered By: Kalman Shan on 11/28/2021 13:33:44 -------------------------------------------------------------------------------- Debridement Details Patient Name: Date of Service: Corey [name], [name] 11/28/2021 12:30 PM Medical Record Number: [id number] Patient Account Number: [id number] Date of Birth/Sex: Treating RN: Nov 22, 1945 (76 y.o. Corey [name] Primary Care Provider: Billey Gosling Other Clinician: Referring Provider: Treating Provider/Extender: [name] in Treatment: 3 Debridement Performed for Assessment: Wound #1 Right,Lateral Lower Leg Performed By: Physician Kalman Shan, DO Debridement Type: Debridement Severity of Tissue Pre Debridement: Fat layer exposed Level of Consciousness (Pre-procedure): Awake and Alert Pre-procedure Verification/Time Out Yes - 12:50 Taken: Start Time: 12:51 Pain Control: Lidocaine 4% T opical Solution T Area Debrided (L x W): otal 8.9 (cm) x 9 (cm) = 80.1 (cm) Tissue and other material debrided: Non-Viable, Eschar, Slough, Subcutaneous, Slough Level: Skin/Subcutaneous Tissue Debridement Description: Excisional Instrument: Curette Bleeding: Minimum Hemostasis Achieved: Pressure End Time: 12:58 Procedural Pain: 0 Post Procedural Pain: 0 Response to Treatment: Procedure was tolerated well Level of  Consciousness (Post- Awake and Alert procedure): Post Debridement Measurements of Total Wound Length: (cm) 8.9 Width: (cm) 9 Depth: (cm) 0.1 Volume: (cm) 6.291 Character of Wound/Ulcer Post Debridement: Improved Severity of Tissue Post Debridement: Fat layer exposed Post Procedure Diagnosis Same as Pre-procedure Notes Procedure documented by nurse. Electronic Signature(s) Signed: 11/28/2021 1:43:05 PM By: Kalman Shan DO Signed: 11/28/2021 3:51:06 PM By: Deon Pilling RN, BSN Entered By: Deon Pilling on 11/28/2021 13:24:16 -------------------------------------------------------------------------------- HPI Details Patient Name: Date of Service: Corey [name], [name] 11/28/2021 12:30 PM Medical Record Number: [id number] Patient Account Number: [id number] Date of Birth/Sex: Treating RN: Sep 18, 1945 (76 y.o. Corey [name] Primary Care Provider: Billey Gosling Other Clinician: Referring Provider: Treating Provider/Extender: [name] in Treatment: 3 History of Present Illness HPI Description: Admission 11/01/2021 Corey [name] Is a 76 year old male with a past medical history of of type 2 diabetes, coronary artery disease, stage IV kidney disease acute on chronic combined systolic and diastolic heart failure that presents to the clinic for a 51-monthhistory of right lower extremity wound. He states he is not sure how this started. It is completely eschared. He reports his wound has been stable for the past month. He has been keeping the area covered. He has mild chronic pain. He denies signs of infection. He states he has a history of lower extremity ulcers last being on the left leg. He could not tell me how this was treated in the past. 6/27; patient presents for follow-up. He is scheduled for his lower extremity arterial studies on July 6th. He has been using Medihoney to the wound bed. He has no issues or complaints today. He denies signs of  infection. 7/6; patient presents for follow-up. He has home health that reported the wound having odor and increased pain last week. He was sent to urgent care. He was started on doxycycline. He is continuing Medihoney. He has his scheduled ABIs w/TBIs today. 7/10; patient presents for follow-up. He had his arterial studies  done last week. He had triphasic waveforms throughout the right lower extremity. His TBI was 0.64. He has good pedal pulses. He is almost done with his course of antibiotics. He has been using mupirocin ointment to the wound bed. 7/17; patient presents for follow-up. He has been using Iodosorb under compression therapy. He has no issues or complaints today. He states he is receiving [facility] antibiotic tomorrow In the mail. Electronic Signature(s) Signed: 11/28/2021 1:43:05 PM By: Kalman Shan DO Entered By: Kalman Shan on 11/28/2021 13:34:13 -------------------------------------------------------------------------------- Physical Exam Details Patient Name: Date of Service: Corey [name], [name] 11/28/2021 12:30 PM Medical Record Number: [id number] Patient Account Number: [id number] Date of Birth/Sex: Treating RN: 1945/12/02 (75 y.o. Corey [name] Primary Care Provider: Billey Gosling Other Clinician: Referring Provider: Treating Provider/Extender: [name] in Treatment: 3 Constitutional respirations regular, non-labored and within target range for patient.. Cardiovascular 2+ dorsalis pedis/posterior tibialis pulses. Psychiatric pleasant and cooperative. Notes Right lower extremity: T the anterior aspect there is a wound with tightly adhered necrotic tissue. No signs of surrounding infection. o Electronic Signature(s) Signed: 11/28/2021 1:43:05 PM By: Kalman Shan DO Entered By: Kalman Shan on 11/28/2021 13:34:49 -------------------------------------------------------------------------------- Physician Orders Details Patient  Name: Date of Service: Corey [name], [name] 11/28/2021 12:30 PM Medical Record Number: [id number] Patient Account Number: [id number] Date of Birth/Sex: Treating RN: 1945-06-15 (76 y.o. Corey [name] Primary Care Provider: Billey Gosling Other Clinician: Referring Provider: Treating Provider/Extender: Mickle Asper in Treatment: 3 Verbal / Phone Orders: No Diagnosis Coding Follow-up Appointments ppointment in 1 week. - Dr. Heber [place] and [place], Room 6 overflow Monday 11/28/2021 1100am Return A Other: - ****[facility] topical compounding antibiotics bring to appt each time.**** Pick up oral antibiotics and Dakin's at your pharmacy. If pharmacy does not fill pick up at [facility] over the counter or purchase off [employer]. Bathing/ Shower/ Hygiene May shower with protection but do not get wound dressing(s) wet. Edema Control - Lymphedema / SCD / Other Elevate legs to the level of the heart or above for 30 minutes daily and/or when sitting, a frequency of: - 3-4 times a day throughout the day. Avoid standing for long periods of time. Home Health New wound care orders this week; continue Home Health for wound care. May utilize formulary equivalent dressing for wound treatment orders unless otherwise specified. - home health to change at the end of the week. Dressing change daily wert to dry with Dakin's. Other Home Health Orders/Instructions: - [facility] Wound Treatment Wound #1 - Lower Leg Wound Laterality: Right, Lateral Cleanser: Normal Saline 2 x Per Day/30 Days Discharge Instructions: Cleanse the wound with Normal Saline prior to applying a clean dressing using gauze sponges, not tissue or cotton balls. Cleanser: Soap and Water [facility]) 2 x Per Day/30 Days Discharge Instructions: May shower and wash wound with dial antibacterial soap and water prior to dressing change. Cleanser: Wound Cleanser (Home Health) 2 x Per Day/30 Days Discharge  Instructions: Cleanse the wound with wound cleanser prior to applying a clean dressing using gauze sponges, not tissue or cotton balls. Prim Dressing: Dakin's Solution 0.25%, 16 (oz) 2 x Per Day/30 Days ary Discharge Instructions: Moisten gauze with Dakin's solution Secondary Dressing: ABD Pad, 8x10 (Home Health) 2 x Per Day/30 Days Discharge Instructions: Apply over primary dressing as directed. Compression Wrap: Kerlix Roll 4.5x3.1 (in/yd) (Home Health) 2 x Per Day/30 Days Discharge Instructions: Apply Kerlix and Coban compression as directed.  Patient Medications llergies: hydrochlorothiazide A Notifications Medication Indication Start End 11/28/2021 Dakin's Solution DOSE 1 - miscellaneous 0.125 % solution - moisten gauze for wet to dry dressings 11/28/2021 doxycycline hyclate DOSE 1 - oral 100 mg tablet - 1 tablet oral BID x 7 days Electronic Signature(s) Signed: 11/28/2021 1:43:05 PM By: Kalman Shan DO Previous Signature: 11/28/2021 1:12:33 PM Version By: Kalman Shan DO Entered By: Kalman Shan on 11/28/2021 13:34:57 -------------------------------------------------------------------------------- Problem List Details Patient Name: Date of Service: Corey [name], [name] 11/28/2021 12:30 PM Medical Record Number: [id number] Patient Account Number: [id number] Date of Birth/Sex: Treating RN: 01-May-1946 (76 y.o. Corey [name] Primary Care Provider: Billey Gosling Other Clinician: Referring Provider: Treating Provider/Extender: Mickle Asper in Treatment: 3 Active Problems ICD-10 Encounter Code Description Active Date MDM Diagnosis L97.918 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 with other 11/01/2021 No Yes specified severity E11.622 Type 2 diabetes mellitus with other skin ulcer 11/01/2021 No Yes I50.42 Chronic combined systolic (congestive) and diastolic (congestive) heart failure 11/01/2021 No Yes I25.119 Atherosclerotic  heart disease of native coronary artery with unspecified angina 11/01/2021 No Yes pectoris Inactive Problems Resolved Problems Electronic Signature(s) Signed: 11/28/2021 1:43:05 PM By: Kalman Shan DO Entered By: Kalman Shan on 11/28/2021 13:33:35 -------------------------------------------------------------------------------- Progress Note Details Patient Name: Date of Service: Corey [name], [name] 11/28/2021 12:30 PM Medical Record Number: [id number] Patient Account Number: [id number] Date of Birth/Sex: Treating RN: 1946-02-25 (76 y.o. Corey [name] Primary Care Provider: Billey Gosling Other Clinician: Referring Provider: Treating Provider/Extender: Mickle Asper in Treatment: 3 Subjective Chief Complaint Information obtained from Patient 11/01/2021; right lower extremity wound History of Present Illness (HPI) Admission 11/01/2021 Mr. Tauheed Mcfayden Is a 76 year old male with a past medical history of of type 2 diabetes, coronary artery disease, stage IV kidney disease acute on chronic combined systolic and diastolic heart failure that presents to the clinic for a 55-monthhistory of right lower extremity wound. He states he is not sure how this started. It is completely eschared. He reports his wound has been stable for the past month. He has been keeping the area covered. He has mild chronic pain. He denies signs of infection. He states he has a history of lower extremity ulcers last being on the left leg. He could not tell me how this was treated in the past. 6/27; patient presents for follow-up. He is scheduled for his lower extremity arterial studies on July 6th. He has been using Medihoney to the wound bed. He has no issues or complaints today. He denies signs of infection. 7/6; patient presents for follow-up. He has home health that reported the wound having odor and increased pain last week. He was sent to urgent care. He was started on doxycycline. He  is continuing Medihoney. He has his scheduled ABIs w/TBIs today. 7/10; patient presents for follow-up. He had his arterial studies done last week. He had triphasic waveforms throughout the right lower extremity. His TBI was 0.64. He has good pedal pulses. He is almost done with his course of antibiotics. He has been using mupirocin ointment to the wound bed. 7/17; patient presents for follow-up. He has been using Iodosorb under compression therapy. He has no issues or complaints today. He states he is receiving [facility] antibiotic tomorrow In the mail. Patient History Family History Cancer - Father, Diabetes - Mother,Father, Heart Disease - Mother,Siblings, Hypertension - Mother,Father. Social History Former smoker - quit 1970, Marital Status - Married,  Drug Use - No History, Caffeine Use - Moderate - coffee. Medical History Eyes Patient has history of Cataracts Respiratory Patient has history of Sleep Apnea - has CPAP does not wear Cardiovascular Patient has history of Congestive Heart Failure, Hypertension, Myocardial Infarction - 2014 Endocrine Patient has history of Type II Diabetes Musculoskeletal Patient has history of Gout Neurologic Denies history of Seizure Disorder Hospitalization/Surgery History - 06/2021 inguinal hernia repair. - cardioversion 09/2020. - ventral hernia repair 3/19. 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 Vitals Time Taken: 12:40 PM, Height: 66 in, Weight: 145 lbs, BMI: 23.4, Temperature: 98.1 F, Pulse: 45 bpm, Respiratory Rate: 20 breaths/min, Blood Pressure: 139/78 mmHg. Cardiovascular 2+ dorsalis pedis/posterior tibialis pulses. Psychiatric pleasant and cooperative. General Notes: Right  lower extremity: T the anterior aspect there is a wound with tightly adhered necrotic tissue. No signs of surrounding infection. o Integumentary (Hair, Skin) Wound #1 status is Open. Original cause of wound was Gradually Appeared. The date acquired was: 08/15/2021. The wound has been in treatment 3 weeks. The wound is located on the Right,Lateral Lower Leg. The wound measures 8.9cm length x 9cm width x 0.1cm depth; 62.91cm^2 area and 6.291cm^3 volume. There is Fat Layer (Subcutaneous Tissue) exposed. There is no tunneling or undermining noted. There is a none present amount of drainage noted. The wound margin is distinct with the outline attached to the wound base. There is no granulation within the wound bed. There is a large (67-100%) amount of necrotic tissue within the wound bed including Eschar and Adherent Slough. 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 Type 2 diabetes mellitus with other skin ulcer Chronic combined systolic (congestive) and diastolic (congestive) heart failure Atherosclerotic heart disease of native coronary artery with unspecified angina pectoris Patient's wound is stable. He continues to have significant amount of necrotic debris. I debrided nonviable tissue With the 5 curette. At this time I recommended daily dressing changes with Dakin's wet-to-dry dressings T see if this will help remove more nonviable tissue. I will also continue oral o antibiotics as he has significant bioburden Thick yellow drainage. He did well with doxycycline. I Will continue this. I asked him to bring [facility] antibiotic to next clinic visit and we will start it then. Procedures Wound #1 Pre-procedure diagnosis of Wound #1 is a Diabetic Wound/Ulcer of the Lower Extremity located on the Right,Lateral Lower Leg .Severity of Tissue Pre Debridement is: Fat layer exposed. There was a Excisional  Skin/Subcutaneous Tissue Debridement with a total area of 80.1 sq cm performed by Kalman Shan, DO. With the following instrument(s): Curette to remove Non-Viable tissue/material. Material removed includes Eschar, Subcutaneous Tissue, and Slough after achieving pain control using Lidocaine 4% Topical Solution. A time out was conducted at 12:50, prior to the start of the procedure. A Minimum amount of bleeding was controlled with Pressure. The procedure was tolerated well with a pain level of 0 throughout and a pain level of 0 following the procedure. Post Debridement Measurements: 8.9cm length x 9cm width x 0.1cm depth; 6.291cm^3 volume. Character of Wound/Ulcer Post Debridement is improved. Severity of Tissue Post Debridement is: Fat layer exposed. Post procedure Diagnosis Wound #1: Same as Pre-Procedure General Notes: Procedure documented by nurse.. Plan Follow-up Appointments: Return Appointment in 1 week. - Dr. Heber [place] and [place],  Room 6 overflow Monday 11/28/2021 1100am Other: - ****[facility] topical compounding antibiotics bring to appt each time.**** Pick up oral antibiotics and Dakin's at your pharmacy. If pharmacy does not fill pick up at [facility] over the counter or purchase off [employer]. Bathing/ Shower/ Hygiene: May shower with protection but do not get wound dressing(s) wet. Edema Control - Lymphedema / SCD / Other: Elevate legs to the level of the heart or above for 30 minutes daily and/or when sitting, a frequency of: - 3-4 times a day throughout the day. Avoid standing for long periods of time. Home Health: New wound care orders this week; continue Home Health for wound care. May utilize formulary equivalent dressing for wound treatment orders unless otherwise specified. - home health to change at the end of the week. Dressing change daily wert to dry with Dakin's. Other Home Health Orders/Instructions: - [facility] The following medication(s) was  prescribed: Dakin's Solution miscellaneous 0.125 % solution 1 moisten gauze for wet to dry dressings starting 11/28/2021 doxycycline hyclate oral 100 mg tablet 1 1 tablet oral BID x 7 days starting 11/28/2021 WOUND #1: - Lower Leg Wound Laterality: Right, Lateral Cleanser: Normal Saline 2 x Per Day/30 Days Discharge Instructions: Cleanse the wound with Normal Saline prior to applying a clean dressing using gauze sponges, not tissue or cotton balls. Cleanser: Soap and Water [facility]) 2 x Per Day/30 Days Discharge Instructions: May shower and wash wound with dial antibacterial soap and water prior to dressing change. Cleanser: Wound Cleanser (Home Health) 2 x Per Day/30 Days Discharge Instructions: Cleanse the wound with wound cleanser prior to applying a clean dressing using gauze sponges, not tissue or cotton balls. Prim Dressing: Dakin's Solution 0.25%, 16 (oz) 2 x Per Day/30 Days ary Discharge Instructions: Moisten gauze with Dakin's solution Secondary Dressing: ABD Pad, 8x10 (Home Health) 2 x Per Day/30 Days Discharge Instructions: Apply over primary dressing as directed. Com pression Wrap: Kerlix Roll 4.5x3.1 (in/yd) (Home Health) 2 x Per Day/30 Days Discharge Instructions: Apply Kerlix and Coban compression as directed. 1. Doxycycline 2. Dakin's wet-to-dry dressings 3. Follow-up in 1 week Electronic Signature(s) Signed: 11/28/2021 1:43:05 PM By: Kalman Shan DO Entered By: Kalman Shan on 11/28/2021 13:37:11 -------------------------------------------------------------------------------- HxROS Details Patient Name: Date of Service: Corey [name], [name] 11/28/2021 12:30 PM Medical Record Number: [id number] Patient Account Number: [id number] Date of Birth/Sex: Treating RN: Oct 05, 1945 (76 y.o. Corey [name] Primary Care Provider: Billey Gosling Other Clinician: Referring Provider: Treating Provider/Extender: [name] in Treatment: 3 Constitutional  Symptoms (General Health) Medical History: Past Medical History Notes: CVA 2014 bilateral hearing loss Eyes Medical History: Positive for: Cataracts Respiratory Medical History: Positive for: Sleep Apnea - has CPAP does not wear Cardiovascular Medical History: Positive for: Congestive Heart Failure; Hypertension; Myocardial Infarction - 2014 Gastrointestinal Medical History: Past Medical History Notes: Diverticulosis Endocrine Medical History: Positive for: Type II Diabetes Time with diabetes: >20 years Treated with: Oral agents, Diet Blood sugar tested every day: Yes Tested : BID Genitourinary Medical History: Past Medical History Notes: Renal insufficiency 20/ Musculoskeletal Medical History: Positive for: Gout Neurologic Medical History: Negative for: Seizure Disorder Oncologic Medical History: Past Medical History Notes: Prostate Ca HBO Extended History Items Eyes: Cataracts Immunizations Pneumococcal Vaccine: Received Pneumococcal Vaccination: No Implantable Devices No devices added Hospitalization / Surgery History Type of Hospitalization/Surgery 06/2021 inguinal hernia repair cardioversion 09/2020 ventral hernia repair 3/19 Family and Social History Cancer: Yes - Father; Diabetes: Yes - Mother,Father;  Heart Disease: Yes - Mother,Siblings; Hypertension: Yes - Mother,Father; Former smoker - quit 1970; Marital Status - Married; Drug Use: No History; Caffeine Use: Moderate - coffee; Financial Concerns: No; Food, Clothing or Shelter Needs: No; Support System Lacking: No; Transportation Concerns: No Electronic Signature(s) Signed: 11/28/2021 1:43:05 PM By: Kalman Shan DO Signed: 11/28/2021 3:51:06 PM By: Deon Pilling RN, BSN Entered By: Kalman Shan on 11/28/2021 13:34:20 -------------------------------------------------------------------------------- SuperBill Details Patient Name: Date of Service: Corey [name], [name] 11/28/2021 Medical Record  Number: [id number] Patient Account Number: [id number] Date of Birth/Sex: Treating RN: 1946-02-08 (76 y.o. Corey [name] Primary Care Provider: Billey Gosling Other Clinician: Referring Provider: Treating Provider/Extender: [name] in Treatment: 3 Diagnosis Coding ICD-10 Codes Code Description [id number] 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 with other specified severity E11.622 Type 2 diabetes mellitus with other skin ulcer I50.42 Chronic combined systolic (congestive) and diastolic (congestive) heart failure I25.119 Atherosclerotic heart disease of native coronary artery with unspecified angina pectoris Facility Procedures CPT4 Code: [id number] Description: 29924 - DEB SUBQ TISSUE 20 SQ CM/< ICD-10 Diagnosis Description L97.918 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 with other spe Modifier: cified severity Quantity: 1 Physician Procedures : CPT4 Code Description Modifier [id number] - WC PHYS LEVEL 4 - EST PT ICD-10 Diagnosis Description L97.918 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 with other specified severity E11.622 Type 2 diabetes mellitus with other  skin ulcer I50.42 Chronic combined systolic (congestive) and diastolic (congestive) heart failure I25.119 Atherosclerotic heart disease of native coronary artery with unspecified angina pectoris Quantity: 1 : [id number] 11042 - WC PHYS SUBQ TISS 20 SQ CM ICD-10 Diagnosis Description L97.918 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 with other specified severity Quantity: 1 : [id number] 11045 - WC PHYS SUBQ TISS EA ADDL 20 CM ICD-10 Diagnosis Description L97.918 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 with other specified severity Quantity: 4 Electronic Signature(s) Signed: 11/28/2021 1:43:05 PM By: Kalman Shan DO Entered By: Kalman Shan on 11/28/2021 13:37:25

## 2021-12-02 ENCOUNTER — Telehealth: Payer: Self-pay

## 2021-12-02 NOTE — Telephone Encounter (Signed)
What dose of duculax is he taking?  Can take 60 ml per day.

## 2021-12-02 NOTE — Telephone Encounter (Signed)
Spoke with PT and ended up calling the patient.  Spoke with his wife and she will either give him Duculax or Miralax.  Will call on Monday to see how patient is doing.

## 2021-12-02 NOTE — Telephone Encounter (Signed)
Herbert Deaner PT from [facility] calling to report that patient has been feeling constipated for last couple days and his distress level is 7/10. He is wondering what would be the next options in regards to medication as they have already tried Doculax.   CB: [phone] Clair Gulling

## 2021-12-05 ENCOUNTER — Encounter (HOSPITAL_BASED_OUTPATIENT_CLINIC_OR_DEPARTMENT_OTHER): Payer: Medicare HMO | Admitting: Internal Medicine

## 2021-12-05 DIAGNOSIS — L97918 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 with other specified severity: Secondary | ICD-10-CM | POA: Diagnosis not present

## 2021-12-05 DIAGNOSIS — N184 Chronic kidney disease, stage 4 (severe): Secondary | ICD-10-CM | POA: Diagnosis not present

## 2021-12-05 DIAGNOSIS — I5042 Chronic combined systolic (congestive) and diastolic (congestive) heart failure: Secondary | ICD-10-CM | POA: Diagnosis not present

## 2021-12-05 DIAGNOSIS — I25119 Atherosclerotic heart disease of native coronary artery with unspecified angina pectoris: Secondary | ICD-10-CM | POA: Diagnosis not present

## 2021-12-05 DIAGNOSIS — I130 Hypertensive heart and chronic kidney disease with heart failure and stage 1 through stage 4 chronic kidney disease, or unspecified chronic kidney disease: Secondary | ICD-10-CM | POA: Diagnosis not present

## 2021-12-05 DIAGNOSIS — E1122 Type 2 diabetes mellitus with diabetic chronic kidney disease: Secondary | ICD-10-CM | POA: Diagnosis not present

## 2021-12-05 DIAGNOSIS — R6889 Other general symptoms and signs: Secondary | ICD-10-CM | POA: Diagnosis not present

## 2021-12-05 DIAGNOSIS — E11622 Type 2 diabetes mellitus with other skin ulcer: Secondary | ICD-10-CM | POA: Diagnosis not present

## 2021-12-05 NOTE — Progress Notes (Signed)
RASHIDI, LOH ([id number]) Visit Report for 12/05/2021 Chief Complaint Document Details Patient Name: Date of Service: Corey [name]. 12/05/2021 11:00 A M Medical Record Number: [id number] Patient Account Number: [id number] Date of Birth/Sex: Treating RN: 19-Jan-1946 (76 y.o. Corey [name] Primary Care Provider: Billey [name] Other Clinician: Referring Provider: Treating Provider/Extender: Corey [name] Weeks in Treatment: 4 Information Obtained from: Patient Chief Complaint 11/01/2021; right lower extremity wound Electronic Signature(s) Signed: 12/05/2021 3:08:01 PM By: Kalman Shan DO Entered By: Kalman Shan on 12/05/2021 11:29:03 -------------------------------------------------------------------------------- Debridement Details Patient Name: Date of Service: [name], [name] 12/05/2021 11:00 A M Medical Record Number: [id number] Patient Account Number: [id number] Date of Birth/Sex: Treating RN: 04-May-1946 (76 y.o. Corey [name] Primary Care Provider: Billey [name] Other Clinician: Referring Provider: Treating Provider/Extender: Mickle Asper in Treatment: 4 Debridement Performed for Assessment: Wound #1 Right,Lateral Lower Leg Performed By: Physician Kalman Shan, DO Debridement Type: Debridement Severity of Tissue Pre Debridement: Fat layer exposed Level of Consciousness (Pre-procedure): Awake and Alert Pre-procedure Verification/Time Out Yes - 11:15 Taken: Start Time: 11:16 Pain Control: Lidocaine 5% topical ointment T Area Debrided (L x W): otal 11.8 (cm) x 9.3 (cm) = 109.74 (cm) Tissue and other material debrided: Viable, Non-Viable, Eschar, Slough, Subcutaneous, Slough Level: Skin/Subcutaneous Tissue Debridement Description: Excisional Instrument: Curette Bleeding: Minimum Hemostasis Achieved: Pressure End Time: 11:20 Procedural Pain: 0 Post Procedural Pain: 0 Response to Treatment: Procedure was tolerated  well Level of Consciousness (Post- Awake and Alert procedure): Post Debridement Measurements of Total Wound Length: (cm) 11.8 Width: (cm) 9.3 Depth: (cm) 0.2 Volume: (cm) 17.238 Character of Wound/Ulcer Post Debridement: Requires Further Debridement Severity of Tissue Post Debridement: Fat layer exposed Post Procedure Diagnosis Same as Pre-procedure Electronic Signature(s) Signed: 12/05/2021 3:08:01 PM By: Kalman Shan DO Signed: 12/05/2021 4:56:39 PM By: Deon Pilling RN, BSN Entered By: Deon Pilling on 12/05/2021 11:20:18 -------------------------------------------------------------------------------- HPI Details Patient Name: Date of Service: [name], [name] 12/05/2021 11:00 A M Medical Record Number: [id number] Patient Account Number: [id number] Date of Birth/Sex: Treating RN: February 01, 1946 (76 y.o. Corey [name] Primary Care Provider: Billey [name] Other Clinician: Referring Provider: Treating Provider/Extender: Corey [name] Weeks in Treatment: 4 History of Present Illness HPI Description: Admission 11/01/2021 Corey [name] Is a 76 year old male with a past medical history of of type 2 diabetes, coronary artery disease, stage IV kidney disease acute on chronic combined systolic and diastolic heart failure that presents to the clinic for a 73-monthhistory of right lower extremity wound. He states he is not sure how this started. It is completely eschared. He reports his wound has been stable for the past month. He has been keeping the area covered. He has mild chronic pain. He denies signs of infection. He states he has a history of lower extremity ulcers last being on the left leg. He could not tell me how this was treated in the past. 6/27; patient presents for follow-up. He is scheduled for his lower extremity arterial studies on July 6th. He has been using Medihoney to the wound bed. He has no issues or complaints today. He denies signs of  infection. 7/6; patient presents for follow-up. He has home health that reported the wound having odor and increased pain last week. He was sent to urgent care. He was started on doxycycline. He is continuing Medihoney. He has his scheduled ABIs w/TBIs today. 7/10; patient presents for follow-up. He had his arterial studies  done last week. He had triphasic waveforms throughout the right lower extremity. His TBI was 0.64. He has good pedal pulses. He is almost done with his course of antibiotics. He has been using mupirocin ointment to the wound bed. 7/17; patient presents for follow-up. He has been using Iodosorb under compression therapy. He has no issues or complaints today. He states he is receiving [facility] antibiotic tomorrow In the mail. 7/24; patient presents for follow-up. He has been doing Dakin's wet-to-dry dressings to the wound bed daily without issues. He currently denies signs of infection. He reports completing his course of doxycycline. He has his [facility] antibiotic with him today. Electronic Signature(s) Signed: 12/05/2021 3:08:01 PM By: Kalman Shan DO Entered By: Kalman Shan on 12/05/2021 11:29:33 -------------------------------------------------------------------------------- Physical Exam Details Patient Name: Date of Service: [name], [name] 12/05/2021 11:00 A M Medical Record Number: [id number] Patient Account Number: [id number] Date of Birth/Sex: Treating RN: 1945-12-09 (76 y.o. Corey [name] Primary Care Provider: Billey [name] Other Clinician: Referring Provider: Treating Provider/Extender: Corey [name] Weeks in Treatment: 4 Constitutional respirations regular, non-labored and within target range for patient.. Cardiovascular 2+ dorsalis pedis/posterior tibialis pulses. Psychiatric pleasant and cooperative. Notes Right lower extremity: T the anterior aspect there is a wound with tightly adhered necrotic tissue, Nonviable tissue and  granulation tissue . No signs of o surrounding infection. Electronic Signature(s) Signed: 12/05/2021 3:08:01 PM By: Kalman Shan DO Entered By: Kalman Shan on 12/05/2021 11:30:43 -------------------------------------------------------------------------------- Physician Orders Details Patient Name: Date of Service: [name], [name] 12/05/2021 11:00 A M Medical Record Number: [id number] Patient Account Number: [id number] Date of Birth/Sex: Treating RN: 1945/05/28 (76 y.o. Corey [name] Primary Care Provider: Billey [name] Other Clinician: Referring Provider: Treating Provider/Extender: Mickle Asper in Treatment: 4 Verbal / Phone Orders: No Diagnosis Coding ICD-10 Coding Code Description N82.956 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 with other specified severity E11.622 Type 2 diabetes mellitus with other skin ulcer I50.42 Chronic combined systolic (congestive) and diastolic (congestive) heart failure I25.119 Atherosclerotic heart disease of native coronary artery with unspecified angina pectoris Follow-up Appointments ppointment in 1 week. - Dr. Heber [place] and [place], Room 8 Monday 1115 12/12/2021 Return A ppointment in 2 weeks. - Dr. Heber [place] and [place], Room 6 overflow Monday 1045 12/19/2021 Return A Other: - ****[facility] topical compounding antibiotics bring to appt each time.**** Bathing/ Shower/ Hygiene May shower with protection but do not get wound dressing(s) wet. Edema Control - Lymphedema / SCD / Other Elevate legs to the level of the heart or above for 30 minutes daily and/or when sitting, a frequency of: - 3-4 times a day throughout the day. Avoid standing for long periods of time. Home Health New wound care orders this week; continue Home Health for wound care. May utilize formulary equivalent dressing for wound treatment orders unless otherwise specified. - home health to twice a week. Compounding topical  antibiotics directly to wound bed and then apply Iodosorb. Other Home Health Orders/Instructions: - [facility] Wound Treatment Wound #1 - Lower Leg Wound Laterality: Right, Lateral Cleanser: Soap and Water (Home Health) 3 x Per Week/30 Days Discharge Instructions: May shower and wash wound with dial antibacterial soap and water prior to dressing change. Cleanser: Wound Cleanser (Home Health) 3 x Per Week/30 Days Discharge Instructions: Cleanse the wound with wound cleanser prior to applying a clean dressing using gauze sponges, not tissue or cotton balls. Prim Dressing: Iodosorb Gel 10 (gm)  Tube (Home Health) 3 x Per Week/30 Days ary Discharge Instructions: or iodoflex over [facility] topical antibiotics Prim Dressing: Compounding T ary opical antibiotics (Home Health) 3 x Per Week/30 Days Discharge Instructions: apply the [facility] compounding topical antibiotics directly to wound bed. Secondary Dressing: ABD Pad, 8x10 (Home Health) 3 x Per Week/30 Days Discharge Instructions: Apply over primary dressing as directed. Secondary Dressing: CarboFLEX Odor Control Dressing, 4x4 in 3 x Per Week/30 Days Discharge Instructions: Apply over primary dressing as directed. Secondary Dressing: Woven Gauze Sponge, Non-Sterile 4x4 in (Home Health) 3 x Per Week/30 Days Discharge Instructions: Apply over primary dressing as directed. Secondary Dressing: Zetuvit Plus 4x8 in (Home Health) 3 x Per Week/30 Days Discharge Instructions: Apply over primary dressing as directed. Compression Wrap: Kerlix Roll 4.5x3.1 (in/yd) (Home Health) 3 x Per Week/30 Days Discharge Instructions: Apply Kerlix and Coban compression as directed. Compression Wrap: Coban Self-Adherent Wrap 4x5 (in/yd) (Home Health) 3 x Per Week/30 Days Discharge Instructions: Apply over Kerlix as directed. Electronic Signature(s) Signed: 12/05/2021 3:08:01 PM By: Kalman Shan DO Entered By: Kalman Shan on 12/05/2021  11:30:49 -------------------------------------------------------------------------------- Problem List Details Patient Name: Date of Service: [name], [name] 12/05/2021 11:00 A M Medical Record Number: [id number] Patient Account Number: [id number] Date of Birth/Sex: Treating RN: 1945/07/01 (76 y.o. Corey [name] Primary Care Provider: Billey [name] Other Clinician: Referring Provider: Treating Provider/Extender: Mickle Asper in Treatment: 4 Active Problems ICD-10 Encounter Code Description Active Date MDM Diagnosis L97.918 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 with other 11/01/2021 No Yes specified severity E11.622 Type 2 diabetes mellitus with other skin ulcer 11/01/2021 No Yes I50.42 Chronic combined systolic (congestive) and diastolic (congestive) heart failure 11/01/2021 No Yes I25.119 Atherosclerotic heart disease of native coronary artery with unspecified angina 11/01/2021 No Yes pectoris Inactive Problems Resolved Problems Electronic Signature(s) Signed: 12/05/2021 3:08:01 PM By: Kalman Shan DO Entered By: Kalman Shan on 12/05/2021 11:28:55 -------------------------------------------------------------------------------- Progress Note Details Patient Name: Date of Service: [name], [name] 12/05/2021 11:00 A M Medical Record Number: [id number] Patient Account Number: [id number] Date of Birth/Sex: Treating RN: [birth year] (76 y.o. Corey [name] Primary Care Provider: Billey [name] Other Clinician: Referring Provider: Treating Provider/Extender: Mickle Asper in Treatment: 4 Subjective Chief Complaint Information obtained from Patient 11/01/2021; right lower extremity wound History of Present Illness (HPI) Admission 11/01/2021 Mr. Jemell Town Is a 76 year old male with a past medical history of of type 2 diabetes, coronary artery disease, stage IV kidney disease acute on chronic combined  systolic and diastolic heart failure that presents to the clinic for a 72-monthhistory of right lower extremity wound. He states he is not sure how this started. It is completely eschared. He reports his wound has been stable for the past month. He has been keeping the area covered. He has mild chronic pain. He denies signs of infection. He states he has a history of lower extremity ulcers last being on the left leg. He could not tell me how this was treated in the past. 6/27; patient presents for follow-up. He is scheduled for his lower extremity arterial studies on July 6th. He has been using Medihoney to the wound bed. He has no issues or complaints today. He denies signs of infection. 7/6; patient presents for follow-up. He has home health that reported the wound having odor and increased pain last week. He was sent to urgent care. He was started on doxycycline. He is  continuing Medihoney. He has his scheduled ABIs w/TBIs today. 7/10; patient presents for follow-up. He had his arterial studies done last week. He had triphasic waveforms throughout the right lower extremity. His TBI was 0.64. He has good pedal pulses. He is almost done with his course of antibiotics. He has been using mupirocin ointment to the wound bed. 7/17; patient presents for follow-up. He has been using Iodosorb under compression therapy. He has no issues or complaints today. He states he is receiving [facility] antibiotic tomorrow In the mail. 7/24; patient presents for follow-up. He has been doing Dakin's wet-to-dry dressings to the wound bed daily without issues. He currently denies signs of infection. He reports completing his course of doxycycline. He has his [facility] antibiotic with him today. Patient History Family History Cancer - Father, Diabetes - Mother,Father, Heart Disease - Mother,Siblings, Hypertension - Mother,Father. Social History Former smoker - quit 1970, Marital Status - Married, Drug Use - No History,  Caffeine Use - Moderate - coffee. Medical History Eyes Patient has history of Cataracts Respiratory Patient has history of Sleep Apnea - has CPAP does not wear Cardiovascular Patient has history of Congestive Heart Failure, Hypertension, Myocardial Infarction - 2014 Endocrine Patient has history of Type II Diabetes Musculoskeletal Patient has history of Gout Neurologic Denies history of Seizure Disorder Hospitalization/Surgery History - 06/2021 inguinal hernia repair. - cardioversion 09/2020. - ventral hernia repair 3/19. 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 Vitals Time Taken: 10:43 AM, Height: 66 in, Weight: 145 lbs, BMI: 23.4, Temperature: 97.5 F, Pulse: 46 bpm, Respiratory Rate: 18 breaths/min, Blood Pressure: 150/87 mmHg. Cardiovascular 2+ dorsalis pedis/posterior tibialis pulses. Psychiatric pleasant and cooperative. General Notes: Right lower extremity: T the anterior aspect there is a wound with tightly adhered necrotic tissue, Nonviable tissue and granulation tissue . No o signs of surrounding infection. Integumentary (Hair, Skin) Wound #1 status is Open. Original cause of wound was Gradually Appeared. The date acquired was: 08/15/2021. The wound has been in treatment 4 weeks. The wound is located on the Right,Lateral Lower Leg. The wound measures 11.8cm length x 9.3cm width x 0.2cm depth; 86.19cm^2 area and 17.238cm^3 volume. There is a none present amount of drainage noted. 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 Type 2 diabetes mellitus with other skin ulcer Chronic combined systolic (congestive) and diastolic  (congestive) heart failure Atherosclerotic heart disease of native coronary artery with unspecified angina pectoris Patient's wound is stable. There is less necrotic debris today. I debrided nonviable tissue. At this time I recommended [facility] antibiotics with Iodoflex under Kerlix/Coban. Patient has home health who will change the dressing twice weekly. Follow-up in 1 week in our clinic. Procedures Wound #1 Pre-procedure diagnosis of Wound #1 is a Diabetic Wound/Ulcer of the Lower Extremity located on the Right,Lateral Lower Leg .Severity of Tissue Pre Debridement is: Fat layer exposed. There was a Excisional Skin/Subcutaneous Tissue Debridement with a total area of 109.74 sq cm performed by Kalman Shan, DO. With the following instrument(s): Curette to remove Viable and Non-Viable tissue/material. Material removed includes Eschar, Subcutaneous Tissue, and Slough after achieving pain control using Lidocaine 5% topical ointment. A time out was conducted at 11:15, prior to the start of the procedure. A Minimum amount of bleeding was controlled with Pressure. The procedure was tolerated well  with a pain level of 0 throughout and a pain level of 0 following the procedure. Post Debridement Measurements: 11.8cm length x 9.3cm width x 0.2cm depth; 17.238cm^3 volume. Character of Wound/Ulcer Post Debridement requires further debridement. Severity of Tissue Post Debridement is: Fat layer exposed. Post procedure Diagnosis Wound #1: Same as Pre-Procedure Plan Follow-up Appointments: Return Appointment in 1 week. - Dr. Heber [place] and [place], Room 8 Monday 1115 12/12/2021 Return Appointment in 2 weeks. - Dr. Heber [place] and [place], Room 6 overflow Monday 1045 12/19/2021 Other: - ****[facility] topical compounding antibiotics bring to appt each time.**** Bathing/ Shower/ Hygiene: May shower with protection but do not get wound dressing(s) wet. Edema Control - Lymphedema / SCD / Other: Elevate legs to the  level of the heart or above for 30 minutes daily and/or when sitting, a frequency of: - 3-4 times a day throughout the day. Avoid standing for long periods of time. Home Health: New wound care orders this week; continue Home Health for wound care. May utilize formulary equivalent dressing for wound treatment orders unless otherwise specified. - home health to twice a week. Compounding topical antibiotics directly to wound bed and then apply Iodosorb. Other Home Health Orders/Instructions: - [facility] WOUND #1: - Lower Leg Wound Laterality: Right, Lateral Cleanser: Soap and Water (Home Health) 3 x Per Week/30 Days Discharge Instructions: May shower and wash wound with dial antibacterial soap and water prior to dressing change. Cleanser: Wound Cleanser (Home Health) 3 x Per Week/30 Days Discharge Instructions: Cleanse the wound with wound cleanser prior to applying a clean dressing using gauze sponges, not tissue or cotton balls. Prim Dressing: Iodosorb Gel 10 (gm) Tube (Home Health) 3 x Per Week/30 Days ary Discharge Instructions: or iodoflex over [facility] topical antibiotics Prim Dressing: Compounding T ary opical antibiotics (Home Health) 3 x Per Week/30 Days Discharge Instructions: apply the [facility] compounding topical antibiotics directly to wound bed. Secondary Dressing: ABD Pad, 8x10 (Home Health) 3 x Per Week/30 Days Discharge Instructions: Apply over primary dressing as directed. Secondary Dressing: CarboFLEX Odor Control Dressing, 4x4 in 3 x Per Week/30 Days Discharge Instructions: Apply over primary dressing as directed. Secondary Dressing: Woven Gauze Sponge, Non-Sterile 4x4 in (Home Health) 3 x Per Week/30 Days Discharge Instructions: Apply over primary dressing as directed. Secondary Dressing: Zetuvit Plus 4x8 in (Home Health) 3 x Per Week/30 Days Discharge Instructions: Apply over primary dressing as directed. Compression Wrap: Kerlix Roll 4.5x3.1 (in/yd)  (Home Health) 3 x Per Week/30 Days Discharge Instructions: Apply Kerlix and Coban compression as directed. Compression Wrap: Coban Self-Adherent Wrap 4x5 (in/yd) (Home Health) 3 x Per Week/30 Days Discharge Instructions: Apply over Kerlix as directed. 1. In office sharp debridement 2. Iodoflex with [facility] antibiotic under Kerlix/Coban 3. Follow-up in 1 week Electronic Signature(s) Signed: 12/05/2021 3:08:01 PM By: Kalman Shan DO Entered By: Kalman Shan on 12/05/2021 11:32:06 -------------------------------------------------------------------------------- HxROS Details Patient Name: Date of Service: [name], [name] 12/05/2021 11:00 A M Medical Record Number: [id number] Patient Account Number: [id number] Date of Birth/Sex: Treating RN: July 30, 1945 (76 y.o. Corey [name] Primary Care Provider: Billey [name] Other Clinician: Referring Provider: Treating Provider/Extender: Corey [name] Weeks in Treatment: 4 Constitutional Symptoms (General Health) Medical History: Past Medical History Notes: CVA 2014 bilateral hearing loss Eyes Medical History: Positive for: Cataracts Respiratory Medical History: Positive for: Sleep Apnea - has CPAP does not wear Cardiovascular Medical History: Positive for: Congestive Heart Failure; Hypertension; Myocardial Infarction - 2014 Gastrointestinal Medical History: Past Medical  History Notes: Diverticulosis Endocrine Medical History: Positive for: Type II Diabetes Time with diabetes: >20 years Treated with: Oral agents, Diet Blood sugar tested every day: Yes Tested : BID Genitourinary Medical History: Past Medical History Notes: Renal insufficiency 20/ Musculoskeletal Medical History: Positive for: Gout Neurologic Medical History: Negative for: Seizure Disorder Oncologic Medical History: Past Medical History Notes: Prostate Ca HBO Extended History Items Eyes: Cataracts Immunizations Pneumococcal  Vaccine: Received Pneumococcal Vaccination: No Implantable Devices No devices added Hospitalization / Surgery History Type of Hospitalization/Surgery 06/2021 inguinal hernia repair cardioversion 09/2020 ventral hernia repair 3/19 Family and Social History Cancer: Yes - Father; Diabetes: Yes - Mother,Father; Heart Disease: Yes - Mother,Siblings; Hypertension: Yes - Mother,Father; Former smoker - quit 1970; Marital Status - Married; Drug Use: No History; Caffeine Use: Moderate - coffee; Financial Concerns: No; Food, Clothing or Shelter Needs: No; Support System Lacking: No; Transportation Concerns: No Electronic Signature(s) Signed: 12/05/2021 3:08:01 PM By: Kalman Shan DO Signed: 12/05/2021 4:56:39 PM By: Deon Pilling RN, BSN Entered By: Kalman Shan on 12/05/2021 11:29:41 -------------------------------------------------------------------------------- SuperBill Details Patient Name: Date of Service: [name], [name] 12/05/2021 Medical Record Number: [id number] Patient Account Number: [id number] Date of Birth/Sex: Treating RN: 15-Mar-1946 (76 y.o. Corey [name] Primary Care Provider: Billey [name] Other Clinician: Referring Provider: Treating Provider/Extender: Corey [name] Weeks in Treatment: 4 Diagnosis Coding ICD-10 Codes Code Description [phone] 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 with other specified severity E11.622 Type 2 diabetes mellitus with other skin ulcer I50.42 Chronic combined systolic (congestive) and diastolic (congestive) heart failure I25.119 Atherosclerotic heart disease of native coronary artery with unspecified angina pectoris Facility Procedures CPT4 Code: [id number] Description: 00174 - DEB SUBQ TISSUE 20 SQ CM/< ICD-10 Diagnosis Description L97.918 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 with other spe Modifier: cified severity Quantity: 1 CPT4 Code: [id number] Description:  11045 - DEB SUBQ TISS EA ADDL 20CM ICD-10 Diagnosis Description L97.918 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 with other spe Modifier: cified severity Quantity: 5 Physician Procedures : CPT4 Code Description Modifier [id number] 11042 - WC PHYS SUBQ TISS 20 SQ CM ICD-10 Diagnosis Description L97.918 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 with other specified severity Quantity: 1 : [id number] 17793 - WC PHYS SUBQ TISS EA ADDL 20 CM ICD-10 Diagnosis Description L97.918 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 with other specified severity Quantity: 5 Electronic Signature(s) Signed: 12/05/2021 3:08:01 PM By: Kalman Shan DO Entered By: Kalman Shan on 12/05/2021 11:32:16

## 2021-12-05 NOTE — Progress Notes (Signed)
KERRIGAN, GLENDENING ([id number]) Visit Report for 12/05/2021 Arrival Information Details Patient Name: Date of Service: Corey [name]. 12/05/2021 11:00 A M Medical Record Number: [id number] Patient Account Number: [id number] Date of Birth/Sex: Treating RN: 1945-11-18 (76 y.o. [name] Primary Care Wells Gerdeman: Billey Gosling Other Clinician: Referring Eilyn Polack: Treating Loy Little/Extender: Mickle Asper in Treatment: 4 Visit Information History Since Last Visit Added or deleted any medications: No Patient Arrived: Ambulatory Any new allergies or adverse reactions: No Arrival Time: 10:40 Had a fall or experienced change in No Accompanied By: self activities of daily living that may affect Transfer Assistance: None risk of falls: Patient Identification Verified: Yes Signs or symptoms of abuse/neglect since last visito No Secondary Verification Process Completed: Yes Hospitalized since last visit: No Patient Requires Transmission-Based No Implantable device outside of the clinic excluding No Precautions: cellular tissue based products placed in the center Patient Has Alerts: Yes since last visit: Patient Alerts: in clinic R [place] Has Dressing in Place as Prescribed: Yes 11/17/2021 ABIs: both [place] Pain Present Now: Yes 11/17/2021 TBI: R0.64 L0.58 Electronic Signature(s) Signed: 12/05/2021 3:30:20 PM By: Erenest Blank Entered By: Erenest Blank on 12/05/2021 10:54:39 -------------------------------------------------------------------------------- Encounter Discharge Information Details Patient Name: Date of Service: [name], [name] 12/05/2021 11:00 A M Medical Record Number: [id number] Patient Account Number: [id number] Date of Birth/Sex: Treating RN: 1946/01/03 (76 y.o. [name] Primary Care Burel Kahre: Billey Gosling Other Clinician: Referring Kamaal Cast: Treating Verenise Moulin/Extender: Mickle Asper in Treatment: 4 Encounter Discharge  Information Items Post Procedure Vitals Discharge Condition: Stable Temperature (F): 98.7 Ambulatory Status: Ambulatory Pulse (bpm): 46 Discharge Destination: Home Respiratory Rate (breaths/min): 16 Transportation: Private Auto Blood Pressure (mmHg): 150/87 Accompanied By: self Schedule Follow-up Appointment: Yes Clinical Summary of Care: Electronic Signature(s) Signed: 12/05/2021 4:56:39 PM By: Deon Pilling RN, BSN Entered By: Deon Pilling on 12/05/2021 11:26:13 -------------------------------------------------------------------------------- Lower Extremity Assessment Details Patient Name: Date of Service: [name], [name] 12/05/2021 11:00 A M Medical Record Number: [id number] Patient Account Number: [id number] Date of Birth/Sex: Treating RN: 08/09/1945 (76 y.o. [name] Primary Care Naveed Humphres: Billey Gosling Other Clinician: Referring Archer Moist: Treating Daanya Lanphier/Extender: [name] in Treatment: 4 Edema Assessment Assessed: [Left: No] [Right: No] Edema: [Left: N] [Right: o] Calf Left: Right: Point of Measurement: 33 cm From Medial Instep 35.7 cm Ankle Left: Right: Point of Measurement: 11 cm From Medial Instep 23.5 cm Electronic Signature(s) Signed: 12/05/2021 3:30:20 PM By: Erenest Blank Signed: 12/05/2021 4:56:39 PM By: Deon Pilling RN, BSN Entered By: Erenest Blank on 12/05/2021 10:55:00 -------------------------------------------------------------------------------- Multi Wound Chart Details Patient Name: Date of Service: [name], [name] 12/05/2021 11:00 A M Medical Record Number: [id number] Patient Account Number: [id number] Date of Birth/Sex: Treating RN: [birth year] (76 y.o. [name] Primary Care Taseen Marasigan: Billey Gosling Other Clinician: Referring Trevione Wert: Treating Ivorie Uplinger/Extender: [name] in Treatment: 4 Vital Signs Height(in): 66 Pulse(bpm): 48 Weight(lbs): 145 Blood  Pressure(mmHg): 150/87 Body Mass Index(BMI): 23.4 Temperature(F): 97.5 Respiratory Rate(breaths/min): 18 Photos: [1:No Photos Right, Lateral Lower Leg] [N/A:N/A N/A] Wound Location: [1:Gradually Appeared] [N/A:N/A] Wounding Event: [1:Diabetic Wound/Ulcer of the Lower] [N/A:N/A] Primary Etiology: [1:Extremity 08/15/2021] [N/A:N/A] Date Acquired: [1:4] [N/A:N/A] Weeks of Treatment: [1:Open] [N/A:N/A] Wound Status: [1:No] [N/A:N/A] Wound Recurrence: [1:11.8x9.3x0.2] [N/A:N/A] Measurements L x W x D (cm) [1:86.19] [N/A:N/A] A (cm) : rea [1:17.238] [N/A:N/A] Volume (cm) : [1:-299.10%] [N/A:N/A] % Reduction in Area: [1:-698.10%] [N/A:N/A] % Reduction in Volume: [  1:Grade 2] [N/A:N/A] Classification: [1:None Present] [N/A:N/A] Exudate Amount: [1:Debridement - Excisional] [N/A:N/A] Debridement: [1:11:15] [N/A:N/A] Pre-procedure Verification/Time Out Taken: [1:Lidocaine 5% topical ointment] [N/A:N/A] Pain Control: [1:Necrotic/Eschar, Subcutaneous,] [N/A:N/A] Tissue Debrided: [1:Slough Skin/Subcutaneous Tissue] [N/A:N/A] Level: [1:109.74] [N/A:N/A] Debridement A (sq cm): [1:rea Curette] [N/A:N/A] Instrument: [1:Minimum] [N/A:N/A] Bleeding: [1:Pressure] [N/A:N/A] Hemostasis Achieved: [1:0] [N/A:N/A] Procedural Pain: [1:0] [N/A:N/A] Post Procedural Pain: Debridement Treatment Response: Procedure was tolerated well [N/A:N/A] Post Debridement Measurements L x 11.8x9.3x0.2 [N/A:N/A] W x D (cm) [1:17.238] [N/A:N/A] Post Debridement Volume: (cm) [1:Debridement] [N/A:N/A] Treatment Notes Wound #1 (Lower Leg) Wound Laterality: Right, Lateral Cleanser Soap and Water Discharge Instruction: May shower and wash wound with dial antibacterial soap and water prior to dressing change. Wound Cleanser Discharge Instruction: Cleanse the wound with wound cleanser prior to applying a clean dressing using gauze sponges, not tissue or cotton balls. Peri-Wound Care Topical Primary Dressing Iodosorb Gel  10 (gm) Tube Discharge Instruction: or iodoflex over keystone topical antibiotics Compounding T opical antibiotics Discharge Instruction: apply the [facility] compounding topical antibiotics directly to wound bed. Secondary Dressing ABD Pad, 8x10 Discharge Instruction: Apply over primary dressing as directed. CarboFLEX Odor Control Dressing, 4x4 in Discharge Instruction: Apply over primary dressing as directed. Woven Gauze Sponge, Non-Sterile 4x4 in Discharge Instruction: Apply over primary dressing as directed. Zetuvit Plus 4x8 in Discharge Instruction: Apply over primary dressing as directed. Secured With Compression Wrap Kerlix Roll 4.5x3.1 (in/yd) Discharge Instruction: Apply Kerlix and Coban compression as directed. Coban Self-Adherent Wrap 4x5 (in/yd) Discharge Instruction: Apply over Kerlix as directed. Compression Stockings Add-Ons Electronic Signature(s) Signed: 12/05/2021 3:08:01 PM By: Kalman Shan DO Signed: 12/05/2021 4:56:39 PM By: Deon Pilling RN, BSN Entered By: Kalman Shan on 12/05/2021 11:28:58 -------------------------------------------------------------------------------- Multi-Disciplinary Care Plan Details Patient Name: Date of Service: [name], [name] 12/05/2021 11:00 A M Medical Record Number: [id number] Patient Account Number: [id number] Date of Birth/Sex: Treating RN: [birth year] (76 y.o. [name] Primary Care Dystany Duffy: Billey Gosling Other Clinician: Referring Koki Buxton: Treating Danylah Holden/Extender: Mickle Asper in Treatment: 4 Active Inactive Necrotic Tissue Nursing Diagnoses: Knowledge deficit related to management of necrotic/devitalized tissue Goals: Necrotic/devitalized tissue will be minimized in the wound bed Date Initiated: 11/01/2021 Target Resolution Date: 01/13/2022 Goal Status: Active Patient/caregiver will verbalize understanding of reason and process for debridement of necrotic  tissue Date Initiated: 11/01/2021 Date Inactivated: 12/05/2021 Target Resolution Date: 12/09/2021 Goal Status: Met Interventions: Assess patient pain level pre-, during and post procedure and prior to discharge Provide education on necrotic tissue and debridement process Treatment Activities: Apply topical anesthetic as ordered : 11/01/2021 Enzymatic debridement : 11/01/2021 Excisional debridement : 11/01/2021 Notes: Pain, Acute or Chronic Nursing Diagnoses: Pain, acute or chronic: actual or potential Potential alteration in comfort, pain Goals: Patient will verbalize adequate pain control and receive pain control interventions during procedures as needed Date Initiated: 11/01/2021 Date Inactivated: 11/21/2021 Target Resolution Date: 12/09/2021 Goal Status: Met Patient/caregiver will verbalize comfort level met Date Initiated: 11/01/2021 Target Resolution Date: 01/05/2022 Goal Status: Active Interventions: Complete pain assessment as per visit requirements Provide education on pain management Treatment Activities: Administer pain control measures as ordered : 11/01/2021 Notes: Electronic Signature(s) Signed: 12/05/2021 4:56:39 PM By: Deon Pilling RN, BSN Entered By: Deon Pilling on 12/05/2021 10:32:29 -------------------------------------------------------------------------------- Pain Assessment Details Patient Name: Date of Service: [name], [name] 12/05/2021 11:00 A M Medical Record Number: [id number] Patient Account Number: [id number] Date of Birth/Sex: Treating RN: [birth year] (76 y.o. [name] Primary Care Masiyah Jorstad: Other Clinician: Quay Burow,  Marzetta Board Referring Willard Farquharson: Treating Tam Savoia/Extender: Mickle Asper in Treatment: 4 Active Problems Location of Pain Severity and Description of Pain Patient Has Paino Yes Site Locations Pain Location: Pain in Ulcers Rate the pain. Current Pain Level: 8 Pain Management and Medication Current Pain  Management: Electronic Signature(s) Signed: 12/05/2021 3:30:20 PM By: Erenest Blank Signed: 12/05/2021 4:56:39 PM By: Deon Pilling RN, BSN Entered By: Erenest Blank on 12/05/2021 10:41:08 -------------------------------------------------------------------------------- Patient/Caregiver Education Details Patient Name: Date of Service: Corey [name] 7/24/2023andnbsp11:00 A M Medical Record Number: [id number] Patient Account Number: [id number] Date of Birth/Gender: Treating RN: [birth year] (76 y.o. [name] Primary Care Physician: Billey Gosling Other Clinician: Referring Physician: Treating Physician/Extender: Mickle Asper in Treatment: 4 Education Assessment Education Provided To: Patient Education Topics Provided Wound Debridement: Handouts: Wound Debridement Methods: Explain/Verbal Responses: Reinforcements needed Electronic Signature(s) Signed: 12/05/2021 4:56:39 PM By: Deon Pilling RN, BSN Entered By: Deon Pilling on 12/05/2021 10:32:40 -------------------------------------------------------------------------------- Wound Assessment Details Patient Name: Date of Service: [name], [name] 12/05/2021 11:00 A M Medical Record Number: [id number] Patient Account Number: [id number] Date of Birth/Sex: Treating RN: 11-21-1945 (76 y.o. [name] Primary Care David Rodriquez: Billey Gosling Other Clinician: Referring Terresa Marlett: Treating Kaelie Henigan/Extender: [name] in Treatment: 4 Wound Status Wound Number: 1 Primary Diabetic Wound/Ulcer of the Lower Extremity Etiology: Wound Location: Right, Lateral Lower Leg Wound Open Wounding Event: Gradually Appeared Status: Date Acquired: 08/15/2021 Comorbid Cataracts, Sleep Apnea, Congestive Heart Failure, Hypertension, Weeks Of Treatment: 4 History: Myocardial Infarction, Type II Diabetes, Gout Clustered Wound: No Photos Wound Measurements Length: (cm) 11.8 Width: (cm)  9.3 Depth: (cm) 0.2 Area: (cm) 86.19 Volume: (cm) 17.238 % Reduction in Area: -299.1% % Reduction in Volume: -698.1% Epithelialization: Small (1-33%) Wound Description Classification: Grade 2 Wound Margin: Distinct, outline attached Exudate Amount: None Present Foul Odor After Cleansing: No Slough/Fibrino Yes Wound Bed Granulation Amount: None Present (0%) Exposed Structure Necrotic Amount: Large (67-100%) Fascia Exposed: No Necrotic Quality: Eschar, Adherent Slough Fat Layer (Subcutaneous Tissue) Exposed: Yes Tendon Exposed: No Muscle Exposed: No Joint Exposed: No Bone Exposed: No Treatment Notes Wound #1 (Lower Leg) Wound Laterality: Right, Lateral Cleanser Soap and Water Discharge Instruction: May shower and wash wound with dial antibacterial soap and water prior to dressing change. Wound Cleanser Discharge Instruction: Cleanse the wound with wound cleanser prior to applying a clean dressing using gauze sponges, not tissue or cotton balls. Peri-Wound Care Topical Primary Dressing Iodosorb Gel 10 (gm) Tube Discharge Instruction: or iodoflex over keystone topical antibiotics Compounding T opical antibiotics Discharge Instruction: apply the [facility] compounding topical antibiotics directly to wound bed. Secondary Dressing ABD Pad, 8x10 Discharge Instruction: Apply over primary dressing as directed. CarboFLEX Odor Control Dressing, 4x4 in Discharge Instruction: Apply over primary dressing as directed. Woven Gauze Sponge, Non-Sterile 4x4 in Discharge Instruction: Apply over primary dressing as directed. Zetuvit Plus 4x8 in Discharge Instruction: Apply over primary dressing as directed. Secured With Compression Wrap Kerlix Roll 4.5x3.1 (in/yd) Discharge Instruction: Apply Kerlix and Coban compression as directed. Coban Self-Adherent Wrap 4x5 (in/yd) Discharge Instruction: Apply over Kerlix as directed. Compression Stockings Add-Ons Electronic  Signature(s) Signed: 12/05/2021 2:10:28 PM By: Valeria Batman EMT Signed: 12/05/2021 4:56:39 PM By: Deon Pilling RN, BSN Entered By: Valeria Batman on 12/05/2021 14:10:28 -------------------------------------------------------------------------------- Vitals Details Patient Name: Date of Service: [name], [name] 12/05/2021 11:00 A M Medical Record Number: [id number] Patient Account Number: [id number] Date of Birth/Sex: Treating RN:  April 10, 1946 (76 y.o. [name] Primary Care Zade Falkner: Billey Gosling Other Clinician: Referring Clemma Johnsen: Treating Alexander Aument/Extender: [name] in Treatment: 4 Vital Signs Time Taken: 10:43 Temperature (F): 97.5 Height (in): 66 Pulse (bpm): 46 Weight (lbs): 145 Respiratory Rate (breaths/min): 18 Body Mass Index (BMI): 23.4 Blood Pressure (mmHg): 150/87 Reference Range: 80 - 120 mg / dl Electronic Signature(s) Signed: 12/05/2021 3:30:20 PM By: Erenest Blank Entered By: Erenest Blank on 12/05/2021 10:43:43

## 2021-12-06 ENCOUNTER — Telehealth: Payer: Self-pay | Admitting: Internal Medicine

## 2021-12-06 NOTE — Telephone Encounter (Signed)
Pt has been discharged from [facility] PT 1 visit early. Goals met.   Pt does report w/ significant long walking, SOB. Unable to verify O2 levels (quarter of mile and greater)

## 2021-12-06 NOTE — Telephone Encounter (Signed)
Noted  

## 2021-12-11 ENCOUNTER — Other Ambulatory Visit: Payer: Self-pay | Admitting: Internal Medicine

## 2021-12-12 ENCOUNTER — Encounter (HOSPITAL_BASED_OUTPATIENT_CLINIC_OR_DEPARTMENT_OTHER): Payer: Medicare HMO | Admitting: Internal Medicine

## 2021-12-12 DIAGNOSIS — E1122 Type 2 diabetes mellitus with diabetic chronic kidney disease: Secondary | ICD-10-CM | POA: Diagnosis not present

## 2021-12-12 DIAGNOSIS — I5042 Chronic combined systolic (congestive) and diastolic (congestive) heart failure: Secondary | ICD-10-CM | POA: Diagnosis not present

## 2021-12-12 DIAGNOSIS — I5020 Unspecified systolic (congestive) heart failure: Secondary | ICD-10-CM

## 2021-12-12 DIAGNOSIS — R6889 Other general symptoms and signs: Secondary | ICD-10-CM | POA: Diagnosis not present

## 2021-12-12 DIAGNOSIS — E11622 Type 2 diabetes mellitus with other skin ulcer: Secondary | ICD-10-CM | POA: Diagnosis not present

## 2021-12-12 DIAGNOSIS — I25119 Atherosclerotic heart disease of native coronary artery with unspecified angina pectoris: Secondary | ICD-10-CM | POA: Diagnosis not present

## 2021-12-12 DIAGNOSIS — L97918 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 with other specified severity: Secondary | ICD-10-CM

## 2021-12-12 DIAGNOSIS — N184 Chronic kidney disease, stage 4 (severe): Secondary | ICD-10-CM | POA: Diagnosis not present

## 2021-12-12 DIAGNOSIS — I130 Hypertensive heart and chronic kidney disease with heart failure and stage 1 through stage 4 chronic kidney disease, or unspecified chronic kidney disease: Secondary | ICD-10-CM | POA: Diagnosis not present

## 2021-12-12 NOTE — Progress Notes (Signed)
YANG, RACK ([id number]) Visit Report for 12/12/2021 Chief Complaint Document Details Patient Name: Date of Service: Corey [name]. 12/12/2021 11:15 A M Medical Record Number: [id number] Patient Account Number: [id number] Date of Birth/Sex: Treating RN: [birth year] (76 y.o. Corey [name] Primary Care Provider: Billey Gosling Other Clinician: Referring Provider: Treating Provider/Extender: [name] in Treatment: 5 Information Obtained from: Patient Chief Complaint 11/01/2021; right lower extremity wound Electronic Signature(s) Signed: 12/12/2021 11:43:19 AM By: Kalman Shan DO Entered By: Kalman Shan on 12/12/2021 11:27:37 -------------------------------------------------------------------------------- Debridement Details Patient Name: Date of Service: Corey [name], [name] 12/12/2021 11:15 A M Medical Record Number: [id number] Patient Account Number: [id number] Date of Birth/Sex: Treating RN: 1945-11-16 (76 y.o. Corey [name] Primary Care Provider: Billey Gosling Other Clinician: Referring Provider: Treating Provider/Extender: Mickle Asper in Treatment: 5 Debridement Performed for Assessment: Wound #1 Right,Lateral Lower Leg Performed By: Physician Kalman Shan, DO Debridement Type: Debridement Severity of Tissue Pre Debridement: Fat layer exposed Level of Consciousness (Pre-procedure): Awake and Alert Pre-procedure Verification/Time Out Yes - 11:00 Taken: Start Time: 11:01 Pain Control: Lidocaine 5% topical ointment T Area Debrided (L x W): otal 14.5 (cm) x 9.3 (cm) = 134.85 (cm) Tissue and other material debrided: Viable, Non-Viable, Eschar, Slough, Subcutaneous, Slough Level: Skin/Subcutaneous Tissue Debridement Description: Excisional Instrument: Blade, Curette, Forceps, Scissors Bleeding: Minimum Hemostasis Achieved: Pressure End Time: 11:10 Procedural Pain: 0 Post Procedural Pain: 2 Response to  Treatment: Procedure was tolerated well Level of Consciousness (Post- Awake and Alert procedure): Post Debridement Measurements of Total Wound Length: (cm) 14.5 Width: (cm) 9.3 Depth: (cm) 0.2 Volume: (cm) 21.182 Character of Wound/Ulcer Post Debridement: Requires Further Debridement Severity of Tissue Post Debridement: Fat layer exposed Post Procedure Diagnosis Same as Pre-procedure Electronic Signature(s) Signed: 12/12/2021 11:43:19 AM By: Kalman Shan DO Signed: 12/12/2021 5:30:04 PM By: Deon Pilling RN, BSN Entered By: Deon Pilling on 12/12/2021 11:10:58 -------------------------------------------------------------------------------- HPI Details Patient Name: Date of Service: Corey [name], [name] 12/12/2021 11:15 A M Medical Record Number: [id number] Patient Account Number: [id number] Date of Birth/Sex: Treating RN: 1945/11/16 (76 y.o. Corey [name] Primary Care Provider: Billey Gosling Other Clinician: Referring Provider: Treating Provider/Extender: [name] in Treatment: 5 History of Present Illness HPI Description: Admission 11/01/2021 [name] Is a 76 year old male with a past medical history of of type 2 diabetes, coronary artery disease, stage IV kidney disease acute on chronic combined systolic and diastolic heart failure that presents to the clinic for a 10-monthhistory of right lower extremity wound. He states he is not sure how this started. It is completely eschared. He reports his wound has been stable for the past month. He has been keeping the area covered. He has mild chronic pain. He denies signs of infection. He states he has a history of lower extremity ulcers last being on the left leg. He could not tell me how this was treated in the past. 6/27; patient presents for follow-up. He is scheduled for his lower extremity arterial studies on July 6th. He has been using Medihoney to the wound bed. He has no issues or complaints  today. He denies signs of infection. 7/6; patient presents for follow-up. He has home health that reported the wound having odor and increased pain last week. He was sent to urgent care. He was started on doxycycline. He is continuing Medihoney. He has his scheduled ABIs w/TBIs today. 7/10; patient presents for follow-up. He had  his arterial studies done last week. He had triphasic waveforms throughout the right lower extremity. His TBI was 0.64. He has good pedal pulses. He is almost done with his course of antibiotics. He has been using mupirocin ointment to the wound bed. 7/17; patient presents for follow-up. He has been using Iodosorb under compression therapy. He has no issues or complaints today. He states he is receiving [facility] antibiotic tomorrow In the mail. 7/24; patient presents for follow-up. He has been doing Dakin's wet-to-dry dressings to the wound bed daily without issues. He currently denies signs of infection. He reports completing his course of doxycycline. He has his [facility] antibiotic with him today. 7/31; patient presents for follow-up. We have been using Iodoflex with [facility] under compression therapy. He has no issues or complaints today. He denies signs of infection. Electronic Signature(s) Signed: 12/12/2021 11:43:19 AM By: Kalman Shan DO Entered By: Kalman Shan on 12/12/2021 11:28:04 -------------------------------------------------------------------------------- Physical Exam Details Patient Name: Date of Service: Corey [name], [name] 12/12/2021 11:15 A M Medical Record Number: [id number] Patient Account Number: [id number] Date of Birth/Sex: Treating RN: [birth year] (76 y.o. Corey [name] Primary Care Provider: Billey Gosling Other Clinician: Referring Provider: Treating Provider/Extender: [name] in Treatment: 5 Constitutional respirations regular, non-labored and within target range for patient.. Cardiovascular 2+ dorsalis  pedis/posterior tibialis pulses. Psychiatric pleasant and cooperative. Notes Right lower extremity: T the anterior aspect there is granulation tissue, nonviable tissue and tightly adhered necrotic tissue. No signs of surrounding infection. o Electronic Signature(s) Signed: 12/12/2021 11:43:19 AM By: Kalman Shan DO Entered By: Kalman Shan on 12/12/2021 11:28:44 -------------------------------------------------------------------------------- Physician Orders Details Patient Name: Date of Service: Corey [name], [name] 12/12/2021 11:15 A M Medical Record Number: [id number] Patient Account Number: [id number] Date of Birth/Sex: Treating RN: 03/19/1946 (76 y.o. Corey [name] Primary Care Provider: Billey Gosling Other Clinician: Referring Provider: Treating Provider/Extender: Mickle Asper in Treatment: 5 Verbal / Phone Orders: No Diagnosis Coding ICD-10 Coding Code Description L87.564 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 with other specified severity E11.622 Type 2 diabetes mellitus with other skin ulcer I50.42 Chronic combined systolic (congestive) and diastolic (congestive) heart failure I25.119 Atherosclerotic heart disease of native coronary artery with unspecified angina pectoris Follow-up Appointments ppointment in 1 week. - Dr. Heber [place] and [place], Room 6 overflow Monday 1045 12/19/2021 Return A ppointment in 2 weeks. - Dr. Heber [place] and [place], Room 6 overflow Monday 1115 12/26/2021 Return A Other: - ****[facility] topical compounding antibiotics bring to appt each time.**** Bathing/ Shower/ Hygiene May shower with protection but do not get wound dressing(s) wet. Edema Control - Lymphedema / SCD / Other Elevate legs to the level of the heart or above for 30 minutes daily and/or when sitting, a frequency of: - 3-4 times a day throughout the day. Avoid standing for long periods of time. Home Health New wound care orders  this week; continue Home Health for wound care. May utilize formulary equivalent dressing for wound treatment orders unless otherwise specified. - home health to twice a week. Compounding topical antibiotics directly to wound bed and then apply Iodosorb. Other Home Health Orders/Instructions: - [facility] Wound Treatment Wound #1 - Lower Leg Wound Laterality: Right, Lateral Cleanser: Soap and Water (Home Health) 3 x Per Week/30 Days Discharge Instructions: May shower and wash wound with dial antibacterial soap and water prior to dressing change. Cleanser: Wound Cleanser (Home Health) 3 x Per Week/30 Days Discharge  Instructions: Cleanse the wound with wound cleanser prior to applying a clean dressing using gauze sponges, not tissue or cotton balls. Prim Dressing: Iodosorb Gel 10 (gm) Tube (Home Health) 3 x Per Week/30 Days ary Discharge Instructions: or iodoflex over [facility] topical antibiotics Prim Dressing: Compounding T ary opical antibiotics (Home Health) 3 x Per Week/30 Days Discharge Instructions: apply the [facility] compounding topical antibiotics directly to wound bed. Secondary Dressing: ABD Pad, 8x10 (Home Health) 3 x Per Week/30 Days Discharge Instructions: Apply over primary dressing as directed. Secondary Dressing: CarboFLEX Odor Control Dressing, 4x4 in 3 x Per Week/30 Days Discharge Instructions: Apply over primary dressing as directed. Secondary Dressing: Woven Gauze Sponge, Non-Sterile 4x4 in (Home Health) 3 x Per Week/30 Days Discharge Instructions: Apply over primary dressing as directed. Secondary Dressing: Zetuvit Plus 4x8 in (Home Health) 3 x Per Week/30 Days Discharge Instructions: Apply over primary dressing as directed. Compression Wrap: Kerlix Roll 4.5x3.1 (in/yd) (Home Health) 3 x Per Week/30 Days Discharge Instructions: Apply Kerlix and Coban compression as directed. Compression Wrap: Coban Self-Adherent Wrap 4x5 (in/yd) (Home Health) 3 x Per  Week/30 Days Discharge Instructions: Apply over Kerlix as directed. Ensure you wrap from base of toes to upper portion of lower leg just below the knee. Electronic Signature(s) Signed: 12/12/2021 11:43:19 AM By: Kalman Shan DO Entered By: Kalman Shan on 12/12/2021 11:28:51 -------------------------------------------------------------------------------- Problem List Details Patient Name: Date of Service: Corey [name], [name] 12/12/2021 11:15 A M Medical Record Number: [id number] Patient Account Number: [id number] Date of Birth/Sex: Treating RN: [birth year] (76 y.o. Corey [name] Primary Care Provider: Billey Gosling Other Clinician: Referring Provider: Treating Provider/Extender: Mickle Asper in Treatment: 5 Active Problems ICD-10 Encounter Code Description Active Date MDM Diagnosis L97.918 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 with other 11/01/2021 No Yes specified severity E11.622 Type 2 diabetes mellitus with other skin ulcer 11/01/2021 No Yes I50.42 Chronic combined systolic (congestive) and diastolic (congestive) heart failure 11/01/2021 No Yes I25.119 Atherosclerotic heart disease of native coronary artery with unspecified angina 11/01/2021 No Yes pectoris Inactive Problems Resolved Problems Electronic Signature(s) Signed: 12/12/2021 11:43:19 AM By: Kalman Shan DO Entered By: Kalman Shan on 12/12/2021 11:27:28 -------------------------------------------------------------------------------- Progress Note Details Patient Name: Date of Service: Corey [name], [name] 12/12/2021 11:15 A M Medical Record Number: [id number] Patient Account Number: [id number] Date of Birth/Sex: Treating RN: 02-13-1946 (76 y.o. Corey [name] Primary Care Provider: Billey Gosling Other Clinician: Referring Provider: Treating Provider/Extender: Mickle Asper in Treatment: 5 Subjective Chief Complaint Information  obtained from Patient 11/01/2021; right lower extremity wound History of Present Illness (HPI) Admission 11/01/2021 [name] Is a 76 year old male with a past medical history of of type 2 diabetes, coronary artery disease, stage IV kidney disease acute on chronic combined systolic and diastolic heart failure that presents to the clinic for a 3-monthhistory of right lower extremity wound. He states he is not sure how this started. It is completely eschared. He reports his wound has been stable for the past month. He has been keeping the area covered. He has mild chronic pain. He denies signs of infection. He states he has a history of lower extremity ulcers last being on the left leg. He could not tell me how this was treated in the past. 6/27; patient presents for follow-up. He is scheduled for his lower extremity arterial studies on July 6th. He has been using Medihoney to the wound bed. He  has no issues or complaints today. He denies signs of infection. 7/6; patient presents for follow-up. He has home health that reported the wound having odor and increased pain last week. He was sent to urgent care. He was started on doxycycline. He is continuing Medihoney. He has his scheduled ABIs w/TBIs today. 7/10; patient presents for follow-up. He had his arterial studies done last week. He had triphasic waveforms throughout the right lower extremity. His TBI was 0.64. He has good pedal pulses. He is almost done with his course of antibiotics. He has been using mupirocin ointment to the wound bed. 7/17; patient presents for follow-up. He has been using Iodosorb under compression therapy. He has no issues or complaints today. He states he is receiving [facility] antibiotic tomorrow In the mail. 7/24; patient presents for follow-up. He has been doing Dakin's wet-to-dry dressings to the wound bed daily without issues. He currently denies signs of infection. He reports completing his course of doxycycline.  He has his [facility] antibiotic with him today. 7/31; patient presents for follow-up. We have been using Iodoflex with [facility] under compression therapy. He has no issues or complaints today. He denies signs of infection. Patient History Family History Cancer - Father, Diabetes - Mother,Father, Heart Disease - Mother,Siblings, Hypertension - Mother,Father. Social History Former smoker - quit 1970, Marital Status - Married, Drug Use - No History, Caffeine Use - Moderate - coffee. Medical History Eyes Patient has history of Cataracts Respiratory Patient has history of Sleep Apnea - has CPAP does not wear Cardiovascular Patient has history of Congestive Heart Failure, Hypertension, Myocardial Infarction - 2014 Endocrine Patient has history of Type II Diabetes Musculoskeletal Patient has history of Gout Neurologic Denies history of Seizure Disorder Hospitalization/Surgery History - 06/2021 inguinal hernia repair. - cardioversion 09/2020. - ventral hernia repair 3/19. 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 Vitals Time Taken: 10:35 AM, Height: 66 in, Weight: 145 lbs, BMI: 23.4, Temperature: 97.8 F, Pulse: 47 bpm, Respiratory Rate: 18 breaths/min, Blood Pressure: 153/87 mmHg. Cardiovascular 2+ dorsalis pedis/posterior tibialis pulses. Psychiatric pleasant and cooperative. General Notes: Right lower extremity: T the anterior aspect there is granulation tissue, nonviable tissue and tightly adhered necrotic tissue. No signs of o surrounding infection. Integumentary (Hair, Skin) Wound #1 status is Open. Original cause of wound was Gradually Appeared. The date acquired was: 08/15/2021. The wound has been in treatment 5 weeks.  The wound is located on the Right,Lateral Lower Leg. The wound measures 14.5cm length x 9.3cm width x 0.2cm depth; 105.911cm^2 area and 21.182cm^3 volume. There is Fat Layer (Subcutaneous Tissue) exposed. There is no tunneling or undermining noted. There is a none present amount of drainage noted. The wound margin is distinct with the outline attached to the wound base. There is no granulation within the wound bed. There is a large (67-100%) amount of necrotic tissue within the wound bed including Eschar and Adherent Slough. 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 Type 2 diabetes mellitus with other skin ulcer Chronic combined systolic (congestive) and diastolic (congestive) heart failure Atherosclerotic heart disease of native coronary artery with unspecified angina pectoris Patient's wound has more granulation tissue present. I was able to debride More nonviable tissue Compared to other clinic visits. I recommended continuing the course with Iodoflex and [facility] antibiotic  under Kerlix/Coban. Procedures Wound #1 Pre-procedure diagnosis of Wound #1 is a Diabetic Wound/Ulcer of the Lower Extremity located on the Right,Lateral Lower Leg .Severity of Tissue Pre Debridement is: Fat layer exposed. There was a Excisional Skin/Subcutaneous Tissue Debridement with a total area of 134.85 sq cm performed by Kalman Shan, DO. With the following instrument(s): Blade, Curette, Forceps, and Scissors to remove Viable and Non-Viable tissue/material. Material removed includes Eschar, Subcutaneous Tissue, and Slough after achieving pain control using Lidocaine 5% topical ointment. A time out was conducted at 11:00, prior to the start of the procedure. A Minimum amount of bleeding was controlled with Pressure. The procedure was tolerated well with a pain level of 0 throughout and a pain level of 2 following the  procedure. Post Debridement Measurements: 14.5cm length x 9.3cm width x 0.2cm depth; 21.182cm^3 volume. Character of Wound/Ulcer Post Debridement requires further debridement. Severity of Tissue Post Debridement is: Fat layer exposed. Post procedure Diagnosis Wound #1: Same as Pre-Procedure Plan Follow-up Appointments: Return Appointment in 1 week. - Dr. Heber [place] and [place], Room 6 overflow Monday 1045 12/19/2021 Return Appointment in 2 weeks. - Dr. Heber [place] and [place], Room 6 overflow Monday 1115 12/26/2021 Other: - ****[facility] topical compounding antibiotics bring to appt each time.**** Bathing/ Shower/ Hygiene: May shower with protection but do not get wound dressing(s) wet. Edema Control - Lymphedema / SCD / Other: Elevate legs to the level of the heart or above for 30 minutes daily and/or when sitting, a frequency of: - 3-4 times a day throughout the day. Avoid standing for long periods of time. Home Health: New wound care orders this week; continue Home Health for wound care. May utilize formulary equivalent dressing for wound treatment orders unless otherwise specified. - home health to twice a week. Compounding topical antibiotics directly to wound bed and then apply Iodosorb. Other Home Health Orders/Instructions: - [facility] WOUND #1: - Lower Leg Wound Laterality: Right, Lateral Cleanser: Soap and Water (Home Health) 3 x Per Week/30 Days Discharge Instructions: May shower and wash wound with dial antibacterial soap and water prior to dressing change. Cleanser: Wound Cleanser (Home Health) 3 x Per Week/30 Days Discharge Instructions: Cleanse the wound with wound cleanser prior to applying a clean dressing using gauze sponges, not tissue or cotton balls. Prim Dressing: Iodosorb Gel 10 (gm) Tube (Home Health) 3 x Per Week/30 Days ary Discharge Instructions: or iodoflex over [facility] topical antibiotics Prim Dressing: Compounding T ary opical antibiotics (Home Health) 3 x  Per Week/30 Days Discharge Instructions: apply the [facility] compounding topical antibiotics directly to wound bed. Secondary Dressing: ABD Pad, 8x10 (Home Health) 3 x Per Week/30 Days Discharge Instructions: Apply over primary dressing as directed. Secondary Dressing: CarboFLEX Odor Control Dressing, 4x4 in 3 x Per Week/30 Days Discharge Instructions: Apply over primary dressing as directed. Secondary Dressing: Woven Gauze Sponge, Non-Sterile 4x4 in (Home Health) 3 x Per Week/30 Days Discharge Instructions: Apply over primary dressing as directed. Secondary Dressing: Zetuvit Plus 4x8 in (Home Health) 3 x Per Week/30 Days Discharge Instructions: Apply over primary dressing as directed. Com pression Wrap: Kerlix Roll 4.5x3.1 (in/yd) (Home Health) 3 x Per Week/30 Days Discharge Instructions: Apply Kerlix and Coban compression as directed. Com pression Wrap: Coban Self-Adherent Wrap 4x5 (in/yd) (Home Health) 3 x Per Week/30 Days Discharge Instructions: Apply over Kerlix as directed. Ensure you wrap from base of toes to upper portion of lower leg just below the knee. 1. In office sharp debridement 2. Iodoflex with [facility]  antibiotic under Kerlix/Coban 3. Follow-up in 1 week Electronic Signature(s) Signed: 12/12/2021 11:43:19 AM By: Kalman Shan DO Entered By: Kalman Shan on 12/12/2021 11:29:58 -------------------------------------------------------------------------------- HxROS Details Patient Name: Date of Service: Corey [name], [name] 12/12/2021 11:15 A M Medical Record Number: [id number] Patient Account Number: [id number] Date of Birth/Sex: Treating RN: 04-24-1946 (76 y.o. Corey [name] Primary Care Provider: Billey Gosling Other Clinician: Referring Provider: Treating Provider/Extender: [name] in Treatment: 5 Constitutional Symptoms (General Health) Medical History: Past Medical History Notes: CVA 2014 bilateral hearing  loss Eyes Medical History: Positive for: Cataracts Respiratory Medical History: Positive for: Sleep Apnea - has CPAP does not wear Cardiovascular Medical History: Positive for: Congestive Heart Failure; Hypertension; Myocardial Infarction - 2014 Gastrointestinal Medical History: Past Medical History Notes: Diverticulosis Endocrine Medical History: Positive for: Type II Diabetes Time with diabetes: >20 years Treated with: Oral agents, Diet Blood sugar tested every day: Yes Tested : BID Genitourinary Medical History: Past Medical History Notes: Renal insufficiency 20/ Musculoskeletal Medical History: Positive for: Gout Neurologic Medical History: Negative for: Seizure Disorder Oncologic Medical History: Past Medical History Notes: Prostate Ca HBO Extended History Items Eyes: Cataracts Immunizations Pneumococcal Vaccine: Received Pneumococcal Vaccination: No Implantable Devices No devices added Hospitalization / Surgery History Type of Hospitalization/Surgery 06/2021 inguinal hernia repair cardioversion 09/2020 ventral hernia repair 3/19 Family and Social History Cancer: Yes - Father; Diabetes: Yes - Mother,Father; Heart Disease: Yes - Mother,Siblings; Hypertension: Yes - Mother,Father; Former smoker - quit 1970; Marital Status - Married; Drug Use: No History; Caffeine Use: Moderate - coffee; Financial Concerns: No; Food, Clothing or Shelter Needs: No; Support System Lacking: No; Transportation Concerns: No Electronic Signature(s) Signed: 12/12/2021 11:43:19 AM By: Kalman Shan DO Signed: 12/12/2021 5:30:04 PM By: Deon Pilling RN, BSN Entered By: Kalman Shan on 12/12/2021 11:28:08 -------------------------------------------------------------------------------- SuperBill Details Patient Name: Date of Service: Corey [name], [name] 12/12/2021 Medical Record Number: [id number] Patient Account Number: [id number] Date of Birth/Sex: Treating RN: Sep 11, 1945 (76  y.o. Corey [name] Primary Care Provider: Billey Gosling Other Clinician: Referring Provider: Treating Provider/Extender: [name] in Treatment: 5 Diagnosis Coding ICD-10 Codes Code Description [phone] 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 with other specified severity E11.622 Type 2 diabetes mellitus with other skin ulcer I50.42 Chronic combined systolic (congestive) and diastolic (congestive) heart failure I25.119 Atherosclerotic heart disease of native coronary artery with unspecified angina pectoris Facility Procedures CPT4 Code: [id number] Description: 40973 - DEB SUBQ TISSUE 20 SQ CM/< ICD-10 Diagnosis Description L97.918 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 with other spe Modifier: cified severity Quantity: 1 CPT4 Code: [id number] Description: 11045 - DEB SUBQ TISS EA ADDL 20CM ICD-10 Diagnosis Description L97.918 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 with other spe Modifier: cified severity Quantity: 6 Physician Procedures : CPT4 Code Description Modifier [id number] 11042 - WC PHYS SUBQ TISS 20 SQ CM ICD-10 Diagnosis Description L97.918 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 with other specified severity Quantity: 1 : [id number] - WC PHYS SUBQ TISS EA ADDL 20 CM ICD-10 Diagnosis Description 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

## 2021-12-12 NOTE — Progress Notes (Signed)
DELRICO, MINEHART ([id number]) Visit Report for 12/12/2021 Arrival Information Details Patient Name: Date of Service: Corey [name]. 12/12/2021 11:15 A M Medical Record Number: [id number] Patient Account Number: [id number] Date of Birth/Sex: Treating RN: [birth year] (76 y.o. [name] Primary Care Hayleen Clinkscales: Billey Gosling Other Clinician: Referring Emonee Winkowski: Treating Quenten Nawaz/Extender: Mickle Asper in Treatment: 5 Visit Information History Since Last Visit Added or deleted any medications: No Patient Arrived: Ambulatory Any new allergies or adverse reactions: No Arrival Time: 10:33 Had a fall or experienced change in No Accompanied By: self activities of daily living that may affect Transfer Assistance: None risk of falls: Patient Identification Verified: Yes Signs or symptoms of abuse/neglect since last visito No Secondary Verification Process Completed: Yes Hospitalized since last visit: No Patient Requires Transmission-Based No Implantable device outside of the clinic excluding No Precautions: cellular tissue based products placed in the center Patient Has Alerts: Yes since last visit: Patient Alerts: in clinic R Corey [name] Has Dressing in Place as Prescribed: No 11/17/2021 ABIs: both B[place] Chitto Has Compression in Place as Prescribed: No 11/17/2021 TBI: R0.64 L0.58 Pain Present Now: Yes Electronic Signature(s) Signed: 12/12/2021 4:28:59 PM By: Erenest Blank Entered By: Erenest Blank on 12/12/2021 10:51:08 -------------------------------------------------------------------------------- Encounter Discharge Information Details Patient Name: Date of Service: Corey [name], [name] 12/12/2021 11:15 A M Medical Record Number: [id number] Patient Account Number: [id number] Date of Birth/Sex: Treating RN: [birth year] (76 y.o. [name] Primary Care Zaydn Gutridge: Billey Gosling Other Clinician: Referring Clevon Khader: Treating Cooper Moroney/Extender: Mickle Asper in Treatment: 5 Encounter Discharge Information Items Post Procedure Vitals Discharge Condition: Stable Temperature (F): 97.8 Ambulatory Status: Ambulatory Pulse (bpm): 47 Discharge Destination: Home Respiratory Rate (breaths/min): 20 Transportation: Private Auto Blood Pressure (mmHg): 153/87 Accompanied By: self Schedule Follow-up Appointment: Yes Clinical Summary of Care: Electronic Signature(s) Signed: 12/12/2021 5:30:04 PM By: Deon Pilling RN, BSN Entered By: Deon Pilling on 12/12/2021 11:12:55 -------------------------------------------------------------------------------- Lower Extremity Assessment Details Patient Name: Date of Service: Corey [name], [name] 12/12/2021 11:15 A M Medical Record Number: [id number] Patient Account Number: [id number] Date of Birth/Sex: Treating RN: [birth year] (76 y.o. [name] Primary Care Jorden Mahl: Billey Gosling Other Clinician: Referring Taye Cato: Treating Harpreet Signore/Extender: [name] in Treatment: 5 Edema Assessment Assessed: [Left: No] [Right: No] Edema: [Left: N] [Right: o] Calf Left: Right: Point of Measurement: 33 cm From Medial Instep 36 cm Ankle Left: Right: Point of Measurement: 11 cm From Medial Instep 20.5 cm Electronic Signature(s) Signed: 12/12/2021 4:28:59 PM By: Erenest Blank Signed: 12/12/2021 5:30:04 PM By: Deon Pilling RN, BSN Entered By: Erenest Blank on 12/12/2021 10:51:50 -------------------------------------------------------------------------------- Multi Wound Chart Details Patient Name: Date of Service: Corey [name], [name] 12/12/2021 11:15 A M Medical Record Number: [id number] Patient Account Number: [id number] Date of Birth/Sex: Treating RN: 1946/04/17 (76 y.o. [name] Primary Care Luverna Degenhart: Billey Gosling Other Clinician: Referring Jya Hughston: Treating Maren Wiesen/Extender: [name] in Treatment: 5 Vital Signs Height(in):  66 Pulse(bpm): 64 Weight(lbs): 145 Blood Pressure(mmHg): 153/87 Body Mass Index(BMI): 23.4 Temperature(F): 97.8 Respiratory Rate(breaths/min): 18 Photos: [N/A:N/A] Right, Lateral Lower Leg N/A N/A Wound Location: Gradually Appeared N/A N/A Wounding Event: Diabetic Wound/Ulcer of the Lower N/A N/A Primary Etiology: Extremity Cataracts, Sleep Apnea, Congestive N/A N/A Comorbid History: Heart Failure, Hypertension, Myocardial Infarction, Type II Diabetes, Gout 08/15/2021 N/A N/A Date Acquired: 5 N/A N/A Weeks of Treatment: Open N/A N/A Wound Status: No N/A N/A Wound Recurrence: 14.5x9.3x0.2 N/A N/A  Measurements L x W x D (cm) 105.911 N/A N/A A (cm) : rea 21.182 N/A N/A Volume (cm) : -390.40% N/A N/A % Reduction in A rea: -880.60% N/A N/A % Reduction in Volume: Grade 2 N/A N/A Classification: None Present N/A N/A Exudate A mount: Distinct, outline attached N/A N/A Wound Margin: None Present (0%) N/A N/A Granulation A mount: Large (67-100%) N/A N/A Necrotic A mount: Eschar, Adherent Slough N/A N/A Necrotic Tissue: Fat Layer (Subcutaneous Tissue): Yes N/A N/A Exposed Structures: Fascia: No Tendon: No Muscle: No Joint: No Bone: No Small (1-33%) N/A N/A Epithelialization: Debridement - Excisional N/A N/A Debridement: Pre-procedure Verification/Time Out 11:00 N/A N/A Taken: Lidocaine 5% topical ointment N/A N/A Pain Control: Necrotic/Eschar, Subcutaneous, N/A N/A Tissue Debrided: Slough Skin/Subcutaneous Tissue N/A N/A Level: 134.85 N/A N/A Debridement A (sq cm): rea Blade, Curette, Forceps, Scissors N/A N/A Instrument: Minimum N/A N/A Bleeding: Pressure N/A N/A Hemostasis Achieved: 0 N/A N/A Procedural Pain: 2 N/A N/A Post Procedural Pain: Debridement Treatment Response: Procedure was tolerated well N/A N/A Post Debridement Measurements L x 14.5x9.3x0.2 N/A N/A W x D (cm) 21.182 N/A N/A Post Debridement Volume: (cm) Debridement N/A  N/A Procedures Performed: Treatment Notes Wound #1 (Lower Leg) Wound Laterality: Right, Lateral Cleanser Soap and Water Discharge Instruction: May shower and wash wound with dial antibacterial soap and water prior to dressing change. Wound Cleanser Discharge Instruction: Cleanse the wound with wound cleanser prior to applying a clean dressing using gauze sponges, not tissue or cotton balls. Peri-Wound Care Topical Primary Dressing Iodosorb Gel 10 (gm) Tube Discharge Instruction: or iodoflex over keystone topical antibiotics Compounding T opical antibiotics Discharge Instruction: apply the [facility] compounding topical antibiotics directly to wound bed. Secondary Dressing ABD Pad, 8x10 Discharge Instruction: Apply over primary dressing as directed. CarboFLEX Odor Control Dressing, 4x4 in Discharge Instruction: Apply over primary dressing as directed. Woven Gauze Sponge, Non-Sterile 4x4 in Discharge Instruction: Apply over primary dressing as directed. Zetuvit Plus 4x8 in Discharge Instruction: Apply over primary dressing as directed. Secured With Compression Wrap Kerlix Roll 4.5x3.1 (in/yd) Discharge Instruction: Apply Kerlix and Coban compression as directed. Coban Self-Adherent Wrap 4x5 (in/yd) Discharge Instruction: Apply over Kerlix as directed. Ensure you wrap from base of toes to upper portion of lower leg just below the knee. Compression Stockings Add-Ons Electronic Signature(s) Signed: 12/12/2021 11:43:19 AM By: Kalman Shan DO Signed: 12/12/2021 5:30:04 PM By: Deon Pilling RN, BSN Entered By: Kalman Shan on 12/12/2021 11:27:33 -------------------------------------------------------------------------------- Multi-Disciplinary Care Plan Details Patient Name: Date of Service: Corey [name], [name] 12/12/2021 11:15 A M Medical Record Number: [id number] Patient Account Number: [id number] Date of Birth/Sex: Treating RN: [birth year] (76 y.o. [name] Primary Care Kieon Lawhorn: Billey Gosling Other Clinician: Referring Tearia Gibbs: Treating Kaylen Motl/Extender: Mickle Asper in Treatment: 5 Active Inactive Necrotic Tissue Nursing Diagnoses: Knowledge deficit related to management of necrotic/devitalized tissue Goals: Necrotic/devitalized tissue will be minimized in the wound bed Date Initiated: 11/01/2021 Target Resolution Date: 01/13/2022 Goal Status: Active Patient/caregiver will verbalize understanding of reason and process for debridement of necrotic tissue Date Initiated: 11/01/2021 Date Inactivated: 12/05/2021 Target Resolution Date: 12/09/2021 Goal Status: Met Interventions: Assess patient pain level pre-, during and post procedure and prior to discharge Provide education on necrotic tissue and debridement process Treatment Activities: Apply topical anesthetic as ordered : 11/01/2021 Enzymatic debridement : 11/01/2021 Excisional debridement : 11/01/2021 Notes: Pain, Acute or Chronic Nursing Diagnoses: Pain, acute or chronic: actual or potential Potential alteration in comfort, pain Goals: Patient will  verbalize adequate pain control and receive pain control interventions during procedures as needed Date Initiated: 11/01/2021 Date Inactivated: 11/21/2021 Target Resolution Date: 12/09/2021 Goal Status: Met Patient/caregiver will verbalize comfort level met Date Initiated: 11/01/2021 Target Resolution Date: 01/05/2022 Goal Status: Active Interventions: Complete pain assessment as per visit requirements Provide education on pain management Treatment Activities: Administer pain control measures as ordered : 11/01/2021 Notes: Electronic Signature(s) Signed: 12/12/2021 5:30:04 PM By: Deon Pilling RN, BSN Entered By: Deon Pilling on 12/12/2021 10:58:35 -------------------------------------------------------------------------------- Pain Assessment Details Patient Name: Date of Service: Corey [name], [name]  12/12/2021 11:15 A M Medical Record Number: [id number] Patient Account Number: [id number] Date of Birth/Sex: Treating RN: 07/02/1945 (76 y.o. [name] Primary Care Lilliah Priego: Billey Gosling Other Clinician: Referring Lionardo Haze: Treating Demisha Nokes/Extender: [name] in Treatment: 5 Active Problems Location of Pain Severity and Description of Pain Patient Has Paino Yes Site Locations Pain Location: Pain in Ulcers Rate the pain. Current Pain Level: 8 Pain Management and Medication Current Pain Management: Electronic Signature(s) Signed: 12/12/2021 4:28:59 PM By: Erenest Blank Signed: 12/12/2021 5:30:04 PM By: Deon Pilling RN, BSN Entered By: Erenest Blank on 12/12/2021 10:36:19 -------------------------------------------------------------------------------- Patient/Caregiver Education Details Patient Name: Date of Service: Corey [name] 7/31/2023andnbsp11:15 [place] Record Number: [id number] Patient Account Number: [id number] Date of Birth/Gender: Treating RN: 01-29-1946 (76 y.o. [name] Primary Care Physician: Billey Gosling Other Clinician: Referring Physician: Treating Physician/Extender: Mickle Asper in Treatment: 5 Education Assessment Education Provided To: Patient Education Topics Provided Wound Debridement: Handouts: Wound Debridement Methods: Explain/Verbal Responses: Reinforcements needed Electronic Signature(s) Signed: 12/12/2021 5:30:04 PM By: Deon Pilling RN, BSN Entered By: Deon Pilling on 12/12/2021 10:59:33 -------------------------------------------------------------------------------- Wound Assessment Details Patient Name: Date of Service: Corey [name], [name] 12/12/2021 11:15 A M Medical Record Number: [id number] Patient Account Number: [id number] Date of Birth/Sex: Treating RN: Mar 11, 1946 (76 y.o. [name] Primary Care Daoud Lobue: Billey Gosling Other Clinician: Referring  Faye Sanfilippo: Treating Nickolette Espinola/Extender: [name] in Treatment: 5 Wound Status Wound Number: 1 Primary Diabetic Wound/Ulcer of the Lower Extremity Etiology: Wound Location: Right, Lateral Lower Leg Wound Open Wounding Event: Gradually Appeared Status: Date Acquired: 08/15/2021 Comorbid Cataracts, Sleep Apnea, Congestive Heart Failure, Hypertension, Weeks Of Treatment: 5 History: Myocardial Infarction, Type II Diabetes, Gout Clustered Wound: No Photos Wound Measurements Length: (cm) 14.5 Width: (cm) 9.3 Depth: (cm) 0.2 Area: (cm) 105.911 Volume: (cm) 21.182 % Reduction in Area: -390.4% % Reduction in Volume: -880.6% Epithelialization: Small (1-33%) Tunneling: No Undermining: No Wound Description Classification: Grade 2 Wound Margin: Distinct, outline attached Exudate Amount: None Present Foul Odor After Cleansing: No Slough/Fibrino Yes Wound Bed Granulation Amount: None Present (0%) Exposed Structure Necrotic Amount: Large (67-100%) Fascia Exposed: No Necrotic Quality: Eschar, Adherent Slough Fat Layer (Subcutaneous Tissue) Exposed: Yes Tendon Exposed: No Muscle Exposed: No Joint Exposed: No Bone Exposed: No Treatment Notes Wound #1 (Lower Leg) Wound Laterality: Right, Lateral Cleanser Soap and Water Discharge Instruction: May shower and wash wound with dial antibacterial soap and water prior to dressing change. Wound Cleanser Discharge Instruction: Cleanse the wound with wound cleanser prior to applying a clean dressing using gauze sponges, not tissue or cotton balls. Peri-Wound Care Topical Primary Dressing Iodosorb Gel 10 (gm) Tube Discharge Instruction: or iodoflex over keystone topical antibiotics Compounding T opical antibiotics Discharge Instruction: apply the [facility] compounding topical antibiotics directly to wound bed. Secondary Dressing ABD Pad, 8x10 Discharge Instruction: Apply over primary dressing  as  directed. CarboFLEX Odor Control Dressing, 4x4 in Discharge Instruction: Apply over primary dressing as directed. Woven Gauze Sponge, Non-Sterile 4x4 in Discharge Instruction: Apply over primary dressing as directed. Zetuvit Plus 4x8 in Discharge Instruction: Apply over primary dressing as directed. Secured With Compression Wrap Kerlix Roll 4.5x3.1 (in/yd) Discharge Instruction: Apply Kerlix and Coban compression as directed. Coban Self-Adherent Wrap 4x5 (in/yd) Discharge Instruction: Apply over Kerlix as directed. Ensure you wrap from base of toes to upper portion of lower leg just below the knee. Compression Stockings Add-Ons Electronic Signature(s) Signed: 12/12/2021 4:28:59 PM By: Erenest Blank Signed: 12/12/2021 5:30:04 PM By: Deon Pilling RN, BSN Entered By: Erenest Blank on 12/12/2021 10:52:31 -------------------------------------------------------------------------------- Vitals Details Patient Name: Date of Service: Corey [name], [name] 12/12/2021 11:15 A M Medical Record Number: [id number] Patient Account Number: [id number] Date of Birth/Sex: Treating RN: 1945/11/23 (76 y.o. [name] Primary Care Carys Malina: Billey Gosling Other Clinician: Referring Jetty Berland: Treating Zayaan Kozak/Extender: [name] in Treatment: 5 Vital Signs Time Taken: 10:35 Temperature (F): 97.8 Height (in): 66 Pulse (bpm): 47 Weight (lbs): 145 Respiratory Rate (breaths/min): 18 Body Mass Index (BMI): 23.4 Blood Pressure (mmHg): 153/87 Reference Range: 80 - 120 mg / dl Electronic Signature(s) Signed: 12/12/2021 4:28:59 PM By: Erenest Blank Entered By: Erenest Blank on 12/12/2021 10:36:08

## 2021-12-19 ENCOUNTER — Encounter (HOSPITAL_BASED_OUTPATIENT_CLINIC_OR_DEPARTMENT_OTHER): Payer: Medicare HMO | Attending: Internal Medicine | Admitting: Internal Medicine

## 2021-12-19 DIAGNOSIS — I87311 Chronic venous hypertension (idiopathic) with ulcer of right lower extremity: Secondary | ICD-10-CM | POA: Insufficient documentation

## 2021-12-19 DIAGNOSIS — E11622 Type 2 diabetes mellitus with other skin ulcer: Secondary | ICD-10-CM | POA: Insufficient documentation

## 2021-12-19 DIAGNOSIS — L97918 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 with other specified severity: Secondary | ICD-10-CM | POA: Diagnosis not present

## 2021-12-19 DIAGNOSIS — I5042 Chronic combined systolic (congestive) and diastolic (congestive) heart failure: Secondary | ICD-10-CM | POA: Insufficient documentation

## 2021-12-19 DIAGNOSIS — I130 Hypertensive heart and chronic kidney disease with heart failure and stage 1 through stage 4 chronic kidney disease, or unspecified chronic kidney disease: Secondary | ICD-10-CM | POA: Diagnosis not present

## 2021-12-19 DIAGNOSIS — R6889 Other general symptoms and signs: Secondary | ICD-10-CM | POA: Diagnosis not present

## 2021-12-19 DIAGNOSIS — I25119 Atherosclerotic heart disease of native coronary artery with unspecified angina pectoris: Secondary | ICD-10-CM | POA: Diagnosis not present

## 2021-12-19 DIAGNOSIS — N184 Chronic kidney disease, stage 4 (severe): Secondary | ICD-10-CM | POA: Insufficient documentation

## 2021-12-19 NOTE — Progress Notes (Signed)
SAED, HUDLOW ([id number]) Visit Report for 12/19/2021 Arrival Information Details Patient Name: Date of Service: Corey [name] 12/19/2021 10:45 A M Medical Record Number: [id number] Patient Account Number: [id number] Date of Birth/Sex: Treating RN: 03/06/1946 (76 y.o. Corey [name] Primary Care Corey [name]: Corey [name] Other Clinician: Referring Corey [name]: Treating Corey [name]/Extender: Corey [name] in Treatment: 6 Visit Information History Since Last Visit Added or deleted any medications: No Patient Arrived: Ambulatory Any new allergies or adverse reactions: No Arrival Time: 10:26 Had a fall or experienced change in No Accompanied By: self activities of daily living that may affect Transfer Assistance: None risk of falls: Patient Identification Verified: Yes Signs or symptoms of abuse/neglect since last visito No Secondary Verification Process Completed: Yes Hospitalized since last visit: No Patient Requires Transmission-Based No Implantable device outside of the clinic excluding No Precautions: cellular tissue based products placed in the center Patient Has Alerts: Yes since last visit: Patient Alerts: in clinic R [place] Has Dressing in Place as Prescribed: Yes 11/17/2021 ABIs: both [place] Pain Present Now: Yes 11/17/2021 TBI: R0.64 L0.58 Electronic Signature(s) Signed: 12/19/2021 12:02:49 PM By: Corey [name] Entered By: Corey [name] on 12/19/2021 10:27:47 -------------------------------------------------------------------------------- Encounter Discharge Information Details Patient Name: Date of Service: Corey [name], [name] 12/19/2021 10:45 A M Medical Record Number: [id number] Patient Account Number: [id number] Date of Birth/Sex: Treating RN: 1945/11/29 (76 y.o. Corey [name] Primary Care Skyelar Swigart: Corey [name] Other Clinician: Referring Breckin Zafar: Treating Miran Kautzman/Extender: Corey [name] in Treatment: 6 Encounter Discharge  Information Items Discharge Condition: Stable Ambulatory Status: Ambulatory Discharge Destination: Home Transportation: Private Auto Accompanied By: self Schedule Follow-up Appointment: Yes Clinical Summary of Care: Electronic Signature(s) Signed: 12/19/2021 4:18:42 PM By: Corey Pilling RN, BSN Entered By: Corey [name] on 12/19/2021 11:07:32 -------------------------------------------------------------------------------- Lower Extremity Assessment Details Patient Name: Date of Service: Corey [name], [name] 12/19/2021 10:45 A M Medical Record Number: [id number] Patient Account Number: [id number] Date of Birth/Sex: Treating RN: 12-26-1945 (76 y.o. Corey [name] Primary Care Tenise Stetler: Corey [name] Other Clinician: Referring Tammi Boulier: Treating Climmie Cronce/Extender: Corey [name] Weeks in Treatment: 6 Edema Assessment Assessed: [Left: No] [Right: No] Edema: [Left: N] [Right: o] Calf Left: Right: Point of Measurement: 33 cm From Medial Instep 33.5 cm Ankle Left: Right: Point of Measurement: 11 cm From Medial Instep 22 cm Electronic Signature(s) Signed: 12/19/2021 12:02:49 PM By: Corey [name] Signed: 12/19/2021 4:18:42 PM By: Corey Pilling RN, BSN Entered By: Corey [name] on 12/19/2021 10:45:24 -------------------------------------------------------------------------------- Multi Wound Chart Details Patient Name: Date of Service: Corey [name], [name] 12/19/2021 10:45 A M Medical Record Number: [id number] Patient Account Number: [id number] Date of Birth/Sex: Treating RN: 1945-06-04 (76 y.o. Corey [name] Primary Care Aanya Haynes: Corey [name] Other Clinician: Referring Adalyn Pennock: Treating Junetta Hearn/Extender: Corey [name] Weeks in Treatment: 6 Vital Signs Height(in): 66 Pulse(bpm): 50 Weight(lbs): 145 Blood Pressure(mmHg): 174/91 Body Mass Index(BMI): 23.4 Temperature(F): 98.1 Respiratory Rate(breaths/min): 18 Photos: [N/A:N/A] Right, Lateral  Lower Leg N/A N/A Wound Location: Gradually Appeared N/A N/A Wounding Event: Diabetic Wound/Ulcer of the Lower N/A N/A Primary Etiology: Extremity Cataracts, Sleep Apnea, Congestive N/A N/A Comorbid History: Heart Failure, Hypertension, Myocardial Infarction, Type II Diabetes, Gout 08/15/2021 N/A N/A Date Acquired: 6 N/A N/A Weeks of Treatment: Open N/A N/A Wound Status: No N/A N/A Wound Recurrence: 15x9.2x0.2 N/A N/A Measurements L x W x D (cm) 108.385 N/A N/A A (cm) : rea 21.677 N/A N/A Volume (cm) : -401.80% N/A N/A %  Reduction in A rea: -903.60% N/A N/A % Reduction in Volume: Grade 2 N/A N/A Classification: None Present N/A N/A Exudate A mount: Distinct, outline attached N/A N/A Wound Margin: None Present (0%) N/A N/A Granulation A mount: Large (67-100%) N/A N/A Necrotic A mount: Eschar, Adherent Slough N/A N/A Necrotic Tissue: Fat Layer (Subcutaneous Tissue): Yes N/A N/A Exposed Structures: Fascia: No Tendon: No Muscle: No Joint: No Bone: No Small (1-33%) N/A N/A Epithelialization: Treatment Notes Wound #1 (Lower Leg) Wound Laterality: Right, Lateral Cleanser Soap and Water Discharge Instruction: May shower and wash wound with dial antibacterial soap and water prior to dressing change. Wound Cleanser Discharge Instruction: Cleanse the wound with wound cleanser prior to applying a clean dressing using gauze sponges, not tissue or cotton balls. Peri-Wound Care Topical Primary Dressing Hydrofera Blue Classic Foam, 4x4 in Discharge Instruction: Moisten with saline prior to apply OVER THE KEYSTONE. Compounding T opical antibiotics Discharge Instruction: apply the [facility] compounding topical antibiotics directly to wound bed. Secondary Dressing ABD Pad, 8x10 Discharge Instruction: Apply over primary dressing as directed. CarboFLEX Odor Control Dressing, 4x4 in Discharge Instruction: Apply over primary dressing as directed. Woven Gauze  Sponge, Non-Sterile 4x4 in Discharge Instruction: Apply over primary dressing as directed. Zetuvit Plus 4x8 in Discharge Instruction: Apply over primary dressing as directed. Secured With Compression Wrap Kerlix Roll 4.5x3.1 (in/yd) Discharge Instruction: Apply Kerlix and Coban compression as directed. Coban Self-Adherent Wrap 4x5 (in/yd) Discharge Instruction: Apply over Kerlix as directed. Ensure you wrap from base of toes to upper portion of lower leg just below the knee. Compression Stockings Add-Ons Electronic Signature(s) Signed: 12/19/2021 11:21:12 AM By: Kalman Shan DO Signed: 12/19/2021 4:18:42 PM By: Corey Pilling RN, BSN Entered By: Kalman Shan on 12/19/2021 11:15:05 -------------------------------------------------------------------------------- Multi-Disciplinary Care Plan Details Patient Name: Date of Service: Corey [name], [name] 12/19/2021 10:45 A M Medical Record Number: [id number] Patient Account Number: [id number] Date of Birth/Sex: Treating RN: [birth year] (76 y.o. Corey [name] Primary Care Ramey Schiff: Corey [name] Other Clinician: Referring Jammal Sarr: Treating Alba Kriesel/Extender: Corey [name] in Treatment: 6 Active Inactive Necrotic Tissue Nursing Diagnoses: Knowledge deficit related to management of necrotic/devitalized tissue Goals: Necrotic/devitalized tissue will be minimized in the wound bed Date Initiated: 11/01/2021 Target Resolution Date: 01/13/2022 Goal Status: Active Patient/caregiver will verbalize understanding of reason and process for debridement of necrotic tissue Date Initiated: 11/01/2021 Date Inactivated: 12/05/2021 Target Resolution Date: 12/09/2021 Goal Status: Met Interventions: Assess patient pain level pre-, during and post procedure and prior to discharge Provide education on necrotic tissue and debridement process Treatment Activities: Apply topical anesthetic as ordered : 11/01/2021 Enzymatic debridement :  11/01/2021 Excisional debridement : 11/01/2021 Notes: Pain, Acute or Chronic Nursing Diagnoses: Pain, acute or chronic: actual or potential Potential alteration in comfort, pain Goals: Patient will verbalize adequate pain control and receive pain control interventions during procedures as needed Date Initiated: 11/01/2021 Date Inactivated: 11/21/2021 Target Resolution Date: 12/09/2021 Goal Status: Met Patient/caregiver will verbalize comfort level met Date Initiated: 11/01/2021 Target Resolution Date: 01/05/2022 Goal Status: Active Interventions: Complete pain assessment as per visit requirements Provide education on pain management Treatment Activities: Administer pain control measures as ordered : 11/01/2021 Notes: Electronic Signature(s) Signed: 12/19/2021 4:18:42 PM By: Corey Pilling RN, BSN Entered By: Corey [name] on 12/19/2021 11:20:08 -------------------------------------------------------------------------------- Pain Assessment Details Patient Name: Date of Service: Corey [name], [name] 12/19/2021 10:45 A M Medical Record Number: [id number] Patient Account Number: [id number] Date of Birth/Sex: Treating RN: 1945/12/22 (76 y.o. M) Rolin Barry,  IRSWN Primary Care Yuma Blucher: Corey [name] Other Clinician: Referring Sunnie Odden: Treating Brennyn Ortlieb/Extender: Corey [name] in Treatment: 6 Active Problems Location of Pain Severity and Description of Pain Patient Has Paino Yes Site Locations Pain Location: Pain in Ulcers Rate the pain. Current Pain Level: 7 Pain Management and Medication Current Pain Management: Electronic Signature(s) Signed: 12/19/2021 12:02:49 PM By: Corey [name] Signed: 12/19/2021 4:18:42 PM By: Corey Pilling RN, BSN Entered By: Corey [name] on 12/19/2021 10:28:03 -------------------------------------------------------------------------------- Patient/Caregiver Education Details Patient Name: Date of Service: Corey [name]  8/7/2023andnbsp10:45 A M Medical Record Number: [id number] Patient Account Number: [id number] Date of Birth/Gender: Treating RN: 05/10/1946 (76 y.o. Corey [name] Primary Care Physician: Corey [name] Other Clinician: Referring Physician: Treating Physician/Extender: Corey [name] in Treatment: 6 Education Assessment Education Provided To: Patient Education Topics Provided Pain: Handouts: A Guide to Pain Control Methods: Explain/Verbal Responses: Reinforcements needed Electronic Signature(s) Signed: 12/19/2021 4:18:42 PM By: Corey Pilling RN, BSN Entered By: Corey [name] on 12/19/2021 10:42:43 -------------------------------------------------------------------------------- Wound Assessment Details Patient Name: Date of Service: Corey [name], [name] 12/19/2021 10:45 A M Medical Record Number: [id number] Patient Account Number: [id number] Date of Birth/Sex: Treating RN: 04-17-1946 (76 y.o. Corey [name] Primary Care Sabri Teal: Corey [name] Other Clinician: Referring Travonne Schowalter: Treating Anu Stagner/Extender: Corey [name] Weeks in Treatment: 6 Wound Status Wound Number: 1 Primary Diabetic Wound/Ulcer of the Lower Extremity Etiology: Wound Location: Right, Lateral Lower Leg Wound Open Wounding Event: Gradually Appeared Status: Date Acquired: 08/15/2021 Comorbid Cataracts, Sleep Apnea, Congestive Heart Failure, Hypertension, Weeks Of Treatment: 6 History: Myocardial Infarction, Type II Diabetes, Gout Clustered Wound: No Photos Wound Measurements Length: (cm) 15 Width: (cm) 9.2 Depth: (cm) 0.2 Area: (cm) 108.385 Volume: (cm) 21.677 % Reduction in Area: -401.8% % Reduction in Volume: -903.6% Epithelialization: Small (1-33%) Tunneling: No Undermining: No Wound Description Classification: Grade 2 Wound Margin: Distinct, outline attached Exudate Amount: None Present Foul Odor After Cleansing: No Slough/Fibrino Yes Wound  Bed Granulation Amount: None Present (0%) Exposed Structure Necrotic Amount: Large (67-100%) Fascia Exposed: No Necrotic Quality: Eschar, Adherent Slough Fat Layer (Subcutaneous Tissue) Exposed: Yes Tendon Exposed: No Muscle Exposed: No Joint Exposed: No Bone Exposed: No Treatment Notes Wound #1 (Lower Leg) Wound Laterality: Right, Lateral Cleanser Soap and Water Discharge Instruction: May shower and wash wound with dial antibacterial soap and water prior to dressing change. Wound Cleanser Discharge Instruction: Cleanse the wound with wound cleanser prior to applying a clean dressing using gauze sponges, not tissue or cotton balls. Peri-Wound Care Topical Primary Dressing Hydrofera Blue Classic Foam, 4x4 in Discharge Instruction: Moisten with saline prior to apply OVER THE KEYSTONE. Compounding T opical antibiotics Discharge Instruction: apply the [facility] compounding topical antibiotics directly to wound bed. Secondary Dressing ABD Pad, 8x10 Discharge Instruction: Apply over primary dressing as directed. CarboFLEX Odor Control Dressing, 4x4 in Discharge Instruction: Apply over primary dressing as directed. Woven Gauze Sponge, Non-Sterile 4x4 in Discharge Instruction: Apply over primary dressing as directed. Zetuvit Plus 4x8 in Discharge Instruction: Apply over primary dressing as directed. Secured With Compression Wrap Kerlix Roll 4.5x3.1 (in/yd) Discharge Instruction: Apply Kerlix and Coban compression as directed. Coban Self-Adherent Wrap 4x5 (in/yd) Discharge Instruction: Apply over Kerlix as directed. Ensure you wrap from base of toes to upper portion of lower leg just below the knee. Compression Stockings Add-Ons Electronic Signature(s) Signed: 12/19/2021 12:02:49 PM By: Corey [name] Signed: 12/19/2021 4:18:42 PM By: Corey Pilling RN, BSN Entered By: Corey [name] on 12/19/2021  10:46:38 -------------------------------------------------------------------------------- Vitals Details Patient Name: Date of Service: Corey [name]. 12/19/2021 10:45 A M Medical Record Number: [id number] Patient Account Number: [id number] Date of Birth/Sex: Treating RN: [birth year] (76 y.o. Corey [name] Primary Care Carolyn Maniscalco: Corey [name] Other Clinician: Referring Dennisse Swader: Treating Ailene Royal/Extender: Corey [name] Weeks in Treatment: 6 Vital Signs Time Taken: 10:28 Temperature (F): 98.1 Height (in): 66 Pulse (bpm): 50 Weight (lbs): 145 Respiratory Rate (breaths/min): 18 Body Mass Index (BMI): 23.4 Blood Pressure (mmHg): 174/91 Reference Range: 80 - 120 mg / dl Electronic Signature(s) Signed: 12/19/2021 12:02:49 PM By: Corey [name] Entered By: Corey [name] on 12/19/2021 10:28:34

## 2021-12-19 NOTE — Progress Notes (Signed)
Corey, [name] ([id number]) Visit Report for 12/19/2021 Chief Complaint Document Details Patient Name: Date of Service: Corey [name] 12/19/2021 10:45 A M Medical Record Number: [id number] Patient Account Number: [id number] Date of Birth/Sex: Treating RN: 06-04-1945 (76 y.o. [name] Primary Care Provider: Billey Gosling Other Clinician: Referring Provider: Treating Provider/Extender: [name] in Treatment: 6 Information Obtained from: Patient Chief Complaint 11/01/2021; right lower extremity wound Electronic Signature(s) Signed: 12/19/2021 11:21:12 AM By: Kalman Shan DO Entered By: Kalman Shan on 12/19/2021 11:15:56 -------------------------------------------------------------------------------- Debridement Details Patient Name: Date of Service: Corey [name], [name] 12/19/2021 10:45 A M Medical Record Number: [id number] Patient Account Number: [id number] Date of Birth/Sex: Treating RN: 07-14-1945 (76 y.o. [name] Primary Care Provider: Billey Gosling Other Clinician: Referring Provider: Treating Provider/Extender: Mickle Asper in Treatment: 6 Debridement Performed for Assessment: Wound #1 Right,Lateral Lower Leg Performed By: Physician Kalman Shan, DO Debridement Type: Debridement Severity of Tissue Pre Debridement: Fat layer exposed Level of Consciousness (Pre-procedure): Awake and Alert Pre-procedure Verification/Time Out Yes - 10:55 Taken: Start Time: 10:56 Pain Control: Lidocaine 5% topical ointment T Area Debrided (L x W): otal 12 (cm) x 7 (cm) = 84 (cm) Tissue and other material debrided: Viable, Non-Viable, Eschar, Slough, Subcutaneous, Slough Level: Skin/Subcutaneous Tissue Debridement Description: Excisional Instrument: Curette Bleeding: Minimum Hemostasis Achieved: Pressure End Time: 11:08 Procedural Pain: 0 Post Procedural Pain: 0 Response to Treatment: Procedure was tolerated well Level  of Consciousness (Post- Awake and Alert procedure): Post Debridement Measurements of Total Wound Length: (cm) 15 Width: (cm) 9.2 Depth: (cm) 0.2 Volume: (cm) 21.677 Character of Wound/Ulcer Post Debridement: Requires Further Debridement Severity of Tissue Post Debridement: Fat layer exposed Post Procedure Diagnosis Same as Pre-procedure Electronic Signature(s) Signed: 12/19/2021 11:21:12 AM By: Kalman Shan DO Signed: 12/19/2021 4:18:42 PM By: Deon Pilling RN, BSN Entered By: Deon Pilling on 12/19/2021 11:19:43 -------------------------------------------------------------------------------- HPI Details Patient Name: Date of Service: Corey [name], [name] 12/19/2021 10:45 A M Medical Record Number: [id number] Patient Account Number: [id number] Date of Birth/Sex: Treating RN: 10/19/1945 (76 y.o. [name] Primary Care Provider: Billey Gosling Other Clinician: Referring Provider: Treating Provider/Extender: Mickle Asper in Treatment: 6 History of Present Illness HPI Description: Admission 11/01/2021 [name] Is a 76 year old male with a past medical history of of type 2 diabetes, coronary artery disease, stage IV kidney disease acute on chronic combined systolic and diastolic heart failure that presents to the clinic for a 20-monthhistory of right lower extremity wound. He states he is not sure how this started. It is completely eschared. He reports his wound has been stable for the past month. He has been keeping the area covered. He has mild chronic pain. He denies signs of infection. He states he has a history of lower extremity ulcers last being on the left leg. He could not tell me how this was treated in the past. 6/27; patient presents for follow-up. He is scheduled for his lower extremity arterial studies on July 6th. He has been using Medihoney to the wound bed. He has no issues or complaints today. He denies signs of infection. 7/6; patient  presents for follow-up. He has home health that reported the wound having odor and increased pain last week. He was sent to urgent care. He was started on doxycycline. He is continuing Medihoney. He has his scheduled ABIs w/TBIs today. 7/10; patient presents for follow-up. He had his arterial studies  done last week. He had triphasic waveforms throughout the right lower extremity. His TBI was 0.64. He has good pedal pulses. He is almost done with his course of antibiotics. He has been using mupirocin ointment to the wound bed. 7/17; patient presents for follow-up. He has been using Iodosorb under compression therapy. He has no issues or complaints today. He states he is receiving [facility] antibiotic tomorrow In the mail. 7/24; patient presents for follow-up. He has been doing Dakin's wet-to-dry dressings to the wound bed daily without issues. He currently denies signs of infection. He reports completing his course of doxycycline. He has his Key[facility] antibiotic with him today. 7/31; patient presents for follow-up. We have been using Iodoflex with [facility] under compression therapy. He has no issues or complaints today. He denies signs of infection. 8/7; patient presents for follow-up. We have been using Iodoflex with [facility] under compression therapy. Home health came out once last week to change the wrap. He has no issues or complaints today. He denies signs of infection. Electronic Signature(s) Signed: 12/19/2021 11:21:12 AM By: Kalman Shan DO Entered By: Kalman Shan on 12/19/2021 11:16:24 -------------------------------------------------------------------------------- Physical Exam Details Patient Name: Date of Service: Corey [name], [name] 12/19/2021 10:45 A M Medical Record Number: [id number] Patient Account Number: [id number] Date of Birth/Sex: Treating RN: Oct 15, 1945 (76 y.o. [name] Primary Care Provider: Billey Gosling Other Clinician: Referring Provider: Treating  Provider/Extender: [name] in Treatment: 6 Constitutional respirations regular, non-labored and within target range for patient.. Cardiovascular 2+ dorsalis pedis/posterior tibialis pulses. Psychiatric pleasant and cooperative. Notes Right lower extremity: T the anterior aspect there is granulation tissue, nonviable tissue and tightly adhered necrotic tissue. No signs of surrounding infection. o Electronic Signature(s) Signed: 12/19/2021 11:21:12 AM By: Kalman Shan DO Entered By: Kalman Shan on 12/19/2021 11:17:19 -------------------------------------------------------------------------------- Physician Orders Details Patient Name: Date of Service: Corey [name], [name] 12/19/2021 10:45 A M Medical Record Number: [id number] Patient Account Number: [id number] Date of Birth/Sex: Treating RN: May 22, 1945 (76 y.o. [name] Primary Care Provider: Billey Gosling Other Clinician: Referring Provider: Treating Provider/Extender: Mickle Asper in Treatment: 6 Verbal / Phone Orders: No Diagnosis Coding ICD-10 Coding Code Description O11.572 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 with other specified severity E11.622 Type 2 diabetes mellitus with other skin ulcer I50.42 Chronic combined systolic (congestive) and diastolic (congestive) heart failure I25.119 Atherosclerotic heart disease of native coronary artery with unspecified angina pectoris Follow-up Appointments ppointment in 1 week. - Dr. Heber [place] and [place], Room 8 Monday 1115 12/26/2021 Return A ppointment in 2 weeks. - Dr. Heber [place] and [place], Room 8 Monday 1115 01/02/2022 Return A Other: - ****[place] topical compounding antibiotics bring to appt each time.**** Bathing/ Shower/ Hygiene May shower with protection but do not get wound dressing(s) wet. Edema Control - Lymphedema / SCD / Other Elevate legs to the level of the heart or above for  30 minutes daily and/or when sitting, a frequency of: - 3-4 times a day throughout the day. Avoid standing for long periods of time. Home Health New wound care orders this week; continue Home Health for wound care. May utilize formulary equivalent dressing for wound treatment orders unless otherwise specified. - home health to twice a week. Compounding topical antibiotics directly to wound bed and then apply [place]. DO NOT USE BORDERED FOAM. Other Home Health Orders/Instructions: - [facility] Wound Treatment Wound #1 - Lower Leg Wound Laterality: Right,  Lateral Cleanser: Soap and Water (Home Health) 3 x Per Week/30 Days Discharge Instructions: May shower and wash wound with dial antibacterial soap and water prior to dressing change. Cleanser: Wound Cleanser (Home Health) 3 x Per Week/30 Days Discharge Instructions: Cleanse the wound with wound cleanser prior to applying a clean dressing using gauze sponges, not tissue or cotton balls. Prim Dressing: Hydrofera Blue Classic Foam, 4x4 in (Home Health) 3 x Per Week/30 Days ary Discharge Instructions: Moisten with saline prior to apply OVER THE KEYSTONE. Prim Dressing: Compounding T ary opical antibiotics (Home Health) 3 x Per Week/30 Days Discharge Instructions: apply the [facility] compounding topical antibiotics directly to wound bed. Secondary Dressing: ABD Pad, 8x10 (Home Health) 3 x Per Week/30 Days Discharge Instructions: Apply over primary dressing as directed. Secondary Dressing: CarboFLEX Odor Control Dressing, 4x4 in 3 x Per Week/30 Days Discharge Instructions: Apply over primary dressing as directed. Secondary Dressing: Woven Gauze Sponge, Non-Sterile 4x4 in (Home Health) 3 x Per Week/30 Days Discharge Instructions: Apply over primary dressing as directed. Secondary Dressing: Zetuvit Plus 4x8 in (Home Health) 3 x Per Week/30 Days Discharge Instructions: Apply over primary dressing as  directed. Compression Wrap: Kerlix Roll 4.5x3.1 (in/yd) (Home Health) 3 x Per Week/30 Days Discharge Instructions: Apply Kerlix and Coban compression as directed. Compression Wrap: Coban Self-Adherent Wrap 4x5 (in/yd) (Home Health) 3 x Per Week/30 Days Discharge Instructions: Apply over Kerlix as directed. Ensure you wrap from base of toes to upper portion of lower leg just below the knee. Electronic Signature(s) Signed: 12/19/2021 11:21:12 AM By: Kalman Shan DO Entered By: Kalman Shan on 12/19/2021 11:17:30 -------------------------------------------------------------------------------- Problem List Details Patient Name: Date of Service: Corey [name], [name] 12/19/2021 10:45 A M Medical Record Number: [id number] Patient Account Number: [id number] Date of Birth/Sex: Treating RN: [birth year] (76 y.o. [name] Primary Care Provider: Billey Gosling Other Clinician: Referring Provider: Treating Provider/Extender: Mickle Asper in Treatment: 6 Active Problems ICD-10 Encounter Code Description Active Date MDM Diagnosis L97.918 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 with other 11/01/2021 No Yes specified severity E11.622 Type 2 diabetes mellitus with other skin ulcer 11/01/2021 No Yes I50.42 Chronic combined systolic (congestive) and diastolic (congestive) heart failure 11/01/2021 No Yes I25.119 Atherosclerotic heart disease of native coronary artery with unspecified angina 11/01/2021 No Yes pectoris Inactive Problems Resolved Problems Electronic Signature(s) Signed: 12/19/2021 11:21:12 AM By: Kalman Shan DO Entered By: Kalman Shan on 12/19/2021 11:15:00 -------------------------------------------------------------------------------- Progress Note Details Patient Name: Date of Service: Corey [name], [name] 12/19/2021 10:45 A M Medical Record Number: [id number] Patient Account Number: [id number] Date of Birth/Sex: Treating  RN: 11-25-1945 (76 y.o. [name] Primary Care Provider: Billey Gosling Other Clinician: Referring Provider: Treating Provider/Extender: Mickle Asper in Treatment: 6 Subjective Chief Complaint Information obtained from Patient 11/01/2021; right lower extremity wound History of Present Illness (HPI) Admission 11/01/2021 Mr. Taishawn Smaldone Is a 76 year old male with a past medical history of of type 2 diabetes, coronary artery disease, stage IV kidney disease acute on chronic combined systolic and diastolic heart failure that presents to the clinic for a 30-monthhistory of right lower extremity wound. He states he is not sure how this started. It is completely eschared. He reports his wound has been stable for the past month. He has been keeping the area covered. He has mild chronic pain. He denies signs of infection. He states he has a history of lower extremity ulcers last  being on the left leg. He could not tell me how this was treated in the past. 6/27; patient presents for follow-up. He is scheduled for his lower extremity arterial studies on July 6th. He has been using Medihoney to the wound bed. He has no issues or complaints today. He denies signs of infection. 7/6; patient presents for follow-up. He has home health that reported the wound having odor and increased pain last week. He was sent to urgent care. He was started on doxycycline. He is continuing Medihoney. He has his scheduled ABIs w/TBIs today. 7/10; patient presents for follow-up. He had his arterial studies done last week. He had triphasic waveforms throughout the right lower extremity. His TBI was 0.64. He has good pedal pulses. He is almost done with his course of antibiotics. He has been using mupirocin ointment to the wound bed. 7/17; patient presents for follow-up. He has been using Iodosorb under compression therapy. He has no issues or complaints today. He states he is receiving [facility]  antibiotic tomorrow In the mail. 7/24; patient presents for follow-up. He has been doing Dakin's wet-to-dry dressings to the wound bed daily without issues. He currently denies signs of infection. He reports completing his course of doxycycline. He has his [facility] antibiotic with him today. 7/31; patient presents for follow-up. We have been using Iodoflex with [facility] under compression therapy. He has no issues or complaints today. He denies signs of infection. 8/7; patient presents for follow-up. We have been using Iodoflex with [facility] under compression therapy. Home health came out once last week to change the wrap. He has no issues or complaints today. He denies signs of infection. Patient History Family History Cancer - Father, Diabetes - Mother,Father, Heart Disease - Mother,Siblings, Hypertension - Mother,Father. Social History Former smoker - quit 1970, Marital Status - Married, Drug Use - No History, Caffeine Use - Moderate - coffee. Medical History Eyes Patient has history of Cataracts Respiratory Patient has history of Sleep Apnea - has CPAP does not wear Cardiovascular Patient has history of Congestive Heart Failure, Hypertension, Myocardial Infarction - 2014 Endocrine Patient has history of Type II Diabetes Musculoskeletal Patient has history of Gout Neurologic Denies history of Seizure Disorder Hospitalization/Surgery History - 06/2021 inguinal hernia repair. - cardioversion 09/2020. - ventral hernia repair 3/19. 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 Vitals Time Taken: 10:28 AM, Height: 66 in, Weight: 145 lbs, BMI: 23.4, Temperature: 98.1 F, Pulse: 50 bpm, Respiratory Rate: 18 breaths/min, Blood Pressure:  174/91 mmHg. Cardiovascular 2+ dorsalis pedis/posterior tibialis pulses. Psychiatric pleasant and cooperative. General Notes: Right lower extremity: T the anterior aspect there is granulation tissue, nonviable tissue and tightly adhered necrotic tissue. No signs of o surrounding infection. Integumentary (Hair, Skin) Wound #1 status is Open. Original cause of wound was Gradually Appeared. The date acquired was: 08/15/2021. The wound has been in treatment 6 weeks. The wound is located on the Right,Lateral Lower Leg. The wound measures 15cm length x 9.2cm width x 0.2cm depth; 108.385cm^2 area and 21.677cm^3 volume. There is Fat Layer (Subcutaneous Tissue) exposed. There is no tunneling or undermining noted. There is a none present amount of drainage noted. The wound margin is distinct with the outline attached to the wound base. There is no granulation within the wound bed. There is a large (67-100%) amount of necrotic tissue within the wound  bed including Eschar and Adherent Slough. 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 Type 2 diabetes mellitus with other skin ulcer Chronic combined systolic (congestive) and diastolic (congestive) heart failure Atherosclerotic heart disease of native coronary artery with unspecified angina pectoris Patient's wound has more granulation tissue present. Size is stable. I debrided necrotic debris and nonviable tissue. No signs of surrounding infection. Would like to switch the dressing from Iodoflex to [facility]. We will continue with [facility] antibiotic. All under compression therapy. Plan Follow-up Appointments: Return Appointment in 1 week. - Dr. Heber [place] and [place], Room 8 Monday 1115 12/26/2021 Return Appointment in 2 weeks. - Dr. Heber [place] and [place], Room 8 Monday 1115 01/02/2022 Other: - ****[facility] topical compounding antibiotics bring to appt each  time.**** Bathing/ Shower/ Hygiene: May shower with protection but do not get wound dressing(s) wet. Edema Control - Lymphedema / SCD / Other: Elevate legs to the level of the heart or above for 30 minutes daily and/or when sitting, a frequency of: - 3-4 times a day throughout the day. Avoid standing for long periods of time. Home Health: New wound care orders this week; continue Home Health for wound care. May utilize formulary equivalent dressing for wound treatment orders unless otherwise specified. - home health to twice a week. Compounding topical antibiotics directly to wound bed and then apply [place]. DO NOT USE BORDERED FOAM. Other Home Health Orders/Instructions: - [facility] WOUND #1: - Lower Leg Wound Laterality: Right, Lateral Cleanser: Soap and Water (Home Health) 3 x Per Week/30 Days Discharge Instructions: May shower and wash wound with dial antibacterial soap and water prior to dressing change. Cleanser: Wound Cleanser (Home Health) 3 x Per Week/30 Days Discharge Instructions: Cleanse the wound with wound cleanser prior to applying a clean dressing using gauze sponges, not tissue or cotton balls. Prim Dressing: Hydrofera Blue Classic Foam, 4x4 in (Home Health) 3 x Per Week/30 Days ary Discharge Instructions: Moisten with saline prior to apply OVER THE KEYSTONE. Prim Dressing: Compounding T ary opical antibiotics (Home Health) 3 x Per Week/30 Days Discharge Instructions: apply the [facility] compounding topical antibiotics directly to wound bed. Secondary Dressing: ABD Pad, 8x10 (Home Health) 3 x Per Week/30 Days Discharge Instructions: Apply over primary dressing as directed. Secondary Dressing: CarboFLEX Odor Control Dressing, 4x4 in 3 x Per Week/30 Days Discharge Instructions: Apply over primary dressing as directed. Secondary Dressing: Woven Gauze Sponge, Non-Sterile 4x4 in (Home Health) 3 x Per Week/30 Days Discharge Instructions: Apply  over primary dressing as directed. Secondary Dressing: Zetuvit Plus 4x8 in (Home Health) 3 x Per Week/30 Days Discharge Instructions: Apply over primary dressing as directed. Compression Wrap: Kerlix Roll 4.5x3.1 (in/yd) (Home Health) 3 x Per Week/30 Days Discharge Instructions: Apply Kerlix and Coban compression as directed. Compression Wrap: Coban Self-Adherent Wrap 4x5 (in/yd) (Home Health) 3 x Per Week/30 Days Discharge Instructions: Apply over Kerlix as directed. Ensure you wrap from base of toes to upper portion of lower leg just below the knee. 1. In office sharp debridement 2. Hydrofera Blue and [facility] antibiotic under Kerlix/Coban 3. Follow-up in 1 week Electronic Signature(s) Signed: 12/19/2021 11:21:12 AM By: Kalman Shan DO Entered By: Kalman Shan on 12/19/2021 11:19:21 -------------------------------------------------------------------------------- HxROS Details Patient Name: Date of Service: Corey [name], [name] 12/19/2021 10:45 A M Medical Record Number: [id number] Patient Account Number: [id number] Date of Birth/Sex: Treating RN: 1945-05-26 (76 y.o. [name] Primary Care  Provider: Billey Gosling Other Clinician: Referring Provider: Treating Provider/Extender: Mickle Asper in Treatment: 6 Constitutional Symptoms (General Health) Medical History: Past Medical History Notes: CVA 2014 bilateral hearing loss Eyes Medical History: Positive for: Cataracts Respiratory Medical History: Positive for: Sleep Apnea - has CPAP does not wear Cardiovascular Medical History: Positive for: Congestive Heart Failure; Hypertension; Myocardial Infarction - 2014 Gastrointestinal Medical History: Past Medical History Notes: Diverticulosis Endocrine Medical History: Positive for: Type II Diabetes Time with diabetes: >20 years Treated with: Oral agents, Diet Blood sugar tested every day: Yes Tested : BID Genitourinary Medical History: Past  Medical History Notes: Renal insufficiency 20/ Musculoskeletal Medical History: Positive for: Gout Neurologic Medical History: Negative for: Seizure Disorder Oncologic Medical History: Past Medical History Notes: Prostate Ca HBO Extended History Items Eyes: Cataracts Immunizations Pneumococcal Vaccine: Received Pneumococcal Vaccination: No Implantable Devices No devices added Hospitalization / Surgery History Type of Hospitalization/Surgery 06/2021 inguinal hernia repair cardioversion 09/2020 ventral hernia repair 3/19 Family and Social History Cancer: Yes - Father; Diabetes: Yes - Mother,Father; Heart Disease: Yes - Mother,Siblings; Hypertension: Yes - Mother,Father; Former smoker - quit 1970; Marital Status - Married; Drug Use: No History; Caffeine Use: Moderate - coffee; Financial Concerns: No; Food, Clothing or Shelter Needs: No; Support System Lacking: No; Transportation Concerns: No Electronic Signature(s) Signed: 12/19/2021 11:21:12 AM By: Kalman Shan DO Signed: 12/19/2021 4:18:42 PM By: Deon Pilling RN, BSN Entered By: Kalman Shan on 12/19/2021 11:16:47 -------------------------------------------------------------------------------- SuperBill Details Patient Name: Date of Service: Corey [name], [name] 12/19/2021 Medical Record Number: [id number] Patient Account Number: [id number] Date of Birth/Sex: Treating RN: 29-Apr-1946 (76 y.o. [name] Primary Care Provider: Billey Gosling Other Clinician: Referring Provider: Treating Provider/Extender: [name] in Treatment: 6 Diagnosis Coding ICD-10 Codes Code Description [phone] 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 with other specified severity E11.622 Type 2 diabetes mellitus with other skin ulcer I50.42 Chronic combined systolic (congestive) and diastolic (congestive) heart failure I25.119 Atherosclerotic heart disease of native coronary artery with  unspecified angina pectoris Facility Procedures CPT4 Code: [id number] I Description: 76226 - DEB SUBQ TISSUE 20 SQ CM/< CD-10 Diagnosis Description L97.918 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 with other specif Modifier: ied severity Quantity: 1 CPT4 Code: [id number] 11 I Description: 045 - DEB SUBQ TISS EA ADDL 20CM CD-10 Diagnosis Description L97.918 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 with other specif Modifier: ied severity Quantity: 4 Physician Procedures : CPT4 Code Description Modifier [id number] 11042 - WC PHYS SUBQ TISS 20 SQ CM ICD-10 Diagnosis Description L97.918 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 with other specified severity Quantity: 1 : [id number] 11572 - WC PHYS SUBQ TISS EA ADDL 20 CM ICD-10 Diagnosis Description L97.918 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 with other specified severity Quantity: 4 Electronic Signature(s) Signed: 12/19/2021 11:21:12 AM By: Kalman Shan DO Entered By: Kalman Shan on 12/19/2021 11:20:53

## 2021-12-22 ENCOUNTER — Ambulatory Visit: Payer: Medicare HMO | Admitting: *Deleted

## 2021-12-22 ENCOUNTER — Encounter: Payer: Self-pay | Admitting: *Deleted

## 2021-12-22 DIAGNOSIS — I5020 Unspecified systolic (congestive) heart failure: Secondary | ICD-10-CM

## 2021-12-22 DIAGNOSIS — E1122 Type 2 diabetes mellitus with diabetic chronic kidney disease: Secondary | ICD-10-CM

## 2021-12-22 NOTE — Chronic Care Management (AMB) (Signed)
Care Management    RN Visit Note  12/22/2021 Name: Corey [name] MRN: [id number] DOB: August 13, 1945  Subjective: Corey [name] is a 76 y.o. year old male who is a primary care patient of Burns, [name], MD. The care management team was consulted for assistance with disease management and care coordination needs.    Engaged with patient and his spouse/ caregiver [place], on [facility] DPR by telephone for follow up visit/ RN CM case closure in response to provider referral for case management and/or care coordination services.   Consent to Services:   Mr. Kopko was given information about Care Management services 10/18/20 including:  Care Management services includes personalized support from designated clinical staff supervised by his physician, including individualized plan of care and coordination with other care providers 24/7 contact phone numbers for assistance for urgent and routine care needs. The patient may stop case management services at any time by phone call to the office staff.  Patient agreed to services and consent obtained.   Assessment: Review of patient past medical history, allergies, medications, health status, including review of consultants reports, laboratory and other test data, was performed as part of comprehensive evaluation and provision of chronic care management services.   SDOH (Social Determinants of Health) assessments and interventions performed:  SDOH Interventions    Flowsheet Row Most Recent Value  SDOH Interventions   Transportation Interventions Intervention Not Indicated  [patient currently using insuarnce benefit for transportation,  family also assists as indicated]     Care Plan  Allergies  Allergen Reactions   Hydrochlorothiazide Other (See Comments)    Gout , uncontrolled diabetes and renal insufficiency   Outpatient Encounter Medications as of 12/22/2021  Medication Sig   ACCU-CHEK AVIVA PLUS test strip TEST BLOOD SUGAR TWICE DAILY AS DIRECTED    ACCU-CHEK AVIVA PLUS test strip Use as instructed   albuterol (VENTOLIN HFA) 108 (90 Base) MCG/ACT inhaler Inhale 2 puffs into the lungs every 4 (four) hours as needed for wheezing or shortness of breath.   allopurinol (ZYLOPRIM) 100 MG tablet TAKE 1/2 TABLET ONE TIME DAILY   aspirin EC 81 MG tablet Take 1 tablet (81 mg total) by mouth daily.   bismuth subsalicylate (PEPTO BISMOL) 262 MG/15ML suspension Take 30 mLs by mouth every 6 (six) hours as needed for indigestion.   colchicine (COLCRYS) 0.6 MG tablet Take 2 tabs once and then one tab one hour later as needed for gout   doxycycline (VIBRAMYCIN) 100 MG capsule Take 1 capsule (100 mg total) by mouth 2 (two) times daily.   glipiZIDE (GLUCOTROL XL) 2.5 MG 24 hr tablet Take 1 tablet (2.5 mg total) by mouth daily with breakfast.   hydrALAZINE (APRESOLINE) 50 MG tablet Take 2 tablets (100 mg total) by mouth 3 (three) times daily.   nitroGLYCERIN (NITROSTAT) 0.4 MG SL tablet Place 0.4 mg under the tongue every 5 (five) minutes as needed for chest pain.   rosuvastatin (CRESTOR) 40 MG tablet Take 1 tablet (40 mg total) by mouth daily.   sitaGLIPtin (JANUVIA) 25 MG tablet Take 1 tablet (25 mg total) by mouth daily.   Tafamidis (VYNDAMAX) 61 MG CAPS Take 61 mg by mouth daily.   torsemide (DEMADEX) 20 MG tablet Take 1 tablet (20 mg total) by mouth daily.   TRUEplus Lancets 33G MISC USE 4 (FOUR) TIMES DAILY   No facility-administered encounter medications on file as of 12/22/2021.   Patient Active Problem List   Diagnosis Date Noted   Leg  wound, right, initial encounter 10/24/2021   Weakness of both lower extremities 10/24/2021   Chronic kidney disease (CKD), stage IV (severe) ([place]) 09/29/2021   Pure hypercholesterolemia 07/05/2021   Preoperative cardiovascular examination 04/04/2021   HFrEF (heart failure with reduced ejection fraction) ([place]) 04/03/2021   Bilateral hearing loss 01/04/2021   Bilateral impacted cerumen 01/04/2021   Tympanic membrane  perforation, left 01/04/2021   Physical deconditioning 10/16/2020   Hereditary cardiac amyloidosis ([place]) 09/30/2020   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   Progressive angina ([place]) - Atypical; DOE 09/20/2020   CAD (coronary artery disease) 09/20/2020   Acute on chronic combined systolic and diastolic CHF (congestive heart failure) ([place]) 09/20/2020   Acute combined systolic and diastolic heart failure ([place]) 09/20/2020   Soft tissue mass 07/30/2020   Contracture of joint of finger of left hand 07/30/2020   DOE (dyspnea on exertion) 03/01/2019   Mild cognitive impairment 12/05/2018   Muscle twitching 07/23/2018   OSA (obstructive sleep apnea) 03/27/2018   Snores 01/21/2018   Poor balance 01/23/2017   Ankle swelling, left 09/06/2016   Bradycardia 01/28/2016   Weak urinary stream 12/31/2015   Ventral hernia without obstruction or gangrene 09/15/2015   Prostate cancer ([place]) 07/25/2015   Erectile dysfunction 05/04/2015   Urinary frequency 05/04/2015   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   Abnormal finding on MRI of brain 02/14/2015   Leg wound, right 01/08/2013   PEA (Pulseless electrical activity) ([place]) 12/03/2012   Diabetes ([place]) 06/30/2011   Gout 03/05/2007   Essential hypertension 03/05/2007   Conditions to be addressed/monitored: CHF and DMII  Care Plan : RN Care Manager Plan of Care  Updates made by Knox Royalty, RN since 12/22/2021 12:00 AM     Problem: Chronic Disease Management Needs   Priority: High     Long-Range Goal: Ongoing adherence to established plan of care for long term chronic disease management   Start Date: 05/12/2021  Expected End Date: 05/12/2022  Priority: High  Note:   Current Barriers:  Chronic Disease Management support and education needs related to CHF and DMII Unable to independently manage medications: reports decreased vision due to glaucoma; wife and son assists in medication management Limited health  literacy, hard of hearing in (L) ear: wife participates in all of patient medical affairs and is very supportive/ helpful Challenges following low salt diet  RNCM Clinical Goal(s):  Patient will demonstrate ongoing health management independence as evidenced by DMII; CHF/ CKD- III        through collaboration with RN Care manager, provider, and care team.   Interventions: 1:1 collaboration with primary care provider regarding development and update of comprehensive plan of care as evidenced by provider attestation and co-signature Inter-disciplinary care team collaboration (see longitudinal plan of care) Evaluation of current treatment plan related to  self management and patient's adherence to plan as established by provider Review of patient status, including review of consultants reports, relevant laboratory and other test results, and medications completed SDOH updated: no new/ unmet concerns identified Pain assessment updated: denies pain today; reports intermittent minimal discomfort of (R) leg wound, "mainly when they do the weekly treatments" Falls assessment updated: continues to deny new/ recent falls x 12 months- currently not using assistive devices;  positive reinforcement provided with encouragement to continue efforts at fall prevention; previously provided education around fall risks/ prevention reinforced Medications discussed: reports spouse continues to manage medications and they deny current concerns/ issues/ questions around medications; endorses adherence to taking all  medications as prescribed Discussed recent call to PCP office regarding concern with constipation: provided education around home remedies including diet/ fiber, prune juice, etc along with OTC medications- patient states he is taking stool softeners and occasionally miralax, which was recommended- states "helps some" Reviewed upcoming scheduled provider appointments: 12/26/21 and 01/02/22- wound clinic; 01/06/22-  renal provider; 01/12/22- cardiology provider; 01/17/22- endocrinology lab work; 01/20/22- endocrinology provider; patient/ caregiver confirm they are aware of all and have plans to attend as scheduled; reminded patient and spouse that flu season is approaching and encouraged them to ensure that they obtain flu vaccine- they verbalize understanding and agreement Discussed plans with patient/ spouse for ongoing care management follow up- they both deny current care coordination/ care management needs and is agreeable to CCM RN CM case closure today; verbalizes understanding to contact PCP or other care providers for any needs that arise in the future, and both confirm they has contact information for all care providers     Heart Failure Interventions:  (Status: 12/22/21- Goal Met.)  Long Term Goal  Wt Readings from Last 3 Encounters:  10/24/21 157 lb (71.2 kg)  10/14/21 168 lb 3.2 oz (76.3 kg)  10/11/21 167 lb (75.8 kg)  Basic overview and discussion of pathophysiology of Heart Failure reviewed Provided education on low sodium diet Discussed importance of daily weight and advised patient to weigh and record daily Reviewed role of diuretics in prevention of fluid overload and management of heart failure Discussed the importance of keeping all appointments with provider Provided patient with education about the role of exercise in the management of heart failure Assessed social determinant of health barriers Confirmed patient continues attending regularly scheduled appointments/ visits with the wound clinic; reviewed recent visits with patient and caregiver/ spouse: they report "seems to be getting much better;" patient states, "the doctor out there has said it is getting much better" Confirmed patient continues actively participating in home health services for RN (wound assessment/ care) through [facility] [facility]- report home health PT has "signed off;" report home health visits going well; encouraged  patient's ongoing engagement with home health team/ wound clinic visits as long as these interventions are in place Reviewed recent weights at home: he reports weight this morning of "150 lbs," and general weight ranges between "148-151 lbs" and denies weight gain > 3 lbs overnight/ 5 lbs in one week; today, he denies clinical concerns and reports "breathing just fine" and denies lower extremity swelling; endorses adherence to taking prescribed diuretic as prescribed, 3 times per week, with additional doses as needed for overnight weight gain Reinforced previously provided education around action plan for overnight weight gain > 3 lbs/ weekly > 5 lbs: call cardiology provider promptly; explained again that prompt action to address weight gain/ signs/ symptoms can prevent worsening of symptoms and possibly avoid hospitalization Reinforced signs/ symptoms yellow CHF zone reinforced: today patient and wife verbalize a good understanding of same, including action plan Reinforced importance of low salt, heart healthy diet and encouraged patient to be adherent to prescribed diet  Diabetes:  (Status: 12/22/21- Goal Met.) Long Term Goal   Lab Results  Component Value Date   HGBA1C 7.0 (H) 08/09/2021  Provided education to patient about basic DM disease process; Reviewed prescribed diet with patient low sugar, carbohydrate modified; heart healthy. Low salt;  Confirmed patient continues to "try to eat right right;" positive reinforcement provided with encouragement to continue efforts Reviewed recent blood sugars at home with patient- he reports consistent fasting values "  between 100-124" and confirms again that he is not routinely monitoring post-prandial blood sugars recently, however, he tells me that when he does monitor after eating "they are running around 130-140"  Denies recent low blood sugars at home- reinforced previously provided education around signs/ symptoms low blood sugar along with  corresponding action plan      Plan:  No further follow up required: patient and spouse/ caregiver both deny current care coordination/ care management needs and is agreeable to RN CM case closure today; case closure accordingly     [name], RN, BSN, [place] [phone]: direct office

## 2021-12-26 ENCOUNTER — Encounter (HOSPITAL_BASED_OUTPATIENT_CLINIC_OR_DEPARTMENT_OTHER): Payer: Medicare HMO | Admitting: Internal Medicine

## 2021-12-26 DIAGNOSIS — I25119 Atherosclerotic heart disease of native coronary artery with unspecified angina pectoris: Secondary | ICD-10-CM | POA: Diagnosis not present

## 2021-12-26 DIAGNOSIS — N184 Chronic kidney disease, stage 4 (severe): Secondary | ICD-10-CM | POA: Diagnosis not present

## 2021-12-26 DIAGNOSIS — L97918 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 with other specified severity: Secondary | ICD-10-CM | POA: Diagnosis not present

## 2021-12-26 DIAGNOSIS — E11622 Type 2 diabetes mellitus with other skin ulcer: Secondary | ICD-10-CM | POA: Diagnosis not present

## 2021-12-26 DIAGNOSIS — I5042 Chronic combined systolic (congestive) and diastolic (congestive) heart failure: Secondary | ICD-10-CM | POA: Diagnosis not present

## 2021-12-26 DIAGNOSIS — I130 Hypertensive heart and chronic kidney disease with heart failure and stage 1 through stage 4 chronic kidney disease, or unspecified chronic kidney disease: Secondary | ICD-10-CM | POA: Diagnosis not present

## 2021-12-26 DIAGNOSIS — R6889 Other general symptoms and signs: Secondary | ICD-10-CM | POA: Diagnosis not present

## 2021-12-26 DIAGNOSIS — I87311 Chronic venous hypertension (idiopathic) with ulcer of right lower extremity: Secondary | ICD-10-CM | POA: Diagnosis not present

## 2021-12-27 ENCOUNTER — Telehealth: Payer: Medicare HMO

## 2021-12-30 NOTE — Progress Notes (Signed)
Corey [name], Corey [name] ([id number]) Visit Report for 12/26/2021 Chief Complaint Document Details Patient Name: Date of Service: Corey [name]. 12/26/2021 11:15 A [name] Medical Record Number: [id number] Patient Account Number: [id number] Date of Birth/Sex: Treating RN: [birth year] (76 y.o. [name] Primary Care Provider: Billey Gosling Other Clinician: Referring Provider: Treating Provider/Extender: [name] in Treatment: 7 Information Obtained from: Patient Chief Complaint 11/01/2021; right lower extremity wound Electronic Signature(s) Signed: 12/26/2021 12:18:02 PM By: Kalman Shan DO Entered By: Kalman Shan on 12/26/2021 11:52:46 -------------------------------------------------------------------------------- Debridement Details Patient Name: Date of Service: Corey [name], Corey [name]. 12/26/2021 11:15 A [name] Medical Record Number: [id number] Patient Account Number: [id number] Date of Birth/Sex: Treating RN: 1945-11-09 (76 y.o. [name] Primary Care Provider: Billey Gosling Other Clinician: Referring Provider: Treating Provider/Extender: Mickle Asper in Treatment: 7 Debridement Performed for Assessment: Wound #1 Right,Lateral Lower Leg Performed By: Physician Kalman Shan, DO Debridement Type: Debridement Severity of Tissue Pre Debridement: Fat layer exposed Level of Consciousness (Pre-procedure): Awake and Alert Pre-procedure Verification/Time Out Yes - 11:30 Taken: Start Time: 11:31 Pain Control: Lidocaine 5% topical ointment T Area Debrided (L x W): otal 13 (cm) x 8 (cm) = 104 (cm) Tissue and other material debrided: Viable, Non-Viable, Slough, Subcutaneous, Fibrin/Exudate, Slough Level: Skin/Subcutaneous Tissue Debridement Description: Excisional Instrument: Curette Bleeding: Minimum Hemostasis Achieved: Pressure End Time: 11:38 Procedural Pain: 0 Post Procedural Pain: 0 Response to Treatment: Procedure was tolerated  well Level of Consciousness (Post- Awake and Alert procedure): Post Debridement Measurements of Total Wound Length: (cm) 13.7 Width: (cm) 8.5 Depth: (cm) 0.2 Volume: (cm) 18.292 Character of Wound/Ulcer Post Debridement: Requires Further Debridement Severity of Tissue Post Debridement: Fat layer exposed Post Procedure Diagnosis Same as Pre-procedure Electronic Signature(s) Signed: 12/26/2021 12:18:02 PM By: Kalman Shan DO Signed: 12/26/2021 4:35:20 PM By: Deon Pilling RN, BSN Entered By: Deon Pilling on 12/26/2021 11:38:38 -------------------------------------------------------------------------------- HPI Details Patient Name: Date of Service: Corey [name], Corey [name]. 12/26/2021 11:15 A [name] Medical Record Number: [id number] Patient Account Number: [id number] Date of Birth/Sex: Treating RN: 11-21-1945 (76 y.o. [name] Primary Care Provider: Billey Gosling Other Clinician: Referring Provider: Treating Provider/Extender: Mickle Asper in Treatment: 7 History of Present Illness HPI Description: Admission 11/01/2021 Corey [name] Is a 76 year old male with a past medical history of of type 2 diabetes, coronary artery disease, stage IV kidney disease acute on chronic combined systolic and diastolic heart failure that presents to the clinic for a 60-monthhistory of right lower extremity wound. He states he is not sure how this started. It is completely eschared. He reports his wound has been stable for the past month. He has been keeping the area covered. He has mild chronic pain. He denies signs of infection. He states he has a history of lower extremity ulcers last being on the left leg. He could not tell me how this was treated in the past. 6/27; patient presents for follow-up. He is scheduled for his lower extremity arterial studies on July 6th. He has been using Medihoney to the wound bed. He has no issues or complaints today. He denies signs of  infection. 7/6; patient presents for follow-up. He has home health that reported the wound having odor and increased pain last week. He was sent to urgent care. He was started on doxycycline. He is continuing Medihoney. He has his scheduled ABIs w/TBIs today. 7/10; patient presents for follow-up. He had his arterial studies  done last week. He had triphasic waveforms throughout the right lower extremity. His TBI was 0.64. He has good pedal pulses. He is almost done with his course of antibiotics. He has been using mupirocin ointment to the wound bed. 7/17; patient presents for follow-up. He has been using Iodosorb under compression therapy. He has no issues or complaints today. He states he is receiving Keystone antibiotic tomorrow In the mail. 7/24; patient presents for follow-up. He has been doing Dakin's wet-to-dry dressings to the wound bed daily without issues. He currently denies signs of infection. He reports completing his course of doxycycline. He has his Keystone antibiotic with him today. 7/31; patient presents for follow-up. We have been using Iodoflex with [facility] under compression therapy. He has no issues or complaints today. He denies signs of infection. 8/7; patient presents for follow-up. We have been using Iodoflex with [facility] under compression therapy. Home health came out once last week to change the wrap. He has no issues or complaints today. He denies signs of infection. 8/14; patient presents for follow-up. We have been using Keystone with Hydrofera Blue under compression therapy. He has tolerated this well. He has no issues or complaints today. Electronic Signature(s) Signed: 12/26/2021 12:18:02 PM By: Kalman Shan DO Entered By: Kalman Shan on 12/26/2021 11:53:19 -------------------------------------------------------------------------------- Physical Exam Details Patient Name: Date of Service: Corey [name], Corey [name]. 12/26/2021 11:15 A [name] Medical Record Number:  [id number] Patient Account Number: [id number] Date of Birth/Sex: Treating RN: 22-Oct-1945 (76 y.o. [name] Primary Care Provider: Billey Gosling Other Clinician: Referring Provider: Treating Provider/Extender: [name] in Treatment: 7 Constitutional respirations regular, non-labored and within target range for patient.. Cardiovascular 2+ dorsalis pedis/posterior tibialis pulses. Psychiatric pleasant and cooperative. Notes Right lower extremity: T the anterior aspect there is granulation tissue, nonviable tissue and tightly adhered necrotic tissue. No signs of surrounding infection. o Electronic Signature(s) Signed: 12/26/2021 12:18:02 PM By: Kalman Shan DO Entered By: Kalman Shan on 12/26/2021 11:53:40 -------------------------------------------------------------------------------- Physician Orders Details Patient Name: Date of Service: Corey [name], Corey [name]. 12/26/2021 11:15 A [name] Medical Record Number: [id number] Patient Account Number: [id number] Date of Birth/Sex: Treating RN: 1945/10/10 (76 y.o. [name] Primary Care Provider: Billey Gosling Other Clinician: Referring Provider: Treating Provider/Extender: Mickle Asper in Treatment: 7 Verbal / Phone Orders: No Diagnosis Coding ICD-10 Coding Code Description N23.557 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 with other specified severity E11.622 Type 2 diabetes mellitus with other skin ulcer I50.42 Chronic combined systolic (congestive) and diastolic (congestive) heart failure I25.119 Atherosclerotic heart disease of native coronary artery with unspecified angina pectoris I87.311 Chronic venous hypertension (idiopathic) with ulcer of right lower extremity Follow-up Appointments ppointment in 1 week. - Dr. Heber [place] and [place], Room 8 Monday 1115 01/02/2022 Return A ppointment in 2 weeks. - Dr. Heber [place] and [place], Room 8 Monday 1115  01/09/2022 Return A Other: - ****[place] topical compounding antibiotics bring to appt each time.**** Anesthetic (In clinic) Topical Lidocaine 5% applied to wound bed Cellular or Tissue Based Products Cellular or Tissue Based Product Type: - run insurance for [employer], epicord, and organogensis advance tissue product. Bathing/ Shower/ Hygiene May shower with protection but do not get wound dressing(s) wet. Edema Control - Lymphedema / SCD / Other Elevate legs to the level of the heart or above for 30 minutes daily and/or when sitting, a frequency of: - 3-4 times a day throughout the day. Avoid standing for long periods  of time. Home Health New wound care orders this week; continue Home Health for wound care. May utilize formulary equivalent dressing for wound treatment orders unless otherwise specified. - home health to twice a week. Compounding topical antibiotics directly to wound bed and then apply [place]. DO NOT USE BORDERED FOAM. increasing the compression to 3 layer. Other Home Health Orders/Instructions: - [facility] Wound Treatment Wound #1 - Lower Leg Wound Laterality: Right, Lateral Cleanser: Soap and Water (Home Health) 3 x Per Week/30 Days Discharge Instructions: May shower and wash wound with dial antibacterial soap and water prior to dressing change. Cleanser: Wound Cleanser (Home Health) 3 x Per Week/30 Days Discharge Instructions: Cleanse the wound with wound cleanser prior to applying a clean dressing using gauze sponges, not tissue or cotton balls. Prim Dressing: Hydrofera Blue Classic Foam, 4x4 in (Home Health) 3 x Per Week/30 Days ary Discharge Instructions: Moisten with saline prior to apply OVER THE KEYSTONE. Prim Dressing: Compounding T ary opical antibiotics (Home Health) 3 x Per Week/30 Days Discharge Instructions: apply the [facility] compounding topical antibiotics directly to wound bed. Secondary Dressing: ABD Pad,  8x10 (Home Health) 3 x Per Week/30 Days Discharge Instructions: Apply over primary dressing as directed. Secondary Dressing: CarboFLEX Odor Control Dressing, 4x4 in 3 x Per Week/30 Days Discharge Instructions: Apply over primary dressing as directed. Secondary Dressing: Woven Gauze Sponge, Non-Sterile 4x4 in (Home Health) 3 x Per Week/30 Days Discharge Instructions: Apply over primary dressing as directed. Secondary Dressing: Zetuvit Plus 4x8 in (Home Health) 3 x Per Week/30 Days Discharge Instructions: Apply over primary dressing as directed. Compression Wrap: ThreePress (3 layer compression wrap) 3 x Per Week/30 Days Discharge Instructions: Apply three layer compression as directed. Patient Medications llergies: hydrochlorothiazide A Notifications Medication Indication Start End lidocaine DOSE topical 5 % gel - gel topical applied only for any debridement at clinic appt times. Electronic Signature(s) Signed: 12/26/2021 12:18:02 PM By: Kalman Shan DO Entered By: Kalman Shan on 12/26/2021 11:53:49 Prescription 12/26/2021 -------------------------------------------------------------------------------- [name]. Kalman Shan DO Patient Name: Provider: 03-20-1946 [id number] Date of Birth: NPI#: Jerilynn Mages [id number] Sex: DEA #: [phone] Phone #: License #: [place] Patient Address: [address] [place], [place] [address] [place], [place] [address] [phone] Allergies hydrochlorothiazide Medication Medication: Route: Strength: Form: lidocaine 5 % topical gel topical 5% gel Class: TOPICAL LOCAL ANESTHETICS Dose: Frequency / Time: Indication: gel topical applied only for any debridement at clinic appt times. Number of Refills: Number of Units: 0 Generic Substitution: Start Date: End Date: One Time Use: Substitution Permitted No Note to Pharmacy: Hand  Signature: Date(s): Electronic Signature(s) Signed: 12/26/2021 12:18:02 PM By: Kalman Shan DO Entered By: Kalman Shan on 12/26/2021 11:53:49 -------------------------------------------------------------------------------- Problem List Details Patient Name: Date of Service: Corey [name], Corey [name]. 12/26/2021 11:15 A [name] Medical Record Number: [id number] Patient Account Number: [id number] Date of Birth/Sex: Treating RN: 1946-04-16 (76 y.o. [name] Primary Care Provider: Billey Gosling Other Clinician: Referring Provider: Treating Provider/Extender: Mickle Asper in Treatment: 7 Active Problems ICD-10 Encounter Code Description Active Date MDM Diagnosis L97.918 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 with other 11/01/2021 No Yes specified severity E11.622 Type 2 diabetes mellitus with other skin ulcer 11/01/2021 No Yes I50.42 Chronic combined systolic (congestive) and diastolic (congestive) heart failure 11/01/2021 No Yes I25.119 Atherosclerotic heart disease of native coronary artery with unspecified angina 11/01/2021 No  Yes pectoris I87.311 Chronic venous hypertension (idiopathic) with ulcer of right lower extremity 12/26/2021 No Yes Inactive Problems Resolved Problems Electronic Signature(s) Signed: 12/26/2021 12:18:02 PM By: Kalman Shan DO Entered By: Kalman Shan on 12/26/2021 11:52:34 -------------------------------------------------------------------------------- Progress Note Details Patient Name: Date of Service: Corey [name], Corey [name]. 12/26/2021 11:15 A [name] Medical Record Number: [id number] Patient Account Number: [id number] Date of Birth/Sex: Treating RN: 1945-08-27 (76 y.o. [name] Primary Care Provider: Other Clinician: Billey Gosling Referring Provider: Treating Provider/Extender: Mickle Asper in Treatment: 7 Subjective Chief Complaint Information obtained from  Patient 11/01/2021; right lower extremity wound History of Present Illness (HPI) Admission 11/01/2021 [name] Is a 76 year old male with a past medical history of of type 2 diabetes, coronary artery disease, stage IV kidney disease acute on chronic combined systolic and diastolic heart failure that presents to the clinic for a 32-monthhistory of right lower extremity wound. He states he is not sure how this started. It is completely eschared. He reports his wound has been stable for the past month. He has been keeping the area covered. He has mild chronic pain. He denies signs of infection. He states he has a history of lower extremity ulcers last being on the left leg. He could not tell me how this was treated in the past. 6/27; patient presents for follow-up. He is scheduled for his lower extremity arterial studies on July 6th. He has been using Medihoney to the wound bed. He has no issues or complaints today. He denies signs of infection. 7/6; patient presents for follow-up. He has home health that reported the wound having odor and increased pain last week. He was sent to urgent care. He was started on doxycycline. He is continuing Medihoney. He has his scheduled ABIs w/TBIs today. 7/10; patient presents for follow-up. He had his arterial studies done last week. He had triphasic waveforms throughout the right lower extremity. His TBI was 0.64. He has good pedal pulses. He is almost done with his course of antibiotics. He has been using mupirocin ointment to the wound bed. 7/17; patient presents for follow-up. He has been using Iodosorb under compression therapy. He has no issues or complaints today. He states he is receiving Keystone antibiotic tomorrow In the mail. 7/24; patient presents for follow-up. He has been doing Dakin's wet-to-dry dressings to the wound bed daily without issues. He currently denies signs of infection. He reports completing his course of doxycycline. He has his  Keystone antibiotic with him today. 7/31; patient presents for follow-up. [facility] He has no issues or complaints today. He denies signs of infection. 8/7; patient presents for follow-up. [facility] Home health came out once last week to change the wrap. He has no issues or complaints today. He denies signs of infection. 8/14; patient presents for follow-up. We have been using Keystone with Hydrofera Blue under compression therapy. He has tolerated this well. He has no issues or complaints today. Patient History Family History Cancer - Father, Diabetes - Mother,Father, Heart Disease - Mother,Siblings, Hypertension - Mother,Father. Social History Former smoker - quit 1970, Marital Status - Married, Drug Use - No History, Caffeine Use - Moderate - coffee. Medical History Eyes Patient has history of Cataracts Respiratory Patient has history of Sleep Apnea - has CPAP does not wear Cardiovascular Patient has history of Congestive Heart Failure, Hypertension, Myocardial Infarction - 2014 Endocrine Patient  has history of Type II Diabetes Musculoskeletal Patient has history of Gout Neurologic Denies history of Seizure Disorder Hospitalization/Surgery History - 06/2021 inguinal hernia repair. - cardioversion 09/2020. - ventral hernia repair 3/19. 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 Vitals Time Taken: 10:58 AM, Height: 66 in, Weight: 145 lbs, BMI: 23.4, Temperature: 97.8 F, Pulse: 40 bpm, Respiratory Rate: 17 breaths/min, Blood Pressure: 154/85 mmHg. Cardiovascular 2+ dorsalis pedis/posterior tibialis  pulses. Psychiatric pleasant and cooperative. General Notes: Right lower extremity: T the anterior aspect there is granulation tissue, nonviable tissue and tightly adhered necrotic tissue. No signs of o surrounding infection. Integumentary (Hair, Skin) Wound #1 status is Open. Original cause of wound was Gradually Appeared. The date acquired was: 08/15/2021. The wound has been in treatment 7 weeks. The wound is located on the Right,Lateral Lower Leg. The wound measures 13.7cm length x 8.5cm width x 0.2cm depth; 91.46cm^2 area and 18.292cm^3 volume. There is Fat Layer (Subcutaneous Tissue) exposed. There is no tunneling or undermining noted. There is a none present amount of drainage noted. The wound margin is distinct with the outline attached to the wound base. There is no granulation within the wound bed. There is a large (67-100%) amount of necrotic tissue within the wound bed including Eschar and Adherent Slough. 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 Type 2 diabetes mellitus with other skin ulcer Chronic combined systolic (congestive) and diastolic (congestive) heart failure Atherosclerotic heart disease of native coronary artery with unspecified angina pectoris Chronic venous hypertension (idiopathic) with ulcer of right lower extremity Patient's wound has shown improvement in size in appearance since last clinic visit. I debrided nonviable tissue. I recommended continuing the course with Hydrofera Blue and Keystone under compression therapy. We have been using Kerlix/Coban. He would likely benefit from a higher compression. We will go to 3 layer. Follow-up in 1 week. He does have home health that changes the dressing Once a week as well. Procedures Wound #1 Pre-procedure diagnosis of Wound #1 is a Diabetic Wound/Ulcer of the Lower Extremity located on the Right,Lateral Lower Leg .Severity of  Tissue Pre Debridement is: Fat layer exposed. There was a Excisional Skin/Subcutaneous Tissue Debridement with a total area of 104 sq cm performed by Kalman Shan, DO. With the following instrument(s): Curette to remove Viable and Non-Viable tissue/material. Material removed includes Subcutaneous Tissue, Slough, and Fibrin/Exudate after achieving pain control using Lidocaine 5% topical ointment. A time out was conducted at 11:30, prior to the start of the procedure. A Minimum amount of bleeding was controlled with Pressure. The procedure was tolerated well with a pain level of 0 throughout and a pain level of 0 following the procedure. Post Debridement Measurements: 13.7cm length x 8.5cm width x 0.2cm depth; 18.292cm^3 volume. Character of Wound/Ulcer Post Debridement requires further debridement. Severity of Tissue Post Debridement is: Fat layer exposed. Post procedure Diagnosis Wound #1: Same as Pre-Procedure Plan Follow-up Appointments: Return Appointment in 1 week. - Dr. Heber [place] and [place], Room 8 Monday 1115 01/02/2022 Return Appointment in 2 weeks. - Dr. Heber [place] and [place], Room 8 Monday 1115 01/09/2022 Other: - ****[facility] topical compounding antibiotics bring to appt each time.**** Anesthetic: (In clinic) Topical Lidocaine 5% applied to wound bed Cellular or Tissue Based Products: Cellular or Tissue Based Product Type: - run  insurance for [employer], epicord, and organogensis advance tissue product. Bathing/ Shower/ Hygiene: May shower with protection but do not get wound dressing(s) wet. Edema Control - Lymphedema / SCD / Other: Elevate legs to the level of the heart or above for 30 minutes daily and/or when sitting, a frequency of: - 3-4 times a day throughout the day. Avoid standing for long periods of time. Home Health: New wound care orders this week; continue Home Health for wound care. May utilize formulary equivalent dressing for wound treatment orders  unless otherwise specified. - home health to twice a week. Compounding topical antibiotics directly to wound bed and then apply [place]. DO NOT USE BORDERED FOAM. increasing the compression to 3 layer. Other Home Health Orders/Instructions: - [facility] The following medication(s) was prescribed: lidocaine topical 5 % gel gel topical applied only for any debridement at clinic appt times. was prescribed at facility WOUND #1: - Lower Leg Wound Laterality: Right, Lateral Cleanser: Soap and Water (Home Health) 3 x Per Week/30 Days Discharge Instructions: May shower and wash wound with dial antibacterial soap and water prior to dressing change. Cleanser: Wound Cleanser (Home Health) 3 x Per Week/30 Days Discharge Instructions: Cleanse the wound with wound cleanser prior to applying a clean dressing using gauze sponges, not tissue or cotton balls. Prim Dressing: Hydrofera Blue Classic Foam, 4x4 in (Home Health) 3 x Per Week/30 Days ary Discharge Instructions: Moisten with saline prior to apply OVER THE KEYSTONE. Prim Dressing: Compounding T ary opical antibiotics (Home Health) 3 x Per Week/30 Days Discharge Instructions: apply the [facility] compounding topical antibiotics directly to wound bed. Secondary Dressing: ABD Pad, 8x10 (Home Health) 3 x Per Week/30 Days Discharge Instructions: Apply over primary dressing as directed. Secondary Dressing: CarboFLEX Odor Control Dressing, 4x4 in 3 x Per Week/30 Days Discharge Instructions: Apply over primary dressing as directed. Secondary Dressing: Woven Gauze Sponge, Non-Sterile 4x4 in (Home Health) 3 x Per Week/30 Days Discharge Instructions: Apply over primary dressing as directed. Secondary Dressing: Zetuvit Plus 4x8 in (Home Health) 3 x Per Week/30 Days Discharge Instructions: Apply over primary dressing as directed. Com pression Wrap: ThreePress (3 layer compression wrap) 3 x Per Week/30 Days Discharge Instructions:  Apply three layer compression as directed. 1. In office sharp debridement 2. Hydrofera Blue with [facility] under 3 layer compression 3. Follow-up in 1 week Electronic Signature(s) Signed: 12/26/2021 12:18:02 PM By: Kalman Shan DO Entered By: Kalman Shan on 12/26/2021 11:54:44 -------------------------------------------------------------------------------- HxROS Details Patient Name: Date of Service: Corey [name], Corey [name]. 12/26/2021 11:15 A [name] Medical Record Number: [id number] Patient Account Number: [id number] Date of Birth/Sex: Treating RN: [birth year] (76 y.o. [name] Primary Care Provider: Billey Gosling Other Clinician: Referring Provider: Treating Provider/Extender: [name] in Treatment: 7 Constitutional Symptoms (General Health) Medical History: Past Medical History Notes: CVA 2014 bilateral hearing loss Eyes Medical History: Positive for: Cataracts Respiratory Medical History: Positive for: Sleep Apnea - has CPAP does not wear Cardiovascular Medical History: Positive for: Congestive Heart Failure; Hypertension; Myocardial Infarction - 2014 Gastrointestinal Medical History: Past Medical History Notes: Diverticulosis Endocrine Medical History: Positive for: Type II Diabetes Time with diabetes: >20 years Treated with: Oral agents, Diet Blood sugar tested every day: Yes Tested : BID Genitourinary Medical History: Past Medical History Notes: Renal insufficiency 20/ Musculoskeletal Medical History: Positive for: Gout Neurologic Medical History: Negative for: Seizure Disorder Oncologic Medical History: Past Medical History Notes: Prostate Ca HBO Extended History Items Eyes: Cataracts Immunizations  Pneumococcal Vaccine: Received Pneumococcal Vaccination: No Implantable Devices No devices added Hospitalization / Surgery History Type of Hospitalization/Surgery 06/2021 inguinal hernia repair cardioversion  09/2020 ventral hernia repair 3/19 Family and Social History Cancer: Yes - Father; Diabetes: Yes - Mother,Father; Heart Disease: Yes - Mother,Siblings; Hypertension: Yes - Mother,Father; Former smoker - quit 1970; Marital Status - Married; Drug Use: No History; Caffeine Use: Moderate - coffee; Financial Concerns: No; Food, Clothing or Shelter Needs: No; Support System Lacking: No; Transportation Concerns: No Electronic Signature(s) Signed: 12/26/2021 12:18:02 PM By: Kalman Shan DO Signed: 12/26/2021 4:35:20 PM By: Deon Pilling RN, BSN Entered By: Kalman Shan on 12/26/2021 11:53:25 -------------------------------------------------------------------------------- SuperBill Details Patient Name: Date of Service: Corey [name], Corey [name]. 12/26/2021 Medical Record Number: [id number] Patient Account Number: [id number] Date of Birth/Sex: Treating RN: 09-17-1945 (76 y.o. 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 E11.622 Type 2 diabetes mellitus with other skin ulcer I50.42 Chronic combined systolic (congestive) and diastolic (congestive) heart failure I25.119 Atherosclerotic heart disease of native coronary artery with unspecified angina pectoris I87.311 Chronic venous hypertension (idiopathic) with ulcer of right lower extremity Facility Procedures CPT4 Code: 17001749 Description: 44967 - DEB SUBQ TISSUE 20 SQ CM/< ICD-10 Diagnosis Description L97.918 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 with other spe Modifier: cified severity Quantity: 1 CPT4 Code: [id number] Description: 65993 - DEB SUBQ TISS EA ADDL 20CM ICD-10 Diagnosis Description L97.918 Non-pressure chronic ulcer of unspecified  part of right lower leg with other spe Modifier: cified severity Quantity: 5 Physician Procedures : CPT4 Code Description Modifier [id number] 11042 - WC PHYS SUBQ TISS 20 SQ CM ICD-10 Diagnosis Description L97.918 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

## 2021-12-30 NOTE — Progress Notes (Signed)
Corey [name], STOCKS ([id number]) Visit Report for 12/26/2021 Arrival Information Details Patient Name: Date of Service: Corey [name]. 12/26/2021 11:15 A [name] Medical Record Number: [id number] Patient Account Number: [id number] Date of Birth/Sex: Treating RN: [birth year] (76 y.o. Corey [name] Primary Care Karlin Heilman: Billey Gosling Other Clinician: Referring Cashlyn Huguley: Treating Emilio Baylock/Extender: Mickle Asper in Treatment: 7 Visit Information History Since Last Visit Added or deleted any medications: No Patient Arrived: Ambulatory Any new allergies or adverse reactions: No Arrival Time: 10:58 Had a fall or experienced change in No Accompanied By: self activities of daily living that may affect Transfer Assistance: None risk of falls: Patient Identification Verified: Yes Signs or symptoms of abuse/neglect since last visito No Secondary Verification Process Completed: Yes Hospitalized since last visit: No Patient Requires Transmission-Based No Implantable device outside of the clinic excluding No Precautions: cellular tissue based products placed in the center Patient Has Alerts: Yes since last visit: Patient Alerts: in clinic R [place] Has Dressing in Place as Prescribed: Yes 11/17/2021 ABIs: both [place] Has Compression in Place as Prescribed: Yes 11/17/2021 TBI: R0.64 L0.58 Pain Present Now: Yes Electronic Signature(s) Signed: 12/26/2021 3:10:00 PM By: Erenest Blank Entered By: Erenest Blank on 12/26/2021 10:58:29 -------------------------------------------------------------------------------- Compression Therapy Details Patient Name: Date of Service: Corey [name]. 12/26/2021 11:15 A [name] Medical Record Number: [id number] Patient Account Number: [id number] Date of Birth/Sex: Treating RN: 1945/12/30 (76 y.o. Corey [name] Primary Care Gwynn Chalker: Billey Gosling Other Clinician: Referring Mi Balla: Treating Janith Nielson/Extender: [name] in  Treatment: 7 Compression Therapy Performed for Wound Assessment: Wound #1 Right,Lateral Lower Leg Performed By: Clinician Deon Pilling, RN Compression Type: Three Layer Post Procedure Diagnosis Same as Pre-procedure Electronic Signature(s) Signed: 12/26/2021 4:35:20 PM By: Deon Pilling RN, BSN Entered By: Deon Pilling on 12/26/2021 14:57:12 -------------------------------------------------------------------------------- Encounter Discharge Information Details Patient Name: Date of Service: Corey [name]. 12/26/2021 11:15 A [name] Medical Record Number: [id number] Patient Account Number: [id number] Date of Birth/Sex: Treating RN: 1945-12-19 (76 y.o. Corey [name] Primary Care Rodrigo Mcgranahan: Billey Gosling Other Clinician: Referring Maurene Hollin: Treating Garlan Drewes/Extender: Mickle Asper in Treatment: 7 Encounter Discharge Information Items Post Procedure Vitals Discharge Condition: Stable Temperature (F): 97.8 Ambulatory Status: Ambulatory Pulse (bpm): 44 Discharge Destination: Home Respiratory Rate (breaths/min): 20 Transportation: Private Auto Blood Pressure (mmHg): 154/75 Accompanied By: self Schedule Follow-up Appointment: Yes Clinical Summary of Care: Electronic Signature(s) Signed: 12/26/2021 4:35:20 PM By: Deon Pilling RN, BSN Entered By: Deon Pilling on 12/26/2021 11:45:34 -------------------------------------------------------------------------------- Lower Extremity Assessment Details Patient Name: Date of Service: Corey [name]. 12/26/2021 11:15 A [name] Medical Record Number: [id number] Patient Account Number: [id number] Date of Birth/Sex: Treating RN: 12/05/1945 (76 y.o. Corey [name] Primary Care Haylee Mcanany: Billey Gosling Other Clinician: Referring Albertina Leise: Treating Shaeley Segall/Extender: [name] in Treatment: 7 Edema Assessment Assessed: [Left: No] [Right: No] Edema: [Left: N] [Right: o] Calf Left: Right: Point  of Measurement: 33 cm From Medial Instep 37 cm Ankle Left: Right: Point of Measurement: 11 cm From Medial Instep 22.4 cm Vascular Assessment Pulses: Dorsalis Pedis Palpable: [Right:Yes] Electronic Signature(s) Signed: 12/26/2021 3:10:00 PM By: Erenest Blank Signed: 12/26/2021 4:35:20 PM By: Deon Pilling RN, BSN Entered By: Erenest Blank on 12/26/2021 11:14:56 -------------------------------------------------------------------------------- Multi Wound Chart Details Patient Name: Date of Service: Corey [name]. 12/26/2021 11:15 A [name] Medical Record Number: [id number] Patient Account Number: [id number] Date of Birth/Sex: Treating RN: 22-Dec-1945 (76 y.o.  Corey [name] Primary Care Casandra Dallaire: Billey Gosling Other Clinician: Referring Nyelli Samara: Treating Samie Barclift/Extender: [name] in Treatment: 7 Vital Signs Height(in): 66 Pulse(bpm): 40 Weight(lbs): 145 Blood Pressure(mmHg): 154/85 Body Mass Index(BMI): 23.4 Temperature(F): 97.8 Respiratory Rate(breaths/min): 17 Photos: [N/A:N/A] Right, Lateral Lower Leg N/A N/A Wound Location: Gradually Appeared N/A N/A Wounding Event: Diabetic Wound/Ulcer of the Lower N/A N/A Primary Etiology: Extremity Cataracts, Sleep Apnea, Congestive N/A N/A Comorbid History: Heart Failure, Hypertension, Myocardial Infarction, Type II Diabetes, Gout 08/15/2021 N/A N/A Date Acquired: 7 N/A N/A Weeks of Treatment: Open N/A N/A Wound Status: No N/A N/A Wound Recurrence: 13.7x8.5x0.2 N/A N/A Measurements L x W x D (cm) 91.46 N/A N/A A (cm) : rea 18.292 N/A N/A Volume (cm) : -323.50% N/A N/A % Reduction in A rea: -746.90% N/A N/A % Reduction in Volume: Grade 2 N/A N/A Classification: None Present N/A N/A Exudate A mount: Distinct, outline attached N/A N/A Wound Margin: None Present (0%) N/A N/A Granulation A mount: Large (67-100%) N/A N/A Necrotic A mount: Eschar, Adherent Slough N/A N/A Necrotic  Tissue: Fat Layer (Subcutaneous Tissue): Yes N/A N/A Exposed Structures: Fascia: No Tendon: No Muscle: No Joint: No Bone: No Small (1-33%) N/A N/A Epithelialization: Debridement - Excisional N/A N/A Debridement: Pre-procedure Verification/Time Out 11:30 N/A N/A Taken: Lidocaine 5% topical ointment N/A N/A Pain Control: Subcutaneous, Slough N/A N/A Tissue Debrided: Skin/Subcutaneous Tissue N/A N/A Level: 104 N/A N/A Debridement A (sq cm): rea Curette N/A N/A Instrument: Minimum N/A N/A Bleeding: Pressure N/A N/A Hemostasis A chieved: 0 N/A N/A Procedural Pain: 0 N/A N/A Post Procedural Pain: Procedure was tolerated well N/A N/A Debridement Treatment Response: 13.7x8.5x0.2 N/A N/A Post Debridement Measurements L x W x D (cm) 18.292 N/A N/A Post Debridement Volume: (cm) Debridement N/A N/A Procedures Performed: Treatment Notes Wound #1 (Lower Leg) Wound Laterality: Right, Lateral Cleanser Soap and Water Discharge Instruction: May shower and wash wound with dial antibacterial soap and water prior to dressing change. Wound Cleanser Discharge Instruction: Cleanse the wound with wound cleanser prior to applying a clean dressing using gauze sponges, not tissue or cotton balls. Peri-Wound Care Topical Primary Dressing Hydrofera Blue Classic Foam, 4x4 in Discharge Instruction: Moisten with saline prior to apply OVER THE KEYSTONE. Compounding T opical antibiotics Discharge Instruction: apply the [facility] compounding topical antibiotics directly to wound bed. Secondary Dressing ABD Pad, 8x10 Discharge Instruction: Apply over primary dressing as directed. CarboFLEX Odor Control Dressing, 4x4 in Discharge Instruction: Apply over primary dressing as directed. Woven Gauze Sponge, Non-Sterile 4x4 in Discharge Instruction: Apply over primary dressing as directed. Zetuvit Plus 4x8 in Discharge Instruction: Apply over primary dressing as directed. Secured  With Compression Wrap ThreePress (3 layer compression wrap) Discharge Instruction: Apply three layer compression as directed. Compression Stockings Add-Ons Electronic Signature(s) Signed: 12/26/2021 12:18:02 PM By: Kalman Shan DO Signed: 12/26/2021 4:35:20 PM By: Deon Pilling RN, BSN Entered By: Kalman Shan on 12/26/2021 11:52:39 -------------------------------------------------------------------------------- [place] Details Patient Name: Date of Service: Corey [name]. 12/26/2021 11:15 A [name] Medical Record Number: [id number] Patient Account Number: [id number] Date of Birth/Sex: Treating RN: 1945-09-20 (76 y.o. Corey [name] Primary Care Brystol Wasilewski: Billey Gosling Other Clinician: Referring Shawnta Zimbelman: Treating Jamian Andujo/Extender: Mickle Asper in Treatment: 7 Active Inactive Necrotic Tissue Nursing Diagnoses: Knowledge deficit related to management of necrotic/devitalized tissue Goals: Necrotic/devitalized tissue will be minimized in the wound bed Date Initiated: 11/01/2021 Target Resolution Date: 01/13/2022 Goal Status: Active Patient/caregiver will verbalize understanding of reason  and process for debridement of necrotic tissue Date Initiated: 11/01/2021 Date Inactivated: 12/05/2021 Target Resolution Date: 12/09/2021 Goal Status: Met Interventions: Assess patient pain level pre-, during and post procedure and prior to discharge Provide education on necrotic tissue and debridement process Treatment Activities: Apply topical anesthetic as ordered : 11/01/2021 Enzymatic debridement : 11/01/2021 Excisional debridement : 11/01/2021 Notes: Pain, Acute or Chronic Nursing Diagnoses: Pain, acute or chronic: actual or potential Potential alteration in comfort, pain Goals: Patient will verbalize adequate pain control and receive pain control interventions during procedures as needed Date Initiated: 11/01/2021 Date Inactivated:  11/21/2021 Target Resolution Date: 12/09/2021 Goal Status: Met Patient/caregiver will verbalize comfort level met Date Initiated: 11/01/2021 Target Resolution Date: 01/13/2022 Goal Status: Active Interventions: Complete pain assessment as per visit requirements Provide education on pain management Treatment Activities: Administer pain control measures as ordered : 11/01/2021 Notes: Electronic Signature(s) Signed: 12/26/2021 4:35:20 PM By: Deon Pilling RN, BSN Entered By: Deon Pilling on 12/26/2021 11:27:08 -------------------------------------------------------------------------------- Pain Assessment Details Patient Name: Date of Service: Corey [name]. 12/26/2021 11:15 A [name] Medical Record Number: [id number] Patient Account Number: [id number] Date of Birth/Sex: Treating RN: 30-Oct-1945 (76 y.o. Corey [name] Primary Care Masashi Snowdon: Billey Gosling Other Clinician: Referring Lasonia Casino: Treating Eriyah Fernando/Extender: Mickle Asper in Treatment: 7 Active Problems Location of Pain Severity and Description of Pain Patient Has Paino Yes Site Locations Pain Location: Pain Location: Pain in Ulcers Rate the pain. Current Pain Level: 4 Pain Management and Medication Current Pain Management: Electronic Signature(s) Signed: 12/26/2021 3:10:00 PM By: Erenest Blank Signed: 12/26/2021 4:35:20 PM By: Deon Pilling RN, BSN Entered By: Erenest Blank on 12/26/2021 10:58:59 -------------------------------------------------------------------------------- Patient/Caregiver Education Details Patient Name: Date of Service: Corey [name] 8/14/2023andnbsp11:15 A [name] Medical Record Number: [id number] Patient Account Number: [id number] Date of Birth/Gender: Treating RN: 01-Oct-1945 (76 y.o. Corey [name] Primary Care Physician: Billey Gosling Other Clinician: Referring Physician: Treating Physician/Extender: Mickle Asper in Treatment: 7 Education  Assessment Education Provided To: Patient Education Topics Provided Wound Debridement: Handouts: Wound Debridement Methods: Explain/Verbal Responses: Reinforcements needed Electronic Signature(s) Signed: 12/26/2021 4:35:20 PM By: Deon Pilling RN, BSN Entered By: Deon Pilling on 12/26/2021 11:27:17 -------------------------------------------------------------------------------- Wound Assessment Details Patient Name: Date of Service: Corey [name]. 12/26/2021 11:15 A [name] Medical Record Number: [id number] Patient Account Number: [id number] Date of Birth/Sex: Treating RN: 1946-01-11 (76 y.o. Corey [name] Primary Care Cia Garretson: Billey Gosling Other Clinician: Referring Nhan Qualley: Treating Anjanae Woehrle/Extender: [name] in Treatment: 7 Wound Status Wound Number: 1 Primary Diabetic Wound/Ulcer of the Lower Extremity Etiology: Wound Location: Right, Lateral Lower Leg Wound Open Wounding Event: Gradually Appeared Status: Date Acquired: 08/15/2021 Comorbid Cataracts, Sleep Apnea, Congestive Heart Failure, Hypertension, Weeks Of Treatment: 7 History: Myocardial Infarction, Type II Diabetes, Gout Clustered Wound: No Photos Wound Measurements Length: (cm) 13.7 Width: (cm) 8.5 Depth: (cm) 0.2 Area: (cm) 91.46 Volume: (cm) 18.292 % Reduction in Area: -323.5% % Reduction in Volume: -746.9% Epithelialization: Small (1-33%) Tunneling: No Undermining: No Wound Description Classification: Grade 2 Wound Margin: Distinct, outline attached Exudate Amount: None Present Foul Odor After Cleansing: No Slough/Fibrino Yes Wound Bed Granulation Amount: None Present (0%) Exposed Structure Necrotic Amount: Large (67-100%) Fascia Exposed: No Necrotic Quality: Eschar, Adherent Slough Fat Layer (Subcutaneous Tissue) Exposed: Yes Tendon Exposed: No Muscle Exposed: No Joint Exposed: No Bone Exposed: No Treatment Notes Wound #1 (Lower Leg) Wound Laterality: Right,  Lateral Cleanser Soap and Water Discharge Instruction: May  shower and wash wound with dial antibacterial soap and water prior to dressing change. Wound Cleanser Discharge Instruction: Cleanse the wound with wound cleanser prior to applying a clean dressing using gauze sponges, not tissue or cotton balls. Peri-Wound Care Topical Primary Dressing Hydrofera Blue Classic Foam, 4x4 in Discharge Instruction: Moisten with saline prior to apply OVER THE KEYSTONE. Compounding T opical antibiotics Discharge Instruction: apply the [facility] compounding topical antibiotics directly to wound bed. Secondary Dressing ABD Pad, 8x10 Discharge Instruction: Apply over primary dressing as directed. CarboFLEX Odor Control Dressing, 4x4 in Discharge Instruction: Apply over primary dressing as directed. Woven Gauze Sponge, Non-Sterile 4x4 in Discharge Instruction: Apply over primary dressing as directed. Zetuvit Plus 4x8 in Discharge Instruction: Apply over primary dressing as directed. Secured With Compression Wrap ThreePress (3 layer compression wrap) Discharge Instruction: Apply three layer compression as directed. Compression Stockings Add-Ons Electronic Signature(s) Signed: 12/26/2021 3:10:00 PM By: Erenest Blank Signed: 12/26/2021 4:35:20 PM By: Deon Pilling RN, BSN Entered By: Erenest Blank on 12/26/2021 11:15:37 -------------------------------------------------------------------------------- Vitals Details Patient Name: Date of Service: Corey [name]. 12/26/2021 11:15 A [name] Medical Record Number: [id number] Patient Account Number: [id number] Date of Birth/Sex: Treating RN: [birth year] (76 y.o. Corey [name] Primary Care Benjamen Koelling: Billey Gosling Other Clinician: Referring Jedi Catalfamo: Treating Hope Holst/Extender: [name] in Treatment: 7 Vital Signs Time Taken: 10:58 Temperature (F): 97.8 Height (in): 66 Pulse (bpm): 40 Weight (lbs):  145 Respiratory Rate (breaths/min): 17 Body Mass Index (BMI): 23.4 Blood Pressure (mmHg): 154/85 Reference Range: 80 - 120 mg / dl Electronic Signature(s) Signed: 12/26/2021 3:10:00 PM By: Erenest Blank Entered By: Erenest Blank on 12/26/2021 10:58:49

## 2022-01-02 ENCOUNTER — Encounter (HOSPITAL_BASED_OUTPATIENT_CLINIC_OR_DEPARTMENT_OTHER): Payer: Medicare HMO | Admitting: Internal Medicine

## 2022-01-02 ENCOUNTER — Other Ambulatory Visit: Payer: Self-pay | Admitting: Endocrinology

## 2022-01-02 DIAGNOSIS — L97918 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 with other specified severity: Secondary | ICD-10-CM | POA: Diagnosis not present

## 2022-01-02 DIAGNOSIS — I130 Hypertensive heart and chronic kidney disease with heart failure and stage 1 through stage 4 chronic kidney disease, or unspecified chronic kidney disease: Secondary | ICD-10-CM | POA: Diagnosis not present

## 2022-01-02 DIAGNOSIS — I25119 Atherosclerotic heart disease of native coronary artery with unspecified angina pectoris: Secondary | ICD-10-CM | POA: Diagnosis not present

## 2022-01-02 DIAGNOSIS — N184 Chronic kidney disease, stage 4 (severe): Secondary | ICD-10-CM | POA: Diagnosis not present

## 2022-01-02 DIAGNOSIS — E1165 Type 2 diabetes mellitus with hyperglycemia: Secondary | ICD-10-CM

## 2022-01-02 DIAGNOSIS — I5042 Chronic combined systolic (congestive) and diastolic (congestive) heart failure: Secondary | ICD-10-CM | POA: Diagnosis not present

## 2022-01-02 DIAGNOSIS — E11622 Type 2 diabetes mellitus with other skin ulcer: Secondary | ICD-10-CM | POA: Diagnosis not present

## 2022-01-02 DIAGNOSIS — I87311 Chronic venous hypertension (idiopathic) with ulcer of right lower extremity: Secondary | ICD-10-CM | POA: Diagnosis not present

## 2022-01-02 DIAGNOSIS — R6889 Other general symptoms and signs: Secondary | ICD-10-CM | POA: Diagnosis not present

## 2022-01-02 NOTE — Progress Notes (Signed)
TRAVANTE, KNEE ([id number]) Visit Report for 01/02/2022 Arrival Information Details Patient Name: Date of Service: Corey [name]. 01/02/2022 11:15 A M Medical Record Number: [id number] Patient Account Number: [id number] Date of Birth/Sex: Treating RN: 1945-07-12 (76 y.o. M) Primary Care Shanena Pellegrino: Billey Gosling Other Clinician: Referring Jandi Swiger: Treating Tecora Eustache/Extender: Mickle Asper in Treatment: 8 Visit Information History Since Last Visit Added or deleted any medications: No Patient Arrived: Ambulatory Any new allergies or adverse reactions: No Arrival Time: 11:11 Had a fall or experienced change in No Accompanied By: self activities of daily living that may affect Transfer Assistance: None risk of falls: Patient Identification Verified: Yes Signs or symptoms of abuse/neglect since last visito No Secondary Verification Process Completed: Yes Hospitalized since last visit: No Patient Requires Transmission-Based No Implantable device outside of the clinic excluding No Precautions: cellular tissue based products placed in the center Patient Has Alerts: Yes since last visit: Patient Alerts: in clinic R [place] Has Compression in Place as Prescribed: Yes 11/17/2021 ABIs: both Venturia Pain Present Now: Yes 11/17/2021 TBI: R0.64 L0.58 Electronic Signature(s) Signed: 01/02/2022 4:55:34 PM By: Erenest Blank Entered By: Erenest Blank on 01/02/2022 11:12:10 -------------------------------------------------------------------------------- Compression Therapy Details Patient Name: Date of Service: Corey [name], [name] 01/02/2022 11:15 A M Medical Record Number: [id number] Patient Account Number: [id number] Date of Birth/Sex: Treating RN: 1945/11/29 (76 y.o. [name] Primary Care Fadil Macmaster: Billey Gosling Other Clinician: Referring Uriel Horkey: Treating Freada Twersky/Extender: [name] in Treatment: 8 Compression Therapy Performed for Wound  Assessment: Wound #1 Right,Lateral Lower Leg Performed By: Clinician Deon Pilling, RN Compression Type: Three Layer Post Procedure Diagnosis Same as Pre-procedure Electronic Signature(s) Signed: 01/02/2022 4:49:28 PM By: Deon Pilling RN, BSN Entered By: Deon Pilling on 01/02/2022 11:53:45 -------------------------------------------------------------------------------- Encounter Discharge Information Details Patient Name: Date of Service: Corey [name], [name] 01/02/2022 11:15 A M Medical Record Number: [id number] Patient Account Number: [id number] Date of Birth/Sex: Treating RN: 12/10/1945 (76 y.o. [name] Primary Care Deziah Renwick: Billey Gosling Other Clinician: Referring Jaheem Hedgepath: Treating Andersen Iorio/Extender: Mickle Asper in Treatment: 8 Encounter Discharge Information Items Post Procedure Vitals Discharge Condition: Stable Temperature (F): 97.8 Ambulatory Status: Ambulatory Pulse (bpm): 55 Discharge Destination: Home Respiratory Rate (breaths/min): 18 Transportation: Private Auto Blood Pressure (mmHg): 154/82 Accompanied By: self Schedule Follow-up Appointment: Yes Clinical Summary of Care: Electronic Signature(s) Signed: 01/02/2022 4:49:28 PM By: Deon Pilling RN, BSN Entered By: Deon Pilling on 01/02/2022 12:20:35 -------------------------------------------------------------------------------- Lower Extremity Assessment Details Patient Name: Date of Service: Corey [name], [name] 01/02/2022 11:15 A M Medical Record Number: [id number] Patient Account Number: [id number] Date of Birth/Sex: Treating RN: [birth year] (76 y.o. M) Primary Care Kinsley Holderman: Billey Gosling Other Clinician: Referring Trafton Roker: Treating Jezabella Schriever/Extender: [name] in Treatment: 8 Edema Assessment Assessed: [Left: No] [Right: No] Edema: [Left: N] [Right: o] Calf Left: Right: Point of Measurement: 33 cm From Medial Instep 34.4 cm Ankle Left:  Right: Point of Measurement: 11 cm From Medial Instep 23.3 cm Electronic Signature(s) Signed: 01/02/2022 4:55:34 PM By: Erenest Blank Entered By: Erenest Blank on 01/02/2022 11:29:56 -------------------------------------------------------------------------------- Multi Wound Chart Details Patient Name: Date of Service: Corey [name], [name] 01/02/2022 11:15 A M Medical Record Number: [id number] Patient Account Number: [id number] Date of Birth/Sex: Treating RN: 04-Feb-1946 (76 y.o. M) Primary Care Sajjad Honea: Billey Gosling Other Clinician: Referring Zalayah Pizzuto: Treating Velencia Lenart/Extender: [name] in Treatment: 8 Vital Signs Height(in): 66 Pulse(bpm): 55 Weight(lbs):  145 Blood Pressure(mmHg): 154/82 Body Mass Index(BMI): 23.4 Temperature(F): 97.8 Respiratory Rate(breaths/min): 18 Photos: [N/A:N/A] Right, Lateral Lower Leg N/A N/A Wound Location: Gradually Appeared N/A N/A Wounding Event: Diabetic Wound/Ulcer of the Lower N/A N/A Primary Etiology: Extremity Cataracts, Sleep Apnea, Congestive N/A N/A Comorbid History: Heart Failure, Hypertension, Myocardial Infarction, Type II Diabetes, Gout 08/15/2021 N/A N/A Date Acquired: 8 N/A N/A Weeks of Treatment: Open N/A N/A Wound Status: No N/A N/A Wound Recurrence: 14x8.7x0.2 N/A N/A Measurements L x W x D (cm) 95.661 N/A N/A A (cm) : rea 19.132 N/A N/A Volume (cm) : -342.90% N/A N/A % Reduction in A rea: -785.70% N/A N/A % Reduction in Volume: Grade 2 N/A N/A Classification: None Present N/A N/A Exudate A mount: Distinct, outline attached N/A N/A Wound Margin: None Present (0%) N/A N/A Granulation A mount: Large (67-100%) N/A N/A Necrotic A mount: Eschar, Adherent Slough N/A N/A Necrotic Tissue: Fat Layer (Subcutaneous Tissue): Yes N/A N/A Exposed Structures: Fascia: No Tendon: No Muscle: No Joint: No Bone: No Small (1-33%) N/A N/A Epithelialization: Debridement - Excisional  N/A N/A Debridement: Pre-procedure Verification/Time Out 12:10 N/A N/A Taken: Lidocaine 5% topical ointment N/A N/A Pain Control: Necrotic/Eschar, Subcutaneous, N/A N/A Tissue Debrided: Slough Skin/Subcutaneous Tissue N/A N/A Level: 121.8 N/A N/A Debridement A (sq cm): rea Curette N/A N/A Instrument: Minimum N/A N/A Bleeding: Pressure N/A N/A Hemostasis Achieved: 0 N/A N/A Procedural Pain: 0 N/A N/A Post Procedural Pain: Debridement Treatment Response: Procedure was tolerated well N/A N/A Post Debridement Measurements L x 14x8.7x0.2 N/A N/A W x D (cm) 19.132 N/A N/A Post Debridement Volume: (cm) Compression Therapy N/A N/A Procedures Performed: Debridement Treatment Notes Wound #1 (Lower Leg) Wound Laterality: Right, Lateral Cleanser Soap and Water Discharge Instruction: May shower and wash wound with dial antibacterial soap and water prior to dressing change. Wound Cleanser Discharge Instruction: Cleanse the wound with wound cleanser prior to applying a clean dressing using gauze sponges, not tissue or cotton balls. Peri-Wound Care Topical powder collagen and blastx Discharge Instruction: mix and apply over the topical antibiotics to wound. Primary Dressing Fibracol Plus Dressing, 4x4.38 in (collagen) Discharge Instruction: Moisten collagen with saline or hydrogel until the blastx and powder collagen arrive. Compounding T opical antibiotics Discharge Instruction: apply the [facility] compounding topical antibiotics directly to wound bed. Secondary Dressing ABD Pad, 8x10 Discharge Instruction: Apply over primary dressing as directed. Woven Gauze Sponge, Non-Sterile 4x4 in Discharge Instruction: Apply over primary dressing as directed. Zetuvit Plus 4x8 in Discharge Instruction: Apply over primary dressing as directed. Secured With Compression Wrap ThreePress (3 layer compression wrap) Discharge Instruction: Apply three layer compression as  directed. Compression Stockings Add-Ons Electronic Signature(s) Signed: 01/02/2022 2:26:36 PM By: Kalman Shan DO Entered By: Kalman Shan on 01/02/2022 13:18:27 -------------------------------------------------------------------------------- Brushy Details Patient Name: Date of Service: Corey [name], [name] 01/02/2022 11:15 A M Medical Record Number: [id number] Patient Account Number: [id number] Date of Birth/Sex: Treating RN: [birth year] (76 y.o. [name] Primary Care Tamakia Porto: Billey Gosling Other Clinician: Referring Sulay Brymer: Treating Raziyah Vanvleck/Extender: Mickle Asper in Treatment: 8 Active Inactive Necrotic Tissue Nursing Diagnoses: Knowledge deficit related to management of necrotic/devitalized tissue Goals: Necrotic/devitalized tissue will be minimized in the wound bed Date Initiated: 11/01/2021 Target Resolution Date: 01/13/2022 Goal Status: Active Patient/caregiver will verbalize understanding of reason and process for debridement of necrotic tissue Date Initiated: 11/01/2021 Date Inactivated: 12/05/2021 Target Resolution Date: 12/09/2021 Goal Status: Met Interventions: Assess patient pain level pre-, during and post procedure and  prior to discharge Provide education on necrotic tissue and debridement process Treatment Activities: Apply topical anesthetic as ordered : 11/01/2021 Enzymatic debridement : 11/01/2021 Excisional debridement : 11/01/2021 Notes: Pain, Acute or Chronic Nursing Diagnoses: Pain, acute or chronic: actual or potential Potential alteration in comfort, pain Goals: Patient will verbalize adequate pain control and receive pain control interventions during procedures as needed Date Initiated: 11/01/2021 Date Inactivated: 11/21/2021 Target Resolution Date: 12/09/2021 Goal Status: Met Patient/caregiver will verbalize comfort level met Date Initiated: 11/01/2021 Target Resolution Date: 01/13/2022 Goal  Status: Active Interventions: Complete pain assessment as per visit requirements Provide education on pain management Treatment Activities: Administer pain control measures as ordered : 11/01/2021 Notes: Electronic Signature(s) Signed: 01/02/2022 4:49:28 PM By: Deon Pilling RN, BSN Entered By: Deon Pilling on 01/02/2022 11:24:02 -------------------------------------------------------------------------------- Pain Assessment Details Patient Name: Date of Service: Corey [name], [name] 01/02/2022 11:15 A M Medical Record Number: [id number] Patient Account Number: [id number] Date of Birth/Sex: Treating RN: 10-24-1945 (76 y.o. M) Primary Care Rakeen Gaillard: Billey Gosling Other Clinician: Referring Lenell Mcconnell: Treating Yessenia Maillet/Extender: Mickle Asper in Treatment: 8 Active Problems Location of Pain Severity and Description of Pain Patient Has Paino Yes Site Locations Pain Location: Pain in Ulcers Rate the pain. Current Pain Level: 5 Pain Management and Medication Current Pain Management: Electronic Signature(s) Signed: 01/02/2022 4:55:34 PM By: Erenest Blank Entered By: Erenest Blank on 01/02/2022 11:12:42 -------------------------------------------------------------------------------- Patient/Caregiver Education Details Patient Name: Date of Service: Corey [name], Tor Netters 8/21/2023andnbsp11:15 A M Medical Record Number: [id number] Patient Account Number: [id number] Date of Birth/Gender: Treating RN: [birth year] (76 y.o. [name] Primary Care Physician: Billey Gosling Other Clinician: Referring Physician: Treating Physician/Extender: Mickle Asper in Treatment: 8 Education Assessment Education Provided To: Patient Education Topics Provided Wound Debridement: Handouts: Wound Debridement Methods: Explain/Verbal Responses: Reinforcements needed Electronic Signature(s) Signed: 01/02/2022 4:49:28 PM By: Deon Pilling RN, BSN Entered  By: Deon Pilling on 01/02/2022 11:24:13 -------------------------------------------------------------------------------- Wound Assessment Details Patient Name: Date of Service: Corey [name], [name] 01/02/2022 11:15 A M Medical Record Number: [id number] Patient Account Number: [id number] Date of Birth/Sex: Treating RN: 1945-12-20 (76 y.o. M) Primary Care Atia Haupt: Billey Gosling Other Clinician: Referring Angala Hilgers: Treating Brodi Kari/Extender: [name] in Treatment: 8 Wound Status Wound Number: 1 Primary Diabetic Wound/Ulcer of the Lower Extremity Etiology: Wound Location: Right, Lateral Lower Leg Wound Open Wounding Event: Gradually Appeared Status: Date Acquired: 08/15/2021 Comorbid Cataracts, Sleep Apnea, Congestive Heart Failure, Hypertension, Weeks Of Treatment: 8 History: Myocardial Infarction, Type II Diabetes, Gout Clustered Wound: No Photos Wound Measurements Length: (cm) 14 Width: (cm) 8.7 Depth: (cm) 0.2 Area: (cm) 95.661 Volume: (cm) 19.132 % Reduction in Area: -342.9% % Reduction in Volume: -785.7% Epithelialization: Small (1-33%) Tunneling: No Undermining: No Wound Description Classification: Grade 2 Wound Margin: Distinct, outline attached Exudate Amount: None Present Foul Odor After Cleansing: No Slough/Fibrino Yes Wound Bed Granulation Amount: None Present (0%) Exposed Structure Necrotic Amount: Large (67-100%) Fascia Exposed: No Necrotic Quality: Eschar, Adherent Slough Fat Layer (Subcutaneous Tissue) Exposed: Yes Tendon Exposed: No Muscle Exposed: No Joint Exposed: No Bone Exposed: No Treatment Notes Wound #1 (Lower Leg) Wound Laterality: Right, Lateral Cleanser Soap and Water Discharge Instruction: May shower and wash wound with dial antibacterial soap and water prior to dressing change. Wound Cleanser Discharge Instruction: Cleanse the wound with wound cleanser prior to applying a clean dressing using gauze sponges,  not tissue or cotton balls. Peri-Wound Care Topical powder collagen and blastx Discharge  Instruction: mix and apply over the topical antibiotics to wound. Primary Dressing Fibracol Plus Dressing, 4x4.38 in (collagen) Discharge Instruction: Moisten collagen with saline or hydrogel until the blastx and powder collagen arrive. Compounding T opical antibiotics Discharge Instruction: apply the [facility] compounding topical antibiotics directly to wound bed. Secondary Dressing ABD Pad, 8x10 Discharge Instruction: Apply over primary dressing as directed. Woven Gauze Sponge, Non-Sterile 4x4 in Discharge Instruction: Apply over primary dressing as directed. Zetuvit Plus 4x8 in Discharge Instruction: Apply over primary dressing as directed. Secured With Compression Wrap ThreePress (3 layer compression wrap) Discharge Instruction: Apply three layer compression as directed. Compression Stockings Add-Ons Electronic Signature(s) Signed: 01/02/2022 4:55:34 PM By: Erenest Blank Entered By: Erenest Blank on 01/02/2022 11:30:37 -------------------------------------------------------------------------------- Vitals Details Patient Name: Date of Service: Corey [name], [name] 01/02/2022 11:15 A M Medical Record Number: [id number] Patient Account Number: [id number] Date of Birth/Sex: Treating RN: 02-03-1946 (76 y.o. M) Primary Care Breeze Angell: Billey Gosling Other Clinician: Referring Meta Kroenke: Treating Emmaline Wahba/Extender: Mickle Asper in Treatment: 8 Vital Signs Time Taken: 11:12 Temperature (F): 97.8 Height (in): 66 Pulse (bpm): 55 Weight (lbs): 145 Respiratory Rate (breaths/min): 18 Body Mass Index (BMI): 23.4 Blood Pressure (mmHg): 154/82 Reference Range: 80 - 120 mg / dl Electronic Signature(s) Signed: 01/02/2022 4:55:34 PM By: Erenest Blank Entered By: Erenest Blank on 01/02/2022 11:12:31

## 2022-01-02 NOTE — Progress Notes (Signed)
Corey [name], Corey [name] ([id number]) Visit Report for 01/02/2022 Chief Complaint Document Details Patient Name: Date of Service: Corey [name] 01/02/2022 11:15 A [name] Medical Record Number: [id number] Patient Account Number: [id number] Date of Birth/Sex: Treating RN: 04-04-1946 (76 y.o. [name]) Primary Care Provider: Billey [name] Other Clinician: Referring Provider: Treating Provider/Extender: Corey [name] in Treatment: 8 Information Obtained from: Patient Chief Complaint 11/01/2021; right lower extremity wound Electronic Signature(s) Signed: 01/02/2022 2:26:36 PM By: Corey Shan DO Entered By: Corey [name] on 01/02/2022 13:18:35 -------------------------------------------------------------------------------- Debridement Details Patient Name: Date of Service: Corey [name], Corey [name]. 01/02/2022 11:15 A [name] Medical Record Number: [id number] Patient Account Number: [id number] Date of Birth/Sex: Treating RN: November 26, 1945 (76 y.o. Corey [name] Primary Care Provider: Billey [name] Other Clinician: Referring Provider: Treating Provider/Extender: Corey [name] in Treatment: 8 Debridement Performed for Assessment: Wound #1 Right,Lateral Lower Leg Performed By: Physician Corey Shan, DO Debridement Type: Debridement Severity of Tissue Pre Debridement: Fat layer exposed Level of Consciousness (Pre-procedure): Awake and Alert Pre-procedure Verification/Time Out Yes - 12:10 Taken: Start Time: 12:11 Pain Control: Lidocaine 5% topical ointment T Area Debrided (L x W): otal 14 (cm) x 8.7 (cm) = 121.8 (cm) Tissue and other material debrided: Viable, Non-Viable, Eschar, Slough, Subcutaneous, Skin: Dermis , Skin: Epidermis, Slough Level: Skin/Subcutaneous Tissue Debridement Description: Excisional Instrument: Curette Bleeding: Minimum Hemostasis Achieved: Pressure End Time: 12:19 Procedural Pain: 0 Post Procedural Pain: 0 Response to Treatment: Procedure  was tolerated well Level of Consciousness (Post- Awake and Alert procedure): Post Debridement Measurements of Total Wound Length: (cm) 14 Width: (cm) 8.7 Depth: (cm) 0.2 Volume: (cm) 19.132 Character of Wound/Ulcer Post Debridement: Improved Severity of Tissue Post Debridement: Limited to breakdown of skin Post Procedure Diagnosis Same as Pre-procedure Electronic Signature(s) Signed: 01/02/2022 2:26:36 PM By: Corey Shan DO Signed: 01/02/2022 4:49:28 PM By: Corey [name] Entered By: Corey [name] on 01/02/2022 12:19:20 -------------------------------------------------------------------------------- HPI Details Patient Name: Date of Service: Corey [name], Corey [name]. 01/02/2022 11:15 A [name] Medical Record Number: [id number] Patient Account Number: [id number] Date of Birth/Sex: Treating RN: [birth year] (76 y.o. [name]) Primary Care Provider: Billey [name] Other Clinician: Referring Provider: Treating Provider/Extender: Corey [name] in Treatment: 8 History of Present Illness HPI Description: Admission 11/01/2021 [name] Is a 76 year old male with a past medical history of of type 2 diabetes, coronary artery disease, stage IV kidney disease acute on chronic combined systolic and diastolic heart failure that presents to the clinic for a 61-monthhistory of right lower extremity wound. He states he is not sure how this started. It is completely eschared. He reports his wound has been stable for the past month. He has been keeping the area covered. He has mild chronic pain. He denies signs of infection. He states he has a history of lower extremity ulcers last being on the left leg. He could not tell me how this was treated in the past. 6/27; patient presents for follow-up. He is scheduled for his lower extremity arterial studies on July 6th. He has been using Medihoney to the wound bed. He has no issues or complaints today. He denies signs of infection. 7/6; patient  presents for follow-up. He has home health that reported the wound having odor and increased pain last week. He was sent to urgent care. He was started on doxycycline. He is continuing Medihoney. He has his scheduled ABIs w/TBIs today. 7/10; patient presents for follow-up. He had his arterial  studies done last week. He had triphasic waveforms throughout the right lower extremity. His TBI was 0.64. He has good pedal pulses. He is almost done with his course of antibiotics. He has been using mupirocin ointment to the wound bed. 7/17; patient presents for follow-up. He has been using Iodosorb under compression therapy. He has no issues or complaints today. He states he is receiving [facility] antibiotic tomorrow In the mail. 7/24; patient presents for follow-up. He has been doing Dakin's wet-to-dry dressings to the wound bed daily without issues. He currently denies signs of infection. He reports completing his course of doxycycline. He has his [facility] antibiotic with him today. 7/31; patient presents for follow-up. We have been using Iodoflex with [facility] under compression therapy. He has no issues or complaints today. He denies signs of infection. 8/7; patient presents for follow-up. We have been using Iodoflex with [facility] under compression therapy. Home health came out once last week to change the wrap. He has no issues or complaints today. He denies signs of infection. 8/14; patient presents for follow-up. We have been using [facility] with Hydrofera Blue under compression therapy. He has tolerated this well. He has no issues or complaints today. 8/21; patient presents for follow-up. We have been using [facility] with Hydrofera Blue under compression therapy. The Hydrofera Blue was stuck on tightly to the wound bed today. Other than that he has no issues or complaints. We have not heard back for insurance approval for skin sub. Electronic Signature(s) Signed: 01/02/2022 2:26:36 PM By: Corey Shan  DO Entered By: Corey [name] on 01/02/2022 13:23:05 -------------------------------------------------------------------------------- Physical Exam Details Patient Name: Date of Service: Corey [name], Corey [name]. 01/02/2022 11:15 A [name] Medical Record Number: [id number] Patient Account Number: [id number] Date of Birth/Sex: Treating RN: [birth year] (76 y.o. [name]) Primary Care Provider: Billey [name] Other Clinician: Referring Provider: Treating Provider/Extender: [name] in Treatment: 8 Constitutional respirations regular, non-labored and within target range for patient.. Cardiovascular 2+ dorsalis pedis/posterior tibialis pulses. Psychiatric pleasant and cooperative. Notes ght lower extremity: T the anterior aspect there is granulation tissue, nonviable tissue and tightly adhered necrotic tissue. No signs of surrounding infection. o Electronic Signature(s) Signed: 01/02/2022 2:26:36 PM By: Corey Shan DO Entered By: Corey [name] on 01/02/2022 13:20:03 -------------------------------------------------------------------------------- Physician Orders Details Patient Name: Date of Service: Corey [name], Corey [name]. 01/02/2022 11:15 A [name] Medical Record Number: [id number] Patient Account Number: [id number] Date of Birth/Sex: Treating RN: Sep 10, 1945 (76 y.o. Corey [name] Primary Care Provider: Billey [name] Other Clinician: Referring Provider: Treating Provider/Extender: Corey [name] in Treatment: 8 Verbal / Phone Orders: No Diagnosis Coding ICD-10 Coding Code Description Q59.563 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 with other specified severity E11.622 Type 2 diabetes mellitus with other skin ulcer I50.42 Chronic combined systolic (congestive) and diastolic (congestive) heart failure I25.119 Atherosclerotic heart disease of native coronary artery with unspecified angina pectoris I87.311 Chronic venous hypertension  (idiopathic) with ulcer of right lower extremity Follow-up Appointments ppointment in 1 week. - Dr. Heber [place] and [place], Room 8 Monday 1115 01/09/2022 Return A ppointment in 2 weeks. - Dr. Heber [place] and [place], Room 8 Monday 1045 01/17/2022 Return A Other: - ****[facility] topical compounding antibiotics bring to appt each time.**** Will place an order through [employer] for collagen powder and blastx. Anesthetic (In clinic) Topical Lidocaine 5% applied to wound bed Cellular or Tissue Based Products Cellular or Tissue Based Product Type: - run insurance for [employer], epicord, and  organogensis advance tissue product- pending. Bathing/ Shower/ Hygiene May shower with protection but do not get wound dressing(s) wet. Edema Control - Lymphedema / SCD / Other Elevate legs to the level of the heart or above for 30 minutes daily and/or when sitting, a frequency of: - 3-4 times a day throughout the day. Avoid standing for long periods of time. Home Health New wound care orders this week; continue Home Health for wound care. May utilize formulary equivalent dressing for wound treatment orders unless otherwise specified. - home health to twice a week. Compounding topical antibiotics directly to wound bed. Placing an through [employer] for collagen powder and blastx. You would apply the topical antibiotics first then mix the collagen powder and blastx together and apply over top. Use plain collagen over the topical antibiotics until the powder collagen and blastx arrive. Other Home Health Orders/Instructions: - [facility] Wound Treatment Wound #1 - Lower Leg Wound Laterality: Right, Lateral Cleanser: Soap and Water (Home Health) 3 x Per Week/30 Days Discharge Instructions: May shower and wash wound with dial antibacterial soap and water prior to dressing change. Cleanser: Wound Cleanser (Home Health) 3 x Per Week/30 Days Discharge Instructions: Cleanse the wound with wound cleanser prior  to applying a clean dressing using gauze sponges, not tissue or cotton balls. Topical: powder collagen and blastx (Home Health) 3 x Per Week/30 Days Discharge Instructions: mix and apply over the topical antibiotics to wound. Prim Dressing: Fibracol Plus Dressing, 4x4.38 in (collagen) (Home Health) 3 x Per Week/30 Days ary Discharge Instructions: Moisten collagen with saline or hydrogel until the blastx and powder collagen arrive. Prim Dressing: Compounding T ary opical antibiotics (Home Health) 3 x Per Week/30 Days Discharge Instructions: apply the [facility] compounding topical antibiotics directly to wound bed. Secondary Dressing: ABD Pad, 8x10 (Home Health) 3 x Per Week/30 Days Discharge Instructions: Apply over primary dressing as directed. Secondary Dressing: Woven Gauze Sponge, Non-Sterile 4x4 in (Home Health) 3 x Per Week/30 Days Discharge Instructions: Apply over primary dressing as directed. Secondary Dressing: Zetuvit Plus 4x8 in (Home Health) 3 x Per Week/30 Days Discharge Instructions: Apply over primary dressing as directed. Compression Wrap: ThreePress (3 layer compression wrap) 3 x Per Week/30 Days Discharge Instructions: Apply three layer compression as directed. Electronic Signature(s) Signed: 01/02/2022 2:26:36 PM By: Corey Shan DO Entered By: Corey [name] on 01/02/2022 13:20:11 -------------------------------------------------------------------------------- Problem List Details Patient Name: Date of Service: Corey [name], Corey [name]. 01/02/2022 11:15 A [name] Medical Record Number: [id number] Patient Account Number: [id number] Date of Birth/Sex: Treating RN: [birth year] (76 y.o. Corey [name] Primary Care Provider: Billey [name] Other Clinician: Referring Provider: Treating Provider/Extender: Corey [name] in Treatment: 8 Active Problems ICD-10 Encounter Code Description Active Date MDM Diagnosis L97.918 Non-pressure chronic ulcer  of unspecified part of right lower leg with other 11/01/2021 No Yes specified severity E11.622 Type 2 diabetes mellitus with other skin ulcer 11/01/2021 No Yes I50.42 Chronic combined systolic (congestive) and diastolic (congestive) heart failure 11/01/2021 No Yes I25.119 Atherosclerotic heart disease of native coronary artery with unspecified angina 11/01/2021 No Yes pectoris I87.311 Chronic venous hypertension (idiopathic) with ulcer of right lower extremity 12/26/2021 No Yes Inactive Problems Resolved Problems Electronic Signature(s) Signed: 01/02/2022 2:26:36 PM By: Corey Shan DO Entered By: Corey [name] on 01/02/2022 13:18:23 -------------------------------------------------------------------------------- Progress Note Details Patient Name: Date of Service: Corey [name], Corey [name]. 01/02/2022 11:15 A [name] Medical Record Number: [id number] Patient Account Number: [id number] Date of Birth/Sex: Treating RN:  [birth year] (76 y.o. [name]) Primary Care Provider: Billey [name] Other Clinician: Referring Provider: Treating Provider/Extender: Corey [name] in Treatment: 8 Subjective Chief Complaint Information obtained from Patient 11/01/2021; right lower extremity wound History of Present Illness (HPI) Admission 11/01/2021 Mr. Ciaran Begay Is a 76 year old male with a past medical history of of type 2 diabetes, coronary artery disease, stage IV kidney disease acute on chronic combined systolic and diastolic heart failure that presents to the clinic for a 16-monthhistory of right lower extremity wound. He states he is not sure how this started. It is completely eschared. He reports his wound has been stable for the past month. He has been keeping the area covered. He has mild chronic pain. He denies signs of infection. He states he has a history of lower extremity ulcers last being on the left leg. He could not tell me how this was treated in the past. 6/27; patient presents for  follow-up. He is scheduled for his lower extremity arterial studies on July 6th. He has been using Medihoney to the wound bed. He has no issues or complaints today. He denies signs of infection. 7/6; patient presents for follow-up. He has home health that reported the wound having odor and increased pain last week. He was sent to urgent care. He was started on doxycycline. He is continuing Medihoney. He has his scheduled ABIs w/TBIs today. 7/10; patient presents for follow-up. He had his arterial studies done last week. He had triphasic waveforms throughout the right lower extremity. His TBI was 0.64. He has good pedal pulses. He is almost done with his course of antibiotics. He has been using mupirocin ointment to the wound bed. 7/17; patient presents for follow-up. He has been using Iodosorb under compression therapy. He has no issues or complaints today. He states he is receiving [facility] antibiotic tomorrow In the mail. 7/24; patient presents for follow-up. He has been doing Dakin's wet-to-dry dressings to the wound bed daily without issues. He currently denies signs of infection. He reports completing his course of doxycycline. He has his [facility] antibiotic with him today. 7/31; patient presents for follow-up. [facility] He has no issues or complaints today. He denies signs of infection. 8/7; patient presents for follow-up. [facility] Home health came out once last week to change the wrap. He has no issues or complaints today. He denies signs of infection. 8/14; patient presents for follow-up. We have been using [facility] with Hydrofera Blue under compression therapy. He has tolerated this well. He has no issues or complaints today. 8/21; patient presents for follow-up. We have been using [facility] with Hydrofera Blue under compression therapy. The Hydrofera Blue was stuck on  tightly to the wound bed today. Other than that he has no issues or complaints. We have not heard back for insurance approval for skin sub. Patient History Family History Cancer - Father, Diabetes - Mother,Father, Heart Disease - Mother,Siblings, Hypertension - Mother,Father. Social History Former smoker - quit 1970, Marital Status - Married, Drug Use - No History, Caffeine Use - Moderate - coffee. Medical History Eyes Patient has history of Cataracts Respiratory Patient has history of Sleep Apnea - has CPAP does not wear Cardiovascular Patient has history of Congestive Heart Failure, Hypertension, Myocardial Infarction - 2014 Endocrine Patient has history of Type II Diabetes Musculoskeletal Patient has history of Gout Neurologic Denies history of Seizure Disorder Hospitalization/Surgery History - 06/2021 inguinal  hernia repair. - cardioversion 09/2020. - ventral hernia repair 3/19. 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 Vitals Time Taken: 11:12 AM, Height: 66 in, Weight: 145 lbs, BMI: 23.4, Temperature: 97.8 F, Pulse: 55 bpm, Respiratory Rate: 18 breaths/min, Blood Pressure: 154/82 mmHg. Cardiovascular 2+ dorsalis pedis/posterior tibialis pulses. Psychiatric pleasant and cooperative. General Notes: ght lower extremity: T the anterior aspect there is granulation tissue, nonviable tissue and tightly adhered necrotic tissue. No signs of o surrounding infection. Integumentary (Hair, Skin) Wound #1 status is Open. Original cause of wound was Gradually Appeared. The date acquired was: 08/15/2021. The wound has been in treatment 8 weeks. The wound is located on the Right,Lateral Lower Leg. The wound measures 14cm length x  8.7cm width x 0.2cm depth; 95.661cm^2 area and 19.132cm^3 volume. There is Fat Layer (Subcutaneous Tissue) exposed. There is no tunneling or undermining noted. There is a none present amount of drainage noted. The wound margin is distinct with the outline attached to the wound base. There is no granulation within the wound bed. There is a large (67-100%) amount of necrotic tissue within the wound bed including Eschar and Adherent Slough. 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 Type 2 diabetes mellitus with other skin ulcer Chronic combined systolic (congestive) and diastolic (congestive) heart failure Atherosclerotic heart disease of native coronary artery with unspecified angina pectoris Chronic venous hypertension (idiopathic) with ulcer of right lower extremity Patient's wound has shown improvement in appearance since last clinic visit. There is significantly more granulation tissue present. I debrided nonviable tissue. Since the [facility] was stuck on tightly I recommended changing the dressing to collagen. We will continue with [facility] antibiotics under compression therapy. He may also benefit from blast X and collagen powder. We will go ahead and order this. I asked him to bring this to next clinic visit. Procedures Wound #1 Pre-procedure diagnosis of Wound #1 is a Diabetic Wound/Ulcer of the Lower Extremity located on the Right,Lateral Lower Leg .Severity of Tissue Pre Debridement is: Fat layer exposed. There was a Excisional Skin/Subcutaneous Tissue Debridement with a total area of 121.8 sq cm performed by Corey Shan, DO. With the following instrument(s): Curette to remove Viable and Non-Viable tissue/material. Material removed includes Eschar, Subcutaneous Tissue, Slough, Skin: Dermis, and Skin: Epidermis after achieving pain control using Lidocaine 5% topical ointment. A time out was  conducted at 12:10, prior to the start of the procedure. A Minimum amount of bleeding was controlled with Pressure. The procedure was tolerated well with a pain level of 0 throughout and a pain level of 0 following the procedure. Post Debridement Measurements: 14cm length x 8.7cm width x 0.2cm depth; 19.132cm^3 volume. Character of Wound/Ulcer Post Debridement is improved. Severity of Tissue Post Debridement is: Limited to breakdown of skin. Post procedure Diagnosis Wound #1: Same as Pre-Procedure Pre-procedure diagnosis of Wound #1 is a Diabetic Wound/Ulcer of the Lower Extremity located on the Right,Lateral Lower Leg . There was a Three Layer Compression Therapy Procedure by Corey Pilling, RN. Post procedure Diagnosis Wound #1: Same as Pre-Procedure Plan Follow-up Appointments: Return Appointment in 1 week. - Dr. Heber [place] and [place], Room 8 Monday 1115 01/09/2022 Return Appointment in 2 weeks. - Dr. Heber [place] and [place], Room 8 Monday 1045 01/17/2022 Other: - ****[facility] topical compounding antibiotics  bring to appt each time.**** Will place an order through [employer] for collagen powder and blastx. Anesthetic: (In clinic) Topical Lidocaine 5% applied to wound bed Cellular or Tissue Based Products: Cellular or Tissue Based Product Type: - run insurance for [employer], epicord, and organ[employer] advance tissue product- pending. Bathing/ Shower/ Hygiene: May shower with protection but do not get wound dressing(s) wet. Edema Control - Lymphedema / SCD / Other: Elevate legs to the level of the heart or above for 30 minutes daily and/or when sitting, a frequency of: - 3-4 times a day throughout the day. Avoid standing for long periods of time. Home Health: New wound care orders this week; continue Home Health for wound care. May utilize formulary equivalent dressing for wound treatment orders unless otherwise specified. - home health to twice a week. Compounding topical antibiotics directly  to wound bed. Placing an through [employer] for collagen powder and blastx. You would apply the topical antibiotics first then mix the collagen powder and blastx together and apply over top. Use plain collagen over the topical antibiotics until the powder collagen and blastx arrive. Other Home Health Orders/Instructions: - [facility] WOUND #1: - Lower Leg Wound Laterality: Right, Lateral Cleanser: Soap and Water (Home Health) 3 x Per Week/30 Days Discharge Instructions: May shower and wash wound with dial antibacterial soap and water prior to dressing change. Cleanser: Wound Cleanser (Home Health) 3 x Per Week/30 Days Discharge Instructions: Cleanse the wound with wound cleanser prior to applying a clean dressing using gauze sponges, not tissue or cotton balls. Topical: powder collagen and blastx (Home Health) 3 x Per Week/30 Days Discharge Instructions: mix and apply over the topical antibiotics to wound. Prim Dressing: Fibracol Plus Dressing, 4x4.38 in (collagen) (Home Health) 3 x Per Week/30 Days ary Discharge Instructions: Moisten collagen with saline or hydrogel until the blastx and powder collagen arrive. Prim Dressing: Compounding T ary opical antibiotics (Home Health) 3 x Per Week/30 Days Discharge Instructions: apply the [facility] compounding topical antibiotics directly to wound bed. Secondary Dressing: ABD Pad, 8x10 (Home Health) 3 x Per Week/30 Days Discharge Instructions: Apply over primary dressing as directed. Secondary Dressing: Woven Gauze Sponge, Non-Sterile 4x4 in (Home Health) 3 x Per Week/30 Days Discharge Instructions: Apply over primary dressing as directed. Secondary Dressing: Zetuvit Plus 4x8 in (Home Health) 3 x Per Week/30 Days Discharge Instructions: Apply over primary dressing as directed. Com pression Wrap: ThreePress (3 layer compression wrap) 3 x Per Week/30 Days Discharge Instructions: Apply three layer compression as directed. 1. In  office sharp debridement 2. Collagen with [facility] antibiotic under 3 layer compression 3. Order blast X and collagen powder Electronic Signature(s) Signed: 01/02/2022 2:26:36 PM By: Corey Shan DO Entered By: Corey [name] on 01/02/2022 13:23:17 -------------------------------------------------------------------------------- HxROS Details Patient Name: Date of Service: Corey [name], Corey [name]. 01/02/2022 11:15 A [name] Medical Record Number: [id number] Patient Account Number: [id number] Date of Birth/Sex: Treating RN: February 11, 1946 (76 y.o. [name]) Primary Care Provider: Billey [name] Other Clinician: Referring Provider: Treating Provider/Extender: [name] in Treatment: 8 Constitutional Symptoms (General Health) Medical History: Past Medical History Notes: CVA 2014 bilateral hearing loss Eyes Medical History: Positive for: Cataracts Respiratory Medical History: Positive for: Sleep Apnea - has CPAP does not wear Cardiovascular Medical History: Positive for: Congestive Heart Failure; Hypertension; Myocardial Infarction - 2014 Gastrointestinal Medical History: Past Medical History Notes: Diverticulosis Endocrine Medical History: Positive for: Type II Diabetes Time with diabetes: >20 years Treated with: Oral agents, Diet  Blood sugar tested every day: Yes Tested : BID Genitourinary Medical History: Past Medical History Notes: Renal insufficiency 20/ Musculoskeletal Medical History: Positive for: Gout Neurologic Medical History: Negative for: Seizure Disorder Oncologic Medical History: Past Medical History Notes: Prostate Ca HBO Extended History Items Eyes: Cataracts Immunizations Pneumococcal Vaccine: Received Pneumococcal Vaccination: No Implantable Devices No devices added Hospitalization / Surgery History Type of Hospitalization/Surgery 06/2021 inguinal hernia repair cardioversion 09/2020 ventral hernia repair 3/19 Family and Social  History Cancer: Yes - Father; Diabetes: Yes - Mother,Father; Heart Disease: Yes - Mother,Siblings; Hypertension: Yes - Mother,Father; Former smoker - quit 1970; Marital Status - Married; Drug Use: No History; Caffeine Use: Moderate - coffee; Financial Concerns: No; Food, Clothing or Shelter Needs: No; Support System Lacking: No; Transportation Concerns: No Electronic Signature(s) Signed: 01/02/2022 2:26:36 PM By: Corey Shan DO Entered By: Corey [name] on 01/02/2022 13:19:48 -------------------------------------------------------------------------------- SuperBill Details Patient Name: Date of Service: Corey [name], Corey [name]. 01/02/2022 Medical Record Number: [id number] Patient Account Number: [id number] Date of Birth/Sex: Treating RN: Jul 21, 1945 (76 y.o. 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 with other specified severity E11.622 Type 2 diabetes mellitus with other skin ulcer I50.42 Chronic combined systolic (congestive) and diastolic (congestive) heart failure I25.119 Atherosclerotic heart disease of native coronary artery with unspecified angina pectoris I87.311 Chronic venous hypertension (idiopathic) with ulcer of right lower extremity Facility Procedures CPT4 Code: 99357017 Description: 79390 - DEB SUBQ TISSUE 20 SQ CM/< ICD-10 Diagnosis Description L97.918 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 with other spe Modifier: cified severity Quantity: 1 CPT4 Code: 30092330 Description: 07622 - DEB SUBQ TISS EA ADDL 20CM ICD-10 Diagnosis Description L97.918 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 with other spe Modifier: cified severity Quantity: 6 Physician Procedures : CPT4  Code Description Modifier [id number] 11042 - WC PHYS SUBQ TISS 20 SQ CM ICD-10 Diagnosis Description L97.918 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

## 2022-01-04 ENCOUNTER — Telehealth: Payer: Self-pay

## 2022-01-04 ENCOUNTER — Other Ambulatory Visit: Payer: Self-pay

## 2022-01-04 DIAGNOSIS — I87311 Chronic venous hypertension (idiopathic) with ulcer of right lower extremity: Secondary | ICD-10-CM | POA: Diagnosis not present

## 2022-01-04 DIAGNOSIS — L97918 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 with other specified severity: Secondary | ICD-10-CM | POA: Diagnosis not present

## 2022-01-04 MED ORDER — ROSUVASTATIN CALCIUM 40 MG PO TABS: 40.0000 mg | ORAL_TABLET | Freq: Every day | ORAL | 2 refills | Status: DC

## 2022-01-04 NOTE — Telephone Encounter (Signed)
Appointment made with [facility] for tomorrow.  Patient's wife informed to monitor patient for any change in symptoms and to the ED.

## 2022-01-04 NOTE — Telephone Encounter (Signed)
He needs to be evaluated in person

## 2022-01-05 ENCOUNTER — Ambulatory Visit (INDEPENDENT_AMBULATORY_CARE_PROVIDER_SITE_OTHER): Payer: Medicare HMO | Admitting: Family Medicine

## 2022-01-05 ENCOUNTER — Ambulatory Visit (INDEPENDENT_AMBULATORY_CARE_PROVIDER_SITE_OTHER): Payer: Medicare HMO

## 2022-01-05 ENCOUNTER — Encounter: Payer: Self-pay | Admitting: Family Medicine

## 2022-01-05 VITALS — BP 156/70 | HR 44 | Temp 97.8°F | Ht 66.0 in | Wt 165.0 lb

## 2022-01-05 DIAGNOSIS — R635 Abnormal weight gain: Secondary | ICD-10-CM

## 2022-01-05 DIAGNOSIS — I5020 Unspecified systolic (congestive) heart failure: Secondary | ICD-10-CM

## 2022-01-05 DIAGNOSIS — R001 Bradycardia, unspecified: Secondary | ICD-10-CM | POA: Diagnosis not present

## 2022-01-05 DIAGNOSIS — R051 Acute cough: Secondary | ICD-10-CM

## 2022-01-05 DIAGNOSIS — R0609 Other forms of dyspnea: Secondary | ICD-10-CM

## 2022-01-05 DIAGNOSIS — R0600 Dyspnea, unspecified: Secondary | ICD-10-CM | POA: Diagnosis not present

## 2022-01-05 DIAGNOSIS — R600 Localized edema: Secondary | ICD-10-CM

## 2022-01-05 DIAGNOSIS — N184 Chronic kidney disease, stage 4 (severe): Secondary | ICD-10-CM | POA: Diagnosis not present

## 2022-01-05 LAB — CBC WITH DIFFERENTIAL/PLATELET
Basophils Absolute: 0 10*3/uL (ref 0.0–0.1)
Basophils Relative: 1.1 % (ref 0.0–3.0)
Eosinophils Absolute: 0 10*3/uL (ref 0.0–0.7)
Eosinophils Relative: 0.5 % (ref 0.0–5.0)
HCT: 33.1 % — ABNORMAL LOW (ref 39.0–52.0)
Hemoglobin: 10.6 g/dL — ABNORMAL LOW (ref 13.0–17.0)
Lymphocytes Relative: 17.6 % (ref 12.0–46.0)
Lymphs Abs: 0.7 10*3/uL (ref 0.7–4.0)
MCHC: 32.1 g/dL (ref 30.0–36.0)
MCV: 86.1 fl (ref 78.0–100.0)
Monocytes Absolute: 0.6 10*3/uL (ref 0.1–1.0)
Monocytes Relative: 14 % — ABNORMAL HIGH (ref 3.0–12.0)
Neutro Abs: 2.8 10*3/uL (ref 1.4–7.7)
Neutrophils Relative %: 66.8 % (ref 43.0–77.0)
Platelets: 128 10*3/uL — ABNORMAL LOW (ref 150.0–400.0)
RBC: 3.84 Mil/uL — ABNORMAL LOW (ref 4.22–5.81)
RDW: 17.5 % — ABNORMAL HIGH (ref 11.5–15.5)
WBC: 4.2 10*3/uL (ref 4.0–10.5)

## 2022-01-05 LAB — COMPREHENSIVE METABOLIC PANEL
ALT: 31 U/L (ref 0–53)
AST: 61 U/L — ABNORMAL HIGH (ref 0–37)
Albumin: 3.8 g/dL (ref 3.5–5.2)
Alkaline Phosphatase: 75 U/L (ref 39–117)
BUN: 56 mg/dL — ABNORMAL HIGH (ref 6–23)
CO2: 23 mEq/L (ref 19–32)
Calcium: 9.8 mg/dL (ref 8.4–10.5)
Chloride: 107 mEq/L (ref 96–112)
Creatinine, Ser: 3.75 mg/dL — ABNORMAL HIGH (ref 0.40–1.50)
GFR: 15.26 mL/min — ABNORMAL LOW (ref >60.00)
Glucose, Bld: 95 mg/dL (ref 70–99)
Potassium: 4.3 mEq/L (ref 3.5–5.1)
Sodium: 140 mEq/L (ref 135–145)
Total Bilirubin: 1.1 mg/dL (ref 0.2–1.2)
Total Protein: 7.6 g/dL (ref 6.0–8.3)

## 2022-01-05 LAB — BRAIN NATRIURETIC PEPTIDE: Pro B Natriuretic peptide (BNP): 1740 pg/mL — ABNORMAL HIGH (ref 0.0–100.0)

## 2022-01-05 NOTE — Assessment & Plan Note (Signed)
Check labs including BNP and chest X ray.

## 2022-01-05 NOTE — Assessment & Plan Note (Signed)
unchanged

## 2022-01-05 NOTE — Patient Instructions (Addendum)
Please go downstairs for an x-ray and labs.  We will be in touch with your results.  Continue your current medications as prescribed.  If you develop any worsening shortness of breath or if you develop chest pain, palpitations then call 911 or go to the emergency department.  Follow up with Dr. Marlou Porch as scheduled next week.

## 2022-01-05 NOTE — Assessment & Plan Note (Addendum)
Pedal edema present. Nrmal lung sounds and is not in acute distress.  Ambulated patient and he tolerated this well without worsening shortness of breath or chest pain. Oxygen maintained as well Check labs and chest X ray and follow up

## 2022-01-05 NOTE — Assessment & Plan Note (Signed)
Non infectious and does not appear fluid overloaded. Chest X ray and labs ordered

## 2022-01-05 NOTE — Progress Notes (Signed)
Subjective:     Patient ID: Corey [name], male    DOB: 1945-08-05, 76 y.o.   MRN: [id number]  Chief Complaint  Patient presents with   Shortness of Breath    SOB with exertion for months now   Weight Gain    Is concerned with his weight gain within the past month, last time he checked he was 150 and is now in the 160s    HPI Patient is in today for persistent DOE and reports a 10 lb weight gain over the past month or so. Denies any worsening shortness of breath or edema.   Denies fever, chills, dizziness, chest pain, palpitations, abdominal pain, N/V/D, urinary symptoms.  No orthopnea. Sleeping on 1 pillow and that has not changed.  Reports good energy level and appetite.   States he takes torsemide 3 days per week per cardiologist recommendation.   He has not been using his inhaler.      Health Maintenance Due  Topic Date Due   Zoster Vaccines- Shingrix (1 of 2) Never done   OPHTHALMOLOGY EXAM  05/28/2019   COVID-19 Vaccine (4 - Pfizer risk series) 04/03/2020   TETANUS/TDAP  09/20/2021   INFLUENZA VACCINE  12/13/2021    Past Medical History:  Diagnosis Date   Cataract    CHF (congestive heart failure) (HCC)    Chronic heart failure with preserved ejection fraction (HFpEF) ([place])    Colon polyp 2003   Dr [name]; F/U was to be 2008( not completed)   CVA (cerebral infarction) 2014   Diabetes mellitus    Diverticulosis 2003   Dyspnea    with exertion   Gout    Hypertension    Myocardial infarction ([place]) 2014   ?? PEA cardiac arrest   OSA (obstructive sleep apnea) 03/27/2018   Pneumonia 09/29/2011   Avelox X 10 days as OP   Prostate cancer ([place])    Renal insufficiency    Seizures ([place])    not treated for seizure disorder; had a seizure after stroke 2014; no seizure since then    Past Surgical History:  Procedure Laterality Date   COLONOSCOPY W/ POLYPECTOMY  2003   no F/U ([place] discussed 12/03/12)   INGUINAL HERNIA REPAIR Left 06/16/2021   Procedure:  LAPAROSCOPIC, POSSIBLY OPEN LEFT INGUINAL HERNIA REPAIR WITH MESH;  Surgeon: Felicie Morn, MD;  Location: [facility];  Service: General;  Laterality: Left;   INSERTION OF MESH N/A 07/16/2017   Procedure: INSERTION OF MESH;  Surgeon: [name], MD;  Location: [place];  Service: General;  Laterality: N/A;   PEG PLACEMENT  2014   PEG TUBE REMOVAL  2014   PROSTATE BIOPSY     RIGHT/LEFT HEART CATH AND CORONARY ANGIOGRAPHY N/A 09/20/2020   Procedure: RIGHT/LEFT HEART CATH AND CORONARY ANGIOGRAPHY;  Surgeon: Leonie Man, MD;  Location: [place] [facility];  Service: Cardiovascular;  Laterality: N/A;   TEE WITHOUT CARDIOVERSION N/A 09/27/2020   Procedure: TRANSESOPHAGEAL ECHOCARDIOGRAM (TEE);  Surgeon: Wonda Olds, MD;  Location: [facility];  Service: Thoracic;  Laterality: N/A;   TRACHEOSTOMY  2014   was closed after hospital stay   [place] N/A 07/16/2017   Procedure: [place];  Surgeon: [name], MD;  Location: [facility] OR;  Service: General;  Laterality: N/A;   wrist aspiration  02/16/12    monosodium urate crystals; Dr Caralyn Guile    Family History  Problem Relation Age of Onset  Heart failure Mother    Hypertension Mother    Diabetes Mother    Hypertension Father    Prostate cancer Father    Diabetes Father    Cancer Father    Heart failure Brother    Stroke Neg Hx    Heart disease Neg Hx    Colon cancer Neg Hx     Social History   Socioeconomic History   Marital status: Married    Spouse name: [name]   Number of children: 2   Years of education: 12   Highest education level: 12th grade  Occupational History   Not on file  Tobacco Use   Smoking status: Former    Packs/day: 0.25    Years: 1.00    Total pack years: 0.25    Types: Cigarettes    Quit date: 05/15/1968    Years since quitting: 53.6   Smokeless tobacco: Never   Tobacco comments:    smoked [place], up to 1-2 cigarettes/ day  Vaping Use   Vaping  Use: Never used  Substance and Sexual Activity   Alcohol use: No    Alcohol/week: 0.0 standard drinks of alcohol   Drug use: No   Sexual activity: Yes  Other Topics Concern   Not on file  Social History Narrative   Walks three times a week, 2-3 miles, occasional walks on treadmill       Patient is right-handed. He lives with his wife in a one level home. He drinks 1 diet Mt. Dew a day and rarely has coffee. He walks 3 x a week for exercise.   Social Determinants of Health   Financial Resource Strain: Medium Risk (10/18/2020)   Overall Financial Resource Strain (CARDIA)    Difficulty of Paying Living Expenses: Somewhat hard  Food Insecurity: No Food Insecurity (11/18/2021)   Hunger Vital Sign    Worried About Running Out of Food in the Last Year: Never true    Ran Out of Food in the Last Year: Never true  Transportation Needs: No Transportation Needs (12/22/2021)   PRAPARE - Hydrologist (Medical): No    Lack of Transportation (Non-Medical): No  Physical Activity: Not on file  Stress: Not on file  Social Connections: Not on file  Intimate Partner Violence: Not on file    Outpatient Medications Prior to Visit  Medication Sig Dispense Refill   ACCU-CHEK AVIVA PLUS test strip TEST BLOOD SUGAR TWICE DAILY AS DIRECTED 200 strip 1   ACCU-CHEK AVIVA PLUS test strip Use as instructed 50 each 12   albuterol (VENTOLIN HFA) 108 (90 Base) MCG/ACT inhaler Inhale 2 puffs into the lungs every 4 (four) hours as needed for wheezing or shortness of breath. 18 g 1   allopurinol (ZYLOPRIM) 100 MG tablet TAKE 1/2 TABLET ONE TIME DAILY 45 tablet 3   aspirin EC 81 MG tablet Take 1 tablet (81 mg total) by mouth daily.     bismuth subsalicylate (PEPTO BISMOL) 262 MG/15ML suspension Take 30 mLs by mouth every 6 (six) hours as needed for indigestion.     colchicine (COLCRYS) 0.6 MG tablet Take 2 tabs once and then one tab one hour later as needed for gout 15 tablet 2   doxycycline  (VIBRAMYCIN) 100 MG capsule Take 1 capsule (100 mg total) by mouth 2 (two) times daily. 14 capsule 0   glipiZIDE (GLUCOTROL XL) 2.5 MG 24 hr tablet Take 1 tablet (2.5 mg total) by mouth daily with breakfast. 90  tablet 1   hydrALAZINE (APRESOLINE) 50 MG tablet Take 2 tablets (100 mg total) by mouth 3 (three) times daily. 180 tablet 11   JANUVIA 25 MG tablet Take 1 tablet by mouth once daily 30 tablet 0   rosuvastatin (CRESTOR) 40 MG tablet Take 1 tablet (40 mg total) by mouth daily. 90 tablet 2   Tafamidis (VYNDAMAX) 61 MG CAPS Take 61 mg by mouth daily. 30 capsule 12   torsemide (DEMADEX) 20 MG tablet Take 1 tablet (20 mg total) by mouth daily. 90 tablet 1   TRUEplus Lancets 33G MISC USE 4 (FOUR) TIMES DAILY 400 each 2   nitroGLYCERIN (NITROSTAT) 0.4 MG SL tablet Place 0.4 mg under the tongue every 5 (five) minutes as needed for chest pain.     No facility-administered medications prior to visit.    Allergies  Allergen Reactions   Hydrochlorothiazide Other (See Comments)    Gout , uncontrolled diabetes and renal insufficiency    ROS     Objective:    Physical Exam Constitutional:      General: He is not in acute distress.    Appearance: He is not ill-appearing.  Neck:     Vascular: No JVD.  Cardiovascular:     Rate and Rhythm: Regular rhythm. Bradycardia present.  Pulmonary:     Effort: Pulmonary effort is normal. No tachypnea, accessory muscle usage or respiratory distress.     Breath sounds: Normal breath sounds.  Musculoskeletal:     Cervical back: Normal range of motion and neck supple.     Right lower leg: Edema present.     Left lower leg: Edema present.  Skin:    General: Skin is warm and dry.  Neurological:     General: No focal deficit present.     Mental Status: He is alert and oriented to person, place, and time.     Motor: No weakness.  Psychiatric:        Mood and Affect: Mood normal.        Behavior: Behavior normal.     BP (!) 156/70 (BP Location:  Left Arm, Patient Position: Sitting, Cuff Size: Large)   Pulse (!) 44   Temp 97.8 F (36.6 C) (Temporal)   Ht '5\' 6"'$  (1.676 m)   Wt 165 lb (74.8 kg)   SpO2 96%   BMI 26.63 kg/m  Wt Readings from Last 3 Encounters:  01/05/22 165 lb (74.8 kg)  10/24/21 157 lb (71.2 kg)  10/14/21 168 lb 3.2 oz (76.3 kg)        Assessment & Plan:   Problem List Items Addressed This Visit       Cardiovascular and Mediastinum   HFrEF (heart failure with reduced ejection fraction) (HCC)    Pedal edema present. Nrmal lung sounds and is not in acute distress.  Ambulated patient and he tolerated this well without worsening shortness of breath or chest pain. Oxygen maintained as well Check labs and chest X ray and follow up      Relevant Orders   CBC with Differential/Platelet (Completed)   Comprehensive metabolic panel (Completed)   Brain natriuretic peptide (Completed)   DG Chest 2 View (Completed)     Genitourinary   Chronic kidney disease (CKD), stage IV (severe) (HCC)   Relevant Orders   Comprehensive metabolic panel (Completed)     Other   Acute cough    Non infectious and does not appear fluid overloaded. Chest X ray and labs ordered  Relevant Orders   CBC with Differential/Platelet (Completed)   DG Chest 2 View (Completed)   Bilateral edema of lower extremity    Check labs including BNP and chest X ray.       Relevant Orders   CBC with Differential/Platelet (Completed)   Brain natriuretic peptide (Completed)   DG Chest 2 View (Completed)   Bradycardia    unchanged      DOE (dyspnea on exertion) - Primary   Relevant Orders   CBC with Differential/Platelet (Completed)   Comprehensive metabolic panel (Completed)   Brain natriuretic peptide (Completed)   DG Chest 2 View (Completed)   Weight gain    Weight gain over the past month and not acute      Relevant Orders   Brain natriuretic peptide (Completed)   DG Chest 2 View (Completed)    I am having [name]  maintain his aspirin EC, colchicine, bismuth subsalicylate, Accu-Chek Aviva Plus, Accu-Chek Aviva Plus, nitroGLYCERIN, TRUEplus Lancets 33G, Vyndamax, glipiZIDE, torsemide, allopurinol, doxycycline, albuterol, hydrALAZINE, Januvia, and rosuvastatin.  No orders of the defined types were placed in this encounter.

## 2022-01-05 NOTE — Progress Notes (Signed)
His labs show that he is fluid overloaded and I recommend he take his torsemide dose today, tomorrow and next Monday, and Wednesday until he sees his cardiologist. He is anemic which is new since his previous labs 6 months ago. This will need to be worked up and may contribute to his shortness of breath. Please have him schedule with Dr. Quay Burow for her next available to address. Thanks.

## 2022-01-05 NOTE — Assessment & Plan Note (Signed)
Weight gain over the past month and not acute

## 2022-01-06 DIAGNOSIS — E1122 Type 2 diabetes mellitus with diabetic chronic kidney disease: Secondary | ICD-10-CM | POA: Diagnosis not present

## 2022-01-06 DIAGNOSIS — R809 Proteinuria, unspecified: Secondary | ICD-10-CM | POA: Diagnosis not present

## 2022-01-06 DIAGNOSIS — I120 Hypertensive chronic kidney disease with stage 5 chronic kidney disease or end stage renal disease: Secondary | ICD-10-CM | POA: Diagnosis not present

## 2022-01-06 DIAGNOSIS — N185 Chronic kidney disease, stage 5: Secondary | ICD-10-CM | POA: Diagnosis not present

## 2022-01-06 DIAGNOSIS — I5042 Chronic combined systolic (congestive) and diastolic (congestive) heart failure: Secondary | ICD-10-CM | POA: Diagnosis not present

## 2022-01-06 DIAGNOSIS — R6889 Other general symptoms and signs: Secondary | ICD-10-CM | POA: Diagnosis not present

## 2022-01-06 DIAGNOSIS — E8581 Light chain (AL) amyloidosis: Secondary | ICD-10-CM | POA: Diagnosis not present

## 2022-01-06 DIAGNOSIS — N2581 Secondary hyperparathyroidism of renal origin: Secondary | ICD-10-CM | POA: Diagnosis not present

## 2022-01-06 DIAGNOSIS — I3139 Other pericardial effusion (noninflammatory): Secondary | ICD-10-CM | POA: Diagnosis not present

## 2022-01-06 DIAGNOSIS — N281 Cyst of kidney, acquired: Secondary | ICD-10-CM | POA: Diagnosis not present

## 2022-01-09 ENCOUNTER — Encounter (HOSPITAL_BASED_OUTPATIENT_CLINIC_OR_DEPARTMENT_OTHER): Payer: Medicare HMO | Admitting: Internal Medicine

## 2022-01-09 DIAGNOSIS — I130 Hypertensive heart and chronic kidney disease with heart failure and stage 1 through stage 4 chronic kidney disease, or unspecified chronic kidney disease: Secondary | ICD-10-CM | POA: Diagnosis not present

## 2022-01-09 DIAGNOSIS — E11622 Type 2 diabetes mellitus with other skin ulcer: Secondary | ICD-10-CM | POA: Diagnosis not present

## 2022-01-09 DIAGNOSIS — R6889 Other general symptoms and signs: Secondary | ICD-10-CM | POA: Diagnosis not present

## 2022-01-09 DIAGNOSIS — I5042 Chronic combined systolic (congestive) and diastolic (congestive) heart failure: Secondary | ICD-10-CM | POA: Diagnosis not present

## 2022-01-09 DIAGNOSIS — L97918 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 with other specified severity: Secondary | ICD-10-CM | POA: Diagnosis not present

## 2022-01-09 DIAGNOSIS — N184 Chronic kidney disease, stage 4 (severe): Secondary | ICD-10-CM | POA: Diagnosis not present

## 2022-01-09 DIAGNOSIS — I25119 Atherosclerotic heart disease of native coronary artery with unspecified angina pectoris: Secondary | ICD-10-CM | POA: Diagnosis not present

## 2022-01-09 DIAGNOSIS — I87311 Chronic venous hypertension (idiopathic) with ulcer of right lower extremity: Secondary | ICD-10-CM | POA: Diagnosis not present

## 2022-01-09 NOTE — Progress Notes (Signed)
Corey [name] ([id number]) Visit Report for 01/09/2022 Chief Complaint Document Details Patient Name: Date of Service: Corey [name] 01/09/2022 11:15 A M Medical Record Number: [id number] Patient Account Number: [id number] Date of Birth/Sex: Treating RN: [birth year] (76 y.o. [name] Primary Care Provider: Billey Gosling Other Clinician: Referring Provider: Treating Provider/Extender: [name] in Treatment: 9 Information Obtained from: Patient Chief Complaint 11/01/2021; right lower extremity wound Electronic Signature(s) Signed: 01/09/2022 4:36:36 PM By: Kalman Shan DO Entered By: Kalman Shan on 01/09/2022 12:18:10 -------------------------------------------------------------------------------- Debridement Details Patient Name: Date of Service: Corey [name], [name] 01/09/2022 11:15 A M Medical Record Number: [id number] Patient Account Number: [id number] Date of Birth/Sex: Treating RN: 1946/02/28 (76 y.o. [name] Primary Care Provider: Billey Gosling Other Clinician: Referring Provider: Treating Provider/Extender: Mickle Asper in Treatment: 9 Debridement Performed for Assessment: Wound #1 Right,Lateral Lower Leg Performed By: Physician Kalman Shan, DO Debridement Type: Debridement Severity of Tissue Pre Debridement: Fat layer exposed Level of Consciousness (Pre-procedure): Awake and Alert Pre-procedure Verification/Time Out Yes - 11:35 Taken: Start Time: 11:36 Pain Control: Lidocaine 5% topical ointment T Area Debrided (L x W): otal 12 (cm) x 7 (cm) = 84 (cm) Tissue and other material debrided: Viable, Non-Viable, Slough, Subcutaneous, Slough Level: Skin/Subcutaneous Tissue Debridement Description: Excisional Instrument: Curette Bleeding: Minimum End Time: 11:42 Procedural Pain: 0 Post Procedural Pain: 0 Response to Treatment: Procedure was tolerated well Level of Consciousness (Post- Awake and  Alert procedure): Post Debridement Measurements of Total Wound Length: (cm) 13.8 Width: (cm) 8 Depth: (cm) 0.2 Volume: (cm) 17.342 Character of Wound/Ulcer Post Debridement: Improved Severity of Tissue Post Debridement: Fat layer exposed Post Procedure Diagnosis Same as Pre-procedure Electronic Signature(s) Signed: 01/09/2022 4:36:36 PM By: Kalman Shan DO Signed: 01/09/2022 5:13:32 PM By: Deon Pilling RN, BSN Entered By: Deon Pilling on 01/09/2022 11:43:04 -------------------------------------------------------------------------------- HPI Details Patient Name: Date of Service: Corey [name], [name] 01/09/2022 11:15 A M Medical Record Number: [id number] Patient Account Number: [id number] Date of Birth/Sex: Treating RN: [birth year] (76 y.o. [name] Primary Care Provider: Billey Gosling Other Clinician: Referring Provider: Treating Provider/Extender: Mickle Asper in Treatment: 9 History of Present Illness HPI Description: Admission 11/01/2021 [name] Is a 76 year old male with a past medical history of of type 2 diabetes, coronary artery disease, stage IV kidney disease acute on chronic combined systolic and diastolic heart failure that presents to the clinic for a 49-monthhistory of right lower extremity wound. He states he is not sure how this started. It is completely eschared. He reports his wound has been stable for the past month. He has been keeping the area covered. He has mild chronic pain. He denies signs of infection. He states he has a history of lower extremity ulcers last being on the left leg. He could not tell me how this was treated in the past. 6/27; patient presents for follow-up. He is scheduled for his lower extremity arterial studies on July 6th. He has been using Medihoney to the wound bed. He has no issues or complaints today. He denies signs of infection. 7/6; patient presents for follow-up. He has home health that reported  the wound having odor and increased pain last week. He was sent to urgent care. He was started on doxycycline. He is continuing Medihoney. He has his scheduled ABIs w/TBIs today. 7/10; patient presents for follow-up. He had his arterial studies done last week. He had triphasic  waveforms throughout the right lower extremity. His TBI was 0.64. He has good pedal pulses. He is almost done with his course of antibiotics. He has been using mupirocin ointment to the wound bed. 7/17; patient presents for follow-up. He has been using Iodosorb under compression therapy. He has no issues or complaints today. He states he is receiving [facility] antibiotic tomorrow In the mail. 7/24; patient presents for follow-up. He has been doing Dakin's wet-to-dry dressings to the wound bed daily without issues. He currently denies signs of infection. He reports completing his course of doxycycline. He has his [facility] antibiotic with him today. 7/31; patient presents for follow-up. We have been using Iodoflex with [facility] under compression therapy. He has no issues or complaints today. He denies signs of infection. 8/7; patient presents for follow-up. We have been using Iodoflex with [facility] under compression therapy. Home health came out once last week to change the wrap. He has no issues or complaints today. He denies signs of infection. 8/14; patient presents for follow-up. We have been using [facility] with Hydrofera Blue under compression therapy. He has tolerated this well. He has no issues or complaints today. 8/21; patient presents for follow-up. We have been using [facility] with Hydrofera Blue under compression therapy. The Hydrofera Blue was stuck on tightly to the wound bed today. Other than that he has no issues or complaints. We have not heard back for insurance approval for skin sub. 8/28; patient presents for follow-up. We ordered blast X and collagen powder at last clinic visit. It is unclear if he has received  this. We placed Key[facility] with Fibracol under 3 layer compression last week. Patient has home health. Patient has no issues or complaints today. He was not approved for TheraSkin. Electronic Signature(s) Signed: 01/09/2022 4:36:36 PM By: Kalman Shan DO Entered By: Kalman Shan on 01/09/2022 12:19:26 -------------------------------------------------------------------------------- Physical Exam Details Patient Name: Date of Service: Corey [name], [name] 01/09/2022 11:15 A M Medical Record Number: [id number] Patient Account Number: [id number] Date of Birth/Sex: Treating RN: 1946/04/16 (76 y.o. [name] Primary Care Provider: Billey Gosling Other Clinician: Referring Provider: Treating Provider/Extender: [name] in Treatment: 9 Constitutional respirations regular, non-labored and within target range for patient.. Cardiovascular 2+ dorsalis pedis/posterior tibialis pulses. Psychiatric pleasant and cooperative. Notes Right lower extremity: T the anterior aspect there is granulation tissue and tightly adhered nonviable tissue. No signs of surrounding infection. o Electronic Signature(s) Signed: 01/09/2022 4:36:36 PM By: Kalman Shan DO Entered By: Kalman Shan on 01/09/2022 12:20:09 -------------------------------------------------------------------------------- Physician Orders Details Patient Name: Date of Service: Corey [name], [name] 01/09/2022 11:15 A M Medical Record Number: [id number] Patient Account Number: [id number] Date of Birth/Sex: Treating RN: 07-22-1945 (76 y.o. [name] Primary Care Provider: Billey Gosling Other Clinician: Referring Provider: Treating Provider/Extender: Mickle Asper in Treatment: 9 Verbal / Phone Orders: No Diagnosis Coding Follow-up Appointments ppointment in 1 week. - Dr. Heber [place] and [place], Room 8 Monday 1045 01/17/2022 Return A ppointment in 2 weeks. - Dr. Heber  and [place], Room  8 Monday 1115 01/23/2022 Return A Other: - ****[facility] topical compounding antibiotics bring to appt each time.**** [employer] for collagen powder and blastx. Anesthetic (In clinic) Topical Lidocaine 5% applied to wound bed Cellular or Tissue Based Products Cellular or Tissue Based Product Type: - run insurance for [employer], epicord, and organogenesis advance tissue product- pending. Theraskin not approved. Bathing/ Shower/ Hygiene May shower with protection but do not get wound dressing(s)  wet. Edema Control - Lymphedema / SCD / Other Elevate legs to the level of the heart or above for 30 minutes daily and/or when sitting, a frequency of: - 3-4 times a day throughout the day. Avoid standing for long periods of time. Home Health New wound care orders this week; continue Home Health for wound care. May utilize formulary equivalent dressing for wound treatment orders unless otherwise specified. - home health to twice a week. Compounding topical antibiotics directly to wound bed. Placing an through [employer] for collagen powder and blastx. You would apply the topical antibiotics first then mix the collagen powder and blastx together and apply over top. Use plain collagen over the topical antibiotics until the powder collagen and blastx arrive. Other Home Health Orders/Instructions: - [facility] Wound Treatment Wound #1 - Lower Leg Wound Laterality: Right, Lateral Cleanser: Soap and Water (Home Health) 3 x Per Week/30 Days Discharge Instructions: May shower and wash wound with dial antibacterial soap and water prior to dressing change. Cleanser: Wound Cleanser (Home Health) 3 x Per Week/30 Days Discharge Instructions: Cleanse the wound with wound cleanser prior to applying a clean dressing using gauze sponges, not tissue or cotton balls. Topical: powder collagen and blastx (Home Health) 3 x Per Week/30 Days Discharge Instructions: mix and apply over the topical  antibiotics to wound. Prim Dressing: Fibracol Plus Dressing, 4x4.38 in (collagen) (Home Health) 3 x Per Week/30 Days ary Discharge Instructions: Moisten collagen with saline or hydrogel until the blastx and powder collagen arrive. Prim Dressing: Compounding T ary opical antibiotics (Home Health) 3 x Per Week/30 Days Discharge Instructions: apply the [facility] compounding topical antibiotics directly to wound bed. Secondary Dressing: ABD Pad, 8x10 (Home Health) 3 x Per Week/30 Days Discharge Instructions: Apply over primary dressing as directed. Secondary Dressing: Woven Gauze Sponge, Non-Sterile 4x4 in (Home Health) 3 x Per Week/30 Days Discharge Instructions: Apply over primary dressing as directed. Secondary Dressing: Zetuvit Plus 4x8 in (Home Health) 3 x Per Week/30 Days Discharge Instructions: Apply over primary dressing as directed. Compression Wrap: ThreePress (3 layer compression wrap) 3 x Per Week/30 Days Discharge Instructions: Apply three layer compression as directed. Electronic Signature(s) Signed: 01/09/2022 4:36:36 PM By: Kalman Shan DO Entered By: Kalman Shan on 01/09/2022 12:20:50 -------------------------------------------------------------------------------- Problem List Details Patient Name: Date of Service: Corey [name], [name] 01/09/2022 11:15 A M Medical Record Number: [id number] Patient Account Number: [id number] Date of Birth/Sex: Treating RN: 02-28-1946 (76 y.o. [name] Primary Care Provider: Billey Gosling Other Clinician: Referring Provider: Treating Provider/Extender: Mickle Asper in Treatment: 9 Active Problems ICD-10 Encounter Code Description Active Date MDM Diagnosis L97.918 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 with other 11/01/2021 No Yes specified severity E11.622 Type 2 diabetes mellitus with other skin ulcer 11/01/2021 No Yes I50.42 Chronic combined systolic (congestive)  and diastolic (congestive) heart failure 11/01/2021 No Yes I25.119 Atherosclerotic heart disease of native coronary artery with unspecified angina 11/01/2021 No Yes pectoris I87.311 Chronic venous hypertension (idiopathic) with ulcer of right lower extremity 12/26/2021 No Yes Inactive Problems Resolved Problems Electronic Signature(s) Signed: 01/09/2022 4:36:36 PM By: Kalman Shan DO Entered By: Kalman Shan on 01/09/2022 12:17:56 -------------------------------------------------------------------------------- Progress Note Details Patient Name: Date of Service: Corey [name], [name] 01/09/2022 11:15 A M Medical Record Number: [id number] Patient Account Number: [id number] Date of Birth/Sex: Treating RN: [birth year] (76 y.o. [name] Primary Care Provider: Billey Gosling Other Clinician: Referring Provider: Treating Provider/Extender: Heber [facility]  Nelwyn [place], Marzetta Board Weeks in Treatment: 9 Subjective Chief Complaint Information obtained from Patient 11/01/2021; right lower extremity wound History of Present Illness (HPI) Admission 11/01/2021 Mr. Jared Whorley Is a 76 year old male with a past medical history of of type 2 diabetes, coronary artery disease, stage IV kidney disease acute on chronic combined systolic and diastolic heart failure that presents to the clinic for a 41-monthhistory of right lower extremity wound. He states he is not sure how this started. It is completely eschared. He reports his wound has been stable for the past month. He has been keeping the area covered. He has mild chronic pain. He denies signs of infection. He states he has a history of lower extremity ulcers last being on the left leg. He could not tell me how this was treated in the past. 6/27; patient presents for follow-up. He is scheduled for his lower extremity arterial studies on July 6th. He has been using Medihoney to the wound bed. He has no issues or complaints today. He denies signs of  infection. 7/6; patient presents for follow-up. He has home health that reported the wound having odor and increased pain last week. He was sent to urgent care. He was started on doxycycline. He is continuing Medihoney. He has his scheduled ABIs w/TBIs today. 7/10; patient presents for follow-up. He had his arterial studies done last week. He had triphasic waveforms throughout the right lower extremity. His TBI was 0.64. He has good pedal pulses. He is almost done with his course of antibiotics. He has been using mupirocin ointment to the wound bed. 7/17; patient presents for follow-up. He has been using Iodosorb under compression therapy. He has no issues or complaints today. He states he is receiving [facility] antibiotic tomorrow In the mail. 7/24; patient presents for follow-up. He has been doing Dakin's wet-to-dry dressings to the wound bed daily without issues. He currently denies signs of infection. He reports completing his course of doxycycline. He has his [facility] antibiotic with him today. 7/31; patient presents for follow-up. We have [facility] He has no issues or complaints today. He denies signs of infection. 8/7; patient presents for follow-up. [facility] Home health came out once last week to change the wrap. He has no issues or complaints today. He denies signs of infection. 8/14; patient presents for follow-up. We have been using [facility] with Hydrofera Blue under compression therapy. He has tolerated this well. He has no issues or complaints today. 8/21; patient presents for follow-up. We have been using [facility] with Hydrofera Blue under compression therapy. The Hydrofera Blue was stuck on tightly to the wound bed today. Other than that he has no issues or complaints. We have not heard back for insurance approval for skin sub. 8/28; patient presents for follow-up. We ordered  blast X and collagen powder at last clinic visit. It is unclear if he has received this. We placed [facility] with Fibracol under 3 layer compression last week. Patient has home health. Patient has no issues or complaints today. He was not approved for TheraSkin. Patient History Family History Cancer - Father, Diabetes - Mother,Father, Heart Disease - Mother,Siblings, Hypertension - Mother,Father. Social History Former smoker - quit 1970, Marital Status - Married, Drug Use - No History, Caffeine Use - Moderate - coffee. Medical History Eyes Patient has history of Cataracts Respiratory Patient has history of Sleep Apnea - has CPAP does not wear Cardiovascular Patient  has history of Congestive Heart Failure, Hypertension, Myocardial Infarction - 2014 Endocrine Patient has history of Type II Diabetes Musculoskeletal Patient has history of Gout Neurologic Denies history of Seizure Disorder Hospitalization/Surgery History - 06/2021 inguinal hernia repair. - cardioversion 09/2020. - ventral hernia repair 3/19. 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 Vitals Time Taken: 11:25 AM, Height: 66 in, Weight: 145 lbs, BMI: 23.4, Temperature: 97.9 F, Pulse: 41 bpm, Respiratory Rate: 20 breaths/min, Blood Pressure: 145/78 mmHg. Cardiovascular 2+ dorsalis pedis/posterior tibialis pulses. Psychiatric pleasant and cooperative. General Notes: Right lower extremity: T the anterior aspect there is granulation tissue and tightly adhered nonviable tissue. No signs of surrounding infection. o Integumentary (Hair, Skin) Wound #1 status is Open. Original cause of wound was Gradually Appeared. The date acquired was: 08/15/2021. The wound has been in  treatment 9 weeks. The wound is located on the Right,Lateral Lower Leg. The wound measures 13.8cm length x 8cm width x 0.2cm depth; 86.708cm^2 area and 17.342cm^3 volume. There is Fat Layer (Subcutaneous Tissue) exposed. There is no tunneling or undermining noted. There is a medium amount of serosanguineous drainage noted. The wound margin is distinct with the outline attached to the wound base. There is large (67-100%) red, pink granulation within the wound bed. There is a small (1-33%) amount of necrotic tissue within the wound bed including Adherent Slough. 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 Type 2 diabetes mellitus with other skin ulcer Chronic combined systolic (congestive) and diastolic (congestive) heart failure Atherosclerotic heart disease of native coronary artery with unspecified angina pectoris Chronic venous hypertension (idiopathic) with ulcer of right lower extremity Patient's wound has shown improvement in size appearance since last clinic visit. I debrided nonviable tissue. I recommended continuing with [facility] antibiotic and collagen. If he has obtained blast X I recommended adding this to the regimen. Continue 3 layer compression. Follow-up in 1 week. Procedures Wound #1 Pre-procedure diagnosis of Wound #1 is a Diabetic Wound/Ulcer of the Lower Extremity located on the Right,Lateral Lower Leg .Severity of Tissue Pre Debridement is: Fat layer exposed. There was a Excisional Skin/Subcutaneous Tissue Debridement with a total area of 84 sq cm performed by Kalman Shan, DO. With the following instrument(s): Curette to remove Viable and Non-Viable tissue/material. Material removed includes Subcutaneous Tissue and Slough and after achieving pain control using Lidocaine 5% topical ointment. A time out was conducted at 11:35, prior to the start of the procedure. A Minimum amount of  bleeding was controlled with N/A. The procedure was tolerated well with a pain level of 0 throughout and a pain level of 0 following the procedure. Post Debridement Measurements: 13.8cm length x 8cm width x 0.2cm depth; 17.342cm^3 volume. Character of Wound/Ulcer Post Debridement is improved. Severity of Tissue Post Debridement is: Fat layer exposed. Post procedure Diagnosis Wound #1: Same as Pre-Procedure Pre-procedure diagnosis of Wound #1 is a Diabetic Wound/Ulcer of the Lower Extremity located on the Right,Lateral Lower Leg . There was a Three Layer Compression Therapy Procedure by Deon Pilling, RN. Post procedure Diagnosis Wound #1: Same as Pre-Procedure Plan Follow-up Appointments: Return Appointment in 1 week. - Dr. Heber [place] and [place], Room 8 Monday 1045 01/17/2022 Return Appointment in 2 weeks. - Dr. Heber [place] and [place], Room 8 Monday 1115 01/23/2022 Other: - ****[facility] topical compounding  antibiotics bring to appt each time.**** [facility] for collagen powder and blastx. Anesthetic: (In clinic) Topical Lidocaine 5% applied to wound bed Cellular or Tissue Based Products: Cellular or Tissue Based Product Type: - run insurance for [employer], epicord, and organogenesis advance tissue product- pending. Theraskin not approved. Bathing/ Shower/ Hygiene: May shower with protection but do not get wound dressing(s) wet. Edema Control - Lymphedema / SCD / Other: Elevate legs to the level of the heart or above for 30 minutes daily and/or when sitting, a frequency of: - 3-4 times a day throughout the day. Avoid standing for long periods of time. Home Health: New wound care orders this week; continue Home Health for wound care. May utilize formulary equivalent dressing for wound treatment orders unless otherwise specified. - home health to twice a week. Compounding topical antibiotics directly to wound bed. Placing an through [employer] for collagen powder and blastx. You would apply the  topical antibiotics first then mix the collagen powder and blastx together and apply over top. Use plain collagen over the topical antibiotics until the powder collagen and blastx arrive. Other Home Health Orders/Instructions: - [facility] WOUND #1: - Lower Leg Wound Laterality: Right, Lateral Cleanser: Soap and Water (Home Health) 3 x Per Week/30 Days Discharge Instructions: May shower and wash wound with dial antibacterial soap and water prior to dressing change. Cleanser: Wound Cleanser (Home Health) 3 x Per Week/30 Days Discharge Instructions: Cleanse the wound with wound cleanser prior to applying a clean dressing using gauze sponges, not tissue or cotton balls. Topical: powder collagen and blastx (Home Health) 3 x Per Week/30 Days Discharge Instructions: mix and apply over the topical antibiotics to wound. Prim Dressing: Fibracol Plus Dressing, 4x4.38 in (collagen) (Home Health) 3 x Per Week/30 Days ary Discharge Instructions: Moisten collagen with saline or hydrogel until the blastx and powder collagen arrive. Prim Dressing: Compounding T ary opical antibiotics (Home Health) 3 x Per Week/30 Days Discharge Instructions: apply the [facility] compounding topical antibiotics directly to wound bed. Secondary Dressing: ABD Pad, 8x10 (Home Health) 3 x Per Week/30 Days Discharge Instructions: Apply over primary dressing as directed. Secondary Dressing: Woven Gauze Sponge, Non-Sterile 4x4 in (Home Health) 3 x Per Week/30 Days Discharge Instructions: Apply over primary dressing as directed. Secondary Dressing: Zetuvit Plus 4x8 in (Home Health) 3 x Per Week/30 Days Discharge Instructions: Apply over primary dressing as directed. Com pression Wrap: ThreePress (3 layer compression wrap) 3 x Per Week/30 Days Discharge Instructions: Apply three layer compression as directed. 1. In office sharp debridement 2. Collagen, blast X and [facility] antibiotic under 3 layer compression 3.  Follow-up in 1 week Electronic Signature(s) Signed: 01/09/2022 4:36:36 PM By: Kalman Shan DO Entered By: Kalman Shan on 01/09/2022 12:50:15 -------------------------------------------------------------------------------- HxROS Details Patient Name: Date of Service: Corey [name], [name] 01/09/2022 11:15 A M Medical Record Number: [id number] Patient Account Number: [id number] Date of Birth/Sex: Treating RN: 09-Oct-1945 (76 y.o. [name] Primary Care Provider: Billey Gosling Other Clinician: Referring Provider: Treating Provider/Extender: [name] in Treatment: 9 Constitutional Symptoms (General Health) Medical History: Past Medical History Notes: CVA 2014 bilateral hearing loss Eyes Medical History: Positive for: Cataracts Respiratory Medical History: Positive for: Sleep Apnea - has CPAP does not wear Cardiovascular Medical History: Positive for: Congestive Heart Failure; Hypertension; Myocardial Infarction - 2014 Gastrointestinal Medical History: Past Medical History Notes: Diverticulosis Endocrine Medical History: Positive for: Type II Diabetes Time with diabetes: >20 years Treated with: Oral agents,  Diet Blood sugar tested every day: Yes Tested : BID Genitourinary Medical History: Past Medical History Notes: Renal insufficiency 20/ Musculoskeletal Medical History: Positive for: Gout Neurologic Medical History: Negative for: Seizure Disorder Oncologic Medical History: Past Medical History Notes: Prostate Ca HBO Extended History Items Eyes: Cataracts Immunizations Pneumococcal Vaccine: Received Pneumococcal Vaccination: No Implantable Devices No devices added Hospitalization / Surgery History Type of Hospitalization/Surgery 06/2021 inguinal hernia repair cardioversion 09/2020 ventral hernia repair 3/19 Family and Social History Cancer: Yes - Father; Diabetes: Yes - Mother,Father; Heart Disease: Yes -  Mother,Siblings; Hypertension: Yes - Mother,Father; Former smoker - quit 1970; Marital Status - Married; Drug Use: No History; Caffeine Use: Moderate - coffee; Financial Concerns: No; Food, Clothing or Shelter Needs: No; Support System Lacking: No; Transportation Concerns: No Electronic Signature(s) Signed: 01/09/2022 4:36:36 PM By: Kalman Shan DO Signed: 01/09/2022 5:13:32 PM By: Deon Pilling RN, BSN Entered By: Kalman Shan on 01/09/2022 12:19:32 -------------------------------------------------------------------------------- [place] Details Patient Name: Date of Service: Corey [name]. 01/09/2022 Medical Record Number: [id number] Patient Account Number: [id number] Date of Birth/Sex: Treating RN: 06/23/1945 (76 y.o. 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 E11.622 Type 2 diabetes mellitus with other skin ulcer I50.42 Chronic combined systolic (congestive) and diastolic (congestive) heart failure I25.119 Atherosclerotic heart disease of native coronary artery with unspecified angina pectoris I87.311 Chronic venous hypertension (idiopathic) with ulcer of right lower extremity Facility Procedures CPT4 Code: [id number] Description: 12248 - DEB SUBQ TISSUE 20 SQ CM/< ICD-10 Diagnosis Description L97.918 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 with other spe Modifier: cified severity Quantity: 1 CPT4 Code: 25003704 Description: 88891 - DEB SUBQ TISS EA ADDL 20CM ICD-10 Diagnosis Description L97.918 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 with other spe Modifier: cified severity Quantity: 4 Physician Procedures : CPT4 Code Description Modifier  [id number] 11042 - WC PHYS SUBQ TISS 20 SQ CM ICD-10 Diagnosis Description L97.918 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

## 2022-01-09 NOTE — Progress Notes (Signed)
Corey, [name] ([id number]) Visit Report for 01/09/2022 Arrival Information Details Patient Name: Date of Service: Corey [name]. 01/09/2022 11:15 A M Medical Record Number: [id number] Patient Account Number: [id number] Date of Birth/Sex: Treating RN: Sep 09, 1945 (76 y.o. Corey [name] Primary Care Didi Ganaway: Billey Gosling Other Clinician: Referring Humphrey Guerreiro: Treating Lamyiah Crawshaw/Extender: Mickle Asper in Treatment: 9 Visit Information History Since Last Visit Added or deleted any medications: No Patient Arrived: Ambulatory Any new allergies or adverse reactions: No Arrival Time: 11:25 Had a fall or experienced change in No Accompanied By: self activities of daily living that may affect Transfer Assistance: None risk of falls: Patient Identification Verified: Yes Signs or symptoms of abuse/neglect since last visito No Secondary Verification Process Completed: Yes Hospitalized since last visit: No Patient Requires Transmission-Based No Implantable device outside of the clinic excluding No Precautions: cellular tissue based products placed in the center Patient Has Alerts: Yes since last visit: Patient Alerts: in clinic R [place] Has Dressing in Place as Prescribed: Yes 11/17/2021 ABIs: both [place] Has Compression in Place as Prescribed: Yes 11/17/2021 TBI: R0.64 L0.58 Pain Present Now: No Electronic Signature(s) Signed: 01/09/2022 5:13:32 PM By: Deon Pilling RN, BSN Entered By: Deon Pilling on 01/09/2022 11:26:19 -------------------------------------------------------------------------------- Compression Therapy Details Patient Name: Date of Service: [name], [name] 01/09/2022 11:15 A M Medical Record Number: [id number] Patient Account Number: [id number] Date of Birth/Sex: Treating RN: [birth year] (76 y.o. Corey [name] Primary Care Alayzia Pavlock: Billey Gosling Other Clinician: Referring Rogers Ditter: Treating Tanyah Debruyne/Extender: [name] in  Treatment: 9 Compression Therapy Performed for Wound Assessment: Wound #1 Right,Lateral Lower Leg Performed By: Clinician Deon Pilling, RN Compression Type: Three Layer Post Procedure Diagnosis Same as Pre-procedure Electronic Signature(s) Signed: 01/09/2022 5:13:32 PM By: Deon Pilling RN, BSN Entered By: Deon Pilling on 01/09/2022 11:43:14 -------------------------------------------------------------------------------- Encounter Discharge Information Details Patient Name: Date of Service: [name], [name] 01/09/2022 11:15 A M Medical Record Number: [id number] Patient Account Number: [id number] Date of Birth/Sex: Treating RN: July 03, 1945 (76 y.o. Corey [name] Primary Care Kechia Yahnke: Billey Gosling Other Clinician: Referring Jie Stickels: Treating [name]/Extender: Mickle Asper in Treatment: 9 Encounter Discharge Information Items Post Procedure Vitals Discharge Condition: Stable Temperature (F): 97.9 Ambulatory Status: Ambulatory Pulse (bpm): 41 Discharge Destination: Home Respiratory Rate (breaths/min): 20 Transportation: Private Auto Blood Pressure (mmHg): 145/78 Accompanied By: self Schedule Follow-up Appointment: Yes Clinical Summary of Care: Electronic Signature(s) Signed: 01/09/2022 5:13:32 PM By: Deon Pilling RN, BSN Entered By: Deon Pilling on 01/09/2022 11:46:18 -------------------------------------------------------------------------------- Lower Extremity Assessment Details Patient Name: Date of Service: [name], [name] 01/09/2022 11:15 A M Medical Record Number: [id number] Patient Account Number: [id number] Date of Birth/Sex: Treating RN: Jul 08, 1945 (76 y.o. Corey [name] Primary Care Corey [name]: Billey Gosling Other Clinician: Referring Corey [name]: Treating Marvion Bastidas/Extender: [name] in Treatment: 9 Edema Assessment Assessed: [Left: No] [Right: Yes] Edema: [Left: N] [Right: o] Calf Left: Right: Point  of Measurement: 33 cm From Medial Instep 33 cm Ankle Left: Right: Point of Measurement: 11 cm From Medial Instep 23 cm Vascular Assessment Pulses: Dorsalis Pedis Palpable: [Right:Yes] Electronic Signature(s) Signed: 01/09/2022 5:13:32 PM By: Deon Pilling RN, BSN Entered By: Deon Pilling on 01/09/2022 11:32:52 -------------------------------------------------------------------------------- Multi Wound Chart Details Patient Name: Date of Service: [name], [name] 01/09/2022 11:15 A M Medical Record Number: [id number] Patient Account Number: [id number] Date of Birth/Sex: Treating RN: 09-09-1945 (76 y.o. Corey [name] Primary Care  Nataliyah [name]: Billey Gosling Other Clinician: Referring Corey [name]: Treating Corey [name]/Extender: Mickle Asper in Treatment: 9 Vital Signs Height(in): 66 Pulse(bpm): 41 Weight(lbs): 145 Blood Pressure(mmHg): 145/78 Body Mass Index(BMI): 23.4 Temperature(F): 97.9 Respiratory Rate(breaths/min): 20 Photos: [N/A:N/A] Right, Lateral Lower Leg N/A N/A Wound Location: Gradually Appeared N/A N/A Wounding Event: Diabetic Wound/Ulcer of the Lower N/A N/A Primary Etiology: Extremity Cataracts, Sleep Apnea, Congestive N/A N/A Comorbid History: Heart Failure, Hypertension, Myocardial Infarction, Type II Diabetes, Gout 08/15/2021 N/A N/A Date Acquired: 9 N/A N/A Weeks of Treatment: Open N/A N/A Wound Status: No N/A N/A Wound Recurrence: 13.8x8x0.2 N/A N/A Measurements L x W x D (cm) 86.708 N/A N/A A (cm) : rea 17.342 N/A N/A Volume (cm) : -301.50% N/A N/A % Reduction in A rea: -702.90% N/A N/A % Reduction in Volume: Grade 2 N/A N/A Classification: Medium N/A N/A Exudate A mount: Serosanguineous N/A N/A Exudate Type: red, brown N/A N/A Exudate Color: Distinct, outline attached N/A N/A Wound Margin: Large (67-100%) N/A N/A Granulation A mount: Red, Pink N/A N/A Granulation Quality: Small (1-33%) N/A N/A Necrotic  A mount: Fat Layer (Subcutaneous Tissue): Yes N/A N/A Exposed Structures: Fascia: No Tendon: No Muscle: No Joint: No Bone: No Medium (34-66%) N/A N/A Epithelialization: Debridement - Excisional N/A N/A Debridement: Pre-procedure Verification/Time Out 11:35 N/A N/A Taken: Lidocaine 5% topical ointment N/A N/A Pain Control: Subcutaneous, Slough N/A N/A Tissue Debrided: Skin/Subcutaneous Tissue N/A N/A Level: 84 N/A N/A Debridement A (sq cm): rea Curette N/A N/A Instrument: Minimum N/A N/A Bleeding: 0 N/A N/A Procedural Pain: 0 N/A N/A Post Procedural Pain: Procedure was tolerated well N/A N/A Debridement Treatment Response: 13.8x8x0.2 N/A N/A Post Debridement Measurements L x W x D (cm) 17.342 N/A N/A Post Debridement Volume: (cm) Compression Therapy N/A N/A Procedures Performed: Debridement Treatment Notes Wound #1 (Lower Leg) Wound Laterality: Right, Lateral Cleanser Soap and Water Discharge Instruction: May shower and wash wound with dial antibacterial soap and water prior to dressing change. Wound Cleanser Discharge Instruction: Cleanse the wound with wound cleanser prior to applying a clean dressing using gauze sponges, not tissue or cotton balls. Peri-Wound Care Topical powder collagen and blastx Discharge Instruction: mix and apply over the topical antibiotics to wound. Primary Dressing Fibracol Plus Dressing, 4x4.38 in (collagen) Discharge Instruction: Moisten collagen with saline or hydrogel until the blastx and powder collagen arrive. Compounding T opical antibiotics Discharge Instruction: apply the [facility] compounding topical antibiotics directly to wound bed. Secondary Dressing ABD Pad, 8x10 Discharge Instruction: Apply over primary dressing as directed. Woven Gauze Sponge, Non-Sterile 4x4 in Discharge Instruction: Apply over primary dressing as directed. Zetuvit Plus 4x8 in Discharge Instruction: Apply over primary dressing as  directed. Secured With Compression Wrap ThreePress (3 layer compression wrap) Discharge Instruction: Apply three layer compression as directed. Compression Stockings Add-Ons Electronic Signature(s) Signed: 01/09/2022 4:36:36 PM By: Kalman Shan DO Signed: 01/09/2022 5:13:32 PM By: Deon Pilling RN, BSN Entered By: Kalman Shan on 01/09/2022 12:18:00 -------------------------------------------------------------------------------- Multi-Disciplinary Care Plan Details Patient Name: Date of Service: [name], [name] 01/09/2022 11:15 A M Medical Record Number: [id number] Patient Account Number: [id number] Date of Birth/Sex: Treating RN: [birth year] (76 y.o. Corey [name] Primary Care Sharleen Szczesny: Billey Gosling Other Clinician: Referring Ari Engelbrecht: Treating Tarquin Welcher/Extender: Mickle Asper in Treatment: 9 Active Inactive Necrotic Tissue Nursing Diagnoses: Knowledge deficit related to management of necrotic/devitalized tissue Goals: Necrotic/devitalized tissue will be minimized in the wound bed Date Initiated: 11/01/2021 Target Resolution Date: 02/10/2022 Goal Status: Active Patient/caregiver will  verbalize understanding of reason and process for debridement of necrotic tissue Date Initiated: 11/01/2021 Date Inactivated: 12/05/2021 Target Resolution Date: 12/09/2021 Goal Status: Met Interventions: Assess patient pain level pre-, during and post procedure and prior to discharge Provide education on necrotic tissue and debridement process Treatment Activities: Apply topical anesthetic as ordered : 11/01/2021 Enzymatic debridement : 11/01/2021 Excisional debridement : 11/01/2021 Notes: Pain, Acute or Chronic Nursing Diagnoses: Pain, acute or chronic: actual or potential Potential alteration in comfort, pain Goals: Patient will verbalize adequate pain control and receive pain control interventions during procedures as needed Date Initiated: 11/01/2021 Date  Inactivated: 11/21/2021 Target Resolution Date: 12/09/2021 Goal Status: Met Patient/caregiver will verbalize comfort level met Date Initiated: 11/01/2021 Target Resolution Date: 02/10/2022 Goal Status: Active Interventions: Complete pain assessment as per visit requirements Provide education on pain management Treatment Activities: Administer pain control measures as ordered : 11/01/2021 Notes: Electronic Signature(s) Signed: 01/09/2022 5:13:32 PM By: Deon Pilling RN, BSN Entered By: Deon Pilling on 01/09/2022 11:45:12 -------------------------------------------------------------------------------- Pain Assessment Details Patient Name: Date of Service: [name], [name] 01/09/2022 11:15 A M Medical Record Number: [id number] Patient Account Number: [id number] Date of Birth/Sex: Treating RN: February 02, 1946 (76 y.o. Corey [name] Primary Care Sonali Wivell: Billey Gosling Other Clinician: Referring Hawa Henly: Treating Ceil Roderick/Extender: Mickle Asper in Treatment: 9 Active Problems Location of Pain Severity and Description of Pain Patient Has Paino No Site Locations Rate the pain. Rate the pain. Current Pain Level: 0 Pain Management and Medication Current Pain Management: Medication: No Cold Application: No Rest: No Massage: No Activity: No T.E.N.S.: No Heat Application: No Leg drop or elevation: No Is the Current Pain Management Adequate: Adequate How does your wound impact your activities of daily livingo Sleep: No Bathing: No Appetite: No Relationship With Others: No Bladder Continence: No Emotions: No Bowel Continence: No Work: No Toileting: No Drive: No Dressing: No Hobbies: No Engineer, maintenance) Signed: 01/09/2022 5:13:32 PM By: Deon Pilling RN, BSN Entered By: Deon Pilling on 01/09/2022 11:26:31 -------------------------------------------------------------------------------- Patient/Caregiver Education Details Patient Name: Date of  Service: Corey [name] 8/28/2023andnbsp11:15 A M Medical Record Number: [id number] Patient Account Number: [id number] Date of Birth/Gender: Treating RN: 1946/05/10 (76 y.o. Corey [name] Primary Care Physician: Billey Gosling Other Clinician: Referring Physician: Treating Physician/Extender: Mickle Asper in Treatment: 9 Education Assessment Education Provided To: Patient Education Topics Provided Pain: Handouts: A Guide to Pain Control Methods: Explain/Verbal Responses: Reinforcements needed Electronic Signature(s) Signed: 01/09/2022 5:13:32 PM By: Deon Pilling RN, BSN Entered By: Deon Pilling on 01/09/2022 11:45:24 -------------------------------------------------------------------------------- Wound Assessment Details Patient Name: Date of Service: [name], [name] 01/09/2022 11:15 A M Medical Record Number: [id number] Patient Account Number: [id number] Date of Birth/Sex: Treating RN: Mar 26, 1946 (76 y.o. Corey [name] Primary Care Tishia Maestre: Billey Gosling Other Clinician: Referring Eleazar Kimmey: Treating Jisell Majer/Extender: [name] in Treatment: 9 Wound Status Wound Number: 1 Primary Diabetic Wound/Ulcer of the Lower Extremity Etiology: Wound Location: Right, Lateral Lower Leg Wound Open Wounding Event: Gradually Appeared Status: Date Acquired: 08/15/2021 Comorbid Cataracts, Sleep Apnea, Congestive Heart Failure, Hypertension, Weeks Of Treatment: 9 History: Myocardial Infarction, Type II Diabetes, Gout Clustered Wound: No Photos Wound Measurements Length: (cm) 13.8 Width: (cm) 8 Depth: (cm) 0.2 Area: (cm) 86.708 Volume: (cm) 17.342 % Reduction in Area: -301.5% % Reduction in Volume: -702.9% Epithelialization: Medium (34-66%) Tunneling: No Undermining: No Wound Description Classification: Grade 2 Wound Margin: Distinct, outline attached Exudate Amount: Medium Exudate Type: Serosanguineous Exudate  Color: red, brown Foul Odor After Cleansing: No Slough/Fibrino Yes Wound Bed Granulation Amount: Large (67-100%) Exposed Structure Granulation Quality: Red, Pink Fascia Exposed: No Necrotic Amount: Small (1-33%) Fat Layer (Subcutaneous Tissue) Exposed: Yes Necrotic Quality: Adherent Slough Tendon Exposed: No Muscle Exposed: No Joint Exposed: No Bone Exposed: No Treatment Notes Wound #1 (Lower Leg) Wound Laterality: Right, Lateral Cleanser Soap and Water Discharge Instruction: May shower and wash wound with dial antibacterial soap and water prior to dressing change. Wound Cleanser Discharge Instruction: Cleanse the wound with wound cleanser prior to applying a clean dressing using gauze sponges, not tissue or cotton balls. Peri-Wound Care Topical powder collagen and blastx Discharge Instruction: mix and apply over the topical antibiotics to wound. Primary Dressing Fibracol Plus Dressing, 4x4.38 in (collagen) Discharge Instruction: Moisten collagen with saline or hydrogel until the blastx and powder collagen arrive. Compounding T opical antibiotics Discharge Instruction: apply the [facility] compounding topical antibiotics directly to wound bed. Secondary Dressing ABD Pad, 8x10 Discharge Instruction: Apply over primary dressing as directed. Woven Gauze Sponge, Non-Sterile 4x4 in Discharge Instruction: Apply over primary dressing as directed. Zetuvit Plus 4x8 in Discharge Instruction: Apply over primary dressing as directed. Secured With Compression Wrap ThreePress (3 layer compression wrap) Discharge Instruction: Apply three layer compression as directed. Compression Stockings Add-Ons Electronic Signature(s) Signed: 01/09/2022 5:13:32 PM By: Deon Pilling RN, BSN Entered By: Deon Pilling on 01/09/2022 11:38:43 -------------------------------------------------------------------------------- Vitals Details Patient Name: Date of Service: [name], [name]  01/09/2022 11:15 A M Medical Record Number: [id number] Patient Account Number: [id number] Date of Birth/Sex: Treating RN: Jun 29, 1945 (76 y.o. Corey [name] Primary Care Omah Dewalt: Billey Gosling Other Clinician: Referring Jadwiga Faidley: Treating Malichi Palardy/Extender: [name] in Treatment: 9 Vital Signs Time Taken: 11:25 Temperature (F): 97.9 Height (in): 66 Pulse (bpm): 41 Weight (lbs): 145 Respiratory Rate (breaths/min): 20 Body Mass Index (BMI): 23.4 Blood Pressure (mmHg): 145/78 Reference Range: 80 - 120 mg / dl Electronic Signature(s) Signed: 01/09/2022 5:13:32 PM By: Deon Pilling RN, BSN Entered By: Deon Pilling on 01/09/2022 11:27:25

## 2022-01-10 DIAGNOSIS — Z4800 Encounter for change or removal of nonsurgical wound dressing: Secondary | ICD-10-CM | POA: Diagnosis not present

## 2022-01-10 DIAGNOSIS — Z7982 Long term (current) use of aspirin: Secondary | ICD-10-CM

## 2022-01-10 DIAGNOSIS — I872 Venous insufficiency (chronic) (peripheral): Secondary | ICD-10-CM | POA: Diagnosis not present

## 2022-01-10 DIAGNOSIS — L97519 Non-pressure chronic ulcer of other part of right foot with unspecified severity: Secondary | ICD-10-CM | POA: Diagnosis not present

## 2022-01-10 DIAGNOSIS — N189 Chronic kidney disease, unspecified: Secondary | ICD-10-CM | POA: Diagnosis not present

## 2022-01-10 DIAGNOSIS — E1151 Type 2 diabetes mellitus with diabetic peripheral angiopathy without gangrene: Secondary | ICD-10-CM | POA: Diagnosis not present

## 2022-01-10 DIAGNOSIS — I69311 Memory deficit following cerebral infarction: Secondary | ICD-10-CM

## 2022-01-10 DIAGNOSIS — Z9181 History of falling: Secondary | ICD-10-CM

## 2022-01-10 DIAGNOSIS — E1122 Type 2 diabetes mellitus with diabetic chronic kidney disease: Secondary | ICD-10-CM | POA: Diagnosis not present

## 2022-01-10 DIAGNOSIS — I130 Hypertensive heart and chronic kidney disease with heart failure and stage 1 through stage 4 chronic kidney disease, or unspecified chronic kidney disease: Secondary | ICD-10-CM | POA: Diagnosis not present

## 2022-01-10 DIAGNOSIS — M1A30X0 Chronic gout due to renal impairment, unspecified site, without tophus (tophi): Secondary | ICD-10-CM | POA: Diagnosis not present

## 2022-01-10 DIAGNOSIS — H9190 Unspecified hearing loss, unspecified ear: Secondary | ICD-10-CM

## 2022-01-10 DIAGNOSIS — I5022 Chronic systolic (congestive) heart failure: Secondary | ICD-10-CM | POA: Diagnosis not present

## 2022-01-10 DIAGNOSIS — Z7984 Long term (current) use of oral hypoglycemic drugs: Secondary | ICD-10-CM

## 2022-01-11 ENCOUNTER — Other Ambulatory Visit: Payer: Self-pay | Admitting: *Deleted

## 2022-01-11 DIAGNOSIS — N184 Chronic kidney disease, stage 4 (severe): Secondary | ICD-10-CM

## 2022-01-12 ENCOUNTER — Encounter: Payer: Self-pay | Admitting: Cardiology

## 2022-01-12 ENCOUNTER — Ambulatory Visit: Payer: Medicare HMO | Admitting: Cardiology

## 2022-01-12 ENCOUNTER — Telehealth: Payer: Self-pay | Admitting: Cardiology

## 2022-01-12 ENCOUNTER — Ambulatory Visit
Admission: RE | Admit: 2022-01-12 | Discharge: 2022-01-12 | Disposition: A | Discharge status: Home / Self Care | Payer: Medicare HMO | Source: Ambulatory Visit | Attending: Vascular Surgery | Admitting: Vascular Surgery

## 2022-01-12 ENCOUNTER — Ambulatory Visit (INDEPENDENT_AMBULATORY_CARE_PROVIDER_SITE_OTHER)
Admission: RE | Admit: 2022-01-12 | Discharge: 2022-01-12 | Disposition: A | Discharge status: Home / Self Care | Payer: Medicare HMO | Source: Ambulatory Visit | Attending: Vascular Surgery | Admitting: Vascular Surgery

## 2022-01-12 VITALS — BP 120/60 | HR 54 | Ht 66.0 in | Wt 162.0 lb

## 2022-01-12 DIAGNOSIS — I43 Cardiomyopathy in diseases classified elsewhere: Secondary | ICD-10-CM

## 2022-01-12 DIAGNOSIS — I5020 Unspecified systolic (congestive) heart failure: Secondary | ICD-10-CM | POA: Insufficient documentation

## 2022-01-12 DIAGNOSIS — I129 Hypertensive chronic kidney disease with stage 1 through stage 4 chronic kidney disease, or unspecified chronic kidney disease: Secondary | ICD-10-CM | POA: Insufficient documentation

## 2022-01-12 DIAGNOSIS — I2510 Atherosclerotic heart disease of native coronary artery without angina pectoris: Secondary | ICD-10-CM

## 2022-01-12 DIAGNOSIS — Z79899 Other long term (current) drug therapy: Secondary | ICD-10-CM

## 2022-01-12 DIAGNOSIS — I480 Paroxysmal atrial fibrillation: Secondary | ICD-10-CM

## 2022-01-12 DIAGNOSIS — N184 Chronic kidney disease, stage 4 (severe): Secondary | ICD-10-CM

## 2022-01-12 DIAGNOSIS — R001 Bradycardia, unspecified: Secondary | ICD-10-CM

## 2022-01-12 DIAGNOSIS — E854 Organ-limited amyloidosis: Secondary | ICD-10-CM | POA: Insufficient documentation

## 2022-01-12 DIAGNOSIS — I4891 Unspecified atrial fibrillation: Secondary | ICD-10-CM | POA: Diagnosis not present

## 2022-01-12 DIAGNOSIS — S81801S Unspecified open wound, right lower leg, sequela: Secondary | ICD-10-CM | POA: Insufficient documentation

## 2022-01-12 MED ORDER — APIXABAN 5 MG PO TABS: 5.0000 mg | ORAL_TABLET | Freq: Two times a day (BID) | ORAL | 6 refills | Status: DC

## 2022-01-12 NOTE — Telephone Encounter (Signed)
*  STAT* If patient is at the pharmacy, call can be transferred to refill team.   1. Which medications need to be refilled? (please list name of each medication and dose if known) doxycycline (VIBRAMYCIN) 100 MG capsule  2. Which pharmacy/location (including street and city if local pharmacy) is medication to be sent to? [place] ([place]  - [address]  3. Do they need a 30 day or 90 day supply? [place]

## 2022-01-12 NOTE — Telephone Encounter (Signed)
Spoke with wife who is aware this medication was RXed for pt at Urgent Care for the wound infection he had on his leg.  She is aware to f/u with PCP if any s/s of infection for evaluation and treatment.  She denies any s/s at this time.  Wife was concerned because medication is on his medication list however they do not have this medication in the home.  Advised he should have completed this prescription several weeks ago and I will remove from his medication list.  Wife states understanding and was grateful for the call back and information.

## 2022-01-12 NOTE — Patient Instructions (Addendum)
Medication Instructions:  Please do not take Carvedilol Stop Aspirin. Start Eliquis 5 mg one tablet twice a day. Continue all other medications as listed.  *If you need a refill on your cardiac medications before your next appointment, please call your pharmacy*  Lab: Please have blood work in 1 month.  (CBC, BMP)  Follow-Up: At [facility], you and your health needs are our priority.  As part of our continuing mission to provide you with exceptional heart care, we have created designated Provider Care Teams.  These Care Teams include your primary Cardiologist (physician) and Advanced Practice Providers (APPs -  Physician Assistants and Nurse Practitioners) who all work together to provide you with the care you need, when you need it.  We recommend signing up for the patient portal called "MyChart".  Sign up information is provided on this After Visit Summary.  MyChart is used to connect with patients for Virtual Visits (Telemedicine).  Patients are able to view lab/test results, encounter notes, upcoming appointments, etc.  Non-urgent messages can be sent to your provider as well.   To learn more about what you can do with MyChart, go to [email or web address].    Your next appointment:   4 month(s)  The format for your next appointment:   In Person  Provider:   Dr Candee Furbish     Atrial Fibrillation  Atrial fibrillation is a type of heartbeat that is irregular or fast. If you have this condition, your heart beats without any order. This makes it hard for your heart to pump blood in a normal way. Atrial fibrillation may come and go, or it may become a long-lasting problem. If this condition is not treated, it can put you at higher risk for stroke, heart failure, and other heart problems. What are the causes? This condition may be caused by diseases that damage the heart. They include: High blood pressure. Heart failure. Heart valve disease. Heart surgery. Other causes  include: Diabetes. Thyroid disease. Being overweight. Kidney disease. Sometimes the cause is not known. What increases the risk? You are more likely to develop this condition if: You are older. You smoke. You exercise often and very hard. You have a family history of this condition. You are a man. You use drugs. You drink a lot of alcohol. You have lung conditions, such as emphysema, pneumonia, or COPD. You have sleep apnea. What are the signs or symptoms? Common symptoms of this condition include: A feeling that your heart is beating very fast. Chest pain or discomfort. Feeling short of breath. Suddenly feeling light-headed or weak. Getting tired easily during activity. Fainting. Sweating. In some cases, there are no symptoms. How is this treated? Treatment for this condition depends on underlying conditions and how you feel when you have atrial fibrillation. They include: Medicines to: Prevent blood clots. Treat heart rate or heart rhythm problems. Using devices, such as a pacemaker, to correct heart rhythm problems. Doing surgery to remove the part of the heart that sends bad signals. Closing an area where clots can form in the heart (left atrial appendage). In some cases, your doctor will treat other underlying conditions. Follow these instructions at home: Medicines Take over-the-counter and prescription medicines only as told by your doctor. Do not take any new medicines without first talking to your doctor. If you are taking blood thinners: Talk with your doctor before you take any medicines that have aspirin or NSAIDs, such as ibuprofen, in them. Take your medicine exactly as told by  your doctor. Take it at the same time each day. Avoid activities that could hurt or bruise you. Follow instructions about how to prevent falls. Wear a bracelet that says you are taking blood thinners. Or, carry a card that lists what medicines you take. Lifestyle     Do not use  any products that have nicotine or tobacco in them. These include cigarettes, e-cigarettes, and chewing tobacco. If you need help quitting, ask your doctor. Eat heart-healthy foods. Talk with your doctor about the right eating plan for you. Exercise regularly as told by your doctor. Do not drink alcohol. Lose weight if you are overweight. Do not use drugs, including cannabis. General instructions If you have a condition that causes breathing to stop for a short period of time (apnea), treat it as told by your doctor. Keep a healthy weight. Do not use diet pills unless your doctor says they are safe for you. Diet pills may make heart problems worse. Keep all follow-up visits as told by your doctor. This is important. Contact a doctor if: You notice a change in the speed, rhythm, or strength of your heartbeat. You are taking a blood-thinning medicine and you get more bruising. You get tired more easily when you move or exercise. You have a sudden change in weight. Get help right away if:  You have pain in your chest or your belly (abdomen). You have trouble breathing. You have side effects of blood thinners, such as blood in your vomit, poop (stool), or pee (urine), or bleeding that cannot stop. You have any signs of a stroke. "BE FAST" is an easy way to remember the main warning signs: B - Balance. Signs are dizziness, sudden trouble walking, or loss of balance. E - Eyes. Signs are trouble seeing or a change in how you see. F - Face. Signs are sudden weakness or loss of feeling in the face, or the face or eyelid drooping on one side. A - Arms. Signs are weakness or loss of feeling in an arm. This happens suddenly and usually on one side of the body. S - Speech. Signs are sudden trouble speaking, slurred speech, or trouble understanding what people say. T - Time. Time to call emergency services. Write down what time symptoms started. You have other signs of a stroke, such as: A sudden, very  bad headache with no known cause. Feeling like you may vomit (nausea). Vomiting. A seizure. These symptoms may be an emergency. Do not wait to see if the symptoms will go away. Get medical help right away. Call your local emergency services (911 in the [place] Do not drive yourself to the hospital. Summary Atrial fibrillation is a type of heartbeat that is irregular or fast. You are at higher risk of this condition if you smoke, are older, have diabetes, or are overweight. Follow your doctor's instructions about medicines, diet, exercise, and follow-up visits. Get help right away if you have signs or symptoms of a stroke. Get help right away if you cannot catch your breath, or you have chest pain or discomfort. This information is not intended to replace advice given to you by your health care provider. Make sure you discuss any questions you have with your health care provider. Document Revised: 10/23/2018 Document Reviewed: 10/23/2018 [employer] Patient Education  Anthem

## 2022-01-12 NOTE — Progress Notes (Signed)
Cardiology Office Note:    Date:  01/12/2022   ID:  Corey [name], DOB 03/30/1946, MRN [id number]  PCP:  [name], MD   [facility] Providers Cardiologist:  Candee Furbish, MD     Referring MD: [name], MD    History of Present Illness:    Corey [name] is a 76 y.o. male here for follow-up systolic heart failure, amyloid heart disease.  Overall has been fairly stable over the last few months.  Still short of breath with activity but it is not markedly increased.  Right lower extremity wound previously.  Newly discovered atrial fibrillation on 01/12/2022.  Started Eliquis.  On 09/27/21 his wife called the office and stated he was noticeably short of breath. He was typically fine if not moving around. His wife reported a 15 lb gain over 2 weeks, but patient weight recorded while on phone was 178.5 lb (last weight in chart 175 lb in March) reflecting 3.5 lb gain over unknown time. Previously he had been advised by Richardson Dopp PA-C on 5/10 it was ok to take extra torsemide 20 mg for increased swelling, shortness of breath. He was going to take an additional dose and weigh himself the next day. He was scheduled for a DOD appointment today.   Previously here for cardiac amyloidosis on tafamidis with prior pericardial effusion status post window, CAD combined systolic and diastolic heart failure with prior PEA arrest hypertension hyperlipidemia diabetes type 2 stroke obstructive sleep apnea cognitive impairment and prostate cancer.  Back in May 2022 was having dyspnea on exertion and possible anginal symptoms.  He scheduled for a left and right heart cath.  During that admission he was found to have an 80% occlusion of his LAD with severely elevated left ventricular end-diastolic pressures.  Symptoms were probably more compatible with pressure volume overload and he was started on diuretics.  His echocardiogram on 09/2020 showed EF of 35 to 40%.  Had a large pericardial effusion at the time.   He underwent a pericardial window and biopsies were taken that were compatible with amyloidosis possible AL amyloid low doses.  However, serum and urine studies were not compatible with AL amyloidosis.  He then underwent PYP scan that was equivocal.  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   Family history showed 2 brothers with heart failure 1 deceased 1 sister died from heart failure.  He has a son that is 74 years old without any issues.   He lost a significant amount of weight with diuresis.  His torsemide was decreased.  Weight has been steady at 156 pounds at last office visit with heart failure team.  PYP scan was delayed because of radiotracer shortage.   He underwent elective L inguinal hernia repair 06/16/21.   At his last appointment he was doing well with improvement in his breathing.He saw Dr. Audie Box for amyloidosis and started on tafamadis. He had been taking it for 3 weeks and tolerating it well.  His wife noticed occasional bilateral LE edema. His L ankle tends to swell more than his R ankle. She will give him an extra Lasix when she notices the swelling.    During a visit on 09/29/2021: He is accompanied by a family member. He is not feeling good today due to shortness of breath and LE edema in his feet. There has not been much of a change on torsemide. Currently he is taking 20 mg torsemide on MWF.  Yesterday  they tried an extra dose but noticed no improvement.    His Jardiance was recently discontinued.   Hew denies any palpitations, or chest pain. No headaches, syncope, orthopnea, or PND.    Past Medical History:  Diagnosis Date   Cataract    CHF (congestive heart failure) ([place])    Chronic heart failure with preserved ejection fraction (HFpEF) ([place])    Colon polyp 2003   Dr [name]; F/U was to be 2008( not completed)   CVA (cerebral infarction) 2014   Diabetes mellitus    Diverticulosis 2003   Dyspnea    with exertion   Gout    Hypertension     Myocardial infarction ([place]) 2014   ?? PEA cardiac arrest   OSA (obstructive sleep apnea) 03/27/2018   Pneumonia 09/29/2011   Avelox X 10 days as OP   Prostate cancer ([place])    Renal insufficiency    Seizures ([place])    not treated for seizure disorder; had a seizure after stroke 2014; no seizure since then    Past Surgical History:  Procedure Laterality Date   COLONOSCOPY W/ POLYPECTOMY  2003   no F/U ([place] discussed 12/03/12)   INGUINAL HERNIA REPAIR Left 06/16/2021   Procedure: LAPAROSCOPIC, POSSIBLY OPEN LEFT INGUINAL HERNIA REPAIR WITH MESH;  Surgeon: Felicie Morn, MD;  Location: [facility];  Service: General;  Laterality: Left;   INSERTION OF MESH N/A 07/16/2017   Procedure: INSERTION OF MESH;  Surgeon: [name], MD;  Location: [facility];  Service: General;  Laterality: N/A;   PEG PLACEMENT  2014   PEG TUBE REMOVAL  2014   PROSTATE BIOPSY     RIGHT/LEFT HEART CATH AND CORONARY ANGIOGRAPHY N/A 09/20/2020   Procedure: RIGHT/LEFT HEART CATH AND CORONARY ANGIOGRAPHY;  Surgeon: Leonie Man, MD;  Location: [place] [facility];  Service: Cardiovascular;  Laterality: N/A;   TEE WITHOUT CARDIOVERSION N/A 09/27/2020   Procedure: TRANSESOPHAGEAL ECHOCARDIOGRAM (TEE);  Surgeon: Wonda Olds, MD;  Location[facility];  Service: Thoracic;  Laterality: N/A;   TRACHEOSTOMY  2014   was closed after hospital stay   [place] N/A 07/16/2017   Procedure: [place];  Surgeon: [name], MD;  Location: [facility];  Service: General;  Laterality: N/A;   wrist aspiration  02/16/12    monosodium urate crystals; Dr Caralyn Guile    Current Medications: Current Meds  Medication Sig   ACCU-CHEK AVIVA PLUS test strip TEST BLOOD SUGAR TWICE DAILY AS DIRECTED   ACCU-CHEK AVIVA PLUS test strip Use as instructed   albuterol (VENTOLIN HFA) 108 (90 Base) MCG/ACT inhaler Inhale 2 puffs into the lungs every 4 (four) hours as needed for wheezing or shortness of  breath.   allopurinol (ZYLOPRIM) 100 MG tablet TAKE 1/2 TABLET ONE TIME DAILY   apixaban (ELIQUIS) 5 MG TABS tablet Take 1 tablet (5 mg total) by mouth 2 (two) times daily.   bismuth subsalicylate (PEPTO BISMOL) 262 MG/15ML suspension Take 30 mLs by mouth every 6 (six) hours as needed for indigestion.   colchicine (COLCRYS) 0.6 MG tablet Take 2 tabs once and then one tab one hour later as needed for gout   doxycycline (VIBRAMYCIN) 100 MG capsule Take 1 capsule (100 mg total) by mouth 2 (two) times daily.   glipiZIDE (GLUCOTROL XL) 2.5 MG 24 hr tablet Take 1 tablet (2.5 mg total) by mouth daily with breakfast.   hydrALAZINE (APRESOLINE) 50 MG tablet Take 2 tablets (100  mg total) by mouth 3 (three) times daily.   JANUVIA 25 MG tablet Take 1 tablet by mouth once daily   nitroGLYCERIN (NITROSTAT) 0.4 MG SL tablet Place 0.4 mg under the tongue every 5 (five) minutes as needed for chest pain.   rosuvastatin (CRESTOR) 40 MG tablet Take 1 tablet (40 mg total) by mouth daily.   Tafamidis (VYNDAMAX) 61 MG CAPS Take 61 mg by mouth daily.   torsemide (DEMADEX) 20 MG tablet Take 1 tablet (20 mg total) by mouth daily.   TRUEplus Lancets 33G MISC USE 4 (FOUR) TIMES DAILY   [DISCONTINUED] aspirin EC 81 MG tablet Take 1 tablet (81 mg total) by mouth daily.   [DISCONTINUED] carvedilol (COREG) 6.25 MG tablet Take 6.25 mg by mouth 2 (two) times daily.     Allergies:   Hydrochlorothiazide   Social History   Socioeconomic History   Marital status: Married    Spouse name: [name]   Number of children: 2   Years of education: 12   Highest education level: 12th grade  Occupational History   Not on file  Tobacco Use   Smoking status: Former    Packs/day: 0.25    Years: 1.00    Total pack years: 0.25    Types: Cigarettes    Quit date: 05/15/1968    Years since quitting: 53.6   Smokeless tobacco: Never   Tobacco comments:    smoked [place], up to 1-2 cigarettes/ day  Vaping Use   Vaping Use: Never  used  Substance and Sexual Activity   Alcohol use: No    Alcohol/week: 0.0 standard drinks of alcohol   Drug use: No   Sexual activity: Yes  Other Topics Concern   Not on file  Social History Narrative   Walks three times a week, 2-3 miles, occasional walks on treadmill       Patient is right-handed. He lives with his wife in a one level home. He drinks 1 diet Mt. Dew a day and rarely has coffee. He walks 3 x a week for exercise.   Social Determinants of Health   Financial Resource Strain: Medium Risk (10/18/2020)   Overall Financial Resource Strain (CARDIA)    Difficulty of Paying Living Expenses: Somewhat hard  Food Insecurity: No Food Insecurity (11/18/2021)   Hunger Vital Sign    Worried About Running Out of Food in the Last Year: Never true    Ran Out of Food in the Last Year: Never true  Transportation Needs: No Transportation Needs (12/22/2021)   PRAPARE - Hydrologist (Medical): No    Lack of Transportation (Non-Medical): No  Physical Activity: Not on file  Stress: Not on file  Social Connections: Not on file     Family History: The patient's family history includes Cancer in his father; Diabetes in his father and mother; Heart failure in his brother and mother; Hypertension in his father and mother; Prostate cancer in his father. There is no history of Stroke, Heart disease, or Colon cancer.  ROS:   Please see the history of present illness.     All other systems reviewed and are negative.  EKGs/Labs/Other Studies Reviewed:    The following studies were reviewed today: Echocardiogram 09/30/2020:    1. Trace pericardial fluid posterior and around right AV groove. Compared  to prior study 09/21/20, pericardial effusion has essentially resolved.   2. Left ventricular ejection fraction, by estimation, is 40%. The left  ventricle has moderately decreased  function. The left ventricle  demonstrates global hypokinesis. There is severe left  ventricular  hypertrophy.   3. Right ventricular systolic function is moderately reduced. The right  ventricular size is normal. Severely increased right ventricular wall  thickness.   4. Trivial mitral valve regurgitation.   5. The inferior vena cava is dilated in size with <50% respiratory  variability, suggesting right atrial pressure of 15 mmHg.   Cardiac catheterization 09/20/2020: SUMMARY Severe single-vessel disease with heavily calcified mid LAD 80% stenosis Acute Combined Systolic and Diastolic Heart Failure (systolic function appears to be reduced, to conserve contrast, LV gram not performed), but LVEDP 32 mmHg and PCWP 28 mmHg. Systemic Hypertension with Mean Arterial Pressure 123 mmHg Secondary Pulmonary Hypertension: PAP 67/19 mmHg-mean 39 mmHg with RVP-EDP of 67/2 mmHg-15 mmHg, RVP and RAP of 14 mmHg. Moderate to severely reduced Cardiac Output-Index: 4.04-2.09    EKG:  EKG is  ordered today.  The ekg ordered today demonstrates atrial fibrillation with slow ventricular response heart rate 46 bpm.  Prior showed marked first-degree AV block.  Recent Labs: 01/05/2022: ALT 31; BUN 56; Creatinine, Ser 3.75; Hemoglobin 10.6; Platelets 128.0; Potassium 4.3; Pro B Natriuretic peptide (BNP) 1,740.0; Sodium 140  Recent Lipid Panel    Component Value Date/Time   CHOL 88 11/23/2020 1056   TRIG 64.0 11/23/2020 1056   HDL 41.90 11/23/2020 1056   CHOLHDL 2 11/23/2020 1056   VLDL 12.8 11/23/2020 1056   LDLCALC 34 11/23/2020 1056   LDLCALC 61 01/28/2020 1000     Risk Assessment/Calculations:              Physical Exam:    VS:  BP 120/60 (BP Location: Left Arm, Patient Position: Sitting, Cuff Size: Normal)   Pulse (!) 54   Ht '5\' 6"'$  (1.676 m)   Wt 162 lb (73.5 kg)   SpO2 98%   BMI 26.15 kg/m     Wt Readings from Last 3 Encounters:  01/12/22 162 lb (73.5 kg)  01/05/22 165 lb (74.8 kg)  10/24/21 157 lb (71.2 kg)     GEN:  Well nourished, well developed in no acute  distress HEENT: Normal NECK: No JVD; No carotid bruits LYMPHATICS: No lymphadenopathy CARDIAC: Bradycardic fairly regular, no murmurs, no rubs, gallops RESPIRATORY:  Clear to auscultation without rales, wheezing or rhonchi  ABDOMEN: Soft, non-tender, non-distended MUSCULOSKELETAL: Chronic 2+ lower extremity edema; No deformity  SKIN: Warm and dry NEUROLOGIC:  Alert and oriented x 3 PSYCHIATRIC:  Normal affect   ASSESSMENT:    1. Bradycardia   2. Medication management   3. Atrial fibrillation, unspecified type (HCC)   4. Paroxysmal atrial fibrillation ([place])   5. Wound of right lower extremity, sequela   6. Benign hypertension with chronic kidney disease, stage IV ([place])   7. Coronary artery disease involving native coronary artery of native heart without angina pectoris   8. HFrEF (heart failure with reduced ejection fraction) (HCC)   9. Cardiac amyloidosis (HCC)    PLAN:    In order of problems listed above:  Atrial fibrillation paroxysmal Newly discovered on 01/12/2022 on EKG showing heart rate of 45 bpm with atrial fibrillation slow ventricular response.  Make sure he is not taking carvedilol. Starting Eliquis 5 mg twice a day.  Watch for signs of bleeding.  Hemoglobin last checked 10.6.  Platelets 128.  We will check again in 1 month. Stop aspirin 81 mg.  First-degree AV block Marked first-degree AV block on prior EKG of 560 ms.  One-to-one correlation with QRS.  We stopped carvedilol at that time.  Carvedilol did show back up on his medication list.  We will make sure that it is discontinued today.  Chronic systolic heart failure EF 35 to 40% Unable to take carvedilol because of significant first-degree AV block as well as bradycardia. Amyloidosis Unable to utilize ARN I secondary to chronic kidney disease. Continue with torsemide as above.  Nephrology following as well.  Chronic kidney disease stage IV Getting fistula consultation for anticipated hemodialysis. Continue  with torsemide for fluid management.  Appreciate assistance from nephrology.  Amyloidosis Currently taking Vyndamax or tafamidis 61 mg daily.  Appreciate prior assistance from Dr. Audie Box  Coronary artery disease 80% stenosis LAD.  Severe elevated LVEDP on heart catheterization.  Pericardial effusion Post pericardial window.  Biopsy compatible with amyloidosis          Medication Adjustments/Labs and Tests Ordered: Current medicines are reviewed at length with the patient today.  Concerns regarding medicines are outlined above.  Orders Placed This Encounter  Procedures   CBC   Basic metabolic panel   EKG 51-[facility]   Meds ordered this encounter  Medications   apixaban (ELIQUIS) 5 MG TABS tablet    Sig: Take 1 tablet (5 mg total) by mouth 2 (two) times daily.    Dispense:  60 tablet    Refill:  6    Patient Instructions  Medication Instructions:  Please do not take Carvedilol Stop Aspirin. Start Eliquis 5 mg one tablet twice a day. Continue all other medications as listed.  *If you need a refill on your cardiac medications before your next appointment, please call your pharmacy*  Lab: Please have blood work in 1 month.  (CBC, BMP)  Follow-Up: At [facility], you and your health needs are our priority.  As part of our continuing mission to provide you with exceptional heart care, we have created designated Provider Care Teams.  These Care Teams include your primary Cardiologist (physician) and Advanced Practice Providers (APPs -  Physician Assistants and Nurse Practitioners) who all work together to provide you with the care you need, when you need it.  We recommend signing up for the patient portal called "MyChart".  Sign up information is provided on this After Visit Summary.  MyChart is used to connect with patients for Virtual Visits (Telemedicine).  Patients are able to view lab/test results, encounter notes, upcoming appointments, etc.  Non-urgent messages can  be sent to your provider as well.   To learn more about what you can do with MyChart, go to [email or web address].    Your next appointment:   4 month(s)  The format for your next appointment:   In Person  Provider:   Dr Candee Furbish     Atrial Fibrillation  Atrial fibrillation is a type of heartbeat that is irregular or fast. If you have this condition, your heart beats without any order. This makes it hard for your heart to pump blood in a normal way. Atrial fibrillation may come and go, or it may become a long-lasting problem. If this condition is not treated, it can put you at higher risk for stroke, heart failure, and other heart problems. What are the causes? This condition may be caused by diseases that damage the heart. They include: High blood pressure. Heart failure. Heart valve disease. Heart surgery. Other causes include: Diabetes. Thyroid disease. Being overweight. Kidney disease. Sometimes the cause is not known. What increases the risk? You are more likely  to develop this condition if: You are older. You smoke. You exercise often and very hard. You have a family history of this condition. You are a man. You use drugs. You drink a lot of alcohol. You have lung conditions, such as emphysema, pneumonia, or COPD. You have sleep apnea. What are the signs or symptoms? Common symptoms of this condition include: A feeling that your heart is beating very fast. Chest pain or discomfort. Feeling short of breath. Suddenly feeling light-headed or weak. Getting tired easily during activity. Fainting. Sweating. In some cases, there are no symptoms. How is this treated? Treatment for this condition depends on underlying conditions and how you feel when you have atrial fibrillation. They include: Medicines to: Prevent blood clots. Treat heart rate or heart rhythm problems. Using devices, such as a pacemaker, to correct heart rhythm problems. Doing surgery to  remove the part of the heart that sends bad signals. Closing an area where clots can form in the heart (left atrial appendage). In some cases, your doctor will treat other underlying conditions. Follow these instructions at home: Medicines Take over-the-counter and prescription medicines only as told by your doctor. Do not take any new medicines without first talking to your doctor. If you are taking blood thinners: Talk with your doctor before you take any medicines that have aspirin or NSAIDs, such as ibuprofen, in them. Take your medicine exactly as told by your doctor. Take it at the same time each day. Avoid activities that could hurt or bruise you. Follow instructions about how to prevent falls. Wear a bracelet that says you are taking blood thinners. Or, carry a card that lists what medicines you take. Lifestyle     Do not use any products that have nicotine or tobacco in them. These include cigarettes, e-cigarettes, and chewing tobacco. If you need help quitting, ask your doctor. Eat heart-healthy foods. Talk with your doctor about the right eating plan for you. Exercise regularly as told by your doctor. Do not drink alcohol. Lose weight if you are overweight. Do not use drugs, including cannabis. General instructions If you have a condition that causes breathing to stop for a short period of time (apnea), treat it as told by your doctor. Keep a healthy weight. Do not use diet pills unless your doctor says they are safe for you. Diet pills may make heart problems worse. Keep all follow-up visits as told by your doctor. This is important. Contact a doctor if: You notice a change in the speed, rhythm, or strength of your heartbeat. You are taking a blood-thinning medicine and you get more bruising. You get tired more easily when you move or exercise. You have a sudden change in weight. Get help right away if:  You have pain in your chest or your belly (abdomen). You have  trouble breathing. You have side effects of blood thinners, such as blood in your vomit, poop (stool), or pee (urine), or bleeding that cannot stop. You have any signs of a stroke. "BE FAST" is an easy way to remember the main warning signs: B - Balance. Signs are dizziness, sudden trouble walking, or loss of balance. E - Eyes. Signs are trouble seeing or a change in how you see. F - Face. Signs are sudden weakness or loss of feeling in the face, or the face or eyelid drooping on one side. A - Arms. Signs are weakness or loss of feeling in an arm. This happens suddenly and usually on one side of  the body. S - Speech. Signs are sudden trouble speaking, slurred speech, or trouble understanding what people say. T - Time. Time to call emergency services. Write down what time symptoms started. You have other signs of a stroke, such as: A sudden, very bad headache with no known cause. Feeling like you may vomit (nausea). Vomiting. A seizure. These symptoms may be an emergency. Do not wait to see if the symptoms will go away. Get medical help right away. Call your local emergency services (911 in the [place] Do not drive yourself to the hospital. Summary Atrial fibrillation is a type of heartbeat that is irregular or fast. You are at higher risk of this condition if you smoke, are older, have diabetes, or are overweight. Follow your doctor's instructions about medicines, diet, exercise, and follow-up visits. Get help right away if you have signs or symptoms of a stroke. Get help right away if you cannot catch your breath, or you have chest pain or discomfort. This information is not intended to replace advice given to you by your health care provider. Make sure you discuss any questions you have with your health care provider. Document Revised: 10/23/2018 Document Reviewed: 10/23/2018 Elsevier Patient Education  [place], MD  01/12/2022 10:17 AM    [place]

## 2022-01-17 ENCOUNTER — Other Ambulatory Visit: Payer: Medicare HMO

## 2022-01-17 ENCOUNTER — Encounter (HOSPITAL_BASED_OUTPATIENT_CLINIC_OR_DEPARTMENT_OTHER): Payer: Medicare HMO | Attending: Internal Medicine | Admitting: Internal Medicine

## 2022-01-17 DIAGNOSIS — I5042 Chronic combined systolic (congestive) and diastolic (congestive) heart failure: Secondary | ICD-10-CM | POA: Diagnosis not present

## 2022-01-17 DIAGNOSIS — L97918 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 with other specified severity: Secondary | ICD-10-CM | POA: Diagnosis not present

## 2022-01-17 DIAGNOSIS — I87311 Chronic venous hypertension (idiopathic) with ulcer of right lower extremity: Secondary | ICD-10-CM | POA: Diagnosis not present

## 2022-01-17 DIAGNOSIS — I25119 Atherosclerotic heart disease of native coronary artery with unspecified angina pectoris: Secondary | ICD-10-CM | POA: Diagnosis not present

## 2022-01-17 DIAGNOSIS — Z8546 Personal history of malignant neoplasm of prostate: Secondary | ICD-10-CM | POA: Diagnosis not present

## 2022-01-17 DIAGNOSIS — E11622 Type 2 diabetes mellitus with other skin ulcer: Secondary | ICD-10-CM | POA: Insufficient documentation

## 2022-01-17 DIAGNOSIS — G8929 Other chronic pain: Secondary | ICD-10-CM | POA: Insufficient documentation

## 2022-01-17 DIAGNOSIS — M109 Gout, unspecified: Secondary | ICD-10-CM | POA: Diagnosis not present

## 2022-01-17 DIAGNOSIS — E1122 Type 2 diabetes mellitus with diabetic chronic kidney disease: Secondary | ICD-10-CM | POA: Diagnosis not present

## 2022-01-17 DIAGNOSIS — E11621 Type 2 diabetes mellitus with foot ulcer: Secondary | ICD-10-CM | POA: Insufficient documentation

## 2022-01-17 DIAGNOSIS — I130 Hypertensive heart and chronic kidney disease with heart failure and stage 1 through stage 4 chronic kidney disease, or unspecified chronic kidney disease: Secondary | ICD-10-CM | POA: Diagnosis not present

## 2022-01-17 DIAGNOSIS — G4730 Sleep apnea, unspecified: Secondary | ICD-10-CM | POA: Insufficient documentation

## 2022-01-17 DIAGNOSIS — Z87891 Personal history of nicotine dependence: Secondary | ICD-10-CM | POA: Diagnosis not present

## 2022-01-17 NOTE — Progress Notes (Signed)
Corey, [name] ([id number]) Visit Report for 01/17/2022 Arrival Information Details Patient Name: Date of Service: Corey [name]. 01/17/2022 10:45 A [name] Medical Record Number: [id number] Patient Account Number: [id number] Date of Birth/Sex: Treating RN: 10-06-1945 (76 y.o. Corey [name], Corey [name] Primary Care Corey [name]: Corey [name] Other Clinician: Referring Corey [name]: Treating Corey [name]/Extender: Corey [name] in [name]: 11 Visit Information History Since Last Visit Added or deleted any medications: No Patient Arrived: Ambulatory Any new allergies or adverse reactions: No Arrival Time: 11:06 Had a fall or experienced change in No Accompanied By: self activities of daily living that may affect Transfer Assistance: None risk of falls: Patient Identification Verified: Yes Signs or symptoms of abuse/neglect since last visito No Secondary Verification Process Completed: Yes Hospitalized since last visit: No Patient Requires Transmission-Based No Implantable device outside of the clinic excluding No Precautions: cellular tissue based products placed in the center Patient Has Alerts: Yes since last visit: Patient Alerts: in clinic Corey [name] Has Dressing in Place as Prescribed: Yes 11/17/2021 ABIs: both [place] Has Compression in Place as Prescribed: Yes 11/17/2021 TBI: R0.64 L0.58 Pain Present Now: No Electronic Signature(s) Signed: 01/17/2022 4:25:50 PM By: Corey Hammock RN Entered By: Corey [name] on 01/17/2022 11:06:22 -------------------------------------------------------------------------------- Compression Therapy Details Patient Name: Date of Service: Corey [name] Medical Record Number: [id number] Patient Account Number: [id number] Date of Birth/Sex: Treating RN: 1945-11-29 (76 y.o. Corey [name] Primary Care Reegan Mctighe: Corey [name] Other Clinician: Referring Corey [name]: Treating Corey [name]/Extender: Corey [name] in [name]: 11 Compression Therapy Performed for Wound Assessment: Wound #1 Right,Lateral Lower Leg Performed By: Clinician Corey Hammock, RN Compression Type: Three Layer Post Procedure Diagnosis Same as Pre-procedure Electronic Signature(s) Signed: 01/17/2022 4:25:50 PM By: Corey Hammock RN Entered By: Corey [name] on 01/17/2022 11:20:06 -------------------------------------------------------------------------------- Encounter Discharge Information Details Patient Name: Date of Service: Corey [name] Medical Record Number: [id number] Patient Account Number: [id number] Date of Birth/Sex: Treating RN: [birth year] (76 y.o. Corey [name] Primary Care Kia Varnadore: Corey [name] Other Clinician: Referring Argentina Kosch: Treating Corey [name]/Extender: Corey [name] in [name]: 11 Encounter Discharge Information Items Post Procedure Vitals Discharge Condition: Stable Temperature (F): 97.8 Ambulatory Status: Ambulatory Pulse (bpm): 60 Discharge Destination: Home Respiratory Rate (breaths/min): 17 Transportation: Private Auto Blood Pressure (mmHg): 162/89 Accompanied By: self Schedule Follow-up Appointment: Yes Clinical Summary of Care: Electronic Signature(s) Signed: 01/17/2022 4:25:50 PM By: Corey Hammock RN Entered By: Corey [name] on 01/17/2022 11:24:32 -------------------------------------------------------------------------------- Lower Extremity Assessment Details Patient Name: Date of Service: Corey [name] Medical Record Number: [id number] Patient Account Number: [id number] Date of Birth/Sex: Treating RN: [birth year] (76 y.o. Corey [name] Primary Care Cash Duce: Corey [name] Other Clinician: Referring Zyonna Vardaman: Treating Theia Dezeeuw/Extender: Corey [name]: 11 Edema Assessment Assessed: [Left: No] Corey [name]: Yes] Edema: [Left: N]  [Right: o] Calf Left: Right: Point of Measurement: 33 cm From Medial Instep 39 cm Ankle Left: Right: Point of Measurement: 11 cm From Medial Instep 22.5 cm Vascular Assessment Pulses: Dorsalis Pedis Palpable: [Right:Yes] Posterior Tibial Palpable: [Right:Yes] Electronic Signature(s) Signed: 01/17/2022 4:25:50 PM By: Corey Hammock RN Entered By: Corey [name] on 01/17/2022 11:08:46 -------------------------------------------------------------------------------- Multi Wound Chart Details Patient Name: Date of Service: Corey [name] Medical Record Number: [id number] Patient Account Number: [id number] Date of Birth/Sex: Treating RN: 27-Jun-1945 (76 y.o. [name]) Primary Care Corey [name]: Corey [name],  [name] Other Clinician: Referring Corey [name]: Treating Corey [name]/Extender: Corey [name] in [name]: 11 Vital Signs Height(in): 66 Pulse(bpm): 60 Weight(lbs): 145 Blood Pressure(mmHg): 162/89 Body Mass Index(BMI): 23.4 Temperature(F): 97.8 Respiratory Rate(breaths/min): 17 Photos: [N/A:N/A] Right, Lateral Lower Leg N/A N/A Wound Location: Gradually Appeared N/A N/A Wounding Event: Diabetic Wound/Ulcer of the Lower N/A N/A Primary Etiology: Extremity Cataracts, Sleep Apnea, Congestive N/A N/A Comorbid History: Heart Failure, Hypertension, Myocardial Infarction, Type II Diabetes, Gout 08/15/2021 N/A N/A Date Acquired: 11 N/A N/A [name] of [name]: Open N/A N/A Wound Status: No N/A N/A Wound Recurrence: 12.5x7x0.2 N/A N/A Measurements L x W x D (cm) 68.722 N/A N/A A (cm) : rea 13.744 N/A N/A Volume (cm) : -218.20% N/A N/A % Reduction in A rea: -536.30% N/A N/A % Reduction in Volume: Grade 2 N/A N/A Classification: Medium N/A N/A Exudate A mount: Serosanguineous N/A N/A Exudate Type: red, brown N/A N/A Exudate Color: Distinct, outline attached N/A N/A Wound Margin: Medium (34-66%) N/A N/A Granulation A  mount: Red, Pink N/A N/A Granulation Quality: Medium (34-66%) N/A N/A Necrotic A mount: Fat Layer (Subcutaneous Tissue): Yes N/A N/A Exposed Structures: Fascia: No Tendon: No Muscle: No Joint: No Bone: No Medium (34-66%) N/A N/A Epithelialization: Debridement - Excisional N/A N/A Debridement: Pre-procedure Verification/Time Out 11:15 N/A N/A Taken: Lidocaine 5% topical ointment N/A N/A Pain Control: Subcutaneous, Slough N/A N/A Tissue Debrided: Skin/Subcutaneous Tissue N/A N/A Level: 87.5 N/A N/A Debridement A (sq cm): rea Curette N/A N/A Instrument: Minimum N/A N/A Bleeding: Pressure N/A N/A Hemostasis A chieved: 0 N/A N/A Procedural Pain: 0 N/A N/A Post Procedural Pain: Procedure was tolerated well N/A N/A Debridement [name] Response: 12.5x7x0.2 N/A N/A Post Debridement Measurements L x W x D (cm) 13.744 N/A N/A Post Debridement Volume: (cm) Compression Therapy N/A N/A Procedures Performed: Debridement [name] Notes Wound #1 (Lower Leg) Wound Laterality: Right, Lateral Cleanser Soap and Water Discharge Instruction: May shower and wash wound with dial antibacterial soap and water prior to dressing change. Wound Cleanser Discharge Instruction: Cleanse the wound with wound cleanser prior to applying a clean dressing using gauze sponges, not tissue or cotton balls. Peri-Wound Care Topical collagen and blastx Discharge Instruction: apply over bthe topical compounding antibiotics. Primary Dressing Fibracol Plus Dressing, 4x4.38 in (collagen) Discharge Instruction: Moisten collagen with saline or hydrogel until the blastx and powder collagen arrive. Secondary Dressing ABD Pad, 8x10 Discharge Instruction: Apply over primary dressing as directed. Woven Gauze Sponge, Non-Sterile 4x4 in Discharge Instruction: Apply over primary dressing as directed. Zetuvit Plus 4x8 in Discharge Instruction: Apply over primary dressing as directed. Secured  With Compression Wrap ThreePress (3 layer compression wrap) Discharge Instruction: Apply three layer compression as directed. Compression Stockings Add-Ons Electronic Signature(s) Signed: 01/17/2022 12:26:30 PM By: Kalman Shan DO Entered By: Kalman Shan on 01/17/2022 11:50:50 -------------------------------------------------------------------------------- Multi-Disciplinary Care Plan Details Patient Name: Date of Service: Corey [name] Medical Record Number: [id number] Patient Account Number: [id number] Date of Birth/Sex: Treating RN: 03-18-1946 (76 y.o. Corey [name] Primary Care Yeray Tomas: Corey [name] Other Clinician: Referring Laquiesha Piacente: Treating Robina Hamor/Extender: Corey [name] in [name]: 11 Active Inactive Necrotic Tissue Nursing Diagnoses: Knowledge deficit related to management of necrotic/devitalized tissue Goals: Necrotic/devitalized tissue will be minimized in the wound bed Date Initiated: 11/01/2021 Target Resolution Date: 02/10/2022 Goal Status: Active Patient/caregiver will verbalize understanding of reason and process for debridement of necrotic tissue Date Initiated: 11/01/2021 Date Inactivated: 12/05/2021 Target Resolution Date: 12/09/2021 Goal Status: Met Interventions: Assess  patient pain level pre-, during and post procedure and prior to discharge Provide education on necrotic tissue and debridement process [name] Activities: Apply topical anesthetic as ordered : 11/01/2021 Enzymatic debridement : 11/01/2021 Excisional debridement : 11/01/2021 Notes: Pain, Acute or Chronic Nursing Diagnoses: Pain, acute or chronic: actual or potential Potential alteration in comfort, pain Goals: Patient will verbalize adequate pain control and receive pain control interventions during procedures as needed Date Initiated: 11/01/2021 Date Inactivated: 11/21/2021 Target Resolution Date: 12/09/2021 Goal Status:  Met Patient/caregiver will verbalize comfort level met Date Initiated: 11/01/2021 Target Resolution Date: 02/10/2022 Goal Status: Active Interventions: Complete pain assessment as per visit requirements Provide education on pain management [name] Activities: Administer pain control measures as ordered : 11/01/2021 Notes: Electronic Signature(s) Signed: 01/17/2022 4:25:50 PM By: Corey Hammock RN Entered By: Corey [name] on 01/17/2022 11:18:44 -------------------------------------------------------------------------------- Pain Assessment Details Patient Name: Date of Service: Corey [name] Medical Record Number: [id number] Patient Account Number: [id number] Date of Birth/Sex: Treating RN: 03/17/1946 (76 y.o. Corey [name] Primary Care Grazia Taffe: Corey [name] Other Clinician: Referring Hollynn Garno: Treating Toniesha Zellner/Extender: Corey [name]: 11 Active Problems Location of Pain Severity and Description of Pain Patient Has Paino No Site Locations Pain Management and Medication Current Pain Management: Electronic Signature(s) Signed: 01/17/2022 4:25:50 PM By: Corey Hammock RN Entered By: Corey [name] on 01/17/2022 11:06:43 -------------------------------------------------------------------------------- Patient/Caregiver Education Details Patient Name: Date of Service: Corey [name] 9/5/2023andnbsp10:45 A [name] Medical Record Number: [id number] Patient Account Number: [id number] Date of Birth/Gender: Treating RN: 08-Jun-1945 (76 y.o. Corey [name] Primary Care Physician: Corey [name] Other Clinician: Referring Physician: Treating Physician/Extender: Corey [name] in [name]: 11 Education Assessment Education Provided To: Patient Education Topics Provided Wound Debridement: Handouts: Wound Debridement Methods: Explain/Verbal Responses: State content  correctly Electronic Signature(s) Signed: 01/17/2022 4:25:50 PM By: Corey Hammock RN Entered By: Corey [name] on 01/17/2022 11:18:57 -------------------------------------------------------------------------------- Wound Assessment Details Patient Name: Date of Service: Corey [name] Medical Record Number: [id number] Patient Account Number: [id number] Date of Birth/Sex: Treating RN: 03/19/1946 (76 y.o. Corey [name], Corey [name] Primary Care Morty Ortwein: Corey [name] Other Clinician: Referring Jeyden Coffelt: Treating Tryton Bodi/Extender: Corey [name]: 11 Wound Status Wound Number: 1 Primary Diabetic Wound/Ulcer of the Lower Extremity Etiology: Wound Location: Right, Lateral Lower Leg Wound Open Wounding Event: Gradually Appeared Status: Date Acquired: 08/15/2021 Comorbid Cataracts, Sleep Apnea, Congestive Heart Failure, Hypertension, [name] Of [name]: 11 History: Myocardial Infarction, Type II Diabetes, Gout Clustered Wound: No Photos Wound Measurements Length: (cm) 12.5 Width: (cm) 7 Depth: (cm) 0.2 Area: (cm) 68.722 Volume: (cm) 13.744 % Reduction in Area: -218.2% % Reduction in Volume: -536.3% Epithelialization: Medium (34-66%) Tunneling: No Undermining: No Wound Description Classification: Grade 2 Wound Margin: Distinct, outline attached Exudate Amount: Medium Exudate Type: Serosanguineous Exudate Color: red, brown Foul Odor After Cleansing: No Slough/Fibrino Yes Wound Bed Granulation Amount: Medium (34-66%) Exposed Structure Granulation Quality: Red, Pink Fascia Exposed: No Necrotic Amount: Medium (34-66%) Fat Layer (Subcutaneous Tissue) Exposed: Yes Necrotic Quality: Adherent Slough Tendon Exposed: No Muscle Exposed: No Joint Exposed: No Bone Exposed: No [name] Notes Wound #1 (Lower Leg) Wound Laterality: Right, Lateral Cleanser Soap and Water Discharge Instruction: May shower and wash wound  with dial antibacterial soap and water prior to dressing change. Wound Cleanser Discharge Instruction: Cleanse the wound with wound cleanser prior to applying a clean dressing using gauze sponges, not tissue or cotton  balls. Peri-Wound Care Topical collagen and blastx Discharge Instruction: apply over bthe topical compounding antibiotics. Primary Dressing Fibracol Plus Dressing, 4x4.38 in (collagen) Discharge Instruction: Moisten collagen with saline or hydrogel until the blastx and powder collagen arrive. Secondary Dressing ABD Pad, 8x10 Discharge Instruction: Apply over primary dressing as directed. Woven Gauze Sponge, Non-Sterile 4x4 in Discharge Instruction: Apply over primary dressing as directed. Zetuvit Plus 4x8 in Discharge Instruction: Apply over primary dressing as directed. Secured With Compression Wrap ThreePress (3 layer compression wrap) Discharge Instruction: Apply three layer compression as directed. Compression Stockings Add-Ons Electronic Signature(s) Signed: 01/17/2022 4:25:50 PM By: Corey Hammock RN Signed: 01/17/2022 6:08:07 PM By: Deon Pilling RN, BSN Entered By: Deon Pilling on 01/17/2022 11:13:15 -------------------------------------------------------------------------------- Vitals Details Patient Name: Date of Service: Corey [name] Medical Record Number: [id number] Patient Account Number: [id number] Date of Birth/Sex: Treating RN: 1945/10/17 (76 y.o. Corey [name], Corey [name] Primary Care Riata Ikeda: Corey [name] Other Clinician: Referring Angad Nabers: Treating Aryiah Monterosso/Extender: Corey [name]: 11 Vital Signs Time Taken: 11:06 Temperature (F): 97.8 Height (in): 66 Pulse (bpm): 60 Weight (lbs): 145 Respiratory Rate (breaths/min): 17 Body Mass Index (BMI): 23.4 Blood Pressure (mmHg): 162/89 Reference Range: 80 - 120 mg / dl Electronic Signature(s) Signed: 01/17/2022 4:25:50 PM By: Corey Hammock RN Entered By: Corey [name] on 01/17/2022 11:06:36

## 2022-01-17 NOTE — Progress Notes (Signed)
Corey [name] ([id number]) Visit Report for 01/17/2022 Chief Complaint Document Details Patient Name: Date of Service: Corey [name] 01/17/2022 10:45 A M Medical Record Number: [id number] Patient Account Number: [id number] Date of Birth/Sex: Treating RN: 1946-01-30 (76 y.o. M) Primary Care Provider: Billey Gosling Other Clinician: Referring Provider: Treating Provider/Extender: Mickle Asper in Treatment: 11 Information Obtained from: Patient Chief Complaint 11/01/2021; right lower extremity wound Electronic Signature(s) Signed: 01/17/2022 12:26:30 PM By: Kalman Shan DO Entered By: Kalman Shan on 01/17/2022 11:50:58 -------------------------------------------------------------------------------- Debridement Details Patient Name: Date of Service: Corey [name], [name] 01/17/2022 10:45 A M Medical Record Number: [id number] Patient Account Number: [id number] Date of Birth/Sex: Treating RN: [birth year] (76 y.o. [name], [name] Primary Care Provider: Billey Gosling Other Clinician: Referring Provider: Treating Provider/Extender: Mickle Asper in Treatment: 11 Debridement Performed for Assessment: Wound #1 Right,Lateral Lower Leg Performed By: Physician Kalman Shan, DO Debridement Type: Debridement Severity of Tissue Pre Debridement: Fat layer exposed Level of Consciousness (Pre-procedure): Awake and Alert Pre-procedure Verification/Time Out Yes - 11:15 Taken: Start Time: 11:16 Pain Control: Lidocaine 5% topical ointment T Area Debrided (L x W): otal 12.5 (cm) x 7 (cm) = 87.5 (cm) Tissue and other material debrided: Viable, Non-Viable, Slough, Subcutaneous, Skin: Dermis , Skin: Epidermis, Slough Level: Skin/Subcutaneous Tissue Debridement Description: Excisional Instrument: Curette Bleeding: Minimum Hemostasis Achieved: Pressure End Time: 11:23 Procedural Pain: 0 Post Procedural Pain: 0 Response to Treatment: Procedure was  tolerated well Level of Consciousness (Post- Awake and Alert procedure): Post Debridement Measurements of Total Wound Length: (cm) 12.5 Width: (cm) 7 Depth: (cm) 0.2 Volume: (cm) 13.744 Character of Wound/Ulcer Post Debridement: Improved Severity of Tissue Post Debridement: Fat layer exposed Post Procedure Diagnosis Same as Pre-procedure Electronic Signature(s) Signed: 01/17/2022 12:26:30 PM By: Kalman Shan DO Signed: 01/17/2022 4:25:50 PM By: Rhae Hammock RN Entered By: Rhae Hammock on 01/17/2022 11:23:34 -------------------------------------------------------------------------------- HPI Details Patient Name: Date of Service: Corey [name], [name] 01/17/2022 10:45 A M Medical Record Number: [id number] Patient Account Number: [id number] Date of Birth/Sex: Treating RN: 1946/01/08 (76 y.o. M) Primary Care Provider: Billey Gosling Other Clinician: Referring Provider: Treating Provider/Extender: Mickle Asper in Treatment: 11 History of Present Illness HPI Description: Admission 11/01/2021 [name] Is a 76 year old male with a past medical history of of type 2 diabetes, coronary artery disease, stage IV kidney disease acute on chronic combined systolic and diastolic heart failure that presents to the clinic for a 8-monthhistory of right lower extremity wound. He states he is not sure how this started. It is completely eschared. He reports his wound has been stable for the past month. He has been keeping the area covered. He has mild chronic pain. He denies signs of infection. He states he has a history of lower extremity ulcers last being on the left leg. He could not tell me how this was treated in the past. 6/27; patient presents for follow-up. He is scheduled for his lower extremity arterial studies on July 6th. He has been using Medihoney to the wound bed. He has no issues or complaints today. He denies signs of infection. 7/6; patient presents for  follow-up. He has home health that reported the wound having odor and increased pain last week. He was sent to urgent care. He was started on doxycycline. He is continuing Medihoney. He has his scheduled ABIs w/TBIs today. 7/10; patient presents for follow-up. He had his arterial studies done last week.  He had triphasic waveforms throughout the right lower extremity. His TBI was 0.64. He has good pedal pulses. He is almost done with his course of antibiotics. He has been using mupirocin ointment to the wound bed. 7/17; patient presents for follow-up. He has been using Iodosorb under compression therapy. He has no issues or complaints today. He states he is receiving [facility] antibiotic tomorrow In the mail. 7/24; patient presents for follow-up. He has been doing Dakin's wet-to-dry dressings to the wound bed daily without issues. He currently denies signs of infection. He reports completing his course of doxycycline. He has his [facility] antibiotic with him today. 7/31; patient presents for follow-up. We have been using Iodoflex with [facility] under compression therapy. He has no issues or complaints today. He denies signs of infection. 8/7; patient presents for follow-up. We have been using Iodoflex with [facility] under compression therapy. Home health came out once last week to change the wrap. He has no issues or complaints today. He denies signs of infection. 8/14; patient presents for follow-up. We have been using Key[facility] with Hydrofera Blue under compression therapy. He has tolerated this well. He has no issues or complaints today. 8/21; patient presents for follow-up. We have been using [facility] with Hydrofera Blue under compression therapy. The Hydrofera Blue was stuck on tightly to the wound bed today. Other than that he has no issues or complaints. We have not heard back for insurance approval for skin sub. 8/28; patient presents for follow-up. We ordered blast X and collagen powder at last  clinic visit. It is unclear if he has received this. We placed [facility] with Fibracol under 3 layer compression last week. Patient has home health. Patient has no issues or complaints today. He was not approved for TheraSkin. 9/5; patient presents for follow-up. Insurance would not cover blast X. We do have a sample and he would like to try this. We have been using [facility] antibiotic and collagen under 3 layer compression. He has no issues or complaints today. Electronic Signature(s) Signed: 01/17/2022 12:26:30 PM By: Kalman Shan DO Entered By: Kalman Shan on 01/17/2022 11:51:30 -------------------------------------------------------------------------------- Physical Exam Details Patient Name: Date of Service: Corey [name], [name] 01/17/2022 10:45 A M Medical Record Number: [id number] Patient Account Number: [id number] Date of Birth/Sex: Treating RN: 05-Sep-1945 (76 y.o. M) Primary Care Provider: Billey Gosling Other Clinician: Referring Provider: Treating Provider/Extender: Mickle Asper in Treatment: 11 Constitutional respirations regular, non-labored and within target range for patient.. Cardiovascular 2+ dorsalis pedis/posterior tibialis pulses. Psychiatric pleasant and cooperative. Notes Right lower extremity: T the anterior aspect there is granulation tissue and nonviable tissue present. No signs of surrounding infection. Good edema control. o Electronic Signature(s) Signed: 01/17/2022 12:26:30 PM By: Kalman Shan DO Entered By: Kalman Shan on 01/17/2022 11:51:57 -------------------------------------------------------------------------------- Physician Orders Details Patient Name: Date of Service: Corey [name], [name] 01/17/2022 10:45 A M Medical Record Number: [id number] Patient Account Number: [id number] Date of Birth/Sex: Treating RN: 1945/06/20 (76 y.o. [name], [name] Primary Care Provider: Billey Gosling Other Clinician: Referring  Provider: Treating Provider/Extender: Mickle Asper in Treatment: 19 Verbal / Phone Orders: No Diagnosis Coding ICD-10 Coding Code Description S97.026 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 with other specified severity E11.622 Type 2 diabetes mellitus with other skin ulcer I50.42 Chronic combined systolic (congestive) and diastolic (congestive) heart failure I25.119 Atherosclerotic heart disease of native coronary artery with unspecified angina pectoris I87.311 Chronic venous hypertension (idiopathic) with ulcer  of right lower extremity Follow-up Appointments ppointment in 1 week. - Dr. Heber [place] and Tammi Klipp[place], Room 8 Monday Return A ppointment in 2 weeks. - Dr. Heber [place] and Tammi Kli[place], Room 8 Monday Return A Other: - ****[facility] topical compounding antibiotics bring to appt each time.**** Bring in the blastx as well. Anesthetic (In clinic) Topical Lidocaine 5% applied to wound bed Cellular or Tissue Based Products Cellular or Tissue Based Product Type: - run insurance for [employer], epicord, and organogenesis advance tissue product- pending. Theraskin not approved. Epicord not covered. Bathing/ Shower/ Hygiene May shower with protection but do not get wound dressing(s) wet. Edema Control - Lymphedema / SCD / Other Elevate legs to the level of the heart or above for 30 minutes daily and/or when sitting, a frequency of: - 3-4 times a day throughout the day. Avoid standing for long periods of time. Home Health New wound care orders this week; continue Home Health for wound care. May utilize formulary equivalent dressing for wound treatment orders unless otherwise specified. - home health to twice a week. Compounding topical antibiotics directly to wound bed. Then Place collagen and blastx. Use plain collagen over the topical antibiotics. Other Home Health Orders/Instructions: - [facility] Wound Treatment Wound #1 - Lower Leg  Wound Laterality: Right, Lateral Cleanser: Soap and Water (Home Health) 3 x Per Week/30 Days Discharge Instructions: May shower and wash wound with dial antibacterial soap and water prior to dressing change. Cleanser: Wound Cleanser (Home Health) 3 x Per Week/30 Days Discharge Instructions: Cleanse the wound with wound cleanser prior to applying a clean dressing using gauze sponges, not tissue or cotton balls. Topical: collagen and blastx (Home Health) 3 x Per Week/30 Days Discharge Instructions: apply over bthe topical compounding antibiotics. Prim Dressing: Fibracol Plus Dressing, 4x4.38 in (collagen) (Home Health) 3 x Per Week/30 Days ary Discharge Instructions: Moisten collagen with saline or hydrogel until the blastx and powder collagen arrive. Secondary Dressing: ABD Pad, 8x10 (Home Health) 3 x Per Week/30 Days Discharge Instructions: Apply over primary dressing as directed. Secondary Dressing: Woven Gauze Sponge, Non-Sterile 4x4 in (Home Health) 3 x Per Week/30 Days Discharge Instructions: Apply over primary dressing as directed. Secondary Dressing: Zetuvit Plus 4x8 in (Home Health) 3 x Per Week/30 Days Discharge Instructions: Apply over primary dressing as directed. Compression Wrap: ThreePress (3 layer compression wrap) 3 x Per Week/30 Days Discharge Instructions: Apply three layer compression as directed. Electronic Signature(s) Signed: 01/17/2022 12:26:30 PM By: Kalman Shan DO Entered By: Kalman Shan on 01/17/2022 11:52:05 -------------------------------------------------------------------------------- Problem List Details Patient Name: Date of Service: Corey [name], [name] 01/17/2022 10:45 A M Medical Record Number: [id number] Patient Account Number: [id number] Date of Birth/Sex: Treating RN: Sep 14, 1945 (76 y.o. [name], [name] Primary Care Provider: Billey Gosling Other Clinician: Referring Provider: Treating Provider/Extender: Mickle Asper  in Treatment: 11 Active Problems ICD-10 Encounter Code Description Active Date MDM Diagnosis L97.918 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 with other 11/01/2021 No Yes specified severity E11.622 Type 2 diabetes mellitus with other skin ulcer 11/01/2021 No Yes I50.42 Chronic combined systolic (congestive) and diastolic (congestive) heart failure 11/01/2021 No Yes I25.119 Atherosclerotic heart disease of native coronary artery with unspecified angina 11/01/2021 No Yes pectoris I87.311 Chronic venous hypertension (idiopathic) with ulcer of right lower extremity 12/26/2021 No Yes Inactive Problems Resolved Problems Electronic Signature(s) Signed: 01/17/2022 12:26:30 PM By: Kalman Shan DO Entered By: Kalman Shan on 01/17/2022 11:50:44 -------------------------------------------------------------------------------- Progress Note Details Patient Name: Date of  Service: Corey [name], Tor Netters 01/17/2022 10:45 A M Medical Record Number: [id number] Patient Account Number: [id number] Date of Birth/Sex: Treating RN: November 26, 1945 (76 y.o. M) Primary Care Provider: Billey Gosling Other Clinician: Referring Provider: Treating Provider/Extender: Mickle Asper in Treatment: 11 Subjective Chief Complaint Information obtained from Patient 11/01/2021; right lower extremity wound History of Present Illness (HPI) Admission 11/01/2021 Mr. Aldrick Derrig Is a 76 year old male with a past medical history of of type 2 diabetes, coronary artery disease, stage IV kidney disease acute on chronic combined systolic and diastolic heart failure that presents to the clinic for a 20-monthhistory of right lower extremity wound. He states he is not sure how this started. It is completely eschared. He reports his wound has been stable for the past month. He has been keeping the area covered. He has mild chronic pain. He denies signs of infection. He states he has a history of lower  extremity ulcers last being on the left leg. He could not tell me how this was treated in the past. 6/27; patient presents for follow-up. He is scheduled for his lower extremity arterial studies on July 6th. He has been using Medihoney to the wound bed. He has no issues or complaints today. He denies signs of infection. 7/6; patient presents for follow-up. He has home health that reported the wound having odor and increased pain last week. He was sent to urgent care. He was started on doxycycline. He is continuing Medihoney. He has his scheduled ABIs w/TBIs today. 7/10; patient presents for follow-up. He had his arterial studies done last week. He had triphasic waveforms throughout the right lower extremity. His TBI was 0.64. He has good pedal pulses. He is almost done with his course of antibiotics. He has been using mupirocin ointment to the wound bed. 7/17; patient presents for follow-up. He has been using Iodosorb under compression therapy. He has no issues or complaints today. He states he is receiving [facility] antibiotic tomorrow In the mail. 7/24; patient presents for follow-up. He has been doing Dakin's wet-to-dry dressings to the wound bed daily without issues. He currently denies signs of infection. He reports completing his course of doxycycline. He has his [facility] antibiotic with him today. 7/31; patient presents for follow-up. [facility] He has no issues or complaints today. He denies signs of infection. 8/7; patient presents for follow-up. We have [facility]. Home health came out once last week to change the wrap. He has no issues or complaints today. He denies signs of infection. 8/14; patient presents for follow-up. We have been using [facility] with Hydrofera Blue under compression therapy. He has tolerated this well. He has no issues or complaints today. 8/21; patient presents  for follow-up. We have been using [facility] with Hydrofera Blue under compression therapy. The Hydrofera Blue was stuck on tightly to the wound bed today. Other than that he has no issues or complaints. We have not heard back for insurance approval for skin sub. 8/28; patient presents for follow-up. We ordered blast X and collagen powder at last clinic visit. It is unclear if he has received this. We placed [facility] with Fibracol under 3 layer compression last week. Patient has home health. Patient has no issues or complaints today. He was not approved for TheraSkin. 9/5; patient presents for follow-up. Insurance would not cover blast X. We do have a sample and he would like to try  this. We have been using [facility] antibiotic and collagen under 3 layer compression. He has no issues or complaints today. Patient History Family History Cancer - Father, Diabetes - Mother,Father, Heart Disease - Mother,Siblings, Hypertension - Mother,Father. Social History Former smoker - quit 1970, Marital Status - Married, Drug Use - No History, Caffeine Use - Moderate - coffee. Medical History Eyes Patient has history of Cataracts Respiratory Patient has history of Sleep Apnea - has CPAP does not wear Cardiovascular Patient has history of Congestive Heart Failure, Hypertension, Myocardial Infarction - 2014 Endocrine Patient has history of Type II Diabetes Musculoskeletal Patient has history of Gout Neurologic Denies history of Seizure Disorder Hospitalization/Surgery History - 06/2021 inguinal hernia repair. - cardioversion 09/2020. - ventral hernia repair 3/19. 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 Vitals Time Taken: 11:06 AM, Height: 66 in,  Weight: 145 lbs, BMI: 23.4, Temperature: 97.8 F, Pulse: 60 bpm, Respiratory Rate: 17 breaths/min, Blood Pressure: 162/89 mmHg. Cardiovascular 2+ dorsalis pedis/posterior tibialis pulses. Psychiatric pleasant and cooperative. General Notes: Right lower extremity: T the anterior aspect there is granulation tissue and nonviable tissue present. No signs of surrounding infection. Good o edema control. Integumentary (Hair, Skin) Wound #1 status is Open. Original cause of wound was Gradually Appeared. The date acquired was: 08/15/2021. The wound has been in treatment 11 weeks. The wound is located on the Right,Lateral Lower Leg. The wound measures 12.5cm length x 7cm width x 0.2cm depth; 68.722cm^2 area and 13.744cm^3 volume. There is Fat Layer (Subcutaneous Tissue) exposed. There is no tunneling or undermining noted. There is a medium amount of serosanguineous drainage noted. The wound margin is distinct with the outline attached to the wound base. There is medium (34-66%) red, pink granulation within the wound bed. There is a medium (34-66%) amount of necrotic tissue within the wound bed including Adherent Slough. 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 Type 2 diabetes mellitus with other skin ulcer Chronic combined systolic (congestive) and diastolic (congestive) heart failure Atherosclerotic heart disease of native coronary artery with unspecified angina pectoris Chronic venous hypertension (idiopathic) with ulcer of right lower extremity Patient's wound appears well-healing. I debrided nonviable tissue. I recommended continuing the course with [facility] antibiotic and collagen. We have a free sample of blast X that we gave to the patient today. We will also put this under the wrap. He has home health and they can continue using this as well. Follow-up in 1 week. Procedures Wound #1 Pre-procedure  diagnosis of Wound #1 is a Diabetic Wound/Ulcer of the Lower Extremity located on the Right,Lateral Lower Leg .Severity of Tissue Pre Debridement is: Fat layer exposed. There was a Excisional Skin/Subcutaneous Tissue Debridement with a total area of 87.5 sq cm performed by Kalman Shan, DO. With the following instrument(s): Curette to remove Viable and Non-Viable tissue/material. Material removed includes Subcutaneous Tissue, Slough, Skin: Dermis, and Skin: Epidermis after achieving pain control using Lidocaine 5% topical ointment. A time out was conducted at 11:15, prior to the start of the procedure. A Minimum amount of bleeding was controlled with Pressure. The procedure was tolerated well with a pain level of 0 throughout and a pain level of 0 following the procedure. Post Debridement Measurements: 12.5cm length x 7cm width x 0.2cm depth; 13.744cm^3 volume. Character of Wound/Ulcer Post  Debridement is improved. Severity of Tissue Post Debridement is: Fat layer exposed. Post procedure Diagnosis Wound #1: Same as Pre-Procedure Pre-procedure diagnosis of Wound #1 is a Diabetic Wound/Ulcer of the Lower Extremity located on the Right,Lateral Lower Leg . There was a Three Layer Compression Therapy Procedure by Rhae Hammock, RN. Post procedure Diagnosis Wound #1: Same as Pre-Procedure Plan Follow-up Appointments: Return Appointment in 1 week. - Dr. Heber [place] and [place], Room 8 Monday Return Appointment in 2 weeks. - Dr. Heber  and Tammi Klippel, Room 8 Monday Other: - ****[facility] topical compounding antibiotics bring to appt each time.**** Bring in the blastx as well. Anesthetic: (In clinic) Topical Lidocaine 5% applied to wound bed Cellular or Tissue Based Products: Cellular or Tissue Based Product Type: - run insurance for [employer], epicord, and organogenesis advance tissue product- pending. Theraskin not approved. Epicord not covered. Bathing/ Shower/ Hygiene: May shower with  protection but do not get wound dressing(s) wet. Edema Control - Lymphedema / SCD / Other: Elevate legs to the level of the heart or above for 30 minutes daily and/or when sitting, a frequency of: - 3-4 times a day throughout the day. Avoid standing for long periods of time. Home Health: New wound care orders this week; continue Home Health for wound care. May utilize formulary equivalent dressing for wound treatment orders unless otherwise specified. - home health to twice a week. Compounding topical antibiotics directly to wound bed. Then Place collagen and blastx. Use plain collagen over the topical antibiotics. Other Home Health Orders/Instructions: - [facility] WOUND #1: - Lower Leg Wound Laterality: Right, Lateral Cleanser: Soap and Water (Home Health) 3 x Per Week/30 Days Discharge Instructions: May shower and wash wound with dial antibacterial soap and water prior to dressing change. Cleanser: Wound Cleanser (Home Health) 3 x Per Week/30 Days Discharge Instructions: Cleanse the wound with wound cleanser prior to applying a clean dressing using gauze sponges, not tissue or cotton balls. Topical: collagen and blastx (Home Health) 3 x Per Week/30 Days Discharge Instructions: apply over bthe topical compounding antibiotics. Prim Dressing: Fibracol Plus Dressing, 4x4.38 in (collagen) (Home Health) 3 x Per Week/30 Days ary Discharge Instructions: Moisten collagen with saline or hydrogel until the blastx and powder collagen arrive. Secondary Dressing: ABD Pad, 8x10 (Home Health) 3 x Per Week/30 Days Discharge Instructions: Apply over primary dressing as directed. Secondary Dressing: Woven Gauze Sponge, Non-Sterile 4x4 in (Home Health) 3 x Per Week/30 Days Discharge Instructions: Apply over primary dressing as directed. Secondary Dressing: Zetuvit Plus 4x8 in (Home Health) 3 x Per Week/30 Days Discharge Instructions: Apply over primary dressing as directed. Com pression Wrap:  ThreePress (3 layer compression wrap) 3 x Per Week/30 Days Discharge Instructions: Apply three layer compression as directed. 1. In office sharp debridement 2. Blast X, collagen and [facility] topical antibiotic 3. Follow-up in 1 week Electronic Signature(s) Signed: 01/17/2022 12:26:30 PM By: Kalman Shan DO Entered By: Kalman Shan on 01/17/2022 11:53:10 -------------------------------------------------------------------------------- HxROS Details Patient Name: Date of Service: Corey [name], [name] 01/17/2022 10:45 A M Medical Record Number: [id number] Patient Account Number: [id number] Date of Birth/Sex: Treating RN: [birth year] (76 y.o. M) Primary Care Provider: Billey Gosling Other Clinician: Referring Provider: Treating Provider/Extender: [name] in Treatment: 11 Constitutional Symptoms (General Health) Medical History: Past Medical History Notes: CVA 2014 bilateral hearing loss Eyes Medical History: Positive for: Cataracts Respiratory Medical History: Positive for: Sleep Apnea - has CPAP does not wear Cardiovascular Medical History: Positive for: Congestive Heart Failure;  Hypertension; Myocardial Infarction - 2014 Gastrointestinal Medical History: Past Medical History Notes: Diverticulosis Endocrine Medical History: Positive for: Type II Diabetes Time with diabetes: >20 years Treated with: Oral agents, Diet Blood sugar tested every day: Yes Tested : BID Genitourinary Medical History: Past Medical History Notes: Renal insufficiency 20/ Musculoskeletal Medical History: Positive for: Gout Neurologic Medical History: Negative for: Seizure Disorder Oncologic Medical History: Past Medical History Notes: Prostate Ca HBO Extended History Items Eyes: Cataracts Immunizations Pneumococcal Vaccine: Received Pneumococcal Vaccination: No Implantable Devices No devices added Hospitalization / Surgery History Type of  Hospitalization/Surgery 06/2021 inguinal hernia repair cardioversion 09/2020 ventral hernia repair 3/19 Family and Social History Cancer: Yes - Father; Diabetes: Yes - Mother,Father; Heart Disease: Yes - Mother,Siblings; Hypertension: Yes - Mother,Father; Former smoker - quit 1970; Marital Status - Married; Drug Use: No History; Caffeine Use: Moderate - coffee; Financial Concerns: No; Food, Clothing or Shelter Needs: No; Support System Lacking: No; Transportation Concerns: No Electronic Signature(s) Signed: 01/17/2022 12:26:30 PM By: Kalman Shan DO Entered By: Kalman Shan on 01/17/2022 11:51:36 -------------------------------------------------------------------------------- SuperBill Details Patient Name: Date of Service: Corey [name], [name] 01/17/2022 Medical Record Number: [id number] Patient Account Number: [id number] Date of Birth/Sex: Treating RN: [birth year] (76 y.o. 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 with other specified severity E11.622 Type 2 diabetes mellitus with other skin ulcer I50.42 Chronic combined systolic (congestive) and diastolic (congestive) heart failure I25.119 Atherosclerotic heart disease of native coronary artery with unspecified angina pectoris I87.311 Chronic venous hypertension (idiopathic) with ulcer of right lower extremity Facility Procedures CPT4 Code: [id number] Description: 08676 - DEB SUBQ TISSUE 20 SQ CM/< ICD-10 Diagnosis Description L97.918 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 with other spe Modifier: cified severity Quantity: 1 CPT4 Code: [id number] Description: 71245 - DEB SUBQ TISS EA ADDL 20CM ICD-10 Diagnosis Description L97.918 Non-pressure chronic ulcer  of unspecified part of right lower leg with other spe Modifier: cified severity Quantity: 4 Physician Procedures : CPT4 Code Description Modifier [id number] 11042 - WC PHYS SUBQ TISS 20 SQ CM ICD-10 Diagnosis Description L97.918 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

## 2022-01-18 ENCOUNTER — Telehealth: Payer: Self-pay | Admitting: Internal Medicine

## 2022-01-18 ENCOUNTER — Encounter: Payer: Self-pay | Admitting: Vascular Surgery

## 2022-01-18 ENCOUNTER — Ambulatory Visit: Payer: Medicare HMO | Admitting: Vascular Surgery

## 2022-01-18 ENCOUNTER — Other Ambulatory Visit: Payer: Self-pay

## 2022-01-18 VITALS — BP 164/96 | HR 53 | Temp 98.0°F | Resp 20 | Ht 66.0 in | Wt 162.0 lb

## 2022-01-18 DIAGNOSIS — N184 Chronic kidney disease, stage 4 (severe): Secondary | ICD-10-CM | POA: Diagnosis not present

## 2022-01-18 DIAGNOSIS — N185 Chronic kidney disease, stage 5: Secondary | ICD-10-CM

## 2022-01-18 NOTE — Telephone Encounter (Signed)
Corey [name] from [place] called to report his heart rate was at 51. The parameters for his heart-rate are set to be between 60-100 but  Corey [name] reports his heart-rate is always lower, usually about 51, and since he isn't experiencing any other symptoms would like to change the parameters to reflect this. Pt has already talked to his cardiologist about this and they are in agreement.  For any questions please call:  Corey [name]: [phone]

## 2022-01-18 NOTE — Progress Notes (Signed)
Patient ID: Corey [name], male   DOB: 24-Mar-1946, 76 y.o.   MRN: [id number]  Reason for Consult: New Patient (Initial Visit)   Referred by Claudia Desanctis, MD  Subjective:     HPI:  Corey [name] is a 76 y.o. male history of acute kidney injury without previous dialysis.  No family history of.  He is right-hand dominant arthritis.  Denies any previous left upper extremity procedures.  He is here today to discuss dialysis access.  Past Medical History:  Diagnosis Date   Cataract    CHF (congestive heart failure) ([place])    Chronic heart failure with preserved ejection fraction (HFpEF) ([place])    Colon polyp 2003   Dr [name]; F/U was to be 2008( not completed)   CVA (cerebral infarction) 2014   Diabetes mellitus    Diverticulosis 2003   Dyspnea    with exertion   Gout    Hypertension    Myocardial infarction ([place]) 2014   ?? PEA cardiac arrest   OSA (obstructive sleep apnea) 03/27/2018   Pneumonia 09/29/2011   Avelox X 10 days as OP   Prostate cancer ([place])    Renal insufficiency    Seizures ([facility])    not treated for seizure disorder; had a seizure after stroke 2014; no seizure since then   Family History  Problem Relation Age of Onset   Heart failure Mother    Hypertension Mother    Diabetes Mother    Hypertension Father    Prostate cancer Father    Diabetes Father    Cancer Father    Heart failure Brother    Stroke Neg Hx    Heart disease Neg Hx    Colon cancer Neg Hx    Past Surgical History:  Procedure Laterality Date   COLONOSCOPY W/ POLYPECTOMY  2003   no F/U ([place] discussed 12/03/12)   INGUINAL HERNIA REPAIR Left 06/16/2021   Procedure: LAPAROSCOPIC, POSSIBLY OPEN LEFT INGUINAL HERNIA REPAIR WITH MESH;  Surgeon: Felicie Morn, MD;  Location: [facility];  Service: General;  Laterality: Left;   INSERTION OF MESH N/A 07/16/2017   Procedure: INSERTION OF MESH;  Surgeon: [name], MD;  Location: [facility];  Service: General;  Laterality: N/A;   PEG PLACEMENT   2014   PEG TUBE REMOVAL  2014   PROSTATE BIOPSY     RIGHT/LEFT HEART CATH AND CORONARY ANGIOGRAPHY N/A 09/20/2020   Procedure: RIGHT/LEFT HEART CATH AND CORONARY ANGIOGRAPHY;  Surgeon: Leonie Man, MD;  Location: [place] [facility];  Service: Cardiovascular;  Laterality: N/A;   TEE WITHOUT CARDIOVERSION N/A 09/27/2020   Procedure: TRANSESOPHAGEAL ECHOCARDIOGRAM (TEE);  Surgeon: Wonda Olds, MD;  Location: [facility] OR;  Service: Thoracic;  Laterality: N/A;   TRACHEOSTOMY  2014   was closed after hospital stay   [place] N/A 07/16/2017   Procedure: [place];  Surgeon: [name], MD;  Location: [place];  Service: General;  Laterality: N/A;   wrist aspiration  02/16/12    monosodium urate crystals; Dr Elease Hashimoto Social History:  Social History   Tobacco Use   Smoking status: Former    Packs/day: 0.25    Years: 1.00    Total pack years: 0.25    Types: Cigarettes    Quit date: 05/15/1968    Years since quitting: 53.7   Smokeless tobacco: Never   Tobacco comments:    smoked [place],  up to 1-2 cigarettes/ day  Substance Use Topics   Alcohol use: No    Alcohol/week: 0.0 standard drinks of alcohol    Allergies  Allergen Reactions   Hydrochlorothiazide Other (See Comments)    Gout , uncontrolled diabetes and renal insufficiency    Current Outpatient Medications  Medication Sig Dispense Refill   ACCU-CHEK AVIVA PLUS test strip TEST BLOOD SUGAR TWICE DAILY AS DIRECTED 200 strip 1   ACCU-CHEK AVIVA PLUS test strip Use as instructed 50 each 12   albuterol (VENTOLIN HFA) 108 (90 Base) MCG/ACT inhaler Inhale 2 puffs into the lungs every 4 (four) hours as needed for wheezing or shortness of breath. 18 g 1   allopurinol (ZYLOPRIM) 100 MG tablet TAKE 1/2 TABLET ONE TIME DAILY 45 tablet 3   apixaban (ELIQUIS) 5 MG TABS tablet Take 1 tablet (5 mg total) by mouth 2 (two) times daily. 60 tablet 6   bismuth subsalicylate (PEPTO  BISMOL) 262 MG/15ML suspension Take 30 mLs by mouth every 6 (six) hours as needed for indigestion.     colchicine (COLCRYS) 0.6 MG tablet Take 2 tabs once and then one tab one hour later as needed for gout 15 tablet 2   glipiZIDE (GLUCOTROL XL) 2.5 MG 24 hr tablet Take 1 tablet (2.5 mg total) by mouth daily with breakfast. 90 tablet 1   hydrALAZINE (APRESOLINE) 50 MG tablet Take 2 tablets (100 mg total) by mouth 3 (three) times daily. 180 tablet 11   JANUVIA 25 MG tablet Take 1 tablet by mouth once daily 30 tablet 0   rosuvastatin (CRESTOR) 40 MG tablet Take 1 tablet (40 mg total) by mouth daily. 90 tablet 2   Tafamidis (VYNDAMAX) 61 MG CAPS Take 61 mg by mouth daily. 30 capsule 12   torsemide (DEMADEX) 20 MG tablet Take 1 tablet (20 mg total) by mouth daily. 90 tablet 1   TRUEplus Lancets 33G MISC USE 4 (FOUR) TIMES DAILY 400 each 2   nitroGLYCERIN (NITROSTAT) 0.4 MG SL tablet Place 0.4 mg under the tongue every 5 (five) minutes as needed for chest pain.     No current facility-administered medications for this visit.    Review of Systems  Constitutional:  Constitutional negative. HENT: HENT negative.  Eyes: Eyes negative.  Respiratory: Respiratory negative.  Cardiovascular: Cardiovascular negative.  GI: Gastrointestinal negative.  Musculoskeletal: Musculoskeletal negative.  Neurological: Neurological negative. Hematologic: Hematologic/lymphatic negative.  Psychiatric: Psychiatric negative.        Objective:  Objective   Vitals:   01/18/22 0924  BP: (!) 164/96  Pulse: (!) 53  Resp: 20  Temp: 98 F (36.7 C)  SpO2: 98%  Weight: 162 lb (73.5 kg)  Height: '5\' 6"'$  (1.676 m)   Body mass index is 26.15 kg/m.  Physical Exam HENT:     Head: Normocephalic.     Nose: Nose normal.  Eyes:     Pupils: Pupils are equal, round, and reactive to light.  Cardiovascular:     Pulses:          Radial pulses are 2+ on the right side and 0 on the left side.     Comments: 2+ palpable left  brachial pulse Abdominal:     General: Abdomen is flat.     Palpations: Abdomen is soft.  Musculoskeletal:     Comments: Appears to be a soft tissue tumor  Neurological:     Mental Status: He is alert.     Comments: Left hand weakness  Data: Right Pre-Dialysis Findings:  +-----------------------+----------+--------------------+---------+--------  +  Location               PSV (cm/s)Intralum. Diam. (cm)Waveform  Comments  +-----------------------+----------+--------------------+---------+--------  +  Brachial Antecub. fossa76        0.52                triphasic            +-----------------------+----------+--------------------+---------+--------  +  Radial Art at Wrist    81        0.23                triphasic            +-----------------------+----------+--------------------+---------+--------  +  Ulnar Art at Wrist     57        0.36                triphasic            +-----------------------+----------+--------------------+---------+--------  +   Left Pre-Dialysis Findings:  +-----------------------+----------+--------------------+---------+--------  +  Location               PSV (cm/s)Intralum. Diam. (cm)Waveform  Comments  +-----------------------+----------+--------------------+---------+--------  +  Brachial Antecub. fossa65        0.41                triphasic            +-----------------------+----------+--------------------+---------+--------  +  Radial Art at Wrist    67        0.19                triphasic            +-----------------------+----------+--------------------+---------+--------  +  Ulnar Art at Wrist     54        0.21                triphasic            +-----------------------+----------+--------------------+---------+--------  Right Cephalic   Diameter (cm)Depth (cm) Findings   +-----------------+-------------+----------+----------+  Prox upper arm       0.26        0.25                +-----------------+-------------+----------+----------+  Mid upper arm        0.26        0.17   lymph node  +-----------------+-------------+----------+----------+  Dist upper arm       0.26        0.11   branching   +-----------------+-------------+----------+----------+  Antecubital fossa    0.28        0.17               +-----------------+-------------+----------+----------+  Prox forearm         0.25        0.19               +-----------------+-------------+----------+----------+  Mid forearm          0.36        0.20   branching   +-----------------+-------------+----------+----------+  Dist forearm         0.11        0.09   branching   +-----------------+-------------+----------+----------+   +-----------------+-------------+----------+--------------+  Right Basilic    Diameter (cm)Depth (cm)   Findings     +-----------------+-------------+----------+--------------+  Prox upper arm       0.38        0.61  branching     +-----------------+-------------+----------+--------------+  Mid upper arm        0.21        0.54     branching     +-----------------+-------------+----------+--------------+  Dist upper arm       0.24        0.37     Lymph node    +-----------------+-------------+----------+--------------+  Antecubital fossa    0.17        0.12     branching     +-----------------+-------------+----------+--------------+  Prox forearm         0.14        0.13                   +-----------------+-------------+----------+--------------+  Mid forearm          0.17        0.15                   +-----------------+-------------+----------+--------------+  Distal forearm       0.06        0.11                   +-----------------+-------------+----------+--------------+  Wrist                                   not visualized  +-----------------+-------------+----------+--------------+    +-----------------+-------------+----------+------------------------+  Left Cephalic    Diameter (cm)Depth (cm)        Findings          +-----------------+-------------+----------+------------------------+  Prox upper arm       0.11        0.60                             +-----------------+-------------+----------+------------------------+  Mid upper arm        0.11                                         +-----------------+-------------+----------+------------------------+  Dist upper arm       0.12        0.13                             +-----------------+-------------+----------+------------------------+  Antecubital fossa    0.22        1.80                             +-----------------+-------------+----------+------------------------+  Prox forearm                            branching and very small  +-----------------+-------------+----------+------------------------+   +-----------------+-------------+----------+--------------+  Left Basilic     Diameter (cm)Depth (cm)   Findings     +-----------------+-------------+----------+--------------+  Prox upper arm       0.15        0.46     branching     +-----------------+-------------+----------+--------------+  Mid upper arm        0.26        0.48                   +-----------------+-------------+----------+--------------+  Dist upper arm  0.13        0.44                   +-----------------+-------------+----------+--------------+  Antecubital fossa    0.26        1.20                   +-----------------+-------------+----------+--------------+  Prox forearm         0.18        1.70     branching     +-----------------+-------------+----------+--------------+  Mid forearm                             not visualized  +-----------------+-------------+----------+--------------+  Distal forearm                          not visualized   +-----------------+-------------+----------+--------------+  Wrist                                   not visualized  +-----------------+-------------+----------+--------------+      Assessment/Plan:     76 year old male with kidney disease stage V permanent dialysis access without need for temporary access.  Plan will be for left arm AV fistula he does appear to have suitable basilic vein both by duplex and on physical exam.  Possibly has cephalic vein that is suitable at least at the antecubital level.  We discussed the risk benefits and alternatives of access and they demonstrate good understanding we will schedule in the near future.     Waynetta Sandy MD [facility]

## 2022-01-18 NOTE — H&P (View-Only) (Signed)
Patient ID: Corey [name], male   DOB: Apr 27, 1946, 76 y.o.   MRN: [id number]  Reason for Consult: New Patient (Initial Visit)   Referred by Claudia Desanctis, MD  Subjective:     HPI:  Corey [name] is a 76 y.o. male history of acute kidney injury without previous dialysis.  No family history of.  He is right-hand dominant arthritis.  Denies any previous left upper extremity procedures.  He is here today to discuss dialysis access.  Past Medical History:  Diagnosis Date   Cataract    CHF (congestive heart failure) ([place])    Chronic heart failure with preserved ejection fraction (HFpEF) ([place])    Colon polyp 2003   Dr [name]; F/U was to be 2008( not completed)   CVA (cerebral infarction) 2014   Diabetes mellitus    Diverticulosis 2003   Dyspnea    with exertion   Gout    Hypertension    Myocardial infarction ([place]) 2014   ?? PEA cardiac arrest   OSA (obstructive sleep apnea) 03/27/2018   Pneumonia 09/29/2011   Avelox X 10 days as OP   Prostate cancer ([place])    Renal insufficiency    Seizures ([facility])    not treated for seizure disorder; had a seizure after stroke 2014; no seizure since then   Family History  Problem Relation Age of Onset   Heart failure Mother    Hypertension Mother    Diabetes Mother    Hypertension Father    Prostate cancer Father    Diabetes Father    Cancer Father    Heart failure Brother    Stroke Neg Hx    Heart disease Neg Hx    Colon cancer Neg Hx    Past Surgical History:  Procedure Laterality Date   COLONOSCOPY W/ POLYPECTOMY  2003   no F/U ([facility] discussed 12/03/12)   INGUINAL HERNIA REPAIR Left 06/16/2021   Procedure: LAPAROSCOPIC, POSSIBLY OPEN LEFT INGUINAL HERNIA REPAIR WITH MESH;  Surgeon: Felicie Morn, MD;  Location: [facility];  Service: General;  Laterality: Left;   INSERTION OF MESH N/A 07/16/2017   Procedure: INSERTION OF MESH;  Surgeon: [name], MD;  Location: [facility];  Service: General;  Laterality: N/A;   PEG PLACEMENT   2014   PEG TUBE REMOVAL  2014   PROSTATE BIOPSY     RIGHT/LEFT HEART CATH AND CORONARY ANGIOGRAPHY N/A 09/20/2020   Procedure: RIGHT/LEFT HEART CATH AND CORONARY ANGIOGRAPHY;  Surgeon: Leonie Man, MD;  Location: [facility];  Service: Cardiovascular;  Laterality: N/A;   TEE WITHOUT CARDIOVERSION N/A 09/27/2020   Procedure: TRANSESOPHAGEAL ECHOCARDIOGRAM (TEE);  Surgeon: Wonda Olds, MD;  Location: [facility];  Service: Thoracic;  Laterality: N/A;   TRACHEOSTOMY  2014   was closed after hospital stay   [place] N/A 07/16/2017   Procedure: [place];  Surgeon: [name], MD;  Location: [facility];  Service: General;  Laterality: N/A;   wrist aspiration  02/16/12    monosodium urate crystals; Dr Elease Hashimoto Social History:  Social History   Tobacco Use   Smoking status: Former    Packs/day: 0.25    Years: 1.00    Total pack years: 0.25    Types: Cigarettes    Quit date: 05/15/1968    Years since quitting: 53.7   Smokeless tobacco: Never   Tobacco comments:    smoked [place],  up to 1-2 cigarettes/ day  Substance Use Topics   Alcohol use: No    Alcohol/week: 0.0 standard drinks of alcohol    Allergies  Allergen Reactions   Hydrochlorothiazide Other (See Comments)    Gout , uncontrolled diabetes and renal insufficiency    Current Outpatient Medications  Medication Sig Dispense Refill   ACCU-CHEK AVIVA PLUS test strip TEST BLOOD SUGAR TWICE DAILY AS DIRECTED 200 strip 1   ACCU-CHEK AVIVA PLUS test strip Use as instructed 50 each 12   albuterol (VENTOLIN HFA) 108 (90 Base) MCG/ACT inhaler Inhale 2 puffs into the lungs every 4 (four) hours as needed for wheezing or shortness of breath. 18 g 1   allopurinol (ZYLOPRIM) 100 MG tablet TAKE 1/2 TABLET ONE TIME DAILY 45 tablet 3   apixaban (ELIQUIS) 5 MG TABS tablet Take 1 tablet (5 mg total) by mouth 2 (two) times daily. 60 tablet 6   bismuth subsalicylate (PEPTO  BISMOL) 262 MG/15ML suspension Take 30 mLs by mouth every 6 (six) hours as needed for indigestion.     colchicine (COLCRYS) 0.6 MG tablet Take 2 tabs once and then one tab one hour later as needed for gout 15 tablet 2   glipiZIDE (GLUCOTROL XL) 2.5 MG 24 hr tablet Take 1 tablet (2.5 mg total) by mouth daily with breakfast. 90 tablet 1   hydrALAZINE (APRESOLINE) 50 MG tablet Take 2 tablets (100 mg total) by mouth 3 (three) times daily. 180 tablet 11   JANUVIA 25 MG tablet Take 1 tablet by mouth once daily 30 tablet 0   rosuvastatin (CRESTOR) 40 MG tablet Take 1 tablet (40 mg total) by mouth daily. 90 tablet 2   Tafamidis (VYNDAMAX) 61 MG CAPS Take 61 mg by mouth daily. 30 capsule 12   torsemide (DEMADEX) 20 MG tablet Take 1 tablet (20 mg total) by mouth daily. 90 tablet 1   TRUEplus Lancets 33G MISC USE 4 (FOUR) TIMES DAILY 400 each 2   nitroGLYCERIN (NITROSTAT) 0.4 MG SL tablet Place 0.4 mg under the tongue every 5 (five) minutes as needed for chest pain.     No current facility-administered medications for this visit.    Review of Systems  Constitutional:  Constitutional negative. HENT: HENT negative.  Eyes: Eyes negative.  Respiratory: Respiratory negative.  Cardiovascular: Cardiovascular negative.  GI: Gastrointestinal negative.  Musculoskeletal: Musculoskeletal negative.  Neurological: Neurological negative. Hematologic: Hematologic/lymphatic negative.  Psychiatric: Psychiatric negative.        Objective:  Objective   Vitals:   01/18/22 0924  BP: (!) 164/96  Pulse: (!) 53  Resp: 20  Temp: 98 F (36.7 C)  SpO2: 98%  Weight: 162 lb (73.5 kg)  Height: '5\' 6"'$  (1.676 m)   Body mass index is 26.15 kg/m.  Physical Exam HENT:     Head: Normocephalic.     Nose: Nose normal.  Eyes:     Pupils: Pupils are equal, round, and reactive to light.  Cardiovascular:     Pulses:          Radial pulses are 2+ on the right side and 0 on the left side.     Comments: 2+ palpable left  brachial pulse Abdominal:     General: Abdomen is flat.     Palpations: Abdomen is soft.  Musculoskeletal:     Comments: Appears to be a soft tissue tumor  Neurological:     Mental Status: He is alert.     Comments: Left hand weakness  Data: Right Pre-Dialysis Findings:  +-----------------------+----------+--------------------+---------+--------  +  Location               PSV (cm/s)Intralum. Diam. (cm)Waveform  Comments  +-----------------------+----------+--------------------+---------+--------  +  Brachial Antecub. fossa76        0.52                triphasic            +-----------------------+----------+--------------------+---------+--------  +  Radial Art at Wrist    81        0.23                triphasic            +-----------------------+----------+--------------------+---------+--------  +  Ulnar Art at Wrist     57        0.36                triphasic            +-----------------------+----------+--------------------+---------+--------  +   Left Pre-Dialysis Findings:  +-----------------------+----------+--------------------+---------+--------  +  Location               PSV (cm/s)Intralum. Diam. (cm)Waveform  Comments  +-----------------------+----------+--------------------+---------+--------  +  Brachial Antecub. fossa65        0.41                triphasic            +-----------------------+----------+--------------------+---------+--------  +  Radial Art at Wrist    67        0.19                triphasic            +-----------------------+----------+--------------------+---------+--------  +  Ulnar Art at Wrist     54        0.21                triphasic            +-----------------------+----------+--------------------+---------+--------  Right Cephalic   Diameter (cm)Depth (cm) Findings   +-----------------+-------------+----------+----------+  Prox upper arm       0.26        0.25                +-----------------+-------------+----------+----------+  Mid upper arm        0.26        0.17   lymph node  +-----------------+-------------+----------+----------+  Dist upper arm       0.26        0.11   branching   +-----------------+-------------+----------+----------+  Antecubital fossa    0.28        0.17               +-----------------+-------------+----------+----------+  Prox forearm         0.25        0.19               +-----------------+-------------+----------+----------+  Mid forearm          0.36        0.20   branching   +-----------------+-------------+----------+----------+  Dist forearm         0.11        0.09   branching   +-----------------+-------------+----------+----------+   +-----------------+-------------+----------+--------------+  Right Basilic    Diameter (cm)Depth (cm)   Findings     +-----------------+-------------+----------+--------------+  Prox upper arm       0.38        0.61  branching     +-----------------+-------------+----------+--------------+  Mid upper arm        0.21        0.54     branching     +-----------------+-------------+----------+--------------+  Dist upper arm       0.24        0.37     Lymph node    +-----------------+-------------+----------+--------------+  Antecubital fossa    0.17        0.12     branching     +-----------------+-------------+----------+--------------+  Prox forearm         0.14        0.13                   +-----------------+-------------+----------+--------------+  Mid forearm          0.17        0.15                   +-----------------+-------------+----------+--------------+  Distal forearm       0.06        0.11                   +-----------------+-------------+----------+--------------+  Wrist                                   not visualized  +-----------------+-------------+----------+--------------+    +-----------------+-------------+----------+------------------------+  Left Cephalic    Diameter (cm)Depth (cm)        Findings          +-----------------+-------------+----------+------------------------+  Prox upper arm       0.11        0.60                             +-----------------+-------------+----------+------------------------+  Mid upper arm        0.11                                         +-----------------+-------------+----------+------------------------+  Dist upper arm       0.12        0.13                             +-----------------+-------------+----------+------------------------+  Antecubital fossa    0.22        1.80                             +-----------------+-------------+----------+------------------------+  Prox forearm                            branching and very small  +-----------------+-------------+----------+------------------------+   +-----------------+-------------+----------+--------------+  Left Basilic     Diameter (cm)Depth (cm)   Findings     +-----------------+-------------+----------+--------------+  Prox upper arm       0.15        0.46     branching     +-----------------+-------------+----------+--------------+  Mid upper arm        0.26        0.48                   +-----------------+-------------+----------+--------------+  Dist upper arm  0.13        0.44                   +-----------------+-------------+----------+--------------+  Antecubital fossa    0.26        1.20                   +-----------------+-------------+----------+--------------+  Prox forearm         0.18        1.70     branching     +-----------------+-------------+----------+--------------+  Mid forearm                             not visualized  +-----------------+-------------+----------+--------------+  Distal forearm                          not visualized   +-----------------+-------------+----------+--------------+  Wrist                                   not visualized  +-----------------+-------------+----------+--------------+      Assessment/Plan:     76 year old male with kidney disease stage V permanent dialysis access without need for temporary access.  Plan will be for left arm AV fistula he does appear to have suitable basilic vein both by duplex and on physical exam.  Possibly has cephalic vein that is suitable at least at the antecubital level.  We discussed the risk benefits and alternatives of access and they demonstrate good understanding we will schedule in the near future.     Waynetta Sandy MD [facility]

## 2022-01-20 ENCOUNTER — Ambulatory Visit: Payer: Medicare HMO | Admitting: Endocrinology

## 2022-01-23 ENCOUNTER — Encounter (HOSPITAL_BASED_OUTPATIENT_CLINIC_OR_DEPARTMENT_OTHER): Payer: Medicare HMO | Admitting: Internal Medicine

## 2022-01-30 ENCOUNTER — Encounter (HOSPITAL_BASED_OUTPATIENT_CLINIC_OR_DEPARTMENT_OTHER): Payer: Medicare HMO | Admitting: Internal Medicine

## 2022-01-30 ENCOUNTER — Encounter (HOSPITAL_COMMUNITY): Payer: Self-pay | Admitting: Vascular Surgery

## 2022-01-30 ENCOUNTER — Other Ambulatory Visit: Payer: Self-pay

## 2022-01-30 DIAGNOSIS — R6889 Other general symptoms and signs: Secondary | ICD-10-CM | POA: Diagnosis not present

## 2022-01-30 DIAGNOSIS — E1122 Type 2 diabetes mellitus with diabetic chronic kidney disease: Secondary | ICD-10-CM | POA: Diagnosis not present

## 2022-01-30 DIAGNOSIS — L97918 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 with other specified severity: Secondary | ICD-10-CM

## 2022-01-30 DIAGNOSIS — I87311 Chronic venous hypertension (idiopathic) with ulcer of right lower extremity: Secondary | ICD-10-CM | POA: Diagnosis not present

## 2022-01-30 DIAGNOSIS — E11621 Type 2 diabetes mellitus with foot ulcer: Secondary | ICD-10-CM | POA: Diagnosis not present

## 2022-01-30 DIAGNOSIS — I25119 Atherosclerotic heart disease of native coronary artery with unspecified angina pectoris: Secondary | ICD-10-CM | POA: Diagnosis not present

## 2022-01-30 DIAGNOSIS — Z87891 Personal history of nicotine dependence: Secondary | ICD-10-CM | POA: Diagnosis not present

## 2022-01-30 DIAGNOSIS — E11622 Type 2 diabetes mellitus with other skin ulcer: Secondary | ICD-10-CM | POA: Diagnosis not present

## 2022-01-30 DIAGNOSIS — I130 Hypertensive heart and chronic kidney disease with heart failure and stage 1 through stage 4 chronic kidney disease, or unspecified chronic kidney disease: Secondary | ICD-10-CM | POA: Diagnosis not present

## 2022-01-30 DIAGNOSIS — I5042 Chronic combined systolic (congestive) and diastolic (congestive) heart failure: Secondary | ICD-10-CM | POA: Diagnosis not present

## 2022-01-30 NOTE — Progress Notes (Signed)
Corey Corey [name] ([id number]) Visit Report for 01/30/2022 Chief Complaint Document Details Patient Name: Date of Service: Corey Corey [name]. 01/30/2022 11:15 A Corey [name] Medical Record Number: [id number] Patient Account Number: [id number] Date of Birth/Sex: Treating RN: [birth year] (76 y.o. Corey [name]) Primary Care Provider: Billey Gosling Other Clinician: Referring Provider: Treating Provider/Extender: Mickle Asper in Treatment: 12 Information Obtained from: Patient Chief Complaint 11/01/2021; right lower extremity wound Electronic Signature(s) Signed: 01/30/2022 11:48:24 AM By: Kalman Shan DO Entered By: Kalman Shan on 01/30/2022 11:45:11 -------------------------------------------------------------------------------- Debridement Details Patient Name: Date of Service: Corey Corey [name], Corey Corey [name]. 01/30/2022 11:15 A Corey [name] Medical Record Number: [id number] Patient Account Number: [id number] Date of Birth/Sex: Treating RN: Jan 29, 1946 (76 y.o. Corey Corey [name] Primary Care Provider: Billey Gosling Other Clinician: Referring Provider: Treating Provider/Extender: Mickle Asper in Treatment: 12 Debridement Performed for Assessment: Wound #1 Right,Lateral Lower Leg Performed By: Physician Kalman Shan, DO Debridement Type: Debridement Severity of Tissue Pre Debridement: Fat layer exposed Level of Consciousness (Pre-procedure): Awake and Alert Pre-procedure Verification/Time Out Yes - 11:10 Taken: Start Time: 11:11 Pain Control: Lidocaine 5% topical ointment T Area Debrided (L x W): otal 10 (cm) x 6.8 (cm) = 68 (cm) Tissue and other material debrided: Viable, Non-Viable, Slough, Subcutaneous, Slough Level: Skin/Subcutaneous Tissue Debridement Description: Excisional Instrument: Curette Bleeding: Minimum Hemostasis Achieved: Pressure End Time: 11:17 Procedural Pain: 0 Post Procedural Pain: 0 Response to Treatment: Procedure was tolerated well Level of  Consciousness (Post- Awake and Alert procedure): Post Debridement Measurements of Total Wound Length: (cm) 10 Width: (cm) 6.8 Depth: (cm) 0.1 Volume: (cm) 5.341 Character of Wound/Ulcer Post Debridement: Improved Severity of Tissue Post Debridement: Fat layer exposed Post Procedure Diagnosis Same as Pre-procedure Electronic Signature(s) Signed: 01/30/2022 11:48:24 AM By: Kalman Shan DO Signed: 01/30/2022 4:49:15 PM By: Deon Pilling RN, BSN Entered By: Deon Pilling on 01/30/2022 11:17:51 -------------------------------------------------------------------------------- HPI Details Patient Name: Date of Service: Corey Corey [name], Corey Corey [name]. 01/30/2022 11:15 A Corey [name] Medical Record Number: [id number] Patient Account Number: [id number] Date of Birth/Sex: Treating RN: 1946/02/29 (76 y.o. Corey [name]) Primary Care Provider: Billey Gosling Other Clinician: Referring Provider: Treating Provider/Extender: Mickle Asper in Treatment: 12 History of Present Illness HPI Description: Admission 11/01/2021 Corey Corey [name] Is a 76 year old male with a past medical history of of type 2 diabetes, coronary artery disease, stage IV kidney disease acute on chronic combined systolic and diastolic heart failure that presents to the clinic for a 30-monthhistory of right lower extremity wound. He states he is not sure how this started. It is completely eschared. He reports his wound has been stable for the past month. He has been keeping the area covered. He has mild chronic pain. He denies signs of infection. He states he has a history of lower extremity ulcers last being on the left leg. He could not tell me how this was treated in the past. 6/27; patient presents for follow-up. He is scheduled for his lower extremity arterial studies on July 6th. He has been using Medihoney to the wound bed. He has no issues or complaints today. He denies signs of infection. 7/6; patient presents for follow-up. He has home  health that reported the wound having odor and increased pain last week. He was sent to urgent care. He was started on doxycycline. He is continuing Medihoney. He has his scheduled ABIs w/TBIs today. 7/10; patient presents for follow-up. He had his arterial studies done last week. He had triphasic waveforms  throughout the right lower extremity. His TBI was 0.64. He has good pedal pulses. He is almost done with his course of antibiotics. He has been using mupirocin ointment to the wound bed. 7/17; patient presents for follow-up. He has been using Iodosorb under compression therapy. He has no issues or complaints today. He states he is receiving [facility] antibiotic tomorrow In the mail. 7/24; patient presents for follow-up. He has been doing Dakin's wet-to-dry dressings to the wound bed daily without issues. He currently denies signs of infection. He reports completing his course of doxycycline. He has his [facility] antibiotic with him today. 7/31; patient presents for follow-up. We have been using Iodoflex with [facility] under compression therapy. He has no issues or complaints today. He denies signs of infection. 8/7; patient presents for follow-up. We have been using Iodoflex with [facility] under compression therapy. Home health came out once last week to change the wrap. He has no issues or complaints today. He denies signs of infection. 8/14; patient presents for follow-up. We have been using Keystone with Hydrofera Blue under compression therapy. He has tolerated this well. He has no issues or complaints today. 8/21; patient presents for follow-up. We have been using Keystone with Hydrofera Blue under compression therapy. The Hydrofera Blue was stuck on tightly to the wound bed today. Other than that he has no issues or complaints. We have not heard back for insurance approval for skin sub. 8/28; patient presents for follow-up. We ordered blast X and collagen powder at last clinic visit. It is  unclear if he has received this. We placed Keystone with Fibracol under 3 layer compression last week. Patient has home health. Patient has no issues or complaints today. He was not approved for TheraSkin. 9/5; patient presents for follow-up. Insurance would not cover blast X. We do have a sample and he would like to try this. We have been using Keystone antibiotic and collagen under 3 layer compression. He has no issues or complaints today. 9/18; patient presents for follow-up. We have been using collagen with blast X and Keystone under compression therapy. He has no issues or complaints today. Electronic Signature(s) Signed: 01/30/2022 11:48:24 AM By: Kalman Shan DO Entered By: Kalman Shan on 01/30/2022 11:46:47 -------------------------------------------------------------------------------- Physical Exam Details Patient Name: Date of Service: Corey Corey [name], Corey Corey [name]. 01/30/2022 11:15 A Corey [name] Medical Record Number: [id number] Patient Account Number: [id number] Date of Birth/Sex: Treating RN: [birth year] (76 y.o. Corey [name]) Primary Care Provider: Billey Gosling Other Clinician: Referring Provider: Treating Provider/Extender: Mickle Asper in Treatment: 12 Constitutional respirations regular, non-labored and within target range for patient.. Cardiovascular 2+ dorsalis pedis/posterior tibialis pulses. Psychiatric pleasant and cooperative. Notes Right lower extremity: T the anterior aspect there is granulation tissue and nonviable tissue present. No signs of surrounding infection. Good edema control. o Electronic Signature(s) Signed: 01/30/2022 11:48:24 AM By: Kalman Shan DO Entered By: Kalman Shan on 01/30/2022 11:47:05 -------------------------------------------------------------------------------- Physician Orders Details Patient Name: Date of Service: Corey Corey [name], Corey Corey [name]. 01/30/2022 11:15 A Corey [name] Medical Record Number: [id number] Patient Account Number: [id number] Date  of Birth/Sex: Treating RN: August 08, 1945 (76 y.o. Corey Corey [name] Primary Care Provider: Billey Gosling Other Clinician: Referring Provider: Treating Provider/Extender: Mickle Asper in Treatment: 45 Verbal / Phone Orders: No Diagnosis Coding ICD-10 Coding Code Description J67.341 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 with other specified severity E11.622 Type 2 diabetes mellitus with other skin ulcer I50.42 Chronic combined systolic (congestive) and diastolic (congestive)  heart failure I25.119 Atherosclerotic heart disease of native coronary artery with unspecified angina pectoris I87.311 Chronic venous hypertension (idiopathic) with ulcer of right lower extremity Follow-up Appointments ppointment in 1 week. - Dr. Heber [place] and [place], Room 8 Monday Return A ppointment in 2 weeks. - Dr. Heber [place] and Tammi Klippel, Room 8 Monday Return A Other: - ****[facility] topical compounding antibiotics bring to appt each time.**** Bring in the blastx as well. Anesthetic (In clinic) Topical Lidocaine 5% applied to wound bed Cellular or Tissue Based Products Cellular or Tissue Based Product Type: - run insurance for [employer], epicord, and organogenesis advance tissue product. Theraskin not approved. Epicord not covered. Organ[employer] Apligraf approved. Bathing/ Shower/ Hygiene May shower with protection but do not get wound dressing(s) wet. Edema Control - Lymphedema / SCD / Other Elevate legs to the level of the heart or above for 30 minutes daily and/or when sitting, a frequency of: - 3-4 times a day throughout the day. Avoid standing for long periods of time. Home Health No change in wound care orders this week; continue Home Health for wound care. May utilize formulary equivalent dressing for wound treatment orders unless otherwise specified. - home health to twice a week. Compounding topical antibiotics directly to wound bed. Then Place collagen  and blastx. Use plain collagen over the topical antibiotics. Other Home Health Orders/Instructions: - [facility] Wound Treatment Wound #1 - Lower Leg Wound Laterality: Right, Lateral Cleanser: Soap and Water (Home Health) 3 x Per Week/30 Days Discharge Instructions: May shower and wash wound with dial antibacterial soap and water prior to dressing change. Cleanser: Wound Cleanser (Home Health) 3 x Per Week/30 Days Discharge Instructions: Cleanse the wound with wound cleanser prior to applying a clean dressing using gauze sponges, not tissue or cotton balls. Topical: collagen and blastx (Home Health) 3 x Per Week/30 Days Discharge Instructions: apply over the topical compounding antibiotics. Topical: [facility]-topical compounding antibiotics (Home Health) 3 x Per Week/30 Days Discharge Instructions: apply directly to wound bed. Prim Dressing: Fibracol Plus Dressing, 4x4.38 in (collagen) (Home Health) 3 x Per Week/30 Days ary Discharge Instructions: Moisten collagen with saline or hydrogel until the blastx and powder collagen arrive. Secondary Dressing: ABD Pad, 8x10 (Home Health) 3 x Per Week/30 Days Discharge Instructions: Apply over primary dressing as directed. Secondary Dressing: Woven Gauze Sponge, Non-Sterile 4x4 in (Home Health) 3 x Per Week/30 Days Discharge Instructions: Apply over primary dressing as directed. Secondary Dressing: Zetuvit Plus 4x8 in (Home Health) 3 x Per Week/30 Days Discharge Instructions: Apply over primary dressing as directed. Compression Wrap: ThreePress (3 layer compression wrap) 3 x Per Week/30 Days Discharge Instructions: Apply three layer compression as directed. Electronic Signature(s) Signed: 01/30/2022 11:48:24 AM By: Kalman Shan DO Entered By: Kalman Shan on 01/30/2022 11:47:11 -------------------------------------------------------------------------------- Problem List Details Patient Name: Date of Service: Corey Corey [name], Corey Y  Corey [name]. 01/30/2022 11:15 A Corey [name] Medical Record Number: [id number] Patient Account Number: [id number] Date of Birth/Sex: Treating RN: 1945/07/03 (76 y.o. Corey Corey [name] Primary Care Provider: Billey Gosling Other Clinician: Referring Provider: Treating Provider/Extender: Mickle Asper in Treatment: 12 Active Problems ICD-10 Encounter Code Description Active Date MDM Diagnosis L97.918 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 with other 11/01/2021 No Yes specified severity E11.622 Type 2 diabetes mellitus with other skin ulcer 11/01/2021 No Yes I50.42 Chronic combined systolic (congestive) and diastolic (congestive) heart failure 11/01/2021 No Yes I25.119 Atherosclerotic heart disease of native coronary artery with unspecified angina 11/01/2021 No Yes  pectoris I87.311 Chronic venous hypertension (idiopathic) with ulcer of right lower extremity 12/26/2021 No Yes Inactive Problems Resolved Problems Electronic Signature(s) Signed: 01/30/2022 11:48:24 AM By: Kalman Shan DO Entered By: Kalman Shan on 01/30/2022 11:44:59 -------------------------------------------------------------------------------- Progress Note Details Patient Name: Date of Service: Corey Corey [name], Corey Corey [name]. 01/30/2022 11:15 A Corey [name] Medical Record Number: [id number] Patient Account Number: [id number] Date of Birth/Sex: Treating RN: Sep 27, 1945 (76 y.o. Corey [name]) Primary Care Provider: Billey Gosling Other Clinician: Referring Provider: Treating Provider/Extender: Mickle Asper in Treatment: 12 Subjective Chief Complaint Information obtained from Patient 11/01/2021; right lower extremity wound History of Present Illness (HPI) Admission 11/01/2021 Mr. Cordero Surette Is a 76 year old male with a past medical history of of type 2 diabetes, coronary artery disease, stage IV kidney disease acute on chronic combined systolic and diastolic heart failure that presents to the clinic for a  68-monthhistory of right lower extremity wound. He states he is not sure how this started. It is completely eschared. He reports his wound has been stable for the past month. He has been keeping the area covered. He has mild chronic pain. He denies signs of infection. He states he has a history of lower extremity ulcers last being on the left leg. He could not tell me how this was treated in the past. 6/27; patient presents for follow-up. He is scheduled for his lower extremity arterial studies on July 6th. He has been using Medihoney to the wound bed. He has no issues or complaints today. He denies signs of infection. 7/6; patient presents for follow-up. He has home health that reported the wound having odor and increased pain last week. He was sent to urgent care. He was started on doxycycline. He is continuing Medihoney. He has his scheduled ABIs w/TBIs today. 7/10; patient presents for follow-up. He had his arterial studies done last week. He had triphasic waveforms throughout the right lower extremity. His TBI was 0.64. He has good pedal pulses. He is almost done with his course of antibiotics. He has been using mupirocin ointment to the wound bed. 7/17; patient presents for follow-up. He has been using Iodosorb under compression therapy. He has no issues or complaints today. He states he is receiving [facility] antibiotic tomorrow In the mail. 7/24; patient presents for follow-up. He has been doing Dakin's wet-to-dry dressings to the wound bed daily without issues. He currently denies signs of infection. He reports completing his course of doxycycline. He has his Keystone antibiotic with him today. 7/31; patient presents for follow-up. [facility] He has no issues or complaints today. He denies signs of infection. 8/7; patient presents for follow-up. [facility] Home health came out  once last week to change the wrap. He has no issues or complaints today. He denies signs of infection. 8/14; patient presents for follow-up. We have been using Keystone with Hydrofera Blue under compression therapy. He has tolerated this well. He has no issues or complaints today. 8/21; patient presents for follow-up. We have been using Keystone with Hydrofera Blue under compression therapy. The Hydrofera Blue was stuck on tightly to the wound bed today. Other than that he has no issues or complaints. We have not heard back for insurance approval for skin sub. 8/28; patient presents for follow-up. We ordered blast X and collagen powder at last clinic visit. It is unclear if he has received this. We placed [facility]with Fibracol under  3 layer compression last week. Patient has home health. Patient has no issues or complaints today. He was not approved for TheraSkin. 9/5; patient presents for follow-up. Insurance would not cover blast X. We do have a sample and he would like to try this. We have been using Keystone antibiotic and collagen under 3 layer compression. He has no issues or complaints today. 9/18; patient presents for follow-up. We have been using collagen with blast X and Keystone under compression therapy. He has no issues or complaints today. Patient History Family History Cancer - Father, Diabetes - Mother,Father, Heart Disease - Mother,Siblings, Hypertension - Mother,Father. Social History Former smoker - quit 1970, Marital Status - Married, Drug Use - No History, Caffeine Use - Moderate - coffee. Medical History Eyes Patient has history of Cataracts Respiratory Patient has history of Sleep Apnea - has CPAP does not wear Cardiovascular Patient has history of Congestive Heart Failure, Hypertension, Myocardial Infarction - 2014 Endocrine Patient has history of Type II Diabetes Musculoskeletal Patient has history of Gout Neurologic Denies history of Seizure  Disorder Hospitalization/Surgery History - 06/2021 inguinal hernia repair. - cardioversion 09/2020. - ventral hernia repair 3/19. 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 Vitals Time Taken: 10:55 AM, Height: 66 in, Weight: 145 lbs, BMI: 23.4, Temperature: 98 F, Pulse: 52 bpm, Respiratory Rate: 20 breaths/min, Blood Pressure: 161/76 mmHg. Cardiovascular 2+ dorsalis pedis/posterior tibialis pulses. Psychiatric pleasant and cooperative. General Notes: Right lower extremity: T the anterior aspect there is granulation tissue and nonviable tissue present. No signs of surrounding infection. Good o edema control. Integumentary (Hair, Skin) Wound #1 status is Open. Original cause of wound was Gradually Appeared. The date acquired was: 08/15/2021. The wound has been in treatment 12 weeks. The wound is located on the Right,Lateral Lower Leg. The wound measures 10cm length x 6.8cm width x 0.1cm depth; 53.407cm^2 area and 5.341cm^3 volume. There is Fat Layer (Subcutaneous Tissue) exposed. There is no tunneling or undermining noted. There is a medium amount of serosanguineous drainage noted. The wound margin is distinct with the outline attached to the wound base. There is large (67-100%) red, pink granulation within the wound bed. There is a small (1-33%) amount of necrotic tissue within the wound bed including Adherent Slough. 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 Type 2 diabetes mellitus with other skin ulcer Chronic combined systolic (congestive) and diastolic (congestive) heart failure Atherosclerotic heart disease of native coronary artery with  unspecified angina pectoris Chronic venous hypertension (idiopathic) with ulcer of right lower extremity Patient's wound has shown improvement in size and appearance since last clinic visit. I debrided nonviable tissue. No surrounding signs of infection. I recommended continuing the course with blast X, collagen and Keystone antibiotic under compression therapy. Procedures Wound #1 Pre-procedure diagnosis of Wound #1 is a Diabetic Wound/Ulcer of the Lower Extremity located on the Right,Lateral Lower Leg .Severity of Tissue Pre Debridement is: Fat layer exposed. There was a Excisional Skin/Subcutaneous Tissue Debridement with a total area of 68 sq cm performed by Kalman Shan, DO. With the following instrument(s): Curette to remove Viable and Non-Viable tissue/material. Material removed includes Subcutaneous Tissue and Slough and after achieving pain control using Lidocaine 5% topical ointment. A time out was conducted at 11:10, prior to the start of the procedure. A  Minimum amount of bleeding was controlled with Pressure. The procedure was tolerated well with a pain level of 0 throughout and a pain level of 0 following the procedure. Post Debridement Measurements: 10cm length x 6.8cm width x 0.1cm depth; 5.341cm^3 volume. Character of Wound/Ulcer Post Debridement is improved. Severity of Tissue Post Debridement is: Fat layer exposed. Post procedure Diagnosis Wound #1: Same as Pre-Procedure Pre-procedure diagnosis of Wound #1 is a Diabetic Wound/Ulcer of the Lower Extremity located on the Right,Lateral Lower Leg . There was a Three Layer Compression Therapy Procedure by Deon Pilling, RN. Post procedure Diagnosis Wound #1: Same as Pre-Procedure Plan Follow-up Appointments: Return Appointment in 1 week. - Dr. Heber [place] and [place], Room 8 Monday Return Appointment in 2 weeks. - Dr. Heber Dibble and Tammi Klippel, Room 8 Monday Other: - ****[facility] topical compounding antibiotics bring to appt each  time.**** Bring in the blastx as well. Anesthetic: (In clinic) Topical Lidocaine 5% applied to wound bed Cellular or Tissue Based Products: Cellular or Tissue Based Product Type: - run insurance for [employer], epicord, and organogenesis advance tissue product. Theraskin not approved. Epicord not covered. [employer] Apligraf approved. Bathing/ Shower/ Hygiene: May shower with protection but do not get wound dressing(s) wet. Edema Control - Lymphedema / SCD / Other: Elevate legs to the level of the heart or above for 30 minutes daily and/or when sitting, a frequency of: - 3-4 times a day throughout the day. Avoid standing for long periods of time. Home Health: No change in wound care orders this week; continue Home Health for wound care. May utilize formulary equivalent dressing for wound treatment orders unless otherwise specified. - home health to twice a week. Compounding topical antibiotics directly to wound bed. Then Place collagen and blastx. Use plain collagen over the topical antibiotics. Other Home Health Orders/Instructions: - [facility] WOUND #1: - Lower Leg Wound Laterality: Right, Lateral Cleanser: Soap and Water (Home Health) 3 x Per Week/30 Days Discharge Instructions: May shower and wash wound with dial antibacterial soap and water prior to dressing change. Cleanser: Wound Cleanser (Home Health) 3 x Per Week/30 Days Discharge Instructions: Cleanse the wound with wound cleanser prior to applying a clean dressing using gauze sponges, not tissue or cotton balls. Topical: collagen and blastx (Home Health) 3 x Per Week/30 Days Discharge Instructions: apply over the topical compounding antibiotics. Topical: [facility]-topical compounding antibiotics (Home Health) 3 x Per Week/30 Days Discharge Instructions: apply directly to wound bed. Prim Dressing: Fibracol Plus Dressing, 4x4.38 in (collagen) (Home Health) 3 x Per Week/30 Days ary Discharge Instructions: Moisten  collagen with saline or hydrogel until the blastx and powder collagen arrive. Secondary Dressing: ABD Pad, 8x10 (Home Health) 3 x Per Week/30 Days Discharge Instructions: Apply over primary dressing as directed. Secondary Dressing: Woven Gauze Sponge, Non-Sterile 4x4 in (Home Health) 3 x Per Week/30 Days Discharge Instructions: Apply over primary dressing as directed. Secondary Dressing: Zetuvit Plus 4x8 in (Home Health) 3 x Per Week/30 Days Discharge Instructions: Apply over primary dressing as directed. Com pression Wrap: ThreePress (3 layer compression wrap) 3 x Per Week/30 Days Discharge Instructions: Apply three layer compression as directed. 1. In office sharp debridement 2. Blast X, collagen and [facility] antibiotic under 3 layer compression 3. Follow-up in 1 week Electronic Signature(s) Signed: 01/30/2022 11:48:24 AM By: Kalman Shan DO Entered By: Kalman Shan on 01/30/2022 11:48:05 -------------------------------------------------------------------------------- HxROS Details Patient Name: Date of Service: Corey Corey [name], Corey Corey [name]. 01/30/2022 11:15 A Corey [name] Medical Record Number: [id number] Patient Account  Number: [id number] Date of Birth/Sex: Treating RN: 1945/12/01 (76 y.o. Corey [name]) Primary Care Provider: Billey Gosling Other Clinician: Referring Provider: Treating Provider/Extender: [name] in Treatment: 12 Constitutional Symptoms (General Health) Medical History: Past Medical History Notes: CVA 2014 bilateral hearing loss Eyes Medical History: Positive for: Cataracts Respiratory Medical History: Positive for: Sleep Apnea - has CPAP does not wear Cardiovascular Medical History: Positive for: Congestive Heart Failure; Hypertension; Myocardial Infarction - 2014 Gastrointestinal Medical History: Past Medical History Notes: Diverticulosis Endocrine Medical History: Positive for: Type II Diabetes Time with diabetes: >20 years Treated with: Oral  agents, Diet Blood sugar tested every day: Yes Tested : BID Genitourinary Medical History: Past Medical History Notes: Renal insufficiency 20/ Musculoskeletal Medical History: Positive for: Gout Neurologic Medical History: Negative for: Seizure Disorder Oncologic Medical History: Past Medical History Notes: Prostate Ca HBO Extended History Items Eyes: Cataracts Immunizations Pneumococcal Vaccine: Received Pneumococcal Vaccination: No Implantable Devices No devices added Hospitalization / Surgery History Type of Hospitalization/Surgery 06/2021 inguinal hernia repair cardioversion 09/2020 ventral hernia repair 3/19 Family and Social History Cancer: Yes - Father; Diabetes: Yes - Mother,Father; Heart Disease: Yes - Mother,Siblings; Hypertension: Yes - Mother,Father; Former smoker - quit 1970; Marital Status - Married; Drug Use: No History; Caffeine Use: Moderate - coffee; Financial Concerns: No; Food, Clothing or Shelter Needs: No; Support System Lacking: No; Transportation Concerns: No Electronic Signature(s) Signed: 01/30/2022 11:48:24 AM By: Kalman Shan DO Entered By: Kalman Shan on 01/30/2022 11:46:51 -------------------------------------------------------------------------------- SuperBill Details Patient Name: Date of Service: Corey Corey [name], Corey Corey [name]. 01/30/2022 Medical Record Number: [id number] Patient Account Number: [id number] Date of Birth/Sex: Treating RN: May 17, 1945 (76 y.o. Corey Corey [name] Primary Care Provider: Billey Gosling Other Clinician: Referring Provider: Treating Provider/Extender: Mickle Asper in Treatment: 12 Diagnosis Coding ICD-10 Codes Code Description [phone] 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 with other specified severity E11.622 Type 2 diabetes mellitus with other skin ulcer I50.42 Chronic combined systolic (congestive) and diastolic (congestive) heart failure I25.119 Atherosclerotic  heart disease of native coronary artery with unspecified angina pectoris I87.311 Chronic venous hypertension (idiopathic) with ulcer of right lower extremity Facility Procedures CPT4 Code: [id number] Description: 41324 - DEB SUBQ TISSUE 20 SQ CM/< ICD-10 Diagnosis Description L97.918 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 with other spe Modifier: cified severity Quantity: 1 CPT4 Code: [id number] Description: 36644 - DEB SUBQ TISS EA ADDL 20CM ICD-10 Diagnosis Description L97.918 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 with other spe Modifier: cified severity Quantity: 3 Physician Procedures : CPT4 Code Description Modifier [id number] 11042 - WC PHYS SUBQ TISS 20 SQ CM ICD-10 Diagnosis Description L97.918 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 with other specified severity [id number] 11045 - WC PHYS SUBQ TISS EA ADDL 20  CM 3 ICD-10 Diagnosis Description L97.918 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 with other specified severity Quantity: 1 Electronic Signature(s) Signed: 01/30/2022 11:48:24 AM By: Kalman Shan DO Entered By: Kalman Shan on 01/30/2022 11:48:12

## 2022-01-30 NOTE — Progress Notes (Addendum)
Anesthesia Chart Review: Kathleene Hazel  Case: [id number] Date/Time: 01/31/22 0903   Procedure: LEFT ARM ARTERIOVENOUS (AV) FISTULA VERSUS ARTERIOVENOUS GRAFT CREATION (Left)   Anesthesia type: Choice   Pre-op diagnosis: CKD V   Location: [facility] OR ROOM 11 / [facility]   Surgeons: Waynetta Sandy, MD       DISCUSSION: Patient is a 76 year old male scheduled for the above procedure. He has CKD stage V. As of 01/18/22, he was not currently on hemodialysis, but AVF versus AVGG planned in anticipation of need for hemodialysis in the near future.   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  Last visit with cardiology was on 01/12/22 with Dr. Marlou Porch. EKG showed new afib with slow ventricular response at 45 bpm. ASA and Carvedilol stopped, and he was started on Eliquis 5 mg BID. Four month follow-up planned. He did not order any repeat echo, and notes Mr.  Arenson was getting a fistula consultation for anticipated hemodialysis. (Prior to 06/16/21 inguinal hernia repair, cardiologists Dr. Audie Box and Dr. Marlou Porch felt he would be high, but not prohibitive risk, for any procedure and had recommended close attention to volume status.)  Per [facility]VS, instructed to hold Eliquis for 48 hours. He reported last Eliquis 01/27/2022.  Reviewed history with anesthesiologist Suzette Battiest, MD. Anesthesia team to evaluate on the day of surgery.    VS: Ht '5\' 6"'$  (1.676 m)   Wt 73.5 kg   BMI 26.15 kg/m  BP Readings from Last 3 Encounters:  01/18/22 (!) 164/96  01/12/22 120/60  01/05/22 (!) 156/70   Pulse Readings from Last 3 Encounters:  01/18/22 (!) 53  01/12/22 (!) 54  01/05/22 (!) 44     PROVIDERS: [name], MD is PCP  Candee Furbish, MD is cardiologist Eleonore Chiquito, MD is cardiologist (for herediatry cardiac amyloidosis). He was evaluated on 03/10/21 to discuss testing results and potential testing for family members. He was started on tafamidis at 61 mg, "No further titration of the dose is needed." He will continue follow-up with Dr. Marlou Porch. Elayne Snare, MD is endocrinologist Kalman Shan, DO is wound care provider. Last visit 01/17/22 for RLE wound.   LABS: For day of surgery. Last results from 01/05/22 show BUN 56, Cre 3.75, AST 61, ALT 31, Pro BNP 1740, H/H 10.6/33.1, PLT 128.   OTHER: EEG 12/09/18: Impression: This awake and asleep EEG is normal.   Clinical Correlation: A normal EEG does not exclude a clinical diagnosis of epilepsy.  If further clinical questions remain, prolonged EEG may be helpful.  Clinical correlation is advised.  Home Sleep Study 03/22/18:  IMPRESSIONS: Moderate obstructive sleep apnea with an AHI of 26.6 and SPO2 low  of 81%.   IMAGES: CXR 01/05/22: INDINGS: Stable cardiomediastinal silhouette. Right lung is clear. Stable mild left basilar atelectasis or scarring is noted. No pneumothorax or pleural  effusion is noted. Bony thorax is unremarkable. IMPRESSION: Stable mild left basilar atelectasis or scarring is noted.    EKG: 01/12/22 ([facility]):  Atrial fibrillation with slow ventricular response.  Ventricular rate 45 bpm. Left axis deviation. Nonspecific intraventricular conduction delay. Nonspecific T wave abnormality, probable digitalis effect.   CV: Myocardial Amyloid Imaging Scan 12/31/20: Study Highlights: Findings are equivocal for TTR amyloidosis.  Visual score is 1 and H/CLL ratio is 1.4.     Echo (Limited) 09/30/20: IMPRESSIONS   1. Trace pericardial fluid posterior and around right AV groove. Compared  to prior study 09/21/20, pericardial effusion has essentially resolved.   2. Left ventricular ejection fraction, by estimation, is 40%. The left  ventricle has moderately decreased function. The left ventricle  demonstrates global hypokinesis. There is severe left ventricular  hypertrophy.   3. Right ventricular systolic function is moderately reduced. The right  ventricular size is normal. Severely increased right ventricular wall  thickness.   4. Trivial mitral valve regurgitation.   5. The inferior vena cava is dilated in size with <50% respiratory  variability, suggesting right atrial pressure of 15 mmHg.    Echo (Complete) 09/21/20: IMPRESSIONS   1. There is no left ventricular thrombus with Definity contrast. Left  ventricular ejection fraction, by estimation, is 35 to 40%. The left  ventricle has moderately decreased function. The left ventricle  demonstrates regional wall motion abnormalities  (see scoring diagram/findings for description). There is global left  ventricular hypokinesis, disproportionately severe in the mid-apical  anterior and anterolateral walls and apex. The anterior septum has  relatively preserved contractility. There is  severe concentric left ventricular hypertrophy. Left ventricular diastolic  parameters are consistent with  Grade II diastolic dysfunction  (pseudonormalization). Elevated left atrial pressure.   2. Right ventricular systolic function is moderately reduced. The right  ventricular size is normal. Mildly increased right ventricular wall  thickness. There is moderately elevated pulmonary artery systolic  pressure.   3. Left atrial size was severely dilated.   4. Right atrial size was mildly dilated.   5. Large pericardial effusion. The pericardial effusion is  circumferential. There is no evidence of cardiac tamponade.   6. The mitral valve is normal in structure. Mild mitral valve  regurgitation.   7. The aortic valve is tricuspid. Aortic valve regurgitation is not  visualized. No aortic stenosis is present.   8. The inferior vena cava is dilated in size with <50% respiratory  variability, suggesting right atrial pressure of 15 mmHg.  - Comparison(s): The left ventricular function is significantly worse. The  left ventricular wall motion abnormality is worse. The pericardial  effusion is slightly larger. There are no overt findings to support  tamponade, other than inferior vena cava  dilation. However, the sensitivity of echo for the diagnosis of  pericardial tamponade is diminished in the setting of pulmonary artery  hypertension.    RHC/LHS 09/20/20: Hemodynamic findings consistent with moderate pulmonary hypertension. LV end diastolic pressure is severely elevated. There is no aortic valve stenosis. Mid LAD to Dist LAD lesion is 80% stenosed. Ramus lesion is 20% stenosed.   SUMMARY Severe single-vessel disease with heavily calcified mid LAD 80% stenosis Acute Combined Systolic and Diastolic Heart Failure (systolic function appears to be reduced, to conserve contrast, LV gram not performed), but LVEDP 32 mmHg and PCWP 28  mmHg. Systemic Hypertension with Mean Arterial Pressure 123 mmHg Secondary Pulmonary Hypertension: PAP 67/19 mmHg-mean 39 mmHg with RVP-EDP of 67/2 mmHg-15 mmHg, RVP and  RAP of 14 mmHg. Moderate to severely reduced Cardiac Output-Index: 4.04-2.09   RECOMMENDATIONS With significant CHF, orthopnea and PND with LVEDP of 32 mmHg, patient is not stable for LAD PCI which would likely require atherectomy. We will admit to cardiology service for titration of cardiac meds and diuresis: Converting losartan to [facility], otherwise continue home medications. IV Lasix 40 mg twice daily Check 2D echo Reassess volume status in the next 1 to 2 days to determine feasibility for staged LAD PCI (via femoral access due to extreme tortuosity of the innominate artery.)  Slight scale insulin; consider adding Wilder Glade or Jardiance   Nuclear stress test 10/10/19: Nuclear stress EF: 37%. The left ventricular ejection fraction is moderately decreased (30-44%). The LV is severely dilated. There was no ST segment deviation noted during stress. This is an intermediate risk study. There is no evidence of ischemia or previous infarction   MRI Cardiac 03/28/12: 1) Severe aysmetric septal hypertrophy with no SAM.  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 on delayed enhancement images  2) EF 62% with no RWMA's  3) Mild LAE  4) Moderate posterior and lateral pericardial effusion  5) No convincing evidence of amyloid as most of the myocardium could be nulled on delayed enhancement images    Past Medical History:  Diagnosis Date   Cataract    CHF (congestive heart failure) (HCC)    Chronic heart failure with preserved ejection fraction (HFpEF) ([place])    Colon polyp 2003   Dr [name]; F/U was to be 2008( not completed)   Coronary artery disease    CVA (cerebral infarction) 2014   Diabetes mellitus    Diverticulosis 2003   Dyspnea    with exertion   Gout    Hereditary cardiac amyloidosis (HCC)    Hypertension    Myocardial infarction ([place]) 09/19/2012   PEA cardiac arrest in setting of acute respiratory failure/pulmonary edema   OSA (obstructive sleep apnea)  03/27/2018   Pneumonia 09/29/2011   Avelox X 10 days as OP   Prostate cancer ([place])    Renal insufficiency    Seizures ([place]) 09/19/2012   not treated for seizure disorder; had a seizure after stroke 2014; no seizure since then   Stroke [facility])     Past Surgical History:  Procedure Laterality Date   COLONOSCOPY W/ POLYPECTOMY  2003   no F/U ([place] discussed 12/03/12)   INGUINAL HERNIA REPAIR Left 06/16/2021   Procedure: LAPAROSCOPIC, POSSIBLY OPEN LEFT INGUINAL HERNIA REPAIR WITH MESH;  Surgeon: Felicie Morn, MD;  Location: [facility];  Service: General;  Laterality: Left;   INSERTION OF MESH N/A 07/16/2017   Procedure: INSERTION OF MESH;  Surgeon: [name], MD;  Location: [place];  Service: General;  Laterality: N/A;   PEG PLACEMENT  2014   PEG TUBE REMOVAL  2014   PROSTATE BIOPSY     RIGHT/LEFT HEART CATH AND CORONARY ANGIOGRAPHY N/A 09/20/2020   Procedure: RIGHT/LEFT HEART CATH AND CORONARY ANGIOGRAPHY;  Surgeon: Leonie Man, MD;  Location: [place];  Service: Cardiovascular;  Laterality: N/A;   TEE WITHOUT CARDIOVERSION N/A 09/27/2020   Procedure: TRANSESOPHAGEAL ECHOCARDIOGRAM (TEE);  Surgeon: Wonda Olds, MD;  Location: [facility] OR;  Service: Thoracic;  Laterality: N/A;   TRACHEOSTOMY  2014   was closed after hospital stay   VENTRAL HERNIA  REPAIR N/A 07/16/2017   Procedure: LAPAROSCOPIC VENTRAL HERNIA REPAIR WITH MESH;  Surgeon: [name], MD;  Location: [facility] OR;  Service: General;  Laterality: N/A;   wrist aspiration  02/16/12    monosodium urate crystals; Dr Caralyn Guile    MEDICATIONS: No current facility-administered medications for this encounter.    albuterol (VENTOLIN HFA) 108 (90 Base) MCG/ACT inhaler   allopurinol (ZYLOPRIM) 100 MG tablet   apixaban (ELIQUIS) 5 MG TABS tablet   bismuth subsalicylate (PEPTO BISMOL) 262 MG/15ML suspension   colchicine (COLCRYS) 0.6 MG tablet   ergocalciferol (VITAMIN D2) 1.25 MG (50000 UT) capsule   hydrALAZINE  (APRESOLINE) 50 MG tablet   JANUVIA 25 MG tablet   nitroGLYCERIN (NITROSTAT) 0.4 MG SL tablet   rosuvastatin (CRESTOR) 40 MG tablet   Tafamidis (VYNDAMAX) 61 MG CAPS   torsemide (DEMADEX) 20 MG tablet   ACCU-CHEK AVIVA PLUS test strip   ACCU-CHEK AVIVA PLUS test strip   glipiZIDE (GLUCOTROL XL) 2.5 MG 24 hr tablet   TRUEplus Lancets 33G MISC    Myra Gianotti, PA-C Surgical Short Stay/Anesthesiology [facility] Phone [phone] [facility] Phone [phone] 01/30/2022 12:37 PM

## 2022-01-30 NOTE — Progress Notes (Signed)
ASHETON, SCHEFFLER ([id number]) Visit Report for 01/30/2022 Arrival Information Details Patient Name: Date of Service: Corey [name]. 01/30/2022 11:15 A M Medical Record Number: [id number] Patient Account Number: [id number] Date of Birth/Sex: Treating RN: 10/22/1945 (76 y.o. [name] Primary Care Davene Jobin: Billey Gosling Other Clinician: Referring Callin Ashe: Treating Jerrianne Hartin/Extender: Mickle Asper in Treatment: 12 Visit Information History Since Last Visit Added or deleted any medications: No Patient Arrived: Ambulatory Any new allergies or adverse reactions: No Arrival Time: 10:55 Had a fall or experienced change in No Accompanied By: self activities of daily living that may affect Transfer Assistance: None risk of falls: Patient Identification Verified: Yes Signs or symptoms of abuse/neglect since last visito No Secondary Verification Process Completed: Yes Hospitalized since last visit: No Patient Requires Transmission-Based No Implantable device outside of the clinic excluding No Precautions: cellular tissue based products placed in the center Patient Has Alerts: Yes since last visit: Patient Alerts: in clinic R [place] Has Dressing in Place as Prescribed: Yes 11/17/2021 ABIs: both [place] Has Compression in Place as Prescribed: Yes 11/17/2021 TBI: R0.64 L0.58 Pain Present Now: No Electronic Signature(s) Signed: 01/30/2022 4:49:15 PM By: Deon Pilling RN, BSN Entered By: Deon Pilling on 01/30/2022 10:58:28 -------------------------------------------------------------------------------- Compression Therapy Details Patient Name: Date of Service: Corey [name], [name] 01/30/2022 11:15 A M Medical Record Number: [id number] Patient Account Number: [id number] Date of Birth/Sex: Treating RN: [birth year] (76 y.o. [name] Primary Care Daevon Holdren: Billey Gosling Other Clinician: Referring Norely Schlick: Treating Azlin Zilberman/Extender: Mickle Asper in  Treatment: 12 Compression Therapy Performed for Wound Assessment: Wound #1 Right,Lateral Lower Leg Performed By: Clinician Deon Pilling, RN Compression Type: Three Layer Post Procedure Diagnosis Same as Pre-procedure Electronic Signature(s) Signed: 01/30/2022 4:49:15 PM By: Deon Pilling RN, BSN Entered By: Deon Pilling on 01/30/2022 11:18:04 -------------------------------------------------------------------------------- Encounter Discharge Information Details Patient Name: Date of Service: Corey [name], [name] 01/30/2022 11:15 A M Medical Record Number: [id number] Patient Account Number: [id number] Date of Birth/Sex: Treating RN: [birth year] (76 y.o. [name] Primary Care Rielyn Krupinski: Billey Gosling Other Clinician: Referring Fauna Neuner: Treating Charline Hoskinson/Extender: Mickle Asper in Treatment: 12 Encounter Discharge Information Items Post Procedure Vitals Discharge Condition: Stable Temperature (F): 98 Ambulatory Status: Ambulatory Pulse (bpm): 52 Discharge Destination: Home Respiratory Rate (breaths/min): 20 Transportation: Private Auto Blood Pressure (mmHg): 161/76 Accompanied By: self Schedule Follow-up Appointment: Yes Clinical Summary of Care: Electronic Signature(s) Signed: 01/30/2022 4:49:15 PM By: Deon Pilling RN, BSN Entered By: Deon Pilling on 01/30/2022 11:20:51 -------------------------------------------------------------------------------- Lower Extremity Assessment Details Patient Name: Date of Service: Corey [name]. 01/30/2022 11:15 A M Medical Record Number: [id number] Patient Account Number: [id number] Date of Birth/Sex: Treating RN: 02/13/1946 (76 y.o. [name] Primary Care Jayd Forrey: Billey Gosling Other Clinician: Referring Rheya Minogue: Treating Adonys Wildes/Extender: [name] in Treatment: 12 Edema Assessment Assessed: [Left: No] [Right: Yes] Edema: [Left: Ye] [Right: s] Calf Left:  Right: Point of Measurement: 33 cm From Medial Instep 38.5 cm Ankle Left: Right: Point of Measurement: 11 cm From Medial Instep 22 cm Vascular Assessment Pulses: Dorsalis Pedis Palpable: [Right:Yes] Electronic Signature(s) Signed: 01/30/2022 4:49:15 PM By: Deon Pilling RN, BSN Entered By: Deon Pilling on 01/30/2022 11:01:17 -------------------------------------------------------------------------------- Multi Wound Chart Details Patient Name: Date of Service: Corey [name], [name] 01/30/2022 11:15 A M Medical Record Number: [id number] Patient Account Number: [id number] Date of Birth/Sex: Treating RN: 07/09/1945 (76 y.o. M) Primary Care Jeriyah Granlund: Quay Burow,  [place] Other Clinician: Referring Merdith Boyd: Treating Vicky Schleich/Extender: Mickle Asper in Treatment: 12 Vital Signs Height(in): 66 Pulse(bpm): 52 Weight(lbs): 145 Blood Pressure(mmHg): 161/76 Body Mass Index(BMI): 23.4 Temperature(F): 98 Respiratory Rate(breaths/min): 20 Photos: [N/A:N/A] Right, Lateral Lower Leg N/A N/A Wound Location: Gradually Appeared N/A N/A Wounding Event: Diabetic Wound/Ulcer of the Lower N/A N/A Primary Etiology: Extremity Cataracts, Sleep Apnea, Congestive N/A N/A Comorbid History: Heart Failure, Hypertension, Myocardial Infarction, Type II Diabetes, Gout 08/15/2021 N/A N/A Date Acquired: 12 N/A N/A Weeks of Treatment: Open N/A N/A Wound Status: No N/A N/A Wound Recurrence: 10x6.8x0.1 N/A N/A Measurements L x W x D (cm) 53.407 N/A N/A A (cm) : rea 5.341 N/A N/A Volume (cm) : -147.30% N/A N/A % Reduction in A rea: -147.30% N/A N/A % Reduction in Volume: Grade 2 N/A N/A Classification: Medium N/A N/A Exudate A mount: Serosanguineous N/A N/A Exudate Type: red, brown N/A N/A Exudate Color: Distinct, outline attached N/A N/A Wound Margin: Large (67-100%) N/A N/A Granulation A mount: Red, Pink N/A N/A Granulation Quality: Small (1-33%) N/A N/A Necrotic  A mount: Fat Layer (Subcutaneous Tissue): Yes N/A N/A Exposed Structures: Fascia: No Tendon: No Muscle: No Joint: No Bone: No Medium (34-66%) N/A N/A Epithelialization: Debridement - Excisional N/A N/A Debridement: Pre-procedure Verification/Time Out 11:10 N/A N/A Taken: Lidocaine 5% topical ointment N/A N/A Pain Control: Subcutaneous, Slough N/A N/A Tissue Debrided: Skin/Subcutaneous Tissue N/A N/A Level: 68 N/A N/A Debridement A (sq cm): rea Curette N/A N/A Instrument: Minimum N/A N/A Bleeding: Pressure N/A N/A Hemostasis A chieved: 0 N/A N/A Procedural Pain: 0 N/A N/A Post Procedural Pain: Procedure was tolerated well N/A N/A Debridement Treatment Response: 10x6.8x0.1 N/A N/A Post Debridement Measurements L x W x D (cm) 5.341 N/A N/A Post Debridement Volume: (cm) Compression Therapy N/A N/A Procedures Performed: Debridement Treatment Notes Wound #1 (Lower Leg) Wound Laterality: Right, Lateral Cleanser Soap and Water Discharge Instruction: May shower and wash wound with dial antibacterial soap and water prior to dressing change. Wound Cleanser Discharge Instruction: Cleanse the wound with wound cleanser prior to applying a clean dressing using gauze sponges, not tissue or cotton balls. Peri-Wound Care Topical collagen and blastx Discharge Instruction: apply over the topical compounding antibiotics. [facility]-topical compounding antibiotics Discharge Instruction: apply directly to wound bed. Primary Dressing Fibracol Plus Dressing, 4x4.38 in (collagen) Discharge Instruction: Moisten collagen with saline or hydrogel until the blastx and powder collagen arrive. Secondary Dressing ABD Pad, 8x10 Discharge Instruction: Apply over primary dressing as directed. Woven Gauze Sponge, Non-Sterile 4x4 in Discharge Instruction: Apply over primary dressing as directed. Zetuvit Plus 4x8 in Discharge Instruction: Apply over primary dressing as  directed. Secured With Compression Wrap ThreePress (3 layer compression wrap) Discharge Instruction: Apply three layer compression as directed. Compression Stockings Add-Ons Electronic Signature(s) Signed: 01/30/2022 11:48:24 AM By: Kalman Shan DO Entered By: Kalman Shan on 01/30/2022 11:45:05 -------------------------------------------------------------------------------- Multi-Disciplinary Care Plan Details Patient Name: Date of Service: Corey [name], [name] 01/30/2022 11:15 A M Medical Record Number: [id number] Patient Account Number: [id number] Date of Birth/Sex: Treating RN: 02/28/1946 (76 y.o. [name] Primary Care Rylei Masella: Billey Gosling Other Clinician: Referring Alasia Enge: Treating Makelle Marrone/Extender: Mickle Asper in Treatment: 12 Active Inactive Necrotic Tissue Nursing Diagnoses: Knowledge deficit related to management of necrotic/devitalized tissue Goals: Necrotic/devitalized tissue will be minimized in the wound bed Date Initiated: 11/01/2021 Target Resolution Date: 03/03/2022 Goal Status: Active Patient/caregiver will verbalize understanding of reason and process for debridement of necrotic tissue Date Initiated: 11/01/2021 Date  Inactivated: 12/05/2021 Target Resolution Date: 12/09/2021 Goal Status: Met Interventions: Assess patient pain level pre-, during and post procedure and prior to discharge Provide education on necrotic tissue and debridement process Treatment Activities: Apply topical anesthetic as ordered : 11/01/2021 Enzymatic debridement : 11/01/2021 Excisional debridement : 11/01/2021 Notes: Pain, Acute or Chronic Nursing Diagnoses: Pain, acute or chronic: actual or potential Potential alteration in comfort, pain Goals: Patient will verbalize adequate pain control and receive pain control interventions during procedures as needed Date Initiated: 11/01/2021 Date Inactivated: 11/21/2021 Target Resolution Date:  12/09/2021 Goal Status: Met Patient/caregiver will verbalize comfort level met Date Initiated: 11/01/2021 Target Resolution Date: 03/03/2022 Goal Status: Active Interventions: Complete pain assessment as per visit requirements Provide education on pain management Treatment Activities: Administer pain control measures as ordered : 11/01/2021 Notes: Electronic Signature(s) Signed: 01/30/2022 4:49:15 PM By: Deon Pilling RN, BSN Entered By: Deon Pilling on 01/30/2022 11:16:59 -------------------------------------------------------------------------------- Pain Assessment Details Patient Name: Date of Service: Corey [name], [name] 01/30/2022 11:15 A M Medical Record Number: [id number] Patient Account Number: [id number] Date of Birth/Sex: Treating RN: 08/21/1945 (76 y.o. [name] Primary Care Robbyn Hodkinson: Billey Gosling Other Clinician: Referring Avi Archuleta: Treating Shalisa Mcquade/Extender: Mickle Asper in Treatment: 12 Active Problems Location of Pain Severity and Description of Pain Patient Has Paino No Site Locations Rate the pain. Rate the pain. Current Pain Level: 0 Pain Management and Medication Current Pain Management: Medication: No Cold Application: No Rest: No Massage: No Activity: No T.E.N.S.: No Heat Application: No Leg drop or elevation: No Is the Current Pain Management Adequate: Adequate How does your wound impact your activities of daily livingo Sleep: No Bathing: No Appetite: No Relationship With Others: No Bladder Continence: No Emotions: No Bowel Continence: No Work: No Toileting: No Drive: No Dressing: No Hobbies: No Engineer, maintenance) Signed: 01/30/2022 4:49:15 PM By: Deon Pilling RN, BSN Entered By: Deon Pilling on 01/30/2022 10:58:50 -------------------------------------------------------------------------------- Patient/Caregiver Education Details Patient Name: Date of Service: Corey [name]  9/18/2023andnbsp11:15 A M Medical Record Number: [id number] Patient Account Number: [id number] Date of Birth/Gender: Treating RN: [birth year] (76 y.o. [name] Primary Care Physician: Billey Gosling Other Clinician: Referring Physician: Treating Physician/Extender: Mickle Asper in Treatment: 12 Education Assessment Education Provided To: Patient Education Topics Provided Pain: Handouts: A Guide to Pain Control Methods: Explain/Verbal Responses: Reinforcements needed Electronic Signature(s) Signed: 01/30/2022 4:49:15 PM By: Deon Pilling RN, BSN Entered By: Deon Pilling on 01/30/2022 11:17:08 -------------------------------------------------------------------------------- Wound Assessment Details Patient Name: Date of Service: Corey [name], [name] 01/30/2022 11:15 A M Medical Record Number: [id number] Patient Account Number: [id number] Date of Birth/Sex: Treating RN: 1945/11/11 (76 y.o. [name] Primary Care Jerid Catherman: Billey Gosling Other Clinician: Referring Javohn Basey: Treating Anaiza Behrens/Extender: [name] in Treatment: 12 Wound Status Wound Number: 1 Primary Diabetic Wound/Ulcer of the Lower Extremity Etiology: Wound Location: Right, Lateral Lower Leg Wound Open Wounding Event: Gradually Appeared Status: Date Acquired: 08/15/2021 Comorbid Cataracts, Sleep Apnea, Congestive Heart Failure, Hypertension, Weeks Of Treatment: 12 History: Myocardial Infarction, Type II Diabetes, Gout Clustered Wound: No Photos Wound Measurements Length: (cm) 10 Width: (cm) 6.8 Depth: (cm) 0.1 Area: (cm) 53.407 Volume: (cm) 5.341 % Reduction in Area: -147.3% % Reduction in Volume: -147.3% Epithelialization: Medium (34-66%) Tunneling: No Undermining: No Wound Description Classification: Grade 2 Wound Margin: Distinct, outline attached Exudate Amount: Medium Exudate Type: Serosanguineous Exudate Color: red, brown Foul Odor  After Cleansing: No Slough/Fibrino Yes Wound Bed Granulation Amount:  Large (67-100%) Exposed Structure Granulation Quality: Red, Pink Fascia Exposed: No Necrotic Amount: Small (1-33%) Fat Layer (Subcutaneous Tissue) Exposed: Yes Necrotic Quality: Adherent Slough Tendon Exposed: No Muscle Exposed: No Joint Exposed: No Bone Exposed: No Treatment Notes Wound #1 (Lower Leg) Wound Laterality: Right, Lateral Cleanser Soap and Water Discharge Instruction: May shower and wash wound with dial antibacterial soap and water prior to dressing change. Wound Cleanser Discharge Instruction: Cleanse the wound with wound cleanser prior to applying a clean dressing using gauze sponges, not tissue or cotton balls. Peri-Wound Care Topical collagen and blastx Discharge Instruction: apply over the topical compounding antibiotics. [facility]-topical compounding antibiotics Discharge Instruction: apply directly to wound bed. Primary Dressing Fibracol Plus Dressing, 4x4.38 in (collagen) Discharge Instruction: Moisten collagen with saline or hydrogel until the blastx and powder collagen arrive. Secondary Dressing ABD Pad, 8x10 Discharge Instruction: Apply over primary dressing as directed. Woven Gauze Sponge, Non-Sterile 4x4 in Discharge Instruction: Apply over primary dressing as directed. Zetuvit Plus 4x8 in Discharge Instruction: Apply over primary dressing as directed. Secured With Compression Wrap ThreePress (3 layer compression wrap) Discharge Instruction: Apply three layer compression as directed. Compression Stockings Add-Ons Electronic Signature(s) Signed: 01/30/2022 4:49:15 PM By: Deon Pilling RN, BSN Entered By: Deon Pilling on 01/30/2022 11:03:45 -------------------------------------------------------------------------------- Vitals Details Patient Name: Date of Service: Corey [name], [name] 01/30/2022 11:15 A M Medical Record Number: [id number] Patient Account Number:  [id number] Date of Birth/Sex: Treating RN: May 07, 1946 (76 y.o. [name] Primary Care Nyan Dufresne: Billey Gosling Other Clinician: Referring Pedram Goodchild: Treating Amaziah Ghosh/Extender: Mickle Asper in Treatment: 12 Vital Signs Time Taken: 10:55 Temperature (F): 98 Height (in): 66 Pulse (bpm): 52 Weight (lbs): 145 Respiratory Rate (breaths/min): 20 Body Mass Index (BMI): 23.4 Blood Pressure (mmHg): 161/76 Reference Range: 80 - 120 mg / dl Electronic Signature(s) Signed: 01/30/2022 4:49:15 PM By: Deon Pilling RN, BSN Entered By: Deon Pilling on 01/30/2022 10:58:42

## 2022-01-30 NOTE — Anesthesia Preprocedure Evaluation (Signed)
Anesthesia Evaluation  Patient identified by MRN, date of birth, ID band Patient awake    Reviewed: Allergy & Precautions, NPO status , Patient's Chart, lab work & pertinent test results  Airway Mallampati: III  TM Distance: >3 FB Neck ROM: Full    Dental  (+) Dental Advisory Given   Pulmonary sleep apnea , COPD, former smoker,    breath sounds clear to auscultation       Cardiovascular hypertension, Pt. on medications + CAD and + DOE   Rhythm:Regular Rate:Normal     Neuro/Psych Seizures -,  CVA    GI/Hepatic negative GI ROS, Neg liver ROS,   Endo/Other  diabetes, Type 2  Renal/GU CRFRenal disease     Musculoskeletal   Abdominal   Peds  Hematology   Anesthesia Other Findings   Reproductive/Obstetrics                            Lab Results  Component Value Date   WBC 4.2 01/05/2022   HGB 11.2 (L) 01/31/2022   HCT 33.0 (L) 01/31/2022   MCV 86.1 01/05/2022   PLT 128.0 (L) 01/05/2022   Lab Results  Component Value Date   CREATININE 3.10 (H) 01/31/2022   BUN 52 (H) 01/31/2022   NA 141 01/31/2022   K 3.8 01/31/2022   CL 112 (H) 01/31/2022   CO2 23 01/05/2022    Anesthesia Physical Anesthesia Plan  ASA: 3  Anesthesia Plan: Regional and MAC   Post-op Pain Management: Regional block*   Induction:   PONV Risk Score and Plan: 1 and Propofol infusion, Ondansetron and Treatment may vary due to age or medical condition  Airway Management Planned: Natural Airway and Simple Face Mask  Additional Equipment:   Intra-op Plan:   Post-operative Plan:   Informed Consent: I have reviewed the patients History and Physical, chart, labs and discussed the procedure including the risks, benefits and alternatives for the proposed anesthesia with the patient or authorized representative who has indicated his/her understanding and acceptance.       Plan Discussed with: CRNA  Anesthesia  Plan Comments: (PAT note written 01/30/2022 by Myra Gianotti, PA-C. )       Anesthesia Quick Evaluation

## 2022-01-30 NOTE — Progress Notes (Addendum)
PCP - [name], MD Cardiologist - Jerline Pain, MD  CPAP-Does not have a machine yet  Chest x-ray - 0824/23 EKG - 1610/96 Stress Test - 12/31/20 ECHO - 09/30/20 Cardiac Cath - 09/20/20  Fasting Blood Sugar - 104-110 Checks Blood Sugar 2/day  Blood Thinner Instructions: Eliquis last dose 01/27/22  ERAS Protocol - NPO  Anesthesia review: Y  Patient verbally denies any shortness of breath, fever, cough and chest pain during phone call   -------------  SDW INSTRUCTIONS given:  Your procedure is scheduled on 01/31/22.  Report to [facility] Main Entrance "A" at [place] and check in at the Admitting office.  Call this number if you have problems the morning of surgery:  [phone]   Remember:  Do not eat or drink after midnight the night before your surgery    Take these medicines the morning of surgery with A SIP OF WATER  allopurinol (ZYLOPRIM) hydrALAZINE (APRESOLINE)  rosuvastatin (CRESTOR) nitroGLYCERIN (NITROSTAT)-if needed albuterol (VENTOLIN HFA)-if needed (Please bring on day of surgery)  As of today, STOP taking any Aspirin (unless otherwise instructed by your surgeon) Aleve, Naproxen, Ibuprofen, Motrin, Advil, Goody's, BC's, all herbal medications, fish oil, and all vitamins.                      Do not wear jewelry, make up, or nail polish            Do not wear lotions, powders, perfumes/colognes, or deodorant.            Do not shave 48 hours prior to surgery.  Men may shave face and neck.            Do not bring valuables to the hospital.            [facility] is not responsible for any belongings or valuables.  Do NOT Smoke (Tobacco/Vaping) 24 hours prior to your procedure If you use a CPAP at night, you may bring all equipment for your overnight stay.   Contacts, glasses, dentures or bridgework may not be worn into surgery.      For patients admitted to the hospital, discharge time will be determined by your treatment team.   Patients  discharged the day of surgery will not be allowed to drive home, and someone needs to stay with them for 24 hours.    Special instructions:   [place]- Preparing For Surgery  Before surgery, you can play an important role. Because skin is not sterile, your skin needs to be as free of germs as possible. You can reduce the number of germs on your skin by washing with CHG (chlorahexidine gluconate) Soap before surgery.  CHG is an antiseptic cleaner which kills germs and bonds with the skin to continue killing germs even after washing.    Oral Hygiene is also important to reduce your risk of infection.  Remember - BRUSH YOUR TEETH THE MORNING OF SURGERY WITH YOUR REGULAR TOOTHPASTE  Please do not use if you have an allergy to CHG or antibacterial soaps. If your skin becomes reddened/irritated stop using the CHG.  Do not shave (including legs and underarms) for at least 48 hours prior to first CHG shower. It is OK to shave your face.  Please follow these instructions carefully.   Shower the NIGHT BEFORE SURGERY and the MORNING OF SURGERY with DIAL Soap.   Pat yourself dry with a CLEAN TOWEL.  Wear CLEAN PAJAMAS to bed the night  before surgery  Place CLEAN SHEETS on your bed the night of your first shower and DO NOT SLEEP WITH PETS.   Day of Surgery: Please shower morning of surgery  Wear Clean/Comfortable clothing the morning of surgery Do not apply any deodorants/lotions.   Remember to brush your teeth WITH YOUR REGULAR TOOTHPASTE.   Questions were answered. Patient verbalized understanding of instructions.

## 2022-01-31 ENCOUNTER — Ambulatory Visit (HOSPITAL_BASED_OUTPATIENT_CLINIC_OR_DEPARTMENT_OTHER): Payer: Medicare HMO | Admitting: Vascular Surgery

## 2022-01-31 ENCOUNTER — Other Ambulatory Visit: Payer: Self-pay

## 2022-01-31 ENCOUNTER — Encounter (HOSPITAL_COMMUNITY): Payer: Self-pay | Admitting: Vascular Surgery

## 2022-01-31 ENCOUNTER — Ambulatory Visit (HOSPITAL_COMMUNITY)
Admission: RE | Admit: 2022-01-31 | Discharge: 2022-01-31 | Disposition: A | Discharge status: Home / Self Care | Payer: Medicare HMO | Attending: Vascular Surgery | Admitting: Vascular Surgery

## 2022-01-31 ENCOUNTER — Ambulatory Visit (HOSPITAL_COMMUNITY): Payer: Medicare HMO | Admitting: Vascular Surgery

## 2022-01-31 ENCOUNTER — Encounter (HOSPITAL_COMMUNITY)
Admission: RE | Disposition: A | Discharge status: Home / Self Care | Payer: Self-pay | Source: Home / Self Care | Attending: Vascular Surgery

## 2022-01-31 DIAGNOSIS — J449 Chronic obstructive pulmonary disease, unspecified: Secondary | ICD-10-CM | POA: Diagnosis not present

## 2022-01-31 DIAGNOSIS — I120 Hypertensive chronic kidney disease with stage 5 chronic kidney disease or end stage renal disease: Secondary | ICD-10-CM | POA: Insufficient documentation

## 2022-01-31 DIAGNOSIS — I129 Hypertensive chronic kidney disease with stage 1 through stage 4 chronic kidney disease, or unspecified chronic kidney disease: Secondary | ICD-10-CM

## 2022-01-31 DIAGNOSIS — Z87891 Personal history of nicotine dependence: Secondary | ICD-10-CM | POA: Insufficient documentation

## 2022-01-31 DIAGNOSIS — I770 Arteriovenous fistula, acquired: Secondary | ICD-10-CM | POA: Insufficient documentation

## 2022-01-31 DIAGNOSIS — E1122 Type 2 diabetes mellitus with diabetic chronic kidney disease: Secondary | ICD-10-CM

## 2022-01-31 DIAGNOSIS — R0600 Dyspnea, unspecified: Secondary | ICD-10-CM | POA: Insufficient documentation

## 2022-01-31 DIAGNOSIS — I5042 Chronic combined systolic (congestive) and diastolic (congestive) heart failure: Secondary | ICD-10-CM | POA: Diagnosis not present

## 2022-01-31 DIAGNOSIS — N186 End stage renal disease: Secondary | ICD-10-CM | POA: Diagnosis not present

## 2022-01-31 DIAGNOSIS — N184 Chronic kidney disease, stage 4 (severe): Secondary | ICD-10-CM | POA: Diagnosis not present

## 2022-01-31 DIAGNOSIS — N185 Chronic kidney disease, stage 5: Secondary | ICD-10-CM

## 2022-01-31 DIAGNOSIS — Z7901 Long term (current) use of anticoagulants: Secondary | ICD-10-CM | POA: Insufficient documentation

## 2022-01-31 DIAGNOSIS — I2510 Atherosclerotic heart disease of native coronary artery without angina pectoris: Secondary | ICD-10-CM | POA: Insufficient documentation

## 2022-01-31 DIAGNOSIS — I132 Hypertensive heart and chronic kidney disease with heart failure and with stage 5 chronic kidney disease, or end stage renal disease: Secondary | ICD-10-CM | POA: Diagnosis not present

## 2022-01-31 DIAGNOSIS — I4891 Unspecified atrial fibrillation: Secondary | ICD-10-CM | POA: Diagnosis not present

## 2022-01-31 DIAGNOSIS — Z7984 Long term (current) use of oral hypoglycemic drugs: Secondary | ICD-10-CM

## 2022-01-31 HISTORY — DX: Organ-limited amyloidosis: I43

## 2022-01-31 HISTORY — DX: Organ-limited amyloidosis: E85.4

## 2022-01-31 HISTORY — DX: Atherosclerotic heart disease of native coronary artery without angina pectoris: I25.10

## 2022-01-31 HISTORY — PX: AV FISTULA PLACEMENT: SHX1204

## 2022-01-31 LAB — POCT I-STAT, CHEM 8
BUN: 52 mg/dL — ABNORMAL HIGH (ref 8–23)
Calcium, Ion: 1.13 mmol/L — ABNORMAL LOW (ref 1.15–1.40)
Chloride: 112 mmol/L — ABNORMAL HIGH (ref 98–111)
Creatinine, Ser: 3.1 mg/dL — ABNORMAL HIGH (ref 0.61–1.24)
Glucose, Bld: 89 mg/dL (ref 70–99)
HCT: 33 % — ABNORMAL LOW (ref 39.0–52.0)
Hemoglobin: 11.2 g/dL — ABNORMAL LOW (ref 13.0–17.0)
Potassium: 3.8 mmol/L (ref 3.5–5.1)
Sodium: 141 mmol/L (ref 135–145)
TCO2: 22 mmol/L (ref 22–32)

## 2022-01-31 LAB — GLUCOSE, CAPILLARY
Glucose-Capillary: 87 mg/dL (ref 70–99)
Glucose-Capillary: 87 mg/dL (ref 70–99)
Glucose-Capillary: 88 mg/dL (ref 70–99)

## 2022-01-31 SURGERY — ARTERIOVENOUS (AV) FISTULA CREATION
Anesthesia: Monitor Anesthesia Care | Site: Arm Upper | Laterality: Left

## 2022-01-31 MED ORDER — CHLORHEXIDINE GLUCONATE 4 % EX LIQD: 60.0000 mL | Freq: Once | CUTANEOUS | Status: DC

## 2022-01-31 MED ORDER — FENTANYL CITRATE (PF) 100 MCG/2ML IJ SOLN: 25.0000 ug | INTRAMUSCULAR | Status: DC | PRN

## 2022-01-31 MED ORDER — CHLORHEXIDINE GLUCONATE 0.12 % MT SOLN
15.0000 mL | Freq: Once | OROMUCOSAL | Status: AC
  Administered 2022-01-31: 15 mL via OROMUCOSAL
  Filled 2022-01-31: qty 15

## 2022-01-31 MED ORDER — ORAL CARE MOUTH RINSE: 15.0000 mL | Freq: Once | OROMUCOSAL | Status: AC

## 2022-01-31 MED ORDER — FENTANYL CITRATE (PF) 100 MCG/2ML IJ SOLN
INTRAMUSCULAR | Status: AC
  Administered 2022-01-31: 50 ug via INTRAVENOUS
  Filled 2022-01-31: qty 2

## 2022-01-31 MED ORDER — FENTANYL CITRATE (PF) 100 MCG/2ML IJ SOLN: 50.0000 ug | Freq: Once | INTRAMUSCULAR | Status: AC

## 2022-01-31 MED ORDER — ONDANSETRON HCL 4 MG/2ML IJ SOLN
INTRAMUSCULAR | Status: DC | PRN
  Administered 2022-01-31: 4 mg via INTRAVENOUS

## 2022-01-31 MED ORDER — PROPOFOL 500 MG/50ML IV EMUL
INTRAVENOUS | Status: DC | PRN
  Administered 2022-01-31: 75 ug/kg/min via INTRAVENOUS

## 2022-01-31 MED ORDER — HEPARIN 6000 UNIT IRRIGATION SOLUTION
Status: AC
  Filled 2022-01-31: qty 500

## 2022-01-31 MED ORDER — INSULIN ASPART 100 UNIT/ML IJ SOLN: 0.0000 [IU] | INTRAMUSCULAR | Status: DC | PRN

## 2022-01-31 MED ORDER — ROPIVACAINE HCL 5 MG/ML IJ SOLN
INTRAMUSCULAR | Status: DC | PRN
  Administered 2022-01-31: 30 mL via PERINEURAL

## 2022-01-31 MED ORDER — SODIUM CHLORIDE 0.9 % IV SOLN: INTRAVENOUS | Status: DC

## 2022-01-31 MED ORDER — ALBUTEROL SULFATE HFA 108 (90 BASE) MCG/ACT IN AERS
INHALATION_SPRAY | RESPIRATORY_TRACT | Status: DC | PRN
  Administered 2022-01-31: 2 via RESPIRATORY_TRACT

## 2022-01-31 MED ORDER — MIDAZOLAM HCL 2 MG/2ML IJ SOLN
INTRAMUSCULAR | Status: AC
  Filled 2022-01-31: qty 2

## 2022-01-31 MED ORDER — PHENYLEPHRINE HCL-NACL 20-0.9 MG/250ML-% IV SOLN
INTRAVENOUS | Status: DC | PRN
  Administered 2022-01-31: 20 ug/min via INTRAVENOUS

## 2022-01-31 MED ORDER — LIDOCAINE-EPINEPHRINE (PF) 1 %-1:200000 IJ SOLN
INTRAMUSCULAR | Status: AC
  Filled 2022-01-31: qty 30

## 2022-01-31 MED ORDER — HEPARIN 6000 UNIT IRRIGATION SOLUTION
Status: DC | PRN
  Administered 2022-01-31: 1

## 2022-01-31 MED ORDER — CEFAZOLIN SODIUM-DEXTROSE 2-4 GM/100ML-% IV SOLN
2.0000 g | INTRAVENOUS | Status: DC
  Filled 2022-01-31: qty 100

## 2022-01-31 MED ORDER — OXYCODONE-ACETAMINOPHEN 5-325 MG PO TABS: 1.0000 | ORAL_TABLET | ORAL | 0 refills | Status: DC | PRN

## 2022-01-31 MED ORDER — 0.9 % SODIUM CHLORIDE (POUR BTL) OPTIME
TOPICAL | Status: DC | PRN
  Administered 2022-01-31: 1000 mL

## 2022-01-31 SURGICAL SUPPLY — 35 items
ADH SKN CLS APL DERMABOND .7 (GAUZE/BANDAGES/DRESSINGS) ×1
ARMBAND PINK RESTRICT EXTREMIT (MISCELLANEOUS) ×2 IMPLANT
BAG COUNTER SPONGE SURGICOUNT (BAG) ×2 IMPLANT
BAG SPNG CNTER NS LX DISP (BAG) ×1
CANISTER SUCT 3000ML PPV (MISCELLANEOUS) ×2 IMPLANT
CLIP LIGATING EXTRA MED SLVR (CLIP) ×2 IMPLANT
CLIP LIGATING EXTRA SM BLUE (MISCELLANEOUS) ×2 IMPLANT
COVER PROBE W GEL 5X96 (DRAPES) IMPLANT
DERMABOND ADVANCED .7 DNX12 (GAUZE/BANDAGES/DRESSINGS) ×2 IMPLANT
ELECT REM PT RETURN 9FT ADLT (ELECTROSURGICAL) ×1
ELECTRODE REM PT RTRN 9FT ADLT (ELECTROSURGICAL) ×2 IMPLANT
GLOVE BIO SURGEON STRL SZ7 (GLOVE) IMPLANT
GLOVE BIO SURGEON STRL SZ7.5 (GLOVE) ×2 IMPLANT
GLOVE BIO SURGEON STRL SZ8.5 (GLOVE) IMPLANT
GLOVE BIOGEL PI IND STRL 8 (GLOVE) IMPLANT
GOWN STRL REUS W/ TWL LRG LVL3 (GOWN DISPOSABLE) ×4 IMPLANT
GOWN STRL REUS W/ TWL XL LVL3 (GOWN DISPOSABLE) ×2 IMPLANT
GOWN STRL REUS W/TWL LRG LVL3 (GOWN DISPOSABLE) ×2
GOWN STRL REUS W/TWL XL LVL3 (GOWN DISPOSABLE) ×2
INSERT FOGARTY SM (MISCELLANEOUS) IMPLANT
KIT BASIN OR (CUSTOM PROCEDURE TRAY) ×2 IMPLANT
KIT TURNOVER KIT B (KITS) ×2 IMPLANT
NS IRRIG 1000ML POUR BTL (IV SOLUTION) ×2 IMPLANT
PACK CV ACCESS (CUSTOM PROCEDURE TRAY) ×2 IMPLANT
PAD ARMBOARD 7.5X6 YLW CONV (MISCELLANEOUS) ×4 IMPLANT
SHEATH PROBE COVER 6X72 (BAG) IMPLANT
SLING ARM FOAM STRAP LRG (SOFTGOODS) IMPLANT
SLING ARM FOAM STRAP MED (SOFTGOODS) IMPLANT
SUT MNCRL AB 4-0 PS2 18 (SUTURE) ×2 IMPLANT
SUT PROLENE 6 0 BV (SUTURE) ×2 IMPLANT
SUT VIC AB 3-0 SH 27 (SUTURE) ×2
SUT VIC AB 3-0 SH 27X BRD (SUTURE) ×2 IMPLANT
TOWEL GREEN STERILE (TOWEL DISPOSABLE) ×2 IMPLANT
UNDERPAD 30X36 HEAVY ABSORB (UNDERPADS AND DIAPERS) ×2 IMPLANT
WATER STERILE IRR 1000ML POUR (IV SOLUTION) ×2 IMPLANT

## 2022-01-31 NOTE — Discharge Instructions (Signed)
[facility]  Discharge Instructions  AV Fistula or Graft Surgery for Dialysis Access  Please refer to the following instructions for your post-procedure care. Your surgeon or physician assistant will discuss any changes with you.  Activity  You may drive the day following your surgery, if you are comfortable and no longer taking prescription pain medication. Resume full activity as the soreness in your incision resolves.  Bathing/Showering  You may shower after you go home. Keep your incision dry for 48 hours. Do not soak in a bathtub, hot tub, or swim until the incision heals completely. You may not shower if you have a hemodialysis catheter.  Incision Care  Clean your incision with mild soap and water after 48 hours. Pat the area dry with a clean towel. You do not need a bandage unless otherwise instructed. Do not apply any ointments or creams to your incision. You may have skin glue on your incision. Do not peel it off. It will come off on its own in about one week. Your arm may swell a bit after surgery. To reduce swelling use pillows to elevate your arm so it is above your heart. Your doctor will tell you if you need to lightly wrap your arm with an ACE bandage.  Diet  Resume your normal diet. There are not special food restrictions following this procedure. In order to heal from your surgery, it is CRITICAL to get adequate nutrition. Your body requires vitamins, minerals, and protein. Vegetables are the best source of vitamins and minerals. Vegetables also provide the perfect balance of protein. Processed food has little nutritional value, so try to avoid this.  Medications  Resume taking all of your medications. If your incision is causing pain, you may take over-the counter pain relievers such as acetaminophen (Tylenol). If you were prescribed a stronger pain medication, please be aware these medications can cause nausea and constipation. Prevent  nausea by taking the medication with a snack or meal. Avoid constipation by drinking plenty of fluids and eating foods with high amount of fiber, such as fruits, vegetables, and grains.  Do not take Tylenol if you are taking prescription pain medications.  Follow up Your surgeon may want to see you in the office following your access surgery. If so, this will be arranged at the time of your surgery.  Please call us immediately for any of the following conditions:  Increased pain, redness, drainage (pus) from your incision site Fever of 101 degrees or higher Severe or worsening pain at your incision site Hand pain or numbness.  Reduce your risk of vascular disease:  Stop smoking. If you would like help, call [facility] at 1-800-QUIT-NOW (769)683-6489) or [place] at [facility] your cholesterol Maintain a desired weight Control your diabetes Keep your blood pressure down  Dialysis  It will take several weeks to several months for your new dialysis access to be ready for use. Your surgeon will determine when it is okay to use it. Your nephrologist will continue to direct your dialysis. You can continue to use your Permcath until your new access is ready for use.   01/31/2022 AREN PRYDE [id number] Jun 19, 1945  Surgeon(s): Waynetta Sandy, MD  Procedure(s): LEFT ARM ARTERIOVENOUS (AV) FISTULA CREATION   May stick graft immediately   May stick graft on designated area only:   x Do not stick fistula for 12 weeks    If you have any questions, please call the office at [phone].

## 2022-01-31 NOTE — Anesthesia Procedure Notes (Signed)
Anesthesia Regional Block: Axillary brachial plexus block   Pre-Anesthetic Checklist: , timeout performed,  Correct Patient, Correct Site, Correct Laterality,  Correct Procedure, Correct Position, site marked,  Risks and benefits discussed,  Surgical consent,  Pre-op evaluation,  At surgeon's request and post-op pain management  Laterality: Left  Prep: chloraprep       Needles:  Injection technique: Single-shot  Needle Type: Echogenic Stimulator Needle     Needle Length: 9cm  Needle Gauge: 21     Additional Needles:   Procedures:, nerve stimulator,,, ultrasound used (permanent image in chart),,     Nerve Stimulator or Paresthesia:  Response: MC, median, radial, ulnar responses elicited, 0.5 mA  Additional Responses:   Narrative:  Start time: 01/31/2022 8:55 AM End time: 01/31/2022 9:02 AM Injection made incrementally with aspirations every 5 mL.  Performed by: Personally  Anesthesiologist: Suzette Battiest, MD

## 2022-01-31 NOTE — Interval H&P Note (Signed)
History and Physical Interval Note:  01/31/2022 7:19 AM  Corey [name]  has presented today for surgery, with the diagnosis of CKD V.  The various methods of treatment have been discussed with the patient and family. After consideration of risks, benefits and other options for treatment, the patient has consented to  Procedure(s): LEFT ARM ARTERIOVENOUS (AV) FISTULA VERSUS ARTERIOVENOUS GRAFT CREATION (Left) as a surgical intervention.  The patient's history has been reviewed, patient examined, no change in status, stable for surgery.  I have reviewed the patient's chart and labs.  Questions were answered to the patient's satisfaction.     Servando Snare

## 2022-01-31 NOTE — Transfer of Care (Signed)
Immediate Anesthesia Transfer of Care Note  Patient: Corey [name]  Procedure(s) Performed: LEFT ARM ARTERIOVENOUS (AV) FISTULA CREATION (Left: Arm Upper)  Patient Location: PACU  Anesthesia Type:MAC combined with regional for post-op pain  Level of Consciousness: awake, alert  and oriented  Airway & Oxygen Therapy: Patient Spontanous Breathing  Post-op Assessment: Report given to RN and Post -op Vital signs reviewed and stable  Post vital signs: Reviewed and stable  Last Vitals:  Vitals Value Taken Time  BP 154/88 01/31/22 1053  Temp    Pulse 52 01/31/22 1054  Resp 19 01/31/22 1054  SpO2 93 % 01/31/22 1054  Vitals shown include unvalidated device data.  Last Pain:  Vitals:   01/31/22 0909  TempSrc:   PainSc: 0-No pain         Complications: No notable events documented.

## 2022-01-31 NOTE — Anesthesia Postprocedure Evaluation (Signed)
Anesthesia Post Note  Patient: Corey [name]  Procedure(s) Performed: LEFT ARM ARTERIOVENOUS (AV) FISTULA CREATION (Left: Arm Upper)     Patient location during evaluation: PACU Anesthesia Type: Regional Level of consciousness: awake and alert Pain management: pain level controlled Vital Signs Assessment: post-procedure vital signs reviewed and stable Respiratory status: spontaneous breathing, nonlabored ventilation, respiratory function stable and patient connected to nasal cannula oxygen Cardiovascular status: stable and blood pressure returned to baseline Postop Assessment: no apparent nausea or vomiting Anesthetic complications: no   No notable events documented.  Last Vitals:  Vitals:   01/31/22 1100 01/31/22 1115  BP: (!) 159/73 (!) 169/72  Pulse: (!) 51 (!) 55  Resp: 20 20  Temp:  36.4 C  SpO2: 97% 99%    Last Pain:  Vitals:   01/31/22 1115  TempSrc:   PainSc: 0-No pain                 Tiajuana Amass

## 2022-01-31 NOTE — Op Note (Addendum)
    Patient name: Corey [name] MRN: [id number] DOB: 22-Jun-1945 Sex: male  01/31/2022 Pre-operative Diagnosis: Chronic kidney disease stage IV Post-operative diagnosis:  Same Surgeon:  Eda Paschal. Donzetta Matters, MD Assistants: Vicente Serene, PA; Dimitry Shitarev, MS3 Procedure Performed:  Left brachial artery to cephalic vein AV fistula creation   Indications: 76 year old male with chronic kidney disease now indicated for permanent dialysis access.  He appears to have marginal vein on preoperative duplex but by physical exam appears to have both a suitable cephalic and basilic vein at the antecubitum.  Findings: Cephalic vein measured approximately 5 mm at the antecubitum was smaller higher up but was very superficial.  The brachial artery was very large measuring 5 mm at the antecubitum.  After completion was a very strong thrill and was patient with Doppler throughout the upper arm and there was a palpable radial artery pulse confirmed with Doppler at the wrist.   Procedure:  The patient was identified in the holding area and taken to the operating room was put supine on the operative table and MAC anesthesia was induced.  He was sterilely prepped and draped in the usual fashion, antibiotics were minister timeout was called.  A preoperative block of been placed this was tested and noted to be intact.  Ultrasound was used to identify what appeared to be a very suitable cephalic vein near the antecubital and traced throughout the upper arm.  There was also a very large brachial artery at the antecubital.  A transverse incision was created.  We dissected out the vein marked for orientation.  The brachial artery was dissected free from the deep fascia and encircled with vessel loop.  The vein was then transected distally tied off flush with heparinized saline dilated and spatulated.  The artery was clamped distally proximally opened longitudinally flushed with heparinized saline and distal direction only given its  large size.  The vein was then sewn into side with 6-0 Prolene suture.  Prior completion without flushing in all directions.  Upon completion there was very strong thrill in the fistula confirmed with Doppler throughout the upper arm and a palpable radial pulse at the wrist also confirmed with Doppler.  Satisfied with this we irrigated the wound obtain hemostasis and closed in layers with Vicryl and Monocryl.  Dermabond was placed at the skin level.  He was awakened from anesthesia having tolerated procedure without immediate complication.  All counts were correct at completion.  Given the complexity of the case,  the assistant was necessary in order to expedient the procedure and safely perform the technical aspects of the operation.  The assistant provided traction and countertraction to assist with exposure of the artery and vein.  They also assisted with suture ligation of multiple venous branches.  They played a critical role in the anastomosis. These skills, especially following the Prolene suture for the anastomosis, could not have been adequately performed by a scrub tech assistant.   EBL: 20 cc   Zidane Renner C. Donzetta Matters, MD [facility] [place] Office: [phone] Pager: [phone]

## 2022-02-01 ENCOUNTER — Encounter (HOSPITAL_COMMUNITY): Payer: Self-pay | Admitting: Vascular Surgery

## 2022-02-02 ENCOUNTER — Other Ambulatory Visit: Payer: Self-pay | Admitting: Endocrinology

## 2022-02-02 ENCOUNTER — Ambulatory Visit (INDEPENDENT_AMBULATORY_CARE_PROVIDER_SITE_OTHER): Payer: Medicare HMO

## 2022-02-02 VITALS — Ht 66.0 in | Wt 162.0 lb

## 2022-02-02 DIAGNOSIS — Z0000 Encounter for general adult medical examination without abnormal findings: Secondary | ICD-10-CM

## 2022-02-02 DIAGNOSIS — Z135 Encounter for screening for eye and ear disorders: Secondary | ICD-10-CM

## 2022-02-02 DIAGNOSIS — E1165 Type 2 diabetes mellitus with hyperglycemia: Secondary | ICD-10-CM

## 2022-02-02 NOTE — Progress Notes (Signed)
Virtual Visit via Telephone Note  I connected with  Corey [name] on 02/02/22 at  3:00 PM EDT by telephone and verified that I am speaking with the correct person using two identifiers.  Location: Patient: Home Provider: Patient Persons participating in the virtual visit: patient/Nurse Health Advisor   I discussed the limitations, risks, security and privacy concerns of performing an evaluation and management service by telephone and the availability of in person appointments. The patient expressed understanding and agreed to proceed.  Interactive audio and video telecommunications were attempted between this nurse and patient, however failed, due to patient having technical difficulties OR patient did not have access to video capability.  We continued and completed visit with audio only.  Some vital signs may be absent or patient reported.   Sheral Flow, LPN  Subjective:   Corey [name] is a 76 y.o. male who presents for Medicare Annual/Subsequent preventive examination.  Review of Systems     Cardiac Risk Factors include: advanced age (>70mn, >>5women);diabetes mellitus;hypertension;sedentary lifestyle;male gender;dyslipidemia;family history of premature cardiovascular disease     Objective:    Today's Vitals   02/02/22 1504  Weight: 162 lb (73.5 kg)  Height: '5\' 6"'$  (1.676 m)  PainSc: 0-No pain   Body mass index is 26.15 kg/m.     02/02/2022    3:05 PM 06/10/2021   11:21 AM 06/06/2021    8:18 PM 11/25/2020    1:30 PM 09/20/2020    8:16 AM 06/12/2019    9:51 AM 06/05/2019    4:49 AM  Advanced Directives  Does Patient Have a Medical Advance Directive? No No No No No No No  Would patient like information on creating a medical advance directive? No - Patient declined No - Patient declined No - Patient declined Yes (MAU/Ambulatory/Procedural Areas - Information given) Yes (MAU/Ambulatory/Procedural Areas - Information given) No - Patient declined No - Patient declined     Current Medications (verified) Outpatient Encounter Medications as of 02/02/2022  Medication Sig   ACCU-CHEK AVIVA PLUS test strip TEST BLOOD SUGAR TWICE DAILY AS DIRECTED   ACCU-CHEK AVIVA PLUS test strip Use as instructed   albuterol (VENTOLIN HFA) 108 (90 Base) MCG/ACT inhaler Inhale 2 puffs into the lungs every 4 (four) hours as needed for wheezing or shortness of breath.   allopurinol (ZYLOPRIM) 100 MG tablet TAKE 1/2 TABLET ONE TIME DAILY   apixaban (ELIQUIS) 5 MG TABS tablet Take 1 tablet (5 mg total) by mouth 2 (two) times daily.   bismuth subsalicylate (PEPTO BISMOL) 262 MG/15ML suspension Take 30 mLs by mouth every 6 (six) hours as needed for indigestion.   colchicine (COLCRYS) 0.6 MG tablet Take 2 tabs once and then one tab one hour later as needed for gout   ergocalciferol (VITAMIN D2) 1.25 MG (50000 UT) capsule Take 50,000 Units by mouth once a week.   glipiZIDE (GLUCOTROL XL) 2.5 MG 24 hr tablet Take 1 tablet (2.5 mg total) by mouth daily with breakfast. (Patient not taking: Reported on 01/26/2022)   hydrALAZINE (APRESOLINE) 50 MG tablet Take 2 tablets (100 mg total) by mouth 3 (three) times daily.   JANUVIA 25 MG tablet Take 1 tablet by mouth once daily   nitroGLYCERIN (NITROSTAT) 0.4 MG SL tablet Place 0.4 mg under the tongue every 5 (five) minutes as needed for chest pain.   oxyCODONE-acetaminophen (PERCOCET) 5-325 MG tablet Take 1 tablet by mouth every 4 (four) hours as needed for severe pain.   rosuvastatin (CRESTOR) 40 MG  tablet Take 1 tablet (40 mg total) by mouth daily.   Tafamidis (VYNDAMAX) 61 MG CAPS Take 61 mg by mouth daily.   torsemide (DEMADEX) 20 MG tablet Take 1 tablet (20 mg total) by mouth daily. (Patient taking differently: Take 20 mg by mouth every Monday, Wednesday, and Friday.)   TRUEplus Lancets 33G MISC USE 4 (FOUR) TIMES DAILY   No facility-administered encounter medications on file as of 02/02/2022.    Allergies (verified) Hydrochlorothiazide    History: Past Medical History:  Diagnosis Date   Cataract    CHF (congestive heart failure) (HCC)    Chronic heart failure with preserved ejection fraction (HFpEF) (HCC)    Colon polyp 2003   Dr [name]; F/U was to be 2008( not completed)   Coronary artery disease    CVA (cerebral infarction) 2014   Diabetes mellitus    Diverticulosis 2003   Dyspnea    with exertion   Gout    Hereditary cardiac amyloidosis (HCC)    Hypertension    Myocardial infarction ([place]) 09/19/2012   PEA cardiac arrest in setting of acute respiratory failure/pulmonary edema   OSA (obstructive sleep apnea) 03/27/2018   Pneumonia 09/29/2011   Avelox X 10 days as OP   Prostate cancer ([place])    Renal insufficiency    Seizures ([place]) 09/19/2012   not treated for seizure disorder; had a seizure after stroke 2014; no seizure since then   Stroke [facility])    Past Surgical History:  Procedure Laterality Date   AV FISTULA PLACEMENT Left 01/31/2022   Procedure: LEFT ARM ARTERIOVENOUS (AV) FISTULA CREATION;  Surgeon: Waynetta Sandy, MD;  Location: [place];  Service: Vascular;  Laterality: Left;  regional block to left arm   COLONOSCOPY W/ POLYPECTOMY  2003   no F/U ([place] discussed 12/03/12)   INGUINAL HERNIA REPAIR Left 06/16/2021   Procedure: LAPAROSCOPIC, POSSIBLY OPEN LEFT INGUINAL HERNIA REPAIR WITH MESH;  Surgeon: Felicie Morn, MD;  Location: [facility];  Service: General;  Laterality: Left;   INSERTION OF MESH N/A 07/16/2017   Procedure: INSERTION OF MESH;  Surgeon: [name], MD;  Location: [place] OR;  Service: General;  Laterality: N/A;   PEG PLACEMENT  2014   10/08/12-12/10/12   PEG TUBE REMOVAL  2014   PROSTATE BIOPSY     RIGHT/LEFT HEART CATH AND CORONARY ANGIOGRAPHY N/A 09/20/2020   Procedure: RIGHT/LEFT HEART CATH AND CORONARY ANGIOGRAPHY;  Surgeon: Leonie Man, MD;  Location: [place] CV LAB;  Service: Cardiovascular;  Laterality: N/A;   TEE WITHOUT CARDIOVERSION N/A 09/27/2020    Procedure: TRANSESOPHAGEAL ECHOCARDIOGRAM (TEE);  Surgeon: Wonda Olds, MD;  [facility] OR;  Service: Thoracic;  Laterality: N/A;   TRACHEOSTOMY  2014   09/30/12-10/20/12   VENTRAL HERNIA REPAIR N/A 07/16/2017   Procedure: LAPAROSCOPIC VENTRAL HERNIA REPAIR WITH MESH;  Surgeon: [name], MD;  Location: [place];  Service: General;  Laterality: N/A;   wrist aspiration  02/16/2012    monosodium urate crystals; Dr Caralyn Guile   Family History  Problem Relation Age of Onset   Heart failure Mother    Hypertension Mother    Diabetes Mother    Hypertension Father    Prostate cancer Father    Diabetes Father    Cancer Father    Heart failure Brother    Stroke Neg Hx    Heart disease Neg Hx    Colon cancer Neg Hx    Social History   Socioeconomic History  Marital status: Married    Spouse name: [name]   Number of children: 2   Years of education: 12   Highest education level: 12th grade  Occupational History   Not on file  Tobacco Use   Smoking status: Former    Packs/day: 0.25    Years: 1.00    Total pack years: 0.25    Types: Cigarettes    Quit date: 05/15/1968    Years since quitting: 53.7   Smokeless tobacco: Never   Tobacco comments:    smoked [place], up to 1-2 cigarettes/ day  Vaping Use   Vaping Use: Never used  Substance and Sexual Activity   Alcohol use: No    Alcohol/week: 0.0 standard drinks of alcohol   Drug use: No   Sexual activity: Yes  Other Topics Concern   Not on file  Social History Narrative   Walks three times a week, 2-3 miles, occasional walks on treadmill       Patient is right-handed. He lives with his wife in a one level home. He drinks 1 diet Mt. Dew a day and rarely has coffee. He walks 3 x a week for exercise.   Social Determinants of Health   Financial Resource Strain: Low Risk  (02/02/2022)   Overall Financial Resource Strain (CARDIA)    Difficulty of Paying Living Expenses: Not hard at all  Food Insecurity: No Food  Insecurity (02/02/2022)   Hunger Vital Sign    Worried About Running Out of Food in the Last Year: Never true    Ran Out of Food in the Last Year: Never true  Transportation Needs: No Transportation Needs (02/02/2022)   PRAPARE - Hydrologist (Medical): No    Lack of Transportation (Non-Medical): No  Physical Activity: Inactive (02/02/2022)   Exercise Vital Sign    Days of Exercise per Week: 0 days    Minutes of Exercise per Session: 0 min  Stress: No Stress Concern Present (02/02/2022)   [place]    Feeling of Stress : Not at all  Social Connections: [place] (02/02/2022)   Social Connection and Isolation Panel [NHANES]    Frequency of Communication with Friends and Family: More than three times a week    Frequency of Social Gatherings with Friends and Family: More than three times a week    Attends Religious Services: More than 4 times per year    Active Member of [employer] or Organizations: Yes    Attends Music therapist: More than 4 times per year    Marital Status: Married    Tobacco Counseling Counseling given: Not Answered Tobacco comments: smoked [place], up to 1-2 cigarettes/ day   Clinical Intake:  Pre-visit preparation completed: Yes  Pain : No/denies pain Pain Score: 0-No pain     BMI - recorded: 26.15 Nutritional Risks: None Diabetes: No  How often do you need to have someone help you when you read instructions, pamphlets, or other written materials from your doctor or pharmacy?: 1 - Never What is the last grade level you completed in school?: HSG  Nutrition Risk Assessment:  Has the patient had any N/V/D within the last 2 months?  No  Does the patient have any non-healing wounds?  Yes -lower leg Has the patient had any unintentional weight loss or weight gain?  No   Diabetes:  Is the patient diabetic?  Yes  If diabetic, was a CBG  obtained today?  No  Did the patient bring in their glucometer from home?  No  How often do you monitor your CBG's? Twice a day.   Financial Strains and Diabetes Management:  Are you having any financial strains with the device, your supplies or your medication? No .  Does the patient want to be seen by Chronic Care Management for management of their diabetes?  No  Would the patient like to be referred to a Nutritionist or for Diabetic Management?  No   Diabetic Exams:  Diabetic Eye Exam: Overdue for diabetic eye exam. Pt has been advised about the importance in completing this exam. Patient advised to call and schedule an eye exam. Diabetic Foot Exam: Completed 04/27/2021   Interpreter Needed?: No  Information entered by :: Lisette Abu, LPN.   Activities of Daily Living    02/02/2022    3:19 PM 01/31/2022    7:15 AM  In your present state of health, do you have any difficulty performing the following activities:  Hearing? 1 1  Comment hearing aids   Vision? 0 0  Comment not up to date with eye exam   Difficulty concentrating or making decisions? 0 0  Walking or climbing stairs? 0 0  Dressing or bathing? 0 0  Doing errands, shopping? 0   Preparing Food and eating ? N   Using the Toilet? N   In the past six months, have you accidently leaked urine? N   Do you have problems with loss of bowel control? N   Managing your Medications? N   Managing your Finances? N   Housekeeping or managing your Housekeeping? N     Patient Care Team: [name], MD as PCP - General (Internal Medicine) Jerline Pain, MD as PCP - Cardiology (Cardiology) Charlton Haws, [facility] as Pharmacist (Pharmacist)  Indicate any recent Medical Services you may have received from other than [facility] providers in the past year (date may be approximate).     Assessment:   This is a routine wellness examination for [place].  Hearing/Vision screen Hearing Screening - Comments:: Patient wears hearing  aids. Vision Screening - Comments:: Wears rx glasses - patient not up to date with yearly eye exam.  Dietary issues and exercise activities discussed: Current Exercise Habits: The patient does not participate in regular exercise at present, Exercise limited by: None identified   Goals Addressed             This Visit's Progress    Client will verbalize knowledge of diabetes self-management as evidenced by Hgb A1C <7 or as defined by provider.            Depression Screen    02/02/2022    3:16 PM 11/04/2020    2:00 PM 07/30/2020   10:57 AM 06/12/2019    9:51 AM 07/23/2018   10:59 AM 04/13/2017    1:31 PM 07/12/2016    1:13 PM  PHQ 2/9 Scores  PHQ - 2 Score 0 0 0 0 0 0 0    Fall Risk    02/02/2022    3:05 PM 12/22/2021    2:40 PM 11/18/2021    1:15 PM 09/27/2021    3:00 PM 08/15/2021    3:00 PM  [place] in the past year? 0 0 0 0 0  Comment  continues to deny new/ recent falls x last 12 months; not currently using assistive devices continues to deny falls x 12 months; not currently  using assistive devices continues to deny falls x 12 months; not currently using assistive devices   Number falls in past yr: 0 0 0 0 0  Injury with Fall? 0 0 0 0 0  Comment  N/A- no falls reported x last 12 months N/A- no falls reported x 12 months N/A- no falls reported N/A- no falls reported  Risk for fall due to : Medication side effect;Other (Comment) Medication side effect;Other (Comment) Medication side effect;Other (Comment) Medication side effect;Other (Comment) Medication side effect  Risk for fall due to: Comment wound on right leg per patient ongoing (R) LE wound intermittent lower extremity swelling tendency for LE swelling   Follow up Falls prevention discussed Falls prevention discussed Falls prevention discussed Falls prevention discussed Falls prevention discussed    FALL RISK PREVENTION PERTAINING TO THE HOME:  Any stairs in or around the home? No  If so, are there any  without handrails? No  Home free of loose throw rugs in walkways, pet beds, electrical cords, etc? Yes  Adequate lighting in your home to reduce risk of falls? Yes   ASSISTIVE DEVICES UTILIZED TO PREVENT FALLS:  Life alert? No  Use of a cane, walker or w/c? No  Grab bars in the bathroom? No  Shower chair or bench in shower? No  Elevated toilet seat or a handicapped toilet? No   TIMED UP AND GO:  Was the test performed? No . Phone Visit  Cognitive Function:        02/02/2022    3:21 PM  6CIT Screen  What Year? 0 points  What month? 0 points  What time? 0 points  Count back from 20 0 points  Months in reverse 0 points  Repeat phrase 0 points  Total Score 0 points    Immunizations Immunization History  Administered Date(s) Administered   Fluad Quad(high Dose 65+) 03/05/2019, 01/28/2020, 02/08/2021   Influenza Split 03/02/2011   Influenza Whole 01/23/2013   Influenza, High Dose Seasonal PF 01/28/2016, 01/23/2017, 01/21/2018   Influenza,inj,Quad PF,6+ Mos 02/23/2014, 02/24/2015   PFIZER(Purple Top)SARS-COV-2 Vaccination 07/08/2019, 07/29/2019, 02/07/2020   Pneumococcal Conjugate-13 05/04/2015   Pneumococcal Polysaccharide-23 10/02/2012, 01/21/2018   Tdap 12/15/2010, 09/21/2011    TDAP status: Due, Education has been provided regarding the importance of this vaccine. Advised may receive this vaccine at local pharmacy or Health Dept. Aware to provide a copy of the vaccination record if obtained from local pharmacy or Health Dept. Verbalized acceptance and understanding.  Flu Vaccine status: Due, Education has been provided regarding the importance of this vaccine. Advised may receive this vaccine at local pharmacy or Health Dept. Aware to provide a copy of the vaccination record if obtained from local pharmacy or Health Dept. Verbalized acceptance and understanding.  Pneumococcal vaccine status: Up to date  Covid-19 vaccine status: Completed vaccines  Qualifies for  Shingles Vaccine? Yes   Zostavax completed No   Shingrix Completed?: No.    Education has been provided regarding the importance of this vaccine. Patient has been advised to call insurance company to determine out of pocket expense if they have not yet received this vaccine. Advised may also receive vaccine at local pharmacy or Health Dept. Verbalized acceptance and understanding.  Screening Tests Health Maintenance  Topic Date Due   Zoster Vaccines- Shingrix (1 of 2) Never done   OPHTHALMOLOGY EXAM  05/28/2019   COVID-19 Vaccine (4 - Pfizer risk series) 04/03/2020   TETANUS/TDAP  09/20/2021   INFLUENZA VACCINE  12/13/2021   HEMOGLOBIN A1C  02/09/2022   FOOT EXAM  04/27/2022   Diabetic kidney evaluation - Urine ACR  11/08/2022   Diabetic kidney evaluation - GFR measurement  01/06/2023   COLONOSCOPY (Pts 45-43yr Insurance coverage will need to be confirmed)  06/21/2025   Pneumonia Vaccine 76 Years old  Completed   Hepatitis C Screening  Completed   HPV VACCINES  Aged Out    Health Maintenance  Health Maintenance Due  Topic Date Due   Zoster Vaccines- Shingrix (1 of 2) Never done   OPHTHALMOLOGY EXAM  05/28/2019   COVID-19 Vaccine (4 - Pfizer risk series) 04/03/2020   TETANUS/TDAP  09/20/2021   INFLUENZA VACCINE  12/13/2021    Colorectal cancer screening: Type of screening: Colonoscopy. Completed 06/22/2015. Repeat every 10 years  Lung Cancer Screening: (Low Dose CT Chest recommended if Age 74-80years, 30 pack-year currently smoking OR have quit w/in 15years.) does not qualify.   Lung Cancer Screening Referral: no  Additional Screening:  Hepatitis C Screening: does qualify; Completed 09/22/2012  Vision Screening: Recommended annual ophthalmology exams for early detection of glaucoma and other disorders of the eye. Is the patient up to date with their annual eye exam?  No  Who is the provider or what is the name of the office in which the patient attends annual eye exams? No  current ophthalmologist If pt is not established with a provider, would they like to be referred to a provider to establish care? Yes .   Dental Screening: Recommended annual dental exams for proper oral hygiene  Community Resource Referral / Chronic Care Management: CRR required this visit?  Yes   CCM required this visit?  No      Plan:     I have personally reviewed and noted the following in the patient's chart:   Medical and social history Use of alcohol, tobacco or illicit drugs  Current medications and supplements including opioid prescriptions. Patient is currently taking opioid prescriptions. Information provided to patient regarding non-opioid alternatives. Patient advised to discuss non-opioid treatment plan with their provider. Functional ability and status Nutritional status Physical activity Advanced directives List of other physicians Hospitalizations, surgeries, and ER visits in previous 12 months Vitals Screenings to include cognitive, depression, and falls Referrals and appointments  In addition, I have reviewed and discussed with patient certain preventive protocols, quality metrics, and best practice recommendations. A written personalized care plan for preventive services as well as general preventive health recommendations were provided to patient.     SSheral Flow LPN   [id number]  Nurse Notes: N/A

## 2022-02-02 NOTE — Patient Instructions (Addendum)
[name] , Thank you for taking time to come for your Medicare Wellness Visit. I appreciate your ongoing commitment to your health goals. Please review the following plan we discussed and let me know if I can assist you in the future.   These are the goals we discussed:  Goals      Client will verbalize knowledge of diabetes self-management as evidenced by Hgb A1C <7 or as defined by provider.         Manage My Medicine     Timeframe:  Long-Range Goal Priority:  High Start Date:      11/16/20                       Expected End Date: 02/17/2022                     Follow Up Date 12/27/2021   - call for medicine refill 2 or 3 days before it runs out - call if I am sick and can't take my medicine - keep a list of all the medicines I take; vitamins and herbals too - use a pillbox to sort medicine -check blood pressure daily -weigh daily, take an extra torsemide dose if weight increase 2+ lbs overnight or 5+ lbs in a week -engage in dietary modifications by reducing salt    Why is this important?   These steps will help you keep on track with your medicines.   Notes:         This is a list of the screening recommended for you and due dates:  Health Maintenance  Topic Date Due   Zoster (Shingles) Vaccine (1 of 2) Never done   Eye exam for diabetics  05/28/2019   COVID-19 Vaccine (4 - Pfizer risk series) 04/03/2020   Tetanus Vaccine  09/20/2021   Flu Shot  12/13/2021   Hemoglobin A1C  02/09/2022   Complete foot exam   04/27/2022   Yearly kidney health urinalysis for diabetes  11/08/2022   Yearly kidney function blood test for diabetes  01/06/2023   Colon Cancer Screening  06/21/2025   Pneumonia Vaccine  Completed   Hepatitis C Screening: USPSTF Recommendation to screen - Ages 4-74 yo.  Completed   HPV Vaccine  Aged Out    Advanced directives: No  Conditions/risks identified: Yes; Type II Diabetes Mellitus  Next appointment: Follow up in one year for your annual wellness  visit.   Preventive Care 74 Years and Older, Male  Preventive care refers to lifestyle choices and visits with your health care provider that can promote health and wellness. What does preventive care include? A yearly physical exam. This is also called an annual well check. Dental exams once or twice a year. Routine eye exams. Ask your health care provider how often you should have your eyes checked. Personal lifestyle choices, including: Daily care of your teeth and gums. Regular physical activity. Eating a healthy diet. Avoiding tobacco and drug use. Limiting alcohol use. Practicing safe sex. Taking low doses of aspirin every day. Taking vitamin and mineral supplements as recommended by your health care provider. What happens during an annual well check? The services and screenings done by your health care provider during your annual well check will depend on your age, overall health, lifestyle risk factors, and family history of disease. Counseling  Your health care provider may ask you questions about your: Alcohol use. Tobacco use. Drug use. Emotional well-being. Home and relationship well-being.  Sexual activity. Eating habits. History of falls. Memory and ability to understand (cognition). Work and work Statistician. Screening  You may have the following tests or measurements: Height, weight, and BMI. Blood pressure. Lipid and cholesterol levels. These may be checked every 5 years, or more frequently if you are over 69 years old. Skin check. Lung cancer screening. You may have this screening every year starting at age 82 if you have a 30-pack-year history of smoking and currently smoke or have quit within the past 15 years. Fecal occult blood test (FOBT) of the stool. You may have this test every year starting at age 41. Flexible sigmoidoscopy or colonoscopy. You may have a sigmoidoscopy every 5 years or a colonoscopy every 10 years starting at age 71. Prostate cancer  screening. Recommendations will vary depending on your family history and other risks. Hepatitis C blood test. Hepatitis B blood test. Sexually transmitted disease (STD) testing. Diabetes screening. This is done by checking your blood sugar (glucose) after you have not eaten for a while (fasting). You may have this done every 1-3 years. Abdominal aortic aneurysm (AAA) screening. You may need this if you are a current or former smoker. Osteoporosis. You may be screened starting at age 17 if you are at high risk. Talk with your health care provider about your test results, treatment options, and if necessary, the need for more tests. Vaccines  Your health care provider may recommend certain vaccines, such as: Influenza vaccine. This is recommended every year. Tetanus, diphtheria, and acellular pertussis (Tdap, Td) vaccine. You may need a Td booster every 10 years. Zoster vaccine. You may need this after age 62. Pneumococcal 13-valent conjugate (PCV13) vaccine. One dose is recommended after age 57. Pneumococcal polysaccharide (PPSV23) vaccine. One dose is recommended after age 2. Talk to your health care provider about which screenings and vaccines you need and how often you need them. This information is not intended to replace advice given to you by your health care provider. Make sure you discuss any questions you have with your health care provider. Document Released: 05/28/2015 Document Revised: 01/19/2016 Document Reviewed: 03/02/2015 Elsevier Interactive Patient Education  2017 [employer] Prevention in the Home Falls can cause injuries. They can happen to people of all ages. There are many things you can do to make your home safe and to help prevent falls. What can I do on the outside of my home? Regularly fix the edges of walkways and driveways and fix any cracks. Remove anything that might make you trip as you walk through a door, such as a raised step or threshold. Trim any  bushes or trees on the path to your home. Use bright outdoor lighting. Clear any walking paths of anything that might make someone trip, such as rocks or tools. Regularly check to see if handrails are loose or broken. Make sure that both sides of any steps have handrails. Any raised decks and porches should have guardrails on the edges. Have any leaves, snow, or ice cleared regularly. Use sand or salt on walking paths during winter. Clean up any spills in your garage right away. This includes oil or grease spills. What can I do in the bathroom? Use night lights. Install grab bars by the toilet and in the tub and shower. Do not use towel bars as grab bars. Use non-skid mats or decals in the tub or shower. If you need to sit down in the shower, use a plastic, non-slip stool. Keep the floor  dry. Clean up any water that spills on the floor as soon as it happens. Remove soap buildup in the tub or shower regularly. Attach bath mats securely with double-sided non-slip rug tape. Do not have throw rugs and other things on the floor that can make you trip. What can I do in the bedroom? Use night lights. Make sure that you have a light by your bed that is easy to reach. Do not use any sheets or blankets that are too big for your bed. They should not hang down onto the floor. Have a firm chair that has side arms. You can use this for support while you get dressed. Do not have throw rugs and other things on the floor that can make you trip. What can I do in the kitchen? Clean up any spills right away. Avoid walking on wet floors. Keep items that you use a lot in easy-to-reach places. If you need to reach something above you, use a strong step stool that has a grab bar. Keep electrical cords out of the way. Do not use floor polish or wax that makes floors slippery. If you must use wax, use non-skid floor wax. Do not have throw rugs and other things on the floor that can make you trip. What can I do  with my stairs? Do not leave any items on the stairs. Make sure that there are handrails on both sides of the stairs and use them. Fix handrails that are broken or loose. Make sure that handrails are as long as the stairways. Check any carpeting to make sure that it is firmly attached to the stairs. Fix any carpet that is loose or worn. Avoid having throw rugs at the top or bottom of the stairs. If you do have throw rugs, attach them to the floor with carpet tape. Make sure that you have a light switch at the top of the stairs and the bottom of the stairs. If you do not have them, ask someone to add them for you. What else can I do to help prevent falls? Wear shoes that: Do not have high heels. Have rubber bottoms. Are comfortable and fit you well. Are closed at the toe. Do not wear sandals. If you use a stepladder: Make sure that it is fully opened. Do not climb a closed stepladder. Make sure that both sides of the stepladder are locked into place. Ask someone to hold it for you, if possible. Clearly mark and make sure that you can see: Any grab bars or handrails. First and last steps. Where the edge of each step is. Use tools that help you move around (mobility aids) if they are needed. These include: Canes. Walkers. Scooters. Crutches. Turn on the lights when you go into a dark area. Replace any light bulbs as soon as they burn out. Set up your furniture so you have a clear path. Avoid moving your furniture around. If any of your floors are uneven, fix them. If there are any pets around you, be aware of where they are. Review your medicines with your doctor. Some medicines can make you feel dizzy. This can increase your chance of falling. Ask your doctor what other things that you can do to help prevent falls. This information is not intended to replace advice given to you by your health care provider. Make sure you discuss any questions you have with your health care  provider. Document Released: 02/25/2009 Document Revised: 10/07/2015 Document Reviewed: 06/05/2014 Else[employer] Interactive Patient  Education  2017 [employer]  

## 2022-02-06 ENCOUNTER — Encounter (HOSPITAL_BASED_OUTPATIENT_CLINIC_OR_DEPARTMENT_OTHER): Payer: Medicare HMO | Admitting: Internal Medicine

## 2022-02-06 ENCOUNTER — Telehealth: Payer: Self-pay

## 2022-02-06 DIAGNOSIS — I130 Hypertensive heart and chronic kidney disease with heart failure and stage 1 through stage 4 chronic kidney disease, or unspecified chronic kidney disease: Secondary | ICD-10-CM | POA: Diagnosis not present

## 2022-02-06 DIAGNOSIS — I5042 Chronic combined systolic (congestive) and diastolic (congestive) heart failure: Secondary | ICD-10-CM | POA: Diagnosis not present

## 2022-02-06 DIAGNOSIS — L97918 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 with other specified severity: Secondary | ICD-10-CM | POA: Diagnosis not present

## 2022-02-06 DIAGNOSIS — I87311 Chronic venous hypertension (idiopathic) with ulcer of right lower extremity: Secondary | ICD-10-CM | POA: Diagnosis not present

## 2022-02-06 DIAGNOSIS — I25119 Atherosclerotic heart disease of native coronary artery with unspecified angina pectoris: Secondary | ICD-10-CM | POA: Diagnosis not present

## 2022-02-06 DIAGNOSIS — E11621 Type 2 diabetes mellitus with foot ulcer: Secondary | ICD-10-CM | POA: Diagnosis not present

## 2022-02-06 DIAGNOSIS — E1122 Type 2 diabetes mellitus with diabetic chronic kidney disease: Secondary | ICD-10-CM | POA: Diagnosis not present

## 2022-02-06 DIAGNOSIS — R6889 Other general symptoms and signs: Secondary | ICD-10-CM | POA: Diagnosis not present

## 2022-02-06 DIAGNOSIS — E11622 Type 2 diabetes mellitus with other skin ulcer: Secondary | ICD-10-CM | POA: Diagnosis not present

## 2022-02-06 DIAGNOSIS — Z87891 Personal history of nicotine dependence: Secondary | ICD-10-CM | POA: Diagnosis not present

## 2022-02-06 NOTE — Progress Notes (Signed)
TANNAR, BROKER ([id number]) Visit Report for 02/06/2022 Chief Complaint Document Details Patient Name: Date of Service: Corey [name]. 02/06/2022 9:00 A [name] Medical Record Number: [id number] Patient Account Number: [id number] Date of Birth/Sex: Treating RN: [birth year] (76 y.o. Corey [name] Primary Care Provider: Billey Gosling Other Clinician: Referring Provider: Treating Provider/Extender: Mickle Asper in Treatment: 13 Information Obtained from: Patient Chief Complaint 11/01/2021; right lower extremity wound Electronic Signature(s) Signed: 02/06/2022 10:16:22 AM By: Kalman Shan DO Entered By: Kalman Shan on 02/06/2022 09:35:59 -------------------------------------------------------------------------------- Debridement Details Patient Name: Date of Service: Corey [name], Corey [name]. 02/06/2022 9:00 A [name] Medical Record Number: [id number] Patient Account Number: [id number] Date of Birth/Sex: Treating RN: 1946/03/10 (75 y.o. Corey [name] Primary Care Provider: Billey Gosling Other Clinician: Referring Provider: Treating Provider/Extender: Mickle Asper in Treatment: 13 Debridement Performed for Assessment: Wound #1 Right,Lateral Lower Leg Performed By: Physician Kalman Shan, DO Debridement Type: Debridement Severity of Tissue Pre Debridement: Fat layer exposed Level of Consciousness (Pre-procedure): Awake and Alert Pre-procedure Verification/Time Out Yes - 09:20 Taken: Start Time: 09:21 Pain Control: Lidocaine 5% topical ointment T Area Debrided (L x W): otal 9.3 (cm) x 7 (cm) = 65.1 (cm) Tissue and other material debrided: Viable, Non-Viable, Slough, Subcutaneous, Skin: Dermis , Skin: Epidermis, Slough Level: Skin/Subcutaneous Tissue Debridement Description: Excisional Instrument: Curette Bleeding: Minimum Hemostasis Achieved: Pressure End Time: 09:26 Procedural Pain: 0 Post Procedural Pain: 0 Response to Treatment:  Procedure was tolerated well Level of Consciousness (Post- Awake and Alert procedure): Post Debridement Measurements of Total Wound Length: (cm) 9.3 Width: (cm) 7 Depth: (cm) 0.1 Volume: (cm) 5.113 Character of Wound/Ulcer Post Debridement: Improved Severity of Tissue Post Debridement: Fat layer exposed Post Procedure Diagnosis Same as Pre-procedure Electronic Signature(s) Signed: 02/06/2022 10:16:22 AM By: Kalman Shan DO Signed: 02/06/2022 5:50:10 PM By: Deon Pilling RN, BSN Entered By: Deon Pilling on 02/06/2022 09:26:48 -------------------------------------------------------------------------------- HPI Details Patient Name: Date of Service: Corey [name], Corey [name]. 02/06/2022 9:00 A [name] Medical Record Number: [id number] Patient Account Number: [id number] Date of Birth/Sex: Treating RN: May 17, 1945 (76 y.o. Corey [name] Primary Care Provider: Billey Gosling Other Clinician: Referring Provider: Treating Provider/Extender: Mickle Asper in Treatment: 13 History of Present Illness HPI Description: Admission 11/01/2021 [name] Is a 76 year old male with a past medical history of of type 2 diabetes, coronary artery disease, stage IV kidney disease acute on chronic combined systolic and diastolic heart failure that presents to the clinic for a 36-monthhistory of right lower extremity wound. He states he is not sure how this started. It is completely eschared. He reports his wound has been stable for the past month. He has been keeping the area covered. He has mild chronic pain. He denies signs of infection. He states he has a history of lower extremity ulcers last being on the left leg. He could not tell me how this was treated in the past. 6/27; patient presents for follow-up. He is scheduled for his lower extremity arterial studies on July 6th. He has been using Medihoney to the wound bed. He has no issues or complaints today. He denies signs of  infection. 7/6; patient presents for follow-up. He has home health that reported the wound having odor and increased pain last week. He was sent to urgent care. He was started on doxycycline. He is continuing Medihoney. He has his scheduled ABIs w/TBIs today. 7/10; patient presents for follow-up. He had his  arterial studies done last week. He had triphasic waveforms throughout the right lower extremity. His TBI was 0.64. He has good pedal pulses. He is almost done with his course of antibiotics. He has been using mupirocin ointment to the wound bed. 7/17; patient presents for follow-up. He has been using Iodosorb under compression therapy. He has no issues or complaints today. He states he is receiving [facility] antibiotic tomorrow In the mail. 7/24; patient presents for follow-up. He has been doing Dakin's wet-to-dry dressings to the wound bed daily without issues. He currently denies signs of infection. He reports completing his course of doxycycline. He has his [facility] antibiotic with him today. 7/31; patient presents for follow-up. We have been using Iodoflex with [facility] under compression therapy. He has no issues or complaints today. He denies signs of infection. 8/7; patient presents for follow-up. We have been using Iodoflex with [facility] under compression therapy. Home health came out once last week to change the wrap. He has no issues or complaints today. He denies signs of infection. 8/14; patient presents for follow-up. We have been using [facility] with Hydrofera Blue under compression therapy. He has tolerated this well. He has no issues or complaints today. 8/21; patient presents for follow-up. We have been using Keystone with Hydrofera Blue under compression therapy. The Hydrofera Blue was stuck on tightly to the wound bed today. Other than that he has no issues or complaints. We have not heard back for insurance approval for skin sub. 8/28; patient presents for follow-up. We ordered  blast X and collagen powder at last clinic visit. It is unclear if he has received this. We placed Keystone with Fibracol under 3 layer compression last week. Patient has home health. Patient has no issues or complaints today. He was not approved for TheraSkin. 9/5; patient presents for follow-up. Insurance would not cover blast X. We do have a sample and he would like to try this. We have been using [facility] antibiotic and collagen under 3 layer compression. He has no issues or complaints today. 9/18; patient presents for follow-up. We have been using collagen with blast X and Keystone under compression therapy. He has no issues or complaints today. 9/25; patient presents for follow-up. We have continued to use collagen, blast X and [facility] under compression therapy. Patient has no issues or complaints today. Electronic Signature(s) Signed: 02/06/2022 10:16:22 AM By: Kalman Shan DO Entered By: Kalman Shan on 02/06/2022 09:36:20 -------------------------------------------------------------------------------- Physical Exam Details Patient Name: Date of Service: Corey [name], Corey [name]. 02/06/2022 9:00 A [name] Medical Record Number: [id number] Patient Account Number: [id number] Date of Birth/Sex: Treating RN: 04-10-1946 (76 y.o. Corey [name] Primary Care Provider: Billey Gosling Other Clinician: Referring Provider: Treating Provider/Extender: [name] in Treatment: 13 Constitutional respirations regular, non-labored and within target range for patient.. Cardiovascular 2+ dorsalis pedis/posterior tibialis pulses. Psychiatric pleasant and cooperative. Notes Right lower extremity: T the anterior aspect there is granulation tissue and nonviable tissue present. No signs of surrounding infection. Good edema control. o Electronic Signature(s) Signed: 02/06/2022 10:16:22 AM By: Kalman Shan DO Entered By: Kalman Shan on 02/06/2022  09:36:42 -------------------------------------------------------------------------------- Physician Orders Details Patient Name: Date of Service: Corey [name], Corey [name]. 02/06/2022 9:00 A [name] Medical Record Number: [id number] Patient Account Number: [id number] Date of Birth/Sex: Treating RN: 1946/03/13 (76 y.o. Corey [name] Primary Care Provider: Billey Gosling Other Clinician: Referring Provider: Treating Provider/Extender: Mickle Asper in Treatment: 33 Verbal / Phone Orders: No Diagnosis Coding  ICD-10 Coding Code Description B35.329 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 with other specified severity E11.622 Type 2 diabetes mellitus with other skin ulcer I50.42 Chronic combined systolic (congestive) and diastolic (congestive) heart failure I25.119 Atherosclerotic heart disease of native coronary artery with unspecified angina pectoris I87.311 Chronic venous hypertension (idiopathic) with ulcer of right lower extremity Follow-up Appointments ppointment in 1 week. - Dr. Heber [place] and [place], Room 8 Monday Return A ppointment in 2 weeks. - Dr. Heber [place] and Tammi Klippel, Room 8 Monday Return A Other: - ****[facility] topical compounding antibiotics bring to appt each time.**** Bring in the blastx as well. Anesthetic (In clinic) Topical Lidocaine 5% applied to wound bed Cellular or Tissue Based Products Cellular or Tissue Based Product Type: - run insurance for [employer], epicord, and organogenesis advance tissue product. Theraskin not approved. Epicord not covered. [employer] Apligraf approved. Bathing/ Shower/ Hygiene May shower with protection but do not get wound dressing(s) wet. Edema Control - Lymphedema / SCD / Other Elevate legs to the level of the heart or above for 30 minutes daily and/or when sitting, a frequency of: - 3-4 times a day throughout the day. Avoid standing for long periods of time. Home Health No change in wound  care orders this week; continue Home Health for wound care. May utilize formulary equivalent dressing for wound treatment orders unless otherwise specified. - home health to twice a week. Compounding topical antibiotics directly to wound bed. Then Place collagen and blastx. Use plain collagen over the topical antibiotics. Other Home Health Orders/Instructions: - [facility] Wound Treatment Wound #1 - Lower Leg Wound Laterality: Right, Lateral Cleanser: Soap and Water (Home Health) 3 x Per Week/30 Days Discharge Instructions: May shower and wash wound with dial antibacterial soap and water prior to dressing change. Cleanser: Wound Cleanser (Home Health) 3 x Per Week/30 Days Discharge Instructions: Cleanse the wound with wound cleanser prior to applying a clean dressing using gauze sponges, not tissue or cotton balls. Topical: collagen and blastx (Home Health) 3 x Per Week/30 Days Discharge Instructions: apply over the topical compounding antibiotics. Topical: [facility]-topical compounding antibiotics (Home Health) 3 x Per Week/30 Days Discharge Instructions: apply directly to wound bed. Prim Dressing: Fibracol Plus Dressing, 4x4.38 in (collagen) (Home Health) 3 x Per Week/30 Days ary Discharge Instructions: Moisten collagen with saline or hydrogel until the blastx and powder collagen arrive. Secondary Dressing: ABD Pad, 8x10 (Home Health) 3 x Per Week/30 Days Discharge Instructions: Apply over primary dressing as directed. Secondary Dressing: Woven Gauze Sponge, Non-Sterile 4x4 in (Home Health) 3 x Per Week/30 Days Discharge Instructions: Apply over primary dressing as directed. Compression Wrap: ThreePress (3 layer compression wrap) 3 x Per Week/30 Days Discharge Instructions: Apply three layer compression as directed. Electronic Signature(s) Signed: 02/06/2022 10:16:22 AM By: Kalman Shan DO Entered By: Kalman Shan on 02/06/2022  09:36:51 -------------------------------------------------------------------------------- Problem List Details Patient Name: Date of Service: Corey [name], Corey [name]. 02/06/2022 9:00 A [name] Medical Record Number: [id number] Patient Account Number: [id number] Date of Birth/Sex: Treating RN: [birth year] (75 y.o. Corey [name] Primary Care Provider: Billey Gosling Other Clinician: Referring Provider: Treating Provider/Extender: Mickle Asper in Treatment: 13 Active Problems ICD-10 Encounter Code Description Active Date MDM Diagnosis L97.918 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 with other 11/01/2021 No Yes specified severity E11.622 Type 2 diabetes mellitus with other skin ulcer 11/01/2021 No Yes I50.42 Chronic combined systolic (congestive) and diastolic (congestive) heart failure 11/01/2021 No  Yes I25.119 Atherosclerotic heart disease of native coronary artery with unspecified angina 11/01/2021 No Yes pectoris I87.311 Chronic venous hypertension (idiopathic) with ulcer of right lower extremity 12/26/2021 No Yes Inactive Problems Resolved Problems Electronic Signature(s) Signed: 02/06/2022 10:16:22 AM By: Kalman Shan DO Entered By: Kalman Shan on 02/06/2022 09:35:42 -------------------------------------------------------------------------------- Progress Note Details Patient Name: Date of Service: Corey [name], Corey [name]. 02/06/2022 9:00 A [name] Medical Record Number: [id number] Patient Account Number: [id number] Date of Birth/Sex: Treating RN: 08-22-1945 (76 y.o. Corey [name] Primary Care Provider: Billey Gosling Other Clinician: Referring Provider: Treating Provider/Extender: Mickle Asper in Treatment: 13 Subjective Chief Complaint Information obtained from Patient 11/01/2021; right lower extremity wound History of Present Illness (HPI) Admission 11/01/2021 Mr. Stark Aguinaga Is a 76 year old male with a past medical  history of of type 2 diabetes, coronary artery disease, stage IV kidney disease acute on chronic combined systolic and diastolic heart failure that presents to the clinic for a 43-monthhistory of right lower extremity wound. He states he is not sure how this started. It is completely eschared. He reports his wound has been stable for the past month. He has been keeping the area covered. He has mild chronic pain. He denies signs of infection. He states he has a history of lower extremity ulcers last being on the left leg. He could not tell me how this was treated in the past. 6/27; patient presents for follow-up. He is scheduled for his lower extremity arterial studies on July 6th. He has been using Medihoney to the wound bed. He has no issues or complaints today. He denies signs of infection. 7/6; patient presents for follow-up. He has home health that reported the wound having odor and increased pain last week. He was sent to urgent care. He was started on doxycycline. He is continuing Medihoney. He has his scheduled ABIs w/TBIs today. 7/10; patient presents for follow-up. He had his arterial studies done last week. He had triphasic waveforms throughout the right lower extremity. His TBI was 0.64. He has good pedal pulses. He is almost done with his course of antibiotics. He has been using mupirocin ointment to the wound bed. 7/17; patient presents for follow-up. He has been using Iodosorb under compression therapy. He has no issues or complaints today. He states he is receiving [facility] antibiotic tomorrow In the mail. 7/24; patient presents for follow-up. He has been doing Dakin's wet-to-dry dressings to the wound bed daily without issues. He currently denies signs of infection. He reports completing his course of doxycycline. He has his [facility] antibiotic with him today. 7/31; patient presents for follow-up. [facility] He has no issues or  complaints today. He denies signs of infection. 8/7; patient presents for follow-up. [facility] Home health came out once last week to change the wrap. He has no issues or complaints today. He denies signs of infection. 8/14; patient presents for follow-up. We have been using Keystone with Hydrofera Blue under compression therapy. He has tolerated this well. He has no issues or complaints today. 8/21; patient presents for follow-up. We have been using [facility] with Hydrofera Blue under compression therapy. The Hydrofera Blue was stuck on tightly to the wound bed today. Other than that he has no issues or complaints. We have not heard back for insurance approval for skin sub. 8/28; patient presents for follow-up. We ordered blast X and collagen powder at  last clinic visit. It is unclear if he has received this. We placed Keystone with Fibracol under 3 layer compression last week. Patient has home health. Patient has no issues or complaints today. He was not approved for TheraSkin. 9/5; patient presents for follow-up. Insurance would not cover blast X. We do have a sample and he would like to try this. We have been using [facility] antibiotic and collagen under 3 layer compression. He has no issues or complaints today. 9/18; patient presents for follow-up. We have been using collagen with blast X and [facility] under compression therapy. He has no issues or complaints today. 9/25; patient presents for follow-up. We have continued to use collagen, blast X and [facility] under compression therapy. Patient has no issues or complaints today. Patient History Family History Cancer - Father, Diabetes - Mother,Father, Heart Disease - Mother,Siblings, Hypertension - Mother,Father. Social History Former smoker - quit 1970, Marital Status - Married, Drug Use - No History, Caffeine Use - Moderate - coffee. Medical History Eyes Patient has history of  Cataracts Respiratory Patient has history of Sleep Apnea - has CPAP does not wear Cardiovascular Patient has history of Congestive Heart Failure, Hypertension, Myocardial Infarction - 2014 Endocrine Patient has history of Type II Diabetes Musculoskeletal Patient has history of Gout Neurologic Denies history of Seizure Disorder Hospitalization/Surgery History - 06/2021 inguinal hernia repair. - cardioversion 09/2020. - ventral hernia repair 3/19. 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 Vitals Time Taken: 8:56 AM, Height: 66 in, Weight: 145 lbs, BMI: 23.4, Temperature: 97.8 F, Pulse: 58 bpm, Respiratory Rate: 18 breaths/min, Blood Pressure: 141/83 mmHg. Cardiovascular 2+ dorsalis pedis/posterior tibialis pulses. Psychiatric pleasant and cooperative. General Notes: Right lower extremity: T the anterior aspect there is granulation tissue and nonviable tissue present. No signs of surrounding infection. Good o edema control. Integumentary (Hair, Skin) Wound #1 status is Open. Original cause of wound was Gradually Appeared. The date acquired was: 08/15/2021. The wound has been in treatment 13 weeks. The wound is located on the Right,Lateral Lower Leg. The wound measures 9.3cm length x 7cm width x 0.1cm depth; 51.129cm^2 area and 5.113cm^3 volume. There is Fat Layer (Subcutaneous Tissue) exposed. There is a medium amount of serosanguineous drainage noted. The wound margin is distinct with the outline attached to the wound base. There is large (67-100%) red, pink granulation within the wound bed. There is a small (1-33%) amount of necrotic tissue within the wound bed including Adherent Slough. Assessment Active  Problems ICD-10 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 with other specified severity Type 2 diabetes mellitus with other skin ulcer Chronic combined systolic (congestive) and diastolic (congestive) heart failure Atherosclerotic heart disease of native coronary artery with unspecified angina pectoris Chronic venous hypertension (idiopathic) with ulcer of right lower extremity Patient's wound has shown improvement in size and appearance since last clinic visit. I debrided nonviable tissue. I recommended continuing the course with blast X, collagen and [facility] antibiotic under compression therapy. Follow-up in 1 week. Procedures Wound #1 Pre-procedure diagnosis of Wound #1 is a Diabetic Wound/Ulcer of the Lower Extremity located on the Right,Lateral Lower Leg .Severity of Tissue Pre Debridement is: Fat layer exposed. There was a Excisional Skin/Subcutaneous Tissue Debridement with a total area of 65.1 sq cm performed by Kalman Shan, DO. With the following instrument(s): Curette to remove Viable and  Non-Viable tissue/material. Material removed includes Subcutaneous Tissue, Slough, Skin: Dermis, and Skin: Epidermis after achieving pain control using Lidocaine 5% topical ointment. A time out was conducted at 09:20, prior to the start of the procedure. A Minimum amount of bleeding was controlled with Pressure. The procedure was tolerated well with a pain level of 0 throughout and a pain level of 0 following the procedure. Post Debridement Measurements: 9.3cm length x 7cm width x 0.1cm depth; 5.113cm^3 volume. Character of Wound/Ulcer Post Debridement is improved. Severity of Tissue Post Debridement is: Fat layer exposed. Post procedure Diagnosis Wound #1: Same as Pre-Procedure Pre-procedure diagnosis of Wound #1 is a Diabetic Wound/Ulcer of the Lower Extremity located on the Right,Lateral Lower Leg . There was a Three Layer Compression Therapy Procedure by Deon Pilling,  RN. Post procedure Diagnosis Wound #1: Same as Pre-Procedure Plan Follow-up Appointments: Return Appointment in 1 week. - Dr. Heber [place] and [place], Room 8 Monday Return Appointment in 2 weeks. - Dr. Heber  and Tammi Klippel, Room 8 Monday Other: - ****[facility] topical compounding antibiotics bring to appt each time.**** Bring in the blastx as well. Anesthetic: (In clinic) Topical Lidocaine 5% applied to wound bed Cellular or Tissue Based Products: Cellular or Tissue Based Product Type: - run insurance for [employer], epicord, and organogenesis advance tissue product. Theraskin not approved. Epicord not covered. [employer] Apligraf approved. Bathing/ Shower/ Hygiene: May shower with protection but do not get wound dressing(s) wet. Edema Control - Lymphedema / SCD / Other: Elevate legs to the level of the heart or above for 30 minutes daily and/or when sitting, a frequency of: - 3-4 times a day throughout the day. Avoid standing for long periods of time. Home Health: No change in wound care orders this week; continue Home Health for wound care. May utilize formulary equivalent dressing for wound treatment orders unless otherwise specified. - home health to twice a week. Compounding topical antibiotics directly to wound bed. Then Place collagen and blastx. Use plain collagen over the topical antibiotics. Other Home Health Orders/Instructions: - [facility] WOUND #1: - Lower Leg Wound Laterality: Right, Lateral Cleanser: Soap and Water (Home Health) 3 x Per Week/30 Days Discharge Instructions: May shower and wash wound with dial antibacterial soap and water prior to dressing change. Cleanser: Wound Cleanser (Home Health) 3 x Per Week/30 Days Discharge Instructions: Cleanse the wound with wound cleanser prior to applying a clean dressing using gauze sponges, not tissue or cotton balls. Topical: collagen and blastx (Home Health) 3 x Per Week/30 Days Discharge Instructions: apply over  the topical compounding antibiotics. Topical: [facility]-topical compounding antibiotics (Home Health) 3 x Per Week/30 Days Discharge Instructions: apply directly to wound bed. Prim Dressing: Fibracol Plus Dressing, 4x4.38 in (collagen) (Home Health) 3 x Per Week/30 Days ary Discharge Instructions: Moisten collagen with saline or hydrogel until the blastx and powder collagen arrive. Secondary Dressing: ABD Pad, 8x10 (Home Health) 3 x Per Week/30 Days Discharge Instructions: Apply over primary dressing as directed. Secondary Dressing: Woven Gauze Sponge, Non-Sterile 4x4 in (Home Health) 3 x Per Week/30 Days Discharge Instructions: Apply over primary dressing as directed. Com pression Wrap: ThreePress (3 layer compression wrap) 3 x Per Week/30 Days Discharge Instructions: Apply three layer compression as directed. 1. In office sharp debridement 2. Blast X, collagen and [facility] under 3 layer compression 3. Follow-up in 1 week Electronic Signature(s) Signed: 02/06/2022 10:16:22 AM By: Kalman Shan DO Entered By: Kalman Shan on 02/06/2022 09:58:10 -------------------------------------------------------------------------------- HxROS Details Patient Name: Date of Service: Corey  Romie [name], [place] [name]. 02/06/2022 9:00 A [name] Medical Record Number: [id number] Patient Account Number: [id number] Date of Birth/Sex: Treating RN: March 29, 1946 (76 y.o. 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 hearing loss Eyes Medical History: Positive for: Cataracts Respiratory Medical History: Positive for: Sleep Apnea - has CPAP does not wear Cardiovascular Medical History: Positive for: Congestive Heart Failure; Hypertension; Myocardial Infarction -  2014 Gastrointestinal Medical History: Past Medical History Notes: Diverticulosis Endocrine Medical History: Positive for: Type II Diabetes Time with diabetes: >20 years Treated with: Oral agents, Diet Blood sugar tested every day: Yes Tested : BID Genitourinary Medical History: Past Medical History Notes: Renal insufficiency 20/ Musculoskeletal Medical History: Positive for: Gout Neurologic Medical History: Negative for: Seizure Disorder Oncologic Medical History: Past Medical History Notes: Prostate Ca HBO Extended History Items Eyes: Cataracts Immunizations Pneumococcal Vaccine: Received Pneumococcal Vaccination: No Implantable Devices No devices added Hospitalization / Surgery History Type of Hospitalization/Surgery 06/2021 inguinal hernia repair cardioversion 09/2020 ventral hernia repair 3/19 Family and Social History Cancer: Yes - Father; Diabetes: Yes - Mother,Father; Heart Disease: Yes - Mother,Siblings; Hypertension: Yes - Mother,Father; Former smoker - quit 1970; Marital Status - Married; Drug Use: No History; Caffeine Use: Moderate - coffee; Financial Concerns: No; Food, Clothing or Shelter Needs: No; Support System Lacking: No; Transportation Concerns: No Electronic Signature(s) Signed: 02/06/2022 10:16:22 AM By: Kalman Shan DO Signed: 02/06/2022 5:50:10 PM By: Deon Pilling RN, BSN Entered By: Kalman Shan on 02/06/2022 09:36:26 -------------------------------------------------------------------------------- SuperBill Details Patient Name: Date of Service: Corey [name], Corey [name]. 02/06/2022 Medical Record Number: [id number] Patient Account Number: [id number] Date of Birth/Sex: Treating RN: March 13, 1946 (76 y.o. 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 with other specified severity E11.622 Type 2 diabetes mellitus with other skin ulcer I50.42 Chronic combined systolic (congestive) and diastolic (congestive) heart failure I25.119 Atherosclerotic heart disease of native coronary artery with unspecified angina pectoris I87.311 Chronic venous hypertension (idiopathic) with ulcer of right lower extremity Facility Procedures CPT4 Code: 47425956 Description: 38756 - DEB SUBQ TISSUE 20 SQ CM/< ICD-10 Diagnosis Description L97.918 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 with other spe Modifier: cified severity Quantity: 1 CPT4 Code: 43329518 Description: 11045 - DEB SUBQ TISS EA ADDL 20CM ICD-10 Diagnosis Description L97.918 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 with other spe Modifier: cified severity Quantity: 3 Physician Procedures : CPT4 Code Description Modifier [id number] 11042 - WC PHYS SUBQ TISS 20 SQ CM ICD-10 Diagnosis Description L97.918 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

## 2022-02-06 NOTE — Telephone Encounter (Signed)
Wife called states that patient is in donut hole and [place] medication has increased to $145. They cannot afford. Please advise

## 2022-02-07 NOTE — Telephone Encounter (Signed)
Wife called back states that she has application and will drop off tomorrow or mail it.

## 2022-02-07 NOTE — Progress Notes (Signed)
Corey [name] ([id number]) Visit Report for 02/06/2022 Arrival Information Details Patient Name: Date of Service: Corey [name]. 02/06/2022 9:00 A M Medical Record Number: [id number] Patient Account Number: [id number] Date of Birth/Sex: Treating RN: [name] (76 y.o. Corey [name] Primary Care Corey [name] Other Clinician: Referring Corey [name]: Treating Corey [name]/Extender: Corey [name] in [name]: 13 Visit Information History Since Last Visit Added or deleted any medications: No Patient Arrived: Ambulatory Any new allergies or adverse reactions: No Arrival Time: 08:56 Had a fall or experienced change in No Accompanied By: self activities of daily living that may affect Transfer Assistance: None risk of falls: Patient Identification Verified: Yes Signs or symptoms of abuse/neglect since last visito No Secondary Verification Process Completed: Yes Hospitalized since last visit: No Patient Requires Transmission-Based No Implantable device outside of the clinic excluding No Precautions: cellular tissue based products placed in the center Patient Has Alerts: Yes since last visit: Patient Alerts: in clinic R [place] Has Compression in Place as Prescribed: Yes 11/17/2021 ABIs: both [place] Pain Present Now: No 11/17/2021 TBI: R0.64 L0.58 Electronic Signature(s) [name]: 02/07/2022 4:43:35 PM By: Corey [name] Entered By: Corey [name] on 02/06/2022 08:56:44 -------------------------------------------------------------------------------- Compression Therapy Details Patient Name: Date of Service: Corey [name], [name] 02/06/2022 9:00 A M Medical Record Number: [id number] Patient Account Number: [id number] Date of Birth/Sex: Treating RN: [birth year] [name] (76 y.o. Corey [name] Primary Care Lacora Folmer: Corey [name] Other Clinician: Referring Blimi Godby: Treating Corey [name] in [name]: 13 Compression Therapy Performed for  Wound Assessment: Wound #1 Right,Lateral Lower Leg Performed By: Clinician Corey Pilling, RN Compression Type: Three Layer Post Procedure Diagnosis Same as Pre-procedure Electronic Signature(s) [name]: 02/06/2022 5:50:10 PM By: Corey [name] Entered By: Corey [name] on 02/06/2022 09:26:56 -------------------------------------------------------------------------------- Encounter Discharge Information Details Patient Name: Date of Service: Corey [name], [name] 02/06/2022 9:00 A M Medical Record Number: [id number] Patient Account Number: [id number] Date of Birth/Sex: Treating RN: [birth year] (76 y.o. Corey [name] Primary Care Corey [name]: Corey [name] Other Clinician: Referring Corey [name]: Treating Corey [name] in [name]: 42 Encounter Discharge Information Items Post Procedure Vitals Discharge Condition: Stable Temperature (F): 97.8 Ambulatory Status: Ambulatory Pulse (bpm): 58 Discharge Destination: Home Respiratory Rate (breaths/min): 18 Transportation: Private Auto Blood Pressure (mmHg): 141/83 Accompanied By: self Schedule Follow-up Appointment: Yes Clinical Summary of Care: Electronic Signature(s) [name]: 02/06/2022 5:50:10 PM By: Corey [name] Entered By: Corey [name] on 02/06/2022 09:28:16 -------------------------------------------------------------------------------- Lower Extremity Assessment Details Patient Name: Date of Service: Corey [name], [name] 02/06/2022 9:00 A M Medical Record Number: [id number] Patient Account Number: [id number] Date of Birth/Sex: Treating RN: [name] (76 y.o. Corey [name] Primary Care Corey [name]: Corey [name] Other Clinician: Referring Corey [name]: Treating Corey [name]/Extender: Corey [name]: 13 Edema Assessment Assessed: [Left: No] [Right: No] Edema: [Left: Ye] [Right: s] Calf Left: Right: Point of Measurement: 33 cm From Medial Instep 38.5  cm Ankle Left: Right: Point of Measurement: 11 cm From Medial Instep 22 cm Electronic Signature(s) [name]: 02/06/2022 5:50:10 PM By: Corey [name] [name]: 02/07/2022 4:43:35 PM By: Corey [name] Entered By: Corey [name] on 02/06/2022 09:11:18 -------------------------------------------------------------------------------- Multi Wound Chart Details Patient Name: Date of Service: Corey [name], [name] 02/06/2022 9:00 A M Medical Record Number: [id number] Patient Account Number: [id number] Date of Birth/Sex: Treating RN: Corey [name] (76 y.o. Corey [name] Primary Care Jaquavion Mccannon: Corey [name] Other Clinician: Referring Karam Dunson: Treating Kabria Hetzer/Extender:  Corey [name]: 13 Vital Signs Height(in): 66 Pulse(bpm): 51 Weight(lbs): 145 Blood Pressure(mmHg): 141/83 Body Mass Index(BMI): 23.4 Temperature(F): 97.8 Respiratory Rate(breaths/min): 18 Photos: [N/A:N/A] Right, Lateral Lower Leg N/A N/A Wound Location: Gradually Appeared N/A N/A Wounding Event: Diabetic Wound/Ulcer of the Lower N/A N/A Primary Etiology: Extremity Cataracts, Sleep Apnea, Congestive N/A N/A Comorbid History: Heart Failure, Hypertension, Myocardial Infarction, Type II Diabetes, Gout 08/15/2021 N/A N/A Date Acquired: 13 N/A N/A Weeks of [name]: Open N/A N/A Wound Status: No N/A N/A Wound Recurrence: 9.3x7x0.1 N/A N/A Measurements L x W x D (cm) 51.129 N/A N/A A (cm) : rea 5.113 N/A N/A Volume (cm) : -136.70% N/A N/A % Reduction in A rea: -136.70% N/A N/A % Reduction in Volume: Grade 2 N/A N/A Classification: Medium N/A N/A Exudate A mount: Serosanguineous N/A N/A Exudate Type: red, brown N/A N/A Exudate Color: Distinct, outline attached N/A N/A Wound Margin: Large (67-100%) N/A N/A Granulation A mount: Red, Pink N/A N/A Granulation Quality: Small (1-33%) N/A N/A Necrotic A mount: Fat Layer (Subcutaneous Tissue): Yes N/A N/A Exposed  Structures: Fascia: No Tendon: No Muscle: No Joint: No Bone: No Medium (34-66%) N/A N/A Epithelialization: Debridement - Excisional N/A N/A Debridement: Pre-procedure Verification/Time Out 09:20 N/A N/A Taken: Lidocaine 5% topical ointment N/A N/A Pain Control: Subcutaneous, Slough N/A N/A Tissue Debrided: Skin/Subcutaneous Tissue N/A N/A Level: 65.1 N/A N/A Debridement A (sq cm): rea Curette N/A N/A Instrument: Minimum N/A N/A Bleeding: Pressure N/A N/A Hemostasis A chieved: 0 N/A N/A Procedural Pain: 0 N/A N/A Post Procedural Pain: Procedure was tolerated well N/A N/A Debridement [name] Response: 9.3x7x0.1 N/A N/A Post Debridement Measurements L x W x D (cm) 5.113 N/A N/A Post Debridement Volume: (cm) Compression Therapy N/A N/A Procedures Performed: Debridement [name] Notes Wound #1 (Lower Leg) Wound Laterality: Right, Lateral Cleanser Soap and Water Discharge Instruction: May shower and wash wound with dial antibacterial soap and water prior to dressing change. Wound Cleanser Discharge Instruction: Cleanse the wound with wound cleanser prior to applying a clean dressing using gauze sponges, not tissue or cotton balls. Peri-Wound Care Topical collagen and blastx Discharge Instruction: apply over the topical compounding antibiotics. [facility]-topical compounding antibiotics Discharge Instruction: apply directly to wound bed. Primary Dressing Fibracol Plus Dressing, 4x4.38 in (collagen) Discharge Instruction: Moisten collagen with saline or hydrogel until the blastx and powder collagen arrive. Secondary Dressing ABD Pad, 8x10 Discharge Instruction: Apply over primary dressing as directed. Woven Gauze Sponge, Non-Sterile 4x4 in Discharge Instruction: Apply over primary dressing as directed. Secured With Compression Wrap ThreePress (3 layer compression wrap) Discharge Instruction: Apply three layer compression as directed. Compression  Stockings Add-Ons Electronic Signature(s) [name]: 02/06/2022 10:16:22 AM By: Kalman Shan DO [name]: 02/06/2022 5:50:10 PM By: Corey [name] Entered By: Kalman Shan on 02/06/2022 09:35:47 -------------------------------------------------------------------------------- Multi-Disciplinary Care Plan Details Patient Name: Date of Service: Corey [name], [name] 02/06/2022 9:00 A M Medical Record Number: [id number] Patient Account Number: [id number] Date of Birth/Sex: Treating RN: [birth year] (76 y.o. Corey [name] Primary Care Blayton Huttner: Corey [name] Other Clinician: Referring Mateusz Neilan: Treating Mizael Sagar/Extender: Corey [name] in [name]: 13 Active Inactive Necrotic Tissue Nursing Diagnoses: Knowledge deficit related to management of necrotic/devitalized tissue Goals: Necrotic/devitalized tissue will be minimized in the wound bed Date Initiated: 11/01/2021 Target Resolution Date: 03/03/2022 Goal Status: Active Patient/caregiver will verbalize understanding of reason and process for debridement of necrotic tissue Date Initiated: 11/01/2021 Date Inactivated: 12/05/2021 Target Resolution Date: 12/09/2021 Goal Status: Met Interventions:  Assess patient pain level pre-, during and post procedure and prior to discharge Provide education on necrotic tissue and debridement process [name] Activities: Apply topical anesthetic as ordered : 11/01/2021 Enzymatic debridement : 11/01/2021 Excisional debridement : 11/01/2021 Notes: Pain, Acute or Chronic Nursing Diagnoses: Pain, acute or chronic: actual or potential Potential alteration in comfort, pain Goals: Patient will verbalize adequate pain control and receive pain control interventions during procedures as needed Date Initiated: 11/01/2021 Date Inactivated: 11/21/2021 Target Resolution Date: 12/09/2021 Goal Status: Met Patient/caregiver will verbalize comfort level met Date Initiated:  11/01/2021 Target Resolution Date: 03/03/2022 Goal Status: Active Interventions: Complete pain assessment as per visit requirements Provide education on pain management [name] Activities: Administer pain control measures as ordered : 11/01/2021 Notes: Electronic Signature(s) [name]: 02/06/2022 5:50:10 PM By: Corey [name] Entered By: Corey [name] on 02/06/2022 09:24:41 -------------------------------------------------------------------------------- Pain Assessment Details Patient Name: Date of Service: Corey [name], [name] 02/06/2022 9:00 A M Medical Record Number: [id number] Patient Account Number: [id number] Date of Birth/Sex: Treating RN: [birth year][name] (76 y.o. Corey [name] Primary Care Emmalyn Hinson: Corey [name] Other Clinician: Referring Tyquarius Paglia: Treating Dorothey Oetken/Extender: Corey [name] in [name]: 13 Active Problems Location of Pain Severity and Description of Pain Patient Has Paino No Site Locations Pain Management and Medication Current Pain Management: Electronic Signature(s) [name]: 02/06/2022 5:50:10 PM By: Corey [name] [name]: 02/07/2022 4:43:35 PM By: Corey [name] Entered By: Corey [name] on 02/06/2022 09:00:03 -------------------------------------------------------------------------------- Patient/Caregiver Education Details Patient Name: Date of Service: Corey [name] 9/25/2023andnbsp9:00 A M Medical Record Number: [id number] Patient Account Number: [id number] Date of Birth/Gender: Treating RN: [birth year][name] (76 y.o. Corey [name] Primary Care Physician: Corey [name] Other Clinician: Referring Physician: Treating Physician/Extender: Corey [name] in [name]: 13 Education Assessment Education Provided To: Patient Education Topics Provided Pain: Methods: Explain/Verbal Responses: State content correctly [employer]) [name]: 02/06/2022 5:50:10 PM By: Corey Pilling RN,  Corey [name] Entered By: Corey [name] on 02/06/2022 09:24:52 -------------------------------------------------------------------------------- Wound Assessment Details Patient Name: Date of Service: Corey [name], [name] 02/06/2022 9:00 A M Medical Record Number: [id number] Patient Account Number: [id number] Date of Birth/Sex: Treating RN: [name] (76 y.o. Corey [name] Primary Care Tujuana Kilmartin: Corey [name] Other Clinician: Referring Adeleigh Barletta: Treating Julio Storr/Extender: Corey [name]: 13 Wound Status Wound Number: 1 Primary Diabetic Wound/Ulcer of the Lower Extremity Etiology: Wound Location: Right, Lateral Lower Leg Wound Open Wounding Event: Gradually Appeared Status: Date Acquired: 08/15/2021 Comorbid Cataracts, Sleep Apnea, Congestive Heart Failure, Hypertension, Weeks Of [name]: 13 History: Myocardial Infarction, Type II Diabetes, Gout Clustered Wound: No Photos Wound Measurements Length: (cm) 9.3 Width: (cm) 7 Depth: (cm) 0.1 Area: (cm) 51.129 Volume: (cm) 5.113 % Reduction in Area: -136.7% % Reduction in Volume: -136.7% Epithelialization: Medium (34-66%) Wound Description Classification: Grade 2 Wound Margin: Distinct, outline attached Exudate Amount: Medium Exudate Type: Serosanguineous Exudate Color: red, brown Foul Odor After Cleansing: No Slough/Fibrino Yes Wound Bed Granulation Amount: Large (67-100%) Exposed Structure Granulation Quality: Red, Pink Fascia Exposed: No Necrotic Amount: Small (1-33%) Fat Layer (Subcutaneous Tissue) Exposed: Yes Necrotic Quality: Adherent Slough Tendon Exposed: No Muscle Exposed: No Joint Exposed: No Bone Exposed: No [name] Notes Wound #1 (Lower Leg) Wound Laterality: Right, Lateral Cleanser Soap and Water Discharge Instruction: May shower and wash wound with dial antibacterial soap and water prior to dressing change. Wound Cleanser Discharge Instruction: Cleanse the wound with  wound cleanser prior to applying a clean dressing using gauze sponges, not  tissue or cotton balls. Peri-Wound Care Topical collagen and blastx Discharge Instruction: apply over the topical compounding antibiotics. [facility]-topical compounding antibiotics Discharge Instruction: apply directly to wound bed. Primary Dressing Fibracol Plus Dressing, 4x4.38 in (collagen) Discharge Instruction: Moisten collagen with saline or hydrogel until the blastx and powder collagen arrive. Secondary Dressing ABD Pad, 8x10 Discharge Instruction: Apply over primary dressing as directed. Woven Gauze Sponge, Non-Sterile 4x4 in Discharge Instruction: Apply over primary dressing as directed. Secured With Compression Wrap ThreePress (3 layer compression wrap) Discharge Instruction: Apply three layer compression as directed. Compression Stockings Add-Ons Electronic Signature(s) [name]: 02/06/2022 5:50:10 PM By: Corey [name] [name]: 02/07/2022 4:43:35 PM By: Corey [name] Entered By: Corey [name] on 02/06/2022 09:15:29 -------------------------------------------------------------------------------- Vitals Details Patient Name: Date of Service: Corey [name], [name] 02/06/2022 9:00 A M Medical Record Number: [id number] Patient Account Number: [id number] Date of Birth/Sex: Treating RN: [birth year] (76 y.o. Corey [name] Primary Care Mallie Linnemann: Corey [name] Other Clinician: Referring Ciro Tashiro: Treating Arryn Terrones/Extender: Corey [name] in [name]: 13 Vital Signs Time Taken: 08:56 Temperature (F): 97.8 Height (in): 66 Pulse (bpm): 58 Weight (lbs): 145 Respiratory Rate (breaths/min): 18 Body Mass Index (BMI): 23.4 Blood Pressure (mmHg): 141/83 Reference Range: 80 - 120 mg / dl Electronic Signature(s) [name]: 02/07/2022 4:43:35 PM By: Corey [name] Entered By: Corey [name] on 02/06/2022 08:58:44

## 2022-02-09 ENCOUNTER — Other Ambulatory Visit: Payer: Medicare HMO

## 2022-02-09 DIAGNOSIS — N185 Chronic kidney disease, stage 5: Secondary | ICD-10-CM | POA: Diagnosis not present

## 2022-02-09 NOTE — Telephone Encounter (Signed)
Patient dropped off application - place in MD off at front desk

## 2022-02-10 NOTE — Telephone Encounter (Signed)
Angel from [place] called back and said that patient has had weight gain, he went up a couple of pounds this week to 161, She said he had increased adema in right foot and also in left hand. She said his heart rate was fine, he had a little increase in shortness of breath as well. She wanted to know if there was any advisement. Callback is [phone] (Secure line)

## 2022-02-13 ENCOUNTER — Encounter (HOSPITAL_BASED_OUTPATIENT_CLINIC_OR_DEPARTMENT_OTHER): Payer: Medicare HMO | Admitting: Internal Medicine

## 2022-02-13 DIAGNOSIS — I130 Hypertensive heart and chronic kidney disease with heart failure and stage 1 through stage 4 chronic kidney disease, or unspecified chronic kidney disease: Secondary | ICD-10-CM | POA: Diagnosis not present

## 2022-02-13 DIAGNOSIS — N184 Chronic kidney disease, stage 4 (severe): Secondary | ICD-10-CM | POA: Diagnosis not present

## 2022-02-13 DIAGNOSIS — I3139 Other pericardial effusion (noninflammatory): Secondary | ICD-10-CM | POA: Diagnosis not present

## 2022-02-13 DIAGNOSIS — E8581 Light chain (AL) amyloidosis: Secondary | ICD-10-CM | POA: Diagnosis not present

## 2022-02-13 DIAGNOSIS — N2581 Secondary hyperparathyroidism of renal origin: Secondary | ICD-10-CM | POA: Diagnosis not present

## 2022-02-13 DIAGNOSIS — I5042 Chronic combined systolic (congestive) and diastolic (congestive) heart failure: Secondary | ICD-10-CM | POA: Diagnosis not present

## 2022-02-13 DIAGNOSIS — E1122 Type 2 diabetes mellitus with diabetic chronic kidney disease: Secondary | ICD-10-CM | POA: Diagnosis not present

## 2022-02-13 DIAGNOSIS — R809 Proteinuria, unspecified: Secondary | ICD-10-CM | POA: Diagnosis not present

## 2022-02-13 DIAGNOSIS — N281 Cyst of kidney, acquired: Secondary | ICD-10-CM | POA: Diagnosis not present

## 2022-02-13 NOTE — Telephone Encounter (Signed)
Agree with lowering heart rate parameter to 50.  He should be taking the torsemide today.  He can take an extra pill today in the early afternoon or tomorrow morning.  And then continue with his regular schedule of 3 times a week.

## 2022-02-13 NOTE — Telephone Encounter (Signed)
Spoke with wife and instructions given.  Spoke with [employer] as well. Stated patient had a pound weight gain and SOB.Told her I would check in with spouse today.  Upon speaking with spouse he was seen by Kidney doc today.  Said patient's SOB had improved and he only had it with walking long distance.

## 2022-02-15 NOTE — Telephone Encounter (Signed)
I do not have application. Wife will come to office to complete.

## 2022-02-16 ENCOUNTER — Telehealth: Payer: Self-pay

## 2022-02-16 NOTE — Telephone Encounter (Signed)
**Note De-Identified  Obfuscation** The pt left his completed [employer] application for Eliquis at the office.  I have completed the providers page of his application and have e-mailed all to Dr Kingsley Plan nurse so she can obtain his signature, date it, and to fax all to [facility] at the fax number written on the cover letter included.

## 2022-02-17 ENCOUNTER — Encounter (HOSPITAL_BASED_OUTPATIENT_CLINIC_OR_DEPARTMENT_OTHER): Payer: Medicare HMO | Attending: Internal Medicine | Admitting: Internal Medicine

## 2022-02-17 DIAGNOSIS — Z87891 Personal history of nicotine dependence: Secondary | ICD-10-CM | POA: Insufficient documentation

## 2022-02-17 DIAGNOSIS — R6889 Other general symptoms and signs: Secondary | ICD-10-CM | POA: Diagnosis not present

## 2022-02-17 DIAGNOSIS — I130 Hypertensive heart and chronic kidney disease with heart failure and stage 1 through stage 4 chronic kidney disease, or unspecified chronic kidney disease: Secondary | ICD-10-CM | POA: Diagnosis not present

## 2022-02-17 DIAGNOSIS — N184 Chronic kidney disease, stage 4 (severe): Secondary | ICD-10-CM | POA: Insufficient documentation

## 2022-02-17 DIAGNOSIS — I87311 Chronic venous hypertension (idiopathic) with ulcer of right lower extremity: Secondary | ICD-10-CM | POA: Insufficient documentation

## 2022-02-17 DIAGNOSIS — E1122 Type 2 diabetes mellitus with diabetic chronic kidney disease: Secondary | ICD-10-CM | POA: Insufficient documentation

## 2022-02-17 DIAGNOSIS — E11622 Type 2 diabetes mellitus with other skin ulcer: Secondary | ICD-10-CM | POA: Insufficient documentation

## 2022-02-17 DIAGNOSIS — I5042 Chronic combined systolic (congestive) and diastolic (congestive) heart failure: Secondary | ICD-10-CM | POA: Insufficient documentation

## 2022-02-17 DIAGNOSIS — I25119 Atherosclerotic heart disease of native coronary artery with unspecified angina pectoris: Secondary | ICD-10-CM | POA: Diagnosis not present

## 2022-02-17 DIAGNOSIS — L97918 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 with other specified severity: Secondary | ICD-10-CM | POA: Diagnosis not present

## 2022-02-17 NOTE — Progress Notes (Signed)
Corey [name] ([id number]) Visit Report for 02/17/2022 Chief Complaint Document Details Patient Name: Date of Service: Corey [name] 02/17/2022 8:15 A M Medical Record Number: [id number] Patient Account Number: [id number] Date of Birth/Sex: Treating RN: 1946-02-23 (76 [name].o. M) Primary Care Provider: Billey Gosling Other Clinician: Referring Provider: Treating Provider/Extender: Mickle Asper in Treatment: 15 Information Obtained from: Patient Chief Complaint 11/01/2021; right lower extremity wound Electronic Signature(s) Signed: 02/17/2022 9:13:22 AM By: Kalman Shan DO Entered By: Kalman Shan on 02/17/2022 09:03:39 -------------------------------------------------------------------------------- Debridement Details Patient Name: Date of Service: Corey [name], Corey [name] M. 02/17/2022 8:15 A M Medical Record Number: [id number] Patient Account Number: [id number] Date of Birth/Sex: Treating RN: 06-18-1945 (32 [name].o. [name] Primary Care Provider: Billey Gosling Other Clinician: Referring Provider: Treating Provider/Extender: Mickle Asper in Treatment: 15 Debridement Performed for Assessment: Wound #1 Right,Lateral Lower Leg Performed By: Physician Kalman Shan, DO Debridement Type: Debridement Severity of Tissue Pre Debridement: Fat layer exposed Level of Consciousness (Pre-procedure): Awake and Alert Pre-procedure Verification/Time Out Yes - 08:35 Taken: Start Time: 08:36 Pain Control: Lidocaine 5% topical ointment T Area Debrided (L x W): otal 6 (cm) x 4 (cm) = 24 (cm) Tissue and other material debrided: Viable, Non-Viable, Slough, Subcutaneous, Skin: Dermis , Skin: Epidermis, Slough Level: Skin/Subcutaneous Tissue Debridement Description: Excisional Instrument: Curette Bleeding: Minimum Hemostasis Achieved: Pressure End Time: 08:43 Procedural Pain: 0 Post Procedural Pain: 0 Response to Treatment: Procedure was tolerated  well Level of Consciousness (Post- Awake and Alert procedure): Post Debridement Measurements of Total Wound Length: (cm) 6.6 Width: (cm) 6.4 Depth: (cm) 0.1 Volume: (cm) 3.318 Character of Wound/Ulcer Post Debridement: Improved Severity of Tissue Post Debridement: Fat layer exposed Post Procedure Diagnosis Same as Pre-procedure Electronic Signature(s) Signed: 02/17/2022 9:13:22 AM By: Kalman Shan DO Signed: 02/17/2022 1:39:14 PM By: Deon Pilling RN, BSN Entered By: Deon Pilling on 02/17/2022 08:43:39 -------------------------------------------------------------------------------- HPI Details Patient Name: Date of Service: Corey [name], Corey [name] M. 02/17/2022 8:15 A M Medical Record Number: [id number] Patient Account Number: [id number] Date of Birth/Sex: Treating RN: [birth year] (43 [name].o. M) Primary Care Provider: Billey Gosling Other Clinician: Referring Provider: Treating Provider/Extender: Mickle Asper in Treatment: 15 History of Present Illness HPI Description: Admission 11/01/2021 Corey [name] Is a 76 year old male with a past medical history of of type 2 diabetes, coronary artery disease, stage IV kidney disease acute on chronic combined systolic and diastolic heart failure that presents to the clinic for a 67-monthhistory of right lower extremity wound. He states he is not sure how this started. It is completely eschared. He reports his wound has been stable for the past month. He has been keeping the area covered. He has mild chronic pain. He denies signs of infection. He states he has a history of lower extremity ulcers last being on the left leg. He could not tell me how this was treated in the past. 6/27; patient presents for follow-up. He is scheduled for his lower extremity arterial studies on July 6th. He has been using Medihoney to the wound bed. He has no issues or complaints today. He denies signs of infection. 7/6; patient presents for follow-up.  He has home health that reported the wound having odor and increased pain last week. He was sent to urgent care. He was started on doxycycline. He is continuing Medihoney. He has his scheduled ABIs w/TBIs today. 7/10; patient presents for follow-up. He had his arterial studies done last  week. He had triphasic waveforms throughout the right lower extremity. His TBI was 0.64. He has good pedal pulses. He is almost done with his course of antibiotics. He has been using mupirocin ointment to the wound bed. 7/17; patient presents for follow-up. He has been using Iodosorb under compression therapy. He has no issues or complaints today. He states he is receiving [facility] antibiotic tomorrow In the mail. 7/24; patient presents for follow-up. He has been doing Dakin's wet-to-dry dressings to the wound bed daily without issues. He currently denies signs of infection. He reports completing his course of doxycycline. He has his [facility] antibiotic with him today. 7/31; patient presents for follow-up. We have been using Iodoflex with [facility] under compression therapy. He has no issues or complaints today. He denies signs of infection. 8/7; patient presents for follow-up. We have been using Iodoflex with [facility] under compression therapy. Home health came out once last week to change the wrap. He has no issues or complaints today. He denies signs of infection. 8/14; patient presents for follow-up. We have been using Keystone with Hydrofera Blue under compression therapy. He has tolerated this well. He has no issues or complaints today. 8/21; patient presents for follow-up. We have been using [facility]stone with Hydrofera Blue under compression therapy. The Hydrofera Blue was stuck on tightly to the wound bed today. Other than that he has no issues or complaints. We have not heard back for insurance approval for skin sub. 8/28; patient presents for follow-up. We ordered blast X and collagen powder at last clinic visit.  It is unclear if he has received this. We placed Keystone with Fibracol under 3 layer compression last week. Patient has home health. Patient has no issues or complaints today. He was not approved for TheraSkin. 9/5; patient presents for follow-up. Insurance would not cover blast X. We do have a sample and he would like to try this. We have been using [facility] antibiotic and collagen under 3 layer compression. He has no issues or complaints today. 9/18; patient presents for follow-up. We have been using collagen with blast X and Keystone under compression therapy. He has no issues or complaints today. 9/25; patient presents for follow-up. We have continued to use collagen, blast X and Keystone under compression therapy. Patient has no issues or complaints today. 10/6; patient presents for follow-up. We have been using collagen, blast X and [facility] under compression therapy. He denies signs of infection and has no issues or complaints today. Patient has home health that comes out on Wednesdays and Fridays. Electronic Signature(s) Signed: 02/17/2022 9:13:22 AM By: Kalman Shan DO Entered By: Kalman Shan on 02/17/2022 09:04:07 -------------------------------------------------------------------------------- Physical Exam Details Patient Name: Date of Service: Corey [name], Corey [name] M. 02/17/2022 8:15 A M Medical Record Number: [id number] Patient Account Number: [id number] Date of Birth/Sex: Treating RN: [birth year] (73 [name].o. M) Primary Care Provider: Billey Gosling Other Clinician: Referring Provider: Treating Provider/Extender: Mickle Asper in Treatment: 15 Constitutional respirations regular, non-labored and within target range for patient.. Cardiovascular 2+ dorsalis pedis/posterior tibialis pulses. Psychiatric pleasant and cooperative. Notes Right lower extremity: T the anterior aspect there is granulation tissue and nonviable tissue present. No signs of surrounding  infection. Good edema control. o Electronic Signature(s) Signed: 02/17/2022 9:13:22 AM By: Kalman Shan DO Entered By: Kalman Shan on 02/17/2022 09:04:25 -------------------------------------------------------------------------------- Physician Orders Details Patient Name: Date of Service: Corey [name], Corey [name] M. 02/17/2022 8:15 A M Medical Record Number: [id number] Patient Account Number: [id number] Date of Birth/Sex:  Treating RN: 1946-04-25 (40 [name].o. [name] Primary Care Provider: Billey Gosling Other Clinician: Referring Provider: Treating Provider/Extender: Mickle Asper in Treatment: 15 Verbal / Phone Orders: No Diagnosis Coding ICD-10 Coding Code Description J62.831 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 with other specified severity E11.622 Type 2 diabetes mellitus with other skin ulcer I50.42 Chronic combined systolic (congestive) and diastolic (congestive) heart failure I25.119 Atherosclerotic heart disease of native coronary artery with unspecified angina pectoris I87.311 Chronic venous hypertension (idiopathic) with ulcer of right lower extremity Follow-up Appointments ppointment in 2 weeks. - Dr. Heber  and [place], Room 8 Monday 02/27/2022 Return A Other: - ****[facility] topical compounding antibiotics bring to appt each time.**** Bring in the blastx as well. Anesthetic (In clinic) Topical Lidocaine 5% applied to wound bed Cellular or Tissue Based Products Cellular or Tissue Based Product Type: - run insurance for [employer], epicord, and organogenesis advance tissue product. Theraskin not approved. Epicord not covered. [employer] Apligraf approved. Bathing/ Shower/ Hygiene May shower with protection but do not get wound dressing(s) wet. Edema Control - Lymphedema / SCD / Other Elevate legs to the level of the heart or above for 30 minutes daily and/or when sitting, a frequency of: - 3-4 times a day  throughout the day. Avoid standing for long periods of time. Home Health No change in wound care orders this week; continue Home Health for wound care. May utilize formulary equivalent dressing for wound treatment orders unless otherwise specified. - home health to twice a week. Compounding topical antibiotics directly to wound bed. Then Place collagen and blastx. Use plain collagen over the topical antibiotics. Other Home Health Orders/Instructions: - [facility] Wound Treatment Wound #1 - Lower Leg Wound Laterality: Right, Lateral Cleanser: Soap and Water (Home Health) 3 x Per Week/30 Days Discharge Instructions: May shower and wash wound with dial antibacterial soap and water prior to dressing change. Cleanser: Wound Cleanser (Home Health) 3 x Per Week/30 Days Discharge Instructions: Cleanse the wound with wound cleanser prior to applying a clean dressing using gauze sponges, not tissue or cotton balls. Topical: collagen and blastx (Home Health) 3 x Per Week/30 Days Discharge Instructions: apply over the topical compounding antibiotics. Topical: [facility]-topical compounding antibiotics (Home Health) 3 x Per Week/30 Days Discharge Instructions: apply directly to wound bed. Prim Dressing: Fibracol Plus Dressing, 4x4.38 in (collagen) (Home Health) 3 x Per Week/30 Days ary Discharge Instructions: Moisten collagen with saline or hydrogel until the blastx and powder collagen arrive. Secondary Dressing: ABD Pad, 8x10 (Home Health) 3 x Per Week/30 Days Discharge Instructions: Apply over primary dressing as directed. Secondary Dressing: Woven Gauze Sponge, Non-Sterile 4x4 in (Home Health) 3 x Per Week/30 Days Discharge Instructions: Apply over primary dressing as directed. Compression Wrap: ThreePress (3 layer compression wrap) 3 x Per Week/30 Days Discharge Instructions: Apply three layer compression as directed. Electronic Signature(s) Signed: 02/17/2022 9:13:22 AM By: Kalman Shan DO Entered By: Kalman Shan on 02/17/2022 09:04:31 -------------------------------------------------------------------------------- Problem List Details Patient Name: Date of Service: Corey [name], Corey [name] M. 02/17/2022 8:15 A M Medical Record Number: [id number] Patient Account Number: [id number] Date of Birth/Sex: Treating RN: August 16, 1945 (63 [name].o. [name] Primary Care Provider: Billey Gosling Other Clinician: Referring Provider: Treating Provider/Extender: Mickle Asper in Treatment: 15 Active Problems ICD-10 Encounter Code Description Active Date MDM Diagnosis L97.918 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 with other 11/01/2021 No Yes specified severity E11.622 Type 2  diabetes mellitus with other skin ulcer 11/01/2021 No Yes I50.42 Chronic combined systolic (congestive) and diastolic (congestive) heart failure 11/01/2021 No Yes I25.119 Atherosclerotic heart disease of native coronary artery with unspecified angina 11/01/2021 No Yes pectoris I87.311 Chronic venous hypertension (idiopathic) with ulcer of right lower extremity 12/26/2021 No Yes Inactive Problems Resolved Problems Electronic Signature(s) Signed: 02/17/2022 9:13:22 AM By: Kalman Shan DO Entered By: Kalman Shan on 02/17/2022 09:03:26 -------------------------------------------------------------------------------- Progress Note Details Patient Name: Date of Service: Corey [name], Corey [name] M. 02/17/2022 8:15 A M Medical Record Number: [id number] Patient Account Number: [id number] Date of Birth/Sex: Treating RN: 1946-01-30 (27 [name].o. M) Primary Care Provider: Billey Gosling Other Clinician: Referring Provider: Treating Provider/Extender: Mickle Asper in Treatment: 15 Subjective Chief Complaint Information obtained from Patient 11/01/2021; right lower extremity wound History of Present Illness (HPI) Admission 11/01/2021 [name] Is a  76 year old male with a past medical history of of type 2 diabetes, coronary artery disease, stage IV kidney disease acute on chronic combined systolic and diastolic heart failure that presents to the clinic for a 62-monthhistory of right lower extremity wound. He states he is not sure how this started. It is completely eschared. He reports his wound has been stable for the past month. He has been keeping the area covered. He has mild chronic pain. He denies signs of infection. He states he has a history of lower extremity ulcers last being on the left leg. He could not tell me how this was treated in the past. 6/27; patient presents for follow-up. He is scheduled for his lower extremity arterial studies on July 6th. He has been using Medihoney to the wound bed. He has no issues or complaints today. He denies signs of infection. 7/6; patient presents for follow-up. He has home health that reported the wound having odor and increased pain last week. He was sent to urgent care. He was started on doxycycline. He is continuing Medihoney. He has his scheduled ABIs w/TBIs today. 7/10; patient presents for follow-up. He had his arterial studies done last week. He had triphasic waveforms throughout the right lower extremity. His TBI was 0.64. He has good pedal pulses. He is almost done with his course of antibiotics. He has been using mupirocin ointment to the wound bed. 7/17; patient presents for follow-up. He has been using Iodosorb under compression therapy. He has no issues or complaints today. He states he is receiving [facility] antibiotic tomorrow In the mail. 7/24; patient presents for follow-up. He has been doing Dakin's wet-to-dry dressings to the wound bed daily without issues. He currently denies signs of infection. He reports completing his course of doxycycline. He has his [facility] antibiotic with him today. 7/31; patient presents for follow-up. [facility]. He has no issues or complaints today. He denies signs of infection. 8/7; patient presents for follow-up. [facility] Home health came out once last week to change the wrap. He has no issues or complaints today. He denies signs of infection. 8/14; patient presents for follow-up. We have been using Keystone with Hydrofera Blue under compression therapy. He has tolerated this well. He has no issues or complaints today. 8/21; patient presents for follow-up. We have been using [facility] with Hydrofera Blue under compression therapy. The Hydrofera Blue was stuck on tightly to the wound bed today. Other than that he has no issues or complaints. We have not heard back  for insurance approval for skin sub. 8/28; patient presents for follow-up. We ordered blast X and collagen powder at last clinic visit. It is unclear if he has received this. We placed Keystone with Fibracol under 3 layer compression last week. Patient has home health. Patient has no issues or complaints today. He was not approved for TheraSkin. 9/5; patient presents for follow-up. Insurance would not cover blast X. We do have a sample and he would like to try this. We have been using [facility] antibiotic and collagen under 3 layer compression. He has no issues or complaints today. 9/18; patient presents for follow-up. We have been using collagen with blast X and [facility] under compression therapy. He has no issues or complaints today. 9/25; patient presents for follow-up. We have continued to use collagen, blast X and [facility] under compression therapy. Patient has no issues or complaints today. 10/6; patient presents for follow-up. We have been using collagen, blast X and [facility] under compression therapy. He denies signs of infection and has no issues or complaints today. Patient has home health that comes out on Wednesdays and Fridays. Patient History Family  History Cancer - Father, Diabetes - Mother,Father, Heart Disease - Mother,Siblings, Hypertension - Mother,Father. Social History Former smoker - quit 1970, Marital Status - Married, Drug Use - No History, Caffeine Use - Moderate - coffee. Medical History Eyes Patient has history of Cataracts Respiratory Patient has history of Sleep Apnea - has CPAP does not wear Cardiovascular Patient has history of Congestive Heart Failure, Hypertension, Myocardial Infarction - 2014 Endocrine Patient has history of Type II Diabetes Musculoskeletal Patient has history of Gout Neurologic Denies history of Seizure Disorder Hospitalization/Surgery History - 06/2021 inguinal hernia repair. - cardioversion 09/2020. - ventral hernia repair 3/19. 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 Vitals Time Taken: 8:05 AM, Height: 66 in, Weight: 145 lbs, BMI: 23.4, Temperature: 98.4 F, Pulse: 46 bpm, Respiratory Rate: 20 breaths/min, Blood Pressure: 150/81 mmHg. Cardiovascular 2+ dorsalis pedis/posterior tibialis pulses. Psychiatric pleasant and cooperative. General Notes: Right lower extremity: T the anterior aspect there is granulation tissue and nonviable tissue present. No signs of surrounding infection. Good o edema control. Integumentary (Hair, Skin) Wound #1 status is Open. Original cause of wound was Gradually Appeared. The date acquired was: 08/15/2021. The wound has been in treatment 15 weeks. The wound is located on the Right,Lateral Lower Leg. The wound measures 6.6cm length x 6.4cm width x 0.1cm depth; 33.175cm^2 area and 3.318cm^3 volume. There is Fat Layer (Subcutaneous Tissue) exposed. There is no tunneling or undermining noted. There is a  medium amount of serosanguineous drainage noted. The wound margin is distinct with the outline attached to the wound base. There is large (67-100%) red, pink granulation within the wound bed. There is a small (1-33%) amount of necrotic tissue within the wound bed including Adherent Slough. The periwound skin appearance did not exhibit: Callus, Crepitus, Excoriation, Induration, Rash, Scarring, Dry/Scaly, Maceration, Atrophie Blanche, Cyanosis, Ecchymosis, Hemosiderin Staining, Mottled, Pallor, Rubor, Erythema. Periwound temperature was noted as No Abnormality. The periwound has tenderness on palpation. 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 Type 2 diabetes mellitus with other skin ulcer Chronic combined systolic (congestive) and diastolic (congestive) heart failure Atherosclerotic heart disease of native coronary artery with unspecified angina pectoris Chronic venous  hypertension (idiopathic) with ulcer of right lower extremity Patient's wound has shown improvement in size and appearance since last clinic visit. I debrided nonviable tissue. I recommended continuing the course with blast X, collagen and [facility] antibiotic ointment under compression therapy. I will see him back in 2 weeks So he can return to his typical Monday schedule. Procedures Wound #1 Pre-procedure diagnosis of Wound #1 is a Diabetic Wound/Ulcer of the Lower Extremity located on the Right,Lateral Lower Leg .Severity of Tissue Pre Debridement is: Fat layer exposed. There was a Excisional Skin/Subcutaneous Tissue Debridement with a total area of 24 sq cm performed by Kalman Shan, DO. With the following instrument(s): Curette to remove Viable and Non-Viable tissue/material. Material removed includes Subcutaneous Tissue, Slough, Skin: Dermis, and Skin: Epidermis after achieving pain control using Lidocaine 5% topical ointment. A time  out was conducted at 08:35, prior to the start of the procedure. A Minimum amount of bleeding was controlled with Pressure. The procedure was tolerated well with a pain level of 0 throughout and a pain level of 0 following the procedure. Post Debridement Measurements: 6.6cm length x 6.4cm width x 0.1cm depth; 3.318cm^3 volume. Character of Wound/Ulcer Post Debridement is improved. Severity of Tissue Post Debridement is: Fat layer exposed. Post procedure Diagnosis Wound #1: Same as Pre-Procedure Pre-procedure diagnosis of Wound #1 is a Diabetic Wound/Ulcer of the Lower Extremity located on the Right,Lateral Lower Leg . There was a Three Layer Compression Therapy Procedure by Deon Pilling, RN. Post procedure Diagnosis Wound #1: Same as Pre-Procedure Plan Follow-up Appointments: Return Appointment in 2 weeks. - Dr. Heber [place] and [place], Room 8 Monday 02/27/2022 Other: - ****[facility] topical compounding antibiotics bring to appt each time.**** Bring in the blastx as well. Anesthetic: (In clinic) Topical Lidocaine 5% applied to wound bed Cellular or Tissue Based Products: Cellular or Tissue Based Product Type: - run insurance for [employer], epicord, and organogenesis advance tissue product. Theraskin not approved. Epicord not covered. [employer] Apligraf approved. Bathing/ Shower/ Hygiene: May shower with protection but do not get wound dressing(s) wet. Edema Control - Lymphedema / SCD / Other: Elevate legs to the level of the heart or above for 30 minutes daily and/or when sitting, a frequency of: - 3-4 times a day throughout the day. Avoid standing for long periods of time. Home Health: No change in wound care orders this week; continue Home Health for wound care. May utilize formulary equivalent dressing for wound treatment orders unless otherwise specified. - home health to twice a week. Compounding topical antibiotics directly to wound bed. Then Place collagen and blastx. Use plain  collagen over the topical antibiotics. Other Home Health Orders/Instructions: - [facility] WOUND #1: - Lower Leg Wound Laterality: Right, Lateral Cleanser: Soap and Water (Home Health) 3 x Per Week/30 Days Discharge Instructions: May shower and wash wound with dial antibacterial soap and water prior to dressing change. Cleanser: Wound Cleanser (Home Health) 3 x Per Week/30 Days Discharge Instructions: Cleanse the wound with wound cleanser prior to applying a clean dressing using gauze sponges, not tissue or cotton balls. Topical: collagen and blastx (Home Health) 3 x Per Week/30 Days Discharge Instructions: apply over the topical compounding antibiotics. Topical: [facility]-topical compounding antibiotics (Home Health) 3 x Per Week/30 Days Discharge Instructions: apply directly to wound bed. Prim Dressing: Fibracol Plus Dressing, 4x4.38 in (collagen) (Home Health) 3 x Per Week/30 Days ary Discharge Instructions: Moisten collagen with saline or hydrogel until the blastx and powder collagen arrive. Secondary Dressing: ABD Pad,  8x10 (Home Health) 3 x Per Week/30 Days Discharge Instructions: Apply over primary dressing as directed. Secondary Dressing: Woven Gauze Sponge, Non-Sterile 4x4 in (Home Health) 3 x Per Week/30 Days Discharge Instructions: Apply over primary dressing as directed. Com pression Wrap: ThreePress (3 layer compression wrap) 3 x Per Week/30 Days Discharge Instructions: Apply three layer compression as directed. 1. In office sharp debridement 2. Collagen, blast X and [facility] antibiotic ointment under 2 layer Coflex 3. Follow-up in 2 weeks Electronic Signature(s) Signed: 02/17/2022 9:13:22 AM By: Kalman Shan DO Entered By: Kalman Shan on 02/17/2022 09:05:53 -------------------------------------------------------------------------------- HxROS Details Patient Name: Date of Service: Corey [name], Corey [name] M. 02/17/2022 8:15 A M Medical Record Number:  [id number] Patient Account Number: [id number] Date of Birth/Sex: Treating RN: 02-25-1946 (57 [name].o. M) Primary Care Provider: Billey Gosling Other Clinician: Referring Provider: Treating Provider/Extender: [name] in Treatment: 15 Constitutional Symptoms (General Health) Medical History: Past Medical History Notes: CVA 2014 bilateral hearing loss Eyes Medical History: Positive for: Cataracts Respiratory Medical History: Positive for: Sleep Apnea - has CPAP does not wear Cardiovascular Medical History: Positive for: Congestive Heart Failure; Hypertension; Myocardial Infarction - 2014 Gastrointestinal Medical History: Past Medical History Notes: Diverticulosis Endocrine Medical History: Positive for: Type II Diabetes Time with diabetes: >20 years Treated with: Oral agents, Diet Blood sugar tested every day: Yes Tested : BID Genitourinary Medical History: Past Medical History Notes: Renal insufficiency 20/ Musculoskeletal Medical History: Positive for: Gout Neurologic Medical History: Negative for: Seizure Disorder Oncologic Medical History: Past Medical History Notes: Prostate Ca HBO Extended History Items Eyes: Cataracts Immunizations Pneumococcal Vaccine: Received Pneumococcal Vaccination: No Implantable Devices No devices added Hospitalization / Surgery History Type of Hospitalization/Surgery 06/2021 inguinal hernia repair cardioversion 09/2020 ventral hernia repair 3/19 Family and Social History Cancer: Yes - Father; Diabetes: Yes - Mother,Father; Heart Disease: Yes - Mother,Siblings; Hypertension: Yes - Mother,Father; Former smoker - quit 1970; Marital Status - Married; Drug Use: No History; Caffeine Use: Moderate - coffee; Financial Concerns: No; Food, Clothing or Shelter Needs: No; Support System Lacking: No; Transportation Concerns: No Electronic Signature(s) Signed: 02/17/2022 9:13:22 AM By: Kalman Shan DO Entered By:  Kalman Shan on 02/17/2022 09:04:11 -------------------------------------------------------------------------------- SuperBill Details Patient Name: Date of Service: Corey [name], Corey [name] M. 02/17/2022 Medical Record Number: [id number] Patient Account Number: [id number] Date of Birth/Sex: Treating RN: November 19, 1945 (59 [name].o. 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 E11.622 Type 2 diabetes mellitus with other skin ulcer I50.42 Chronic combined systolic (congestive) and diastolic (congestive) heart failure I25.119 Atherosclerotic heart disease of native coronary artery with unspecified angina pectoris I87.311 Chronic venous hypertension (idiopathic) with ulcer of right lower extremity Facility Procedures CPT4 Code: 41660630 Description: 16010 - DEB SUBQ TISSUE 20 SQ CM/< ICD-10 Diagnosis Description L97.918 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 with other spe Modifier: cified severity Quantity: 1 CPT4 Code: [id number] Description: 11045 - DEB SUBQ TISS EA ADDL 20CM ICD-10 Diagnosis Description L97.918 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 with other spe Modifier: cified severity Quantity: 1 Physician Procedures : CPT4 Code Description Modifier [id number] 11042 - WC PHYS SUBQ TISS 20 SQ CM 1 ICD-10 Diagnosis Description L97.918 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

## 2022-02-17 NOTE — Progress Notes (Signed)
Corey, [name] ([id number]) Visit Report for 02/17/2022 Arrival Information Details Patient Name: Date of Service: Corey [name] 02/17/2022 8:15 A M Medical Record Number: [id number] Patient Account Number: [id number] Date of Birth/Sex: Treating RN: 21-Aug-1945 (76 y.o. Corey [name] Primary Care Jaylenn Baiza: Billey Gosling Other Clinician: Referring Lisamarie Coke: Treating Denay Pleitez/Extender: Mickle Asper in Treatment: 15 Visit Information History Since Last Visit Added or deleted any medications: No Patient Arrived: Ambulatory Any new allergies or adverse reactions: No Arrival Time: 08:06 Had a fall or experienced change in No Accompanied By: self activities of daily living that may affect Transfer Assistance: None risk of falls: Patient Identification Verified: Yes Signs or symptoms of abuse/neglect since last visito No Secondary Verification Process Completed: Yes Hospitalized since last visit: No Patient Requires Transmission-Based No Implantable device outside of the clinic excluding No Precautions: cellular tissue based products placed in the center Patient Has Alerts: Yes since last visit: Patient Alerts: in clinic R [place] Has Dressing in Place as Prescribed: Yes 11/17/2021 ABIs: both [place] Has Compression in Place as Prescribed: Yes 11/17/2021 TBI: R0.64 L0.58 Pain Present Now: No Electronic Signature(s) Signed: 02/17/2022 1:39:14 PM By: Deon Pilling RN, BSN Entered By: Deon Pilling on 02/17/2022 08:06:34 -------------------------------------------------------------------------------- Compression Therapy Details Patient Name: Date of Service: [name], [name] 02/17/2022 8:15 A M Medical Record Number: [id number] Patient Account Number: [id number] Date of Birth/Sex: Treating RN: March 23, 1946 (76 y.o. Corey [name] Primary Care Ramses Klecka: Billey Gosling Other Clinician: Referring Derryl Uher: Treating Zaccheus Edmister/Extender: [name] in  Treatment: 15 Compression Therapy Performed for Wound Assessment: Wound #1 Right,Lateral Lower Leg Performed By: Clinician Deon Pilling, RN Compression Type: Three Layer Post Procedure Diagnosis Same as Pre-procedure Electronic Signature(s) Signed: 02/17/2022 1:39:14 PM By: Deon Pilling RN, BSN Entered By: Deon Pilling on 02/17/2022 08:18:40 -------------------------------------------------------------------------------- Encounter Discharge Information Details Patient Name: Date of Service: [name], [name] 02/17/2022 8:15 A M Medical Record Number: [id number] Patient Account Number: [id number] Date of Birth/Sex: Treating RN: Oct 14, 1945 (76 y.o. Corey [name] Primary Care Haislee Corso: Billey Gosling Other Clinician: Referring [name]: Treating [name]/Extender: Mickle Asper in Treatment: 15 Encounter Discharge Information Items Post Procedure Vitals Discharge Condition: Stable Temperature (F): 98.4 Ambulatory Status: Ambulatory Pulse (bpm): 46 Discharge Destination: Home Respiratory Rate (breaths/min): 20 Transportation: Private Auto Blood Pressure (mmHg): 150/81 Accompanied By: self Schedule Follow-up Appointment: Yes Clinical Summary of Care: Electronic Signature(s) Signed: 02/17/2022 1:39:14 PM By: Deon Pilling RN, BSN Entered By: Deon Pilling on 02/17/2022 08:45:46 -------------------------------------------------------------------------------- Lower Extremity Assessment Details Patient Name: Date of Service: [name], [name] 02/17/2022 8:15 A M Medical Record Number: [id number] Patient Account Number: [id number] Date of Birth/Sex: Treating RN: 1945-08-25 (76 y.o. Corey [name] Primary Care Corey [name]: Billey Gosling Other Clinician: Referring Keven Soucy: Treating Loriel Diehl/Extender: [name] in Treatment: 15 Edema Assessment Assessed: [Left: No] [Right: No] Edema: [Left: Ye] [Right: s] Calf Left: Right: Point  of Measurement: 33 cm From Medial Instep 33.5 cm Ankle Left: Right: Point of Measurement: 11 cm From Medial Instep 20.5 cm Vascular Assessment Pulses: Dorsalis Pedis Palpable: [Right:Yes] Posterior Tibial Palpable: [Right:Yes] Electronic Signature(s) Signed: 02/17/2022 1:39:14 PM By: Deon Pilling RN, BSN Entered By: Deon Pilling on 02/17/2022 08:09:40 -------------------------------------------------------------------------------- Multi Wound Chart Details Patient Name: Date of Service: [name], [name] 02/17/2022 8:15 A M Medical Record Number: [id number] Patient Account Number: [id number] Date of Birth/Sex: Treating RN: 30-Dec-1945 (76 y.o. M)  Primary Care Nocole [name]: Billey Gosling Other Clinician: Referring Lus Kriegel: Treating [name]/Extender: Mickle Asper in Treatment: 15 Vital Signs Height(in): 66 Pulse(bpm): 46 Weight(lbs): 145 Blood Pressure(mmHg): 150/81 Body Mass Index(BMI): 23.4 Temperature(F): 98.4 Respiratory Rate(breaths/min): 20 Photos: [N/A:N/A] Right, Lateral Lower Leg N/A N/A Wound Location: Gradually Appeared N/A N/A Wounding Event: Diabetic Wound/Ulcer of the Lower N/A N/A Primary Etiology: Extremity Cataracts, Sleep Apnea, Congestive N/A N/A Comorbid History: Heart Failure, Hypertension, Myocardial Infarction, Type II Diabetes, Gout 08/15/2021 N/A N/A Date Acquired: 15 N/A N/A Weeks of Treatment: Open N/A N/A Wound Status: No N/A N/A Wound Recurrence: 6.6x6.4x0.1 N/A N/A Measurements L x W x D (cm) 33.175 N/A N/A A (cm) : rea 3.318 N/A N/A Volume (cm) : -53.60% N/A N/A % Reduction in A rea: -53.60% N/A N/A % Reduction in Volume: Grade 2 N/A N/A Classification: Medium N/A N/A Exudate A mount: Serosanguineous N/A N/A Exudate Type: red, brown N/A N/A Exudate Color: Distinct, outline attached N/A N/A Wound Margin: Large (67-100%) N/A N/A Granulation A mount: Red, Pink N/A N/A Granulation  Quality: Small (1-33%) N/A N/A Necrotic A mount: Fat Layer (Subcutaneous Tissue): Yes N/A N/A Exposed Structures: Fascia: No Tendon: No Muscle: No Joint: No Bone: No Medium (34-66%) N/A N/A Epithelialization: Debridement - Excisional N/A N/A Debridement: Pre-procedure Verification/Time Out 08:35 N/A N/A Taken: Lidocaine 5% topical ointment N/A N/A Pain Control: Subcutaneous, Slough N/A N/A Tissue Debrided: Skin/Subcutaneous Tissue N/A N/A Level: 24 N/A N/A Debridement A (sq cm): rea Curette N/A N/A Instrument: Minimum N/A N/A Bleeding: Pressure N/A N/A Hemostasis A chieved: 0 N/A N/A Procedural Pain: 0 N/A N/A Post Procedural Pain: Procedure was tolerated well N/A N/A Debridement Treatment Response: 6.6x6.4x0.1 N/A N/A Post Debridement Measurements L x W x D (cm) 3.318 N/A N/A Post Debridement Volume: (cm) Excoriation: No N/A N/A Periwound Skin Texture: Induration: No Callus: No Crepitus: No Rash: No Scarring: No Maceration: No N/A N/A Periwound Skin Moisture: Dry/Scaly: No Atrophie Blanche: No N/A N/A Periwound Skin Color: Cyanosis: No Ecchymosis: No Erythema: No Hemosiderin Staining: No Mottled: No Pallor: No Rubor: No No Abnormality N/A N/A Temperature: Yes N/A N/A Tenderness on Palpation: Compression Therapy N/A N/A Procedures Performed: Debridement Treatment Notes Wound #1 (Lower Leg) Wound Laterality: Right, Lateral Cleanser Soap and Water Discharge Instruction: May shower and wash wound with dial antibacterial soap and water prior to dressing change. Wound Cleanser Discharge Instruction: Cleanse the wound with wound cleanser prior to applying a clean dressing using gauze sponges, not tissue or cotton balls. Peri-Wound Care Topical collagen and blastx Discharge Instruction: apply over the topical compounding antibiotics. [facility]-topical compounding antibiotics Discharge Instruction: apply directly to wound bed. Primary  Dressing Fibracol Plus Dressing, 4x4.38 in (collagen) Discharge Instruction: Moisten collagen with saline or hydrogel until the blastx and powder collagen arrive. Secondary Dressing ABD Pad, 8x10 Discharge Instruction: Apply over primary dressing as directed. Woven Gauze Sponge, Non-Sterile 4x4 in Discharge Instruction: Apply over primary dressing as directed. Secured With Compression Wrap ThreePress (3 layer compression wrap) Discharge Instruction: Apply three layer compression as directed. Compression Stockings Add-Ons Electronic Signature(s) Signed: 02/17/2022 9:13:22 AM By: Kalman Shan DO Entered By: Kalman Shan on 02/17/2022 09:03:33 -------------------------------------------------------------------------------- Multi-Disciplinary Care Plan Details Patient Name: Date of Service: [name], [name] 02/17/2022 8:15 A M Medical Record Number: [id number] Patient Account Number: [id number] Date of Birth/Sex: Treating RN: December 18, 1945 (76 y.o. Corey [name] Primary Care Arlissa Monteverde: Other Clinician: Billey Gosling Referring Warden Buffa: Treating Ada Woodbury/Extender: [name] in Treatment:  15 Active Inactive Necrotic Tissue Nursing Diagnoses: Knowledge deficit related to management of necrotic/devitalized tissue Goals: Necrotic/devitalized tissue will be minimized in the wound bed Date Initiated: 11/01/2021 Target Resolution Date: 03/03/2022 Goal Status: Active Patient/caregiver will verbalize understanding of reason and process for debridement of necrotic tissue Date Initiated: 11/01/2021 Date Inactivated: 12/05/2021 Target Resolution Date: 12/09/2021 Goal Status: Met Interventions: Assess patient pain level pre-, during and post procedure and prior to discharge Provide education on necrotic tissue and debridement process Treatment Activities: Apply topical anesthetic as ordered : 11/01/2021 Enzymatic debridement : 11/01/2021 Excisional  debridement : 11/01/2021 Notes: Pain, Acute or Chronic Nursing Diagnoses: Pain, acute or chronic: actual or potential Potential alteration in comfort, pain Goals: Patient will verbalize adequate pain control and receive pain control interventions during procedures as needed Date Initiated: 11/01/2021 Date Inactivated: 11/21/2021 Target Resolution Date: 12/09/2021 Goal Status: Met Patient/caregiver will verbalize comfort level met Date Initiated: 11/01/2021 Target Resolution Date: 03/03/2022 Goal Status: Active Interventions: Complete pain assessment as per visit requirements Provide education on pain management Treatment Activities: Administer pain control measures as ordered : 11/01/2021 Notes: Electronic Signature(s) Signed: 02/17/2022 1:39:14 PM By: Deon Pilling RN, BSN Entered By: Deon Pilling on 02/17/2022 08:18:03 -------------------------------------------------------------------------------- Pain Assessment Details Patient Name: Date of Service: [name], [name] 02/17/2022 8:15 A M Medical Record Number: [id number] Patient Account Number: [id number] Date of Birth/Sex: Treating RN: 12/14/1945 (76 y.o. Corey [name] Primary Care Bonnell Placzek: Billey Gosling Other Clinician: Referring Kendre Jacinto: Treating Traxton Kolenda/Extender: Mickle Asper in Treatment: 15 Active Problems Location of Pain Severity and Description of Pain Patient Has Paino No Site Locations Rate the pain. Current Pain Level: 0 Pain Management and Medication Current Pain Management: Medication: No Cold Application: No Rest: No Massage: No Activity: No T.E.N.S.: No Heat Application: No Leg drop or elevation: No Is the Current Pain Management Adequate: Adequate How does your wound impact your activities of daily livingo Sleep: No Bathing: No Appetite: No Relationship With Others: No Bladder Continence: No Emotions: No Bowel Continence: No Work: No Toileting: No Drive:  No Dressing: No Hobbies: No Engineer, maintenance) Signed: 02/17/2022 1:39:14 PM By: Deon Pilling RN, BSN Entered By: Deon Pilling on 02/17/2022 08:06:42 -------------------------------------------------------------------------------- Patient/Caregiver Education Details Patient Name: Date of Service: KO [name] 10/6/2023andnbsp8:15 A M Medical Record Number: [id number] Patient Account Number: [id number] Date of Birth/Gender: Treating RN: [birth year] (76 y.o. Corey [name] Primary Care Physician: Billey Gosling Other Clinician: Referring Physician: Treating Physician/Extender: Mickle Asper in Treatment: 15 Education Assessment Education Provided To: Patient Education Topics Provided Wound Debridement: Handouts: Wound Debridement Methods: Explain/Verbal Responses: State content correctly Electronic Signature(s) Signed: 02/17/2022 1:39:14 PM By: Deon Pilling RN, BSN Entered By: Deon Pilling on 02/17/2022 08:18:17 -------------------------------------------------------------------------------- Wound Assessment Details Patient Name: Date of Service: [name], [name] 02/17/2022 8:15 A M Medical Record Number: [id number] Patient Account Number: [id number] Date of Birth/Sex: Treating RN: February 10, 1946 (76 y.o. [name], [name] Primary Care Dietrich Ke: Billey Gosling Other Clinician: Referring Jahmez Bily: Treating Rogena Deupree/Extender: [name] in Treatment: 15 Wound Status Wound Number: 1 Primary Diabetic Wound/Ulcer of the Lower Extremity Etiology: Wound Location: Right, Lateral Lower Leg Wound Open Wounding Event: Gradually Appeared Status: Date Acquired: 08/15/2021 Comorbid Cataracts, Sleep Apnea, Congestive Heart Failure, Hypertension, Weeks Of Treatment: 15 History: Myocardial Infarction, Type II Diabetes, Gout Clustered Wound: No Photos Wound Measurements Length: (cm) 6.6 Width: (cm) 6.4 Depth: (cm) 0.1 Area:  (cm) 33.175 Volume: (cm) 3.318 %  Reduction in Area: -53.6% % Reduction in Volume: -53.6% Epithelialization: Medium (34-66%) Tunneling: No Undermining: No Wound Description Classification: Grade 2 Wound Margin: Distinct, outline attached Exudate Amount: Medium Exudate Type: Serosanguineous Exudate Color: red, brown Foul Odor After Cleansing: No Slough/Fibrino Yes Wound Bed Granulation Amount: Large (67-100%) Exposed Structure Granulation Quality: Red, Pink Fascia Exposed: No Necrotic Amount: Small (1-33%) Fat Layer (Subcutaneous Tissue) Exposed: Yes Necrotic Quality: Adherent Slough Tendon Exposed: No Muscle Exposed: No Joint Exposed: No Bone Exposed: No Periwound Skin Texture Texture Color No Abnormalities Noted: No No Abnormalities Noted: No Callus: No Atrophie Blanche: No Crepitus: No Cyanosis: No Excoriation: No Ecchymosis: No Induration: No Erythema: No Rash: No Hemosiderin Staining: No Scarring: No Mottled: No Pallor: No Moisture Rubor: No No Abnormalities Noted: No Dry / Scaly: No Temperature / Pain Maceration: No Temperature: No Abnormality Tenderness on Palpation: Yes Treatment Notes Wound #1 (Lower Leg) Wound Laterality: Right, Lateral Cleanser Soap and Water Discharge Instruction: May shower and wash wound with dial antibacterial soap and water prior to dressing change. Wound Cleanser Discharge Instruction: Cleanse the wound with wound cleanser prior to applying a clean dressing using gauze sponges, not tissue or cotton balls. Peri-Wound Care Topical collagen and blastx Discharge Instruction: apply over the topical compounding antibiotics. [facility]-topical compounding antibiotics Discharge Instruction: apply directly to wound bed. Primary Dressing Fibracol Plus Dressing, 4x4.38 in (collagen) Discharge Instruction: Moisten collagen with saline or hydrogel until the blastx and powder collagen arrive. Secondary Dressing ABD Pad,  8x10 Discharge Instruction: Apply over primary dressing as directed. Woven Gauze Sponge, Non-Sterile 4x4 in Discharge Instruction: Apply over primary dressing as directed. Secured With Compression Wrap ThreePress (3 layer compression wrap) Discharge Instruction: Apply three layer compression as directed. Compression Stockings Add-Ons Electronic Signature(s) Signed: 02/17/2022 11:59:37 AM By: Rhae Hammock RN Entered By: Rhae Hammock on 02/17/2022 08:12:06 -------------------------------------------------------------------------------- Vitals Details Patient Name: Date of Service: [name], [name] 02/17/2022 8:15 A M Medical Record Number: [id number] Patient Account Number: [id number] Date of Birth/Sex: Treating RN: 04-19-1946 (76 y.o. Corey [name] Primary Care Sincerity Cedar: Billey Gosling Other Clinician: Referring Xsavier Seeley: Treating Deitra Craine/Extender: Mickle Asper in Treatment: 15 Vital Signs Time Taken: 08:05 Temperature (F): 98.4 Height (in): 66 Pulse (bpm): 46 Weight (lbs): 145 Respiratory Rate (breaths/min): 20 Body Mass Index (BMI): 23.4 Blood Pressure (mmHg): 150/81 Reference Range: 80 - 120 mg / dl Electronic Signature(s) Signed: 02/17/2022 1:39:14 PM By: Deon Pilling RN, BSN Entered By: Deon Pilling on 02/17/2022 08:07:36

## 2022-02-17 NOTE — Telephone Encounter (Signed)
Paperwork printed from e-mail, signed by Dr Marlou Porch and faxed to [facility].

## 2022-02-20 ENCOUNTER — Encounter (HOSPITAL_BASED_OUTPATIENT_CLINIC_OR_DEPARTMENT_OTHER): Payer: Medicare HMO | Admitting: Internal Medicine

## 2022-02-23 NOTE — Telephone Encounter (Addendum)
**Note De-Identified  Obfuscation** Letter received from [facility] stating that they denied the pt Eliquis assistance. Reason: Documentation of 3% out of pocket RX expenses, based on household adjusted gross income, not met. [id number]  The letter states that they have notified the pt of this denial as well.

## 2022-02-27 ENCOUNTER — Encounter (HOSPITAL_BASED_OUTPATIENT_CLINIC_OR_DEPARTMENT_OTHER): Payer: Medicare HMO | Admitting: Internal Medicine

## 2022-02-27 DIAGNOSIS — I5042 Chronic combined systolic (congestive) and diastolic (congestive) heart failure: Secondary | ICD-10-CM | POA: Diagnosis not present

## 2022-02-27 DIAGNOSIS — E1122 Type 2 diabetes mellitus with diabetic chronic kidney disease: Secondary | ICD-10-CM | POA: Diagnosis not present

## 2022-02-27 DIAGNOSIS — N184 Chronic kidney disease, stage 4 (severe): Secondary | ICD-10-CM | POA: Diagnosis not present

## 2022-02-27 DIAGNOSIS — E11622 Type 2 diabetes mellitus with other skin ulcer: Secondary | ICD-10-CM | POA: Diagnosis not present

## 2022-02-27 DIAGNOSIS — I130 Hypertensive heart and chronic kidney disease with heart failure and stage 1 through stage 4 chronic kidney disease, or unspecified chronic kidney disease: Secondary | ICD-10-CM | POA: Diagnosis not present

## 2022-02-27 DIAGNOSIS — L97918 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 with other specified severity: Secondary | ICD-10-CM | POA: Diagnosis not present

## 2022-02-27 DIAGNOSIS — I87311 Chronic venous hypertension (idiopathic) with ulcer of right lower extremity: Secondary | ICD-10-CM | POA: Diagnosis not present

## 2022-02-27 DIAGNOSIS — Z87891 Personal history of nicotine dependence: Secondary | ICD-10-CM | POA: Diagnosis not present

## 2022-02-27 DIAGNOSIS — I25119 Atherosclerotic heart disease of native coronary artery with unspecified angina pectoris: Secondary | ICD-10-CM | POA: Diagnosis not present

## 2022-02-27 NOTE — Progress Notes (Signed)
HUBBARD, SELDON ([id number]) [id number] Page 1 of 10 Visit Report for 02/27/2022 Chief Complaint Document Details Patient Name: Date of Service: Corey [name]. 02/27/2022 10:15 A M Medical Record Number: [id number] Patient Account Number: [id number] Date of Birth/Sex: Treating RN: 1945/05/26 (76 y.o. M) Primary Care Provider: Billey Gosling Other Clinician: Referring Provider: Treating Provider/Extender: Mickle Asper in Treatment: 16 Information Obtained from: Patient Chief Complaint 11/01/2021; right lower extremity wound Electronic Signature(s) Signed: 02/27/2022 9:25:17 AM By: Corey Shan DO Entered By: Corey [name] on 02/27/2022 11:26:58 -------------------------------------------------------------------------------- Debridement Details Patient Name: Date of Service: Corey [name], [name] 02/27/2022 10:15 A M Medical Record Number: [id number] Patient Account Number: [id number] Date of Birth/Sex: Treating RN: 01-May-1946 (76 y.o. Corey [name] Primary Care Provider: Billey Gosling Other Clinician: Referring Provider: Treating Provider/Extender: Mickle Asper in Treatment: 16 Debridement Performed for Assessment: Wound #1 Right,Lateral Lower Leg Performed By: Physician Corey Shan, DO Debridement Type: Debridement Severity of Tissue Pre Debridement: Fat layer exposed Level of Consciousness (Pre-procedure): Awake and Alert Pre-procedure Verification/Time Out Yes - 10:33 Taken: Start Time: 10:34 Pain Control: Lidocaine 4% T opical Solution T Area Debrided (L x W): otal 5 (cm) x 5.4 (cm) = 27 (cm) Tissue and other material debrided: Non-Viable, Slough, Subcutaneous, Skin: Dermis , Slough Level: Skin/Subcutaneous Tissue Debridement Description: Excisional Instrument: Curette Bleeding: Minimum Hemostasis Achieved: Pressure End Time: 10:37 Response to Treatment: Procedure was tolerated  well Level of Consciousness (Post- Awake and Alert procedure): Post Debridement Measurements of Total Wound Length: (cm) 5 Width: (cm) 5.4 Depth: (cm) 0.1 Volume: (cm) 2.121 Character of Wound/Ulcer Post Debridement: Stable Severity of Tissue Post Debridement: Fat layer exposed Post Procedure Diagnosis Corey [name], Corey [name] ([id number]) [id number] Page 2 of 10 Same as Pre-procedure Notes Procedure scribed for Dr. Heber [place] by Corey Halter, RN Electronic Signature(s) Signed: 02/27/2022 9:25:17 AM By: Corey Shan DO Signed: 02/27/2022 1:50:52 PM By: Corey [name] Entered By: Corey [name] on 02/27/2022 10:38:42 -------------------------------------------------------------------------------- HPI Details Patient Name: Date of Service: Corey [name], [name] 02/27/2022 10:15 A M Medical Record Number: [id number] Patient Account Number: [id number] Date of Birth/Sex: Treating RN: 1946-05-03 (76 y.o. M) Primary Care Provider: Billey Gosling Other Clinician: Referring Provider: Treating Provider/Extender: Mickle Asper in Treatment: 16 History of Present Illness HPI Description: Admission 11/01/2021 [name] Is a 76 year old male with a past medical history of of type 2 diabetes, coronary artery disease, stage IV kidney disease acute on chronic combined systolic and diastolic heart failure that presents to the clinic for a 40-monthhistory of right lower extremity wound. He states he is not sure how this started. It is completely eschared. He reports his wound has been stable for the past month. He has been keeping the area covered. He has mild chronic pain. He denies signs of infection. He states he has a history of lower extremity ulcers last being on the left leg. He could not tell me how this was treated in the past. 6/27; patient presents for follow-up. He is scheduled for his lower extremity arterial studies on July 6th. He has been using  Medihoney to the wound bed. He has no issues or complaints today. He denies signs of infection. 7/6; patient presents for follow-up. He has home health that reported the wound having odor and increased pain last week. He was sent to urgent care. He was started on doxycycline. He is continuing Medihoney. He has his scheduled ABIs w/TBIs  today. 7/10; patient presents for follow-up. He had his arterial studies done last week. He had triphasic waveforms throughout the right lower extremity. His TBI was 0.64. He has good pedal pulses. He is almost done with his course of antibiotics. He has been using mupirocin ointment to the wound bed. 7/17; patient presents for follow-up. He has been using Iodosorb under compression therapy. He has no issues or complaints today. He states he is receiving Keystone antibiotic tomorrow In the mail. 7/24; patient presents for follow-up. He has been doing Dakin's wet-to-dry dressings to the wound bed daily without issues. He currently denies signs of infection. He reports completing his course of doxycycline. He has his Keystone antibiotic with him today. 7/31; patient presents for follow-up. We have been using Iodoflex with [facility] under compression therapy. He has no issues or complaints today. He denies signs of infection. 8/7; patient presents for follow-up. We have been using Iodoflex with [facility] under compression therapy. Home health came out once last week to change the wrap. He has no issues or complaints today. He denies signs of infection. 8/14; patient presents for follow-up. We have been using Keystone with Hydrofera Blue under compression therapy. He has tolerated this well. He has no issues or complaints today. 8/21; patient presents for follow-up. We have been using Keystone with Hydrofera Blue under compression therapy. The Hydrofera Blue was stuck on tightly to the wound bed today. Other than that he has no issues or complaints. We have not heard back  for insurance approval for skin sub. 8/28; patient presents for follow-up. We ordered blast X and collagen powder at last clinic visit. It is unclear if he has received this. We placed Keystone with Fibracol under 3 layer compression last week. Patient has home health. Patient has no issues or complaints today. He was not approved for TheraSkin. 9/5; patient presents for follow-up. Insurance would not cover blast X. We do have a sample and he would like to try this. We have been using Keystone antibiotic and collagen under 3 layer compression. He has no issues or complaints today. 9/18; patient presents for follow-up. We have been using collagen with blast X and Keystone under compression therapy. He has no issues or complaints today. 9/25; patient presents for follow-up. We have continued to use collagen, blast X and Keystone under compression therapy. Patient has no issues or complaints today. 10/6; patient presents for follow-up. We have been using collagen, blast X and Keystone under compression therapy. He denies signs of infection and has no issues or complaints today. Patient has home health that comes out on Wednesdays and Fridays. 10/16; patient presents for follow-up. He has been using collagen, blast X and Keystone with benefit to the wound bed under compression therapy. He is about to run out of [facility] antibiotic ointment. He has no issues or complaints today. Electronic Signature(s) TRAVER, MECKES ([id number]) [id number] Page 3 of 10 Signed: 02/27/2022 9:25:17 AM By: Corey Shan DO Entered By: Corey [name] on 02/27/2022 11:27:36 -------------------------------------------------------------------------------- Physical Exam Details Patient Name: Date of Service: Corey [name], [name] 02/27/2022 10:15 A M Medical Record Number: [id number] Patient Account Number: [id number] Date of Birth/Sex: Treating RN: 1945-10-30 (76 y.o. M) Primary Care Provider:  Billey Gosling Other Clinician: Referring Provider: Treating Provider/Extender: Mickle Asper in Treatment: 16 Constitutional respirations regular, non-labored and within target range for patient.. Cardiovascular 2+ dorsalis pedis/posterior tibialis pulses. Psychiatric pleasant and cooperative. Notes Right lower extremity: T the anterior aspect there is  granulation tissue and nonviable tissue present. No signs of surrounding infection. Good edema control. o Electronic Signature(s) Signed: 02/27/2022 9:25:17 AM By: Corey Shan DO Entered By: Corey [name] on 02/27/2022 11:28:20 -------------------------------------------------------------------------------- Physician Orders Details Patient Name: Date of Service: Corey [name], [name] 02/27/2022 10:15 A M Medical Record Number: [id number] Patient Account Number: [id number] Date of Birth/Sex: Treating RN: Feb 05, 1946 (76 y.o. Corey [name] Primary Care Provider: Billey Gosling Other Clinician: Referring Provider: Treating Provider/Extender: Mickle Asper in Treatment: 67 Verbal / Phone Orders: No Diagnosis Coding ICD-10 Coding Code Description L97.918 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 with other specified severity E11.622 Type 2 diabetes mellitus with other skin ulcer I50.42 Chronic combined systolic (congestive) and diastolic (congestive) heart failure I25.119 Atherosclerotic heart disease of native coronary artery with unspecified angina pectoris I87.311 Chronic venous hypertension (idiopathic) with ulcer of right lower extremity Follow-up Appointments ppointment in 1 week. - Dr. Heber [place] and Tammi Klippel, Room 8 Return A Other: - ****[place] topical compounding antibiotics bring to appt each time.**** Bring in the Blastx as well. Anesthetic (In clinic) Topical Lidocaine 5% applied to wound bed (In clinic) Topical Lidocaine 4% applied to wound  bed Corey [name], Corey [name] ([id number]) [id number] Page 4 of 10 Cellular or Tissue Based Products Cellular or Tissue Based Product Type: - run insurance for [employer], epicord, and organogenesis advance tissue product. Theraskin not approved. Epicord not covered. [employer] Apligraf approved. Bathing/ Shower/ Hygiene May shower with protection but do not get wound dressing(s) wet. Edema Control - Lymphedema / SCD / Other Elevate legs to the level of the heart or above for 30 minutes daily and/or when sitting, a frequency of: - 3-4 times a day throughout the day. Avoid standing for long periods of time. Home Health No change in wound care orders this week; continue Home Health for wound care. May utilize formulary equivalent dressing for wound treatment orders unless otherwise specified. - home health to twice a week. Compounding topical antibiotics directly to wound bed. Then Place collagen and Blastx. Use plain collagen over the topical antibiotics. Other Home Health Orders/Instructions: - [facility] Wound Treatment Wound #1 - Lower Leg Wound Laterality: Right, Lateral Cleanser: Soap and Water (Home Health) 3 x Per Week/30 Days Discharge Instructions: May shower and wash wound with dial antibacterial soap and water prior to dressing change. Cleanser: Wound Cleanser (Home Health) 3 x Per Week/30 Days Discharge Instructions: Cleanse the wound with wound cleanser prior to applying a clean dressing using gauze sponges, not tissue or cotton balls. Topical: collagen and blastx (Home Health) 3 x Per Week/30 Days Discharge Instructions: apply over the topical compounding antibiotics. Topical: [facility]-topical compounding antibiotics (Home Health) 3 x Per Week/30 Days Discharge Instructions: apply directly to wound bed. Prim Dressing: Fibracol Plus Dressing, 4x4.38 in (collagen) (Home Health) 3 x Per Week/30 Days ary Discharge Instructions: Moisten collagen  with saline or hydrogel until the blastx and powder collagen arrive. Secondary Dressing: ABD Pad, 8x10 (Home Health) 3 x Per Week/30 Days Discharge Instructions: Apply over primary dressing as directed. Secondary Dressing: Woven Gauze Sponge, Non-Sterile 4x4 in (Home Health) 3 x Per Week/30 Days Discharge Instructions: Apply over primary dressing as directed. Compression Wrap: ThreePress (3 layer compression wrap) 3 x Per Week/30 Days Discharge Instructions: Apply three layer compression as directed. Electronic Signature(s) Signed: 02/27/2022 9:25:17 AM By: Corey Shan DO Entered By: Corey [name] on 02/27/2022 11:28:30 -------------------------------------------------------------------------------- Problem List Details Patient Name: Date of Service: Corey  [name]. 02/27/2022 10:15 A M Medical Record Number: [id number] Patient Account Number: [id number] Date of Birth/Sex: Treating RN: [birth year] (76 y.o. Corey [name] Primary Care Provider: Billey Gosling Other Clinician: Referring Provider: Treating Provider/Extender: Mickle Asper in Treatment: 16 Active Problems ICD-10 Encounter Code Description Active Date MDM Diagnosis L97.918 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 with other 11/01/2021 No Yes specified severity Corey [name], Corey [name] ([id number]) [id number] Page 5 of 10 E11.622 Type 2 diabetes mellitus with other skin ulcer 11/01/2021 No Yes I50.42 Chronic combined systolic (congestive) and diastolic (congestive) heart failure 11/01/2021 No Yes I25.119 Atherosclerotic heart disease of native coronary artery with unspecified angina 11/01/2021 No Yes pectoris I87.311 Chronic venous hypertension (idiopathic) with ulcer of right lower extremity 12/26/2021 No Yes Inactive Problems Resolved Problems Electronic Signature(s) Signed: 02/27/2022 9:25:17 AM By: Corey Shan DO Previous Signature:  02/27/2022 7:33:47 AM Version By: Corey [name] Entered By: Corey [name] on 02/27/2022 11:26:43 -------------------------------------------------------------------------------- Progress Note Details Patient Name: Date of Service: Corey [name], [name] 02/27/2022 10:15 A M Medical Record Number: [id number] Patient Account Number: [id number] Date of Birth/Sex: Treating RN: June 02, 1945 (76 y.o. M) Primary Care Provider: Billey Gosling Other Clinician: Referring Provider: Treating Provider/Extender: Mickle Asper in Treatment: 16 Subjective Chief Complaint Information obtained from Patient 11/01/2021; right lower extremity wound History of Present Illness (HPI) Admission 11/01/2021 [name] Is a 76 year old male with a past medical history of of type 2 diabetes, coronary artery disease, stage IV kidney disease acute on chronic combined systolic and diastolic heart failure that presents to the clinic for a 86-monthhistory of right lower extremity wound. He states he is not sure how this started. It is completely eschared. He reports his wound has been stable for the past month. He has been keeping the area covered. He has mild chronic pain. He denies signs of infection. He states he has a history of lower extremity ulcers last being on the left leg. He could not tell me how this was treated in the past. 6/27; patient presents for follow-up. He is scheduled for his lower extremity arterial studies on July 6th. He has been using Medihoney to the wound bed. He has no issues or complaints today. He denies signs of infection. 7/6; patient presents for follow-up. He has home health that reported the wound having odor and increased pain last week. He was sent to urgent care. He was started on doxycycline. He is continuing Medihoney. He has his scheduled ABIs w/TBIs today. 7/10; patient presents for follow-up. He had his arterial studies done last week. He had triphasic  waveforms throughout the right lower extremity. His TBI was 0.64. He has good pedal pulses. He is almost done with his course of antibiotics. He has been using mupirocin ointment to the wound bed. 7/17; patient presents for follow-up. He has been using Iodosorb under compression therapy. He has no issues or complaints today. He states he is receiving Keystone antibiotic tomorrow In the mail. 7/24; patient presents for follow-up. He has been doing Dakin's wet-to-dry dressings to the wound bed daily without issues. He currently denies signs of infection. He reports completing his course of doxycycline. He has his Keystone antibiotic with him today. 7/31; patient presents for follow-up. We have [facility] He has no issues or complaints today. He denies signs of infection. 8/7; patient presents for follow-up. We have been [facility]lex [facility]  [facility] compression [facility]. Home health came out once last week to change the wrap. He has no issues or complaints today. He denies signs of infection. Corey [name], Corey [name] ([id number]) [id number] Page 6 of 10 8/14; patient presents for follow-up. We have been using Keystone with Hydrofera Blue under compression therapy. He has tolerated this well. He has no issues or complaints today. 8/21; patient presents for follow-up. We have been using Keystone with Hydrofera Blue under compression therapy. The Hydrofera Blue was stuck on tightly to the wound bed today. Other than that he has no issues or complaints. We have not heard back for insurance approval for skin sub. 8/28; patient presents for follow-up. We ordered blast X and collagen powder at last clinic visit. It is unclear if he has received this. We placed Keystone with Fibracol under 3 layer compression last week. Patient has home health. Patient has no issues or complaints today. He was not approved for TheraSkin. 9/5; patient  presents for follow-up. Insurance would not cover blast X. We do have a sample and he would like to try this. We have been using Keystone antibiotic and collagen under 3 layer compression. He has no issues or complaints today. 9/18; patient presents for follow-up. We have been using collagen with blast X and Keystone under compression therapy. He has no issues or complaints today. 9/25; patient presents for follow-up. We have continued to use collagen, blast X and Keystone under compression therapy. Patient has no issues or complaints today. 10/6; patient presents for follow-up. We have been using collagen, blast X and Keystone under compression therapy. He denies signs of infection and has no issues or complaints today. Patient has home health that comes out on Wednesdays and Fridays. 10/16; patient presents for follow-up. He has been using collagen, blast X and Keystone with benefit to the wound bed under compression therapy. He is about to run out of [facility] antibiotic ointment. He has no issues or complaints today. Patient History Family History Cancer - Father, Diabetes - Mother,Father, Heart Disease - Mother,Siblings, Hypertension - Mother,Father. Social History Former smoker - quit 1970, Marital Status - Married, Drug Use - No History, Caffeine Use - Moderate - coffee. Medical History Eyes Patient has history of Cataracts Respiratory Patient has history of Sleep Apnea - has CPAP does not wear Cardiovascular Patient has history of Congestive Heart Failure, Hypertension, Myocardial Infarction - 2014 Endocrine Patient has history of Type II Diabetes Musculoskeletal Patient has history of Gout Neurologic Denies history of Seizure Disorder Hospitalization/Surgery History - 06/2021 inguinal hernia repair. - cardioversion 09/2020. - ventral hernia repair 3/19. Medical A Surgical History Notes nd Constitutional Symptoms (General Health) CVA 2014 bilateral hearing  loss Gastrointestinal Diverticulosis Genitourinary Renal insufficiency 20/ Oncologic Prostate Ca Objective Constitutional respirations regular, non-labored and within target range for patient.. Vitals Time Taken: 9:54 AM, Height: 66 in, Weight: 145 lbs, BMI: 23.4, Temperature: 97.6 F, Pulse: 51 bpm, Respiratory Rate: 18 breaths/min, Blood Pressure: 156/76 mmHg. Cardiovascular 2+ dorsalis pedis/posterior tibialis pulses. Psychiatric pleasant and cooperative. General Notes: Right lower extremity: T the anterior aspect there is granulation tissue and nonviable tissue present. No signs of surrounding infection. Good o edema control. Integumentary (Hair, Skin) Wound #1 status is Open. Original cause of wound was Gradually Appeared. The date acquired was: 08/15/2021. The wound has been in treatment 16 weeks. The wound is located on the Right,Lateral Lower Leg. The wound measures 5cm length x 5.4cm width x 0.1cm depth; 21.206cm^2 area and 2.121cm^3 volume. There is Fat Layer (  Subcutaneous Tissue) exposed. There is no tunneling or undermining noted. There is a medium amount of serosanguineous drainage noted. Corey [name], Corey [name] ([id number]) [id number] Page 7 of 10 The wound margin is distinct with the outline attached to the wound base. There is large (67-100%) red, pink granulation within the wound bed. There is a small (1-33%) amount of necrotic tissue within the wound bed including Adherent Slough. The periwound skin appearance did not exhibit: Callus, Crepitus, Excoriation, Induration, Rash, Scarring, Dry/Scaly, Maceration, Atrophie Blanche, Cyanosis, Ecchymosis, Hemosiderin Staining, Mottled, Pallor, Rubor, Erythema. Periwound temperature was noted as No Abnormality. The periwound has tenderness on palpation. 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 Type 2 diabetes mellitus with  other skin ulcer Chronic combined systolic (congestive) and diastolic (congestive) heart failure Atherosclerotic heart disease of native coronary artery with unspecified angina pectoris Chronic venous hypertension (idiopathic) with ulcer of right lower extremity Patient's wound has shown improvement in size and appearance since last clinic visit. I debrided nonviable tissue. I recommended continuing the course with blast X, Keystone antibiotic ointment and collagen all under compression therapy. Once the [facility] antibiotic runs out I recommended stopping this part of the treatment plan. No need to refill it for now. Follow-up in 1 week. Procedures Wound #1 Pre-procedure diagnosis of Wound #1 is a Diabetic Wound/Ulcer of the Lower Extremity located on the Right,Lateral Lower Leg .Severity of Tissue Pre Debridement is: Fat layer exposed. There was a Excisional Skin/Subcutaneous Tissue Debridement with a total area of 27 sq cm performed by Corey Shan, DO. With the following instrument(s): Curette to remove Non-Viable tissue/material. Material removed includes Subcutaneous Tissue, Slough, and Skin: Dermis after achieving pain control using Lidocaine 4% Topical Solution. No specimens were taken. A time out was conducted at 10:33, prior to the start of the procedure. A Minimum amount of bleeding was controlled with Pressure. The procedure was tolerated well. Post Debridement Measurements: 5cm length x 5.4cm width x 0.1cm depth; 2.121cm^3 volume. Character of Wound/Ulcer Post Debridement is stable. Severity of Tissue Post Debridement is: Fat layer exposed. Post procedure Diagnosis Wound #1: Same as Pre-Procedure General Notes: Procedure scribed for Dr. Heber [place] by Corey Halter, RN. Pre-procedure diagnosis of Wound #1 is a Diabetic Wound/Ulcer of the Lower Extremity located on the Right,Lateral Lower Leg . There was a Three Layer Compression Therapy Procedure by Corey Jackson, RN. Post procedure  Diagnosis Wound #1: Same as Pre-Procedure Plan Follow-up Appointments: Return Appointment in 1 week. - Dr. Heber [place] and Tammi Klippel, Room 8 Other: - ****[facility] topical compounding antibiotics bring to appt each time.**** Bring in the Blastx as well. Anesthetic: (In clinic) Topical Lidocaine 5% applied to wound bed (In clinic) Topical Lidocaine 4% applied to wound bed Cellular or Tissue Based Products: Cellular or Tissue Based Product Type: - run insurance for [employer], epicord, and organ[employer] advance tissue product. Theraskin not approved. Epicord not covered. [employer] Apligraf approved. Bathing/ Shower/ Hygiene: May shower with protection but do not get wound dressing(s) wet. Edema Control - Lymphedema / SCD / Other: Elevate legs to the level of the heart or above for 30 minutes daily and/or when sitting, a frequency of: - 3-4 times a day throughout the day. Avoid standing for long periods of time. Home Health: No change in wound care orders this week; continue Home Health for wound care. May utilize formulary equivalent dressing for wound treatment orders unless otherwise specified. - home health to twice a week. Compounding topical antibiotics  directly to wound bed. Then Place collagen and Blastx. Use plain collagen over the topical antibiotics. Other Home Health Orders/Instructions: - [facility] WOUND #1: - Lower Leg Wound Laterality: Right, Lateral Cleanser: Soap and Water (Home Health) 3 x Per Week/30 Days Discharge Instructions: May shower and wash wound with dial antibacterial soap and water prior to dressing change. Cleanser: Wound Cleanser (Home Health) 3 x Per Week/30 Days Discharge Instructions: Cleanse the wound with wound cleanser prior to applying a clean dressing using gauze sponges, not tissue or cotton balls. Topical: collagen and blastx (Home Health) 3 x Per Week/30 Days Discharge Instructions: apply over the topical compounding  antibiotics. Topical: [facility]-topical compounding antibiotics (Home Health) 3 x Per Week/30 Days Discharge Instructions: apply directly to wound bed. Prim Dressing: Fibracol Plus Dressing, 4x4.38 in (collagen) (Home Health) 3 x Per Week/30 Days ary Discharge Instructions: Moisten collagen with saline or hydrogel until the blastx and powder collagen arrive. Secondary Dressing: ABD Pad, 8x10 (Home Health) 3 x Per Week/30 Days Discharge Instructions: Apply over primary dressing as directed. Secondary Dressing: Woven Gauze Sponge, Non-Sterile 4x4 in (Home Health) 3 x Per Week/30 Days Discharge Instructions: Apply over primary dressing as directed. Com pression Wrap: ThreePress (3 layer compression wrap) 3 x Per Week/30 Days Corey [name], CARAVEO ([id number]) [id number] Page 8 of 10 Discharge Instructions: Apply three layer compression as directed. 1. In office sharp debridement 2. Blast X, collagen and [facility] antibiotic ointment 3. Follow-up in 1 week Electronic Signature(s) Signed: 02/27/2022 9:25:17 AM By: Corey Shan DO Entered By: Corey [name] on 02/27/2022 11:29:54 -------------------------------------------------------------------------------- HxROS Details Patient Name: Date of Service: Corey [name], [name] 02/27/2022 10:15 A M Medical Record Number: [id number] Patient Account Number: [id number] Date of Birth/Sex: Treating RN: [birth year] (76 y.o. M) Primary Care Provider: Billey Gosling Other Clinician: Referring Provider: Treating Provider/Extender: [name] in Treatment: 16 Constitutional Symptoms (General Health) Medical History: Past Medical History Notes: CVA 2014 bilateral hearing loss Eyes Medical History: Positive for: Cataracts Respiratory Medical History: Positive for: Sleep Apnea - has CPAP does not wear Cardiovascular Medical History: Positive for: Congestive Heart Failure; Hypertension; Myocardial  Infarction - 2014 Gastrointestinal Medical History: Past Medical History Notes: Diverticulosis Endocrine Medical History: Positive for: Type II Diabetes Time with diabetes: >20 years Treated with: Oral agents, Diet Blood sugar tested every day: Yes Tested : BID Genitourinary Medical History: Past Medical History Notes: Renal insufficiency 20/ Musculoskeletal Medical History: Positive for: Gout TOIVO, BORDON ([id number]) [id number] Page 9 of 10 Neurologic Medical History: Negative for: Seizure Disorder Oncologic Medical History: Past Medical History Notes: Prostate Ca HBO Extended History Items Eyes: Cataracts Immunizations Pneumococcal Vaccine: Received Pneumococcal Vaccination: No Implantable Devices No devices added Hospitalization / Surgery History Type of Hospitalization/Surgery 06/2021 inguinal hernia repair cardioversion 09/2020 ventral hernia repair 3/19 Family and Social History Cancer: Yes - Father; Diabetes: Yes - Mother,Father; Heart Disease: Yes - Mother,Siblings; Hypertension: Yes - Mother,Father; Former smoker - quit 1970; Marital Status - Married; Drug Use: No History; Caffeine Use: Moderate - coffee; Financial Concerns: No; Food, Clothing or Shelter Needs: No; Support System Lacking: No; Transportation Concerns: No Electronic Signature(s) Signed: 02/27/2022 9:25:17 AM By: Corey Shan DO Entered By: Corey [name] on 02/27/2022 11:27:43 -------------------------------------------------------------------------------- SuperBill Details Patient Name: Date of Service: Corey [name], [name] 02/27/2022 Medical Record Number: [id number] Patient Account Number: [id number] Date of Birth/Sex: Treating RN: [birth year] (76 y.o. 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 with other specified severity E11.622 Type 2 diabetes mellitus with other skin ulcer I50.42 Chronic combined systolic (congestive) and diastolic (congestive) heart failure I25.119 Atherosclerotic heart disease of native coronary artery with unspecified angina pectoris I87.311 Chronic venous hypertension (idiopathic) with ulcer of right lower extremity Facility Procedures : LYNDELL, ALLAIRE Code: [id number] Maia Plan ([id number]) [id number] 11 I Description: 11042 - DEB SUBQ TISSUE 20 SQ CM/< ICD-10 Diagnosis Description L97.918 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 with other spe [phone] 045 - DEB SUBQ TISS EA ADDL 20CM CD-10 Diagnosis Description L97.918  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 with other specifie Modifier: cified severity _Physician_51227.p 1 d severity Quantity: 1 df Page 10 of 10 Physician Procedures : CPT4 Code Description Modifier [id number] 11042 - WC PHYS SUBQ TISS 20 SQ CM ICD-10 Diagnosis Description K35.248 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

## 2022-02-27 NOTE — Progress Notes (Signed)
[name], Corey ([id number]) [id number] Page 1 of 8 Visit Report for 02/27/2022 Arrival Information Details Patient Name: Date of Service: Corey [name]. 02/27/2022 10:15 A M Medical Record Number: [id number] Patient Account Number: [id number] Date of Birth/Sex: Treating RN: July 03, 1945 (76 y.o. M) Primary Care Onie Kasparek: Billey Gosling Other Clinician: Referring Daunte Oestreich: Treating Laquesha Holcomb/Extender: Mickle Asper in Treatment: 16 Visit Information History Since Last Visit Added or deleted any medications: No Patient Arrived: Ambulatory Any new allergies or adverse reactions: No Arrival Time: 09:53 Had a fall or experienced change in No Accompanied By: wife activities of daily living that may affect Transfer Assistance: None risk of falls: Patient Identification Verified: Yes Signs or symptoms of abuse/neglect since last visito No Secondary Verification Process Completed: Yes Hospitalized since last visit: No Patient Requires Transmission-Based No Implantable device outside of the clinic excluding No Precautions: cellular tissue based products placed in the center Patient Has Alerts: Yes since last visit: Patient Alerts: in clinic R [place] Has Dressing in Place as Prescribed: Yes 11/17/2021 ABIs: both [place] Has Compression in Place as Prescribed: Yes 11/17/2021 TBI: R0.64 L0.58 Pain Present Now: No Electronic Signature(s) Signed: 02/27/2022 1:40:46 PM By: Erenest Blank Entered By: Erenest Blank on 02/27/2022 09:54:50 -------------------------------------------------------------------------------- Compression Therapy Details Patient Name: Date of Service: Corey [name], [name] 02/27/2022 10:15 A M Medical Record Number: [id number] Patient Account Number: [id number] Date of Birth/Sex: Treating RN: [birth year] (76 y.o. Corey [name] Primary Care Jarae Nemmers: Billey Gosling Other Clinician: Referring Lashun Mccants: Treating Lavana Huckeba/Extender:  Mickle Asper in Treatment: 16 Compression Therapy Performed for Wound Assessment: Wound #1 Right,Lateral Lower Leg Performed By: Clinician Lorrin Jackson, RN Compression Type: Three Layer Post Procedure Diagnosis Same as Pre-procedure Electronic Signature(s) Signed: 02/27/2022 1:50:52 PM By: Lorrin Jackson Entered By: Lorrin Jackson on 02/27/2022 10:38:56 [name] ([id number]) [id number] Page 2 of 8 -------------------------------------------------------------------------------- Encounter Discharge Information Details Patient Name: Date of Service: Corey [name]. 02/27/2022 10:15 A M Medical Record Number: [id number] Patient Account Number: [id number] Date of Birth/Sex: Treating RN: 1945-10-23 (76 y.o. Corey [name] Primary Care Yuvin Bussiere: Billey Gosling Other Clinician: Referring Elivia Robotham: Treating Phoua Hoadley/Extender: Mickle Asper in Treatment: 61 Encounter Discharge Information Items Post Procedure Vitals Discharge Condition: Stable Temperature (F): 97.6 Ambulatory Status: Ambulatory Pulse (bpm): 51 Discharge Destination: Home Respiratory Rate (breaths/min): 18 Transportation: Private Auto Blood Pressure (mmHg): 156/76 Accompanied By: wife Schedule Follow-up Appointment: Yes Clinical Summary of Care: Provided on 02/27/2022 Form Type Recipient Paper Patient Patient Electronic Signature(s) Signed: 02/27/2022 1:50:52 PM By: Lorrin Jackson Entered By: Lorrin Jackson on 02/27/2022 11:06:47 -------------------------------------------------------------------------------- Lower Extremity Assessment Details Patient Name: Date of Service: Corey Corey [name]. 02/27/2022 10:15 A M Medical Record Number: [id number] Patient Account Number: [id number] Date of Birth/Sex: Treating RN: [birth year] (76 y.o. M) Primary Care Chaunice Obie: Billey Gosling Other Clinician: Referring Anselma Herbel: Treating Becci Batty/Extender:  [name] in Treatment: 16 Edema Assessment Assessed: [Left: No] [Right: No] Edema: [Left: Ye] [Right: s] Calf Left: Right: Point of Measurement: 33 cm From Medial Instep 32.5 cm Ankle Left: Right: Point of Measurement: 11 cm From Medial Instep 21.1 cm Vascular Assessment Pulses: Dorsalis Pedis Palpable: [Right:Yes] Electronic Signature(s) Signed: 02/27/2022 1:40:46 PM By: Erenest Blank Entered By: Erenest Blank on 02/27/2022 10:09:23 [name] ([id number]) [id number] Page 3 of 8 -------------------------------------------------------------------------------- Multi Wound Chart Details Patient Name: Date of Service: Corey [name], [name] 02/27/2022 10:15 A M Medical  Record Number: [id number] Patient Account Number: [id number] Date of Birth/Sex: Treating RN: [birth year] (76 y.o. M) Primary Care Riyansh Gerstner: Billey Gosling Other Clinician: Referring Karri Kallenbach: Treating Stoney Karczewski/Extender: Mickle Asper in Treatment: 16 Vital Signs Height(in): 66 Pulse(bpm): 51 Weight(lbs): 145 Blood Pressure(mmHg): 156/76 Body Mass Index(BMI): 23.4 Temperature(F): 97.6 Respiratory Rate(breaths/min): 18 [1:Photos:] [N/A:N/A] Right, Lateral Lower Leg N/A N/A Wound Location: Gradually Appeared N/A N/A Wounding Event: Diabetic Wound/Ulcer of the Lower N/A N/A Primary Etiology: Extremity Cataracts, Sleep Apnea, Congestive N/A N/A Comorbid History: Heart Failure, Hypertension, Myocardial Infarction, Type II Diabetes, Gout 08/15/2021 N/A N/A Date Acquired: 16 N/A N/A Weeks of Treatment: Open N/A N/A Wound Status: No N/A N/A Wound Recurrence: 5x5.4x0.1 N/A N/A Measurements L x W x D (cm) 21.206 N/A N/A A (cm) : rea 2.121 N/A N/A Volume (cm) : 1.80% N/A N/A % Reduction in A rea: 1.80% N/A N/A % Reduction in Volume: Grade 2 N/A N/A Classification: Medium N/A N/A Exudate A mount: Serosanguineous N/A  N/A Exudate Type: red, brown N/A N/A Exudate Color: Distinct, outline attached N/A N/A Wound Margin: Large (67-100%) N/A N/A Granulation A mount: Red, Pink N/A N/A Granulation Quality: Small (1-33%) N/A N/A Necrotic A mount: Fat Layer (Subcutaneous Tissue): Yes N/A N/A Exposed Structures: Fascia: No Tendon: No Muscle: No Joint: No Bone: No Medium (34-66%) N/A N/A Epithelialization: Debridement - Excisional N/A N/A Debridement: Pre-procedure Verification/Time Out 10:33 N/A N/A Taken: Lidocaine 4% Topical Solution N/A N/A Pain Control: Subcutaneous, Slough N/A N/A Tissue Debrided: Skin/Subcutaneous Tissue N/A N/A Level: 27 N/A N/A Debridement A (sq cm): rea Curette N/A N/A Instrument: Minimum N/A N/A Bleeding: Pressure N/A N/A Hemostasis A chieved: Procedure was tolerated well N/A N/A Debridement Treatment Response: 5x5.4x0.1 N/A N/A Post Debridement Measurements L x W x D (cm) 2.121 N/A N/A Post Debridement Volume: (cm) Excoriation: No N/A N/A Periwound Skin Texture: Induration: No Callus: No Crepitus: No Rash: No Scarring: No Maceration: No N/A N/A Periwound Skin Moisture: Dry/Scaly: No NATHANEL, TALLMAN ([id number]) [id number] Page 4 of 8 Atrophie Blanche: No N/A N/A Periwound Skin Color: Cyanosis: No Ecchymosis: No Erythema: No Hemosiderin Staining: No Mottled: No Pallor: No Rubor: No No Abnormality N/A N/A Temperature: Yes N/A N/A Tenderness on Palpation: Compression Therapy N/A N/A Procedures Performed: Debridement Treatment Notes Wound #1 (Lower Leg) Wound Laterality: Right, Lateral Cleanser Soap and Water Discharge Instruction: May shower and wash wound with dial antibacterial soap and water prior to dressing change. Wound Cleanser Discharge Instruction: Cleanse the wound with wound cleanser prior to applying a clean dressing using gauze sponges, not tissue or cotton balls. Peri-Wound Care Topical collagen  and blastx Discharge Instruction: apply over the topical compounding antibiotics. [facility]-topical compounding antibiotics Discharge Instruction: apply directly to wound bed. Primary Dressing Fibracol Plus Dressing, 4x4.38 in (collagen) Discharge Instruction: Moisten collagen with saline or hydrogel until the blastx and powder collagen arrive. Secondary Dressing ABD Pad, 8x10 Discharge Instruction: Apply over primary dressing as directed. Woven Gauze Sponge, Non-Sterile 4x4 in Discharge Instruction: Apply over primary dressing as directed. Secured With Compression Wrap ThreePress (3 layer compression wrap) Discharge Instruction: Apply three layer compression as directed. Compression Stockings Add-Ons Electronic Signature(s) Signed: 02/27/2022 9:25:17 AM By: Kalman Shan DO Entered By: Kalman Shan on 02/27/2022 11:26:49 -------------------------------------------------------------------------------- Multi-Disciplinary Care Plan Details Patient Name: Date of Service: Corey [name], [name] 02/27/2022 10:15 A M Medical Record Number: [id number] Patient Account Number: [id number] Date of Birth/Sex: Treating RN: 15-Apr-1946 (76 y.o. Corey [name] Primary Care Tahiri Shareef: Quay Burow,  [place].Kava Other Clinician: Referring Edon Hoadley: Treating Luka Stohr/Extender: Mickle Asper in Treatment: [address] ANN, [place] ([id number]) [id number] Page 5 of 8 Necrotic Tissue Nursing Diagnoses: Knowledge deficit related to management of necrotic/devitalized tissue Goals: Necrotic/devitalized tissue will be minimized in the wound bed Date Initiated: 11/01/2021 Target Resolution Date: 03/03/2022 Goal Status: Active Patient/caregiver will verbalize understanding of reason and process for debridement of necrotic tissue Date Initiated: 11/01/2021 Date Inactivated: 12/05/2021 Target Resolution Date: 12/09/2021 Goal Status:  Met Interventions: Assess patient pain level pre-, during and post procedure and prior to discharge Provide education on necrotic tissue and debridement process Treatment Activities: Apply topical anesthetic as ordered : 11/01/2021 Enzymatic debridement : 11/01/2021 Excisional debridement : 11/01/2021 Notes: Pain, Acute or Chronic Nursing Diagnoses: Pain, acute or chronic: actual or potential Potential alteration in comfort, pain Goals: Patient will verbalize adequate pain control and receive pain control interventions during procedures as needed Date Initiated: 11/01/2021 Date Inactivated: 11/21/2021 Target Resolution Date: 12/09/2021 Goal Status: Met Patient/caregiver will verbalize comfort level met Date Initiated: 11/01/2021 Target Resolution Date: 03/03/2022 Goal Status: Active Interventions: Complete pain assessment as per visit requirements Provide education on pain management Treatment Activities: Administer pain control measures as ordered : 11/01/2021 Notes: Electronic Signature(s) Signed: 02/27/2022 7:34:03 AM By: Lorrin Jackson Entered By: Lorrin Jackson on 02/27/2022 10:34:03 -------------------------------------------------------------------------------- Pain Assessment Details Patient Name: Date of Service: Corey [name], [name] 02/27/2022 10:15 A M Medical Record Number: [id number] Patient Account Number: [id number] Date of Birth/Sex: Treating RN: December 09, 1945 (76 y.o. M) Primary Care Branndon Tuite: Billey Gosling Other Clinician: Referring Zoraida Havrilla: Treating Treshawn Allen/Extender: Mickle Asper in Treatment: 2 Active Problems Location of Pain Severity and Description of Pain Patient Has Paino No Site Locations MARCE, [place] ([id number]) [id number] Page 6 of 8 Pain Management and Medication Current Pain Management: Electronic Signature(s) Signed: 02/27/2022 1:40:46 PM By: Erenest Blank Entered By: Erenest Blank on 02/27/2022  09:55:18 -------------------------------------------------------------------------------- Patient/Caregiver Education Details Patient Name: Date of Service: Corey [name], Tor Netters 10/16/2023andnbsp10:15 A M Medical Record Number: [id number] Patient Account Number: [id number] Date of Birth/Gender: Treating RN: 1946-01-27 (76 y.o. Corey [name] Primary Care Physician: Billey Gosling Other Clinician: Referring Physician: Treating Physician/Extender: Mickle Asper in Treatment: 16 Education Assessment Education Provided To: Patient Education Topics Provided Venous: Methods: Explain/Verbal, Printed Responses: State content correctly Wound/Skin Impairment: Methods: Explain/Verbal, Printed Responses: State content correctly Electronic Signature(s) Signed: 02/27/2022 1:50:52 PM By: Lorrin Jackson Entered By: Lorrin Jackson on 02/27/2022 10:34:37 -------------------------------------------------------------------------------- Wound Assessment Details Patient Name: Date of Service: Corey [name], [name] 02/27/2022 10:15 A Deberah Pelton ([id number]) [id number] Page 7 of 8 Medical Record Number: [id number] Patient Account Number: [id number] Date of Birth/Sex: Treating RN: 1945/10/30 (76 y.o. M) Primary Care Blakley Michna: Billey Gosling Other Clinician: Referring Cyerra Yim: Treating Arinze Rivadeneira/Extender: Mickle Asper in Treatment: 16 Wound Status Wound Number: 1 Primary Diabetic Wound/Ulcer of the Lower Extremity Etiology: Wound Location: Right, Lateral Lower Leg Wound Open Wounding Event: Gradually Appeared Status: Date Acquired: 08/15/2021 Comorbid Cataracts, Sleep Apnea, Congestive Heart Failure, Hypertension, Weeks Of Treatment: 16 History: Myocardial Infarction, Type II Diabetes, Gout Clustered Wound: No Photos Wound Measurements Length: (cm) 5 Width: (cm) 5.4 Depth: (cm) 0.1 Area: (cm) 21.206 Volume: (cm) 2.121 %  Reduction in Area: 1.8% % Reduction in Volume: 1.8% Epithelialization: Medium (34-66%) Tunneling: No Undermining: No Wound Description Classification: Grade 2 Wound Margin: Distinct, outline attached Exudate Amount: Medium Exudate Type: Serosanguineous Exudate Color: red,  brown Foul Odor After Cleansing: No Slough/Fibrino Yes Wound Bed Granulation Amount: Large (67-100%) Exposed Structure Granulation Quality: Red, Pink Fascia Exposed: No Necrotic Amount: Small (1-33%) Fat Layer (Subcutaneous Tissue) Exposed: Yes Necrotic Quality: Adherent Slough Tendon Exposed: No Muscle Exposed: No Joint Exposed: No Bone Exposed: No Periwound Skin Texture Texture Color No Abnormalities Noted: No No Abnormalities Noted: No Callus: No Atrophie Blanche: No Crepitus: No Cyanosis: No Excoriation: No Ecchymosis: No Induration: No Erythema: No Rash: No Hemosiderin Staining: No Scarring: No Mottled: No Pallor: No Moisture Rubor: No No Abnormalities Noted: No Dry / Scaly: No Temperature / Pain Maceration: No Temperature: No Abnormality Tenderness on Palpation: Yes Treatment Notes Wound #1 (Lower Leg) Wound Laterality: Right, Lateral Cleanser Soap and Water Discharge Instruction: May shower and wash wound with dial antibacterial soap and water prior to dressing change. VALERIO, PINARD ([id number]) [id number] Page 8 of 8 Wound Cleanser Discharge Instruction: Cleanse the wound with wound cleanser prior to applying a clean dressing using gauze sponges, not tissue or cotton balls. Peri-Wound Care Topical collagen and blastx Discharge Instruction: apply over the topical compounding antibiotics. [facility]-topical compounding antibiotics Discharge Instruction: apply directly to wound bed. Primary Dressing Fibracol Plus Dressing, 4x4.38 in (collagen) Discharge Instruction: Moisten collagen with saline or hydrogel until the blastx and powder collagen  arrive. Secondary Dressing ABD Pad, 8x10 Discharge Instruction: Apply over primary dressing as directed. Woven Gauze Sponge, Non-Sterile 4x4 in Discharge Instruction: Apply over primary dressing as directed. Secured With Compression Wrap ThreePress (3 layer compression wrap) Discharge Instruction: Apply three layer compression as directed. Compression Stockings Add-Ons Electronic Signature(s) Signed: 02/27/2022 1:40:46 PM By: Erenest Blank Entered By: Erenest Blank on 02/27/2022 10:10:42 -------------------------------------------------------------------------------- Vitals Details Patient Name: Date of Service: Corey [name], [name] 02/27/2022 10:15 A M Medical Record Number: [id number] Patient Account Number: [id number] Date of Birth/Sex: Treating RN: [birth year] (76 y.o. M) Primary Care Jearl Soto: Billey Gosling Other Clinician: Referring Brennan Litzinger: Treating Berthold Glace/Extender: Mickle Asper in Treatment: 16 Vital Signs Time Taken: 09:54 Temperature (F): 97.6 Height (in): 66 Pulse (bpm): 51 Weight (lbs): 145 Respiratory Rate (breaths/min): 18 Body Mass Index (BMI): 23.4 Blood Pressure (mmHg): 156/76 Reference Range: 80 - 120 mg / dl Electronic Signature(s) Signed: 02/27/2022 1:40:46 PM By: Erenest Blank Entered By: Erenest Blank on 02/27/2022 09:55:11

## 2022-02-28 ENCOUNTER — Other Ambulatory Visit: Payer: Self-pay | Admitting: *Deleted

## 2022-02-28 DIAGNOSIS — N185 Chronic kidney disease, stage 5: Secondary | ICD-10-CM

## 2022-03-03 ENCOUNTER — Ambulatory Visit (HOSPITAL_BASED_OUTPATIENT_CLINIC_OR_DEPARTMENT_OTHER): Payer: Medicare HMO | Admitting: Internal Medicine

## 2022-03-06 ENCOUNTER — Encounter (HOSPITAL_BASED_OUTPATIENT_CLINIC_OR_DEPARTMENT_OTHER): Payer: Medicare HMO | Admitting: Internal Medicine

## 2022-03-06 DIAGNOSIS — Z87891 Personal history of nicotine dependence: Secondary | ICD-10-CM | POA: Diagnosis not present

## 2022-03-06 DIAGNOSIS — I25119 Atherosclerotic heart disease of native coronary artery with unspecified angina pectoris: Secondary | ICD-10-CM | POA: Diagnosis not present

## 2022-03-06 DIAGNOSIS — I5042 Chronic combined systolic (congestive) and diastolic (congestive) heart failure: Secondary | ICD-10-CM | POA: Diagnosis not present

## 2022-03-06 DIAGNOSIS — L97918 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 with other specified severity: Secondary | ICD-10-CM | POA: Diagnosis not present

## 2022-03-06 DIAGNOSIS — N184 Chronic kidney disease, stage 4 (severe): Secondary | ICD-10-CM | POA: Diagnosis not present

## 2022-03-06 DIAGNOSIS — I87311 Chronic venous hypertension (idiopathic) with ulcer of right lower extremity: Secondary | ICD-10-CM | POA: Diagnosis not present

## 2022-03-06 DIAGNOSIS — I130 Hypertensive heart and chronic kidney disease with heart failure and stage 1 through stage 4 chronic kidney disease, or unspecified chronic kidney disease: Secondary | ICD-10-CM | POA: Diagnosis not present

## 2022-03-06 DIAGNOSIS — E11622 Type 2 diabetes mellitus with other skin ulcer: Secondary | ICD-10-CM | POA: Diagnosis not present

## 2022-03-06 DIAGNOSIS — E1122 Type 2 diabetes mellitus with diabetic chronic kidney disease: Secondary | ICD-10-CM | POA: Diagnosis not present

## 2022-03-07 DIAGNOSIS — C61 Malignant neoplasm of prostate: Secondary | ICD-10-CM | POA: Diagnosis not present

## 2022-03-07 NOTE — Telephone Encounter (Signed)
**Note De-Identified Corey [name] Obfuscation** Per letter from [facility] Chonda Baney fax, they have approved the pt for Eliquis assistance until 05/14/2022. [id number]  The letter states that they have notified the pt as well.

## 2022-03-09 DIAGNOSIS — C61 Malignant neoplasm of prostate: Secondary | ICD-10-CM | POA: Diagnosis not present

## 2022-03-09 NOTE — Progress Notes (Signed)
DEAIRE, MCWHIRTER ([id number]) [id number] Page 1 of 8 Visit Report for 03/06/2022 Arrival Information Details Patient Name: Date of Service: Corey [name]. 03/06/2022 10:15 A M Medical Record Number: [id number] Patient Account Number: [id number] Date of Birth/Sex: Treating RN: Sep 21, 1945 (76 y.o. M) Primary Care Chou Busler: Billey Gosling Other Clinician: Referring Angelina Neece: Treating Izaias Krupka/Extender: Mickle Asper in Treatment: 42 Visit Information History Since Last Visit Added or deleted any medications: No Patient Arrived: Walker Any new allergies or adverse reactions: No Arrival Time: 10:44 Had a fall or experienced change in No Accompanied By: wife activities of daily living that may affect Transfer Assistance: None risk of falls: Patient Identification Verified: Yes Signs or symptoms of abuse/neglect since last visito No Secondary Verification Process Completed: Yes Hospitalized since last visit: No Patient Requires Transmission-Based No Implantable device outside of the clinic excluding No Precautions: cellular tissue based products placed in the center Patient Has Alerts: Yes since last visit: Patient Alerts: in clinic R [place] Has Compression in Place as Prescribed: Yes 11/17/2021 ABIs: both [place] Pain Present Now: No 11/17/2021 TBI: R0.64 L0.58 Electronic Signature(s) Signed: 03/06/2022 4:47:44 PM By: Erenest Blank Entered By: Erenest Blank on 03/06/2022 10:44:45 -------------------------------------------------------------------------------- Compression Therapy Details Patient Name: Date of Service: Corey [name], [name] 03/06/2022 10:15 A M Medical Record Number: [id number] Patient Account Number: [id number] Date of Birth/Sex: Treating RN: [birth year] (76 y.o. [name] Primary Care Carloyn Lahue: Billey Gosling Other Clinician: Referring Elaisha Zahniser: Treating Kallum Jorgensen/Extender: Mickle Asper in  Treatment: 17 Compression Therapy Performed for Wound Assessment: Wound #1 Right,Lateral Lower Leg Performed By: Clinician Rhae Hammock, RN Compression Type: Three Layer Post Procedure Diagnosis Same as Pre-procedure Electronic Signature(s) Signed: 03/09/2022 4:44:37 PM By: Rhae Hammock RN Entered By: Rhae Hammock on 03/06/2022 11:14:30 [name] ([id number]) [id number] Page 2 of 8 -------------------------------------------------------------------------------- Encounter Discharge Information Details Patient Name: Date of Service: Corey [name]. 03/06/2022 10:15 A M Medical Record Number: [id number] Patient Account Number: [id number] Date of Birth/Sex: Treating RN: 01-08-1946 (76 y.o. [name] Primary Care Ketura Sirek: Billey Gosling Other Clinician: Referring Tremain Rucinski: Treating Dawayne Ohair/Extender: Mickle Asper in Treatment: 17 Encounter Discharge Information Items Post Procedure Vitals Discharge Condition: Stable Temperature (F): 97.7 Ambulatory Status: Ambulatory Pulse (bpm): 74 Discharge Destination: Home Respiratory Rate (breaths/min): 17 Transportation: Private Auto Blood Pressure (mmHg): 147/90 Accompanied By: self Schedule Follow-up Appointment: Yes Clinical Summary of Care: Patient Declined Electronic Signature(s) Signed: 03/09/2022 4:44:37 PM By: Rhae Hammock RN Entered By: Rhae Hammock on 03/06/2022 11:15:09 -------------------------------------------------------------------------------- Lower Extremity Assessment Details Patient Name: Date of Service: Corey [name]. 03/06/2022 10:15 A M Medical Record Number: [id number] Patient Account Number: [id number] Date of Birth/Sex: Treating RN: 25-Apr-1946 (76 y.o. M) Primary Care Martyn Timme: Billey Gosling Other Clinician: Referring Sahib Pella: Treating Corey [name]/Extender: [name] in Treatment: 17 Edema  Assessment Assessed: [Left: No] [Right: No] Edema: [Left: Ye] [Right: s] Calf Left: Right: Point of Measurement: 33 cm From Medial Instep 38.5 cm Ankle Left: Right: Point of Measurement: 11 cm From Medial Instep 21 cm Electronic Signature(s) Signed: 03/06/2022 4:47:44 PM By: Erenest Blank Entered By: Erenest Blank on 03/06/2022 10:58:41 -------------------------------------------------------------------------------- Multi Wound Chart Details Patient Name: Date of Service: Corey [name], [name] 03/06/2022 10:15 A M Medical Record Number: [id number] Patient Account Number: [id number] Date of Birth/Sex: Treating RN: 10-29-1945 (76 y.o. M) Primary Care Massie Mees: Billey Gosling Other Clinician: Referring Siona Coulston: Treating Keaten Mashek/Extender: Heber Gibbon  Nelwyn Salisbury, Stacy Weeks in Treatment: [id number] [place] ([id number]) [id number] Page 3 of 8 Vital Signs Height(in): 66 Pulse(bpm): 50 Weight(lbs): 145 Blood Pressure(mmHg): 137/80 Body Mass Index(BMI): 23.4 Temperature(F): 97.6 Respiratory Rate(breaths/min): 18 [1:Photos:] [N/A:N/A] Right, Lateral Lower Leg N/A N/A Wound Location: Gradually Appeared N/A N/A Wounding Event: Diabetic Wound/Ulcer of the Lower N/A N/A Primary Etiology: Extremity Cataracts, Sleep Apnea, Congestive N/A N/A Comorbid History: Heart Failure, Hypertension, Myocardial Infarction, Type II Diabetes, Gout 08/15/2021 N/A N/A Date Acquired: 17 N/A N/A Weeks of Treatment: Open N/A N/A Wound Status: No N/A N/A Wound Recurrence: 5x3.1x0.1 N/A N/A Measurements L x W x D (cm) 12.174 N/A N/A A (cm) : rea 1.217 N/A N/A Volume (cm) : 43.60% N/A N/A % Reduction in A rea: 43.70% N/A N/A % Reduction in Volume: Grade 2 N/A N/A Classification: Medium N/A N/A Exudate A mount: Serosanguineous N/A N/A Exudate Type: red, brown N/A N/A Exudate Color: Distinct, outline attached N/A N/A Wound Margin: Large (67-100%) N/A  N/A Granulation A mount: Red, Pink N/A N/A Granulation Quality: Small (1-33%) N/A N/A Necrotic A mount: Fat Layer (Subcutaneous Tissue): Yes N/A N/A Exposed Structures: Fascia: No Tendon: No Muscle: No Joint: No Bone: No Medium (34-66%) N/A N/A Epithelialization: Debridement - Excisional N/A N/A Debridement: Pre-procedure Verification/Time Out 11:05 N/A N/A Taken: Lidocaine N/A N/A Pain Control: Subcutaneous, Slough N/A N/A Tissue Debrided: Skin/Subcutaneous Tissue N/A N/A Level: 15.5 N/A N/A Debridement A (sq cm): rea Curette N/A N/A Instrument: Minimum N/A N/A Bleeding: Pressure N/A N/A Hemostasis A chieved: 0 N/A N/A Procedural Pain: 0 N/A N/A Post Procedural Pain: Procedure was tolerated well N/A N/A Debridement Treatment Response: 5x3.1x0.1 N/A N/A Post Debridement Measurements L x W x D (cm) 1.217 N/A N/A Post Debridement Volume: (cm) Excoriation: No N/A N/A Periwound Skin Texture: Induration: No Callus: No Crepitus: No Rash: No Scarring: No Maceration: No N/A N/A Periwound Skin Moisture: Dry/Scaly: No Atrophie Blanche: No N/A N/A Periwound Skin Color: Cyanosis: No Ecchymosis: No Erythema: No Hemosiderin Staining: No Mottled: No Pallor: No Rubor: No No Abnormality N/A N/A TemperatureJESSE, Corey [name] ([id number]) [id number] Page 4 of 8 Yes N/A N/A Tenderness on Palpation: Compression Therapy N/A N/A Procedures Performed: Debridement Treatment Notes Wound #1 (Lower Leg) Wound Laterality: Right, Lateral Cleanser Soap and Water Discharge Instruction: May shower and wash wound with dial antibacterial soap and water prior to dressing change. Wound Cleanser Discharge Instruction: Cleanse the wound with wound cleanser prior to applying a clean dressing using gauze sponges, not tissue or cotton balls. Peri-Wound Care Topical collagen and blastx Discharge Instruction: apply over the topical compounding  antibiotics. [facility]-topical compounding antibiotics Discharge Instruction: apply directly to wound bed. Primary Dressing Fibracol Plus Dressing, 4x4.38 in (collagen) Discharge Instruction: Moisten collagen with saline or hydrogel until the blastx and powder collagen arrive. Secondary Dressing ABD Pad, 8x10 Discharge Instruction: Apply over primary dressing as directed. Woven Gauze Sponge, Non-Sterile 4x4 in Discharge Instruction: Apply over primary dressing as directed. Secured With Compression Wrap ThreePress (3 layer compression wrap) Discharge Instruction: Apply three layer compression as directed. Compression Stockings Add-Ons Electronic Signature(s) Signed: 03/06/2022 3:32:17 PM By: Kalman Shan DO Entered By: Kalman Shan on 03/06/2022 11:38:18 -------------------------------------------------------------------------------- Multi-Disciplinary Care Plan Details Patient Name: Date of Service: Corey [name], [name] 03/06/2022 10:15 A M Medical Record Number: [id number] Patient Account Number: [id number] Date of Birth/Sex: Treating RN: [birth year] (76 y.o. [name] Primary Care Tarae Wooden: Billey Gosling Other Clinician: Referring Dazani Norby: Treating Avonne Berkery/Extender: Lucile Crater  Weeks in Treatment: 17 Active Inactive Necrotic Tissue Nursing Diagnoses: Knowledge deficit related to management of necrotic/devitalized tissue Goals: Corey [name], Corey [name] ([id number]) [id number] Page 5 of 8 Necrotic/devitalized tissue will be minimized in the wound bed Date Initiated: 11/01/2021 Target Resolution Date: 03/18/2022 Goal Status: Active Patient/caregiver will verbalize understanding of reason and process for debridement of necrotic tissue Date Initiated: 11/01/2021 Date Inactivated: 12/05/2021 Target Resolution Date: 12/09/2021 Goal Status: Met Interventions: Assess patient pain level pre-, during and post procedure and prior  to discharge Provide education on necrotic tissue and debridement process Treatment Activities: Apply topical anesthetic as ordered : 11/01/2021 Enzymatic debridement : 11/01/2021 Excisional debridement : 11/01/2021 Notes: Pain, Acute or Chronic Nursing Diagnoses: Pain, acute or chronic: actual or potential Potential alteration in comfort, pain Goals: Patient will verbalize adequate pain control and receive pain control interventions during procedures as needed Date Initiated: 11/01/2021 Date Inactivated: 11/21/2021 Target Resolution Date: 12/09/2021 Goal Status: Met Patient/caregiver will verbalize comfort level met Date Initiated: 11/01/2021 Target Resolution Date: 03/18/2022 Goal Status: Active Interventions: Complete pain assessment as per visit requirements Provide education on pain management Treatment Activities: Administer pain control measures as ordered : 11/01/2021 Notes: Electronic Signature(s) Signed: 03/09/2022 4:44:37 PM By: Rhae Hammock RN Entered By: Rhae Hammock on 03/06/2022 11:12:22 -------------------------------------------------------------------------------- Pain Assessment Details Patient Name: Date of Service: Corey [name], [name] 03/06/2022 10:15 A M Medical Record Number: [id number] Patient Account Number: [id number] Date of Birth/Sex: Treating RN: 08/28/1945 (76 y.o. M) Primary Care Kadesia Robel: Billey Gosling Other Clinician: Referring Konstance Happel: Treating Eian Vandervelden/Extender: Mickle Asper in Treatment: 17 Active Problems Location of Pain Severity and Description of Pain Patient Has Paino No Site Locations JAZPER, NIKOLAI [place] ([id number]) [id number] Page 6 of 8 Pain Management and Medication Current Pain Management: Electronic Signature(s) Signed: 03/06/2022 4:47:44 PM By: Erenest Blank Entered By: Erenest Blank on 03/06/2022  10:46:27 -------------------------------------------------------------------------------- Patient/Caregiver Education Details Patient Name: Date of Service: Corey [name], Corey [name] 10/23/2023andnbsp10:15 A M Medical Record Number: [id number] Patient Account Number: [id number] Date of Birth/Gender: Treating RN: 1945/10/28 (76 y.o. [name] Primary Care Physician: Billey Gosling Other Clinician: Referring Physician: Treating Physician/Extender: Mickle Asper in Treatment: 17 Education Assessment Education Provided To: Patient Education Topics Provided Wound Debridement: Methods: Explain/Verbal Responses: Reinforcements needed, State content correctly Electronic Signature(s) Signed: 03/09/2022 4:44:37 PM By: Rhae Hammock RN Entered By: Rhae Hammock on 03/06/2022 11:10:25 -------------------------------------------------------------------------------- Wound Assessment Details Patient Name: Date of Service: Corey [name], [name] 03/06/2022 10:15 A M Medical Record Number: [id number] Patient Account Number: [id number] Date of Birth/Sex: Treating RN: [birth year] (76 y.o. M) Primary Care Merdis Snodgrass: Billey Gosling Other Clinician: Referring Derian Dimalanta: Treating Corey [name]/Extender: Kingjames, Coury ([id number]) [id number] Page 7 of 8 Weeks in Treatment: 17 Wound Status Wound Number: 1 Primary Diabetic Wound/Ulcer of the Lower Extremity Etiology: Wound Location: Right, Lateral Lower Leg Wound Open Wounding Event: Gradually Appeared Status: Date Acquired: 08/15/2021 Comorbid Cataracts, Sleep Apnea, Congestive Heart Failure, Hypertension, Weeks Of Treatment: 17 History: Myocardial Infarction, Type II Diabetes, Gout Clustered Wound: No Photos Wound Measurements Length: (cm) 5 Width: (cm) 3.1 Depth: (cm) 0.1 Area: (cm) 12.174 Volume: (cm) 1.217 % Reduction in Area: 43.6% % Reduction in Volume:  43.7% Epithelialization: Medium (34-66%) Wound Description Classification: Grade 2 Wound Margin: Distinct, outline attached Exudate Amount: Medium Exudate Type: Serosanguineous Exudate Color: red, brown Foul Odor After Cleansing: No Slough/Fibrino Yes Wound Bed Granulation Amount: Large (67-100%) Exposed Structure Granulation Quality: Red, Pink  Fascia Exposed: No Necrotic Amount: Small (1-33%) Fat Layer (Subcutaneous Tissue) Exposed: Yes Necrotic Quality: Adherent Slough Tendon Exposed: No Muscle Exposed: No Joint Exposed: No Bone Exposed: No Periwound Skin Texture Texture Color No Abnormalities Noted: No No Abnormalities Noted: No Callus: No Atrophie Blanche: No Crepitus: No Cyanosis: No Excoriation: No Ecchymosis: No Induration: No Erythema: No Rash: No Hemosiderin Staining: No Scarring: No Mottled: No Pallor: No Moisture Rubor: No No Abnormalities Noted: No Dry / Scaly: No Temperature / Pain Maceration: No Temperature: No Abnormality Tenderness on Palpation: Yes Treatment Notes Wound #1 (Lower Leg) Wound Laterality: Right, Lateral Cleanser Soap and Water Discharge Instruction: May shower and wash wound with dial antibacterial soap and water prior to dressing change. Wound Cleanser Discharge Instruction: Cleanse the wound with wound cleanser prior to applying a clean dressing using gauze sponges, not tissue or cotton balls. Peri-Wound Care ARIHANT, PENNINGS ([id number]) [id number] Page 8 of 8 Topical collagen and blastx Discharge Instruction: apply over the topical compounding antibiotics. [facility]-topical compounding antibiotics Discharge Instruction: apply directly to wound bed. Primary Dressing Fibracol Plus Dressing, 4x4.38 in (collagen) Discharge Instruction: Moisten collagen with saline or hydrogel until the blastx and powder collagen arrive. Secondary Dressing ABD Pad, 8x10 Discharge Instruction: Apply over  primary dressing as directed. Woven Gauze Sponge, Non-Sterile 4x4 in Discharge Instruction: Apply over primary dressing as directed. Secured With Compression Wrap ThreePress (3 layer compression wrap) Discharge Instruction: Apply three layer compression as directed. Compression Stockings Add-Ons Electronic Signature(s) Signed: 03/06/2022 4:47:44 PM By: Erenest Blank Entered By: Erenest Blank on 03/06/2022 10:59:21 -------------------------------------------------------------------------------- Vitals Details Patient Name: Date of Service: Corey [name], [name] 03/06/2022 10:15 A M Medical Record Number: [id number] Patient Account Number: [id number] Date of Birth/Sex: Treating RN: 1946/04/04 (76 y.o. M) Primary Care Tsion Inghram: Billey Gosling Other Clinician: Referring Kaylise Blakeley: Treating Dajah Fischman/Extender: Mickle Asper in Treatment: 17 Vital Signs Time Taken: 10:44 Temperature (F): 97.6 Height (in): 66 Pulse (bpm): 50 Weight (lbs): 145 Respiratory Rate (breaths/min): 18 Body Mass Index (BMI): 23.4 Blood Pressure (mmHg): 137/80 Reference Range: 80 - 120 mg / dl Electronic Signature(s) Signed: 03/06/2022 4:47:44 PM By: Erenest Blank Entered By: Erenest Blank on 03/06/2022 10:46:20

## 2022-03-09 NOTE — Progress Notes (Signed)
GLADYS, GUTMAN ([id number]) [id number] Page 1 of 10 Visit Report for 03/06/2022 Chief Complaint Document Details Patient Name: Date of Service: Corey [name]. 03/06/2022 10:15 A M Medical Record Number: [id number] Patient Account Number: [id number] Date of Birth/Sex: Treating RN: 02-07-1946 (76 y.o. M) Primary Care Provider: Billey Gosling Other Clinician: Referring Provider: Treating Provider/Extender: Mickle Asper in Treatment: 17 Information Obtained from: Patient Chief Complaint 11/01/2021; right lower extremity wound Electronic Signature(s) Signed: 03/06/2022 3:32:17 PM By: Kalman Shan DO Entered By: Kalman Shan on 03/06/2022 11:38:23 -------------------------------------------------------------------------------- Debridement Details Patient Name: Date of Service: [name], [name] 03/06/2022 10:15 A M Medical Record Number: [id number] Patient Account Number: [id number] Date of Birth/Sex: Treating RN: Jun 19, 1945 (76 y.o. [name], [name] Primary Care Provider: Billey Gosling Other Clinician: Referring Provider: Treating Provider/Extender: Mickle Asper in Treatment: 17 Debridement Performed for Assessment: Wound #1 Right,Lateral Lower Leg Performed By: Physician Kalman Shan, DO Debridement Type: Debridement Severity of Tissue Pre Debridement: Fat layer exposed Level of Consciousness (Pre-procedure): Awake and Alert Pre-procedure Verification/Time Out Yes - 11:05 Taken: Start Time: 11:05 Pain Control: Lidocaine T Area Debrided (L x W): otal 5 (cm) x 3.1 (cm) = 15.5 (cm) Tissue and other material debrided: Viable, Non-Viable, Slough, Subcutaneous, Slough Level: Skin/Subcutaneous Tissue Debridement Description: Excisional Instrument: Curette Bleeding: Minimum Hemostasis Achieved: Pressure End Time: 11:05 Procedural Pain: 0 Post Procedural Pain: 0 Response to Treatment: Procedure  was tolerated well Level of Consciousness (Post- Awake and Alert procedure): Post Debridement Measurements of Total Wound Length: (cm) 5 Width: (cm) 3.1 Depth: (cm) 0.1 Volume: (cm) 1.217 Character of Wound/Ulcer Post Debridement: Improved Severity of Tissue Post Debridement: Fat layer exposed CHRISTORPHER, HISAW ([id number]) [id number] Page 2 of 10 Post Procedure Diagnosis Same as Pre-procedure Electronic Signature(s) Signed: 03/06/2022 3:32:17 PM By: Kalman Shan DO Signed: 03/09/2022 4:44:37 PM By: Rhae Hammock RN Entered By: Rhae Hammock on 03/06/2022 11:13:22 -------------------------------------------------------------------------------- HPI Details Patient Name: Date of Service: [name], [name] 03/06/2022 10:15 A M Medical Record Number: [id number] Patient Account Number: [id number] Date of Birth/Sex: Treating RN: 22-Jan-1946 (76 y.o. M) Primary Care Provider: Billey Gosling Other Clinician: Referring Provider: Treating Provider/Extender: Mickle Asper in Treatment: 17 History of Present Illness HPI Description: Admission 11/01/2021 Mr. Jailyn Langhorst Is a 76 year old male with a past medical history of of type 2 diabetes, coronary artery disease, stage IV kidney disease acute on chronic combined systolic and diastolic heart failure that presents to the clinic for a 63-monthhistory of right lower extremity wound. He states he is not sure how this started. It is completely eschared. He reports his wound has been stable for the past month. He has been keeping the area covered. He has mild chronic pain. He denies signs of infection. He states he has a history of lower extremity ulcers last being on the left leg. He could not tell me how this was treated in the past. 6/27; patient presents for follow-up. He is scheduled for his lower extremity arterial studies on July 6th. He has been using Medihoney to the wound bed. He has  no issues or complaints today. He denies signs of infection. 7/6; patient presents for follow-up. He has home health that reported the wound having odor and increased pain last week. He was sent to urgent care. He was started on doxycycline. He is continuing Medihoney. He has his scheduled ABIs w/TBIs today. 7/10; patient presents for follow-up. He had  his arterial studies done last week. He had triphasic waveforms throughout the right lower extremity. His TBI was 0.64. He has good pedal pulses. He is almost done with his course of antibiotics. He has been using mupirocin ointment to the wound bed. 7/17; patient presents for follow-up. He has been using Iodosorb under compression therapy. He has no issues or complaints today. He states he is receiving Keystone antibiotic tomorrow In the mail. 7/24; patient presents for follow-up. He has been doing Dakin's wet-to-dry dressings to the wound bed daily without issues. He currently denies signs of infection. He reports completing his course of doxycycline. He has his Keystone antibiotic with him today. 7/31; patient presents for follow-up. We have been using Iodoflex with [facility] under compression therapy. He has no issues or complaints today. He denies signs of infection. 8/7; patient presents for follow-up. We have been using Iodoflex with [facility] under compression therapy. Home health came out once last week to change the wrap. He has no issues or complaints today. He denies signs of infection. 8/14; patient presents for follow-up. We have been using Keystone with Hydrofera Blue under compression therapy. He has tolerated this well. He has no issues or complaints today. 8/21; patient presents for follow-up. We have been using Keystone with Hydrofera Blue under compression therapy. The Hydrofera Blue was stuck on tightly to the wound bed today. Other than that he has no issues or complaints. We have not heard back for insurance approval for skin  sub. 8/28; patient presents for follow-up. We ordered blast X and collagen powder at last clinic visit. It is unclear if he has received this. We placed Keystone with Fibracol under 3 layer compression last week. Patient has home health. Patient has no issues or complaints today. He was not approved for TheraSkin. 9/5; patient presents for follow-up. Insurance would not cover blast X. We do have a sample and he would like to try this. We have been using Keystone antibiotic and collagen under 3 layer compression. He has no issues or complaints today. 9/18; patient presents for follow-up. We have been using collagen with blast X and Keystone under compression therapy. He has no issues or complaints today. 9/25; patient presents for follow-up. We have continued to use collagen, blast X and Keystone under compression therapy. Patient has no issues or complaints today. 10/6; patient presents for follow-up. We have been using collagen, blast X and Keystone under compression therapy. He denies signs of infection and has no issues or complaints today. Patient has home health that comes out on Wednesdays and Fridays. 10/16; patient presents for follow-up. He has been using collagen, blast X and Keystone with benefit to the wound bed under compression therapy. He is about to run out of [facility] antibiotic ointment. He has no issues or complaints today. 10/23; patient presents for follow-up. We continue to use collagen, blast X and Keystone under compression therapy. Continued report in wound healing. Home health continues to come out to do dressing changes. Patient has no issues or complaints today. JAVONNIE, ILLESCAS ([id number]) [id number] Page 3 of 10 Electronic Signature(s) Signed: 03/06/2022 3:32:17 PM By: Kalman Shan DO Entered By: Kalman Shan on 03/06/2022 11:38:58 -------------------------------------------------------------------------------- Physical Exam  Details Patient Name: Date of Service: [name], [name] 03/06/2022 10:15 A M Medical Record Number: [id number] Patient Account Number: [id number] Date of Birth/Sex: Treating RN: [birth year] (76 y.o. M) Primary Care Provider: Billey Gosling Other Clinician: Referring Provider: Treating Provider/Extender: [name] in Treatment:  17 Constitutional respirations regular, non-labored and within target range for patient.. Cardiovascular 2+ dorsalis pedis/posterior tibialis pulses. Psychiatric pleasant and cooperative. Notes Right lower extremity: T the anterior aspect there is granulation tissue and nonviable tissue present. No signs of surrounding infection. Good edema control. o Electronic Signature(s) Signed: 03/06/2022 3:32:17 PM By: Kalman Shan DO Entered By: Kalman Shan on 03/06/2022 11:39:19 -------------------------------------------------------------------------------- Physician Orders Details Patient Name: Date of Service: [name], [name] 03/06/2022 10:15 A M Medical Record Number: [id number] Patient Account Number: [id number] Date of Birth/Sex: Treating RN: [birth year] (76 y.o. [name] Primary Care Provider: Billey Gosling Other Clinician: Referring Provider: Treating Provider/Extender: Mickle Asper in Treatment: 70 Verbal / Phone Orders: No Diagnosis Coding Follow-up Appointments ppointment in 1 week. - Dr. Heber Hamilton and Tammi Klippel, Room 8 Return A Other: - ****[place] topical compounding antibiotics bring to appt each time.**** Bring in the Blastx as well. Anesthetic (In clinic) Topical Lidocaine 5% applied to wound bed (In clinic) Topical Lidocaine 4% applied to wound bed Cellular or Tissue Based Products Cellular or Tissue Based Product Type: - run insurance for [employer], epicord, and organogenesis advance tissue product. Theraskin not approved. Epicord not covered. [employer] Apligraf  approved. Bathing/ Shower/ Hygiene May shower with protection but do not get wound dressing(s) wet. STRIDER, VALLANCE ([id number]) [id number] Page 4 of 10 Edema Control - Lymphedema / SCD / Other Elevate legs to the level of the heart or above for 30 minutes daily and/or when sitting, a frequency of: - 3-4 times a day throughout the day. Avoid standing for long periods of time. Home Health No change in wound care orders this week; continue Home Health for wound care. May utilize formulary equivalent dressing for wound treatment orders unless otherwise specified. - home health to twice a week. Compounding topical antibiotics directly to wound bed. Then Place collagen and Blastx. Use plain collagen over the topical antibiotics. Other Home Health Orders/Instructions: - [facility] Wound Treatment Wound #1 - Lower Leg Wound Laterality: Right, Lateral Cleanser: Soap and Water (Home Health) 3 x Per Week/30 Days Discharge Instructions: May shower and wash wound with dial antibacterial soap and water prior to dressing change. Cleanser: Wound Cleanser (Home Health) 3 x Per Week/30 Days Discharge Instructions: Cleanse the wound with wound cleanser prior to applying a clean dressing using gauze sponges, not tissue or cotton balls. Topical: collagen and blastx (Home Health) 3 x Per Week/30 Days Discharge Instructions: apply over the topical compounding antibiotics. Topical: [facility]-topical compounding antibiotics (Home Health) 3 x Per Week/30 Days Discharge Instructions: apply directly to wound bed. Prim Dressing: Fibracol Plus Dressing, 4x4.38 in (collagen) (Home Health) 3 x Per Week/30 Days ary Discharge Instructions: Moisten collagen with saline or hydrogel until the blastx and powder collagen arrive. Secondary Dressing: ABD Pad, 8x10 (Home Health) 3 x Per Week/30 Days Discharge Instructions: Apply over primary dressing as directed. Secondary Dressing: Woven  Gauze Sponge, Non-Sterile 4x4 in (Home Health) 3 x Per Week/30 Days Discharge Instructions: Apply over primary dressing as directed. Compression Wrap: ThreePress (3 layer compression wrap) 3 x Per Week/30 Days Discharge Instructions: Apply three layer compression as directed. Electronic Signature(s) Signed: 03/06/2022 3:32:17 PM By: Kalman Shan DO Entered By: Kalman Shan on 03/06/2022 11:39:29 -------------------------------------------------------------------------------- Problem List Details Patient Name: Date of Service: [name], [name] 03/06/2022 10:15 A M Medical Record Number: [id number] Patient Account Number: [id number] Date of Birth/Sex: Treating RN: 1945-07-01 (76 y.o. M) Primary Care Provider: Quay Burow,  [place]ley.Kava Other Clinician: Referring Provider: Treating Provider/Extender: Mickle Asper in Treatment: 17 Active Problems ICD-10 Encounter Code Description Active Date MDM Diagnosis L97.918 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 with other 11/01/2021 No Yes specified severity E11.622 Type 2 diabetes mellitus with other skin ulcer 11/01/2021 No Yes I50.42 Chronic combined systolic (congestive) and diastolic (congestive) heart failure 11/01/2021 No Yes ALEXANDE, SHEERIN ([id number]) [id number] Page 5 of 10 I25.119 Atherosclerotic heart disease of native coronary artery with unspecified angina 11/01/2021 No Yes pectoris I87.311 Chronic venous hypertension (idiopathic) with ulcer of right lower extremity 12/26/2021 No Yes Inactive Problems Resolved Problems Electronic Signature(s) Signed: 03/06/2022 3:32:17 PM By: Kalman Shan DO Entered By: Kalman Shan on 03/06/2022 11:38:15 -------------------------------------------------------------------------------- Progress Note Details Patient Name: Date of Service: [name], [name] 03/06/2022 10:15 A M Medical Record Number: [id number] Patient  Account Number: [id number] Date of Birth/Sex: Treating RN: Jun 16, 1945 (76 y.o. M) Primary Care Provider: Billey Gosling Other Clinician: Referring Provider: Treating Provider/Extender: Mickle Asper in Treatment: 17 Subjective Chief Complaint Information obtained from Patient 11/01/2021; right lower extremity wound History of Present Illness (HPI) Admission 11/01/2021 Mr. Aadith Raudenbush Is a 76 year old male with a past medical history of of type 2 diabetes, coronary artery disease, stage IV kidney disease acute on chronic combined systolic and diastolic heart failure that presents to the clinic for a 68-monthhistory of right lower extremity wound. He states he is not sure how this started. It is completely eschared. He reports his wound has been stable for the past month. He has been keeping the area covered. He has mild chronic pain. He denies signs of infection. He states he has a history of lower extremity ulcers last being on the left leg. He could not tell me how this was treated in the past. 6/27; patient presents for follow-up. He is scheduled for his lower extremity arterial studies on July 6th. He has been using Medihoney to the wound bed. He has no issues or complaints today. He denies signs of infection. 7/6; patient presents for follow-up. He has home health that reported the wound having odor and increased pain last week. He was sent to urgent care. He was started on doxycycline. He is continuing Medihoney. He has his scheduled ABIs w/TBIs today. 7/10; patient presents for follow-up. He had his arterial studies done last week. He had triphasic waveforms throughout the right lower extremity. His TBI was 0.64. He has good pedal pulses. He is almost done with his course of antibiotics. He has been using mupirocin ointment to the wound bed. 7/17; patient presents for follow-up. He has been using Iodosorb under compression therapy. He has no issues or complaints today.  He states he is receiving Keystone antibiotic tomorrow In the mail. 7/24; patient presents for follow-up. He has been doing Dakin's wet-to-dry dressings to the wound bed daily without issues. He currently denies signs of infection. He reports completing his course of doxycycline. He has his Keystone antibiotic with him today. 7/31; patient presents for follow-up. [facility] He has no issues or complaints today. He denies signs of infection. 8/7; patient presents for follow-up. [facility] Home health came out once last week to change the wrap. He has no issues or complaints today. He denies signs of infection. 8/14; patient presents for follow-up. We have been using Keystone with Hydrofera Blue under compression  therapy. He has tolerated this well. He has no issues or complaints today. 8/21; patient presents for follow-up. We have been using Keystone with Hydrofera Blue under compression therapy. The Hydrofera Blue was stuck on tightly to the wound bed today. Other than that he has no issues or complaints. We have not heard back for insurance approval for skin sub. 8/28; patient presents for follow-up. We ordered blast X and collagen powder at last clinic visit. It is unclear if he has received this. We placed Keystone with Fibracol under 3 layer compression last week. Patient has home health. Patient has no issues or complaints today. He was not approved for TheraSkin. 9/5; patient presents for follow-up. Insurance would not cover blast X. We do have a sample and he would like to try this. We have been using MONTRAVIOUS, WEIGELT Z ([id number]) [id number] Page 6 of 10 antibiotic and collagen under 3 layer compression. He has no issues or complaints today. 9/18; patient presents for follow-up. We have been using collagen with blast X and Keystone under  compression therapy. He has no issues or complaints today. 9/25; patient presents for follow-up. We have continued to use collagen, blast X and Keystone under compression therapy. Patient has no issues or complaints today. 10/6; patient presents for follow-up. We have been using collagen, blast X and Keystone under compression therapy. He denies signs of infection and has no issues or complaints today. Patient has home health that comes out on Wednesdays and Fridays. 10/16; patient presents for follow-up. He has been using collagen, blast X and Keystone with benefit to the wound bed under compression therapy. He is about to run out of [facility] antibiotic ointment. He has no issues or complaints today. 10/23; patient presents for follow-up. We continue to use collagen, blast X and Keystone under compression therapy. Continued report in wound healing. Home health continues to come out to do dressing changes. Patient has no issues or complaints today. Patient History Family History Cancer - Father, Diabetes - Mother,Father, Heart Disease - Mother,Siblings, Hypertension - Mother,Father. Social History Former smoker - quit 1970, Marital Status - Married, Drug Use - No History, Caffeine Use - Moderate - coffee. Medical History Eyes Patient has history of Cataracts Respiratory Patient has history of Sleep Apnea - has CPAP does not wear Cardiovascular Patient has history of Congestive Heart Failure, Hypertension, Myocardial Infarction - 2014 Endocrine Patient has history of Type II Diabetes Musculoskeletal Patient has history of Gout Neurologic Denies history of Seizure Disorder Hospitalization/Surgery History - 06/2021 inguinal hernia repair. - cardioversion 09/2020. - ventral hernia repair 3/19. Medical A Surgical History Notes nd Constitutional Symptoms (General Health) CVA 2014 bilateral hearing loss Gastrointestinal Diverticulosis Genitourinary Renal insufficiency  20/ Oncologic Prostate Ca Objective Constitutional respirations regular, non-labored and within target range for patient.. Vitals Time Taken: 10:44 AM, Height: 66 in, Weight: 145 lbs, BMI: 23.4, Temperature: 97.6 F, Pulse: 50 bpm, Respiratory Rate: 18 breaths/min, Blood Pressure: 137/80 mmHg. Cardiovascular 2+ dorsalis pedis/posterior tibialis pulses. Psychiatric pleasant and cooperative. General Notes: Right lower extremity: T the anterior aspect there is granulation tissue and nonviable tissue present. No signs of surrounding infection. Good o edema control. Integumentary (Hair, Skin) Wound #1 status is Open. Original cause of wound was Gradually Appeared. The date acquired was: 08/15/2021. The wound has been in treatment 17 weeks. The wound is located on the Right,Lateral Lower Leg. The wound measures 5cm length x 3.1cm width x 0.1cm depth; 12.174cm^2 area and 1.217cm^3 volume. There is Fat Layer (Subcutaneous Tissue)  exposed. There is a medium amount of serosanguineous drainage noted. The wound margin is distinct with the outline attached to the wound base. There is large (67-100%) red, pink granulation within the wound bed. There is a small (1-33%) amount of necrotic tissue within the wound bed including Adherent Slough. The periwound skin appearance did not exhibit: Callus, Crepitus, Excoriation, Induration, Rash, Scarring, Dry/Scaly, Maceration, Atrophie Blanche, Cyanosis, Ecchymosis, Hemosiderin Staining, Mottled, Pallor, Rubor, Erythema. Periwound temperature was noted as No Abnormality. The periwound has tenderness on palpation. ARNOL, MCGIBBON ([id number]) [id number] Page 7 of 10 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 Type 2 diabetes mellitus with other skin ulcer Chronic combined systolic (congestive) and diastolic (congestive) heart failure Atherosclerotic  heart disease of native coronary artery with unspecified angina pectoris Chronic venous hypertension (idiopathic) with ulcer of right lower extremity Patient's wound has shown improvement in size and appearance since last clinic visit. I debrided nonviable tissue. I recommended continuing the course with collagen, blast X and Keystone antibiotic ointment under 3 layer compression. Follow-up in 1 week. Procedures Wound #1 Pre-procedure diagnosis of Wound #1 is a Diabetic Wound/Ulcer of the Lower Extremity located on the Right,Lateral Lower Leg .Severity of Tissue Pre Debridement is: Fat layer exposed. There was a Excisional Skin/Subcutaneous Tissue Debridement with a total area of 15.5 sq cm performed by Kalman Shan, DO. With the following instrument(s): Curette to remove Viable and Non-Viable tissue/material. Material removed includes Subcutaneous Tissue and Slough and after achieving pain control using Lidocaine. No specimens were taken. A time out was conducted at 11:05, prior to the start of the procedure. A Minimum amount of bleeding was controlled with Pressure. The procedure was tolerated well with a pain level of 0 throughout and a pain level of 0 following the procedure. Post Debridement Measurements: 5cm length x 3.1cm width x 0.1cm depth; 1.217cm^3 volume. Character of Wound/Ulcer Post Debridement is improved. Severity of Tissue Post Debridement is: Fat layer exposed. Post procedure Diagnosis Wound #1: Same as Pre-Procedure Pre-procedure diagnosis of Wound #1 is a Diabetic Wound/Ulcer of the Lower Extremity located on the Right,Lateral Lower Leg . There was a Three Layer Compression Therapy Procedure by Rhae Hammock, RN. Post procedure Diagnosis Wound #1: Same as Pre-Procedure Plan Follow-up Appointments: Return Appointment in 1 week. - Dr. Heber [place] and Tammi Klippel, Room 8 Other: - ****[facility] topical compounding antibiotics bring to appt each time.**** Bring in the Blastx  as well. Anesthetic: (In clinic) Topical Lidocaine 5% applied to wound bed (In clinic) Topical Lidocaine 4% applied to wound bed Cellular or Tissue Based Products: Cellular or Tissue Based Product Type: - run insurance for [employer], epicord, and organ[employer] advance tissue product. Theraskin not approved. Epicord not covered. [employer] Apligraf approved. Bathing/ Shower/ Hygiene: May shower with protection but do not get wound dressing(s) wet. Edema Control - Lymphedema / SCD / Other: Elevate legs to the level of the heart or above for 30 minutes daily and/or when sitting, a frequency of: - 3-4 times a day throughout the day. Avoid standing for long periods of time. Home Health: No change in wound care orders this week; continue Home Health for wound care. May utilize formulary equivalent dressing for wound treatment orders unless otherwise specified. - home health to twice a week. Compounding topical antibiotics directly to wound bed. Then Place collagen and Blastx. Use plain collagen over the topical antibiotics. Other Home Health Orders/Instructions: - [facility] WOUND #1: - Lower  Leg Wound Laterality: Right, Lateral Cleanser: Soap and Water (Home Health) 3 x Per Week/30 Days Discharge Instructions: May shower and wash wound with dial antibacterial soap and water prior to dressing change. Cleanser: Wound Cleanser (Home Health) 3 x Per Week/30 Days Discharge Instructions: Cleanse the wound with wound cleanser prior to applying a clean dressing using gauze sponges, not tissue or cotton balls. Topical: collagen and blastx (Home Health) 3 x Per Week/30 Days Discharge Instructions: apply over the topical compounding antibiotics. Topical: [facility]-topical compounding antibiotics (Home Health) 3 x Per Week/30 Days Discharge Instructions: apply directly to wound bed. Prim Dressing: Fibracol Plus Dressing, 4x4.38 in (collagen) (Home Health) 3 x Per Week/30  Days ary Discharge Instructions: Moisten collagen with saline or hydrogel until the blastx and powder collagen arrive. Secondary Dressing: ABD Pad, 8x10 (Home Health) 3 x Per Week/30 Days Discharge Instructions: Apply over primary dressing as directed. Secondary Dressing: Woven Gauze Sponge, Non-Sterile 4x4 in (Home Health) 3 x Per Week/30 Days Discharge Instructions: Apply over primary dressing as directed. Com pression Wrap: ThreePress (3 layer compression wrap) 3 x Per Week/30 Days Discharge Instructions: Apply three layer compression as directed. 1. In office sharp debridement 2. Collagen, blast X and [facility] antibiotic under 3 layer compression 3. Follow-up in 1 week OLIN, GURSKI ([id number]) [id number] Page 8 of 10 Electronic Signature(s) Signed: 03/06/2022 3:32:17 PM By: Kalman Shan DO Entered By: Kalman Shan on 03/06/2022 11:40:20 -------------------------------------------------------------------------------- HxROS Details Patient Name: Date of Service: [name], [name] 03/06/2022 10:15 A M Medical Record Number: [id number] Patient Account Number: [id number] Date of Birth/Sex: Treating RN: March 23, 1946 (76 y.o. M) Primary Care Provider: Billey Gosling Other Clinician: Referring Provider: Treating Provider/Extender: [name] in Treatment: 17 Constitutional Symptoms (General Health) Medical History: Past Medical History Notes: CVA 2014 bilateral hearing loss Eyes Medical History: Positive for: Cataracts Respiratory Medical History: Positive for: Sleep Apnea - has CPAP does not wear Cardiovascular Medical History: Positive for: Congestive Heart Failure; Hypertension; Myocardial Infarction - 2014 Gastrointestinal Medical History: Past Medical History Notes: Diverticulosis Endocrine Medical History: Positive for: Type II Diabetes Time with diabetes: >20 years Treated with: Oral agents, Diet Blood  sugar tested every day: Yes Tested : BID Genitourinary Medical History: Past Medical History Notes: Renal insufficiency 20/ Musculoskeletal Medical History: Positive for: Gout Neurologic Medical History: Negative for: Seizure Disorder Oncologic JERNARD, REIBER ([id number]) [id number] Page 9 of 10 Medical History: Past Medical History Notes: Prostate Ca HBO Extended History Items Eyes: Cataracts Immunizations Pneumococcal Vaccine: Received Pneumococcal Vaccination: No Implantable Devices No devices added Hospitalization / Surgery History Type of Hospitalization/Surgery 06/2021 inguinal hernia repair cardioversion 09/2020 ventral hernia repair 3/19 Family and Social History Cancer: Yes - Father; Diabetes: Yes - Mother,Father; Heart Disease: Yes - Mother,Siblings; Hypertension: Yes - Mother,Father; Former smoker - quit 1970; Marital Status - Married; Drug Use: No History; Caffeine Use: Moderate - coffee; Financial Concerns: No; Food, Clothing or Shelter Needs: No; Support System Lacking: No; Transportation Concerns: No Electronic Signature(s) Signed: 03/06/2022 3:32:17 PM By: Kalman Shan DO Entered By: Kalman Shan on 03/06/2022 11:39:04 -------------------------------------------------------------------------------- SuperBill Details Patient Name: Date of Service: [name], [name] 03/06/2022 Medical Record Number: [id number] Patient Account Number: [id number] Date of Birth/Sex: Treating RN: Oct 03, 1945 (76 y.o. 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 with other specified severity E11.622 Type 2 diabetes mellitus with other skin ulcer I50.42 Chronic combined systolic (congestive) and  diastolic (congestive) heart failure I25.119 Atherosclerotic heart disease of native coronary artery with unspecified angina pectoris I87.311 Chronic venous hypertension (idiopathic) with ulcer of right lower extremity Facility Procedures : CPT4 Code: [id number] Description: 71696 - DEB SUBQ TISSUE 20 SQ CM/< ICD-10 Diagnosis Description L97.918 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 with other spe Modifier: cified severity Quantity: 1 Physician Procedures : CPT4 Code Description Modifier [id number] 17510 - WC PHYS SUBQ TISS 20 SQ CM ICD-10 Diagnosis Description L97.918 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 with other specified severity SAVEON, PLANT ([id number])  [id number] Page Quantity: 1 10 of 10 Electronic Signature(s) Signed: 03/06/2022 3:32:17 PM By: Kalman Shan DO Entered By: Kalman Shan on 03/06/2022 11:40:26

## 2022-03-10 DIAGNOSIS — Z9181 History of falling: Secondary | ICD-10-CM

## 2022-03-10 DIAGNOSIS — N189 Chronic kidney disease, unspecified: Secondary | ICD-10-CM | POA: Diagnosis not present

## 2022-03-10 DIAGNOSIS — E1122 Type 2 diabetes mellitus with diabetic chronic kidney disease: Secondary | ICD-10-CM | POA: Diagnosis not present

## 2022-03-10 DIAGNOSIS — I872 Venous insufficiency (chronic) (peripheral): Secondary | ICD-10-CM | POA: Diagnosis not present

## 2022-03-10 DIAGNOSIS — Z7984 Long term (current) use of oral hypoglycemic drugs: Secondary | ICD-10-CM

## 2022-03-10 DIAGNOSIS — E1151 Type 2 diabetes mellitus with diabetic peripheral angiopathy without gangrene: Secondary | ICD-10-CM | POA: Diagnosis not present

## 2022-03-10 DIAGNOSIS — I130 Hypertensive heart and chronic kidney disease with heart failure and stage 1 through stage 4 chronic kidney disease, or unspecified chronic kidney disease: Secondary | ICD-10-CM | POA: Diagnosis not present

## 2022-03-10 DIAGNOSIS — I5022 Chronic systolic (congestive) heart failure: Secondary | ICD-10-CM | POA: Diagnosis not present

## 2022-03-10 DIAGNOSIS — L97811 Non-pressure chronic ulcer of other part of right lower leg limited to breakdown of skin: Secondary | ICD-10-CM | POA: Diagnosis not present

## 2022-03-10 DIAGNOSIS — H9190 Unspecified hearing loss, unspecified ear: Secondary | ICD-10-CM

## 2022-03-10 DIAGNOSIS — I69311 Memory deficit following cerebral infarction: Secondary | ICD-10-CM

## 2022-03-10 DIAGNOSIS — M1A30X0 Chronic gout due to renal impairment, unspecified site, without tophus (tophi): Secondary | ICD-10-CM | POA: Diagnosis not present

## 2022-03-10 DIAGNOSIS — Z4800 Encounter for change or removal of nonsurgical wound dressing: Secondary | ICD-10-CM | POA: Diagnosis not present

## 2022-03-13 ENCOUNTER — Encounter (HOSPITAL_BASED_OUTPATIENT_CLINIC_OR_DEPARTMENT_OTHER): Payer: Medicare HMO | Admitting: Internal Medicine

## 2022-03-13 DIAGNOSIS — L97918 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 with other specified severity: Secondary | ICD-10-CM | POA: Diagnosis not present

## 2022-03-13 DIAGNOSIS — E1122 Type 2 diabetes mellitus with diabetic chronic kidney disease: Secondary | ICD-10-CM | POA: Diagnosis not present

## 2022-03-13 DIAGNOSIS — E11622 Type 2 diabetes mellitus with other skin ulcer: Secondary | ICD-10-CM | POA: Diagnosis not present

## 2022-03-13 DIAGNOSIS — I25119 Atherosclerotic heart disease of native coronary artery with unspecified angina pectoris: Secondary | ICD-10-CM | POA: Diagnosis not present

## 2022-03-13 DIAGNOSIS — I5042 Chronic combined systolic (congestive) and diastolic (congestive) heart failure: Secondary | ICD-10-CM | POA: Diagnosis not present

## 2022-03-13 DIAGNOSIS — Z87891 Personal history of nicotine dependence: Secondary | ICD-10-CM | POA: Diagnosis not present

## 2022-03-13 DIAGNOSIS — I130 Hypertensive heart and chronic kidney disease with heart failure and stage 1 through stage 4 chronic kidney disease, or unspecified chronic kidney disease: Secondary | ICD-10-CM | POA: Diagnosis not present

## 2022-03-13 DIAGNOSIS — I87311 Chronic venous hypertension (idiopathic) with ulcer of right lower extremity: Secondary | ICD-10-CM | POA: Diagnosis not present

## 2022-03-13 DIAGNOSIS — N184 Chronic kidney disease, stage 4 (severe): Secondary | ICD-10-CM | POA: Diagnosis not present

## 2022-03-15 ENCOUNTER — Ambulatory Visit (HOSPITAL_COMMUNITY)
Admission: RE | Admit: 2022-03-15 | Discharge: 2022-03-15 | Disposition: A | Discharge status: Home / Self Care | Payer: Medicare HMO | Source: Ambulatory Visit | Attending: Vascular Surgery | Admitting: Vascular Surgery

## 2022-03-15 ENCOUNTER — Ambulatory Visit (INDEPENDENT_AMBULATORY_CARE_PROVIDER_SITE_OTHER): Payer: Medicare HMO | Admitting: Physician Assistant

## 2022-03-15 ENCOUNTER — Other Ambulatory Visit: Payer: Self-pay | Admitting: Internal Medicine

## 2022-03-15 VITALS — BP 147/75 | HR 51 | Temp 97.6°F | Ht 66.0 in | Wt 173.0 lb

## 2022-03-15 DIAGNOSIS — N185 Chronic kidney disease, stage 5: Secondary | ICD-10-CM | POA: Diagnosis not present

## 2022-03-15 DIAGNOSIS — N184 Chronic kidney disease, stage 4 (severe): Secondary | ICD-10-CM

## 2022-03-15 NOTE — Progress Notes (Signed)
    Postoperative Access Visit   History of Present Illness   Corey [name] is a 76 y.o. year old male who presents for postoperative follow-up for: left brachiocephalic arteriovenous fistula (Date: 01/31/22).  The patient's wounds are healed.  The patient denies steal symptoms.  The patient is able to complete their activities of daily living.  He is not yet on HD.   Physical Examination   Vitals:   03/15/22 1137  BP: (!) 147/75  Pulse: (!) 51  Temp: 97.6 F (36.4 C)  TempSrc: Temporal  SpO2: 100%  Weight: 173 lb (78.5 kg)  Height: '5\' 6"'$  (1.676 m)   Body mass index is 27.92 kg/m.  left arm Incision is healed, palpable radial pulse, hand grip is 5/5, sensation in digits is intact, palpable thrill, bruit can be auscultated     Medical Decision Making   Corey [name] is a 76 y.o. year old male who presents s/p left brachiocephalic arteriovenous fistula  Patent L AVF without signs or symptoms of steal syndrome Duplex  shows some intimal thickening near the anastomosis; diameter in mid upper arm around 4-4.40m Recheck duplex in 1 month.  Exercise ball provided.  He will need a TDC if HD is initiated before 3 months post operatively   MDagoberto LigasPA-C [facility] GTexhomaOffice: 3Raysal ClinicMD: CDonzetta Matters

## 2022-03-21 ENCOUNTER — Other Ambulatory Visit: Payer: Self-pay

## 2022-03-21 DIAGNOSIS — N185 Chronic kidney disease, stage 5: Secondary | ICD-10-CM

## 2022-03-22 ENCOUNTER — Encounter (HOSPITAL_COMMUNITY): Payer: Self-pay

## 2022-03-22 ENCOUNTER — Inpatient Hospital Stay (HOSPITAL_COMMUNITY)
Admission: EM | Admit: 2022-03-22 | Discharge: 2022-03-29 | DRG: 286 | Disposition: A | Discharge status: Home Health | Payer: Medicare HMO | Attending: Internal Medicine | Admitting: Internal Medicine

## 2022-03-22 ENCOUNTER — Other Ambulatory Visit: Payer: Self-pay

## 2022-03-22 ENCOUNTER — Emergency Department (HOSPITAL_COMMUNITY): Payer: Medicare HMO

## 2022-03-22 ENCOUNTER — Encounter: Payer: Self-pay | Admitting: Physician Assistant

## 2022-03-22 ENCOUNTER — Ambulatory Visit: Payer: Medicare HMO | Attending: Physician Assistant | Admitting: Nurse Practitioner

## 2022-03-22 ENCOUNTER — Telehealth: Payer: Self-pay | Admitting: Cardiology

## 2022-03-22 VITALS — BP 147/78 | HR 65 | Ht 66.0 in | Wt 178.8 lb

## 2022-03-22 DIAGNOSIS — M109 Gout, unspecified: Secondary | ICD-10-CM | POA: Diagnosis present

## 2022-03-22 DIAGNOSIS — M25561 Pain in right knee: Secondary | ICD-10-CM | POA: Diagnosis not present

## 2022-03-22 DIAGNOSIS — Z8546 Personal history of malignant neoplasm of prostate: Secondary | ICD-10-CM

## 2022-03-22 DIAGNOSIS — M25461 Effusion, right knee: Secondary | ICD-10-CM | POA: Diagnosis not present

## 2022-03-22 DIAGNOSIS — R600 Localized edema: Secondary | ICD-10-CM | POA: Diagnosis not present

## 2022-03-22 DIAGNOSIS — Z8249 Family history of ischemic heart disease and other diseases of the circulatory system: Secondary | ICD-10-CM

## 2022-03-22 DIAGNOSIS — Z8674 Personal history of sudden cardiac arrest: Secondary | ICD-10-CM

## 2022-03-22 DIAGNOSIS — I130 Hypertensive heart and chronic kidney disease with heart failure and stage 1 through stage 4 chronic kidney disease, or unspecified chronic kidney disease: Secondary | ICD-10-CM | POA: Diagnosis present

## 2022-03-22 DIAGNOSIS — N250 Renal osteodystrophy: Secondary | ICD-10-CM | POA: Diagnosis not present

## 2022-03-22 DIAGNOSIS — E854 Organ-limited amyloidosis: Secondary | ICD-10-CM | POA: Diagnosis present

## 2022-03-22 DIAGNOSIS — I509 Heart failure, unspecified: Secondary | ICD-10-CM | POA: Diagnosis not present

## 2022-03-22 DIAGNOSIS — N179 Acute kidney failure, unspecified: Secondary | ICD-10-CM | POA: Diagnosis present

## 2022-03-22 DIAGNOSIS — I4892 Unspecified atrial flutter: Secondary | ICD-10-CM | POA: Diagnosis present

## 2022-03-22 DIAGNOSIS — I480 Paroxysmal atrial fibrillation: Secondary | ICD-10-CM | POA: Diagnosis present

## 2022-03-22 DIAGNOSIS — Z79899 Other long term (current) drug therapy: Secondary | ICD-10-CM

## 2022-03-22 DIAGNOSIS — R0902 Hypoxemia: Secondary | ICD-10-CM | POA: Diagnosis not present

## 2022-03-22 DIAGNOSIS — Z87891 Personal history of nicotine dependence: Secondary | ICD-10-CM

## 2022-03-22 DIAGNOSIS — Z8673 Personal history of transient ischemic attack (TIA), and cerebral infarction without residual deficits: Secondary | ICD-10-CM

## 2022-03-22 DIAGNOSIS — E869 Volume depletion, unspecified: Secondary | ICD-10-CM | POA: Diagnosis present

## 2022-03-22 DIAGNOSIS — I252 Old myocardial infarction: Secondary | ICD-10-CM

## 2022-03-22 DIAGNOSIS — E1122 Type 2 diabetes mellitus with diabetic chronic kidney disease: Secondary | ICD-10-CM | POA: Diagnosis present

## 2022-03-22 DIAGNOSIS — I2510 Atherosclerotic heart disease of native coronary artery without angina pectoris: Secondary | ICD-10-CM | POA: Diagnosis not present

## 2022-03-22 DIAGNOSIS — R4781 Slurred speech: Secondary | ICD-10-CM | POA: Diagnosis not present

## 2022-03-22 DIAGNOSIS — I2489 Other forms of acute ischemic heart disease: Secondary | ICD-10-CM | POA: Diagnosis present

## 2022-03-22 DIAGNOSIS — R609 Edema, unspecified: Secondary | ICD-10-CM | POA: Diagnosis not present

## 2022-03-22 DIAGNOSIS — N184 Chronic kidney disease, stage 4 (severe): Secondary | ICD-10-CM | POA: Diagnosis present

## 2022-03-22 DIAGNOSIS — I4891 Unspecified atrial fibrillation: Secondary | ICD-10-CM | POA: Diagnosis not present

## 2022-03-22 DIAGNOSIS — I43 Cardiomyopathy in diseases classified elsewhere: Secondary | ICD-10-CM | POA: Diagnosis present

## 2022-03-22 DIAGNOSIS — D631 Anemia in chronic kidney disease: Secondary | ICD-10-CM | POA: Diagnosis present

## 2022-03-22 DIAGNOSIS — M7989 Other specified soft tissue disorders: Secondary | ICD-10-CM | POA: Diagnosis not present

## 2022-03-22 DIAGNOSIS — I10 Essential (primary) hypertension: Secondary | ICD-10-CM

## 2022-03-22 DIAGNOSIS — Z9889 Other specified postprocedural states: Secondary | ICD-10-CM | POA: Diagnosis present

## 2022-03-22 DIAGNOSIS — Z8042 Family history of malignant neoplasm of prostate: Secondary | ICD-10-CM

## 2022-03-22 DIAGNOSIS — I110 Hypertensive heart disease with heart failure: Secondary | ICD-10-CM | POA: Diagnosis not present

## 2022-03-22 DIAGNOSIS — S81801A Unspecified open wound, right lower leg, initial encounter: Secondary | ICD-10-CM | POA: Diagnosis present

## 2022-03-22 DIAGNOSIS — I5020 Unspecified systolic (congestive) heart failure: Secondary | ICD-10-CM | POA: Diagnosis not present

## 2022-03-22 DIAGNOSIS — X58XXXA Exposure to other specified factors, initial encounter: Secondary | ICD-10-CM | POA: Diagnosis present

## 2022-03-22 DIAGNOSIS — E785 Hyperlipidemia, unspecified: Secondary | ICD-10-CM | POA: Diagnosis present

## 2022-03-22 DIAGNOSIS — I959 Hypotension, unspecified: Secondary | ICD-10-CM | POA: Diagnosis not present

## 2022-03-22 DIAGNOSIS — R001 Bradycardia, unspecified: Secondary | ICD-10-CM | POA: Diagnosis present

## 2022-03-22 DIAGNOSIS — Z7901 Long term (current) use of anticoagulants: Secondary | ICD-10-CM

## 2022-03-22 DIAGNOSIS — I5040 Unspecified combined systolic (congestive) and diastolic (congestive) heart failure: Secondary | ICD-10-CM | POA: Diagnosis not present

## 2022-03-22 DIAGNOSIS — G4733 Obstructive sleep apnea (adult) (pediatric): Secondary | ICD-10-CM | POA: Diagnosis present

## 2022-03-22 DIAGNOSIS — R0602 Shortness of breath: Secondary | ICD-10-CM | POA: Diagnosis not present

## 2022-03-22 DIAGNOSIS — E119 Type 2 diabetes mellitus without complications: Secondary | ICD-10-CM

## 2022-03-22 DIAGNOSIS — Z1152 Encounter for screening for COVID-19: Secondary | ICD-10-CM | POA: Diagnosis not present

## 2022-03-22 DIAGNOSIS — E872 Acidosis, unspecified: Secondary | ICD-10-CM | POA: Diagnosis present

## 2022-03-22 DIAGNOSIS — N186 End stage renal disease: Secondary | ICD-10-CM | POA: Diagnosis not present

## 2022-03-22 DIAGNOSIS — I5043 Acute on chronic combined systolic (congestive) and diastolic (congestive) heart failure: Secondary | ICD-10-CM | POA: Diagnosis present

## 2022-03-22 DIAGNOSIS — E859 Amyloidosis, unspecified: Secondary | ICD-10-CM | POA: Diagnosis not present

## 2022-03-22 DIAGNOSIS — R9431 Abnormal electrocardiogram [ECG] [EKG]: Secondary | ICD-10-CM | POA: Diagnosis not present

## 2022-03-22 DIAGNOSIS — Z888 Allergy status to other drugs, medicaments and biological substances status: Secondary | ICD-10-CM

## 2022-03-22 DIAGNOSIS — I25119 Atherosclerotic heart disease of native coronary artery with unspecified angina pectoris: Secondary | ICD-10-CM | POA: Diagnosis present

## 2022-03-22 DIAGNOSIS — R29818 Other symptoms and signs involving the nervous system: Secondary | ICD-10-CM | POA: Diagnosis not present

## 2022-03-22 DIAGNOSIS — E1110 Type 2 diabetes mellitus with ketoacidosis without coma: Secondary | ICD-10-CM | POA: Diagnosis not present

## 2022-03-22 DIAGNOSIS — I5021 Acute systolic (congestive) heart failure: Secondary | ICD-10-CM | POA: Diagnosis not present

## 2022-03-22 DIAGNOSIS — Z833 Family history of diabetes mellitus: Secondary | ICD-10-CM

## 2022-03-22 DIAGNOSIS — Z86010 Personal history of colonic polyps: Secondary | ICD-10-CM

## 2022-03-22 DIAGNOSIS — M898X9 Other specified disorders of bone, unspecified site: Secondary | ICD-10-CM | POA: Diagnosis present

## 2022-03-22 LAB — CBC WITH DIFFERENTIAL/PLATELET
Abs Immature Granulocytes: 0.01 10*3/uL (ref 0.00–0.07)
Basophils Absolute: 0 10*3/uL (ref 0.0–0.1)
Basophils Relative: 1 %
Eosinophils Absolute: 0 10*3/uL (ref 0.0–0.5)
Eosinophils Relative: 0 %
HCT: 36.4 % — ABNORMAL LOW (ref 39.0–52.0)
Hemoglobin: 11.6 g/dL — ABNORMAL LOW (ref 13.0–17.0)
Immature Granulocytes: 0 %
Lymphocytes Relative: 22 %
Lymphs Abs: 0.6 10*3/uL — ABNORMAL LOW (ref 0.7–4.0)
MCH: 28 pg (ref 26.0–34.0)
MCHC: 31.9 g/dL (ref 30.0–36.0)
MCV: 87.7 fL (ref 80.0–100.0)
Monocytes Absolute: 0.4 10*3/uL (ref 0.1–1.0)
Monocytes Relative: 16 %
Neutro Abs: 1.6 10*3/uL — ABNORMAL LOW (ref 1.7–7.7)
Neutrophils Relative %: 61 %
Platelets: 103 10*3/uL — ABNORMAL LOW (ref 150–400)
RBC: 4.15 MIL/uL — ABNORMAL LOW (ref 4.22–5.81)
RDW: 17.1 % — ABNORMAL HIGH (ref 11.5–15.5)
WBC: 2.7 10*3/uL — ABNORMAL LOW (ref 4.0–10.5)
nRBC: 0 % (ref 0.0–0.2)

## 2022-03-22 LAB — BASIC METABOLIC PANEL
Anion gap: 11 (ref 5–15)
BUN: 70 mg/dL — ABNORMAL HIGH (ref 8–23)
CO2: 19 mmol/L — ABNORMAL LOW (ref 22–32)
Calcium: 9.6 mg/dL (ref 8.9–10.3)
Chloride: 113 mmol/L — ABNORMAL HIGH (ref 98–111)
Creatinine, Ser: 4.58 mg/dL — ABNORMAL HIGH (ref 0.61–1.24)
GFR, Estimated: 13 mL/min — ABNORMAL LOW (ref >60)
Glucose, Bld: 78 mg/dL (ref 70–99)
Potassium: 3.8 mmol/L (ref 3.5–5.1)
Sodium: 143 mmol/L (ref 135–145)

## 2022-03-22 LAB — BRAIN NATRIURETIC PEPTIDE: B Natriuretic Peptide: 1571.3 pg/mL — ABNORMAL HIGH (ref 0.0–100.0)

## 2022-03-22 NOTE — Telephone Encounter (Signed)
Pt's wife states that she would like a call back to discuss fluid that was found in patients lungs. She states that the hospital where he was taken states they do not see this and she is concerned they will be releasing him with fluid still on him. Requesting call back.

## 2022-03-22 NOTE — ED Triage Notes (Signed)
Went to [facility] bc past two weeks has had increased SOB and fatigue.  SOB with exertion.  Wife reports slurred speech x 2.  Patient has hx of stroke and afib.  HR46-50 irregular.  RLL diminished 134/74 RR22 100% RA  recently had HD cath placed due to dx of renal failure and needing dialysis.

## 2022-03-22 NOTE — Patient Instructions (Signed)
You are being sent to the Hospital.  We hope you feel better VERY SOON :)

## 2022-03-22 NOTE — Telephone Encounter (Signed)
Spoke with the patient's wife who states that the patient is still in the ED but she is concerned that they are going to let him go without being treated. I reassured her that they will not send him home until they feel like he is stable and has been treated. She verbalized understanding.

## 2022-03-22 NOTE — Progress Notes (Cosign Needed Addendum)
Cardiology Office Note:    Date:  03/22/2022   ID:  Corey [name], DOB 13-Jul-1945, MRN [id number]  PCP:  [name], MD   [facility] Cardiologist:  Candee Furbish, MD     Referring MD: [name], MD   CC: Worsening shortness of breath  History of Present Illness:    Corey [name] is a 76 y.o. male with a hx of the following  Hx of prior pericardial effusion s/p window Hx of CAD Amloid heart disease HFrEF HTN HLD T2DM History of stroke OSA ESRD (CKD stage 5), newly L arm AVF  Previously evaluated for cardiac amyloidosis on tafamidis with prior pericardial effusion status post window with history of CAD and combined systolic and diastolic heart failure with prior PEA arrest.  Cardiac history is also notable for hypertension, hyperlipidemia, type 2 diabetes, stroke, OSA, cognitive impairment, and prostate cancer.  In May 2022 he was having DOE and possible anginal symptoms.  Underwent cardiac catheterization and found to have 80% occlusion of LAD with severely elevated LVEDP.  Symptoms were more compatible with pressure volume overload and was started on diuretics.  Echo in May 2022 revealed EF 35 to 40%, had a large pericardial effusion at the time.  Underwent pericardial window and biopsies were taken that were compatible with amyloidosis.  However, serum and urine studies were not compatible with AL amyloidosis.  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  2 of his brothers have heart failure with 1 deceased.  1 sister died from heart failure.  Has 1 son without any issues.  Lost a significant amount of weight with diuresis, torsemide was decreased.  Weight was steady at last office visit with heart failure team.    In May 2023 his wife called office stating he was short of breath, wife noted a 15 pound weight gain over 2 weeks.  Was previously advised by Richardson Dopp, PA-C that it was okay to take extra  torsemide 20 mg for increased swelling, or shortness of breath.  He was going to take additional dose and weigh himself next day.  Last seen by Dr. Marlou Porch on January 12, 2022 still noting shortness of breath with activity, was stable over the past few months. Newly discovered A-fib, HR 45 bpm at appt with Dr. Marlou Porch, was started on Eliquis.   Today he presents for follow-up.  He presents today with his wife.  Chief complaint today is worsening shortness of breath since last OV.  Says he was normally able to walk around the block before getting short of breath, now he only has to walk from room to room at home and gets short of breath.  States weight has been increasing recently.  He is up 5 pounds within the past week.  Wife is concerned regarding his speech, says it has been slurred and has a harder time speaking within the past 2 weeks and this was his presentation when he had a stroke many years ago.  Wife says he seems more confused in past 2 weeks.  Pt states left hand is weak, and hard for him to straighten.  Denies any chest pain, palpitations, syncope, presyncope, dizziness, claudication, or bleeding.  Does have history of chronic swelling in lower extremities, right greater than left.  Recently had AV fistula placed along left arm for history of end-stage renal disease.  Denies any other questions or concerns today.   Past Medical History:  Diagnosis Date   Cataract    CHF (congestive heart failure) (HCC)    Chronic heart failure with preserved ejection fraction (HFpEF) (HCC)    Colon polyp 2003   Dr [name]; F/U was to be 2008( not completed)   Coronary artery disease    CVA (cerebral infarction) 2014   Diabetes mellitus    Diverticulosis 2003   Dyspnea    with exertion   Gout    Hereditary cardiac amyloidosis (HCC)    Hypertension    Myocardial infarction ([place]) 09/19/2012   PEA cardiac arrest in setting of acute respiratory failure/pulmonary edema   OSA (obstructive sleep apnea)  03/27/2018   Pneumonia 09/29/2011   Avelox X 10 days as OP   Prostate cancer ([place])    Renal insufficiency    Seizures ([place]) 09/19/2012   not treated for seizure disorder; had a seizure after stroke 2014; no seizure since then   Stroke [facility])     Past Surgical History:  Procedure Laterality Date   AV FISTULA PLACEMENT Left 01/31/2022   Procedure: LEFT ARM ARTERIOVENOUS (AV) FISTULA CREATION;  Surgeon: Waynetta Sandy, MD;  Location: [facility];  Service: Vascular;  Laterality: Left;  regional block to left arm   COLONOSCOPY W/ POLYPECTOMY  2003   no F/U ([place] discussed 12/03/12)   INGUINAL HERNIA REPAIR Left 06/16/2021   Procedure: LAPAROSCOPIC, POSSIBLY OPEN LEFT INGUINAL HERNIA REPAIR WITH MESH;  Surgeon: Felicie Morn, MD;  Location: [facility];  Service: General;  Laterality: Left;   INSERTION OF MESH N/A 07/16/2017   Procedure: INSERTION OF MESH;  Surgeon: [name], MD;  Location: [facility] OR;  Service: General;  Laterality: N/A;   PEG PLACEMENT  2014   10/08/12-12/10/12   PEG TUBE REMOVAL  2014   PROSTATE BIOPSY     RIGHT/LEFT HEART CATH AND CORONARY ANGIOGRAPHY N/A 09/20/2020   Procedure: RIGHT/LEFT HEART CATH AND CORONARY ANGIOGRAPHY;  Surgeon: Leonie Man, MD;  Location: [facility];  Service: Cardiovascular;  Laterality: N/A;   TEE WITHOUT CARDIOVERSION N/A 09/27/2020   Procedure: TRANSESOPHAGEAL ECHOCARDIOGRAM (TEE);  Surgeon: Wonda Olds, MD;  Location: [facility];  Service: Thoracic;  Laterality: N/A;   TRACHEOSTOMY  2014   09/30/12-10/20/12   VENTRAL HERNIA REPAIR N/A 07/16/2017   Procedure: LAPAROSCOPIC VENTRAL HERNIA REPAIR WITH MESH;  Surgeon: [name], MD;  Location: [facility];  Service: General;  Laterality: N/A;   wrist aspiration  02/16/2012    monosodium urate crystals; Dr Caralyn Guile    Current Medications: Current Meds  Medication Sig   ACCU-CHEK AVIVA PLUS test strip TEST BLOOD SUGAR TWICE DAILY AS DIRECTED   ACCU-CHEK AVIVA PLUS test  strip Use as instructed   albuterol (VENTOLIN HFA) 108 (90 Base) MCG/ACT inhaler Inhale 2 puffs into the lungs every 4 (four) hours as needed for wheezing or shortness of breath.   allopurinol (ZYLOPRIM) 100 MG tablet TAKE 1/2 TABLET ONE TIME DAILY   apixaban (ELIQUIS) 5 MG TABS tablet Take 1 tablet (5 mg total) by mouth 2 (two) times daily.   bismuth subsalicylate (PEPTO BISMOL) 262 MG/15ML suspension Take 30 mLs by mouth every 6 (six) hours as needed for indigestion.   colchicine (COLCRYS) 0.6 MG tablet Take 2 tabs once and then one tab one hour later as needed for gout   ergocalciferol (VITAMIN D2) 1.25 MG (50000 UT) capsule Take 50,000 Units by mouth once a week.   glipiZIDE (GLUCOTROL XL) 2.5 MG 24 hr tablet Take  1 tablet (2.5 mg total) by mouth daily with breakfast.   hydrALAZINE (APRESOLINE) 50 MG tablet Take 2 tablets (100 mg total) by mouth 3 (three) times daily.   JANUVIA 25 MG tablet Take 1 tablet by mouth once daily   nitroGLYCERIN (NITROSTAT) 0.4 MG SL tablet Place 0.4 mg under the tongue every 5 (five) minutes as needed for chest pain.   Tafamidis (VYNDAMAX) 61 MG CAPS Take 61 mg by mouth daily.   torsemide (DEMADEX) 20 MG tablet Patient take 20 mg tablet by mouth every Monday, Wednesday, and friday   TRUEplus Lancets 33G MISC USE 4 (FOUR) TIMES DAILY     Allergies:   Hydrochlorothiazide   Social History   Socioeconomic History   Marital status: Married    Spouse name: [name]   Number of children: 2   Years of education: 12   Highest education level: 12th grade  Occupational History   Not on file  Tobacco Use   Smoking status: Former    Packs/day: 0.25    Years: 1.00    Total pack years: 0.25    Types: Cigarettes    Quit date: 05/15/1968    Years since quitting: 53.8   Smokeless tobacco: Never   Tobacco comments:    smoked [place], up to 1-2 cigarettes/ day  Vaping Use   Vaping Use: Never used  Substance and Sexual Activity   Alcohol use: No     Alcohol/week: 0.0 standard drinks of alcohol   Drug use: No   Sexual activity: Yes  Other Topics Concern   Not on file  Social History Narrative   Walks three times a week, 2-3 miles, occasional walks on treadmill       Patient is right-handed. He lives with his wife in a one level home. He drinks 1 diet Mt. Dew a day and rarely has coffee. He walks 3 x a week for exercise.   Social Determinants of Health   Financial Resource Strain: Low Risk  (02/02/2022)   Overall Financial Resource Strain (CARDIA)    Difficulty of Paying Living Expenses: Not hard at all  Food Insecurity: No Food Insecurity (02/02/2022)   Hunger Vital Sign    Worried About Running Out of Food in the Last Year: Never true    Ran Out of Food in the Last Year: Never true  Transportation Needs: No Transportation Needs (02/02/2022)   PRAPARE - Hydrologist (Medical): No    Lack of Transportation (Non-Medical): No  Physical Activity: Inactive (02/02/2022)   Exercise Vital Sign    Days of Exercise per Week: 0 days    Minutes of Exercise per Session: 0 min  Stress: No Stress Concern Present (02/02/2022)       Feeling of Stress : Not at all  Social Connections: [place] (02/02/2022)   Social Connection and Isolation Panel [NHANES]    Frequency of Communication with Friends and Family: More than three times a week    Frequency of Social Gatherings with Friends and Family: More than three times a week    Attends Religious Services: More than 4 times per year    Active Member of [employer] or Organizations: Yes    Attends Music therapist: More than 4 times per year    Marital Status: Married     Family History: The patient's family history includes Cancer in his father; Diabetes in his father and mother; Heart  failure in his brother and mother; Hypertension in his father and mother; Prostate cancer in  his father. There is no history of Stroke, Heart disease, or Colon cancer.  ROS:   Review of Systems  Constitutional: Negative.   HENT: Negative.    Eyes: Negative.   Respiratory:  Positive for shortness of breath. Negative for cough, hemoptysis, sputum production and wheezing.   Cardiovascular:  Positive for leg swelling. Negative for chest pain, palpitations, orthopnea, claudication and PND.  Gastrointestinal: Negative.   Genitourinary: Negative.   Musculoskeletal: Negative.   Skin: Negative.   Neurological:  Positive for speech change. Negative for dizziness, tingling, tremors, sensory change, focal weakness, seizures, loss of consciousness, weakness and headaches.       See HPI.   Endo/Heme/Allergies: Negative.  Negative for environmental allergies and polydipsia. Does not bruise/bleed easily.  Psychiatric/Behavioral:  Positive for memory loss. Negative for depression, hallucinations, substance abuse and suicidal ideas. The patient is not nervous/anxious and does not have insomnia.        Recent confusion within the past 2 weeks.  See HPI.    Please see the history of present illness.    All other systems reviewed and are negative.  EKGs/Labs/Other Studies Reviewed:    The following studies were reviewed today:   EKG:  EKG is ordered today.  The ekg ordered today demonstrates A-flutter, variable AV block, premature ventricular or aberrantly conducted complexes, nonspecific IV block, nonspecific T wave abnormality.   2 view chest x-ray on January 05, 2022: Stable, mild left basilar atelectasis or scarring is noted.  Myocardial amyloid imaging plan RNs back on December 31, 2020: Findings are equivocal for TTR amyloidosis.  Visual score is 1 and H/CLL ratio is 1.4.     2D limited echo on Sep 30, 2020: 1. Trace pericardial fluid posterior and around right AV groove. Compared  to prior study 09/21/20, pericardial effusion has essentially resolved.   2. Left ventricular ejection  fraction, by estimation, is 40%. The left  ventricle has moderately decreased function. The left ventricle  demonstrates global hypokinesis. There is severe left ventricular  hypertrophy.   3. Right ventricular systolic function is moderately reduced. The right  ventricular size is normal. Severely increased right ventricular wall  thickness.   4. Trivial mitral valve regurgitation.   5. The inferior vena cava is dilated in size with <50% respiratory  variability, suggesting right atrial pressure of 15 mmHg.  2D complete echo on Sep 21, 2020: 1. There is no left ventricular thrombus with Definity contrast. Left  ventricular ejection fraction, by estimation, is 35 to 40%. The left  ventricle has moderately decreased function. The left ventricle  demonstrates regional wall motion abnormalities  (see scoring diagram/findings for description). There is global left  ventricular hypokinesis, disproportionately severe in the mid-apical  anterior and anterolateral walls and apex. The anterior septum has  relatively preserved contractility. There is  severe concentric left ventricular hypertrophy. Left ventricular diastolic  parameters are consistent with Grade II diastolic dysfunction  (pseudonormalization). Elevated left atrial pressure.   2. Right ventricular systolic function is moderately reduced. The right  ventricular size is normal. Mildly increased right ventricular wall  thickness. There is moderately elevated pulmonary artery systolic  pressure.   3. Left atrial size was severely dilated.   4. Right atrial size was mildly dilated.   5. Large pericardial effusion. The pericardial effusion is  circumferential. There is no evidence of cardiac tamponade.   6. The mitral valve is normal  in structure. Mild mitral valve  regurgitation.   7. The aortic valve is tricuspid. Aortic valve regurgitation is not  visualized. No aortic stenosis is present.   8. The inferior vena cava is dilated  in size with <50% respiratory  variability, suggesting right atrial pressure of 15 mmHg.   Comparison(s): The left ventricular function is significantly worse. The  left ventricular wall motion abnormality is worse. The pericardial  effusion is slightly larger. There are no overt findings to support  tamponade, other than inferior vena cava  dilation. However, the sensitivity of echo for the diagnosis of  pericardial tamponade is diminished in the setting of pulmonary artery  hypertension.   Right and left heart cath on Sep 20, 2020: Hemodynamic findings consistent with moderate pulmonary hypertension. LV end diastolic pressure is severely elevated. There is no aortic valve stenosis. Mid LAD to Dist LAD lesion is 80% stenosed. Ramus lesion is 20% stenosed.   SUMMARY Severe single-vessel disease with heavily calcified mid LAD 80% stenosis Acute Combined Systolic and Diastolic Heart Failure (systolic function appears to be reduced, to conserve contrast, LV gram not performed), but LVEDP 32 mmHg and PCWP 28 mmHg. Systemic Hypertension with Mean Arterial Pressure 123 mmHg Secondary Pulmonary Hypertension: PAP 67/19 mmHg-mean 39 mmHg with RVP-EDP of 67/2 mmHg-15 mmHg, RVP and RAP of 14 mmHg. Moderate to severely reduced Cardiac Output-Index: 4.04-2.09     RECOMMENDATIONS With significant CHF, orthopnea and PND with LVEDP of 32 mmHg, patient is not stable for LAD PCI which would likely require atherectomy. We will admit to cardiology service for titration of cardiac meds and diuresis: Converting losartan to [facility], otherwise continue home medications. IV Lasix 40 mg twice daily Check 2D echo Reassess volume status in the next 1 to 2 days to determine feasibility for staged LAD PCI (via femoral access due to extreme tortuosity of the innominate artery.)   Slight scale insulin; consider adding Wilder Glade or Tereso Newcomer on Oct 10, 2019:  Nuclear stress EF: 37%. The left ventricular  ejection fraction is moderately decreased (30-44%). The LV is severely dilated. There was no ST segment deviation noted during stress. This is an intermediate risk study. There is no evidence of ischemia or previous infarction   Recent Labs: 01/05/2022: ALT 31; Platelets 128.0; Pro B Natriuretic peptide (BNP) 1,740.0 01/31/2022: BUN 52; Creatinine, Ser 3.10; Hemoglobin 11.2; Potassium 3.8; Sodium 141  Recent Lipid Panel    Component Value Date/Time   CHOL 88 11/23/2020 1056   TRIG 64.0 11/23/2020 1056   HDL 41.90 11/23/2020 1056   CHOLHDL 2 11/23/2020 1056   VLDL 12.8 11/23/2020 1056   LDLCALC 34 11/23/2020 1056   LDLCALC 61 01/28/2020 1000     Risk Assessment/Calculations:    CHA2DS2-VASc Score = 6  This indicates a 9.7% annual risk of stroke. The patient's score is based upon: CHF History: 1 HTN History: 1 Diabetes History: 1 Stroke History: 2 Vascular Disease History: 0 Age Score: 1 Gender Score: 0  Physical Exam:    VS:  BP (!) 147/78   Pulse 65   Ht '5\' 6"'$  (1.676 m)   Wt 178 lb 12.8 oz (81.1 kg)   SpO2 91%   BMI 28.86 kg/m     Wt Readings from Last 3 Encounters:  03/22/22 178 lb 12.8 oz (81.1 kg)  03/15/22 173 lb (78.5 kg)  02/02/22 162 lb (73.5 kg)     GEN: Well nourished, well developed, appears short of breath after walking in room, leaning forward  in tripod position on exam table.  HEENT: Normal NECK: No JVD; No carotid bruits CARDIAC: S1/S2, irregular rhythm and regular rate, no murmurs, rubs, or gallops noted. 1+ left radial pulse, 2+ right radial pulse, 1+ PT pulses, equal bilaterally, AV fistula along left arm, positive for thrill and soft bruit noted.  RESPIRATORY:  Clear and diminished to auscultation without rales, wheezing or rhonchi  MUSCULOSKELETAL:  Edema along BLE (R>L); Amloidosis and swelling to right carpal tunnel, normal left carpal tunnel, No deformity  SKIN: Overall warm and dry NEUROLOGIC:  Alert and oriented x 3, stuttering and  delayed speech PSYCHIATRIC:  Flat affect, withdrawn  ASSESSMENT:    1. HFrEF (heart failure with reduced ejection fraction) ([place])   2. Atrial flutter, unspecified type ([place])   3. Atrial fibrillation, unspecified type ([place])   4. Coronary artery disease involving native coronary artery of native heart without angina pectoris   5. End stage renal disease ([place])   6. Hypertension, unspecified type   7. Hyperlipidemia LDL goal <70   8. Amyloidosis, unspecified type ([place])   9. History of stroke   10. OSA (obstructive sleep apnea)    PLAN:     Patient is a 76 year old African-American male with the following past medical history paroxysmal atrial fibrillation on Eliquis, history of prior pericardial effusion status post window, history of CAD, amyloid heart disease, heart failure with reduced ejection fraction, hypertension, hyperlipidemia, type 2 diabetes, past history of stroke, end-stage renal disease with newly added AV fistula on left arm, OSA who presents to the cardiology office today with chief complaint of worsening shortness of breath.  Was able to previously walk around the block at home before getting short of breath, now has shortness of breath with going from room to room at home.  Wife admits to neurological symptoms over the past 2 weeks including confusion, stuttering, slurred speech, similar symptoms to previous stroke many years ago.  Upon entering the room, patient is sitting in tripod position on exam table, notably short of breath by visual inspection.  No JVD noted but does have swelling in bilateral lower extremities.  Right leg is wrapped and patient states has chronic wound on right leg.  States he has chronic edema right greater than left.  Amyloid buildup noted in left carpal tunnel, no amyloid noted in right carpal tunnel.  EKG revealed A-flutter, 65 bpm.  Overall clear and diminished to auscultation.  Flat affect, withdrawn.  I discussed different management options including  close follow-up outpatient management versus inpatient management.  Case was discussed with patient's primary cardiologist, Dr. Marlou Porch, who agreed that patient should be admitted to the hospital given his shortness of breath at rest, new confusion, slurred/stuttered speech, and acute/ill appearing appearance.  EMS is transporting this patient as we speak.  Dr. Marlou Porch agree that this patient will be a hospitalist patient and cardiology will be consulted along with nephrology due to patient's history of end-stage renal disease and new AV fistula of left arm.  Plan of care was discussed with patient and wife who agreed and verbalized understanding.    Medication Adjustments/Labs and Tests Ordered: Current medicines are reviewed at length with the patient today.  Concerns regarding medicines are outlined above.  Orders Placed This Encounter  Procedures   EKG 12-Lead   No orders of the defined types were placed in this encounter.   Patient Instructions  You are being sent to the Hospital.  We hope you feel better VERY SOON :)  SignedFinis Bud, NP  03/22/2022 3:49 PM    [place]

## 2022-03-22 NOTE — ED Provider Triage Note (Signed)
Emergency Medicine Provider Triage Evaluation Note  Corey [name] , a 76 y.o. male  was evaluated in triage.  Pt complains of evaluated at [facility] today, wort fatigue and SOB. Wife reports patient with slurred speech x 2 weeks. EKG with Aflutter with prior hx of arrhythmia.Also noted some swelling to his left leg which began a couple of days ago.   Review of Systems  Positive: Sob, fatigue Negative: Fever, cough   Physical Exam  Ht '5\' 6"'$  (1.676 m)   Wt 80.7 kg   BMI 28.73 kg/m  Gen:   Awake, no distress   Resp:  Normal effort  MSK:   Moves extremities without difficulty  Other:    Medical Decision Making  Medically screening exam initiated at 4:01 PM.  Appropriate orders placed.  DECLAN MIER was informed that the remainder of the evaluation will be completed by another provider, this initial triage assessment does not replace that evaluation, and the importance of remaining in the ED until their evaluation is complete.     Janeece Fitting, PA-C 03/22/22 1601

## 2022-03-23 ENCOUNTER — Inpatient Hospital Stay (HOSPITAL_COMMUNITY): Payer: Medicare HMO

## 2022-03-23 DIAGNOSIS — E1122 Type 2 diabetes mellitus with diabetic chronic kidney disease: Secondary | ICD-10-CM | POA: Diagnosis present

## 2022-03-23 DIAGNOSIS — E872 Acidosis, unspecified: Secondary | ICD-10-CM | POA: Diagnosis present

## 2022-03-23 DIAGNOSIS — R29818 Other symptoms and signs involving the nervous system: Secondary | ICD-10-CM | POA: Diagnosis not present

## 2022-03-23 DIAGNOSIS — M109 Gout, unspecified: Secondary | ICD-10-CM | POA: Diagnosis present

## 2022-03-23 DIAGNOSIS — N250 Renal osteodystrophy: Secondary | ICD-10-CM | POA: Diagnosis not present

## 2022-03-23 DIAGNOSIS — Z8546 Personal history of malignant neoplasm of prostate: Secondary | ICD-10-CM | POA: Diagnosis not present

## 2022-03-23 DIAGNOSIS — N184 Chronic kidney disease, stage 4 (severe): Secondary | ICD-10-CM

## 2022-03-23 DIAGNOSIS — Z1152 Encounter for screening for COVID-19: Secondary | ICD-10-CM | POA: Diagnosis not present

## 2022-03-23 DIAGNOSIS — I5043 Acute on chronic combined systolic (congestive) and diastolic (congestive) heart failure: Secondary | ICD-10-CM | POA: Diagnosis present

## 2022-03-23 DIAGNOSIS — I43 Cardiomyopathy in diseases classified elsewhere: Secondary | ICD-10-CM | POA: Diagnosis present

## 2022-03-23 DIAGNOSIS — I5040 Unspecified combined systolic (congestive) and diastolic (congestive) heart failure: Secondary | ICD-10-CM | POA: Diagnosis not present

## 2022-03-23 DIAGNOSIS — E854 Organ-limited amyloidosis: Secondary | ICD-10-CM

## 2022-03-23 DIAGNOSIS — R9431 Abnormal electrocardiogram [ECG] [EKG]: Secondary | ICD-10-CM | POA: Diagnosis not present

## 2022-03-23 DIAGNOSIS — R4781 Slurred speech: Secondary | ICD-10-CM | POA: Diagnosis not present

## 2022-03-23 DIAGNOSIS — Z87891 Personal history of nicotine dependence: Secondary | ICD-10-CM | POA: Diagnosis not present

## 2022-03-23 DIAGNOSIS — E785 Hyperlipidemia, unspecified: Secondary | ICD-10-CM | POA: Diagnosis present

## 2022-03-23 DIAGNOSIS — I4892 Unspecified atrial flutter: Secondary | ICD-10-CM | POA: Diagnosis present

## 2022-03-23 DIAGNOSIS — Z8674 Personal history of sudden cardiac arrest: Secondary | ICD-10-CM | POA: Diagnosis not present

## 2022-03-23 DIAGNOSIS — D631 Anemia in chronic kidney disease: Secondary | ICD-10-CM | POA: Diagnosis present

## 2022-03-23 DIAGNOSIS — X58XXXA Exposure to other specified factors, initial encounter: Secondary | ICD-10-CM | POA: Diagnosis present

## 2022-03-23 DIAGNOSIS — I5021 Acute systolic (congestive) heart failure: Secondary | ICD-10-CM | POA: Diagnosis not present

## 2022-03-23 DIAGNOSIS — I4891 Unspecified atrial fibrillation: Secondary | ICD-10-CM | POA: Diagnosis not present

## 2022-03-23 DIAGNOSIS — R600 Localized edema: Secondary | ICD-10-CM

## 2022-03-23 DIAGNOSIS — R0602 Shortness of breath: Secondary | ICD-10-CM | POA: Diagnosis not present

## 2022-03-23 DIAGNOSIS — Z79899 Other long term (current) drug therapy: Secondary | ICD-10-CM | POA: Diagnosis not present

## 2022-03-23 DIAGNOSIS — I252 Old myocardial infarction: Secondary | ICD-10-CM | POA: Diagnosis not present

## 2022-03-23 DIAGNOSIS — Z8673 Personal history of transient ischemic attack (TIA), and cerebral infarction without residual deficits: Secondary | ICD-10-CM | POA: Diagnosis not present

## 2022-03-23 DIAGNOSIS — N179 Acute kidney failure, unspecified: Secondary | ICD-10-CM | POA: Diagnosis present

## 2022-03-23 DIAGNOSIS — I25119 Atherosclerotic heart disease of native coronary artery with unspecified angina pectoris: Secondary | ICD-10-CM | POA: Diagnosis present

## 2022-03-23 DIAGNOSIS — E869 Volume depletion, unspecified: Secondary | ICD-10-CM | POA: Diagnosis present

## 2022-03-23 DIAGNOSIS — I2510 Atherosclerotic heart disease of native coronary artery without angina pectoris: Secondary | ICD-10-CM | POA: Diagnosis not present

## 2022-03-23 DIAGNOSIS — I10 Essential (primary) hypertension: Secondary | ICD-10-CM | POA: Diagnosis not present

## 2022-03-23 DIAGNOSIS — I130 Hypertensive heart and chronic kidney disease with heart failure and stage 1 through stage 4 chronic kidney disease, or unspecified chronic kidney disease: Secondary | ICD-10-CM | POA: Diagnosis present

## 2022-03-23 DIAGNOSIS — I509 Heart failure, unspecified: Secondary | ICD-10-CM | POA: Diagnosis not present

## 2022-03-23 DIAGNOSIS — I480 Paroxysmal atrial fibrillation: Secondary | ICD-10-CM

## 2022-03-23 DIAGNOSIS — E1110 Type 2 diabetes mellitus with ketoacidosis without coma: Secondary | ICD-10-CM | POA: Diagnosis not present

## 2022-03-23 DIAGNOSIS — I2489 Other forms of acute ischemic heart disease: Secondary | ICD-10-CM | POA: Diagnosis present

## 2022-03-23 LAB — CBG MONITORING, ED
Glucose-Capillary: 125 mg/dL — ABNORMAL HIGH (ref 70–99)
Glucose-Capillary: 85 mg/dL (ref 70–99)

## 2022-03-23 LAB — HEMOGLOBIN A1C
Hgb A1c MFr Bld: 5.7 % — ABNORMAL HIGH (ref 4.8–5.6)
Mean Plasma Glucose: 116.89 mg/dL

## 2022-03-23 LAB — IRON AND TIBC
Iron: 52 ug/dL (ref 45–182)
Saturation Ratios: 19 % (ref 17.9–39.5)
TIBC: 272 ug/dL (ref 250–450)
UIBC: 220 ug/dL

## 2022-03-23 LAB — RETICULOCYTES
Immature Retic Fract: 5.2 % (ref 2.3–15.9)
RBC.: 3.88 MIL/uL — ABNORMAL LOW (ref 4.22–5.81)
Retic Count, Absolute: 25.6 10*3/uL (ref 19.0–186.0)
Retic Ct Pct: 0.7 % (ref 0.4–3.1)

## 2022-03-23 LAB — ECHOCARDIOGRAM COMPLETE
Area-P 1/2: 3.91 cm2
Calc EF: 42.5 %
Height: 66 in
S' Lateral: 3.6 cm
Single Plane A2C EF: 45.5 %
Single Plane A4C EF: 40.8 %
Weight: 2848 oz

## 2022-03-23 LAB — LIPID PANEL
Cholesterol: 86 mg/dL (ref 0–200)
HDL: 44 mg/dL (ref >40)
LDL Cholesterol: 31 mg/dL (ref 0–99)
Total CHOL/HDL Ratio: 2 RATIO
Triglycerides: 54 mg/dL (ref <150)
VLDL: 11 mg/dL (ref 0–40)

## 2022-03-23 LAB — TROPONIN I (HIGH SENSITIVITY)
Troponin I (High Sensitivity): 856 ng/L (ref <18)
Troponin I (High Sensitivity): 777 ng/L (ref <18)
Troponin I (High Sensitivity): 680 ng/L (ref <18)

## 2022-03-23 LAB — FERRITIN: Ferritin: 95 ng/mL (ref 24–336)

## 2022-03-23 LAB — FOLATE: Folate: 14.1 ng/mL (ref >5.9)

## 2022-03-23 LAB — VITAMIN B12: Vitamin B-12: 848 pg/mL (ref 180–914)

## 2022-03-23 LAB — GLUCOSE, CAPILLARY: Glucose-Capillary: 102 mg/dL — ABNORMAL HIGH (ref 70–99)

## 2022-03-23 MED ORDER — ROSUVASTATIN CALCIUM 20 MG PO TABS
40.0000 mg | ORAL_TABLET | Freq: Every day | ORAL | Status: DC
  Administered 2022-03-23 – 2022-03-29 (×7): 40 mg via ORAL
  Filled 2022-03-23 (×7): qty 2

## 2022-03-23 MED ORDER — INSULIN ASPART 100 UNIT/ML IJ SOLN
0.0000 [IU] | Freq: Three times a day (TID) | INTRAMUSCULAR | Status: DC
  Administered 2022-03-23: 1 [IU] via SUBCUTANEOUS
  Administered 2022-03-25 – 2022-03-27 (×4): 2 [IU] via SUBCUTANEOUS
  Administered 2022-03-27 – 2022-03-28 (×2): 1 [IU] via SUBCUTANEOUS
  Administered 2022-03-28: 2 [IU] via SUBCUTANEOUS
  Administered 2022-03-28 – 2022-03-29 (×2): 3 [IU] via SUBCUTANEOUS

## 2022-03-23 MED ORDER — ONDANSETRON HCL 4 MG/2ML IJ SOLN: 4.0000 mg | Freq: Four times a day (QID) | INTRAMUSCULAR | Status: DC | PRN

## 2022-03-23 MED ORDER — TAFAMIDIS 61 MG PO CAPS: 61.0000 mg | ORAL_CAPSULE | Freq: Every day | ORAL | Status: DC

## 2022-03-23 MED ORDER — HYDRALAZINE HCL 50 MG PO TABS
100.0000 mg | ORAL_TABLET | Freq: Three times a day (TID) | ORAL | Status: DC
  Administered 2022-03-23 – 2022-03-26 (×11): 100 mg via ORAL
  Filled 2022-03-23 (×6): qty 2
  Filled 2022-03-23: qty 4
  Filled 2022-03-23 (×5): qty 2

## 2022-03-23 MED ORDER — FUROSEMIDE 10 MG/ML IJ SOLN
80.0000 mg | Freq: Two times a day (BID) | INTRAMUSCULAR | Status: DC
  Administered 2022-03-23: 80 mg via INTRAVENOUS
  Filled 2022-03-23: qty 8

## 2022-03-23 MED ORDER — PERFLUTREN LIPID MICROSPHERE
1.0000 mL | INTRAVENOUS | Status: AC | PRN
  Administered 2022-03-23: 3 mL via INTRAVENOUS

## 2022-03-23 MED ORDER — FUROSEMIDE 10 MG/ML IJ SOLN
120.0000 mg | Freq: Two times a day (BID) | INTRAVENOUS | Status: DC
  Administered 2022-03-23 – 2022-03-24 (×2): 120 mg via INTRAVENOUS
  Filled 2022-03-23: qty 12
  Filled 2022-03-23 (×2): qty 10

## 2022-03-23 MED ORDER — METOLAZONE 5 MG PO TABS
5.0000 mg | ORAL_TABLET | Freq: Once | ORAL | Status: AC
  Administered 2022-03-23: 5 mg via ORAL
  Filled 2022-03-23: qty 1

## 2022-03-23 MED ORDER — FUROSEMIDE 10 MG/ML IJ SOLN
120.0000 mg | Freq: Once | INTRAVENOUS | Status: AC
  Administered 2022-03-23: 120 mg via INTRAVENOUS
  Filled 2022-03-23: qty 12
  Filled 2022-03-23: qty 10

## 2022-03-23 MED ORDER — ONDANSETRON HCL 4 MG PO TABS: 4.0000 mg | ORAL_TABLET | Freq: Four times a day (QID) | ORAL | Status: DC | PRN

## 2022-03-23 MED ORDER — ASPIRIN 81 MG PO CHEW
81.0000 mg | CHEWABLE_TABLET | Freq: Every day | ORAL | Status: DC
  Administered 2022-03-23 – 2022-03-29 (×7): 81 mg via ORAL
  Filled 2022-03-23 (×7): qty 1

## 2022-03-23 MED ORDER — ALLOPURINOL 100 MG PO TABS
50.0000 mg | ORAL_TABLET | Freq: Every day | ORAL | Status: DC
  Administered 2022-03-23 – 2022-03-29 (×7): 50 mg via ORAL
  Filled 2022-03-23 (×5): qty 1
  Filled 2022-03-23: qty 0.5
  Filled 2022-03-23 (×2): qty 1

## 2022-03-23 MED ORDER — APIXABAN 5 MG PO TABS
5.0000 mg | ORAL_TABLET | Freq: Two times a day (BID) | ORAL | Status: DC
  Administered 2022-03-23 – 2022-03-29 (×13): 5 mg via ORAL
  Filled 2022-03-23 (×13): qty 1

## 2022-03-23 MED ORDER — INSULIN ASPART 100 UNIT/ML IJ SOLN: 0.0000 [IU] | Freq: Every day | INTRAMUSCULAR | Status: DC

## 2022-03-23 MED ORDER — FUROSEMIDE 10 MG/ML IJ SOLN
40.0000 mg | Freq: Once | INTRAMUSCULAR | Status: AC
  Administered 2022-03-23: 40 mg via INTRAVENOUS
  Filled 2022-03-23: qty 4

## 2022-03-23 NOTE — Assessment & Plan Note (Signed)
Stable

## 2022-03-23 NOTE — Consult Note (Signed)
WOC Nurse Consult Note: Reason for Consult: NOnhealing wound to right lateral lower leg.  Getting collagen dressing and compression three times weekly. Will implement aquacel and Unna boots twice weekly while in house Wound type: venous, chronic Pressure Injury POA: YNA Measurement: 1 cm x 2 cm x 0.1 cm  with 2 cm x 2 cm intact pink scar Wound JJK:[id number] and moist Drainage (amount, consistency, odor) moderate serosanguinous  no odor Periwound: intact Dressing procedure/placement/frequency: Cleanse wound to right lower leg with NS and pat dry. Apply aquacel Ag to wound bed (Left at bedside) and apply Unna boot change twice weekly.  Will not follow at this time.  Please re-consult if needed.  Corey Ludwig MSN, RN, FNP-BC CWON Wound, Ostomy, Continence Nurse [facility] [phone] Pager [phone]

## 2022-03-23 NOTE — Assessment & Plan Note (Signed)
On Vyndamax. Followed by cardiology.

## 2022-03-23 NOTE — Consult Note (Signed)
Advanced Heart Failure Team Consult Note   Primary Physician: [name], MD PCP-Cardiologist:  Candee Furbish, MD  Reason for Consultation:  acute on chronic combined systolic and diastolic heart failure w/ cardiac amyloidosis  HPI:    Corey [name] is seen today for evaluation of acute on chronic combined systolic and diastolic heart failure w/ cardiac amyloidosis at the request of Dr. Broadus John, internal medicine.   Corey [name] is a 76 y.o.  AA male  with PMH of chronic pericardial effusion s/p pericardial window, amyloidosis, CAD, combined systolic and diastolic CHF, PEA arrest, HTN, HLD, DM type 2, CVA, OSA, cognitive impairment, paroxysmal a fib and prostate cancer. Presented to ED with worsening SOB and weight gain.   In the ED: BNP 1571, CXR: bilateral interstitial edema, BUN 70, creatinine 4.5 (baseline recently around 3), EKG atrial flutter, HsTrop 856>777. Diuresed with '120mg'$  IV lasix x1. UOP not accurately documented yet, several urinals with urine at bedside.   Per patient he has been taking all of his medications as prescribed. Was taking Torsemide '20mg'$  MWF with occasional PRN use. Was seen in cardiology clinic yesterday and decision to come to ER was made. Recently 8/23 he was found to be in new atrial fibrillation with slow rates, BB stopped and placed on Eliquis. He had been experiencing ~1 month of worsening SOB and rapid weight gain. Still able to ambulate around the house and do ADLs but would get SOB quicker. Denies smoking and ETOH use. Drinks 3-4 16oz water bottles/day +/- coffee and diet mnt dew. Appetite is good. Unable to sleep at night, gets awakened gasping for air some nights.  On assessment patient in no distress in stretcher. Denies CP, no SOB at rest.   Cardiac studies reviewed 13' echo: normal EF, severe LVH,G2DD, suggestive for cardiac amyloidosis  13' cMRI: less suggestive of amyloid and more consistent with non obstructive HOCM  47' echo: 55-60%, severe  LVH, mod pericardial effusion 19' echo: EF 55-60%, G3DD, moderate pericardial effusion  5/22: L/RHC: single vessel CAD 80% LAD occlusion with severely elevated LVEDP  5/22: ECHO with EF 35-40%, G2DD, RWMA, Large, pericardial effusion  5/22: Underwent pericardial window procedure for his pericardial effusion and biopsies were taken which were consistent with amyloidosis, suggestive of possible AL amyloidosis  8/22: PYP: Findings are equivocal for TTR amyloidosis   Home Medications Prior to Admission medications   Medication Sig Start Date End Date Taking? Authorizing Provider  albuterol (VENTOLIN HFA) 108 (90 Base) MCG/ACT inhaler Inhale 2 puffs into the lungs every 4 (four) hours as needed for wheezing or shortness of breath. 11/14/21  Yes White, Adrienne R, NP  allopurinol (ZYLOPRIM) 100 MG tablet TAKE 1/2 TABLET ONE TIME DAILY Patient taking differently: Take 100 mg by mouth daily as needed (for gout flare up). 11/01/21  Yes Burns, [name], MD  apixaban (ELIQUIS) 5 MG TABS tablet Take 1 tablet (5 mg total) by mouth 2 (two) times daily. 01/12/22  Yes Jerline Pain, MD  bismuth subsalicylate (PEPTO BISMOL) 262 MG/15ML suspension Take 30 mLs by mouth every 6 (six) hours as needed for indigestion.   Yes [provider]  colchicine (COLCRYS) 0.6 MG tablet Take 2 tabs once and then one tab one hour later as needed for gout 07/30/20  Yes Burns, [name], MD  ergocalciferol (VITAMIN D2) 1.25 MG (50000 UT) capsule Take 50,000 Units by mouth once a week. Monday   Yes [provider]  hydrALAZINE (APRESOLINE) 50 MG  tablet Take 2 tablets (100 mg total) by mouth 3 (three) times daily. 11/28/21  Yes Jerline Pain, MD  Tafamidis (VYNDAMAX) 61 MG CAPS Take 61 mg by mouth daily. 04/28/21  Yes O'Neal, Cassie Freer, MD  torsemide [facility]) 20 MG tablet Patient take 20 mg tablet by mouth every Monday, Wednesday, and Friday May take 1 additional tablet for swelling in legs   Yes [provider]  ACCU-CHEK AVIVA PLUS test strip TEST BLOOD SUGAR TWICE DAILY AS DIRECTED 12/05/20   Elayne Snare, MD  ACCU-CHEK AVIVA PLUS test strip Use as instructed 12/04/20   Elayne Snare, MD  JANUVIA 25 MG tablet Take 1 tablet by mouth once daily Patient not taking: Reported on 03/23/2022 02/02/22   Elayne Snare, MD  nitroGLYCERIN (NITROSTAT) 0.4 MG SL tablet Place 0.4 mg under the tongue every 5 (five) minutes as needed for chest pain. Patient not taking: Reported on 03/23/2022 09/30/20 03/22/22  [provider]  TRUEplus Lancets 33G MISC USE 4 (FOUR) TIMES DAILY 04/04/21   Elayne Snare, MD    Past Medical History: Past Medical History:  Diagnosis Date   Cataract    CHF (congestive heart failure) ([place])    Chronic heart failure with preserved ejection fraction (HFpEF) ([place])    Colon polyp 2003   Dr [name]; F/U was to be 2008( not completed)   Coronary artery disease    CVA (cerebral infarction) 2014   Diabetes mellitus    Diverticulosis 2003   Dyspnea    with exertion   Gout    Hereditary cardiac amyloidosis (HCC)    Hypertension    Myocardial infarction ([place]) 09/19/2012   PEA cardiac arrest in setting of acute respiratory failure/pulmonary edema   OSA (obstructive sleep apnea) 03/27/2018   Pneumonia 09/29/2011   Avelox X 10 days as OP   Prostate cancer ([place])    Renal insufficiency    Seizures ([place]) 09/19/2012   not treated for seizure disorder; had a seizure after stroke 2014; no seizure since then   Stroke [facility])     Past Surgical History: Past Surgical History:  Procedure Laterality Date   AV FISTULA PLACEMENT Left 01/31/2022   Procedure: LEFT ARM ARTERIOVENOUS (AV) FISTULA CREATION;  Surgeon: Waynetta Sandy, MD;  Location: [place];  Service: Vascular;  Laterality: Left;  regional block to left arm   COLONOSCOPY W/ POLYPECTOMY  2003   no F/U ([place] discussed 12/03/12)   INGUINAL HERNIA REPAIR Left 06/16/2021   Procedure: LAPAROSCOPIC, POSSIBLY OPEN LEFT INGUINAL  HERNIA REPAIR WITH MESH;  Surgeon: Felicie Morn, MD;  Location: [facility];  Service: General;  Laterality: Left;   INSERTION OF MESH N/A 07/16/2017   Procedure: INSERTION OF MESH;  Surgeon: [name], MD;  Location: [place] OR;  Service: General;  Laterality: N/A;   PEG PLACEMENT  2014   10/08/12-12/10/12   PEG TUBE REMOVAL  2014   PROSTATE BIOPSY     RIGHT/LEFT HEART CATH AND CORONARY ANGIOGRAPHY N/A 09/20/2020   Procedure: RIGHT/LEFT HEART CATH AND CORONARY ANGIOGRAPHY;  Surgeon: Leonie Man, MD;  Location: [place] CV LAB;  Service: Cardiovascular;  Laterality: N/A;   TEE WITHOUT CARDIOVERSION N/A 09/27/2020   Procedure: TRANSESOPHAGEAL ECHOCARDIOGRAM (TEE);  Surgeon: Wonda Olds, MD;  [facility];  Service: Thoracic;  Laterality: N/A;   TRACHEOSTOMY  2014   09/30/12-10/20/12   VENTRAL HERNIA REPAIR N/A 07/16/2017   Procedure: LAPAROSCOPIC VENTRAL HERNIA REPAIR WITH MESH;  Surgeon: [name],  MD;  Location: [place];  Service: General;  Laterality: N/A;   wrist aspiration  02/16/2012    monosodium urate crystals; Dr Caralyn Guile    Family History: Family History  Problem Relation Age of Onset   Heart failure Mother    Hypertension Mother    Diabetes Mother    Hypertension Father    Prostate cancer Father    Diabetes Father    Cancer Father    Heart failure Brother    Stroke Neg Hx    Heart disease Neg Hx    Colon cancer Neg Hx     Social History: Social History   Socioeconomic History   Marital status: Married    Spouse name: [name]   Number of children: 2   Years of education: 12   Highest education level: 12th grade  Occupational History   Not on file  Tobacco Use   Smoking status: Former    Packs/day: 0.25    Years: 1.00    Total pack years: 0.25    Types: Cigarettes    Quit date: 05/15/1968    Years since quitting: 53.8   Smokeless tobacco: Never   Tobacco comments:    smoked [place], up to 1-2 cigarettes/ day  Vaping Use    Vaping Use: Never used  Substance and Sexual Activity   Alcohol use: No    Alcohol/week: 0.0 standard drinks of alcohol   Drug use: No   Sexual activity: Yes  Other Topics Concern   Not on file  Social History Narrative   Walks three times a week, 2-3 miles, occasional walks on treadmill       Patient is right-handed. He lives with his wife in a one level home. He drinks 1 diet Mt. Dew a day and rarely has coffee. He walks 3 x a week for exercise.   Social Determinants of Health   Financial Resource Strain: Low Risk  (02/02/2022)   Overall Financial Resource Strain (CARDIA)    Difficulty of Paying Living Expenses: Not hard at all  Food Insecurity: No Food Insecurity (02/02/2022)   Hunger Vital Sign    Worried About Running Out of Food in the Last Year: Never true    Ran Out of Food in the Last Year: Never true  Transportation Needs: No Transportation Needs (02/02/2022)   PRAPARE - Hydrologist (Medical): No    Lack of Transportation (Non-Medical): No  Physical Activity: Inactive (02/02/2022)   Exercise Vital Sign    Days of Exercise per Week: 0 days    Minutes of Exercise per Session: 0 min  Stress: No Stress Concern Present (02/02/2022)   [place]    Feeling of Stress : Not at all  Social Connections: [place] (02/02/2022)   Social Connection and Isolation Panel [NHANES]    Frequency of Communication with Friends and Family: More than three times a week    Frequency of Social Gatherings with Friends and Family: More than three times a week    Attends Religious Services: More than 4 times per year    Active Member of [employer] or Organizations: Yes    Attends Music therapist: More than 4 times per year    Marital Status: Married    Allergies:  Allergies  Allergen Reactions   Hydrochlorothiazide Other (See Comments)    Gout , uncontrolled diabetes and renal  insufficiency    Objective:    Vital  Signs:   Temp:  [97.4 F (36.3 C)-98 F (36.7 C)] 97.6 F (36.4 C) (11/09 0650) Pulse Rate:  [45-116] 48 (11/09 0833) Resp:  [17-22] 17 (11/09 0833) BP: (131-154)/(73-95) 154/95 (11/09 0833) SpO2:  [94 %-100 %] 94 % (11/09 0833) Weight:  [80.7 kg] 80.7 kg (11/08 1556) Last BM Date : 03/22/22  Weight change: Filed Weights   03/22/22 1556  Weight: 80.7 kg    Intake/Output:   Intake/Output Summary (Last 24 hours) at 03/23/2022 1108 Last data filed at 03/23/2022 0745 Gross per 24 hour  Intake --  Output 700 ml  Net -700 ml      Physical Exam  General:  well appearing.  No respiratory difficulty at rest HEENT: normal Neck: supple. JVD to ear. Carotids 2+ bilat; no bruits. No lymphadenopathy or thyromegaly appreciated. Cor: PMI nondisplaced. Irregular rhythm, slow rate . No rubs, gallops or murmurs. Lungs: clear, diminished bases L>R Abdomen: soft, nontender, nondistended. No hepatosplenomegaly. No bruits or masses. Good bowel sounds. Extremities: no cyanosis, clubbing, rash. Chronic wounds BLE. +1-2 BLE edema to thigh. Swollen L wrist with mass? AV fistula LUE Neuro: alert & oriented x 3, cranial nerves grossly intact. moves all 4 extremities w/o difficulty. Affect pleasant.   Telemetry   Atrial flutter 50s (Personally reviewed)    EKG    Atrial flutter with variable AV block, left anterior fascicular block, possible anterior infarct 51 bpm  Labs   Basic Metabolic Panel: Recent Labs  Lab 03/22/22 1659  NA 143  K 3.8  CL 113*  CO2 19*  GLUCOSE 78  BUN 70*  CREATININE 4.58*  CALCIUM 9.6    Liver Function Tests: No results for input(s): "AST", "ALT", "ALKPHOS", "BILITOT", "PROT", "ALBUMIN" in the last 168 hours. No results for input(s): "LIPASE", "AMYLASE" in the last 168 hours. No results for input(s): "AMMONIA" in the last 168 hours.  CBC: Recent Labs  Lab 03/22/22 1659  WBC 2.7*  NEUTROABS 1.6*  HGB 11.6*   HCT 36.4*  MCV 87.7  PLT 103*    Cardiac Enzymes: No results for input(s): "CKTOTAL", "CKMB", "CKMBINDEX", "TROPONINI" in the last 168 hours.  BNP: BNP (last 3 results) Recent Labs    03/22/22 1659  BNP 1,571.3*    ProBNP (last 3 results) Recent Labs    01/05/22 1125  PROBNP 1,740.0*     CBG: Recent Labs  Lab 03/23/22 0911  GLUCAP 125*    Coagulation Studies: No results for input(s): "LABPROT", "INR" in the last 72 hours.   Imaging   DG Chest 2 View  Result Date: 03/22/2022 CLINICAL DATA:  Shortness of breath EXAM: CHEST - 2 VIEW COMPARISON:  01/05/2022 FINDINGS: Gross cardiomegaly. Diffuse bilateral interstitial pulmonary opacity. The visualized skeletal structures are unremarkable. IMPRESSION: Gross cardiomegaly with diffuse bilateral interstitial pulmonary opacity, likely edema. No focal airspace opacity. Electronically Signed   By: Delanna Ahmadi M.D.   On: 03/22/2022 16:56     Medications:     Current Medications:  allopurinol  50 mg Oral Daily   apixaban  5 mg Oral BID   insulin aspart  0-9 Units Subcutaneous TID WC   Tafamidis  61 mg Oral Daily    Infusions:  furosemide 120 mg (03/23/22 1019)      Patient Profile   Corey [name] is a 76 y.o.  AA male  with PMH of chronic pericardial effusion s/p pericardial window, amyloidosis, CAD, combined systolic and diastolic CHF, PEA arrest, HTN, HLD, DM type 2, CVA, OSA, cognitive  impairment, paroxysmal a fib and prostate cancer. AHF team asked to see for acute on chronic combined systolic and diastolic heart failure w/ cardiac amyloidosis  Assessment/Plan   Chronic systolic heart failure/cardiac amyloidosis  -Previously had pericardial effusion. Concerns for cardiac amyloidosis. Given his pericardial effusion he underwent pericardial window with myocardial biopsy. Biopsy came back positive for amyloidosis, biopsy suggested AL. Serum and urine studies however were not consistent with AL amyloidosis.  Underwent PYP scan 8/22 that was equivocal, had genetic testing that was positive for ATTR amyloid.  - 5/22: echo: EF 35-40%, G2DD, RWMA, Large pericardial effusion  - NYHA class III on admission, significantly volume overloaded.  - Continue 120 mg IV lasix BID, +5 mg metolazone today - Echo pending  - Continue tafamidis  - Continue hydral 100 TID, previously on Imdur 60 daily, and Jardiance 10 (will check with nephrology to see if we can restart) - check anemia panel - No UNNA boots with chronic wounds - Strict I&O, daily weights   CAD  -Previously admitted with concerns for unstable angina.  Found to have a 80% mid LAD stenosis. However was also found to have very elevated filling pressure consistent with CHF. The plan for intervention was aborted. He was diuresed and symptoms improved.  - HsTrop [id number], suspect demand ischemia - Continue rosuvastatin, ASA recently stopped 2/2 Eliquis need, restart ASA w/ CAD - LDL 34 in 22', check lipid panel - denies any current s/s of angina  Paroxsymal atrial fibrillation/ atrial flutter - Found to be in a fib 8/23 - Plan for TEE/DCCV tomorrow, NPO at midnight  - Hesitant to use amio with low HR - Continue Eliquis    Pericardial effusion s/p pericardial window  -Previously underwent pericardial window due to large pericardial effusion.  -Echo prior to discharge demonstrated no residual effusion  -Echo pending    CKD stage IV - recently had AV fistula placed - previously on 20 mg torsemide MWF with PRN doses - Now with cardiorenal syndrome. Cr up to 4.58, has progressively been increasing throughout this year. Around 2.5 when last seen in clinic 6/22. - nephrology following    Bradycardia with first-degree block  -Now off BB   Length of Stay: [place], AGACNP-BC  03/23/2022, 11:08 AM  Advanced Heart Failure Team Pager [phone] (M-F; 7a - 5p)  Please contact [facility] for night-coverage after hours (4p -7a ) and weekends  on [email or web address]

## 2022-03-23 NOTE — Assessment & Plan Note (Signed)
Currently in atrial flutter. Rate controlled. Continue with Eliquis.

## 2022-03-23 NOTE — Consult Note (Addendum)
[facility] Nephrology Consultation Note  Requesting MD: Dr. Domenic Polite Reason for consult: CKD IV/volume management  HPI:  Corey [name] is a 76 y.o. male with past medical history of hypertension, HLD, type 2 diabetes, stroke, A-fib, prostate CA, amyloidosis, combined systolic and diastolic CHF, PEA arrest, chronic pericardial effusion status post pericardial window, CKD stage IV followed by Dr. Royce Macadamia at [facility] who presents with gradual worsening shortness of breath and dyspnea on exertion, seen as a consultation for the evaluation CKD and fluid overload. For CKD, she follows with Dr. Royce Macadamia with recent baseline serum creatinine level seems to be around 3.3-3.9.  He is on torsemide 20 mg 3 times a week at home.  He underwent left brachial artery to cephalic vein AV fistula creation by Dr. Donzetta Matters on 01/31/2022. In the ED, blood pressure was acceptable and he remains mostly in room air.  The labs showed potassium 3.8, BUN 70, creatinine level 4.58, CO2 19, BNP 1571, troponin 856, hemoglobin 11.6.  The chest x-ray consistent with diffuse bilateral pulmonary edema. The patient is being admitted for CHF exacerbation.  The last echo showed a EF of 35 to 40% with grade 2 diastolic dysfunction.  He received Lasix 20 mg IV and a dose of metolazone with urine output 1800 documented.  The patient said his shortness of breath is stable but worsen with exertion.  Denies chest pain.  No nausea, vomiting, dysgeusia. He is currently getting echocardiogram at the bedside in ER. PMHx:   Past Medical History:  Diagnosis Date   Cataract    CHF (congestive heart failure) ([place])    Chronic heart failure with preserved ejection fraction (HFpEF) (HCC)    Colon polyp 2003   Dr [name]; F/U was to be 2008( not completed)   Coronary artery disease    CVA (cerebral infarction) 2014   Diabetes mellitus    Diverticulosis 2003   Dyspnea    with exertion   Gout    Hereditary cardiac amyloidosis  (HCC)    Hypertension    Myocardial infarction ([place]) 09/19/2012   PEA cardiac arrest in setting of acute respiratory failure/pulmonary edema   OSA (obstructive sleep apnea) 03/27/2018   Pneumonia 09/29/2011   Avelox X 10 days as OP   Prostate cancer ([place])    Renal insufficiency    Seizures ([place]) 09/19/2012   not treated for seizure disorder; had a seizure after stroke 2014; no seizure since then   Stroke [facility])     Past Surgical History:  Procedure Laterality Date   AV FISTULA PLACEMENT Left 01/31/2022   Procedure: LEFT ARM ARTERIOVENOUS (AV) FISTULA CREATION;  Surgeon: Waynetta Sandy, MD;  Location: [facility];  Service: Vascular;  Laterality: Left;  regional block to left arm   COLONOSCOPY W/ POLYPECTOMY  2003   no F/U ([place] discussed 12/03/12)   INGUINAL HERNIA REPAIR Left 06/16/2021   Procedure: LAPAROSCOPIC, POSSIBLY OPEN LEFT INGUINAL HERNIA REPAIR WITH MESH;  Surgeon: Felicie Morn, MD;  Location: [facility];  Service: General;  Laterality: Left;   INSERTION OF MESH N/A 07/16/2017   Procedure: INSERTION OF MESH;  Surgeon: [name], MD;  Location: [facility] OR;  Service: General;  Laterality: N/A;   PEG PLACEMENT  2014   10/08/12-12/10/12   PEG TUBE REMOVAL  2014   PROSTATE BIOPSY     RIGHT/LEFT HEART CATH AND CORONARY ANGIOGRAPHY N/A 09/20/2020   Procedure: RIGHT/LEFT HEART CATH AND CORONARY ANGIOGRAPHY;  Surgeon: Leonie Man, MD;  Location:  [facility];  Service: Cardiovascular;  Laterality: N/A;   TEE WITHOUT CARDIOVERSION N/A 09/27/2020   Procedure: TRANSESOPHAGEAL ECHOCARDIOGRAM (TEE);  Surgeon: Wonda Olds, MD;  Location: [facility] OR;  Service: Thoracic;  Laterality: N/A;   TRACHEOSTOMY  2014   09/30/12-10/20/12   VENTRAL HERNIA REPAIR N/A 07/16/2017   Procedure: LAPAROSCOPIC VENTRAL HERNIA REPAIR WITH MESH;  Surgeon: [name], MD;  Location: [facility];  Service: General;  Laterality: N/A;   wrist aspiration  02/16/2012    monosodium urate crystals;  Dr Caralyn Guile    Family Hx:  Family History  Problem Relation Age of Onset   Heart failure Mother    Hypertension Mother    Diabetes Mother    Hypertension Father    Prostate cancer Father    Diabetes Father    Cancer Father    Heart failure Brother    Stroke Neg Hx    Heart disease Neg Hx    Colon cancer Neg Hx     Social History:  reports that he quit smoking about 53 years ago. His smoking use included cigarettes. He has a 0.25 pack-year smoking history. He has never used smokeless tobacco. He reports that he does not drink alcohol and does not use drugs.  Allergies:  Allergies  Allergen Reactions   Hydrochlorothiazide Other (See Comments)    Gout , uncontrolled diabetes and renal insufficiency    Medications: Prior to Admission medications   Medication Sig Start Date End Date Taking? Authorizing Provider  albuterol (VENTOLIN HFA) 108 (90 Base) MCG/ACT inhaler Inhale 2 puffs into the lungs every 4 (four) hours as needed for wheezing or shortness of breath. 11/14/21  Yes White, Adrienne R, NP  allopurinol (ZYLOPRIM) 100 MG tablet TAKE 1/2 TABLET ONE TIME DAILY Patient taking differently: Take 100 mg by mouth daily as needed (for gout flare up). 11/01/21  Yes Burns, [name], MD  apixaban (ELIQUIS) 5 MG TABS tablet Take 1 tablet (5 mg total) by mouth 2 (two) times daily. 01/12/22  Yes Jerline Pain, MD  bismuth subsalicylate (PEPTO BISMOL) 262 MG/15ML suspension Take 30 mLs by mouth every 6 (six) hours as needed for indigestion.   Yes [provider]  colchicine (COLCRYS) 0.6 MG tablet Take 2 tabs once and then one tab one hour later as needed for gout 07/30/20  Yes Burns, [name], MD  ergocalciferol (VITAMIN D2) 1.25 MG (50000 UT) capsule Take 50,000 Units by mouth once a week. Monday   Yes [provider]  hydrALAZINE (APRESOLINE) 50 MG tablet Take 2 tablets (100 mg total) by mouth 3 (three) times daily. 11/28/21  Yes Jerline Pain, MD  Tafamidis (VYNDAMAX) 61 MG  CAPS Take 61 mg by mouth daily. 04/28/21  Yes O'Neal, Cassie Freer, MD  torsemide [facility]) 20 MG tablet Patient take 20 mg tablet by mouth every Monday, Wednesday, and Friday May take 1 additional tablet for swelling in legs   Yes [provider]  ACCU-CHEK AVIVA PLUS test strip TEST BLOOD SUGAR TWICE DAILY AS DIRECTED 12/05/20   Elayne Snare, MD  ACCU-CHEK AVIVA PLUS test strip Use as instructed 12/04/20   Elayne Snare, MD  JANUVIA 25 MG tablet Take 1 tablet by mouth once daily Patient not taking: Reported on 03/23/2022 02/02/22   Elayne Snare, MD  nitroGLYCERIN (NITROSTAT) 0.4 MG SL tablet Place 0.4 mg under the tongue every 5 (five) minutes as needed for chest pain. Patient not taking: Reported on 03/23/2022 09/30/20 03/22/22  [provider]  TRUEplus Lancets 33G MISC USE 4 (FOUR) TIMES DAILY 04/04/21   Elayne Snare, MD    I have reviewed the patient's current medications.  Labs: Renal Panel: Recent Labs    05/06/21 1004 06/10/21 1134 08/09/21 1000 09/29/21 1601 10/11/21 1031 01/05/22 1125 01/31/22 0750 03/22/22 1659  NA 143 142 143 143 142 140 141 143  K 4.4 4.2 3.5 4.0 4.3 4.3 3.8 3.8  CL 108 108 112 108* 106 107 112* 113*  CO2 '30 27 21 '$ 19* 22 23  --  19*  GLUCOSE 83 108* 66* 89 103* 95 89 78  BUN 42* 42* 60* 66* 68* 56* 52* 70*  CREATININE 2.51* 2.19* 3.26* 3.41* 3.35* 3.75* 3.10* 4.58*  CALCIUM 9.7 9.8 9.3 9.8 9.8 9.8  --  9.6  ALBUMIN  --   --   --   --   --  3.8  --   --      CBC:    Latest Ref Rng & Units 03/22/2022    4:59 PM 01/31/2022    7:50 AM 01/05/2022   11:25 AM  CBC  WBC 4.0 - 10.5 K/uL 2.7   4.2   Hemoglobin 13.0 - 17.0 g/dL 11.6  11.2  10.6   Hematocrit 39.0 - 52.0 % 36.4  33.0  33.1   Platelets 150 - 400 K/uL 103   128.0      Anemia Panel:  Recent Labs    06/10/21 1134 01/05/22 1125 01/31/22 0750 03/22/22 1659  HGB 13.4 10.6* 11.2* 11.6*  MCV 85.2 86.1  --  87.7    No results for input(s): "AST", "ALT", "ALKPHOS", "BILITOT",  "PROT", "ALBUMIN" in the last 168 hours.  Lab Results  Component Value Date   HGBA1C 5.7 (H) 03/23/2022    ROS:  Pertinent items noted in HPI and remainder of comprehensive ROS otherwise negative.  Physical Exam: Vitals:   03/23/22 1130 03/23/22 1215  BP: 133/69 130/76  Pulse: (!) 48 (!) 48  Resp: 20 (!) 21  Temp:    SpO2: 100% 100%     General exam: Appears calm and comfortable  Respiratory system: Basal crackles, no wheezing, Cardiovascular system: S1 & S2 heard, RRR.  Left leg edema looks chronic.. Gastrointestinal system: Abdomen is nondistended, soft and nontender. Normal bowel sounds heard. Central nervous system: Alert and oriented. No focal neurological deficits. Extremities: Symmetric 5 x 5 power.  Edema in the left leg. Skin: No rashes, lesions or ulcers Psychiatry: Judgement and insight appear normal. Mood & affect appropriate.  Vascular dialysis access: Left upper extremity AV fistula has good thrill and bruit.  Looks matured.  Assessment/Plan:  #Acute on chronic CKD stage IV with fluid overload: CKD thought to be due to diabetes and hypertension with recent baseline serum creatinine level around 3.3-3.9.  Received IV Lasix in ER with urine output documented around 1.8 L.  He is currently on room air.  AV fistula is mature and ready to use when needed.  We will continue IV diuretics today and monitor renal function.  I discussed with the patient that depending on the lab results and clinical response, we may have to start hemodialysis during this admission.  Patient agreed with the plan.  No urgent need for HD today.  #Vascular dialysis access: Left upper extremity AV fistula is matured and ready to use when needed.  #Acute on chronic combined CHF: Getting echocardiogram.  Diuresing with IV Lasix and metolazone.  Cardiology is following.  #Metabolic acidosis: I  will start him on oral sodium bicarbonate.  #Anemia of CKD: Hemoglobin at goal.  Continue to  monitor.  #CKD-MBD: Calcium acceptable, check phosphorus level.  Thank you for the consult, we will follow with you.   Berenise Hunton Tanna Furry 03/23/2022, 1:37 PM  [facility].

## 2022-03-23 NOTE — Subjective & Objective (Addendum)
CC: SOB, sent from cardiology office. 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  He was seen on 01/20/2022 due to worsening shortness of breath.  Patient's been having progressive dyspnea since May.  He states that he is getting short of breath with even minimal walking from room to room.  He states that his weight was 176 pounds.  He states that last year he weighed about 160.  He denies missing any of his torsemide doses.  Patient states that he has not received any heart failure education before.  He did not know that he was supposed to be on a fluid restriction.  He states he is drinking about 48 ounces of water a day along with 8 ounces of milk and about 12 ounces of diet soda a day.  This is about 70 ounces of fluid a day.  He was seen in cardiology clinic and noted to be in respiratory extremis.  He was sent to the ER for evaluation.  On arrival temp 97.7 heart rate 50 blood pressure 131/76 satting 100% on room air.  Chest x-ray showed mild bilateral interstitial edema  BNP was elevated at 1571  BUN of 70, creatinine 4.58  White count of 2.7, hemoglobin 11.6, platelets of 103  EKG showed atrial flutter.  [facility] hospitalist contacted for admission.

## 2022-03-23 NOTE — Progress Notes (Signed)
Heart Failure Navigator Progress Note  Assessed for Heart & Vascular TOC clinic readiness.  Patient does not meet criteria due to Advanced Heart Failure Team consult.Earnestine Leys, BSN, RN Heart Failure Transport planner Only

## 2022-03-23 NOTE — Progress Notes (Signed)
Orthopedic Tech Progress Note Patient Details:  Corey [name] 06-07-1945 [id number]   Whenever wound care is completed by nursing please call ortho techs so we can apply the unna boots. This was also communicated via phone call to the [employer] who shared it with the RN for this room.  Patient ID: Corey [name], male   DOB: 1946/05/05, 76 y.o.   MRN: [id number]  [name] 03/23/2022, 6:57 PM

## 2022-03-23 NOTE — Progress Notes (Signed)
Orthopedic Tech Progress Note Patient Details:  Corey [name] 1946-05-03 [id number]  Ortho Devices Type of Ortho Device: Louretta Parma boot Ortho Device/Splint Location: BIL Ortho Device/Splint Interventions: Ordered, Application, Adjustment      Edwina Barth 03/23/2022, 7:38 PM

## 2022-03-23 NOTE — Assessment & Plan Note (Signed)
Pt has left UE AVG that was placed in 01/2022. May need nephrology consult to initiate HD if IV lasix ineffective in stimulating diuresis or if Scr dramatically worsens with IV diuresis.

## 2022-03-23 NOTE — H&P (Signed)
History and Physical    Corey [name] YKD:[id number] DOB: Aug 18, 1945 DOA: 03/22/2022  DOS: the patient was seen and examined on 03/22/2022  PCP: [name], MD   Patient coming from: Clinic  I have personally briefly reviewed patient's old medical records in [place]  CC: SOB, sent from cardiology office. 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  He was seen on 01/20/2022 due to worsening shortness of breath.  Patient's been having progressive dyspnea since May.  He states that he is getting short of breath with even minimal walking from room to room.  He states that his weight was 176 pounds.  He states that last year he weighed about 160.  He denies missing any of his torsemide doses.  Patient states that he has not received any heart failure education before.  He did not know that he was supposed to be on a fluid restriction.  He states he is drinking about 48 ounces of water a day along with 8 ounces of milk and about 12 ounces of diet soda a day.  This is about 70 ounces of fluid a day.  He was seen in cardiology clinic and noted to be in respiratory extremis.  He was sent to the ER for evaluation.  On arrival temp 97.7 heart rate 50 blood pressure 131/76 satting 100% on room air.  Chest x-ray showed mild bilateral interstitial edema  BNP was elevated at 1571  BUN of 70, creatinine 4.58  White count of 2.7, hemoglobin 11.6, platelets of 103  EKG showed atrial flutter.  [facility] hospitalist contacted for admission.   ED Course: BNP 1517, CXR shows pulmonary edema. EKG shows atrial flutter.  Review of Systems:  Review of Systems  Constitutional:  Positive for  malaise/fatigue.  HENT: Negative.    Eyes: Negative.   Respiratory:  Positive for shortness of breath.   Cardiovascular:  Positive for orthopnea, leg swelling and PND. Negative for chest pain.  Gastrointestinal: Negative.   Genitourinary: Negative.   Skin:        Chronic wound on right leg.  Neurological: Negative.   Endo/Heme/Allergies: Negative.   Psychiatric/Behavioral: Negative.    All other systems reviewed and are negative.   Past Medical History:  Diagnosis Date   Cataract    CHF (congestive heart failure) (HCC)    Chronic heart failure with preserved ejection fraction (HFpEF) (HCC)    Colon polyp 2003   Dr [name]; F/U was to be 2008( not completed)   Coronary artery disease    CVA (cerebral infarction) 2014   Diabetes mellitus    Diverticulosis 2003   Dyspnea    with exertion   Gout    Hereditary cardiac amyloidosis (HCC)    Hypertension    Myocardial infarction ([place]) 09/19/2012   PEA cardiac arrest in setting of acute respiratory failure/pulmonary edema   OSA (obstructive sleep apnea) 03/27/2018   Pneumonia 09/29/2011   Avelox X 10 days as OP   Prostate cancer ([place])    Renal insufficiency    Seizures ([place]) 09/19/2012   not treated for seizure disorder; had a seizure after stroke 2014; no seizure since then   Stroke [facility])     Past  Surgical History:  Procedure Laterality Date   AV FISTULA PLACEMENT Left 01/31/2022   Procedure: LEFT ARM ARTERIOVENOUS (AV) FISTULA CREATION;  Surgeon: Waynetta Sandy, MD;  Location: [place];  Service: Vascular;  Laterality: Left;  regional block to left arm   COLONOSCOPY W/ POLYPECTOMY  2003   no F/U ([place] discussed 12/03/12)   INGUINAL HERNIA REPAIR Left 06/16/2021   Procedure: LAPAROSCOPIC, POSSIBLY OPEN LEFT INGUINAL HERNIA REPAIR WITH MESH;  Surgeon: Felicie Morn, MD;  Location: [facility];  Service: General;  Laterality: Left;   INSERTION OF MESH N/A 07/16/2017   Procedure: INSERTION OF MESH;  Surgeon:  [name], MD;  Location: [place];  Service: General;  Laterality: N/A;   PEG PLACEMENT  2014   10/08/12-12/10/12   PEG TUBE REMOVAL  2014   PROSTATE BIOPSY     RIGHT/LEFT HEART CATH AND CORONARY ANGIOGRAPHY N/A 09/20/2020   Procedure: RIGHT/LEFT HEART CATH AND CORONARY ANGIOGRAPHY;  Surgeon: Leonie Man, MD;  Location: [place] CV LAB;  Service: Cardiovascular;  Laterality: N/A;   TEE WITHOUT CARDIOVERSION N/A 09/27/2020   Procedure: TRANSESOPHAGEAL ECHOCARDIOGRAM (TEE);  Surgeon: Wonda Olds, MD;  Location: [facility] OR;  Service: Thoracic;  Laterality: N/A;   TRACHEOSTOMY  2014   09/30/12-10/20/12   VENTRAL HERNIA REPAIR N/A 07/16/2017   Procedure: LAPAROSCOPIC VENTRAL HERNIA REPAIR WITH MESH;  Surgeon: [name], MD;  Location: [place];  Service: General;  Laterality: N/A;   wrist aspiration  02/16/2012    monosodium urate crystals; Dr Caralyn Guile     reports that he quit smoking about 53 years ago. His smoking use included cigarettes. He has a 0.25 pack-year smoking history. He has never used smokeless tobacco. He reports that he does not drink alcohol and does not use drugs.  Allergies  Allergen Reactions   Hydrochlorothiazide Other (See Comments)    Gout , uncontrolled diabetes and renal insufficiency    Family History  Problem Relation Age of Onset   Heart failure Mother    Hypertension Mother    Diabetes Mother    Hypertension Father    Prostate cancer Father    Diabetes Father    Cancer Father    Heart failure Brother    Stroke Neg Hx    Heart disease Neg Hx    Colon cancer Neg Hx     Prior to Admission medications   Medication Sig Start Date End Date Taking? Authorizing Provider  ACCU-CHEK AVIVA PLUS test strip TEST BLOOD SUGAR TWICE DAILY AS DIRECTED 12/05/20   Elayne Snare, MD  ACCU-CHEK AVIVA PLUS test strip Use as instructed 12/04/20   Elayne Snare, MD  albuterol (VENTOLIN HFA) 108 (90 Base) MCG/ACT inhaler Inhale 2 puffs into the lungs every 4 (four)  hours as needed for wheezing or shortness of breath. 11/14/21   White, Leitha Schuller, NP  allopurinol (ZYLOPRIM) 100 MG tablet TAKE 1/2 TABLET ONE TIME DAILY 11/01/21   [name], MD  apixaban (ELIQUIS) 5 MG TABS tablet Take 1 tablet (5 mg total) by mouth 2 (two) times daily. 01/12/22   Jerline Pain, MD  bismuth subsalicylate (PEPTO BISMOL) 262 MG/15ML suspension Take 30 mLs by mouth every 6 (six) hours as needed for indigestion.    [provider]  colchicine (COLCRYS) 0.6 MG tablet Take 2 tabs once and then one tab one hour later as needed for gout 07/30/20   [name], MD  ergocalciferol (VITAMIN D2) 1.25 MG (50000 UT) capsule  Take 50,000 Units by mouth once a week.    [provider]  glipiZIDE (GLUCOTROL XL) 2.5 MG 24 hr tablet Take 1 tablet (2.5 mg total) by mouth daily with breakfast. 05/10/21   Elayne Snare, MD  hydrALAZINE (APRESOLINE) 50 MG tablet Take 2 tablets (100 mg total) by mouth 3 (three) times daily. 11/28/21   Jerline Pain, MD  JANUVIA 25 MG tablet Take 1 tablet by mouth once daily 02/02/22   Elayne Snare, MD  nitroGLYCERIN (NITROSTAT) 0.4 MG SL tablet Place 0.4 mg under the tongue every 5 (five) minutes as needed for chest pain. 09/30/20 03/22/22  [provider]  Tafamidis (VYNDAMAX) 61 MG CAPS Take 61 mg by mouth daily. 04/28/21   Geralynn Rile, MD  torsemide [facility]) 20 MG tablet Patient take 20 mg tablet by mouth every Monday, Wednesday, and friday    [provider]  TRUEplus Lancets 33G MISC USE 4 (FOUR) TIMES DAILY 04/04/21   Elayne Snare, MD    Physical Exam: Vitals:   03/22/22 2258 03/23/22 0123 03/23/22 0129 03/23/22 0346  BP: 134/81 139/73  (!) 150/93  Pulse: (!) 116 (!) 51  (!) 53  Resp: '18 20  19  '$ Temp:   98 F (36.7 C)   TempSrc:   Oral   SpO2: 94% 100%  97%  Weight:      Height:        Physical Exam Vitals and nursing note reviewed.  Constitutional:      General: He is not in acute distress.    Appearance:  He is not toxic-appearing or diaphoretic.  HENT:     Head: Normocephalic and atraumatic.     Nose: Nose normal.  Cardiovascular:     Rate and Rhythm: Normal rate. Rhythm irregular.     Comments: 8 CM JVD at sternal notch Pulmonary:     Breath sounds: Rales present.  Abdominal:     General: Bowel sounds are normal. There is no distension.     Tenderness: There is no abdominal tenderness. There is no guarding.  Musculoskeletal:     Left lower leg: Edema present.     Comments: Right LE wrapped in compression bandages  Skin:    General: Skin is warm and dry.     Capillary Refill: Capillary refill takes less than 2 seconds.  Neurological:     Mental Status: He is alert and oriented to person, place, and time.      Labs on Admission: I have personally reviewed following labs and imaging studies  CBC: Recent Labs  Lab 03/22/22 1659  WBC 2.7*  NEUTROABS 1.6*  HGB 11.6*  HCT 36.4*  MCV 87.7  PLT 315*   Basic Metabolic Panel: Recent Labs  Lab 03/22/22 1659  NA 143  K 3.8  CL 113*  CO2 19*  GLUCOSE 78  BUN 70*  CREATININE 4.58*  CALCIUM 9.6   GFR: Estimated Creatinine Clearance: 14.3 mL/min (A) (by C-G formula based on SCr of 4.58 mg/dL (H)). Liver Function Tests: No results for input(s): "AST", "ALT", "ALKPHOS", "BILITOT", "PROT", "ALBUMIN" in the last 168 hours. No results for input(s): "LIPASE", "AMYLASE" in the last 168 hours. No results for input(s): "AMMONIA" in the last 168 hours. Coagulation Profile: No results for input(s): "INR", "PROTIME" in the last 168 hours. Cardiac Enzymes: No results for input(s): "CKTOTAL", "CKMB", "CKMBINDEX", "TROPONINI", "TROPONINIHS" in the last 168 hours. BNP (last 3 results) Recent Labs    01/05/22 1125  PROBNP 1,740.0*  HbA1C: No results for input(s): "HGBA1C" in the last 72 hours. CBG: No results for input(s): "GLUCAP" in the last 168 hours. Lipid Profile: No results for input(s): "CHOL", "HDL", "LDLCALC", "TRIG",  "CHOLHDL", "LDLDIRECT" in the last 72 hours. Thyroid Function Tests: No results for input(s): "TSH", "T4TOTAL", "FREET4", "T3FREE", "THYROIDAB" in the last 72 hours. Anemia Panel: No results for input(s): "VITAMINB12", "FOLATE", "FERRITIN", "TIBC", "IRON", "RETICCTPCT" in the last 72 hours. Urine analysis:    Component Value Date/Time   COLORURINE YELLOW 09/25/2020 2131   APPEARANCEUR CLEAR 09/25/2020 2131   LABSPEC 1.022 09/25/2020 2131   PHURINE 5.0 09/25/2020 2131   GLUCOSEU >=500 (A) 09/25/2020 2131   GLUCOSEU NEGATIVE 07/20/2014 1055   HGBUR NEGATIVE 09/25/2020 2131   HGBUR negative 08/28/2008 1446   BILIRUBINUR NEGATIVE 09/25/2020 2131   BILIRUBINUR Neg 10/28/2014 0916   KETONESUR NEGATIVE 09/25/2020 2131   PROTEINUR NEGATIVE 09/25/2020 2131   UROBILINOGEN 0.2 10/28/2014 0916   UROBILINOGEN 0.2 07/20/2014 1055   NITRITE NEGATIVE 09/25/2020 2131   LEUKOCYTESUR SMALL (A) 09/25/2020 2131    Radiological Exams on Admission: I have personally reviewed images DG Chest 2 View  Result Date: 03/22/2022 CLINICAL DATA:  Shortness of breath EXAM: CHEST - 2 VIEW COMPARISON:  01/05/2022 FINDINGS: Gross cardiomegaly. Diffuse bilateral interstitial pulmonary opacity. The visualized skeletal structures are unremarkable. IMPRESSION: Gross cardiomegaly with diffuse bilateral interstitial pulmonary opacity, likely edema. No focal airspace opacity. Electronically Signed   By: Delanna Ahmadi M.D.   On: 03/22/2022 16:56    EKG: My personal interpretation of EKG shows: atrial flutter    Assessment/Plan Principal Problem:   Acute on chronic combined systolic and diastolic heart failure (HCC) Active Problems:   Chronic kidney disease (CKD), stage IV (severe) (HCC)   Essential hypertension   Diabetes (HCC)   S/P pericardial window creation   Hereditary cardiac amyloidosis (HCC)   Bilateral edema of lower extremity   Paroxysmal atrial fibrillation (HCC)    Assessment and Plan: * Acute on  chronic combined systolic and diastolic heart failure ([place]) Admit to cardiac telemetry bed. Continue with IV lasix 80 mg q12. Likely has cardiorenal syndrome. Pt and wife aware that pt may need to start HD in order to adequately remove enough intravascular volume.  Chronic kidney disease (CKD), stage IV (severe) (HCC) Pt has left UE AVG that was placed in 01/2022. May need nephrology consult to initiate HD if IV lasix ineffective in stimulating diuresis or if Scr dramatically worsens with IV diuresis.  Paroxysmal atrial fibrillation (HCC) Currently in atrial flutter. Rate controlled. Continue with Eliquis.  Bilateral edema of lower extremity Chronic.  Hereditary cardiac amyloidosis (HCC) On Vyndamax. Followed by cardiology.  S/P pericardial window creation Stable.  Diabetes ([place]) Stable.  Essential hypertension Stable.    DVT prophylaxis: Eliquis Code Status: Full Code Family Communication: discussed with pt and wife [name]  Disposition Plan: return home  Consults called: will need cardiology consult and nephrology consults in the AM  Admission status: Inpatient, Telemetry bed   Kristopher Oppenheim, DO [facility] 03/23/2022, 4:52 AM

## 2022-03-23 NOTE — Assessment & Plan Note (Signed)
Chronic. 

## 2022-03-23 NOTE — ED Notes (Signed)
Wound cleansed, new dressing in place.

## 2022-03-23 NOTE — Assessment & Plan Note (Signed)
Admit to cardiac telemetry bed. Continue with IV lasix 80 mg q12. Likely has cardiorenal syndrome. Pt and wife aware that pt may need to start HD in order to adequately remove enough intravascular volume.

## 2022-03-23 NOTE — Progress Notes (Addendum)
Patient seen and examined, admitted earlier this morning by Dr. Bridgett Larsson briefly Mr. Friedt is a 75/M with history of atrial fibrillation on Eliquis, history of CVA, chronic systolic and diastolic CHF, EF 90-38%, cardiac amyloidosis, CKD 4-5, status post creation of left arm AV fistula on 9/19, chronic right leg wound followed at the wound center presented to the ED from cardiology clinic with worsening dyspnea on exertion, orthopnea, edema. -In the ED BNP was 1571, chest x-ray noted bilateral interstitial edema, BUN was 70, creatinine 4.5, EKG noted atrial flutter  Acute on chronic combined systolic and diastolic heart failure  Known cardiac amyloidosis, positive genetic testing for ATTR amyloid -Improving, IV Lasix 120 Mg x1 now -Monitor urine output, follow-up repeat echocardiogram -requested nephrology input, left arm AV fistula placed 9/19 -Depending on clinical course may need to start HD this admission, -BMP in a.m.  Elevated troponin -could be due to demand, trend, FU ECHo -[facility] consulting   AKI on chronic kidney disease (CKD), stage IV (severe) (HCC) Pt has left UE AVF that was placed in 01/31/2022. -Creatinine worse than baseline, 4.48 now, baseline around 3.5, possibly cardiorenal -nephrology consulted -Diuretics as noted above   Paroxysmal atrial fibrillation (HCC) Currently in atrial flutter.  -Rate controlled. Continue with Eliquis.   Right lower leg chronic wound -Managed at the wound center and with home health RN, due to be changed today, will request WOC consult   Hereditary cardiac amyloidosis ([place]) On tafamidis.    S/P pericardial window creation -Fu repeat ECHO   Diabetes ([place]) Stable.,  A1c is 5.7, continue sliding scale insulin   Essential hypertension Stable.    DVT prophylaxis: Eliquis Code Status: Full Code Family Communication: discussed with pt and wife [name], MD

## 2022-03-23 NOTE — ED Provider Notes (Signed)
[facility] Emergency Department Provider Note MRN:  [id number]  Arrival date & time: 03/23/22     Chief Complaint   Shortness of Breath   History of Present Illness   Corey [name] is a 76 y.o. year-old male with a history of CHF presenting to the ED with chief complaint of shortness of breath.  Worsening shortness of breath and lower extremity edema for the past several days.  Increase the Lasix but not helping.  Worse when laying flat.  No chest pain, no fever, no cough, no abdominal pain.  Review of Systems  A thorough review of systems was obtained and all systems are negative except as noted in the HPI and PMH.   Patient's Health History    Past Medical History:  Diagnosis Date   Cataract    CHF (congestive heart failure) ([place])    Chronic heart failure with preserved ejection fraction (HFpEF) (HCC)    Colon polyp 2003   Dr [name]; F/U was to be 2008( not completed)   Coronary artery disease    CVA (cerebral infarction) 2014   Diabetes mellitus    Diverticulosis 2003   Dyspnea    with exertion   Gout    Hereditary cardiac amyloidosis (HCC)    Hypertension    Myocardial infarction ([place]) 09/19/2012   PEA cardiac arrest in setting of acute respiratory failure/pulmonary edema   OSA (obstructive sleep apnea) 03/27/2018   Pneumonia 09/29/2011   Avelox X 10 days as OP   Prostate cancer ([place])    Renal insufficiency    Seizures ([place]) 09/19/2012   not treated for seizure disorder; had a seizure after stroke 2014; no seizure since then   Stroke [facility])     Past Surgical History:  Procedure Laterality Date   AV FISTULA PLACEMENT Left 01/31/2022   Procedure: LEFT ARM ARTERIOVENOUS (AV) FISTULA CREATION;  Surgeon: Waynetta Sandy, MD;  Location: [place];  Service: Vascular;  Laterality: Left;  regional block to left arm   COLONOSCOPY W/ POLYPECTOMY  2003   no F/U ([place] discussed 12/03/12)   INGUINAL HERNIA REPAIR Left 06/16/2021    Procedure: LAPAROSCOPIC, POSSIBLY OPEN LEFT INGUINAL HERNIA REPAIR WITH MESH;  Surgeon: Felicie Morn, MD;  [facility];  Service: General;  Laterality: Left;   INSERTION OF MESH N/A 07/16/2017   Procedure: INSERTION OF MESH;  Surgeon: [name], MD;  Location: [place] OR;  Service: General;  Laterality: N/A;   PEG PLACEMENT  2014   10/08/12-12/10/12   PEG TUBE REMOVAL  2014   PROSTATE BIOPSY     RIGHT/LEFT HEART CATH AND CORONARY ANGIOGRAPHY N/A 09/20/2020   Procedure: RIGHT/LEFT HEART CATH AND CORONARY ANGIOGRAPHY;  Surgeon: Leonie Man, MD;  Location: [place] CV LAB;  Service: Cardiovascular;  Laterality: N/A;   TEE WITHOUT CARDIOVERSION N/A 09/27/2020   Procedure: TRANSESOPHAGEAL ECHOCARDIOGRAM (TEE);  Surgeon: Wonda Olds, MD;  Location: [facility] OR;  Service: Thoracic;  Laterality: N/A;   TRACHEOSTOMY  2014   09/30/12-10/20/12   VENTRAL HERNIA REPAIR N/A 07/16/2017   Procedure: LAPAROSCOPIC VENTRAL HERNIA REPAIR WITH MESH;  Surgeon: [name], MD;  [facility];  Service: General;  Laterality: N/A;   wrist aspiration  02/16/2012    monosodium urate crystals; Dr Caralyn Guile    Family History  Problem Relation Age of Onset   Heart failure Mother    Hypertension Mother    Diabetes Mother    Hypertension Father  Prostate cancer Father    Diabetes Father    Cancer Father    Heart failure Brother    Stroke Neg Hx    Heart disease Neg Hx    Colon cancer Neg Hx     Social History   Socioeconomic History   Marital status: Married    Spouse name: [name]   Number of children: 2   Years of education: 12   Highest education level: 12th grade  Occupational History   Not on file  Tobacco Use   Smoking status: Former    Packs/day: 0.25    Years: 1.00    Total pack years: 0.25    Types: Cigarettes    Quit date: 05/15/1968    Years since quitting: 53.8   Smokeless tobacco: Never   Tobacco comments:    smoked [place], up to 1-2 cigarettes/ day   Vaping Use   Vaping Use: Never used  Substance and Sexual Activity   Alcohol use: No    Alcohol/week: 0.0 standard drinks of alcohol   Drug use: No   Sexual activity: Yes  Other Topics Concern   Not on file  Social History Narrative   Walks three times a week, 2-3 miles, occasional walks on treadmill       Patient is right-handed. He lives with his wife in a one level home. He drinks 1 diet Mt. Dew a day and rarely has coffee. He walks 3 x a week for exercise.   Social Determinants of Health   Financial Resource Strain: Low Risk  (02/02/2022)   Overall Financial Resource Strain (CARDIA)    Difficulty of Paying Living Expenses: Not hard at all  Food Insecurity: No Food Insecurity (02/02/2022)   Hunger Vital Sign    Worried About Running Out of Food in the Last Year: Never true    Ran Out of Food in the Last Year: Never true  Transportation Needs: No Transportation Needs (02/02/2022)   PRAPARE - Hydrologist (Medical): No    Lack of Transportation (Non-Medical): No  Physical Activity: Inactive (02/02/2022)   Exercise Vital Sign    Days of Exercise per Week: 0 days    Minutes of Exercise per Session: 0 min  Stress: No Stress Concern Present (02/02/2022)   [place]    Feeling of Stress : Not at all  Social Connections: [place] (02/02/2022)   Social Connection and Isolation Panel [NHANES]    Frequency of Communication with Friends and Family: More than three times a week    Frequency of Social Gatherings with Friends and Family: More than three times a week    Attends Religious Services: More than 4 times per year    Active Member of Clubs or Organizations: Yes    Attends Archivist Meetings: More than 4 times per year    Marital Status: Married  Human resources officer Violence: Not At Risk (02/02/2022)   Humiliation, Afraid, Rape, and Kick questionnaire    Fear of Current  or Ex-Partner: No    Emotionally Abused: No    Physically Abused: No    Sexually Abused: No     Physical Exam   Vitals:   03/23/22 0129 03/23/22 0346  BP:  (!) 150/93  Pulse:  (!) 53  Resp:  19  Temp: 98 F (36.7 C)   SpO2:  97%    CONSTITUTIONAL: Chronically ill-appearing, NAD NEURO/PSYCH:  Alert and oriented x  3, no focal deficits EYES:  eyes equal and reactive ENT/NECK:  no LAD, no JVD CARDIO: Regular rate, well-perfused, normal S1 and S2 PULM:  CTAB no wheezing or rhonchi GI/GU:  non-distended, non-tender MSK/SPINE:  No gross deformities, pitting edema to bilateral lower extremities SKIN:  no rash, atraumatic   *Additional and/or pertinent findings included in MDM below  Diagnostic and Interventional Summary    EKG Interpretation  Date/Time:  Wednesday March 22 2022 16:10:26 EST Ventricular Rate:  51 PR Interval:    QRS Duration: 116 QT Interval:  474 QTC Calculation: 436 R Axis:   -68 Text Interpretation: Atrial flutter with variable A-V block Left anterior fascicular block Possible Anterior infarct , age undetermined Abnormal ECG When compared with ECG of 26-Sep-2020 23:09, PREVIOUS ECG IS PRESENT Confirmed by Gerlene Fee [phone]) on 03/23/2022 4:08:18 AM       Labs Reviewed  BASIC METABOLIC PANEL - Abnormal; Notable for the following components:      Result Value   Chloride 113 (*)    CO2 19 (*)    BUN 70 (*)    Creatinine, Ser 4.58 (*)    GFR, Estimated 13 (*)    All other components within normal limits  CBC WITH DIFFERENTIAL/PLATELET - Abnormal; Notable for the following components:   WBC 2.7 (*)    RBC 4.15 (*)    Hemoglobin 11.6 (*)    HCT 36.4 (*)    RDW 17.1 (*)    Platelets 103 (*)    Neutro Abs 1.6 (*)    Lymphs Abs 0.6 (*)    All other components within normal limits  BRAIN NATRIURETIC PEPTIDE - Abnormal; Notable for the following components:   B Natriuretic Peptide 1,571.3 (*)    All other components within normal limits    DG  Chest 2 View  Final Result      Medications  furosemide (LASIX) injection 40 mg (has no administration in time range)     Procedures  /  Critical Care .Critical Care  Performed by: Maudie Flakes, MD Authorized by: Maudie Flakes, MD   Critical care provider statement:    Critical care time (minutes):  32   Critical care was necessary to treat or prevent imminent or life-threatening deterioration of the following conditions:  Renal failure   Critical care was time spent personally by me on the following activities:  Development of treatment plan with patient or surrogate, discussions with consultants, evaluation of patient's response to treatment, examination of patient, ordering and review of laboratory studies, ordering and review of radiographic studies, ordering and performing treatments and interventions, pulse oximetry, re-evaluation of patient's condition and review of old charts   ED Course and Medical Decision Making  Initial Impression and Ddx History and exam suspicious for CHF exacerbation.  Patient also seems to be in atrial flutter on cardiac monitoring, which may be contributing to the heart failure presentation.  Labs reveal significant worsening of kidney function and suggestive of cardiorenal syndrome.  Patient is without increased work of breathing, no hypoxia, hemodynamically doing well.  Chest x-ray does show pulmonary edema.  Patient will need admission.  Past medical/surgical history that increases complexity of ED encounter: CHF  Interpretation of Diagnostics I personally reviewed the EKG and my interpretation is as follows: Atrial flutter  Worsening renal function, markedly elevated BNP.  Patient Reassessment and Ultimate Disposition/Management     Admit  Patient management required discussion with the following services or consulting groups:  Hospitalist Service  Complexity of Problems Addressed Acute illness or injury that poses threat of life of  bodily function  Additional Data Reviewed and Analyzed Further history obtained from: Further history from spouse/family member  Additional Factors Impacting ED Encounter Risk Consideration of hospitalization  Barth Kirks. [place] Small, [place] [email or web address]  Final Clinical Impressions(s) / ED Diagnoses     ICD-10-CM   1. Acute on chronic congestive heart failure, unspecified heart failure type ([place])  I50.9     2. Cardiorenal syndrome with renal failure, stage 1-4 or unspecified chronic kidney disease, with heart failure (HCC)  I13.0     3. Atrial flutter, unspecified type [facility])  I48.92       ED Discharge Orders     None        Discharge Instructions Discussed with and Provided to Patient:   Discharge Instructions   None      Maudie Flakes, MD 03/23/22 [phone]

## 2022-03-23 NOTE — ED Notes (Signed)
ED TO INPATIENT HANDOFF REPORT  ED Nurse Name and Phone #: Andee Poles [phone]  S Name/Age/Gender Corey [name] 76 y.o. male Room/Bed: 008C/008C  Code Status   Code Status: Full Code  Home/SNF/Other Home Patient oriented to: self, place, time, situation Is this baseline? Yes   Triage Complete: Triage complete  Chief Complaint Acute on chronic combined systolic and diastolic heart failure ([place]) [I50.43]  Triage Note [facility] bc past two weeks has had increased SOB and fatigue.  SOB with exertion.  Wife reports slurred speech x 2.  Patient has hx of stroke and afib.  HR46-50 irregular.  RLL diminished 134/74 RR22 100% RA  recently had HD cath placed due to dx of renal failure and needing dialysis.    Allergies Allergies  Allergen Reactions   Hydrochlorothiazide Other (See Comments)    Gout , uncontrolled diabetes and renal insufficiency    Level of Care/Admitting Diagnosis ED Disposition     ED Disposition  Admit   Condition  --   [place]: [place] [[id number]]  Level of Care: Telemetry Cardiac [103]  May admit patient to [facility] or [facility] if equivalent level of care is available:: No  Covid Evaluation: Asymptomatic - no recent exposure (last 10 days) testing not required  Diagnosis: Acute on chronic combined systolic and diastolic heart failure ([place]) [428.43.ICD-9-CM]  Admitting Physician: Bridgett Larsson, [place]  Attending Physician: Bridgett Larsson, ERIC [[id number]]  Certification:: I certify this patient will need inpatient services for at least 2 midnights  Estimated Length of Stay: 5          B Medical/Surgery History Past Medical History:  Diagnosis Date   Cataract    CHF (congestive heart failure) ([place])    Chronic heart failure with preserved ejection fraction (HFpEF) ([place])    Colon polyp 2003   Dr [name]; F/U was to be 2008( not completed)   Coronary artery disease    CVA (cerebral infarction) 2014   Diabetes  mellitus    Diverticulosis 2003   Dyspnea    with exertion   Gout    Hereditary cardiac amyloidosis (HCC)    Hypertension    Myocardial infarction ([place]) 09/19/2012   PEA cardiac arrest in setting of acute respiratory failure/pulmonary edema   OSA (obstructive sleep apnea) 03/27/2018   Pneumonia 09/29/2011   Avelox X 10 days as OP   Prostate cancer ([place])    Renal insufficiency    Seizures ([place]) 09/19/2012   not treated for seizure disorder; had a seizure after stroke 2014; no seizure since then   Stroke [facility])    Past Surgical History:  Procedure Laterality Date   AV FISTULA PLACEMENT Left 01/31/2022   Procedure: LEFT ARM ARTERIOVENOUS (AV) FISTULA CREATION;  Surgeon: Waynetta Sandy, MD;  Location: [place];  Service: Vascular;  Laterality: Left;  regional block to left arm   COLONOSCOPY W/ POLYPECTOMY  2003   no F/U ([place] discussed 12/03/12)   INGUINAL HERNIA REPAIR Left 06/16/2021   Procedure: LAPAROSCOPIC, POSSIBLY OPEN LEFT INGUINAL HERNIA REPAIR WITH MESH;  Surgeon: Felicie Morn, MD;  [facility];  Service: General;  Laterality: Left;   INSERTION OF MESH N/A 07/16/2017   Procedure: INSERTION OF MESH;  Surgeon: [name], MD;  Location: [place];  Service: General;  Laterality: N/A;   PEG PLACEMENT  2014   10/08/12-12/10/12   PEG TUBE REMOVAL  2014   PROSTATE BIOPSY     RIGHT/LEFT  HEART CATH AND CORONARY ANGIOGRAPHY N/A 09/20/2020   Procedure: RIGHT/LEFT HEART CATH AND CORONARY ANGIOGRAPHY;  Surgeon: Leonie Man, MD;  Location: [place] CV LAB;  Service: Cardiovascular;  Laterality: N/A;   TEE WITHOUT CARDIOVERSION N/A 09/27/2020   Procedure: TRANSESOPHAGEAL ECHOCARDIOGRAM (TEE);  Surgeon: Wonda Olds, MD;  Location: [facility] OR;  Service: Thoracic;  Laterality: N/A;   TRACHEOSTOMY  2014   09/30/12-10/20/12   VENTRAL HERNIA REPAIR N/A 07/16/2017   Procedure: LAPAROSCOPIC VENTRAL HERNIA REPAIR WITH MESH;  Surgeon: [name], MD;  Location: [place];  Service: General;  Laterality: N/A;   wrist aspiration  02/16/2012    monosodium urate crystals; Dr Gaetano Hawthorne IV Location/Drains/Wounds Patient Lines/Drains/Airways Status     Active Line/Drains/Airways     Name Placement date Placement time Site Days   Peripheral IV 03/23/22 20 G Anterior;Right Forearm 03/23/22  0454  Forearm  less than 1   Fistula / Graft Left Upper arm Arteriovenous fistula 01/31/22  1019  Upper arm  51   Chest Tube 1 Lateral Mediastinal 28 Fr. 09/27/20  1345  Mediastinal  542   Incision (Closed) 01/31/22 Arm Left 01/31/22  1025  -- 51   Incision - 3 Ports Abdomen Left;Upper Umbilicus Right;Lower 06/16/21  0753  -- 280            Intake/Output Last 24 hours  Intake/Output Summary (Last 24 hours) at 03/23/2022 1309 Last data filed at 03/23/2022 1229 Gross per 24 hour  Intake 50 ml  Output 1800 ml  Net -1750 ml    Labs/Imaging Results for orders placed or performed during the hospital encounter of 03/22/22 (from the past 48 hour(s))  Basic metabolic panel     Status: Abnormal   Collection Time: 03/22/22  4:59 PM  Result Value Ref Range   Sodium 143 135 - 145 mmol/L   Potassium 3.8 3.5 - 5.1 mmol/L   Chloride 113 (H) 98 - 111 mmol/L   CO2 19 (L) 22 - 32 mmol/L   Glucose, Bld 78 70 - 99 mg/dL    Comment: Glucose reference range applies only to samples taken after fasting for at least 8 hours.   BUN 70 (H) 8 - 23 mg/dL   Creatinine, Ser 4.58 (H) 0.61 - 1.24 mg/dL   Calcium 9.6 8.9 - 10.3 mg/dL   GFR, Estimated 13 (L) >60 mL/min    Comment: (NOTE) Calculated using the CKD-EPI Creatinine Equation (2021)    Anion gap 11 5 - 15    Comment: Performed at [facility] [address] [place], [place] [address]  CBC with Differential     Status: Abnormal   Collection Time: 03/22/22  4:59 PM  Result Value Ref Range   WBC 2.7 (L) 4.0 - 10.5 K/uL   RBC 4.15 (L) 4.22 - 5.81 MIL/uL   Hemoglobin 11.6 (L) 13.0 - 17.0 g/dL   HCT 36.4 (L) 39.0 -  52.0 %   MCV 87.7 80.0 - 100.0 fL   MCH 28.0 26.0 - 34.0 pg   MCHC 31.9 30.0 - 36.0 g/dL   RDW 17.1 (H) 11.5 - 15.5 %   Platelets 103 (L) 150 - 400 K/uL   nRBC 0.0 0.0 - 0.2 %   Neutrophils Relative % 61 %   Neutro Abs 1.6 (L) 1.7 - 7.7 K/uL   Lymphocytes Relative 22 %   Lymphs Abs 0.6 (L) 0.7 - 4.0 K/uL   Monocytes Relative 16 %  Monocytes Absolute 0.4 0.1 - 1.0 K/uL   Eosinophils Relative 0 %   Eosinophils Absolute 0.0 0.0 - 0.5 K/uL   Basophils Relative 1 %   Basophils Absolute 0.0 0.0 - 0.1 K/uL   Immature Granulocytes 0 %   Abs Immature Granulocytes 0.01 0.00 - 0.07 K/uL    Comment: Performed at [facility], [place] [address], [place], [place] [address]  Brain natriuretic peptide     Status: Abnormal   Collection Time: 03/22/22  4:59 PM  Result Value Ref Range   B Natriuretic Peptide 1,571.3 (H) 0.0 - 100.0 pg/mL    Comment: Performed at [facility] [address] [place], [place] [address]  Troponin I (High Sensitivity)     Status: Abnormal   Collection Time: 03/23/22  5:00 AM  Result Value Ref Range   Troponin I (High Sensitivity) 856 (HH) <18 ng/L    Comment: CRITICAL RESULT CALLED TO, READ BACK BY AND VERIFIED WITH LIZBETH CABRERA RN 03/23/22 6720 M KOROLESKI (NOTE) Elevated high sensitivity troponin I (hsTnI) values and significant  changes across serial measurements may suggest ACS but many other  chronic and acute conditions are known to elevate hsTnI results.  Refer to the "Links" section for chest pain algorithms and additional  guidance. Performed at [facility], [place] [address] [place], [place] [address]   Troponin I (High Sensitivity)     Status: Abnormal   Collection Time: 03/23/22  6:26 AM  Result Value Ref Range   Troponin I (High Sensitivity) 777 (HH) <18 ng/L    Comment: CRITICAL VALUE NOTED. VALUE IS CONSISTENT WITH PREVIOUSLY REPORTED/CALLED VALUE (NOTE) Elevated high sensitivity troponin I (hsTnI) values and significant   changes across serial measurements may suggest ACS but many other  chronic and acute conditions are known to elevate hsTnI results.  Refer to the "Links" section for chest pain algorithms and additional  guidance. Performed at [facility], [place] [address] [place],  [address]   Hemoglobin A1c     Status: Abnormal   Collection Time: 03/23/22  6:30 AM  Result Value Ref Range   Hgb A1c MFr Bld 5.7 (H) 4.8 - 5.6 %    Comment: (NOTE) Pre diabetes:          5.7%-6.4%  Diabetes:              >6.4%  Glycemic control for   <7.0% adults with diabetes    Mean Plasma Glucose 116.89 mg/dL    Comment: Performed at [facility] [address], [place],  [address]  CBG monitoring, ED     Status: Abnormal   Collection Time: 03/23/22  9:11 AM  Result Value Ref Range   Glucose-Capillary 125 (H) 70 - 99 mg/dL    Comment: Glucose reference range applies only to samples taken after fasting for at least 8 hours.  CBG monitoring, ED     Status: None   Collection Time: 03/23/22 12:36 PM  Result Value Ref Range   Glucose-Capillary 85 70 - 99 mg/dL    Comment: Glucose reference range applies only to samples taken after fasting for at least 8 hours.   DG Chest 2 View  Result Date: 03/22/2022 CLINICAL DATA:  Shortness of breath EXAM: CHEST - 2 VIEW COMPARISON:  01/05/2022 FINDINGS: Gross cardiomegaly. Diffuse bilateral interstitial pulmonary opacity. The visualized skeletal structures are unremarkable. IMPRESSION: Gross cardiomegaly with diffuse bilateral interstitial pulmonary opacity, likely edema. No focal airspace opacity.  Electronically Signed   By: Delanna Ahmadi M.D.   On: 03/22/2022 16:56    Pending Labs Unresulted Labs (From admission, onward)     Start     Ordered   03/24/22 4166  Basic metabolic panel  Tomorrow morning,   R        03/23/22 0746   03/24/22 0500  CBC  Tomorrow morning,   R        03/23/22 0746   03/23/22 1257  Lipid panel  Once,   R         03/23/22 1257   03/23/22 1257  Vitamin B12  (Anemia Panel (PNL))  Once,   R        03/23/22 1257   03/23/22 1257  Folate  (Anemia Panel (PNL))  Once,   R        03/23/22 1257   03/23/22 1257  Iron and TIBC  (Anemia Panel (PNL))  Once,   R        03/23/22 1257   03/23/22 1257  Ferritin  (Anemia Panel (PNL))  Once,   R        03/23/22 1257   03/23/22 1257  Reticulocytes  (Anemia Panel (PNL))  Once,   R        03/23/22 1257            Vitals/Pain Today's Vitals   03/23/22 0833 03/23/22 1125 03/23/22 1130 03/23/22 1215  BP: (!) 154/95  133/69 130/76  Pulse: (!) 48  (!) 48 (!) 48  Resp: 17  20 (!) 21  Temp:  97.6 F (36.4 C)    TempSrc:  Oral    SpO2: 94%  100% 100%  Weight:      Height:      PainSc:        Isolation Precautions No active isolations  Medications Medications  allopurinol (ZYLOPRIM) tablet 50 mg (50 mg Oral Given 03/23/22 0921)  Tafamidis CAPS 61 mg (61 mg Oral Not Given 03/23/22 1051)  apixaban (ELIQUIS) tablet 5 mg (5 mg Oral Given 03/23/22 0921)  insulin aspart (novoLOG) injection 0-9 Units ( Subcutaneous Not Given 03/23/22 1238)  ondansetron (ZOFRAN) tablet 4 mg (has no administration in time range)    Or  ondansetron (ZOFRAN) injection 4 mg (has no administration in time range)  furosemide (LASIX) 120 mg in dextrose 5 % 50 mL IVPB (has no administration in time range)  metolazone (ZAROXOLYN) tablet 5 mg (has no administration in time range)  aspirin chewable tablet 81 mg (has no administration in time range)  hydrALAZINE (APRESOLINE) tablet 100 mg (has no administration in time range)  rosuvastatin (CRESTOR) tablet 40 mg (has no administration in time range)  furosemide (LASIX) injection 40 mg (40 mg Intravenous Given 03/23/22 0457)  furosemide (LASIX) 120 mg in dextrose 5 % 50 mL IVPB (0 mg Intravenous Stopped 03/23/22 1124)    Mobility walks with device Low fall risk   Focused Assessments    R Recommendations: See Admitting Provider  Note  Report given to:   Additional Notes:

## 2022-03-24 ENCOUNTER — Inpatient Hospital Stay (HOSPITAL_COMMUNITY)
Admission: EM | Disposition: A | Discharge status: Home Health | Payer: Self-pay | Source: Home / Self Care | Attending: Internal Medicine

## 2022-03-24 ENCOUNTER — Encounter (HOSPITAL_COMMUNITY)
Admission: EM | Disposition: A | Discharge status: Home Health | Payer: Self-pay | Source: Home / Self Care | Attending: Internal Medicine

## 2022-03-24 ENCOUNTER — Inpatient Hospital Stay (HOSPITAL_COMMUNITY): Payer: Medicare HMO | Admitting: Anesthesiology

## 2022-03-24 ENCOUNTER — Inpatient Hospital Stay (HOSPITAL_COMMUNITY): Payer: Medicare HMO

## 2022-03-24 ENCOUNTER — Encounter (HOSPITAL_COMMUNITY): Payer: Self-pay | Admitting: Internal Medicine

## 2022-03-24 DIAGNOSIS — Z87891 Personal history of nicotine dependence: Secondary | ICD-10-CM

## 2022-03-24 DIAGNOSIS — R9431 Abnormal electrocardiogram [ECG] [EKG]: Secondary | ICD-10-CM

## 2022-03-24 DIAGNOSIS — N184 Chronic kidney disease, stage 4 (severe): Secondary | ICD-10-CM

## 2022-03-24 DIAGNOSIS — D631 Anemia in chronic kidney disease: Secondary | ICD-10-CM

## 2022-03-24 DIAGNOSIS — I4891 Unspecified atrial fibrillation: Secondary | ICD-10-CM

## 2022-03-24 DIAGNOSIS — E1122 Type 2 diabetes mellitus with diabetic chronic kidney disease: Secondary | ICD-10-CM

## 2022-03-24 DIAGNOSIS — I509 Heart failure, unspecified: Secondary | ICD-10-CM

## 2022-03-24 DIAGNOSIS — I2510 Atherosclerotic heart disease of native coronary artery without angina pectoris: Secondary | ICD-10-CM

## 2022-03-24 DIAGNOSIS — I130 Hypertensive heart and chronic kidney disease with heart failure and stage 1 through stage 4 chronic kidney disease, or unspecified chronic kidney disease: Secondary | ICD-10-CM

## 2022-03-24 DIAGNOSIS — I5043 Acute on chronic combined systolic (congestive) and diastolic (congestive) heart failure: Secondary | ICD-10-CM | POA: Diagnosis not present

## 2022-03-24 HISTORY — PX: RIGHT HEART CATH: CATH118263

## 2022-03-24 HISTORY — PX: CARDIOVERSION: SHX1299

## 2022-03-24 HISTORY — PX: TEE WITHOUT CARDIOVERSION: SHX5443

## 2022-03-24 LAB — CBC
HCT: 35.5 % — ABNORMAL LOW (ref 39.0–52.0)
Hemoglobin: 11.2 g/dL — ABNORMAL LOW (ref 13.0–17.0)
MCH: 27.7 pg (ref 26.0–34.0)
MCHC: 31.5 g/dL (ref 30.0–36.0)
MCV: 87.7 fL (ref 80.0–100.0)
Platelets: 117 10*3/uL — ABNORMAL LOW (ref 150–400)
RBC: 4.05 MIL/uL — ABNORMAL LOW (ref 4.22–5.81)
RDW: 16.7 % — ABNORMAL HIGH (ref 11.5–15.5)
WBC: 2.7 10*3/uL — ABNORMAL LOW (ref 4.0–10.5)
nRBC: 0 % (ref 0.0–0.2)

## 2022-03-24 LAB — GLUCOSE, CAPILLARY
Glucose-Capillary: 89 mg/dL (ref 70–99)
Glucose-Capillary: 65 mg/dL — ABNORMAL LOW (ref 70–99)
Glucose-Capillary: 101 mg/dL — ABNORMAL HIGH (ref 70–99)
Glucose-Capillary: 152 mg/dL — ABNORMAL HIGH (ref 70–99)

## 2022-03-24 LAB — BASIC METABOLIC PANEL
Anion gap: 13 (ref 5–15)
BUN: 76 mg/dL — ABNORMAL HIGH (ref 8–23)
CO2: 21 mmol/L — ABNORMAL LOW (ref 22–32)
Calcium: 9.3 mg/dL (ref 8.9–10.3)
Chloride: 106 mmol/L (ref 98–111)
Creatinine, Ser: 4.93 mg/dL — ABNORMAL HIGH (ref 0.61–1.24)
GFR, Estimated: 12 mL/min — ABNORMAL LOW (ref >60)
Glucose, Bld: 113 mg/dL — ABNORMAL HIGH (ref 70–99)
Potassium: 3.7 mmol/L (ref 3.5–5.1)
Sodium: 140 mmol/L (ref 135–145)

## 2022-03-24 LAB — POCT I-STAT EG7
Acid-base deficit: 3 mmol/L — ABNORMAL HIGH (ref 0.0–2.0)
Acid-base deficit: 4 mmol/L — ABNORMAL HIGH (ref 0.0–2.0)
Bicarbonate: 21.3 mmol/L (ref 20.0–28.0)
Bicarbonate: 21.9 mmol/L (ref 20.0–28.0)
Calcium, Ion: 1.26 mmol/L (ref 1.15–1.40)
Calcium, Ion: 1.26 mmol/L (ref 1.15–1.40)
HCT: 34 % — ABNORMAL LOW (ref 39.0–52.0)
HCT: 35 % — ABNORMAL LOW (ref 39.0–52.0)
Hemoglobin: 11.6 g/dL — ABNORMAL LOW (ref 13.0–17.0)
Hemoglobin: 11.9 g/dL — ABNORMAL LOW (ref 13.0–17.0)
O2 Saturation: 65 %
O2 Saturation: 66 %
Potassium: 3.6 mmol/L (ref 3.5–5.1)
Potassium: 3.6 mmol/L (ref 3.5–5.1)
Sodium: 141 mmol/L (ref 135–145)
Sodium: 142 mmol/L (ref 135–145)
TCO2: 22 mmol/L (ref 22–32)
TCO2: 23 mmol/L (ref 22–32)
pCO2, Ven: 37.1 mmHg — ABNORMAL LOW (ref 44–60)
pCO2, Ven: 37.9 mmHg — ABNORMAL LOW (ref 44–60)
pH, Ven: 7.366 (ref 7.25–7.43)
pH, Ven: 7.37 (ref 7.25–7.43)
pO2, Ven: 35 mmHg (ref 32–45)
pO2, Ven: 35 mmHg (ref 32–45)

## 2022-03-24 LAB — PHOSPHORUS: Phosphorus: 6.7 mg/dL — ABNORMAL HIGH (ref 2.5–4.6)

## 2022-03-24 SURGERY — ECHOCARDIOGRAM, TRANSESOPHAGEAL
Anesthesia: Monitor Anesthesia Care

## 2022-03-24 SURGERY — RIGHT HEART CATH
Anesthesia: LOCAL

## 2022-03-24 MED ORDER — SODIUM CHLORIDE 0.9% FLUSH
3.0000 mL | Freq: Two times a day (BID) | INTRAVENOUS | Status: DC
  Administered 2022-03-25 – 2022-03-27 (×6): 3 mL via INTRAVENOUS

## 2022-03-24 MED ORDER — SEVELAMER CARBONATE 800 MG PO TABS
800.0000 mg | ORAL_TABLET | Freq: Three times a day (TID) | ORAL | Status: DC
  Administered 2022-03-24 – 2022-03-29 (×15): 800 mg via ORAL
  Filled 2022-03-24 (×16): qty 1

## 2022-03-24 MED ORDER — PROPOFOL 10 MG/ML IV BOLUS
INTRAVENOUS | Status: DC | PRN
  Administered 2022-03-24 (×3): 10 mg via INTRAVENOUS

## 2022-03-24 MED ORDER — SODIUM CHLORIDE 0.9 % IV SOLN: 250.0000 mL | INTRAVENOUS | Status: DC | PRN

## 2022-03-24 MED ORDER — LIDOCAINE HCL (PF) 1 % IJ SOLN
INTRAMUSCULAR | Status: AC
  Filled 2022-03-24: qty 30

## 2022-03-24 MED ORDER — LABETALOL HCL 5 MG/ML IV SOLN: 10.0000 mg | INTRAVENOUS | Status: AC | PRN

## 2022-03-24 MED ORDER — SODIUM BICARBONATE 650 MG PO TABS
650.0000 mg | ORAL_TABLET | Freq: Two times a day (BID) | ORAL | Status: DC
  Administered 2022-03-24 – 2022-03-29 (×11): 650 mg via ORAL
  Filled 2022-03-24 (×11): qty 1

## 2022-03-24 MED ORDER — KETAMINE HCL 50 MG/5ML IJ SOSY
PREFILLED_SYRINGE | INTRAMUSCULAR | Status: AC
  Filled 2022-03-24: qty 5

## 2022-03-24 MED ORDER — ACETAMINOPHEN 325 MG PO TABS
650.0000 mg | ORAL_TABLET | ORAL | Status: DC | PRN
  Administered 2022-03-26 – 2022-03-27 (×3): 650 mg via ORAL
  Filled 2022-03-24 (×3): qty 2

## 2022-03-24 MED ORDER — LIDOCAINE HCL (PF) 1 % IJ SOLN
INTRAMUSCULAR | Status: DC | PRN
  Administered 2022-03-24: 2 mL

## 2022-03-24 MED ORDER — ONDANSETRON HCL 4 MG/2ML IJ SOLN: 4.0000 mg | Freq: Four times a day (QID) | INTRAMUSCULAR | Status: DC | PRN

## 2022-03-24 MED ORDER — SODIUM CHLORIDE 0.9 % IV SOLN: INTRAVENOUS | Status: DC

## 2022-03-24 MED ORDER — PROPOFOL 500 MG/50ML IV EMUL
INTRAVENOUS | Status: DC | PRN
  Administered 2022-03-24: 75 ug/kg/min via INTRAVENOUS

## 2022-03-24 MED ORDER — KETAMINE HCL 10 MG/ML IJ SOLN
INTRAMUSCULAR | Status: DC | PRN
  Administered 2022-03-24: 10 mg via INTRAVENOUS

## 2022-03-24 MED ORDER — HYDRALAZINE HCL 20 MG/ML IJ SOLN: 10.0000 mg | INTRAMUSCULAR | Status: AC | PRN

## 2022-03-24 MED ORDER — SODIUM CHLORIDE 0.9% FLUSH: 3.0000 mL | INTRAVENOUS | Status: DC | PRN

## 2022-03-24 MED ORDER — LIDOCAINE 2% (20 MG/ML) 5 ML SYRINGE
INTRAMUSCULAR | Status: DC | PRN
  Administered 2022-03-24: 40 mg via INTRAVENOUS

## 2022-03-24 MED ORDER — PHENOL 1.4 % MT LIQD
1.0000 | OROMUCOSAL | Status: DC | PRN
  Administered 2022-03-25 – 2022-03-27 (×3): 1 via OROMUCOSAL
  Filled 2022-03-24: qty 177

## 2022-03-24 MED ORDER — HEPARIN (PORCINE) IN NACL 1000-0.9 UT/500ML-% IV SOLN
INTRAVENOUS | Status: AC
  Filled 2022-03-24: qty 1000

## 2022-03-24 MED ORDER — SENNOSIDES-DOCUSATE SODIUM 8.6-50 MG PO TABS
1.0000 | ORAL_TABLET | Freq: Two times a day (BID) | ORAL | Status: DC
  Administered 2022-03-24 – 2022-03-29 (×10): 1 via ORAL
  Filled 2022-03-24 (×10): qty 1

## 2022-03-24 MED ORDER — ORAL CARE MOUTH RINSE: 15.0000 mL | OROMUCOSAL | Status: DC | PRN

## 2022-03-24 MED ORDER — HEPARIN (PORCINE) IN NACL 1000-0.9 UT/500ML-% IV SOLN
INTRAVENOUS | Status: DC | PRN
  Administered 2022-03-24: 500 mL

## 2022-03-24 SURGICAL SUPPLY — 5 items
CATH BALLN WEDGE 5F 110CM (CATHETERS) IMPLANT
GUIDEWIRE .025 260CM (WIRE) IMPLANT
PACK CARDIAC CATHETERIZATION (CUSTOM PROCEDURE TRAY) ×2 IMPLANT
SHEATH GLIDE SLENDER 4/5FR (SHEATH) IMPLANT
TRANSDUCER W/STOPCOCK (MISCELLANEOUS) ×2 IMPLANT

## 2022-03-24 NOTE — Plan of Care (Signed)
IVPB lasix d/c'd. Pt had TEE cardioversion performed. Right brachial diagnostic heart cath performed. Pt hasn't had BM since 11/8; senna BID ordered.  Problem: Education: Goal: Ability to describe self-care measures that may prevent or decrease complications (Diabetes Survival Skills Education) will improve Outcome: Progressing Goal: Individualized Educational Video(s) Outcome: Progressing   Problem: Coping: Goal: Ability to adjust to condition or change in health will improve Outcome: Progressing   Problem: Fluid Volume: Goal: Ability to maintain a balanced intake and output will improve Outcome: Progressing   Problem: Health Behavior/Discharge Planning: Goal: Ability to identify and utilize available resources and services will improve Outcome: Progressing Goal: Ability to manage health-related needs will improve Outcome: Progressing   Problem: Metabolic: Goal: Ability to maintain appropriate glucose levels will improve Outcome: Progressing   Problem: Nutritional: Goal: Maintenance of adequate nutrition will improve Outcome: Progressing Goal: Progress toward achieving an optimal weight will improve Outcome: Progressing   Problem: Skin Integrity: Goal: Risk for impaired skin integrity will decrease Outcome: Progressing   Problem: Tissue Perfusion: Goal: Adequacy of tissue perfusion will improve Outcome: Progressing   Problem: Education: Goal: Ability to demonstrate management of disease process will improve Outcome: Progressing Goal: Ability to verbalize understanding of medication therapies will improve Outcome: Progressing Goal: Individualized Educational Video(s) Outcome: Progressing   Problem: Activity: Goal: Capacity to carry out activities will improve Outcome: Progressing   Problem: Cardiac: Goal: Ability to achieve and maintain adequate cardiopulmonary perfusion will improve Outcome: Progressing   Problem: Education: Goal: Knowledge of General  Education information will improve Description: Including pain rating scale, medication(s)/side effects and non-pharmacologic comfort measures Outcome: Progressing   Problem: Health Behavior/Discharge Planning: Goal: Ability to manage health-related needs will improve Outcome: Progressing   Problem: Clinical Measurements: Goal: Ability to maintain clinical measurements within normal limits will improve Outcome: Progressing Goal: Will remain free from infection Outcome: Progressing Goal: Diagnostic test results will improve Outcome: Progressing Goal: Respiratory complications will improve Outcome: Progressing Goal: Cardiovascular complication will be avoided Outcome: Progressing   Problem: Activity: Goal: Risk for activity intolerance will decrease Outcome: Progressing   Problem: Nutrition: Goal: Adequate nutrition will be maintained Outcome: Progressing   Problem: Coping: Goal: Level of anxiety will decrease Outcome: Progressing   Problem: Elimination: Goal: Will not experience complications related to bowel motility Outcome: Progressing Goal: Will not experience complications related to urinary retention Outcome: Progressing   Problem: Pain Managment: Goal: General experience of comfort will improve Outcome: Progressing   Problem: Safety: Goal: Ability to remain free from injury will improve Outcome: Progressing   Problem: Skin Integrity: Goal: Risk for impaired skin integrity will decrease Outcome: Progressing   Problem: Education: Goal: Understanding of CV disease, CV risk reduction, and recovery process will improve Outcome: Progressing Goal: Individualized Educational Video(s) Outcome: Progressing   Problem: Activity: Goal: Ability to return to baseline activity level will improve Outcome: Progressing   Problem: Cardiovascular: Goal: Ability to achieve and maintain adequate cardiovascular perfusion will improve Outcome: Progressing Goal: Vascular  access site(s) Level 0-1 will be maintained Outcome: Progressing   Problem: Health Behavior/Discharge Planning: Goal: Ability to safely manage health-related needs after discharge will improve Outcome: Progressing

## 2022-03-24 NOTE — H&P (View-Only) (Signed)
Advanced Heart Failure Rounding Note  PCP-Cardiologist: Candee Furbish, MD   Subjective:     4.6L UOP yesterday with total 120 IV lasix TID + 5 mg metolazone. ? Weight unchanged.  Scr 4.58>4.93 BUN 70>76  Dyspnea slightly improved. Easily winded with light activity.     Objective:   Weight Range: 79.5 kg Body mass index is 28.29 kg/m.   Vital Signs:   Temp:  [97.5 F (36.4 C)-98 F (36.7 C)] 97.6 F (36.4 C) (11/10 0449) Pulse Rate:  [48-131] 131 (11/10 0449) Resp:  [19-24] 19 (11/10 0449) BP: (112-133)/(67-79) 128/79 (11/10 0449) SpO2:  [91 %-100 %] 99 % (11/10 0449) Weight:  [79.5 kg] 79.5 kg (11/10 0449) Last BM Date : 03/22/22  Weight change: Filed Weights   03/22/22 1556 03/23/22 1518 03/24/22 0449  Weight: 80.7 kg 79.5 kg 79.5 kg    Intake/Output:   Intake/Output Summary (Last 24 hours) at 03/24/2022 0833 Last data filed at 03/24/2022 0449 Gross per 24 hour  Intake 168 ml  Output 4150 ml  Net -3982 ml      Physical Exam    General:  Comfortable at rest HEENT: Normal Neck: Supple. JVP 12 cm.  Carotids 2+ bilat; no bruits.  Cor: PMI nondisplaced. Irregular rhythm, brady. No rubs, gallops or murmurs. Lungs: Clear Abdomen: Soft, nontender, nondistended.  Extremities: No cyanosis, clubbing, rash, 1+ edema, + UNNA Neuro: Alert & orientedx3, cranial nerves grossly intact. moves all 4 extremities w/o difficulty. Affect pleasant   Telemetry   Atrial flutter 50s   Labs    CBC Recent Labs    03/22/22 1659 03/24/22 0032  WBC 2.7* 2.7*  NEUTROABS 1.6*  --   HGB 11.6* 11.2*  HCT 36.4* 35.5*  MCV 87.7 87.7  PLT 103* 540*   Basic Metabolic Panel Recent Labs    03/22/22 1659 03/24/22 0032  NA 143 140  K 3.8 3.7  CL 113* 106  CO2 19* 21*  GLUCOSE 78 113*  BUN 70* 76*  CREATININE 4.58* 4.93*  CALCIUM 9.6 9.3  PHOS  --  6.7*   Liver Function Tests No results for input(s): "AST", "ALT", "ALKPHOS", "BILITOT", "PROT", "ALBUMIN" in  the last 72 hours. No results for input(s): "LIPASE", "AMYLASE" in the last 72 hours. Cardiac Enzymes No results for input(s): "CKTOTAL", "CKMB", "CKMBINDEX", "TROPONINI" in the last 72 hours.  BNP: BNP (last 3 results) Recent Labs    03/22/22 1659  BNP 1,571.3*    ProBNP (last 3 results) Recent Labs    01/05/22 1125  PROBNP 1,740.0*     D-Dimer No results for input(s): "DDIMER" in the last 72 hours. Hemoglobin A1C Recent Labs    03/23/22 0630  HGBA1C 5.7*   Fasting Lipid Panel Recent Labs    03/23/22 1615  CHOL 86  HDL 44  LDLCALC 31  TRIG 54  CHOLHDL 2.0   Thyroid Function Tests No results for input(s): "TSH", "T4TOTAL", "T3FREE", "THYROIDAB" in the last 72 hours.  Invalid input(s): "FREET3"  Other results:   Imaging    CT HEAD WO CONTRAST (5MM)  Result Date: 03/23/2022 CLINICAL DATA:  Acute neuro deficit. Slurred speech. Patient on dialysis. EXAM: CT HEAD WITHOUT CONTRAST TECHNIQUE: Contiguous axial images were obtained from the base of the skull through the vertex without intravenous contrast. RADIATION DOSE REDUCTION: This exam was performed according to the departmental dose-optimization program which includes automated exposure control, adjustment of the mA and/or kV according to patient size and/or use of iterative reconstruction technique.  COMPARISON:  CT head 09/19/2012.  MRI head with contrast 01/07/2015 FINDINGS: Brain: Extensive and diffuse dural ossification involving the falx and the dura around the cerebral hemispheres bilaterally. There is also involvement of the tentorium. This shows mild progression since 2008. There is diffuse dural enhancement on the prior MRI. No brain edema identified. Mild atrophy. Ventricle size normal. Mild white matter hypodensity bilaterally. Negative for acute infarct or hemorrhage. Vascular: Negative for hyperdense vessel Skull: No focal skeletal lesion. Sinuses/Orbits: Paranasal sinuses clear.  Left cataract  extraction Other: None IMPRESSION: 1. No acute intracranial abnormality. Mild atrophy and mild chronic microvascular ischemic change in the white matter. 2. Extensive and diffuse dural ossification. This has progressed since 2008. Consider metabolic disorder. Correlate with serum calcium and parathyroid hormone level. Diffuse meningioma also possible. Recommend follow-up MRI head without and with contrast for comparison with 2016. Electronically Signed   By: Franchot Gallo M.D.   On: 03/23/2022 15:14   ECHOCARDIOGRAM COMPLETE  Result Date: 03/23/2022    ECHOCARDIOGRAM REPORT   Patient Name:   Corey [name] Date of Exam: 03/23/2022 Medical Rec #:  [id number]    Height:       66.0 in Accession #:    [id number]   Weight:       178.0 lb Date of Birth:  06-06-1945    BSA:          1.903 m Patient Age:    76 years     BP:           136/122 mmHg Patient Gender: M            HR:           48 bpm. Exam Location:  Inpatient Procedure: 2D Echo, Cardiac Doppler, Color Doppler and Intracardiac            Opacification Agent Indications:    CHF-Acute Systolic W29.56  History:        Patient has prior history of Echocardiogram examinations, most                 recent 09/30/2020. CAD; Arrythmias:Bradycardia with first-degree                 block. Chronic systolic heart failure/cardiac amyloidosis.                 History of pericardial effusion s/p pericardial window.  Sonographer:    Darlina Sicilian RDCS Referring Phys: [place]  1. Speckled appearance to LV myocardium consistent with diagnosis of amyloid Global hypokinesis worse in the posterior lateral wall . Left ventricular ejection fraction, by estimation, is 30 to 35%. The left ventricle has moderately decreased function. The left ventricle demonstrates global hypokinesis. There is severe left ventricular hypertrophy. Left ventricular diastolic parameters are indeterminate. Elevated left ventricular end-diastolic pressure.  2. Right ventricular systolic  function is normal. The right ventricular size is normal. There is moderately elevated pulmonary artery systolic pressure.  3. Left atrial size was moderately dilated.  4. Right atrial size was moderately dilated.  5. The mitral valve is normal in structure. Mild mitral valve regurgitation. No evidence of mitral stenosis.  6. The aortic valve is tricuspid. There is mild calcification of the aortic valve. There is mild thickening of the aortic valve. Aortic valve regurgitation is not visualized. Aortic valve sclerosis is present, with no evidence of aortic valve stenosis.  7. Aortic dilatation noted. There is moderate dilatation of the ascending aorta, measuring 41 mm.  8. The inferior vena cava is dilated in size with <50% respiratory variability, suggesting right atrial pressure of 15 mmHg. FINDINGS  Left Ventricle: Speckled appearance to LV myocardium consistent with diagnosis of amyloid Global hypokinesis worse in the posterior lateral wall. Left ventricular ejection fraction, by estimation, is 30 to 35%. The left ventricle has moderately decreased function. The left ventricle demonstrates global hypokinesis. Definity contrast agent was given IV to delineate the left ventricular endocardial borders. The left ventricular internal cavity size was normal in size. There is severe left ventricular hypertrophy. Left ventricular diastolic parameters are indeterminate. Elevated left ventricular end-diastolic pressure. Right Ventricle: The right ventricular size is normal. No increase in right ventricular wall thickness. Right ventricular systolic function is normal. There is moderately elevated pulmonary artery systolic pressure. The tricuspid regurgitant velocity is 2.86 m/s, and with an assumed right atrial pressure of 15 mmHg, the estimated right ventricular systolic pressure is 28.3 mmHg. Left Atrium: Left atrial size was moderately dilated. Right Atrium: Right atrial size was moderately dilated. Pericardium: There  is no evidence of pericardial effusion. Mitral Valve: The mitral valve is normal in structure. Mild mitral valve regurgitation. No evidence of mitral valve stenosis. Tricuspid Valve: The tricuspid valve is normal in structure. Tricuspid valve regurgitation is mild . No evidence of tricuspid stenosis. Aortic Valve: The aortic valve is tricuspid. There is mild calcification of the aortic valve. There is mild thickening of the aortic valve. Aortic valve regurgitation is not visualized. Aortic valve sclerosis is present, with no evidence of aortic valve stenosis. Pulmonic Valve: The pulmonic valve was normal in structure. Pulmonic valve regurgitation is mild. No evidence of pulmonic stenosis. Aorta: Aortic dilatation noted. There is moderate dilatation of the ascending aorta, measuring 41 mm. Venous: The inferior vena cava is dilated in size with less than 50% respiratory variability, suggesting right atrial pressure of 15 mmHg. IAS/Shunts: No atrial level shunt detected by color flow Doppler.  LEFT VENTRICLE PLAX 2D LVIDd:         4.70 cm      Diastology LVIDs:         3.60 cm      LV e' medial:    5.66 cm/s LV PW:         1.70 cm      LV E/e' medial:  18.9 LV IVS:        2.15 cm      LV e' lateral:   7.29 cm/s LVOT diam:     2.20 cm      LV E/e' lateral: 14.7 LV SV:         79 LV SV Index:   41 LVOT Area:     3.80 cm  LV Volumes (MOD) LV vol d, MOD A2C: 244.0 ml LV vol d, MOD A4C: 187.5 ml LV vol s, MOD A2C: 133.0 ml LV vol s, MOD A4C: 111.0 ml LV SV MOD A2C:     111.0 ml LV SV MOD A4C:     187.5 ml LV SV MOD BP:      92.3 ml RIGHT VENTRICLE RV S prime:     5.00 cm/s TAPSE (M-mode): 1.6 cm LEFT ATRIUM             Index        RIGHT ATRIUM           Index LA diam:        4.50 cm 2.36 cm/m   RA Area:     23.10 cm  LA Vol (A2C):   87.3 ml 45.87 ml/m  RA Volume:   67.60 ml  35.52 ml/m LA Vol (A4C):   61.9 ml 32.52 ml/m LA Biplane Vol: 75.4 ml 39.62 ml/m  AORTIC VALVE             PULMONIC VALVE LVOT Vmax:   103.00  cm/s PR End Diast Vel: 8.88 msec LVOT Vmean:  76.600 cm/s LVOT VTI:    0.207 m  AORTA Ao Root diam: 2.90 cm Ao Asc diam:  4.05 cm MITRAL VALVE                TRICUSPID VALVE MV Area (PHT): 3.91 cm     TR Peak grad:   32.7 mmHg MV Decel Time: 194 msec     TR Vmax:        286.00 cm/s MV E velocity: 107.00 cm/s                             SHUNTS                             Systemic VTI:  0.21 m                             Systemic Diam: 2.20 cm Jenkins Rouge MD Electronically signed by Jenkins Rouge MD Signature Date/Time: 03/23/2022/2:24:17 PM    Final      Medications:     Scheduled Medications:  allopurinol  50 mg Oral Daily   apixaban  5 mg Oral BID   aspirin  81 mg Oral Daily   hydrALAZINE  100 mg Oral TID   insulin aspart  0-9 Units Subcutaneous TID WC   rosuvastatin  40 mg Oral Daily   Tafamidis  61 mg Oral Daily    Infusions:  furosemide 120 mg (03/23/22 1812)    PRN Medications: ondansetron **OR** ondansetron (ZOFRAN) IV    Patient Profile   76 y.o.  AAM  with w/ hx chronic pericardial effusion s/p pericardial window, cardiac amyloidosis, CAD, combined systolic CHF, prior PEA arrest, HTN, HLD, DM type 2, hx CVA, OSA, cognitive impairment, paroxysmal a fib and prostate cancer.   Admitted from Cardiology office with a/c CHF and AKI on CKD. AHF team asked to see for a/c CHF in setting of cardiac amyloidosis.    Assessment/Plan   Chronic systolic heart failure/cardiac amyloidosis  -Pericardial effusion s/p pericardial window with myocardial biopsy 05/22. Biopsy came back positive for amyloidosis, biopsy suggested AL. Serum and urine studies however were not consistent with AL amyloidosis. 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  - Limited echo 09/30/20: EF 40%, trace pericardial fluid post window, severe LVH, RV moderately reduced with severely increased wall thickness - Echo this admit: EF 30-35%, severe LVH, RV okay, RVSP 48 mmHg, dilated IVC - NYHA  class III on admission - Good UOP yesterday with high dose IV lasix + 5 mg metolazone. Scr slightly worse, 4.58>4.93. Volume improved but JVP still appears elevated. Discussed with Dr. Daniel Nones and Nephrology. Will arrange for RHC today to definitely assess volume status. Hold diuretics pending results. - Continue tafamidis  - Continue hydral 100 TID, previously on Imdur 60 daily (will not add back to allow BP for renal perfusion) - Limited benefit to GDMT in ATTR amyloid - UNNA boots  CAD  -  05/22 LHC for angina: 80% stenosis LAD noted. PCI aborted d/t marked volume overload and large pericardial effusion - HsTrop [id number] this admit, suspect demand ischemia. Denies angina. - Continue rosuvastatin, LDL 31 - Continue ASA with CAD    Paroxsymal atrial fibrillation/ atrial flutter - Found to be in a fib 8/23 - Appears to be in AFL with slow ventricular response, 50s. Hesitant to add amiodarone with bradycardia. Off beta blocker w/ history bradycardia. - Would ideally proceed with TEE/DCCV but no procedure availability. I personally checked this morning. - Continue Eliquis 5 BID   Pericardial effusion s/p pericardial window  -S/p pericardial window due to large pericardial effusion in 05/22.   AKI on CKD stage IV - recently had AV fistula placed - baseline Scr 3.3-3.9, Scr this admit 4.58>4.93 - Nephrology following - May need HD this admit      Length of Stay: [place], Corabelle Spackman N, PA-C  03/24/2022, 8:33 AM  Advanced Heart Failure Team Pager [phone] (M-F; 7a - 5p)  Please contact [facility] for night-coverage after hours (5p -7a ) and weekends on [email or web address]

## 2022-03-24 NOTE — Anesthesia Preprocedure Evaluation (Signed)
Anesthesia Evaluation  Patient identified by MRN, date of birth, ID band Patient awake    Reviewed: Allergy & Precautions, NPO status , Patient's Chart, lab work & pertinent test results  Airway Mallampati: I       Dental  (+) Edentulous Upper,    Pulmonary former smoker   Pulmonary exam normal        Cardiovascular hypertension, Pt. on medications + CAD, + Past MI and +CHF  + dysrhythmias Atrial Fibrillation  Rhythm:Irregular Rate:Normal     Neuro/Psych CVA, No Residual Symptoms  negative psych ROS   GI/Hepatic negative GI ROS, Neg liver ROS,,,  Endo/Other  diabetes    Renal/GU Renal InsufficiencyRenal diseaseCKD IV  negative genitourinary   Musculoskeletal   Abdominal Normal abdominal exam  (+)   Peds  Hematology  (+) Blood dyscrasia, anemia   Anesthesia Other Findings   1. Speckled appearance to LV myocardium consistent with diagnosis of  amyloid Global hypokinesis worse in the posterior lateral wall . Left  ventricular ejection fraction, by estimation, is 30 to 35%. The left  ventricle has moderately decreased function.  The left ventricle demonstrates global hypokinesis. There is severe left  ventricular hypertrophy. Left ventricular diastolic parameters are  indeterminate. Elevated left ventricular end-diastolic pressure.   2. Right ventricular systolic function is normal. The right ventricular  size is normal. There is moderately elevated pulmonary artery systolic  pressure.   3. Left atrial size was moderately dilated.   4. Right atrial size was moderately dilated.   5. The mitral valve is normal in structure. Mild mitral valve  regurgitation. No evidence of mitral stenosis.   6. The aortic valve is tricuspid. There is mild calcification of the  aortic valve. There is mild thickening of the aortic valve. Aortic valve  regurgitation is not visualized. Aortic valve sclerosis is present, with  no  evidence of aortic valve stenosis.   7. Aortic dilatation noted. There is moderate dilatation of the ascending  aorta, measuring 41 mm.   8. The inferior vena cava is dilated in size with <50% respiratory  variability, suggesting right atrial pressure of 15 mmHg.    SUMMARY  Severe single-vessel disease with heavily calcified mid LAD 80% stenosis  Acute Combined Systolic and Diastolic Heart Failure (systolic function appears to be reduced, to conserve contrast, LV gram not performed), but LVEDP 32 mmHg and PCWP 28 mmHg.  Systemic Hypertension with Mean Arterial Pressure 123 mmHg  Secondary Pulmonary Hypertension: PAP 67/19 mmHg-mean 39 mmHg with RVP-EDP of 67/2 mmHg-15 mmHg, RVP and RAP of 14 mmHg.  Moderate to severely reduced Cardiac Output-Index: 4.04-2.09     Reproductive/Obstetrics                             Anesthesia Physical Anesthesia Plan  ASA: 3  Anesthesia Plan:    Post-op Pain Management:    Induction: Intravenous  PONV Risk Score and Plan: Propofol infusion, TIVA and Treatment may vary due to age or medical condition  Airway Management Planned: Natural Airway and Mask  Additional Equipment: TEE  Intra-op Plan:   Post-operative Plan:   Informed Consent: I have reviewed the patients History and Physical, chart, labs and discussed the procedure including the risks, benefits and alternatives for the proposed anesthesia with the patient or authorized representative who has indicated his/her understanding and acceptance.       Plan Discussed with: CRNA  Anesthesia Plan Comments:  Anesthesia Quick Evaluation  

## 2022-03-24 NOTE — Progress Notes (Addendum)
[facility] Progress Note   76 y.o. male with HTN, HLD, DM, CVA, A-fib, prostate CA, amyloidosis, combined systolic and diastolic CHF (EF 52-84% w/ gr 2 diastolic dysfn), PEA arrest, chronic pericardial effusion status post pericardial window, CKD stage IV followed by Dr. Royce Macadamia at [facility] p/w worsening shortness of breath and DOE. Recent baseline serum creatinine level seems to be around 3.3-3.9 on torsemide 20 mg 3 times a week at home.  He underwent left brachial artery to cephalic vein AV fistula creation by Dr. Donzetta Matters on 01/31/2022. In the ED, Cr 4.58, CO2 19, BNP 1571, troponin 856, hemoglobin 11.6.  The chest x-ray consistent with diffuse bilateral pulmonary edema. Brisk response after Lasix 20 mg IV and a dose of metolazone with urine output 1800 documented.   Assessment/ Plan:   1) Acute on chronic CKD stage IV with fluid overload w/ CKD thought to be due to diabetes and hypertension with recent baseline serum creatinine level around 3.3-3.9 followed by Dr. Harrie Jeans.  Received IV Lasix in ER with urine output documented around 1.8 L.  He is currently on room air. Already 4557m / past 24hrs with worsening renal function.   Would like to try and avoid starting dialysis in the hospital unless absolutely necessary and the pt agrees. But if necessary he's willing to initiate hemodialysis during this admission.    No urgent need for HD today; possible RHC or TEE today and pt is NPO. Likely will need to decrease or hold the diuresis given worsening renal function. Has JVD but no peripheral edema outside of his left arm where the BCF is located, lungs are clear. Is he preload dependent?   2) Vascular dialysis access: Left upper extremity AV fistula is not mature yet; if he needs RRT will need a TC.   3) Acute on chronic combined CHF: Getting echocardiogram.  Diuresing with IV Lasix and metolazone.  Cardiology is following.   4) Metabolic acidosis: Start on oral sodium bicarbonate just  1 tab BID.   5) Anemia of CKD: Hemoglobin at goal.  Continue to monitor.   6) CKD-MBD: Calcium acceptable, phosphorus 6.7 -> Renvela 1 TIDM  Subjective:   4.5L UOP overnight.    Objective:   BP 119/81   Pulse (!) 131   Temp 97.7 F (36.5 C) (Oral)   Resp 17   Ht '5\' 6"'$  (1.676 m)   Wt 79.5 kg   SpO2 100%   BMI 28.29 kg/m   Intake/Output Summary (Last 24 hours) at 03/24/2022 0915 Last data filed at 03/24/2022 0449 Gross per 24 hour  Intake 168 ml  Output 4150 ml  Net -3982 ml   Weight change: -1.24 kg  Physical Exam: General exam: Appears calm and comfortable  Respiratory system: Basal crackles, no wheezing, Cardiovascular system: S1 & S2 heard, RRR.  Left leg edema looks chronic.. Gastrointestinal system: Abdomen is nondistended, soft and nontender. Normal bowel sounds heard. Central nervous system: Alert and oriented. No focal neurological deficits. Extremities: Symmetric 5 x 5 power.  Edema in the left leg. Skin: No rashes, lesions or ulcers Psychiatry: Judgement and insight appear normal. Mood & affect appropriate.  Vascular dialysis access: Left upper extremity BCF, good thrill and bruit.    Imaging: CT HEAD WO CONTRAST (5MM)  Result Date: 03/23/2022 CLINICAL DATA:  Acute neuro deficit. Slurred speech. Patient on dialysis. EXAM: CT HEAD WITHOUT CONTRAST TECHNIQUE: Contiguous axial images were obtained from the base of the skull through the vertex without intravenous contrast. RADIATION  DOSE REDUCTION: This exam was performed according to the departmental dose-optimization program which includes automated exposure control, adjustment of the mA and/or kV according to patient size and/or use of iterative reconstruction technique. COMPARISON:  CT head 09/19/2012.  MRI head with contrast 01/07/2015 FINDINGS: Brain: Extensive and diffuse dural ossification involving the falx and the dura around the cerebral hemispheres bilaterally. There is also involvement of the tentorium.  This shows mild progression since 2008. There is diffuse dural enhancement on the prior MRI. No brain edema identified. Mild atrophy. Ventricle size normal. Mild white matter hypodensity bilaterally. Negative for acute infarct or hemorrhage. Vascular: Negative for hyperdense vessel Skull: No focal skeletal lesion. Sinuses/Orbits: Paranasal sinuses clear.  Left cataract extraction Other: None IMPRESSION: 1. No acute intracranial abnormality. Mild atrophy and mild chronic microvascular ischemic change in the white matter. 2. Extensive and diffuse dural ossification. This has progressed since 2008. Consider metabolic disorder. Correlate with serum calcium and parathyroid hormone level. Diffuse meningioma also possible. Recommend follow-up MRI head without and with contrast for comparison with 2016. Electronically Signed   By: Franchot Gallo M.D.   On: 03/23/2022 15:14   ECHOCARDIOGRAM COMPLETE  Result Date: 03/23/2022    ECHOCARDIOGRAM REPORT   Patient Name:   Corey [name] Date of Exam: 03/23/2022 Medical Rec #:  [id number]    Height:       66.0 in Accession #:    [id number]   Weight:       178.0 lb Date of Birth:  [birth year]    BSA:          1.903 m Patient Age:    37 years     BP:           136/122 mmHg Patient Gender: M            HR:           48 bpm. Exam Location:  Inpatient Procedure: 2D Echo, Cardiac Doppler, Color Doppler and Intracardiac            Opacification Agent Indications:    CHF-Acute Systolic I94.85  History:        Patient has prior history of Echocardiogram examinations, most                 recent 09/30/2020. CAD; Arrythmias:Bradycardia with first-degree                 block. Chronic systolic heart failure/cardiac amyloidosis.                 History of pericardial effusion s/p pericardial window.  Sonographer:    Darlina Sicilian RDCS Referring Phys: [place]  1. Speckled appearance to LV myocardium consistent with diagnosis of amyloid Global hypokinesis worse in the posterior  lateral wall . Left ventricular ejection fraction, by estimation, is 30 to 35%. The left ventricle has moderately decreased function. The left ventricle demonstrates global hypokinesis. There is severe left ventricular hypertrophy. Left ventricular diastolic parameters are indeterminate. Elevated left ventricular end-diastolic pressure.  2. Right ventricular systolic function is normal. The right ventricular size is normal. There is moderately elevated pulmonary artery systolic pressure.  3. Left atrial size was moderately dilated.  4. Right atrial size was moderately dilated.  5. The mitral valve is normal in structure. Mild mitral valve regurgitation. No evidence of mitral stenosis.  6. The aortic valve is tricuspid. There is mild calcification of the aortic valve. There is mild thickening of the aortic valve. Aortic valve  regurgitation is not visualized. Aortic valve sclerosis is present, with no evidence of aortic valve stenosis.  7. Aortic dilatation noted. There is moderate dilatation of the ascending aorta, measuring 41 mm.  8. The inferior vena cava is dilated in size with <50% respiratory variability, suggesting right atrial pressure of 15 mmHg. FINDINGS  Left Ventricle: Speckled appearance to LV myocardium consistent with diagnosis of amyloid Global hypokinesis worse in the posterior lateral wall. Left ventricular ejection fraction, by estimation, is 30 to 35%. The left ventricle has moderately decreased function. The left ventricle demonstrates global hypokinesis. Definity contrast agent was given IV to delineate the left ventricular endocardial borders. The left ventricular internal cavity size was normal in size. There is severe left ventricular hypertrophy. Left ventricular diastolic parameters are indeterminate. Elevated left ventricular end-diastolic pressure. Right Ventricle: The right ventricular size is normal. No increase in right ventricular wall thickness. Right ventricular systolic function is  normal. There is moderately elevated pulmonary artery systolic pressure. The tricuspid regurgitant velocity is 2.86 m/s, and with an assumed right atrial pressure of 15 mmHg, the estimated right ventricular systolic pressure is 67.6 mmHg. Left Atrium: Left atrial size was moderately dilated. Right Atrium: Right atrial size was moderately dilated. Pericardium: There is no evidence of pericardial effusion. Mitral Valve: The mitral valve is normal in structure. Mild mitral valve regurgitation. No evidence of mitral valve stenosis. Tricuspid Valve: The tricuspid valve is normal in structure. Tricuspid valve regurgitation is mild . No evidence of tricuspid stenosis. Aortic Valve: The aortic valve is tricuspid. There is mild calcification of the aortic valve. There is mild thickening of the aortic valve. Aortic valve regurgitation is not visualized. Aortic valve sclerosis is present, with no evidence of aortic valve stenosis. Pulmonic Valve: The pulmonic valve was normal in structure. Pulmonic valve regurgitation is mild. No evidence of pulmonic stenosis. Aorta: Aortic dilatation noted. There is moderate dilatation of the ascending aorta, measuring 41 mm. Venous: The inferior vena cava is dilated in size with less than 50% respiratory variability, suggesting right atrial pressure of 15 mmHg. IAS/Shunts: No atrial level shunt detected by color flow Doppler.  LEFT VENTRICLE PLAX 2D LVIDd:         4.70 cm      Diastology LVIDs:         3.60 cm      LV e' medial:    5.66 cm/s LV PW:         1.70 cm      LV E/e' medial:  18.9 LV IVS:        2.15 cm      LV e' lateral:   7.29 cm/s LVOT diam:     2.20 cm      LV E/e' lateral: 14.7 LV SV:         79 LV SV Index:   41 LVOT Area:     3.80 cm  LV Volumes (MOD) LV vol d, MOD A2C: 244.0 ml LV vol d, MOD A4C: 187.5 ml LV vol s, MOD A2C: 133.0 ml LV vol s, MOD A4C: 111.0 ml LV SV MOD A2C:     111.0 ml LV SV MOD A4C:     187.5 ml LV SV MOD BP:      92.3 ml RIGHT VENTRICLE RV S prime:      5.00 cm/s TAPSE (M-mode): 1.6 cm LEFT ATRIUM             Index        RIGHT ATRIUM  Index LA diam:        4.50 cm 2.36 cm/m   RA Area:     23.10 cm LA Vol (A2C):   87.3 ml 45.87 ml/m  RA Volume:   67.60 ml  35.52 ml/m LA Vol (A4C):   61.9 ml 32.52 ml/m LA Biplane Vol: 75.4 ml 39.62 ml/m  AORTIC VALVE             PULMONIC VALVE LVOT Vmax:   103.00 cm/s PR End Diast Vel: 8.88 msec LVOT Vmean:  76.600 cm/s LVOT VTI:    0.207 m  AORTA Ao Root diam: 2.90 cm Ao Asc diam:  4.05 cm MITRAL VALVE                TRICUSPID VALVE MV Area (PHT): 3.91 cm     TR Peak grad:   32.7 mmHg MV Decel Time: 194 msec     TR Vmax:        286.00 cm/s MV E velocity: 107.00 cm/s                             SHUNTS                             Systemic VTI:  0.21 m                             Systemic Diam: 2.20 cm Jenkins Rouge MD Electronically signed by Jenkins Rouge MD Signature Date/Time: 03/23/2022/2:24:17 PM    Final    DG Chest 2 View  Result Date: 03/22/2022 CLINICAL DATA:  Shortness of breath EXAM: CHEST - 2 VIEW COMPARISON:  01/05/2022 FINDINGS: Gross cardiomegaly. Diffuse bilateral interstitial pulmonary opacity. The visualized skeletal structures are unremarkable. IMPRESSION: Gross cardiomegaly with diffuse bilateral interstitial pulmonary opacity, likely edema. No focal airspace opacity. Electronically Signed   By: Delanna Ahmadi M.D.   On: 03/22/2022 16:56    Labs: BMET Recent Labs  Lab 03/22/22 1659 03/24/22 0032  NA 143 140  K 3.8 3.7  CL 113* 106  CO2 19* 21*  GLUCOSE 78 113*  BUN 70* 76*  CREATININE 4.58* 4.93*  CALCIUM 9.6 9.3  PHOS  --  6.7*   CBC Recent Labs  Lab 03/22/22 1659 03/24/22 0032  WBC 2.7* 2.7*  NEUTROABS 1.6*  --   HGB 11.6* 11.2*  HCT 36.4* 35.5*  MCV 87.7 87.7  PLT 103* 117*    Medications:     allopurinol  50 mg Oral Daily   apixaban  5 mg Oral BID   aspirin  81 mg Oral Daily   hydrALAZINE  100 mg Oral TID   insulin aspart  0-9 Units Subcutaneous TID WC    rosuvastatin  40 mg Oral Daily   Tafamidis  61 mg Oral Daily      Otelia Santee, MD 03/24/2022, 9:15 AM

## 2022-03-24 NOTE — Anesthesia Procedure Notes (Signed)
Procedure Name: MAC Date/Time: 03/24/2022 1:05 PM  Performed by: Mariea Clonts, CRNAPre-anesthesia Checklist: Patient identified, Emergency Drugs available, Suction available, Patient being monitored and Timeout performed Patient Re-evaluated:Patient Re-evaluated prior to induction Oxygen Delivery Method: Simple face mask and Nasal cannula

## 2022-03-24 NOTE — Progress Notes (Addendum)
Advanced Heart Failure Rounding Note  PCP-Cardiologist: Candee Furbish, MD   Subjective:     4.6L UOP yesterday with total 120 IV lasix TID + 5 mg metolazone. ? Weight unchanged.  Scr 4.58>4.93 BUN 70>76  Dyspnea slightly improved. Easily winded with light activity.     Objective:   Weight Range: 79.5 kg Body mass index is 28.29 kg/m.   Vital Signs:   Temp:  [97.5 F (36.4 C)-98 F (36.7 C)] 97.6 F (36.4 C) (11/10 0449) Pulse Rate:  [48-131] 131 (11/10 0449) Resp:  [19-24] 19 (11/10 0449) BP: (112-133)/(67-79) 128/79 (11/10 0449) SpO2:  [91 %-100 %] 99 % (11/10 0449) Weight:  [79.5 kg] 79.5 kg (11/10 0449) Last BM Date : 03/22/22  Weight change: Filed Weights   03/22/22 1556 03/23/22 1518 03/24/22 0449  Weight: 80.7 kg 79.5 kg 79.5 kg    Intake/Output:   Intake/Output Summary (Last 24 hours) at 03/24/2022 0833 Last data filed at 03/24/2022 0449 Gross per 24 hour  Intake 168 ml  Output 4150 ml  Net -3982 ml      Physical Exam    General:  Comfortable at rest HEENT: Normal Neck: Supple. JVP 12 cm.  Carotids 2+ bilat; no bruits.  Cor: PMI nondisplaced. Irregular rhythm, brady. No rubs, gallops or murmurs. Lungs: Clear Abdomen: Soft, nontender, nondistended.  Extremities: No cyanosis, clubbing, rash, 1+ edema, + UNNA Neuro: Alert & orientedx3, cranial nerves grossly intact. moves all 4 extremities w/o difficulty. Affect pleasant   Telemetry   Atrial flutter 50s   Labs    CBC Recent Labs    03/22/22 1659 03/24/22 0032  WBC 2.7* 2.7*  NEUTROABS 1.6*  --   HGB 11.6* 11.2*  HCT 36.4* 35.5*  MCV 87.7 87.7  PLT 103* 850*   Basic Metabolic Panel Recent Labs    03/22/22 1659 03/24/22 0032  NA 143 140  K 3.8 3.7  CL 113* 106  CO2 19* 21*  GLUCOSE 78 113*  BUN 70* 76*  CREATININE 4.58* 4.93*  CALCIUM 9.6 9.3  PHOS  --  6.7*   Liver Function Tests No results for input(s): "AST", "ALT", "ALKPHOS", "BILITOT", "PROT", "ALBUMIN" in  the last 72 hours. No results for input(s): "LIPASE", "AMYLASE" in the last 72 hours. Cardiac Enzymes No results for input(s): "CKTOTAL", "CKMB", "CKMBINDEX", "TROPONINI" in the last 72 hours.  BNP: BNP (last 3 results) Recent Labs    03/22/22 1659  BNP 1,571.3*    ProBNP (last 3 results) Recent Labs    01/05/22 1125  PROBNP 1,740.0*     D-Dimer No results for input(s): "DDIMER" in the last 72 hours. Hemoglobin A1C Recent Labs    03/23/22 0630  HGBA1C 5.7*   Fasting Lipid Panel Recent Labs    03/23/22 1615  CHOL 86  HDL 44  LDLCALC 31  TRIG 54  CHOLHDL 2.0   Thyroid Function Tests No results for input(s): "TSH", "T4TOTAL", "T3FREE", "THYROIDAB" in the last 72 hours.  Invalid input(s): "FREET3"  Other results:   Imaging    CT HEAD WO CONTRAST (5MM)  Result Date: 03/23/2022 CLINICAL DATA:  Acute neuro deficit. Slurred speech. Patient on dialysis. EXAM: CT HEAD WITHOUT CONTRAST TECHNIQUE: Contiguous axial images were obtained from the base of the skull through the vertex without intravenous contrast. RADIATION DOSE REDUCTION: This exam was performed according to the departmental dose-optimization program which includes automated exposure control, adjustment of the mA and/or kV according to patient size and/or use of iterative reconstruction technique.  COMPARISON:  CT head 09/19/2012.  MRI head with contrast 01/07/2015 FINDINGS: Brain: Extensive and diffuse dural ossification involving the falx and the dura around the cerebral hemispheres bilaterally. There is also involvement of the tentorium. This shows mild progression since 2008. There is diffuse dural enhancement on the prior MRI. No brain edema identified. Mild atrophy. Ventricle size normal. Mild white matter hypodensity bilaterally. Negative for acute infarct or hemorrhage. Vascular: Negative for hyperdense vessel Skull: No focal skeletal lesion. Sinuses/Orbits: Paranasal sinuses clear.  Left cataract  extraction Other: None IMPRESSION: 1. No acute intracranial abnormality. Mild atrophy and mild chronic microvascular ischemic change in the white matter. 2. Extensive and diffuse dural ossification. This has progressed since 2008. Consider metabolic disorder. Correlate with serum calcium and parathyroid hormone level. Diffuse meningioma also possible. Recommend follow-up MRI head without and with contrast for comparison with 2016. Electronically Signed   By: Franchot Gallo M.D.   On: 03/23/2022 15:14   ECHOCARDIOGRAM COMPLETE  Result Date: 03/23/2022    ECHOCARDIOGRAM REPORT   Patient Name:   Corey [name] Date of Exam: 03/23/2022 Medical Rec #:  [id number]    Height:       66.0 in Accession #:    [id number]   Weight:       178.0 lb Date of Birth:  1945-06-20    BSA:          1.903 m Patient Age:    76 years     BP:           136/122 mmHg Patient Gender: M            HR:           48 bpm. Exam Location:  Inpatient Procedure: 2D Echo, Cardiac Doppler, Color Doppler and Intracardiac            Opacification Agent Indications:    CHF-Acute Systolic G40.10  History:        Patient has prior history of Echocardiogram examinations, most                 recent 09/30/2020. CAD; Arrythmias:Bradycardia with first-degree                 block. Chronic systolic heart failure/cardiac amyloidosis.                 History of pericardial effusion s/p pericardial window.  Sonographer:    Darlina Sicilian RDCS Referring Phys: [place]  1. Speckled appearance to LV myocardium consistent with diagnosis of amyloid Global hypokinesis worse in the posterior lateral wall . Left ventricular ejection fraction, by estimation, is 30 to 35%. The left ventricle has moderately decreased function. The left ventricle demonstrates global hypokinesis. There is severe left ventricular hypertrophy. Left ventricular diastolic parameters are indeterminate. Elevated left ventricular end-diastolic pressure.  2. Right ventricular systolic  function is normal. The right ventricular size is normal. There is moderately elevated pulmonary artery systolic pressure.  3. Left atrial size was moderately dilated.  4. Right atrial size was moderately dilated.  5. The mitral valve is normal in structure. Mild mitral valve regurgitation. No evidence of mitral stenosis.  6. The aortic valve is tricuspid. There is mild calcification of the aortic valve. There is mild thickening of the aortic valve. Aortic valve regurgitation is not visualized. Aortic valve sclerosis is present, with no evidence of aortic valve stenosis.  7. Aortic dilatation noted. There is moderate dilatation of the ascending aorta, measuring 41 mm.  8. The inferior vena cava is dilated in size with <50% respiratory variability, suggesting right atrial pressure of 15 mmHg. FINDINGS  Left Ventricle: Speckled appearance to LV myocardium consistent with diagnosis of amyloid Global hypokinesis worse in the posterior lateral wall. Left ventricular ejection fraction, by estimation, is 30 to 35%. The left ventricle has moderately decreased function. The left ventricle demonstrates global hypokinesis. Definity contrast agent was given IV to delineate the left ventricular endocardial borders. The left ventricular internal cavity size was normal in size. There is severe left ventricular hypertrophy. Left ventricular diastolic parameters are indeterminate. Elevated left ventricular end-diastolic pressure. Right Ventricle: The right ventricular size is normal. No increase in right ventricular wall thickness. Right ventricular systolic function is normal. There is moderately elevated pulmonary artery systolic pressure. The tricuspid regurgitant velocity is 2.86 m/s, and with an assumed right atrial pressure of 15 mmHg, the estimated right ventricular systolic pressure is 65.7 mmHg. Left Atrium: Left atrial size was moderately dilated. Right Atrium: Right atrial size was moderately dilated. Pericardium: There  is no evidence of pericardial effusion. Mitral Valve: The mitral valve is normal in structure. Mild mitral valve regurgitation. No evidence of mitral valve stenosis. Tricuspid Valve: The tricuspid valve is normal in structure. Tricuspid valve regurgitation is mild . No evidence of tricuspid stenosis. Aortic Valve: The aortic valve is tricuspid. There is mild calcification of the aortic valve. There is mild thickening of the aortic valve. Aortic valve regurgitation is not visualized. Aortic valve sclerosis is present, with no evidence of aortic valve stenosis. Pulmonic Valve: The pulmonic valve was normal in structure. Pulmonic valve regurgitation is mild. No evidence of pulmonic stenosis. Aorta: Aortic dilatation noted. There is moderate dilatation of the ascending aorta, measuring 41 mm. Venous: The inferior vena cava is dilated in size with less than 50% respiratory variability, suggesting right atrial pressure of 15 mmHg. IAS/Shunts: No atrial level shunt detected by color flow Doppler.  LEFT VENTRICLE PLAX 2D LVIDd:         4.70 cm      Diastology LVIDs:         3.60 cm      LV e' medial:    5.66 cm/s LV PW:         1.70 cm      LV E/e' medial:  18.9 LV IVS:        2.15 cm      LV e' lateral:   7.29 cm/s LVOT diam:     2.20 cm      LV E/e' lateral: 14.7 LV SV:         79 LV SV Index:   41 LVOT Area:     3.80 cm  LV Volumes (MOD) LV vol d, MOD A2C: 244.0 ml LV vol d, MOD A4C: 187.5 ml LV vol s, MOD A2C: 133.0 ml LV vol s, MOD A4C: 111.0 ml LV SV MOD A2C:     111.0 ml LV SV MOD A4C:     187.5 ml LV SV MOD BP:      92.3 ml RIGHT VENTRICLE RV S prime:     5.00 cm/s TAPSE (M-mode): 1.6 cm LEFT ATRIUM             Index        RIGHT ATRIUM           Index LA diam:        4.50 cm 2.36 cm/m   RA Area:     23.10 cm  LA Vol (A2C):   87.3 ml 45.87 ml/m  RA Volume:   67.60 ml  35.52 ml/m LA Vol (A4C):   61.9 ml 32.52 ml/m LA Biplane Vol: 75.4 ml 39.62 ml/m  AORTIC VALVE             PULMONIC VALVE LVOT Vmax:   103.00  cm/s PR End Diast Vel: 8.88 msec LVOT Vmean:  76.600 cm/s LVOT VTI:    0.207 m  AORTA Ao Root diam: 2.90 cm Ao Asc diam:  4.05 cm MITRAL VALVE                TRICUSPID VALVE MV Area (PHT): 3.91 cm     TR Peak grad:   32.7 mmHg MV Decel Time: 194 msec     TR Vmax:        286.00 cm/s MV E velocity: 107.00 cm/s                             SHUNTS                             Systemic VTI:  0.21 m                             Systemic Diam: 2.20 cm Jenkins Rouge MD Electronically signed by Jenkins Rouge MD Signature Date/Time: 03/23/2022/2:24:17 PM    Final      Medications:     Scheduled Medications:  allopurinol  50 mg Oral Daily   apixaban  5 mg Oral BID   aspirin  81 mg Oral Daily   hydrALAZINE  100 mg Oral TID   insulin aspart  0-9 Units Subcutaneous TID WC   rosuvastatin  40 mg Oral Daily   Tafamidis  61 mg Oral Daily    Infusions:  furosemide 120 mg (03/23/22 1812)    PRN Medications: ondansetron **OR** ondansetron (ZOFRAN) IV    Patient Profile   76 y.o.  AAM  with w/ hx chronic pericardial effusion s/p pericardial window, cardiac amyloidosis, CAD, combined systolic CHF, prior PEA arrest, HTN, HLD, DM type 2, hx CVA, OSA, cognitive impairment, paroxysmal a fib and prostate cancer.   Admitted from Cardiology office with a/c CHF and AKI on CKD. AHF team asked to see for a/c CHF in setting of cardiac amyloidosis.    Assessment/Plan   Chronic systolic heart failure/cardiac amyloidosis  -Pericardial effusion s/p pericardial window with myocardial biopsy 05/22. Biopsy came back positive for amyloidosis, biopsy suggested AL. Serum and urine studies however were not consistent with AL amyloidosis. 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  - Limited echo 09/30/20: EF 40%, trace pericardial fluid post window, severe LVH, RV moderately reduced with severely increased wall thickness - Echo this admit: EF 30-35%, severe LVH, RV okay, RVSP 48 mmHg, dilated IVC - NYHA  class III on admission - Good UOP yesterday with high dose IV lasix + 5 mg metolazone. Scr slightly worse, 4.58>4.93. Volume improved but JVP still appears elevated. Discussed with Dr. Daniel Nones and Nephrology. Will arrange for RHC today to definitely assess volume status. Hold diuretics pending results. - Continue tafamidis  - Continue hydral 100 TID, previously on Imdur 60 daily (will not add back to allow BP for renal perfusion) - Limited benefit to GDMT in ATTR amyloid - UNNA boots  CAD  -  05/22 LHC for angina: 80% stenosis LAD noted. PCI aborted d/t marked volume overload and large pericardial effusion - [facility] [phone] this admit, suspect demand ischemia. Denies angina. - Continue rosuvastatin, LDL 31 - Continue ASA with CAD    Paroxsymal atrial fibrillation/ atrial flutter - Found to be in a fib 8/23 - Appears to be in AFL with slow ventricular response, 50s. Hesitant to add amiodarone with bradycardia. Off beta blocker w/ history bradycardia. - Would ideally proceed with TEE/DCCV but no procedure availability. I personally checked this morning. - Continue Eliquis 5 BID   Pericardial effusion s/p pericardial window  -S/p pericardial window due to large pericardial effusion in 05/22.   AKI on CKD stage IV - recently had AV fistula placed - baseline Scr 3.3-3.9, Scr this admit 4.58>4.93 - Nephrology following - May need HD this admit      Length of Stay: [place], Terrilyn Tyner N, PA-C  03/24/2022, 8:33 AM  Advanced Heart Failure Team Pager [phone] (M-F; 7a - 5p)  Please contact [facility] for night-coverage after hours (5p -7a ) and weekends on [email or web address]

## 2022-03-24 NOTE — Progress Notes (Signed)
  Echocardiogram Echocardiogram Transesophageal has been performed.  Corey [name] 03/24/2022, 1:41 PM

## 2022-03-24 NOTE — Interval H&P Note (Signed)
History and Physical Interval Note:  03/24/2022 2:12 PM  Corey [name]  has presented today for surgery, with the diagnosis of heart failure.  The various methods of treatment have been discussed with the patient and family. After consideration of risks, benefits and other options for treatment, the patient has consented to  Procedure(s): RIGHT HEART CATH (N/A) as a surgical intervention.  The patient's history has been reviewed, patient examined, no change in status, stable for surgery.  I have reviewed the patient's chart and labs.  Questions were answered to the patient's satisfaction.     [name]

## 2022-03-24 NOTE — Anesthesia Postprocedure Evaluation (Signed)
Anesthesia Post Note  Patient: Corey [name]  Procedure(s) Performed: TRANSESOPHAGEAL ECHOCARDIOGRAM (TEE) CARDIOVERSION     Patient location during evaluation: PACU Anesthesia Type: General Level of consciousness: awake and alert Pain management: pain level controlled Vital Signs Assessment: post-procedure vital signs reviewed and stable Respiratory status: spontaneous breathing, nonlabored ventilation and respiratory function stable Cardiovascular status: blood pressure returned to baseline and stable Postop Assessment: no apparent nausea or vomiting Anesthetic complications: no   No notable events documented.  Last Vitals:  Vitals:   03/24/22 1428 03/24/22 1450  BP: (!) 121/58 122/78  Pulse: (!) 51 (!) 59  Resp: 15 17  Temp:    SpO2: 96% 100%    Last Pain:  Vitals:   03/24/22 1450  TempSrc:   PainSc: 0-No pain                 Audry Pili

## 2022-03-24 NOTE — Transfer of Care (Signed)
Immediate Anesthesia Transfer of Care Note  Patient: Corey [name]  Procedure(s) Performed: TRANSESOPHAGEAL ECHOCARDIOGRAM (TEE) CARDIOVERSION  Patient Location: Endoscopy Unit  Anesthesia Type:MAC  Level of Consciousness: awake, alert , and oriented  Airway & Oxygen Therapy: Patient Spontanous Breathing and Patient connected to nasal cannula oxygen  Post-op Assessment: Report given to RN and Post -op Vital signs reviewed and stable  Post vital signs: Reviewed and stable  Last Vitals:  Vitals Value Taken Time  BP    Temp    Pulse 51 03/24/22 1338  Resp 18 03/24/22 1338  SpO2 98 % 03/24/22 1338  Vitals shown include unvalidated device data.  Last Pain:  Vitals:   03/24/22 1135  TempSrc: Temporal  PainSc: 0-No pain         Complications: No notable events documented.

## 2022-03-24 NOTE — Progress Notes (Signed)
PROGRESS NOTE    Corey [name]  PXT:[id number] DOB: Oct 04, 1945 DOA: 03/22/2022 PCP: [name], MD  75/M with history of atrial fibrillation on Eliquis, history of CVA, chronic systolic and diastolic CHF, EF 62-70%, cardiac amyloidosis, CKD 4-5, status post creation of left arm AV fistula on 9/19, chronic right leg wound followed at the wound center presented to the ED from cardiology clinic with worsening dyspnea on exertion, orthopnea, edema. -In the ED BNP was 1571, chest x-ray noted bilateral interstitial edema, BUN was 70, creatinine 4.5, EKG noted atrial flutter  Subjective: Feels better, breathing is improving  Assessment and Plan:  Acute on chronic combined systolic and diastolic heart failure  Known cardiac amyloidosis, positive genetic testing for ATTR amyloid -diuresed well w/ IV lasix -repea ECHO w/ EF 30-35%, severe LVH -appreciate Renal input, has a left arm AV fistula placed 9/19 -Depending on clinical course may need to start HD this admission, -plan for RHC -BMP in a.m.   Elevated troponin -could be due to demand, do not suspect ACS   AKI on chronic kidney disease (CKD), stage IV (severe) (HCC) Pt has left UE AVF that was placed in 01/31/2022. -Creatinine worse than baseline, 4.9 now, baseline around 3.5, possibly cardiorenal, holding diuretics today, plan for RHC -nephrology consult appreciated -Diuretics as noted above   Paroxysmal atrial fibrillation ([place]) Currently in atrial flutter.  -Rate controlled. Continue with Eliquis.   Right lower leg chronic wound -Managed at the wound center and with home health RN, changed 11/9 by [place] team   Hereditary cardiac amyloidosis ([place]) On tafamidis.    S/P pericardial window creation -no effusion noted now   Diabetes (HCC) Stable.,  A1c is 5.7, continue sliding scale insulin   Essential hypertension Stable.    DVT prophylaxis: Eliquis Code Status: Full Code Family Communication: discussed with pt and wife  [name] yesterday Disposition Plan:   Consultants:    Procedures:   Antimicrobials:    Objective: Vitals:   03/24/22 0847 03/24/22 1135 03/24/22 1338 03/24/22 1345  BP: 119/81 127/76 97/68 108/71  Pulse:  (!) 49 (!) 58 (!) 49  Resp: '17 13 18 17  '$ Temp: 97.7 F (36.5 C) 97.6 F (36.4 C) (!) 97.5 F (36.4 C)   TempSrc: Oral Temporal Temporal   SpO2: 100% 98% 98% 97%  Weight: 76 kg     Height:        Intake/Output Summary (Last 24 hours) at 03/24/2022 1404 Last data filed at 03/24/2022 1327 Gross per 24 hour  Intake 585.64 ml  Output 4200 ml  Net -3614.36 ml   Filed Weights   03/23/22 1518 03/24/22 0449 03/24/22 0847  Weight: 79.5 kg 79.5 kg 76 kg    Examination:  General exam: Appears calm and comfortable  Respiratory system: basilar rales Cardiovascular system: S1 & S2 heard, RRR.  Abd: nondistended, soft and nontender.Normal bowel sounds heard. Central nervous system: Alert and oriented. No focal neurological deficits. Extremities: 1plus edema, UNNA boots Skin: No rashes Psychiatry:  Mood & affect appropriate.     Data Reviewed:   CBC: Recent Labs  Lab 03/22/22 1659 03/24/22 0032  WBC 2.7* 2.7*  NEUTROABS 1.6*  --   HGB 11.6* 11.2*  HCT 36.4* 35.5*  MCV 87.7 87.7  PLT 103* 350*   Basic Metabolic Panel: Recent Labs  Lab 03/22/22 1659 03/24/22 0032  NA 143 140  K 3.8 3.7  CL 113* 106  CO2 19* 21*  GLUCOSE 78 113*  BUN 70* 76*  CREATININE 4.58* 4.93*  CALCIUM 9.6 9.3  PHOS  --  6.7*   GFR: Estimated Creatinine Clearance: 12 mL/min (A) (by C-G formula based on SCr of 4.93 mg/dL (H)). Liver Function Tests: No results for input(s): "AST", "ALT", "ALKPHOS", "BILITOT", "PROT", "ALBUMIN" in the last 168 hours. No results for input(s): "LIPASE", "AMYLASE" in the last 168 hours. No results for input(s): "AMMONIA" in the last 168 hours. Coagulation Profile: No results for input(s): "INR", "PROTIME" in the last 168 hours. Cardiac  Enzymes: No results for input(s): "CKTOTAL", "CKMB", "CKMBINDEX", "TROPONINI" in the last 168 hours. BNP (last 3 results) Recent Labs    01/05/22 1125  PROBNP 1,740.0*   HbA1C: Recent Labs    03/23/22 0630  HGBA1C 5.7*   CBG: Recent Labs  Lab 03/23/22 0911 03/23/22 1236 03/23/22 1700 03/24/22 0644  GLUCAP 125* 85 102* 89   Lipid Profile: Recent Labs    03/23/22 1615  CHOL 86  HDL 44  LDLCALC 31  TRIG 54  CHOLHDL 2.0   Thyroid Function Tests: No results for input(s): "TSH", "T4TOTAL", "FREET4", "T3FREE", "THYROIDAB" in the last 72 hours. Anemia Panel: Recent Labs    03/23/22 1615  VITAMINB12 848  FOLATE 14.1  FERRITIN 95  TIBC 272  IRON 52  RETICCTPCT 0.7   Urine analysis:    Component Value Date/Time   COLORURINE YELLOW 09/25/2020 2131   APPEARANCEUR CLEAR 09/25/2020 2131   LABSPEC 1.022 09/25/2020 2131   PHURINE 5.0 09/25/2020 2131   GLUCOSEU >=500 (A) 09/25/2020 2131   GLUCOSEU NEGATIVE 07/20/2014 1055   HGBUR NEGATIVE 09/25/2020 2131   HGBUR negative 08/28/2008 1446   BILIRUBINUR NEGATIVE 09/25/2020 2131   BILIRUBINUR Neg 10/28/2014 0916   KETONESUR NEGATIVE 09/25/2020 2131   PROTEINUR NEGATIVE 09/25/2020 2131   UROBILINOGEN 0.2 10/28/2014 0916   UROBILINOGEN 0.2 07/20/2014 1055   NITRITE NEGATIVE 09/25/2020 2131   LEUKOCYTESUR SMALL (A) 09/25/2020 2131   Sepsis Labs: '@LABRCNTIP'$ (procalcitonin:4,lacticidven:4)  )No results found for this or any previous visit (from the past 240 hour(s)).   Radiology Studies: CT HEAD WO CONTRAST (5MM)  Result Date: 03/23/2022 CLINICAL DATA:  Acute neuro deficit. Slurred speech. Patient on dialysis. EXAM: CT HEAD WITHOUT CONTRAST TECHNIQUE: Contiguous axial images were obtained from the base of the skull through the vertex without intravenous contrast. RADIATION DOSE REDUCTION: This exam was performed according to the departmental dose-optimization program which includes automated exposure control, adjustment  of the mA and/or kV according to patient size and/or use of iterative reconstruction technique. COMPARISON:  CT head 09/19/2012.  MRI head with contrast 01/07/2015 FINDINGS: Brain: Extensive and diffuse dural ossification involving the falx and the dura around the cerebral hemispheres bilaterally. There is also involvement of the tentorium. This shows mild progression since 2008. There is diffuse dural enhancement on the prior MRI. No brain edema identified. Mild atrophy. Ventricle size normal. Mild white matter hypodensity bilaterally. Negative for acute infarct or hemorrhage. Vascular: Negative for hyperdense vessel Skull: No focal skeletal lesion. Sinuses/Orbits: Paranasal sinuses clear.  Left cataract extraction Other: None IMPRESSION: 1. No acute intracranial abnormality. Mild atrophy and mild chronic microvascular ischemic change in the white matter. 2. Extensive and diffuse dural ossification. This has progressed since 2008. Consider metabolic disorder. Correlate with serum calcium and parathyroid hormone level. Diffuse meningioma also possible. Recommend follow-up MRI head without and with contrast for comparison with 2016. Electronically Signed   By: Franchot Gallo M.D.   On: 03/23/2022 15:14   ECHOCARDIOGRAM COMPLETE  Result Date: 03/23/2022  ECHOCARDIOGRAM REPORT   Patient Name:   JOVANNI RASH Date of Exam: 03/23/2022 Medical Rec #:  [id number]    Height:       66.0 in Accession #:    [id number]   Weight:       178.0 lb Date of Birth:  1946/02/08    BSA:          1.903 m Patient Age:    81 years     BP:           136/122 mmHg Patient Gender: M            HR:           48 bpm. Exam Location:  Inpatient Procedure: 2D Echo, Cardiac Doppler, Color Doppler and Intracardiac            Opacification Agent Indications:    CHF-Acute Systolic Q59.56  History:        Patient has prior history of Echocardiogram examinations, most                 recent 09/30/2020. CAD; Arrythmias:Bradycardia with first-degree                  block. Chronic systolic heart failure/cardiac amyloidosis.                 History of pericardial effusion s/p pericardial window.  Sonographer:    Darlina Sicilian RDCS Referring Phys: [place]  1. Speckled appearance to LV myocardium consistent with diagnosis of amyloid Global hypokinesis worse in the posterior lateral wall . Left ventricular ejection fraction, by estimation, is 30 to 35%. The left ventricle has moderately decreased function. The left ventricle demonstrates global hypokinesis. There is severe left ventricular hypertrophy. Left ventricular diastolic parameters are indeterminate. Elevated left ventricular end-diastolic pressure.  2. Right ventricular systolic function is normal. The right ventricular size is normal. There is moderately elevated pulmonary artery systolic pressure.  3. Left atrial size was moderately dilated.  4. Right atrial size was moderately dilated.  5. The mitral valve is normal in structure. Mild mitral valve regurgitation. No evidence of mitral stenosis.  6. The aortic valve is tricuspid. There is mild calcification of the aortic valve. There is mild thickening of the aortic valve. Aortic valve regurgitation is not visualized. Aortic valve sclerosis is present, with no evidence of aortic valve stenosis.  7. Aortic dilatation noted. There is moderate dilatation of the ascending aorta, measuring 41 mm.  8. The inferior vena cava is dilated in size with <50% respiratory variability, suggesting right atrial pressure of 15 mmHg. FINDINGS  Left Ventricle: Speckled appearance to LV myocardium consistent with diagnosis of amyloid Global hypokinesis worse in the posterior lateral wall. Left ventricular ejection fraction, by estimation, is 30 to 35%. The left ventricle has moderately decreased function. The left ventricle demonstrates global hypokinesis. Definity contrast agent was given IV to delineate the left ventricular endocardial borders. The left  ventricular internal cavity size was normal in size. There is severe left ventricular hypertrophy. Left ventricular diastolic parameters are indeterminate. Elevated left ventricular end-diastolic pressure. Right Ventricle: The right ventricular size is normal. No increase in right ventricular wall thickness. Right ventricular systolic function is normal. There is moderately elevated pulmonary artery systolic pressure. The tricuspid regurgitant velocity is 2.86 m/s, and with an assumed right atrial pressure of 15 mmHg, the estimated right ventricular systolic pressure is 38.7 mmHg. Left Atrium: Left atrial size was moderately dilated. Right Atrium: Right atrial  size was moderately dilated. Pericardium: There is no evidence of pericardial effusion. Mitral Valve: The mitral valve is normal in structure. Mild mitral valve regurgitation. No evidence of mitral valve stenosis. Tricuspid Valve: The tricuspid valve is normal in structure. Tricuspid valve regurgitation is mild . No evidence of tricuspid stenosis. Aortic Valve: The aortic valve is tricuspid. There is mild calcification of the aortic valve. There is mild thickening of the aortic valve. Aortic valve regurgitation is not visualized. Aortic valve sclerosis is present, with no evidence of aortic valve stenosis. Pulmonic Valve: The pulmonic valve was normal in structure. Pulmonic valve regurgitation is mild. No evidence of pulmonic stenosis. Aorta: Aortic dilatation noted. There is moderate dilatation of the ascending aorta, measuring 41 mm. Venous: The inferior vena cava is dilated in size with less than 50% respiratory variability, suggesting right atrial pressure of 15 mmHg. IAS/Shunts: No atrial level shunt detected by color flow Doppler.  LEFT VENTRICLE PLAX 2D LVIDd:         4.70 cm      Diastology LVIDs:         3.60 cm      LV e' medial:    5.66 cm/s LV PW:         1.70 cm      LV E/e' medial:  18.9 LV IVS:        2.15 cm      LV e' lateral:   7.29 cm/s LVOT  diam:     2.20 cm      LV E/e' lateral: 14.7 LV SV:         79 LV SV Index:   41 LVOT Area:     3.80 cm  LV Volumes (MOD) LV vol d, MOD A2C: 244.0 ml LV vol d, MOD A4C: 187.5 ml LV vol s, MOD A2C: 133.0 ml LV vol s, MOD A4C: 111.0 ml LV SV MOD A2C:     111.0 ml LV SV MOD A4C:     187.5 ml LV SV MOD BP:      92.3 ml RIGHT VENTRICLE RV S prime:     5.00 cm/s TAPSE (M-mode): 1.6 cm LEFT ATRIUM             Index        RIGHT ATRIUM           Index LA diam:        4.50 cm 2.36 cm/m   RA Area:     23.10 cm LA Vol (A2C):   87.3 ml 45.87 ml/m  RA Volume:   67.60 ml  35.52 ml/m LA Vol (A4C):   61.9 ml 32.52 ml/m LA Biplane Vol: 75.4 ml 39.62 ml/m  AORTIC VALVE             PULMONIC VALVE LVOT Vmax:   103.00 cm/s PR End Diast Vel: 8.88 msec LVOT Vmean:  76.600 cm/s LVOT VTI:    0.207 m  AORTA Ao Root diam: 2.90 cm Ao Asc diam:  4.05 cm MITRAL VALVE                TRICUSPID VALVE MV Area (PHT): 3.91 cm     TR Peak grad:   32.7 mmHg MV Decel Time: 194 msec     TR Vmax:        286.00 cm/s MV E velocity: 107.00 cm/s  SHUNTS                             Systemic VTI:  0.21 m                             Systemic Diam: 2.20 cm Jenkins Rouge MD Electronically signed by Jenkins Rouge MD Signature Date/Time: 03/23/2022/2:24:17 PM    Final    DG Chest 2 View  Result Date: 03/22/2022 CLINICAL DATA:  Shortness of breath EXAM: CHEST - 2 VIEW COMPARISON:  01/05/2022 FINDINGS: Gross cardiomegaly. Diffuse bilateral interstitial pulmonary opacity. The visualized skeletal structures are unremarkable. IMPRESSION: Gross cardiomegaly with diffuse bilateral interstitial pulmonary opacity, likely edema. No focal airspace opacity. Electronically Signed   By: Delanna Ahmadi M.D.   On: 03/22/2022 16:56     Scheduled Meds:  [MAR Hold] allopurinol  50 mg Oral Daily   [MAR Hold] apixaban  5 mg Oral BID   [MAR Hold] aspirin  81 mg Oral Daily   [MAR Hold] hydrALAZINE  100 mg Oral TID   [MAR Hold] insulin aspart  0-9  Units Subcutaneous TID WC   [MAR Hold] rosuvastatin  40 mg Oral Daily   [MAR Hold] sevelamer carbonate  800 mg Oral TID WC   [MAR Hold] sodium bicarbonate  650 mg Oral BID   [MAR Hold] Tafamidis  61 mg Oral Daily   Continuous Infusions:  sodium chloride       LOS: 1 day    Time spent: 5mn    PDomenic Polite MD [facility]   03/24/2022, 2:04 PM

## 2022-03-24 NOTE — Interval H&P Note (Signed)
History and Physical Interval Note:  03/24/2022 1:00 PM  Corey [name]  has presented today for surgery, with the diagnosis of afib.  The various methods of treatment have been discussed with the patient and family. After consideration of risks, benefits and other options for treatment, the patient has consented to  Procedure(s): TRANSESOPHAGEAL ECHOCARDIOGRAM (TEE) (N/A) CARDIOVERSION (N/A) as a surgical intervention.  The patient's history has been reviewed, patient examined, no change in status, stable for surgery.  I have reviewed the patient's chart and labs.  Questions were answered to the patient's satisfaction.     Malayja Freund

## 2022-03-24 NOTE — TOC Initial Note (Signed)
Transition of Care [facility]) - Initial/Assessment Note    Patient Details  Name: Corey [name] MRN: [id number] Date of Birth: 05-Aug-1945  Transition of Care [facility]) CM/SW Contact:    Erenest Rasher, RN Phone Number: [phone] 03/24/2022, 12:44 PM  Clinical Narrative:                  HF TOC spoke to pt and gave permission to speak to wife, Idanha. States pt had [facility] in the past. Waiting for PT/OT recommendations for home. States pt has used all his transportation rides with [employer] transportation. She has been providing transportation. Has scale to do daily weights.    Expected Discharge Plan: [place] Barriers to Discharge: Continued Medical Work up   Patient Goals and CMS Choice   CMS Medicare.gov Compare Post Acute Care list provided to:: Patient Represenative (must comment) [name] -wife) Choice offered to / list presented to : Spouse  Expected Discharge Plan and Services Expected Discharge Plan: [place]   Discharge Planning Services: CM Consult Post Acute Care Choice: [place] arrangements for the past 2 months: [place]: [place]        Prior Living Arrangements/Services Living arrangements for the past 2 months: Single Family Home Lives with:: Spouse Patient language and need for interpreter reviewed:: Yes Do you feel safe going back to the place where you live?: Yes      Need for Family Participation in Patient Care: Yes (Comment) Care giver support system in place?: Yes (comment)   Criminal Activity/Legal Involvement Pertinent to Current Situation/Hospitalization: No - Comment as needed  Activities of Daily Living Home Assistive Devices/Equipment: None ADL Screening (condition at time of admission) Patient's cognitive ability adequate to safely complete daily activities?: Yes Is the patient deaf or have difficulty hearing?: Yes Does the  patient have difficulty seeing, even when wearing glasses/contacts?: No Does the patient have difficulty concentrating, remembering, or making decisions?: No Patient able to express need for assistance with ADLs?: Yes Does the patient have difficulty dressing or bathing?: No Independently performs ADLs?: Yes (appropriate for developmental age) Does the patient have difficulty walking or climbing stairs?: No Weakness of Legs: None Weakness of Arms/Hands: None  Permission Sought/Granted Permission sought to share information with : Case Manager, Family Supports, PCP Permission granted to share information with : Yes, Verbal Permission Granted  Share Information with NAME: Juluis Mire     Permission granted to share info w Relationship: wife  Permission granted to share info w Contact Information: [phone]  Emotional Assessment Appearance:: Appears stated age Attitude/Demeanor/Rapport: Engaged Affect (typically observed): Accepting Orientation: : Oriented to Self, Oriented to Place, Oriented to  Time, Oriented to Situation   Psych Involvement: No (comment)  Admission diagnosis:  Acute on chronic combined systolic and diastolic heart failure (HCC) [I50.43] Cardiorenal syndrome with renal failure, stage 1-4 or unspecified chronic kidney disease, with heart failure (HCC) [I13.0] Atrial flutter, unspecified type ([place]) [I48.92] Acute on chronic congestive heart failure, unspecified heart failure type [facility]) [I50.9] Patient Active Problem List   Diagnosis Date Noted   Acute on chronic combined systolic and diastolic heart failure ([place]) 03/23/2022   Paroxysmal atrial fibrillation ([place]) 01/12/2022   Weight gain 01/05/2022   Acute cough 01/05/2022   Bilateral  edema of lower extremity 01/05/2022   Leg wound, right, initial encounter 10/24/2021   Weakness of both lower extremities 10/24/2021   Chronic kidney disease (CKD), stage IV (severe) ([place]) 09/29/2021   Pure hypercholesterolemia  07/05/2021   Preoperative cardiovascular examination 04/04/2021   HFrEF (heart failure with reduced ejection fraction) ([place]) 04/03/2021   Bilateral hearing loss 01/04/2021   Bilateral impacted cerumen 01/04/2021   Tympanic membrane perforation, left 01/04/2021   Physical deconditioning 10/16/2020   Hereditary cardiac amyloidosis ([place]) 09/30/2020   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   Progressive angina ([place]) - Atypical; DOE 09/20/2020   CAD (coronary artery disease) 09/20/2020   Soft tissue mass 07/30/2020   Contracture of joint of finger of left hand 07/30/2020   DOE (dyspnea on exertion) 03/01/2019   Mild cognitive impairment 12/05/2018   Muscle twitching 07/23/2018   OSA (obstructive sleep apnea) 03/27/2018   Snores 01/21/2018   Poor balance 01/23/2017   Ankle swelling, left 09/06/2016   Bradycardia 01/28/2016   Weak urinary stream 12/31/2015   Ventral hernia without obstruction or gangrene 09/15/2015   Prostate cancer ([place]) 07/25/2015   Erectile dysfunction 05/04/2015   Urinary frequency 05/04/2015   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   Abnormal finding on MRI of brain 02/14/2015   Leg wound, right 01/08/2013   PEA (Pulseless electrical activity) ([place]) 12/03/2012   Diabetes ([place]) 06/30/2011   Gout 03/05/2007   Essential hypertension 03/05/2007   PCP:  [name], MD Pharmacy:   [facility] ([place]), [place] [address] [place] [address] Phone: [phone] Fax: [phone]  [employer], [place] [address] Phone: [phone] Fax: [phone]  [place] ([place] - [place], [place] [address] Phone: [phone] Fax: [phone]     Social Determinants of Health ([place]) Interventions    Readmission  Risk Interventions    09/30/2020    2:29 PM  Readmission Risk Prevention Plan  Transportation Screening Complete  PCP or Specialist Appt within 5-7 Days Complete  Home Care Screening Complete  Medication Review (RN CM) Complete

## 2022-03-25 DIAGNOSIS — I5043 Acute on chronic combined systolic (congestive) and diastolic (congestive) heart failure: Secondary | ICD-10-CM | POA: Diagnosis not present

## 2022-03-25 DIAGNOSIS — I509 Heart failure, unspecified: Secondary | ICD-10-CM

## 2022-03-25 LAB — BASIC METABOLIC PANEL
Anion gap: 11 (ref 5–15)
BUN: 78 mg/dL — ABNORMAL HIGH (ref 8–23)
CO2: 21 mmol/L — ABNORMAL LOW (ref 22–32)
Calcium: 9.5 mg/dL (ref 8.9–10.3)
Chloride: 108 mmol/L (ref 98–111)
Creatinine, Ser: 5.22 mg/dL — ABNORMAL HIGH (ref 0.61–1.24)
GFR, Estimated: 11 mL/min — ABNORMAL LOW (ref >60)
Glucose, Bld: 133 mg/dL — ABNORMAL HIGH (ref 70–99)
Potassium: 3.9 mmol/L (ref 3.5–5.1)
Sodium: 140 mmol/L (ref 135–145)

## 2022-03-25 LAB — GLUCOSE, CAPILLARY
Glucose-Capillary: 120 mg/dL — ABNORMAL HIGH (ref 70–99)
Glucose-Capillary: 168 mg/dL — ABNORMAL HIGH (ref 70–99)
Glucose-Capillary: 161 mg/dL — ABNORMAL HIGH (ref 70–99)
Glucose-Capillary: 100 mg/dL — ABNORMAL HIGH (ref 70–99)

## 2022-03-25 LAB — CBC
HCT: 34.5 % — ABNORMAL LOW (ref 39.0–52.0)
Hemoglobin: 11.5 g/dL — ABNORMAL LOW (ref 13.0–17.0)
MCH: 28.6 pg (ref 26.0–34.0)
MCHC: 33.3 g/dL (ref 30.0–36.0)
MCV: 85.8 fL (ref 80.0–100.0)
Platelets: 120 10*3/uL — ABNORMAL LOW (ref 150–400)
RBC: 4.02 MIL/uL — ABNORMAL LOW (ref 4.22–5.81)
RDW: 16.6 % — ABNORMAL HIGH (ref 11.5–15.5)
WBC: 4.8 10*3/uL (ref 4.0–10.5)
nRBC: 0 % (ref 0.0–0.2)

## 2022-03-25 MED ORDER — TAFAMIDIS 61 MG PO CAPS
1.0000 | ORAL_CAPSULE | Freq: Every day | ORAL | Status: DC
  Administered 2022-03-25 – 2022-03-29 (×5): 1 via ORAL
  Filled 2022-03-25 (×5): qty 1

## 2022-03-25 NOTE — Progress Notes (Signed)
Rounding Note  PCP-Cardiologist: Candee Furbish, MD   Subjective:     2.8L UOP yesterday with '120mg'$  Lasix IV x1. Diuretics stopped after RHC showed low filling pressures.  He had TEE  He reports that he feels well today though short of breath after walking around.  Objective:   Weight Range: 73.3 kg Body mass index is 26.07 kg/m.   Vital Signs:   Temp:  [97.5 F (36.4 C)-99.3 F (37.4 C)] 98.7 F (37.1 C) (11/11 0756) Pulse Rate:  [45-61] 59 (11/11 0037) Resp:  [14-22] 22 (11/11 1141) BP: (97-170)/(41-115) 113/78 (11/11 1141) SpO2:  [95 %-100 %] 98 % (11/11 1141) Weight:  [73.3 kg] 73.3 kg (11/11 0603) Last BM Date : 03/22/22  Weight change: Filed Weights   03/24/22 0449 03/24/22 0847 03/25/22 0603  Weight: 79.5 kg 76 kg 73.3 kg    Intake/Output:   Intake/Output Summary (Last 24 hours) at 03/25/2022 1333 Last data filed at 03/25/2022 1309 Gross per 24 hour  Intake 1200 ml  Output 3350 ml  Net -2150 ml      Physical Exam    General:  Comfortable at rest HEENT: Normal Neck: Supple. JVP 12 cm.  Carotids 2+ bilat; no bruits.  Cor: PMI nondisplaced. Irregular rhythm, brady. No rubs, gallops or murmurs. Lungs: Clear Abdomen: Soft, nontender, nondistended.  Extremities: No cyanosis, clubbing, rash, 1+ edema, + UNNA Neuro: Alert & orientedx3, cranial nerves grossly intact. moves all 4 extremities w/o difficulty. Affect pleasant   Telemetry   AF yesterday evening organizing to atrial flutter today.   Labs    CBC Recent Labs    03/22/22 1659 03/24/22 0032 03/24/22 1428 03/25/22 0035  WBC 2.7* 2.7*  --  4.8  NEUTROABS 1.6*  --   --   --   HGB 11.6* 11.2* 11.6*  11.9* 11.5*  HCT 36.4* 35.5* 34.0*  35.0* 34.5*  MCV 87.7 87.7  --  85.8  PLT 103* 117*  --  619*   Basic Metabolic Panel Recent Labs    03/24/22 0032 03/24/22 1428 03/25/22 0035  NA 140 141  142 140  K 3.7 3.6  3.6 3.9  CL 106  --  108  CO2 21*  --  21*  GLUCOSE 113*  --   133*  BUN 76*  --  78*  CREATININE 4.93*  --  5.22*  CALCIUM 9.3  --  9.5  PHOS 6.7*  --   --    Liver Function Tests No results for input(s): "AST", "ALT", "ALKPHOS", "BILITOT", "PROT", "ALBUMIN" in the last 72 hours. No results for input(s): "LIPASE", "AMYLASE" in the last 72 hours. Cardiac Enzymes No results for input(s): "CKTOTAL", "CKMB", "CKMBINDEX", "TROPONINI" in the last 72 hours.  BNP: BNP (last 3 results) Recent Labs    03/22/22 1659  BNP 1,571.3*    ProBNP (last 3 results) Recent Labs    01/05/22 1125  PROBNP 1,740.0*     D-Dimer No results for input(s): "DDIMER" in the last 72 hours. Hemoglobin A1C Recent Labs    03/23/22 0630  HGBA1C 5.7*   Fasting Lipid Panel Recent Labs    03/23/22 1615  CHOL 86  HDL 44  LDLCALC 31  TRIG 54  CHOLHDL 2.0   Thyroid Function Tests No results for input(s): "TSH", "T4TOTAL", "T3FREE", "THYROIDAB" in the last 72 hours.  Invalid input(s): "FREET3"  Other results:   Imaging    CARDIAC CATHETERIZATION  Result Date: 03/24/2022 HEMODYNAMICS: RA:   5 mmHg (mean) RV:  54/5 mmHg PA:   52/18 mmHg (31 mean) PCWP:  10-12 mmHg with V waves to 15    Estimated Fick CO/CI   5.4 L/min, 2.9 L/min/m2    PVR     3.7 Wood Units PAPi      >6  IMPRESSION: 1.Low left and right heart filling pressures. 2.PCWP V waves secondary to poor left atrial compliance. 3.Normal cardiac output/index; possibly elevated due to AV fistula. 4.Mildly elevated PVR. RECOMMENDATIONS: -Hold further diuretics    ECG 11/8: atrial flutter, V-rate 51 bpm ECG 11/10: Sinus bradycardia with first degree AV block and blocked PACs  Medications:     Scheduled Medications:  allopurinol  50 mg Oral Daily   apixaban  5 mg Oral BID   aspirin  81 mg Oral Daily   hydrALAZINE  100 mg Oral TID   insulin aspart  0-9 Units Subcutaneous TID WC   rosuvastatin  40 mg Oral Daily   senna-docusate  1 tablet Oral BID   sevelamer carbonate  800 mg Oral TID WC   sodium  bicarbonate  650 mg Oral BID   sodium chloride flush  3 mL Intravenous Q12H   Tafamidis  1 capsule Oral Daily    Infusions:  sodium chloride      PRN Medications: sodium chloride, acetaminophen, ondansetron **OR** ondansetron (ZOFRAN) IV, ondansetron (ZOFRAN) IV, mouth rinse, phenol, sodium chloride flush    Patient Profile   76 y.o.  AAM  with w/ hx chronic pericardial effusion s/p pericardial window, cardiac amyloidosis, CAD, combined systolic CHF, prior PEA arrest, HTN, HLD, DM type 2, hx CVA, OSA, cognitive impairment, paroxysmal a fib and prostate cancer.   Admitted from Cardiology office with a/c CHF and AKI on CKD. AHF team asked to see for a/c CHF in setting of cardiac amyloidosis.    Assessment/Plan   Chronic systolic heart failure/cardiac amyloidosis  -Pericardial effusion s/p pericardial window with myocardial biopsy 05/22. Biopsy came back positive for amyloidosis, biopsy suggested AL. Serum and urine studies however were not consistent with AL amyloidosis. 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  - Limited echo 09/30/20: EF 40%, trace pericardial fluid post window, severe LVH, RV moderately reduced with severely increased wall thickness - Echo this admit: EF 30-35%, severe LVH, RV okay, RVSP 48 mmHg, dilated IVC - RHC with PCWP 10-12 - NYHA class III on admission - Overdiuresed - Continue tafamidis  - Continue hydral 100 TID, previously on Imdur 60 daily (will not add back to allow BP for renal perfusion) - Limited benefit to GDMT in ATTR amyloid - UNNA boots  CAD  - 05/22 LHC for angina: 80% stenosis LAD noted. PCI aborted d/t marked volume overload and large pericardial effusion - HsTrop [phone] this admit, suspect demand ischemia. Denies angina. - Continue rosuvastatin, LDL 31 - Continue ASA with CAD    Paroxsymal atrial fibrillation/ atrial flutter - Found to be in a fib 8/23 - Appears to be in AFL with slow ventricular  response, 50s. Hesitant to add amiodarone with bradycardia. Not a candidate for any antiarrhythmic drug other than amiodarone due to CKD. - ambulate hall to assess for chronotropic competence. He may benefit from a pacemaker - though this should be considered carefully for a patient on dialysis due to increased infection risk and potential use of a dialysis access site. HR increases to 70 bpm just getting up to bedside. - Continue Eliquis 5 BID   Pericardial effusion s/p pericardial window  -S/p pericardial window  due to large pericardial effusion in 05/22.   AKI on CKD stage IV - recently had AV fistula placed - baseline Scr 3.3-3.9, Scr this admit 4.58>4.93 - Nephrology following - Attempting to avoid HD this admit (fistula placed 01/2022)      Length of Stay: 2  Melida Quitter, MD  03/25/2022, 1:33 PM

## 2022-03-25 NOTE — Plan of Care (Signed)
Pt restarted on home med, tafamidis today. Pt having adequate urine output.  Problem: Education: Goal: Ability to describe self-care measures that may prevent or decrease complications (Diabetes Survival Skills Education) will improve Outcome: Progressing Goal: Individualized Educational Video(s) Outcome: Progressing   Problem: Coping: Goal: Ability to adjust to condition or change in health will improve Outcome: Progressing   Problem: Fluid Volume: Goal: Ability to maintain a balanced intake and output will improve Outcome: Progressing   Problem: Health Behavior/Discharge Planning: Goal: Ability to identify and utilize available resources and services will improve Outcome: Progressing Goal: Ability to manage health-related needs will improve Outcome: Progressing   Problem: Metabolic: Goal: Ability to maintain appropriate glucose levels will improve Outcome: Progressing   Problem: Nutritional: Goal: Maintenance of adequate nutrition will improve Outcome: Progressing Goal: Progress toward achieving an optimal weight will improve Outcome: Progressing   Problem: Skin Integrity: Goal: Risk for impaired skin integrity will decrease Outcome: Progressing   Problem: Tissue Perfusion: Goal: Adequacy of tissue perfusion will improve Outcome: Progressing   Problem: Education: Goal: Ability to demonstrate management of disease process will improve Outcome: Progressing Goal: Ability to verbalize understanding of medication therapies will improve Outcome: Progressing Goal: Individualized Educational Video(s) Outcome: Progressing   Problem: Activity: Goal: Capacity to carry out activities will improve Outcome: Progressing   Problem: Cardiac: Goal: Ability to achieve and maintain adequate cardiopulmonary perfusion will improve Outcome: Progressing   Problem: Education: Goal: Knowledge of General Education information will improve Description: Including pain rating scale,  medication(s)/side effects and non-pharmacologic comfort measures Outcome: Progressing   Problem: Health Behavior/Discharge Planning: Goal: Ability to manage health-related needs will improve Outcome: Progressing   Problem: Clinical Measurements: Goal: Ability to maintain clinical measurements within normal limits will improve Outcome: Progressing Goal: Will remain free from infection Outcome: Progressing Goal: Diagnostic test results will improve Outcome: Progressing Goal: Respiratory complications will improve Outcome: Progressing Goal: Cardiovascular complication will be avoided Outcome: Progressing   Problem: Activity: Goal: Risk for activity intolerance will decrease Outcome: Progressing   Problem: Nutrition: Goal: Adequate nutrition will be maintained Outcome: Progressing   Problem: Coping: Goal: Level of anxiety will decrease Outcome: Progressing   Problem: Elimination: Goal: Will not experience complications related to bowel motility Outcome: Progressing Goal: Will not experience complications related to urinary retention Outcome: Progressing   Problem: Pain Managment: Goal: General experience of comfort will improve Outcome: Progressing   Problem: Safety: Goal: Ability to remain free from injury will improve Outcome: Progressing   Problem: Skin Integrity: Goal: Risk for impaired skin integrity will decrease Outcome: Progressing   Problem: Education: Goal: Understanding of CV disease, CV risk reduction, and recovery process will improve Outcome: Progressing Goal: Individualized Educational Video(s) Outcome: Progressing   Problem: Activity: Goal: Ability to return to baseline activity level will improve Outcome: Progressing   Problem: Cardiovascular: Goal: Ability to achieve and maintain adequate cardiovascular perfusion will improve Outcome: Progressing Goal: Vascular access site(s) Level 0-1 will be maintained Outcome: Progressing   Problem:  Health Behavior/Discharge Planning: Goal: Ability to safely manage health-related needs after discharge will improve Outcome: Progressing

## 2022-03-25 NOTE — Progress Notes (Signed)
Corey [name] Progress Note   76 y.o. male with HTN, HLD, DM, CVA, A-fib, prostate CA, amyloidosis, combined systolic and diastolic CHF (EF 60-45% w/ gr 2 diastolic dysfn), PEA arrest, chronic pericardial effusion status post pericardial window, CKD stage IV followed by Dr. Royce [name] at [facility] p/w worsening shortness of breath and DOE. Recent baseline serum creatinine level seems to be around 3.3-3.9 on torsemide 20 mg 3 times a week at home.  He underwent left brachial artery to cephalic vein AV fistula creation by Dr. Donzetta [name] on 01/31/2022. In the ED, Cr 4.58, CO2 19, BNP 1571, troponin 856, hemoglobin 11.6.  The chest x-ray consistent with diffuse bilateral pulmonary edema. Brisk response after Lasix 20 mg IV and a dose of metolazone with urine output 1800 documented.   Assessment/ Plan:   1) Acute on chronic CKD stage IV with fluid overload w/ CKD thought to be due to diabetes and hypertension with recent baseline serum creatinine level around 3.3-3.9 followed by Dr. Harrie [name].  Received IV Lasix in ER with urine output documented around 1.8 L.  He is currently on room air. Already 4557m / past 24hrs with worsening renal function.   Would like to try and avoid starting dialysis in the hospital unless absolutely necessary and the pt agrees. But if necessary he's willing to initiate hemodialysis during this admission.    No urgent need for HD today; RHC was planned for 11/10 with possible TEE + CV. UOP still very high (4.5L overnight) and would continue holding diuresis given worsening renal function. Has JVD but no peripheral edema outside of his left arm where the BCF is located, lungs are clear. Is he preload dependent? Again, hold diuresis for now.   2) Vascular dialysis access: Left upper extremity AV fistula is not mature yet; if he needs RRT will need a TC.   3) Acute on chronic combined CHF: Getting echocardiogram.  Diuresing with IV Lasix and metolazone.  Cardiology is following.    4) Metabolic acidosis: Started on oral sodium bicarbonate just 1 tab BID.   5) Anemia of CKD: Hemoglobin at goal.  Continue to monitor.   6) CKD-MBD: Calcium acceptable, phosphorus 6.7 -> Renvela 1 TIDM  Subjective:   4.5L UOP overnight. Denies SOB/n/v/f/c/cp   Objective:   BP (!) 153/56   Pulse (!) 59   Temp 98.7 F (37.1 C) (Oral)   Resp 17   Ht '5\' 6"'$  (1.676 m)   Wt 73.3 kg   SpO2 99%   BMI 26.07 kg/m   Intake/Output Summary (Last 24 hours) at 03/25/2022 1103 Last data filed at 03/25/2022 1008 Gross per 24 hour  Intake 1210 ml  Output 2650 ml  Net -1440 ml   Weight change: -3.522 kg  Physical Exam: General exam: Appears calm and comfortable  Respiratory system: Basal crackles, no wheezing, Cardiovascular system: S1 & S2 heard, RRR.  Left leg edema looks chronic.. Gastrointestinal system: Abdomen is nondistended, soft and nontender. Normal bowel sounds heard. Central nervous system: Alert and oriented. No focal neurological deficits. Extremities: Symmetric 5 x 5 power.  Edema in the left leg. Skin: No rashes, lesions or ulcers Psychiatry: Judgement and insight appear normal. Mood & affect appropriate.  Vascular dialysis access: Left upper extremity BCF, good thrill and bruit.    Imaging: CARDIAC CATHETERIZATION  Result Date: 03/24/2022 HEMODYNAMICS: RA:   5 mmHg (mean) RV:   54/5 mmHg PA:   52/18 mmHg (31 mean) PCWP:  10-12 mmHg with V waves to 15  Estimated Fick CO/CI   5.4 L/min, 2.9 L/min/m2    PVR     3.7 Wood Units PAPi      >6  IMPRESSION: 1.Low left and right heart filling pressures. 2.PCWP V waves secondary to poor left atrial compliance. 3.Normal cardiac output/index; possibly elevated due to AV fistula. 4.Mildly elevated PVR. RECOMMENDATIONS: -Hold further diuretics   CT HEAD WO CONTRAST (5MM)  Result Date: 03/23/2022 CLINICAL DATA:  Acute neuro deficit. Slurred speech. Patient on dialysis. EXAM: CT HEAD WITHOUT CONTRAST TECHNIQUE: Contiguous axial  images were obtained from the base of the skull through the vertex without intravenous contrast. RADIATION DOSE REDUCTION: This exam was performed according to the departmental dose-optimization program which includes automated exposure control, adjustment of the mA and/or kV according to patient size and/or use of iterative reconstruction technique. COMPARISON:  CT head 09/19/2012.  MRI head with contrast 01/07/2015 FINDINGS: Brain: Extensive and diffuse dural ossification involving the falx and the dura around the cerebral hemispheres bilaterally. There is also involvement of the tentorium. This shows mild progression since 2008. There is diffuse dural enhancement on the prior MRI. No brain edema identified. Mild atrophy. Ventricle size normal. Mild white matter hypodensity bilaterally. Negative for acute infarct or hemorrhage. Vascular: Negative for hyperdense vessel Skull: No focal skeletal lesion. Sinuses/Orbits: Paranasal sinuses clear.  Left cataract extraction Other: None IMPRESSION: 1. No acute intracranial abnormality. Mild atrophy and mild chronic microvascular ischemic change in the white matter. 2. Extensive and diffuse dural ossification. This has progressed since 2008. Consider metabolic disorder. Correlate with serum calcium and parathyroid hormone level. Diffuse meningioma also possible. Recommend follow-up MRI head without and with contrast for comparison with 2016. Electronically Signed   By: Franchot Gallo M.D.   On: 03/23/2022 15:14   ECHOCARDIOGRAM COMPLETE  Result Date: 03/23/2022    ECHOCARDIOGRAM REPORT   Patient Name:   Corey [name] Date of Exam: 03/23/2022 Medical Rec #:  [id number]    Height:       66.0 in Accession #:    [id number]   Weight:       178.0 lb Date of Birth:  08-09-1945    BSA:          1.903 m Patient Age:    38 years     BP:           136/122 mmHg Patient Gender: M            HR:           48 bpm. Exam Location:  Inpatient Procedure: 2D Echo, Cardiac Doppler, Color  Doppler and Intracardiac            Opacification Agent Indications:    CHF-Acute Systolic E17.40  History:        Patient has prior history of Echocardiogram examinations, most                 recent 09/30/2020. CAD; Arrythmias:Bradycardia with first-degree                 block. Chronic systolic heart failure/cardiac amyloidosis.                 History of pericardial effusion s/p pericardial window.  Sonographer:    Darlina Sicilian RDCS Referring Phys: [place]  1. Speckled appearance to LV myocardium consistent with diagnosis of amyloid Global hypokinesis worse in the posterior lateral wall . Left ventricular ejection fraction, by estimation, is 30 to 35%. The left ventricle has  moderately decreased function. The left ventricle demonstrates global hypokinesis. There is severe left ventricular hypertrophy. Left ventricular diastolic parameters are indeterminate. Elevated left ventricular end-diastolic pressure.  2. Right ventricular systolic function is normal. The right ventricular size is normal. There is moderately elevated pulmonary artery systolic pressure.  3. Left atrial size was moderately dilated.  4. Right atrial size was moderately dilated.  5. The mitral valve is normal in structure. Mild mitral valve regurgitation. No evidence of mitral stenosis.  6. The aortic valve is tricuspid. There is mild calcification of the aortic valve. There is mild thickening of the aortic valve. Aortic valve regurgitation is not visualized. Aortic valve sclerosis is present, with no evidence of aortic valve stenosis.  7. Aortic dilatation noted. There is moderate dilatation of the ascending aorta, measuring 41 mm.  8. The inferior vena cava is dilated in size with <50% respiratory variability, suggesting right atrial pressure of 15 mmHg. FINDINGS  Left Ventricle: Speckled appearance to LV myocardium consistent with diagnosis of amyloid Global hypokinesis worse in the posterior lateral wall. Left  ventricular ejection fraction, by estimation, is 30 to 35%. The left ventricle has moderately decreased function. The left ventricle demonstrates global hypokinesis. Definity contrast agent was given IV to delineate the left ventricular endocardial borders. The left ventricular internal cavity size was normal in size. There is severe left ventricular hypertrophy. Left ventricular diastolic parameters are indeterminate. Elevated left ventricular end-diastolic pressure. Right Ventricle: The right ventricular size is normal. No increase in right ventricular wall thickness. Right ventricular systolic function is normal. There is moderately elevated pulmonary artery systolic pressure. The tricuspid regurgitant velocity is 2.86 m/s, and with an assumed right atrial pressure of 15 mmHg, the estimated right ventricular systolic pressure is 38.7 mmHg. Left Atrium: Left atrial size was moderately dilated. Right Atrium: Right atrial size was moderately dilated. Pericardium: There is no evidence of pericardial effusion. Mitral Valve: The mitral valve is normal in structure. Mild mitral valve regurgitation. No evidence of mitral valve stenosis. Tricuspid Valve: The tricuspid valve is normal in structure. Tricuspid valve regurgitation is mild . No evidence of tricuspid stenosis. Aortic Valve: The aortic valve is tricuspid. There is mild calcification of the aortic valve. There is mild thickening of the aortic valve. Aortic valve regurgitation is not visualized. Aortic valve sclerosis is present, with no evidence of aortic valve stenosis. Pulmonic Valve: The pulmonic valve was normal in structure. Pulmonic valve regurgitation is mild. No evidence of pulmonic stenosis. Aorta: Aortic dilatation noted. There is moderate dilatation of the ascending aorta, measuring 41 mm. Venous: The inferior vena cava is dilated in size with less than 50% respiratory variability, suggesting right atrial pressure of 15 mmHg. IAS/Shunts: No atrial  level shunt detected by color flow Doppler.  LEFT VENTRICLE PLAX 2D LVIDd:         4.70 cm      Diastology LVIDs:         3.60 cm      LV e' medial:    5.66 cm/s LV PW:         1.70 cm      LV E/e' medial:  18.9 LV IVS:        2.15 cm      LV e' lateral:   7.29 cm/s LVOT diam:     2.20 cm      LV E/e' lateral: 14.7 LV SV:         79 LV SV Index:   41 LVOT Area:  3.80 cm  LV Volumes (MOD) LV vol d, MOD A2C: 244.0 ml LV vol d, MOD A4C: 187.5 ml LV vol s, MOD A2C: 133.0 ml LV vol s, MOD A4C: 111.0 ml LV SV MOD A2C:     111.0 ml LV SV MOD A4C:     187.5 ml LV SV MOD BP:      92.3 ml RIGHT VENTRICLE RV S prime:     5.00 cm/s TAPSE (M-mode): 1.6 cm LEFT ATRIUM             Index        RIGHT ATRIUM           Index LA diam:        4.50 cm 2.36 cm/m   RA Area:     23.10 cm LA Vol (A2C):   87.3 ml 45.87 ml/m  RA Volume:   67.60 ml  35.52 ml/m LA Vol (A4C):   61.9 ml 32.52 ml/m LA Biplane Vol: 75.4 ml 39.62 ml/m  AORTIC VALVE             PULMONIC VALVE LVOT Vmax:   103.00 cm/s PR End Diast Vel: 8.88 msec LVOT Vmean:  76.600 cm/s LVOT VTI:    0.207 m  AORTA Ao Root diam: 2.90 cm Ao Asc diam:  4.05 cm MITRAL VALVE                TRICUSPID VALVE MV Area (PHT): 3.91 cm     TR Peak grad:   32.7 mmHg MV Decel Time: 194 msec     TR Vmax:        286.00 cm/s MV E velocity: 107.00 cm/s                             SHUNTS                             Systemic VTI:  0.21 m                             Systemic Diam: 2.20 cm Jenkins Rouge MD Electronically signed by Jenkins Rouge MD Signature Date/Time: 03/23/2022/2:24:17 PM    Final     Labs: BMET Recent Labs  Lab 03/22/22 1659 03/24/22 0032 03/24/22 1428 03/25/22 0035  NA 143 140 141  142 140  K 3.8 3.7 3.6  3.6 3.9  CL 113* 106  --  108  CO2 19* 21*  --  21*  GLUCOSE 78 113*  --  133*  BUN 70* 76*  --  78*  CREATININE 4.58* 4.93*  --  5.22*  CALCIUM 9.6 9.3  --  9.5  PHOS  --  6.7*  --   --    CBC Recent Labs  Lab 03/22/22 1659 03/24/22 0032 03/24/22 1428  03/25/22 0035  WBC 2.7* 2.7*  --  4.8  NEUTROABS 1.6*  --   --   --   HGB 11.6* 11.2* 11.6*  11.9* 11.5*  HCT 36.4* 35.5* 34.0*  35.0* 34.5*  MCV 87.7 87.7  --  85.8  PLT 103* 117*  --  120*    Medications:     allopurinol  50 mg Oral Daily   apixaban  5 mg Oral BID   aspirin  81 mg Oral Daily   hydrALAZINE  100 mg Oral TID  insulin aspart  0-9 Units Subcutaneous TID WC   rosuvastatin  40 mg Oral Daily   senna-docusate  1 tablet Oral BID   sevelamer carbonate  800 mg Oral TID WC   sodium bicarbonate  650 mg Oral BID   sodium chloride flush  3 mL Intravenous Q12H   Tafamidis  1 capsule Oral Daily      Otelia Santee, MD 03/25/2022, 11:03 AM

## 2022-03-25 NOTE — Progress Notes (Signed)
Notified by CCMD that patient was now in a 3rd degree heart block. Upon assessment patient asymptomatic. Notified on call MD. STAT EKG ordered and completed. Vital signs WNL. Patient observed to be resting at this time.

## 2022-03-25 NOTE — Progress Notes (Signed)
PROGRESS NOTE    Corey [name]  XQJ:[id number] DOB: [birth year] DOA: 03/22/2022 PCP: Corey Rail, MD  76/M with history of atrial fibrillation on Eliquis, history of CVA, chronic systolic and diastolic CHF, EF 44-81%, cardiac amyloidosis, CKD 4-5, status post creation of left arm AV fistula on 9/19, chronic right leg wound followed at the wound center presented to the ED from cardiology clinic with worsening dyspnea on exertion, orthopnea, edema. -In the ED BNP was 1571, chest x-ray noted bilateral interstitial edema, BUN was 70, creatinine 4.5, EKG noted atrial flutter -Diuresed with IV Lasix with brisk response -Right heart cath 11/10: Low filling pressures, diuretics held  Subjective: -Feels better overall, breathing has improved  Assessment and Plan:  Acute on chronic combined systolic and diastolic heart failure  Known cardiac amyloidosis, positive genetic testing for ATTR amyloid -diuresed well w/ IV lasix, 7.5 L negative -repea ECHO w/ EF 30-35%, severe LVH -appreciate Renal input, has a left arm AV fistula placed 9/19 -RHC yesterday with low filling pressures, diuretics held -Nephrology following, hope to avoid dialysis this admission if possible, aVF not mature yet   Elevated troponin -could be due to demand, do not suspect ACS   AKI on chronic kidney disease (CKD), stage IV (severe) ([place]) Pt has left UE AVF that was placed in 01/31/2022. -Creatinine worse than baseline,  baseline around 3.5, possibly cardiorenal, holding diuretics -RHC noted low filling pressures -nephrology following, no plans for HD yet -Diuretics as noted above   Paroxysmal atrial fibrillation (Corey [name]) Currently in atrial flutter.  -Rate controlled. Continue with Eliquis.   Right lower leg chronic wound -Managed at the wound center and with home health RN, changed 11/9 by Corey [name] team   Hereditary cardiac amyloidosis (Corey [name]) On tafamidis.    S/P pericardial window creation -no effusion noted now    Diabetes (HCC) Stable.,  A1c is 5.7, continue sliding scale insulin   Essential hypertension Stable.    DVT prophylaxis: Eliquis Code Status: Full Code Family Communication: discussed with pt and wife Corey [name] previously Disposition Plan:   Consultants: Cards, renal   Procedures:   Antimicrobials:    Objective: Vitals:   03/25/22 0100 03/25/22 0603 03/25/22 0756 03/25/22 1007  BP: (!) 137/54 (!) 146/43 (!) 170/75 (!) 153/56  Pulse:      Resp: '17  14 17  '$ Temp:  99.3 F (37.4 C) 98.7 F (37.1 C)   TempSrc:  Oral Oral   SpO2:   99%   Weight:  73.3 kg    Height:        Intake/Output Summary (Last 24 hours) at 03/25/2022 1111 Last data filed at 03/25/2022 1008 Gross per 24 hour  Intake 1210 ml  Output 2650 ml  Net -1440 ml   Filed Weights   03/24/22 0449 03/24/22 0847 03/25/22 0603  Weight: 79.5 kg 76 kg 73.3 kg    Examination:  Gen: Awake, Alert, Oriented X 3,  HEENT: no JVD Lungs: Good air movement bilaterally, CTAB CVS: S1S2/RRR Abd: soft, Non tender, non distended, BS present Extremities: No edema, left arm AV fistula Skin: Wound on right lower extremity with dressing Psychiatry:  Mood & affect appropriate.     Data Reviewed:   CBC: Recent Labs  Lab 03/22/22 1659 03/24/22 0032 03/24/22 1428 03/25/22 0035  WBC 2.7* 2.7*  --  4.8  NEUTROABS 1.6*  --   --   --   HGB 11.6* 11.2* 11.6*  11.9* 11.5*  HCT 36.4* 35.5* 34.0*  35.0* 34.5*  MCV 87.7 87.7  --  85.8  PLT 103* 117*  --  564*   Basic Metabolic Panel: Recent Labs  Lab 03/22/22 1659 03/24/22 0032 03/24/22 1428 03/25/22 0035  NA 143 140 141  142 140  K 3.8 3.7 3.6  3.6 3.9  CL 113* 106  --  108  CO2 19* 21*  --  21*  GLUCOSE 78 113*  --  133*  BUN 70* 76*  --  78*  CREATININE 4.58* 4.93*  --  5.22*  CALCIUM 9.6 9.3  --  9.5  PHOS  --  6.7*  --   --    GFR: Estimated Creatinine Clearance: 11.4 mL/min (A) (by C-G formula based on SCr of 5.22 mg/dL (H)). Liver Function  Tests: No results for input(s): "AST", "ALT", "ALKPHOS", "BILITOT", "PROT", "ALBUMIN" in the last 168 hours. No results for input(s): "LIPASE", "AMYLASE" in the last 168 hours. No results for input(s): "AMMONIA" in the last 168 hours. Coagulation Profile: No results for input(s): "INR", "PROTIME" in the last 168 hours. Cardiac Enzymes: No results for input(s): "CKTOTAL", "CKMB", "CKMBINDEX", "TROPONINI" in the last 168 hours. BNP (last 3 results) Recent Labs    01/05/22 1125  PROBNP 1,740.0*   HbA1C: Recent Labs    03/23/22 0630  HGBA1C 5.7*   CBG: Recent Labs  Lab 03/24/22 0644 03/24/22 1533 03/24/22 1653 03/24/22 2122 03/25/22 0617  GLUCAP 89 65* 101* 152* 120*   Lipid Profile: Recent Labs    03/23/22 1615  CHOL 86  HDL 44  LDLCALC 31  TRIG 54  CHOLHDL 2.0   Thyroid Function Tests: No results for input(s): "TSH", "T4TOTAL", "FREET4", "T3FREE", "THYROIDAB" in the last 72 hours. Anemia Panel: Recent Labs    03/23/22 1615  VITAMINB12 848  FOLATE 14.1  FERRITIN 95  TIBC 272  IRON 52  RETICCTPCT 0.7   Urine analysis:    Component Value Date/Time   COLORURINE YELLOW 09/25/2020 2131   APPEARANCEUR CLEAR 09/25/2020 2131   LABSPEC 1.022 09/25/2020 2131   PHURINE 5.0 09/25/2020 2131   GLUCOSEU >=500 (A) 09/25/2020 2131   GLUCOSEU NEGATIVE 07/20/2014 1055   HGBUR NEGATIVE 09/25/2020 2131   HGBUR negative 08/28/2008 1446   BILIRUBINUR NEGATIVE 09/25/2020 2131   BILIRUBINUR Neg 10/28/2014 0916   KETONESUR NEGATIVE 09/25/2020 2131   PROTEINUR NEGATIVE 09/25/2020 2131   UROBILINOGEN 0.2 10/28/2014 0916   UROBILINOGEN 0.2 07/20/2014 1055   NITRITE NEGATIVE 09/25/2020 2131   LEUKOCYTESUR SMALL (A) 09/25/2020 2131   Sepsis Labs: '@LABRCNTIP'$ (procalcitonin:4,lacticidven:4)  )No results found for this or any previous visit (from the past 240 hour(s)).   Radiology Studies: CARDIAC CATHETERIZATION  Result Date: 03/24/2022 HEMODYNAMICS: RA:   5 mmHg (mean)  RV:   54/5 mmHg PA:   52/18 mmHg (31 mean) PCWP:  10-12 mmHg with V waves to 15    Estimated Fick CO/CI   5.4 L/min, 2.9 L/min/m2    PVR     3.7 Wood Units PAPi      >6  IMPRESSION: 1.Low left and right heart filling pressures. 2.PCWP V waves secondary to poor left atrial compliance. 3.Normal cardiac output/index; possibly elevated due to AV fistula. 4.Mildly elevated PVR. RECOMMENDATIONS: -Hold further diuretics   CT HEAD WO CONTRAST (5MM)  Result Date: 03/23/2022 CLINICAL DATA:  Acute neuro deficit. Slurred speech. Patient on dialysis. EXAM: CT HEAD WITHOUT CONTRAST TECHNIQUE: Contiguous axial images were obtained from the base of the skull through the vertex without intravenous contrast. RADIATION DOSE REDUCTION: This exam was  performed according to the departmental dose-optimization program which includes automated exposure control, adjustment of the mA and/or kV according to patient size and/or use of iterative reconstruction technique. COMPARISON:  CT head 09/19/2012.  MRI head with contrast 01/07/2015 FINDINGS: Brain: Extensive and diffuse dural ossification involving the falx and the dura around the cerebral hemispheres bilaterally. There is also involvement of the tentorium. This shows mild progression since 2008. There is diffuse dural enhancement on the prior MRI. No brain edema identified. Mild atrophy. Ventricle size normal. Mild white matter hypodensity bilaterally. Negative for acute infarct or hemorrhage. Vascular: Negative for hyperdense vessel Skull: No focal skeletal lesion. Sinuses/Orbits: Paranasal sinuses clear.  Left cataract extraction Other: None IMPRESSION: 1. No acute intracranial abnormality. Mild atrophy and mild chronic microvascular ischemic change in the white matter. 2. Extensive and diffuse dural ossification. This has progressed since 2008. Consider metabolic disorder. Correlate with serum calcium and parathyroid hormone level. Diffuse meningioma also possible. Recommend  follow-up MRI head without and with contrast for comparison with 2016. Electronically Signed   By: Franchot Gallo M.D.   On: 03/23/2022 15:14   ECHOCARDIOGRAM COMPLETE  Result Date: 03/23/2022    ECHOCARDIOGRAM REPORT   Patient Name:   Corey [name] Date of Exam: 03/23/2022 Medical Rec #:  [id number]    Height:       66.0 in Accession #:    [id number]   Weight:       178.0 lb Date of Birth:  28-Dec-1945    BSA:          1.903 m Patient Age:    76 years     BP:           136/122 mmHg Patient Gender: M            HR:           48 bpm. Exam Location:  Inpatient Procedure: 2D Echo, Cardiac Doppler, Color Doppler and Intracardiac            Opacification Agent Indications:    CHF-Acute Systolic L24.40  History:        Patient has prior history of Echocardiogram examinations, most                 recent 09/30/2020. CAD; Arrythmias:Bradycardia with first-degree                 block. Chronic systolic heart failure/cardiac amyloidosis.                 History of pericardial effusion s/p pericardial window.  Sonographer:    Darlina Sicilian RDCS Referring Phys: [place]  1. Speckled appearance to LV myocardium consistent with diagnosis of amyloid Global hypokinesis worse in the posterior lateral wall . Left ventricular ejection fraction, by estimation, is 30 to 35%. The left ventricle has moderately decreased function. The left ventricle demonstrates global hypokinesis. There is severe left ventricular hypertrophy. Left ventricular diastolic parameters are indeterminate. Elevated left ventricular end-diastolic pressure.  2. Right ventricular systolic function is normal. The right ventricular size is normal. There is moderately elevated pulmonary artery systolic pressure.  3. Left atrial size was moderately dilated.  4. Right atrial size was moderately dilated.  5. The mitral valve is normal in structure. Mild mitral valve regurgitation. No evidence of mitral stenosis.  6. The aortic valve is tricuspid. There  is mild calcification of the aortic valve. There is mild thickening of the aortic valve. Aortic valve regurgitation is not visualized. Aortic  valve sclerosis is present, with no evidence of aortic valve stenosis.  7. Aortic dilatation noted. There is moderate dilatation of the ascending aorta, measuring 41 mm.  8. The inferior vena cava is dilated in size with <50% respiratory variability, suggesting right atrial pressure of 15 mmHg. FINDINGS  Left Ventricle: Speckled appearance to LV myocardium consistent with diagnosis of amyloid Global hypokinesis worse in the posterior lateral wall. Left ventricular ejection fraction, by estimation, is 30 to 35%. The left ventricle has moderately decreased function. The left ventricle demonstrates global hypokinesis. Definity contrast agent was given IV to delineate the left ventricular endocardial borders. The left ventricular internal cavity size was normal in size. There is severe left ventricular hypertrophy. Left ventricular diastolic parameters are indeterminate. Elevated left ventricular end-diastolic pressure. Right Ventricle: The right ventricular size is normal. No increase in right ventricular wall thickness. Right ventricular systolic function is normal. There is moderately elevated pulmonary artery systolic pressure. The tricuspid regurgitant velocity is 2.86 m/s, and with an assumed right atrial pressure of 15 mmHg, the estimated right ventricular systolic pressure is 09.3 mmHg. Left Atrium: Left atrial size was moderately dilated. Right Atrium: Right atrial size was moderately dilated. Pericardium: There is no evidence of pericardial effusion. Mitral Valve: The mitral valve is normal in structure. Mild mitral valve regurgitation. No evidence of mitral valve stenosis. Tricuspid Valve: The tricuspid valve is normal in structure. Tricuspid valve regurgitation is mild . No evidence of tricuspid stenosis. Aortic Valve: The aortic valve is tricuspid. There is mild  calcification of the aortic valve. There is mild thickening of the aortic valve. Aortic valve regurgitation is not visualized. Aortic valve sclerosis is present, with no evidence of aortic valve stenosis. Pulmonic Valve: The pulmonic valve was normal in structure. Pulmonic valve regurgitation is mild. No evidence of pulmonic stenosis. Aorta: Aortic dilatation noted. There is moderate dilatation of the ascending aorta, measuring 41 mm. Venous: The inferior vena cava is dilated in size with less than 50% respiratory variability, suggesting right atrial pressure of 15 mmHg. IAS/Shunts: No atrial level shunt detected by color flow Doppler.  LEFT VENTRICLE PLAX 2D LVIDd:         4.70 cm      Diastology LVIDs:         3.60 cm      LV e' medial:    5.66 cm/s LV PW:         1.70 cm      LV E/e' medial:  18.9 LV IVS:        2.15 cm      LV e' lateral:   7.29 cm/s LVOT diam:     2.20 cm      LV E/e' lateral: 14.7 LV SV:         79 LV SV Index:   41 LVOT Area:     3.80 cm  LV Volumes (MOD) LV vol d, MOD A2C: 244.0 ml LV vol d, MOD A4C: 187.5 ml LV vol s, MOD A2C: 133.0 ml LV vol s, MOD A4C: 111.0 ml LV SV MOD A2C:     111.0 ml LV SV MOD A4C:     187.5 ml LV SV MOD BP:      92.3 ml RIGHT VENTRICLE RV S prime:     5.00 cm/s TAPSE (M-mode): 1.6 cm LEFT ATRIUM             Index        RIGHT ATRIUM  Index LA diam:        4.50 cm 2.36 cm/m   RA Area:     23.10 cm LA Vol (A2C):   87.3 ml 45.87 ml/m  RA Volume:   67.60 ml  35.52 ml/m LA Vol (A4C):   61.9 ml 32.52 ml/m LA Biplane Vol: 75.4 ml 39.62 ml/m  AORTIC VALVE             PULMONIC VALVE LVOT Vmax:   103.00 cm/s PR End Diast Vel: 8.88 msec LVOT Vmean:  76.600 cm/s LVOT VTI:    0.207 m  AORTA Ao Root diam: 2.90 cm Ao Asc diam:  4.05 cm MITRAL VALVE                TRICUSPID VALVE MV Area (PHT): 3.91 cm     TR Peak grad:   32.7 mmHg MV Decel Time: 194 msec     TR Vmax:        286.00 cm/s MV E velocity: 107.00 cm/s                             SHUNTS                              Systemic VTI:  0.21 m                             Systemic Diam: 2.20 cm Jenkins Rouge MD Electronically signed by Jenkins Rouge MD Signature Date/Time: 03/23/2022/2:24:17 PM    Final      Scheduled Meds:  allopurinol  50 mg Oral Daily   apixaban  5 mg Oral BID   aspirin  81 mg Oral Daily   hydrALAZINE  100 mg Oral TID   insulin aspart  0-9 Units Subcutaneous TID WC   rosuvastatin  40 mg Oral Daily   senna-docusate  1 tablet Oral BID   sevelamer carbonate  800 mg Oral TID WC   sodium bicarbonate  650 mg Oral BID   sodium chloride flush  3 mL Intravenous Q12H   Tafamidis  1 capsule Oral Daily   Continuous Infusions:  sodium chloride       LOS: 2 days    Time spent: 63mn    PDomenic Polite MD [facility]   03/25/2022, 11:11 AM

## 2022-03-26 ENCOUNTER — Encounter (HOSPITAL_COMMUNITY): Payer: Self-pay | Admitting: Cardiology

## 2022-03-26 DIAGNOSIS — I5043 Acute on chronic combined systolic (congestive) and diastolic (congestive) heart failure: Secondary | ICD-10-CM | POA: Diagnosis not present

## 2022-03-26 LAB — BASIC METABOLIC PANEL
Anion gap: 11 (ref 5–15)
BUN: 80 mg/dL — ABNORMAL HIGH (ref 8–23)
CO2: 23 mmol/L (ref 22–32)
Calcium: 9.3 mg/dL (ref 8.9–10.3)
Chloride: 105 mmol/L (ref 98–111)
Creatinine, Ser: 5.26 mg/dL — ABNORMAL HIGH (ref 0.61–1.24)
GFR, Estimated: 11 mL/min — ABNORMAL LOW (ref >60)
Glucose, Bld: 96 mg/dL (ref 70–99)
Potassium: 3.4 mmol/L — ABNORMAL LOW (ref 3.5–5.1)
Sodium: 139 mmol/L (ref 135–145)

## 2022-03-26 LAB — GLUCOSE, CAPILLARY
Glucose-Capillary: 85 mg/dL (ref 70–99)
Glucose-Capillary: 192 mg/dL — ABNORMAL HIGH (ref 70–99)
Glucose-Capillary: 118 mg/dL — ABNORMAL HIGH (ref 70–99)
Glucose-Capillary: 115 mg/dL — ABNORMAL HIGH (ref 70–99)

## 2022-03-26 LAB — PTH, INTACT AND CALCIUM
Calcium, Total (PTH): 9.4 mg/dL (ref 8.6–10.2)
PTH: 77 pg/mL — ABNORMAL HIGH (ref 15–65)

## 2022-03-26 LAB — SARS CORONAVIRUS 2 BY RT PCR: SARS Coronavirus 2 by RT PCR: NEGATIVE

## 2022-03-26 MED ORDER — SODIUM CHLORIDE 0.9 % IV SOLN: 500.0000 mL | Freq: Once | INTRAVENOUS | Status: DC

## 2022-03-26 MED ORDER — POLYETHYLENE GLYCOL 3350 17 G PO PACK
17.0000 g | PACK | Freq: Every day | ORAL | Status: DC
  Administered 2022-03-26 – 2022-03-29 (×4): 17 g via ORAL
  Filled 2022-03-26 (×4): qty 1

## 2022-03-26 MED ORDER — POTASSIUM CHLORIDE CRYS ER 20 MEQ PO TBCR
40.0000 meq | EXTENDED_RELEASE_TABLET | Freq: Once | ORAL | Status: AC
  Administered 2022-03-26: 40 meq via ORAL
  Filled 2022-03-26: qty 2

## 2022-03-26 NOTE — Progress Notes (Addendum)
PROGRESS NOTE    Corey [name]  NWG:[id number] DOB: 26-Aug-1945 DOA: 03/22/2022 PCP: [name], MD  75/M with history of atrial fibrillation on Eliquis, history of CVA, chronic systolic and diastolic CHF, EF 57-84%, cardiac amyloidosis, CKD 4-5, status post creation of left arm AV fistula on 9/19, chronic right leg wound followed at the wound center presented to the ED from cardiology clinic with worsening dyspnea on exertion, orthopnea, edema. -In the ED BNP was 1571, chest x-ray noted bilateral interstitial edema, BUN was 70, creatinine 4.5, EKG noted atrial flutter -Diuresed with IV Lasix with brisk response -Right heart cath 11/10: Low filling pressures, diuretics held -11/12 low-grade fevers overnight  Subjective: -Feels okay overall, low-grade temperature  Assessment and Plan:  Acute on chronic combined systolic and diastolic heart failure  Known cardiac amyloidosis, positive genetic testing for ATTR amyloid -diuresed well w/ IV lasix, 7.5 L negative -repea ECHO w/ EF 30-35%, severe LVH -appreciate Renal input, has a left arm AV fistula placed 9/19 -RHC with low filling pressures, diuretics held -Nephrology following, hope to avoid dialysis this admission if possible, aVF not mature yet -Giving gentle IVF back today   Elevated troponin -could be due to demand, do not suspect ACS   AKI on chronic kidney disease (CKD), stage IV (severe) ([place]) Pt has left UE AVF that was placed in 01/31/2022. -Creatinine worse than baseline,  baseline around 3.5, possibly cardiorenal, holding diuretics -RHC noted low filling pressures -nephrology following, no plans for HD yet -Diuretics held as noted above  Fever, 11/11-12 -No overt symptoms, small wound on right lower leg with clean base granulation tissue, does not appear infected, check flu and blood cultures, procalcitonin, monitor clinically   Paroxysmal atrial fibrillation (HCC) Currently in atrial flutter.  -Rate controlled.  Continue with Eliquis.   Right lower leg chronic wound -Managed at the wound center and with home health RN, changed 11/9 by [place] team   Hereditary cardiac amyloidosis ([place]) On tafamidis.    S/P pericardial window creation -no effusion noted now   Diabetes (HCC) Stable.,  A1c is 5.7, continue sliding scale insulin   Essential hypertension Stable.    DVT prophylaxis: Eliquis Code Status: Full Code Family Communication: discussed with pt and wife [name] previously Disposition Plan: Home likely 48 hours if stable  Consultants: Cards, renal   Procedures:   Antimicrobials:    Objective: Vitals:   03/26/22 0700 03/26/22 0742 03/26/22 0852 03/26/22 0857  BP:  133/77 125/66   Pulse:  65 (!) 54   Resp:  16 (!) 21 20  Temp: 100.3 F (37.9 C) 99.2 F (37.3 C) (!) 100.6 F (38.1 C)   TempSrc: Oral Oral Oral   SpO2:  97% 93%   Weight:      Height:        Intake/Output Summary (Last 24 hours) at 03/26/2022 1048 Last data filed at 03/26/2022 6962 Gross per 24 hour  Intake 1200 ml  Output 1950 ml  Net -750 ml   Filed Weights   03/24/22 0847 03/25/22 0603 03/26/22 0359  Weight: 76 kg 73.3 kg 79.5 kg    Examination:  Gen: AAOx3, no distress HEENT: No JVD CVS: S1-S2, regular rhythm Lungs: Clear bilaterally Abdomen: Soft, nontender, bowel sounds present Extremities: Left arm AV fistula Skin: Right lower leg with dime sized wound, pinkish-red granulation tissue, no purulence or drainage  Psychiatry:  Mood & affect appropriate.     Data Reviewed:   CBC: Recent Labs  Lab 03/22/22 1659  03/24/22 0032 03/24/22 1428 03/25/22 0035  WBC 2.7* 2.7*  --  4.8  NEUTROABS 1.6*  --   --   --   HGB 11.6* 11.2* 11.6*  11.9* 11.5*  HCT 36.4* 35.5* 34.0*  35.0* 34.5*  MCV 87.7 87.7  --  85.8  PLT 103* 117*  --  630*   Basic Metabolic Panel: Recent Labs  Lab 03/22/22 1659 03/24/22 0032 03/24/22 1428 03/25/22 0035 03/26/22 0024  NA 143 140 141  142 140 139   K 3.8 3.7 3.6  3.6 3.9 3.4*  CL 113* 106  --  108 105  CO2 19* 21*  --  21* 23  GLUCOSE 78 113*  --  133* 96  BUN 70* 76*  --  78* 80*  CREATININE 4.58* 4.93*  --  5.22* 5.26*  CALCIUM 9.6 9.3  9.4  --  9.5 9.3  PHOS  --  6.7*  --   --   --    GFR: Estimated Creatinine Clearance: 12.4 mL/min (A) (by C-G formula based on SCr of 5.26 mg/dL (H)). Liver Function Tests: No results for input(s): "AST", "ALT", "ALKPHOS", "BILITOT", "PROT", "ALBUMIN" in the last 168 hours. No results for input(s): "LIPASE", "AMYLASE" in the last 168 hours. No results for input(s): "AMMONIA" in the last 168 hours. Coagulation Profile: No results for input(s): "INR", "PROTIME" in the last 168 hours. Cardiac Enzymes: No results for input(s): "CKTOTAL", "CKMB", "CKMBINDEX", "TROPONINI" in the last 168 hours. BNP (last 3 results) Recent Labs    01/05/22 1125  PROBNP 1,740.0*   HbA1C: No results for input(s): "HGBA1C" in the last 72 hours.  CBG: Recent Labs  Lab 03/25/22 0617 03/25/22 1144 03/25/22 1647 03/25/22 2118 03/26/22 0611  GLUCAP 120* 168* 161* 100* 85   Lipid Profile: Recent Labs    03/23/22 1615  CHOL 86  HDL 44  LDLCALC 31  TRIG 54  CHOLHDL 2.0   Thyroid Function Tests: No results for input(s): "TSH", "T4TOTAL", "FREET4", "T3FREE", "THYROIDAB" in the last 72 hours. Anemia Panel: Recent Labs    03/23/22 1615  VITAMINB12 848  FOLATE 14.1  FERRITIN 95  TIBC 272  IRON 52  RETICCTPCT 0.7   Urine analysis:    Component Value Date/Time   COLORURINE YELLOW 09/25/2020 2131   APPEARANCEUR CLEAR 09/25/2020 2131   LABSPEC 1.022 09/25/2020 2131   PHURINE 5.0 09/25/2020 2131   GLUCOSEU >=500 (A) 09/25/2020 2131   GLUCOSEU NEGATIVE 07/20/2014 1055   HGBUR NEGATIVE 09/25/2020 2131   HGBUR negative 08/28/2008 1446   BILIRUBINUR NEGATIVE 09/25/2020 2131   BILIRUBINUR Neg 10/28/2014 0916   KETONESUR NEGATIVE 09/25/2020 2131   PROTEINUR NEGATIVE 09/25/2020 2131   UROBILINOGEN  0.2 10/28/2014 0916   UROBILINOGEN 0.2 07/20/2014 1055   NITRITE NEGATIVE 09/25/2020 2131   LEUKOCYTESUR SMALL (A) 09/25/2020 2131   Sepsis Labs: '@LABRCNTIP'$ (procalcitonin:4,lacticidven:4)  )No results found for this or any previous visit (from the past 240 hour(s)).   Radiology Studies: CARDIAC CATHETERIZATION  Result Date: 03/24/2022 HEMODYNAMICS: RA:   5 mmHg (mean) RV:   54/5 mmHg PA:   52/18 mmHg (31 mean) PCWP:  10-12 mmHg with V waves to 15    Estimated Fick CO/CI   5.4 L/min, 2.9 L/min/m2    PVR     3.7 Wood Units PAPi      >6  IMPRESSION: 1.Low left and right heart filling pressures. 2.PCWP V waves secondary to poor left atrial compliance. 3.Normal cardiac output/index; possibly elevated due to AV fistula. 4.Mildly  elevated PVR. RECOMMENDATIONS: -Hold further diuretics     Scheduled Meds:  allopurinol  50 mg Oral Daily   apixaban  5 mg Oral BID   aspirin  81 mg Oral Daily   hydrALAZINE  100 mg Oral TID   insulin aspart  0-9 Units Subcutaneous TID WC   polyethylene glycol  17 g Oral Daily   rosuvastatin  40 mg Oral Daily   senna-docusate  1 tablet Oral BID   sevelamer carbonate  800 mg Oral TID WC   sodium bicarbonate  650 mg Oral BID   sodium chloride flush  3 mL Intravenous Q12H   Tafamidis  1 capsule Oral Daily   Continuous Infusions:  sodium chloride     sodium chloride       LOS: 3 days    Time spent: 58mn  PDomenic Polite MD [facility]   03/26/2022, 10:48 AM

## 2022-03-26 NOTE — Plan of Care (Signed)
Pt had temp of 100.6 this morning; PRN tylenol given, blood cultures and COVID swab obtained; COVID negative. Right leg wound dressing changed; per Broadus John MD, leave unna boots off. Pt received bath.  Problem: Education: Goal: Ability to describe self-care measures that may prevent or decrease complications (Diabetes Survival Skills Education) will improve Outcome: Progressing Goal: Individualized Educational Video(s) Outcome: Progressing   Problem: Coping: Goal: Ability to adjust to condition or change in health will improve Outcome: Progressing   Problem: Fluid Volume: Goal: Ability to maintain a balanced intake and output will improve Outcome: Progressing   Problem: Health Behavior/Discharge Planning: Goal: Ability to identify and utilize available resources and services will improve Outcome: Progressing Goal: Ability to manage health-related needs will improve Outcome: Progressing   Problem: Metabolic: Goal: Ability to maintain appropriate glucose levels will improve Outcome: Progressing   Problem: Nutritional: Goal: Maintenance of adequate nutrition will improve Outcome: Progressing Goal: Progress toward achieving an optimal weight will improve Outcome: Progressing   Problem: Skin Integrity: Goal: Risk for impaired skin integrity will decrease Outcome: Progressing   Problem: Tissue Perfusion: Goal: Adequacy of tissue perfusion will improve Outcome: Progressing   Problem: Education: Goal: Ability to demonstrate management of disease process will improve Outcome: Progressing Goal: Ability to verbalize understanding of medication therapies will improve Outcome: Progressing Goal: Individualized Educational Video(s) Outcome: Progressing   Problem: Activity: Goal: Capacity to carry out activities will improve Outcome: Progressing   Problem: Cardiac: Goal: Ability to achieve and maintain adequate cardiopulmonary perfusion will improve Outcome: Progressing    Problem: Education: Goal: Knowledge of General Education information will improve Description: Including pain rating scale, medication(s)/side effects and non-pharmacologic comfort measures Outcome: Progressing   Problem: Health Behavior/Discharge Planning: Goal: Ability to manage health-related needs will improve Outcome: Progressing   Problem: Clinical Measurements: Goal: Ability to maintain clinical measurements within normal limits will improve Outcome: Progressing Goal: Will remain free from infection Outcome: Progressing Goal: Diagnostic test results will improve Outcome: Progressing Goal: Respiratory complications will improve Outcome: Progressing Goal: Cardiovascular complication will be avoided Outcome: Progressing   Problem: Activity: Goal: Risk for activity intolerance will decrease Outcome: Progressing   Problem: Nutrition: Goal: Adequate nutrition will be maintained Outcome: Progressing   Problem: Coping: Goal: Level of anxiety will decrease Outcome: Progressing   Problem: Elimination: Goal: Will not experience complications related to bowel motility Outcome: Progressing Goal: Will not experience complications related to urinary retention Outcome: Progressing   Problem: Pain Managment: Goal: General experience of comfort will improve Outcome: Progressing   Problem: Safety: Goal: Ability to remain free from injury will improve Outcome: Progressing   Problem: Skin Integrity: Goal: Risk for impaired skin integrity will decrease Outcome: Progressing   Problem: Education: Goal: Understanding of CV disease, CV risk reduction, and recovery process will improve Outcome: Progressing Goal: Individualized Educational Video(s) Outcome: Progressing   Problem: Activity: Goal: Ability to return to baseline activity level will improve Outcome: Progressing   Problem: Cardiovascular: Goal: Ability to achieve and maintain adequate cardiovascular perfusion  will improve Outcome: Progressing Goal: Vascular access site(s) Level 0-1 will be maintained Outcome: Progressing   Problem: Health Behavior/Discharge Planning: Goal: Ability to safely manage health-related needs after discharge will improve Outcome: Progressing

## 2022-03-26 NOTE — Progress Notes (Addendum)
[facility] Progress Note   76 y.o. male with HTN, HLD, DM, CVA, A-fib, prostate CA, amyloidosis, combined systolic and diastolic CHF (EF 34-91% w/ gr 2 diastolic dysfn), PEA arrest, chronic pericardial effusion status post pericardial window, CKD stage IV followed by Dr. Royce Macadamia at [facility] p/w worsening shortness of breath and DOE. Recent baseline serum creatinine level seems to be around 3.3-3.9 on torsemide 20 mg 3 times a week at home.  He underwent left brachial artery to cephalic vein AV fistula creation by Dr. Donzetta Matters on 01/31/2022. In the ED, Cr 4.58, CO2 19, BNP 1571, troponin 856, hemoglobin 11.6.  The chest x-ray consistent with diffuse bilateral pulmonary edema. Brisk response after Lasix 20 mg IV and a dose of metolazone with urine output 1800 documented.   Assessment/ Plan:   1) Acute on chronic CKD stage IV with fluid overload w/ CKD thought to be due to diabetes and hypertension with recent baseline serum creatinine level around 3.3-3.9 followed by Dr. Harrie Jeans.  Received IV Lasix in ER with urine output documented around 1.8 L.  He is currently on room air. Already 7504 mL neg/ during hospitalization with worsening renal function.   Would like to try and avoid starting dialysis in the hospital unless absolutely necessary and the pt agrees. But if necessary he's willing to initiate hemodialysis during this admission.  No absolute indications and he is not uremic.  No urgent need for HD today; [place] 11/10 showed low filling pressures. Would continue holding diuresis given worsening renal function, increase fluid restriction to 1.5L (pt asking for water and may be that she is somewhat volume down now). Low fill pressure in the RA (currently already net neg 7504 mL.  2) Vascular dialysis access: Left upper extremity AV fistula is not mature yet; if he needs RRT will need a TC.   3) Acute on chronic combined CHF: Getting echocardiogram.  Initially diuresing with IV Lasix and  metolazone but onhold with worsening renal function and also low filling pressures on RHC. Cardiology is following.   4) Metabolic acidosis: Started on oral sodium bicarbonate just 1 tab BID.   5) Anemia of CKD: Hemoglobin at goal.  Continue to monitor.   6) CKD-MBD: Calcium acceptable, phosphorus 6.7 -> Renvela 1 TIDM  Subjective:   1.95L UOP overnight. Denies SOB/n/v/f/c/cp   Objective:   BP 125/66 (BP Location: Right Arm)   Pulse (!) 54   Temp (!) 100.6 F (38.1 C) (Oral)   Resp 20   Ht '5\' 6"'$  (1.676 m)   Wt 79.5 kg   SpO2 93%   BMI 28.29 kg/m   Intake/Output Summary (Last 24 hours) at 03/26/2022 1033 Last data filed at 03/26/2022 0959 Gross per 24 hour  Intake 1200 ml  Output 1950 ml  Net -750 ml   Weight change: 3.522 kg  Physical Exam: General exam: Appears calm and comfortable, normal RR  Respiratory system:  No  crackles, no wheezing, Cardiovascular system: S1 & S2 heard, RRR.  Left leg edema looks chronic.. Gastrointestinal system: Abdomen is nondistended, soft and nontender. Normal bowel sounds heard. Central nervous system: Alert and oriented. No focal neurological deficits. Extremities: Symmetric 5 x 5 power.  Edema in the left leg. Skin: No rashes, lesions or ulcers Psychiatry: Judgement and insight appear normal. Mood & affect appropriate.  Vascular dialysis access: Left upper extremity BCF, good thrill and bruit.    Imaging: CARDIAC CATHETERIZATION  Result Date: 03/24/2022 HEMODYNAMICS: RA:   5 mmHg (mean) RV:  54/5 mmHg PA:   52/18 mmHg (31 mean) PCWP:  10-12 mmHg with V waves to 15    Estimated Fick CO/CI   5.4 L/min, 2.9 L/min/m2    PVR     3.7 Wood Units PAPi      >6  IMPRESSION: 1.Low left and right heart filling pressures. 2.PCWP V waves secondary to poor left atrial compliance. 3.Normal cardiac output/index; possibly elevated due to AV fistula. 4.Mildly elevated PVR. RECOMMENDATIONS: -Hold further diuretics    Labs: BMET Recent Labs  Lab  03/22/22 1659 03/24/22 0032 03/24/22 1428 03/25/22 0035 03/26/22 0024  NA 143 140 141  142 140 139  K 3.8 3.7 3.6  3.6 3.9 3.4*  CL 113* 106  --  108 105  CO2 19* 21*  --  21* 23  GLUCOSE 78 113*  --  133* 96  BUN 70* 76*  --  78* 80*  CREATININE 4.58* 4.93*  --  5.22* 5.26*  CALCIUM 9.6 9.3  9.4  --  9.5 9.3  PHOS  --  6.7*  --   --   --    CBC Recent Labs  Lab 03/22/22 1659 03/24/22 0032 03/24/22 1428 03/25/22 0035  WBC 2.7* 2.7*  --  4.8  NEUTROABS 1.6*  --   --   --   HGB 11.6* 11.2* 11.6*  11.9* 11.5*  HCT 36.4* 35.5* 34.0*  35.0* 34.5*  MCV 87.7 87.7  --  85.8  PLT 103* 117*  --  120*    Medications:     allopurinol  50 mg Oral Daily   apixaban  5 mg Oral BID   aspirin  81 mg Oral Daily   hydrALAZINE  100 mg Oral TID   insulin aspart  0-9 Units Subcutaneous TID WC   polyethylene glycol  17 g Oral Daily   rosuvastatin  40 mg Oral Daily   senna-docusate  1 tablet Oral BID   sevelamer carbonate  800 mg Oral TID WC   sodium bicarbonate  650 mg Oral BID   sodium chloride flush  3 mL Intravenous Q12H   Tafamidis  1 capsule Oral Daily      Otelia Santee, MD 03/26/2022, 10:33 AM

## 2022-03-26 NOTE — Progress Notes (Addendum)
Rounding Note  PCP-Cardiologist: Candee Furbish, MD   Subjective:    He reports that he feels well today -- no complaints.  Objective:   Weight Range: 79.5 kg Body mass index is 28.29 kg/m.   Vital Signs:   Temp:  [98.7 F (37.1 C)-100.6 F (38.1 C)] 99.4 F (37.4 C) (11/12 1211) Pulse Rate:  [50-65] 50 (11/12 1211) Resp:  [16-26] 20 (11/12 1211) BP: (109-135)/(59-78) 109/69 (11/12 1211) SpO2:  [93 %-100 %] 97 % (11/12 1211) Weight:  [79.5 kg] 79.5 kg (11/12 0359) Last BM Date : 03/22/22  Weight change: Filed Weights   03/24/22 0847 03/25/22 0603 03/26/22 0359  Weight: 76 kg 73.3 kg 79.5 kg    Intake/Output:   Intake/Output Summary (Last 24 hours) at 03/26/2022 1312 Last data filed at 03/26/2022 1211 Gross per 24 hour  Intake 960 ml  Output 1750 ml  Net -790 ml      Physical Exam    General:  Comfortable at rest HEENT: Normal Neck: Supple. JVP 12 cm.  Carotids 2+ bilat; no bruits.  Cor: PMI nondisplaced. Irregular rhythm, brady. No rubs, gallops or murmurs. Lungs: Clear Abdomen: Soft, nontender, nondistended.  Extremities: No cyanosis, clubbing, rash, 1+ edema, + UNNA Neuro: Alert & orientedx3, cranial nerves grossly intact. moves all 4 extremities w/o difficulty. Affect pleasant   Telemetry   AF yesterday evening organizing to atrial flutter today.   Labs    CBC Recent Labs    03/24/22 0032 03/24/22 1428 03/25/22 0035  WBC 2.7*  --  4.8  HGB 11.2* 11.6*  11.9* 11.5*  HCT 35.5* 34.0*  35.0* 34.5*  MCV 87.7  --  85.8  PLT 117*  --  194*   Basic Metabolic Panel Recent Labs    03/24/22 0032 03/24/22 1428 03/25/22 0035 03/26/22 0024  NA 140   < > 140 139  K 3.7   < > 3.9 3.4*  CL 106  --  108 105  CO2 21*  --  21* 23  GLUCOSE 113*  --  133* 96  BUN 76*  --  78* 80*  CREATININE 4.93*  --  5.22* 5.26*  CALCIUM 9.3  9.4  --  9.5 9.3  PHOS 6.7*  --   --   --    < > = values in this interval not displayed.   Liver Function  Tests No results for input(s): "AST", "ALT", "ALKPHOS", "BILITOT", "PROT", "ALBUMIN" in the last 72 hours. No results for input(s): "LIPASE", "AMYLASE" in the last 72 hours. Cardiac Enzymes No results for input(s): "CKTOTAL", "CKMB", "CKMBINDEX", "TROPONINI" in the last 72 hours.  BNP: BNP (last 3 results) Recent Labs    03/22/22 1659  BNP 1,571.3*    ProBNP (last 3 results) Recent Labs    01/05/22 1125  PROBNP 1,740.0*     D-Dimer No results for input(s): "DDIMER" in the last 72 hours. Hemoglobin A1C No results for input(s): "HGBA1C" in the last 72 hours.  Fasting Lipid Panel Recent Labs    03/23/22 1615  CHOL 86  HDL 44  LDLCALC 31  TRIG 54  CHOLHDL 2.0   Thyroid Function Tests No results for input(s): "TSH", "T4TOTAL", "T3FREE", "THYROIDAB" in the last 72 hours.  Invalid input(s): "FREET3"  Other results:   Imaging    No results found.  ECG 11/8: atrial flutter, V-rate 51 bpm ECG 11/10: Sinus bradycardia with first degree AV block and blocked PACs  Medications:     Scheduled Medications:  allopurinol  50 mg Oral Daily   apixaban  5 mg Oral BID   aspirin  81 mg Oral Daily   hydrALAZINE  100 mg Oral TID   insulin aspart  0-9 Units Subcutaneous TID WC   polyethylene glycol  17 g Oral Daily   rosuvastatin  40 mg Oral Daily   senna-docusate  1 tablet Oral BID   sevelamer carbonate  800 mg Oral TID WC   sodium bicarbonate  650 mg Oral BID   sodium chloride flush  3 mL Intravenous Q12H   Tafamidis  1 capsule Oral Daily    Infusions:  sodium chloride      PRN Medications: sodium chloride, acetaminophen, ondansetron **OR** ondansetron (ZOFRAN) IV, ondansetron (ZOFRAN) IV, mouth rinse, phenol, sodium chloride flush    Patient Profile   76 y.o.  AAM  with w/ hx chronic pericardial effusion s/p pericardial window, cardiac amyloidosis, CAD, combined systolic CHF, prior PEA arrest, HTN, HLD, DM type 2, hx CVA, OSA, cognitive impairment,  paroxysmal a fib and prostate cancer.   Admitted from Cardiology office with a/c CHF and AKI on CKD. AHF team asked to see for a/c CHF in setting of cardiac amyloidosis.    Assessment/Plan   Chronic systolic heart failure/cardiac amyloidosis  -Pericardial effusion s/p pericardial window with myocardial biopsy 05/22. Biopsy came back positive for amyloidosis, biopsy suggested AL. Serum and urine studies however were not consistent with AL amyloidosis. 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  - Limited echo 09/30/20: EF 40%, trace pericardial fluid post window, severe LVH, RV moderately reduced with severely increased wall thickness - Echo this admit: EF 30-35%, severe LVH, RV okay, RVSP 48 mmHg, dilated IVC - RHC with PCWP 10-12 - NYHA class III on admission - Overdiuresed - Continue tafamidis  - Continue hydral 100 TID, previously on Imdur 60 daily (will not add back to allow BP for renal perfusion) - Limited benefit to GDMT in ATTR amyloid - UNNA boots  CAD  - 05/22 LHC for angina: 80% stenosis LAD noted. PCI aborted d/t marked volume overload and large pericardial effusion - HsTrop [phone] this admit, suspect demand ischemia. Denies angina. - Continue rosuvastatin, LDL 31 - Continue ASA with CAD    Paroxsymal atrial fibrillation/ atrial flutter - Found to be in a fib 8/23 - Appears to be in AFL with slow ventricular response, 50s. Hesitant to add amiodarone with bradycardia. Not a candidate for any antiarrhythmic drug due to bradycardia and CKD. - ambulate hall to assess for chronotropic competence. He may benefit from a pacemaker - though this should be considered carefully for a patient on dialysis due to increased infection risk and potential use of a dialysis access site. HR increases to 70 bpm just getting up to bedside. - Continue Eliquis 5 BID   Bradycardia: - in atrial flutter with slow ventricular rate. ECG from 12/2021 shows flutter with  V-rate of 45. ECG from 02/2021 with sinus bradycardia 50 bpm with PR of > 500 msec. - place 48 holter on discharge. Patient may follow-up with me to discuss possible pacemaker and rhythm control   Pericardial effusion s/p pericardial window  -S/p pericardial window due to large pericardial effusion in 05/22.   AKI on CKD stage IV - recently had AV fistula placed - baseline Scr 3.3-3.9, Scr this admit 4.58>4.93 - Nephrology following - Attempting to avoid HD this admit (fistula placed 01/2022)    We will sign off. Please call with questions.  Length of  Stay: [place], MD  03/26/2022, 1:12 PM

## 2022-03-27 ENCOUNTER — Inpatient Hospital Stay (HOSPITAL_COMMUNITY): Payer: Medicare HMO

## 2022-03-27 ENCOUNTER — Encounter (HOSPITAL_COMMUNITY): Payer: Self-pay | Admitting: Cardiology

## 2022-03-27 ENCOUNTER — Encounter (HOSPITAL_BASED_OUTPATIENT_CLINIC_OR_DEPARTMENT_OTHER): Payer: Medicare HMO | Admitting: Internal Medicine

## 2022-03-27 DIAGNOSIS — I5043 Acute on chronic combined systolic (congestive) and diastolic (congestive) heart failure: Secondary | ICD-10-CM | POA: Diagnosis not present

## 2022-03-27 LAB — BASIC METABOLIC PANEL
Anion gap: 12 (ref 5–15)
BUN: 85 mg/dL — ABNORMAL HIGH (ref 8–23)
CO2: 23 mmol/L (ref 22–32)
Calcium: 9.2 mg/dL (ref 8.9–10.3)
Chloride: 104 mmol/L (ref 98–111)
Creatinine, Ser: 5.34 mg/dL — ABNORMAL HIGH (ref 0.61–1.24)
GFR, Estimated: 11 mL/min — ABNORMAL LOW (ref >60)
Glucose, Bld: 102 mg/dL — ABNORMAL HIGH (ref 70–99)
Potassium: 3.8 mmol/L (ref 3.5–5.1)
Sodium: 139 mmol/L (ref 135–145)

## 2022-03-27 LAB — CBC
HCT: 30.7 % — ABNORMAL LOW (ref 39.0–52.0)
Hemoglobin: 9.9 g/dL — ABNORMAL LOW (ref 13.0–17.0)
MCH: 27.5 pg (ref 26.0–34.0)
MCHC: 32.2 g/dL (ref 30.0–36.0)
MCV: 85.3 fL (ref 80.0–100.0)
Platelets: 104 10*3/uL — ABNORMAL LOW (ref 150–400)
RBC: 3.6 MIL/uL — ABNORMAL LOW (ref 4.22–5.81)
RDW: 16 % — ABNORMAL HIGH (ref 11.5–15.5)
WBC: 4.7 10*3/uL (ref 4.0–10.5)
nRBC: 0 % (ref 0.0–0.2)

## 2022-03-27 LAB — GLUCOSE, CAPILLARY
Glucose-Capillary: 82 mg/dL (ref 70–99)
Glucose-Capillary: 104 mg/dL — ABNORMAL HIGH (ref 70–99)
Glucose-Capillary: 141 mg/dL — ABNORMAL HIGH (ref 70–99)
Glucose-Capillary: 169 mg/dL — ABNORMAL HIGH (ref 70–99)
Glucose-Capillary: 249 mg/dL — ABNORMAL HIGH (ref 70–99)

## 2022-03-27 LAB — PROCALCITONIN: Procalcitonin: 2.21 ng/mL

## 2022-03-27 LAB — SYNOVIAL CELL COUNT + DIFF, W/ CRYSTALS
Eosinophils-Synovial: 8 % — ABNORMAL HIGH (ref 0–1)
Lymphocytes-Synovial Fld: 5 % (ref 0–20)
Monocyte-Macrophage-Synovial Fluid: 13 % — ABNORMAL LOW (ref 50–90)
Neutrophil, Synovial: 74 % — ABNORMAL HIGH (ref 0–25)
WBC, Synovial: 420 /mm3 — ABNORMAL HIGH (ref 0–200)

## 2022-03-27 MED ORDER — METHYLPREDNISOLONE ACETATE 40 MG/ML IJ SUSP
40.0000 mg | Freq: Once | INTRAMUSCULAR | Status: AC
  Administered 2022-03-27: 40 mg via INTRA_ARTICULAR
  Filled 2022-03-27: qty 1

## 2022-03-27 MED ORDER — OXYCODONE HCL 5 MG PO TABS
5.0000 mg | ORAL_TABLET | Freq: Four times a day (QID) | ORAL | Status: DC | PRN
  Filled 2022-03-27: qty 1

## 2022-03-27 MED ORDER — DICLOFENAC SODIUM 1 % EX GEL
2.0000 g | Freq: Three times a day (TID) | CUTANEOUS | Status: DC | PRN
  Filled 2022-03-27: qty 100

## 2022-03-27 MED ORDER — OXYCODONE HCL 5 MG PO TABS
5.0000 mg | ORAL_TABLET | Freq: Once | ORAL | Status: AC
  Administered 2022-03-27: 5 mg via ORAL
  Filled 2022-03-27: qty 1

## 2022-03-27 MED ORDER — HYDRALAZINE HCL 25 MG PO TABS
25.0000 mg | ORAL_TABLET | Freq: Three times a day (TID) | ORAL | Status: DC
  Administered 2022-03-27 – 2022-03-29 (×6): 25 mg via ORAL
  Filled 2022-03-27 (×5): qty 1

## 2022-03-27 MED ORDER — BUPIVACAINE HCL (PF) 0.5 % IJ SOLN
10.0000 mL | Freq: Once | INTRAMUSCULAR | Status: AC
  Administered 2022-03-27: 10 mL
  Filled 2022-03-27: qty 10

## 2022-03-27 MED FILL — Heparin Sod (Porcine)-NaCl IV Soln 1000 Unit/500ML-0.9%: INTRAVENOUS | Qty: 500 | Status: AC

## 2022-03-27 NOTE — Progress Notes (Signed)
Advanced Heart Failure Rounding Note  PCP-Cardiologist: Candee Furbish, MD   Subjective:    Diuretics remain on hold. SCr 5.26>>5.34. BUN 85 . Still making urine. 1.4L in UOP yesterday. Nephrology following.   Denies dyspnea currently. Laying flat. No orthopnea/PND. Daily wts unreliable.   Remains in AFL w/ SVR 50s-60s.   Febrile yesterday, mTemp 100.6. PCT 2.21 but also in setting of stage IV CKD. WBC 4.7. Complains of mild rt knee pain. No other infectious symptoms.   [place] 03/24/22 HEMODYNAMICS: RA:                  5 mmHg (mean) RV:                  54/5 mmHg PA:                  52/18 mmHg (31 mean) PCWP:            10-12 mmHg with V waves to 15                                      Estimated Fick CO/CI   5.4 L/min, 2.9 L/min/m2                                              PVR                 3.7 Wood Units  PAPi                >6       IMPRESSION: 1.Low left and right heart filling pressures.  2.PCWP V waves secondary to poor left atrial compliance.  3.Normal cardiac output/index; possibly elevated due to AV fistula.  4.Mildly elevated PVR.  Objective:   Weight Range: 71 kg Body mass index is 25.26 kg/m.   Vital Signs:   Temp:  [98.4 F (36.9 C)-100.6 F (38.1 C)] 98.4 F (36.9 C) (11/13 0438) Pulse Rate:  [50-65] 56 (11/13 0002) Resp:  [16-24] 17 (11/13 0438) BP: (109-133)/(66-77) 118/72 (11/13 0438) SpO2:  [93 %-100 %] 100 % (11/13 0438) Weight:  [71 kg] 71 kg (11/13 0614) Last BM Date : 03/22/22  Weight change: Filed Weights   03/25/22 0603 03/26/22 0359 03/27/22 0614  Weight: 73.3 kg 79.5 kg 71 kg    Intake/Output:   Intake/Output Summary (Last 24 hours) at 03/27/2022 0719 Last data filed at 03/26/2022 2346 Gross per 24 hour  Intake 1200 ml  Output 1400 ml  Net -200 ml      Physical Exam    General:  elderly thin appearing male. No resp difficulty HEENT: Normal Neck: Supple. JVP 10 cm . Carotids 2+ bilat; no bruits. No lymphadenopathy  or thyromegaly appreciated. Cor: PMI nondisplaced. Irregular rhythm, slow rate. No rubs, gallops or murmurs. Lungs: Clear Abdomen: Soft, nontender, nondistended. No hepatosplenomegaly. No bruits or masses. Good bowel sounds. Extremities: No cyanosis, clubbing, rash, edema Neuro: Alert & orientedx3, cranial nerves grossly intact. moves all 4 extremities w/o difficulty. Affect pleasant   Telemetry   AFL 50s-60s   EKG    No new EKG to review   Labs    CBC Recent Labs    03/25/22 0035 03/27/22 0038  WBC 4.8 4.7  HGB 11.5* 9.9*  HCT 34.5* 30.7*  MCV 85.8 85.3  PLT 120* 580*   Basic Metabolic Panel Recent Labs    03/26/22 0024 03/27/22 0038  NA 139 139  K 3.4* 3.8  CL 105 104  CO2 23 23  GLUCOSE 96 102*  BUN 80* 85*  CREATININE 5.26* 5.34*  CALCIUM 9.3 9.2   Liver Function Tests No results for input(s): "AST", "ALT", "ALKPHOS", "BILITOT", "PROT", "ALBUMIN" in the last 72 hours. No results for input(s): "LIPASE", "AMYLASE" in the last 72 hours. Cardiac Enzymes No results for input(s): "CKTOTAL", "CKMB", "CKMBINDEX", "TROPONINI" in the last 72 hours.  BNP: BNP (last 3 results) Recent Labs    03/22/22 1659  BNP 1,571.3*    ProBNP (last 3 results) Recent Labs    01/05/22 1125  PROBNP 1,740.0*     D-Dimer No results for input(s): "DDIMER" in the last 72 hours. Hemoglobin A1C No results for input(s): "HGBA1C" in the last 72 hours. Fasting Lipid Panel No results for input(s): "CHOL", "HDL", "LDLCALC", "TRIG", "CHOLHDL", "LDLDIRECT" in the last 72 hours. Thyroid Function Tests No results for input(s): "TSH", "T4TOTAL", "T3FREE", "THYROIDAB" in the last 72 hours.  Invalid input(s): "FREET3"  Other results:   Imaging    No results found.   Medications:     Scheduled Medications:  allopurinol  50 mg Oral Daily   apixaban  5 mg Oral BID   aspirin  81 mg Oral Daily   hydrALAZINE  100 mg Oral TID   insulin aspart  0-9 Units Subcutaneous TID WC    polyethylene glycol  17 g Oral Daily   rosuvastatin  40 mg Oral Daily   senna-docusate  1 tablet Oral BID   sevelamer carbonate  800 mg Oral TID WC   sodium bicarbonate  650 mg Oral BID   sodium chloride flush  3 mL Intravenous Q12H   Tafamidis  1 capsule Oral Daily    Infusions:  sodium chloride      PRN Medications: sodium chloride, acetaminophen, diclofenac Sodium, ondansetron **OR** ondansetron (ZOFRAN) IV, ondansetron (ZOFRAN) IV, mouth rinse, phenol, sodium chloride flush    Patient Profile   76 y.o.  AAM  with w/ hx chronic pericardial effusion s/p pericardial window, cardiac amyloidosis, CAD, combined systolic and diastolic CHF, prior PEA arrest, HTN, HLD, DM type 2, hx CVA, OSA, cognitive impairment, paroxysmal a fib and prostate cancer.    Admitted from Cardiology office with a/c CHF and AKI on CKD. AHF team asked to see for a/c CHF in setting of cardiac amyloidosis.  Assessment/Plan    Chronic systolic heart failure/cardiac amyloidosis  -Pericardial effusion s/p pericardial window with myocardial biopsy 05/22. Biopsy came back positive for amyloidosis, biopsy suggested AL. Serum and urine studies however were not consistent with AL amyloidosis. 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  - Limited echo 09/30/20: EF 40%, trace pericardial fluid post window, severe LVH, RV moderately reduced with severely increased wall thickness - Echo this admit: EF 30-35%, severe LVH, RV okay, RVSP 48 mmHg, dilated IVC - RHC with PCWP 10-12, RA 5  - NYHA class III on admission - Initially over diuresed. Diuretics on hold. Will need outpatient plan in place to keep euvolemic. May be nearing need for iHD. Nephrology following to guide decision.  - Continue tafamidis  - Continue hydral 100 TID, previously on Imdur 60 daily (will not add back to allow BP for renal perfusion) - Limited benefit to GDMT in ATTR amyloid    CAD  -  05/22 LHC for angina: 80%  stenosis LAD noted. PCI aborted d/t marked volume overload and large pericardial effusion - HsTrop [phone] this admit, suspect demand ischemia. Denies angina. - Continue rosuvastatin, LDL 31 - Continue ASA with CAD    Paroxsymal atrial fibrillation/ atrial flutter - Found to be in a fib 8/23 - Appears to be in AFL with slow ventricular response, 50s. Hesitant to add amiodarone with bradycardia. Not a candidate for any antiarrhythmic drug due to bradycardia and CKD. - ambulate hall to assess for chronotropic competence. He may benefit from a pacemaker - though this should be considered carefully for a patient on dialysis due to increased infection risk and potential use of a dialysis access site. HR increases to 70 bpm just getting up to bedside. - Continue Eliquis 5 BID   Bradycardia: - in atrial flutter with slow ventricular rate. ECG from 12/2021 shows flutter with V-rate of 45. ECG from 02/2021 with sinus bradycardia 50 bpm with PR of > 500 msec. - place 48 holter on discharge. Patient can follow-up with EP to discuss possible pacemaker and rhythm control (Dr. Myles Gip)     Pericardial effusion s/p pericardial window  -S/p pericardial window due to large pericardial effusion in 05/22.    AKI on CKD stage IV - recently had AV fistula placed - baseline Scr 3.3-3.9, Scr this admit 4.58>4.93>>5.26>>5.34 - non oliguric  - Nephrology following - Fistula placed 01/2022) - Would like to try and avoid starting dialysis in the hospital unless absolutely necessary. No current indication        Length of Stay: [address] PA-C  03/27/2022, 7:19 AM  Advanced Heart Failure Team Pager [phone] (M-F; 7a - 5p)  Please contact [facility] for night-coverage after hours (5p -7a ) and weekends on [email or web address]

## 2022-03-27 NOTE — Procedures (Signed)
Procedure: Right knee aspiration and injection   Indication: Right knee effusion(s)   Surgeon: Silvestre Gunner, PA-C   Assist: None   Anesthesia: Topical refrigerant   EBL: None   Complications: None   Findings: After risks/benefits explained patient desires to undergo procedure. Consent obtained and time out performed. The right knee was sterilely prepped and aspirated. 66m clear yellow fluid obtained. '40mg'$  depomedrol and 648m0.5% Marcaine instilled. Pt tolerated the procedure well.       MiLisette AbuPA-C Orthopedic Surgery [phone]

## 2022-03-27 NOTE — Consult Note (Signed)
   [facility] Inpatient Consult   03/27/2022  Corey [name] 12-01-1945 [id number]   [place] [facility] [ACO] Patient: [name] Medicare  Primary Care Provider: [name], MD with [place] [facility]   Patient screened for hospitalization and to assess for potential [name] Management service needs for post hospital transition for care coordination.  Review of patient's electronic medical record reveals patient is post recent procedure and on rounding staff in room with patient.  Patient has had [place] [facility] services in the past.  Plan:  Continue to follow progress and disposition to assess for post hospital community care coordination/management needs.  Referral request for community care coordination:  Of note, [name] does not replace or interfere with any arrangements made by the Inpatient Transition of Care team.  For questions contact:   Natividad Brood, RN BSN [place]  [phone] business mobile phone Toll free office [phone]  *[place]  [phone] Fax number: [phone] [email or web address]

## 2022-03-27 NOTE — Progress Notes (Signed)
Per monitor in room, patient has a rhythm of sinus bradycardia. Observed patient for approximately 15 minutes as well as obtained his vital signs. His heart rate dropped as low as 42 several times. His respirations fluctuated as 24 RPM. However, patient's breathing pattern remained regular and unlabored. Patient's oxygen saturation level ranged between 98%-100% on room air. Patient's blood pressure was 118/72. Patient's 1600 dose of Hydralazine was placed on hold by attending physician. Charge nurse was also made aware of patient's present status.

## 2022-03-27 NOTE — Progress Notes (Signed)
Pt c/o right knee pain 8/10 started gradually tonight. Reports history of intermittent right knee pain for months; does not recall ever having gout in knee. Slight swelling noted to medial knee. Pt able to bed knee w/o difficulty. Noted temp 100.5; denies feeling feverish.  PRN tylenol given. Temp improved but knee pain unchanged and keeping pt awake. Night MD notified and '5mg'$  oxycodone given and pt able to sleep and in morning reports improvement in knee pain.

## 2022-03-27 NOTE — Consult Note (Signed)
Reason for Consult:Right knee pain Referring Physician: Domenic Polite Time called: 1003 Time at bedside: Corey [name] is an 76 y.o. male.  HPI: Corey [name] was admitted with CHF late last week. A couple of days ago he began to have right knee pain. He denies any antecedent event. He has a history of knee pain caused by gout. The pain is mild and he is still able to ambulate.  Past Medical History:  Diagnosis Date   Cataract    CHF (congestive heart failure) (HCC)    Chronic heart failure with preserved ejection fraction (HFpEF) (HCC)    Colon polyp 2003   Dr [name]; F/U was to be 2008( not completed)   Coronary artery disease    CVA (cerebral infarction) 2014   Diabetes mellitus    Diverticulosis 2003   Dyspnea    with exertion   Gout    Hereditary cardiac amyloidosis (HCC)    Hypertension    Myocardial infarction ([place]) 09/19/2012   PEA cardiac arrest in setting of acute respiratory failure/pulmonary edema   OSA (obstructive sleep apnea) 03/27/2018   Pneumonia 09/29/2011   Avelox X 10 days as OP   Prostate cancer ([place])    Renal insufficiency    Seizures ([place]) 09/19/2012   not treated for seizure disorder; had a seizure after stroke 2014; no seizure since then   Stroke [facility])     Past Surgical History:  Procedure Laterality Date   AV FISTULA PLACEMENT Left 01/31/2022   Procedure: LEFT ARM ARTERIOVENOUS (AV) FISTULA CREATION;  Surgeon: Waynetta Sandy, MD;  Location: [place];  Service: Vascular;  Laterality: Left;  regional block to left arm   CARDIOVERSION N/A 03/24/2022   Procedure: CARDIOVERSION;  Surgeon: Hebert Soho, DO;  Location: [facility];  Service: Cardiovascular;  Laterality: N/A;   COLONOSCOPY W/ POLYPECTOMY  2003   no F/U ([place] discussed 12/03/12)   INGUINAL HERNIA REPAIR Left 06/16/2021   Procedure: LAPAROSCOPIC, POSSIBLY OPEN LEFT INGUINAL HERNIA REPAIR WITH MESH;  Surgeon: Felicie Morn, MD;  Location: [facility];  Service: General;   Laterality: Left;   INSERTION OF MESH N/A 07/16/2017   Procedure: INSERTION OF MESH;  Surgeon: [name], MD;  Location: [place] [facility] OR;  Service: General;  Laterality: N/A;   PEG PLACEMENT  2014   10/08/12-12/10/12   PEG TUBE REMOVAL  2014   PROSTATE BIOPSY     RIGHT HEART CATH N/A 03/24/2022   Procedure: RIGHT HEART CATH;  Surgeon: Hebert Soho, DO;  Location: [facility] CV LAB;  Service: Cardiovascular;  Laterality: N/A;   RIGHT/LEFT HEART CATH AND CORONARY ANGIOGRAPHY N/A 09/20/2020   Procedure: RIGHT/LEFT HEART CATH AND CORONARY ANGIOGRAPHY;  Surgeon: Leonie Man, MD;  Location: [facility] [facility];  Service: Cardiovascular;  Laterality: N/A;   TEE WITHOUT CARDIOVERSION N/A 09/27/2020   Procedure: TRANSESOPHAGEAL ECHOCARDIOGRAM (TEE);  Surgeon: Wonda Olds, MD;  Location: [facility] OR;  Service: Thoracic;  Laterality: N/A;   TEE WITHOUT CARDIOVERSION N/A 03/24/2022   Procedure: TRANSESOPHAGEAL ECHOCARDIOGRAM (TEE);  Surgeon: Hebert Soho, DO;  Location: [facility] [facility];  Service: Cardiovascular;  Laterality: N/A;   TRACHEOSTOMY  2014   09/30/12-10/20/12   VENTRAL HERNIA REPAIR N/A 07/16/2017   Procedure: LAPAROSCOPIC VENTRAL HERNIA REPAIR WITH MESH;  Surgeon: [name], MD;  Location: [place];  Service: General;  Laterality: N/A;   wrist aspiration  02/16/2012    monosodium urate crystals; Dr Caralyn Guile    Family History  Problem Relation  Age of Onset   Heart failure Mother    Hypertension Mother    Diabetes Mother    Hypertension Father    Prostate cancer Father    Diabetes Father    Cancer Father    Heart failure Brother    Stroke Neg Hx    Heart disease Neg Hx    Colon cancer Neg Hx     Social History:  reports that he quit smoking about 53 years ago. His smoking use included cigarettes. He has a 0.25 pack-year smoking history. He has never used smokeless tobacco. He reports that he does not drink alcohol and does not use drugs.  Allergies:  Allergies   Allergen Reactions   Hydrochlorothiazide Other (See Comments)    Gout , uncontrolled diabetes and renal insufficiency    Medications: I have reviewed the patient's current medications.  Results for orders placed or performed during the hospital encounter of 03/22/22 (from the past 48 hour(s))  Glucose, capillary     Status: Abnormal   Collection Time: 03/25/22 11:44 AM  Result Value Ref Range   Glucose-Capillary 168 (H) 70 - 99 mg/dL    Comment: Glucose reference range applies only to samples taken after fasting for at least 8 hours.  Glucose, capillary     Status: Abnormal   Collection Time: 03/25/22  4:47 PM  Result Value Ref Range   Glucose-Capillary 161 (H) 70 - 99 mg/dL    Comment: Glucose reference range applies only to samples taken after fasting for at least 8 hours.  Glucose, capillary     Status: Abnormal   Collection Time: 03/25/22  9:18 PM  Result Value Ref Range   Glucose-Capillary 100 (H) 70 - 99 mg/dL    Comment: Glucose reference range applies only to samples taken after fasting for at least 8 hours.   Comment 1 Notify RN    Comment 2 Document in Chart   Basic metabolic panel     Status: Abnormal   Collection Time: 03/26/22 12:24 AM  Result Value Ref Range   Sodium 139 135 - 145 mmol/L   Potassium 3.4 (L) 3.5 - 5.1 mmol/L   Chloride 105 98 - 111 mmol/L   CO2 23 22 - 32 mmol/L   Glucose, Bld 96 70 - 99 mg/dL    Comment: Glucose reference range applies only to samples taken after fasting for at least 8 hours.   BUN 80 (H) 8 - 23 mg/dL   Creatinine, Ser 5.26 (H) 0.61 - 1.24 mg/dL   Calcium 9.3 8.9 - 10.3 mg/dL   GFR, Estimated 11 (L) >60 mL/min    Comment: (NOTE) Calculated using the CKD-EPI Creatinine Equation (2021)    Anion gap 11 5 - 15    Comment: Performed at [facility] [address], [place], [place] [address]  Glucose, capillary     Status: None   Collection Time: 03/26/22  6:11 AM  Result Value Ref Range   Glucose-Capillary 85 70 - 99  mg/dL    Comment: Glucose reference range applies only to samples taken after fasting for at least 8 hours.   Comment 1 Notify RN    Comment 2 Document in Chart   SARS Coronavirus 2 by RT PCR (hospital order, performed in [facility] hospital lab) *cepheid single result test* Anterior Nasal Swab     Status: None   Collection Time: 03/26/22  9:18 AM   Specimen: Anterior Nasal Swab  Result Value Ref Range   SARS  Coronavirus 2 by RT PCR NEGATIVE NEGATIVE    Comment: (NOTE) SARS-CoV-2 target nucleic acids are NOT DETECTED.  The SARS-CoV-2 RNA is generally detectable in upper and lower respiratory specimens during the acute phase of infection. The lowest concentration of SARS-CoV-2 viral copies this assay can detect is 250 copies / mL. A negative result does not preclude SARS-CoV-2 infection and should not be used as the sole basis for treatment or other patient management decisions.  A negative result may occur with improper specimen collection / handling, submission of specimen other than nasopharyngeal swab, presence of viral mutation(s) within the areas targeted by this assay, and inadequate number of viral copies (<250 copies / mL). A negative result must be combined with clinical observations, patient history, and epidemiological information.  Fact Sheet for Patients:   https://www.patel.info/  Fact Sheet for Healthcare Providers: https://hall.com/  This test is not yet approved or  cleared by the [place] FDA and has been authorized for detection and/or diagnosis of SARS-CoV-2 by FDA under an Emergency Use Authorization (EUA).  This EUA will remain in effect (meaning this test can be used) for the duration of the COVID-19 declaration under Section 564(b)(1) of the Act, 21 U.S.C. section 360bbb-3(b)(1), unless the authorization is terminated or revoked sooner.  Performed at [facility], [place] [address], [place],  [place] [address]   Culture, blood (Routine X 2) w Reflex to ID Panel     Status: None (Preliminary result)   Collection Time: 03/26/22  9:50 AM   Specimen: BLOOD  Result Value Ref Range   Specimen Description BLOOD BLOOD RIGHT ARM    Special Requests      BOTTLES DRAWN AEROBIC AND ANAEROBIC Blood Culture adequate volume   Culture      NO GROWTH < 24 HOURS Performed at [facility], [place] [address], [place], [place] [address]    Report Status PENDING   Culture, blood (Routine X 2) w Reflex to ID Panel     Status: None (Preliminary result)   Collection Time: 03/26/22 10:04 AM   Specimen: BLOOD RIGHT HAND  Result Value Ref Range   Specimen Description BLOOD RIGHT HAND    Special Requests      BOTTLES DRAWN AEROBIC AND ANAEROBIC Blood Culture results may not be optimal due to an excessive volume of blood received in culture bottles   Culture      NO GROWTH < 24 HOURS Performed at [facility] [address] [place],  [address]    Report Status PENDING   Glucose, capillary     Status: Abnormal   Collection Time: 03/26/22 11:22 AM  Result Value Ref Range   Glucose-Capillary 192 (H) 70 - 99 mg/dL    Comment: Glucose reference range applies only to samples taken after fasting for at least 8 hours.   Comment 1 Notify RN    Comment 2 Document in Chart   Glucose, capillary     Status: Abnormal   Collection Time: 03/26/22  4:07 PM  Result Value Ref Range   Glucose-Capillary 118 (H) 70 - 99 mg/dL    Comment: Glucose reference range applies only to samples taken after fasting for at least 8 hours.   Comment 1 Notify RN    Comment 2 Document in Chart   Glucose, capillary     Status: Abnormal   Collection Time: 03/26/22  8:48 PM  Result Value Ref Range   Glucose-Capillary 115 (H) 70 - 99 mg/dL  Comment: Glucose reference range applies only to samples taken after fasting for at least 8 hours.   Comment 1 Notify RN    Comment 2 Document in Chart   CBC     Status: Abnormal    Collection Time: 03/27/22 12:38 AM  Result Value Ref Range   WBC 4.7 4.0 - 10.5 K/uL   RBC 3.60 (L) 4.22 - 5.81 MIL/uL   Hemoglobin 9.9 (L) 13.0 - 17.0 g/dL   HCT 30.7 (L) 39.0 - 52.0 %   MCV 85.3 80.0 - 100.0 fL   MCH 27.5 26.0 - 34.0 pg   MCHC 32.2 30.0 - 36.0 g/dL   RDW 16.0 (H) 11.5 - 15.5 %   Platelets 104 (L) 150 - 400 K/uL   nRBC 0.0 0.0 - 0.2 %    Comment: Performed at [facility], [place] [address], [place], [place] [address]  Basic metabolic panel     Status: Abnormal   Collection Time: 03/27/22 12:38 AM  Result Value Ref Range   Sodium 139 135 - 145 mmol/L   Potassium 3.8 3.5 - 5.1 mmol/L   Chloride 104 98 - 111 mmol/L   CO2 23 22 - 32 mmol/L   Glucose, Bld 102 (H) 70 - 99 mg/dL    Comment: Glucose reference range applies only to samples taken after fasting for at least 8 hours.   BUN 85 (H) 8 - 23 mg/dL   Creatinine, Ser 5.34 (H) 0.61 - 1.24 mg/dL   Calcium 9.2 8.9 - 10.3 mg/dL   GFR, Estimated 11 (L) >60 mL/min    Comment: (NOTE) Calculated using the CKD-EPI Creatinine Equation (2021)    Anion gap 12 5 - 15    Comment: Performed at [facility] [address], [place], [place] [address]  Procalcitonin - Baseline     Status: None   Collection Time: 03/27/22 12:38 AM  Result Value Ref Range   Procalcitonin 2.21 ng/mL    Comment:        Interpretation: PCT > 2 ng/mL: Systemic infection (sepsis) is likely, unless other causes are known. (NOTE)       Sepsis PCT Algorithm           Lower Respiratory Tract                                      Infection PCT Algorithm    ----------------------------     ----------------------------         PCT < 0.25 ng/mL                PCT < 0.10 ng/mL          Strongly encourage             Strongly discourage   discontinuation of antibiotics    initiation of antibiotics    ----------------------------     -----------------------------       PCT 0.25 - 0.50 ng/mL            PCT 0.10 - 0.25 ng/mL                OR       >80% decrease in PCT            Discourage initiation of  antibiotics      Encourage discontinuation           of antibiotics    ----------------------------     -----------------------------         PCT >= 0.50 ng/mL              PCT 0.26 - 0.50 ng/mL               AND       <80% decrease in PCT              Encourage initiation of                                             antibiotics       Encourage continuation           of antibiotics    ----------------------------     -----------------------------        PCT >= 0.50 ng/mL                  PCT > 0.50 ng/mL               AND         increase in PCT                  Strongly encourage                                      initiation of antibiotics    Strongly encourage escalation           of antibiotics                                     -----------------------------                                           PCT <= 0.25 ng/mL                                                 OR                                        > 80% decrease in PCT                                      Discontinue / Do not initiate                                             antibiotics  Performed at [facility], [place] [address], [place], [place] [address]   Glucose, capillary     Status: Abnormal   Collection Time:  03/27/22  6:03 AM  Result Value Ref Range   Glucose-Capillary 104 (H) 70 - 99 mg/dL    Comment: Glucose reference range applies only to samples taken after fasting for at least 8 hours.   Comment 1 Notify RN    Comment 2 Document in Chart     No results found.  Review of Systems  HENT:  Negative for ear discharge, ear pain, hearing loss and tinnitus.   Eyes:  Negative for photophobia and pain.  Respiratory:  Negative for cough and shortness of breath.   Cardiovascular:  Negative for chest pain.  Gastrointestinal:  Negative for abdominal pain, nausea and vomiting.   Genitourinary:  Negative for dysuria, flank pain, frequency and urgency.  Musculoskeletal:  Positive for arthralgias (Right knee). Negative for back pain, myalgias and neck pain.  Neurological:  Negative for dizziness and headaches.  Hematological:  Does not bruise/bleed easily.  Psychiatric/Behavioral:  The patient is not nervous/anxious.    Blood pressure 126/75, pulse (!) 55, temperature 99.4 F (37.4 C), temperature source Oral, resp. rate 20, height '5\' 6"'$  (1.676 m), weight 71 kg, SpO2 98 %. Physical Exam Constitutional:      General: He is not in acute distress.    Appearance: He is well-developed. He is not diaphoretic.  HENT:     Head: Normocephalic and atraumatic.  Eyes:     General: No scleral icterus.       Right eye: No discharge.        Left eye: No discharge.     Conjunctiva/sclera: Conjunctivae normal.  Cardiovascular:     Rate and Rhythm: Normal rate and regular rhythm.  Pulmonary:     Effort: Pulmonary effort is normal. No respiratory distress.  Musculoskeletal:     Cervical back: Normal range of motion.     Comments: RLE No traumatic wounds, ecchymosis, or rash  Nontender, pain with AROM knee but able to range >90  Mild knee effusion  Knee stable to varus/ valgus and anterior/posterior stress  Sens DPN, SPN, TN intact  Motor EHL, ext, flex, evers 5/5  DP 1+, PT 0, No significant edema  Skin:    General: Skin is warm and dry.  Neurological:     Mental Status: He is alert.  Psychiatric:        Mood and Affect: Mood normal.        Behavior: Behavior normal.     Assessment/Plan: Right knee pain -- Suspect gout but will tap and send for analysis to be sure. X-ray as well.    Lisette Abu, PA-C Orthopedic Surgery [phone] 03/27/2022, 10:40 AM

## 2022-03-27 NOTE — Progress Notes (Signed)
Orthopedic Tech Progress Note Patient Details:  Corey [name] [facility] Oct 05, 1945 [id number]  Ortho Devices Type of Ortho Device: Ace wrap, Unna boot Ortho Device/Splint Location: RLE Ortho Device/Splint Interventions: Ordered, Application, Adjustment   Post Interventions Patient Tolerated: Well Instructions Provided: Care of device  [name] 03/27/2022, 4:31 PM

## 2022-03-27 NOTE — Progress Notes (Signed)
[facility] NEPHROLOGY PROGRESS NOTE  Assessment/ Plan: Pt is a 76 y.o. yo male  with HTN, HLD, DM, CVA, A-fib, prostate CA, amyloidosis, combined systolic and diastolic CHF (EF 20-25% w/ gr 2 diastolic dysfn), PEA arrest, chronic pericardial effusion status post pericardial window, CKD stage IV followed by Dr. Royce Macadamia at [facility] p/w worsening shortness of breath and DOE. Recent baseline serum creatinine level seems to be around 3.3-3.9 on torsemide 20 mg 3 times a week at home.  He underwent left brachial artery to cephalic vein AV fistula creation by Dr. Donzetta Matters on 01/31/2022. In the ED, Cr 4.58, CO2 19, BNP 1571, troponin 856, hemoglobin 11.6.  The chest x-ray consistent with diffuse bilateral pulmonary edema.  # Acute on chronic CKD stage IV with fluid overload w/ CKD thought to be due to diabetes and hypertension with recent baseline serum creatinine level around 3.3-3.9 followed by Dr. Harrie Jeans.   Would like to try and avoid starting dialysis in the hospital unless absolutely necessary and the pt agrees. But if necessary he's willing to initiate hemodialysis during this admission.  No absolute indications and he is not uremic and nonoliguric.  [place] 11/10 showed low filling pressures. Would continue holding diuresis.  # Vascular dialysis access: Left upper extremity AV fistula does not look matured therefore he may need HD catheter if dialysis needed.    # Acute on chronic combined CHF: Echo with EF of 30 to 35%, severe LVH. Initially diuresing with IV Lasix and metolazone but onhold with worsening renal function and also low filling pressures on RHC. Cardiology is following.   # Metabolic acidosis: Continue oral sodium bicarbonate.   # Anemia of CKD: Hemoglobin at goal.  Continue to monitor.   # CKD-MBD: Calcium acceptable, phosphorus 6.7 -> Renvela 1 TIDM.  Monitor lab.  #Right knee pain: Orthopedic was consulted suspect gout, plan to tap and sent for analysis and x-ray.  Started  steroids.  Subjective: Seen and examined at bedside.  Patient had urine output of around 1.4 L in 24 hours.  He reports right knee pain.  Denies nausea, vomiting, chest pain, shortness of breath.  No dysgeusia. Objective Vital signs in last 24 hours: Vitals:   03/27/22 0614 03/27/22 0945 03/27/22 1130 03/27/22 1205  BP:  126/75 105/66 120/65  Pulse:  (!) 55 (!) 50 61  Resp:  '20 17 17  '$ Temp:  99.4 F (37.4 C) 98.9 F (37.2 C) 99.4 F (37.4 C)  TempSrc:  Oral Oral Oral  SpO2:  98% 96% 100%  Weight: 71 kg     Height:       Weight change: -8.5 kg  Intake/Output Summary (Last 24 hours) at 03/27/2022 1258 Last data filed at 03/27/2022 1131 Gross per 24 hour  Intake 1020 ml  Output 1475 ml  Net -455 ml       Labs: RENAL PANEL Recent Labs    06/10/21 1134 08/09/21 1000 09/29/21 1601 10/11/21 1031 01/05/22 1125 01/31/22 0750 03/22/22 1659 03/24/22 0032 03/24/22 1428 03/25/22 0035 03/26/22 0024 03/27/22 0038  NA 142 143 143 142 140 141 143 140 141  142 140 139 139  K 4.2 3.5 4.0 4.3 4.3 3.8 3.8 3.7 3.6  3.6 3.9 3.4* 3.8  CL 108 112 108* 106 107 112* 113* 106  --  108 105 104  CO2 27 21 19* 22 23  --  19* 21*  --  21* 23 23  GLUCOSE 108* 66* 89 103* 95 89 78 113*  --  133* 96 102*  BUN 42* 60* 66* 68* 56* 52* 70* 76*  --  78* 80* 85*  CREATININE 2.19* 3.26* 3.41* 3.35* 3.75* 3.10* 4.58* 4.93*  --  5.22* 5.26* 5.34*  CALCIUM 9.8 9.3 9.8 9.8 9.8  --  9.6 9.3  9.4  --  9.5 9.3 9.2  PHOS  --   --   --   --   --   --   --  6.7*  --   --   --   --   ALBUMIN  --   --   --   --  3.8  --   --   --   --   --   --   --      Liver Function Tests: No results for input(s): "AST", "ALT", "ALKPHOS", "BILITOT", "PROT", "ALBUMIN" in the last 168 hours. No results for input(s): "LIPASE", "AMYLASE" in the last 168 hours. No results for input(s): "AMMONIA" in the last 168 hours. CBC: Recent Labs    03/22/22 1659 03/23/22 1615 03/24/22 0032 03/24/22 1428 03/25/22 0035  03/27/22 0038  HGB 11.6*  --  11.2* 11.6*  11.9* 11.5* 9.9*  MCV 87.7  --  87.7  --  85.8 85.3  VITAMINB12  --  848  --   --   --   --   FOLATE  --  14.1  --   --   --   --   FERRITIN  --  95  --   --   --   --   TIBC  --  272  --   --   --   --   IRON  --  52  --   --   --   --   RETICCTPCT  --  0.7  --   --   --   --     Cardiac Enzymes: No results for input(s): "CKTOTAL", "CKMB", "CKMBINDEX", "TROPONINI" in the last 168 hours. CBG: Recent Labs  Lab 03/26/22 1122 03/26/22 1607 03/26/22 2048 03/27/22 0603 03/27/22 1127  GLUCAP 192* 118* 115* 104* 141*    Iron Studies: No results for input(s): "IRON", "TIBC", "TRANSFERRIN", "FERRITIN" in the last 72 hours. Studies/Results: DG Knee Complete 4 Views Right  Result Date: 03/27/2022 CLINICAL DATA:  Pain and swelling without trauma EXAM: RIGHT KNEE - COMPLETE 4+ VIEW COMPARISON:  None Available. FINDINGS: Vascular calcifications. No acute fracture or dislocation. 3 compartment mild osteoarthritis. Small suprapatellar joint effusion. Enthesophyte at the quadriceps insertion. IMPRESSION: Mild osteoarthritis with small suprapatellar joint effusion. Electronically Signed   By: Abigail Miyamoto M.D.   On: 03/27/2022 12:27    Medications: Infusions:  sodium chloride      Scheduled Medications:  allopurinol  50 mg Oral Daily   apixaban  5 mg Oral BID   aspirin  81 mg Oral Daily   bupivacaine(PF)  10 mL Infiltration Once   hydrALAZINE  25 mg Oral TID   insulin aspart  0-9 Units Subcutaneous TID WC   methylPREDNISolone acetate  40 mg Intra-articular Once   polyethylene glycol  17 g Oral Daily   rosuvastatin  40 mg Oral Daily   senna-docusate  1 tablet Oral BID   sevelamer carbonate  800 mg Oral TID WC   sodium bicarbonate  650 mg Oral BID   sodium chloride flush  3 mL Intravenous Q12H   Tafamidis  1 capsule Oral Daily    have reviewed scheduled and prn medications.  Physical Exam: General:NAD,  comfortable Heart:RRR, s1s2  nl Lungs:clear b/l, no crackle Abdomen:soft, Non-tender, non-distended Extremities: Trace edema, right knee is warm Dialysis Access: Left upper extremity AV fistula has good thrill and bruit.  Haddie Bruhl Prasad Jairen Goldfarb 03/27/2022,12:58 PM  LOS: 4 days

## 2022-03-27 NOTE — Progress Notes (Signed)
PROGRESS NOTE    Corey [name]  QJJ:[id number] DOB: Feb 29, 1946 DOA: 03/22/2022 PCP: [name], MD  75/M with history of atrial fibrillation on Eliquis, history of CVA, chronic systolic and diastolic CHF, EF 48-18%, cardiac amyloidosis, CKD 4-5, status post creation of left arm AV fistula on 9/19, chronic right leg wound followed at the wound center presented to the ED from cardiology clinic with worsening dyspnea on exertion, orthopnea, edema. -In the ED BNP was 1571, chest x-ray noted bilateral interstitial edema, BUN was 70, creatinine 4.5, EKG noted atrial flutter -Diuresed with IV Lasix with brisk response -Right heart cath 11/10: Low filling pressures, diuretics held -11/12 low-grade fevers overnight  Subjective: -Low-grade fevers again last night, now complains of right knee pain  Assessment and Plan:  Acute on chronic combined systolic and diastolic heart failure  Known cardiac amyloidosis, positive genetic testing for ATTR amyloid -diuresed well w/ IV lasix, 7.5 L negative -repea ECHO w/ EF 30-35%, severe LVH -appreciate Renal input, has a left arm AV fistula placed 9/19 -RHC with low filling pressures, diuretics held -Nephrology following, hope to avoid dialysis this admission if possible, aVF not mature yet -Creatinine stable, continue hydralazine and Imdur   Elevated troponin -Due to demand ischemia, do not suspect ACS   AKI on chronic kidney disease (CKD), stage IV (severe) (HCC) Pt has left UE AVF that was placed in 01/31/2022. -Creatinine worse than baseline,  baseline around 3.5, possibly cardiorenal, holding diuretics -RHC noted low filling pressures -nephrology following, no plans for HD yet -Diuretics held as noted above -Creatinine plateauing  Fever, 11/11-12 Right knee pain -Clinically suspect gout flare, however given low-grade fevers will request Ortho eval prior to giving steroids -Blood cultures negative, right lower leg small wound with clean base not  infected   Paroxysmal atrial fibrillation (HCC) Currently in atrial flutter.  -Rate controlled. Continue with Eliquis.   Right lower leg chronic wound -Managed at the wound center and with home health RN, changed 11/9 by [place] team   Hereditary cardiac amyloidosis ([address]) On tafamidis.    S/P pericardial window creation -no effusion noted now   Diabetes (HCC) Stable.,  A1c is 5.7, continue sliding scale insulin   Essential hypertension Stable.    DVT prophylaxis: Eliquis Code Status: Full Code Family Communication: None present at bedside today  disposition Plan: Home likely 48 hours if stable  Consultants: Cards, renal   Procedures:   Antimicrobials:    Objective: Vitals:   03/27/22 0431 03/27/22 0438 03/27/22 0614 03/27/22 0945  BP: 118/72 118/72  126/75  Pulse:    (!) 55  Resp: '17 17  20  '$ Temp:  98.4 F (36.9 C)  99.4 F (37.4 C)  TempSrc:  Oral  Oral  SpO2:  100%  98%  Weight:   71 kg   Height:        Intake/Output Summary (Last 24 hours) at 03/27/2022 1128 Last data filed at 03/27/2022 5631 Gross per 24 hour  Intake 1140 ml  Output 1400 ml  Net -260 ml   Filed Weights   03/25/22 0603 03/26/22 0359 03/27/22 0614  Weight: 73.3 kg 79.5 kg 71 kg    Examination:  Gen: Pleasant elderly male sitting up in bed, AAOx3, no distress HEENT: No JVD CVS: S1-S2, regular rhythm Lungs: Clear bilaterally Abdomen: Soft, nontender, bowel sounds present Extremities: Left arm AV fistula, right knee with mild warmth and swelling Skin: Right lower leg with dime sized wound, pinkish-red granulation tissue, no purulence or  drainage  Psychiatry:  Mood & affect appropriate.     Data Reviewed:   CBC: Recent Labs  Lab 03/22/22 1659 03/24/22 0032 03/24/22 1428 03/25/22 0035 03/27/22 0038  WBC 2.7* 2.7*  --  4.8 4.7  NEUTROABS 1.6*  --   --   --   --   HGB 11.6* 11.2* 11.6*  11.9* 11.5* 9.9*  HCT 36.4* 35.5* 34.0*  35.0* 34.5* 30.7*  MCV 87.7 87.7  --  85.8  85.3  PLT 103* 117*  --  120* 283*   Basic Metabolic Panel: Recent Labs  Lab 03/22/22 1659 03/24/22 0032 03/24/22 1428 03/25/22 0035 03/26/22 0024 03/27/22 0038  NA 143 140 141  142 140 139 139  K 3.8 3.7 3.6  3.6 3.9 3.4* 3.8  CL 113* 106  --  108 105 104  CO2 19* 21*  --  21* 23 23  GLUCOSE 78 113*  --  133* 96 102*  BUN 70* 76*  --  78* 80* 85*  CREATININE 4.58* 4.93*  --  5.22* 5.26* 5.34*  CALCIUM 9.6 9.3  9.4  --  9.5 9.3 9.2  PHOS  --  6.7*  --   --   --   --    GFR: Estimated Creatinine Clearance: 11.1 mL/min (A) (by C-G formula based on SCr of 5.34 mg/dL (H)). Liver Function Tests: No results for input(s): "AST", "ALT", "ALKPHOS", "BILITOT", "PROT", "ALBUMIN" in the last 168 hours. No results for input(s): "LIPASE", "AMYLASE" in the last 168 hours. No results for input(s): "AMMONIA" in the last 168 hours. Coagulation Profile: No results for input(s): "INR", "PROTIME" in the last 168 hours. Cardiac Enzymes: No results for input(s): "CKTOTAL", "CKMB", "CKMBINDEX", "TROPONINI" in the last 168 hours. BNP (last 3 results) Recent Labs    01/05/22 1125  PROBNP 1,740.0*   HbA1C: No results for input(s): "HGBA1C" in the last 72 hours.  CBG: Recent Labs  Lab 03/26/22 0611 03/26/22 1122 03/26/22 1607 03/26/22 2048 03/27/22 0603  GLUCAP 85 192* 118* 115* 104*   Lipid Profile: No results for input(s): "CHOL", "HDL", "LDLCALC", "TRIG", "CHOLHDL", "LDLDIRECT" in the last 72 hours.  Thyroid Function Tests: No results for input(s): "TSH", "T4TOTAL", "FREET4", "T3FREE", "THYROIDAB" in the last 72 hours. Anemia Panel: No results for input(s): "VITAMINB12", "FOLATE", "FERRITIN", "TIBC", "IRON", "RETICCTPCT" in the last 72 hours.  Urine analysis:    Component Value Date/Time   COLORURINE YELLOW 09/25/2020 2131   APPEARANCEUR CLEAR 09/25/2020 2131   LABSPEC 1.022 09/25/2020 2131   PHURINE 5.0 09/25/2020 2131   GLUCOSEU >=500 (A) 09/25/2020 2131   GLUCOSEU  NEGATIVE 07/20/2014 1055   HGBUR NEGATIVE 09/25/2020 2131   HGBUR negative 08/28/2008 1446   BILIRUBINUR NEGATIVE 09/25/2020 2131   BILIRUBINUR Neg 10/28/2014 0916   KETONESUR NEGATIVE 09/25/2020 2131   PROTEINUR NEGATIVE 09/25/2020 2131   UROBILINOGEN 0.2 10/28/2014 0916   UROBILINOGEN 0.2 07/20/2014 1055   NITRITE NEGATIVE 09/25/2020 2131   LEUKOCYTESUR SMALL (A) 09/25/2020 2131   Sepsis Labs: '@LABRCNTIP'$ (procalcitonin:4,lacticidven:4)  ) Recent Results (from the past 240 hour(s))  SARS Coronavirus 2 by RT PCR (hospital order, performed in [place] hospital lab) *cepheid single result test* Anterior Nasal Swab     Status: None   Collection Time: 03/26/22  9:18 AM   Specimen: Anterior Nasal Swab  Result Value Ref Range Status   SARS Coronavirus 2 by RT PCR NEGATIVE NEGATIVE Final    Comment: (NOTE) SARS-CoV-2 target nucleic acids are NOT DETECTED.  The SARS-CoV-2 RNA  is generally detectable in upper and lower respiratory specimens during the acute phase of infection. The lowest concentration of SARS-CoV-2 viral copies this assay can detect is 250 copies / mL. A negative result does not preclude SARS-CoV-2 infection and should not be used as the sole basis for treatment or other patient management decisions.  A negative result may occur with improper specimen collection / handling, submission of specimen other than nasopharyngeal swab, presence of viral mutation(s) within the areas targeted by this assay, and inadequate number of viral copies (<250 copies / mL). A negative result must be combined with clinical observations, patient history, and epidemiological information.  Fact Sheet for Patients:   https://www.patel.info/  Fact Sheet for Healthcare Providers: https://hall.com/  This test is not yet approved or  cleared by the [place] FDA and has been authorized for detection and/or diagnosis of SARS-CoV-2 by FDA under an  Emergency Use Authorization (EUA).  This EUA will remain in effect (meaning this test can be used) for the duration of the COVID-19 declaration under Section 564(b)(1) of the Act, 21 U.S.C. section 360bbb-3(b)(1), unless the authorization is terminated or revoked sooner.  Performed at [facility], [place] [address] [place], [place] [address]   Culture, blood (Routine X 2) w Reflex to ID Panel     Status: None (Preliminary result)   Collection Time: 03/26/22  9:50 AM   Specimen: BLOOD  Result Value Ref Range Status   Specimen Description BLOOD BLOOD RIGHT ARM  Final   Special Requests   Final    BOTTLES DRAWN AEROBIC AND ANAEROBIC Blood Culture adequate volume   Culture   Final    NO GROWTH < 24 HOURS Performed at [facility], [place] [address] [place], [place] [address]    Report Status PENDING  Incomplete  Culture, blood (Routine X 2) w Reflex to ID Panel     Status: None (Preliminary result)   Collection Time: 03/26/22 10:04 AM   Specimen: BLOOD RIGHT HAND  Result Value Ref Range Status   Specimen Description BLOOD RIGHT HAND  Final   Special Requests   Final    BOTTLES DRAWN AEROBIC AND ANAEROBIC Blood Culture results may not be optimal due to an excessive volume of blood received in culture bottles   Culture   Final    NO GROWTH < 24 HOURS Performed at [facility], [place] [address], [place], [place] [address]    Report Status PENDING  Incomplete     Radiology Studies: No results found.   Scheduled Meds:  allopurinol  50 mg Oral Daily   apixaban  5 mg Oral BID   aspirin  81 mg Oral Daily   bupivacaine(PF)  10 mL Infiltration Once   hydrALAZINE  25 mg Oral TID   insulin aspart  0-9 Units Subcutaneous TID WC   methylPREDNISolone acetate  40 mg Intra-articular Once   polyethylene glycol  17 g Oral Daily   rosuvastatin  40 mg Oral Daily   senna-docusate  1 tablet Oral BID   sevelamer carbonate  800 mg Oral TID WC   sodium bicarbonate  650 mg Oral BID    sodium chloride flush  3 mL Intravenous Q12H   Tafamidis  1 capsule Oral Daily   Continuous Infusions:  sodium chloride       LOS: 4 days    Time spent: 28mn  PDomenic Polite MD [facility]   03/27/2022, 11:28 AM

## 2022-03-28 DIAGNOSIS — I5043 Acute on chronic combined systolic (congestive) and diastolic (congestive) heart failure: Secondary | ICD-10-CM | POA: Diagnosis not present

## 2022-03-28 LAB — RENAL FUNCTION PANEL
Albumin: 2.6 g/dL — ABNORMAL LOW (ref 3.5–5.0)
Anion gap: 13 (ref 5–15)
BUN: 91 mg/dL — ABNORMAL HIGH (ref 8–23)
CO2: 23 mmol/L (ref 22–32)
Calcium: 9.3 mg/dL (ref 8.9–10.3)
Chloride: 100 mmol/L (ref 98–111)
Creatinine, Ser: 5.46 mg/dL — ABNORMAL HIGH (ref 0.61–1.24)
GFR, Estimated: 10 mL/min — ABNORMAL LOW (ref >60)
Glucose, Bld: 260 mg/dL — ABNORMAL HIGH (ref 70–99)
Phosphorus: 5.5 mg/dL — ABNORMAL HIGH (ref 2.5–4.6)
Potassium: 4 mmol/L (ref 3.5–5.1)
Sodium: 136 mmol/L (ref 135–145)

## 2022-03-28 LAB — GLUCOSE, CAPILLARY
Glucose-Capillary: 218 mg/dL — ABNORMAL HIGH (ref 70–99)
Glucose-Capillary: 153 mg/dL — ABNORMAL HIGH (ref 70–99)
Glucose-Capillary: 128 mg/dL — ABNORMAL HIGH (ref 70–99)
Glucose-Capillary: 136 mg/dL — ABNORMAL HIGH (ref 70–99)

## 2022-03-28 LAB — CBC
HCT: 30.9 % — ABNORMAL LOW (ref 39.0–52.0)
Hemoglobin: 9.9 g/dL — ABNORMAL LOW (ref 13.0–17.0)
MCH: 27.6 pg (ref 26.0–34.0)
MCHC: 32 g/dL (ref 30.0–36.0)
MCV: 86.1 fL (ref 80.0–100.0)
Platelets: 112 10*3/uL — ABNORMAL LOW (ref 150–400)
RBC: 3.59 MIL/uL — ABNORMAL LOW (ref 4.22–5.81)
RDW: 15.9 % — ABNORMAL HIGH (ref 11.5–15.5)
WBC: 4.1 10*3/uL (ref 4.0–10.5)
nRBC: 0 % (ref 0.0–0.2)

## 2022-03-28 LAB — LIPOPROTEIN A (LPA): Lipoprotein (a): 167.4 nmol/L — ABNORMAL HIGH (ref <75.0)

## 2022-03-28 MED ORDER — SODIUM CHLORIDE 0.9 % IV SOLN: INTRAVENOUS | Status: AC

## 2022-03-28 NOTE — Care Management Important Message (Signed)
Important Message  Patient Details  Name: Corey [name] MRN: [id number] Date of Birth: [birth year]   Medicare Important Message Given:  Yes     [name] 03/28/2022, 10:50 AM

## 2022-03-28 NOTE — Progress Notes (Signed)
   03/28/22 1000  Mobility  Activity Ambulated with assistance in room  Level of Assistance Contact guard assist, steadying assist  Assistive Device Front wheel walker  Distance Ambulated (ft) 5 ft  Activity Response Tolerated well  Mobility Referral Yes  $Mobility charge 1 Mobility   Mobility Specialist Progress Note  Pt was in bed and agreeable. X5 STS, along with side shuffles. Had c/o of knee pain. Returned to chair w/ all needs met and call bell in reach.   Lucious Groves Mobility Specialist

## 2022-03-28 NOTE — Progress Notes (Addendum)
PROGRESS NOTE    Corey [name]  QPY:[id number] DOB: [birth year] DOA: 03/22/2022 PCP: [name], MD  75/M with history of atrial fibrillation on Eliquis, history of CVA, chronic systolic and diastolic CHF, EF 12-45%, cardiac amyloidosis, CKD 4-5, status post creation of left arm AV fistula on 9/19, chronic right leg wound followed at the wound center presented to the ED from cardiology clinic with worsening dyspnea on exertion, orthopnea, edema. -In the ED BNP was 1571, chest x-ray noted bilateral interstitial edema, BUN was 70, creatinine 4.5, EKG noted atrial flutter -Diuresed with IV Lasix with brisk response -Right heart cath 11/10: Low filling pressures, diuretics held -11/12 low-grade fevers overnight,  -11/13 with right knee pain,-gout flare, status post arthrocentesis and intra-articular steroid injection  Subjective: -Right knee much improved, denies any pain or discomfort there  Assessment and Plan:  Acute on chronic combined systolic and diastolic heart failure  Known cardiac amyloidosis, positive genetic testing for ATTR amyloid -diuresed well w/ IV lasix, 7.5 L negative -repea ECHO w/ EF 30-35%, severe LVH -appreciate Renal input, has a left arm AV fistula placed 9/19 -RHC with low filling pressures, diuretics held -Nephrology following, hope to avoid dialysis this admission if possible, aVF not mature yet -Creatinine stable, continue hydralazine, avoid hypotension -BMP in a.m. -Per renal plan for gentle IVF today   Elevated troponin -Due to demand ischemia, do not suspect ACS   AKI on chronic kidney disease (CKD), stage IV (severe) (HCC) Pt has left UE AVF that was placed in 01/31/2022. -Creatinine worse than baseline,  baseline around 3.5, possibly cardiorenal, holding diuretics -RHC noted low filling pressures -nephrology following, no plans for HD yet -Diuretics held as noted above, getting fluids today -Creatinine plateauing around 5.4 today  Fever,  11/11-12 Right knee pain, gout flare -Appreciate Ortho eval, underwent arthrocentesis yesterday, synovial fluid not suggestive of septic arthritis, status post intra-articular steroid injection, Gram stain negative, cultures negative X 1 day -Blood cultures negative, right lower leg small wound with clean base not infected   Paroxysmal atrial fibrillation (HCC) Currently in atrial flutter.  -Rate controlled. Continue with Eliquis.   Right lower leg chronic wound -Managed at the wound center and with home health RN, changed 11/9 by [place] team   Hereditary cardiac amyloidosis ([place]) On tafamidis.    S/P pericardial window creation -no effusion noted now   Diabetes (HCC) Stable.,  A1c is 5.7, continue sliding scale insulin   Essential hypertension Stable.    DVT prophylaxis: Eliquis Code Status: Full Code Family Communication: None present at bedside today  disposition Plan: Home likely 48 hours if stable  Consultants: Cards, renal, orthopedics   Procedures: Arthrocentesis and intra-articular steroid injection 11/13  Antimicrobials:    Objective: Vitals:   03/27/22 1818 03/27/22 2100 03/28/22 0356 03/28/22 0727  BP: 111/73 133/77  127/73  Pulse: (!) 57 (!) 48 (!) 48 (!) 48  Resp: '20 18 20 20  '$ Temp: 98.7 F (37.1 C) 98.8 F (37.1 C) 98.2 F (36.8 C) 98.5 F (36.9 C)  TempSrc: Oral Oral Oral Oral  SpO2: 98% 99% 97% 98%  Weight:   73.2 kg   Height:        Intake/Output Summary (Last 24 hours) at 03/28/2022 1102 Last data filed at 03/28/2022 0940 Gross per 24 hour  Intake 480 ml  Output 1925 ml  Net -1445 ml   Filed Weights   03/26/22 0359 03/27/22 0614 03/28/22 0356  Weight: 79.5 kg 71 kg 73.2 kg  Examination:  Gen: Pleasant male sitting up in bed, AAOx3, no distress HEENT: No JVD CVS: S1-S2, regular rhythm Lungs: Clear bilaterally Abdomen: Soft, nontender, bowel sounds present  Extremities: Left arm AV fistula, right knee warmth and swelling improved  skin: Right lower leg with dime sized wound, pinkish-red granulation tissue, no purulence or drainage  Psychiatry:  Mood & affect appropriate.     Data Reviewed:   CBC: Recent Labs  Lab 03/22/22 1659 03/24/22 0032 03/24/22 1428 03/25/22 0035 03/27/22 0038 03/28/22 0035  WBC 2.7* 2.7*  --  4.8 4.7 4.1  NEUTROABS 1.6*  --   --   --   --   --   HGB 11.6* 11.2* 11.6*  11.9* 11.5* 9.9* 9.9*  HCT 36.4* 35.5* 34.0*  35.0* 34.5* 30.7* 30.9*  MCV 87.7 87.7  --  85.8 85.3 86.1  PLT 103* 117*  --  120* 104* 938*   Basic Metabolic Panel: Recent Labs  Lab 03/24/22 0032 03/24/22 1428 03/25/22 0035 03/26/22 0024 03/27/22 0038 03/28/22 0035  NA 140 141  142 140 139 139 136  K 3.7 3.6  3.6 3.9 3.4* 3.8 4.0  CL 106  --  108 105 104 100  CO2 21*  --  21* '23 23 23  '$ GLUCOSE 113*  --  133* 96 102* 260*  BUN 76*  --  78* 80* 85* 91*  CREATININE 4.93*  --  5.22* 5.26* 5.34* 5.46*  CALCIUM 9.3  9.4  --  9.5 9.3 9.2 9.3  PHOS 6.7*  --   --   --   --  5.5*   GFR: Estimated Creatinine Clearance: 10.9 mL/min (A) (by C-G formula based on SCr of 5.46 mg/dL (H)). Liver Function Tests: Recent Labs  Lab 03/28/22 0035  ALBUMIN 2.6*   No results for input(s): "LIPASE", "AMYLASE" in the last 168 hours. No results for input(s): "AMMONIA" in the last 168 hours. Coagulation Profile: No results for input(s): "INR", "PROTIME" in the last 168 hours. Cardiac Enzymes: No results for input(s): "CKTOTAL", "CKMB", "CKMBINDEX", "TROPONINI" in the last 168 hours. BNP (last 3 results) Recent Labs    01/05/22 1125  PROBNP 1,740.0*   HbA1C: No results for input(s): "HGBA1C" in the last 72 hours.  CBG: Recent Labs  Lab 03/27/22 0603 03/27/22 1127 03/27/22 1541 03/27/22 2108 03/28/22 0616  GLUCAP 104* 141* 169* 249* 218*   Lipid Profile: No results for input(s): "CHOL", "HDL", "LDLCALC", "TRIG", "CHOLHDL", "LDLDIRECT" in the last 72 hours.  Thyroid Function Tests: No results for  input(s): "TSH", "T4TOTAL", "FREET4", "T3FREE", "THYROIDAB" in the last 72 hours. Anemia Panel: No results for input(s): "VITAMINB12", "FOLATE", "FERRITIN", "TIBC", "IRON", "RETICCTPCT" in the last 72 hours.  Urine analysis:    Component Value Date/Time   COLORURINE YELLOW 09/25/2020 2131   APPEARANCEUR CLEAR 09/25/2020 2131   LABSPEC 1.022 09/25/2020 2131   PHURINE 5.0 09/25/2020 2131   GLUCOSEU >=500 (A) 09/25/2020 2131   GLUCOSEU NEGATIVE 07/20/2014 1055   HGBUR NEGATIVE 09/25/2020 2131   HGBUR negative 08/28/2008 1446   BILIRUBINUR NEGATIVE 09/25/2020 2131   BILIRUBINUR Neg 10/28/2014 0916   KETONESUR NEGATIVE 09/25/2020 2131   PROTEINUR NEGATIVE 09/25/2020 2131   UROBILINOGEN 0.2 10/28/2014 0916   UROBILINOGEN 0.2 07/20/2014 1055   NITRITE NEGATIVE 09/25/2020 2131   LEUKOCYTESUR SMALL (A) 09/25/2020 2131   Sepsis Labs: '@LABRCNTIP'$ (procalcitonin:4,lacticidven:4)  ) Recent Results (from the past 240 hour(s))  SARS Coronavirus 2 by RT PCR (hospital order, performed in [facility] hospital lab) *cepheid single  result test* Anterior Nasal Swab     Status: None   Collection Time: 03/26/22  9:18 AM   Specimen: Anterior Nasal Swab  Result Value Ref Range Status   SARS Coronavirus 2 by RT PCR NEGATIVE NEGATIVE Final    Comment: (NOTE) SARS-CoV-2 target nucleic acids are NOT DETECTED.  The SARS-CoV-2 RNA is generally detectable in upper and lower respiratory specimens during the acute phase of infection. The lowest concentration of SARS-CoV-2 viral copies this assay can detect is 250 copies / mL. A negative result does not preclude SARS-CoV-2 infection and should not be used as the sole basis for treatment or other patient management decisions.  A negative result may occur with improper specimen collection / handling, submission of specimen other than nasopharyngeal swab, presence of viral mutation(s) within the areas targeted by this assay, and inadequate number of viral  copies (<250 copies / mL). A negative result must be combined with clinical observations, patient history, and epidemiological information.  Fact Sheet for Patients:   https://www.patel.info/  Fact Sheet for Healthcare Providers: https://hall.com/  This test is not yet approved or  cleared by the [place] FDA and has been authorized for detection and/or diagnosis of SARS-CoV-2 by FDA under an Emergency Use Authorization (EUA).  This EUA will remain in effect (meaning this test can be used) for the duration of the COVID-19 declaration under Section 564(b)(1) of the Act, 21 U.S.C. section 360bbb-3(b)(1), unless the authorization is terminated or revoked sooner.  Performed at [facility], [place] [address] [place], [place] [address]   Culture, blood (Routine X 2) w Reflex to ID Panel     Status: None (Preliminary result)   Collection Time: 03/26/22  9:50 AM   Specimen: BLOOD  Result Value Ref Range Status   Specimen Description BLOOD BLOOD RIGHT ARM  Final   Special Requests   Final    BOTTLES DRAWN AEROBIC AND ANAEROBIC Blood Culture adequate volume   Culture   Final    NO GROWTH 2 DAYS Performed at [facility], [place] [address], [place], [place] [address]    Report Status PENDING  Incomplete  Culture, blood (Routine X 2) w Reflex to ID Panel     Status: None (Preliminary result)   Collection Time: 03/26/22 10:04 AM   Specimen: BLOOD RIGHT HAND  Result Value Ref Range Status   Specimen Description BLOOD RIGHT HAND  Final   Special Requests   Final    BOTTLES DRAWN AEROBIC AND ANAEROBIC Blood Culture results may not be optimal due to an excessive volume of blood received in culture bottles   Culture   Final    NO GROWTH 2 DAYS Performed at [facility], [place] [address], [place], [place] [address]    Report Status PENDING  Incomplete  Body fluid culture w Gram Stain     Status: None (Preliminary result)    Collection Time: 03/27/22 10:47 AM   Specimen: Synovium; Body Fluid  Result Value Ref Range Status   Specimen Description SYNOVIAL  Final   Special Requests R KNEE  Final   Gram Stain   Final    FEW WBC PRESENT, PREDOMINANTLY PMN NO ORGANISMS SEEN    Culture   Final    NO GROWTH < 24 HOURS Performed at [facility], [address], [place], [place] [address]    Report Status PENDING  Incomplete     Radiology Studies: DG Knee Complete 4 Views Right  Result Date: 03/27/2022 CLINICAL DATA:  Pain and swelling without trauma EXAM: RIGHT KNEE - COMPLETE 4+ VIEW COMPARISON:  None Available. FINDINGS: Vascular calcifications. No acute fracture or dislocation. 3 compartment mild osteoarthritis. Small suprapatellar joint effusion. Enthesophyte at the quadriceps insertion. IMPRESSION: Mild osteoarthritis with small suprapatellar joint effusion. Electronically Signed   By: Abigail Miyamoto M.D.   On: 03/27/2022 12:27     Scheduled Meds:  allopurinol  50 mg Oral Daily   apixaban  5 mg Oral BID   aspirin  81 mg Oral Daily   hydrALAZINE  25 mg Oral TID   insulin aspart  0-9 Units Subcutaneous TID WC   polyethylene glycol  17 g Oral Daily   rosuvastatin  40 mg Oral Daily   senna-docusate  1 tablet Oral BID   sevelamer carbonate  800 mg Oral TID WC   sodium bicarbonate  650 mg Oral BID   Tafamidis  1 capsule Oral Daily   Continuous Infusions:   LOS: 5 days    Time spent: 56mn  PDomenic Polite MD [facility]   03/28/2022, 11:02 AM

## 2022-03-28 NOTE — Progress Notes (Addendum)
[facility] NEPHROLOGY PROGRESS NOTE  Assessment/ Plan: Pt is a 76 y.o. yo male  with HTN, HLD, DM, CVA, A-fib, prostate CA, amyloidosis, combined systolic and diastolic CHF (EF 76-28% w/ gr 2 diastolic dysfn), PEA arrest, chronic pericardial effusion status post pericardial window, CKD stage IV followed by Dr. Royce Macadamia at [facility] p/w worsening shortness of breath and DOE. Recent baseline serum creatinine level seems to be around 3.3-3.9 on torsemide 20 mg 3 times a week at home.  He underwent left brachial artery to cephalic vein AV fistula creation by Dr. Donzetta Matters on 01/31/2022. In the ED, Cr 4.58, CO2 19, BNP 1571, troponin 856, hemoglobin 11.6.  The chest x-ray consistent with diffuse bilateral pulmonary edema.  # Acute on chronic CKD stage IV with fluid overload w/ CKD thought to be due to diabetes and hypertension with recent baseline serum creatinine level around 3.3-3.9 followed by Dr. Harrie Jeans.   Would like to try and avoid starting dialysis in the hospital unless absolutely necessary and the pt agrees. But if necessary he's willing to initiate hemodialysis during this admission.  No absolute indications and he is not uremic and nonoliguric.  [place] 11/10 showed low filling pressures. Would continue holding diuresis. Urine output is reasonable but both BUN and creatinine level trending up.  I think he is intravascularly volume depleted therefore I will start NS for only brief period of time.  Clinically he looks good and sitting on chair.  # Vascular dialysis access: Left upper extremity AV fistula does not look matured therefore he may need HD catheter if dialysis needed.    # Acute on chronic combined CHF: Echo with EF of 30 to 35%, severe LVH. Initially diuresing with IV Lasix and metolazone but onhold with worsening renal function and also low filling pressures on RHC.  Seen by cardiologist.   # Metabolic acidosis: Continue oral sodium bicarbonate.   # Anemia of CKD: Hemoglobin at  goal.  Continue to monitor.   # CKD-MBD: Calcium acceptable, started Renvela for hyperphosphatemia with improvement of phosphorus level. Monitor lab.  #Right knee pain: Seen by Ortho status post arthrocentesis on 11/13 not suggestive of septic arthritis, received intra-articular steroid injection.  Clinically much better today.    I have discussed with the primary team.  Subjective: Seen and examined at bedside.  No knee pain.  He is sitting on chair comfortable.  Denies nausea, vomiting, dysgeusia, chest pain or shortness of breath.  Urine output is recorded around 1.4 L.  No other new event.  Objective Vital signs in last 24 hours: Vitals:   03/27/22 1818 03/27/22 2100 03/28/22 0356 03/28/22 0727  BP: 111/73 133/77  127/73  Pulse: (!) 57 (!) 48 (!) 48 (!) 48  Resp: '20 18 20 20  '$ Temp: 98.7 F (37.1 C) 98.8 F (37.1 C) 98.2 F (36.8 C) 98.5 F (36.9 C)  TempSrc: Oral Oral Oral Oral  SpO2: 98% 99% 97% 98%  Weight:   73.2 kg   Height:       Weight change: 2.2 kg  Intake/Output Summary (Last 24 hours) at 03/28/2022 1105 Last data filed at 03/28/2022 0940 Gross per 24 hour  Intake 480 ml  Output 1925 ml  Net -1445 ml        Labs: RENAL PANEL Recent Labs    08/09/21 1000 08/09/21 1000 09/29/21 1601 10/11/21 1031 01/05/22 1125 01/31/22 0750 03/22/22 1659 03/24/22 0032 03/24/22 1428 03/25/22 0035 03/26/22 0024 03/27/22 0038 03/28/22 0035  NA 143   < >  143 142 140 141 143 140 141  142 140 139 139 136  K 3.5  --  4.0 4.3 4.3 3.8 3.8 3.7 3.6  3.6 3.9 3.4* 3.8 4.0  CL 112  --  108* 106 107 112* 113* 106  --  108 105 104 100  CO2 21  --  19* 22 23  --  19* 21*  --  21* '23 23 23  '$ GLUCOSE 66*  --  89 103* 95 89 78 113*  --  133* 96 102* 260*  BUN 60*  --  66* 68* 56* 52* 70* 76*  --  78* 80* 85* 91*  CREATININE 3.26*  --  3.41* 3.35* 3.75* 3.10* 4.58* 4.93*  --  5.22* 5.26* 5.34* 5.46*  CALCIUM 9.3  --  9.8 9.8 9.8  --  9.6 9.3  9.4  --  9.5 9.3 9.2 9.3  PHOS   --   --   --   --   --   --   --  6.7*  --   --   --   --  5.5*  ALBUMIN  --   --   --   --  3.8  --   --   --   --   --   --   --  2.6*   < > = values in this interval not displayed.      Liver Function Tests: Recent Labs  Lab 03/28/22 0035  ALBUMIN 2.6*   No results for input(s): "LIPASE", "AMYLASE" in the last 168 hours. No results for input(s): "AMMONIA" in the last 168 hours. CBC: Recent Labs    03/23/22 1615 03/24/22 0032 03/24/22 1428 03/25/22 0035 03/27/22 0038 03/28/22 0035  HGB  --  11.2* 11.6*  11.9* 11.5* 9.9* 9.9*  MCV  --  87.7  --  85.8 85.3 86.1  VITAMINB12 848  --   --   --   --   --   FOLATE 14.1  --   --   --   --   --   FERRITIN 95  --   --   --   --   --   TIBC 272  --   --   --   --   --   IRON 52  --   --   --   --   --   RETICCTPCT 0.7  --   --   --   --   --      Cardiac Enzymes: No results for input(s): "CKTOTAL", "CKMB", "CKMBINDEX", "TROPONINI" in the last 168 hours. CBG: Recent Labs  Lab 03/27/22 0603 03/27/22 1127 03/27/22 1541 03/27/22 2108 03/28/22 0616  GLUCAP 104* 141* 169* 249* 218*     Iron Studies: No results for input(s): "IRON", "TIBC", "TRANSFERRIN", "FERRITIN" in the last 72 hours. Studies/Results: DG Knee Complete 4 Views Right  Result Date: 03/27/2022 CLINICAL DATA:  Pain and swelling without trauma EXAM: RIGHT KNEE - COMPLETE 4+ VIEW COMPARISON:  None Available. FINDINGS: Vascular calcifications. No acute fracture or dislocation. 3 compartment mild osteoarthritis. Small suprapatellar joint effusion. Enthesophyte at the quadriceps insertion. IMPRESSION: Mild osteoarthritis with small suprapatellar joint effusion. Electronically Signed   By: Abigail Miyamoto M.D.   On: 03/27/2022 12:27    Medications: Infusions:  sodium chloride      Scheduled Medications:  allopurinol  50 mg Oral Daily   apixaban  5 mg Oral BID   aspirin  81 mg Oral Daily  hydrALAZINE  25 mg Oral TID   insulin aspart  0-9 Units Subcutaneous TID  WC   polyethylene glycol  17 g Oral Daily   rosuvastatin  40 mg Oral Daily   senna-docusate  1 tablet Oral BID   sevelamer carbonate  800 mg Oral TID WC   sodium bicarbonate  650 mg Oral BID   Tafamidis  1 capsule Oral Daily    have reviewed scheduled and prn medications.  Physical Exam: General:NAD, comfortable Heart:RRR, s1s2 nl Lungs:clear b/l, no crackle Abdomen:soft, Non-tender, non-distended Extremities: No peripheral edema. Dialysis Access: Left upper extremity AV fistula has good thrill and bruit.  Corey [name] Prasad Tien Spooner 03/28/2022,11:05 AM  LOS: 5 days

## 2022-03-29 ENCOUNTER — Other Ambulatory Visit (HOSPITAL_COMMUNITY): Payer: Self-pay

## 2022-03-29 ENCOUNTER — Telehealth: Payer: Medicare HMO

## 2022-03-29 DIAGNOSIS — I5043 Acute on chronic combined systolic (congestive) and diastolic (congestive) heart failure: Secondary | ICD-10-CM | POA: Diagnosis not present

## 2022-03-29 LAB — RENAL FUNCTION PANEL
Albumin: 2.6 g/dL — ABNORMAL LOW (ref 3.5–5.0)
Anion gap: 12 (ref 5–15)
BUN: 92 mg/dL — ABNORMAL HIGH (ref 8–23)
CO2: 23 mmol/L (ref 22–32)
Calcium: 9 mg/dL (ref 8.9–10.3)
Chloride: 103 mmol/L (ref 98–111)
Creatinine, Ser: 5.31 mg/dL — ABNORMAL HIGH (ref 0.61–1.24)
GFR, Estimated: 11 mL/min — ABNORMAL LOW (ref >60)
Glucose, Bld: 214 mg/dL — ABNORMAL HIGH (ref 70–99)
Phosphorus: 5.5 mg/dL — ABNORMAL HIGH (ref 2.5–4.6)
Potassium: 3.5 mmol/L (ref 3.5–5.1)
Sodium: 138 mmol/L (ref 135–145)

## 2022-03-29 LAB — GLUCOSE, CAPILLARY
Glucose-Capillary: 217 mg/dL — ABNORMAL HIGH (ref 70–99)
Glucose-Capillary: 36 mg/dL — CL (ref 70–99)
Glucose-Capillary: 63 mg/dL — ABNORMAL LOW (ref 70–99)
Glucose-Capillary: 52 mg/dL — ABNORMAL LOW (ref 70–99)
Glucose-Capillary: 54 mg/dL — ABNORMAL LOW (ref 70–99)
Glucose-Capillary: 106 mg/dL — ABNORMAL HIGH (ref 70–99)
Glucose-Capillary: 126 mg/dL — ABNORMAL HIGH (ref 70–99)

## 2022-03-29 MED ORDER — SODIUM CHLORIDE 0.9 % IV SOLN
250.0000 mg | Freq: Every day | INTRAVENOUS | Status: DC
  Administered 2022-03-29: 250 mg via INTRAVENOUS
  Filled 2022-03-29: qty 20

## 2022-03-29 MED ORDER — ROSUVASTATIN CALCIUM 5 MG PO TABS
5.0000 mg | ORAL_TABLET | Freq: Every day | ORAL | 0 refills | Status: DC
  Filled 2022-03-29: qty 30, 30d supply, fill #0

## 2022-03-29 MED ORDER — DEXTROSE 50 % IV SOLN
INTRAVENOUS | Status: AC
  Filled 2022-03-29: qty 50

## 2022-03-29 MED ORDER — TORSEMIDE 20 MG PO TABS
20.0000 mg | ORAL_TABLET | Freq: Every day | ORAL | 0 refills | Status: DC | PRN
  Filled 2022-03-29: qty 30, 30d supply, fill #0

## 2022-03-29 MED ORDER — SODIUM BICARBONATE 650 MG PO TABS
650.0000 mg | ORAL_TABLET | Freq: Two times a day (BID) | ORAL | 0 refills | Status: DC
  Filled 2022-03-29: qty 60, 30d supply, fill #0

## 2022-03-29 MED ORDER — HYDRALAZINE HCL 25 MG PO TABS
25.0000 mg | ORAL_TABLET | Freq: Three times a day (TID) | ORAL | 0 refills | Status: DC
  Filled 2022-03-29: qty 90, 30d supply, fill #0

## 2022-03-29 MED ORDER — DARBEPOETIN ALFA 60 MCG/0.3ML IJ SOSY
60.0000 ug | PREFILLED_SYRINGE | Freq: Once | INTRAMUSCULAR | Status: AC
  Administered 2022-03-29: 60 ug via SUBCUTANEOUS
  Filled 2022-03-29 (×2): qty 0.3

## 2022-03-29 MED ORDER — DEXTROSE 50 % IV SOLN
50.0000 mL | INTRAVENOUS | Status: AC
  Administered 2022-03-29: 50 mL via INTRAVENOUS

## 2022-03-29 MED ORDER — ASPIRIN 81 MG PO CHEW: 81.0000 mg | CHEWABLE_TABLET | Freq: Every day | ORAL | Status: DC

## 2022-03-29 MED ORDER — ROSUVASTATIN CALCIUM 5 MG PO TABS: 5.0000 mg | ORAL_TABLET | Freq: Every day | ORAL | Status: DC

## 2022-03-29 NOTE — Progress Notes (Addendum)
Pt with low cbg readings, asymptomatic, pt ambulating independently with no signs of distress, spouse at bedside, juice, d50 and lunch given to pt, we will recheck. MD notified of pending d/c and low cbgs and interventions.

## 2022-03-29 NOTE — Progress Notes (Signed)
Pt's home meds given back to pt and wife, d/c instructions discussed with pt and wife, vs wnl, piv d/c and pressure dressing applied.

## 2022-03-29 NOTE — Discharge Summary (Signed)
Physician Discharge Summary  Corey [name] YTK:[id number] DOB: 25-Oct-1945 DOA: 03/22/2022  PCP: [name], MD  Admit date: 03/22/2022 Discharge date: 03/29/2022  Time spent: 45 minutes  Recommendations for Outpatient Follow-up:  Nephrology Dr. Royce Macadamia in 1 to 2 weeks, [facility] to call patient with appointment Advanced heart failure team for cardiac amyloidosis in 1 to 2 months   Discharge Diagnoses:  Principal Problem:   Acute on chronic combined systolic and diastolic heart failure (HCC) Active Problems:   Chronic kidney disease (CKD), stage IV (severe) ([place])   Essential hypertension   Diabetes ([place])   S/P pericardial window creation   Hereditary cardiac amyloidosis ([place])   Bilateral edema of lower extremity   Paroxysmal atrial fibrillation ([place])   Acute on chronic congestive heart failure ([place]) Gout flare  Discharge Condition: Stable  Diet recommendation: Low-sodium diabetic  Filed Weights   03/27/22 0614 03/28/22 0356 03/29/22 0447  Weight: 71 kg 73.2 kg 72.3 kg    History of present illness:  76/M with history of atrial fibrillation on Eliquis, history of CVA, chronic systolic and diastolic CHF, EF 55-73%, cardiac amyloidosis, CKD 4-5, status post creation of left arm AV fistula on 9/19, chronic right leg wound followed at the wound center presented to the ED from cardiology clinic with worsening dyspnea on exertion, orthopnea, edema. -In the ED BNP was 1571, chest x-ray noted bilateral interstitial edema, BUN was 70, creatinine 4.5, EKG noted atrial flutter  Hospital Course:   Acute on chronic combined systolic and diastolic heart failure  Known cardiac amyloidosis, positive genetic testing for ATTR amyloid -diuresed well w/ IV lasix, 9.3 L negative -repeat ECHO w/ EF 30-35%, severe LVH -Seen by heart failure team and nephrology -has a left arm AV fistula placed 9/19 -RHC with low filling pressures, diuretics held -Nephrology following, hope to  avoid dialysis this admission if possible, aVF not mature yet -Creatinine stable, continue hydralazine, -Per nephrology okay to discharge home today on torsemide 20 Mg daily PRN for 2 pound weight gain or edema, he will have a close follow-up with Dr. Royce Macadamia, anticipate need to start hemodialysis in the next 1 to 2 months   Elevated troponin -Due to demand ischemia, do not suspect ACS   AKI on chronic kidney disease (CKD), stage IV (severe) ([place]) Pt has left UE AVF that was placed in 01/31/2022. -Creatinine worse than baseline,  baseline around 3.5, diuresed initially, RHC noted low filling pressures, then diuretics held -nephrology following, no plans for HD yet -given IV fluids yesterday -Creatinine plateauing, now 5.3,  -See discussion above, felt to be stable for discharge home today, nephrology recommends torsemide 20 Mg PRN for weight gain/edema   Fever, 11/11-12 Right knee pain, gout flare -Appreciate Ortho eval, underwent arthrocentesis yesterday, synovial fluid not suggestive of septic arthritis, status post intra-articular steroid injection, Gram stain negative, cultures negative X 2 days -Blood cultures negative, right lower leg small wound with clean base not infected -Resolved   Paroxysmal atrial fibrillation (HCC) Currently in atrial flutter.  -Rate controlled. Continue with Eliquis.   Right lower leg chronic wound -Managed at the wound center and with home health RN, changed 11/9 by [facility] team, small dime sized with reddish-pink granulation tissue   Hereditary cardiac amyloidosis ([place]) On tafamidis.  -Follow-up with heart failure team   S/P pericardial window creation -no effusion noted now   Diabetes ([place]) Stable.,  A1c is 5.7   Essential hypertension Stable.    Consultants: Cards, renal, orthopedics  Procedures: Arthrocentesis and intra-articular steroid injection 11/13  Discharge Exam: Vitals:   03/29/22 0728 03/29/22 0749  BP: 118/69 118/69  Pulse:  (!) 41 (!) 41  Resp: 20 18  Temp: 97.8 F (36.6 C) 97.8 F (36.6 C)  SpO2: 100%    Gen: Pleasant elderly male sitting up in bed, AAOx3, no distress HEENT: No JVD CVS: S1-S2, regular rhythm Lungs: Clear bilaterally Abdomen: Soft, nontender, bowel sounds present Extremities: No edema, left arm AV fistula, right knee warmth and swelling improved  skin: Right lower leg with dime sized wound, pinkish-red granulation tissue, no purulence or drainage  Psychiatry:  Mood & affect appropriate.  Discharge Instructions   Discharge Instructions     Diet - low sodium heart healthy   Complete by: As directed    Increase activity slowly   Complete by: As directed    No wound care   Complete by: As directed       Allergies as of 03/29/2022       Reactions   Hydrochlorothiazide Other (See Comments)   Gout , uncontrolled diabetes and renal insufficiency        Medication List     STOP taking these medications    Januvia 25 MG tablet Generic drug: sitaGLIPtin   nitroGLYCERIN 0.4 MG SL tablet Commonly known as: NITROSTAT       TAKE these medications    Accu-Chek Aviva Plus test strip Generic drug: glucose blood Use as instructed   Accu-Chek Aviva Plus test strip Generic drug: glucose blood TEST BLOOD SUGAR TWICE DAILY AS DIRECTED   albuterol 108 (90 Base) MCG/ACT inhaler Commonly known as: VENTOLIN HFA Inhale 2 puffs into the lungs every 4 (four) hours as needed for wheezing or shortness of breath.   allopurinol 100 MG tablet Commonly known as: ZYLOPRIM TAKE 1/2 TABLET ONE TIME DAILY What changed:  how much to take how to take this when to take this reasons to take this additional instructions   apixaban 5 MG Tabs tablet Commonly known as: ELIQUIS Take 1 tablet (5 mg total) by mouth 2 (two) times daily.   bismuth subsalicylate 408 XK/48JE suspension Commonly known as: PEPTO BISMOL Take 30 mLs by mouth every 6 (six) hours as needed for indigestion.    colchicine 0.6 MG tablet Commonly known as: Colcrys Take 2 tabs once and then one tab one hour later as needed for gout   ergocalciferol 1.25 MG (50000 UT) capsule Commonly known as: VITAMIN D2 Take 50,000 Units by mouth once a week. Monday   hydrALAZINE 25 MG tablet Commonly known as: APRESOLINE Take 1 tablet (25 mg total) by mouth 3 (three) times daily. What changed:  medication strength how much to take   rosuvastatin 5 MG tablet Commonly known as: CRESTOR Take 1 tablet (5 mg total) by mouth daily. Start taking on: March 30, 2022   sodium bicarbonate 650 MG tablet Take 1 tablet (650 mg total) by mouth 2 (two) times daily.   torsemide 20 MG tablet Commonly known as: DEMADEX Take 1 tablet (20 mg total) by mouth daily as needed (for 2 pound weight gain or swelling). Patient take 20 mg tablet by mouth every Monday, Wednesday, and Friday May take 1 additional tablet for swelling in legs What changed:  how much to take how to take this when to take this reasons to take this   TRUEplus Lancets 33G Misc USE 4 (FOUR) TIMES DAILY   Vyndamax 61 MG Caps Generic drug: Tafamidis Take 61 mg  by mouth daily.       Allergies  Allergen Reactions   Hydrochlorothiazide Other (See Comments)    Gout , uncontrolled diabetes and renal insufficiency    Follow-up Information     [facility] Follow up on 04/05/2022.   Specialty: Cardiology Why: at 9:40 Contact information: [address] [id number] [place] [address] [phone]        Claudia Desanctis, MD. Schedule an appointment as soon as possible for a visit in 2 week(s).   Specialty: Nephrology Contact information: [place] [address] [phone]                  The results of significant diagnostics from this hospitalization (including imaging, microbiology, ancillary and laboratory) are listed below for reference.     Significant Diagnostic Studies: DG Knee Complete 4 Views Right  Result Date: 03/27/2022 CLINICAL DATA:  Pain and swelling without trauma EXAM: RIGHT KNEE - COMPLETE 4+ VIEW COMPARISON:  None Available. FINDINGS: Vascular calcifications. No acute fracture or dislocation. 3 compartment mild osteoarthritis. Small suprapatellar joint effusion. Enthesophyte at the quadriceps insertion. IMPRESSION: Mild osteoarthritis with small suprapatellar joint effusion. Electronically Signed   By: Abigail Miyamoto M.D.   On: 03/27/2022 12:27   CARDIAC CATHETERIZATION  Result Date: 03/24/2022 HEMODYNAMICS: RA:   5 mmHg (mean) RV:   54/5 mmHg PA:   52/18 mmHg (31 mean) PCWP:  10-12 mmHg with V waves to 15    Estimated Fick CO/CI   5.4 L/min, 2.9 L/min/m2    PVR     3.7 Wood Units PAPi      >6  IMPRESSION: 1.Low left and right heart filling pressures. 2.PCWP V waves secondary to poor left atrial compliance. 3.Normal cardiac output/index; possibly elevated due to AV fistula. 4.Mildly elevated PVR. RECOMMENDATIONS: -Hold further diuretics   CT HEAD WO CONTRAST (5MM)  Result Date: 03/23/2022 CLINICAL DATA:  Acute neuro deficit. Slurred speech. Patient on dialysis. EXAM: CT HEAD WITHOUT CONTRAST TECHNIQUE: Contiguous axial images were obtained from the base of the skull through the vertex without intravenous contrast. RADIATION DOSE REDUCTION: This exam was performed according to the departmental dose-optimization program which includes automated exposure control, adjustment of the mA and/or kV according to patient size and/or use of iterative reconstruction technique. COMPARISON:  CT head 09/19/2012.  MRI head with contrast 01/07/2015 FINDINGS: Brain: Extensive and diffuse dural ossification involving the falx and the dura around the cerebral hemispheres bilaterally. There is also involvement of the tentorium. This shows mild progression since 2008. There is diffuse dural enhancement on the prior MRI. No brain edema  identified. Mild atrophy. Ventricle size normal. Mild white matter hypodensity bilaterally. Negative for acute infarct or hemorrhage. Vascular: Negative for hyperdense vessel Skull: No focal skeletal lesion. Sinuses/Orbits: Paranasal sinuses clear.  Left cataract extraction Other: None IMPRESSION: 1. No acute intracranial abnormality. Mild atrophy and mild chronic microvascular ischemic change in the white matter. 2. Extensive and diffuse dural ossification. This has progressed since 2008. Consider metabolic disorder. Correlate with serum calcium and parathyroid hormone level. Diffuse meningioma also possible. Recommend follow-up MRI head without and with contrast for comparison with 2016. Electronically Signed   By: Franchot Gallo M.D.   On: 03/23/2022 15:14   ECHOCARDIOGRAM COMPLETE  Result Date: 03/23/2022    ECHOCARDIOGRAM REPORT   Patient Name:   Corey [name] Date of Exam: 03/23/2022 Medical Rec #:  [id number]    Height:  66.0 in Accession #:    [id number]   Weight:       178.0 lb Date of Birth:  1945-08-03    BSA:          1.903 m Patient Age:    69 years     BP:           136/122 mmHg Patient Gender: M            HR:           48 bpm. Exam Location:  Inpatient Procedure: 2D Echo, Cardiac Doppler, Color Doppler and Intracardiac            Opacification Agent Indications:    CHF-Acute Systolic I50.27  History:        Patient has prior history of Echocardiogram examinations, most                 recent 09/30/2020. CAD; Arrythmias:Bradycardia with first-degree                 block. Chronic systolic heart failure/cardiac amyloidosis.                 History of pericardial effusion s/p pericardial window.  Sonographer:    Darlina Sicilian RDCS Referring Phys: [place]  1. Speckled appearance to LV myocardium consistent with diagnosis of amyloid Global hypokinesis worse in the posterior lateral wall . Left ventricular ejection fraction, by estimation, is 30 to 35%. The left ventricle has  moderately decreased function. The left ventricle demonstrates global hypokinesis. There is severe left ventricular hypertrophy. Left ventricular diastolic parameters are indeterminate. Elevated left ventricular end-diastolic pressure.  2. Right ventricular systolic function is normal. The right ventricular size is normal. There is moderately elevated pulmonary artery systolic pressure.  3. Left atrial size was moderately dilated.  4. Right atrial size was moderately dilated.  5. The mitral valve is normal in structure. Mild mitral valve regurgitation. No evidence of mitral stenosis.  6. The aortic valve is tricuspid. There is mild calcification of the aortic valve. There is mild thickening of the aortic valve. Aortic valve regurgitation is not visualized. Aortic valve sclerosis is present, with no evidence of aortic valve stenosis.  7. Aortic dilatation noted. There is moderate dilatation of the ascending aorta, measuring 41 mm.  8. The inferior vena cava is dilated in size with <50% respiratory variability, suggesting right atrial pressure of 15 mmHg. FINDINGS  Left Ventricle: Speckled appearance to LV myocardium consistent with diagnosis of amyloid Global hypokinesis worse in the posterior lateral wall. Left ventricular ejection fraction, by estimation, is 30 to 35%. The left ventricle has moderately decreased function. The left ventricle demonstrates global hypokinesis. Definity contrast agent was given IV to delineate the left ventricular endocardial borders. The left ventricular internal cavity size was normal in size. There is severe left ventricular hypertrophy. Left ventricular diastolic parameters are indeterminate. Elevated left ventricular end-diastolic pressure. Right Ventricle: The right ventricular size is normal. No increase in right ventricular wall thickness. Right ventricular systolic function is normal. There is moderately elevated pulmonary artery systolic pressure. The tricuspid regurgitant  velocity is 2.86 m/s, and with an assumed right atrial pressure of 15 mmHg, the estimated right ventricular systolic pressure is 74.1 mmHg. Left Atrium: Left atrial size was moderately dilated. Right Atrium: Right atrial size was moderately dilated. Pericardium: There is no evidence of pericardial effusion. Mitral Valve: The mitral valve is normal in structure. Mild mitral valve regurgitation. No evidence of mitral valve  stenosis. Tricuspid Valve: The tricuspid valve is normal in structure. Tricuspid valve regurgitation is mild . No evidence of tricuspid stenosis. Aortic Valve: The aortic valve is tricuspid. There is mild calcification of the aortic valve. There is mild thickening of the aortic valve. Aortic valve regurgitation is not visualized. Aortic valve sclerosis is present, with no evidence of aortic valve stenosis. Pulmonic Valve: The pulmonic valve was normal in structure. Pulmonic valve regurgitation is mild. No evidence of pulmonic stenosis. Aorta: Aortic dilatation noted. There is moderate dilatation of the ascending aorta, measuring 41 mm. Venous: The inferior vena cava is dilated in size with less than 50% respiratory variability, suggesting right atrial pressure of 15 mmHg. IAS/Shunts: No atrial level shunt detected by color flow Doppler.  LEFT VENTRICLE PLAX 2D LVIDd:         4.70 cm      Diastology LVIDs:         3.60 cm      LV e' medial:    5.66 cm/s LV PW:         1.70 cm      LV E/e' medial:  18.9 LV IVS:        2.15 cm      LV e' lateral:   7.29 cm/s LVOT diam:     2.20 cm      LV E/e' lateral: 14.7 LV SV:         79 LV SV Index:   41 LVOT Area:     3.80 cm  LV Volumes (MOD) LV vol d, MOD A2C: 244.0 ml LV vol d, MOD A4C: 187.5 ml LV vol s, MOD A2C: 133.0 ml LV vol s, MOD A4C: 111.0 ml LV SV MOD A2C:     111.0 ml LV SV MOD A4C:     187.5 ml LV SV MOD BP:      92.3 ml RIGHT VENTRICLE RV S prime:     5.00 cm/s TAPSE (M-mode): 1.6 cm LEFT ATRIUM             Index        RIGHT ATRIUM            Index LA diam:        4.50 cm 2.36 cm/m   RA Area:     23.10 cm LA Vol (A2C):   87.3 ml 45.87 ml/m  RA Volume:   67.60 ml  35.52 ml/m LA Vol (A4C):   61.9 ml 32.52 ml/m LA Biplane Vol: 75.4 ml 39.62 ml/m  AORTIC VALVE             PULMONIC VALVE LVOT Vmax:   103.00 cm/s PR End Diast Vel: 8.88 msec LVOT Vmean:  76.600 cm/s LVOT VTI:    0.207 m  AORTA Ao Root diam: 2.90 cm Ao Asc diam:  4.05 cm MITRAL VALVE                TRICUSPID VALVE MV Area (PHT): 3.91 cm     TR Peak grad:   32.7 mmHg MV Decel Time: 194 msec     TR Vmax:        286.00 cm/s MV E velocity: 107.00 cm/s                             SHUNTS  Systemic VTI:  0.21 m                             Systemic Diam: 2.20 cm Jenkins Rouge MD Electronically signed by Jenkins Rouge MD Signature Date/Time: 03/23/2022/2:24:17 PM    Final    DG Chest 2 View  Result Date: 03/22/2022 CLINICAL DATA:  Shortness of breath EXAM: CHEST - 2 VIEW COMPARISON:  01/05/2022 FINDINGS: Gross cardiomegaly. Diffuse bilateral interstitial pulmonary opacity. The visualized skeletal structures are unremarkable. IMPRESSION: Gross cardiomegaly with diffuse bilateral interstitial pulmonary opacity, likely edema. No focal airspace opacity. Electronically Signed   By: Delanna Ahmadi M.D.   On: 03/22/2022 16:56   VAS US DUPLEX DIALYSIS ACCESS (AVF, AVG)  Result Date: 03/15/2022 DIALYSIS ACCESS Patient Name:  Corey [name]  Date of Exam:   03/15/2022 Medical Rec #: [id number]     Accession #:    [id number] Date of Birth: 04-Jan-1946     Patient Gender: M Patient Age:   20 years Exam Location:  [facility] Procedure:      VAS US DUPLEX DIALYSIS ACCESS (AVF, AVG) Referring Phys: Karoline Caldwell --------------------------------------------------------------------------------  Reason for Exam: Routine follow up. Access Site: Left Upper Extremity. Access Type: Brachial-cephalic AVF. History: 01/31/22 Left Arm brachial cephalic AVF creation by Dr Donzetta Matters.  Performing Technologist: Elta Guadeloupe RVT, RDMS  Examination Guidelines: A complete evaluation includes B-mode imaging, spectral Doppler, color Doppler, and power Doppler as needed of all accessible portions of each vessel. Unilateral testing is considered an integral part of a complete examination. Limited examinations for reoccurring indications may be performed as noted.  Findings: +--------------------+----------+-----------------+--------+ AVF                 PSV (cm/s)Flow Vol (mL/min)Comments +--------------------+----------+-----------------+--------+ Native artery inflow   151           607                +--------------------+----------+-----------------+--------+ AVF Anastomosis        333                              +--------------------+----------+-----------------+--------+  +------------+----------+-------------+----------+-------------------+ OUTFLOW VEINPSV (cm/s)Diameter (cm)Depth (cm)     Describe       +------------+----------+-------------+----------+-------------------+ Prox UA        167        0.50        0.21                       +------------+----------+-------------+----------+-------------------+ Mid UA         329        0.43        0.16                       +------------+----------+-------------+----------+-------------------+ Dist UA        490        0.42        0.12   partially-occlusive +------------+----------+-------------+----------+-------------------+ AC Fossa       190        0.62        0.14   partially-occlusive +------------+----------+-------------+----------+-------------------+   Summary: Arteriovenous fistula-Thrombus noted in the ACF and distal upper arm. Alternatively this structure could represent intimal thickening or partially occlusive wall adherent thrombus.  LT AVF is patent  *See table(s) above for measurements and observations.  Diagnosing physician: Servando Snare MD Electronically signed by Servando Snare MD on  03/15/2022 at 11:42:39 AM.   --------------------------------------------------------------------------------   Final     Microbiology: Recent Results (from the past 240 hour(s))  SARS Coronavirus 2 by RT PCR (hospital order, performed in [facility] hospital lab) *cepheid single result test* Anterior Nasal Swab     Status: None   Collection Time: 03/26/22  9:18 AM   Specimen: Anterior Nasal Swab  Result Value Ref Range Status   SARS Coronavirus 2 by RT PCR NEGATIVE NEGATIVE Final    Comment: (NOTE) SARS-CoV-2 target nucleic acids are NOT DETECTED.  The SARS-CoV-2 RNA is generally detectable in upper and lower respiratory specimens during the acute phase of infection. The lowest concentration of SARS-CoV-2 viral copies this assay can detect is 250 copies / mL. A negative result does not preclude SARS-CoV-2 infection and should not be used as the sole basis for treatment or other patient management decisions.  A negative result may occur with improper specimen collection / handling, submission of specimen other than nasopharyngeal swab, presence of viral mutation(s) within the areas targeted by this assay, and inadequate number of viral copies (<250 copies / mL). A negative result must be combined with clinical observations, patient history, and epidemiological information.  Fact Sheet for Patients:   https://www.patel.info/  Fact Sheet for Healthcare Providers: https://hall.com/  This test is not yet approved or  cleared by the [place] FDA and has been authorized for detection and/or diagnosis of SARS-CoV-2 by FDA under an Emergency Use Authorization (EUA).  This EUA will remain in effect (meaning this test can be used) for the duration of the COVID-19 declaration under Section 564(b)(1) of the Act, 21 U.S.C. section 360bbb-3(b)(1), unless the authorization is terminated or revoked sooner.  Performed at [facility], [place] [address] [place], [place] [address]   Culture, blood (Routine X 2) w Reflex to ID Panel     Status: None (Preliminary result)   Collection Time: 03/26/22  9:50 AM   Specimen: BLOOD  Result Value Ref Range Status   Specimen Description BLOOD BLOOD RIGHT ARM  Final   Special Requests   Final    BOTTLES DRAWN AEROBIC AND ANAEROBIC Blood Culture adequate volume   Culture   Final    NO GROWTH 2 DAYS Performed at [facility], [place] [address] [place], [place] [address]    Report Status PENDING  Incomplete  Culture, blood (Routine X 2) w Reflex to ID Panel     Status: None (Preliminary result)   Collection Time: 03/26/22 10:04 AM   Specimen: BLOOD RIGHT HAND  Result Value Ref Range Status   Specimen Description BLOOD RIGHT HAND  Final   Special Requests   Final    BOTTLES DRAWN AEROBIC AND ANAEROBIC Blood Culture results may not be optimal due to an excessive volume of blood received in culture bottles   Culture   Final    NO GROWTH 2 DAYS Performed at [facility], [place] [address], [place], [place] [address]    Report Status PENDING  Incomplete  Body fluid culture w Gram Stain     Status: None (Preliminary result)   Collection Time: 03/27/22 10:47 AM   Specimen: Synovium; Body Fluid  Result Value Ref Range Status   Specimen Description SYNOVIAL  Final   Special Requests R KNEE  Final   Gram Stain   Final    FEW WBC PRESENT, PREDOMINANTLY PMN NO  ORGANISMS SEEN    Culture   Final    NO GROWTH 2 DAYS Performed at [facility], [place] [address], [place], [place] [address]    Report Status PENDING  Incomplete     Labs: Basic Metabolic Panel: Recent Labs  Lab 03/24/22 0032 03/24/22 1428 03/25/22 0035 03/26/22 0024 03/27/22 0038 03/28/22 0035 03/29/22 0050  NA 140   < > 140 139 139 136 138  K 3.7   < > 3.9 3.4* 3.8 4.0 3.5  CL 106  --  108 105 104 100 103  CO2 21*  --  21* '23 23 23 23  '$ GLUCOSE 113*  --  133* 96 102* 260* 214*  BUN 76*  --  78* 80* 85* 91*  92*  CREATININE 4.93*  --  5.22* 5.26* 5.34* 5.46* 5.31*  CALCIUM 9.3  9.4  --  9.5 9.3 9.2 9.3 9.0  PHOS 6.7*  --   --   --   --  5.5* 5.5*   < > = values in this interval not displayed.   Liver Function Tests: Recent Labs  Lab 03/28/22 0035 03/29/22 0050  ALBUMIN 2.6* 2.6*   No results for input(s): "LIPASE", "AMYLASE" in the last 168 hours. No results for input(s): "AMMONIA" in the last 168 hours. CBC: Recent Labs  Lab 03/22/22 1659 03/24/22 0032 03/24/22 1428 03/25/22 0035 03/27/22 0038 03/28/22 0035  WBC 2.7* 2.7*  --  4.8 4.7 4.1  NEUTROABS 1.6*  --   --   --   --   --   HGB 11.6* 11.2* 11.6*  11.9* 11.5* 9.9* 9.9*  HCT 36.4* 35.5* 34.0*  35.0* 34.5* 30.7* 30.9*  MCV 87.7 87.7  --  85.8 85.3 86.1  PLT 103* 117*  --  120* 104* 112*   Cardiac Enzymes: No results for input(s): "CKTOTAL", "CKMB", "CKMBINDEX", "TROPONINI" in the last 168 hours. BNP: BNP (last 3 results) Recent Labs    03/22/22 1659  BNP 1,571.3*    ProBNP (last 3 results) Recent Labs    01/05/22 1125  PROBNP 1,740.0*    CBG: Recent Labs  Lab 03/28/22 0616 03/28/22 1110 03/28/22 1607 03/28/22 2128 03/29/22 0608  GLUCAP 218* 153* 128* 136* 217*       Signed:  Domenic Polite MD.  [facility] 03/29/2022, 10:43 AM

## 2022-03-29 NOTE — Progress Notes (Signed)
PROGRESS NOTE    Corey [name]  PPJ:[id number] DOB: May 26, 1945 DOA: 03/22/2022 PCP: [name], MD  75/M with history of atrial fibrillation on Eliquis, history of CVA, chronic systolic and diastolic CHF, EF 58-09%, cardiac amyloidosis, CKD 4-5, status post creation of left arm AV fistula on 9/19, chronic right leg wound followed at the wound center presented to the ED from cardiology clinic with worsening dyspnea on exertion, orthopnea, edema. -In the ED BNP was 1571, chest x-ray noted bilateral interstitial edema, BUN was 70, creatinine 4.5, EKG noted atrial flutter -Diuresed with IV Lasix with brisk response -Right heart cath 11/10: Low filling pressures, diuretics held -11/12 low-grade fevers overnight,  -11/13 with right knee pain,-gout flare, status post arthrocentesis and intra-articular steroid injection 11/14, given small bolus of fluids back  Subjective: -Feels fair, no events overnight, appetite is poor, + anorexia, no nausea or vomiting  Assessment and Plan:  Acute on chronic combined systolic and diastolic heart failure  Known cardiac amyloidosis, positive genetic testing for ATTR amyloid -diuresed well w/ IV lasix, 9.3 L negative -repea ECHO w/ EF 30-35%, severe LVH -appreciate Renal input, has a left arm AV fistula placed 9/19 -RHC with low filling pressures, diuretics held -Nephrology following, hope to avoid dialysis this admission if possible, aVF not mature yet -Creatinine stable, continue hydralazine, avoid hypotension -BUN remains elevated, given gentle IVF yesterday -Per renal, BMP in a.m.   Elevated troponin -Due to demand ischemia, do not suspect ACS   AKI on chronic kidney disease (CKD), stage IV (severe) (HCC) Pt has left UE AVF that was placed in 01/31/2022. -Creatinine worse than baseline,  baseline around 3.5, diuresed initially, RHC noted low filling pressures, now diuretics on hold -nephrology following, no plans for HD yet -given IV fluids  yesterday -Creatinine plateauing, now 5.3, BUN remains considerably elevated at 92 -Per nephrology  Fever, 11/11-12 Right knee pain, gout flare -Appreciate Ortho eval, underwent arthrocentesis yesterday, synovial fluid not suggestive of septic arthritis, status post intra-articular steroid injection, Gram stain negative, cultures negative X 1 day -Blood cultures negative, right lower leg small wound with clean base not infected -Resolved   Paroxysmal atrial fibrillation (HCC) Currently in atrial flutter.  -Rate controlled. Continue with Eliquis.   Right lower leg chronic wound -Managed at the wound center and with home health RN, changed 11/9 by [place] team, small dime sized with reddish-pink granulation tissue   Hereditary cardiac amyloidosis ([place]) On tafamidis.    S/P pericardial window creation -no effusion noted now   Diabetes (HCC) Stable.,  A1c is 5.7, continue sliding scale insulin   Essential hypertension Stable.    DVT prophylaxis: Eliquis Code Status: Full Code Family Communication: None present at bedside today  disposition Plan: Home likely 48 hours if stable  Consultants: Cards, renal, orthopedics   Procedures: Arthrocentesis and intra-articular steroid injection 11/13  Antimicrobials:    Objective: Vitals:   03/28/22 2030 03/29/22 0447 03/29/22 0728 03/29/22 0749  BP: 120/70 116/65 118/69 118/69  Pulse: (!) 40 (!) 43 (!) 41 (!) 41  Resp: '20  20 18  '$ Temp: 97.8 F (36.6 C) 97.6 F (36.4 C) 97.8 F (36.6 C) 97.8 F (36.6 C)  TempSrc: Oral Oral Oral   SpO2: 97% 100% 100%   Weight:  72.3 kg    Height:        Intake/Output Summary (Last 24 hours) at 03/29/2022 1004 Last data filed at 03/29/2022 0917 Gross per 24 hour  Intake 1925.98 ml  Output 1600 ml  Net 325.98 ml   Filed Weights   03/27/22 0614 03/28/22 0356 03/29/22 0447  Weight: 71 kg 73.2 kg 72.3 kg    Examination:  Gen: Pleasant elderly male sitting up in bed, AAOx3, no  distress HEENT: No JVD CVS: S1-S2, regular rhythm Lungs: Clear bilaterally Abdomen: Soft, nontender, bowel sounds present Extremities: No edema, left arm AV fistula, right knee warmth and swelling improved  skin: Right lower leg with dime sized wound, pinkish-red granulation tissue, no purulence or drainage  Psychiatry:  Mood & affect appropriate.     Data Reviewed:   CBC: Recent Labs  Lab 03/22/22 1659 03/24/22 0032 03/24/22 1428 03/25/22 0035 03/27/22 0038 03/28/22 0035  WBC 2.7* 2.7*  --  4.8 4.7 4.1  NEUTROABS 1.6*  --   --   --   --   --   HGB 11.6* 11.2* 11.6*  11.9* 11.5* 9.9* 9.9*  HCT 36.4* 35.5* 34.0*  35.0* 34.5* 30.7* 30.9*  MCV 87.7 87.7  --  85.8 85.3 86.1  PLT 103* 117*  --  120* 104* 734*   Basic Metabolic Panel: Recent Labs  Lab 03/24/22 0032 03/24/22 1428 03/25/22 0035 03/26/22 0024 03/27/22 0038 03/28/22 0035 03/29/22 0050  NA 140   < > 140 139 139 136 138  K 3.7   < > 3.9 3.4* 3.8 4.0 3.5  CL 106  --  108 105 104 100 103  CO2 21*  --  21* '23 23 23 23  '$ GLUCOSE 113*  --  133* 96 102* 260* 214*  BUN 76*  --  78* 80* 85* 91* 92*  CREATININE 4.93*  --  5.22* 5.26* 5.34* 5.46* 5.31*  CALCIUM 9.3  9.4  --  9.5 9.3 9.2 9.3 9.0  PHOS 6.7*  --   --   --   --  5.5* 5.5*   < > = values in this interval not displayed.   GFR: Estimated Creatinine Clearance: 11.2 mL/min (A) (by C-G formula based on SCr of 5.31 mg/dL (H)). Liver Function Tests: Recent Labs  Lab 03/28/22 0035 03/29/22 0050  ALBUMIN 2.6* 2.6*   No results for input(s): "LIPASE", "AMYLASE" in the last 168 hours. No results for input(s): "AMMONIA" in the last 168 hours. Coagulation Profile: No results for input(s): "INR", "PROTIME" in the last 168 hours. Cardiac Enzymes: No results for input(s): "CKTOTAL", "CKMB", "CKMBINDEX", "TROPONINI" in the last 168 hours. BNP (last 3 results) Recent Labs    01/05/22 1125  PROBNP 1,740.0*   HbA1C: No results for input(s): "HGBA1C" in the  last 72 hours.  CBG: Recent Labs  Lab 03/28/22 0616 03/28/22 1110 03/28/22 1607 03/28/22 2128 03/29/22 0608  GLUCAP 218* 153* 128* 136* 217*   Lipid Profile: No results for input(s): "CHOL", "HDL", "LDLCALC", "TRIG", "CHOLHDL", "LDLDIRECT" in the last 72 hours.  Thyroid Function Tests: No results for input(s): "TSH", "T4TOTAL", "FREET4", "T3FREE", "THYROIDAB" in the last 72 hours. Anemia Panel: No results for input(s): "VITAMINB12", "FOLATE", "FERRITIN", "TIBC", "IRON", "RETICCTPCT" in the last 72 hours.  Urine analysis:    Component Value Date/Time   COLORURINE YELLOW 09/25/2020 2131   APPEARANCEUR CLEAR 09/25/2020 2131   LABSPEC 1.022 09/25/2020 2131   PHURINE 5.0 09/25/2020 2131   GLUCOSEU >=500 (A) 09/25/2020 2131   GLUCOSEU NEGATIVE 07/20/2014 1055   HGBUR NEGATIVE 09/25/2020 2131   HGBUR negative 08/28/2008 1446   BILIRUBINUR NEGATIVE 09/25/2020 2131   BILIRUBINUR Neg 10/28/2014 0916   KETONESUR NEGATIVE 09/25/2020 2131   PROTEINUR NEGATIVE 09/25/2020 2131  UROBILINOGEN 0.2 10/28/2014 0916   UROBILINOGEN 0.2 07/20/2014 1055   NITRITE NEGATIVE 09/25/2020 2131   LEUKOCYTESUR SMALL (A) 09/25/2020 2131   Sepsis Labs: '@LABRCNTIP'$ (procalcitonin:4,lacticidven:4)  ) Recent Results (from the past 240 hour(s))  SARS Coronavirus 2 by RT PCR (hospital order, performed in [facility] hospital lab) *cepheid single result test* Anterior Nasal Swab     Status: None   Collection Time: 03/26/22  9:18 AM   Specimen: Anterior Nasal Swab  Result Value Ref Range Status   SARS Coronavirus 2 by RT PCR NEGATIVE NEGATIVE Final    Comment: (NOTE) SARS-CoV-2 target nucleic acids are NOT DETECTED.  The SARS-CoV-2 RNA is generally detectable in upper and lower respiratory specimens during the acute phase of infection. The lowest concentration of SARS-CoV-2 viral copies this assay can detect is 250 copies / mL. A negative result does not preclude SARS-CoV-2 infection and should not be  used as the sole basis for treatment or other patient management decisions.  A negative result may occur with improper specimen collection / handling, submission of specimen other than nasopharyngeal swab, presence of viral mutation(s) within the areas targeted by this assay, and inadequate number of viral copies (<250 copies / mL). A negative result must be combined with clinical observations, patient history, and epidemiological information.  Fact Sheet for Patients:   https://www.patel.info/  Fact Sheet for Healthcare Providers: https://hall.com/  This test is not yet approved or  cleared by the [place] FDA and has been authorized for detection and/or diagnosis of SARS-CoV-2 by FDA under an Emergency Use Authorization (EUA).  This EUA will remain in effect (meaning this test can be used) for the duration of the COVID-19 declaration under Section 564(b)(1) of the Act, 21 U.S.C. section 360bbb-3(b)(1), unless the authorization is terminated or revoked sooner.  Performed at [facility], [place] [address], [place], [place] [address]   Culture, blood (Routine X 2) w Reflex to ID Panel     Status: None (Preliminary result)   Collection Time: 03/26/22  9:50 AM   Specimen: BLOOD  Result Value Ref Range Status   Specimen Description BLOOD BLOOD RIGHT ARM  Final   Special Requests   Final    BOTTLES DRAWN AEROBIC AND ANAEROBIC Blood Culture adequate volume   Culture   Final    NO GROWTH 2 DAYS Performed at [facility], [place] [address] [place], [place] [address]    Report Status PENDING  Incomplete  Culture, blood (Routine X 2) w Reflex to ID Panel     Status: None (Preliminary result)   Collection Time: 03/26/22 10:04 AM   Specimen: BLOOD RIGHT HAND  Result Value Ref Range Status   Specimen Description BLOOD RIGHT HAND  Final   Special Requests   Final    BOTTLES DRAWN AEROBIC AND ANAEROBIC Blood Culture results may not be  optimal due to an excessive volume of blood received in culture bottles   Culture   Final    NO GROWTH 2 DAYS Performed at [facility], [place] [address] [place], [place] [address]    Report Status PENDING  Incomplete  Body fluid culture w Gram Stain     Status: None (Preliminary result)   Collection Time: 03/27/22 10:47 AM   Specimen: Synovium; Body Fluid  Result Value Ref Range Status   Specimen Description SYNOVIAL  Final   Special Requests R KNEE  Final   Gram Stain   Final    FEW WBC PRESENT,  PREDOMINANTLY PMN NO ORGANISMS SEEN    Culture   Final    NO GROWTH 2 DAYS Performed at [facility], [place] [address] [place], [place] [address]    Report Status PENDING  Incomplete     Radiology Studies: DG Knee Complete 4 Views Right  Result Date: 03/27/2022 CLINICAL DATA:  Pain and swelling without trauma EXAM: RIGHT KNEE - COMPLETE 4+ VIEW COMPARISON:  None Available. FINDINGS: Vascular calcifications. No acute fracture or dislocation. 3 compartment mild osteoarthritis. Small suprapatellar joint effusion. Enthesophyte at the quadriceps insertion. IMPRESSION: Mild osteoarthritis with small suprapatellar joint effusion. Electronically Signed   By: Abigail Miyamoto M.D.   On: 03/27/2022 12:27     Scheduled Meds:  allopurinol  50 mg Oral Daily   apixaban  5 mg Oral BID   aspirin  81 mg Oral Daily   hydrALAZINE  25 mg Oral TID   insulin aspart  0-9 Units Subcutaneous TID WC   polyethylene glycol  17 g Oral Daily   rosuvastatin  40 mg Oral Daily   senna-docusate  1 tablet Oral BID   sevelamer carbonate  800 mg Oral TID WC   sodium bicarbonate  650 mg Oral BID   Tafamidis  1 capsule Oral Daily   Continuous Infusions:   LOS: 6 days    Time spent: 6mn  PDomenic Polite MD [facility]   03/29/2022, 10:04 AM

## 2022-03-29 NOTE — Progress Notes (Addendum)
[facility] NEPHROLOGY PROGRESS NOTE  Assessment/ Plan: Pt is a 76 y.o. yo male  with HTN, HLD, DM, CVA, A-fib, prostate CA, amyloidosis, combined systolic and diastolic CHF (EF 16-07% w/ gr 2 diastolic dysfn), PEA arrest, chronic pericardial effusion status post pericardial window, CKD stage IV followed by Dr. Royce Macadamia at [facility] p/w worsening shortness of breath and DOE. Recent baseline serum creatinine level seems to be around 3.3-3.9 on torsemide 20 mg 3 times a week at home.  He underwent left brachial artery to cephalic vein AV fistula creation by Dr. Donzetta Matters on 01/31/2022. In the ED, Cr 4.58, CO2 19, BNP 1571, troponin 856, hemoglobin 11.6.  The chest x-ray consistent with diffuse bilateral pulmonary edema.  # Acute on chronic CKD stage IV with fluid overload w/ CKD thought to be due to diabetes and hypertension with recent baseline serum creatinine level around 3.3-3.9 followed by Dr. Harrie Jeans.   Would like to try and avoid starting dialysis in the hospital unless absolutely necessary and the pt agrees.  No absolute indications and he is not uremic and nonoliguric.  [place] 11/10 showed low filling pressures. Would continue holding diuresis. Urine output is good and creatinine level remains stable.  He does not have uremic symptoms or any other indication to start HD at this time.  He is walking in the hallway and feels good.  I think he can wait until AV fistula matures to start dialysis.  He will need close follow-up at [facility].  I have discussed with the patient and his wife. The [place] office will call the patient with appt in 7-10 days with either Dr. Royce Macadamia or PA.  # Vascular dialysis access: Left upper extremity AV fistula does not look matured therefore he may need HD catheter if dialysis needed.    # Acute on chronic combined CHF: Echo with EF of 30 to 35%, severe LVH. Initially diuresing with IV Lasix and metolazone but onhold with worsening renal function and also low filling pressures  on RHC.  Seen by cardiologist.  He can have torsemide 20 mg as needed for peripheral edema as outpatient.   # Metabolic acidosis: Continue oral sodium bicarbonate.   # Anemia of CKD: Hemoglobin 9.9, Iron sat 19 %.  I will order IV iron and Aranesp.  Monitor hemoglobin.   # CKD-MBD: Calcium acceptable, started Renvela for hyperphosphatemia with improvement of phosphorus level. Monitor lab.  #Right knee pain: Seen by Ortho status post arthrocentesis on 11/13 not suggestive of septic arthritis, received intra-articular steroid injection.  Clinically much better today.    Plan as outlined above.  I will sign off, please call us back with question.  I have discussed with the primary team.  Subjective: Seen and examined at bedside.  He was walking with therapy in the hallway.  Patient denies nausea, vomiting, dysgeusia, chest pain or shortness of breath.  Urine output is around 1.7 L. Objective Vital signs in last 24 hours: Vitals:   03/28/22 2030 03/29/22 0447 03/29/22 0728 03/29/22 0749  BP: 120/70 116/65 118/69 118/69  Pulse: (!) 40 (!) 43 (!) 41 (!) 41  Resp: '20  20 18  '$ Temp: 97.8 F (36.6 C) 97.6 F (36.4 C) 97.8 F (36.6 C) 97.8 F (36.6 C)  TempSrc: Oral Oral Oral   SpO2: 97% 100% 100%   Weight:  72.3 kg    Height:       Weight change: -0.9 kg  Intake/Output Summary (Last 24 hours) at 03/29/2022 1026 Last data  filed at 03/29/2022 0917 Gross per 24 hour  Intake 1925.98 ml  Output 1600 ml  Net 325.98 ml        Labs: RENAL PANEL Recent Labs    09/29/21 1601 10/11/21 1031 01/05/22 1125 01/31/22 0750 03/22/22 1659 03/24/22 0032 03/24/22 1428 03/25/22 0035 03/26/22 0024 03/27/22 0038 03/28/22 0035 03/29/22 0050  NA 143 142 140 141 143 140 141  142 140 139 139 136 138  K 4.0 4.3 4.3 3.8 3.8 3.7 3.6  3.6 3.9 3.4* 3.8 4.0 3.5  CL 108* 106 107 112* 113* 106  --  108 105 104 100 103  CO2 19* 22 23  --  19* 21*  --  21* '23 23 23 23  '$ GLUCOSE 89 103* 95 89 78  113*  --  133* 96 102* 260* 214*  BUN 66* 68* 56* 52* 70* 76*  --  78* 80* 85* 91* 92*  CREATININE 3.41* 3.35* 3.75* 3.10* 4.58* 4.93*  --  5.22* 5.26* 5.34* 5.46* 5.31*  CALCIUM 9.8 9.8 9.8  --  9.6 9.3  9.4  --  9.5 9.3 9.2 9.3 9.0  PHOS  --   --   --   --   --  6.7*  --   --   --   --  5.5* 5.5*  ALBUMIN  --   --  3.8  --   --   --   --   --   --   --  2.6* 2.6*      Liver Function Tests: Recent Labs  Lab 03/28/22 0035 03/29/22 0050  ALBUMIN 2.6* 2.6*    No results for input(s): "LIPASE", "AMYLASE" in the last 168 hours. No results for input(s): "AMMONIA" in the last 168 hours. CBC: Recent Labs    03/23/22 1615 03/24/22 0032 03/24/22 1428 03/25/22 0035 03/27/22 0038 03/28/22 0035  HGB  --  11.2* 11.6*  11.9* 11.5* 9.9* 9.9*  MCV  --  87.7  --  85.8 85.3 86.1  VITAMINB12 848  --   --   --   --   --   FOLATE 14.1  --   --   --   --   --   FERRITIN 95  --   --   --   --   --   TIBC 272  --   --   --   --   --   IRON 52  --   --   --   --   --   RETICCTPCT 0.7  --   --   --   --   --      Cardiac Enzymes: No results for input(s): "CKTOTAL", "CKMB", "CKMBINDEX", "TROPONINI" in the last 168 hours. CBG: Recent Labs  Lab 03/28/22 0616 03/28/22 1110 03/28/22 1607 03/28/22 2128 03/29/22 0608  GLUCAP 218* 153* 128* 136* 217*     Iron Studies: No results for input(s): "IRON", "TIBC", "TRANSFERRIN", "FERRITIN" in the last 72 hours. Studies/Results: DG Knee Complete 4 Views Right  Result Date: 03/27/2022 CLINICAL DATA:  Pain and swelling without trauma EXAM: RIGHT KNEE - COMPLETE 4+ VIEW COMPARISON:  None Available. FINDINGS: Vascular calcifications. No acute fracture or dislocation. 3 compartment mild osteoarthritis. Small suprapatellar joint effusion. Enthesophyte at the quadriceps insertion. IMPRESSION: Mild osteoarthritis with small suprapatellar joint effusion. Electronically Signed   By: Abigail Miyamoto M.D.   On: 03/27/2022 12:27     Medications: Infusions:    Scheduled Medications:  allopurinol  50  mg Oral Daily   apixaban  5 mg Oral BID   aspirin  81 mg Oral Daily   hydrALAZINE  25 mg Oral TID   insulin aspart  0-9 Units Subcutaneous TID WC   polyethylene glycol  17 g Oral Daily   [START ON 03/30/2022] rosuvastatin  5 mg Oral Daily   senna-docusate  1 tablet Oral BID   sevelamer carbonate  800 mg Oral TID WC   sodium bicarbonate  650 mg Oral BID   Tafamidis  1 capsule Oral Daily    have reviewed scheduled and prn medications.  Physical Exam: General:NAD, comfortable Heart:RRR, s1s2 nl Lungs:clear b/l, no crackle Abdomen:soft, Non-tender, non-distended Extremities: No peripheral edema. Dialysis Access: Left upper extremity AV fistula has good thrill and bruit.  Jaella Weinert Prasad Gustavus Haskin 03/29/2022,10:26 AM  LOS: 6 days

## 2022-03-29 NOTE — Consult Note (Signed)
   [facility] Inpatient Consult   03/29/2022  Arabi 1945-11-12 [id number]  [place] [employer] [[facility]] Patient: [employer]   Primary Care Provider:  [name], MD, Newborn at [facility] which is listed for the transition of care  Patient screened for hospitalization with noted high risk score for unplanned readmission risk l to assess for potential restart of [place] Management service needs for post hospital transition for care coordination.  Review of patient's electronic medical record reveals patient is for home today, noted blood sugar issues.  Met patient and spouse at the bedside, patient up in bedside chair, states feeling 'better'.  Explained reason tor visit and endorses PCP   Plan:  Currently, PCP is listed for transition of care for follow up and no other needs expressed.  Of note, [facility] Care Management/Population Health does not replace or interfere with any arrangements made by the Inpatient Transition of Care team.  For questions contact:   Natividad Brood, RN BSN [place]  [phone] business mobile phone Toll free office [phone]  *[place]  [phone] Fax number: [phone] [email or web address]

## 2022-03-29 NOTE — Evaluation (Signed)
Physical Therapy Evaluation Patient Details Name: Corey [name] MRN: [id number] DOB: 25-Sep-1945 Today's Date: 03/29/2022  History of Present Illness  Pt is a 76 y.o. male who presented 03/22/22 from cardiology appointment with worsening dyspnea. Pt admitted with acute on chronic combined systolic and diastolic heart failure. S/p right heart cath 11/10, R knee aspiration and injection 11/13.  PMH includes CHF, CVA, DOE, HTN, Gout, MI, OSA, PNA, seizures, DM, prostate cancer   Clinical Impression  Pt presents with condition above and deficits mentioned below, see PT Problem List. PTA, he was independent without DME, living with his wife in a 1-level house with 6-8 STE. Currently, pt is demonstrating deficits in overall strength, power, balance, endurance, and bil ankle dorsiflexion ROM that impact his safety and independence with functional mobility. Pt's limitations in bil ankle dorsiflexion ROM impact his ability to transfer to stand and ambulate without a posterior lean, needing cuing and up to modA initially to prevent LOB with transfers and gait bouts within the room using a RW. However, pt was able to progress to being able to transfer to stand and ambulate up to ~45 ft with a RW at a min guard assist level by the end of this session. Considering pt's quick progress within one session and pt reporting his wife is healthy and can assist him as needed upon d/c, recommending follow-up with HHPT provided family can provide the level of care pt needs and he continues to make good progress. Will continue to follow acutely.     Recommendations for follow up therapy are one component of a multi-disciplinary discharge planning process, led by the attending physician.  Recommendations may be updated based on patient status, additional functional criteria and insurance authorization.  Follow Up Recommendations Home health PT (provided family can provide assistance needed)      Assistance Recommended at  Discharge Intermittent Supervision/Assistance  Patient can return home with the following  A lot of help with walking and/or transfers;A little help with bathing/dressing/bathroom;Assistance with cooking/housework;Help with stairs or ramp for entrance;Assist for transportation    Equipment Recommendations BSC/3in1  Recommendations for Other Services  OT consult    Functional Status Assessment Patient has had a recent decline in their functional status and demonstrates the ability to make significant improvements in function in a reasonable and predictable amount of time.     Precautions / Restrictions Precautions Precautions: Fall Precaution Comments: bradycardia Restrictions Weight Bearing Restrictions: No      Mobility  Bed Mobility Overal bed mobility: Needs Assistance Bed Mobility: Supine to Sit     Supine to sit: Supervision, HOB elevated     General bed mobility comments: Supervision for safety, HOB elevated    Transfers Overall transfer level: Needs assistance Equipment used: Rolling walker (2 wheels) Transfers: Sit to/from Stand Sit to Stand: Min guard, Min assist, Mod assist           General transfer comment: Pt with posterior lean, bracing self with legs against surface when coming to stand. MinA the first x2 reps, x1 from EOB and x1 from recliner with arm rests. ModA the 3rd rep from chair without UE support and pt pulling up on RW. Min guard the 4th rep from recliner, using arm rests.    Ambulation/Gait Ambulation/Gait assistance: Min assist, Mod assist, Min guard Gait Distance (Feet): 45 Feet (x4 bouts of ~4 ft > ~12 ft > ~45 ft > ~6 ft) Assistive device: Rolling walker (2 wheels) Gait Pattern/deviations: Step-through pattern, Decreased stride length, Decreased  dorsiflexion - left, Decreased dorsiflexion - right, Trunk flexed, Leaning posteriorly Gait velocity: reduced Gait velocity interpretation: <1.31 ft/sec, indicative of household ambulator    General Gait Details: Pt with posterior bias initially, taking steps backwards after just taking a few forward, needing up to min-modA to prevent LOB. by the 3rd rep, pt displayed improved upright balance and was able to ambulate at a min guard assist level with a RW within the room. Pt needs cues to improve upright posture, but maintains a flexed posture. Limitations noted with bil ankle dorsiflexion ROM.  Stairs            Wheelchair Mobility    Modified Rankin (Stroke Patients Only)       Balance Overall balance assessment: Needs assistance Sitting-balance support: No upper extremity supported, Feet supported Sitting balance-Leahy Scale: Good   Postural control: Posterior lean Standing balance support: Bilateral upper extremity supported, During functional activity, Reliant on assistive device for balance Standing balance-Leahy Scale: Poor Standing balance comment: Posterior lean, needing up to modA with use of RW to prevent LOB                             Pertinent Vitals/Pain Pain Assessment Pain Assessment: Faces Faces Pain Scale: Hurts a little bit Pain Location: generalized stiffness Pain Descriptors / Indicators:  ("stiff") Pain Intervention(s): Limited activity within patient's tolerance, Monitored during session, Repositioned    Home Living Family/patient expects to be discharged to:: Private residence Living Arrangements: Spouse/significant other Available Help at Discharge: Family;Available 24 hours/day Type of Home: House Home Access: Stairs to enter Entrance Stairs-Rails: Right (ascending) Entrance Stairs-Number of Steps: 6-8   Home Layout: One level Home Equipment: Conservation officer, nature (2 wheels)      Prior Function Prior Level of Function : Independent/Modified Independent             Mobility Comments: No AD. No recent falls. ADLs Comments: Wife does the driving.     Hand Dominance        Extremity/Trunk Assessment   Upper  Extremity Assessment Upper Extremity Assessment: Defer to OT evaluation    Lower Extremity Assessment Lower Extremity Assessment: RLE deficits/detail;LLE deficits/detail RLE Deficits / Details: MMT scores of 4 to 4+ grossly; noted ankle dorsiflexion PROM limitation, about -10 degrees dorsiflexion; denies numbness/tingling bil; bandaging around lower leg LLE Deficits / Details: MMT scores of 4 to 4+ grossly; noted ankle dorsiflexion PROM limitation, about -10 degrees dorsiflexion; denies numbness/tingling bil    Cervical / Trunk Assessment Cervical / Trunk Assessment: Normal  Communication   Communication: No difficulties  Cognition Arousal/Alertness: Awake/alert Behavior During Therapy: WFL for tasks assessed/performed Overall Cognitive Status: Within Functional Limits for tasks assessed                                          General Comments General comments (skin integrity, edema, etc.): HR 40-50s at rest; encouraged OOB mobility with mob techs and nursing staff; educated pt on ankle dorsiflexion stretches    Exercises     Assessment/Plan    PT Assessment Patient needs continued PT services  PT Problem List Decreased strength;Decreased activity tolerance;Decreased range of motion;Decreased balance;Decreased mobility;Cardiopulmonary status limiting activity       PT Treatment Interventions DME instruction;Gait training;Stair training;Functional mobility training;Therapeutic activities;Therapeutic exercise;Balance training;Neuromuscular re-education;Patient/family education    PT Goals (Current goals can  be found in the Care Plan section)  Acute Rehab PT Goals Patient Stated Goal: to get better PT Goal Formulation: With patient Time For Goal Achievement: 04/12/22 Potential to Achieve Goals: Good    Frequency Min 3X/week     Co-evaluation               AM-PAC PT "6 Clicks" Mobility  Outcome Measure Help needed turning from your back to your  side while in a flat bed without using bedrails?: A Little Help needed moving from lying on your back to sitting on the side of a flat bed without using bedrails?: A Little Help needed moving to and from a bed to a chair (including a wheelchair)?: A Lot Help needed standing up from a chair using your arms (e.g., wheelchair or bedside chair)?: A Lot Help needed to walk in hospital room?: A Lot Help needed climbing 3-5 steps with a railing? : A Lot 6 Click Score: 14    End of Session Equipment Utilized During Treatment: Gait belt Activity Tolerance: Patient tolerated treatment well Patient left: in chair;with call bell/phone within reach Nurse Communication: Mobility status;Other (comment) (NT; pt in chair requested by NT for NT to complete bath with pt after session) PT Visit Diagnosis: Unsteadiness on feet (R26.81);Other abnormalities of gait and mobility (R26.89);Muscle weakness (generalized) (M62.81);Difficulty in walking, not elsewhere classified (R26.2)    Time: 6433-2951 PT Time Calculation (min) (ACUTE ONLY): 23 min   Charges:   PT Evaluation $PT Eval Moderate Complexity: 1 Mod PT Treatments $Therapeutic Activity: 8-22 mins        Moishe Spice, PT, DPT Acute Rehabilitation Services  Office: [phone]   Orvan Falconer 03/29/2022, 8:55 AM

## 2022-03-29 NOTE — Progress Notes (Signed)
   03/29/22 1023  Mobility  Activity Ambulated with assistance in hallway  Level of Assistance Contact guard assist, steadying assist  Assistive Device Front wheel walker  Distance Ambulated (ft) 80 ft  Activity Response Tolerated well  Mobility Referral Yes  $Mobility charge 1 Mobility   Mobility Specialist Progress Note  Pt was in bed and agreeable. Had no c/o pain throughout ambulation. Returned to chair w/ all needs met and call bell in reach.  Lucious Groves Mobility Specialist

## 2022-03-29 NOTE — TOC Transition Note (Addendum)
Transition of Care [facility]) - CM/SW Discharge Note   Patient Details  Name: Corey [name] MRN: [id number] Date of Birth: 1945/06/02  Transition of Care [facility]) CM/SW Contact:  Zenon Mayo, RN Phone Number: 03/29/2022, 1:08 PM   Clinical Narrative:    Patient is for dc today, NCM offered choice, for [facility], HHPT, wife states they had [facility] before and woulld like to continue with them.  NCM made referral to [facility] with [facility]. He is able to take referral soc will begin 24 to 48 hrs post dc. Wife will transport him home.  Wife state he has a BSC. DC on hold , blood sugars dropped.      Barriers to Discharge: Continued Medical Work up   Patient Goals and CMS Choice   CMS Medicare.gov Compare Post Acute Care list provided to:: Patient Represenative (must comment) [name] -wife) Choice offered to / list presented to : Spouse  Discharge Placement                       Discharge Plan and Services   Discharge Planning Services: CM Consult Post Acute Care Choice: [place]: [place]        Social Determinants of Health (SDOH) Interventions     Readmission Risk Interventions    09/30/2020    2:29 PM  Readmission Risk Prevention Plan  Transportation Screening Complete  PCP or Specialist Appt within 5-7 Days Complete  Home Care Screening Complete  Medication Review (RN CM) Complete

## 2022-03-30 ENCOUNTER — Telehealth: Payer: Self-pay

## 2022-03-30 LAB — BODY FLUID CULTURE W GRAM STAIN: Culture: NO GROWTH

## 2022-03-30 NOTE — Telephone Encounter (Signed)
Transition Care Management Follow-up Telephone Call Date of discharge and from where: [place] 03-29-22 Dx: acute on chronic combined systolic and diastolic heart failure  How have you been since you were released from the hospital? Weak  Any questions or concerns? No  Items Reviewed: Did the pt receive and understand the discharge instructions provided? Yes  Medications obtained and verified? Yes  Other? No  Any new allergies since your discharge? No  Dietary orders reviewed? Yes Do you have support at home? Yes   Home Care and Equipment/Supplies: Were home health services ordered? yes If so, what is the name of the agency? [facility]   Has the agency set up a time to come to the patient's home? no Were any new equipment or medical supplies ordered?  No What is the name of the medical supply agency? [facility] Were you able to get the supplies/equipment? not applicable Do you have any questions related to the use of the equipment or supplies? No  Functional Questionnaire: (I = Independent and D = Dependent) ADLs: I- WITH ASSISTANCE   Bathing/Dressing- I- WITH ASSISTANCE   Meal Prep- D  Eating- I  Maintaining continence- I  Transferring/Ambulation- I-WALKER  Managing Meds- D-WIFE MANAGES   Follow up appointments reviewed:  PCP Hospital f/u appt confirmed? Yes  Scheduled to see [name]  on 04-10-22 @ [facility] f/u appt confirmed? Yes- Dr Daniel Nones on 04-05-22 at 940am and wife is aware to make fu appt with nephrologist . Are transportation arrangements needed? No  If their condition worsens, is the pt aware to call PCP or go to the Emergency Dept.? Yes Was the patient provided with contact information for the PCP's office or ED? Yes Was to pt encouraged to call back with questions or concerns? Yes   Juanda Crumble LPN [place] Direct Dial [phone]

## 2022-03-31 ENCOUNTER — Other Ambulatory Visit: Payer: Self-pay

## 2022-03-31 ENCOUNTER — Emergency Department (HOSPITAL_COMMUNITY)
Admission: EM | Admit: 2022-03-31 | Discharge: 2022-03-31 | Disposition: A | Discharge status: Home / Self Care | Payer: Medicare HMO | Attending: Emergency Medicine | Admitting: Emergency Medicine

## 2022-03-31 ENCOUNTER — Encounter (HOSPITAL_COMMUNITY): Payer: Self-pay

## 2022-03-31 DIAGNOSIS — R531 Weakness: Secondary | ICD-10-CM | POA: Diagnosis not present

## 2022-03-31 DIAGNOSIS — R040 Epistaxis: Secondary | ICD-10-CM | POA: Diagnosis not present

## 2022-03-31 DIAGNOSIS — Z7901 Long term (current) use of anticoagulants: Secondary | ICD-10-CM | POA: Insufficient documentation

## 2022-03-31 DIAGNOSIS — I10 Essential (primary) hypertension: Secondary | ICD-10-CM | POA: Diagnosis not present

## 2022-03-31 DIAGNOSIS — I2510 Atherosclerotic heart disease of native coronary artery without angina pectoris: Secondary | ICD-10-CM | POA: Diagnosis not present

## 2022-03-31 DIAGNOSIS — Z79899 Other long term (current) drug therapy: Secondary | ICD-10-CM | POA: Insufficient documentation

## 2022-03-31 DIAGNOSIS — Z8546 Personal history of malignant neoplasm of prostate: Secondary | ICD-10-CM | POA: Diagnosis not present

## 2022-03-31 DIAGNOSIS — E119 Type 2 diabetes mellitus without complications: Secondary | ICD-10-CM | POA: Insufficient documentation

## 2022-03-31 DIAGNOSIS — R58 Hemorrhage, not elsewhere classified: Secondary | ICD-10-CM | POA: Diagnosis not present

## 2022-03-31 LAB — CBC
HCT: 31.8 % — ABNORMAL LOW (ref 39.0–52.0)
Hemoglobin: 10.1 g/dL — ABNORMAL LOW (ref 13.0–17.0)
MCH: 27.7 pg (ref 26.0–34.0)
MCHC: 31.8 g/dL (ref 30.0–36.0)
MCV: 87.4 fL (ref 80.0–100.0)
Platelets: 122 10*3/uL — ABNORMAL LOW (ref 150–400)
RBC: 3.64 MIL/uL — ABNORMAL LOW (ref 4.22–5.81)
RDW: 15.8 % — ABNORMAL HIGH (ref 11.5–15.5)
WBC: 3.6 10*3/uL — ABNORMAL LOW (ref 4.0–10.5)
nRBC: 0 % (ref 0.0–0.2)

## 2022-03-31 LAB — CULTURE, BLOOD (ROUTINE X 2)
Culture: NO GROWTH
Culture: NO GROWTH
Special Requests: ADEQUATE

## 2022-03-31 MED ORDER — OXYMETAZOLINE HCL 0.05 % NA SOLN: 1.0000 | Freq: Two times a day (BID) | NASAL | 0 refills | Status: DC

## 2022-03-31 NOTE — ED Triage Notes (Signed)
Pt BIB [facility] from home c/o a nose bleed that has been ongoing x 1.5 hours. Pt is on Eliquis. PT blew out a big clot and [facility] sprayed afrin in his nose and the bleeding slowed down but still is bleeding some.

## 2022-03-31 NOTE — ED Notes (Signed)
Patient observed to no longer have a nose bleed, family at bedside reports that it has stopped bleeding as well.

## 2022-03-31 NOTE — ED Provider Notes (Signed)
[facility] EMERGENCY DEPARTMENT Provider Note   CSN: [id number] Arrival date & time: 03/31/22  0501     History PMH: HTN, HFpEF, OSA, CVA, DM, hx prostate cancer, hx of CAD/MI, atrial fibrillation on Eliquis No chief complaint on file.   Corey [name] is a 76 y.o. male.  Presenting with epistaxis.  Apparently nosebleed started around 3:30 AM.  Patient was apparently picking his nose when it started.  EMS was called due to a lot of blood loss not being able to control the bleed at home with pressure.  He was given Afrin which did slow down the bleeding.  Apparently a large blood clot came out when he blew his nose with them. Apparently bleeding has slowed down a lot since being in the waiting room.  Patient's family member states that she has not noticed any dripping blood for about 15 to 20 minutes at this time.    HPI     Home Medications Prior to Admission medications   Medication Sig Start Date End Date Taking? Authorizing Provider  oxymetazoline (AFRIN NASAL SPRAY) 0.05 % nasal spray Place 1 spray into both nostrils 2 (two) times daily. 03/31/22  Yes Randeep Biondolillo, Adora Fridge, PA-C  ACCU-CHEK AVIVA PLUS test strip TEST BLOOD SUGAR TWICE DAILY AS DIRECTED 12/05/20   Elayne Snare, MD  ACCU-CHEK AVIVA PLUS test strip Use as instructed 12/04/20   Elayne Snare, MD  albuterol (VENTOLIN HFA) 108 (90 Base) MCG/ACT inhaler Inhale 2 puffs into the lungs every 4 (four) hours as needed for wheezing or shortness of breath. 11/14/21   White, Leitha Schuller, NP  allopurinol (ZYLOPRIM) 100 MG tablet TAKE 1/2 TABLET ONE TIME DAILY Patient taking differently: Take 100 mg by mouth daily as needed (for gout flare up). 11/01/21   [name], MD  apixaban (ELIQUIS) 5 MG TABS tablet Take 1 tablet (5 mg total) by mouth 2 (two) times daily. 01/12/22   Jerline Pain, MD  bismuth subsalicylate (PEPTO BISMOL) 262 MG/15ML suspension Take 30 mLs by mouth every 6 (six) hours as needed for indigestion.     [provider]  colchicine (COLCRYS) 0.6 MG tablet Take 2 tabs once and then one tab one hour later as needed for gout 07/30/20   [name], MD  ergocalciferol (VITAMIN D2) 1.25 MG (50000 UT) capsule Take 50,000 Units by mouth once a week. Monday    [provider]  hydrALAZINE (APRESOLINE) 25 MG tablet Take 1 tablet (25 mg total) by mouth 3 (three) times daily. 03/29/22   Domenic Polite, MD  rosuvastatin (CRESTOR) 5 MG tablet Take 1 tablet (5 mg total) by mouth daily. 03/30/22   Domenic Polite, MD  sodium bicarbonate 650 MG tablet Take 1 tablet (650 mg total) by mouth 2 (two) times daily. 03/29/22   Domenic Polite, MD  Tafamidis [facility]) 61 MG CAPS Take 61 mg by mouth daily. 04/28/21   Geralynn Rile, MD  torsemide (DEMADEX) 20 MG tablet Take 1 tablet (20 mg total) by mouth daily as needed (for 2 pound weight gain or swelling). Patient take 20 mg tablet by mouth every Monday, Wednesday, and Friday May take 1 additional tablet for swelling in legs 03/29/22   Domenic Polite, MD  TRUEplus Lancets 33G MISC USE 4 (FOUR) TIMES DAILY 04/04/21   Elayne Snare, MD      Allergies    Hydrochlorothiazide    Review of Systems   Review of Systems  HENT:  Positive for nosebleeds.  All other systems reviewed and are negative.   Physical Exam Updated Vital Signs BP 139/83 (BP Location: Right Arm)   Pulse (!) 48   Temp 98 F (36.7 C) (Oral)   Resp 16   SpO2 99%  Physical Exam Vitals and nursing note reviewed.  Constitutional:      General: He is not in acute distress.    Appearance: Normal appearance. He is well-developed. He is not ill-appearing, toxic-appearing or diaphoretic.  HENT:     Head: Normocephalic and atraumatic.     Nose: No nasal deformity.     Right Nostril: No epistaxis or septal hematoma.     Left Nostril: No epistaxis or septal hematoma.     Comments: No active bleeding noted. Dried blood in left nare.     Mouth/Throat:     Lips: Pink. No  lesions.  Eyes:     General: Gaze aligned appropriately. No scleral icterus.       Right eye: No discharge.        Left eye: No discharge.     Conjunctiva/sclera: Conjunctivae normal.     Right eye: Right conjunctiva is not injected. No exudate or hemorrhage.    Left eye: Left conjunctiva is not injected. No exudate or hemorrhage. Pulmonary:     Effort: Pulmonary effort is normal. No respiratory distress.  Skin:    General: Skin is warm and dry.  Neurological:     Mental Status: He is alert and oriented to person, place, and time.  Psychiatric:        Mood and Affect: Mood normal.        Speech: Speech normal.        Behavior: Behavior normal. Behavior is cooperative.     ED Results / Procedures / Treatments   Labs (all labs ordered are listed, but only abnormal results are displayed) Labs Reviewed  CBC - Abnormal; Notable for the following components:      Result Value   WBC 3.6 (*)    RBC 3.64 (*)    Hemoglobin 10.1 (*)    HCT 31.8 (*)    RDW 15.8 (*)    Platelets 122 (*)    All other components within normal limits    EKG None  Radiology No results found.  Procedures Procedures   Medications Ordered in ED Medications - No data to display  ED Course/ Medical Decision Making/ A&P                           Medical Decision Making Risk OTC drugs.   Patient here with epistaxis while on blood thinners.  There was known nasal trauma prior to the bleeding starting.  He received Afrin several hours ago.  Bleeding was still present as of 20 to 30 minutes ago.  He has no evidence of any active bleeding at this time.  We will continue to observe for about 30 more minutes to assess for recurrence.  Observed for over 1 hour without any recurrence of epistasis.  I discussed home interventions if this recurs.  I prescribed Afrin to use if pressure does not work. Return precautions provided.    Final Clinical Impression(s) / ED Diagnoses Final diagnoses:  Epistaxis  due to trauma    Rx / DC Orders ED Discharge Orders          Ordered    oxymetazoline (AFRIN NASAL SPRAY) 0.05 % nasal spray  2 times daily  03/31/22 1208              Adolphus Birchwood, PA-C 03/31/22 1421    Tegeler, Gwenyth Allegra, MD 03/31/22 1515

## 2022-03-31 NOTE — ED Notes (Signed)
Currently pt's nose is not bleeding.

## 2022-03-31 NOTE — ED Provider Triage Note (Signed)
Emergency Medicine Provider Triage Evaluation Note  Corey [name] , a 76 y.o. male  was evaluated in triage.  Pt complains of epistaxis. States that same began around 2 hours ago when he scratched the inside of his left nare. States that since then he has been unable to get his bleeding to stop. EMS applied Afrin without success. Patient is on Elliquis. Patient was just discharged home after admission for CHF. Denies any other symptoms.   Review of Systems  Positive:  Negative:   Physical Exam  BP 131/79 (BP Location: Right Arm)   Pulse 82   Temp 97.6 F (36.4 C)   Resp 16   SpO2 97%  Gen:   Awake, no distress   Resp:  Normal effort  MSK:   Moves extremities without difficulty  Other:  Active bleeding from left nare. Direct pressure applied  Medical Decision Making  Medically screening exam initiated at 5:12 AM.  Appropriate orders placed.  Corey [name] was informed that the remainder of the evaluation will be completed by another provider, this initial triage assessment does not replace that evaluation, and the importance of remaining in the ED until their evaluation is complete.  Patients last hgb check 9.9, will recheck.   Bud Face, PA-C 03/31/22 [phone]

## 2022-03-31 NOTE — Discharge Instructions (Addendum)
You have been seen in the emergency department today for a nose bleed. The bleeding has stopped with treatment of nasal spray. If bleeding returns, please hold direct pressure. If this does not work, I have prescribed Afrin spray to use only as needed. If bleeding continues after this, please return to the ED.

## 2022-04-03 ENCOUNTER — Telehealth: Payer: Self-pay | Admitting: Internal Medicine

## 2022-04-03 ENCOUNTER — Telehealth: Payer: Self-pay | Admitting: Cardiology

## 2022-04-03 NOTE — Telephone Encounter (Signed)
Physical Therapist Consuello Bossier called wanting to give and update on his evaluation with the patient. He stated the patient is having back pain rating a 7 out of 10. Patient wants to know what medications he can take for pain. Physical Therapist wants to know has there been any imaging for his back pain to see what's causing pain. He wants to know is the patient dx with any memory lost. Patient is having some fatigue.Patient fell last week but no injuries. Needs verbal orders for PT 1 time a week for 4 weeks. Best number to call Physical Therapist back is [phone]

## 2022-04-03 NOTE — Telephone Encounter (Signed)
New Message:    Patient was in the hospital for CHF. He said he  received  a referral. He went to patient's home today. Patient reports Fatigue with minimal exertion. His heart rate is in the lower 50's. When patient wasi n the hospital it was in the 40's sometimes. His question is, what is the heart rate that you want to be notified?

## 2022-04-03 NOTE — Telephone Encounter (Signed)
This call came from [place] with [facility] according to the message taker.   Pt has had a long history of bradycardia/At Fib.  Will have Dr Marlou Porch to review for parameters.

## 2022-04-04 NOTE — Telephone Encounter (Signed)
Verbals given and patient's wife informed to take Tylenol.

## 2022-04-04 NOTE — Telephone Encounter (Signed)
He can take tylenol for pain.  May need to be seen for evaluation if not improving.  I have not seen him for back pain in the past.    Yes has been diagnosed with mild cognitive impairment by neuro.    [place] for [employer]

## 2022-04-04 NOTE — Telephone Encounter (Signed)
Called and left message for Clair Gulling on confidential voicemail of Dr Marlou Porch orders to notify him if pt's HR is < 40 bpm.  Requested Clair Gulling call back if any further questions or concerns.

## 2022-04-05 ENCOUNTER — Encounter (HOSPITAL_COMMUNITY): Payer: Self-pay | Admitting: Cardiology

## 2022-04-05 ENCOUNTER — Ambulatory Visit (HOSPITAL_COMMUNITY)
Admission: RE | Admit: 2022-04-05 | Discharge: 2022-04-05 | Disposition: A | Discharge status: Home / Self Care | Payer: Medicare HMO | Source: Ambulatory Visit | Attending: Cardiology | Admitting: Cardiology

## 2022-04-05 VITALS — BP 120/70 | HR 49 | Wt 165.2 lb

## 2022-04-05 DIAGNOSIS — I4892 Unspecified atrial flutter: Secondary | ICD-10-CM | POA: Diagnosis not present

## 2022-04-05 DIAGNOSIS — I43 Cardiomyopathy in diseases classified elsewhere: Secondary | ICD-10-CM | POA: Diagnosis not present

## 2022-04-05 DIAGNOSIS — Z833 Family history of diabetes mellitus: Secondary | ICD-10-CM | POA: Insufficient documentation

## 2022-04-05 DIAGNOSIS — E854 Organ-limited amyloidosis: Secondary | ICD-10-CM | POA: Insufficient documentation

## 2022-04-05 DIAGNOSIS — I4891 Unspecified atrial fibrillation: Secondary | ICD-10-CM | POA: Insufficient documentation

## 2022-04-05 DIAGNOSIS — Z79899 Other long term (current) drug therapy: Secondary | ICD-10-CM | POA: Insufficient documentation

## 2022-04-05 DIAGNOSIS — I2510 Atherosclerotic heart disease of native coronary artery without angina pectoris: Secondary | ICD-10-CM | POA: Diagnosis not present

## 2022-04-05 DIAGNOSIS — Z8249 Family history of ischemic heart disease and other diseases of the circulatory system: Secondary | ICD-10-CM | POA: Diagnosis not present

## 2022-04-05 DIAGNOSIS — E1122 Type 2 diabetes mellitus with diabetic chronic kidney disease: Secondary | ICD-10-CM | POA: Insufficient documentation

## 2022-04-05 DIAGNOSIS — Z8674 Personal history of sudden cardiac arrest: Secondary | ICD-10-CM | POA: Diagnosis not present

## 2022-04-05 DIAGNOSIS — I5042 Chronic combined systolic (congestive) and diastolic (congestive) heart failure: Secondary | ICD-10-CM | POA: Insufficient documentation

## 2022-04-05 DIAGNOSIS — N184 Chronic kidney disease, stage 4 (severe): Secondary | ICD-10-CM | POA: Diagnosis not present

## 2022-04-05 DIAGNOSIS — Z8673 Personal history of transient ischemic attack (TIA), and cerebral infarction without residual deficits: Secondary | ICD-10-CM | POA: Diagnosis not present

## 2022-04-05 DIAGNOSIS — E785 Hyperlipidemia, unspecified: Secondary | ICD-10-CM | POA: Diagnosis not present

## 2022-04-05 DIAGNOSIS — N185 Chronic kidney disease, stage 5: Secondary | ICD-10-CM | POA: Diagnosis not present

## 2022-04-05 DIAGNOSIS — Z87891 Personal history of nicotine dependence: Secondary | ICD-10-CM | POA: Insufficient documentation

## 2022-04-05 DIAGNOSIS — I132 Hypertensive heart and chronic kidney disease with heart failure and with stage 5 chronic kidney disease, or end stage renal disease: Secondary | ICD-10-CM | POA: Diagnosis not present

## 2022-04-05 NOTE — Progress Notes (Signed)
ReDS Vest / Clip - 04/05/22 1100       ReDS Vest / Clip   Station Marker C    Ruler Value 28    ReDS Value Range High volume overload    ReDS Actual Value 42

## 2022-04-05 NOTE — Progress Notes (Signed)
ADVANCED HEART FAILURE CLINIC NOTE  Referring Physician: [name], MD  Primary Care: Corey Rail, MD Primary Cardiologist: Corey [name]  HPI: Corey [name] is a 76 year old African American male with cardiac amyloid (ATTR) on tafamidis, history of PEA arrest, pericardial effusion s/p window, hypertension, hyperlipidemia, CKD now progressing towards ESRD, type 2 diabetes, history of CVA and recently diagnosed atrial fibrillation/AFL presenting today to establish care after recent hospitalization.    In 2022, Corey [name] presented to [facility] with Charco. LHC at that time demonstrated 80% occlusion of the LAD, however, PCI was deferred due to markedly elevated LVEDP. LVEF by echo at that time of 35%-40%. Due to a large pericardial effusion, he had a window done with biopsies at that time compatible with amyloidosis. AL evaluation was negative. PYP was equivocal. Genetic testing consistent with Val142Ile TTR. Since that time he has followed closely with Corey [name]. According to Corey [name] see shortly after being diagnosed with atrial fibrillation he has noted continued decline in functional status and worsening lower extremity edema.  He was admitted in early November 2023 with volume overload and atrial flutter. He was diuresed, underwent TEE/DCCV x 2 with deteroriation to AFL shortly after. In addition, after diuresis underwent RHC that confirmed low filling pressures, compensated output.   Today he presents for post-hospital follow up. Reports improvement in dyspnea, however, still feels SOB after walking >25-21f. PND/orthopnea have improved drastically. No reported chest pain.   Activity level/exercise tolerance:  NYHA III Orthopnea:  Sleeps on 2-3 pillows Paroxysmal noctural dyspnea:  NO Chest pain/pressure:  NO Orthostatic lightheadedness:  NO Palpitations:  yes Lower extremity edema:  imrpoved Presyncope/syncope:  no Cough:  no  Past Medical History:  Diagnosis Date    Cataract    CHF (congestive heart failure) (HCC)    Chronic heart failure with preserved ejection fraction (HFpEF) [place]    Colon polyp 2003   Dr SLyla Son F/U was to be 2008( not completed)   Coronary artery disease    CVA (cerebral infarction) 2014   Diabetes mellitus    Diverticulosis 2003   Dyspnea    with exertion   Gout    Hereditary cardiac amyloidosis (HCC)    Hypertension    [place]   PEA cardiac arrest in setting of acute respiratory failure/pulmonary edema   OSA (obstructive sleep apnea) 03/27/2018   Pneumonia 09/29/2011   Avelox X 10 days as OP   Prostate cancer ([facility]    Renal insufficiency    [place]   not treated for seizure disorder; had a seizure after stroke 2014; no seizure since then   Stroke ([facility]     Current Outpatient Medications  Medication Sig Dispense Refill   ACCU-CHEK AVIVA PLUS test strip TEST BLOOD SUGAR TWICE DAILY AS DIRECTED 200 strip 1   albuterol (VENTOLIN HFA) 108 (90 Base) MCG/ACT inhaler Inhale 2 puffs into the lungs every 4 (four) hours as needed for wheezing or shortness of breath. 18 g 1   allopurinol (ZYLOPRIM) 100 MG tablet Take 100 mg by mouth as needed.     apixaban (ELIQUIS) 5 MG TABS tablet Take 1 tablet (5 mg total) by mouth 2 (two) times daily. 60 tablet 6   bismuth subsalicylate (PEPTO BISMOL) 262 MG/15ML suspension Take 30 mLs by mouth every 6 (six) hours as needed for indigestion.     colchicine (COLCRYS) 0.6 MG tablet Take 2 tabs once and then one tab one hour later as needed  for gout 15 tablet 2   ergocalciferol (VITAMIN D2) 1.25 MG (50000 UT) capsule Take 50,000 Units by mouth once a week. Monday     hydrALAZINE (APRESOLINE) 25 MG tablet Take 1 tablet (25 mg total) by mouth 3 (three) times daily. 90 tablet 0   rosuvastatin (CRESTOR) 5 MG tablet Take 1 tablet (5 mg total) by mouth daily. 30 tablet 0   sodium bicarbonate 650 MG tablet Take 1 tablet (650 mg total) by mouth 2  (two) times daily. 60 tablet 0   Tafamidis (VYNDAMAX) 61 MG CAPS Take 61 mg by mouth daily. 30 capsule 12   torsemide (DEMADEX) 20 MG tablet Take 20 mg by mouth as needed.     TRUEplus Lancets 33G MISC USE 4 (FOUR) TIMES DAILY 400 each 2   No current facility-administered medications for this encounter.    Allergies  Allergen Reactions   Hydrochlorothiazide Other (See Comments)    Gout , uncontrolled diabetes and renal insufficiency      Social History   Socioeconomic History   Marital status: Married    Spouse name: Corey [name]   Number of children: 2   Years of education: 12   Highest education level: 12th grade  Occupational History   Not on file  Tobacco Use   Smoking status: Former    Packs/day: 0.25    Years: 1.00    Total pack years: 0.25    Types: Cigarettes    Quit date: 05/15/1968    Years since quitting: 53.9   Smokeless tobacco: Never   Tobacco comments:    smoked [place], up to 1-2 cigarettes/ day  Vaping Use   Vaping Use: Never used  Substance and Sexual Activity   Alcohol use: No    Alcohol/week: 0.0 standard drinks of alcohol   Drug use: No   Sexual activity: Yes  Other Topics Concern   Not on file  Social History Narrative   Walks three times a week, 2-3 miles, occasional walks on treadmill       Patient is right-handed. He lives with his wife in a one level home. He drinks 1 diet Mt. Dew a day and rarely has coffee. He walks 3 x a week for exercise.   Social Determinants of Health   Financial Resource Strain: Low Risk  (02/02/2022)   Overall Financial Resource Strain (CARDIA)    Difficulty of Paying Living Expenses: Not hard at all  Food Insecurity: No Food Insecurity (02/02/2022)   Hunger Vital Sign    Worried About Running Out of Food in the Last Year: Never true    Ran Out of Food in the Last Year: Never true  Transportation Needs: No Transportation Needs (02/02/2022)   PRAPARE - Hydrologist (Medical): No     Lack of Transportation (Non-Medical): No  Physical Activity: Inactive (02/02/2022)   Exercise Vital Sign    Days of Exercise per Week: 0 days    Minutes of Exercise per Session: 0 min  Stress: No Stress Concern Present (02/02/2022)   [place]    Feeling of Stress : Not at all  Social Connections: [place] (02/02/2022)   Social Connection and Isolation Panel [NHANES]    Frequency of Communication with Friends and Family: More than three times a week    Frequency of Social Gatherings with Friends and Family: More than three times a week    Attends Religious Services: More than 4  times per year    Active Member of Clubs or Organizations: Yes    Attends Archivist Meetings: More than 4 times per year    Marital Status: Married  Human resources officer Violence: Not At Risk (02/02/2022)   Humiliation, Afraid, Rape, and Kick questionnaire    Fear of Current or Ex-Partner: No    Emotionally Abused: No    Physically Abused: No    Sexually Abused: No      Family History  Problem Relation Age of Onset   Heart failure Mother    Hypertension Mother    Diabetes Mother    Hypertension Father    Prostate cancer Father    Diabetes Father    Cancer Father    Heart failure Brother    Stroke Neg Hx    Heart disease Neg Hx    Colon cancer Neg Hx     PHYSICAL EXAM: Vitals:   04/05/22 0949  BP: 120/70  Pulse: (!) 49  SpO2: 98%   GENERAL: Well nourished, well developed, and in no apparent distress at rest.  HEENT: Negative for arcus senilis or xanthelasma. There is no scleral icterus.  The mucous membranes are pink and moist.   NECK: Supple, No masses. Normal carotid upstrokes without bruits. No masses or thyromegaly.    CHEST: There are no chest wall deformities. There is no chest wall tenderness. Respirations are unlabored.  Lungs- CTA B/L CARDIAC:  JVP: 7-8 with large V waves cm H2O            bradycardic  with irregular rhythm. No murmurs, rubs or gallops.  Pulses are 2+ and symmetrical in upper and lower extremities. 1+ edema.  ABDOMEN: Soft, non-tender, non-distended. There are no masses or hepatomegaly. There are normal bowel sounds.  EXTREMITIES: Warm and well perfused with no cyanosis, clubbing.  LYMPHATIC: No axillary or supraclavicular lymphadenopathy.  NEUROLOGIC: Patient is oriented x3 with no focal or lateralizing neurologic deficits.  PSYCH: Patients affect is appropriate, there is no evidence of anxiety or depression.  SKIN: Warm and dry; no lesions or wounds.   DATA REVIEW  ECG:AFL w/ variable conduction, slow ventricular rate  ECHO: 13' echo: normal EF, severe LVH,G2DD, suggestive for cardiac amyloidosis  13' cMRI: less suggestive of amyloid and more consistent with non obstructive HOCM  4' echo: 55-60%, severe LVH, mod pericardial effusion 19' echo: EF 55-60%, G3DD, moderate pericardial effusion  5/22: ECHO with EF 35-40%, G2DD, RWMA, Large, pericardial effusion  5/22: Underwent pericardial window procedure for his pericardial effusion and biopsies were taken which were consistent with amyloidosis, suggestive of possible AL amyloidosis  8/22: PYP: Findings are equivocal for TTR amyloidosis    CATH: 5/22: L/RHC: single vessel CAD 80% LAD occlusion with severely elevated LVEDP    ASSESSMENT & PLAN:  Heart Failure with reduced ejection fraction secondary to amyloidosis  Etiology of HF:  Progression of underlying amyloid; does have a history of significant LAD stenosis by LHC in 5/22, however, at this time I do not believe that his CAD is the primary culprit behind is reduced LVEF. Will refer to EP for evaluation for ablation to help quality of life.  NYHA class / AHA Stage:III Volume status & Diuretics: Mildly hypervolemic, instructed on using torsemide for the next 2 days and then PRN Vasodilators:hydralazine '25mg'$  TID, approaching ESRD. Will hold off on ARNI/ACE/ARB at  this time Beta-Blocker:contraindicated at this time in the setting of restrictive cardiomyopathy (amyloid), slow ventricular rate in the setting of AFL.  MRA:N/A Cardiometabolic:GFR too low  Devices therapies & Valvulopathies: continue to monitor LVEF, may benefit from ICD in the future Advanced therapies:Not indicated.   2. ATTR amyloid  - Followed by Corey [name]  - Tafamadis  - Val142Ile TTR  3. CAD - 80% LAD lesion in 2022 - If he starts to show signs of angina will consider PCI; avoiding LHC at this time in the setting of progressively worsening renal failure. Will discuss prior cath with interventional colleagues.   4. CKD IV-V, approaching ESRD - followed closely by nephrology  - AVF in place    Corey [name] Advanced Heart Failure Mechanical Circulatory Support

## 2022-04-05 NOTE — Patient Instructions (Signed)
Good to see  you today!  Take Torsemide daily for 3 days then return to taking as needed  You have been referred to Electrophysiology for Aflutter Ablation, That office will call to schedule   Your physician recommends that you schedule a follow-up appointment in: 2 months  If you have any questions or concerns before your next appointment please send [place] a message through [place] or call our office at [phone].    TO LEAVE A MESSAGE FOR THE NURSE SELECT OPTION 2, PLEASE LEAVE A MESSAGE INCLUDING: YOUR NAME DATE OF BIRTH CALL BACK NUMBER REASON FOR CALL**this is important as we prioritize the call backs  YOU WILL RECEIVE A CALL BACK THE SAME DAY AS LONG AS YOU CALL BEFORE 4:00 PM  At the [facility], you and your health needs are our priority. As part of our continuing mission to provide you with exceptional heart care, we have created designated Provider Care Teams. These Care Teams include your primary Cardiologist (physician) and Advanced Practice Providers (APPs- Physician Assistants and Nurse Practitioners) who all work together to provide you with the care you need, when you need it.   You may see any of the following providers on your designated Care Team at your next follow up: Dr Glori Bickers Dr Loralie Champagne Dr. Roxana Hires, NP Lyda Jester, [place] [facility] [place], [place] Forestine Na, NP Audry Riles, PharmD   Please be sure to bring in all your medications bottles to every appointment.

## 2022-04-09 ENCOUNTER — Encounter: Payer: Self-pay | Admitting: Internal Medicine

## 2022-04-09 NOTE — Progress Notes (Signed)
Subjective:    Patient ID: Corey [name], male    DOB: 1945-08-03, 76 y.o.   MRN: [id number]     HPI Corey [name] is here for follow up from the hospital.  Corey [name] is here with Corey [name] wife.  Admitted 11/9 - 11/15 for acute on chronic combined systolic and diastolic HF  Recommendations for Outpatient Follow-up:  Nephrology Dr. Royce Macadamia in 1 to 2 weeks, [facility] to call patient with appointment Advanced heart failure team for cardiac amyloidosis in 1 to 2 months  Presented with worsening DOE, orthopnea and edema. BNP was 1571, CXR noted b/l interstitial edema, BUN 70, Cr 4.5, EKG noted aflutter   Acute on chronic combined systolic and diastolic HF: Known cardiac amyloidosis Diuresed well with IV lasix ( -9.3L) Echo ED 30-35%, severe LVH Heart failure team and nephrology saw pt RHC w low filling pressures, diurectics held Cr stable, hydralazine continued D/c'd on torsemide 20 mg daily prn for 2 lb weight gain or edema  Elevated troponin Due to demand ischemia, ACS not suspected  AKI on CKD, stage 4: Left UE AVF placed 9/19 Cr worse than baseline, diuresed initially, RHC low filling pressure - diuretics held Nephrology followed - no HD at this time Given IVF one day  Cr plateau at 5.3 Torsemide 20 mg prn weight gain or edema  Fever 11/--12, right knee pain, gout flare: Ortho evaluated, had arthrocentesis s/p steroid injection Gram stain neg, cx neg x 2 days Blood cultures neg Right lower leg w/ small wound w/o infection Resolved  Paroxysmal Afib: In atrial flutter Rate controlled Continue w/ eliquis  Right lower leg chronic wound: Managed at the wound center and w/ home health RN.  Changed 11/9 bt [place] team small dime sized w granulation tissue  Hereditary cardiac amyloiddosis On tafamidis Following w/ heart failure team  S/p pericardial window No effusion noted  Diabetes: A1c 5.7%  Htn: stable   Corey [name] breathing is better, but still bad.      Weight today at home 168 lb.  Last few days 165, 164.  Corey [name] has taken 1-2 doses of torsemide since being in the hospital.  Corey [name] legs are swollen here today-more swollen than they have been.   Right leg wound - it is healing.  Corey [name] goes this Thursday to the wound center.  Corey [name] has a visiting nurse who is monitoring and treating the wound.  Both him and Corey [name] wife state it is much better.  Back pain -  lower back pain.  It started about one month ago.  Corey [name] did fall out of the bed at one point - not sure if that caused it.  No other falls / injuries.  It is across the lower back- No radiation.  No pain in legs.  Tylenol helped a little-Corey [name] wife gave him 1 pill.  No N/T in legs.  No new leg weakness.    Has PT - [facility].  They have started coming.  Medications and allergies reviewed with patient and updated if appropriate.  Current Outpatient Medications on File Prior to Visit  Medication Sig Dispense Refill   ACCU-CHEK AVIVA PLUS test strip TEST BLOOD SUGAR TWICE DAILY AS DIRECTED 200 strip 1   albuterol (VENTOLIN HFA) 108 (90 Base) MCG/ACT inhaler Inhale 2 puffs into the lungs every 4 (four) hours as needed for wheezing or shortness of breath. 18 g 1   allopurinol (ZYLOPRIM) 100 MG tablet Take 100 mg by mouth as needed.  apixaban (ELIQUIS) 5 MG TABS tablet Take 1 tablet (5 mg total) by mouth 2 (two) times daily. 60 tablet 6   bismuth subsalicylate (PEPTO BISMOL) 262 MG/15ML suspension Take 30 mLs by mouth every 6 (six) hours as needed for indigestion.     colchicine (COLCRYS) 0.6 MG tablet Take 2 tabs once and then one tab one hour later as needed for gout 15 tablet 2   ergocalciferol (VITAMIN D2) 1.25 MG (50000 UT) capsule Take 50,000 Units by mouth once a week. Monday     hydrALAZINE (APRESOLINE) 25 MG tablet Take 1 tablet (25 mg total) by mouth 3 (three) times daily. 90 tablet 0   rosuvastatin (CRESTOR) 5 MG tablet Take 1 tablet (5 mg total) by mouth daily. 30 tablet 0   sodium bicarbonate 650 MG  tablet Take 1 tablet (650 mg total) by mouth 2 (two) times daily. 60 tablet 0   Tafamidis (VYNDAMAX) 61 MG CAPS Take 61 mg by mouth daily. 30 capsule 12   torsemide (DEMADEX) 20 MG tablet Take 20 mg by mouth as needed.     TRUEplus Lancets 33G MISC USE 4 (FOUR) TIMES DAILY 400 each 2   No current facility-administered medications on file prior to visit.     Review of Systems  Constitutional:  Negative for chills and fever.  Respiratory:  Positive for shortness of breath. Negative for cough and wheezing.   Cardiovascular:  Positive for leg swelling. Negative for chest pain and palpitations.  Gastrointestinal:  Positive for diarrhea. Negative for abdominal pain, blood in stool and constipation.  Musculoskeletal:  Positive for back pain.  Neurological:  Positive for dizziness. Negative for light-headedness and headaches.       Objective:   Vitals:   04/10/22 1041  BP: 138/68  Pulse: 60  Resp: 18  Temp: 98 F (36.7 C)   BP Readings from Last 3 Encounters:  04/10/22 138/68  04/05/22 120/70  03/31/22 139/83   Wt Readings from Last 3 Encounters:  04/10/22 175 lb (79.4 kg)  04/05/22 165 lb 3.2 oz (74.9 kg)  03/29/22 159 lb 6.3 oz (72.3 kg)   Body mass index is 28.25 kg/m.    Physical Exam Constitutional:      General: Corey [name] is not in acute distress.    Appearance: Normal appearance. Corey [name] is not ill-appearing.  HENT:     Head: Normocephalic and atraumatic.  Eyes:     Conjunctiva/sclera: Conjunctivae normal.  Cardiovascular:     Rate and Rhythm: Normal rate and regular rhythm.     Heart sounds: Normal heart sounds. No murmur heard. Pulmonary:     Effort: Pulmonary effort is normal. No respiratory distress.     Breath sounds: Normal breath sounds. No wheezing or rales.  Abdominal:     General: There is no distension.     Palpations: Abdomen is soft.     Tenderness: There is no abdominal tenderness.  Musculoskeletal:        General: Tenderness (Mild tenderness lower  lumbar spine , no tenderness in the right lower back or left lower palpation) present. No swelling or deformity.     Cervical back: Neck supple. No tenderness.     Right lower leg: Edema (Edema present-leg is wrapped still unable to quantify) present.     Left lower leg: Edema (1+ pitting edema) present.  Lymphadenopathy:     Cervical: No cervical adenopathy.  Skin:    General: Skin is warm and dry.     Findings: No rash.  Neurological:     Mental Status: Corey [name] is alert. Mental status is at baseline.  Psychiatric:        Mood and Affect: Mood normal.        Lab Results  Component Value Date   WBC 3.6 (L) 03/31/2022   HGB 10.1 (L) 03/31/2022   HCT 31.8 (L) 03/31/2022   PLT 122 (L) 03/31/2022   GLUCOSE 214 (H) 03/29/2022   CHOL 86 03/23/2022   TRIG 54 03/23/2022   HDL 44 03/23/2022   LDLCALC 31 03/23/2022   ALT 31 01/05/2022   AST 61 (H) 01/05/2022   NA 138 03/29/2022   K 3.5 03/29/2022   CL 103 03/29/2022   CREATININE 5.31 (H) 03/29/2022   BUN 92 (H) 03/29/2022   CO2 23 03/29/2022   TSH 1.520 09/27/2020   PSA 4.48 (H) 06/16/2015   INR 1.1 09/26/2020   HGBA1C 5.7 (H) 03/23/2022   MICROALBUR 8.5 (H) 11/23/2020   ECHO TEE    TRANSESOPHOGEAL ECHO REPORT       Patient Name:   TRINTON PREWITT Date of Exam: 03/24/2022 Medical Rec #:  [id number]    Height:       66.0 in Accession #:    [id number]   Weight:       167.5 lb Date of Birth:  June 02, 1945    BSA:          1.855 m Patient Age:    4 years     BP:           155/70 mmHg Patient Gender: M            HR:           63 bpm. Exam Location:  Inpatient  Procedure: Transesophageal Echo, Cardiac Doppler and Limited Color Doppler  Indications:     Abnormal EKG [[id number]]   History:         Patient has prior history of Echocardiogram examinations, most                  recent 03/23/2022. CHF, CAD, Arrythmias:Atrial Fibrillation,                  Signs/Symptoms:Edema; Risk Factors:Hypertension, Diabetes,                   Dyslipidemia and Former Smoker.   Sonographer:     Greer Pickerel Referring Phys:  [id number] Hebert Soho Diagnosing Phys: Hebert Soho  PROCEDURE: After discussion of the risks and benefits of a TEE, an informed consent was obtained from the patient. The transesophogeal probe was passed without difficulty through the esophogus of the patient. Imaged were obtained with the patient in a  left lateral decubitus position. Sedation performed by different physician. The patient's vital signs; including heart rate, blood pressure, and oxygen saturation; remained stable throughout the procedure. The patient developed no complications during  the procedure.   IMPRESSIONS   1. Left ventricular ejection fraction, by estimation, is 30 to 35%. The left ventricle has moderately decreased function. There is severe concentric left ventricular hypertrophy. Left ventricular diastolic function could not be evaluated.  2. Right ventricular systolic function is moderately reduced. The right ventricular size is normal. Moderately increased right ventricular wall thickness.  3. Left atrial size was severely dilated. No left atrial/left atrial appendage thrombus was detected.  4. Right atrial size was moderately dilated.  5. The mitral valve is grossly normal. Trivial mitral valve regurgitation. No evidence of mitral stenosis.  6. The aortic valve is grossly normal. There is mild thickening of the aortic valve. Aortic valve regurgitation is not visualized.  FINDINGS  Left Ventricle: Left ventricular ejection fraction, by estimation, is 30 to 35%. The left ventricle has moderately decreased function. The left ventricular internal cavity size was normal in size. There is severe concentric left ventricular hypertrophy.  Left ventricular diastolic function could not be evaluated.  Right Ventricle: The right ventricular size is normal. Moderately increased right ventricular wall thickness. Right ventricular systolic  function is moderately reduced.  Left Atrium: Left atrial size was severely dilated. No left atrial/left atrial appendage thrombus was detected.  Right Atrium: Right atrial size was moderately dilated.  Pericardium: There is no evidence of pericardial effusion.  Mitral Valve: The mitral valve is grossly normal. There is mild thickening of the mitral valve leaflet(s). Normal mobility of the mitral valve leaflets. Trivial mitral valve regurgitation. No evidence of mitral valve stenosis.  Tricuspid Valve: The tricuspid valve is normal in structure. Tricuspid valve regurgitation is trivial.  Aortic Valve: The aortic valve is grossly normal. There is mild thickening of the aortic valve. Aortic valve regurgitation is not visualized.  Pulmonic Valve: The pulmonic valve was normal in structure. Pulmonic valve regurgitation is trivial.  Aorta: The aortic root was not well visualized.  IAS/Shunts: No atrial level shunt detected by color flow Doppler.  Additional Comments: Spectral Doppler performed.  Aditya Sabharwal Electronically signed by Hebert Soho Signature Date/Time: 04/04/2022/1:26:48 PM      Final      Assessment & Plan:    See Problem List for Assessment and Plan of chronic medical problems.

## 2022-04-10 ENCOUNTER — Ambulatory Visit (INDEPENDENT_AMBULATORY_CARE_PROVIDER_SITE_OTHER): Payer: Medicare HMO | Admitting: Internal Medicine

## 2022-04-10 ENCOUNTER — Ambulatory Visit (INDEPENDENT_AMBULATORY_CARE_PROVIDER_SITE_OTHER): Payer: Medicare HMO

## 2022-04-10 ENCOUNTER — Telehealth: Payer: Self-pay

## 2022-04-10 VITALS — BP 138/68 | HR 60 | Temp 98.0°F | Resp 18 | Ht 66.0 in | Wt 175.0 lb

## 2022-04-10 DIAGNOSIS — M545 Low back pain, unspecified: Secondary | ICD-10-CM | POA: Insufficient documentation

## 2022-04-10 DIAGNOSIS — E7800 Pure hypercholesterolemia, unspecified: Secondary | ICD-10-CM

## 2022-04-10 DIAGNOSIS — I5043 Acute on chronic combined systolic (congestive) and diastolic (congestive) heart failure: Secondary | ICD-10-CM

## 2022-04-10 DIAGNOSIS — I10 Essential (primary) hypertension: Secondary | ICD-10-CM

## 2022-04-10 DIAGNOSIS — E1122 Type 2 diabetes mellitus with diabetic chronic kidney disease: Secondary | ICD-10-CM

## 2022-04-10 DIAGNOSIS — Z23 Encounter for immunization: Secondary | ICD-10-CM | POA: Diagnosis not present

## 2022-04-10 DIAGNOSIS — I480 Paroxysmal atrial fibrillation: Secondary | ICD-10-CM | POA: Diagnosis not present

## 2022-04-10 DIAGNOSIS — M1A9XX0 Chronic gout, unspecified, without tophus (tophi): Secondary | ICD-10-CM | POA: Diagnosis not present

## 2022-04-10 DIAGNOSIS — R937 Abnormal findings on diagnostic imaging of other parts of musculoskeletal system: Secondary | ICD-10-CM

## 2022-04-10 DIAGNOSIS — N184 Chronic kidney disease, stage 4 (severe): Secondary | ICD-10-CM | POA: Diagnosis not present

## 2022-04-10 DIAGNOSIS — S81801A Unspecified open wound, right lower leg, initial encounter: Secondary | ICD-10-CM | POA: Diagnosis not present

## 2022-04-10 NOTE — Assessment & Plan Note (Signed)
Chronic Paroxysmal atrial fib/flutter To be in normal rhythm.  Today Rate controlled On Eliquis

## 2022-04-10 NOTE — Patient Instructions (Addendum)
     Flu immunization administered today.     Have an xray of your lower back today   Take tylenol as needed for your back pain.     Medications changes include :   none     Return if symptoms worsen or fail to improve.

## 2022-04-10 NOTE — Assessment & Plan Note (Signed)
Chronic Started over 6 months ago Has home wound care nurse and going to the wound clinic Per patient and his wife area is looking better-did not examine since he has a home nurse and going to the wound clinic

## 2022-04-10 NOTE — Assessment & Plan Note (Signed)
Chronic Following with Dr. Ollen Barges an appointment this week Fluid overloaded here today-advised that he take a torsemide 20 mg today Continue weighing daily, but reviewed that he should take torsemide if his weight increases 2 pounds or if he has leg swelling

## 2022-04-10 NOTE — Assessment & Plan Note (Signed)
Chronic Blood pressure okay, but slightly higher than ideal  Continue hydralazine 25 mg 3 times daily

## 2022-04-10 NOTE — Assessment & Plan Note (Signed)
Chronic Denies any issues with gout, but had a gout flare while in the hospital Ortho saw him and gave him a steroid injection Arthrocentesis negative for infection

## 2022-04-10 NOTE — Assessment & Plan Note (Signed)
Chronic Has home PT at this time-stressed utilizing PT as long as possible to help improve his strength and possibly help with his back pain

## 2022-04-10 NOTE — Assessment & Plan Note (Signed)
Chronic Lab Results  Component Value Date   HGBA1C 5.7 (H) 03/23/2022   Sugars controlled without medication Continue diabetic diet

## 2022-04-10 NOTE — Assessment & Plan Note (Signed)
Acute States he has been having pain across his lower back for a month or so He did fall out of bed at 1 point, but denies any other injury and is not sure if that resulted in the back pain No prior evaluation X-ray today No signs of radiculopathy He has been taking 1 Tylenol and he states it does help some-advised taking 2 Tylenol as needed Will try to avoid anything stronger at this point Can refer to sports medicine if pain is not improving or worsening

## 2022-04-10 NOTE — Telephone Encounter (Signed)
Please call - his lower spine vertebrae appears abnormal -- we need to do a ct scan to evaluate further - this has been ordered.

## 2022-04-10 NOTE — Assessment & Plan Note (Signed)
Recent hospitalization for acute on chronic combined systolic and diastolic heart failure Was diuresed while in the hospital Discharged on torsemide 20 mg daily for 2 pound weight gain or edema-he has taken a dose twice Today he states weight has increased at home and he does have edema Advised that he take torsemide today and may need another dose this week Discussed watching his weight-he is weighing himself daily and to use that as a guide when to take the torsemide Sees Dr. Royce Macadamia this week

## 2022-04-11 ENCOUNTER — Telehealth: Payer: Self-pay | Admitting: Cardiology

## 2022-04-11 NOTE — Telephone Encounter (Signed)
Pt c/o swelling: STAT is pt has developed SOB within 24 hours  If swelling, where is the swelling located?   Right leg, edema  How much weight have you gained and in what time span? 10 lbs in 6 days  Have you gained 3 pounds in a day or 5 pounds in a week? Yes  Do you have a log of your daily weights (if so, list)?   Yes  Are you currently taking a fluid pill?   Yes  Are you currently SOB?   A little  Have you traveled recently?   No   Caller stated he could not get an oxygen saturation reading for the patient today.  Patient has mild to moderate symptoms of SOB and fatigue, increased with exertion, which started about a week ago.  Caller stated can return call to him or patient's wife at [phone].

## 2022-04-11 NOTE — Telephone Encounter (Signed)
Spoke with patient's spouse today and info given.

## 2022-04-11 NOTE — Telephone Encounter (Signed)
[facility] Nurse called in to report patient weight gain of 10lbs in 6 days and Right leg, edema. SOB at patient baseline.  Spoke with patient's wife, she states he takes torsemide '20mg'$  as needed only and had one dose yesterday. Advised he take another dose today and elevate his legs. Then weigh again in the morning and evaluate his legs and call us back with an update.   Discussed diet and sodium intake over the weekend and provided education.

## 2022-04-12 MED ORDER — TORSEMIDE 20 MG PO TABS: 20.0000 mg | ORAL_TABLET | Freq: Every day | ORAL | 3 refills | Status: DC

## 2022-04-12 NOTE — Telephone Encounter (Signed)
Spoke with patient's wife to advise per Dr Marlou Porch to start taking torsemide '20mg'$  daily. Continue with daily weights and follow a low sodium diet.   Patient's wife requested more information on cooking low sodium. Advised we would give this information on 04/13/22 at next appointment.

## 2022-04-12 NOTE — Addendum Note (Signed)
Addended by: Vergia Alcon A on: 04/12/2022 09:14 AM   Modules accepted: Orders

## 2022-04-13 ENCOUNTER — Ambulatory Visit: Payer: Medicare HMO | Attending: Cardiovascular Disease | Admitting: Cardiovascular Disease

## 2022-04-13 ENCOUNTER — Encounter: Payer: Self-pay | Admitting: Cardiovascular Disease

## 2022-04-13 ENCOUNTER — Encounter (HOSPITAL_BASED_OUTPATIENT_CLINIC_OR_DEPARTMENT_OTHER): Payer: Medicare HMO | Attending: Internal Medicine | Admitting: Internal Medicine

## 2022-04-13 VITALS — BP 120/74 | HR 48 | Ht 66.0 in | Wt 169.6 lb

## 2022-04-13 DIAGNOSIS — N184 Chronic kidney disease, stage 4 (severe): Secondary | ICD-10-CM | POA: Insufficient documentation

## 2022-04-13 DIAGNOSIS — L97918 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 with other specified severity: Secondary | ICD-10-CM

## 2022-04-13 DIAGNOSIS — Z87891 Personal history of nicotine dependence: Secondary | ICD-10-CM | POA: Insufficient documentation

## 2022-04-13 DIAGNOSIS — I5042 Chronic combined systolic (congestive) and diastolic (congestive) heart failure: Secondary | ICD-10-CM | POA: Insufficient documentation

## 2022-04-13 DIAGNOSIS — I25119 Atherosclerotic heart disease of native coronary artery with unspecified angina pectoris: Secondary | ICD-10-CM | POA: Insufficient documentation

## 2022-04-13 DIAGNOSIS — I87311 Chronic venous hypertension (idiopathic) with ulcer of right lower extremity: Secondary | ICD-10-CM | POA: Diagnosis not present

## 2022-04-13 DIAGNOSIS — I480 Paroxysmal atrial fibrillation: Secondary | ICD-10-CM

## 2022-04-13 DIAGNOSIS — E11622 Type 2 diabetes mellitus with other skin ulcer: Secondary | ICD-10-CM | POA: Insufficient documentation

## 2022-04-13 DIAGNOSIS — I5020 Unspecified systolic (congestive) heart failure: Secondary | ICD-10-CM | POA: Diagnosis not present

## 2022-04-13 DIAGNOSIS — I130 Hypertensive heart and chronic kidney disease with heart failure and stage 1 through stage 4 chronic kidney disease, or unspecified chronic kidney disease: Secondary | ICD-10-CM | POA: Diagnosis not present

## 2022-04-13 DIAGNOSIS — E1122 Type 2 diabetes mellitus with diabetic chronic kidney disease: Secondary | ICD-10-CM | POA: Insufficient documentation

## 2022-04-13 MED ORDER — ROSUVASTATIN CALCIUM 5 MG PO TABS: 5.0000 mg | ORAL_TABLET | Freq: Every day | ORAL | 3 refills | Status: DC

## 2022-04-13 NOTE — Patient Instructions (Signed)
Medication Instructions:  Your physician recommends that you continue on your current medications as directed. Please refer to the Current Medication list given to you today.  *If you need a refill on your cardiac medications before your next appointment, please call your pharmacy*  Lab Work: BMET and CBC If you have labs (blood work) drawn today and your tests are completely normal, you will receive your results only by: MyChart Message (if you have MyChart) OR A paper copy in the mail If you have any lab test that is abnormal or we need to change your treatment, we will call you to review the results.  Testing/Procedures: Your physician has recommended that you have an ablation. Catheter ablation is a medical procedure used to treat some cardiac arrhythmias (irregular heartbeats). During catheter ablation, a long, thin, flexible tube is put into a blood vessel in your groin (upper thigh), or neck. This tube is called an ablation catheter. It is then guided to your heart through the blood vessel. Radio frequency waves destroy small areas of heart tissue where abnormal heartbeats may cause an arrhythmia to start. Please see the instruction sheet given to you today.   Follow-Up: At [facility], you and your health needs are our priority.  As part of our continuing mission to provide you with exceptional heart care, we have created designated Provider Care Teams.  These Care Teams include your primary Cardiologist (physician) and Advanced Practice Providers (APPs -  Physician Assistants and Nurse Practitioners) who all work together to provide you with the care you need, when you need it.  Your next appointment:   See instruction letter  Important Information About Sugar       

## 2022-04-13 NOTE — Progress Notes (Signed)
Electrophysiology Office Note:    Date:  04/13/2022   ID:  DECKARD STUBER, DOB 07/12/1945, MRN [id number]  PCP:  [name], MD   [place] [facility] Cardiologist:  Candee Furbish, MD Electrophysiologist:  Melida Quitter, MD     Referring MD: Hebert Soho, DO   History of Present Illness:    Corey [name] is a 76 y.o. male with a hx detailed below, significant for ATTR cardiac amyloid, pericardial effusion status post window, CHFrEF, CAD ESRD approaching dialysis, and atrial fibrillation and flutter referred for arrhythmia management.  In 2022, Mr. Victory presented to [facility] with [place]. LHC at that time demonstrated 80% occlusion of the LAD, however, PCI was deferred due to markedly elevated LVEDP. LVEF by echo at that time of 35%-40%. Due to a large pericardial effusion, he had a window done with biopsies at that time compatible with amyloidosis. AL evaluation was negative. PYP was equivocal. Genetic testing consistent with Val142Ile TTR.   He was admitted in early November 2023 with volume overload and an atrial fibrillation occasionally organizing into fibrillation. He was diuresed, underwent TEE/DCCV x 2. When in sinus rhythm, he was often bradycardic with sinus brady and Mobitz I AV block. I saw him a few times during this admission and recommended placement of a holter monitor on discharge to assess chronotropic competence.   Past Medical History:  Diagnosis Date   Cataract    CHF (congestive heart failure) (HCC)    Chronic heart failure with preserved ejection fraction (HFpEF) (HCC)    Colon polyp 2003   Dr [name]; F/U was to be 2008( not completed)   Coronary artery disease    CVA (cerebral infarction) 2014   Diabetes mellitus    Diverticulosis 2003   Dyspnea    with exertion   Gout    Hereditary cardiac amyloidosis (HCC)    Hypertension    Myocardial infarction ([place]) 09/19/2012   PEA cardiac arrest in setting of acute respiratory  failure/pulmonary edema   OSA (obstructive sleep apnea) 03/27/2018   Pneumonia 09/29/2011   Avelox X 10 days as OP   Prostate cancer ([place])    Renal insufficiency    Seizures ([place]) 09/19/2012   not treated for seizure disorder; had a seizure after stroke 2014; no seizure since then   Stroke [facility])     Past Surgical History:  Procedure Laterality Date   AV FISTULA PLACEMENT Left 01/31/2022   Procedure: LEFT ARM ARTERIOVENOUS (AV) FISTULA CREATION;  Surgeon: Waynetta Sandy, MD;  Location: [place];  Service: Vascular;  Laterality: Left;  regional block to left arm   CARDIOVERSION N/A 03/24/2022   Procedure: CARDIOVERSION;  Surgeon: Hebert Soho, DO;  Location: [place] [facility];  Service: Cardiovascular;  Laterality: N/A;   COLONOSCOPY W/ POLYPECTOMY  2003   no F/U ([place] discussed 12/03/12)   INGUINAL HERNIA REPAIR Left 06/16/2021   Procedure: LAPAROSCOPIC, POSSIBLY OPEN LEFT INGUINAL HERNIA REPAIR WITH MESH;  Surgeon: Felicie Morn, MD;  Location: [facility];  Service: General;  Laterality: Left;   INSERTION OF MESH N/A 07/16/2017   Procedure: INSERTION OF MESH;  Surgeon: [name], MD;  Location: [place] OR;  Service: General;  Laterality: N/A;   PEG PLACEMENT  2014   10/08/12-12/10/12   PEG TUBE REMOVAL  2014   PROSTATE BIOPSY     RIGHT HEART CATH N/A 03/24/2022   Procedure: RIGHT HEART CATH;  Surgeon: Hebert Soho, DO;  Location: [place] CV LAB;  Service: Cardiovascular;  Laterality: N/A;   RIGHT/LEFT HEART CATH AND CORONARY ANGIOGRAPHY N/A 09/20/2020   Procedure: RIGHT/LEFT HEART CATH AND CORONARY ANGIOGRAPHY;  Surgeon: Leonie Man, MD;  Location: [place] CV LAB;  Service: Cardiovascular;  Laterality: N/A;   TEE WITHOUT CARDIOVERSION N/A 09/27/2020   Procedure: TRANSESOPHAGEAL ECHOCARDIOGRAM (TEE);  Surgeon: Wonda Olds, MD;  Location[facility] OR;  Service: Thoracic;  Laterality: N/A;   TEE WITHOUT CARDIOVERSION N/A 03/24/2022   Procedure:  TRANSESOPHAGEAL ECHOCARDIOGRAM (TEE);  Surgeon: Hebert Soho, DO;  Location: [place] [facility];  Service: Cardiovascular;  Laterality: N/A;   TRACHEOSTOMY  2014   09/30/12-10/20/12   VENTRAL HERNIA REPAIR N/A 07/16/2017   Procedure: LAPAROSCOPIC VENTRAL HERNIA REPAIR WITH MESH;  Surgeon: [name], MD;  Location: [place];  Service: General;  Laterality: N/A;   wrist aspiration  02/16/2012    monosodium urate crystals; Dr Caralyn Guile    Current Medications: Current Meds  Medication Sig   ACCU-CHEK AVIVA PLUS test strip TEST BLOOD SUGAR TWICE DAILY AS DIRECTED   albuterol (VENTOLIN HFA) 108 (90 Base) MCG/ACT inhaler Inhale 2 puffs into the lungs every 4 (four) hours as needed for wheezing or shortness of breath.   allopurinol (ZYLOPRIM) 100 MG tablet Take 100 mg by mouth as needed.   apixaban (ELIQUIS) 5 MG TABS tablet Take 1 tablet (5 mg total) by mouth 2 (two) times daily.   bismuth subsalicylate (PEPTO BISMOL) 262 MG/15ML suspension Take 30 mLs by mouth every 6 (six) hours as needed for indigestion.   colchicine (COLCRYS) 0.6 MG tablet Take 2 tabs once and then one tab one hour later as needed for gout   ergocalciferol (VITAMIN D2) 1.25 MG (50000 UT) capsule Take 50,000 Units by mouth once a week. Monday   hydrALAZINE (APRESOLINE) 25 MG tablet Take 1 tablet (25 mg total) by mouth 3 (three) times daily.   rosuvastatin (CRESTOR) 5 MG tablet Take 1 tablet (5 mg total) by mouth daily.   sodium bicarbonate 650 MG tablet Take 1 tablet (650 mg total) by mouth 2 (two) times daily.   Tafamidis (VYNDAMAX) 61 MG CAPS Take 61 mg by mouth daily.   torsemide (DEMADEX) 20 MG tablet Take 1 tablet (20 mg total) by mouth daily.   TRUEplus Lancets 33G MISC USE 4 (FOUR) TIMES DAILY   [DISCONTINUED] rosuvastatin (CRESTOR) 5 MG tablet Take 1 tablet (5 mg total) by mouth daily.     Allergies:   Hydrochlorothiazide   Social History   Socioeconomic History   Marital status: Married    Spouse name: [name]    Number of children: 2   Years of education: 12   Highest education level: 12th grade  Occupational History   Not on file  Tobacco Use   Smoking status: Former    Packs/day: 0.25    Years: 1.00    Total pack years: 0.25    Types: Cigarettes    Quit date: 05/15/1968    Years since quitting: 53.9   Smokeless tobacco: Never   Tobacco comments:    smoked [place], up to 1-2 cigarettes/ day  Vaping Use   Vaping Use: Never used  Substance and Sexual Activity   Alcohol use: No    Alcohol/week: 0.0 standard drinks of alcohol   Drug use: No   Sexual activity: Yes  Other Topics Concern   Not on file  Social History Narrative   Walks three times a week, 2-3 miles, occasional walks on treadmill  Patient is right-handed. He lives with his wife in a one level home. He drinks 1 diet Mt. Dew a day and rarely has coffee. He walks 3 x a week for exercise.   Social Determinants of Health   Financial Resource Strain: Low Risk  (02/02/2022)   Overall Financial Resource Strain (CARDIA)    Difficulty of Paying Living Expenses: Not hard at all  Food Insecurity: No Food Insecurity (02/02/2022)   Hunger Vital Sign    Worried About Running Out of Food in the Last Year: Never true    Ran Out of Food in the Last Year: Never true  Transportation Needs: No Transportation Needs (02/02/2022)   PRAPARE - Hydrologist (Medical): No    Lack of Transportation (Non-Medical): No  Physical Activity: Inactive (02/02/2022)   Exercise Vital Sign    Days of Exercise per Week: 0 days    Minutes of Exercise per Session: 0 min  Stress: No Stress Concern Present (02/02/2022)   [place]    Feeling of Stress : Not at all  Social Connections: [place] (02/02/2022)   Social Connection and Isolation Panel [NHANES]    Frequency of Communication with Friends and Family: More than three times a week    Frequency  of Social Gatherings with Friends and Family: More than three times a week    Attends Religious Services: More than 4 times per year    Active Member of [employer] or Organizations: Yes    Attends Music therapist: More than 4 times per year    Marital Status: Married     Family History: The patient's family history includes Cancer in his father; Diabetes in his father and mother; Heart failure in his brother and mother; Hypertension in his father and mother; Prostate cancer in his father. There is no history of Stroke, Heart disease, or Colon cancer.  ROS:   Please see the history of present illness.    All other systems reviewed and are negative.  EKGs/Labs/Other Studies Reviewed Today:    TTE 03/23/2022 Speckled appearance consistent with amyloid Global hypokinesis, EF 30-35%, severe LVH LA and RA are moderately dilated  EKG:  Last EKG results: today - atrial flutter, appears typical   Recent Labs: 01/05/2022: ALT 31; Pro B Natriuretic peptide (BNP) 1,740.0 03/22/2022: B Natriuretic Peptide 1,571.3 03/29/2022: BUN 92; Creatinine, Ser 5.31; Potassium 3.5; Sodium 138 03/31/2022: Hemoglobin 10.1; Platelets 122     Physical Exam:    VS:  BP 120/74   Pulse (!) 48   Ht '5\' 6"'$  (1.676 m)   Wt 169 lb 9.6 oz (76.9 kg)   SpO2 99%   BMI 27.37 kg/m     Wt Readings from Last 3 Encounters:  04/13/22 169 lb 9.6 oz (76.9 kg)  04/10/22 175 lb (79.4 kg)  04/05/22 165 lb 3.2 oz (74.9 kg)     GEN:  Well nourished, well developed in no acute distress CARDIAC: RRR, no murmurs, rubs, gallops RESPIRATORY:  Normal work of breathing MUSCULOSKELETAL: no edema    ASSESSMENT & PLAN:    Atrial flutter and fibrillation: Appears typical, but he has had atrial fibrillation in the past, left atrium is severely enlarged. I had a long discussion with the patient and his wife. At present, they would like to address the current arrhythmia, which I think is typical flutter. I did explain that  he has had documented atrial fibrillation and has amyloid  and a severely enlarged left atrium which makes it likely he will continue to have AF, so I think it would be advisable to address both the fibrillation and flutter during the ablation. they would prefer to address the current, active arrhythmia.  If it is determined to be an atypical (left-side) flutter during mapping, we will cross to the left and perform a PVI and atypical flutter ablation. We discussed the indication, rationale, logistics, anticipated benefits, and potential risks of the ablation procedure including but not limited to -- bleed at the groin access site, chest pain, damage to nearby organs such as the diaphragm, lungs, or esophagus, need for a drainage tube, or prolonged hospitalization. I explained that the risk for stroke, heart attack, need for open chest surgery, or even death is very low but not zero. he  expressed understanding and wishes to proceed. He remains on uninterrupted eliquis. Given comorbidities including CHFrEF, and the possibility of a left-side procedure, I think TEE is reasonable to exclude LAA thrombus. CHFrEF: EF 40% due to cardiac amyloid. On tafamidis '61mg'$  daily. Secondary hypercoagulable state: continue apixaban.        Medication Adjustments/Labs and Tests Ordered: Current medicines are reviewed at length with the patient today.  Concerns regarding medicines are outlined above.  No orders of the defined types were placed in this encounter.  Meds ordered this encounter  Medications   rosuvastatin (CRESTOR) 5 MG tablet    Sig: Take 1 tablet (5 mg total) by mouth daily.    Dispense:  90 tablet    Refill:  3     Signed, Melida Quitter, MD  04/13/2022 3:12 PM    [place]

## 2022-04-13 NOTE — Addendum Note (Signed)
Addended by: Ronie Spies on: 04/13/2022 03:31 PM   Modules accepted: Orders

## 2022-04-13 NOTE — Telephone Encounter (Signed)
Patient and his wife came in to see Dr. Myles Gip today and low sodium diet was attached to his AVS

## 2022-04-13 NOTE — Progress Notes (Signed)
DELVECCHIO, MADOLE ([id number]) [id number] Page 1 of 9 Visit Report for 04/13/2022 Chief Complaint Document Details Patient Name: Date of Service: Corey [name] 04/13/2022 12:30 PM Medical Record Number: [id number] Patient Account Number: [id number] Date of Birth/Sex: Treating RN: 1946/04/22 (76 y.o. M) Primary Care Provider: Billey Gosling Other Clinician: Referring Provider: Treating Provider/Extender: Mickle Asper in Treatment: 23 Information Obtained from: Patient Chief Complaint 11/01/2021; right lower extremity wound Electronic Signature(s) Signed: 04/13/2022 4:36:48 PM By: Kalman Shan DO Entered By: Kalman Shan on 04/13/2022 14:01:20 -------------------------------------------------------------------------------- HPI Details Patient Name: Date of Service: Corey [name], [name] 04/13/2022 12:30 PM Medical Record Number: [id number] Patient Account Number: [id number] Date of Birth/Sex: Treating RN: 02-Jul-1945 (76 y.o. M) Primary Care Provider: Billey Gosling Other Clinician: Referring Provider: Treating Provider/Extender: Mickle Asper in Treatment: 34 History of Present Illness HPI Description: Admission 11/01/2021 Corey [name] Is a 76 year old male with a past medical history of of type 2 diabetes, coronary artery disease, stage IV kidney disease acute on chronic combined systolic and diastolic heart failure that presents to the clinic for a 47-monthhistory of right lower extremity wound. He states he is not sure how this started. It is completely eschared. He reports his wound has been stable for the past month. He has been keeping the area covered. He has mild chronic pain. He denies signs of infection. He states he has a history of lower extremity ulcers last being on the left leg. He could not tell me how this was treated in the past. 6/27; patient presents for follow-up. He is scheduled for his  lower extremity arterial studies on July 6th. He has been using Medihoney to the wound bed. He has no issues or complaints today. He denies signs of infection. 7/6; patient presents for follow-up. He has home health that reported the wound having odor and increased pain last week. He was sent to urgent care. He was started on doxycycline. He is continuing Medihoney. He has his scheduled ABIs w/TBIs today. 7/10; patient presents for follow-up. He had his arterial studies done last week. He had triphasic waveforms throughout the right lower extremity. His TBI was 0.64. He has good pedal pulses. He is almost done with his course of antibiotics. He has been using mupirocin ointment to the wound bed. 7/17; patient presents for follow-up. He has been using Iodosorb under compression therapy. He has no issues or complaints today. He states he is receiving Keystone antibiotic tomorrow In the mail. 7/24; patient presents for follow-up. He has been doing Dakin's wet-to-dry dressings to the wound bed daily without issues. He currently denies signs of infection. He reports completing his course of doxycycline. He has his Keystone antibiotic with him today. 7/31; patient presents for follow-up. We have been using Iodoflex [facility]. He has no issues or complaints today. He denies signs of infection. 8/7; patient presents for follow-up. We have been using Iodoflex with [facility]. Home health came out once last week to change the wrap. He has no issues or complaints today. He denies signs of infection. 8/14; patient presents for follow-up. We have been using Keystone with Hydrofera Blue under compression therapy. He has tolerated this well. He has no issues or complaints today. Corey [name], Corey [name]([id number] Page 2 of 9 8/21; patient presents for follow-up. We have been using Keystone with Hydrofera Blue under compression  therapy. The Hydrofera Blue was stuck on tightly  to the wound bed today. Other than that he has no issues or complaints. We have not heard back for insurance approval for skin sub. 8/28; patient presents for follow-up. We ordered blast X and collagen powder at last clinic visit. It is unclear if he has received this. We placed Keystone with Fibracol under 3 layer compression last week. Patient has home health. Patient has no issues or complaints today. He was not approved for TheraSkin. 9/5; patient presents for follow-up. Insurance would not cover blast X. We do have a sample and he would like to try this. We have been using Keystone antibiotic and collagen under 3 layer compression. He has no issues or complaints today. 9/18; patient presents for follow-up. We have been using collagen with blast X and Keystone under compression therapy. He has no issues or complaints today. 9/25; patient presents for follow-up. We have continued to use collagen, blast X and Keystone under compression therapy. Patient has no issues or complaints today. 10/6; patient presents for follow-up. We have been using collagen, blast X and Keystone under compression therapy. He denies signs of infection and has no issues or complaints today. Patient has home health that comes out on Wednesdays and Fridays. 10/16; patient presents for follow-up. He has been using collagen, blast X and Keystone with benefit to the wound bed under compression therapy. He is about to run out of [name] antibiotic ointment. He has no issues or complaints today. 10/23; patient presents for follow-up. We continue to use collagen, blast X and Keystone under compression therapy. Continued report in wound healing. Home health continues to come out to do dressing changes. Patient has no issues or complaints today. 10/30; patient presents for follow-up. We have been using collagen, blast X and Key[facility] antibiotic under compression therapy. His wound  continues to show improvement in healing. He has no issues or complaints today. 11/30; patient was last seen 1 month ago. He has missed his last clinic visit. He has home health who changes the dressings 3 times weekly. He has been using collagen with Key[facility] antibiotic under 3 layer compression. He has run out of [place] antibiotic ointment. The wound is almost healed. Electronic Signature(s) Signed: 04/13/2022 4:36:48 PM By: Kalman Shan DO Entered By: Kalman Shan on 04/13/2022 14:18:19 -------------------------------------------------------------------------------- Physical Exam Details Patient Name: Date of Service: Corey [name], [name] 04/13/2022 12:30 PM Medical Record Number: [id number] Patient Account Number: [id number] Date of Birth/Sex: Treating RN: 09-Sep-1945 (76 y.o. M) Primary Care Provider: Billey Gosling Other Clinician: Referring Provider: Treating Provider/Extender: Mickle Asper in Treatment: 14 Constitutional respirations regular, non-labored and within target range for patient.. Cardiovascular 2+ dorsalis pedis/posterior tibialis pulses. Psychiatric pleasant and cooperative. Notes Right lower extremity: T the anterior aspect there is granulation tissue. No signs of surrounding infection. Good edema control. o Electronic Signature(s) Signed: 04/13/2022 4:36:48 PM By: Kalman Shan DO Entered By: Kalman Shan on 04/13/2022 14:26:05 -------------------------------------------------------------------------------- Physician Orders Details Patient Name: Date of Service: Corey [name], [name] 04/13/2022 12:30 PM Medical Record Number: [id number] Patient Account Number: [id number] Corey [name], Corey [name] ([id number]) [id number] Page 3 of 9 Date of Birth/Sex: Treating RN: 12/21/1945 (76 y.o. [name] Primary Care Provider: Other Clinician: Billey Gosling Referring Provider: Treating Provider/Extender: Mickle Asper in Treatment: 33 Verbal / Phone Orders: No Diagnosis Coding Follow-up Appointments ppointment in 1 week. - w/ Dr. Heber Sheridan Return A Other: - ****[facility] topical compounding antibiotics bring to appt each time.**** Bring  in the Blastx as well. Anesthetic (In clinic) Topical Lidocaine 5% applied to wound bed (In clinic) Topical Lidocaine 4% applied to wound bed Cellular or Tissue Based Products Cellular or Tissue Based Product Type: - run insurance for [employer], epicord, and organogenesis advance tissue product. Theraskin not approved. Epicord not covered. Organ[employer] Apligraf approved. Bathing/ Shower/ Hygiene May shower with protection but do not get wound dressing(s) wet. Edema Control - Lymphedema / SCD / Other Elevate legs to the level of the heart or above for 30 minutes daily and/or when sitting, a frequency of: - 3-4 times a day throughout the day. Avoid standing for long periods of time. Patient to wear own compression stockings every day. - We will apply a tubi grip size E double folded today Home Health New wound care orders this week; continue Home Health for wound care. May utilize formulary equivalent dressing for wound treatment orders unless otherwise specified. Alvis Lemmings to change 2 x a week. Only doing keystone and collagen under 3 layer. Once [place] runs out just do collagen. Other Home Health Orders/Instructions: - [facility] Wound Treatment Wound #1 - Lower Leg Wound Laterality: Right, Lateral Cleanser: Soap and Water (Home Health) 3 x Per Week/30 Days Discharge Instructions: May shower and wash wound with dial antibacterial soap and water prior to dressing change. Cleanser: Wound Cleanser (Home Health) 3 x Per Week/30 Days Discharge Instructions: Cleanse the wound with wound cleanser prior to applying a clean dressing using gauze sponges, not tissue or cotton balls. Topical: [facility]-topical compounding  antibiotics (Home Health) 3 x Per Week/30 Days Discharge Instructions: apply directly to wound bed. Prim Dressing: Fibracol Plus Dressing, 4x4.38 in (collagen) 3 x Per Week/30 Days ary Discharge Instructions: Moisten collagen with saline or hydrogel until the blastx and powder collagen arrive. Secondary Dressing: ABD Pad, 8x10 (Home Health) 3 x Per Week/30 Days Discharge Instructions: Apply over primary dressing as directed. Secondary Dressing: Woven Gauze Sponge, Non-Sterile 4x4 in (Home Health) 3 x Per Week/30 Days Discharge Instructions: Apply over primary dressing as directed. Compression Wrap: ThreePress (3 layer compression wrap) 3 x Per Week/30 Days Discharge Instructions: Apply three layer compression as directed. Electronic Signature(s) Signed: 04/13/2022 4:36:48 PM By: Kalman Shan DO Entered By: Kalman Shan on 04/13/2022 14:26:16 -------------------------------------------------------------------------------- Problem List Details Patient Name: Date of Service: Corey [name], [name] 04/13/2022 12:30 PM Medical Record Number: [id number] Patient Account Number: [id number] Corey [name], Corey [name] ([id number]) [id number] Page 4 of 9 Date of Birth/Sex: Treating RN: 06/17/1945 (76 y.o. M) Primary Care Provider: Other Clinician: Billey Gosling Referring Provider: Treating Provider/Extender: Mickle Asper in Treatment: 23 Active Problems ICD-10 Encounter Code Description Active Date MDM Diagnosis L97.918 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 with other 11/01/2021 No Yes specified severity E11.622 Type 2 diabetes mellitus with other skin ulcer 11/01/2021 No Yes I50.42 Chronic combined systolic (congestive) and diastolic (congestive) heart failure 11/01/2021 No Yes I25.119 Atherosclerotic heart disease of native coronary artery with unspecified angina 11/01/2021 No Yes pectoris I87.311 Chronic venous hypertension  (idiopathic) with ulcer of right lower extremity 12/26/2021 No Yes Inactive Problems Resolved Problems Electronic Signature(s) Signed: 04/13/2022 4:36:48 PM By: Kalman Shan DO Entered By: Kalman Shan on 04/13/2022 14:01:02 -------------------------------------------------------------------------------- Progress Note Details Patient Name: Date of Service: Corey [name], [name] 04/13/2022 12:30 PM Medical Record Number: [id number] Patient Account Number: [id number] Date of Birth/Sex: Treating RN: 1946-03-15 (76 y.o. M) Primary Care Provider: Billey Gosling Other Clinician: Referring Provider:  Treating Provider/Extender: [name] in Treatment: 23 Subjective Chief Complaint Information obtained from Patient 11/01/2021; right lower extremity wound History of Present Illness (HPI) Admission 11/01/2021 [name] Is a 75 year old male with a past medical history of of type 2 diabetes, coronary artery disease, stage IV kidney disease acute on chronic combined systolic and diastolic heart failure that presents to the clinic for a 58-monthhistory of right lower extremity wound. He states he is not sure how this started. It is completely eschared. He reports his wound has been stable for the past month. He has been keeping the area covered. He has mild chronic pain. He denies signs of infection. He states he has a history of lower extremity ulcers last being on the left leg. He could not tell me how this was treated in the past. 6/27; patient presents for follow-up. He is scheduled for his lower extremity arterial studies on July 6th. He has been using Medihoney to the wound bed. He has no issues or complaints today. He denies signs of infection. 7/6; patient presents for follow-up. He has home health that reported the wound having odor and increased pain last week. He was sent to urgent care. He was Corey [name], Corey [name]([id number]  Page 5 of 9 started on doxycycline. He is continuing Medihoney. He has his scheduled ABIs w/TBIs today. 7/10; patient presents for follow-up. He had his arterial studies done last week. He had triphasic waveforms throughout the right lower extremity. His TBI was 0.64. He has good pedal pulses. He is almost done with his course of antibiotics. He has been using mupirocin ointment to the wound bed. 7/17; patient presents for follow-up. He has been using Iodosorb under compression therapy. He has no issues or complaints today. He states he is receiving [facility] antibiotic tomorrow In the mail. 7/24; patient presents for follow-up. He has been doing Dakin's wet-to-dry dressings to the wound bed daily without issues. He currently denies signs of infection. He reports completing his course of doxycycline. He has his [facility] antibiotic with him today. 7/31; patient presents for follow-up. We have been [facility]. He has no issues or complaints today. He denies signs of infection. 8/7; patient presents for follow-up. We have been [facility]. Home health came out once last week to change the wrap. He has no issues or complaints today. He denies signs of infection. 8/14; patient presents for follow-up. We have been using Keystone with Hydrofera Blue under compression therapy. He has tolerated this well. He has no issues or complaints today. 8/21; patient presents for follow-up. We have been using Keystone with Hydrofera Blue under compression therapy. The Hydrofera Blue was stuck on tightly to the wound bed today. Other than that he has no issues or complaints. We have not heard back for insurance approval for skin sub. 8/28; patient presents for follow-up. We ordered blast X and collagen powder at last clinic visit. It is unclear if he has received this. We placed Keystone with Fibracol under 3 layer compression last  week. Patient has home health. Patient has no issues or complaints today. He was not approved for TheraSkin. 9/5; patient presents for follow-up. Insurance would not cover blast X. We do have a sample and he would like to try this. We have been using Keystone antibiotic and collagen under 3 layer compression. He has no issues or complaints today. 9/18; patient presents for follow-up. We have been  using collagen with blast X and [facility] under compression therapy. He has no issues or complaints today. 9/25; patient presents for follow-up. We have continued to use collagen, blast X and Keystone under compression therapy. Patient has no issues or complaints today. 10/6; patient presents for follow-up. We have been using collagen, blast X and [facility] under compression therapy. He denies signs of infection and has no issues or complaints today. Patient has home health that comes out on Wednesdays and Fridays. 10/16; patient presents for follow-up. He has been using collagen, blast X and [facility] with benefit to the wound bed under compression therapy. He is about to run out of [name] antibiotic ointment. He has no issues or complaints today. 10/23; patient presents for follow-up. We continue to use collagen, blast X and [facility] under compression therapy. Continued report in wound healing. Home health continues to come out to do dressing changes. Patient has no issues or complaints today. 10/30; patient presents for follow-up. We have been using collagen, blast X and [facility] antibiotic under compression therapy. His wound continues to show improvement in healing. He has no issues or complaints today. 11/30; patient was last seen 1 month ago. He has missed his last clinic visit. He has home health who changes the dressings 3 times weekly. He has been using collagen with [facility] antibiotic under 3 layer compression. He has run out of [place] antibiotic ointment. The wound is almost healed. Patient  History Family History Cancer - Father, Diabetes - Mother,Father, Heart Disease - Mother,Siblings, Hypertension - Mother,Father. Social History Former smoker - quit 1970, Marital Status - Married, Drug Use - No History, Caffeine Use - Moderate - coffee. Medical History Eyes Patient has history of Cataracts Respiratory Patient has history of Sleep Apnea - has CPAP does not wear Cardiovascular Patient has history of Congestive Heart Failure, Hypertension, Myocardial Infarction - 2014 Endocrine Patient has history of Type II Diabetes Musculoskeletal Patient has history of Gout Neurologic Denies history of Seizure Disorder Hospitalization/Surgery History - 06/2021 inguinal hernia repair. - cardioversion 09/2020. - ventral hernia repair 3/19. Medical A Surgical History Notes nd Constitutional Symptoms (General Health) CVA 2014 bilateral hearing loss Gastrointestinal Diverticulosis Genitourinary Renal insufficiency 20/ Oncologic Prostate Ca AAYANSH, CODISPOTI ([id number]) [id number] Page 6 of 9 Objective Constitutional respirations regular, non-labored and within target range for patient.. Vitals Time Taken: 12:43 PM, Height: 66 in, Weight: 145 lbs, BMI: 23.4, Temperature: 97.4 F, Pulse: 44 bpm, Respiratory Rate: 20 breaths/min, Blood Pressure: 136/77 mmHg. Cardiovascular 2+ dorsalis pedis/posterior tibialis pulses. Psychiatric pleasant and cooperative. General Notes: Right lower extremity: T the anterior aspect there is granulation tissue. No signs of surrounding infection. Good edema control. o Integumentary (Hair, Skin) Wound #1 status is Open. Original cause of wound was Gradually Appeared. The date acquired was: 08/15/2021. The wound has been in treatment 23 weeks. The wound is located on the Right,Lateral Lower Leg. The wound measures 2cm length x 0.8cm width x 0.1cm depth; 1.257cm^2 area and 0.126cm^3 volume. There is Fat Layer (Subcutaneous Tissue)  exposed. There is no tunneling or undermining noted. There is a medium amount of serosanguineous drainage noted. The wound margin is distinct with the outline attached to the wound base. There is large (67-100%) red, pink granulation within the wound bed. There is a small (1- 33%) amount of necrotic tissue within the wound bed including Adherent Slough. The periwound skin appearance exhibited: Scarring. The periwound skin appearance did not exhibit: Callus, Crepitus, Excoriation, Induration, Rash, Dry/Scaly, Maceration, Atrophie Blanche, Cyanosis, Ecchymosis, Hemosiderin  Staining, Mottled, Pallor, Rubor, Erythema. Periwound temperature was noted as No Abnormality. The periwound has tenderness on palpation. 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 Type 2 diabetes mellitus with other skin ulcer Chronic combined systolic (congestive) and diastolic (congestive) heart failure Atherosclerotic heart disease of native coronary artery with unspecified angina pectoris Chronic venous hypertension (idiopathic) with ulcer of right lower extremity Patient's wound has shown significant improvement in size in appearance since last clinic visit. I recommend at this time collagen under compression therapy. I recommended he use compression stockings once the wound heals. We gave him measurements and information to order these. Follow-up in 1 week. Procedures Wound #1 Pre-procedure diagnosis of Wound #1 is a Diabetic Wound/Ulcer of the Lower Extremity located on the Right,Lateral Lower Leg . There was a Three Layer Compression Therapy Procedure by Rhae Hammock, RN. Post procedure Diagnosis Wound #1: Same as Pre-Procedure Plan Follow-up Appointments: Return Appointment in 1 week. - w/ Dr. Heber [place] Other: - ****[place] topical compounding antibiotics bring to appt each time.**** Bring in the Blastx as  well. Anesthetic: (In clinic) Topical Lidocaine 5% applied to wound bed (In clinic) Topical Lidocaine 4% applied to wound bed Cellular or Tissue Based Products: Cellular or Tissue Based Product Type: - run insurance for [employer], epicord, and organogenesis advance tissue product. Theraskin not approved. Epicord not covered. [employer] Apligraf approved. Bathing/ Shower/ Hygiene: May shower with protection but do not get wound dressing(s) wet. Edema Control - Lymphedema / SCD / Other: Elevate legs to the level of the heart or above for 30 minutes daily and/or when sitting, a frequency of: - 3-4 times a day throughout the day. Avoid standing for long periods of time. Patient to wear own compression stockings every day. - We will apply a tubi grip size E double folded today Home Health: New wound care orders this week; continue Home Health for wound care. May utilize formulary equivalent dressing for wound treatment orders unless otherwise specified. Alvis Lemmings to change 2 x a week. Only doing keystone and collagen under 3 layer. Once [place] runs out just do collagen. Other Home Health Orders/Instructions: - [facility] WOUND #1: - Lower Leg Wound Laterality: Right, Lateral Cleanser: Soap and Water [name]) 3 x Per Week/30 Days Corey [name], QUIROA ([id number]) [id number] Page 7 of 9 Discharge Instructions: May shower and wash wound with dial antibacterial soap and water prior to dressing change. Cleanser: Wound Cleanser (Home Health) 3 x Per Week/30 Days Discharge Instructions: Cleanse the wound with wound cleanser prior to applying a clean dressing using gauze sponges, not tissue or cotton balls. Topical: [facility]-topical compounding antibiotics (Home Health) 3 x Per Week/30 Days Discharge Instructions: apply directly to wound bed. Prim Dressing: Fibracol Plus Dressing, 4x4.38 in (collagen) 3 x Per Week/30 Days ary Discharge Instructions:  Moisten collagen with saline or hydrogel until the blastx and powder collagen arrive. Secondary Dressing: ABD Pad, 8x10 (Home Health) 3 x Per Week/30 Days Discharge Instructions: Apply over primary dressing as directed. Secondary Dressing: Woven Gauze Sponge, Non-Sterile 4x4 in (Home Health) 3 x Per Week/30 Days Discharge Instructions: Apply over primary dressing as directed. Com pression Wrap: ThreePress (3 layer compression wrap) 3 x Per Week/30 Days Discharge Instructions: Apply three layer compression as directed. 1. Collagen under 3 layer compression - Right lower extremity 2. Measurements and information to order compression stockings 3. Tubigrip to the left lower extremity Electronic Signature(s) Signed: 04/13/2022 4:36:48 PM  By: Kalman Shan DO Entered By: Kalman Shan on 04/13/2022 14:28:07 -------------------------------------------------------------------------------- HxROS Details Patient Name: Date of Service: Corey O NCE, [name] 04/13/2022 12:30 PM Medical Record Number: [id number] Patient Account Number: [id number] Date of Birth/Sex: Treating RN: 17-May-1945 (76 y.o. M) Primary Care Provider: Billey Gosling Other Clinician: Referring Provider: Treating Provider/Extender: [name] in Treatment: 47 Constitutional Symptoms (General Health) Medical History: Past Medical History Notes: CVA 2014 bilateral hearing loss Eyes Medical History: Positive for: Cataracts Respiratory Medical History: Positive for: Sleep Apnea - has CPAP does not wear Cardiovascular Medical History: Positive for: Congestive Heart Failure; Hypertension; Myocardial Infarction - 2014 Gastrointestinal Medical History: Past Medical History Notes: Diverticulosis Endocrine Medical History: Positive for: Type II Diabetes Time with diabetes: >20 years Treated with: Oral agents, Diet Blood sugar tested every day: Yes Tested : BID Corey [name], Corey [name] ([id number])  [id number] Page 8 of 9 Genitourinary Medical History: Past Medical History Notes: Renal insufficiency 20/ Musculoskeletal Medical History: Positive for: Gout Neurologic Medical History: Negative for: Seizure Disorder Oncologic Medical History: Past Medical History Notes: Prostate Ca HBO Extended History Items Eyes: Cataracts Immunizations Pneumococcal Vaccine: Received Pneumococcal Vaccination: No Implantable Devices No devices added Hospitalization / Surgery History Type of Hospitalization/Surgery 06/2021 inguinal hernia repair cardioversion 09/2020 ventral hernia repair 3/19 Family and Social History Cancer: Yes - Father; Diabetes: Yes - Mother,Father; Heart Disease: Yes - Mother,Siblings; Hypertension: Yes - Mother,Father; Former smoker - quit 1970; Marital Status - Married; Drug Use: No History; Caffeine Use: Moderate - coffee; Financial Concerns: No; Food, Clothing or Shelter Needs: No; Support System Lacking: No; Transportation Concerns: No Electronic Signature(s) Signed: 04/13/2022 4:36:48 PM By: Kalman Shan DO Entered By: Kalman Shan on 04/13/2022 14:18:27 -------------------------------------------------------------------------------- SuperBill Details Patient Name: Date of Service: Corey [name], [name] 04/13/2022 Medical Record Number: [id number] Patient Account Number: [id number] Date of Birth/Sex: Treating RN: 1945-09-21 (76 y.o. 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 E11.622 Type 2 diabetes mellitus with other skin ulcer I50.42 Chronic combined systolic (congestive) and diastolic (congestive) heart failure MELVIN, WHITEFORD ([id number])  [id number] Page 9 of 9 I25.119 Atherosclerotic heart disease of native coronary artery with unspecified angina pectoris I87.311 Chronic venous hypertension (idiopathic) with ulcer of right lower extremity Facility Procedures : CPT4 Code: 41324401 Description: (Facility Use Only) [phone] - [facility] RT LEG Modifier: Quantity: 1 Physician Procedures : CPT4 Code Description Modifier [id number] - WC PHYS LEVEL 3 - EST PT ICD-10 Diagnosis Description Q25.956 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

## 2022-04-13 NOTE — Telephone Encounter (Signed)
Pt's wife came into the office to drop off paperwork and pick up pt's low sodium diet information, but it was not left at the front desk for pt. Pt would like a call concerning this matter. Please address

## 2022-04-17 NOTE — Addendum Note (Signed)
Addended by: Ulice Brilliant T on: 04/17/2022 10:12 AM   Modules accepted: Orders

## 2022-04-18 ENCOUNTER — Ambulatory Visit (INDEPENDENT_AMBULATORY_CARE_PROVIDER_SITE_OTHER): Payer: Medicare HMO | Admitting: Physician Assistant

## 2022-04-18 ENCOUNTER — Ambulatory Visit (HOSPITAL_COMMUNITY)
Admission: RE | Admit: 2022-04-18 | Discharge: 2022-04-18 | Disposition: A | Discharge status: Home / Self Care | Payer: Medicare HMO | Source: Ambulatory Visit | Attending: Vascular Surgery | Admitting: Vascular Surgery

## 2022-04-18 VITALS — BP 159/89 | HR 53 | Temp 98.0°F | Resp 20 | Ht 66.0 in | Wt 174.2 lb

## 2022-04-18 DIAGNOSIS — N185 Chronic kidney disease, stage 5: Secondary | ICD-10-CM | POA: Insufficient documentation

## 2022-04-18 DIAGNOSIS — N184 Chronic kidney disease, stage 4 (severe): Secondary | ICD-10-CM

## 2022-04-18 NOTE — Progress Notes (Signed)
HALDON, CARLEY ([id number]) [id number] Page 1 of 8 Visit Report for 04/13/2022 Arrival Information Details Patient Name: Date of Service: Corey [name]. 04/13/2022 12:30 PM Medical Record Number: [id number] Patient Account Number: [id number] Date of Birth/Sex: Treating RN: 04-16-1946 (76 y.o. Mare Ferrari Primary Care Alexus Michael: Billey Gosling Other Clinician: Referring Faustina Gebert: Treating Kayron Kalmar/Extender: Mickle Asper in Treatment: 23 Visit Information History Since Last Visit Added or deleted any medications: No Patient Arrived: Ambulatory Any new allergies or adverse reactions: No Arrival Time: 12:42 Had a fall or experienced change in No Transfer Assistance: None activities of daily living that may affect Patient Identification Verified: Yes risk of falls: Secondary Verification Process Completed: Yes Signs or symptoms of abuse/neglect since last visito No Patient Requires Transmission-Based No Hospitalized since last visit: No Precautions: Implantable device outside of the clinic excluding No Patient Has Alerts: Yes cellular tissue based products placed in the center Patient Alerts: in clinic R Timpson since last visit: 11/17/2021 ABIs: both  Has Dressing in Place as Prescribed: Yes 11/17/2021 TBI: R0.64 L0.58 Has Compression in Place as Prescribed: Yes Pain Present Now: No Electronic Signature(s) Signed: 04/18/2022 3:48:14 PM By: Sharyn Creamer RN, BSN Entered By: Sharyn Creamer on 04/13/2022 12:42:51 -------------------------------------------------------------------------------- Compression Therapy Details Patient Name: Date of Service: Corey [name], [name] 04/13/2022 12:30 PM Medical Record Number: [id number] Patient Account Number: [id number] Date of Birth/Sex: Treating RN: Feb 26, 1946 (76 y.o. [name] Primary Care Dava Rensch: Billey Gosling Other Clinician: Referring Chasyn Cinque: Treating Zianna Dercole/Extender:  Mickle Asper in Treatment: 23 Compression Therapy Performed for Wound Assessment: Wound #1 Right,Lateral Lower Leg Performed By: Clinician Rhae Hammock, RN Compression Type: Three Layer Post Procedure Diagnosis Same as Pre-procedure Electronic Signature(s) Signed: 04/13/2022 5:26:06 PM By: Rhae Hammock RN Entered By: Rhae Hammock on 04/13/2022 13:04:49 [name] ([id number]) [id number] Page 2 of 8 -------------------------------------------------------------------------------- Encounter Discharge Information Details Patient Name: Date of Service: Corey [name] 04/13/2022 12:30 PM Medical Record Number: [id number] Patient Account Number: [id number] Date of Birth/Sex: Treating RN: [birth year] (76 y.o. [name] Primary Care Loghan Subia: Billey Gosling Other Clinician: Referring Demeisha Geraghty: Treating Kvon Mcilhenny/Extender: Mickle Asper in Treatment: 25 Encounter Discharge Information Items Discharge Condition: Stable Ambulatory Status: Ambulatory Discharge Destination: Home Transportation: Private Auto Accompanied By: self Schedule Follow-up Appointment: Yes Clinical Summary of Care: Patient Declined Electronic Signature(s) Signed: 04/13/2022 5:26:06 PM By: Rhae Hammock RN Entered By: Rhae Hammock on 04/13/2022 13:12:22 -------------------------------------------------------------------------------- Lower Extremity Assessment Details Patient Name: Date of Service: Corey [name], [name] 04/13/2022 12:30 PM Medical Record Number: [id number] Patient Account Number: [id number] Date of Birth/Sex: Treating RN: 07/31/1945 (76 y.o. Mare Ferrari Primary Care Raquon Milledge: Billey Gosling Other Clinician: Referring Tatiyana Foucher: Treating Nathalya Wolanski/Extender: [name] in Treatment: 23 Edema Assessment Assessed: [Left: Yes] [Right: Yes] Edema: [Left: Yes] [Right:  Yes] Calf Left: Right: Point of Measurement: 33 cm From Medial Instep 38 cm 40.7 cm Ankle Left: Right: Point of Measurement: 11 cm From Medial Instep 22 cm 22 cm Knee To Floor Left: Right: From Medial Instep 44 cm Vascular Assessment Pulses: Dorsalis Pedis Palpable: [Right:Yes] Electronic Signature(s) Signed: 04/13/2022 5:26:06 PM By: Rhae Hammock RN Signed: 04/18/2022 3:48:14 PM By: Sharyn Creamer RN, BSN Entered By: Rhae Hammock on 04/13/2022 13:11:33 [name] ([id number]) [id number] Page 3 of 8 -------------------------------------------------------------------------------- Multi Wound Chart Details Patient Name: Date of Service: Corey [name], [name] 04/13/2022 12:30 PM Medical  Record Number: [id number] Patient Account Number: [id number] Date of Birth/Sex: Treating RN: [birth year] (76 y.o. M) Primary Care Rayansh Herbst: Billey Gosling Other Clinician: Referring Gabriele Loveland: Treating Martina Brodbeck/Extender: Mickle Asper in Treatment: 23 Vital Signs Height(in): 66 Pulse(bpm): 44 Weight(lbs): 145 Blood Pressure(mmHg): 136/77 Body Mass Index(BMI): 23.4 Temperature(F): 97.4 Respiratory Rate(breaths/min): 20 [1:Photos:] [N/A:N/A] Right, Lateral Lower Leg N/A N/A Wound Location: Gradually Appeared N/A N/A Wounding Event: Diabetic Wound/Ulcer of the Lower N/A N/A Primary Etiology: Extremity Cataracts, Sleep Apnea, Congestive N/A N/A Comorbid History: Heart Failure, Hypertension, Myocardial Infarction, Type II Diabetes, Gout 08/15/2021 N/A N/A Date Acquired: 23 N/A N/A Weeks of Treatment: Open N/A N/A Wound Status: No N/A N/A Wound Recurrence: 2x0.8x0.1 N/A N/A Measurements L x W x D (cm) 1.257 N/A N/A A (cm) : rea 0.126 N/A N/A Volume (cm) : 94.20% N/A N/A % Reduction in A rea: 94.20% N/A N/A % Reduction in Volume: Grade 2 N/A N/A Classification: Medium N/A N/A Exudate A mount: Serosanguineous N/A  N/A Exudate Type: red, brown N/A N/A Exudate Color: Distinct, outline attached N/A N/A Wound Margin: Large (67-100%) N/A N/A Granulation A mount: Red, Pink N/A N/A Granulation Quality: Small (1-33%) N/A N/A Necrotic A mount: Fat Layer (Subcutaneous Tissue): Yes N/A N/A Exposed Structures: Fascia: No Tendon: No Muscle: No Joint: No Bone: No Large (67-100%) N/A N/A Epithelialization: Scarring: Yes N/A N/A Periwound Skin Texture: Excoriation: No Induration: No Callus: No Crepitus: No Rash: No Maceration: No N/A N/A Periwound Skin Moisture: Dry/Scaly: No Atrophie Blanche: No N/A N/A Periwound Skin Color: Cyanosis: No Ecchymosis: No Erythema: No Hemosiderin Staining: No Mottled: No Pallor: No Rubor: No No Abnormality N/A N/A Temperature: Yes N/A N/A Tenderness on Palpation: Compression Therapy N/A N/A Procedures Performed: MOURAD, CWIKLA ([id number]) [id number] Page 4 of 8 Treatment Notes Wound #1 (Lower Leg) Wound Laterality: Right, Lateral Cleanser Soap and Water Discharge Instruction: May shower and wash wound with dial antibacterial soap and water prior to dressing change. Wound Cleanser Discharge Instruction: Cleanse the wound with wound cleanser prior to applying a clean dressing using gauze sponges, not tissue or cotton balls. Peri-Wound Care Topical [facility]-topical compounding antibiotics Discharge Instruction: apply directly to wound bed. Primary Dressing Fibracol Plus Dressing, 4x4.38 in (collagen) Discharge Instruction: Moisten collagen with saline or hydrogel until the blastx and powder collagen arrive. Secondary Dressing ABD Pad, 8x10 Discharge Instruction: Apply over primary dressing as directed. Woven Gauze Sponge, Non-Sterile 4x4 in Discharge Instruction: Apply over primary dressing as directed. Secured With Compression Wrap ThreePress (3 layer compression wrap) Discharge Instruction: Apply three layer  compression as directed. Compression Stockings Add-Ons Electronic Signature(s) Signed: 04/13/2022 4:36:48 PM By: Kalman Shan DO Entered By: Kalman Shan on 04/13/2022 14:01:12 -------------------------------------------------------------------------------- Multi-Disciplinary Care Plan Details Patient Name: Date of Service: Corey [name], [name] 04/13/2022 12:30 PM Medical Record Number: [id number] Patient Account Number: [id number] Date of Birth/Sex: Treating RN: 13-Jul-1945 (76 y.o. [name] Primary Care Lenka Zhao: Billey Gosling Other Clinician: Referring Shone Leventhal: Treating Kerryn Tennant/Extender: Mickle Asper in Treatment: 23 Active Inactive Necrotic Tissue Nursing Diagnoses: Knowledge deficit related to management of necrotic/devitalized tissue Goals: Necrotic/devitalized tissue will be minimized in the wound bed Date Initiated: 11/01/2021 Target Resolution Date: 04/15/2022 Goal Status: Active Patient/caregiver will verbalize understanding of reason and process for debridement of necrotic tissue Date Initiated: 11/01/2021 Date Inactivated: 12/05/2021 Target Resolution Date: 12/09/2021 Goal Status: Met JERMERY, CARATACHEA ([id number]) [id number] Page 5 of 8 Interventions: Assess patient pain level pre-, during and post procedure and  prior to discharge Provide education on necrotic tissue and debridement process Treatment Activities: Apply topical anesthetic as ordered : 11/01/2021 Enzymatic debridement : 11/01/2021 Excisional debridement : 11/01/2021 Notes: Pain, Acute or Chronic Nursing Diagnoses: Pain, acute or chronic: actual or potential Potential alteration in comfort, pain Goals: Patient will verbalize adequate pain control and receive pain control interventions during procedures as needed Date Initiated: 11/01/2021 Date Inactivated: 11/21/2021 Target Resolution Date: 12/09/2021 Goal Status: Met Patient/caregiver will  verbalize comfort level met Date Initiated: 11/01/2021 Target Resolution Date: 04/15/2022 Goal Status: Active Interventions: Complete pain assessment as per visit requirements Provide education on pain management Treatment Activities: Administer pain control measures as ordered : 11/01/2021 Notes: Electronic Signature(s) Signed: 04/13/2022 5:26:06 PM By: Rhae Hammock RN Entered By: Rhae Hammock on 04/13/2022 13:00:58 -------------------------------------------------------------------------------- Pain Assessment Details Patient Name: Date of Service: Corey [name], [name] 04/13/2022 12:30 PM Medical Record Number: [id number] Patient Account Number: [id number] Date of Birth/Sex: Treating RN: 10-12-1945 (76 y.o. Mare Ferrari Primary Care Amaris Delafuente: Billey Gosling Other Clinician: Referring Tyshaun Vinzant: Treating Seven Dollens/Extender: [name] in Treatment: 23 Active Problems Location of Pain Severity and Description of Pain Patient Has Paino No Site Locations TINO, RONAN [place] ([id number]) [id number] Page 6 of 8 Pain Management and Medication Current Pain Management: Electronic Signature(s) Signed: 04/18/2022 3:48:14 PM By: Sharyn Creamer RN, BSN Entered By: Sharyn Creamer on 04/13/2022 12:49:15 -------------------------------------------------------------------------------- Patient/Caregiver Education Details Patient Name: Date of Service: Corey [name] 11/30/2023andnbsp12:30 PM Medical Record Number: [id number] Patient Account Number: [id number] Date of Birth/Gender: Treating RN: 08-05-1945 (76 y.o. [name] Primary Care Physician: Billey Gosling Other Clinician: Referring Physician: Treating Physician/Extender: Mickle Asper in Treatment: 66 Education Assessment Education Provided To: Patient Education Topics Provided Pain: Methods: Explain/Verbal Responses: Reinforcements needed, State  content correctly Electronic Signature(s) Signed: 04/13/2022 5:26:06 PM By: Rhae Hammock RN Entered By: Rhae Hammock on 04/13/2022 13:01:20 -------------------------------------------------------------------------------- Wound Assessment Details Patient Name: Date of Service: Corey [name], [name] 04/13/2022 12:30 PM Medical Record Number: [id number] Patient Account Number: [id number] Date of Birth/Sex: Treating RN: January 10, 1946 (76 y.o. Mare Ferrari Primary Care Jojo Geving: Billey Gosling Other Clinician: Referring Voncile Schwarz: Treating Paisyn Guercio/Extender: Keena, Heesch [place] ([id number]) [id number] Page 7 of 8 Weeks in Treatment: 23 Wound Status Wound Number: 1 Primary Diabetic Wound/Ulcer of the Lower Extremity Etiology: Wound Location: Right, Lateral Lower Leg Wound Open Wounding Event: Gradually Appeared Status: Date Acquired: 08/15/2021 Comorbid Cataracts, Sleep Apnea, Congestive Heart Failure, Hypertension, Weeks Of Treatment: 23 History: Myocardial Infarction, Type II Diabetes, Gout Clustered Wound: No Photos Wound Measurements Length: (cm) 2 Width: (cm) 0.8 Depth: (cm) 0.1 Area: (cm) 1.257 Volume: (cm) 0.126 % Reduction in Area: 94.2% % Reduction in Volume: 94.2% Epithelialization: Large (67-100%) Tunneling: No Undermining: No Wound Description Classification: Grade 2 Wound Margin: Distinct, outline attached Exudate Amount: Medium Exudate Type: Serosanguineous Exudate Color: red, brown Foul Odor After Cleansing: No Slough/Fibrino Yes Wound Bed Granulation Amount: Large (67-100%) Exposed Structure Granulation Quality: Red, Pink Fascia Exposed: No Necrotic Amount: Small (1-33%) Fat Layer (Subcutaneous Tissue) Exposed: Yes Necrotic Quality: Adherent Slough Tendon Exposed: No Muscle Exposed: No Joint Exposed: No Bone Exposed: No Periwound Skin Texture Texture Color No Abnormalities Noted: No No  Abnormalities Noted: No Callus: No Atrophie Blanche: No Crepitus: No Cyanosis: No Excoriation: No Ecchymosis: No Induration: No Erythema: No Rash: No Hemosiderin Staining: No Scarring: Yes Mottled: No Pallor: No Moisture Rubor: No No Abnormalities Noted: No Dry /  Scaly: No Temperature / Pain Maceration: No Temperature: No Abnormality Tenderness on Palpation: Yes Treatment Notes Wound #1 (Lower Leg) Wound Laterality: Right, Lateral Cleanser Soap and Water Discharge Instruction: May shower and wash wound with dial antibacterial soap and water prior to dressing change. Wound Cleanser Discharge Instruction: Cleanse the wound with wound cleanser prior to applying a clean dressing using gauze sponges, not tissue or cotton balls. Peri-Wound Care SIMONE, TUCKEY ([id number]) [id number] Page 8 of 8 Topical [facility]-topical compounding antibiotics Discharge Instruction: apply directly to wound bed. Primary Dressing Fibracol Plus Dressing, 4x4.38 in (collagen) Discharge Instruction: Moisten collagen with saline or hydrogel until the blastx and powder collagen arrive. Secondary Dressing ABD Pad, 8x10 Discharge Instruction: Apply over primary dressing as directed. Woven Gauze Sponge, Non-Sterile 4x4 in Discharge Instruction: Apply over primary dressing as directed. Secured With Compression Wrap ThreePress (3 layer compression wrap) Discharge Instruction: Apply three layer compression as directed. Compression Stockings Add-Ons Electronic Signature(s) Signed: 04/18/2022 3:48:14 PM By: Sharyn Creamer RN, BSN Entered By: Sharyn Creamer on 04/13/2022 12:55:37 -------------------------------------------------------------------------------- [place] Details Patient Name: Date of Service: Corey [name], [name] 04/13/2022 12:30 PM Medical Record Number: [id number] Patient Account Number: [id number] Date of Birth/Sex: Treating RN: August 03, 1945 (76 y.o. Mare Ferrari Primary Care Andreya Lacks: Billey Gosling Other Clinician: Referring Kongmeng Santoro: Treating Princes Finger/Extender: Mickle Asper in Treatment: 23 Vital Signs Time Taken: 12:43 Temperature (F): 97.4 Height (in): 66 Pulse (bpm): 44 Weight (lbs): 145 Respiratory Rate (breaths/min): 20 Body Mass Index (BMI): 23.4 Blood Pressure (mmHg): 136/77 Reference Range: 80 - 120 mg / dl Electronic Signature(s) Signed: 04/18/2022 3:48:14 PM By: Sharyn Creamer RN, BSN Entered By: Sharyn Creamer on 04/13/2022 12:49:07

## 2022-04-18 NOTE — Progress Notes (Signed)
POST OPERATIVE OFFICE NOTE    CC:  F/u for surgery  HPI:  This is a 76 y.o. male who is s/p Left brachial artery to cephalic vein AV fistula creation  on 01/31/22 by Dr. Donzetta Matters.  He is not currently on HD he is CKD stage IV.  He denies pain, weakness or loss of sensation.    Pt returns today for follow up.  Pt states he has had left hand edema and weakness for a while.    Allergies  Allergen Reactions   Hydrochlorothiazide Other (See Comments)    Gout , uncontrolled diabetes and renal insufficiency    Current Outpatient Medications  Medication Sig Dispense Refill   ACCU-CHEK AVIVA PLUS test strip TEST BLOOD SUGAR TWICE DAILY AS DIRECTED 200 strip 1   albuterol (VENTOLIN HFA) 108 (90 Base) MCG/ACT inhaler Inhale 2 puffs into the lungs every 4 (four) hours as needed for wheezing or shortness of breath. 18 g 1   allopurinol (ZYLOPRIM) 100 MG tablet Take 100 mg by mouth as needed.     apixaban (ELIQUIS) 5 MG TABS tablet Take 1 tablet (5 mg total) by mouth 2 (two) times daily. 60 tablet 6   bismuth subsalicylate (PEPTO BISMOL) 262 MG/15ML suspension Take 30 mLs by mouth every 6 (six) hours as needed for indigestion.     colchicine (COLCRYS) 0.6 MG tablet Take 2 tabs once and then one tab one hour later as needed for gout 15 tablet 2   ergocalciferol (VITAMIN D2) 1.25 MG (50000 UT) capsule Take 50,000 Units by mouth once a week. Monday     hydrALAZINE (APRESOLINE) 25 MG tablet Take 1 tablet (25 mg total) by mouth 3 (three) times daily. 90 tablet 0   rosuvastatin (CRESTOR) 5 MG tablet Take 1 tablet (5 mg total) by mouth daily. 90 tablet 3   sodium bicarbonate 650 MG tablet Take 1 tablet (650 mg total) by mouth 2 (two) times daily. 60 tablet 0   Tafamidis (VYNDAMAX) 61 MG CAPS Take 61 mg by mouth daily. 30 capsule 12   torsemide (DEMADEX) 20 MG tablet Take 1 tablet (20 mg total) by mouth daily. 90 tablet 3   TRUEplus Lancets 33G MISC USE 4 (FOUR) TIMES DAILY 400 each 2   No current  facility-administered medications for this visit.     ROS:  See HPI  Physical Exam:    Incision:  well healed  Extremities:  left UE with palpable thrill in the fistula Neuro: motor decreased in the left UE  Findings:  +--------------------+----------+-----------------+--------+  AVF                PSV (cm/s)Flow Vol (mL/min)Comments  +--------------------+----------+-----------------+--------+  Native artery inflow   163           671                 +--------------------+----------+-----------------+--------+  AVF Anastomosis        403                               +--------------------+----------+-----------------+--------+     +------------+----------+-------------+-----------+------------------------  ----+  OUTFLOW VEINPSV (cm/s)Diameter (cm)Depth (cm)           Describe             +------------+----------+-------------+-----------+------------------------  ----+  Shoulder       96        0.55        0.25                                   +------------+----------+-------------+-----------+------------------------  ----+  Prox UA        144        0.50        0.22                                   +------------+----------+-------------+-----------+------------------------  ----+  Mid UA      178 / 534     0.32        0.14    change in Diameter and  0.32                                                    dia. 0.34 cm in  length     +------------+----------+-------------+-----------+------------------------  ----+  Dist UA     251 / 566  0.59 / 0.32 0.14 / 0.20  partially-occlusive  and                                                        change in Diameter        +------------+----------+-------------+-----------+------------------------  ----+  AC Fossa       390        0.78        0.18            thrombus and                                                             partially-occlusive         +------------+----------+-------------+-----------+------------------------  ----+        Summary:  Patent arteriovenous fistula with increased velocity at the mid and distal  upper  arm at area of narrowing.  Thrombus visualized in the outflow vein in the distal upper arm and  antecubital  fossa.    Assessment/Plan:  This is a 76 y.o. male with CKD who is s/p: Left BC av fistula.  He is not on HD currently.  The fistula duplex demonstrates pending failure of the fistula with areas of partial occlusive thrombus near the distal anastomosis.     He will need a CO2 fistulogram and possible small contrast dye if intervention is done.   There is a slight possibility that he may be forced into HD sooner than later. He is on Eliquise for Afib.  This will need to be held. He has no history of prior hD access.    Roxy Horseman PA-C [facility] [phone]   Clinic MD:  Carlis Abbott

## 2022-04-18 NOTE — H&P (View-Only) (Signed)
POST OPERATIVE OFFICE NOTE    CC:  F/u for surgery  HPI:  This is a 76 y.o. male who is s/p Left brachial artery to cephalic vein AV fistula creation  on 01/31/22 by Dr. Donzetta Matters.  He is not currently on HD he is CKD stage IV.  He denies pain, weakness or loss of sensation.    Pt returns today for follow up.  Pt states he has had left hand edema and weakness for a while.    Allergies  Allergen Reactions   Hydrochlorothiazide Other (See Comments)    Gout , uncontrolled diabetes and renal insufficiency    Current Outpatient Medications  Medication Sig Dispense Refill   ACCU-CHEK AVIVA PLUS test strip TEST BLOOD SUGAR TWICE DAILY AS DIRECTED 200 strip 1   albuterol (VENTOLIN HFA) 108 (90 Base) MCG/ACT inhaler Inhale 2 puffs into the lungs every 4 (four) hours as needed for wheezing or shortness of breath. 18 g 1   allopurinol (ZYLOPRIM) 100 MG tablet Take 100 mg by mouth as needed.     apixaban (ELIQUIS) 5 MG TABS tablet Take 1 tablet (5 mg total) by mouth 2 (two) times daily. 60 tablet 6   bismuth subsalicylate (PEPTO BISMOL) 262 MG/15ML suspension Take 30 mLs by mouth every 6 (six) hours as needed for indigestion.     colchicine (COLCRYS) 0.6 MG tablet Take 2 tabs once and then one tab one hour later as needed for gout 15 tablet 2   ergocalciferol (VITAMIN D2) 1.25 MG (50000 UT) capsule Take 50,000 Units by mouth once a week. Monday     hydrALAZINE (APRESOLINE) 25 MG tablet Take 1 tablet (25 mg total) by mouth 3 (three) times daily. 90 tablet 0   rosuvastatin (CRESTOR) 5 MG tablet Take 1 tablet (5 mg total) by mouth daily. 90 tablet 3   sodium bicarbonate 650 MG tablet Take 1 tablet (650 mg total) by mouth 2 (two) times daily. 60 tablet 0   Tafamidis (VYNDAMAX) 61 MG CAPS Take 61 mg by mouth daily. 30 capsule 12   torsemide (DEMADEX) 20 MG tablet Take 1 tablet (20 mg total) by mouth daily. 90 tablet 3   TRUEplus Lancets 33G MISC USE 4 (FOUR) TIMES DAILY 400 each 2   No current  facility-administered medications for this visit.     ROS:  See HPI  Physical Exam:    Incision:  well healed  Extremities:  left UE with palpable thrill in the fistula Neuro: motor decreased in the left UE  Findings:  +--------------------+----------+-----------------+--------+  AVF                PSV (cm/s)Flow Vol (mL/min)Comments  +--------------------+----------+-----------------+--------+  Native artery inflow   163           671                 +--------------------+----------+-----------------+--------+  AVF Anastomosis        403                               +--------------------+----------+-----------------+--------+     +------------+----------+-------------+-----------+------------------------  ----+  OUTFLOW VEINPSV (cm/s)Diameter (cm)Depth (cm)           Describe             +------------+----------+-------------+-----------+------------------------  ----+  Shoulder       96        0.55        0.25                                   +------------+----------+-------------+-----------+------------------------  ----+  Prox UA        144        0.50        0.22                                   +------------+----------+-------------+-----------+------------------------  ----+  Mid UA      178 / 534     0.32        0.14    change in Diameter and  0.32                                                    dia. 0.34 cm in  length     +------------+----------+-------------+-----------+------------------------  ----+  Dist UA     251 / 566  0.59 / 0.32 0.14 / 0.20  partially-occlusive  and                                                        change in Diameter        +------------+----------+-------------+-----------+------------------------  ----+  AC Fossa       390        0.78        0.18            thrombus and                                                             partially-occlusive         +------------+----------+-------------+-----------+------------------------  ----+        Summary:  Patent arteriovenous fistula with increased velocity at the mid and distal  upper  arm at area of narrowing.  Thrombus visualized in the outflow vein in the distal upper arm and  antecubital  fossa.    Assessment/Plan:  This is a 76 y.o. male with CKD who is s/p: Left BC av fistula.  He is not on HD currently.  The fistula duplex demonstrates pending failure of the fistula with areas of partial occlusive thrombus near the distal anastomosis.     He will need a CO2 fistulogram and possible small contrast dye if intervention is done.   There is a slight possibility that he may be forced into HD sooner than later. He is on Eliquise for Afib.  This will need to be held. He has no history of prior hD access.    Roxy Horseman PA-C [facility] [phone]   Clinic MD:  Carlis Abbott

## 2022-04-19 ENCOUNTER — Other Ambulatory Visit: Payer: Medicare HMO

## 2022-04-19 ENCOUNTER — Telehealth: Payer: Self-pay | Admitting: Internal Medicine

## 2022-04-19 NOTE — Telephone Encounter (Signed)
Spoke with patient's wife today.  Script was sent in by cardiology on 04/13/22 and wife has been instructed to pick up script from [place].

## 2022-04-19 NOTE — Telephone Encounter (Signed)
Caller & Relationship to patient: Wife  Call back number: [phone]   Date of last office visit: 11.27.23    Date of next office visit: N/A  Medication(s) to be refilled:  rosuvastatin (CRESTOR) 5 MG tablet   Preferred Pharmacy:   [facility]   Phone: [phone]  Fax: [phone]    Pt was given RX while at [facility], wife called them for a refill and they told her to make the request through pt's pcp

## 2022-04-20 DIAGNOSIS — I5042 Chronic combined systolic (congestive) and diastolic (congestive) heart failure: Secondary | ICD-10-CM | POA: Diagnosis not present

## 2022-04-20 DIAGNOSIS — E8581 Light chain (AL) amyloidosis: Secondary | ICD-10-CM | POA: Diagnosis not present

## 2022-04-20 DIAGNOSIS — I130 Hypertensive heart and chronic kidney disease with heart failure and stage 1 through stage 4 chronic kidney disease, or unspecified chronic kidney disease: Secondary | ICD-10-CM | POA: Diagnosis not present

## 2022-04-20 DIAGNOSIS — N179 Acute kidney failure, unspecified: Secondary | ICD-10-CM | POA: Diagnosis not present

## 2022-04-20 DIAGNOSIS — I770 Arteriovenous fistula, acquired: Secondary | ICD-10-CM | POA: Diagnosis not present

## 2022-04-20 DIAGNOSIS — E872 Acidosis, unspecified: Secondary | ICD-10-CM | POA: Diagnosis not present

## 2022-04-21 ENCOUNTER — Telehealth: Payer: Self-pay

## 2022-04-21 ENCOUNTER — Other Ambulatory Visit (HOSPITAL_COMMUNITY): Payer: Self-pay

## 2022-04-21 NOTE — Telephone Encounter (Signed)
Attempted to reach patient to schedule fistulogram, no answer. Voicemail full.

## 2022-04-24 ENCOUNTER — Other Ambulatory Visit (HOSPITAL_COMMUNITY): Payer: Self-pay

## 2022-04-25 ENCOUNTER — Telehealth: Payer: Self-pay | Admitting: Cardiology

## 2022-04-25 ENCOUNTER — Other Ambulatory Visit: Payer: Self-pay

## 2022-04-25 ENCOUNTER — Other Ambulatory Visit (HOSPITAL_COMMUNITY): Payer: Self-pay

## 2022-04-25 DIAGNOSIS — T82590A Other mechanical complication of surgically created arteriovenous fistula, initial encounter: Secondary | ICD-10-CM

## 2022-04-25 NOTE — Telephone Encounter (Signed)
Pt c/o swelling: STAT is pt has developed SOB within 24 hours  How much weight have you gained and in what time span? 5lbs since yesterday   If swelling, where is the swelling located? Right left little swelling  Are you currently taking a fluid pill? yes  Are you currently SOB? Some wheezing but lungs sounded clear  Do you have a log of your daily weights (if so, list)?   Have you gained 3 pounds in a day or 5 pounds in a week? Yes in day   Have you traveled recently? no .

## 2022-04-25 NOTE — Telephone Encounter (Signed)
Spoke with [facility] physical therapist who called to report a weight gain of 3 lbs gain in one day. Patient is not complaining of SOB and reports cloths do not feel tight. Difficult to assess swelling in right let/foot due to UNA boot.   Spoke with wife who states patient weighed after showering and getting dressed. She also states patient has not complaints. Advised to take extra dose of torsemide '20mg'$  today and tomorrow. Home care nurse is visiting tomorrow and can reassess weight and symptoms.    Advised wife to call EMS if patient developed worsening symptoms, additional weight gain, SOB and or chest pain. Will notify [facility] of today's call for follow up.

## 2022-04-26 ENCOUNTER — Telehealth: Payer: Self-pay | Admitting: Internal Medicine

## 2022-04-26 ENCOUNTER — Other Ambulatory Visit: Payer: Self-pay

## 2022-04-26 ENCOUNTER — Other Ambulatory Visit (HOSPITAL_COMMUNITY): Payer: Self-pay

## 2022-04-26 DIAGNOSIS — E1122 Type 2 diabetes mellitus with diabetic chronic kidney disease: Secondary | ICD-10-CM

## 2022-04-26 DIAGNOSIS — G3184 Mild cognitive impairment, so stated: Secondary | ICD-10-CM

## 2022-04-26 DIAGNOSIS — E854 Organ-limited amyloidosis: Secondary | ICD-10-CM

## 2022-04-26 DIAGNOSIS — I509 Heart failure, unspecified: Secondary | ICD-10-CM

## 2022-04-26 DIAGNOSIS — C61 Malignant neoplasm of prostate: Secondary | ICD-10-CM

## 2022-04-26 DIAGNOSIS — N184 Chronic kidney disease, stage 4 (severe): Secondary | ICD-10-CM

## 2022-04-26 MED ORDER — SODIUM BICARBONATE 650 MG PO TABS: 650.0000 mg | ORAL_TABLET | Freq: Two times a day (BID) | ORAL | 0 refills | Status: DC

## 2022-04-26 NOTE — Telephone Encounter (Signed)
Glenard Haring an Therapist, sports from [place] called on behalf of the patient says she needs re-certification of skilled nursing 2 weeks 4. She would also like a palliative care referral put in for the patient. Would also like a Education officer, museum evaluation order put in for transportation and a speech therapy evaluation put in for his memory.

## 2022-04-26 NOTE — Telephone Encounter (Signed)
Patients wife called stating patient needs a refill on his sodium bicarbonate '650mg'$ . They would like it sent to the [facility] on [address].

## 2022-04-26 NOTE — Telephone Encounter (Signed)
Sent in for patient today

## 2022-04-27 NOTE — Telephone Encounter (Signed)
Okay for verbal orders for recertification of skilled nursing.  Referral [facility] palliative care ordered  Referral for social work ordered for transportation  Referral for speech therapy ordered for mild cognitive impairment

## 2022-04-27 NOTE — Telephone Encounter (Signed)
Unable to provide verbals at this current time because no phone number given to call nurse back.

## 2022-04-28 ENCOUNTER — Encounter (HOSPITAL_COMMUNITY)
Admission: RE | Disposition: A | Discharge status: Home / Self Care | Payer: Self-pay | Source: Ambulatory Visit | Attending: Vascular Surgery

## 2022-04-28 ENCOUNTER — Ambulatory Visit (HOSPITAL_COMMUNITY)
Admission: RE | Admit: 2022-04-28 | Discharge: 2022-04-28 | Disposition: A | Discharge status: Home / Self Care | Payer: Medicare HMO | Source: Ambulatory Visit | Attending: Vascular Surgery | Admitting: Vascular Surgery

## 2022-04-28 ENCOUNTER — Other Ambulatory Visit: Payer: Self-pay

## 2022-04-28 DIAGNOSIS — N184 Chronic kidney disease, stage 4 (severe): Secondary | ICD-10-CM | POA: Diagnosis not present

## 2022-04-28 DIAGNOSIS — I4891 Unspecified atrial fibrillation: Secondary | ICD-10-CM | POA: Insufficient documentation

## 2022-04-28 DIAGNOSIS — Y832 Surgical operation with anastomosis, bypass or graft as the cause of abnormal reaction of the patient, or of later complication, without mention of misadventure at the time of the procedure: Secondary | ICD-10-CM | POA: Insufficient documentation

## 2022-04-28 DIAGNOSIS — N186 End stage renal disease: Secondary | ICD-10-CM | POA: Diagnosis present

## 2022-04-28 DIAGNOSIS — Z7901 Long term (current) use of anticoagulants: Secondary | ICD-10-CM | POA: Diagnosis not present

## 2022-04-28 DIAGNOSIS — T82590A Other mechanical complication of surgically created arteriovenous fistula, initial encounter: Secondary | ICD-10-CM

## 2022-04-28 DIAGNOSIS — T82858A Stenosis of vascular prosthetic devices, implants and grafts, initial encounter: Secondary | ICD-10-CM | POA: Insufficient documentation

## 2022-04-28 HISTORY — PX: PERIPHERAL VASCULAR BALLOON ANGIOPLASTY: CATH118281

## 2022-04-28 HISTORY — PX: A/V FISTULAGRAM: CATH118298

## 2022-04-28 LAB — POCT I-STAT, CHEM 8
BUN: 88 mg/dL — ABNORMAL HIGH (ref 8–23)
Calcium, Ion: 1.2 mmol/L (ref 1.15–1.40)
Chloride: 109 mmol/L (ref 98–111)
Creatinine, Ser: 3.7 mg/dL — ABNORMAL HIGH (ref 0.61–1.24)
Glucose, Bld: 79 mg/dL (ref 70–99)
HCT: 35 % — ABNORMAL LOW (ref 39.0–52.0)
Hemoglobin: 11.9 g/dL — ABNORMAL LOW (ref 13.0–17.0)
Potassium: 4.7 mmol/L (ref 3.5–5.1)
Sodium: 142 mmol/L (ref 135–145)
TCO2: 26 mmol/L (ref 22–32)

## 2022-04-28 SURGERY — A/V FISTULAGRAM
Anesthesia: LOCAL | Laterality: Left

## 2022-04-28 MED ORDER — LIDOCAINE HCL (PF) 1 % IJ SOLN
INTRAMUSCULAR | Status: DC | PRN
  Administered 2022-04-28: 2 mL

## 2022-04-28 MED ORDER — MIDAZOLAM HCL 2 MG/2ML IJ SOLN
INTRAMUSCULAR | Status: DC | PRN
  Administered 2022-04-28 (×2): 1 mg via INTRAVENOUS

## 2022-04-28 MED ORDER — FENTANYL CITRATE (PF) 100 MCG/2ML IJ SOLN
INTRAMUSCULAR | Status: AC
  Filled 2022-04-28: qty 2

## 2022-04-28 MED ORDER — MIDAZOLAM HCL 2 MG/2ML IJ SOLN
INTRAMUSCULAR | Status: AC
  Filled 2022-04-28: qty 2

## 2022-04-28 MED ORDER — LIDOCAINE HCL (PF) 1 % IJ SOLN
INTRAMUSCULAR | Status: AC
  Filled 2022-04-28: qty 30

## 2022-04-28 MED ORDER — HEPARIN (PORCINE) IN NACL 1000-0.9 UT/500ML-% IV SOLN
INTRAVENOUS | Status: AC
  Filled 2022-04-28: qty 500

## 2022-04-28 MED ORDER — IODIXANOL 320 MG/ML IV SOLN
INTRAVENOUS | Status: DC | PRN
  Administered 2022-04-28: 25 mL

## 2022-04-28 MED ORDER — FENTANYL CITRATE (PF) 100 MCG/2ML IJ SOLN
INTRAMUSCULAR | Status: DC | PRN
  Administered 2022-04-28 (×2): 25 ug via INTRAVENOUS

## 2022-04-28 MED ORDER — HEPARIN (PORCINE) IN NACL 1000-0.9 UT/500ML-% IV SOLN
INTRAVENOUS | Status: DC | PRN
  Administered 2022-04-28: 500 mL

## 2022-04-28 SURGICAL SUPPLY — 20 items
BAG SNAP BAND KOVER 36X36 (MISCELLANEOUS) ×3 IMPLANT
BALLN MUSTANG 4.0X40 75 (BALLOONS) ×2
BALLOON MUSTANG 4.0X40 75 (BALLOONS) IMPLANT
CATH ANGIO 5F BER2 65CM (CATHETERS) IMPLANT
COVER DOME SNAP 22 D (MISCELLANEOUS) ×3 IMPLANT
DCB RANGER 6.0X150 150 (BALLOONS) IMPLANT
DEVICE TORQUE .025-.038 (MISCELLANEOUS) IMPLANT
GUIDEWIRE ANGLED .035X150CM (WIRE) IMPLANT
KIT ENCORE 26 ADVANTAGE (KITS) IMPLANT
KIT MICROPUNCTURE NIT STIFF (SHEATH) IMPLANT
PROTECTION STATION PRESSURIZED (MISCELLANEOUS) ×2
RANGER DCB 6.0X150 150 (BALLOONS) ×2
SHEATH PINNACLE R/O II 6F 4CM (SHEATH) IMPLANT
SHEATH PROBE COVER 6X72 (BAG) ×3 IMPLANT
STATION PROTECTION PRESSURIZED (MISCELLANEOUS) ×3 IMPLANT
STOPCOCK MORSE 400PSI 3WAY (MISCELLANEOUS) ×3 IMPLANT
TRAY PV CATH (CUSTOM PROCEDURE TRAY) ×3 IMPLANT
TUBING CIL FLEX 10 FLL-RA (TUBING) ×3 IMPLANT
WIRE BENTSON .035X145CM (WIRE) IMPLANT
WIRE G V18X300CM (WIRE) IMPLANT

## 2022-04-28 NOTE — Interval H&P Note (Signed)
History and Physical Interval Note:  04/28/2022 11:23 AM  Corey [name]  has presented today for surgery, with the diagnosis of malfunction of AV fistula.  The various methods of treatment have been discussed with the patient and family. After consideration of risks, benefits and other options for treatment, the patient has consented to  Procedure(s): A/V Fistulagram (Left) as a surgical intervention.  The patient's history has been reviewed, patient examined, no change in status, stable for surgery.  I have reviewed the patient's chart and labs.  Questions were answered to the patient's satisfaction.     [name]

## 2022-04-28 NOTE — Op Note (Signed)
   PATIENT: Corey [name]      MRN: [id number] DOB: 02/16/1946    DATE OF PROCEDURE: 04/28/2022  INDICATIONS:    Corey [name] is a 76 y.o. male who had a left brachiocephalic fistula placed on 01/31/2022.  He was seen in the office by Gerri Lins, PA and was noted to have some areas of stenosis within the fistula and also some thrombus in the proximal fistula.  He was set up for a fistulogram.  PROCEDURE:    Ultrasound-guided access to left AV fistula Fistulogram left AV fistula Drug-coated balloon angioplasty of left AV fistula  SURGEON: Judeth Cornfield. Scot Dock, MD, FACS  ANESTHESIA: Local with sedation  EBL: Minimal  TECHNIQUE: The patient was brought to the peripheral vascular lab and was sedated. The period of conscious sedation was 48 minutes.  During that time period, I was present face-to-face 100% of the time.  The patient was administered 1 mg of Versed and 50 mcg of fentanyl. The patient's heart rate, blood pressure, and oxygen saturation were monitored by the nurse continuously during the procedure.  The left arm was prepped and draped in usual sterile fashion.  Under ultrasound guidance I cannulated the fistula with a micropuncture needle and a micropuncture sheath was introduced over the wire.  A fistulogram was then obtained.  This demonstrated multiple areas of irregularity in the proximal fistula.  I was able to use a Berenstein catheter and an angled Glidewire to manipulate the wire down ultimately through the arterial anastomosis.  I elected to address the areas of irregularity with balloon angioplasty.  I predilated with a 4 mm balloon initially and then went back with a 6 x 150 drug-coated balloon which was inflated to nominal pressure for 3 minutes.  Completion films showed an excellent result.  A 4-0 Monocryl was used to close the fistula site.  The patient tolerated the procedure well.  He will follow-up in 6 months for a follow-up duplex.   FINDINGS:   Patent left  upper arm fistula. Long segment of irregularity in the proximal fistula and adjacent to the arterial anastomosis. This was addressed with drug-coated balloon angioplasty with minimal residual stenosis.  CLINICAL NOTE: The patient is not on dialysis.  He will return in 6 6 weeks for follow-up duplex scan of his fistula.  [name], MD, FACS [facility]  DATE OF DICTATION:   04/28/2022

## 2022-05-01 ENCOUNTER — Encounter (HOSPITAL_COMMUNITY): Payer: Self-pay | Admitting: Vascular Surgery

## 2022-05-01 ENCOUNTER — Encounter (HOSPITAL_BASED_OUTPATIENT_CLINIC_OR_DEPARTMENT_OTHER): Payer: Medicare HMO | Attending: Internal Medicine | Admitting: Internal Medicine

## 2022-05-01 ENCOUNTER — Telehealth: Payer: Self-pay | Admitting: *Deleted

## 2022-05-01 DIAGNOSIS — L97918 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 with other specified severity: Secondary | ICD-10-CM | POA: Diagnosis not present

## 2022-05-01 DIAGNOSIS — E11622 Type 2 diabetes mellitus with other skin ulcer: Secondary | ICD-10-CM | POA: Insufficient documentation

## 2022-05-01 DIAGNOSIS — I130 Hypertensive heart and chronic kidney disease with heart failure and stage 1 through stage 4 chronic kidney disease, or unspecified chronic kidney disease: Secondary | ICD-10-CM | POA: Diagnosis not present

## 2022-05-01 DIAGNOSIS — Z87891 Personal history of nicotine dependence: Secondary | ICD-10-CM | POA: Diagnosis not present

## 2022-05-01 DIAGNOSIS — I5042 Chronic combined systolic (congestive) and diastolic (congestive) heart failure: Secondary | ICD-10-CM | POA: Diagnosis not present

## 2022-05-01 DIAGNOSIS — N184 Chronic kidney disease, stage 4 (severe): Secondary | ICD-10-CM | POA: Diagnosis not present

## 2022-05-01 DIAGNOSIS — E1122 Type 2 diabetes mellitus with diabetic chronic kidney disease: Secondary | ICD-10-CM | POA: Diagnosis not present

## 2022-05-01 NOTE — Telephone Encounter (Signed)
   Telephone encounter was:  Successful.  05/01/2022 Name: WILMA WUTHRICH MRN: [id number] DOB: [birth year]  UKIAH TRAWICK is a 76 y.o. year old male who is a primary care patient of Burns, [name], MD . The community resource team was consulted for assistance with Transportation Needs   Care guide performed the following interventions: Patient provided with information about care guide support team and interviewed to confirm resource needs. Patient wife has appts covered for rest of this year , patient ready to start dialysis soon will reach back out for assitance when needed through concierge line if there is any short notice visits needed also provided [place] access application  Follow Up Plan:  No further follow up planned at this time. The patient has been provided with needed resources.  [facility] [phone] [address] [place] , [place] [address] Email : [email or web address]

## 2022-05-02 ENCOUNTER — Telehealth: Payer: Self-pay | Admitting: Cardiology

## 2022-05-02 ENCOUNTER — Encounter (HOSPITAL_COMMUNITY): Payer: Self-pay | Admitting: Vascular Surgery

## 2022-05-02 NOTE — Telephone Encounter (Signed)
Pt c/o swelling: STAT is pt has developed SOB within 24 hours  How much weight have you gained and in what time span? 3 lbs in day   If swelling, where is the swelling located? legs  Are you currently taking a fluid pill? Yes 3x weekly  Are you currently SOB? Yes, started this morning    Do you have a log of your daily weights (if so, list)? N/A   Have you gained 3 pounds in a day or 5 pounds in a week? Yes  Have you traveled recently? No.

## 2022-05-02 NOTE — Telephone Encounter (Signed)
Patient states he has pedal/ankle/leg swelling-- right leg.  Noticed yesterday.  Same this morning after laying down and elevating leg overnight.  No pain or tenderness.  Not hot or cold to touch.  No tingling or numbness.  Ambulating on it normally.  Takes torsemide M, W, F and has not missed any doses. Denies eating any extra salt or salty foods.    Starting today he is short of breath with ambulating around the house.  Cannot hear wheezing. Urinating like normal.  Per HF clinic note 11/22 - pt approaching ESRD.  Most recent creatinine (I-STAT) 04/28/22 - 3.70, 1 mo ago=5.31.  Does not feel heart beating fast.  BP today: 120/88.  No heart rate available.  He is scheduled for atrial flutter ablation with Dr. Myles Gip 06/23/22.  Pt was seen in [facility] last month.  Will route to their triage pool to address further considering his kidney function.  Torsemide order is for 20 mg daily, but patient only takes 3 times a week.  Last instructions by HF were take x 2 days then PRN.  Patient and wife wish for call back to to be to his wife, [name] at [phone]

## 2022-05-04 ENCOUNTER — Ambulatory Visit
Admission: RE | Admit: 2022-05-04 | Discharge: 2022-05-04 | Disposition: A | Discharge status: Home / Self Care | Payer: Medicare HMO | Source: Ambulatory Visit | Attending: Internal Medicine | Admitting: Internal Medicine

## 2022-05-04 ENCOUNTER — Telehealth: Payer: Self-pay | Admitting: Cardiology

## 2022-05-04 DIAGNOSIS — M5136 Other intervertebral disc degeneration, lumbar region: Secondary | ICD-10-CM | POA: Diagnosis not present

## 2022-05-04 DIAGNOSIS — R937 Abnormal findings on diagnostic imaging of other parts of musculoskeletal system: Secondary | ICD-10-CM

## 2022-05-04 DIAGNOSIS — M4316 Spondylolisthesis, lumbar region: Secondary | ICD-10-CM | POA: Diagnosis not present

## 2022-05-04 DIAGNOSIS — M545 Low back pain, unspecified: Secondary | ICD-10-CM

## 2022-05-04 NOTE — Progress Notes (Signed)
THIERRY, DOBOSZ ([id number]) [id number] Page 1 of 8 Visit Report for 05/01/2022 Arrival Information Details Patient Name: Date of Service: Corey [name]. 05/01/2022 10:15 A M Medical Record Number: [id number] Patient Account Number: [id number] Date of Birth/Sex: Treating RN: [birth year] (76 y.o. M) Primary Care Mariaceleste Herrera: Billey Gosling Other Clinician: Referring Dejane Scheibe: Treating Tyrelle Raczka/Extender: Mickle Asper in Treatment: 25 Visit Information History Since Last Visit Added or deleted any medications: No Patient Arrived: Ambulatory Any new allergies or adverse reactions: No Arrival Time: 10:19 Had a fall or experienced change in No Accompanied By: wife activities of daily living that may affect Transfer Assistance: None risk of falls: Patient Identification Verified: Yes Signs or symptoms of abuse/neglect since last visito No Secondary Verification Process Completed: Yes Hospitalized since last visit: No Patient Requires Transmission-Based No Implantable device outside of the clinic excluding No Precautions: cellular tissue based products placed in the center Patient Has Alerts: Yes since last visit: Patient Alerts: in clinic R Hud[place]eth Has Compression in Place as Prescribed: Yes 11/17/2021 ABIs: both [place] Pain Present Now: No 11/17/2021 TBI: R0.64 L0.58 Electronic Signature(s) Signed: 05/01/2022 4:19:27 PM By: Erenest Blank Entered By: Erenest Blank on 05/01/2022 10:21:01 -------------------------------------------------------------------------------- Compression Therapy Details Patient Name: Date of Service: [name], [name] 05/01/2022 10:15 A M Medical Record Number: [id number] Patient Account Number: [id number] Date of Birth/Sex: Treating RN: 04-10-1946 (76 y.o. [name] Primary Care Dawana Asper: Billey Gosling Other Clinician: Referring Inesha Sow: Treating Catherin Doorn/Extender: [name] in  Treatment: 25 Compression Therapy Performed for Wound Assessment: Wound #1 Right,Lateral Lower Leg Performed By: Clinician Rhae Hammock, RN Compression Type: Three Layer Post Procedure Diagnosis Same as Pre-procedure Electronic Signature(s) Signed: 05/03/2022 5:39:23 PM By: Rhae Hammock RN Entered By: Rhae Hammock on 05/01/2022 10:45:54 [name] ([id number]) [id number] Page 2 of 8 -------------------------------------------------------------------------------- Encounter Discharge Information Details Patient Name: Date of Service: Corey [name]. 05/01/2022 10:15 A M Medical Record Number: [id number] Patient Account Number: [id number] Date of Birth/Sex: Treating RN: [birth year] (76 y.o. [name] Primary Care Nurah Petrides: Billey Gosling Other Clinician: Referring Carson Meche: Treating Rik Wadel/Extender: Mickle Asper in Treatment: 25 Encounter Discharge Information Items Post Procedure Vitals Discharge Condition: Stable Temperature (F): 98.7 Ambulatory Status: Ambulatory Pulse (bpm): 74 Discharge Destination: Home Respiratory Rate (breaths/min): 17 Transportation: Private Auto Blood Pressure (mmHg): 120/80 Accompanied By: wife Schedule Follow-up Appointment: Yes Clinical Summary of Care: Patient Declined Electronic Signature(s) Signed: 05/03/2022 5:39:23 PM By: Rhae Hammock RN Entered By: Rhae Hammock on 05/01/2022 10:52:43 -------------------------------------------------------------------------------- Lower Extremity Assessment Details Patient Name: Date of Service: [name], [name] 05/01/2022 10:15 A M Medical Record Number: [id number] Patient Account Number: [id number] Date of Birth/Sex: Treating RN: 09/14/1945 (76 y.o. M) Primary Care Monte Bronder: Billey Gosling Other Clinician: Referring Mendell Bontempo: Treating Pheobe Sandiford/Extender: [name] in Treatment: 25 Edema  Assessment Assessed: [Left: No] [Right: No] Edema: [Left: Ye] [Right: s] Calf Left: Right: Point of Measurement: 33 cm From Medial Instep 41 cm Ankle Left: Right: Point of Measurement: 11 cm From Medial Instep 21 cm Electronic Signature(s) Signed: 05/01/2022 4:19:27 PM By: Erenest Blank Entered By: Erenest Blank on 05/01/2022 10:32:29 -------------------------------------------------------------------------------- Multi Wound Chart Details Patient Name: Date of Service: [name], [name] 05/01/2022 10:15 A M Medical Record Number: [id number] Patient Account Number: [id number] Date of Birth/Sex: Treating RN: Dec 16, 1945 (76 y.o. M) Primary Care Yariela Tison: Billey Gosling Other Clinician: Referring Evanthia Maund: Treating Malillany Kazlauskas/Extender: [place]  Nelwyn Salisbury, Stacy Weeks in Treatment: [address] [place] ([id number]) [id number] Page 3 of 8 Vital Signs Height(in): 66 Pulse(bpm): 57 Weight(lbs): 145 Blood Pressure(mmHg): 135/80 Body Mass Index(BMI): 23.4 Temperature(F): 97.7 Respiratory Rate(breaths/min): 18 [1:Photos:] [N/A:N/A] Right, Lateral Lower Leg N/A N/A Wound Location: Gradually Appeared N/A N/A Wounding Event: Diabetic Wound/Ulcer of the Lower N/A N/A Primary Etiology: Extremity Cataracts, Sleep Apnea, Congestive N/A N/A Comorbid History: Heart Failure, Hypertension, Myocardial Infarction, Type II Diabetes, Gout 08/15/2021 N/A N/A Date Acquired: 25 N/A N/A Weeks of Treatment: Open N/A N/A Wound Status: No N/A N/A Wound Recurrence: 1.1x0.9x0.1 N/A N/A Measurements L x W x D (cm) 0.778 N/A N/A A (cm) : rea 0.078 N/A N/A Volume (cm) : 96.40% N/A N/A % Reduction in A rea: 96.40% N/A N/A % Reduction in Volume: Grade 2 N/A N/A Classification: Medium N/A N/A Exudate A mount: Serosanguineous N/A N/A Exudate Type: red, brown N/A N/A Exudate Color: Distinct, outline attached N/A N/A Wound Margin: Large (67-100%) N/A  N/A Granulation A mount: Red, Pink N/A N/A Granulation Quality: None Present (0%) N/A N/A Necrotic A mount: Fat Layer (Subcutaneous Tissue): Yes N/A N/A Exposed Structures: Fascia: No Tendon: No Muscle: No Joint: No Bone: No Large (67-100%) N/A N/A Epithelialization: Debridement - Excisional N/A N/A Debridement: Pre-procedure Verification/Time Out 10:45 N/A N/A Taken: Lidocaine N/A N/A Pain Control: Subcutaneous, Slough N/A N/A Tissue Debrided: Skin/Subcutaneous Tissue N/A N/A Level: 0.99 N/A N/A Debridement A (sq cm): rea Curette N/A N/A Instrument: Minimum N/A N/A Bleeding: Pressure N/A N/A Hemostasis A chieved: 0 N/A N/A Procedural Pain: 0 N/A N/A Post Procedural Pain: Procedure was tolerated well N/A N/A Debridement Treatment Response: 1.1x0.9x0.1 N/A N/A Post Debridement Measurements L x W x D (cm) 0.078 N/A N/A Post Debridement Volume: (cm) Scarring: Yes N/A N/A Periwound Skin Texture: Excoriation: No Induration: No Callus: No Crepitus: No Rash: No Maceration: No N/A N/A Periwound Skin Moisture: Dry/Scaly: No Atrophie Blanche: No N/A N/A Periwound Skin Color: Cyanosis: No Ecchymosis: No Erythema: No Hemosiderin Staining: No Mottled: No Pallor: No Rubor: No No Abnormality N/A N/A TemperatureROSHON, DUELL ([id number]) [id number] Page 4 of 8 Yes N/A N/A Tenderness on Palpation: Compression Therapy N/A N/A Procedures Performed: Debridement Treatment Notes Wound #1 (Lower Leg) Wound Laterality: Right, Lateral Cleanser Soap and Water Discharge Instruction: May shower and wash wound with dial antibacterial soap and water prior to dressing change. Wound Cleanser Discharge Instruction: Cleanse the wound with wound cleanser prior to applying a clean dressing using gauze sponges, not tissue or cotton balls. Peri-Wound Care Topical Primary Dressing Fibracol Plus Dressing, 4x4.38 in (collagen) Discharge  Instruction: Moisten collagen with saline or hydrogel until the blastx and powder collagen arrive. Secondary Dressing ABD Pad, 8x10 Discharge Instruction: Apply over primary dressing as directed. Woven Gauze Sponge, Non-Sterile 4x4 in Discharge Instruction: Apply over primary dressing as directed. Secured With Compression Wrap ThreePress (3 layer compression wrap) Discharge Instruction: Apply three layer compression as directed. Compression Stockings Add-Ons Electronic Signature(s) Signed: 05/01/2022 11:20:58 AM By: Kalman Shan DO Entered By: Kalman Shan on 05/01/2022 10:58:49 -------------------------------------------------------------------------------- Multi-Disciplinary Care Plan Details Patient Name: Date of Service: [name], [name] 05/01/2022 10:15 A M Medical Record Number: [id number] Patient Account Number: [id number] Date of Birth/Sex: Treating RN: 1945/09/25 (76 y.o. [name] Primary Care Simona Rocque: Billey Gosling Other Clinician: Referring Joeann Steppe: Treating Valeria Krisko/Extender: Mickle Asper in Treatment: 25 Active Inactive Necrotic Tissue Nursing Diagnoses: Knowledge deficit related to management of necrotic/devitalized tissue Goals: Necrotic/devitalized tissue  will be minimized in the wound bed Date Initiated: 11/01/2021 Target Resolution Date: 05/13/2022 Goal Status: Active Patient/caregiver will verbalize understanding of reason and process for debridement of necrotic tissue Date Initiated: 11/01/2021 Date Inactivated: 12/05/2021 Target Resolution Date: 12/09/2021 Corey [name], Corey [name] ([id number]) [phone].pdf Page 5 of 8 Goal Status: Met Interventions: Assess patient pain level pre-, during and post procedure and prior to discharge Provide education on necrotic tissue and debridement process Treatment Activities: Apply topical anesthetic as ordered : 11/01/2021 Enzymatic debridement :  11/01/2021 Excisional debridement : 11/01/2021 Notes: Pain, Acute or Chronic Nursing Diagnoses: Pain, acute or chronic: actual or potential Potential alteration in comfort, pain Goals: Patient will verbalize adequate pain control and receive pain control interventions during procedures as needed Date Initiated: 11/01/2021 Date Inactivated: 11/21/2021 Target Resolution Date: 12/09/2021 Goal Status: Met Patient/caregiver will verbalize comfort level met Date Initiated: 11/01/2021 Target Resolution Date: 05/13/2022 Goal Status: Active Interventions: Complete pain assessment as per visit requirements Provide education on pain management Treatment Activities: Administer pain control measures as ordered : 11/01/2021 Notes: Electronic Signature(s) Signed: 05/03/2022 5:39:23 PM By: Rhae Hammock RN Entered By: Rhae Hammock on 05/01/2022 10:51:15 -------------------------------------------------------------------------------- Pain Assessment Details Patient Name: Date of Service: [name], [name] 05/01/2022 10:15 A M Medical Record Number: [id number] Patient Account Number: [id number] Date of Birth/Sex: Treating RN: 1945-08-07 (76 y.o. M) Primary Care Topacio Cella: Billey Gosling Other Clinician: Referring Inioluwa Baris: Treating Advik Weatherspoon/Extender: Mickle Asper in Treatment: 25 Active Problems Location of Pain Severity and Description of Pain Patient Has Paino No Site Locations [place] ([id number]) [id number] Page 6 of 8 Pain Management and Medication Current Pain Management: Electronic Signature(s) Signed: 05/01/2022 4:19:27 PM By: Erenest Blank Entered By: Erenest Blank on 05/01/2022 10:21:24 -------------------------------------------------------------------------------- Patient/Caregiver Education Details Patient Name: Date of Service: [name], Tor Netters 12/18/2023andnbsp10:15 A M Medical Record Number: [id number] Patient  Account Number: [id number] Date of Birth/Gender: Treating RN: 10/26/1945 (76 y.o. [name] Primary Care Physician: Billey Gosling Other Clinician: Referring Physician: Treating Physician/Extender: Mickle Asper in Treatment: 25 Education Assessment Education Provided To: Patient Education Topics Provided Wound/Skin Impairment: Methods: Explain/Verbal Responses: Reinforcements needed, State content correctly [employer]) Signed: 05/03/2022 5:39:23 PM By: Rhae Hammock RN Entered By: Rhae Hammock on 05/01/2022 10:51:27 -------------------------------------------------------------------------------- Wound Assessment Details Patient Name: Date of Service: [name], [name] 05/01/2022 10:15 A M Medical Record Number: [id number] Patient Account Number: [id number] Date of Birth/Sex: Treating RN: [birth year] (76 y.o. M) Primary Care Lavenia Stumpo: Billey Gosling Other Clinician: Referring Rosea Dory: Treating Obdulio Mash/Extender: [place], [place] ([id number]) [id number] Page 7 of 8 Weeks in Treatment: 25 Wound Status Wound Number: 1 Primary Diabetic Wound/Ulcer of the Lower Extremity Etiology: Wound Location: Right, Lateral Lower Leg Wound Open Wounding Event: Gradually Appeared Status: Date Acquired: 08/15/2021 Comorbid Cataracts, Sleep Apnea, Congestive Heart Failure, Hypertension, Weeks Of Treatment: 25 History: Myocardial Infarction, Type II Diabetes, Gout Clustered Wound: No Photos Wound Measurements Length: (cm) 1.1 Width: (cm) 0.9 Depth: (cm) 0.1 Area: (cm) 0.778 Volume: (cm) 0.078 % Reduction in Area: 96.4% % Reduction in Volume: 96.4% Epithelialization: Large (67-100%) Tunneling: No Undermining: No Wound Description Classification: Grade 2 Wound Margin: Distinct, outline attached Exudate Amount: Medium Exudate Type: Serosanguineous Exudate Color: red, brown Foul Odor  After Cleansing: No Slough/Fibrino Yes Wound Bed Granulation Amount: Large (67-100%) Exposed Structure Granulation Quality: Red, Pink Fascia Exposed: No Necrotic Amount: None Present (0%) Fat Layer (Subcutaneous Tissue) Exposed: Yes Tendon Exposed: No  Muscle Exposed: No Joint Exposed: No Bone Exposed: No Periwound Skin Texture Texture Color No Abnormalities Noted: No No Abnormalities Noted: No Callus: No Atrophie Blanche: No Crepitus: No Cyanosis: No Excoriation: No Ecchymosis: No Induration: No Erythema: No Rash: No Hemosiderin Staining: No Scarring: Yes Mottled: No Pallor: No Moisture Rubor: No No Abnormalities Noted: No Dry / Scaly: No Temperature / Pain Maceration: No Temperature: No Abnormality Tenderness on Palpation: Yes Treatment Notes Wound #1 (Lower Leg) Wound Laterality: Right, Lateral Cleanser Soap and Water Discharge Instruction: May shower and wash wound with dial antibacterial soap and water prior to dressing change. Wound Cleanser Discharge Instruction: Cleanse the wound with wound cleanser prior to applying a clean dressing using gauze sponges, not tissue or cotton balls. Peri-Wound Care Corey [name], Corey [name] ([id number]) [id number] Page 8 of 8 Topical Primary Dressing Fibracol Plus Dressing, 4x4.38 in (collagen) Discharge Instruction: Moisten collagen with saline or hydrogel until the blastx and powder collagen arrive. Secondary Dressing ABD Pad, 8x10 Discharge Instruction: Apply over primary dressing as directed. Woven Gauze Sponge, Non-Sterile 4x4 in Discharge Instruction: Apply over primary dressing as directed. Secured With Compression Wrap ThreePress (3 layer compression wrap) Discharge Instruction: Apply three layer compression as directed. Compression Stockings Add-Ons Electronic Signature(s) Signed: 05/01/2022 4:19:27 PM By: Erenest Blank Entered By: Erenest Blank on 05/01/2022  10:34:26 -------------------------------------------------------------------------------- Vitals Details Patient Name: Date of Service: [name], [name] 05/01/2022 10:15 A M Medical Record Number: [id number] Patient Account Number: [id number] Date of Birth/Sex: Treating RN: [birth year] (76 y.o. M) Primary Care Shevette Bess: Billey Gosling Other Clinician: Referring Doss Cybulski: Treating Landri Dorsainvil/Extender: Mickle Asper in Treatment: 25 Vital Signs Time Taken: 10:21 Temperature (F): 97.7 Height (in): 66 Pulse (bpm): 57 Weight (lbs): 145 Respiratory Rate (breaths/min): 18 Body Mass Index (BMI): 23.4 Blood Pressure (mmHg): 135/80 Reference Range: 80 - 120 mg / dl Electronic Signature(s) Signed: 05/01/2022 4:19:27 PM By: Erenest Blank Entered By: Erenest Blank on 05/01/2022 10:21:18

## 2022-05-04 NOTE — Telephone Encounter (Signed)
Pt c/o swelling: STAT is pt has developed SOB within 24 hours  If swelling, where is the swelling located?   +1 edema in bilateral extremeties  How much weight have you gained and in what time span? 6 lbs since last week  Have you gained 3 pounds in a day or 5 pounds in a week?   Same since yesterday  Do you have a log of your daily weights (if so, list)?   Yes  Are you currently taking a fluid pill? Yes  Are you currently SOB?  A little  Have you traveled recently?   No   Caller noted patient's lungs are clear but the patient has reported a little SOB.  Caller reported the patient took extra torsemide but the patient's weight stayed the same.  Caller stated patient can be called directly.

## 2022-05-04 NOTE — Progress Notes (Signed)
Corey [name] ([id number]) [id number] Page 1 of 10 Visit Report for 05/01/2022 Chief Complaint Document Details Patient Name: Date of Service: Corey [name]. 05/01/2022 10:15 A M Medical Record Number: [id number] Patient Account Number: [id number] Date of Birth/Sex: Treating RN: 1946-03-23 (76 [name].o. M) Primary Care Provider: Billey [name] Other Clinician: Referring Provider: Treating Provider/Extender: Mickle Asper in Treatment: 25 Information Obtained from: Patient Chief Complaint 11/01/2021; right lower extremity wound Electronic Signature(s) Signed: 05/01/2022 11:20:58 AM By: Kalman Shan DO Entered By: Kalman Shan on 05/01/2022 10:58:56 -------------------------------------------------------------------------------- Debridement Details Patient Name: Date of Service: Corey [name], Corey [name] M. 05/01/2022 10:15 A M Medical Record Number: [id number] Patient Account Number: [id number] Date of Birth/Sex: Treating RN: 06-Oct-1945 (14 [name].o. [name], [name] Primary Care Provider: Billey [name] Other Clinician: Referring Provider: Treating Provider/Extender: Mickle Asper in Treatment: 25 Debridement Performed for Assessment: Wound #1 Right,Lateral Lower Leg Performed By: Physician Kalman Shan, DO Debridement Type: Debridement Severity of Tissue Pre Debridement: Fat layer exposed Level of Consciousness (Pre-procedure): Awake and Alert Pre-procedure Verification/Time Out Yes - 10:45 Taken: Start Time: 10:45 Pain Control: Lidocaine T Area Debrided (L x W): otal 1.1 (cm) x 0.9 (cm) = 0.99 (cm) Tissue and other material debrided: Viable, Non-Viable, Slough, Subcutaneous, Slough Level: Skin/Subcutaneous Tissue Debridement Description: Excisional Instrument: Curette Bleeding: Minimum Hemostasis Achieved: Pressure End Time: 10:45 Procedural Pain: 0 Post Procedural Pain: 0 Response to Treatment:  Procedure was tolerated well Level of Consciousness (Post- Awake and Alert procedure): Post Debridement Measurements of Total Wound Length: (cm) 1.1 Width: (cm) 0.9 Depth: (cm) 0.1 Volume: (cm) 0.078 Character of Wound/Ulcer Post Debridement: Improved Severity of Tissue Post Debridement: Fat layer exposed Corey [name], Corey [name] ([id number]) [id number] Page 2 of 10 Post Procedure Diagnosis Same as Pre-procedure Electronic Signature(s) Signed: 05/01/2022 11:20:58 AM By: Kalman Shan DO Signed: 05/03/2022 5:39:23 PM By: Rhae Hammock RN Entered By: Rhae Hammock on 05/01/2022 10:45:43 -------------------------------------------------------------------------------- HPI Details Patient Name: Date of Service: Corey [name], Corey [name] M. 05/01/2022 10:15 A M Medical Record Number: [id number] Patient Account Number: [id number] Date of Birth/Sex: Treating RN: [birth year] (64 [name].o. M) Primary Care Provider: Billey [name] Other Clinician: Referring Provider: Treating Provider/Extender: Mickle Asper in Treatment: 25 History of Present Illness HPI Description: Admission 11/01/2021 Corey [name] Is a 76 year old male with a past medical history of of type 2 diabetes, coronary artery disease, stage IV kidney disease acute on chronic combined systolic and diastolic heart failure that presents to the clinic for a 78-monthhistory of right lower extremity wound. He states he is not sure how this started. It is completely eschared. He reports his wound has been stable for the past month. He has been keeping the area covered. He has mild chronic pain. He denies signs of infection. He states he has a history of lower extremity ulcers last being on the left leg. He could not tell me how this was treated in the past. 6/27; patient presents for follow-up. He is scheduled for his lower extremity arterial studies on July 6th. He has been using Medihoney to the wound  bed. He has no issues or complaints today. He denies signs of infection. 7/6; patient presents for follow-up. He has home health that reported the wound having odor and increased pain last week. He was sent to urgent care. He was started on doxycycline. He is continuing Medihoney. He has his scheduled ABIs w/TBIs today. 7/10; patient presents for follow-up. He had  his arterial studies done last week. He had triphasic waveforms throughout the right lower extremity. His TBI was 0.64. He has good pedal pulses. He is almost done with his course of antibiotics. He has been using mupirocin ointment to the wound bed. 7/17; patient presents for follow-up. He has been using Iodosorb under compression therapy. He has no issues or complaints today. He states he is receiving Keystone antibiotic tomorrow In the mail. 7/24; patient presents for follow-up. He has been doing Dakin's wet-to-dry dressings to the wound bed daily without issues. He currently denies signs of infection. He reports completing his course of doxycycline. He has his Keystone antibiotic with him today. 7/31; patient presents for follow-up. We have been using Iodoflex with [facility] under compression therapy. He has no issues or complaints today. He denies signs of infection. 8/7; patient presents for follow-up. We have been using Iodoflex with [facility] under compression therapy. Home health came out once last week to change the wrap. He has no issues or complaints today. He denies signs of infection. 8/14; patient presents for follow-up. We have been using Keystone with Hydrofera Blue under compression therapy. He has tolerated this well. He has no issues or complaints today. 8/21; patient presents for follow-up. We have been using Keystone with Hydrofera Blue under compression therapy. The Hydrofera Blue was stuck on tightly to the wound bed today. Other than that he has no issues or complaints. We have not heard back for insurance approval  for skin sub. 8/28; patient presents for follow-up. We ordered blast X and collagen powder at last clinic visit. It is unclear if he has received this. We placed Keystone with Fibracol under 3 layer compression last week. Patient has home health. Patient has no issues or complaints today. He was not approved for TheraSkin. 9/5; patient presents for follow-up. Insurance would not cover blast X. We do have a sample and he would like to try this. We have been using Keystone antibiotic and collagen under 3 layer compression. He has no issues or complaints today. 9/18; patient presents for follow-up. We have been using collagen with blast X and Keystone under compression therapy. He has no issues or complaints today. 9/25; patient presents for follow-up. We have continued to use collagen, blast X and Keystone under compression therapy. Patient has no issues or complaints today. 10/6; patient presents for follow-up. We have been using collagen, blast X and Keystone under compression therapy. He denies signs of infection and has no issues or complaints today. Patient has home health that comes out on Wednesdays and Fridays. 10/16; patient presents for follow-up. He has been using collagen, blast X and Keystone with benefit to the wound bed under compression therapy. He is about to run out of [facility] antibiotic ointment. He has no issues or complaints today. 10/23; patient presents for follow-up. We continue to use collagen, blast X and Keystone under compression therapy. Continued report in wound healing. Home health continues to come out to do dressing changes. Patient has no issues or complaints today. Corey [name], Corey [name] ([id number]) [id number] Page 3 of 10 10/30; patient presents for follow-up. We have been using collagen, blast X and Keystone antibiotic under compression therapy. His wound continues to show improvement in healing. He has no issues or complaints today. 11/30;  patient was last seen 1 month ago. He has missed his last clinic visit. He has home health who changes the dressings 3 times weekly. He has been using collagen with Keystone antibiotic under 3 layer compression.  He has run out of [place] antibiotic ointment. The wound is almost healed. 12/18; patient was last seen 3 weeks ago. We have been using collagen with Keystone antibiotic under 3 layer compression. Home health has been coming out twice a week. He has no issues or complaints today. Electronic Signature(s) Signed: 05/01/2022 11:20:58 AM By: Kalman Shan DO Entered By: Kalman Shan on 05/01/2022 11:00:03 -------------------------------------------------------------------------------- Physical Exam Details Patient Name: Date of Service: Corey [name], Corey [name] M. 05/01/2022 10:15 A M Medical Record Number: [id number] Patient Account Number: [id number] Date of Birth/Sex: Treating RN: February 21, 1946 (64 [name].o. M) Primary Care Provider: Billey [name] Other Clinician: Referring Provider: Treating Provider/Extender: Mickle Asper in Treatment: 25 Constitutional respirations regular, non-labored and within target range for patient.. Cardiovascular 2+ dorsalis pedis/posterior tibialis pulses. Psychiatric pleasant and cooperative. Notes Right lower extremity: T the anterior aspect there is an open wound with nonviable tissue and granulation tissue. No surrounding signs of infection. Good o edema control. Electronic Signature(s) Signed: 05/01/2022 11:20:58 AM By: Kalman Shan DO Entered By: Kalman Shan on 05/01/2022 11:00:50 -------------------------------------------------------------------------------- Physician Orders Details Patient Name: Date of Service: Corey [name], Corey [name] M. 05/01/2022 10:15 A M Medical Record Number: [id number] Patient Account Number: [id number] Date of Birth/Sex: Treating RN: 1945/08/25 (29 [name].o. [name] Primary Care Provider:  Billey [name] Other Clinician: Referring Provider: Treating Provider/Extender: Mickle Asper in Treatment: 43 Verbal / Phone Orders: No Diagnosis Coding Follow-up Appointments ppointment in 2 weeks. - w/ Dr. Heber [place] Return A Anesthetic (In clinic) Topical Lidocaine 5% applied to wound bed (In clinic) Topical Lidocaine 4% applied to wound bed Cellular or Tissue Based Products KALIK, HOARE ([id number]) [id number] Page 4 of 10 Cellular or Tissue Based Product Type: - run insurance for [employer], epicord, and organ[employer] advance tissue product. [employer]askin not approved. Epicord not covered. [employer] Apligraf approved. Bathing/ Shower/ Hygiene May shower with protection but do not get wound dressing(s) wet. Edema Control - Lymphedema / SCD / Other Elevate legs to the level of the heart or above for 30 minutes daily and/or when sitting, a frequency of: - 3-4 times a day throughout the day. Avoid standing for long periods of time. Patient to wear own compression stockings every day. - We will apply a tubi grip size E double folded today Home Health New wound care orders this week; continue Home Health for wound care. May utilize formulary equivalent dressing for wound treatment orders unless otherwise specified. Alvis Lemmings to change 2 x a week. Only doing collagen under 3 layer. Other Home Health Orders/Instructions: - [facility] Wound Treatment Wound #1 - Lower Leg Wound Laterality: Right, Lateral Cleanser: Soap and Water (Home Health) 3 x Per Week/30 Days Discharge Instructions: May shower and wash wound with dial antibacterial soap and water prior to dressing change. Cleanser: Wound Cleanser (Home Health) 3 x Per Week/30 Days Discharge Instructions: Cleanse the wound with wound cleanser prior to applying a clean dressing using gauze sponges, not tissue or cotton balls. Prim Dressing: Fibracol Plus Dressing, 4x4.38 in  (collagen) (Home Health) 3 x Per Week/30 Days ary Discharge Instructions: Moisten collagen with saline or hydrogel until the blastx and powder collagen arrive. Secondary Dressing: ABD Pad, 8x10 (Home Health) 3 x Per Week/30 Days Discharge Instructions: Apply over primary dressing as directed. Secondary Dressing: Woven Gauze Sponge, Non-Sterile 4x4 in (Home Health) 3 x Per Week/30 Days Discharge Instructions: Apply over primary dressing as directed. Compression Wrap: ThreePress (3 layer compression wrap) (  Home Health) 3 x Per Week/30 Days Discharge Instructions: Apply three layer compression as directed. Electronic Signature(s) Signed: 05/01/2022 11:20:58 AM By: Kalman Shan DO Entered By: Kalman Shan on 05/01/2022 11:00:58 -------------------------------------------------------------------------------- Problem List Details Patient Name: Date of Service: Corey [name], Corey [name] M. 05/01/2022 10:15 A M Medical Record Number: [id number] Patient Account Number: [id number] Date of Birth/Sex: Treating RN: May 24, 1945 (10 [name].o. M) Primary Care Provider: Billey [name] Other Clinician: Referring Provider: Treating Provider/Extender: Mickle Asper in Treatment: 25 Active Problems ICD-10 Encounter Code Description Active Date MDM Diagnosis L97.918 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 with other 11/01/2021 No Yes specified severity E11.622 Type 2 diabetes mellitus with other skin ulcer 11/01/2021 No Yes I50.42 Chronic combined systolic (congestive) and diastolic (congestive) heart failure 11/01/2021 No Yes Corey [name], Corey [name] ([id number]) [id number] Page 5 of 10 I25.119 Atherosclerotic heart disease of native coronary artery with unspecified angina 11/01/2021 No Yes pectoris I87.311 Chronic venous hypertension (idiopathic) with ulcer of right lower extremity 12/26/2021 No Yes Inactive Problems Resolved Problems Electronic  Signature(s) Signed: 05/01/2022 11:20:58 AM By: Kalman Shan DO Entered By: Kalman Shan on 05/01/2022 10:58:42 -------------------------------------------------------------------------------- Progress Note Details Patient Name: Date of Service: Corey [name], Corey [name] M. 05/01/2022 10:15 A M Medical Record Number: [id number] Patient Account Number: [id number] Date of Birth/Sex: Treating RN: May 26, 1945 (14 [name].o. M) Primary Care Provider: Billey [name] Other Clinician: Referring Provider: Treating Provider/Extender: Mickle Asper in Treatment: 25 Subjective Chief Complaint Information obtained from Patient 11/01/2021; right lower extremity wound History of Present Illness (HPI) Admission 11/01/2021 Mr. Shakeem Stern Is a 76 year old male with a past medical history of of type 2 diabetes, coronary artery disease, stage IV kidney disease acute on chronic combined systolic and diastolic heart failure that presents to the clinic for a 22-monthhistory of right lower extremity wound. He states he is not sure how this started. It is completely eschared. He reports his wound has been stable for the past month. He has been keeping the area covered. He has mild chronic pain. He denies signs of infection. He states he has a history of lower extremity ulcers last being on the left leg. He could not tell me how this was treated in the past. 6/27; patient presents for follow-up. He is scheduled for his lower extremity arterial studies on July 6th. He has been using Medihoney to the wound bed. He has no issues or complaints today. He denies signs of infection. 7/6; patient presents for follow-up. He has home health that reported the wound having odor and increased pain last week. He was sent to urgent care. He was started on doxycycline. He is continuing Medihoney. He has his scheduled ABIs w/TBIs today. 7/10; patient presents for follow-up. He had his arterial studies done last week. He  had triphasic waveforms throughout the right lower extremity. His TBI was 0.64. He has good pedal pulses. He is almost done with his course of antibiotics. He has been using mupirocin ointment to the wound bed. 7/17; patient presents for follow-up. He has been using Iodosorb under compression therapy. He has no issues or complaints today. He states he is receiving Keystone antibiotic tomorrow In the mail. 7/24; patient presents for follow-up. He has been doing Dakin's wet-to-dry dressings to the wound bed daily without issues. He currently denies signs of infection. He reports completing his course of doxycycline. He has his Keystone antibiotic with him today. 7/31; patient presents for follow-up. We have been  using Iodoflex with Keystone under compression therapy. He has no issues or complaints today. He denies signs of infection. 8/7; patient presents for follow-up. We have been using Iodoflex with [facility] under compression therapy. Home health came out once last week to change the wrap. He has no issues or complaints today. He denies signs of infection. 8/14; patient presents for follow-up. We have been using Keystone with Hydrofera Blue under compression therapy. He has tolerated this well. He has no issues or complaints today. 8/21; patient presents for follow-up. We have been using Keystone with Hydrofera Blue under compression therapy. The Hydrofera Blue was stuck on tightly to the wound bed today. Other than that he has no issues or complaints. We have not heard back for insurance approval for skin sub. 8/28; patient presents for follow-up. We ordered blast X and collagen powder at last clinic visit. It is unclear if he has received this. We placed Keystone with Fibracol under 3 layer compression last week. Patient has home health. Patient has no issues or complaints today. He was not approved for TheraSkin. Corey [name], Corey [name] ([id number]) [id number] Page 6 of 10 9/5;  patient presents for follow-up. Insurance would not cover blast X. We do have a sample and he would like to try this. We have been using Keystone antibiotic and collagen under 3 layer compression. He has no issues or complaints today. 9/18; patient presents for follow-up. We have been using collagen with blast X and Keystone under compression therapy. He has no issues or complaints today. 9/25; patient presents for follow-up. We have continued to use collagen, blast X and Keystone under compression therapy. Patient has no issues or complaints today. 10/6; patient presents for follow-up. We have been using collagen, blast X and Keystone under compression therapy. He denies signs of infection and has no issues or complaints today. Patient has home health that comes out on Wednesdays and Fridays. 10/16; patient presents for follow-up. He has been using collagen, blast X and Keystone with benefit to the wound bed under compression therapy. He is about to run out of [facility] antibiotic ointment. He has no issues or complaints today. 10/23; patient presents for follow-up. We continue to use collagen, blast X and Keystone under compression therapy. Continued report in wound healing. Home health continues to come out to do dressing changes. Patient has no issues or complaints today. 10/30; patient presents for follow-up. We have been using collagen, blast X and Keystone antibiotic under compression therapy. His wound continues to show improvement in healing. He has no issues or complaints today. 11/30; patient was last seen 1 month ago. He has missed his last clinic visit. He has home health who changes the dressings 3 times weekly. He has been using collagen with Keystone antibiotic under 3 layer compression. He has run out of [place] antibiotic ointment. The wound is almost healed. 12/18; patient was last seen 3 weeks ago. We have been using collagen with Keystone antibiotic under 3 layer compression.  Home health has been coming out twice a week. He has no issues or complaints today. Patient History Family History Cancer - Father, Diabetes - Mother,Father, Heart Disease - Mother,Siblings, Hypertension - Mother,Father. Social History Former smoker - quit 1970, Marital Status - Married, Drug Use - No History, Caffeine Use - Moderate - coffee. Medical History Eyes Patient has history of Cataracts Respiratory Patient has history of Sleep Apnea - has CPAP does not wear Cardiovascular Patient has history of Congestive Heart Failure, Hypertension, Myocardial Infarction - 2014  Endocrine Patient has history of Type II Diabetes Musculoskeletal Patient has history of Gout Neurologic Denies history of Seizure Disorder Hospitalization/Surgery History - 06/2021 inguinal hernia repair. - cardioversion 09/2020. - ventral hernia repair 3/19. 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 Vitals Time Taken: 10:21 AM, Height: 66 in, Weight: 145 lbs, BMI: 23.4, Temperature: 97.7 F, Pulse: 57 bpm, Respiratory Rate: 18 breaths/min, Blood Pressure: 135/80 mmHg. Cardiovascular 2+ dorsalis pedis/posterior tibialis pulses. Psychiatric pleasant and cooperative. General Notes: Right lower extremity: T the anterior aspect there is an open wound with nonviable tissue and granulation tissue. No surrounding signs of o infection. Good edema control. Integumentary (Hair, Skin) Corey [name], Corey [name] ([id number]) [id number] Page 7 of 10 Wound #1 status is Open. Original cause of wound was Gradually Appeared. The date acquired was: 08/15/2021. The wound has been in treatment 25 weeks. The wound is located on the Right,Lateral Lower Leg. The  wound measures 1.1cm length x 0.9cm width x 0.1cm depth; 0.778cm^2 area and 0.078cm^3 volume. There is Fat Layer (Subcutaneous Tissue) exposed. There is no tunneling or undermining noted. There is a medium amount of serosanguineous drainage noted. The wound margin is distinct with the outline attached to the wound base. There is large (67-100%) red, pink granulation within the wound bed. There is no necrotic tissue within the wound bed. The periwound skin appearance exhibited: Scarring. The periwound skin appearance did not exhibit: Callus, Crepitus, Excoriation, Induration, Rash, Dry/Scaly, Maceration, Atrophie Blanche, Cyanosis, Ecchymosis, Hemosiderin Staining, Mottled, Pallor, Rubor, Erythema. Periwound temperature was noted as No Abnormality. The periwound has tenderness on palpation. 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 Type 2 diabetes mellitus with other skin ulcer Chronic combined systolic (congestive) and diastolic (congestive) heart failure Atherosclerotic heart disease of native coronary artery with unspecified angina pectoris Chronic venous hypertension (idiopathic) with ulcer of right lower extremity Patient's wound has shown improvement in size and appearance since last clinic visit. I debrided nonviable tissue. I recommended collagen under 3 layer compression. Follow-up in 2 weeks. Procedures Wound #1 Pre-procedure diagnosis of Wound #1 is a Diabetic Wound/Ulcer of the Lower Extremity located on the Right,Lateral Lower Leg .Severity of Tissue Pre Debridement is: Fat layer exposed. There was a Excisional Skin/Subcutaneous Tissue Debridement with a total area of 0.99 sq cm performed by Kalman Shan, DO. With the following instrument(s): Curette to remove Viable and Non-Viable tissue/material. Material removed includes Subcutaneous Tissue and Slough and after achieving pain control using  Lidocaine. No specimens were taken. A time out was conducted at 10:45, prior to the start of the procedure. A Minimum amount of bleeding was controlled with Pressure. The procedure was tolerated well with a pain level of 0 throughout and a pain level of 0 following the procedure. Post Debridement Measurements: 1.1cm length x 0.9cm width x 0.1cm depth; 0.078cm^3 volume. Character of Wound/Ulcer Post Debridement is improved. Severity of Tissue Post Debridement is: Fat layer exposed. Post procedure Diagnosis Wound #1: Same as Pre-Procedure Pre-procedure diagnosis of Wound #1 is a Diabetic Wound/Ulcer of the Lower Extremity located on the Right,Lateral Lower Leg . There was a Three Layer Compression Therapy Procedure by Rhae Hammock, RN. Post procedure Diagnosis Wound #1: Same as Pre-Procedure Plan Follow-up Appointments: Return Appointment in 2 weeks. - w/ Dr. Heber  Anesthetic: (In clinic) Topical  Lidocaine 5% applied to wound bed (In clinic) Topical Lidocaine 4% applied to wound bed Cellular or Tissue Based Products: Cellular or Tissue Based Product Type: - run insurance for [employer], epicord, and organogenesis advance tissue product. Theraskin not approved. Epicord not covered. [employer] Apligraf approved. Bathing/ Shower/ Hygiene: May shower with protection but do not get wound dressing(s) wet. Edema Control - Lymphedema / SCD / Other: Elevate legs to the level of the heart or above for 30 minutes daily and/or when sitting, a frequency of: - 3-4 times a day throughout the day. Avoid standing for long periods of time. Patient to wear own compression stockings every day. - We will apply a tubi grip size E double folded today Home Health: New wound care orders this week; continue Home Health for wound care. May utilize formulary equivalent dressing for wound treatment orders unless otherwise specified. Alvis Lemmings to change 2 x a week. Only doing collagen under 3 layer. Other Home  Health Orders/Instructions: - [facility] WOUND #1: - Lower Leg Wound Laterality: Right, Lateral Cleanser: Soap and Water (Home Health) 3 x Per Week/30 Days Discharge Instructions: May shower and wash wound with dial antibacterial soap and water prior to dressing change. Cleanser: Wound Cleanser (Home Health) 3 x Per Week/30 Days Discharge Instructions: Cleanse the wound with wound cleanser prior to applying a clean dressing using gauze sponges, not tissue or cotton balls. Prim Dressing: Fibracol Plus Dressing, 4x4.38 in (collagen) (Home Health) 3 x Per Week/30 Days ary Discharge Instructions: Moisten collagen with saline or hydrogel until the blastx and powder collagen arrive. Secondary Dressing: ABD Pad, 8x10 (Home Health) 3 x Per Week/30 Days Discharge Instructions: Apply over primary dressing as directed. Secondary Dressing: Woven Gauze Sponge, Non-Sterile 4x4 in (Home Health) 3 x Per Week/30 Days Discharge Instructions: Apply over primary dressing as directed. Com pression Wrap: ThreePress (3 layer compression wrap) (Home Health) 3 x Per Week/30 Days Discharge Instructions: Apply three layer compression as directed. Corey [name], Corey [name] ([id number]) [id number] Page 8 of 10 1. Collagen under 3 layer compressionooright lower extremity 2. Follow-up in 2 weeks 3. Home health to change wraps twice weekly Electronic Signature(s) Signed: 05/01/2022 11:20:58 AM By: Kalman Shan DO Entered By: Kalman Shan on 05/01/2022 11:01:43 -------------------------------------------------------------------------------- HxROS Details Patient Name: Date of Service: Corey [name], Corey [name] M. 05/01/2022 10:15 A M Medical Record Number: [id number] Patient Account Number: [id number] Date of Birth/Sex: Treating RN: [birth year] (55 [name].o. M) Primary Care Provider: Billey [name] Other Clinician: Referring Provider: Treating Provider/Extender: [name] in  Treatment: 25 Constitutional Symptoms (General Health) Medical History: Past Medical History Notes: CVA 2014 bilateral hearing loss Eyes Medical History: Positive for: Cataracts Respiratory Medical History: Positive for: Sleep Apnea - has CPAP does not wear Cardiovascular Medical History: Positive for: Congestive Heart Failure; Hypertension; Myocardial Infarction - 2014 Gastrointestinal Medical History: Past Medical History Notes: Diverticulosis Endocrine Medical History: Positive for: Type II Diabetes Time with diabetes: >20 years Treated with: Oral agents, Diet Blood sugar tested every day: Yes Tested : BID Genitourinary Medical History: Past Medical History Notes: Renal insufficiency 20/ Musculoskeletal Medical History: Positive for: Gout Neurologic Medical HistoryMarland Kitchen Corey [name], Corey [name] ([id number]) [id number] Page 9 of 10 Negative for: Seizure Disorder Oncologic Medical History: Past Medical History Notes: Prostate Ca HBO Extended History Items Eyes: Cataracts Immunizations Pneumococcal Vaccine: Received Pneumococcal Vaccination: No Implantable Devices No devices added Hospitalization / Surgery History Type of Hospitalization/Surgery 06/2021 inguinal hernia repair cardioversion 09/2020 ventral hernia repair 3/19 Family and  Social History Cancer: Yes - Father; Diabetes: Yes - Mother,Father; Heart Disease: Yes - Mother,Siblings; Hypertension: Yes - Mother,Father; Former smoker - quit 1970; Marital Status - Married; Drug Use: No History; Caffeine Use: Moderate - coffee; Financial Concerns: No; Food, Clothing or Shelter Needs: No; Support System Lacking: No; Transportation Concerns: No Electronic Signature(s) Signed: 05/01/2022 11:20:58 AM By: Kalman Shan DO Entered By: Kalman Shan on 05/01/2022 11:00:11 -------------------------------------------------------------------------------- SuperBill Details Patient Name: Date of  Service: Corey [name], Corey [name] M. 05/01/2022 Medical Record Number: [id number] Patient Account Number: [id number] Date of Birth/Sex: Treating RN: [birth year] (31 [name].o. 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 with other specified severity E11.622 Type 2 diabetes mellitus with other skin ulcer I50.42 Chronic combined systolic (congestive) and diastolic (congestive) heart failure I25.119 Atherosclerotic heart disease of native coronary artery with unspecified angina pectoris I87.311 Chronic venous hypertension (idiopathic) with ulcer of right lower extremity Facility Procedures : CPT4 Code: 96283662 Description: 94765 - DEB SUBQ TISSUE 20 SQ CM/< ICD-10 Diagnosis Description L97.918 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

## 2022-05-04 NOTE — Telephone Encounter (Signed)
Pt c/o medication issue:  1. Name of Medication:   Tafamidis (VYNDAMAX) 61 MG CAPS    2. How are you currently taking this medication (dosage and times per day)? 1 tablet daily  3. Are you having a reaction (difficulty breathing--STAT)? no  4. What is your medication issue? Covermymeds calling to follow up on prior authorization for the medication. Phone: [phone]

## 2022-05-04 NOTE — Telephone Encounter (Signed)
Called and spoke with pt and wife who is reporting pt has been having alittle SOB with activity.  Wife gave him an extra Torsemide last night and urine outpt has increased.  Advised to continue to monitor NA+ intake and take Torsemide as ordered.  He will call back if no improvement in SOB.  Both states understanding and were appreciative of call back and information.

## 2022-05-05 ENCOUNTER — Encounter (HOSPITAL_COMMUNITY): Payer: Self-pay

## 2022-05-05 NOTE — Telephone Encounter (Signed)
Patient gets medication from manufacturer assistance program.  PA not currently needed

## 2022-05-05 NOTE — Telephone Encounter (Signed)
Patient advised via vm, will send mychart message as well.

## 2022-05-09 ENCOUNTER — Telehealth: Payer: Self-pay

## 2022-05-09 ENCOUNTER — Other Ambulatory Visit (HOSPITAL_COMMUNITY): Payer: Self-pay

## 2022-05-09 ENCOUNTER — Telehealth: Payer: Self-pay | Admitting: Internal Medicine

## 2022-05-09 NOTE — Telephone Encounter (Signed)
Patient called and stated patient needed a refill on his hydralazine '25mg'$ , as he is completely out.

## 2022-05-09 NOTE — Telephone Encounter (Signed)
(  1:59 pm) PC SW scheduled an initial palliative care visit. RN/SW scheduled to see patient on 05/17/22 '@12'$ :30 pm.

## 2022-05-11 ENCOUNTER — Other Ambulatory Visit (INDEPENDENT_AMBULATORY_CARE_PROVIDER_SITE_OTHER): Payer: Medicare HMO

## 2022-05-11 DIAGNOSIS — N184 Chronic kidney disease, stage 4 (severe): Secondary | ICD-10-CM | POA: Diagnosis not present

## 2022-05-11 DIAGNOSIS — E119 Type 2 diabetes mellitus without complications: Secondary | ICD-10-CM | POA: Diagnosis not present

## 2022-05-11 LAB — BASIC METABOLIC PANEL
BUN: 68 mg/dL — ABNORMAL HIGH (ref 6–23)
CO2: 25 mEq/L (ref 19–32)
Calcium: 9.8 mg/dL (ref 8.4–10.5)
Chloride: 106 mEq/L (ref 96–112)
Creatinine, Ser: 3.64 mg/dL — ABNORMAL HIGH (ref 0.40–1.50)
GFR: 15.78 mL/min — ABNORMAL LOW (ref >60.00)
Glucose, Bld: 86 mg/dL (ref 70–99)
Potassium: 4 mEq/L (ref 3.5–5.1)
Sodium: 141 mEq/L (ref 135–145)

## 2022-05-11 LAB — HEMOGLOBIN A1C: Hgb A1c MFr Bld: 6 % (ref 4.6–6.5)

## 2022-05-11 MED ORDER — HYDRALAZINE HCL 25 MG PO TABS: 25.0000 mg | ORAL_TABLET | Freq: Three times a day (TID) | ORAL | 0 refills | Status: DC

## 2022-05-11 NOTE — Telephone Encounter (Signed)
Sent in today for patient. 

## 2022-05-12 ENCOUNTER — Encounter: Payer: Self-pay | Admitting: Speech Pathology

## 2022-05-12 ENCOUNTER — Telehealth: Payer: Self-pay | Admitting: Cardiology

## 2022-05-12 NOTE — Telephone Encounter (Signed)
Angel with [facility] is calling to report patient has gone up in weight. He is now at 182 lbs. Pt also states that he is having a cough that she has not heard since visiting with him. She also states that he is having SOB currently that he says has increased within the last 24 hrs.

## 2022-05-12 NOTE — Telephone Encounter (Signed)
Patient is scheduled to see Dr. Daniel Nones on January 9th. We will repeat labs at visit.

## 2022-05-12 NOTE — Telephone Encounter (Signed)
Spoke with Pharmacologist with [facility].  Reports pt weight has been up.  Pt has actually lost 2 lbs. Today 12/29- 182 lbs Tuesday- Thursday-184 lbs  Pt c/o increased SOB and swelling to left arm and hand as well as LLE.  Nonproductive cough per nurse lungs are clear. BP 134/70- 46 (fingers cold hard to get a good reading per nurse).  Per pt and spouse did not eat more salt then normal over the holidays.  UOP adequate per pt. Pt reports has not missed any doses of torsemide.  Upon further investigation pt only taking torsemide 20 mg M-W-F.   Advised per Dr. Daniel Nones on 05/04/22: Lets ask him to increase torsemide to '40mg'$  x 3 days and then '20mg'$  daily. He will need a repeat BMET and BNP in a week.  Had labs drawn with Dr. Dwyane Dee 05/11/22.   Advised pt to take torsemide daily as instructed.  Also, advised to take an extra dose of torsemide today for SOB.  Will send message over to HF clinic to review.  Advised to call 911 if symptoms worsen.

## 2022-05-16 ENCOUNTER — Ambulatory Visit (INDEPENDENT_AMBULATORY_CARE_PROVIDER_SITE_OTHER): Payer: Medicare HMO | Admitting: Endocrinology

## 2022-05-16 ENCOUNTER — Encounter: Payer: Self-pay | Admitting: Endocrinology

## 2022-05-16 VITALS — BP 132/80 | Ht 66.0 in | Wt 187.2 lb

## 2022-05-16 DIAGNOSIS — M25461 Effusion, right knee: Secondary | ICD-10-CM

## 2022-05-16 DIAGNOSIS — N185 Chronic kidney disease, stage 5: Secondary | ICD-10-CM | POA: Diagnosis not present

## 2022-05-16 DIAGNOSIS — I77819 Aortic ectasia, unspecified site: Secondary | ICD-10-CM

## 2022-05-16 DIAGNOSIS — E854 Organ-limited amyloidosis: Secondary | ICD-10-CM

## 2022-05-16 DIAGNOSIS — Z7984 Long term (current) use of oral hypoglycemic drugs: Secondary | ICD-10-CM

## 2022-05-16 DIAGNOSIS — L97811 Non-pressure chronic ulcer of other part of right lower leg limited to breakdown of skin: Secondary | ICD-10-CM | POA: Diagnosis not present

## 2022-05-16 DIAGNOSIS — M1A30X0 Chronic gout due to renal impairment, unspecified site, without tophus (tophi): Secondary | ICD-10-CM | POA: Diagnosis not present

## 2022-05-16 DIAGNOSIS — I4892 Unspecified atrial flutter: Secondary | ICD-10-CM

## 2022-05-16 DIAGNOSIS — I43 Cardiomyopathy in diseases classified elsewhere: Secondary | ICD-10-CM

## 2022-05-16 DIAGNOSIS — E119 Type 2 diabetes mellitus without complications: Secondary | ICD-10-CM | POA: Diagnosis not present

## 2022-05-16 DIAGNOSIS — Z8701 Personal history of pneumonia (recurrent): Secondary | ICD-10-CM

## 2022-05-16 DIAGNOSIS — E1122 Type 2 diabetes mellitus with diabetic chronic kidney disease: Secondary | ICD-10-CM | POA: Diagnosis not present

## 2022-05-16 DIAGNOSIS — Z87891 Personal history of nicotine dependence: Secondary | ICD-10-CM

## 2022-05-16 DIAGNOSIS — I252 Old myocardial infarction: Secondary | ICD-10-CM

## 2022-05-16 DIAGNOSIS — I5043 Acute on chronic combined systolic (congestive) and diastolic (congestive) heart failure: Secondary | ICD-10-CM | POA: Diagnosis not present

## 2022-05-16 DIAGNOSIS — Z8546 Personal history of malignant neoplasm of prostate: Secondary | ICD-10-CM

## 2022-05-16 DIAGNOSIS — I480 Paroxysmal atrial fibrillation: Secondary | ICD-10-CM

## 2022-05-16 DIAGNOSIS — G4733 Obstructive sleep apnea (adult) (pediatric): Secondary | ICD-10-CM

## 2022-05-16 DIAGNOSIS — E1136 Type 2 diabetes mellitus with diabetic cataract: Secondary | ICD-10-CM | POA: Diagnosis not present

## 2022-05-16 DIAGNOSIS — I25119 Atherosclerotic heart disease of native coronary artery with unspecified angina pectoris: Secondary | ICD-10-CM | POA: Diagnosis not present

## 2022-05-16 DIAGNOSIS — I132 Hypertensive heart and chronic kidney disease with heart failure and with stage 5 chronic kidney disease, or end stage renal disease: Secondary | ICD-10-CM | POA: Diagnosis not present

## 2022-05-16 DIAGNOSIS — I87311 Chronic venous hypertension (idiopathic) with ulcer of right lower extremity: Secondary | ICD-10-CM | POA: Diagnosis not present

## 2022-05-16 DIAGNOSIS — H9190 Unspecified hearing loss, unspecified ear: Secondary | ICD-10-CM

## 2022-05-16 DIAGNOSIS — M1711 Unilateral primary osteoarthritis, right knee: Secondary | ICD-10-CM

## 2022-05-16 DIAGNOSIS — Z9181 History of falling: Secondary | ICD-10-CM

## 2022-05-16 DIAGNOSIS — K5790 Diverticulosis of intestine, part unspecified, without perforation or abscess without bleeding: Secondary | ICD-10-CM

## 2022-05-16 DIAGNOSIS — Z86010 Personal history of colonic polyps: Secondary | ICD-10-CM

## 2022-05-16 DIAGNOSIS — I69311 Memory deficit following cerebral infarction: Secondary | ICD-10-CM

## 2022-05-16 LAB — POCT GLUCOSE (DEVICE FOR HOME USE): POC Glucose: 83 mg/dl (ref 70–99)

## 2022-05-16 NOTE — Patient Instructions (Addendum)
Check blood sugars on waking up 2  days a week  Also check blood sugars about 2 hours after meals and do this after different meals by rotation  Recommended blood sugar levels on waking up are 90-120 and about 2 hours after meal is 130-160  Please bring your blood sugar monitor to each visit, thank you  Call if sugars stay above range

## 2022-05-16 NOTE — Progress Notes (Signed)
Patient ID: Corey [name], male   DOB: 10-Mar-1946, 77 y.o.   MRN: [id number]   Reason for Appointment : Followup of various problems  History of Present Illness          Diagnosis: Type 2 diabetes mellitus, date of diagnosis: 1997      Past history: The patient was initially treated with various oral hypoglycemic drugs and details are not available. About 2009 he was started on insulin and has either taken premixed insulin or Lantus once a day He had difficulty affording his Lantus insulin and had preferred to take premixed insulin Subsequently insulin was tapered off  Recent history:    Oral hypoglycemic drugs:   was on Januvia 25 mg daily  Current blood sugar patterns and problems:  His A1c is lower than usual at 6   He states that his Januvia ran out about a month ago because of the high cost and he did not buy it Again not clear what his blood sugars are as he is checking very sporadically and he thinks his meter has not been working for a week  However in the office and in the labs his blood sugars appear to be mostly below 100 Also has before is having other conditions limiting his ability to walk He is reporting fairly good appetite Weight appears to fluctuate based on his fluid status   Glucose monitoring:  done less than 1 time a day         Glucometer:  Accu-Chek       Blood Glucose readings by recall Usual range 105-115    Self-care:       Meals: 2-3 meals per day. Egg in am at 9-10 am or grits, rarely sandwich at lunch. His largest meal is at suppertime at about 8 pm.         Dietician visit: Most recent: 02/2014               Compliance with the medical regimen:  Fair.    Wt Readings from Last 3 Encounters:  05/16/22 187 lb 3.2 oz (84.9 kg)  04/28/22 175 lb (79.4 kg)  04/18/22 174 lb 3.2 oz (79 kg)    Lab Results  Component Value Date   HGBA1C 6.0 05/11/2022   HGBA1C 5.7 (H) 03/23/2022   HGBA1C 7.0 (H) 08/09/2021    Lab Results   Component Value Date   MICROALBUR 8.5 (H) 11/23/2020   LDLCALC 31 03/23/2022   CREATININE 3.64 (H) 05/11/2022   Lab Results  Component Value Date   FRUCTOSAMINE 366 (H) 10/11/2021   FRUCTOSAMINE 287 (H) 08/09/2021   FRUCTOSAMINE 338 (H) 01/03/2021    Lab on 05/11/2022  Component Date Value Ref Range Status   Hgb A1c MFr Bld 05/11/2022 6.0  4.6 - 6.5 % Final   Glycemic Control Guidelines for People with Diabetes:Non Diabetic:  <6%Goal of Therapy: <7%Additional Action Suggested:  >8%    Sodium 05/11/2022 141  135 - 145 mEq/L Final   Potassium 05/11/2022 4.0  3.5 - 5.1 mEq/L Final   Chloride 05/11/2022 106  96 - 112 mEq/L Final   CO2 05/11/2022 25  19 - 32 mEq/L Final   Glucose, Bld 05/11/2022 86  70 - 99 mg/dL Final   BUN 05/11/2022 68 (H)  6 - 23 mg/dL Final   Creatinine, Ser 05/11/2022 3.64 (H)  0.40 - 1.50 mg/dL Final   GFR 05/11/2022 15.78 (L)  >60.00 mL/min Final   Calculated using the CKD-EPI  Creatinine Equation (2021)   Calcium 05/11/2022 9.8  8.4 - 10.5 mg/dL Final   Other active medical problems addressed: See review of systems   Allergies as of 05/16/2022       Reactions   Hydrochlorothiazide Other (See Comments)   Gout , uncontrolled diabetes and renal insufficiency        Medication List        Accurate as of May 16, 2022 11:22 AM. If you have any questions, ask your nurse or doctor.          Accu-Chek Aviva Plus test strip Generic drug: glucose blood TEST BLOOD SUGAR TWICE DAILY AS DIRECTED   acetaminophen 500 MG tablet Commonly known as: TYLENOL Take 500-1,000 mg by mouth every 6 (six) hours as needed.   albuterol 108 (90 Base) MCG/ACT inhaler Commonly known as: VENTOLIN HFA Inhale 2 puffs into the lungs every 4 (four) hours as needed for wheezing or shortness of breath.   allopurinol 100 MG tablet Commonly known as: ZYLOPRIM Take 50 mg by mouth in the morning.   apixaban 5 MG Tabs tablet Commonly known as: ELIQUIS Take 1 tablet (5 mg  total) by mouth 2 (two) times daily.   bismuth subsalicylate 951 OA/41YS suspension Commonly known as: PEPTO BISMOL Take 30 mLs by mouth every 6 (six) hours as needed for indigestion.   colchicine 0.6 MG tablet Commonly known as: Colcrys Take 2 tabs once and then one tab one hour later as needed for gout   ergocalciferol 1.25 MG (50000 UT) capsule Commonly known as: VITAMIN D2 Take 50,000 Units by mouth every Monday. Monday   hydrALAZINE 25 MG tablet Commonly known as: APRESOLINE Take 1 tablet (25 mg total) by mouth 3 (three) times daily.   rosuvastatin 5 MG tablet Commonly known as: CRESTOR Take 1 tablet (5 mg total) by mouth daily.   sodium bicarbonate 650 MG tablet Take 1 tablet (650 mg total) by mouth 2 (two) times daily.   torsemide 20 MG tablet Commonly known as: DEMADEX Take 1 tablet (20 mg total) by mouth daily.   TRUEplus Lancets 33G Misc USE 4 (FOUR) TIMES DAILY   Vyndamax 61 MG Caps Generic drug: Tafamidis Take 61 mg by mouth daily.        Allergies:  Allergies  Allergen Reactions   Hydrochlorothiazide Other (See Comments)    Gout , uncontrolled diabetes and renal insufficiency    Past Medical History:  Diagnosis Date   Cataract    CHF (congestive heart failure) (HCC)    Chronic heart failure with preserved ejection fraction (HFpEF) (HCC)    Colon polyp 2003   Dr [name]; F/U was to be 2008( not completed)   Coronary artery disease    CVA (cerebral infarction) 2014   Diabetes mellitus    Diverticulosis 2003   Dyspnea    with exertion   Gout    Hereditary cardiac amyloidosis (HCC)    Hypertension    Myocardial infarction ([place]) 09/19/2012   PEA cardiac arrest in setting of acute respiratory failure/pulmonary edema   OSA (obstructive sleep apnea) 03/27/2018   Pneumonia 09/29/2011   Avelox X 10 days as OP   Prostate cancer ([place])    Renal insufficiency    Seizures ([place]) 09/19/2012   not treated for seizure disorder; had a seizure after  stroke 2014; no seizure since then   Stroke [facility])     Past Surgical History:  Procedure Laterality Date   A/V FISTULAGRAM Left 04/28/2022   Procedure:  A/V Fistulagram;  Surgeon: Angelia Mould, MD;  Location: [place] [facility];  Service: Cardiovascular;  Laterality: Left;   AV FISTULA PLACEMENT Left 01/31/2022   Procedure: LEFT ARM ARTERIOVENOUS (AV) FISTULA CREATION;  Surgeon: Waynetta Sandy, MD;  Location: [place];  Service: Vascular;  Laterality: Left;  regional block to left arm   CARDIOVERSION N/A 03/24/2022   Procedure: CARDIOVERSION;  Surgeon: Hebert Soho, DO;  Location: [place] [facility];  Service: Cardiovascular;  Laterality: N/A;   COLONOSCOPY W/ POLYPECTOMY  2003   no F/U ([place] discussed 12/03/12)   INGUINAL HERNIA REPAIR Left 06/16/2021   Procedure: LAPAROSCOPIC, POSSIBLY OPEN LEFT INGUINAL HERNIA REPAIR WITH MESH;  Surgeon: Felicie Morn, MD;  Location: [facility];  Service: General;  Laterality: Left;   INSERTION OF MESH N/A 07/16/2017   Procedure: INSERTION OF MESH;  Surgeon: [name], MD;  Location: [place];  Service: General;  Laterality: N/A;   PEG PLACEMENT  2014   10/08/12-12/10/12   PEG TUBE REMOVAL  2014   PERIPHERAL VASCULAR BALLOON ANGIOPLASTY  04/28/2022   Procedure: PERIPHERAL VASCULAR BALLOON ANGIOPLASTY;  Surgeon: Angelia Mould, MD;  Location: [place] [facility];  Service: Cardiovascular;;   PROSTATE BIOPSY     RIGHT HEART CATH N/A 03/24/2022   Procedure: RIGHT HEART CATH;  Surgeon: Hebert Soho, DO;  Location: [place] [facility];  Service: Cardiovascular;  Laterality: N/A;   RIGHT/LEFT HEART CATH AND CORONARY ANGIOGRAPHY N/A 09/20/2020   Procedure: RIGHT/LEFT HEART CATH AND CORONARY ANGIOGRAPHY;  Surgeon: Leonie Man, MD;  Location: [place] [facility];  Service: Cardiovascular;  Laterality: N/A;   TEE WITHOUT CARDIOVERSION N/A 09/27/2020   Procedure: TRANSESOPHAGEAL ECHOCARDIOGRAM (TEE);  Surgeon: Wonda Olds,  MD;  Location[facility];  Service: Thoracic;  Laterality: N/A;   TEE WITHOUT CARDIOVERSION N/A 03/24/2022   Procedure: TRANSESOPHAGEAL ECHOCARDIOGRAM (TEE);  Surgeon: Hebert Soho, DO;  Location: [place] [facility];  Service: Cardiovascular;  Laterality: N/A;   TRACHEOSTOMY  2014   09/30/12-10/20/12   VENTRAL HERNIA REPAIR N/A 07/16/2017   Procedure: LAPAROSCOPIC VENTRAL HERNIA REPAIR WITH MESH;  Surgeon: [name], MD;  Location: [place];  Service: General;  Laterality: N/A;   wrist aspiration  02/16/2012    monosodium urate crystals; Dr Caralyn Guile    Family History  Problem Relation Age of Onset   Heart failure Mother    Hypertension Mother    Diabetes Mother    Hypertension Father    Prostate cancer Father    Diabetes Father    Cancer Father    Heart failure Brother    Stroke Neg Hx    Heart disease Neg Hx    Colon cancer Neg Hx     Social History:  reports that he quit smoking about 54 years ago. His smoking use included cigarettes. He has a 0.25 pack-year smoking history. He has never used smokeless tobacco. He reports that he does not drink alcohol and does not use drugs.    Review of Systems   Lipids: He has been started on Crestor 40 mg daily possibly because of his cardiac history  Lab Results  Component Value Date   CHOL 86 03/23/2022   HDL 44 03/23/2022   LDLCALC 31 03/23/2022   TRIG 54 03/23/2022   CHOLHDL 2.0 03/23/2022    Eye exams: Last done in 1/20 without retinopathy     He has renal insufficiency of unclear etiology with variable creatinine levels as follows: He has had a shunt  in place on the left side  Lab Results  Component Value Date   CREATININE 3.64 (H) 05/11/2022   CREATININE 3.70 (H) 04/28/2022   CREATININE 5.31 (H) 03/29/2022     HYPERTENSION: on losartan 50 mg, amlodipine , clonidine 0.3 mg and hydralazine as well as metoprolol  Currently managed by PCP and cardiologist   He takes DEMADEX 20 mg   BP Readings from Last 3  Encounters:  05/16/22 132/80  04/28/22 (!) 140/83  04/18/22 (!) 159/89    Foot exam done in 9/22   Previously he has had significant foot deformities and podiatrist has requested diabetic shoes    Physical Examination:  BP 132/80 (BP Location: Left Arm, Patient Position: Sitting, Cuff Size: Normal)   Ht '5\' 6"'$  (1.676 m)   Wt 187 lb 3.2 oz (84.9 kg)   BMI 30.21 kg/m       ASSESSMENT/PLAN:  Diabetes type 2, uncontrolled   See history of present illness for discussion of current diabetes management, blood sugar patterns and problems identified  A1c is only 6% Previously fructosamine has been higher  He is on no medications at this time for the last month  However blood sugars appear not to be going up possibly because of his renal insufficiency   Since he is having difficulty affording the Januvia he will hold off on any medication for now and start monitoring blood sugar, new meter given  There are no Patient Instructions on file for this visit.   Elayne Snare 05/16/2022, 11:22 AM

## 2022-05-17 ENCOUNTER — Other Ambulatory Visit: Payer: Medicare HMO

## 2022-05-17 DIAGNOSIS — Z515 Encounter for palliative care: Secondary | ICD-10-CM

## 2022-05-17 NOTE — Progress Notes (Signed)
[facility] Palliative Care Encounter Note  PATIENT NAME: Corey [name] DOB: 1945/11/08 MRN: [id number]  PRIMARY CARE PROVIDER: [name], MD  RESPONSIBLE PARTY:  Acct ID - Guarantor Home Phone Work Phone Relationship Acct Type  [id number] Corey [name], Corey [name] [phone]  Self P/F     [address] [place], [place] [address]    RN/SW team completed home visit. Wife was present.   HISTORY OF PRESENT ILLNESS:  77yo male with DMII, CHF and CKD referred for palliative care.   Palliative Care/Hospice: RN/SW explained purpose for palliative care and detailed differences between palliative care and hospice.   Appetite: Pt reports having a good appetite. Eats most of all meals, either 2 or 3 per day. Also reports that he monitors sodium content in food and spouse uses Ms Deliah Boston to season pt food. Wife reports that pt is on a fluid restriction, "he can only have 5 small glasses of fluid per day, but he don't stick to that. He drinks more than he should".  RN discussed importance of staying within fluid limits set by cardiology. Also discussed being vigilant about sodium intake.  Mobility: Pt noted to walk independently without aid of cane or walker when he greeted RN/SW at door for visit. Gait steady and no limping or weakness noted in lower extremities.  GI/GU: pt denies any issues with either. Does make urine. Reports urine as dark yellow in color, no odor.  CODE STATUS: Full Code ADVANCED DIRECTIVES: N MOST FORM: No PPS:   Information packet for palliative care along with [place] form, HCPOA, and Advanced directive information given to pt by SW with explanation given to both pt and spouse.  PHYSICAL EXAM:   VITALS: 122/76, HR-92, RR-21, O2 sat- 94% R/A LUNGS: CTA bilaterally with diminished bases CARDIAC: Regular, +JVD. Pt does report that he weighs himself daily. Encouraged to keep a recording of weights and to notify MD if weight gain >3 pounds. EXTREMITIES: Edema noted to left hand 2-3+.  Good strength and PNS normal. Edema noted to right leg, 2+ with pedal pulses present.  SKIN: Pt has been treated for ulceration to right leg. Spouse reports that he only has one more visit. Right lower leg wrapped in dressing.  NEURO: Alert and oriented x 3. Answers questions appropriately.  Next appt scheduled for Jun 13, 2022 at [place], [place]

## 2022-05-18 ENCOUNTER — Encounter (HOSPITAL_BASED_OUTPATIENT_CLINIC_OR_DEPARTMENT_OTHER): Payer: Medicare HMO | Attending: Internal Medicine | Admitting: Internal Medicine

## 2022-05-18 DIAGNOSIS — L97918 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 with other specified severity: Secondary | ICD-10-CM | POA: Insufficient documentation

## 2022-05-18 DIAGNOSIS — I5042 Chronic combined systolic (congestive) and diastolic (congestive) heart failure: Secondary | ICD-10-CM | POA: Diagnosis not present

## 2022-05-18 DIAGNOSIS — I87311 Chronic venous hypertension (idiopathic) with ulcer of right lower extremity: Secondary | ICD-10-CM | POA: Insufficient documentation

## 2022-05-18 DIAGNOSIS — E11622 Type 2 diabetes mellitus with other skin ulcer: Secondary | ICD-10-CM | POA: Diagnosis not present

## 2022-05-18 DIAGNOSIS — Z87891 Personal history of nicotine dependence: Secondary | ICD-10-CM | POA: Insufficient documentation

## 2022-05-18 DIAGNOSIS — E1122 Type 2 diabetes mellitus with diabetic chronic kidney disease: Secondary | ICD-10-CM | POA: Diagnosis not present

## 2022-05-18 DIAGNOSIS — I130 Hypertensive heart and chronic kidney disease with heart failure and stage 1 through stage 4 chronic kidney disease, or unspecified chronic kidney disease: Secondary | ICD-10-CM | POA: Insufficient documentation

## 2022-05-18 DIAGNOSIS — I25119 Atherosclerotic heart disease of native coronary artery with unspecified angina pectoris: Secondary | ICD-10-CM | POA: Diagnosis not present

## 2022-05-18 DIAGNOSIS — N184 Chronic kidney disease, stage 4 (severe): Secondary | ICD-10-CM | POA: Insufficient documentation

## 2022-05-18 NOTE — Progress Notes (Signed)
ROMELLO, HOEHN ([id number]) [id number] Page 1 of 8 Visit Report for 03/13/2022 Arrival Information Details Patient Name: Date of Service: Corey [name]. 03/13/2022 11:00 A M Medical Record Number: [id number] Patient Account Number: [id number] Date of Birth/Sex: Treating RN: 10/28/1945 (77 y.o. Corey [name] Primary Care Field Staniszewski: Billey Gosling Other Clinician: Referring Venecia Mehl: Treating Madoline Bhatt/Extender: Mickle Asper in Treatment: 18 Visit Information History Since Last Visit Added or deleted any medications: No Patient Arrived: Ambulatory Any new allergies or adverse reactions: No Arrival Time: 11:15 Had a fall or experienced change in No Accompanied By: son activities of daily living that may affect Transfer Assistance: None risk of falls: Patient Identification Verified: Yes Signs or symptoms of abuse/neglect since last visito No Secondary Verification Process Completed: Yes Hospitalized since last visit: No Patient Requires Transmission-Based No Implantable device outside of the clinic excluding No Precautions: cellular tissue based products placed in the center Patient Has Alerts: Yes since last visit: Patient Alerts: in clinic R [place] Has Dressing in Place as Prescribed: Yes 11/17/2021 ABIs: both [place] Has Compression in Place as Prescribed: Yes 11/17/2021 TBI: R0.64 L0.58 Pain Present Now: No Electronic Signature(s) Signed: 03/13/2022 4:53:44 PM By: Deon Pilling RN, BSN Entered By: Deon Pilling on 03/13/2022 11:24:27 -------------------------------------------------------------------------------- Compression Therapy Details Patient Name: Date of Service: Corey [name], [name] 03/13/2022 11:00 A M Medical Record Number: [id number] Patient Account Number: [id number] Date of Birth/Sex: Treating RN: 1946-02-19 (77 y.o. [name] Primary Care Christmas Faraci: Billey Gosling Other Clinician: Referring Patton Swisher: Treating  Jaelle Campanile/Extender: Mickle Asper in Treatment: 18 Compression Therapy Performed for Wound Assessment: Wound #1 Right,Lateral Lower Leg Performed By: Clinician Rhae Hammock, RN Compression Type: Three Layer Post Procedure Diagnosis Same as Pre-procedure Electronic Signature(s) Signed: 05/17/2022 5:36:43 PM By: Rhae Hammock RN Entered By: Rhae Hammock on 03/13/2022 11:45:05 Corey [name] ([id number]) [id number] Page 2 of 8 -------------------------------------------------------------------------------- Encounter Discharge Information Details Patient Name: Date of Service: Corey [name]. 03/13/2022 11:00 A M Medical Record Number: [id number] Patient Account Number: [id number] Date of Birth/Sex: Treating RN: 1945/06/09 (77 y.o. [name] Primary Care Jakell Trusty: Billey Gosling Other Clinician: Referring Tameca Jerez: Treating Letasha Kershaw/Extender: Mickle Asper in Treatment: 32 Encounter Discharge Information Items Post Procedure Vitals Discharge Condition: Stable Temperature (F): 98.1 Ambulatory Status: Ambulatory Pulse (bpm): 74 Discharge Destination: Home Respiratory Rate (breaths/min): 17 Transportation: Private Auto Blood Pressure (mmHg): 136/77 Accompanied By: son Schedule Follow-up Appointment: Yes Clinical Summary of Care: Patient Declined Electronic Signature(s) Signed: 05/17/2022 5:36:43 PM By: Rhae Hammock RN Entered By: Rhae Hammock on 03/13/2022 11:47:35 -------------------------------------------------------------------------------- Lower Extremity Assessment Details Patient Name: Date of Service: Corey [name], [name] 03/13/2022 11:00 A M Medical Record Number: [id number] Patient Account Number: [id number] Date of Birth/Sex: Treating RN: [birth year] (77 y.o. Corey [name] Primary Care Satya Bohall: Billey Gosling Other Clinician: Referring Saed Hudlow: Treating Bronsen Serano/Extender:  [name] in Treatment: 18 Edema Assessment Assessed: [Left: No] [Right: Yes] Edema: [Left: Ye] [Right: s] Calf Left: Right: Point of Measurement: 33 cm From Medial Instep 32.5 cm Ankle Left: Right: Point of Measurement: 11 cm From Medial Instep 21 cm Vascular Assessment Pulses: Dorsalis Pedis Palpable: [Right:Yes] Electronic Signature(s) Signed: 03/13/2022 4:53:44 PM By: Deon Pilling RN, BSN Entered By: Deon Pilling on 03/13/2022 11:25:00 Multi Wound Chart Details -------------------------------------------------------------------------------- Corey [name] ([id number]) [id number] Page 3 of 8 Patient Name: Date of Service: Corey [name] NCE, [place]. 03/13/2022 11:00  A M Medical Record Number: [id number] Patient Account Number: [id number] Date of Birth/Sex: Treating RN: 1945/06/21 (77 y.o. M) Primary Care Jocabed Cheese: Billey Gosling Other Clinician: Referring Jeris Easterly: Treating Larrie Fraizer/Extender: Mickle Asper in Treatment: 18 Vital Signs Height(in): 66 Pulse(bpm): 57 Weight(lbs): 145 Blood Pressure(mmHg): 136/84 Body Mass Index(BMI): 23.4 Temperature(F): 97.8 Respiratory Rate(breaths/min): 16 Wound Assessments Wound Number: 1 N/A N/A Photos: N/A N/A Right, Lateral Lower Leg N/A N/A Wound Location: Gradually Appeared N/A N/A Wounding Event: Diabetic Wound/Ulcer of the Lower N/A N/A Primary Etiology: Extremity Cataracts, Sleep Apnea, Congestive N/A N/A Comorbid History: Heart Failure, Hypertension, Myocardial Infarction, Type II Diabetes, Gout 08/15/2021 N/A N/A Date Acquired: 18 N/A N/A Weeks of Treatment: Open N/A N/A Wound Status: No N/A N/A Wound Recurrence: 5x2.5x0.1 N/A N/A Measurements L x W x D (cm) 9.817 N/A N/A A (cm) : rea 0.982 N/A N/A Volume (cm) : 54.50% N/A N/A % Reduction in A rea: 54.50% N/A N/A % Reduction in Volume: Grade 2 N/A N/A Classification: Medium N/A  N/A Exudate A mount: Serosanguineous N/A N/A Exudate Type: red, brown N/A N/A Exudate Color: Distinct, outline attached N/A N/A Wound Margin: Large (67-100%) N/A N/A Granulation A mount: Red, Pink N/A N/A Granulation Quality: Small (1-33%) N/A N/A Necrotic A mount: Fat Layer (Subcutaneous Tissue): Yes N/A N/A Exposed Structures: Fascia: No Tendon: No Muscle: No Joint: No Bone: No Large (67-100%) N/A N/A Epithelialization: Debridement - Excisional N/A N/A Debridement: Pre-procedure Verification/Time Out 11:45 N/A N/A Taken: Lidocaine N/A N/A Pain Control: Subcutaneous, Slough N/A N/A Tissue Debrided: Skin/Subcutaneous Tissue N/A N/A Level: 12.5 N/A N/A Debridement A (sq cm): rea Curette N/A N/A Instrument: Minimum N/A N/A Bleeding: Pressure N/A N/A Hemostasis A chieved: 0 N/A N/A Procedural Pain: 0 N/A N/A Post Procedural Pain: Procedure was tolerated well N/A N/A Debridement Treatment Response: 5x2.5x0.1 N/A N/A Post Debridement Measurements L x W x D (cm) 0.982 N/A N/A Post Debridement Volume: (cm) Scarring: Yes N/A N/A Periwound Skin Texture: Excoriation: No Induration: No Callus: No Crepitus: No Rash: No Maceration: No N/A N/A Periwound Skin Moisture: Dry/Scaly: No Atrophie Blanche: No N/A N/A Periwound Skin ColorNNAMDI, DACUS ([id number]) [id number] Page 4 of 8 Cyanosis: No Ecchymosis: No Erythema: No Hemosiderin Staining: No Mottled: No Pallor: No Rubor: No No Abnormality N/A N/A Temperature: Yes N/A N/A Tenderness on Palpation: Compression Therapy N/A N/A Procedures Performed: Debridement Treatment Notes Wound #1 (Lower Leg) Wound Laterality: Right, Lateral Cleanser Soap and Water Discharge Instruction: May shower and wash wound with dial antibacterial soap and water prior to dressing change. Wound Cleanser Discharge Instruction: Cleanse the wound with wound cleanser prior to applying a clean  dressing using gauze sponges, not tissue or cotton balls. Peri-Wound Care Topical [facility]-topical compounding antibiotics Discharge Instruction: apply directly to wound bed. Primary Dressing Fibracol Plus Dressing, 4x4.38 in (collagen) Discharge Instruction: Moisten collagen with saline or hydrogel until the blastx and powder collagen arrive. Secondary Dressing ABD Pad, 8x10 Discharge Instruction: Apply over primary dressing as directed. Woven Gauze Sponge, Non-Sterile 4x4 in Discharge Instruction: Apply over primary dressing as directed. Secured With Compression Wrap ThreePress (3 layer compression wrap) Discharge Instruction: Apply three layer compression as directed. Compression Stockings Add-Ons Electronic Signature(s) Signed: 03/13/2022 2:03:41 PM By: Kalman Shan DO Entered By: Kalman Shan on 03/13/2022 11:53:39 -------------------------------------------------------------------------------- Multi-Disciplinary Care Plan Details Patient Name: Date of Service: Corey [name], [name] 03/13/2022 11:00 A M Medical Record Number: [id number] Patient Account Number: [id number] Date of Birth/Sex: Treating RN: Oct 02, 1945 [phone]  y.o. [name] Primary Care Elbia Paro: Billey Gosling Other Clinician: Referring Marvie Brevik: Treating Charleigh Correnti/Extender: Mickle Asper in Treatment: [address] Inactive Necrotic Tissue Corey [name], Corey [name] ([id number]) [id number] Page 5 of 8 Nursing Diagnoses: Knowledge deficit related to management of necrotic/devitalized tissue Goals: Necrotic/devitalized tissue will be minimized in the wound bed Date Initiated: 11/01/2021 Target Resolution Date: 03/18/2022 Goal Status: Active Patient/caregiver will verbalize understanding of reason and process for debridement of necrotic tissue Date Initiated: 11/01/2021 Date Inactivated: 12/05/2021 Target Resolution Date: 12/09/2021 Goal Status:  Met Interventions: Assess patient pain level pre-, during and post procedure and prior to discharge Provide education on necrotic tissue and debridement process Treatment Activities: Apply topical anesthetic as ordered : 11/01/2021 Enzymatic debridement : 11/01/2021 Excisional debridement : 11/01/2021 Notes: Pain, Acute or Chronic Nursing Diagnoses: Pain, acute or chronic: actual or potential Potential alteration in comfort, pain Goals: Patient will verbalize adequate pain control and receive pain control interventions during procedures as needed Date Initiated: 11/01/2021 Date Inactivated: 11/21/2021 Target Resolution Date: 12/09/2021 Goal Status: Met Patient/caregiver will verbalize comfort level met Date Initiated: 11/01/2021 Target Resolution Date: 03/18/2022 Goal Status: Active Interventions: Complete pain assessment as per visit requirements Provide education on pain management Treatment Activities: Administer pain control measures as ordered : 11/01/2021 Notes: Electronic Signature(s) Signed: 05/17/2022 5:36:43 PM By: Rhae Hammock RN Entered By: Rhae Hammock on 03/13/2022 11:35:55 -------------------------------------------------------------------------------- Pain Assessment Details Patient Name: Date of Service: Corey [name], [name] 03/13/2022 11:00 A M Medical Record Number: [id number] Patient Account Number: [id number] Date of Birth/Sex: Treating RN: 06/23/1945 (77 y.o. Corey [name] Primary Care Somaly Marteney: Billey Gosling Other Clinician: Referring Sela Falk: Treating Peytyn Trine/Extender: Mickle Asper in Treatment: 21 Active Problems Location of Pain Severity and Description of Pain Patient Has Paino No Site Locations Rate the pain. Corey, [name] ([id number]) [id number] Page 6 of 8 Rate the pain. Current Pain Level: 0 Pain Management and Medication Current Pain Management: Medication: No Cold Application:  No Rest: No Massage: No Activity: No T.E.N.S.: No Heat Application: No Leg drop or elevation: No Is the Current Pain Management Adequate: Adequate How does your wound impact your activities of daily livingo Sleep: No Bathing: No Appetite: No Relationship With Others: No Bladder Continence: No Emotions: No Bowel Continence: No Work: No Toileting: No Drive: No Dressing: No Hobbies: No Engineer, maintenance) Signed: 03/13/2022 4:53:44 PM By: Deon Pilling RN, BSN Entered By: Deon Pilling on 03/13/2022 11:24:52 -------------------------------------------------------------------------------- Patient/Caregiver Education Details Patient Name: Date of Service: Corey [name] 10/30/2023andnbsp11:00 A M Medical Record Number: [id number] Patient Account Number: [id number] Date of Birth/Gender: Treating RN: 02-20-1946 (77 y.o. [name] Primary Care Physician: Billey Gosling Other Clinician: Referring Physician: Treating Physician/Extender: Mickle Asper in Treatment: 85 Education Assessment Education Provided To: Patient Education Topics Provided Pain: Methods: Explain/Verbal Responses: Reinforcements needed, State content correctly Wound Debridement: Methods: Explain/Verbal Responses: Reinforcements needed, State content correctly Electronic Signature(s) Signed: 05/17/2022 5:36:43 PM By: Rhae Hammock RN [place],Signed: 05/17/2022 5:36:43 PM By: Rhae Hammock RN Maia Plan ([id number]) [id number] Page 7 of 8 Entered By: Rhae Hammock on 03/13/2022 11:36:08 -------------------------------------------------------------------------------- Wound Assessment Details Patient Name: Date of Service: Corey [name]. 03/13/2022 11:00 A M Medical Record Number: [id number] Patient Account Number: [id number] Date of Birth/Sex: Treating RN: 10-30-1945 (77 y.o. Corey [name] Primary Care Andreus Cure: Billey Gosling Other  Clinician: Referring Landynn Dupler: Treating Aveena Bari/Extender: [name] in Treatment: 18 Wound Status Wound Number:  1 Primary Diabetic Wound/Ulcer of the Lower Extremity Etiology: Wound Location: Right, Lateral Lower Leg Wound Open Wounding Event: Gradually Appeared Status: Date Acquired: 08/15/2021 Comorbid Cataracts, Sleep Apnea, Congestive Heart Failure, Hypertension, Weeks Of Treatment: 18 History: Myocardial Infarction, Type II Diabetes, Gout Clustered Wound: No Photos Wound Measurements Length: (cm) 5 Width: (cm) 2.5 Depth: (cm) 0.1 Area: (cm) 9.817 Volume: (cm) 0.982 % Reduction in Area: 54.5% % Reduction in Volume: 54.5% Epithelialization: Large (67-100%) Tunneling: No Undermining: No Wound Description Classification: Grade 2 Wound Margin: Distinct, outline attached Exudate Amount: Medium Exudate Type: Serosanguineous Exudate Color: red, brown Foul Odor After Cleansing: No Slough/Fibrino Yes Wound Bed Granulation Amount: Large (67-100%) Exposed Structure Granulation Quality: Red, Pink Fascia Exposed: No Necrotic Amount: Small (1-33%) Fat Layer (Subcutaneous Tissue) Exposed: Yes Necrotic Quality: Adherent Slough Tendon Exposed: No Muscle Exposed: No Joint Exposed: No Bone Exposed: No Periwound Skin Texture Texture Color No Abnormalities Noted: No No Abnormalities Noted: No Callus: No Atrophie Blanche: No Crepitus: No Cyanosis: No Excoriation: No Ecchymosis: No Induration: No Erythema: No Rash: No Hemosiderin Staining: No Scarring: Yes Mottled: No Pallor: No [address] Corey [name], Corey [name] ([id number]) [id number] Page 8 of 8 Rubor: No No Abnormalities Noted: No Dry / Scaly: No Temperature / Pain Maceration: No Temperature: No Abnormality Tenderness on Palpation: Yes Electronic Signature(s) Signed: 03/13/2022 4:53:44 PM By: Deon Pilling RN, BSN Entered By: Deon Pilling on 03/13/2022  11:26:28 -------------------------------------------------------------------------------- Vitals Details Patient Name: Date of Service: Corey [name], [name] 03/13/2022 11:00 A M Medical Record Number: [id number] Patient Account Number: [id number] Date of Birth/Sex: Treating RN: [birth year] (77 y.o. Corey [name] Primary Care Tamera Pingley: Billey Gosling Other Clinician: Referring Husain Costabile: Treating Cabrini Ruggieri/Extender: Mickle Asper in Treatment: 18 Vital Signs Time Taken: 11:15 Temperature (F): 97.8 Height (in): 66 Pulse (bpm): 57 Weight (lbs): 145 Respiratory Rate (breaths/min): 16 Body Mass Index (BMI): 23.4 Blood Pressure (mmHg): 136/84 Reference Range: 80 - 120 mg / dl Electronic Signature(s) Signed: 03/13/2022 4:53:44 PM By: Deon Pilling RN, BSN Entered By: Deon Pilling on 03/13/2022 11:24:45

## 2022-05-18 NOTE — Progress Notes (Signed)
Corey [name], Corey [name] ([id number]) [id number] Page 1 of 9 Visit Report for 05/18/2022 Chief Complaint Document Details Patient Name: Date of Service: Corey [name]. 05/18/2022 10:30 A [name] Medical Record Number: [id number] Patient Account Number: [id number] Date of Birth/Sex: Treating RN: 08-Dec-1945 (77 y.o. [name]) Primary Care Provider: Billey Gosling Other Clinician: Referring Provider: Treating Provider/Extender: Mickle Asper in Treatment: 28 Information Obtained from: Patient Chief Complaint 11/01/2021; right lower extremity wound Electronic Signature(s) Signed: 05/18/2022 2:54:58 PM By: Kalman Shan DO Entered By: Kalman Shan on 05/18/2022 12:41:52 -------------------------------------------------------------------------------- HPI Details Patient Name: Date of Service: Corey [name], Corey [name]. 05/18/2022 10:30 A [name] Medical Record Number: [id number] Patient Account Number: [id number] Date of Birth/Sex: Treating RN: 18-May-1945 (77 y.o. [name]) Primary Care Provider: Billey Gosling Other Clinician: Referring Provider: Treating Provider/Extender: Mickle Asper in Treatment: 28 History of Present Illness HPI Description: Admission 11/01/2021 Corey [name] Is a 77 year old male with a past medical history of of type 2 diabetes, coronary artery disease, stage IV kidney disease acute on chronic combined systolic and diastolic heart failure that presents to the clinic for a 62-monthhistory of right lower extremity wound. He states he is not sure how this started. It is completely eschared. He reports his wound has been stable for the past month. He has been keeping the area covered. He has mild chronic pain. He denies signs of infection. He states he has a history of lower extremity ulcers last being on the left leg. He could not tell me how this was treated in the past. 6/27; patient presents for follow-up. He is scheduled for his lower  extremity arterial studies on July 6th. He has been using Medihoney to the wound bed. He has no issues or complaints today. He denies signs of infection. 7/6; patient presents for follow-up. He has home health that reported the wound having odor and increased pain last week. He was sent to urgent care. He was started on doxycycline. He is continuing Medihoney. He has his scheduled ABIs w/TBIs today. 7/10; patient presents for follow-up. He had his arterial studies done last week. He had triphasic waveforms throughout the right lower extremity. His TBI was 0.64. He has good pedal pulses. He is almost done with his course of antibiotics. He has been using mupirocin ointment to the wound bed. 7/17; patient presents for follow-up. He has been using Iodosorb under compression therapy. He has no issues or complaints today. He states he is receiving Keystone antibiotic tomorrow In the mail. 7/24; patient presents for follow-up. He has been doing Dakin's wet-to-dry dressings to the wound bed daily without issues. He currently denies signs of infection. He reports completing his course of doxycycline. He has his Keystone antibiotic with him today. 7/31; patient presents for follow-up. We have been using Iodoflex with [facility] therapy. He has no issues or complaints today. He denies signs of infection. 8/7; patient presents for follow-up. We have been using Iodoflex with [facility]. Home health came out once last week to change the wrap. He has no issues or complaints today. He denies signs of infection. 8/14; patient presents for follow-up. We have been using Keystone with Hydrofera Blue under compression therapy. He has tolerated this well. He has no issues or complaints today. KKREGG, CIHLAR([id number] Page 2 of 9 8/21; patient presents for follow-up. We have been using Keystone with Hydrofera Blue under compression therapy.  The Hydrofera Blue was stuck  on tightly to the wound bed today. Other than that he has no issues or complaints. We have not heard back for insurance approval for skin sub. 8/28; patient presents for follow-up. We ordered blast X and collagen powder at last clinic visit. It is unclear if he has received this. We placed Keystone with Fibracol under 3 layer compression last week. Patient has home health. Patient has no issues or complaints today. He was not approved for TheraSkin. 9/5; patient presents for follow-up. Insurance would not cover blast X. We do have a sample and he would like to try this. We have been using Keystone antibiotic and collagen under 3 layer compression. He has no issues or complaints today. 9/18; patient presents for follow-up. We have been using collagen with blast X and Keystone under compression therapy. He has no issues or complaints today. 9/25; patient presents for follow-up. We have continued to use collagen, blast X and Keystone under compression therapy. Patient has no issues or complaints today. 10/6; patient presents for follow-up. We have been using collagen, blast X and Keystone under compression therapy. He denies signs of infection and has no issues or complaints today. Patient has home health that comes out on Wednesdays and Fridays. 10/16; patient presents for follow-up. He has been using collagen, blast X and Keystone with benefit to the wound bed under compression therapy. He is about to run out of [facility] antibiotic ointment. He has no issues or complaints today. 10/23; patient presents for follow-up. We continue to use collagen, blast X and Keystone under compression therapy. Continued report in wound healing. Home health continues to come out to do dressing changes. Patient has no issues or complaints today. 10/30; patient presents for follow-up. We have been using collagen, blast X and Keystone antibiotic under compression therapy. His wound continues to  show improvement in healing. He has no issues or complaints today. 11/30; patient was last seen 1 month ago. He has missed his last clinic visit. He has home health who changes the dressings 3 times weekly. He has been using collagen with Keystone antibiotic under 3 layer compression. He has run out of [place] antibiotic ointment. The wound is almost healed. 12/18; patient was last seen 3 weeks ago. We have been using collagen with Keystone antibiotic under 3 layer compression. Home health has been coming out twice a week. He has no issues or complaints today. 1/4; patient presents for follow-up. We have been using collagen under 3 layer compression to the right lower extremity. Home health has been coming out twice weekly to change the dressing. He states the wrap slid down this past week. Electronic Signature(s) Signed: 05/18/2022 2:54:58 PM By: Kalman Shan DO Entered By: Kalman Shan on 05/18/2022 12:44:10 -------------------------------------------------------------------------------- Physical Exam Details Patient Name: Date of Service: Corey [name], Corey [name]. 05/18/2022 10:30 A [name] Medical Record Number: [id number] Patient Account Number: [id number] Date of Birth/Sex: Treating RN: 11-Jan-1946 (77 y.o. [name]) Primary Care Provider: Billey Gosling Other Clinician: Referring Provider: Treating Provider/Extender: Mickle Asper in Treatment: 28 Constitutional respirations regular, non-labored and within target range for patient.. Cardiovascular 2+ dorsalis pedis/posterior tibialis pulses. Psychiatric pleasant and cooperative. Notes Right lower extremity: T the anterior aspect there is an open wound with granulation tissue. No surrounding signs of infection. 2+ pitting edema to the knee. o Electronic Signature(s) Signed: 05/18/2022 2:54:58 PM By: Kalman Shan DO Entered By: Kalman Shan on 05/18/2022 12:44:24 Corey [name] ([id number])  [id number] Page 3 of 9 -------------------------------------------------------------------------------- Physician  Orders Details Patient Name: Date of Service: Corey [name]. 05/18/2022 10:30 A [name] Medical Record Number: [id number] Patient Account Number: [id number] Date of Birth/Sex: Treating RN: August 09, 1945 (77 y.o. [name] Primary Care Provider: Billey Gosling Other Clinician: Referring Provider: Treating Provider/Extender: Mickle Asper in Treatment: 74 Verbal / Phone Orders: No Diagnosis Coding Follow-up Appointments ppointment in 2 weeks. - w/ Dr. Heber Weatherford Return A Anesthetic (In clinic) Topical Lidocaine 5% applied to wound bed (In clinic) Topical Lidocaine 4% applied to wound bed Cellular or Tissue Based Products Cellular or Tissue Based Product Type: - run insurance for [employer], epicord, and organogenesis advance tissue product. Theraskin not approved. Epicord not covered. Organ[employer] Apligraf approved. Edema Control - Lymphedema / SCD / Other Avoid standing for long periods of time. Patient to wear own compression stockings every day. - We will apply a tubi grip size E double folded today Home Health New wound care orders this week; continue Home Health for wound care. May utilize formulary equivalent dressing for wound treatment orders unless otherwise specified. Alvis Lemmings to change 2 x a week. Change dressing to hydraferablue. Other Home Health Orders/Instructions: - [facility] Wound Treatment Wound #1 - Lower Leg Wound Laterality: Right, Lateral Cleanser: Soap and Water (Home Health) 3 x Per Week/30 Days Discharge Instructions: May shower and wash wound with dial antibacterial soap and water prior to dressing change. Cleanser: Wound Cleanser (Home Health) 3 x Per Week/30 Days Discharge Instructions: Cleanse the wound with wound cleanser prior to applying a clean dressing using gauze sponges, not  tissue or cotton balls. Topical: Gentamicin 3 x Per Week/30 Days Discharge Instructions: in clinic only Topical: Mupirocin Ointment 3 x Per Week/30 Days Discharge Instructions: in clinic only Prim Dressing: Hydrofera Blue Ready Transfer Foam, 4x5 (in/in) (Home Health) 3 x Per Week/30 Days ary Discharge Instructions: Apply to wound bed as instructed Secondary Dressing: ABD Pad, 8x10 ([place]) 3 x Per Week/30 Days Discharge Instructions: Apply over primary dressing as directed. Secondary Dressing: Woven Gauze Sponge, Non-Sterile 4x4 in (Home Health) 3 x Per Week/30 Days Discharge Instructions: Apply over primary dressing as directed. Compression Wrap: ThreePress (3 layer compression wrap) (Home Health) 3 x Per Week/30 Days Discharge Instructions: Apply three layer compression as directed. Electronic Signature(s) Signed: 05/18/2022 2:54:58 PM By: Kalman Shan DO Entered By: Kalman Shan on 05/18/2022 12:44:31 Corey [name] ([id number]) [id number] Page 4 of 9 -------------------------------------------------------------------------------- Problem List Details Patient Name: Date of Service: Corey [name]. 05/18/2022 10:30 A [name] Medical Record Number: [id number] Patient Account Number: [id number] Date of Birth/Sex: Treating RN: Sep 16, 1945 (77 y.o. [name]) Primary Care Provider: Billey Gosling Other Clinician: Referring Provider: Treating Provider/Extender: Mickle Asper in Treatment: 51 Active Problems ICD-10 Encounter Code Description Active Date MDM Diagnosis L97.918 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 with other 11/01/2021 No Yes specified severity E11.622 Type 2 diabetes mellitus with other skin ulcer 11/01/2021 No Yes I50.42 Chronic combined systolic (congestive) and diastolic (congestive) heart failure 11/01/2021 No Yes I25.119 Atherosclerotic heart disease of native coronary artery with unspecified  angina 11/01/2021 No Yes pectoris I87.311 Chronic venous hypertension (idiopathic) with ulcer of right lower extremity 12/26/2021 No Yes Inactive Problems Resolved Problems Electronic Signature(s) Signed: 05/18/2022 2:54:58 PM By: Kalman Shan DO Entered By: Kalman Shan on 05/18/2022 12:41:36 -------------------------------------------------------------------------------- Progress Note Details Patient Name: Date of Service: Corey [name], Corey [name]. 05/18/2022 10:30 A [name] Medical Record Number: [id number]  Patient Account Number: [id number] Date of Birth/Sex: Treating RN: [birth year] (77 y.o. [name]) Primary Care Provider: Billey Gosling Other Clinician: Referring Provider: Treating Provider/Extender: Mickle Asper in Treatment: 39 Subjective Chief Complaint Information obtained from Patient 11/01/2021; right lower extremity wound Corey [name], Corey [name] ([id number]) [id number] Page 5 of 9 History of Present Illness (HPI) Admission 11/01/2021 Mr. Mando Blatz Is a 77 year old male with a past medical history of of type 2 diabetes, coronary artery disease, stage IV kidney disease acute on chronic combined systolic and diastolic heart failure that presents to the clinic for a 23-monthhistory of right lower extremity wound. He states he is not sure how this started. It is completely eschared. He reports his wound has been stable for the past month. He has been keeping the area covered. He has mild chronic pain. He denies signs of infection. He states he has a history of lower extremity ulcers last being on the left leg. He could not tell me how this was treated in the past. 6/27; patient presents for follow-up. He is scheduled for his lower extremity arterial studies on July 6th. He has been using Medihoney to the wound bed. He has no issues or complaints today. He denies signs of infection. 7/6; patient presents for follow-up. He has home health that reported the  wound having odor and increased pain last week. He was sent to urgent care. He was started on doxycycline. He is continuing Medihoney. He has his scheduled ABIs w/TBIs today. 7/10; patient presents for follow-up. He had his arterial studies done last week. He had triphasic waveforms throughout the right lower extremity. His TBI was 0.64. He has good pedal pulses. He is almost done with his course of antibiotics. He has been using mupirocin ointment to the wound bed. 7/17; patient presents for follow-up. He has been using Iodosorb under compression therapy. He has no issues or complaints today. He states he is receiving Keystone antibiotic tomorrow In the mail. 7/24; patient presents for follow-up. He has been doing Dakin's wet-to-dry dressings to the wound bed daily without issues. He currently denies signs of infection. He reports completing his course of doxycycline. He has his Keystone antibiotic with him today. 7/31; patient presents for follow-up. We have been using Iodoflex with [facility] therapy. He has no issues or complaints today. He denies signs of infection. 8/7; patient presents for follow-up. We have been using [facility]lex [facility]. Home health came out once last week to change the wrap. He has no issues or complaints today. He denies signs of infection. 8/14; patient presents for follow-up. We have been using Keystone with Hydrofera Blue under compression therapy. He has tolerated this well. He has no issues or complaints today. 8/21; patient presents for follow-up. We have been using Keystone with Hydrofera Blue under compression therapy. The Hydrofera Blue was stuck on tightly to the wound bed today. Other than that he has no issues or complaints. We have not heard back for insurance approval for skin sub. 8/28; patient presents for follow-up. We ordered blast X and collagen powder at last clinic visit. It is unclear if he has received  this. We placed Keystone with Fibracol under 3 layer compression last week. Patient has home health. Patient has no issues or complaints today. He was not approved for TheraSkin. 9/5; patient presents for follow-up. Insurance would not cover blast X. We do have a sample and he would like to try this. We have been using Keystone  antibiotic and collagen under 3 layer compression. He has no issues or complaints today. 9/18; patient presents for follow-up. We have been using collagen with blast X and Keystone under compression therapy. He has no issues or complaints today. 9/25; patient presents for follow-up. We have continued to use collagen, blast X and Keystone under compression therapy. Patient has no issues or complaints today. 10/6; patient presents for follow-up. We have been using collagen, blast X and Keystone under compression therapy. He denies signs of infection and has no issues or complaints today. Patient has home health that comes out on Wednesdays and Fridays. 10/16; patient presents for follow-up. He has been using collagen, blast X and Keystone with benefit to the wound bed under compression therapy. He is about to run out of [facility] antibiotic ointment. He has no issues or complaints today. 10/23; patient presents for follow-up. We continue to use collagen, blast X and Keystone under compression therapy. Continued report in wound healing. Home health continues to come out to do dressing changes. Patient has no issues or complaints today. 10/30; patient presents for follow-up. We have been using collagen, blast X and Keystone antibiotic under compression therapy. His wound continues to show improvement in healing. He has no issues or complaints today. 11/30; patient was last seen 1 month ago. He has missed his last clinic visit. He has home health who changes the dressings 3 times weekly. He has been using collagen with Keystone antibiotic under 3 layer compression. He has run out  of [place] antibiotic ointment. The wound is almost healed. 12/18; patient was last seen 3 weeks ago. We have been using collagen with Keystone antibiotic under 3 layer compression. Home health has been coming out twice a week. He has no issues or complaints today. 1/4; patient presents for follow-up. We have been using collagen under 3 layer compression to the right lower extremity. Home health has been coming out twice weekly to change the dressing. He states the wrap slid down this past week. Patient History Family History Cancer - Father, Diabetes - Mother,Father, Heart Disease - Mother,Siblings, Hypertension - Mother,Father. Social History Former smoker - quit 1970, Marital Status - Married, Drug Use - No History, Caffeine Use - Moderate - coffee. Medical History Eyes Patient has history of Cataracts Respiratory Patient has history of Sleep Apnea - has CPAP does not wear Cardiovascular Patient has history of Congestive Heart Failure, Hypertension, Myocardial Infarction - 2014 Endocrine Patient has history of Type II Diabetes Musculoskeletal Patient has history of Gout Neurologic Denies history of Seizure Disorder Hospitalization/Surgery History - 06/2021 inguinal hernia repair. - cardioversion 09/2020. - ventral hernia repair 3/19. Corey [name], Corey [name] ([id number]) [id number] Page 6 of 9 Medical A Surgical History Notes nd Constitutional Symptoms (General Health) CVA 2014 bilateral hearing loss Gastrointestinal Diverticulosis Genitourinary Renal insufficiency 20/ Oncologic Prostate Ca Objective Constitutional respirations regular, non-labored and within target range for patient.. Vitals Time Taken: 11:00 AM, Height: 66 in, Weight: 145 lbs, BMI: 23.4, Temperature: 98.3 F, Pulse: 61 bpm, Respiratory Rate: 18 breaths/min, Blood Pressure: 145/77 mmHg. Cardiovascular 2+ dorsalis pedis/posterior tibialis pulses. Psychiatric pleasant and  cooperative. General Notes: Right lower extremity: T the anterior aspect there is an open wound with granulation tissue. No surrounding signs of infection. 2+ pitting edema o to the knee. Integumentary (Hair, Skin) Wound #1 status is Open. Original cause of wound was Gradually Appeared. The date acquired was: 08/15/2021. The wound has been in treatment 28 weeks. The wound is located on the Right,Lateral Lower Leg.  The wound measures 1.2cm length x 0.9cm width x 0.1cm depth; 0.848cm^2 area and 0.085cm^3 volume. There is Fat Layer (Subcutaneous Tissue) exposed. There is a medium amount of serosanguineous drainage noted. The wound margin is distinct with the outline attached to the wound base. There is large (67-100%) red, pink granulation within the wound bed. There is no necrotic tissue within the wound bed. The periwound skin appearance exhibited: Scarring. The periwound skin appearance did not exhibit: Callus, Crepitus, Excoriation, Induration, Rash, Dry/Scaly, Maceration, Atrophie Blanche, Cyanosis, Ecchymosis, Hemosiderin Staining, Mottled, Pallor, Rubor, Erythema. Periwound temperature was noted as No Abnormality. The periwound has tenderness on palpation. 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 Type 2 diabetes mellitus with other skin ulcer Chronic combined systolic (congestive) and diastolic (congestive) heart failure Atherosclerotic heart disease of native coronary artery with unspecified angina pectoris Chronic venous hypertension (idiopathic) with ulcer of right lower extremity Patient's wound appears well-healing. I recommended switching the dressing to Hydrofera Blue since there was no real change over the past couple weeks in size. Continue 3 layer compression. Patient has home health that comes out twice weekly. Follow-up in 2 weeks. Procedures Wound #1 Pre-procedure diagnosis of Wound #1 is a  Diabetic Wound/Ulcer of the Lower Extremity located on the Right,Lateral Lower Leg . There was a Three Layer Compression Therapy Procedure by Rhae Hammock, RN. Post procedure Diagnosis Wound #1: Same as Pre-Procedure [name] Follow-up Appointments: Return Appointment in 2 weeks. - w/ Dr. Leo Grosser, Corey [name] ([id number]) [id number] Page 7 of 9 Anesthetic: (In clinic) Topical Lidocaine 5% applied to wound bed (In clinic) Topical Lidocaine 4% applied to wound bed Cellular or Tissue Based Products: Cellular or Tissue Based Product Type: - run insurance for [employer], epicord, and organogenesis advance tissue product. Theraskin not approved. Epicord not covered. [employer] Apligraf approved. Edema Control - Lymphedema / SCD / Other: Avoid standing for long periods of time. Patient to wear own compression stockings every day. - We will apply a tubi grip size E double folded today Home Health: New wound care orders this week; continue Home Health for wound care. May utilize formulary equivalent dressing for wound treatment orders unless otherwise specified. Alvis Lemmings to change 2 x a week. Change dressing to hydraferablue. Other Home Health Orders/Instructions: - [facility] WOUND #1: - Lower Leg Wound Laterality: Right, Lateral Cleanser: Soap and Water (Home Health) 3 x Per Week/30 Days Discharge Instructions: May shower and wash wound with dial antibacterial soap and water prior to dressing change. Cleanser: Wound Cleanser (Home Health) 3 x Per Week/30 Days Discharge Instructions: Cleanse the wound with wound cleanser prior to applying a clean dressing using gauze sponges, not tissue or cotton balls. Topical: Gentamicin 3 x Per Week/30 Days Discharge Instructions: in clinic only Topical: Mupirocin Ointment 3 x Per Week/30 Days Discharge Instructions: in clinic only Prim Dressing: Hydrofera Blue Ready Transfer Foam, 4x5 (in/in) (Home Health) 3 x Per  Week/30 Days ary Discharge Instructions: Apply to wound bed as instructed Secondary Dressing: ABD Pad, 8x10 ([place]) 3 x Per Week/30 Days Discharge Instructions: Apply over primary dressing as directed. Secondary Dressing: Woven Gauze Sponge, Non-Sterile 4x4 in (Home Health) 3 x Per Week/30 Days Discharge Instructions: Apply over primary dressing as directed. Com pression Wrap: ThreePress (3 layer compression wrap) (Home Health) 3 x Per Week/30 Days Discharge Instructions: Apply three layer compression as directed. 1. Hydrofera Blue under 3 layer compressionooright lower extremity 2. Follow-up in  2 weeks Electronic Signature(s) Signed: 05/18/2022 2:54:58 PM By: Kalman Shan DO Entered By: Kalman Shan on 05/18/2022 12:47:32 -------------------------------------------------------------------------------- HxROS Details Patient Name: Date of Service: Corey [name], Corey [name]. 05/18/2022 10:30 A [name] Medical Record Number: [id number] Patient Account Number: [id number] Date of Birth/Sex: Treating RN: 04-14-1946 (77 y.o. [name]) Primary Care Provider: Billey Gosling Other Clinician: Referring Provider: Treating Provider/Extender: [name] in Treatment: 20 Constitutional Symptoms (General Health) Medical History: Past Medical History Notes: CVA 2014 bilateral hearing loss Eyes Medical History: Positive for: Cataracts Respiratory Medical History: Positive for: Sleep Apnea - has CPAP does not wear Cardiovascular Medical History: Positive for: Congestive Heart Failure; Hypertension; Myocardial Infarction - [address] SHYNE, LEHRKE ([id number]) [id number] Page 8 of 9 Medical History: Past Medical History Notes: Diverticulosis Endocrine Medical History: Positive for: Type II Diabetes Time with diabetes: >20 years Treated with: Oral agents, Diet Blood sugar tested every day: Yes Tested : BID Genitourinary Medical  History: Past Medical History Notes: Renal insufficiency 20/ Musculoskeletal Medical History: Positive for: Gout Neurologic Medical History: Negative for: Seizure Disorder Oncologic Medical History: Past Medical History Notes: Prostate Ca HBO Extended History Items Eyes: Cataracts Immunizations Pneumococcal Vaccine: Received Pneumococcal Vaccination: No Implantable Devices No devices added Hospitalization / Surgery History Type of Hospitalization/Surgery 06/2021 inguinal hernia repair cardioversion 09/2020 ventral hernia repair 3/19 Family and Social History Cancer: Yes - Father; Diabetes: Yes - Mother,Father; Heart Disease: Yes - Mother,Siblings; Hypertension: Yes - Mother,Father; Former smoker - quit 1970; Marital Status - Married; Drug Use: No History; Caffeine Use: Moderate - coffee; Financial Concerns: No; Food, Clothing or Shelter Needs: No; Support System Lacking: No; Transportation Concerns: No Electronic Signature(s) Signed: 05/18/2022 2:54:58 PM By: Kalman Shan DO Entered By: Kalman Shan on 05/18/2022 12:44:16 -------------------------------------------------------------------------------- SuperBill Details Patient Name: Date of Service: Corey [name], Corey [name]. 05/18/2022 Medical Record Number: [id number] Patient Account Number: [id number] Date of Birth/Sex: Treating RN: 03-17-1946 (77 y.o. 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 with other specified severity E11.622 Type 2 diabetes mellitus with other skin ulcer I50.42 Chronic combined systolic (congestive) and diastolic (congestive) heart failure I25.119 Atherosclerotic heart  disease of native coronary artery with unspecified angina pectoris I87.311 Chronic venous hypertension (idiopathic) with ulcer of right lower extremity Facility Procedures : CPT4 Code: [id number] Description: (Facility Use Only) [phone] - [facility] LEG Modifier: Quantity: 1 Physician Procedures : CPT4 Code Description Modifier [id number] - WC PHYS LEVEL 3 - EST PT ICD-10 Diagnosis Description L97.918 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

## 2022-05-18 NOTE — Progress Notes (Signed)
Corey, [name] ([id number]) [id number] Page 1 of 10 Visit Report for 03/13/2022 Chief Complaint Document Details Patient Name: Date of Service: Corey [name]. 03/13/2022 11:00 A M Medical Record Number: [id number] Patient Account Number: [id number] Date of Birth/Sex: Treating RN: 1945-08-19 (77 y.o. M) Primary Care Provider: Billey Gosling Other Clinician: Referring Provider: Treating Provider/Extender: Corey [name] in Treatment: 40 Information Obtained from: Patient Chief Complaint 11/01/2021; right lower extremity wound Electronic Signature(s) Signed: 03/13/2022 2:03:41 PM By: Corey [name] Shan DO Entered By: Corey [name] Shan on 03/13/2022 11:55:04 -------------------------------------------------------------------------------- Debridement Details Patient Name: Date of Service: Corey [name], [name] 03/13/2022 11:00 A M Medical Record Number: [id number] Patient Account Number: [id number] Date of Birth/Sex: Treating RN: June 13, 1945 (77 y.o. Corey [name], [name] Primary Care Provider: Billey Gosling Other Clinician: Referring Provider: Treating Provider/Extender: Corey [name] in Treatment: 18 Debridement Performed for Assessment: Wound #1 Right,Lateral Lower Leg Performed By: Physician Corey [name] Shan, DO Debridement Type: Debridement Severity of Tissue Pre Debridement: Fat layer exposed Level of Consciousness (Pre-procedure): Awake and Alert Pre-procedure Verification/Time Out Yes - 11:45 Taken: Start Time: 11:45 Pain Control: Lidocaine T Area Debrided (L x W): otal 5 (cm) x 2.5 (cm) = 12.5 (cm) Tissue and other material debrided: Viable, Non-Viable, Slough, Subcutaneous, Slough Level: Skin/Subcutaneous Tissue Debridement Description: Excisional Instrument: Curette Bleeding: Minimum Hemostasis Achieved: Pressure End Time: 11:45 Procedural Pain: 0 Post Procedural Pain: 0 Response to Treatment: Procedure  was tolerated well Level of Consciousness (Post- Awake and Alert procedure): Post Debridement Measurements of Total Wound Length: (cm) 5 Width: (cm) 2.5 Depth: (cm) 0.1 Volume: (cm) 0.982 Character of Wound/Ulcer Post Debridement: Improved Severity of Tissue Post Debridement: Fat layer exposed Corey, [name] ([id number]) [id number] Page 2 of 10 Post Procedure Diagnosis Same as Pre-procedure Electronic Signature(s) Signed: 03/13/2022 2:03:41 PM By: Corey [name] Shan DO Signed: 05/17/2022 5:36:43 PM By: Corey Hammock RN Entered By: Corey [name] on 03/13/2022 11:44:00 -------------------------------------------------------------------------------- HPI Details Patient Name: Date of Service: Corey [name], [name] 03/13/2022 11:00 A M Medical Record Number: [id number] Patient Account Number: [id number] Date of Birth/Sex: Treating RN: 07-09-1945 (77 y.o. M) Primary Care Provider: Billey Gosling Other Clinician: Referring Provider: Treating Provider/Extender: Corey [name] in Treatment: 18 History of Present Illness HPI Description: Admission 11/01/2021 Corey [name] Is a 77 year old male with a past medical history of of type 2 diabetes, coronary artery disease, stage IV kidney disease acute on chronic combined systolic and diastolic heart failure that presents to the clinic for a 54-monthhistory of right lower extremity wound. He states he is not sure how this started. It is completely eschared. He reports his wound has been stable for the past month. He has been keeping the area covered. He has mild chronic pain. He denies signs of infection. He states he has a history of lower extremity ulcers last being on the left leg. He could not tell me how this was treated in the past. 6/27; patient presents for follow-up. He is scheduled for his lower extremity arterial studies on July 6th. He has been using Medihoney to the wound bed. He has no  issues or complaints today. He denies signs of infection. 7/6; patient presents for follow-up. He has home health that reported the wound having odor and increased pain last week. He was sent to urgent care. He was started on doxycycline. He is continuing Medihoney. He has his scheduled ABIs w/TBIs today. 7/10; patient presents for follow-up. He had  his arterial studies done last week. He had triphasic waveforms throughout the right lower extremity. His TBI was 0.64. He has good pedal pulses. He is almost done with his course of antibiotics. He has been using mupirocin ointment to the wound bed. 7/17; patient presents for follow-up. He has been using Iodosorb under compression therapy. He has no issues or complaints today. He states he is receiving Keystone antibiotic tomorrow In the mail. 7/24; patient presents for follow-up. He has been doing Dakin's wet-to-dry dressings to the wound bed daily without issues. He currently denies signs of infection. He reports completing his course of doxycycline. He has his Keystone antibiotic with him today. 7/31; patient presents for follow-up. We have been using Iodoflex with [facility] under compression therapy. He has no issues or complaints today. He denies signs of infection. 8/7; patient presents for follow-up. We have been using Iodoflex with [facility] under compression therapy. Home health came out once last week to change the wrap. He has no issues or complaints today. He denies signs of infection. 8/14; patient presents for follow-up. We have been using Keystone with Hydrofera Blue under compression therapy. He has tolerated this well. He has no issues or complaints today. 8/21; patient presents for follow-up. We have been using Keystone with Hydrofera Blue under compression therapy. The Hydrofera Blue was stuck on tightly to the wound bed today. Other than that he has no issues or complaints. We have not heard back for insurance approval for skin  sub. 8/28; patient presents for follow-up. We ordered blast X and collagen powder at last clinic visit. It is unclear if he has received this. We placed Keystone with Fibracol under 3 layer compression last week. Patient has home health. Patient has no issues or complaints today. He was not approved for TheraSkin. 9/5; patient presents for follow-up. Insurance would not cover blast X. We do have a sample and he would like to try this. We have been using Keystone antibiotic and collagen under 3 layer compression. He has no issues or complaints today. 9/18; patient presents for follow-up. We have been using collagen with blast X and Keystone under compression therapy. He has no issues or complaints today. 9/25; patient presents for follow-up. We have continued to use collagen, blast X and Keystone under compression therapy. Patient has no issues or complaints today. 10/6; patient presents for follow-up. We have been using collagen, blast X and Keystone under compression therapy. He denies signs of infection and has no issues or complaints today. Patient has home health that comes out on Wednesdays and Fridays. 10/16; patient presents for follow-up. He has been using collagen, blast X and Keystone with benefit to the wound bed under compression therapy. He is about to run out of [facility] antibiotic ointment. He has no issues or complaints today. 10/23; patient presents for follow-up. We continue to use collagen, blast X and Keystone under compression therapy. Continued report in wound healing. Home health continues to come out to do dressing changes. Patient has no issues or complaints today. Corey [name], Corey [name] ([id number]) [id number] Page 3 of 10 10/30; patient presents for follow-up. We have been using collagen, blast X and Keystone antibiotic under compression therapy. His wound continues to show improvement in healing. He has no issues or complaints today. Electronic  Signature(s) Signed: 03/13/2022 2:03:41 PM By: Corey [name] Shan DO Entered By: Corey [name] Shan on 03/13/2022 12:20:19 -------------------------------------------------------------------------------- Physical Exam Details Patient Name: Date of Service: Corey [name], [name] 03/13/2022 11:00 A M Medical Record  Number: [id number] Patient Account Number: [id number] Date of Birth/Sex: Treating RN: 1946/03/13 (77 y.o. M) Primary Care Provider: Billey Gosling Other Clinician: Referring Provider: Treating Provider/Extender: Corey [name] in Treatment: 15 Constitutional respirations regular, non-labored and within target range for patient.. Cardiovascular 2+ dorsalis pedis/posterior tibialis pulses. Psychiatric pleasant and cooperative. Notes Right lower extremity: T the anterior aspect there is granulation tissue and nonviable tissue present. No signs of surrounding infection. Good edema control. o Electronic Signature(s) Signed: 03/13/2022 2:03:41 PM By: Corey [name] Shan DO Entered By: Corey [name] Shan on 03/13/2022 12:21:08 -------------------------------------------------------------------------------- Physician Orders Details Patient Name: Date of Service: Corey [name], [name] 03/13/2022 11:00 A M Medical Record Number: [id number] Patient Account Number: [id number] Date of Birth/Sex: Treating RN: Nov 25, 1945 (77 y.o. [name] Primary Care Provider: Billey Gosling Other Clinician: Referring Provider: Treating Provider/Extender: Corey [name] in Treatment: 10 Verbal / Phone Orders: No Diagnosis Coding Follow-up Appointments ppointment in 2 weeks. - w/ Dr. Heber [name] Return A Other: - ****[facility] topical compounding antibiotics bring to appt each time.**** Bring in the Blastx as well. Anesthetic (In clinic) Topical Lidocaine 5% applied to wound bed (In clinic) Topical Lidocaine 4% applied to wound bed Cellular or Tissue Based  Products Cellular or Tissue Based Product Type: - run insurance for [employer], epicord, and organogenesis advance tissue product. Theraskin not approved. Epicord not covered. Organ[employer] Apligraf approved. Corey [name], Corey [name] ([id number]) [id number] Page 4 of 10 Bathing/ Shower/ Hygiene May shower with protection but do not get wound dressing(s) wet. Edema Control - Lymphedema / SCD / Other Elevate legs to the level of the heart or above for 30 minutes daily and/or when sitting, a frequency of: - 3-4 times a day throughout the day. Avoid standing for long periods of time. Home Health New wound care orders this week; continue Home Health for wound care. May utilize formulary equivalent dressing for wound treatment orders unless otherwise specified. Corey [name] to change 2 x a week. Only doing keystone and collagen under 3 layer. Once [place] runs out just do collagen. Other Home Health Orders/Instructions: - [facility] Wound Treatment Wound #1 - Lower Leg Wound Laterality: Right, Lateral Cleanser: Soap and Water (Home Health) 3 x Per Week/30 Days Discharge Instructions: May shower and wash wound with dial antibacterial soap and water prior to dressing change. Cleanser: Wound Cleanser (Home Health) 3 x Per Week/30 Days Discharge Instructions: Cleanse the wound with wound cleanser prior to applying a clean dressing using gauze sponges, not tissue or cotton balls. Topical: [facility]-topical compounding antibiotics (Home Health) 3 x Per Week/30 Days Discharge Instructions: apply directly to wound bed. Prim Dressing: Fibracol Plus Dressing, 4x4.38 in (collagen) (Home Health) 3 x Per Week/30 Days ary Discharge Instructions: Moisten collagen with saline or hydrogel until the blastx and powder collagen arrive. Secondary Dressing: ABD Pad, 8x10 (Home Health) 3 x Per Week/30 Days Discharge Instructions: Apply over primary dressing as directed. Secondary  Dressing: Woven Gauze Sponge, Non-Sterile 4x4 in (Home Health) 3 x Per Week/30 Days Discharge Instructions: Apply over primary dressing as directed. Compression Wrap: ThreePress (3 layer compression wrap) 3 x Per Week/30 Days Discharge Instructions: Apply three layer compression as directed. Electronic Signature(s) Signed: 03/13/2022 2:03:41 PM By: Corey [name] Shan DO Entered By: Corey [name] Shan on 03/13/2022 12:21:16 -------------------------------------------------------------------------------- Problem List Details Patient Name: Date of Service: Corey [name], [name] 03/13/2022 11:00 A M Medical Record Number: [id number] Patient Account Number: [id number] Date of Birth/Sex: Treating RN: [birth year] [phone]  y.o. M) Primary Care Provider: Billey Gosling Other Clinician: Referring Provider: Treating Provider/Extender: Corey [name] in Treatment: 66 Active Problems ICD-10 Encounter Code Description Active Date MDM Diagnosis L97.918 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 with other 11/01/2021 No Yes specified severity E11.622 Type 2 diabetes mellitus with other skin ulcer 11/01/2021 No Yes I50.42 Chronic combined systolic (congestive) and diastolic (congestive) heart failure 11/01/2021 No Yes Corey [name], Corey [name] ([id number]) [id number] Page 5 of 10 I25.119 Atherosclerotic heart disease of native coronary artery with unspecified angina 11/01/2021 No Yes pectoris I87.311 Chronic venous hypertension (idiopathic) with ulcer of right lower extremity 12/26/2021 No Yes Inactive Problems Resolved Problems Electronic Signature(s) Signed: 03/13/2022 2:03:41 PM By: Corey [name] Shan DO Entered By: Corey [name] Shan on 03/13/2022 11:53:27 -------------------------------------------------------------------------------- Progress Note Details Patient Name: Date of Service: Corey [name], [name] 03/13/2022 11:00 A M Medical Record Number:  [id number] Patient Account Number: [id number] Date of Birth/Sex: Treating RN: [birth year] (77 y.o. M) Primary Care Provider: Billey Gosling Other Clinician: Referring Provider: Treating Provider/Extender: Corey [name] in Treatment: 52 Subjective Chief Complaint Information obtained from Patient 11/01/2021; right lower extremity wound History of Present Illness (HPI) Admission 11/01/2021 Mr. Johnte Portnoy Is a 77 year old male with a past medical history of of type 2 diabetes, coronary artery disease, stage IV kidney disease acute on chronic combined systolic and diastolic heart failure that presents to the clinic for a 73-monthhistory of right lower extremity wound. He states he is not sure how this started. It is completely eschared. He reports his wound has been stable for the past month. He has been keeping the area covered. He has mild chronic pain. He denies signs of infection. He states he has a history of lower extremity ulcers last being on the left leg. He could not tell me how this was treated in the past. 6/27; patient presents for follow-up. He is scheduled for his lower extremity arterial studies on July 6th. He has been using Medihoney to the wound bed. He has no issues or complaints today. He denies signs of infection. 7/6; patient presents for follow-up. He has home health that reported the wound having odor and increased pain last week. He was sent to urgent care. He was started on doxycycline. He is continuing Medihoney. He has his scheduled ABIs w/TBIs today. 7/10; patient presents for follow-up. He had his arterial studies done last week. He had triphasic waveforms throughout the right lower extremity. His TBI was 0.64. He has good pedal pulses. He is almost done with his course of antibiotics. He has been using mupirocin ointment to the wound bed. 7/17; patient presents for follow-up. He has been using Iodosorb under compression therapy. He has no issues or  complaints today. He states he is receiving Keystone antibiotic tomorrow In the mail. 7/24; patient presents for follow-up. He has been doing Dakin's wet-to-dry dressings to the wound bed daily without issues. He currently denies signs of infection. He reports completing his course of doxycycline. He has his Keystone antibiotic with him today. 7/31; patient presents for follow-up. [facility] He has no issues or complaints today. He denies signs of infection. 8/7; patient presents for follow-up. [facility] Home health came out once last week to change the wrap. He has no issues or complaints today. He denies signs of infection. 8/14; patient presents for follow-up. We have been using  Keystone with Hydrofera Blue under compression therapy. He has tolerated this well. He has no issues or complaints today. 8/21; patient presents for follow-up. We have been using Keystone with Hydrofera Blue under compression therapy. The Hydrofera Blue was stuck on tightly to the wound bed today. Other than that he has no issues or complaints. We have not heard back for insurance approval for skin sub. 8/28; patient presents for follow-up. We ordered blast X and collagen powder at last clinic visit. It is unclear if he has received this. We placed Keystone with Fibracol under 3 layer compression last week. Patient has home health. Patient has no issues or complaints today. He was not approved for TheraSkin. 9/5; patient presents for follow-up. Insurance would not cover blast X. We do have a sample and he would like to try this. We have been using Corey [name], Corey [name] ([id number]) [id number] Page 6 of 10 antibiotic and collagen under 3 layer compression. He has no issues or complaints today. 9/18; patient presents for follow-up. We have been using collagen with blast X and  Keystone under compression therapy. He has no issues or complaints today. 9/25; patient presents for follow-up. We have continued to use collagen, blast X and Keystone under compression therapy. Patient has no issues or complaints today. 10/6; patient presents for follow-up. We have been using collagen, blast X and Keystone under compression therapy. He denies signs of infection and has no issues or complaints today. Patient has home health that comes out on Wednesdays and Fridays. 10/16; patient presents for follow-up. He has been using collagen, blast X and [facility]stone with benefit to the wound bed under compression therapy. He is about to run out of [facility] antibiotic ointment. He has no issues or complaints today. 10/23; patient presents for follow-up. We continue to use collagen, blast X and Keystone under compression therapy. Continued report in wound healing. Home health continues to come out to do dressing changes. Patient has no issues or complaints today. 10/30; patient presents for follow-up. We have been using collagen, blast X and [facility]stone antibiotic under compression therapy. His wound continues to show improvement in healing. He has no issues or complaints today. Patient History Family History Cancer - Father, Diabetes - Mother,Father, Heart Disease - Mother,Siblings, Hypertension - Mother,Father. Social History Former smoker - quit 1970, Marital Status - Married, Drug Use - No History, Caffeine Use - Moderate - coffee. Medical History Eyes Patient has history of Cataracts Respiratory Patient has history of Sleep Apnea - has CPAP does not wear Cardiovascular Patient has history of Congestive Heart Failure, Hypertension, Myocardial Infarction - 2014 Endocrine Patient has history of Type II Diabetes Musculoskeletal Patient has history of Gout Neurologic Denies history of Seizure Disorder Hospitalization/Surgery History - 06/2021 inguinal hernia repair. - cardioversion 09/2020.  - ventral hernia repair 3/19. 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 Vitals Time Taken: 11:15 AM, Height: 66 in, Weight: 145 lbs, BMI: 23.4, Temperature: 97.8 F, Pulse: 57 bpm, Respiratory Rate: 16 breaths/min, Blood Pressure: 136/84 mmHg. Cardiovascular 2+ dorsalis pedis/posterior tibialis pulses. Psychiatric pleasant and cooperative. General Notes: Right lower extremity: T the anterior aspect there is granulation tissue and nonviable tissue present. No signs of surrounding infection. Good o edema control. Integumentary (Hair, Skin) Wound #1 status is Open. Original cause of wound was Gradually Appeared. The date acquired was: 08/15/2021.  The wound has been in treatment 18 weeks. The wound is located on the Right,Lateral Lower Leg. The wound measures 5cm length x 2.5cm width x 0.1cm depth; 9.817cm^2 area and 0.982cm^3 volume. There is Fat Layer (Subcutaneous Tissue) exposed. There is no tunneling or undermining noted. There is a medium amount of serosanguineous drainage noted. The wound margin is distinct with the outline attached to the wound base. There is large (67-100%) red, pink granulation within the wound bed. There is a small (1- 33%) amount of necrotic tissue within the wound bed including Adherent Slough. The periwound skin appearance exhibited: Scarring. The periwound skin appearance did not exhibit: Callus, Crepitus, Excoriation, Induration, Rash, Dry/Scaly, Maceration, Atrophie Blanche, Cyanosis, Ecchymosis, Hemosiderin Staining, Mottled, Pallor, Rubor, Erythema. Periwound temperature was noted as No Abnormality. The periwound has tenderness on palpation. Corey [name], Corey [name] ([id number])  [id number] Page 7 of 10 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 Type 2 diabetes mellitus with other skin ulcer Chronic combined systolic (congestive) and diastolic (congestive) heart failure Atherosclerotic heart disease of native coronary artery with unspecified angina pectoris Chronic venous hypertension (idiopathic) with ulcer of right lower extremity Patient's wound has shown improvement in size and appearance since last clinic visit. I debrided nonviable tissue. There was more maceration to the periwound. I recommended continuing with collagen and Keystone and stopping blast X. Once Corey [name] is finished then he can just use collagen under compression therapy. Procedures Wound #1 Pre-procedure diagnosis of Wound #1 is a Diabetic Wound/Ulcer of the Lower Extremity located on the Right,Lateral Lower Leg .Severity of Tissue Pre Debridement is: Fat layer exposed. There was a Excisional Skin/Subcutaneous Tissue Debridement with a total area of 12.5 sq cm performed by Corey [name] Shan, DO. With the following instrument(s): Curette to remove Viable and Non-Viable tissue/material. Material removed includes Subcutaneous Tissue and Slough and after achieving pain control using Lidocaine. No specimens were taken. A time out was conducted at 11:45, prior to the start of the procedure. A Minimum amount of bleeding was controlled with Pressure. The procedure was tolerated well with a pain level of 0 throughout and a pain level of 0 following the procedure. Post Debridement Measurements: 5cm length x 2.5cm width x 0.1cm depth; 0.982cm^3 volume. Character of Wound/Ulcer Post Debridement is improved. Severity of Tissue Post Debridement is: Fat layer exposed. Post procedure Diagnosis Wound #1: Same as Pre-Procedure Pre-procedure diagnosis of Wound #1 is a Diabetic Wound/Ulcer of the Lower  Extremity located on the Right,Lateral Lower Leg . There was a Three Layer Compression Therapy Procedure by Corey Hammock, RN. Post procedure Diagnosis Wound #1: Same as Pre-Procedure Plan Follow-up Appointments: Return Appointment in 2 weeks. - w/ Dr. Heber Corey [name] Other: - ****[facility] topical compounding antibiotics bring to appt each time.**** Bring in the Blastx as well. Anesthetic: (In clinic) Topical Lidocaine 5% applied to wound bed (In clinic) Topical Lidocaine 4% applied to wound bed Cellular or Tissue Based Products: Cellular or Tissue Based Product Type: - run insurance for [employer], epicord, and organogenesis advance tissue product. [employer] not approved. Epicord not covered. [employer] Apligraf approved. Bathing/ Shower/ Hygiene: May shower with protection but do not get wound dressing(s) wet. Edema Control - Lymphedema / SCD / Other: Elevate legs to the level of the heart or above for 30 minutes daily and/or when sitting, a frequency of: - 3-4 times a day throughout the day. Avoid standing for long periods of time. Home Health: New wound care orders this  week; continue Home Health for wound care. May utilize formulary equivalent dressing for wound treatment orders unless otherwise specified. Corey [name] to change 2 x a week. Only doing keystone and collagen under 3 layer. Once Corey [name] runs out just do collagen. Other Home Health Orders/Instructions: - [facility] WOUND #1: - Lower Leg Wound Laterality: Right, Lateral Cleanser: Soap and Water (Home Health) 3 x Per Week/30 Days Discharge Instructions: May shower and wash wound with dial antibacterial soap and water prior to dressing change. Cleanser: Wound Cleanser (Home Health) 3 x Per Week/30 Days Discharge Instructions: Cleanse the wound with wound cleanser prior to applying a clean dressing using gauze sponges, not tissue or cotton balls. Topical: [facility]-topical compounding antibiotics (Home  Health) 3 x Per Week/30 Days Discharge Instructions: apply directly to wound bed. Prim Dressing: Fibracol Plus Dressing, 4x4.38 in (collagen) (Home Health) 3 x Per Week/30 Days ary Discharge Instructions: Moisten collagen with saline or hydrogel until the blastx and powder collagen arrive. Secondary Dressing: ABD Pad, 8x10 (Home Health) 3 x Per Week/30 Days Discharge Instructions: Apply over primary dressing as directed. Secondary Dressing: Woven Gauze Sponge, Non-Sterile 4x4 in (Home Health) 3 x Per Week/30 Days Discharge Instructions: Apply over primary dressing as directed. Com pression Wrap: ThreePress (3 layer compression wrap) 3 x Per Week/30 Days Discharge Instructions: Apply three layer compression as directed. 1. In office sharp debridement 2. Collagen and Keystone antibiotic under 3 layer compression 3. Follow-up in 1 week Corey [name], Corey [name] ([id number]) [id number] Page 8 of 10 Electronic Signature(s) Signed: 03/13/2022 2:03:41 PM By: Corey [name] Shan DO Entered By: Corey [name] Shan on 03/13/2022 12:25:01 -------------------------------------------------------------------------------- HxROS Details Patient Name: Date of Service: Corey [name], [name] 03/13/2022 11:00 A M Medical Record Number: [id number] Patient Account Number: [id number] Date of Birth/Sex: Treating RN: 09/26/1945 (77 y.o. M) Primary Care Provider: Billey Gosling Other Clinician: Referring Provider: Treating Provider/Extender: [name] in Treatment: 38 Constitutional Symptoms (General Health) Medical History: Past Medical History Notes: CVA 2014 bilateral hearing loss Eyes Medical History: Positive for: Cataracts Respiratory Medical History: Positive for: Sleep Apnea - has CPAP does not wear Cardiovascular Medical History: Positive for: Congestive Heart Failure; Hypertension; Myocardial Infarction - 2014 Gastrointestinal Medical History: Past Medical  History Notes: Diverticulosis Endocrine Medical History: Positive for: Type II Diabetes Time with diabetes: >20 years Treated with: Oral agents, Diet Blood sugar tested every day: Yes Tested : BID Genitourinary Medical History: Past Medical History Notes: Renal insufficiency 20/ Musculoskeletal Medical History: Positive for: Gout Neurologic Medical History: Negative for: Seizure Disorder Oncologic Corey [name], Corey [name] ([id number]) [id number] Page 9 of 10 Medical History: Past Medical History Notes: Prostate Ca HBO Extended History Items Eyes: Cataracts Immunizations Pneumococcal Vaccine: Received Pneumococcal Vaccination: No Implantable Devices No devices added Hospitalization / Surgery History Type of Hospitalization/Surgery 06/2021 inguinal hernia repair cardioversion 09/2020 ventral hernia repair 3/19 Family and Social History Cancer: Yes - Father; Diabetes: Yes - Mother,Father; Heart Disease: Yes - Mother,Siblings; Hypertension: Yes - Mother,Father; Former smoker - quit 1970; Marital Status - Married; Drug Use: No History; Caffeine Use: Moderate - coffee; Financial Concerns: No; Food, Clothing or Shelter Needs: No; Support System Lacking: No; Transportation Concerns: No Electronic Signature(s) Signed: 03/13/2022 2:03:41 PM By: Corey [name] Shan DO Entered By: Corey [name] Shan on 03/13/2022 12:20:26 -------------------------------------------------------------------------------- [place] Details Patient Name: Date of Service: Corey [name], [name] 03/13/2022 Medical Record Number: [id number] Patient Account Number: [id number] Date of Birth/Sex: Treating RN: May 08, 1946 (77 y.o. 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 with other specified severity E11.622 Type 2 diabetes mellitus with other skin ulcer I50.42 Chronic combined systolic (congestive) and diastolic (congestive) heart failure I25.119 Atherosclerotic heart disease of native coronary artery with unspecified angina pectoris I87.311 Chronic venous hypertension (idiopathic) with ulcer of right lower extremity Facility Procedures : CPT4 Code: 98338250 Description: 53976 - DEB SUBQ TISSUE 20 SQ CM/< ICD-10 Diagnosis Description L97.918 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 with other spe Modifier: cified severity Quantity: 1 Physician Procedures : CPT4 Code Description Modifier [id number] 90240 - WC PHYS SUBQ TISS 20 SQ CM ICD-10 Diagnosis Description L97.918 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 with other specified severity RISHAB, STOUDT ([id number])  [id number] Page Quantity: 1 10 of 10 Electronic Signature(s) Signed: 03/13/2022 2:03:41 PM By: Corey [name] Shan DO Entered By: Corey [name] Shan on 03/13/2022 12:25:31

## 2022-05-19 NOTE — Progress Notes (Signed)
Corey, [name] ([id number]) [id number] Page 1 of 8 Visit Report for 05/18/2022 Arrival Information Details Patient Name: Date of Service: Corey [name]. 05/18/2022 10:30 A M Medical Record Number: [id number] Patient Account Number: [id number] Date of Birth/Sex: Treating RN: 1945/06/06 (77 y.o. M) Primary Care Llewyn Heap: Corey [name] Other Clinician: Referring Orenthal Debski: Treating Milly Goggins/Extender: Mickle Asper in Treatment: 28 Visit Information History Since Last Visit Added or deleted any medications: No Patient Arrived: Ambulatory Any new allergies or adverse reactions: No Arrival Time: 10:59 Had a fall or experienced change in No Accompanied By: wife activities of daily living that may affect Transfer Assistance: None risk of falls: Patient Identification Verified: Yes Signs or symptoms of abuse/neglect since last visito No Secondary Verification Process Completed: Yes Hospitalized since last visit: No Patient Requires Transmission-Based No Implantable device outside of the clinic excluding No Precautions: cellular tissue based products placed in the center Patient Has Alerts: Yes since last visit: Patient Alerts: in clinic Corey [name] Has Compression in Place as Prescribed: Yes 11/17/2021 ABIs: both [place] Pain Present Now: No 11/17/2021 TBI: R0.64 L0.58 Electronic Signature(s) Signed: 05/18/2022 4:16:52 PM By: Erenest [name] Entered By: Erenest [name] on 05/18/2022 11:00:52 -------------------------------------------------------------------------------- Complex / Palliative Patient Assessment Details Patient Name: Date of Service: Corey [name], [name] 05/18/2022 10:30 A M Medical Record Number: [id number] Patient Account Number: [id number] Date of Birth/Sex: Treating RN: January 15, 1946 (77 y.o. Corey [name] Primary Care Darean Rote: Corey [name] Other Clinician: Referring Matai Carpenito: Treating Berley Gambrell/Extender: Mickle Asper  in Treatment: 28 Complex Wound Management Criteria Patient has remarkable or complex co-morbidities requiring medications or treatments that extend wound healing times. Examples: Diabetes mellitus with chronic renal failure or end stage renal disease requiring dialysis Advanced or poorly controlled rheumatoid arthritis Diabetes mellitus and end stage chronic obstructive pulmonary disease Active cancer with current chemo- or radiation therapy CVA, HTN, CHF, MI, DM type II, seizure, gout Palliative Wound Management Criteria Care Approach Wound Care Plan: Complex Wound Management Electronic Signature(s) Signed: 05/19/2022 4:47:16 PM By: Deon Pilling RN, BSN Signed: 05/22/2022 12:33:43 PM By: Kalman Shan DO Entered By: Deon Pilling on 05/19/2022 16:47:16 Corey [name] ([id number]) [id number] Page 2 of 8 -------------------------------------------------------------------------------- Compression Therapy Details Patient Name: Date of Service: Corey [name]. 05/18/2022 10:30 A M Medical Record Number: [id number] Patient Account Number: [id number] Date of Birth/Sex: Treating RN: [birth year] (77 y.o. [name] Primary Care Bazil Dhanani: Corey [name] Other Clinician: Referring Trevor Wilkie: Treating Adia Crammer/Extender: [name] in Treatment: 28 Compression Therapy Performed for Wound Assessment: Wound #1 Right,Lateral Lower Leg Performed By: Clinician Rhae Hammock, RN Compression Type: Three Layer Post Procedure Diagnosis Same as Pre-procedure Electronic Signature(s) Signed: 05/19/2022 12:14:03 PM By: Rhae Hammock RN Entered By: Rhae Hammock on 05/18/2022 12:03:52 -------------------------------------------------------------------------------- Encounter Discharge Information Details Patient Name: Date of Service: Corey [name], [name] 05/18/2022 10:30 A M Medical Record Number: [id number] Patient Account Number:  [id number] Date of Birth/Sex: Treating RN: 1946/02/18 (77 y.o. [name] Primary Care Leveta Wahab: Corey [name] Other Clinician: Referring Guadalupe Kerekes: Treating Corey [name]/Extender: Mickle Asper in Treatment: 72 Encounter Discharge Information Items Discharge Condition: Stable Ambulatory Status: Ambulatory Discharge Destination: Home Transportation: Private Auto Accompanied By: Kateri Mc Schedule Follow-up Appointment: Yes Clinical Summary of Care: Patient Declined Electronic Signature(s) Signed: 05/19/2022 12:14:03 PM By: Rhae Hammock RN Entered By: Rhae Hammock on 05/18/2022 12:41:07 -------------------------------------------------------------------------------- Lower Extremity Assessment Details Patient Name: Date of Service: Corey O NCE,  [name] 05/18/2022 10:30 A M Medical Record Number: [id number] Patient Account Number: [id number] Date of Birth/Sex: Treating RN: [birth year] (77 y.o. M) Primary Care Corey [name]: Corey [name] Other Clinician: Referring [name]: Treating [name]/Extender: [name] in Treatment: 28 Edema Assessment Left: [Left: Right] [Right: :] Assessed: [Left: No] [Right: No] Edema: [Left: Ye] [Right: s] Calf Left: Right: Point of Measurement: 33 cm From Medial Instep 40.5 cm Ankle Left: Right: Point of Measurement: 11 cm From Medial Instep 22 cm Electronic Signature(s) Signed: 05/18/2022 4:16:52 PM By: Erenest [name] Entered By: Erenest [name] on 05/18/2022 11:10:41 -------------------------------------------------------------------------------- Multi Wound Chart Details Patient Name: Date of Service: Corey [name], [name] 05/18/2022 10:30 A M Medical Record Number: [id number] Patient Account Number: [id number] Date of Birth/Sex: Treating RN: [birth year] (77 y.o. M) Primary Care Corey [name]: Corey [name] Other Clinician: Referring Darneisha Windhorst: Treating Clorene Nerio/Extender: Mickle Asper in  Treatment: 28 Vital Signs Height(in): 66 Pulse(bpm): 61 Weight(lbs): 145 Blood Pressure(mmHg): 145/77 Body Mass Index(BMI): 23.4 Temperature(F): 98.3 Respiratory Rate(breaths/min): 18 [1:Photos:] [N/A:N/A] Right, Lateral Lower Leg N/A N/A Wound Location: Gradually Appeared N/A N/A Wounding Event: Diabetic Wound/Ulcer of the Lower N/A N/A Primary Etiology: Extremity Cataracts, Sleep Apnea, Congestive N/A N/A Comorbid History: Heart Failure, Hypertension, Myocardial Infarction, Type II Diabetes, Gout 08/15/2021 N/A N/A Date Acquired: 28 N/A N/A Weeks of Treatment: Open N/A N/A Wound Status: No N/A N/A Wound Recurrence: 1.2x0.9x0.1 N/A N/A Measurements L x W x D (cm) 0.848 N/A N/A A (cm) : rea 0.085 N/A N/A Volume (cm) : 96.10% N/A N/A % Reduction in A rea: 96.10% N/A N/A % Reduction in Volume: Grade 2 N/A N/A Classification: Medium N/A N/A Exudate A mount: Serosanguineous N/A N/A Exudate Type: red, brown N/A N/A Exudate Color: Distinct, outline attached N/A N/A Wound Margin: Large (67-100%) N/A N/A Granulation A mount: Red, Pink N/A N/A Granulation Quality: None Present (0%) N/A N/A Necrotic A mount: ARJEN, DERINGER ([id number]) [id number] Page 4 of 8 Fat Layer (Subcutaneous Tissue): Yes N/A N/A Exposed Structures: Fascia: No Tendon: No Muscle: No Joint: No Bone: No Large (67-100%) N/A N/A Epithelialization: Scarring: Yes N/A N/A Periwound Skin Texture: Excoriation: No Induration: No Callus: No Crepitus: No Rash: No Maceration: No N/A N/A Periwound Skin Moisture: Dry/Scaly: No Atrophie Blanche: No N/A N/A Periwound Skin Color: Cyanosis: No Ecchymosis: No Erythema: No Hemosiderin Staining: No Mottled: No Pallor: No Rubor: No No Abnormality N/A N/A Temperature: Yes N/A N/A Tenderness on Palpation: Compression Therapy N/A N/A Procedures Performed: Treatment Notes Wound #1 (Lower Leg) Wound Laterality:  Right, Lateral Cleanser Soap and Water Discharge Instruction: May shower and wash wound with dial antibacterial soap and water prior to dressing change. Wound Cleanser Discharge Instruction: Cleanse the wound with wound cleanser prior to applying a clean dressing using gauze sponges, not tissue or cotton balls. Peri-Wound Care Topical Gentamicin Discharge Instruction: in clinic only Mupirocin Ointment Discharge Instruction: in clinic only Primary Dressing Hydrofera Blue Ready Transfer Foam, 4x5 (in/in) Discharge Instruction: Apply to wound bed as instructed Secondary Dressing ABD Pad, 8x10 Discharge Instruction: Apply over primary dressing as directed. Woven Gauze Sponge, Non-Sterile 4x4 in Discharge Instruction: Apply over primary dressing as directed. Secured With Compression Wrap ThreePress (3 layer compression wrap) Discharge Instruction: Apply three layer compression as directed. Compression Stockings Add-Ons Electronic Signature(s) Signed: 05/18/2022 2:54:58 PM By: Kalman Shan DO Entered By: Kalman Shan on 05/18/2022 12:41:41 Corey [name] ([id number]) [id number] Page 5 of 8 -------------------------------------------------------------------------------- Multi-Disciplinary Care Plan Details Patient Name: Date  of Service: Corey [name]. 05/18/2022 10:30 A M Medical Record Number: [id number] Patient Account Number: [id number] Date of Birth/Sex: Treating RN: [birth year] (77 y.o. [name], [name] Primary Care Denean Pavon: Corey [name] Other Clinician: Referring Abrey Bradway: Treating Wendy Hoback/Extender: Mickle Asper in Treatment: 28 Active Inactive Necrotic Tissue Nursing Diagnoses: Knowledge deficit related to management of necrotic/devitalized tissue Goals: Necrotic/devitalized tissue will be minimized in the wound bed Date Initiated: 11/01/2021 Target Resolution Date: 05/13/2022 Goal Status:  Active Patient/caregiver will verbalize understanding of reason and process for debridement of necrotic tissue Date Initiated: 11/01/2021 Date Inactivated: 12/05/2021 Target Resolution Date: 12/09/2021 Goal Status: Met Interventions: Assess patient pain level pre-, during and post procedure and prior to discharge Provide education on necrotic tissue and debridement process Treatment Activities: Apply topical anesthetic as ordered : 11/01/2021 Enzymatic debridement : 11/01/2021 Excisional debridement : 11/01/2021 Notes: Pain, Acute or Chronic Nursing Diagnoses: Pain, acute or chronic: actual or potential Potential alteration in comfort, pain Goals: Patient will verbalize adequate pain control and receive pain control interventions during procedures as needed Date Initiated: 11/01/2021 Date Inactivated: 11/21/2021 Target Resolution Date: 12/09/2021 Goal Status: Met Patient/caregiver will verbalize comfort level met Date Initiated: 11/01/2021 Target Resolution Date: 05/13/2022 Goal Status: Active Interventions: Complete pain assessment as per visit requirements Provide education on pain management Treatment Activities: Administer pain control measures as ordered : 11/01/2021 Notes: Electronic Signature(s) Signed: 05/19/2022 12:14:03 PM By: Rhae Hammock RN Entered By: Rhae Hammock on 05/18/2022 12:04:01 -------------------------------------------------------------------------------- Pain Assessment Details Patient Name: Date of Service: Corey [name], [name] 05/18/2022 10:30 A M Medical Record Number: [id number] Patient Account Number: [id number] JAI, STEIL ([id number]) [id number] Page 6 of 8 Date of Birth/Sex: Treating RN: 26-Jan-1946 (77 y.o. M) Primary Care Verina Galeno: Other Clinician: Billey [name] Referring Latrica Clowers: Treating Alpha Mysliwiec/Extender: Mickle Asper in Treatment: 28 Active Problems Location of Pain Severity and  Description of Pain Patient Has Paino No Site Locations Pain Management and Medication Current Pain Management: Electronic Signature(s) Signed: 05/18/2022 4:16:52 PM By: Erenest [name] Entered By: Erenest [name] on 05/18/2022 11:01:15 -------------------------------------------------------------------------------- Patient/Caregiver Education Details Patient Name: Date of Service: Corey [name] 1/4/2024andnbsp10:30 A M Medical Record Number: [id number] Patient Account Number: [id number] Date of Birth/Gender: Treating RN: 27-Oct-1945 (77 y.o. [name] Primary Care Physician: Corey [name] Other Clinician: Referring Physician: Treating Physician/Extender: Mickle Asper in Treatment: 33 Education Assessment Education Provided To: Patient Education Topics Provided Wound/Skin Impairment: Methods: Explain/Verbal Responses: Reinforcements needed, State content correctly Electronic Signature(s) Signed: 05/19/2022 12:14:03 PM By: Rhae Hammock RN Entered By: Rhae Hammock on 05/18/2022 12:04:19 Corey [name] ([id number]) [id number] Page 7 of 8 -------------------------------------------------------------------------------- Wound Assessment Details Patient Name: Date of Service: Corey [name]. 05/18/2022 10:30 A M Medical Record Number: [id number] Patient Account Number: [id number] Date of Birth/Sex: Treating RN: 17-Jun-1945 (77 y.o. M) Primary Care Arvid Marengo: Corey [name] Other Clinician: Referring Shakera Ebrahimi: Treating Jamani Bearce/Extender: Mickle Asper in Treatment: 28 Wound Status Wound Number: 1 Primary Diabetic Wound/Ulcer of the Lower Extremity Etiology: Wound Location: Right, Lateral Lower Leg Wound Open Wounding Event: Gradually Appeared Status: Date Acquired: 08/15/2021 Comorbid Cataracts, Sleep Apnea, Congestive Heart Failure, Hypertension, Weeks Of Treatment: 28 History: Myocardial  Infarction, Type II Diabetes, Gout Clustered Wound: No Photos Wound Measurements Length: (cm) 1.2 Width: (cm) 0.9 Depth: (cm) 0.1 Area: (cm) 0.848 Volume: (cm) 0.085 % Reduction in Area: 96.1% % Reduction in Volume: 96.1% Epithelialization: Large (67-100%) Wound Description  Classification: Grade 2 Wound Margin: Distinct, outline attached Exudate Amount: Medium Exudate Type: Serosanguineous Exudate Color: red, brown Foul Odor After Cleansing: No Slough/Fibrino Yes Wound Bed Granulation Amount: Large (67-100%) Exposed Structure Granulation Quality: Red, Pink Fascia Exposed: No Necrotic Amount: None Present (0%) Fat Layer (Subcutaneous Tissue) Exposed: Yes Tendon Exposed: No Muscle Exposed: No Joint Exposed: No Bone Exposed: No Periwound Skin Texture Texture Color No Abnormalities Noted: No No Abnormalities Noted: No Callus: No Atrophie Blanche: No Crepitus: No Cyanosis: No Excoriation: No Ecchymosis: No Induration: No Erythema: No Rash: No Hemosiderin Staining: No Scarring: Yes Mottled: No Pallor: No Moisture Rubor: No No Abnormalities Noted: No Dry / Scaly: No Temperature / Pain Maceration: No Temperature: No Abnormality Tenderness on Palpation: [address] JYMIR, DUNAJ ([id number]) [id number] Page 8 of 8 Treatment Notes Wound #1 (Lower Leg) Wound Laterality: Right, Lateral Cleanser Soap and Water Discharge Instruction: May shower and wash wound with dial antibacterial soap and water prior to dressing change. Wound Cleanser Discharge Instruction: Cleanse the wound with wound cleanser prior to applying a clean dressing using gauze sponges, not tissue or cotton balls. Peri-Wound Care Topical Gentamicin Discharge Instruction: in clinic only Mupirocin Ointment Discharge Instruction: in clinic only Primary Dressing Hydrofera Blue Ready Transfer Foam, 4x5 (in/in) Discharge Instruction: Apply to wound bed as instructed Secondary  Dressing ABD Pad, 8x10 Discharge Instruction: Apply over primary dressing as directed. Woven Gauze Sponge, Non-Sterile 4x4 in Discharge Instruction: Apply over primary dressing as directed. Secured With Compression Wrap ThreePress (3 layer compression wrap) Discharge Instruction: Apply three layer compression as directed. Compression Stockings Add-Ons Electronic Signature(s) Signed: 05/18/2022 4:16:52 PM By: Erenest [name] Entered By: Erenest [name] on 05/18/2022 11:11:58 -------------------------------------------------------------------------------- Vitals Details Patient Name: Date of Service: Corey [name], [name] 05/18/2022 10:30 A M Medical Record Number: [id number] Patient Account Number: [id number] Date of Birth/Sex: Treating RN: 05-Feb-1946 (77 y.o. M) Primary Care Gaurav Baldree: Corey [name] Other Clinician: Referring Desmond Szabo: Treating Jennyfer Nickolson/Extender: Mickle Asper in Treatment: 28 Vital Signs Time Taken: 11:00 Temperature (F): 98.3 Height (in): 66 Pulse (bpm): 61 Weight (lbs): 145 Respiratory Rate (breaths/min): 18 Body Mass Index (BMI): 23.4 Blood Pressure (mmHg): 145/77 Reference Range: 80 - 120 mg / dl Electronic Signature(s) Signed: 05/18/2022 4:16:52 PM By: Erenest [name] Entered By: Erenest [name] on 05/18/2022 11:01:10

## 2022-05-19 NOTE — Progress Notes (Signed)
COMMUNITY PALLIATIVE CARE SW NOTE  PATIENT NAME: Corey [name] DOB: 28-Sep-1945 MRN: [id number]  PRIMARY CARE PROVIDER: [name], MD  RESPONSIBLE PARTY:  Acct ID - Guarantor Home Phone Work Phone Relationship Acct Type  [id number] Corey, [name] [phone]  Self P/F     [address], [place], [place] [address]   Palliative Care Encounter/Clinical Social Work  Person (s) encountered: patient and his wife-[name]  Purpose of the Visit: Initial palliative care visit; Joint SW and RN-PJ Livengood.  Assessment: LCSW completed a review and assessment of patient's family/social, mental health and medical history, allergies, medications, and health (functional) status, including patient self-reporting and a review of relevant consultative reports was completed today as part of a comprehensive evaluation by [facility] services.  Summary of Encounter:  Patient and his wife provided a status update on himself. 77yo male with DMII, CHF and CKD referred for palliative care. Patient report that he has increased SOB with any exertion. He weighs himself daily and his weights have ranged from 182 lbs to 180 lbs. Is patient gains three or pounds he is to contact his doctor. His increased lasix was increased. He is eating at lease 2 meals a day. His wife monitors his sodium content and often cooks his food separate. Patient report that he does sleep well at night, but this is not new. He ambulates independently, but with stumble ocassionally when he walks. He is on fluid restriction, but his wife is concern that he drinks too much. Her has a leg wound that is being treated by the wound center. He has swelling in both ankles, but right ankle is swollen more.   Social Work Interventions Provided: Education regarding palliative care services, and obtained a verbal consent to continue services; supportive counseling, teaching sheet on breathing provided by the RN;  reviewed and educated patient on MOST form and HCPOA; documents left in the home.  Collaboration/Coordination of Care:   Ongoing support and education will be provided.   Plan/Follow-up: 06/13/22 @ 11:30 am.   SOCIAL HX: Patient was born and raised in [place], [place]. He has been married to his wife for 43 years and they have two children. He has no Careers adviser. He retired from [employer].  Social History   Tobacco Use   Smoking status: Former    Packs/day: 0.25    Years: 1.00    Total pack years: 0.25    Types: Cigarettes    Quit date: 05/15/1968    Years since quitting: 54.0   Smokeless tobacco: Never   Tobacco comments:    smoked [place], up to 1-2 cigarettes/ day  Substance Use Topics   Alcohol use: No    Alcohol/week: 0.0 standard drinks of alcohol    CODE STATUS: Full Code ADVANCED DIRECTIVES: Reviewed, documents provided MOST FORM COMPLETE:  Reviewed, document provided HOSPICE EDUCATION PROVIDED: Education provided.  Duration of visit and documentation: 60 minutes  Katheren Puller, LCSW

## 2022-05-22 ENCOUNTER — Other Ambulatory Visit: Payer: Self-pay | Admitting: Internal Medicine

## 2022-05-22 ENCOUNTER — Telehealth: Payer: Self-pay | Admitting: Cardiology

## 2022-05-22 NOTE — Telephone Encounter (Signed)
Spoke with [name] (wife) who reports pt has had a 3 lb wt gain over night and is c/o shortness of breath with activity.  Advised to take (1) extra Torsemide 20 mg today and follow up with Dr Marlou Porch in the morning at 9 am as scheduled.  [name] states understanding and was appreciative of the call.

## 2022-05-22 NOTE — Telephone Encounter (Signed)
Will forward to Dr Skains for his knowledge.  °

## 2022-05-22 NOTE — Telephone Encounter (Signed)
Pt c/o swelling: STAT is pt has developed SOB within 24 hours  If swelling, where is the swelling located?  Left leg  How much weight have you gained and in what time span?   3 lbs  Have you gained 3 pounds in a day or 5 pounds in a week?  3 lbs overnight  Do you have a log of your daily weights (if so, list)?   Yes  Are you currently taking a fluid pill?   Yes  Are you currently SOB?   No  Have you traveled recently?   No   Patient is concerned he has had weight gain from 180 to 183 overnight and would like  advice on next steps.

## 2022-05-23 ENCOUNTER — Encounter (HOSPITAL_COMMUNITY): Payer: Self-pay | Admitting: Cardiology

## 2022-05-23 ENCOUNTER — Ambulatory Visit: Payer: Medicare HMO | Attending: Cardiology | Admitting: Cardiology

## 2022-05-23 ENCOUNTER — Encounter: Payer: Self-pay | Admitting: Cardiology

## 2022-05-23 ENCOUNTER — Ambulatory Visit (HOSPITAL_COMMUNITY)
Admission: RE | Admit: 2022-05-23 | Discharge: 2022-05-23 | Disposition: A | Discharge status: Home / Self Care | Payer: Medicare HMO | Source: Ambulatory Visit | Attending: Cardiology | Admitting: Cardiology

## 2022-05-23 VITALS — BP 120/88 | HR 64 | Ht 66.0 in | Wt 186.6 lb

## 2022-05-23 VITALS — BP 130/80 | HR 58 | Wt 187.0 lb

## 2022-05-23 DIAGNOSIS — I5040 Unspecified combined systolic (congestive) and diastolic (congestive) heart failure: Secondary | ICD-10-CM | POA: Insufficient documentation

## 2022-05-23 DIAGNOSIS — I25119 Atherosclerotic heart disease of native coronary artery with unspecified angina pectoris: Secondary | ICD-10-CM | POA: Diagnosis not present

## 2022-05-23 DIAGNOSIS — I132 Hypertensive heart and chronic kidney disease with heart failure and with stage 5 chronic kidney disease, or end stage renal disease: Secondary | ICD-10-CM | POA: Insufficient documentation

## 2022-05-23 DIAGNOSIS — E785 Hyperlipidemia, unspecified: Secondary | ICD-10-CM | POA: Insufficient documentation

## 2022-05-23 DIAGNOSIS — I4891 Unspecified atrial fibrillation: Secondary | ICD-10-CM | POA: Diagnosis not present

## 2022-05-23 DIAGNOSIS — E854 Organ-limited amyloidosis: Secondary | ICD-10-CM

## 2022-05-23 DIAGNOSIS — I2510 Atherosclerotic heart disease of native coronary artery without angina pectoris: Secondary | ICD-10-CM | POA: Diagnosis not present

## 2022-05-23 DIAGNOSIS — I82722 Chronic embolism and thrombosis of deep veins of left upper extremity: Secondary | ICD-10-CM

## 2022-05-23 DIAGNOSIS — I5042 Chronic combined systolic (congestive) and diastolic (congestive) heart failure: Secondary | ICD-10-CM

## 2022-05-23 DIAGNOSIS — I483 Typical atrial flutter: Secondary | ICD-10-CM

## 2022-05-23 DIAGNOSIS — Z8673 Personal history of transient ischemic attack (TIA), and cerebral infarction without residual deficits: Secondary | ICD-10-CM | POA: Diagnosis not present

## 2022-05-23 DIAGNOSIS — I43 Cardiomyopathy in diseases classified elsewhere: Secondary | ICD-10-CM | POA: Diagnosis not present

## 2022-05-23 DIAGNOSIS — I425 Other restrictive cardiomyopathy: Secondary | ICD-10-CM | POA: Insufficient documentation

## 2022-05-23 DIAGNOSIS — I480 Paroxysmal atrial fibrillation: Secondary | ICD-10-CM | POA: Diagnosis not present

## 2022-05-23 DIAGNOSIS — N185 Chronic kidney disease, stage 5: Secondary | ICD-10-CM | POA: Diagnosis not present

## 2022-05-23 DIAGNOSIS — N186 End stage renal disease: Secondary | ICD-10-CM | POA: Diagnosis not present

## 2022-05-23 DIAGNOSIS — E1136 Type 2 diabetes mellitus with diabetic cataract: Secondary | ICD-10-CM | POA: Insufficient documentation

## 2022-05-23 DIAGNOSIS — E1122 Type 2 diabetes mellitus with diabetic chronic kidney disease: Secondary | ICD-10-CM | POA: Diagnosis not present

## 2022-05-23 DIAGNOSIS — Z8674 Personal history of sudden cardiac arrest: Secondary | ICD-10-CM | POA: Diagnosis not present

## 2022-05-23 DIAGNOSIS — Z7901 Long term (current) use of anticoagulants: Secondary | ICD-10-CM | POA: Diagnosis not present

## 2022-05-23 DIAGNOSIS — N184 Chronic kidney disease, stage 4 (severe): Secondary | ICD-10-CM | POA: Diagnosis not present

## 2022-05-23 MED ORDER — TORSEMIDE 40 MG PO TABS: 40.0000 mg | ORAL_TABLET | Freq: Every day | ORAL | 3 refills | Status: DC

## 2022-05-23 NOTE — Patient Instructions (Signed)
There has been no changes to your medications.  Blood work in 1 week  Your physician recommends that you schedule a follow-up appointment in: 6 months (July 2024)  ** please call the office in April to arrange your follow up appointment **  If you have any questions or concerns before your next appointment please send [place] a message through [place] or call our office at [phone].    TO LEAVE A MESSAGE FOR THE NURSE SELECT OPTION 2, PLEASE LEAVE A MESSAGE INCLUDING: YOUR NAME DATE OF BIRTH CALL BACK NUMBER REASON FOR CALL**this is important as we prioritize the call backs  YOU WILL RECEIVE A CALL BACK THE SAME DAY AS LONG AS YOU CALL BEFORE 4:00 PM  At the [facility], you and your health needs are our priority. As part of our continuing mission to provide you with exceptional heart care, we have created designated Provider Care Teams. These Care Teams include your primary Cardiologist (physician) and Advanced Practice Providers (APPs- Physician Assistants and Nurse Practitioners) who all work together to provide you with the care you need, when you need it.   You may see any of the following providers on your designated Care Team at your next follow up: Dr Glori Bickers Dr Loralie Champagne Dr. Roxana Hires, NP Lyda Jester, [facility], [place] Forestine Na, NP Audry Riles, PharmD   Please be sure to bring in all your medications bottles to every appointment.

## 2022-05-23 NOTE — Progress Notes (Signed)
Cardiology Office Note:    Date:  05/23/2022   ID:  Corey [name], DOB 09/04/1945, MRN [id number]  PCP:  [name], MD   [place] Providers Cardiologist:  Candee Furbish, MD Electrophysiologist:  Melida Quitter, MD  Advanced heart failure: Dr. Daniel Nones  Referring MD: [name], MD    History of Present Illness:    Corey [name] is a 77 y.o. male with ATTR cardiac amyloid on tafamidis, prior large pericardial effusion status post window, CHFrEF, CAD chronic kidney disease stage V not currently on dialysis, fistula in place with atrial fibrillation and flutter   2022, [name] presented to [facility] with [facility]. LHC at that time demonstrated 80% occlusion of the LAD, however, PCI was deferred due to markedly elevated LVEDP. LVEF by echo at that time of 35%-40%. Due to a large pericardial effusion, he had a window done with biopsies at that time compatible with amyloidosis. AL evaluation was negative. PYP was equivocal. Genetic testing consistent with Val142Ile TTR.   Admitted in early November 2023 with volume overload and an atrial fibrillation occasionally organizing into fibrillation. He was diuresed, underwent TEE/DCCV x 2. When in sinus rhythm, he was often bradycardic with sinus brady and Mobitz I AV block.  Has seen Dr. Myles Gip with EP and has ablation scheduled.  Has been in touch with our office recently about increasing weights, 183.  Harder to maintain fluid balance recently.  Has been on torsemide 20 mg daily.  Occasionally will take an extra dose when directed.  Still deals with fatigue, shortness of breath with activity.  He has also been seen by the advanced heart failure clinic as well.  Appreciate their care.  Past Medical History:  Diagnosis Date   Cataract    CHF (congestive heart failure) (HCC)    Chronic heart failure with preserved ejection fraction (HFpEF) (HCC)    Colon polyp 2003   Dr [name]; F/U was to be 2008( not completed)    Coronary artery disease    CVA (cerebral infarction) 2014   Diabetes mellitus    Diverticulosis 2003   Dyspnea    with exertion   Gout    Hereditary cardiac amyloidosis (HCC)    Hypertension    Myocardial infarction ([place]) 09/19/2012   PEA cardiac arrest in setting of acute respiratory failure/pulmonary edema   OSA (obstructive sleep apnea) 03/27/2018   Pneumonia 09/29/2011   Avelox X 10 days as OP   Prostate cancer ([place])    Renal insufficiency    Seizures ([place]) 09/19/2012   not treated for seizure disorder; had a seizure after stroke 2014; no seizure since then   Stroke [facility])     Past Surgical History:  Procedure Laterality Date   A/V FISTULAGRAM Left 04/28/2022   Procedure: A/V Fistulagram;  Surgeon: Angelia Mould, MD;  Location: [place] [facility];  Service: Cardiovascular;  Laterality: Left;   AV FISTULA PLACEMENT Left 01/31/2022   Procedure: LEFT ARM ARTERIOVENOUS (AV) FISTULA CREATION;  Surgeon: Waynetta Sandy, MD;  Location: [place];  Service: Vascular;  Laterality: Left;  regional block to left arm   CARDIOVERSION N/A 03/24/2022   Procedure: CARDIOVERSION;  Surgeon: Hebert Soho, DO;  Location: [facility];  Service: Cardiovascular;  Laterality: N/A;   COLONOSCOPY W/ POLYPECTOMY  2003   no F/U ([place] discussed 12/03/12)   INGUINAL HERNIA REPAIR Left 06/16/2021   Procedure: LAPAROSCOPIC, POSSIBLY OPEN LEFT INGUINAL HERNIA REPAIR WITH MESH;  Surgeon: Louanna Raw  J, MD;  Location: [facility];  Service: General;  Laterality: Left;   INSERTION OF MESH N/A 07/16/2017   Procedure: INSERTION OF MESH;  Surgeon: [name], MD;  Location: [place];  Service: General;  Laterality: N/A;   PEG PLACEMENT  2014   10/08/12-12/10/12   PEG TUBE REMOVAL  2014   PERIPHERAL VASCULAR BALLOON ANGIOPLASTY  04/28/2022   Procedure: PERIPHERAL VASCULAR BALLOON ANGIOPLASTY;  Surgeon: Angelia Mould, MD;  Location: [place] [facility];  Service: Cardiovascular;;    PROSTATE BIOPSY     RIGHT HEART CATH N/A 03/24/2022   Procedure: RIGHT HEART CATH;  Surgeon: Hebert Soho, DO;  Location: [place] [facility];  Service: Cardiovascular;  Laterality: N/A;   RIGHT/LEFT HEART CATH AND CORONARY ANGIOGRAPHY N/A 09/20/2020   Procedure: RIGHT/LEFT HEART CATH AND CORONARY ANGIOGRAPHY;  Surgeon: Leonie Man, MD;  Location: [place] [facility];  Service: Cardiovascular;  Laterality: N/A;   TEE WITHOUT CARDIOVERSION N/A 09/27/2020   Procedure: TRANSESOPHAGEAL ECHOCARDIOGRAM (TEE);  Surgeon: Wonda Olds, MD;  Location: [facility];  Service: Thoracic;  Laterality: N/A;   TEE WITHOUT CARDIOVERSION N/A 03/24/2022   Procedure: TRANSESOPHAGEAL ECHOCARDIOGRAM (TEE);  Surgeon: Hebert Soho, DO;  Location: [facility];  Service: Cardiovascular;  Laterality: N/A;   TRACHEOSTOMY  2014   09/30/12-10/20/12   VENTRAL HERNIA REPAIR N/A 07/16/2017   Procedure: LAPAROSCOPIC VENTRAL HERNIA REPAIR WITH MESH;  Surgeon: [name], MD;  Location: [facility];  Service: General;  Laterality: N/A;   wrist aspiration  02/16/2012    monosodium urate crystals; Dr Caralyn Guile    Current Medications: Current Meds  Medication Sig   ACCU-CHEK AVIVA PLUS test strip TEST BLOOD SUGAR TWICE DAILY AS DIRECTED   acetaminophen (TYLENOL) 500 MG tablet Take 500-1,000 mg by mouth every 6 (six) hours as needed.   albuterol (VENTOLIN HFA) 108 (90 Base) MCG/ACT inhaler Inhale 2 puffs into the lungs every 4 (four) hours as needed for wheezing or shortness of breath.   allopurinol (ZYLOPRIM) 100 MG tablet Take 50 mg by mouth in the morning.   apixaban (ELIQUIS) 5 MG TABS tablet Take 1 tablet (5 mg total) by mouth 2 (two) times daily.   bismuth subsalicylate (PEPTO BISMOL) 262 MG/15ML suspension Take 30 mLs by mouth every 6 (six) hours as needed for indigestion.   colchicine (COLCRYS) 0.6 MG tablet Take 2 tabs once and then one tab one hour later as needed for gout   ergocalciferol (VITAMIN D2) 1.25 MG  (50000 UT) capsule Take 50,000 Units by mouth every Monday. Monday   hydrALAZINE (APRESOLINE) 25 MG tablet Take 1 tablet (25 mg total) by mouth 3 (three) times daily.   rosuvastatin (CRESTOR) 5 MG tablet Take 1 tablet (5 mg total) by mouth daily.   sodium bicarbonate 650 MG tablet Take 1 tablet by mouth twice daily   Tafamidis (VYNDAMAX) 61 MG CAPS Take 61 mg by mouth daily.   Torsemide 40 MG TABS Take 40 mg by mouth daily.   TRUEplus Lancets 33G MISC USE 4 (FOUR) TIMES DAILY   [DISCONTINUED] torsemide (DEMADEX) 20 MG tablet Take 1 tablet (20 mg total) by mouth daily.     Allergies:   Hydrochlorothiazide   Social History   Socioeconomic History   Marital status: Married    Spouse name: [name]   Number of children: 2   Years of education: 12   Highest education level: 12th grade  Occupational History   Not on file  Tobacco Use  Smoking status: Former    Packs/day: 0.25    Years: 1.00    Total pack years: 0.25    Types: Cigarettes    Quit date: 05/15/1968    Years since quitting: 54.0   Smokeless tobacco: Never   Tobacco comments:    smoked [place], up to 1-2 cigarettes/ day  Vaping Use   Vaping Use: Never used  Substance and Sexual Activity   Alcohol use: No    Alcohol/week: 0.0 standard drinks of alcohol   Drug use: No   Sexual activity: Yes  Other Topics Concern   Not on file  Social History Narrative   Walks three times a week, 2-3 miles, occasional walks on treadmill       Patient is right-handed. He lives with his wife in a one level home. He drinks 1 diet Mt. Dew a day and rarely has coffee. He walks 3 x a week for exercise.   Social Determinants of Health   Financial Resource Strain: Low Risk  (02/02/2022)   Overall Financial Resource Strain (CARDIA)    Difficulty of Paying Living Expenses: Not hard at all  Food Insecurity: No Food Insecurity (02/02/2022)   Hunger Vital Sign    Worried About Running Out of Food in the Last Year: Never true    Ran Out of  Food in the Last Year: Never true  Transportation Needs: No Transportation Needs (05/01/2022)   PRAPARE - Hydrologist (Medical): No    Lack of Transportation (Non-Medical): No  Physical Activity: Inactive (02/02/2022)   Exercise Vital Sign    Days of Exercise per Week: 0 days    Minutes of Exercise per Session: 0 min  Stress: No Stress Concern Present (02/02/2022)   [place]    Feeling of Stress : Not at all  Social Connections: [place] (02/02/2022)   Social Connection and Isolation Panel [NHANES]    Frequency of Communication with Friends and Family: More than three times a week    Frequency of Social Gatherings with Friends and Family: More than three times a week    Attends Religious Services: More than 4 times per year    Active Member of [employer] or Organizations: Yes    Attends Music therapist: More than 4 times per year    Marital Status: Married     Family History: The patient's family history includes Cancer in his father; Diabetes in his father and mother; Heart failure in his brother and mother; Hypertension in his father and mother; Prostate cancer in his father. There is no history of Stroke, Heart disease, or Colon cancer.  ROS:   Please see the history of present illness.     All other systems reviewed and are negative.  EKGs/Labs/Other Studies Reviewed:    The following studies were reviewed today:  ECG:AFL w/ variable conduction, slow ventricular rate   ECHO: 13' echo: normal EF, severe LVH,G2DD, suggestive for cardiac amyloidosis  13' cMRI: less suggestive of amyloid and more consistent with non obstructive HOCM  27' echo: 55-60%, severe LVH, mod pericardial effusion 19' echo: EF 55-60%, G3DD, moderate pericardial effusion  5/22: ECHO with EF 35-40%, G2DD, RWMA, Large, pericardial effusion  5/22: Underwent pericardial window procedure for his  pericardial effusion and biopsies were taken which were consistent with amyloidosis, suggestive of possible AL amyloidosis  8/22: PYP: Findings are equivocal for TTR amyloidosis      CATH: 5/22:  L/RHC: single vessel CAD 80% LAD occlusion with severely elevated LVEDP     Recent Labs: 01/05/2022: ALT 31; Pro B Natriuretic peptide (BNP) 1,740.0 03/22/2022: B Natriuretic Peptide 1,571.3 03/31/2022: Platelets 122 04/28/2022: Hemoglobin 11.9 05/11/2022: BUN 68; Creatinine, Ser 3.64; Potassium 4.0; Sodium 141  Recent Lipid Panel    Component Value Date/Time   CHOL 86 03/23/2022 1615   TRIG 54 03/23/2022 1615   HDL 44 03/23/2022 1615   CHOLHDL 2.0 03/23/2022 1615   VLDL 11 03/23/2022 1615   LDLCALC 31 03/23/2022 1615   LDLCALC 61 01/28/2020 1000     Risk Assessment/Calculations:    CHA2DS2-VASc Score = 6   This indicates a 9.7% annual risk of stroke. The patient's score is based upon: CHF History: 1 HTN History: 1 Diabetes History: 1 Stroke History: 2 Vascular Disease History: 0 Age Score: 1 Gender Score: 0               Physical Exam:    VS:  BP 120/88   Pulse 64   Ht '5\' 6"'$  (1.676 m)   Wt 186 lb 9.6 oz (84.6 kg)   SpO2 99%   BMI 30.12 kg/m     Wt Readings from Last 3 Encounters:  05/23/22 187 lb (84.8 kg)  05/23/22 186 lb 9.6 oz (84.6 kg)  05/16/22 187 lb 3.2 oz (84.9 kg)     GEN:  Well nourished, well developed in no acute distress HEENT: Normal NECK: No JVD; No carotid bruits LYMPHATICS: No lymphadenopathy CARDIAC: RRR, no murmurs, rubs, gallops RESPIRATORY:  Clear to auscultation without rales, wheezing or rhonchi  ABDOMEN: Soft, non-tender, non-distended MUSCULOSKELETAL: Moderate lower extremity edema, wound healing; No deformity  SKIN: Warm and dry NEUROLOGIC:  Alert and oriented x 3 PSYCHIATRIC:  Normal affect   ASSESSMENT:    1. Chronic combined systolic and diastolic heart failure (HCC)   2. Paroxysmal atrial fibrillation ([place])   3.  Coronary artery disease involving native coronary artery of native heart without angina pectoris   4. Cardiac amyloidosis ([place])   5. Chronic kidney disease, stage V (HCC)    PLAN:    In order of problems listed above:  Chronic systolic/diastolic heart failure secondary to amyloidosis, EF 30 to 35% with severe LVH - We will go ahead and increase his torsemide from 20 mg up to 40 mg.  Willing to tolerate an increase in creatinine.  He is clearly holding onto fluid.  Discussed with wife. -No beta-blocker in the setting of restrictive cardiomyopathy  ATTR amyloidosis - Continue with tafamidis.  Encourage daily movement, exercise. -Val142Ile TTR   Atrial fibrillation/flutter - Appreciate Dr. Myles Gip consultation.  Planned ablation in February.  Chronic anticoagulation - Continue with Eliquis.  No signs of bleeding.  Coronary artery disease -80% LAD lesion noted in 2022 cardiac catheterization.  Not having any chest pain or anginal symptoms at that time.  Avoided further left heart catheterization in the setting of progressively worsened renal failure.  Could consider coronary intervention when committed to potential hemodialysis.  Chronic kidney disease stage V -Followed by nephrology as well.  Fistula is present and maturing.  He is not currently on hemodialysis. -Increasing torsemide to 40 mg daily. -Dietary modifications.  Previously elevated troponin - Secondary to myocardial injury in the setting of amyloidosis/heart failure  Prior pericardial effusion - Post window.  Right leg wound - Wound care center.  Doing better.         Medication Adjustments/Labs and Tests Ordered: Current medicines are reviewed  at length with the patient today.  Concerns regarding medicines are outlined above.  No orders of the defined types were placed in this encounter.  Meds ordered this encounter  Medications   Torsemide 40 MG TABS    Sig: Take 40 mg by mouth daily.    Dispense:  90  tablet    Refill:  3    Patient Instructions  Medication Instructions:  Your physician has recommended you make the following change in your medication:  INCREASE TORSEMIDE 40 MG DAILY.    *If you need a refill on your cardiac medications before your next appointment, please call your pharmacy*   Lab Work: NONE If you have labs (blood work) drawn today and your tests are completely normal, you will receive your results only by: [place] (if you have MyChart) OR A paper copy in the mail If you have any lab test that is abnormal or we need to change your treatment, we will call you to review the results.   Testing/Procedures: NONE   Follow-Up: At [facility], you and your health needs are our priority.  As part of our continuing mission to provide you with exceptional heart care, we have created designated Provider Care Teams.  These Care Teams include your primary Cardiologist (physician) and Advanced Practice Providers (APPs -  Physician Assistants and Nurse Practitioners) who all work together to provide you with the care you need, when you need it.  We recommend signing up for the patient portal called "MyChart".  Sign up information is provided on this After Visit Summary.  MyChart is used to connect with patients for Virtual Visits (Telemedicine).  Patients are able to view lab/test results, encounter notes, upcoming appointments, etc.  Non-urgent messages can be sent to your provider as well.   To learn more about what you can do with MyChart, go to [email or web address].    Your next appointment:   4 month(s)  The format for your next appointment:   In Person  Provider:   Candee Furbish, MD   Important Information About Sugar         Signed, Candee Furbish, MD  05/23/2022 10:54 AM    [place]

## 2022-05-23 NOTE — Progress Notes (Signed)
ADVANCED HEART FAILURE CLINIC NOTE  Referring Physician: [name], MD  Primary Care: [name], MD Primary Cardiologist: Dr. Marlou Porch  HPI: Mr. Rando is a 77 year old African American male with cardiac amyloid (ATTR) on tafamidis, history of PEA arrest, pericardial effusion s/p window, hypertension, hyperlipidemia, CKD now progressing towards ESRD, type 2 diabetes, history of CVA and recently diagnosed atrial fibrillation/AFL presenting today to establish care after recent hospitalization.    In 2022, Mr. Imperato presented to [facility] with [place]. LHC at that time demonstrated 80% occlusion of the LAD, however, PCI was deferred due to markedly elevated LVEDP. LVEF by echo at that time of 35%-40%. Due to a large pericardial effusion, he had a window done with biopsies at that time compatible with amyloidosis. AL evaluation was negative. PYP was equivocal. Genetic testing consistent with Val142Ile TTR. Since that time he has followed closely with Dr. Marlou Porch. According to [name] see shortly after being diagnosed with atrial fibrillation he has noted continued decline in functional status and worsening lower extremity edema.  He was admitted in early November 2023 with volume overload and atrial flutter. He was diuresed, underwent TEE/DCCV x 2 with deteroriation to AFL shortly after. In addition, after diuresis underwent RHC that confirmed low filling pressures, compensated output.   Mr. Olivar presents today for follow up. Unfortunately today he feels poorly due to SOB and LE edema. He was seen by Dr. Marlou Porch today who uptitrated torsemide to BID dosing. In addition, he was seen by vascular surgery in 12/23 and is now s/p balloon angioplasty of LUE fistula. Unfortunately, he still has significant swelling in the left forearm 2/2 clot. Other than shortness of breath and edema no other complaints.   Activity level/exercise tolerance:  NYHA III Orthopnea:  Sleeps on 2-3  pillows Paroxysmal noctural dyspnea:  Yes, infrequent; recently worse.  Chest pain/pressure:  NO Orthostatic lightheadedness:  NO Palpitations:  yes Lower extremity edema:  Yes Presyncope/syncope:  no Cough:  no  Past Medical History:  Diagnosis Date   Cataract    CHF (congestive heart failure) (HCC)    Chronic heart failure with preserved ejection fraction (HFpEF) ([place])    Colon polyp 2003   Dr [name]; F/U was to be 2008( not completed)   Coronary artery disease    CVA (cerebral infarction) 2014   Diabetes mellitus    Diverticulosis 2003   Dyspnea    with exertion   Gout    Hereditary cardiac amyloidosis (HCC)    Hypertension    Myocardial infarction ([place]) 09/19/2012   PEA cardiac arrest in setting of acute respiratory failure/pulmonary edema   OSA (obstructive sleep apnea) 03/27/2018   Pneumonia 09/29/2011   Avelox X 10 days as OP   Prostate cancer ([place])    Renal insufficiency    Seizures ([place]) 09/19/2012   not treated for seizure disorder; had a seizure after stroke 2014; no seizure since then   Stroke [facility])     Current Outpatient Medications  Medication Sig Dispense Refill   ACCU-CHEK AVIVA PLUS test strip TEST BLOOD SUGAR TWICE DAILY AS DIRECTED 200 strip 1   acetaminophen (TYLENOL) 500 MG tablet Take 500-1,000 mg by mouth every 6 (six) hours as needed.     albuterol (VENTOLIN HFA) 108 (90 Base) MCG/ACT inhaler Inhale 2 puffs into the lungs every 4 (four) hours as needed for wheezing or shortness of breath. 18 g 1   allopurinol (ZYLOPRIM) 100 MG tablet Take 50 mg by mouth in  the morning.     apixaban (ELIQUIS) 5 MG TABS tablet Take 1 tablet (5 mg total) by mouth 2 (two) times daily. 60 tablet 6   bismuth subsalicylate (PEPTO BISMOL) 262 MG/15ML suspension Take 30 mLs by mouth every 6 (six) hours as needed for indigestion.     colchicine (COLCRYS) 0.6 MG tablet Take 2 tabs once and then one tab one hour later as needed for gout 15 tablet 2   ergocalciferol  (VITAMIN D2) 1.25 MG (50000 UT) capsule Take 50,000 Units by mouth every Monday. Monday     hydrALAZINE (APRESOLINE) 25 MG tablet Take 1 tablet (25 mg total) by mouth 3 (three) times daily. 90 tablet 0   rosuvastatin (CRESTOR) 5 MG tablet Take 1 tablet (5 mg total) by mouth daily. 90 tablet 3   sodium bicarbonate 650 MG tablet Take 1 tablet by mouth twice daily 60 tablet 0   Tafamidis (VYNDAMAX) 61 MG CAPS Take 61 mg by mouth daily. 30 capsule 12   Torsemide 40 MG TABS Take 40 mg by mouth daily. 90 tablet 3   TRUEplus Lancets 33G MISC USE 4 (FOUR) TIMES DAILY 400 each 2   No current facility-administered medications for this encounter.    Allergies  Allergen Reactions   Hydrochlorothiazide Other (See Comments)    Gout , uncontrolled diabetes and renal insufficiency      Social History   Socioeconomic History   Marital status: Married    Spouse name: [name]   Number of children: 2   Years of education: 12   Highest education level: 12th grade  Occupational History   Not on file  Tobacco Use   Smoking status: Former    Packs/day: 0.25    Years: 1.00    Total pack years: 0.25    Types: Cigarettes    Quit date: 05/15/1968    Years since quitting: 54.0   Smokeless tobacco: Never   Tobacco comments:    smoked [place], up to 1-2 cigarettes/ day  Vaping Use   Vaping Use: Never used  Substance and Sexual Activity   Alcohol use: No    Alcohol/week: 0.0 standard drinks of alcohol   Drug use: No   Sexual activity: Yes  Other Topics Concern   Not on file  Social History Narrative   Walks three times a week, 2-3 miles, occasional walks on treadmill       Patient is right-handed. He lives with his wife in a one level home. He drinks 1 diet Mt. Dew a day and rarely has coffee. He walks 3 x a week for exercise.   Social Determinants of Health   Financial Resource Strain: Low Risk  (02/02/2022)   Overall Financial Resource Strain (CARDIA)    Difficulty of Paying Living  Expenses: Not hard at all  Food Insecurity: No Food Insecurity (02/02/2022)   Hunger Vital Sign    Worried About Running Out of Food in the Last Year: Never true    Ran Out of Food in the Last Year: Never true  Transportation Needs: No Transportation Needs (05/01/2022)   PRAPARE - Hydrologist (Medical): No    Lack of Transportation (Non-Medical): No  Physical Activity: Inactive (02/02/2022)   Exercise Vital Sign    Days of Exercise per Week: 0 days    Minutes of Exercise per Session: 0 min  Stress: No Stress Concern Present (02/02/2022)   [place]  Feeling of Stress : Not at all  Social Connections: Socially Integrated (02/02/2022)   Social Connection and Isolation Panel [NHANES]    Frequency of Communication with Friends and Family: More than three times a week    Frequency of Social Gatherings with Friends and Family: More than three times a week    Attends Religious Services: More than 4 times per year    Active Member of [employer] or Organizations: Yes    Attends Music therapist: More than 4 times per year    Marital Status: Married  Human resources officer Violence: Not At Risk (02/02/2022)   Humiliation, Afraid, Rape, and Kick questionnaire    Fear of Current or Ex-Partner: No    Emotionally Abused: No    Physically Abused: No    Sexually Abused: No      Family History  Problem Relation Age of Onset   Heart failure Mother    Hypertension Mother    Diabetes Mother    Hypertension Father    Prostate cancer Father    Diabetes Father    Cancer Father    Heart failure Brother    Stroke Neg Hx    Heart disease Neg Hx    Colon cancer Neg Hx     PHYSICAL EXAM: Vitals:   05/23/22 1016  BP: 130/80  Pulse: (!) 58  SpO2: 96%   GENERAL: Well nourished, well developed, and in no apparent distress at rest.  HEENT: Negative for arcus senilis or xanthelasma. There is no  scleral icterus.  The mucous membranes are pink and moist.   NECK: Supple, No masses. Normal carotid upstrokes without bruits. No masses or thyromegaly.    CHEST: There are no chest wall deformities. There is no chest wall tenderness. Respirations are unlabored.  Lungs- CTA B/L CARDIAC:  JVP: 9-10 w/ V waves            bradycardic with irregular rhythm. No murmurs, rubs or gallops.  Pulses are 2+ and symmetrical in upper and lower extremities. 1+ edema.  ABDOMEN: Soft, non-tender, non-distended. There are no masses or hepatomegaly. There are normal bowel sounds.  EXTREMITIES: Warm and well perfused with no cyanosis, clubbing.  LYMPHATIC: No axillary or supraclavicular lymphadenopathy.  NEUROLOGIC: Patient is oriented x3 with no focal or lateralizing neurologic deficits.  PSYCH: Patients affect is appropriate, there is no evidence of anxiety or depression.  SKIN: Warm and dry; no lesions or wounds.   DATA REVIEW  ECG:AFL w/ variable conduction, slow ventricular rate  ECHO: 13' echo: normal EF, severe LVH,G2DD, suggestive for cardiac amyloidosis  13' cMRI: less suggestive of amyloid and more consistent with non obstructive HOCM  68' echo: 55-60%, severe LVH, mod pericardial effusion 19' echo: EF 55-60%, G3DD, moderate pericardial effusion  5/22: ECHO with EF 35-40%, G2DD, RWMA, Large, pericardial effusion  5/22: Underwent pericardial window procedure for his pericardial effusion and biopsies were taken which were consistent with amyloidosis, suggestive of possible AL amyloidosis  8/22: PYP: Findings are equivocal for TTR amyloidosis    CATH: 5/22: L/RHC: single vessel CAD 80% LAD occlusion with severely elevated LVEDP    ASSESSMENT & PLAN:  Heart Failure with reduced ejection fraction secondary to amyloidosis  Etiology of HF:  Progression of underlying amyloid; does have a history of significant LAD stenosis by LHC in 5/22, however, at this time I do not believe that his CAD is the  primary culprit behind is reduced LVEF. Plan for AFL ablation in early 2/24.  NYHA class /  AHA Stage:III Volume status & Diuretics: Hypervolemic today; torsemide increased by Dr. Marlou Porch. Will plan on repeat labs next week.  Vasodilators:hydralazine '25mg'$  TID, approaching ESRD. Will hold off on ARNI/ACE/ARB at this time. Some improvement in sCr over the past few weeks. Continue to monitor closely.  Beta-Blocker:contraindicated at this time in the setting of restrictive cardiomyopathy (amyloid), slow ventricular rate in the setting of AFL.  MRA:N/A Cardiometabolic:GFR too low  Devices therapies & Valvulopathies: continue to monitor LVEF, may benefit from ICD in the future Advanced therapies:Not indicated.   2. ATTR amyloid  - Followed by Dr. Marlou Porch  - Tafamadis  - Val142Ile TTR - No significant polyneuropathy   3. CAD - 80% LAD lesion in 2022 - If he starts to show signs of angina will consider PCI; avoiding LHC at this time in the setting of progressively worsening renal failure. Will discuss prior cath with interventional colleagues.   4. CKD IV-V, approaching ESRD - followed closely by nephrology  - AVF in place   5. AFL - Plan for ablation in February 2024 by Gus Mealor  6. LUE swelling  - 2/2 thrombus in outflow vein of distal upper arm and antecubital fossa - apixaban   Riki Gehring Advanced Heart Failure Mechanical Circulatory Support

## 2022-05-23 NOTE — Patient Instructions (Signed)
Medication Instructions:  Your physician has recommended you make the following change in your medication:  INCREASE TORSEMIDE 40 MG DAILY.    *If you need a refill on your cardiac medications before your next appointment, please call your pharmacy*   Lab Work: NONE If you have labs (blood work) drawn today and your tests are completely normal, you will receive your results only by: [place] (if you have MyChart) OR A paper copy in the mail If you have any lab test that is abnormal or we need to change your treatment, we will call you to review the results.   Testing/Procedures: NONE   Follow-Up: At [facility], you and your health needs are our priority.  As part of our continuing mission to provide you with exceptional heart care, we have created designated Provider Care Teams.  These Care Teams include your primary Cardiologist (physician) and Advanced Practice Providers (APPs -  Physician Assistants and Nurse Practitioners) who all work together to provide you with the care you need, when you need it.  We recommend signing up for the patient portal called "MyChart".  Sign up information is provided on this After Visit Summary.  MyChart is used to connect with patients for Virtual Visits (Telemedicine).  Patients are able to view lab/test results, encounter notes, upcoming appointments, etc.  Non-urgent messages can be sent to your provider as well.   To learn more about what you can do with MyChart, go to [email or web address].    Your next appointment:   4 month(s)  The format for your next appointment:   In Person  Provider:   Candee Furbish, MD   Important Information About Sugar

## 2022-05-24 ENCOUNTER — Encounter: Payer: Self-pay | Admitting: Speech Pathology

## 2022-05-24 ENCOUNTER — Ambulatory Visit: Payer: Medicare HMO | Admitting: Speech Pathology

## 2022-05-24 DIAGNOSIS — R809 Proteinuria, unspecified: Secondary | ICD-10-CM | POA: Diagnosis not present

## 2022-05-24 DIAGNOSIS — I130 Hypertensive heart and chronic kidney disease with heart failure and stage 1 through stage 4 chronic kidney disease, or unspecified chronic kidney disease: Secondary | ICD-10-CM | POA: Diagnosis not present

## 2022-05-24 DIAGNOSIS — R41841 Cognitive communication deficit: Secondary | ICD-10-CM

## 2022-05-24 DIAGNOSIS — N184 Chronic kidney disease, stage 4 (severe): Secondary | ICD-10-CM | POA: Diagnosis not present

## 2022-05-24 DIAGNOSIS — I3139 Other pericardial effusion (noninflammatory): Secondary | ICD-10-CM | POA: Diagnosis not present

## 2022-05-24 DIAGNOSIS — I5042 Chronic combined systolic (congestive) and diastolic (congestive) heart failure: Secondary | ICD-10-CM | POA: Diagnosis not present

## 2022-05-24 DIAGNOSIS — E1122 Type 2 diabetes mellitus with diabetic chronic kidney disease: Secondary | ICD-10-CM | POA: Diagnosis not present

## 2022-05-24 DIAGNOSIS — E8581 Light chain (AL) amyloidosis: Secondary | ICD-10-CM | POA: Diagnosis not present

## 2022-05-24 DIAGNOSIS — N281 Cyst of kidney, acquired: Secondary | ICD-10-CM | POA: Diagnosis not present

## 2022-05-24 DIAGNOSIS — N2581 Secondary hyperparathyroidism of renal origin: Secondary | ICD-10-CM | POA: Diagnosis not present

## 2022-05-24 DIAGNOSIS — N179 Acute kidney failure, unspecified: Secondary | ICD-10-CM | POA: Diagnosis not present

## 2022-05-24 NOTE — Therapy (Deleted)
OUTPATIENT SPEECH LANGUAGE PATHOLOGY EVALUATION   Patient Name: Corey [name] MRN: [id number] DOB:March 29, 1946, 77 y.o., male Today's Date: 05/24/2022  PCP: [name]., MD REFERRING PROVIDER: [name]., MD  END OF SESSION:   Past Medical History:  Diagnosis Date   Cataract    CHF (congestive heart failure) ([place])    Chronic heart failure with preserved ejection fraction (HFpEF) ([place])    Colon polyp 2003   Dr [name]; F/U was to be 2008( not completed)   Coronary artery disease    CVA (cerebral infarction) 2014   Diabetes mellitus    Diverticulosis 2003   Dyspnea    with exertion   Gout    Hereditary cardiac amyloidosis ([place])    Hypertension    Myocardial infarction ([place]) 09/19/2012   PEA cardiac arrest in setting of acute respiratory failure/pulmonary edema   OSA (obstructive sleep apnea) 03/27/2018   Pneumonia 09/29/2011   Avelox X 10 days as OP   Prostate cancer ([place])    Renal insufficiency    Seizures ([place]) 09/19/2012   not treated for seizure disorder; had a seizure after stroke 2014; no seizure since then   Stroke [facility])    Past Surgical History:  Procedure Laterality Date   A/V FISTULAGRAM Left 04/28/2022   Procedure: A/V Fistulagram;  Surgeon: Angelia Mould, MD;  Location: [place] [facility];  Service: Cardiovascular;  Laterality: Left;   AV FISTULA PLACEMENT Left 01/31/2022   Procedure: LEFT ARM ARTERIOVENOUS (AV) FISTULA CREATION;  Surgeon: Waynetta Sandy, MD;  Location: [facility];  Service: Vascular;  Laterality: Left;  regional block to left arm   CARDIOVERSION N/A 03/24/2022   Procedure: CARDIOVERSION;  Surgeon: Hebert Soho, DO;  Location: [facility];  Service: Cardiovascular;  Laterality: N/A;   COLONOSCOPY W/ POLYPECTOMY  2003   no F/U ([place] discussed 12/03/12)   INGUINAL HERNIA REPAIR Left 06/16/2021   Procedure: LAPAROSCOPIC, POSSIBLY OPEN LEFT INGUINAL HERNIA REPAIR WITH MESH;  Surgeon: Felicie Morn, MD;   Location: [facility];  Service: General;  Laterality: Left;   INSERTION OF MESH N/A 07/16/2017   Procedure: INSERTION OF MESH;  Surgeon: [name], MD;  Location: [facility];  Service: General;  Laterality: N/A;   PEG PLACEMENT  2014   10/08/12-12/10/12   PEG TUBE REMOVAL  2014   PERIPHERAL VASCULAR BALLOON ANGIOPLASTY  04/28/2022   Procedure: PERIPHERAL VASCULAR BALLOON ANGIOPLASTY;  Surgeon: Angelia Mould, MD;  Location: [place] [facility];  Service: Cardiovascular;;   PROSTATE BIOPSY     RIGHT HEART CATH N/A 03/24/2022   Procedure: RIGHT HEART CATH;  Surgeon: Hebert Soho, DO;  Location: [place] [facility];  Service: Cardiovascular;  Laterality: N/A;   RIGHT/LEFT HEART CATH AND CORONARY ANGIOGRAPHY N/A 09/20/2020   Procedure: RIGHT/LEFT HEART CATH AND CORONARY ANGIOGRAPHY;  Surgeon: Leonie Man, MD;  Location: [place] [facility];  Service: Cardiovascular;  Laterality: N/A;   TEE WITHOUT CARDIOVERSION N/A 09/27/2020   Procedure: TRANSESOPHAGEAL ECHOCARDIOGRAM (TEE);  Surgeon: Wonda Olds, MD;  Location: [facility];  Service: Thoracic;  Laterality: N/A;   TEE WITHOUT CARDIOVERSION N/A 03/24/2022   Procedure: TRANSESOPHAGEAL ECHOCARDIOGRAM (TEE);  Surgeon: Hebert Soho, DO;  Location: [place] [facility];  Service: Cardiovascular;  Laterality: N/A;   TRACHEOSTOMY  2014   09/30/12-10/20/12   VENTRAL HERNIA REPAIR N/A 07/16/2017   Procedure: LAPAROSCOPIC VENTRAL HERNIA REPAIR WITH MESH;  Surgeon: [name], MD;  Location: [place];  Service: General;  Laterality: N/A;  wrist aspiration  02/16/2012    monosodium urate crystals; Dr Caralyn Guile   Patient Active Problem List   Diagnosis Date Noted   Bilateral low back pain without sciatica 04/10/2022   Acute on chronic congestive heart failure ([place]) 03/25/2022   Acute on chronic combined systolic and diastolic heart failure ([place]) 03/23/2022   Paroxysmal atrial fibrillation ([place]) 01/12/2022   Acute cough 01/05/2022   Bilateral  edema of lower extremity 01/05/2022   Leg wound, right, initial encounter 10/24/2021   Weakness of both lower extremities 10/24/2021   Chronic kidney disease (CKD), stage IV (severe) ([place]) 09/29/2021   Pure hypercholesterolemia 07/05/2021   Preoperative cardiovascular examination 04/04/2021   HFrEF (heart failure with reduced ejection fraction) ([place]) 04/03/2021   Bilateral hearing loss 01/04/2021   Bilateral impacted cerumen 01/04/2021   Tympanic membrane perforation, left 01/04/2021   Physical deconditioning 10/16/2020   Hereditary cardiac amyloidosis ([place]) 09/30/2020   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   Progressive angina ([place]) - Atypical; DOE 09/20/2020   CAD (coronary artery disease) 09/20/2020   Soft tissue mass 07/30/2020   Contracture of joint of finger of left hand 07/30/2020   DOE (dyspnea on exertion) 03/01/2019   Mild cognitive impairment 12/05/2018   Muscle twitching 07/23/2018   OSA (obstructive sleep apnea) 03/27/2018   Snores 01/21/2018   Poor balance 01/23/2017   Ankle swelling, left 09/06/2016   Bradycardia 01/28/2016   Weak urinary stream 12/31/2015   Ventral hernia without obstruction or gangrene 09/15/2015   Prostate cancer ([place]) 07/25/2015   Erectile dysfunction 05/04/2015   Urinary frequency 05/04/2015   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   Abnormal finding on MRI of brain 02/14/2015   Leg wound, right 01/08/2013   PEA (Pulseless electrical activity) ([place]) 12/03/2012   Diabetes ([place]) 06/30/2011   Gout 03/05/2007   Essential hypertension 03/05/2007    ONSET DATE: referral 04-27-22   REFERRING DIAG: G31.84 (ICD-10-CM) - Mild cognitive impairment   THERAPY DIAG:  No diagnosis found.  Rationale for Evaluation and Treatment: Habilitation  SUBJECTIVE:   SUBJECTIVE STATEMENT: *** Pt accompanied by: {accompnied:27141}  PERTINENT HISTORY: hospitalized in Nov 2023 for acute on chronic combined systolic and diastolic  HF. Supported with in home skilled nursing, who have requested ST referral with concerns for memory.   PAIN:  Are you having pain? {OPRCPAIN:27236}  FALLS: Has patient fallen in last 6 months?  {ULAGTXMI:68032}  LIVING ENVIRONMENT: Lives with: lives with their spouse Lives in: House/apartment  PLOF:  Level of assistance: {ZYYQMGN:00370} Employment: {SLPemployment:25674}  PATIENT GOALS: ***  OBJECTIVE:   DIAGNOSTIC FINDINGS: ***  COGNITION: Overall cognitive status: {cognition:24006} Areas of impairment:  {cognitiveimpairmentslp:27409} Functional deficits: ***  COGNITIVE COMMUNICATION: Following directions: {commands:24018}  Auditory comprehension: {WFL-Impaired:25365} Verbal expression: {WFL-Impaired:25365} Functional communication: {WFL-Impaired:25365}  ORAL MOTOR EXAMINATION: Overall status: {OMESLP2:27645} Comments: ***  STANDARDIZED ASSESSMENTS: {SLPstandardizedassessment:27092}  PATIENT REPORTED OUTCOME MEASURES (PROM): {SLPPROM:27095}   TODAY'S TREATMENT:  DATE: ***   PATIENT EDUCATION: Education details: *** Person educated: {Person educated:25204} Education method: {Education Method:25205} Education comprehension: {Education Comprehension:25206}   GOALS: Goals reviewed with patient? {yes/no:20286}  SHORT TERM GOALS: Target date: ***  *** Baseline: Goal status: {GOALSTATUS:25110}  2.  *** Baseline:  Goal status: {GOALSTATUS:25110}  3.  *** Baseline:  Goal status: {GOALSTATUS:25110}  4.  *** Baseline:  Goal status: {GOALSTATUS:25110}  5.  *** Baseline:  Goal status: {GOALSTATUS:25110}  6.  *** Baseline:  Goal status: {GOALSTATUS:25110}  LONG TERM GOALS: Target date: ***  *** Baseline:  Goal status: {GOALSTATUS:25110}  2.  *** Baseline:  Goal status: {GOALSTATUS:25110}  3.   *** Baseline:  Goal status: {GOALSTATUS:25110}  4.  *** Baseline:  Goal status: {GOALSTATUS:25110}  5.  *** Baseline:  Goal status: {GOALSTATUS:25110}  6.  *** Baseline:  Goal status: {GOALSTATUS:25110}  ASSESSMENT:  CLINICAL IMPRESSION: Patient is a *** y.o. *** who was seen today for ***.   OBJECTIVE IMPAIRMENTS: include {SLPOBJIMP:27107}. These impairments are limiting patient from {SLPLIMIT:27108}. Factors affecting potential to achieve goals and functional outcome are {SLP factors:25450}.. Patient will benefit from skilled SLP services to address above impairments and improve overall function.  REHAB POTENTIAL: {rehabpotential:25112}  PLAN:  SLP FREQUENCY: {rehab frequency:25116}  SLP DURATION: {rehab duration:25117}  PLANNED INTERVENTIONS: {SLP treatment/interventions:25449}    Su Monks, CCC-SLP 05/24/2022, 7:57 AM

## 2022-05-24 NOTE — Telephone Encounter (Signed)
Pt was seen in office 05/23/22 - please see that note for further docementation

## 2022-05-24 NOTE — Therapy (Signed)
[facility] [address] [place], [place], [address] Phone: [phone]   Fax:  [phone]  Patient Details  Name: Corey [name] MRN: [id number] Date of Birth: [birth year] Referring Provider:  [name], MD  Encounter Date: 05/24/2022  Pt and spouse arrived for ST evaluation and report that pt is already receiving home health ST services 2x/week. SLP advised we are unable to evaluate or treat with pt already receiving same services from another provider. Pt and spouse endorse understanding. Advised will need new referral should needs change and this becomes appropriate venue to meet ongoing ST needs.   Su Monks, CCC-SLP 05/24/2022, 11:22 AM  [facility] [address] [place], [place], [address] Phone: [phone]   Fax:  [phone]

## 2022-05-30 ENCOUNTER — Telehealth (HOSPITAL_COMMUNITY): Payer: Self-pay | Admitting: *Deleted

## 2022-05-30 ENCOUNTER — Ambulatory Visit (HOSPITAL_COMMUNITY)
Admission: RE | Admit: 2022-05-30 | Discharge: 2022-05-30 | Disposition: A | Discharge status: Home / Self Care | Payer: Medicare HMO | Source: Ambulatory Visit | Attending: Internal Medicine | Admitting: Internal Medicine

## 2022-05-30 DIAGNOSIS — I5042 Chronic combined systolic (congestive) and diastolic (congestive) heart failure: Secondary | ICD-10-CM | POA: Insufficient documentation

## 2022-05-30 LAB — BASIC METABOLIC PANEL
Anion gap: 11 (ref 5–15)
BUN: 71 mg/dL — ABNORMAL HIGH (ref 8–23)
CO2: 25 mmol/L (ref 22–32)
Calcium: 9.6 mg/dL (ref 8.9–10.3)
Chloride: 104 mmol/L (ref 98–111)
Creatinine, Ser: 3.51 mg/dL — ABNORMAL HIGH (ref 0.61–1.24)
GFR, Estimated: 18 mL/min — ABNORMAL LOW (ref >60)
Glucose, Bld: 107 mg/dL — ABNORMAL HIGH (ref 70–99)
Potassium: 4.2 mmol/L (ref 3.5–5.1)
Sodium: 140 mmol/L (ref 135–145)

## 2022-05-30 NOTE — Telephone Encounter (Signed)
Angel w/[facility] home [facility] called to report nephrology changed torsemide to '20mg'$  twice bid but pt is gaining weight about 1lb per night. Pt is up 4lbs x 4days. Rn asked if we wanted to make any changes.   Routed to Dr.Sabharwal

## 2022-06-01 ENCOUNTER — Encounter (HOSPITAL_BASED_OUTPATIENT_CLINIC_OR_DEPARTMENT_OTHER): Payer: Medicare HMO | Admitting: Internal Medicine

## 2022-06-01 ENCOUNTER — Telehealth: Payer: Self-pay | Admitting: Internal Medicine

## 2022-06-01 DIAGNOSIS — I87311 Chronic venous hypertension (idiopathic) with ulcer of right lower extremity: Secondary | ICD-10-CM | POA: Diagnosis not present

## 2022-06-01 DIAGNOSIS — Z87891 Personal history of nicotine dependence: Secondary | ICD-10-CM | POA: Diagnosis not present

## 2022-06-01 DIAGNOSIS — L97918 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 with other specified severity: Secondary | ICD-10-CM

## 2022-06-01 DIAGNOSIS — I130 Hypertensive heart and chronic kidney disease with heart failure and stage 1 through stage 4 chronic kidney disease, or unspecified chronic kidney disease: Secondary | ICD-10-CM | POA: Diagnosis not present

## 2022-06-01 DIAGNOSIS — I5042 Chronic combined systolic (congestive) and diastolic (congestive) heart failure: Secondary | ICD-10-CM

## 2022-06-01 DIAGNOSIS — E1122 Type 2 diabetes mellitus with diabetic chronic kidney disease: Secondary | ICD-10-CM | POA: Diagnosis not present

## 2022-06-01 DIAGNOSIS — E11622 Type 2 diabetes mellitus with other skin ulcer: Secondary | ICD-10-CM

## 2022-06-01 DIAGNOSIS — I25119 Atherosclerotic heart disease of native coronary artery with unspecified angina pectoris: Secondary | ICD-10-CM | POA: Diagnosis not present

## 2022-06-01 DIAGNOSIS — N184 Chronic kidney disease, stage 4 (severe): Secondary | ICD-10-CM | POA: Diagnosis not present

## 2022-06-01 NOTE — Telephone Encounter (Signed)
error 

## 2022-06-02 ENCOUNTER — Telehealth: Payer: Self-pay | Admitting: Cardiology

## 2022-06-02 DIAGNOSIS — I5042 Chronic combined systolic (congestive) and diastolic (congestive) heart failure: Secondary | ICD-10-CM

## 2022-06-02 DIAGNOSIS — Z79899 Other long term (current) drug therapy: Secondary | ICD-10-CM

## 2022-06-02 NOTE — Telephone Encounter (Signed)
Lets increase torsemide to 40 mg twice a day.  Repeat basic metabolic profile in 1 week. Candee Furbish, MD    [place] aware of Dr Marlou Porch' orders to increase Torsemide to 40 mg twice a day and repeat BMP in 1 week.

## 2022-06-02 NOTE — Progress Notes (Signed)
LAVONE, WEISEL ([id number]) [id number] Page 1 of 8 Visit Report for 06/01/2022 Arrival Information Details Patient Name: Date of Service: Corey [name]. 06/01/2022 11:15 A M Medical Record Number: [id number] Patient Account Number: [id number] Date of Birth/Sex: Treating RN: [birth year] (77 y.o. [name] Primary Care Vibha Ferdig: Billey Gosling Other Clinician: Referring Tamarcus Condie: Treating Leaha Cuervo/Extender: Mickle Asper in Treatment: 30 Visit Information History Since Last Visit Added or deleted any medications: No Patient Arrived: Ambulatory Any Corey allergies or adverse reactions: No Arrival Time: 11:33 Had a fall or experienced change in No Accompanied By: wife activities of daily living that may affect Transfer Assistance: None risk of falls: Patient Identification Verified: Yes Signs or symptoms of abuse/neglect since last visito No Secondary Verification Process Completed: Yes Hospitalized since last visit: No Patient Requires Transmission-Based No Implantable device outside of the clinic excluding No Precautions: cellular tissue based products placed in the center Patient Has Alerts: Yes since last visit: Patient Alerts: in clinic R [place] Has Dressing in Place as Prescribed: Yes 11/17/2021 ABIs: both [place] Has Compression in Place as Prescribed: Yes 11/17/2021 TBI: R0.64 L0.58 Pain Present Now: No Electronic Signature(s) Signed: 06/01/2022 6:38:04 PM By: Deon Pilling RN, BSN Entered By: Deon Pilling on 06/01/2022 11:33:20 -------------------------------------------------------------------------------- Compression Therapy Details Patient Name: Date of Service: Corey [name], [name] 06/01/2022 11:15 A M Medical Record Number: [id number] Patient Account Number: [id number] Date of Birth/Sex: Treating RN: 06-Nov-1945 (77 y.o. [name] Primary Care Johnnette Laux: Billey Gosling Other Clinician: Referring Lavaeh Bau: Treating  Nara Paternoster/Extender: [name] in Treatment: 30 Compression Therapy Performed for Wound Assessment: Wound #1 Right,Lateral Lower Leg Performed By: Clinician Deon Pilling, RN Compression Type: Three Layer Post Procedure Diagnosis Same as Pre-procedure Electronic Signature(s) Signed: 06/01/2022 6:38:04 PM By: Deon Pilling RN, BSN Entered By: Deon Pilling on 06/01/2022 11:47:30 Corey [name] ([id number]) [id number] Page 2 of 8 -------------------------------------------------------------------------------- Encounter Discharge Information Details Patient Name: Date of Service: Corey [name]. 06/01/2022 11:15 A M Medical Record Number: [id number] Patient Account Number: [id number] Date of Birth/Sex: Treating RN: 1946-04-24 (77 y.o. [name] Primary Care Nasire Reali: Billey Gosling Other Clinician: Referring Al Bracewell: Treating Simcha Speir/Extender: Mickle Asper in Treatment: 30 Encounter Discharge Information Items Post Procedure Vitals Discharge Condition: Stable Temperature (F): 97.6 Ambulatory Status: Ambulatory Pulse (bpm): 52 Discharge Destination: Home Respiratory Rate (breaths/min): 20 Transportation: Private Auto Blood Pressure (mmHg): 138/82 Accompanied By: wife Schedule Follow-up Appointment: Yes Clinical Summary of Care: Electronic Signature(s) Signed: 06/01/2022 6:38:04 PM By: Deon Pilling RN, BSN Entered By: Deon Pilling on 06/01/2022 11:52:50 -------------------------------------------------------------------------------- Lower Extremity Assessment Details Patient Name: Date of Service: Corey [name]. 06/01/2022 11:15 A M Medical Record Number: [id number] Patient Account Number: [id number] Date of Birth/Sex: Treating RN: [birth year] (77 y.o. [name] Primary Care Akio Hudnall: Billey Gosling Other Clinician: Referring Haydon Kalmar: Treating Dayle Sherpa/Extender: [name] in Treatment: 30 Edema Assessment Assessed: [Left: No] [Right: Yes] Edema: [Left: Ye] [Right: s] Calf Left: Right: Point of Measurement: 33 cm From Medial Instep 38.5 cm Ankle Left: Right: Point of Measurement: 11 cm From Medial Instep 24 cm Vascular Assessment Pulses: Dorsalis Pedis Palpable: [Right:Yes] Electronic Signature(s) Signed: 06/01/2022 6:38:04 PM By: Deon Pilling RN, BSN Entered By: Deon Pilling on 06/01/2022 11:36:25 Multi Wound Chart Details -------------------------------------------------------------------------------- Corey [name] ([id number]) [id number] Page 3 of 8 Patient Name: Date of Service: Corey [name], Corey [place]. 06/01/2022 11:15  A M Medical Record Number: [id number] Patient Account Number: [id number] Date of Birth/Sex: Treating RN: 05-24-1945 (77 y.o. M) Primary Care Shaylon Aden: Billey Gosling Other Clinician: Referring Cheray Pardi: Treating Kymiah Araiza/Extender: Mickle Asper in Treatment: 30 Vital Signs Height(in): 66 Pulse(bpm): 52 Weight(lbs): 145 Blood Pressure(mmHg): 138/82 Body Mass Index(BMI): 23.4 Temperature(F): 97.6 Respiratory Rate(breaths/min): 20 Wound Assessments Wound Number: 1 N/A N/A Photos: N/A N/A Right, Lateral Lower Leg N/A N/A Wound Location: Gradually Appeared N/A N/A Wounding Event: Diabetic Wound/Ulcer of the Lower N/A N/A Primary Etiology: Extremity Cataracts, Sleep Apnea, Congestive N/A N/A Comorbid History: Heart Failure, Hypertension, Myocardial Infarction, Type II Diabetes, Gout 08/15/2021 N/A N/A Date Acquired: 30 N/A N/A Weeks of Treatment: Open N/A N/A Wound Status: No N/A N/A Wound Recurrence: 0.9x0.4x0.1 N/A N/A Measurements L x W x D (cm) 0.283 N/A N/A A (cm) : rea 0.028 N/A N/A Volume (cm) : 98.70% N/A N/A % Reduction in A rea: 98.70% N/A N/A % Reduction in Volume: Grade 2 N/A N/A Classification: Medium N/A N/A Exudate A  mount: Serosanguineous N/A N/A Exudate Type: red, brown N/A N/A Exudate Color: Distinct, outline attached N/A N/A Wound Margin: Large (67-100%) N/A N/A Granulation A mount: Red, Pink N/A N/A Granulation Quality: Small (1-33%) N/A N/A Necrotic A mount: Fat Layer (Subcutaneous Tissue): Yes N/A N/A Exposed Structures: Fascia: No Tendon: No Muscle: No Joint: No Bone: No Large (67-100%) N/A N/A Epithelialization: Chemical/Enzymatic/Mechanical N/A N/A Debridement: N/A N/A N/A Instrument: None N/A N/A Bleeding: Debridement Treatment Response: Procedure was tolerated well N/A N/A Post Debridement Measurements L x 0.9x0.4x0.1 N/A N/A W x D (cm) 0.028 N/A N/A Post Debridement Volume: (cm) Scarring: Yes N/A N/A Periwound Skin Texture: Excoriation: No Induration: No Callus: No Crepitus: No Rash: No Maceration: No N/A N/A Periwound Skin Moisture: Dry/Scaly: No Atrophie Blanche: No N/A N/A Periwound Skin Color: Cyanosis: No Ecchymosis: No Erythema: No Hemosiderin Staining: No Mottled: No Pallor: No Rubor: No No Abnormality N/A N/A Temperature: Yes N/A N/A Tenderness on Palpation: Corey [name], Corey [name] ([id number]) [id number] Page 4 of 8 Compression Therapy N/A N/A Procedures Performed: Debridement Treatment Notes Wound #1 (Lower Leg) Wound Laterality: Right, Lateral Cleanser Soap and Water Discharge Instruction: May shower and wash wound with dial antibacterial soap and water prior to dressing change. Wound Cleanser Discharge Instruction: Cleanse the wound with wound cleanser prior to applying a clean dressing using gauze sponges, not tissue or cotton balls. Peri-Wound Care Topical Primary Dressing Hydrofera Blue Ready Transfer Foam, 4x5 (in/in) Discharge Instruction: Apply to wound bed as instructed Santyl Ointment Discharge Instruction: Apply under hydrofera blue in clinic. Secondary Dressing ABD Pad, 8x10 Discharge Instruction: Apply  over primary dressing as directed. Woven Gauze Sponge, Non-Sterile 4x4 in Discharge Instruction: Apply over primary dressing as directed. Secured With Compression Wrap ThreePress (3 layer compression wrap) Discharge Instruction: Apply three layer compression as directed. Compression Stockings Add-Ons Electronic Signature(s) Signed: 06/01/2022 12:16:58 PM By: Kalman Shan DO Entered By: Kalman Shan on 06/01/2022 12:10:12 -------------------------------------------------------------------------------- [place] Details Patient Name: Date of Service: Corey [name], [name] 06/01/2022 11:15 A M Medical Record Number: [id number] Patient Account Number: [id number] Date of Birth/Sex: Treating RN: May 25, 1945 (77 y.o. [name] Primary Care Jamieson Lisa: Billey Gosling Other Clinician: Referring Navah Grondin: Treating Bonnye Halle/Extender: Mickle Asper in Treatment: 30 Active Inactive Necrotic Tissue Nursing Diagnoses: Knowledge deficit related to management of necrotic/devitalized tissue Goals: Necrotic/devitalized tissue will be minimized in the wound bed Date Initiated: 11/01/2021 Target Resolution Date: 06/16/2022 Goal Status: Active  Patient/caregiver will verbalize understanding of reason and process for debridement of necrotic tissue Corey [name], Corey [name] ([id number]) [id number] Page 5 of 8 Date Initiated: 11/01/2021 Date Inactivated: 12/05/2021 Target Resolution Date: 12/09/2021 Goal Status: Met Interventions: Assess patient pain level pre-, during and post procedure and prior to discharge Provide education on necrotic tissue and debridement process Treatment Activities: Apply topical anesthetic as ordered : 11/01/2021 Enzymatic debridement : 11/01/2021 Excisional debridement : 11/01/2021 Notes: Pain, Acute or Chronic Nursing Diagnoses: Pain, acute or chronic: actual or potential Potential alteration in comfort,  pain Goals: Patient will verbalize adequate pain control and receive pain control interventions during procedures as needed Date Initiated: 11/01/2021 Date Inactivated: 11/21/2021 Target Resolution Date: 12/09/2021 Goal Status: Met Patient/caregiver will verbalize comfort level met Date Initiated: 11/01/2021 Target Resolution Date: 06/16/2022 Goal Status: Active Interventions: Complete pain assessment as per visit requirements Provide education on pain management Treatment Activities: Administer pain control measures as ordered : 11/01/2021 Notes: Electronic Signature(s) Signed: 06/01/2022 6:38:04 PM By: Deon Pilling RN, BSN Entered By: Deon Pilling on 06/01/2022 11:46:20 -------------------------------------------------------------------------------- Pain Assessment Details Patient Name: Date of Service: Corey [name], [name] 06/01/2022 11:15 A M Medical Record Number: [id number] Patient Account Number: [id number] Date of Birth/Sex: Treating RN: [birth year] (77 y.o. [name] Primary Care Martasia Talamante: Billey Gosling Other Clinician: Referring Rhegan Trunnell: Treating Devynn Scheff/Extender: Mickle Asper in Treatment: 30 Active Problems Location of Pain Severity and Description of Pain Patient Has Paino No Site Locations Rate the pain. Corey [name], Corey [name] ([id number]) [id number] Page 6 of 8 Rate the pain. Current Pain Level: 0 Pain Management and Medication Current Pain Management: Medication: No Cold Application: No Rest: No Massage: No Activity: No T.E.N.S.: No Heat Application: No Leg drop or elevation: No Is the Current Pain Management Adequate: Adequate How does your wound impact your activities of daily livingo Sleep: No Bathing: No Appetite: No Relationship With Others: No Bladder Continence: No Emotions: No Bowel Continence: No Work: No Toileting: No Drive: No Dressing: No Hobbies: No Engineer, maintenance) Signed:  06/01/2022 6:38:04 PM By: Deon Pilling RN, BSN Entered By: Deon Pilling on 06/01/2022 11:33:53 -------------------------------------------------------------------------------- Patient/Caregiver Education Details Patient Name: Date of Service: Corey [name] 1/18/2024andnbsp11:15 A M Medical Record Number: [id number] Patient Account Number: [id number] Date of Birth/Gender: Treating RN: [birth year] (76 y.o. [name] Primary Care Physician: Billey Gosling Other Clinician: Referring Physician: Treating Physician/Extender: Mickle Asper in Treatment: 30 Education Assessment Education Provided To: Patient and Caregiver Education Topics Provided Wound/Skin Impairment: Handouts: Caring for Your Ulcer Methods: Explain/Verbal Responses: Reinforcements needed Electronic Signature(s) Signed: 06/01/2022 6:38:04 PM By: Deon Pilling RN, BSN Entered By: Deon Pilling on 06/01/2022 11:46:31 Corey [name] ([id number]) [id number] Page 7 of 8 -------------------------------------------------------------------------------- Wound Assessment Details Patient Name: Date of Service: Corey [name]. 06/01/2022 11:15 A M Medical Record Number: [id number] Patient Account Number: [id number] Date of Birth/Sex: Treating RN: 12/14/1945 (77 y.o. [name] Primary Care Momodou Consiglio: Billey Gosling Other Clinician: Referring Stana Bayon: Treating Dawaun Brancato/Extender: [name] in Treatment: 30 Wound Status Wound Number: 1 Primary Diabetic Wound/Ulcer of the Lower Extremity Etiology: Wound Location: Right, Lateral Lower Leg Wound Open Wounding Event: Gradually Appeared Status: Date Acquired: 08/15/2021 Comorbid Cataracts, Sleep Apnea, Congestive Heart Failure, Hypertension, Weeks Of Treatment: 30 History: Myocardial Infarction, Type II Diabetes, Gout Clustered Wound: No Photos Wound Measurements Length: (cm) 0 Width: (cm)  0 Depth: (cm) 0 Area: (cm) Volume: (cm) .9 % Reduction in  Area: 98.7% .4 % Reduction in Volume: 98.7% .1 Epithelialization: Large (67-100%) 0.283 Tunneling: No 0.028 Undermining: No Wound Description Classification: Grade 2 Wound Margin: Distinct, outline attached Exudate Amount: Medium Exudate Type: Serosanguineous Exudate Color: red, brown Foul Odor After Cleansing: No Slough/Fibrino Yes Wound Bed Granulation Amount: Large (67-100%) Exposed Structure Granulation Quality: Red, Pink Fascia Exposed: No Necrotic Amount: Small (1-33%) Fat Layer (Subcutaneous Tissue) Exposed: Yes Necrotic Quality: Adherent Slough Tendon Exposed: No Muscle Exposed: No Joint Exposed: No Bone Exposed: No Periwound Skin Texture Texture Color No Abnormalities Noted: No No Abnormalities Noted: No Callus: No Atrophie Blanche: No Crepitus: No Cyanosis: No Excoriation: No Ecchymosis: No Induration: No Erythema: No Rash: No Hemosiderin Staining: No Scarring: Yes Mottled: No Pallor: No Moisture Rubor: No No Abnormalities Noted: No Dry / Scaly: No Temperature / Pain Maceration: No Temperature: No Abnormality Corey [name], Corey [name] ([id number]) [id number] Page 8 of 8 Tenderness on Palpation: Yes Treatment Notes Wound #1 (Lower Leg) Wound Laterality: Right, Lateral Cleanser Soap and Water Discharge Instruction: May shower and wash wound with dial antibacterial soap and water prior to dressing change. Wound Cleanser Discharge Instruction: Cleanse the wound with wound cleanser prior to applying a clean dressing using gauze sponges, not tissue or cotton balls. Peri-Wound Care Topical Primary Dressing Hydrofera Blue Ready Transfer Foam, 4x5 (in/in) Discharge Instruction: Apply to wound bed as instructed Santyl Ointment Discharge Instruction: Apply under hydrofera blue in clinic. Secondary Dressing ABD Pad, 8x10 Discharge Instruction: Apply over primary dressing as  directed. Woven Gauze Sponge, Non-Sterile 4x4 in Discharge Instruction: Apply over primary dressing as directed. Secured With Compression Wrap ThreePress (3 layer compression wrap) Discharge Instruction: Apply three layer compression as directed. Compression Stockings Add-Ons Electronic Signature(s) Signed: 06/01/2022 6:38:04 PM By: Deon Pilling RN, BSN Entered By: Deon Pilling on 06/01/2022 11:38:32 -------------------------------------------------------------------------------- Vitals Details Patient Name: Date of Service: Corey [name], [name] 06/01/2022 11:15 A M Medical Record Number: [id number] Patient Account Number: [id number] Date of Birth/Sex: Treating RN: 1945-10-14 (77 y.o. [name] Primary Care Paysley Poplar: Billey Gosling Other Clinician: Referring Ellieana Dolecki: Treating Dwain Huhn/Extender: [name] in Treatment: 30 Vital Signs Time Taken: 11:33 Temperature (F): 97.6 Height (in): 66 Pulse (bpm): 52 Weight (lbs): 145 Respiratory Rate (breaths/min): 20 Body Mass Index (BMI): 23.4 Blood Pressure (mmHg): 138/82 Reference Range: 80 - 120 mg / dl Electronic Signature(s) Signed: 06/01/2022 6:38:04 PM By: Deon Pilling RN, BSN Entered By: Deon Pilling on 06/01/2022 11:33:47

## 2022-06-02 NOTE — Progress Notes (Signed)
FISHER, HARGADON ([id number]) [id number] Page 1 of 10 Visit Report for 06/01/2022 Chief Complaint Document Details Patient Name: Date of Service: Corey [name]. 06/01/2022 11:15 A M Medical Record Number: [id number] Patient Account Number: [id number] Date of Birth/Sex: Treating RN: 1945-06-15 (77 y.o. M) Primary Care Provider: Billey Gosling Other Clinician: Referring Provider: Treating Provider/Extender: Mickle Asper in Treatment: 30 Information Obtained from: Patient Chief Complaint 11/01/2021; right lower extremity wound Electronic Signature(s) Signed: 06/01/2022 12:16:58 PM By: Kalman Shan DO Entered By: Kalman Shan on 06/01/2022 12:10:18 -------------------------------------------------------------------------------- Debridement Details Patient Name: Date of Service: [name], [name] 06/01/2022 11:15 A M Medical Record Number: [id number] Patient Account Number: [id number] Date of Birth/Sex: Treating RN: 1946-01-18 (77 y.o. [name] Primary Care Provider: Billey Gosling Other Clinician: Referring Provider: Treating Provider/Extender: Mickle Asper in Treatment: 30 Debridement Performed for Assessment: Wound #1 Right,Lateral Lower Leg Performed By: Clinician Deon Pilling, RN Debridement Type: Chemical/Enzymatic/Mechanical Agent Used: Santyl Severity of Tissue Pre Debridement: Fat layer exposed Level of Consciousness (Pre-procedure): Awake and Alert Pre-procedure Verification/Time Out No Taken: Bleeding: None Response to Treatment: Procedure was tolerated well Level of Consciousness (Post- Awake and Alert procedure): Post Debridement Measurements of Total Wound Length: (cm) 0.9 Width: (cm) 0.4 Depth: (cm) 0.1 Volume: (cm) 0.028 Character of Wound/Ulcer Post Debridement: Requires Further Debridement Severity of Tissue Post Debridement: Fat layer exposed Post Procedure Diagnosis Same as  Pre-procedure Electronic Signature(s) Signed: 06/01/2022 12:16:58 PM By: Kalman Shan DO Signed: 06/01/2022 6:38:04 PM By: Deon Pilling RN, BSN Entered By: Deon Pilling on 06/01/2022 11:47:15 [name] ([id number]) [id number] Page 2 of 10 -------------------------------------------------------------------------------- HPI Details Patient Name: Date of Service: Corey [name]. 06/01/2022 11:15 A M Medical Record Number: [id number] Patient Account Number: [id number] Date of Birth/Sex: Treating RN: [birth year] (77 y.o. M) Primary Care Provider: Billey Gosling Other Clinician: Referring Provider: Treating Provider/Extender: Mickle Asper in Treatment: 30 History of Present Illness HPI Description: Admission 11/01/2021 [name] Is a 77 year old male with a past medical history of of type 2 diabetes, coronary artery disease, stage IV kidney disease acute on chronic combined systolic and diastolic heart failure that presents to the clinic for a 55-monthhistory of right lower extremity wound. He states he is not sure how this started. It is completely eschared. He reports his wound has been stable for the past month. He has been keeping the area covered. He has mild chronic pain. He denies signs of infection. He states he has a history of lower extremity ulcers last being on the left leg. He could not tell me how this was treated in the past. 6/27; patient presents for follow-up. He is scheduled for his lower extremity arterial studies on July 6th. He has been using Medihoney to the wound bed. He has no issues or complaints today. He denies signs of infection. 7/6; patient presents for follow-up. He has home health that reported the wound having odor and increased pain last week. He was sent to urgent care. He was started on doxycycline. He is continuing Medihoney. He has his scheduled ABIs w/TBIs today. 7/10; patient presents for  follow-up. He had his arterial studies done last week. He had triphasic waveforms throughout the right lower extremity. His TBI was 0.64. He has good pedal pulses. He is almost done with his course of antibiotics. He has been using mupirocin ointment to the wound bed. 7/17; patient presents for follow-up.  He has been using Iodosorb under compression therapy. He has no issues or complaints today. He states he is receiving Keystone antibiotic tomorrow In the mail. 7/24; patient presents for follow-up. He has been doing Dakin's wet-to-dry dressings to the wound bed daily without issues. He currently denies signs of infection. He reports completing his course of doxycycline. He has his Keystone antibiotic with him today. 7/31; patient presents for follow-up. We have been using Iodoflex with [facility] under compression therapy. He has no issues or complaints today. He denies signs of infection. 8/7; patient presents for follow-up. We have been using Iodoflex with [facility] under compression therapy. Home health came out once last week to change the wrap. He has no issues or complaints today. He denies signs of infection. 8/14; patient presents for follow-up. We have been using Keystone with Hydrofera Blue under compression therapy. He has tolerated this well. He has no issues or complaints today. 8/21; patient presents for follow-up. We have been using Keystone with Hydrofera Blue under compression therapy. The Hydrofera Blue was stuck on tightly to the wound bed today. Other than that he has no issues or complaints. We have not heard back for insurance approval for skin sub. 8/28; patient presents for follow-up. We ordered blast X and collagen powder at last clinic visit. It is unclear if he has received this. We placed Keystone with Fibracol under 3 layer compression last week. Patient has home health. Patient has no issues or complaints today. He was not approved for TheraSkin. 9/5; patient presents for  follow-up. Insurance would not cover blast X. We do have a sample and he would like to try this. We have been using Keystone antibiotic and collagen under 3 layer compression. He has no issues or complaints today. 9/18; patient presents for follow-up. We have been using collagen with blast X and Keystone under compression therapy. He has no issues or complaints today. 9/25; patient presents for follow-up. We have continued to use collagen, blast X and Keystone under compression therapy. Patient has no issues or complaints today. 10/6; patient presents for follow-up. We have been using collagen, blast X and Keystone under compression therapy. He denies signs of infection and has no issues or complaints today. Patient has home health that comes out on Wednesdays and Fridays. 10/16; patient presents for follow-up. He has been using collagen, blast X and Keystone with benefit to the wound bed under compression therapy. He is about to run out of [facility] antibiotic ointment. He has no issues or complaints today. 10/23; patient presents for follow-up. We continue to use collagen, blast X and Keystone under compression therapy. Continued report in wound healing. Home health continues to come out to do dressing changes. Patient has no issues or complaints today. 10/30; patient presents for follow-up. We have been using collagen, blast X and Keystone antibiotic under compression therapy. His wound continues to show improvement in healing. He has no issues or complaints today. 11/30; patient was last seen 1 month ago. He has missed his last clinic visit. He has home health who changes the dressings 3 times weekly. He has been using collagen with Keystone antibiotic under 3 layer compression. He has run out of [place] antibiotic ointment. The wound is almost healed. 12/18; patient was last seen 3 weeks ago. We have been using collagen with Keystone antibiotic under 3 layer compression. Home health has been  coming out twice a week. He has no issues or complaints today. 1/4; patient presents for follow-up. We have been  using collagen under 3 layer compression to the right lower extremity. Home health has been coming out twice weekly to change the dressing. He states the wrap slid down this past week. MANUEL, DALL ([id number]) [id number] Page 3 of 10 1/18; patient presents for follow-up. We have been using Hydrofera Blue under 3 layer compression to the right lower extremity. Home health is discharging the patient. He has no issues or complaints today. Electronic Signature(s) Signed: 06/01/2022 12:16:58 PM By: Kalman Shan DO Entered By: Kalman Shan on 06/01/2022 12:11:11 -------------------------------------------------------------------------------- Physical Exam Details Patient Name: Date of Service: [name], [name] 06/01/2022 11:15 A M Medical Record Number: [id number] Patient Account Number: [id number] Date of Birth/Sex: Treating RN: 26-Nov-1945 (77 y.o. M) Primary Care Provider: Billey Gosling Other Clinician: Referring Provider: Treating Provider/Extender: Mickle Asper in Treatment: 30 Constitutional respirations regular, non-labored and within target range for patient.. Cardiovascular 2+ dorsalis pedis/posterior tibialis pulses. Psychiatric pleasant and cooperative. Notes Right lower extremity: T the anterior aspect there is an open wound with granulation tissue. No surrounding signs of infection. 2+ pitting edema to the knee. o Electronic Signature(s) Signed: 06/01/2022 12:16:58 PM By: Kalman Shan DO Entered By: Kalman Shan on 06/01/2022 12:11:39 -------------------------------------------------------------------------------- Physician Orders Details Patient Name: Date of Service: [name], [name] 06/01/2022 11:15 A M Medical Record Number: [id number] Patient Account Number: [id number] Date of Birth/Sex:  Treating RN: [birth year] (77 y.o. [name] Primary Care Provider: Billey Gosling Other Clinician: Referring Provider: Treating Provider/Extender: Mickle Asper in Treatment: 30 Verbal / Phone Orders: No Diagnosis Coding ICD-10 Coding Code Description G18.299 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 with other specified severity E11.622 Type 2 diabetes mellitus with other skin ulcer I50.42 Chronic combined systolic (congestive) and diastolic (congestive) heart failure I25.119 Atherosclerotic heart disease of native coronary artery with unspecified angina pectoris I87.311 Chronic venous hypertension (idiopathic) with ulcer of right lower extremity Follow-up Appointments ppointment in 1 week. - Dr. Heber [place] and [facility] 06/09/2022 Friday 1015 Return A Other: - Speak with cardiology about fluid build up noted in legs. WALT, GEATHERS ([id number]) [id number] Page 4 of 10 Anesthetic (In clinic) Topical Lidocaine 5% applied to wound bed (In clinic) Topical Lidocaine 4% applied to wound bed Cellular or Tissue Based Products Cellular or Tissue Based Product Type: - run insurance for [employer], epicord, and organogenesis advance tissue product. Theraskin not approved. Epicord not covered. [employer] Apligraf approved. Edema Control - Lymphedema / SCD / Other Avoid standing for long periods of time. Patient to wear own compression stockings every day. - We will apply a tubi grip size E double folded today [facility] for wound care. - [facility] [place] [facility] Wound #1 - Lower Leg Wound Laterality: Right, Lateral Cleanser: Soap and Water (Home Health) 1 x Per Week/30 Days Discharge Instructions: May shower and wash wound with dial antibacterial soap and water prior to dressing change. Cleanser: Wound Cleanser (Home Health) 1 x Per Week/30 Days Discharge Instructions: Cleanse the  wound with wound cleanser prior to applying a clean dressing using gauze sponges, not tissue or cotton balls. Prim Dressing: Hydrofera Blue Ready Transfer Foam, 4x5 (in/in) (Home Health) 1 x Per Week/30 Days ary Discharge Instructions: Apply to wound bed as instructed Prim Dressing: Santyl Ointment 1 x Per Week/30 Days ary Discharge Instructions: Apply under hydrofera blue in clinic. Secondary Dressing: ABD Pad, 8x10 1 x Per Week/30 Days Discharge Instructions: Apply over  primary dressing as directed. Secondary Dressing: Woven Gauze Sponge, Non-Sterile 4x4 in (Home Health) 1 x Per Week/30 Days Discharge Instructions: Apply over primary dressing as directed. Compression Wrap: ThreePress (3 layer compression wrap) (Home Health) 1 x Per Week/30 Days Discharge Instructions: Apply three layer compression as directed. Electronic Signature(s) Signed: 06/01/2022 12:16:58 PM By: Kalman Shan DO Entered By: Kalman Shan on 06/01/2022 12:11:47 -------------------------------------------------------------------------------- Problem List Details Patient Name: Date of Service: [name], [name] 06/01/2022 11:15 A M Medical Record Number: [id number] Patient Account Number: [id number] Date of Birth/Sex: Treating RN: 04-03-1946 (77 y.o. [name] Primary Care Provider: Billey Gosling Other Clinician: Referring Provider: Treating Provider/Extender: Mickle Asper in Treatment: 30 Active Problems ICD-10 Encounter Code Description Active Date MDM Diagnosis L97.918 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 with other 11/01/2021 No Yes specified severity E11.622 Type 2 diabetes mellitus with other skin ulcer 11/01/2021 No Yes DOYEL, MULKERN ([id number]) [id number] Page 5 of 10 I50.42 Chronic combined systolic (congestive) and diastolic (congestive) heart failure 11/01/2021 No Yes I25.119 Atherosclerotic heart disease of  native coronary artery with unspecified angina 11/01/2021 No Yes pectoris I87.311 Chronic venous hypertension (idiopathic) with ulcer of right lower extremity 12/26/2021 No Yes Inactive Problems Resolved Problems Electronic Signature(s) Signed: 06/01/2022 12:16:58 PM By: Kalman Shan DO Entered By: Kalman Shan on 06/01/2022 12:10:06 -------------------------------------------------------------------------------- Progress Note Details Patient Name: Date of Service: [name], [name] 06/01/2022 11:15 A M Medical Record Number: [id number] Patient Account Number: [id number] Date of Birth/Sex: Treating RN: [birth year] (77 y.o. M) Primary Care Provider: Billey Gosling Other Clinician: Referring Provider: Treating Provider/Extender: Mickle Asper in Treatment: 30 Subjective Chief Complaint Information obtained from Patient 11/01/2021; right lower extremity wound History of Present Illness (HPI) Admission 11/01/2021 [name] Is a 77 year old male with a past medical history of of type 2 diabetes, coronary artery disease, stage IV kidney disease acute on chronic combined systolic and diastolic heart failure that presents to the clinic for a 44-monthhistory of right lower extremity wound. He states he is not sure how this started. It is completely eschared. He reports his wound has been stable for the past month. He has been keeping the area covered. He has mild chronic pain. He denies signs of infection. He states he has a history of lower extremity ulcers last being on the left leg. He could not tell me how this was treated in the past. 6/27; patient presents for follow-up. He is scheduled for his lower extremity arterial studies on July 6th. He has been using Medihoney to the wound bed. He has no issues or complaints today. He denies signs of infection. 7/6; patient presents for follow-up. He has home health that reported the wound having odor and increased pain  last week. He was sent to urgent care. He was started on doxycycline. He is continuing Medihoney. He has his scheduled ABIs w/TBIs today. 7/10; patient presents for follow-up. He had his arterial studies done last week. He had triphasic waveforms throughout the right lower extremity. His TBI was 0.64. He has good pedal pulses. He is almost done with his course of antibiotics. He has been using mupirocin ointment to the wound bed. 7/17; patient presents for follow-up. He has been using Iodosorb under compression therapy. He has no issues or complaints today. He states he is receiving Keystone antibiotic tomorrow In the mail. 7/24; patient presents for follow-up. He has been doing Dakin's wet-to-dry dressings  to the wound bed daily without issues. He currently denies signs of infection. He reports completing his course of doxycycline. He has his Keystone antibiotic with him today. 7/31; patient presents for follow-up. We have been using Iodoflex with [facility] under compression therapy. He has no issues or complaints today. He denies signs of infection. 8/7; patient presents for follow-up. We have been using Iodoflex with [facility] under compression therapy. Home health came out once last week to change the wrap. He has no issues or complaints today. He denies signs of infection. 8/14; patient presents for follow-up. We have been using Keystone with Hydrofera Blue under compression therapy. He has tolerated this well. He has no issues or complaints today. 8/21; patient presents for follow-up. We have been using Keystone with Hydrofera Blue under compression therapy. The Hydrofera Blue was stuck on tightly to the wound bed today. Other than that he has no issues or complaints. We have not heard back for insurance approval for skin sub. SAFWAN, TOMEI ([id number]) [id number] Page 6 of 10 8/28; patient presents for follow-up. We ordered blast X and collagen powder at last clinic  visit. It is unclear if he has received this. We placed Keystone with Fibracol under 3 layer compression last week. Patient has home health. Patient has no issues or complaints today. He was not approved for TheraSkin. 9/5; patient presents for follow-up. Insurance would not cover blast X. We do have a sample and he would like to try this. We have been using Keystone antibiotic and collagen under 3 layer compression. He has no issues or complaints today. 9/18; patient presents for follow-up. We have been using collagen with blast X and Keystone under compression therapy. He has no issues or complaints today. 9/25; patient presents for follow-up. We have continued to use collagen, blast X and Keystone under compression therapy. Patient has no issues or complaints today. 10/6; patient presents for follow-up. We have been using collagen, blast X and Keystone under compression therapy. He denies signs of infection and has no issues or complaints today. Patient has home health that comes out on Wednesdays and Fridays. 10/16; patient presents for follow-up. He has been using collagen, blast X and Keystone with benefit to the wound bed under compression therapy. He is about to run out of [facility] antibiotic ointment. He has no issues or complaints today. 10/23; patient presents for follow-up. We continue to use collagen, blast X and Keystone under compression therapy. Continued report in wound healing. Home health continues to come out to do dressing changes. Patient has no issues or complaints today. 10/30; patient presents for follow-up. We have been using collagen, blast X and Keystone antibiotic under compression therapy. His wound continues to show improvement in healing. He has no issues or complaints today. 11/30; patient was last seen 1 month ago. He has missed his last clinic visit. He has home health who changes the dressings 3 times weekly. He has been using collagen with Keystone antibiotic  under 3 layer compression. He has run out of [place] antibiotic ointment. The wound is almost healed. 12/18; patient was last seen 3 weeks ago. We have been using collagen with Keystone antibiotic under 3 layer compression. Home health has been coming out twice a week. He has no issues or complaints today. 1/4; patient presents for follow-up. We have been using collagen under 3 layer compression to the right lower extremity. Home health has been coming out twice weekly to change the dressing. He states the wrap slid down  this past week. 1/18; patient presents for follow-up. We have been using Hydrofera Blue under 3 layer compression to the right lower extremity. Home health is discharging the patient. He has no issues or complaints today. Patient History Family History Cancer - Father, Diabetes - Mother,Father, Heart Disease - Mother,Siblings, Hypertension - Mother,Father. Social History Former smoker - quit 1970, Marital Status - Married, Drug Use - No History, Caffeine Use - Moderate - coffee. Medical History Eyes Patient has history of Cataracts Respiratory Patient has history of Sleep Apnea - has CPAP does not wear Cardiovascular Patient has history of Congestive Heart Failure, Hypertension, Myocardial Infarction - 2014 Endocrine Patient has history of Type II Diabetes Musculoskeletal Patient has history of Gout Neurologic Denies history of Seizure Disorder Hospitalization/Surgery History - 06/2021 inguinal hernia repair. - cardioversion 09/2020. - ventral hernia repair 3/19. 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 Vitals Time Taken: 11:33 AM, Height: 66 in, Weight: 145 lbs, BMI: 23.4, Temperature: 97.6 F, Pulse: 52  bpm, Respiratory Rate: 20 breaths/min, Blood Pressure: 138/82 mmHg. Cardiovascular CHRISTOPHERJOHN, SCHIELE ([id number]) [id number] Page 7 of 10 2+ dorsalis pedis/posterior tibialis pulses. Psychiatric pleasant and cooperative. General Notes: Right lower extremity: T the anterior aspect there is an open wound with granulation tissue. No surrounding signs of infection. 2+ pitting edema o to the knee. Integumentary (Hair, Skin) Wound #1 status is Open. Original cause of wound was Gradually Appeared. The date acquired was: 08/15/2021. The wound has been in treatment 30 weeks. The wound is located on the Right,Lateral Lower Leg. The wound measures 0.9cm length x 0.4cm width x 0.1cm depth; 0.283cm^2 area and 0.028cm^3 volume. There is Fat Layer (Subcutaneous Tissue) exposed. There is no tunneling or undermining noted. There is a medium amount of serosanguineous drainage noted. The wound margin is distinct with the outline attached to the wound base. There is large (67-100%) red, pink granulation within the wound bed. There is a small (1-33%) amount of necrotic tissue within the wound bed including Adherent Slough. The periwound skin appearance exhibited: Scarring. The periwound skin appearance did not exhibit: Callus, Crepitus, Excoriation, Induration, Rash, Dry/Scaly, Maceration, Atrophie Blanche, Cyanosis, Ecchymosis, Hemosiderin Staining, Mottled, Pallor, Rubor, Erythema. Periwound temperature was noted as No Abnormality. The periwound has tenderness on palpation. 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 Type 2 diabetes mellitus with other skin ulcer Chronic combined systolic (congestive) and diastolic (congestive) heart failure Atherosclerotic heart disease of native coronary artery with unspecified angina pectoris Chronic venous hypertension (idiopathic) with ulcer of right lower  extremity Patient's wound has shown improvement in size and appearance since last clinic visit. I noted more bilateral lower extremity edema today. There was weeping clear fluid from the wound bed today. In the past this has not been the case. He is not on dialysis but he just had a fistula placed in anticipation of this. I recommended he discuss his diuretic regimen with his cardiologist As he may need an increase. I recommended continuing the course with Hydrofera Blue under 3 layer compression. Procedures Wound #1 Pre-procedure diagnosis of Wound #1 is a Diabetic Wound/Ulcer of the Lower Extremity located on the Right,Lateral Lower Leg .Severity of Tissue Pre Debridement is: Fat layer exposed. There was a Chemical/Enzymatic/Mechanical debridement performed by Deon Pilling, RN.Marland Kitchen Agent used was [employer].  There was no bleeding. The procedure was tolerated well. Post Debridement Measurements: 0.9cm length x 0.4cm width x 0.1cm depth; 0.028cm^3 volume. Character of Wound/Ulcer Post Debridement requires further debridement. Severity of Tissue Post Debridement is: Fat layer exposed. Post procedure Diagnosis Wound #1: Same as Pre-Procedure Pre-procedure diagnosis of Wound #1 is a Diabetic Wound/Ulcer of the Lower Extremity located on the Right,Lateral Lower Leg . There was a Three Layer Compression Therapy Procedure by Deon Pilling, RN. Post procedure Diagnosis Wound #1: Same as Pre-Procedure Plan Follow-up Appointments: Return Appointment in 1 week. - Dr. Heber Salem and Tammi Klippel 06/09/2022 Friday 1015 Other: - Speak with cardiology about fluid build up noted in legs. Anesthetic: (In clinic) Topical Lidocaine 5% applied to wound bed (In clinic) Topical Lidocaine 4% applied to wound bed Cellular or Tissue Based Products: Cellular or Tissue Based Product Type: - run insurance for [employer], epicord, and [employer] advance tissue product. [employer]askin not approved. Epicord not covered. [employer]  Apligraf approved. Edema Control - Lymphedema / SCD / Other: Avoid standing for long periods of time. Patient to wear own compression stockings every day. - We will apply a tubi grip size E double folded today Home Health: [facility] for wound care. - [place] #1: - Lower Leg Wound Laterality: Right, Lateral Cleanser: Soap and Water (Home Health) 1 x Per Week/30 Days Discharge Instructions: May shower and wash wound with dial antibacterial soap and water prior to dressing change. Cleanser: Wound Cleanser (Home Health) 1 x Per Week/30 Days Discharge Instructions: Cleanse the wound with wound cleanser prior to applying a clean dressing using gauze sponges, not tissue or cotton balls. Prim Dressing: Hydrofera Blue Ready Transfer Foam, 4x5 (in/in) (Home Health) 1 x Per Week/30 Days ary Discharge Instructions: Apply to wound bed as instructed Prim Dressing: Santyl Ointment 1 x Per Week/30 Days ary Discharge Instructions: Apply under hydrofera blue in clinic. Secondary Dressing: ABD Pad, 8x10 1 x Per Week/30 Days DAMAREA, MERKEL ([id number]) [id number] Page 8 of 10 Discharge Instructions: Apply over primary dressing as directed. Secondary Dressing: Woven Gauze Sponge, Non-Sterile 4x4 in (Home Health) 1 x Per Week/30 Days Discharge Instructions: Apply over primary dressing as directed. Compression Wrap: ThreePress (3 layer compression wrap) (Home Health) 1 x Per Week/30 Days Discharge Instructions: Apply three layer compression as directed. 1. Hydrofera Blue under 3 layer compression to the right lower extremity 2. Follow-up in 1 week Electronic Signature(s) Signed: 06/01/2022 12:16:58 PM By: Kalman Shan DO Entered By: Kalman Shan on 06/01/2022 12:15:46 -------------------------------------------------------------------------------- HxROS Details Patient Name: Date of Service: [name], [name] 06/01/2022 11:15 A M Medical  Record Number: [id number] Patient Account Number: [id number] Date of Birth/Sex: Treating RN: 1946/03/24 (77 y.o. M) Primary Care Provider: Billey Gosling Other Clinician: Referring Provider: Treating Provider/Extender: [name] in Treatment: 30 Constitutional Symptoms (General Health) Medical History: Past Medical History Notes: CVA 2014 bilateral hearing loss Eyes Medical History: Positive for: Cataracts Respiratory Medical History: Positive for: Sleep Apnea - has CPAP does not wear Cardiovascular Medical History: Positive for: Congestive Heart Failure; Hypertension; Myocardial Infarction - 2014 Gastrointestinal Medical History: Past Medical History Notes: Diverticulosis Endocrine Medical History: Positive for: Type II Diabetes Time with diabetes: >20 years Treated with: Oral agents, Diet Blood sugar tested every day: Yes Tested : BID Genitourinary Medical History: Past Medical History Notes: Renal insufficiency 20/ Musculoskeletal MALAHKI, GASAWAY ([id number]) [id number] Page 9 of 10 Medical History: Positive for: Gout Neurologic Medical History: Negative for: Seizure Disorder  Oncologic Medical History: Past Medical History Notes: Prostate Ca HBO Extended History Items Eyes: Cataracts Immunizations Pneumococcal Vaccine: Received Pneumococcal Vaccination: No Implantable Devices No devices added Hospitalization / Surgery History Type of Hospitalization/Surgery 06/2021 inguinal hernia repair cardioversion 09/2020 ventral hernia repair 3/19 Family and Social History Cancer: Yes - Father; Diabetes: Yes - Mother,Father; Heart Disease: Yes - Mother,Siblings; Hypertension: Yes - Mother,Father; Former smoker - quit 1970; Marital Status - Married; Drug Use: No History; Caffeine Use: Moderate - coffee; Financial Concerns: No; Food, Clothing or Shelter Needs: No; Support System Lacking: No; Transportation Concerns:  No Electronic Signature(s) Signed: 06/01/2022 12:16:58 PM By: Kalman Shan DO Entered By: Kalman Shan on 06/01/2022 12:11:20 -------------------------------------------------------------------------------- SuperBill Details Patient Name: Date of Service: [name], [name] 06/01/2022 Medical Record Number: [id number] Patient Account Number: [id number] Date of Birth/Sex: Treating RN: 09-01-1945 (77 y.o. 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

## 2022-06-02 NOTE — Telephone Encounter (Signed)
Pt c/o swelling: STAT is pt has developed SOB within 24 hours  How much weight have you gained and in what time span? 182   If swelling, where is the swelling located? Leg in ankle and leg left and left   Are you currently taking a fluid pill? Yes  Are you currently SOB? Yes, has had this issue for a few weeks  Do you have a log of your daily weights (if so, list)?   Have you gained 3 pounds in a day or 5 pounds in a week?   Have you traveled recently? No    [facility] is calling in regards to 182 weight since patient is taking 2 fluid pills per day. Also wanting to know what weight parameter to follow. Please advise.

## 2022-06-09 ENCOUNTER — Ambulatory Visit: Payer: Medicare HMO

## 2022-06-09 ENCOUNTER — Encounter (HOSPITAL_BASED_OUTPATIENT_CLINIC_OR_DEPARTMENT_OTHER): Payer: Medicare HMO | Admitting: Internal Medicine

## 2022-06-09 ENCOUNTER — Ambulatory Visit: Payer: Medicare HMO | Attending: Cardiology

## 2022-06-09 DIAGNOSIS — Z79899 Other long term (current) drug therapy: Secondary | ICD-10-CM | POA: Diagnosis not present

## 2022-06-09 DIAGNOSIS — N184 Chronic kidney disease, stage 4 (severe): Secondary | ICD-10-CM | POA: Diagnosis not present

## 2022-06-09 DIAGNOSIS — E11622 Type 2 diabetes mellitus with other skin ulcer: Secondary | ICD-10-CM | POA: Diagnosis not present

## 2022-06-09 DIAGNOSIS — I87311 Chronic venous hypertension (idiopathic) with ulcer of right lower extremity: Secondary | ICD-10-CM | POA: Diagnosis not present

## 2022-06-09 DIAGNOSIS — I5042 Chronic combined systolic (congestive) and diastolic (congestive) heart failure: Secondary | ICD-10-CM | POA: Diagnosis not present

## 2022-06-09 DIAGNOSIS — L97918 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 with other specified severity: Secondary | ICD-10-CM

## 2022-06-09 DIAGNOSIS — I130 Hypertensive heart and chronic kidney disease with heart failure and stage 1 through stage 4 chronic kidney disease, or unspecified chronic kidney disease: Secondary | ICD-10-CM | POA: Diagnosis not present

## 2022-06-09 DIAGNOSIS — E1122 Type 2 diabetes mellitus with diabetic chronic kidney disease: Secondary | ICD-10-CM | POA: Diagnosis not present

## 2022-06-09 DIAGNOSIS — I25119 Atherosclerotic heart disease of native coronary artery with unspecified angina pectoris: Secondary | ICD-10-CM | POA: Diagnosis not present

## 2022-06-09 DIAGNOSIS — Z87891 Personal history of nicotine dependence: Secondary | ICD-10-CM | POA: Diagnosis not present

## 2022-06-09 NOTE — Progress Notes (Signed)
SOMNANG, MAHAN ([id number]) [id number] Page 1 of 9 Visit Report for 06/09/2022 Chief Complaint Document Details Patient Name: Date of Service: Corey [name]. 06/09/2022 10:15 A [name] Medical Record Number: [id number] Patient Account Number: [id number] Date of Birth/Sex: Treating RN: January 22, 1946 (77 y.o. [name]) Primary Care Provider: Billey Gosling Other Clinician: Referring Provider: Treating Provider/Extender: Mickle Asper in Treatment: 31 Information Obtained from: Patient Chief Complaint 11/01/2021; right lower extremity wound Electronic Signature(s) Signed: 06/09/2022 11:11:08 AM By: Kalman Shan DO Entered By: Kalman Shan on 06/09/2022 11:06:03 -------------------------------------------------------------------------------- HPI Details Patient Name: Date of Service: Corey [name], Corey [name]. 06/09/2022 10:15 A [name] Medical Record Number: [id number] Patient Account Number: [id number] Date of Birth/Sex: Treating RN: 29-Nov-1945 (77 y.o. [name]) Primary Care Provider: Billey Gosling Other Clinician: Referring Provider: Treating Provider/Extender: Mickle Asper in Treatment: 31 History of Present Illness HPI Description: Admission 11/01/2021 Corey [name] Is a 77 year old male with a past medical history of of type 2 diabetes, coronary artery disease, stage IV kidney disease acute on chronic combined systolic and diastolic heart failure that presents to the clinic for a 45-monthhistory of right lower extremity wound. He states he is not sure how this started. It is completely eschared. He reports his wound has been stable for the past month. He has been keeping the area covered. He has mild chronic pain. He denies signs of infection. He states he has a history of lower extremity ulcers last being on the left leg. He could not tell me how this was treated in the past. 6/27; patient presents for follow-up. He is scheduled for his lower  extremity arterial studies on July 6th. He has been using Medihoney to the wound bed. He has no issues or complaints today. He denies signs of infection. 7/6; patient presents for follow-up. He has home health that reported the wound having odor and increased pain last week. He was sent to urgent care. He was started on doxycycline. He is continuing Medihoney. He has his scheduled ABIs w/TBIs today. 7/10; patient presents for follow-up. He had his arterial studies done last week. He had triphasic waveforms throughout the right lower extremity. His TBI was 0.64. He has good pedal pulses. He is almost done with his course of antibiotics. He has been using mupirocin ointment to the wound bed. 7/17; patient presents for follow-up. He has been using Iodosorb under compression therapy. He has no issues or complaints today. He states he is receiving Keystone antibiotic tomorrow In the mail. 7/24; patient presents for follow-up. He has been doing Dakin's wet-to-dry dressings to the wound bed daily without issues. He currently denies signs of infection. He reports completing his course of doxycycline. He has his Keystone antibiotic with him today. 7/31; patient presents for follow-up. We have been using Iodoflex with [place] [place] therapy. He has no issues or complaints today. He denies signs of infection. 8/7; patient presents for follow-up. We have [facility] Home health came out once last week to change the wrap. He has no issues or complaints today. He denies signs of infection. 8/14; patient presents for follow-up. We have been using Keystone with Hydrofera Blue under compression therapy. He has tolerated this well. He has no issues or complaints today. Corey [name], Corey [name]([id number] Page 2 of 9 8/21; patient presents for follow-up. We have been using Keystone with Hydrofera Blue under compression therapy.  The Hydrofera Blue was stuck  on tightly to the wound bed today. Other than that he has no issues or complaints. We have not heard back for insurance approval for skin sub. 8/28; patient presents for follow-up. We ordered blast X and collagen powder at last clinic visit. It is unclear if he has received this. We placed Keystone with Fibracol under 3 layer compression last week. Patient has home health. Patient has no issues or complaints today. He was not approved for TheraSkin. 9/5; patient presents for follow-up. Insurance would not cover blast X. We do have a sample and he would like to try this. We have been using Keystone antibiotic and collagen under 3 layer compression. He has no issues or complaints today. 9/18; patient presents for follow-up. We have been using collagen with blast X and Keystone under compression therapy. He has no issues or complaints today. 9/25; patient presents for follow-up. We have continued to use collagen, blast X and Keystone under compression therapy. Patient has no issues or complaints today. 10/6; patient presents for follow-up. We have been using collagen, blast X and Keystone under compression therapy. He denies signs of infection and has no issues or complaints today. Patient has home health that comes out on Wednesdays and Fridays. 10/16; patient presents for follow-up. He has been using collagen, blast X and Keystone with benefit to the wound bed under compression therapy. He is about to run out of [facility] antibiotic ointment. He has no issues or complaints today. 10/23; patient presents for follow-up. We continue to use collagen, blast X and Keystone under compression therapy. Continued report in wound healing. Home health continues to come out to do dressing changes. Patient has no issues or complaints today. 10/30; patient presents for follow-up. We have been using collagen, blast X and Keystone antibiotic under compression therapy. His wound continues to  show improvement in healing. He has no issues or complaints today. 11/30; patient was last seen 1 month ago. He has missed his last clinic visit. He has home health who changes the dressings 3 times weekly. He has been using collagen with Keystone antibiotic under 3 layer compression. He has run out of [place] antibiotic ointment. The wound is almost healed. 12/18; patient was last seen 3 weeks ago. We have been using collagen with Keystone antibiotic under 3 layer compression. Home health has been coming out twice a week. He has no issues or complaints today. 1/4; patient presents for follow-up. We have been using collagen under 3 layer compression to the right lower extremity. Home health has been coming out twice weekly to change the dressing. He states the wrap slid down this past week. 1/18; patient presents for follow-up. We have been using Hydrofera Blue under 3 layer compression to the right lower extremity. Home health is discharging the patient. He has no issues or complaints today. 1/26; patient presents for follow-up. We have been using Hydrofera Blue under 3 layer compression to the right lower extremity. He states his diuretic was increased. He has no issues or complaints today. Electronic Signature(s) Signed: 06/09/2022 11:11:08 AM By: Kalman Shan DO Entered By: Kalman Shan on 06/09/2022 11:07:46 -------------------------------------------------------------------------------- Physical Exam Details Patient Name: Date of Service: Corey [name], Corey [name]. 06/09/2022 10:15 A [name] Medical Record Number: [id number] Patient Account Number: [id number] Date of Birth/Sex: Treating RN: 02-08-1946 (77 y.o. [name]) Primary Care Provider: Billey Gosling Other Clinician: Referring Provider: Treating Provider/Extender: Mickle Asper in Treatment: 31 Constitutional respirations regular, non-labored and within target range for patient.. Cardiovascular  2+ dorsalis  pedis/posterior tibialis pulses. Psychiatric pleasant and cooperative. Notes Right lower extremity: T the anterior aspect there is an open wound with granulation tissue. No surrounding signs of infection. 2+ pitting edema to the knee. o Electronic Signature(s) Signed: 06/09/2022 11:11:08 AM By: Kalman Shan DO Entered By: Kalman Shan on 06/09/2022 11:08:40 Corey [name] ([id number]) [id number] Page 3 of 9 -------------------------------------------------------------------------------- Physician Orders Details Patient Name: Date of Service: Corey [name]. 06/09/2022 10:15 A [name] Medical Record Number: [id number] Patient Account Number: [id number] Date of Birth/Sex: Treating RN: 1945-08-03 (77 y.o. [name] Primary Care Provider: Billey Gosling Other Clinician: Referring Provider: Treating Provider/Extender: Mickle Asper in Treatment: 22 Verbal / Phone Orders: No Diagnosis Coding Follow-up Appointments ppointment in 1 week. - Dr. Heber [place] Monday 06/19/22 @ 9:30 w/ Allayne Butcher Rm # 9 Return A Other: - Speak with cardiology about fluid build up noted in legs. Anesthetic (In clinic) Topical Lidocaine 5% applied to wound bed (In clinic) Topical Lidocaine 4% applied to wound bed Cellular or Tissue Based Products Cellular or Tissue Based Product Type: - run insurance for [employer], epicord, and organogenesis advance tissue product. Theraskin not approved. Epicord not covered. [employer] Apligraf approved. Edema Control - Lymphedema / SCD / Other Avoid standing for long periods of time. Patient to wear own compression stockings every day. - We will apply a tubi grip size E double folded today [place] home health for wound care. - [place] Wound Treatment Wound #1 - Lower Leg Wound Laterality: Right, Lateral Cleanser: Soap and Water (Home Health) 1 x Per Week/30 Days Discharge Instructions: May  shower and wash wound with dial antibacterial soap and water prior to dressing change. Cleanser: Wound Cleanser (Home Health) 1 x Per Week/30 Days Discharge Instructions: Cleanse the wound with wound cleanser prior to applying a clean dressing using gauze sponges, not tissue or cotton balls. Prim Dressing: Endoform 2x2 in 1 x Per Week/30 Days ary Discharge Instructions: Moisten with saline Secondary Dressing: ABD Pad, 8x10 1 x Per Week/30 Days Discharge Instructions: Apply over primary dressing as directed. Secondary Dressing: Woven Gauze Sponge, Non-Sterile 4x4 in (Home Health) 1 x Per Week/30 Days Discharge Instructions: Apply over primary dressing as directed. Compression Wrap: ThreePress (3 layer compression wrap) (Home Health) 1 x Per Week/30 Days Discharge Instructions: Apply three layer compression as directed. Electronic Signature(s) Signed: 06/09/2022 11:11:08 AM By: Kalman Shan DO Entered By: Kalman Shan on 06/09/2022 11:08:46 -------------------------------------------------------------------------------- Problem List Details Patient Name: Date of Service: Corey [name], Corey [name]. 06/09/2022 10:15 A [name] Medical Record Number: [id number] Patient Account Number: [id number] Corey [name], Corey [name] ([id number]) [id number].pdf Page 4 of 9 Date of Birth/Sex: Treating RN: 10-29-1945 (77 y.o. [name]) Primary Care Provider: Other Clinician: Billey Gosling Referring Provider: Treating Provider/Extender: Mickle Asper in Treatment: 101 Active Problems ICD-10 Encounter Code Description Active Date MDM Diagnosis L97.918 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 with other 11/01/2021 No Yes specified severity E11.622 Type 2 diabetes mellitus with other skin ulcer 11/01/2021 No Yes I50.42 Chronic combined systolic (congestive) and diastolic (congestive) heart failure 11/01/2021 No Yes I25.119 Atherosclerotic heart disease of native  coronary artery with unspecified angina 11/01/2021 No Yes pectoris I87.311 Chronic venous hypertension (idiopathic) with ulcer of right lower extremity 12/26/2021 No Yes Inactive Problems Resolved Problems Electronic Signature(s) Signed: 06/09/2022 11:11:08 AM By: Kalman Shan DO Entered By: Kalman Shan on 06/09/2022 11:05:51 -------------------------------------------------------------------------------- Progress Note Details Patient Name: Date  of Service: Corey [name] 06/09/2022 10:15 A [name] Medical Record Number: [id number] Patient Account Number: [id number] Date of Birth/Sex: Treating RN: 1946/02/07 (77 y.o. [name]) Primary Care Provider: Billey Gosling Other Clinician: Referring Provider: Treating Provider/Extender: Mickle Asper in Treatment: 53 Subjective Chief Complaint Information obtained from Patient 11/01/2021; right lower extremity wound History of Present Illness (HPI) Admission 11/01/2021 Mr. Henrick Mcgue Is a 77 year old male with a past medical history of of type 2 diabetes, coronary artery disease, stage IV kidney disease acute on chronic combined systolic and diastolic heart failure that presents to the clinic for a 82-monthhistory of right lower extremity wound. He states he is not sure how this started. It is completely eschared. He reports his wound has been stable for the past month. He has been keeping the area covered. He has mild chronic pain. He denies signs of infection. He states he has a history of lower extremity ulcers last being on the left leg. He could not tell me how this was treated in the past. 6/27; patient presents for follow-up. He is scheduled for his lower extremity arterial studies on July 6th. He has been using Medihoney to the wound bed. He has no issues or complaints today. He denies signs of infection. 7/6; patient presents for follow-up. He has home health that reported the wound having odor and increased pain last  week. He was sent to urgent care. He was Corey [name], Corey [name]([id number] Page 5 of 9 started on doxycycline. He is continuing Medihoney. He has his scheduled ABIs w/TBIs today. 7/10; patient presents for follow-up. He had his arterial studies done last week. He had triphasic waveforms throughout the right lower extremity. His TBI was 0.64. He has good pedal pulses. He is almost done with his course of antibiotics. He has been using mupirocin ointment to the wound bed. 7/17; patient presents for follow-up. He has been using Iodosorb under compression therapy. He has no issues or complaints today. He states he is receiving Keystone antibiotic tomorrow In the mail. 7/24; patient presents for follow-up. He has been doing Dakin's wet-to-dry dressings to the wound bed daily without issues. He currently denies signs of infection. He reports completing his course of doxycycline. He has his Keystone antibiotic with him today. 7/31; patient presents for follow-up. We have been [facility]lex [facility]. He has no issues or complaints today. He denies signs of infection. 8/7; patient presents for follow-up. We have been using [facility]lex [facility]. Home health came out once last week to change the wrap. He has no issues or complaints today. He denies signs of infection. 8/14; patient presents for follow-up. We have been using Keystone with Hydrofera Blue under compression therapy. He has tolerated this well. He has no issues or complaints today. 8/21; patient presents for follow-up. We have been using Keystone with Hydrofera Blue under compression therapy. The Hydrofera Blue was stuck on tightly to the wound bed today. Other than that he has no issues or complaints. We have not heard back for insurance approval for skin sub. 8/28; patient presents for follow-up. We ordered blast X and collagen powder at last clinic visit.  It is unclear if he has received this. We placed Keystone with Fibracol under 3 layer compression last week. Patient has home health. Patient has no issues or complaints today. He was not approved for TheraSkin. 9/5; patient presents for follow-up. Insurance would not cover blast X. We  do have a sample and he would like to try this. We have been using Keystone antibiotic and collagen under 3 layer compression. He has no issues or complaints today. 9/18; patient presents for follow-up. We have been using collagen with blast X and Keystone under compression therapy. He has no issues or complaints today. 9/25; patient presents for follow-up. We have continued to use collagen, blast X and Keystone under compression therapy. Patient has no issues or complaints today. 10/6; patient presents for follow-up. We have been using collagen, blast X and Keystone under compression therapy. He denies signs of infection and has no issues or complaints today. Patient has home health that comes out on Wednesdays and Fridays. 10/16; patient presents for follow-up. He has been using collagen, blast X and Keystone with benefit to the wound bed under compression therapy. He is about to run out of [facility] antibiotic ointment. He has no issues or complaints today. 10/23; patient presents for follow-up. We continue to use collagen, blast X and Keystone under compression therapy. Continued report in wound healing. Home health continues to come out to do dressing changes. Patient has no issues or complaints today. 10/30; patient presents for follow-up. We have been using collagen, blast X and Keystone antibiotic under compression therapy. His wound continues to show improvement in healing. He has no issues or complaints today. 11/30; patient was last seen 1 month ago. He has missed his last clinic visit. He has home health who changes the dressings 3 times weekly. He has been using collagen with Keystone antibiotic under 3  layer compression. He has run out of [place] antibiotic ointment. The wound is almost healed. 12/18; patient was last seen 3 weeks ago. We have been using collagen with Keystone antibiotic under 3 layer compression. Home health has been coming out twice a week. He has no issues or complaints today. 1/4; patient presents for follow-up. We have been using collagen under 3 layer compression to the right lower extremity. Home health has been coming out twice weekly to change the dressing. He states the wrap slid down this past week. 1/18; patient presents for follow-up. We have been using Hydrofera Blue under 3 layer compression to the right lower extremity. Home health is discharging the patient. He has no issues or complaints today. 1/26; patient presents for follow-up. We have been using Hydrofera Blue under 3 layer compression to the right lower extremity. He states his diuretic was increased. He has no issues or complaints today. Patient History Family History Cancer - Father, Diabetes - Mother,Father, Heart Disease - Mother,Siblings, Hypertension - Mother,Father. Social History Former smoker - quit 1970, Marital Status - Married, Drug Use - No History, Caffeine Use - Moderate - coffee. Medical History Eyes Patient has history of Cataracts Respiratory Patient has history of Sleep Apnea - has CPAP does not wear Cardiovascular Patient has history of Congestive Heart Failure, Hypertension, Myocardial Infarction - 2014 Endocrine Patient has history of Type II Diabetes Musculoskeletal Patient has history of Gout Neurologic Denies history of Seizure Disorder Hospitalization/Surgery History - 06/2021 inguinal hernia repair. - cardioversion 09/2020. - ventral hernia repair 3/19. Medical A Surgical History Notes nd Constitutional Symptoms (General Health) CVA 2014 bilateral hearing loss Gastrointestinal Diverticulosis Genitourinary Corey [name], Corey [name] ([id number])  [id number] Page 6 of 9 Renal insufficiency 20/ Oncologic Prostate Ca Objective Constitutional respirations regular, non-labored and within target range for patient.. Vitals Time Taken: 10:17 AM, Height: 66 in, Weight: 145 lbs, BMI: 23.4, Temperature: 97.7 F, Pulse: 47 bpm, Respiratory Rate:  20 breaths/min, Blood Pressure: 131/80 mmHg. Cardiovascular 2+ dorsalis pedis/posterior tibialis pulses. Psychiatric pleasant and cooperative. General Notes: Right lower extremity: T the anterior aspect there is an open wound with granulation tissue. No surrounding signs of infection. 2+ pitting edema o to the knee. Integumentary (Hair, Skin) Wound #1 status is Open. Original cause of wound was Gradually Appeared. The date acquired was: 08/15/2021. The wound has been in treatment 31 weeks. The wound is located on the Right,Lateral Lower Leg. The wound measures 0.7cm length x 0.4cm width x 0.2cm depth; 0.22cm^2 area and 0.044cm^3 volume. There is Fat Layer (Subcutaneous Tissue) exposed. There is no tunneling or undermining noted. There is a medium amount of serosanguineous drainage noted. The wound margin is distinct with the outline attached to the wound base. There is large (67-100%) red, pink granulation within the wound bed. There is no necrotic tissue within the wound bed. The periwound skin appearance exhibited: Scarring. The periwound skin appearance did not exhibit: Callus, Crepitus, Excoriation, Induration, Rash, Dry/Scaly, Maceration, Atrophie Blanche, Cyanosis, Ecchymosis, Hemosiderin Staining, Mottled, Pallor, Rubor, Erythema. Periwound temperature was noted as No Abnormality. The periwound has tenderness on palpation. 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 Type 2 diabetes mellitus with other skin ulcer Chronic combined systolic (congestive) and diastolic (congestive)  heart failure Atherosclerotic heart disease of native coronary artery with unspecified angina pectoris Chronic venous hypertension (idiopathic) with ulcer of right lower extremity Patient's wound has shown improvement in size and appearance since last clinic visit. At this time I recommended switching the dressing to endoform and continuing 3 layer compression. He was discharged from home health. He will follow-up weekly with [place]. Procedures Wound #1 Pre-procedure diagnosis of Wound #1 is a Diabetic Wound/Ulcer of the Lower Extremity located on the Right,Lateral Lower Leg . There was a Three Layer Compression Therapy Procedure by Rhae Hammock, RN. Post procedure Diagnosis Wound #1: Same as Pre-Procedure Plan Follow-up Appointments: Return Appointment in 1 week. - Dr. Heber [place] Monday 06/19/22 @ 9:30 w/ Allayne Butcher Rm # 9 Other: - Speak with cardiology about fluid build up noted in legs. Anesthetic: (In clinic) Topical Lidocaine 5% applied to wound bed (In clinic) Topical Lidocaine 4% applied to wound bed Cellular or Tissue Based Products: Cellular or Tissue Based Product Type: - run insurance for [employer], epicord, and organogenesis advance tissue product. Theraskin not approved. Epicord not Corey [name], Corey [name] ([id number]) [id number] Page 7 of 9 covered. [employer] Apligraf approved. Edema Control - Lymphedema / SCD / Other: Avoid standing for long periods of time. Patient to wear own compression stockings every day. - We will apply a tubi grip size E double folded today Home Health: [place] home health for wound care. - [place] #1: - Lower Leg Wound Laterality: Right, Lateral Cleanser: Soap and Water (Home Health) 1 x Per Week/30 Days Discharge Instructions: May shower and wash wound with dial antibacterial soap and water prior to dressing change. Cleanser: Wound Cleanser (Home Health) 1 x Per Week/30 Days Discharge Instructions: Cleanse the  wound with wound cleanser prior to applying a clean dressing using gauze sponges, not tissue or cotton balls. Prim Dressing: Endoform 2x2 in 1 x Per Week/30 Days ary Discharge Instructions: Moisten with saline Secondary Dressing: ABD Pad, 8x10 1 x Per Week/30 Days Discharge Instructions: Apply over primary dressing as directed. Secondary Dressing: Woven Gauze Sponge, Non-Sterile 4x4 in (Home Health) 1 x Per Week/30 Days Discharge Instructions: Apply over primary dressing  as directed. Com pression Wrap: ThreePress (3 layer compression wrap) (Home Health) 1 x Per Week/30 Days Discharge Instructions: Apply three layer compression as directed. 1. Endoform under 3 layer compression 2. Follow-up in 1 week Electronic Signature(s) Signed: 06/09/2022 11:11:08 AM By: Kalman Shan DO Entered By: Kalman Shan on 06/09/2022 11:09:37 -------------------------------------------------------------------------------- HxROS Details Patient Name: Date of Service: Corey [name], Corey [name]. 06/09/2022 10:15 A [name] Medical Record Number: [id number] Patient Account Number: [id number] Date of Birth/Sex: Treating RN: [birth year] (77 y.o. [name]) Primary Care Provider: Billey Gosling Other Clinician: Referring Provider: Treating Provider/Extender: [name] in Treatment: 37 Constitutional Symptoms (General Health) Medical History: Past Medical History Notes: CVA 2014 bilateral hearing loss Eyes Medical History: Positive for: Cataracts Respiratory Medical History: Positive for: Sleep Apnea - has CPAP does not wear Cardiovascular Medical History: Positive for: Congestive Heart Failure; Hypertension; Myocardial Infarction - 2014 Gastrointestinal Medical History: Past Medical History Notes: Diverticulosis Endocrine Medical History: Positive for: Type II Diabetes Time with diabetes: >20 years Corey [name], Corey [name] ([id number]) [id number] Page 8 of 9 Treated with: Oral  agents, Diet Blood sugar tested every day: Yes Tested : BID Genitourinary Medical History: Past Medical History Notes: Renal insufficiency 20/ Musculoskeletal Medical History: Positive for: Gout Neurologic Medical History: Negative for: Seizure Disorder Oncologic Medical History: Past Medical History Notes: Prostate Ca HBO Extended History Items Eyes: Cataracts Immunizations Pneumococcal Vaccine: Received Pneumococcal Vaccination: No Implantable Devices No devices added Hospitalization / Surgery History Type of Hospitalization/Surgery 06/2021 inguinal hernia repair cardioversion 09/2020 ventral hernia repair 3/19 Family and Social History Cancer: Yes - Father; Diabetes: Yes - Mother,Father; Heart Disease: Yes - Mother,Siblings; Hypertension: Yes - Mother,Father; Former smoker - quit 1970; Marital Status - Married; Drug Use: No History; Caffeine Use: Moderate - coffee; Financial Concerns: No; Food, Clothing or Shelter Needs: No; Support System Lacking: No; Transportation Concerns: No Electronic Signature(s) Signed: 06/09/2022 11:11:08 AM By: Kalman Shan DO Entered By: Kalman Shan on 06/09/2022 11:08:14 -------------------------------------------------------------------------------- SuperBill Details Patient Name: Date of Service: Corey [name], Corey [name]. 06/09/2022 Medical Record Number: [id number] Patient Account Number: [id number] Date of Birth/Sex: Treating RN: 12-Jan-1946 (77 y.o. [name] Primary Care Provider: Billey Gosling Other Clinician: Referring Provider: Treating Provider/Extender: Mickle Asper in Treatment: 31 Diagnosis Coding ICD-10 Codes Code Description Corey [name], Corey [name] ([id number]) [id number] Page 9 of 9 L97.918 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 with other specified severity E11.622 Type 2 diabetes mellitus with other skin ulcer I50.42 Chronic combined  systolic (congestive) and diastolic (congestive) heart failure I25.119 Atherosclerotic heart disease of native coronary artery with unspecified angina pectoris I87.311 Chronic venous hypertension (idiopathic) with ulcer of right lower extremity Facility Procedures : CPT4 Code: 57322025 Description: (Facility Use Only) 42706CB - APPLY MULTLAY COMPRS LWR RT LEG Modifier: Quantity: 1 Physician Procedures : CPT4 Code Description Modifier [id number] [id number] - WC PHYS LEVEL 3 - EST PT ICD-10 Diagnosis Description L97.918 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

## 2022-06-10 LAB — BASIC METABOLIC PANEL
BUN/Creatinine Ratio: 21 (ref 10–24)
BUN: 63 mg/dL — ABNORMAL HIGH (ref 8–27)
CO2: 23 mmol/L (ref 20–29)
Calcium: 9.5 mg/dL (ref 8.6–10.2)
Chloride: 100 mmol/L (ref 96–106)
Creatinine, Ser: 3.06 mg/dL — ABNORMAL HIGH (ref 0.76–1.27)
Glucose: 153 mg/dL — ABNORMAL HIGH (ref 70–99)
Potassium: 4.1 mmol/L (ref 3.5–5.2)
Sodium: 143 mmol/L (ref 134–144)
eGFR: 21 mL/min/{1.73_m2} — ABNORMAL LOW (ref >59)

## 2022-06-13 ENCOUNTER — Other Ambulatory Visit: Payer: Medicare HMO

## 2022-06-13 DIAGNOSIS — Z515 Encounter for palliative care: Secondary | ICD-10-CM

## 2022-06-14 ENCOUNTER — Ambulatory Visit: Payer: Medicare HMO | Attending: Cardiology

## 2022-06-14 DIAGNOSIS — I5020 Unspecified systolic (congestive) heart failure: Secondary | ICD-10-CM

## 2022-06-14 DIAGNOSIS — I480 Paroxysmal atrial fibrillation: Secondary | ICD-10-CM | POA: Diagnosis not present

## 2022-06-14 LAB — CBC WITH DIFFERENTIAL/PLATELET
Basophils Absolute: 0.1 10*3/uL (ref 0.0–0.2)
Basos: 2 %
EOS (ABSOLUTE): 0.1 10*3/uL (ref 0.0–0.4)
Eos: 4 %
Hematocrit: 35.3 % — ABNORMAL LOW (ref 37.5–51.0)
Hemoglobin: 11.7 g/dL — ABNORMAL LOW (ref 13.0–17.7)
Lymphocytes Absolute: 0.8 10*3/uL (ref 0.7–3.1)
Lymphs: 21 %
MCH: 26.9 pg (ref 26.6–33.0)
MCHC: 33.1 g/dL (ref 31.5–35.7)
MCV: 81 fL (ref 79–97)
Monocytes Absolute: 0.6 10*3/uL (ref 0.1–0.9)
Monocytes: 18 %
Neutrophils Absolute: 2 10*3/uL (ref 1.4–7.0)
Neutrophils: 55 %
Platelets: 129 10*3/uL — ABNORMAL LOW (ref 150–450)
RBC: 4.35 x10E6/uL (ref 4.14–5.80)
RDW: 17.9 % — ABNORMAL HIGH (ref 11.6–15.4)
WBC: 3.7 10*3/uL (ref 3.4–10.8)

## 2022-06-14 LAB — BASIC METABOLIC PANEL
BUN/Creatinine Ratio: 18 (ref 10–24)
BUN: 61 mg/dL — ABNORMAL HIGH (ref 8–27)
CO2: 29 mmol/L (ref 20–29)
Calcium: 9.6 mg/dL (ref 8.6–10.2)
Chloride: 105 mmol/L (ref 96–106)
Creatinine, Ser: 3.32 mg/dL — ABNORMAL HIGH (ref 0.76–1.27)
Glucose: 109 mg/dL — ABNORMAL HIGH (ref 70–99)
Potassium: 4.7 mmol/L (ref 3.5–5.2)
Sodium: 140 mmol/L (ref 134–144)
eGFR: 19 mL/min/{1.73_m2} — ABNORMAL LOW (ref >59)

## 2022-06-14 NOTE — Progress Notes (Signed)
Corey, [name] ([id number]) [id number] Page 1 of 8 Visit Report for 06/09/2022 Arrival Information Details Patient Name: Date of Service: Corey [name]. 06/09/2022 10:15 A M Medical Record Number: [id number] Patient Account Number: [id number] Date of Birth/Sex: Treating RN: [birth year] (77 y.o. Corey [name] Primary Care Reuel Lamadrid: Billey Gosling Other Clinician: Referring Chandlar Staebell: Treating Sanita Estrada/Extender: Mickle Asper in Treatment: 27 Visit Information History Since Last Visit Added or deleted any medications: No Patient Arrived: Ambulatory Any new allergies or adverse reactions: No Arrival Time: 10:17 Had a fall or experienced change in No Accompanied By: wife activities of daily living that may affect Transfer Assistance: None risk of falls: Patient Identification Verified: Yes Signs or symptoms of abuse/neglect since last visito No Secondary Verification Process Completed: Yes Hospitalized since last visit: No Patient Requires Transmission-Based No Implantable device outside of the clinic excluding No Precautions: cellular tissue based products placed in the center Patient Has Alerts: Yes since last visit: Patient Alerts: in clinic R [place] Has Dressing in Place as Prescribed: Yes 11/17/2021 ABIs: both [place] Has Compression in Place as Prescribed: Yes 11/17/2021 TBI: R0.64 L0.58 Pain Present Now: No Electronic Signature(s) Signed: 06/09/2022 4:56:20 PM By: Deon Pilling RN, BSN Entered By: Deon Pilling on 06/09/2022 10:17:45 -------------------------------------------------------------------------------- Compression Therapy Details Patient Name: Date of Service: [name], [name] 06/09/2022 10:15 A M Medical Record Number: [id number] Patient Account Number: [id number] Date of Birth/Sex: Treating RN: [birth year] (77 y.o. [name] Primary Care Katey Barrie: Billey Gosling Other Clinician: Referring Taje Littler: Treating  Sharalee Witman/Extender: Mickle Asper in Treatment: 31 Compression Therapy Performed for Wound Assessment: Wound #1 Right,Lateral Lower Leg Performed By: Clinician Rhae Hammock, RN Compression Type: Three Layer Post Procedure Diagnosis Same as Pre-procedure Electronic Signature(s) Signed: 06/14/2022 8:39:24 AM By: Rhae Hammock RN Entered By: Rhae Hammock on 06/09/2022 11:01:22 [name] ([id number]) [id number] Page 2 of 8 -------------------------------------------------------------------------------- Encounter Discharge Information Details Patient Name: Date of Service: Corey [name]. 06/09/2022 10:15 A M Medical Record Number: [id number] Patient Account Number: [id number] Date of Birth/Sex: Treating RN: 09/29/1945 (77 y.o. [name] Primary Care Hamzah Savoca: Billey Gosling Other Clinician: Referring Doniven Vanpatten: Treating Vela Render/Extender: Mickle Asper in Treatment: 73 Encounter Discharge Information Items Discharge Condition: Stable Ambulatory Status: Ambulatory Discharge Destination: Home Transportation: Private Auto Accompanied By: self Schedule Follow-up Appointment: Yes Clinical Summary of Care: Patient Declined Electronic Signature(s) Signed: 06/14/2022 8:39:24 AM By: Rhae Hammock RN Entered By: Rhae Hammock on 06/09/2022 11:02:19 -------------------------------------------------------------------------------- Lower Extremity Assessment Details Patient Name: Date of Service: [name], [name] 06/09/2022 10:15 A M Medical Record Number: [id number] Patient Account Number: [id number] Date of Birth/Sex: Treating RN: 1945-07-24 (77 y.o. M) Primary Care Desarae Placide: Billey Gosling Other Clinician: Referring Kassandra Meriweather: Treating Azahel Belcastro/Extender: [name] in Treatment: 31 Edema Assessment Assessed: [Left: No] [Right: No] Edema: [Left: Ye] [Right:  s] Calf Left: Right: Point of Measurement: 33 cm From Medial Instep 41.4 cm Ankle Left: Right: Point of Measurement: 11 cm From Medial Instep 24.5 cm Electronic Signature(s) Signed: 06/12/2022 4:38:40 PM By: Erenest Blank Entered By: Erenest Blank on 06/09/2022 10:24:26 -------------------------------------------------------------------------------- Multi Wound Chart Details Patient Name: Date of Service: [name], [name] 06/09/2022 10:15 A M Medical Record Number: [id number] Patient Account Number: [id number] Date of Birth/Sex: Treating RN: 1945/10/17 (77 y.o. M) Primary Care Esteen Delpriore: Billey Gosling Other Clinician: Referring Wadsworth Skolnick: Treating Branton Einstein/Extender: [name] in Treatment:  [address] [place] ([id number]) [id number] Page 3 of 8 Vital Signs Height(in): 66 Pulse(bpm): 47 Weight(lbs): 145 Blood Pressure(mmHg): 131/80 Body Mass Index(BMI): 23.4 Temperature(F): 97.7 Respiratory Rate(breaths/min): 20 [1:Photos:] [N/A:N/A] Right, Lateral Lower Leg N/A N/A Wound Location: Gradually Appeared N/A N/A Wounding Event: Diabetic Wound/Ulcer of the Lower N/A N/A Primary Etiology: Extremity Cataracts, Sleep Apnea, Congestive N/A N/A Comorbid History: Heart Failure, Hypertension, Myocardial Infarction, Type II Diabetes, Gout 08/15/2021 N/A N/A Date Acquired: 31 N/A N/A Weeks of Treatment: Open N/A N/A Wound Status: No N/A N/A Wound Recurrence: 0.7x0.4x0.2 N/A N/A Measurements L x W x D (cm) 0.22 N/A N/A A (cm) : rea 0.044 N/A N/A Volume (cm) : 99.00% N/A N/A % Reduction in A rea: 98.00% N/A N/A % Reduction in Volume: Grade 2 N/A N/A Classification: Medium N/A N/A Exudate A mount: Serosanguineous N/A N/A Exudate Type: red, brown N/A N/A Exudate Color: Distinct, outline attached N/A N/A Wound Margin: Large (67-100%) N/A N/A Granulation A mount: Red, Pink N/A N/A Granulation Quality: None Present  (0%) N/A N/A Necrotic A mount: Fat Layer (Subcutaneous Tissue): Yes N/A N/A Exposed Structures: Fascia: No Tendon: No Muscle: No Joint: No Bone: No Large (67-100%) N/A N/A Epithelialization: Scarring: Yes N/A N/A Periwound Skin Texture: Excoriation: No Induration: No Callus: No Crepitus: No Rash: No Maceration: No N/A N/A Periwound Skin Moisture: Dry/Scaly: No Atrophie Blanche: No N/A N/A Periwound Skin Color: Cyanosis: No Ecchymosis: No Erythema: No Hemosiderin Staining: No Mottled: No Pallor: No Rubor: No No Abnormality N/A N/A Temperature: Yes N/A N/A Tenderness on Palpation: Compression Therapy N/A N/A Procedures Performed: Treatment Notes Wound #1 (Lower Leg) Wound Laterality: Right, Lateral Cleanser Soap and Water Discharge Instruction: May shower and wash wound with dial antibacterial soap and water prior to dressing change. Wound Cleanser Discharge Instruction: Cleanse the wound with wound cleanser prior to applying a clean dressing using gauze sponges, not tissue or cotton balls. WESTLY, HINNANT ([id number]) [id number] Page 4 of 8 Peri-Wound Care Topical Primary Dressing Endoform 2x2 in Discharge Instruction: Moisten with saline Secondary Dressing ABD Pad, 8x10 Discharge Instruction: Apply over primary dressing as directed. Woven Gauze Sponge, Non-Sterile 4x4 in Discharge Instruction: Apply over primary dressing as directed. Secured With Compression Wrap ThreePress (3 layer compression wrap) Discharge Instruction: Apply three layer compression as directed. Compression Stockings Add-Ons Electronic Signature(s) Signed: 06/09/2022 11:11:08 AM By: Kalman Shan DO Entered By: Kalman Shan on 06/09/2022 11:05:57 -------------------------------------------------------------------------------- Multi-Disciplinary Care Plan Details Patient Name: Date of Service: [name], [name] 06/09/2022 10:15 A M Medical Record  Number: [id number] Patient Account Number: [id number] Date of Birth/Sex: Treating RN: May 28, 1945 (77 y.o. [name] Primary Care Rebeca Valdivia: Billey Gosling Other Clinician: Referring Rhealynn Myhre: Treating Payam Gribble/Extender: Mickle Asper in Treatment: 37 Active Inactive Necrotic Tissue Nursing Diagnoses: Knowledge deficit related to management of necrotic/devitalized tissue Goals: Necrotic/devitalized tissue will be minimized in the wound bed Date Initiated: 11/01/2021 Target Resolution Date: 06/16/2022 Goal Status: Active Patient/caregiver will verbalize understanding of reason and process for debridement of necrotic tissue Date Initiated: 11/01/2021 Date Inactivated: 12/05/2021 Target Resolution Date: 12/09/2021 Goal Status: Met Interventions: Assess patient pain level pre-, during and post procedure and prior to discharge Provide education on necrotic tissue and debridement process Treatment Activities: Apply topical anesthetic as ordered : 11/01/2021 Enzymatic debridement : 11/01/2021 Excisional debridement : 11/01/2021 Notes: Pain, Acute or Chronic CONAL, SHETLEY ([id number]) [id number].pdf Page 5 of 8 Nursing Diagnoses: Pain, acute or chronic: actual or potential Potential alteration in comfort, pain  Goals: Patient will verbalize adequate pain control and receive pain control interventions during procedures as needed Date Initiated: 11/01/2021 Date Inactivated: 11/21/2021 Target Resolution Date: 12/09/2021 Goal Status: Met Patient/caregiver will verbalize comfort level met Date Initiated: 11/01/2021 Target Resolution Date: 06/16/2022 Goal Status: Active Interventions: Complete pain assessment as per visit requirements Provide education on pain management Treatment Activities: Administer pain control measures as ordered : 11/01/2021 Notes: Electronic Signature(s) Signed: 06/14/2022 8:39:24 AM By: Rhae Hammock RN Entered By:  Rhae Hammock on 06/09/2022 11:00:26 -------------------------------------------------------------------------------- Pain Assessment Details Patient Name: Date of Service: [name], [name] 06/09/2022 10:15 A M Medical Record Number: [id number] Patient Account Number: [id number] Date of Birth/Sex: Treating RN: [birth year] (77 y.o. Corey [name] Primary Care Prima Rayner: Billey Gosling Other Clinician: Referring Margert Edsall: Treating Jodeci Rini/Extender: Mickle Asper in Treatment: 42 Active Problems Location of Pain Severity and Description of Pain Patient Has Paino No Site Locations Rate the pain. Current Pain Level: 0 Pain Management and Medication Current Pain Management: Medication: No Cold Application: No Rest: No Massage: No Activity: No T.E.N.S.: No Heat Application: No Leg drop or elevation: No Is the Current Pain Management Adequate: Adequate How does your wound impact your activities of daily livingo Sleep: No Bathing: No JAIMERE, FEUTZ ([id number]) [phone].pdf Page 6 of 8 Appetite: No Relationship With Others: No Bladder Continence: No Emotions: No Bowel Continence: No Work: No Toileting: No Drive: No Dressing: No Hobbies: No Engineer, maintenance) Signed: 06/09/2022 4:56:20 PM By: Deon Pilling RN, BSN Entered By: Deon Pilling on 06/09/2022 10:18:05 -------------------------------------------------------------------------------- Patient/Caregiver Education Details Patient Name: Date of Service: Corey [name] 1/26/2024andnbsp10:15 [place] Record Number: [id number] Patient Account Number: [id number] Date of Birth/Gender: Treating RN: 1945/08/03 (77 y.o. [name] Primary Care Physician: Billey Gosling Other Clinician: Referring Physician: Treating Physician/Extender: Mickle Asper in Treatment: 33 Education Assessment Education Provided To: Patient Education Topics  Provided Wound/Skin Impairment: Methods: Explain/Verbal Responses: Reinforcements needed, State content correctly Electronic Signature(s) Signed: 06/14/2022 8:39:24 AM By: Rhae Hammock RN Entered By: Rhae Hammock on 06/09/2022 11:01:02 -------------------------------------------------------------------------------- Wound Assessment Details Patient Name: Date of Service: [name], [name] 06/09/2022 10:15 A M Medical Record Number: [id number] Patient Account Number: [id number] Date of Birth/Sex: Treating RN: 02-19-1946 (77 y.o. M) Primary Care Luzmaria Devaux: Billey Gosling Other Clinician: Referring Jaycie Kregel: Treating Avyukth Bontempo/Extender: [name] in Treatment: 31 Wound Status Wound Number: 1 Primary Diabetic Wound/Ulcer of the Lower Extremity Etiology: Wound Location: Right, Lateral Lower Leg Wound Open Wounding Event: Gradually Appeared Status: Date Acquired: 08/15/2021 Comorbid Cataracts, Sleep Apnea, Congestive Heart Failure, Hypertension, Weeks Of Treatment: 31 History: Myocardial Infarction, Type II Diabetes, Gout Clustered Wound: No Photos CADON, RACZKA ([id number]) [phone].pdf Page 7 of 8 Wound Measurements Length: (cm) 0.7 Width: (cm) 0.4 Depth: (cm) 0.2 Area: (cm) 0.22 Volume: (cm) 0.044 % Reduction in Area: 99% % Reduction in Volume: 98% Epithelialization: Large (67-100%) Tunneling: No Undermining: No Wound Description Classification: Grade 2 Wound Margin: Distinct, outline attached Exudate Amount: Medium Exudate Type: Serosanguineous Exudate Color: red, brown Foul Odor After Cleansing: No Slough/Fibrino Yes Wound Bed Granulation Amount: Large (67-100%) Exposed Structure Granulation Quality: Red, Pink Fascia Exposed: No Necrotic Amount: None Present (0%) Fat Layer (Subcutaneous Tissue) Exposed: Yes Tendon Exposed: No Muscle Exposed: No Joint Exposed: No Bone Exposed: No Periwound Skin  Texture Texture Color No Abnormalities Noted: No No Abnormalities Noted: No Callus: No Atrophie Blanche: No Crepitus: No Cyanosis: No Excoriation: No  Ecchymosis: No Induration: No Erythema: No Rash: No Hemosiderin Staining: No Scarring: Yes Mottled: No Pallor: No Moisture Rubor: No No Abnormalities Noted: No Dry / Scaly: No Temperature / Pain Maceration: No Temperature: No Abnormality Tenderness on Palpation: Yes Treatment Notes Wound #1 (Lower Leg) Wound Laterality: Right, Lateral Cleanser Soap and Water Discharge Instruction: May shower and wash wound with dial antibacterial soap and water prior to dressing change. Wound Cleanser Discharge Instruction: Cleanse the wound with wound cleanser prior to applying a clean dressing using gauze sponges, not tissue or cotton balls. Peri-Wound Care Topical Primary Dressing Endoform 2x2 in Discharge Instruction: Moisten with saline Secondary Dressing ABD Pad, 8x10 Discharge Instruction: Apply over primary dressing as directed. Woven Gauze Sponge, Non-Sterile 4x4 in Discharge Instruction: Apply over primary dressing as directed. BAYLER, GEHRIG ([id number]) [id number] Page 8 of 8 Secured With Compression Wrap ThreePress (3 layer compression wrap) Discharge Instruction: Apply three layer compression as directed. Compression Stockings Add-Ons Electronic Signature(s) Signed: 06/12/2022 4:38:40 PM By: Erenest Blank Entered By: Erenest Blank on 06/09/2022 10:25:57 -------------------------------------------------------------------------------- Vitals Details Patient Name: Date of Service: [name], [name] 06/09/2022 10:15 A M Medical Record Number: [id number] Patient Account Number: [id number] Date of Birth/Sex: Treating RN: 06-Jul-1945 (77 y.o. Corey [name] Primary Care Merle Whitehorn: Billey Gosling Other Clinician: Referring Bryana Froemming: Treating Eliakim Tendler/Extender: Mickle Asper in  Treatment: 31 Vital Signs Time Taken: 10:17 Temperature (F): 97.7 Height (in): 66 Pulse (bpm): 47 Weight (lbs): 145 Respiratory Rate (breaths/min): 20 Body Mass Index (BMI): 23.4 Blood Pressure (mmHg): 131/80 Reference Range: 80 - 120 mg / dl Electronic Signature(s) Signed: 06/09/2022 4:56:20 PM By: Deon Pilling RN, BSN Entered By: Deon Pilling on 06/09/2022 10:17:58

## 2022-06-15 ENCOUNTER — Telehealth: Payer: Self-pay | Admitting: Cardiology

## 2022-06-15 ENCOUNTER — Telehealth: Payer: Self-pay | Admitting: Internal Medicine

## 2022-06-15 DIAGNOSIS — Z79899 Other long term (current) drug therapy: Secondary | ICD-10-CM

## 2022-06-15 DIAGNOSIS — R600 Localized edema: Secondary | ICD-10-CM

## 2022-06-15 DIAGNOSIS — I5042 Chronic combined systolic (congestive) and diastolic (congestive) heart failure: Secondary | ICD-10-CM

## 2022-06-15 NOTE — Telephone Encounter (Signed)
  Pt c/o swelling: STAT is pt has developed SOB within 24 hours  If swelling, where is the swelling located? Lower left leg   How much weight have you gained and in what time span? 7 lbs this week  Have you gained 3 pounds in a day or 5 pounds in a week?   Do you have a log of your daily weights (if so, list)?   Are you currently taking a fluid pill? Yes   Are you currently SOB? Yes   Have you traveled recently? No   Pt's wife called with home health nurse. They said, pt has weeping edema on his lower left leg and gained 7 lbs this week

## 2022-06-15 NOTE — Telephone Encounter (Signed)
Returned call to patient's spouse, [name].  She states patient has gained 7 lbs this week and has weeping edema with a wound on his left leg. She states patient does have SOB but this has been chronic for the past few weeks and has not changed in the last 2-3 weeks. Denies any chest pain, dizziness or other symptoms. Denies increased salt intake. Reports urine output is WNL.  [name] states patient has been taking Torsemide '40mg'$  once daily.  Recent BMET performed on 06/14/2022: Glucose 70 - 99 mg/dL 109 High         BUN 8 - 27 mg/dL 61 High         Creatinine, Ser 0.76 - 1.27 mg/dL 3.32 High         eGFR >59 mL/min/1.73 19 Low         BUN/Creatinine Ratio 10 - 24 18        Sodium 134 - 144 mmol/L 140        Potassium 3.5 - 5.2 mmol/L 4.7        Chloride 96 - 106 mmol/L 105        CO2 20 - 29 mmol/L 29        Calcium 8.6 - 10.2 mg/dL 9.6          Will forward to Dr. Marlou Porch to review and advise.

## 2022-06-15 NOTE — Telephone Encounter (Signed)
Corey [name] called from [facility] and stated that she just evaluated this patient and she stated concerns about his vitals. She said the patient has a history of heart failure. She stated that the patient has edema on the left leg. The patient has lost 6-7 pounds in the past week and Deidra believes this patient is experience heart failure currently. She would like a callback at [phone].

## 2022-06-15 NOTE — Telephone Encounter (Signed)
Corey [name] got patient in touch with cardiology today and message taken and sent to Dr. Marlou Porch to advise.

## 2022-06-15 NOTE — Telephone Encounter (Signed)
Message left for Corey [name] today to contact office.

## 2022-06-16 MED ORDER — TORSEMIDE 40 MG PO TABS: 40.0000 mg | ORAL_TABLET | Freq: Two times a day (BID) | ORAL | 3 refills | Status: DC

## 2022-06-16 MED ORDER — METOLAZONE 5 MG PO TABS: 5.0000 mg | ORAL_TABLET | ORAL | 2 refills | Status: DC

## 2022-06-16 NOTE — Telephone Encounter (Signed)
  Lets increase his torsemide from 40 mg once a day to 40 mg twice a day. Lets add metolazone 5 mg once a week, Monday for instance. Repeat basic metabolic profile in 2 weeks.  Candee Furbish, MD   Pt's wife [facility] aware of changes in medication.  Will come in 2/19 for lab.

## 2022-06-16 NOTE — Progress Notes (Addendum)
PATIENT NAME: Corey [name] DOB: [birth year] MRN: [id number]  PRIMARY CARE PROVIDER: [name], MD  RESPONSIBLE PARTY:  Acct ID - Guarantor Home Phone Work Phone Relationship Acct Type  [id number] MONTI, JILEK [phone]  Self P/F     [address], [place],  [address]   I connected with  Corey [name] on 06/13/22 by telephone and verified that I am speaking with the correct person using two identifiers.   I discussed the limitations of evaluation and management by telemedicine. The patient expressed understanding and agreed to proceed.   Palliative Care Follow Up Encounter Note   Completed telephone visit. Wife also present.     HISTORY OF PRESENT ILLNESS:  77 year old African American male with cardiac amyloid (ATTR) on tafamidis, history of PEA arrest, pericardial effusion s/p window, hypertension, hyperlipidemia, CKD now progressing towards ESRD, type 2 diabetes, history of CVA and recently diagnosed atrial fibrillation/AFL  Since last visit: Torsemide increased to twice a day. Patient reports less swelling to fistula site.    Mobility: walks without assist   Wound Care: Goes to wound center weekly; services with [facility] ended on 06/09/22 Respiratory: get SOB when ambulating; reports he will sit down and rest if he feels    Sleeping Pattern: nods off during the day but doesn't sleep well at night - sent an email to PCP  Pain: denies pain at this time; Tylenol when he has back pain; pt says that it works for an hour or 2 and would like PCP to know - sent PCP an email    CODE STATUS: Full Code ADVANCED DIRECTIVES: N MOST FORM: No PPS:   Next Appt Scheduled For: 07/14/22 @ 0930am      Corey Renne Georgann Housekeeper, RN

## 2022-06-19 ENCOUNTER — Encounter (HOSPITAL_BASED_OUTPATIENT_CLINIC_OR_DEPARTMENT_OTHER): Payer: Medicare HMO | Attending: Internal Medicine | Admitting: Internal Medicine

## 2022-06-19 ENCOUNTER — Other Ambulatory Visit (HOSPITAL_COMMUNITY): Payer: Self-pay

## 2022-06-19 ENCOUNTER — Telehealth: Payer: Self-pay | Admitting: Pharmacist Clinician (PhC)/ Clinical Pharmacy Specialist

## 2022-06-19 DIAGNOSIS — S91302A Unspecified open wound, left foot, initial encounter: Secondary | ICD-10-CM | POA: Insufficient documentation

## 2022-06-19 DIAGNOSIS — N184 Chronic kidney disease, stage 4 (severe): Secondary | ICD-10-CM | POA: Diagnosis not present

## 2022-06-19 DIAGNOSIS — I5042 Chronic combined systolic (congestive) and diastolic (congestive) heart failure: Secondary | ICD-10-CM | POA: Insufficient documentation

## 2022-06-19 DIAGNOSIS — X58XXXD Exposure to other specified factors, subsequent encounter: Secondary | ICD-10-CM | POA: Diagnosis not present

## 2022-06-19 DIAGNOSIS — I87312 Chronic venous hypertension (idiopathic) with ulcer of left lower extremity: Secondary | ICD-10-CM

## 2022-06-19 DIAGNOSIS — E11621 Type 2 diabetes mellitus with foot ulcer: Secondary | ICD-10-CM | POA: Insufficient documentation

## 2022-06-19 DIAGNOSIS — S81802A Unspecified open wound, left lower leg, initial encounter: Secondary | ICD-10-CM | POA: Insufficient documentation

## 2022-06-19 DIAGNOSIS — I130 Hypertensive heart and chronic kidney disease with heart failure and stage 1 through stage 4 chronic kidney disease, or unspecified chronic kidney disease: Secondary | ICD-10-CM | POA: Insufficient documentation

## 2022-06-19 DIAGNOSIS — L97918 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 with other specified severity: Secondary | ICD-10-CM | POA: Insufficient documentation

## 2022-06-19 DIAGNOSIS — I25119 Atherosclerotic heart disease of native coronary artery with unspecified angina pectoris: Secondary | ICD-10-CM | POA: Insufficient documentation

## 2022-06-19 DIAGNOSIS — E1122 Type 2 diabetes mellitus with diabetic chronic kidney disease: Secondary | ICD-10-CM | POA: Insufficient documentation

## 2022-06-19 DIAGNOSIS — I87313 Chronic venous hypertension (idiopathic) with ulcer of bilateral lower extremity: Secondary | ICD-10-CM | POA: Diagnosis not present

## 2022-06-19 DIAGNOSIS — E11622 Type 2 diabetes mellitus with other skin ulcer: Secondary | ICD-10-CM | POA: Insufficient documentation

## 2022-06-19 DIAGNOSIS — I87311 Chronic venous hypertension (idiopathic) with ulcer of right lower extremity: Secondary | ICD-10-CM | POA: Diagnosis not present

## 2022-06-19 MED ORDER — VYNDAMAX 61 MG PO CAPS
61.0000 mg | ORAL_CAPSULE | Freq: Every day | ORAL | 12 refills | Status: DC
  Filled 2022-06-19 – 2022-06-20 (×3): qty 30, 30d supply, fill #0
  Filled 2022-08-24: qty 30, 30d supply, fill #1
  Filled 2022-09-11: qty 30, 30d supply, fill #2
  Filled 2022-10-12: qty 30, 30d supply, fill #3
  Filled 2022-11-06: qty 30, 30d supply, fill #4
  Filled 2022-12-05: qty 30, 30d supply, fill #5
  Filled 2023-01-08: qty 30, 30d supply, fill #6
  Filled 2023-02-16 (×2): qty 30, 30d supply, fill #7
  Filled 2023-03-13: qty 30, 30d supply, fill #8

## 2022-06-19 NOTE — Telephone Encounter (Signed)
Received call from [facility] - they now require that patients use [employer] grant to pay for Vyndamax before [employer] will support them   [place] ID [id number] [place] PCN  [id number] GRP [id number]

## 2022-06-20 ENCOUNTER — Other Ambulatory Visit: Payer: Self-pay

## 2022-06-20 ENCOUNTER — Other Ambulatory Visit (HOSPITAL_COMMUNITY): Payer: Self-pay

## 2022-06-20 NOTE — Progress Notes (Signed)
Preprocedure labs are stable and within acceptable range to proceed with ablation. Due to renal disease, he is not appropriate for pre-procedure CT and will need a TEE.

## 2022-06-21 ENCOUNTER — Other Ambulatory Visit: Payer: Self-pay

## 2022-06-22 ENCOUNTER — Other Ambulatory Visit: Payer: Self-pay

## 2022-06-22 MED ORDER — TORSEMIDE 40 MG PO TABS: 40.0000 mg | ORAL_TABLET | Freq: Two times a day (BID) | ORAL | 3 refills | Status: DC

## 2022-06-22 NOTE — Pre-Procedure Instructions (Signed)
Attempted to call patient regarding procedure instructions for tomorrow.  Left voice mail on the following items: Arrival time 0830 Nothing to eat or drink after midnight No meds AM of procedure Responsible person to drive you home and stay with you for 24 hrs  Have you missed any doses of anti-coagulant Eliquis- if you have missed any doses please let office know right away.  Take doses today, don't take Eliquis in the morning.

## 2022-06-23 ENCOUNTER — Other Ambulatory Visit: Payer: Self-pay

## 2022-06-23 ENCOUNTER — Telehealth: Payer: Self-pay

## 2022-06-23 ENCOUNTER — Encounter (HOSPITAL_COMMUNITY)
Admission: RE | Disposition: A | Discharge status: Home / Self Care | Payer: Self-pay | Source: Home / Self Care | Attending: Cardiovascular Disease

## 2022-06-23 ENCOUNTER — Ambulatory Visit (HOSPITAL_BASED_OUTPATIENT_CLINIC_OR_DEPARTMENT_OTHER): Payer: Medicare HMO

## 2022-06-23 ENCOUNTER — Ambulatory Visit (HOSPITAL_BASED_OUTPATIENT_CLINIC_OR_DEPARTMENT_OTHER): Payer: Medicare HMO | Admitting: Anesthesiology

## 2022-06-23 ENCOUNTER — Ambulatory Visit (HOSPITAL_COMMUNITY)
Admission: RE | Admit: 2022-06-23 | Discharge: 2022-06-23 | Disposition: A | Discharge status: Home / Self Care | Payer: Medicare HMO | Attending: Cardiovascular Disease | Admitting: Cardiovascular Disease

## 2022-06-23 ENCOUNTER — Ambulatory Visit (HOSPITAL_COMMUNITY): Payer: Medicare HMO | Admitting: Anesthesiology

## 2022-06-23 DIAGNOSIS — I110 Hypertensive heart disease with heart failure: Secondary | ICD-10-CM

## 2022-06-23 DIAGNOSIS — I132 Hypertensive heart and chronic kidney disease with heart failure and with stage 5 chronic kidney disease, or end stage renal disease: Secondary | ICD-10-CM | POA: Diagnosis not present

## 2022-06-23 DIAGNOSIS — I2510 Atherosclerotic heart disease of native coronary artery without angina pectoris: Secondary | ICD-10-CM | POA: Diagnosis not present

## 2022-06-23 DIAGNOSIS — I4891 Unspecified atrial fibrillation: Secondary | ICD-10-CM | POA: Insufficient documentation

## 2022-06-23 DIAGNOSIS — I4719 Other supraventricular tachycardia: Secondary | ICD-10-CM | POA: Insufficient documentation

## 2022-06-23 DIAGNOSIS — Z87891 Personal history of nicotine dependence: Secondary | ICD-10-CM | POA: Diagnosis not present

## 2022-06-23 DIAGNOSIS — Z79899 Other long term (current) drug therapy: Secondary | ICD-10-CM | POA: Insufficient documentation

## 2022-06-23 DIAGNOSIS — I484 Atypical atrial flutter: Secondary | ICD-10-CM | POA: Diagnosis not present

## 2022-06-23 DIAGNOSIS — I252 Old myocardial infarction: Secondary | ICD-10-CM

## 2022-06-23 DIAGNOSIS — F17210 Nicotine dependence, cigarettes, uncomplicated: Secondary | ICD-10-CM | POA: Diagnosis not present

## 2022-06-23 DIAGNOSIS — I509 Heart failure, unspecified: Secondary | ICD-10-CM | POA: Diagnosis not present

## 2022-06-23 DIAGNOSIS — Z992 Dependence on renal dialysis: Secondary | ICD-10-CM | POA: Diagnosis not present

## 2022-06-23 DIAGNOSIS — I361 Nonrheumatic tricuspid (valve) insufficiency: Secondary | ICD-10-CM

## 2022-06-23 DIAGNOSIS — I5042 Chronic combined systolic (congestive) and diastolic (congestive) heart failure: Secondary | ICD-10-CM | POA: Insufficient documentation

## 2022-06-23 DIAGNOSIS — G4733 Obstructive sleep apnea (adult) (pediatric): Secondary | ICD-10-CM | POA: Insufficient documentation

## 2022-06-23 DIAGNOSIS — D649 Anemia, unspecified: Secondary | ICD-10-CM | POA: Diagnosis not present

## 2022-06-23 DIAGNOSIS — I071 Rheumatic tricuspid insufficiency: Secondary | ICD-10-CM | POA: Diagnosis not present

## 2022-06-23 DIAGNOSIS — I483 Typical atrial flutter: Secondary | ICD-10-CM | POA: Insufficient documentation

## 2022-06-23 DIAGNOSIS — Z7901 Long term (current) use of anticoagulants: Secondary | ICD-10-CM | POA: Diagnosis not present

## 2022-06-23 DIAGNOSIS — N186 End stage renal disease: Secondary | ICD-10-CM | POA: Diagnosis not present

## 2022-06-23 DIAGNOSIS — E1122 Type 2 diabetes mellitus with diabetic chronic kidney disease: Secondary | ICD-10-CM | POA: Insufficient documentation

## 2022-06-23 DIAGNOSIS — I480 Paroxysmal atrial fibrillation: Secondary | ICD-10-CM

## 2022-06-23 DIAGNOSIS — D6869 Other thrombophilia: Secondary | ICD-10-CM | POA: Insufficient documentation

## 2022-06-23 HISTORY — PX: A-FLUTTER ABLATION: EP1230

## 2022-06-23 HISTORY — PX: TEE WITHOUT CARDIOVERSION: SHX5443

## 2022-06-23 LAB — GLUCOSE, CAPILLARY: Glucose-Capillary: 76 mg/dL (ref 70–99)

## 2022-06-23 SURGERY — A-FLUTTER ABLATION
Anesthesia: General

## 2022-06-23 MED ORDER — LIDOCAINE 2% (20 MG/ML) 5 ML SYRINGE
INTRAMUSCULAR | Status: DC | PRN
  Administered 2022-06-23: 80 mg via INTRAVENOUS

## 2022-06-23 MED ORDER — SODIUM CHLORIDE 0.9% FLUSH: 3.0000 mL | INTRAVENOUS | Status: DC | PRN

## 2022-06-23 MED ORDER — PHENYLEPHRINE HCL-NACL 20-0.9 MG/250ML-% IV SOLN
INTRAVENOUS | Status: DC | PRN
  Administered 2022-06-23: 60 ug/min via INTRAVENOUS

## 2022-06-23 MED ORDER — FENTANYL CITRATE (PF) 250 MCG/5ML IJ SOLN
INTRAMUSCULAR | Status: DC | PRN
  Administered 2022-06-23: 100 ug via INTRAVENOUS

## 2022-06-23 MED ORDER — ROCURONIUM BROMIDE 10 MG/ML (PF) SYRINGE
PREFILLED_SYRINGE | INTRAVENOUS | Status: DC | PRN
  Administered 2022-06-23: 50 mg via INTRAVENOUS

## 2022-06-23 MED ORDER — SODIUM CHLORIDE 0.9 % IV SOLN: 250.0000 mL | INTRAVENOUS | Status: DC | PRN

## 2022-06-23 MED ORDER — EPHEDRINE SULFATE-NACL 50-0.9 MG/10ML-% IV SOSY
PREFILLED_SYRINGE | INTRAVENOUS | Status: DC | PRN
  Administered 2022-06-23 (×2): 2.5 mg via INTRAVENOUS
  Administered 2022-06-23: 5 mg via INTRAVENOUS

## 2022-06-23 MED ORDER — HEPARIN (PORCINE) IN NACL 1000-0.9 UT/500ML-% IV SOLN
INTRAVENOUS | Status: AC
  Filled 2022-06-23: qty 500

## 2022-06-23 MED ORDER — HEPARIN SODIUM (PORCINE) 1000 UNIT/ML IJ SOLN
INTRAMUSCULAR | Status: DC | PRN
  Administered 2022-06-23: 5000 [IU] via INTRAVENOUS

## 2022-06-23 MED ORDER — ONDANSETRON HCL 4 MG/2ML IJ SOLN
INTRAMUSCULAR | Status: DC | PRN
  Administered 2022-06-23: 4 mg via INTRAVENOUS

## 2022-06-23 MED ORDER — HEPARIN SODIUM (PORCINE) 1000 UNIT/ML IJ SOLN
INTRAMUSCULAR | Status: DC | PRN
  Administered 2022-06-23: 1000 [IU] via INTRAVENOUS

## 2022-06-23 MED ORDER — SUGAMMADEX SODIUM 200 MG/2ML IV SOLN
INTRAVENOUS | Status: DC | PRN
  Administered 2022-06-23: 200 mg via INTRAVENOUS

## 2022-06-23 MED ORDER — SODIUM CHLORIDE 0.9% FLUSH: 3.0000 mL | Freq: Two times a day (BID) | INTRAVENOUS | Status: DC

## 2022-06-23 MED ORDER — PROPOFOL 10 MG/ML IV BOLUS
INTRAVENOUS | Status: DC | PRN
  Administered 2022-06-23: 100 mg via INTRAVENOUS

## 2022-06-23 MED ORDER — HEPARIN (PORCINE) IN NACL 1000-0.9 UT/500ML-% IV SOLN
INTRAVENOUS | Status: DC | PRN
  Administered 2022-06-23 (×3): 500 mL

## 2022-06-23 MED ORDER — ACETAMINOPHEN 325 MG PO TABS: 650.0000 mg | ORAL_TABLET | ORAL | Status: DC | PRN

## 2022-06-23 MED ORDER — SODIUM CHLORIDE 0.9 % IV SOLN: INTRAVENOUS | Status: DC

## 2022-06-23 MED ORDER — HEPARIN SODIUM (PORCINE) 1000 UNIT/ML IJ SOLN
INTRAMUSCULAR | Status: AC
  Filled 2022-06-23: qty 10

## 2022-06-23 MED ORDER — ONDANSETRON HCL 4 MG/2ML IJ SOLN: 4.0000 mg | Freq: Four times a day (QID) | INTRAMUSCULAR | Status: DC | PRN

## 2022-06-23 SURGICAL SUPPLY — 18 items
BAG SNAP BAND KOVER 36X36 (MISCELLANEOUS) IMPLANT
CATH 8FR REPROCESSED SOUNDSTAR (CATHETERS) ×1 IMPLANT
CATH 8FR SOUNDSTAR REPROCESSED (CATHETERS) IMPLANT
CATH ABLAT QDOT MICRO BI TC DF (CATHETERS) IMPLANT
CATH OCTARAY 2.0 F 3-3-3-3-3 (CATHETERS) IMPLANT
CATH PIGTAIL STEERABLE D1 8.7 (WIRE) IMPLANT
CATH WEBSTER BI DIR CS D-F CRV (CATHETERS) IMPLANT
CLOSURE PERCLOSE PROSTYLE (VASCULAR PRODUCTS) IMPLANT
DEVICE CLOSURE MYNXGRIP 6/7F (Vascular Products) IMPLANT
PACK EP LATEX FREE (CUSTOM PROCEDURE TRAY) ×1
PACK EP LF (CUSTOM PROCEDURE TRAY) ×2 IMPLANT
PAD DEFIB RADIO PHYSIO CONN (PAD) ×2 IMPLANT
PATCH CARTO3 (PAD) IMPLANT
SHEATH CARTO VIZIGO MED CURVE (SHEATH) IMPLANT
SHEATH PINNACLE 8F 10CM (SHEATH) IMPLANT
SHEATH PINNACLE 9F 10CM (SHEATH) IMPLANT
SHEATH PROBE COVER 6X72 (BAG) IMPLANT
TUBING SMART ABLATE COOLFLOW (TUBING) IMPLANT

## 2022-06-23 NOTE — Telephone Encounter (Signed)
Called and spoke with wife.  Patient currently in hospital right now.  Encouraged spouse to call us and let us know if he still wasn't sleeping when he got home so we could set up office visit to discuss.

## 2022-06-23 NOTE — Anesthesia Procedure Notes (Signed)
Procedure Name: Intubation Date/Time: 06/23/2022 11:02 AM  Performed by: Heide Scales, CRNAPre-anesthesia Checklist: Patient identified, Emergency Drugs available, Suction available and Patient being monitored Patient Re-evaluated:Patient Re-evaluated prior to induction Oxygen Delivery Method: Circle system utilized Preoxygenation: Pre-oxygenation with 100% oxygen Induction Type: IV induction Ventilation: Mask ventilation without difficulty and Oral airway inserted - appropriate to patient size Laryngoscope Size: Mac and 4 Grade View: Grade I Tube type: Oral Tube size: 7.5 mm Number of attempts: 1 Airway Equipment and Method: Stylet and Oral airway Placement Confirmation: ETT inserted through vocal cords under direct vision, positive ETCO2 and breath sounds checked- equal and bilateral Secured at: 23 cm Tube secured with: Tape Dental Injury: Teeth and Oropharynx as per pre-operative assessment

## 2022-06-23 NOTE — Transfer of Care (Signed)
Immediate Anesthesia Transfer of Care Note  Patient: Corey [name]  Procedure(s) Performed: A-FLUTTER ABLATION TRANSESOPHAGEAL ECHOCARDIOGRAM (TEE)  Patient Location: Cath Lab  Anesthesia Type:General  Level of Consciousness: awake, alert , and oriented  Airway & Oxygen Therapy: Patient Spontanous Breathing and Patient connected to nasal cannula oxygen  Post-op Assessment: Report given to RN, Post -op Vital signs reviewed and stable, and Patient moving all extremities X 4  Post vital signs: Reviewed and stable  Last Vitals:  Vitals Value Taken Time  BP 123/77 06/23/22 1352  Temp 36.8 C 06/23/22 1352  Pulse 66 06/23/22 1353  Resp 20 06/23/22 1353  SpO2 92 % 06/23/22 1353  Vitals shown include unvalidated device data.  Last Pain:  Vitals:   06/23/22 1352  TempSrc: Temporal  PainSc: 0-No pain         Complications: There were no known notable events for this encounter.

## 2022-06-23 NOTE — H&P (Signed)
Electrophysiology Office Note:    Date:  06/23/2022   ID:  Corey [name], DOB Jul 12, 1945, MRN [id number]  PCP:  [name], MD   [place] [facility] Cardiologist:  Candee Furbish, MD Electrophysiologist:  Melida Quitter, MD     Referring MD: No ref. provider found   History of Present Illness:    Corey [name] is a 77 y.o. male with a hx detailed below, significant for ATTR cardiac amyloid, pericardial effusion status post window, CHFrEF, CAD ESRD approaching dialysis, and atrial fibrillation and flutter referred for arrhythmia management.  In 2022, Corey [name] presented to [facility] with [place]. LHC at that time demonstrated 80% occlusion of the LAD, however, PCI was deferred due to markedly elevated LVEDP. LVEF by echo at that time of 35%-40%. Due to a large pericardial effusion, he had a window done with biopsies at that time compatible with amyloidosis. AL evaluation was negative. PYP was equivocal. Genetic testing consistent with Val142Ile TTR.   He was admitted in early November 2023 with volume overload and an atrial fibrillation occasionally organizing into fibrillation. He was diuresed, underwent TEE/DCCV x 2. When in sinus rhythm, he was often bradycardic with sinus brady and Mobitz I AV block. I saw him a few times during this admission and recommended placement of a holter monitor on discharge to assess chronotropic competence.  I reviewed the patient's labs. He has not had a CT due to renal dysfunction. I spoke with the patient's wife. She confirmed that he  has not missed any doses of anticoagulation, and he took his dose last night. There have been no changes in the patient's diagnoses, medications, or condition since our recent clinic visit.     Past Medical History:  Diagnosis Date   Cataract    CHF (congestive heart failure) (HCC)    Chronic heart failure with preserved ejection fraction (HFpEF) (HCC)    Colon polyp 2003   Dr [name]; F/U was  to be 2008( not completed)   Coronary artery disease    CVA (cerebral infarction) 2014   Diabetes mellitus    Diverticulosis 2003   Dyspnea    with exertion   Gout    Hereditary cardiac amyloidosis (HCC)    Hypertension    Myocardial infarction ([place]) 09/19/2012   PEA cardiac arrest in setting of acute respiratory failure/pulmonary edema   OSA (obstructive sleep apnea) 03/27/2018   Pneumonia 09/29/2011   Avelox X 10 days as OP   Prostate cancer ([place])    Renal insufficiency    Seizures ([place]) 09/19/2012   not treated for seizure disorder; had a seizure after stroke 2014; no seizure since then   Stroke [facility])     Past Surgical History:  Procedure Laterality Date   A/V FISTULAGRAM Left 04/28/2022   Procedure: A/V Fistulagram;  Surgeon: Angelia Mould, MD;  Location: [place] CV LAB;  Service: Cardiovascular;  Laterality: Left;   AV FISTULA PLACEMENT Left 01/31/2022   Procedure: LEFT ARM ARTERIOVENOUS (AV) FISTULA CREATION;  Surgeon: Waynetta Sandy, MD;  Location: [place];  Service: Vascular;  Laterality: Left;  regional block to left arm   CARDIOVERSION N/A 03/24/2022   Procedure: CARDIOVERSION;  Surgeon: Hebert Soho, DO;  Location: [place];  Service: Cardiovascular;  Laterality: N/A;   COLONOSCOPY W/ POLYPECTOMY  2003   no F/U ([place] discussed 12/03/12)   INGUINAL HERNIA REPAIR Left 06/16/2021   Procedure: LAPAROSCOPIC, POSSIBLY OPEN LEFT INGUINAL HERNIA REPAIR WITH MESH;  Surgeon:  Stechschulte, Nickola Major, MD;  Location: [facility];  Service: General;  Laterality: Left;   INSERTION OF MESH N/A 07/16/2017   Procedure: INSERTION OF MESH;  Surgeon: [name], MD;  Location: [place];  Service: General;  Laterality: N/A;   PEG PLACEMENT  2014   10/08/12-12/10/12   PEG TUBE REMOVAL  2014   PERIPHERAL VASCULAR BALLOON ANGIOPLASTY  04/28/2022   Procedure: PERIPHERAL VASCULAR BALLOON ANGIOPLASTY;  Surgeon: Angelia Mould, MD;  Location: [place] CV [facility];   Service: Cardiovascular;;   PROSTATE BIOPSY     RIGHT HEART CATH N/A 03/24/2022   Procedure: RIGHT HEART CATH;  Surgeon: Hebert Soho, DO;  Location: [place] CV [facility];  Service: Cardiovascular;  Laterality: N/A;   RIGHT/LEFT HEART CATH AND CORONARY ANGIOGRAPHY N/A 09/20/2020   Procedure: RIGHT/LEFT HEART CATH AND CORONARY ANGIOGRAPHY;  Surgeon: Leonie Man, MD;  Location: [place] [facility];  Service: Cardiovascular;  Laterality: N/A;   TEE WITHOUT CARDIOVERSION N/A 09/27/2020   Procedure: TRANSESOPHAGEAL ECHOCARDIOGRAM (TEE);  Surgeon: Wonda Olds, MD;  Location: [facility] OR;  Service: Thoracic;  Laterality: N/A;   TEE WITHOUT CARDIOVERSION N/A 03/24/2022   Procedure: TRANSESOPHAGEAL ECHOCARDIOGRAM (TEE);  Surgeon: Hebert Soho, DO;  Location: [place] [facility];  Service: Cardiovascular;  Laterality: N/A;   TRACHEOSTOMY  2014   09/30/12-10/20/12   VENTRAL HERNIA REPAIR N/A 07/16/2017   Procedure: LAPAROSCOPIC VENTRAL HERNIA REPAIR WITH MESH;  Surgeon: [name], MD;  Location: [place];  Service: General;  Laterality: N/A;   wrist aspiration  02/16/2012    monosodium urate crystals; Dr Caralyn Guile    Current Medications: Current Meds  Medication Sig   acetaminophen (TYLENOL) 500 MG tablet Take 1,000 mg by mouth every 6 (six) hours as needed for moderate pain.   albuterol (VENTOLIN HFA) 108 (90 Base) MCG/ACT inhaler Inhale 2 puffs into the lungs every 4 (four) hours as needed for wheezing or shortness of breath.   allopurinol (ZYLOPRIM) 100 MG tablet Take 50 mg by mouth in the morning.   apixaban (ELIQUIS) 5 MG TABS tablet Take 1 tablet (5 mg total) by mouth 2 (two) times daily.   colchicine (COLCRYS) 0.6 MG tablet Take 2 tabs once and then one tab one hour later as needed for gout   ergocalciferol (VITAMIN D2) 1.25 MG (50000 UT) capsule Take 50,000 Units by mouth every Monday. Monday   hydrALAZINE (APRESOLINE) 25 MG tablet Take 1 tablet (25 mg total) by mouth 3 (three) times  daily.   metolazone (ZAROXOLYN) 5 MG tablet Take 1 tablet (5 mg total) by mouth once a week. Take (1) tablet once a week on the same day of the week   rosuvastatin (CRESTOR) 5 MG tablet Take 1 tablet (5 mg total) by mouth daily.   sodium bicarbonate 650 MG tablet Take 1 tablet by mouth twice daily   Tafamidis (VYNDAMAX) 61 MG CAPS Take 1 capsule (61 mg total) by mouth daily.   Torsemide 40 MG TABS Take 40 mg by mouth 2 (two) times daily.   [DISCONTINUED] Torsemide 40 MG TABS Take 40 mg by mouth 2 (two) times daily.     Allergies:   Hydrochlorothiazide   Social History   Socioeconomic History   Marital status: Married    Spouse name: [name]   Number of children: 2   Years of education: 12   Highest education level: 12th grade  Occupational History   Not on file  Tobacco Use   Smoking status: Former  Packs/day: 0.25    Years: 1.00    Total pack years: 0.25    Types: Cigarettes    Quit date: 05/15/1968    Years since quitting: 54.1   Smokeless tobacco: Never   Tobacco comments:    smoked [place], up to 1-2 cigarettes/ day  Vaping Use   Vaping Use: Never used  Substance and Sexual Activity   Alcohol use: No    Alcohol/week: 0.0 standard drinks of alcohol   Drug use: No   Sexual activity: Yes  Other Topics Concern   Not on file  Social History Narrative   Walks three times a week, 2-3 miles, occasional walks on treadmill       Patient is right-handed. He lives with his wife in a one level home. He drinks 1 diet Mt. Dew a day and rarely has coffee. He walks 3 x a week for exercise.   Social Determinants of Health   Financial Resource Strain: Low Risk  (02/02/2022)   Overall Financial Resource Strain (CARDIA)    Difficulty of Paying Living Expenses: Not hard at all  Food Insecurity: No Food Insecurity (02/02/2022)   Hunger Vital Sign    Worried About Running Out of Food in the Last Year: Never true    Ran Out of Food in the Last Year: Never true  Transportation  Needs: No Transportation Needs (05/01/2022)   PRAPARE - Hydrologist (Medical): No    Lack of Transportation (Non-Medical): No  Physical Activity: Inactive (02/02/2022)   Exercise Vital Sign    Days of Exercise per Week: 0 days    Minutes of Exercise per Session: 0 min  Stress: No Stress Concern Present (02/02/2022)   [place]    Feeling of Stress : Not at all  Social Connections: [place] (02/02/2022)   Social Connection and Isolation Panel [NHANES]    Frequency of Communication with Friends and Family: More than three times a week    Frequency of Social Gatherings with Friends and Family: More than three times a week    Attends Religious Services: More than 4 times per year    Active Member of [employer] or Organizations: Yes    Attends Music therapist: More than 4 times per year    Marital Status: Married     Family History: The patient's family history includes Cancer in his father; Diabetes in his father and mother; Heart failure in his brother and mother; Hypertension in his father and mother; Prostate cancer in his father. There is no history of Stroke, Heart disease, or Colon cancer.  ROS:   Please see the history of present illness.    All other systems reviewed and are negative.  EKGs/Labs/Other Studies Reviewed Today:    TTE 03/23/2022 Speckled appearance consistent with amyloid Global hypokinesis, EF 30-35%, severe LVH LA and RA are moderately dilated  EKG:  Last EKG results: today - atrial flutter, appears typical   Recent Labs: 01/05/2022: ALT 31; Pro B Natriuretic peptide (BNP) 1,740.0 03/22/2022: B Natriuretic Peptide 1,571.3 06/14/2022: BUN 61; Creatinine, Ser 3.32; Hemoglobin 11.7; Platelets 129; Potassium 4.7; Sodium 140     Physical Exam:    VS:  BP (!) 146/88   Pulse (!) 46   Temp 97.6 F (36.4 C) (Oral)   Resp 16   Ht 5' 6"$  (1.676 m)    Wt 77.1 kg   SpO2 100%   BMI 27.44 kg/m  Wt Readings from Last 3 Encounters:  06/23/22 77.1 kg  05/23/22 84.8 kg  05/23/22 84.6 kg     GEN:  Well nourished, well developed in no acute distress CARDIAC: RRR, no murmurs, rubs, gallops RESPIRATORY:  Normal work of breathing MUSCULOSKELETAL: no edema    ASSESSMENT & PLAN:    Atrial flutter and fibrillation: Per our conversation in clinic, the patient and his wife were agreeable to ablation of his current flutter, but wanted to avoid a more involved left-sided procedure if possible. We will perform a TEE in the event that his current flutter is left-sided, which would necessitate mapping and ablation in the left atrium.  CHFrEF: EF 40% due to cardiac amyloid. On tafamidis 49m daily. Secondary hypercoagulable state: continue apixaban.        Medication Adjustments/Labs and Tests Ordered: Current medicines are reviewed at length with the patient today.  Concerns regarding medicines are outlined above.  Orders Placed This Encounter  Procedures   Informed Consent Details: Physician/Practitioner Attestation; Transcribe to consent form and obtain patient signature   Initiate Pre-op Protocol   Void on call to EP Lab   Confirm CBC and BMP (or CMP) results within 7 days for inpatient and 30 days for outpatient:   Clip right and left femoral area PM before surgery   Clip right internal jugular area PM before surgery   Pre-admission testing diagnosis   Informed Consent Details: Physician/Practitioner Attestation; Transcribe to consent form and obtain patient signature   EP STUDY   ECHO TEE   Insert peripheral IV   Meds ordered this encounter  Medications   0.9 %  sodium chloride infusion     Signed, AMelida Quitter MD  06/23/2022 10:06 AM    [place]

## 2022-06-23 NOTE — Progress Notes (Signed)
Corey [name], Corey [name] ([id number]) [id number] Page 1 of 10 Visit Report for 06/19/2022 Arrival Information Details Patient Name: Date of Service: Corey [name]. 06/19/2022 9:30 A M Medical Record Number: [id number] Patient Account Number: [id number] Date of Birth/Sex: Treating RN: 02/05/1946 (77 y.o. Corey [name] Primary Care Neils [place]: Billey Gosling Other Clinician: Referring Tahjai Schetter: Treating Sebastian Lurz/Extender: Mickle Asper in Treatment: 64 Visit Information History Since Last Visit Added or deleted any medications: No Patient Arrived: Ambulatory Any new allergies or adverse reactions: No Arrival Time: 09:23 Had a fall or experienced change in No Accompanied By: wife activities of daily living that may affect Transfer Assistance: None risk of falls: Patient Identification Verified: Yes Signs or symptoms of abuse/neglect since last visito No Secondary Verification Process Completed: Yes Hospitalized since last visit: No Patient Requires Transmission-Based No Implantable device outside of the clinic excluding No Precautions: cellular tissue based products placed in the center Patient Has Alerts: Yes since last visit: Patient Alerts: in clinic R [place] Has Dressing in Place as Prescribed: Yes 11/17/2021 ABIs: both [place] Has Compression in Place as Prescribed: Yes 11/17/2021 TBI: R0.64 L0.58 Pain Present Now: No Electronic Signature(s) Signed: 06/22/2022 5:19:15 PM By: Sharyn Creamer RN, BSN Entered By: Sharyn Creamer on 06/19/2022 09:23:58 -------------------------------------------------------------------------------- Compression Therapy Details Patient Name: Date of Service: Corey [name], [name] 06/19/2022 9:30 A M Medical Record Number: [id number] Patient Account Number: [id number] Date of Birth/Sex: Treating RN: [birth year] (77 y.o. Corey [name] Primary Care Eliani Leclere: Billey Gosling Other Clinician: Referring Reyce Lubeck: Treating  Kimani Bedoya/Extender: [name] in Treatment: 32 Compression Therapy Performed for Wound Assessment: Wound #1 Right,Lateral Lower Leg Performed By: Clinician Sharyn Creamer, RN Compression Type: Three Layer Post Procedure Diagnosis Same as Pre-procedure Electronic Signature(s) Signed: 06/22/2022 5:19:15 PM By: Sharyn Creamer RN, BSN Entered By: Sharyn Creamer on 06/19/2022 09:45:26 Corey [name] ([id number]) [id number] Page 2 of 10 -------------------------------------------------------------------------------- Compression Therapy Details Patient Name: Date of Service: Corey [name]. 06/19/2022 9:30 A M Medical Record Number: [id number] Patient Account Number: [id number] Date of Birth/Sex: Treating RN: [birth year] (77 y.o. Corey [name] Primary Care Jerita Wimbush: Billey Gosling Other Clinician: Referring Jana Swartzlander: Treating Jaivon Vanbeek/Extender: Mickle Asper in Treatment: 32 Compression Therapy Performed for Wound Assessment: Wound #2 Left,Anterior Lower Leg Performed By: Clinician Sharyn Creamer, RN Compression Type: Three Layer Post Procedure Diagnosis Same as Pre-procedure Electronic Signature(s) Signed: 06/22/2022 5:19:15 PM By: Sharyn Creamer RN, BSN Entered By: Sharyn Creamer on 06/19/2022 09:45:26 -------------------------------------------------------------------------------- Encounter Discharge Information Details Patient Name: Date of Service: Corey [name], [name] 06/19/2022 9:30 A M Medical Record Number: [id number] Patient Account Number: [id number] Date of Birth/Sex: Treating RN: [birth year] (77 y.o. Corey [name] Primary Care Donesha Wallander: Billey Gosling Other Clinician: Referring Jalayiah Bibian: Treating Betzayda Braxton/Extender: Mickle Asper in Treatment: 6 Encounter Discharge Information Items Post Procedure Vitals Discharge Condition: Stable Temperature (F): 97.8 Ambulatory Status:  Ambulatory Pulse (bpm): 71 Discharge Destination: Home Respiratory Rate (breaths/min): 18 Transportation: Private Auto Blood Pressure (mmHg): 141/82 Accompanied By: wife Schedule Follow-up Appointment: Yes Clinical Summary of Care: Patient Declined Electronic Signature(s) Signed: 06/22/2022 5:19:15 PM By: Sharyn Creamer RN, BSN Entered By: Sharyn Creamer on 06/19/2022 10:09:45 -------------------------------------------------------------------------------- Lower Extremity Assessment Details Patient Name: Date of Service: Corey [name], [name] 06/19/2022 9:30 A M Medical Record Number: [id number] Patient Account Number: [id number] Date of Birth/Sex: Treating RN: October 30, 1945 (77 y.o. Corey [name] Primary Care Margerie Fraiser: Billey Gosling  Other Clinician: Referring Takahiro Godinho: Treating Aradhya Shellenbarger/Extender: [name] in Treatment: 32 Edema Assessment Assessed: [Left: No] [Right: No] Edema: [Left: Ye] [Right: s] Calf Corey [name], Corey [name] ([id number]) [id number] Page 3 of 10 Left: Right: Point of Measurement: 33 cm From Medial Instep 47 cm 34 cm Ankle Left: Right: Point of Measurement: 11 cm From Medial Instep 22 cm 21.5 cm Vascular Assessment Pulses: Dorsalis Pedis Palpable: [Left:Yes] [Right:Yes] Electronic Signature(s) Signed: 06/22/2022 5:19:15 PM By: Sharyn Creamer RN, BSN Entered By: Sharyn Creamer on 06/19/2022 09:32:10 -------------------------------------------------------------------------------- Multi Wound Chart Details Patient Name: Date of Service: Corey [name], [name] 06/19/2022 9:30 A M Medical Record Number: [id number] Patient Account Number: [id number] Date of Birth/Sex: Treating RN: Jun 22, 1945 (77 y.o. M) Primary Care Malosi Hemstreet: Billey Gosling Other Clinician: Referring Breslyn Abdo: Treating Carletta Feasel/Extender: Mickle Asper in Treatment: 32 Vital Signs Height(in): 66 Pulse(bpm): 71 Weight(lbs): 145 Blood  Pressure(mmHg): 141/82 Body Mass Index(BMI): 23.4 Temperature(F): 97.8 Respiratory Rate(breaths/min): 18 [1:Photos:] [N/A:N/A] Right, Lateral Lower Leg Left, Anterior Lower Leg N/A Wound Location: Gradually Appeared Gradually Appeared N/A Wounding Event: Diabetic Wound/Ulcer of the Lower Diabetic Wound/Ulcer of the Lower N/A Primary Etiology: Extremity Extremity Cataracts, Sleep Apnea, Congestive Cataracts, Sleep Apnea, Congestive N/A Comorbid History: Heart Failure, Hypertension, Heart Failure, Hypertension, Myocardial Infarction, Type II Myocardial Infarction, Type II Diabetes, Gout Diabetes, Gout 08/15/2021 06/19/2022 N/A Date Acquired: 32 0 N/A Weeks of Treatment: Open Open N/A Wound Status: No No N/A Wound Recurrence: 0.3x0.2x0.1 0.4x0.7x0.1 N/A Measurements L x W x D (cm) 0.047 0.22 N/A A (cm) : rea 0.005 0.022 N/A Volume (cm) : 99.80% N/A N/A % Reduction in A rea: 99.80% N/A N/A % Reduction in Volume: Grade 2 Grade 2 N/A Classification: Medium Medium N/A Exudate A mount: Serosanguineous Serosanguineous N/A Exudate Type: red, brown red, brown N/A Exudate Color: Distinct, outline attached Distinct, outline attached N/A Wound Margin: Large (67-100%) Large (67-100%) N/A Granulation A mountKAIRE, ALANI ([id number]) [id number] Page 4 of 10 Red, Pink Red N/A Granulation Quality: None Present (0%) Small (1-33%) N/A Necrotic Amount: Fat Layer (Subcutaneous Tissue): Yes Fat Layer (Subcutaneous Tissue): Yes N/A Exposed Structures: Fascia: No Fascia: No Tendon: No Tendon: No Muscle: No Muscle: No Joint: No Joint: No Bone: No Bone: No Large (67-100%) None N/A Epithelialization: Debridement - Selective/Open Wound N/A N/A Debridement: Pre-procedure Verification/Time Out 09:42 N/A N/A Taken: Lidocaine 4% Topical Solution N/A N/A Pain Control: Slough N/A N/A Tissue Debrided: Non-Viable Tissue N/A N/A Level: 0.06 N/A  N/A Debridement A (sq cm): rea Curette N/A N/A Instrument: Minimum N/A N/A Bleeding: Pressure N/A N/A Hemostasis A chieved: 0 N/A N/A Procedural Pain: 0 N/A N/A Post Procedural Pain: Procedure was tolerated well N/A N/A Debridement Treatment Response: 0.3x0.2x0.1 N/A N/A Post Debridement Measurements L x W x D (cm) 0.005 N/A N/A Post Debridement Volume: (cm) Scarring: Yes N/A Periwound Skin Texture: Excoriation: No Induration: No Callus: No Crepitus: No Rash: No Maceration: No N/A Periwound Skin Moisture: Dry/Scaly: No Atrophie Blanche: No N/A Periwound Skin Color: Cyanosis: No Ecchymosis: No Erythema: No Hemosiderin Staining: No Mottled: No Pallor: No Rubor: No No Abnormality N/A N/A Temperature: Yes N/A N/A Tenderness on Palpation: Compression Therapy Compression Therapy N/A Procedures Performed: Debridement Treatment Notes Electronic Signature(s) Signed: 06/19/2022 1:21:38 PM By: Kalman Shan DO Entered By: Kalman Shan on 06/19/2022 09:45:53 -------------------------------------------------------------------------------- Multi-Disciplinary Care Plan Details Patient Name: Date of Service: Corey [name], [name] 06/19/2022 9:30 A M Medical Record Number: [id number] Patient Account Number: [id number] Date  of Birth/Sex: Treating RN: 06-05-1945 (77 y.o. Corey [name] Primary Care Lanaya Bennis: Billey Gosling Other Clinician: Referring Alexios Keown: Treating Rodnesha Elie/Extender: Mickle Asper in Treatment: 25 Active Inactive Necrotic Tissue Nursing Diagnoses: Knowledge deficit related to management of necrotic/devitalized tissue Goals: Necrotic/devitalized tissue will be minimized in the wound bed Date Initiated: 11/01/2021 Target Resolution Date: 07/14/2022 Corey [name], Corey [name] ([id number]) [phone].pdf Page 5 of 10 Goal Status: Active Patient/caregiver will verbalize understanding of reason and process for debridement  of necrotic tissue Date Initiated: 11/01/2021 Date Inactivated: 12/05/2021 Target Resolution Date: 12/09/2021 Goal Status: Met Interventions: Assess patient pain level pre-, during and post procedure and prior to discharge Provide education on necrotic tissue and debridement process Treatment Activities: Apply topical anesthetic as ordered : 11/01/2021 Enzymatic debridement : 11/01/2021 Excisional debridement : 11/01/2021 Notes: Pain, Acute or Chronic Nursing Diagnoses: Pain, acute or chronic: actual or potential Potential alteration in comfort, pain Goals: Patient will verbalize adequate pain control and receive pain control interventions during procedures as needed Date Initiated: 11/01/2021 Date Inactivated: 11/21/2021 Target Resolution Date: 12/09/2021 Goal Status: Met Patient/caregiver will verbalize comfort level met Date Initiated: 11/01/2021 Target Resolution Date: 07/14/2022 Goal Status: Active Interventions: Complete pain assessment as per visit requirements Provide education on pain management Treatment Activities: Administer pain control measures as ordered : 11/01/2021 Notes: Electronic Signature(s) Signed: 06/22/2022 5:19:15 PM By: Sharyn Creamer RN, BSN Entered By: Sharyn Creamer on 06/19/2022 09:41:50 -------------------------------------------------------------------------------- Pain Assessment Details Patient Name: Date of Service: Corey [name], [name] 06/19/2022 9:30 A M Medical Record Number: [id number] Patient Account Number: [id number] Date of Birth/Sex: Treating RN: [birth year] (77 y.o. Corey [name] Primary Care Timiyah Romito: Billey Gosling Other Clinician: Referring Thekla Colborn: Treating Harvis Mabus/Extender: Mickle Asper in Treatment: 92 Active Problems Location of Pain Severity and Description of Pain Patient Has Paino No Site Locations Corey [name], Corey [name] [place] ([id number]) [id number] Page 6 of 10 Pain Management and  Medication Current Pain Management: Electronic Signature(s) Signed: 06/22/2022 5:19:15 PM By: Sharyn Creamer RN, BSN Entered By: Sharyn Creamer on 06/19/2022 09:24:06 -------------------------------------------------------------------------------- Patient/Caregiver Education Details Patient Name: Date of Service: Corey [name] 2/5/2024andnbsp9:30 A M Medical Record Number: [id number] Patient Account Number: [id number] Date of Birth/Gender: Treating RN: Jan 30, 1946 (77 y.o. Corey [name] Primary Care Physician: Billey Gosling Other Clinician: Referring Physician: Treating Physician/Extender: Mickle Asper in Treatment: 32 Education Assessment Education Provided To: Patient Education Topics Provided Wound/Skin Impairment: Methods: Explain/Verbal Responses: State content correctly [employer]) Signed: 06/22/2022 5:19:15 PM By: Sharyn Creamer RN, BSN Entered By: Sharyn Creamer on 06/19/2022 09:42:25 -------------------------------------------------------------------------------- Wound Assessment Details Patient Name: Date of Service: Corey [name], [name] 06/19/2022 9:30 A M Medical Record Number: [id number] Patient Account Number: [id number] Date of Birth/Sex: Treating RN: 03/12/1946 (77 y.o. Corey [name] Primary Care Leda [place]: Billey Gosling Other Clinician: Referring Remberto Lienhard: Treating Takyra Cantrall/Extender: Corey [name], Corey [name] ([id number]) [id number] Page 7 of 10 Weeks in Treatment: 32 Wound Status Wound Number: 1 Primary Diabetic Wound/Ulcer of the Lower Extremity Etiology: Wound Location: Right, Lateral Lower Leg Wound Open Wounding Event: Gradually Appeared Status: Date Acquired: 08/15/2021 Comorbid Cataracts, Sleep Apnea, Congestive Heart Failure, Hypertension, Weeks Of Treatment: 32 History: Myocardial Infarction, Type II Diabetes, Gout Clustered Wound: No Photos Wound  Measurements Length: (cm) 0.3 Width: (cm) 0.2 Depth: (cm) 0.1 Area: (cm) 0.047 Volume: (cm) 0.005 % Reduction in Area: 99.8% % Reduction in Volume: 99.8% Epithelialization: Large (67-100%) Tunneling: No Undermining: No Wound Description Classification: Grade  2 Wound Margin: Distinct, outline attached Exudate Amount: Medium Exudate Type: Serosanguineous Exudate Color: red, brown Foul Odor After Cleansing: No Slough/Fibrino Yes Wound Bed Granulation Amount: Large (67-100%) Exposed Structure Granulation Quality: Red, Pink Fascia Exposed: No Necrotic Amount: None Present (0%) Fat Layer (Subcutaneous Tissue) Exposed: Yes Tendon Exposed: No Muscle Exposed: No Joint Exposed: No Bone Exposed: No Periwound Skin Texture Texture Color No Abnormalities Noted: No No Abnormalities Noted: No Callus: No Atrophie Blanche: No Crepitus: No Cyanosis: No Excoriation: No Ecchymosis: No Induration: No Erythema: No Rash: No Hemosiderin Staining: No Scarring: Yes Mottled: No Pallor: No Moisture Rubor: No No Abnormalities Noted: No Dry / Scaly: No Temperature / Pain Maceration: No Temperature: No Abnormality Tenderness on Palpation: Yes Treatment Notes Wound #1 (Lower Leg) Wound Laterality: Right, Lateral Cleanser Soap and Water Discharge Instruction: May shower and wash wound with dial antibacterial soap and water prior to dressing change. Wound Cleanser Discharge Instruction: Cleanse the wound with wound cleanser prior to applying a clean dressing using gauze sponges, not tissue or cotton balls. Peri-Wound Care Corey [name], Corey [name] ([id number]) [id number] Page 8 of 10 Topical Primary Dressing Endoform 2x2 in Discharge Instruction: Moisten with saline Secondary Dressing ABD Pad, 8x10 Discharge Instruction: Apply over primary dressing as directed. Woven Gauze Sponge, Non-Sterile 4x4 in Discharge Instruction: Apply over primary dressing as  directed. Secured With Compression Wrap ThreePress (3 layer compression wrap) Discharge Instruction: Apply three layer compression as directed. Compression Stockings Add-Ons Electronic Signature(s) Signed: 06/22/2022 5:19:15 PM By: Sharyn Creamer RN, BSN Entered By: Sharyn Creamer on 06/19/2022 09:34:47 -------------------------------------------------------------------------------- Wound Assessment Details Patient Name: Date of Service: Corey [name], [name] 06/19/2022 9:30 A M Medical Record Number: [id number] Patient Account Number: [id number] Date of Birth/Sex: Treating RN: 1945/12/05 (77 y.o. Corey [name] Primary Care Rivaldo Hineman: Billey Gosling Other Clinician: Referring Jood Retana: Treating Markiah Janeway/Extender: [name] in Treatment: 32 Wound Status Wound Number: 2 Primary Diabetic Wound/Ulcer of the Lower Extremity Etiology: Wound Location: Left, Anterior Lower Leg Wound Open Wounding Event: Gradually Appeared Status: Date Acquired: 06/19/2022 Comorbid Cataracts, Sleep Apnea, Congestive Heart Failure, Hypertension, Weeks Of Treatment: 0 History: Myocardial Infarction, Type II Diabetes, Gout Clustered Wound: No Photos Wound Measurements Length: (cm) 0.4 Width: (cm) 0.7 Depth: (cm) 0.1 Area: (cm) 0.22 Volume: (cm) 0.022 % Reduction in Area: % Reduction in Volume: Epithelialization: None Tunneling: No Undermining: No Wound Description Classification: Grade 2 Corey [name], Corey [name] ([id number]) Wound Margin: Distinct, outline attached Exudate Amount: Medium Exudate Type: Serosanguineous Exudate Color: red, brown Foul Odor After Cleansing: No [id number] Page 9 of 10 Slough/Fibrino Yes Wound Bed Granulation Amount: Large (67-100%) Exposed Structure Granulation Quality: Red Fascia Exposed: No Necrotic Amount: Small (1-33%) Fat Layer (Subcutaneous Tissue) Exposed: Yes Necrotic Quality: Adherent Slough Tendon Exposed:  No Muscle Exposed: No Joint Exposed: No Bone Exposed: No Periwound Skin Texture Texture Color No Abnormalities Noted: No No Abnormalities Noted: No Moisture No Abnormalities Noted: No Treatment Notes Wound #2 (Lower Leg) Wound Laterality: Left, Anterior Cleanser Soap and Water Discharge Instruction: May shower and wash wound with dial antibacterial soap and water prior to dressing change. Wound Cleanser Discharge Instruction: Cleanse the wound with wound cleanser prior to applying a clean dressing using gauze sponges, not tissue or cotton balls. Peri-Wound Care Topical Primary Dressing Endoform 2x2 in Discharge Instruction: Moisten with saline Secondary Dressing ABD Pad, 8x10 Discharge Instruction: Apply over primary dressing as directed. Woven Gauze Sponge, Non-Sterile 4x4 in Discharge Instruction: Apply over primary dressing as directed. Secured With  Compression Wrap ThreePress (3 layer compression wrap) Discharge Instruction: Apply three layer compression as directed. Compression Stockings Add-Ons Electronic Signature(s) Signed: 06/22/2022 5:19:15 PM By: Sharyn Creamer RN, BSN Entered By: Sharyn Creamer on 06/19/2022 09:36:09 -------------------------------------------------------------------------------- [place] Details Patient Name: Date of Service: Corey [name], [name] 06/19/2022 9:30 A M Medical Record Number: [id number] Patient Account Number: [id number] Date of Birth/Sex: Treating RN: 02/01/1946 (77 y.o. Corey [name] Primary Care Allessandra Bernardi: Billey Gosling Other Clinician: Referring Tavaria Mackins: Treating Dondra Rhett/Extender: Mickle Asper in Treatment: [address] [place] ([id number]) [id number] Page 10 of 10 Vital Signs Time Taken: 09:22 Temperature (F): 97.8 Height (in): 66 Pulse (bpm): 71 Weight (lbs): 145 Respiratory Rate (breaths/min): 18 Body Mass Index (BMI): 23.4 Blood Pressure (mmHg): 141/82 Reference Range:  80 - 120 mg / dl Electronic Signature(s) Signed: 06/22/2022 5:19:15 PM By: Sharyn Creamer RN, BSN Entered By: Sharyn Creamer on 06/19/2022 09:22:38

## 2022-06-23 NOTE — CV Procedure (Signed)
   TRANSESOPHAGEAL ECHOCARDIOGRAM GUIDED DIRECT CURRENT CARDIOVERSION  NAME:  Corey [name]    MRN: [id number] DOB:  [birth year]    ADMIT DATE: 06/23/2022  INDICATIONS: Symptomatic atrial fibrillation/flutter  Pre-op for AFL/AF ablation  PROCEDURE:   Informed consent was obtained prior to the procedure. The risks, benefits and alternatives for the procedure were discussed and the patient comprehended these risks.  Risks include, but are not limited to, cough, sore throat, vomiting, nausea, somnolence, esophageal and stomach trauma or perforation, bleeding, low blood pressure, aspiration, pneumonia, infection, trauma to the teeth and death.    After a procedural time-out, the oropharynx was anesthetized and the patient was sedated by the anesthesia service. The transesophageal probe was inserted in the esophagus and stomach without difficulty and multiple views were obtained. Sedation by anesthesia.   COMPLICATIONS:    Complications: No complications Patient tolerated procedure well.  KEY FINDINGS:  LV & RV function moderately reduced with LVH consistent with amyloidosis.  No left atrial appendage thrombus.   Full report to follow.  Simon Aaberg Advanced Heart Failure 4:56 PM

## 2022-06-23 NOTE — Telephone Encounter (Signed)
-----   Message from [name], MD sent at 06/18/2022  6:49 PM EST ----- Regarding: FW: Pain and sleep issues He needs to be seen if he needs medication or something different  ----- Message ----- From: Kandis Mannan, RN Sent: 06/14/2022   5:16 PM EST To: [name], MD Subject: Pain and sleep issues                          Good afternoon!   I am Karena Addison, the new Palliative Care Nurse with [facility]. I spoke to Mr. Vonbargen on the phone on 1.30.24 in the afternoon. He asked that I contact you to let you know that he is experiencing low back pain that is not fully relieved with Tylenol. He reports it is effective for 1-2hrs at best. He would also like for you to know that he has been having difficulty sleeping at night but takes little naps during the daytime. He feels tired and would like to sleep at night.  Can you please contact him regarding the pain and sleep issues.  Thank you,  Lorelee Market, [place] Nurse [phone]

## 2022-06-23 NOTE — Anesthesia Preprocedure Evaluation (Signed)
Anesthesia Evaluation  Patient identified by MRN, date of birth, ID band Patient awake    Reviewed: Allergy & Precautions, NPO status , Patient's Chart, lab work & pertinent test results  Airway Mallampati: I       Dental  (+) Edentulous Upper,    Pulmonary shortness of breath, sleep apnea , former smoker   Pulmonary exam normal        Cardiovascular hypertension, Pt. on medications + CAD, + Past MI and +CHF  + dysrhythmias Atrial Fibrillation  Rhythm:Irregular Rate:Normal     Neuro/Psych CVA, No Residual Symptoms  negative psych ROS   GI/Hepatic negative GI ROS, Neg liver ROS,,,  Endo/Other  diabetes    Renal/GU Renal InsufficiencyRenal diseaseCKD IV  negative genitourinary   Musculoskeletal   Abdominal Normal abdominal exam  (+)   Peds  Hematology  (+) Blood dyscrasia, anemia   Anesthesia Other Findings   1. Speckled appearance to LV myocardium consistent with diagnosis of  amyloid Global hypokinesis worse in the posterior lateral wall . Left  ventricular ejection fraction, by estimation, is 30 to 35%. The left  ventricle has moderately decreased function.  The left ventricle demonstrates global hypokinesis. There is severe left  ventricular hypertrophy. Left ventricular diastolic parameters are  indeterminate. Elevated left ventricular end-diastolic pressure.   2. Right ventricular systolic function is normal. The right ventricular  size is normal. There is moderately elevated pulmonary artery systolic  pressure.   3. Left atrial size was moderately dilated.   4. Right atrial size was moderately dilated.   5. The mitral valve is normal in structure. Mild mitral valve  regurgitation. No evidence of mitral stenosis.   6. The aortic valve is tricuspid. There is mild calcification of the  aortic valve. There is mild thickening of the aortic valve. Aortic valve  regurgitation is not visualized. Aortic valve  sclerosis is present, with  no evidence of aortic valve stenosis.   7. Aortic dilatation noted. There is moderate dilatation of the ascending  aorta, measuring 41 mm.   8. The inferior vena cava is dilated in size with <50% respiratory  variability, suggesting right atrial pressure of 15 mmHg.    SUMMARY  Severe single-vessel disease with heavily calcified mid LAD 80% stenosis  Acute Combined Systolic and Diastolic Heart Failure (systolic function appears to be reduced, to conserve contrast, LV gram not performed), but LVEDP 32 mmHg and PCWP 28 mmHg.  Systemic Hypertension with Mean Arterial Pressure 123 mmHg  Secondary Pulmonary Hypertension: PAP 67/19 mmHg-mean 39 mmHg with RVP-EDP of 67/2 mmHg-15 mmHg, RVP and RAP of 14 mmHg.  Moderate to severely reduced Cardiac Output-Index: 4.04-2.09     Reproductive/Obstetrics                             Anesthesia Physical Anesthesia Plan  ASA: 3  Anesthesia Plan:    Post-op Pain Management: Minimal or no pain anticipated   Induction: Intravenous  PONV Risk Score and Plan: 2 and Treatment may vary due to age or medical condition, Ondansetron and Midazolam  Airway Management Planned: Oral ETT  Additional Equipment:   Intra-op Plan:   Post-operative Plan: Extubation in OR  Informed Consent: I have reviewed the patients History and Physical, chart, labs and discussed the procedure including the risks, benefits and alternatives for the proposed anesthesia with the patient or authorized representative who has indicated his/her understanding and acceptance.       Plan Discussed  with: CRNA  Anesthesia Plan Comments:        Anesthesia Quick Evaluation

## 2022-06-23 NOTE — Discharge Instructions (Signed)
Post procedure care instructions No driving for 4 days. No lifting over 5 lbs for 1 week. No vigorous or sexual activity for 1 week. You may return to work/your usual activities on 07/01/22. Keep procedure site clean & dry. If you notice increased pain, swelling, bleeding or pus, call/return!  You may shower after 24 hours, but no soaking in baths/hot tubs/pools for 1 week.

## 2022-06-23 NOTE — Progress Notes (Signed)
Corey, [name] ([id number]) [id number] Page 1 of 11 Visit Report for 06/19/2022 Chief Complaint Document Details Patient Name: Date of Service: Corey [name]. 06/19/2022 9:30 A M Medical Record Number: [id number] Patient Account Number: [id number] Date of Birth/Sex: Treating RN: 1945/07/08 (77 y.o. M) Primary Care Provider: Billey Gosling Other Clinician: Referring Provider: Treating Provider/Extender: Mickle Asper in Treatment: 28 Information Obtained from: Patient Chief Complaint 11/01/2021; right lower extremity wound Electronic Signature(s) Signed: 06/19/2022 1:21:38 PM By: Kalman Shan DO Entered By: Kalman Shan on 06/19/2022 09:46:03 -------------------------------------------------------------------------------- Debridement Details Patient Name: Date of Service: [name], [name] 06/19/2022 9:30 A M Medical Record Number: [id number] Patient Account Number: [id number] Date of Birth/Sex: Treating RN: 05-03-1946 (77 y.o. Mare Ferrari Primary Care Provider: Billey Gosling Other Clinician: Referring Provider: Treating Provider/Extender: Mickle Asper in Treatment: 32 Debridement Performed for Assessment: Wound #1 Right,Lateral Lower Leg Performed By: Physician Kalman Shan, DO Debridement Type: Debridement Severity of Tissue Pre Debridement: Fat layer exposed Level of Consciousness (Pre-procedure): Awake and Alert Pre-procedure Verification/Time Out Yes - 09:42 Taken: Start Time: 09:42 Pain Control: Lidocaine 4% T opical Solution T Area Debrided (L x W): otal 0.3 (cm) x 0.2 (cm) = 0.06 (cm) Tissue and other material debrided: Non-Viable, Slough, Slough Level: Non-Viable Tissue Debridement Description: Selective/Open Wound Instrument: Curette Bleeding: Minimum Hemostasis Achieved: Pressure Procedural Pain: 0 Post Procedural Pain: 0 Response to Treatment: Procedure was tolerated well Level  of Consciousness (Post- Awake and Alert procedure): Post Debridement Measurements of Total Wound Length: (cm) 0.3 Width: (cm) 0.2 Depth: (cm) 0.1 Volume: (cm) 0.005 Character of Wound/Ulcer Post Debridement: Improved Severity of Tissue Post Debridement: Fat layer exposed Corey, [name] ([id number]) [id number] Page 2 of 11 Post Procedure Diagnosis Same as Pre-procedure Notes Scribed for Dr Heber [place] by Sharyn Creamer, RN Electronic Signature(s) Signed: 06/19/2022 1:21:38 PM By: Kalman Shan DO Signed: 06/22/2022 5:19:15 PM By: Sharyn Creamer RN, BSN Entered By: Sharyn Creamer on 06/19/2022 09:43:51 -------------------------------------------------------------------------------- HPI Details Patient Name: Date of Service: [name], [name] 06/19/2022 9:30 A M Medical Record Number: [id number] Patient Account Number: [id number] Date of Birth/Sex: Treating RN: 30-Dec-1945 (77 y.o. M) Primary Care Provider: Billey Gosling Other Clinician: Referring Provider: Treating Provider/Extender: Mickle Asper in Treatment: 30 History of Present Illness HPI Description: Admission 11/01/2021 [name] Is a 77 year old male with a past medical history of of type 2 diabetes, coronary artery disease, stage IV kidney disease acute on chronic combined systolic and diastolic heart failure that presents to the clinic for a 11-monthhistory of right lower extremity wound. He states he is not sure how this started. It is completely eschared. He reports his wound has been stable for the past month. He has been keeping the area covered. He has mild chronic pain. He denies signs of infection. He states he has a history of lower extremity ulcers last being on the left leg. He could not tell me how this was treated in the past. 6/27; patient presents for follow-up. He is scheduled for his lower extremity arterial studies on July 6th. He has been using Medihoney to the  wound bed. He has no issues or complaints today. He denies signs of infection. 7/6; patient presents for follow-up. He has home health that reported the wound having odor and increased pain last week. He was sent to urgent care. He was started on doxycycline. He is continuing Medihoney. He has his scheduled  ABIs w/TBIs today. 7/10; patient presents for follow-up. He had his arterial studies done last week. He had triphasic waveforms throughout the right lower extremity. His TBI was 0.64. He has good pedal pulses. He is almost done with his course of antibiotics. He has been using mupirocin ointment to the wound bed. 7/17; patient presents for follow-up. He has been using Iodosorb under compression therapy. He has no issues or complaints today. He states he is receiving Keystone antibiotic tomorrow In the mail. 7/24; patient presents for follow-up. He has been doing Dakin's wet-to-dry dressings to the wound bed daily without issues. He currently denies signs of infection. He reports completing his course of doxycycline. He has his Keystone antibiotic with him today. 7/31; patient presents for follow-up. We have been using Iodoflex with [facility] under compression therapy. He has no issues or complaints today. He denies signs of infection. 8/7; patient presents for follow-up. We have been using Iodoflex with [facility] under compression therapy. Home health came out once last week to change the wrap. He has no issues or complaints today. He denies signs of infection. 8/14; patient presents for follow-up. We have been using Keystone with Hydrofera Blue under compression therapy. He has tolerated this well. He has no issues or complaints today. 8/21; patient presents for follow-up. We have been using Keystone with Hydrofera Blue under compression therapy. The Hydrofera Blue was stuck on tightly to the wound bed today. Other than that he has no issues or complaints. We have not heard back for insurance  approval for skin sub. 8/28; patient presents for follow-up. We ordered blast X and collagen powder at last clinic visit. It is unclear if he has received this. We placed Keystone with Fibracol under 3 layer compression last week. Patient has home health. Patient has no issues or complaints today. He was not approved for TheraSkin. 9/5; patient presents for follow-up. Insurance would not cover blast X. We do have a sample and he would like to try this. We have been using Keystone antibiotic and collagen under 3 layer compression. He has no issues or complaints today. 9/18; patient presents for follow-up. We have been using collagen with blast X and Keystone under compression therapy. He has no issues or complaints today. 9/25; patient presents for follow-up. We have continued to use collagen, blast X and Keystone under compression therapy. Patient has no issues or complaints today. 10/6; patient presents for follow-up. We have been using collagen, blast X and Keystone under compression therapy. He denies signs of infection and has no issues or complaints today. Patient has home health that comes out on Wednesdays and Fridays. 10/16; patient presents for follow-up. He has been using collagen, blast X and Keystone with benefit to the wound bed under compression therapy. He is about to run out of [facility] antibiotic ointment. He has no issues or complaints today. DAJOUR, BUFFONE ([id number]) [id number] Page 3 of 11 10/23; patient presents for follow-up. We continue to use collagen, blast X and Keystone under compression therapy. Continued report in wound healing. Home health continues to come out to do dressing changes. Patient has no issues or complaints today. 10/30; patient presents for follow-up. We have been using collagen, blast X and Keystone antibiotic under compression therapy. His wound continues to show improvement in healing. He has no issues or complaints  today. 11/30; patient was last seen 1 month ago. He has missed his last clinic visit. He has home health who changes the dressings 3 times weekly. He has  been using collagen with Keystone antibiotic under 3 layer compression. He has run out of [place] antibiotic ointment. The wound is almost healed. 12/18; patient was last seen 3 weeks ago. We have been using collagen with Keystone antibiotic under 3 layer compression. Home health has been coming out twice a week. He has no issues or complaints today. 1/4; patient presents for follow-up. We have been using collagen under 3 layer compression to the right lower extremity. Home health has been coming out twice weekly to change the dressing. He states the wrap slid down this past week. 1/18; patient presents for follow-up. We have been using Hydrofera Blue under 3 layer compression to the right lower extremity. Home health is discharging the patient. He has no issues or complaints today. 1/26; patient presents for follow-up. We have been using Hydrofera Blue under 3 layer compression to the right lower extremity. He states his diuretic was increased. He has no issues or complaints today. 2/5; patient presents for follow-up. We have been using endoform under 3 layer compression to the right lower extremity. Unfortunately has developed a new wound to the left lower extremity. This started spontaneously over the past week. He was recently started on a new diuretic. He denies signs of infection. Electronic Signature(s) Signed: 06/19/2022 1:21:38 PM By: Kalman Shan DO Entered By: Kalman Shan on 06/19/2022 09:49:16 -------------------------------------------------------------------------------- Physical Exam Details Patient Name: Date of Service: [name], [name] 06/19/2022 9:30 A M Medical Record Number: [id number] Patient Account Number: [id number] Date of Birth/Sex: Treating RN: 1945-08-17 (77 y.o. M) Primary Care Provider: Billey Gosling Other  Clinician: Referring Provider: Treating Provider/Extender: Mickle Asper in Treatment: 75 Constitutional respirations regular, non-labored and within target range for patient.. Cardiovascular 2+ dorsalis pedis/posterior tibialis pulses. Psychiatric pleasant and cooperative. Notes Right lower extremity: T the anterior aspect there is an open wound with granulation tissue and non viable tissue. No surrounding signs of infection. 2+ pitting o edema to the knee. Left lower extremity: T the anterior aspect there is an open wound with granulation tissue throughout. No surrounding signs of infection. 2+ pitting edema to the o knee. Electronic Signature(s) Signed: 06/19/2022 1:21:38 PM By: Kalman Shan DO Entered By: Kalman Shan on 06/19/2022 09:50:05 -------------------------------------------------------------------------------- Physician Orders Details Patient Name: Date of Service: [name], [name] 06/19/2022 9:30 A M Medical Record Number: [id number] Patient Account Number: [id number] Date of Birth/Sex: Treating RN: 06-25-1945 (77 y.o. Mare Ferrari Primary Care Provider: Billey Gosling Other Clinician: JAEGER, Corey ([id number]) [id number] Page 4 of 11 Referring Provider: Treating Provider/Extender: Mickle Asper in Treatment: 46 Verbal / Phone Orders: No Diagnosis Coding Follow-up Appointments ppointment in 1 week. - Dr. Heber [place] Room 9 Monday 06/26/22 @1$ :15 Return A Other: - Speak with cardiology about fluid build up noted in legs. Anesthetic (In clinic) Topical Lidocaine 5% applied to wound bed (In clinic) Topical Lidocaine 4% applied to wound bed Cellular or Tissue Based Products Cellular or Tissue Based Product Type: - run insurance for [employer], epicord, and organogenesis advance tissue product. Theraskin not approved. Epicord not covered. Organ[employer] Apligraf approved. Edema Control -  Lymphedema / SCD / Other Avoid standing for long periods of time. Patient to wear own compression stockings every day. - We will apply a tubi grip size E double folded today [place] home [facility] for wound care. - [place] Wound Treatment Wound #1 - Lower Leg Wound Laterality: Right, Lateral Cleanser: Soap and Water (Home  Health) 1 x Per Week/30 Days Discharge Instructions: May shower and wash wound with dial antibacterial soap and water prior to dressing change. Cleanser: Wound Cleanser (Home Health) 1 x Per Week/30 Days Discharge Instructions: Cleanse the wound with wound cleanser prior to applying a clean dressing using gauze sponges, not tissue or cotton balls. Prim Dressing: Endoform 2x2 in 1 x Per Week/30 Days ary Discharge Instructions: Moisten with saline Secondary Dressing: ABD Pad, 8x10 1 x Per Week/30 Days Discharge Instructions: Apply over primary dressing as directed. Secondary Dressing: Woven Gauze Sponge, Non-Sterile 4x4 in (Home Health) 1 x Per Week/30 Days Discharge Instructions: Apply over primary dressing as directed. Compression Wrap: ThreePress (3 layer compression wrap) (Home Health) 1 x Per Week/30 Days Discharge Instructions: Apply three layer compression as directed. Wound #2 - Lower Leg Wound Laterality: Left, Anterior Cleanser: Soap and Water (Home Health) 1 x Per Week/30 Days Discharge Instructions: May shower and wash wound with dial antibacterial soap and water prior to dressing change. Cleanser: Wound Cleanser (Home Health) 1 x Per Week/30 Days Discharge Instructions: Cleanse the wound with wound cleanser prior to applying a clean dressing using gauze sponges, not tissue or cotton balls. Prim Dressing: Endoform 2x2 in 1 x Per Week/30 Days ary Discharge Instructions: Moisten with saline Secondary Dressing: ABD Pad, 8x10 1 x Per Week/30 Days Discharge Instructions: Apply over primary dressing as directed. Secondary Dressing: Woven  Gauze Sponge, Non-Sterile 4x4 in (Home Health) 1 x Per Week/30 Days Discharge Instructions: Apply over primary dressing as directed. Compression Wrap: ThreePress (3 layer compression wrap) (Home Health) 1 x Per Week/30 Days Discharge Instructions: Apply three layer compression as directed. Patient Medications llergies: hydrochlorothiazide A Notifications Medication Indication Start End prior to debridement 06/19/2022 lidocaine DOSE topical 4 % cream - cream topical once daily Corey, [name] ([id number]) [id number] Page 5 of 11 Electronic Signature(s) Signed: 06/19/2022 1:21:38 PM By: Kalman Shan DO Entered By: Kalman Shan on 06/19/2022 09:50:24 -------------------------------------------------------------------------------- Problem List Details Patient Name: Date of Service: [name], [name] 06/19/2022 9:30 A M Medical Record Number: [id number] Patient Account Number: [id number] Date of Birth/Sex: Treating RN: 04-09-1946 (77 y.o. M) Primary Care Provider: Billey Gosling Other Clinician: Referring Provider: Treating Provider/Extender: Mickle Asper in Treatment: 71 Active Problems ICD-10 Encounter Code Description Active Date MDM Diagnosis L97.918 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 with other 11/01/2021 No Yes specified severity E11.622 Type 2 diabetes mellitus with other skin ulcer 11/01/2021 No Yes I50.42 Chronic combined systolic (congestive) and diastolic (congestive) heart failure 11/01/2021 No Yes I25.119 Atherosclerotic heart disease of native coronary artery with unspecified angina 11/01/2021 No Yes pectoris I87.311 Chronic venous hypertension (idiopathic) with ulcer of right lower extremity 12/26/2021 No Yes I87.312 Chronic venous hypertension (idiopathic) with ulcer of left lower extremity 06/19/2022 No Yes S81.802A Unspecified open wound, left lower leg, initial encounter 06/19/2022 No  Yes Inactive Problems Resolved Problems Electronic Signature(s) Signed: 06/19/2022 1:21:38 PM By: Kalman Shan DO Entered By: Kalman Shan on 06/19/2022 09:45:48 Progress Note Details -------------------------------------------------------------------------------- [name] ([id number]) [id number] Page 6 of 11 Patient Name: Date of Service: Corey [name]. 06/19/2022 9:30 A M Medical Record Number: [id number] Patient Account Number: [id number] Date of Birth/Sex: Treating RN: [birth year] (77 y.o. M) Primary Care Provider: Billey Gosling Other Clinician: Referring Provider: Treating Provider/Extender: Mickle Asper in Treatment: 55 Subjective Chief Complaint Information obtained from Patient 11/01/2021; right lower extremity wound History of Present Illness (HPI)  Admission 11/01/2021 [name] Is a 77 year old male with a past medical history of of type 2 diabetes, coronary artery disease, stage IV kidney disease acute on chronic combined systolic and diastolic heart failure that presents to the clinic for a 81-monthhistory of right lower extremity wound. He states he is not sure how this started. It is completely eschared. He reports his wound has been stable for the past month. He has been keeping the area covered. He has mild chronic pain. He denies signs of infection. He states he has a history of lower extremity ulcers last being on the left leg. He could not tell me how this was treated in the past. 6/27; patient presents for follow-up. He is scheduled for his lower extremity arterial studies on July 6th. He has been using Medihoney to the wound bed. He has no issues or complaints today. He denies signs of infection. 7/6; patient presents for follow-up. He has home health that reported the wound having odor and increased pain last week. He was sent to urgent care. He was started on doxycycline. He is continuing Medihoney.  He has his scheduled ABIs w/TBIs today. 7/10; patient presents for follow-up. He had his arterial studies done last week. He had triphasic waveforms throughout the right lower extremity. His TBI was 0.64. He has good pedal pulses. He is almost done with his course of antibiotics. He has been using mupirocin ointment to the wound bed. 7/17; patient presents for follow-up. He has been using Iodosorb under compression therapy. He has no issues or complaints today. He states he is receiving Keystone antibiotic tomorrow In the mail. 7/24; patient presents for follow-up. He has been doing Dakin's wet-to-dry dressings to the wound bed daily without issues. He currently denies signs of infection. He reports completing his course of doxycycline. He has his Keystone antibiotic with him today. 7/31; patient presents for follow-up. We have [facility]. He has no issues or complaints today. He denies signs of infection. 8/7; patient presents for follow-up. We [facility] Home health came out once last week to change the wrap. He has no issues or complaints today. He denies signs of infection. 8/14; patient presents for follow-up. We have been using Keystone with Hydrofera Blue under compression therapy. He has tolerated this well. He has no issues or complaints today. 8/21; patient presents for follow-up. We have been using Keystone with Hydrofera Blue under compression therapy. The Hydrofera Blue was stuck on tightly to the wound bed today. Other than that he has no issues or complaints. We have not heard back for insurance approval for skin sub. 8/28; patient presents for follow-up. We ordered blast X and collagen powder at last clinic visit. It is unclear if he has received this. We placed Keystone with Fibracol under 3 layer compression last week. Patient has home health. Patient has no issues or complaints  today. He was not approved for TheraSkin. 9/5; patient presents for follow-up. Insurance would not cover blast X. We do have a sample and he would like to try this. We have been using Keystone antibiotic and collagen under 3 layer compression. He has no issues or complaints today. 9/18; patient presents for follow-up. We have been using collagen with blast X and Keystone under compression therapy. He has no issues or complaints today. 9/25; patient presents for follow-up. We have continued to use collagen, blast X and Keystone under compression therapy. Patient  has no issues or complaints today. 10/6; patient presents for follow-up. We have been using collagen, blast X and Keystone under compression therapy. He denies signs of infection and has no issues or complaints today. Patient has home health that comes out on Wednesdays and Fridays. 10/16; patient presents for follow-up. He has been using collagen, blast X and Keystone with benefit to the wound bed under compression therapy. He is about to run out of [facility] antibiotic ointment. He has no issues or complaints today. 10/23; patient presents for follow-up. We continue to use collagen, blast X and Keystone under compression therapy. Continued report in wound healing. Home health continues to come out to do dressing changes. Patient has no issues or complaints today. 10/30; patient presents for follow-up. We have been using collagen, blast X and Keystone antibiotic under compression therapy. His wound continues to show improvement in healing. He has no issues or complaints today. 11/30; patient was last seen 1 month ago. He has missed his last clinic visit. He has home health who changes the dressings 3 times weekly. He has been using collagen with Keystone antibiotic under 3 layer compression. He has run out of [place] antibiotic ointment. The wound is almost healed. 12/18; patient was last seen 3 weeks ago. We have been using collagen with  Keystone antibiotic under 3 layer compression. Home health has been coming out twice a week. He has no issues or complaints today. 1/4; patient presents for follow-up. We have been using collagen under 3 layer compression to the right lower extremity. Home health has been coming out twice weekly to change the dressing. He states the wrap slid down this past week. 1/18; patient presents for follow-up. We have been using Hydrofera Blue under 3 layer compression to the right lower extremity. Home health is discharging the patient. He has no issues or complaints today. 1/26; patient presents for follow-up. We have been using Hydrofera Blue under 3 layer compression to the right lower extremity. He states his diuretic was increased. He has no issues or complaints today. 2/5; patient presents for follow-up. We have been using endoform under 3 layer compression to the right lower extremity. Unfortunately has developed a new Corey, [name] ([id number]) [id number] Page 7 of 11 wound to the left lower extremity. This started spontaneously over the past week. He was recently started on a new diuretic. He denies signs of infection. Patient History Family History Cancer - Father, Diabetes - Mother,Father, Heart Disease - Mother,Siblings, Hypertension - Mother,Father. Social History Former smoker - quit 1970, Marital Status - Married, Drug Use - No History, Caffeine Use - Moderate - coffee. Medical History Eyes Patient has history of Cataracts Respiratory Patient has history of Sleep Apnea - has CPAP does not wear Cardiovascular Patient has history of Congestive Heart Failure, Hypertension, Myocardial Infarction - 2014 Endocrine Patient has history of Type II Diabetes Musculoskeletal Patient has history of Gout Neurologic Denies history of Seizure Disorder Hospitalization/Surgery History - 06/2021 inguinal hernia repair. - cardioversion 09/2020. - ventral hernia repair  3/19. 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 Vitals Time Taken: 9:22 AM, Height: 66 in, Weight: 145 lbs, BMI: 23.4, Temperature: 97.8 F, Pulse: 71 bpm, Respiratory Rate: 18 breaths/min, Blood Pressure: 141/82 mmHg. Cardiovascular 2+ dorsalis pedis/posterior tibialis pulses. Psychiatric pleasant and cooperative. General Notes: Right lower extremity: T the anterior  aspect there is an open wound with granulation tissue and non viable tissue. No surrounding signs of o infection. 2+ pitting edema to the knee. Left lower extremity: T the anterior aspect there is an open wound with granulation tissue throughout. No surrounding o signs of infection. 2+ pitting edema to the knee. Integumentary (Hair, Skin) Wound #1 status is Open. Original cause of wound was Gradually Appeared. The date acquired was: 08/15/2021. The wound has been in treatment 32 weeks. The wound is located on the Right,Lateral Lower Leg. The wound measures 0.3cm length x 0.2cm width x 0.1cm depth; 0.047cm^2 area and 0.005cm^3 volume. There is Fat Layer (Subcutaneous Tissue) exposed. There is no tunneling or undermining noted. There is a medium amount of serosanguineous drainage noted. The wound margin is distinct with the outline attached to the wound base. There is large (67-100%) red, pink granulation within the wound bed. There is no necrotic tissue within the wound bed. The periwound skin appearance exhibited: Scarring. The periwound skin appearance did not exhibit: Callus, Crepitus, Excoriation, Induration, Rash, Dry/Scaly, Maceration, Atrophie Blanche, Cyanosis, Ecchymosis, Hemosiderin Staining, Mottled, Pallor, Rubor, Erythema. Periwound temperature was noted as No  Abnormality. The periwound has tenderness on palpation. Wound #2 status is Open. Original cause of wound was Gradually Appeared. The date acquired was: 06/19/2022. The wound is located on the Left,Anterior Lower Leg. The wound measures 0.4cm length x 0.7cm width x 0.1cm depth; 0.22cm^2 area and 0.022cm^3 volume. There is Fat Layer (Subcutaneous Tissue) exposed. There is no tunneling or undermining noted. There is a medium amount of serosanguineous drainage noted. The wound margin is distinct with the outline attached to the wound base. There is large (67-100%) red granulation within the wound bed. There is a small (1-33%) amount of necrotic tissue within the wound bed including Adherent Slough. 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 I debrided nonviable tissue and I recommended continue the course with endoform under 3 layer compression. Unfortunately patient developed a new wound spontaneously to the left lower extremity secondary to lymphedema/venous insufficiency. He just got started on a new diuretic. This should help his swelling control. In the meantime we will use endoform under 3 layer compression here as well. Follow- up in 1 week. Procedures Wound #1 Pre-procedure diagnosis of Wound #1 is a Diabetic Wound/Ulcer of the Lower Extremity located  on the Right,Lateral Lower Leg .Severity of Tissue Pre Debridement is: Fat layer exposed. There was a Selective/Open Wound Non-Viable Tissue Debridement with a total area of 0.06 sq cm performed by Kalman Shan, DO. With the following instrument(s): Curette to remove Non-Viable tissue/material. Material removed includes [facility] after achieving pain control using Lidocaine 4% Topical Solution. No specimens were taken. A time out was conducted at 09:42, prior to the start of the procedure. A Minimum amount of bleeding was controlled with Pressure. The procedure was tolerated well with a pain level of 0 throughout and a pain level of 0 following the procedure. Post Debridement Measurements: 0.3cm length x 0.2cm width x 0.1cm depth; 0.005cm^3  volume. Character of Wound/Ulcer Post Debridement is improved. Severity of Tissue Post Debridement is: Fat layer exposed. Post procedure Diagnosis Wound #1: Same as Pre-Procedure General Notes: Scribed for Dr Heber [place] by Sharyn Creamer, RN. Pre-procedure diagnosis of Wound #1 is a Diabetic Wound/Ulcer of the Lower Extremity located on the Right,Lateral Lower Leg . There was a Three Layer Compression Therapy Procedure by Sharyn Creamer, RN. Post procedure Diagnosis Wound #1: Same as Pre-Procedure Wound #2 Pre-procedure diagnosis of Wound #2 is a Diabetic Wound/Ulcer of the Lower Extremity located on the Left,Anterior Lower Leg . There was a Three Layer Compression Therapy Procedure by Sharyn Creamer, RN. Post procedure Diagnosis Wound #2: Same as Pre-Procedure Plan Follow-up Appointments: Return Appointment in 1 week. - Dr. Heber  Room 9 Monday 06/26/22 @1$ :15 Other: - Speak with cardiology about fluid build up noted in legs. Anesthetic: (In clinic) Topical Lidocaine 5% applied to wound bed (In clinic) Topical Lidocaine 4% applied to wound bed Cellular or Tissue Based Products: Cellular or Tissue Based Product Type: - run insurance for [employer], epicord, and  organ[employer] advance tissue product. [employer] not approved. Epicord not covered. [employer] approved. Edema Control - Lymphedema / SCD / Other: Avoid standing for long periods of time. Patient to wear own compression stockings every day. - We will apply a tubi grip size E double folded today Home Health: [facility] for wound care. - [place] The following medication(s) was prescribed: lidocaine topical 4 % cream cream topical once daily for prior to debridement was prescribed at facility WOUND #1: - Lower Leg Wound Laterality: Right, Lateral Cleanser: Soap and Water (Home Health) 1 x Per Week/30 Days Discharge Instructions: May shower and wash wound with dial antibacterial soap and water prior to dressing change. Cleanser: Wound Cleanser (Home Health) 1 x Per Week/30 Days Discharge Instructions: Cleanse the wound with wound cleanser prior to applying a clean dressing using gauze sponges, not tissue or cotton balls. Prim Dressing: Endoform 2x2 in 1 x Per Week/30 Days ary Discharge Instructions: Moisten with saline Secondary Dressing: ABD Pad, 8x10 1 x Per Week/30 Days Discharge Instructions: Apply over primary dressing as directed. Secondary Dressing: Woven Gauze Sponge, Non-Sterile 4x4 in (Home Health) 1 x Per Week/30 Days Discharge Instructions: Apply over primary dressing as directed. Com pression Wrap: ThreePress (3 layer compression wrap) (Home Health) 1 x Per Week/30 Days Discharge Instructions: Apply three layer compression as directed. WOUND #2: - Lower Leg Wound Laterality: Left, Anterior Cleanser: Soap and Water (Home Health) 1 x Per Week/30 Days Discharge Instructions: May shower and wash wound with dial antibacterial soap and water prior to dressing change. Cleanser: Wound Cleanser (Home Health) 1 x Per Week/30 Days Discharge Instructions: Cleanse the wound with wound cleanser prior to applying a clean dressing using gauze sponges, not tissue  or cotton balls. Prim Dressing: Endoform 2x2 in 1 x Per Week/30 Days ary Discharge Instructions: Moisten with saline Secondary Dressing: ABD Pad, 8x10 1 x Per Week/30 Days Discharge Instructions: Apply over primary dressing as directed. Secondary Dressing: Woven Gauze Sponge, Non-Sterile 4x4 in (Home Health) 1 x Per Week/30 Days Discharge Instructions: Apply over primary dressing as directed. Com pression Wrap: ThreePress (3 layer compression wrap) (Home Health) 1 x Per Week/30 Days Discharge Instructions: Apply three layer compression as directed. Corey, [name] ([id number]) [id number] Page 9 of 11 1. In office sharp debridement 2. Endoform under 3 layer compressionoobilateral lower extremities 3. Follow-up in 1 week Electronic Signature(s) Signed: 06/19/2022 1:21:38 PM By: Kalman Shan  DO Entered By: Kalman Shan on 06/19/2022 09:55:28 -------------------------------------------------------------------------------- HxROS Details Patient Name: Date of Service: [name], [name] 06/19/2022 9:30 A M Medical Record Number: [id number] Patient Account Number: [id number] Date of Birth/Sex: Treating RN: 15-Nov-1945 (77 y.o. M) Primary Care Provider: Billey Gosling Other Clinician: Referring Provider: Treating Provider/Extender: [name] in Treatment: 22 Constitutional Symptoms (General Health) Medical History: Past Medical History Notes: CVA 2014 bilateral hearing loss Eyes Medical History: Positive for: Cataracts Respiratory Medical History: Positive for: Sleep Apnea - has CPAP does not wear Cardiovascular Medical History: Positive for: Congestive Heart Failure; Hypertension; Myocardial Infarction - 2014 Gastrointestinal Medical History: Past Medical History Notes: Diverticulosis Endocrine Medical History: Positive for: Type II Diabetes Time with diabetes: >20 years Treated with: Oral agents, Diet Blood sugar tested  every day: Yes Tested : BID Genitourinary Medical History: Past Medical History Notes: Renal insufficiency 20/ Musculoskeletal Medical History: Positive for: Gout Corey, [name] ([id number]) [id number] Page 10 of 11 Neurologic Medical History: Negative for: Seizure Disorder Oncologic Medical History: Past Medical History Notes: Prostate Ca HBO Extended History Items Eyes: Cataracts Immunizations Pneumococcal Vaccine: Received Pneumococcal Vaccination: No Implantable Devices No devices added Hospitalization / Surgery History Type of Hospitalization/Surgery 06/2021 inguinal hernia repair cardioversion 09/2020 ventral hernia repair 3/19 Family and Social History Cancer: Yes - Father; Diabetes: Yes - Mother,Father; Heart Disease: Yes - Mother,Siblings; Hypertension: Yes - Mother,Father; Former smoker - quit 1970; Marital Status - Married; Drug Use: No History; Caffeine Use: Moderate - coffee; Financial Concerns: No; Food, Clothing or Shelter Needs: No; Support System Lacking: No; Transportation Concerns: No Electronic Signature(s) Signed: 06/19/2022 1:21:38 PM By: Kalman Shan DO Entered By: Kalman Shan on 06/19/2022 09:49:23 -------------------------------------------------------------------------------- SuperBill Details Patient Name: Date of Service: [name], [name] 06/19/2022 Medical Record Number: [id number] Patient Account Number: [id number] Date of Birth/Sex: Treating RN: June 15, 1945 (77 y.o. 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 E11.622 Type 2 diabetes mellitus with other skin ulcer I50.42 Chronic combined systolic (congestive) and diastolic (congestive) heart  failure I25.119 Atherosclerotic heart disease of native coronary artery with unspecified angina pectoris I87.311 Chronic venous hypertension (idiopathic) with ulcer of right lower extremity I87.312 Chronic venous hypertension (idiopathic) with ulcer of left lower extremity S81.802A Unspecified open wound, left lower leg, initial encounter Facility Procedures : Corey, [name] Code: [id number] [facility] M ([id number]) L I Description: [phone] - DEBRIDE WOUND 1ST 20 SQ CM OR < ICD-10 Diagnosis Description [phone] 97.918 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918 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

## 2022-06-23 NOTE — Progress Notes (Signed)
  Echocardiogram Echocardiogram Transesophageal has been performed.  Corey [name] M 06/23/2022, 11:34 AM

## 2022-06-24 ENCOUNTER — Other Ambulatory Visit: Payer: Self-pay | Admitting: Internal Medicine

## 2022-06-26 ENCOUNTER — Encounter (HOSPITAL_BASED_OUTPATIENT_CLINIC_OR_DEPARTMENT_OTHER): Payer: Medicare HMO | Admitting: Internal Medicine

## 2022-06-26 ENCOUNTER — Other Ambulatory Visit: Payer: Self-pay

## 2022-06-26 ENCOUNTER — Encounter (HOSPITAL_COMMUNITY): Payer: Self-pay | Admitting: Cardiovascular Disease

## 2022-06-26 DIAGNOSIS — S91302A Unspecified open wound, left foot, initial encounter: Secondary | ICD-10-CM

## 2022-06-26 DIAGNOSIS — I130 Hypertensive heart and chronic kidney disease with heart failure and stage 1 through stage 4 chronic kidney disease, or unspecified chronic kidney disease: Secondary | ICD-10-CM | POA: Diagnosis not present

## 2022-06-26 DIAGNOSIS — E1122 Type 2 diabetes mellitus with diabetic chronic kidney disease: Secondary | ICD-10-CM | POA: Diagnosis not present

## 2022-06-26 DIAGNOSIS — L97918 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 with other specified severity: Secondary | ICD-10-CM | POA: Diagnosis not present

## 2022-06-26 DIAGNOSIS — I87313 Chronic venous hypertension (idiopathic) with ulcer of bilateral lower extremity: Secondary | ICD-10-CM | POA: Diagnosis not present

## 2022-06-26 DIAGNOSIS — E11621 Type 2 diabetes mellitus with foot ulcer: Secondary | ICD-10-CM | POA: Diagnosis not present

## 2022-06-26 DIAGNOSIS — S81802A Unspecified open wound, left lower leg, initial encounter: Secondary | ICD-10-CM | POA: Diagnosis not present

## 2022-06-26 DIAGNOSIS — E11622 Type 2 diabetes mellitus with other skin ulcer: Secondary | ICD-10-CM | POA: Diagnosis not present

## 2022-06-26 DIAGNOSIS — N184 Chronic kidney disease, stage 4 (severe): Secondary | ICD-10-CM | POA: Diagnosis not present

## 2022-06-26 LAB — ECHO TEE
Height: 66 in
Weight: 2720 oz

## 2022-06-26 MED ORDER — ROSUVASTATIN CALCIUM 5 MG PO TABS: 5.0000 mg | ORAL_TABLET | Freq: Every day | ORAL | 3 refills | Status: DC

## 2022-06-26 NOTE — Telephone Encounter (Signed)
Rx for Rosuvastatin sent to [facility]

## 2022-06-26 NOTE — Progress Notes (Addendum)
Corey, [name] ([id number]) [id number] Page 1 of 11 Visit Report for 06/26/2022 Chief Complaint Document Details Patient Name: Date of Service: Corey [name] 06/26/2022 1:15 PM Medical Record Number: [id number] Patient Account Number: [id number] Date of Birth/Sex: Treating RN: [birth year] (77 y.o. M) Primary Care Provider: Billey Gosling Other Clinician: Referring Provider: Treating Provider/Extender: Mickle Asper in Treatment: 74 Information Obtained from: Patient Chief Complaint 11/01/2021; right lower extremity wound Electronic Signature(s) Signed: 06/26/2022 3:45:51 PM By: Kalman Shan DO Entered By: Kalman Shan on 06/26/2022 13:45:39 -------------------------------------------------------------------------------- Debridement Details Patient Name: Date of Service: Corey [name], [name] 06/26/2022 1:15 PM Medical Record Number: [id number] Patient Account Number: [id number] Date of Birth/Sex: Treating RN: 1945/07/15 (77 y.o. Mare Ferrari Primary Care Provider: Billey Gosling Other Clinician: Referring Provider: Treating Provider/Extender: Mickle Asper in Treatment: 33 Debridement Performed for Assessment: Wound #3 Left Calcaneus Performed By: Physician Kalman Shan, DO Debridement Type: Debridement Severity of Tissue Pre Debridement: Fat layer exposed Level of Consciousness (Pre-procedure): Awake and Alert Pre-procedure Verification/Time Out Yes - 13:39 Taken: Start Time: 13:39 Pain Control: Lidocaine 4% T opical Solution T Area Debrided (L x W): otal 1.4 (cm) x 0.4 (cm) = 0.56 (cm) Tissue and other material debrided: Non-Viable, Callus, Slough, Subcutaneous, Slough Level: Skin/Subcutaneous Tissue Debridement Description: Excisional Instrument: Curette Bleeding: Minimum Hemostasis Achieved: Pressure Procedural Pain: 0 Post Procedural Pain: 0 Response to Treatment: Procedure was tolerated  well Level of Consciousness (Post- Awake and Alert procedure): Post Debridement Measurements of Total Wound Length: (cm) 1.4 Width: (cm) 0.4 Depth: (cm) 0.1 Volume: (cm) 0.044 Character of Wound/Ulcer Post Debridement: Improved Severity of Tissue Post Debridement: Fat layer exposed Corey [name] ([id number]) [id number] Page 2 of 11 Post Procedure Diagnosis Same as Pre-procedure Notes Scribed for Dr Heber Dallam by Sharyn Creamer, Rn Electronic Signature(s) Signed: 06/26/2022 3:45:51 PM By: Kalman Shan DO Signed: 06/26/2022 4:13:25 PM By: Sharyn Creamer RN, BSN Entered By: Sharyn Creamer on 06/26/2022 13:42:57 -------------------------------------------------------------------------------- HPI Details Patient Name: Date of Service: Corey [name], [name] 06/26/2022 1:15 PM Medical Record Number: [id number] Patient Account Number: [id number] Date of Birth/Sex: Treating RN: Aug 07, 1945 (77 y.o. M) Primary Care Provider: Billey Gosling Other Clinician: Referring Provider: Treating Provider/Extender: Mickle Asper in Treatment: 27 History of Present Illness HPI Description: Admission 11/01/2021 [name] Is a 77 year old male with a past medical history of of type 2 diabetes, coronary artery disease, stage IV kidney disease acute on chronic combined systolic and diastolic heart failure that presents to the clinic for a 75-monthhistory of right lower extremity wound. He states he is not sure how this started. It is completely eschared. He reports his wound has been stable for the past month. He has been keeping the area covered. He has mild chronic pain. He denies signs of infection. He states he has a history of lower extremity ulcers last being on the left leg. He could not tell me how this was treated in the past. 6/27; patient presents for follow-up. He is scheduled for his lower extremity arterial studies on July 6th. He has been using  Medihoney to the wound bed. He has no issues or complaints today. He denies signs of infection. 7/6; patient presents for follow-up. He has home health that reported the wound having odor and increased pain last week. He was sent to urgent care. He was started on doxycycline. He is continuing Medihoney. He has his scheduled ABIs w/TBIs today.  7/10; patient presents for follow-up. He had his arterial studies done last week. He had triphasic waveforms throughout the right lower extremity. His TBI was 0.64. He has good pedal pulses. He is almost done with his course of antibiotics. He has been using mupirocin ointment to the wound bed. 7/17; patient presents for follow-up. He has been using Iodosorb under compression therapy. He has no issues or complaints today. He states he is receiving Keystone antibiotic tomorrow In the mail. 7/24; patient presents for follow-up. He has been doing Dakin's wet-to-dry dressings to the wound bed daily without issues. He currently denies signs of infection. He reports completing his course of doxycycline. He has his Keystone antibiotic with him today. 7/31; patient presents for follow-up. We have been using Iodoflex with [facility] under compression therapy. He has no issues or complaints today. He denies signs of infection. 8/7; patient presents for follow-up. We have been using Iodoflex with [facility] under compression therapy. Home health came out once last week to change the wrap. He has no issues or complaints today. He denies signs of infection. 8/14; patient presents for follow-up. We have been using Keystone with Hydrofera Blue under compression therapy. He has tolerated this well. He has no issues or complaints today. 8/21; patient presents for follow-up. We have been using Keystone with Hydrofera Blue under compression therapy. The Hydrofera Blue was stuck on tightly to the wound bed today. Other than that he has no issues or complaints. We have not heard back  for insurance approval for skin sub. 8/28; patient presents for follow-up. We ordered blast X and collagen powder at last clinic visit. It is unclear if he has received this. We placed Keystone with Fibracol under 3 layer compression last week. Patient has home health. Patient has no issues or complaints today. He was not approved for TheraSkin. 9/5; patient presents for follow-up. Insurance would not cover blast X. We do have a sample and he would like to try this. We have been using Keystone antibiotic and collagen under 3 layer compression. He has no issues or complaints today. 9/18; patient presents for follow-up. We have been using collagen with blast X and Keystone under compression therapy. He has no issues or complaints today. 9/25; patient presents for follow-up. We have continued to use collagen, blast X and Keystone under compression therapy. Patient has no issues or complaints today. 10/6; patient presents for follow-up. We have been using collagen, blast X and Keystone under compression therapy. He denies signs of infection and has no issues or complaints today. Patient has home health that comes out on Wednesdays and Fridays. 10/16; patient presents for follow-up. He has been using collagen, blast X and Keystone with benefit to the wound bed under compression therapy. He is about to run out of [facility] antibiotic ointment. He has no issues or complaints today. Corey [name], Corey [name] ([id number]) [id number] Page 3 of 11 10/23; patient presents for follow-up. We continue to use collagen, blast X and Keystone under compression therapy. Continued report in wound healing. Home health continues to come out to do dressing changes. Patient has no issues or complaints today. 10/30; patient presents for follow-up. We have been using collagen, blast X and Keystone antibiotic under compression therapy. His wound continues to show improvement in healing. He has no issues or  complaints today. 11/30; patient was last seen 1 month ago. He has missed his last clinic visit. He has home health who changes the dressings 3 times weekly. He has been using collagen  with [facility] antibiotic under 3 layer compression. He has run out of [place] antibiotic ointment. The wound is almost healed. 12/18; patient was last seen 3 weeks ago. We have been using collagen with Keystone antibiotic under 3 layer compression. Home health has been coming out twice a week. He has no issues or complaints today. 1/4; patient presents for follow-up. We have been using collagen under 3 layer compression to the right lower extremity. Home health has been coming out twice weekly to change the dressing. He states the wrap slid down this past week. 1/18; patient presents for follow-up. We have been using Hydrofera Blue under 3 layer compression to the right lower extremity. Home health is discharging the patient. He has no issues or complaints today. 1/26; patient presents for follow-up. We have been using Hydrofera Blue under 3 layer compression to the right lower extremity. He states his diuretic was increased. He has no issues or complaints today. 2/5; patient presents for follow-up. We have been using endoform under 3 layer compression to the right lower extremity. Unfortunately has developed a new wound to the left lower extremity. This started spontaneously over the past week. He was recently started on a new diuretic. He denies signs of infection. 2/12; patient presents for follow-up. We have been using endoform under 3 layer compression to the lower extremities bilaterally. His wounds of healed. Unfortunately has developed a new wound to the left heel. He is not quite sure how this happened. He denies signs of infection. Electronic Signature(s) Signed: 06/26/2022 3:45:51 PM By: Kalman Shan DO Entered By: Kalman Shan on 06/26/2022  13:46:46 -------------------------------------------------------------------------------- Physical Exam Details Patient Name: Date of Service: Corey [name], [name] 06/26/2022 1:15 PM Medical Record Number: [id number] Patient Account Number: [id number] Date of Birth/Sex: Treating RN: 1946-02-03 (77 y.o. M) Primary Care Provider: Billey Gosling Other Clinician: Referring Provider: Treating Provider/Extender: Mickle Asper in Treatment: 42 Constitutional respirations regular, non-labored and within target range for patient.. Cardiovascular 2+ dorsalis pedis/posterior tibialis pulses. Psychiatric pleasant and cooperative. Notes Epithelization to the previous wound sites of the lower extremities bilaterally. T the left heel there is an open wound that has occurred over a dried area and has o cracked. There is nonviable tissue and granulation tissue present. Electronic Signature(s) Signed: 06/26/2022 3:45:51 PM By: Kalman Shan DO Entered By: Kalman Shan on 06/26/2022 13:47:43 -------------------------------------------------------------------------------- Physician Orders Details Patient Name: Date of Service: Corey [name], [name] 06/26/2022 1:15 PM Medical Record Number: [id number] Patient Account Number: [id number] Date of Birth/Sex: Treating RN: [birth year] (77 y.o. Mare Ferrari Primary Care Provider: Billey Gosling Other Clinician: EKIN, BARDO ([id number]) [id number] Page 4 of 11 Referring Provider: Treating Provider/Extender: Mickle Asper in Treatment: 52 Verbal / Phone Orders: No Diagnosis Coding Follow-up Appointments ppointment in 1 week. - Dr. Heber  Room 9 Monday 07/03/22 @ 1100 Return A Other: - Speak with cardiology about fluid build up noted in legs. Anesthetic (In clinic) Topical Lidocaine 5% applied to wound bed (In clinic) Topical Lidocaine 4% applied to wound bed Cellular or Tissue Based  Products Cellular or Tissue Based Product Type: - run insurance for [employer], epicord, and organogenesis advance tissue product. Theraskin not approved. Epicord not covered. Organogenesis Apligraf approved. Edema Control - Lymphedema / SCD / Other Avoid standing for long periods of time. Patient to wear own compression stockings every day. - We will apply a tubi grip size E double folded today [place] [facility] for  wound care. - [place] Wound Treatment Wound #3 - Calcaneus Wound Laterality: Left Cleanser: Wound Cleanser 1 x Per Day/15 Days Discharge Instructions: Cleanse the wound with wound cleanser prior to applying a clean dressing using gauze sponges, not tissue or cotton balls. Prim Dressing: Hydrofera Blue Ready Transfer Foam, 2.5x2.5 (in/in) 1 x Per Day/15 Days ary Discharge Instructions: Apply directly to wound bed as directed Prim Dressing: MediHoney Gel, tube 1.5 (oz) 1 x Per Day/15 Days ary Discharge Instructions: Apply to wound bed as instructed Secondary Dressing: ALLEVYN Heel 4 1/2in x 5 1/2in / 10.5cm x 13.5cm (DME) (Generic) 1 x Per Day/15 Days Discharge Instructions: Apply over primary dressing as directed. Secondary Dressing: Woven Gauze Sponge, Non-Sterile 4x4 in (DME) (Generic) 1 x Per Day/15 Days Discharge Instructions: Apply over primary dressing as directed. Secured With: [employer], 4.5x3.1 (in/yd) (DME) (Generic) 1 x Per Day/15 Days Discharge Instructions: Secure with Kerlix as directed. Secured With: 59M Medipore H Soft Cloth Surgical T ape, 4 x 10 (in/yd) (DME) (Generic) 1 x Per Day/15 Days Discharge Instructions: Secure with tape as directed. Patient Medications llergies: hydrochlorothiazide A Notifications Medication Indication Start End prior to debridement 06/26/2022 lidocaine DOSE topical 4 % cream - cream topical once daily Electronic Signature(s) Signed: 06/26/2022 3:45:51 PM By: Kalman Shan DO Signed:  06/26/2022 4:13:25 PM By: Sharyn Creamer RN, BSN Entered By: Sharyn Creamer on 06/26/2022 13:52:20 -------------------------------------------------------------------------------- Problem List Details Patient Name: Date of Service: Corey [name], [name] 06/26/2022 1:15 PM Corey [name] ([id number]) [id number].pdf Page 5 of 11 Medical Record Number: [id number] Patient Account Number: [id number] Date of Birth/Sex: Treating RN: 09-28-1945 (77 y.o. M) Primary Care Provider: Billey Gosling Other Clinician: Referring Provider: Treating Provider/Extender: Mickle Asper in Treatment: 34 Active Problems ICD-10 Encounter Code Description Active Date MDM Diagnosis L97.918 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 with other 11/01/2021 No Yes specified severity E11.622 Type 2 diabetes mellitus with other skin ulcer 11/01/2021 No Yes I50.42 Chronic combined systolic (congestive) and diastolic (congestive) heart failure 11/01/2021 No Yes I25.119 Atherosclerotic heart disease of native coronary artery with unspecified angina 11/01/2021 No Yes pectoris I87.311 Chronic venous hypertension (idiopathic) with ulcer of right lower extremity 12/26/2021 No Yes I87.312 Chronic venous hypertension (idiopathic) with ulcer of left lower extremity 06/19/2022 No Yes S81.802A Unspecified open wound, left lower leg, initial encounter 06/19/2022 No Yes S91.302A Unspecified open wound, left foot, initial encounter 06/26/2022 No Yes E11.621 Type 2 diabetes mellitus with foot ulcer 06/26/2022 No Yes Inactive Problems Resolved Problems Electronic Signature(s) Signed: 06/26/2022 3:45:51 PM By: Kalman Shan DO Entered By: Kalman Shan on 06/26/2022 13:45:24 -------------------------------------------------------------------------------- Progress Note Details Patient Name: Date of Service: Corey [name], [name] 06/26/2022 1:15 PM Medical Record Number:  [id number] Patient Account Number: [id number] Date of Birth/Sex: Treating RN: January 26, 1946 (77 y.o. M) Primary Care Provider: Billey Gosling Other Clinician: Referring Provider: Treating Provider/Extender: Mickle Asper in Treatment: [address], [place] ([id number]) [id number] Page 6 of 11 Subjective Chief Complaint Information obtained from Patient 11/01/2021; right lower extremity wound History of Present Illness (HPI) Admission 11/01/2021 Mr. Eusebio Stutzman Is a 77 year old male with a past medical history of of type 2 diabetes, coronary artery disease, stage IV kidney disease acute on chronic combined systolic and diastolic heart failure that presents to the clinic for a 89-monthhistory of right lower extremity wound. He states he is not sure how this started. It is completely eschared. He  reports his wound has been stable for the past month. He has been keeping the area covered. He has mild chronic pain. He denies signs of infection. He states he has a history of lower extremity ulcers last being on the left leg. He could not tell me how this was treated in the past. 6/27; patient presents for follow-up. He is scheduled for his lower extremity arterial studies on July 6th. He has been using Medihoney to the wound bed. He has no issues or complaints today. He denies signs of infection. 7/6; patient presents for follow-up. He has home health that reported the wound having odor and increased pain last week. He was sent to urgent care. He was started on doxycycline. He is continuing Medihoney. He has his scheduled ABIs w/TBIs today. 7/10; patient presents for follow-up. He had his arterial studies done last week. He had triphasic waveforms throughout the right lower extremity. His TBI was 0.64. He has good pedal pulses. He is almost done with his course of antibiotics. He has been using mupirocin ointment to the wound bed. 7/17; patient presents for  follow-up. He has been using Iodosorb under compression therapy. He has no issues or complaints today. He states he is receiving Keystone antibiotic tomorrow In the mail. 7/24; patient presents for follow-up. He has been doing Dakin's wet-to-dry dressings to the wound bed daily without issues. He currently denies signs of infection. He reports completing his course of doxycycline. He has his Keystone antibiotic with him today. 7/31; patient presents for follow-up. We have been using Iodoflex with [facility] under compression therapy. He has no issues or complaints today. He denies signs of infection. 8/7; patient presents for follow-up. We have been using Iodoflex with [facility] under compression therapy. Home health came out once last week to change the wrap. He has no issues or complaints today. He denies signs of infection. 8/14; patient presents for follow-up. We have been using Keystone with Hydrofera Blue under compression therapy. He has tolerated this well. He has no issues or complaints today. 8/21; patient presents for follow-up. We have been using Keystone with Hydrofera Blue under compression therapy. The Hydrofera Blue was stuck on tightly to the wound bed today. Other than that he has no issues or complaints. We have not heard back for insurance approval for skin sub. 8/28; patient presents for follow-up. We ordered blast X and collagen powder at last clinic visit. It is unclear if he has received this. We placed Keystone with Fibracol under 3 layer compression last week. Patient has home health. Patient has no issues or complaints today. He was not approved for TheraSkin. 9/5; patient presents for follow-up. Insurance would not cover blast X. We do have a sample and he would like to try this. We have been using Keystone antibiotic and collagen under 3 layer compression. He has no issues or complaints today. 9/18; patient presents for follow-up. We have been using collagen with blast X and  Keystone under compression therapy. He has no issues or complaints today. 9/25; patient presents for follow-up. We have continued to use collagen, blast X and Keystone under compression therapy. Patient has no issues or complaints today. 10/6; patient presents for follow-up. We have been using collagen, blast X and Keystone under compression therapy. He denies signs of infection and has no issues or complaints today. Patient has home health that comes out on Wednesdays and Fridays. 10/16; patient presents for follow-up. He has been using collagen, blast X and Keystone with benefit to  the wound bed under compression therapy. He is about to run out of [facility] antibiotic ointment. He has no issues or complaints today. 10/23; patient presents for follow-up. We continue to use collagen, blast X and Keystone under compression therapy. Continued report in wound healing. Home health continues to come out to do dressing changes. Patient has no issues or complaints today. 10/30; patient presents for follow-up. We have been using collagen, blast X and Keystone antibiotic under compression therapy. His wound continues to show improvement in healing. He has no issues or complaints today. 11/30; patient was last seen 1 month ago. He has missed his last clinic visit. He has home health who changes the dressings 3 times weekly. He has been using collagen with Keystone antibiotic under 3 layer compression. He has run out of [place] antibiotic ointment. The wound is almost healed. 12/18; patient was last seen 3 weeks ago. We have been using collagen with Keystone antibiotic under 3 layer compression. Home health has been coming out twice a week. He has no issues or complaints today. 1/4; patient presents for follow-up. We have been using collagen under 3 layer compression to the right lower extremity. Home health has been coming out twice weekly to change the dressing. He states the wrap slid down this past  week. 1/18; patient presents for follow-up. We have been using Hydrofera Blue under 3 layer compression to the right lower extremity. Home health is discharging the patient. He has no issues or complaints today. 1/26; patient presents for follow-up. We have been using Hydrofera Blue under 3 layer compression to the right lower extremity. He states his diuretic was increased. He has no issues or complaints today. 2/5; patient presents for follow-up. We have been using endoform under 3 layer compression to the right lower extremity. Unfortunately has developed a new wound to the left lower extremity. This started spontaneously over the past week. He was recently started on a new diuretic. He denies signs of infection. 2/12; patient presents for follow-up. We have been using endoform under 3 layer compression to the lower extremities bilaterally. His wounds of healed. Unfortunately has developed a new wound to the left heel. He is not quite sure how this happened. He denies signs of infection. Patient History Family History Cancer - Father, Diabetes - Mother,Father, Heart Disease - Mother,Siblings, Hypertension - Mother,Father. Corey [name], Corey [name] ([id number]) [id number] Page 7 of 41 Social History Former smoker - quit 1970, Marital Status - Married, Drug Use - No History, Caffeine Use - Moderate - coffee. Medical History Eyes Patient has history of Cataracts Respiratory Patient has history of Sleep Apnea - has CPAP does not wear Cardiovascular Patient has history of Congestive Heart Failure, Hypertension, Myocardial Infarction - 2014 Endocrine Patient has history of Type II Diabetes Musculoskeletal Patient has history of Gout Neurologic Denies history of Seizure Disorder Hospitalization/Surgery History - 06/2021 inguinal hernia repair. - cardioversion 09/2020. - ventral hernia repair 3/19. Medical A Surgical History Notes nd Constitutional Symptoms (General  Health) CVA 2014 bilateral hearing loss Gastrointestinal Diverticulosis Genitourinary Renal insufficiency 20/ Oncologic Prostate Ca Objective Constitutional respirations regular, non-labored and within target range for patient.. Vitals Time Taken: 1:22 PM, Height: 66 in, Weight: 145 lbs, BMI: 23.4, Temperature: 97.6 F, Pulse: 55 bpm, Respiratory Rate: 18 breaths/min, Blood Pressure: 144/78 mmHg. Cardiovascular 2+ dorsalis pedis/posterior tibialis pulses. Psychiatric pleasant and cooperative. General Notes: Epithelization to the previous wound sites of the lower extremities bilaterally. T the left heel there is an open wound that has occurred  over a o dried area and has cracked. There is nonviable tissue and granulation tissue present. Integumentary (Hair, Skin) Wound #1 status is Healed - Epithelialized. Original cause of wound was Gradually Appeared. The date acquired was: 08/15/2021. The wound has been in treatment 33 weeks. The wound is located on the Right,Lateral Lower Leg. The wound measures 0cm length x 0cm width x 0cm depth; 0cm^2 area and 0cm^3 volume. There is Fat Layer (Subcutaneous Tissue) exposed. There is no tunneling or undermining noted. There is a medium amount of serosanguineous drainage noted. The wound margin is distinct with the outline attached to the wound base. There is no granulation within the wound bed. There is no necrotic tissue within the wound bed. The periwound skin appearance exhibited: Scarring. The periwound skin appearance did not exhibit: Callus, Crepitus, Excoriation, Induration, Rash, Dry/Scaly, Maceration, Atrophie Blanche, Cyanosis, Ecchymosis, Hemosiderin Staining, Mottled, Pallor, Rubor, Erythema. Periwound temperature was noted as No Abnormality. The periwound has tenderness on palpation. Wound #2 status is Healed - Epithelialized. Original cause of wound was Gradually Appeared. The date acquired was: 06/19/2022. The wound has been in treatment 1  weeks. The wound is located on the Left,Anterior Lower Leg. The wound measures 0cm length x 0cm width x 0cm depth; 0cm^2 area and 0cm^3 volume. There is Fat Layer (Subcutaneous Tissue) exposed. There is a medium amount of serosanguineous drainage noted. The wound margin is distinct with the outline attached to the wound base. There is large (67-100%) red granulation within the wound bed. There is a small (1-33%) amount of necrotic tissue within the wound bed including Adherent Slough. Wound #3 status is Open. Original cause of wound was Skin T ear/Laceration. The date acquired was: 06/26/2022. The wound is located on the Left Calcaneus. The wound measures 1.4cm length x 0.4cm width x 0.1cm depth; 0.44cm^2 area and 0.044cm^3 volume. There is Fat Layer (Subcutaneous Tissue) exposed. There is no tunneling or undermining noted. There is a medium amount of serosanguineous drainage noted. There is large (67-100%) red granulation within the wound bed. There is a small (1-33%) amount of necrotic tissue within the wound bed including Eschar and Adherent Slough. 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 Type 2 diabetes mellitus with other skin ulcer Chronic combined systolic (congestive) and diastolic (congestive) heart failure Atherosclerotic heart disease of native coronary artery with unspecified angina pectoris Corey [name], Corey [name] ([id number]) [id number] Page 8 of 11 Chronic venous hypertension (idiopathic) with ulcer of right lower extremity Chronic venous hypertension (idiopathic) with ulcer of left lower extremity Unspecified open wound, left lower leg, initial encounter Unspecified open wound, left foot, initial encounter Type 2 diabetes mellitus with foot ulcer Patient has done well with endoform under compression therapy to his lower extremities bilaterally. He is wounds have healed. He  has ordered compression stockings but they have not arrived. I recommended starting to wear these when they do come in. For now we will give him Tubigrip Unfortunately has developed a new wound to the left heel. This is likely from dry skin that is cracked. I debrided nonviable tissue and recommended Medihoney and Hydrofera Blue. I recommended aggressive offloading by floating his heels when sitting. Follow-up in 1 week. Procedures Wound #3 Pre-procedure diagnosis of Wound #3 is a Diabetic Wound/Ulcer of the Lower Extremity located on the Left Calcaneus .Severity of Tissue Pre Debridement is: Fat layer exposed. There was a Excisional Skin/Subcutaneous Tissue Debridement with a total area of 0.56 sq  cm performed by Kalman Shan, DO. With the following instrument(s): Curette to remove Non-Viable tissue/material. Material removed includes Callus, Subcutaneous Tissue, and Slough after achieving pain control using Lidocaine 4% T opical Solution. No specimens were taken. A time out was conducted at 13:39, prior to the start of the procedure. A Minimum amount of bleeding was controlled with Pressure. The procedure was tolerated well with a pain level of 0 throughout and a pain level of 0 following the procedure. Post Debridement Measurements: 1.4cm length x 0.4cm width x 0.1cm depth; 0.044cm^3 volume. Character of Wound/Ulcer Post Debridement is improved. Severity of Tissue Post Debridement is: Fat layer exposed. Post procedure Diagnosis Wound #3: Same as Pre-Procedure General Notes: Scribed for Dr Heber Sheridan by Sharyn Creamer, Rn. Plan Follow-up Appointments: Return Appointment in 1 week. - Dr. Heber Gilman Room 9 Monday 07/03/22 @ 1100 Other: - Speak with cardiology about fluid build up noted in legs. Anesthetic: (In clinic) Topical Lidocaine 5% applied to wound bed (In clinic) Topical Lidocaine 4% applied to wound bed Cellular or Tissue Based Products: Cellular or Tissue Based Product Type: - run  insurance for [employer], epicord, and organogenesis advance tissue product. Theraskin not approved. Epicord not covered. [employer] Apligraf approved. Edema Control - Lymphedema / SCD / Other: Avoid standing for long periods of time. Patient to wear own compression stockings every day. - We will apply a tubi grip size E double folded today Home Health: [facility]. - [facility] The following medication(s) was prescribed: lidocaine topical 4 % cream cream topical once daily for prior to debridement was prescribed at facility WOUND #3: - Calcaneus Wound Laterality: Left Cleanser: Wound Cleanser 1 x Per Day/15 Days Discharge Instructions: Cleanse the wound with wound cleanser prior to applying a clean dressing using gauze sponges, not tissue or cotton balls. Prim Dressing: Hydrofera Blue Ready Transfer Foam, 2.5x2.5 (in/in) 1 x Per Day/15 Days ary Discharge Instructions: Apply directly to wound bed as directed Prim Dressing: MediHoney Gel, tube 1.5 (oz) 1 x Per Day/15 Days ary Discharge Instructions: Apply to wound bed as instructed Secondary Dressing: ALLEVYN Heel 4 1/2in x 5 1/2in / 10.5cm x 13.5cm (DME) (Generic) 1 x Per Day/15 Days Discharge Instructions: Apply over primary dressing as directed. Secondary Dressing: Woven Gauze Sponge, Non-Sterile 4x4 in (DME) (Generic) 1 x Per Day/15 Days Discharge Instructions: Apply over primary dressing as directed. Secured With: [employer], 4.5x3.1 (in/yd) (DME) (Generic) 1 x Per Day/15 Days Discharge Instructions: Secure with Kerlix as directed. Secured With: 32M Medipore H Soft Cloth Surgical T ape, 4 x 10 (in/yd) (DME) (Generic) 1 x Per Day/15 Days Discharge Instructions: Secure with tape as directed. 1. In office sharp debridement 2. Medihoney and Hydrofera Blue 3. Follow-up in 1 week 4. Compression stockings daily - tubigrip given in office Electronic Signature(s) Signed: 07/02/2022 2:19:36 PM By:  Deon Pilling RN, BSN Signed: 07/03/2022 4:03:53 PM By: Kalman Shan DO Previous Signature: 06/26/2022 3:45:51 PM Version By: Kalman Shan DO Entered By: Deon Pilling on 07/02/2022 14:16:48 Corey [name] ([id number]) [id number] Page 9 of 11 -------------------------------------------------------------------------------- HxROS Details Patient Name: Date of Service: Corey [name]. 06/26/2022 1:15 PM Medical Record Number: [id number] Patient Account Number: [id number] Date of Birth/Sex: Treating RN: Jul 10, 1945 (77 y.o. M) Primary Care Provider: Billey Gosling Other Clinician: Referring Provider: Treating Provider/Extender: [name] in Treatment: 83 Constitutional Symptoms ([place]) Medical History: Past Medical History Notes: CVA 2014 bilateral hearing loss Eyes Medical History:  Positive for: Cataracts Respiratory Medical History: Positive for: Sleep Apnea - has CPAP does not wear Cardiovascular Medical History: Positive for: Congestive Heart Failure; Hypertension; Myocardial Infarction - 2014 Gastrointestinal Medical History: Past Medical History Notes: Diverticulosis Endocrine Medical History: Positive for: Type II Diabetes Time with diabetes: >20 years Treated with: Oral agents, Diet Blood sugar tested every day: Yes Tested : BID Genitourinary Medical History: Past Medical History Notes: Renal insufficiency 20/ Musculoskeletal Medical History: Positive for: Gout Neurologic Medical History: Negative for: Seizure Disorder Oncologic Medical History: Past Medical History Notes: Prostate Ca HBO Extended History Items Eyes: Cataracts Corey [name], Corey [name] ([id number]) [id number] Page 10 of 11 Immunizations Pneumococcal Vaccine: Received Pneumococcal Vaccination: No Implantable Devices No devices added Hospitalization / Surgery History Type of  Hospitalization/Surgery 06/2021 inguinal hernia repair cardioversion 09/2020 ventral hernia repair 3/19 Family and Social History Cancer: Yes - Father; Diabetes: Yes - Mother,Father; Heart Disease: Yes - Mother,Siblings; Hypertension: Yes - Mother,Father; Former smoker - quit 1970; Marital Status - Married; Drug Use: No History; Caffeine Use: Moderate - coffee; Financial Concerns: No; Food, Clothing or Shelter Needs: No; Support System Lacking: No; Transportation Concerns: No Electronic Signature(s) Signed: 06/26/2022 3:45:51 PM By: Kalman Shan DO Entered By: Kalman Shan on 06/26/2022 13:46:51 -------------------------------------------------------------------------------- SuperBill Details Patient Name: Date of Service: Corey [name], [name] 06/26/2022 Medical Record Number: [id number] Patient Account Number: [id number] Date of Birth/Sex: Treating RN: 01-18-1946 (77 y.o. 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 E11.622 Type 2 diabetes mellitus with other skin ulcer I50.42 Chronic combined systolic (congestive) and diastolic (congestive) heart failure I25.119 Atherosclerotic heart disease of native coronary artery with unspecified angina pectoris I87.311 Chronic venous hypertension (idiopathic) with ulcer of right lower extremity I87.312 Chronic venous hypertension (idiopathic) with ulcer of left lower extremity S81.802A Unspecified open wound, left lower leg, initial encounter S91.302A Unspecified open wound, left foot, initial encounter E11.621 Type 2 diabetes mellitus with foot ulcer Facility Procedures : CPT4 Code: [id number] Description: [id number] - DEB SUBQ TISSUE 20 SQ CM/< ICD-10 Diagnosis Description S91.302A  Unspecified open wound, left foot, initial encounter E11.621 Type 2 diabetes mellitus with foot ulcer Modifier: Quantity: 1 Physician Procedures : CPT4 Code Description Modifier [id number] - WC PHYS LEVEL 3 - EST PT ICD-10 Diagnosis Description S91.302A Unspecified open wound, left foot, initial encounter E11.621 Type 2 diabetes mellitus with foot ulcer Corey [name], Corey [name] ([id number])  [id number] - WC PHYS SUBQ TISS 20 SQ CM 1 ICD-10 Diagnosis Description [id number] Unspecified open wound, left foot, initial encounter E11.621 Type 2 diabetes mellitus with foot ulcer Quantity: 1 f Page 11 of 11 Electronic Signature(s) Signed: 06/26/2022 3:45:51 PM By: Kalman Shan DO Entered By: Kalman Shan on 06/26/2022 13:52:32

## 2022-06-26 NOTE — Progress Notes (Signed)
ESSEY, BRESSLER ([id number]) [id number] Page 1 of 9 Visit Report for 06/26/2022 Arrival Information Details Patient Name: Date of Service: Corey [name]. 06/26/2022 1:15 PM Medical Record Number: [id number] Patient Account Number: [id number] Date of Birth/Sex: Treating RN: 06-28-1945 (77 y.o. [name]) Primary Care Dyanne Yorks: Billey Gosling Other Clinician: Referring Leilynn Pilat: Treating Chellsie Gomer/Extender: Mickle Asper in Treatment: 8 Visit Information History Since Last Visit Added or deleted any medications: No Patient Arrived: Ambulatory Any new allergies or adverse reactions: No Arrival Time: 13:22 Had a fall or experienced change in No Accompanied By: wife activities of daily living that may affect Transfer Assistance: None risk of falls: Patient Identification Verified: Yes Signs or symptoms of abuse/neglect since last visito No Secondary Verification Process Completed: Yes Hospitalized since last visit: No Patient Requires Transmission-Based No Implantable device outside of the clinic excluding No Precautions: cellular tissue based products placed in the center Patient Has Alerts: Yes since last visit: Patient Alerts: in clinic R [place]ley Has Compression in Place as Prescribed: Yes 11/17/2021 ABIs: both [place] Pain Present Now: No 11/17/2021 TBI: R0.64 L0.58 Electronic Signature(s) Signed: 06/26/2022 4:13:33 PM By: Erenest Blank Entered By: Erenest Blank on 06/26/2022 13:22:53 -------------------------------------------------------------------------------- Encounter Discharge Information Details Patient Name: Date of Service: Corey [name], Corey [name]. 06/26/2022 1:15 PM Medical Record Number: [id number] Patient Account Number: [id number] Date of Birth/Sex: Treating RN: 02-16-1946 (77 y.o. Mare Ferrari Primary Care Vasiliy Mccarry: Billey Gosling Other Clinician: Referring Kelvon Giannini: Treating Takaya Hyslop/Extender: Mickle Asper in  Treatment: 56 Encounter Discharge Information Items Post Procedure Vitals Discharge Condition: Stable Temperature (F): 97.6 Ambulatory Status: Ambulatory Pulse (bpm): 55 Discharge Destination: Home Respiratory Rate (breaths/min): 18 Transportation: Private Auto Blood Pressure (mmHg): 144/78 Accompanied By: wife Schedule Follow-up Appointment: Yes Clinical Summary of Care: Patient Declined Electronic Signature(s) Signed: 06/26/2022 4:13:25 PM By: Sharyn Creamer RN, BSN Entered By: Sharyn Creamer on 06/26/2022 Corey [name], Corey [name] ([id number] Page 2 of 9 -------------------------------------------------------------------------------- Lower Extremity Assessment Details Patient Name: Date of Service: Corey [name]. 06/26/2022 1:15 PM Medical Record Number: [id number] Patient Account Number: [id number] Date of Birth/Sex: Treating RN: November 27, 1945 (77 y.o. [name]) Primary Care Irie Fiorello: Billey Gosling Other Clinician: Referring Shylo Zamor: Treating Bobbijo Holst/Extender: [name] in Treatment: 33 Edema Assessment Assessed: [Left: No] [Right: No] Edema: [Left: Ye] [Right: s] Calf Left: Right: Point of Measurement: 33 cm From Medial Instep 36 cm 31.6 cm Ankle Left: Right: Point of Measurement: 11 cm From Medial Instep 19.8 cm 19.5 cm Electronic Signature(s) Signed: 06/26/2022 4:13:33 PM By: Erenest Blank Entered By: Erenest Blank on 06/26/2022 13:31:38 -------------------------------------------------------------------------------- Multi Wound Chart Details Patient Name: Date of Service: Corey [name], Corey [name]. 06/26/2022 1:15 PM Medical Record Number: [id number] Patient Account Number: [id number] Date of Birth/Sex: Treating RN: 1946/01/16 (77 y.o. [name]) Primary Care Rodolfo Gaster: Billey Gosling Other Clinician: Referring Oluwadarasimi Redmon: Treating Hensley Aziz/Extender: Mickle Asper in Treatment: 42 Vital Signs Height(in):  66 Pulse(bpm): 55 Weight(lbs): 145 Blood Pressure(mmHg): 144/78 Body Mass Index(BMI): 23.4 Temperature(F): 97.6 Respiratory Rate(breaths/min): 18 [1:Photos:] Right, Lateral Lower Leg Left, Anterior Lower Leg Left Calcaneus Wound Location: Gradually Appeared Gradually Appeared Skin Tear/Laceration Wounding Event: Diabetic Wound/Ulcer of the Lower Diabetic Wound/Ulcer of the Lower Diabetic Wound/Ulcer of the Lower Primary Etiology: Extremity Extremity Extremity Cataracts, Sleep Apnea, Congestive Cataracts, Sleep Apnea, Congestive Cataracts, Sleep Apnea, Congestive Comorbid History: Heart Failure, Hypertension, Heart Failure, Hypertension, Heart Failure, Hypertension, Corey [name], Corey [name] ([id number]) [phone].pdf Page 3 of 9  Myocardial Infarction, Type II Myocardial Infarction, Type II Myocardial Infarction, Type II Diabetes, Gout Diabetes, Gout Diabetes, Gout 08/15/2021 06/19/2022 06/26/2022 Date Acquired: 67 1 0 Weeks of Treatment: Healed - Epithelialized Healed - Epithelialized Open Wound Status: No No No Wound Recurrence: 0x0x0 0x0x0 1.4x0.4x0.1 Measurements L x W x D (cm) 0 0 0.44 A (cm) : rea 0 0 0.044 Volume (cm) : 100.00% 100.00% N/A % Reduction in A rea: 100.00% 100.00% N/A % Reduction in Volume: Grade 2 Grade 2 Grade 2 Classification: Medium Medium Medium Exudate A Corey: Serosanguineous Serosanguineous Serosanguineous Exudate Type: red, brown red, brown red, brown Exudate Color: Distinct, outline attached Distinct, outline attached N/A Wound Margin: None Present (0%) Large (67-100%) Large (67-100%) Granulation A Corey: N/A Red Red Granulation Quality: None Present (0%) Small (1-33%) Small (1-33%) Necrotic A Corey: N/A Adherent Slough Eschar, Adherent Slough Necrotic Tissue: Fat Layer (Subcutaneous Tissue): Yes Fat Layer (Subcutaneous Tissue): Yes Fat Layer (Subcutaneous Tissue): Yes Exposed Structures: Fascia: No Fascia:  No Fascia: No Tendon: No Tendon: No Tendon: No Muscle: No Muscle: No Muscle: No Joint: No Joint: No Joint: No Bone: No Bone: No Bone: No Large (67-100%) None None Epithelialization: N/A N/A Debridement - Excisional Debridement: Pre-procedure Verification/Time Out N/A N/A 13:39 Taken: N/A N/A Lidocaine 4% Topical Solution Pain Control: N/A N/A Callus, Subcutaneous, Slough Tissue Debrided: N/A N/A Skin/Subcutaneous Tissue Level: N/A N/A 0.56 Debridement A (sq cm): rea N/A N/A Curette Instrument: N/A N/A Minimum Bleeding: N/A N/A Pressure Hemostasis A chieved: N/A N/A 0 Procedural Pain: N/A N/A 0 Post Procedural Pain: N/A N/A Procedure was tolerated well Debridement Treatment Response: N/A N/A 1.4x0.4x0.1 Post Debridement Measurements L x W x D (cm) N/A N/A 0.044 Post Debridement Volume: (cm) Scarring: Yes Periwound Skin Texture: Excoriation: No Induration: No Callus: No Crepitus: No Rash: No Maceration: No Periwound Skin Moisture: Dry/Scaly: No Atrophie Blanche: No Periwound Skin Color: Cyanosis: No Ecchymosis: No Erythema: No Hemosiderin Staining: No Mottled: No Pallor: No Rubor: No No Abnormality N/A N/A Temperature: Yes N/A N/A Tenderness on Palpation: N/A N/A Debridement Procedures Performed: Treatment Notes Electronic Signature(s) Signed: 06/26/2022 3:45:51 PM By: Kalman Shan DO Entered By: Kalman Shan on 06/26/2022 13:45:29 -------------------------------------------------------------------------------- Multi-Disciplinary Care Plan Details Patient Name: Date of Service: Corey [name], Corey [name]. 06/26/2022 1:15 PM Medical Record Number: [id number] Patient Account Number: [id number] Date of Birth/Sex: Treating RN: 1946/05/06 (77 y.o. [place], [place], [place] [name] ([id number]) [id number] Page 4 of 9 Primary Care Amarachi Kotz: Billey Gosling Other Clinician: Referring Armon Orvis: Treating Corlette Ciano/Extender:  Mickle Asper in Treatment: 29 Active Inactive Necrotic Tissue Nursing Diagnoses: Knowledge deficit related to management of necrotic/devitalized tissue Goals: Necrotic/devitalized tissue will be minimized in the wound bed Date Initiated: 11/01/2021 Target Resolution Date: 07/14/2022 Goal Status: Active Patient/caregiver will verbalize understanding of reason and process for debridement of necrotic tissue Date Initiated: 11/01/2021 Date Inactivated: 12/05/2021 Target Resolution Date: 12/09/2021 Goal Status: Met Interventions: Assess patient pain level pre-, during and post procedure and prior to discharge Provide education on necrotic tissue and debridement process Treatment Activities: Apply topical anesthetic as ordered : 11/01/2021 Enzymatic debridement : 11/01/2021 Excisional debridement : 11/01/2021 Notes: Pain, Acute or Chronic Nursing Diagnoses: Pain, acute or chronic: actual or potential Potential alteration in comfort, pain Goals: Patient will verbalize adequate pain control and receive pain control interventions during procedures as needed Date Initiated: 11/01/2021 Date Inactivated: 11/21/2021 Target Resolution Date: 12/09/2021 Goal Status: Met Patient/caregiver will verbalize comfort level met Date Initiated: 11/01/2021 Target Resolution Date:  07/14/2022 Goal Status: Active Interventions: Complete pain assessment as per visit requirements Provide education on pain management Treatment Activities: Administer pain control measures as ordered : 11/01/2021 Notes: Electronic Signature(s) Signed: 06/26/2022 4:13:25 PM By: Sharyn Creamer RN, BSN Entered By: Sharyn Creamer on 06/26/2022 13:35:51 -------------------------------------------------------------------------------- Pain Assessment Details Patient Name: Date of Service: Corey [name], Corey [name]. 06/26/2022 1:15 PM Medical Record Number: [id number] Patient Account Number: [id number] Date of Birth/Sex:  Treating RN: 1945/11/04 (77 y.o. [name]) Primary Care Keyanni Whittinghill: Billey Gosling Other Clinician: Referring Retina Bernardy: Treating Brayan Votaw/Extender: Mickle Asper in Treatment: [address], Corey [name] ([id number]) [id number] Page 5 of 9 Active Problems Location of Pain Severity and Description of Pain Patient Has Paino No Site Locations Pain Management and Medication Current Pain Management: Electronic Signature(s) Signed: 06/26/2022 4:13:33 PM By: Erenest Blank Entered By: Erenest Blank on 06/26/2022 13:23:28 -------------------------------------------------------------------------------- Patient/Caregiver Education Details Patient Name: Date of Service: Corey [name] 2/12/2024andnbsp1:15 PM Medical Record Number: [id number] Patient Account Number: [id number] Date of Birth/Gender: Treating RN: Sep 22, 1945 (77 y.o. Mare Ferrari Primary Care Physician: Billey Gosling Other Clinician: Referring Physician: Treating Physician/Extender: Mickle Asper in Treatment: 38 Education Assessment Education Provided To: Patient Education Topics Provided Wound/Skin Impairment: Methods: Explain/Verbal Responses: State content correctly [employer]) Signed: 06/26/2022 4:13:25 PM By: Sharyn Creamer RN, BSN Entered By: Sharyn Creamer on 06/26/2022 13:36:07 Corey [name] ([id number].pdf Page 6 of 9 -------------------------------------------------------------------------------- Wound Assessment Details Patient Name: Date of Service: Corey [name]. 06/26/2022 1:15 PM Medical Record Number: [id number] Patient Account Number: [id number] Date of Birth/Sex: Treating RN: 11-21-1945 (77 y.o. Mare Ferrari Primary Care Lisia Westbay: Billey Gosling Other Clinician: Referring Tywanda Rice: Treating Seleni Meller/Extender: [name] in Treatment: 33 Wound Status Wound Number: 1  Primary Diabetic Wound/Ulcer of the Lower Extremity Etiology: Wound Location: Right, Lateral Lower Leg Wound Healed - Epithelialized Wounding Event: Gradually Appeared Status: Date Acquired: 08/15/2021 Comorbid Cataracts, Sleep Apnea, Congestive Heart Failure, Hypertension, Weeks Of Treatment: 33 History: Myocardial Infarction, Type II Diabetes, Gout Clustered Wound: No Photos Wound Measurements Length: (cm) Width: (cm) Depth: (cm) Area: (cm) Volume: (cm) 0 % Reduction in Area: 100% 0 % Reduction in Volume: 100% 0 Epithelialization: Large (67-100%) 0 Tunneling: No 0 Undermining: No Wound Description Classification: Grade 2 Wound Margin: Distinct, outline attached Exudate Amount: Medium Exudate Type: Serosanguineous Exudate Color: red, brown Foul Odor After Cleansing: No Slough/Fibrino Yes Wound Bed Granulation Amount: None Present (0%) Exposed Structure Necrotic Amount: None Present (0%) Fascia Exposed: No Fat Layer (Subcutaneous Tissue) Exposed: Yes Tendon Exposed: No Muscle Exposed: No Joint Exposed: No Bone Exposed: No Periwound Skin Texture Texture Color No Abnormalities Noted: No No Abnormalities Noted: No Callus: No Atrophie Blanche: No Crepitus: No Cyanosis: No Excoriation: No Ecchymosis: No Induration: No Erythema: No Rash: No Hemosiderin Staining: No Scarring: Yes Mottled: No Pallor: No Moisture Rubor: No No Abnormalities Noted: No Dry / Scaly: No Temperature / Pain Maceration: No Temperature: No Abnormality Tenderness on Palpation: Yes Electronic Signature(s) Signed: 06/26/2022 4:13:25 PM By: Sharyn Creamer RN, BSN Entered By: Sharyn Creamer on 06/26/2022 13:40:04 Corey [name] ([id number].pdf Page 7 of 9 -------------------------------------------------------------------------------- Wound Assessment Details Patient Name: Date of Service: Corey [name]. 06/26/2022 1:15 PM Medical Record Number:  [id number] Patient Account Number: [id number] Date of Birth/Sex: Treating RN: [birth year] (77 y.o. [name]) Primary Care Angell Pincock: Billey Gosling Other Clinician: Referring Monroe Qin: Treating Darcel Zick/Extender: [name] in Treatment:  33 Wound Status Wound Number: 2 Primary Diabetic Wound/Ulcer of the Lower Extremity Etiology: Wound Location: Left, Anterior Lower Leg Wound Healed - Epithelialized Wounding Event: Gradually Appeared Status: Date Acquired: 06/19/2022 Comorbid Cataracts, Sleep Apnea, Congestive Heart Failure, Hypertension, Weeks Of Treatment: 1 History: Myocardial Infarction, Type II Diabetes, Gout Clustered Wound: No Photos Wound Measurements Length: (cm) Width: (cm) Depth: (cm) Area: (cm) Volume: (cm) 0 % Reduction in Area: 100% 0 % Reduction in Volume: 100% 0 Epithelialization: None 0 0 Wound Description Classification: Grade 2 Wound Margin: Distinct, outline attached Exudate Amount: Medium Exudate Type: Serosanguineous Exudate Color: red, brown Foul Odor After Cleansing: No Slough/Fibrino Yes Wound Bed Granulation Amount: Large (67-100%) Exposed Structure Granulation Quality: Red Fascia Exposed: No Necrotic Amount: Small (1-33%) Fat Layer (Subcutaneous Tissue) Exposed: Yes Necrotic Quality: Adherent Slough Tendon Exposed: No Muscle Exposed: No Joint Exposed: No Bone Exposed: No Periwound Skin Texture Texture Color No Abnormalities Noted: No No Abnormalities Noted: No Moisture No Abnormalities Noted: No Electronic Signature(s) Signed: 06/26/2022 4:13:33 PM By: Erenest Blank Entered By: Erenest Blank on 06/26/2022 13:33:04 Corey [name] ([id number].pdf Page 8 of 9 -------------------------------------------------------------------------------- Wound Assessment Details Patient Name: Date of Service: Corey [name]. 06/26/2022 1:15 PM Medical Record Number: [id number] Patient Account  Number: [id number] Date of Birth/Sex: Treating RN: Jun 09, 1945 (77 y.o. [name]) Primary Care Medina Degraffenreid: Billey Gosling Other Clinician: Referring Amma Crear: Treating Corey [name]/Extender: [name] in Treatment: 33 Wound Status Wound Number: 3 Primary Diabetic Wound/Ulcer of the Lower Extremity Etiology: Wound Location: Left Calcaneus Wound Open Wounding Event: Skin Tear/Laceration Status: Date Acquired: 06/26/2022 Comorbid Cataracts, Sleep Apnea, Congestive Heart Failure, Hypertension, Weeks Of Treatment: 0 History: Myocardial Infarction, Type II Diabetes, Gout Clustered Wound: No Photos Wound Measurements Length: (cm) 1.4 Width: (cm) 0.4 Depth: (cm) 0.1 Area: (cm) 0.44 Volume: (cm) 0.044 % Reduction in Area: % Reduction in Volume: Epithelialization: None Tunneling: No Undermining: No Wound Description Classification: Grade 2 Exudate Amount: Medium Exudate Type: Serosanguineous Exudate Color: red, brown Foul Odor After Cleansing: No Slough/Fibrino Yes Wound Bed Granulation Amount: Large (67-100%) Exposed Structure Granulation Quality: Red Fascia Exposed: No Necrotic Amount: Small (1-33%) Fat Layer (Subcutaneous Tissue) Exposed: Yes Necrotic Quality: Eschar, Adherent Slough Tendon Exposed: No Muscle Exposed: No Joint Exposed: No Bone Exposed: No Periwound Skin Texture Texture Color No Abnormalities Noted: No No Abnormalities Noted: No Moisture No Abnormalities Noted: No Treatment Notes Wound #3 (Calcaneus) Wound Laterality: Left Cleanser Wound Cleanser Discharge Instruction: Cleanse the wound with wound cleanser prior to applying a clean dressing using gauze sponges, not tissue or cotton balls. Corey [name], Corey [name] ([id number]) [id number] Page 9 of 9 Peri-Wound Care Topical Primary Dressing Hydrofera Blue Ready Transfer Foam, 2.5x2.5 (in/in) Discharge Instruction: Apply directly to wound bed as directed MediHoney Gel,  tube 1.5 (oz) Discharge Instruction: Apply to wound bed as instructed Secondary Dressing ALLEVYN Heel 4 1/2in x 5 1/2in / 10.5cm x 13.5cm Discharge Instruction: Apply over primary dressing as directed. Woven Gauze Sponge, Non-Sterile 4x4 in Discharge Instruction: Apply over primary dressing as directed. Secured With [employer], 4.5x3.1 (in/yd) Discharge Instruction: Secure with Kerlix as directed. 68M Medipore H Soft Cloth Surgical T ape, 4 x 10 (in/yd) Discharge Instruction: Secure with tape as directed. Compression Wrap Compression Stockings Add-Ons Electronic Signature(s) Signed: 06/26/2022 4:13:33 PM By: Erenest Blank Entered By: Erenest Blank on 06/26/2022 13:35:20 -------------------------------------------------------------------------------- Vitals Details Patient Name: Date of Service: Corey [name], Corey [name]. 06/26/2022 1:15 PM Medical Record Number: [id number] Patient Account  Number: [id number] Date of Birth/Sex: Treating RN: 1946-04-16 (77 y.o. [name]) Primary Care Tresea Heine: Billey Gosling Other Clinician: Referring Levoy Geisen: Treating Jessaca Philippi/Extender: Mickle Asper in Treatment: 11 Vital Signs Time Taken: 13:22 Temperature (F): 97.6 Height (in): 66 Pulse (bpm): 55 Weight (lbs): 145 Respiratory Rate (breaths/min): 18 Body Mass Index (BMI): 23.4 Blood Pressure (mmHg): 144/78 Reference Range: 80 - 120 mg / dl Electronic Signature(s) Signed: 06/26/2022 4:13:33 PM By: Erenest Blank Entered By: Erenest Blank on 06/26/2022 13:23:22

## 2022-06-26 NOTE — Anesthesia Postprocedure Evaluation (Signed)
Anesthesia Post Note  Patient: Corey [name]  Procedure(s) Performed: A-FLUTTER ABLATION TRANSESOPHAGEAL ECHOCARDIOGRAM (TEE)     Patient location during evaluation: PACU Anesthesia Type: General Level of consciousness: awake and alert Pain management: pain level controlled Vital Signs Assessment: post-procedure vital signs reviewed and stable Respiratory status: spontaneous breathing, nonlabored ventilation and respiratory function stable Cardiovascular status: blood pressure returned to baseline and stable Postop Assessment: no apparent nausea or vomiting Anesthetic complications: no   There were no known notable events for this encounter.  Last Vitals:  Vitals:   06/23/22 1600 06/23/22 1700  BP: 129/89 134/89  Pulse: 98 73  Resp: 19 18  Temp:    SpO2:  100%    Last Pain:  Vitals:   06/26/22 1010  TempSrc:   PainSc: 0-No pain   Pain Goal:                   Lynda Rainwater

## 2022-06-27 ENCOUNTER — Other Ambulatory Visit: Payer: Self-pay | Admitting: Cardiology

## 2022-06-27 ENCOUNTER — Other Ambulatory Visit: Payer: Self-pay

## 2022-06-27 ENCOUNTER — Other Ambulatory Visit (HOSPITAL_COMMUNITY): Payer: Self-pay

## 2022-06-27 MED ORDER — HYDRALAZINE HCL 25 MG PO TABS: 25.0000 mg | ORAL_TABLET | Freq: Three times a day (TID) | ORAL | 0 refills | Status: DC

## 2022-06-27 NOTE — Telephone Encounter (Signed)
Spoke with wife, [name].  She reports she recently had his Torsemide 20 mg (2) tablets twice daily refilled locally.  She does not need the RX for the 40 mg tablets at this time and will continue with the 20 mg tablets.  The bottles she has on hand has refills.

## 2022-06-27 NOTE — Telephone Encounter (Signed)
Pt's pharmacy is requesting a change, Pharmacy comment: [facility] 40MG ONLY AVAILABLE AS **BRAND** NO GENERIC TORSEMIDE 40 MG AVAILABLE. MD MAY CHANGE TO FORMULARY DRUG, ALSO TORSEMIDE 20MG AVAILABLE IF MD WANTS TO CHANGE TO 2 TABS.  Please address

## 2022-06-28 DIAGNOSIS — S81802A Unspecified open wound, left lower leg, initial encounter: Secondary | ICD-10-CM | POA: Diagnosis not present

## 2022-06-29 ENCOUNTER — Other Ambulatory Visit (HOSPITAL_COMMUNITY): Payer: Self-pay

## 2022-07-03 ENCOUNTER — Ambulatory Visit: Payer: Medicare HMO | Attending: Internal Medicine

## 2022-07-03 DIAGNOSIS — I5042 Chronic combined systolic (congestive) and diastolic (congestive) heart failure: Secondary | ICD-10-CM

## 2022-07-03 DIAGNOSIS — Z79899 Other long term (current) drug therapy: Secondary | ICD-10-CM | POA: Diagnosis not present

## 2022-07-03 DIAGNOSIS — R600 Localized edema: Secondary | ICD-10-CM

## 2022-07-03 LAB — BASIC METABOLIC PANEL
BUN/Creatinine Ratio: 28 — ABNORMAL HIGH (ref 10–24)
BUN: 97 mg/dL (ref 8–27)
CO2: 33 mmol/L — ABNORMAL HIGH (ref 20–29)
Calcium: 10 mg/dL (ref 8.6–10.2)
Chloride: 90 mmol/L — ABNORMAL LOW (ref 96–106)
Creatinine, Ser: 3.52 mg/dL — ABNORMAL HIGH (ref 0.76–1.27)
Glucose: 122 mg/dL — ABNORMAL HIGH (ref 70–99)
Potassium: 3.3 mmol/L — ABNORMAL LOW (ref 3.5–5.2)
Sodium: 141 mmol/L (ref 134–144)
eGFR: 17 mL/min/{1.73_m2} — ABNORMAL LOW (ref >59)

## 2022-07-04 ENCOUNTER — Encounter (HOSPITAL_BASED_OUTPATIENT_CLINIC_OR_DEPARTMENT_OTHER): Payer: Medicare HMO | Admitting: Internal Medicine

## 2022-07-04 DIAGNOSIS — L97918 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 with other specified severity: Secondary | ICD-10-CM | POA: Diagnosis not present

## 2022-07-04 DIAGNOSIS — I87313 Chronic venous hypertension (idiopathic) with ulcer of bilateral lower extremity: Secondary | ICD-10-CM | POA: Diagnosis not present

## 2022-07-04 DIAGNOSIS — E11621 Type 2 diabetes mellitus with foot ulcer: Secondary | ICD-10-CM

## 2022-07-04 DIAGNOSIS — N184 Chronic kidney disease, stage 4 (severe): Secondary | ICD-10-CM | POA: Diagnosis not present

## 2022-07-04 DIAGNOSIS — I130 Hypertensive heart and chronic kidney disease with heart failure and stage 1 through stage 4 chronic kidney disease, or unspecified chronic kidney disease: Secondary | ICD-10-CM | POA: Diagnosis not present

## 2022-07-04 DIAGNOSIS — S91302A Unspecified open wound, left foot, initial encounter: Secondary | ICD-10-CM | POA: Diagnosis not present

## 2022-07-04 DIAGNOSIS — S81802A Unspecified open wound, left lower leg, initial encounter: Secondary | ICD-10-CM | POA: Diagnosis not present

## 2022-07-04 DIAGNOSIS — E11622 Type 2 diabetes mellitus with other skin ulcer: Secondary | ICD-10-CM | POA: Diagnosis not present

## 2022-07-04 DIAGNOSIS — E1122 Type 2 diabetes mellitus with diabetic chronic kidney disease: Secondary | ICD-10-CM | POA: Diagnosis not present

## 2022-07-05 NOTE — Progress Notes (Signed)
Corey, [name] ([id number]) [id number] Page 1 of 9 Visit Report for 07/04/2022 Chief Complaint Document Details Patient Name: Date of Service: Corey [name] 07/04/2022 2:00 PM Medical Record Number: [id number] Patient Account Number: [id number] Date of Birth/Sex: Treating RN: 1945-07-14 (77 y.o. M) Primary Care Provider: Piedad Climes, [place] U[place] Other Clinician: Referring Provider: Treating Provider/Extender: Mickle Asper in Treatment: 35 Information Obtained from: Patient Chief Complaint 11/01/2021; right lower extremity wound Electronic Signature(s) Signed: 07/04/2022 3:41:53 PM By: Kalman Shan DO Entered By: Kalman Shan on 07/04/2022 15:16:49 -------------------------------------------------------------------------------- HPI Details Patient Name: Date of Service: Corey [name], [name] 07/04/2022 2:00 PM Medical Record Number: [id number] Patient Account Number: [id number] Date of Birth/Sex: Treating RN: 16-Sep-1945 (77 y.o. M) Primary Care Provider: Piedad Climes, [place] U[place] Other Clinician: Referring Provider: Treating Provider/Extender: Mickle Asper in Treatment: 35 History of Present Illness HPI Description: Admission 11/01/2021 [name] Is a 77 year old male with a past medical history of of type 2 diabetes, coronary artery disease, stage IV kidney disease acute on chronic combined systolic and diastolic heart failure that presents to the clinic for a 48-monthhistory of right lower extremity wound. He states he is not sure how this started. It is completely eschared. He reports his wound has been stable for the past month. He has been keeping the area covered. He has mild chronic pain. He denies signs of infection. He states he has a history of lower extremity ulcers last being on the left leg. He could not tell me how this was treated in the past. 6/27; patient presents for follow-up. He is scheduled for his lower  extremity arterial studies on July 6th. He has been using Medihoney to the wound bed. He has no issues or complaints today. He denies signs of infection. 7/6; patient presents for follow-up. He has home health that reported the wound having odor and increased pain last week. He was sent to urgent care. He was started on doxycycline. He is continuing Medihoney. He has his scheduled ABIs w/TBIs today. 7/10; patient presents for follow-up. He had his arterial studies done last week. He had triphasic waveforms throughout the right lower extremity. His TBI was 0.64. He has good pedal pulses. He is almost done with his course of antibiotics. He has been using mupirocin ointment to the wound bed. 7/17; patient presents for follow-up. He has been using Iodosorb under compression therapy. He has no issues or complaints today. He states he is receiving Keystone antibiotic tomorrow In the mail. 7/24; patient presents for follow-up. He has been doing Dakin's wet-to-dry dressings to the wound bed daily without issues. He currently denies signs of infection. He reports completing his course of doxycycline. He has his Keystone antibiotic with him today. 7/31; patient presents for follow-up. We have [facility] He has no issues or complaints today. He denies signs of infection. 8/7; patient presents for follow-up. [facility] Home health came out once last week to change the wrap. He has no issues or complaints today. He denies signs of infection. 8/14; patient presents for follow-up. We have been using Keystone with Hydrofera Blue under compression therapy. He has tolerated this well. He has no issues or complaints today. [name], [name]([id number] Page 2 of 9 8/21; patient presents for follow-up. We have been using Keystone with Hydrofera Blue under compression therapy.  [facility]  was stuck on tightly to the wound bed today. Other than that he has no issues or complaints. We have not heard back for insurance approval for skin sub. 8/28; patient presents for follow-up. We ordered blast X and collagen powder at last clinic visit. It is unclear if he has received this. We placed Keystone with Fibracol under 3 layer compression last week. Patient has home health. Patient has no issues or complaints today. He was not approved for TheraSkin. 9/5; patient presents for follow-up. Insurance would not cover blast X. We do have a sample and he would like to try this. We have been using Keystone antibiotic and collagen under 3 layer compression. He has no issues or complaints today. 9/18; patient presents for follow-up. We have been using collagen with blast X and Keystone under compression therapy. He has no issues or complaints today. 9/25; patient presents for follow-up. We have continued to use collagen, blast X and Keystone under compression therapy. Patient has no issues or complaints today. 10/6; patient presents for follow-up. We have been using collagen, blast X and Keystone under compression therapy. He denies signs of infection and has no issues or complaints today. Patient has home health that comes out on Wednesdays and Fridays. 10/16; patient presents for follow-up. He has been using collagen, blast X and Keystone with benefit to the wound bed under compression therapy. He is about to run out of [facility] antibiotic ointment. He has no issues or complaints today. 10/23; patient presents for follow-up. We continue to use collagen, blast X and Keystone under compression therapy. Continued report in wound healing. Home health continues to come out to do dressing changes. Patient has no issues or complaints today. 10/30; patient presents for follow-up. We have been using collagen, blast X and Keystone antibiotic under compression therapy. His wound continues to  show improvement in healing. He has no issues or complaints today. 11/30; patient was last seen 1 month ago. He has missed his last clinic visit. He has home health who changes the dressings 3 times weekly. He has been using collagen with Keystone antibiotic under 3 layer compression. He has run out of [place] antibiotic ointment. The wound is almost healed. 12/18; patient was last seen 3 weeks ago. We have been using collagen with Keystone antibiotic under 3 layer compression. Home health has been coming out twice a week. He has no issues or complaints today. 1/4; patient presents for follow-up. We have been using collagen under 3 layer compression to the right lower extremity. Home health has been coming out twice weekly to change the dressing. He states the wrap slid down this past week. 1/18; patient presents for follow-up. We have been using Hydrofera Blue under 3 layer compression to the right lower extremity. Home health is discharging the patient. He has no issues or complaints today. 1/26; patient presents for follow-up. We have been using Hydrofera Blue under 3 layer compression to the right lower extremity. He states his diuretic was increased. He has no issues or complaints today. 2/5; patient presents for follow-up. We have been using endoform under 3 layer compression to the right lower extremity. Unfortunately has developed a new wound to the left lower extremity. This started spontaneously over the past week. He was recently started on a new diuretic. He denies signs of infection. 2/12; patient presents for follow-up. We have been using endoform under 3 layer compression to the lower extremities bilaterally. His wounds of healed. Unfortunately has developed a new wound to the  left heel. He is not quite sure how this happened. He denies signs of infection. 2/20; patient presents for follow-up. He has been using Medihoney and Hydrofera Blue to the left heel. This area is almost closed.  He has no issues or complaints today. Electronic Signature(s) Signed: 07/04/2022 3:41:53 PM By: Kalman Shan DO Entered By: Kalman Shan on 07/04/2022 15:17:14 -------------------------------------------------------------------------------- Physical Exam Details Patient Name: Date of Service: Corey [name], [name] 07/04/2022 2:00 PM Medical Record Number: [id number] Patient Account Number: [id number] Date of Birth/Sex: Treating RN: [birth year] (77 y.o. M) Primary Care Provider: Piedad Climes, [place] U[place] Other Clinician: Referring Provider: Treating Provider/Extender: Mickle Asper in Treatment: 35 Constitutional respirations regular, non-labored and within target range for patient.. Cardiovascular 2+ dorsalis pedis/posterior tibialis pulses. Psychiatric pleasant and cooperative. Notes T the left heel there is a small wound with granulation tissue present. Epithelization to the edges circumferentially. No signs of infection. TERIC, BOGDA ([id number]) [id number] Page 3 of 9 Electronic Signature(s) Signed: 07/04/2022 3:41:53 PM By: Kalman Shan DO Entered By: Kalman Shan on 07/04/2022 15:17:56 -------------------------------------------------------------------------------- Physician Orders Details Patient Name: Date of Service: Corey [name], [name] 07/04/2022 2:00 PM Medical Record Number: [id number] Patient Account Number: [id number] Date of Birth/Sex: Treating RN: [birth year] (77 y.o. Mare Ferrari Primary Care Provider: Piedad Climes, [place] ULA Other Clinician: Referring Provider: Treating Provider/Extender: Mickle Asper in Treatment: 25 Verbal / Phone Orders: No Diagnosis Coding Follow-up Appointments ppointment in 1 week. - Dr. Heber Caraway Room 9 Tuesday 12:45 07/11/22 Return A Other: - Speak with cardiology about fluid build up noted in legs. Anesthetic (In clinic) Topical Lidocaine 5% applied to wound bed (In  clinic) Topical Lidocaine 4% applied to wound bed Cellular or Tissue Based Products Cellular or Tissue Based Product Type: - run insurance for [employer], epicord, and organogenesis advance tissue product. Theraskin not approved. Epicord not covered. Organ[employer] Apligraf approved. Edema Control - Lymphedema / SCD / Other Avoid standing for long periods of time. Patient to wear own compression stockings every day. - We will apply a tubi grip size E double folded today [facility] for wound care. - [place] Wound Treatment Wound #3 - Calcaneus Wound Laterality: Left Cleanser: Wound Cleanser 1 x Per Day/15 Days Discharge Instructions: Cleanse the wound with wound cleanser prior to applying a clean dressing using gauze sponges, not tissue or cotton balls. Prim Dressing: Hydrofera Blue Ready Transfer Foam, 2.5x2.5 (in/in) 1 x Per Day/15 Days ary Discharge Instructions: Apply directly to wound bed as directed Prim Dressing: MediHoney Gel, tube 1.5 (oz) 1 x Per Day/15 Days ary Discharge Instructions: Apply to wound bed as instructed Secondary Dressing: ALLEVYN Heel 4 1/2in x 5 1/2in / 10.5cm x 13.5cm (Generic) 1 x Per Day/15 Days Discharge Instructions: Apply over primary dressing as directed. Secondary Dressing: Woven Gauze Sponge, Non-Sterile 4x4 in (Generic) 1 x Per Day/15 Days Discharge Instructions: Apply over primary dressing as directed. Secured With: [employer], 4.5x3.1 (in/yd) (Generic) 1 x Per Day/15 Days Discharge Instructions: Secure with Kerlix as directed. Secured With: 27M Medipore H Soft Cloth Surgical T ape, 4 x 10 (in/yd) (Generic) 1 x Per Day/15 Days Discharge Instructions: Secure with tape as directed. Electronic Signature(s) Signed: 07/04/2022 3:41:53 PM By: Kalman Shan DO Entered By: Kalman Shan on 07/04/2022 15:18:04 [name] ([id number]) [id number] Page 4 of  9 -------------------------------------------------------------------------------- Problem List Details Patient Name: Date of Service: Corey O NCE, RO Y  M. 07/04/2022 2:00 PM Medical Record Number: [id number] Patient Account Number: [id number] Date of Birth/Sex: Treating RN: 1945/06/16 (77 y.o. M) Primary Care Provider: Piedad Climes, PA ULA Other Clinician: Referring Provider: Treating Provider/Extender: Mickle Asper in Treatment: 31 Active Problems ICD-10 Encounter Code Description Active Date MDM Diagnosis L97.918 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 with other 11/01/2021 No Yes specified severity E11.622 Type 2 diabetes mellitus with other skin ulcer 11/01/2021 No Yes I50.42 Chronic combined systolic (congestive) and diastolic (congestive) heart failure 11/01/2021 No Yes I25.119 Atherosclerotic heart disease of native coronary artery with unspecified angina 11/01/2021 No Yes pectoris I87.311 Chronic venous hypertension (idiopathic) with ulcer of right lower extremity 12/26/2021 No Yes I87.312 Chronic venous hypertension (idiopathic) with ulcer of left lower extremity 06/19/2022 No Yes S81.802A Unspecified open wound, left lower leg, initial encounter 06/19/2022 No Yes S91.302A Unspecified open wound, left foot, initial encounter 06/26/2022 No Yes E11.621 Type 2 diabetes mellitus with foot ulcer 06/26/2022 No Yes Inactive Problems Resolved Problems Electronic Signature(s) Signed: 07/04/2022 3:41:53 PM By: Kalman Shan DO Entered By: Kalman Shan on 07/04/2022 15:16:38 [name] ([id number]) [id number] Page 5 of 9 -------------------------------------------------------------------------------- Progress Note Details Patient Name: Date of Service: Corey [name]. 07/04/2022 2:00 PM Medical Record Number: [id number] Patient Account Number: [id number] Date of Birth/Sex: Treating RN: Jan 04, 1946 (77 y.o. M) Primary  Care Provider: Piedad Climes, PA ULA Other Clinician: Referring Provider: Treating Provider/Extender: Mickle Asper in Treatment: 50 Subjective Chief Complaint Information obtained from Patient 11/01/2021; right lower extremity wound History of Present Illness (HPI) Admission 11/01/2021 Mr. Mio Holan Is a 77 year old male with a past medical history of of type 2 diabetes, coronary artery disease, stage IV kidney disease acute on chronic combined systolic and diastolic heart failure that presents to the clinic for a 56-monthhistory of right lower extremity wound. He states he is not sure how this started. It is completely eschared. He reports his wound has been stable for the past month. He has been keeping the area covered. He has mild chronic pain. He denies signs of infection. He states he has a history of lower extremity ulcers last being on the left leg. He could not tell me how this was treated in the past. 6/27; patient presents for follow-up. He is scheduled for his lower extremity arterial studies on July 6th. He has been using Medihoney to the wound bed. He has no issues or complaints today. He denies signs of infection. 7/6; patient presents for follow-up. He has home health that reported the wound having odor and increased pain last week. He was sent to urgent care. He was started on doxycycline. He is continuing Medihoney. He has his scheduled ABIs w/TBIs today. 7/10; patient presents for follow-up. He had his arterial studies done last week. He had triphasic waveforms throughout the right lower extremity. His TBI was 0.64. He has good pedal pulses. He is almost done with his course of antibiotics. He has been using mupirocin ointment to the wound bed. 7/17; patient presents for follow-up. He has been using Iodosorb under compression therapy. He has no issues or complaints today. He states he is receiving Keystone antibiotic tomorrow In the mail. 7/24; patient  presents for follow-up. He has been doing Dakin's wet-to-dry dressings to the wound bed daily without issues. He currently denies signs of infection. He reports completing his course of doxycycline. He has his Keystone antibiotic with him today. 7/31; patient presents for  follow-up. We have been using Iodoflex with [facility] under compression therapy. He has no issues or complaints today. He denies signs of infection. 8/7; patient presents for follow-up. We have been using Iodoflex with [facility] under compression therapy. Home health came out once last week to change the wrap. He has no issues or complaints today. He denies signs of infection. 8/14; patient presents for follow-up. We have been using Keystone with Hydrofera Blue under compression therapy. He has tolerated this well. He has no issues or complaints today. 8/21; patient presents for follow-up. We have been using Keystone with Hydrofera Blue under compression therapy. The Hydrofera Blue was stuck on tightly to the wound bed today. Other than that he has no issues or complaints. We have not heard back for insurance approval for skin sub. 8/28; patient presents for follow-up. We ordered blast X and collagen powder at last clinic visit. It is unclear if he has received this. We placed Keystone with Fibracol under 3 layer compression last week. Patient has home health. Patient has no issues or complaints today. He was not approved for TheraSkin. 9/5; patient presents for follow-up. Insurance would not cover blast X. We do have a sample and he would like to try this. We have been using Keystone antibiotic and collagen under 3 layer compression. He has no issues or complaints today. 9/18; patient presents for follow-up. We have been using collagen with blast X and Keystone under compression therapy. He has no issues or complaints today. 9/25; patient presents for follow-up. We have continued to use collagen, blast X and Keystone under  compression therapy. Patient has no issues or complaints today. 10/6; patient presents for follow-up. We have been using collagen, blast X and Keystone under compression therapy. He denies signs of infection and has no issues or complaints today. Patient has home health that comes out on Wednesdays and Fridays. 10/16; patient presents for follow-up. He has been using collagen, blast X and Keystone with benefit to the wound bed under compression therapy. He is about to run out of [facility] antibiotic ointment. He has no issues or complaints today. 10/23; patient presents for follow-up. We continue to use collagen, blast X and Keystone under compression therapy. Continued report in wound healing. Home health continues to come out to do dressing changes. Patient has no issues or complaints today. 10/30; patient presents for follow-up. We have been using collagen, blast X and Keystone antibiotic under compression therapy. His wound continues to show improvement in healing. He has no issues or complaints today. 11/30; patient was last seen 1 month ago. He has missed his last clinic visit. He has home health who changes the dressings 3 times weekly. He has been using collagen with Keystone antibiotic under 3 layer compression. He has run out of [place] antibiotic ointment. The wound is almost healed. 12/18; patient was last seen 3 weeks ago. We have been using collagen with Keystone antibiotic under 3 layer compression. Home health has been coming out twice a week. He has no issues or complaints today. 1/4; patient presents for follow-up. We have been using collagen under 3 layer compression to the right lower extremity. Home health has been coming out twice weekly to change the dressing. He states the wrap slid down this past week. 1/18; patient presents for follow-up. We have been using Hydrofera Blue under 3 layer compression to the right lower extremity. Home health is discharging the patient. He has  no issues or complaints today. 1/26; patient presents for follow-up.  We have been using Hydrofera Blue under 3 layer compression to the right lower extremity. He states his diuretic was increased. He has no issues or complaints today. SAHAN, KIDDER ([id number]) [id number] Page 6 of 9 2/5; patient presents for follow-up. We have been using endoform under 3 layer compression to the right lower extremity. Unfortunately has developed a new wound to the left lower extremity. This started spontaneously over the past week. He was recently started on a new diuretic. He denies signs of infection. 2/12; patient presents for follow-up. We have been using endoform under 3 layer compression to the lower extremities bilaterally. His wounds of healed. Unfortunately has developed a new wound to the left heel. He is not quite sure how this happened. He denies signs of infection. 2/20; patient presents for follow-up. He has been using Medihoney and Hydrofera Blue to the left heel. This area is almost closed. He has no issues or complaints today. Patient History Family History Cancer - Father, Diabetes - Mother,Father, Heart Disease - Mother,Siblings, Hypertension - Mother,Father. Social History Former smoker - quit 1970, Marital Status - Married, Drug Use - No History, Caffeine Use - Moderate - coffee. Medical History Eyes Patient has history of Cataracts Respiratory Patient has history of Sleep Apnea - has CPAP does not wear Cardiovascular Patient has history of Congestive Heart Failure, Hypertension, Myocardial Infarction - 2014 Endocrine Patient has history of Type II Diabetes Musculoskeletal Patient has history of Gout Neurologic Denies history of Seizure Disorder Hospitalization/Surgery History - 06/2021 inguinal hernia repair. - cardioversion 09/2020. - ventral hernia repair 3/19. Medical A Surgical History Notes nd Constitutional Symptoms (General Health) CVA 2014  bilateral hearing loss Gastrointestinal Diverticulosis Genitourinary Renal insufficiency 20/ Oncologic Prostate Ca Objective Constitutional respirations regular, non-labored and within target range for patient.. Vitals Time Taken: 2:11 PM, Height: 66 in, Weight: 145 lbs, BMI: 23.4, Temperature: 97.5 F, Pulse: 44 bpm, Respiratory Rate: 18 breaths/min, Blood Pressure: 107/70 mmHg. Cardiovascular 2+ dorsalis pedis/posterior tibialis pulses. Psychiatric pleasant and cooperative. General Notes: T the left heel there is a small wound with granulation tissue present. Epithelization to the edges circumferentially. No signs of infection. o Integumentary (Hair, Skin) Wound #3 status is Open. Original cause of wound was Skin T ear/Laceration. The date acquired was: 06/26/2022. The wound has been in treatment 1 weeks. The wound is located on the Left Calcaneus. The wound measures 0.1cm length x 0.1cm width x 0.1cm depth; 0.008cm^2 area and 0.001cm^3 volume. There is Fat Layer (Subcutaneous Tissue) exposed. There is a medium amount of serosanguineous drainage noted. There is large (67-100%) red granulation within the wound bed. There is no necrotic tissue within the wound bed. The periwound skin appearance exhibited: Callus, Dry/Scaly. 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 Type 2 diabetes mellitus with other skin ulcer Chronic combined systolic (congestive) and diastolic (congestive) heart failure Atherosclerotic heart disease of native coronary artery with unspecified angina pectoris LAVARR, DURNIN ([id number]) [id number] Page 7 of 9 Chronic venous hypertension (idiopathic) with ulcer of right lower extremity Chronic venous hypertension (idiopathic) with ulcer of left lower extremity Unspecified open wound, left lower leg, initial encounter Unspecified open wound, left foot,  initial encounter Type 2 diabetes mellitus with foot ulcer Patient is left heel wound appears well-healing. I recommended continue with Medihoney and Hydrofera Blue and aggressive offloading. Area is almost healed. Follow-up in 1 week. Plan Follow-up Appointments: Return Appointment in 1 week. - Dr. Heber Aldan Room 9  Tuesday 12:45 07/11/22 Other: - Speak with cardiology about fluid build up noted in legs. Anesthetic: (In clinic) Topical Lidocaine 5% applied to wound bed (In clinic) Topical Lidocaine 4% applied to wound bed Cellular or Tissue Based Products: Cellular or Tissue Based Product Type: - run insurance for [employer], epicord, and organogenesis advance tissue product. Theraskin not approved. Epicord not covered. [employer] Apligraf approved. Edema Control - Lymphedema / SCD / Other: Avoid standing for long periods of time. Patient to wear own compression stockings every day. - We will apply a tubi grip size E double folded today Home Health: [facility] for wound care. - [place] #3: - Calcaneus Wound Laterality: Left Cleanser: Wound Cleanser 1 x Per Day/15 Days Discharge Instructions: Cleanse the wound with wound cleanser prior to applying a clean dressing using gauze sponges, not tissue or cotton balls. Prim Dressing: Hydrofera Blue Ready Transfer Foam, 2.5x2.5 (in/in) 1 x Per Day/15 Days ary Discharge Instructions: Apply directly to wound bed as directed Prim Dressing: MediHoney Gel, tube 1.5 (oz) 1 x Per Day/15 Days ary Discharge Instructions: Apply to wound bed as instructed Secondary Dressing: ALLEVYN Heel 4 1/2in x 5 1/2in / 10.5cm x 13.5cm (Generic) 1 x Per Day/15 Days Discharge Instructions: Apply over primary dressing as directed. Secondary Dressing: Woven Gauze Sponge, Non-Sterile 4x4 in (Generic) 1 x Per Day/15 Days Discharge Instructions: Apply over primary dressing as directed. Secured With: [employer], 4.5x3.1 (in/yd)  (Generic) 1 x Per Day/15 Days Discharge Instructions: Secure with Kerlix as directed. Secured With: 60M Medipore H Soft Cloth Surgical T ape, 4 x 10 (in/yd) (Generic) 1 x Per Day/15 Days Discharge Instructions: Secure with tape as directed. 1. Medihoney and Hydrofera Blue 2. Aggressive offloading 3. Follow-up in 1 week Electronic Signature(s) Signed: 07/04/2022 3:41:53 PM By: Kalman Shan DO Entered By: Kalman Shan on 07/04/2022 15:18:46 -------------------------------------------------------------------------------- HxROS Details Patient Name: Date of Service: Corey [name], [name] 07/04/2022 2:00 PM Medical Record Number: [id number] Patient Account Number: [id number] Date of Birth/Sex: Treating RN: [birth year] (77 y.o. M) Primary Care Provider: Piedad Climes, [place] [facility] Other Clinician: Referring Provider: Treating Provider/Extender: [name] in Treatment: 35 Constitutional Symptoms ([place]) Medical History: Past Medical History Notes: CVA 2014 bilateral hearing loss Eyes JADAVEON, BECHLER ([id number]) [id number] Page 8 of 9 Medical History: Positive for: Cataracts Respiratory Medical History: Positive for: Sleep Apnea - has CPAP does not wear Cardiovascular Medical History: Positive for: Congestive Heart Failure; Hypertension; Myocardial Infarction - 2014 Gastrointestinal Medical History: Past Medical History Notes: Diverticulosis Endocrine Medical History: Positive for: Type II Diabetes Time with diabetes: >20 years Treated with: Oral agents, Diet Blood sugar tested every day: Yes Tested : BID Genitourinary Medical History: Past Medical History Notes: Renal insufficiency 20/ Musculoskeletal Medical History: Positive for: Gout Neurologic Medical History: Negative for: Seizure Disorder Oncologic Medical History: Past Medical History Notes: Prostate Ca HBO Extended History  Items Eyes: Cataracts Immunizations Pneumococcal Vaccine: Received Pneumococcal Vaccination: No Implantable Devices No devices added Hospitalization / Surgery History Type of Hospitalization/Surgery 06/2021 inguinal hernia repair cardioversion 09/2020 ventral hernia repair 3/19 Family and Social History Cancer: Yes - Father; Diabetes: Yes - Mother,Father; Heart Disease: Yes - Mother,Siblings; Hypertension: Yes - Mother,Father; Former smoker - quit 1970; Marital Status - Married; Drug Use: No History; Caffeine Use: Moderate - coffee; Financial Concerns: No; Food, Clothing or Shelter Needs: No; Support System Lacking: No; Transportation Concerns: No Electronic Signature(s) Signed: 07/04/2022 3:41:53 PM By: Kalman Shan DO  ROEE, MANLY ([id number]) [id number] Page 9 of 9 Entered By: Kalman Shan on 07/04/2022 15:17:20 -------------------------------------------------------------------------------- SuperBill Details Patient Name: Date of Service: Corey [name] 07/04/2022 Medical Record Number: [id number] Patient Account Number: [id number] Date of Birth/Sex: Treating RN: 03-14-1946 (77 y.o. Mare Ferrari Primary Care Provider: Piedad Climes, [place] [facility] Other Clinician: Referring Provider: Treating Provider/Extender: Mickle Asper in Treatment: 35 Diagnosis Coding ICD-10 Codes Code Description [id number] 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 with other specified severity E11.622 Type 2 diabetes mellitus with other skin ulcer I50.42 Chronic combined systolic (congestive) and diastolic (congestive) heart failure I25.119 Atherosclerotic heart disease of native coronary artery with unspecified angina pectoris I87.311 Chronic venous hypertension (idiopathic) with ulcer of right lower extremity I87.312 Chronic venous hypertension (idiopathic) with ulcer of left lower extremity S81.802A Unspecified open wound, left  lower leg, initial encounter S91.302A Unspecified open wound, left foot, initial encounter E11.621 Type 2 diabetes mellitus with foot ulcer Facility Procedures : CPT4 Code: [id number] Description: 99213 - WOUND CARE VISIT-LEV 3 EST PT Modifier: 25 Quantity: 1 Physician Procedures : CPT4 Code Description Modifier [id number] - WC PHYS LEVEL 3 - EST PT ICD-10 Diagnosis Description S91.302A Unspecified open wound, left foot, initial encounter E11.621 Type 2 diabetes mellitus with foot ulcer Quantity: 1 Electronic Signature(s) Signed: 07/04/2022 3:41:53 PM By: Kalman Shan DO Entered By: Kalman Shan on 07/04/2022 15:19:08

## 2022-07-06 NOTE — Progress Notes (Deleted)
Cardiology Office Note:    Date:  07/06/2022   ID:  Corey, [name] 1946-03-13, MRN [id number]  PCP:  Fay Records, MD  [facility] Cardiologist:  Candee Furbish, MD Electrophysiologist:  Melida Quitter, MD { Click to update primary MD,subspecialty MD or APP then REFRESH:1}  *** Referring MD: Fay Records, MD   Chief Complaint:  No chief complaint on file. {Click here for Visit Info    :1}    History of Present Illness:   Corey [name] is a 77 y.o. 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     In 2022, Corey [name] presented to [facility] with [place]. LHC at that time demonstrated 80% occlusion of the LAD, however, PCI was deferred due to markedly elevated LVEDP. LVEF by echo at that time of 35%-40%. Due to a large pericardial effusion, he had a window done with biopsies at that time compatible with amyloidosis. AL evaluation was negative. PYP was equivocal. 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 He's followed in AHF and by EPS.      Past Medical History:  Diagnosis Date   Cataract    CHF (congestive heart failure) (HCC)    Chronic heart failure with preserved ejection fraction (HFpEF) (HCC)    Colon polyp 2003   Dr [name]; F/U was to be 2008( not completed)   Coronary artery disease    CVA (cerebral infarction) 2014   Diabetes mellitus    Diverticulosis 2003   Dyspnea    with exertion   Gout    Hereditary cardiac amyloidosis (HCC)    Hypertension    Myocardial infarction ([place]) 09/19/2012   PEA cardiac arrest in setting of acute respiratory failure/pulmonary edema   OSA (obstructive sleep apnea) 03/27/2018   Pneumonia 09/29/2011    Avelox X 10 days as OP   Prostate cancer ([place])    Renal insufficiency    Seizures ([place]) 09/19/2012   not treated for seizure disorder; had a seizure after stroke 2014; no seizure since then   Stroke [facility])    Current Medications: No outpatient medications have been marked as taking for the 07/10/22 encounter (Appointment) with Imogene Burn, PA-C.    Allergies:   Hydrochlorothiazide   Social History   Tobacco Use   Smoking status: Former    Packs/day: 0.25    Years: 1.00    Total pack years: 0.25    Types: Cigarettes    Quit date: 05/15/1968    Years since quitting: 54.1   Smokeless tobacco: Never   Tobacco comments:    smoked [place], up to 1-2 cigarettes/ day  Vaping Use   Vaping Use: Never used  Substance Use Topics   Alcohol use: No    Alcohol/week: 0.0 standard drinks of alcohol   Drug use: No    Family Hx: The patient's family history includes Cancer in his father; Diabetes in his father and mother; Heart failure in his brother and mother; Hypertension in his father and mother; Prostate cancer in his father. There is no history of Stroke, Heart disease, or Colon cancer.  ROS     Physical Exam:    VS:  There were no vitals taken for this visit.  Wt Readings from Last 3 Encounters:  06/23/22 170 lb (77.1 kg)  05/23/22 187 lb (84.8 kg)  05/23/22 186 lb 9.6 oz (84.6 kg)    Physical Exam  GEN: Well nourished, well developed, in no acute distress  HEENT: normal  Neck: no JVD, carotid bruits, or masses Cardiac:RRR; no murmurs, rubs, or gallops  Respiratory:  clear to auscultation bilaterally, normal work of breathing GI: soft, nontender, nondistended, + BS Ext: without cyanosis, clubbing, or edema, Good distal pulses bilaterally MS: no deformity or atrophy  Skin: warm and dry, no rash Neuro:  Alert and Oriented x 3, Strength and sensation are intact Psych: euthymic mood, full affect        EKGs/Labs/Other Test Reviewed:    EKG:  EKG is ***  ordered today.  The ekg ordered today demonstrates ***  Recent Labs: 01/05/2022: ALT 31; Pro B Natriuretic peptide (BNP) 1,740.0 03/22/2022: B Natriuretic Peptide 1,571.3 06/14/2022: Hemoglobin 11.7; Platelets 129 07/03/2022: BUN 97; Creatinine, Ser 3.52; Potassium 3.3; Sodium 141   Recent Lipid Panel Recent Labs    03/23/22 1615  CHOL 86  TRIG 54  HDL 44  VLDL 11  LDLCALC 31     Prior CV Studies: {Select studies to display:26339}   ECHO: 13' echo: normal EF, severe LVH,G2DD, suggestive for cardiac amyloidosis  13' cMRI: less suggestive of amyloid and more consistent with non obstructive HOCM  14' echo: 55-60%, severe LVH, mod pericardial effusion 19' echo: EF 55-60%, G3DD, moderate pericardial effusion  5/22: ECHO with EF 35-40%, G2DD, RWMA, Large, pericardial effusion  5/22: Underwent pericardial window procedure for his pericardial effusion and biopsies were taken which were consistent with amyloidosis, suggestive of possible AL amyloidosis  8/22: PYP: Findings are equivocal for TTR amyloidosis      CATH: 5/22: L/RHC: single vessel CAD 80% LAD occlusion with severely elevated LVEDP      Risk Assessment/Calculations/Metrics:   {Does this patient have ATRIAL FIBRILLATION?:[id number]}     No BP recorded.  {Refresh Note OR Click here to enter BP  :1}***    ASSESSMENT & PLAN:   No problem-specific Assessment & Plan notes found for this encounter.   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 stress test, cath, DCCV, TEE, etc)?   Press F2        :[id number]   Dispo:  No follow-ups on file.   Medication Adjustments/Labs and Tests Ordered: Current medicines are reviewed at length with the patient today.  Concerns regarding medicines are outlined above.  Tests Ordered: No orders of the defined types were placed in this  encounter.  Medication Changes: No orders of the defined types were placed in this encounter.  Sumner Boast, PA-C  07/06/2022 9:17 AM    [facility] [place], [place], [place]  [address] Phone: [phone]; Fax: [phone]

## 2022-07-07 ENCOUNTER — Other Ambulatory Visit: Payer: Self-pay | Admitting: *Deleted

## 2022-07-07 ENCOUNTER — Telehealth: Payer: Self-pay | Admitting: *Deleted

## 2022-07-07 DIAGNOSIS — Z79899 Other long term (current) drug therapy: Secondary | ICD-10-CM

## 2022-07-07 DIAGNOSIS — I5042 Chronic combined systolic (congestive) and diastolic (congestive) heart failure: Secondary | ICD-10-CM

## 2022-07-07 MED ORDER — TORSEMIDE 40 MG PO TABS: 40.0000 mg | ORAL_TABLET | Freq: Every day | ORAL | 3 refills | Status: DC

## 2022-07-07 NOTE — Telephone Encounter (Signed)
Spoke with wife who is reporting she is trying to obtain transportation through [place].  She received a letter from [employer] to [phone] stating why he can not take public transportation.  Wife is mainly trying to get this set up before he has to start dialysis.  She can drive pt but he has so many appts, it becomes a burden.  Advised I will have Dr Marlou Porch to review.

## 2022-07-07 NOTE — Progress Notes (Signed)
SAMIT, SISTI ([id number]) [id number] Page 1 of 9 Visit Report for 07/04/2022 Arrival Information Details Patient Name: Date of Service: Corey [name]. 07/04/2022 2:00 PM Medical Record Number: [id number] Patient Account Number: [id number] Date of Birth/Sex: Treating RN: [birth year] (77 y.o. M) Primary Care Corey [name]: Corey Climes, [place] Other Clinician: Referring Corey [name]: Treating Corey [name]/Extender: Corey [name] in Treatment: 20 Visit Information History Since Last Visit Added or deleted any medications: No Patient Arrived: Ambulatory Any new allergies or adverse reactions: No Arrival Time: 14:07 Had a fall or experienced change in No Accompanied By: wife activities of daily living that may affect Transfer Assistance: None risk of falls: Patient Identification Verified: Yes Signs or symptoms of abuse/neglect since last visito No Secondary Verification Process Completed: Yes Hospitalized since last visit: No Patient Requires Transmission-Based No Implantable device outside of the clinic excluding No Precautions: cellular tissue based products placed in the center Patient Has Alerts: Yes since last visit: Patient Alerts: in clinic R [place] Has Compression in Place as Prescribed: Yes 11/17/2021 ABIs: both Apex Pain Present Now: No 11/17/2021 TBI: R0.64 L0.58 Electronic Signature(s) Signed: 07/04/2022 4:38:15 PM By: Corey [name] Entered By: Corey [name] on 07/04/2022 14:11:25 -------------------------------------------------------------------------------- Clinic Level of Care Assessment Details Patient Name: Date of Service: Corey [name]. 07/04/2022 2:00 PM Medical Record Number: [id number] Patient Account Number: [id number] Date of Birth/Sex: Treating RN: 23-May-1945 (77 y.o. Corey [name] Primary Care Corey [name]: Corey [name], [place] Other Clinician: Referring Corey [name]: Treating Corey [name]/Extender: Corey [name] in  Treatment: 35 Clinic Level of Care Assessment Items TOOL 4 Quantity Score X- 1 0 Use when only an EandM is performed on FOLLOW-UP visit ASSESSMENTS - Nursing Assessment / Reassessment X- 1 10 Reassessment of Co-morbidities (includes updates in patient status) X- 1 5 Reassessment of Adherence to Treatment Plan ASSESSMENTS - Wound and Skin A ssessment / Reassessment X - Simple Wound Assessment / Reassessment - one wound 1 5 '[]'$  - 0 Complex Wound Assessment / Reassessment - multiple wounds '[]'$  - 0 Dermatologic / Skin Assessment (not related to wound area) ASSESSMENTS - Focused Assessment X- 1 5 Circumferential Edema Measurements - multi extremities '[]'$  - 0 Nutritional Assessment / Counseling / Intervention Corey [name], Corey [name] ([id number]) [id number] Page 2 of 9 '[]'$  - 0 Lower Extremity Assessment (monofilament, tuning fork, pulses) '[]'$  - 0 Peripheral Arterial Disease Assessment (using hand held doppler) ASSESSMENTS - Ostomy and/or Continence Assessment and Care '[]'$  - 0 Incontinence Assessment and Management '[]'$  - 0 Ostomy Care Assessment and Management (repouching, etc.) PROCESS - Coordination of Care X - Simple Patient / Family Education for ongoing care 1 15 '[]'$  - 0 Complex (extensive) Patient / Family Education for ongoing care X- 1 10 Staff obtains Programmer, systems, Records, T Results / Process Orders est '[]'$  - 0 Staff telephones HHA, Nursing Homes / Clarify orders / etc '[]'$  - 0 Routine Transfer to another Facility (non-emergent condition) '[]'$  - 0 Routine Hospital Admission (non-emergent condition) '[]'$  - 0 New Admissions / Biomedical engineer / Ordering NPWT Apligraf, etc. , '[]'$  - 0 Emergency Hospital Admission (emergent condition) '[]'$  - 0 Simple Discharge Coordination '[]'$  - 0 Complex (extensive) Discharge Coordination PROCESS - Special Needs '[]'$  - 0 Pediatric / Minor Patient Management '[]'$  - 0 Isolation Patient Management '[]'$  - 0 Hearing / Language / Visual  special needs '[]'$  - 0 Assessment of Community assistance (transportation, D/C planning, etc.) '[]'$  - 0 Additional assistance / Altered mentation '[]'$  - 0  Support Surface(s) Assessment (bed, cushion, seat, etc.) INTERVENTIONS - Wound Cleansing / Measurement X - Simple Wound Cleansing - one wound 1 5 '[]'$  - 0 Complex Wound Cleansing - multiple wounds X- 1 5 Wound Imaging (photographs - any number of wounds) '[]'$  - 0 Wound Tracing (instead of photographs) X- 1 5 Simple Wound Measurement - one wound '[]'$  - 0 Complex Wound Measurement - multiple wounds INTERVENTIONS - Wound Dressings X - Small Wound Dressing one or multiple wounds 1 10 '[]'$  - 0 Medium Wound Dressing one or multiple wounds '[]'$  - 0 Large Wound Dressing one or multiple wounds '[]'$  - 0 Application of Medications - topical '[]'$  - 0 Application of Medications - injection INTERVENTIONS - Miscellaneous '[]'$  - 0 External ear exam '[]'$  - 0 Specimen Collection (cultures, biopsies, blood, body fluids, etc.) '[]'$  - 0 Specimen(s) / Culture(s) sent or taken to Lab for analysis '[]'$  - 0 Patient Transfer (multiple staff / Civil Service fast streamer / Similar devices) '[]'$  - 0 Simple Staple / Suture removal (25 or less) '[]'$  - 0 Complex Staple / Suture removal (26 or more) '[]'$  - 0 Hypo / Hyperglycemic Management (close monitor of Blood Glucose) Corey [name], Corey [name] ([id number]) [id number] Page 3 of 9 '[]'$  - 0 Ankle / Brachial Index (ABI) - do not check if billed separately X- 1 5 Vital Signs Has the patient been seen at the hospital within the last three years: Yes Total Score: 80 Level Of Care: New/Established - Level 3 Electronic Signature(s) Signed: 07/06/2022 1:42:30 PM By: Corey Creamer RN, BSN Entered By: Corey [name] on 07/04/2022 15:00:57 -------------------------------------------------------------------------------- Encounter Discharge Information Details Patient Name: Date of Service: Corey [name], [name] 07/04/2022 2:00 PM Medical  Record Number: [id number] Patient Account Number: [id number] Date of Birth/Sex: Treating RN: 1945-07-19 (77 y.o. Corey [name] Primary Care Fahd Galea: Corey [name], [place] U[place] Other Clinician: Referring Estefano Victory: Treating Turki Tapanes/Extender: Corey [name] in Treatment: 13 Encounter Discharge Information Items Discharge Condition: Stable Ambulatory Status: Ambulatory Discharge Destination: Home Transportation: Private Auto Accompanied By: wife Schedule Follow-up Appointment: Yes Clinical Summary of Care: Patient Declined Electronic Signature(s) Signed: 07/06/2022 1:42:30 PM By: Corey Creamer RN, BSN Entered By: Corey [name] on 07/04/2022 15:01:38 -------------------------------------------------------------------------------- Lower Extremity Assessment Details Patient Name: Date of Service: Corey [name], [name] 07/04/2022 2:00 PM Medical Record Number: [id number] Patient Account Number: [id number] Date of Birth/Sex: Treating RN: 03-30-1946 (77 y.o. M) Primary Care Jary Louvier: Corey Climes, [place] Other Clinician: Referring Dorlisa Savino: Treating Loria Lacina/Extender: [name] in Treatment: 35 Edema Assessment Assessed: [Left: No] [Right: No] [Left: Edema] [Right: :] Calf Left: Right: Point of Measurement: From Medial Instep 32.5 cm Ankle Left: Right: Point of Measurement: From Medial Instep 21 cm Electronic Signature(s) Signed: 07/04/2022 4:38:15 PM By: Corey [name] Federer,Signed: 07/04/2022 4:38:15 PM By: Benson Norway ([id number]) [id number] Page 4 of 9 Entered By: Corey [name] on 07/04/2022 14:25:03 -------------------------------------------------------------------------------- Multi Wound Chart Details Patient Name: Date of Service: Corey [name]. 07/04/2022 2:00 PM Medical Record Number: [id number] Patient Account Number: [id number] Date of Birth/Sex: Treating RN: [birth year] (77 y.o. M) Primary Care  Aarica Wax: Corey Climes, [place] U[place] Other Clinician: Referring Midori Dado: Treating Kelby Adell/Extender: Corey [name] in Treatment: 35 Vital Signs Height(in): 66 Pulse(bpm): 44 Weight(lbs): 145 Blood Pressure(mmHg): 107/70 Body Mass Index(BMI): 23.4 Temperature(F): 97.5 Respiratory Rate(breaths/min): 18 [3:Photos:] [N/A:N/A] Left Calcaneus N/A N/A Wound Location: Skin T ear/Laceration N/A N/A Wounding Event: Diabetic Wound/Ulcer of the Lower N/A N/A Primary  Etiology: Extremity Cataracts, Sleep Apnea, Congestive N/A N/A Comorbid History: Heart Failure, Hypertension, Myocardial Infarction, Type II Diabetes, Gout 06/26/2022 N/A N/A Date Acquired: 1 N/A N/A Weeks of Treatment: Open N/A N/A Wound Status: No N/A N/A Wound Recurrence: 0.1x0.1x0.1 N/A N/A Measurements L x W x D (cm) 0.008 N/A N/A A (cm) : rea 0.001 N/A N/A Volume (cm) : 98.20% N/A N/A % Reduction in A rea: 97.70% N/A N/A % Reduction in Volume: Grade 2 N/A N/A Classification: Medium N/A N/A Exudate A mount: Serosanguineous N/A N/A Exudate Type: red, brown N/A N/A Exudate Color: Large (67-100%) N/A N/A Granulation A mount: Red N/A N/A Granulation Quality: None Present (0%) N/A N/A Necrotic A mount: Fat Layer (Subcutaneous Tissue): Yes N/A N/A Exposed Structures: Fascia: No Tendon: No Muscle: No Joint: No Bone: No None N/A N/A Epithelialization: Callus: Yes N/A N/A Periwound Skin Texture: Dry/Scaly: Yes N/A N/A Periwound Skin Moisture: Treatment Notes Wound #3 (Calcaneus) Wound Laterality: Left Cleanser Wound Cleanser Discharge Instruction: Cleanse the wound with wound cleanser prior to applying a clean dressing using gauze sponges, not tissue or cotton balls. Corey [name], Corey [name] ([id number]) [id number] Page 5 of 9 Peri-Wound Care Topical Primary Dressing Hydrofera Blue Ready Transfer Foam, 2.5x2.5 (in/in) Discharge Instruction: Apply directly to  wound bed as directed MediHoney Gel, tube 1.5 (oz) Discharge Instruction: Apply to wound bed as instructed Secondary Dressing ALLEVYN Heel 4 1/2in x 5 1/2in / 10.5cm x 13.5cm Discharge Instruction: Apply over primary dressing as directed. Woven Gauze Sponge, Non-Sterile 4x4 in Discharge Instruction: Apply over primary dressing as directed. Secured With [employer], 4.5x3.1 (in/yd) Discharge Instruction: Secure with Kerlix as directed. 24M Medipore H Soft Cloth Surgical T ape, 4 x 10 (in/yd) Discharge Instruction: Secure with tape as directed. Compression Wrap Compression Stockings Add-Ons Electronic Signature(s) Signed: 07/04/2022 3:41:53 PM By: Kalman Shan DO Entered By: Kalman Shan on 07/04/2022 15:16:42 -------------------------------------------------------------------------------- Multi-Disciplinary Care Plan Details Patient Name: Date of Service: Corey [name], [name] 07/04/2022 2:00 PM Medical Record Number: [id number] Patient Account Number: [id number] Date of Birth/Sex: Treating RN: Mar 25, 1946 (77 y.o. Corey [name] Primary Care Greenly Rarick: Corey [name], [place] U[place] Other Clinician: Referring Naythen Heikkila: Treating Hannah Strader/Extender: Corey [name] in Treatment: 75 Active Inactive Necrotic Tissue Nursing Diagnoses: Knowledge deficit related to management of necrotic/devitalized tissue Goals: Necrotic/devitalized tissue will be minimized in the wound bed Date Initiated: 11/01/2021 Target Resolution Date: 07/14/2022 Goal Status: Active Patient/caregiver will verbalize understanding of reason and process for debridement of necrotic tissue Date Initiated: 11/01/2021 Date Inactivated: 12/05/2021 Target Resolution Date: 12/09/2021 Goal Status: Met Interventions: Assess patient pain level pre-, during and post procedure and prior to discharge Provide education on necrotic tissue and debridement process Treatment Activities: Apply topical anesthetic as  ordered : 11/01/2021 Enzymatic debridement : 11/01/2021 [name] ([id number]) [id number] Page 6 of 9 Excisional debridement : 11/01/2021 Notes: Electronic Signature(s) Signed: 07/06/2022 1:42:30 PM By: Corey Creamer RN, BSN Entered By: Corey [name] on 07/04/2022 14:59:40 -------------------------------------------------------------------------------- Pain Assessment Details Patient Name: Date of Service: Corey [name], [name] 07/04/2022 2:00 PM Medical Record Number: [id number] Patient Account Number: [id number] Date of Birth/Sex: Treating RN: [birth year] (77 y.o. M) Primary Care Amiree No: Corey Climes, [place] U[place] Other Clinician: Referring Revonda Menter: Treating Demarious Kapur/Extender: Corey [name] in Treatment: 85 Active Problems Location of Pain Severity and Description of Pain Patient Has Paino No Site Locations Pain Management and Medication Current Pain Management: Electronic Signature(s) Signed: 07/04/2022 4:38:15 PM By: Corey [name] Entered  By: Corey [name] on 07/04/2022 14:14:12 -------------------------------------------------------------------------------- Patient/Caregiver Education Details Patient Name: Date of Service: Corey [name] 2/20/2024andnbsp2:00 PM Medical Record Number: [id number] Patient Account Number: [id number] Date of Birth/Gender: Treating RN: 1945/10/12 (77 y.o. Corey [name] Primary Care Physician: Corey [name], [place] U[place] Other Clinician: Referring Physician: Treating Physician/Extender: Corey [name] in Treatment: [address] Corey [name], Corey [name] [place] ([id number]) [id number] Page 7 of 9 Education Provided To: Patient Education Topics Provided Wound/Skin Impairment: Methods: Explain/Verbal Responses: State content correctly Electronic Signature(s) Signed: 07/06/2022 1:42:30 PM By: Corey Creamer RN, BSN Entered By: Corey [name] on 07/04/2022  15:00:00 -------------------------------------------------------------------------------- Wound Assessment Details Patient Name: Date of Service: Corey [name], [name] 07/04/2022 2:00 PM Medical Record Number: [id number] Patient Account Number: [id number] Date of Birth/Sex: Treating RN: 10-26-1945 (77 y.o. M) Primary Care Modest Draeger: Corey Climes, PA U[place] Other Clinician: Referring Fidencia Mccloud: Treating Shatika Grinnell/Extender: Corey [name] in Treatment: 35 Wound Status Wound Number: 3 Primary Diabetic Wound/Ulcer of the Lower Extremity Etiology: Wound Location: Left Calcaneus Wound Open Wounding Event: Skin Tear/Laceration Status: Date Acquired: 06/26/2022 Comorbid Cataracts, Sleep Apnea, Congestive Heart Failure, Hypertension, Weeks Of Treatment: 1 History: Myocardial Infarction, Type II Diabetes, Gout Clustered Wound: No Photos Wound Measurements Length: (cm) 0.1 Width: (cm) 0.1 Depth: (cm) 0.1 Area: (cm) 0.008 Volume: (cm) 0.001 % Reduction in Area: 98.2% % Reduction in Volume: 97.7% Epithelialization: None Wound Description Classification: Grade 2 Exudate Amount: Medium Exudate Type: Serosanguineous Exudate Color: red, brown Foul Odor After Cleansing: No Slough/Fibrino Yes Wound Bed Granulation Amount: Large (67-100%) Exposed Structure Granulation Quality: Red Fascia Exposed: No Necrotic Amount: None Present (0%) Fat Layer (Subcutaneous Tissue) Exposed: Yes Tendon Exposed: No Muscle Exposed: No Corey [name], Corey [name] ([id number]) [id number] Page 8 of 9 Joint Exposed: No Bone Exposed: No Periwound Skin Texture Texture Color No Abnormalities Noted: No No Abnormalities Noted: No Callus: Yes Moisture No Abnormalities Noted: No Dry / Scaly: Yes Treatment Notes Wound #3 (Calcaneus) Wound Laterality: Left Cleanser Wound Cleanser Discharge Instruction: Cleanse the wound with wound cleanser prior to applying a clean dressing using gauze  sponges, not tissue or cotton balls. Peri-Wound Care Topical Primary Dressing Hydrofera Blue Ready Transfer Foam, 2.5x2.5 (in/in) Discharge Instruction: Apply directly to wound bed as directed MediHoney Gel, tube 1.5 (oz) Discharge Instruction: Apply to wound bed as instructed Secondary Dressing ALLEVYN Heel 4 1/2in x 5 1/2in / 10.5cm x 13.5cm Discharge Instruction: Apply over primary dressing as directed. Woven Gauze Sponge, Non-Sterile 4x4 in Discharge Instruction: Apply over primary dressing as directed. Secured With [employer], 4.5x3.1 (in/yd) Discharge Instruction: Secure with Kerlix as directed. 35M Medipore H Soft Cloth Surgical T ape, 4 x 10 (in/yd) Discharge Instruction: Secure with tape as directed. Compression Wrap Compression Stockings Add-Ons Electronic Signature(s) Signed: 07/04/2022 4:38:15 PM By: Corey [name] Entered By: Corey [name] on 07/04/2022 14:24:04 -------------------------------------------------------------------------------- Vitals Details Patient Name: Date of Service: Corey [name], [name] 07/04/2022 2:00 PM Medical Record Number: [id number] Patient Account Number: [id number] Date of Birth/Sex: Treating RN: Jan 07, 1946 (77 y.o. M) Primary Care Aqua Denslow: Corey Climes, [place] Other Clinician: Referring Augusta Mirkin: Treating Miki Labuda/Extender: Corey [name] in Treatment: 35 Vital Signs Time Taken: 14:11 Temperature (F): 97.5 Height (in): 66 Pulse (bpm): 44 Weight (lbs): 145 Respiratory Rate (breaths/min): 18 Body Mass Index (BMI): 23.4 Blood Pressure (mmHg): 107/70 Corey [name], Corey [name] ([id number]) [id number] Page 9 of 9 Reference Range: 80 - 120 mg / dl Electronic Signature(s) Signed: 07/04/2022 4:38:15 PM  By: Corey [name] Entered By: Corey [name] on 07/04/2022 14:14:06

## 2022-07-07 NOTE — Progress Notes (Signed)
Thanks for the update Let's cut the Torsemide to '40mg'$  once a day then. Continue to watch the weight. BMET in 4 weeks Candee Furbish, MD         Order placed for BMP.  Wife aware of order to decrease Torsemide to 40 mg once a day and to have repeat lab in 1 month.  Lab scheduled 08/04/22.

## 2022-07-07 NOTE — Telephone Encounter (Signed)
07/07/22  5:37 PM Note Spoke with wife who is reporting she is trying to obtain transportation through [place].  She received a letter from [employer] to [phone] stating why he can not take public transportation.  Wife is mainly trying to get this set up before he has to start dialysis.  She can drive pt but he has so many appts, it becomes a burden.  Advised I will have Dr Marlou Porch to review.

## 2022-07-10 ENCOUNTER — Ambulatory Visit: Payer: Medicare HMO | Admitting: Physician Assistant

## 2022-07-11 ENCOUNTER — Encounter (HOSPITAL_BASED_OUTPATIENT_CLINIC_OR_DEPARTMENT_OTHER): Payer: Medicare HMO | Admitting: Internal Medicine

## 2022-07-11 ENCOUNTER — Other Ambulatory Visit (HOSPITAL_COMMUNITY): Payer: Self-pay

## 2022-07-11 DIAGNOSIS — S91302A Unspecified open wound, left foot, initial encounter: Secondary | ICD-10-CM

## 2022-07-11 DIAGNOSIS — L97918 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 with other specified severity: Secondary | ICD-10-CM | POA: Diagnosis not present

## 2022-07-11 DIAGNOSIS — I87313 Chronic venous hypertension (idiopathic) with ulcer of bilateral lower extremity: Secondary | ICD-10-CM | POA: Diagnosis not present

## 2022-07-11 DIAGNOSIS — S81802A Unspecified open wound, left lower leg, initial encounter: Secondary | ICD-10-CM | POA: Diagnosis not present

## 2022-07-11 DIAGNOSIS — N184 Chronic kidney disease, stage 4 (severe): Secondary | ICD-10-CM | POA: Diagnosis not present

## 2022-07-11 DIAGNOSIS — E11621 Type 2 diabetes mellitus with foot ulcer: Secondary | ICD-10-CM

## 2022-07-11 DIAGNOSIS — I130 Hypertensive heart and chronic kidney disease with heart failure and stage 1 through stage 4 chronic kidney disease, or unspecified chronic kidney disease: Secondary | ICD-10-CM | POA: Diagnosis not present

## 2022-07-11 DIAGNOSIS — E11622 Type 2 diabetes mellitus with other skin ulcer: Secondary | ICD-10-CM | POA: Diagnosis not present

## 2022-07-11 DIAGNOSIS — E1122 Type 2 diabetes mellitus with diabetic chronic kidney disease: Secondary | ICD-10-CM | POA: Diagnosis not present

## 2022-07-12 NOTE — Progress Notes (Signed)
Corey [name], ESCUE ([id number]) [id number] Page 1 of 7 Visit Report for 07/11/2022 Arrival Information Details Patient Name: Date of Service: Corey [name] 07/11/2022 12:45 PM Medical Record Number: [id number] Patient Account Number: [id number] Date of Birth/Sex: Treating RN: 10/12/1945 (77 y.o. Corey [name] Primary Care Corey [name]: Corey Climes, PA Corey [name] Other Clinician: Referring Corey [name]: Treating Corey [name]/Extender: Corey Canada, PA Corey [name] Weeks in Treatment: 70 Visit Information History Since Last Visit Added or deleted any medications: No Patient Arrived: Ambulatory Any new allergies or adverse reactions: No Arrival Time: 12:49 Had a fall or experienced change in No Accompanied By: wife activities of daily living that may affect Transfer Assistance: None risk of falls: Patient Identification Verified: Yes Signs or symptoms of abuse/neglect since last visito No Secondary Verification Process Completed: Yes Hospitalized since last visit: No Patient Requires Transmission-Based No Implantable device outside of the clinic excluding No Precautions: cellular tissue based products placed in the center Patient Has Alerts: Yes since last visit: Patient Alerts: in clinic [name] Has Dressing in Place as Prescribed: Yes 11/17/2021 ABIs: both  Pain Present Now: No 11/17/2021 TBI: R0.64 L0.58 Electronic Signature(s) Signed: 07/11/2022 4:02:57 PM By: Sharyn Creamer RN, BSN Entered By: Sharyn Creamer on 07/11/2022 12:51:09 -------------------------------------------------------------------------------- Clinic Level of Care Assessment Details Patient Name: Date of Service: Corey [name], [place]. 07/11/2022 12:45 PM Medical Record Number: [id number] Patient Account Number: [id number] Date of Birth/Sex: Treating RN: 1945/05/26 (77 y.o. Corey [name] Primary Care Corey [name]: Corey Climes, PA Corey [name] Other Clinician: Referring Corey [name]: Treating Corey [name]/Extender: Corey Canada, PA Corey [name] Weeks in Treatment: 2 Clinic Level of Care Assessment Items TOOL 4 Quantity Score X- 1 0 Use when only an EandM is performed on FOLLOW-UP visit ASSESSMENTS - Nursing Assessment / Reassessment X- 1 10 Reassessment of Co-morbidities (includes updates in patient status) X- 1 5 Reassessment of Adherence to Treatment Plan ASSESSMENTS - Wound and Skin A ssessment / Reassessment X - Simple Wound Assessment / Reassessment - one wound 1 5 '[]'$  - 0 Complex Wound Assessment / Reassessment - multiple wounds '[]'$  - 0 Dermatologic / Skin Assessment (not related to wound area) ASSESSMENTS - Focused Assessment X- 1 5 Circumferential Edema Measurements - multi extremities '[]'$  - 0 Nutritional Assessment / Counseling / Intervention '[]'$  - 0 Lower Extremity Assessment (monofilament, tuning fork, pulses) '[]'$  - 0 Peripheral Arterial Disease Assessment (using hand held doppler) ASSESSMENTS - Ostomy and/or Continence Assessment and Care '[]'$  - 0 Incontinence Assessment and Management '[]'$  - 0 Ostomy Care Assessment and Management (repouching, etc.) PROCESS - Coordination of Care '[]'$  - 0 Simple Patient / Family Education for ongoing care Corey [name], Corey [name] ([id number]) [id number] Page 2 of 7 '[]'$  - 0 Complex (extensive) Patient / Family Education for ongoing care X- 1 10 Staff obtains Programmer, systems, Records, T Results / Process Orders est '[]'$  - 0 Staff telephones HHA, Nursing Homes / Clarify orders / etc '[]'$  - 0 Routine Transfer to another Facility (non-emergent condition) '[]'$  - 0 Routine Hospital Admission (non-emergent condition) '[]'$  - 0 New Admissions / Biomedical engineer / Ordering NPWT Apligraf, etc. , '[]'$  - 0 Emergency Hospital Admission (emergent condition) X- 1 10 Simple Discharge Coordination '[]'$  - 0 Complex (extensive) Discharge Coordination PROCESS - Special Needs '[]'$  - 0 Pediatric / Minor Patient Management '[]'$  - 0 Isolation Patient Management '[]'$  - 0 Hearing  / Language / Visual special needs '[]'$  - 0 Assessment of Community assistance (transportation, D/C planning, etc.) '[]'$  - 0 Additional  assistance / Altered mentation '[]'$  - 0 Support Surface(s) Assessment (bed, cushion, seat, etc.) INTERVENTIONS - Wound Cleansing / Measurement X - Simple Wound Cleansing - one wound 1 5 '[]'$  - 0 Complex Wound Cleansing - multiple wounds X- 1 5 Wound Imaging (photographs - any number of wounds) '[]'$  - 0 Wound Tracing (instead of photographs) X- 1 5 Simple Wound Measurement - one wound '[]'$  - 0 Complex Wound Measurement - multiple wounds INTERVENTIONS - Wound Dressings '[]'$  - 0 Small Wound Dressing one or multiple wounds '[]'$  - 0 Medium Wound Dressing one or multiple wounds '[]'$  - 0 Large Wound Dressing one or multiple wounds '[]'$  - 0 Application of Medications - topical '[]'$  - 0 Application of Medications - injection INTERVENTIONS - Miscellaneous '[]'$  - 0 External ear exam '[]'$  - 0 Specimen Collection (cultures, biopsies, blood, body fluids, etc.) '[]'$  - 0 Specimen(s) / Culture(s) sent or taken to Lab for analysis '[]'$  - 0 Patient Transfer (multiple staff / Civil Service fast streamer / Similar devices) '[]'$  - 0 Simple Staple / Suture removal (25 or less) '[]'$  - 0 Complex Staple / Suture removal (26 or more) '[]'$  - 0 Hypo / Hyperglycemic Management (close monitor of Blood Glucose) '[]'$  - 0 Ankle / Brachial Index (ABI) - do not check if billed separately X- 1 5 Vital Signs Has the patient been seen at the hospital within the last three years: Yes Total Score: 65 Level Of Care: New/Established - Level 2 Electronic Signature(s) Signed: 07/11/2022 4:02:57 PM By: Sharyn Creamer RN, BSN Entered By: Sharyn Creamer on 07/11/2022 13:08:08 Corey [name] ([id number]) [id number] Page 3 of 7 -------------------------------------------------------------------------------- Encounter Discharge Information Details Patient Name: Date of Service: Corey [name] 07/11/2022  12:45 PM Medical Record Number: [id number] Patient Account Number: [id number] Date of Birth/Sex: Treating RN: July 18, 1945 (77 y.o. Corey [name] Primary Care Kaion Tisdale: Corey Climes, PA Corey [name] Other Clinician: Referring Reigan Tolliver: Treating Ilithyia Titzer/Extender: Corey Canada, PA Corey [name] Weeks in Treatment: 49 Encounter Discharge Information Items Discharge Condition: Stable Ambulatory Status: Ambulatory Discharge Destination: Home Transportation: Private Auto Accompanied By: self Schedule Follow-up Appointment: Yes Clinical Summary of Care: Patient Declined Electronic Signature(s) Signed: 07/11/2022 4:02:57 PM By: Sharyn Creamer RN, BSN Entered By: Sharyn Creamer on 07/11/2022 13:08:58 -------------------------------------------------------------------------------- Lower Extremity Assessment Details Patient Name: Date of Service: Corey [name]. 07/11/2022 12:45 PM Medical Record Number: [id number] Patient Account Number: [id number] Date of Birth/Sex: Treating RN: 1945-06-27 (77 y.o. Corey [name] Primary Care Timathy Newberry: Corey Climes, PA Corey [name] Other Clinician: Referring Nephtali Docken: Treating Yehia Mcbain/Extender: Corey Canada, PA Corey [name] Weeks in Treatment: 36 Edema Assessment Assessed: [Left: No] [Right: No] [Left: Edema] [Right: :] Calf Left: Right: Point of Measurement: From Medial Instep 32.5 cm Ankle Left: Right: Point of Measurement: From Medial Instep 21 cm Electronic Signature(s) Signed: 07/11/2022 4:02:57 PM By: Sharyn Creamer RN, BSN Entered By: Sharyn Creamer on 07/11/2022 12:51:58 -------------------------------------------------------------------------------- Multi Wound Chart Details Patient Name: Date of Service: Corey [name], [name] 07/11/2022 12:45 PM Medical Record Number: [id number] Patient Account Number: [id number] Date of Birth/Sex: Treating RN: 11-30-1945 (77 y.o. M) Primary Care Earla Charlie: Corey Climes, PA Corey [name] Other Clinician: Referring Breanna Mcdaniel: Treating  Shalia Bartko/Extender: Corey Canada, PA Corey [name] Weeks in Treatment: 22 Vital Signs Height(in): 66 Pulse(bpm): 48 Weight(lbs): 145 Blood Pressure(mmHg): 122/75 Body Mass Index(BMI): 23.4 Temperature(F): 98.7 LEVAR, KNIERIM ([id number]) [id number] Page 4 of 7 Respiratory Rate(breaths/min): 18 [3:Photos:] [N/A:N/A] Left Calcaneus N/A N/A Wound Location: Skin T ear/Laceration N/A N/A  Wounding Event: Diabetic Wound/Ulcer of the Lower N/A N/A Primary Etiology: Extremity Cataracts, Sleep Apnea, Congestive N/A N/A Comorbid History: Heart Failure, Hypertension, Myocardial Infarction, Type II Diabetes, Gout 06/26/2022 N/A N/A Date Acquired: 2 N/A N/A Weeks of Treatment: Open N/A N/A Wound Status: No N/A N/A Wound Recurrence: 0x0x0 N/A N/A Measurements L x W x D (cm) 0 N/A N/A A (cm) : rea 0 N/A N/A Volume (cm) : 100.00% N/A N/A % Reduction in A rea: 100.00% N/A N/A % Reduction in Volume: Grade 2 N/A N/A Classification: Medium N/A N/A Exudate A mount: Serosanguineous N/A N/A Exudate Type: red, brown N/A N/A Exudate Color: Large (67-100%) N/A N/A Granulation A mount: Red N/A N/A Granulation Quality: None Present (0%) N/A N/A Necrotic A mount: Fat Layer (Subcutaneous Tissue): Yes N/A N/A Exposed Structures: Fascia: No Tendon: No Muscle: No Joint: No Bone: No None N/A N/A Epithelialization: Callus: Yes N/A N/A Periwound Skin Texture: Dry/Scaly: Yes N/A N/A Periwound Skin Moisture: Treatment Notes Wound #3 (Calcaneus) Wound Laterality: Left Cleanser Peri-Wound Care Topical Primary Dressing Secondary Dressing Secured With Compression Wrap Compression Stockings Add-Ons Electronic Signature(s) Signed: 07/11/2022 3:46:53 PM By: Kalman Shan DO Entered By: Kalman Shan on 07/11/2022 13:15:05 -------------------------------------------------------------------------------- Multi-Disciplinary Care Plan  Details Patient Name: Date of Service: Corey [name], [name] 07/11/2022 12:45 PM Corey [name] ([id number]) [id number] Page 5 of 7 Medical Record Number: [id number] Patient Account Number: [id number] Date of Birth/Sex: Treating RN: 08-27-1945 (77 y.o. Corey [name] Primary Care Lyrah Bradt: Corey [name], [place] Corey [name] Other Clinician: Referring Isabela Nardelli: Treating Iysis Germain/Extender: Corey Canada, PA Corey [name] Weeks in Treatment: 56 Active Inactive Electronic Signature(s) Signed: 07/11/2022 4:02:57 PM By: Sharyn Creamer RN, BSN Entered By: Sharyn Creamer on 07/11/2022 12:56:54 -------------------------------------------------------------------------------- Pain Assessment Details Patient Name: Date of Service: Corey [name], [name] 07/11/2022 12:45 PM Medical Record Number: [id number] Patient Account Number: [id number] Date of Birth/Sex: Treating RN: 25-Feb-1946 (77 y.o. Corey [name] Primary Care Renn Dirocco: Corey Climes, PA Corey [name] Other Clinician: Referring Yaritza Leist: Treating Suzanne Kho/Extender: Corey Canada, PA Corey [name] Weeks in Treatment: 43 Active Problems Location of Pain Severity and Description of Pain Patient Has Paino No Site Locations Pain Management and Medication Current Pain Management: Electronic Signature(s) Signed: 07/11/2022 4:02:57 PM By: Sharyn Creamer RN, BSN Entered By: Sharyn Creamer on 07/11/2022 12:51:50 -------------------------------------------------------------------------------- Patient/Caregiver Education Details Patient Name: Date of Service: Corey [name] 2/27/2024andnbsp12:45 PM Medical Record Number: [id number] Patient Account Number: [id number] Date of Birth/Gender: Treating RN: 08/04/1945 (77 y.o. Corey [name] Primary Care Physician: Corey [name], [place] Corey [name] Other Clinician: Referring Physician: Treating Physician/Extender: Corey Canada, PA Lawana Chambers in Treatment: 52 Education Assessment Education Provided To: Corey [name], Corey [name]  ([id number]) [id number] Page 6 of 7 Patient Education Topics Provided Wound/Skin Impairment: Methods: Explain/Verbal Responses: State content correctly Electronic Signature(s) Signed: 07/11/2022 4:02:57 PM By: Sharyn Creamer RN, BSN Entered By: Sharyn Creamer on 07/11/2022 12:57:41 -------------------------------------------------------------------------------- Wound Assessment Details Patient Name: Date of Service: Corey [name], [name] 07/11/2022 12:45 PM Medical Record Number: [id number] Patient Account Number: [id number] Date of Birth/Sex: Treating RN: 14-Sep-1945 (77 y.o. Corey [name] Primary Care Ashaad Gaertner: Corey Climes, PA Corey [name] Other Clinician: Referring Khyrin Trevathan: Treating Zachry Hopfensperger/Extender: Corey Canada, PA Corey [name] Weeks in Treatment: 36 Wound Status Wound Number: 3 Primary Diabetic Wound/Ulcer of the Lower Extremity Etiology: Wound Location: Left Calcaneus Wound Open Wounding Event: Skin Tear/Laceration Status: Date Acquired: 06/26/2022 Comorbid Cataracts, Sleep Apnea, Congestive Heart Failure, Hypertension, Weeks Of  Treatment: 2 History: Myocardial Infarction, Type II Diabetes, Gout Clustered Wound: No Photos Wound Measurements Length: (cm) Width: (cm) Depth: (cm) Area: (cm) Volume: (cm) 0 % Reduction in Area: 100% 0 % Reduction in Volume: 100% 0 Epithelialization: None 0 0 Wound Description Classification: Grade 2 Exudate Amount: Medium Exudate Type: Serosanguineous Exudate Color: red, brown Foul Odor After Cleansing: No Slough/Fibrino Yes Wound Bed Granulation Amount: Large (67-100%) Exposed Structure Granulation Quality: Red Fascia Exposed: No Necrotic Amount: None Present (0%) Fat Layer (Subcutaneous Tissue) Exposed: Yes Tendon Exposed: No Muscle Exposed: No Joint Exposed: No Bone Exposed: No Periwound Skin Texture Texture Color No Abnormalities Noted: No No Abnormalities Noted: No Callus: [address] Corey [name], Corey [name]  ([id number]) [id number] Page 7 of 7 Moisture No Abnormalities Noted: No Dry / Scaly: Yes Electronic Signature(s) Signed: 07/11/2022 4:02:57 PM By: Sharyn Creamer RN, BSN Entered By: Sharyn Creamer on 07/11/2022 12:49:41 -------------------------------------------------------------------------------- [place] Details Patient Name: Date of Service: Corey [name], [name] 07/11/2022 12:45 PM Medical Record Number: [id number] Patient Account Number: [id number] Date of Birth/Sex: Treating RN: 1945-11-09 (77 y.o. Corey [name] Primary Care Larkin Morelos: Corey Climes, PA Corey [name] Other Clinician: Referring Jalia Zuniga: Treating Lilya Smitherman/Extender: Corey Canada, PA Corey [name] Weeks in Treatment: 40 Vital Signs Time Taken: 12:47 Temperature (F): 98.7 Height (in): 66 Pulse (bpm): 48 Weight (lbs): 145 Respiratory Rate (breaths/min): 18 Body Mass Index (BMI): 23.4 Blood Pressure (mmHg): 122/75 Reference Range: 80 - 120 mg / dl Electronic Signature(s) Signed: 07/11/2022 4:02:57 PM By: Sharyn Creamer RN, BSN Entered By: Sharyn Creamer on 07/11/2022 12:51:43

## 2022-07-12 NOTE — Progress Notes (Signed)
Corey, [name] ([id number]) [id number] Page 1 of 8 Visit Report for 07/11/2022 Chief Complaint Document Details Patient Name: Date of Service: Corey [name] 07/11/2022 12:45 PM Medical Record Number: [id number] Patient Account Number: [id number] Date of Birth/Sex: Treating RN: Jun 15, 1945 (77 y.o. [name]) Primary Care Provider: Piedad Climes, PA ULA Other Clinician: Referring Provider: Treating Provider/Extender: Fuller Canada, PA ULA Weeks in Treatment: 53 Information Obtained from: Patient Chief Complaint 11/01/2021; right lower extremity wound Electronic Signature(s) Signed: 07/11/2022 3:46:53 PM By: Kalman Shan DO Entered By: Kalman Shan on 07/11/2022 13:15:13 -------------------------------------------------------------------------------- HPI Details Patient Name: Date of Service: Corey [name], Corey [name]. 07/11/2022 12:45 PM Medical Record Number: [id number] Patient Account Number: [id number] Date of Birth/Sex: Treating RN: 01/04/1946 (77 y.o. [name]) Primary Care Provider: Piedad Climes, PA ULA Other Clinician: Referring Provider: Treating Provider/Extender: Fuller Canada, PA ULA Weeks in Treatment: 45 History of Present Illness HPI Description: Admission 11/01/2021 [name] Is a 77 year old male with a past medical history of of type 2 diabetes, coronary artery disease, stage IV kidney disease acute on chronic combined systolic and diastolic heart failure that presents to the clinic for a 10-monthhistory of right lower extremity wound. He states he is not sure how this started. It is completely eschared. He reports his wound has been stable for the past month. He has been keeping the area covered. He has mild chronic pain. He denies signs of infection. He states he has a history of lower extremity ulcers last being on the left leg. He could not tell me how this was treated in the past. 6/27; patient presents for follow-up. He is scheduled for his  lower extremity arterial studies on July 6th. He has been using Medihoney to the wound bed. He has no issues or complaints today. He denies signs of infection. 7/6; patient presents for follow-up. He has home health that reported the wound having odor and increased pain last week. He was sent to urgent care. He was started on doxycycline. He is continuing Medihoney. He has his scheduled ABIs w/TBIs today. 7/10; patient presents for follow-up. He had his arterial studies done last week. He had triphasic waveforms throughout the right lower extremity. His TBI was 0.64. He has good pedal pulses. He is almost done with his course of antibiotics. He has been using mupirocin ointment to the wound bed. 7/17; patient presents for follow-up. He has been using Iodosorb under compression therapy. He has no issues or complaints today. He states he is receiving Keystone antibiotic tomorrow In the mail. 7/24; patient presents for follow-up. He has been doing Dakin's wet-to-dry dressings to the wound bed daily without issues. He currently denies signs of infection. He reports completing his course of doxycycline. He has his Keystone antibiotic with him today. 7/31; patient presents for follow-up. We have been [facility]. He has no issues or complaints today. He denies signs of infection. 8/7; patient presents for follow-up. We have [facility] Home health came out once last week to change the wrap. He has no issues or complaints today. He denies signs of infection. 8/14; patient presents for follow-up. We have been using Keystone with Hydrofera Blue under compression therapy. He has tolerated this well. He has no issues or complaints today. 8/21; patient presents for follow-up. We have been using Keystone with Hydrofera Blue under compression therapy. The Hydrofera Blue was stuck on tightly to the  wound bed today. Other  than that he has no issues or complaints. We have not heard back for insurance approval for skin sub. 8/28; patient presents for follow-up. We ordered blast X and collagen powder at last clinic visit. It is unclear if he has received this. We placed Keystone with Fibracol under 3 layer compression last week. Patient has home health. Patient has no issues or complaints today. He was not approved for TheraSkin. 9/5; patient presents for follow-up. Insurance would not cover blast X. We do have a sample and he would like to try this. We have been using Keystone antibiotic and collagen under 3 layer compression. He has no issues or complaints today. 9/18; patient presents for follow-up. We have been using collagen with blast X and Keystone under compression therapy. He has no issues or complaints today. Corey [name], Corey [name] ([id number]) [id number] Page 2 of 8 9/25; patient presents for follow-up. We have continued to use collagen, blast X and Keystone under compression therapy. Patient has no issues or complaints today. 10/6; patient presents for follow-up. We have been using collagen, blast X and Keystone under compression therapy. He denies signs of infection and has no issues or complaints today. Patient has home health that comes out on Wednesdays and Fridays. 10/16; patient presents for follow-up. He has been using collagen, blast X and Keystone with benefit to the wound bed under compression therapy. He is about to run out of [facility] antibiotic ointment. He has no issues or complaints today. 10/23; patient presents for follow-up. We continue to use collagen, blast X and Keystone under compression therapy. Continued report in wound healing. Home health continues to come out to do dressing changes. Patient has no issues or complaints today. 10/30; patient presents for follow-up. We have been using collagen, blast X and Keystone antibiotic under compression therapy. His wound  continues to show improvement in healing. He has no issues or complaints today. 11/30; patient was last seen 1 month ago. He has missed his last clinic visit. He has home health who changes the dressings 3 times weekly. He has been using collagen with Keystone antibiotic under 3 layer compression. He has run out of [place] antibiotic ointment. The wound is almost healed. 12/18; patient was last seen 3 weeks ago. We have been using collagen with Keystone antibiotic under 3 layer compression. Home health has been coming out twice a week. He has no issues or complaints today. 1/4; patient presents for follow-up. We have been using collagen under 3 layer compression to the right lower extremity. Home health has been coming out twice weekly to change the dressing. He states the wrap slid down this past week. 1/18; patient presents for follow-up. We have been using Hydrofera Blue under 3 layer compression to the right lower extremity. Home health is discharging the patient. He has no issues or complaints today. 1/26; patient presents for follow-up. We have been using Hydrofera Blue under 3 layer compression to the right lower extremity. He states his diuretic was increased. He has no issues or complaints today. 2/5; patient presents for follow-up. We have been using endoform under 3 layer compression to the right lower extremity. Unfortunately has developed a new wound to the left lower extremity. This started spontaneously over the past week. He was recently started on a new diuretic. He denies signs of infection. 2/12; patient presents for follow-up. We have been using endoform under 3 layer compression to the lower extremities bilaterally. His wounds of healed. Unfortunately has developed a  new wound to the left heel. He is not quite sure how this happened. He denies signs of infection. 2/20; patient presents for follow-up. He has been using Medihoney and Hydrofera Blue to the left heel. This area is  almost closed. He has no issues or complaints today. 2/27; patient presents for follow-up. He has been using Medihoney and Hydrofera Blue to the left heel. This has closed up. He has no issues or complaints today. Electronic Signature(s) Signed: 07/11/2022 3:46:53 PM By: Kalman Shan DO Entered By: Kalman Shan on 07/11/2022 13:15:39 -------------------------------------------------------------------------------- Physical Exam Details Patient Name: Date of Service: Corey [name], Corey [name]. 07/11/2022 12:45 PM Medical Record Number: [id number] Patient Account Number: [id number] Date of Birth/Sex: Treating RN: 1945-06-12 (77 y.o. [name]) Primary Care Provider: Piedad Climes, PA ULA Other Clinician: Referring Provider: Treating Provider/Extender: Fuller Canada, PA ULA Weeks in Treatment: 62 Constitutional respirations regular, non-labored and within target range for patient.. Cardiovascular 2+ dorsalis pedis/posterior tibialis pulses. Psychiatric pleasant and cooperative. Notes T the left heel there is Epithelization to the previous wound site. No signs of infection. o Electronic Signature(s) Signed: 07/11/2022 3:46:53 PM By: Kalman Shan DO Entered By: Kalman Shan on 07/11/2022 13:19:26 -------------------------------------------------------------------------------- Physician Orders Details Patient Name: Date of Service: Corey [name], Corey [name]. 07/11/2022 12:45 PM Corey [name] ([id number]) [id number] Page 3 of 8 Medical Record Number: [id number] Patient Account Number: [id number] Date of Birth/Sex: Treating RN: [birth year] (77 y.o. Corey [name] Primary Care Provider: Piedad Climes, PA ULA Other Clinician: Referring Provider: Treating Provider/Extender: Fuller Canada, PA ULA Weeks in Treatment: 38 Verbal / Phone Orders: No Diagnosis Coding Discharge From [facility] Services Discharge from [place]!!!!!!! Edema Control -  Lymphedema / SCD / Other Avoid standing for long periods of time. Patient to wear own compression stockings every day. - We will apply a tubi grip size E double folded today Moisturize legs daily. Electronic Signature(s) Signed: 07/11/2022 3:46:53 PM By: Kalman Shan DO Entered By: Kalman Shan on 07/11/2022 13:19:34 -------------------------------------------------------------------------------- Problem List Details Patient Name: Date of Service: Corey [name], Corey [name]. 07/11/2022 12:45 PM Medical Record Number: [id number] Patient Account Number: [id number] Date of Birth/Sex: Treating RN: 12/30/1945 (77 y.o. [name]) Primary Care Provider: Piedad Climes, PA ULA Other Clinician: Referring Provider: Treating Provider/Extender: Fuller Canada, PA ULA Weeks in Treatment: 49 Active Problems ICD-10 Encounter Code Description Active Date MDM Diagnosis L97.918 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 with other 11/01/2021 No Yes specified severity E11.622 Type 2 diabetes mellitus with other skin ulcer 11/01/2021 No Yes I50.42 Chronic combined systolic (congestive) and diastolic (congestive) heart failure 11/01/2021 No Yes I25.119 Atherosclerotic heart disease of native coronary artery with unspecified angina 11/01/2021 No Yes pectoris I87.311 Chronic venous hypertension (idiopathic) with ulcer of right lower extremity 12/26/2021 No Yes I87.312 Chronic venous hypertension (idiopathic) with ulcer of left lower extremity 06/19/2022 No Yes S81.802A Unspecified open wound, left lower leg, initial encounter 06/19/2022 No Yes S91.302A Unspecified open wound, left foot, initial encounter 06/26/2022 No Yes E11.621 Type 2 diabetes mellitus with foot ulcer 06/26/2022 No Yes Corey [name], Corey [name] ([id number]) [id number] Page 4 of 8 Inactive Problems Resolved Problems Electronic Signature(s) Signed: 07/11/2022 3:46:53 PM By: Kalman Shan DO Entered By: Kalman Shan on 07/11/2022 13:14:59 -------------------------------------------------------------------------------- Progress Note Details Patient Name: Date of Service: Corey [name], Corey [name]. 07/11/2022 12:45 PM Medical Record Number: [id number] Patient Account Number: [id number] Date of Birth/Sex: Treating RN:  1945-09-09 (77 y.o. [name]) Primary Care Provider: Piedad Climes, PA ULA Other Clinician: Referring Provider: Treating Provider/Extender: Fuller Canada, PA ULA Weeks in Treatment: 24 Subjective Chief Complaint Information obtained from Patient 11/01/2021; right lower extremity wound History of Present Illness (HPI) Admission 11/01/2021 Mr. Melakai Corella Is a 77 year old male with a past medical history of of type 2 diabetes, coronary artery disease, stage IV kidney disease acute on chronic combined systolic and diastolic heart failure that presents to the clinic for a 75-monthhistory of right lower extremity wound. He states he is not sure how this started. It is completely eschared. He reports his wound has been stable for the past month. He has been keeping the area covered. He has mild chronic pain. He denies signs of infection. He states he has a history of lower extremity ulcers last being on the left leg. He could not tell me how this was treated in the past. 6/27; patient presents for follow-up. He is scheduled for his lower extremity arterial studies on July 6th. He has been using Medihoney to the wound bed. He has no issues or complaints today. He denies signs of infection. 7/6; patient presents for follow-up. He has home health that reported the wound having odor and increased pain last week. He was sent to urgent care. He was started on doxycycline. He is continuing Medihoney. He has his scheduled ABIs w/TBIs today. 7/10; patient presents for follow-up. He had his arterial studies done last week. He had triphasic waveforms throughout the right lower extremity. His TBI was 0.64. He has good  pedal pulses. He is almost done with his course of antibiotics. He has been using mupirocin ointment to the wound bed. 7/17; patient presents for follow-up. He has been using Iodosorb under compression therapy. He has no issues or complaints today. He states he is receiving Keystone antibiotic tomorrow In the mail. 7/24; patient presents for follow-up. He has been doing Dakin's wet-to-dry dressings to the wound bed daily without issues. He currently denies signs of infection. He reports completing his course of doxycycline. He has his Keystone antibiotic with him today. 7/31; patient presents for follow-up. We have been using Iodoflex [facility]. He has no issues or complaints today. He denies signs of infection. 8/7; patient presents for follow-up. We have been [facility]. Home health came out once last week to change the wrap. He has no issues or complaints today. He denies signs of infection. 8/14; patient presents for follow-up. We have been using Keystone with Hydrofera Blue under compression therapy. He has tolerated this well. He has no issues or complaints today. 8/21; patient presents for follow-up. We have been using Keystone with Hydrofera Blue under compression therapy. The Hydrofera Blue was stuck on tightly to the wound bed today. Other than that he has no issues or complaints. We have not heard back for insurance approval for skin sub. 8/28; patient presents for follow-up. We ordered blast X and collagen powder at last clinic visit. It is unclear if he has received this. We placed Keystone with Fibracol under 3 layer compression last week. Patient has home health. Patient has no issues or complaints today. He was not approved for TheraSkin. 9/5; patient presents for follow-up. Insurance would not cover blast X. We do have a sample and he would like to try this. We have been using Keystone antibiotic and collagen  under 3 layer compression. He has no issues or complaints today.  9/18; patient presents for follow-up. We have been using collagen with blast X and Keystone under compression therapy. He has no issues or complaints today. 9/25; patient presents for follow-up. We have continued to use collagen, blast X and Keystone under compression therapy. Patient has no issues or complaints today. 10/6; patient presents for follow-up. We have been using collagen, blast X and Keystone under compression therapy. He denies signs of infection and has no issues or complaints today. Patient has home health that comes out on Wednesdays and Fridays. 10/16; patient presents for follow-up. He has been using collagen, blast X and Keystone with benefit to the wound bed under compression therapy. He is about to run out of [facility] antibiotic ointment. He has no issues or complaints today. 10/23; patient presents for follow-up. We continue to use collagen, blast X and Keystone under compression therapy. Continued report in wound healing. Home health continues to come out to do dressing changes. Patient has no issues or complaints today. Corey [name], Corey [name] ([id number]) [id number] Page 5 of 8 10/30; patient presents for follow-up. We have been using collagen, blast X and Keystone antibiotic under compression therapy. His wound continues to show improvement in healing. He has no issues or complaints today. 11/30; patient was last seen 1 month ago. He has missed his last clinic visit. He has home health who changes the dressings 3 times weekly. He has been using collagen with Keystone antibiotic under 3 layer compression. He has run out of [place] antibiotic ointment. The wound is almost healed. 12/18; patient was last seen 3 weeks ago. We have been using collagen with Keystone antibiotic under 3 layer compression. Home health has been coming out twice a week. He has no issues or complaints today. 1/4;  patient presents for follow-up. We have been using collagen under 3 layer compression to the right lower extremity. Home health has been coming out twice weekly to change the dressing. He states the wrap slid down this past week. 1/18; patient presents for follow-up. We have been using Hydrofera Blue under 3 layer compression to the right lower extremity. Home health is discharging the patient. He has no issues or complaints today. 1/26; patient presents for follow-up. We have been using Hydrofera Blue under 3 layer compression to the right lower extremity. He states his diuretic was increased. He has no issues or complaints today. 2/5; patient presents for follow-up. We have been using endoform under 3 layer compression to the right lower extremity. Unfortunately has developed a new wound to the left lower extremity. This started spontaneously over the past week. He was recently started on a new diuretic. He denies signs of infection. 2/12; patient presents for follow-up. We have been using endoform under 3 layer compression to the lower extremities bilaterally. His wounds of healed. Unfortunately has developed a new wound to the left heel. He is not quite sure how this happened. He denies signs of infection. 2/20; patient presents for follow-up. He has been using Medihoney and Hydrofera Blue to the left heel. This area is almost closed. He has no issues or complaints today. 2/27; patient presents for follow-up. He has been using Medihoney and Hydrofera Blue to the left heel. This has closed up. He has no issues or complaints today. Patient History Family History Cancer - Father, Diabetes - Mother,Father, Heart Disease - Mother,Siblings, Hypertension - Mother,Father. Social History Former smoker - quit 1970, Marital Status - Married, Drug Use - No History, Caffeine Use - Moderate - coffee. Medical History Eyes  Patient has history of Cataracts Respiratory Patient has history of Sleep Apnea -  has CPAP does not wear Cardiovascular Patient has history of Congestive Heart Failure, Hypertension, Myocardial Infarction - 2014 Endocrine Patient has history of Type II Diabetes Musculoskeletal Patient has history of Gout Neurologic Denies history of Seizure Disorder Hospitalization/Surgery History - 06/2021 inguinal hernia repair. - cardioversion 09/2020. - ventral hernia repair 3/19. 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 Vitals Time Taken: 12:47 PM, Height: 66 in, Weight: 145 lbs, BMI: 23.4, Temperature: 98.7 F, Pulse: 48 bpm, Respiratory Rate: 18 breaths/min, Blood Pressure: 122/75 mmHg. Cardiovascular 2+ dorsalis pedis/posterior tibialis pulses. Psychiatric pleasant and cooperative. General Notes: T the left heel there is Epithelization to the previous wound site. No signs of infection. Corey [name], Corey [name] ([id number]) [id number] Page 6 of 8 Integumentary (Hair, Skin) Wound #3 status is Open. Original cause of wound was Skin T ear/Laceration. The date acquired was: 06/26/2022. The wound has been in treatment 2 weeks. The wound is located on the Left Calcaneus. The wound measures 0cm length x 0cm width x 0cm depth; 0cm^2 area and 0cm^3 volume. There is Fat Layer (Subcutaneous Tissue) exposed. There is a medium amount of serosanguineous drainage noted. There is large (67-100%) red granulation within the wound bed. There is no necrotic tissue within the wound bed. The periwound skin appearance exhibited: Callus, Dry/Scaly. 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 Type 2 diabetes mellitus with other skin  ulcer Chronic combined systolic (congestive) and diastolic (congestive) heart failure Atherosclerotic heart disease of native coronary artery with unspecified angina pectoris Chronic venous hypertension (idiopathic) with ulcer of right lower extremity Chronic venous hypertension (idiopathic) with ulcer of left lower extremity Unspecified open wound, left lower leg, initial encounter Unspecified open wound, left foot, initial encounter Type 2 diabetes mellitus with foot ulcer Patient has done well with Medihoney and Hydrofera Blue. His left heel wound is closed. I recommended keeping an eye on this daily to assure that it does not reopen. Continue aggressive offloading over the next week. Follow-up as needed. Plan Discharge From [facility] Services: Discharge from [place]!!!!!!! Edema Control - Lymphedema / SCD / Other: Avoid standing for long periods of time. Patient to wear own compression stockings every day. - We will apply a tubi grip size E double folded today Moisturize legs daily. 1. Discharge from clinic due to closed wound 2. Compression stockings daily 3. Follow-up as needed Electronic Signature(s) Signed: 07/11/2022 3:46:53 PM By: Kalman Shan DO Entered By: Kalman Shan on 07/11/2022 13:22:35 -------------------------------------------------------------------------------- HxROS Details Patient Name: Date of Service: Corey [name], Corey [name]. 07/11/2022 12:45 PM Medical Record Number: [id number] Patient Account Number: [id number] Date of Birth/Sex: Treating RN: 1946-02-29 (77 y.o. [name]) Primary Care Provider: Piedad Climes, PA ULA Other Clinician: Referring Provider: Treating Provider/Extender: Fuller [place], PA ULA Weeks in Treatment: 52 Constitutional Symptoms (General Health) Medical History: Past Medical History Notes: CVA 2014 bilateral hearing loss Eyes Medical History: Positive for: Cataracts Respiratory Medical History: Positive for:  Sleep Apnea - has CPAP does not wear Corey [name], Corey [name] ([id number]) [id number] Page 7 of 8 Cardiovascular Medical History: Positive for: Congestive Heart Failure; Hypertension; Myocardial Infarction - 2014 Gastrointestinal Medical History: Past Medical History Notes: Diverticulosis Endocrine Medical History: Positive  for: Type II Diabetes Time with diabetes: >20 years Treated with: Oral agents, Diet Blood sugar tested every day: Yes Tested : BID Genitourinary Medical History: Past Medical History Notes: Renal insufficiency 20/ Musculoskeletal Medical History: Positive for: Gout Neurologic Medical History: Negative for: Seizure Disorder Oncologic Medical History: Past Medical History Notes: Prostate Ca HBO Extended History Items Eyes: Cataracts Immunizations Pneumococcal Vaccine: Received Pneumococcal Vaccination: No Implantable Devices No devices added Hospitalization / Surgery History Type of Hospitalization/Surgery 06/2021 inguinal hernia repair cardioversion 09/2020 ventral hernia repair 3/19 Family and Social History Cancer: Yes - Father; Diabetes: Yes - Mother,Father; Heart Disease: Yes - Mother,Siblings; Hypertension: Yes - Mother,Father; Former smoker - quit 1970; Marital Status - Married; Drug Use: No History; Caffeine Use: Moderate - coffee; Financial Concerns: No; Food, Clothing or Shelter Needs: No; Support System Lacking: No; Transportation Concerns: No Electronic Signature(s) Signed: 07/11/2022 3:46:53 PM By: Kalman Shan DO Entered By: Kalman Shan on 07/11/2022 13:15:45 SuperBill Details -------------------------------------------------------------------------------- Corey [name] ([id number]) [id number] Page 8 of 8 Patient Name: Date of Service: Corey [name] 07/11/2022 Medical Record Number: [id number] Patient Account Number: [id number] Date of Birth/Sex: Treating RN: November 03, 1945 (77  y.o. Corey [name] Primary Care Provider: Piedad Climes, PA [facility] Other Clinician: Referring Provider: Treating Provider/Extender: Fuller Canada, PA ULA Weeks in Treatment: 36 Diagnosis Coding ICD-10 Codes Code Description [id number] 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 with other specified severity E11.622 Type 2 diabetes mellitus with other skin ulcer I50.42 Chronic combined systolic (congestive) and diastolic (congestive) heart failure I25.119 Atherosclerotic heart disease of native coronary artery with unspecified angina pectoris I87.311 Chronic venous hypertension (idiopathic) with ulcer of right lower extremity I87.312 Chronic venous hypertension (idiopathic) with ulcer of left lower extremity S81.802A Unspecified open wound, left lower leg, initial encounter S91.302A Unspecified open wound, left foot, initial encounter E11.621 Type 2 diabetes mellitus with foot ulcer Facility Procedures CPT4 Code Description Modifier Quantity [id number] - WOUND CARE VISIT-LEV 2 EST PT 25 1 Physician Procedures Quantity CPT4 Code Description Modifier [id number] - WC PHYS LEVEL 3 - EST PT 1 ICD-10 Diagnosis Description S91.302A Unspecified open wound, left foot, initial encounter E11.621 Type 2 diabetes mellitus with foot ulcer Electronic Signature(s) Signed: 07/11/2022 3:46:53 PM By: Kalman Shan DO Entered By: Kalman Shan on 07/11/2022 13:22:59

## 2022-07-14 ENCOUNTER — Other Ambulatory Visit: Payer: Medicare HMO

## 2022-07-14 ENCOUNTER — Other Ambulatory Visit (HOSPITAL_COMMUNITY): Payer: Self-pay

## 2022-07-14 DIAGNOSIS — Z515 Encounter for palliative care: Secondary | ICD-10-CM

## 2022-07-14 NOTE — Progress Notes (Signed)
PATIENT NAME: ANDREAUS TEZAK DOB: April 27, 1946 MRN: [id number]  PRIMARY CARE PROVIDER: Fay Records, MD  RESPONSIBLE PARTY:  Acct ID - Guarantor Home Phone Work Phone Relationship Acct Type  [id number] LAMERE, AHLERT [id number]  Self P/F     [address], [place], [place] [address]   I connected with  [name] on 07/14/22 by telephone and verified that I am speaking with the correct person using two identifiers.   I discussed the limitations of evaluation and management by telemedicine. The patient expressed understanding and agreed to proceed.    Palliative Care Follow Up Encounter Note    Completed telephone visit. Wife also present.     HISTORY OF PRESENT ILLNESS:    Since last visit: Torsemide increased to twice a day. Patient reports less swelling to fistula site.   Appetite: eats 1 reduced meal at breakfast and 1 full meal at dinner and snacks throughout the day; drinks fluids throughout the day   Mobility: walks without assist but doesn't walk as much as he used to  GI/GU: has a BM every day; denies urinary issues  Wound Care: released from the wound center this week  Respiratory: get SOB when ambulating; reports he will sit down and rest if he feels the need   Sleeping Pattern: nods off during the day and doesn't sleep well at night    Pain: denies pain at this time; Tylenol when he has back pain      CODE STATUS: Full Code ADVANCED DIRECTIVES: N MOST FORM: No    Next Appt Scheduled For: 08/15/22 @ 11am in the home    PHYSICAL EXAM:   VITALS:There were no vitals filed for this visit.         Keeanna Villafranca Georgann Housekeeper, LPN

## 2022-07-21 ENCOUNTER — Telehealth: Payer: Self-pay

## 2022-07-21 DIAGNOSIS — N184 Chronic kidney disease, stage 4 (severe): Secondary | ICD-10-CM | POA: Diagnosis not present

## 2022-07-21 DIAGNOSIS — I5042 Chronic combined systolic (congestive) and diastolic (congestive) heart failure: Secondary | ICD-10-CM | POA: Diagnosis not present

## 2022-07-21 DIAGNOSIS — E1122 Type 2 diabetes mellitus with diabetic chronic kidney disease: Secondary | ICD-10-CM | POA: Diagnosis not present

## 2022-07-21 DIAGNOSIS — N281 Cyst of kidney, acquired: Secondary | ICD-10-CM | POA: Diagnosis not present

## 2022-07-21 DIAGNOSIS — N185 Chronic kidney disease, stage 5: Secondary | ICD-10-CM

## 2022-07-21 DIAGNOSIS — E8581 Light chain (AL) amyloidosis: Secondary | ICD-10-CM | POA: Diagnosis not present

## 2022-07-21 DIAGNOSIS — R809 Proteinuria, unspecified: Secondary | ICD-10-CM | POA: Diagnosis not present

## 2022-07-21 DIAGNOSIS — N2581 Secondary hyperparathyroidism of renal origin: Secondary | ICD-10-CM | POA: Diagnosis not present

## 2022-07-21 DIAGNOSIS — I130 Hypertensive heart and chronic kidney disease with heart failure and stage 1 through stage 4 chronic kidney disease, or unspecified chronic kidney disease: Secondary | ICD-10-CM | POA: Diagnosis not present

## 2022-07-21 DIAGNOSIS — N179 Acute kidney failure, unspecified: Secondary | ICD-10-CM | POA: Diagnosis not present

## 2022-07-21 DIAGNOSIS — I3139 Other pericardial effusion (noninflammatory): Secondary | ICD-10-CM | POA: Diagnosis not present

## 2022-07-21 NOTE — Telephone Encounter (Signed)
Pt's wife, [name], called stating that the pt saw Dr. Royce Macadamia and she wanted his arm to be checked.  Reviewed pt's chart, returned call for clarification, two identifiers used. Pt's wife states that Dr. Royce Macadamia, the pt's nephrologist, is expecting to have pt start HD treatment soon. She wants to ensure that the AVF is viable since he's had it since 01/2022. Pt is not experiencing any issues and Dr. Royce Macadamia is not concerned. She just wants the pt to be prepared. Appts scheduled for [place] and [place] for earliest possible on Dr. Donzetta Matters office day. Pt aware of wait time in between [place] & [place] visits. Confirmed understanding.

## 2022-07-24 ENCOUNTER — Ambulatory Visit: Payer: Medicare HMO | Attending: Cardiovascular Disease | Admitting: Cardiovascular Disease

## 2022-07-24 ENCOUNTER — Encounter: Payer: Self-pay | Admitting: Cardiovascular Disease

## 2022-07-24 VITALS — BP 110/66 | HR 55 | Ht 66.0 in | Wt 158.0 lb

## 2022-07-24 DIAGNOSIS — I480 Paroxysmal atrial fibrillation: Secondary | ICD-10-CM

## 2022-07-24 DIAGNOSIS — I4892 Unspecified atrial flutter: Secondary | ICD-10-CM | POA: Insufficient documentation

## 2022-07-24 LAB — LAB REPORT - SCANNED: EGFR: 21

## 2022-07-24 NOTE — Progress Notes (Signed)
Electrophysiology Office Note:    Date:  08/15/2022   ID:  Corey [name], DOB 1946-04-18, MRN [id number]  PCP:  Fay Records, MD   [facility] Providers Cardiologist:  Candee Furbish, MD Electrophysiologist:  Melida Quitter, MD     Referring MD: [name], MD   History of Present Illness:    Corey [name] is a 77 y.o. male with a hx detailed below, significant for ATTR cardiac amyloid, pericardial effusion status post window, CHFrEF, CAD ESRD approaching dialysis, and atrial fibrillation and flutter referred for arrhythmia management.  In 2022, Corey [name] presented to [facility] with [place]. LHC at that time demonstrated 80% occlusion of the LAD, however, PCI was deferred due to markedly elevated LVEDP. LVEF by echo at that time of 35%-40%. Due to a large pericardial effusion, Corey [name] had a window done with biopsies at that time compatible with amyloidosis. AL evaluation was negative. PYP was equivocal. Genetic testing consistent with Val142Ile TTR.   Corey [name] was admitted in early November 2023 with volume overload and an atrial fibrillation occasionally organizing into flutter. Corey [name] was diuresed, underwent TEE/DCCV x 2. When in sinus rhythm, Corey [name] was often bradycardic with sinus brady and Mobitz I AV block. I saw him a few times during this admission and recommended placement of a holter monitor on discharge to assess chronotropic competence.  Corey [name] underwent EP study and ablation for atrial flutter in March, 2024. Corey [name] had multiple arrhythmias -- multiple flutters involving variable circuits as well as a transient focal tachycardia. I performed a CTI ablation as at least one of the flutters involved the cavo-tricuspid isthmus. I elected not to pursue all the multiple flutter circuits. Today Corey [name] has an atypical atrial flutter. Corey [name] continues to have shortness of breath and fatigue but no palpitations.  Past Medical History:  Diagnosis Date   Cataract    CHF (congestive heart failure)     Chronic heart failure with preserved ejection fraction (HFpEF)    Colon polyp 2003   Dr [name]; F/U was to be 2008( not completed)   Coronary artery disease    CVA (cerebral infarction) 2014   Diabetes mellitus    Diverticulosis 2003   Dyspnea    with exertion   Gout    Hereditary cardiac amyloidosis    Hypertension    Myocardial infarction 09/19/2012   PEA cardiac arrest in setting of acute respiratory failure/pulmonary edema   OSA (obstructive sleep apnea) 03/27/2018   Pneumonia 09/29/2011   Avelox X 10 days as OP   Prostate cancer    Renal insufficiency    Seizures 09/19/2012   not treated for seizure disorder; had a seizure after stroke 2014; no seizure since then   Stroke     Past Surgical History:  Procedure Laterality Date   A-FLUTTER ABLATION N/A 06/23/2022   Procedure: A-FLUTTER ABLATION;  Surgeon: Melida Quitter, MD;  Location: [place] [facility];  Service: Cardiovascular;  Laterality: N/A;   A/V FISTULAGRAM Left 04/28/2022   Procedure: A/V Fistulagram;  Surgeon: Angelia Mould, MD;  Location: [place] [facility];  Service: Cardiovascular;  Laterality: Left;   AV FISTULA PLACEMENT Left 01/31/2022   Procedure: LEFT ARM ARTERIOVENOUS (AV) FISTULA CREATION;  Surgeon: Waynetta Sandy, MD;  Location: [facility];  Service: Vascular;  Laterality: Left;  regional block to left arm   CARDIOVERSION N/A 03/24/2022   Procedure: CARDIOVERSION;  Surgeon: Hebert Soho, DO;  Location: [place];  Service: Cardiovascular;  Laterality:  N/A;   COLONOSCOPY W/ POLYPECTOMY  2003   no F/U (SOC discussed 12/03/12)   INGUINAL HERNIA REPAIR Left 06/16/2021   Procedure: LAPAROSCOPIC, POSSIBLY OPEN LEFT INGUINAL HERNIA REPAIR WITH MESH;  Surgeon: Felicie Morn, MD;  Location: [facility];  Service: General;  Laterality: Left;   INSERTION OF MESH N/A 07/16/2017   Procedure: INSERTION OF MESH;  Surgeon: [name], MD;  Location: [place] [facility];  Service: General;   Laterality: N/A;   PEG PLACEMENT  2014   10/08/12-12/10/12   PEG TUBE REMOVAL  2014   PERIPHERAL VASCULAR BALLOON ANGIOPLASTY  04/28/2022   Procedure: PERIPHERAL VASCULAR BALLOON ANGIOPLASTY;  Surgeon: Angelia Mould, MD;  Location: [place] [facility];  Service: Cardiovascular;;   PROSTATE BIOPSY     RIGHT HEART CATH N/A 03/24/2022   Procedure: RIGHT HEART CATH;  Surgeon: Hebert Soho, DO;  Location: [place] [facility];  Service: Cardiovascular;  Laterality: N/A;   RIGHT/LEFT HEART CATH AND CORONARY ANGIOGRAPHY N/A 09/20/2020   Procedure: RIGHT/LEFT HEART CATH AND CORONARY ANGIOGRAPHY;  Surgeon: Leonie Man, MD;  Location: [place] [facility];  Service: Cardiovascular;  Laterality: N/A;   TEE WITHOUT CARDIOVERSION N/A 09/27/2020   Procedure: TRANSESOPHAGEAL ECHOCARDIOGRAM (TEE);  Surgeon: Wonda Olds, MD;  [facility];  Service: Thoracic;  Laterality: N/A;   TEE WITHOUT CARDIOVERSION N/A 03/24/2022   Procedure: TRANSESOPHAGEAL ECHOCARDIOGRAM (TEE);  Surgeon: Hebert Soho, DO;  Location: [facility];  Service: Cardiovascular;  Laterality: N/A;   TEE WITHOUT CARDIOVERSION N/A 06/23/2022   Procedure: TRANSESOPHAGEAL ECHOCARDIOGRAM (TEE);  Surgeon: Melida Quitter, MD;  Location: [place] [facility];  Service: Cardiovascular;  Laterality: N/A;   TRACHEOSTOMY  2014   09/30/12-10/20/12   VENTRAL HERNIA REPAIR N/A 07/16/2017   Procedure: LAPAROSCOPIC VENTRAL HERNIA REPAIR WITH MESH;  Surgeon: [name], MD;  Location: [place];  Service: General;  Laterality: N/A;   wrist aspiration  02/16/2012    monosodium urate crystals; Dr Caralyn Guile    Current Medications: Current Meds  Medication Sig   ACCU-CHEK AVIVA PLUS test strip TEST BLOOD SUGAR TWICE DAILY AS DIRECTED   acetaminophen (TYLENOL) 500 MG tablet Take 1,000 mg by mouth every 6 (six) hours as needed for moderate pain.   albuterol (VENTOLIN HFA) 108 (90 Base) MCG/ACT inhaler Inhale 2 puffs into the lungs every  4 (four) hours as needed for wheezing or shortness of breath.   allopurinol (ZYLOPRIM) 100 MG tablet Take 50 mg by mouth in the morning.   apixaban (ELIQUIS) 5 MG TABS tablet Take 1 tablet (5 mg total) by mouth 2 (two) times daily.   bismuth subsalicylate (PEPTO BISMOL) 262 MG/15ML suspension Take 30 mLs by mouth every 6 (six) hours as needed for indigestion.   colchicine (COLCRYS) 0.6 MG tablet Take 2 tabs once and then one tab one hour later as needed for gout   ergocalciferol (VITAMIN D2) 1.25 MG (50000 UT) capsule Take 50,000 Units by mouth every Monday. Monday   hydrALAZINE (APRESOLINE) 25 MG tablet Take 1 tablet (25 mg total) by mouth 3 (three) times daily.   metolazone (ZAROXOLYN) 5 MG tablet Take 1 tablet (5 mg total) by mouth once a week. Take (1) tablet once a week on the same day of the week   rosuvastatin (CRESTOR) 5 MG tablet Take 1 tablet (5 mg total) by mouth daily.   Tafamidis (VYNDAMAX) 61 MG CAPS Take 1 capsule (61 mg total) by mouth daily.   Torsemide 40 MG TABS  Take 40 mg by mouth daily.   TRUEplus Lancets 33G MISC USE 4 (FOUR) TIMES DAILY   [DISCONTINUED] sodium bicarbonate 650 MG tablet Take 1 tablet by mouth twice daily     Allergies:   Hydrochlorothiazide   Social History   Socioeconomic History   Marital status: Married    Spouse name: [name]   Number of children: 2   Years of education: 12   Highest education level: 12th grade  Occupational History   Not on file  Tobacco Use   Smoking status: Former    Packs/day: 0.25    Years: 1.00    Additional pack years: 0.00    Total pack years: 0.25    Types: Cigarettes    Quit date: 05/15/1968    Years since quitting: 54.2   Smokeless tobacco: Never   Tobacco comments:    smoked [place], up to 1-2 cigarettes/ day  Vaping Use   Vaping Use: Never used  Substance and Sexual Activity   Alcohol use: No    Alcohol/week: 0.0 standard drinks of alcohol   Drug use: No   Sexual activity: Yes  Other Topics  Concern   Not on file  Social History Narrative   Walks three times a week, 2-3 miles, occasional walks on treadmill       Patient is right-handed. Corey [name] lives with his wife in a one level home. Corey [name] drinks 1 diet Mt. Dew a day and rarely has coffee. Corey [name] walks 3 x a week for exercise.   Social Determinants of Health   Financial Resource Strain: Low Risk  (02/02/2022)   Overall Financial Resource Strain (CARDIA)    Difficulty of Paying Living Expenses: Not hard at all  Food Insecurity: No Food Insecurity (02/02/2022)   Hunger Vital Sign    Worried About Running Out of Food in the Last Year: Never true    Ran Out of Food in the Last Year: Never true  Transportation Needs: No Transportation Needs (05/01/2022)   PRAPARE - Hydrologist (Medical): No    Lack of Transportation (Non-Medical): No  Physical Activity: Inactive (02/02/2022)   Exercise Vital Sign    Days of Exercise per Week: 0 days    Minutes of Exercise per Session: 0 min  Stress: No Stress Concern Present (02/02/2022)   [place]    Feeling of Stress : Not at all  Social Connections: [place] (02/02/2022)   Social Connection and Isolation Panel [NHANES]    Frequency of Communication with Friends and Family: More than three times a week    Frequency of Social Gatherings with Friends and Family: More than three times a week    Attends Religious Services: More than 4 times per year    Active Member of [employer] or Organizations: Yes    Attends Music therapist: More than 4 times per year    Marital Status: Married     Family History: The patient's family history includes Cancer in his father; Diabetes in his father and mother; Heart failure in his brother and mother; Hypertension in his father and mother; Prostate cancer in his father. There is no history of Stroke, Heart disease, or Colon cancer.  ROS:   Please see the  history of present illness.    All other systems reviewed and are negative.  EKGs/Labs/Other Studies Reviewed Today:    TTE 03/23/2022 Speckled appearance consistent with amyloid Global hypokinesis, EF 30-35%,  severe LVH LA and RA are moderately dilated  EKG:  Last EKG results: today - atrial flutter, atypical   Recent Labs: 01/05/2022: ALT 31; Pro B Natriuretic peptide (BNP) 1,740.0 03/22/2022: B Natriuretic Peptide 1,571.3 06/14/2022: Hemoglobin 11.7; Platelets 129 07/03/2022: BUN 97; Creatinine, Ser 3.52; Potassium 3.3; Sodium 141     Physical Exam:    VS:  BP 110/66   Pulse (!) 55   Ht 5\' 6"  (1.676 m)   Wt 158 lb (71.7 kg)   BMI 25.50 kg/m     Wt Readings from Last 3 Encounters:  08/02/22 161 lb (73 kg)  07/24/22 158 lb (71.7 kg)  06/23/22 170 lb (77.1 kg)     GEN:  Well nourished, well developed in no acute distress CARDIAC: RRR, no murmurs, rubs, gallops RESPIRATORY:  Normal work of breathing MUSCULOSKELETAL: no edema    ASSESSMENT & PLAN:    Atrial flutter and fibrillation: Pt has multiple flutter circuits not amenable to ablation. Secondary hypercoagulable state: continue apixaban. CHFrEF: EF has remained severely depressed at < 35% despite GDMT for over 3 months.  We discussed options for primary prevention of sudden cardiac death. The presence of an A-V fistula limits transvenous access and increases infection risk for transvenous ICD. Given this, I recommended subcutaneous ICD. cardiac amyloid. ESRD with fistula: left-side fistula.        Medication Adjustments/Labs and Tests Ordered: Current medicines are reviewed at length with the patient today.  Concerns regarding medicines are outlined above.  Orders Placed This Encounter  Procedures   EKG 12-Lead   No orders of the defined types were placed in this encounter.    Signed, Melida Quitter, MD  08/15/2022 9:51 PM    [place]

## 2022-07-24 NOTE — Patient Instructions (Addendum)
Medication Instructions:  Your physician recommends that you continue on your current medications as directed. Please refer to the Current Medication list given to you today.  *If you need a refill on your cardiac medications before your next appointment, please call your pharmacy*  Lab Work: None ordered.  If you have labs (blood work) drawn today and your tests are completely normal, you will receive your results only by: [place] (if you have MyChart) OR A paper copy in the mail If you have any lab test that is abnormal or we need to change your treatment, we will call you to review the results.  Testing/Procedures: None ordered.  Follow-Up: At [facility], you and your health needs are our priority.  As part of our continuing mission to provide you with exceptional heart care, we have created designated Provider Care Teams.  These Care Teams include your primary Cardiologist (physician) and Advanced Practice Providers (APPs -  Physician Assistants and Nurse Practitioners) who all work together to provide you with the care you need, when you need it.  We recommend signing up for the patient portal called "MyChart".  Sign up information is provided on this After Visit Summary.  MyChart is used to connect with patients for Virtual Visits (Telemedicine).  Patients are able to view lab/test results, encounter notes, upcoming appointments, etc.  Non-urgent messages can be sent to your provider as well.   To learn more about what you can do with MyChart, go to [email or web address].    Your next appointment:   Please schedule a follow up appointment for 3 months with Dr. Norberto Sorenson.    The format for your next appointment:   In Person  Provider:   Doralee Albino, MD{or one of the following Advanced Practice Providers on your designated Care Team:   Tommye Standard, [place] Legrand Como "[facility]" [place], [place]

## 2022-07-28 ENCOUNTER — Telehealth: Payer: Self-pay

## 2022-07-28 NOTE — Telephone Encounter (Signed)
Pt has been scheduled for an S-ICD Implant with Dr. Myles Gip on 4/22. He will have labs done at visit with [facility] on 4/8...  He will need scrub and letter at that time.

## 2022-07-31 ENCOUNTER — Other Ambulatory Visit: Payer: Self-pay | Admitting: Internal Medicine

## 2022-08-02 ENCOUNTER — Ambulatory Visit (HOSPITAL_COMMUNITY)
Admission: RE | Admit: 2022-08-02 | Discharge: 2022-08-02 | Disposition: A | Discharge status: Home / Self Care | Payer: Medicare HMO | Source: Ambulatory Visit | Attending: Vascular Surgery | Admitting: Vascular Surgery

## 2022-08-02 ENCOUNTER — Ambulatory Visit: Payer: Medicare HMO | Admitting: Physician Assistant

## 2022-08-02 VITALS — BP 134/73 | HR 44 | Temp 97.8°F | Wt 161.0 lb

## 2022-08-02 DIAGNOSIS — N185 Chronic kidney disease, stage 5: Secondary | ICD-10-CM

## 2022-08-02 NOTE — Progress Notes (Signed)
Office Note     CC:  follow up Requesting Provider:  Fay Records, MD  HPI: Corey [name] is a 77 y.o. (Apr 09, 1946) male who presents for evaluation of left arm AV fistula.  He underwent left brachiocephalic fistula creation by Dr. Donzetta Matters on 01/31/2022.  He then underwent fistulogram on 04/28/2022 involving drug-coated balloon angioplasty of several areas of the fistula.  He has not yet started dialysis.   Past Medical History:  Diagnosis Date   Cataract    CHF (congestive heart failure) (HCC)    Chronic heart failure with preserved ejection fraction (HFpEF) (HCC)    Colon polyp 2003   Dr [name]; F/U was to be 2008( not completed)   Coronary artery disease    CVA (cerebral infarction) 2014   Diabetes mellitus    Diverticulosis 2003   Dyspnea    with exertion   Gout    Hereditary cardiac amyloidosis (HCC)    Hypertension    Myocardial infarction ([place]) 09/19/2012   PEA cardiac arrest in setting of acute respiratory failure/pulmonary edema   OSA (obstructive sleep apnea) 03/27/2018   Pneumonia 09/29/2011   Avelox X 10 days as OP   Prostate cancer ([place])    Renal insufficiency    Seizures ([place]) 09/19/2012   not treated for seizure disorder; had a seizure after stroke 2014; no seizure since then   Stroke [facility])     Past Surgical History:  Procedure Laterality Date   A-FLUTTER ABLATION N/A 06/23/2022   Procedure: A-FLUTTER ABLATION;  Surgeon: Melida Quitter, MD;  Location: [place] [facility];  Service: Cardiovascular;  Laterality: N/A;   A/V FISTULAGRAM Left 04/28/2022   Procedure: A/V Fistulagram;  Surgeon: Angelia Mould, MD;  Location: [place] [facility];  Service: Cardiovascular;  Laterality: Left;   AV FISTULA PLACEMENT Left 01/31/2022   Procedure: LEFT ARM ARTERIOVENOUS (AV) FISTULA CREATION;  Surgeon: Waynetta Sandy, MD;  Location: [place];  Service: Vascular;  Laterality: Left;  regional block to left arm   CARDIOVERSION N/A 03/24/2022   Procedure:  CARDIOVERSION;  Surgeon: Hebert Soho, DO;  Location: [place] [facility];  Service: Cardiovascular;  Laterality: N/A;   COLONOSCOPY W/ POLYPECTOMY  2003   no F/U ([place] discussed 12/03/12)   INGUINAL HERNIA REPAIR Left 06/16/2021   Procedure: LAPAROSCOPIC, POSSIBLY OPEN LEFT INGUINAL HERNIA REPAIR WITH MESH;  Surgeon: Felicie Morn, MD;  Location: [facility];  Service: General;  Laterality: Left;   INSERTION OF MESH N/A 07/16/2017   Procedure: INSERTION OF MESH;  Surgeon: [name], MD;  Location: [place];  Service: General;  Laterality: N/A;   PEG PLACEMENT  2014   10/08/12-12/10/12   PEG TUBE REMOVAL  2014   PERIPHERAL VASCULAR BALLOON ANGIOPLASTY  04/28/2022   Procedure: PERIPHERAL VASCULAR BALLOON ANGIOPLASTY;  Surgeon: Angelia Mould, MD;  Location: [place] [facility];  Service: Cardiovascular;;   PROSTATE BIOPSY     RIGHT HEART CATH N/A 03/24/2022   Procedure: RIGHT HEART CATH;  Surgeon: Hebert Soho, DO;  Location: [place] [facility];  Service: Cardiovascular;  Laterality: N/A;   RIGHT/LEFT HEART CATH AND CORONARY ANGIOGRAPHY N/A 09/20/2020   Procedure: RIGHT/LEFT HEART CATH AND CORONARY ANGIOGRAPHY;  Surgeon: Leonie Man, MD;  Location: [place] [facility];  Service: Cardiovascular;  Laterality: N/A;   TEE WITHOUT CARDIOVERSION N/A 09/27/2020   Procedure: TRANSESOPHAGEAL ECHOCARDIOGRAM (TEE);  Surgeon: Wonda Olds, MD;  Location: [place];  Service: Thoracic;  Laterality: N/A;  TEE WITHOUT CARDIOVERSION N/A 03/24/2022   Procedure: TRANSESOPHAGEAL ECHOCARDIOGRAM (TEE);  Surgeon: Hebert Soho, DO;  Location: [facility];  Service: Cardiovascular;  Laterality: N/A;   TEE WITHOUT CARDIOVERSION N/A 06/23/2022   Procedure: TRANSESOPHAGEAL ECHOCARDIOGRAM (TEE);  Surgeon: Melida Quitter, MD;  Location: [place] [facility];  Service: Cardiovascular;  Laterality: N/A;   TRACHEOSTOMY  2014   09/30/12-10/20/12   VENTRAL HERNIA REPAIR N/A 07/16/2017   Procedure:  LAPAROSCOPIC VENTRAL HERNIA REPAIR WITH MESH;  Surgeon: [name], MD;  Location: [facility];  Service: General;  Laterality: N/A;   wrist aspiration  02/16/2012    monosodium urate crystals; Dr Caralyn Guile    Social History   Socioeconomic History   Marital status: Married    Spouse name: [name]   Number of children: 2   Years of education: 12   Highest education level: 12th grade  Occupational History   Not on file  Tobacco Use   Smoking status: Former    Packs/day: 0.25    Years: 1.00    Additional pack years: 0.00    Total pack years: 0.25    Types: Cigarettes    Quit date: 05/15/1968    Years since quitting: 54.2   Smokeless tobacco: Never   Tobacco comments:    smoked [place], up to 1-2 cigarettes/ day  Vaping Use   Vaping Use: Never used  Substance and Sexual Activity   Alcohol use: No    Alcohol/week: 0.0 standard drinks of alcohol   Drug use: No   Sexual activity: Yes  Other Topics Concern   Not on file  Social History Narrative   Walks three times a week, 2-3 miles, occasional walks on treadmill       Patient is right-handed. He lives with his wife in a one level home. He drinks 1 diet Mt. Dew a day and rarely has coffee. He walks 3 x a week for exercise.   Social Determinants of Health   Financial Resource Strain: Low Risk  (02/02/2022)   Overall Financial Resource Strain (CARDIA)    Difficulty of Paying Living Expenses: Not hard at all  Food Insecurity: No Food Insecurity (02/02/2022)   Hunger Vital Sign    Worried About Running Out of Food in the Last Year: Never true    Ran Out of Food in the Last Year: Never true  Transportation Needs: No Transportation Needs (05/01/2022)   PRAPARE - Hydrologist (Medical): No    Lack of Transportation (Non-Medical): No  Physical Activity: Inactive (02/02/2022)   Exercise Vital Sign    Days of Exercise per Week: 0 days    Minutes of Exercise per Session: 0 min  Stress: No Stress  Concern Present (02/02/2022)   [place]    Feeling of Stress : Not at all  Social Connections: [place] (02/02/2022)   Social Connection and Isolation Panel [NHANES]    Frequency of Communication with Friends and Family: More than three times a week    Frequency of Social Gatherings with Friends and Family: More than three times a week    Attends Religious Services: More than 4 times per year    Active Member of [employer] or Organizations: Yes    Attends Archivist Meetings: More than 4 times per year    Marital Status: Married  Human resources officer Violence: Not At Risk (02/02/2022)   Humiliation, Afraid, Rape, and Kick questionnaire  Fear of Current or Ex-Partner: No    Emotionally Abused: No    Physically Abused: No    Sexually Abused: No    Family History  Problem Relation Age of Onset   Heart failure Mother    Hypertension Mother    Diabetes Mother    Hypertension Father    Prostate cancer Father    Diabetes Father    Cancer Father    Heart failure Brother    Stroke Neg Hx    Heart disease Neg Hx    Colon cancer Neg Hx     Current Outpatient Medications  Medication Sig Dispense Refill   ACCU-CHEK AVIVA PLUS test strip TEST BLOOD SUGAR TWICE DAILY AS DIRECTED 200 strip 1   acetaminophen (TYLENOL) 500 MG tablet Take 1,000 mg by mouth every 6 (six) hours as needed for moderate pain.     albuterol (VENTOLIN HFA) 108 (90 Base) MCG/ACT inhaler Inhale 2 puffs into the lungs every 4 (four) hours as needed for wheezing or shortness of breath. 18 g 1   allopurinol (ZYLOPRIM) 100 MG tablet Take 50 mg by mouth in the morning.     apixaban (ELIQUIS) 5 MG TABS tablet Take 1 tablet (5 mg total) by mouth 2 (two) times daily. 60 tablet 6   bismuth subsalicylate (PEPTO BISMOL) 262 MG/15ML suspension Take 30 mLs by mouth every 6 (six) hours as needed for indigestion.     colchicine (COLCRYS) 0.6 MG tablet Take  2 tabs once and then one tab one hour later as needed for gout 15 tablet 2   ergocalciferol (VITAMIN D2) 1.25 MG (50000 UT) capsule Take 50,000 Units by mouth every Monday. Monday     hydrALAZINE (APRESOLINE) 25 MG tablet Take 1 tablet (25 mg total) by mouth 3 (three) times daily. 90 tablet 0   metolazone (ZAROXOLYN) 5 MG tablet Take 1 tablet (5 mg total) by mouth once a week. Take (1) tablet once a week on the same day of the week 15 tablet 2   rosuvastatin (CRESTOR) 5 MG tablet Take 1 tablet (5 mg total) by mouth daily. 90 tablet 3   sodium bicarbonate 650 MG tablet Take 1 tablet by mouth twice daily 60 tablet 0   Tafamidis (VYNDAMAX) 61 MG CAPS Take 1 capsule (61 mg total) by mouth daily. 30 capsule 12   Torsemide 40 MG TABS Take 40 mg by mouth daily. 180 tablet 3   TRUEplus Lancets 33G MISC USE 4 (FOUR) TIMES DAILY 400 each 2   No current facility-administered medications for this visit.    Allergies  Allergen Reactions   Hydrochlorothiazide Other (See Comments)    Gout , uncontrolled diabetes and renal insufficiency     REVIEW OF SYSTEMS:   [X]  denotes positive finding, [ ]  denotes negative finding Cardiac  Comments:  Chest pain or chest pressure:    Shortness of breath upon exertion:    Short of breath when lying flat:    Irregular heart rhythm:        Vascular    Pain in calf, thigh, or hip brought on by ambulation:    Pain in feet at night that wakes you up from your sleep:     Blood clot in your veins:    Leg swelling:         Pulmonary    Oxygen at home:    Productive cough:     Wheezing:         Neurologic    Sudden  weakness in arms or legs:     Sudden numbness in arms or legs:     Sudden onset of difficulty speaking or slurred speech:    Temporary loss of vision in one eye:     Problems with dizziness:         Gastrointestinal    Blood in stool:     Vomited blood:         Genitourinary    Burning when urinating:     Blood in urine:        Psychiatric     Major depression:         Hematologic    Bleeding problems:    Problems with blood clotting too easily:        Skin    Rashes or ulcers:        Constitutional    Fever or chills:      PHYSICAL EXAMINATION:  Vitals:   08/02/22 1118  BP: 134/73  Pulse: (!) 44  Temp: 97.8 F (36.6 C)  TempSrc: Temporal  SpO2: 98%  Weight: 161 lb (73 kg)    General:  WDWN in NAD; vital signs documented above Gait: Not observed HENT: WNL, normocephalic Pulmonary: normal non-labored breathing , without Rales, rhonchi,  wheezing Cardiac: regular HR Abdomen: soft, NT, no masses Skin: without rashes Vascular Exam/Pulses: Palpable left radial pulse; palpable thrill near the anastomosis which weakens as you approach the axilla Extremities: without ischemic changes, without Gangrene , without cellulitis; without open wounds;  Musculoskeletal: no muscle wasting or atrophy  Neurologic: A&O X 3;  No focal weakness or paresthesias are detected Psychiatric:  The pt has Normal affect.   Non-Invasive Vascular Imaging:   Patent left brachiocephalic fistula with low velocities throughout    ASSESSMENT/PLAN:: 77 y.o. male here for follow up for evaluation of left brachiocephalic fistula  -Patient still has not yet started dialysis.  He however has already underwent fistulogram for drug coated balloon angioplasty of several areas of stenosis.  On exam there is a strong thrill near the anastomosis which weakens as you approach the axilla.  On duplex he has low flow velocities throughout the fistula.  We discussed how the fistula is patent however is not perfect.  We also discussed the risk of kidney injury with repeated contrast exposure which would be involved with a repeat fistulogram.  Ultimately we decided to hold off on repeat fistulogram.  I believe it is okay to cannulate fistula if hemodialysis is initiated.  If there is any trouble with cannulating or completing a dialysis treatment we can then  perform a fistulogram.  For now he will follow-up on an as-needed basis.   Dagoberto Ligas, PA-C [facility] [phone]  Clinic MD:   Donzetta Matters

## 2022-08-04 ENCOUNTER — Ambulatory Visit: Payer: Medicare HMO | Attending: Internal Medicine

## 2022-08-09 ENCOUNTER — Other Ambulatory Visit (HOSPITAL_COMMUNITY): Payer: Self-pay

## 2022-08-15 ENCOUNTER — Other Ambulatory Visit: Payer: Medicare HMO

## 2022-08-15 VITALS — BP 126/72 | HR 51 | Temp 97.0°F

## 2022-08-15 DIAGNOSIS — Z515 Encounter for palliative care: Secondary | ICD-10-CM

## 2022-08-15 NOTE — Progress Notes (Signed)
PATIENT NAME: Corey [name] DOB: 30-Jun-1945 MRN: [id number]  PRIMARY CARE PROVIDER: Pricilla Riffle, MD  RESPONSIBLE PARTY:  Acct ID - Guarantor Home Phone Work Phone Relationship Acct Type  [id number] CELESTINO, TURNLEY [phone]  Self P/F     [address] [place], [place] [address]   Palliative Care Follow Up Encounter Note    Completed home visit with Clydia Llano, SW. Wife Dedra Skeens also present.   Cognitive: wife reports pt will begin to cook, go do something else and forget that he was cooking; she is concerned he will start a fire; LPN, SW and wife discussed options to discourage cooking when she is not at home   Appetite: eats 1 reduced meal at breakfast and 1 full meal at dinner and snacks throughout the day; drinks fluids throughout the day; restricted to 56oz daily; pt reports he drinks more than he should because he's thirsty; LPN encouraged pt to suck on ice to satisfy thirst instead of drinking another 8 oz of fluid; pt acknowledged suggestion   Mobility: walks without assist but doesn't walk as much as he used to due to SOB   GI/GU: has a BM every few days; educated pt on the importance of not straining when trying to have a BM; LPN will contact Dr. Anne Fu for suggestions; denies urinary issues   Respiratory: gets SOB when ambulating; reports he will sit down and rest if he feels the need   Sleeping Pattern: nods off during the day and doesn't sleep well at night because it's hard to go back to sleep and it's hard to get in the bed again   Pain: denies pain at this time; pt takes Tylenol when he has back pain     CODE STATUS: Full Code  ADVANCED DIRECTIVES: N MOST FORM: N PPS: 60%   PHYSICAL EXAM:   VITALS: Today's Vitals   08/15/22 1103  BP: 126/72  Pulse: (!) 51  Temp: (!) 97 F (36.1 C)  TempSrc: Temporal  SpO2: 94%  PainSc: 0-No pain    LUNGS: clear to auscultation  CARDIAC: Cor irreg, irreg RRR and Cor Brady EXTREMITIES: BLE trace edema SKIN: cool,  dry, NEURO: weakness       Aliyanna Wassmer Clementeen Graham, LPN

## 2022-08-15 NOTE — Progress Notes (Signed)
COMMUNITY PALLIATIVE CARE SW NOTE  PATIENT NAME: Corey [name] DOB: 1946/05/06 MRN: [id number]  PRIMARY CARE PROVIDER: Fay Records, MD  RESPONSIBLE PARTY:  Acct ID - Guarantor Home Phone Work Phone Relationship Acct Type  [id number] [name], [name]  Self P/F     [address], [place], [place] [address]   Palliative Care Visit/Clinical Social Work  SW and nurse-D. Georgann Housekeeper completed a joint follow-up visit with patient at his home. Patient answered the door. He is ambulating independently. He reported that he was doing alright. Patient is scheduled to have a defibrillator in this month on 09/04/22 @ [facility]. Patient report fatigue with any activity. Patient denies pain. Patient has trace edema to his feet.  He has swelling to his left hand, which he reports is ongoing.  His appetite remains good. He is eating three good meals a day with snacks. His fluid intake is good, where he is drinking no more than 7-8oz bottles of water a day. He report that he is drinking more liquids which causes him to have more edema. The nurse suggested that patient try eating ice. Patient report that he does have back pain when he walks, which he manages with Tylenol. Patient has regular bowel movements, which is hard. Patient report that he is not sleeping through the night due to having to go back and forth to the bathroom.  SW followed up on advance directives paperwork previously provided to them. Patient has not completed. SW strongly encouraged them to review and complete it at this time as he anticipates getting a defibrillator. Patient's wife is working with [facility] to set up transportation for patient possibly going to dialysis.   Next scheduled appointment: 09/21/22 @ 11am.    Social History   Tobacco Use   Smoking status: Former    Packs/day: 0.25    Years: 1.00    Additional pack years: 0.00    Total pack years: 0.25    Types: Cigarettes    Quit date: 05/15/1968    Years since quitting: 54.2    Smokeless tobacco: Never   Tobacco comments:    smoked [place], up to 1-2 cigarettes/ day  Substance Use Topics   Alcohol use: No    Alcohol/week: 0.0 standard drinks of alcohol    CODE STATUS: Full Code ADVANCED DIRECTIVES: No MOST FORM COMPLETE: No, but discussed HOSPICE EDUCATION PROVIDED: No  Duration of visit and documentation: 60 minutes  Cleburn Maiolo, LCSW

## 2022-08-18 ENCOUNTER — Telehealth: Payer: Self-pay

## 2022-08-18 NOTE — Telephone Encounter (Signed)
Spoke with patient about having to change time for SQ ICD placement. Cath lab notified. Letter in patient chart. No questions at this time

## 2022-08-20 NOTE — Progress Notes (Unsigned)
Electrophysiology Office Note:    Date:  08/20/2022   ID:  Corey [name], DOB 03/08/1946, MRN [id number]  PCP:  Corey [name], Corey V, MD   [facility] Providers Cardiologist:  Corey SchultzMark Skains, MD Electrophysiologist:  Corey SmallAugustus E Kaitlynn Tramontana, MD     Referring MD: Corey [name], Corey V, MD   History of Present Illness:    Corey [name] is a 77 y.o. male with a hx detailed below, significant for ATTR cardiac amyloid, pericardial effusion status post window, CHFrEF, CAD ESRD approaching dialysis, and atrial fibrillation and flutter referred for arrhythmia management.  In 2022, Corey [name] presented to [facility] with DOE/angina. LHC at that time demonstrated 80% occlusion of the LAD, however, PCI was deferred due to markedly elevated LVEDP. LVEF by echo at that time of 35%-40%. Due to a large pericardial effusion, he had a window done with biopsies at that time compatible with amyloidosis. AL evaluation was negative. PYP was equivocal. Genetic testing consistent with Val142Ile TTR.   He was admitted in early November 2023 with volume overload and an atrial fibrillation occasionally organizing into flutter. He was diuresed, underwent TEE/DCCV x 2. When in sinus rhythm, he was often bradycardic with sinus brady and Mobitz I AV block. I saw him a few times during this admission and recommended placement of a holter monitor on discharge to assess chronotropic competence.  He underwent EP study and ablation for atrial flutter in March, 2024. He had multiple arrhythmias -- multiple flutters involving variable circuits as well as a transient focal tachycardia. I performed a CTI ablation as at least one of the flutters involved the cavo-tricuspid isthmus. I elected not to pursue all the multiple flutter circuits. Today he has an atypical atrial flutter. He continues to have shortness of breath and fatigue but no palpitations.  Past Medical History:  Diagnosis Date   Cataract    CHF (congestive heart failure)     Chronic heart failure with preserved ejection fraction (HFpEF)    Colon polyp 2003   Dr Corey [name] ; F/U was to be 2008( not completed)   Coronary artery disease    CVA (cerebral infarction) 2014   Diabetes mellitus    Diverticulosis 2003   Dyspnea    with exertion   Gout    Hereditary cardiac amyloidosis    Hypertension    Myocardial infarction 09/19/2012   PEA cardiac arrest in setting of acute respiratory failure/pulmonary edema   OSA (obstructive sleep apnea) 03/27/2018   Pneumonia 09/29/2011   Avelox X 10 days as OP   Prostate cancer    Renal insufficiency    Seizures 09/19/2012   not treated for seizure disorder; had a seizure after stroke 2014; no seizure since then   Stroke     Past Surgical History:  Procedure Laterality Date   A-FLUTTER ABLATION N/A 06/23/2022   Procedure: A-FLUTTER ABLATION;  Surgeon: Corey SmallMealor, Jacarius Handel E, MD;  Location: [facility] INVASIVE CV LAB;  Service: Cardiovascular;  Laterality: N/A;   A/V FISTULAGRAM Left 04/28/2022   Procedure: A/V Fistulagram;  Surgeon: Corey [name], Corey S, MD;  [facility];  Service: Cardiovascular;  Laterality: Left;   AV FISTULA PLACEMENT Left 01/31/2022   Procedure: LEFT ARM ARTERIOVENOUS (AV) FISTULA CREATION;  Surgeon: Corey [name], Corey Christopher, MD;  [facility];  Service: Vascular;  Laterality: Left;  regional block to left arm   CARDIOVERSION N/A 03/24/2022   Procedure: CARDIOVERSION;  Surgeon: Corey NettlesSabharwal, Aditya, DO;  Location: [facility] ENDOSCOPY;  Service: Cardiovascular;  Laterality:  N/A;   COLONOSCOPY W/ POLYPECTOMY  2003   no F/U (SOC discussed 12/03/12)   INGUINAL HERNIA REPAIR Left 06/16/2021   Procedure: LAPAROSCOPIC, POSSIBLY OPEN LEFT INGUINAL HERNIA REPAIR WITH MESH;  Surgeon: Corey Ore, MD;  Location: [facility] ORS;  Service: General;  Laterality: Left;   INSERTION OF MESH N/A 07/16/2017   Procedure: INSERTION OF MESH;  Surgeon: Corey Bue, MD;  Location: [facility] OR;  Service: General;   Laterality: N/A;   PEG PLACEMENT  2014   10/08/12-12/10/12   PEG TUBE REMOVAL  2014   PERIPHERAL VASCULAR BALLOON ANGIOPLASTY  04/28/2022   Procedure: PERIPHERAL VASCULAR BALLOON ANGIOPLASTY;  Surgeon: Corey Hint, MD;  Location: [facility] CV LAB;  Service: Cardiovascular;;   PROSTATE BIOPSY     RIGHT HEART CATH N/A 03/24/2022   Procedure: RIGHT HEART CATH;  Surgeon: Corey Nettles, DO;  Location: [facility];  Service: Cardiovascular;  Laterality: N/A;   RIGHT/LEFT HEART CATH AND CORONARY ANGIOGRAPHY N/A 09/20/2020   Procedure: RIGHT/LEFT HEART CATH AND CORONARY ANGIOGRAPHY;  Surgeon: Corey Lex, MD;  Location: [facility] INVASIVE CV LAB;  Service: Cardiovascular;  Laterality: N/A;   TEE WITHOUT CARDIOVERSION N/A 09/27/2020   Procedure: TRANSESOPHAGEAL ECHOCARDIOGRAM (TEE);  Surgeon: Corey Dolin, MD;  Location: [facility] OR;  Service: Thoracic;  Laterality: N/A;   TEE WITHOUT CARDIOVERSION N/A 03/24/2022   Procedure: TRANSESOPHAGEAL ECHOCARDIOGRAM (TEE);  Surgeon: Corey Nettles, DO;  Location: [facility] ENDOS[facility];  Service: Cardiovascular;  Laterality: N/A;   TEE WITHOUT CARDIOVERSION N/A 06/23/2022   Procedure: TRANSESOPHAGEAL ECHOCARDIOGRAM (TEE);  Surgeon: Corey Small, MD;  Location: [facility] INVASIVE CV LAB;  Service: Cardiovascular;  Laterality: N/A;   TRACHEOSTOMY  2014   09/30/12-10/20/12   VENTRAL HERNIA REPAIR N/A 07/16/2017   Procedure: LAPAROSCOPIC VENTRAL HERNIA REPAIR WITH MESH;  Surgeon: Corey Bue, MD;  Location: [facility] OR;  Service: General;  Laterality: N/A;   wrist aspiration  02/16/2012    monosodium urate crystals; Dr Melvyn Novas    Current Medications: No outpatient medications have been marked as taking for the 08/21/22 encounter (Appointment) with Corey [name], Corey Gaudy, MD.     Allergies:   Hydrochlorothiazide   Social History   Socioeconomic History   Marital status: Married    Spouse name: [name]   Number of children: 2   Years of education: 12    Highest education level: 12th grade  Occupational History   Not on file  Tobacco Use   Smoking status: Former    Packs/day: 0.25    Years: 1.00    Additional pack years: 0.00    Total pack years: 0.25    Types: Cigarettes    Quit date: 05/15/1968    Years since quitting: 54.3   Smokeless tobacco: Never   Tobacco comments:    smoked 1969- 1970, up to 1-2 cigarettes/ day  Vaping Use   Vaping Use: Never used  Substance and Sexual Activity   Alcohol use: No    Alcohol/week: 0.0 standard drinks of alcohol   Drug use: No   Sexual activity: Yes  Other Topics Concern   Not on file  Social History Narrative   Walks three times a week, 2-3 miles, occasional walks on treadmill       Patient is right-handed. He lives with his wife in a one level home. He drinks 1 diet Mt. Dew a day and rarely has coffee. He walks 3 x a week for exercise.   Social Determinants of Health  Financial Resource Strain: Low Risk  (02/02/2022)   Overall Financial Resource Strain (CARDIA)    Difficulty of Paying Living Expenses: Not hard at all  Food Insecurity: No Food Insecurity (02/02/2022)   Hunger Vital Sign    Worried About Running Out of Food in the Last Year: Never true    Ran Out of Food in the Last Year: Never true  Transportation Needs: No Transportation Needs (05/01/2022)   PRAPARE - Administrator, Civil Service (Medical): No    Lack of Transportation (Non-Medical): No  Physical Activity: Inactive (02/02/2022)   Exercise Vital Sign    Days of Exercise per Week: 0 days    Minutes of Exercise per Session: 0 min  Stress: No Stress Concern Present (02/02/2022)   [employer] of Occupational Health - Occupational Stress Questionnaire    Feeling of Stress : Not at all  Social Connections: Socially Integrated (02/02/2022)   Social Connection and Isolation Panel [NHANES]    Frequency of Communication with Friends and Family: More than three times a week    Frequency of Social  Gatherings with Friends and Family: More than three times a week    Attends Religious Services: More than 4 times per year    Active Member of [employer] or Organizations: Yes    Attends Engineer, structural: More than 4 times per year    Marital Status: Married     Family History: The patient's family history includes Cancer in his father; Diabetes in his father and mother; Heart failure in his brother and mother; Hypertension in his father and mother; Prostate cancer in his father. There is no history of Stroke, Heart disease, or Colon cancer.  ROS:   Please see the history of present illness.    All other systems reviewed and are negative.  EKGs/Labs/Other Studies Reviewed Today:    TTE 03/23/2022 Speckled appearance consistent with amyloid Global hypokinesis, EF 30-35%, severe LVH LA and RA are moderately dilated  EKG:  Last EKG results: today - atrial flutter, atypical   Recent Labs: 01/05/2022: ALT 31; Pro B Natriuretic peptide (BNP) 1,740.0 03/22/2022: B Natriuretic Peptide 1,571.3 06/14/2022: Hemoglobin 11.7; Platelets 129 07/03/2022: BUN 97; Creatinine, Ser 3.52; Potassium 3.3; Sodium 141     Physical Exam:    VS:  There were no vitals taken for this visit.    Wt Readings from Last 3 Encounters:  08/02/22 161 lb (73 kg)  07/24/22 158 lb (71.7 kg)  06/23/22 170 lb (77.1 kg)     GEN:  Well nourished, well developed in no acute distress CARDIAC: RRR, no murmurs, rubs, gallops RESPIRATORY:  Normal work of breathing MUSCULOSKELETAL: no edema    ASSESSMENT & PLAN:    Atrial flutter and fibrillation: Pt has multiple flutter circuits not amenable to ablation. Secondary hypercoagulable state: continue apixaban. CHFrEF: EF has remained severely depressed at < 35% despite GDMT for over 3 months.  We discussed options for primary prevention of sudden cardiac death. The presence of an A-V fistula limits transvenous access and increases infection risk for transvenous  ICD. Given this, I recommended subcutaneous ICD. cardiac amyloid. ESRD with fistula: left-side fistula.        Medication Adjustments/Labs and Tests Ordered: Current medicines are reviewed at length with the patient today.  Concerns regarding medicines are outlined above.  No orders of the defined types were placed in this encounter.  No orders of the defined types were placed in this encounter.    Signed, Jari Pigg  Lorane Gell, MD  08/20/2022 3:55 PM    [facility]

## 2022-08-21 ENCOUNTER — Ambulatory Visit: Payer: Medicare HMO | Attending: Cardiovascular Disease | Admitting: Cardiovascular Disease

## 2022-08-21 ENCOUNTER — Encounter: Payer: Self-pay | Admitting: Cardiovascular Disease

## 2022-08-21 VITALS — BP 142/80 | HR 58 | Ht 66.0 in | Wt 167.4 lb

## 2022-08-21 DIAGNOSIS — I480 Paroxysmal atrial fibrillation: Secondary | ICD-10-CM | POA: Diagnosis not present

## 2022-08-21 NOTE — Patient Instructions (Signed)
Medication Instructions:  Your physician recommends that you continue on your current medications as directed. Please refer to the Current Medication list given to you today.  *If you need a refill on your cardiac medications before your next appointment, please call your pharmacy*  Lab Work: TODAY: BMET and CBC If you have labs (blood work) drawn today and your tests are completely normal, you will receive your results only by: MyChart Message (if you have MyChart) OR A paper copy in the mail If you have any lab test that is abnormal or we need to change your treatment, we will call you to review the results.  Follow-Up: At [facility], you and your health needs are our priority.  As part of our continuing mission to provide you with exceptional heart care, we have created designated Provider Care Teams.  These Care Teams include your primary Cardiologist (physician) and Advanced Practice Providers (APPs -  Physician Assistants and Nurse Practitioners) who all work together to provide you with the care you need, when you need it.  Your next appointment:   As scheduled 

## 2022-08-22 DIAGNOSIS — N184 Chronic kidney disease, stage 4 (severe): Secondary | ICD-10-CM | POA: Diagnosis not present

## 2022-08-22 DIAGNOSIS — I3139 Other pericardial effusion (noninflammatory): Secondary | ICD-10-CM | POA: Diagnosis not present

## 2022-08-22 DIAGNOSIS — N179 Acute kidney failure, unspecified: Secondary | ICD-10-CM | POA: Diagnosis not present

## 2022-08-22 DIAGNOSIS — N2581 Secondary hyperparathyroidism of renal origin: Secondary | ICD-10-CM | POA: Diagnosis not present

## 2022-08-22 DIAGNOSIS — R809 Proteinuria, unspecified: Secondary | ICD-10-CM | POA: Diagnosis not present

## 2022-08-22 DIAGNOSIS — I130 Hypertensive heart and chronic kidney disease with heart failure and stage 1 through stage 4 chronic kidney disease, or unspecified chronic kidney disease: Secondary | ICD-10-CM | POA: Diagnosis not present

## 2022-08-22 DIAGNOSIS — I5042 Chronic combined systolic (congestive) and diastolic (congestive) heart failure: Secondary | ICD-10-CM | POA: Diagnosis not present

## 2022-08-22 DIAGNOSIS — N281 Cyst of kidney, acquired: Secondary | ICD-10-CM | POA: Diagnosis not present

## 2022-08-22 DIAGNOSIS — E1122 Type 2 diabetes mellitus with diabetic chronic kidney disease: Secondary | ICD-10-CM | POA: Diagnosis not present

## 2022-08-22 LAB — CBC WITH DIFFERENTIAL/PLATELET
Basophils Absolute: 0.1 10*3/uL (ref 0.0–0.2)
Basos: 1 %
EOS (ABSOLUTE): 0.2 10*3/uL (ref 0.0–0.4)
Eos: 5 %
Hematocrit: 35.5 % — ABNORMAL LOW (ref 37.5–51.0)
Hemoglobin: 11.3 g/dL — ABNORMAL LOW (ref 13.0–17.7)
Immature Grans (Abs): 0 10*3/uL (ref 0.0–0.1)
Immature Granulocytes: 0 %
Lymphocytes Absolute: 0.9 10*3/uL (ref 0.7–3.1)
Lymphs: 23 %
MCH: 25.7 pg — ABNORMAL LOW (ref 26.6–33.0)
MCHC: 31.8 g/dL (ref 31.5–35.7)
MCV: 81 fL (ref 79–97)
Monocytes Absolute: 0.7 10*3/uL (ref 0.1–0.9)
Monocytes: 18 %
Neutrophils Absolute: 1.9 10*3/uL (ref 1.4–7.0)
Neutrophils: 53 %
Platelets: 174 10*3/uL (ref 150–450)
RBC: 4.39 x10E6/uL (ref 4.14–5.80)
RDW: 17.6 % — ABNORMAL HIGH (ref 11.6–15.4)
WBC: 3.7 10*3/uL (ref 3.4–10.8)

## 2022-08-22 LAB — BASIC METABOLIC PANEL
BUN/Creatinine Ratio: 22 (ref 10–24)
BUN: 65 mg/dL — ABNORMAL HIGH (ref 8–27)
CO2: 22 mmol/L (ref 20–29)
Calcium: 9.9 mg/dL (ref 8.6–10.2)
Chloride: 98 mmol/L (ref 96–106)
Creatinine, Ser: 2.97 mg/dL — ABNORMAL HIGH (ref 0.76–1.27)
Glucose: 107 mg/dL — ABNORMAL HIGH (ref 70–99)
Potassium: 4.4 mmol/L (ref 3.5–5.2)
Sodium: 143 mmol/L (ref 134–144)
eGFR: 21 mL/min/{1.73_m2} — ABNORMAL LOW (ref >59)

## 2022-08-22 LAB — LAB REPORT - SCANNED: EGFR: 23

## 2022-08-23 ENCOUNTER — Telehealth: Payer: Self-pay | Admitting: Cardiology

## 2022-08-23 NOTE — Telephone Encounter (Signed)
Spoke with wife.  Pt now has palliative care assisting in the home.  She will call back if she is still in need of a letter regarding transportation.

## 2022-08-23 NOTE — Telephone Encounter (Signed)
Caller wanted to report at her last visit patient told her he was having constipation.  Caller wants to know if patient could be prescribed a stool softener.  Caller stated can call the patient directly to follow-up.

## 2022-08-23 NOTE — Telephone Encounter (Signed)
Spoke with wife Corey [name] regarding pt.  Advised typically we request pt's contact their PCP with issues r/t OTC medication to treat constipation.  Advised to increase fiber rich foods.  Wife asking about dulcolax, as he has used  that in the past with good results.  Advised wife OK to use just to be careful not to use anything too strong that causes GI upset or abdominal pain.  She was appreciative of the call and information.

## 2022-08-24 ENCOUNTER — Telehealth: Payer: Self-pay | Admitting: Pharmacist Clinician (PhC)/ Clinical Pharmacy Specialist

## 2022-08-24 ENCOUNTER — Other Ambulatory Visit: Payer: Self-pay

## 2022-08-24 ENCOUNTER — Other Ambulatory Visit (HOSPITAL_COMMUNITY): Payer: Self-pay

## 2022-08-24 NOTE — Telephone Encounter (Signed)
Spoke with wife.  Because Vyndamax is now going through [employer], Rx was moved to [facility].  Gave her the [facility] pharmacy phone number to call and ask to have it filled.

## 2022-08-24 NOTE — Telephone Encounter (Signed)
-----   Message from Cheree Ditto, [facility] sent at 08/24/2022 10:18 AM EDT ----- Regarding: FW: Vyndamax and [employer] grant  ----- Message ----- From: Sharin Grave, RN Sent: 08/23/2022   5:05 PM EDT To: [facility] Subject: Vyndamax and [employer] grant                  Pt's wife reported he is out of this medication and doesn't know where to obtain it.  She reports it was mailed to their home address in the past but he ran out last week. Documentation show 2/5 RX was sent in with [facility] grant information.  Is that something she needs to request be filled each month?  Thanks   Pam

## 2022-09-01 ENCOUNTER — Other Ambulatory Visit: Payer: Self-pay | Admitting: Internal Medicine

## 2022-09-01 NOTE — Pre-Procedure Instructions (Signed)
Spoke with wife Corey [name].  Instructed on the following items: Arrival time 1030 Nothing to eat or drink after midnight No meds AM of procedure Responsible person to drive you home and stay with you for 24 hrs Wash with special soap night before and morning of procedure If on anti-coagulant drug instructions Eliquis- last dose 4/19 PM dose.

## 2022-09-03 NOTE — Anesthesia Preprocedure Evaluation (Signed)
Anesthesia Evaluation  Patient identified by MRN, date of birth, ID band Patient awake    Reviewed: Allergy & Precautions, NPO status , Patient's Chart, lab work & pertinent test results  Airway Mallampati: III  TM Distance: >3 FB Neck ROM: Full    Dental  (+) Poor Dentition, Missing, Chipped, Dental Advisory Given,    Pulmonary shortness of breath, sleep apnea , pneumonia, former smoker   Pulmonary exam normal breath sounds clear to auscultation       Cardiovascular hypertension, Pt. on medications pulmonary hypertension+ CAD, + Past MI, +CHF and + DOE  + dysrhythmias Atrial Fibrillation + Valvular Problems/Murmurs (Mild-mod TR)  Rhythm:Regular Rate:Bradycardia  Echo 06/2022  1. Left ventricular ejection fraction, by estimation, is 30 to 35%. The left ventricle has moderately decreased function. There is severe concentric left ventricular hypertrophy. Left ventricular diastolic function could not be evaluated.   2. Right ventricular systolic function is mildly reduced. The right ventricular size is normal. Mildly increased right ventricular wall thickness.   3. Left atrial size was severely dilated. No left atrial/left atrial appendage thrombus was detected.   4. Right atrial size was moderately dilated.   5. The mitral valve is normal in structure. No evidence of mitral valve regurgitation.   6. Tricuspid valve regurgitation is mild to moderate.   7. The aortic valve is normal in structure. Aortic valve regurgitation is not visualized.     Cath  1.Low left and right heart filling pressures.  2.PCWP V waves secondary to poor left atrial compliance.  3.Normal cardiac output/index; possibly elevated due to AV fistula.  4.Mildly elevated PVR.    RECOMMENDATIONS: -Hold further diuretics   Echo 03/2022  1. Speckled appearance to LV myocardium consistent with diagnosis of amyloid Global hypokinesis worse in the posterior lateral  wall . Left ventricular ejection fraction, by estimation, is 30 to 35%. The left ventricle has moderately decreased function. The left ventricle demonstrates global hypokinesis. There is severe left ventricular hypertrophy. Left ventricular diastolic parameters are indeterminate. Elevated left ventricular end-diastolic pressure.   2. Right ventricular systolic function is normal. The right ventricular size is normal. There is moderately elevated pulmonary artery systolic pressure.   3. Left atrial size was moderately dilated.   4. Right atrial size was moderately dilated.   5. The mitral valve is normal in structure. Mild mitral valve regurgitation. No evidence of mitral stenosis.   6. The aortic valve is tricuspid. There is mild calcification of the aortic valve. There is mild thickening of the aortic valve. Aortic valve regurgitation is not visualized. Aortic valve sclerosis is present, with no evidence of aortic valve stenosis.   7. Aortic dilatation noted. There is moderate dilatation of the ascending aorta, measuring 41 mm.   8. The inferior vena cava is dilated in size with <50% respiratory variability, suggesting right atrial pressure of 15 mmHg.    SUMMARY  Severe single-vessel disease with heavily calcified mid LAD 80% stenosis  Acute Combined Systolic and Diastolic Heart Failure (systolic function appears to be reduced, to conserve contrast, LV gram not performed), but LVEDP 32 mmHg and PCWP 28 mmHg.  Systemic Hypertension with Mean Arterial Pressure 123 mmHg  Secondary Pulmonary Hypertension: PAP 67/19 mmHg-mean 39 mmHg with RVP-EDP of 67/2 mmHg-15 mmHg, RVP and RAP of 14 mmHg.  Moderate to severely reduced Cardiac Output-Index: 4.04-2.09      Neuro/Psych Seizures -,  CVA, No Residual Symptoms  negative psych ROS   GI/Hepatic negative GI ROS, Neg liver ROS,,,  Endo/Other  diabetes    Renal/GU Renal Insufficiency and ESRFRenal diseaseCKD IV     Musculoskeletal    Abdominal   Peds  Hematology  (+) Blood dyscrasia, anemia   Anesthesia Other Findings   Reproductive/Obstetrics                             Anesthesia Physical Anesthesia Plan  ASA: 4  Anesthesia Plan: General   Post-op Pain Management: Regional block* and Tylenol PO (pre-op)*   Induction: Intravenous  PONV Risk Score and Plan: 2 and Treatment may vary due to age or medical condition, Ondansetron, Midazolam and Dexamethasone  Airway Management Planned: Oral ETT  Additional Equipment: Arterial line and ClearSight  Intra-op Plan:   Post-operative Plan: Extubation in OR  Informed Consent: I have reviewed the patients History and Physical, chart, labs and discussed the procedure including the risks, benefits and alternatives for the proposed anesthesia with the patient or authorized representative who has indicated his/her understanding and acceptance.     Dental advisory given  Plan Discussed with: CRNA  Anesthesia Plan Comments:        Anesthesia Quick Evaluation

## 2022-09-04 ENCOUNTER — Ambulatory Visit (HOSPITAL_COMMUNITY): Payer: Medicare HMO | Admitting: Anesthesiology

## 2022-09-04 ENCOUNTER — Other Ambulatory Visit: Payer: Self-pay

## 2022-09-04 ENCOUNTER — Ambulatory Visit (HOSPITAL_COMMUNITY)
Admission: RE | Admit: 2022-09-04 | Discharge: 2022-09-04 | Disposition: A | Discharge status: Home / Self Care | Payer: Medicare HMO | Attending: Cardiovascular Disease | Admitting: Cardiovascular Disease

## 2022-09-04 ENCOUNTER — Ambulatory Visit (HOSPITAL_COMMUNITY): Payer: Medicare HMO

## 2022-09-04 ENCOUNTER — Ambulatory Visit (HOSPITAL_BASED_OUTPATIENT_CLINIC_OR_DEPARTMENT_OTHER): Payer: Medicare HMO | Admitting: Anesthesiology

## 2022-09-04 ENCOUNTER — Encounter (HOSPITAL_COMMUNITY)
Admission: RE | Disposition: A | Discharge status: Home / Self Care | Payer: Self-pay | Source: Home / Self Care | Attending: Cardiovascular Disease

## 2022-09-04 DIAGNOSIS — N186 End stage renal disease: Secondary | ICD-10-CM | POA: Diagnosis not present

## 2022-09-04 DIAGNOSIS — I132 Hypertensive heart and chronic kidney disease with heart failure and with stage 5 chronic kidney disease, or end stage renal disease: Secondary | ICD-10-CM | POA: Diagnosis not present

## 2022-09-04 DIAGNOSIS — I252 Old myocardial infarction: Secondary | ICD-10-CM

## 2022-09-04 DIAGNOSIS — I509 Heart failure, unspecified: Secondary | ICD-10-CM | POA: Diagnosis not present

## 2022-09-04 DIAGNOSIS — E1122 Type 2 diabetes mellitus with diabetic chronic kidney disease: Secondary | ICD-10-CM | POA: Diagnosis not present

## 2022-09-04 DIAGNOSIS — I43 Cardiomyopathy in diseases classified elsewhere: Secondary | ICD-10-CM | POA: Insufficient documentation

## 2022-09-04 DIAGNOSIS — G8918 Other acute postprocedural pain: Secondary | ICD-10-CM | POA: Diagnosis not present

## 2022-09-04 DIAGNOSIS — E854 Organ-limited amyloidosis: Secondary | ICD-10-CM | POA: Insufficient documentation

## 2022-09-04 DIAGNOSIS — I429 Cardiomyopathy, unspecified: Secondary | ICD-10-CM

## 2022-09-04 DIAGNOSIS — I428 Other cardiomyopathies: Secondary | ICD-10-CM | POA: Diagnosis not present

## 2022-09-04 DIAGNOSIS — D6869 Other thrombophilia: Secondary | ICD-10-CM | POA: Insufficient documentation

## 2022-09-04 DIAGNOSIS — D631 Anemia in chronic kidney disease: Secondary | ICD-10-CM

## 2022-09-04 DIAGNOSIS — I4891 Unspecified atrial fibrillation: Secondary | ICD-10-CM | POA: Insufficient documentation

## 2022-09-04 DIAGNOSIS — I5042 Chronic combined systolic (congestive) and diastolic (congestive) heart failure: Secondary | ICD-10-CM | POA: Diagnosis not present

## 2022-09-04 DIAGNOSIS — I5022 Chronic systolic (congestive) heart failure: Secondary | ICD-10-CM | POA: Diagnosis not present

## 2022-09-04 DIAGNOSIS — Z87891 Personal history of nicotine dependence: Secondary | ICD-10-CM | POA: Insufficient documentation

## 2022-09-04 DIAGNOSIS — I484 Atypical atrial flutter: Secondary | ICD-10-CM | POA: Diagnosis not present

## 2022-09-04 DIAGNOSIS — I2510 Atherosclerotic heart disease of native coronary artery without angina pectoris: Secondary | ICD-10-CM

## 2022-09-04 DIAGNOSIS — Z7901 Long term (current) use of anticoagulants: Secondary | ICD-10-CM | POA: Diagnosis not present

## 2022-09-04 DIAGNOSIS — R918 Other nonspecific abnormal finding of lung field: Secondary | ICD-10-CM | POA: Diagnosis not present

## 2022-09-04 HISTORY — PX: SUBQ ICD IMPLANT: EP1223

## 2022-09-04 LAB — GLUCOSE, CAPILLARY: Glucose-Capillary: 106 mg/dL — ABNORMAL HIGH (ref 70–99)

## 2022-09-04 SURGERY — SUBQ ICD IMPLANT
Anesthesia: General

## 2022-09-04 MED ORDER — BUPIVACAINE LIPOSOME 1.3 % IJ SUSP
INTRAMUSCULAR | Status: DC | PRN
  Administered 2022-09-04: 10 mL via PERINEURAL

## 2022-09-04 MED ORDER — FENTANYL CITRATE (PF) 250 MCG/5ML IJ SOLN
INTRAMUSCULAR | Status: DC | PRN
  Administered 2022-09-04: 50 ug via INTRAVENOUS

## 2022-09-04 MED ORDER — DEXAMETHASONE SODIUM PHOSPHATE 10 MG/ML IJ SOLN
INTRAMUSCULAR | Status: DC | PRN
  Administered 2022-09-04: 10 mg via INTRAVENOUS

## 2022-09-04 MED ORDER — POVIDONE-IODINE 10 % EX SWAB
2.0000 | Freq: Once | CUTANEOUS | Status: AC
  Administered 2022-09-04: 2 via TOPICAL

## 2022-09-04 MED ORDER — BUPIVACAINE HCL (PF) 0.25 % IJ SOLN
INTRAMUSCULAR | Status: DC | PRN
  Administered 2022-09-04: 20 mL via PERINEURAL

## 2022-09-04 MED ORDER — BUPIVACAINE HCL (PF) 0.25 % IJ SOLN
INTRAMUSCULAR | Status: DC | PRN
  Administered 2022-09-04: 60 mL

## 2022-09-04 MED ORDER — LIDOCAINE 2% (20 MG/ML) 5 ML SYRINGE
INTRAMUSCULAR | Status: DC | PRN
  Administered 2022-09-04: 60 mg via INTRAVENOUS

## 2022-09-04 MED ORDER — CEFAZOLIN SODIUM-DEXTROSE 2-4 GM/100ML-% IV SOLN
2.0000 g | INTRAVENOUS | Status: AC
  Administered 2022-09-04: 2 g via INTRAVENOUS

## 2022-09-04 MED ORDER — SUGAMMADEX SODIUM 200 MG/2ML IV SOLN
INTRAVENOUS | Status: DC | PRN
  Administered 2022-09-04: 180 mg via INTRAVENOUS

## 2022-09-04 MED ORDER — SODIUM CHLORIDE 0.9 % IV SOLN: INTRAVENOUS | Status: DC

## 2022-09-04 MED ORDER — PHENYLEPHRINE 80 MCG/ML (10ML) SYRINGE FOR IV PUSH (FOR BLOOD PRESSURE SUPPORT)
PREFILLED_SYRINGE | INTRAVENOUS | Status: DC | PRN
  Administered 2022-09-04 (×2): 40 ug via INTRAVENOUS

## 2022-09-04 MED ORDER — ONDANSETRON HCL 4 MG/2ML IJ SOLN
INTRAMUSCULAR | Status: DC | PRN
  Administered 2022-09-04: 4 mg via INTRAVENOUS

## 2022-09-04 MED ORDER — ACETAMINOPHEN 325 MG PO TABS: 325.0000 mg | ORAL_TABLET | ORAL | Status: DC | PRN

## 2022-09-04 MED ORDER — SODIUM CHLORIDE 0.9 % IV SOLN
80.0000 mg | INTRAVENOUS | Status: AC
  Administered 2022-09-04: 80 mg

## 2022-09-04 MED ORDER — BUPIVACAINE HCL (PF) 0.25 % IJ SOLN
INTRAMUSCULAR | Status: AC
  Filled 2022-09-04: qty 60

## 2022-09-04 MED ORDER — ONDANSETRON HCL 4 MG/2ML IJ SOLN: 4.0000 mg | Freq: Four times a day (QID) | INTRAMUSCULAR | Status: DC | PRN

## 2022-09-04 MED ORDER — HEPARIN (PORCINE) IN NACL 1000-0.9 UT/500ML-% IV SOLN
INTRAVENOUS | Status: DC | PRN
  Administered 2022-09-04: 500 mL

## 2022-09-04 MED ORDER — SODIUM CHLORIDE 0.9 % IV SOLN
INTRAVENOUS | Status: AC
  Filled 2022-09-04: qty 2

## 2022-09-04 MED ORDER — PHENYLEPHRINE HCL-NACL 20-0.9 MG/250ML-% IV SOLN
INTRAVENOUS | Status: DC | PRN
  Administered 2022-09-04: 40 ug/min via INTRAVENOUS
  Administered 2022-09-04: 20 ug/min via INTRAVENOUS

## 2022-09-04 MED ORDER — LIDOCAINE HCL 1 % IJ SOLN
INTRAMUSCULAR | Status: AC
  Filled 2022-09-04: qty 60

## 2022-09-04 MED ORDER — PROPOFOL 10 MG/ML IV BOLUS
INTRAVENOUS | Status: DC | PRN
  Administered 2022-09-04: 80 mg via INTRAVENOUS

## 2022-09-04 MED ORDER — FENTANYL CITRATE (PF) 100 MCG/2ML IJ SOLN
INTRAMUSCULAR | Status: AC
  Filled 2022-09-04: qty 2

## 2022-09-04 MED ORDER — FENTANYL CITRATE (PF) 100 MCG/2ML IJ SOLN
INTRAMUSCULAR | Status: AC
  Administered 2022-09-04: 100 ug
  Filled 2022-09-04: qty 2

## 2022-09-04 MED ORDER — EPHEDRINE SULFATE-NACL 50-0.9 MG/10ML-% IV SOSY
PREFILLED_SYRINGE | INTRAVENOUS | Status: DC | PRN
  Administered 2022-09-04: 2.5 mg via INTRAVENOUS

## 2022-09-04 MED ORDER — ACETAMINOPHEN 500 MG PO TABS
1000.0000 mg | ORAL_TABLET | Freq: Once | ORAL | Status: AC
  Administered 2022-09-04: 1000 mg via ORAL
  Filled 2022-09-04: qty 2

## 2022-09-04 MED ORDER — CEFAZOLIN SODIUM-DEXTROSE 2-4 GM/100ML-% IV SOLN
INTRAVENOUS | Status: AC
  Filled 2022-09-04: qty 100

## 2022-09-04 MED ORDER — ROCURONIUM BROMIDE 10 MG/ML (PF) SYRINGE
PREFILLED_SYRINGE | INTRAVENOUS | Status: DC | PRN
  Administered 2022-09-04: 60 mg via INTRAVENOUS
  Administered 2022-09-04: 20 mg via INTRAVENOUS

## 2022-09-04 SURGICAL SUPPLY — 5 items
CABLE SURGICAL S-101-97-12 (CABLE) ×2 IMPLANT
ICD SUBCU MRI EMBLEM A219 (ICD Generator) IMPLANT
LEAD SUBQU EMBLEM 3501 (Pacemaker) IMPLANT
PAD DEFIB RADIO PHYSIO CONN (PAD) ×2 IMPLANT
TRAY PACEMAKER INSERTION (PACKS) ×2 IMPLANT

## 2022-09-04 NOTE — Anesthesia Procedure Notes (Signed)
Anesthesia Regional Block: Pectoralis block   Pre-Anesthetic Checklist: , timeout performed,  Correct Patient, Correct Site, Correct Laterality,  Correct Procedure, Correct Position, site marked,  Risks and benefits discussed,  Surgical consent,  Pre-op evaluation,  At surgeon's request and post-op pain management  Laterality: Left  Prep: chloraprep       Needles:   Needle Type: Stimiplex     Needle Length: 9cm      Additional Needles:   Procedures:,,,, ultrasound used (permanent image in chart),,    Narrative:  Start time: 09/04/2022 1:13 PM End time: 09/04/2022 1:33 PM Injection made incrementally with aspirations every 5 mL.  Performed by: Personally  Anesthesiologist: Lewie Loron, MD  Additional Notes: BP cuff, SpO2 and EKG monitors applied. Sedation begun.  Anesthetic injected incrementally, slowly, and after neg aspirations under direct ultrasound guidance. Good fascial spread noted. Patient tolerated well.

## 2022-09-04 NOTE — Progress Notes (Signed)
Dr Nelly Laurence in and ok to d/c home

## 2022-09-04 NOTE — Anesthesia Postprocedure Evaluation (Signed)
Anesthesia Post Note  Patient: Corey [name]  Procedure(s) Performed: SUBQ ICD IMPLANT     Patient location during evaluation: PACU Anesthesia Type: General Level of consciousness: awake and alert Pain management: pain level controlled Vital Signs Assessment: post-procedure vital signs reviewed and stable Respiratory status: spontaneous breathing, nonlabored ventilation, respiratory function stable and patient connected to nasal cannula oxygen Cardiovascular status: blood pressure returned to baseline and stable Postop Assessment: no apparent nausea or vomiting Anesthetic complications: no  There were no known notable events for this encounter.  Last Vitals:  Vitals:   09/04/22 1800 09/04/22 1830  BP: (!) 144/83 (!) 143/99  Pulse:  63  Resp: (!) 21 (!) 24  Temp:    SpO2:  100%    Last Pain:  Vitals:   09/04/22 1622  TempSrc: Temporal  PainSc:                  Trevor Iha

## 2022-09-04 NOTE — H&P (Signed)
Electrophysiology Office Note:    Date:  09/04/2022   ID:  Corey [name], Corey [name] 1946/04/05, MRN [id number]  PCP:  Pricilla Riffle, MD   [facility] Providers Cardiologist:  Donato Schultz, MD Electrophysiologist:  Maurice Small, MD     Referring MD: Maurice Small, MD   History of Present Illness:    Corey [name] is a 77 y.o. male with a hx detailed below, significant for ATTR cardiac amyloid, pericardial effusion status post window, CHFrEF, CAD ESRD approaching dialysis, and atrial fibrillation and flutter referred for arrhythmia management.  In 2022, [name] presented to [facility] with DOE/angina. LHC at that time demonstrated 80% occlusion of the LAD, however, PCI was deferred due to markedly elevated LVEDP. LVEF by echo at that time of 35%-40%. Due to a large pericardial effusion, he had a window done with biopsies at that time compatible with amyloidosis. AL evaluation was negative. PYP was equivocal. Genetic testing consistent with Val142Ile TTR.   He was admitted in early November 2023 with volume overload and an atrial fibrillation occasionally organizing into flutter. He was diuresed, underwent TEE/DCCV x 2. When in sinus rhythm, he was often bradycardic with sinus brady and Mobitz I AV block. I saw him a few times during this admission and recommended placement of a holter monitor on discharge to assess chronotropic competence.  He underwent EP study and ablation for atrial flutter in March, 2024. He had multiple arrhythmias -- multiple flutters involving variable circuits as well as a transient focal tachycardia. I performed a CTI ablation as at least one of the flutters involved the cavo-tricuspid isthmus. I elected not to pursue all the multiple flutter circuits. Today he has an atypical atrial flutter. He continues to have shortness of breath and fatigue but no palpitations.  Past Medical History:  Diagnosis Date   Cataract    CHF (congestive heart failure)     Chronic heart failure with preserved ejection fraction (HFpEF)    Colon polyp 2003   Dr Victorino Dike; F/U was to be 2008( not completed)   Coronary artery disease    CVA (cerebral infarction) 2014   Diabetes mellitus    Diverticulosis 2003   Dyspnea    with exertion   Gout    Hereditary cardiac amyloidosis    Hypertension    Myocardial infarction 09/19/2012   PEA cardiac arrest in setting of acute respiratory failure/pulmonary edema   OSA (obstructive sleep apnea) 03/27/2018   Pneumonia 09/29/2011   Avelox X 10 days as OP   Prostate cancer    Renal insufficiency    Seizures 09/19/2012   not treated for seizure disorder; had a seizure after stroke 2014; no seizure since then   Stroke     Past Surgical History:  Procedure Laterality Date   A-FLUTTER ABLATION N/A 06/23/2022   Procedure: A-FLUTTER ABLATION;  Surgeon: Maurice Small, MD;  Location: [facility] INVASIVE CV LAB;  Service: Cardiovascular;  Laterality: N/A;   A/V FISTULAGRAM Left 04/28/2022   Procedure: A/V Fistulagram;  Surgeon: Chuck Hint, MD;  Location: [facility] INVASIVE CV LAB;  Service: Cardiovascular;  Laterality: Left;   AV FISTULA PLACEMENT Left 01/31/2022   Procedure: LEFT ARM ARTERIOVENOUS (AV) FISTULA CREATION;  Surgeon: Maeola Harman, MD;  Location: [facility] OR;  Service: Vascular;  Laterality: Left;  regional block to left arm   CARDIOVERSION N/A 03/24/2022   Procedure: CARDIOVERSION;  Surgeon: Dorthula Nettles, DO;  Location: [facility] ENDOSCOPY;  Service: Cardiovascular;  Laterality:  N/A;   COLONOSCOPY W/ POLYPECTOMY  2003   no F/U (SOC discussed 12/03/12)   INGUINAL HERNIA REPAIR Left 06/16/2021   Procedure: LAPAROSCOPIC, POSSIBLY OPEN LEFT INGUINAL HERNIA REPAIR WITH MESH;  Surgeon: Quentin Ore, MD;  Location: [facility] ORS;  Service: General;  Laterality: Left;   INSERTION OF MESH N/A 07/16/2017   Procedure: INSERTION OF MESH;  Surgeon: Berna Bue, MD;  Location: [facility] OR;  Service: General;   Laterality: N/A;   PEG PLACEMENT  2014   10/08/12-12/10/12   PEG TUBE REMOVAL  2014   PERIPHERAL VASCULAR BALLOON ANGIOPLASTY  04/28/2022   Procedure: PERIPHERAL VASCULAR BALLOON ANGIOPLASTY;  Surgeon: Chuck Hint, MD;  Location: [facility] INVASIVE CV LAB;  Service: Cardiovascular;;   PROSTATE BIOPSY     RIGHT HEART CATH N/A 03/24/2022   Procedure: RIGHT HEART CATH;  Surgeon: Dorthula Nettles, DO;  Location: [facility] INVASIVE CV LAB;  Service: Cardiovascular;  Laterality: N/A;   RIGHT/LEFT HEART CATH AND CORONARY ANGIOGRAPHY N/A 09/20/2020   Procedure: RIGHT/LEFT HEART CATH AND CORONARY ANGIOGRAPHY;  Surgeon: Marykay Lex, MD;  Location: [facility] INVASIVE CV LAB;  Service: Cardiovascular;  Laterality: N/A;   TEE WITHOUT CARDIOVERSION N/A 09/27/2020   Procedure: TRANSESOPHAGEAL ECHOCARDIOGRAM (TEE);  Surgeon: Linden Dolin, MD;  Location: [facility] OR;  Service: Thoracic;  Laterality: N/A;   TEE WITHOUT CARDIOVERSION N/A 03/24/2022   Procedure: TRANSESOPHAGEAL ECHOCARDIOGRAM (TEE);  Surgeon: Dorthula Nettles, DO;  Location: [facility] ENDOSCOPY;  Service: Cardiovascular;  Laterality: N/A;   TEE WITHOUT CARDIOVERSION N/A 06/23/2022   Procedure: TRANSESOPHAGEAL ECHOCARDIOGRAM (TEE);  Surgeon: Maurice Small, MD;  Location[facility] INVASIVE CV LAB;  Service: Cardiovascular;  Laterality: N/A;   TRACHEOSTOMY  2014   09/30/12-10/20/12   VENTRAL HERNIA REPAIR N/A 07/16/2017   Procedure: LAPAROSCOPIC VENTRAL HERNIA REPAIR WITH MESH;  Surgeon: Berna Bue, MD;  Location: [facility] OR;  Service: General;  Laterality: N/A;   wrist aspiration  02/16/2012    monosodium urate crystals; Dr Melvyn Novas    Current Medications: Current Meds  Medication Sig   ACCU-CHEK AVIVA PLUS test strip TEST BLOOD SUGAR TWICE DAILY AS DIRECTED   acetaminophen (TYLENOL) 500 MG tablet Take 1,000 mg by mouth every 6 (six) hours as needed for moderate pain.   albuterol (VENTOLIN HFA) 108 (90 Base) MCG/ACT inhaler Inhale 2 puffs into the lungs every  4 (four) hours as needed for wheezing or shortness of breath.   allopurinol (ZYLOPRIM) 100 MG tablet Take 50 mg by mouth in the morning.   apixaban (ELIQUIS) 5 MG TABS tablet Take 1 tablet (5 mg total) by mouth 2 (two) times daily.   cholecalciferol (VITAMIN D3) 25 MCG (1000 UNIT) tablet Take 1,000 Units by mouth daily.   colchicine (COLCRYS) 0.6 MG tablet Take 2 tabs once and then one tab one hour later as needed for gout   hydrALAZINE (APRESOLINE) 25 MG tablet TAKE 1 TABLET BY MOUTH THREE TIMES DAILY   metolazone (ZAROXOLYN) 5 MG tablet Take 1 tablet (5 mg total) by mouth once a week. Take (1) tablet once a week on the same day of the week   sodium bicarbonate 650 MG tablet Take 1 tablet by mouth twice daily   Tafamidis (VYNDAMAX) 61 MG CAPS Take 1 capsule (61 mg total) by mouth daily.   torsemide (DEMADEX) 20 MG tablet Take 20 mg by mouth daily.   TRUEplus Lancets 33G MISC USE 4 (FOUR) TIMES DAILY   [DISCONTINUED] hydrALAZINE (APRESOLINE) 25 MG tablet Take 1 tablet (25  mg total) by mouth 3 (three) times daily.   [DISCONTINUED] sodium bicarbonate 650 MG tablet Take 1 tablet by mouth twice daily     Allergies:   Hydrochlorothiazide   Social History   Socioeconomic History   Marital status: Married    Spouse name: [name]   Number of children: 2   Years of education: 12   Highest education level: 12th grade  Occupational History   Not on file  Tobacco Use   Smoking status: Former    Packs/day: 0.25    Years: 1.00    Additional pack years: 0.00    Total pack years: 0.25    Types: Cigarettes    Quit date: 05/15/1968    Years since quitting: 54.3   Smokeless tobacco: Never   Tobacco comments:    smoked 1969- 1970, up to 1-2 cigarettes/ day  Vaping Use   Vaping Use: Never used  Substance and Sexual Activity   Alcohol use: No    Alcohol/week: 0.0 standard drinks of alcohol   Drug use: No   Sexual activity: Yes  Other Topics Concern   Not on file  Social History Narrative    Walks three times a week, 2-3 miles, occasional walks on treadmill       Patient is right-handed. He lives with his wife in a one level home. He drinks 1 diet Mt. Dew a day and rarely has coffee. He walks 3 x a week for exercise.   Social Determinants of Health   Financial Resource Strain: Low Risk  (02/02/2022)   Overall Financial Resource Strain (CARDIA)    Difficulty of Paying Living Expenses: Not hard at all  Food Insecurity: No Food Insecurity (02/02/2022)   Hunger Vital Sign    Worried About Running Out of Food in the Last Year: Never true    Ran Out of Food in the Last Year: Never true  Transportation Needs: No Transportation Needs (05/01/2022)   PRAPARE - Administrator, Civil Service (Medical): No    Lack of Transportation (Non-Medical): No  Physical Activity: Inactive (02/02/2022)   Exercise Vital Sign    Days of Exercise per Week: 0 days    Minutes of Exercise per Session: 0 min  Stress: No Stress Concern Present (02/02/2022)   [employer] of Occupational Health - Occupational Stress Questionnaire    Feeling of Stress : Not at all  Social Connections: Socially Integrated (02/02/2022)   Social Connection and Isolation Panel [NHANES]    Frequency of Communication with Friends and Family: More than three times a week    Frequency of Social Gatherings with Friends and Family: More than three times a week    Attends Religious Services: More than 4 times per year    Active Member of [employer] or Organizations: Yes    Attends Engineer, structural: More than 4 times per year    Marital Status: Married     Family History: The patient's family history includes Cancer in his father; Diabetes in his father and mother; Heart failure in his brother and mother; Hypertension in his father and mother; Prostate cancer in his father. There is no history of Stroke, Heart disease, or Colon cancer.  ROS:   Please see the history of present illness.    All other systems  reviewed and are negative.  EKGs/Labs/Other Studies Reviewed Today:    TTE 03/23/2022 Speckled appearance consistent with amyloid Global hypokinesis, EF 30-35%, severe LVH LA and RA are moderately dilated  EKG:  Last EKG results: today - atrial flutter, atypical   Recent Labs: 01/05/2022: ALT 31; Pro B Natriuretic peptide (BNP) 1,740.0 03/22/2022: B Natriuretic Peptide 1,571.3 08/21/2022: BUN 65; Creatinine, Ser 2.97; Hemoglobin 11.3; Platelets 174; Potassium 4.4; Sodium 143     Physical Exam:    VS:  BP (!) 153/90 (BP Location: Right Arm)   Pulse (!) 50   Temp 97.6 F (36.4 C) (Temporal)   Resp 18   Ht 5\' 6"  (1.676 m)   Wt 68.5 kg   SpO2 100%   BMI 24.37 kg/m     Wt Readings from Last 3 Encounters:  09/04/22 68.5 kg  08/21/22 75.9 kg  08/02/22 73 kg     GEN:  Well nourished, well developed in no acute distress CARDIAC: RRR, no murmurs, rubs, gallops RESPIRATORY:  Normal work of breathing MUSCULOSKELETAL: no edema    ASSESSMENT & PLAN:    Atrial flutter and fibrillation: Pt has multiple flutter circuits not amenable to ablation. Secondary hypercoagulable state: continue apixaban. CHFrEF: EF has remained severely depressed at < 35% despite GDMT for over 3 months.  We discussed options for primary prevention of sudden cardiac death. The presence of an A-V fistula limits transvenous access and increases infection risk for transvenous ICD. Given this, I recommended subcutaneous ICD. Risks including infection, bleeding, pain explained to the patient. Risk of death is low but not zero. cardiac amyloid. ESRD with fistula: left-side fistula.        Medication Adjustments/Labs and Tests Ordered: Current medicines are reviewed at length with the patient today.  Concerns regarding medicines are outlined above.  Orders Placed This Encounter  Procedures   Glucose, capillary   Informed Consent Details: Physician/Practitioner Attestation; Transcribe to consent form and  obtain patient signature   Initiate Pre-op Protocol   Apply Cardiac Implantable Device Care Plan   Void on call to EP Lab   Electrode Placement Place arm electrodes on posterior shoulders   Confirm CBC and BMP (or CMP) results within 7 days for inpatient and 30 days for outpatient:   Pre-admission testing diagnosis   See Sidebar Report for procedure prep   EP PPM/ICD IMPLANT   Insert peripheral IV   Meds ordered this encounter  Medications   0.9 %  sodium chloride infusion   gentamicin (GARAMYCIN) 80 mg in sodium chloride 0.9 % 500 mL irrigation   ceFAZolin (ANCEF) IVPB 2g/100 mL premix    Order Specific Question:   Indication:    Answer:   Surgical Prophylaxis   povidone-iodine 10 % swab 2 Application   acetaminophen (TYLENOL) tablet 1,000 mg     Signed, Maurice Small, MD  09/04/2022 11:40 AM    [facility]

## 2022-09-04 NOTE — Discharge Instructions (Signed)
MAY RESUME ELIQUIS THURSDAY 09/07/22 AM DOSE

## 2022-09-04 NOTE — Transfer of Care (Signed)
Immediate Anesthesia Transfer of Care Note  Patient: Corey [name]  Procedure(s) Performed: Leontine Locket ICD IMPLANT  Patient Location: PACU  Anesthesia Type:GA combined with regional for post-op pain  Level of Consciousness: drowsy and patient cooperative  Airway & Oxygen Therapy: Patient Spontanous Breathing and Patient connected to nasal cannula oxygen  Post-op Assessment: Report given to RN and Post -op Vital signs reviewed and stable  Post vital signs: Reviewed and stable  Last Vitals:  Vitals Value Taken Time  BP 140/90 09/04/22 1630  Temp    Pulse 57   Resp 18 09/04/22 1631  SpO2 92   Vitals shown include unvalidated device data.  Last Pain:  Vitals:   09/04/22 1340  TempSrc:   PainSc: 0-No pain         Complications: There were no known notable events for this encounter.

## 2022-09-04 NOTE — Anesthesia Procedure Notes (Addendum)
Arterial Line Insertion Start/End4/22/2024 2:35 PM, 09/04/2022 2:40 PM Performed by: Lewie Loron, MD, anesthesiologist  Patient location: Pre-op. Preanesthetic checklist: patient identified, IV checked, site marked, risks and benefits discussed, surgical consent, monitors and equipment checked, pre-op evaluation, timeout performed and anesthesia consent Lidocaine 1% used for infiltration Right, radial was placed Catheter size: 20 G Hand hygiene performed , maximum sterile barriers used  and Seldinger technique used  Attempts: 1 Procedure performed using ultrasound guided technique. Ultrasound Notes:anatomy identified, needle tip was noted to be adjacent to the nerve/plexus identified, no ultrasound evidence of intravascular and/or intraneural injection and image(s) printed for medical record Following insertion, dressing applied and Biopatch. Post procedure assessment: normal and unchanged  Patient tolerated the procedure well with no immediate complications.

## 2022-09-04 NOTE — Anesthesia Procedure Notes (Signed)
Procedure Name: Intubation Date/Time: 09/04/2022 2:47 PM  Performed by: Carlos American, CRNAPre-anesthesia Checklist: Patient identified, Emergency Drugs available, Suction available and Patient being monitored Patient Re-evaluated:Patient Re-evaluated prior to induction Oxygen Delivery Method: Circle System Utilized Preoxygenation: Pre-oxygenation with 100% oxygen Induction Type: IV induction Ventilation: Mask ventilation without difficulty Laryngoscope Size: Mac and 4 Grade View: Grade I Tube type: Oral Tube size: 7.5 mm Number of attempts: 1 Airway Equipment and Method: Stylet Placement Confirmation: ETT inserted through vocal cords under direct vision, positive ETCO2 and breath sounds checked- equal and bilateral Secured at: 21 cm Tube secured with: Tape Dental Injury: Teeth and Oropharynx as per pre-operative assessment

## 2022-09-05 ENCOUNTER — Encounter (HOSPITAL_COMMUNITY): Payer: Self-pay | Admitting: Cardiovascular Disease

## 2022-09-11 ENCOUNTER — Other Ambulatory Visit (HOSPITAL_COMMUNITY): Payer: Self-pay

## 2022-09-11 ENCOUNTER — Other Ambulatory Visit: Payer: Self-pay

## 2022-09-14 ENCOUNTER — Ambulatory Visit: Payer: Medicare HMO | Attending: Cardiovascular Disease

## 2022-09-14 DIAGNOSIS — I255 Ischemic cardiomyopathy: Secondary | ICD-10-CM | POA: Diagnosis not present

## 2022-09-14 LAB — CUP PACEART INCLINIC DEVICE CHECK
Date Time Interrogation Session: 20240502141125
Implantable Lead Connection Status: 753985
Implantable Lead Implant Date: 20240422
Implantable Lead Location: 753860
Implantable Lead Model: 3501
Implantable Lead Serial Number: 255072
Implantable Pulse Generator Implant Date: 20240422
Pulse Gen Serial Number: 303979

## 2022-09-14 NOTE — Patient Instructions (Signed)
? ?  After Your ICD ?(Implantable Cardiac Defibrillator) ? ? ? ?Monitor your defibrillator site for redness, swelling, and drainage. Call the device clinic at [phone] if you experience these symptoms or fever/chills. ? ?Your incision was closed with Steri-strips or staples:  You may shower 7 days after your procedure and wash your incision with soap and water. Avoid lotions, ointments, or perfumes over your incision until it is well-healed. ? ?You may use a hot tub or a pool after your wound check appointment if the incision is completely closed. ? ?Do not lift, push or pull greater than 10 pounds with the affected arm until 6 weeks after your procedure. There are no other restrictions in arm movement after your wound check appointment. ? ?Your ICD is designed to protect you from life threatening heart rhythms. Because of this, you may receive a shock.  ? ?1 shock with no symptoms:  Call the office during business hours. ?1 shock with symptoms (chest pain, chest pressure, dizziness, lightheadedness, shortness of breath, overall feeling unwell):  Call 911. ?If you experience 2 or more shocks in 24 hours:  Call 911. ?If you receive a shock, you should not drive.  ?[place] DMV - no driving for 6 months if you receive appropriate therapy from your ICD.  ? ?ICD Alerts:  Some alerts are vibratory and others beep. These are NOT emergencies. Please call our office to let us know. If this occurs at night or on weekends, it can wait until the next business day. Send a remote transmission. ? ?If your device is capable of reading fluid status (for heart failure), you will be offered monthly monitoring to review this with you.  ? ?Remote monitoring is used to monitor your ICD from home. This monitoring is scheduled every 91 days by our office. It allows us to keep an eye on the functioning of your device to ensure it is working properly. You will routinely see your Electrophysiologist annually (more often if necessary).  ?

## 2022-09-14 NOTE — Progress Notes (Signed)
Wound check appointment. Steri-strips removed. Wound without redness or edema. Incision edges approximated, wound well healed. 0 untreated episodes; 0 treated episodes; 0 shocks delivered. Electrode impedance status 35 ohms. No programming changes. Remaining longevity to ERI 100%. Patient educated about wound care, arm mobility, lifting restrictions.  ROV in 3 months with implanting physician.  Error in saving report. See scanned media for details.

## 2022-09-15 ENCOUNTER — Other Ambulatory Visit (HOSPITAL_COMMUNITY): Payer: Self-pay

## 2022-09-18 ENCOUNTER — Other Ambulatory Visit (HOSPITAL_COMMUNITY): Payer: Self-pay

## 2022-09-18 ENCOUNTER — Telehealth: Payer: Self-pay | Admitting: Cardiovascular Disease

## 2022-09-18 NOTE — Telephone Encounter (Signed)
I spoke with the patient wife and let her know the monitor is working properly.

## 2022-09-18 NOTE — Telephone Encounter (Signed)
Pt's wife would like a callback regarding pt's monitor flashing yellow. Please advise

## 2022-09-19 ENCOUNTER — Other Ambulatory Visit (INDEPENDENT_AMBULATORY_CARE_PROVIDER_SITE_OTHER): Payer: Medicare HMO

## 2022-09-19 DIAGNOSIS — E1165 Type 2 diabetes mellitus with hyperglycemia: Secondary | ICD-10-CM | POA: Diagnosis not present

## 2022-09-19 DIAGNOSIS — E119 Type 2 diabetes mellitus without complications: Secondary | ICD-10-CM | POA: Diagnosis not present

## 2022-09-19 LAB — HEMOGLOBIN A1C: Hgb A1c MFr Bld: 7 % — ABNORMAL HIGH (ref 4.6–6.5)

## 2022-09-19 LAB — GLUCOSE, RANDOM: Glucose, Bld: 110 mg/dL — ABNORMAL HIGH (ref 70–99)

## 2022-09-20 LAB — FRUCTOSAMINE: Fructosamine: 404 umol/L — ABNORMAL HIGH (ref 0–285)

## 2022-09-21 ENCOUNTER — Other Ambulatory Visit: Payer: Medicare HMO

## 2022-09-21 VITALS — BP 110/70 | HR 46 | Temp 97.2°F

## 2022-09-21 DIAGNOSIS — Z515 Encounter for palliative care: Secondary | ICD-10-CM

## 2022-09-21 NOTE — Progress Notes (Signed)
PATIENT NAME: Corey [name] DOB: 04/16/1946 MRN: [id number]  PRIMARY CARE PROVIDER: Pricilla Riffle, MD  RESPONSIBLE PARTY:  Acct ID - Guarantor Home Phone Work Phone Relationship Acct Type  [id number] [name], [name] [phone]  Self P/F     [address] [place], [place] [address]   Palliative Care Follow Up Encounter Note    Completed home visit. Wife Dedra Skeens also present.   Cognitive: wife reports pt will begin to cook, go do something else and forget that he was cooking; she is concerned he will start a fire; LPN, SW and wife discussed options to discourage cooking when she is not at home  Cardiac: had a defibrillator placed since last visit; no reports of pain, infection but it aches when he touches the spot where it was implanted   Appetite: still eats 1 reduced meal at breakfast and 1 full meal at dinner and snacks throughout the day; drinks fluids throughout the day; restricted to 56oz daily; pt reports he drinks more than he should because he's thirsty; LPN encouraged pt to suck on ice to satisfy thirst instead of drinking another 8 oz of fluid; pt acknowledged suggestion but reports he chews the ice and continues to drink additional fluids   Mobility: walks without assist but doesn't walk as much as he used to due to SOB   GI/GU: has a BM every few days; educated pt on the importance of not straining when trying to have a BM; nurse encouraged pt to take his stool softener daily; denies urinary issues   Respiratory: gets SOB when ambulating; reports he will sit down and rest if he feels the need   Sleeping Pattern: nods off during the day and doesn't sleep well at night because it's hard to go back to sleep and it's hard to get back to sleep again   Pain: denies pain at this time; pt takes Tylenol when he has back pain        CODE STATUS: Full Code  ADVANCED DIRECTIVES: N MOST FORM: N PPS: 60%     PHYSICAL EXAM:   VITALS: Today's Vitals   09/21/22 1132  BP: 110/70   Pulse: (!) 46  Temp: (!) 97.2 F (36.2 C)  TempSrc: Temporal  SpO2: 99%  PainSc: 0-No pain    LUNGS: clear to auscultation  CARDIAC: Cor RRR; bradycardia EXTREMITIES: no edema noted this visit SKIN: cool, dry and intact; AROM x4  NEURO: negative except for weakness   Next scheduled visit: 11/10/22 @ 12pm    Lenee Franze Clementeen Graham, LPN

## 2022-09-22 ENCOUNTER — Ambulatory Visit: Payer: Medicare HMO | Admitting: Endocrinology

## 2022-09-27 ENCOUNTER — Encounter: Payer: Self-pay | Admitting: Cardiology

## 2022-09-27 ENCOUNTER — Other Ambulatory Visit (HOSPITAL_COMMUNITY): Payer: Self-pay

## 2022-09-27 ENCOUNTER — Ambulatory Visit: Payer: Medicare HMO | Attending: Cardiology | Admitting: Cardiology

## 2022-09-27 VITALS — BP 140/78 | HR 45 | Ht 66.0 in | Wt 163.0 lb

## 2022-09-27 DIAGNOSIS — I255 Ischemic cardiomyopathy: Secondary | ICD-10-CM

## 2022-09-27 DIAGNOSIS — I4892 Unspecified atrial flutter: Secondary | ICD-10-CM

## 2022-09-27 DIAGNOSIS — I480 Paroxysmal atrial fibrillation: Secondary | ICD-10-CM

## 2022-09-27 DIAGNOSIS — N185 Chronic kidney disease, stage 5: Secondary | ICD-10-CM | POA: Diagnosis not present

## 2022-09-27 MED ORDER — TORSEMIDE 40 MG PO TABS
1.0000 | ORAL_TABLET | Freq: Every day | ORAL | 3 refills | Status: DC
  Filled 2022-09-27: qty 90, 90d supply, fill #0

## 2022-09-27 MED ORDER — APIXABAN 5 MG PO TABS
5.0000 mg | ORAL_TABLET | Freq: Two times a day (BID) | ORAL | 6 refills | Status: DC
  Filled 2022-09-27 – 2022-11-06 (×3): qty 60, 30d supply, fill #0

## 2022-09-27 MED ORDER — METOLAZONE 5 MG PO TABS
5.0000 mg | ORAL_TABLET | ORAL | 3 refills | Status: DC
  Filled 2022-09-27 – 2022-11-06 (×2): qty 15, 105d supply, fill #0

## 2022-09-27 NOTE — Patient Instructions (Signed)
Medication Instructions:  The current medical regimen is effective;  continue present plan and medications.  *If you need a refill on your cardiac medications before your next appointment, please call your pharmacy*  Follow-Up: At [facility], you and your health needs are our priority.  As part of our continuing mission to provide you with exceptional heart care, we have created designated Provider Care Teams.  These Care Teams include your primary Cardiologist (physician) and Advanced Practice Providers (APPs -  Physician Assistants and Nurse Practitioners) who all work together to provide you with the care you need, when you need it.  We recommend signing up for the patient portal called "MyChart".  Sign up information is provided on this After Visit Summary.  MyChart is used to connect with patients for Virtual Visits (Telemedicine).  Patients are able to view lab/test results, encounter notes, upcoming appointments, etc.  Non-urgent messages can be sent to your provider as well.   To learn more about what you can do with MyChart, go to [email or web address].    Your next appointment:   6 month(s)  Provider:   Mark Skains, MD     

## 2022-09-27 NOTE — Progress Notes (Signed)
Cardiology Office Note:    Date:  09/27/2022   ID:  Corey [name], DOB [birth year], MRN [id number]  PCP:  Pincus Sanes, MD   [facility] Providers Cardiologist:  Donato Schultz, MD Electrophysiologist:  Maurice Small, MD     Referring MD: Pincus Sanes, MD    History of Present Illness:    Corey [name] is a 77 y.o. male with cardiac amyloidosis seen by Dr. Nelly Laurence post subcutaneous ICD implant.  Has had palliative care encounter as well.  Appreciate their service.  ATTR cardiac amyloidosis with prior pericardial effusion status post window with chronic reduced heart failure coronary disease end-stage renal disease approaching dialysis with atrial fibrillation and flutter.  Back in 2022 he had left heart catheterization that demonstrated an 80% stenosis of LAD PCI was deferred due to markedly elevated LVEDP at that time with EF of 35%.  He also had a large pericardial effusion and a window was done with biopsies that were compatible with amyloidosis.  AL evaluation was negative.  PYP was equivocal.  Genetic testing was consistent with ATTR  Back in November 2023 was admitted in November with volume overload, atrial fibrillation, flutter.  Diuresed.  Monitor TEE cardioversion x 2.  In sinus rhythm he was bradycardic.  Mobitz type I AV block was noted.  Chronotropic competence was possible.  He wonders whether an EP study and ablation in March or 2024 with multiple arrhythmias multiple flutters variable circuits noted with transient focal tachycardia.  A CTI ablation was performed by Dr. Nelly Laurence and at least one of the flutters was cavotricuspid isthmus related.  He was unable to proceed with all of the multiple cardiac circuits.  In follow-up in clinic on 08/21/2022 he had a atypical atrial flutter.  Continues to have shortness of breath.  Past Medical History:  Diagnosis Date   Cataract    CHF (congestive heart failure) (HCC)    Chronic heart failure with preserved ejection  fraction (HFpEF) (HCC)    Colon polyp 2003   Dr Victorino Dike; F/U was to be 2008( not completed)   Coronary artery disease    CVA (cerebral infarction) 2014   Diabetes mellitus    Diverticulosis 2003   Dyspnea    with exertion   Gout    Hereditary cardiac amyloidosis (HCC)    Hypertension    Myocardial infarction (HCC) 09/19/2012   PEA cardiac arrest in setting of acute respiratory failure/pulmonary edema   OSA (obstructive sleep apnea) 03/27/2018   Pneumonia 09/29/2011   Avelox X 10 days as OP   Prostate cancer (HCC)    Renal insufficiency    Seizures (HCC) 09/19/2012   not treated for seizure disorder; had a seizure after stroke 2014; no seizure since then   Stroke [facility])     Past Surgical History:  Procedure Laterality Date   A-FLUTTER ABLATION N/A 06/23/2022   Procedure: A-FLUTTER ABLATION;  Surgeon: Maurice Small, MD;  Location: [facility] INVASIVE CV LAB;  Service: Cardiovascular;  Laterality: N/A;   A/V FISTULAGRAM Left 04/28/2022   Procedure: A/V Fistulagram;  Surgeon: Chuck Hint, MD;  Location: [facility] INVASIVE CV LAB;  Service: Cardiovascular;  Laterality: Left;   AV FISTULA PLACEMENT Left 01/31/2022   Procedure: LEFT ARM ARTERIOVENOUS (AV) FISTULA CREATION;  Surgeon: Maeola Harman, MD;  Location: [facility] OR;  Service: Vascular;  Laterality: Left;  regional block to left arm   CARDIOVERSION N/A 03/24/2022   Procedure: CARDIOVERSION;  Surgeon: Dorthula Nettles, DO;  Location: [facility] ENDOSCOPY;  Service: Cardiovascular;  Laterality: N/A;   COLONOSCOPY W/ POLYPECTOMY  2003   no F/U (SOC discussed 12/03/12)   INGUINAL HERNIA REPAIR Left 06/16/2021   Procedure: LAPAROSCOPIC, POSSIBLY OPEN LEFT INGUINAL HERNIA REPAIR WITH MESH;  Surgeon: Quentin Ore, MD;  Location: [facility] ORS;  Service: General;  Laterality: Left;   INSERTION OF MESH N/A 07/16/2017   Procedure: INSERTION OF MESH;  Surgeon: Berna Bue, MD;  Location: [facility] OR;  Service: General;  Laterality: N/A;    PEG PLACEMENT  2014   10/08/12-12/10/12   PEG TUBE REMOVAL  2014   PERIPHERAL VASCULAR BALLOON ANGIOPLASTY  04/28/2022   Procedure: PERIPHERAL VASCULAR BALLOON ANGIOPLASTY;  Surgeon: Chuck Hint, MD;  Location: [facility] INVASIVE CV LAB;  Service: Cardiovascular;;   PROSTATE BIOPSY     RIGHT HEART CATH N/A 03/24/2022   Procedure: RIGHT HEART CATH;  Surgeon: Dorthula Nettles, DO;  Location: [facility] INVASIVE CV LAB;  Service: Cardiovascular;  Laterality: N/A;   RIGHT/LEFT HEART CATH AND CORONARY ANGIOGRAPHY N/A 09/20/2020   Procedure: RIGHT/LEFT HEART CATH AND CORONARY ANGIOGRAPHY;  Surgeon: Marykay Lex, MD;  Location: [facility] INVASIVE CV LAB;  Service: Cardiovascular;  Laterality: N/A;   SUBQ ICD IMPLANT N/A 09/04/2022   Procedure: SUBQ ICD IMPLANT;  Surgeon: Maurice Small, MD;  Location: [facility] INVASIVE CV LAB;  Service: Cardiovascular;  Laterality: N/A;   TEE WITHOUT CARDIOVERSION N/A 09/27/2020   Procedure: TRANSESOPHAGEAL ECHOCARDIOGRAM (TEE);  Surgeon: Linden Dolin, MD;  Location: [facility] OR;  Service: Thoracic;  Laterality: N/A;   TEE WITHOUT CARDIOVERSION N/A 03/24/2022   Procedure: TRANSESOPHAGEAL ECHOCARDIOGRAM (TEE);  Surgeon: Dorthula Nettles, DO;  Location: [facility] ENDOSCOPY;  Service: Cardiovascular;  Laterality: N/A;   TEE WITHOUT CARDIOVERSION N/A 06/23/2022   Procedure: TRANSESOPHAGEAL ECHOCARDIOGRAM (TEE);  Surgeon: Maurice Small, MD;  Location: [facility] INVASIVE CV LAB;  Service: Cardiovascular;  Laterality: N/A;   TRACHEOSTOMY  2014   09/30/12-10/20/12   VENTRAL HERNIA REPAIR N/A 07/16/2017   Procedure: LAPAROSCOPIC VENTRAL HERNIA REPAIR WITH MESH;  Surgeon: Berna Bue, MD;  Location: [facility] OR;  Service: General;  Laterality: N/A;   wrist aspiration  02/16/2012    monosodium urate crystals; Dr Melvyn Novas    Current Medications: Current Meds  Medication Sig   ACCU-CHEK AVIVA PLUS test strip TEST BLOOD SUGAR TWICE DAILY AS DIRECTED   acetaminophen (TYLENOL) 500 MG tablet Take  1,000 mg by mouth every 6 (six) hours as needed for moderate pain.   albuterol (VENTOLIN HFA) 108 (90 Base) MCG/ACT inhaler Inhale 2 puffs into the lungs every 4 (four) hours as needed for wheezing or shortness of breath.   allopurinol (ZYLOPRIM) 100 MG tablet Take 50 mg by mouth in the morning.   apixaban (ELIQUIS) 5 MG TABS tablet Take 1 tablet (5 mg total) by mouth 2 (two) times daily.   bismuth subsalicylate (PEPTO BISMOL) 262 MG/15ML suspension Take 30 mLs by mouth every 6 (six) hours as needed for indigestion.   colchicine (COLCRYS) 0.6 MG tablet Take 2 tabs once and then one tab one hour later as needed for gout   hydrALAZINE (APRESOLINE) 25 MG tablet TAKE 1 TABLET BY MOUTH THREE TIMES DAILY   metolazone (ZAROXOLYN) 5 MG tablet Take 1 tablet (5 mg total) by mouth once a week. Take (1) tablet once a week on the same day of the week   rosuvastatin (CRESTOR) 5 MG tablet Take 1 tablet (5 mg total) by mouth daily.   sodium bicarbonate 650  MG tablet Take 1 tablet by mouth twice daily   Tafamidis (VYNDAMAX) 61 MG CAPS Take 1 capsule (61 mg total) by mouth daily.   Torsemide 40 MG TABS Take 40 mg by mouth daily.   TRUEplus Lancets 33G MISC USE 4 (FOUR) TIMES DAILY   [DISCONTINUED] torsemide (DEMADEX) 20 MG tablet Take 20 mg by mouth daily.     Allergies:   Hydrochlorothiazide   Social History   Socioeconomic History   Marital status: Married    Spouse name: [name]   Number of children: 2   Years of education: 12   Highest education level: 12th grade  Occupational History   Not on file  Tobacco Use   Smoking status: Former    Packs/day: 0.25    Years: 1.00    Additional pack years: 0.00    Total pack years: 0.25    Types: Cigarettes    Quit date: 05/15/1968    Years since quitting: 54.4   Smokeless tobacco: Never   Tobacco comments:    smoked 1969- 1970, up to 1-2 cigarettes/ day  Vaping Use   Vaping Use: Never used  Substance and Sexual Activity   Alcohol use: No     Alcohol/week: 0.0 standard drinks of alcohol   Drug use: No   Sexual activity: Yes  Other Topics Concern   Not on file  Social History Narrative   Walks three times a week, 2-3 miles, occasional walks on treadmill       Patient is right-handed. He lives with his wife in a one level home. He drinks 1 diet Mt. Dew a day and rarely has coffee. He walks 3 x a week for exercise.   Social Determinants of Health   Financial Resource Strain: Low Risk  (02/02/2022)   Overall Financial Resource Strain (CARDIA)    Difficulty of Paying Living Expenses: Not hard at all  Food Insecurity: No Food Insecurity (02/02/2022)   Hunger Vital Sign    Worried About Running Out of Food in the Last Year: Never true    Ran Out of Food in the Last Year: Never true  Transportation Needs: No Transportation Needs (05/01/2022)   PRAPARE - Administrator, Civil Service (Medical): No    Lack of Transportation (Non-Medical): No  Physical Activity: Inactive (02/02/2022)   Exercise Vital Sign    Days of Exercise per Week: 0 days    Minutes of Exercise per Session: 0 min  Stress: No Stress Concern Present (02/02/2022)   [employer] of Occupational Health - Occupational Stress Questionnaire    Feeling of Stress : Not at all  Social Connections: Socially Integrated (02/02/2022)   Social Connection and Isolation Panel [NHANES]    Frequency of Communication with Friends and Family: More than three times a week    Frequency of Social Gatherings with Friends and Family: More than three times a week    Attends Religious Services: More than 4 times per year    Active Member of [employer] or Organizations: Yes    Attends Engineer, structural: More than 4 times per year    Marital Status: Married     Family History: The patient's family history includes Cancer in his father; Diabetes in his father and mother; Heart failure in his brother and mother; Hypertension in his father and mother; Prostate cancer in  his father. There is no history of Stroke, Heart disease, or Colon cancer.  ROS:   Please see the history of present  illness.     All other systems reviewed and are negative.  EKGs/Labs/Other Studies Reviewed:    The following studies were reviewed today: Cardiac Studies & Procedures   CARDIAC CATHETERIZATION  CARDIAC CATHETERIZATION 03/24/2022  Narrative HEMODYNAMICS: RA:   5 mmHg (mean) RV:   54/5 mmHg PA:   52/18 mmHg (31 mean) PCWP:  10-12 mmHg with V waves to 15  Estimated Fick CO/CI   5.4 L/min, 2.9 L/min/m2  PVR     3.7 Wood Units PAPi      >6   IMPRESSION: 1.Low left and right heart filling pressures. 2.PCWP V waves secondary to poor left atrial compliance. 3.Normal cardiac output/index; possibly elevated due to AV fistula. 4.Mildly elevated PVR.  RECOMMENDATIONS: -Hold further diuretics   CARDIAC CATHETERIZATION  CARDIAC CATHETERIZATION 09/20/2020  Narrative  Hemodynamic findings consistent with moderate pulmonary hypertension.  LV end diastolic pressure is severely elevated.  There is no aortic valve stenosis.  Mid LAD to Dist LAD lesion is 80% stenosed.  Ramus lesion is 20% stenosed.  SUMMARY  Severe single-vessel disease with heavily calcified mid LAD 80% stenosis  Acute Combined Systolic and Diastolic Heart Failure (systolic function appears to be reduced, to conserve contrast, LV gram not performed), but LVEDP 32 mmHg and PCWP 28 mmHg.  Systemic Hypertension with Mean Arterial Pressure 123 mmHg  Secondary Pulmonary Hypertension: PAP 67/19 mmHg-mean 39 mmHg with RVP-EDP of 67/2 mmHg-15 mmHg, RVP and RAP of 14 mmHg.  Moderate to severely reduced Cardiac Output-Index: 4.04-2.09   RECOMMENDATIONS  With significant CHF, orthopnea and PND with LVEDP of 32 mmHg, patient is not stable for LAD PCI which would likely require atherectomy.  We will admit to cardiology service for titration of cardiac meds and diuresis: Converting losartan to  [facility], otherwise continue home medications.  IV Lasix 40 mg twice daily  Check 2D echo  Reassess volume status in the next 1 to 2 days to determine feasibility for staged LAD PCI (via femoral access due to extreme tortuosity of the innominate artery.)   Slight scale insulin; consider adding Farxiga or Orion Modest, MD  Findings Coronary Findings Diagnostic  Dominance: Right  Left Main Vessel is large.  Left Anterior Descending Mid LAD to Dist LAD lesion is 80% stenosed. Vessel is the culprit lesion. The lesion is concentric. The lesion is severely calcified.  First Diagonal Branch Vessel is small in size.  First Septal Branch Vessel is small in size.  Ramus Intermedius Vessel is large. Vessel is large in size Ramus lesion is 20% stenosed. The lesion is eccentric.  Left Circumflex Vessel is large. Vessel is angiographically normal.  First Obtuse Marginal Branch Vessel is small in size.  Second Obtuse Marginal Branch Vessel is moderate in size.  Lateral Second Obtuse Marginal Branch Vessel is small in size.  Right Coronary Artery Vessel is large. Vessel is angiographically normal.  Right Posterior Descending Artery Vessel is large in size.  Right Posterior Atrioventricular Artery Vessel is moderate in size.  Intervention  No interventions have been documented.   STRESS TESTS  MYOCARDIAL PERFUSION IMAGING 10/10/2019  Narrative  Nuclear stress EF: 37%. The left ventricular ejection fraction is moderately decreased (30-44%). The LV is severely dilated.  There was no ST segment deviation noted during stress.  This is an intermediate risk study. There is no evidence of ischemia or previous infarction   ECHOCARDIOGRAM  ECHOCARDIOGRAM COMPLETE 03/23/2022  Narrative ECHOCARDIOGRAM REPORT    Patient Name:   Corey [name]  Date of Exam: 03/23/2022 Medical Rec #:  [id number]    Height:       66.0 in Accession #:    [id number]   Weight:        178.0 lb Date of Birth:  Feb 02, 1946    BSA:          1.903 m Patient Age:    75 years     BP:           136/122 mmHg Patient Gender: M            HR:           48 bpm. Exam Location:  Inpatient  Procedure: 2D Echo, Cardiac Doppler, Color Doppler and Intracardiac Opacification Agent  Indications:    CHF-Acute Systolic I50.21  History:        Patient has prior history of Echocardiogram examinations, most recent 09/30/2020. CAD; Arrythmias:Bradycardia with first-degree block. Chronic systolic heart failure/cardiac amyloidosis. History of pericardial effusion s/p pericardial window.  Sonographer:    Leta Jungling RDCS Referring Phys: [phone] ERIC CHEN  IMPRESSIONS   1. Speckled appearance to LV myocardium consistent with diagnosis of amyloid Global hypokinesis worse in the posterior lateral wall . Left ventricular ejection fraction, by estimation, is 30 to 35%. The left ventricle has moderately decreased function. The left ventricle demonstrates global hypokinesis. There is severe left ventricular hypertrophy. Left ventricular diastolic parameters are indeterminate. Elevated left ventricular end-diastolic pressure. 2. Right ventricular systolic function is normal. The right ventricular size is normal. There is moderately elevated pulmonary artery systolic pressure. 3. Left atrial size was moderately dilated. 4. Right atrial size was moderately dilated. 5. The mitral valve is normal in structure. Mild mitral valve regurgitation. No evidence of mitral stenosis. 6. The aortic valve is tricuspid. There is mild calcification of the aortic valve. There is mild thickening of the aortic valve. Aortic valve regurgitation is not visualized. Aortic valve sclerosis is present, with no evidence of aortic valve stenosis. 7. Aortic dilatation noted. There is moderate dilatation of the ascending aorta, measuring 41 mm. 8. The inferior vena cava is dilated in size with <50% respiratory variability,  suggesting right atrial pressure of 15 mmHg.  FINDINGS Left Ventricle: Speckled appearance to LV myocardium consistent with diagnosis of amyloid Global hypokinesis worse in the posterior lateral wall. Left ventricular ejection fraction, by estimation, is 30 to 35%. The left ventricle has moderately decreased function. The left ventricle demonstrates global hypokinesis. Definity contrast agent was given IV to delineate the left ventricular endocardial borders. The left ventricular internal cavity size was normal in size. There is severe left ventricular hypertrophy. Left ventricular diastolic parameters are indeterminate. Elevated left ventricular end-diastolic pressure.  Right Ventricle: The right ventricular size is normal. No increase in right ventricular wall thickness. Right ventricular systolic function is normal. There is moderately elevated pulmonary artery systolic pressure. The tricuspid regurgitant velocity is 2.86 m/s, and with an assumed right atrial pressure of 15 mmHg, the estimated right ventricular systolic pressure is 47.7 mmHg.  Left Atrium: Left atrial size was moderately dilated.  Right Atrium: Right atrial size was moderately dilated.  Pericardium: There is no evidence of pericardial effusion.  Mitral Valve: The mitral valve is normal in structure. Mild mitral valve regurgitation. No evidence of mitral valve stenosis.  Tricuspid Valve: The tricuspid valve is normal in structure. Tricuspid valve regurgitation is mild . No evidence of tricuspid stenosis.  Aortic Valve: The aortic valve is tricuspid. There is mild calcification of the aortic valve.  There is mild thickening of the aortic valve. Aortic valve regurgitation is not visualized. Aortic valve sclerosis is present, with no evidence of aortic valve stenosis.  Pulmonic Valve: The pulmonic valve was normal in structure. Pulmonic valve regurgitation is mild. No evidence of pulmonic stenosis.  Aorta: Aortic dilatation  noted. There is moderate dilatation of the ascending aorta, measuring 41 mm.  Venous: The inferior vena cava is dilated in size with less than 50% respiratory variability, suggesting right atrial pressure of 15 mmHg.  IAS/Shunts: No atrial level shunt detected by color flow Doppler.   LEFT VENTRICLE PLAX 2D LVIDd:         4.70 cm      Diastology LVIDs:         3.60 cm      LV e' medial:    5.66 cm/s LV PW:         1.70 cm      LV E/e' medial:  18.9 LV IVS:        2.15 cm      LV e' lateral:   7.29 cm/s LVOT diam:     2.20 cm      LV E/e' lateral: 14.7 LV SV:         79 LV SV Index:   41 LVOT Area:     3.80 cm  LV Volumes (MOD) LV vol d, MOD A2C: 244.0 ml LV vol d, MOD A4C: 187.5 ml LV vol s, MOD A2C: 133.0 ml LV vol s, MOD A4C: 111.0 ml LV SV MOD A2C:     111.0 ml LV SV MOD A4C:     187.5 ml LV SV MOD BP:      92.3 ml  RIGHT VENTRICLE RV S prime:     5.00 cm/s TAPSE (M-mode): 1.6 cm  LEFT ATRIUM             Index        RIGHT ATRIUM           Index LA diam:        4.50 cm 2.36 cm/m   RA Area:     23.10 cm LA Vol (A2C):   87.3 ml 45.87 ml/m  RA Volume:   67.60 ml  35.52 ml/m LA Vol (A4C):   61.9 ml 32.52 ml/m LA Biplane Vol: 75.4 ml 39.62 ml/m AORTIC VALVE             PULMONIC VALVE LVOT Vmax:   103.00 cm/s PR End Diast Vel: 8.88 msec LVOT Vmean:  76.600 cm/s LVOT VTI:    0.207 m  AORTA Ao Root diam: 2.90 cm Ao Asc diam:  4.05 cm  MITRAL VALVE                TRICUSPID VALVE MV Area (PHT): 3.91 cm     TR Peak grad:   32.7 mmHg MV Decel Time: 194 msec     TR Vmax:        286.00 cm/s MV E velocity: 107.00 cm/s SHUNTS Systemic VTI:  0.21 m Systemic Diam: 2.20 cm  Charlton Haws MD Electronically signed by Charlton Haws MD Signature Date/Time: 03/23/2022/2:24:17 PM    Final   TEE  ECHO TEE 06/26/2022  Narrative TRANSESOPHOGEAL ECHO REPORT    Patient Name:   Corey [name] Date of Exam: 06/23/2022 Medical Rec #:  [id number]    Height:       66.0  in Accession #:    [id number]   Weight:  170.0 lb Date of Birth:  February 17, 1946    BSA:          1.866 m Patient Age:    76 years     BP:           146/88 mmHg Patient Gender: M            HR:           58 bpm. Exam Location:  Inpatient  Procedure: Transesophageal Echo, Cardiac Doppler and Color Doppler  Indications:     Atrial fibrillation I48.91  History:         Patient has prior history of Echocardiogram examinations, most recent 04/04/2022. CHF, Previous Myocardial Infarction and CAD, Stroke; Risk Factors:Sleep Apnea, Hypertension and Diabetes. Amyloidosis.  Sonographer:     Leta Jungling RDCS Referring Phys:  [id number] Maurice Small Diagnosing Phys: Dorthula Nettles  PROCEDURE: After discussion of the risks and benefits of a TEE, an informed consent was obtained from the patient. The patient was intubated. TEE procedure time was 4 minutes. The transesophogeal probe was passed without difficulty through the esophogus of the patient. Imaged were obtained with the patient in a supine position. Sedation performed by different physician. The patient was monitored while under deep sedation. Anesthestetic sedation was provided intravenously by Anesthesiology: 100mg  of Propofol, 80mg  of Lidocaine. Image quality was excellent. The patient developed no complications during the procedure.  IMPRESSIONS   1. Left ventricular ejection fraction, by estimation, is 30 to 35%. The left ventricle has moderately decreased function. There is severe concentric left ventricular hypertrophy. Left ventricular diastolic function could not be evaluated. 2. Right ventricular systolic function is mildly reduced. The right ventricular size is normal. Mildly increased right ventricular wall thickness. 3. Left atrial size was severely dilated. No left atrial/left atrial appendage thrombus was detected. 4. Right atrial size was moderately dilated. 5. The mitral valve is normal in structure. No evidence of  mitral valve regurgitation. 6. Tricuspid valve regurgitation is mild to moderate. 7. The aortic valve is normal in structure. Aortic valve regurgitation is not visualized.  FINDINGS Left Ventricle: Left ventricular ejection fraction, by estimation, is 30 to 35%. The left ventricle has moderately decreased function. The left ventricular internal cavity size was normal in size. There is severe concentric left ventricular hypertrophy. Left ventricular diastolic function could not be evaluated.  Right Ventricle: The right ventricular size is normal. Mildly increased right ventricular wall thickness. Right ventricular systolic function is mildly reduced.  Left Atrium: Left atrial size was severely dilated. No left atrial/left atrial appendage thrombus was detected.  Right Atrium: Right atrial size was moderately dilated.  Pericardium: There is no evidence of pericardial effusion.  Mitral Valve: The mitral valve is normal in structure. No evidence of mitral valve regurgitation.  Tricuspid Valve: The tricuspid valve is normal in structure. Tricuspid valve regurgitation is mild to moderate.  Aortic Valve: The aortic valve is normal in structure. Aortic valve regurgitation is not visualized.  Pulmonic Valve: The pulmonic valve was not assessed. Pulmonic valve regurgitation is trivial.  Aorta: The aortic root was not well visualized.  IAS/Shunts: No atrial level shunt detected by color flow Doppler.  Aditya Sabharwal Electronically signed by Dorthula Nettles Signature Date/Time: 06/26/2022/5:58:15 PM    Final      PYP SCAN  MYOCARDIAL AMYLOID PLANAR AND SPECT 12/31/2020  Narrative Findings are equivocal for TTR amyloidosis.  Visual score is 1 and H/CLL ratio is 1.4.         EKG:  09/27/2022: Atrial flutter heart rate 46 bpm variable AV conduction nonspecific interventricular conduction delay.  Recent Labs: 01/05/2022: ALT 31; Pro B Natriuretic peptide (BNP) 1,740.0 03/22/2022: B  Natriuretic Peptide 1,571.3 08/21/2022: BUN 65; Creatinine, Ser 2.97; Hemoglobin 11.3; Platelets 174; Potassium 4.4; Sodium 143  Recent Lipid Panel    Component Value Date/Time   CHOL 86 03/23/2022 1615   TRIG 54 03/23/2022 1615   HDL 44 03/23/2022 1615   CHOLHDL 2.0 03/23/2022 1615   VLDL 11 03/23/2022 1615   LDLCALC 31 03/23/2022 1615   LDLCALC 61 01/28/2020 1000     Risk Assessment/Calculations:              Physical Exam:    VS:  BP (!) 140/78   Pulse (!) 45   Ht 5\' 6"  (1.676 m)   Wt 163 lb (73.9 kg)   SpO2 95%   BMI 26.31 kg/m     Wt Readings from Last 3 Encounters:  09/27/22 163 lb (73.9 kg)  09/04/22 151 lb (68.5 kg)  08/21/22 167 lb 6.4 oz (75.9 kg)     GEN:  Well nourished, well developed in no acute distress HEENT: Normal NECK: No JVD; No carotid bruits LYMPHATICS: No lymphadenopathy CARDIAC: RRR, no murmurs, rubs, gallops, subcutaneous ICD noted. RESPIRATORY:  Clear to auscultation without rales, wheezing or rhonchi  ABDOMEN: Soft, non-tender, non-distended MUSCULOSKELETAL:  No edema; No deformity  SKIN: Warm and dry NEUROLOGIC:  Alert and oriented x 3 PSYCHIATRIC:  Normal affect   ASSESSMENT:    1. Paroxysmal atrial fibrillation (HCC)   2. Cardiomyopathy, ischemic   3. Atrial flutter, unspecified type (HCC)   4. Chronic kidney disease, stage V (HCC)    PLAN:    In order of problems listed above:  ATTR amyloidosis cardiac - Continue with current medication management.  Chronic systolic heart failure with ejection fraction less than 35% despite goal-directed medical therapy for over 3 months - Subcutaneous ICD was implanted.  This is to reduce sudden cardiac death. -She has been seen in advanced heart failure clinic as well. -On metolazone once weekly as well as torsemide.  End-stage renal disease with left-sided fistula -Followed by nephrology.  Not currently on hemodialysis.  Has had increased torsemide dosing.  Dietary  modifications.  Atrial fibrillation/flutter - Status post ablation.  All flutter circuits were not able to be ablated.  Baseline bradycardia noted.  Chronic anticoagulation - Continuing with apixaban.  No evidence of bleeding.  Coronary artery disease - No anginal symptoms at that time secondary to 80% LAD lesion on 2022 cardiac cath.  Avoided further left heart catheterization in the setting of progressively worsening renal failure.  Pericardial effusion status post window - Stable.          Medication Adjustments/Labs and Tests Ordered: Current medicines are reviewed at length with the patient today.  Concerns regarding medicines are outlined above.  Orders Placed This Encounter  Procedures   EKG 12-Lead   No orders of the defined types were placed in this encounter.   Patient Instructions  Medication Instructions:  The current medical regimen is effective;  continue present plan and medications.  *If you need a refill on your cardiac medications before your next appointment, please call your pharmacy*  Follow-Up: At [facility], you and your health needs are our priority.  As part of our continuing mission to provide you with exceptional heart care, we have created designated Provider Care Teams.  These Care Teams include your primary Cardiologist (physician)  and Advanced Practice Providers (APPs -  Physician Assistants and Nurse Practitioners) who all work together to provide you with the care you need, when you need it.  We recommend signing up for the patient portal called "MyChart".  Sign up information is provided on this After Visit Summary.  MyChart is used to connect with patients for Virtual Visits (Telemedicine).  Patients are able to view lab/test results, encounter notes, upcoming appointments, etc.  Non-urgent messages can be sent to your provider as well.   To learn more about what you can do with MyChart, go to [email or web address].    Your next  appointment:   6 month(s)  Provider:   Donato Schultz, MD        Signed, Donato Schultz, MD  09/27/2022 10:02 AM    [facility]

## 2022-09-27 NOTE — Addendum Note (Signed)
Addended by: Sharin Grave on: 09/27/2022 10:36 AM   Modules accepted: Orders

## 2022-09-28 ENCOUNTER — Encounter: Payer: Self-pay | Admitting: Endocrinology

## 2022-09-28 ENCOUNTER — Ambulatory Visit: Payer: Medicare HMO | Admitting: Endocrinology

## 2022-09-28 ENCOUNTER — Other Ambulatory Visit (HOSPITAL_COMMUNITY): Payer: Self-pay

## 2022-09-28 VITALS — BP 128/84 | HR 88 | Ht 66.0 in | Wt 162.2 lb

## 2022-09-28 DIAGNOSIS — E1165 Type 2 diabetes mellitus with hyperglycemia: Secondary | ICD-10-CM | POA: Diagnosis not present

## 2022-09-28 DIAGNOSIS — Z7984 Long term (current) use of oral hypoglycemic drugs: Secondary | ICD-10-CM | POA: Diagnosis not present

## 2022-09-28 LAB — POCT GLUCOSE (DEVICE FOR HOME USE): Glucose Fasting, POC: 91 mg/dL (ref 70–99)

## 2022-09-28 MED ORDER — SITAGLIPTIN PHOSPHATE 50 MG PO TABS
50.0000 mg | ORAL_TABLET | Freq: Every day | ORAL | 2 refills | Status: DC
Start: 2022-09-28 — End: 2022-09-28

## 2022-09-28 MED ORDER — SITAGLIPTIN PHOSPHATE 50 MG PO TABS
50.0000 mg | ORAL_TABLET | Freq: Every day | ORAL | 2 refills | Status: DC
  Filled 2022-09-28 – 2022-11-06 (×2): qty 30, 30d supply, fill #0

## 2022-09-28 NOTE — Progress Notes (Signed)
Patient ID: Corey [name], male   DOB: 26-Nov-1945, 77 y.o.   MRN: [id number]   Reason for Appointment : Followup of various problems  History of Present Illness          Diagnosis: Type 2 diabetes mellitus, date of diagnosis: 1997      Past history: The patient was initially treated with various oral hypoglycemic drugs and details are not available. About 2009 he was started on insulin and has either taken premixed insulin or Lantus once a day He had difficulty affording his Lantus insulin and had preferred to take premixed insulin Subsequently insulin was tapered off  Recent history:    Oral hypoglycemic drugs:   None, was on Januvia 25 mg daily  Current blood sugar patterns and problems:  His A1c which was lower than usual at 6 is back up to 7% Fructosamine is higher than usual at 404  He was seen in 1/24 and was told to leave off the Januvia since his blood sugars appear to be fairly normal without taking this for about a month Although he was given a new meter and asked to check his blood sugars he did not bring his monitor today and not clear how often he is monitoring) Although his lab glucose is 110 his A1c and fructosamine are significantly higher He has known significant renal failure He is not doing any formal walking for exercise Weight has been fluctuating  Glucose monitoring:  done less than 1 time a day         Glucometer:  Accu-Chek       Blood Glucose readings by recall Blood sugar range 105-125  Self-care:       Meals: 2-3 meals per day. Egg in am at 9-10 am or grits, rarely sandwich at lunch. His largest meal is at suppertime at about 8 pm.         Dietician visit: Most recent: 02/2014               Compliance with the medical regimen:  Fair.    Wt Readings from Last 3 Encounters:  09/28/22 162 lb 3.2 oz (73.6 kg)  09/27/22 163 lb (73.9 kg)  09/04/22 151 lb (68.5 kg)    Lab Results  Component Value Date   HGBA1C 7.0 (H) 09/19/2022   HGBA1C  6.0 05/11/2022   HGBA1C 5.7 (H) 03/23/2022    Lab Results  Component Value Date   MICROALBUR 8.5 (H) 11/23/2020   LDLCALC 31 03/23/2022   CREATININE 2.97 (H) 08/21/2022   Lab Results  Component Value Date   FRUCTOSAMINE 404 (H) 09/19/2022   FRUCTOSAMINE 366 (H) 10/11/2021   FRUCTOSAMINE 287 (H) 08/09/2021    Office Visit on 09/28/2022  Component Date Value Ref Range Status   Glucose Fasting, POC 09/28/2022 91  70 - 99 mg/dL Final   Other active medical problems addressed: See review of systems   Allergies as of 09/28/2022       Reactions   Hydrochlorothiazide Other (See Comments)   Gout , uncontrolled diabetes and renal insufficiency        Medication List        Accurate as of Sep 28, 2022 11:15 AM. If you have any questions, ask your nurse or doctor.          Accu-Chek Aviva Plus test strip Generic drug: glucose blood TEST BLOOD SUGAR TWICE DAILY AS DIRECTED   acetaminophen 500 MG tablet Commonly known as: TYLENOL Take 1,000 mg  by mouth every 6 (six) hours as needed for moderate pain.   albuterol 108 (90 Base) MCG/ACT inhaler Commonly known as: VENTOLIN HFA Inhale 2 puffs into the lungs every 4 (four) hours as needed for wheezing or shortness of breath.   allopurinol 100 MG tablet Commonly known as: ZYLOPRIM Take 50 mg by mouth in the morning.   apixaban 5 MG Tabs tablet Commonly known as: ELIQUIS Take 1 tablet (5 mg total) by mouth 2 (two) times daily.   bismuth subsalicylate 262 MG/15ML suspension Commonly known as: PEPTO BISMOL Take 30 mLs by mouth every 6 (six) hours as needed for indigestion.   cholecalciferol 25 MCG (1000 UNIT) tablet Commonly known as: VITAMIN D3 Take 1,000 Units by mouth daily.   colchicine 0.6 MG tablet Commonly known as: Colcrys Take 2 tabs once and then one tab one hour later as needed for gout   hydrALAZINE 25 MG tablet Commonly known as: APRESOLINE TAKE 1 TABLET BY MOUTH THREE TIMES DAILY   metolazone 5 MG  tablet Commonly known as: ZAROXOLYN Take 1 tablet (5 mg total) by mouth once a week on the same day of the week.   rosuvastatin 5 MG tablet Commonly known as: CRESTOR Take 1 tablet (5 mg total) by mouth daily.   sodium bicarbonate 650 MG tablet Take 1 tablet by mouth twice daily   Torsemide 40 MG Tabs Take 1 tablet by mouth daily.   TRUEplus Lancets 33G Misc USE 4 (FOUR) TIMES DAILY   Vyndamax 61 MG Caps Generic drug: Tafamidis Take 1 capsule (61 mg total) by mouth daily.        Allergies:  Allergies  Allergen Reactions   Hydrochlorothiazide Other (See Comments)    Gout , uncontrolled diabetes and renal insufficiency    Past Medical History:  Diagnosis Date   Cataract    CHF (congestive heart failure) (HCC)    Chronic heart failure with preserved ejection fraction (HFpEF) (HCC)    Colon polyp 2003   Dr Victorino Dike; F/U was to be 2008( not completed)   Coronary artery disease    CVA (cerebral infarction) 2014   Diabetes mellitus    Diverticulosis 2003   Dyspnea    with exertion   Gout    Hereditary cardiac amyloidosis (HCC)    Hypertension    Myocardial infarction (HCC) 09/19/2012   PEA cardiac arrest in setting of acute respiratory failure/pulmonary edema   OSA (obstructive sleep apnea) 03/27/2018   Pneumonia 09/29/2011   Avelox X 10 days as OP   Prostate cancer (HCC)    Renal insufficiency    Seizures (HCC) 09/19/2012   not treated for seizure disorder; had a seizure after stroke 2014; no seizure since then   Stroke [facility])     Past Surgical History:  Procedure Laterality Date   A-FLUTTER ABLATION N/A 06/23/2022   Procedure: A-FLUTTER ABLATION;  Surgeon: Maurice Small, MD;  Location: [facility] INVASIVE CV LAB;  Service: Cardiovascular;  Laterality: N/A;   A/V FISTULAGRAM Left 04/28/2022   Procedure: A/V Fistulagram;  Surgeon: Chuck Hint, MD;  Location: [facility] INVASIVE CV LAB;  Service: Cardiovascular;  Laterality: Left;   AV FISTULA PLACEMENT Left  01/31/2022   Procedure: LEFT ARM ARTERIOVENOUS (AV) FISTULA CREATION;  Surgeon: Maeola Harman, MD;  Location: [facility] OR;  Service: Vascular;  Laterality: Left;  regional block to left arm   CARDIOVERSION N/A 03/24/2022   Procedure: CARDIOVERSION;  Surgeon: Dorthula Nettles, DO;  Location: [facility] ENDOSCOPY;  Service: Cardiovascular;  Laterality: N/A;   COLONOSCOPY W/ POLYPECTOMY  2003   no F/U (SOC discussed 12/03/12)   INGUINAL HERNIA REPAIR Left 06/16/2021   Procedure: LAPAROSCOPIC, POSSIBLY OPEN LEFT INGUINAL HERNIA REPAIR WITH MESH;  Surgeon: Quentin Ore, MD;  Location: [facility] ORS;  Service: General;  Laterality: Left;   INSERTION OF MESH N/A 07/16/2017   Procedure: INSERTION OF MESH;  Surgeon: Berna Bue, MD;  Location: [facility] OR;  Service: General;  Laterality: N/A;   PEG PLACEMENT  2014   10/08/12-12/10/12   PEG TUBE REMOVAL  2014   PERIPHERAL VASCULAR BALLOON ANGIOPLASTY  04/28/2022   Procedure: PERIPHERAL VASCULAR BALLOON ANGIOPLASTY;  Surgeon: Chuck Hint, MD;  Location: [facility] INVASIVE CV LAB;  Service: Cardiovascular;;   PROSTATE BIOPSY     RIGHT HEART CATH N/A 03/24/2022   Procedure: RIGHT HEART CATH;  Surgeon: Dorthula Nettles, DO;  Location: [facility] INVASIVE CV LAB;  Service: Cardiovascular;  Laterality: N/A;   RIGHT/LEFT HEART CATH AND CORONARY ANGIOGRAPHY N/A 09/20/2020   Procedure: RIGHT/LEFT HEART CATH AND CORONARY ANGIOGRAPHY;  Surgeon: Marykay Lex, MD;  Location: [facility] INVASIVE CV LAB;  Service: Cardiovascular;  Laterality: N/A;   SUBQ ICD IMPLANT N/A 09/04/2022   Procedure: SUBQ ICD IMPLANT;  Surgeon: Maurice Small, MD;  Location: [facility] INVASIVE CV LAB;  Service: Cardiovascular;  Laterality: N/A;   TEE WITHOUT CARDIOVERSION N/A 09/27/2020   Procedure: TRANSESOPHAGEAL ECHOCARDIOGRAM (TEE);  Surgeon: Linden Dolin, MD;  Location: [facility] OR;  Service: Thoracic;  Laterality: N/A;   TEE WITHOUT CARDIOVERSION N/A 03/24/2022   Procedure: TRANSESOPHAGEAL  ECHOCARDIOGRAM (TEE);  Surgeon: Dorthula Nettles, DO;  Location: [facility] ENDOSCOPY;  Service: Cardiovascular;  Laterality: N/A;   TEE WITHOUT CARDIOVERSION N/A 06/23/2022   Procedure: TRANSESOPHAGEAL ECHOCARDIOGRAM (TEE);  Surgeon: Maurice Small, MD;  Location: [facility] INVASIVE CV LAB;  Service: Cardiovascular;  Laterality: N/A;   TRACHEOSTOMY  2014   09/30/12-10/20/12   VENTRAL HERNIA REPAIR N/A 07/16/2017   Procedure: LAPAROSCOPIC VENTRAL HERNIA REPAIR WITH MESH;  Surgeon: Berna Bue, MD;  Location: [facility] OR;  Service: General;  Laterality: N/A;   wrist aspiration  02/16/2012    monosodium urate crystals; Dr Melvyn Novas    Family History  Problem Relation Age of Onset   Heart failure Mother    Hypertension Mother    Diabetes Mother    Hypertension Father    Prostate cancer Father    Diabetes Father    Cancer Father    Heart failure Brother    Stroke Neg Hx    Heart disease Neg Hx    Colon cancer Neg Hx     Social History:  reports that he quit smoking about 54 years ago. His smoking use included cigarettes. He has a 0.25 pack-year smoking history. He has never used smokeless tobacco. He reports that he does not drink alcohol and does not use drugs.    Review of Systems   Lipids: He has been started on Crestor 40 mg daily possibly because of his cardiac history  Lab Results  Component Value Date   CHOL 86 03/23/2022   HDL 44 03/23/2022   LDLCALC 31 03/23/2022   TRIG 54 03/23/2022   CHOLHDL 2.0 03/23/2022    Eye exams: Last done in 1/20 without retinopathy     He has renal insufficiency of unclear etiology with variable creatinine levels as follows: He has had a shunt in place on the left side  Lab Results  Component Value Date   CREATININE 2.97 (H)  08/21/2022   CREATININE 3.52 (H) 07/03/2022   CREATININE 3.32 (H) 06/14/2022     HYPERTENSION: on losartan 50 mg, amlodipine , clonidine 0.3 mg and hydralazine as well as metoprolol  Currently managed by PCP and  cardiologist   He takes DEMADEX 40 mg   BP Readings from Last 3 Encounters:  09/28/22 128/84  09/27/22 (!) 140/78  09/21/22 110/70     Previously he has had significant foot deformities and podiatrist has requested diabetic shoes    Physical Examination:  BP 128/84   Pulse 88   Ht 5\' 6"  (1.676 m)   Wt 162 lb 3.2 oz (73.6 kg)   BMI 26.18 kg/m     ASSESSMENT/PLAN:  Diabetes type 2, uncontrolled   See history of present illness for discussion of current diabetes management, blood sugar patterns and problems identified  A1c is compared to 6% Now fructosamine is higher  Without any pharmacological treatment for his diabetes his blood sugars appear to be getting progressively higher Previously had difficulty affording Januvia  Unclear whether he has high readings after meals as likely not checking consistently  Diet appears to be fairly good Since he is not able to afford Rybelsus likely and has limited choices because of renal insufficiency will have him go back to Januvia but try 50 mg, half tablet daily Discussed checking readings at least half the time after meals and discussed blood sugar targets To bring monitor for download next visit  There are no Patient Instructions on file for this visit.   Reather Littler 09/28/2022, 11:15 AM

## 2022-09-28 NOTE — Patient Instructions (Addendum)
Check blood sugars on waking up 1-2 days a week  Also check blood sugars about 2 hours after meals and do this after different meals by rotation  Recommended blood sugar levels on waking up are 90-130 and about 2 hours after meal is 130-160  Please bring your blood sugar monitor to each visit, thank you  Januvia 1/2 of 50mg  daily

## 2022-09-29 ENCOUNTER — Other Ambulatory Visit (HOSPITAL_COMMUNITY): Payer: Self-pay

## 2022-10-05 ENCOUNTER — Other Ambulatory Visit (HOSPITAL_COMMUNITY): Payer: Self-pay

## 2022-10-09 ENCOUNTER — Other Ambulatory Visit: Payer: Self-pay | Admitting: Internal Medicine

## 2022-10-10 ENCOUNTER — Other Ambulatory Visit (HOSPITAL_COMMUNITY): Payer: Self-pay

## 2022-10-12 ENCOUNTER — Other Ambulatory Visit (HOSPITAL_COMMUNITY): Payer: Self-pay

## 2022-10-12 ENCOUNTER — Other Ambulatory Visit: Payer: Self-pay

## 2022-11-01 ENCOUNTER — Other Ambulatory Visit: Payer: Self-pay

## 2022-11-06 ENCOUNTER — Telehealth: Payer: Self-pay | Admitting: Cardiovascular Disease

## 2022-11-06 ENCOUNTER — Other Ambulatory Visit (HOSPITAL_COMMUNITY): Payer: Self-pay

## 2022-11-06 NOTE — Telephone Encounter (Signed)
Attempted to contact patient left message to call back

## 2022-11-06 NOTE — Telephone Encounter (Signed)
  The patient's wife calling. She informed us that the patient fell and is experiencing swelling in his groin. She clarified that it's on the opposite side from where he had his ablation procedure. The patient is in pain and she would like to know if it would be appropriate to give two Tylenols

## 2022-11-08 ENCOUNTER — Other Ambulatory Visit (HOSPITAL_COMMUNITY): Payer: Self-pay

## 2022-11-09 ENCOUNTER — Ambulatory Visit: Payer: Medicare HMO | Admitting: Cardiovascular Disease

## 2022-11-10 ENCOUNTER — Other Ambulatory Visit (HOSPITAL_COMMUNITY): Payer: Self-pay

## 2022-11-10 ENCOUNTER — Telehealth: Payer: Self-pay | Admitting: Internal Medicine

## 2022-11-10 ENCOUNTER — Telehealth: Payer: Self-pay | Admitting: Cardiology

## 2022-11-10 ENCOUNTER — Other Ambulatory Visit: Payer: Medicare HMO

## 2022-11-10 NOTE — Telephone Encounter (Signed)
Spoke to patient/patient wife who advised patient is asymptomatic, remote transmission received, HR 41 bpm. Patient is ambulatory around the house w/o difficulty and staying well hydrated.   Reviewed with Dr. Nelly Laurence, advised continue to monitor. No new orders obtained. Advised of ED precautions if patient were to become symptomatic. Wife voiced understanding.  EP Scheduling ~ Patient to follow up with EP APP at next available apt.

## 2022-11-10 NOTE — Telephone Encounter (Signed)
Received transferred call from operator and spoke with patient and his wife.  They report palliative care nurse was seeing patient today and his heart rate was 39.  Checked manually and with pulse ox.  They do not know what BP reading was.  Patient is feeling fine.  Denies any dizziness.  He has an ICD.  Will check with device clinic to see if device readings can be checked.

## 2022-11-10 NOTE — Telephone Encounter (Signed)
I called patient to help him send a manual transmission with his home remote monitor. No answer/no voicemail.

## 2022-11-10 NOTE — Telephone Encounter (Signed)
STAT if HR is under 50 or over 120 (normal HR is 60-100 beats per minute)  What is your heart rate? HR 39  Do you have a log of your heart rate readings (document readings)? No  Do you have any other symptoms? No

## 2022-11-10 NOTE — Telephone Encounter (Signed)
Addressed by cardiology

## 2022-11-10 NOTE — Telephone Encounter (Signed)
Dee, a palliative care nurse called to report unusual vitals.   States pt's pulse is at 39-40 with both an apical check and a pulse ox check. Pt is not having any other symptoms.   Geraldine Contras will be at the pt's house for the next 2 hours.   Please call Geraldine Contras for any other info:  [phone]

## 2022-11-12 ENCOUNTER — Other Ambulatory Visit: Payer: Self-pay | Admitting: Internal Medicine

## 2022-11-13 NOTE — Telephone Encounter (Signed)
That is fine. Thanks 

## 2022-11-17 ENCOUNTER — Other Ambulatory Visit: Payer: Self-pay

## 2022-11-20 DIAGNOSIS — E1122 Type 2 diabetes mellitus with diabetic chronic kidney disease: Secondary | ICD-10-CM | POA: Diagnosis not present

## 2022-11-20 DIAGNOSIS — N281 Cyst of kidney, acquired: Secondary | ICD-10-CM | POA: Diagnosis not present

## 2022-11-20 DIAGNOSIS — I130 Hypertensive heart and chronic kidney disease with heart failure and stage 1 through stage 4 chronic kidney disease, or unspecified chronic kidney disease: Secondary | ICD-10-CM | POA: Diagnosis not present

## 2022-11-20 DIAGNOSIS — I3139 Other pericardial effusion (noninflammatory): Secondary | ICD-10-CM | POA: Diagnosis not present

## 2022-11-20 DIAGNOSIS — R809 Proteinuria, unspecified: Secondary | ICD-10-CM | POA: Diagnosis not present

## 2022-11-20 DIAGNOSIS — N184 Chronic kidney disease, stage 4 (severe): Secondary | ICD-10-CM | POA: Diagnosis not present

## 2022-11-20 DIAGNOSIS — I5042 Chronic combined systolic (congestive) and diastolic (congestive) heart failure: Secondary | ICD-10-CM | POA: Diagnosis not present

## 2022-11-20 DIAGNOSIS — N2581 Secondary hyperparathyroidism of renal origin: Secondary | ICD-10-CM | POA: Diagnosis not present

## 2022-11-20 DIAGNOSIS — E8581 Light chain (AL) amyloidosis: Secondary | ICD-10-CM | POA: Diagnosis not present

## 2022-11-21 ENCOUNTER — Telehealth (HOSPITAL_COMMUNITY): Payer: Self-pay | Admitting: *Deleted

## 2022-11-21 NOTE — Telephone Encounter (Signed)
Received request via proficient from Dr Vallery Sa requesting fistulogram due to no bruit or thrill of AVF.  Will give to [facility].

## 2022-12-04 ENCOUNTER — Ambulatory Visit (INDEPENDENT_AMBULATORY_CARE_PROVIDER_SITE_OTHER): Payer: Medicare HMO

## 2022-12-04 DIAGNOSIS — I255 Ischemic cardiomyopathy: Secondary | ICD-10-CM | POA: Diagnosis not present

## 2022-12-04 DIAGNOSIS — I480 Paroxysmal atrial fibrillation: Secondary | ICD-10-CM

## 2022-12-05 ENCOUNTER — Other Ambulatory Visit (HOSPITAL_COMMUNITY): Payer: Self-pay

## 2022-12-05 LAB — CUP PACEART REMOTE DEVICE CHECK
Battery Remaining Percentage: 99 %
Date Time Interrogation Session: 20240722070700
Implantable Lead Connection Status: 753985
Implantable Lead Implant Date: 20240422
Implantable Lead Location: 753860
Implantable Lead Model: 3501
Implantable Lead Serial Number: 255072
Implantable Pulse Generator Implant Date: 20240422
Pulse Gen Serial Number: 303979

## 2022-12-06 ENCOUNTER — Other Ambulatory Visit: Payer: Self-pay | Admitting: Internal Medicine

## 2022-12-06 ENCOUNTER — Other Ambulatory Visit (HOSPITAL_COMMUNITY): Payer: Self-pay

## 2022-12-13 ENCOUNTER — Other Ambulatory Visit: Payer: Self-pay | Admitting: Internal Medicine

## 2022-12-14 ENCOUNTER — Other Ambulatory Visit: Payer: Self-pay | Admitting: *Deleted

## 2022-12-14 DIAGNOSIS — N185 Chronic kidney disease, stage 5: Secondary | ICD-10-CM

## 2022-12-14 NOTE — Progress Notes (Signed)
Remote ICD transmission.   

## 2022-12-15 ENCOUNTER — Other Ambulatory Visit: Payer: Self-pay

## 2022-12-19 ENCOUNTER — Encounter: Payer: Medicare HMO | Admitting: Cardiovascular Disease

## 2022-12-22 ENCOUNTER — Telehealth: Payer: Self-pay | Admitting: Endocrinology

## 2022-12-22 NOTE — Telephone Encounter (Signed)
Patient requesting a refill on test strips for his Accu-check Aviva Pluse [facility] on [address]

## 2022-12-25 ENCOUNTER — Encounter (HOSPITAL_COMMUNITY): Payer: Self-pay

## 2022-12-25 ENCOUNTER — Ambulatory Visit: Payer: Medicare HMO | Attending: Cardiovascular Disease | Admitting: Cardiovascular Disease

## 2022-12-25 ENCOUNTER — Ambulatory Visit: Payer: Medicare HMO

## 2022-12-25 ENCOUNTER — Inpatient Hospital Stay: Admit: 2022-12-25 | Payer: Medicare HMO

## 2022-12-25 ENCOUNTER — Encounter: Payer: Self-pay | Admitting: Cardiovascular Disease

## 2022-12-25 VITALS — BP 88/58 | HR 36 | Ht 66.0 in | Wt 165.2 lb

## 2022-12-25 DIAGNOSIS — E854 Organ-limited amyloidosis: Secondary | ICD-10-CM

## 2022-12-25 DIAGNOSIS — I43 Cardiomyopathy in diseases classified elsewhere: Secondary | ICD-10-CM | POA: Diagnosis not present

## 2022-12-25 DIAGNOSIS — I483 Typical atrial flutter: Secondary | ICD-10-CM | POA: Diagnosis not present

## 2022-12-25 DIAGNOSIS — I5020 Unspecified systolic (congestive) heart failure: Secondary | ICD-10-CM | POA: Diagnosis not present

## 2022-12-25 DIAGNOSIS — I255 Ischemic cardiomyopathy: Secondary | ICD-10-CM | POA: Diagnosis not present

## 2022-12-25 DIAGNOSIS — I480 Paroxysmal atrial fibrillation: Secondary | ICD-10-CM

## 2022-12-25 MED ORDER — ACCU-CHEK AVIVA PLUS VI STRP: ORAL_STRIP | 2 refills | Status: DC

## 2022-12-25 NOTE — Addendum Note (Signed)
Addended by: Pollie Meyer on: 12/25/2022 04:57 PM   Modules accepted: Orders

## 2022-12-25 NOTE — H&P (View-Only) (Signed)
Electrophysiology Office Note:    Date:  12/25/2022   ID:  XUE OVERTON, DOB [birth year], MRN [id number]  PCP:  Pincus Sanes, MD    [facility] Providers Cardiologist:  Donato Schultz, MD Electrophysiologist:  Maurice Small, MD     Referring MD: Pricilla Riffle, MD   History of Present Illness:    Corey [name] is a 77 y.o. male with a hx detailed below, significant for ATTR cardiac amyloid, pericardial effusion status post window, CHFrEF, CAD ESRD approaching dialysis, and atrial fibrillation and flutter referred for arrhythmia management.      In 2022, [name] presented to [facility] with DOE/angina. LHC at that time demonstrated 80% occlusion of the LAD, however, PCI was deferred due to markedly elevated LVEDP. LVEF by echo at that time of 35%-40%. Due to a large pericardial effusion, he had a window done with biopsies at that time compatible with amyloidosis. AL evaluation was negative. PYP was equivocal. Genetic testing consistent with Val142Ile TTR.   He was admitted in early November 2023 with volume overload and an atrial fibrillation occasionally organizing into flutter. He was diuresed, underwent TEE/DCCV x 2. When in sinus rhythm, he was often bradycardic with sinus brady and Mobitz I AV block. I saw him a few times during this admission and recommended placement of a holter monitor on discharge to assess chronotropic competence.  He underwent EP study and ablation for atrial flutter in March, 2024. He had multiple arrhythmias -- multiple flutters involving variable circuits as well as a transient focal tachycardia. I performed a CTI ablation as at least one of the flutters involved the cavo-tricuspid isthmus. I elected not to pursue all the multiple flutter circuits. Today he has an atypical atrial flutter. He continues to have shortness of breath and fatigue but no palpitations.  Due to the presence of a left-sided AV fistula and impending dialysis, I opted to place  a subcutaneous ICD for primary prevention of SCD. At the time, he had intermittent bradycardia but denied having symptoms.  His bradycardia has progressed. ECG 5/15 shows a severely prolonged AV block. Today, he is in flutter again with slow ventricular rates (36 bpm).      The patient reports that he is feeling well. He feels dizzy at times.   EKGs/Labs/Other Studies Reviewed Today:    TTE 03/23/2022 Speckled appearance consistent with amyloid Global hypokinesis, EF 30-35%, severe LVH LA and RA are moderately dilated  EKG:  Last EKG results: today - atrial flutter, atypical   Recent Labs: 01/05/2022: ALT 31; Pro B Natriuretic peptide (BNP) 1,740.0 03/22/2022: B Natriuretic Peptide 1,571.3 08/21/2022: BUN 65; Creatinine, Ser 2.97; Hemoglobin 11.3; Platelets 174; Potassium 4.4; Sodium 143     Physical Exam:    VS:  BP (!) 88/58 (BP Location: Right Arm, Patient Position: Sitting, Cuff Size: Normal)   Pulse (!) 36   Ht 5\' 6"  (1.676 m)   Wt 165 lb 3.2 oz (74.9 kg)   SpO2 96%   BMI 26.66 kg/m     Wt Readings from Last 3 Encounters:  12/25/22 165 lb 3.2 oz (74.9 kg)  09/28/22 162 lb 3.2 oz (73.6 kg)  09/27/22 163 lb (73.9 kg)     GEN:  Well nourished, well developed in no acute distress CARDIAC: RRR, no murmurs, rubs, gallops RESPIRATORY:  Normal work of breathing MUSCULOSKELETAL: no edema    ASSESSMENT & PLAN:    Atrial flutter and fibrillation:  Pt has multiple flutter circuits not  amenable to ablation. ECGs dating back to March 2024 show persistent atrial flutter  Bradycardia:  Patient was decreased AV conduction in the setting of amyloid.   He has denied symptoms in the past, but now with a heart rate of 36 we should not delay pacemaker placement any longer.  I am going to admit him directly to the hospital to expedite device placement. He should be n.p.o. after midnight tonight for possible device placement tomorrow  Secondary hypercoagulable state:  hold  apixaban for possible pacemaker placement  CHFrEF:  EF has remained severely depressed at < 35% despite GDMT for over 3 months.   SICD was placed due to ESRD and presence of left-sided AV fistula Betablocker held due to severe LVH with restrictive cardiomyopathy  cardiac amyloid. With slow AV conduction  ESRD with fistula: left-side fistula.        Medication Adjustments/Labs and Tests Ordered: Current medicines are reviewed at length with the patient today.  Concerns regarding medicines are outlined above.  Orders Placed This Encounter  Procedures   LONG TERM MONITOR-LIVE TELEMETRY (3-14 DAYS)   EKG 12-Lead   No orders of the defined types were placed in this encounter.    Signed, Maurice Small, MD  12/25/2022 11:40 AM    [facility]

## 2022-12-25 NOTE — H&P (Incomplete)
ELECTROPHYSIOLOGY H&P NOTE    Patient ID: Corey [name] MRN: [id number], DOB/AGE: [birth year] 75 y.o.  Admit date: ***   Primary Physician: Pincus Sanes, MD Primary Cardiologist: Donato Schultz, MD  Electrophysiologist: Dr. Nelly Laurence   Reason for admission: Symptomatic bradycardia  Patient Profile: Corey [name] is a 77 y.o. male with a hx detailed below, significant for ATTR cardiac amyloid, pericardial effusion status post window, CHFrEF, CAD ESRD approaching dialysis, and atrial fibrillation and flutter admitted from the office for symptomatic bradycardia  HPI:   In 2022, Corey [name] presented to [facility] with DOE/angina. LHC at that time demonstrated 80% occlusion of the LAD, however, PCI was deferred due to markedly elevated LVEDP. LVEF by echo at that time of 35%-40%. Due to a large pericardial effusion, he had a window done with biopsies at that time compatible with amyloidosis. AL evaluation was negative. PYP was equivocal. Genetic testing consistent with Val142Ile TTR.    Admitted 03/2022 with volume overload and AF + AFL. Diuresed and  underwent TEE/DCCV x 2. When in sinus rhythm, he was often bradycardic with sinus brady and Mobitz I AV block. Seen by EP a few times during this admission and recommended placement of a holter monitor on discharge to assess chronotropic competence.   07/2022 he underwent EP study and ablation for atrial flutter. He had multiple arrhythmias -- multiple flutters involving variable circuits as well as a transient focal tachycardia. Dr. Nelly Laurence performed a CTI ablation as at least one of the flutters involved the cavo-tricuspid isthmus. Elected not to pursue all the multiple flutter circuits.    Due to the presence of a left-sided AV fistula and impending dialysis, We opted to place a subcutaneous ICD for primary prevention of SCD. At the time, he had intermittent bradycardia but denied having symptoms.  Pt seen in office, today 8/12 with progressive  bradycardia and SOB. EKG showed AFL with HR at 36 bpm. He was scheduled for direct admission for tentative PPM 8/13.   Labs Pending.  Past Medical History:  Diagnosis Date   Cataract    CHF (congestive heart failure) (HCC)    Chronic heart failure with preserved ejection fraction (HFpEF) (HCC)    Colon polyp 2003   Dr Victorino Dike; F/U was to be 2008( not completed)   Coronary artery disease    CVA (cerebral infarction) 2014   Diabetes mellitus    Diverticulosis 2003   Dyspnea    with exertion   Gout    Hereditary cardiac amyloidosis (HCC)    Hypertension    Myocardial infarction (HCC) 09/19/2012   PEA cardiac arrest in setting of acute respiratory failure/pulmonary edema   OSA (obstructive sleep apnea) 03/27/2018   Pneumonia 09/29/2011   Avelox X 10 days as OP   Prostate cancer (HCC)    Renal insufficiency    Seizures (HCC) 09/19/2012   not treated for seizure disorder; had a seizure after stroke 2014; no seizure since then   Stroke [facility])      Surgical History:  Past Surgical History:  Procedure Laterality Date   A-FLUTTER ABLATION N/A 06/23/2022   Procedure: A-FLUTTER ABLATION;  Surgeon: Maurice Small, MD;  Location: [facility] INVASIVE CV LAB;  Service: Cardiovascular;  Laterality: N/A;   A/V FISTULAGRAM Left 04/28/2022   Procedure: A/V Fistulagram;  Surgeon: Chuck Hint, MD;  Location: [facility] INVASIVE CV LAB;  Service: Cardiovascular;  Laterality: Left;   AV FISTULA PLACEMENT Left 01/31/2022   Procedure: LEFT ARM ARTERIOVENOUS (AV)  FISTULA CREATION;  Surgeon: Maeola Harman, MD;  Location: [facility] OR;  Service: Vascular;  Laterality: Left;  regional block to left arm   CARDIOVERSION N/A 03/24/2022   Procedure: CARDIOVERSION;  Surgeon: Dorthula Nettles, DO;  Location: [facility] ENDOSCOPY;  Service: Cardiovascular;  Laterality: N/A;   COLONOSCOPY W/ POLYPECTOMY  2003   no F/U (SOC discussed 12/03/12)   INGUINAL HERNIA REPAIR Left 06/16/2021   Procedure: LAPAROSCOPIC,  POSSIBLY OPEN LEFT INGUINAL HERNIA REPAIR WITH MESH;  Surgeon: Quentin Ore, MD;  Location: [facility] ORS;  Service: General;  Laterality: Left;   INSERTION OF MESH N/A 07/16/2017   Procedure: INSERTION OF MESH;  Surgeon: Berna Bue, MD;  Location: [facility] OR;  Service: General;  Laterality: N/A;   PEG PLACEMENT  2014   10/08/12-12/10/12   PEG TUBE REMOVAL  2014   PERIPHERAL VASCULAR BALLOON ANGIOPLASTY  04/28/2022   Procedure: PERIPHERAL VASCULAR BALLOON ANGIOPLASTY;  Surgeon: Chuck Hint, MD;  Location: [facility] INVASIVE CV LAB;  Service: Cardiovascular;;   PROSTATE BIOPSY     RIGHT HEART CATH N/A 03/24/2022   Procedure: RIGHT HEART CATH;  Surgeon: Dorthula Nettles, DO;  Location: [facility] INVASIVE CV LAB;  Service: Cardiovascular;  Laterality: N/A;   RIGHT/LEFT HEART CATH AND CORONARY ANGIOGRAPHY N/A 09/20/2020   Procedure: RIGHT/LEFT HEART CATH AND CORONARY ANGIOGRAPHY;  Surgeon: Marykay Lex, MD;  Location: [facility] INVASIVE CV LAB;  Service: Cardiovascular;  Laterality: N/A;   SUBQ ICD IMPLANT N/A 09/04/2022   Procedure: SUBQ ICD IMPLANT;  Surgeon: Maurice Small, MD;  Location: [facility] INVASIVE CV LAB;  Service: Cardiovascular;  Laterality: N/A;   TEE WITHOUT CARDIOVERSION N/A 09/27/2020   Procedure: TRANSESOPHAGEAL ECHOCARDIOGRAM (TEE);  Surgeon: Linden Dolin, MD;  Location: [facility] OR;  Service: Thoracic;  Laterality: N/A;   TEE WITHOUT CARDIOVERSION N/A 03/24/2022   Procedure: TRANSESOPHAGEAL ECHOCARDIOGRAM (TEE);  Surgeon: Dorthula Nettles, DO;  Location: [facility] ENDOSCOPY;  Service: Cardiovascular;  Laterality: N/A;   TEE WITHOUT CARDIOVERSION N/A 06/23/2022   Procedure: TRANSESOPHAGEAL ECHOCARDIOGRAM (TEE);  Surgeon: Maurice Small, MD;  Location: [facility] INVASIVE CV LAB;  Service: Cardiovascular;  Laterality: N/A;   TRACHEOSTOMY  2014   09/30/12-10/20/12   VENTRAL HERNIA REPAIR N/A 07/16/2017   Procedure: LAPAROSCOPIC VENTRAL HERNIA REPAIR WITH MESH;  Surgeon: Berna Bue, MD;   Location: [facility] OR;  Service: General;  Laterality: N/A;   wrist aspiration  02/16/2012    monosodium urate crystals; Dr Melvyn Novas     No medications prior to admission.    Inpatient Medications:   Allergies:  Allergies  Allergen Reactions   Hydrochlorothiazide Other (See Comments)    Gout , uncontrolled diabetes and renal insufficiency    Family History  Problem Relation Age of Onset   Heart failure Mother    Hypertension Mother    Diabetes Mother    Hypertension Father    Prostate cancer Father    Diabetes Father    Cancer Father    Heart failure Brother    Stroke Neg Hx    Heart disease Neg Hx    Colon cancer Neg Hx      Physical Exam: VS:  BP (!) 88/58 (BP Location: Right Arm, Patient Position: Sitting, Cuff Size: Normal)   Pulse (!) 36   Ht 5\' 6"  (1.676 m)   Wt 165 lb 3.2 oz (74.9 kg)   SpO2 96%   BMI 26.66 kg/m         Wt Readings from Last 3 Encounters:  12/25/22 165  lb 3.2 oz (74.9 kg)  09/28/22 162 lb 3.2 oz (73.6 kg)  09/27/22 163 lb (73.9 kg)    GEN- NAD, A&O x 3, normal affect HEENT: Normocephalic, atraumatic Lungs- CTAB, Normal effort.  Heart- Regular rate and rhythm, No M/G/R.  GI- Soft, NT, ND.  Extremities- No clubbing, cyanosis, or edema   Radiology/Studies: CUP PACEART REMOTE DEVICE CHECK  Result Date: 12/05/2022 Scheduled remote reviewed. Normal device function.  Presenting rhythm AF.  Known history of AF, on Eliquis. Next remote 91 days. - CS, CVRSSensing Configuration: Alternate Gain Setting: 1X Post Shock Pacing: ON   EKG: 8/12 shows AFL with slow V rates at 36 bpm(personally reviewed)  TELEMETRY: *** (personally reviewed)  DEVICE HISTORY: [place] S-ICD 08/2022 for CHF despite GDMT  Assessment/Plan:  Atrial flutter and fibrillation:  Pt has multiple flutter circuits not amenable to ablation. ECGs dating back to March 2024 show persistent atrial flutter   Bradycardia:  Patient was decreased AV conduction in the setting of amyloid.    He has denied symptoms in the past, but now with a heart rate of 36 we should not delay pacemaker placement any longer.  I am going to admit him directly to the hospital to expedite device placement. He should be n.p.o. after midnight tonight for possible device placement tomorrow   Secondary hypercoagulable state:  hold apixaban for possible pacemaker placement   CHFrEF:  EF has remained severely depressed at < 35% despite GDMT for over 3 months.   SICD was placed due to ESRD and presence of left-sided AV fistula Betablocker held due to severe LVH with restrictive cardiomyopathy   cardiac amyloid. With slow AV conduction   ESRD with fistula: left-side fistula.   For questions or updates, please contact [facility] Please consult [email or web address] for contact info under Cardiology/STEMI.  Dustin Flock, PA-C  12/25/2022 3:37 PM

## 2022-12-25 NOTE — Patient Instructions (Addendum)
Medication Instructions:  Your physician recommends that you continue on your current medications as directed. Please refer to the Current Medication list given to you today. *If you need a refill on your cardiac medications before your next appointment, please call your pharmacy*   Testing/Procedures: Direct Admit to Hospital - The hospital will contact you at [phone] when your bed is available.   Follow-Up: At [facility], you and your health needs are our priority.  As part of our continuing mission to provide you with exceptional heart care, we have created designated Provider Care Teams.  These Care Teams include your primary Cardiologist (physician) and Advanced Practice Providers (APPs -  Physician Assistants and Nurse Practitioners) who all work together to provide you with the care you need, when you need it.  We recommend signing up for the patient portal called "MyChart".  Sign up information is provided on this After Visit Summary.  MyChart is used to connect with patients for Virtual Visits (Telemedicine).  Patients are able to view lab/test results, encounter notes, upcoming appointments, etc.  Non-urgent messages can be sent to your provider as well.   To learn more about what you can do with MyChart, go to [email or web address].    Your next appointment:   We will schedule follow up after your hospital stay  Provider:   York Pellant, MD

## 2022-12-25 NOTE — Progress Notes (Signed)
Electrophysiology Office Note:    Date:  12/25/2022   ID:  XUE OVERTON, DOB [birth year], MRN [id number]  PCP:  Pincus Sanes, MD    [facility]Care Providers Cardiologist:  Donato Schultz, MD Electrophysiologist:  Maurice Small, MD     Referring MD: Pricilla Riffle, MD   History of Present Illness:    Corey [name] is a 77 y.o. male with a hx detailed below, significant for ATTR cardiac amyloid, pericardial effusion status post window, CHFrEF, CAD ESRD approaching dialysis, and atrial fibrillation and flutter referred for arrhythmia management.      In 2022, [name] presented to [facility] with DOE/angina. LHC at that time demonstrated 80% occlusion of the LAD, however, PCI was deferred due to markedly elevated LVEDP. LVEF by echo at that time of 35%-40%. Due to a large pericardial effusion, he had a window done with biopsies at that time compatible with amyloidosis. AL evaluation was negative. PYP was equivocal. Genetic testing consistent with Val142Ile TTR.   He was admitted in early November 2023 with volume overload and an atrial fibrillation occasionally organizing into flutter. He was diuresed, underwent TEE/DCCV x 2. When in sinus rhythm, he was often bradycardic with sinus brady and Mobitz I AV block. I saw him a few times during this admission and recommended placement of a holter monitor on discharge to assess chronotropic competence.  He underwent EP study and ablation for atrial flutter in March, 2024. He had multiple arrhythmias -- multiple flutters involving variable circuits as well as a transient focal tachycardia. I performed a CTI ablation as at least one of the flutters involved the cavo-tricuspid isthmus. I elected not to pursue all the multiple flutter circuits. Today he has an atypical atrial flutter. He continues to have shortness of breath and fatigue but no palpitations.  Due to the presence of a left-sided AV fistula and impending dialysis, I opted to place  a subcutaneous ICD for primary prevention of SCD. At the time, he had intermittent bradycardia but denied having symptoms.  His bradycardia has progressed. ECG 5/15 shows a severely prolonged AV block. Today, he is in flutter again with slow ventricular rates (36 bpm).      The patient reports that he is feeling well. He feels dizzy at times.   EKGs/Labs/Other Studies Reviewed Today:    TTE 03/23/2022 Speckled appearance consistent with amyloid Global hypokinesis, EF 30-35%, severe LVH LA and RA are moderately dilated  EKG:  Last EKG results: today - atrial flutter, atypical   Recent Labs: 01/05/2022: ALT 31; Pro B Natriuretic peptide (BNP) 1,740.0 03/22/2022: B Natriuretic Peptide 1,571.3 08/21/2022: BUN 65; Creatinine, Ser 2.97; Hemoglobin 11.3; Platelets 174; Potassium 4.4; Sodium 143     Physical Exam:    VS:  BP (!) 88/58 (BP Location: Right Arm, Patient Position: Sitting, Cuff Size: Normal)   Pulse (!) 36   Ht 5\' 6"  (1.676 m)   Wt 165 lb 3.2 oz (74.9 kg)   SpO2 96%   BMI 26.66 kg/m     Wt Readings from Last 3 Encounters:  12/25/22 165 lb 3.2 oz (74.9 kg)  09/28/22 162 lb 3.2 oz (73.6 kg)  09/27/22 163 lb (73.9 kg)     GEN:  Well nourished, well developed in no acute distress CARDIAC: RRR, no murmurs, rubs, gallops RESPIRATORY:  Normal work of breathing MUSCULOSKELETAL: no edema    ASSESSMENT & PLAN:    Atrial flutter and fibrillation:  Pt has multiple flutter circuits not  amenable to ablation. ECGs dating back to March 2024 show persistent atrial flutter  Bradycardia:  Patient was decreased AV conduction in the setting of amyloid.   He has denied symptoms in the past, but now with a heart rate of 36 we should not delay pacemaker placement any longer.  I am going to admit him directly to the hospital to expedite device placement. He should be n.p.o. after midnight tonight for possible device placement tomorrow  Secondary hypercoagulable state:  hold  apixaban for possible pacemaker placement  CHFrEF:  EF has remained severely depressed at < 35% despite GDMT for over 3 months.   SICD was placed due to ESRD and presence of left-sided AV fistula Betablocker held due to severe LVH with restrictive cardiomyopathy  cardiac amyloid. With slow AV conduction  ESRD with fistula: left-side fistula.        Medication Adjustments/Labs and Tests Ordered: Current medicines are reviewed at length with the patient today.  Concerns regarding medicines are outlined above.  Orders Placed This Encounter  Procedures   LONG TERM MONITOR-LIVE TELEMETRY (3-14 DAYS)   EKG 12-Lead   No orders of the defined types were placed in this encounter.    Signed, Maurice Small, MD  12/25/2022 11:40 AM    [facility]

## 2022-12-25 NOTE — Telephone Encounter (Signed)
Requested Prescriptions   Signed Prescriptions Disp Refills   glucose blood (ACCU-CHEK AVIVA PLUS) test strip 400 strip 2    Sig: Use to check blood glucose 4 times a day.    Authorizing Provider: Reather Littler    Ordering User: Pollie Meyer

## 2022-12-26 ENCOUNTER — Inpatient Hospital Stay (HOSPITAL_COMMUNITY)
Admission: AD | Admit: 2022-12-26 | Discharge: 2022-12-29 | DRG: 242 | Disposition: A | Discharge status: Home / Self Care | Payer: Medicare HMO | Attending: Cardiovascular Disease | Admitting: Cardiovascular Disease

## 2022-12-26 ENCOUNTER — Other Ambulatory Visit: Payer: Self-pay

## 2022-12-26 ENCOUNTER — Encounter (HOSPITAL_COMMUNITY)
Admission: AD | Disposition: A | Discharge status: Home / Self Care | Payer: Self-pay | Source: Home / Self Care | Attending: Cardiovascular Disease

## 2022-12-26 ENCOUNTER — Inpatient Hospital Stay (HOSPITAL_COMMUNITY): Payer: Medicare HMO

## 2022-12-26 ENCOUNTER — Encounter (HOSPITAL_COMMUNITY): Payer: Self-pay | Admitting: Cardiovascular Disease

## 2022-12-26 DIAGNOSIS — Z7901 Long term (current) use of anticoagulants: Secondary | ICD-10-CM

## 2022-12-26 DIAGNOSIS — I4891 Unspecified atrial fibrillation: Secondary | ICD-10-CM | POA: Diagnosis present

## 2022-12-26 DIAGNOSIS — Z8673 Personal history of transient ischemic attack (TIA), and cerebral infarction without residual deficits: Secondary | ICD-10-CM

## 2022-12-26 DIAGNOSIS — Z7984 Long term (current) use of oral hypoglycemic drugs: Secondary | ICD-10-CM

## 2022-12-26 DIAGNOSIS — I252 Old myocardial infarction: Secondary | ICD-10-CM | POA: Diagnosis not present

## 2022-12-26 DIAGNOSIS — Z8042 Family history of malignant neoplasm of prostate: Secondary | ICD-10-CM

## 2022-12-26 DIAGNOSIS — G4733 Obstructive sleep apnea (adult) (pediatric): Secondary | ICD-10-CM | POA: Diagnosis present

## 2022-12-26 DIAGNOSIS — R0602 Shortness of breath: Secondary | ICD-10-CM | POA: Diagnosis not present

## 2022-12-26 DIAGNOSIS — D6869 Other thrombophilia: Secondary | ICD-10-CM | POA: Diagnosis not present

## 2022-12-26 DIAGNOSIS — G40909 Epilepsy, unspecified, not intractable, without status epilepticus: Secondary | ICD-10-CM | POA: Diagnosis present

## 2022-12-26 DIAGNOSIS — M109 Gout, unspecified: Secondary | ICD-10-CM | POA: Diagnosis present

## 2022-12-26 DIAGNOSIS — Z8674 Personal history of sudden cardiac arrest: Secondary | ICD-10-CM

## 2022-12-26 DIAGNOSIS — Z95810 Presence of automatic (implantable) cardiac defibrillator: Secondary | ICD-10-CM | POA: Diagnosis not present

## 2022-12-26 DIAGNOSIS — I4892 Unspecified atrial flutter: Secondary | ICD-10-CM | POA: Diagnosis not present

## 2022-12-26 DIAGNOSIS — I5043 Acute on chronic combined systolic (congestive) and diastolic (congestive) heart failure: Secondary | ICD-10-CM | POA: Diagnosis not present

## 2022-12-26 DIAGNOSIS — R001 Bradycardia, unspecified: Secondary | ICD-10-CM | POA: Diagnosis not present

## 2022-12-26 DIAGNOSIS — J9621 Acute and chronic respiratory failure with hypoxia: Secondary | ICD-10-CM | POA: Diagnosis not present

## 2022-12-26 DIAGNOSIS — I441 Atrioventricular block, second degree: Secondary | ICD-10-CM | POA: Diagnosis present

## 2022-12-26 DIAGNOSIS — I2510 Atherosclerotic heart disease of native coronary artery without angina pectoris: Secondary | ICD-10-CM | POA: Diagnosis present

## 2022-12-26 DIAGNOSIS — R918 Other nonspecific abnormal finding of lung field: Secondary | ICD-10-CM | POA: Diagnosis not present

## 2022-12-26 DIAGNOSIS — E1122 Type 2 diabetes mellitus with diabetic chronic kidney disease: Secondary | ICD-10-CM | POA: Diagnosis present

## 2022-12-26 DIAGNOSIS — R0989 Other specified symptoms and signs involving the circulatory and respiratory systems: Secondary | ICD-10-CM | POA: Diagnosis not present

## 2022-12-26 DIAGNOSIS — I425 Other restrictive cardiomyopathy: Secondary | ICD-10-CM | POA: Diagnosis present

## 2022-12-26 DIAGNOSIS — Z87891 Personal history of nicotine dependence: Secondary | ICD-10-CM | POA: Diagnosis not present

## 2022-12-26 DIAGNOSIS — E854 Organ-limited amyloidosis: Secondary | ICD-10-CM | POA: Diagnosis present

## 2022-12-26 DIAGNOSIS — Z950 Presence of cardiac pacemaker: Secondary | ICD-10-CM | POA: Diagnosis not present

## 2022-12-26 DIAGNOSIS — Z8249 Family history of ischemic heart disease and other diseases of the circulatory system: Secondary | ICD-10-CM | POA: Diagnosis not present

## 2022-12-26 DIAGNOSIS — Z8546 Personal history of malignant neoplasm of prostate: Secondary | ICD-10-CM | POA: Diagnosis not present

## 2022-12-26 DIAGNOSIS — I5041 Acute combined systolic (congestive) and diastolic (congestive) heart failure: Secondary | ICD-10-CM

## 2022-12-26 DIAGNOSIS — I130 Hypertensive heart and chronic kidney disease with heart failure and stage 1 through stage 4 chronic kidney disease, or unspecified chronic kidney disease: Secondary | ICD-10-CM | POA: Diagnosis not present

## 2022-12-26 DIAGNOSIS — J9601 Acute respiratory failure with hypoxia: Secondary | ICD-10-CM | POA: Diagnosis not present

## 2022-12-26 DIAGNOSIS — I484 Atypical atrial flutter: Secondary | ICD-10-CM | POA: Diagnosis not present

## 2022-12-26 DIAGNOSIS — Z4682 Encounter for fitting and adjustment of non-vascular catheter: Secondary | ICD-10-CM | POA: Diagnosis not present

## 2022-12-26 DIAGNOSIS — N184 Chronic kidney disease, stage 4 (severe): Secondary | ICD-10-CM | POA: Diagnosis not present

## 2022-12-26 DIAGNOSIS — Z8719 Personal history of other diseases of the digestive system: Secondary | ICD-10-CM

## 2022-12-26 DIAGNOSIS — Z79899 Other long term (current) drug therapy: Secondary | ICD-10-CM

## 2022-12-26 DIAGNOSIS — I5042 Chronic combined systolic (congestive) and diastolic (congestive) heart failure: Secondary | ICD-10-CM | POA: Diagnosis not present

## 2022-12-26 DIAGNOSIS — E1165 Type 2 diabetes mellitus with hyperglycemia: Secondary | ICD-10-CM | POA: Diagnosis present

## 2022-12-26 LAB — CBC WITH DIFFERENTIAL/PLATELET
Abs Immature Granulocytes: 0.02 10*3/uL (ref 0.00–0.07)
Basophils Absolute: 0 10*3/uL (ref 0.0–0.1)
Basophils Relative: 1 %
Eosinophils Absolute: 0.1 10*3/uL (ref 0.0–0.5)
Eosinophils Relative: 4 %
HCT: 47 % (ref 39.0–52.0)
Hemoglobin: 14.3 g/dL (ref 13.0–17.0)
Immature Granulocytes: 1 %
Lymphocytes Relative: 35 %
Lymphs Abs: 1 10*3/uL (ref 0.7–4.0)
MCH: 25.4 pg — ABNORMAL LOW (ref 26.0–34.0)
MCHC: 30.4 g/dL (ref 30.0–36.0)
MCV: 83.3 fL (ref 80.0–100.0)
Monocytes Absolute: 0.6 10*3/uL (ref 0.1–1.0)
Monocytes Relative: 22 %
Neutro Abs: 1 10*3/uL — ABNORMAL LOW (ref 1.7–7.7)
Neutrophils Relative %: 37 %
Platelets: 159 10*3/uL (ref 150–400)
RBC: 5.64 MIL/uL (ref 4.22–5.81)
RDW: 22.4 % — ABNORMAL HIGH (ref 11.5–15.5)
WBC: 2.8 10*3/uL — ABNORMAL LOW (ref 4.0–10.5)
nRBC: 0 % (ref 0.0–0.2)

## 2022-12-26 LAB — BASIC METABOLIC PANEL
Anion gap: 14 (ref 5–15)
BUN: 56 mg/dL — ABNORMAL HIGH (ref 8–23)
CO2: 20 mmol/L — ABNORMAL LOW (ref 22–32)
Calcium: 8.9 mg/dL (ref 8.9–10.3)
Chloride: 108 mmol/L (ref 98–111)
Creatinine, Ser: 2.76 mg/dL — ABNORMAL HIGH (ref 0.61–1.24)
GFR, Estimated: 23 mL/min — ABNORMAL LOW (ref >60)
Glucose, Bld: 214 mg/dL — ABNORMAL HIGH (ref 70–99)
Potassium: 3.4 mmol/L — ABNORMAL LOW (ref 3.5–5.1)
Sodium: 142 mmol/L (ref 135–145)

## 2022-12-26 LAB — GLUCOSE, CAPILLARY
Glucose-Capillary: 63 mg/dL — ABNORMAL LOW (ref 70–99)
Glucose-Capillary: 61 mg/dL — ABNORMAL LOW (ref 70–99)
Glucose-Capillary: 179 mg/dL — ABNORMAL HIGH (ref 70–99)
Glucose-Capillary: 104 mg/dL — ABNORMAL HIGH (ref 70–99)
Glucose-Capillary: 107 mg/dL — ABNORMAL HIGH (ref 70–99)
Glucose-Capillary: 108 mg/dL — ABNORMAL HIGH (ref 70–99)

## 2022-12-26 LAB — SURGICAL PCR SCREEN
MRSA, PCR: NEGATIVE
Staphylococcus aureus: NEGATIVE

## 2022-12-26 SURGERY — INVASIVE LAB ABORTED CASE

## 2022-12-26 MED ORDER — FUROSEMIDE 10 MG/ML IJ SOLN
40.0000 mg | Freq: Two times a day (BID) | INTRAMUSCULAR | Status: DC
  Administered 2022-12-26: 40 mg via INTRAVENOUS

## 2022-12-26 MED ORDER — FUROSEMIDE 10 MG/ML IJ SOLN
INTRAMUSCULAR | Status: AC
  Filled 2022-12-26: qty 4

## 2022-12-26 MED ORDER — SODIUM CHLORIDE 0.9 % IV SOLN: 80.0000 mg | INTRAVENOUS | Status: DC

## 2022-12-26 MED ORDER — DEXTROSE 50 % IV SOLN: 50.0000 mL | Freq: Once | INTRAVENOUS | Status: AC

## 2022-12-26 MED ORDER — SODIUM CHLORIDE 0.9 % IV SOLN: INTRAVENOUS | Status: DC

## 2022-12-26 MED ORDER — ALBUTEROL SULFATE (2.5 MG/3ML) 0.083% IN NEBU: 3.0000 mL | INHALATION_SOLUTION | RESPIRATORY_TRACT | Status: DC | PRN

## 2022-12-26 MED ORDER — TAFAMIDIS 61 MG PO CAPS: 61.0000 mg | ORAL_CAPSULE | Freq: Every day | ORAL | Status: DC

## 2022-12-26 MED ORDER — ALLOPURINOL 100 MG PO TABS
50.0000 mg | ORAL_TABLET | Freq: Every morning | ORAL | Status: DC
  Administered 2022-12-27 – 2022-12-29 (×3): 50 mg via ORAL
  Filled 2022-12-26 (×3): qty 1

## 2022-12-26 MED ORDER — HYDRALAZINE HCL 25 MG PO TABS
25.0000 mg | ORAL_TABLET | Freq: Three times a day (TID) | ORAL | Status: DC
  Administered 2022-12-26 – 2022-12-29 (×7): 25 mg via ORAL
  Filled 2022-12-26 (×7): qty 1

## 2022-12-26 MED ORDER — SODIUM BICARBONATE 650 MG PO TABS
650.0000 mg | ORAL_TABLET | Freq: Every day | ORAL | Status: DC
  Administered 2022-12-26 – 2022-12-29 (×4): 650 mg via ORAL
  Filled 2022-12-26 (×4): qty 1

## 2022-12-26 MED ORDER — CHLORHEXIDINE GLUCONATE 4 % EX SOLN: 60.0000 mL | Freq: Once | CUTANEOUS | Status: DC

## 2022-12-26 MED ORDER — CEFAZOLIN SODIUM-DEXTROSE 2-4 GM/100ML-% IV SOLN: 2.0000 g | INTRAVENOUS | Status: DC

## 2022-12-26 MED ORDER — ORAL CARE MOUTH RINSE: 15.0000 mL | OROMUCOSAL | Status: DC | PRN

## 2022-12-26 MED ORDER — CHLORHEXIDINE GLUCONATE CLOTH 2 % EX PADS
6.0000 | MEDICATED_PAD | Freq: Every day | CUTANEOUS | Status: DC
  Administered 2022-12-26 – 2022-12-29 (×4): 6 via TOPICAL

## 2022-12-26 MED ORDER — DEXTROSE 50 % IV SOLN
12.5000 g | INTRAVENOUS | Status: AC
  Administered 2022-12-26: 12.5 g via INTRAVENOUS

## 2022-12-26 MED ORDER — CEFAZOLIN SODIUM-DEXTROSE 2-4 GM/100ML-% IV SOLN
INTRAVENOUS | Status: AC
  Filled 2022-12-26: qty 100

## 2022-12-26 MED ORDER — CEFAZOLIN SODIUM-DEXTROSE 2-4 GM/100ML-% IV SOLN
2.0000 g | INTRAVENOUS | Status: AC
  Administered 2022-12-26: 2 g via INTRAVENOUS

## 2022-12-26 MED ORDER — DEXTROSE 50 % IV SOLN
INTRAVENOUS | Status: AC
  Administered 2022-12-26: 50 mL via INTRAVENOUS
  Filled 2022-12-26: qty 50

## 2022-12-26 MED ORDER — SODIUM CHLORIDE 0.9 % IV SOLN
INTRAVENOUS | Status: AC
  Filled 2022-12-26: qty 2

## 2022-12-26 MED ORDER — POTASSIUM CHLORIDE CRYS ER 20 MEQ PO TBCR
20.0000 meq | EXTENDED_RELEASE_TABLET | Freq: Once | ORAL | Status: AC
  Administered 2022-12-26: 20 meq via ORAL
  Filled 2022-12-26: qty 1

## 2022-12-26 MED ORDER — ACETAMINOPHEN 500 MG PO TABS
1000.0000 mg | ORAL_TABLET | Freq: Four times a day (QID) | ORAL | Status: DC | PRN
  Administered 2022-12-28: 1000 mg via ORAL
  Filled 2022-12-26: qty 2

## 2022-12-26 MED ORDER — DEXTROSE 50 % IV SOLN
INTRAVENOUS | Status: AC
  Filled 2022-12-26: qty 50

## 2022-12-26 SURGICAL SUPPLY — 5 items
CABLE SURGICAL S-101-97-12 (CABLE) ×3 IMPLANT
KIT MICROPUNCTURE NIT STIFF (SHEATH) IMPLANT
PAD DEFIB RADIO PHYSIO CONN (PAD) ×3 IMPLANT
SHEATH 7FR PRELUDE SNAP 13 (SHEATH) IMPLANT
TRAY PACEMAKER INSERTION (PACKS) ×3 IMPLANT

## 2022-12-26 NOTE — Consult Note (Signed)
NAME:  Corey [name], MRN:  [id number], DOB:  [birth year], LOS: 0 ADMISSION DATE:  12/26/2022, CONSULTATION DATE:  12/26/22 REFERRING MD:  Dr. Nelly Laurence, CHIEF COMPLAINT:  Acute hypoxic resp failure   History of Present Illness:  77 yo male with pmh TTR Amyloid AM, HFrEF, Aflutter, CAD, sleep apnea who presented to [facility] after being recommended to present for ppm. He reportedly presented to EP clinic 8/12 for routine follow up and was noted to have atypical aflutter with slow ventricular response (rate 36bpm yesterday). He appeared dizzy per cardiology note and significant other reported him being more "wobbly". He was to be direct admitted but chart states he was never called for bed so he was brought in for outpt ppm.   Per chart from Dr Nelly Laurence " Today, he did appear to be in his usual chronically ill state.  We brought him to the lab for pacemaker placement, but upon lying him flat, he became hypoxic with increased respiratory effort.  His oxygen saturations were in the 80s on nonrebreather.  We canceled the case and are presently admitting him for management of hypoxic respiratory failure.  Throughout the process, he reports that he was feeling fine. "  He is being admitted to Icu on 2L [place], stable bp, hr. Ccm has been asked to follow along. At this time pt has no complaints. Eating dinner on 2L [place] sating 100%.   Pertinent  Medical History  As above  Significant Hospital Events: Including procedures, antibiotic start and stop dates in addition to other pertinent events   Admitted to ICU after hypoxic resp insuff intra procedural for ppm  Interim History / Subjective:  As above  Objective   Blood pressure (!) 153/98, pulse 64, temperature 97.7 F (36.5 C), temperature source Temporal, resp. rate 19, height 5\' 6"  (1.676 m), weight 74.8 kg, SpO2 96%.       No intake or output data in the 24 hours ending 12/26/22 1817 Filed Weights   12/26/22 1314  Weight: 74.8 kg    Examination: General:  sitting up eating dinner, no acute distress HENT: ncat, eomi, perlla Lungs: bibasilar rales, otherwise diminished b/l Cardiovascular: irreg Abdomen: soft, nt,nd bs+ Extremities: mild edema in b/l le, otherwise no clubbing or cyanosis Neuro: no focal deficits GU: deferred  Resolved Hospital Problem list     Assessment & Plan:  Acute hypoxic resp failure -on 2L [place] titrate oxygen for sat >98%  Acute exacerbation of HFrEF:  Aflutter with slow ventricular response:  CAD -TTR amyloidosis -chronic EF 30-35% -worsening when ppm placement procedure being undertaken -cards has given diuretic -all other recs per cards -baseline 40-50's  CKD 4-5:  -monitor renal function -has L av fistula for dialysis when necessary  T2dm with hyperglycemia:  -noted -last A1c 7 -cont ssi  Best Practice (right click and "Reselect all SmartList Selections" daily)   Diet/type: Regular consistency (see orders) DVT prophylaxis: SCD GI prophylaxis: N/A   Labs   CBC: Recent Labs  Lab 12/26/22 1254  WBC 2.8*  NEUTROABS 1.0*  HGB 14.3  HCT 47.0  MCV 83.3  PLT 159    Basic Metabolic Panel: Recent Labs  Lab 12/26/22 1346  NA 142  K 3.4*  CL 108  CO2 20*  GLUCOSE 214*  BUN 56*  CREATININE 2.76*  CALCIUM 8.9   GFR: Estimated Creatinine Clearance: 21.2 mL/min (A) (by C-G formula based on SCr of 2.76 mg/dL (H)). Recent Labs  Lab 12/26/22 1254  WBC 2.8*  Liver Function Tests: No results for input(s): "AST", "ALT", "ALKPHOS", "BILITOT", "PROT", "ALBUMIN" in the last 168 hours. No results for input(s): "LIPASE", "AMYLASE" in the last 168 hours. No results for input(s): "AMMONIA" in the last 168 hours.  ABG    Component Value Date/Time   PHART 7.341 (L) 09/20/2020 1337   PCO2ART 37.9 09/20/2020 1337   PO2ART 96 09/20/2020 1337   HCO3 21.3 03/24/2022 1428   HCO3 21.9 03/24/2022 1428   TCO2 26 04/28/2022 0849   ACIDBASEDEF 4.0 (H) 03/24/2022 1428   ACIDBASEDEF 3.0 (H)  03/24/2022 1428   O2SAT 66 03/24/2022 1428   O2SAT 65 03/24/2022 1428     Coagulation Profile: No results for input(s): "INR", "PROTIME" in the last 168 hours.  Cardiac Enzymes: No results for input(s): "CKTOTAL", "CKMB", "CKMBINDEX", "TROPONINI" in the last 168 hours.  HbA1C: Hgb A1c MFr Bld  Date/Time Value Ref Range Status  09/19/2022 10:03 AM 7.0 (H) 4.6 - 6.5 % Final    Comment:    Glycemic Control Guidelines for People with Diabetes:Non Diabetic:  <6%Goal of Therapy: <7%Additional Action Suggested:  >8%   05/11/2022 10:01 AM 6.0 4.6 - 6.5 % Final    Comment:    Glycemic Control Guidelines for People with Diabetes:Non Diabetic:  <6%Goal of Therapy: <7%Additional Action Suggested:  >8%     CBG: Recent Labs  Lab 12/26/22 1458 12/26/22 1550 12/26/22 1645 12/26/22 1802 12/26/22 1804  GLUCAP 61* 179* 104* 108* 107*    Review of Systems:   As per HPI  Past Medical History:  He,  has a past medical history of Cataract, CHF (congestive heart failure) (HCC), Chronic heart failure with preserved ejection fraction (HFpEF) (HCC), Colon polyp (2003), Coronary artery disease, CVA (cerebral infarction) (2014), Diabetes mellitus, Diverticulosis (2003), Dyspnea, Gout, Hereditary cardiac amyloidosis (HCC), Hypertension, Myocardial infarction (HCC) (09/19/2012), OSA (obstructive sleep apnea) (03/27/2018), Pneumonia (09/29/2011), Prostate cancer (HCC), Renal insufficiency, Seizures (HCC) (09/19/2012), and Stroke (HCC).   Surgical History:   Past Surgical History:  Procedure Laterality Date   A-FLUTTER ABLATION N/A 06/23/2022   Procedure: A-FLUTTER ABLATION;  Surgeon: Mealor, Roberts Gaudy, MD;  Location: [facility] INVASIVE CV LAB;  Service: Cardiovascular;  Laterality: N/A;   A/V FISTULAGRAM Left 04/28/2022   Procedure: A/V Fistulagram;  Surgeon: Chuck Hint, MD;  Location: [facility] INVASIVE CV LAB;  Service: Cardiovascular;  Laterality: Left;   AV FISTULA PLACEMENT Left 01/31/2022    Procedure: LEFT ARM ARTERIOVENOUS (AV) FISTULA CREATION;  Surgeon: Maeola Harman, MD;  Location: [facility] OR;  Service: Vascular;  Laterality: Left;  regional block to left arm   CARDIOVERSION N/A 03/24/2022   Procedure: CARDIOVERSION;  Surgeon: Dorthula Nettles, DO;  Location: [facility] ENDOSCOPY;  Service: Cardiovascular;  Laterality: N/A;   COLONOSCOPY W/ POLYPECTOMY  2003   no F/U (SOC discussed 12/03/12)   INGUINAL HERNIA REPAIR Left 06/16/2021   Procedure: LAPAROSCOPIC, POSSIBLY OPEN LEFT INGUINAL HERNIA REPAIR WITH MESH;  Surgeon: Quentin Ore, MD;  Location: [facility] ORS;  Service: General;  Laterality: Left;   INSERTION OF MESH N/A 07/16/2017   Procedure: INSERTION OF MESH;  Surgeon: Berna Bue, MD;  Location: [facility] OR;  Service: General;  Laterality: N/A;   PEG PLACEMENT  2014   10/08/12-12/10/12   PEG TUBE REMOVAL  2014   PERIPHERAL VASCULAR BALLOON ANGIOPLASTY  04/28/2022   Procedure: PERIPHERAL VASCULAR BALLOON ANGIOPLASTY;  Surgeon: Chuck Hint, MD;  Location: [facility] INVASIVE CV LAB;  Service: Cardiovascular;;   PROSTATE BIOPSY  RIGHT HEART CATH N/A 03/24/2022   Procedure: RIGHT HEART CATH;  Surgeon: Dorthula Nettles, DO;  Location: [facility] INVASIVE CV LAB;  Service: Cardiovascular;  Laterality: N/A;   RIGHT/LEFT HEART CATH AND CORONARY ANGIOGRAPHY N/A 09/20/2020   Procedure: RIGHT/LEFT HEART CATH AND CORONARY ANGIOGRAPHY;  Surgeon: Marykay Lex, MD;  Location: [facility] INVASIVE CV LAB;  Service: Cardiovascular;  Laterality: N/A;   SUBQ ICD IMPLANT N/A 09/04/2022   Procedure: SUBQ ICD IMPLANT;  Surgeon: Maurice Small, MD;  Location: [facility] INVASIVE CV LAB;  Service: Cardiovascular;  Laterality: N/A;   TEE WITHOUT CARDIOVERSION N/A 09/27/2020   Procedure: TRANSESOPHAGEAL ECHOCARDIOGRAM (TEE);  Surgeon: Linden Dolin, MD;  Location: [facility] OR;  Service: Thoracic;  Laterality: N/A;   TEE WITHOUT CARDIOVERSION N/A 03/24/2022   Procedure: TRANSESOPHAGEAL ECHOCARDIOGRAM (TEE);   Surgeon: Dorthula Nettles, DO;  Location: [facility] ENDOSCOPY;  Service: Cardiovascular;  Laterality: N/A;   TEE WITHOUT CARDIOVERSION N/A 06/23/2022   Procedure: TRANSESOPHAGEAL ECHOCARDIOGRAM (TEE);  Surgeon: Maurice Small, MD;  Location: [facility] INVASIVE CV LAB;  Service: Cardiovascular;  Laterality: N/A;   TRACHEOSTOMY  2014   09/30/12-10/20/12   VENTRAL HERNIA REPAIR N/A 07/16/2017   Procedure: LAPAROSCOPIC VENTRAL HERNIA REPAIR WITH MESH;  Surgeon: Berna Bue, MD;  Location: [facility] OR;  Service: General;  Laterality: N/A;   wrist aspiration  02/16/2012    monosodium urate crystals; Dr Melvyn Novas     Social History:   reports that he quit smoking about 54 years ago. His smoking use included cigarettes. He started smoking about 55 years ago. He has a 0.3 pack-year smoking history. He has never used smokeless tobacco. He reports that he does not drink alcohol and does not use drugs.   Family History:  His family history includes Cancer in his father; Diabetes in his father and mother; Heart failure in his brother and mother; Hypertension in his father and mother; Prostate cancer in his father. There is no history of Stroke, Heart disease, or Colon cancer.   Allergies Allergies  Allergen Reactions   Hydrochlorothiazide Other (See Comments)    Gout , uncontrolled diabetes and renal insufficiency     Home Medications  Prior to Admission medications   Medication Sig Start Date End Date Taking? Authorizing Provider  allopurinol (ZYLOPRIM) 100 MG tablet Take 50 mg by mouth in the morning.   Yes [provider]  apixaban (ELIQUIS) 5 MG TABS tablet Take 1 tablet (5 mg total) by mouth 2 (two) times daily. 09/27/22  Yes Jake Bathe, MD  cholecalciferol (VITAMIN D3) 25 MCG (1000 UNIT) tablet Take 1,000 Units by mouth daily.   Yes [provider]  hydrALAZINE (APRESOLINE) 25 MG tablet TAKE 1 TABLET THREE TIMES DAILY 12/07/22  Yes Burns, [name], MD  sitaGLIPtin (JANUVIA) 50 MG tablet  Take 1 tablet (50 mg total) by mouth daily. 09/28/22  Yes Reather Littler, MD  sodium bicarbonate 650 MG tablet TAKE 1 TABLET BY MOUTH TWICE DAILY . APPOINTMENT REQUIRED FOR FUTURE REFILLS 12/14/22  Yes Burns, [name], MD  Tafamidis [facility]) 61 MG CAPS Take 1 capsule (61 mg total) by mouth daily. 06/19/22  Yes Jake Bathe, MD  Torsemide 40 MG TABS Take 1 tablet by mouth daily. 09/27/22  Yes Jake Bathe, MD  acetaminophen (TYLENOL) 500 MG tablet Take 1,000 mg by mouth every 6 (six) hours as needed for moderate pain.    [provider]  albuterol (VENTOLIN HFA) 108 (90 Base) MCG/ACT inhaler Inhale 2 puffs into  the lungs every 4 (four) hours as needed for wheezing or shortness of breath. 11/14/21   White, Elita Boone, NP  bismuth subsalicylate (PEPTO BISMOL) 262 MG/15ML suspension Take 30 mLs by mouth every 6 (six) hours as needed for indigestion.    [provider]  colchicine (COLCRYS) 0.6 MG tablet Take 2 tabs once and then one tab one hour later as needed for gout 07/30/20   Pincus Sanes, MD  glucose blood (ACCU-CHEK AVIVA PLUS) test strip Use to check blood glucose 4 times a day. 12/25/22   Reather Littler, MD  metolazone (ZAROXOLYN) 5 MG tablet Take 1 tablet (5 mg total) by mouth once a week on the same day of the week. 09/27/22   Jake Bathe, MD  rosuvastatin (CRESTOR) 5 MG tablet Take 1 tablet (5 mg total) by mouth daily. Patient not taking: Reported on 12/25/2022 06/26/22   Mealor, Roberts Gaudy, MD  TRUEplus Lancets 33G MISC USE 4 (FOUR) TIMES DAILY 04/04/21   Reather Littler, MD     Critical care time: 

## 2022-12-26 NOTE — Progress Notes (Signed)
Patient's S-ICD is currently off and will remain off while patient is in the hospital per Dr Nelly Laurence.

## 2022-12-26 NOTE — H&P (Signed)
ELECTROPHYSIOLOGY H&P NOTE    Patient ID: Corey [name] MRN: [id number], DOB/AGE: November 11, 1945 77 y.o.  Admit date: 12/26/2022 Date of Consult: 12/26/2022  Primary Physician: Pincus Sanes, MD Primary Cardiologist: Bari Edward Electrophysiologist:   Patient Profile: Corey [name] is a 76 y.o. male with a history of TTR Amyloid cardiomyopathy, CHFrEF, atrial flutter with slow ventricular response who is being admitted for hypoxic respiratory failure and acute congestive heart failure.  HPI:  Corey [name] is a 77 y.o. male with a complex cardiac history significant for CHF with reduced EF (30 to 35%) with amyloidosis, history of coronary disease and MI, past PEA arrest in the setting of hypoxic respiratory failure, sleep apnea.  He presented to electrophysiology clinic yesterday for routine follow-up.  He has a persistent atypical atrial flutter with slow ventricular response, but his rate was particularly slow yesterday at 36 bpm, and he appeared visibly dizzy.  He reported that he felt at baseline, but his wife noticed that he seemed more "wobbly."  We made arrangements for direct admission, but he was not called for a bed yesterday, so he was brought in as an outpatient for pacemaker placement.  Today, he did appear to be in his usual chronically ill state.  We brought him to the lab for pacemaker placement, but upon lying him flat, he became hypoxic with increased respiratory effort.  His oxygen saturations were in the 80s on nonrebreather.  We canceled the case and are presently admitting him for management of hypoxic respiratory failure.  Throughout the process, he reports that he was feeling fine.    Past Medical History:  Diagnosis Date   Cataract    CHF (congestive heart failure) (HCC)    Chronic heart failure with preserved ejection fraction (HFpEF) (HCC)    Colon polyp 2003   Dr Victorino Dike; F/U was to be 2008( not completed)   Coronary artery disease    CVA  (cerebral infarction) 2014   Diabetes mellitus    Diverticulosis 2003   Dyspnea    with exertion   Gout    Hereditary cardiac amyloidosis (HCC)    Hypertension    Myocardial infarction (HCC) 09/19/2012   PEA cardiac arrest in setting of acute respiratory failure/pulmonary edema   OSA (obstructive sleep apnea) 03/27/2018   Pneumonia 09/29/2011   Avelox X 10 days as OP   Prostate cancer (HCC)    Renal insufficiency    Seizures (HCC) 09/19/2012   not treated for seizure disorder; had a seizure after stroke 2014; no seizure since then   Stroke [facility])       Home medications Medications Prior to Admission  Medication Sig Dispense Refill Last Dose   allopurinol (ZYLOPRIM) 100 MG tablet Take 50 mg by mouth in the morning.   12/26/2022   apixaban (ELIQUIS) 5 MG TABS tablet Take 1 tablet (5 mg total) by mouth 2 (two) times daily. 60 tablet 6 12/25/2022 at 1100   cholecalciferol (VITAMIN D3) 25 MCG (1000 UNIT) tablet Take 1,000 Units by mouth daily.   12/26/2022   hydrALAZINE (APRESOLINE) 25 MG tablet TAKE 1 TABLET THREE TIMES DAILY 90 tablet 11 12/26/2022   sitaGLIPtin (JANUVIA) 50 MG tablet Take 1 tablet (50 mg total) by mouth daily. 30 tablet 2 12/25/2022   sodium bicarbonate 650 MG tablet TAKE 1 TABLET BY MOUTH TWICE DAILY . APPOINTMENT REQUIRED FOR FUTURE REFILLS 60 tablet 0 12/25/2022   Tafamidis (VYNDAMAX) 61 MG CAPS Take 1 capsule (61 mg total)  by mouth daily. 30 capsule 12 12/26/2022   Torsemide 40 MG TABS Take 1 tablet by mouth daily. 90 tablet 3 12/26/2022   acetaminophen (TYLENOL) 500 MG tablet Take 1,000 mg by mouth every 6 (six) hours as needed for moderate pain.   More than a month   albuterol (VENTOLIN HFA) 108 (90 Base) MCG/ACT inhaler Inhale 2 puffs into the lungs every 4 (four) hours as needed for wheezing or shortness of breath. 18 g 1 More than a month   bismuth subsalicylate (PEPTO BISMOL) 262 MG/15ML suspension Take 30 mLs by mouth every 6 (six) hours as needed for indigestion.    More than a month   colchicine (COLCRYS) 0.6 MG tablet Take 2 tabs once and then one tab one hour later as needed for gout 15 tablet 2 More than a month   glucose blood (ACCU-CHEK AVIVA PLUS) test strip Use to check blood glucose 4 times a day. 400 strip 2    metolazone (ZAROXOLYN) 5 MG tablet Take 1 tablet (5 mg total) by mouth once a week on the same day of the week. 15 tablet 3 Unknown   rosuvastatin (CRESTOR) 5 MG tablet Take 1 tablet (5 mg total) by mouth daily. (Patient not taking: Reported on 12/25/2022) 90 tablet 3    TRUEplus Lancets 33G MISC USE 4 (FOUR) TIMES DAILY 400 each 2       Physical Exam: Vitals:   12/26/22 1314 12/26/22 1636  BP: (!) 153/98   Pulse: 64   Resp: 19   Temp: 97.7 F (36.5 C)   TempSrc: Temporal   SpO2: 94% 96%  Weight: 74.8 kg   Height: 5\' 6"  (1.676 m)     Gen: Appears comfortable, well-nourished CV: irregular, brady rhythm,  1+ dependent edema Pulm: Increased respiratory effort with accessory muscle use. Rales right lower lung fields. Some faint wheezing upper fields.  PERTINENT STUDIES SUMMARIZED:  Echocardiogram:      Multiple studies over the past 2 years with EF 30-35%  Heart Cath:    Imaging:   Congestion, fluid    EKG:   Atypical atrial flutter with slow ventricular response (personally reviewed)  TELEMETRY:     (personally reviewed)  DEVICE HISTORY:    Subcutaneous  ICD -- therapies turned off for this admission with plan for pacemaker   ASSESSMENT & PLAN:  Acute on chronic congestive heart failure TTR amyloidosis Chronic EF 30 to 35% Acute exacerbation lying flat for pacemaker procedure Appears volume overloaded with mild edema, Rales Lasix 40 mg IV ordered, titrate to effect Continue tafamidis  Chronic kidney disease - CKD IV-V Has left arm AV fistula in preparation for dialysis Has not required dialysis yet --creatinine 2.7 today  Atypical atrial flutter with slow ventricular response Heart rate 36 bpm in  clinic yesterday Has oftentimes had rates in the upper 40s and 50s over the past year Planning for placement of a CRT pacemaker when optimized and able to lie flat  Diabetes Managed with oral medications as an outpatient Hemoglobin A1c 7.0  CAD Percent LAD lesion in 2022 Avoiding left heart cath due to tenuous renal function, bordering ESRD    For questions or updates, please contact [facility] Please consult [email or web address] for contact info under Cardiology/STEMI.  Signed, York Pellant, MD 12/26/2022 5:52 PM

## 2022-12-26 NOTE — Interval H&P Note (Signed)
History and Physical Interval Note:  12/26/2022 4:03 PM  Corey [name]  has presented today for surgery, with the diagnosis of bradicardia.  The various methods of treatment have been discussed with the patient and family. After consideration of risks, benefits and other options for treatment, the patient has consented to  Procedure(s): PACEMAKER IMPLANT (N/A) as a surgical intervention.  The patient's history has been reviewed, patient examined, no change in status, stable for surgery.  I have reviewed the patient's chart and labs.  Questions were answered to the patient's satisfaction.     Roberts Gaudy 

## 2022-12-27 DIAGNOSIS — J9601 Acute respiratory failure with hypoxia: Secondary | ICD-10-CM

## 2022-12-27 DIAGNOSIS — I484 Atypical atrial flutter: Secondary | ICD-10-CM

## 2022-12-27 MED ORDER — TORSEMIDE 20 MG PO TABS
40.0000 mg | ORAL_TABLET | Freq: Every day | ORAL | Status: DC
  Administered 2022-12-27: 40 mg via ORAL
  Filled 2022-12-27: qty 2

## 2022-12-27 MED ORDER — ACETAMINOPHEN 325 MG PO TABS
650.0000 mg | ORAL_TABLET | ORAL | Status: DC | PRN
  Filled 2022-12-27: qty 2

## 2022-12-27 MED ORDER — ONDANSETRON HCL 4 MG/2ML IJ SOLN: 4.0000 mg | Freq: Four times a day (QID) | INTRAMUSCULAR | Status: DC | PRN

## 2022-12-27 NOTE — Plan of Care (Signed)
  Problem: Education: Goal: Knowledge of cardiac device and self-care will improve Outcome: Progressing   Problem: Cardiac: Goal: Ability to achieve and maintain adequate cardiopulmonary perfusion will improve Outcome: Progressing   Problem: Clinical Measurements: Goal: Diagnostic test results will improve Outcome: Progressing Goal: Respiratory complications will improve Outcome: Progressing   Problem: Activity: Goal: Risk for activity intolerance will decrease Outcome: Progressing   Problem: Elimination: Goal: Will not experience complications related to bowel motility Outcome: Progressing Goal: Will not experience complications related to urinary retention Outcome: Progressing

## 2022-12-27 NOTE — Plan of Care (Signed)

## 2022-12-27 NOTE — TOC CM/SW Note (Signed)
Transition of Care [facility]) - Inpatient Brief Assessment   Patient Details  Name: Corey [name] MRN: [id number] Date of Birth: October 21, 1945  [facility]) CM/SW Contact:    Gala Lewandowsky, RN Phone Number: 12/27/2022, 3:04 PM   Clinical Narrative: Patient presented for hypoxic respiratory failure and acute CHF.  EP is following the patient for atypical atrial flutter with slow ventricular response; per notes, the plan is for pacemaker implant 12-28-22. Case Manager will continue to follow for transition of care needs as the patient progresses.    Transition of Care Asessment: Insurance and Status: Insurance coverage has been reviewed Patient has primary care physician: Yes Prior/Current Home Services: No current home services Social Determinants of Health Reivew: SDOH reviewed no interventions necessary Readmission risk has been reviewed: Yes Transition of care needs: no transition of care needs at this time

## 2022-12-27 NOTE — Progress Notes (Signed)
  Patient Name: Corey [name] Date of Encounter: 12/27/2022  Primary Cardiologist: Donato Schultz, MD Electrophysiologist: Maurice Small, MD  Interval Summary   Implant yesterday aborted with desaturation and SOB.   Inpatient Medications    Scheduled Meds:  allopurinol  50 mg Oral q AM   Chlorhexidine Gluconate Cloth  6 each Topical Daily   hydrALAZINE  25 mg Oral TID   sodium bicarbonate  650 mg Oral Daily   Tafamidis  61 mg Oral Daily   torsemide  40 mg Oral Daily   Continuous Infusions:  PRN Meds: acetaminophen, albuterol, mouth rinse   Vital Signs    Vitals:   12/27/22 0700 12/27/22 0730 12/27/22 0751 12/27/22 0800  BP: (!) 127/92 (!) 124/98    Pulse: (!) 42 (!) 39  (!) 38  Resp: 20 (!) 28  (!) 22  Temp:   (!) 97.5 F (36.4 C)   TempSrc:   Oral   SpO2: 100% 100%  97%  Weight:      Height:        Intake/Output Summary (Last 24 hours) at 12/27/2022 0912 Last data filed at 12/27/2022 0800 Gross per 24 hour  Intake 240 ml  Output 650 ml  Net -410 ml   Filed Weights   12/26/22 1314 12/27/22 0600  Weight: 74.8 kg 72.5 kg    Physical Exam    GEN- The patient is well appearing, alert and oriented x 3 today.   Lungs- Clear to ausculation bilaterally, normal work of breathing Cardiac- Regular, slow rate and rhythm, no murmurs, rubs or gallops GI- soft, NT, ND, + BS Extremities- no clubbing or cyanosis. No edema  Telemetry    AFL with underlying bradycardia as low as into the 30s (personally reviewed)  Hospital Course    Corey [name] is a 77 y.o. male with a history of TTR Amyloid cardiomyopathy, CHFrEF, atrial flutter with slow ventricular response who is being admitted for hypoxic respiratory failure and acute congestive heart failure.   Assessment & Plan    Acute on chronic congestive heart failure TTR amyloidosis Chronic EF 30 to 35% Acute exacerbation lying flat for pacemaker procedure Improved this am.  Resume po torsemide -> will recheck later  this afternoon. Continue tafamidis   Chronic kidney disease - CKD IV-V Has left arm AV fistula in preparation for dialysis Creatinine, ser  2.80* (08/13 2350)   Atypical atrial flutter with slow ventricular response Heart rate into low 30s overnight.  Has oftentimes had rates in the upper 40s and 50s over the past year Planning for placement of a CRT pacemaker when optimized and able to lie flat -> tentatively tomorrow.    Diabetes Managed with oral medications as an outpatient Hemoglobin A1c 7.0   CAD Percent LAD lesion in 2022 Avoiding left heart cath due to tenuous renal function, bordering ESRD  Dr. Nelly Laurence has seen today. Still with some accessory muscle use, though denies SOB this am.    Will give torsemide this am and follow response today.   Tentatively will retry PPM implant tomorrow pending resp status.   For questions or updates, please contact [facility] Please consult [email or web address] for contact info under Cardiology/STEMI.  Signed, Graciella Freer, PA-C  12/27/2022, 9:12 AM

## 2022-12-28 ENCOUNTER — Encounter (HOSPITAL_COMMUNITY)
Admission: AD | Disposition: A | Discharge status: Home / Self Care | Payer: Self-pay | Source: Home / Self Care | Attending: Cardiovascular Disease

## 2022-12-28 ENCOUNTER — Other Ambulatory Visit: Payer: Self-pay

## 2022-12-28 ENCOUNTER — Encounter (HOSPITAL_COMMUNITY): Payer: Self-pay | Admitting: Cardiovascular Disease

## 2022-12-28 ENCOUNTER — Inpatient Hospital Stay (HOSPITAL_COMMUNITY): Payer: Medicare HMO | Admitting: Anesthesiology

## 2022-12-28 ENCOUNTER — Other Ambulatory Visit: Payer: Medicare HMO

## 2022-12-28 DIAGNOSIS — Z87891 Personal history of nicotine dependence: Secondary | ICD-10-CM

## 2022-12-28 DIAGNOSIS — I5042 Chronic combined systolic (congestive) and diastolic (congestive) heart failure: Secondary | ICD-10-CM | POA: Diagnosis not present

## 2022-12-28 DIAGNOSIS — I441 Atrioventricular block, second degree: Secondary | ICD-10-CM

## 2022-12-28 DIAGNOSIS — I130 Hypertensive heart and chronic kidney disease with heart failure and stage 1 through stage 4 chronic kidney disease, or unspecified chronic kidney disease: Secondary | ICD-10-CM | POA: Diagnosis not present

## 2022-12-28 DIAGNOSIS — R001 Bradycardia, unspecified: Secondary | ICD-10-CM

## 2022-12-28 DIAGNOSIS — N184 Chronic kidney disease, stage 4 (severe): Secondary | ICD-10-CM | POA: Diagnosis not present

## 2022-12-28 DIAGNOSIS — I4892 Unspecified atrial flutter: Secondary | ICD-10-CM | POA: Diagnosis not present

## 2022-12-28 HISTORY — PX: PACEMAKER IMPLANT: EP1218

## 2022-12-28 LAB — BASIC METABOLIC PANEL
Anion gap: 9 (ref 5–15)
BUN: 57 mg/dL — ABNORMAL HIGH (ref 8–23)
CO2: 24 mmol/L (ref 22–32)
Calcium: 9.1 mg/dL (ref 8.9–10.3)
Chloride: 106 mmol/L (ref 98–111)
Creatinine, Ser: 2.85 mg/dL — ABNORMAL HIGH (ref 0.61–1.24)
GFR, Estimated: 22 mL/min — ABNORMAL LOW (ref >60)
Glucose, Bld: 108 mg/dL — ABNORMAL HIGH (ref 70–99)
Potassium: 4.3 mmol/L (ref 3.5–5.1)
Sodium: 139 mmol/L (ref 135–145)

## 2022-12-28 LAB — GLUCOSE, CAPILLARY
Glucose-Capillary: 79 mg/dL (ref 70–99)
Glucose-Capillary: 71 mg/dL (ref 70–99)
Glucose-Capillary: 73 mg/dL (ref 70–99)
Glucose-Capillary: 113 mg/dL — ABNORMAL HIGH (ref 70–99)

## 2022-12-28 LAB — MAGNESIUM: Magnesium: 2.4 mg/dL (ref 1.7–2.4)

## 2022-12-28 SURGERY — PACEMAKER IMPLANT
Anesthesia: Monitor Anesthesia Care

## 2022-12-28 MED ORDER — CHLORHEXIDINE GLUCONATE 4 % EX SOLN
60.0000 mL | Freq: Once | CUTANEOUS | Status: AC
  Administered 2022-12-28: 4 via TOPICAL

## 2022-12-28 MED ORDER — INSULIN ASPART 100 UNIT/ML IJ SOLN: 0.0000 [IU] | INTRAMUSCULAR | Status: DC | PRN

## 2022-12-28 MED ORDER — CHLORHEXIDINE GLUCONATE 0.12 % MT SOLN
OROMUCOSAL | Status: AC
  Administered 2022-12-28: 15 mL
  Filled 2022-12-28: qty 15

## 2022-12-28 MED ORDER — FENTANYL CITRATE (PF) 100 MCG/2ML IJ SOLN
INTRAMUSCULAR | Status: DC | PRN
  Administered 2022-12-28 (×6): 25 ug via INTRAVENOUS

## 2022-12-28 MED ORDER — SODIUM CHLORIDE 0.9 % IV SOLN: INTRAVENOUS | Status: DC

## 2022-12-28 MED ORDER — SODIUM CHLORIDE 0.9 % IV SOLN
80.0000 mg | INTRAVENOUS | Status: AC
  Filled 2022-12-28: qty 2

## 2022-12-28 MED ORDER — LIDOCAINE HCL (PF) 1 % IJ SOLN
INTRAMUSCULAR | Status: DC | PRN
  Administered 2022-12-28: 60 mL

## 2022-12-28 MED ORDER — CEFAZOLIN SODIUM-DEXTROSE 1-4 GM/50ML-% IV SOLN
1.0000 g | Freq: Four times a day (QID) | INTRAVENOUS | Status: AC
  Administered 2022-12-28 – 2022-12-29 (×3): 1 g via INTRAVENOUS
  Filled 2022-12-28 (×3): qty 50

## 2022-12-28 MED ORDER — IOHEXOL 350 MG/ML SOLN
INTRAVENOUS | Status: DC | PRN
  Administered 2022-12-28: 10 mL

## 2022-12-28 MED ORDER — HEPARIN (PORCINE) IN NACL 1000-0.9 UT/500ML-% IV SOLN
INTRAVENOUS | Status: DC | PRN
  Administered 2022-12-28: 500 mL

## 2022-12-28 MED ORDER — MIDAZOLAM HCL 2 MG/2ML IJ SOLN
INTRAMUSCULAR | Status: DC | PRN
  Administered 2022-12-28: .5 mg via INTRAVENOUS

## 2022-12-28 MED ORDER — TORSEMIDE 20 MG PO TABS
40.0000 mg | ORAL_TABLET | Freq: Every day | ORAL | Status: DC
  Administered 2022-12-28 – 2022-12-29 (×2): 40 mg via ORAL
  Filled 2022-12-28 (×2): qty 2

## 2022-12-28 MED ORDER — LIDOCAINE HCL (PF) 1 % IJ SOLN
INTRAMUSCULAR | Status: AC
  Filled 2022-12-28: qty 30

## 2022-12-28 MED ORDER — CEFAZOLIN SODIUM-DEXTROSE 2-4 GM/100ML-% IV SOLN
2.0000 g | INTRAVENOUS | Status: AC
  Administered 2022-12-28: 2 g via INTRAVENOUS
  Filled 2022-12-28: qty 100

## 2022-12-28 MED ORDER — SODIUM CHLORIDE 0.9 % IV SOLN
INTRAVENOUS | Status: AC
  Administered 2022-12-28: 80 mg
  Filled 2022-12-28: qty 2

## 2022-12-28 MED ORDER — LIDOCAINE HCL 1 % IJ SOLN
INTRAMUSCULAR | Status: AC
  Filled 2022-12-28: qty 60

## 2022-12-28 MED ORDER — INSULIN ASPART 100 UNIT/ML IJ SOLN: 0.0000 [IU] | Freq: Three times a day (TID) | INTRAMUSCULAR | Status: DC

## 2022-12-28 MED ORDER — CEFAZOLIN SODIUM-DEXTROSE 2-4 GM/100ML-% IV SOLN
INTRAVENOUS | Status: AC
  Filled 2022-12-28: qty 100

## 2022-12-28 MED ORDER — CHLORHEXIDINE GLUCONATE 4 % EX SOLN
60.0000 mL | Freq: Once | CUTANEOUS | Status: AC
  Filled 2022-12-28: qty 60

## 2022-12-28 MED ORDER — CHLORHEXIDINE GLUCONATE 4 % EX SOLN
CUTANEOUS | Status: AC
  Administered 2022-12-28: 4 via TOPICAL
  Filled 2022-12-28: qty 15

## 2022-12-28 SURGICAL SUPPLY — 21 items
ADAPTER SEALING SSSA-09 (ADAPTER) IMPLANT
BALLN ATTAIN 80 (BALLOONS) ×1
BALLOON ATTAIN 80 (BALLOONS) IMPLANT
CABLE SURGICAL S-101-97-12 (CABLE) ×2 IMPLANT
CATH ATTAIN COM SURV 6250V-EH (CATHETERS) IMPLANT
CATH HIS SELECTSITE C304HIS (CATHETERS) IMPLANT
CATH RIGHTSITE C315HIS02 (CATHETERS) IMPLANT
DEVICE CRTP PERCEPTA QUAD MRI (Pacemaker) IMPLANT
KIT MICROPUNCTURE NIT STIFF (SHEATH) IMPLANT
LEAD ATTAIN PERFORM ST 4398-88 (Lead) IMPLANT
LEAD SELECT SECURE 3830 383069 (Lead) IMPLANT
PAD DEFIB RADIO PHYSIO CONN (PAD) ×2 IMPLANT
PIN PLUG IS-1 DEFIB (PIN) IMPLANT
SELECT SECURE 3830 383069 (Lead) ×1 IMPLANT
SHEATH 7FR PRELUDE SNAP 13 (SHEATH) IMPLANT
SHEATH 9FR PRELUDE SNAP 13 (SHEATH) IMPLANT
SHEATH PROBE COVER 6X72 (BAG) IMPLANT
SLITTER 6232ADJ (MISCELLANEOUS) IMPLANT
TRAY PACEMAKER INSERTION (PACKS) ×2 IMPLANT
WIRE ACUITY WHISPER EDS 4648 (WIRE) IMPLANT
WIRE HI TORQ VERSACORE-J 145CM (WIRE) IMPLANT

## 2022-12-28 NOTE — Anesthesia Postprocedure Evaluation (Signed)
Anesthesia Post Note  Patient: Corey [name]  Procedure(s) Performed: PACEMAKER IMPLANT     Patient location during evaluation: PACU Anesthesia Type: MAC Level of consciousness: awake and alert Pain management: pain level controlled Vital Signs Assessment: post-procedure vital signs reviewed and stable Respiratory status: spontaneous breathing, nonlabored ventilation, respiratory function stable and patient connected to nasal cannula oxygen Cardiovascular status: blood pressure returned to baseline and stable Postop Assessment: no apparent nausea or vomiting Anesthetic complications: no   No notable events documented.  Last Vitals:  Vitals:   12/28/22 1122 12/28/22 1245  BP:  136/81  Pulse:  (!) 38  Resp:  20  Temp: 36.7 C   SpO2:  100%    Last Pain:  Vitals:   12/28/22 1245  TempSrc: Oral  PainSc: 0-No pain                 Bynum Nation

## 2022-12-28 NOTE — Plan of Care (Signed)

## 2022-12-28 NOTE — Progress Notes (Signed)
  Patient Name: Corey [name] Date of Encounter: 12/28/2022  Primary Cardiologist: Donato Schultz, MD Electrophysiologist: Maurice Small, MD  Interval Summary   NAEO.   Breathing remains improved. Remains on 3L O2 via [place]  Inpatient Medications    Scheduled Meds:  allopurinol  50 mg Oral q AM   Chlorhexidine Gluconate Cloth  6 each Topical Daily   gentamicin (GARAMYCIN) 80 mg in sodium chloride 0.9 % 500 mL irrigation  80 mg Irrigation On Call   hydrALAZINE  25 mg Oral TID   sodium bicarbonate  650 mg Oral Daily   torsemide  40 mg Oral Daily   Continuous Infusions:   ceFAZolin (ANCEF) IV     PRN Meds: acetaminophen, acetaminophen, albuterol, ondansetron (ZOFRAN) IV, mouth rinse   Vital Signs    Vitals:   12/28/22 0807 12/28/22 0900 12/28/22 0910 12/28/22 0915  BP:  122/89 122/88   Pulse:  (!) 45 (!) 44 (!) 41  Resp:  (!) 28 (!) 24 (!) 32  Temp: 97.7 F (36.5 C)     TempSrc: Oral     SpO2:  96% 95% 100%  Weight:      Height:        Intake/Output Summary (Last 24 hours) at 12/28/2022 0935 Last data filed at 12/28/2022 0600 Gross per 24 hour  Intake --  Output 1350 ml  Net -1350 ml   Filed Weights   12/26/22 1314 12/27/22 0600 12/28/22 0600  Weight: 74.8 kg 72.5 kg 73.1 kg    Physical Exam    GEN- The patient is well appearing, alert and oriented x 3 today.   Lungs- Clear to ausculation bilaterally, normal work of breathing Cardiac- Slow but regular rate and rhythm, no murmurs, rubs or gallops GI- soft, NT, ND, + BS Extremities- no clubbing or cyanosis. No edema  Telemetry    AFL with slow 40-50s currently (personally reviewed)  Hospital Course    Corey [name] is a 77 y.o. male with a history of TTR Amyloid cardiomyopathy, CHFrEF, atrial flutter with slow ventricular response who is being admitted for hypoxic respiratory failure and acute congestive heart failure.   Assessment & Plan    Acute on chronic congestive heart failure TTR  amyloidosis Chronic EF 30 to 35% Breathing improved. Plan po torsemide this am. Continue tafamidis   Chronic kidney disease - CKD IV-V Has left arm AV fistula in preparation for dialysis Creatinine, ser  2.85* (08/14 2357) -> Pending today.    Atypical atrial flutter with slow ventricular response Heart rate into low 30s overnight. 40-50s mostly today.  Planning for R sided device today, Left bundle lead +/- CS lead.    Diabetes Managed with oral medications as an outpatient Hemoglobin A1c 7.0 Follow   CAD Percent LAD lesion in 2022 Avoiding left heart cath due to tenuous renal function, bordering ESRD  For questions or updates, please contact [facility] Please consult [email or web address] for contact info under Cardiology/STEMI.  Signed, Graciella Freer, PA-C  12/28/2022, 9:35 AM

## 2022-12-28 NOTE — Transfer of Care (Signed)
Immediate Anesthesia Transfer of Care Note  Patient: Corey [name]  Procedure(s) Performed: PACEMAKER IMPLANT  Patient Location: ICU  Anesthesia Type:MAC  Level of Consciousness: awake, alert , and oriented  Airway & Oxygen Therapy: Patient Spontanous Breathing and Patient connected to face mask oxygen  Post-op Assessment: Report given to RN and Post -op Vital signs reviewed and stable  Post vital signs: Reviewed and stable  Last Vitals: see ICU VS flowsheet Vitals Value Taken Time  BP    Temp    Pulse    Resp    SpO2      Last Pain:  Vitals:   12/28/22 1245  TempSrc: Oral  PainSc: 0-No pain      Patients Stated Pain Goal: 0 (12/27/22 2330)  Complications: No notable events documented.

## 2022-12-28 NOTE — Anesthesia Procedure Notes (Signed)
Procedure Name: MAC Date/Time: 12/28/2022 1:06 PM  Performed by: Garfield Cornea, CRNAPre-anesthesia Checklist: Patient identified, Emergency Drugs available, Suction available and Patient being monitored Patient Re-evaluated:Patient Re-evaluated prior to induction Oxygen Delivery Method: Simple face mask Dental Injury: Teeth and Oropharynx as per pre-operative assessment

## 2022-12-28 NOTE — Anesthesia Preprocedure Evaluation (Addendum)
Anesthesia Evaluation    Reviewed: Allergy & Precautions, Patient's Chart, lab work & pertinent test results  Airway Mallampati: II  TM Distance: >3 FB Neck ROM: Full    Dental no notable dental hx.    Pulmonary sleep apnea , former smoker AHRF 2/2 acute HF exacerbation, currently saturating well on 2LPM [place]    Pulmonary exam normal breath sounds clear to auscultation       Cardiovascular hypertension, Pt. on medications + Past MI (2014, PEA arrest), +CHF (cardiac amyloid, LVEF 30-35%, mild RV dysfunction) and + DOE  Normal cardiovascular exam+ Valvular Problems/Murmurs (mild-mod TR)  Rhythm:Regular Rate:Normal  presented to EP clinic 8/12 for routine follow up and was noted to have atypical aflutter with slow ventricular response (rate 36bpm yesterday).  Attempted PPM implant (without anesthesia) 8/13, but upon lying him flat, he became hypoxic with increased respiratory effort.  His oxygen saturations were in the 80s on NRB- canceled the case and admitted him for management of hypoxic respiratory failure  TEE 06/23/22:  1. Left ventricular ejection fraction, by estimation, is 30 to 35%. The  left ventricle has moderately decreased function. There is severe  concentric left ventricular hypertrophy. Left ventricular diastolic  function could not be evaluated.   2. Right ventricular systolic function is mildly reduced. The right  ventricular size is normal. Mildly increased right ventricular wall  thickness.   3. Left atrial size was severely dilated. No left atrial/left atrial  appendage thrombus was detected.   4. Right atrial size was moderately dilated.   5. The mitral valve is normal in structure. No evidence of mitral valve  regurgitation.   6. Tricuspid valve regurgitation is mild to moderate.   7. The aortic valve is normal in structure. Aortic valve regurgitation is  not visualized.      Neuro/Psych Seizures - (only once  a/w CVA 2014, no antiepileptics), Well Controlled,  CVA (2014)  negative psych ROS   GI/Hepatic negative GI ROS, Neg liver ROS,,,  Endo/Other  diabetes, Well Controlled, Type 2  Last A1c 7  Renal/GU CRFRenal diseaseCr 2.8, known CKD 4-5. Has AVF in place in preparation for HD in future  negative genitourinary   Musculoskeletal negative musculoskeletal ROS (+)    Abdominal   Peds  Hematology negative hematology ROS (+) Hb 14.3   Anesthesia Other Findings   Reproductive/Obstetrics negative OB ROS                             Anesthesia Physical Anesthesia Plan  ASA: 4  Anesthesia Plan: MAC   Post-op Pain Management:    Induction: Intravenous  PONV Risk Score and Plan: Ondansetron and Treatment may vary due to age or medical condition  Airway Management Planned: Natural Airway  Additional Equipment: Arterial line  Intra-op Plan:   Post-operative Plan:   Informed Consent:   Plan Discussed with: CRNA  Anesthesia Plan Comments:         Anesthesia Quick Evaluation

## 2022-12-29 ENCOUNTER — Inpatient Hospital Stay (HOSPITAL_COMMUNITY): Payer: Medicare HMO

## 2022-12-29 ENCOUNTER — Encounter (HOSPITAL_COMMUNITY): Payer: Self-pay | Admitting: Cardiovascular Disease

## 2022-12-29 ENCOUNTER — Other Ambulatory Visit (HOSPITAL_COMMUNITY): Payer: Self-pay

## 2022-12-29 ENCOUNTER — Other Ambulatory Visit: Payer: Self-pay | Admitting: Cardiology

## 2022-12-29 DIAGNOSIS — I480 Paroxysmal atrial fibrillation: Secondary | ICD-10-CM

## 2022-12-29 LAB — GLUCOSE, CAPILLARY
Glucose-Capillary: 48 mg/dL — ABNORMAL LOW (ref 70–99)
Glucose-Capillary: 62 mg/dL — ABNORMAL LOW (ref 70–99)
Glucose-Capillary: 63 mg/dL — ABNORMAL LOW (ref 70–99)
Glucose-Capillary: 100 mg/dL — ABNORMAL HIGH (ref 70–99)

## 2022-12-29 LAB — BASIC METABOLIC PANEL
Anion gap: 13 (ref 5–15)
BUN: 49 mg/dL — ABNORMAL HIGH (ref 8–23)
CO2: 22 mmol/L (ref 22–32)
Calcium: 8.9 mg/dL (ref 8.9–10.3)
Chloride: 104 mmol/L (ref 98–111)
Creatinine, Ser: 2.57 mg/dL — ABNORMAL HIGH (ref 0.61–1.24)
GFR, Estimated: 25 mL/min — ABNORMAL LOW (ref >60)
Glucose, Bld: 89 mg/dL (ref 70–99)
Potassium: 3.7 mmol/L (ref 3.5–5.1)
Sodium: 139 mmol/L (ref 135–145)

## 2022-12-29 LAB — MAGNESIUM: Magnesium: 2.2 mg/dL (ref 1.7–2.4)

## 2022-12-29 MED ORDER — APIXABAN 5 MG PO TABS: 5.0000 mg | ORAL_TABLET | Freq: Two times a day (BID) | ORAL | Status: DC

## 2022-12-29 NOTE — Progress Notes (Signed)
SICD turned on per Otilio Saber.   Joey Deakins  BSX

## 2022-12-29 NOTE — Discharge Instructions (Signed)
After Your Pacemaker   You have a Medtronic Pacemaker  ACTIVITY Do not lift your arm above shoulder height for 1 week after your procedure. After 7 days, you may progress as below.  You should remove your sling 24 hours after your procedure, unless otherwise instructed by your provider.     Friday January 05, 2023  Saturday January 06, 2023 Sunday January 07, 2023 Monday January 08, 2023   Do not lift, push, pull, or carry anything over 10 pounds with the affected arm until 6 weeks (Friday February 09, 2023 ) after your procedure.   You may drive AFTER your wound check, unless you have been told otherwise by your provider.   Ask your healthcare provider when you can go back to work   INCISION/Dressing Resume your Eliquis on Sunday, 8/18 with the morning dose.   If large square, outer bandage is left in place, this can be removed after 24 hours from your procedure. Do not remove steri-strips or glue as below.   If a PRESSURE DRESSING (a bulky dressing that usually goes up over your shoulder) was applied or left in place, please follow instructions given by your provider on when to return to have this removed.   Monitor your Pacemaker site for redness, swelling, and drainage. Call the device clinic at [phone] if you experience these symptoms or fever/chills.  If your incision is sealed with Steri-strips or staples, you may shower 7 days after your procedure or when told by your provider. Do not remove the steri-strips or let the shower hit directly on your site. You may wash around your site with soap and water.    If you were discharged in a sling, please do not wear this during the day more than 48 hours after your surgery unless otherwise instructed. This may increase the risk of stiffness and soreness in your shoulder.   Avoid lotions, ointments, or perfumes over your incision until it is well-healed.  You may use a hot tub or a pool AFTER your wound check appointment if the  incision is completely closed.  Pacemaker Alerts:  Some alerts are vibratory and others beep. These are NOT emergencies. Please call our office to let us know. If this occurs at night or on weekends, it can wait until the next business day. Send a remote transmission.  If your device is capable of reading fluid status (for heart failure), you will be offered monthly monitoring to review this with you.   DEVICE MANAGEMENT Remote monitoring is used to monitor your pacemaker from home. This monitoring is scheduled every 91 days by our office. It allows [place] to keep an eye on the functioning of your device to ensure it is working properly. You will routinely see your Electrophysiologist annually (more often if necessary).   You should receive your ID card for your new device in 4-8 weeks. Keep this card with you at all times once received. Consider wearing a medical alert bracelet or necklace.  Your Pacemaker may be MRI compatible. This will be discussed at your next office visit/wound check.  You should avoid contact with strong electric or magnetic fields.   Do not use amateur (ham) radio equipment or electric (arc) welding torches. MP3 player headphones with magnets should not be used. Some devices are safe to use if held at least 12 inches (30 cm) from your Pacemaker. These include power tools, lawn mowers, and speakers. If you are unsure if something is safe to use, ask your  health care provider.  When using your cell phone, hold it to the ear that is on the opposite side from the Pacemaker. Do not leave your cell phone in a pocket over the Pacemaker.  You may safely use electric blankets, heating pads, computers, and microwave ovens.  Call the office right away if: You have chest pain. You feel more short of breath than you have felt before. You feel more light-headed than you have felt before. Your incision starts to open up.  This information is not intended to replace advice given to you  by your health care provider. Make sure you discuss any questions you have with your health care provider.

## 2022-12-29 NOTE — Plan of Care (Signed)
  Problem: Education: Goal: Knowledge of cardiac device and self-care will improve Outcome: Progressing Goal: Ability to safely manage health related needs after discharge will improve Outcome: Progressing Goal: Individualized Educational Video(s) Outcome: Progressing   Problem: Cardiac: Goal: Ability to achieve and maintain adequate cardiopulmonary perfusion will improve Outcome: Progressing   Problem: Education: Goal: Knowledge of General Education information will improve Description: Including pain rating scale, medication(s)/side effects and non-pharmacologic comfort measures Outcome: Progressing   Problem: Health Behavior/Discharge Planning: Goal: Ability to manage health-related needs will improve Outcome: Progressing   Problem: Clinical Measurements: Goal: Ability to maintain clinical measurements within normal limits will improve Outcome: Progressing Goal: Will remain free from infection Outcome: Progressing Goal: Diagnostic test results will improve Outcome: Progressing Goal: Respiratory complications will improve Outcome: Progressing Goal: Cardiovascular complication will be avoided Outcome: Progressing   Problem: Activity: Goal: Risk for activity intolerance will decrease Outcome: Progressing   Problem: Nutrition: Goal: Adequate nutrition will be maintained Outcome: Progressing   Problem: Coping: Goal: Level of anxiety will decrease Outcome: Progressing   Problem: Elimination: Goal: Will not experience complications related to bowel motility Outcome: Progressing Goal: Will not experience complications related to urinary retention Outcome: Progressing   Problem: Pain Managment: Goal: General experience of comfort will improve Outcome: Progressing   Problem: Safety: Goal: Ability to remain free from injury will improve Outcome: Progressing   Problem: Skin Integrity: Goal: Risk for impaired skin integrity will decrease Outcome: Progressing    Problem: Education: Goal: Ability to describe self-care measures that may prevent or decrease complications (Diabetes Survival Skills Education) will improve Outcome: Progressing Goal: Individualized Educational Video(s) Outcome: Progressing   Problem: Coping: Goal: Ability to adjust to condition or change in health will improve Outcome: Progressing   Problem: Fluid Volume: Goal: Ability to maintain a balanced intake and output will improve Outcome: Progressing   Problem: Health Behavior/Discharge Planning: Goal: Ability to identify and utilize available resources and services will improve Outcome: Progressing Goal: Ability to manage health-related needs will improve Outcome: Progressing   Problem: Metabolic: Goal: Ability to maintain appropriate glucose levels will improve Outcome: Progressing   Problem: Nutritional: Goal: Maintenance of adequate nutrition will improve Outcome: Progressing Goal: Progress toward achieving an optimal weight will improve Outcome: Progressing   Problem: Skin Integrity: Goal: Risk for impaired skin integrity will decrease Outcome: Progressing   Problem: Tissue Perfusion: Goal: Adequacy of tissue perfusion will improve Outcome: Progressing

## 2022-12-29 NOTE — Discharge Summary (Signed)
ELECTROPHYSIOLOGY PROCEDURE DISCHARGE SUMMARY    Patient ID: Corey [name],  MRN: [id number], DOB/AGE: 02/03/1946 77 y.o.  Admit date: 12/26/2022 Discharge date: 12/29/2022  Primary Care Physician: Pincus Sanes, MD  Primary Cardiologist: Donato Schultz, MD  Electrophysiologist: Dr. Nelly Laurence   Primary Discharge Diagnosis:  Symptomatic bradycardia status post pacemaker implantation this admission  Secondary Discharge Diagnosis:  Acute on Chronic systolic CHF with S-ICD in place Cardiac amyloidosis  Allergies  Allergen Reactions   Hydrochlorothiazide Other (See Comments)    Gout , uncontrolled diabetes and renal insufficiency     Procedures This Admission:  1.  Implantation of a Medtronic CRT PPM on 8/15 by Dr. Nelly Laurence. The patient received a Medtronic Percepta Quad CRT-P [id number] with a Medtronic SelectSure [id number] right ventricular lead in the left bundle position and a Medtronic Attain Perform St [id number] left ventricular lead.  There were no immediate post procedure complications.   2.  CXR on 12/29/2022 demonstrated no pneumothorax status post device implantation.       Brief HPI: Corey [name] is a 77 y.o. male was admitted for PPM implantation in setting of symptomatic bradycardia. Initial case was deferred due to oxygen desaturation.   Hospital Course:  The patient was admitted for PPM implant and found to be in A/C CHF. Diuresed with significant improvement. Pt underwent implantation of a Medtronic CRT-P with details as outlined above.  He was monitored on telemetry overnight which demonstrated appropriate pacing.  Left chest was without hematoma or ecchymosis.  The device was interrogated and found to be functioning normally.  CXR was obtained and demonstrated no pneumothorax status post device implantation.  Wound care, arm mobility, and restrictions were reviewed with the patient.  The patient was examined and considered stable for discharge to home.    Anticoagulation  resumption This patient should resume their Eliquis on Sunday December 31, 2022 with the am dose.     Physical Exam: Vitals:   12/29/22 0520 12/29/22 0600 12/29/22 0700 12/29/22 0800  BP: 135/89 120/72 (!) 107/92 (!) 102/46  Pulse: 62 (!) 59 60 60  Resp: 15 (!) 21 (!) 26 14  Temp:      TempSrc:      SpO2: 100% 99% 100% 98%  Weight: 73.1 kg     Height:        GEN- NAD. A&O x 3.  HEENT: Normocephalic, atraumatic Lungs- CTAB, Normal effort.  Heart- RRR, No M/G/R.  GI- Soft, NT, ND.  Extremities- No clubbing, cyanosis, or edema;  Skin- warm and dry, no rash or lesion, left chest without hematoma/ecchymosis  Discharge Medications:  Allergies as of 12/29/2022       Reactions   Hydrochlorothiazide Other (See Comments)   Gout , uncontrolled diabetes and renal insufficiency        Medication List     STOP taking these medications    metolazone 5 MG tablet Commonly known as: ZAROXOLYN       TAKE these medications    Accu-Chek Aviva Plus test strip Generic drug: glucose blood Use to check blood glucose 4 times a day.   acetaminophen 500 MG tablet Commonly known as: TYLENOL Take 1,000 mg by mouth as needed for moderate pain.   albuterol 108 (90 Base) MCG/ACT inhaler Commonly known as: VENTOLIN HFA Inhale 2 puffs into the lungs every 4 (four) hours as needed for wheezing or shortness of breath.   allopurinol 100 MG tablet Commonly known as: ZYLOPRIM Take 50 mg by mouth  in the morning.   apixaban 5 MG Tabs tablet Commonly known as: ELIQUIS Take 1 tablet (5 mg total) by mouth 2 (two) times daily. Resume with the morning dose on Sunday 8/18 Start taking on: December 31, 2022 What changed:  additional instructions These instructions start on December 31, 2022. If you are unsure what to do until then, ask your doctor or other care provider.   bismuth subsalicylate 262 MG/15ML suspension Commonly known as: PEPTO BISMOL Take 30 mLs by mouth every 6 (six) hours as needed  for indigestion.   cholecalciferol 25 MCG (1000 UNIT) tablet Commonly known as: VITAMIN D3 Take 1,000 Units by mouth daily.   colchicine 0.6 MG tablet Commonly known as: Colcrys Take 2 tabs once and then one tab one hour later as needed for gout What changed:  how much to take how to take this when to take this reasons to take this additional instructions   hydrALAZINE 25 MG tablet Commonly known as: APRESOLINE TAKE 1 TABLET THREE TIMES DAILY   rosuvastatin 5 MG tablet Commonly known as: CRESTOR Take 1 tablet (5 mg total) by mouth daily.   sitaGLIPtin 50 MG tablet Commonly known as: Januvia Take 1 tablet (50 mg total) by mouth daily.   sodium bicarbonate 650 MG tablet TAKE 1 TABLET BY MOUTH TWICE DAILY . APPOINTMENT REQUIRED FOR FUTURE REFILLS What changed: See the new instructions.   torsemide 20 MG tablet Commonly known as: DEMADEX Take 40 mg by mouth 2 (two) times daily. What changed: Another medication with the same name was removed. Continue taking this medication, and follow the directions you see here.   TRUEplus Lancets 33G Misc USE 4 (FOUR) TIMES DAILY   Vyndamax 61 MG Caps Generic drug: Tafamidis Take 1 capsule (61 mg total) by mouth daily.        Disposition:    Follow-up Information     [facility] Follow up.   Specialty: Cardiology Why: on 8/29 at 240 for post pacemaker follow up Contact information: [address] [place] [address] [phone]                Duration of Discharge Encounter: Greater than 30 minutes including physician time.  Dustin Flock, PA-C  12/29/2022 9:05 AM

## 2022-12-29 NOTE — Telephone Encounter (Signed)
Eliquis 5mg  refill request received. Patient is 77 years old, weight-73.1kg, Crea-2.57 on 12/29/22, Diagnosis-Afib/aflutter, and last seen by Dr. Nelly Laurence on 12/25/22. Dose is appropriate based on dosing criteria. Will send in refill to requested pharmacy.

## 2022-12-29 NOTE — Progress Notes (Addendum)
1610 blood sugar 62. Gave 4 oz of orange juice. Recheck in 15 min.   0655 Recheck 43. Gave another 4 oz orange juice and patient is eating breakfast (banana, potatoes, eggs). Recheck in 15 min.  Recheck 0711 BS 63. Patient eating and day shift nurse aware.

## 2023-01-01 ENCOUNTER — Encounter: Payer: Self-pay | Admitting: *Deleted

## 2023-01-01 ENCOUNTER — Telehealth: Payer: Self-pay | Admitting: *Deleted

## 2023-01-01 NOTE — Transitions of Care (Post Inpatient/ED Visit) (Signed)
01/01/2023  Name: Corey [name] MRN: [id number] DOB: [birth year]  Today's TOC FU Call Status: Today's TOC FU Call Status:: Successful TOC FU Call Completed TOC FU Call Complete Date: 01/01/23  Transition Care Management Follow-up Telephone Call Date of Discharge: 12/29/22 Discharge Facility: [facility]) Type of Discharge: Inpatient Admission Primary Inpatient Discharge Diagnosis:: hypoxic respiratory failure; acute CHF exacerbation; bradycardia- with permanent pacemaker insertion How have you been since you were released from the hospital?: Better (per spouse: "He is a little weak and the pacemaker site is a little sore, but other than that he is doing fine") Any questions or concerns?: No  Items Reviewed: Did you receive and understand the discharge instructions provided?: Yes (thoroughly reviewed with patient's spouse who verbalizes good understanding of same) Medications obtained,verified, and reconciled?: Yes (Medications Reviewed) (Full medication reconciliation/ review completed; no concerns or discrepancies identified; confirmed patient obtained/ is taking all newly Rx'd medications as instructed; spouse-manages medications and denies questions/ concerns around medications today) Any new allergies since your discharge?: No Dietary orders reviewed?: Yes Type of Diet Ordered:: Heart Healthy Low salt Do you have support at home?: Yes People in Home: spouse Name of Support/Comfort Primary Source: Reports independent in self-care activities; supportive spouse assists as/ if needed/ indicated  Medications Reviewed Today: Medications Reviewed Today     Reviewed by Michaela Corner, RN (Registered Nurse) on 01/01/23 at 1031  Med List Status: <None>   Medication Order Taking? Sig Documenting Provider Last Dose Status Informant  acetaminophen (TYLENOL) 500 MG tablet [id number] Yes Take 1,000 mg by mouth as needed for moderate pain. [provider] Taking Active  Spouse/Significant Other, Pharmacy Records           Med Note Epimenio Sarin, Marcy Siren   Wed Dec 27, 2022 10:43 AM) Wife is unsure of last dose.   albuterol (VENTOLIN HFA) 108 (90 Base) MCG/ACT inhaler [id number] Yes Inhale 2 puffs into the lungs every 4 (four) hours as needed for wheezing or shortness of breath. Valinda Hoar, NP Taking Active Spouse/Significant Other, Pharmacy Records           Med Note (CRUTHIS, Marcy Siren   Wed Dec 27, 2022 10:43 AM) Wife is unsure of last dose.   allopurinol (ZYLOPRIM) 100 MG tablet [id number] Yes Take 50 mg by mouth in the morning. [provider] Taking Active Spouse/Significant Other, Pharmacy Records  apixaban (ELIQUIS) 5 MG TABS tablet [id number] Yes Take 1 tablet by mouth twice daily Mealor, Roberts Gaudy, MD Taking Active   bismuth subsalicylate (PEPTO BISMOL) 262 MG/15ML suspension [id number] Yes Take 30 mLs by mouth every 6 (six) hours as needed for indigestion. [provider] Taking Active Spouse/Significant Other, Pharmacy Records           Med Note Epimenio Sarin, Marcy Siren   Wed Dec 27, 2022 10:43 AM) Wife is unsure of last dose.   cholecalciferol (VITAMIN D3) 25 MCG (1000 UNIT) tablet [id number] Yes Take 1,000 Units by mouth daily. [provider] Taking Active Spouse/Significant Other, Pharmacy Records  colchicine (COLCRYS) 0.6 MG tablet [id number] Yes Take 2 tabs once and then one tab one hour later as needed for gout  Patient taking differently: Take 0.6 mg by mouth as needed (gout).   Pincus Sanes, MD Taking Active Spouse/Significant Other, Pharmacy Records           Med Note Epimenio Sarin, Con Memos Dec 27, 2022 10:43 AM) Wife is unsure of last dose.   glucose blood (  ACCU-CHEK AVIVA PLUS) test strip [id number] Yes Use to check blood glucose 4 times a day. Reather Littler, MD Taking Active Spouse/Significant Other, Pharmacy Records  hydrALAZINE (APRESOLINE) 25 MG tablet [id number] Yes TAKE 1 TABLET THREE TIMES DAILY Burns, [name], MD Taking  Active Spouse/Significant Other, Pharmacy Records  rosuvastatin (CRESTOR) 5 MG tablet [id number] Yes Take 1 tablet (5 mg total) by mouth daily. Mealor, Roberts Gaudy, MD Taking Active Spouse/Significant Other, Pharmacy Records           Med Note (CRUTHIS, CHLOE C   Wed Dec 27, 2022 10:01 AM)    sitaGLIPtin (JANUVIA) 50 MG tablet [id number] Yes Take 1 tablet (50 mg total) by mouth daily. Reather Littler, MD Taking Active Spouse/Significant Other, Pharmacy Records  sodium bicarbonate 650 MG tablet [id number] Yes TAKE 1 TABLET BY MOUTH TWICE DAILY . APPOINTMENT REQUIRED FOR FUTURE REFILLS  Patient taking differently: Take 650 mg by mouth 2 (two) times daily.   Pincus Sanes, MD Taking Active Spouse/Significant Other, Pharmacy Records  Tafamidis [facility]) 61 MG CAPS [id number] Yes Take 1 capsule (61 mg total) by mouth daily. Jake Bathe, MD Taking Active Spouse/Significant Other, Pharmacy Records  torsemide [facility]) 20 MG tablet [id number] Yes Take 40 mg by mouth 2 (two) times daily. [provider] Taking Active Spouse/Significant Other, Pharmacy Records  TRUEplus Lancets 33G MISC [id number] Yes USE 4 (FOUR) TIMES DAILY Reather Littler, MD Taking Active Spouse/Significant Other, Pharmacy Records           Home Care and Equipment/Supplies: Were Home Health Services Ordered?: No Any new equipment or medical supplies ordered?: No  Functional Questionnaire: Do you need assistance with bathing/showering or dressing?: No Do you need assistance with meal preparation?: No Do you need assistance with eating?: No Do you have difficulty maintaining continence: No Do you need assistance with getting out of bed/getting out of a chair/moving?: No Do you have difficulty managing or taking your medications?: Yes (spouse manages all aspects of medication admihnistration)  Follow up appointments reviewed: PCP Follow-up appointment confirmed?: NA (verified not indicated per hospital discharging provider  discharge notes) Specialist Hospital Follow-up appointment confirmed?: Yes Date of Specialist follow-up appointment?: 01/11/23 (verified this is recommended time frame for follow up per hospital discharging provider notes) Follow-Up Specialty Provider:: cardiology team/ provider Do you need transportation to your follow-up appointment?: No Do you understand care options if your condition(s) worsen?: Yes-patient verbalized understanding  SDOH Interventions Today    Flowsheet Row Most Recent Value  SDOH Interventions   Food Insecurity Interventions Intervention Not Indicated  Transportation Interventions Intervention Not Indicated  [spouse provides transportation,  son assists as needed]      TOC Interventions Today    Flowsheet Row Most Recent Value  TOC Interventions   TOC Interventions Discussed/Reviewed TOC Interventions Discussed, Post op wound/incision care      Interventions Today    Flowsheet Row Most Recent Value  Chronic Disease   Chronic disease during today's visit Congestive Heart Failure (CHF), Other  [bradycardia with permanent pacemaker insertion]  General Interventions   General Interventions Discussed/Reviewed General Interventions Discussed, Doctor Visits, Durable Medical Equipment (DME)  Doctor Visits Discussed/Reviewed Specialist, Doctor Visits Discussed  Durable Medical Equipment (DME) Other  [confirmed not currently requiring/ using assistive devices - uses cane as needed/ indicated]  PCP/Specialist Visits Compliance with follow-up visit  Education Interventions   Education Provided Provided Education  Provided Verbal Education On Other  [reinforced rationale for daily weight monitoring at home along with weight gain guidelines/  action plan for weight gain,  importance of taking diuretic as prescribed-- reports daily weights over weekend at "165 lbs" (at goal)]  Nutrition Interventions   Nutrition Discussed/Reviewed Nutrition Discussed  Pharmacy  Interventions   Pharmacy Dicussed/Reviewed Pharmacy Topics Discussed  [Full medication review with updating medication list in EHR per patient report]  Safety Interventions   Safety Discussed/Reviewed Safety Discussed, Fall Risk      Caryl Pina, RN, BSN, CCRN Alumnus RN CM Care Coordination/ Transition of Care- [facility] Care Management [phone]: direct office

## 2023-01-02 ENCOUNTER — Ambulatory Visit: Payer: Medicare HMO | Admitting: Endocrinology

## 2023-01-02 ENCOUNTER — Other Ambulatory Visit: Payer: Self-pay | Admitting: Endocrinology

## 2023-01-08 ENCOUNTER — Other Ambulatory Visit: Payer: Self-pay

## 2023-01-10 ENCOUNTER — Ambulatory Visit: Payer: Medicare HMO | Admitting: Vascular Surgery

## 2023-01-10 ENCOUNTER — Ambulatory Visit (HOSPITAL_COMMUNITY): Payer: Medicare HMO

## 2023-01-11 ENCOUNTER — Ambulatory Visit: Payer: Medicare HMO

## 2023-01-11 DIAGNOSIS — I5020 Unspecified systolic (congestive) heart failure: Secondary | ICD-10-CM | POA: Diagnosis not present

## 2023-01-11 NOTE — Patient Instructions (Signed)

## 2023-01-12 NOTE — Progress Notes (Signed)
Wound check appointment. Steri-strips removed. Wound without redness or edema. Incision edges approximated, wound well healed. Normal device function. Thresholds, sensing, and impedances consistent with implant measurements. Device programmed at 3.5V/auto capture programmed on for extra safety margin until 3 month visit. Histogram distribution appropriate for patient and level of activity. No mode switches or high ventricular rates noted. Patient educated about wound care, arm mobility, lifting restrictions. ROV in 3 months with implanting physician. 

## 2023-01-17 ENCOUNTER — Other Ambulatory Visit: Payer: Self-pay

## 2023-01-17 ENCOUNTER — Other Ambulatory Visit (HOSPITAL_COMMUNITY): Payer: Self-pay

## 2023-01-18 ENCOUNTER — Ambulatory Visit (INDEPENDENT_AMBULATORY_CARE_PROVIDER_SITE_OTHER)
Admission: RE | Admit: 2023-01-18 | Discharge: 2023-01-18 | Disposition: A | Discharge status: Home / Self Care | Payer: Medicare HMO | Source: Ambulatory Visit | Attending: Vascular Surgery | Admitting: Vascular Surgery

## 2023-01-18 ENCOUNTER — Ambulatory Visit (HOSPITAL_COMMUNITY)
Admission: RE | Admit: 2023-01-18 | Discharge: 2023-01-18 | Disposition: A | Discharge status: Home / Self Care | Payer: Medicare HMO | Source: Ambulatory Visit | Attending: Vascular Surgery | Admitting: Vascular Surgery

## 2023-01-18 ENCOUNTER — Other Ambulatory Visit: Payer: Self-pay | Admitting: Internal Medicine

## 2023-01-18 DIAGNOSIS — N185 Chronic kidney disease, stage 5: Secondary | ICD-10-CM | POA: Insufficient documentation

## 2023-01-29 ENCOUNTER — Other Ambulatory Visit: Payer: Self-pay | Admitting: Internal Medicine

## 2023-01-29 ENCOUNTER — Other Ambulatory Visit: Payer: Self-pay | Admitting: Cardiology

## 2023-01-29 DIAGNOSIS — I480 Paroxysmal atrial fibrillation: Secondary | ICD-10-CM

## 2023-01-29 NOTE — Telephone Encounter (Signed)
Eliquis 5mg  refill request received. Patient is 77 years old, weight-73.1kg, Crea- 2.57 on 12/29/22, Diagnosis-Afib, and last seen by Dr. Nelly Laurence on 12/25/22. Dose is appropriate based on dosing criteria. Will send in refill to requested pharmacy.

## 2023-01-31 ENCOUNTER — Other Ambulatory Visit (HOSPITAL_COMMUNITY): Payer: Self-pay

## 2023-01-31 ENCOUNTER — Telehealth: Payer: Self-pay

## 2023-01-31 NOTE — Telephone Encounter (Signed)
Following alert received from [employer] received for Device alert BiV pacing < threshold, effective CRT 84.4%, subtle downward trend LV1 - Can impedance 209 ohms, trend ~250.  Programmed LV4-LV1 Route to triage  Known AF, Eliquis per EPIC.  Dr. Nelly Laurence, would you like any adjustments made to device to promote Bi-V Pacing increase?

## 2023-02-03 ENCOUNTER — Encounter (HOSPITAL_COMMUNITY): Payer: Self-pay

## 2023-02-08 ENCOUNTER — Ambulatory Visit: Payer: Medicare HMO

## 2023-02-08 VITALS — Ht 66.0 in | Wt 165.0 lb

## 2023-02-08 DIAGNOSIS — Z0000 Encounter for general adult medical examination without abnormal findings: Secondary | ICD-10-CM | POA: Diagnosis not present

## 2023-02-08 NOTE — Patient Instructions (Addendum)
Corey [name] , Thank you for taking time to come for your Medicare Wellness Visit. I appreciate your ongoing commitment to your health goals. Please review the following plan we discussed and let me know if I can assist you in the future.   Referrals/Orders/Follow-Ups/Clinician Recommendations:   This is a list of the screening recommended for you and due dates:  Health Maintenance  Topic Date Due   Zoster (Shingles) Vaccine (1 of 2) Never done   Eye exam for diabetics  05/28/2019   DTaP/Tdap/Td vaccine (3 - Td or Tdap) 09/20/2021   Complete foot exam   04/27/2022   Flu Shot  12/14/2022   COVID-19 Vaccine (4 - 2023-24 season) 01/14/2023   Hemoglobin A1C     Medicare Annual Wellness Visit  02/08/2024   Colon Cancer Screening  06/21/2025   Pneumonia Vaccine  Completed   Hepatitis C Screening  Completed   HPV Vaccine  Aged Out    Advanced directives: (Declined) Advance directive discussed with you today. Even though you declined this today, please call our office should you change your mind, and we can give you the proper paperwork for you to fill out.  Next Medicare Annual Wellness Visit scheduled for next year: Yes

## 2023-02-08 NOTE — Progress Notes (Signed)
Subjective:   Corey [name] is a 77 y.o. male who presents for Medicare Annual/Subsequent preventive examination.  Visit Complete: Virtual  I connected with  Lyndel Pleasure on 02/08/23 by a audio enabled telemedicine application and verified that I am speaking with the correct person using two identifiers.  Patient Location: Home  Provider Location: Home Office  I discussed the limitations of evaluation and management by telemedicine. The patient expressed understanding and agreed to proceed.   Because this visit was a virtual/telehealth visit, some criteria may be missing or patient reported. Any vitals not documented were not able to be obtained and vitals that have been documented are patient reported.   Cardiac Risk Factors include: advanced age (>35men, >64 women);male gender;diabetes mellitus;hypertension     Objective:    Today's Vitals   02/08/23 1003  Weight: 165 lb (74.8 kg)  Height: 5\' 6"  (1.676 m)   Body mass index is 26.63 kg/m.     02/08/2023   10:15 AM 12/26/2022   12:59 PM 09/04/2022   10:36 AM 06/23/2022    8:47 AM 05/24/2022   10:59 AM 04/28/2022    8:33 AM 03/23/2022    4:38 PM  Advanced Directives  Does Patient Have a Medical Advance Directive? No No No  No No No  Would patient like information on creating a medical advance directive? No - Patient declined Yes (MAU/Ambulatory/Procedural Areas - Information given) No - Patient declined No - Patient declined No - Patient declined Yes (MAU/Ambulatory/Procedural Areas - Information given) No - Patient declined    Current Medications (verified) Outpatient Encounter Medications as of 02/08/2023  Medication Sig   acetaminophen (TYLENOL) 500 MG tablet Take 1,000 mg by mouth as needed for moderate pain.   albuterol (VENTOLIN HFA) 108 (90 Base) MCG/ACT inhaler Inhale 2 puffs into the lungs every 4 (four) hours as needed for wheezing or shortness of breath.   allopurinol (ZYLOPRIM) 100 MG tablet Take 1/2 (one-half)  tablet by mouth once daily   apixaban (ELIQUIS) 5 MG TABS tablet Take 1 tablet by mouth twice daily   bismuth subsalicylate (PEPTO BISMOL) 262 MG/15ML suspension Take 30 mLs by mouth every 6 (six) hours as needed for indigestion.   cholecalciferol (VITAMIN D3) 25 MCG (1000 UNIT) tablet Take 1,000 Units by mouth daily.   colchicine (COLCRYS) 0.6 MG tablet Take 2 tabs once and then one tab one hour later as needed for gout (Patient taking differently: Take 0.6 mg by mouth as needed (gout).)   glucose blood (ACCU-CHEK AVIVA PLUS) test strip Use to check blood glucose 4 times a day.   hydrALAZINE (APRESOLINE) 25 MG tablet TAKE 1 TABLET THREE TIMES DAILY   rosuvastatin (CRESTOR) 5 MG tablet Take 1 tablet (5 mg total) by mouth daily.   sitaGLIPtin (JANUVIA) 50 MG tablet Take 0.5 tablets (25 mg total) by mouth daily.   sodium bicarbonate 650 MG tablet TAKE 1 TABLET BY MOUTH TWICE DAILY . APPOINTMENT REQUIRED FOR FUTURE REFILLS   Tafamidis (VYNDAMAX) 61 MG CAPS Take 1 capsule (61 mg total) by mouth daily.   torsemide (DEMADEX) 20 MG tablet Take 40 mg by mouth 2 (two) times daily.   TRUEplus Lancets 33G MISC USE 4 (FOUR) TIMES DAILY   No facility-administered encounter medications on file as of 02/08/2023.    Allergies (verified) Hydrochlorothiazide   History: Past Medical History:  Diagnosis Date   Cataract    CHF (congestive heart failure) (HCC)    Chronic heart failure with preserved ejection  fraction (HFpEF) (HCC)    Colon polyp 2003   Dr Victorino Dike; F/U was to be 2008( not completed)   Coronary artery disease    CVA (cerebral infarction) 2014   Diabetes mellitus    Diverticulosis 2003   Dyspnea    with exertion   Gout    Hereditary cardiac amyloidosis (HCC)    Hypertension    Myocardial infarction (HCC) 09/19/2012   PEA cardiac arrest in setting of acute respiratory failure/pulmonary edema   OSA (obstructive sleep apnea) 03/27/2018   Pneumonia 09/29/2011   Avelox X 10 days as OP    Prostate cancer (HCC)    Renal insufficiency    Seizures (HCC) 09/19/2012   not treated for seizure disorder; had a seizure after stroke 2014; no seizure since then   Stroke [facility])    Past Surgical History:  Procedure Laterality Date   A-FLUTTER ABLATION N/A 06/23/2022   Procedure: A-FLUTTER ABLATION;  Surgeon: Maurice Small, MD;  Location: [facility] INVASIVE CV LAB;  Service: Cardiovascular;  Laterality: N/A;   A/V FISTULAGRAM Left 04/28/2022   Procedure: A/V Fistulagram;  Surgeon: Chuck Hint, MD;  Location: [facility] INVASIVE CV LAB;  Service: Cardiovascular;  Laterality: Left;   AV FISTULA PLACEMENT Left 01/31/2022   Procedure: LEFT ARM ARTERIOVENOUS (AV) FISTULA CREATION;  Surgeon: Maeola Harman, MD;  Location: [facility] OR;  Service: Vascular;  Laterality: Left;  regional block to left arm   CARDIOVERSION N/A 03/24/2022   Procedure: CARDIOVERSION;  Surgeon: Dorthula Nettles, DO;  Location: [facility] ENDOSCOPY;  Service: Cardiovascular;  Laterality: N/A;   COLONOSCOPY W/ POLYPECTOMY  2003   no F/U (SOC discussed 12/03/12)   INGUINAL HERNIA REPAIR Left 06/16/2021   Procedure: LAPAROSCOPIC, POSSIBLY OPEN LEFT INGUINAL HERNIA REPAIR WITH MESH;  Surgeon: Quentin Ore, MD;  Location: [facility] ORS;  Service: General;  Laterality: Left;   INSERTION OF MESH N/A 07/16/2017   Procedure: INSERTION OF MESH;  Surgeon: Berna Bue, MD;  Location: [facility] OR;  Service: General;  Laterality: N/A;   PACEMAKER IMPLANT N/A 12/28/2022   Procedure: PACEMAKER IMPLANT;  Surgeon: Maurice Small, MD;  Location: [facility] INVASIVE CV LAB;  Service: Cardiovascular;  Laterality: N/A;   PEG PLACEMENT  2014   10/08/12-12/10/12   PEG TUBE REMOVAL  2014   PERIPHERAL VASCULAR BALLOON ANGIOPLASTY  04/28/2022   Procedure: PERIPHERAL VASCULAR BALLOON ANGIOPLASTY;  Surgeon: Chuck Hint, MD;  [facility] INVASIVE CV LAB;  Service: Cardiovascular;;   PROSTATE BIOPSY     RIGHT HEART CATH N/A 03/24/2022    Procedure: RIGHT HEART CATH;  Surgeon: Dorthula Nettles, DO;  Location: [facility] INVASIVE CV LAB;  Service: Cardiovascular;  Laterality: N/A;   RIGHT/LEFT HEART CATH AND CORONARY ANGIOGRAPHY N/A 09/20/2020   Procedure: RIGHT/LEFT HEART CATH AND CORONARY ANGIOGRAPHY;  Surgeon: Marykay Lex, MD;  Location: [facility] INVASIVE CV LAB;  Service: Cardiovascular;  Laterality: N/A;   SUBQ ICD IMPLANT N/A 09/04/2022   Procedure: SUBQ ICD IMPLANT;  Surgeon: Maurice Small, MD;  Location: [facility] INVASIVE CV LAB;  Service: Cardiovascular;  Laterality: N/A;   TEE WITHOUT CARDIOVERSION N/A 09/27/2020   Procedure: TRANSESOPHAGEAL ECHOCARDIOGRAM (TEE);  Surgeon: Linden Dolin, MD;  Location: [facility] OR;  Service: Thoracic;  Laterality: N/A;   TEE WITHOUT CARDIOVERSION N/A 03/24/2022   Procedure: TRANSESOPHAGEAL ECHOCARDIOGRAM (TEE);  Surgeon: Dorthula Nettles, DO;  Location: [facility] ENDOSCOPY;  Service: Cardiovascular;  Laterality: N/A;   TEE WITHOUT CARDIOVERSION N/A 06/23/2022   Procedure: TRANSESOPHAGEAL ECHOCARDIOGRAM (TEE);  Surgeon:  Mealor, Roberts Gaudy, MD;  Location: [facility] INVASIVE CV LAB;  Service: Cardiovascular;  Laterality: N/A;   TRACHEOSTOMY  2014   09/30/12-10/20/12   VENTRAL HERNIA REPAIR N/A 07/16/2017   Procedure: LAPAROSCOPIC VENTRAL HERNIA REPAIR WITH MESH;  Surgeon: Berna Bue, MD;  Location: [facility] OR;  Service: General;  Laterality: N/A;   wrist aspiration  02/16/2012    monosodium urate crystals; Dr Melvyn Novas   Family History  Problem Relation Age of Onset   Heart failure Mother    Hypertension Mother    Diabetes Mother    Hypertension Father    Prostate cancer Father    Diabetes Father    Cancer Father    Heart failure Brother    Stroke Neg Hx    Heart disease Neg Hx    Colon cancer Neg Hx    Social History   Socioeconomic History   Marital status: Married    Spouse name: [name]   Number of children: 2   Years of education: 12   Highest education level: 12th grade  Occupational History    Not on file  Tobacco Use   Smoking status: Former    Current packs/day: 0.00    Average packs/day: 0.3 packs/day for 1 year (0.3 ttl pk-yrs)    Types: Cigarettes    Start date: 05/16/1967    Quit date: 05/15/1968    Years since quitting: 54.7   Smokeless tobacco: Never   Tobacco comments:    smoked 1969- 1970, up to 1-2 cigarettes/ day  Vaping Use   Vaping status: Never Used  Substance and Sexual Activity   Alcohol use: No    Alcohol/week: 0.0 standard drinks of alcohol   Drug use: No   Sexual activity: Yes  Other Topics Concern   Not on file  Social History Narrative   Walks three times a week, 2-3 miles, occasional walks on treadmill       Patient is right-handed. He lives with his wife in a one level home. He drinks 1 diet Mt. Dew a day and rarely has coffee. He walks 3 x a week for exercise.   Social Determinants of Health   Financial Resource Strain: Low Risk  (02/08/2023)   Overall Financial Resource Strain (CARDIA)    Difficulty of Paying Living Expenses: Not hard at all  Food Insecurity: No Food Insecurity (02/08/2023)   Hunger Vital Sign    Worried About Running Out of Food in the Last Year: Never true    Ran Out of Food in the Last Year: Never true  Transportation Needs: No Transportation Needs (02/08/2023)   PRAPARE - Administrator, Civil Service (Medical): No    Lack of Transportation (Non-Medical): No  Physical Activity: Insufficiently Active (02/08/2023)   Exercise Vital Sign    Days of Exercise per Week: 2 days    Minutes of Exercise per Session: 20 min  Stress: No Stress Concern Present (02/08/2023)   [employer] of Occupational Health - Occupational Stress Questionnaire    Feeling of Stress : Not at all  Social Connections: Socially Integrated (02/08/2023)   Social Connection and Isolation Panel [NHANES]    Frequency of Communication with Friends and Family: More than three times a week    Frequency of Social Gatherings with Friends and  Family: More than three times a week    Attends Religious Services: More than 4 times per year    Active Member of [employer] or Organizations: Yes    Attends [employer]  or Organization Meetings: More than 4 times per year    Marital Status: Married    Tobacco Counseling Counseling given: Not Answered Tobacco comments: smoked 1969- 1970, up to 1-2 cigarettes/ day   Clinical Intake:  Pre-visit preparation completed: Yes  Pain : No/denies pain     Nutritional Status: BMI 25 -29 Overweight Nutritional Risks: None Diabetes: Yes CBG done?: No Did pt. bring in CBG monitor from home?: No  How often do you need to have someone help you when you read instructions, pamphlets, or other written materials from your doctor or pharmacy?: 1 - Never  Interpreter Needed?: No  Information entered by :: Theresa Mulligan LPN   Activities of Daily Living    02/08/2023   10:12 AM 12/26/2022    7:00 PM  In your present state of health, do you have any difficulty performing the following activities:  Hearing? 1 0  Comment Wears hearing aids   Vision? 0 0  Difficulty concentrating or making decisions? 0 0  Walking or climbing stairs? 0 1  Dressing or bathing? 0 0  Doing errands, shopping? 0 0  Preparing Food and eating ? N   Using the Toilet? N   In the past six months, have you accidently leaked urine? Y   Comment Wears pads. Followed by Urologist   Do you have problems with loss of bowel control? N   Managing your Medications? N   Managing your Finances? N   Housekeeping or managing your Housekeeping? N     Patient Care Team: Pincus Sanes, MD as PCP - General (Internal Medicine) Jake Bathe, MD as PCP - Cardiology (Cardiology) Mealor, Roberts Gaudy, MD as PCP - Electrophysiology (Cardiology) Kathyrn Sheriff, [facility] (Inactive) as Pharmacist (Pharmacist)  Indicate any recent Medical Services you may have received from other than [facility] providers in the past year (date may be approximate).      Assessment:   This is a routine wellness examination for [place].  Hearing/Vision screen Hearing Screening - Comments:: Wears hearing aids Vision Screening - Comments:: Wears rx glasses - up to date with routine eye exams with  [facility]   Goals Addressed               This Visit's Progress     Stay Active! (pt-stated)         Depression Screen    02/08/2023   10:11 AM 04/10/2022   10:57 AM 02/02/2022    3:16 PM 11/04/2020    2:00 PM 07/30/2020   10:57 AM 06/12/2019    9:51 AM 07/23/2018   10:59 AM  PHQ 2/9 Scores  PHQ - 2 Score 0 1 0 0 0 0 0    Fall Risk    02/08/2023   10:13 AM 04/10/2022   10:57 AM 02/02/2022    3:05 PM 12/22/2021    2:40 PM 11/18/2021    1:15 PM  Fall Risk   Falls in the past year? 0 0 0 0 0  Comment    continues to deny new/ recent falls x last 12 months; not currently using assistive devices continues to deny falls x 12 months; not currently using assistive devices  Number falls in past yr: 0 0 0 0 0  Injury with Fall? 0 0 0 0 0  Comment    N/A- no falls reported x last 12 months N/A- no falls reported x 12 months  Risk for fall due to : No Fall Risks No Fall  Risks Medication side effect;Other (Comment) Medication side effect;Other (Comment) Medication side effect;Other (Comment)  Risk for fall due to: Comment   wound on right leg per patient ongoing (R) LE wound intermittent lower extremity swelling  Follow up Falls prevention discussed Falls evaluation completed Falls prevention discussed Falls prevention discussed Falls prevention discussed    MEDICARE RISK AT HOME: Medicare Risk at Home Any stairs in or around the home?: No If so, are there any without handrails?: No Home free of loose throw rugs in walkways, pet beds, electrical cords, etc?: Yes Adequate lighting in your home to reduce risk of falls?: Yes Life alert?: No Use of a cane, walker or w/c?: No Grab bars in the bathroom?: No Shower chair or bench in shower?: No Elevated  toilet seat or a handicapped toilet?: No  TIMED UP AND GO:  Was the test performed?  No    Cognitive Function:        02/08/2023   10:15 AM 02/02/2022    3:21 PM  6CIT Screen  What Year? 0 points 0 points  What month? 0 points 0 points  What time? 0 points 0 points  Count back from 20 4 points 0 points  Months in reverse 4 points 0 points  Repeat phrase 2 points 0 points  Total Score 10 points 0 points    Immunizations Immunization History  Administered Date(s) Administered   Fluad Quad(high Dose 65+) 03/05/2019, 01/28/2020, 02/08/2021, 04/10/2022   Influenza Split 03/02/2011   Influenza Whole 01/23/2013   Influenza, High Dose Seasonal PF 01/28/2016, 01/23/2017, 01/21/2018   Influenza,inj,Quad PF,6+ Mos 02/23/2014, 02/24/2015   PFIZER(Purple Top)SARS-COV-2 Vaccination 07/08/2019, 07/29/2019, 02/07/2020   Pneumococcal Conjugate-13 05/04/2015   Pneumococcal Polysaccharide-23 10/02/2012, 01/21/2018   Tdap 12/15/2010, 09/21/2011    TDAP status: Due, Education has been provided regarding the importance of this vaccine. Advised may receive this vaccine at local pharmacy or Health Dept. Aware to provide a copy of the vaccination record if obtained from local pharmacy or Health Dept. Verbalized acceptance and understanding.  Flu Vaccine status: Due, Education has been provided regarding the importance of this vaccine. Advised may receive this vaccine at local pharmacy or Health Dept. Aware to provide a copy of the vaccination record if obtained from local pharmacy or Health Dept. Verbalized acceptance and understanding.  Pneumococcal vaccine status: Up to date  Covid-19 vaccine status: Declined, Education has been provided regarding the importance of this vaccine but patient still declined. Advised may receive this vaccine at local pharmacy or Health Dept.or vaccine clinic. Aware to provide a copy of the vaccination record if obtained from local pharmacy or Health Dept. Verbalized  acceptance and understanding.  Qualifies for Shingles Vaccine? Yes   Zostavax completed No   Shingrix Completed?: No.    Education has been provided regarding the importance of this vaccine. Patient has been advised to call insurance company to determine out of pocket expense if they have not yet received this vaccine. Advised may also receive vaccine at local pharmacy or Health Dept. Verbalized acceptance and understanding.  Screening Tests Health Maintenance  Topic Date Due   Zoster Vaccines- Shingrix (1 of 2) Never done   OPHTHALMOLOGY EXAM  05/28/2019   DTaP/Tdap/Td (3 - Td or Tdap) 09/20/2021   FOOT EXAM  04/27/2022   INFLUENZA VACCINE  12/14/2022   COVID-19 Vaccine (4 - 2023-24 season) 01/14/2023   HEMOGLOBIN A1C     Medicare Annual Wellness (AWV)  02/08/2024   Colonoscopy  06/21/2025   Pneumonia Vaccine  15+ Years old  Completed   Hepatitis C Screening  Completed   HPV VACCINES  Aged Out    Health Maintenance  Health Maintenance Due  Topic Date Due   Zoster Vaccines- Shingrix (1 of 2) Never done   OPHTHALMOLOGY EXAM  05/28/2019   DTaP/Tdap/Td (3 - Td or Tdap) 09/20/2021   FOOT EXAM  04/27/2022   INFLUENZA VACCINE  12/14/2022   COVID-19 Vaccine (4 - 2023-24 season) 01/14/2023    Colorectal cancer screening: Type of screening: Colonoscopy. Completed 06/22/15. Repeat every 10 years    Additional Screening:  Hepatitis C Screening: does qualify; Completed 09/22/12  Vision Screening: Recommended annual ophthalmology exams for early detection of glaucoma and other disorders of the eye. Is the patient up to date with their annual eye exam?  Yes  Who is the provider or what is the name of the office in which the patient attends annual eye exams? [facility] If pt is not established with a provider, would they like to be referred to a provider to establish care? No .   Dental Screening: Recommended annual dental exams for proper oral hygiene  Diabetic Foot  Exam: Diabetic Foot Exam: Overdue, Pt has been advised about the importance in completing this exam. Pt is scheduled for diabetic foot exam on Followed by PCP.  Community Resource Referral / Chronic Care Management:  CRR required this visit?  No   CCM required this visit?  No     Plan:     I have personally reviewed and noted the following in the patient's chart:   Medical and social history Use of alcohol, tobacco or illicit drugs  Current medications and supplements including opioid prescriptions. Patient is not currently taking opioid prescriptions. Functional ability and status Nutritional status Physical activity Advanced directives List of other physicians Hospitalizations, surgeries, and ER visits in previous 12 months Vitals Screenings to include cognitive, depression, and falls Referrals and appointments  In addition, I have reviewed and discussed with patient certain preventive protocols, quality metrics, and best practice recommendations. A written personalized care plan for preventive services as well as general preventive health recommendations were provided to patient.     Tillie Rung, LPN   [id number]   After Visit Summary: (MyChart) Due to this being a telephonic visit, the after visit summary with patients personalized plan was offered to patient via MyChart   Nurse Notes: None

## 2023-02-13 ENCOUNTER — Other Ambulatory Visit (INDEPENDENT_AMBULATORY_CARE_PROVIDER_SITE_OTHER): Payer: Medicare HMO

## 2023-02-13 DIAGNOSIS — E1165 Type 2 diabetes mellitus with hyperglycemia: Secondary | ICD-10-CM

## 2023-02-13 LAB — GLUCOSE, RANDOM: Glucose, Bld: 111 mg/dL — ABNORMAL HIGH (ref 70–99)

## 2023-02-13 LAB — HEMOGLOBIN A1C: Hgb A1c MFr Bld: 6.1 % (ref 4.6–6.5)

## 2023-02-14 ENCOUNTER — Encounter: Payer: Self-pay | Admitting: Vascular Surgery

## 2023-02-14 ENCOUNTER — Ambulatory Visit (INDEPENDENT_AMBULATORY_CARE_PROVIDER_SITE_OTHER): Payer: Medicare HMO | Admitting: Vascular Surgery

## 2023-02-14 VITALS — BP 147/106 | HR 76 | Temp 98.2°F | Resp 20 | Ht 66.0 in | Wt 181.7 lb

## 2023-02-14 DIAGNOSIS — N184 Chronic kidney disease, stage 4 (severe): Secondary | ICD-10-CM

## 2023-02-14 LAB — FRUCTOSAMINE: Fructosamine: 332 umol/L — ABNORMAL HIGH (ref 0–285)

## 2023-02-14 NOTE — Progress Notes (Signed)
Patient ID: Corey [name], male   DOB: [birth year], 77 y.o.   MRN: [id number]  Reason for Consult: Follow-up   Referred by Pincus Sanes, MD  Subjective:     HPI:  Corey [name] is a 77 y.o. male history of chronic kidney disease accompanied by his wife today states he is getting closer to dialysis.  He has a previous history of cephalic vein AV fistula on the left.  He is right-hand dominant with swelling on the right.  There is good he lost low in the fistula which was noted by his nephrologist recently and he is here for new evaluation.  He has never been on dialysis in the past.  Left upper extremity fistula was his only previous access.  Past Medical History:  Diagnosis Date   Cataract    CHF (congestive heart failure) (HCC)    Chronic heart failure with preserved ejection fraction (HFpEF) (HCC)    Colon polyp 2003   Dr Victorino Dike; F/U was to be 2008( not completed)   Coronary artery disease    CVA (cerebral infarction) 2014   Diabetes mellitus    Diverticulosis 2003   Dyspnea    with exertion   Gout    Hereditary cardiac amyloidosis (HCC)    Hypertension    Myocardial infarction (HCC) 09/19/2012   PEA cardiac arrest in setting of acute respiratory failure/pulmonary edema   OSA (obstructive sleep apnea) 03/27/2018   Pneumonia 09/29/2011   Avelox X 10 days as OP   Prostate cancer (HCC)    Renal insufficiency    Seizures (HCC) 09/19/2012   not treated for seizure disorder; had a seizure after stroke 2014; no seizure since then   Stroke [facility])    Family History  Problem Relation Age of Onset   Heart failure Mother    Hypertension Mother    Diabetes Mother    Hypertension Father    Prostate cancer Father    Diabetes Father    Cancer Father    Heart failure Brother    Stroke Neg Hx    Heart disease Neg Hx    Colon cancer Neg Hx    Past Surgical History:  Procedure Laterality Date   A-FLUTTER ABLATION N/A 06/23/2022   Procedure: A-FLUTTER ABLATION;  Surgeon:  Mealor, Roberts Gaudy, MD;  Location: [facility] INVASIVE CV LAB;  Service: Cardiovascular;  Laterality: N/A;   A/V FISTULAGRAM Left 04/28/2022   Procedure: A/V Fistulagram;  Surgeon: Chuck Hint, MD;  Location: [facility] INVASIVE CV LAB;  Service: Cardiovascular;  Laterality: Left;   AV FISTULA PLACEMENT Left 01/31/2022   Procedure: LEFT ARM ARTERIOVENOUS (AV) FISTULA CREATION;  Surgeon: Maeola Harman, MD;  Location: [facility] OR;  Service: Vascular;  Laterality: Left;  regional block to left arm   CARDIOVERSION N/A 03/24/2022   Procedure: CARDIOVERSION;  Surgeon: Dorthula Nettles, DO;  Location: [facility] ENDOSCOPY;  Service: Cardiovascular;  Laterality: N/A;   COLONOSCOPY W/ POLYPECTOMY  2003   no F/U (SOC discussed 12/03/12)   INGUINAL HERNIA REPAIR Left 06/16/2021   Procedure: LAPAROSCOPIC, POSSIBLY OPEN LEFT INGUINAL HERNIA REPAIR WITH MESH;  Surgeon: Quentin Ore, MD;  Location: [facility] ORS;  Service: General;  Laterality: Left;   INSERTION OF MESH N/A 07/16/2017   Procedure: INSERTION OF MESH;  Surgeon: Berna Bue, MD;  Location: [facility] OR;  Service: General;  Laterality: N/A;   PACEMAKER IMPLANT N/A 12/28/2022   Procedure: PACEMAKER IMPLANT;  Surgeon: Maurice Small, MD;  Location: [facility] INVASIVE  CV LAB;  Service: Cardiovascular;  Laterality: N/A;   PEG PLACEMENT  2014   10/08/12-12/10/12   PEG TUBE REMOVAL  2014   PERIPHERAL VASCULAR BALLOON ANGIOPLASTY  04/28/2022   Procedure: PERIPHERAL VASCULAR BALLOON ANGIOPLASTY;  Surgeon: Chuck Hint, MD;  Location: [facility] INVASIVE CV LAB;  Service: Cardiovascular;;   PROSTATE BIOPSY     RIGHT HEART CATH N/A 03/24/2022   Procedure: RIGHT HEART CATH;  Surgeon: Dorthula Nettles, DO;  Location: [facility] INVASIVE CV LAB;  Service: Cardiovascular;  Laterality: N/A;   RIGHT/LEFT HEART CATH AND CORONARY ANGIOGRAPHY N/A 09/20/2020   Procedure: RIGHT/LEFT HEART CATH AND CORONARY ANGIOGRAPHY;  Surgeon: Marykay Lex, MD;  Location: [facility] INVASIVE CV LAB;   Service: Cardiovascular;  Laterality: N/A;   SUBQ ICD IMPLANT N/A 09/04/2022   Procedure: SUBQ ICD IMPLANT;  Surgeon: Maurice Small, MD;  Location: [facility] INVASIVE CV LAB;  Service: Cardiovascular;  Laterality: N/A;   TEE WITHOUT CARDIOVERSION N/A 09/27/2020   Procedure: TRANSESOPHAGEAL ECHOCARDIOGRAM (TEE);  Surgeon: Linden Dolin, MD;  Location: [facility] OR;  Service: Thoracic;  Laterality: N/A;   TEE WITHOUT CARDIOVERSION N/A 03/24/2022   Procedure: TRANSESOPHAGEAL ECHOCARDIOGRAM (TEE);  Surgeon: Dorthula Nettles, DO;  Location: [facility] ENDOSCOPY;  Service: Cardiovascular;  Laterality: N/A;   TEE WITHOUT CARDIOVERSION N/A 06/23/2022   Procedure: TRANSESOPHAGEAL ECHOCARDIOGRAM (TEE);  Surgeon: Maurice Small, MD;  Location: [facility] INVASIVE CV LAB;  Service: Cardiovascular;  Laterality: N/A;   TRACHEOSTOMY  2014   09/30/12-10/20/12   VENTRAL HERNIA REPAIR N/A 07/16/2017   Procedure: LAPAROSCOPIC VENTRAL HERNIA REPAIR WITH MESH;  Surgeon: Berna Bue, MD;  Location: [facility] OR;  Service: General;  Laterality: N/A;   wrist aspiration  02/16/2012    monosodium urate crystals; Dr Reather Converse Social History:  Social History   Tobacco Use   Smoking status: Former    Current packs/day: 0.00    Average packs/day: 0.3 packs/day for 1 year (0.3 ttl pk-yrs)    Types: Cigarettes    Start date: 05/16/1967    Quit date: 05/15/1968    Years since quitting: 54.7   Smokeless tobacco: Never   Tobacco comments:    smoked 1969- 1970, up to 1-2 cigarettes/ day  Substance Use Topics   Alcohol use: No    Alcohol/week: 0.0 standard drinks of alcohol    Allergies  Allergen Reactions   Hydrochlorothiazide Other (See Comments)    Gout , uncontrolled diabetes and renal insufficiency    Current Outpatient Medications  Medication Sig Dispense Refill   acetaminophen (TYLENOL) 500 MG tablet Take 1,000 mg by mouth as needed for moderate pain.     albuterol (VENTOLIN HFA) 108 (90 Base) MCG/ACT inhaler Inhale 2  puffs into the lungs every 4 (four) hours as needed for wheezing or shortness of breath. 18 g 1   allopurinol (ZYLOPRIM) 100 MG tablet Take 1/2 (one-half) tablet by mouth once daily 45 tablet 0   apixaban (ELIQUIS) 5 MG TABS tablet Take 1 tablet by mouth twice daily 60 tablet 5   bismuth subsalicylate (PEPTO BISMOL) 262 MG/15ML suspension Take 30 mLs by mouth every 6 (six) hours as needed for indigestion.     cholecalciferol (VITAMIN D3) 25 MCG (1000 UNIT) tablet Take 1,000 Units by mouth daily.     colchicine (COLCRYS) 0.6 MG tablet Take 2 tabs once and then one tab one hour later as needed for gout (Patient taking differently: Take 0.6 mg by mouth as needed (gout).) 15 tablet 2  glucose blood (ACCU-CHEK AVIVA PLUS) test strip Use to check blood glucose 4 times a day. 400 strip 2   hydrALAZINE (APRESOLINE) 25 MG tablet TAKE 1 TABLET THREE TIMES DAILY 90 tablet 11   rosuvastatin (CRESTOR) 5 MG tablet Take 1 tablet (5 mg total) by mouth daily. 90 tablet 3   sitaGLIPtin (JANUVIA) 50 MG tablet Take 0.5 tablets (25 mg total) by mouth daily. 30 tablet 0   sodium bicarbonate 650 MG tablet TAKE 1 TABLET BY MOUTH TWICE DAILY . APPOINTMENT REQUIRED FOR FUTURE REFILLS 60 tablet 0   Tafamidis (VYNDAMAX) 61 MG CAPS Take 1 capsule (61 mg total) by mouth daily. 30 capsule 12   torsemide (DEMADEX) 20 MG tablet Take 40 mg by mouth 2 (two) times daily.     TRUEplus Lancets 33G MISC USE 4 (FOUR) TIMES DAILY 400 each 2   No current facility-administered medications for this visit.    Review of Systems  Constitutional:  Constitutional negative. HENT: HENT negative.  Eyes: Eyes negative.  Cardiovascular: Cardiovascular negative.  GI: Gastrointestinal negative.  Musculoskeletal:       Right upper extremity swelling Skin: Skin negative.  Neurological: Neurological negative. Hematologic: Hematologic/lymphatic negative.  Psychiatric: Psychiatric negative.        Objective:  Objective   Vitals:    02/14/23 1055  BP: (!) 147/106  Pulse: 76  Resp: 20  Temp: 98.2 F (36.8 C)  SpO2: 90%  Weight: 181 lb 11.2 oz (82.4 kg)  Height: 5\' 6"  (1.676 m)   Body mass index is 29.33 kg/m.  Physical Exam HENT:     Head: Normocephalic.     Mouth/Throat:     Mouth: Mucous membranes are moist.  Eyes:     Pupils: Pupils are equal, round, and reactive to light.  Cardiovascular:     Comments: Strong palpable left brachial pulse Abdominal:     General: Abdomen is flat.  Musculoskeletal:     Comments: Right upper extremity and hand swelling Left arm cephalic vein is palpable without any thrill or pulsatility  Skin:    General: Skin is warm.     Capillary Refill: Capillary refill takes less than 2 seconds.  Neurological:     General: No focal deficit present.     Mental Status: He is alert.  Psychiatric:        Mood and Affect: Mood normal.        Thought Content: Thought content normal.     Data: Right CephalicDiameter (cm)Depth (cm)Findings  +--------------+-------------+----------+--------+  Prox forearm                         thrombus  +--------------+-------------+----------+--------+  Mid forearm                          thrombus  +--------------+-------------+----------+--------+   +-----------------+-------------+----------+--------+  Right Basilic    Diameter (cm)Depth (cm)Findings  +-----------------+-------------+----------+--------+  Mid upper arm        0.28                         +-----------------+-------------+----------+--------+  Dist upper arm       0.12                         +-----------------+-------------+----------+--------+  Antecubital fossa  thrombus  +-----------------+-------------+----------+--------+   +-----------------+-------------+----------+--------+  Left Basilic     Diameter (cm)Depth (cm)Findings  +-----------------+-------------+----------+--------+  Mid upper arm         0.31                         +-----------------+-------------+----------+--------+  Dist upper arm       0.31                         +-----------------+-------------+----------+--------+  Antecubital fossa    0.23                         +-----------------+-------------+----------+--------+  Prox forearm         0.20                         +-----------------+-------------+----------+--------+   Summary: Right: Small cephalic vein where imaged with thickened walls and         areas of non-compressiblity due to chronic thrombus.         Chronic thrombus observed in the basilic vein near the         antecubital fossa.  Left: Thrombosed brachiocephalic AVF.        Patent basilic vein.      Assessment/Plan:     77 year old male with CKD 4 with thrombosed left brachial artery to cephalic vein AV fistula.  Will plan to remain in the left upper extremity and plan for left basilic vein versus more likely graft placement in the near future as patient is nearing dialysis according to his wife today.  All questions answered they demonstrate good understanding.     Maeola Harman MD [facility]

## 2023-02-16 ENCOUNTER — Other Ambulatory Visit (HOSPITAL_COMMUNITY): Payer: Self-pay

## 2023-02-16 ENCOUNTER — Other Ambulatory Visit: Payer: Self-pay

## 2023-02-16 NOTE — Progress Notes (Signed)
Specialty Pharmacy Initial Fill Coordination Note  Corey [name] is a 77 y.o. male contacted today regarding refills of specialty medication(s) Tafamidis   Patient requested Delivery   Delivery date: 02/20/23   Verified address: [address]   Medication will be filled on 10/7.   Patient is aware of $0 copayment. [employer] grant

## 2023-02-19 ENCOUNTER — Ambulatory Visit: Payer: Medicare HMO | Admitting: Podiatry

## 2023-02-20 ENCOUNTER — Telehealth: Payer: Self-pay | Admitting: *Deleted

## 2023-02-20 ENCOUNTER — Encounter: Payer: Self-pay | Admitting: Endocrinology

## 2023-02-20 ENCOUNTER — Telehealth: Payer: Self-pay | Admitting: Cardiology

## 2023-02-20 ENCOUNTER — Other Ambulatory Visit: Payer: Self-pay

## 2023-02-20 ENCOUNTER — Ambulatory Visit (INDEPENDENT_AMBULATORY_CARE_PROVIDER_SITE_OTHER): Payer: Medicare HMO | Admitting: Endocrinology

## 2023-02-20 VITALS — BP 117/90 | Ht 66.0 in | Wt 189.0 lb

## 2023-02-20 DIAGNOSIS — E1165 Type 2 diabetes mellitus with hyperglycemia: Secondary | ICD-10-CM | POA: Diagnosis not present

## 2023-02-20 DIAGNOSIS — N185 Chronic kidney disease, stage 5: Secondary | ICD-10-CM

## 2023-02-20 DIAGNOSIS — Z7984 Long term (current) use of oral hypoglycemic drugs: Secondary | ICD-10-CM

## 2023-02-20 NOTE — Telephone Encounter (Signed)
Pt c/o Shortness Of Breath: STAT if SOB developed within the last 24 hours or pt is noticeably SOB on the phone  1. Are you currently SOB (can you hear that pt is SOB on the phone)?   2. How long have you been experiencing SOB?  About 1-2 weeks  3. Are you SOB when sitting or when up moving around?  Both   4. Are you currently experiencing any other symptoms?   No

## 2023-02-20 NOTE — Telephone Encounter (Signed)
Pre-operative Risk Assessment    Patient Name: Corey [name]  DOB: 1945-10-08 MRN: [id number]    LAST O/V 8/12 NEXT O/V 03/27/23 SKAINS  BUT PT HAS A PHONE CALL IN FOR SOB TO TRIAGE  Request for Surgical Clearance    Procedure:   LEFT ARM ARTERIOVENOUS FISTULA VS/ ARTERIOVENOUS GRAFT CREATION  Date of Surgery:  Clearance 03/02/23                                 Surgeon:  DR. Lemar Livings Surgeon's Group or Practice Name:  [facility] Phone number:  [phone] Fax number:  [phone]   Type of Clearance Requested:   - Medical  - Pharmacy:  Hold Apixaban (Eliquis) X'S 48 HOURS   Type of Anesthesia:  MAC   Additional requests/questions:    Wilhemina Cash   02/20/2023, 11:03 AM

## 2023-02-20 NOTE — Patient Instructions (Signed)
Continue Januvia 1/2 tab daily.

## 2023-02-20 NOTE — Progress Notes (Signed)
Outpatient Endocrinology Note Corey Rajean Desantiago, MD  02/20/23  Patient's Name: Corey [name]    DOB: 1946-02-21    MRN: [id number]                                                    REASON OF VISIT: Follow-up of type 2 diabetes mellitus  PCP: Pincus Sanes, MD  HISTORY OF PRESENT ILLNESS:   Corey [name] is a 77 y.o. old male with past medical history listed below, is here for follow up of type 2 diabetes mellitus.   Pertinent Diabetes History: Patient was diagnosed with type 2 diabetes mellitus in 1997.  Patient was initially treated with various oral hypoglycemic drugs, no records available.  He was started on insulin therapy around 2009.  Insulin drip therapy was gradually tapered off.  Chronic Diabetes Complications : Retinopathy: no. Last ophthalmology exam was done on annually.  Nephropathy: CKD IV, on losartan/?, following with ? nephrology. Peripheral neuropathy: yes, foot deformities, on following with podiatry, uses diabetic shoes. Coronary artery disease: CAD Stroke: yes  Relevant comorbidities and cardiovascular risk factors: Obesity: yes Body mass index is 30.51 kg/m.  Hypertension: yes Hyperlipidemia. Yes, on statin  Current / Home Diabetic regimen includes: Januvia 25 mg daily.   Prior diabetic medications:  Glycemic data:  Accu Check glucometer. Not able to download glucometer in the clinic, glucose directly reviewed from meter as follows: checking at different times of day.  - 117, 118, 120, 127, 136, 108, 130, 124, 196, 89, 164, 162, 95, 100, 217, 131, 171, 198, 159, 145, 130, 179, 143, 166, 135, 100.   Hypoglycemia: Patient has no hypoglycemic episodes. Patient has hypoglycemia awareness.  Factors modifying glucose control: 1.  Diabetic diet assessment: 2-3 meals a day.   2.  Staying active or exercising: none  3.  Medication compliance: compliant all of the time.  Interval history 02/20/23 Glucometer data as reviewed above.  Most of the blood sugar are  acceptable with no significant hyperglycemia and no hypoglycemia.  He has been taking Januvia 25 mg daily.  Recent hemoglobin A1c 6.1% acceptable.  Fructosamine is slightly elevated 332.  No other complaints today.   Latest Reference Range & Units 02/13/23 08:45  Fructosamine 0 - 285 umol/L 332 (H)  Glucose 70 - 99 mg/dL 660 (H)  Hemoglobin Y3K 4.6 - 6.5 % 6.1  (H): Data is abnormally high  REVIEW OF SYSTEMS As per history of present illness.   PAST MEDICAL HISTORY: Past Medical History:  Diagnosis Date   Cataract    CHF (congestive heart failure) (HCC)    Chronic heart failure with preserved ejection fraction (HFpEF) (HCC)    Colon polyp 2003   Dr Victorino Dike; F/U was to be 2008( not completed)   Coronary artery disease    CVA (cerebral infarction) 2014   Diabetes mellitus    Diverticulosis 2003   Dyspnea    with exertion   Gout    Hereditary cardiac amyloidosis (HCC)    Hypertension    Myocardial infarction (HCC) 09/19/2012   PEA cardiac arrest in setting of acute respiratory failure/pulmonary edema   OSA (obstructive sleep apnea) 03/27/2018   Pneumonia 09/29/2011   Avelox X 10 days as OP   Prostate cancer (HCC)    Renal insufficiency    Seizures (HCC) 09/19/2012  not treated for seizure disorder; had a seizure after stroke 2014; no seizure since then   Stroke [facility])     PAST SURGICAL HISTORY: Past Surgical History:  Procedure Laterality Date   A-FLUTTER ABLATION N/A 06/23/2022   Procedure: A-FLUTTER ABLATION;  Surgeon: Mealor, Roberts Gaudy, MD;  Location: [facility] INVASIVE CV LAB;  Service: Cardiovascular;  Laterality: N/A;   A/V FISTULAGRAM Left 04/28/2022   Procedure: A/V Fistulagram;  Surgeon: Chuck Hint, MD;  Location: [facility] INVASIVE CV LAB;  Service: Cardiovascular;  Laterality: Left;   AV FISTULA PLACEMENT Left 01/31/2022   Procedure: LEFT ARM ARTERIOVENOUS (AV) FISTULA CREATION;  Surgeon: Maeola Harman, MD;  Location: [facility] OR;  Service: Vascular;   Laterality: Left;  regional block to left arm   CARDIOVERSION N/A 03/24/2022   Procedure: CARDIOVERSION;  Surgeon: Dorthula Nettles, DO;  Location: [facility] ENDOSCOPY;  Service: Cardiovascular;  Laterality: N/A;   COLONOSCOPY W/ POLYPECTOMY  2003   no F/U (SOC discussed 12/03/12)   INGUINAL HERNIA REPAIR Left 06/16/2021   Procedure: LAPAROSCOPIC, POSSIBLY OPEN LEFT INGUINAL HERNIA REPAIR WITH MESH;  Surgeon: Quentin Ore, MD;  Location: [facility] ORS;  Service: General;  Laterality: Left;   INSERTION OF MESH N/A 07/16/2017   Procedure: INSERTION OF MESH;  Surgeon: Berna Bue, MD;  Location: [facility] OR;  Service: General;  Laterality: N/A;   PACEMAKER IMPLANT N/A 12/28/2022   Procedure: PACEMAKER IMPLANT;  Surgeon: Maurice Small, MD;  Location: [facility] INVASIVE CV LAB;  Service: Cardiovascular;  Laterality: N/A;   PEG PLACEMENT  2014   10/08/12-12/10/12   PEG TUBE REMOVAL  2014   PERIPHERAL VASCULAR BALLOON ANGIOPLASTY  04/28/2022   Procedure: PERIPHERAL VASCULAR BALLOON ANGIOPLASTY;  Surgeon: Chuck Hint, MD;  Location: [facility] INVASIVE CV LAB;  Service: Cardiovascular;;   PROSTATE BIOPSY     RIGHT HEART CATH N/A 03/24/2022   Procedure: RIGHT HEART CATH;  Surgeon: Dorthula Nettles, DO;  Location: [facility] CV LAB;  Service: Cardiovascular;  Laterality: N/A;   RIGHT/LEFT HEART CATH AND CORONARY ANGIOGRAPHY N/A 09/20/2020   Procedure: RIGHT/LEFT HEART CATH AND CORONARY ANGIOGRAPHY;  Surgeon: Marykay Lex, MD;  Location: [facility] INVASIVE CV LAB;  Service: Cardiovascular;  Laterality: N/A;   SUBQ ICD IMPLANT N/A 09/04/2022   Procedure: SUBQ ICD IMPLANT;  Surgeon: Maurice Small, MD;  [facility]: [facility] CV LAB;  Service: Cardiovascular;  Laterality: N/A;   TEE WITHOUT CARDIOVERSION N/A 09/27/2020   Procedure: TRANSESOPHAGEAL ECHOCARDIOGRAM (TEE);  Surgeon: Linden Dolin, MD;  Location: [facility] OR;  Service: Thoracic;  Laterality: N/A;   TEE WITHOUT CARDIOVERSION N/A 03/24/2022    Procedure: TRANSESOPHAGEAL ECHOCARDIOGRAM (TEE);  Surgeon: Dorthula Nettles, DO;  Location: [facility] ENDOS[facility];  Service: Cardiovascular;  Laterality: N/A;   TEE WITHOUT CARDIOVERSION N/A 06/23/2022   Procedure: TRANSESOPHAGEAL ECHOCARDIOGRAM (TEE);  Surgeon: Maurice Small, MD;  Location: [facility] INVASIVE CV LAB;  Service: Cardiovascular;  Laterality: N/A;   TRACHEOSTOMY  2014   09/30/12-10/20/12   VENTRAL HERNIA REPAIR N/A 07/16/2017   Procedure: LAPAROSCOPIC VENTRAL HERNIA REPAIR WITH MESH;  Surgeon: Berna Bue, MD;  Location: [facility] OR;  Service: General;  Laterality: N/A;   wrist aspiration  02/16/2012    monosodium urate crystals; Dr Melvyn Novas    ALLERGIES: Allergies  Allergen Reactions   Hydrochlorothiazide Other (See Comments)    Gout , uncontrolled diabetes and renal insufficiency    FAMILY HISTORY:  Family History  Problem Relation Age of Onset   Heart failure Mother  Hypertension Mother    Diabetes Mother    Hypertension Father    Prostate cancer Father    Diabetes Father    Cancer Father    Heart failure Brother    Stroke Neg Hx    Heart disease Neg Hx    Colon cancer Neg Hx     SOCIAL HISTORY: Social History   Socioeconomic History   Marital status: Married    Spouse name: [name]   Number of children: 2   Years of education: 12   Highest education level: 12th grade  Occupational History   Not on file  Tobacco Use   Smoking status: Former    Current packs/day: 0.00    Average packs/day: 0.3 packs/day for 1 year (0.3 ttl pk-yrs)    Types: Cigarettes    Start date: 05/16/1967    Quit date: 05/15/1968    Years since quitting: 54.8   Smokeless tobacco: Never   Tobacco comments:    smoked 1969- 1970, up to 1-2 cigarettes/ day  Vaping Use   Vaping status: Never Used  Substance and Sexual Activity   Alcohol use: No    Alcohol/week: 0.0 standard drinks of alcohol   Drug use: No   Sexual activity: Yes  Other Topics Concern   Not on file  Social History  Narrative   Walks three times a week, 2-3 miles, occasional walks on treadmill       Patient is right-handed. He lives with his wife in a one level home. He drinks 1 diet Mt. Dew a day and rarely has coffee. He walks 3 x a week for exercise.   Social Determinants of Health   Financial Resource Strain: Low Risk  (02/08/2023)   Overall Financial Resource Strain (CARDIA)    Difficulty of Paying Living Expenses: Not hard at all  Food Insecurity: No Food Insecurity (02/08/2023)   Hunger Vital Sign    Worried About Running Out of Food in the Last Year: Never true    Ran Out of Food in the Last Year: Never true  Transportation Needs: No Transportation Needs (02/08/2023)   PRAPARE - Administrator, Civil Service (Medical): No    Lack of Transportation (Non-Medical): No  Physical Activity: Insufficiently Active (02/08/2023)   Exercise Vital Sign    Days of Exercise per Week: 2 days    Minutes of Exercise per Session: 20 min  Stress: No Stress Concern Present (02/08/2023)   [employer] of Occupational Health - Occupational Stress Questionnaire    Feeling of Stress : Not at all  Social Connections: Socially Integrated (02/08/2023)   Social Connection and Isolation Panel [NHANES]    Frequency of Communication with Friends and Family: More than three times a week    Frequency of Social Gatherings with Friends and Family: More than three times a week    Attends Religious Services: More than 4 times per year    Active Member of [employer] or Organizations: Yes    Attends Engineer, structural: More than 4 times per year    Marital Status: Married    MEDICATIONS:  Current Outpatient Medications  Medication Sig Dispense Refill   acetaminophen (TYLENOL) 500 MG tablet Take 1,000 mg by mouth as needed for moderate pain.     albuterol (VENTOLIN HFA) 108 (90 Base) MCG/ACT inhaler Inhale 2 puffs into the lungs every 4 (four) hours as needed for wheezing or shortness of breath. 18 g 1    allopurinol (ZYLOPRIM) 100 MG tablet  Take 1/2 (one-half) tablet by mouth once daily 45 tablet 0   apixaban (ELIQUIS) 5 MG TABS tablet Take 1 tablet by mouth twice daily 60 tablet 5   bismuth subsalicylate (PEPTO BISMOL) 262 MG/15ML suspension Take 30 mLs by mouth every 6 (six) hours as needed for indigestion.     cholecalciferol (VITAMIN D3) 25 MCG (1000 UNIT) tablet Take 1,000 Units by mouth daily.     colchicine (COLCRYS) 0.6 MG tablet Take 2 tabs once and then one tab one hour later as needed for gout (Patient taking differently: Take 0.6 mg by mouth as needed (gout).) 15 tablet 2   glucose blood (ACCU-CHEK AVIVA PLUS) test strip Use to check blood glucose 4 times a day. 400 strip 2   hydrALAZINE (APRESOLINE) 25 MG tablet TAKE 1 TABLET THREE TIMES DAILY 90 tablet 11   rosuvastatin (CRESTOR) 5 MG tablet Take 1 tablet (5 mg total) by mouth daily. 90 tablet 3   sitaGLIPtin (JANUVIA) 50 MG tablet Take 0.5 tablets (25 mg total) by mouth daily. 30 tablet 0   sodium bicarbonate 650 MG tablet TAKE 1 TABLET BY MOUTH TWICE DAILY . APPOINTMENT REQUIRED FOR FUTURE REFILLS 60 tablet 0   Tafamidis (VYNDAMAX) 61 MG CAPS Take 1 capsule (61 mg total) by mouth daily. 30 capsule 12   torsemide (DEMADEX) 20 MG tablet Take 40 mg by mouth 2 (two) times daily.     TRUEplus Lancets 33G MISC USE 4 (FOUR) TIMES DAILY 400 each 2   No current facility-administered medications for this visit.    PHYSICAL EXAM: Vitals:   02/20/23 0846  BP: (!) 117/90  Weight: 189 lb (85.7 kg)  Height: 5\' 6"  (1.676 m)   Body mass index is 30.51 kg/m.  Wt Readings from Last 3 Encounters:  02/20/23 189 lb (85.7 kg)  02/14/23 181 lb 11.2 oz (82.4 kg)  02/08/23 165 lb (74.8 kg)    General: Well developed, well nourished male in no apparent distress.  HEENT: AT/[place], no external lesions.  Eyes: Conjunctiva clear and no icterus. Neck: Neck supple  Lungs: Respirations not labored Neurologic: Alert, oriented, normal  speech Extremities / Skin: Dry.  Psychiatric: Does not appear depressed or anxious  Diabetic Foot Exam - Simple   No data filed    LABS Reviewed Lab Results  Component Value Date   HGBA1C 6.1 02/13/2023   HGBA1C 7.0 (H) 09/19/2022   HGBA1C 6.0 05/11/2022   Lab Results  Component Value Date   FRUCTOSAMINE 332 (H) 02/13/2023   FRUCTOSAMINE 404 (H) 09/19/2022   FRUCTOSAMINE 366 (H) 10/11/2021   Lab Results  Component Value Date   CHOL 86 03/23/2022   HDL 44 03/23/2022   LDLCALC 31 03/23/2022   TRIG 54 03/23/2022   CHOLHDL 2.0 03/23/2022   Lab Results  Component Value Date   MICRALBCREAT 10.2 11/23/2020   MICRALBCREAT 2.7 03/03/2020   Lab Results  Component Value Date   CREATININE 2.57 (H) 12/29/2022   Lab Results  Component Value Date   GFR 15.78 (L) 05/11/2022    ASSESSMENT / PLAN  1. Uncontrolled type 2 diabetes mellitus with hyperglycemia, without long-term current use of insulin (HCC)     Diabetes Mellitus type 2, complicated by neuropathy/CKD/CAD/stroke. - Diabetic status / severity: Fair control.  Mildly high fructosamine.  Lab Results  Component Value Date   HGBA1C 6.1 02/13/2023    - Hemoglobin A1c goal : <7%.  Most glucometer data are acceptable.  - Medications: No change.  I) continue  Januvia 25 mg daily.  - Home glucose testing: In the morning fasting. - Discussed/ Gave Hypoglycemia treatment plan.  # Consult : not required at this time.   # Annual urine for microalbuminuria/ creatinine ratio, no microalbuminuria currently, continue ACE/ARB /losartan.  He has CKD 4, following with ? nephrology. Last  Lab Results  Component Value Date   MICRALBCREAT 10.2 11/23/2020    # Foot check nightly / neuropathy, /foot deformity, following with podiatry on diabetic shoes.  # Annual dilated diabetic eye exams.   - Diet: Make healthy diabetic food choices  2. Blood pressure  -  BP Readings from Last 1 Encounters:  02/20/23 (!) 117/90    -  Control is in target.  - No change in current plans.  3. Lipid status / Hyperlipidemia - Last  Lab Results  Component Value Date   LDLCALC 31 03/23/2022   - Continue rosuvastatin 5 mg daily.  Lab for hemoglobin A1c, fructosamine, lipid panel and BMP 1 week prior to follow-up visit in 3 months.  Diagnoses and all orders for this visit:  Uncontrolled type 2 diabetes mellitus with hyperglycemia, without long-term current use of insulin (HCC) -     Lipid panel; Future -     Hemoglobin A1c; Future -     Fructosamine; Future -     Basic metabolic panel; Future    DISPOSITION Follow up in clinic in 3  months suggested.   All questions answered and patient verbalized understanding of the plan.  Corey Lynnsie Linders, MD [facility] Endocrinology [facility] [address] [place], [address] [place], [place] [address] Phone # [phone]  At least part of this note was generated using voice recognition software. Inadvertent word errors may have occurred, which were not recognized during the proofreading process.

## 2023-02-20 NOTE — Telephone Encounter (Signed)
Pharmacy please advise on holding Eliquis prior to   LEFT ARM ARTERIOVENOUS FISTULA VS/ ARTERIOVENOUS GRAFT CREATION  scheduled for 03/02/2023. . Thank you.

## 2023-02-20 NOTE — Telephone Encounter (Signed)
Remote transmission received. Normal device function. Patient BiV Pacing at 74 bpm. No episodes logged within the past week. Optivol appears normal. Lead trend data also appears normal. Patient/family on phone was updated with information, advised I someone will contact for follow up. Voiced understanding/appreciative for call.

## 2023-02-20 NOTE — Telephone Encounter (Signed)
Spoke with pt's wife,DPR who reports pt complaining of SOB on rest and exertion x 2 weeks. Pt denies edema or CP at this time  BP this morning at endocrinology was 117/90.  Weight has been stable.  Pt has Torsemide 20mg  prescribed as 2 tablets by mouth twice daily.  Pt's wife states they were advised by someone about a month ago to only take Torsemide 20mg  - 2 tablets once daily unless pt had visible edema.  Pt's wife states pt is scheduled to have a procedure on 03/02/2023 and will need clearance for this per [facility].  Pt advised to send a device transmission for review.  Pt will need assistance with this so will have device CMA contact pt to assist with this.   Will forward to Dr Anne Fu for review and further recommendation.  Spoke with Lanae Crumbly who states pre-op clearance has also been received this morning.

## 2023-02-21 ENCOUNTER — Other Ambulatory Visit (HOSPITAL_COMMUNITY): Payer: Medicare HMO

## 2023-02-21 ENCOUNTER — Encounter (HOSPITAL_COMMUNITY): Payer: Medicare HMO

## 2023-02-21 ENCOUNTER — Ambulatory Visit: Payer: Medicare HMO | Admitting: Vascular Surgery

## 2023-02-21 NOTE — Telephone Encounter (Signed)
1st attempt to reach pt regarding surgical clearance and the need for a TELEVISIT appointment.  Left pt a message to call back and get that scheduled.

## 2023-02-21 NOTE — Telephone Encounter (Addendum)
Patient with diagnosis of afib on Eliquis for anticoagulation.    Procedure: LEFT ARM ARTERIOVENOUS FISTULA VS/ ARTERIOVENOUS GRAFT CREATION  Date of procedure: 03/02/23   CHA2DS2-VASc Score = 6   This indicates a 9.7% annual risk of stroke. The patient's score is based upon: CHF History: 1 HTN History: 1 Diabetes History: 1 Stroke History: 2 Vascular Disease History: 0 Age Score: 1 Gender Score: 0    Pt with stroke in 2014  CrCl 25.88 Platelet count 159  Per office protocol, patient can hold Eliquis for 2 days prior to procedure.    **This guidance is not considered finalized until pre-operative APP has relayed final recommendations.**

## 2023-02-21 NOTE — Telephone Encounter (Addendum)
Name: Corey [name]  DOB: 10/29/1945  MRN: [id number]  Primary Cardiologist: Donato Schultz, MD   Preoperative team, please contact this patient and set up a phone call appointment for further preoperative risk assessment. Please obtain consent and complete medication review. Thank you for your help.  I confirm that guidance regarding antiplatelet and oral anticoagulation therapy has been completed and, if necessary, noted below.  Per office protocol, patient can hold Eliquis for 2 days prior to procedure.  Please resume Eliquis as soon as possible postprocedure, at the discretion of the surgeon.   I also confirmed the patient resides in the state of [place]. As per [facility] Medical Board telemedicine laws, the patient must reside in the state in which the provider is licensed.  ADDENDUM 02/21/2023: Per preoperative coverage team, pt with new symptoms of shortness of breath. Pt will need office visit prior to clearance.  Pre-op covering staff: - Please schedule appointment and call patient to inform them. If patient already had an upcoming appointment within acceptable timeframe, please add "pre-op clearance" to the appointment notes so provider is aware. - Please contact requesting surgeon's office via preferred method (i.e, phone, fax) to inform them of need for appointment prior to surgery.   Joylene Grapes, NP  02/21/2023, 3:51 PM

## 2023-02-21 NOTE — Telephone Encounter (Signed)
I s/w the pt's wife to schedule a tele pre op appt for the pt. She tells me that he has been SOB and has to sit down just walking from the bedroom to the bathroom. She tells me that he sleeps all day as well. I d/w the pre op APP Bernadene Person, NP who stated to change from tele appt to in office.   Pt has been scheduled to see Bernadene Person, NP 02/27/23 @ 2:45. I will update all parties involved.

## 2023-02-22 NOTE — Telephone Encounter (Signed)
Corey Bathe, MD  You; Lenor Coffin, RN; Alois Cliche, RN45 minutes ago (1:56 PM)    Okay to take torsemide 20 mg twice a day to see if this improves shortness of breath.    Spoke with wife Corey [name] and advise to increase Torsemide 20 mg BID.  She reports all he wants to do is sleep and now saying his feet hurt when he tried to walk.  Advised to decrease NA+ in diet, wear knee high compression stockings and elevate feet above the level of his heart as much as possible during the day.  Corey [name] states understanding and will call back if no improvement.  He is scheduled for pre-op clearance on Tuesday.

## 2023-02-24 ENCOUNTER — Telehealth: Payer: Self-pay | Admitting: Physician Assistant

## 2023-02-24 NOTE — Telephone Encounter (Signed)
Pt wife called in stating pt was falling - 2 different occasions. No LOC. She states he is losing his balance when walking to the bathroom. He is having shortness of breath and was instructed to increase torsemide by our office for fluid retention. She prefers to avoid ER evaluation. Pt denies chest pain and palpitations. Discussed he could be falling due to electrolyte abnormalities and AKI with increased torsemide. She does not want to go to the ER unless he has another episode. She would like a call back from Dr. Anne Fu on Monday.   Marcelino Duster, PA-C 02/24/2023, 9:32 AM [phone] [facility]

## 2023-02-26 ENCOUNTER — Encounter: Payer: Self-pay | Admitting: Podiatry

## 2023-02-26 ENCOUNTER — Ambulatory Visit (INDEPENDENT_AMBULATORY_CARE_PROVIDER_SITE_OTHER): Payer: Medicare HMO | Admitting: Podiatry

## 2023-02-26 DIAGNOSIS — E1151 Type 2 diabetes mellitus with diabetic peripheral angiopathy without gangrene: Secondary | ICD-10-CM

## 2023-02-26 DIAGNOSIS — M2041 Other hammer toe(s) (acquired), right foot: Secondary | ICD-10-CM | POA: Diagnosis not present

## 2023-02-26 DIAGNOSIS — M2042 Other hammer toe(s) (acquired), left foot: Secondary | ICD-10-CM

## 2023-02-26 DIAGNOSIS — B351 Tinea unguium: Secondary | ICD-10-CM

## 2023-02-26 DIAGNOSIS — M79674 Pain in right toe(s): Secondary | ICD-10-CM | POA: Diagnosis not present

## 2023-02-26 DIAGNOSIS — M79675 Pain in left toe(s): Secondary | ICD-10-CM | POA: Diagnosis not present

## 2023-02-26 DIAGNOSIS — E119 Type 2 diabetes mellitus without complications: Secondary | ICD-10-CM | POA: Diagnosis not present

## 2023-02-26 NOTE — Progress Notes (Unsigned)
66.0 in Accession #:    [id number]   Weight:       178.0 lb Date of Birth:  01/06/1946    BSA:          1.903 m Patient Age:    77 years     BP:           136/122 mmHg Patient Gender: M            HR:           48 bpm. Exam Location:  Inpatient  Procedure: 2D Echo, Cardiac Doppler, Color Doppler and Intracardiac Opacification Agent  Indications:    CHF-Acute Systolic I50.21  History:        Patient has prior history of Echocardiogram examinations, most recent 09/30/2020. CAD; Arrythmias:Bradycardia with first-degree block. Chronic systolic heart failure/cardiac  amyloidosis. History of pericardial effusion s/p pericardial window.  Sonographer:    Leta Jungling RDCS Referring Phys: [phone] ERIC CHEN  IMPRESSIONS   1. Speckled appearance to LV myocardium consistent with diagnosis of amyloid Global hypokinesis worse in the posterior lateral wall . Left ventricular ejection fraction, by estimation, is 30 to 35%. The left ventricle has moderately decreased function. The left ventricle demonstrates global hypokinesis. There is severe left ventricular hypertrophy. Left ventricular diastolic parameters are indeterminate. Elevated left ventricular end-diastolic pressure. 2. Right ventricular systolic function is normal. The right ventricular size is normal. There is moderately elevated pulmonary artery systolic pressure. 3. Left atrial size was moderately dilated. 4. Right atrial size was moderately dilated. 5. The mitral valve is normal in structure. Mild mitral valve regurgitation. No evidence of mitral stenosis. 6. The aortic valve is tricuspid. There is mild calcification of the aortic valve. There is mild thickening of the aortic valve. Aortic valve regurgitation is not visualized. Aortic valve sclerosis is present, with no evidence of aortic valve stenosis. 7. Aortic dilatation noted. There is moderate dilatation of the ascending aorta, measuring 41 mm. 8. The inferior vena cava is dilated in size with <50% respiratory variability, suggesting right atrial pressure of 15 mmHg.  FINDINGS Left Ventricle: Speckled appearance to LV myocardium consistent with diagnosis of amyloid Global hypokinesis worse in the posterior lateral wall. Left ventricular ejection fraction, by estimation, is 30 to 35%. The left ventricle has moderately decreased function. The left ventricle demonstrates global hypokinesis. Definity contrast agent was given IV to delineate the left ventricular endocardial borders. The left ventricular internal cavity size was normal in size. There is  severe left ventricular hypertrophy. Left ventricular diastolic parameters are indeterminate. Elevated left ventricular end-diastolic pressure.  Right Ventricle: The right ventricular size is normal. No increase in right ventricular wall thickness. Right ventricular systolic function is normal. There is moderately elevated pulmonary artery systolic pressure. The tricuspid regurgitant velocity is 2.86 m/s, and with an assumed right atrial pressure of 15 mmHg, the estimated right ventricular systolic pressure is 47.7 mmHg.  Left Atrium: Left atrial size was moderately dilated.  Right Atrium: Right atrial size was moderately dilated.  Pericardium: There is no evidence of pericardial effusion.  Mitral Valve: The mitral valve is normal in structure. Mild mitral valve regurgitation. No evidence of mitral valve stenosis.  Tricuspid Valve: The tricuspid valve is normal in structure. Tricuspid valve regurgitation is mild . No evidence of tricuspid stenosis.  Aortic Valve: The aortic valve is tricuspid. There is mild calcification of the aortic valve. There is mild thickening of the aortic valve. Aortic valve regurgitation is not visualized. Aortic valve sclerosis is present,  with no evidence of aortic valve stenosis.  Pulmonic Valve: The pulmonic valve was normal in structure. Pulmonic valve regurgitation is mild. No evidence of pulmonic stenosis.  Aorta: Aortic dilatation noted. There is moderate dilatation of the ascending aorta, measuring 41 mm.  Venous: The inferior vena cava is dilated in size with less than 50% respiratory variability, suggesting right atrial pressure of 15 mmHg.  IAS/Shunts: No atrial level shunt detected by color flow Doppler.   LEFT VENTRICLE PLAX 2D LVIDd:         4.70 cm      Diastology LVIDs:         3.60 cm      LV e' medial:    5.66 cm/s LV PW:         1.70 cm      LV E/e' medial:  18.9 LV IVS:        2.15 cm      LV e' lateral:   7.29 cm/s LVOT diam:     2.20  cm      LV E/e' lateral: 14.7 LV SV:         79 LV SV Index:   41 LVOT Area:     3.80 cm  LV Volumes (MOD) LV vol d, MOD A2C: 244.0 ml LV vol d, MOD A4C: 187.5 ml LV vol s, MOD A2C: 133.0 ml LV vol s, MOD A4C: 111.0 ml LV SV MOD A2C:     111.0 ml LV SV MOD A4C:     187.5 ml LV SV MOD BP:      92.3 ml  RIGHT VENTRICLE RV S prime:     5.00 cm/s TAPSE (M-mode): 1.6 cm  LEFT ATRIUM             Index        RIGHT ATRIUM           Index LA diam:        4.50 cm 2.36 cm/m   RA Area:     23.10 cm LA Vol (A2C):   87.3 ml 45.87 ml/m  RA Volume:   67.60 ml  35.52 ml/m LA Vol (A4C):   61.9 ml 32.52 ml/m LA Biplane Vol: 75.4 ml 39.62 ml/m AORTIC VALVE             PULMONIC VALVE LVOT Vmax:   103.00 cm/s PR End Diast Vel: 8.88 msec LVOT Vmean:  76.600 cm/s LVOT VTI:    0.207 m  AORTA Ao Root diam: 2.90 cm Ao Asc diam:  4.05 cm  MITRAL VALVE                TRICUSPID VALVE MV Area (PHT): 3.91 cm     TR Peak grad:   32.7 mmHg MV Decel Time: 194 msec     TR Vmax:        286.00 cm/s MV E velocity: 107.00 cm/s SHUNTS Systemic VTI:  0.21 m Systemic Diam: 2.20 cm  Charlton Haws MD Electronically signed by Charlton Haws MD Signature Date/Time: 03/23/2022/2:24:17 PM    Final   TEE  ECHO TEE 06/23/2022  Narrative TRANSESOPHOGEAL ECHO REPORT    Patient Name:   Corey [name] Date of Exam: 06/23/2022 Medical Rec #:  [id number]    Height:       66.0 in Accession #:    [id number]   Weight:       170.0 lb Date of Birth:  [birth year]    BSA:  66.0 in Accession #:    [id number]   Weight:       178.0 lb Date of Birth:  01/06/1946    BSA:          1.903 m Patient Age:    77 years     BP:           136/122 mmHg Patient Gender: M            HR:           48 bpm. Exam Location:  Inpatient  Procedure: 2D Echo, Cardiac Doppler, Color Doppler and Intracardiac Opacification Agent  Indications:    CHF-Acute Systolic I50.21  History:        Patient has prior history of Echocardiogram examinations, most recent 09/30/2020. CAD; Arrythmias:Bradycardia with first-degree block. Chronic systolic heart failure/cardiac  amyloidosis. History of pericardial effusion s/p pericardial window.  Sonographer:    Leta Jungling RDCS Referring Phys: [phone] ERIC CHEN  IMPRESSIONS   1. Speckled appearance to LV myocardium consistent with diagnosis of amyloid Global hypokinesis worse in the posterior lateral wall . Left ventricular ejection fraction, by estimation, is 30 to 35%. The left ventricle has moderately decreased function. The left ventricle demonstrates global hypokinesis. There is severe left ventricular hypertrophy. Left ventricular diastolic parameters are indeterminate. Elevated left ventricular end-diastolic pressure. 2. Right ventricular systolic function is normal. The right ventricular size is normal. There is moderately elevated pulmonary artery systolic pressure. 3. Left atrial size was moderately dilated. 4. Right atrial size was moderately dilated. 5. The mitral valve is normal in structure. Mild mitral valve regurgitation. No evidence of mitral stenosis. 6. The aortic valve is tricuspid. There is mild calcification of the aortic valve. There is mild thickening of the aortic valve. Aortic valve regurgitation is not visualized. Aortic valve sclerosis is present, with no evidence of aortic valve stenosis. 7. Aortic dilatation noted. There is moderate dilatation of the ascending aorta, measuring 41 mm. 8. The inferior vena cava is dilated in size with <50% respiratory variability, suggesting right atrial pressure of 15 mmHg.  FINDINGS Left Ventricle: Speckled appearance to LV myocardium consistent with diagnosis of amyloid Global hypokinesis worse in the posterior lateral wall. Left ventricular ejection fraction, by estimation, is 30 to 35%. The left ventricle has moderately decreased function. The left ventricle demonstrates global hypokinesis. Definity contrast agent was given IV to delineate the left ventricular endocardial borders. The left ventricular internal cavity size was normal in size. There is  severe left ventricular hypertrophy. Left ventricular diastolic parameters are indeterminate. Elevated left ventricular end-diastolic pressure.  Right Ventricle: The right ventricular size is normal. No increase in right ventricular wall thickness. Right ventricular systolic function is normal. There is moderately elevated pulmonary artery systolic pressure. The tricuspid regurgitant velocity is 2.86 m/s, and with an assumed right atrial pressure of 15 mmHg, the estimated right ventricular systolic pressure is 47.7 mmHg.  Left Atrium: Left atrial size was moderately dilated.  Right Atrium: Right atrial size was moderately dilated.  Pericardium: There is no evidence of pericardial effusion.  Mitral Valve: The mitral valve is normal in structure. Mild mitral valve regurgitation. No evidence of mitral valve stenosis.  Tricuspid Valve: The tricuspid valve is normal in structure. Tricuspid valve regurgitation is mild . No evidence of tricuspid stenosis.  Aortic Valve: The aortic valve is tricuspid. There is mild calcification of the aortic valve. There is mild thickening of the aortic valve. Aortic valve regurgitation is not visualized. Aortic valve sclerosis is present,  Cardiology Office Note    Date:  02/27/2023  ID:  Jonerik, Sliker 11-08-1945, MRN [id number] PCP:  Pincus Sanes, MD  Cardiologist:  Donato Schultz, MD  Electrophysiologist:  Maurice Small, MD   Chief Complaint: Acute on chronic HF   History of Present Illness: .    Corey [name] is a 77 y.o. male with visit-pertinent history of cardiac amyloidosis (ATTR), history of PEA arrest, pericardial effusion s/p window, hypertension, hyperlipidemia, ESRD not on dialysis, type 2 diabetes, history of CVA, atrial fibrillation/AFL. S/p 12/2022 underwent CRT pacemaker placement. History of ATTR cardiac amyloidosis with prior pericardial effusion s/p window with chronic reduced heart failure coronary disease end-sate renal disease approaching dialysis with atrial fibrillation and flutter.   In 2022 he had left heart catheterization that demonstrated an 80% stenosis of LAD, PCI was deferred due to marked the elevated LVEDP at that time with EF of 35%.  He also had a large pericardial effusion and a window was done with biopsies that were compatible with amyloidosis.  AL evaluation was negative.  PYP was equivocal.  Genetic testing was consistent with ATTR.  In November 2023 he was admitted with volume overload, atrial fibrillation and flutter.  He was diuresed and underwent TEE cardioversion x 2.  Following cardioversion he was bradycardic, Mobitz type I AV block was noted.  Chronotropic competence was possible.  He underwent ablation in March 2024 with multiple arrhythmias, variable circuits noted with transient focal tachycardia.  A CTI ablation was performed by Dr. Nelly Laurence and at least one of the flutters was cavotricuspid isthmus related.  A subcutaneous ICD for primary prevention of SCD was placed, in August it was noticed that his bradycardia had progressed, at OV in August he was in atrial flutter with slow ventricular rates at 36 bpm.  He was directly admitted to the hospital with symptomatic bradycardia  and underwent CRT pacemaker placement on 12/29/2022.  On 02/20/2023 patient's wife notified the office of worsening shortness of breath with rest and exertion for the 2 weeks prior.  Per Dr. Anne Fu his torsemide 20 mg was increased to twice a day.  Today he presents for follow up and for preoperative cardiac evaluation.  He reports that he has not been feeling well, he has been extremely short of breath, dizzy and lightheaded. He has had multiple falls in the last week. He denies any palpitations, chest pain or syncope. He is hypoxic on exam, oxygen saturation ranging from 86% to 93% on room air at rest, when talking consistently sits at 87%.  He reports that he has been unable to lie flat for multiple weeks, he has been sitting up in the recliner using multiple pillows at night.  He has significant edema to his hands and bilateral lower extremities, his left leg is weeping.   Labwork independently reviewed: 12/29/2022: Sodium 139, potassium 3.7, creatinine 2.57, magnesium 2.2  ROS: .   Today he denies chest pain, palpitations, melena, hematuria, hemoptysis, diaphoresis, syncope.  All other systems are reviewed and otherwise negative. Studies Reviewed: Marland Kitchen    EKG:  EKG is ordered today, personally reviewed, demonstrating  EKG Interpretation Date/Time:  Tuesday February 27 2023 14:48:03 EDT Ventricular Rate:  64 PR Interval:    QRS Duration:  96 QT Interval:  406 QTC Calculation: 418 R Axis:   170  Text Interpretation: Ventricular-paced rhythm Underlying atrial flutter When compared with ECG of 28-Dec-2022 17:48, Vent. rate has increased BY   4 BPM Confirmed by Chad,  with no evidence of aortic valve stenosis.  Pulmonic Valve: The pulmonic valve was normal in structure. Pulmonic valve regurgitation is mild. No evidence of pulmonic stenosis.  Aorta: Aortic dilatation noted. There is moderate dilatation of the ascending aorta, measuring 41 mm.  Venous: The inferior vena cava is dilated in size with less than 50% respiratory variability, suggesting right atrial pressure of 15 mmHg.  IAS/Shunts: No atrial level shunt detected by color flow Doppler.   LEFT VENTRICLE PLAX 2D LVIDd:         4.70 cm      Diastology LVIDs:         3.60 cm      LV e' medial:    5.66 cm/s LV PW:         1.70 cm      LV E/e' medial:  18.9 LV IVS:        2.15 cm      LV e' lateral:   7.29 cm/s LVOT diam:     2.20  cm      LV E/e' lateral: 14.7 LV SV:         79 LV SV Index:   41 LVOT Area:     3.80 cm  LV Volumes (MOD) LV vol d, MOD A2C: 244.0 ml LV vol d, MOD A4C: 187.5 ml LV vol s, MOD A2C: 133.0 ml LV vol s, MOD A4C: 111.0 ml LV SV MOD A2C:     111.0 ml LV SV MOD A4C:     187.5 ml LV SV MOD BP:      92.3 ml  RIGHT VENTRICLE RV S prime:     5.00 cm/s TAPSE (M-mode): 1.6 cm  LEFT ATRIUM             Index        RIGHT ATRIUM           Index LA diam:        4.50 cm 2.36 cm/m   RA Area:     23.10 cm LA Vol (A2C):   87.3 ml 45.87 ml/m  RA Volume:   67.60 ml  35.52 ml/m LA Vol (A4C):   61.9 ml 32.52 ml/m LA Biplane Vol: 75.4 ml 39.62 ml/m AORTIC VALVE             PULMONIC VALVE LVOT Vmax:   103.00 cm/s PR End Diast Vel: 8.88 msec LVOT Vmean:  76.600 cm/s LVOT VTI:    0.207 m  AORTA Ao Root diam: 2.90 cm Ao Asc diam:  4.05 cm  MITRAL VALVE                TRICUSPID VALVE MV Area (PHT): 3.91 cm     TR Peak grad:   32.7 mmHg MV Decel Time: 194 msec     TR Vmax:        286.00 cm/s MV E velocity: 107.00 cm/s SHUNTS Systemic VTI:  0.21 m Systemic Diam: 2.20 cm  Charlton Haws MD Electronically signed by Charlton Haws MD Signature Date/Time: 03/23/2022/2:24:17 PM    Final   TEE  ECHO TEE 06/23/2022  Narrative TRANSESOPHOGEAL ECHO REPORT    Patient Name:   Corey [name] Date of Exam: 06/23/2022 Medical Rec #:  [id number]    Height:       66.0 in Accession #:    [id number]   Weight:       170.0 lb Date of Birth:  [birth year]    BSA:  66.0 in Accession #:    [id number]   Weight:       178.0 lb Date of Birth:  01/06/1946    BSA:          1.903 m Patient Age:    77 years     BP:           136/122 mmHg Patient Gender: M            HR:           48 bpm. Exam Location:  Inpatient  Procedure: 2D Echo, Cardiac Doppler, Color Doppler and Intracardiac Opacification Agent  Indications:    CHF-Acute Systolic I50.21  History:        Patient has prior history of Echocardiogram examinations, most recent 09/30/2020. CAD; Arrythmias:Bradycardia with first-degree block. Chronic systolic heart failure/cardiac  amyloidosis. History of pericardial effusion s/p pericardial window.  Sonographer:    Leta Jungling RDCS Referring Phys: [phone] ERIC CHEN  IMPRESSIONS   1. Speckled appearance to LV myocardium consistent with diagnosis of amyloid Global hypokinesis worse in the posterior lateral wall . Left ventricular ejection fraction, by estimation, is 30 to 35%. The left ventricle has moderately decreased function. The left ventricle demonstrates global hypokinesis. There is severe left ventricular hypertrophy. Left ventricular diastolic parameters are indeterminate. Elevated left ventricular end-diastolic pressure. 2. Right ventricular systolic function is normal. The right ventricular size is normal. There is moderately elevated pulmonary artery systolic pressure. 3. Left atrial size was moderately dilated. 4. Right atrial size was moderately dilated. 5. The mitral valve is normal in structure. Mild mitral valve regurgitation. No evidence of mitral stenosis. 6. The aortic valve is tricuspid. There is mild calcification of the aortic valve. There is mild thickening of the aortic valve. Aortic valve regurgitation is not visualized. Aortic valve sclerosis is present, with no evidence of aortic valve stenosis. 7. Aortic dilatation noted. There is moderate dilatation of the ascending aorta, measuring 41 mm. 8. The inferior vena cava is dilated in size with <50% respiratory variability, suggesting right atrial pressure of 15 mmHg.  FINDINGS Left Ventricle: Speckled appearance to LV myocardium consistent with diagnosis of amyloid Global hypokinesis worse in the posterior lateral wall. Left ventricular ejection fraction, by estimation, is 30 to 35%. The left ventricle has moderately decreased function. The left ventricle demonstrates global hypokinesis. Definity contrast agent was given IV to delineate the left ventricular endocardial borders. The left ventricular internal cavity size was normal in size. There is  severe left ventricular hypertrophy. Left ventricular diastolic parameters are indeterminate. Elevated left ventricular end-diastolic pressure.  Right Ventricle: The right ventricular size is normal. No increase in right ventricular wall thickness. Right ventricular systolic function is normal. There is moderately elevated pulmonary artery systolic pressure. The tricuspid regurgitant velocity is 2.86 m/s, and with an assumed right atrial pressure of 15 mmHg, the estimated right ventricular systolic pressure is 47.7 mmHg.  Left Atrium: Left atrial size was moderately dilated.  Right Atrium: Right atrial size was moderately dilated.  Pericardium: There is no evidence of pericardial effusion.  Mitral Valve: The mitral valve is normal in structure. Mild mitral valve regurgitation. No evidence of mitral valve stenosis.  Tricuspid Valve: The tricuspid valve is normal in structure. Tricuspid valve regurgitation is mild . No evidence of tricuspid stenosis.  Aortic Valve: The aortic valve is tricuspid. There is mild calcification of the aortic valve. There is mild thickening of the aortic valve. Aortic valve regurgitation is not visualized. Aortic valve sclerosis is present,  Cardiology Office Note    Date:  02/27/2023  ID:  Jonerik, Sliker 11-08-1945, MRN [id number] PCP:  Pincus Sanes, MD  Cardiologist:  Donato Schultz, MD  Electrophysiologist:  Maurice Small, MD   Chief Complaint: Acute on chronic HF   History of Present Illness: .    Corey [name] is a 77 y.o. male with visit-pertinent history of cardiac amyloidosis (ATTR), history of PEA arrest, pericardial effusion s/p window, hypertension, hyperlipidemia, ESRD not on dialysis, type 2 diabetes, history of CVA, atrial fibrillation/AFL. S/p 12/2022 underwent CRT pacemaker placement. History of ATTR cardiac amyloidosis with prior pericardial effusion s/p window with chronic reduced heart failure coronary disease end-sate renal disease approaching dialysis with atrial fibrillation and flutter.   In 2022 he had left heart catheterization that demonstrated an 80% stenosis of LAD, PCI was deferred due to marked the elevated LVEDP at that time with EF of 35%.  He also had a large pericardial effusion and a window was done with biopsies that were compatible with amyloidosis.  AL evaluation was negative.  PYP was equivocal.  Genetic testing was consistent with ATTR.  In November 2023 he was admitted with volume overload, atrial fibrillation and flutter.  He was diuresed and underwent TEE cardioversion x 2.  Following cardioversion he was bradycardic, Mobitz type I AV block was noted.  Chronotropic competence was possible.  He underwent ablation in March 2024 with multiple arrhythmias, variable circuits noted with transient focal tachycardia.  A CTI ablation was performed by Dr. Nelly Laurence and at least one of the flutters was cavotricuspid isthmus related.  A subcutaneous ICD for primary prevention of SCD was placed, in August it was noticed that his bradycardia had progressed, at OV in August he was in atrial flutter with slow ventricular rates at 36 bpm.  He was directly admitted to the hospital with symptomatic bradycardia  and underwent CRT pacemaker placement on 12/29/2022.  On 02/20/2023 patient's wife notified the office of worsening shortness of breath with rest and exertion for the 2 weeks prior.  Per Dr. Anne Fu his torsemide 20 mg was increased to twice a day.  Today he presents for follow up and for preoperative cardiac evaluation.  He reports that he has not been feeling well, he has been extremely short of breath, dizzy and lightheaded. He has had multiple falls in the last week. He denies any palpitations, chest pain or syncope. He is hypoxic on exam, oxygen saturation ranging from 86% to 93% on room air at rest, when talking consistently sits at 87%.  He reports that he has been unable to lie flat for multiple weeks, he has been sitting up in the recliner using multiple pillows at night.  He has significant edema to his hands and bilateral lower extremities, his left leg is weeping.   Labwork independently reviewed: 12/29/2022: Sodium 139, potassium 3.7, creatinine 2.57, magnesium 2.2  ROS: .   Today he denies chest pain, palpitations, melena, hematuria, hemoptysis, diaphoresis, syncope.  All other systems are reviewed and otherwise negative. Studies Reviewed: Marland Kitchen    EKG:  EKG is ordered today, personally reviewed, demonstrating  EKG Interpretation Date/Time:  Tuesday February 27 2023 14:48:03 EDT Ventricular Rate:  64 PR Interval:    QRS Duration:  96 QT Interval:  406 QTC Calculation: 418 R Axis:   170  Text Interpretation: Ventricular-paced rhythm Underlying atrial flutter When compared with ECG of 28-Dec-2022 17:48, Vent. rate has increased BY   4 BPM Confirmed by Chad,

## 2023-02-26 NOTE — Progress Notes (Unsigned)
Subjective:  Patient ID: Corey [name], male    DOB: [birth year],  MRN: [id number]  Corey [name] presents to clinic today for:  Chief Complaint  Patient presents with   Diabetes    "He's supposed to get his toenails cut."   Patient notes nails are thick, discolored, elongated and painful in shoegear when trying to ambulate.  He just had a fistula placed, as he is end-stage renal failure, but not yet started on dialysis.  PCP is Pincus Sanes, MD.  Last seen 02/08/23  Past Medical History:  Diagnosis Date   Cataract    CHF (congestive heart failure) (HCC)    Chronic heart failure with preserved ejection fraction (HFpEF) (HCC)    Colon polyp 2003   Dr Victorino Dike; F/U was to be 2008( not completed)   Coronary artery disease    CVA (cerebral infarction) 2014   Diabetes mellitus    Diverticulosis 2003   Dyspnea    with exertion   Gout    Hereditary cardiac amyloidosis (HCC)    Hypertension    Myocardial infarction (HCC) 09/19/2012   PEA cardiac arrest in setting of acute respiratory failure/pulmonary edema   OSA (obstructive sleep apnea) 03/27/2018   Pneumonia 09/29/2011   Avelox X 10 days as OP   Prostate cancer (HCC)    Renal insufficiency    Seizures (HCC) 09/19/2012   not treated for seizure disorder; had a seizure after stroke 2014; no seizure since then   Stroke [facility])     Past Surgical History:  Procedure Laterality Date   A-FLUTTER ABLATION N/A 06/23/2022   Procedure: A-FLUTTER ABLATION;  Surgeon: Maurice Small, MD;  [facility] INV[facility] CV LAB;  Service: Cardiovascular;  Laterality: N/A;   A/V FISTULAGRAM Left 04/28/2022   Procedure: A/V Fistulagram;  Surgeon: Chuck Hint, MD;  Location: [facility] INVASIVE CV LAB;  Service: Cardiovascular;  Laterality: Left;   AV FISTULA PLACEMENT Left 01/31/2022   Procedure: LEFT ARM ARTERIOVENOUS (AV) FISTULA CREATION;  Surgeon: Maeola Harman, MD;  Location: [facility] OR;  Service: Vascular;  Laterality: Left;   regional block to left arm   CARDIOVERSION N/A 03/24/2022   Procedure: CARDIOVERSION;  Surgeon: Dorthula Nettles, DO;  [facility] ENDOSCOPY;  Service: Cardiovascular;  Laterality: N/A;   COLONOSCOPY W/ POLYPECTOMY  2003   no F/U (SOC discussed 12/03/12)   INGUINAL HERNIA REPAIR Left 06/16/2021   Procedure: LAPAROSCOPIC, POSSIBLY OPEN LEFT INGUINAL HERNIA REPAIR WITH MESH;  Surgeon: Quentin Ore, MD;  Location: [facility] ORS;  Service: General;  Laterality: Left;   INSERTION OF MESH N/A 07/16/2017   Procedure: INSERTION OF MESH;  Surgeon: Berna Bue, MD;  [facility]: [facility] OR;  Service: General;  Laterality: N/A;   PACEMAKER IMPLANT N/A 12/28/2022   Procedure: PACEMAKER IMPLANT;  Surgeon: Maurice Small, MD;  [facility] INVAS[facility] CV LAB;  Service: Cardiovascular;  Laterality: N/A;   PEG PLACEMENT  2014   10/08/12-12/10/12   PEG TUBE REMOVAL  2014   PERIPHERAL VASCULAR BALLOON ANGIOPLASTY  04/28/2022   Procedure: PERIPHERAL VASCULAR BALLOON ANGIOPLASTY;  Surgeon: Chuck Hint, MD;  Location: [facility] INVASIVE CV LAB;  Service: Cardiovascular;;   PROSTATE BIOPSY     RIGHT HEART CATH N/A 03/24/2022   Procedure: RIGHT HEART CATH;  Surgeon: Dorthula Nettles, DO;  [facility];  Service: Cardiovascular;  Laterality: N/A;   RIGHT/LEFT HEART CATH AND CORONARY ANGIOGRAPHY N/A 09/20/2020   Procedure: RIGHT/LEFT HEART CATH AND CORONARY ANGIOGRAPHY;  Surgeon: Marykay Lex, MD;  Location: [facility] INVASIVE CV LAB;  Service: Cardiovascular;  Laterality: N/A;   SUBQ ICD IMPLANT N/A 09/04/2022   Procedure: SUBQ ICD IMPLANT;  Surgeon: Maurice Small, MD;  [facility];  Service: Cardiovascular;  Laterality: N/A;   TEE WITHOUT CARDIOVERSION N/A 09/27/2020   Procedure: TRANSESOPHAGEAL ECHOCARDIOGRAM (TEE);  Surgeon: Linden Dolin, MD;  Location: [facility] OR;  Service: Thoracic;  Laterality: N/A;   TEE WITHOUT CARDIOVERSION N/A 03/24/2022   Procedure: TRANSESOPHAGEAL  ECHOCARDIOGRAM (TEE);  Surgeon: Dorthula Nettles, DO;  [facility];  Service: Cardiovascular;  Laterality: N/A;   TEE WITHOUT CARDIOVERSION N/A 06/23/2022   Procedure: TRANSESOPHAGEAL ECHOCARDIOGRAM (TEE);  Surgeon: Maurice Small, MD;  Location: [facility] INVASIVE CV LAB;  Service: Cardiovascular;  Laterality: N/A;   TRACHEOSTOMY  2014   09/30/12-10/20/12   VENTRAL HERNIA REPAIR N/A 07/16/2017   Procedure: LAPAROSCOPIC VENTRAL HERNIA REPAIR WITH MESH;  Surgeon: Berna Bue, MD;  [facility];  Service: General;  Laterality: N/A;   wrist aspiration  02/16/2012    monosodium urate crystals; Dr Melvyn Novas    Allergies  Allergen Reactions   Hydrochlorothiazide Other (See Comments)    Gout , uncontrolled diabetes and renal insufficiency    Review of Systems: Negative except as noted in the HPI.  Objective:  Corey [name] is a pleasant 77 y.o. male in NAD. AAO x 3.  Vascular Examination: Capillary refill time is 3-5 seconds to toes bilateral. Trace palpable pedal pulses b/l LE. Digital hair sparse b/l.  Skin temperature gradient WNL b/l.   Dermatological Examination: Pedal skin with decreased turgor, texture and tone b/l. No open wounds. No interdigital macerations b/l. Toenails x10 are 3mm thick, discolored, dystrophic with subungual debris. There is pain with compression of the nail plates.  They are elongated x10  Neurological Examination: Protective sensation intact bilateral LE. Vibratory sensation diminished bilateral LE.  Musculoskeletal Examination: Muscle strength 5/5 to all LE muscle groups b/l.  Contractures of lesser toes at PIPJs.     Latest Ref Rng & Units 02/13/2023    8:45 AM 09/19/2022   10:03 AM 05/11/2022   10:01 AM 03/23/2022    6:30 AM  Hemoglobin A1C  Hemoglobin-A1c 4.6 - 6.5 % 6.1  7.0  6.0  5.7    Assessment/Plan: 1. Encounter for diabetic foot exam (HCC)   2. Type II diabetes mellitus with peripheral circulatory disorder (HCC)   3. Hammertoe of  right foot   4. Hammertoe of left foot   5. Pain due to onychomycosis of toenails of both feet     FOR HOME USE ONLY DME DIABETIC SHOE - Patient will benefit from the program due to diabetes with PVD and hammertoes.  Will schedule with our pedorthist for diabetic shoe consultation. His next appt with endocrinology is mid-November.   The mycotic toenails were sharply debrided x10 with sterile nail nippers and a power debriding burr to decrease bulk/thickness and length.    Return in about 3 months (around 05/29/2023) for [facility].   Clerance Lav, DPM, FACFAS [facility]     [address], [place] [address]                Office [phone]  [phone]  Fax [phone]

## 2023-02-27 ENCOUNTER — Other Ambulatory Visit: Payer: Self-pay

## 2023-02-27 ENCOUNTER — Encounter: Payer: Self-pay | Admitting: Cardiology

## 2023-02-27 ENCOUNTER — Encounter: Payer: Self-pay | Admitting: Nurse Practitioner

## 2023-02-27 ENCOUNTER — Ambulatory Visit: Payer: Medicare HMO | Attending: Nurse Practitioner | Admitting: Cardiology

## 2023-02-27 ENCOUNTER — Emergency Department (HOSPITAL_COMMUNITY): Payer: Medicare HMO

## 2023-02-27 ENCOUNTER — Inpatient Hospital Stay (HOSPITAL_COMMUNITY)
Admission: EM | Admit: 2023-02-27 | Discharge: 2023-03-13 | DRG: 291 | Disposition: A | Discharge status: Skilled Nursing Facility | Payer: Medicare HMO | Attending: Internal Medicine | Admitting: Internal Medicine

## 2023-02-27 ENCOUNTER — Encounter (HOSPITAL_COMMUNITY): Payer: Self-pay

## 2023-02-27 VITALS — BP 110/82 | HR 64 | Ht 66.0 in | Wt 191.2 lb

## 2023-02-27 DIAGNOSIS — R531 Weakness: Secondary | ICD-10-CM | POA: Diagnosis not present

## 2023-02-27 DIAGNOSIS — C61 Malignant neoplasm of prostate: Secondary | ICD-10-CM | POA: Diagnosis present

## 2023-02-27 DIAGNOSIS — D631 Anemia in chronic kidney disease: Secondary | ICD-10-CM | POA: Diagnosis present

## 2023-02-27 DIAGNOSIS — R001 Bradycardia, unspecified: Secondary | ICD-10-CM | POA: Diagnosis not present

## 2023-02-27 DIAGNOSIS — T82868A Thrombosis of vascular prosthetic devices, implants and grafts, initial encounter: Secondary | ICD-10-CM | POA: Diagnosis present

## 2023-02-27 DIAGNOSIS — R809 Proteinuria, unspecified: Secondary | ICD-10-CM | POA: Diagnosis not present

## 2023-02-27 DIAGNOSIS — Z8701 Personal history of pneumonia (recurrent): Secondary | ICD-10-CM

## 2023-02-27 DIAGNOSIS — E162 Hypoglycemia, unspecified: Secondary | ICD-10-CM | POA: Diagnosis not present

## 2023-02-27 DIAGNOSIS — N179 Acute kidney failure, unspecified: Secondary | ICD-10-CM | POA: Diagnosis present

## 2023-02-27 DIAGNOSIS — Z1152 Encounter for screening for COVID-19: Secondary | ICD-10-CM | POA: Diagnosis not present

## 2023-02-27 DIAGNOSIS — Z01810 Encounter for preprocedural cardiovascular examination: Secondary | ICD-10-CM

## 2023-02-27 DIAGNOSIS — K5900 Constipation, unspecified: Secondary | ICD-10-CM | POA: Diagnosis not present

## 2023-02-27 DIAGNOSIS — N2581 Secondary hyperparathyroidism of renal origin: Secondary | ICD-10-CM | POA: Diagnosis present

## 2023-02-27 DIAGNOSIS — G4733 Obstructive sleep apnea (adult) (pediatric): Secondary | ICD-10-CM | POA: Diagnosis present

## 2023-02-27 DIAGNOSIS — Y712 Prosthetic and other implants, materials and accessory cardiovascular devices associated with adverse incidents: Secondary | ICD-10-CM | POA: Diagnosis present

## 2023-02-27 DIAGNOSIS — I255 Ischemic cardiomyopathy: Secondary | ICD-10-CM

## 2023-02-27 DIAGNOSIS — E785 Hyperlipidemia, unspecified: Secondary | ICD-10-CM | POA: Diagnosis present

## 2023-02-27 DIAGNOSIS — I482 Chronic atrial fibrillation, unspecified: Secondary | ICD-10-CM | POA: Diagnosis not present

## 2023-02-27 DIAGNOSIS — Z8042 Family history of malignant neoplasm of prostate: Secondary | ICD-10-CM

## 2023-02-27 DIAGNOSIS — D649 Anemia, unspecified: Secondary | ICD-10-CM | POA: Diagnosis not present

## 2023-02-27 DIAGNOSIS — Z8673 Personal history of transient ischemic attack (TIA), and cerebral infarction without residual deficits: Secondary | ICD-10-CM

## 2023-02-27 DIAGNOSIS — J441 Chronic obstructive pulmonary disease with (acute) exacerbation: Secondary | ICD-10-CM | POA: Diagnosis not present

## 2023-02-27 DIAGNOSIS — M778 Other enthesopathies, not elsewhere classified: Secondary | ICD-10-CM | POA: Diagnosis not present

## 2023-02-27 DIAGNOSIS — I509 Heart failure, unspecified: Secondary | ICD-10-CM | POA: Diagnosis not present

## 2023-02-27 DIAGNOSIS — Z8674 Personal history of sudden cardiac arrest: Secondary | ICD-10-CM

## 2023-02-27 DIAGNOSIS — M109 Gout, unspecified: Secondary | ICD-10-CM | POA: Diagnosis present

## 2023-02-27 DIAGNOSIS — N185 Chronic kidney disease, stage 5: Secondary | ICD-10-CM | POA: Diagnosis not present

## 2023-02-27 DIAGNOSIS — Z7984 Long term (current) use of oral hypoglycemic drugs: Secondary | ICD-10-CM

## 2023-02-27 DIAGNOSIS — I130 Hypertensive heart and chronic kidney disease with heart failure and stage 1 through stage 4 chronic kidney disease, or unspecified chronic kidney disease: Principal | ICD-10-CM | POA: Diagnosis present

## 2023-02-27 DIAGNOSIS — I252 Old myocardial infarction: Secondary | ICD-10-CM

## 2023-02-27 DIAGNOSIS — R0902 Hypoxemia: Secondary | ICD-10-CM | POA: Diagnosis not present

## 2023-02-27 DIAGNOSIS — M19011 Primary osteoarthritis, right shoulder: Secondary | ICD-10-CM | POA: Diagnosis not present

## 2023-02-27 DIAGNOSIS — I43 Cardiomyopathy in diseases classified elsewhere: Secondary | ICD-10-CM

## 2023-02-27 DIAGNOSIS — R0989 Other specified symptoms and signs involving the circulatory and respiratory systems: Secondary | ICD-10-CM | POA: Diagnosis not present

## 2023-02-27 DIAGNOSIS — Z95810 Presence of automatic (implantable) cardiac defibrillator: Secondary | ICD-10-CM

## 2023-02-27 DIAGNOSIS — M25511 Pain in right shoulder: Secondary | ICD-10-CM

## 2023-02-27 DIAGNOSIS — G250 Essential tremor: Secondary | ICD-10-CM | POA: Diagnosis present

## 2023-02-27 DIAGNOSIS — I493 Ventricular premature depolarization: Secondary | ICD-10-CM | POA: Diagnosis not present

## 2023-02-27 DIAGNOSIS — E559 Vitamin D deficiency, unspecified: Secondary | ICD-10-CM | POA: Diagnosis not present

## 2023-02-27 DIAGNOSIS — E1140 Type 2 diabetes mellitus with diabetic neuropathy, unspecified: Secondary | ICD-10-CM | POA: Diagnosis present

## 2023-02-27 DIAGNOSIS — I5023 Acute on chronic systolic (congestive) heart failure: Secondary | ICD-10-CM | POA: Insufficient documentation

## 2023-02-27 DIAGNOSIS — R339 Retention of urine, unspecified: Secondary | ICD-10-CM | POA: Diagnosis not present

## 2023-02-27 DIAGNOSIS — R609 Edema, unspecified: Secondary | ICD-10-CM | POA: Diagnosis not present

## 2023-02-27 DIAGNOSIS — E854 Organ-limited amyloidosis: Secondary | ICD-10-CM | POA: Diagnosis present

## 2023-02-27 DIAGNOSIS — I2510 Atherosclerotic heart disease of native coronary artery without angina pectoris: Secondary | ICD-10-CM | POA: Diagnosis present

## 2023-02-27 DIAGNOSIS — I480 Paroxysmal atrial fibrillation: Secondary | ICD-10-CM | POA: Diagnosis present

## 2023-02-27 DIAGNOSIS — Z833 Family history of diabetes mellitus: Secondary | ICD-10-CM

## 2023-02-27 DIAGNOSIS — Z8249 Family history of ischemic heart disease and other diseases of the circulatory system: Secondary | ICD-10-CM

## 2023-02-27 DIAGNOSIS — Z79899 Other long term (current) drug therapy: Secondary | ICD-10-CM

## 2023-02-27 DIAGNOSIS — Z888 Allergy status to other drugs, medicaments and biological substances status: Secondary | ICD-10-CM

## 2023-02-27 DIAGNOSIS — E11649 Type 2 diabetes mellitus with hypoglycemia without coma: Secondary | ICD-10-CM | POA: Diagnosis not present

## 2023-02-27 DIAGNOSIS — I517 Cardiomegaly: Secondary | ICD-10-CM | POA: Diagnosis not present

## 2023-02-27 DIAGNOSIS — M6281 Muscle weakness (generalized): Secondary | ICD-10-CM | POA: Diagnosis not present

## 2023-02-27 DIAGNOSIS — M10361 Gout due to renal impairment, right knee: Secondary | ICD-10-CM | POA: Diagnosis not present

## 2023-02-27 DIAGNOSIS — I4892 Unspecified atrial flutter: Secondary | ICD-10-CM | POA: Diagnosis present

## 2023-02-27 DIAGNOSIS — Z860100 Personal history of colon polyps, unspecified: Secondary | ICD-10-CM

## 2023-02-27 DIAGNOSIS — I10 Essential (primary) hypertension: Secondary | ICD-10-CM | POA: Diagnosis not present

## 2023-02-27 DIAGNOSIS — I5020 Unspecified systolic (congestive) heart failure: Secondary | ICD-10-CM | POA: Diagnosis not present

## 2023-02-27 DIAGNOSIS — E873 Alkalosis: Secondary | ICD-10-CM | POA: Diagnosis not present

## 2023-02-27 DIAGNOSIS — G8929 Other chronic pain: Secondary | ICD-10-CM | POA: Diagnosis not present

## 2023-02-27 DIAGNOSIS — J9601 Acute respiratory failure with hypoxia: Secondary | ICD-10-CM | POA: Diagnosis present

## 2023-02-27 DIAGNOSIS — E119 Type 2 diabetes mellitus without complications: Secondary | ICD-10-CM | POA: Diagnosis not present

## 2023-02-27 DIAGNOSIS — Z87891 Personal history of nicotine dependence: Secondary | ICD-10-CM

## 2023-02-27 DIAGNOSIS — R918 Other nonspecific abnormal finding of lung field: Secondary | ICD-10-CM | POA: Diagnosis not present

## 2023-02-27 DIAGNOSIS — I959 Hypotension, unspecified: Secondary | ICD-10-CM | POA: Diagnosis not present

## 2023-02-27 DIAGNOSIS — E1122 Type 2 diabetes mellitus with diabetic chronic kidney disease: Secondary | ICD-10-CM | POA: Diagnosis present

## 2023-02-27 DIAGNOSIS — D61818 Other pancytopenia: Secondary | ICD-10-CM | POA: Diagnosis present

## 2023-02-27 DIAGNOSIS — M7989 Other specified soft tissue disorders: Secondary | ICD-10-CM | POA: Diagnosis not present

## 2023-02-27 DIAGNOSIS — I161 Hypertensive emergency: Secondary | ICD-10-CM | POA: Diagnosis present

## 2023-02-27 DIAGNOSIS — R350 Frequency of micturition: Secondary | ICD-10-CM | POA: Diagnosis not present

## 2023-02-27 DIAGNOSIS — R0600 Dyspnea, unspecified: Secondary | ICD-10-CM | POA: Diagnosis not present

## 2023-02-27 DIAGNOSIS — Z7901 Long term (current) use of anticoagulants: Secondary | ICD-10-CM

## 2023-02-27 DIAGNOSIS — E161 Other hypoglycemia: Secondary | ICD-10-CM | POA: Diagnosis not present

## 2023-02-27 DIAGNOSIS — N184 Chronic kidney disease, stage 4 (severe): Secondary | ICD-10-CM

## 2023-02-27 DIAGNOSIS — Z7401 Bed confinement status: Secondary | ICD-10-CM | POA: Diagnosis not present

## 2023-02-27 DIAGNOSIS — R0602 Shortness of breath: Secondary | ICD-10-CM | POA: Diagnosis present

## 2023-02-27 LAB — COMPREHENSIVE METABOLIC PANEL
ALT: 19 U/L (ref 0–44)
AST: 46 U/L — ABNORMAL HIGH (ref 15–41)
Albumin: 3 g/dL — ABNORMAL LOW (ref 3.5–5.0)
Alkaline Phosphatase: 98 U/L (ref 38–126)
Anion gap: 10 (ref 5–15)
BUN: 73 mg/dL — ABNORMAL HIGH (ref 8–23)
CO2: 26 mmol/L (ref 22–32)
Calcium: 9.5 mg/dL (ref 8.9–10.3)
Chloride: 105 mmol/L (ref 98–111)
Creatinine, Ser: 3.14 mg/dL — ABNORMAL HIGH (ref 0.61–1.24)
GFR, Estimated: 20 mL/min — ABNORMAL LOW (ref >60)
Glucose, Bld: 83 mg/dL (ref 70–99)
Potassium: 4.3 mmol/L (ref 3.5–5.1)
Sodium: 141 mmol/L (ref 135–145)
Total Bilirubin: 1.7 mg/dL — ABNORMAL HIGH (ref 0.3–1.2)
Total Protein: 7.4 g/dL (ref 6.5–8.1)

## 2023-02-27 LAB — CBC
HCT: 38.1 % — ABNORMAL LOW (ref 39.0–52.0)
Hemoglobin: 11.6 g/dL — ABNORMAL LOW (ref 13.0–17.0)
MCH: 25.5 pg — ABNORMAL LOW (ref 26.0–34.0)
MCHC: 30.4 g/dL (ref 30.0–36.0)
MCV: 83.7 fL (ref 80.0–100.0)
Platelets: 140 10*3/uL — ABNORMAL LOW (ref 150–400)
RBC: 4.55 MIL/uL (ref 4.22–5.81)
RDW: 21.7 % — ABNORMAL HIGH (ref 11.5–15.5)
WBC: 2.3 10*3/uL — ABNORMAL LOW (ref 4.0–10.5)
nRBC: 0 % (ref 0.0–0.2)

## 2023-02-27 LAB — RESP PANEL BY RT-PCR (RSV, FLU A&B, COVID)  RVPGX2
Influenza A by PCR: NEGATIVE
Influenza B by PCR: NEGATIVE
Resp Syncytial Virus by PCR: NEGATIVE
SARS Coronavirus 2 by RT PCR: NEGATIVE

## 2023-02-27 LAB — I-STAT CHEM 8, ED
BUN: 71 mg/dL — ABNORMAL HIGH (ref 8–23)
Calcium, Ion: 0.85 mmol/L — CL (ref 1.15–1.40)
Chloride: 116 mmol/L — ABNORMAL HIGH (ref 98–111)
Creatinine, Ser: 2.3 mg/dL — ABNORMAL HIGH (ref 0.61–1.24)
Glucose, Bld: 55 mg/dL — ABNORMAL LOW (ref 70–99)
HCT: 31 % — ABNORMAL LOW (ref 39.0–52.0)
Hemoglobin: 10.5 g/dL — ABNORMAL LOW (ref 13.0–17.0)
Potassium: 3.5 mmol/L (ref 3.5–5.1)
Sodium: 146 mmol/L — ABNORMAL HIGH (ref 135–145)
TCO2: 19 mmol/L — ABNORMAL LOW (ref 22–32)

## 2023-02-27 LAB — BRAIN NATRIURETIC PEPTIDE: B Natriuretic Peptide: 1686.8 pg/mL — ABNORMAL HIGH (ref 0.0–100.0)

## 2023-02-27 LAB — D-DIMER, QUANTITATIVE: D-Dimer, Quant: 13.86 ug{FEU}/mL — ABNORMAL HIGH (ref 0.00–0.50)

## 2023-02-27 MED ORDER — FUROSEMIDE 10 MG/ML IJ SOLN
40.0000 mg | Freq: Once | INTRAMUSCULAR | Status: AC
  Administered 2023-02-27: 40 mg via INTRAVENOUS
  Filled 2023-02-27: qty 4

## 2023-02-27 NOTE — ED Triage Notes (Signed)
PT was bib [facility] from cardiology office.PT was there to get a fistula put in but they noticed that his abdomen was extended, legs were swelling and weeping.PT is on torsemide and compliant but its not working.PT reports no cp but sob on exertion and hasn't been able to lay flat in a long time.Poor perfusion in fingers. PT has been constipated , took a laxative and had a bm yesterday

## 2023-02-27 NOTE — Patient Instructions (Signed)
Medication Instructions:  Your physician recommends that you continue on your current medications as directed. Please refer to the Current Medication list given to you today.  *If you need a refill on your cardiac medications before your next appointment, please call your pharmacy*   Lab Work: NONE ordered at this time of appointment     Testing/Procedures: NONE ordered at this time of appointment     Follow-Up: At [facility], you and your health needs are our priority.  As part of our continuing mission to provide you with exceptional heart care, we have created designated Provider Care Teams.  These Care Teams include your primary Cardiologist (physician) and Advanced Practice Providers (APPs -  Physician Assistants and Nurse Practitioners) who all work together to provide you with the care you need, when you need it.  We recommend signing up for the patient portal called "MyChart".  Sign up information is provided on this After Visit Summary.  MyChart is used to connect with patients for Virtual Visits (Telemedicine).  Patients are able to view lab/test results, encounter notes, upcoming appointments, etc.  Non-urgent messages can be sent to your provider as well.   To learn more about what you can do with MyChart, go to [email or web address].    Your next appointment:    Keep follow up   Provider:   Donato Schultz, MD     Other Instructions Proceed to ED

## 2023-02-27 NOTE — ED Notes (Signed)
Lab called with a 2nd CMP hemolyzed.

## 2023-02-27 NOTE — ED Provider Notes (Signed)
[place] EMERGENCY DEPARTMENT AT [facility] Provider Note   CSN: [id number] Arrival date & time: 02/27/23  1628     History  Chief Complaint  Patient presents with   Shortness of Breath    Swelling    Leg Swelling    Corey [name] is a 77 y.o. male with past history of CHF, HFpEF, diabetes, gout, hypertension, prostate cancer, seizure, stroke present today for evaluation of shortness of breath, leg swelling.  Patient reports increased shortness of breath in last 7 days.  He also reports some swelling on both of his legs, right more than left.  He reports cold/chills at home.  Denies any fever.  He is taking torsemide for CHF and has been compliant with his medications.   Shortness of Breath   Past Medical History:  Diagnosis Date   Cataract    CHF (congestive heart failure) (HCC)    Chronic heart failure with preserved ejection fraction (HFpEF) (HCC)    Colon polyp 2003   Dr Victorino Dike; F/U was to be 2008( not completed)   Coronary artery disease    CVA (cerebral infarction) 2014   Diabetes mellitus    Diverticulosis 2003   Dyspnea    with exertion   Gout    Hereditary cardiac amyloidosis (HCC)    Hypertension    Myocardial infarction (HCC) 09/19/2012   PEA cardiac arrest in setting of acute respiratory failure/pulmonary edema   OSA (obstructive sleep apnea) 03/27/2018   Pneumonia 09/29/2011   Avelox X 10 days as OP   Prostate cancer (HCC)    Renal insufficiency    Seizures (HCC) 09/19/2012   not treated for seizure disorder; had a seizure after stroke 2014; no seizure since then   Stroke [facility])    Past Surgical History:  Procedure Laterality Date   A-FLUTTER ABLATION N/A 06/23/2022   Procedure: A-FLUTTER ABLATION;  Surgeon: Maurice Small, MD;  [facility] INVAS[facility] CV LAB;  Service: Cardiovascular;  Laterality: N/A;   A/V FISTULAGRAM Left 04/28/2022   Procedure: A/V Fistulagram;  Surgeon: Chuck Hint, MD;  Location: [facility] INVASIVE CV  LAB;  Service: Cardiovascular;  Laterality: Left;   AV FISTULA PLACEMENT Left 01/31/2022   Procedure: LEFT ARM ARTERIOVENOUS (AV) FISTULA CREATION;  Surgeon: Maeola Harman, MD;  Location: [facility] OR;  Service: Vascular;  Laterality: Left;  regional block to left arm   CARDIOVERSION N/A 03/24/2022   Procedure: CARDIOVERSION;  Surgeon: Dorthula Nettles, DO;  [facility] ENDOS[facility];  Service: Cardiovascular;  Laterality: N/A;   COLONOSCOPY W/ POLYPECTOMY  2003   no F/U (SOC discussed 12/03/12)   INGUINAL HERNIA REPAIR Left 06/16/2021   Procedure: LAPAROSCOPIC, POSSIBLY OPEN LEFT INGUINAL HERNIA REPAIR WITH MESH;  Surgeon: Quentin Ore, MD;  Location: [facility] ORS;  Service: General;  Laterality: Left;   INSERTION OF MESH N/A 07/16/2017   Procedure: INSERTION OF MESH;  Surgeon: Berna Bue, MD;  [facility] OR;  Service: General;  Laterality: N/A;   PACEMAKER IMPLANT N/A 12/28/2022   Procedure: PACEMAKER IMPLANT;  Surgeon: Maurice Small, MD;  [facility] INVASIVE CV LAB;  Service: Cardiovascular;  Laterality: N/A;   PEG PLACEMENT  2014   10/08/12-12/10/12   PEG TUBE REMOVAL  2014   PERIPHERAL VASCULAR BALLOON ANGIOPLASTY  04/28/2022   Procedure: PERIPHERAL VASCULAR BALLOON ANGIOPLASTY;  Surgeon: Chuck Hint, MD;  Location: [facility] INVASIVE CV LAB;  Service: Cardiovascular;;   PROSTATE BIOPSY     RIGHT HEART CATH N/A 03/24/2022  Procedure: RIGHT HEART CATH;  Surgeon: Dorthula Nettles, DO;  [facility] INV[facility];  Service: Cardiovascular;  Laterality: N/A;   RIGHT/LEFT HEART CATH AND CORONARY ANGIOGRAPHY N/A 09/20/2020   Procedure: RIGHT/LEFT HEART CATH AND CORONARY ANGIOGRAPHY;  Surgeon: Marykay Lex, MD;  Location: [facility] INVASIVE CV LAB;  Service: Cardiovascular;  Laterality: N/A;   SUBQ ICD IMPLANT N/A 09/04/2022   Procedure: SUBQ ICD IMPLANT;  Surgeon: Maurice Small, MD;  [facility] INV[facility];  Service: Cardiovascular;  Laterality: N/A;   TEE  WITHOUT CARDIOVERSION N/A 09/27/2020   Procedure: TRANSESOPHAGEAL ECHOCARDIOGRAM (TEE);  Surgeon: Linden Dolin, MD;  Location: [facility] OR;  Service: Thoracic;  Laterality: N/A;   TEE WITHOUT CARDIOVERSION N/A 03/24/2022   Procedure: TRANSESOPHAGEAL ECHOCARDIOGRAM (TEE);  Surgeon: Dorthula Nettles, DO;  [facility] ENDOS[facility];  Service: Cardiovascular;  Laterality: N/A;   TEE WITHOUT CARDIOVERSION N/A 06/23/2022   Procedure: TRANSESOPHAGEAL ECHOCARDIOGRAM (TEE);  Surgeon: Maurice Small, MD;  Location: [facility] INVASIVE CV LAB;  Service: Cardiovascular;  Laterality: N/A;   TRACHEOSTOMY  2014   09/30/12-10/20/12   VENTRAL HERNIA REPAIR N/A 07/16/2017   Procedure: LAPAROSCOPIC VENTRAL HERNIA REPAIR WITH MESH;  Surgeon: Berna Bue, MD;  [facility]: [facility] OR;  Service: General;  Laterality: N/A;   wrist aspiration  02/16/2012    monosodium urate crystals; Dr Melvyn Novas     Home Medications Prior to Admission medications   Medication Sig Start Date End Date Taking? Authorizing Provider  acetaminophen (TYLENOL) 500 MG tablet Take 1,000 mg by mouth every 8 (eight) hours as needed for moderate pain.    [provider]  albuterol (VENTOLIN HFA) 108 (90 Base) MCG/ACT inhaler Inhale 2 puffs into the lungs every 4 (four) hours as needed for wheezing or shortness of breath. 11/14/21   Valinda Hoar, NP  allopurinol (ZYLOPRIM) 100 MG tablet Take 1/2 (one-half) tablet by mouth once daily 01/18/23   Pincus Sanes, MD  apixaban (ELIQUIS) 5 MG TABS tablet Take 1 tablet by mouth twice daily 01/29/23   Mealor, Roberts Gaudy, MD  bismuth subsalicylate (PEPTO BISMOL) 262 MG/15ML suspension Take 30 mLs by mouth every 6 (six) hours as needed for indigestion or diarrhea or loose stools.    [provider]  cholecalciferol (VITAMIN D3) 25 MCG (1000 UNIT) tablet Take 1,000 Units by mouth daily.    [provider]  colchicine (COLCRYS) 0.6 MG tablet Take 2 tabs once and then one tab one hour later as  needed for gout Patient taking differently: Take 0.6 mg by mouth as needed (gout). 07/30/20   Pincus Sanes, MD  glucose blood (ACCU-CHEK AVIVA PLUS) test strip Use to check blood glucose 4 times a day. 12/25/22   Reather Littler, MD  hydrALAZINE (APRESOLINE) 25 MG tablet TAKE 1 TABLET THREE TIMES DAILY 12/07/22   Pincus Sanes, MD  rosuvastatin (CRESTOR) 5 MG tablet Take 1 tablet (5 mg total) by mouth daily. 06/26/22   Mealor, Roberts Gaudy, MD  sennosides-docusate sodium (SENOKOT-S) 8.6-50 MG tablet Take 2 tablets by mouth daily as needed for constipation.    [provider]  sitaGLIPtin (JANUVIA) 50 MG tablet Take 0.5 tablets (25 mg total) by mouth daily. 01/02/23   Reather Littler, MD  sodium bicarbonate 650 MG tablet TAKE 1 TABLET BY MOUTH TWICE DAILY . APPOINTMENT REQUIRED FOR FUTURE REFILLS 01/18/23   Pincus Sanes, MD  Tafamidis [facility]) 61 MG CAPS Take 1 capsule (61 mg total) by mouth daily. 06/19/22  Jake Bathe, MD  torsemide (DEMADEX) 20 MG tablet Take 20 mg by mouth 2 (two) times daily.    [provider]  TRUEplus Lancets 33G MISC USE 4 (FOUR) TIMES DAILY 04/04/21   Reather Littler, MD      Allergies    Hydrochlorothiazide    Review of Systems   Review of Systems  Respiratory:  Positive for shortness of breath.     Physical Exam Updated Vital Signs BP (!) 123/94 (BP Location: Right Arm)   Pulse 66   Temp 98.1 F (36.7 C) (Oral)   Resp (!) 26   Ht 5\' 6"  (1.676 m)   Wt 86.7 kg   SpO2 100%   BMI 30.85 kg/m  Physical Exam Vitals and nursing note reviewed.  Constitutional:      Appearance: Normal appearance.  HENT:     Head: Normocephalic and atraumatic.     Mouth/Throat:     Mouth: Mucous membranes are moist.  Eyes:     General: No scleral icterus. Cardiovascular:     Rate and Rhythm: Normal rate and regular rhythm.     Pulses: Normal pulses.     Heart sounds: Normal heart sounds.  Pulmonary:     Effort: Pulmonary effort is normal.     Breath sounds:  Normal breath sounds.     Comments: Placed on 4 L [place].  O2 sat 93%. Abdominal:     General: Abdomen is flat.     Palpations: Abdomen is soft.     Tenderness: There is no abdominal tenderness.  Musculoskeletal:        General: No deformity.     Comments: Bilateral lower extremity nonpitting edema.  Skin:    General: Skin is warm.     Findings: No rash.  Neurological:     General: No focal deficit present.     Mental Status: He is alert.  Psychiatric:        Mood and Affect: Mood normal.     ED Results / Procedures / Treatments   Labs (all labs ordered are listed, but only abnormal results are displayed) Labs Reviewed  RESP PANEL BY RT-PCR (RSV, FLU A&B, COVID)  RVPGX2  RESP PANEL BY RT-PCR (RSV, FLU A&B, COVID)  RVPGX2  COMPREHENSIVE METABOLIC PANEL  CBC  BRAIN NATRIURETIC PEPTIDE    EKG None  Radiology No results found.  Procedures Procedures    Medications Ordered in ED Medications - No data to display  ED Course/ Medical Decision Making/ A&P                                 Medical Decision Making Amount and/or Complexity of Data Reviewed Labs: ordered. Radiology: ordered.  Risk Prescription drug management. Decision regarding hospitalization.   This patient presents to the ED for shortness of breath, leg swelling, this involves an extensive number of treatment options, and is a complaint that carries with a high risk of complications and morbidity.  The differential diagnosis includes CHF/ACS, COPD asthma, pneumonia, anaphylaxis, PE, pneumothorax, anxiety, COVID-19.  This is not an exhaustive list.  Lab tests: I ordered and personally interpreted labs.  The pertinent results include: WBC unremarkable. Hbg unremarkable. Platelets unremarkable. Electrolytes unremarkable. BUN, creatinine unremarkable.  D-dimer 13.8.  BNP 1686.8.  Viral panel is negative.  Imaging studies: I ordered imaging studies, personally reviewed, interpreted imaging and agree with  the radiologist's interpretations. The results include: Chest x-ray showed  left larger than right pleural effusion.  Problem list/ ED course/ Critical interventions/ Medical management: HPI: See above Vital signs within normal range and stable throughout visit. Laboratory/imaging studies significant for: See above. On physical examination, patient is afebrile and appears in no acute distress. This patient with history of heart failure, presenting with likely acute decompensated heart failure causing volume overload and pleural effusion. The etiology of the decompensation is not certain. Alternative etiologies I considered include cardiac (ACS, valvular disease, arrhythmia, myocarditis/endocarditis, dissection) however given unremarkable trop, ekg, cardiac exam have low suspicion. Also considered but low risk for respiratory cause (COPD, asthma, PE, or PNA), medication noncompliance or dietary indiscretion, alcohol or drug abuse, endocrine (thyrotoxicosis).  Consider CT angio with contrast to rule out PE however patient has CKD so CT angio with contrast cannot be done at this point.  Can consider V/Q scan while inpatient.  The patient was given lasix and admitted for acute management of CHF.  O2 sat is 93% on 4 L [place] at this point.  Patient does not wear oxygen at home. I have reviewed the patient home medicines and have made adjustments as needed.  Cardiac monitoring/EKG: The patient was maintained on a cardiac monitor.  I personally reviewed and interpreted the cardiac monitor which showed an underlying rhythm of: sinus rhythm.  Additional history obtained: External records from outside source obtained and reviewed including: Chart review including previous notes, labs, imaging.  Consultations obtained: I spoke to Dr. Loney Loh [facility] hospitalist.  She agreed to admit the patient.  Disposition Admit.  This chart was dictated using voice recognition software.  Despite best efforts to proofread,   errors can occur which can change the documentation meaning.          Final Clinical Impression(s) / ED Diagnoses Final diagnoses:  COPD exacerbation [facility])    Rx / DC Orders ED Discharge Orders     None         Jeanelle Malling, [place] 02/28/23 1105    Royanne Foots, DO 03/05/23 [phone]

## 2023-02-27 NOTE — ED Notes (Signed)
Patient transported to X-ray 

## 2023-02-28 ENCOUNTER — Inpatient Hospital Stay (HOSPITAL_COMMUNITY): Payer: Medicare HMO

## 2023-02-28 DIAGNOSIS — R0600 Dyspnea, unspecified: Secondary | ICD-10-CM | POA: Diagnosis not present

## 2023-02-28 DIAGNOSIS — D631 Anemia in chronic kidney disease: Secondary | ICD-10-CM | POA: Diagnosis present

## 2023-02-28 DIAGNOSIS — I509 Heart failure, unspecified: Secondary | ICD-10-CM | POA: Diagnosis not present

## 2023-02-28 DIAGNOSIS — I2510 Atherosclerotic heart disease of native coronary artery without angina pectoris: Secondary | ICD-10-CM | POA: Diagnosis present

## 2023-02-28 DIAGNOSIS — E119 Type 2 diabetes mellitus without complications: Secondary | ICD-10-CM | POA: Diagnosis not present

## 2023-02-28 DIAGNOSIS — I161 Hypertensive emergency: Secondary | ICD-10-CM | POA: Diagnosis present

## 2023-02-28 DIAGNOSIS — M19011 Primary osteoarthritis, right shoulder: Secondary | ICD-10-CM | POA: Diagnosis not present

## 2023-02-28 DIAGNOSIS — I43 Cardiomyopathy in diseases classified elsewhere: Secondary | ICD-10-CM | POA: Diagnosis present

## 2023-02-28 DIAGNOSIS — I482 Chronic atrial fibrillation, unspecified: Secondary | ICD-10-CM | POA: Diagnosis not present

## 2023-02-28 DIAGNOSIS — N2581 Secondary hyperparathyroidism of renal origin: Secondary | ICD-10-CM | POA: Diagnosis present

## 2023-02-28 DIAGNOSIS — R809 Proteinuria, unspecified: Secondary | ICD-10-CM | POA: Diagnosis not present

## 2023-02-28 DIAGNOSIS — R531 Weakness: Secondary | ICD-10-CM | POA: Diagnosis not present

## 2023-02-28 DIAGNOSIS — E161 Other hypoglycemia: Secondary | ICD-10-CM | POA: Diagnosis not present

## 2023-02-28 DIAGNOSIS — M25511 Pain in right shoulder: Secondary | ICD-10-CM | POA: Diagnosis not present

## 2023-02-28 DIAGNOSIS — M7989 Other specified soft tissue disorders: Secondary | ICD-10-CM

## 2023-02-28 DIAGNOSIS — C61 Malignant neoplasm of prostate: Secondary | ICD-10-CM | POA: Diagnosis present

## 2023-02-28 DIAGNOSIS — E162 Hypoglycemia, unspecified: Secondary | ICD-10-CM | POA: Insufficient documentation

## 2023-02-28 DIAGNOSIS — N184 Chronic kidney disease, stage 4 (severe): Secondary | ICD-10-CM

## 2023-02-28 DIAGNOSIS — E1140 Type 2 diabetes mellitus with diabetic neuropathy, unspecified: Secondary | ICD-10-CM | POA: Diagnosis present

## 2023-02-28 DIAGNOSIS — I959 Hypotension, unspecified: Secondary | ICD-10-CM | POA: Diagnosis not present

## 2023-02-28 DIAGNOSIS — Y712 Prosthetic and other implants, materials and accessory cardiovascular devices associated with adverse incidents: Secondary | ICD-10-CM | POA: Diagnosis present

## 2023-02-28 DIAGNOSIS — G4733 Obstructive sleep apnea (adult) (pediatric): Secondary | ICD-10-CM | POA: Diagnosis present

## 2023-02-28 DIAGNOSIS — I480 Paroxysmal atrial fibrillation: Secondary | ICD-10-CM

## 2023-02-28 DIAGNOSIS — I5023 Acute on chronic systolic (congestive) heart failure: Secondary | ICD-10-CM | POA: Diagnosis not present

## 2023-02-28 DIAGNOSIS — E559 Vitamin D deficiency, unspecified: Secondary | ICD-10-CM | POA: Diagnosis not present

## 2023-02-28 DIAGNOSIS — G8929 Other chronic pain: Secondary | ICD-10-CM

## 2023-02-28 DIAGNOSIS — G250 Essential tremor: Secondary | ICD-10-CM | POA: Diagnosis present

## 2023-02-28 DIAGNOSIS — D61818 Other pancytopenia: Secondary | ICD-10-CM | POA: Diagnosis present

## 2023-02-28 DIAGNOSIS — I10 Essential (primary) hypertension: Secondary | ICD-10-CM

## 2023-02-28 DIAGNOSIS — K5900 Constipation, unspecified: Secondary | ICD-10-CM | POA: Diagnosis not present

## 2023-02-28 DIAGNOSIS — Z1152 Encounter for screening for COVID-19: Secondary | ICD-10-CM | POA: Diagnosis not present

## 2023-02-28 DIAGNOSIS — N179 Acute kidney failure, unspecified: Secondary | ICD-10-CM

## 2023-02-28 DIAGNOSIS — T82868A Thrombosis of vascular prosthetic devices, implants and grafts, initial encounter: Secondary | ICD-10-CM | POA: Diagnosis present

## 2023-02-28 DIAGNOSIS — M10361 Gout due to renal impairment, right knee: Secondary | ICD-10-CM | POA: Diagnosis not present

## 2023-02-28 DIAGNOSIS — Z7401 Bed confinement status: Secondary | ICD-10-CM | POA: Diagnosis not present

## 2023-02-28 DIAGNOSIS — I4892 Unspecified atrial flutter: Secondary | ICD-10-CM | POA: Diagnosis present

## 2023-02-28 DIAGNOSIS — I130 Hypertensive heart and chronic kidney disease with heart failure and stage 1 through stage 4 chronic kidney disease, or unspecified chronic kidney disease: Secondary | ICD-10-CM | POA: Diagnosis present

## 2023-02-28 DIAGNOSIS — R7989 Other specified abnormal findings of blood chemistry: Secondary | ICD-10-CM | POA: Insufficient documentation

## 2023-02-28 DIAGNOSIS — D649 Anemia, unspecified: Secondary | ICD-10-CM | POA: Diagnosis not present

## 2023-02-28 DIAGNOSIS — J9601 Acute respiratory failure with hypoxia: Secondary | ICD-10-CM | POA: Diagnosis present

## 2023-02-28 DIAGNOSIS — M6281 Muscle weakness (generalized): Secondary | ICD-10-CM | POA: Diagnosis not present

## 2023-02-28 DIAGNOSIS — E1122 Type 2 diabetes mellitus with diabetic chronic kidney disease: Secondary | ICD-10-CM | POA: Diagnosis present

## 2023-02-28 DIAGNOSIS — E854 Organ-limited amyloidosis: Secondary | ICD-10-CM | POA: Diagnosis present

## 2023-02-28 DIAGNOSIS — E785 Hyperlipidemia, unspecified: Secondary | ICD-10-CM | POA: Diagnosis present

## 2023-02-28 DIAGNOSIS — E873 Alkalosis: Secondary | ICD-10-CM | POA: Diagnosis not present

## 2023-02-28 DIAGNOSIS — M778 Other enthesopathies, not elsewhere classified: Secondary | ICD-10-CM | POA: Diagnosis not present

## 2023-02-28 DIAGNOSIS — E11649 Type 2 diabetes mellitus with hypoglycemia without coma: Secondary | ICD-10-CM | POA: Diagnosis not present

## 2023-02-28 DIAGNOSIS — R0602 Shortness of breath: Secondary | ICD-10-CM | POA: Diagnosis present

## 2023-02-28 LAB — COMPREHENSIVE METABOLIC PANEL
ALT: 17 U/L (ref 0–44)
AST: 39 U/L (ref 15–41)
Albumin: 2.7 g/dL — ABNORMAL LOW (ref 3.5–5.0)
Alkaline Phosphatase: 88 U/L (ref 38–126)
Anion gap: 12 (ref 5–15)
BUN: 72 mg/dL — ABNORMAL HIGH (ref 8–23)
CO2: 23 mmol/L (ref 22–32)
Calcium: 9.1 mg/dL (ref 8.9–10.3)
Chloride: 104 mmol/L (ref 98–111)
Creatinine, Ser: 3.12 mg/dL — ABNORMAL HIGH (ref 0.61–1.24)
GFR, Estimated: 20 mL/min — ABNORMAL LOW (ref >60)
Glucose, Bld: 112 mg/dL — ABNORMAL HIGH (ref 70–99)
Potassium: 4.2 mmol/L (ref 3.5–5.1)
Sodium: 139 mmol/L (ref 135–145)
Total Bilirubin: 1.5 mg/dL — ABNORMAL HIGH (ref 0.3–1.2)
Total Protein: 6.5 g/dL (ref 6.5–8.1)

## 2023-02-28 LAB — GLUCOSE, CAPILLARY
Glucose-Capillary: 103 mg/dL — ABNORMAL HIGH (ref 70–99)
Glucose-Capillary: 117 mg/dL — ABNORMAL HIGH (ref 70–99)
Glucose-Capillary: 126 mg/dL — ABNORMAL HIGH (ref 70–99)
Glucose-Capillary: 128 mg/dL — ABNORMAL HIGH (ref 70–99)
Glucose-Capillary: 30 mg/dL — CL (ref 70–99)
Glucose-Capillary: 53 mg/dL — ABNORMAL LOW (ref 70–99)
Glucose-Capillary: 64 mg/dL — ABNORMAL LOW (ref 70–99)
Glucose-Capillary: 71 mg/dL (ref 70–99)
Glucose-Capillary: 88 mg/dL (ref 70–99)

## 2023-02-28 LAB — ECHOCARDIOGRAM COMPLETE
Calc EF: 20.8 %
Height: 66 in
S' Lateral: 4.3 cm
Single Plane A2C EF: 21.9 %
Single Plane A4C EF: 16.8 %
Weight: 3005.31 [oz_av]

## 2023-02-28 LAB — CBC
HCT: 40 % (ref 39.0–52.0)
Hemoglobin: 12.2 g/dL — ABNORMAL LOW (ref 13.0–17.0)
MCH: 25.3 pg — ABNORMAL LOW (ref 26.0–34.0)
MCHC: 30.5 g/dL (ref 30.0–36.0)
MCV: 83 fL (ref 80.0–100.0)
Platelets: 157 10*3/uL (ref 150–400)
RBC: 4.82 MIL/uL (ref 4.22–5.81)
RDW: 20.4 % — ABNORMAL HIGH (ref 11.5–15.5)
WBC: 2.4 10*3/uL — ABNORMAL LOW (ref 4.0–10.5)
nRBC: 0 % (ref 0.0–0.2)

## 2023-02-28 LAB — MRSA NEXT GEN BY PCR, NASAL: MRSA by PCR Next Gen: NOT DETECTED

## 2023-02-28 LAB — PROCALCITONIN: Procalcitonin: 0.46 ng/mL

## 2023-02-28 MED ORDER — CHLORHEXIDINE GLUCONATE 4 % EX SOLN: 60.0000 mL | Freq: Once | CUTANEOUS | Status: DC

## 2023-02-28 MED ORDER — SODIUM CHLORIDE 0.9 % IV SOLN: INTRAVENOUS | Status: DC

## 2023-02-28 MED ORDER — ROSUVASTATIN CALCIUM 5 MG PO TABS
5.0000 mg | ORAL_TABLET | Freq: Every day | ORAL | Status: DC
  Administered 2023-02-28 – 2023-03-13 (×14): 5 mg via ORAL
  Filled 2023-02-28 (×14): qty 1

## 2023-02-28 MED ORDER — ALLOPURINOL 100 MG PO TABS
50.0000 mg | ORAL_TABLET | Freq: Every day | ORAL | Status: DC
  Administered 2023-02-28 – 2023-03-13 (×14): 50 mg via ORAL
  Filled 2023-02-28 (×14): qty 1

## 2023-02-28 MED ORDER — GLUCOSE 40 % PO GEL: 1.0000 | ORAL | Status: AC

## 2023-02-28 MED ORDER — ACETAMINOPHEN 325 MG PO TABS
650.0000 mg | ORAL_TABLET | Freq: Four times a day (QID) | ORAL | Status: DC | PRN
  Administered 2023-03-04 – 2023-03-12 (×7): 650 mg via ORAL
  Filled 2023-02-28 (×8): qty 2

## 2023-02-28 MED ORDER — SODIUM BICARBONATE 650 MG PO TABS
650.0000 mg | ORAL_TABLET | Freq: Two times a day (BID) | ORAL | Status: DC
  Administered 2023-02-28 – 2023-03-01 (×3): 650 mg via ORAL
  Filled 2023-02-28 (×3): qty 1

## 2023-02-28 MED ORDER — DEXTROSE 50 % IV SOLN
INTRAVENOUS | Status: AC
  Administered 2023-02-28: 50 mL via INTRAVENOUS
  Filled 2023-02-28: qty 50

## 2023-02-28 MED ORDER — CEFAZOLIN SODIUM-DEXTROSE 2-4 GM/100ML-% IV SOLN: 2.0000 g | INTRAVENOUS | Status: DC

## 2023-02-28 MED ORDER — APIXABAN 5 MG PO TABS
5.0000 mg | ORAL_TABLET | Freq: Two times a day (BID) | ORAL | Status: DC
  Administered 2023-02-28 – 2023-03-13 (×27): 5 mg via ORAL
  Filled 2023-02-28 (×27): qty 1

## 2023-02-28 MED ORDER — DEXTROSE 50 % IV SOLN
1.0000 | INTRAVENOUS | Status: DC | PRN
  Filled 2023-02-28: qty 50

## 2023-02-28 MED ORDER — PERFLUTREN LIPID MICROSPHERE
1.0000 mL | INTRAVENOUS | Status: AC | PRN
  Administered 2023-02-28: 4 mL via INTRAVENOUS

## 2023-02-28 MED ORDER — HEPARIN (PORCINE) 25000 UT/250ML-% IV SOLN
1300.0000 [IU]/h | INTRAVENOUS | Status: DC
  Administered 2023-02-28: 1300 [IU]/h via INTRAVENOUS
  Filled 2023-02-28: qty 250

## 2023-02-28 MED ORDER — TAFAMIDIS 61 MG PO CAPS: 61.0000 mg | ORAL_CAPSULE | Freq: Every day | ORAL | Status: DC

## 2023-02-28 MED ORDER — FUROSEMIDE 10 MG/ML IJ SOLN
80.0000 mg | Freq: Two times a day (BID) | INTRAMUSCULAR | Status: DC
  Administered 2023-02-28 – 2023-03-01 (×3): 80 mg via INTRAVENOUS
  Filled 2023-02-28 (×3): qty 8

## 2023-02-28 MED ORDER — HYDRALAZINE HCL 25 MG PO TABS
25.0000 mg | ORAL_TABLET | Freq: Three times a day (TID) | ORAL | Status: DC
  Administered 2023-02-28 – 2023-03-01 (×4): 25 mg via ORAL
  Filled 2023-02-28 (×4): qty 1

## 2023-02-28 MED ORDER — ACETAMINOPHEN 650 MG RE SUPP: 650.0000 mg | Freq: Four times a day (QID) | RECTAL | Status: DC | PRN

## 2023-02-28 MED ORDER — TECHNETIUM TO 99M ALBUMIN AGGREGATED
4.2000 | Freq: Once | INTRAVENOUS | Status: AC | PRN
  Administered 2023-02-28: 4.2 via INTRAVENOUS

## 2023-02-28 MED ORDER — GUAIFENESIN-DM 100-10 MG/5ML PO SYRP
5.0000 mL | ORAL_SOLUTION | ORAL | Status: DC | PRN
  Administered 2023-02-28 – 2023-03-01 (×2): 5 mL via ORAL
  Filled 2023-02-28 (×2): qty 5

## 2023-02-28 NOTE — ED Notes (Signed)
Report given to and received by [facility] (RN) receiving nurse

## 2023-02-28 NOTE — Assessment & Plan Note (Addendum)
Echocardiogram reduced LV systolic function with EF 20 to 25%, global hypokinesis, severe LVH, RV with severe reduction in systolic function, RV mild enlargement, RVSP 48,1 LA with mild dilatation, RA with moderate dilatation,   Urine output 2,825 cc Systolic blood pressure 124 to 108 mmHg.  Volume status is improving.    Continue with furosemide to 80 mg IV q8 hrs 10/17 metolazone 2.5 mg  Holding on hydralazine to avoid hypotension.  Limited pharmacologic options due to acute worsening GFR.   Acute hypoxemic respiratory due to acute cardiogenic pulmonary edema.  V/Q scan with no evidence of pulmonary embolism.   Plan to continue diuresis with IV furosemide. 02 saturation is 100 on 2 L/min per [place].   Cardiac amyloidosis, continue with tafamidis.

## 2023-02-28 NOTE — Assessment & Plan Note (Addendum)
Urine output and volume status is improving. Pending blood work this morning.   Continue diuretic therapy with furosemide. Avoid hypotension.

## 2023-02-28 NOTE — Assessment & Plan Note (Addendum)
Continue to hold on hydralazine to avoid hypotension.

## 2023-02-28 NOTE — Plan of Care (Signed)
  Problem: Education: Goal: Knowledge of cardiac device and self-care will improve Outcome: Progressing   Problem: Education: Goal: Knowledge of General Education information will improve Description: Including pain rating scale, medication(s)/side effects and non-pharmacologic comfort measures Outcome: Progressing   Problem: Activity: Goal: Risk for activity intolerance will decrease Outcome: Progressing   Problem: Coping: Goal: Level of anxiety will decrease Outcome: Progressing   Problem: Pain Managment: Goal: General experience of comfort will improve Outcome: Progressing   Problem: Safety: Goal: Ability to remain free from injury will improve Outcome: Progressing

## 2023-02-28 NOTE — Progress Notes (Signed)
Hypoglycemic Event  CBG: 50  Treatment: 4oz of OJ  Symptoms: None noted  Follow-up CBG: Time:1633 CBG Result:103  Possible Reasons for Event: inadequate meal intake due to being off unit for procedures  Comments/MD notified:Arrien notified and new orders placed if need be     Ileana Ladd

## 2023-02-28 NOTE — ED Notes (Signed)
ED TO INPATIENT HANDOFF REPORT  ED Nurse Name and Phone #: France Ravens [phone]  S Name/Age/Gender Corey [name] 77 y.o. male Room/Bed: 001C/001C  Code Status   Code Status: Prior  Home/SNF/Other Home Patient oriented to: self, place, time, and situation Is this baseline? Yes   Triage Complete: Triage complete  Chief Complaint Acute exacerbation of CHF (congestive heart failure) (HCC) [I50.9]  Triage Note PT was bib [facility] from cardiology office.PT was there to get a fistula put in but they noticed that his abdomen was extended, legs were swelling and weeping.PT is on torsemide and compliant but its not working.PT reports no cp but sob on exertion and hasn't been able to lay flat in a long time.Poor perfusion in fingers. PT has been constipated , took a laxative and had a bm yesterday   Allergies Allergies  Allergen Reactions   Hydrochlorothiazide Other (See Comments)    Gout , uncontrolled diabetes and renal insufficiency    Level of Care/Admitting Diagnosis ED Disposition     ED Disposition  Admit   Condition  --   Comment  Hospital Area: [facility] [[id number]]  Level of Care: Progressive [102]  Admit to Progressive based on following criteria: CARDIOVASCULAR & THORACIC of moderate stability with acute coronary syndrome symptoms/low risk myocardial infarction/hypertensive urgency/arrhythmias/heart failure potentially compromising stability and stable post cardiovascular intervention patients.  May admit patient to [facility] or [facility] if equivalent level of care is available:: Yes  Covid Evaluation: Asymptomatic - no recent exposure (last 10 days) testing not required  Diagnosis: Acute exacerbation of CHF (congestive heart failure) [facility]) [[id number]]  Admitting Physician: John Giovanni [[id number]]  Attending Physician: John Giovanni [[id number]]  Certification:: I certify this patient will need inpatient services for at least 2 midnights  Expected  Medical Readiness: 03/02/2023          B Medical/Surgery History Past Medical History:  Diagnosis Date   Cataract    CHF (congestive heart failure) (HCC)    Chronic heart failure with preserved ejection fraction (HFpEF) (HCC)    Colon polyp 2003   Dr Victorino Dike; F/U was to be 2008( not completed)   Coronary artery disease    CVA (cerebral infarction) 2014   Diabetes mellitus    Diverticulosis 2003   Dyspnea    with exertion   Gout    Hereditary cardiac amyloidosis (HCC)    Hypertension    Myocardial infarction (HCC) 09/19/2012   PEA cardiac arrest in setting of acute respiratory failure/pulmonary edema   OSA (obstructive sleep apnea) 03/27/2018   Pneumonia 09/29/2011   Avelox X 10 days as OP   Prostate cancer (HCC)    Renal insufficiency    Seizures (HCC) 09/19/2012   not treated for seizure disorder; had a seizure after stroke 2014; no seizure since then   Stroke [facility])    Past Surgical History:  Procedure Laterality Date   A-FLUTTER ABLATION N/A 06/23/2022   Procedure: A-FLUTTER ABLATION;  Surgeon: Maurice Small, MD;  Location: [facility] INVASIVE CV LAB;  Service: Cardiovascular;  Laterality: N/A;   A/V FISTULAGRAM Left 04/28/2022   Procedure: A/V Fistulagram;  Surgeon: Chuck Hint, MD;  Location: [facility] INVASIVE CV LAB;  Service: Cardiovascular;  Laterality: Left;   AV FISTULA PLACEMENT Left 01/31/2022   Procedure: LEFT ARM ARTERIOVENOUS (AV) FISTULA CREATION;  Surgeon: Maeola Harman, MD;  Location: [facility] OR;  Service: Vascular;  Laterality: Left;  regional block to left arm   CARDIOVERSION N/A 03/24/2022  Procedure: CARDIOVERSION;  Surgeon: Dorthula Nettles, DO;  Location: [facility] ENDOSCOPY;  Service: Cardiovascular;  Laterality: N/A;   COLONOSCOPY W/ POLYPECTOMY  2003   no F/U (SOC discussed 12/03/12)   INGUINAL HERNIA REPAIR Left 06/16/2021   Procedure: LAPAROSCOPIC, POSSIBLY OPEN LEFT INGUINAL HERNIA REPAIR WITH MESH;  Surgeon: Quentin Ore, MD;   Location: [facility] ORS;  Service: General;  Laterality: Left;   INSERTION OF MESH N/A 07/16/2017   Procedure: INSERTION OF MESH;  Surgeon: Berna Bue, MD;  Location: [facility] OR;  Service: General;  Laterality: N/A;   PACEMAKER IMPLANT N/A 12/28/2022   Procedure: PACEMAKER IMPLANT;  Surgeon: Maurice Small, MD;  Location: [facility] INVASIVE CV LAB;  Service: Cardiovascular;  Laterality: N/A;   PEG PLACEMENT  2014   10/08/12-12/10/12   PEG TUBE REMOVAL  2014   PERIPHERAL VASCULAR BALLOON ANGIOPLASTY  04/28/2022   Procedure: PERIPHERAL VASCULAR BALLOON ANGIOPLASTY;  Surgeon: Chuck Hint, MD;  Location: [facility] INVASIVE CV LAB;  Service: Cardiovascular;;   PROSTATE BIOPSY     RIGHT HEART CATH N/A 03/24/2022   Procedure: RIGHT HEART CATH;  Surgeon: Dorthula Nettles, DO;  Location: [facility] INVASIVE CV LAB;  Service: Cardiovascular;  Laterality: N/A;   RIGHT/LEFT HEART CATH AND CORONARY ANGIOGRAPHY N/A 09/20/2020   Procedure: RIGHT/LEFT HEART CATH AND CORONARY ANGIOGRAPHY;  Surgeon: Marykay Lex, MD;  Location: [facility] INVASIVE CV LAB;  Service: Cardiovascular;  Laterality: N/A;   SUBQ ICD IMPLANT N/A 09/04/2022   Procedure: SUBQ ICD IMPLANT;  Surgeon: Maurice Small, MD;  Location: [facility] INVASIVE CV LAB;  Service: Cardiovascular;  Laterality: N/A;   TEE WITHOUT CARDIOVERSION N/A 09/27/2020   Procedure: TRANSESOPHAGEAL ECHOCARDIOGRAM (TEE);  Surgeon: Linden Dolin, MD;  Location: [facility] OR;  Service: Thoracic;  Laterality: N/A;   TEE WITHOUT CARDIOVERSION N/A 03/24/2022   Procedure: TRANSESOPHAGEAL ECHOCARDIOGRAM (TEE);  Surgeon: Dorthula Nettles, DO;  Location: [facility] ENDOSCOPY;  Service: Cardiovascular;  Laterality: N/A;   TEE WITHOUT CARDIOVERSION N/A 06/23/2022   Procedure: TRANSESOPHAGEAL ECHOCARDIOGRAM (TEE);  Surgeon: Maurice Small, MD;  Location: [facility] INVASIVE CV LAB;  Service: Cardiovascular;  Laterality: N/A;   TRACHEOSTOMY  2014   09/30/12-10/20/12   VENTRAL HERNIA REPAIR N/A 07/16/2017   Procedure:  LAPAROSCOPIC VENTRAL HERNIA REPAIR WITH MESH;  Surgeon: Berna Bue, MD;  Location: [facility] OR;  Service: General;  Laterality: N/A;   wrist aspiration  02/16/2012    monosodium urate crystals; Dr Timmie Foerster IV Location/Drains/Wounds Patient Lines/Drains/Airways Status     Active Line/Drains/Airways     Name Placement date Placement time Site Days   Peripheral IV 12/26/22 20 G Posterior;Right Forearm 12/26/22  1257  Forearm  64   Peripheral IV 02/27/23 18 G 2.5" Anterior;Proximal;Right;Upper Arm 02/27/23  2123  Arm  1   Fistula / Graft Left Upper arm Arteriovenous fistula 01/31/22  1019  Upper arm  393   Chest Tube 1 Lateral Mediastinal 28 Fr. 09/27/20  1345  Mediastinal  884   Incision - 3 Ports Abdomen Left;Upper Umbilicus Right;Lower 06/16/21  0753  -- 622   Wound / Incision (Open or Dehisced) 03/23/22 Other (Comment) Pretibial Right 03/23/22  1813  Pretibial  342            Intake/Output Last 24 hours No intake or output data in the 24 hours ending 02/28/23 0114  Labs/Imaging Results for orders placed or performed during the hospital encounter of 02/27/23 (from the past 48 hour(s))  Resp panel by RT-PCR (RSV,  Flu A&B, Covid) Anterior Nasal Swab     Status: None   Collection Time: 02/27/23  5:30 PM   Specimen: Anterior Nasal Swab  Result Value Ref Range   SARS Coronavirus 2 by RT PCR NEGATIVE NEGATIVE   Influenza A by PCR NEGATIVE NEGATIVE   Influenza B by PCR NEGATIVE NEGATIVE    Comment: (NOTE) The Xpert Xpress SARS-CoV-2/FLU/RSV plus assay is intended as an aid in the diagnosis of influenza from Nasopharyngeal swab specimens and should not be used as a sole basis for treatment. Nasal washings and aspirates are unacceptable for Xpert Xpress SARS-CoV-2/FLU/RSV testing.  Fact Sheet for Patients: BloggerCourse.com  Fact Sheet for Healthcare Providers: SeriousBroker.it  This test is not yet approved or cleared by  the [place] FDA and has been authorized for detection and/or diagnosis of SARS-CoV-2 by FDA under an Emergency Use Authorization (EUA). This EUA will remain in effect (meaning this test can be used) for the duration of the COVID-19 declaration under Section 564(b)(1) of the Act, 21 U.S.C. section 360bbb-3(b)(1), unless the authorization is terminated or revoked.     Resp Syncytial Virus by PCR NEGATIVE NEGATIVE    Comment: (NOTE) Fact Sheet for Patients: BloggerCourse.com  Fact Sheet for Healthcare Providers: SeriousBroker.it  This test is not yet approved or cleared by the [place] FDA and has been authorized for detection and/or diagnosis of SARS-CoV-2 by FDA under an Emergency Use Authorization (EUA). This EUA will remain in effect (meaning this test can be used) for the duration of the COVID-19 declaration under Section 564(b)(1) of the Act, 21 U.S.C. section 360bbb-3(b)(1), unless the authorization is terminated or revoked.  Performed at [facility], [address], [place], [place] [address]   CBC     Status: Abnormal   Collection Time: 02/27/23  5:36 PM  Result Value Ref Range   WBC 2.3 (L) 4.0 - 10.5 K/uL   RBC 4.55 4.22 - 5.81 MIL/uL   Hemoglobin 11.6 (L) 13.0 - 17.0 g/dL   HCT 65.7 (L) 84.6 - 96.2 %   MCV 83.7 80.0 - 100.0 fL   MCH 25.5 (L) 26.0 - 34.0 pg   MCHC 30.4 30.0 - 36.0 g/dL   RDW 95.2 (H) 84.1 - 32.4 %   Platelets 140 (L) 150 - 400 K/uL    Comment: SPECIMEN CHECKED FOR CLOTS REPEATED TO VERIFY    nRBC 0.0 0.0 - 0.2 %    Comment: Performed at [facility], [address], [place], [place] [address]  Brain natriuretic peptide     Status: Abnormal   Collection Time: 02/27/23  5:36 PM  Result Value Ref Range   B Natriuretic Peptide 1,686.8 (H) 0.0 - 100.0 pg/mL    Comment: Performed at [facility], [address] [place], [place] [address]  D-dimer, quantitative      Status: Abnormal   Collection Time: 02/27/23  7:08 PM  Result Value Ref Range   D-Dimer, Quant 13.86 (H) 0.00 - 0.50 ug/mL-FEU    Comment: (NOTE) At the manufacturer cut-off value of 0.5 g/mL FEU, this assay has a negative predictive value of 95-100%.This assay is intended for use in conjunction with a clinical pretest probability (PTP) assessment model to exclude pulmonary embolism (PE) and deep venous thrombosis (DVT) in outpatients suspected of PE or DVT. Results should be correlated with clinical presentation. Performed at [facility], [address] [place], [place] [address]   Comprehensive metabolic panel  Status: Abnormal   Collection Time: 02/27/23 10:01 PM  Result Value Ref Range   Sodium 141 135 - 145 mmol/L   Potassium 4.3 3.5 - 5.1 mmol/L   Chloride 105 98 - 111 mmol/L   CO2 26 22 - 32 mmol/L   Glucose, Bld 83 70 - 99 mg/dL    Comment: Glucose reference range applies only to samples taken after fasting for at least 8 hours.   BUN 73 (H) 8 - 23 mg/dL   Creatinine, Ser 4.13 (H) 0.61 - 1.24 mg/dL   Calcium 9.5 8.9 - 24.4 mg/dL   Total Protein 7.4 6.5 - 8.1 g/dL   Albumin 3.0 (L) 3.5 - 5.0 g/dL   AST 46 (H) 15 - 41 U/L   ALT 19 0 - 44 U/L   Alkaline Phosphatase 98 38 - 126 U/L   Total Bilirubin 1.7 (H) 0.3 - 1.2 mg/dL   GFR, Estimated 20 (L) >60 mL/min    Comment: (NOTE) Calculated using the CKD-EPI Creatinine Equation (2021)    Anion gap 10 5 - 15    Comment: Performed at [facility], [address], [place], [place] [address]  I-stat chem 8, ED (not at [facility], [facility] or [facility])     Status: Abnormal   Collection Time: 02/27/23 10:48 PM  Result Value Ref Range   Sodium 146 (H) 135 - 145 mmol/L   Potassium 3.5 3.5 - 5.1 mmol/L   Chloride 116 (H) 98 - 111 mmol/L   BUN 71 (H) 8 - 23 mg/dL   Creatinine, Ser 2.53 (H) 0.61 - 1.24 mg/dL   Glucose, Bld 55 (L) 70 - 99 mg/dL    Comment: Glucose reference range applies only to samples taken after fasting for at  least 8 hours.   Calcium, Ion 0.85 (LL) 1.15 - 1.40 mmol/L   TCO2 19 (L) 22 - 32 mmol/L   Hemoglobin 10.5 (L) 13.0 - 17.0 g/dL   HCT 66.4 (L) 40.3 - 47.4 %   Comment NOTIFIED PHYSICIAN    *Note: Due to a large number of results and/or encounters for the requested time period, some results have not been displayed. A complete set of results can be found in Results Review.   DG Chest 2 View  Result Date: 02/27/2023 CLINICAL DATA:  Shortness of breath EXAM: CHEST - 2 VIEW COMPARISON:  Chest radiograph 12/29/2022 FINDINGS: Bilateral chest wall cardiac device is are unchanged. The heart is enlarged, unchanged. The upper mediastinal contours are grossly stable Lung volumes are markedly low with bibasilar airspace opacities likely reflecting pleural effusions and adjacent airspace opacity which may reflect atelectasis or infection. There is probable mild pulmonary interstitial edema. Findings are worsened since the prior study. There is no definite pneumothorax. There is no acute osseous abnormality. IMPRESSION: Cardiomegaly with probable mild pulmonary interstitial edema and left larger than right pleural effusions with adjacent airspace opacity which may reflect atelectasis or infection. Findings are worsened since the prior study. Electronically Signed   By: Lesia Hausen M.D.   On: 02/27/2023 20:06    Pending Labs Unresulted Labs (From admission, onward)    None       Vitals/Pain Today's Vitals   02/27/23 2045 02/27/23 2100 02/27/23 2115 02/28/23 0055  BP: (!) 143/107 (!) 131/109 (!) 139/102 (!) 136/104  Pulse: 67 73 78 72  Resp: (!) 26 (!) 30 (!) 23 (!) 24  Temp:    (!) 97.4 F (36.3 C)  TempSrc:    Oral  SpO2:  95% 91% 96% 100%  Weight:      Height:      PainSc:        Isolation Precautions No active isolations  Medications Medications  furosemide (LASIX) injection 40 mg (40 mg Intravenous Given 02/27/23 1742)    Mobility walks with person assist     Focused  Assessments    R Recommendations: See Admitting Provider Note  Report given to:   Additional Notes:  L arm restricted

## 2023-02-28 NOTE — Progress Notes (Addendum)
PHARMACY - ANTICOAGULATION CONSULT NOTE  Pharmacy Consult for heparin Indication: potential pulmonary embolus, hx of afib (eliquis)  Allergies  Allergen Reactions   Hydrochlorothiazide Other (See Comments)    Gout , uncontrolled diabetes and renal insufficiency    Patient Measurements: Height: 5\' 6"  (167.6 cm) Weight: 86.7 kg (191 lb 2.2 oz) IBW/kg (Calculated) : 63.8 Heparin Dosing Weight: 81.8 kg  Vital Signs: Temp: 97.4 F (36.3 C) (10/16 0055) Temp Source: Oral (10/16 0055) BP: 136/104 (10/16 0055) Pulse Rate: 72 (10/16 0055)  Labs: Recent Labs    02/27/23 1736 02/27/23 2201 02/27/23 2248  HGB 11.6*  --  10.5*  HCT 38.1*  --  31.0*  PLT 140*  --   --   CREATININE  --  3.14* 2.30*    Estimated Creatinine Clearance: 29.1 mL/min (A) (by C-G formula based on SCr of 2.3 mg/dL (H)).   Medical History: Past Medical History:  Diagnosis Date   Cataract    CHF (congestive heart failure) (HCC)    Chronic heart failure with preserved ejection fraction (HFpEF) (HCC)    Colon polyp 2003   Dr Victorino Dike; F/U was to be 2008( not completed)   Coronary artery disease    CVA (cerebral infarction) 2014   Diabetes mellitus    Diverticulosis 2003   Dyspnea    with exertion   Gout    Hereditary cardiac amyloidosis (HCC)    Hypertension    Myocardial infarction (HCC) 09/19/2012   PEA cardiac arrest in setting of acute respiratory failure/pulmonary edema   OSA (obstructive sleep apnea) 03/27/2018   Pneumonia 09/29/2011   Avelox X 10 days as OP   Prostate cancer (HCC)    Renal insufficiency    Seizures (HCC) 09/19/2012   not treated for seizure disorder; had a seizure after stroke 2014; no seizure since then   Stroke [facility])    Assessment: 89 yoM presented to Ed with shortness of breath/heart failure exacerbation. Pharmacy consulted to dose heparin for potential PE. PMH includes ATTR, HTN, HLD, HFpEF, ESRD (not on HD), afib/a. Flutter (on Eliquis), s/p pacemaker placement  on 8/16, hx of CVA, prostate cancer  Hgb 10.5, plts 140, d-dimer 13.86, apixaban for history of afib. Last dose of eliquis on 10/15 AM  Goal of Therapy:  Heparin level 0.3-0.7 units/ml aPTT 66-102 seconds Monitor platelets by anticoagulation protocol: Yes   Plan:  No bolus given recent eliquis administration Start heparin infusion at 1300 units/hr Check anti-Xa and aPTT level in 8 hours until level correlate Check anti-Xa level daily while on heparin Continue to monitor H&H and platelets  Arabella Merles, PharmD. Clinical Pharmacist 02/28/2023 1:26 AM

## 2023-02-28 NOTE — Progress Notes (Signed)
Heart Failure Navigator Progress Note  Assessed for Heart & Vascular TOC clinic readiness.  Patient does not meet criteria due to Advanced Heart Failure Team patient of Dr. Gasper Lloyd.   Navigator will sign off at this time.   Rhae Hammock, BSN, Scientist, clinical (histocompatibility and immunogenetics) Only

## 2023-02-28 NOTE — Assessment & Plan Note (Signed)
Radiograph with no fractures.  Positive degenerative changes, acromiohumeral abutment consistent with chronic rotator cuff arthropathy and likely degenerative rotator cuff tear.

## 2023-02-28 NOTE — Hospital Course (Addendum)
Corey [name] was admitted to the hospital with the working diagnosis of heart failure exacerbation.   77 yo male with the past medical history of cardiac amyloidosis, heart failure, pericardial effusion (sp pericardial window), atrial fibrillation, CVA, CKD stage IV, T2DM and prior cardiac arrest (PEA), who presented with dyspnea. Reported lower extremity edema and dyspnea for about 2 weeks, associated with orthopnea, cough, and 10 lbs weight gain. On the day of hospitalization he was evaluated at the cardiology clinic, pre op evaluation for HD access, he was found volume overloaded and was referred to the ED for further evaluation. On his initial physical examination his blood pressure was 139/102, HR 71, RR 29 and 02 saturation 96%, lungs with bilateral rales with no wheezing, heart with S1 and S2 present and regular, abdomen soft and positive lower extremity edema.   Na 141, K 4,3 CL 105, bicarbonate 26, glucose 83 bun 73, cr 3,14 AST 46, ALT 19, total bilirubin 1,7  Wbc 2,3 hgb 11,6 plt 140  Sars covid 19 negative   Chest radiograph with hypoinflation, cardiomegaly, bilateral hilar vascular congestion, small left pleural effusion, pacemaker with one right atrial and one right ventricular lead. Defibrillator in place.   EKG 64 bpm, right axis deviation, normal intervals, ventricular paced rhythm with underlying atrial flutter, with no significant ST segment or T wave changes.   10/17 patient placed on IV furosemide, continue volume overloaded.  10/18 improved diuresis.  10/19 tolerating well IV furosemide, but continue volume overloaded.  10/21 volume status has improved. Consulting vascular surgery for fistula thrombosis.

## 2023-02-28 NOTE — Assessment & Plan Note (Signed)
No chest pain, no acute coronary syndrome.

## 2023-02-28 NOTE — Progress Notes (Signed)
Patient's BS after being checked was 50, OJ given to patient and MD notified. Will recheck bs

## 2023-02-28 NOTE — H&P (Signed)
History and Physical    Corey [name] NFA:[id number] DOB: 01/18/1946 DOA: 02/27/2023  PCP: Pincus Sanes, MD  Patient coming from: Home  Chief Complaint: Shortness of breath  HPI: Corey [name] is a 77 y.o. male with medical history significant of cardiac amyloidosis (ATTR), chronic HFrEF, history of PEA arrest, pericardial effusion status post window, hypertension, hyperlipidemia, CKD stage IV, type 2 diabetes, history of CVA, A-fib/flutter status post CTI ablation in March 2024 on Eliquis, status post CRT placement in August 2024, gout, prostate cancer, seizures.  Patient was seen by cardiology yesterday for preop evaluation for AV fistula versus AV graft creation due to advanced CKD.  He was noted to be significantly volume overloaded with shortness of breath, orthopnea, and peripheral edema.  Noted to be hypoxic with oxygen saturation in the mid 80s on room air.  Weight up by 10 pounds since 02/14/2023.  His torsemide was recently increased to 20 mg twice daily with no improvement of symptoms.  He was sent to the ED for further evaluation and IV diuresis.  In the ED, patient noted to be hypoxic to the mid 80s on room air, placed on 2-3 L [place].  Afebrile.  Labs significant for WBC 2.3 (was 2.8 on labs done 2 months ago), hemoglobin 11.6 (was 14.3 on labs 2 months ago but previously in the 10-11 range), MCV 83.7, platelet count 140k (slightly low on labs in the past as well), potassium 4.3, bicarb 26, glucose 83, BUN 73, creatinine 3.1 (previously 2.5-2.8 in August 2024), T. bili 1.7, AST borderline elevated at 46, ALT and alk phos normal, D-dimer 13.86, BNP 1686, COVID/influenza/RSV PCR negative.  Chest x-ray showing mild pulmonary interstitial edema and L>R pleural effusions with adjacent airspace opacity which may reflect atelectasis or infection.  Findings worsened compared to previous study. Patient was given IV Lasix 40 mg and started on IV heparin in the ED.    Patient is reporting progressive  worsening shortness of breath and bilateral lower extremity edema over the past few weeks.  He is taking his home torsemide twice daily and has not missed any doses.  His right leg is more swollen than the left one and he denies history of blood clots in the past.  He is taking Eliquis regularly and denies missing any doses.  He is reporting cough and orthopnea.  He reports 2 falls over the past week due to generalized weakness.  Denies head injury or loss of consciousness.  He is reporting pain in his right shoulder since after his last fall.  Denies any other injuries from the falls.  Denies chest pain or pressure.  No other complaints.  Denies nausea, vomiting, abdominal pain, or diarrhea.  Review of Systems:  Review of Systems  All other systems reviewed and are negative.   Past Medical History:  Diagnosis Date   Cataract    CHF (congestive heart failure) (HCC)    Chronic heart failure with preserved ejection fraction (HFpEF) (HCC)    Colon polyp 2003   Dr Victorino Dike; F/U was to be 2008( not completed)   Coronary artery disease    CVA (cerebral infarction) 2014   Diabetes mellitus    Diverticulosis 2003   Dyspnea    with exertion   Gout    Hereditary cardiac amyloidosis (HCC)    Hypertension    Myocardial infarction (HCC) 09/19/2012   PEA cardiac arrest in setting of acute respiratory failure/pulmonary edema   OSA (obstructive sleep apnea) 03/27/2018  Pneumonia 09/29/2011   Avelox X 10 days as OP   Prostate cancer (HCC)    Renal insufficiency    Seizures (HCC) 09/19/2012   not treated for seizure disorder; had a seizure after stroke 2014; no seizure since then   Stroke [facility])     Past Surgical History:  Procedure Laterality Date   A-FLUTTER ABLATION N/A 06/23/2022   Procedure: A-FLUTTER ABLATION;  Surgeon: Maurice Small, MD;  Location: [facility] INVASIVE CV LAB;  Service: Cardiovascular;  Laterality: N/A;   A/V FISTULAGRAM Left 04/28/2022   Procedure: A/V Fistulagram;   Surgeon: Chuck Hint, MD;  Location: [facility] INVASIVE CV LAB;  Service: Cardiovascular;  Laterality: Left;   AV FISTULA PLACEMENT Left 01/31/2022   Procedure: LEFT ARM ARTERIOVENOUS (AV) FISTULA CREATION;  Surgeon: Maeola Harman, MD;  Location: [facility] OR;  Service: Vascular;  Laterality: Left;  regional block to left arm   CARDIOVERSION N/A 03/24/2022   Procedure: CARDIOVERSION;  Surgeon: Dorthula Nettles, DO;  Location: [facility] ENDOSCOPY;  Service: Cardiovascular;  Laterality: N/A;   COLONOSCOPY W/ POLYPECTOMY  2003   no F/U (SOC discussed 12/03/12)   INGUINAL HERNIA REPAIR Left 06/16/2021   Procedure: LAPAROSCOPIC, POSSIBLY OPEN LEFT INGUINAL HERNIA REPAIR WITH MESH;  Surgeon: Quentin Ore, MD;  Location: [facility] ORS;  Service: General;  Laterality: Left;   INSERTION OF MESH N/A 07/16/2017   Procedure: INSERTION OF MESH;  Surgeon: Berna Bue, MD;  Location: [facility] OR;  Service: General;  Laterality: N/A;   PACEMAKER IMPLANT N/A 12/28/2022   Procedure: PACEMAKER IMPLANT;  Surgeon: Maurice Small, MD;  Location: [facility] INVASIVE CV LAB;  Service: Cardiovascular;  Laterality: N/A;   PEG PLACEMENT  2014   10/08/12-12/10/12   PEG TUBE REMOVAL  2014   PERIPHERAL VASCULAR BALLOON ANGIOPLASTY  04/28/2022   Procedure: PERIPHERAL VASCULAR BALLOON ANGIOPLASTY;  Surgeon: Chuck Hint, MD;  Location: [facility] INVASIVE CV LAB;  Service: Cardiovascular;;   PROSTATE BIOPSY     RIGHT HEART CATH N/A 03/24/2022   Procedure: RIGHT HEART CATH;  Surgeon: Dorthula Nettles, DO;  Location: [facility] INVASIVE CV LAB;  Service: Cardiovascular;  Laterality: N/A;   RIGHT/LEFT HEART CATH AND CORONARY ANGIOGRAPHY N/A 09/20/2020   Procedure: RIGHT/LEFT HEART CATH AND CORONARY ANGIOGRAPHY;  Surgeon: Marykay Lex, MD;  Location[facility] INVASIVE CV LAB;  Service: Cardiovascular;  Laterality: N/A;   SUBQ ICD IMPLANT N/A 09/04/2022   Procedure: SUBQ ICD IMPLANT;  Surgeon: Maurice Small, MD;  Location: [facility] INVASIVE  CV LAB;  Service: Cardiovascular;  Laterality: N/A;   TEE WITHOUT CARDIOVERSION N/A 09/27/2020   Procedure: TRANSESOPHAGEAL ECHOCARDIOGRAM (TEE);  Surgeon: Linden Dolin, MD;  Location[facility] OR;  Service: Thoracic;  Laterality: N/A;   TEE WITHOUT CARDIOVERSION N/A 03/24/2022   Procedure: TRANSESOPHAGEAL ECHOCARDIOGRAM (TEE);  Surgeon: Dorthula Nettles, DO;  Location: [facility] ENDOSCOPY;  Service: Cardiovascular;  Laterality: N/A;   TEE WITHOUT CARDIOVERSION N/A 06/23/2022   Procedure: TRANSESOPHAGEAL ECHOCARDIOGRAM (TEE);  Surgeon: Maurice Small, MD;  Location: [facility] INVASIVE CV LAB;  Service: Cardiovascular;  Laterality: N/A;   TRACHEOSTOMY  2014   09/30/12-10/20/12   VENTRAL HERNIA REPAIR N/A 07/16/2017   Procedure: LAPAROSCOPIC VENTRAL HERNIA REPAIR WITH MESH;  Surgeon: Berna Bue, MD;  Location: [facility] OR;  Service: General;  Laterality: N/A;   wrist aspiration  02/16/2012    monosodium urate crystals; Dr Melvyn Novas     reports that he quit smoking about 54 years ago. His smoking use included cigarettes. He started smoking  about 55 years ago. He has a 0.3 pack-year smoking history. He has never used smokeless tobacco. He reports that he does not drink alcohol and does not use drugs.  Allergies  Allergen Reactions   Hydrochlorothiazide Other (See Comments)    Gout , uncontrolled diabetes and renal insufficiency    Family History  Problem Relation Age of Onset   Heart failure Mother    Hypertension Mother    Diabetes Mother    Hypertension Father    Prostate cancer Father    Diabetes Father    Cancer Father    Heart failure Brother    Stroke Neg Hx    Heart disease Neg Hx    Colon cancer Neg Hx     Prior to Admission medications   Medication Sig Start Date End Date Taking? Authorizing Provider  acetaminophen (TYLENOL) 500 MG tablet Take 1,000 mg by mouth every 8 (eight) hours as needed for moderate pain.   Yes [provider]  albuterol (VENTOLIN HFA) 108 (90 Base)  MCG/ACT inhaler Inhale 2 puffs into the lungs every 4 (four) hours as needed for wheezing or shortness of breath. 11/14/21  Yes Valinda Hoar, NP  allopurinol (ZYLOPRIM) 100 MG tablet Take 1/2 (one-half) tablet by mouth once daily 01/18/23  Yes Burns, [name], MD  apixaban (ELIQUIS) 5 MG TABS tablet Take 1 tablet by mouth twice daily 01/29/23  Yes Mealor, Roberts Gaudy, MD  bismuth subsalicylate (PEPTO BISMOL) 262 MG/15ML suspension Take 30 mLs by mouth every 6 (six) hours as needed for indigestion or diarrhea or loose stools.   Yes [provider]  cholecalciferol (VITAMIN D3) 25 MCG (1000 UNIT) tablet Take 1,000 Units by mouth daily.   Yes [provider]  colchicine (COLCRYS) 0.6 MG tablet Take 2 tabs once and then one tab one hour later as needed for gout Patient taking differently: Take 0.6 mg by mouth as needed (gout). 07/30/20  Yes Burns, [name], MD  hydrALAZINE (APRESOLINE) 25 MG tablet TAKE 1 TABLET THREE TIMES DAILY 12/07/22  Yes Burns, [name], MD  rosuvastatin (CRESTOR) 5 MG tablet Take 1 tablet (5 mg total) by mouth daily. 06/26/22  Yes Mealor, Roberts Gaudy, MD  sennosides-docusate sodium (SENOKOT-S) 8.6-50 MG tablet Take 2 tablets by mouth daily as needed for constipation.   Yes [provider]  sitaGLIPtin (JANUVIA) 50 MG tablet Take 0.5 tablets (25 mg total) by mouth daily. 01/02/23  Yes Reather Littler, MD  sodium bicarbonate 650 MG tablet TAKE 1 TABLET BY MOUTH TWICE DAILY . APPOINTMENT REQUIRED FOR FUTURE REFILLS 01/18/23  Yes Burns, [name], MD  Tafamidis [facility]) 61 MG CAPS Take 1 capsule (61 mg total) by mouth daily. 06/19/22  Yes Jake Bathe, MD  torsemide (DEMADEX) 20 MG tablet Take 20 mg by mouth 2 (two) times daily.   Yes [provider]  glucose blood (ACCU-CHEK AVIVA PLUS) test strip Use to check blood glucose 4 times a day. 12/25/22   Reather Littler, MD  TRUEplus Lancets 33G MISC USE 4 (FOUR) TIMES DAILY 04/04/21   Reather Littler, MD    Physical  Exam: Vitals:   02/27/23 2115 02/28/23 0055 02/28/23 0141 02/28/23 0237  BP: (!) 139/102 (!) 136/104 (!) 167/102 (!) 155/58  Pulse: 78 72 71   Resp: (!) 23 (!) 24 (!) 29 (!) 22  Temp:  (!) 97.4 F (36.3 C) (!) 96.9 F (36.1 C)   TempSrc:  Oral Axillary   SpO2: 96% 100% 96%  Weight:   85.2 kg   Height:   5\' 6"  (1.676 m)     Physical Exam Vitals reviewed.  Constitutional:      General: He is not in acute distress. HENT:     Head: Normocephalic and atraumatic.  Eyes:     Extraocular Movements: Extraocular movements intact.  Cardiovascular:     Rate and Rhythm: Normal rate and regular rhythm.     Pulses: Normal pulses.  Pulmonary:     Effort: No respiratory distress.     Breath sounds: Rales present. No wheezing.  Abdominal:     General: Bowel sounds are normal. There is no distension.     Palpations: Abdomen is soft.     Tenderness: There is no abdominal tenderness.  Musculoskeletal:     Cervical back: Normal range of motion.     Right lower leg: Edema present.     Left lower leg: Edema present.     Comments: Pitting edema of bilateral lower extremities, right lower extremity larger in size and slightly warm to touch  Skin:    General: Skin is warm and dry.  Neurological:     General: No focal deficit present.     Mental Status: He is alert and oriented to person, place, and time.     Cranial Nerves: No cranial nerve deficit.     Sensory: No sensory deficit.     Motor: No weakness.     Labs on Admission: I have personally reviewed following labs and imaging studies  CBC: Recent Labs  Lab 02/27/23 1736 02/27/23 2248  WBC 2.3*  --   HGB 11.6* 10.5*  HCT 38.1* 31.0*  MCV 83.7  --   PLT 140*  --    Basic Metabolic Panel: Recent Labs  Lab 02/27/23 2201 02/27/23 2248  NA 141 146*  K 4.3 3.5  CL 105 116*  CO2 26  --   GLUCOSE 83 55*  BUN 73* 71*  CREATININE 3.14* 2.30*  CALCIUM 9.5  --    GFR: Estimated Creatinine Clearance: 28.9 mL/min (A) (by C-G  formula based on SCr of 2.3 mg/dL (H)). Liver Function Tests: Recent Labs  Lab 02/27/23 2201  AST 46*  ALT 19  ALKPHOS 98  BILITOT 1.7*  PROT 7.4  ALBUMIN 3.0*   No results for input(s): "LIPASE", "AMYLASE" in the last 168 hours. No results for input(s): "AMMONIA" in the last 168 hours. Coagulation Profile: No results for input(s): "INR", "PROTIME" in the last 168 hours. Cardiac Enzymes: No results for input(s): "CKTOTAL", "CKMB", "CKMBINDEX", "TROPONINI" in the last 168 hours. BNP (last 3 results) No results for input(s): "PROBNP" in the last 8760 hours. HbA1C: No results for input(s): "HGBA1C" in the last 72 hours. CBG: Recent Labs  Lab 02/28/23 0204 02/28/23 0235  GLUCAP 30* 64*   Lipid Profile: No results for input(s): "CHOL", "HDL", "LDLCALC", "TRIG", "CHOLHDL", "LDLDIRECT" in the last 72 hours. Thyroid Function Tests: No results for input(s): "TSH", "T4TOTAL", "FREET4", "T3FREE", "THYROIDAB" in the last 72 hours. Anemia Panel: No results for input(s): "VITAMINB12", "FOLATE", "FERRITIN", "TIBC", "IRON", "RETICCTPCT" in the last 72 hours. Urine analysis:    Component Value Date/Time   COLORURINE YELLOW 09/25/2020 2131   APPEARANCEUR CLEAR 09/25/2020 2131   LABSPEC 1.022 09/25/2020 2131   PHURINE 5.0 09/25/2020 2131   GLUCOSEU >=500 (A) 09/25/2020 2131   GLUCOSEU NEGATIVE 07/20/2014 1055   HGBUR NEGATIVE 09/25/2020 2131   HGBUR negative 08/28/2008 1446   BILIRUBINUR NEGATIVE 09/25/2020 2131  BILIRUBINUR Neg 10/28/2014 0916   KETONESUR NEGATIVE 09/25/2020 2131   PROTEINUR NEGATIVE 09/25/2020 2131   UROBILINOGEN 0.2 10/28/2014 0916   UROBILINOGEN 0.2 07/20/2014 1055   NITRITE NEGATIVE 09/25/2020 2131   LEUKOCYTESUR SMALL (A) 09/25/2020 2131    Radiological Exams on Admission: DG Chest 2 View  Result Date: 02/27/2023 CLINICAL DATA:  Shortness of breath EXAM: CHEST - 2 VIEW COMPARISON:  Chest radiograph 12/29/2022 FINDINGS: Bilateral chest wall cardiac  device is are unchanged. The heart is enlarged, unchanged. The upper mediastinal contours are grossly stable Lung volumes are markedly low with bibasilar airspace opacities likely reflecting pleural effusions and adjacent airspace opacity which may reflect atelectasis or infection. There is probable mild pulmonary interstitial edema. Findings are worsened since the prior study. There is no definite pneumothorax. There is no acute osseous abnormality. IMPRESSION: Cardiomegaly with probable mild pulmonary interstitial edema and left larger than right pleural effusions with adjacent airspace opacity which may reflect atelectasis or infection. Findings are worsened since the prior study. Electronically Signed   By: Lesia Hausen M.D.   On: 02/27/2023 20:06    EKG: Independently reviewed. Paced rhythm, underlying atrial flutter  Assessment and Plan  Acute on chronic HFrEF Acute hypoxemic respiratory failure TEE 06/23/2022 showing EF 30 to 35%, LV function moderately decreased, severe concentric LVH, RV systolic function mildly reduced, severely dilated left atrium, moderately dilated right atrium, mild to moderate tricuspid regurgitation.  Patient presents with volume overload.  Cardiology note reviewed and weight up by 10 pounds since 02/14/2023. His torsemide was recently increased to 20 mg twice daily with no improvement of symptoms. Oxygen saturation in the mid 80s on room air, currently stable on 3 L [place].  BNP 1686.  Chest x-ray showing mild pulmonary interstitial edema and L>R pleural effusions with adjacent airspace opacity which may reflect atelectasis or infection.  Given no fever or leukocytosis, I think pneumonia is less likely.  COVID/influenza/RSV PCR negative. Will check procalcitonin level.  Patient was given IV Lasix 40 mg in the ED.  Repeat labs to check renal function given AKI on CKD stage IV.  Monitor intake and output, daily weights.  Low-sodium diet with fluid restriction.  Repeat echocardiogram  ordered.  Continue supplemental oxygen, wean as tolerated.  Consult cardiology and nephrology in the morning for further recommendations regarding IV diuresis.  AKI on CKD stage IV Likely cardiorenal due to acutely decompensated CHF.  Creatinine 3.1, was ranging between 2.5-2.8 in August 2024.  Patient was given IV Lasix in the ED.  Repeat labs ordered to check renal function.  Continue bicarb supplement.  Consult nephrology in the morning.  Elevated D-dimer Although elevated D-dimer could be related to advanced CKD, his right lower extremity does appear larger in size and slightly warm to touch concerning for possible DVT.  Since D-dimer is significantly elevated, PE needs to be ruled out.  CTA chest cannot be done due to advanced kidney disease.  Continue IV heparin at this time.  VQ scan and right lower extremity Doppler ordered.  Hypoglycemia Non-insulin-dependent type 2 diabetes Upon arrival to the floor from the ED, patient noted to be hypoglycemic with CBG 30 but asymptomatic.  He takes Januvia at home, not on insulin.  Last A1c 6.1 on 02/13/2023.  He was given orange juice, sandwich, and IV dextrose.  Repeat CBG 126.  Continue CBG checks every 4 hours for now.  Avoid insulin/oral hypoglycemic agents at this time.  Right shoulder pain Patient does not appear to be in  significant pain, resting comfortably.  X-ray ordered given recent falls.  Generalized weakness/recent falls Patient denies head injury or loss of consciousness.  No neurologic deficit on exam.  PT/OT eval, fall precautions.  Mild elevation of liver enzymes Likely congestive hepatopathy in the setting of decompensated CHF.  Patient is not endorsing any GI symptoms.  Abdominal exam benign.  Continue to monitor LFTs.  A-fib/flutter Hold Eliquis as patient is currently on IV heparin.  Cardiac amyloidosis (ATTR) Continue tafamidis.  Hypertension SBP currently in 150s.  Continue hydralazine.  Hyperlipidemia Continue  Crestor.  Gout Continue allopurinol.  DVT prophylaxis: IV heparin gtt Code Status: Full Code (discussed with the patient) Level of care: Progressive Care Unit Admission status: It is my clinical opinion that admission to INPATIENT is reasonable and necessary because of the expectation that this patient will require hospital care that crosses at least 2 midnights to treat this condition based on the medical complexity of the problems presented.  Given the aforementioned information, the predictability of an adverse outcome is felt to be significant.  John Giovanni MD [facility]  If 7PM-7AM, please contact night-coverage [email or web address]  02/28/2023, 2:48 AM

## 2023-02-28 NOTE — Assessment & Plan Note (Signed)
Follow up as outpatient.

## 2023-02-28 NOTE — Evaluation (Signed)
Physical Therapy Evaluation Patient Details Name: Corey [name] MRN: [id number] DOB: 12-20-1945 Today's Date: 02/28/2023  History of Present Illness  Pt is a 77 y.o. male presenting 10/15 from cardiologist office with distended abdomen, BLE swelling and weeping. CXR showing mild pulmonary interstitial edema and L>R pleural effusions with adjacent airspace opacity which may reflect atelectasis or infectionPMH significant for cardiac amyloidosis (ATTR), HFrEF, PEA arrest, pericardial effusion, hypertension, hyperlipidemia, CKD stage IV, DMII, CVA, A-fib/flutter s/p CTI ablation March 2024 on Eliquis, s/p CRT placement August 2024, gout, prostate cancer, seizures.  Clinical Impression  Patient presents with decreased mobility due to generalized weakness, decreased balance and recent falls at home.  Patient usually independent, though wife reports had to help him shower in the past week and had some falls.  Currently min A overall for hallway ambulation with RW and keeping O2 on at 2LPM throughout.  Feel he will benefit from skilled PT in the acute setting and follow up HHPT at d/c.      If plan is discharge home, recommend the following: A little help with walking and/or transfers;Assistance with cooking/housework;Assist for transportation;Help with stairs or ramp for entrance;A little help with bathing/dressing/bathroom   Can travel by private vehicle        Equipment Recommendations Other (comment) (shower chair)  Recommendations for Other Services       Functional Status Assessment Patient has had a recent decline in their functional status and demonstrates the ability to make significant improvements in function in a reasonable and predictable amount of time.     Precautions / Restrictions Precautions Precautions: Fall      Mobility  Bed Mobility Overal bed mobility: Needs Assistance Bed Mobility: Supine to Sit     Supine to sit: Min assist     General bed mobility comments:  for trunk to sit    Transfers Overall transfer level: Needs assistance Equipment used: Rolling walker (2 wheels) Transfers: Sit to/from Stand Sit to Stand: Min assist           General transfer comment: for power up from EOB    Ambulation/Gait Ambulation/Gait assistance: Contact guard assist Gait Distance (Feet): 90 Feet Assistive device: Rolling walker (2 wheels) Gait Pattern/deviations: Step-through pattern, Decreased stride length, Trunk flexed, Wide base of support       General Gait Details: slow pace, assist with turning walker in hallway and in room beside bed  Stairs            Wheelchair Mobility     Tilt Bed    Modified Rankin (Stroke Patients Only)       Balance Overall balance assessment: Needs assistance   Sitting balance-Leahy Scale: Good     Standing balance support: Bilateral upper extremity supported, Reliant on assistive device for balance Standing balance-Leahy Scale: Poor                               Pertinent Vitals/Pain Pain Assessment Pain Assessment: No/denies pain    Home Living Family/patient expects to be discharged to:: Private residence Living Arrangements: Spouse/significant other;Children (and son) Available Help at Discharge: Family;Available 24 hours/day Type of Home: House Home Access: Stairs to enter   [employer] of Steps: 1 step in back, 3 in front with rail   Home Layout: One level Home Equipment: Agricultural consultant (2 wheels)      Prior Function Prior Level of Function : Independent/Modified Independent  Mobility Comments: has had recent falls ADLs Comments: wife started helping with showers about 1 week     Extremity/Trunk Assessment   Upper Extremity Assessment Upper Extremity Assessment: Generalized weakness    Lower Extremity Assessment Lower Extremity Assessment: Generalized weakness (a little weaker on L)    Cervical / Trunk Assessment Cervical /  Trunk Assessment: Kyphotic  Communication   Communication Communication: No apparent difficulties  Cognition Arousal: Alert Behavior During Therapy: Flat affect Overall Cognitive Status: Within Functional Limits for tasks assessed                                 General Comments: not formally tested        General Comments General comments (skin integrity, edema, etc.): wife present and supportive, pt with CBG 103 on repeat testing per NT, other VSS on 2L O2 with ambulation SpO2 91%    Exercises     Assessment/Plan    PT Assessment Patient needs continued PT services  PT Problem List Decreased activity tolerance;Cardiopulmonary status limiting activity;Decreased mobility;Decreased strength;Decreased balance       PT Treatment Interventions DME instruction;Functional mobility training;Balance training;Patient/family education;Therapeutic activities;Gait training;Therapeutic exercise    PT Goals (Current goals can be found in the Care Plan section)  Acute Rehab PT Goals Patient Stated Goal: return to independent PT Goal Formulation: With patient/family Time For Goal Achievement: 03/06/2023 Potential to Achieve Goals: Good    Frequency Min 1X/week     Co-evaluation               AM-PAC PT "6 Clicks" Mobility  Outcome Measure Help needed turning from your back to your side while in a flat bed without using bedrails?: A Little Help needed moving from lying on your back to sitting on the side of a flat bed without using bedrails?: A Little Help needed moving to and from a bed to a chair (including a wheelchair)?: A Little Help needed standing up from a chair using your arms (e.g., wheelchair or bedside chair)?: A Little Help needed to walk in hospital room?: A Little Help needed climbing 3-5 steps with a railing? : Total 6 Click Score: 16    End of Session Equipment Utilized During Treatment: Gait belt;Oxygen Activity Tolerance: Patient tolerated  treatment well Patient left: in bed;with call bell/phone within reach;with bed alarm set (seated EOB to eat) Nurse Communication: Mobility status PT Visit Diagnosis: Other abnormalities of gait and mobility (R26.89);Muscle weakness (generalized) (M62.81);History of falling (Z91.81)    Time: 3244-0102 PT Time Calculation (min) (ACUTE ONLY): 32 min   Charges:   PT Evaluation $PT Eval Moderate Complexity: 1 Mod PT Treatments $Gait Training: 8-22 mins PT General Charges $$ ACUTE PT VISIT: 1 Visit         [name], PT Acute Rehabilitation Services [phone] 02/28/2023   Corey [name] 02/28/2023, 6:07 PM

## 2023-02-28 NOTE — Progress Notes (Signed)
Lower extremity venous duplex completed. Please see CV Procedures for preliminary results.  Shona Simpson, RVT 02/28/23 1:08 PM

## 2023-02-28 NOTE — Progress Notes (Signed)
Hypoglycemic Event  CBG: 30  Treatment: 4 oz juice/soda and D50 50 mL (25 gm) and a sandwich  Symptoms: None  Follow-up CBG: Time:0235 CBG Result: 64 0304- 126  Possible Reasons for Event: Inadequate meal intake  Comments/MD notified:MD notified    Janett Labella

## 2023-02-28 NOTE — Progress Notes (Signed)
OT Cancellation Note  Patient Details Name: Corey [name] MRN: [id number] DOB: 1946/01/18   Cancelled Treatment:    Reason Eval/Treat Not Completed: Medical issues which prohibited therapy. Low CBG will return as schedule allows and pt ready.   Myrla Halsted 02/28/2023, 4:39 PM

## 2023-02-28 NOTE — Progress Notes (Addendum)
Progress Note   Patient: Corey [name] GMW:[id number] DOB: 1945-11-03 DOA: 02/27/2023     0 DOS: the patient was seen and examined on 02/28/2023   Brief hospital course: Mr. Alberto was admitted to the hospital with the working diagnosis of heart failure exacerbation.   77 yo male with the past medical history of cardiac amyloidosis, heart failure, pericardial effusion (sp pericardial window), atrial fibrillation, CVA, CKD stage IV, T2DM and prior cardiac arrest (PEA), who presented with dyspnea. Reported lower extremity edema and dyspnea for about 2 weeks, associated with orthopnea, cough, and 10 lbs weight gain. On the day of hospitalization he was evaluated at the cardiology clinic, pre op evaluation for HD access, he was found volume overloaded and was referred to the ED for further evaluation. On his initial physical examination his blood pressure was 139/102, HR 71, RR 29 and 02 saturation 96%, lungs with bilateral rales with no wheezing, heart with S1 and S2 present and regular, abdomen soft and positive lower extremity edema.   Na 141, K 4,3 CL 105, bicarbonate 26, glucose 83 bun 73, cr 3,14 AST 46, ALT 19, total bilirubin 1,7  Wbc 2,3 hgb 11,6 plt 140  Sars covid 19 negative   Chest radiograph with hypoinflation, cardiomegaly, bilateral hilar vascular congestion, small left pleural effusion, pacemaker with one right atrial and one right ventricular lead. Defibrillator in place.   EKG 64 bpm, right axis deviation, normal intervals, ventricular paced rhythm with underlying atrial flutter, with no significant ST segment or T wave changes.    Assessment and Plan: * Acute on chronic systolic CHF (congestive heart failure) (HCC) 2023 Echocardiogram with reduced LV systolic function with EF 30 to 35%, global hypokinesis, severe LVH, RV systolic function preserved, RSVP 47,7 moderate RA and LA dilatation.   Documented urine output 300 cc Systolic blood pressure 140 to 160 mmHg.  Patient  continue volume overloaded.   Plan to increase furosemide to 80 mg IV q12 hrs Continue hydralazine for blood pressure control.  Check new echocardiogram.  Limited pharmacologic options due to acute worsening GFR.   Acute hypoxemic respiratory due to acute cardiogenic pulmonary edema.  Plan to continue diuresis with IV furosemide. Supplemental 02 per Lindsay to keep 02 saturation 92% or greater.   Elevated d dimer, continue anticoagulation with apixaban, (discontinue heparin to avoid volume overload),  Follow up with pulmonary V/Q scan and lower extremities doppler US.   Acute renal failure superimposed on stage 4 chronic kidney disease (HCC) Continue with volume overload.   Renal function today with serum cr at 3,12 with K at 4,2 and serum bicarbonate at 23. Na 139. BUN 72 Patient with no uremic symptoms.   Plan to continue loop diuretic with IV furosemide and follow up renal function in am.  If not adequate response to diuretics may need renal replacement therapy.   Paroxysmal atrial fibrillation (HCC) Patient with paced rhythm, on telemetry, tracing with artifact. Will continue telemetry monitoring.   Continue anticoagulation with apixaban.   Essential hypertension Hypertensive emergency on admission.  Blood pressure has improved, continue hydralazine and furosemide.   CAD (coronary artery disease) No chest pain, no acute coronary syndrome.   Non-insulin dependent type 2 diabetes mellitus (HCC) Hypoglycemia.   Plan to continue close glucose monitoring.  Will hold on insulin therapy for now, to prevent hypoglycemia.  Patient is tolerating po well.   Right shoulder pain Radiograph with no fractures.  Positive degenerative changes, acromiohumeral abutment consistent with chronic rotator cuff arthropathy and  likely degenerative rotator cuff tear.   Prostate cancer (HCC) Follow up as outpatient.         Subjective: Patient continue to have dyspnea and edema, no chest  pain, no nausea or vomiting.   Physical Exam: Vitals:   02/28/23 0141 02/28/23 0237 02/28/23 0357 02/28/23 0733  BP: (!) 167/102 (!) 155/58 (!) 142/95 (!) 160/99  Pulse: 71  65 69  Resp: (!) 29 (!) 22 18 18   Temp: (!) 96.9 F (36.1 C)  97.7 F (36.5 C) 97.6 F (36.4 C)  TempSrc: Axillary  Oral Oral  SpO2: 96%  96% 93%  Weight: 85.2 kg     Height: 5\' 6"  (1.676 m)      Neurology awake and alert ENT with mild pallor Cardiovascular with S1 and S2 present and regular with no gallops or rubs, positive systolic murmur at the apex Positive JVD  Respiratory with rales bilaterally with no wheezing or rhonchi Abdomen with no distention  Positive lower extremity edema +++ pitting up to the thighs  Data Reviewed:    Family Communication: no family at the bedside   Disposition: Status is: Inpatient Remains inpatient appropriate because: IV diuresis   Planned Discharge Destination: Home     Author: Coralie Keens, MD 02/28/2023 9:19 AM  For on call review [email or web address].

## 2023-02-28 NOTE — Progress Notes (Signed)
PT Cancellation Note  Patient Details Name: Corey [name] MRN: [id number] DOB: 07-13-1945   Cancelled Treatment:    Reason Eval/Treat Not Completed: Medical issues which prohibited therapy; noted CBG 53, will return later as able.   [name] 02/28/2023, 4:02 PM [name], PT Acute Rehabilitation Services Office:[phone] 02/28/2023

## 2023-02-28 NOTE — Progress Notes (Signed)
DM type II Hypoglycemic episode Patient's nurse requesting for diet order. Patient found to have low blood glucose 30.  Checking blood glucose every 4 hours and starting D50 as needed for hypoglycemia less than 70 - Starting carb modified heart healthy diet.  Tereasa Coop, MD [facility] 02/28/2023, 2:22 AM

## 2023-02-28 NOTE — Assessment & Plan Note (Addendum)
Hypoglycemia.   Fasting glucose this am 73 mg/dl.  Continue to hold on insulin therapy and continue capillary glucose monitoring.  Hypoglycemia protocol.    Hand and feet diabetic neuropathy, with pain that limits mobility Continue with gabapentin.

## 2023-02-28 NOTE — Assessment & Plan Note (Addendum)
Patient with paced rhythm, on telemetry, pacing with artifact, due to biventricular pacing.  Underlying atrial fibrillation.  12/28/22 CRT pacemaker implantation, BiV pacing.  Will continue telemetry monitoring.   Continue anticoagulation with apixaban.

## 2023-03-01 DIAGNOSIS — I10 Essential (primary) hypertension: Secondary | ICD-10-CM | POA: Diagnosis not present

## 2023-03-01 DIAGNOSIS — I5023 Acute on chronic systolic (congestive) heart failure: Secondary | ICD-10-CM | POA: Diagnosis not present

## 2023-03-01 DIAGNOSIS — I480 Paroxysmal atrial fibrillation: Secondary | ICD-10-CM | POA: Diagnosis not present

## 2023-03-01 DIAGNOSIS — N179 Acute kidney failure, unspecified: Secondary | ICD-10-CM | POA: Diagnosis not present

## 2023-03-01 LAB — BASIC METABOLIC PANEL
Anion gap: 10 (ref 5–15)
BUN: 68 mg/dL — ABNORMAL HIGH (ref 8–23)
CO2: 26 mmol/L (ref 22–32)
Calcium: 9 mg/dL (ref 8.9–10.3)
Chloride: 102 mmol/L (ref 98–111)
Creatinine, Ser: 3.19 mg/dL — ABNORMAL HIGH (ref 0.61–1.24)
GFR, Estimated: 20 mL/min — ABNORMAL LOW (ref >60)
Glucose, Bld: 100 mg/dL — ABNORMAL HIGH (ref 70–99)
Potassium: 4.6 mmol/L (ref 3.5–5.1)
Sodium: 138 mmol/L (ref 135–145)

## 2023-03-01 LAB — GLUCOSE, CAPILLARY
Glucose-Capillary: 103 mg/dL — ABNORMAL HIGH (ref 70–99)
Glucose-Capillary: 122 mg/dL — ABNORMAL HIGH (ref 70–99)
Glucose-Capillary: 149 mg/dL — ABNORMAL HIGH (ref 70–99)
Glucose-Capillary: 79 mg/dL (ref 70–99)
Glucose-Capillary: 97 mg/dL (ref 70–99)
Glucose-Capillary: 97 mg/dL (ref 70–99)

## 2023-03-01 LAB — MAGNESIUM: Magnesium: 2.7 mg/dL — ABNORMAL HIGH (ref 1.7–2.4)

## 2023-03-01 MED ORDER — FUROSEMIDE 10 MG/ML IJ SOLN
80.0000 mg | Freq: Three times a day (TID) | INTRAMUSCULAR | Status: DC
  Administered 2023-03-01 – 2023-03-05 (×12): 80 mg via INTRAVENOUS
  Filled 2023-03-01 (×12): qty 8

## 2023-03-01 MED ORDER — INFLUENZA VAC A&B SURF ANT ADJ 0.5 ML IM SUSY
0.5000 mL | PREFILLED_SYRINGE | INTRAMUSCULAR | Status: DC
  Filled 2023-03-01: qty 0.5

## 2023-03-01 MED ORDER — ENSURE ENLIVE PO LIQD
237.0000 mL | Freq: Two times a day (BID) | ORAL | Status: DC
  Administered 2023-03-01 – 2023-03-13 (×21): 237 mL via ORAL

## 2023-03-01 MED ORDER — ALBUTEROL SULFATE (2.5 MG/3ML) 0.083% IN NEBU
INHALATION_SOLUTION | RESPIRATORY_TRACT | Status: AC
  Filled 2023-03-01: qty 3

## 2023-03-01 MED ORDER — ALBUTEROL SULFATE (2.5 MG/3ML) 0.083% IN NEBU
2.5000 mg | INHALATION_SOLUTION | RESPIRATORY_TRACT | Status: DC | PRN
  Administered 2023-03-01 (×2): 2.5 mg via RESPIRATORY_TRACT
  Filled 2023-03-01: qty 3

## 2023-03-01 MED ORDER — METOLAZONE 2.5 MG PO TABS
2.5000 mg | ORAL_TABLET | Freq: Once | ORAL | Status: AC
  Administered 2023-03-01: 2.5 mg via ORAL
  Filled 2023-03-01: qty 1

## 2023-03-01 NOTE — Progress Notes (Signed)
Progress Note   Patient: Corey [name] NWG:[id number] DOB: 03/20/1946 DOA: 02/27/2023     1 DOS: the patient was seen and examined on 03/01/2023   Brief hospital course: Mr. Assad was admitted to the hospital with the working diagnosis of heart failure exacerbation.   77 yo male with the past medical history of cardiac amyloidosis, heart failure, pericardial effusion (sp pericardial window), atrial fibrillation, CVA, CKD stage IV, T2DM and prior cardiac arrest (PEA), who presented with dyspnea. Reported lower extremity edema and dyspnea for about 2 weeks, associated with orthopnea, cough, and 10 lbs weight gain. On the day of hospitalization he was evaluated at the cardiology clinic, pre op evaluation for HD access, he was found volume overloaded and was referred to the ED for further evaluation. On his initial physical examination his blood pressure was 139/102, HR 71, RR 29 and 02 saturation 96%, lungs with bilateral rales with no wheezing, heart with S1 and S2 present and regular, abdomen soft and positive lower extremity edema.   Na 141, K 4,3 CL 105, bicarbonate 26, glucose 83 bun 73, cr 3,14 AST 46, ALT 19, total bilirubin 1,7  Wbc 2,3 hgb 11,6 plt 140  Sars covid 19 negative   Chest radiograph with hypoinflation, cardiomegaly, bilateral hilar vascular congestion, small left pleural effusion, pacemaker with one right atrial and one right ventricular lead. Defibrillator in place.   EKG 64 bpm, right axis deviation, normal intervals, ventricular paced rhythm with underlying atrial flutter, with no significant ST segment or T wave changes.   10/17 patient placed on IV furosemide, continue volume overloaded.   Assessment and Plan: * Acute on chronic systolic CHF (congestive heart failure) (HCC) Echocardiogram reduced LV systolic function with EF 20 to 25%, global hypokinesis, severe LVH, RV with severe reduction in systolic function, RV mild enlargement, RVSP 48,1 LA with mild dilatation, RA  with moderate dilatation,   Documented urine output 1,200 cc Systolic blood pressure 114 to 106 mmHg.  Patient continue volume overloaded.   Plan to increase furosemide to 80 mg IV q8 hrs Add one dose of metolazone 2.5 mg  Hold on hydralazine to avoid hypotension.  Limited pharmacologic options due to acute worsening GFR.   Acute hypoxemic respiratory due to acute cardiogenic pulmonary edema.  Plan to continue diuresis with IV furosemide. 02 saturation is 95% on 2 L/min per Ila.   V/Q scan with no evidence of pulmonary embolism.   Cardiac amyloidosis, resume tafamidis.   Acute renal failure superimposed on stage 4 chronic kidney disease (HCC) Continue with volume overload.   Renal function with serum cr at 3,19 with K at 4,6 and serum bicarbonate at 26. Na 138 and BUN 68,  Continue diuretic therapy with furosemide and ill add one dose of metolazone. Follow up renal function in am.  Avoid hypotension.   Paroxysmal atrial fibrillation (HCC) Patient with paced rhythm, on telemetry, tracing with artifact. Underlying atrial fibrillation.  12/28/22 CRT pacemaker implantation, BiV pacing.  Will continue telemetry monitoring.   Continue anticoagulation with apixaban.   Essential hypertension Blood pressure is improving, will discontinue hydralazine for no to avoid further hypotension.   CAD (coronary artery disease) No chest pain, no acute coronary syndrome.   Non-insulin dependent type 2 diabetes mellitus (HCC) Hypoglycemia.   Plan to continue close glucose monitoring.  Will hold on insulin therapy for now, to prevent hypoglycemia.  Hypoglycemia protocol.   Right shoulder pain Radiograph with no fractures.  Positive degenerative changes, acromiohumeral abutment consistent with chronic  rotator cuff arthropathy and likely degenerative rotator cuff tear.   Prostate cancer (HCC) Follow up as outpatient.        Subjective: Patient with persistent dyspnea and  congestion, no chest pain, yesterday he had hypoglycemia.   Physical Exam: Vitals:   02/28/23 1945 02/28/23 2318 03/01/23 0358 03/01/23 0715  BP: (!) 154/88 (!) 143/100 114/77 106/83  Pulse: 61 65 73 60  Resp: (!) 22 20 (!) 24 18  Temp: 97.9 F (36.6 C) (!) 97.5 F (36.4 C) 98.8 F (37.1 C) 98.6 F (37 C)  TempSrc: Axillary Oral Axillary Oral  SpO2: 95% 97% 95% 92%  Weight:   86.5 kg   Height:       Neurology awake and alert ENT with mild pallor Cardiovascular with S1 and S2 present and regular with no gallops, or rubs, no murmurs.  Positive JVD Positive lower extremity edema +  Respiratory with rales bilaterally with no wheezing or rhonchi Abdomen with no distention  Data Reviewed:    Family Communication: no family at the bedside   Disposition: Status is: Inpatient Remains inpatient appropriate because: heart failure and renal failure.   Planned Discharge Destination: Home     Author: Coralie Keens, MD 03/01/2023 10:14 AM  For on call review [email or web address].

## 2023-03-01 NOTE — Progress Notes (Signed)
Patient midnight CBG 71. Patient given a cup of juice.

## 2023-03-01 NOTE — Plan of Care (Signed)
  Problem: Education: Goal: Understanding of CV disease, CV risk reduction, and recovery process will improve Outcome: Progressing Goal: Individualized Educational Video(s) Outcome: Progressing   Problem: Activity: Goal: Ability to return to baseline activity level will improve Outcome: Progressing   Problem: Cardiovascular: Goal: Ability to achieve and maintain adequate cardiovascular perfusion will improve Outcome: Progressing   Problem: Cardiovascular: Goal: Vascular access site(s) Level 0-1 will be maintained Outcome: Progressing   Problem: Health Behavior/Discharge Planning: Goal: Ability to safely manage health-related needs after discharge will improve Outcome: Progressing   Problem: Education: Goal: Knowledge of cardiac device and self-care will improve Outcome: Progressing Goal: Ability to safely manage health related needs after discharge will improve Outcome: Progressing Goal: Individualized Educational Video(s) Outcome: Progressing   Problem: Cardiac: Goal: Ability to achieve and maintain adequate cardiopulmonary perfusion will improve Outcome: Progressing   Problem: Education: Goal: Understanding of disease, treatment, and recovery process will improve Outcome: Progressing   Problem: Activity: Goal: Ability to return to baseline activity level will improve Outcome: Progressing   Problem: Cardiac: Goal: Ability to maintain adequate cardiovascular perfusion will improve Outcome: Progressing Goal: Vascular access site(s) Level 0-1 will be maintained Outcome: Progressing   Problem: Health Behavior/ Discharge Planning: Goal: Ability to safely manage health related needs after discharge Outcome: Progressing   Problem: Education: Goal: Knowledge of General Education information will improve Description: Including pain rating scale, medication(s)/side effects and non-pharmacologic comfort measures Outcome: Progressing   Problem: Health Behavior/Discharge  Planning: Goal: Ability to manage health-related needs will improve Outcome: Progressing   Problem: Clinical Measurements: Goal: Ability to maintain clinical measurements within normal limits will improve Outcome: Progressing Goal: Will remain free from infection Outcome: Progressing Goal: Diagnostic test results will improve Outcome: Progressing Goal: Respiratory complications will improve Outcome: Progressing Goal: Cardiovascular complication will be avoided Outcome: Progressing   Problem: Activity: Goal: Risk for activity intolerance will decrease Outcome: Progressing   Problem: Nutrition: Goal: Adequate nutrition will be maintained Outcome: Progressing   Problem: Coping: Goal: Level of anxiety will decrease Outcome: Progressing   Problem: Elimination: Goal: Will not experience complications related to bowel motility Outcome: Progressing Goal: Will not experience complications related to urinary retention Outcome: Progressing   Problem: Pain Managment: Goal: General experience of comfort will improve Outcome: Progressing   Problem: Safety: Goal: Ability to remain free from injury will improve Outcome: Progressing   Problem: Skin Integrity: Goal: Risk for impaired skin integrity will decrease Outcome: Progressing   Problem: Education: Goal: Ability to demonstrate management of disease process will improve Outcome: Progressing Goal: Ability to verbalize understanding of medication therapies will improve Outcome: Progressing Goal: Individualized Educational Video(s) Outcome: Progressing   Problem: Activity: Goal: Capacity to carry out activities will improve Outcome: Progressing   Problem: Cardiac: Goal: Ability to achieve and maintain adequate cardiopulmonary perfusion will improve Outcome: Progressing

## 2023-03-01 NOTE — Progress Notes (Signed)
Mobility Specialist Progress Note:   03/01/23 1000  Oxygen Therapy  O2 Device HFNC  O2 Flow Rate (L/min) 2 L/min  Mobility  Activity Ambulated with assistance in hallway  Level of Assistance Contact guard assist, steadying assist  Assistive Device Front wheel walker  Distance Ambulated (ft) 100 ft  Activity Response Tolerated well  Mobility Referral Yes  $Mobility charge 1 Mobility  Mobility Specialist Start Time (ACUTE ONLY) 1010  Mobility Specialist Stop Time (ACUTE ONLY) 1037  Mobility Specialist Time Calculation (min) (ACUTE ONLY) 27 min    Pt received on EOB, agreeable to mobility. Asymptomatic throughout w/ no complaints. Pt left in bed with call bell and all needs met.  D'Vante Earlene Plater Mobility Specialist Please contact via Special educational needs teacher or Rehab office at [phone]

## 2023-03-01 NOTE — Evaluation (Signed)
Occupational Therapy Evaluation Patient Details Name: Corey [name] MRN: [id number] DOB: 1945/12/26 Today's Date: 03/01/2023   History of Present Illness Pt is a 77 y.o. male presenting 10/15 from cardiologist office with distended abdomen, BLE swelling and weeping. CXR showing mild pulmonary interstitial edema and L>R pleural effusions with adjacent airspace opacity which may reflect atelectasis or infection. Pt also with R shoulder pain post recent fall at home with radiograph with no fractures but positive for degenerative changes and consistent with chronic rotator cuff arthropathy and likely degenerative rotator cuff tear. PMH significant for cardiac amyloidosis (ATTR), HFrEF, PEA arrest, pericardial effusion, hypertension, hyperlipidemia, CKD stage IV, DMII, CVA, A-fib/flutter s/p CTI ablation March 2024 on Eliquis, s/p CRT placement August 2024, gout, prostate cancer, seizures.   Clinical Impression   At baseline pt is Independent with ADLs but reports needing assist of wife for bathing over the past week. Pt reports two recent Pt now presents with decreased activity tolerance, decreased balance during functional tasks, generalized B UE weakness worse on Right, increased pain in Right shoulder with AROM/PROM, and decreased safety and  independence with functional tasks. Pt also limited by current cardiopulmonary status with an episode of orthostatic hypotension and c/o dizziness this session with BP in sitting 148/53 and 99/81 in standing. Pt's HR between mid-60s to 120 bpm with AFib rhythm throughout session, RR between 18 and 30, and O2 sat >/ 94% on 3L continuous O2 though nasal cannula. Pt currently demonstrates ability to complete UB ADLs with Mod I to Min assist, LB ADLs with Mod to Max assist, and sit<-> stand transfers with Contact guard to Min assist. Pt will benefit from acute skilled OT services to address deficits outlined below of and increase safety and independence with functional  tasks. Post acute discharge, pt will benefit from continued skilled OT services in the home to maximize rehab potential.       If plan is discharge home, recommend the following: A little help with walking and/or transfers;A lot of help with bathing/dressing/bathroom;Assistance with cooking/housework;Assist for transportation;Help with stairs or ramp for entrance    Functional Status Assessment  Patient has had a recent decline in their functional status and demonstrates the ability to make significant improvements in function in a reasonable and predictable amount of time.  Equipment Recommendations  BSC/3in1;Tub/shower seat    Recommendations for Other Services       Precautions / Restrictions Precautions Precautions: Fall Restrictions Weight Bearing Restrictions: No      Mobility Bed Mobility Overal bed mobility: Needs Assistance             General bed mobility comments: Pt sitting at EOB at beginning and end of session.    Transfers Overall transfer level: Needs assistance Equipment used: Rolling walker (2 wheels) Transfers: Sit to/from Stand Sit to Stand: Contact guard assist, Min assist           General transfer comment: Min assist to power up and CGA to maintain balance in standing      Balance Overall balance assessment: Needs assistance Sitting-balance support: Single extremity supported, Feet supported, No upper extremity supported Sitting balance-Leahy Scale: Good     Standing balance support: Bilateral upper extremity supported, During functional activity, Reliant on assistive device for balance Standing balance-Leahy Scale: Poor                             ADL either performed or assessed with clinical judgement  ADL Overall ADL's : Needs assistance/impaired Eating/Feeding: Modified independent;Sitting   Grooming: Contact guard assist;Sitting   Upper Body Bathing: Minimal assistance;Sitting (with increased time)   Lower  Body Bathing: Moderate assistance;Sitting/lateral leans;Sit to/from stand (with increased time)   Upper Body Dressing : Minimal assistance;Sitting   Lower Body Dressing: Moderate assistance;Sitting/lateral leans;Sit to/from stand (with increased time)     Toilet Transfer Details (indicate cue type and reason): deferred this session for pt/therapist safety due to pt presenting with episode of orthostatic hypotension and dizziness in stanidng Toileting- Clothing Manipulation and Hygiene: Maximal assistance;Sitting/lateral lean;Sit to/from stand Toileting - Clothing Manipulation Details (indicate cue type and reason): in simulated task at EOB     Functional mobility during ADLs:  (deferred this session for pt/therapist safety due to pt presenting with episode of orthostatic hypotension and dizziness in stanidng) General ADL Comments: Pt with decreased activity tolerance, pain in R shoulder, and dizziness in standing affecting funcitonal level this session.     Vision Baseline Vision/History: 1 Wears glasses;4 Cataracts Ability to See in Adequate Light: 1 Impaired Patient Visual Report: No change from baseline       Perception         Praxis         Pertinent Vitals/Pain Pain Assessment Pain Assessment: Faces Faces Pain Scale: Hurts even more (2 ar rest; 6 with movement) Pain Location: R shoulder Pain Descriptors / Indicators: Aching, Discomfort, Grimacing, Guarding, Sore Pain Intervention(s): Limited activity within patient's tolerance, Monitored during session, Repositioned     Extremity/Trunk Assessment Upper Extremity Assessment Upper Extremity Assessment: Right hand dominant;RUE deficits/detail;LUE deficits/detail;Generalized weakness RUE Deficits / Details: Generalized weakness; Overall ROM WFL but with pt presenting with increased pain with shoulder AROM/AROM with with radiograph this admission revealing no fractures but positive for degenerative changes and consistent  with chronic rotator cuff arthropathy and likely degenerative rotator cuff tear. Mildly edematous hands and forearm noted. Mildly decreased fine motor coordinaiton at baseline. RUE Coordination: decreased fine motor (mild) LUE Deficits / Details: Generalized weaknsess. Impaired range of motion in digits III through V of hand at baseline; decreased fine motor coordination; mildly edemotous hand and forearm noted LUE Coordination: decreased fine motor   Lower Extremity Assessment Lower Extremity Assessment: Defer to PT evaluation   Cervical / Trunk Assessment Cervical / Trunk Assessment: Kyphotic   Communication Communication Communication: No apparent difficulties   Cognition Arousal: Alert Behavior During Therapy: Flat affect Overall Cognitive Status: Within Functional Limits for tasks assessed                                 General Comments: Not formally tested. AAOx4 with cognition WFL for tasks performed. Able to follow 1 and 2 step directions consistently.     General Comments  Pt with episode of orthostatic hypotension this session. In sitting pt's BP was 148/53 and in standing was 99/81 with pt reporting significant dizziness in standing. Pt's HR between mid-60s to 120 bpm with AFib rhythm throughout session, RR between 18 and 30, and O2 sat >/ 94% on 3L continuous O2 though nasal cannula.    Exercises     Shoulder Instructions      Home Living Family/patient expects to be discharged to:: Private residence Living Arrangements: Spouse/significant other;Children (son) Available Help at Discharge: Family;Available 24 hours/day Type of Home: House Home Access: Stairs to enter [employer] of Steps: 1 step in back, 3 in front with rail Entrance  Stairs-Rails: Right Home Layout: One level     Bathroom Shower/Tub: Chief Strategy Officer: Standard Bathroom Accessibility: Yes   Home Equipment: Agricultural consultant (2 wheels)          Prior  Functioning/Environment Prior Level of Function : Independent/Modified Independent             Mobility Comments: Pt reports he typically performs funcitonal mobility Independent without an AD but reports two recent falls. ADLs Comments: Pt typically Independent with ADLs but wife started helping with showers about 1 week ago.        OT Problem List: Decreased strength;Decreased activity tolerance;Impaired balance (sitting and/or standing);Decreased coordination;Decreased knowledge of use of DME or AE;Cardiopulmonary status limiting activity;Pain;Impaired UE functional use      OT Treatment/Interventions: Self-care/ADL training;Therapeutic exercise;Energy conservation;DME and/or AE instruction;Therapeutic activities;Patient/family education;Balance training    OT Goals(Current goals can be found in the care plan section) Acute Rehab OT Goals Patient Stated Goal: to return home and have less pain in his Right shoulder OT Goal Formulation: With patient Time For Goal Achievement: 03/15/23 Potential to Achieve Goals: Good ADL Goals Pt Will Perform Upper Body Bathing: with supervision;sitting Pt Will Perform Lower Body Bathing: with contact guard assist;sit to/from stand;sitting/lateral leans Pt Will Perform Upper Body Dressing: with supervision;sitting Pt Will Perform Lower Body Dressing: with supervision;sit to/from stand;sitting/lateral leans Pt Will Transfer to Toilet: with supervision;ambulating;bedside commode (with least restrictive AD) Pt Will Perform Toileting - Clothing Manipulation and hygiene: with contact guard assist;sitting/lateral leans;sit to/from stand Pt/caregiver will Perform Home Exercise Program: Both right and left upper extremity;With theraband;Increased strength;With Supervision;With written HEP provided (Left with theraband; Right with AROM progressing to theraband; increased activity tolerance)  OT Frequency: Min 1X/week    Co-evaluation               AM-PAC OT "6 Clicks" Daily Activity     Outcome Measure Help from another person eating meals?: None Help from another person taking care of personal grooming?: A Little Help from another person toileting, which includes using toliet, bedpan, or urinal?: A Lot Help from another person bathing (including washing, rinsing, drying)?: A Lot Help from another person to put on and taking off regular upper body clothing?: A Little Help from another person to put on and taking off regular lower body clothing?: A Lot 6 Click Score: 16   End of Session Equipment Utilized During Treatment: Rolling walker (2 wheels);Oxygen Nurse Communication: Mobility status;Other (comment) (Pt sitting EOB. Pt with episode of orthostatic hypotension.)  Activity Tolerance: Patient tolerated treatment well;Treatment limited secondary to medical complications (Comment) (Pt limited by episode of orthostatic hypotension) Patient left: in bed;with call bell/phone within reach;with bed alarm set  OT Visit Diagnosis: Other abnormalities of gait and mobility (R26.89);History of falling (Z91.81);Repeated falls (R29.6);Muscle weakness (generalized) (M62.81);Pain                Time: 6433-2951 OT Time Calculation (min): 24 min Charges:  OT General Charges $OT Visit: 1 Visit OT Evaluation $OT Eval Low Complexity: 1 Low  Brianna Bennett "Kyle" M., OTR/L, MA Acute Rehab [phone]   Lendon Colonel 03/01/2023, 11:36 AM

## 2023-03-02 ENCOUNTER — Encounter (HOSPITAL_COMMUNITY): Admission: RE | Payer: Self-pay | Source: Home / Self Care

## 2023-03-02 ENCOUNTER — Ambulatory Visit (HOSPITAL_COMMUNITY): Admission: RE | Admit: 2023-03-02 | Payer: Medicare HMO | Source: Home / Self Care | Admitting: Vascular Surgery

## 2023-03-02 DIAGNOSIS — N179 Acute kidney failure, unspecified: Secondary | ICD-10-CM | POA: Diagnosis not present

## 2023-03-02 DIAGNOSIS — I10 Essential (primary) hypertension: Secondary | ICD-10-CM | POA: Diagnosis not present

## 2023-03-02 DIAGNOSIS — I480 Paroxysmal atrial fibrillation: Secondary | ICD-10-CM | POA: Diagnosis not present

## 2023-03-02 DIAGNOSIS — I5023 Acute on chronic systolic (congestive) heart failure: Secondary | ICD-10-CM | POA: Diagnosis not present

## 2023-03-02 LAB — BASIC METABOLIC PANEL
Anion gap: 13 (ref 5–15)
BUN: 69 mg/dL — ABNORMAL HIGH (ref 8–23)
CO2: 25 mmol/L (ref 22–32)
Calcium: 9.4 mg/dL (ref 8.9–10.3)
Chloride: 100 mmol/L (ref 98–111)
Creatinine, Ser: 3.14 mg/dL — ABNORMAL HIGH (ref 0.61–1.24)
GFR, Estimated: 20 mL/min — ABNORMAL LOW (ref >60)
Glucose, Bld: 135 mg/dL — ABNORMAL HIGH (ref 70–99)
Potassium: 4.7 mmol/L (ref 3.5–5.1)
Sodium: 138 mmol/L (ref 135–145)

## 2023-03-02 LAB — GLUCOSE, CAPILLARY
Glucose-Capillary: 106 mg/dL — ABNORMAL HIGH (ref 70–99)
Glucose-Capillary: 106 mg/dL — ABNORMAL HIGH (ref 70–99)
Glucose-Capillary: 139 mg/dL — ABNORMAL HIGH (ref 70–99)
Glucose-Capillary: 141 mg/dL — ABNORMAL HIGH (ref 70–99)
Glucose-Capillary: 142 mg/dL — ABNORMAL HIGH (ref 70–99)
Glucose-Capillary: 85 mg/dL (ref 70–99)

## 2023-03-02 LAB — MAGNESIUM: Magnesium: 2.7 mg/dL — ABNORMAL HIGH (ref 1.7–2.4)

## 2023-03-02 SURGERY — ARTERIOVENOUS (AV) FISTULA CREATION
Anesthesia: Choice | Laterality: Left

## 2023-03-02 MED ORDER — POLYETHYLENE GLYCOL 3350 17 G PO PACK
17.0000 g | PACK | Freq: Every day | ORAL | Status: DC
  Administered 2023-03-02 – 2023-03-09 (×6): 17 g via ORAL
  Filled 2023-03-02 (×9): qty 1

## 2023-03-02 MED ORDER — TAFAMIDIS 61 MG PO CAPS
61.0000 mg | ORAL_CAPSULE | Freq: Every day | ORAL | Status: DC
  Administered 2023-03-02 – 2023-03-13 (×12): 61 mg via ORAL
  Filled 2023-03-02 (×12): qty 1

## 2023-03-02 NOTE — Progress Notes (Signed)
Mobility Specialist Progress Note:    03/02/23 1300  Mobility  Activity Refused mobility  Mobility Specialist Start Time (ACUTE ONLY) 1325   Pt refused mobility d/t BLE pain. Will f/u as able.    Corey [name] Mobility Specialist Please contact via Special educational needs teacher or Rehab office at [phone]

## 2023-03-02 NOTE — Plan of Care (Signed)
[facility] Tonsillectomy/Adenoidectomy, Postoperative PEDS care plan entered in error.

## 2023-03-02 NOTE — Progress Notes (Signed)
Physical Therapy Treatment Patient Details Name: Corey [name] MRN: [id number] DOB: 14-Jun-1945 Today's Date: 03/02/2023   History of Present Illness Pt is a 77 y.o. male presenting 10/15 from cardiologist office with distended abdomen, BLE swelling and weeping. CXR showing mild pulmonary interstitial edema and L>R pleural effusions with adjacent airspace opacity which may reflect atelectasis or infection. Pt also with R shoulder pain post recent fall at home with radiograph with no fractures but positive for degenerative changes and consistent with chronic rotator cuff arthropathy and likely degenerative rotator cuff tear. PMH significant for cardiac amyloidosis (ATTR), HFrEF, PEA arrest, pericardial effusion, hypertension, hyperlipidemia, CKD stage IV, DMII, CVA, A-fib/flutter s/p CTI ablation March 2024 on Eliquis, s/p CRT placement August 2024, gout, prostate cancer, seizures.    PT Comments  Pt admitted with above diagnosis. Pt was only able to stand and pivot to recliner with RW with min assist and cues with +2 for safety as pt c/o pain left foot.  Pt questionsing if gout is in left foot. Nurse made aware. Pt would not agree to walk due to pain.  Exercises and left pt in recliner.   Pt currently with functional limitations due to the deficits listed below (see PT Problem List). Pt will benefit from acute skilled PT to increase their independence and safety with mobility to allow discharge.       If plan is discharge home, recommend the following: A little help with walking and/or transfers;Assistance with cooking/housework;Assist for transportation;Help with stairs or ramp for entrance;A little help with bathing/dressing/bathroom   Can travel by private vehicle        Equipment Recommendations  Other (comment) (shower chair)    Recommendations for Other Services       Precautions / Restrictions Precautions Precautions: Fall Restrictions Weight Bearing Restrictions: No     Mobility   Bed Mobility Overal bed mobility: Needs Assistance Bed Mobility: Supine to Sit     Supine to sit: Min assist     General bed mobility comments: Needed assist to come to eOB    Transfers Overall transfer level: Needs assistance Equipment used: Rolling walker (2 wheels) Transfers: Sit to/from Stand, Bed to chair/wheelchair/BSC Sit to Stand: Min assist, +2 safety/equipment, From elevated surface Stand pivot transfers: Min assist, +2 safety/equipment         General transfer comment: Min assist to power up and  to maintain balance in standing as pt with left foot pain today needing incr assist and +2 for safety. Could not walk due to left foot pain.  Nurse came in room and was made aware. Pt concerned it may be gout pain. Nurse checked and he did get his gout meds.    Ambulation/Gait                   Stairs             Wheelchair Mobility     Tilt Bed    Modified Rankin (Stroke Patients Only)       Balance Overall balance assessment: Needs assistance Sitting-balance support: Single extremity supported, Feet supported, No upper extremity supported Sitting balance-Leahy Scale: Good     Standing balance support: Bilateral upper extremity supported, During functional activity, Reliant on assistive device for balance Standing balance-Leahy Scale: Poor Standing balance comment: relies on RW and external support                            Cognition  Arousal: Alert Behavior During Therapy: Flat affect Overall Cognitive Status: Within Functional Limits for tasks assessed                                 General Comments: Not formally tested. AAOx4 with cognition WFL for tasks performed. Able to follow 1 and 2 step directions consistently.        Exercises General Exercises - Lower Extremity Ankle Circles/Pumps: AROM, Both, 10 reps, Supine Long Arc Quad: AROM, Both, 10 reps, Seated Hip Flexion/Marching: AROM, Both, 10 reps,  Seated    General Comments General comments (skin integrity, edema, etc.): 64-70 bpm, supine 128/85, sitting 141/86, standing 127/80; Pt on 1LO2 and PT removed it for activity as he was >90%.  Desat to 87% on RA with activity therefore replaced O2 at 1L prior to departure.      Pertinent Vitals/Pain Pain Assessment Pain Assessment: Faces Faces Pain Scale: Hurts whole lot Pain Location: left foot Pain Descriptors / Indicators: Aching, Grimacing, Guarding, Sore Pain Intervention(s): Limited activity within patient's tolerance, Monitored during session, Repositioned    Home Living                          Prior Function            PT Goals (current goals can now be found in the care plan section) Acute Rehab PT Goals Patient Stated Goal: return to independent Progress towards PT goals: Not progressing toward goals - comment (left foot pain today limiting pt)    Frequency    Min 1X/week      PT Plan      Co-evaluation              AM-PAC PT "6 Clicks" Mobility   Outcome Measure  Help needed turning from your back to your side while in a flat bed without using bedrails?: A Little Help needed moving from lying on your back to sitting on the side of a flat bed without using bedrails?: A Little Help needed moving to and from a bed to a chair (including a wheelchair)?: Total Help needed standing up from a chair using your arms (e.g., wheelchair or bedside chair)?: Total Help needed to walk in hospital room?: Total Help needed climbing 3-5 steps with a railing? : Total 6 Click Score: 10    End of Session Equipment Utilized During Treatment: Gait belt;Oxygen Activity Tolerance: Patient limited by pain;Patient limited by fatigue Patient left: with call bell/phone within reach;in chair;with chair alarm set Nurse Communication: Mobility status PT Visit Diagnosis: Other abnormalities of gait and mobility (R26.89);Muscle weakness (generalized) (M62.81);History of  falling (Z91.81)     Time: 6962-9528 PT Time Calculation (min) (ACUTE ONLY): 13 min  Charges:    $Therapeutic Activity: 8-22 mins PT General Charges $$ ACUTE PT VISIT: 1 Visit                     Corey [name] M,PT Acute Rehab Services [phone]    Corey [name] 03/02/2023, 2:06 PM

## 2023-03-02 NOTE — Progress Notes (Signed)
Progress Note   Patient: Corey [name] ZOX:[id number] DOB: 08-Sep-1945 DOA: 02/27/2023     2 DOS: the patient was seen and examined on 03/02/2023   Brief hospital course: [name] was admitted to the hospital with the working diagnosis of heart failure exacerbation.   77 yo male with the past medical history of cardiac amyloidosis, heart failure, pericardial effusion (sp pericardial window), atrial fibrillation, CVA, CKD stage IV, T2DM and prior cardiac arrest (PEA), who presented with dyspnea. Reported lower extremity edema and dyspnea for about 2 weeks, associated with orthopnea, cough, and 10 lbs weight gain. On the day of hospitalization he was evaluated at the cardiology clinic, pre op evaluation for HD access, he was found volume overloaded and was referred to the ED for further evaluation. On his initial physical examination his blood pressure was 139/102, HR 71, RR 29 and 02 saturation 96%, lungs with bilateral rales with no wheezing, heart with S1 and S2 present and regular, abdomen soft and positive lower extremity edema.   Na 141, K 4,3 CL 105, bicarbonate 26, glucose 83 bun 73, cr 3,14 AST 46, ALT 19, total bilirubin 1,7  Wbc 2,3 hgb 11,6 plt 140  Sars covid 19 negative   Chest radiograph with hypoinflation, cardiomegaly, bilateral hilar vascular congestion, small left pleural effusion, pacemaker with one right atrial and one right ventricular lead. Defibrillator in place.   EKG 64 bpm, right axis deviation, normal intervals, ventricular paced rhythm with underlying atrial flutter, with no significant ST segment or T wave changes.   10/17 patient placed on IV furosemide, continue volume overloaded.  10/18 improved diuresis.   Assessment and Plan: * Acute on chronic systolic CHF (congestive heart failure) (HCC) Echocardiogram reduced LV systolic function with EF 20 to 25%, global hypokinesis, severe LVH, RV with severe reduction in systolic function, RV mild enlargement, RVSP 48,1 LA  with mild dilatation, RA with moderate dilatation,   Documented urine output 2,950 cc Systolic blood pressure 111 to 105 mmHg.  Volume status is improving.   Continue with furosemide to 80 mg IV q8 hrs 10/17 metolazone 2.5 mg  Continue to hold on hydralazine to avoid hypotension.  Limited pharmacologic options due to acute worsening GFR.   Acute hypoxemic respiratory due to acute cardiogenic pulmonary edema.  Plan to continue diuresis with IV furosemide. 02 saturation is 97% on 2 L/min per [place].   V/Q scan with no evidence of pulmonary embolism.   Cardiac amyloidosis, continue with tafamidis.   Acute renal failure superimposed on stage 4 chronic kidney disease (HCC) Urine output and volume status is improving. Pending blood work this morning.   Continue diuretic therapy with furosemide. Avoid hypotension.   Paroxysmal atrial fibrillation (HCC) Patient with paced rhythm, on telemetry, pacing with artifact, due to biventricular pacing.  Underlying atrial fibrillation.  12/28/22 CRT pacemaker implantation, BiV pacing.  Will continue telemetry monitoring.   Continue anticoagulation with apixaban.   Essential hypertension Continue aggressive diuresis, hold on antihypertensive agents for now to avoid hypotension.   CAD (coronary artery disease) No chest pain, no acute coronary syndrome.   Non-insulin dependent type 2 diabetes mellitus (HCC) Hypoglycemia.   Capillary glucose 97, 85, 141. Continue to hold on insulin therapy and continue capillary glucose monitoring.    Right shoulder pain Radiograph with no fractures.  Positive degenerative changes, acromiohumeral abutment consistent with chronic rotator cuff arthropathy and likely degenerative rotator cuff tear.   Prostate cancer (HCC) Follow up as outpatient.  Subjective: Patient is feeling better than admission, dyspnea is improving, but not yet back to baseline   Physical Exam: Vitals:   03/01/23 2305  03/02/23 0414 03/02/23 0548 03/02/23 0740  BP: 127/77 111/83  105/79  Pulse: 60 60    Resp: (!) 25 17    Temp: (!) 97.4 F (36.3 C) 99.6 F (37.6 C)  98 F (36.7 C)  TempSrc: Axillary Axillary  Axillary  SpO2: 94% 96%    Weight:   87.4 kg   Height:       Neurology awake and alert ENT with mild pallor Cardiovascular with S1 and S2 present and irregular with no gallops, or rubs, systolic right lower sternal border murmur. Positive JVD Trace lower extremity edema Respiratory with expiratory wheezing with scattered rales with no rhonchi Abdomen with no distention  Data Reviewed:    Family Communication: no family at the bedside   Disposition: Status is: Inpatient Remains inpatient appropriate because: volume overload   Planned Discharge Destination: Home      Author: Coralie Keens, MD 03/02/2023 10:16 AM  For on call review [email or web address].

## 2023-03-03 DIAGNOSIS — I5023 Acute on chronic systolic (congestive) heart failure: Secondary | ICD-10-CM | POA: Diagnosis not present

## 2023-03-03 DIAGNOSIS — N179 Acute kidney failure, unspecified: Secondary | ICD-10-CM | POA: Diagnosis not present

## 2023-03-03 DIAGNOSIS — I10 Essential (primary) hypertension: Secondary | ICD-10-CM | POA: Diagnosis not present

## 2023-03-03 DIAGNOSIS — I480 Paroxysmal atrial fibrillation: Secondary | ICD-10-CM | POA: Diagnosis not present

## 2023-03-03 LAB — BASIC METABOLIC PANEL
Anion gap: 14 (ref 5–15)
BUN: 68 mg/dL — ABNORMAL HIGH (ref 8–23)
CO2: 25 mmol/L (ref 22–32)
Calcium: 9.9 mg/dL (ref 8.9–10.3)
Chloride: 98 mmol/L (ref 98–111)
Creatinine, Ser: 3.09 mg/dL — ABNORMAL HIGH (ref 0.61–1.24)
GFR, Estimated: 20 mL/min — ABNORMAL LOW (ref >60)
Glucose, Bld: 103 mg/dL — ABNORMAL HIGH (ref 70–99)
Potassium: 4.7 mmol/L (ref 3.5–5.1)
Sodium: 137 mmol/L (ref 135–145)

## 2023-03-03 LAB — GLUCOSE, CAPILLARY
Glucose-Capillary: 103 mg/dL — ABNORMAL HIGH (ref 70–99)
Glucose-Capillary: 113 mg/dL — ABNORMAL HIGH (ref 70–99)
Glucose-Capillary: 122 mg/dL — ABNORMAL HIGH (ref 70–99)
Glucose-Capillary: 163 mg/dL — ABNORMAL HIGH (ref 70–99)
Glucose-Capillary: 46 mg/dL — ABNORMAL LOW (ref 70–99)
Glucose-Capillary: 65 mg/dL — ABNORMAL LOW (ref 70–99)
Glucose-Capillary: 73 mg/dL (ref 70–99)
Glucose-Capillary: 96 mg/dL (ref 70–99)

## 2023-03-03 LAB — MAGNESIUM: Magnesium: 2.7 mg/dL — ABNORMAL HIGH (ref 1.7–2.4)

## 2023-03-03 MED ORDER — GABAPENTIN 100 MG PO CAPS
100.0000 mg | ORAL_CAPSULE | Freq: Three times a day (TID) | ORAL | Status: DC
  Administered 2023-03-03 – 2023-03-06 (×12): 100 mg via ORAL
  Filled 2023-03-03 (×12): qty 1

## 2023-03-03 NOTE — Progress Notes (Addendum)
Progress Note   Patient: Corey [name] VVO:[id number] DOB: [birth year] DOA: 02/27/2023     3 DOS: the patient was seen and examined on 03/03/2023   Brief hospital course: Corey [name] was admitted to the hospital with the working diagnosis of heart failure exacerbation.   77 yo male with the past medical history of cardiac amyloidosis, heart failure, pericardial effusion (sp pericardial window), atrial fibrillation, CVA, CKD stage IV, T2DM and prior cardiac arrest (PEA), who presented with dyspnea. Reported lower extremity edema and dyspnea for about 2 weeks, associated with orthopnea, cough, and 10 lbs weight gain. On the day of hospitalization he was evaluated at the cardiology clinic, pre op evaluation for HD access, he was found volume overloaded and was referred to the ED for further evaluation. On his initial physical examination his blood pressure was 139/102, HR 71, RR 29 and 02 saturation 96%, lungs with bilateral rales with no wheezing, heart with S1 and S2 present and regular, abdomen soft and positive lower extremity edema.   Na 141, K 4,3 CL 105, bicarbonate 26, glucose 83 bun 73, cr 3,14 AST 46, ALT 19, total bilirubin 1,7  Wbc 2,3 hgb 11,6 plt 140  Sars covid 19 negative   Chest radiograph with hypoinflation, cardiomegaly, bilateral hilar vascular congestion, small left pleural effusion, pacemaker with one right atrial and one right ventricular lead. Defibrillator in place.   EKG 64 bpm, right axis deviation, normal intervals, ventricular paced rhythm with underlying atrial flutter, with no significant ST segment or T wave changes.   10/17 patient placed on IV furosemide, continue volume overloaded.  10/18 improved diuresis.  10/19 tolerating well IV furosemide, but continue volume overloaded.   Assessment and Plan: * Acute on chronic systolic CHF (congestive heart failure) (HCC) Echocardiogram reduced LV systolic function with EF 20 to 25%, global hypokinesis, severe LVH, RV with  severe reduction in systolic function, RV mild enlargement, RVSP 48,1 LA with mild dilatation, RA with moderate dilatation,   Urine output 2,250 cc Systolic blood pressure 120 to 146 mmHg.  Volume status is improving.    Continue with furosemide to 80 mg IV q8 hrs 10/17 metolazone 2.5 mg  Holding on hydralazine to avoid hypotension.  Limited pharmacologic options due to acute worsening GFR.   Acute hypoxemic respiratory due to acute cardiogenic pulmonary edema.  V/Q scan with no evidence of pulmonary embolism.   Plan to continue diuresis with IV furosemide. 02 saturation is 91% to 85% on room air.   Cardiac amyloidosis, continue with tafamidis.   Acute renal failure superimposed on stage 4 chronic kidney disease (HCC) Renal function today with serum cr at 3,0 with K at 4,7 and serum bicarbonate at 25 Na 137 and Mg 2.7   Plan to continue diuresis with furosemide.  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 Plan is for left basilic vein versus more likely graft placement.  Will check with vascular surgery and nephrology during this admission.   Paroxysmal atrial fibrillation (HCC) Continue ventricular paced rhythm, on telemetry, underlying atrial fibrillation to atrial flutter. Positive frequent PVC on telemetry today (personally reviewed).  Marland Kitchen  12/28/22 CRT pacemaker implantation, BiV pacing.  Will continue telemetry monitoring.   Continue anticoagulation with apixaban.   Essential hypertension Continue aggressive diuresis. Holding on antihypertensive agents for now to avoid hypotension.   CAD (coronary artery disease) No chest pain, no acute coronary syndrome.   Non-insulin dependent type 2 diabetes mellitus (HCC) Hypoglycemia.   Fasting glucose  this am 68 mg/dl.  Continue to hold on insulin therapy and continue capillary glucose monitoring.  Hypoglycemia protocol.    Hand and feet diabetic neuropathy, with pain that limits  mobility Will do a trial of oral gabapentin, and follow up response.   Right shoulder pain Radiograph with no fractures.  Positive degenerative changes, acromiohumeral abutment consistent with chronic rotator cuff arthropathy and likely degenerative rotator cuff tear.   Prostate cancer (HCC) Follow up as outpatient.         Subjective: Patient with improvement in dyspnea but not back to baseline, no chest pain,   Physical Exam: Vitals:   03/02/23 2012 03/02/23 2335 03/03/23 0321 03/03/23 0700  BP: 114/82 120/86 (!) 146/95 (!) 146/93  Pulse: (!) 57 (!) 56 68   Resp: 18 (!) 25    Temp: 98.5 F (36.9 C) 100 F (37.8 C) 99.9 F (37.7 C) 100 F (37.8 C)  TempSrc: Oral Axillary Axillary Oral  SpO2: 91% 91% (!) 85%   Weight:   84.3 kg   Height:       Neurology awake and alert ENT with mild pallor Cardiovascular with S1 and S2 present and regular with no gallops, positive systolic murmur at the right lower sternal border Positive JVD Trace lower extremity edema,  Respiratory with scattered rales with no wheezing or rhonchi Abdomen with no distention  Data Reviewed:    Family Communication: no family at the bedside   Disposition: Status is: Inpatient Remains inpatient appropriate because: volume overloaded on IV diuresis   Planned Discharge Destination: Home    Author: Coralie Keens, MD 03/03/2023 9:49 AM  For on call review [email or web address].

## 2023-03-03 NOTE — Progress Notes (Signed)
Mobility Specialist Progress Note:   03/03/23 1028  Mobility  Activity Transferred from bed to chair  Level of Assistance Minimal assist, patient does 75% or more  Assistive Device Front wheel walker  Activity Response Tolerated well  Mobility Referral Yes  $Mobility charge 1 Mobility  Mobility Specialist Start Time (ACUTE ONLY) 0920  Mobility Specialist Stop Time (ACUTE ONLY) 0945  Mobility Specialist Time Calculation (min) (ACUTE ONLY) 25 min    Pre Mobility: 74 HR ,  99% SpO2 2 L During Mobility: 82 HR , 92% SpO2 RA Post Mobility: 65 HR ,94% SpO2 2 L  Pt received in bed, agreeable to mobility. MinA bed mobility and to stand. Pt needing extra time to straighten knees and back d/t general weakness. Deferred ambulation with pt d/t high fall risk with general weakness and gout pain, but pt agreeable to stand and pivot to chair. Pt able to stand and pivot with slow shuffles and CG for safety. Pt left in chair with call bell in reach and all needs met.   Leory Plowman  Mobility Specialist Please contact via [employer] office at [phone]

## 2023-03-03 NOTE — TOC Initial Note (Addendum)
Transition of Care [facility]) - Initial/Assessment Note    Patient Details  Name: Corey [name] MRN: [id number] Date of Birth: 11/11/1945  Transition of Care [facility]) CM/SW Contact:    Ronny Bacon, RN Phone Number: 03/03/2023, 8:10 AM  Clinical Narrative:   Patient from home. Discussed PT/OT recommendations for home health, BSC 3:1 and shower bench. Patient used  [place] home health services in the past. Referral sent to [facility] who is able to accept patient for [facility] services.              Expected Discharge Plan: Home w Home Health Services Barriers to Discharge: Continued Medical Work up   Patient Goals and CMS Choice            Expected Discharge Plan and Services       Living arrangements for the past 2 months: Single Family Home                                      Prior Living Arrangements/Services Living arrangements for the past 2 months: Single Family Home Lives with:: Spouse Patient language and need for interpreter reviewed:: Yes Do you feel safe going back to the place where you live?: Yes      Need for Family Participation in Patient Care: Yes (Comment) Care giver support system in place?: Yes (comment)   Criminal Activity/Legal Involvement Pertinent to Current Situation/Hospitalization: No - Comment as needed  Activities of Daily Living   ADL Screening (condition at time of admission) Independently performs ADLs?: Yes (appropriate for developmental age) Is the patient deaf or have difficulty hearing?: Yes Does the patient have difficulty seeing, even when wearing glasses/contacts?: Yes Does the patient have difficulty concentrating, remembering, or making decisions?: No  Permission Sought/Granted                  Emotional Assessment Appearance:: Appears stated age Attitude/Demeanor/Rapport: Engaged Affect (typically observed): Appropriate Orientation: : Oriented to Self, Oriented to Place, Oriented to  Time, Oriented to  Situation Alcohol / Substance Use: Not Applicable Psych Involvement: No (comment)  Admission diagnosis:  Acute exacerbation of CHF (congestive heart failure) (HCC) [I50.9] Patient Active Problem List   Diagnosis Date Noted   Acute on chronic systolic CHF (congestive heart failure) (HCC) 02/28/2023   Acute renal failure superimposed on stage 4 chronic kidney disease (HCC) 02/28/2023   Elevated d-dimer 02/28/2023   Hypoglycemia 02/28/2023   Right shoulder pain 02/28/2023   Generalized weakness 02/28/2023   Acute heart failure with reduced ejection fraction and diastolic dysfunction (HCC) 12/26/2022   Symptomatic bradycardia 12/26/2022   Atrial flutter (HCC) 07/24/2022   Bilateral low back pain without sciatica 04/10/2022   Acute on chronic congestive heart failure (HCC) 03/25/2022   Acute on chronic combined systolic and diastolic heart failure (HCC) 03/23/2022   Paroxysmal atrial fibrillation (HCC) 01/12/2022   Acute cough 01/05/2022   Bilateral edema of lower extremity 01/05/2022   Leg wound, right, initial encounter 10/24/2021   Weakness of both lower extremities 10/24/2021   Chronic kidney disease (CKD), stage IV (severe) (HCC) 09/29/2021   Pure hypercholesterolemia 07/05/2021   Preoperative cardiovascular examination 04/04/2021   HFrEF (heart failure with reduced ejection fraction) (HCC) 04/03/2021   Bilateral hearing loss 01/04/2021   Bilateral impacted cerumen 01/04/2021   Tympanic membrane perforation, left 01/04/2021   Physical deconditioning 10/16/2020   Hereditary cardiac amyloidosis (HCC) 09/30/2020  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   Progressive angina (HCC) - Atypical; DOE 09/20/2020   CAD (coronary artery disease) 09/20/2020   Soft tissue mass 07/30/2020   Contracture of joint of finger of left hand 07/30/2020   DOE (dyspnea on exertion) 03/01/2019   Mild cognitive impairment 12/05/2018   Muscle twitching 07/23/2018   OSA (obstructive sleep apnea)  03/27/2018   Snores 01/21/2018   Poor balance 01/23/2017   Ankle swelling, left 09/06/2016   Bradycardia 01/28/2016   Weak urinary stream 12/31/2015   Ventral hernia without obstruction or gangrene 09/15/2015   Prostate cancer (HCC) 07/25/2015   Erectile dysfunction 05/04/2015   Urinary frequency 05/04/2015   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   Abnormal finding on MRI of brain 02/14/2015   Leg wound, right 01/08/2013   PEA (Pulseless electrical activity) (HCC) 12/03/2012   Acute hypoxemic respiratory failure (HCC) 09/30/2012   Non-insulin dependent type 2 diabetes mellitus (HCC) 06/30/2011   Gout 03/05/2007   Essential hypertension 03/05/2007   PCP:  Pincus Sanes, MD Pharmacy:   [id number] - [place], [place] - [address] [place] [address] Phone: [phone] Fax: [phone]     Social Determinants of Health (SDOH) Social History: SDOH Screenings   Food Insecurity: No Food Insecurity (03/01/2023)  Housing: Low Risk  (03/01/2023)  Transportation Needs: No Transportation Needs (03/01/2023)  Utilities: Not At Risk (03/01/2023)  Alcohol Screen: Low Risk  (02/08/2023)  Depression (PHQ2-9): Low Risk  (02/08/2023)  Financial Resource Strain: Low Risk  (02/08/2023)  Physical Activity: Insufficiently Active (02/08/2023)  Social Connections: Socially Integrated (02/08/2023)  Stress: No Stress Concern Present (02/08/2023)  Tobacco Use: Medium Risk (02/27/2023)  Health Literacy: Adequate Health Literacy (02/08/2023)   SDOH Interventions:     Readmission Risk Interventions    09/30/2020    2:29 PM  Readmission Risk Prevention Plan  Transportation Screening Complete  PCP or Specialist Appt within 5-7 Days Complete  Home Care Screening Complete  Medication Review (RN CM) Complete

## 2023-03-04 DIAGNOSIS — I480 Paroxysmal atrial fibrillation: Secondary | ICD-10-CM | POA: Diagnosis not present

## 2023-03-04 DIAGNOSIS — I5023 Acute on chronic systolic (congestive) heart failure: Secondary | ICD-10-CM | POA: Diagnosis not present

## 2023-03-04 DIAGNOSIS — I2510 Atherosclerotic heart disease of native coronary artery without angina pectoris: Secondary | ICD-10-CM | POA: Diagnosis not present

## 2023-03-04 DIAGNOSIS — N179 Acute kidney failure, unspecified: Secondary | ICD-10-CM | POA: Diagnosis not present

## 2023-03-04 LAB — BASIC METABOLIC PANEL
Anion gap: 13 (ref 5–15)
BUN: 73 mg/dL — ABNORMAL HIGH (ref 8–23)
CO2: 26 mmol/L (ref 22–32)
Calcium: 9.5 mg/dL (ref 8.9–10.3)
Chloride: 97 mmol/L — ABNORMAL LOW (ref 98–111)
Creatinine, Ser: 2.92 mg/dL — ABNORMAL HIGH (ref 0.61–1.24)
GFR, Estimated: 22 mL/min — ABNORMAL LOW (ref >60)
Glucose, Bld: 85 mg/dL (ref 70–99)
Potassium: 4.4 mmol/L (ref 3.5–5.1)
Sodium: 136 mmol/L (ref 135–145)

## 2023-03-04 LAB — GLUCOSE, CAPILLARY
Glucose-Capillary: 68 mg/dL — ABNORMAL LOW (ref 70–99)
Glucose-Capillary: 104 mg/dL — ABNORMAL HIGH (ref 70–99)
Glucose-Capillary: 84 mg/dL (ref 70–99)
Glucose-Capillary: 133 mg/dL — ABNORMAL HIGH (ref 70–99)
Glucose-Capillary: 101 mg/dL — ABNORMAL HIGH (ref 70–99)
Glucose-Capillary: 168 mg/dL — ABNORMAL HIGH (ref 70–99)
Glucose-Capillary: 168 mg/dL — ABNORMAL HIGH (ref 70–99)

## 2023-03-04 NOTE — Progress Notes (Signed)
Progress Note   Patient: Corey [name]:[id number] DOB: 03-Dec-1945 DOA: 02/27/2023     4 DOS: the patient was seen and examined on 03/04/2023   Brief hospital course: Corey [name] was admitted to the hospital with the working diagnosis of heart failure exacerbation.   77 yo male with the past medical history of cardiac amyloidosis, heart failure, pericardial effusion (sp pericardial window), atrial fibrillation, CVA, CKD stage IV, T2DM and prior cardiac arrest (PEA), who presented with dyspnea. Reported lower extremity edema and dyspnea for about 2 weeks, associated with orthopnea, cough, and 10 lbs weight gain. On the day of hospitalization he was evaluated at the cardiology clinic, pre op evaluation for HD access, he was found volume overloaded and was referred to the ED for further evaluation. On his initial physical examination his blood pressure was 139/102, HR 71, RR 29 and 02 saturation 96%, lungs with bilateral rales with no wheezing, heart with S1 and S2 present and regular, abdomen soft and positive lower extremity edema.   Na 141, K 4,3 CL 105, bicarbonate 26, glucose 83 bun 73, cr 3,14 AST 46, ALT 19, total bilirubin 1,7  Wbc 2,3 hgb 11,6 plt 140  Sars covid 19 negative   Chest radiograph with hypoinflation, cardiomegaly, bilateral hilar vascular congestion, small left pleural effusion, pacemaker with one right atrial and one right ventricular lead. Defibrillator in place.   EKG 64 bpm, right axis deviation, normal intervals, ventricular paced rhythm with underlying atrial flutter, with no significant ST segment or T wave changes.   10/17 patient placed on IV furosemide, continue volume overloaded.  10/18 improved diuresis.  10/19 tolerating well IV furosemide, but continue volume overloaded.   Assessment and Plan: * Acute on chronic systolic CHF (congestive heart failure) (HCC) Echocardiogram reduced LV systolic function with EF 20 to 25%, global hypokinesis, severe LVH, RV with  severe reduction in systolic function, RV mild enlargement, RVSP 48,1 LA with mild dilatation, RA with moderate dilatation,   Urine output 2,825 cc Systolic blood pressure 124 to 108 mmHg.  Volume status is improving.    Continue with furosemide to 80 mg IV q8 hrs 10/17 metolazone 2.5 mg  Holding on hydralazine to avoid hypotension.  Limited pharmacologic options due to acute worsening GFR.   Acute hypoxemic respiratory due to acute cardiogenic pulmonary edema.  V/Q scan with no evidence of pulmonary embolism.   Plan to continue diuresis with IV furosemide. 02 saturation is 100 on 2 L/min per [place].   Cardiac amyloidosis, continue with tafamidis.   Acute renal failure superimposed on stage 4 chronic kidney disease (HCC) Serum cr today 2,9 with K at 4,4 and serum bicarbonate at 26.  Na 136.   Continue diuresis with furosemide IV, 80 mg q 8 hrs, to target further negative fluid balance.   Plan to continue diuresis with furosemide.  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 Plan is for left basilic vein versus more likely graft placement.  Will check with vascular surgery and nephrology during this admission.   Paroxysmal atrial fibrillation (HCC) Continue ventricular paced rhythm, on telemetry, underlying atrial fibrillation to atrial flutter.  Marland Kitchen  12/28/22 CRT pacemaker implantation, BiV pacing.  Will continue telemetry monitoring.   Continue anticoagulation with apixaban.   Essential hypertension Continue aggressive diuresis. Holding on antihypertensive agents for now to avoid hypotension.   CAD (coronary artery disease) No chest pain, no acute coronary syndrome.   Non-insulin dependent type 2 diabetes mellitus (HCC) Hypoglycemia.  Fasting glucose this am 73 mg/dl.  Continue to hold on insulin therapy and continue capillary glucose monitoring.  Hypoglycemia protocol.    Hand and feet diabetic neuropathy, with pain that limits  mobility Continue with gabapentin.   Right shoulder pain Radiograph with no fractures.  Positive degenerative changes, acromiohumeral abutment consistent with chronic rotator cuff arthropathy and likely degenerative rotator cuff tear.   Prostate cancer (HCC) Follow up as outpatient.         Subjective: Patient is feeling better, dyspnea and edema are improving, he continue to have feet pain at the soles, affecting his ambulation   Physical Exam: Vitals:   03/03/23 1933 03/03/23 2328 03/04/23 0355 03/04/23 0735  BP: 115/83 130/85 124/69 108/76  Pulse: 61 64 61 60  Resp: 20 19 20  (!) 25  Temp: 98.7 F (37.1 C) 98.8 F (37.1 C) 98.9 F (37.2 C) 98.6 F (37 C)  TempSrc: Oral Oral Oral Oral  SpO2: 100% 100% 100% 100%  Weight:   76.4 kg   Height:       Neurology awake and alert ENT with mild pallor Cardiovascular with S1 and S2 present and regular, positive systolic murmur at the right lower sternal border, no gallops or rubs Positive JVD Respiratory with no rales or wheezing, no rhonchi Abdomen with no distention  Trace lower extremity edema  Data Reviewed:    Family Communication: no family at the bedside   Disposition: Status is: Inpatient Remains inpatient appropriate because: recovering volume overload   Planned Discharge Destination: Home    Author: Coralie Keens, MD 03/04/2023 10:04 AM  For on call review [email or web address].

## 2023-03-04 NOTE — Plan of Care (Signed)
  Problem: Education: Goal: Understanding of CV disease, CV risk reduction, and recovery process will improve Outcome: Progressing Goal: Individualized Educational Video(s) Outcome: Progressing   Problem: Activity: Goal: Ability to return to baseline activity level will improve Outcome: Progressing   Problem: Cardiovascular: Goal: Ability to achieve and maintain adequate cardiovascular perfusion will improve Outcome: Progressing Goal: Vascular access site(s) Level 0-1 will be maintained Outcome: Progressing   Problem: Health Behavior/Discharge Planning: Goal: Ability to safely manage health-related needs after discharge will improve Outcome: Progressing   Problem: Education: Goal: Knowledge of cardiac device and self-care will improve Outcome: Progressing Goal: Ability to safely manage health related needs after discharge will improve Outcome: Progressing Goal: Individualized Educational Video(s) Outcome: Progressing   Problem: Cardiac: Goal: Ability to achieve and maintain adequate cardiopulmonary perfusion will improve Outcome: Progressing   Problem: Education: Goal: Understanding of disease, treatment, and recovery process will improve Outcome: Progressing   Problem: Activity: Goal: Ability to return to baseline activity level will improve Outcome: Progressing   Problem: Cardiac: Goal: Ability to maintain adequate cardiovascular perfusion will improve Outcome: Progressing Goal: Vascular access site(s) Level 0-1 will be maintained Outcome: Progressing   Problem: Health Behavior/ Discharge Planning: Goal: Ability to safely manage health related needs after discharge Outcome: Progressing   Problem: Education: Goal: Knowledge of General Education information will improve Description: Including pain rating scale, medication(s)/side effects and non-pharmacologic comfort measures Outcome: Progressing   Problem: Health Behavior/Discharge Planning: Goal: Ability to  manage health-related needs will improve Outcome: Progressing   Problem: Clinical Measurements: Goal: Ability to maintain clinical measurements within normal limits will improve Outcome: Progressing Goal: Will remain free from infection Outcome: Progressing Goal: Diagnostic test results will improve Outcome: Progressing Goal: Respiratory complications will improve Outcome: Progressing Goal: Cardiovascular complication will be avoided Outcome: Progressing   Problem: Activity: Goal: Risk for activity intolerance will decrease Outcome: Progressing   Problem: Nutrition: Goal: Adequate nutrition will be maintained Outcome: Progressing   Problem: Coping: Goal: Level of anxiety will decrease Outcome: Progressing   Problem: Elimination: Goal: Will not experience complications related to bowel motility Outcome: Progressing Goal: Will not experience complications related to urinary retention Outcome: Progressing   Problem: Pain Managment: Goal: General experience of comfort will improve Outcome: Progressing   Problem: Safety: Goal: Ability to remain free from injury will improve Outcome: Progressing   Problem: Skin Integrity: Goal: Risk for impaired skin integrity will decrease Outcome: Progressing   Problem: Education: Goal: Ability to demonstrate management of disease process will improve Outcome: Progressing Goal: Ability to verbalize understanding of medication therapies will improve Outcome: Progressing Goal: Individualized Educational Video(s) Outcome: Progressing   Problem: Activity: Goal: Capacity to carry out activities will improve Outcome: Progressing   Problem: Cardiac: Goal: Ability to achieve and maintain adequate cardiopulmonary perfusion will improve Outcome: Progressing

## 2023-03-04 NOTE — Progress Notes (Signed)
Mobility Specialist Progress Note:   03/04/23 1318  Mobility  Activity Transferred from bed to chair  Level of Assistance Minimal assist, patient does 75% or more  Assistive Device Front wheel walker  Distance Ambulated (ft) 5 ft  Activity Response Tolerated well  Mobility Referral Yes  $Mobility charge 1 Mobility  Mobility Specialist Start Time (ACUTE ONLY) 1120  Mobility Specialist Stop Time (ACUTE ONLY) 1130  Mobility Specialist Time Calculation (min) (ACUTE ONLY) 10 min   Pre Mobility: 69 HR  Post Mobility: 66 HR   Pt received EOB, c/o gout pain but agreeable to mobility. Pt able to stand with MinA and pivot to chair with slow steps and CG for safety. Pt asymptomatic throughout. Pt left in chair with call bell in reach and all needs met.  Leory Plowman  Mobility Specialist Please contact via [employer] office at [phone]

## 2023-03-04 NOTE — Progress Notes (Signed)
Hypoglycemic Event  CBG: 64   @0513   Treatment: 8 oz juice/soda  Symptoms: Sweaty  Follow-up CBG: [id number] CBG Result:104  Possible Reasons for Event: Inadequate meal intake  Comments/MD notified: MD aware  Patient encouraged to eat and drink.     Jodean Lima

## 2023-03-05 DIAGNOSIS — I5023 Acute on chronic systolic (congestive) heart failure: Secondary | ICD-10-CM | POA: Diagnosis not present

## 2023-03-05 DIAGNOSIS — N179 Acute kidney failure, unspecified: Secondary | ICD-10-CM | POA: Diagnosis not present

## 2023-03-05 DIAGNOSIS — I480 Paroxysmal atrial fibrillation: Secondary | ICD-10-CM | POA: Diagnosis not present

## 2023-03-05 DIAGNOSIS — I10 Essential (primary) hypertension: Secondary | ICD-10-CM | POA: Diagnosis not present

## 2023-03-05 LAB — BASIC METABOLIC PANEL
Anion gap: 12 (ref 5–15)
BUN: 86 mg/dL — ABNORMAL HIGH (ref 8–23)
CO2: 27 mmol/L (ref 22–32)
Calcium: 9.2 mg/dL (ref 8.9–10.3)
Chloride: 97 mmol/L — ABNORMAL LOW (ref 98–111)
Creatinine, Ser: 3.16 mg/dL — ABNORMAL HIGH (ref 0.61–1.24)
GFR, Estimated: 20 mL/min — ABNORMAL LOW (ref >60)
Glucose, Bld: 143 mg/dL — ABNORMAL HIGH (ref 70–99)
Potassium: 4.4 mmol/L (ref 3.5–5.1)
Sodium: 136 mmol/L (ref 135–145)

## 2023-03-05 LAB — GLUCOSE, CAPILLARY
Glucose-Capillary: 113 mg/dL — ABNORMAL HIGH (ref 70–99)
Glucose-Capillary: 158 mg/dL — ABNORMAL HIGH (ref 70–99)
Glucose-Capillary: 176 mg/dL — ABNORMAL HIGH (ref 70–99)
Glucose-Capillary: 176 mg/dL — ABNORMAL HIGH (ref 70–99)
Glucose-Capillary: 187 mg/dL — ABNORMAL HIGH (ref 70–99)
Glucose-Capillary: 204 mg/dL — ABNORMAL HIGH (ref 70–99)

## 2023-03-05 LAB — CBC
HCT: 36.9 % — ABNORMAL LOW (ref 39.0–52.0)
Hemoglobin: 11.2 g/dL — ABNORMAL LOW (ref 13.0–17.0)
MCH: 25.1 pg — ABNORMAL LOW (ref 26.0–34.0)
MCHC: 30.4 g/dL (ref 30.0–36.0)
MCV: 82.6 fL (ref 80.0–100.0)
Platelets: 133 10*3/uL — ABNORMAL LOW (ref 150–400)
RBC: 4.47 MIL/uL (ref 4.22–5.81)
RDW: 19.7 % — ABNORMAL HIGH (ref 11.5–15.5)
WBC: 2.8 10*3/uL — ABNORMAL LOW (ref 4.0–10.5)
nRBC: 0 % (ref 0.0–0.2)

## 2023-03-05 MED ORDER — TORSEMIDE 20 MG PO TABS
40.0000 mg | ORAL_TABLET | Freq: Two times a day (BID) | ORAL | Status: DC
  Filled 2023-03-05: qty 2

## 2023-03-05 NOTE — Progress Notes (Signed)
Physical Therapy Treatment Patient Details Name: Corey [name] MRN: [id number] DOB: 1945/08/21 Today's Date: 03/05/2023   History of Present Illness Pt is a 77 y.o. male presenting 10/15 from cardiologist office with distended abdomen, BLE swelling and weeping. CXR showing mild pulmonary interstitial edema and L>R pleural effusions with adjacent airspace opacity which may reflect atelectasis or infection. Pt also with R shoulder pain post recent fall at home with radiograph with no fractures but positive for degenerative changes and consistent with chronic rotator cuff arthropathy and likely degenerative rotator cuff tear. PMH significant for cardiac amyloidosis (ATTR), HFrEF, PEA arrest, pericardial effusion, hypertension, hyperlipidemia, CKD stage IV, DMII, CVA, A-fib/flutter s/p CTI ablation March 2024 on Eliquis, s/p CRT placement August 2024, gout, prostate cancer, seizures.    PT Comments  Patient with decline since evaluation though walking more today than last few visits.  States feels feet are some better.  Still with poor balance leaning posteriorly, very short shuffling gait and limited activity tolerance.  Feel he would be high fall risk even assisted by family at home based on prior level of function.  Recommend inpatient rehab (>3 hours/day) prior to d/c home.  PT will continue to follow in acute setting.     If plan is discharge home, recommend the following: A little help with walking and/or transfers;Assistance with cooking/housework;Assist for transportation;Help with stairs or ramp for entrance;A little help with bathing/dressing/bathroom   Can travel by private vehicle        Equipment Recommendations  Other (comment) (shower chair)    Recommendations for Other Services       Precautions / Restrictions Precautions Precautions: Fall     Mobility  Bed Mobility               General bed mobility comments: in recliner on arrival    Transfers Overall transfer  level: Needs assistance Equipment used: Rolling walker (2 wheels) Transfers: Sit to/from Stand Sit to Stand: Mod assist           General transfer comment: some lifting help to stand from recliner; practiced x 3 from EOB again with lifting help with limited anterior weight shift despite cues    Ambulation/Gait Ambulation/Gait assistance: Min assist Gait Distance (Feet): 16 Feet (x 2) Assistive device: Rolling walker (2 wheels) Gait Pattern/deviations: Step-to pattern, Decreased stride length, Shuffle, Wide base of support, Leaning posteriorly       General Gait Details: assist to walk around bed with help for walker management at times, assist for balance with occasional buckling and help for lines; walked back around bed to recliner per pt request   Stairs             Wheelchair Mobility     Tilt Bed    Modified Rankin (Stroke Patients Only)       Balance Overall balance assessment: Needs assistance   Sitting balance-Leahy Scale: Fair     Standing balance support: Bilateral upper extremity supported, During functional activity, Reliant on assistive device for balance Standing balance-Leahy Scale: Poor Standing balance comment: relies on RW and external support with some posterior bias                            Cognition Arousal: Alert Behavior During Therapy: WFL for tasks assessed/performed Overall Cognitive Status: Within Functional Limits for tasks assessed  Exercises Other Exercises Other Exercises: sit<>stand x 3 from EOB    General Comments General comments (skin integrity, edema, etc.): VSS on O2 with ambulation on 2L      Pertinent Vitals/Pain Pain Assessment Pain Assessment: Faces Faces Pain Scale: Hurts a little bit Pain Location: feet Pain Descriptors / Indicators: Aching, Grimacing, Guarding, Sore Pain Intervention(s): Monitored during session    Home Living                           Prior Function            PT Goals (current goals can now be found in the care plan section) Progress towards PT goals: Progressing toward goals    Frequency    Min 1X/week      PT Plan      Co-evaluation              AM-PAC PT "6 Clicks" Mobility   Outcome Measure  Help needed turning from your back to your side while in a flat bed without using bedrails?: A Little Help needed moving from lying on your back to sitting on the side of a flat bed without using bedrails?: A Little Help needed moving to and from a bed to a chair (including a wheelchair)?: A Lot Help needed standing up from a chair using your arms (e.g., wheelchair or bedside chair)?: A Lot Help needed to walk in hospital room?: A Lot Help needed climbing 3-5 steps with a railing? : Total 6 Click Score: 13    End of Session Equipment Utilized During Treatment: Gait belt;Oxygen Activity Tolerance: Patient tolerated treatment well Patient left: in chair;with call bell/phone within reach;with chair alarm set   PT Visit Diagnosis: Other abnormalities of gait and mobility (R26.89);Muscle weakness (generalized) (M62.81);History of falling (Z91.81)     Time: 5427-0623 PT Time Calculation (min) (ACUTE ONLY): 25 min  Charges:    $Gait Training: 8-22 mins $Therapeutic Activity: 8-22 mins PT General Charges $$ ACUTE PT VISIT: 1 Visit                     Corey [name], PT Acute Rehabilitation Services Office:[phone] 03/05/2023    Corey [name] 03/05/2023, 5:28 PM

## 2023-03-05 NOTE — Progress Notes (Signed)
Occupational Therapy Treatment Patient Details Name: Corey [name] MRN: [id number] DOB: 1945/12/24 Today's Date: 03/05/2023   History of present illness Pt is a 77 y.o. male presenting 10/15 from cardiologist office with distended abdomen, BLE swelling and weeping. CXR showing mild pulmonary interstitial edema and L>R pleural effusions with adjacent airspace opacity which may reflect atelectasis or infection. Pt also with R shoulder pain post recent fall at home with radiograph with no fractures but positive for degenerative changes and consistent with chronic rotator cuff arthropathy and likely degenerative rotator cuff tear. PMH significant for cardiac amyloidosis (ATTR), HFrEF, PEA arrest, pericardial effusion, hypertension, hyperlipidemia, CKD stage IV, DMII, CVA, A-fib/flutter s/p CTI ablation March 2024 on Eliquis, s/p CRT placement August 2024, gout, prostate cancer, seizures.   OT comments  Pt with slow progress toward established OT goals. Continues to present with general weakness, decreased balance, activity tolerance, and O2 dependence. Pt needing min A with and without AE for LB ADL. Performing BUE HEP with intermittent rest breaks. Able to transfer this date with RW, but limited in distance and needing assist for rise, steadying, and RW management. Recommending HH pending family ability to provide support at home. If family unable to provide assist, may consider inpatient rehab <3 hours/day follow up.       If plan is discharge home, recommend the following:  A little help with walking and/or transfers;A lot of help with bathing/dressing/bathroom;Assistance with cooking/housework;Assist for transportation;Help with stairs or ramp for entrance   Equipment Recommendations  BSC/3in1;Tub/shower seat    Recommendations for Other Services      Precautions / Restrictions Precautions Precautions: Fall Restrictions Weight Bearing Restrictions: No       Mobility Bed Mobility                General bed mobility comments: in recliner on arrival    Transfers Overall transfer level: Needs assistance Equipment used: Rolling walker (2 wheels) Transfers: Sit to/from Stand Sit to Stand: Min assist           General transfer comment: Min A for power up and upright positioning.     Balance Overall balance assessment: Needs assistance Sitting-balance support: Single extremity supported, Feet supported, No upper extremity supported Sitting balance-Leahy Scale: Good     Standing balance support: Bilateral upper extremity supported, During functional activity, Reliant on assistive device for balance Standing balance-Leahy Scale: Poor Standing balance comment: relies on RW and external support                           ADL either performed or assessed with clinical judgement   ADL Overall ADL's : Needs assistance/impaired         Upper Body Bathing: Set up;Sitting Upper Body Bathing Details (indicate cue type and reason): increased time         Lower Body Dressing: Minimal assistance;With adaptive equipment;Sitting/lateral leans Lower Body Dressing Details (indicate cue type and reason): to doff/don socks. Doffed first sock with min A with increased time. Introduced AE and min A due to decr dexterity and to optimize technique Toilet Transfer: Minimal assistance;Rolling walker (2 wheels);BSC/3in1 Toilet Transfer Details (indicate cue type and reason): simulated in room         Functional mobility during ADLs: Minimal assistance;Rolling walker (2 wheels) General ADL Comments: Assist for steps away from and back to chair for balance and RW management.    Extremity/Trunk Assessment Upper Extremity Assessment Upper Extremity Assessment: Right hand  dominant;Generalized weakness;RUE deficits/detail;LUE deficits/detail RUE Deficits / Details: Generalized weakness; Overall ROM WFL but with pt presenting with increased pain with shoulder AROM/AROM  with with radiograph this admission revealing no fractures but positive for degenerative changes and consistent with chronic rotator cuff arthropathy and likely degenerative rotator cuff tear. Moderately edematous hand and forearm noted. Mildly decreased fine motor coordinaiton and dexterity at baseline. RUE Coordination: decreased fine motor LUE Deficits / Details: Generalized weaknsess. Impaired range of motion in digits III through V of hand at baseline; decreased fine motor coordination; mildly edemotous hand noted LUE Coordination: decreased fine motor   Lower Extremity Assessment Lower Extremity Assessment: Defer to PT evaluation        Vision       Perception     Praxis      Cognition Arousal: Alert Behavior During Therapy: Flat affect Overall Cognitive Status: Within Functional Limits for tasks assessed                                 General Comments: Not formally tested. AAOx4 with cognition WFL for tasks performed. Able to follow 1 and 2 step directions consistently. Some increased time for new learning        Exercises Exercises: Other exercises Other Exercises Other Exercises: BUE AROM all joints. Poor wrist extension bilaterally with OT facilitation of ROM into extension of ~10 degrees. BUE elbow flexion x20; BUE shoulder flexion x20 to ~90 degrees; pronation/supinationx10; composite flexion/extensionx10.    Shoulder Instructions       General Comments HR 64-82 bpm; SpO2 as low as 80 but poor pleth    Pertinent Vitals/ Pain       Pain Assessment Pain Assessment: Faces Faces Pain Scale: Hurts little more Pain Location: RLE Pain Descriptors / Indicators: Aching, Grimacing, Guarding, Sore Pain Intervention(s): Limited activity within patient's tolerance, Monitored during session  Home Living                                          Prior Functioning/Environment              Frequency  Min 1X/week        Progress  Toward Goals  OT Goals(current goals can now be found in the care plan section)  Progress towards OT goals: Progressing toward goals  Acute Rehab OT Goals Patient Stated Goal: return home OT Goal Formulation: With patient Time For Goal Achievement: 03/15/23 Potential to Achieve Goals: Good ADL Goals Pt Will Perform Upper Body Bathing: with supervision;sitting Pt Will Perform Lower Body Bathing: with contact guard assist;sit to/from stand;sitting/lateral leans Pt Will Perform Upper Body Dressing: with supervision;sitting Pt Will Perform Lower Body Dressing: with supervision;sit to/from stand;sitting/lateral leans Pt Will Transfer to Toilet: with supervision;ambulating;bedside commode (with LRAD) Pt Will Perform Toileting - Clothing Manipulation and hygiene: with contact guard assist;sitting/lateral leans;sit to/from stand Pt/caregiver will Perform Home Exercise Program: Both right and left upper extremity;With theraband;Increased strength;With Supervision;With written HEP provided (Assist for steps away from and back to chair for balance and RW management.)  Plan      Co-evaluation                 AM-PAC OT "6 Clicks" Daily Activity     Outcome Measure   Help from another person eating meals?: None Help from another person taking  care of personal grooming?: A Little Help from another person toileting, which includes using toliet, bedpan, or urinal?: A Lot Help from another person bathing (including washing, rinsing, drying)?: A Lot Help from another person to put on and taking off regular upper body clothing?: A Little Help from another person to put on and taking off regular lower body clothing?: A Lot 6 Click Score: 16    End of Session Equipment Utilized During Treatment: Gait belt;Rolling walker (2 wheels);Oxygen  OT Visit Diagnosis: Other abnormalities of gait and mobility (R26.89);History of falling (Z91.81);Repeated falls (R29.6);Muscle weakness (generalized)  (M62.81);Pain   Activity Tolerance Patient tolerated treatment well   Patient Left in chair;with call bell/phone within reach;with chair alarm set   Nurse Communication Mobility status        Time: 1610-9604 OT Time Calculation (min): 31 min  Charges: OT General Charges $OT Visit: 1 Visit OT Treatments $Self Care/Home Management : 8-22 mins $Therapeutic Exercise: 8-22 mins  Myrla Halsted, OTD, OTR/L [facility] Acute Rehabilitation Office: [phone]   Myrla Halsted 03/05/2023, 10:45 AM

## 2023-03-05 NOTE — Progress Notes (Signed)
Mobility Specialist Progress Note:   03/05/23 0900  Mobility  Activity Transferred from bed to chair  Level of Assistance Moderate assist, patient does 50-74%  Assistive Device Front wheel walker  Distance Ambulated (ft) 4 ft  Activity Response Tolerated well  Mobility Referral Yes  $Mobility charge 1 Mobility  Mobility Specialist Start Time (ACUTE ONLY) [id number]  Mobility Specialist Stop Time (ACUTE ONLY) [id number]  Mobility Specialist Time Calculation (min) (ACUTE ONLY) 13 min    Pre Mobility: 53 HR,  93% SpO2 Post Mobility:  64 HR,  94% SpO2  Pt received in bed, agreeable to mobility. C/o BLE pain. Required modA for bed mobility and ambulation. Asymptomatic throughout. Pt left in chair with call bell and all needs met. Chair alarm on.  Corey [name] Mobility Specialist Please contact via Special educational needs teacher or Rehab office at [phone]

## 2023-03-05 NOTE — Care Management Important Message (Signed)
Important Message  Patient Details  Name: Corey [name] MRN: [id number] Date of Birth: Jun 04, 1945   Important Message Given:  Yes - Medicare IM     Dorena Bodo 03/05/2023, 1:46 PM

## 2023-03-05 NOTE — Plan of Care (Signed)
  Problem: Clinical Measurements: Goal: Will remain free from infection Outcome: Progressing Goal: Respiratory complications will improve Outcome: Progressing   Problem: Nutrition: Goal: Adequate nutrition will be maintained Outcome: Progressing   Problem: Coping: Goal: Level of anxiety will decrease Outcome: Progressing   Problem: Elimination: Goal: Will not experience complications related to urinary retention Outcome: Progressing   Problem: Pain Managment: Goal: General experience of comfort will improve Outcome: Progressing   Problem: Skin Integrity: Goal: Risk for impaired skin integrity will decrease Outcome: Progressing

## 2023-03-05 NOTE — Progress Notes (Addendum)
Progress Note   Patient: Corey [name] ZOX:[id number] DOB: August 19, 1945 DOA: 02/27/2023     5 DOS: the patient was seen and examined on 03/05/2023   Brief hospital course: Mr. Joseguadalupe was admitted to the hospital with the working diagnosis of heart failure exacerbation.   77 yo male with the past medical history of cardiac amyloidosis, heart failure, pericardial effusion (sp pericardial window), atrial fibrillation, CVA, CKD stage IV, T2DM and prior cardiac arrest (PEA), who presented with dyspnea. Reported lower extremity edema and dyspnea for about 2 weeks, associated with orthopnea, cough, and 10 lbs weight gain. On the day of hospitalization he was evaluated at the cardiology clinic, pre op evaluation for HD access, he was found volume overloaded and was referred to the ED for further evaluation. On his initial physical examination his blood pressure was 139/102, HR 71, RR 29 and 02 saturation 96%, lungs with bilateral rales with no wheezing, heart with S1 and S2 present and regular, abdomen soft and positive lower extremity edema.   Na 141, K 4,3 CL 105, bicarbonate 26, glucose 83 bun 73, cr 3,14 AST 46, ALT 19, total bilirubin 1,7  Wbc 2,3 hgb 11,6 plt 140  Sars covid 19 negative   Chest radiograph with hypoinflation, cardiomegaly, bilateral hilar vascular congestion, small left pleural effusion, pacemaker with one right atrial and one right ventricular lead. Defibrillator in place.   EKG 64 bpm, right axis deviation, normal intervals, ventricular paced rhythm with underlying atrial flutter, with no significant ST segment or T wave changes.   10/17 patient placed on IV furosemide, continue volume overloaded.  10/18 improved diuresis.  10/19 tolerating well IV furosemide, but continue volume overloaded.  10/21 volume status has improved. Consulting vascular surgery for fistula thrombosis.   Assessment and Plan: * Acute on chronic systolic CHF (congestive heart failure) (HCC) Echocardiogram  reduced LV systolic function with EF 20 to 25%, global hypokinesis, severe LVH, RV with severe reduction in systolic function, RV mild enlargement, RVSP 48,1 LA with mild dilatation, RA with moderate dilatation,   Urine output 2,025 cc Since admission he is 8,873 ml fluid balance negative.   Systolic blood pressure 108 to 118 mmHg.    10/17 metolazone 2.5 mg  Transition to oral torsemide tomorrow 40 mg po bid.  Holding on hydralazine to avoid hypotension.  Limited pharmacologic options due to acute worsening GFR.   Acute hypoxemic respiratory due to acute cardiogenic pulmonary edema.  V/Q scan with no evidence of pulmonary embolism.  Check ambulatory oxymetry on room air.  02 saturation at rest on room air 93%.   Cardiac amyloidosis, continue with tafamidis.   Acute renal failure superimposed on stage 4 chronic kidney disease (HCC) Today renal function with serum cr at 3,16 with K at 4,4 and serum bicarbonate at 27. Na 136   Patient had 80 mg IV of furosemide this morning.  Plan to transition to oral torsemide tomorrow 40 mg po bid.  Follow up renal function and electrolytes in am.    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 Plan is for left basilic vein versus more likely graft placement.  Follow up with vascular surgery.   Paroxysmal atrial fibrillation (HCC) Continue ventricular paced rhythm, on telemetry, underlying atrial fibrillation to atrial flutter. Positive PVC.  Marland Kitchen  12/28/22 CRT pacemaker implantation, BiV pacing.  Will continue telemetry monitoring.   Continue anticoagulation with apixaban.   Essential hypertension Continue to hold on hydralazine to avoid hypotension.  CAD (coronary artery disease) No chest pain, no acute coronary syndrome.   Non-insulin dependent type 2 diabetes mellitus (HCC) Hypoglycemia.   Fasting glucose this am 143 mg/dl.  Continue to hold on insulin therapy and continue capillary glucose monitoring.   Hypoglycemia protocol.    Hand and feet diabetic neuropathy, with pain that limits mobility Continue with gabapentin.   Right shoulder pain Radiograph with no fractures.  Positive degenerative changes, acromiohumeral abutment consistent with chronic rotator cuff arthropathy and likely degenerative rotator cuff tear.   Prostate cancer (HCC) Follow up as outpatient.         Subjective: Patient is feeling better, dyspnea continue to improve, no PND or orthopnea   Physical Exam: Vitals:   03/04/23 2316 03/05/23 0410 03/05/23 0533 03/05/23 0735  BP: 114/83 108/80  118/81  Pulse: 60 61  60  Resp: 19 19  17   Temp: 97.7 F (36.5 C) 97.8 F (36.6 C)  97.8 F (36.6 C)  TempSrc: Oral Oral  Oral  SpO2: 93% 92%  93%  Weight:   82 kg   Height:       Neurology awake and alert ENT with mild pallor Cardiovascular with S1 and S2 present and regular with no gallops, positive systolic murmur at the right lower sternal border Positive JVD Trace lower extremity edema Respiratory with no rales or wheezing, no rhonchi Abdomen with no distention  Data Reviewed:    Family Communication: no family at the bedside   Disposition: Status is: Inpatient Remains inpatient appropriate because: recovering from volume overload.   Planned Discharge Destination: Home    Author: Coralie Keens, MD 03/05/2023 8:59 AM  For on call review [email or web address].

## 2023-03-05 NOTE — Plan of Care (Signed)
  Problem: Clinical Measurements: Goal: Diagnostic test results will improve Outcome: Progressing Goal: Respiratory complications will improve Outcome: Progressing Goal: Cardiovascular complication will be avoided Outcome: Progressing   Problem: Activity: Goal: Risk for activity intolerance will decrease Outcome: Progressing   Problem: Nutrition: Goal: Adequate nutrition will be maintained Outcome: Progressing   Problem: Coping: Goal: Level of anxiety will decrease Outcome: Progressing   Problem: Pain Managment: Goal: General experience of comfort will improve Outcome: Progressing   Problem: Safety: Goal: Ability to remain free from injury will improve Outcome: Progressing   

## 2023-03-06 DIAGNOSIS — I5023 Acute on chronic systolic (congestive) heart failure: Secondary | ICD-10-CM | POA: Diagnosis not present

## 2023-03-06 DIAGNOSIS — I480 Paroxysmal atrial fibrillation: Secondary | ICD-10-CM | POA: Diagnosis not present

## 2023-03-06 DIAGNOSIS — N179 Acute kidney failure, unspecified: Secondary | ICD-10-CM | POA: Diagnosis not present

## 2023-03-06 DIAGNOSIS — I10 Essential (primary) hypertension: Secondary | ICD-10-CM | POA: Diagnosis not present

## 2023-03-06 LAB — BASIC METABOLIC PANEL
Anion gap: 10 (ref 5–15)
BUN: 87 mg/dL — ABNORMAL HIGH (ref 8–23)
CO2: 30 mmol/L (ref 22–32)
Calcium: 9.1 mg/dL (ref 8.9–10.3)
Chloride: 96 mmol/L — ABNORMAL LOW (ref 98–111)
Creatinine, Ser: 3.3 mg/dL — ABNORMAL HIGH (ref 0.61–1.24)
GFR, Estimated: 19 mL/min — ABNORMAL LOW (ref >60)
Glucose, Bld: 142 mg/dL — ABNORMAL HIGH (ref 70–99)
Potassium: 4.5 mmol/L (ref 3.5–5.1)
Sodium: 136 mmol/L (ref 135–145)

## 2023-03-06 LAB — GLUCOSE, CAPILLARY
Glucose-Capillary: 127 mg/dL — ABNORMAL HIGH (ref 70–99)
Glucose-Capillary: 133 mg/dL — ABNORMAL HIGH (ref 70–99)
Glucose-Capillary: 148 mg/dL — ABNORMAL HIGH (ref 70–99)
Glucose-Capillary: 154 mg/dL — ABNORMAL HIGH (ref 70–99)
Glucose-Capillary: 163 mg/dL — ABNORMAL HIGH (ref 70–99)
Glucose-Capillary: 169 mg/dL — ABNORMAL HIGH (ref 70–99)

## 2023-03-06 LAB — CBC
HCT: 36.1 % — ABNORMAL LOW (ref 39.0–52.0)
Hemoglobin: 10.9 g/dL — ABNORMAL LOW (ref 13.0–17.0)
MCH: 25.1 pg — ABNORMAL LOW (ref 26.0–34.0)
MCHC: 30.2 g/dL (ref 30.0–36.0)
MCV: 83.2 fL (ref 80.0–100.0)
Platelets: 162 10*3/uL (ref 150–400)
RBC: 4.34 MIL/uL (ref 4.22–5.81)
RDW: 19.6 % — ABNORMAL HIGH (ref 11.5–15.5)
WBC: 3.1 10*3/uL — ABNORMAL LOW (ref 4.0–10.5)
nRBC: 0 % (ref 0.0–0.2)

## 2023-03-06 MED ORDER — FUROSEMIDE 10 MG/ML IJ SOLN
INTRAMUSCULAR | Status: AC
  Filled 2023-03-06: qty 8

## 2023-03-06 MED ORDER — METOLAZONE 2.5 MG PO TABS
2.5000 mg | ORAL_TABLET | Freq: Once | ORAL | Status: AC
  Administered 2023-03-06: 2.5 mg via ORAL
  Filled 2023-03-06: qty 1

## 2023-03-06 MED ORDER — FUROSEMIDE 10 MG/ML IJ SOLN
80.0000 mg | Freq: Three times a day (TID) | INTRAMUSCULAR | Status: DC
  Administered 2023-03-06: 80 mg via INTRAVENOUS
  Filled 2023-03-06: qty 8

## 2023-03-06 MED ORDER — FUROSEMIDE 10 MG/ML IJ SOLN
120.0000 mg | Freq: Four times a day (QID) | INTRAVENOUS | Status: DC
  Administered 2023-03-06 – 2023-03-09 (×12): 120 mg via INTRAVENOUS
  Filled 2023-03-06 (×2): qty 10
  Filled 2023-03-06 (×2): qty 12
  Filled 2023-03-06 (×2): qty 10
  Filled 2023-03-06 (×2): qty 12
  Filled 2023-03-06 (×6): qty 10
  Filled 2023-03-06: qty 2
  Filled 2023-03-06: qty 10

## 2023-03-06 MED ORDER — MIDODRINE HCL 5 MG PO TABS
5.0000 mg | ORAL_TABLET | Freq: Three times a day (TID) | ORAL | Status: DC
  Administered 2023-03-06 – 2023-03-12 (×17): 5 mg via ORAL
  Filled 2023-03-06 (×17): qty 1

## 2023-03-06 NOTE — Consult Note (Signed)
Reason for Consult: Renal failure Referring Physician:  Dr. Ella Jubilee  Chief Complaint: Shortness of breath  Assessment/Plan: Renal failure with h/o CKDIV with a baseline creatinine in the 2.7-3.1 range and h/o failed left BCF (placed 01/31/22). Had been putting out usually >2L UOP/24hr range but over the past 24hrs that has dropped to 1.4L. Creatinine has slowly increasing over the past few days. Usually would hold diuretics when renal function trends the wrong direction but he is markedly volume overloaded. Weight was 144.8 lbs with minimal edema noted on 11/20/22. On 08/22/2022 he was 168lbs but noted to have 1+ edema in the lower ext at that time. Currently he is ~180lbs.  - He has no emergent need for HD however may be moving in that direction; still with fairly dense edema and clinically appears dyspneic. He states it's a little better but I wonder if that's because he wants to go home. His spouse was bedside and concerned as she notices the SOB as well. - Increase Lasix to 120mg  q6hrs; I also explained to the spouse and pt he may end up on dialysis but unfortunately he is markedly overloaded and needs to be diuresed. - Will also check a bladder PVR; I don't think he is retaining but would like to be complete  -Maintain MAP>65 for optimal renal perfusion.  - Avoid nephrotoxic medications including NSAIDs and iodinated intravenous contrast exposure unless the latter is absolutely indicated.   - Preferred narcotic agents for pain control are hydromorphone, fentanyl, and methadone. Morphine should not be used.  - Avoid Baclofen and avoid oral sodium phosphate and magnesium citrate based laxatives / bowel preps.  - Continue strict Input and Output monitoring. Will monitor the patient closely with you and intervene or adjust therapy as indicated by changes in clinical status/labs  CHF - still appears to be decompensated and needs to continue diuresis HTN Secondary hyperparathyroidism - will check a P and  iPTH Cardiac amyloidosis seen by cardiology clinic Proteinuria - secondary to DM vs. amyloid; subnephrotic - off of ARB and jardiance with advanced CKD and anticipate need for HD soon DM - per primary team    HPI: Corey [name] is an 77 y.o. male combined HFrEF EF 20-25%, DM, amyloidosis, pericardial window 06/2020, afib, CASHD, HTN, OSA, prostate cancer, seizure only after the CVA in 2014, gout CKD4 with a baseline cr between 2.7-3.1 followed by Dr. Vallery Sa now presenting with shortness of breath and worsening leg swelling for the past 7 days. He had a failed left BCF which is thrombosed and was sent from cardiology clinic on 10/16 because of worsening dyspnea, orthopnea having gained 10lbs of weight. He was noted to have rales bilaterally in the ED with CXR showing congestion. He was treated with Lasix 80mg  q8r + metolazone 2.5mg  on 10/17. He is overall neg 9.2L during this hospitalization.   He denies any NSAID's but does end up taking colchicine a couple of times a year.  He denies kidney stones.  Denies urinary hesitation.  He denies any known family history of CKD.  He was 144.8 lbs on 11/20/22 and 168 lbs on 08/22/22.  ROS Pertinent items are noted in HPI.  Chemistry and CBC: Creat  Date/Time Value Ref Range Status  01/28/2020 10:00 AM 1.69 (H) 0.70 - 1.18 mg/dL Final    Comment:    For patients >80 years of age, the reference limit for Creatinine is approximately 13% higher for people identified as African-American. .    Creatinine, Ser  Date/Time Value Ref Range Status  03/06/2023 02:32 AM 3.30 (H) 0.61 - 1.24 mg/dL Final  13/12/6576 46:96 AM 3.16 (H) 0.61 - 1.24 mg/dL Final  29/52/8413 24:40 AM 2.92 (H) 0.61 - 1.24 mg/dL Final  03/11/2535 64:40 AM 3.09 (H) 0.61 - 1.24 mg/dL Final  34/74/2595 63:87 PM 3.14 (H) 0.61 - 1.24 mg/dL Final  56/43/3295 18:84 AM 3.19 (H) 0.61 - 1.24 mg/dL Final  16/60/6301 60:10 AM 3.12 (H) 0.61 - 1.24 mg/dL Final  93/23/5573 22:02 PM 2.30 (H)  0.61 - 1.24 mg/dL Final  54/27/0623 76:28 PM 3.14 (H) 0.61 - 1.24 mg/dL Final  31/51/7616 07:37 AM 2.57 (H) 0.61 - 1.24 mg/dL Final  10/62/6948 54:62 PM 2.85 (H) 0.61 - 1.24 mg/dL Final  70/35/0093 81:82 PM 2.80 (H) 0.61 - 1.24 mg/dL Final  99/37/1696 78:93 PM 2.76 (H) 0.61 - 1.24 mg/dL Final  81/05/7508 25:85 AM 2.97 (H) 0.76 - 1.27 mg/dL Final  27/78/2423 53:61 AM 3.52 (H) 0.76 - 1.27 mg/dL Final  44/31/5400 86:76 AM 3.32 (H) 0.76 - 1.27 mg/dL Final  19/50/9326 71:24 PM 3.06 (H) 0.76 - 1.27 mg/dL Final  58/01/9832 82:50 AM 3.51 (H) 0.61 - 1.24 mg/dL Final  53/97/6734 19:37 AM 3.64 (H) 0.40 - 1.50 mg/dL Final  90/24/0973 53:29 AM 3.70 (H) 0.61 - 1.24 mg/dL Final  92/42/6834 19:62 AM 5.31 (H) 0.61 - 1.24 mg/dL Final  22/97/9892 11:94 AM 5.46 (H) 0.61 - 1.24 mg/dL Final  17/40/8144 81:85 AM 5.34 (H) 0.61 - 1.24 mg/dL Final  63/14/9702 63:78 AM 5.26 (H) 0.61 - 1.24 mg/dL Final  58/85/0277 41:28 AM 5.22 (H) 0.61 - 1.24 mg/dL Final  78/67/6720 94:70 AM 4.93 (H) 0.61 - 1.24 mg/dL Final  96/28/3662 94:76 PM 4.58 (H) 0.61 - 1.24 mg/dL Final  54/65/0354 65:68 AM 3.10 (H) 0.61 - 1.24 mg/dL Final  12/75/1700 17:49 AM 3.75 (H) 0.40 - 1.50 mg/dL Final  44/96/7591 63:84 AM 3.35 (H) 0.76 - 1.27 mg/dL Final  66/59/9357 01:77 PM 3.41 (H) 0.76 - 1.27 mg/dL Final  93/90/3009 23:30 AM 3.26 (H) 0.40 - 1.50 mg/dL Final  07/62/2633 35:45 AM 2.19 (H) 0.61 - 1.24 mg/dL Final  62/56/3893 73:42 AM 2.51 (H) 0.40 - 1.50 mg/dL Final  87/68/1157 26:20 AM 2.07 (H) 0.40 - 1.50 mg/dL Final  35/59/7416 38:45 AM 1.97 (H) 0.40 - 1.50 mg/dL Final  36/46/8032 12:24 AM 2.08 (H) 0.40 - 1.50 mg/dL Final  82/50/0370 48:88 AM 2.29 (H) 0.61 - 1.24 mg/dL Final  91/69/4503 88:82 AM 2.67 (H) 0.61 - 1.24 mg/dL Final  80/07/4915 91:50 AM 2.34 (H) 0.61 - 1.24 mg/dL Final  56/97/9480 16:55 AM 2.55 (H) 0.61 - 1.24 mg/dL Final  37/48/2707 86:75 AM 2.61 (H) 0.61 - 1.24 mg/dL Final  44/92/0100 71:21 AM 2.08 (H) 0.61 - 1.24 mg/dL Final   97/58/8325 49:82 AM 2.21 (H) 0.61 - 1.24 mg/dL Final  64/15/8309 40:76 AM 2.48 (H) 0.61 - 1.24 mg/dL Final  80/88/1103 15:94 AM 2.12 (H) 0.61 - 1.24 mg/dL Final  58/59/2924 46:28 AM 1.69 (H) 0.61 - 1.24 mg/dL Final  63/81/7711 65:79 AM 1.63 (H) 0.61 - 1.24 mg/dL Final  03/83/3383 29:19 AM 1.55 (H) 0.61 - 1.24 mg/dL Final  16/60/6004 59:97 PM 1.61 (H) 0.61 - 1.24 mg/dL Final  74/14/2395 32:02 AM 1.72 (H) 0.76 - 1.27 mg/dL Final  33/43/5686 16:83 AM 1.68 (H) 0.40 - 1.50 mg/dL Final   Recent Labs  Lab 02/28/23 0749 03/01/23 0221 03/02/23 1218 03/03/23 0216 03/04/23 0216 03/05/23 0219 03/06/23 0232  NA 139 138 138 137 136 136 136  K 4.2 4.6 4.7 4.7 4.4 4.4 4.5  CL 104 102 100 98 97* 97* 96*  CO2 23 26 25 25 26 27 30   GLUCOSE 112* 100* 135* 103* 85 143* 142*  BUN 72* 68* 69* 68* 73* 86* 87*  CREATININE 3.12* 3.19* 3.14* 3.09* 2.92* 3.16* 3.30*  CALCIUM 9.1 9.0 9.4 9.9 9.5 9.2 9.1   Recent Labs  Lab 02/27/23 1736 02/27/23 2248 02/28/23 0749 03/05/23 0219 03/06/23 0232  WBC 2.3*  --  2.4* 2.8* 3.1*  HGB 11.6* 10.5* 12.2* 11.2* 10.9*  HCT 38.1* 31.0* 40.0 36.9* 36.1*  MCV 83.7  --  83.0 82.6 83.2  PLT 140*  --  157 133* 162   Liver Function Tests: Recent Labs  Lab 02/27/23 2201 02/28/23 0749  AST 46* 39  ALT 19 17  ALKPHOS 98 88  BILITOT 1.7* 1.5*  PROT 7.4 6.5  ALBUMIN 3.0* 2.7*   No results for input(s): "LIPASE", "AMYLASE" in the last 168 hours. No results for input(s): "AMMONIA" in the last 168 hours. Cardiac Enzymes: No results for input(s): "CKTOTAL", "CKMB", "CKMBINDEX", "TROPONINI" in the last 168 hours. Iron Studies: No results for input(s): "IRON", "TIBC", "TRANSFERRIN", "FERRITIN" in the last 72 hours. PT/INR: @LABRCNTIP (inr:5)  Xrays/Other Studies: ) Results for orders placed or performed during the hospital encounter of 02/27/23 (from the past 48 hour(s))  Glucose, capillary     Status: Abnormal   Collection Time: 03/04/23  4:55 PM  Result  Value Ref Range   Glucose-Capillary 101 (H) 70 - 99 mg/dL    Comment: Glucose reference range applies only to samples taken after fasting for at least 8 hours.  Glucose, capillary     Status: Abnormal   Collection Time: 03/04/23  7:44 PM  Result Value Ref Range   Glucose-Capillary 168 (H) 70 - 99 mg/dL    Comment: Glucose reference range applies only to samples taken after fasting for at least 8 hours.   Comment 1 Notify RN   Glucose, capillary     Status: Abnormal   Collection Time: 03/04/23 11:17 PM  Result Value Ref Range   Glucose-Capillary 168 (H) 70 - 99 mg/dL    Comment: Glucose reference range applies only to samples taken after fasting for at least 8 hours.   Comment 1 Notify RN   CBC     Status: Abnormal   Collection Time: 03/05/23  2:19 AM  Result Value Ref Range   WBC 2.8 (L) 4.0 - 10.5 K/uL   RBC 4.47 4.22 - 5.81 MIL/uL   Hemoglobin 11.2 (L) 13.0 - 17.0 g/dL   HCT 16.1 (L) 09.6 - 04.5 %   MCV 82.6 80.0 - 100.0 fL   MCH 25.1 (L) 26.0 - 34.0 pg   MCHC 30.4 30.0 - 36.0 g/dL   RDW 40.9 (H) 81.1 - 91.4 %   Platelets 133 (L) 150 - 400 K/uL   nRBC 0.0 0.0 - 0.2 %    Comment: Performed at [facility], [address], [place], [place] [address]  Basic metabolic panel     Status: Abnormal   Collection Time: 03/05/23  2:19 AM  Result Value Ref Range   Sodium 136 135 - 145 mmol/L   Potassium 4.4 3.5 - 5.1 mmol/L   Chloride 97 (L) 98 - 111 mmol/L   CO2 27 22 - 32 mmol/L   Glucose, Bld 143 (H) 70 - 99 mg/dL    Comment: Glucose  reference range applies only to samples taken after fasting for at least 8 hours.   BUN 86 (H) 8 - 23 mg/dL   Creatinine, Ser 0.98 (H) 0.61 - 1.24 mg/dL   Calcium 9.2 8.9 - 11.9 mg/dL   GFR, Estimated 20 (L) >60 mL/min    Comment: (NOTE) Calculated using the CKD-EPI Creatinine Equation (2021)    Anion gap 12 5 - 15    Comment: Performed at [facility], [address], [place], [place] [address]  Glucose, capillary     Status: Abnormal    Collection Time: 03/05/23  4:09 AM  Result Value Ref Range   Glucose-Capillary 113 (H) 70 - 99 mg/dL    Comment: Glucose reference range applies only to samples taken after fasting for at least 8 hours.   Comment 1 Notify RN   Glucose, capillary     Status: Abnormal   Collection Time: 03/05/23  7:30 AM  Result Value Ref Range   Glucose-Capillary 187 (H) 70 - 99 mg/dL    Comment: Glucose reference range applies only to samples taken after fasting for at least 8 hours.   Comment 1 Notify RN    Comment 2 Document in Chart   Glucose, capillary     Status: Abnormal   Collection Time: 03/05/23 11:32 AM  Result Value Ref Range   Glucose-Capillary 204 (H) 70 - 99 mg/dL    Comment: Glucose reference range applies only to samples taken after fasting for at least 8 hours.  Glucose, capillary     Status: Abnormal   Collection Time: 03/05/23  4:09 PM  Result Value Ref Range   Glucose-Capillary 176 (H) 70 - 99 mg/dL    Comment: Glucose reference range applies only to samples taken after fasting for at least 8 hours.  Glucose, capillary     Status: Abnormal   Collection Time: 03/05/23  7:31 PM  Result Value Ref Range   Glucose-Capillary 176 (H) 70 - 99 mg/dL    Comment: Glucose reference range applies only to samples taken after fasting for at least 8 hours.  Glucose, capillary     Status: Abnormal   Collection Time: 03/05/23 11:37 PM  Result Value Ref Range   Glucose-Capillary 158 (H) 70 - 99 mg/dL    Comment: Glucose reference range applies only to samples taken after fasting for at least 8 hours.  CBC     Status: Abnormal   Collection Time: 03/06/23  2:32 AM  Result Value Ref Range   WBC 3.1 (L) 4.0 - 10.5 K/uL   RBC 4.34 4.22 - 5.81 MIL/uL   Hemoglobin 10.9 (L) 13.0 - 17.0 g/dL   HCT 95.6 (L) 21.3 - 08.6 %   MCV 83.2 80.0 - 100.0 fL   MCH 25.1 (L) 26.0 - 34.0 pg   MCHC 30.2 30.0 - 36.0 g/dL   RDW 57.8 (H) 46.9 - 62.9 %   Platelets 162 150 - 400 K/uL   nRBC 0.0 0.0 - 0.2 %     Comment: Performed at [facility], [address] [place], [place] [address]  Basic metabolic panel     Status: Abnormal   Collection Time: 03/06/23  2:32 AM  Result Value Ref Range   Sodium 136 135 - 145 mmol/L   Potassium 4.5 3.5 - 5.1 mmol/L   Chloride 96 (L) 98 - 111 mmol/L   CO2 30 22 - 32 mmol/L   Glucose, Bld 142 (H) 70 - 99 mg/dL  Comment: Glucose reference range applies only to samples taken after fasting for at least 8 hours.   BUN 87 (H) 8 - 23 mg/dL   Creatinine, Ser 1.61 (H) 0.61 - 1.24 mg/dL   Calcium 9.1 8.9 - 09.6 mg/dL   GFR, Estimated 19 (L) >60 mL/min    Comment: (NOTE) Calculated using the CKD-EPI Creatinine Equation (2021)    Anion gap 10 5 - 15    Comment: Performed at [facility], [address] [place], [place] [address]  Glucose, capillary     Status: Abnormal   Collection Time: 03/06/23  4:33 AM  Result Value Ref Range   Glucose-Capillary 127 (H) 70 - 99 mg/dL    Comment: Glucose reference range applies only to samples taken after fasting for at least 8 hours.  Glucose, capillary     Status: Abnormal   Collection Time: 03/06/23  8:08 AM  Result Value Ref Range   Glucose-Capillary 148 (H) 70 - 99 mg/dL    Comment: Glucose reference range applies only to samples taken after fasting for at least 8 hours.   *Note: Due to a large number of results and/or encounters for the requested time period, some results have not been displayed. A complete set of results can be found in Results Review.   No results found.  PMH:   Past Medical History:  Diagnosis Date   Cataract    CHF (congestive heart failure) (HCC)    Chronic heart failure with preserved ejection fraction (HFpEF) (HCC)    Colon polyp 2003   Dr Victorino Dike; F/U was to be 2008( not completed)   Coronary artery disease    CVA (cerebral infarction) 2014   Diabetes mellitus    Diverticulosis 2003   Dyspnea    with exertion   Gout    Hereditary cardiac amyloidosis (HCC)     Hypertension    Myocardial infarction (HCC) 09/19/2012   PEA cardiac arrest in setting of acute respiratory failure/pulmonary edema   OSA (obstructive sleep apnea) 03/27/2018   Pneumonia 09/29/2011   Avelox X 10 days as OP   Prostate cancer (HCC)    Renal insufficiency    Seizures (HCC) 09/19/2012   not treated for seizure disorder; had a seizure after stroke 2014; no seizure since then   Stroke [facility])     PSH:   Past Surgical History:  Procedure Laterality Date   A-FLUTTER ABLATION N/A 06/23/2022   Procedure: A-FLUTTER ABLATION;  Surgeon: Maurice Small, MD;  Location: [facility] INVASIVE CV LAB;  Service: Cardiovascular;  Laterality: N/A;   A/V FISTULAGRAM Left 04/28/2022   Procedure: A/V Fistulagram;  Surgeon: Chuck Hint, MD;  Location: [facility] INVASIVE CV LAB;  Service: Cardiovascular;  Laterality: Left;   AV FISTULA PLACEMENT Left 01/31/2022   Procedure: LEFT ARM ARTERIOVENOUS (AV) FISTULA CREATION;  Surgeon: Maeola Harman, MD;  Location: [facility] OR;  Service: Vascular;  Laterality: Left;  regional block to left arm   CARDIOVERSION N/A 03/24/2022   Procedure: CARDIOVERSION;  Surgeon: Dorthula Nettles, DO;  Location: [facility] ENDOSCOPY;  Service: Cardiovascular;  Laterality: N/A;   COLONOSCOPY W/ POLYPECTOMY  2003   no F/U (SOC discussed 12/03/12)   INGUINAL HERNIA REPAIR Left 06/16/2021   Procedure: LAPAROSCOPIC, POSSIBLY OPEN LEFT INGUINAL HERNIA REPAIR WITH MESH;  Surgeon: Quentin Ore, MD;  Location: [facility] ORS;  Service: General;  Laterality: Left;   INSERTION OF MESH N/A 07/16/2017   Procedure: INSERTION OF MESH;  Surgeon: Berna Bue, MD;  Location: [facility] OR;  Service: General;  Laterality: N/A;   PACEMAKER IMPLANT N/A 12/28/2022   Procedure: PACEMAKER IMPLANT;  Surgeon: Maurice Small, MD;  Location: [facility] INVASIVE CV LAB;  Service: Cardiovascular;  Laterality: N/A;   PEG PLACEMENT  2014   10/08/12-12/10/12   PEG TUBE REMOVAL  2014   PERIPHERAL VASCULAR BALLOON  ANGIOPLASTY  04/28/2022   Procedure: PERIPHERAL VASCULAR BALLOON ANGIOPLASTY;  Surgeon: Chuck Hint, MD;  Location: [facility] INVASIVE CV LAB;  Service: Cardiovascular;;   PROSTATE BIOPSY     RIGHT HEART CATH N/A 03/24/2022   Procedure: RIGHT HEART CATH;  Surgeon: Dorthula Nettles, DO;  Location: [facility] INVASIVE CV LAB;  Service: Cardiovascular;  Laterality: N/A;   RIGHT/LEFT HEART CATH AND CORONARY ANGIOGRAPHY N/A 09/20/2020   Procedure: RIGHT/LEFT HEART CATH AND CORONARY ANGIOGRAPHY;  Surgeon: Marykay Lex, MD;  Location: [facility] INVASIVE CV LAB;  Service: Cardiovascular;  Laterality: N/A;   SUBQ ICD IMPLANT N/A 09/04/2022   Procedure: SUBQ ICD IMPLANT;  Surgeon: Maurice Small, MD;  Location: [facility] INVASIVE CV LAB;  Service: Cardiovascular;  Laterality: N/A;   TEE WITHOUT CARDIOVERSION N/A 09/27/2020   Procedure: TRANSESOPHAGEAL ECHOCARDIOGRAM (TEE);  Surgeon: Linden Dolin, MD;  Location: [facility] OR;  Service: Thoracic;  Laterality: N/A;   TEE WITHOUT CARDIOVERSION N/A 03/24/2022   Procedure: TRANSESOPHAGEAL ECHOCARDIOGRAM (TEE);  Surgeon: Dorthula Nettles, DO;  Location: [facility] ENDOSCOPY;  Service: Cardiovascular;  Laterality: N/A;   TEE WITHOUT CARDIOVERSION N/A 06/23/2022   Procedure: TRANSESOPHAGEAL ECHOCARDIOGRAM (TEE);  Surgeon: Maurice Small, MD;  Location: [facility] INVASIVE CV LAB;  Service: Cardiovascular;  Laterality: N/A;   TRACHEOSTOMY  2014   09/30/12-10/20/12   VENTRAL HERNIA REPAIR N/A 07/16/2017   Procedure: LAPAROSCOPIC VENTRAL HERNIA REPAIR WITH MESH;  Surgeon: Berna Bue, MD;  Location: [facility] OR;  Service: General;  Laterality: N/A;   wrist aspiration  02/16/2012    monosodium urate crystals; Dr Melvyn Novas    Allergies:  Allergies  Allergen Reactions   Hydrochlorothiazide Other (See Comments)    Gout , uncontrolled diabetes and renal insufficiency    Medications:   Prior to Admission medications   Medication Sig Start Date End Date Taking? Authorizing Provider   acetaminophen (TYLENOL) 500 MG tablet Take 1,000 mg by mouth every 8 (eight) hours as needed for moderate pain.   Yes [provider]  albuterol (VENTOLIN HFA) 108 (90 Base) MCG/ACT inhaler Inhale 2 puffs into the lungs every 4 (four) hours as needed for wheezing or shortness of breath. 11/14/21  Yes Valinda Hoar, NP  allopurinol (ZYLOPRIM) 100 MG tablet Take 1/2 (one-half) tablet by mouth once daily 01/18/23  Yes Burns, [name], MD  apixaban (ELIQUIS) 5 MG TABS tablet Take 1 tablet by mouth twice daily 01/29/23  Yes Mealor, Roberts Gaudy, MD  bismuth subsalicylate (PEPTO BISMOL) 262 MG/15ML suspension Take 30 mLs by mouth every 6 (six) hours as needed for indigestion or diarrhea or loose stools.   Yes [provider]  cholecalciferol (VITAMIN D3) 25 MCG (1000 UNIT) tablet Take 1,000 Units by mouth daily.   Yes [provider]  colchicine (COLCRYS) 0.6 MG tablet Take 2 tabs once and then one tab one hour later as needed for gout Patient taking differently: Take 0.6 mg by mouth as needed (gout). 07/30/20  Yes Burns, [name], MD  hydrALAZINE (APRESOLINE) 25 MG tablet TAKE 1 TABLET THREE TIMES DAILY 12/07/22  Yes Burns, [name], MD  rosuvastatin (CRESTOR) 5 MG tablet Take 1 tablet (  5 mg total) by mouth daily. 06/26/22  Yes Mealor, Roberts Gaudy, MD  sennosides-docusate sodium (SENOKOT-S) 8.6-50 MG tablet Take 2 tablets by mouth daily as needed for constipation.   Yes [provider]  sitaGLIPtin (JANUVIA) 50 MG tablet Take 0.5 tablets (25 mg total) by mouth daily. 01/02/23  Yes Reather Littler, MD  sodium bicarbonate 650 MG tablet TAKE 1 TABLET BY MOUTH TWICE DAILY . APPOINTMENT REQUIRED FOR FUTURE REFILLS 01/18/23  Yes Burns, [name], MD  Tafamidis [facility]) 61 MG CAPS Take 1 capsule (61 mg total) by mouth daily. 06/19/22  Yes Jake Bathe, MD  torsemide (DEMADEX) 20 MG tablet Take 20 mg by mouth 2 (two) times daily.   Yes [provider]  glucose blood (ACCU-CHEK AVIVA  PLUS) test strip Use to check blood glucose 4 times a day. 12/25/22   Reather Littler, MD  TRUEplus Lancets 33G MISC USE 4 (FOUR) TIMES DAILY 04/04/21   Reather Littler, MD    Discontinued Meds:   Medications Discontinued During This Encounter  Medication Reason   ceFAZolin (ANCEF) IVPB 2g/100 mL premix    chlorhexidine (HIBICLENS) 4 % liquid 4 Application    chlorhexidine (HIBICLENS) 4 % liquid 4 Application    0.9 %  sodium chloride infusion    heparin ADULT infusion 100 units/mL (25000 units/233mL)    furosemide (LASIX) injection 80 mg    hydrALAZINE (APRESOLINE) tablet 25 mg    sodium bicarbonate tablet 650 mg    Tafamidis CAPS 61 mg    furosemide (LASIX) injection 80 mg    torsemide (DEMADEX) tablet 40 mg     Social History:  reports that he quit smoking about 54 years ago. His smoking use included cigarettes. He started smoking about 55 years ago. He has a 0.3 pack-year smoking history. He has never used smokeless tobacco. He reports that he does not drink alcohol and does not use drugs.  Family History:   Family History  Problem Relation Age of Onset   Heart failure Mother    Hypertension Mother    Diabetes Mother    Hypertension Father    Prostate cancer Father    Diabetes Father    Cancer Father    Heart failure Brother    Stroke Neg Hx    Heart disease Neg Hx    Colon cancer Neg Hx     Blood pressure 112/67, pulse 62, temperature 98.8 F (37.1 C), temperature source Oral, resp. rate (!) 24, height 5\' 6"  (1.676 m), weight 82.9 kg, SpO2 100%. Gen - Adult male seated in NAD; mild labored breathing with incr RR and some SOB speaking in full sentences HEENT - NCAT/EOMI; sclera anicteric Neck - supple Cardiovascular - S1S2; no rub; edema bilateral to thighs, PM/Defib right chest wall Chest/Lungs - clear to auscultation Abdomen - soft/NT/ND MSK grossly normal bulk and tone Neuro no focal deficits on gross exam; ambulates without assistance Psych normal mood and affect Skin  - no rash; no lesions on exposed areas Access: LUE AVF no bruit or thrill       Cortnee Steinmiller, Len Blalock, MD 03/06/2023, 12:07 PM

## 2023-03-06 NOTE — Plan of Care (Signed)
  Problem: Activity: Goal: Ability to return to baseline activity level will improve Outcome: Progressing   Problem: Activity: Goal: Ability to return to baseline activity level will improve Outcome: Progressing   Problem: Clinical Measurements: Goal: Will remain free from infection Outcome: Progressing Goal: Respiratory complications will improve Outcome: Progressing   Problem: Activity: Goal: Risk for activity intolerance will decrease Outcome: Progressing   Problem: Nutrition: Goal: Adequate nutrition will be maintained Outcome: Progressing   Problem: Coping: Goal: Level of anxiety will decrease Outcome: Progressing   Problem: Elimination: Goal: Will not experience complications related to bowel motility Outcome: Progressing Goal: Will not experience complications related to urinary retention Outcome: Progressing   Problem: Pain Managment: Goal: General experience of comfort will improve Outcome: Progressing   Problem: Safety: Goal: Ability to remain free from injury will improve Outcome: Progressing   Problem: Skin Integrity: Goal: Risk for impaired skin integrity will decrease Outcome: Progressing

## 2023-03-06 NOTE — Progress Notes (Signed)
PROGRESS NOTE    Corey [name]  BJY:[id number] DOB: [birth year] DOA: 02/27/2023 PCP: Pincus Sanes, MD  76/M w cardiac amyloidosis, heart failure, pericardial effusion (sp pericardial window), atrial fibrillation, CVA, CKD stage IV, T2DM and prior cardiac arrest (PEA), who presented with lower extremity edema and dyspnea for about 2 weeks, associated with orthopnea, cough, and 10 lbs weight gain. On the day of hospitalization he was evaluated at the cardiology clinic, pre op evaluation for HD access, he was found volume overloaded and was referred to the ED for further evaluation.  Na 141, bun 73, cr 3,14, hgb 11,6 plt 140  CXR w/ hypoinflation, cardiomegaly, bilateral hilar vascular congestion, small left pleural effusion, pacemaker with one right atrial and one right ventricular lead.   10/17 patient placed on IV furosemide, continue volume overloaded.  10/18 improved diuresis.  10/19 tolerating well IV furosemide, but continue volume overloaded.  10/21 volume status has improved. Vascular surgery recommended follow up as outpatient for AV fistula.  10/22 nephrology consulted, Lasix increased to 120 Mg every 6    Subjective: Complains of shakes, worried about his balance and strength  Assessment and Plan:  Acute on chronic systolic CHF (congestive heart failure) (HCC) -Echo with EF 20 to 25%, global hypokinesis, severe LVH, RV with severe reduction in systolic function, RA with moderate dilatation,  - diuresed with IV Lasix, now dose increased, appreciate nephrology input, continue Lasix 120 Mg Q6 -He is 11.4 L negative, creatinine improving, 2.8 now, baseline from 2.8-3.3 -Continue midodrine -GDMT limited by CKD 4 -CIR eval ongoing  Acute hypoxemic respiratory due to acute cardiogenic pulmonary edema.  -V/Q scan with no evidence of pulmonary embolism.  -Cardiac amyloidosis, continue with tafamidis.   Acute renal failure superimposed on stage 4 chronic kidney disease (HCC) -Baseline  creatinine 2.8-3.3, creatinine peaked at 3.3, now improving -Appreciate nephrology input, Lasix as above -Midodrine, avoid hypotension -Has thrombosed left arm AV fistula, Dr. Ella Jubilee discussed with vascular surgery, they recommended outpatient follow-up as no plans for urgent renal replacement therapy at this time  Anemia of chronic renal disease, with hgb at 10,9 -Stable, has mild pancytopenia as well could be secondary to amyloidosis/tafamidis  Paroxysmal atrial fibrillation (HCC) 12/28/22 CRT pacemaker implantation, BiV pacing - anticoagulation with apixaban.   Essential hypertension -Midodrine and Lasix as above  CAD (coronary artery disease) No chest pain, no acute coronary syndrome.   Non-insulin dependent type 2 diabetes mellitus (HCC) Hypoglycemia.  - hold on insulin therapy and continue capillary glucose monitoring.   Peripheral neuropathy Continue gabapentin, dose decreased  Right shoulder pain Radiograph with no fractures.  Positive degenerative changes, acromiohumeral abutment consistent with chronic rotator cuff arthropathy and likely degenerative rotator cuff tear.   Prostate cancer (HCC) Follow up as outpatient.   DVT prophylaxis: apixaban Code Status: Full Code Family Communication: No family at bedside Disposition Plan: Eval for CIR  Consultants:    Procedures:   Antimicrobials:    Objective: Vitals:   03/06/23 0434 03/06/23 0645 03/06/23 0718 03/06/23 1211  BP: 116/77  112/67 112/68  Pulse: 61  62 64  Resp: 20  (!) 24 20  Temp: 99.4 F (37.4 C)  98.8 F (37.1 C) 98.8 F (37.1 C)  TempSrc: Axillary  Oral Oral  SpO2: 96%  100% (!) 88%  Weight:  82.9 kg    Height:        Intake/Output Summary (Last 24 hours) at 03/06/2023 1219 Last data filed at 03/06/2023 0718 Gross per 24 hour  Intake 840 ml  Output 1000 ml  Net -160 ml   Filed Weights   03/04/23 0355 03/05/23 0533 03/06/23 0645  Weight: 76.4 kg 82 kg 82.9 kg     Examination:      Data Reviewed:   CBC: Recent Labs  Lab 02/27/23 1736 02/27/23 2248 02/28/23 0749 03/05/23 0219 03/06/23 0232  WBC 2.3*  --  2.4* 2.8* 3.1*  HGB 11.6* 10.5* 12.2* 11.2* 10.9*  HCT 38.1* 31.0* 40.0 36.9* 36.1*  MCV 83.7  --  83.0 82.6 83.2  PLT 140*  --  157 133* 162   Basic Metabolic Panel: Recent Labs  Lab 03/01/23 0221 03/02/23 1218 03/03/23 0216 03/04/23 0216 03/05/23 0219 03/06/23 0232  NA 138 138 137 136 136 136  K 4.6 4.7 4.7 4.4 4.4 4.5  CL 102 100 98 97* 97* 96*  CO2 26 25 25 26 27 30   GLUCOSE 100* 135* 103* 85 143* 142*  BUN 68* 69* 68* 73* 86* 87*  CREATININE 3.19* 3.14* 3.09* 2.92* 3.16* 3.30*  CALCIUM 9.0 9.4 9.9 9.5 9.2 9.1  MG 2.7* 2.7* 2.7*  --   --   --    GFR: Estimated Creatinine Clearance: 19.8 mL/min (A) (by C-G formula based on SCr of 3.3 mg/dL (H)). Liver Function Tests: Recent Labs  Lab 02/27/23 2201 02/28/23 0749  AST 46* 39  ALT 19 17  ALKPHOS 98 88  BILITOT 1.7* 1.5*  PROT 7.4 6.5  ALBUMIN 3.0* 2.7*   No results for input(s): "LIPASE", "AMYLASE" in the last 168 hours. No results for input(s): "AMMONIA" in the last 168 hours. Coagulation Profile: No results for input(s): "INR", "PROTIME" in the last 168 hours. Cardiac Enzymes: No results for input(s): "CKTOTAL", "CKMB", "CKMBINDEX", "TROPONINI" in the last 168 hours. BNP (last 3 results) No results for input(s): "PROBNP" in the last 8760 hours. HbA1C: No results for input(s): "HGBA1C" in the last 72 hours. CBG: Recent Labs  Lab 03/05/23 1609 03/05/23 1931 03/05/23 2337 03/06/23 0433 03/06/23 0808  GLUCAP 176* 176* 158* 127* 148*   Lipid Profile: No results for input(s): "CHOL", "HDL", "LDLCALC", "TRIG", "CHOLHDL", "LDLDIRECT" in the last 72 hours. Thyroid Function Tests: No results for input(s): "TSH", "T4TOTAL", "FREET4", "T3FREE", "THYROIDAB" in the last 72 hours. Anemia Panel: No results for input(s): "VITAMINB12", "FOLATE", "FERRITIN",  "TIBC", "IRON", "RETICCTPCT" in the last 72 hours. Urine analysis:    Component Value Date/Time   COLORURINE YELLOW 09/25/2020 2131   APPEARANCEUR CLEAR 09/25/2020 2131   LABSPEC 1.022 09/25/2020 2131   PHURINE 5.0 09/25/2020 2131   GLUCOSEU >=500 (A) 09/25/2020 2131   GLUCOSEU NEGATIVE 07/20/2014 1055   HGBUR NEGATIVE 09/25/2020 2131   HGBUR negative 08/28/2008 1446   BILIRUBINUR NEGATIVE 09/25/2020 2131   BILIRUBINUR Neg 10/28/2014 0916   KETONESUR NEGATIVE 09/25/2020 2131   PROTEINUR NEGATIVE 09/25/2020 2131   UROBILINOGEN 0.2 10/28/2014 0916   UROBILINOGEN 0.2 07/20/2014 1055   NITRITE NEGATIVE 09/25/2020 2131   LEUKOCYTESUR SMALL (A) 09/25/2020 2131   Sepsis Labs: @LABRCNTIP (procalcitonin:4,lacticidven:4)  ) Recent Results (from the past 240 hour(s))  Resp panel by RT-PCR (RSV, Flu A&B, Covid) Anterior Nasal Swab     Status: None   Collection Time: 02/27/23  5:30 PM   Specimen: Anterior Nasal Swab  Result Value Ref Range Status   SARS Coronavirus 2 by RT PCR NEGATIVE NEGATIVE Final   Influenza A by PCR NEGATIVE NEGATIVE Final   Influenza B by PCR NEGATIVE NEGATIVE Final    Comment: (NOTE) The Xpert  Xpress SARS-CoV-2/FLU/RSV plus assay is intended as an aid in the diagnosis of influenza from Nasopharyngeal swab specimens and should not be used as a sole basis for treatment. Nasal washings and aspirates are unacceptable for Xpert Xpress SARS-CoV-2/FLU/RSV testing.  Fact Sheet for Patients: BloggerCourse.com  Fact Sheet for Healthcare Providers: SeriousBroker.it  This test is not yet approved or cleared by the [place] FDA and has been authorized for detection and/or diagnosis of SARS-CoV-2 by FDA under an Emergency Use Authorization (EUA). This EUA will remain in effect (meaning this test can be used) for the duration of the COVID-19 declaration under Section 564(b)(1) of the Act, 21 U.S.C. section  360bbb-3(b)(1), unless the authorization is terminated or revoked.     Resp Syncytial Virus by PCR NEGATIVE NEGATIVE Final    Comment: (NOTE) Fact Sheet for Patients: BloggerCourse.com  Fact Sheet for Healthcare Providers: SeriousBroker.it  This test is not yet approved or cleared by the [place] FDA and has been authorized for detection and/or diagnosis of SARS-CoV-2 by FDA under an Emergency Use Authorization (EUA). This EUA will remain in effect (meaning this test can be used) for the duration of the COVID-19 declaration under Section 564(b)(1) of the Act, 21 U.S.C. section 360bbb-3(b)(1), unless the authorization is terminated or revoked.  Performed at [facility], [address], [place], [place] [address]   MRSA Next Gen by PCR, Nasal     Status: None   Collection Time: 02/28/23  1:42 AM   Specimen: Nasal Mucosa; Nasal Swab  Result Value Ref Range Status   MRSA by PCR Next Gen NOT DETECTED NOT DETECTED Final    Comment: (NOTE) The GeneXpert MRSA Assay (FDA approved for NASAL specimens only), is one component of a comprehensive MRSA colonization surveillance program. It is not intended to diagnose MRSA infection nor to guide or monitor treatment for MRSA infections. Test performance is not FDA approved in patients less than 46 years old. Performed at [facility], [address], [place], [place] [address]      Radiology Studies: No results found.   Scheduled Meds:  allopurinol  50 mg Oral Daily   apixaban  5 mg Oral BID   feeding supplement  237 mL Oral BID BM   furosemide       furosemide  80 mg Intravenous Q8H   gabapentin  100 mg Oral TID   influenza vaccine adjuvanted  0.5 mL Intramuscular Tomorrow-1000   midodrine  5 mg Oral TID WC   polyethylene glycol  17 g Oral Daily   rosuvastatin  5 mg Oral Daily   Tafamidis  61 mg Oral Daily   Continuous Infusions:   LOS: 6 days    Time  spent: 35mi     Zannie Cove, MD [facility]   03/06/2023, 12:19 PM

## 2023-03-06 NOTE — Progress Notes (Addendum)
Progress Note   Patient: Corey [name] OZH:[id number] DOB: 1946-03-11 DOA: 02/27/2023     6 DOS: the patient was seen and examined on 03/06/2023   Brief hospital course: Mr. Flavel was admitted to the hospital with the working diagnosis of heart failure exacerbation, volume overload in the setting of CKD stage 4.   77 yo male with the past medical history of cardiac amyloidosis, heart failure, pericardial effusion (sp pericardial window), atrial fibrillation, CVA, CKD stage IV, T2DM and prior cardiac arrest (PEA), who presented with dyspnea. Reported lower extremity edema and dyspnea for about 2 weeks, associated with orthopnea, cough, and 10 lbs weight gain. On the day of hospitalization he was evaluated at the cardiology clinic, pre op evaluation for HD access, he was found volume overloaded and was referred to the ED for further evaluation. On his initial physical examination his blood pressure was 139/102, HR 71, RR 29 and 02 saturation 96%, lungs with bilateral rales with no wheezing, heart with S1 and S2 present and regular, abdomen soft and positive lower extremity edema.   Na 141, K 4,3 CL 105, bicarbonate 26, glucose 83 bun 73, cr 3,14 AST 46, ALT 19, total bilirubin 1,7  Wbc 2,3 hgb 11,6 plt 140  Sars covid 19 negative   Chest radiograph with hypoinflation, cardiomegaly, bilateral hilar vascular congestion, small left pleural effusion, pacemaker with one right atrial and one right ventricular lead. Defibrillator in place.   EKG 64 bpm, right axis deviation, normal intervals, ventricular paced rhythm with underlying atrial flutter, with no significant ST segment or T wave changes.   10/17 patient placed on IV furosemide, continue volume overloaded.  10/18 improved diuresis.  10/19 tolerating well IV furosemide, but continue volume overloaded.  10/21 volume status has improved. Vascular surgery recommended follow up as outpatient for AV fistula.  10/22 diuresing but now rising serum cr,  will consult nephrology for further assistance in diuresis.   Assessment and Plan: * Acute on chronic systolic CHF (congestive heart failure) (HCC) Echocardiogram reduced LV systolic function with EF 20 to 25%, global hypokinesis, severe LVH, RV with severe reduction in systolic function, RV mild enlargement, RVSP 48,1 LA with mild dilatation, RA with moderate dilatation,   Urine output 1,400  cc Since admission he is 9,193 ml fluid balance negative.   Systolic blood pressure 116 to 112 mmHg.   Patient has been on loop diuretic since admission.   10/17 metolazone 2.5 mg   Patient did not respond to transition to oral loop diuretic, he continue volume overloaded and had decreases urine output.  Will plan to change back to furosemide IV 80 mg q 8 hrs. Re dose metolazone 2.5 mg today.  Add midodrine to avoid hypotension.   Limited pharmacologic options due to acute worsening GFR.   Acute hypoxemic respiratory due to acute cardiogenic pulmonary edema.   V/Q scan with no evidence of pulmonary embolism.  02 saturation at rest 95% on room air, using 2 L of supplemental 02 for ambulation.   Cardiac amyloidosis, continue with tafamidis.   Acute renal failure superimposed on stage 4 chronic kidney disease (HCC) Continue rising serum cr today is 3.3 with K at 4.5 and serum bicarbonate at 30. Na 136.  Continue volume overloaded and his urine output decreased with oral loop diuretic.  Will transition back to IV furosemide 80 mg IV q 8 hrs and will re dose metolazone 2.5 mg today. Add midodrine to avoid hypotension. Consult nephrology for further recommendations.  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 Plan is for left basilic vein versus more likely graft placement.  I spoke with vascular surgery for AV fistula, recommended follow up as outpatient as long as he dose not need urgent renal replacement therapy. (Note patient is on apixaban).   Anemia of  chronic renal disease, with hgb at 10,9 Positive leukopenia with wbc 3.1   Paroxysmal atrial fibrillation (HCC) 12/28/22 CRT pacemaker implantation, BiV pacing  Telemetry continue ventricular paced rhythm, rate 60 bpm with underlying atrial fibrillation to atrial flutter. Positive PVC.   Will continue telemetry monitoring.   Continue anticoagulation with apixaban.   Essential hypertension Add midodrine for blood pressure support. Aggressive diuresis.   CAD (coronary artery disease) No chest pain, no acute coronary syndrome.   Non-insulin dependent type 2 diabetes mellitus (HCC) Hypoglycemia.   Fasting glucose this am 142 mg/dl.  Continue to hold on insulin therapy and continue capillary glucose monitoring.  Hypoglycemia protocol.    Hand and feet diabetic neuropathy, with pain that limits mobility Continue with gabapentin.   Right shoulder pain Radiograph with no fractures.  Positive degenerative changes, acromiohumeral abutment consistent with chronic rotator cuff arthropathy and likely degenerative rotator cuff tear.   Prostate cancer (HCC) Follow up as outpatient.    Subjective: Patient with improvement in dyspnea but not back to baseline, no chest pain, no abdominal pain. Has feet pain that limits his ambulation. Bilateral hand paresthesias.   Physical Exam: Vitals:   03/05/23 2335 03/06/23 0434 03/06/23 0645 03/06/23 0718  BP: 135/85 116/77  112/67  Pulse: 70 61  62  Resp: (!) 23 20  (!) 24  Temp: 98.7 F (37.1 C) 99.4 F (37.4 C)  98.8 F (37.1 C)  TempSrc: Oral Axillary  Oral  SpO2: 91% 96%  100%  Weight:   82.9 kg   Height:       Neurology awake and alert ENT with mild pallor Cardiovascular with S1 and S2 present and regular with no gallops, positive systolic murmur at the right lower sternal border Positive JVD Positive lower extremity edema ++ Respiratory with rales at bases with no wheezing, no rhonchi Abdomen with no distention  Data  Reviewed:    Family Communication: no family at the bedside   Disposition: Status is: Inpatient Remains inpatient appropriate because: IV diuresis   Planned Discharge Destination: Rehab     Author: Coralie Keens, MD 03/06/2023 11:24 AM  For on call review [email or web address].

## 2023-03-06 NOTE — Progress Notes (Signed)
Mobility Specialist Progress Note:   03/06/23 1000  Mobility  Activity Transferred from bed to chair (Simultaneous filing. User may not have seen previous data.)  Level of Assistance Moderate assist, patient does 50-74%  Assistive Device Front wheel walker  Distance Ambulated (ft) 4 ft  Activity Response Tolerated well  Mobility Referral Yes  $Mobility charge 1 Mobility  Mobility Specialist Start Time (ACUTE ONLY) 1034  Mobility Specialist Stop Time (ACUTE ONLY) 1044  Mobility Specialist Time Calculation (min) (ACUTE ONLY) 10 min    Pre Mobility: 62 HR,  95% SpO2 Post Mobility:  75 HR,  94% SpO2  Pt received in bed, agreeable to mobility. Pt had difficulty keeping hands on walker and knees were buckling today. Required verbal cues and modA. No complaints throughout. Pt left in chair with call bell and all needs met. Chair alarm on.  D'Vante Earlene Plater Mobility Specialist Please contact via Special educational needs teacher or Rehab office at [phone]

## 2023-03-06 NOTE — Progress Notes (Signed)
° °  Inpatient Rehab Admissions Coordinator : ° °Per therapy change in recommendations, patient was screened for CIR candidacy by Cane Dubray RN MSN.  At this time patient appears to be a potential candidate for CIR. I will place a rehab consult per protocol for full assessment. Please call me with any questions. ° °Jerami Tammen RN MSN °Admissions Coordinator °[phone] °  °

## 2023-03-07 DIAGNOSIS — I5023 Acute on chronic systolic (congestive) heart failure: Secondary | ICD-10-CM | POA: Diagnosis not present

## 2023-03-07 LAB — RENAL FUNCTION PANEL
Albumin: 2.4 g/dL — ABNORMAL LOW (ref 3.5–5.0)
Anion gap: 11 (ref 5–15)
BUN: 89 mg/dL — ABNORMAL HIGH (ref 8–23)
CO2: 29 mmol/L (ref 22–32)
Calcium: 8.9 mg/dL (ref 8.9–10.3)
Chloride: 93 mmol/L — ABNORMAL LOW (ref 98–111)
Creatinine, Ser: 2.89 mg/dL — ABNORMAL HIGH (ref 0.61–1.24)
GFR, Estimated: 22 mL/min — ABNORMAL LOW (ref >60)
Glucose, Bld: 104 mg/dL — ABNORMAL HIGH (ref 70–99)
Phosphorus: 3.3 mg/dL (ref 2.5–4.6)
Potassium: 4.1 mmol/L (ref 3.5–5.1)
Sodium: 133 mmol/L — ABNORMAL LOW (ref 135–145)

## 2023-03-07 LAB — GLUCOSE, CAPILLARY
Glucose-Capillary: 88 mg/dL (ref 70–99)
Glucose-Capillary: 144 mg/dL — ABNORMAL HIGH (ref 70–99)
Glucose-Capillary: 111 mg/dL — ABNORMAL HIGH (ref 70–99)
Glucose-Capillary: 163 mg/dL — ABNORMAL HIGH (ref 70–99)
Glucose-Capillary: 168 mg/dL — ABNORMAL HIGH (ref 70–99)

## 2023-03-07 LAB — AMMONIA: Ammonia: 53 umol/L — ABNORMAL HIGH (ref 9–35)

## 2023-03-07 LAB — MAGNESIUM: Magnesium: 2.7 mg/dL — ABNORMAL HIGH (ref 1.7–2.4)

## 2023-03-07 MED ORDER — GABAPENTIN 100 MG PO CAPS
100.0000 mg | ORAL_CAPSULE | Freq: Every day | ORAL | Status: DC
  Administered 2023-03-07 – 2023-03-09 (×3): 100 mg via ORAL
  Filled 2023-03-07 (×3): qty 1

## 2023-03-07 NOTE — Plan of Care (Signed)
  Problem: Cardiac: Goal: Ability to achieve and maintain adequate cardiopulmonary perfusion will improve Outcome: Progressing   Problem: Clinical Measurements: Goal: Respiratory complications will improve Outcome: Progressing Goal: Cardiovascular complication will be avoided Outcome: Progressing   Problem: Nutrition: Goal: Adequate nutrition will be maintained Outcome: Progressing   Problem: Coping: Goal: Level of anxiety will decrease Outcome: Progressing   Problem: Pain Managment: Goal: General experience of comfort will improve Outcome: Progressing   Problem: Safety: Goal: Ability to remain free from injury will improve Outcome: Progressing

## 2023-03-07 NOTE — Progress Notes (Signed)
Occupational Therapy Treatment Patient Details Name: Corey [name] MRN: [id number] DOB: 04-Dec-1945 Today's Date: 03/07/2023   History of present illness Pt is a 77 y.o. male presenting 10/15 from cardiologist office with distended abdomen, BLE swelling and weeping. CXR showing mild pulmonary interstitial edema and L>R pleural effusions with adjacent airspace opacity which may reflect atelectasis or infection. Pt also with R shoulder pain post recent fall at home with radiograph with no fractures but positive for degenerative changes and consistent with chronic rotator cuff arthropathy and likely degenerative rotator cuff tear. PMH significant for cardiac amyloidosis (ATTR), HFrEF, PEA arrest, pericardial effusion, hypertension, hyperlipidemia, CKD stage IV, DMII, CVA, A-fib/flutter s/p CTI ablation March 2024 on Eliquis, s/p CRT placement August 2024, gout, prostate cancer, seizures.   OT comments  Pt with slow progress toward established OT goals secondary to continued BUE and LE weakness. Pt needing min A for rise and up to mod A initially for gaining balance during STS transfers this session. Needing significant assistance to maintain grasp on RW in standing; Session focused on transfers and BUE/LE strength. Pt ultimately transferring to chair with mod A face to face transfer and performing grooming from chair level with intermittent standing to access sink. Updated discharge recommendation to AIR to optimize rehab potential and decrease burden of care.       If plan is discharge home, recommend the following:  A little help with walking and/or transfers;A lot of help with bathing/dressing/bathroom;Assistance with cooking/housework;Assist for transportation;Help with stairs or ramp for entrance;A lot of help with walking and/or transfers;Assistance with feeding   Equipment Recommendations  BSC/3in1;Tub/shower seat    Recommendations for Other Services      Precautions / Restrictions  Precautions Precautions: Fall Restrictions Weight Bearing Restrictions: No       Mobility Bed Mobility Overal bed mobility: Needs Assistance             General bed mobility comments: EOB on arrival; chiar on departure    Transfers Overall transfer level: Needs assistance Equipment used: Rolling walker (2 wheels) Transfers: Sit to/from Stand Sit to Stand: Mod assist     Step pivot transfers: Mod assist     General transfer comment: some lifting help to stand from EOB x4 and recliner x3; significant cueing for anterior weight shift despite cues RW for STS but face to face transfer for step pivot to optimize safety as pt with poor ability to manage RW     Balance Overall balance assessment: Needs assistance Sitting-balance support: Single extremity supported, Feet supported, No upper extremity supported Sitting balance-Leahy Scale: Fair     Standing balance support: Bilateral upper extremity supported, During functional activity, Reliant on assistive device for balance Standing balance-Leahy Scale: Poor Standing balance comment: relies on RW and external support with some posterior bias                           ADL either performed or assessed with clinical judgement   ADL Overall ADL's : Needs assistance/impaired     Grooming: Set up;Sitting;Moderate assistance;Standing Grooming Details (indicate cue type and reason): set up to brush teeth in sitting. Mod A for stand to spit in sink.                 Toilet Transfer: Moderate assistance;Stand-pivot;Cueing for sequencing;Cueing for safety Toilet Transfer Details (indicate cue type and reason): Step pivot transfer face to face to optimize balance and pt success as unable to maintain  grasp on RW this session         Functional mobility during ADLs: Moderate assistance General ADL Comments: STS x7 this session with min A for rise but mod A to maintain standing initially secondary to poor muscular  endurance.    Extremity/Trunk Assessment Upper Extremity Assessment Upper Extremity Assessment: Generalized weakness;Right hand dominant;RUE deficits/detail;LUE deficits/detail RUE Deficits / Details: Generalized weakness; Overall ROM WFL but with pt presenting with increased pain with shoulder AROM/AROM with with radiograph this admission revealing no fractures but positive for degenerative changes and consistent with chronic rotator cuff arthropathy and likely degenerative rotator cuff tear. Moderately edematous hand and forearm noted. Mildly decreased fine motor coordinaiton and dexterity at baseline. BUE tremor present after effort with mobility this session RUE Coordination: decreased fine motor LUE Deficits / Details: Generalized weaknsess. Impaired range of motion in digits III through V of hand at baseline; decreased fine motor coordination; mildly edemotous hand noted. BUE tremor present after effort with mobility today.   Lower Extremity Assessment Lower Extremity Assessment: Defer to PT evaluation        Vision       Perception     Praxis      Cognition Arousal: Alert Behavior During Therapy: WFL for tasks assessed/performed Overall Cognitive Status: Within Functional Limits for tasks assessed                                          Exercises Other Exercises Other Exercises: STS x 7 this session    Shoulder Instructions       General Comments VSS on RA    Pertinent Vitals/ Pain       Pain Assessment Pain Assessment: Faces Faces Pain Scale: Hurts a little bit Pain Location: feet Pain Descriptors / Indicators: Aching, Grimacing, Guarding, Sore Pain Intervention(s): Limited activity within patient's tolerance, Monitored during session  Home Living                                          Prior Functioning/Environment              Frequency  Min 1X/week        Progress Toward Goals  OT Goals(current goals can  now be found in the care plan section)  Progress towards OT goals: Progressing toward goals (slowly)  Acute Rehab OT Goals Patient Stated Goal: get better OT Goal Formulation: With patient Time For Goal Achievement: 03/15/23 Potential to Achieve Goals: Good ADL Goals Pt Will Perform Upper Body Bathing: with supervision;sitting Pt Will Perform Lower Body Bathing: with contact guard assist;sit to/from stand;sitting/lateral leans Pt Will Perform Upper Body Dressing: with supervision;sitting Pt Will Perform Lower Body Dressing: with supervision;sit to/from stand;sitting/lateral leans Pt Will Transfer to Toilet: with supervision;ambulating;bedside commode Pt Will Perform Toileting - Clothing Manipulation and hygiene: with contact guard assist;sitting/lateral leans;sit to/from stand Pt/caregiver will Perform Home Exercise Program: Both right and left upper extremity;With theraband;Increased strength;With Supervision;With written HEP provided  Plan      Co-evaluation                 AM-PAC OT "6 Clicks" Daily Activity     Outcome Measure   Help from another person eating meals?: None Help from another person taking care of personal grooming?: A Little Help  from another person toileting, which includes using toliet, bedpan, or urinal?: A Lot Help from another person bathing (including washing, rinsing, drying)?: A Lot Help from another person to put on and taking off regular upper body clothing?: A Little Help from another person to put on and taking off regular lower body clothing?: A Lot 6 Click Score: 16    End of Session Equipment Utilized During Treatment: Gait belt;Rolling walker (2 wheels)  OT Visit Diagnosis: Other abnormalities of gait and mobility (R26.89);History of falling (Z91.81);Repeated falls (R29.6);Muscle weakness (generalized) (M62.81);Pain;Feeding difficulties (R63.3)   Activity Tolerance Patient tolerated treatment well   Patient Left in chair;with call  bell/phone within reach;with chair alarm set   Nurse Communication Mobility status        Time: 2355-7322 OT Time Calculation (min): 37 min  Charges: OT General Charges $OT Visit: 1 Visit OT Treatments $Self Care/Home Management : 8-22 mins $Therapeutic Activity: 8-22 mins  Myrla Halsted, OTD, OTR/L [facility] Acute Rehabilitation Office: [phone]   Myrla Halsted 03/07/2023, 9:35 AM

## 2023-03-07 NOTE — Consult Note (Signed)
Physical Medicine and Rehabilitation Consult Reason for Consult: Cardiac debility Referring Physician: Zannie Cove, MD   HPI: Corey [name] is a 77 y.o. male combined HFrEF EF 20-25%, DM, amyloidosis, pericardial window 06/2020, afib, HTN, OSA, prostate cancer, seizure after 2014 CVA, gout, CKD stage 4, SOB, leg swelling, dyspnea. He was sent from the cariology clinic on 10/16 because of worsening dyspnea and orthopnea and weight gain of 10lbs. He was diagnosed with renal failure. Physical Medicine & Rehabilitation was consulted to assess candidacy for CIR.     ROS +right lower extremity weakness Past Medical History:  Diagnosis Date   Cataract    CHF (congestive heart failure) (HCC)    Chronic heart failure with preserved ejection fraction (HFpEF) (HCC)    Colon polyp 2003   Dr Victorino Dike; F/U was to be 2008( not completed)   Coronary artery disease    CVA (cerebral infarction) 2014   Diabetes mellitus    Diverticulosis 2003   Dyspnea    with exertion   Gout    Hereditary cardiac amyloidosis (HCC)    Hypertension    Myocardial infarction (HCC) 09/19/2012   PEA cardiac arrest in setting of acute respiratory failure/pulmonary edema   OSA (obstructive sleep apnea) 03/27/2018   Pneumonia 09/29/2011   Avelox X 10 days as OP   Prostate cancer (HCC)    Renal insufficiency    Seizures (HCC) 09/19/2012   not treated for seizure disorder; had a seizure after stroke 2014; no seizure since then   Stroke [facility])    Past Surgical History:  Procedure Laterality Date   A-FLUTTER ABLATION N/A 06/23/2022   Procedure: A-FLUTTER ABLATION;  Surgeon: Maurice Small, MD;  Location: [facility] INVASIVE CV LAB;  Service: Cardiovascular;  Laterality: N/A;   A/V FISTULAGRAM Left 04/28/2022   Procedure: A/V Fistulagram;  Surgeon: Chuck Hint, MD;  Location: [facility] INVASIVE CV LAB;  Service: Cardiovascular;  Laterality: Left;   AV FISTULA PLACEMENT Left 01/31/2022   Procedure: LEFT ARM  ARTERIOVENOUS (AV) FISTULA CREATION;  Surgeon: Maeola Harman, MD;  Location: [facility] OR;  Service: Vascular;  Laterality: Left;  regional block to left arm   CARDIOVERSION N/A 03/24/2022   Procedure: CARDIOVERSION;  Surgeon: Dorthula Nettles, DO;  Location: [facility] ENDOSCOPY;  Service: Cardiovascular;  Laterality: N/A;   COLONOSCOPY W/ POLYPECTOMY  2003   no F/U (SOC discussed 12/03/12)   INGUINAL HERNIA REPAIR Left 06/16/2021   Procedure: LAPAROSCOPIC, POSSIBLY OPEN LEFT INGUINAL HERNIA REPAIR WITH MESH;  Surgeon: Quentin Ore, MD;  Location: [facility] ORS;  Service: General;  Laterality: Left;   INSERTION OF MESH N/A 07/16/2017   Procedure: INSERTION OF MESH;  Surgeon: Berna Bue, MD;  Location: [facility] OR;  Service: General;  Laterality: N/A;   PACEMAKER IMPLANT N/A 12/28/2022   Procedure: PACEMAKER IMPLANT;  Surgeon: Maurice Small, MD;  Location: [facility] INVASIVE CV LAB;  Service: Cardiovascular;  Laterality: N/A;   PEG PLACEMENT  2014   10/08/12-12/10/12   PEG TUBE REMOVAL  2014   PERIPHERAL VASCULAR BALLOON ANGIOPLASTY  04/28/2022   Procedure: PERIPHERAL VASCULAR BALLOON ANGIOPLASTY;  Surgeon: Chuck Hint, MD;  Location: [facility] INVASIVE CV LAB;  Service: Cardiovascular;;   PROSTATE BIOPSY     RIGHT HEART CATH N/A 03/24/2022   Procedure: RIGHT HEART CATH;  Surgeon: Dorthula Nettles, DO;  Location: [facility] INVASIVE CV LAB;  Service: Cardiovascular;  Laterality: N/A;   RIGHT/LEFT HEART CATH AND CORONARY ANGIOGRAPHY N/A 09/20/2020   Procedure:  RIGHT/LEFT HEART CATH AND CORONARY ANGIOGRAPHY;  Surgeon: Marykay Lex, MD;  Location: [facility] INVASIVE CV LAB;  Service: Cardiovascular;  Laterality: N/A;   SUBQ ICD IMPLANT N/A 09/04/2022   Procedure: SUBQ ICD IMPLANT;  Surgeon: Maurice Small, MD;  Location: [facility] INVASIVE CV LAB;  Service: Cardiovascular;  Laterality: N/A;   TEE WITHOUT CARDIOVERSION N/A 09/27/2020   Procedure: TRANSESOPHAGEAL ECHOCARDIOGRAM (TEE);  Surgeon: Linden Dolin,  MD;  Location: [facility] OR;  Service: Thoracic;  Laterality: N/A;   TEE WITHOUT CARDIOVERSION N/A 03/24/2022   Procedure: TRANSESOPHAGEAL ECHOCARDIOGRAM (TEE);  Surgeon: Dorthula Nettles, DO;  Location: [facility] ENDOSCOPY;  Service: Cardiovascular;  Laterality: N/A;   TEE WITHOUT CARDIOVERSION N/A 06/23/2022   Procedure: TRANSESOPHAGEAL ECHOCARDIOGRAM (TEE);  Surgeon: Maurice Small, MD;  Location: [facility] INVASIVE CV LAB;  Service: Cardiovascular;  Laterality: N/A;   TRACHEOSTOMY  2014   09/30/12-10/20/12   VENTRAL HERNIA REPAIR N/A 07/16/2017   Procedure: LAPAROSCOPIC VENTRAL HERNIA REPAIR WITH MESH;  Surgeon: Berna Bue, MD;  Location: [facility] OR;  Service: General;  Laterality: N/A;   wrist aspiration  02/16/2012    monosodium urate crystals; Dr Melvyn Novas   Family History  Problem Relation Age of Onset   Heart failure Mother    Hypertension Mother    Diabetes Mother    Hypertension Father    Prostate cancer Father    Diabetes Father    Cancer Father    Heart failure Brother    Stroke Neg Hx    Heart disease Neg Hx    Colon cancer Neg Hx    Social History:  reports that he quit smoking about 54 years ago. His smoking use included cigarettes. He started smoking about 55 years ago. He has a 0.3 pack-year smoking history. He has never used smokeless tobacco. He reports that he does not drink alcohol and does not use drugs. Allergies:  Allergies  Allergen Reactions   Hydrochlorothiazide Other (See Comments)    Gout , uncontrolled diabetes and renal insufficiency   Medications Prior to Admission  Medication Sig Dispense Refill   acetaminophen (TYLENOL) 500 MG tablet Take 1,000 mg by mouth every 8 (eight) hours as needed for moderate pain.     albuterol (VENTOLIN HFA) 108 (90 Base) MCG/ACT inhaler Inhale 2 puffs into the lungs every 4 (four) hours as needed for wheezing or shortness of breath. 18 g 1   allopurinol (ZYLOPRIM) 100 MG tablet Take 1/2 (one-half) tablet by mouth once daily 45 tablet 0    apixaban (ELIQUIS) 5 MG TABS tablet Take 1 tablet by mouth twice daily 60 tablet 5   bismuth subsalicylate (PEPTO BISMOL) 262 MG/15ML suspension Take 30 mLs by mouth every 6 (six) hours as needed for indigestion or diarrhea or loose stools.     cholecalciferol (VITAMIN D3) 25 MCG (1000 UNIT) tablet Take 1,000 Units by mouth daily.     colchicine (COLCRYS) 0.6 MG tablet Take 2 tabs once and then one tab one hour later as needed for gout (Patient taking differently: Take 0.6 mg by mouth as needed (gout).) 15 tablet 2   hydrALAZINE (APRESOLINE) 25 MG tablet TAKE 1 TABLET THREE TIMES DAILY 90 tablet 11   rosuvastatin (CRESTOR) 5 MG tablet Take 1 tablet (5 mg total) by mouth daily. 90 tablet 3   sennosides-docusate sodium (SENOKOT-S) 8.6-50 MG tablet Take 2 tablets by mouth daily as needed for constipation.     sitaGLIPtin (JANUVIA) 50 MG tablet Take 0.5 tablets (25 mg total) by mouth  daily. 30 tablet 0   sodium bicarbonate 650 MG tablet TAKE 1 TABLET BY MOUTH TWICE DAILY . APPOINTMENT REQUIRED FOR FUTURE REFILLS 60 tablet 0   Tafamidis (VYNDAMAX) 61 MG CAPS Take 1 capsule (61 mg total) by mouth daily. 30 capsule 12   torsemide (DEMADEX) 20 MG tablet Take 20 mg by mouth 2 (two) times daily.     glucose blood (ACCU-CHEK AVIVA PLUS) test strip Use to check blood glucose 4 times a day. 400 strip 2   TRUEplus Lancets 33G MISC USE 4 (FOUR) TIMES DAILY 400 each 2    Home: Home Living Family/patient expects to be discharged to:: Private residence Living Arrangements: Spouse/significant other, Children (son) Available Help at Discharge: Family, Available 24 hours/day Type of Home: House Home Access: Stairs to enter [employer] of Steps: 1 step in back, 3 in front with rail Entrance Stairs-Rails: Right Home Layout: One level Bathroom Shower/Tub: Engineer, manufacturing systems: Standard Bathroom Accessibility: Yes Home Equipment: Agricultural consultant (2 wheels)  Functional History: Prior  Function Prior Level of Function : Independent/Modified Independent Mobility Comments: Pt reports he typically performs funcitonal mobility Independent without an AD but reports two recent falls. ADLs Comments: Pt typically Independent with ADLs but wife started helping with showers about 1 week ago. Functional Status:  Mobility: Bed Mobility Overal bed mobility: Needs Assistance Bed Mobility: Supine to Sit Supine to sit: Min assist General bed mobility comments: EOB on arrival; chiar on departure Transfers Overall transfer level: Needs assistance Equipment used: Rolling walker (2 wheels) Transfers: Sit to/from Stand Sit to Stand: Mod assist Bed to/from chair/wheelchair/BSC transfer type:: Step pivot Stand pivot transfers: Min assist, +2 safety/equipment Step pivot transfers: Mod assist General transfer comment: some lifting help to stand from EOB x4 and recliner x3; significant cueing for anterior weight shift despite cues RW for STS but face to face transfer for step pivot to optimize safety as pt with poor ability to manage RW Ambulation/Gait Ambulation/Gait assistance: Min assist Gait Distance (Feet): 16 Feet (x 2) Assistive device: Rolling walker (2 wheels) Gait Pattern/deviations: Step-to pattern, Decreased stride length, Shuffle, Wide base of support, Leaning posteriorly General Gait Details: assist to walk around bed with help for walker management at times, assist for balance with occasional buckling and help for lines; walked back around bed to recliner per pt request    ADL: ADL Overall ADL's : Needs assistance/impaired Eating/Feeding: Modified independent, Sitting Grooming: Set up, Sitting, Moderate assistance, Standing Grooming Details (indicate cue type and reason): set up to brush teeth in sitting. Mod A for stand to spit in sink. Upper Body Bathing: Set up, Sitting Upper Body Bathing Details (indicate cue type and reason): increased time Lower Body Bathing:  Moderate assistance, Sitting/lateral leans, Sit to/from stand (with increased time) Upper Body Dressing : Minimal assistance, Sitting Lower Body Dressing: Minimal assistance, With adaptive equipment, Sitting/lateral leans Lower Body Dressing Details (indicate cue type and reason): to doff/don socks. Doffed first sock with min A with increased time. Introduced AE and min A due to decr dexterity and to optimize technique Toilet Transfer: Moderate assistance, Stand-pivot, Cueing for sequencing, Cueing for safety Toilet Transfer Details (indicate cue type and reason): Step pivot transfer face to face to optimize balance and pt success as unable to maintain grasp on RW this session Toileting- Clothing Manipulation and Hygiene: Maximal assistance, Sitting/lateral lean, Sit to/from stand Toileting - Clothing Manipulation Details (indicate cue type and reason): in simulated task at EOB Functional mobility during ADLs: Moderate assistance General  ADL Comments: STS x7 this session with min A for rise but mod A to maintain standing initially secondary to poor muscular endurance.  Cognition: Cognition Overall Cognitive Status: Within Functional Limits for tasks assessed Orientation Level: Oriented X4 Cognition Arousal: Alert Behavior During Therapy: WFL for tasks assessed/performed Overall Cognitive Status: Within Functional Limits for tasks assessed General Comments: Not formally tested. AAOx4 with cognition WFL for tasks performed. Able to follow 1 and 2 step directions consistently. Some increased time for new learning  Blood pressure 110/75, pulse 95, temperature 98.2 F (36.8 C), temperature source Oral, resp. rate 20, height 5\' 6"  (1.676 m), weight 79.7 kg, SpO2 96%. Physical Exam Gen: no distress, normal appearing HEENT: oral mucosa pink and moist, NCAT Cardio: Reg rate Chest: normal effort, normal rate of breathing Abd: soft, non-distended Ext: no edema Psych: pleasant, normal affect Skin:  intact Neuro: Alert, slowed processing, right lower extremity 3/5 strength, otherwise 4/5   Results for orders placed or performed during the hospital encounter of 02/27/23 (from the past 24 hour(s))  Glucose, capillary     Status: Abnormal   Collection Time: 03/06/23 12:10 PM  Result Value Ref Range   Glucose-Capillary 163 (H) 70 - 99 mg/dL  Glucose, capillary     Status: Abnormal   Collection Time: 03/06/23  5:04 PM  Result Value Ref Range   Glucose-Capillary 169 (H) 70 - 99 mg/dL  Glucose, capillary     Status: Abnormal   Collection Time: 03/06/23  8:37 PM  Result Value Ref Range   Glucose-Capillary 154 (H) 70 - 99 mg/dL   Comment 1 Notify RN    Comment 2 Document in Chart   Glucose, capillary     Status: Abnormal   Collection Time: 03/06/23 11:31 PM  Result Value Ref Range   Glucose-Capillary 133 (H) 70 - 99 mg/dL   Comment 1 Notify RN    Comment 2 Document in Chart   Renal function panel     Status: Abnormal   Collection Time: 03/07/23  2:14 AM  Result Value Ref Range   Sodium 133 (L) 135 - 145 mmol/L   Potassium 4.1 3.5 - 5.1 mmol/L   Chloride 93 (L) 98 - 111 mmol/L   CO2 29 22 - 32 mmol/L   Glucose, Bld 104 (H) 70 - 99 mg/dL   BUN 89 (H) 8 - 23 mg/dL   Creatinine, Ser 4.09 (H) 0.61 - 1.24 mg/dL   Calcium 8.9 8.9 - 81.1 mg/dL   Phosphorus 3.3 2.5 - 4.6 mg/dL   Albumin 2.4 (L) 3.5 - 5.0 g/dL   GFR, Estimated 22 (L) >60 mL/min   Anion gap 11 5 - 15  Magnesium     Status: Abnormal   Collection Time: 03/07/23  2:14 AM  Result Value Ref Range   Magnesium 2.7 (H) 1.7 - 2.4 mg/dL  Glucose, capillary     Status: None   Collection Time: 03/07/23  3:58 AM  Result Value Ref Range   Glucose-Capillary 88 70 - 99 mg/dL  Glucose, capillary     Status: Abnormal   Collection Time: 03/07/23  7:30 AM  Result Value Ref Range   Glucose-Capillary 144 (H) 70 - 99 mg/dL   Comment 1 Notify RN    Comment 2 Document in Chart   Ammonia     Status: Abnormal   Collection Time: 03/07/23   7:36 AM  Result Value Ref Range   Ammonia 53 (H) 9 - 35 umol/L   *Note: Due to  a large number of results and/or encounters for the requested time period, some results have not been displayed. A complete set of results can be found in Results Review.   No results found.  Assessment/Plan: Diagnosis: Cardiac debility Does the need for close, 24 hr/day medical supervision in concert with the patient's rehab needs make it unreasonable for this patient to be served in a less intensive setting? Yes Co-Morbidities requiring supervision/potential complications:  1) HTN: continue to monitor blood pressures TID 2) CHF: continue diuresis 3) Cardiac amyloidosis 4) DM 5) Hypotension: continue midodrine 6) Overweight: provide dietary education Due to bladder management, bowel management, safety, skin/wound care, disease management, medication administration, pain management, and patient education, does the patient require 24 hr/day rehab nursing? Yes Does the patient require coordinated care of a physician, rehab nurse, therapy disciplines of PT, OT to address physical and functional deficits in the context of the above medical diagnosis(es)? Yes Addressing deficits in the following areas: balance, endurance, locomotion, strength, transferring, bowel/bladder control, bathing, dressing, feeding, grooming, toileting, and psychosocial support Can the patient actively participate in an intensive therapy program of at least 3 hrs of therapy per day at least 5 days per week? Yes The potential for patient to make measurable gains while on inpatient rehab is excellent Anticipated functional outcomes upon discharge from inpatient rehab are supervision  with PT, supervision with OT, supervision with SLP. Estimated rehab length of stay to reach the above functional goals is: 10-14 days Anticipated discharge destination: Home Overall Rehab/Functional Prognosis: excellent  POST ACUTE RECOMMENDATIONS: This  patient's condition is appropriate for continued rehabilitative care in the following setting: CIR Patient has agreed to participate in recommended program. Yes Note that insurance prior authorization may be required for reimbursement for recommended care.  ally performed a face to face diagnostic evaluation of this patient. Additionally, I have examined the patient's medical record including any pertinent labs and radiographic images. If the physician assistant has documented in this note, I have reviewed and edited or otherwise concur with the physician assistant's documentation.  Thanks,  Horton Chin, MD 03/07/2023

## 2023-03-07 NOTE — Progress Notes (Signed)
Physical Therapy Treatment Patient Details Name: Corey [name] MRN: [id number] DOB: 20-Feb-1946 Today's Date: 03/07/2023   History of Present Illness Pt is a 77 y.o. male presenting 10/15 from cardiologist office with distended abdomen, BLE swelling and weeping. CXR showing mild pulmonary interstitial edema and L>R pleural effusions with adjacent airspace opacity which may reflect atelectasis or infection. Pt also with R shoulder pain post recent fall at home with radiograph with no fractures but positive for degenerative changes and consistent with chronic rotator cuff arthropathy and likely degenerative rotator cuff tear. PMH significant for cardiac amyloidosis (ATTR), HFrEF, PEA arrest, pericardial effusion, hypertension, hyperlipidemia, CKD stage IV, DMII, CVA, A-fib/flutter s/p CTI ablation March 2024 on Eliquis, s/p CRT placement August 2024, gout, prostate cancer, seizures.    PT Comments  Patient with limited progress this session due to LE buckling and noting muscle impersistence during standing while NT assisting with hygiene.  He needed mod A to stand to RW then legs did not hold so sat back on recliner.  Knees blocked by therapist in standing for hygiene tasks.  Patient eager to improve and wife supportive.  Continue to recommend intensive inpatient rehab prior to d/c home.    If plan is discharge home, recommend the following: A little help with walking and/or transfers;Assistance with cooking/housework;Assist for transportation;Help with stairs or ramp for entrance;A little help with bathing/dressing/bathroom   Can travel by private vehicle        Equipment Recommendations  Other (comment) (TBA)    Recommendations for Other Services       Precautions / Restrictions Precautions Precautions: Fall Precaution Comments: knees buckling     Mobility  Bed Mobility               General bed mobility comments: in recliner    Transfers Overall transfer level: Needs  assistance Equipment used: Rolling walker (2 wheels) Transfers: Sit to/from Stand Sit to Stand: Mod assist           General transfer comment: stood with mod A from recliner to RW x 1 then both knees buckled and pt sat back on recliner; assisted to stand with PT in front to block knees to allow pt's wife and NT to assist for hygiene as pt had been in recliner on bed pan and soiled with BM.  Patient able to stand about 3 minutes with PT blocking knees though noted with continued muscle impersistence consistent with asterixis.    Ambulation/Gait               General Gait Details: unable due to knee buckling   Stairs             Wheelchair Mobility     Tilt Bed    Modified Rankin (Stroke Patients Only)       Balance Overall balance assessment: Needs assistance   Sitting balance-Leahy Scale: Fair     Standing balance support: Bilateral upper extremity supported Standing balance-Leahy Scale: Poor                              Cognition Arousal: Alert Behavior During Therapy: Flat affect Overall Cognitive Status: Within Functional Limits for tasks assessed                                          Exercises General Exercises -  Lower Extremity Long Arc Quad: AROM, Both, 10 reps, Seated Hip Flexion/Marching: AROM, Both, 10 reps, Seated Other Exercises Other Exercises: scapular squeezes x 10 Other Exercises: trunk flexion arms crossed over chest seated x 10    General Comments General comments (skin integrity, edema, etc.): wife present and supportive, on 4L O2 with VSS.      Pertinent Vitals/Pain Pain Assessment Pain Assessment: Faces Faces Pain Scale: Hurts a little bit Pain Location: feet Pain Descriptors / Indicators: Aching, Grimacing, Guarding, Sore Pain Intervention(s): Monitored during session, Repositioned    Home Living                          Prior Function            PT Goals (current goals  can now be found in the care plan section) Progress towards PT goals: Not progressing toward goals - comment    Frequency    Min 1X/week      PT Plan      Co-evaluation              AM-PAC PT "6 Clicks" Mobility   Outcome Measure  Help needed turning from your back to your side while in a flat bed without using bedrails?: A Little Help needed moving from lying on your back to sitting on the side of a flat bed without using bedrails?: A Little Help needed moving to and from a bed to a chair (including a wheelchair)?: A Lot Help needed standing up from a chair using your arms (e.g., wheelchair or bedside chair)?: A Lot Help needed to walk in hospital room?: Total Help needed climbing 3-5 steps with a railing? : Total 6 Click Score: 12    End of Session Equipment Utilized During Treatment: Gait belt;Oxygen Activity Tolerance: Patient tolerated treatment well Patient left: in chair;with call bell/phone within reach;with family/visitor present;with chair alarm set   PT Visit Diagnosis: Other abnormalities of gait and mobility (R26.89);Muscle weakness (generalized) (M62.81);History of falling (Z91.81)     Time: 1500-1530 PT Time Calculation (min) (ACUTE ONLY): 30 min  Charges:    $Therapeutic Exercise: 8-22 mins $Therapeutic Activity: 8-22 mins PT General Charges $$ ACUTE PT VISIT: 1 Visit                     [name], PT Acute Rehabilitation Services [phone] 03/07/2023    [name] 03/07/2023, 5:10 PM

## 2023-03-07 NOTE — Progress Notes (Signed)
[facility] Progress Note   77 y.o. male combined HFrEF EF 20-25%, DM, amyloidosis, pericardial window 06/2020, afib, CASHD, HTN, OSA, prostate cancer, seizure only after the CVA in 2014, gout CKD4 with a baseline cr between 2.7-3.1 followed by Dr. Vallery Sa p/w SOB, leg swelling. He had a failed left BCF which is thrombosed and was sent from cardiology clinic on 10/16 because of worsening dyspnea, orthopnea having gained 10lbs of weight. He was noted to have rales bilaterally in the ED with CXR showing congestion.    Assessment/ Plan:   Renal failure with h/o CKDIV with a baseline creatinine in the 2.7-3.1 range and h/o failed left BCF (placed 01/31/22). Had been putting out usually >2L UOP/24hr range but over the past 24hrs that has dropped to 1.4L. Creatinine has slowly increasing over the past few days. Usually would hold diuretics when renal function trends the wrong direction but he is markedly volume overloaded. Weight was 144.8 lbs with minimal edema noted on 11/20/22. On 08/22/2022 he was 168lbs but noted to have 1+ edema in the lower ext at that time. Currently he is ~180lbs.  - He has no emergent need for HD however may be moving in that direction; still with fairly dense edema and clinically appears dyspneic. He states it's a little better; spouse has noticed the SOB as well. - Increased Lasix to 120mg  q6hrs on 10/22; I also explained to the spouse and pt he may end up on dialysis but unfortunately he is markedly overloaded and needs to be diuresed. Responding well with impr renal function and 3.15L out.  - Continue diuresis for now - Floor scale only if possible in the AM after voiding and before eating. Should be 144-150 lbs in that range (144.8lb on 11/20/22).  -Maintain MAP>65 for optimal renal perfusion.  - Avoid nephrotoxic medications including NSAIDs and iodinated intravenous contrast exposure unless the latter is absolutely indicated.   - Preferred narcotic agents for  pain control are hydromorphone, fentanyl, and methadone. Morphine should not be used.  - Avoid Baclofen and avoid oral sodium phosphate and magnesium citrate based laxatives / bowel preps.  - Continue strict Input and Output monitoring. Will monitor the patient closely with you and intervene or adjust therapy as indicated by changes in clinical status/labs   CHF - still appears to be decompensated and needs to continue diuresis HTN Secondary hyperparathyroidism - will check a P and iPTH Cardiac amyloidosis seen by cardiology clinic Proteinuria - secondary to DM vs. amyloid; subnephrotic - off of ARB and jardiance with advanced CKD and anticipate need for HD soon DM - per primary team  Subjective:   Feels his breathing is better. Denies f/c/n/v. Wife not here this am.   Objective:   BP 110/75 (BP Location: Right Arm)   Pulse 95   Temp 98.2 F (36.8 C) (Oral)   Resp 20   Ht 5\' 6"  (1.676 m)   Wt 79.7 kg   SpO2 96%   BMI 28.36 kg/m   Intake/Output Summary (Last 24 hours) at 03/07/2023 1013 Last data filed at 03/07/2023 7829 Gross per 24 hour  Intake 1043.97 ml  Output 3000 ml  Net -1956.03 ml   Weight change: -3.2 kg  Physical Exam: Gen - Adult male seated in NAD; less labored breathing today, able to speak in full sentences HEENT - NCAT/EOMI; sclera anicteric Neck - supple Cardiovascular - S1S2; no rub; edema bilateral to thighs, PM/Defib right chest wall Chest/Lungs - poor air movement, rales present  Abdomen - soft/NT/ND Psych normal mood and affect Skin - no rash; no lesions on exposed areas Access: LUE AVF no bruit or thrill  Imaging: No results found.  Labs: BMET Recent Labs  Lab 03/01/23 0221 03/02/23 1218 03/03/23 0216 03/04/23 0216 03/05/23 0219 03/06/23 0232 03/07/23 0214  NA 138 138 137 136 136 136 133*  K 4.6 4.7 4.7 4.4 4.4 4.5 4.1  CL 102 100 98 97* 97* 96* 93*  CO2 26 25 25 26 27 30 29   GLUCOSE 100* 135* 103* 85 143* 142* 104*  BUN 68* 69*  68* 73* 86* 87* 89*  CREATININE 3.19* 3.14* 3.09* 2.92* 3.16* 3.30* 2.89*  CALCIUM 9.0 9.4 9.9 9.5 9.2 9.1 8.9  PHOS  --   --   --   --   --   --  3.3   CBC Recent Labs  Lab 03/05/23 0219 03/06/23 0232  WBC 2.8* 3.1*  HGB 11.2* 10.9*  HCT 36.9* 36.1*  MCV 82.6 83.2  PLT 133* 162    Medications:     allopurinol  50 mg Oral Daily   apixaban  5 mg Oral BID   feeding supplement  237 mL Oral BID BM   gabapentin  100 mg Oral QHS   influenza vaccine adjuvanted  0.5 mL Intramuscular Tomorrow-1000   midodrine  5 mg Oral TID WC   polyethylene glycol  17 g Oral Daily   rosuvastatin  5 mg Oral Daily   Tafamidis  61 mg Oral Daily      Paulene Floor, MD 03/07/2023, 10:13 AM

## 2023-03-07 NOTE — Progress Notes (Signed)
Inpatient Rehab Admissions Coordinator:     I met with pt. To discuss potential CIR admit. He is interested and states wife could provide 24/7 support at d/c. I will call her to confirm and will open case with insurance today.   Megan Salon, MS, CCC-SLP Rehab Admissions Coordinator  [phone] (celll) [phone] (office)

## 2023-03-08 DIAGNOSIS — I5023 Acute on chronic systolic (congestive) heart failure: Secondary | ICD-10-CM | POA: Diagnosis not present

## 2023-03-08 LAB — BASIC METABOLIC PANEL
Anion gap: 12 (ref 5–15)
BUN: 89 mg/dL — ABNORMAL HIGH (ref 8–23)
CO2: 32 mmol/L (ref 22–32)
Calcium: 9.1 mg/dL (ref 8.9–10.3)
Chloride: 93 mmol/L — ABNORMAL LOW (ref 98–111)
Creatinine, Ser: 2.86 mg/dL — ABNORMAL HIGH (ref 0.61–1.24)
GFR, Estimated: 22 mL/min — ABNORMAL LOW (ref >60)
Glucose, Bld: 97 mg/dL (ref 70–99)
Potassium: 3.9 mmol/L (ref 3.5–5.1)
Sodium: 137 mmol/L (ref 135–145)

## 2023-03-08 LAB — URINALYSIS, ROUTINE W REFLEX MICROSCOPIC
Bilirubin Urine: NEGATIVE
Glucose, UA: NEGATIVE mg/dL
Hgb urine dipstick: NEGATIVE
Ketones, ur: NEGATIVE mg/dL
Leukocytes,Ua: NEGATIVE
Nitrite: NEGATIVE
Protein, ur: NEGATIVE mg/dL
Specific Gravity, Urine: 1.008 (ref 1.005–1.030)
pH: 5 (ref 5.0–8.0)

## 2023-03-08 LAB — GLUCOSE, CAPILLARY
Glucose-Capillary: 93 mg/dL (ref 70–99)
Glucose-Capillary: 161 mg/dL — ABNORMAL HIGH (ref 70–99)
Glucose-Capillary: 177 mg/dL — ABNORMAL HIGH (ref 70–99)
Glucose-Capillary: 186 mg/dL — ABNORMAL HIGH (ref 70–99)
Glucose-Capillary: 213 mg/dL — ABNORMAL HIGH (ref 70–99)

## 2023-03-08 NOTE — Progress Notes (Signed)
pt c/o pain when voiding.... pain is at the end of his stream and it's getting better but has some discomfort.   Bladder scanned him and he is retaining 368 cc. Notified MD.

## 2023-03-08 NOTE — Progress Notes (Signed)
Mobility Specialist Progress Note:   03/08/23 0900  Oxygen Therapy  O2 Device Nasal Cannula  Mobility  Activity Stood at bedside  Level of Assistance Moderate assist, patient does 50-74%  Assistive Device Other (Comment) (HHA/Gait Belt)  Activity Response Tolerated well  Mobility Referral Yes  $Mobility charge 1 Mobility  Mobility Specialist Start Time (ACUTE ONLY) [id number]  Mobility Specialist Stop Time (ACUTE ONLY) [id number]  Mobility Specialist Time Calculation (min) (ACUTE ONLY) 17 min    Pre Mobility: 63 HR,  96% SpO2 Post Mobility:  64 HR  Pt received in chair, agreeable to mobility. C/o some BLE pain. Performed x2 STS with HHA/ModA. Pt asymptomatic throughout w/ no complaints and able to stand for 1+ min. Pt left in chair with call bell and all needs met. Chair alarm on.  Corey [name] Mobility Specialist Please contact via Special educational needs teacher or Rehab office at [phone]

## 2023-03-08 NOTE — Progress Notes (Signed)
PROGRESS NOTE    Corey [name]  VZD:[id number] DOB: [birth year] DOA: 02/27/2023 PCP: Pincus Sanes, MD  77/M w cardiac amyloidosis, heart failure, pericardial effusion (sp pericardial window), atrial fibrillation, CVA, CKD stage IV, T2DM and prior cardiac arrest (PEA), who presented with lower extremity edema and dyspnea for about 2 weeks, associated with orthopnea, cough, and 10 lbs weight gain. On the day of hospitalization he was evaluated at the cardiology clinic, pre op evaluation for HD access, he was found volume overloaded and was referred to the ED for further evaluation.  Na 141, bun 73, cr 3,14, hgb 11,6 plt 140  CXR w/ hypoinflation, cardiomegaly, bilateral hilar vascular congestion, small left pleural effusion, pacemaker with one right atrial and one right ventricular lead.   10/17 patient placed on IV furosemide, continue volume overloaded.  10/18 improved diuresis.  10/19 tolerating well IV furosemide, but continue volume overloaded.  10/21 volume status has improved. Vascular surgery recommended follow up as outpatient for AV fistula.  10/22 nephrology consulted, Lasix increased to 120 Mg every 6    Subjective: Shakes, tremors improving, breathing is slowly improving as well  Assessment and Plan:  Acute on chronic systolic CHF (congestive heart failure) (HCC) -Echo with EF 20 to 25%, global hypokinesis, severe LVH, RV with severe reduction in systolic function, RA with moderate dilatation,  -diuresed with IV Lasix, appreciate nephrology input, continue Lasix 120 Mg Q6 -He is 13.2 L negative, weight down 19 LB, creatinine improving, 2.8 now, baseline from 2.8-3.3 -Continue midodrine -GDMT limited by CKD 4 -CIR eval ongoing  Acute hypoxemic respiratory due to acute cardiogenic pulmonary edema.  -V/Q scan with no evidence of pulmonary embolism.  -Cardiac amyloidosis, continue with tafamidis.  -Follow-up with heart failure team  Acute renal failure superimposed on stage 4  chronic kidney disease (HCC) -Baseline creatinine 2.8-3.3, creatinine peaked at 3.3, now improving -Appreciate nephrology input, Lasix as above -Midodrine, avoid hypotension -Has thrombosed left arm AV fistula, Dr. Ella Jubilee discussed with vascular surgery, they recommended outpatient follow-up as no plans for urgent renal replacement therapy at this time  Anemia of chronic renal disease, with hgb at 10,9 -Stable, has mild pancytopenia as well could be secondary to amyloidosis/tafamidis  Paroxysmal atrial fibrillation (HCC) 12/28/22 CRT pacemaker implantation, BiV pacing - anticoagulation with apixaban.   Essential hypertension -Midodrine and Lasix as above  CAD (coronary artery disease) No chest pain, no acute coronary syndrome.   Non-insulin dependent type 2 diabetes mellitus (HCC) Hypoglycemia.  -CBGs are stable  Peripheral neuropathy Continue gabapentin, dose decreased  Right shoulder pain Radiograph with no fractures.  Positive degenerative changes, acromiohumeral abutment consistent with chronic rotator cuff arthropathy and likely degenerative rotator cuff tear.   Prostate cancer (HCC) Follow up as outpatient.   DVT prophylaxis: apixaban Code Status: Full Code Family Communication: No family at bedside Disposition Plan: CIR are, possibly weekend  Consultants:    Procedures:   Antimicrobials:    Objective: Vitals:   03/08/23 0700 03/08/23 0715 03/08/23 0800 03/08/23 1105  BP:  103/69  118/82  Pulse:  60 64 60  Resp:  19 19 16   Temp:  98.7 F (37.1 C)  98.5 F (36.9 C)  TempSrc:  Oral  Oral  SpO2:  95% 94% 98%  Weight: 78.4 kg     Height:        Intake/Output Summary (Last 24 hours) at 03/08/2023 1236 Last data filed at 03/08/2023 1200 Gross per 24 hour  Intake 1092.03 ml  Output 3125 ml  Net -2032.97 ml   Filed Weights   03/06/23 0645 03/07/23 0653 03/08/23 0700  Weight: 82.9 kg 79.7 kg 78.4 kg    Examination:  Gen: Awake, Alert, Oriented X  3, elderly, chronically ill male HEENT:+ JVD Lungs: Good air movement bilaterally, CTAB CVS: S1S2/RRR Abd: soft, Non tender, non distended, BS present Extremities: 1+ edema Skin: no new rashes on exposed skin     Data Reviewed:   CBC: Recent Labs  Lab 03/05/23 0219 03/06/23 0232  WBC 2.8* 3.1*  HGB 11.2* 10.9*  HCT 36.9* 36.1*  MCV 82.6 83.2  PLT 133* 162   Basic Metabolic Panel: Recent Labs  Lab 03/02/23 1218 03/03/23 0216 03/04/23 0216 03/05/23 0219 03/06/23 0232 03/07/23 0214 03/08/23 0220  NA 138 137 136 136 136 133* 137  K 4.7 4.7 4.4 4.4 4.5 4.1 3.9  CL 100 98 97* 97* 96* 93* 93*  CO2 25 25 26 27 30 29  32  GLUCOSE 135* 103* 85 143* 142* 104* 97  BUN 69* 68* 73* 86* 87* 89* 89*  CREATININE 3.14* 3.09* 2.92* 3.16* 3.30* 2.89* 2.86*  CALCIUM 9.4 9.9 9.5 9.2 9.1 8.9 9.1  MG 2.7* 2.7*  --   --   --  2.7*  --   PHOS  --   --   --   --   --  3.3  --    GFR: Estimated Creatinine Clearance: 22.3 mL/min (A) (by C-G formula based on SCr of 2.86 mg/dL (H)). Liver Function Tests: Recent Labs  Lab 03/07/23 0214  ALBUMIN 2.4*   No results for input(s): "LIPASE", "AMYLASE" in the last 168 hours. Recent Labs  Lab 03/07/23 0736  AMMONIA 53*   Coagulation Profile: No results for input(s): "INR", "PROTIME" in the last 168 hours. Cardiac Enzymes: No results for input(s): "CKTOTAL", "CKMB", "CKMBINDEX", "TROPONINI" in the last 168 hours. BNP (last 3 results) No results for input(s): "PROBNP" in the last 8760 hours. HbA1C: No results for input(s): "HGBA1C" in the last 72 hours. CBG: Recent Labs  Lab 03/07/23 1921 03/07/23 2316 03/08/23 0411 03/08/23 0741 03/08/23 1107  GLUCAP 163* 168* 93 161* 177*   Lipid Profile: No results for input(s): "CHOL", "HDL", "LDLCALC", "TRIG", "CHOLHDL", "LDLDIRECT" in the last 72 hours. Thyroid Function Tests: No results for input(s): "TSH", "T4TOTAL", "FREET4", "T3FREE", "THYROIDAB" in the last 72 hours. Anemia Panel: No  results for input(s): "VITAMINB12", "FOLATE", "FERRITIN", "TIBC", "IRON", "RETICCTPCT" in the last 72 hours. Urine analysis:    Component Value Date/Time   COLORURINE YELLOW 09/25/2020 2131   APPEARANCEUR CLEAR 09/25/2020 2131   LABSPEC 1.022 09/25/2020 2131   PHURINE 5.0 09/25/2020 2131   GLUCOSEU >=500 (A) 09/25/2020 2131   GLUCOSEU NEGATIVE 07/20/2014 1055   HGBUR NEGATIVE 09/25/2020 2131   HGBUR negative 08/28/2008 1446   BILIRUBINUR NEGATIVE 09/25/2020 2131   BILIRUBINUR Neg 10/28/2014 0916   KETONESUR NEGATIVE 09/25/2020 2131   PROTEINUR NEGATIVE 09/25/2020 2131   UROBILINOGEN 0.2 10/28/2014 0916   UROBILINOGEN 0.2 07/20/2014 1055   NITRITE NEGATIVE 09/25/2020 2131   LEUKOCYTESUR SMALL (A) 09/25/2020 2131   Sepsis Labs: @LABRCNTIP (procalcitonin:4,lacticidven:4)  ) Recent Results (from the past 240 hour(s))  Resp panel by RT-PCR (RSV, Flu A&B, Covid) Anterior Nasal Swab     Status: None   Collection Time: 02/27/23  5:30 PM   Specimen: Anterior Nasal Swab  Result Value Ref Range Status   SARS Coronavirus 2 by RT PCR NEGATIVE NEGATIVE Final   Influenza A by PCR NEGATIVE NEGATIVE Final  Influenza B by PCR NEGATIVE NEGATIVE Final    Comment: (NOTE) The Xpert Xpress SARS-CoV-2/FLU/RSV plus assay is intended as an aid in the diagnosis of influenza from Nasopharyngeal swab specimens and should not be used as a sole basis for treatment. Nasal washings and aspirates are unacceptable for Xpert Xpress SARS-CoV-2/FLU/RSV testing.  Fact Sheet for Patients: BloggerCourse.com  Fact Sheet for Healthcare Providers: SeriousBroker.it  This test is not yet approved or cleared by the [place] FDA and has been authorized for detection and/or diagnosis of SARS-CoV-2 by FDA under an Emergency Use Authorization (EUA). This EUA will remain in effect (meaning this test can be used) for the duration of the COVID-19 declaration under  Section 564(b)(1) of the Act, 21 U.S.C. section 360bbb-3(b)(1), unless the authorization is terminated or revoked.     Resp Syncytial Virus by PCR NEGATIVE NEGATIVE Final    Comment: (NOTE) Fact Sheet for Patients: BloggerCourse.com  Fact Sheet for Healthcare Providers: SeriousBroker.it  This test is not yet approved or cleared by the [place] FDA and has been authorized for detection and/or diagnosis of SARS-CoV-2 by FDA under an Emergency Use Authorization (EUA). This EUA will remain in effect (meaning this test can be used) for the duration of the COVID-19 declaration under Section 564(b)(1) of the Act, 21 U.S.C. section 360bbb-3(b)(1), unless the authorization is terminated or revoked.  Performed at [facility], [address], [place], [place] [address]   MRSA Next Gen by PCR, Nasal     Status: None   Collection Time: 02/28/23  1:42 AM   Specimen: Nasal Mucosa; Nasal Swab  Result Value Ref Range Status   MRSA by PCR Next Gen NOT DETECTED NOT DETECTED Final    Comment: (NOTE) The GeneXpert MRSA Assay (FDA approved for NASAL specimens only), is one component of a comprehensive MRSA colonization surveillance program. It is not intended to diagnose MRSA infection nor to guide or monitor treatment for MRSA infections. Test performance is not FDA approved in patients less than 36 years old. Performed at [facility], [address] [place], [place] [address]      Radiology Studies: No results found.   Scheduled Meds:  allopurinol  50 mg Oral Daily   apixaban  5 mg Oral BID   feeding supplement  237 mL Oral BID BM   gabapentin  100 mg Oral QHS   influenza vaccine adjuvanted  0.5 mL Intramuscular Tomorrow-1000   midodrine  5 mg Oral TID WC   polyethylene glycol  17 g Oral Daily   rosuvastatin  5 mg Oral Daily   Tafamidis  61 mg Oral Daily   Continuous Infusions:  furosemide Stopped (03/08/23 1017)      LOS: 8 days    Time spent: 35mi     Zannie Cove, MD [facility]   03/08/2023, 12:36 PM

## 2023-03-08 NOTE — Progress Notes (Signed)
[facility] Progress Note   77 y.o. male combined HFrEF EF 20-25%, DM, amyloidosis, pericardial window 06/2020, afib, CASHD, HTN, OSA, prostate cancer, seizure only after the CVA in 2014, gout CKD4 with a baseline cr between 2.7-3.1 followed by Dr. Vallery Sa p/w SOB, leg swelling. He had a failed left BCF which is thrombosed and was sent from cardiology clinic on 10/16 because of worsening dyspnea, orthopnea having gained 10lbs of weight. He was noted to have rales bilaterally in the ED with CXR showing congestion.    Assessment/ Plan:   Renal failure with h/o CKDIV with a baseline creatinine in the 2.7-3.1 range and h/o failed left BCF (placed 01/31/22). Had been putting out usually >2L UOP/24hr range but over the past 24hrs that has dropped to 1.4L. Creatinine has slowly increasing over the past few days. Usually would hold diuretics when renal function trends the wrong direction but he is markedly volume overloaded. Weight was 144.8 lbs with minimal edema noted on 11/20/22. On 08/22/2022 he was 168lbs but noted to have 1+ edema in the lower ext at that time. Currently he is ~180lbs.  - He has no emergent need for HD and fortunately less dyspneic w/ great UOP and slowly improving renal function.  - Continue Lasix 120mg  q6hrs (started on 10/22) for one more day and then transition to oral.  - Floor scale only if possible in the AM after voiding and before eating. Should be 144-150 lbs in that range (144.8lb on 11/20/22). Down to 173 lbs.  -Maintain MAP>65 for optimal renal perfusion.  - Avoid nephrotoxic medications including NSAIDs and iodinated intravenous contrast exposure unless the latter is absolutely indicated.   - Preferred narcotic agents for pain control are hydromorphone, fentanyl, and methadone. Morphine should not be used.  - Avoid Baclofen and avoid oral sodium phosphate and magnesium citrate based laxatives / bowel preps.  - Continue strict Input and Output monitoring.  Will monitor the patient closely with you and intervene or adjust therapy as indicated by changes in clinical status/labs   CHF - still appears to be decompensated and needs to continue diuresis HTN Secondary hyperparathyroidism - will check a P and iPTH Cardiac amyloidosis seen by cardiology clinic Proteinuria - secondary to DM vs. amyloid; subnephrotic - off of ARB and jardiance with advanced CKD and anticipate need for HD soon DM - per primary team  Subjective:   Feels his breathing is better. Denies f/c/n/v. Wife not here this am.   Objective:   BP 103/69 (BP Location: Right Arm)   Pulse 64   Temp 98.7 F (37.1 C) (Oral)   Resp 19   Ht 5\' 6"  (1.676 m)   Wt 78.4 kg   SpO2 94%   BMI 27.90 kg/m   Intake/Output Summary (Last 24 hours) at 03/08/2023 1019 Last data filed at 03/08/2023 1610 Gross per 24 hour  Intake 1030.03 ml  Output 3125 ml  Net -2094.97 ml   Weight change: -1.302 kg  Physical Exam: Gen - Adult male seated in NAD; less labored breathing today, able to speak in full sentences HEENT - NCAT/EOMI; sclera anicteric Neck - supple Cardiovascular - S1S2; no rub; edema bilateral to thighs, PM/Defib right chest wall Chest/Lungs - poor air movement, rales present Abdomen - soft/NT/ND Psych normal mood and affect Skin - no rash; no lesions on exposed areas Access: LUE AVF no bruit or thrill  Imaging: No results found.  Labs: BMET Recent Labs  Lab 03/02/23 1218 03/03/23 0216 03/04/23 0216  03/05/23 0219 03/06/23 0232 03/07/23 0214 03/08/23 0220  NA 138 137 136 136 136 133* 137  K 4.7 4.7 4.4 4.4 4.5 4.1 3.9  CL 100 98 97* 97* 96* 93* 93*  CO2 25 25 26 27 30 29  32  GLUCOSE 135* 103* 85 143* 142* 104* 97  BUN 69* 68* 73* 86* 87* 89* 89*  CREATININE 3.14* 3.09* 2.92* 3.16* 3.30* 2.89* 2.86*  CALCIUM 9.4 9.9 9.5 9.2 9.1 8.9 9.1  PHOS  --   --   --   --   --  3.3  --    CBC Recent Labs  Lab 03/05/23 0219 03/06/23 0232  WBC 2.8* 3.1*  HGB 11.2*  10.9*  HCT 36.9* 36.1*  MCV 82.6 83.2  PLT 133* 162    Medications:     allopurinol  50 mg Oral Daily   apixaban  5 mg Oral BID   feeding supplement  237 mL Oral BID BM   gabapentin  100 mg Oral QHS   influenza vaccine adjuvanted  0.5 mL Intramuscular Tomorrow-1000   midodrine  5 mg Oral TID WC   polyethylene glycol  17 g Oral Daily   rosuvastatin  5 mg Oral Daily   Tafamidis  61 mg Oral Daily      Paulene Floor, MD 03/08/2023, 10:19 AM

## 2023-03-08 NOTE — Progress Notes (Signed)
Inpatient Rehab Admissions Coordinator:   Following for my colleague Megan Salon.  Awaiting insurance determination.  I will not have a bed for this patient to admit today.   Estill Dooms, PT, DPT Admissions Coordinator [phone] 03/08/23  10:02 AM

## 2023-03-09 DIAGNOSIS — I5023 Acute on chronic systolic (congestive) heart failure: Secondary | ICD-10-CM | POA: Diagnosis not present

## 2023-03-09 LAB — BASIC METABOLIC PANEL
Anion gap: 12 (ref 5–15)
BUN: 92 mg/dL — ABNORMAL HIGH (ref 8–23)
CO2: 36 mmol/L — ABNORMAL HIGH (ref 22–32)
Calcium: 9 mg/dL (ref 8.9–10.3)
Chloride: 90 mmol/L — ABNORMAL LOW (ref 98–111)
Creatinine, Ser: 2.98 mg/dL — ABNORMAL HIGH (ref 0.61–1.24)
GFR, Estimated: 21 mL/min — ABNORMAL LOW (ref >60)
Glucose, Bld: 114 mg/dL — ABNORMAL HIGH (ref 70–99)
Potassium: 3.7 mmol/L (ref 3.5–5.1)
Sodium: 138 mmol/L (ref 135–145)

## 2023-03-09 LAB — GLUCOSE, CAPILLARY
Glucose-Capillary: 140 mg/dL — ABNORMAL HIGH (ref 70–99)
Glucose-Capillary: 147 mg/dL — ABNORMAL HIGH (ref 70–99)
Glucose-Capillary: 153 mg/dL — ABNORMAL HIGH (ref 70–99)
Glucose-Capillary: 174 mg/dL — ABNORMAL HIGH (ref 70–99)
Glucose-Capillary: 215 mg/dL — ABNORMAL HIGH (ref 70–99)
Glucose-Capillary: 255 mg/dL — ABNORMAL HIGH (ref 70–99)
Glucose-Capillary: 74 mg/dL (ref 70–99)

## 2023-03-09 LAB — MAGNESIUM: Magnesium: 2.7 mg/dL — ABNORMAL HIGH (ref 1.7–2.4)

## 2023-03-09 MED ORDER — TAMSULOSIN HCL 0.4 MG PO CAPS: 0.4000 mg | ORAL_CAPSULE | Freq: Every day | ORAL | Status: DC

## 2023-03-09 MED ORDER — FINASTERIDE 5 MG PO TABS
5.0000 mg | ORAL_TABLET | Freq: Every day | ORAL | Status: DC
  Administered 2023-03-09: 5 mg via ORAL
  Filled 2023-03-09: qty 1

## 2023-03-09 NOTE — Progress Notes (Signed)
Mobility Specialist Progress Note:   03/09/23 0900  Mobility  Activity Transferred from bed to chair  Level of Assistance Moderate assist, patient does 50-74%  Assistive Device Other (Comment) (HHA/Gait Belt)  Distance Ambulated (ft) 4 ft  Activity Response Tolerated well  Mobility Referral Yes  $Mobility charge 1 Mobility  Mobility Specialist Start Time (ACUTE ONLY) [id number]  Mobility Specialist Stop Time (ACUTE ONLY) [id number]  Mobility Specialist Time Calculation (min) (ACUTE ONLY) 13 min    Pt received in bed, agreeable to mobility. Pt held onto textile gait belt w/ HHA/ModA to transfer. Asymptomatic throughout w/ no complaints. Pt left in chair with call bell and NT present.  D'Vante Earlene Plater Mobility Specialist Please contact via Special educational needs teacher or Rehab office at [phone]

## 2023-03-09 NOTE — Progress Notes (Signed)
Inpatient Rehab Admissions Coordinator:    Pt.'s case for CIR was denied by his insurance. He wishes to appeal case. I think it would also be a good idea for TOC to work Pt. Up for SNF as a back up in case appeal fails.   Megan Salon, MS, CCC-SLP Rehab Admissions Coordinator  [phone] (celll) [phone] (office)

## 2023-03-09 NOTE — [place] FL2 (Signed)
MEDICAID FL2 LEVEL OF CARE FORM     IDENTIFICATION  Patient Name: Corey [name] Birthdate: October 23, 1945 Sex: male Admission Date (Current Location): 02/27/2023  [facility] and [place] Number:  Producer, television/film/video and Address:  [facility] [place], [address] [place], [place] [address]      Provider Number: [id number]  Attending Physician Name and Address:  Zannie Cove, MD  Relative Name and Phone Number:       Current Level of Care: Hospital Recommended Level of Care: Skilled Nursing Facility Prior Approval Number:    Date Approved/Denied:   PASRR Number: [id number]  Discharge Plan: SNF    Current Diagnoses: Patient Active Problem List   Diagnosis Date Noted   Acute on chronic systolic CHF (congestive heart failure) (HCC) 02/28/2023   Acute renal failure superimposed on stage 4 chronic kidney disease (HCC) 02/28/2023   Elevated d-dimer 02/28/2023   Hypoglycemia 02/28/2023   Right shoulder pain 02/28/2023   Generalized weakness 02/28/2023   Acute heart failure with reduced ejection fraction and diastolic dysfunction (HCC) 12/26/2022   Symptomatic bradycardia 12/26/2022   Atrial flutter (HCC) 07/24/2022   Bilateral low back pain without sciatica 04/10/2022   Acute on chronic congestive heart failure (HCC) 03/25/2022   Acute on chronic combined systolic and diastolic heart failure (HCC) 03/23/2022   Paroxysmal atrial fibrillation (HCC) 01/12/2022   Acute cough 01/05/2022   Bilateral edema of lower extremity 01/05/2022   Leg wound, right, initial encounter 10/24/2021   Weakness of both lower extremities 10/24/2021   Chronic kidney disease (CKD), stage IV (severe) (HCC) 09/29/2021   Pure hypercholesterolemia 07/05/2021   Preoperative cardiovascular examination 04/04/2021   HFrEF (heart failure with reduced ejection fraction) (HCC) 04/03/2021   Bilateral hearing loss 01/04/2021   Bilateral impacted cerumen 01/04/2021   Tympanic  membrane perforation, left 01/04/2021   Physical deconditioning 10/16/2020   Hereditary cardiac amyloidosis (HCC) 09/30/2020   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   Progressive angina (HCC) - Atypical; DOE 09/20/2020   CAD (coronary artery disease) 09/20/2020   Soft tissue mass 07/30/2020   Contracture of joint of finger of left hand 07/30/2020   DOE (dyspnea on exertion) 03/01/2019   Mild cognitive impairment 12/05/2018   Muscle twitching 07/23/2018   OSA (obstructive sleep apnea) 03/27/2018   Snores 01/21/2018   Poor balance 01/23/2017   Ankle swelling, left 09/06/2016   Bradycardia 01/28/2016   Weak urinary stream 12/31/2015   Ventral hernia without obstruction or gangrene 09/15/2015   Prostate cancer (HCC) 07/25/2015   Erectile dysfunction 05/04/2015   Urinary frequency 05/04/2015   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   Abnormal finding on MRI of brain 02/14/2015   Leg wound, right 01/08/2013   PEA (Pulseless electrical activity) (HCC) 12/03/2012   Acute hypoxemic respiratory failure (HCC) 09/30/2012   Non-insulin dependent type 2 diabetes mellitus (HCC) 06/30/2011   Gout 03/05/2007   Essential hypertension 03/05/2007    Orientation RESPIRATION BLADDER Height & Weight     Self, Situation, Place  O2 ([place] 4L) Indwelling catheter Weight: 164 lb 7.4 oz (74.6 kg) Height:  5\' 6"  (167.6 cm)  BEHAVIORAL SYMPTOMS/MOOD NEUROLOGICAL BOWEL NUTRITION STATUS      Continent Diet (See DC summary)  AMBULATORY STATUS COMMUNICATION OF NEEDS Skin   Extensive Assist Verbally Other (Comment) (Bilateral pretibial venoustasis ulcers, MASD buttocks)                       Personal  Care Assistance Level of Assistance  Bathing, Feeding, Dressing Bathing Assistance: Maximum assistance Feeding assistance: Limited assistance Dressing Assistance: Maximum assistance     Functional Limitations Info  Sight, Hearing, Speech Sight Info: Impaired Hearing Info:  Impaired Speech Info: Adequate    SPECIAL CARE FACTORS FREQUENCY  PT (By licensed PT), OT (By licensed OT)     PT Frequency: 5x week OT Frequency: 5x week            Contractures Contractures Info: Not present    Additional Factors Info  Code Status, Allergies Code Status Info: Full Allergies Info: Hydrochlorothiazide.           Current Medications (03/09/2023):  This is the current hospital active medication list Current Facility-Administered Medications  Medication Dose Route Frequency Provider Last Rate Last Admin   acetaminophen (TYLENOL) tablet 650 mg  650 mg Oral Q6H PRN John Giovanni, MD   650 mg at 03/08/23 2151   Or   acetaminophen (TYLENOL) suppository 650 mg  650 mg Rectal Q6H PRN John Giovanni, MD       albuterol (PROVENTIL) (2.5 MG/3ML) 0.083% nebulizer solution 2.5 mg  2.5 mg Nebulization Q4H PRN Arrien, York Ram, MD   2.5 mg at 03/01/23 1749   allopurinol (ZYLOPRIM) tablet 50 mg  50 mg Oral Daily John Giovanni, MD   50 mg at 03/09/23 0901   apixaban (ELIQUIS) tablet 5 mg  5 mg Oral BID Coralie Keens, MD   5 mg at 03/09/23 0901   dextrose 50 % solution 50 mL  1 ampule Intravenous PRN John Giovanni, MD   50 mL at 02/28/23 0213   feeding supplement (ENSURE ENLIVE / ENSURE PLUS) liquid 237 mL  237 mL Oral BID BM Arrien, York Ram, MD   237 mL at 03/09/23 0901   finasteride (PROSCAR) tablet 5 mg  5 mg Oral Daily Zannie Cove, MD   5 mg at 03/09/23 0901   gabapentin (NEURONTIN) capsule 100 mg  100 mg Oral QHS Zannie Cove, MD   100 mg at 03/08/23 2147   guaiFENesin-dextromethorphan (ROBITUSSIN DM) 100-10 MG/5ML syrup 5 mL  5 mL Oral Q4H PRN Arrien, York Ram, MD   5 mL at 03/01/23 5621   influenza vaccine adjuvanted (FLUAD) injection 0.5 mL  0.5 mL Intramuscular Tomorrow-1000 Arrien, York Ram, MD       midodrine (PROAMATINE) tablet 5 mg  5 mg Oral TID WC Arrien, York Ram, MD   5 mg at 03/09/23 1119    polyethylene glycol (MIRALAX / GLYCOLAX) packet 17 g  17 g Oral Daily Coralie Keens, MD   17 g at 03/09/23 0901   rosuvastatin (CRESTOR) tablet 5 mg  5 mg Oral Daily John Giovanni, MD   5 mg at 03/09/23 0901   Tafamidis CAPS 61 mg  61 mg Oral Daily Arrien, York Ram, MD   61 mg at 03/09/23 3086     Discharge Medications: Please see discharge summary for a list of discharge medications.  Relevant Imaging Results:  Relevant Lab Results:   Additional Information SS# [id number], [place]

## 2023-03-09 NOTE — Progress Notes (Signed)
[facility] Progress Note   77 y.o. male combined HFrEF EF 20-25%, DM, amyloidosis, pericardial window 06/2020, afib, CASHD, HTN, OSA, prostate cancer, seizure only after the CVA in 2014, gout CKD4 with a baseline cr between 2.7-3.1 followed by Dr. Vallery Sa p/w SOB, leg swelling. He had a failed left BCF which is thrombosed and was sent from cardiology clinic on 10/16 because of worsening dyspnea, orthopnea having gained 10lbs of weight. He was noted to have rales bilaterally in the ED with CXR showing congestion.    Assessment/ Plan:   Renal failure with h/o CKDIV with a baseline creatinine in the 2.7-3.1 range and h/o failed left BCF (placed 01/31/22). Had been putting out usually >2L UOP/24hr range but over the past 24hrs that has dropped to 1.4L. Creatinine has slowly increasing over the past few days. Usually would hold diuretics when renal function trends the wrong direction but he is markedly volume overloaded. Weight was 144.8 lbs with minimal edema noted on 11/20/22. On 08/22/2022 he was 168lbs but noted to have 1+ edema in the lower ext at that time. Currently he is ~180lbs.  - He has no emergent need for HD and fortunately less dyspneic w/ great UOP and slowly improving renal function.  - Still has fluid onboard but with contraction alkalosis + worsening renal function will stop the  Lasix 120mg  q6hrs (started on 10/22) -> transition to Demadex 40/20 starting 10/26. He was on 20/20 at home and appears to require more; he states that he was compliant with his meds.  Signing off at this time; please reconsult as needed.  - Floor scale only if possible in the AM after voiding and before eating. Should be 144-150 lbs in that range (144.8lb on 11/20/22). Down to 164 lbs (from 191 lbs at admission).   -Maintain MAP>65 for optimal renal perfusion.  - Avoid nephrotoxic medications including NSAIDs and iodinated intravenous contrast exposure unless the latter is absolutely indicated.    - Preferred narcotic agents for pain control are hydromorphone, fentanyl, and methadone. Morphine should not be used.  - Avoid Baclofen and avoid oral sodium phosphate and magnesium citrate based laxatives / bowel preps.  - Continue strict Input and Output monitoring. Will monitor the patient closely with you and intervene or adjust therapy as indicated by changes in clinical status/labs   CHF - still appears to be decompensated and needs to continue diuresis HTN Secondary hyperparathyroidism - will check a P and iPTH Cardiac amyloidosis seen by cardiology clinic Proteinuria - secondary to DM vs. amyloid; subnephrotic - off of ARB and jardiance with advanced CKD and anticipate need for HD soon DM - per primary team  Subjective:   Feels his breathing is better. Denies f/c/n/v. Wife not here this am. Main complaint is discomfort in the bottom of his feet.   Objective:   BP 118/77   Pulse 60   Temp 97.8 F (36.6 C) (Oral)   Resp 20   Ht 5\' 6"  (1.676 m)   Wt 74.6 kg   SpO2 100%   BMI 26.55 kg/m   Intake/Output Summary (Last 24 hours) at 03/09/2023 1103 Last data filed at 03/09/2023 1036 Gross per 24 hour  Intake 429.97 ml  Output 3880 ml  Net -3450.03 ml   Weight change: -3.8 kg  Physical Exam: Gen - Adult male seated in NAD; less labored breathing today, able to speak in full sentences HEENT - NCAT/EOMI; sclera anicteric Neck - supple Cardiovascular - S1S2; no rub; edema bilateral  to thighs (but much less), PM/Defib right chest wall Chest/Lungs - poor air movement, rales present Abdomen - soft/NT/ND Psych normal mood and affect Skin - no rash; no lesions on exposed areas Access: LUE AVF no bruit or thrill  Imaging: No results found.  Labs: BMET Recent Labs  Lab 03/03/23 0216 03/04/23 0216 03/05/23 1610 03/06/23 0232 03/07/23 0214 03/08/23 0220 03/09/23 0219  NA 137 136 136 136 133* 137 138  K 4.7 4.4 4.4 4.5 4.1 3.9 3.7  CL 98 97* 97* 96* 93* 93* 90*   CO2 25 26 27 30 29  32 36*  GLUCOSE 103* 85 143* 142* 104* 97 114*  BUN 68* 73* 86* 87* 89* 89* 92*  CREATININE 3.09* 2.92* 3.16* 3.30* 2.89* 2.86* 2.98*  CALCIUM 9.9 9.5 9.2 9.1 8.9 9.1 9.0  PHOS  --   --   --   --  3.3  --   --    CBC Recent Labs  Lab 03/05/23 0219 03/06/23 0232  WBC 2.8* 3.1*  HGB 11.2* 10.9*  HCT 36.9* 36.1*  MCV 82.6 83.2  PLT 133* 162    Medications:     allopurinol  50 mg Oral Daily   apixaban  5 mg Oral BID   feeding supplement  237 mL Oral BID BM   finasteride  5 mg Oral Daily   gabapentin  100 mg Oral QHS   influenza vaccine adjuvanted  0.5 mL Intramuscular Tomorrow-1000   midodrine  5 mg Oral TID WC   polyethylene glycol  17 g Oral Daily   rosuvastatin  5 mg Oral Daily   Tafamidis  61 mg Oral Daily      Paulene Floor, MD 03/09/2023, 11:03 AM

## 2023-03-09 NOTE — Progress Notes (Signed)
PROGRESS NOTE    Corey [name]  UJW:[id number] DOB: 1945-05-17 DOA: 02/27/2023 PCP: Pincus Sanes, MD  76/M w cardiac amyloidosis, heart failure, pericardial effusion (sp pericardial window), atrial fibrillation, CVA, CKD stage IV, T2DM and prior cardiac arrest (PEA), who presented with lower extremity edema and dyspnea for about 2 weeks, associated with orthopnea, cough, and 10 lbs weight gain. On the day of hospitalization he was evaluated at the cardiology clinic, pre op evaluation for HD access, he was found volume overloaded and was referred to the ED for further evaluation.  Na 141, bun 73, cr 3,14, hgb 11,6 plt 140  CXR w/ hypoinflation, cardiomegaly, bilateral hilar vascular congestion, small left pleural effusion, pacemaker with one right atrial and one right ventricular lead.   10/17 patient placed on IV furosemide, continue volume overloaded.  10/18 improved diuresis.  10/19 tolerating well IV furosemide, but continue volume overloaded.  10/21 volume status has improved. Vascular surgery recommended follow up as outpatient for AV fistula.  10/22 nephrology consulted, Lasix increased to 120 Mg every 6    Subjective: Feels a little better overall, breathing is improving, shakes are better as well, wondering about rehab options  Assessment and Plan:  Acute on chronic systolic CHF (congestive heart failure) (HCC) -Echo with EF 20 to 25%, global hypokinesis, severe LVH, RV with severe reduction in systolic function, RA with moderate dilatation,  -diuresed with IV Lasix, appreciate nephrology input, -He is 16.3 L negative, weight down 26 LB , now transitioning to oral torsemide tomorrow -Continue midodrine -GDMT limited by CKD 4 -CIR declined by insurance, appealed, TOC  Acute hypoxemic respiratory due to acute cardiogenic pulmonary edema.  -V/Q scan with no evidence of pulmonary embolism.  -Cardiac amyloidosis, continue with tafamidis.  -Follow-up with heart failure team  Acute  renal failure superimposed on stage 4 chronic kidney disease (HCC) -Baseline creatinine 2.8-3.3, creatinine peaked at 3.3, now improving -Appreciate nephrology input, Lasix as above -Midodrine, avoid hypotension -Has thrombosed left arm AV fistula, Dr. Ella Jubilee discussed with vascular surgery, they recommended outpatient follow-up as no plans for urgent renal replacement therapy at this time  Anemia of chronic renal disease, with hgb at 10,9 -Stable, has mild pancytopenia as well could be secondary to amyloidosis/tafamidis  Paroxysmal atrial fibrillation (HCC) 12/28/22 CRT pacemaker implantation, BiV pacing - anticoagulation with apixaban.   Essential hypertension -Midodrine and Lasix as above  CAD (coronary artery disease) No chest pain, no acute coronary syndrome.   Non-insulin dependent type 2 diabetes mellitus (HCC) Hypoglycemia.  -CBGs are stable  Peripheral neuropathy Continue gabapentin, dose decreased  Right shoulder pain Radiograph with no fractures.  Positive degenerative changes, acromiohumeral abutment consistent with chronic rotator cuff arthropathy and likely degenerative rotator cuff tear.   Prostate cancer (HCC) Follow up as outpatient.  Urinary frequency and discomfort, suspect some component of retention add finasteride  DVT prophylaxis: apixaban Code Status: Full Code Family Communication: No family at bedside Disposition Plan: CIR versus SNF  Consultants:    Procedures:   Antimicrobials:    Objective: Vitals:   03/09/23 0800 03/09/23 1000 03/09/23 1005 03/09/23 1100  BP: 118/77   118/88  Pulse: 60 60 60 (!) 59  Resp: (!) 23 (!) 27 20 17   Temp:    97.7 F (36.5 C)  TempSrc:    Oral  SpO2: 96% 98% 100% 100%  Weight:      Height:        Intake/Output Summary (Last 24 hours) at 03/09/2023 1158 Last data filed  at 03/09/2023 1036 Gross per 24 hour  Intake 429.97 ml  Output 3880 ml  Net -3450.03 ml   Filed Weights   03/07/23 0653  03/08/23 0700 03/09/23 0411  Weight: 79.7 kg 78.4 kg 74.6 kg    Examination:  Gen: Chronically ill male sitting up in bed, AAOx3 HEENT: + JVD CVS: S1-S2, regular rhythm Lungs: Decreased breath sounds to bases Abdomen: Soft, nontender, bowel sounds present Remedies: 1+ edema  Skin: no new rashes on exposed skin     Data Reviewed:   CBC: Recent Labs  Lab 03/05/23 0219 03/06/23 0232  WBC 2.8* 3.1*  HGB 11.2* 10.9*  HCT 36.9* 36.1*  MCV 82.6 83.2  PLT 133* 162   Basic Metabolic Panel: Recent Labs  Lab 03/02/23 1218 03/03/23 0216 03/04/23 0216 03/05/23 0219 03/06/23 0232 03/07/23 0214 03/08/23 0220 03/09/23 0219  NA 138 137   < > 136 136 133* 137 138  K 4.7 4.7   < > 4.4 4.5 4.1 3.9 3.7  CL 100 98   < > 97* 96* 93* 93* 90*  CO2 25 25   < > 27 30 29  32 36*  GLUCOSE 135* 103*   < > 143* 142* 104* 97 114*  BUN 69* 68*   < > 86* 87* 89* 89* 92*  CREATININE 3.14* 3.09*   < > 3.16* 3.30* 2.89* 2.86* 2.98*  CALCIUM 9.4 9.9   < > 9.2 9.1 8.9 9.1 9.0  MG 2.7* 2.7*  --   --   --  2.7*  --  2.7*  PHOS  --   --   --   --   --  3.3  --   --    < > = values in this interval not displayed.   GFR: Estimated Creatinine Clearance: 19.6 mL/min (A) (by C-G formula based on SCr of 2.98 mg/dL (H)). Liver Function Tests: Recent Labs  Lab 03/07/23 0214  ALBUMIN 2.4*   No results for input(s): "LIPASE", "AMYLASE" in the last 168 hours. Recent Labs  Lab 03/07/23 0736  AMMONIA 53*   Coagulation Profile: No results for input(s): "INR", "PROTIME" in the last 168 hours. Cardiac Enzymes: No results for input(s): "CKTOTAL", "CKMB", "CKMBINDEX", "TROPONINI" in the last 168 hours. BNP (last 3 results) No results for input(s): "PROBNP" in the last 8760 hours. HbA1C: No results for input(s): "HGBA1C" in the last 72 hours. CBG: Recent Labs  Lab 03/08/23 2045 03/09/23 0008 03/09/23 0415 03/09/23 0802 03/09/23 1121  GLUCAP 213* 140* 74 174* 255*   Lipid Profile: No results  for input(s): "CHOL", "HDL", "LDLCALC", "TRIG", "CHOLHDL", "LDLDIRECT" in the last 72 hours. Thyroid Function Tests: No results for input(s): "TSH", "T4TOTAL", "FREET4", "T3FREE", "THYROIDAB" in the last 72 hours. Anemia Panel: No results for input(s): "VITAMINB12", "FOLATE", "FERRITIN", "TIBC", "IRON", "RETICCTPCT" in the last 72 hours. Urine analysis:    Component Value Date/Time   COLORURINE YELLOW 03/08/2023 1745   APPEARANCEUR CLEAR 03/08/2023 1745   LABSPEC 1.008 03/08/2023 1745   PHURINE 5.0 03/08/2023 1745   GLUCOSEU NEGATIVE 03/08/2023 1745   GLUCOSEU NEGATIVE 07/20/2014 1055   HGBUR NEGATIVE 03/08/2023 1745   HGBUR negative 08/28/2008 1446   BILIRUBINUR NEGATIVE 03/08/2023 1745   BILIRUBINUR Neg 10/28/2014 0916   KETONESUR NEGATIVE 03/08/2023 1745   PROTEINUR NEGATIVE 03/08/2023 1745   UROBILINOGEN 0.2 10/28/2014 0916   UROBILINOGEN 0.2 07/20/2014 1055   NITRITE NEGATIVE 03/08/2023 1745   LEUKOCYTESUR NEGATIVE 03/08/2023 1745   Sepsis Labs: @LABRCNTIP (procalcitonin:4,lacticidven:4)  ) Recent Results (  from the past 240 hour(s))  Resp panel by RT-PCR (RSV, Flu A&B, Covid) Anterior Nasal Swab     Status: None   Collection Time: 02/27/23  5:30 PM   Specimen: Anterior Nasal Swab  Result Value Ref Range Status   SARS Coronavirus 2 by RT PCR NEGATIVE NEGATIVE Final   Influenza A by PCR NEGATIVE NEGATIVE Final   Influenza B by PCR NEGATIVE NEGATIVE Final    Comment: (NOTE) The Xpert Xpress SARS-CoV-2/FLU/RSV plus assay is intended as an aid in the diagnosis of influenza from Nasopharyngeal swab specimens and should not be used as a sole basis for treatment. Nasal washings and aspirates are unacceptable for Xpert Xpress SARS-CoV-2/FLU/RSV testing.  Fact Sheet for Patients: BloggerCourse.com  Fact Sheet for Healthcare Providers: SeriousBroker.it  This test is not yet approved or cleared by the [place] FDA  and has been authorized for detection and/or diagnosis of SARS-CoV-2 by FDA under an Emergency Use Authorization (EUA). This EUA will remain in effect (meaning this test can be used) for the duration of the COVID-19 declaration under Section 564(b)(1) of the Act, 21 U.S.C. section 360bbb-3(b)(1), unless the authorization is terminated or revoked.     Resp Syncytial Virus by PCR NEGATIVE NEGATIVE Final    Comment: (NOTE) Fact Sheet for Patients: BloggerCourse.com  Fact Sheet for Healthcare Providers: SeriousBroker.it  This test is not yet approved or cleared by the [place] FDA and has been authorized for detection and/or diagnosis of SARS-CoV-2 by FDA under an Emergency Use Authorization (EUA). This EUA will remain in effect (meaning this test can be used) for the duration of the COVID-19 declaration under Section 564(b)(1) of the Act, 21 U.S.C. section 360bbb-3(b)(1), unless the authorization is terminated or revoked.  Performed at [facility], [address] [place], [place] [address]   MRSA Next Gen by PCR, Nasal     Status: None   Collection Time: 02/28/23  1:42 AM   Specimen: Nasal Mucosa; Nasal Swab  Result Value Ref Range Status   MRSA by PCR Next Gen NOT DETECTED NOT DETECTED Final    Comment: (NOTE) The GeneXpert MRSA Assay (FDA approved for NASAL specimens only), is one component of a comprehensive MRSA colonization surveillance program. It is not intended to diagnose MRSA infection nor to guide or monitor treatment for MRSA infections. Test performance is not FDA approved in patients less than 33 years old. Performed at [facility], [address] [place], [place] [address]      Radiology Studies: No results found.   Scheduled Meds:  allopurinol  50 mg Oral Daily   apixaban  5 mg Oral BID   feeding supplement  237 mL Oral BID BM   finasteride  5 mg Oral Daily   gabapentin  100 mg Oral  QHS   influenza vaccine adjuvanted  0.5 mL Intramuscular Tomorrow-1000   midodrine  5 mg Oral TID WC   polyethylene glycol  17 g Oral Daily   rosuvastatin  5 mg Oral Daily   Tafamidis  61 mg Oral Daily   Continuous Infusions:     LOS: 9 days    Time spent: 35mi     Zannie Cove, MD [facility]   03/09/2023, 11:58 AM

## 2023-03-09 NOTE — Plan of Care (Signed)
Pt A/O x 3. Afib. 2-4 L [place]. Very weak. PT/OT x2 today. To/from [facility] x2. Needs assistance 2 people. Edema is getting better /per wife Was retaining urine, I/O was done for second time(difficult insertion. MD called and notified. Order to insert foley/done. Family at the bedside/updated. Bed in low position, alarms are on, call bell in reach.

## 2023-03-09 NOTE — Progress Notes (Signed)
Physical Therapy Treatment Patient Details Name: Corey [name] MRN: [id number] DOB: Mar 03, 1946 Today's Date: 03/09/2023   History of Present Illness Pt is a 77 y.o. male presenting 10/15 from cardiologist office with distended abdomen, BLE swelling and weeping. CXR showing mild pulmonary interstitial edema and L>R pleural effusions with adjacent airspace opacity which may reflect atelectasis or infection. Pt also with R shoulder pain post recent fall at home with radiograph with no fractures but positive for degenerative changes and consistent with chronic rotator cuff arthropathy and likely degenerative rotator cuff tear. PMH significant for cardiac amyloidosis (ATTR), HFrEF, PEA arrest, pericardial effusion, hypertension, hyperlipidemia, CKD stage IV, DMII, CVA, A-fib/flutter s/p CTI ablation March 2024 on Eliquis, s/p CRT placement August 2024, gout, prostate cancer, seizures.    PT Comments  Patient able to stand with less knee buckling on L noted, though some increased pain and localized edema noted on R knee.  Patient able to take steps with RW and mod A toward [facility], though maintained proximity to bed for safety due to knee buckling.  Reliant on O2 at 4 LPM noted desat when pt had removed nasal cannula.  PT will continue to follow.  Appropriate for inpatient rehab prior to d/c home.     If plan is discharge home, recommend the following: A little help with walking and/or transfers;Assistance with cooking/housework;Assist for transportation;Help with stairs or ramp for entrance;A little help with bathing/dressing/bathroom   Can travel by private vehicle        Equipment Recommendations  Other (comment) (TBA)    Recommendations for Other Services       Precautions / Restrictions Precautions Precautions: Fall Precaution Comments: knees buckling     Mobility  Bed Mobility Overal bed mobility: Needs Assistance Bed Mobility: Rolling Rolling: Mod assist   Supine to sit: Mod assist      General bed mobility comments: cues for technique and assist to lift trunk    Transfers Overall transfer level: Needs assistance Equipment used: Rolling walker (2 wheels), None Transfers: Sit to/from Stand Sit to Stand: Mod assist           General transfer comment: stood from EOB x 3 reps; first for practicing sit to stand to RW though knees buckling so removed walker and stood with L knee blocked with some lifting help, then to step up toward [facility] pt's arm over PT shoulder on L to allow PT to block knee, though pt continued to reach for RW and able to take small shuffling steps toward [facility] with RW with mod A for safety    Ambulation/Gait Ambulation/Gait assistance: Mod assist Gait Distance (Feet): 2 Feet Assistive device: Rolling walker (2 wheels) Gait Pattern/deviations: Step-to pattern, Decreased stride length, Shuffle, Narrow base of support       General Gait Details: assist for balance, for ensuring knees not buckling and for line management   Stairs             Wheelchair Mobility     Tilt Bed    Modified Rankin (Stroke Patients Only)       Balance Overall balance assessment: Needs assistance Sitting-balance support: Feet supported, No upper extremity supported Sitting balance-Leahy Scale: Fair     Standing balance support: Bilateral upper extremity supported, Reliant on assistive device for balance Standing balance-Leahy Scale: Poor                              Cognition Arousal: Alert  Behavior During Therapy: Flat affect Overall Cognitive Status: Impaired/Different from baseline Area of Impairment: Problem solving, Attention                   Current Attention Level: Sustained         Problem Solving: Difficulty sequencing, Requires verbal cues, Requires tactile cues General Comments: slow to respond to questions and commands today; flat affect and multimodal cues for directions        Exercises General Exercises -  Lower Extremity Ankle Circles/Pumps: AROM, Both, 10 reps, Supine Short Arc Quad: AROM, 10 reps, 5 reps, Left, Right (10 on L and 5 on R due to pain) Hip ABduction/ADduction: Strengthening, 10 reps, Both, Supine (hip adductor squeezes x 10 w/ 5 sec hold)    General Comments General comments (skin integrity, edema, etc.): wife present, on 4L O2 (initially pt had removed nasal cannula and noted SpO2 85%, back up to 94% on 4L O2)      Pertinent Vitals/Pain Pain Assessment Pain Assessment: Faces Faces Pain Scale: Hurts even more Pain Location: R knee Pain Descriptors / Indicators: Grimacing, Discomfort Pain Intervention(s): Monitored during session, Repositioned, Limited activity within patient's tolerance    Home Living                          Prior Function            PT Goals (current goals can now be found in the care plan section) Progress towards PT goals: Progressing toward goals (slowly)    Frequency    Min 1X/week      PT Plan      Co-evaluation              AM-PAC PT "6 Clicks" Mobility   Outcome Measure  Help needed turning from your back to your side while in a flat bed without using bedrails?: A Lot Help needed moving from lying on your back to sitting on the side of a flat bed without using bedrails?: A Lot Help needed moving to and from a bed to a chair (including a wheelchair)?: A Lot Help needed standing up from a chair using your arms (e.g., wheelchair or bedside chair)?: A Lot Help needed to walk in hospital room?: Total Help needed climbing 3-5 steps with a railing? : Total 6 Click Score: 10    End of Session Equipment Utilized During Treatment: Gait belt;Oxygen Activity Tolerance: Patient limited by fatigue Patient left: in bed;with call bell/phone within reach;with family/visitor present Nurse Communication: Other (comment) (placed on bed pan) PT Visit Diagnosis: Other abnormalities of gait and mobility (R26.89);Muscle weakness  (generalized) (M62.81);History of falling (Z91.81)     Time: 8469-6295 PT Time Calculation (min) (ACUTE ONLY): 31 min  Charges:    $Therapeutic Exercise: 8-22 mins $Therapeutic Activity: 8-22 mins PT General Charges $$ ACUTE PT VISIT: 1 Visit                     [name], PT Acute Rehabilitation Services Office:[phone] 03/09/2023    Corey [name] 03/09/2023, 4:57 PM

## 2023-03-09 NOTE — TOC Progression Note (Signed)
Transition of Care [facility]) - Progression Note    Patient Details  Name: Corey [name] MRN: [id number] Date of Birth: June 01, 1945  Transition of Care [facility]) CM/SW Contact  Carley Hammed, LCSW Phone Number: 03/09/2023, 1:20 PM  Clinical Narrative:    CSW was advised by CIR that pt's authorization went to appeal and a SNF workup would be appropriate as a backup. CSW spoke with spouse who states they are still wanting CIR and want to wait on appeal, but are agreeable to SNF as a backup. Pt has never been to facility before with no preference, agreeable to a fax out. Spouse states bed offers can be given to her if appeal is denied. Referral faxed out, TOC will continue to follow.    Expected Discharge Plan: Home w Home Health Services Barriers to Discharge: Continued Medical Work up  Expected Discharge Plan and Services       Living arrangements for the past 2 months: Single Family Home                           HH Arranged: PT, OT HH Agency: [facility] Date [facility] Agency Contacted: 03/03/23 Time HH Agency Contacted: 0840 Representative spoke with at [facility] Agency: Kandee Keen   Social Determinants of Health (SDOH) Interventions SDOH Screenings   Food Insecurity: No Food Insecurity (03/01/2023)  Housing: Low Risk  (03/01/2023)  Transportation Needs: No Transportation Needs (03/01/2023)  Utilities: Not At Risk (03/01/2023)  Alcohol Screen: Low Risk  (02/08/2023)  Depression (PHQ2-9): Low Risk  (02/08/2023)  Financial Resource Strain: Low Risk  (02/08/2023)  Physical Activity: Insufficiently Active (02/08/2023)  Social Connections: Socially Integrated (02/08/2023)  Stress: No Stress Concern Present (02/08/2023)  Tobacco Use: Medium Risk (02/27/2023)  Health Literacy: Adequate Health Literacy (02/08/2023)    Readmission Risk Interventions    09/30/2020    2:29 PM  Readmission Risk Prevention Plan  Transportation Screening Complete  PCP or Specialist Appt within 5-7 Days Complete   Home Care Screening Complete  Medication Review (RN CM) Complete

## 2023-03-10 DIAGNOSIS — I5023 Acute on chronic systolic (congestive) heart failure: Secondary | ICD-10-CM | POA: Diagnosis not present

## 2023-03-10 LAB — BASIC METABOLIC PANEL
Anion gap: 12 (ref 5–15)
BUN: 93 mg/dL — ABNORMAL HIGH (ref 8–23)
CO2: 37 mmol/L — ABNORMAL HIGH (ref 22–32)
Calcium: 9 mg/dL (ref 8.9–10.3)
Chloride: 88 mmol/L — ABNORMAL LOW (ref 98–111)
Creatinine, Ser: 2.91 mg/dL — ABNORMAL HIGH (ref 0.61–1.24)
GFR, Estimated: 22 mL/min — ABNORMAL LOW (ref >60)
Glucose, Bld: 121 mg/dL — ABNORMAL HIGH (ref 70–99)
Potassium: 3.6 mmol/L (ref 3.5–5.1)
Sodium: 137 mmol/L (ref 135–145)

## 2023-03-10 LAB — GLUCOSE, CAPILLARY
Glucose-Capillary: 85 mg/dL (ref 70–99)
Glucose-Capillary: 109 mg/dL — ABNORMAL HIGH (ref 70–99)
Glucose-Capillary: 131 mg/dL — ABNORMAL HIGH (ref 70–99)
Glucose-Capillary: 270 mg/dL — ABNORMAL HIGH (ref 70–99)
Glucose-Capillary: 327 mg/dL — ABNORMAL HIGH (ref 70–99)
Glucose-Capillary: 278 mg/dL — ABNORMAL HIGH (ref 70–99)

## 2023-03-10 LAB — URIC ACID: Uric Acid, Serum: 10.4 mg/dL — ABNORMAL HIGH (ref 3.7–8.6)

## 2023-03-10 MED ORDER — PREDNISONE 20 MG PO TABS
40.0000 mg | ORAL_TABLET | Freq: Every day | ORAL | Status: DC
  Administered 2023-03-10 – 2023-03-12 (×3): 40 mg via ORAL
  Filled 2023-03-10 (×3): qty 2

## 2023-03-10 MED ORDER — CHLORHEXIDINE GLUCONATE CLOTH 2 % EX PADS
6.0000 | MEDICATED_PAD | Freq: Every day | CUTANEOUS | Status: DC
  Administered 2023-03-09 – 2023-03-13 (×5): 6 via TOPICAL

## 2023-03-10 MED ORDER — PROPRANOLOL HCL ER 60 MG PO CP24
60.0000 mg | ORAL_CAPSULE | Freq: Every day | ORAL | Status: DC
  Administered 2023-03-10 – 2023-03-13 (×4): 60 mg via ORAL
  Filled 2023-03-10 (×4): qty 1

## 2023-03-10 MED ORDER — TORSEMIDE 20 MG PO TABS
40.0000 mg | ORAL_TABLET | Freq: Every day | ORAL | Status: DC
Start: 2023-03-10 — End: 2023-03-12
  Administered 2023-03-10 – 2023-03-11 (×2): 40 mg via ORAL
  Filled 2023-03-10 (×2): qty 2

## 2023-03-10 NOTE — Progress Notes (Signed)
Mobility Specialist Progress Note:   03/10/23 0941  Mobility  Activity Refused mobility    Pt refused mobility d/t gout pain in LE. Will f/u as able.    Leory Plowman  Mobility Specialist Please contact via [employer] office at [phone]

## 2023-03-10 NOTE — Progress Notes (Signed)
Attempted to transfer pt from bed to chair, patient had difficulty making steps dt having extensive  tremors making it unsafe.  Will try again this AM. Care ongoing.

## 2023-03-10 NOTE — Progress Notes (Addendum)
PROGRESS NOTE    BATTAL KETTERING  XBJ:[id number] DOB: August 26, 1945 DOA: 02/27/2023 PCP: Pincus Sanes, MD  76/M w cardiac amyloidosis, heart failure, pericardial effusion (sp pericardial window), atrial fibrillation, CVA, CKD stage IV, T2DM and prior cardiac arrest (PEA), who presented with lower extremity edema and dyspnea for about 2 weeks, associated with orthopnea, cough, and 10 lbs weight gain. On the day of hospitalization he was evaluated at the cardiology clinic, pre op evaluation for HD access, he was found volume overloaded and was referred to the ED for further evaluation.  Na 141, bun 73, cr 3,14, hgb 11,6 plt 140  CXR w/ hypoinflation, cardiomegaly, bilateral hilar vascular congestion, small left pleural effusion, pacemaker with one right atrial and one right ventricular lead.   10/17 patient placed on IV furosemide, continue volume overloaded.  10/18 improved diuresis.  10/19 tolerating well IV furosemide, but continue volume overloaded.  10/21 volume status has improved. Vascular surgery recommended follow up as outpatient for AV fistula.  10/22 nephrology consulted, Lasix increased to 120 Mg every 6 10/25: Diuretics changed to torsemide, gout flare, declined by CIR 10/26,-prednisone for gout flare    Subjective: some shakes of both upper extremities, worse on the left, right knee pain and swelling  Assessment and Plan:  Acute on chronic systolic CHF (congestive heart failure) (HCC) -Echo with EF 20 to 25%, global hypokinesis, severe LVH, RV with severe reduction in systolic function, RA with moderate dilatation,  -diuresed with IV Lasix, appreciate nephrology input, -He is 18.6 L negative, weight down 29 LB, creatinine stable at 2.9 now transition to oral torsemide to start today -Continue midodrine -GDMT limited by CKD 4 -CIR declined by insurance, appealed, TOC following, SNF as backup  Acute hypoxemic respiratory due to acute cardiogenic pulmonary edema.  -V/Q scan with  no evidence of pulmonary embolism.  -Cardiac amyloidosis, continue with tafamidis.  -Follow-up with heart failure team  Acute renal failure superimposed on stage 4 chronic kidney disease (HCC) -Baseline creatinine 2.8-3.3, creatinine peaked at 3.3, now improving -Appreciate nephrology input, 1 status improved, now on torsemide -Midodrine, avoid hypotension -Has thrombosed left arm AV fistula, Dr. Ella Jubilee discussed with vascular surgery, they recommended outpatient follow-up as no plans for urgent renal replacement therapy at this time  Anemia of chronic renal disease, with hgb at 10,9 -Stable, has mild pancytopenia as well could be secondary to amyloidosis/tafamidis  Paroxysmal atrial fibrillation (HCC) 12/28/22 CRT pacemaker implantation, BiV pacing - anticoagulation with apixaban.   Essential hypertension -Midodrine and Lasix as above  CAD (coronary artery disease) No chest pain, no acute coronary syndrome.   Non-insulin dependent type 2 diabetes mellitus (HCC) Hypoglycemia.  -CBGs are stable  Peripheral neuropathy -Gabapentin dose decreased few days ago, now discontinued on account of tremors and?  Myoclonus  Significant tremors -See above, trial of propranolol  Gout flare, right knee pain -Trial of prednisone, uric acid elevated  Right shoulder pain Radiograph with no fractures.  Positive degenerative changes, acromiohumeral abutment consistent with chronic rotator cuff arthropathy and likely degenerative rotator cuff tear.   Prostate cancer (HCC) Follow up as outpatient.  Urinary frequency and discomfort, suspect some component of retention add finasteride  DVT prophylaxis: apixaban Code Status: Full Code Family Communication: No family at bedside Disposition Plan: CIR versus SNF  Consultants:    Procedures:   Antimicrobials:    Objective: Vitals:   03/10/23 0323 03/10/23 0631 03/10/23 0736 03/10/23 1140  BP: 117/82 118/88 106/80 104/74  Pulse: (!) 59   65  Resp: 17 18 16 18   Temp: 98.5 F (36.9 C) 97.7 F (36.5 C) 99.2 F (37.3 C) 99.6 F (37.6 C)  TempSrc: Oral  Axillary Axillary  SpO2: 95%  100%   Weight:  73.5 kg    Height:        Intake/Output Summary (Last 24 hours) at 03/10/2023 1252 Last data filed at 03/10/2023 1147 Gross per 24 hour  Intake 240 ml  Output 2620 ml  Net -2380 ml   Filed Weights   03/08/23 0700 03/09/23 0411 03/10/23 0631  Weight: 78.4 kg 74.6 kg 73.5 kg    Examination:  Gen: Chronically ill male sitting up in bed, AAOx3 HEENT: + JVD CVS: S1-S2, regular rhythm Lungs: Decreased breath sounds to bases Abdomen: Soft, nontender, bowel sounds present Remedies: 1+ edema  Skin: no new rashes on exposed skin     Data Reviewed:   CBC: Recent Labs  Lab 03/05/23 0219 03/06/23 0232  WBC 2.8* 3.1*  HGB 11.2* 10.9*  HCT 36.9* 36.1*  MCV 82.6 83.2  PLT 133* 162   Basic Metabolic Panel: Recent Labs  Lab 03/06/23 0232 03/07/23 0214 03/08/23 0220 03/09/23 0219 03/10/23 0216  NA 136 133* 137 138 137  K 4.5 4.1 3.9 3.7 3.6  CL 96* 93* 93* 90* 88*  CO2 30 29 32 36* 37*  GLUCOSE 142* 104* 97 114* 121*  BUN 87* 89* 89* 92* 93*  CREATININE 3.30* 2.89* 2.86* 2.98* 2.91*  CALCIUM 9.1 8.9 9.1 9.0 9.0  MG  --  2.7*  --  2.7*  --   PHOS  --  3.3  --   --   --    GFR: Estimated Creatinine Clearance: 20.1 mL/min (A) (by C-G formula based on SCr of 2.91 mg/dL (H)). Liver Function Tests: Recent Labs  Lab 03/07/23 0214  ALBUMIN 2.4*   No results for input(s): "LIPASE", "AMYLASE" in the last 168 hours. Recent Labs  Lab 03/07/23 0736  AMMONIA 53*   Coagulation Profile: No results for input(s): "INR", "PROTIME" in the last 168 hours. Cardiac Enzymes: No results for input(s): "CKTOTAL", "CKMB", "CKMBINDEX", "TROPONINI" in the last 168 hours. BNP (last 3 results) No results for input(s): "PROBNP" in the last 8760 hours. HbA1C: No results for input(s): "HGBA1C" in the last 72  hours. CBG: Recent Labs  Lab 03/09/23 2001 03/09/23 2323 03/10/23 0327 03/10/23 0740 03/10/23 1144  GLUCAP 215* 153* 85 109* 131*   Lipid Profile: No results for input(s): "CHOL", "HDL", "LDLCALC", "TRIG", "CHOLHDL", "LDLDIRECT" in the last 72 hours. Thyroid Function Tests: No results for input(s): "TSH", "T4TOTAL", "FREET4", "T3FREE", "THYROIDAB" in the last 72 hours. Anemia Panel: No results for input(s): "VITAMINB12", "FOLATE", "FERRITIN", "TIBC", "IRON", "RETICCTPCT" in the last 72 hours. Urine analysis:    Component Value Date/Time   COLORURINE YELLOW 03/08/2023 1745   APPEARANCEUR CLEAR 03/08/2023 1745   LABSPEC 1.008 03/08/2023 1745   PHURINE 5.0 03/08/2023 1745   GLUCOSEU NEGATIVE 03/08/2023 1745   GLUCOSEU NEGATIVE 07/20/2014 1055   HGBUR NEGATIVE 03/08/2023 1745   HGBUR negative 08/28/2008 1446   BILIRUBINUR NEGATIVE 03/08/2023 1745   BILIRUBINUR Neg 10/28/2014 0916   KETONESUR NEGATIVE 03/08/2023 1745   PROTEINUR NEGATIVE 03/08/2023 1745   UROBILINOGEN 0.2 10/28/2014 0916   UROBILINOGEN 0.2 07/20/2014 1055   NITRITE NEGATIVE 03/08/2023 1745   LEUKOCYTESUR NEGATIVE 03/08/2023 1745   Sepsis Labs: @LABRCNTIP (procalcitonin:4,lacticidven:4)  ) No results found for this or any previous visit (from the past 240 hour(s)).    Radiology Studies: No  results found.   Scheduled Meds:  allopurinol  50 mg Oral Daily   apixaban  5 mg Oral BID   Chlorhexidine Gluconate Cloth  6 each Topical Daily   feeding supplement  237 mL Oral BID BM   influenza vaccine adjuvanted  0.5 mL Intramuscular Tomorrow-1000   midodrine  5 mg Oral TID WC   polyethylene glycol  17 g Oral Daily   predniSONE  40 mg Oral Q breakfast   propranolol ER  60 mg Oral Daily   rosuvastatin  5 mg Oral Daily   Tafamidis  61 mg Oral Daily   torsemide  40 mg Oral Daily   Continuous Infusions:     LOS: 10 days    Time spent: 35mi     Zannie Cove, MD [facility]   03/10/2023,  12:52 PM

## 2023-03-10 NOTE — Plan of Care (Signed)
  Problem: Education: Goal: Understanding of CV disease, CV risk reduction, and recovery process will improve Outcome: Progressing   Problem: Education: Goal: Individualized Educational Video(s) Outcome: Progressing   Problem: Activity: Goal: Ability to return to baseline activity level will improve Outcome: Progressing   Problem: Cardiovascular: Goal: Ability to achieve and maintain adequate cardiovascular perfusion will improve Outcome: Progressing

## 2023-03-11 DIAGNOSIS — I5023 Acute on chronic systolic (congestive) heart failure: Secondary | ICD-10-CM | POA: Diagnosis not present

## 2023-03-11 LAB — BASIC METABOLIC PANEL
Anion gap: 13 (ref 5–15)
BUN: 96 mg/dL — ABNORMAL HIGH (ref 8–23)
CO2: 33 mmol/L — ABNORMAL HIGH (ref 22–32)
Calcium: 8.9 mg/dL (ref 8.9–10.3)
Chloride: 88 mmol/L — ABNORMAL LOW (ref 98–111)
Creatinine, Ser: 2.92 mg/dL — ABNORMAL HIGH (ref 0.61–1.24)
GFR, Estimated: 22 mL/min — ABNORMAL LOW (ref >60)
Glucose, Bld: 212 mg/dL — ABNORMAL HIGH (ref 70–99)
Potassium: 3.9 mmol/L (ref 3.5–5.1)
Sodium: 134 mmol/L — ABNORMAL LOW (ref 135–145)

## 2023-03-11 LAB — GLUCOSE, CAPILLARY
Glucose-Capillary: 203 mg/dL — ABNORMAL HIGH (ref 70–99)
Glucose-Capillary: 182 mg/dL — ABNORMAL HIGH (ref 70–99)
Glucose-Capillary: 307 mg/dL — ABNORMAL HIGH (ref 70–99)
Glucose-Capillary: 416 mg/dL — ABNORMAL HIGH (ref 70–99)
Glucose-Capillary: 352 mg/dL — ABNORMAL HIGH (ref 70–99)
Glucose-Capillary: 250 mg/dL — ABNORMAL HIGH (ref 70–99)

## 2023-03-11 MED ORDER — INSULIN ASPART 100 UNIT/ML IJ SOLN
0.0000 [IU] | Freq: Three times a day (TID) | INTRAMUSCULAR | Status: DC
  Administered 2023-03-11: 15 [IU] via SUBCUTANEOUS
  Administered 2023-03-12: 2 [IU] via SUBCUTANEOUS
  Administered 2023-03-12: 11 [IU] via SUBCUTANEOUS
  Administered 2023-03-12 – 2023-03-13 (×2): 5 [IU] via SUBCUTANEOUS

## 2023-03-11 MED ORDER — INSULIN GLARGINE-YFGN 100 UNIT/ML ~~LOC~~ SOLN
15.0000 [IU] | Freq: Every day | SUBCUTANEOUS | Status: DC
  Administered 2023-03-11 – 2023-03-12 (×2): 15 [IU] via SUBCUTANEOUS
  Filled 2023-03-11 (×3): qty 0.15

## 2023-03-11 MED ORDER — INSULIN ASPART 100 UNIT/ML IJ SOLN
0.0000 [IU] | Freq: Every day | INTRAMUSCULAR | Status: DC
  Administered 2023-03-11: 3 [IU] via SUBCUTANEOUS
  Administered 2023-03-12: 5 [IU] via SUBCUTANEOUS

## 2023-03-11 MED ORDER — PANTOPRAZOLE SODIUM 40 MG PO TBEC
40.0000 mg | DELAYED_RELEASE_TABLET | Freq: Every day | ORAL | Status: DC
  Administered 2023-03-11 – 2023-03-13 (×3): 40 mg via ORAL
  Filled 2023-03-11 (×3): qty 1

## 2023-03-11 NOTE — Plan of Care (Signed)
  Problem: Education: Goal: Understanding of CV disease, CV risk reduction, and recovery process will improve Outcome: Progressing   Problem: Education: Goal: Individualized Educational Video(s) Outcome: Progressing   Problem: Activity: Goal: Ability to return to baseline activity level will improve Outcome: Progressing   Problem: Cardiovascular: Goal: Ability to achieve and maintain adequate cardiovascular perfusion will improve Outcome: Progressing

## 2023-03-11 NOTE — Progress Notes (Signed)
PROGRESS NOTE    Corey [name]  AOZ:[id number] DOB: 06/04/1945 DOA: 02/27/2023 PCP: Pincus Sanes, MD  76/M w cardiac amyloidosis, heart failure, pericardial effusion (sp pericardial window), atrial fibrillation, CVA, CKD stage IV, T2DM and prior cardiac arrest (PEA), who presented with lower extremity edema and dyspnea for about 2 weeks, associated with orthopnea, cough, and 10 lbs weight gain. On the day of hospitalization he was evaluated at the cardiology clinic, pre op evaluation for HD access, he was found volume overloaded and was referred to the ED for further evaluation.  Na 141, bun 73, cr 3,14, hgb 11,6 plt 140  CXR w/ hypoinflation, cardiomegaly, bilateral hilar vascular congestion, small left pleural effusion, pacemaker with one right atrial and one right ventricular lead.   10/17 patient placed on IV furosemide, continue volume overloaded.  10/18 improved diuresis.  10/19 tolerating well IV furosemide, but continue volume overloaded.  10/21 volume status has improved. Vascular surgery recommended follow up as outpatient for AV fistula.  10/22 nephrology consulted, Lasix increased to 120 Mg every 6 10/25: Diuretics changed to torsemide, gout flare, declined by CIR 10/26,-prednisone for gout flare    Subjective: Feels better today, right knee improving, shakes are better as well  Assessment and Plan:  Acute on chronic systolic CHF (congestive heart failure) (HCC) -Echo with EF 20 to 25%, global hypokinesis, severe LVH, RV with severe reduction in systolic function, RA with moderate dilatation,  -diuresed with IV Lasix, appreciate nephrology input, -He is 18.6 L negative, weight down 29 LB, creatinine stable at 2.9 now transition to oral torsemide to start today -Continue midodrine -GDMT limited by CKD 4 -CIR declined by insurance, appealed, TOC following, SNF as backup -Continue discharge planning, attempt to wean off O2  Acute hypoxemic respiratory due to acute cardiogenic  pulmonary edema.  -V/Q scan with no evidence of pulmonary embolism.  -Cardiac amyloidosis, continue with tafamidis.  -Follow-up with heart failure team -Wean off O2 today  Acute renal failure superimposed on stage 4 chronic kidney disease (HCC) -Baseline creatinine 2.8-3.3, creatinine peaked at 3.3, now improving -Appreciate nephrology input, 1 status improved, now on torsemide -Midodrine, avoid hypotension -Has thrombosed left arm AV fistula, Dr. Ella Jubilee discussed with vascular surgery, they recommended outpatient follow-up as no plans for urgent renal replacement therapy at this time  Anemia of chronic renal disease, with hgb at 10,9 -Stable, has mild pancytopenia as well could be secondary to amyloidosis/tafamidis  Paroxysmal atrial fibrillation (HCC) 12/28/22 CRT pacemaker implantation, BiV pacing - anticoagulation with apixaban.   Essential hypertension -Midodrine and torsemide  CAD (coronary artery disease) No chest pain, no acute coronary syndrome.   Non-insulin dependent type 2 diabetes mellitus (HCC) Hypoglycemia.  -CBGs are stable  Peripheral neuropathy -Gabapentin dose decreased few days ago, now discontinued on account of tremors and?  Myoclonus  Significant tremors -See above, on low-dose propranolol for now, symptomatically improving  Gout flare, right knee pain -Continue prednisone X 3 days, uric acid elevated  Right shoulder pain Radiograph with no fractures.  Positive degenerative changes, acromiohumeral abutment consistent with chronic rotator cuff arthropathy and likely degenerative rotator cuff tear.   Prostate cancer (HCC) Follow up as outpatient.  Urinary frequency and discomfort, suspect some component of retention add finasteride  DVT prophylaxis: apixaban Code Status: Full Code Family Communication: No family at bedside Disposition Plan: CIR versus SNF  Consultants:    Procedures:   Antimicrobials:    Objective: Vitals:   03/10/23  2200 03/10/23 2316 03/11/23 0408 03/11/23 9629  BP: 118/88 100/71 124/84 (!) 125/97  Pulse: 62  60 63  Resp: 18   16  Temp:  98.1 F (36.7 C) 98.3 F (36.8 C) 97.7 F (36.5 C)  TempSrc:  Oral Oral Oral  SpO2: 100% 93%  100%  Weight:   75 kg   Height:        Intake/Output Summary (Last 24 hours) at 03/11/2023 1040 Last data filed at 03/11/2023 0800 Gross per 24 hour  Intake --  Output 1751 ml  Net -1751 ml   Filed Weights   03/09/23 0411 03/10/23 0631 03/11/23 0408  Weight: 74.6 kg 73.5 kg 75 kg    Examination:  Gen: Pleasant elderly chronically ill male sitting up in the recliner, AAOx3 HEENT: No JVD CVS: S1-S2, regular rhythm Lungs: Decreased breath sounds at the bases Abdomen: Soft, nontender, bowel sounds present Extremities: Trace edema Skin: no new rashes on exposed skin     Data Reviewed:   CBC: Recent Labs  Lab 03/05/23 0219 03/06/23 0232  WBC 2.8* 3.1*  HGB 11.2* 10.9*  HCT 36.9* 36.1*  MCV 82.6 83.2  PLT 133* 162   Basic Metabolic Panel: Recent Labs  Lab 03/07/23 0214 03/08/23 0220 03/09/23 0219 03/10/23 0216 03/11/23 0216  NA 133* 137 138 137 134*  K 4.1 3.9 3.7 3.6 3.9  CL 93* 93* 90* 88* 88*  CO2 29 32 36* 37* 33*  GLUCOSE 104* 97 114* 121* 212*  BUN 89* 89* 92* 93* 96*  CREATININE 2.89* 2.86* 2.98* 2.91* 2.92*  CALCIUM 8.9 9.1 9.0 9.0 8.9  MG 2.7*  --  2.7*  --   --   PHOS 3.3  --   --   --   --    GFR: Estimated Creatinine Clearance: 20 mL/min (A) (by C-G formula based on SCr of 2.92 mg/dL (H)). Liver Function Tests: Recent Labs  Lab 03/07/23 0214  ALBUMIN 2.4*   No results for input(s): "LIPASE", "AMYLASE" in the last 168 hours. Recent Labs  Lab 03/07/23 0736  AMMONIA 53*   Coagulation Profile: No results for input(s): "INR", "PROTIME" in the last 168 hours. Cardiac Enzymes: No results for input(s): "CKTOTAL", "CKMB", "CKMBINDEX", "TROPONINI" in the last 168 hours. BNP (last 3 results) No results for input(s):  "PROBNP" in the last 8760 hours. HbA1C: No results for input(s): "HGBA1C" in the last 72 hours. CBG: Recent Labs  Lab 03/10/23 1542 03/10/23 2011 03/10/23 2323 03/11/23 0413 03/11/23 0817  GLUCAP 270* 327* 278* 203* 182*   Lipid Profile: No results for input(s): "CHOL", "HDL", "LDLCALC", "TRIG", "CHOLHDL", "LDLDIRECT" in the last 72 hours. Thyroid Function Tests: No results for input(s): "TSH", "T4TOTAL", "FREET4", "T3FREE", "THYROIDAB" in the last 72 hours. Anemia Panel: No results for input(s): "VITAMINB12", "FOLATE", "FERRITIN", "TIBC", "IRON", "RETICCTPCT" in the last 72 hours. Urine analysis:    Component Value Date/Time   COLORURINE YELLOW 03/08/2023 1745   APPEARANCEUR CLEAR 03/08/2023 1745   LABSPEC 1.008 03/08/2023 1745   PHURINE 5.0 03/08/2023 1745   GLUCOSEU NEGATIVE 03/08/2023 1745   GLUCOSEU NEGATIVE 07/20/2014 1055   HGBUR NEGATIVE 03/08/2023 1745   HGBUR negative 08/28/2008 1446   BILIRUBINUR NEGATIVE 03/08/2023 1745   BILIRUBINUR Neg 10/28/2014 0916   KETONESUR NEGATIVE 03/08/2023 1745   PROTEINUR NEGATIVE 03/08/2023 1745   UROBILINOGEN 0.2 10/28/2014 0916   UROBILINOGEN 0.2 07/20/2014 1055   NITRITE NEGATIVE 03/08/2023 1745   LEUKOCYTESUR NEGATIVE 03/08/2023 1745   Sepsis Labs: @LABRCNTIP (procalcitonin:4,lacticidven:4)  ) No results found for this or any previous  visit (from the past 240 hour(s)).    Radiology Studies: No results found.   Scheduled Meds:  allopurinol  50 mg Oral Daily   apixaban  5 mg Oral BID   Chlorhexidine Gluconate Cloth  6 each Topical Daily   feeding supplement  237 mL Oral BID BM   influenza vaccine adjuvanted  0.5 mL Intramuscular Tomorrow-1000   insulin glargine-yfgn  15 Units Subcutaneous Daily   midodrine  5 mg Oral TID WC   polyethylene glycol  17 g Oral Daily   predniSONE  40 mg Oral Q breakfast   propranolol ER  60 mg Oral Daily   rosuvastatin  5 mg Oral Daily   Tafamidis  61 mg Oral Daily   torsemide  40  mg Oral Daily   Continuous Infusions:     LOS: 11 days    Time spent: 35mi     Zannie Cove, MD [facility]   03/11/2023, 10:40 AM

## 2023-03-11 NOTE — Progress Notes (Signed)
Mobility Specialist Progress Note:   03/11/23 1040  Mobility  Activity Ambulated with assistance in room  Level of Assistance Moderate assist, patient does 50-74%  Assistive Device Front wheel walker  Distance Ambulated (ft) 8 ft  Activity Response Tolerated well  Mobility Referral Yes  $Mobility charge 1 Mobility  Mobility Specialist Start Time (ACUTE ONLY) 0950  Mobility Specialist Stop Time (ACUTE ONLY) 1005  Mobility Specialist Time Calculation (min) (ACUTE ONLY) 15 min    Pt received in chair, agreeable to mobility. Pt needing ModA to stand. Stated that he feels better today compared to yesterday. Pt able to ambulate to bathroom door from chair with CG for safety. Slight knee buckle when returning to chair, otherwise asymptomatic throughout. Pt left in chair with call bell in reach and all needs met. Chair alarm on. RN notified.   Leory Plowman  Mobility Specialist Please contact via [employer] office at [phone]

## 2023-03-12 DIAGNOSIS — I5023 Acute on chronic systolic (congestive) heart failure: Secondary | ICD-10-CM | POA: Diagnosis not present

## 2023-03-12 LAB — GLUCOSE, CAPILLARY
Glucose-Capillary: 139 mg/dL — ABNORMAL HIGH (ref 70–99)
Glucose-Capillary: 143 mg/dL — ABNORMAL HIGH (ref 70–99)
Glucose-Capillary: 226 mg/dL — ABNORMAL HIGH (ref 70–99)
Glucose-Capillary: 305 mg/dL — ABNORMAL HIGH (ref 70–99)
Glucose-Capillary: 369 mg/dL — ABNORMAL HIGH (ref 70–99)

## 2023-03-12 LAB — CBC
HCT: 34.6 % — ABNORMAL LOW (ref 39.0–52.0)
Hemoglobin: 11 g/dL — ABNORMAL LOW (ref 13.0–17.0)
MCH: 25.8 pg — ABNORMAL LOW (ref 26.0–34.0)
MCHC: 31.8 g/dL (ref 30.0–36.0)
MCV: 81.2 fL (ref 80.0–100.0)
Platelets: 205 10*3/uL (ref 150–400)
RBC: 4.26 MIL/uL (ref 4.22–5.81)
RDW: 18.8 % — ABNORMAL HIGH (ref 11.5–15.5)
WBC: 3.8 10*3/uL — ABNORMAL LOW (ref 4.0–10.5)
nRBC: 0 % (ref 0.0–0.2)

## 2023-03-12 LAB — BASIC METABOLIC PANEL
Anion gap: 12 (ref 5–15)
BUN: 110 mg/dL — ABNORMAL HIGH (ref 8–23)
CO2: 33 mmol/L — ABNORMAL HIGH (ref 22–32)
Calcium: 9.1 mg/dL (ref 8.9–10.3)
Chloride: 86 mmol/L — ABNORMAL LOW (ref 98–111)
Creatinine, Ser: 3.11 mg/dL — ABNORMAL HIGH (ref 0.61–1.24)
GFR, Estimated: 20 mL/min — ABNORMAL LOW (ref >60)
Glucose, Bld: 184 mg/dL — ABNORMAL HIGH (ref 70–99)
Potassium: 4 mmol/L (ref 3.5–5.1)
Sodium: 131 mmol/L — ABNORMAL LOW (ref 135–145)

## 2023-03-12 MED ORDER — TORSEMIDE 20 MG PO TABS: 40.0000 mg | ORAL_TABLET | Freq: Every day | ORAL | Status: DC

## 2023-03-12 MED ORDER — INSULIN ASPART 100 UNIT/ML IJ SOLN
3.0000 [IU] | Freq: Three times a day (TID) | INTRAMUSCULAR | Status: DC
  Administered 2023-03-12 – 2023-03-13 (×5): 3 [IU] via SUBCUTANEOUS

## 2023-03-12 MED ORDER — PREDNISONE 20 MG PO TABS
20.0000 mg | ORAL_TABLET | Freq: Every day | ORAL | Status: DC
  Administered 2023-03-13: 20 mg via ORAL
  Filled 2023-03-12: qty 1

## 2023-03-12 MED ORDER — MIDODRINE HCL 5 MG PO TABS
5.0000 mg | ORAL_TABLET | Freq: Two times a day (BID) | ORAL | Status: DC
  Administered 2023-03-12 – 2023-03-13 (×2): 5 mg via ORAL
  Filled 2023-03-12 (×2): qty 1

## 2023-03-12 NOTE — Progress Notes (Signed)
Physical Therapy Treatment Patient Details Name: Corey [name] MRN: [id number] DOB: 1946-01-24 Today's Date: 03/12/2023   History of Present Illness Pt is a 77 y.o. male presenting 10/15 from cardiologist office with distended abdomen, BLE swelling and weeping. CXR showing mild pulmonary interstitial edema and L>R pleural effusions with adjacent airspace opacity which may reflect atelectasis or infection. Pt also with R shoulder pain post recent fall at home with radiograph with no fractures but positive for degenerative changes and consistent with chronic rotator cuff arthropathy and likely degenerative rotator cuff tear. PMH significant for cardiac amyloidosis (ATTR), HFrEF, PEA arrest, pericardial effusion, hypertension, hyperlipidemia, CKD stage IV, DMII, CVA, A-fib/flutter s/p CTI ablation March 2024 on Eliquis, s/p CRT placement August 2024, gout, prostate cancer, seizures.    PT Comments  Patient progressing to hallway ambulation this session and on room air with SpO2 90 or greater.  Able to tolerate without episodes of buckling and able to enter bed with less assistance.  Still with limited activity tolerance and high fall risk.  Continue to recommend inpatient rehab prior to d/c home.  PT will continue to follow.     If plan is discharge home, recommend the following: A little help with walking and/or transfers;Assistance with cooking/housework;Assist for transportation;Help with stairs or ramp for entrance;A little help with bathing/dressing/bathroom   Can travel by private vehicle        Equipment Recommendations  Other (comment) (TBA)    Recommendations for Other Services       Precautions / Restrictions Precautions Precautions: Fall     Mobility  Bed Mobility Overal bed mobility: Needs Assistance Bed Mobility: Supine to Sit Rolling: Min assist, Used rails   Supine to sit: Contact guard     General bed mobility comments: one leg off EOB assist for trunk as pt coming  upright; to supine assist for positioning, pt able to lift legs onto bed    Transfers Overall transfer level: Needs assistance Equipment used: Rolling walker (2 wheels) Transfers: Sit to/from Stand Sit to Stand: Contact guard assist   Step pivot transfers: Contact guard assist       General transfer comment: up to stand from EOB with A for balance; also step pivot to [facility] for toileting after ambulation    Ambulation/Gait Ambulation/Gait assistance: Contact guard assist Gait Distance (Feet): 90 Feet Assistive device: Rolling walker (2 wheels) Gait Pattern/deviations: Step-through pattern, Decreased stride length, Trunk flexed       General Gait Details: assist for balance and cues for direction esp with turns, patient without buckling episodes, but son following with chair for safety   Stairs             Wheelchair Mobility     Tilt Bed    Modified Rankin (Stroke Patients Only)       Balance Overall balance assessment: Needs assistance   Sitting balance-Leahy Scale: Fair     Standing balance support: Bilateral upper extremity supported Standing balance-Leahy Scale: Poor Standing balance comment: UE support for balance                            Cognition Arousal: Alert Behavior During Therapy: Flat affect Overall Cognitive Status: Impaired/Different from baseline Area of Impairment: Attention, Problem solving                   Current Attention Level: Sustained         Problem Solving: Slow processing, Difficulty sequencing,  Requires verbal cues          Exercises      General Comments General comments (skin integrity, edema, etc.): on RA throughout session with SpO2 90 or greater during ambulation; toileted on BSC after ambulation with assist for hygiene and RN in to check skin due to pt c/o rash on his bottom      Pertinent Vitals/Pain Pain Assessment Faces Pain Scale: No hurt    Home Living                           Prior Function            PT Goals (current goals can now be found in the care plan section) Progress towards PT goals: Progressing toward goals    Frequency    Min 1X/week      PT Plan      Co-evaluation              AM-PAC PT "6 Clicks" Mobility   Outcome Measure  Help needed turning from your back to your side while in a flat bed without using bedrails?: A Little Help needed moving from lying on your back to sitting on the side of a flat bed without using bedrails?: A Little Help needed moving to and from a bed to a chair (including a wheelchair)?: A Little Help needed standing up from a chair using your arms (e.g., wheelchair or bedside chair)?: A Little Help needed to walk in hospital room?: A Little Help needed climbing 3-5 steps with a railing? : Total 6 Click Score: 16    End of Session Equipment Utilized During Treatment: Gait belt Activity Tolerance: Patient tolerated treatment well Patient left: in bed;with family/visitor present;with call bell/phone within reach   PT Visit Diagnosis: Other abnormalities of gait and mobility (R26.89);Muscle weakness (generalized) (M62.81);History of falling (Z91.81)     Time: 1520-1550 PT Time Calculation (min) (ACUTE ONLY): 30 min  Charges:    $Gait Training: 8-22 mins $Therapeutic Activity: 8-22 mins PT General Charges $$ ACUTE PT VISIT: 1 Visit                     [name], PT Acute Rehabilitation Services Office:[phone] 03/12/2023    Corey [name] 03/12/2023, 7:15 PM

## 2023-03-12 NOTE — Progress Notes (Signed)
PROGRESS NOTE    Corey [name]  BJY:[id number] DOB: August 30, 1945 DOA: 02/27/2023 PCP: Pincus Sanes, MD  76/M w cardiac amyloidosis, heart failure, pericardial effusion (sp pericardial window), atrial fibrillation, CVA, CKD stage IV, T2DM and prior cardiac arrest (PEA), who presented with lower extremity edema and dyspnea for about 2 weeks, associated with orthopnea, cough, and 10 lbs weight gain. On the day of hospitalization he was evaluated at the cardiology clinic, pre op evaluation for HD access, he was found volume overloaded and was referred to the ED for further evaluation.  Na 141, bun 73, cr 3,14, hgb 11,6 plt 140  CXR w/ hypoinflation, cardiomegaly, bilateral hilar vascular congestion, small left pleural effusion, pacemaker with one right atrial and one right ventricular lead.   10/17 patient placed on IV furosemide, continue volume overloaded.  10/18 improved diuresis.  10/19 tolerating well IV furosemide, but continue volume overloaded.  10/21 volume status has improved. Vascular surgery recommended follow up as outpatient for AV fistula.  10/22 nephrology consulted, Lasix increased to 120 Mg every 6 10/25: Diuretics changed to torsemide, gout flare, declined by CIR 10/26,-prednisone for gout flare, low-dose propranolol for tremors    Subjective: Feels better overall, right knee is improving, tremors much improved  Assessment and Plan:  Acute on chronic systolic CHF (congestive heart failure) (HCC) -Echo with EF 20 to 25%, global hypokinesis, severe LVH, RV with severe reduction in systolic function, RA with moderate dilatation,  -diuresed with IV Lasix, appreciate nephrology input, -He is 20.2 L negative, weight down 29 LB, creatinine plateaued in the 2.9 range, transitioned to torsemide, creatinine and BUN trending up will hold torsemide dose today -Continue midodrine -GDMT limited by CKD 4 -CIR declined by insurance, appealed, TOC following, SNF as backup -Continue  discharge planning, attempt to wean off O2  Acute hypoxemic respiratory due to acute cardiogenic pulmonary edema.  -V/Q scan with no evidence of pulmonary embolism.  -Cardiac amyloidosis, continue with tafamidis.  -Follow-up with heart failure team -Wean off O2 today  Acute renal failure superimposed on stage 4 chronic kidney disease (HCC) -Baseline creatinine 2.8-3.3, creatinine peaked at 3.3, now improving -Appreciate nephrology input, 1 status improved, now on torsemide -Midodrine, avoid hypotension -Has thrombosed left arm AV fistula, Dr. Ella Jubilee discussed with vascular surgery, they recommended outpatient follow-up as no plans for urgent renal replacement therapy at this time  Anemia of chronic renal disease, with hgb at 10,9 -Stable, has mild pancytopenia as well could be secondary to amyloidosis/tafamidis  Paroxysmal atrial fibrillation (HCC) 12/28/22 CRT pacemaker implantation, BiV pacing - anticoagulation with apixaban.   Essential hypertension -Midodrine and torsemide  CAD (coronary artery disease) No chest pain, no acute coronary syndrome.   Non-insulin dependent type 2 diabetes mellitus (HCC) Hypoglycemia.  -CBGs are stable  Peripheral neuropathy -Gabapentin dose decreased few days ago, now discontinued on account of tremors and?  Myoclonus  Significant tremors -See above, on low-dose propranolol for now, symptomatically improving  Gout flare, right knee pain -Continue prednisone X 3 days, uric acid elevated  Right shoulder pain Radiograph with no fractures.  Positive degenerative changes, acromiohumeral abutment consistent with chronic rotator cuff arthropathy and likely degenerative rotator cuff tear.   Recurrent urinary retention Prostate cancer (HCC) Follow up as outpatient.  -Now with Foley, attempt voiding trial when more ambulatory  DVT prophylaxis: apixaban Code Status: Full Code Family Communication: No family at bedside Disposition Plan: CIR  versus SNF  Consultants:    Procedures:   Antimicrobials:    Objective:  Vitals:   03/11/23 2303 03/12/23 0258 03/12/23 0300 03/12/23 0701  BP: 122/85 (!) 119/90  (!) 121/94  Pulse: 62 61    Resp: 20 19  18   Temp: 98.2 F (36.8 C) 97.8 F (36.6 C)  97.9 F (36.6 C)  TempSrc: Oral Oral  Oral  SpO2: 100% 99%  100%  Weight:   76.7 kg   Height:        Intake/Output Summary (Last 24 hours) at 03/12/2023 0930 Last data filed at 03/12/2023 0302 Gross per 24 hour  Intake 480 ml  Output 601 ml  Net -121 ml   Filed Weights   03/10/23 0631 03/11/23 0408 03/12/23 0300  Weight: 73.5 kg 75 kg 76.7 kg    Examination:  Gen: Pleasant elderly male sitting up in the recliner, AAOx3 HEENT: No JVD CVS: S1-S2, regular rhythm Lungs: Decreased breath sounds at the bases Abdomen: Soft, nontender, bowel sounds present Extremities: Trace edema  Skin: no new rashes on exposed skin     Data Reviewed:   CBC: Recent Labs  Lab 03/06/23 0232 03/12/23 0223  WBC 3.1* 3.8*  HGB 10.9* 11.0*  HCT 36.1* 34.6*  MCV 83.2 81.2  PLT 162 205   Basic Metabolic Panel: Recent Labs  Lab 03/07/23 0214 03/08/23 0220 03/09/23 0219 03/10/23 0216 03/11/23 0216 03/12/23 0223  NA 133* 137 138 137 134* 131*  K 4.1 3.9 3.7 3.6 3.9 4.0  CL 93* 93* 90* 88* 88* 86*  CO2 29 32 36* 37* 33* 33*  GLUCOSE 104* 97 114* 121* 212* 184*  BUN 89* 89* 92* 93* 96* 110*  CREATININE 2.89* 2.86* 2.98* 2.91* 2.92* 3.11*  CALCIUM 8.9 9.1 9.0 9.0 8.9 9.1  MG 2.7*  --  2.7*  --   --   --   PHOS 3.3  --   --   --   --   --    GFR: Estimated Creatinine Clearance: 20.3 mL/min (A) (by C-G formula based on SCr of 3.11 mg/dL (H)). Liver Function Tests: Recent Labs  Lab 03/07/23 0214  ALBUMIN 2.4*   No results for input(s): "LIPASE", "AMYLASE" in the last 168 hours. Recent Labs  Lab 03/07/23 0736  AMMONIA 53*   Coagulation Profile: No results for input(s): "INR", "PROTIME" in the last 168 hours. Cardiac  Enzymes: No results for input(s): "CKTOTAL", "CKMB", "CKMBINDEX", "TROPONINI" in the last 168 hours. BNP (last 3 results) No results for input(s): "PROBNP" in the last 8760 hours. HbA1C: No results for input(s): "HGBA1C" in the last 72 hours. CBG: Recent Labs  Lab 03/11/23 1241 03/11/23 1635 03/11/23 1950 03/11/23 2134 03/12/23 0621  GLUCAP 307* 416* 352* 250* 139*   Lipid Profile: No results for input(s): "CHOL", "HDL", "LDLCALC", "TRIG", "CHOLHDL", "LDLDIRECT" in the last 72 hours. Thyroid Function Tests: No results for input(s): "TSH", "T4TOTAL", "FREET4", "T3FREE", "THYROIDAB" in the last 72 hours. Anemia Panel: No results for input(s): "VITAMINB12", "FOLATE", "FERRITIN", "TIBC", "IRON", "RETICCTPCT" in the last 72 hours. Urine analysis:    Component Value Date/Time   COLORURINE YELLOW 03/08/2023 1745   APPEARANCEUR CLEAR 03/08/2023 1745   LABSPEC 1.008 03/08/2023 1745   PHURINE 5.0 03/08/2023 1745   GLUCOSEU NEGATIVE 03/08/2023 1745   GLUCOSEU NEGATIVE 07/20/2014 1055   HGBUR NEGATIVE 03/08/2023 1745   HGBUR negative 08/28/2008 1446   BILIRUBINUR NEGATIVE 03/08/2023 1745   BILIRUBINUR Neg 10/28/2014 0916   KETONESUR NEGATIVE 03/08/2023 1745   PROTEINUR NEGATIVE 03/08/2023 1745   UROBILINOGEN 0.2 10/28/2014 0916   UROBILINOGEN 0.2  07/20/2014 1055   NITRITE NEGATIVE 03/08/2023 1745   LEUKOCYTESUR NEGATIVE 03/08/2023 1745   Sepsis Labs: @LABRCNTIP (procalcitonin:4,lacticidven:4)  ) No results found for this or any previous visit (from the past 240 hour(s)).    Radiology Studies: No results found.   Scheduled Meds:  allopurinol  50 mg Oral Daily   apixaban  5 mg Oral BID   Chlorhexidine Gluconate Cloth  6 each Topical Daily   feeding supplement  237 mL Oral BID BM   influenza vaccine adjuvanted  0.5 mL Intramuscular Tomorrow-1000   insulin aspart  0-15 Units Subcutaneous TID WC   insulin aspart  0-5 Units Subcutaneous QHS   insulin aspart  3 Units  Subcutaneous TID WC   insulin glargine-yfgn  15 Units Subcutaneous Daily   midodrine  5 mg Oral BID WC   pantoprazole  40 mg Oral Q1200   polyethylene glycol  17 g Oral Daily   [START ON 03/13/2023] predniSONE  20 mg Oral Q breakfast   propranolol ER  60 mg Oral Daily   rosuvastatin  5 mg Oral Daily   Tafamidis  61 mg Oral Daily   [START ON 03/13/2023] torsemide  40 mg Oral Daily   Continuous Infusions:     LOS: 12 days    Time spent:    Zannie Cove, MD [facility]   03/12/2023, 9:30 AM

## 2023-03-12 NOTE — Plan of Care (Signed)
  Problem: Cardiovascular: Goal: Vascular access site(s) Level 0-1 will be maintained Outcome: Progressing   Problem: Education: Goal: Understanding of disease, treatment, and recovery process will improve Outcome: Progressing   Problem: Clinical Measurements: Goal: Will remain free from infection Outcome: Progressing

## 2023-03-12 NOTE — TOC Progression Note (Addendum)
Transition of Care [facility]) - Progression Note    Patient Details  Name: Corey [name] MRN: [id number] Date of Birth: 06/09/1945  Transition [facility] Care [facility]) CM/SW Contact  Carley Hammed, LCSW Phone Number: 03/12/2023, 1:24 PM  Clinical Narrative:    CSW spoke with pt's spouse and advised that insurance has denied the appeal, and CIR would not be an option. Spouse upset, but agreeable to to pursuing SNF. Bed offers were provided and spouse states she will call CSW back with their choice. CSW to start auth with bed choice. TOC will continue to follow.   2:00 Pt's spouse chose [facility], [place] approved, bed will be available tomorrow.   3:00 Spouse called back and switched choice to [facility]. Facilities notified and insurance updated. TOC will continue to follow.   Expected Discharge Plan: Home w Home Health Services Barriers to Discharge: Continued Medical Work up  Expected Discharge Plan and Services       Living arrangements for the past 2 months: Single Family Home                           HH Arranged: PT, OT HH Agency: [facility] Date [facility] Agency Contacted: 03/03/23 Time HH Agency Contacted: 0840 Representative spoke with at [facility] Agency: Kandee Keen   Social Determinants of Health (SDOH) Interventions SDOH Screenings   Food Insecurity: No Food Insecurity (03/01/2023)  Housing: Low Risk  (03/01/2023)  Transportation Needs: No Transportation Needs (03/01/2023)  Utilities: Not At Risk (03/01/2023)  Alcohol Screen: Low Risk  (02/08/2023)  Depression (PHQ2-9): Low Risk  (02/08/2023)  Financial Resource Strain: Low Risk  (02/08/2023)  Physical Activity: Insufficiently Active (02/08/2023)  Social Connections: Socially Integrated (02/08/2023)  Stress: No Stress Concern Present (02/08/2023)  Tobacco Use: Medium Risk (02/27/2023)  Health Literacy: Adequate Health Literacy (02/08/2023)    Readmission Risk Interventions    09/30/2020    2:29 PM  Readmission Risk  Prevention Plan  Transportation Screening Complete  PCP or Specialist Appt within 5-7 Days Complete  Home Care Screening Complete  Medication Review (RN CM) Complete

## 2023-03-12 NOTE — Progress Notes (Signed)
Occupational Therapy Treatment Patient Details Name: Corey [name] MRN: [id number] DOB: 02/19/1946 Today's Date: 03/12/2023   History of present illness Pt is a 77 y.o. male presenting 10/15 from cardiologist office with distended abdomen, BLE swelling and weeping. CXR showing mild pulmonary interstitial edema and L>R pleural effusions with adjacent airspace opacity which may reflect atelectasis or infection. Pt also with R shoulder pain post recent fall at home with radiograph with no fractures but positive for degenerative changes and consistent with chronic rotator cuff arthropathy and likely degenerative rotator cuff tear. PMH significant for cardiac amyloidosis (ATTR), HFrEF, PEA arrest, pericardial effusion, hypertension, hyperlipidemia, CKD stage IV, DMII, CVA, A-fib/flutter s/p CTI ablation March 2024 on Eliquis, s/p CRT placement August 2024, gout, prostate cancer, seizures.   OT comments  Pt with good progress toward established OT goals this session. Performing UB there-ex as warm up. Able to stand from chair with min A and cues for safety with no observed knee buckling this session; brushing teeth with set-up at tray table in standing with min A for balance, and then functional mobility with RW; standing for 8+ minutes this session with intermittent standing rest breaks. Continue to highly recommend AIR to optimize safety and independence in ADL and IADL.       If plan is discharge home, recommend the following:  A little help with walking and/or transfers;A lot of help with bathing/dressing/bathroom;Assistance with cooking/housework;Assist for transportation;Help with stairs or ramp for entrance;A lot of help with walking and/or transfers;Assistance with feeding   Equipment Recommendations  BSC/3in1;Tub/shower seat    Recommendations for Other Services      Precautions / Restrictions Precautions Precautions: Fall Precaution Comments: knees buckling Restrictions Weight Bearing  Restrictions: No       Mobility Bed Mobility               General bed mobility comments: OOB in chair    Transfers Overall transfer level: Needs assistance Equipment used: Rolling walker (2 wheels), None Transfers: Sit to/from Stand Sit to Stand: Min assist           General transfer comment: cues for hand placement     Balance Overall balance assessment: Needs assistance Sitting-balance support: Feet supported, No upper extremity supported Sitting balance-Leahy Scale: Fair     Standing balance support: Bilateral upper extremity supported, Reliant on assistive device for balance Standing balance-Leahy Scale: Poor Standing balance comment: reliant on RW and frequent  intermittent external asisst                           ADL either performed or assessed with clinical judgement   ADL Overall ADL's : Needs assistance/impaired     Grooming: Wash/dry face;Oral care;Minimal assistance;Standing Grooming Details (indicate cue type and reason): Min A for standing balance intermittently                 Toilet Transfer: Minimal assistance;Ambulation;Rolling walker (2 wheels);Cueing for safety;Cueing for sequencing Toilet Transfer Details (indicate cue type and reason): cues for hand placement, posture, and safe use of RW         Functional mobility during ADLs: Minimal assistance;Contact guard assist;Rolling walker (2 wheels) General ADL Comments: Pt able to tolerate standing ~2 minutes for oral care then an additional ~5-7 minutes for mobility. with intermittent ~30 second standing rest breaks. HR monitor reading from ~90s to 140, but RN reports it has been doing this at rest and poor reading; attempted to replace  leads, but this did not improve. Per pulse ox HR 60s at rest and 90s OOB    Extremity/Trunk Assessment              Vision       Perception     Praxis      Cognition Arousal: Alert Behavior During Therapy: Flat  affect Overall Cognitive Status: Impaired/Different from baseline Area of Impairment: Problem solving, Attention                   Current Attention Level: Sustained         Problem Solving: Difficulty sequencing, Requires verbal cues, Requires tactile cues General Comments: flat affect        Exercises Exercises: Other exercises Other Exercises Other Exercises: scapular squeezes x 10 Other Exercises: therapy ball squeezes x15 Ea UE    Shoulder Instructions       General Comments      Pertinent Vitals/ Pain       Pain Assessment Pain Assessment: Faces Faces Pain Scale: No hurt Pain Intervention(s): Monitored during session  Home Living                                          Prior Functioning/Environment              Frequency  Min 1X/week        Progress Toward Goals  OT Goals(current goals can now be found in the care plan section)  Progress towards OT goals: Progressing toward goals  Acute Rehab OT Goals Patient Stated Goal: get better OT Goal Formulation: With patient Time For Goal Achievement: 03/15/23 Potential to Achieve Goals: Good ADL Goals Pt Will Perform Upper Body Bathing: with supervision;sitting Pt Will Perform Lower Body Bathing: with contact guard assist;sit to/from stand;sitting/lateral leans Pt Will Perform Upper Body Dressing: with supervision;sitting Pt Will Perform Lower Body Dressing: with supervision;sit to/from stand;sitting/lateral leans Pt Will Transfer to Toilet: with supervision;ambulating;bedside commode Pt Will Perform Toileting - Clothing Manipulation and hygiene: with contact guard assist;sitting/lateral leans;sit to/from stand Pt/caregiver will Perform Home Exercise Program: Both right and left upper extremity;With theraband;Increased strength;With Supervision;With written HEP provided  Plan      Co-evaluation                 AM-PAC OT "6 Clicks" Daily Activity     Outcome  Measure   Help from another person eating meals?: None Help from another person taking care of personal grooming?: A Little Help from another person toileting, which includes using toliet, bedpan, or urinal?: A Lot Help from another person bathing (including washing, rinsing, drying)?: A Lot Help from another person to put on and taking off regular upper body clothing?: A Little Help from another person to put on and taking off regular lower body clothing?: A Lot 6 Click Score: 16    End of Session Equipment Utilized During Treatment: Gait belt;Rolling walker (2 wheels)  OT Visit Diagnosis: Other abnormalities of gait and mobility (R26.89);History of falling (Z91.81);Repeated falls (R29.6);Muscle weakness (generalized) (M62.81);Pain;Feeding difficulties (R63.3)   Activity Tolerance Patient tolerated treatment well   Patient Left in chair;with call bell/phone within reach;with chair alarm set   Nurse Communication Mobility status        Time: 7829-5621 OT Time Calculation (min): 26 min  Charges: OT General Charges $OT Visit: 1 Visit OT Treatments $Self Care/Home Management :  23-37 mins  Myrla Halsted, OTD, OTR/L [facility] Office: [phone]   Myrla Halsted 03/12/2023, 9:06 AM

## 2023-03-12 NOTE — Progress Notes (Signed)
Mobility Specialist Progress Note:   03/12/23 1000  Oxygen Therapy  O2 Device Nasal Cannula  O2 Flow Rate (L/min) 3 L/min  Mobility  Activity Stood at bedside;Ambulated with assistance in room  Level of Assistance Moderate assist, patient does 50-74%  Assistive Device Front wheel walker  Distance Ambulated (ft) 40 ft  Activity Response Tolerated well  Mobility Referral Yes  $Mobility charge 1 Mobility  Mobility Specialist Start Time (ACUTE ONLY) 1018  Mobility Specialist Stop Time (ACUTE ONLY) 1044  Mobility Specialist Time Calculation (min) (ACUTE ONLY) 26 min    Pt received in chair, agreeable to mobility. Perfomed x1 STS and marched in place. Asymptomatic and no complaints throughout. Pt requested to walk around bed and back to chair. Required max verbal  cues for direction. No complaints throughout. Pt left in chair with call bell and all needs met. Chair alarm on.  D'Vante Earlene Plater Mobility Specialist Please contact via Special educational needs teacher or Rehab office at [phone]

## 2023-03-12 NOTE — Progress Notes (Signed)
Inpatient Rehab Admissions Coordinator:    I received notification from [facility] and they have denied Pt.'s appeal for CIR. He will need SNF vs HH. TOC notified.   Megan Salon, MS, CCC-SLP Rehab Admissions Coordinator  [phone] (celll) [phone] (office)

## 2023-03-13 ENCOUNTER — Other Ambulatory Visit (HOSPITAL_COMMUNITY): Payer: Self-pay

## 2023-03-13 ENCOUNTER — Encounter (HOSPITAL_COMMUNITY): Payer: Self-pay

## 2023-03-13 ENCOUNTER — Other Ambulatory Visit: Payer: Medicare HMO

## 2023-03-13 DIAGNOSIS — Z66 Do not resuscitate: Secondary | ICD-10-CM | POA: Diagnosis not present

## 2023-03-13 DIAGNOSIS — I43 Cardiomyopathy in diseases classified elsewhere: Secondary | ICD-10-CM | POA: Diagnosis present

## 2023-03-13 DIAGNOSIS — E43 Unspecified severe protein-calorie malnutrition: Secondary | ICD-10-CM | POA: Diagnosis not present

## 2023-03-13 DIAGNOSIS — Z7401 Bed confinement status: Secondary | ICD-10-CM | POA: Diagnosis not present

## 2023-03-13 DIAGNOSIS — I5023 Acute on chronic systolic (congestive) heart failure: Secondary | ICD-10-CM | POA: Diagnosis not present

## 2023-03-13 DIAGNOSIS — I5020 Unspecified systolic (congestive) heart failure: Secondary | ICD-10-CM | POA: Diagnosis not present

## 2023-03-13 DIAGNOSIS — I5043 Acute on chronic combined systolic (congestive) and diastolic (congestive) heart failure: Secondary | ICD-10-CM | POA: Diagnosis not present

## 2023-03-13 DIAGNOSIS — J9811 Atelectasis: Secondary | ICD-10-CM | POA: Diagnosis not present

## 2023-03-13 DIAGNOSIS — J9601 Acute respiratory failure with hypoxia: Secondary | ICD-10-CM | POA: Diagnosis not present

## 2023-03-13 DIAGNOSIS — E559 Vitamin D deficiency, unspecified: Secondary | ICD-10-CM | POA: Diagnosis not present

## 2023-03-13 DIAGNOSIS — R410 Disorientation, unspecified: Secondary | ICD-10-CM | POA: Diagnosis not present

## 2023-03-13 DIAGNOSIS — D631 Anemia in chronic kidney disease: Secondary | ICD-10-CM | POA: Diagnosis present

## 2023-03-13 DIAGNOSIS — T8383XA Hemorrhage of genitourinary prosthetic devices, implants and grafts, initial encounter: Secondary | ICD-10-CM | POA: Diagnosis not present

## 2023-03-13 DIAGNOSIS — N185 Chronic kidney disease, stage 5: Secondary | ICD-10-CM | POA: Diagnosis present

## 2023-03-13 DIAGNOSIS — J441 Chronic obstructive pulmonary disease with (acute) exacerbation: Secondary | ICD-10-CM | POA: Diagnosis not present

## 2023-03-13 DIAGNOSIS — E872 Acidosis, unspecified: Secondary | ICD-10-CM | POA: Diagnosis not present

## 2023-03-13 DIAGNOSIS — L89322 Pressure ulcer of left buttock, stage 2: Secondary | ICD-10-CM | POA: Diagnosis present

## 2023-03-13 DIAGNOSIS — R404 Transient alteration of awareness: Secondary | ICD-10-CM | POA: Diagnosis not present

## 2023-03-13 DIAGNOSIS — E119 Type 2 diabetes mellitus without complications: Secondary | ICD-10-CM | POA: Diagnosis not present

## 2023-03-13 DIAGNOSIS — D61818 Other pancytopenia: Secondary | ICD-10-CM | POA: Diagnosis present

## 2023-03-13 DIAGNOSIS — M6281 Muscle weakness (generalized): Secondary | ICD-10-CM | POA: Diagnosis not present

## 2023-03-13 DIAGNOSIS — R0989 Other specified symptoms and signs involving the circulatory and respiratory systems: Secondary | ICD-10-CM | POA: Diagnosis not present

## 2023-03-13 DIAGNOSIS — M10361 Gout due to renal impairment, right knee: Secondary | ICD-10-CM | POA: Diagnosis not present

## 2023-03-13 DIAGNOSIS — R918 Other nonspecific abnormal finding of lung field: Secondary | ICD-10-CM | POA: Diagnosis not present

## 2023-03-13 DIAGNOSIS — N289 Disorder of kidney and ureter, unspecified: Secondary | ICD-10-CM | POA: Diagnosis not present

## 2023-03-13 DIAGNOSIS — E162 Hypoglycemia, unspecified: Secondary | ICD-10-CM | POA: Diagnosis not present

## 2023-03-13 DIAGNOSIS — E854 Organ-limited amyloidosis: Secondary | ICD-10-CM | POA: Diagnosis present

## 2023-03-13 DIAGNOSIS — R55 Syncope and collapse: Secondary | ICD-10-CM | POA: Diagnosis not present

## 2023-03-13 DIAGNOSIS — I509 Heart failure, unspecified: Secondary | ICD-10-CM | POA: Diagnosis not present

## 2023-03-13 DIAGNOSIS — R531 Weakness: Secondary | ICD-10-CM | POA: Diagnosis not present

## 2023-03-13 DIAGNOSIS — I959 Hypotension, unspecified: Secondary | ICD-10-CM | POA: Diagnosis not present

## 2023-03-13 DIAGNOSIS — I482 Chronic atrial fibrillation, unspecified: Secondary | ICD-10-CM | POA: Diagnosis not present

## 2023-03-13 DIAGNOSIS — I272 Pulmonary hypertension, unspecified: Secondary | ICD-10-CM | POA: Diagnosis present

## 2023-03-13 DIAGNOSIS — Y732 Prosthetic and other implants, materials and accessory gastroenterology and urology devices associated with adverse incidents: Secondary | ICD-10-CM | POA: Diagnosis not present

## 2023-03-13 DIAGNOSIS — I4892 Unspecified atrial flutter: Secondary | ICD-10-CM | POA: Diagnosis present

## 2023-03-13 DIAGNOSIS — K5900 Constipation, unspecified: Secondary | ICD-10-CM | POA: Diagnosis not present

## 2023-03-13 DIAGNOSIS — I132 Hypertensive heart and chronic kidney disease with heart failure and with stage 5 chronic kidney disease, or end stage renal disease: Secondary | ICD-10-CM | POA: Diagnosis not present

## 2023-03-13 DIAGNOSIS — J9622 Acute and chronic respiratory failure with hypercapnia: Secondary | ICD-10-CM | POA: Diagnosis not present

## 2023-03-13 DIAGNOSIS — Z978 Presence of other specified devices: Secondary | ICD-10-CM | POA: Diagnosis not present

## 2023-03-13 DIAGNOSIS — I5021 Acute systolic (congestive) heart failure: Secondary | ICD-10-CM | POA: Diagnosis not present

## 2023-03-13 DIAGNOSIS — E875 Hyperkalemia: Secondary | ICD-10-CM | POA: Diagnosis not present

## 2023-03-13 DIAGNOSIS — I6782 Cerebral ischemia: Secondary | ICD-10-CM | POA: Diagnosis not present

## 2023-03-13 DIAGNOSIS — G8929 Other chronic pain: Secondary | ICD-10-CM | POA: Diagnosis not present

## 2023-03-13 DIAGNOSIS — E161 Other hypoglycemia: Secondary | ICD-10-CM | POA: Diagnosis not present

## 2023-03-13 DIAGNOSIS — E11649 Type 2 diabetes mellitus with hypoglycemia without coma: Secondary | ICD-10-CM | POA: Diagnosis not present

## 2023-03-13 DIAGNOSIS — Z515 Encounter for palliative care: Secondary | ICD-10-CM | POA: Diagnosis not present

## 2023-03-13 DIAGNOSIS — J9621 Acute and chronic respiratory failure with hypoxia: Secondary | ICD-10-CM | POA: Diagnosis not present

## 2023-03-13 DIAGNOSIS — L89312 Pressure ulcer of right buttock, stage 2: Secondary | ICD-10-CM | POA: Diagnosis not present

## 2023-03-13 DIAGNOSIS — I5042 Chronic combined systolic (congestive) and diastolic (congestive) heart failure: Secondary | ICD-10-CM | POA: Diagnosis not present

## 2023-03-13 DIAGNOSIS — R0902 Hypoxemia: Secondary | ICD-10-CM | POA: Diagnosis not present

## 2023-03-13 DIAGNOSIS — Z7189 Other specified counseling: Secondary | ICD-10-CM | POA: Diagnosis not present

## 2023-03-13 DIAGNOSIS — G40909 Epilepsy, unspecified, not intractable, without status epilepticus: Secondary | ICD-10-CM | POA: Diagnosis present

## 2023-03-13 DIAGNOSIS — E1122 Type 2 diabetes mellitus with diabetic chronic kidney disease: Secondary | ICD-10-CM | POA: Diagnosis present

## 2023-03-13 DIAGNOSIS — J9602 Acute respiratory failure with hypercapnia: Secondary | ICD-10-CM | POA: Diagnosis not present

## 2023-03-13 DIAGNOSIS — N184 Chronic kidney disease, stage 4 (severe): Secondary | ICD-10-CM | POA: Diagnosis not present

## 2023-03-13 DIAGNOSIS — R4182 Altered mental status, unspecified: Secondary | ICD-10-CM | POA: Diagnosis not present

## 2023-03-13 DIAGNOSIS — R0602 Shortness of breath: Secondary | ICD-10-CM | POA: Diagnosis not present

## 2023-03-13 DIAGNOSIS — I517 Cardiomegaly: Secondary | ICD-10-CM | POA: Diagnosis not present

## 2023-03-13 DIAGNOSIS — G9341 Metabolic encephalopathy: Secondary | ICD-10-CM | POA: Diagnosis not present

## 2023-03-13 LAB — BASIC METABOLIC PANEL
Anion gap: 11 (ref 5–15)
BUN: 123 mg/dL — ABNORMAL HIGH (ref 8–23)
CO2: 36 mmol/L — ABNORMAL HIGH (ref 22–32)
Calcium: 9.4 mg/dL (ref 8.9–10.3)
Chloride: 88 mmol/L — ABNORMAL LOW (ref 98–111)
Creatinine, Ser: 3 mg/dL — ABNORMAL HIGH (ref 0.61–1.24)
GFR, Estimated: 21 mL/min — ABNORMAL LOW (ref >60)
Glucose, Bld: 143 mg/dL — ABNORMAL HIGH (ref 70–99)
Potassium: 4 mmol/L (ref 3.5–5.1)
Sodium: 135 mmol/L (ref 135–145)

## 2023-03-13 LAB — GLUCOSE, CAPILLARY
Glucose-Capillary: 109 mg/dL — ABNORMAL HIGH (ref 70–99)
Glucose-Capillary: 236 mg/dL — ABNORMAL HIGH (ref 70–99)

## 2023-03-13 MED ORDER — INSULIN GLARGINE-YFGN 100 UNIT/ML ~~LOC~~ SOLN: 10.0000 [IU] | Freq: Every day | SUBCUTANEOUS | Status: DC

## 2023-03-13 MED ORDER — INSULIN GLARGINE-YFGN 100 UNIT/ML ~~LOC~~ SOLN
20.0000 [IU] | Freq: Every day | SUBCUTANEOUS | Status: DC
Start: 2023-03-13 — End: 2023-03-13
  Administered 2023-03-13: 20 [IU] via SUBCUTANEOUS
  Filled 2023-03-13: qty 0.2

## 2023-03-13 MED ORDER — TORSEMIDE 20 MG PO TABS: 40.0000 mg | ORAL_TABLET | Freq: Every day | ORAL | Status: DC

## 2023-03-13 MED ORDER — PREDNISONE 20 MG PO TABS: 20.0000 mg | ORAL_TABLET | Freq: Every day | ORAL | 0 refills | Status: AC

## 2023-03-13 MED ORDER — SENNOSIDES-DOCUSATE SODIUM 8.6-50 MG PO TABS
1.0000 | ORAL_TABLET | Freq: Two times a day (BID) | ORAL | Status: DC
  Administered 2023-03-13: 1 via ORAL
  Filled 2023-03-13: qty 1

## 2023-03-13 MED ORDER — MIDODRINE HCL 5 MG PO TABS: 5.0000 mg | ORAL_TABLET | Freq: Two times a day (BID) | ORAL | Status: DC

## 2023-03-13 MED ORDER — POLYETHYLENE GLYCOL 3350 17 G PO PACK: 17.0000 g | PACK | Freq: Every day | ORAL | 0 refills | Status: DC | PRN

## 2023-03-13 MED ORDER — PROPRANOLOL HCL ER 60 MG PO CP24: 60.0000 mg | ORAL_CAPSULE | Freq: Every day | ORAL | 0 refills | Status: DC

## 2023-03-13 NOTE — Discharge Summary (Signed)
Weight:       187.8 lb Date of Birth:  January 27, 1946    BSA:          1.947 m Patient Age:    77 years     BP:           144/114 mmHg Patient Gender: M            HR:           60 bpm. Exam Location:  Inpatient Procedure: 2D Echo, Cardiac Doppler, Color Doppler and Intracardiac            Opacification Agent Indications:    CHF  History:        Patient has prior history of Echocardiogram examinations, most                 recent 06/23/2022. CHF, CAD, Arrythmias:Atrial Fibrillation; Risk                 Factors:Hypertension, Diabetes and Dyslipidemia.  Sonographer:    Corey [name] Referring Phys: [id number] Corey [name] IMPRESSIONS  1. Sluggish flow at the LV apex but not thrombus. Left ventricular ejection fraction, by estimation, is 20 to 25%. Left ventricular ejection fraction by 2D MOD biplane is 20.8 %. The left ventricle has severely decreased function. The  left ventricle demonstrates global hypokinesis. There is severe concentric left ventricular hypertrophy. Left ventricular diastolic function could not be evaluated.  2. Right ventricular systolic function is severely reduced. The right ventricular size is mildly enlarged. There is mildly elevated pulmonary artery systolic pressure. The estimated right ventricular systolic pressure is 41.8 mmHg.  3. Left atrial size was mildly dilated.  4. Right atrial size was moderately dilated.  5. The mitral valve is grossly normal. Mild mitral valve regurgitation. No evidence of mitral stenosis.  6. The aortic valve is tricuspid. Aortic valve regurgitation is not visualized. No aortic stenosis is present.  7. There is mild dilatation of the ascending aorta, measuring 40 mm.  8. The inferior vena cava is dilated in size with <50% respiratory variability, suggesting right atrial pressure of 15 mmHg. Comparison(s): Changes from prior study are noted. The left ventricular function is worsened. The right ventricular systolic function is worse. FINDINGS  Left Ventricle: Sluggish flow at the LV apex but not thrombus. Left ventricular ejection fraction, by estimation, is 20 to 25%. Left ventricular ejection fraction by 2D MOD biplane is 20.8 %. The left ventricle has severely decreased function. The left ventricle demonstrates global hypokinesis. Definity contrast agent was given IV to delineate the left ventricular endocardial borders. The left ventricular internal cavity size was normal in size. There is severe concentric left ventricular hypertrophy. Left ventricular diastolic function could not be evaluated due to atrial fibrillation. Left ventricular diastolic function could not be evaluated. Right Ventricle: The right ventricular size is mildly enlarged. No increase in right ventricular wall thickness. Right ventricular systolic function is severely reduced. There is mildly elevated pulmonary artery systolic pressure. The  tricuspid regurgitant velocity is 2.59 m/s, and with an assumed right atrial pressure of 15 mmHg, the estimated right ventricular systolic pressure is 41.8 mmHg. Left Atrium: Left atrial size was mildly dilated. Right Atrium: Right atrial size was moderately dilated. Pericardium: There is no evidence of pericardial effusion. Mitral Valve: The mitral valve is grossly normal. There is mild thickening of the anterior and posterior mitral valve leaflet(s). Mild mitral valve regurgitation. No evidence of mitral valve stenosis. Tricuspid Valve: The tricuspid valve is grossly normal.  Physician Discharge Summary  Corey [name] QVZ:[id number] DOB: 24-Feb-1946 DOA: 02/27/2023  PCP: Corey Sanes, MD  Admit date: 02/27/2023 Discharge date: 03/13/2023  Time spent: 45 minutes  Recommendations for Outpatient Follow-up:  Advanced heart failure clinic on 11/11 SNF for short-term rehab Nephrology Dr. Malen [name], [facility] in 1 month PCP in 1 week, wean off propranolol as tolerated Vascular surgery evaluation as outpatient for thrombosed left arm AV fistula   Discharge Diagnoses:  Principal Problem:   Acute on chronic systolic CHF (congestive heart failure) (HCC) Active Problems:   Acute renal failure superimposed on stage 4 chronic kidney disease (HCC)   Paroxysmal atrial fibrillation (HCC)   Essential hypertension   CAD (coronary artery disease)   Non-insulin dependent type 2 diabetes mellitus (HCC)   Right shoulder pain   Prostate cancer (HCC) Gout Essential tremors  Discharge Condition: Improved  Diet recommendation: Low-sodium, heart healthy, diabetic  Filed Weights   03/11/23 0408 03/12/23 0300 03/13/23 0348  Weight: 75 kg 76.7 kg 77.2 kg    History of present illness:   77/M w cardiac amyloidosis, heart failure, pericardial effusion (sp pericardial window), atrial fibrillation, CVA, CKD stage IV, T2DM and prior cardiac arrest (PEA), who presented with lower extremity edema and dyspnea for about 2 weeks, associated with orthopnea, cough, and 10 lbs weight gain. On the day of hospitalization he was evaluated at the cardiology clinic, pre op evaluation for HD access, he was found volume overloaded and was referred to the ED for further evaluation.  Na 141, bun 73, cr 3,14, hgb 11,6 plt 140  CXR w/ hypoinflation, cardiomegaly, bilateral hilar vascular congestion, small left pleural effusion, pacemaker with one right atrial and one right ventricular lead.   10/17 patient placed on IV furosemide, continue volume overloaded.  10/18 improved diuresis.   10/19 tolerating well IV furosemide, but continue volume overloaded.  10/21 volume status has improved. Vascular surgery recommended follow up as outpatient for AV fistula.  10/22 nephrology consulted, Lasix increased to 120 Mg every 6 10/25: Diuretics changed to torsemide, gout flare, declined by CIR 10/26,-prednisone for gout flare, low-dose propranolol for tremors  Hospital Course:   Acute on chronic systolic CHF (congestive heart failure) (HCC) -Echo with EF 20 to 25%, global hypokinesis, severe LVH, RV with severe reduction in systolic function, RA with moderate dilatation,  -diuresed with IV Lasix, complicated by worsening renal function, seen by nephrology, stabilized and improved with high-dose diuretics -He is 20.2 L negative, weight down 29 LB, creatinine plateaued in the 2.8-3 range, transitioned to torsemide 40 Mg daily, also required low-dose midodrine this admission for soft/low BPs, now stabilized -GDMT limited by CKD 4 -CIR declined by insurance, he will now be discharged to SNF for short-term rehab -Follow-up at up in heart failure clinic   Acute hypoxemic respiratory due to acute cardiogenic pulmonary edema.  -Cardiac amyloidosis, continue with tafamidis.  -Follow-up with heart failure team -Weaned off O2    Acute renal failure superimposed on stage 4 chronic kidney disease (HCC) -Baseline creatinine 2.8-3.3, creatinine peaked at 3.3, now improving -Appreciate nephrology input, 1 status improved, now on torsemide -Midodrine, avoid hypotension -Has thrombosed left arm AV fistula, Dr. Ella Jubilee discussed with vascular surgery, they recommended outpatient follow-up as no plans for urgent renal replacement therapy at this time   Anemia of chronic renal disease, with hgb at 10,9 -Stable, has mild pancytopenia as well could be secondary to amyloidosis/tafamidis   Paroxysmal atrial fibrillation (HCC) 12/28/22 CRT pacemaker implantation, BiV pacing -  Weight:       187.8 lb Date of Birth:  January 27, 1946    BSA:          1.947 m Patient Age:    77 years     BP:           144/114 mmHg Patient Gender: M            HR:           60 bpm. Exam Location:  Inpatient Procedure: 2D Echo, Cardiac Doppler, Color Doppler and Intracardiac            Opacification Agent Indications:    CHF  History:        Patient has prior history of Echocardiogram examinations, most                 recent 06/23/2022. CHF, CAD, Arrythmias:Atrial Fibrillation; Risk                 Factors:Hypertension, Diabetes and Dyslipidemia.  Sonographer:    Corey [name] Referring Phys: [id number] Corey [name] IMPRESSIONS  1. Sluggish flow at the LV apex but not thrombus. Left ventricular ejection fraction, by estimation, is 20 to 25%. Left ventricular ejection fraction by 2D MOD biplane is 20.8 %. The left ventricle has severely decreased function. The  left ventricle demonstrates global hypokinesis. There is severe concentric left ventricular hypertrophy. Left ventricular diastolic function could not be evaluated.  2. Right ventricular systolic function is severely reduced. The right ventricular size is mildly enlarged. There is mildly elevated pulmonary artery systolic pressure. The estimated right ventricular systolic pressure is 41.8 mmHg.  3. Left atrial size was mildly dilated.  4. Right atrial size was moderately dilated.  5. The mitral valve is grossly normal. Mild mitral valve regurgitation. No evidence of mitral stenosis.  6. The aortic valve is tricuspid. Aortic valve regurgitation is not visualized. No aortic stenosis is present.  7. There is mild dilatation of the ascending aorta, measuring 40 mm.  8. The inferior vena cava is dilated in size with <50% respiratory variability, suggesting right atrial pressure of 15 mmHg. Comparison(s): Changes from prior study are noted. The left ventricular function is worsened. The right ventricular systolic function is worse. FINDINGS  Left Ventricle: Sluggish flow at the LV apex but not thrombus. Left ventricular ejection fraction, by estimation, is 20 to 25%. Left ventricular ejection fraction by 2D MOD biplane is 20.8 %. The left ventricle has severely decreased function. The left ventricle demonstrates global hypokinesis. Definity contrast agent was given IV to delineate the left ventricular endocardial borders. The left ventricular internal cavity size was normal in size. There is severe concentric left ventricular hypertrophy. Left ventricular diastolic function could not be evaluated due to atrial fibrillation. Left ventricular diastolic function could not be evaluated. Right Ventricle: The right ventricular size is mildly enlarged. No increase in right ventricular wall thickness. Right ventricular systolic function is severely reduced. There is mildly elevated pulmonary artery systolic pressure. The  tricuspid regurgitant velocity is 2.59 m/s, and with an assumed right atrial pressure of 15 mmHg, the estimated right ventricular systolic pressure is 41.8 mmHg. Left Atrium: Left atrial size was mildly dilated. Right Atrium: Right atrial size was moderately dilated. Pericardium: There is no evidence of pericardial effusion. Mitral Valve: The mitral valve is grossly normal. There is mild thickening of the anterior and posterior mitral valve leaflet(s). Mild mitral valve regurgitation. No evidence of mitral valve stenosis. Tricuspid Valve: The tricuspid valve is grossly normal.  Weight:       187.8 lb Date of Birth:  January 27, 1946    BSA:          1.947 m Patient Age:    77 years     BP:           144/114 mmHg Patient Gender: M            HR:           60 bpm. Exam Location:  Inpatient Procedure: 2D Echo, Cardiac Doppler, Color Doppler and Intracardiac            Opacification Agent Indications:    CHF  History:        Patient has prior history of Echocardiogram examinations, most                 recent 06/23/2022. CHF, CAD, Arrythmias:Atrial Fibrillation; Risk                 Factors:Hypertension, Diabetes and Dyslipidemia.  Sonographer:    Corey [name] Referring Phys: [id number] Corey [name] IMPRESSIONS  1. Sluggish flow at the LV apex but not thrombus. Left ventricular ejection fraction, by estimation, is 20 to 25%. Left ventricular ejection fraction by 2D MOD biplane is 20.8 %. The left ventricle has severely decreased function. The  left ventricle demonstrates global hypokinesis. There is severe concentric left ventricular hypertrophy. Left ventricular diastolic function could not be evaluated.  2. Right ventricular systolic function is severely reduced. The right ventricular size is mildly enlarged. There is mildly elevated pulmonary artery systolic pressure. The estimated right ventricular systolic pressure is 41.8 mmHg.  3. Left atrial size was mildly dilated.  4. Right atrial size was moderately dilated.  5. The mitral valve is grossly normal. Mild mitral valve regurgitation. No evidence of mitral stenosis.  6. The aortic valve is tricuspid. Aortic valve regurgitation is not visualized. No aortic stenosis is present.  7. There is mild dilatation of the ascending aorta, measuring 40 mm.  8. The inferior vena cava is dilated in size with <50% respiratory variability, suggesting right atrial pressure of 15 mmHg. Comparison(s): Changes from prior study are noted. The left ventricular function is worsened. The right ventricular systolic function is worse. FINDINGS  Left Ventricle: Sluggish flow at the LV apex but not thrombus. Left ventricular ejection fraction, by estimation, is 20 to 25%. Left ventricular ejection fraction by 2D MOD biplane is 20.8 %. The left ventricle has severely decreased function. The left ventricle demonstrates global hypokinesis. Definity contrast agent was given IV to delineate the left ventricular endocardial borders. The left ventricular internal cavity size was normal in size. There is severe concentric left ventricular hypertrophy. Left ventricular diastolic function could not be evaluated due to atrial fibrillation. Left ventricular diastolic function could not be evaluated. Right Ventricle: The right ventricular size is mildly enlarged. No increase in right ventricular wall thickness. Right ventricular systolic function is severely reduced. There is mildly elevated pulmonary artery systolic pressure. The  tricuspid regurgitant velocity is 2.59 m/s, and with an assumed right atrial pressure of 15 mmHg, the estimated right ventricular systolic pressure is 41.8 mmHg. Left Atrium: Left atrial size was mildly dilated. Right Atrium: Right atrial size was moderately dilated. Pericardium: There is no evidence of pericardial effusion. Mitral Valve: The mitral valve is grossly normal. There is mild thickening of the anterior and posterior mitral valve leaflet(s). Mild mitral valve regurgitation. No evidence of mitral valve stenosis. Tricuspid Valve: The tricuspid valve is grossly normal.  Physician Discharge Summary  Corey [name] QVZ:[id number] DOB: 24-Feb-1946 DOA: 02/27/2023  PCP: Corey Sanes, MD  Admit date: 02/27/2023 Discharge date: 03/13/2023  Time spent: 45 minutes  Recommendations for Outpatient Follow-up:  Advanced heart failure clinic on 11/11 SNF for short-term rehab Nephrology Dr. Malen [name], [facility] in 1 month PCP in 1 week, wean off propranolol as tolerated Vascular surgery evaluation as outpatient for thrombosed left arm AV fistula   Discharge Diagnoses:  Principal Problem:   Acute on chronic systolic CHF (congestive heart failure) (HCC) Active Problems:   Acute renal failure superimposed on stage 4 chronic kidney disease (HCC)   Paroxysmal atrial fibrillation (HCC)   Essential hypertension   CAD (coronary artery disease)   Non-insulin dependent type 2 diabetes mellitus (HCC)   Right shoulder pain   Prostate cancer (HCC) Gout Essential tremors  Discharge Condition: Improved  Diet recommendation: Low-sodium, heart healthy, diabetic  Filed Weights   03/11/23 0408 03/12/23 0300 03/13/23 0348  Weight: 75 kg 76.7 kg 77.2 kg    History of present illness:   77/M w cardiac amyloidosis, heart failure, pericardial effusion (sp pericardial window), atrial fibrillation, CVA, CKD stage IV, T2DM and prior cardiac arrest (PEA), who presented with lower extremity edema and dyspnea for about 2 weeks, associated with orthopnea, cough, and 10 lbs weight gain. On the day of hospitalization he was evaluated at the cardiology clinic, pre op evaluation for HD access, he was found volume overloaded and was referred to the ED for further evaluation.  Na 141, bun 73, cr 3,14, hgb 11,6 plt 140  CXR w/ hypoinflation, cardiomegaly, bilateral hilar vascular congestion, small left pleural effusion, pacemaker with one right atrial and one right ventricular lead.   10/17 patient placed on IV furosemide, continue volume overloaded.  10/18 improved diuresis.   10/19 tolerating well IV furosemide, but continue volume overloaded.  10/21 volume status has improved. Vascular surgery recommended follow up as outpatient for AV fistula.  10/22 nephrology consulted, Lasix increased to 120 Mg every 6 10/25: Diuretics changed to torsemide, gout flare, declined by CIR 10/26,-prednisone for gout flare, low-dose propranolol for tremors  Hospital Course:   Acute on chronic systolic CHF (congestive heart failure) (HCC) -Echo with EF 20 to 25%, global hypokinesis, severe LVH, RV with severe reduction in systolic function, RA with moderate dilatation,  -diuresed with IV Lasix, complicated by worsening renal function, seen by nephrology, stabilized and improved with high-dose diuretics -He is 20.2 L negative, weight down 29 LB, creatinine plateaued in the 2.8-3 range, transitioned to torsemide 40 Mg daily, also required low-dose midodrine this admission for soft/low BPs, now stabilized -GDMT limited by CKD 4 -CIR declined by insurance, he will now be discharged to SNF for short-term rehab -Follow-up at up in heart failure clinic   Acute hypoxemic respiratory due to acute cardiogenic pulmonary edema.  -Cardiac amyloidosis, continue with tafamidis.  -Follow-up with heart failure team -Weaned off O2    Acute renal failure superimposed on stage 4 chronic kidney disease (HCC) -Baseline creatinine 2.8-3.3, creatinine peaked at 3.3, now improving -Appreciate nephrology input, 1 status improved, now on torsemide -Midodrine, avoid hypotension -Has thrombosed left arm AV fistula, Dr. Ella Jubilee discussed with vascular surgery, they recommended outpatient follow-up as no plans for urgent renal replacement therapy at this time   Anemia of chronic renal disease, with hgb at 10,9 -Stable, has mild pancytopenia as well could be secondary to amyloidosis/tafamidis   Paroxysmal atrial fibrillation (HCC) 12/28/22 CRT pacemaker implantation, BiV pacing -  by mouth daily. Start taking on: March 14, 2023   rosuvastatin 5 MG tablet Commonly known as: CRESTOR Take 1 tablet (5 mg total) by mouth daily.   sennosides-docusate sodium 8.6-50 MG tablet Commonly known as: SENOKOT-S Take 2 tablets by mouth daily as needed for constipation.   sodium bicarbonate 650 MG tablet TAKE 1 TABLET BY MOUTH TWICE DAILY . APPOINTMENT REQUIRED FOR FUTURE REFILLS   torsemide 20 MG tablet Commonly known as: DEMADEX Take 2 tablets (40 mg  total) by mouth daily. What changed:  how much to take when to take this   TRUEplus Lancets 33G Misc USE 4 (FOUR) TIMES DAILY   Vyndamax 61 MG Caps Generic drug: Tafamidis Take 1 capsule (61 mg total) by mouth daily.       Allergies  Allergen Reactions   Hydrochlorothiazide Other (See Comments)    Gout , uncontrolled diabetes and renal insufficiency    Contact information for follow-up providers     [facility] Follow up.   Specialty: Home Health Services Why: Physical therapy and Occupational therapy services. Office will call to arrange follow up after discharge. Contact information: [address] [place] [address] [phone]         [facility] Follow up on 03/26/2023.   Specialty: Cardiology Why: 11/11 1030 Contact information: [address] [place] [address] [phone]             Contact information for after-discharge care     Destination     [facility] Preferred SNF .   Service: Skilled Nursing Contact information: [address] [place] [address] [phone]                      The results of significant diagnostics from this hospitalization (including imaging, microbiology, ancillary and laboratory) are listed below for reference.    Significant Diagnostic Studies: VAS [place] LOWER EXTREMITY VENOUS (DVT)  Result Date: 02/28/2023  Lower Venous DVT Study Patient Name:  Corey [name]  Date of Exam:   02/28/2023 Medical Rec #: [id number]     Accession #:    [id number] Date of Birth: [birth year]     Patient Gender: M Patient Age:   84 years Exam Location:  [facility] Procedure:      VAS [place] LOWER EXTREMITY VENOUS (DVT) Referring Phys: Ulyess Blossom [name] --------------------------------------------------------------------------------  Indications: Swelling, and Edema.  Risk Factors: Surgery 12/28/22 pacemaker  implant. Comparison Study: No prior study Performing Technologist: Shona Simpson  Examination Guidelines: A complete evaluation includes B-mode imaging, spectral Doppler, color Doppler, and power Doppler as needed of all accessible portions of each vessel. Bilateral testing is considered an integral part of a complete examination. Limited examinations for reoccurring indications may be performed as noted. The reflux portion of the exam is performed with the patient in reverse Trendelenburg.  +---------+---------------+---------+-----------+----------+--------------+ RIGHT    CompressibilityPhasicitySpontaneityPropertiesThrombus Aging +---------+---------------+---------+-----------+----------+--------------+ CFV      Full           Yes      Yes                                 +---------+---------------+---------+-----------+----------+--------------+ SFJ      Full                                                        +---------+---------------+---------+-----------+----------+--------------+  by mouth daily. Start taking on: March 14, 2023   rosuvastatin 5 MG tablet Commonly known as: CRESTOR Take 1 tablet (5 mg total) by mouth daily.   sennosides-docusate sodium 8.6-50 MG tablet Commonly known as: SENOKOT-S Take 2 tablets by mouth daily as needed for constipation.   sodium bicarbonate 650 MG tablet TAKE 1 TABLET BY MOUTH TWICE DAILY . APPOINTMENT REQUIRED FOR FUTURE REFILLS   torsemide 20 MG tablet Commonly known as: DEMADEX Take 2 tablets (40 mg  total) by mouth daily. What changed:  how much to take when to take this   TRUEplus Lancets 33G Misc USE 4 (FOUR) TIMES DAILY   Vyndamax 61 MG Caps Generic drug: Tafamidis Take 1 capsule (61 mg total) by mouth daily.       Allergies  Allergen Reactions   Hydrochlorothiazide Other (See Comments)    Gout , uncontrolled diabetes and renal insufficiency    Contact information for follow-up providers     [facility], [facility] Follow up.   Specialty: Home Health Services Why: Physical therapy and Occupational therapy services. Office will call to arrange follow up after discharge. Contact information: [address] [place] [address] [phone]         [facility] Follow up on 03/26/2023.   Specialty: Cardiology Why: 11/11 1030 Contact information: [address] [place] [address] [phone]             Contact information for after-discharge care     Destination     [facility] Preferred SNF .   Service: Skilled Nursing Contact information: [address] [place] [address] [phone]                      The results of significant diagnostics from this hospitalization (including imaging, microbiology, ancillary and laboratory) are listed below for reference.    Significant Diagnostic Studies: VAS [place] LOWER EXTREMITY VENOUS (DVT)  Result Date: 02/28/2023  Lower Venous DVT Study Patient Name:  Corey [name]  Date of Exam:   02/28/2023 Medical Rec #: [id number]     Accession #:    [id number] Date of Birth: [birth year]     Patient Gender: M Patient Age:   84 years Exam Location:  [facility] Procedure:      VAS [place] LOWER EXTREMITY VENOUS (DVT) Referring Phys: Ulyess Blossom [name] --------------------------------------------------------------------------------  Indications: Swelling, and Edema.  Risk Factors: Surgery 12/28/22 pacemaker  implant. Comparison Study: No prior study Performing Technologist: Shona Simpson  Examination Guidelines: A complete evaluation includes B-mode imaging, spectral Doppler, color Doppler, and power Doppler as needed of all accessible portions of each vessel. Bilateral testing is considered an integral part of a complete examination. Limited examinations for reoccurring indications may be performed as noted. The reflux portion of the exam is performed with the patient in reverse Trendelenburg.  +---------+---------------+---------+-----------+----------+--------------+ RIGHT    CompressibilityPhasicitySpontaneityPropertiesThrombus Aging +---------+---------------+---------+-----------+----------+--------------+ CFV      Full           Yes      Yes                                 +---------+---------------+---------+-----------+----------+--------------+ SFJ      Full                                                        +---------+---------------+---------+-----------+----------+--------------+  Physician Discharge Summary  Corey [name] QVZ:[id number] DOB: 24-Feb-1946 DOA: 02/27/2023  PCP: Corey Sanes, MD  Admit date: 02/27/2023 Discharge date: 03/13/2023  Time spent: 45 minutes  Recommendations for Outpatient Follow-up:  Advanced heart failure clinic on 11/11 SNF for short-term rehab Nephrology Dr. Malen [name], [facility] in 1 month PCP in 1 week, wean off propranolol as tolerated Vascular surgery evaluation as outpatient for thrombosed left arm AV fistula   Discharge Diagnoses:  Principal Problem:   Acute on chronic systolic CHF (congestive heart failure) (HCC) Active Problems:   Acute renal failure superimposed on stage 4 chronic kidney disease (HCC)   Paroxysmal atrial fibrillation (HCC)   Essential hypertension   CAD (coronary artery disease)   Non-insulin dependent type 2 diabetes mellitus (HCC)   Right shoulder pain   Prostate cancer (HCC) Gout Essential tremors  Discharge Condition: Improved  Diet recommendation: Low-sodium, heart healthy, diabetic  Filed Weights   03/11/23 0408 03/12/23 0300 03/13/23 0348  Weight: 75 kg 76.7 kg 77.2 kg    History of present illness:   77/M w cardiac amyloidosis, heart failure, pericardial effusion (sp pericardial window), atrial fibrillation, CVA, CKD stage IV, T2DM and prior cardiac arrest (PEA), who presented with lower extremity edema and dyspnea for about 2 weeks, associated with orthopnea, cough, and 10 lbs weight gain. On the day of hospitalization he was evaluated at the cardiology clinic, pre op evaluation for HD access, he was found volume overloaded and was referred to the ED for further evaluation.  Na 141, bun 73, cr 3,14, hgb 11,6 plt 140  CXR w/ hypoinflation, cardiomegaly, bilateral hilar vascular congestion, small left pleural effusion, pacemaker with one right atrial and one right ventricular lead.   10/17 patient placed on IV furosemide, continue volume overloaded.  10/18 improved diuresis.   10/19 tolerating well IV furosemide, but continue volume overloaded.  10/21 volume status has improved. Vascular surgery recommended follow up as outpatient for AV fistula.  10/22 nephrology consulted, Lasix increased to 120 Mg every 6 10/25: Diuretics changed to torsemide, gout flare, declined by CIR 10/26,-prednisone for gout flare, low-dose propranolol for tremors  Hospital Course:   Acute on chronic systolic CHF (congestive heart failure) (HCC) -Echo with EF 20 to 25%, global hypokinesis, severe LVH, RV with severe reduction in systolic function, RA with moderate dilatation,  -diuresed with IV Lasix, complicated by worsening renal function, seen by nephrology, stabilized and improved with high-dose diuretics -He is 20.2 L negative, weight down 29 LB, creatinine plateaued in the 2.8-3 range, transitioned to torsemide 40 Mg daily, also required low-dose midodrine this admission for soft/low BPs, now stabilized -GDMT limited by CKD 4 -CIR declined by insurance, he will now be discharged to SNF for short-term rehab -Follow-up at up in heart failure clinic   Acute hypoxemic respiratory due to acute cardiogenic pulmonary edema.  -Cardiac amyloidosis, continue with tafamidis.  -Follow-up with heart failure team -Weaned off O2    Acute renal failure superimposed on stage 4 chronic kidney disease (HCC) -Baseline creatinine 2.8-3.3, creatinine peaked at 3.3, now improving -Appreciate nephrology input, 1 status improved, now on torsemide -Midodrine, avoid hypotension -Has thrombosed left arm AV fistula, Dr. Ella Jubilee discussed with vascular surgery, they recommended outpatient follow-up as no plans for urgent renal replacement therapy at this time   Anemia of chronic renal disease, with hgb at 10,9 -Stable, has mild pancytopenia as well could be secondary to amyloidosis/tafamidis   Paroxysmal atrial fibrillation (HCC) 12/28/22 CRT pacemaker implantation, BiV pacing -

## 2023-03-13 NOTE — Progress Notes (Signed)
Specialty Pharmacy Refill Coordination Note  Corey [name] is a 77 y.o. male contacted today regarding refills of specialty medication(s) Tafamidis   Patient requested Delivery   Delivery date: 03/16/23   Verified address: [address] [place] [address]   Medication will be filled on 03/15/23.

## 2023-03-13 NOTE — Progress Notes (Signed)
Gave report to [place] at [facility]. All questions answered. PTAR pick up time is around 4-5pm.

## 2023-03-13 NOTE — Plan of Care (Signed)
  Problem: Education: Goal: Understanding of CV disease, CV risk reduction, and recovery process will improve Outcome: Progressing Goal: Individualized Educational Video(s) Outcome: Progressing   Problem: Activity: Goal: Ability to return to baseline activity level will improve Outcome: Progressing   Problem: Cardiovascular: Goal: Ability to achieve and maintain adequate cardiovascular perfusion will improve Outcome: Progressing Goal: Vascular access site(s) Level 0-1 will be maintained Outcome: Progressing   Problem: Health Behavior/Discharge Planning: Goal: Ability to safely manage health-related needs after discharge will improve Outcome: Progressing   Problem: Education: Goal: Knowledge of cardiac device and self-care will improve Outcome: Progressing Goal: Ability to safely manage health related needs after discharge will improve Outcome: Progressing Goal: Individualized Educational Video(s) Outcome: Progressing   Problem: Cardiac: Goal: Ability to achieve and maintain adequate cardiopulmonary perfusion will improve Outcome: Progressing   Problem: Education: Goal: Understanding of disease, treatment, and recovery process will improve Outcome: Progressing   Problem: Activity: Goal: Ability to return to baseline activity level will improve Outcome: Progressing   Problem: Cardiac: Goal: Ability to maintain adequate cardiovascular perfusion will improve Outcome: Progressing Goal: Vascular access site(s) Level 0-1 will be maintained Outcome: Progressing   Problem: Health Behavior/ Discharge Planning: Goal: Ability to safely manage health related needs after discharge Outcome: Progressing   Problem: Education: Goal: Knowledge of General Education information will improve Description: Including pain rating scale, medication(s)/side effects and non-pharmacologic comfort measures Outcome: Progressing   Problem: Health Behavior/Discharge Planning: Goal: Ability to  manage health-related needs will improve Outcome: Progressing   Problem: Clinical Measurements: Goal: Ability to maintain clinical measurements within normal limits will improve Outcome: Progressing Goal: Will remain free from infection Outcome: Progressing Goal: Diagnostic test results will improve Outcome: Progressing Goal: Respiratory complications will improve Outcome: Progressing Goal: Cardiovascular complication will be avoided Outcome: Progressing   Problem: Activity: Goal: Risk for activity intolerance will decrease Outcome: Progressing   Problem: Nutrition: Goal: Adequate nutrition will be maintained Outcome: Progressing   Problem: Coping: Goal: Level of anxiety will decrease Outcome: Progressing   Problem: Elimination: Goal: Will not experience complications related to bowel motility Outcome: Progressing Goal: Will not experience complications related to urinary retention Outcome: Progressing   Problem: Pain Managment: Goal: General experience of comfort will improve Outcome: Progressing   Problem: Safety: Goal: Ability to remain free from injury will improve Outcome: Progressing   Problem: Skin Integrity: Goal: Risk for impaired skin integrity will decrease Outcome: Progressing   Problem: Education: Goal: Ability to demonstrate management of disease process will improve Outcome: Progressing Goal: Ability to verbalize understanding of medication therapies will improve Outcome: Progressing Goal: Individualized Educational Video(s) Outcome: Progressing   Problem: Activity: Goal: Capacity to carry out activities will improve Outcome: Progressing   Problem: Cardiac: Goal: Ability to achieve and maintain adequate cardiopulmonary perfusion will improve Outcome: Progressing   Problem: Education: Goal: Ability to describe self-care measures that may prevent or decrease complications (Diabetes Survival Skills Education) will improve Outcome:  Progressing Goal: Individualized Educational Video(s) Outcome: Progressing   Problem: Coping: Goal: Ability to adjust to condition or change in health will improve Outcome: Progressing

## 2023-03-13 NOTE — Care Management Important Message (Signed)
Important Message  Patient Details  Name: Corey [name] MRN: [id number] Date of Birth: 1945-10-19   Important Message Given:  Yes - Medicare IM     Dorena Bodo 03/13/2023, 2:36 PM

## 2023-03-13 NOTE — Progress Notes (Signed)
Mobility Specialist Progress Note:   03/13/23 1000  Mobility  Activity Ambulated with assistance in hallway  Level of Assistance Moderate assist, patient does 50-74%  Assistive Device Front wheel walker  Distance Ambulated (ft) 100 ft  Activity Response Tolerated well  Mobility Referral Yes  $Mobility charge 1 Mobility  Mobility Specialist Start Time (ACUTE ONLY) [id number]  Mobility Specialist Stop Time (ACUTE ONLY) 0951  Mobility Specialist Time Calculation (min) (ACUTE ONLY) 25 min    Pre Mobility: 68 HR,  96% SpO2 Post Mobility:  65 HR,  92% SpO2  Pt received on EOB, agreeable to mobility w/ encouragement. ModA for STS and requiring verbal cues for walker management d/t tendency to veer left. Asymptomatic w/ no complaints throughout. Pt left in chair with call bell and chair alarm on. RN present.  D'Vante Earlene Plater Mobility Specialist Please contact via Special educational needs teacher or Rehab office at [phone]

## 2023-03-13 NOTE — TOC Transition Note (Signed)
Transition of Care [facility]) - CM/SW Discharge Note   Patient Details  Name: Corey [name] MRN: [id number] Date of Birth: [birth year]  Transition of Care [facility]) CM/SW Contact:  Deatra Robinson, [place] Phone Number: 03/13/2023, 1:47 PM   Clinical Narrative:  pt for dc to [facility] today. Spoke to Kia in admissions who confirmed they are prepared to admit pt to room 124A. Pt's wife aware of dc and reports agreeable. RN provided with number for report and PTAR arranged for transport. SW signing off at dc.   Dellie Burns, MSW, LCSW [phone] (coverage)       Final next level of care: Skilled Nursing Facility Barriers to Discharge: Barriers Resolved   Patient Goals and CMS Choice      Discharge Placement                Patient chooses bed at: [facility] Patient to be transferred to facility by: PTAR Name of family member notified: Gwyndowlyn/wife Patient and family notified of of transfer: 03/13/23  Discharge Plan and Services Additional resources added to the After Visit Summary for                            [facility] Arranged: PT, OT [facility] Agency: [facility] Date [facility] Agency Contacted: 03/03/23 Time HH Agency Contacted: 0840 Representative spoke with at [facility] Agency: Kandee Keen  Social Determinants of Health (SDOH) Interventions SDOH Screenings   Food Insecurity: No Food Insecurity (03/01/2023)  Housing: Low Risk  (03/01/2023)  Transportation Needs: No Transportation Needs (03/01/2023)  Utilities: Not At Risk (03/01/2023)  Alcohol Screen: Low Risk  (02/08/2023)  Depression (PHQ2-9): Low Risk  (02/08/2023)  Financial Resource Strain: Low Risk  (02/08/2023)  Physical Activity: Insufficiently Active (02/08/2023)  Social Connections: Socially Integrated (02/08/2023)  Stress: No Stress Concern Present (02/08/2023)  Tobacco Use: Medium Risk (02/27/2023)  Health Literacy: Adequate Health Literacy (02/08/2023)     Readmission Risk  Interventions    09/30/2020    2:29 PM  Readmission Risk Prevention Plan  Transportation Screening Complete  PCP or Specialist Appt within 5-7 Days Complete  Home Care Screening Complete  Medication Review (RN CM) Complete

## 2023-03-14 ENCOUNTER — Inpatient Hospital Stay (HOSPITAL_COMMUNITY)
Admission: EM | Admit: 2023-03-14 | Discharge: 2023-04-15 | DRG: 291 | Disposition: E | Payer: Medicare HMO | Source: Ambulatory Visit | Attending: Student | Admitting: Student

## 2023-03-14 ENCOUNTER — Emergency Department (HOSPITAL_COMMUNITY): Payer: Medicare HMO

## 2023-03-14 DIAGNOSIS — E872 Acidosis, unspecified: Secondary | ICD-10-CM | POA: Diagnosis not present

## 2023-03-14 DIAGNOSIS — I5043 Acute on chronic combined systolic (congestive) and diastolic (congestive) heart failure: Secondary | ICD-10-CM | POA: Diagnosis present

## 2023-03-14 DIAGNOSIS — J9602 Acute respiratory failure with hypercapnia: Secondary | ICD-10-CM | POA: Diagnosis not present

## 2023-03-14 DIAGNOSIS — I132 Hypertensive heart and chronic kidney disease with heart failure and with stage 5 chronic kidney disease, or end stage renal disease: Principal | ICD-10-CM | POA: Diagnosis present

## 2023-03-14 DIAGNOSIS — Z8042 Family history of malignant neoplasm of prostate: Secondary | ICD-10-CM

## 2023-03-14 DIAGNOSIS — I959 Hypotension, unspecified: Secondary | ICD-10-CM | POA: Insufficient documentation

## 2023-03-14 DIAGNOSIS — Z6826 Body mass index (BMI) 26.0-26.9, adult: Secondary | ICD-10-CM

## 2023-03-14 DIAGNOSIS — I272 Pulmonary hypertension, unspecified: Secondary | ICD-10-CM | POA: Diagnosis present

## 2023-03-14 DIAGNOSIS — E854 Organ-limited amyloidosis: Secondary | ICD-10-CM | POA: Diagnosis present

## 2023-03-14 DIAGNOSIS — I2510 Atherosclerotic heart disease of native coronary artery without angina pectoris: Secondary | ICD-10-CM | POA: Diagnosis present

## 2023-03-14 DIAGNOSIS — T8383XA Hemorrhage of genitourinary prosthetic devices, implants and grafts, initial encounter: Secondary | ICD-10-CM | POA: Diagnosis not present

## 2023-03-14 DIAGNOSIS — I9589 Other hypotension: Secondary | ICD-10-CM | POA: Diagnosis present

## 2023-03-14 DIAGNOSIS — G4733 Obstructive sleep apnea (adult) (pediatric): Secondary | ICD-10-CM | POA: Diagnosis present

## 2023-03-14 DIAGNOSIS — Z66 Do not resuscitate: Secondary | ICD-10-CM | POA: Diagnosis not present

## 2023-03-14 DIAGNOSIS — D61818 Other pancytopenia: Secondary | ICD-10-CM | POA: Diagnosis present

## 2023-03-14 DIAGNOSIS — R627 Adult failure to thrive: Secondary | ICD-10-CM | POA: Diagnosis present

## 2023-03-14 DIAGNOSIS — R310 Gross hematuria: Secondary | ICD-10-CM | POA: Diagnosis not present

## 2023-03-14 DIAGNOSIS — Z87440 Personal history of urinary (tract) infections: Secondary | ICD-10-CM

## 2023-03-14 DIAGNOSIS — R680 Hypothermia, not associated with low environmental temperature: Secondary | ICD-10-CM | POA: Diagnosis present

## 2023-03-14 DIAGNOSIS — Z515 Encounter for palliative care: Secondary | ICD-10-CM

## 2023-03-14 DIAGNOSIS — E11649 Type 2 diabetes mellitus with hypoglycemia without coma: Secondary | ICD-10-CM | POA: Diagnosis present

## 2023-03-14 DIAGNOSIS — I5020 Unspecified systolic (congestive) heart failure: Secondary | ICD-10-CM | POA: Diagnosis not present

## 2023-03-14 DIAGNOSIS — R404 Transient alteration of awareness: Secondary | ICD-10-CM | POA: Diagnosis not present

## 2023-03-14 DIAGNOSIS — Z87891 Personal history of nicotine dependence: Secondary | ICD-10-CM

## 2023-03-14 DIAGNOSIS — I5021 Acute systolic (congestive) heart failure: Secondary | ICD-10-CM

## 2023-03-14 DIAGNOSIS — E1122 Type 2 diabetes mellitus with diabetic chronic kidney disease: Secondary | ICD-10-CM | POA: Diagnosis present

## 2023-03-14 DIAGNOSIS — R319 Hematuria, unspecified: Secondary | ICD-10-CM | POA: Insufficient documentation

## 2023-03-14 DIAGNOSIS — R4182 Altered mental status, unspecified: Secondary | ICD-10-CM | POA: Diagnosis not present

## 2023-03-14 DIAGNOSIS — I509 Heart failure, unspecified: Secondary | ICD-10-CM | POA: Diagnosis not present

## 2023-03-14 DIAGNOSIS — N185 Chronic kidney disease, stage 5: Secondary | ICD-10-CM | POA: Diagnosis present

## 2023-03-14 DIAGNOSIS — C61 Malignant neoplasm of prostate: Secondary | ICD-10-CM | POA: Diagnosis present

## 2023-03-14 DIAGNOSIS — E162 Hypoglycemia, unspecified: Principal | ICD-10-CM

## 2023-03-14 DIAGNOSIS — L89322 Pressure ulcer of left buttock, stage 2: Secondary | ICD-10-CM | POA: Diagnosis present

## 2023-03-14 DIAGNOSIS — I43 Cardiomyopathy in diseases classified elsewhere: Secondary | ICD-10-CM | POA: Diagnosis present

## 2023-03-14 DIAGNOSIS — N184 Chronic kidney disease, stage 4 (severe): Secondary | ICD-10-CM | POA: Diagnosis present

## 2023-03-14 DIAGNOSIS — I5042 Chronic combined systolic (congestive) and diastolic (congestive) heart failure: Secondary | ICD-10-CM | POA: Diagnosis not present

## 2023-03-14 DIAGNOSIS — J9601 Acute respiratory failure with hypoxia: Secondary | ICD-10-CM

## 2023-03-14 DIAGNOSIS — R54 Age-related physical debility: Secondary | ICD-10-CM | POA: Diagnosis present

## 2023-03-14 DIAGNOSIS — D631 Anemia in chronic kidney disease: Secondary | ICD-10-CM | POA: Diagnosis present

## 2023-03-14 DIAGNOSIS — E43 Unspecified severe protein-calorie malnutrition: Secondary | ICD-10-CM | POA: Diagnosis present

## 2023-03-14 DIAGNOSIS — G40909 Epilepsy, unspecified, not intractable, without status epilepticus: Secondary | ICD-10-CM | POA: Diagnosis present

## 2023-03-14 DIAGNOSIS — R339 Retention of urine, unspecified: Secondary | ICD-10-CM | POA: Diagnosis present

## 2023-03-14 DIAGNOSIS — R401 Stupor: Secondary | ICD-10-CM

## 2023-03-14 DIAGNOSIS — R918 Other nonspecific abnormal finding of lung field: Secondary | ICD-10-CM | POA: Diagnosis not present

## 2023-03-14 DIAGNOSIS — E875 Hyperkalemia: Secondary | ICD-10-CM | POA: Diagnosis present

## 2023-03-14 DIAGNOSIS — Z8673 Personal history of transient ischemic attack (TIA), and cerebral infarction without residual deficits: Secondary | ICD-10-CM

## 2023-03-14 DIAGNOSIS — Z833 Family history of diabetes mellitus: Secondary | ICD-10-CM

## 2023-03-14 DIAGNOSIS — L8990 Pressure ulcer of unspecified site, unspecified stage: Secondary | ICD-10-CM | POA: Diagnosis not present

## 2023-03-14 DIAGNOSIS — Z7901 Long term (current) use of anticoagulants: Secondary | ICD-10-CM

## 2023-03-14 DIAGNOSIS — J441 Chronic obstructive pulmonary disease with (acute) exacerbation: Secondary | ICD-10-CM | POA: Diagnosis not present

## 2023-03-14 DIAGNOSIS — Z79899 Other long term (current) drug therapy: Secondary | ICD-10-CM

## 2023-03-14 DIAGNOSIS — R531 Weakness: Secondary | ICD-10-CM | POA: Diagnosis not present

## 2023-03-14 DIAGNOSIS — Y732 Prosthetic and other implants, materials and accessory gastroenterology and urology devices associated with adverse incidents: Secondary | ICD-10-CM | POA: Diagnosis not present

## 2023-03-14 DIAGNOSIS — R410 Disorientation, unspecified: Secondary | ICD-10-CM | POA: Diagnosis not present

## 2023-03-14 DIAGNOSIS — Z860100 Personal history of colon polyps, unspecified: Secondary | ICD-10-CM

## 2023-03-14 DIAGNOSIS — G9341 Metabolic encephalopathy: Secondary | ICD-10-CM | POA: Diagnosis present

## 2023-03-14 DIAGNOSIS — L89312 Pressure ulcer of right buttock, stage 2: Secondary | ICD-10-CM | POA: Diagnosis present

## 2023-03-14 DIAGNOSIS — R0902 Hypoxemia: Secondary | ICD-10-CM | POA: Diagnosis not present

## 2023-03-14 DIAGNOSIS — R0989 Other specified symptoms and signs involving the circulatory and respiratory systems: Secondary | ICD-10-CM | POA: Diagnosis not present

## 2023-03-14 DIAGNOSIS — R0602 Shortness of breath: Secondary | ICD-10-CM | POA: Diagnosis not present

## 2023-03-14 DIAGNOSIS — I252 Old myocardial infarction: Secondary | ICD-10-CM

## 2023-03-14 DIAGNOSIS — I5082 Biventricular heart failure: Secondary | ICD-10-CM | POA: Diagnosis present

## 2023-03-14 DIAGNOSIS — J9622 Acute and chronic respiratory failure with hypercapnia: Secondary | ICD-10-CM | POA: Diagnosis not present

## 2023-03-14 DIAGNOSIS — Z8546 Personal history of malignant neoplasm of prostate: Secondary | ICD-10-CM

## 2023-03-14 DIAGNOSIS — J9811 Atelectasis: Secondary | ICD-10-CM | POA: Diagnosis not present

## 2023-03-14 DIAGNOSIS — Z7189 Other specified counseling: Secondary | ICD-10-CM | POA: Diagnosis not present

## 2023-03-14 DIAGNOSIS — I5023 Acute on chronic systolic (congestive) heart failure: Secondary | ICD-10-CM | POA: Diagnosis not present

## 2023-03-14 DIAGNOSIS — Z978 Presence of other specified devices: Secondary | ICD-10-CM | POA: Diagnosis not present

## 2023-03-14 DIAGNOSIS — I4892 Unspecified atrial flutter: Secondary | ICD-10-CM | POA: Diagnosis present

## 2023-03-14 DIAGNOSIS — N289 Disorder of kidney and ureter, unspecified: Secondary | ICD-10-CM

## 2023-03-14 DIAGNOSIS — E119 Type 2 diabetes mellitus without complications: Secondary | ICD-10-CM

## 2023-03-14 DIAGNOSIS — E161 Other hypoglycemia: Secondary | ICD-10-CM | POA: Diagnosis not present

## 2023-03-14 DIAGNOSIS — Z794 Long term (current) use of insulin: Secondary | ICD-10-CM

## 2023-03-14 DIAGNOSIS — I6782 Cerebral ischemia: Secondary | ICD-10-CM | POA: Diagnosis not present

## 2023-03-14 DIAGNOSIS — I517 Cardiomegaly: Secondary | ICD-10-CM | POA: Diagnosis not present

## 2023-03-14 DIAGNOSIS — Z888 Allergy status to other drugs, medicaments and biological substances status: Secondary | ICD-10-CM

## 2023-03-14 DIAGNOSIS — Z8249 Family history of ischemic heart disease and other diseases of the circulatory system: Secondary | ICD-10-CM

## 2023-03-14 DIAGNOSIS — Z8674 Personal history of sudden cardiac arrest: Secondary | ICD-10-CM

## 2023-03-14 DIAGNOSIS — R8271 Bacteriuria: Secondary | ICD-10-CM | POA: Diagnosis present

## 2023-03-14 DIAGNOSIS — R55 Syncope and collapse: Secondary | ICD-10-CM | POA: Diagnosis not present

## 2023-03-14 DIAGNOSIS — M109 Gout, unspecified: Secondary | ICD-10-CM | POA: Diagnosis present

## 2023-03-14 DIAGNOSIS — J9621 Acute and chronic respiratory failure with hypoxia: Secondary | ICD-10-CM | POA: Diagnosis not present

## 2023-03-14 DIAGNOSIS — I10 Essential (primary) hypertension: Secondary | ICD-10-CM | POA: Diagnosis present

## 2023-03-14 DIAGNOSIS — G3184 Mild cognitive impairment, so stated: Secondary | ICD-10-CM | POA: Diagnosis present

## 2023-03-14 DIAGNOSIS — I480 Paroxysmal atrial fibrillation: Secondary | ICD-10-CM | POA: Diagnosis present

## 2023-03-14 DIAGNOSIS — R0603 Acute respiratory distress: Secondary | ICD-10-CM | POA: Diagnosis not present

## 2023-03-14 LAB — I-STAT VENOUS BLOOD GAS, ED
Acid-Base Excess: 10 mmol/L — ABNORMAL HIGH (ref 0.0–2.0)
Bicarbonate: 40.1 mmol/L — ABNORMAL HIGH (ref 20.0–28.0)
Calcium, Ion: 0.94 mmol/L — ABNORMAL LOW (ref 1.15–1.40)
HCT: 48 % (ref 39.0–52.0)
Hemoglobin: 16.3 g/dL (ref 13.0–17.0)
O2 Saturation: 23 %
Potassium: >8.5 mmol/L (ref 3.5–5.1)
Sodium: 127 mmol/L — ABNORMAL LOW (ref 135–145)
TCO2: 42 mmol/L — ABNORMAL HIGH (ref 22–32)
pCO2, Ven: 75.6 mm[Hg] (ref 44–60)
pH, Ven: 7.333 (ref 7.25–7.43)
pO2, Ven: 19 mm[Hg] — CL (ref 32–45)

## 2023-03-14 LAB — CBG MONITORING, ED
Glucose-Capillary: 157 mg/dL — ABNORMAL HIGH (ref 70–99)
Glucose-Capillary: 343 mg/dL — ABNORMAL HIGH (ref 70–99)
Glucose-Capillary: 74 mg/dL (ref 70–99)
Glucose-Capillary: 90 mg/dL (ref 70–99)
Glucose-Capillary: <10 mg/dL — CL (ref 70–99)
Glucose-Capillary: <10 mg/dL — CL (ref 70–99)
Glucose-Capillary: <10 mg/dL — CL (ref 70–99)

## 2023-03-14 LAB — CBC
HCT: 47.1 % (ref 39.0–52.0)
Hemoglobin: 14.5 g/dL (ref 13.0–17.0)
MCH: 25.6 pg — ABNORMAL LOW (ref 26.0–34.0)
MCHC: 30.8 g/dL (ref 30.0–36.0)
MCV: 83.2 fL (ref 80.0–100.0)
Platelets: 301 10*3/uL (ref 150–400)
RBC: 5.66 MIL/uL (ref 4.22–5.81)
RDW: 19.8 % — ABNORMAL HIGH (ref 11.5–15.5)
WBC: 4.5 10*3/uL (ref 4.0–10.5)
nRBC: 0 % (ref 0.0–0.2)

## 2023-03-14 LAB — BASIC METABOLIC PANEL
Anion gap: 16 — ABNORMAL HIGH (ref 5–15)
BUN: 127 mg/dL — ABNORMAL HIGH (ref 8–23)
CO2: 31 mmol/L (ref 22–32)
Calcium: 9.4 mg/dL (ref 8.9–10.3)
Chloride: 88 mmol/L — ABNORMAL LOW (ref 98–111)
Creatinine, Ser: 2.94 mg/dL — ABNORMAL HIGH (ref 0.61–1.24)
GFR, Estimated: 22 mL/min — ABNORMAL LOW (ref >60)
Glucose, Bld: 119 mg/dL — ABNORMAL HIGH (ref 70–99)
Potassium: 5.5 mmol/L — ABNORMAL HIGH (ref 3.5–5.1)
Sodium: 135 mmol/L (ref 135–145)

## 2023-03-14 LAB — HEPATIC FUNCTION PANEL
ALT: 41 U/L (ref 0–44)
AST: 103 U/L — ABNORMAL HIGH (ref 15–41)
Albumin: 2.8 g/dL — ABNORMAL LOW (ref 3.5–5.0)
Alkaline Phosphatase: 106 U/L (ref 38–126)
Bilirubin, Direct: 1.3 mg/dL — ABNORMAL HIGH (ref 0.0–0.2)
Indirect Bilirubin: 1.1 mg/dL — ABNORMAL HIGH (ref 0.3–0.9)
Total Bilirubin: 2.4 mg/dL — ABNORMAL HIGH (ref 0.3–1.2)
Total Protein: 7.3 g/dL (ref 6.5–8.1)

## 2023-03-14 LAB — ETHANOL: Alcohol, Ethyl (B): <10 mg/dL (ref <10)

## 2023-03-14 MED ORDER — VANCOMYCIN HCL 1500 MG/300ML IV SOLN
1500.0000 mg | Freq: Once | INTRAVENOUS | Status: AC
Start: 2023-03-15 — End: 2023-03-15
  Administered 2023-03-15: 1500 mg via INTRAVENOUS
  Filled 2023-03-14: qty 300

## 2023-03-14 MED ORDER — DEXTROSE 50 % IV SOLN
INTRAVENOUS | Status: AC
  Administered 2023-03-14: 100 mL
  Filled 2023-03-14: qty 50

## 2023-03-14 MED ORDER — SODIUM CHLORIDE 0.9 % IV SOLN
50.0000 ug/h | INTRAVENOUS | Status: DC
  Filled 2023-03-14: qty 1

## 2023-03-14 MED ORDER — METRONIDAZOLE 500 MG/100ML IV SOLN
500.0000 mg | Freq: Once | INTRAVENOUS | Status: AC
  Administered 2023-03-15: 500 mg via INTRAVENOUS
  Filled 2023-03-14: qty 100

## 2023-03-14 MED ORDER — GLUCAGON HCL RDNA (DIAGNOSTIC) 1 MG IJ SOLR
1.0000 mg | Freq: Once | INTRAMUSCULAR | Status: AC
  Administered 2023-03-15: 1 mg via INTRAVENOUS
  Filled 2023-03-14: qty 1

## 2023-03-14 MED ORDER — DEXTROSE 10 % IV SOLN: INTRAVENOUS | Status: DC

## 2023-03-14 MED ORDER — VANCOMYCIN HCL IN DEXTROSE 1-5 GM/200ML-% IV SOLN
1000.0000 mg | Freq: Once | INTRAVENOUS | Status: DC
  Filled 2023-03-14: qty 200

## 2023-03-14 MED ORDER — OCTREOTIDE LOAD VIA INFUSION
100.0000 ug | Freq: Once | INTRAVENOUS | Status: DC
  Filled 2023-03-14: qty 50

## 2023-03-14 MED ORDER — OCTREOTIDE ACETATE 100 MCG/ML IJ SOLN
100.0000 ug | Freq: Four times a day (QID) | INTRAMUSCULAR | Status: DC
  Filled 2023-03-14 (×2): qty 1

## 2023-03-14 MED ORDER — FUROSEMIDE 10 MG/ML IJ SOLN
40.0000 mg | Freq: Once | INTRAMUSCULAR | Status: AC
  Administered 2023-03-15: 40 mg via INTRAVENOUS
  Filled 2023-03-14: qty 4

## 2023-03-14 MED ORDER — HYDROCORTISONE SOD SUC (PF) 100 MG IJ SOLR: 100.0000 mg | Freq: Two times a day (BID) | INTRAMUSCULAR | Status: DC

## 2023-03-14 MED ORDER — SODIUM CHLORIDE 0.9 % IV SOLN
2.0000 g | Freq: Once | INTRAVENOUS | Status: AC
  Administered 2023-03-15: 2 g via INTRAVENOUS
  Filled 2023-03-14: qty 12.5

## 2023-03-14 MED ORDER — DEXTROSE-SODIUM CHLORIDE 10-0.45 % IV SOLN
INTRAVENOUS | Status: DC
  Filled 2023-03-14 (×4): qty 1000

## 2023-03-14 MED ORDER — OCTREOTIDE ACETATE 50 MCG/ML IJ SOLN: 75.0000 ug | INTRAMUSCULAR | Status: DC

## 2023-03-14 MED ORDER — ALBUTEROL SULFATE (2.5 MG/3ML) 0.083% IN NEBU
10.0000 mg | INHALATION_SOLUTION | Freq: Once | RESPIRATORY_TRACT | Status: AC
  Administered 2023-03-15: 10 mg via RESPIRATORY_TRACT
  Filled 2023-03-14: qty 12

## 2023-03-14 MED ORDER — DEXTROSE 50 % IV SOLN
INTRAVENOUS | Status: AC
  Administered 2023-03-15: 50 mL
  Filled 2023-03-14: qty 50

## 2023-03-14 NOTE — ED Notes (Signed)
Patient transported to CT 

## 2023-03-14 NOTE — ED Provider Notes (Signed)
[place] EMERGENCY DEPARTMENT AT [facility] Provider Note   CSN: [id number] Arrival date & time: 03/14/23  2143     History {Add pertinent medical, surgical, social history, OB history to HPI:1} Chief Complaint  Patient presents with   Hypoglycemia    Corey [name] is a 77 y.o. male.  77 year old male with a history of CKD stage V (not currently on dialysis), diabetes, prior cardiac arrest, cardiac amyloidosis, pericardial effusion status post window, A-fib on Eliquis, and CVA who presents emergency department with altered mental status.  Patient presenting from [facility] with altered mental status and low blood sugar.  Reports that he was found altered and they checked his blood sugar and it was in the 20s.  Was given glucagon.  Was then brought in to the emergency department for evaluation.  Does take Semglee 10 units  in the morning.  Additional history obtained by the patient's wife. Says that this morning when he woke up he was in his normal state of health.  At noon started become lethargic and she started feeding him.  By 4 PM was obtunded and they checked his blood sugar and it was 41 and brought him into the emergency department.       Home Medications Prior to Admission medications   Medication Sig Start Date End Date Taking? Authorizing Provider  acetaminophen (TYLENOL) 500 MG tablet Take 1,000 mg by mouth every 8 (eight) hours as needed for moderate pain.    [provider]  albuterol (VENTOLIN HFA) 108 (90 Base) MCG/ACT inhaler Inhale 2 puffs into the lungs every 4 (four) hours as needed for wheezing or shortness of breath. 11/14/21   Valinda Hoar, NP  allopurinol (ZYLOPRIM) 100 MG tablet Take 1/2 (one-half) tablet by mouth once daily 01/18/23   Pincus Sanes, MD  apixaban (ELIQUIS) 5 MG TABS tablet Take 1 tablet by mouth twice daily 01/29/23   Mealor, Roberts Gaudy, MD  bismuth subsalicylate (PEPTO BISMOL) 262 MG/15ML suspension Take 30 mLs by  mouth every 6 (six) hours as needed for indigestion or diarrhea or loose stools.    [provider]  cholecalciferol (VITAMIN D3) 25 MCG (1000 UNIT) tablet Take 1,000 Units by mouth daily.    [provider]  glucose blood (ACCU-CHEK AVIVA PLUS) test strip Use to check blood glucose 4 times a day. 12/25/22   Reather Littler, MD  insulin glargine-yfgn (SEMGLEE) 100 UNIT/ML injection Inject 0.1 mLs (10 Units total) into the skin daily. 03/14/23   Zannie Cove, MD  midodrine (PROAMATINE) 5 MG tablet Take 1 tablet (5 mg total) by mouth 2 (two) times daily with a meal. 03/13/23   Zannie Cove, MD  polyethylene glycol (MIRALAX / GLYCOLAX) 17 g packet Take 17 g by mouth daily as needed. 03/13/23   Zannie Cove, MD  predniSONE (DELTASONE) 20 MG tablet Take 1 tablet (20 mg total) by mouth daily with breakfast for 2 days. 03/14/23 03/16/23  Zannie Cove, MD  propranolol ER (INDERAL LA) 60 MG 24 hr capsule Take 1 capsule (60 mg total) by mouth daily. 03/14/23   Zannie Cove, MD  rosuvastatin (CRESTOR) 5 MG tablet Take 1 tablet (5 mg total) by mouth daily. 06/26/22   Mealor, Roberts Gaudy, MD  sennosides-docusate sodium (SENOKOT-S) 8.6-50 MG tablet Take 2 tablets by mouth daily as needed for constipation.    [provider]  sodium bicarbonate 650 MG tablet TAKE 1 TABLET BY MOUTH TWICE DAILY . APPOINTMENT REQUIRED FOR FUTURE REFILLS  01/18/23   Pincus Sanes, MD  Tafamidis (VYNDAMAX) 61 MG CAPS Take 1 capsule (61 mg total) by mouth daily. 06/19/22   Jake Bathe, MD  torsemide (DEMADEX) 20 MG tablet Take 2 tablets (40 mg total) by mouth daily. 03/13/23   Zannie Cove, MD  TRUEplus Lancets 33G MISC USE 4 (FOUR) TIMES DAILY 04/04/21   Reather Littler, MD      Allergies    Hydrochlorothiazide    Review of Systems   Review of Systems  Physical Exam Updated Vital Signs SpO2 98%  Physical Exam Vitals and nursing note reviewed.  Constitutional:      General: He is in acute  distress.     Appearance: He is well-developed. He is ill-appearing.     Comments: Arouses to painful stimuli  HENT:     Head: Normocephalic and atraumatic.     Right Ear: External ear normal.     Left Ear: External ear normal.     Nose: Nose normal.     Mouth/Throat:     Mouth: Mucous membranes are dry.     Pharynx: Oropharynx is clear.  Eyes:     Extraocular Movements: Extraocular movements intact.     Conjunctiva/sclera: Conjunctivae normal.     Pupils: Pupils are equal, round, and reactive to light.     Comments: Pupils 4 mm bilaterally  Cardiovascular:     Rate and Rhythm: Normal rate and regular rhythm.     Heart sounds: Normal heart sounds.  Pulmonary:     Effort: Pulmonary effort is normal. No respiratory distress.     Comments: Tachypneic Abdominal:     General: There is no distension.     Palpations: Abdomen is soft. There is no mass.     Tenderness: There is no abdominal tenderness. There is no guarding.  Musculoskeletal:     Cervical back: Normal range of motion and neck supple.     Right lower leg: No edema.     Left lower leg: No edema.  Skin:    General: Skin is warm and dry.  Neurological:     Comments: Moves all 4 extremities but is altered.  After receiving 2 A of D50 was able to say his name.     ED Results / Procedures / Treatments   Labs (all labs ordered are listed, but only abnormal results are displayed) Labs Reviewed  CBG MONITORING, ED - Abnormal; Notable for the following components:      Result Value   Glucose-Capillary <10 (*)    All other components within normal limits    EKG None  Radiology No results found.  Procedures Procedures  {Document cardiac monitor, telemetry assessment procedure when appropriate:1}  Medications Ordered in ED Medications  dextrose 50 % solution (has no administration in time range)    ED Course/ Medical Decision Making/ A&P Clinical Course as of 03/14/23 2229  Wed Mar 14, 2023  2228 Attempted to  call patient's wife and [facility] without response.  [RP]    Clinical Course User Index [RP] Rondel Baton, MD   {   Click here for ABCD2, HEART and other calculatorsREFRESH Note before signing :1}                              Medical Decision Making Amount and/or Complexity of Data Reviewed Labs: ordered. Radiology: ordered.  Risk Prescription drug management. Decision regarding hospitalization.   ***  {Document  critical care time when appropriate:1} {Document review of labs and clinical decision tools ie heart score, Chads2Vasc2 etc:1}  {Document your independent review of radiology images, and any outside records:1} {Document your discussion with family members, caretakers, and with consultants:1} {Document social determinants of health affecting pt's care:1} {Document your decision making why or why not admission, treatments were needed:1} Final Clinical Impression(s) / ED Diagnoses Final diagnoses:  None    Rx / DC Orders ED Discharge Orders     None

## 2023-03-15 DIAGNOSIS — E875 Hyperkalemia: Secondary | ICD-10-CM

## 2023-03-15 DIAGNOSIS — N185 Chronic kidney disease, stage 5: Secondary | ICD-10-CM | POA: Diagnosis present

## 2023-03-15 DIAGNOSIS — E162 Hypoglycemia, unspecified: Secondary | ICD-10-CM | POA: Diagnosis not present

## 2023-03-15 DIAGNOSIS — J9601 Acute respiratory failure with hypoxia: Secondary | ICD-10-CM | POA: Diagnosis not present

## 2023-03-15 DIAGNOSIS — I5042 Chronic combined systolic (congestive) and diastolic (congestive) heart failure: Secondary | ICD-10-CM | POA: Diagnosis not present

## 2023-03-15 DIAGNOSIS — J9811 Atelectasis: Secondary | ICD-10-CM | POA: Diagnosis not present

## 2023-03-15 DIAGNOSIS — Z7189 Other specified counseling: Secondary | ICD-10-CM | POA: Diagnosis not present

## 2023-03-15 DIAGNOSIS — J441 Chronic obstructive pulmonary disease with (acute) exacerbation: Secondary | ICD-10-CM | POA: Diagnosis not present

## 2023-03-15 DIAGNOSIS — T8383XA Hemorrhage of genitourinary prosthetic devices, implants and grafts, initial encounter: Secondary | ICD-10-CM | POA: Diagnosis not present

## 2023-03-15 DIAGNOSIS — E1122 Type 2 diabetes mellitus with diabetic chronic kidney disease: Secondary | ICD-10-CM | POA: Diagnosis present

## 2023-03-15 DIAGNOSIS — D631 Anemia in chronic kidney disease: Secondary | ICD-10-CM | POA: Diagnosis present

## 2023-03-15 DIAGNOSIS — E43 Unspecified severe protein-calorie malnutrition: Secondary | ICD-10-CM | POA: Diagnosis present

## 2023-03-15 DIAGNOSIS — N289 Disorder of kidney and ureter, unspecified: Secondary | ICD-10-CM | POA: Diagnosis not present

## 2023-03-15 DIAGNOSIS — I43 Cardiomyopathy in diseases classified elsewhere: Secondary | ICD-10-CM | POA: Diagnosis present

## 2023-03-15 DIAGNOSIS — J9621 Acute and chronic respiratory failure with hypoxia: Secondary | ICD-10-CM | POA: Diagnosis not present

## 2023-03-15 DIAGNOSIS — I132 Hypertensive heart and chronic kidney disease with heart failure and with stage 5 chronic kidney disease, or end stage renal disease: Secondary | ICD-10-CM | POA: Diagnosis present

## 2023-03-15 DIAGNOSIS — E854 Organ-limited amyloidosis: Secondary | ICD-10-CM | POA: Diagnosis present

## 2023-03-15 DIAGNOSIS — G40909 Epilepsy, unspecified, not intractable, without status epilepticus: Secondary | ICD-10-CM | POA: Diagnosis present

## 2023-03-15 DIAGNOSIS — Z66 Do not resuscitate: Secondary | ICD-10-CM | POA: Diagnosis not present

## 2023-03-15 DIAGNOSIS — E872 Acidosis, unspecified: Secondary | ICD-10-CM | POA: Diagnosis not present

## 2023-03-15 DIAGNOSIS — L89322 Pressure ulcer of left buttock, stage 2: Secondary | ICD-10-CM | POA: Diagnosis present

## 2023-03-15 DIAGNOSIS — E11649 Type 2 diabetes mellitus with hypoglycemia without coma: Secondary | ICD-10-CM | POA: Diagnosis present

## 2023-03-15 DIAGNOSIS — N184 Chronic kidney disease, stage 4 (severe): Secondary | ICD-10-CM

## 2023-03-15 DIAGNOSIS — G9341 Metabolic encephalopathy: Secondary | ICD-10-CM | POA: Diagnosis present

## 2023-03-15 DIAGNOSIS — R4182 Altered mental status, unspecified: Secondary | ICD-10-CM | POA: Diagnosis present

## 2023-03-15 DIAGNOSIS — J9622 Acute and chronic respiratory failure with hypercapnia: Secondary | ICD-10-CM | POA: Diagnosis not present

## 2023-03-15 DIAGNOSIS — Y732 Prosthetic and other implants, materials and accessory gastroenterology and urology devices associated with adverse incidents: Secondary | ICD-10-CM | POA: Diagnosis not present

## 2023-03-15 DIAGNOSIS — I5020 Unspecified systolic (congestive) heart failure: Secondary | ICD-10-CM | POA: Diagnosis not present

## 2023-03-15 DIAGNOSIS — D61818 Other pancytopenia: Secondary | ICD-10-CM | POA: Diagnosis present

## 2023-03-15 DIAGNOSIS — J9602 Acute respiratory failure with hypercapnia: Secondary | ICD-10-CM | POA: Diagnosis not present

## 2023-03-15 DIAGNOSIS — I5043 Acute on chronic combined systolic (congestive) and diastolic (congestive) heart failure: Secondary | ICD-10-CM | POA: Diagnosis present

## 2023-03-15 DIAGNOSIS — I4892 Unspecified atrial flutter: Secondary | ICD-10-CM | POA: Diagnosis present

## 2023-03-15 DIAGNOSIS — Z515 Encounter for palliative care: Secondary | ICD-10-CM | POA: Diagnosis not present

## 2023-03-15 DIAGNOSIS — I5023 Acute on chronic systolic (congestive) heart failure: Secondary | ICD-10-CM | POA: Diagnosis not present

## 2023-03-15 DIAGNOSIS — I272 Pulmonary hypertension, unspecified: Secondary | ICD-10-CM | POA: Diagnosis present

## 2023-03-15 DIAGNOSIS — I5021 Acute systolic (congestive) heart failure: Secondary | ICD-10-CM | POA: Diagnosis not present

## 2023-03-15 DIAGNOSIS — L89312 Pressure ulcer of right buttock, stage 2: Secondary | ICD-10-CM | POA: Diagnosis present

## 2023-03-15 LAB — BRAIN NATRIURETIC PEPTIDE
B Natriuretic Peptide: >4500 pg/mL — ABNORMAL HIGH (ref 0.0–100.0)
B Natriuretic Peptide: >4500 pg/mL — ABNORMAL HIGH (ref 0.0–100.0)

## 2023-03-15 LAB — CBC
HCT: 34.2 % — ABNORMAL LOW (ref 39.0–52.0)
HCT: 43.7 % (ref 39.0–52.0)
Hemoglobin: 10.8 g/dL — ABNORMAL LOW (ref 13.0–17.0)
Hemoglobin: 13.2 g/dL (ref 13.0–17.0)
MCH: 25.6 pg — ABNORMAL LOW (ref 26.0–34.0)
MCH: 25.1 pg — ABNORMAL LOW (ref 26.0–34.0)
MCHC: 31.6 g/dL (ref 30.0–36.0)
MCHC: 30.2 g/dL (ref 30.0–36.0)
MCV: 81 fL (ref 80.0–100.0)
MCV: 83.1 fL (ref 80.0–100.0)
Platelets: 297 10*3/uL (ref 150–400)
Platelets: 205 10*3/uL (ref 150–400)
RBC: 4.22 MIL/uL (ref 4.22–5.81)
RBC: 5.26 MIL/uL (ref 4.22–5.81)
RDW: 18.8 % — ABNORMAL HIGH (ref 11.5–15.5)
RDW: 19.8 % — ABNORMAL HIGH (ref 11.5–15.5)
WBC: 4.4 10*3/uL (ref 4.0–10.5)
WBC: 3.6 10*3/uL — ABNORMAL LOW (ref 4.0–10.5)
nRBC: 0 % (ref 0.0–0.2)
nRBC: 0 % (ref 0.0–0.2)

## 2023-03-15 LAB — BASIC METABOLIC PANEL
Anion gap: 14 (ref 5–15)
Anion gap: 19 — ABNORMAL HIGH (ref 5–15)
Anion gap: 13 (ref 5–15)
BUN: 122 mg/dL — ABNORMAL HIGH (ref 8–23)
BUN: 123 mg/dL — ABNORMAL HIGH (ref 8–23)
BUN: 112 mg/dL — ABNORMAL HIGH (ref 8–23)
CO2: 29 mmol/L (ref 22–32)
CO2: 28 mmol/L (ref 22–32)
CO2: 28 mmol/L (ref 22–32)
Calcium: 9.2 mg/dL (ref 8.9–10.3)
Calcium: 9.3 mg/dL (ref 8.9–10.3)
Calcium: 10.4 mg/dL — ABNORMAL HIGH (ref 8.9–10.3)
Chloride: 89 mmol/L — ABNORMAL LOW (ref 98–111)
Chloride: 89 mmol/L — ABNORMAL LOW (ref 98–111)
Chloride: 93 mmol/L — ABNORMAL LOW (ref 98–111)
Creatinine, Ser: 2.94 mg/dL — ABNORMAL HIGH (ref 0.61–1.24)
Creatinine, Ser: 2.75 mg/dL — ABNORMAL HIGH (ref 0.61–1.24)
Creatinine, Ser: 2.66 mg/dL — ABNORMAL HIGH (ref 0.61–1.24)
GFR, Estimated: 22 mL/min — ABNORMAL LOW (ref >60)
GFR, Estimated: 23 mL/min — ABNORMAL LOW (ref >60)
GFR, Estimated: 24 mL/min — ABNORMAL LOW (ref >60)
Glucose, Bld: 226 mg/dL — ABNORMAL HIGH (ref 70–99)
Glucose, Bld: 151 mg/dL — ABNORMAL HIGH (ref 70–99)
Glucose, Bld: 230 mg/dL — ABNORMAL HIGH (ref 70–99)
Potassium: 4.4 mmol/L (ref 3.5–5.1)
Potassium: 5.8 mmol/L — ABNORMAL HIGH (ref 3.5–5.1)
Potassium: 3.7 mmol/L (ref 3.5–5.1)
Sodium: 132 mmol/L — ABNORMAL LOW (ref 135–145)
Sodium: 136 mmol/L (ref 135–145)
Sodium: 134 mmol/L — ABNORMAL LOW (ref 135–145)

## 2023-03-15 LAB — I-STAT ARTERIAL BLOOD GAS, ED
Acid-Base Excess: 7 mmol/L — ABNORMAL HIGH (ref 0.0–2.0)
Bicarbonate: 32.9 mmol/L — ABNORMAL HIGH (ref 20.0–28.0)
Calcium, Ion: 1.15 mmol/L (ref 1.15–1.40)
HCT: 43 % (ref 39.0–52.0)
Hemoglobin: 14.6 g/dL (ref 13.0–17.0)
O2 Saturation: 100 %
Patient temperature: 96.5
Potassium: 3.9 mmol/L (ref 3.5–5.1)
Sodium: 134 mmol/L — ABNORMAL LOW (ref 135–145)
TCO2: 34 mmol/L — ABNORMAL HIGH (ref 22–32)
pCO2 arterial: 49.3 mm[Hg] — ABNORMAL HIGH (ref 32–48)
pH, Arterial: 7.427 (ref 7.35–7.45)
pO2, Arterial: 422 mm[Hg] — ABNORMAL HIGH (ref 83–108)

## 2023-03-15 LAB — CBG MONITORING, ED
Glucose-Capillary: 290 mg/dL — ABNORMAL HIGH (ref 70–99)
Glucose-Capillary: 299 mg/dL — ABNORMAL HIGH (ref 70–99)

## 2023-03-15 LAB — URINALYSIS, ROUTINE W REFLEX MICROSCOPIC
Bilirubin Urine: NEGATIVE
Glucose, UA: NEGATIVE mg/dL
Ketones, ur: NEGATIVE mg/dL
Nitrite: NEGATIVE
Protein, ur: 100 mg/dL — AB
RBC / HPF: >50 RBC/hpf (ref 0–5)
Specific Gravity, Urine: 1.011 (ref 1.005–1.030)
pH: 6 (ref 5.0–8.0)

## 2023-03-15 LAB — GLUCOSE, CAPILLARY
Glucose-Capillary: 129 mg/dL — ABNORMAL HIGH (ref 70–99)
Glucose-Capillary: 140 mg/dL — ABNORMAL HIGH (ref 70–99)
Glucose-Capillary: 145 mg/dL — ABNORMAL HIGH (ref 70–99)
Glucose-Capillary: 147 mg/dL — ABNORMAL HIGH (ref 70–99)
Glucose-Capillary: 199 mg/dL — ABNORMAL HIGH (ref 70–99)
Glucose-Capillary: 26 mg/dL — CL (ref 70–99)
Glucose-Capillary: 260 mg/dL — ABNORMAL HIGH (ref 70–99)
Glucose-Capillary: 99 mg/dL (ref 70–99)

## 2023-03-15 LAB — CORTISOL: Cortisol, Plasma: 19.6 ug/dL

## 2023-03-15 LAB — MAGNESIUM: Magnesium: 3 mg/dL — ABNORMAL HIGH (ref 1.7–2.4)

## 2023-03-15 LAB — APTT: aPTT: 71 s — ABNORMAL HIGH (ref 24–36)

## 2023-03-15 LAB — T4, FREE: Free T4: 1.11 ng/dL (ref 0.61–1.12)

## 2023-03-15 LAB — AMMONIA: Ammonia: 53 umol/L — ABNORMAL HIGH (ref 9–35)

## 2023-03-15 LAB — MRSA NEXT GEN BY PCR, NASAL: MRSA by PCR Next Gen: NOT DETECTED

## 2023-03-15 LAB — TSH: TSH: 3.282 u[IU]/mL (ref 0.350–4.500)

## 2023-03-15 LAB — LACTIC ACID, PLASMA: Lactic Acid, Venous: 4.3 mmol/L (ref 0.5–1.9)

## 2023-03-15 LAB — PHOSPHORUS: Phosphorus: 6.2 mg/dL — ABNORMAL HIGH (ref 2.5–4.6)

## 2023-03-15 LAB — HEPARIN LEVEL (UNFRACTIONATED): Heparin Unfractionated: >1.1 [IU]/mL — ABNORMAL HIGH (ref 0.30–0.70)

## 2023-03-15 MED ORDER — INSULIN GLARGINE-YFGN 100 UNIT/ML ~~LOC~~ SOLN
10.0000 [IU] | Freq: Every day | SUBCUTANEOUS | Status: DC
  Filled 2023-03-15: qty 0.1

## 2023-03-15 MED ORDER — ONDANSETRON HCL 4 MG/2ML IJ SOLN
4.0000 mg | Freq: Four times a day (QID) | INTRAMUSCULAR | Status: DC | PRN
  Administered 2023-03-19: 4 mg via INTRAVENOUS
  Filled 2023-03-15: qty 2

## 2023-03-15 MED ORDER — SODIUM ZIRCONIUM CYCLOSILICATE 10 G PO PACK: 10.0000 g | PACK | Freq: Once | ORAL | Status: DC

## 2023-03-15 MED ORDER — SODIUM CHLORIDE 0.9 % IV SOLN
1.0000 g | INTRAVENOUS | Status: DC
  Administered 2023-03-15: 1 g via INTRAVENOUS
  Filled 2023-03-15 (×2): qty 10

## 2023-03-15 MED ORDER — DOCUSATE SODIUM 100 MG PO CAPS: 100.0000 mg | ORAL_CAPSULE | Freq: Two times a day (BID) | ORAL | Status: DC | PRN

## 2023-03-15 MED ORDER — TORSEMIDE 20 MG PO TABS: 40.0000 mg | ORAL_TABLET | Freq: Every day | ORAL | Status: DC

## 2023-03-15 MED ORDER — HEPARIN (PORCINE) 25000 UT/250ML-% IV SOLN
1000.0000 [IU]/h | INTRAVENOUS | Status: AC
  Administered 2023-03-15: 1000 [IU]/h via INTRAVENOUS
  Filled 2023-03-15: qty 250

## 2023-03-15 MED ORDER — APIXABAN 5 MG PO TABS
5.0000 mg | ORAL_TABLET | Freq: Two times a day (BID) | ORAL | Status: DC
  Administered 2023-03-15 – 2023-03-16 (×3): 5 mg via ORAL
  Filled 2023-03-15 (×3): qty 1

## 2023-03-15 MED ORDER — SODIUM CHLORIDE 0.9 % IV SOLN: 2.0000 g | INTRAVENOUS | Status: DC

## 2023-03-15 MED ORDER — ACETAMINOPHEN 325 MG PO TABS
650.0000 mg | ORAL_TABLET | ORAL | Status: DC | PRN
  Administered 2023-03-18: 650 mg via ORAL
  Filled 2023-03-15: qty 2

## 2023-03-15 MED ORDER — PROPRANOLOL HCL ER 60 MG PO CP24
60.0000 mg | ORAL_CAPSULE | Freq: Every day | ORAL | Status: DC
  Administered 2023-03-15 – 2023-03-16 (×2): 60 mg via ORAL
  Filled 2023-03-15 (×3): qty 1

## 2023-03-15 MED ORDER — HEPARIN SODIUM (PORCINE) 5000 UNIT/ML IJ SOLN: 5000.0000 [IU] | Freq: Three times a day (TID) | INTRAMUSCULAR | Status: DC

## 2023-03-15 MED ORDER — VANCOMYCIN HCL 1250 MG/250ML IV SOLN: 1250.0000 mg | INTRAVENOUS | Status: DC

## 2023-03-15 MED ORDER — FUROSEMIDE 10 MG/ML IJ SOLN
80.0000 mg | Freq: Once | INTRAMUSCULAR | Status: AC
Start: 2023-03-15 — End: 2023-03-15
  Administered 2023-03-15: 80 mg via INTRAVENOUS
  Filled 2023-03-15: qty 8

## 2023-03-15 MED ORDER — ORAL CARE MOUTH RINSE: 15.0000 mL | OROMUCOSAL | Status: DC | PRN

## 2023-03-15 MED ORDER — POLYETHYLENE GLYCOL 3350 17 G PO PACK: 17.0000 g | PACK | Freq: Every day | ORAL | Status: DC | PRN

## 2023-03-15 MED ORDER — INSULIN GLARGINE-YFGN 100 UNIT/ML ~~LOC~~ SOLN
10.0000 [IU] | Freq: Every day | SUBCUTANEOUS | Status: DC
  Administered 2023-03-15: 10 [IU] via SUBCUTANEOUS
  Filled 2023-03-15: qty 0.1

## 2023-03-15 MED ORDER — CHLORHEXIDINE GLUCONATE CLOTH 2 % EX PADS
6.0000 | MEDICATED_PAD | Freq: Every day | CUTANEOUS | Status: DC
  Administered 2023-03-15 – 2023-03-20 (×7): 6 via TOPICAL

## 2023-03-15 MED ORDER — DEXTROSE 50 % IV SOLN
INTRAVENOUS | Status: AC
  Administered 2023-03-15: 50 mL
  Filled 2023-03-15: qty 50

## 2023-03-15 MED ORDER — INSULIN ASPART 100 UNIT/ML IJ SOLN
0.0000 [IU] | Freq: Three times a day (TID) | INTRAMUSCULAR | Status: DC
  Administered 2023-03-15: 1 [IU] via SUBCUTANEOUS

## 2023-03-15 MED ORDER — CALCIUM GLUCONATE-NACL 2-0.675 GM/100ML-% IV SOLN
2.0000 g | Freq: Once | INTRAVENOUS | Status: AC
  Administered 2023-03-15: 2000 mg via INTRAVENOUS
  Filled 2023-03-15: qty 100

## 2023-03-15 MED ORDER — MIDODRINE HCL 5 MG PO TABS
5.0000 mg | ORAL_TABLET | Freq: Two times a day (BID) | ORAL | Status: DC
  Administered 2023-03-15 – 2023-03-18 (×5): 5 mg via ORAL
  Filled 2023-03-15 (×5): qty 1

## 2023-03-15 MED ORDER — POTASSIUM CHLORIDE CRYS ER 20 MEQ PO TBCR
40.0000 meq | EXTENDED_RELEASE_TABLET | Freq: Once | ORAL | Status: AC
  Administered 2023-03-15: 40 meq via ORAL
  Filled 2023-03-15: qty 2

## 2023-03-15 MED ORDER — INSULIN ASPART 100 UNIT/ML IJ SOLN: 0.0000 [IU] | Freq: Three times a day (TID) | INTRAMUSCULAR | Status: DC

## 2023-03-15 NOTE — Progress Notes (Signed)
Heart Failure Navigator Progress Note  Assessed for Heart & Vascular TOC clinic readiness.  Patient has a PA/NP Heart Failure Clinic Appointment on 03/26/23.  Navigator will sign off at this time.  Roxy Horseman, RN, BSN [facility] Heart Failure Navigator Secure Chat Only

## 2023-03-15 NOTE — Progress Notes (Signed)
PHARMACY - ANTICOAGULATION CONSULT NOTE  Pharmacy Consult for Heparin (Apixaban on hold) Indication: atrial fibrillation  Allergies  Allergen Reactions   Hydrochlorothiazide Other (See Comments)    Gout , uncontrolled diabetes and renal insufficiency    Patient Measurements: 77.2 kg  Vital Signs: Temp: 95.6 F (35.3 C) (10/30 2246) Temp Source: Rectal (10/30 2246) BP: 142/122 (10/31 0045) Pulse Rate: 60 (10/30 2209)  Labs: Recent Labs    03/12/23 0223 03/13/23 0816 03/14/23 2215 03/14/23 2355 03/15/23 0101  HGB 11.0*  --  14.5 16.3 14.6  HCT 34.6*  --  47.1 48.0 43.0  PLT 205  --  301  --   --   CREATININE 3.11* 3.00* 2.94*  --   --     Estimated Creatinine Clearance: 21.6 mL/min (A) (by C-G formula based on SCr of 2.94 mg/dL (H)).   Medical History: Past Medical History:  Diagnosis Date   Cataract    CHF (congestive heart failure) (HCC)    Chronic heart failure with preserved ejection fraction (HFpEF) (HCC)    Colon polyp 2003   Dr Victorino Dike; F/U was to be 2008( not completed)   Coronary artery disease    CVA (cerebral infarction) 2014   Diabetes mellitus    Diverticulosis 2003   Dyspnea    with exertion   Gout    Hereditary cardiac amyloidosis (HCC)    Hypertension    Myocardial infarction (HCC) 09/19/2012   PEA cardiac arrest in setting of acute respiratory failure/pulmonary edema   OSA (obstructive sleep apnea) 03/27/2018   Pneumonia 09/29/2011   Avelox X 10 days as OP   Prostate cancer (HCC)    Renal insufficiency    Seizures (HCC) 09/19/2012   not treated for seizure disorder; had a seizure after stroke 2014; no seizure since then   Stroke [facility])     Assessment: 77 y/o M presents to the ED with profound hypoglycemia, on apixaban PTA for afib, holding apixaban and using heparin for now, anticipate using aPTT to dose for a few days, above labs reviewed.   Goal of Therapy:  Heparin level 0.3-0.7 units/ml aPTT 66-102 seconds Monitor  platelets by anticoagulation protocol: Yes   Plan:  Start heparin drip at 1000 units/hr Heparin level and aPTT in 8 hours Daily CBC, heparin level, and aPTT Monitor for bleeding  Abran Duke, PharmD, BCPS Clinical Pharmacist Phone: [phone]

## 2023-03-15 NOTE — ED Notes (Signed)
Phlebotomy team stuck patient four times and only able to obtain one set of blood cultures at this time.

## 2023-03-15 NOTE — Progress Notes (Signed)
Pharmacy Antibiotic Note  Corey [name] is a 77 y.o. male admitted on 03/14/2023 with  possible UTI .  Pharmacy has been consulted for Vancomycin dosing. WBC WNL. Noted renal dysfunction.   Plan: Vancomycin 1500 mg IV x 1, then 1250 mg IV q48h >>>Estimated AUC: 539 Ceftriaxone per MD Trend WBC, temp, renal function  F/U infectious work-up Drug levels as indicated   Temp (24hrs), Avg:96.7 F (35.9 C), Min:95.6 F (35.3 C), Max:97.8 F (36.6 C)  Recent Labs  Lab 03/10/23 0216 03/11/23 0216 03/12/23 0223 03/13/23 0816 03/14/23 2215  WBC  --   --  3.8*  --  4.5  CREATININE 2.91* 2.92* 3.11* 3.00* 2.94*    Estimated Creatinine Clearance: 21.6 mL/min (A) (by C-G formula based on SCr of 2.94 mg/dL (H)).    Allergies  Allergen Reactions   Hydrochlorothiazide Other (See Comments)    Gout , uncontrolled diabetes and renal insufficiency    Abran Duke, PharmD, BCPS Clinical Pharmacist Phone: [phone]

## 2023-03-15 NOTE — Progress Notes (Addendum)
PHARMACY - ANTICOAGULATION CONSULT NOTE  Pharmacy Consult for Heparin (Apixaban on hold) Indication: atrial fibrillation  Allergies  Allergen Reactions   Hydrochlorothiazide Other (See Comments)    Gout , uncontrolled diabetes and renal insufficiency    Patient Measurements: 77.2 kg  Vital Signs: Temp: 97.5 F (36.4 C) (10/31 0500) Temp Source: Axillary (10/31 0500) BP: 115/69 (10/31 0645) Pulse Rate: 109 (10/31 0645)  Labs: Recent Labs    03/14/23 2215 03/14/23 2355 03/15/23 0101 03/15/23 0138 03/15/23 0255 03/15/23 0420  HGB 14.5   < > 14.6 10.8* 13.2  --   HCT 47.1   < > 43.0 34.2* 43.7  --   PLT 301  --   --  297 205  --   CREATININE 2.94*  --   --  2.94* 2.75* 2.66*   < > = values in this interval not displayed.    Estimated Creatinine Clearance: 22 mL/min (A) (by C-G formula based on SCr of 2.66 mg/dL (H)).   Medical History: Past Medical History:  Diagnosis Date   Cataract    CHF (congestive heart failure) (HCC)    Chronic heart failure with preserved ejection fraction (HFpEF) (HCC)    Colon polyp 2003   Dr Victorino Dike; F/U was to be 2008( not completed)   Coronary artery disease    CVA (cerebral infarction) 2014   Diabetes mellitus    Diverticulosis 2003   Dyspnea    with exertion   Gout    Hereditary cardiac amyloidosis (HCC)    Hypertension    Myocardial infarction (HCC) 09/19/2012   PEA cardiac arrest in setting of acute respiratory failure/pulmonary edema   OSA (obstructive sleep apnea) 03/27/2018   Pneumonia 09/29/2011   Avelox X 10 days as OP   Prostate cancer (HCC)    Renal insufficiency    Seizures (HCC) 09/19/2012   not treated for seizure disorder; had a seizure after stroke 2014; no seizure since then   Stroke [facility])     Assessment: 77 y/o M presents to the ED with profound hypoglycemia, on apixaban PTA for afib, holding apixaban and using heparin for now, anticipate using aPTT to dose for a few days, above labs reviewed.    Heparin level supratherapeutic at > 1.1. aPTT therapeutic at 71. Heparin level elevation is from Eliquis administration prior to admission (10/30). No issues with line reported. Hematuria noted from traumatic foley insertion.   Goal of Therapy:  Heparin level 0.3-0.7 units/ml aPTT 66-102 seconds Monitor platelets by anticoagulation protocol: Yes   Plan:  Continue heparin drip at 1000 units/hr Confirmatory aPTT in 8 hours Daily CBC, heparin level, and aPTT Monitor for bleeding  Cedric Fishman, PharmD, BCPS, BCCCP Clinical Pharmacist

## 2023-03-15 NOTE — ED Notes (Signed)
ED TO INPATIENT HANDOFF REPORT  ED Nurse Name and Phone #: Rodney Booze [phone]  S Name/Age/Gender Corey [name] 77 y.o. male Room/Bed: 034C/034C  Code Status   Code Status: Full Code  Home/SNF/Other Skilled nursing facility Patient oriented to: self Is this baseline? No   Triage Complete: Triage complete  Chief Complaint Acute metabolic encephalopathy [G93.41]  Triage Note No notes on file   Allergies Allergies  Allergen Reactions   Hydrochlorothiazide Other (See Comments)    Gout , uncontrolled diabetes and renal insufficiency    Level of Care/Admitting Diagnosis ED Disposition     ED Disposition  Admit   Condition  --   Comment  Hospital Area: [facility] [[id number]]  Level of Care: ICU [6]  May admit patient to [facility] or [facility] if equivalent level of care is available:: Yes  Covid Evaluation: Asymptomatic - no recent exposure (last 10 days) testing not required  Diagnosis: Acute metabolic encephalopathy [0981191]  Admitting Physician: Steffanie Dunn [[id number]]  Attending Physician: Steffanie Dunn [[id number]]  Certification:: I certify this patient will need inpatient services for at least 2 midnights  Expected Medical Readiness: 03/20/2023          B Medical/Surgery History Past Medical History:  Diagnosis Date   Cataract    CHF (congestive heart failure) (HCC)    Chronic heart failure with preserved ejection fraction (HFpEF) (HCC)    Colon polyp 2003   Dr Victorino Dike; F/U was to be 2008( not completed)   Coronary artery disease    CVA (cerebral infarction) 2014   Diabetes mellitus    Diverticulosis 2003   Dyspnea    with exertion   Gout    Hereditary cardiac amyloidosis (HCC)    Hypertension    Myocardial infarction (HCC) 09/19/2012   PEA cardiac arrest in setting of acute respiratory failure/pulmonary edema   OSA (obstructive sleep apnea) 03/27/2018   Pneumonia 09/29/2011   Avelox X 10 days as OP   Prostate cancer  (HCC)    Renal insufficiency    Seizures (HCC) 09/19/2012   not treated for seizure disorder; had a seizure after stroke 2014; no seizure since then   Stroke [facility])    Past Surgical History:  Procedure Laterality Date   A-FLUTTER ABLATION N/A 06/23/2022   Procedure: A-FLUTTER ABLATION;  Surgeon: Maurice Small, MD;  Location: [facility] INVASIVE CV LAB;  Service: Cardiovascular;  Laterality: N/A;   A/V FISTULAGRAM Left 04/28/2022   Procedure: A/V Fistulagram;  Surgeon: Chuck Hint, MD;  Location: [facility] INVASIVE CV LAB;  Service: Cardiovascular;  Laterality: Left;   AV FISTULA PLACEMENT Left 01/31/2022   Procedure: LEFT ARM ARTERIOVENOUS (AV) FISTULA CREATION;  Surgeon: Maeola Harman, MD;  Location: [facility] OR;  Service: Vascular;  Laterality: Left;  regional block to left arm   CARDIOVERSION N/A 03/24/2022   Procedure: CARDIOVERSION;  Surgeon: Dorthula Nettles, DO;  Location: [facility] ENDOSCOPY;  Service: Cardiovascular;  Laterality: N/A;   COLONOSCOPY W/ POLYPECTOMY  2003   no F/U (SOC discussed 12/03/12)   INGUINAL HERNIA REPAIR Left 06/16/2021   Procedure: LAPAROSCOPIC, POSSIBLY OPEN LEFT INGUINAL HERNIA REPAIR WITH MESH;  Surgeon: Quentin Ore, MD;  Location: [facility] ORS;  Service: General;  Laterality: Left;   INSERTION OF MESH N/A 07/16/2017   Procedure: INSERTION OF MESH;  Surgeon: Berna Bue, MD;  Location: [facility] OR;  Service: General;  Laterality: N/A;   PACEMAKER IMPLANT N/A 12/28/2022   Procedure: PACEMAKER IMPLANT;  Surgeon:  Mealor, Roberts Gaudy, MD;  Location: [facility] INVASIVE CV LAB;  Service: Cardiovascular;  Laterality: N/A;   PEG PLACEMENT  2014   10/08/12-12/10/12   PEG TUBE REMOVAL  2014   PERIPHERAL VASCULAR BALLOON ANGIOPLASTY  04/28/2022   Procedure: PERIPHERAL VASCULAR BALLOON ANGIOPLASTY;  Surgeon: Chuck Hint, MD;  Location: [facility] INVASIVE CV LAB;  Service: Cardiovascular;;   PROSTATE BIOPSY     RIGHT HEART CATH N/A 03/24/2022   Procedure: RIGHT HEART  CATH;  Surgeon: Dorthula Nettles, DO;  Location: [facility] INVASIVE CV LAB;  Service: Cardiovascular;  Laterality: N/A;   RIGHT/LEFT HEART CATH AND CORONARY ANGIOGRAPHY N/A 09/20/2020   Procedure: RIGHT/LEFT HEART CATH AND CORONARY ANGIOGRAPHY;  Surgeon: Marykay Lex, MD;  Location: [facility] INVASIVE CV LAB;  Service: Cardiovascular;  Laterality: N/A;   SUBQ ICD IMPLANT N/A 09/04/2022   Procedure: SUBQ ICD IMPLANT;  Surgeon: Maurice Small, MD;  [facility]: [facility] INVASIVE CV LAB;  Service: Cardiovascular;  Laterality: N/A;   TEE WITHOUT CARDIOVERSION N/A 09/27/2020   Procedure: TRANSESOPHAGEAL ECHOCARDIOGRAM (TEE);  Surgeon: Linden Dolin, MD;  Location: [facility] OR;  Service: Thoracic;  Laterality: N/A;   TEE WITHOUT CARDIOVERSION N/A 03/24/2022   Procedure: TRANSESOPHAGEAL ECHOCARDIOGRAM (TEE);  Surgeon: Dorthula Nettles, DO;  Location: [facility] ENDOSCOPY;  Service: Cardiovascular;  Laterality: N/A;   TEE WITHOUT CARDIOVERSION N/A 06/23/2022   Procedure: TRANSESOPHAGEAL ECHOCARDIOGRAM (TEE);  Surgeon: Maurice Small, MD;  Location: [facility] INVASIVE CV LAB;  Service: Cardiovascular;  Laterality: N/A;   TRACHEOSTOMY  2014   09/30/12-10/20/12   VENTRAL HERNIA REPAIR N/A 07/16/2017   Procedure: LAPAROSCOPIC VENTRAL HERNIA REPAIR WITH MESH;  Surgeon: Berna Bue, MD;  Location: [facility] OR;  Service: General;  Laterality: N/A;   wrist aspiration  02/16/2012    monosodium urate crystals; Dr Timmie Foerster IV Location/Drains/Wounds Patient Lines/Drains/Airways Status     Active Line/Drains/Airways     Name Placement date Placement time Site Days   Peripheral IV 12/26/22 20 G Posterior;Right Forearm 12/26/22  1257  Forearm  79   Peripheral IV 03/14/23 18 G Anterior;Right External jugular 03/14/23  2149  External jugular  1   Fistula / Graft Left Upper arm Arteriovenous fistula 01/31/22  1019  Upper arm  408   Chest Tube 1 Lateral Mediastinal 28 Fr. 09/27/20  1345  Mediastinal  899   Urethral Catheter London Sheer, RN  Latex 16 Fr. 03/09/23  1030  Latex  6   Incision - 3 Ports Abdomen Left;Upper Umbilicus Right;Lower 06/16/21  0753  -- 637   Wound / Incision (Open or Dehisced) 02/28/23 Venous stasis ulcer Pretibial Right 02/28/23  0254  Pretibial  15   Wound / Incision (Open or Dehisced) 02/28/23 Venous stasis ulcer Pretibial Left;Anterior 02/28/23  0254  Pretibial  15   Wound / Incision (Open or Dehisced) 03/09/23 Irritant Dermatitis (Moisture Associated Skin Damage) Buttocks Left small open area 03/09/23  0800  Buttocks  6            Intake/Output Last 24 hours No intake or output data in the 24 hours ending 03/15/23 0103  Labs/Imaging Results for orders placed or performed during the hospital encounter of 03/14/23 (from the past 48 hour(s))  CBG monitoring, ED     Status: Abnormal   Collection Time: 03/14/23  9:54 PM  Result Value Ref Range   Glucose-Capillary <10 (LL) 70 - 99 mg/dL    Comment: Glucose reference range applies only to samples taken after fasting for  at least 8 hours.  CBG monitoring, ED     Status: Abnormal   Collection Time: 03/14/23 10:05 PM  Result Value Ref Range   Glucose-Capillary 157 (H) 70 - 99 mg/dL    Comment: Glucose reference range applies only to samples taken after fasting for at least 8 hours.  CBC     Status: Abnormal   Collection Time: 03/14/23 10:15 PM  Result Value Ref Range   WBC 4.5 4.0 - 10.5 K/uL   RBC 5.66 4.22 - 5.81 MIL/uL   Hemoglobin 14.5 13.0 - 17.0 g/dL   HCT 08.6 57.8 - 46.9 %   MCV 83.2 80.0 - 100.0 fL   MCH 25.6 (L) 26.0 - 34.0 pg   MCHC 30.8 30.0 - 36.0 g/dL   RDW 62.9 (H) 52.8 - 41.3 %   Platelets 301 150 - 400 K/uL   nRBC 0.0 0.0 - 0.2 %    Comment: Performed at [facility], [address], [place], [place] [address]  Basic metabolic panel     Status: Abnormal   Collection Time: 03/14/23 10:15 PM  Result Value Ref Range   Sodium 135 135 - 145 mmol/L   Potassium 5.5 (H) 3.5 - 5.1 mmol/L    Comment: HEMOLYSIS AT THIS LEVEL MAY  AFFECT RESULT   Chloride 88 (L) 98 - 111 mmol/L   CO2 31 22 - 32 mmol/L   Glucose, Bld 119 (H) 70 - 99 mg/dL    Comment: Glucose reference range applies only to samples taken after fasting for at least 8 hours.   BUN 127 (H) 8 - 23 mg/dL   Creatinine, Ser 0.27 (H) 0.61 - 1.24 mg/dL   Calcium 9.4 8.9 - 25.3 mg/dL   GFR, Estimated 22 (L) >60 mL/min    Comment: (NOTE) Calculated using the CKD-EPI Creatinine Equation (2021)    Anion gap 16 (H) 5 - 15    Comment: Performed at [facility], [address], [place], [place] [address]  Hepatic function panel     Status: Abnormal   Collection Time: 03/14/23 10:15 PM  Result Value Ref Range   Total Protein 7.3 6.5 - 8.1 g/dL   Albumin 2.8 (L) 3.5 - 5.0 g/dL   AST 347 (H) 15 - 41 U/L    Comment: HEMOLYSIS AT THIS LEVEL MAY AFFECT RESULT   ALT 41 0 - 44 U/L    Comment: HEMOLYSIS AT THIS LEVEL MAY AFFECT RESULT   Alkaline Phosphatase 106 38 - 126 U/L   Total Bilirubin 2.4 (H) 0.3 - 1.2 mg/dL    Comment: HEMOLYSIS AT THIS LEVEL MAY AFFECT RESULT   Bilirubin, Direct 1.3 (H) 0.0 - 0.2 mg/dL   Indirect Bilirubin 1.1 (H) 0.3 - 0.9 mg/dL    Comment: Performed at [facility], [address] [place], [place] [address]  Ethanol     Status: None   Collection Time: 03/14/23 10:15 PM  Result Value Ref Range   Alcohol, Ethyl (B) <10 <10 mg/dL    Comment: (NOTE) Lowest detectable limit for serum alcohol is 10 mg/dL.  For medical purposes only. Performed at [facility], [address] [place], [place] [address]   CBG monitoring, ED     Status: None   Collection Time: 03/14/23 10:24 PM  Result Value Ref Range   Glucose-Capillary 74 70 - 99 mg/dL    Comment: Glucose reference range applies only to samples taken after fasting for at least 8 hours.  CBG monitoring, ED     Status: None   Collection Time: 03/14/23 10:49 PM  Result Value Ref Range   Glucose-Capillary 90 70 - 99 mg/dL    Comment: Glucose reference range applies  only to samples taken after fasting for at least 8 hours.  CBG monitoring, ED     Status: Abnormal   Collection Time: 03/14/23 11:31 PM  Result Value Ref Range   Glucose-Capillary <10 (LL) 70 - 99 mg/dL    Comment: Glucose reference range applies only to samples taken after fasting for at least 8 hours.  CBG monitoring, ED     Status: Abnormal   Collection Time: 03/14/23 11:41 PM  Result Value Ref Range   Glucose-Capillary <10 (LL) 70 - 99 mg/dL    Comment: Glucose reference range applies only to samples taken after fasting for at least 8 hours.  POC CBG, ED     Status: Abnormal   Collection Time: 03/14/23 11:46 PM  Result Value Ref Range   Glucose-Capillary 343 (H) 70 - 99 mg/dL    Comment: Glucose reference range applies only to samples taken after fasting for at least 8 hours.  Brain natriuretic peptide     Status: Abnormal   Collection Time: 03/14/23 11:54 PM  Result Value Ref Range   B Natriuretic Peptide >4,500.0 (H) 0.0 - 100.0 pg/mL    Comment: Performed at [facility], [address], [place], [place] [address]  I-Stat venous blood gas, [facility] ED, [facility], [facility])     Status: Abnormal   Collection Time: 03/14/23 11:55 PM  Result Value Ref Range   pH, Ven 7.333 7.25 - 7.43   pCO2, Ven 75.6 (HH) 44 - 60 mmHg   pO2, Ven 19 (LL) 32 - 45 mmHg   Bicarbonate 40.1 (H) 20.0 - 28.0 mmol/L   TCO2 42 (H) 22 - 32 mmol/L   O2 Saturation 23 %   Acid-Base Excess 10.0 (H) 0.0 - 2.0 mmol/L   Sodium 127 (L) 135 - 145 mmol/L   Potassium >8.5 (HH) 3.5 - 5.1 mmol/L   Calcium, Ion 0.94 (L) 1.15 - 1.40 mmol/L   HCT 48.0 39.0 - 52.0 %   Hemoglobin 16.3 13.0 - 17.0 g/dL   Sample type VENOUS    Comment NOTIFIED PHYSICIAN   CBG monitoring, ED     Status: Abnormal   Collection Time: 03/15/23 12:04 AM  Result Value Ref Range   Glucose-Capillary 290 (H) 70 - 99 mg/dL    Comment: Glucose reference range applies only to samples taken after fasting for at least 8 hours.  POC CBG, ED     Status:  Abnormal   Collection Time: 03/15/23 12:39 AM  Result Value Ref Range   Glucose-Capillary 299 (H) 70 - 99 mg/dL    Comment: Glucose reference range applies only to samples taken after fasting for at least 8 hours.   *Note: Due to a large number of results and/or encounters for the requested time period, some results have not been displayed. A complete set of results can be found in Results Review.   CT Head Wo Contrast  Result Date: 03/15/2023 CLINICAL DATA:  Altered mental status EXAM: CT HEAD WITHOUT CONTRAST TECHNIQUE: Contiguous axial images were obtained from the base of the skull through the vertex without intravenous contrast. RADIATION DOSE REDUCTION: This exam was performed according to the departmental dose-optimization program which includes automated exposure control, adjustment of the mA and/or kV according to patient size and/or use of iterative  reconstruction technique. COMPARISON:  CT brain 03/23/2022, MRI 01/07/2015 FINDINGS: Brain: No acute territorial infarction, hemorrhage or focal intracranial mass. Small chronic left cerebellar infarct. Mild chronic small vessel ischemic changes of the white matter. Nonenlarged ventricles Vascular: No hyperdense vessels.  Carotid vascular calcification. Skull: No fracture. Extensive dural and tentorial calcifications as noted on prior exams potentially due to metabolic disease. Sinuses/Orbits: No acute finding. Other: Moderate gas incompletely visualized within the right greater than left masticator space. IMPRESSION: 1. No CT evidence for acute intracranial abnormality. 2. Mild chronic small vessel ischemic changes of the white matter. Small chronic left cerebellar infarct. 3. Moderate gas incompletely visualized within the right greater than left masticator space, this is of uncertain source and significance, correlate for any signs or symptoms of infection. Electronically Signed   By: Jasmine Pang M.D.   On: 03/15/2023 00:25   DG Chest Portable  1 View  Result Date: 03/14/2023 CLINICAL DATA:  Shortness of breath EXAM: PORTABLE CHEST 1 VIEW COMPARISON:  02/27/2023 FINDINGS: Cardiomegaly. Bilateral chest wall pacing devices. Pulmonary vascular congestion. Low lung volumes with basilar atelectasis and accentuation of the pulmonary vascularity. Question small bilateral pleural effusions. No pneumothorax. IMPRESSION: Exam is compromised by low lung volumes. Cardiomegaly with pulmonary vascular congestion and possible small bilateral pleural effusions. Electronically Signed   By: Minerva Fester M.D.   On: 03/14/2023 23:38    Pending Labs Unresulted Labs (From admission, onward)     Start     Ordered   03/15/23 0500  CBC  Tomorrow morning,   R        03/15/23 0024   03/15/23 0500  Basic metabolic panel  Tomorrow morning,   R        03/15/23 0024   03/15/23 0500  Magnesium  Tomorrow morning,   R        03/15/23 0024   03/15/23 0500  Phosphorus  Tomorrow morning,   R        03/15/23 0024   03/15/23 0058  Blood gas, arterial  Once,   R        03/15/23 0057   03/15/23 0050  Ammonia  Once,   R        03/15/23 0050   03/15/23 0043  Lactic acid, plasma  (Lactic Acid)  STAT Now then every 3 hours,   R (with STAT occurrences)      03/15/23 0042   03/15/23 0024  CBC  (heparin)  Once,   R       Comments: Baseline for heparin therapy IF NOT ALREADY DRAWN.  Notify MD if PLT < 100 K.    03/15/23 0024   03/15/23 0023  Remove and replace urinary cath (placed > 5 days) then obtain urine culture from new indwelling urinary catheter.  (Urine Culture)  Once,   URGENT       Question:  Indication  Answer:  Acute gross hematuria   03/15/23 0023   03/15/23 0021  Brain natriuretic peptide  Once,   URGENT        03/15/23 0023   03/15/23 0021  Urinalysis, Routine w reflex microscopic -Urine, Catheterized; Indwelling urinary catheter  Once,   URGENT       Comments: From new foley   Question Answer Comment  Specimen Source Urine, Catheterized   Specimen  Source Indwelling urinary catheter      03/15/23 0023   03/14/23 2358  Basic metabolic panel  ONCE - STAT,   STAT  03/14/23 2357   03/14/23 2357  Cortisol  ONCE - STAT,   URGENT        03/14/23 2357   03/14/23 2357  TSH  ONCE - URGENT,   URGENT        03/14/23 2357   03/14/23 2357  T4, free  ONCE - URGENT,   URGENT        03/14/23 2357   03/14/23 2311  Blood culture (routine x 2)  BLOOD CULTURE X 2,   R (with STAT occurrences)      03/14/23 2310            Vitals/Pain Today's Vitals   03/14/23 2231 03/14/23 2235 03/14/23 2246 03/15/23 0045  BP:  (!) 122/92  (!) 142/122  Pulse:      Resp:  (!) 21  13  Temp: 97.8 F (36.6 C)  (!) 95.6 F (35.3 C)   TempSrc: Axillary  Rectal   SpO2:        Isolation Precautions No active isolations  Medications Medications  glucagon (human recombinant) (GLUCAGEN) injection 1 mg (has no administration in time range)  ceFEPIme (MAXIPIME) 2 g in sodium chloride 0.9 % 100 mL IVPB (2 g Intravenous New Bag/Given 03/15/23 0036)  metroNIDAZOLE (FLAGYL) IVPB 500 mg (500 mg Intravenous New Bag/Given 03/15/23 0030)  vancomycin (VANCOREADY) IVPB 1500 mg/300 mL (has no administration in time range)  docusate sodium (COLACE) capsule 100 mg (has no administration in time range)  polyethylene glycol (MIRALAX / GLYCOLAX) packet 17 g (has no administration in time range)  acetaminophen (TYLENOL) tablet 650 mg (has no administration in time range)  ondansetron (ZOFRAN) injection 4 mg (has no administration in time range)  sodium zirconium cyclosilicate (LOKELMA) packet 10 g (has no administration in time range)  cefTRIAXone (ROCEPHIN) 2 g in sodium chloride 0.9 % 100 mL IVPB (has no administration in time range)  midodrine (PROAMATINE) tablet 5 mg (has no administration in time range)  calcium gluconate 2 g/ 100 mL sodium chloride IVPB (has no administration in time range)  dextrose 50 % solution (100 mLs  Given 03/14/23 2209)  albuterol  (PROVENTIL) (2.5 MG/3ML) 0.083% nebulizer solution 10 mg (10 mg Nebulization Given 03/15/23 0007)  furosemide (LASIX) injection 40 mg (40 mg Intravenous Given 03/15/23 0025)  dextrose 50 % solution (50 mLs  Given 03/15/23 0008)    Mobility walks     Focused Assessments Cardiac Assessment Handoff:    Lab Results  Component Value Date   CKTOTAL 74 10/07/2012   TROPONINI <0.30 09/19/2012   Lab Results  Component Value Date   DDIMER 13.86 (H) 02/27/2023   Does the Patient currently have chest pain? No    R Recommendations: See Admitting Provider Note  Report given to:   Additional Notes:

## 2023-03-15 NOTE — Progress Notes (Signed)
eLink Physician-Brief Progress Note Patient Name: Corey [name] DOB: 24-Jun-1945 MRN: [id number]   Date of Service  03/15/2023  HPI/Events of Note  Patient admitted with hypothermia, altered mental status and equivocal hypoglycemia occurring against the backdrop of suspected sepsis of urinary tract origin, work up is in progress, and patient is on empiric antibiotics.  eICU Interventions  New Patient Evaluation.        Corey [name] 03/15/2023, 2:15 AM

## 2023-03-15 NOTE — Progress Notes (Signed)
Pt arrived from the ER with blue shorts on. Items placed in personal belonging bag. No family at bedside currently.

## 2023-03-15 NOTE — H&P (Addendum)
NAME:  ALROY BERNHARD, MRN:  [id number], DOB:  [birth year], LOS: 0 ADMISSION DATE:  03/02/2023, CONSULTATION DATE:  10/31 REFERRING MD:  Nicanor Alcon, CHIEF COMPLAINT:  hypoglycemia   History of Present Illness:  [name] is a 77 year old gentleman with a history of HFrEF, urinary retention with recent admission from 02/27/2023-03/13/2023 for decompensated heart failure.  He was discharged with Foley catheter for urinary retention.  Today at his rehab facility he was more confused than normal with low blood sugars from his fingers.  He was brought back to the hospital today.  He has had a normal blood sugar on his labs but low blood sugars from his fingers.  No blood sugars have been obtained from other sites since.  He denies pain.  Family reports that his cough is baseline and he routinely has an atypical breathing pattern where he breathes fast and slow.  Hematuria is new today.  In the emergency department he was noted to have dropping temperature and due to concern for sepsis was started on empiric IV antibiotics.  Pertinent  Medical History  DM PPM CKD IV Chronic HFrEF, cardiac amyloidosis OSA H/o CVA HTN   Significant Hospital Events: Including procedures, antibiotic start and stop dates in addition to other pertinent events   10/30 brought to ED, admitted for hypoglycemia, hypothermia, concern for sepsis versus decompensated heart failure  Interim History / Subjective:    Objective   Blood pressure (!) 122/92, pulse 60, temperature (!) 95.6 F (35.3 C), temperature source Rectal, resp. rate (!) 21, SpO2 98%.       No intake or output data in the 24 hours ending 03/15/23 0025 There were no vitals filed for this visit.  Examination: General: ill-appearing man sitting up in bed in no acute distress HENT: [place]/AT, eyes anicteric Lungs: Breathing comfortably on nasal cannula, Cheyne-Stokes breathing pattern.  Decreased basilar breath sounds.  No wheezing. Cardiovascular: S1-S2,  regular rate and rhythm, no murmurs.  Paced rhythm on telemetry. Abdomen: Soft, nontender Extremities: Pitting edema, some stasis dermatitis changes on both shins Neuro: Confused, answer some questions, globally weak.  At times staring off in space. GU: Foley with bloody urine  Labs reviewed. Potassium 5.5 Bicarb 31 BUN 127 Creatinine 2.94 AST 103 ALT 41 T. bili 2.4 WBC 4.5 H/H14.5/47.1 Platelets 301 CT head personally reviewed> no acute bleeding, +atrophy CXR personally reviewed> low lung volumes, dependent effusions. Central edema.   Resolved Hospital Problem list     Assessment & Plan:  Hypoglycemia-- has only been present from finger sticks-- his earlobe BG was ~300 while I was in the room and his admission BMP was BG 119. Suspect this is more related to perfusion than anything else. -recheck ear BG in 30 min -repeat BMP now -hold insulin -if he has confirmed hypoglycemia on lab or from ear lobe, can start steroids, octreotide   Acute encephalopathy- potentially multifactorial- concern for UTI, azotemia from CKD.  -Checking ABG to assess oxygenation; discussed with RT that he has cheyne stokes respirations and he likely is not hypercapnic nor needing BiPAP currently. -hold potentially sedating meds -change foley, culture from fresh foley, empiric antibiotics due to concern for UTI  Possible UTI -empiric ceftriaxone, vanc; prefer to avoid cefepime with encephalopathy -Blood cultures being collected  Hypothermia -check TSH, free T4, cortisol -reviewed last admission-- only received 3 days of steroids while admitted and 2 days post-discharge, so unlikely adrenal insufficiency triggered by this  Acute on chronic HFrEF; not likely volume down since discharge with  increased Hb since a few days ago Cardiac amyloidosis -hold on fluids and diuretics for now -check LA, BNP level -worry he may need inotropes; hold PTA Bblocker -PTA Tafamadis not on formulary; family could  bring from home if he is taking this as prescribed  Chronic hypotension -con't PTA midodrine  History of Afib -hold eliquis; switch to heparin due to likely need for procedures  Hyperkalemia Urinary retention CKD IV with azotemia; per recent nephrology notes is likely to require HD soon. Renal function stable from discharge on 10/29.  -change foley -maintain adequate perfusion -renal diet -lokelma, empiric 2g Ca+. EKG not loaded; so unsure at this time if EKG changes, but none obvious on tele. -nephrology consult tomorrow; they had anticipated he would be requiring HD soon (last seen 10/25)   Wife and son updated at bedside in the ED. Full code. Long-term prognosis guarded with repeat admissions, deconditioning, advanced heart failure, advanced kidney disease.   Best Practice (right click and "Reselect all SmartList Selections" daily)   Diet/type: Regular consistency (see orders) DVT prophylaxis: systemic heparin GI prophylaxis: N/A Lines: N/A Foley:  Yes, and it is still needed> foley change & culture ordered Code Status:  full code Last date of multidisciplinary goals of care discussion [wife and son updated in ED]  Labs   CBC: Recent Labs  Lab 03/12/23 0223 04-13-23 2215 13-Apr-2023 2355  WBC 3.8* 4.5  --   HGB 11.0* 14.5 16.3  HCT 34.6* 47.1 48.0  MCV 81.2 83.2  --   PLT 205 301  --     Basic Metabolic Panel: Recent Labs  Lab 03/09/23 0219 03/10/23 0216 03/11/23 0216 03/12/23 0223 03/13/23 0816 April 13, 2023 2215 April 13, 2023 2355  NA 138 137 134* 131* 135 135 127*  K 3.7 3.6 3.9 4.0 4.0 5.5* >8.5*  CL 90* 88* 88* 86* 88* 88*  --   CO2 36* 37* 33* 33* 36* 31  --   GLUCOSE 114* 121* 212* 184* 143* 119*  --   BUN 92* 93* 96* 110* 123* 127*  --   CREATININE 2.98* 2.91* 2.92* 3.11* 3.00* 2.94*  --   CALCIUM 9.0 9.0 8.9 9.1 9.4 9.4  --   MG 2.7*  --   --   --   --   --   --    GFR: Estimated Creatinine Clearance: 21.6 mL/min (A) (by C-G formula based on SCr of 2.94  mg/dL (H)). Recent Labs  Lab 03/12/23 0223 April 13, 2023 2215  WBC 3.8* 4.5    Liver Function Tests: Recent Labs  Lab Apr 13, 2023 2215  AST 103*  ALT 41  ALKPHOS 106  BILITOT 2.4*  PROT 7.3  ALBUMIN 2.8*   No results for input(s): "LIPASE", "AMYLASE" in the last 168 hours. No results for input(s): "AMMONIA" in the last 168 hours.  ABG    Component Value Date/Time   PHART 7.341 (L) 09/20/2020 1337   PCO2ART 37.9 09/20/2020 1337   PO2ART 96 09/20/2020 1337   HCO3 40.1 (H) 2023-04-13 2355   TCO2 42 (H) 04-13-23 2355   ACIDBASEDEF 4.0 (H) 03/24/2022 1428   ACIDBASEDEF 3.0 (H) 03/24/2022 1428   O2SAT 23 Apr 13, 2023 2355     Coagulation Profile: No results for input(s): "INR", "PROTIME" in the last 168 hours.  Cardiac Enzymes: No results for input(s): "CKTOTAL", "CKMB", "CKMBINDEX", "TROPONINI" in the last 168 hours.  HbA1C: Hgb A1c MFr Bld  Date/Time Value Ref Range Status  02/13/2023 08:45 AM 6.1 4.6 - 6.5 % Final  Comment:    Glycemic Control Guidelines for People with Diabetes:Non Diabetic:  <6%Goal of Therapy: <7%Additional Action Suggested:  >8%   09/19/2022 10:03 AM 7.0 (H) 4.6 - 6.5 % Final    Comment:    Glycemic Control Guidelines for People with Diabetes:Non Diabetic:  <6%Goal of Therapy: <7%Additional Action Suggested:  >8%     CBG: Recent Labs  Lab 02/25/2023 2249 02/27/2023 2331 02/20/2023 2341 03/12/2023 2346 03/15/23 0004  GLUCAP 90 <10* <10* 343* 290*    Review of Systems:   Limited by encephalopathy.  Past Medical History:  He,  has a past medical history of Cataract, CHF (congestive heart failure) (HCC), Chronic heart failure with preserved ejection fraction (HFpEF) (HCC), Colon polyp (2003), Coronary artery disease, CVA (cerebral infarction) (2014), Diabetes mellitus, Diverticulosis (2003), Dyspnea, Gout, Hereditary cardiac amyloidosis (HCC), Hypertension, Myocardial infarction (HCC) (09/19/2012), OSA (obstructive sleep apnea) (03/27/2018),  Pneumonia (09/29/2011), Prostate cancer (HCC), Renal insufficiency, Seizures (HCC) (09/19/2012), and Stroke (HCC).   Surgical History:   Past Surgical History:  Procedure Laterality Date   A-FLUTTER ABLATION N/A 06/23/2022   Procedure: A-FLUTTER ABLATION;  Surgeon: Mealor, Roberts Gaudy, MD;  [facility]: [facility] IN[facility]AS[facility] LAB;  Service: Cardiovascular;  Laterality: N/A;   A/V FISTULAGRAM Left 04/28/2022   Procedure: A/V Fistulagram;  Surgeon: Chuck Hint, MD;  Location[facility] INVASIVE CV LAB;  Service: Cardiovascular;  Laterality: Left;   AV FISTULA PLACEMENT Left 01/31/2022   Procedure: LEFT ARM ARTERIOVENOUS (AV) FISTULA CREATION;  Surgeon: Maeola Harman, MD;  Location: [facility] OR;  Service: Vascular;  Laterality: Left;  regional block to left arm   CARDIOVERSION N/A 03/24/2022   Procedure: CARDIOVERSION;  Surgeon: Dorthula Nettles, DO;  [facility]: [facility];  Service: Cardiovascular;  Laterality: N/A;   COLONOSCOPY W/ POLYPECTOMY  2003   no F/U (SOC discussed 12/03/12)   INGUINAL HERNIA REPAIR Left 06/16/2021   Procedure: LAPAROSCOPIC, POSSIBLY OPEN LEFT INGUINAL HERNIA REPAIR WITH MESH;  Surgeon: Quentin Ore, MD;  Location: [facility] ORS;  Service: General;  Laterality: Left;   INSERTION OF MESH N/A 07/16/2017   Procedure: INSERTION OF MESH;  Surgeon: Berna Bue, MD;  Location: [facility] OR;  Service: General;  Laterality: N/A;   PACEMAKER IMPLANT N/A 12/28/2022   Procedure: PACEMAKER IMPLANT;  Surgeon: Maurice Small, MD;  [facility]: [facility] INV[facility] CV LAB;  Service: Cardiovascular;  Laterality: N/A;   PEG PLACEMENT  2014   10/08/12-12/10/12   PEG TUBE REMOVAL  2014   PERIPHERAL VASCULAR BALLOON ANGIOPLASTY  04/28/2022   Procedure: PERIPHERAL VASCULAR BALLOON ANGIOPLASTY;  Surgeon: Chuck Hint, MD;  Location[facility] INVASIVE CV LAB;  Service: Cardiovascular;;   PROSTATE BIOPSY     RIGHT HEART CATH N/A 03/24/2022   Procedure: RIGHT HEART CATH;  Surgeon: Dorthula Nettles,  DO;  [facility]: [facility];  Service: Cardiovascular;  Laterality: N/A;   RIGHT/LEFT HEART CATH AND CORONARY ANGIOGRAPHY N/A 09/20/2020   Procedure: RIGHT/LEFT HEART CATH AND CORONARY ANGIOGRAPHY;  Surgeon: Marykay Lex, MD;  Location: [facility] INVASIVE CV LAB;  Service: Cardiovascular;  Laterality: N/A;   SUBQ ICD IMPLANT N/A 09/04/2022   Procedure: SUBQ ICD IMPLANT;  Surgeon: Maurice Small, MD;  [facility];  Service: Cardiovascular;  Laterality: N/A;   TEE WITHOUT CARDIOVERSION N/A 09/27/2020   Procedure: TRANSESOPHAGEAL ECHOCARDIOGRAM (TEE);  Surgeon: Linden Dolin, MD;  Location: [facility] OR;  Service: Thoracic;  Laterality: N/A;   TEE WITHOUT CARDIOVERSION N/A 03/24/2022   Procedure: TRANSESOPHAGEAL ECHOCARDIOGRAM (TEE);  Surgeon: Dorthula Nettles, DO;  [facility];  Service: Cardiovascular;  Laterality: N/A;   TEE WITHOUT CARDIOVERSION N/A 06/23/2022   Procedure: TRANSESOPHAGEAL ECHOCARDIOGRAM (TEE);  Surgeon: Maurice Small, MD;  Location: [facility] INVASIVE CV LAB;  Service: Cardiovascular;  Laterality: N/A;   TRACHEOSTOMY  2014   09/30/12-10/20/12   VENTRAL HERNIA REPAIR N/A 07/16/2017   Procedure: LAPAROSCOPIC VENTRAL HERNIA REPAIR WITH MESH;  Surgeon: Berna Bue, MD;  Location: [facility] OR;  Service: General;  Laterality: N/A;   wrist aspiration  02/16/2012    monosodium urate crystals; Dr Melvyn Novas     Social History:   reports that he quit smoking about 54 years ago. His smoking use included cigarettes. He started smoking about 55 years ago. He has a 0.3 pack-year smoking history. He has never used smokeless tobacco. He reports that he does not drink alcohol and does not use drugs.   Family History:  His family history includes Cancer in his father; Diabetes in his father and mother; Heart failure in his brother and mother; Hypertension in his father and mother; Prostate cancer in his father. There is no history of Stroke, Heart disease, or Colon  cancer.   Allergies Allergies  Allergen Reactions   Hydrochlorothiazide Other (See Comments)    Gout , uncontrolled diabetes and renal insufficiency     Home Medications  Prior to Admission medications   Medication Sig Start Date End Date Taking? Authorizing Provider  acetaminophen (TYLENOL) 500 MG tablet Take 1,000 mg by mouth every 8 (eight) hours as needed for moderate pain.    [provider]  albuterol (VENTOLIN HFA) 108 (90 Base) MCG/ACT inhaler Inhale 2 puffs into the lungs every 4 (four) hours as needed for wheezing or shortness of breath. 11/14/21   Valinda Hoar, NP  allopurinol (ZYLOPRIM) 100 MG tablet Take 1/2 (one-half) tablet by mouth once daily 01/18/23   Pincus Sanes, MD  apixaban (ELIQUIS) 5 MG TABS tablet Take 1 tablet by mouth twice daily 01/29/23   Mealor, Roberts Gaudy, MD  bismuth subsalicylate (PEPTO BISMOL) 262 MG/15ML suspension Take 30 mLs by mouth every 6 (six) hours as needed for indigestion or diarrhea or loose stools.    [provider]  cholecalciferol (VITAMIN D3) 25 MCG (1000 UNIT) tablet Take 1,000 Units by mouth daily.    [provider]  glucose blood (ACCU-CHEK AVIVA PLUS) test strip Use to check blood glucose 4 times a day. 12/25/22   Reather Littler, MD  insulin glargine-yfgn (SEMGLEE) 100 UNIT/ML injection Inject 0.1 mLs (10 Units total) into the skin daily. 02/21/2023   Zannie Cove, MD  midodrine (PROAMATINE) 5 MG tablet Take 1 tablet (5 mg total) by mouth 2 (two) times daily with a meal. 03/13/23   Zannie Cove, MD  polyethylene glycol (MIRALAX / GLYCOLAX) 17 g packet Take 17 g by mouth daily as needed. 03/13/23   Zannie Cove, MD  predniSONE (DELTASONE) 20 MG tablet Take 1 tablet (20 mg total) by mouth daily with breakfast for 2 days. 02/26/2023 03/16/23  Zannie Cove, MD  propranolol ER (INDERAL LA) 60 MG 24 hr capsule Take 1 capsule (60 mg total) by mouth daily. 03/12/2023   Zannie Cove, MD  rosuvastatin (CRESTOR) 5 MG  tablet Take 1 tablet (5 mg total) by mouth daily. 06/26/22   Mealor, Roberts Gaudy, MD  sennosides-docusate sodium (SENOKOT-S) 8.6-50 MG tablet Take 2 tablets by mouth daily as needed for constipation.    [provider]  sodium bicarbonate 650  MG tablet TAKE 1 TABLET BY MOUTH TWICE DAILY . APPOINTMENT REQUIRED FOR FUTURE REFILLS 01/18/23   Pincus Sanes, MD  Tafamidis [facility]) 61 MG CAPS Take 1 capsule (61 mg total) by mouth daily. 06/19/22   Jake Bathe, MD  torsemide (DEMADEX) 20 MG tablet Take 2 tablets (40 mg total) by mouth daily. 03/13/23   Zannie Cove, MD  TRUEplus Lancets 33G MISC USE 4 (FOUR) TIMES DAILY 04/04/21   Reather Littler, MD     Critical care time: 45 min    Steffanie Dunn, DO 03/15/23 12:54 AM [facility]  For contact information, see Amion. If no response to pager, please call PCCM consult pager. After hours, 7PM- 7AM, please call Elink.

## 2023-03-15 NOTE — ED Notes (Signed)
PT's old foley was removed and new one was put in.

## 2023-03-15 NOTE — Evaluation (Signed)
Clinical/Bedside Swallow Evaluation Patient Details  Name: Corey [name] MRN: [id number] Date of Birth: 1945-07-16  Today's Date: 03/15/2023 Time: SLP Start Time (ACUTE ONLY): 1140 SLP Stop Time (ACUTE ONLY): 1150 SLP Time Calculation (min) (ACUTE ONLY): 10 min  Past Medical History:  Past Medical History:  Diagnosis Date   Cataract    CHF (congestive heart failure) (HCC)    Chronic heart failure with preserved ejection fraction (HFpEF) (HCC)    Colon polyp 2003   Dr Victorino Dike; F/U was to be 2008( not completed)   Coronary artery disease    CVA (cerebral infarction) 2014   Diabetes mellitus    Diverticulosis 2003   Dyspnea    with exertion   Gout    Hereditary cardiac amyloidosis (HCC)    Hypertension    Myocardial infarction (HCC) 09/19/2012   PEA cardiac arrest in setting of acute respiratory failure/pulmonary edema   OSA (obstructive sleep apnea) 03/27/2018   Pneumonia 09/29/2011   Avelox X 10 days as OP   Prostate cancer (HCC)    Renal insufficiency    Seizures (HCC) 09/19/2012   not treated for seizure disorder; had a seizure after stroke 2014; no seizure since then   Stroke [facility])    Past Surgical History:  Past Surgical History:  Procedure Laterality Date   A-FLUTTER ABLATION N/A 06/23/2022   Procedure: A-FLUTTER ABLATION;  Surgeon: Maurice Small, MD;  [facility]: [facility] INVASIVE CV LAB;  Service: Cardiovascular;  Laterality: N/A;   A/V FISTULAGRAM Left 04/28/2022   Procedure: A/V Fistulagram;  Surgeon: Chuck Hint, MD;  Location: [facility] INVASIVE CV LAB;  Service: Cardiovascular;  Laterality: Left;   AV FISTULA PLACEMENT Left 01/31/2022   Procedure: LEFT ARM ARTERIOVENOUS (AV) FISTULA CREATION;  Surgeon: Maeola Harman, MD;  Location: [facility] OR;  Service: Vascular;  Laterality: Left;  regional block to left arm   CARDIOVERSION N/A 03/24/2022   Procedure: CARDIOVERSION;  Surgeon: Dorthula Nettles, DO;  [facility];  Service: Cardiovascular;   Laterality: N/A;   COLONOSCOPY W/ POLYPECTOMY  2003   no F/U (SOC discussed 12/03/12)   INGUINAL HERNIA REPAIR Left 06/16/2021   Procedure: LAPAROSCOPIC, POSSIBLY OPEN LEFT INGUINAL HERNIA REPAIR WITH MESH;  Surgeon: Quentin Ore, MD;  Location: [facility] ORS;  Service: General;  Laterality: Left;   INSERTION OF MESH N/A 07/16/2017   Procedure: INSERTION OF MESH;  Surgeon: Berna Bue, MD;  Location: [facility] OR;  Service: General;  Laterality: N/A;   PACEMAKER IMPLANT N/A 12/28/2022   Procedure: PACEMAKER IMPLANT;  Surgeon: Maurice Small, MD;  [facility] CV LAB;  Service: Cardiovascular;  Laterality: N/A;   PEG PLACEMENT  2014   10/08/12-12/10/12   PEG TUBE REMOVAL  2014   PERIPHERAL VASCULAR BALLOON ANGIOPLASTY  04/28/2022   Procedure: PERIPHERAL VASCULAR BALLOON ANGIOPLASTY;  Surgeon: Chuck Hint, MD;  Location: [facility] INVASIVE CV LAB;  Service: Cardiovascular;;   PROSTATE BIOPSY     RIGHT HEART CATH N/A 03/24/2022   Procedure: RIGHT HEART CATH;  Surgeon: Dorthula Nettles, DO;  [facility] LAB;  Service: Cardiovascular;  Laterality: N/A;   RIGHT/LEFT HEART CATH AND CORONARY ANGIOGRAPHY N/A 09/20/2020   Procedure: RIGHT/LEFT HEART CATH AND CORONARY ANGIOGRAPHY;  Surgeon: Marykay Lex, MD;  Location: [facility] INVASIVE CV LAB;  Service: Cardiovascular;  Laterality: N/A;   SUBQ ICD IMPLANT N/A 09/04/2022   Procedure: SUBQ ICD IMPLANT;  Surgeon: Maurice Small, MD;  [facility]VAS[facility] CV LAB;  Service: Cardiovascular;  Laterality: N/A;   TEE WITHOUT CARDIOVERSION N/A 09/27/2020   Procedure: TRANSESOPHAGEAL ECHOCARDIOGRAM (TEE);  Surgeon: Linden Dolin, MD;  Location: [facility] OR;  Service: Thoracic;  Laterality: N/A;   TEE WITHOUT CARDIOVERSION N/A 03/24/2022   Procedure: TRANSESOPHAGEAL ECHOCARDIOGRAM (TEE);  Surgeon: Dorthula Nettles, DO;  [facility];  Service: Cardiovascular;  Laterality: N/A;   TEE WITHOUT CARDIOVERSION N/A 06/23/2022    Procedure: TRANSESOPHAGEAL ECHOCARDIOGRAM (TEE);  Surgeon: Maurice Small, MD;  Location: [facility] INVASIVE CV LAB;  Service: Cardiovascular;  Laterality: N/A;   TRACHEOSTOMY  2014   09/30/12-10/20/12   VENTRAL HERNIA REPAIR N/A 07/16/2017   Procedure: LAPAROSCOPIC VENTRAL HERNIA REPAIR WITH MESH;  Surgeon: Berna Bue, MD;  Location: [facility] OR;  Service: General;  Laterality: N/A;   wrist aspiration  02/16/2012    monosodium urate crystals; Dr Melvyn Novas   HPI:  Pt is a 77 year old male, resides at a facility where he was more confused than normal with low blood sugars from his fingers.  He was brought to the hospital 2023-03-18, admitted for hypoglycemia, hypothermia, concern for sepsis versus decompensated heart failure. Remote history of trach/PEG. No sufficient notes from SLP with history.    Assessment / Plan / Recommendation  Clinical Impression  Pt demonstrates no acute swallowing impairment. Passed 3 oz water swallow. Able to masticate graham crackers, but does reports he favors soft food. Recommend regular texture with pt making choices about foods that are appropriate for him. No SLP f/u needed will sign off SLP Visit Diagnosis: Dysphagia, oropharyngeal phase (R13.12)    Aspiration Risk       Diet Recommendation Regular;Thin liquid    Liquid Administration via: Cup;Straw Medication Administration: Whole meds with liquid Supervision: Patient able to self feed Compensations: Slow rate;Small sips/bites Postural Changes: Seated upright at 90 degrees    Other  Recommendations Oral Care Recommendations: Oral care BID    Recommendations for follow up therapy are one component of a multi-disciplinary discharge planning process, led by the attending physician.  Recommendations may be updated based on patient status, additional functional criteria and insurance authorization.  Follow up Recommendations No SLP follow up      Assistance Recommended at Discharge    Functional Status Assessment     Frequency and Duration            Prognosis        Swallow Study   General HPI: Pt is a 77 year old male, resides at a facility where he was more confused than normal with low blood sugars from his fingers.  He was brought to the hospital 18-Mar-2023, admitted for hypoglycemia, hypothermia, concern for sepsis versus decompensated heart failure. Remote history of trach/PEG. No sufficient notes from SLP with history. Type of Study: Bedside Swallow Evaluation Previous Swallow Assessment: see HPI Diet Prior to this Study: NPO Temperature Spikes Noted: No History of Recent Intubation: No Behavior/Cognition: Alert Oral Cavity Assessment: Within Functional Limits Oral Care Completed by SLP: No Oral Cavity - Dentition: Poor condition Vision: Functional for self-feeding Self-Feeding Abilities: Needs assist Patient Positioning: Upright in bed Baseline Vocal Quality: Normal Volitional Cough: Strong Volitional Swallow: Able to elicit    Oral/Motor/Sensory Function Overall Oral Motor/Sensory Function: Within functional limits   Ice Chips     Thin Liquid Thin Liquid: Within functional limits Presentation: Straw    Nectar Thick Nectar Thick Liquid: Not tested   Honey Thick Honey Thick Liquid: Not tested   Puree Puree: Within functional limits   Solid  Solid: Within functional limits      Shaneese Tait, Riley Nearing 03/15/2023,2:08 PM

## 2023-03-15 NOTE — Progress Notes (Signed)
NAME:  Corey [name], MRN:  [id number], DOB:  [birth year], LOS: 0 ADMISSION DATE:  03-28-23, CONSULTATION DATE:  03/15/2023 REFERRING MD:  Nicanor Alcon, EDP CHIEF COMPLAINT:  hypoglycemia   History of Present Illness:  Corey [name] is a 77 year old gentleman with a history of HFrEF, urinary retention with recent admission from 02/27/2023-03/13/2023 for decompensated heart failure.  He was discharged with Foley catheter for urinary retention.  Today at his rehab facility he was more confused than normal with low blood sugars from his fingers.  He was brought back to the hospital today.  He has had a normal blood sugar on his labs but low blood sugars from his fingers.  No blood sugars have been obtained from other sites since.  He denies pain.  Family reports that his cough is baseline and he routinely has an atypical breathing pattern where he breathes fast and slow.  Hematuria is new today.  In the emergency department he was noted to have dropping temperature and due to concern for sepsis was started on empiric IV antibiotics.   Pertinent  Medical History  DM PPM CKD IV Chronic HFrEF, cardiac amyloidosis OSA H/o CVA HTN  Significant Hospital Events: Including procedures, antibiotic start and stop dates in addition to other pertinent events   2023/03/28 brought to ED, admitted for hypoglycemia, hypothermia, concern for sepsis versus decompensated heart failure   Interim History / Subjective:  Subjectively doing better this morning.  Still having some dyspnea but otherwise feels like himself.  Denies any abdominal pain and is hungry.  Objective   Blood pressure (!) 146/129, pulse 74, temperature (!) 97.5 F (36.4 C), temperature source Axillary, resp. rate (!) 27, height 5\' 6"  (1.676 m), weight 76.2 kg, SpO2 100%.        Intake/Output Summary (Last 24 hours) at 03/15/2023 0640 Last data filed at 03/15/2023 0600 Gross per 24 hour  Intake 977.68 ml  Output 550 ml  Net 427.68 ml   Filed  Weights   03/15/23 0200  Weight: 76.2 kg    Examination: Constitutional: chronically ill appearing elderly male. In no acute distress. HENT: Normocephalic, atraumatic,  Eyes: Sclera non-icteric, PERRL, EOM intact Neck: JVD to the TMJ Cardio:Regular rate and rhythm. Distant heart sounds. Extremities warm and well perfused Pulm: mild diffuse crackles. Normal work of breathing Abdomen: Soft, non-tender, non-distended, positive bowel sounds. YQI:HKVQQ LE edema, muscle and tissue hypertrophy in the shoulders and wrists bilaterally Skin:Warm and dry. Neuro:Alert and oriented to person and place. No focal deficit noted.  Resolved Hospital Problem list   Acute encephalopathy  Assessment & Plan:  Sepsis with possible UTI-presented with low temperatures and low white count with bacteriuria (unsure of urine collection with indwelling Foley).  History of Pseudomonas UTI in 2014.  Empirically started on cefepime, metronidazole, and vancomycin.  Blood and urine cultures drawn.  Temperature stabilized with Bair hugger and has not become febrile.  White count stable at 3.6.  Lactic acid 4.3. Hypoglycemia w/ hx of T2DM on insulin-discrepancies in blood glucose from fingerstick to BMP to earlobe.  Could be due to perfusion to distal extremities.  Glucose stable between 140 and 300 since admission.  Did receive 150 mL of D50 and 1 mg of glucagon in the ED. Records indicate he is on 10 units glargine outside the hospital Acute encephalopathy-likely multifactorial with concern for sepsis 2/2 UTI, azotemia on top of CKD stage IV, hyperglycemia, acute heart failure exacerbation, hypercapnia, and underlying cognitive impairment.  TSH/T4 and cortisol within normal limits  ABG with mild hypercapnia but normal pH.  No severe electrolyte derangements.  CT head without significant abnormalities to explain encephalopathy.  Overall improving with current interventions Hypothermia-most likely 2/2 sepsis and hypoglycemia.  As  above TSH/T4/cortisol within normal limits.  Improving with bear hugger Acute vs acute on chronic hypercapnic respiratory failure-discharge from the hospital a few days ago on 3 L of oxygen through the nasal cannula and is stable on this much oxygen here.  However he is mildly hypercapnic on ABG with a pCO2 of 49 however pH is normal.  It does not appear that he is on oxygen at home and we will attempt to wean as tolerated during admission Acute on chronic HFrEF with history of cardiac amyloidosis-most recent echo 02/28/2023 with LVEF 20-25% with severe decreased LV/RV function, global hypokinesis, severe LVH, mildly elevated PASP and RVSP.  Weight appears to be largely stable since discharge a few days ago.  Initially held diuretics and fluids.  BNP above 4500.  BP doing well without pressure support and producing adequate urine with 550 mL out overnight.  Creatinine appears at baseline and stable from discharge.  Not in cardiogenic shock or cardio renal syndrome. He does have pulmonary congestion on cxr and is on prescribed torsemide 40 mg daily. At last admission he was getting last 120 mg IV q6h. He may need a higher home torsemide dose before d/c. Diagnosed with ATTR amyloidosis and is currently on tafamidis outpatient which we do not have on formula.  If unable to get this medication from home may need to discuss with pharmacy and/or heart failure team. Chronic hypotension- on midodrine outpatient Atrial fibrillation-eliquis outpatient CKD stage IV- h/o failed left BCF (placed 01/31/22). Cr here stable and adequate UOP with 700 mL out since admission.  Hyperkalemia- K 5.8 on admission and given calcium and lokelma with improvement to 3.7 today Urinary retention-unclear if requiring chronic indwelling foley but was discharged from the hospital this week with a foley for retention. Changed in the ED Hematuria- gross hematuria likely 2/2 foley trauma   Plan Continue cefepime with hx of Pseudomonas  UTI Lasix 80 mg IV, may need more based on requirements last admission Wean O2 for SpO2 >90 SLP eval, hold insulin until eating continue strict ins and outs Continue midodrine Continue heparin drip and transition to home eliquis tonight  Monitor urine output and keep Foley in  Stable for transfer to med tele.  Best Practice (right click and "Reselect all SmartList Selections" daily)   Diet/type: NPO pending SLP eval DVT prophylaxis: systemic heparin GI prophylaxis: N/A and H2B Lines: N/A Foley:  Yes, and it is still needed Code Status:  full code Last date of multidisciplinary goals of care discussion [03/15/2023]  Labs   CBC: Recent Labs  Lab 03/12/23 0223 03/12/2023 2215 02/26/2023 2355 03/15/23 0101 03/15/23 0138 03/15/23 0255  WBC 3.8* 4.5  --   --  4.4 3.6*  HGB 11.0* 14.5 16.3 14.6 10.8* 13.2  HCT 34.6* 47.1 48.0 43.0 34.2* 43.7  MCV 81.2 83.2  --   --  81.0 83.1  PLT 205 301  --   --  297 205    Basic Metabolic Panel: Recent Labs  Lab 03/09/23 0219 03/10/23 0216 03/13/23 0816 02/20/2023 2215 02/16/2023 2355 03/15/23 0101 03/15/23 0138 03/15/23 0255 03/15/23 0420  NA 138   < > 135 135 127* 134* 132* 136 134*  K 3.7   < > 4.0 5.5* >8.5* 3.9 4.4 5.8* 3.7  CL 90*   < >  88* 88*  --   --  89* 89* 93*  CO2 36*   < > 36* 31  --   --  29 28 28   GLUCOSE 114*   < > 143* 119*  --   --  226* 151* 230*  BUN 92*   < > 123* 127*  --   --  122* 123* 112*  CREATININE 2.98*   < > 3.00* 2.94*  --   --  2.94* 2.75* 2.66*  CALCIUM 9.0   < > 9.4 9.4  --   --  9.2 9.3 10.4*  MG 2.7*  --   --   --   --   --   --  3.0*  --   PHOS  --   --   --   --   --   --   --  6.2*  --    < > = values in this interval not displayed.   GFR: Estimated Creatinine Clearance: 22 mL/min (A) (by C-G formula based on SCr of 2.66 mg/dL (H)). Recent Labs  Lab 03/12/23 0223 15-Mar-2023 2215 03/15/23 0138 03/15/23 0255 03/15/23 0256  WBC 3.8* 4.5 4.4 3.6*  --   LATICACIDVEN  --   --   --   --  4.3*     Liver Function Tests: Recent Labs  Lab 03-15-23 2215  AST 103*  ALT 41  ALKPHOS 106  BILITOT 2.4*  PROT 7.3  ALBUMIN 2.8*   No results for input(s): "LIPASE", "AMYLASE" in the last 168 hours. Recent Labs  Lab 03/15/23 0256  AMMONIA 53*    ABG    Component Value Date/Time   PHART 7.427 03/15/2023 0101   PCO2ART 49.3 (H) 03/15/2023 0101   PO2ART 422 (H) 03/15/2023 0101   HCO3 32.9 (H) 03/15/2023 0101   TCO2 34 (H) 03/15/2023 0101   ACIDBASEDEF 4.0 (H) 03/24/2022 1428   ACIDBASEDEF 3.0 (H) 03/24/2022 1428   O2SAT 100 03/15/2023 0101     Coagulation Profile: No results for input(s): "INR", "PROTIME" in the last 168 hours.  Cardiac Enzymes: No results for input(s): "CKTOTAL", "CKMB", "CKMBINDEX", "TROPONINI" in the last 168 hours.  HbA1C: Hgb A1c MFr Bld  Date/Time Value Ref Range Status  02/13/2023 08:45 AM 6.1 4.6 - 6.5 % Final    Comment:    Glycemic Control Guidelines for People with Diabetes:Non Diabetic:  <6%Goal of Therapy: <7%Additional Action Suggested:  >8%   09/19/2022 10:03 AM 7.0 (H) 4.6 - 6.5 % Final    Comment:    Glycemic Control Guidelines for People with Diabetes:Non Diabetic:  <6%Goal of Therapy: <7%Additional Action Suggested:  >8%     CBG: Recent Labs  Lab 03/15/23 2346 03/15/23 0004 03/15/23 0039 03/15/23 0201 03/15/23 0341  GLUCAP 343* 290* 299* 260* 140*    Review of Systems:   No abdominal pain, nausea, or vomiting.  Past Medical History:  He,  has a past medical history of Cataract, CHF (congestive heart failure) (HCC), Chronic heart failure with preserved ejection fraction (HFpEF) (HCC), Colon polyp (2003), Coronary artery disease, CVA (cerebral infarction) (2014), Diabetes mellitus, Diverticulosis (2003), Dyspnea, Gout, Hereditary cardiac amyloidosis (HCC), Hypertension, Myocardial infarction (HCC) (09/19/2012), OSA (obstructive sleep apnea) (03/27/2018), Pneumonia (09/29/2011), Prostate cancer (HCC), Renal insufficiency,  Seizures (HCC) (09/19/2012), and Stroke (HCC).   Surgical History:   Past Surgical History:  Procedure Laterality Date   A-FLUTTER ABLATION N/A 06/23/2022   Procedure: A-FLUTTER ABLATION;  Surgeon: Mealor, Roberts Gaudy, MD;  Location: [facility] INVASIVE CV  LAB;  Service: Cardiovascular;  Laterality: N/A;   A/V FISTULAGRAM Left 04/28/2022   Procedure: A/V Fistulagram;  Surgeon: Chuck Hint, MD;  Location: [facility] INVASIVE CV LAB;  Service: Cardiovascular;  Laterality: Left;   AV FISTULA PLACEMENT Left 01/31/2022   Procedure: LEFT ARM ARTERIOVENOUS (AV) FISTULA CREATION;  Surgeon: Maeola Harman, MD;  Location: [facility] OR;  Service: Vascular;  Laterality: Left;  regional block to left arm   CARDIOVERSION N/A 03/24/2022   Procedure: CARDIOVERSION;  Surgeon: Dorthula Nettles, DO;  Location: [facility] ENDOSCOPY;  Service: Cardiovascular;  Laterality: N/A;   COLONOSCOPY W/ POLYPECTOMY  2003   no F/U (SOC discussed 12/03/12)   INGUINAL HERNIA REPAIR Left 06/16/2021   Procedure: LAPAROSCOPIC, POSSIBLY OPEN LEFT INGUINAL HERNIA REPAIR WITH MESH;  Surgeon: Quentin Ore, MD;  Location: [facility] ORS;  Service: General;  Laterality: Left;   INSERTION OF MESH N/A 07/16/2017   Procedure: INSERTION OF MESH;  Surgeon: Berna Bue, MD;  Location: [facility] OR;  Service: General;  Laterality: N/A;   PACEMAKER IMPLANT N/A 12/28/2022   Procedure: PACEMAKER IMPLANT;  Surgeon: Maurice Small, MD;  Location: [facility] INVASIVE CV LAB;  Service: Cardiovascular;  Laterality: N/A;   PEG PLACEMENT  2014   10/08/12-12/10/12   PEG TUBE REMOVAL  2014   PERIPHERAL VASCULAR BALLOON ANGIOPLASTY  04/28/2022   Procedure: PERIPHERAL VASCULAR BALLOON ANGIOPLASTY;  Surgeon: Chuck Hint, MD;  Location: [facility] INVASIVE CV LAB;  Service: Cardiovascular;;   PROSTATE BIOPSY     RIGHT HEART CATH N/A 03/24/2022   Procedure: RIGHT HEART CATH;  Surgeon: Dorthula Nettles, DO;  Location: [facility] INVASIVE CV LAB;  Service: Cardiovascular;   Laterality: N/A;   RIGHT/LEFT HEART CATH AND CORONARY ANGIOGRAPHY N/A 09/20/2020   Procedure: RIGHT/LEFT HEART CATH AND CORONARY ANGIOGRAPHY;  Surgeon: Marykay Lex, MD;  Location: [facility] INVASIVE CV LAB;  Service: Cardiovascular;  Laterality: N/A;   SUBQ ICD IMPLANT N/A 09/04/2022   Procedure: SUBQ ICD IMPLANT;  Surgeon: Maurice Small, MD;  Location: [facility] INVASIVE CV LAB;  Service: Cardiovascular;  Laterality: N/A;   TEE WITHOUT CARDIOVERSION N/A 09/27/2020   Procedure: TRANSESOPHAGEAL ECHOCARDIOGRAM (TEE);  Surgeon: Linden Dolin, MD;  Location: [facility] OR;  Service: Thoracic;  Laterality: N/A;   TEE WITHOUT CARDIOVERSION N/A 03/24/2022   Procedure: TRANSESOPHAGEAL ECHOCARDIOGRAM (TEE);  Surgeon: Dorthula Nettles, DO;  Location: [facility] ENDOSCOPY;  Service: Cardiovascular;  Laterality: N/A;   TEE WITHOUT CARDIOVERSION N/A 06/23/2022   Procedure: TRANSESOPHAGEAL ECHOCARDIOGRAM (TEE);  Surgeon: Maurice Small, MD;  Location: [facility] INVASIVE CV LAB;  Service: Cardiovascular;  Laterality: N/A;   TRACHEOSTOMY  2014   09/30/12-10/20/12   VENTRAL HERNIA REPAIR N/A 07/16/2017   Procedure: LAPAROSCOPIC VENTRAL HERNIA REPAIR WITH MESH;  Surgeon: Berna Bue, MD;  Location: [facility] OR;  Service: General;  Laterality: N/A;   wrist aspiration  02/16/2012    monosodium urate crystals; Dr Melvyn Novas     Social History:   reports that he quit smoking about 54 years ago. His smoking use included cigarettes. He started smoking about 55 years ago. He has a 0.3 pack-year smoking history. He has never used smokeless tobacco. He reports that he does not drink alcohol and does not use drugs.   Family History:  His family history includes Cancer in his father; Diabetes in his father and mother; Heart failure in his brother and mother; Hypertension in his father and mother; Prostate cancer in his father. There is no history of Stroke, Heart disease, or Colon  cancer.   Allergies Allergies  Allergen Reactions    Hydrochlorothiazide Other (See Comments)    Gout , uncontrolled diabetes and renal insufficiency     Home Medications  Prior to Admission medications   Medication Sig Start Date End Date Taking? Authorizing Provider  acetaminophen (TYLENOL) 500 MG tablet Take 1,000 mg by mouth every 8 (eight) hours as needed for moderate pain.   Yes [provider]  albuterol (VENTOLIN HFA) 108 (90 Base) MCG/ACT inhaler Inhale 2 puffs into the lungs every 4 (four) hours as needed for wheezing or shortness of breath. 11/14/21  Yes Valinda Hoar, NP  allopurinol (ZYLOPRIM) 100 MG tablet Take 1/2 (one-half) tablet by mouth once daily 01/18/23  Yes Burns, [name], MD  apixaban (ELIQUIS) 5 MG TABS tablet Take 1 tablet by mouth twice daily 01/29/23  Yes Mealor, Roberts Gaudy, MD  atorvastatin (LIPITOR) 10 MG tablet Take 10 mg by mouth daily.   Yes [provider]  bismuth subsalicylate (PEPTO BISMOL) 262 MG/15ML suspension Take 30 mLs by mouth every 6 (six) hours as needed for indigestion or diarrhea or loose stools.   Yes [provider]  cholecalciferol (VITAMIN D3) 25 MCG (1000 UNIT) tablet Take 1,000 Units by mouth daily.   Yes [provider]  Glucagon 1 MG/0.2ML SOAJ Inject 1 application  into the skin once.   Yes [provider]  glucose blood (ACCU-CHEK AVIVA PLUS) test strip Use to check blood glucose 4 times a day. 12/25/22  Yes Reather Littler, MD  insulin glargine-yfgn (SEMGLEE) 100 UNIT/ML injection Inject 0.1 mLs (10 Units total) into the skin daily. 11-Apr-2023  Yes Zannie Cove, MD  ipratropium-albuterol (DUONEB) 0.5-2.5 (3) MG/3ML SOLN Take 3 mLs by nebulization every 6 (six) hours as needed (SOB or Wheezing  via nebulizer).   Yes [provider]  midodrine (PROAMATINE) 5 MG tablet Take 1 tablet (5 mg total) by mouth 2 (two) times daily with a meal. 03/13/23  Yes Zannie Cove, MD  polyethylene glycol (MIRALAX / GLYCOLAX) 17 g packet Take 17 g by mouth  daily as needed. 03/13/23  Yes Zannie Cove, MD  predniSONE (DELTASONE) 20 MG tablet Take 1 tablet (20 mg total) by mouth daily with breakfast for 2 days. 04/11/2023 03/16/23 Yes Zannie Cove, MD  propranolol ER (INDERAL LA) 60 MG 24 hr capsule Take 1 capsule (60 mg total) by mouth daily. 2023-04-11  Yes Zannie Cove, MD  rosuvastatin (CRESTOR) 5 MG tablet Take 1 tablet (5 mg total) by mouth daily. 06/26/22  Yes Mealor, Roberts Gaudy, MD  sennosides-docusate sodium (SENOKOT-S) 8.6-50 MG tablet Take 2 tablets by mouth daily as needed for constipation.   Yes [provider]  sodium bicarbonate 650 MG tablet TAKE 1 TABLET BY MOUTH TWICE DAILY . APPOINTMENT REQUIRED FOR FUTURE REFILLS 01/18/23  Yes Burns, [name], MD  Tafamidis [facility]) 61 MG CAPS Take 1 capsule (61 mg total) by mouth daily. 06/19/22  Yes Jake Bathe, MD  torsemide (DEMADEX) 20 MG tablet Take 2 tablets (40 mg total) by mouth daily. 03/13/23  Yes Zannie Cove, MD  TRUEplus Lancets 33G MISC USE 4 (FOUR) TIMES DAILY 04/04/21  Yes Reather Littler, MD     Critical care time: 34    Rocky Morel, DO Internal Medicine Resident, PGY-2 Pager# [phone] [facility] 03/15/2023 6:40 AM  For contact information, see Amion. If no response to pager, please call PCCM consult pager. After hours, 7PM- 7AM, please call Elink.

## 2023-03-15 NOTE — Inpatient Diabetes Management (Signed)
Inpatient Diabetes Program Recommendations  AACE/ADA: New Consensus Statement on Inpatient Glycemic Control (2015)  Target Ranges:  Prepandial:   less than 140 mg/dL      Peak postprandial:   less than 180 mg/dL (1-2 hours)      Critically ill patients:  140 - 180 mg/dL   Lab Results  Component Value Date   GLUCAP 145 (H) 03/15/2023   HGBA1C 6.1 02/13/2023    Review of Glycemic Control  Latest Reference Range & Units 03/15/23 00:39 03/15/23 02:01 03/15/23 03:41 03/15/23 07:58  Glucose-Capillary 70 - 99 mg/dL 409 (H) 811 (H) 914 (H) 145 (H)   Diabetes history: DM  Outpatient Diabetes medications:  Semglee 10 units daily  Current orders for Inpatient glycemic control:  Novolog 0-15 units tid with meals Semglee 10 units daily  Inpatient Diabetes Program Recommendations:    Consider reducing Novolog correction to 0-9 units (sensitive).   Thanks,  Beryl Meager, RN, BC-ADM Inpatient Diabetes Coordinator Pager [phone]  (8a-5p)

## 2023-03-16 DIAGNOSIS — G9341 Metabolic encephalopathy: Secondary | ICD-10-CM | POA: Diagnosis not present

## 2023-03-16 DIAGNOSIS — N289 Disorder of kidney and ureter, unspecified: Secondary | ICD-10-CM

## 2023-03-16 DIAGNOSIS — E162 Hypoglycemia, unspecified: Secondary | ICD-10-CM | POA: Diagnosis not present

## 2023-03-16 LAB — CBC WITH DIFFERENTIAL/PLATELET
Abs Immature Granulocytes: 0.04 10*3/uL (ref 0.00–0.07)
Basophils Absolute: 0 10*3/uL (ref 0.0–0.1)
Basophils Relative: 0 %
Eosinophils Absolute: 0 10*3/uL (ref 0.0–0.5)
Eosinophils Relative: 0 %
HCT: 41.7 % (ref 39.0–52.0)
Hemoglobin: 13.1 g/dL (ref 13.0–17.0)
Immature Granulocytes: 1 %
Lymphocytes Relative: 11 %
Lymphs Abs: 0.5 10*3/uL — ABNORMAL LOW (ref 0.7–4.0)
MCH: 25.4 pg — ABNORMAL LOW (ref 26.0–34.0)
MCHC: 31.4 g/dL (ref 30.0–36.0)
MCV: 81 fL (ref 80.0–100.0)
Monocytes Absolute: 1 10*3/uL (ref 0.1–1.0)
Monocytes Relative: 21 %
Neutro Abs: 3 10*3/uL (ref 1.7–7.7)
Neutrophils Relative %: 67 %
Platelets: 231 10*3/uL (ref 150–400)
RBC: 5.15 MIL/uL (ref 4.22–5.81)
RDW: 19.7 % — ABNORMAL HIGH (ref 11.5–15.5)
WBC: 4.6 10*3/uL (ref 4.0–10.5)
nRBC: 0 % (ref 0.0–0.2)

## 2023-03-16 LAB — RENAL FUNCTION PANEL
Albumin: 2.8 g/dL — ABNORMAL LOW (ref 3.5–5.0)
Anion gap: 16 — ABNORMAL HIGH (ref 5–15)
BUN: 126 mg/dL — ABNORMAL HIGH (ref 8–23)
CO2: 30 mmol/L (ref 22–32)
Calcium: 9.5 mg/dL (ref 8.9–10.3)
Chloride: 90 mmol/L — ABNORMAL LOW (ref 98–111)
Creatinine, Ser: 3.07 mg/dL — ABNORMAL HIGH (ref 0.61–1.24)
GFR, Estimated: 21 mL/min — ABNORMAL LOW (ref >60)
Glucose, Bld: 84 mg/dL (ref 70–99)
Phosphorus: 5.4 mg/dL — ABNORMAL HIGH (ref 2.5–4.6)
Potassium: 5.2 mmol/L — ABNORMAL HIGH (ref 3.5–5.1)
Sodium: 136 mmol/L (ref 135–145)

## 2023-03-16 LAB — GLUCOSE, CAPILLARY
Glucose-Capillary: 62 mg/dL — ABNORMAL LOW (ref 70–99)
Glucose-Capillary: 35 mg/dL — CL (ref 70–99)
Glucose-Capillary: 57 mg/dL — ABNORMAL LOW (ref 70–99)
Glucose-Capillary: 91 mg/dL (ref 70–99)
Glucose-Capillary: 92 mg/dL (ref 70–99)
Glucose-Capillary: 60 mg/dL — ABNORMAL LOW (ref 70–99)
Glucose-Capillary: 93 mg/dL (ref 70–99)
Glucose-Capillary: 183 mg/dL — ABNORMAL HIGH (ref 70–99)
Glucose-Capillary: 113 mg/dL — ABNORMAL HIGH (ref 70–99)

## 2023-03-16 LAB — URINE CULTURE: Culture: <10000 — AB

## 2023-03-16 LAB — CBC
HCT: 40.3 % (ref 39.0–52.0)
Hemoglobin: 12.6 g/dL — ABNORMAL LOW (ref 13.0–17.0)
MCH: 25.1 pg — ABNORMAL LOW (ref 26.0–34.0)
MCHC: 31.3 g/dL (ref 30.0–36.0)
MCV: 80.4 fL (ref 80.0–100.0)
Platelets: 230 10*3/uL (ref 150–400)
RBC: 5.01 MIL/uL (ref 4.22–5.81)
RDW: 19.4 % — ABNORMAL HIGH (ref 11.5–15.5)
WBC: 4.3 10*3/uL (ref 4.0–10.5)
nRBC: 0 % (ref 0.0–0.2)

## 2023-03-16 LAB — BRAIN NATRIURETIC PEPTIDE: B Natriuretic Peptide: 4107.1 pg/mL — ABNORMAL HIGH (ref 0.0–100.0)

## 2023-03-16 MED ORDER — ALLOPURINOL 100 MG PO TABS
50.0000 mg | ORAL_TABLET | Freq: Every day | ORAL | Status: DC
  Administered 2023-03-16 – 2023-03-20 (×4): 50 mg via ORAL
  Filled 2023-03-16: qty 0.5
  Filled 2023-03-16: qty 1
  Filled 2023-03-16: qty 0.5
  Filled 2023-03-16: qty 1

## 2023-03-16 MED ORDER — TORSEMIDE 20 MG PO TABS
40.0000 mg | ORAL_TABLET | Freq: Every day | ORAL | Status: DC
  Administered 2023-03-16: 40 mg via ORAL
  Filled 2023-03-16: qty 2

## 2023-03-16 MED ORDER — ATORVASTATIN CALCIUM 10 MG PO TABS
10.0000 mg | ORAL_TABLET | Freq: Every day | ORAL | Status: DC
  Administered 2023-03-16 – 2023-03-20 (×4): 10 mg via ORAL
  Filled 2023-03-16 (×4): qty 1

## 2023-03-16 MED ORDER — DEXTROSE-SODIUM CHLORIDE 5-0.9 % IV SOLN: INTRAVENOUS | Status: DC

## 2023-03-16 MED ORDER — DEXTROSE 50 % IV SOLN
50.0000 mL | INTRAVENOUS | Status: DC | PRN
  Administered 2023-03-16 – 2023-03-20 (×3): 50 mL via INTRAVENOUS
  Filled 2023-03-16 (×4): qty 50

## 2023-03-16 MED ORDER — SODIUM BICARBONATE 650 MG PO TABS
650.0000 mg | ORAL_TABLET | Freq: Two times a day (BID) | ORAL | Status: DC
  Administered 2023-03-16 – 2023-03-18 (×4): 650 mg via ORAL
  Filled 2023-03-16 (×4): qty 1

## 2023-03-16 MED ORDER — PIPERACILLIN-TAZOBACTAM 3.375 G IVPB
3.3750 g | Freq: Three times a day (TID) | INTRAVENOUS | Status: AC
  Administered 2023-03-16 – 2023-03-18 (×9): 3.375 g via INTRAVENOUS
  Filled 2023-03-16 (×9): qty 50

## 2023-03-16 MED ORDER — TAFAMIDIS 61 MG PO CAPS: 61.0000 mg | ORAL_CAPSULE | Freq: Every day | ORAL | Status: DC

## 2023-03-16 MED ORDER — SODIUM ZIRCONIUM CYCLOSILICATE 10 G PO PACK
10.0000 g | PACK | Freq: Once | ORAL | Status: AC
  Administered 2023-03-16: 10 g via ORAL
  Filled 2023-03-16: qty 1

## 2023-03-16 MED ORDER — MIRTAZAPINE 15 MG PO TABS
15.0000 mg | ORAL_TABLET | Freq: Every day | ORAL | Status: DC
  Administered 2023-03-16 – 2023-03-21 (×5): 15 mg via ORAL
  Filled 2023-03-16 (×5): qty 1

## 2023-03-16 MED ORDER — IPRATROPIUM-ALBUTEROL 0.5-2.5 (3) MG/3ML IN SOLN: 3.0000 mL | Freq: Four times a day (QID) | RESPIRATORY_TRACT | Status: DC | PRN

## 2023-03-16 NOTE — Progress Notes (Signed)
[id number] Liaison Note:  This patient is currently enrolled in [facility] outpatient-based palliative care.   Please call for any outpatient based palliative care related questions or concerns.  Thank you, Glenna Fellows, BSN, RN, OCN [facility] Liaison [phone]

## 2023-03-16 NOTE — Progress Notes (Signed)
PROGRESS NOTE    Corey [name]  ZOX:[id number] DOB: [birth year] DOA: 02/14/2023 PCP: Pincus Sanes, MD   Chief Complaint  Patient presents with   Hypoglycemia    Brief Narrative:   Corey [name] is a 77 year old gentleman with a history of HFrEF, urinary retention with recent admission from 02/27/2023-03/13/2023 for decompensated heart failure.  He was discharged with Foley catheter for urinary retention.  at his rehab facility he was more confused than normal with low blood sugars from his fingers.  He was brought back to the hospital today.  He has had a normal blood sugar on his labs but low blood sugars from his fingers.  Family reports that his cough is baseline and he routinely has an atypical breathing pattern where he breathes fast and slow.  Hematuria is present and new .  In the emergency department he was noted to have dropping temperature, due to concern for sepsis he was admitted to ICU where he was observed overnight, and he was transferred to progressive care on 03/16/2023.    Assessment & Plan:   Principal Problem:   Acute metabolic encephalopathy  Sepsis, POA, with possible UTI (Foley related loss ) -patient presents with hypothermia, leukopenia, bacteriuria and elevated lactic acid -Workup significant for UTI, with recent Foley catheter insertion -Follow urine cultures, but for now continue with IV Zosyn given known history of Pseudomonas UTI in the past  Hypoglycemia Malnutrition Failure to thrive -CBG has dropped overnight where he is currently on D5 half NS, will continue, will hold long-acting insulin -Will consult nutritionist service -Will start on Remeron   Acute encephalopathy - potentially multifactorial- concern for UTI, azotemia from CKD.  And due to hypoglycemia -Avoid cefepime as he presents with encephalopathy  Hypothermia -Cortisol and TSH level reassuring -Resolved  Acute on chronic HFrEF; not likely volume down since discharge with increased Hb  since a few days ago Cardiac amyloidosis -resume home meds including toresmide, BB and Tafamadis .   Chronic hypotension -con't PTA midodrine   History of Afib -Continue with Eliquis and beta-blockers   Hyperkalemia -Will give Lokelma today  stage 4 chronic kidney disease (HCC) -Baseline creatinine 2.8-3.3, creatinine peaked at 3.3, now improving   Anemia of chronic renal disease, with hgb at 10,9 -Stable, has mild pancytopenia as well could be secondary to amyloidosis/tafamidis  DVT prophylaxis: Eliquis Code Status: Full Family Communication: None at bedside Disposition:   Status is: Inpatient    Consultants:  None  Subjective:  With hypoglycemia overnight requiring D5 drip, he reports poor appetite  Objective: Vitals:   03/15/23 1724 03/15/23 1927 03/16/23 0500 03/16/23 0800  BP: 129/81   (!) 119/43  Pulse: 62 62  63  Resp: (!) 24 (!) 22  (!) 21  Temp: 98.1 F (36.7 C) 98.6 F (37 C)  98.2 F (36.8 C)  TempSrc: Oral Oral  Oral  SpO2: 100% 100%  93%  Weight:   76.9 kg   Height:        Intake/Output Summary (Last 24 hours) at 03/16/2023 1158 Last data filed at 03/16/2023 0434 Gross per 24 hour  Intake 620 ml  Output 826 ml  Net -206 ml   Filed Weights   03/15/23 0200 03/16/23 0500  Weight: 76.2 kg 76.9 kg    Examination:  Awake Alert, Oriented X 3, No new F.N deficits, frail, deconditioned Symmetrical Chest wall movement, Good air movement bilaterally, CTAB RRR,No Gallops,Rubs or new Murmurs, No Parasternal Heave +ve B.Sounds, Abd Soft, No tenderness,  No rebound - guarding or rigidity. No Cyanosis, Clubbing ,+1 edema, No new Rash or bruise      Data Reviewed: I have personally reviewed following labs and imaging studies  CBC: Recent Labs  Lab 02/22/2023 2215 02/16/2023 2355 03/15/23 0101 03/15/23 0138 03/15/23 0255 03/16/23 0341 03/16/23 1015  WBC 4.5  --   --  4.4 3.6* 4.6 4.3  NEUTROABS  --   --   --   --   --  3.0  --   HGB 14.5   < >  14.6 10.8* 13.2 13.1 12.6*  HCT 47.1   < > 43.0 34.2* 43.7 41.7 40.3  MCV 83.2  --   --  81.0 83.1 81.0 80.4  PLT 301  --   --  297 205 231 230   < > = values in this interval not displayed.    Basic Metabolic Panel: Recent Labs  Lab 02/25/2023 2215 03/05/2023 2355 03/15/23 0101 03/15/23 0138 03/15/23 0255 03/15/23 0420 03/16/23 1015  NA 135   < > 134* 132* 136 134* 136  K 5.5*   < > 3.9 4.4 5.8* 3.7 5.2*  CL 88*  --   --  89* 89* 93* 90*  CO2 31  --   --  29 28 28 30   GLUCOSE 119*  --   --  226* 151* 230* 84  BUN 127*  --   --  122* 123* 112* 126*  CREATININE 2.94*  --   --  2.94* 2.75* 2.66* 3.07*  CALCIUM 9.4  --   --  9.2 9.3 10.4* 9.5  MG  --   --   --   --  3.0*  --   --   PHOS  --   --   --   --  6.2*  --  5.4*   < > = values in this interval not displayed.    GFR: Estimated Creatinine Clearance: 20.6 mL/min (A) (by C-G formula based on SCr of 3.07 mg/dL (H)).  Liver Function Tests: Recent Labs  Lab 03/04/2023 2215 03/16/23 1015  AST 103*  --   ALT 41  --   ALKPHOS 106  --   BILITOT 2.4*  --   PROT 7.3  --   ALBUMIN 2.8* 2.8*    CBG: Recent Labs  Lab 03/16/23 0416 03/16/23 0507 03/16/23 0650 03/16/23 0756 03/16/23 1138  GLUCAP 35* 57* 91 92 60*     Recent Results (from the past 240 hour(s))  Blood culture (routine x 2)     Status: None (Preliminary result)   Collection Time: 03/15/23 12:24 AM   Specimen: BLOOD RIGHT ARM  Result Value Ref Range Status   Specimen Description BLOOD RIGHT ARM  Final   Special Requests   Final    BOTTLES DRAWN AEROBIC AND ANAEROBIC Blood Culture adequate volume   Culture   Final    NO GROWTH 1 DAY Performed at [facility], [address] [place], [place] [address]    Report Status PENDING  Incomplete  Blood culture (routine x 2)     Status: None (Preliminary result)   Collection Time: 03/15/23  1:38 AM   Specimen: BLOOD  Result Value Ref Range Status   Specimen Description BLOOD LEFT ANTECUBITAL  Final    Special Requests   Final    BOTTLES DRAWN AEROBIC ONLY Blood Culture adequate volume   Culture   Final    NO GROWTH 1 DAY Performed at [facility]  Lab, [address], [place], [place] [address]    Report Status PENDING  Incomplete  MRSA Next Gen by PCR, Nasal     Status: None   Collection Time: 03/15/23  2:01 AM   Specimen: Nasal Mucosa; Nasal Swab  Result Value Ref Range Status   MRSA by PCR Next Gen NOT DETECTED NOT DETECTED Final    Comment: (NOTE) The GeneXpert MRSA Assay (FDA approved for NASAL specimens only), is one component of a comprehensive MRSA colonization surveillance program. It is not intended to diagnose MRSA infection nor to guide or monitor treatment for MRSA infections. Test performance is not FDA approved in patients less than 13 years old. Performed at [facility], [address] [place], [place] [address]   Remove and replace urinary cath (placed > 5 days) then obtain urine culture from new indwelling urinary catheter.     Status: Abnormal   Collection Time: 03/15/23  2:26 AM   Specimen: Urine, Catheterized  Result Value Ref Range Status   Specimen Description URINE, CATHETERIZED  Final   Special Requests NONE  Final   Culture (A)  Final    <10,000 COLONIES/mL INSIGNIFICANT GROWTH Performed at [facility], [address] [place], [place] [address]    Report Status 03/16/2023 FINAL  Final         Radiology Studies: CT Head Wo Contrast  Result Date: 03/15/2023 CLINICAL DATA:  Altered mental status EXAM: CT HEAD WITHOUT CONTRAST TECHNIQUE: Contiguous axial images were obtained from the base of the skull through the vertex without intravenous contrast. RADIATION DOSE REDUCTION: This exam was performed according to the departmental dose-optimization program which includes automated exposure control, adjustment of the mA and/or kV according to patient size and/or use of iterative reconstruction technique. COMPARISON:  CT brain 03/23/2022,  MRI 01/07/2015 FINDINGS: Brain: No acute territorial infarction, hemorrhage or focal intracranial mass. Small chronic left cerebellar infarct. Mild chronic small vessel ischemic changes of the white matter. Nonenlarged ventricles Vascular: No hyperdense vessels.  Carotid vascular calcification. Skull: No fracture. Extensive dural and tentorial calcifications as noted on prior exams potentially due to metabolic disease. Sinuses/Orbits: No acute finding. Other: Moderate gas incompletely visualized within the right greater than left masticator space. IMPRESSION: 1. No CT evidence for acute intracranial abnormality. 2. Mild chronic small vessel ischemic changes of the white matter. Small chronic left cerebellar infarct. 3. Moderate gas incompletely visualized within the right greater than left masticator space, this is of uncertain source and significance, correlate for any signs or symptoms of infection. Electronically Signed   By: Jasmine Pang M.D.   On: 03/15/2023 00:25   DG Chest Portable 1 View  Result Date: 03/15/2023 CLINICAL DATA:  Shortness of breath EXAM: PORTABLE CHEST 1 VIEW COMPARISON:  02/27/2023 FINDINGS: Cardiomegaly. Bilateral chest wall pacing devices. Pulmonary vascular congestion. Low lung volumes with basilar atelectasis and accentuation of the pulmonary vascularity. Question small bilateral pleural effusions. No pneumothorax. IMPRESSION: Exam is compromised by low lung volumes. Cardiomegaly with pulmonary vascular congestion and possible small bilateral pleural effusions. Electronically Signed   By: Minerva Fester M.D.   On: 02/24/2023 23:38        Scheduled Meds:  allopurinol  50 mg Oral Daily   apixaban  5 mg Oral BID   atorvastatin  10 mg Oral Daily   Chlorhexidine Gluconate Cloth  6 each Topical Daily   insulin aspart  0-9 Units Subcutaneous TID WC   midodrine  5 mg Oral BID WC  propranolol ER  60 mg Oral Daily   sodium bicarbonate  650 mg Oral BID   sodium zirconium  cyclosilicate  10 g Oral Once   Tafamidis  61 mg Oral Daily   torsemide  40 mg Oral Daily   Continuous Infusions:  dextrose 5 % and 0.9 % NaCl 50 mL/hr at 03/16/23 0600   piperacillin-tazobactam (ZOSYN)  IV 3.375 g (03/16/23 0848)     LOS: 1 day      Huey Bienenstock, MD [facility]   To contact the attending provider between 7A-7P or the covering provider during after hours 7P-7A, please log into the web site [email or web address] and access using universal [place] password for that web site. If you do not have the password, please call the hospital operator.  03/16/2023, 11:58 AM

## 2023-03-16 NOTE — Plan of Care (Signed)
Pt has rested quietly throughout the night with no distress noted. Alert and oriented to person and place. On O22LNC. Sats decreased to mid 80's and placed on O2. SR on the monitor. Foley cath intact to BSD and urine remains bloody. Pt keeps pulling on cath. Heparin drip was d/c'd at 2200 and eliquis started. Pt has had 2 large brown BM's. One was incontinent and the other the pt used the bedpan. Blood sugars have been taken frequently d/t low results. D50 given twice and several orange juices given. D5NS started this am. No complaints voiced.  Problem: Education: Goal: Knowledge of General Education information will improve Description: Including pain rating scale, medication(s)/side effects and non-pharmacologic comfort measures Outcome: Progressing   Problem: Health Behavior/Discharge Planning: Goal: Ability to manage health-related needs will improve Outcome: Progressing   Problem: Clinical Measurements: Goal: Ability to maintain clinical measurements within normal limits will improve Outcome: Progressing Goal: Will remain free from infection Outcome: Progressing Goal: Diagnostic test results will improve Outcome: Progressing Goal: Respiratory complications will improve Outcome: Progressing   Problem: Activity: Goal: Risk for activity intolerance will decrease Outcome: Progressing   Problem: Nutrition: Goal: Adequate nutrition will be maintained Outcome: Progressing   Problem: Coping: Goal: Level of anxiety will decrease Outcome: Progressing   Problem: Elimination: Goal: Will not experience complications related to bowel motility Outcome: Progressing Goal: Will not experience complications related to urinary retention Outcome: Progressing   Problem: Pain Management: Goal: General experience of comfort will improve Outcome: Progressing   Problem: Safety: Goal: Ability to remain free from injury will improve Outcome: Progressing   Problem: Skin Integrity: Goal: Risk  for impaired skin integrity will decrease Outcome: Progressing   Problem: Education: Goal: Ability to describe self-care measures that may prevent or decrease complications (Diabetes Survival Skills Education) will improve Outcome: Progressing Goal: Individualized Educational Video(s) Outcome: Progressing   Problem: Coping: Goal: Ability to adjust to condition or change in health will improve Outcome: Progressing   Problem: Fluid Volume: Goal: Ability to maintain a balanced intake and output will improve Outcome: Progressing   Problem: Health Behavior/Discharge Planning: Goal: Ability to identify and utilize available resources and services will improve Outcome: Progressing Goal: Ability to manage health-related needs will improve Outcome: Progressing   Problem: Metabolic: Goal: Ability to maintain appropriate glucose levels will improve Outcome: Progressing

## 2023-03-16 NOTE — Progress Notes (Addendum)
FSBS 29.  Improved with OJ.  Repeat FSBS 90 min after 62.  Again given OJ.  Patient did not get his overnight Lantus.  For now D5 drip not initiated given HFpEF and active fluid overload to treatment.  Repeat FSBS in 2 hours..  If less than 60 or patient is symptomatic may need to initiate D5 drip with patient's mentation appropriate. PRN D50 ordered  4:16AM. FSBS 36, dextrose gtt started. Lantus 10units [place] daily d/ced. FSBS q1hrX3, if stablizing q2hr.

## 2023-03-16 DEATH — deceased

## 2023-03-17 ENCOUNTER — Inpatient Hospital Stay (HOSPITAL_COMMUNITY): Payer: Medicare HMO

## 2023-03-17 DIAGNOSIS — J9601 Acute respiratory failure with hypoxia: Secondary | ICD-10-CM

## 2023-03-17 DIAGNOSIS — J9602 Acute respiratory failure with hypercapnia: Secondary | ICD-10-CM | POA: Diagnosis not present

## 2023-03-17 DIAGNOSIS — N289 Disorder of kidney and ureter, unspecified: Secondary | ICD-10-CM | POA: Diagnosis not present

## 2023-03-17 DIAGNOSIS — I5020 Unspecified systolic (congestive) heart failure: Secondary | ICD-10-CM | POA: Diagnosis not present

## 2023-03-17 DIAGNOSIS — E162 Hypoglycemia, unspecified: Secondary | ICD-10-CM | POA: Diagnosis not present

## 2023-03-17 DIAGNOSIS — I5021 Acute systolic (congestive) heart failure: Secondary | ICD-10-CM | POA: Diagnosis not present

## 2023-03-17 DIAGNOSIS — G9341 Metabolic encephalopathy: Secondary | ICD-10-CM | POA: Diagnosis not present

## 2023-03-17 DIAGNOSIS — L8990 Pressure ulcer of unspecified site, unspecified stage: Secondary | ICD-10-CM | POA: Insufficient documentation

## 2023-03-17 LAB — CBC
HCT: 44.7 % (ref 39.0–52.0)
Hemoglobin: 13.8 g/dL (ref 13.0–17.0)
MCH: 25.5 pg — ABNORMAL LOW (ref 26.0–34.0)
MCHC: 30.9 g/dL (ref 30.0–36.0)
MCV: 82.6 fL (ref 80.0–100.0)
Platelets: 282 10*3/uL (ref 150–400)
RBC: 5.41 MIL/uL (ref 4.22–5.81)
RDW: 19.9 % — ABNORMAL HIGH (ref 11.5–15.5)
WBC: 5.5 10*3/uL (ref 4.0–10.5)
nRBC: 0 % (ref 0.0–0.2)

## 2023-03-17 LAB — BASIC METABOLIC PANEL
Anion gap: 15 (ref 5–15)
Anion gap: 20 — ABNORMAL HIGH (ref 5–15)
BUN: 121 mg/dL — ABNORMAL HIGH (ref 8–23)
BUN: 121 mg/dL — ABNORMAL HIGH (ref 8–23)
CO2: 29 mmol/L (ref 22–32)
CO2: 26 mmol/L (ref 22–32)
Calcium: 9.6 mg/dL (ref 8.9–10.3)
Calcium: 9.3 mg/dL (ref 8.9–10.3)
Chloride: 95 mmol/L — ABNORMAL LOW (ref 98–111)
Chloride: 95 mmol/L — ABNORMAL LOW (ref 98–111)
Creatinine, Ser: 3.18 mg/dL — ABNORMAL HIGH (ref 0.61–1.24)
Creatinine, Ser: 3.16 mg/dL — ABNORMAL HIGH (ref 0.61–1.24)
GFR, Estimated: 20 mL/min — ABNORMAL LOW (ref >60)
GFR, Estimated: 20 mL/min — ABNORMAL LOW (ref >60)
Glucose, Bld: 50 mg/dL — ABNORMAL LOW (ref 70–99)
Glucose, Bld: 123 mg/dL — ABNORMAL HIGH (ref 70–99)
Potassium: 5 mmol/L (ref 3.5–5.1)
Potassium: 4.2 mmol/L (ref 3.5–5.1)
Sodium: 139 mmol/L (ref 135–145)
Sodium: 141 mmol/L (ref 135–145)

## 2023-03-17 LAB — HEPATIC FUNCTION PANEL
ALT: 37 U/L (ref 0–44)
AST: 78 U/L — ABNORMAL HIGH (ref 15–41)
Albumin: 2.9 g/dL — ABNORMAL LOW (ref 3.5–5.0)
Alkaline Phosphatase: 97 U/L (ref 38–126)
Bilirubin, Direct: 1 mg/dL — ABNORMAL HIGH (ref 0.0–0.2)
Indirect Bilirubin: 1 mg/dL — ABNORMAL HIGH (ref 0.3–0.9)
Total Bilirubin: 2 mg/dL — ABNORMAL HIGH (ref 0.3–1.2)
Total Protein: 7.7 g/dL (ref 6.5–8.1)

## 2023-03-17 LAB — LACTIC ACID, PLASMA
Lactic Acid, Venous: 5.2 mmol/L (ref 0.5–1.9)
Lactic Acid, Venous: 4.2 mmol/L (ref 0.5–1.9)

## 2023-03-17 LAB — BLOOD GAS, VENOUS
Acid-Base Excess: 1.3 mmol/L (ref 0.0–2.0)
Bicarbonate: 32 mmol/L — ABNORMAL HIGH (ref 20.0–28.0)
O2 Saturation: 39.1 %
Patient temperature: 37
pCO2, Ven: 82 mm[Hg] (ref 44–60)
pH, Ven: 7.2 — ABNORMAL LOW (ref 7.25–7.43)
pO2, Ven: 32 mm[Hg] (ref 32–45)

## 2023-03-17 LAB — BRAIN NATRIURETIC PEPTIDE: B Natriuretic Peptide: >4500 pg/mL — ABNORMAL HIGH (ref 0.0–100.0)

## 2023-03-17 LAB — GLUCOSE, CAPILLARY
Glucose-Capillary: 22 mg/dL — CL (ref 70–99)
Glucose-Capillary: 35 mg/dL — CL (ref 70–99)
Glucose-Capillary: 99 mg/dL (ref 70–99)
Glucose-Capillary: <10 mg/dL — CL (ref 70–99)
Glucose-Capillary: 171 mg/dL — ABNORMAL HIGH (ref 70–99)
Glucose-Capillary: 63 mg/dL — ABNORMAL LOW (ref 70–99)
Glucose-Capillary: 83 mg/dL (ref 70–99)
Glucose-Capillary: 134 mg/dL — ABNORMAL HIGH (ref 70–99)
Glucose-Capillary: 179 mg/dL — ABNORMAL HIGH (ref 70–99)
Glucose-Capillary: 186 mg/dL — ABNORMAL HIGH (ref 70–99)
Glucose-Capillary: 22 mg/dL — CL (ref 70–99)
Glucose-Capillary: 30 mg/dL — CL (ref 70–99)
Glucose-Capillary: <10 mg/dL — CL (ref 70–99)
Glucose-Capillary: 149 mg/dL — ABNORMAL HIGH (ref 70–99)
Glucose-Capillary: 95 mg/dL (ref 70–99)
Glucose-Capillary: 93 mg/dL (ref 70–99)

## 2023-03-17 LAB — HEMOGLOBIN AND HEMATOCRIT, BLOOD
HCT: 39.4 % (ref 39.0–52.0)
Hemoglobin: 12.1 g/dL — ABNORMAL LOW (ref 13.0–17.0)

## 2023-03-17 LAB — PROCALCITONIN: Procalcitonin: 20.95 ng/mL

## 2023-03-17 MED ORDER — SODIUM CHLORIDE 0.9 % IV BOLUS
500.0000 mL | Freq: Once | INTRAVENOUS | Status: AC | PRN
  Administered 2023-03-17: 500 mL via INTRAVENOUS

## 2023-03-17 MED ORDER — DEXTROSE 50 % IV SOLN
INTRAVENOUS | Status: AC
  Administered 2023-03-17: 50 mL via INTRAVENOUS
  Filled 2023-03-17: qty 50

## 2023-03-17 MED ORDER — DEXTROSE 50 % IV SOLN
50.0000 mL | INTRAVENOUS | Status: DC | PRN
  Administered 2023-03-17: 50 mL via INTRAVENOUS
  Filled 2023-03-17: qty 50

## 2023-03-17 MED ORDER — DEXTROSE-SODIUM CHLORIDE 5-0.9 % IV SOLN: INTRAVENOUS | Status: DC

## 2023-03-17 MED ORDER — FUROSEMIDE 10 MG/ML IJ SOLN
80.0000 mg | Freq: Once | INTRAMUSCULAR | Status: AC
  Administered 2023-03-17: 80 mg via INTRAVENOUS
  Filled 2023-03-17: qty 8

## 2023-03-17 MED ORDER — THIAMINE HCL 100 MG/ML IJ SOLN
100.0000 mg | INTRAMUSCULAR | Status: DC
  Administered 2023-03-17 – 2023-03-18 (×2): 100 mg via INTRAVENOUS
  Filled 2023-03-17 (×2): qty 2

## 2023-03-17 MED ORDER — PANTOPRAZOLE SODIUM 40 MG IV SOLR
40.0000 mg | INTRAVENOUS | Status: DC
  Administered 2023-03-17 – 2023-03-18 (×2): 40 mg via INTRAVENOUS
  Filled 2023-03-17 (×2): qty 10

## 2023-03-17 MED ORDER — DEXTROSE 10 % IV SOLN: INTRAVENOUS | Status: DC

## 2023-03-17 MED ORDER — SODIUM ZIRCONIUM CYCLOSILICATE 10 G PO PACK: 10.0000 g | PACK | Freq: Once | ORAL | Status: DC

## 2023-03-17 NOTE — IPAL (Signed)
  Interdisciplinary Goals of Care Family Meeting   Date carried out: 03/17/2023  Location of the meeting: Bedside  Member's involved: Nurse Practitioner and Family Member or next of kin- wife   Durable Power of Attorney or acting medical decision maker: Corey [name]  Discussion:  Went to consent for CVL given Cardiology request.  [name] turned into GOC.  Wife relays he has recently verbalized he is "tired" and acknowledges his decline in recent weeks and failure to improve despite aggressive medical care with chronic HF, cardiac amyloid, progressive renal failure, malnutrition, severe deconditioning and recurrent hospitalization. No further wishes for "poking or prodding".   At this time, we will continue current medical care but will not escalate if any further decline, no invasive procedures, and now DNR/ DNI.  Wife considering transition to full palliation as she doesn't want him to cause him any further suffering. Ongoing support provided, chaplin offered, politely declined at this time.   Code status:   Code Status: Limited: Do not attempt resuscitation (DNR) -DNR-LIMITED -Do Not Intubate/DNI    Disposition: Continue current acute care  Time spent for the meeting: 30 mins      [name], MSN, AG-ACNP-BC [facility] 03/17/2023, 2:00 PM  See Amion for pager If no response to pager , please call [phone] until 7pm After 7:00 pm call Elink  [phone]

## 2023-03-17 NOTE — Progress Notes (Signed)
Corey [name] [id number] Admission Data: 03/17/2023 12:50 PM Attending Provider: Oretha Milch, MD  ACZ:YSAYT, [name], MD Consults/ Treatment Team:   Corey [name] is a 77 y.o. male patient [facility]  awake, alert  & orientated  X 3,  Full Code, VSS - Blood pressure (!) 142/100, pulse 60, temperature (!) 96.2 F (35.7 C), temperature source Axillary, resp. rate 19, height 5\' 6"  (1.676 m), weight 76.9 kg, SpO2 100%., O2  on BiPAP, Tele 23M-10 placed.  IV site WDL:  forearm right, condition patent and no redness with a transparent dsg that's clean dry and intact.  Pt admitted to [facility] room 10.   Will cont to monitor and assist as needed.  Corey Gieselman Sherlynn Carbon, RN 03/17/2023 12:50 PM

## 2023-03-17 NOTE — Plan of Care (Signed)
Pt stable on [place] until about 0600 this morning. Pt was diaphoretic, and showing labored breathing. Unreadable O2 saturation. Pt placed on nonrebreather. Rapid was called. Initial glucose was 22. Repeated was 35. Pt was given dextrose and a NS bolus after low BP. Glucose and BP increased. Pt weened off of nonrebreather and now stable again. Plan to transfer to ICU and place on BiPAP per daytime MD.   Problem: Education: Goal: Knowledge of General Education information will improve Description: Including pain rating scale, medication(s)/side effects and non-pharmacologic comfort measures Outcome: Progressing   Problem: Health Behavior/Discharge Planning: Goal: Ability to manage health-related needs will improve Outcome: Progressing   Problem: Clinical Measurements: Goal: Ability to maintain clinical measurements within normal limits will improve Outcome: Progressing Goal: Will remain free from infection Outcome: Progressing Goal: Diagnostic test results will improve Outcome: Progressing Goal: Respiratory complications will improve Outcome: Progressing   Problem: Activity: Goal: Risk for activity intolerance will decrease Outcome: Progressing   Problem: Nutrition: Goal: Adequate nutrition will be maintained Outcome: Progressing   Problem: Coping: Goal: Level of anxiety will decrease Outcome: Progressing   Problem: Elimination: Goal: Will not experience complications related to bowel motility Outcome: Progressing Goal: Will not experience complications related to urinary retention Outcome: Progressing   Problem: Pain Management: Goal: General experience of comfort will improve Outcome: Progressing   Problem: Safety: Goal: Ability to remain free from injury will improve Outcome: Progressing   Problem: Skin Integrity: Goal: Risk for impaired skin integrity will decrease Outcome: Progressing   Problem: Education: Goal: Ability to describe self-care measures that may  prevent or decrease complications (Diabetes Survival Skills Education) will improve Outcome: Progressing Goal: Individualized Educational Video(s) Outcome: Progressing   Problem: Coping: Goal: Ability to adjust to condition or change in health will improve Outcome: Progressing   Problem: Fluid Volume: Goal: Ability to maintain a balanced intake and output will improve Outcome: Progressing   Problem: Health Behavior/Discharge Planning: Goal: Ability to identify and utilize available resources and services will improve Outcome: Progressing Goal: Ability to manage health-related needs will improve Outcome: Progressing   Problem: Metabolic: Goal: Ability to maintain appropriate glucose levels will improve Outcome: Progressing

## 2023-03-17 NOTE — Progress Notes (Signed)
Called by nursing patient's glucose 22, BP 51/28, RR 33.  Patient immediately given 1 amp of D50, 500 cc NS bolus given.  Patient difficult to awaken.  Rapid response called. Repeat glucose <22 =>>99. On my arrival patient on nonrebreather.  Vitals improved.  Patient's response improved, he is able to say his name and where he is.  ABG ordered, lactic acid level ordered.  135/97, 60, 32, sat 98%.  On rebreather has been weaned off.  On review: D5NS has been discontinued at 5:30 AM as the order had expired.

## 2023-03-17 NOTE — Progress Notes (Signed)
PROGRESS NOTE    Corey [name]  ZDG:[id number] DOB: [birth year] DOA: 02/15/2023 PCP: Pincus Sanes, MD   Chief Complaint  Patient presents with   Hypoglycemia    Brief Narrative:   Corey [name] is a 77 year old gentleman with a history of HFrEF, urinary retention with recent admission from 02/27/2023-03/13/2023 for decompensated heart failure.  He was discharged with Foley catheter for urinary retention.  at his rehab facility he was more confused than normal with low blood sugars from his fingers.  He was brought back to the hospital today.  He has had a normal blood sugar on his labs but low blood sugars from his fingers.  Family reports that his cough is baseline and he routinely has an atypical breathing pattern where he breathes fast and slow.  Hematuria is present and new .  In the emergency department he was noted to have dropping temperature, due to concern for sepsis he was admitted to ICU where he was observed overnight, and he was transferred to progressive care on 03/16/2023 as his mentation has improved, as well his hypoglycemia has improved on dextrose IV fluids, on 11/2 AM rapid response was called given hypotension, tachypnea, hypoxia and hypoglycemia, for which pressure improved with fluid bolus, and hypoglycemia resolved with D50 push, his proxy and increased work of breathing improved with BiPAP support.  Assessment & Plan:   Principal Problem:   Acute metabolic encephalopathy  Acute respiratory failure -Overnight patient hypoxic, tachypneic with increased work of breathing, repeat x-ray showing vascular congestion, but no significant change from before -Continue with BiPAP support -PCCM input greatly appreciated, patient will transfer to ICU  Hypotension -Blood pressure low this morning during rapid response but this has responded to fluid bolus, continue with midodrine -Septic versus cardiogenic origin, requested cardiology consult.  Sepsis, POA, with possible UTI  (Foley related ) -patient presents with hypothermia, leukopenia, bacteriuria and elevated lactic acid -Workup significant for UTI, with recent Foley catheter insertion -History of Pseudomonas UTI in the past, he was initially on IV cefepime, this was changed to Zosyn due to concern of encephalopathy with cefepime .  Hematuria -Patient with likely hemorrhagic cystitis, as well he is on Eliquis, his hemoglobin remained stable, if continues then we will consider holding Eliquis. -No evidence of clot or obstructions yet, but will order Foley flush every 4 hours.  Hypoglycemia Malnutrition Failure to thrive -Poor oral intake, with profound hypoglycemia, he is still remaining D5 half NS, all insulin is discontinued . -Started on Remeron .   Acute encephalopathy - potentially multifactorial- concern for UTI, azotemia from CKD.  And due to hypoglycemia -Avoid cefepime as he presents with encephalopathy  Hypothermia -Cortisol and TSH level reassuring -Resolved  Acute on chronic HFrEFCardiac amyloidosis -He is back on his home dose torsemide, cardiology consulted -Continue with home dose beta-blockers if blood pressure allows  -Wife to bring his Tafamadis today so it can be resumed.   Chronic hypotension -con't PTA midodrine   History of Afib -Continue with Eliquis  -Will hold beta-blockers given hypotension   Hyperkalemia -Will give Lokelma today  stage 4 chronic kidney disease (HCC) -Baseline creatinine 2.8-3.3, creatinine is trending up, 3.18 today, monitor closely as on torsemide  Anemia of chronic renal disease -Stable, has mild pancytopenia as well could be secondary to amyloidosis/tafamidis  DVT prophylaxis: Eliquis Code Status: Full Family Communication: None at bedside, I have updated wife by phone about transfer to ICU. Disposition:   Status is: Inpatient    Consultants:  PCCM Cardiology  Subjective:  Rapid response overnight, given hypotension, hypoglycemia  and increased work of breathing, started on BiPAP, transferred to ICU.    Objective: Vitals:   03/17/23 0636 03/17/23 0643 03/17/23 0645 03/17/23 0813  BP: 134/86 (!) 139/102 (!) 141/101   Pulse:   (!) 117   Resp: 18 (!) 31 (!) 40   Temp:    97.6 F (36.4 C)  TempSrc:    Rectal  SpO2:   (!) 79%   Weight:      Height:        Intake/Output Summary (Last 24 hours) at 03/17/2023 0814 Last data filed at 03/17/2023 0800 Gross per 24 hour  Intake 192.83 ml  Output 1275 ml  Net -1082.17 ml   Filed Weights   03/15/23 0200 03/16/23 0500  Weight: 76.2 kg 76.9 kg    Examination:  He is lethargic this morning, tachypneic, in respiratory distress  Tachypneic, diminished air entry at the bases, with diffuse Rales and bibasilar crackles, with use of accessory muscles  Regular rate and rhythm, no rubs or gallops  Abdomen soft, bowel sounds present  Extremities +1 edema, with chronic skin changes.    Foley catheter present, with significant hematuria.       Data Reviewed: I have personally reviewed following labs and imaging studies  CBC: Recent Labs  Lab 03/15/23 0138 03/15/23 0255 03/16/23 0341 03/16/23 1015 03/17/23 0458  WBC 4.4 3.6* 4.6 4.3 5.5  NEUTROABS  --   --  3.0  --   --   HGB 10.8* 13.2 13.1 12.6* 13.8  HCT 34.2* 43.7 41.7 40.3 44.7  MCV 81.0 83.1 81.0 80.4 82.6  PLT 297 205 231 230 282    Basic Metabolic Panel: Recent Labs  Lab 03/15/23 0138 03/15/23 0255 03/15/23 0420 03/16/23 1015 03/17/23 0458  NA 132* 136 134* 136 139  K 4.4 5.8* 3.7 5.2* 5.0  CL 89* 89* 93* 90* 95*  CO2 29 28 28 30 29   GLUCOSE 226* 151* 230* 84 50*  BUN 122* 123* 112* 126* 121*  CREATININE 2.94* 2.75* 2.66* 3.07* 3.18*  CALCIUM 9.2 9.3 10.4* 9.5 9.6  MG  --  3.0*  --   --   --   PHOS  --  6.2*  --  5.4*  --     GFR: Estimated Creatinine Clearance: 19.9 mL/min (A) (by C-G formula based on SCr of 3.18 mg/dL (H)).  Liver Function Tests: Recent Labs  Lab 03/05/2023 2215  03/16/23 1015  AST 103*  --   ALT 41  --   ALKPHOS 106  --   BILITOT 2.4*  --   PROT 7.3  --   ALBUMIN 2.8* 2.8*    CBG: Recent Labs  Lab 03/17/23 0632 03/17/23 0639 03/17/23 0642 03/17/23 0645 03/17/23 0758  GLUCAP 22* <10* 35* 99 171*     Recent Results (from the past 240 hour(s))  Blood culture (routine x 2)     Status: None (Preliminary result)   Collection Time: 03/15/23 12:24 AM   Specimen: BLOOD RIGHT ARM  Result Value Ref Range Status   Specimen Description BLOOD RIGHT ARM  Final   Special Requests   Final    BOTTLES DRAWN AEROBIC AND ANAEROBIC Blood Culture adequate volume   Culture   Final    NO GROWTH 1 DAY Performed at [facility], [address] [place], [place] [address]    Report Status PENDING  Incomplete  Blood culture (routine x 2)  Status: None (Preliminary result)   Collection Time: 03/15/23  1:38 AM   Specimen: BLOOD  Result Value Ref Range Status   Specimen Description BLOOD LEFT ANTECUBITAL  Final   Special Requests   Final    BOTTLES DRAWN AEROBIC ONLY Blood Culture adequate volume   Culture   Final    NO GROWTH 1 DAY Performed at [facility], [address] [place], [place] [address]    Report Status PENDING  Incomplete  MRSA Next Gen by PCR, Nasal     Status: None   Collection Time: 03/15/23  2:01 AM   Specimen: Nasal Mucosa; Nasal Swab  Result Value Ref Range Status   MRSA by PCR Next Gen NOT DETECTED NOT DETECTED Final    Comment: (NOTE) The GeneXpert MRSA Assay (FDA approved for NASAL specimens only), is one component of a comprehensive MRSA colonization surveillance program. It is not intended to diagnose MRSA infection nor to guide or monitor treatment for MRSA infections. Test performance is not FDA approved in patients less than 26 years old. Performed at [facility], [address] [place], [place] [address]   Remove and replace urinary cath (placed > 5 days) then obtain urine culture from new  indwelling urinary catheter.     Status: Abnormal   Collection Time: 03/15/23  2:26 AM   Specimen: Urine, Catheterized  Result Value Ref Range Status   Specimen Description URINE, CATHETERIZED  Final   Special Requests NONE  Final   Culture (A)  Final    <10,000 COLONIES/mL INSIGNIFICANT GROWTH Performed at [facility], [address], [place], [place] [address]    Report Status 03/16/2023 FINAL  Final         Radiology Studies: DG Chest Port 1 View  Result Date: 03/17/2023 CLINICAL DATA:  Respiratory distress. EXAM: PORTABLE CHEST 1 VIEW COMPARISON:  03/15/2023 FINDINGS: Low volume film. The cardio pericardial silhouette is enlarged. There is pulmonary vascular congestion without overt pulmonary edema. Interval improvement in aeration at the right base with persistent retrocardiac left base collapse/consolidation. Prominent skin folds overlie the right lung. Right-sided permanent pacemaker again noted. Telemetry leads overlie the chest. IMPRESSION: 1. Low volume film with pulmonary vascular congestion. 2. Interval improvement in aeration at the right base with persistent retrocardiac left base collapse/consolidation. Electronically Signed   By: Kennith Center M.D.   On: 03/17/2023 07:58        Scheduled Meds:  allopurinol  50 mg Oral Daily   atorvastatin  10 mg Oral Daily   Chlorhexidine Gluconate Cloth  6 each Topical Daily   midodrine  5 mg Oral BID WC   mirtazapine  15 mg Oral QHS   propranolol ER  60 mg Oral Daily   sodium bicarbonate  650 mg Oral BID   sodium zirconium cyclosilicate  10 g Oral Once   Tafamidis  61 mg Oral Daily   torsemide  40 mg Oral Daily   Continuous Infusions:  dextrose 5 % and 0.9 % NaCl 50 mL/hr at 03/17/23 0800   piperacillin-tazobactam (ZOSYN)  IV 12.5 mL/hr at 03/17/23 0800     LOS: 2 days      Huey Bienenstock, MD [facility]   To contact the attending provider between 7A-7P or the covering provider during after hours  7P-7A, please log into the web site [email or web address] and access using universal [place] password for that web site. If you do not have the password, please call the hospital  operator.  03/17/2023, 8:14 AM

## 2023-03-17 NOTE — Progress Notes (Signed)
NAME:  Corey [name], MRN:  [id number], DOB:  [birth year], LOS: 2 ADMISSION DATE:  02/15/2023, CONSULTATION DATE:  03/15/2023 REFERRING MD:  Corey [name], EDP CHIEF COMPLAINT:  hypoglycemia   History of Present Illness:  [name] is a 77 year old gentleman with a history of HFrEF, urinary retention with recent admission from 02/27/2023-03/13/2023 for decompensated heart failure.  He was discharged with Foley catheter for urinary retention.  Today at his rehab facility he was more confused than normal with low blood sugars from his fingers.  He was brought back to the hospital today.  He has had a normal blood sugar on his labs but low blood sugars from his fingers.  No blood sugars have been obtained from other sites since.  He denies pain.  Family reports that his cough is baseline and he routinely has an atypical breathing pattern where he breathes fast and slow.  Hematuria is new today.  In the emergency department he was noted to have dropping temperature and due to concern for sepsis was started on empiric IV antibiotics.   Pertinent  Medical History  DM PPM CKD IV Chronic HFrEF, cardiac amyloidosis OSA H/o CVA HTN  Significant Hospital Events: Including procedures, antibiotic start and stop dates in addition to other pertinent events   10/30 brought to ED, admitted for hypoglycemia, hypothermia, concern for sepsis versus decompensated heart failure  10/31 passed SLP 11/1 to [facility]  Interim History / Subjective:  PCCM reconsulted for episode of SBP 50's with AMS, hypoglycemia, and increased WOB now on BiPAP.  Treated with D50 and 1L bolus with resolution of hypotension and improved mental status.  Gross hematuria.  Hgb 13.8.  CXR  Pt denies cp, nausea, abd pain, and no significant SOB despite WOB Ax temp 94, rectal temp 97.8 Cardiology also consulted by [facility]  Objective   Blood pressure (!) 141/101, pulse (!) 117, temperature (!) 97.3 F (36.3 C), temperature source Axillary, resp. rate  (!) 40, height 5\' 6"  (1.676 m), weight 76.9 kg, SpO2 (!) 79%.        Intake/Output Summary (Last 24 hours) at 03/17/2023 6295 Last data filed at 03/17/2023 0800 Gross per 24 hour  Intake 192.83 ml  Output 1275 ml  Net -1082.17 ml   Filed Weights   03/15/23 0200 03/16/23 0500  Weight: 76.2 kg 76.9 kg    Examination: General:  AoC ill appearing elderly male sitting upright in bed on BIPAP HEENT: pupils 3/ r, full face bipap on, +jvd Neuro: lethargic but awakens to verbal, follows simple commands in all extremities, verbalizes name and year CV: appears vpaced with spikes and Aflutter PULM:  intermittent WOB on BiPAP 14/8, some irregular breathing patterns, 40%, clear anteriorly, diminished in bases GI: tense due to abd/ accessory muscle use, no obvious tenderness, scant BS, foley- with gross hematuria no clots  Extremities: cool/dry, no pitting LE edema  Skin: no rashes   Labs reviewed  Insignificant growth on UC   Resolved Hospital Problem list   Acute encephalopathy Hyperkalemia  Assessment & Plan:   Acute on chronic hypoxic and hypercapnic respiratory failure Hx COPD - tx to ICU, airway watch on BiPAP - remains on 40%, goal > 90% - remains full code per discussions with wife on phone, intubation risk  - check ABG - CXR ordered, lungs clear though - some hx of increased tachypnea- suspect deconditioning also contributing - NPO - consider lasix if BP remains stable  - cont BD  Acute encephalopathy - previously thought multifactorial w/concern  for sepsis 2/2 UTI, azotemia on top of CKD stage IV, hypoglycemia, HF exacerbation, and underlying cognitive impairment.   P:  - mental status improved after hypoglycemia and BP treated (one low SBP documented, otherwise at baseline).  Non focal on exam  - continue to closely monitor - goal for eurothermia, euglycemia, eunatermia, normoxia, and ABG pending to rule out hypercarbia  Recurrent hypoglycemia - multifactorial 2/2 to  malnutrition and sepsis  hx of T2DM on insulin - had been on D5NS MIVF expired at 0530, however was hypoglycemic in 50's on BMET.  Reportedly eating > 50% of his meals yesterday - trend CBGs still stabilized - SSI and long acting insulin stopped 10/31 - transition to D10 to minimize fluids - add thiamine given malnutrition - cortisol and TSH reassuring 10/31  PAF/ flutter  - tele monitoring  - EKG - hold eliquis given gross hematuria for now  Acute on chronic hypotension Sepsis with possible UTI - chronic indwelling foley, exchanged 10/31 - History of Pseudomonas UTI in 2014.   P:  - cont zosyn for now, day 3x - UC insignificant; follow BC - pending CXR - check lactic and LFTs - goal MAP > 65.  One documentation of SBP in 50's, ?accuracy - continue midodrine - trend WBC/ fever curve  - also, if recurrent hypotension, ? Acute HF,  Hypothermia - 97.9 on rectal.  Monitor, bair hugger prn   Biventricular HF with history of cardiac amyloidosis Mod PH - echo 02/28/2023 with LVEF 20-25% with severe decreased LV/RV function, global hypokinesis, severe LVH, mildly elevated PASP and RVSP.   - ATTR amyloidosis and is currently on tafamidis outpatient > wife to bring as we do not carry on formula P:  - cardiology consulted by [facility] - check EKG, pending CXR - hold torsemide for now pending BP trend  - wts have been stable, will recheck today   CKD stage IV- h/o failed left BCF (placed 01/31/22).  Azotemia - worsening over last month - baseline sCr 2.8- 3.3 P:  - BUN/ sCR up although UOP 1.2L / 24hrs - if continues will need nephrology input - continue oral bicarb - trend renal indices  - strict I/Os, daily wts - avoid nephrotoxins, renal dose meds, hemodynamic support as above  Urinary retention discharged with foley from last admit Hematuria- gross hematuria likely 2/2 foley trauma exchange 10/31 - hold eliquis.  H/H stable as of this am.  Hematuria reportedly worse since  yesterday.  Repeat H/H this afternoon and monitor for clots or abrupt decrease in UOP.  May need to consult urology.    Severe malnutrition  Severe deconditioning - RD consulted - PMT consulted to assist in ongoing GOC.  Multiple acute on chronic comorbities, worsening CKD, HF, malnutrition, FTT, recurrent admissions  Best Practice (right click and "Reselect all SmartList Selections" daily)   Diet/type: NPO  DVT prophylaxis: DOAC> hold for now GI prophylaxis: H2B Lines: N/A Foley:  Yes, and it is still needed Code Status:  full code Last date of multidisciplinary goals of care discussion [11/2]  - 11/2 Wife, updated by phone, aware of tx to ICU, confirms full code   Labs   CBC: Recent Labs  Lab 03/15/23 0138 03/15/23 0255 03/16/23 0341 03/16/23 1015 03/17/23 0458  WBC 4.4 3.6* 4.6 4.3 5.5  NEUTROABS  --   --  3.0  --   --   HGB 10.8* 13.2 13.1 12.6* 13.8  HCT 34.2* 43.7 41.7 40.3 44.7  MCV 81.0 83.1 81.0  80.4 82.6  PLT 297 205 231 230 282    Basic Metabolic Panel: Recent Labs  Lab 03/15/23 0138 03/15/23 0255 03/15/23 0420 03/16/23 1015 03/17/23 0458  NA 132* 136 134* 136 139  K 4.4 5.8* 3.7 5.2* 5.0  CL 89* 89* 93* 90* 95*  CO2 29 28 28 30 29   GLUCOSE 226* 151* 230* 84 50*  BUN 122* 123* 112* 126* 121*  CREATININE 2.94* 2.75* 2.66* 3.07* 3.18*  CALCIUM 9.2 9.3 10.4* 9.5 9.6  MG  --  3.0*  --   --   --   PHOS  --  6.2*  --  5.4*  --    GFR: Estimated Creatinine Clearance: 19.9 mL/min (A) (by C-G formula based on SCr of 3.18 mg/dL (H)). Recent Labs  Lab 03/15/23 0255 03/15/23 0256 03/16/23 0341 03/16/23 1015 03/17/23 0458  WBC 3.6*  --  4.6 4.3 5.5  LATICACIDVEN  --  4.3*  --   --   --     Liver Function Tests: Recent Labs  Lab 02/13/2023 2215 03/16/23 1015  AST 103*  --   ALT 41  --   ALKPHOS 106  --   BILITOT 2.4*  --   PROT 7.3  --   ALBUMIN 2.8* 2.8*   No results for input(s): "LIPASE", "AMYLASE" in the last 168 hours. Recent Labs  Lab  03/15/23 0256  AMMONIA 53*    ABG    Component Value Date/Time   PHART 7.427 03/15/2023 0101   PCO2ART 49.3 (H) 03/15/2023 0101   PO2ART 422 (H) 03/15/2023 0101   HCO3 32.9 (H) 03/15/2023 0101   TCO2 34 (H) 03/15/2023 0101   ACIDBASEDEF 4.0 (H) 03/24/2022 1428   ACIDBASEDEF 3.0 (H) 03/24/2022 1428   O2SAT 100 03/15/2023 0101     Coagulation Profile: No results for input(s): "INR", "PROTIME" in the last 168 hours.  Cardiac Enzymes: No results for input(s): "CKTOTAL", "CKMB", "CKMBINDEX", "TROPONINI" in the last 168 hours.  HbA1C: Hgb A1c MFr Bld  Date/Time Value Ref Range Status  02/13/2023 08:45 AM 6.1 4.6 - 6.5 % Final    Comment:    Glycemic Control Guidelines for People with Diabetes:Non Diabetic:  <6%Goal of Therapy: <7%Additional Action Suggested:  >8%   09/19/2022 10:03 AM 7.0 (H) 4.6 - 6.5 % Final    Comment:    Glycemic Control Guidelines for People with Diabetes:Non Diabetic:  <6%Goal of Therapy: <7%Additional Action Suggested:  >8%     CBG: Recent Labs  Lab 03/17/23 0632 03/17/23 0639 03/17/23 0642 03/17/23 0645 03/17/23 0758  GLUCAP 22* <10* 35* 99 171*    Critical care time: 45 mins       [name], MSN, AG-ACNP-BC [facility] 03/17/2023, 8:07 AM  See Amion for pager If no response to pager , please call [phone] until 7pm After 7:00 pm call Elink  [phone]

## 2023-03-17 NOTE — Progress Notes (Signed)
PT Cancellation Note  Patient Details Name: Corey [name] MRN: [id number] DOB: 02/18/1946   Cancelled Treatment:    Reason Eval/Treat Not Completed: Medical issues which prohibited therapy Pt transferred to ICU level care.  Will defer PT evaluation today and reassess appropriateness tomorrow.  Olivia Canter 03/17/2023, 10:57 AM

## 2023-03-17 NOTE — Progress Notes (Signed)
Initial Nutrition Assessment  DOCUMENTATION CODES:   Not applicable  INTERVENTION:   -RD will follow for diet advancement and add supplement as appropriate -If within goals of care, may need consider enteral nutrition support:   Initiate Osmolite 1.5 @ 20 ml/hr and increase by 10 ml every 8 hours to goal rate of 50 ml/hr.   60 ml Prosource TF BID    Tube feeding regimen provides 2060 kcal (100% of needs), 115 grams of protein, and 914 ml of H2O.    -If feeds are started, recommend 100 mg thiamine daily x 5 days and monitor Mg, K, and Phos and replete as appropriate due to hgih refeeding risk   NUTRITION DIAGNOSIS:   Increased nutrient needs related to wound healing as evidenced by estimated needs.  GOAL:   Patient will meet greater than or equal to 90% of their needs  MONITOR:   Diet advancement  REASON FOR ASSESSMENT:   Consult Assessment of nutrition requirement/status  ASSESSMENT:   Pt with a history of HFrEF, urinary retention admitted from SNF for increased confusion and low blood sugars.  Pt admitted with acute metabolic encephalopathy.   10/31- s/p BSE- regular consistency diet with thin liquids 11/2- rapid response called secondary to hypotension, tachypenia, hypoxia, and hypoglycemia; placed to bi-pap for increased work of breathing  Reviewed I/O's: -1.8 L x 24 hours and -1.4 L since admission  UOP: 1.3 L x 24 hours   Pt unavailable at time of visit. Attempted to speak with pt via call to hospital room phone, however, unable to reach. RD unable to obtain further nutrition-related history or complete nutrition-focused physical exam at this time.    Pt previously on a regular diet with poor oral intake. Noted meal completions 25-75%. Pt is now NPO.   Reviewed wt hx; pt has experienced  a 6.7% wt loss over the past month, which [place] significant for time frame. Highly suspect malnutrition, however, unable to identify at this time.   Palliative care consult  pending for goals of care discussions; pt may require enteral nutrition support if this aligns with pt's goals of care.   Medications reviewed and include remeron, protonix, sodium bicarbonate, thiamine, demandex, and dextrose 10% solution @ 35 ml/hr.   Lab Results  Component Value Date   HGBA1C 6.1 02/13/2023   PTA DM medications are none.   Labs reviewed: Phos: 5.4, CBGS: 63-171 (inpatient orders for glycemic control are none).    Diet Order:   Diet Order             Diet NPO time specified  Diet effective now                   EDUCATION NEEDS:   No education needs have been identified at this time  Skin:  Skin Assessment: Skin Integrity Issues: Skin Integrity Issues:: Stage II Stage II: medial buttocks  Last BM:  03/16/23 (type 5)  Height:   Ht Readings from Last 1 Encounters:  03/15/23 5\' 6"  (1.676 m)    Weight:   Wt Readings from Last 1 Encounters:  03/16/23 76.9 kg    Ideal Body Weight:  64.5 kg  BMI:  Body mass index is 27.36 kg/m.  Estimated Nutritional Needs:   Kcal:  1900-2100  Protein:  100-115 grams  Fluid:  > 1.9 L    Levada Schilling, RD, LDN, CDCES Registered Dietitian III Certified Diabetes Care and Education Specialist Please refer to [facility] for RD and/or RD on-call/weekend/after hours pager

## 2023-03-17 NOTE — Progress Notes (Addendum)
0745 on arrival patient responds to voice known name place and year unable to communicate any more than that follows commands labored breathing on 5l Bruni Dr in room with night RN apparently RRT was just called and glucose was low and patient was unresponsive. BIPAP and CC eval ordered. CC Dr came to bedside and evaluated patient orders to move patient D50.9 ordered Glucose check on finger was 40 glucose check from right AC was 171. Rectal temp taken unable to get oral or axillary temp. Patient had food all in bed with him and pillows on with no pillowcases. Unable to provide ADL care at this time with patient condition and other orders with assignment. Patient has foley urine bloody Dr aware and states its worse than yesterday. 1015 called ICU to give report since no call was received to give report. RT charge and myself transported patient to 66M room 10 ICU on BIPAP D% running and zosyn unit doesn't have D10 to hang that is what the orders where changed to.

## 2023-03-17 NOTE — Progress Notes (Signed)
OT Cancellation Note  Patient Details Name: Corey [name] MRN: [id number] DOB: 1945/08/19   Cancelled Treatment:     pt. Transferred to icu today.  Reade Trefz 03/17/2023, 11:14 AM

## 2023-03-17 NOTE — Consult Note (Signed)
Cardiology Consultation   Patient ID: Corey [name] MRN: [id number]; DOB: Nov 30, 1945  Admit date: 03/05/2023 Date of Consult: 03/17/2023  PCP:  Pincus Sanes, MD   [facility] Providers Cardiologist:  Donato Schultz, MD  Electrophysiologist:  Maurice Small, MD       Patient Profile:   Corey [name] is a 77 y.o. male with a hx of TTR amyloid, chronic HFrEF, aflutter with slow response with BiV pacemaker, CAD 80% LAD lesion managed medically, past PEA arrest in setting of hypoxic respiratory failure, OSA, CKD IV/V, DM2, chronic hypotension on midodrine.  who is being seen 03/17/2023 for the evaluation of SOB at the request of Dr Randol Kern.  History of Present Illness:   Corey [name] 77 yo male history of TTR amyloid, chronic HFrEF, aflutter with slow response with BiV pacemaker, CAD 80% LAD lesion managed medically, past PEA arrest in setting of hypoxic respiratory failure, OSA, CKD IV/V, DM2, chronic hypotension on midodrine.   Recent admission 10/15 to 03/13/2023 with acute on chronic systolic HF. Diuresed 20 L per di/c note, down 29 lbs. Discharge weight 170 lbs.   Admit 03/15/23 with hypoglycemia and AMS. Issues with hypothermia, bacteriuria, elevated lactic acid. Managed for sepsis by primary team like urinary source, has chronic catheter.   This morning difficult to arouse, diaphoretic with labored breathing. Hypotensive SBP 50. Blood sugar was 22, given amp of D50, bolus of NS. Iniitilaly improvement in mentation and bp's. Ongoing increased work of breathing in the monring, placed on bipap, critical care consulted.  On my evaluation patient is arouseable, not able to get history on bipap.    K 5 Cr 3.18 BUN 121 GFR 20 WBC 5.5 Hgb 13.8 Plt 282 Lactic acid 5.2  VBG 7.2 pCO2 82  CXR low volume, +congestion   09/2020 RHC/LHC: LAD 80%. High filling pressures and not stable for intervention at the time, given renal function repeat cath had not been pursued.  03/2022  RHC: mean PA 31, PCWP 12, CI 2.9 02/28/2023 echo: LVEF 20-25%, indet diastolic fxn, severe RV dysfunction, PASP 42, mild LAE, mild MR   Past Medical History:  Diagnosis Date   Cataract    CHF (congestive heart failure) (HCC)    Chronic heart failure with preserved ejection fraction (HFpEF) (HCC)    Colon polyp 2003   Dr Victorino Dike; F/U was to be 2008( not completed)   Coronary artery disease    CVA (cerebral infarction) 2014   Diabetes mellitus    Diverticulosis 2003   Dyspnea    with exertion   Gout    Hereditary cardiac amyloidosis (HCC)    Hypertension    Myocardial infarction (HCC) 09/19/2012   PEA cardiac arrest in setting of acute respiratory failure/pulmonary edema   OSA (obstructive sleep apnea) 03/27/2018   Pneumonia 09/29/2011   Avelox X 10 days as OP   Prostate cancer (HCC)    Renal insufficiency    Seizures (HCC) 09/19/2012   not treated for seizure disorder; had a seizure after stroke 2014; no seizure since then   Stroke [facility])     Past Surgical History:  Procedure Laterality Date   A-FLUTTER ABLATION N/A 06/23/2022   Procedure: A-FLUTTER ABLATION;  Surgeon: Maurice Small, MD;  Location: [facility] INVASIVE CV LAB;  Service: Cardiovascular;  Laterality: N/A;   A/V FISTULAGRAM Left 04/28/2022   Procedure: A/V Fistulagram;  Surgeon: Chuck Hint, MD;  Location: [facility] INVASIVE CV LAB;  Service: Cardiovascular;  Laterality: Left;  AV FISTULA PLACEMENT Left 01/31/2022   Procedure: LEFT ARM ARTERIOVENOUS (AV) FISTULA CREATION;  Surgeon: Maeola Harman, MD;  Location: [facility] OR;  Service: Vascular;  Laterality: Left;  regional block to left arm   CARDIOVERSION N/A 03/24/2022   Procedure: CARDIOVERSION;  Surgeon: Dorthula Nettles, DO;  Location: [facility] ENDOSCOPY;  Service: Cardiovascular;  Laterality: N/A;   COLONOSCOPY W/ POLYPECTOMY  2003   no F/U (SOC discussed 12/03/12)   INGUINAL HERNIA REPAIR Left 06/16/2021   Procedure: LAPAROSCOPIC, POSSIBLY OPEN LEFT  INGUINAL HERNIA REPAIR WITH MESH;  Surgeon: Quentin Ore, MD;  Location: [facility] ORS;  Service: General;  Laterality: Left;   INSERTION OF MESH N/A 07/16/2017   Procedure: INSERTION OF MESH;  Surgeon: Berna Bue, MD;  Location: [facility] OR;  Service: General;  Laterality: N/A;   PACEMAKER IMPLANT N/A 12/28/2022   Procedure: PACEMAKER IMPLANT;  Surgeon: Maurice Small, MD;  Location: [facility] INVASIVE CV LAB;  Service: Cardiovascular;  Laterality: N/A;   PEG PLACEMENT  2014   10/08/12-12/10/12   PEG TUBE REMOVAL  2014   PERIPHERAL VASCULAR BALLOON ANGIOPLASTY  04/28/2022   Procedure: PERIPHERAL VASCULAR BALLOON ANGIOPLASTY;  Surgeon: Chuck Hint, MD;  Location: [facility] INVASIVE CV LAB;  Service: Cardiovascular;;   PROSTATE BIOPSY     RIGHT HEART CATH N/A 03/24/2022   Procedure: RIGHT HEART CATH;  Surgeon: Dorthula Nettles, DO;  Location: [facility] INVASIVE CV LAB;  Service: Cardiovascular;  Laterality: N/A;   RIGHT/LEFT HEART CATH AND CORONARY ANGIOGRAPHY N/A 09/20/2020   Procedure: RIGHT/LEFT HEART CATH AND CORONARY ANGIOGRAPHY;  Surgeon: Marykay Lex, MD;  Location: [facility] INVASIVE CV LAB;  Service: Cardiovascular;  Laterality: N/A;   SUBQ ICD IMPLANT N/A 09/04/2022   Procedure: SUBQ ICD IMPLANT;  Surgeon: Maurice Small, MD;  Location: [facility] INVASIVE CV LAB;  Service: Cardiovascular;  Laterality: N/A;   TEE WITHOUT CARDIOVERSION N/A 09/27/2020   Procedure: TRANSESOPHAGEAL ECHOCARDIOGRAM (TEE);  Surgeon: Linden Dolin, MD;  Location: [facility] OR;  Service: Thoracic;  Laterality: N/A;   TEE WITHOUT CARDIOVERSION N/A 03/24/2022   Procedure: TRANSESOPHAGEAL ECHOCARDIOGRAM (TEE);  Surgeon: Dorthula Nettles, DO;  Location: [facility] ENDOSCOPY;  Service: Cardiovascular;  Laterality: N/A;   TEE WITHOUT CARDIOVERSION N/A 06/23/2022   Procedure: TRANSESOPHAGEAL ECHOCARDIOGRAM (TEE);  Surgeon: Maurice Small, MD;  Location: [facility] INVASIVE CV LAB;  Service: Cardiovascular;  Laterality: N/A;   TRACHEOSTOMY  2014    09/30/12-10/20/12   VENTRAL HERNIA REPAIR N/A 07/16/2017   Procedure: LAPAROSCOPIC VENTRAL HERNIA REPAIR WITH MESH;  Surgeon: Berna Bue, MD;  Location: [facility] OR;  Service: General;  Laterality: N/A;   wrist aspiration  02/16/2012    monosodium urate crystals; Dr Melvyn Novas     Inpatient Medications: Scheduled Meds:  allopurinol  50 mg Oral Daily   atorvastatin  10 mg Oral Daily   Chlorhexidine Gluconate Cloth  6 each Topical Daily   midodrine  5 mg Oral BID WC   mirtazapine  15 mg Oral QHS   pantoprazole (PROTONIX) IV  40 mg Intravenous Q24H   sodium bicarbonate  650 mg Oral BID   sodium zirconium cyclosilicate  10 g Oral Once   Tafamidis  61 mg Oral Daily   thiamine (VITAMIN B1) injection  100 mg Intravenous Q24H   torsemide  40 mg Oral Daily   Continuous Infusions:  dextrose 35 mL/hr at 03/17/23 1027   piperacillin-tazobactam (ZOSYN)  IV 12.5 mL/hr at 03/17/23 0900   PRN Meds: acetaminophen, dextrose, dextrose, docusate sodium, ipratropium-albuterol, ondansetron (  ZOFRAN) IV, mouth rinse, polyethylene glycol  Allergies:    Allergies  Allergen Reactions   Hydrochlorothiazide Other (See Comments)    Gout , uncontrolled diabetes and renal insufficiency    Social History:   Social History   Socioeconomic History   Marital status: Married    Spouse name: [name]   Number of children: 2   Years of education: 12   Highest education level: 12th grade  Occupational History   Not on file  Tobacco Use   Smoking status: Former    Current packs/day: 0.00    Average packs/day: 0.3 packs/day for 1 year (0.3 ttl pk-yrs)    Types: Cigarettes    Start date: 05/16/1967    Quit date: 05/15/1968    Years since quitting: 54.8   Smokeless tobacco: Never   Tobacco comments:    smoked 1969- 1970, up to 1-2 cigarettes/ day  Vaping Use   Vaping status: Never Used  Substance and Sexual Activity   Alcohol use: No    Alcohol/week: 0.0 standard drinks of alcohol   Drug use: No    Sexual activity: Yes  Other Topics Concern   Not on file  Social History Narrative   Walks three times a week, 2-3 miles, occasional walks on treadmill       Patient is right-handed. He lives with his wife in a one level home. He drinks 1 diet Mt. Dew a day and rarely has coffee. He walks 3 x a week for exercise.   Social Determinants of Health   Financial Resource Strain: Low Risk  (02/08/2023)   Overall Financial Resource Strain (CARDIA)    Difficulty of Paying Living Expenses: Not hard at all  Food Insecurity: No Food Insecurity (03/01/2023)   Hunger Vital Sign    Worried About Running Out of Food in the Last Year: Never true    Ran Out of Food in the Last Year: Never true  Transportation Needs: No Transportation Needs (03/01/2023)   PRAPARE - Administrator, Civil Service (Medical): No    Lack of Transportation (Non-Medical): No  Physical Activity: Insufficiently Active (02/08/2023)   Exercise Vital Sign    Days of Exercise per Week: 2 days    Minutes of Exercise per Session: 20 min  Stress: No Stress Concern Present (02/08/2023)   [employer] of Occupational Health - Occupational Stress Questionnaire    Feeling of Stress : Not at all  Social Connections: Socially Integrated (02/08/2023)   Social Connection and Isolation Panel [NHANES]    Frequency of Communication with Friends and Family: More than three times a week    Frequency of Social Gatherings with Friends and Family: More than three times a week    Attends Religious Services: More than 4 times per year    Active Member of [employer] or Organizations: Yes    Attends Engineer, structural: More than 4 times per year    Marital Status: Married  Catering manager Violence: Not At Risk (03/01/2023)   Humiliation, Afraid, Rape, and Kick questionnaire    Fear of Current or Ex-Partner: No    Emotionally Abused: No    Physically Abused: No    Sexually Abused: No    Family History:    Family History   Problem Relation Age of Onset   Heart failure Mother    Hypertension Mother    Diabetes Mother    Hypertension Father    Prostate cancer Father    Diabetes Father  Cancer Father    Heart failure Brother    Stroke Neg Hx    Heart disease Neg Hx    Colon cancer Neg Hx      ROS:  Please see the history of present illness.   All other ROS reviewed and negative.     Physical Exam/Data:   Vitals:   03/17/23 1041 03/17/23 1045 03/17/23 1100 03/17/23 1115  BP:  (!) 128/103 (!) 133/96 (!) 139/107  Pulse: 60 60 62 66  Resp: (!) 31 (!) 32 (!) 37 (!) 24  Temp:      TempSrc:      SpO2: 100% 100% 100% 100%  Weight:      Height:        Intake/Output Summary (Last 24 hours) at 03/17/2023 1128 Last data filed at 03/17/2023 1042 Gross per 24 hour  Intake 245.82 ml  Output 1800 ml  Net -1554.18 ml      03/16/2023    5:00 AM 03/15/2023    2:00 AM 03/13/2023    3:48 AM  Last 3 Weights  Weight (lbs) 169 lb 8.5 oz 167 lb 15.9 oz 170 lb 1.6 oz  Weight (kg) 76.9 kg 76.2 kg 77.157 kg     Body mass index is 27.36 kg/m.  General:  Well nourished, well developed, in no acute distress HEENT: normal Neck: no JVD Vascular: No carotid bruits; Distal pulses 2+ bilaterally Cardiac:  normal S1, S2; RRR; no murmur  Lungs:  clear to auscultation bilaterally, no wheezing, rhonchi or rales  Abd: soft, nontender, no hepatomegaly  Ext: no edema Musculoskeletal:  No deformities, BUE and BLE strength normal and equal Skin: warm and dry  Neuro:  CNs 2-12 intact, no focal abnormalities noted Psych:  Normal affect     Laboratory Data:  High Sensitivity Troponin:  No results for input(s): "TROPONINIHS" in the last 720 hours.   Chemistry Recent Labs  Lab 03/15/23 0255 03/15/23 0420 03/16/23 1015 03/17/23 0458  NA 136 134* 136 139  K 5.8* 3.7 5.2* 5.0  CL 89* 93* 90* 95*  CO2 28 28 30 29   GLUCOSE 151* 230* 84 50*  BUN 123* 112* 126* 121*  CREATININE 2.75* 2.66* 3.07* 3.18*  CALCIUM  9.3 10.4* 9.5 9.6  MG 3.0*  --   --   --   GFRNONAA 23* 24* 21* 20*  ANIONGAP 19* 13 16* 15    Recent Labs  Lab 03/07/2023 2215 03/16/23 1015 03/17/23 0458  PROT 7.3  --  7.7  ALBUMIN 2.8* 2.8* 2.9*  AST 103*  --  78*  ALT 41  --  37  ALKPHOS 106  --  97  BILITOT 2.4*  --  2.0*   Lipids No results for input(s): "CHOL", "TRIG", "HDL", "LABVLDL", "LDLCALC", "CHOLHDL" in the last 168 hours.  Hematology Recent Labs  Lab 03/16/23 0341 03/16/23 1015 03/17/23 0458  WBC 4.6 4.3 5.5  RBC 5.15 5.01 5.41  HGB 13.1 12.6* 13.8  HCT 41.7 40.3 44.7  MCV 81.0 80.4 82.6  MCH 25.4* 25.1* 25.5*  MCHC 31.4 31.3 30.9  RDW 19.7* 19.4* 19.9*  PLT 231 230 282   Thyroid  Recent Labs  Lab 03/15/23 0138  TSH 3.282  FREET4 1.11    BNP Recent Labs  Lab 02/15/2023 2354 03/15/23 0140 03/16/23 1015  BNP >4,500.0* >4,500.0* 4,107.1*    DDimer No results for input(s): "DDIMER" in the last 168 hours.   Radiology/Studies:  [facility] Chest Port 1 View  Result Date: 03/17/2023  CLINICAL DATA:  Respiratory distress. EXAM: PORTABLE CHEST 1 VIEW COMPARISON:  03/12/2023 FINDINGS: Low volume film. The cardio pericardial silhouette is enlarged. There is pulmonary vascular congestion without overt pulmonary edema. Interval improvement in aeration at the right base with persistent retrocardiac left base collapse/consolidation. Prominent skin folds overlie the right lung. Right-sided permanent pacemaker again noted. Telemetry leads overlie the chest. IMPRESSION: 1. Low volume film with pulmonary vascular congestion. 2. Interval improvement in aeration at the right base with persistent retrocardiac left base collapse/consolidation. Electronically Signed   By: Kennith Center M.D.   On: 03/17/2023 07:58   CT Head Wo Contrast  Result Date: 03/15/2023 CLINICAL DATA:  Altered mental status EXAM: CT HEAD WITHOUT CONTRAST TECHNIQUE: Contiguous axial images were obtained from the base of the skull through the vertex without  intravenous contrast. RADIATION DOSE REDUCTION: This exam was performed according to the departmental dose-optimization program which includes automated exposure control, adjustment of the mA and/or kV according to patient size and/or use of iterative reconstruction technique. COMPARISON:  CT brain 03/23/2022, MRI 01/07/2015 FINDINGS: Brain: No acute territorial infarction, hemorrhage or focal intracranial mass. Small chronic left cerebellar infarct. Mild chronic small vessel ischemic changes of the white matter. Nonenlarged ventricles Vascular: No hyperdense vessels.  Carotid vascular calcification. Skull: No fracture. Extensive dural and tentorial calcifications as noted on prior exams potentially due to metabolic disease. Sinuses/Orbits: No acute finding. Other: Moderate gas incompletely visualized within the right greater than left masticator space. IMPRESSION: 1. No CT evidence for acute intracranial abnormality. 2. Mild chronic small vessel ischemic changes of the white matter. Small chronic left cerebellar infarct. 3. Moderate gas incompletely visualized within the right greater than left masticator space, this is of uncertain source and significance, correlate for any signs or symptoms of infection. Electronically Signed   By: Jasmine Pang M.D.   On: 03/15/2023 00:25   DG Chest Portable 1 View  Result Date: 02/18/2023 CLINICAL DATA:  Shortness of breath EXAM: PORTABLE CHEST 1 VIEW COMPARISON:  02/27/2023 FINDINGS: Cardiomegaly. Bilateral chest wall pacing devices. Pulmonary vascular congestion. Low lung volumes with basilar atelectasis and accentuation of the pulmonary vascularity. Question small bilateral pleural effusions. No pneumothorax. IMPRESSION: Exam is compromised by low lung volumes. Cardiomegaly with pulmonary vascular congestion and possible small bilateral pleural effusions. Electronically Signed   By: Minerva Fester M.D.   On: 03/05/2023 23:38     Assessment and Plan:    1.HFrEF -09/2020 RHC/LHC: LAD 80%. High filling pressures and not stable for intervention at the time, given renal function repeat cath had not been pursued.  -03/2022 RHC: mean PA 31, PCWP 12, CI 2.9 -02/28/2023 echo: LVEF 20-25%, indet diastolic fxn, severe RV dysfunction, PASP 42, mild LAE, mild MR  - CXR chronic congestion, BNP pending - admit earlier this month diuresed 20 L, discharge weight 170 lbs. Most recent weight 169 lbs.  - presented with sepsis. Hypotensive this morning, lactic acid up to 5. Unclear if primarily septic shock or cardiogenic or perhaps combined. BP's responded to IVFs and currently stable - would recommend placement of internal jugular or subclavian to monitor coox and CVP to help further evaluate his cardiac status. Does not appear markedly volume overloaded on exam by exam, weights stable from last discharge.    2. CAD - 09/2020 RHC/LHC: LAD 80%. High filling pressures and not stable for intervention at the time, given renal function repeat cath had not been pursued.   3. Chronic hypotension - on midodrine at home - significant  hypotension this AM SBP reportedly in the 50s, normalized with NS bolus. - MAPS currently 100s   4. CKD IV/V Cr 3.18, GFR 20   5. History aflutter with slow response/BiV pacemaker - EKG shows aflutter, V paced  6.Cardiac amyloid - on tafamadis at home, continue as inpatient.   7.Sepsis - supsected urosepsis, management per primary team.  - normal WBC, has been hypothermic this admission, lactic acid up to 5. Procalcitonin pending.   8.Hematuria - followed by primary team, thought related to UTI and chronic foley - has been on eliquis  9.Hypoglycemia - ongoing issue during admit, has been on D51/2 NS  For questions or updates, please contact [facility] Please consult [email or web address] for contact info under    Signed, Dina Rich, MD  03/17/2023 11:28 AM

## 2023-03-17 NOTE — Plan of Care (Signed)
  Problem: Education: Goal: Knowledge of General Education information will improve Description: Including pain rating scale, medication(s)/side effects and non-pharmacologic comfort measures Outcome: Progressing   Problem: Clinical Measurements: Goal: Respiratory complications will improve Outcome: Not Progressing

## 2023-03-17 NOTE — Progress Notes (Signed)
Patient transported on BIPAP from 5W to 67M. No complications noted. 69M RT notified.

## 2023-03-17 NOTE — Significant Event (Signed)
Rapid Response Event Note   Reason for Call :  Difficult to arouse, diaphoretic  Initial Focused Assessment:  Pt lying in bed with eyes closed. His breathing is labored. He will respond to pain but will not follow commands or speak. Lungs diminished t/o. ABD large, soft. Skin cool, clammy, diaphoretic.  HR-66, BP-51/28, RR-32, SpO2-unable to obtain, 99% on NRB after forehead probe placed.    Interventions:  CBG-22>1 amp D50>99 500cc NS bolus NRB Plan of Care:  Pt now able to open eyes, follow commands, and answer some simple questions. He is still lethargic.  Titrate NRB as SpO2/WOB allow.  Monitor pt closely. Call RRT if further assistance needed.    Event Summary:   MD Notified: Dr. Joneen Roach notified and came to bedside Call Time:0626 Arrival Time:0630 End [id number]  Terrilyn Saver, RN

## 2023-03-18 DIAGNOSIS — Z7189 Other specified counseling: Secondary | ICD-10-CM | POA: Diagnosis not present

## 2023-03-18 DIAGNOSIS — G9341 Metabolic encephalopathy: Secondary | ICD-10-CM | POA: Diagnosis not present

## 2023-03-18 DIAGNOSIS — Z515 Encounter for palliative care: Secondary | ICD-10-CM

## 2023-03-18 DIAGNOSIS — E162 Hypoglycemia, unspecified: Secondary | ICD-10-CM | POA: Diagnosis not present

## 2023-03-18 DIAGNOSIS — I5023 Acute on chronic systolic (congestive) heart failure: Secondary | ICD-10-CM

## 2023-03-18 LAB — BASIC METABOLIC PANEL
Anion gap: 15 (ref 5–15)
Anion gap: 13 (ref 5–15)
BUN: 115 mg/dL — ABNORMAL HIGH (ref 8–23)
BUN: 110 mg/dL — ABNORMAL HIGH (ref 8–23)
CO2: 30 mmol/L (ref 22–32)
CO2: 31 mmol/L (ref 22–32)
Calcium: 9.3 mg/dL (ref 8.9–10.3)
Calcium: 9.4 mg/dL (ref 8.9–10.3)
Chloride: 97 mmol/L — ABNORMAL LOW (ref 98–111)
Chloride: 98 mmol/L (ref 98–111)
Creatinine, Ser: 3.23 mg/dL — ABNORMAL HIGH (ref 0.61–1.24)
Creatinine, Ser: 3.04 mg/dL — ABNORMAL HIGH (ref 0.61–1.24)
GFR, Estimated: 19 mL/min — ABNORMAL LOW (ref >60)
GFR, Estimated: 21 mL/min — ABNORMAL LOW (ref >60)
Glucose, Bld: 111 mg/dL — ABNORMAL HIGH (ref 70–99)
Glucose, Bld: 108 mg/dL — ABNORMAL HIGH (ref 70–99)
Potassium: 4 mmol/L (ref 3.5–5.1)
Potassium: 3.8 mmol/L (ref 3.5–5.1)
Sodium: 142 mmol/L (ref 135–145)
Sodium: 142 mmol/L (ref 135–145)

## 2023-03-18 LAB — CBC
HCT: 37.1 % — ABNORMAL LOW (ref 39.0–52.0)
Hemoglobin: 11.7 g/dL — ABNORMAL LOW (ref 13.0–17.0)
MCH: 25.6 pg — ABNORMAL LOW (ref 26.0–34.0)
MCHC: 31.5 g/dL (ref 30.0–36.0)
MCV: 81.2 fL (ref 80.0–100.0)
Platelets: 209 10*3/uL (ref 150–400)
RBC: 4.57 MIL/uL (ref 4.22–5.81)
RDW: 19.1 % — ABNORMAL HIGH (ref 11.5–15.5)
WBC: 3.7 10*3/uL — ABNORMAL LOW (ref 4.0–10.5)
nRBC: 0 % (ref 0.0–0.2)

## 2023-03-18 LAB — GLUCOSE, CAPILLARY
Glucose-Capillary: 100 mg/dL — ABNORMAL HIGH (ref 70–99)
Glucose-Capillary: 122 mg/dL — ABNORMAL HIGH (ref 70–99)
Glucose-Capillary: 117 mg/dL — ABNORMAL HIGH (ref 70–99)
Glucose-Capillary: 126 mg/dL — ABNORMAL HIGH (ref 70–99)
Glucose-Capillary: 174 mg/dL — ABNORMAL HIGH (ref 70–99)
Glucose-Capillary: 148 mg/dL — ABNORMAL HIGH (ref 70–99)
Glucose-Capillary: 148 mg/dL — ABNORMAL HIGH (ref 70–99)

## 2023-03-18 LAB — MAGNESIUM: Magnesium: 2.5 mg/dL — ABNORMAL HIGH (ref 1.7–2.4)

## 2023-03-18 LAB — PROCALCITONIN: Procalcitonin: 20.39 ng/mL

## 2023-03-18 LAB — PHOSPHORUS: Phosphorus: 4.4 mg/dL (ref 2.5–4.6)

## 2023-03-18 MED ORDER — FUROSEMIDE 10 MG/ML IJ SOLN
60.0000 mg | Freq: Once | INTRAMUSCULAR | Status: AC
  Administered 2023-03-18: 60 mg via INTRAVENOUS
  Filled 2023-03-18: qty 6

## 2023-03-18 MED ORDER — APIXABAN 5 MG PO TABS
5.0000 mg | ORAL_TABLET | Freq: Two times a day (BID) | ORAL | Status: DC
  Administered 2023-03-18 – 2023-03-20 (×5): 5 mg via ORAL
  Filled 2023-03-18 (×5): qty 1

## 2023-03-18 MED ORDER — MELATONIN 3 MG PO TABS
3.0000 mg | ORAL_TABLET | Freq: Every day | ORAL | Status: DC
  Administered 2023-03-18 – 2023-03-21 (×5): 3 mg via ORAL
  Filled 2023-03-18 (×5): qty 1

## 2023-03-18 MED ORDER — ENSURE ENLIVE PO LIQD
237.0000 mL | Freq: Two times a day (BID) | ORAL | Status: DC
  Administered 2023-03-18 – 2023-03-20 (×5): 237 mL via ORAL

## 2023-03-18 NOTE — Progress Notes (Signed)
Rounding Note    Patient Name: Corey [name] Date of Encounter: 03/18/2023  [facility] Cardiologist: Donato Schultz, MD   Subjective   Breathing some improvement today, able to come off bipap  Inpatient Medications    Scheduled Meds:  allopurinol  50 mg Oral Daily   atorvastatin  10 mg Oral Daily   Chlorhexidine Gluconate Cloth  6 each Topical Daily   melatonin  3 mg Oral QHS   midodrine  5 mg Oral BID WC   mirtazapine  15 mg Oral QHS   pantoprazole (PROTONIX) IV  40 mg Intravenous Q24H   sodium bicarbonate  650 mg Oral BID   sodium zirconium cyclosilicate  10 g Oral Once   thiamine (VITAMIN B1) injection  100 mg Intravenous Q24H   Continuous Infusions:  dextrose 35 mL/hr at 03/18/23 0600   piperacillin-tazobactam (ZOSYN)  IV 12.5 mL/hr at 03/18/23 0600   PRN Meds: acetaminophen, dextrose, dextrose, docusate sodium, ipratropium-albuterol, ondansetron (ZOFRAN) IV, mouth rinse, polyethylene glycol   Vital Signs    Vitals:   03/18/23 0400 03/18/23 0500 03/18/23 0600 03/18/23 0736  BP: (!) 112/90 109/81 112/84   Pulse: 63 65 60   Resp: (!) 30 (!) 28 (!) 22   Temp:    97.9 F (36.6 C)  TempSrc:    Oral  SpO2: 100% 100% 98%   Weight:  73.7 kg    Height:        Intake/Output Summary (Last 24 hours) at 03/18/2023 0841 Last data filed at 03/18/2023 0600 Gross per 24 hour  Intake 857.26 ml  Output 3400 ml  Net -2542.74 ml      03/18/2023    5:00 AM 03/16/2023    5:00 AM 03/15/2023    2:00 AM  Last 3 Weights  Weight (lbs) 162 lb 7.7 oz 169 lb 8.5 oz 167 lb 15.9 oz  Weight (kg) 73.7 kg 76.9 kg 76.2 kg      Telemetry    Aflutter V paced - Personally Reviewed  ECG    N/a - Personally Reviewed  Physical Exam   GEN: No acute distress.   Neck: mildly elevated Cardiac: RRR, no murmurs, rubs, or gallops.  Respiratory: crackles bilaterally GI: Soft, nontender, non-distended  MS: No edema; No deformity. Neuro:  Nonfocal  Psych: Normal affect    Labs    High Sensitivity Troponin:  No results for input(s): "TROPONINIHS" in the last 720 hours.   Chemistry Recent Labs  Lab 02/20/2023 2215 02/15/2023 2355 03/15/23 0255 03/15/23 0420 03/16/23 1015 03/17/23 0458 03/17/23 1555 03/17/23 2325  NA 135   < > 136   < > 136 139 141 142  K 5.5*   < > 5.8*   < > 5.2* 5.0 4.2 4.0  CL 88*   < > 89*   < > 90* 95* 95* 97*  CO2 31   < > 28   < > 30 29 26 30   GLUCOSE 119*   < > 151*   < > 84 50* 123* 111*  BUN 127*   < > 123*   < > 126* 121* 121* 115*  CREATININE 2.94*   < > 2.75*   < > 3.07* 3.18* 3.16* 3.23*  CALCIUM 9.4   < > 9.3   < > 9.5 9.6 9.3 9.3  MG  --   --  3.0*  --   --   --   --   --   PROT 7.3  --   --   --   --  7.7  --   --   ALBUMIN 2.8*  --   --   --  2.8* 2.9*  --   --   AST 103*  --   --   --   --  78*  --   --   ALT 41  --   --   --   --  37  --   --   ALKPHOS 106  --   --   --   --  97  --   --   BILITOT 2.4*  --   --   --   --  2.0*  --   --   GFRNONAA 22*   < > 23*   < > 21* 20* 20* 19*  ANIONGAP 16*   < > 19*   < > 16* 15 20* 15   < > = values in this interval not displayed.    Lipids No results for input(s): "CHOL", "TRIG", "HDL", "LABVLDL", "LDLCALC", "CHOLHDL" in the last 168 hours.  Hematology Recent Labs  Lab 03/16/23 0341 03/16/23 1015 03/17/23 0458 03/17/23 1555  WBC 4.6 4.3 5.5  --   RBC 5.15 5.01 5.41  --   HGB 13.1 12.6* 13.8 12.1*  HCT 41.7 40.3 44.7 39.4  MCV 81.0 80.4 82.6  --   MCH 25.4* 25.1* 25.5*  --   MCHC 31.4 31.3 30.9  --   RDW 19.7* 19.4* 19.9*  --   PLT 231 230 282  --    Thyroid  Recent Labs  Lab 03/15/23 0138  TSH 3.282  FREET4 1.11    BNP Recent Labs  Lab 03/15/23 0140 03/16/23 1015 03/17/23 0458  BNP >4,500.0* 4,107.1* >4,500.0*    DDimer No results for input(s): "DDIMER" in the last 168 hours.   Radiology    DG Chest Port 1 View  Result Date: 03/17/2023 CLINICAL DATA:  Respiratory distress. EXAM: PORTABLE CHEST 1 VIEW COMPARISON:  02/14/2023 FINDINGS: Low  volume film. The cardio pericardial silhouette is enlarged. There is pulmonary vascular congestion without overt pulmonary edema. Interval improvement in aeration at the right base with persistent retrocardiac left base collapse/consolidation. Prominent skin folds overlie the right lung. Right-sided permanent pacemaker again noted. Telemetry leads overlie the chest. IMPRESSION: 1. Low volume film with pulmonary vascular congestion. 2. Interval improvement in aeration at the right base with persistent retrocardiac left base collapse/consolidation. Electronically Signed   By: Kennith Center M.D.   On: 03/17/2023 07:58    Cardiac Studies    Patient Profile     Corey [name] is a 77 y.o. male with a hx of TTR amyloid, chronic HFrEF, aflutter with slow response with BiV pacemaker, CAD 80% LAD lesion managed medically, past PEA arrest in setting of hypoxic respiratory failure, OSA, CKD IV/V, DM2, chronic hypotension on midodrine.  who is being seen 03/17/2023 for the evaluation of SOB at the request of Dr Randol Kern.   Assessment & Plan    1.HFrEF -09/2020 RHC/LHC: LAD 80%. High filling pressures and not stable for intervention at the time, given renal function repeat cath had not been pursued.  03/2022 echo: LVEF 30-35% -03/2022 RHC: mean PA 31, PCWP 12, CI 2.9 -02/28/2023 echo: LVEF 20-25%, indet diastolic fxn, severe RV dysfunction, PASP 42, mild LAE, mild MR   - CXR chronic congestion, BNP >4500 - admit earlier this month diuresed 20 L, discharge weight 170 lbs. Wt today 162 lbs unclear accuracy.  - presented with sepsis initially . Hypotensive yesterday  morning, lactic acid up to 5. Unclear if primarily septic shock or cardiogenic or perhaps combined. BP's responded to IVFs, did not require pressors. Has been on midodrine chronically. SOB initially on bipap, now off   -initial plan was central line placement with coox and CVP monitoring. After critical care discussions with family and patient they  do not wish to escalate level of care.  - Lactic acid 4.3-->5.2-->4.2. Procal 21. Likely combined cardiogenic/septic shock  - received IV lasix 80mg  x 1 yesterday, negative 2.3 L.  - redose IV lasix today 60mg  x 1.    2. CAD - 09/2020 RHC/LHC: LAD 80%. High filling pressures and not stable for intervention at the time, given renal function repeat cath had not been pursued.     3. Chronic hypotension - on midodrine at home - significant hypotension this AM SBP reportedly in the 50s, normalized with NS bolus. Lactic acid 4.3-->5.2-->4.2. Procal 21     4. CKD IV/V Cr 3.18, GFR 20     5. History aflutter with slow response/BiV pacemaker - EKG shows aflutter, V paced   6.Cardiac amyloid - on tafamadis at home, continue as inpatient.    7.Sepsis - supsected urosepsis, management per primary team.  - normal WBC, has been hypothermic this admission, lactic acid up to 5.  Lactic acid 4.3-->5.2-->4.2. Procal 21   8.Hematuria - followed by primary team, thought related to UTI and chronic foley - has been on eliquis now on hold   9.Hypoglycemia - ongoing issue during admit, has been on D51/2 NS  10. DNR/DNI From critical care note: t this time, we will continue current medical care but will not escalate if any further decline, no invasive procedures, and now DNR/ DNI. Wife considering transition to full palliation as she doesn't want him to cause him any further suffering.  - I did speak to patient's wife today and explain the severe nonreversible cardiac conditions we are managing at this time.     For questions or updates, please contact [facility] Please consult [email or web address] for contact info under        Signed, Dina Rich, MD  03/18/2023, 8:41 AM

## 2023-03-18 NOTE — Evaluation (Signed)
Occupational Therapy Evaluation Patient Details Name: Corey [name] MRN: [id number] DOB: [birth year] Today's Date: 03/18/2023   History of Present Illness Pt is a 77 y/o male presenting on 10/30 from SNF for confusion and hypoglycemia. Admitted for concern for sepsis. Noted recent admission 10/15-10/29 for decompensated heart failure. Found with chronic hypoxic and hypercapnic respiratory failure, exacerbation of COPD. Transferred to ICU due to AMS and hypotension.  PMH includes: CHF, CVA, DOE, HTN, Gout, MI, OSA, PNA, seizures, DM, prostate cancer.   Clinical Impression   Patient admitted for above and presents with problem list below.  He requires mod assist +2 safety for bed mobility, mod -min assist +2 for transfers and min to total assist +2 for Adls.  Patient is limited by decreased activity tolerance, generalized weakness, and impaired cognition.  He requires cueing to maintain eyes open during session, demonstrates poor problem solving and awareness of needs throughout session.  Based on performance today, believe patient will best benefit from continued OT services acutely and after dc at inpatient setting with <3hrs/day to optimize independence, safety with ADLs and mobility.        If plan is discharge home, recommend the following: A lot of help with bathing/dressing/bathroom;Assistance with cooking/housework;Assist for transportation;Help with stairs or ramp for entrance;A lot of help with walking and/or transfers;Assistance with feeding    Functional Status Assessment  Patient has had a recent decline in their functional status and demonstrates the ability to make significant improvements in function in a reasonable and predictable amount of time.  Equipment Recommendations  Other (comment) (defer)    Recommendations for Other Services       Precautions / Restrictions Precautions Precautions: Fall Precaution Comments: knees can buckle Restrictions Weight Bearing  Restrictions: No      Mobility Bed Mobility Overal bed mobility: Needs Assistance Bed Mobility: Supine to Sit Rolling: Mod assist, +2 for safety/equipment         General bed mobility comments: assist to lift trunk, scoot hips to EOB    Transfers Overall transfer level: Needs assistance Equipment used: Rolling walker (2 wheels), 1 person hand held assist Transfers: Sit to/from Stand, Bed to chair/wheelchair/BSC Sit to Stand: +2 safety/equipment, Mod assist     Step pivot transfers: +2 safety/equipment, Min assist, Mod assist            Balance Overall balance assessment: Needs assistance Sitting-balance support: Feet supported Sitting balance-Leahy Scale: Fair     Standing balance support: Bilateral upper extremity supported, During functional activity Standing balance-Leahy Scale: Poor Standing balance comment: UE support for balance                           ADL either performed or assessed with clinical judgement   ADL Overall ADL's : Needs assistance/impaired     Grooming: Minimal assistance;Sitting           Upper Body Dressing : Moderate assistance;Sitting   Lower Body Dressing: Maximal assistance;+2 for physical assistance;Sit to/from stand   Toilet Transfer: Moderate assistance;+2 for safety/equipment   Toileting- Clothing Manipulation and Hygiene: Total assistance;+2 for physical assistance;Sit to/from stand       Functional mobility during ADLs: Moderate assistance;+2 for safety/equipment       Vision   Vision Assessment?: No apparent visual deficits Additional Comments: pt requires cueing to open eyes during session, preference to keep eyes closed     Perception         Praxis  Pertinent Vitals/Pain Pain Assessment Pain Assessment: Faces Faces Pain Scale: Hurts little more Pain Location: abdomen Pain Descriptors / Indicators: Aching, Grimacing, Guarding Pain Intervention(s): Limited activity within  patient's tolerance, Monitored during session, Repositioned     Extremity/Trunk Assessment Upper Extremity Assessment Upper Extremity Assessment: Generalized weakness;RUE deficits/detail;LUE deficits/detail RUE Deficits / Details: grossly weak, not formally assessed but ROM appears WFL RUE Coordination: decreased fine motor;decreased gross motor LUE Deficits / Details: grossly weak, not formally assessed but ROM appears WFL except arthrtic hand changes LUE Coordination: decreased fine motor   Lower Extremity Assessment Lower Extremity Assessment: Defer to PT evaluation RLE Deficits / Details: AROM WFL though stiffness present throughout esp ankles; generalized weakness noted though occasional buckling in knees when standing but not all the way LLE Deficits / Details: AROM WFL though stiffness present throughout esp ankles; generalized weakness noted though occasional buckling in knees when standing but not all the way   Cervical / Trunk Assessment Cervical / Trunk Assessment: Kyphotic   Communication Communication Communication: Difficulty following commands/understanding;Hearing impairment (needs repeat directions to follow through on commands)   Cognition Arousal: Alert Behavior During Therapy: Flat affect Overall Cognitive Status: Impaired/Different from baseline Area of Impairment: Problem solving, Attention, Following commands, Safety/judgement, Awareness, Memory                   Current Attention Level: Sustained Memory: Decreased short-term memory Following Commands: Follows one step commands inconsistently, Follows one step commands with increased time Safety/Judgement: Decreased awareness of safety, Decreased awareness of deficits Awareness: Intellectual Problem Solving: Slow processing, Decreased initiation, Difficulty sequencing, Requires verbal cues General Comments: flat affect, poor probelm sovling and decreased awareness to needs     General Comments  on RA  initially after transfer as O2 would not reach (100%) then down to 83% so replaced O2 @ 2LPM.  Positive for large black partial liquid and some formed stool. RN aware.    Exercises     Shoulder Instructions      Home Living Family/patient expects to be discharged to:: Skilled nursing facility Living Arrangements: Spouse/significant other;Children Available Help at Discharge: Family;Available 24 hours/day Type of Home: House Home Access: Stairs to enter [employer] of Steps: 1 step in back, 3 in front with rail Entrance Stairs-Rails: Right Home Layout: One level     Bathroom Shower/Tub: Chief Strategy Officer: Standard     Home Equipment: Agricultural consultant (2 wheels)   Additional Comments: Previous hospitalization pt able to ambulate in hallway with RW and CGA/min A (son followed with chair due to several days of knees buckling)  Lives With: Spouse    Prior Functioning/Environment Prior Level of Function : Needs assist             Mobility Comments: was independent prior to last admission; needing min A +2 for hallway ambulation as above ADLs Comments: was independent prior to last admission        OT Problem List: Decreased strength;Decreased activity tolerance;Impaired balance (sitting and/or standing);Decreased coordination;Decreased knowledge of use of DME or AE;Cardiopulmonary status limiting activity;Pain;Impaired UE functional use      OT Treatment/Interventions: Self-care/ADL training;Therapeutic exercise;Energy conservation;DME and/or AE instruction;Therapeutic activities;Patient/family education;Balance training;Cognitive remediation/compensation    OT Goals(Current goals can be found in the care plan section) Acute Rehab OT Goals Patient Stated Goal: none stated Time For Goal Achievement: 04/01/23 Potential to Achieve Goals: Fair  OT Frequency: Min 1X/week    Co-evaluation PT/OT/SLP Co-Evaluation/Treatment: Yes Reason for  Co-Treatment: To address  functional/ADL transfers;Complexity of the patient's impairments (multi-system involvement) PT goals addressed during session: Mobility/safety with mobility;Balance;Proper use of DME OT goals addressed during session: ADL's and self-care      AM-PAC OT "6 Clicks" Daily Activity     Outcome Measure Help from another person eating meals?: A Little Help from another person taking care of personal grooming?: A Little Help from another person toileting, which includes using toliet, bedpan, or urinal?: Total Help from another person bathing (including washing, rinsing, drying)?: A Lot Help from another person to put on and taking off regular upper body clothing?: A Lot Help from another person to put on and taking off regular lower body clothing?: Total 6 Click Score: 12   End of Session Equipment Utilized During Treatment: Gait belt;Rolling walker (2 wheels) Nurse Communication: Mobility status  Activity Tolerance: Patient tolerated treatment well Patient left: in chair;with call bell/phone within reach;with chair alarm set;with family/visitor present  OT Visit Diagnosis: Other abnormalities of gait and mobility (R26.89);History of falling (Z91.81);Repeated falls (R29.6);Muscle weakness (generalized) (M62.81);Pain;Other symptoms and signs involving cognitive function                Time: 1027-2536 OT Time Calculation (min): 37 min Charges:  OT General Charges $OT Visit: 1 Visit OT Evaluation $OT Eval Moderate Complexity: 1 Mod  Barry Brunner, OT Acute Rehabilitation Services Office [phone]   Chancy Milroy 03/18/2023, 2:58 PM

## 2023-03-18 NOTE — Evaluation (Signed)
Physical Therapy Evaluation Patient Details Name: Corey [name] MRN: [id number] DOB: 02-Apr-1946 Today's Date: 03/18/2023  History of Present Illness  Pt is a 77 y/o male presenting on 10/30 from SNF for confusion and hypoglycemia. Admitted for concern for sepsis. Noted recent admission 10/15-10/29 for decompensated heart failure. Found with chronic hypoxic and hypercapnic respiratory failure, exacerbation of COPD. Transferred to ICU due to AMS and hypotension.  PMH includes: CHF, CVA, DOE, HTN, Gout, MI, OSA, PNA, seizures, DM, prostate cancer.  Clinical Impression  Patient presents with decreased mobility due to generalized weakness, decreased cognition and decreased activity tolerance.  Known to this PT from previous hospital stay he was able to ambulate in hallway with RW and min A just prior to d/c.  Patient prior to last stay was independent at home though using some help couple weeks PTA per wife.  Feel he will benefit from continued skilled PT in the acute setting and from follow up inpatient rehab (<3 hours/day) pending further changes in goals of care per pt/family.         If plan is discharge home, recommend the following: Two people to help with walking and/or transfers;Assistance with cooking/housework;Assist for transportation;Supervision due to cognitive status;Help with stairs or ramp for entrance;Two people to help with bathing/dressing/bathroom   Can travel by private vehicle   No    Equipment Recommendations Other (comment) (TBA)  Recommendations for Other Services       Functional Status Assessment Patient has had a recent decline in their functional status and demonstrates the ability to make significant improvements in function in a reasonable and predictable amount of time.     Precautions / Restrictions Precautions Precautions: Fall Precaution Comments: knees can buckle      Mobility  Bed Mobility Overal bed mobility: Needs Assistance Bed Mobility: Supine to  Sit Rolling: Mod assist, +2 for safety/equipment         General bed mobility comments: assist to lift trunk, scoot hips to EOB    Transfers Overall transfer level: Needs assistance Equipment used: Rolling walker (2 wheels), 1 person hand held assist Transfers: Sit to/from Stand, Bed to chair/wheelchair/BSC Sit to Stand: +2 safety/equipment, Mod assist   Step pivot transfers: +2 safety/equipment, Min assist, Mod assist       General transfer comment: stood from EOB and side stepped to [facility], then brought recliner to bedside and pt stand step with HHA with mod A (no walker) and +2 for safety; then noted pt with BM so stood to RW for hygiene with min A for balance and OT assisting for hygiene.  Pt. then requesting to toilet on BSC so brought beside chair and pt stand step to [facility] with RW and min A of 1-2 for safety with mod A still for sit to stand some lifting help and lowering help.    Ambulation/Gait               General Gait Details: NT due to weakness; fecal incontinence  Stairs            Wheelchair Mobility     Tilt Bed    Modified Rankin (Stroke Patients Only)       Balance Overall balance assessment: Needs assistance Sitting-balance support: Feet supported Sitting balance-Leahy Scale: Fair     Standing balance support: Bilateral upper extremity supported, Reliant on assistive device for balance Standing balance-Leahy Scale: Poor Standing balance comment: UE support for balance  Pertinent Vitals/Pain Pain Assessment Pain Assessment: Faces Faces Pain Scale: Hurts little more Pain Location: abdomen Pain Descriptors / Indicators: Aching, Grimacing, Guarding Pain Intervention(s): Monitored during session, Repositioned    Home Living Family/patient expects to be discharged to:: Skilled nursing facility Living Arrangements: Spouse/significant other;Children Available Help at Discharge: Family;Available 24  hours/day Type of Home: House Home Access: Stairs to enter Entrance Stairs-Rails: Right Entrance Stairs-Number of Steps: 1 step in back, 3 in front with rail   Home Layout: One level Home Equipment: Agricultural consultant (2 wheels) Additional Comments: Previous hospitalization pt able to ambulate in hallway with RW and CGA/min A (son followed with chair due to several days of knees buckling)    Prior Function Prior Level of Function : Needs assist             Mobility Comments: was independent prior to last admission; needing min A +2 for hallway ambulation as above       Extremity/Trunk Assessment   Upper Extremity Assessment Upper Extremity Assessment: Defer to OT evaluation    Lower Extremity Assessment Lower Extremity Assessment: RLE deficits/detail;LLE deficits/detail;Generalized weakness RLE Deficits / Details: AROM WFL though stiffness present throughout esp ankles; generalized weakness noted though occasional buckling in knees when standing but not all the way LLE Deficits / Details: AROM WFL though stiffness present throughout esp ankles; generalized weakness noted though occasional buckling in knees when standing but not all the way    Cervical / Trunk Assessment Cervical / Trunk Assessment: Kyphotic  Communication   Communication Communication: Difficulty following commands/understanding;Hearing impairment (needs repeat directions for follow through on commands)  Cognition Arousal: Alert Behavior During Therapy: Flat affect Overall Cognitive Status: Impaired/Different from baseline Area of Impairment: Problem solving, Attention, Following commands                   Current Attention Level: Sustained   Following Commands: Follows one step commands inconsistently, Follows one step commands with increased time     Problem Solving: Slow processing, Decreased initiation, Difficulty sequencing, Requires verbal cues          General Comments General comments  (skin integrity, edema, etc.): on RA initially after transfer as O2 would not reach (100%) then down to 83% so replaced O2 @ 2LPM.  Positive for large black partial liquid and some formed stool. RN aware.    Exercises     Assessment/Plan    PT Assessment Patient needs continued PT services  PT Problem List Decreased activity tolerance;Cardiopulmonary status limiting activity;Decreased mobility;Decreased strength;Decreased balance;Decreased cognition       PT Treatment Interventions DME instruction;Functional mobility training;Balance training;Patient/family education;Therapeutic activities;Gait training;Therapeutic exercise;Cognitive remediation    PT Goals (Current goals can be found in the Care Plan section)  Acute Rehab PT Goals Patient Stated Goal: to get stronger PT Goal Formulation: With patient/family Time For Goal Achievement: 04/01/23 Potential to Achieve Goals: Fair    Frequency Min 1X/week     Co-evaluation PT/OT/SLP Co-Evaluation/Treatment: Yes Reason for Co-Treatment: To address functional/ADL transfers;Complexity of the patient's impairments (multi-system involvement) PT goals addressed during session: Mobility/safety with mobility;Balance;Proper use of DME         AM-PAC PT "6 Clicks" Mobility  Outcome Measure Help needed turning from your back to your side while in a flat bed without using bedrails?: A Lot Help needed moving from lying on your back to sitting on the side of a flat bed without using bedrails?: Total Help needed moving to and from a bed to a  chair (including a wheelchair)?: A Lot Help needed standing up from a chair using your arms (e.g., wheelchair or bedside chair)?: Total Help needed to walk in hospital room?: Total Help needed climbing 3-5 steps with a railing? : Total 6 Click Score: 8    End of Session Equipment Utilized During Treatment: Gait belt Activity Tolerance: Patient limited by fatigue Patient left: in chair;with call  bell/phone within reach;with chair alarm set;with family/visitor present Nurse Communication: Mobility status PT Visit Diagnosis: Other abnormalities of gait and mobility (R26.89);Muscle weakness (generalized) (M62.81);Other symptoms and signs involving the nervous system (R29.898)    Time: 1210-1240 PT Time Calculation (min) (ACUTE ONLY): 30 min   Charges:   PT Evaluation $PT Eval Moderate Complexity: 1 Mod   PT General Charges $$ ACUTE PT VISIT: 1 Visit         [name], PT Acute Rehabilitation Services Office:[phone] 03/18/2023   [name] 03/18/2023, 2:11 PM

## 2023-03-18 NOTE — Progress Notes (Signed)
Palliative Care Consult Note                                  Date: 03/18/2023   Patient Name: Corey [name]  DOB: 09/10/1945  MRN: [id number]  Age / Sex: 77 y.o., male  PCP: Pincus Sanes, MD Referring Physician: Oretha Milch, MD  Reason for Consultation: Establishing goals of care  HPI/Patient Profile: 77 y.o. male  with past medical history of HFrEF, cardiac amyloidosis, DM, CKD IV, HTN, OSA, aflutter with slow response with BiV pacemaker, prior CVA, and chronic hypotension on midodrine  admitted on 02/28/2023 with hypoglycemia and increased confusion.  In ED he had new hematuria. He was hypothermic and there was concern for sepsis. He was admitted to the ICU and started on empiric IV antibiotics. On 11/1 he was transferred to progressive care. On 11/2 he had a rapid response for hypotension, tachypnea, hypoxia and hypoglycemia, for which pressure improved with fluid bolus, and hypoglycemia resolved with D50 push, his increased work of breathing improved with BiPAP support and he was transferred to ICU.    Past Medical History:  Diagnosis Date   Cataract    CHF (congestive heart failure) (HCC)    Chronic heart failure with preserved ejection fraction (HFpEF) (HCC)    Colon polyp 2003   Dr Victorino Dike; F/U was to be 2008( not completed)   Coronary artery disease    CVA (cerebral infarction) 2014   Diabetes mellitus    Diverticulosis 2003   Dyspnea    with exertion   Gout    Hereditary cardiac amyloidosis (HCC)    Hypertension    Myocardial infarction (HCC) 09/19/2012   PEA cardiac arrest in setting of acute respiratory failure/pulmonary edema   OSA (obstructive sleep apnea) 03/27/2018   Pneumonia 09/29/2011   Avelox X 10 days as OP   Prostate cancer (HCC)    Renal insufficiency    Seizures (HCC) 09/19/2012   not treated for seizure disorder; had a seizure after stroke 2014; no seizure since then   Stroke [facility])      Subjective:   I have reviewed medical records including EPIC notes, labs and imaging, received an update from bedside RN, assessed the patient and then met in the patient's room with the patient, his wife, and his sister to discuss diagnosis prognosis, GOC, EOL wishes, disposition and options. Patient only actively participated in conversation for a few minutes  I introduced Palliative Medicine as specialized medical care for people living with serious illness. It focuses on providing relief from symptoms and stress of a serious illness. The goal is to improve quality of life for both the patient and the family.  Today's Discussion: Patient was sitting upright in recliner patient's wife was assisting him with his meal. Patient's sister was also at bedside. Patient's wife has a good understanding of the patient's chronic and acute conditions. I reviewed my understanding, including limitations of ongoing interventions and high risk for further decline despite aggressive treatment efforts. He has had three admissions over the last six months.   His wife shares that the patient has had a decline in function in general but more specifically since his last hospitalization. Prior to his last admission in October he was living at home and was able to complete many ADLs and IADLs independently. However, he would get short of breath or very tired with daily activities. His wife shares  that they are lucky their son lives with them and is able to assist them. She shares her and the patient met at [facility] and have been married 47 years. They have two children and several grandchildren. The patient worked at [employer] before retiring. Since retirement he enjoys watching tv and spending time at home.  Yesterday when speaking to CCM NP, the patient's wife changed the patient's code status to DNR/DNI. His sister confirms she thinks this was a good decision. The patient's wife shares that the patient states he  is "tired." She is considering a transition to comfort measures. I shared what a transition to full comfort care would look like for this patient. The patient would no longer receive aggressive medical interventions such as continuous vital signs, lab work, radiology testing, or medications not focused on comfort. All care would focus on how the patient is looking and feeling. This would include management of any symptoms that may cause discomfort, pain, shortness of breath, cough, nausea, agitation, anxiety, and/or secretions etc. Symptoms would be managed with medications and other non-pharmacological interventions. Family verbalized understanding. Patient's wife and sister would like a couple days for outcomes. If the patient does not have meaningful improvement they plan to transition him to comfort measures as they do not want the patient to suffer. They would be interested in [facility].   Discussed the importance of continued conversation with family and the medical providers regarding overall plan of care and treatment options, ensuring decisions are within the context of the patient's values and GOCs.  Questions and concerns were addressed. Hard Choices booklet left for review. The family was encouraged to call with questions or concerns. PMT will continue to support holistically.  Review of Systems  Unable to perform ROS   Objective:   Primary Diagnoses: Present on Admission:  Acute metabolic encephalopathy   Physical Exam Vitals reviewed.  Constitutional:      Appearance: He is ill-appearing.     Interventions: Nasal cannula in place.  Cardiovascular:     Rate and Rhythm: Normal rate.  Pulmonary:     Effort: Tachypnea present.  Skin:    General: Skin is warm and dry.  Neurological:     Mental Status: He is alert.  Psychiatric:        Behavior: Behavior normal.     Vital Signs:  BP 99/81 (BP Location: Right Arm)   Pulse 65   Temp (!) 97.4 F (36.3 C) (Axillary)    Resp (!) 25   Ht 5\' 6"  (1.676 m)   Wt 73.7 kg   SpO2 99%   BMI 26.22 kg/m     Advanced Care Planning:   Existing Vynca/ACP Documentation: None  Primary Decision Maker: When patient is unable to make decisions his wife Delmus Warwick is his Management consultant.  Code Status/Advance Care Planning: DNR   Assessment & Plan:   SUMMARY OF RECOMMENDATIONS   DNR Limited scope of treatment DNI Time for outcomes- if no meaningful improvement in next couple days will likely transition to full comfort Continued PMT support   Discussed with:  Dr. Vassie Loll and bedside RN Elita Quick  Time Total: 75 minutes  Thank you for allowing [place] to participate in the care of Corey [name] PMT will continue to support holistically.   Signed by: Sarina Ser, NP Palliative Medicine Team  Team Phone # [phone] (Nights/Weekends)

## 2023-03-18 NOTE — Progress Notes (Signed)
NAME:  Corey [name], MRN:  [id number], DOB:  May 23, 1945, LOS: 3 ADMISSION DATE:  03/05/2023, CONSULTATION DATE:  03/15/2023 REFERRING MD:  Nicanor Alcon, EDP CHIEF COMPLAINT:  hypoglycemia   History of Present Illness:  [name] is a 77 year old gentleman with a history of HFrEF, urinary retention with recent admission from 02/27/2023-03/13/2023 for decompensated heart failure.  He was discharged with Foley catheter for urinary retention.  Today at his rehab facility he was more confused than normal with low blood sugars from his fingers.  He was brought back to the hospital today.  He has had a normal blood sugar on his labs but low blood sugars from his fingers.  No blood sugars have been obtained from other sites since.  He denies pain.  Family reports that his cough is baseline and he routinely has an atypical breathing pattern where he breathes fast and slow.  Hematuria is new today.  In the emergency department he was noted to have dropping temperature and due to concern for sepsis was started on empiric IV antibiotics.   Pertinent  Medical History  DM PPM CKD IV Chronic HFrEF, cardiac amyloidosis OSA H/o CVA HTN  Significant Hospital Events: Including procedures, antibiotic start and stop dates in addition to other pertinent events   10/30 brought to ED, admitted for hypoglycemia, hypothermia, concern for sepsis versus decompensated heart failure  10/31 passed SLP 11/1 to [facility] 11/2 re-consult for AMS, hypotension, hypoglycemia, resp distress on BiPAP  Interim History / Subjective:  Feeling better, remains hemodynamically stable off biPAP on 2L, no complaints, wants to eat Remains on D10 gtt Diuresed 3.4L No further hematuria  Objective   Blood pressure 104/89, pulse (!) 58, temperature 97.9 F (36.6 C), temperature source Oral, resp. rate (!) 27, height 5\' 6"  (1.676 m), weight 73.7 kg, SpO2 100%.    FiO2 (%):  [30 %-40 %] 30 %   Intake/Output Summary (Last 24 hours) at  03/18/2023 1022 Last data filed at 03/18/2023 0930 Gross per 24 hour  Intake 793.6 ml  Output 3600 ml  Net -2806.4 ml   Filed Weights   03/15/23 0200 03/16/23 0500 03/18/23 0500  Weight: 76.2 kg 76.9 kg 73.7 kg    Examination: General:  frail and chronically ill appearing elderly male sitting upright in bed in NAD HEENT: MM pink/moist, no obvious JVD w/ EJ, pupils 3/r Neuro: Awake oriented to person, place, generalized weakness, non focal CV: vpaced, aflutter PULM:  non labored on salter 2L [place], clear anteriorly GI: soft, bs+, NT, foley- no further hematuria, clear yellow  Extremities: warm/dry, no pitting LE edema  Skin: no rashes   Labs ordered CBG 93> 122 overnight  Resolved Hospital Problem list   Acute encephalopathy Hyperkalemia  Assessment & Plan:   Acute on chronic hypoxic and hypercapnic respiratory failure Hx COPD - suspect 2/2 atelectasis, deconditioning, and HF - CXR yest with vascular congestion - stable off bipap.  Continue salter HFNC for goal sat > 90% - cont BD - diuresis as below - aggressive pulm hygiene- IS, flutter, PT/ mobilize as tolerated - prn CXR - maximize nutrition - DNI/ DNR - passed bedside swallow> advance as tolerated to heart healthy  Acute encephalopathy, improved - previously thought multifactorial w/concern for sepsis 2/2 UTI, azotemia on top of CKD stage IV, hypoglycemia, HF exacerbation, and underlying cognitive impairment.   P:  - appears at baseline. Maintain neuro protective measures; goal for eurothermia, euglycemia, eunatermia, normoxia - infectious tx as below   Recurrent hypoglycemia -  multifactorial 2/2 to malnutrition and sepsis.  TSH/ cortisol reassuring hx of T2DM on insulin - cont D10 and wean as diet starting today.  RD consulted to help maximize nutrition, ensure added  - SSI and long acting insulin stopped 10/31 - cont thiamine  PAF/ flutter - vpaced  - tele monitoring  - cards following - eliquis restarting  today w/resolution of hematuria  Acute on chronic hypotension Sepsis with possible UTI - chronic indwelling foley, exchanged 10/31 - History of Pseudomonas UTI in 2014.   P:  - cont zosyn for 5 days of therapy - UC insignificant; follow BC - PCT elevated to 21, will recheck today - remains afebrile, pending CBC - MAPs stable, continue midodrine   Hypothermia - cont supportive care  Biventricular HF with history of cardiac amyloidosis Mod PH - echo 02/28/2023 with LVEF 20-25% with severe decreased LV/RV function, global hypokinesis, severe LVH, mildly elevated PASP and RVSP.   - ATTR amyloidosis and is currently on tafamidis outpatient > wife to bring as we do not carry on formula P:  - appreciate cardiology assistance - cont diureses today - pending BMET - strict I/Os - resume tafamadis - overall poor prognosis with cardiac amyloidosis.  PMT consulted 11/2  CKD stage IV- h/o failed left BCF (placed 01/31/22).  Azotemia - worsening over last month - baseline sCr 2.8- 3.3 P:  - pending BMET today and trend - great UOP - oral bicarb  - doubt he would hemodynamically tolerate HD w/ HF and PH - strict I/Os, daily wts - avoid nephrotoxins, renal dose meds, hemodynamic support as above  Urinary retention discharged with foley from last admit Hematuria- gross hematuria likely 2/2 foley trauma exchange 10/31 - H/H stable yest w/ resolution of hematuria.  Resume eliquis and monitor closely  Severe malnutrition  Severe deconditioning - RD consulted - PT/ OT  GOC - PMT consulted to assist given poor prognosis.  Changed to DNR/ DNI 11/2 after discussions with wife w/ plans to continue current medical therapies without escalation if further deterioration and focus on comfort.   Best Practice (right click and "Reselect all SmartList Selections" daily)   Diet/type: NPO  DVT prophylaxis: DOAC> hold for now GI prophylaxis: H2B Lines: N/A Foley:  Yes, and it is still needed Code  Status:  full code Last date of multidisciplinary goals of care discussion [11/2]  - 11/3 Wife updated by phone and plan of care.   Labs   CBC: Recent Labs  Lab 03/15/23 0138 03/15/23 0255 03/16/23 0341 03/16/23 1015 03/17/23 0458 03/17/23 1555  WBC 4.4 3.6* 4.6 4.3 5.5  --   NEUTROABS  --   --  3.0  --   --   --   HGB 10.8* 13.2 13.1 12.6* 13.8 12.1*  HCT 34.2* 43.7 41.7 40.3 44.7 39.4  MCV 81.0 83.1 81.0 80.4 82.6  --   PLT 297 205 231 230 282  --     Basic Metabolic Panel: Recent Labs  Lab 03/15/23 0255 03/15/23 0420 03/16/23 1015 03/17/23 0458 03/17/23 1555 03/17/23 2325  NA 136 134* 136 139 141 142  K 5.8* 3.7 5.2* 5.0 4.2 4.0  CL 89* 93* 90* 95* 95* 97*  CO2 28 28 30 29 26 30   GLUCOSE 151* 230* 84 50* 123* 111*  BUN 123* 112* 126* 121* 121* 115*  CREATININE 2.75* 2.66* 3.07* 3.18* 3.16* 3.23*  CALCIUM 9.3 10.4* 9.5 9.6 9.3 9.3  MG 3.0*  --   --   --   --   --  PHOS 6.2*  --  5.4*  --   --   --    GFR: Estimated Creatinine Clearance: 18.1 mL/min (A) (by C-G formula based on SCr of 3.23 mg/dL (H)). Recent Labs  Lab 03/15/23 0255 03/15/23 0256 03/16/23 0341 03/16/23 1015 03/17/23 0458 03/17/23 1029 03/17/23 1242  PROCALCITON  --   --   --   --   --   --  20.95  WBC 3.6*  --  4.6 4.3 5.5  --   --   LATICACIDVEN  --  4.3*  --   --   --  5.2* 4.2*    Liver Function Tests: Recent Labs  Lab 02/27/2023 2215 03/16/23 1015 03/17/23 0458  AST 103*  --  78*  ALT 41  --  37  ALKPHOS 106  --  97  BILITOT 2.4*  --  2.0*  PROT 7.3  --  7.7  ALBUMIN 2.8* 2.8* 2.9*   No results for input(s): "LIPASE", "AMYLASE" in the last 168 hours. Recent Labs  Lab 03/15/23 0256  AMMONIA 53*    ABG    Component Value Date/Time   PHART 7.427 03/15/2023 0101   PCO2ART 49.3 (H) 03/15/2023 0101   PO2ART 422 (H) 03/15/2023 0101   HCO3 32.0 (H) 03/17/2023 0922   TCO2 34 (H) 03/15/2023 0101   ACIDBASEDEF 4.0 (H) 03/24/2022 1428   ACIDBASEDEF 3.0 (H) 03/24/2022 1428    O2SAT 39.1 03/17/2023 0922     Coagulation Profile: No results for input(s): "INR", "PROTIME" in the last 168 hours.  Cardiac Enzymes: No results for input(s): "CKTOTAL", "CKMB", "CKMBINDEX", "TROPONINI" in the last 168 hours.  HbA1C: Hgb A1c MFr Bld  Date/Time Value Ref Range Status  02/13/2023 08:45 AM 6.1 4.6 - 6.5 % Final    Comment:    Glycemic Control Guidelines for People with Diabetes:Non Diabetic:  <6%Goal of Therapy: <7%Additional Action Suggested:  >8%   09/19/2022 10:03 AM 7.0 (H) 4.6 - 6.5 % Final    Comment:    Glycemic Control Guidelines for People with Diabetes:Non Diabetic:  <6%Goal of Therapy: <7%Additional Action Suggested:  >8%     CBG: Recent Labs  Lab 03/17/23 1538 03/17/23 1921 03/17/23 2304 03/18/23 0317 03/18/23 0728  GLUCAP 149* 95 93 100* 122*    Critical care time: n/a       [name], MSN, AG-ACNP-BC [facility] 03/18/2023, 10:22 AM  See Amion for pager If no response to pager , please call [phone] until 7pm After 7:00 pm call Elink  [phone]

## 2023-03-18 NOTE — Progress Notes (Signed)
Pt requested something to help him sleep. Pola Corn, MD made aware and Melatonin ordered. Pt refused Bipap for remainder of the night. HFNC placed on patient. Team made aware.

## 2023-03-19 DIAGNOSIS — G9341 Metabolic encephalopathy: Secondary | ICD-10-CM | POA: Diagnosis not present

## 2023-03-19 DIAGNOSIS — J9601 Acute respiratory failure with hypoxia: Secondary | ICD-10-CM | POA: Diagnosis not present

## 2023-03-19 DIAGNOSIS — Z7189 Other specified counseling: Secondary | ICD-10-CM | POA: Diagnosis not present

## 2023-03-19 DIAGNOSIS — J9602 Acute respiratory failure with hypercapnia: Secondary | ICD-10-CM | POA: Diagnosis not present

## 2023-03-19 DIAGNOSIS — I5021 Acute systolic (congestive) heart failure: Secondary | ICD-10-CM | POA: Diagnosis not present

## 2023-03-19 LAB — BASIC METABOLIC PANEL
Anion gap: 13 (ref 5–15)
BUN: 105 mg/dL — ABNORMAL HIGH (ref 8–23)
CO2: 28 mmol/L (ref 22–32)
Calcium: 9.1 mg/dL (ref 8.9–10.3)
Chloride: 101 mmol/L (ref 98–111)
Creatinine, Ser: 3.3 mg/dL — ABNORMAL HIGH (ref 0.61–1.24)
GFR, Estimated: 19 mL/min — ABNORMAL LOW (ref >60)
Glucose, Bld: 130 mg/dL — ABNORMAL HIGH (ref 70–99)
Potassium: 4.5 mmol/L (ref 3.5–5.1)
Sodium: 142 mmol/L (ref 135–145)

## 2023-03-19 LAB — CBC
HCT: 38.1 % — ABNORMAL LOW (ref 39.0–52.0)
Hemoglobin: 11.8 g/dL — ABNORMAL LOW (ref 13.0–17.0)
MCH: 25.3 pg — ABNORMAL LOW (ref 26.0–34.0)
MCHC: 31 g/dL (ref 30.0–36.0)
MCV: 81.8 fL (ref 80.0–100.0)
Platelets: 176 10*3/uL (ref 150–400)
RBC: 4.66 MIL/uL (ref 4.22–5.81)
RDW: 19.8 % — ABNORMAL HIGH (ref 11.5–15.5)
WBC: 3.9 10*3/uL — ABNORMAL LOW (ref 4.0–10.5)
nRBC: 0 % (ref 0.0–0.2)

## 2023-03-19 LAB — GLUCOSE, CAPILLARY
Glucose-Capillary: 102 mg/dL — ABNORMAL HIGH (ref 70–99)
Glucose-Capillary: 94 mg/dL (ref 70–99)
Glucose-Capillary: 170 mg/dL — ABNORMAL HIGH (ref 70–99)
Glucose-Capillary: 192 mg/dL — ABNORMAL HIGH (ref 70–99)
Glucose-Capillary: 166 mg/dL — ABNORMAL HIGH (ref 70–99)
Glucose-Capillary: 133 mg/dL — ABNORMAL HIGH (ref 70–99)

## 2023-03-19 MED ORDER — THIAMINE MONONITRATE 100 MG PO TABS
100.0000 mg | ORAL_TABLET | Freq: Every day | ORAL | Status: DC
  Administered 2023-03-19 – 2023-03-20 (×2): 100 mg via ORAL
  Filled 2023-03-19 (×2): qty 1

## 2023-03-19 MED ORDER — TAFAMIDIS 61 MG PO CAPS
61.0000 mg | ORAL_CAPSULE | Freq: Every day | ORAL | Status: DC
  Administered 2023-03-19 – 2023-03-20 (×2): 61 mg via ORAL
  Filled 2023-03-19 (×3): qty 1

## 2023-03-19 MED ORDER — ADULT MULTIVITAMIN W/MINERALS CH
1.0000 | ORAL_TABLET | Freq: Every day | ORAL | Status: DC
  Administered 2023-03-19 – 2023-03-20 (×2): 1 via ORAL
  Filled 2023-03-19 (×2): qty 1

## 2023-03-19 NOTE — Plan of Care (Signed)

## 2023-03-19 NOTE — Progress Notes (Signed)
Rounding Note    Patient Name: Corey [name] Date of Encounter: 03/19/2023   [facility] Cardiologist: Donato Schultz, MD   Subjective  He is resting comfortably  Inpatient Medications    Scheduled Meds:  allopurinol  50 mg Oral Daily   apixaban  5 mg Oral BID   atorvastatin  10 mg Oral Daily   Chlorhexidine Gluconate Cloth  6 each Topical Daily   feeding supplement  237 mL Oral BID BM   melatonin  3 mg Oral QHS   mirtazapine  15 mg Oral QHS   thiamine  100 mg Oral Daily   Continuous Infusions:  dextrose Stopped (03/19/23 0748)   PRN Meds: acetaminophen, dextrose, dextrose, docusate sodium, ipratropium-albuterol, ondansetron (ZOFRAN) IV, mouth rinse, polyethylene glycol   Vital Signs    Vitals:   03/19/23 0600 03/19/23 0700 03/19/23 0753 03/19/23 0800  BP: (!) 110/100 (!) 120/94  (!) 132/120  Pulse: 96 65  80  Resp: (!) 25 (!) 26  (!) 28  Temp:   (!) 97.5 F (36.4 C)   TempSrc:   Axillary   SpO2: 100% 100%  100%  Weight:      Height:        Intake/Output Summary (Last 24 hours) at 03/19/2023 0936 Last data filed at 03/19/2023 0800 Gross per 24 hour  Intake 796.19 ml  Output 2511 ml  Net -1714.81 ml      03/19/2023    5:00 AM 03/18/2023    5:00 AM 03/16/2023    5:00 AM  Last 3 Weights  Weight (lbs) 164 lb 10.9 oz 162 lb 7.7 oz 169 lb 8.5 oz  Weight (kg) 74.7 kg 73.7 kg 76.9 kg      Telemetry    Aflutter V paced - Personally Reviewed  ECG    N/a - Personally Reviewed  Physical Exam   GEN: No acute distress.   Cardiac: RRR, no murmurs, rubs, or gallops.  Respiratory: Normal work of breathing GI: Soft, nontender, non-distended  MS: Chronic leg wounds.  No significant edema Neuro:  Nonfocal  Psych: Normal affect   Labs    High Sensitivity Troponin:  No results for input(s): "TROPONINIHS" in the last 720 hours.    Chemistry Recent Labs  Lab 03/11/2023 2215 02/23/2023 2355 03/15/23 0255 03/15/23 0420 03/16/23 1015 03/17/23 0458  03/17/23 1555 03/17/23 2325 03/18/23 1044 03/19/23 0325  NA 135   < > 136   < > 136 139   < > 142 142 142  K 5.5*   < > 5.8*   < > 5.2* 5.0   < > 4.0 3.8 4.5  CL 88*   < > 89*   < > 90* 95*   < > 97* 98 101  CO2 31   < > 28   < > 30 29   < > 30 31 28   GLUCOSE 119*   < > 151*   < > 84 50*   < > 111* 108* 130*  BUN 127*   < > 123*   < > 126* 121*   < > 115* 110* 105*  CREATININE 2.94*   < > 2.75*   < > 3.07* 3.18*   < > 3.23* 3.04* 3.30*  CALCIUM 9.4   < > 9.3   < > 9.5 9.6   < > 9.3 9.4 9.1  MG  --   --  3.0*  --   --   --   --   --  2.5*  --   PROT 7.3  --   --   --   --  7.7  --   --   --   --   ALBUMIN 2.8*  --   --   --  2.8* 2.9*  --   --   --   --   AST 103*  --   --   --   --  78*  --   --   --   --   ALT 41  --   --   --   --  37  --   --   --   --   ALKPHOS 106  --   --   --   --  97  --   --   --   --   BILITOT 2.4*  --   --   --   --  2.0*  --   --   --   --   GFRNONAA 22*   < > 23*   < > 21* 20*   < > 19* 21* 19*  ANIONGAP 16*   < > 19*   < > 16* 15   < > 15 13 13    < > = values in this interval not displayed.    Lipids No results for input(s): "CHOL", "TRIG", "HDL", "LABVLDL", "LDLCALC", "CHOLHDL" in the last 168 hours.  Hematology Recent Labs  Lab 03/17/23 0458 03/17/23 1555 03/18/23 1044 03/19/23 0325  WBC 5.5  --  3.7* 3.9*  RBC 5.41  --  4.57 4.66  HGB 13.8 12.1* 11.7* 11.8*  HCT 44.7 39.4 37.1* 38.1*  MCV 82.6  --  81.2 81.8  MCH 25.5*  --  25.6* 25.3*  MCHC 30.9  --  31.5 31.0  RDW 19.9*  --  19.1* 19.8*  PLT 282  --  209 176   Thyroid  Recent Labs  Lab 03/15/23 0138  TSH 3.282  FREET4 1.11    BNP Recent Labs  Lab 03/15/23 0140 03/16/23 1015 03/17/23 0458  BNP >4,500.0* 4,107.1* >4,500.0*    DDimer No results for input(s): "DDIMER" in the last 168 hours.   Radiology    No results found.  Cardiac Studies    Patient Profile     ZAYN SELLEY is a 77 y.o. male with a hx of TTR amyloid, chronic HFrEF, aflutter with slow response with  BiV pacemaker, CAD 80% LAD lesion managed medically, past PEA arrest in setting of hypoxic respiratory failure, OSA, CKD IV/V, DM2, chronic hypotension on midodrine.  who was being seen 03/17/2023 for the evaluation of SOB at the request of Dr Randol Kern.  Now critically ill and being followed by palliative care  Assessment & Plan    1.HFrEF -09/2020 RHC/LHC: LAD 80%. High filling pressures and not stable for intervention at the time, given renal function repeat cath had not been pursued.  03/2022 echo: LVEF 30-35% -03/2022 RHC: mean PA 31, PCWP 12, CI 2.9 -02/28/2023 echo: LVEF 20-25%, indet diastolic fxn, severe RV dysfunction, PASP 42, mild LAE, mild MR - CXR chronic congestion, BNP >4500 - admit earlier this month diuresed 20 L, discharge weight 170 lbs. Wt when we saw initially was 162 lbs unclear accuracy.  - presented with sepsis initially . Hypotensive yesterday  morning, lactic acid up to 5. Unclear if primarily septic shock or cardiogenic or perhaps combined. BP's responded to IVFs, did not require pressors. Has been on midodrine chronically. SOB initially on  bipap, now off -initial plan was central line placement with coox and CVP monitoring. After critical care discussions with family and patient they do not wish to escalate level of care.  - Lactic acid 4.3-->5.2-->4.2. Procal 21. Likely combined cardiogenic/septic shock  - received IV lasix 80mg  x 1 11/2, negative 2.3 L.  - redose IV lasix today 60mg  x 1 11/3 -He appears relatively euvolemic on exam.    2. CAD - 09/2020 RHC/LHC: LAD 80%. High filling pressures and not stable for intervention at the time, given renal function repeat cath had not been pursued.     3. Chronic hypotension - on midodrine at home - significant hypotension this AM SBP reportedly in the 50s, normalized with NS bolus. Lactic acid 4.3-->5.2-->4.2. Procal 21     4. CKD IV/V Cr 3.18, GFR 20    5. History aflutter with slow response/BiV  pacemaker - EKG shows aflutter, V paced   6.Cardiac amyloid - on tafamadis at home, continue as inpatient.    7.Sepsis - supsected urosepsis, management per primary team.  - normal WBC, has been hypothermic this admission, lactic acid up to 5.  Lactic acid 4.3-->5.2-->4.2. Procal 21   8.Hematuria - followed by primary team, thought related to UTI and chronic foley - has been on eliquis now on hold   9.Hypoglycemia - ongoing issue during admit, has been on D51/2 NS  10. DNR/DNI From critical care note: t this time, we will continue current medical care but will not escalate if any further decline, no invasive procedures, and now DNR/ DNI. Wife considering transition to full palliation as she doesn't want him to cause him any further suffering.  -Dr. Dominga Ferry spoke to patient's wife today and explain the severe nonreversible cardiac conditions we are managing at this time.   Overall, he has significant multiple comorbidities in the setting of sepsis with a potential cardiogenic shock component.  He has 80% LAD disease but is not amenable for therapy secondary to critical state.  He is currently being evaluated by palliative care.  His family has wished for no more escalation of care.  Cardiology can continue to follow-up peripherally. If nothing changes, cardiology will sign off. Do not hesitate to reach out for further questions   Time Spent Directly with Patient:  I have spent a total of 35 minutes with the patient reviewing hospital notes, telemetry, EKGs, labs and examining the patient as well as establishing an assessment and plan that was discussed personally with the patient.  > 50% of time was spent in direct patient care.   For questions or updates, please contact [facility] Please consult [email or web address] for contact info under      Signed, Maisie Fus, MD  03/19/2023, 9:36 AM

## 2023-03-19 NOTE — Progress Notes (Signed)
Attempted to call patients wife in regards to transfer to 59M, but there was no response.

## 2023-03-19 NOTE — Progress Notes (Signed)
PROGRESS NOTE  Corey [name] WUJ:[id number] DOB: 03-Feb-1946   PCP: Pincus Sanes, MD  Patient is from: Nursing home  DOA: 03/01/2023 LOS: 4  Chief complaints Chief Complaint  Patient presents with   Hypoglycemia     Brief Narrative / Interim history: 77 year old M with PMH of COPD, OSA, HFrEF/BiV HF, cardiac amyloidosis, PAH, CKD-4, paroxysmal A-fib on Eliquis, FTT, chronic hypotension and recent hospitalization from 10/15-10/29 for decompensated heart failure and discharged to SNF with Foley catheter due to urinary retention brought back to ED due to confusion, hypothermia and low blood glucose, and admitted to ICU with concern for sepsis in the setting of possible UTI, hypoglycemia, acute on chronic HFrEF acute encephalopathy and hypothermia.  Patient was treated for acute on chronic hypoxic hypercapnic respiratory failure in the setting of COPD and CHF exacerbation as well as atelectasis also treated for sepsis due to possible UTI, acute encephalopathy and hypoglycemia.  Eventually, CODE STATUS changed to DNR/DNI after goal of care discussion between PCCM and family members.  Plan was to continue current scope of care without escalation if further deterioration, and focus on comfort.  Patient was transferred out of ICU to hospitalist service on 03/19/2023.  He was on 2 L by nasal cannula.  Hemodynamically stable.  Significant labs include Cr 3.3 (about baseline), BUN 105 (improved), WBC 3.9, Hgb 11.8 (stable)   Subjective: Seen and examined earlier this morning.  No major events overnight of this morning.  Patient is awake and alert.  He is oriented to self and place but not time.  Feels cold.  No other complaints.  Denies pain or shortness of breath.  Objective: Vitals:   03/19/23 1000 03/19/23 1100 03/19/23 1136 03/19/23 1200  BP: 109/77 108/70  104/65  Pulse: 60 (!) 59  (!) 55  Resp: (!) 29 (!) 25  (!) 23  Temp:   97.7 F (36.5 C)   TempSrc:   Oral   SpO2: 96% 97%  94%   Weight:      Height:        Examination:  GENERAL: Appears frail.  No apparent distress. HEENT: MMM.  Vision and hearing grossly intact.  NECK: Supple.  No apparent JVD.  RESP:  No IWOB.  Fair aeration bilaterally. CVS:  RRR. Heart sounds normal.  ABD/GI/GU: BS+. Abd soft, NTND.  Foley catheter in place.  Light pink urine.  No blood clot MSK/EXT:  Moves extremities. No apparent deformity. No edema.  SKIN: no apparent skin lesion or wound NEURO: Awake and.  Oriented to self and place.  Follows commands.  No apparent focal neuro deficit. PSYCH: Calm. Normal affect.   Procedures:  None  Microbiology summarized: MRSA PCR screen nonreactive Blood cultures NGTD Urine culture with insignificant growth  Assessment and plan: Principal Problem:   Acute metabolic encephalopathy Active Problems:   Acute systolic heart failure (HCC)   Acute respiratory failure with hypoxia and hypercapnia (HCC)   Pressure injury of skin  Acute on chronic hypoxic and hypercapnic respiratory failure-multifactorial including possible COPD exacerbation, CHF exacerbation, deconditioning and atelectasis.  CXR on 10/30 with cardiomegaly and pulmonary vascular congestion and possible small bilateral pleural effusion.  Repeat CXR on 11/2 with low volume, pulmonary vascular congestion and interval improvement in aeration of right base with persistent retrocardiac left base collapse/consolidation.  Patient was diuresed with IV Lasix and also treated with BiPAP.  Briefly received broad-spectrum antibiotics as well.  Now on 2 L by nasal cannula saturating in upper 90s.  No  respiratory distress.  -Continue weaning of oxygen -Pulmonary toilet -Optimize nutrition -Aspiration precaution -Patient made DNR/DNI on 11/2.  Transition to comfort care if patient deteriorates.  Acute on chronic HFrEF/biventricular HF with history of cardiac amyloidosis/moderate PAH: TTE on 10/16 with LVEF 20-25% with severe decreased LV/RV  function, global hypokinesis, severe LVH, mildly elevated PASP and RVSP.   ATTR amyloidosis and is currently on tafamidis outpatient > wife to bring as we do not carry on formula.  Initial CXR concerning for CHF exacerbation.  BNP > 4500 suggesting poor prognosis.  Intermittently diuresed with IV Lasix.  Appears euvolemic on exam. -Cardiology following-defer diuretics to cardiology -Strict intake and output, daily weight, renal functions and electrolytes -Resume tafamadis when family is able to bring -Overall poor prognosis with cardiac amyloidosis.   -Palliative following.  CKD-4 with uremia- h/o failed left BCF (placed 01/31/22). baseline sCr 2.8- 3.3.  Had excellent urine output after IV Lasix yesterday.  Relatively stable. Recent Labs    02/28/2023 2215 03/15/23 0138 03/15/23 0255 03/15/23 0420 03/16/23 1015 03/17/23 0458 03/17/23 1555 03/17/23 2325 03/18/23 1044 03/19/23 0325  BUN 127* 122* 123* 112* 126* 121* 121* 115* 110* 105*  CREATININE 2.94* 2.94* 2.75* 2.66* 3.07* 3.18* 3.16* 3.23* 3.04* 3.30*  -Likely not a candidate for dialysis. -Maintain normotension -Avoid nephrotoxic meds   CAD: R/LHC in 09/2020 with 80% LAD stenosis.  Repeat cath was not pursued due to renal failure. -Defer to cardiology  Acute encephalopathy: Multifactorial including possible sepsis from UTI, uremia, hypoglycemia, underlying cognitive impairment.  CT head without acute finding.  He is BUN noticing 120s.  Seems to have resolved.  He is currently awake and alert.  Has not focal neurodeficit. -Reorientation and delirium precaution -Minimize sedating medications   IDDM-2 with CKD-4 and recurrent hypoglycemia: TSH and cortisol within normal.  Likely due to poor p.o. intake Recent Labs  Lab 03/18/23 1936 03/18/23 2313 03/19/23 0334 03/19/23 0752 03/19/23 1134  GLUCAP 148* 148* 102* 94 170*  -Continue D10 infusion for now -Liberate diet to optimize nutrition    Paroxysmal atrial  fibrillation/flutter-V paced. -Continue Eliquis   Acute on chronic hypotension: Hypotension seems to have resolved. -Hold midodrine  Sepsis and UTI ruled out.  Patient with chronic Foley.  Foley exchanged on 10/31.  Urine and blood cultures negative.  Completed broad-spectrum antibiotics empirically with cefepime and IV Zosyn on 11/4.  ER show received short course of vancomycin for 3 days.   Hypothermia: Resolved.   Urinary retention discharged with foley from last admit.  Foley catheter exchanged on 10/31. Hematuria- gross hematuria likely 2/2 foley trauma exchange 10/31.  Resolving.  H&H stable. -Continue Foley catheter   Severe malnutrition/severe deconditioning/failure to thrive: CODE STATUS changed to DNR/DNI on 11/12 after discussion between PCCM and patient's wife.  Plan is to transition to comfort care if he deteriorates -Palliative medicine following  Lactic acidosis: Likely due to respiratory failure and hypoperfusion.  No clear source of infection.   Severe malnutrition/increased nutrient needs Body mass index is 26.58 kg/m. Nutrition Problem: Increased nutrient needs Etiology: wound healing Signs/Symptoms: estimated needs Interventions: Refer to RD note for recommendations  Pressure skin injury: POA Pressure Injury 03/15/23 Buttocks Medial Stage 2 -  Partial thickness loss of dermis presenting as a shallow open injury with a red, pink wound bed without slough. stage 2 sacrum (Active)  03/15/23 0215  Location: Buttocks  Location Orientation: Medial  Staging: Stage 2 -  Partial thickness loss of dermis presenting as a shallow open injury  with a red, pink wound bed without slough.  Wound Description (Comments): stage 2 sacrum  Present on Admission: Yes  Dressing Type Foam - Lift dressing to assess site every shift 03/19/23 0800   DVT prophylaxis:  SCDs Start: 03/15/23 0024 apixaban (ELIQUIS) tablet 5 mg  Code Status: DNR/DNI Family Communication: None at  bedside Level of care: Telemetry Medical Status is: Inpatient Remains inpatient appropriate because: Respiratory failure, CHF exacerbation   Final disposition: SNF? Consultants:  Pulmonology Cardiology Palliative medicine  55 minutes with more than 50% spent in reviewing records, counseling patient/family and coordinating care.   Sch Meds:  Scheduled Meds:  allopurinol  50 mg Oral Daily   apixaban  5 mg Oral BID   atorvastatin  10 mg Oral Daily   Chlorhexidine Gluconate Cloth  6 each Topical Daily   feeding supplement  237 mL Oral BID BM   melatonin  3 mg Oral QHS   mirtazapine  15 mg Oral QHS   thiamine  100 mg Oral Daily   Continuous Infusions:  dextrose 20 mL/hr at 03/19/23 1200   PRN Meds:.acetaminophen, dextrose, dextrose, docusate sodium, ipratropium-albuterol, ondansetron (ZOFRAN) IV, mouth rinse, polyethylene glycol  Antimicrobials: Anti-infectives (From admission, onward)    Start     Dose/Rate Route Frequency Ordered Stop   03/16/23 2200  vancomycin (VANCOREADY) IVPB 1250 mg/250 mL  Status:  Discontinued        1,250 mg 166.7 mL/hr over 90 Minutes Intravenous Every 48 hours 03/15/23 0135 03/15/23 0838   03/16/23 0800  piperacillin-tazobactam (ZOSYN) IVPB 3.375 g        3.375 g 12.5 mL/hr over 240 Minutes Intravenous Every 8 hours 03/16/23 0708 03/19/23 0225   03/15/23 1000  cefTRIAXone (ROCEPHIN) 2 g in sodium chloride 0.9 % 100 mL IVPB  Status:  Discontinued        2 g 200 mL/hr over 30 Minutes Intravenous Every 24 hours 03/15/23 0047 03/15/23 0838   03/15/23 0930  ceFEPIme (MAXIPIME) 1 g in sodium chloride 0.9 % 100 mL IVPB  Status:  Discontinued        1 g 200 mL/hr over 30 Minutes Intravenous Every 24 hours 03/15/23 0838 03/16/23 0708   03/15/23 0000  ceFEPIme (MAXIPIME) 2 g in sodium chloride 0.9 % 100 mL IVPB        2 g 200 mL/hr over 30 Minutes Intravenous  Once 03/06/2023 2350 03/15/23 0110   03/15/23 0000  metroNIDAZOLE (FLAGYL) IVPB 500 mg         500 mg 100 mL/hr over 60 Minutes Intravenous  Once 03/07/2023 2350 03/15/23 0132   03/15/23 0000  vancomycin (VANCOCIN) IVPB 1000 mg/200 mL premix  Status:  Discontinued        1,000 mg 200 mL/hr over 60 Minutes Intravenous  Once 03/06/2023 2350 02/26/2023 2354   03/15/23 0000  vancomycin (VANCOREADY) IVPB 1500 mg/300 mL        1,500 mg 150 mL/hr over 120 Minutes Intravenous  Once 02/17/2023 2354 03/15/23 0345        I have personally reviewed the following labs and images: CBC: Recent Labs  Lab 03/16/23 0341 03/16/23 1015 03/17/23 0458 03/17/23 1555 03/18/23 1044 03/19/23 0325  WBC 4.6 4.3 5.5  --  3.7* 3.9*  NEUTROABS 3.0  --   --   --   --   --   HGB 13.1 12.6* 13.8 12.1* 11.7* 11.8*  HCT 41.7 40.3 44.7 39.4 37.1* 38.1*  MCV 81.0 80.4 82.6  --  81.2 81.8  PLT 231 230 282  --  209 176   BMP &GFR Recent Labs  Lab 03/15/23 0255 03/15/23 0420 03/16/23 1015 03/17/23 0458 03/17/23 1555 03/17/23 2325 03/18/23 1044 03/19/23 0325  NA 136   < > 136 139 141 142 142 142  K 5.8*   < > 5.2* 5.0 4.2 4.0 3.8 4.5  CL 89*   < > 90* 95* 95* 97* 98 101  CO2 28   < > 30 29 26 30 31 28   GLUCOSE 151*   < > 84 50* 123* 111* 108* 130*  BUN 123*   < > 126* 121* 121* 115* 110* 105*  CREATININE 2.75*   < > 3.07* 3.18* 3.16* 3.23* 3.04* 3.30*  CALCIUM 9.3   < > 9.5 9.6 9.3 9.3 9.4 9.1  MG 3.0*  --   --   --   --   --  2.5*  --   PHOS 6.2*  --  5.4*  --   --   --  4.4  --    < > = values in this interval not displayed.   Estimated Creatinine Clearance: 17.7 mL/min (A) (by C-G formula based on SCr of 3.3 mg/dL (H)). Liver & Pancreas: Recent Labs  Lab 03/03/2023 2215 03/16/23 1015 03/17/23 0458  AST 103*  --  78*  ALT 41  --  37  ALKPHOS 106  --  97  BILITOT 2.4*  --  2.0*  PROT 7.3  --  7.7  ALBUMIN 2.8* 2.8* 2.9*   No results for input(s): "LIPASE", "AMYLASE" in the last 168 hours. Recent Labs  Lab 03/15/23 0256  AMMONIA 53*   Diabetic: No results for input(s): "HGBA1C" in the  last 72 hours. Recent Labs  Lab 03/18/23 1936 03/18/23 2313 03/19/23 0334 03/19/23 0752 03/19/23 1134  GLUCAP 148* 148* 102* 94 170*   Cardiac Enzymes: No results for input(s): "CKTOTAL", "CKMB", "CKMBINDEX", "TROPONINI" in the last 168 hours. No results for input(s): "PROBNP" in the last 8760 hours. Coagulation Profile: No results for input(s): "INR", "PROTIME" in the last 168 hours. Thyroid Function Tests: No results for input(s): "TSH", "T4TOTAL", "FREET4", "T3FREE", "THYROIDAB" in the last 72 hours. Lipid Profile: No results for input(s): "CHOL", "HDL", "LDLCALC", "TRIG", "CHOLHDL", "LDLDIRECT" in the last 72 hours. Anemia Panel: No results for input(s): "VITAMINB12", "FOLATE", "FERRITIN", "TIBC", "IRON", "RETICCTPCT" in the last 72 hours. Urine analysis:    Component Value Date/Time   COLORURINE RED (A) 03/15/2023 0226   APPEARANCEUR HAZY (A) 03/15/2023 0226   LABSPEC 1.011 03/15/2023 0226   PHURINE 6.0 03/15/2023 0226   GLUCOSEU NEGATIVE 03/15/2023 0226   GLUCOSEU NEGATIVE 07/20/2014 1055   HGBUR MODERATE (A) 03/15/2023 0226   HGBUR negative 08/28/2008 1446   BILIRUBINUR NEGATIVE 03/15/2023 0226   BILIRUBINUR Neg 10/28/2014 0916   KETONESUR NEGATIVE 03/15/2023 0226   PROTEINUR 100 (A) 03/15/2023 0226   UROBILINOGEN 0.2 10/28/2014 0916   UROBILINOGEN 0.2 07/20/2014 1055   NITRITE NEGATIVE 03/15/2023 0226   LEUKOCYTESUR TRACE (A) 03/15/2023 0226   Sepsis Labs: Invalid input(s): "PROCALCITONIN", "LACTICIDVEN"  Microbiology: Recent Results (from the past 240 hour(s))  Blood culture (routine x 2)     Status: None (Preliminary result)   Collection Time: 03/15/23 12:24 AM   Specimen: BLOOD RIGHT ARM  Result Value Ref Range Status   Specimen Description BLOOD RIGHT ARM  Final   Special Requests   Final    BOTTLES DRAWN AEROBIC AND ANAEROBIC Blood Culture adequate volume  Culture   Final    NO GROWTH 3 DAYS Performed at [facility], [address]  [place], [place] [address]    Report Status PENDING  Incomplete  Blood culture (routine x 2)     Status: None (Preliminary result)   Collection Time: 03/15/23  1:38 AM   Specimen: BLOOD  Result Value Ref Range Status   Specimen Description BLOOD LEFT ANTECUBITAL  Final   Special Requests   Final    BOTTLES DRAWN AEROBIC ONLY Blood Culture adequate volume   Culture   Final    NO GROWTH 3 DAYS Performed at [facility], [address], [place], [place] [address]    Report Status PENDING  Incomplete  MRSA Next Gen by PCR, Nasal     Status: None   Collection Time: 03/15/23  2:01 AM   Specimen: Nasal Mucosa; Nasal Swab  Result Value Ref Range Status   MRSA by PCR Next Gen NOT DETECTED NOT DETECTED Final    Comment: (NOTE) The GeneXpert MRSA Assay (FDA approved for NASAL specimens only), is one component of a comprehensive MRSA colonization surveillance program. It is not intended to diagnose MRSA infection nor to guide or monitor treatment for MRSA infections. Test performance is not FDA approved in patients less than 55 years old. Performed at [facility], [address], [place], [place] [address]   Remove and replace urinary cath (placed > 5 days) then obtain urine culture from new indwelling urinary catheter.     Status: Abnormal   Collection Time: 03/15/23  2:26 AM   Specimen: Urine, Catheterized  Result Value Ref Range Status   Specimen Description URINE, CATHETERIZED  Final   Special Requests NONE  Final   Culture (A)  Final    <10,000 COLONIES/mL INSIGNIFICANT GROWTH Performed at [facility], [address], [place], [place] [address]    Report Status 03/16/2023 FINAL  Final    Radiology Studies: No results found.    Aleighna Wojtas T. Ervine Witucki [facility]  If 7PM-7AM, please contact night-coverage [email or web address] 03/19/2023, 12:26 PM

## 2023-03-19 NOTE — Progress Notes (Signed)
Daily Progress Note   Patient Name: Corey [name]       Date: 03/19/2023 DOB: 10-27-1945  Age: 77 y.o. MRN#: [id number] Attending Physician: Almon Hercules, MD Primary Care Physician: Pincus Sanes, MD Admit Date: 02/18/2023  Reason for Consultation/Follow-up: Establishing goals of care  Length of Stay: 4  Current Medications: Scheduled Meds:   allopurinol  50 mg Oral Daily   apixaban  5 mg Oral BID   atorvastatin  10 mg Oral Daily   Chlorhexidine Gluconate Cloth  6 each Topical Daily   feeding supplement  237 mL Oral BID BM   melatonin  3 mg Oral QHS   mirtazapine  15 mg Oral QHS   multivitamin with minerals  1 tablet Oral Daily   Tafamidis  61 mg Oral Daily   thiamine  100 mg Oral Daily    Continuous Infusions:  dextrose 20 mL/hr at 03/19/23 1200    PRN Meds: acetaminophen, dextrose, dextrose, docusate sodium, ipratropium-albuterol, ondansetron (ZOFRAN) IV, mouth rinse, polyethylene glycol  Physical Exam Vitals reviewed.  Constitutional:      Appearance: He is ill-appearing.     Interventions: Nasal cannula in place.  Cardiovascular:     Rate and Rhythm: Bradycardia present.  Pulmonary:     Effort: Tachypnea present.  Skin:    General: Skin is warm and dry.  Neurological:     Mental Status: He is alert.             Vital Signs: BP 104/65 (BP Location: Right Arm)   Pulse (!) 55   Temp 97.7 F (36.5 C) (Oral)   Resp (!) 23   Ht 5\' 6"  (1.676 m)   Wt 74.7 kg   SpO2 94%   BMI 26.58 kg/m  SpO2: SpO2: 94 % O2 Device: O2 Device: Nasal Cannula O2 Flow Rate: O2 Flow Rate (L/min): 2 L/min     Patient Active Problem List   Diagnosis Date Noted   Acute systolic heart failure (HCC) 03/17/2023   Acute respiratory failure with hypoxia and hypercapnia (HCC)  03/17/2023   Pressure injury of skin 03/17/2023   Acute metabolic encephalopathy 03/15/2023   Acute on chronic systolic CHF (congestive heart failure) (HCC) 02/28/2023   Acute renal failure superimposed on stage 4 chronic kidney disease (HCC) 02/28/2023   Elevated d-dimer 02/28/2023  Hypoglycemia 02/28/2023   Right shoulder pain 02/28/2023   Generalized weakness 02/28/2023   Acute heart failure with reduced ejection fraction and diastolic dysfunction (HCC) 12/26/2022   Symptomatic bradycardia 12/26/2022   Atrial flutter (HCC) 07/24/2022   Bilateral low back pain without sciatica 04/10/2022   Acute on chronic congestive heart failure (HCC) 03/25/2022   Acute on chronic combined systolic and diastolic heart failure (HCC) 03/23/2022   Paroxysmal atrial fibrillation (HCC) 01/12/2022   Acute cough 01/05/2022   Bilateral edema of lower extremity 01/05/2022   Leg wound, right, initial encounter 10/24/2021   Weakness of both lower extremities 10/24/2021   Chronic kidney disease (CKD), stage IV (severe) (HCC) 09/29/2021   Pure hypercholesterolemia 07/05/2021   Preoperative cardiovascular examination 04/04/2021   HFrEF (heart failure with reduced ejection fraction) (HCC) 04/03/2021   Bilateral hearing loss 01/04/2021   Bilateral impacted cerumen 01/04/2021   Tympanic membrane perforation, left 01/04/2021   Physical deconditioning 10/16/2020   Hereditary cardiac amyloidosis (HCC) 09/30/2020   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   Progressive angina (HCC) - Atypical; DOE 09/20/2020   CAD (coronary artery disease) 09/20/2020   Soft tissue mass 07/30/2020   Contracture of joint of finger of left hand 07/30/2020   DOE (dyspnea on exertion) 03/01/2019   Mild cognitive impairment 12/05/2018   Muscle twitching 07/23/2018   OSA (obstructive sleep apnea) 03/27/2018   Snores 01/21/2018   Poor balance 01/23/2017   Ankle swelling, left 09/06/2016   Bradycardia 01/28/2016   Weak urinary  stream 12/31/2015   Ventral hernia without obstruction or gangrene 09/15/2015   Prostate cancer (HCC) 07/25/2015   Erectile dysfunction 05/04/2015   Urinary frequency 05/04/2015   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   Abnormal finding on MRI of brain 02/14/2015   Leg wound, right 01/08/2013   PEA (Pulseless electrical activity) (HCC) 12/03/2012   Acute hypoxemic respiratory failure (HCC) 09/30/2012   Non-insulin dependent type 2 diabetes mellitus (HCC) 06/30/2011   Gout 03/05/2007   Essential hypertension 03/05/2007    Palliative Care Assessment & Plan   Patient Profile: 77 y.o. male  with past medical history of HFrEF, cardiac amyloidosis, DM, CKD IV, HTN, OSA, aflutter with slow response with BiV pacemaker, prior CVA, and chronic hypotension on midodrine  admitted on 03/15/2023 with hypoglycemia and increased confusion.  In ED he had new hematuria. He was hypothermic and there was concern for sepsis. He was admitted to the ICU and started on empiric IV antibiotics. On 11/1 he was transferred to progressive care. On 11/2 he had a rapid response for hypotension, tachypnea, hypoxia and hypoglycemia, for which pressure improved with fluid bolus, and hypoglycemia resolved with D50 push, his increased work of breathing improved with BiPAP support and he was transferred to ICU.  Patient now on [facility].  Today's Discussion: Met with patient this morning. He reports he is tired and tired of the hospital. He says he doesn't feel like he is improving. He trusts his wife to help him make important decisions and we agree to talk when she is present.  1430: Met with patient's wife and son at bedside. Patient was sleeping. The family discussed their understanding of his chronic conditions and comorbidities.I reviewed my understanding, including limitations of ongoing interventions and high risk for further decline despite aggressive treatment efforts. He has had three  admissions over the last six months. Both his wife and son share that he has stated he is tired and they can see this. His wife worries  that he will never have the quality of life he desires. She has prayed about it and feels it is time to make him comfortable. We review what a transition to comfort would look like for this patient.  Although the patients wife seems ready to transition him to comfort care, his son shares he needs time to process all that has happened this hospitalization-- we agree to meet again tomorrow to see if they have questions or concerns.   Questions and concerns were addressed. Hard Choices booklet left for son to review. The family was encouraged to call with questions or concerns. PMT will continue to support holistically.  Recommendations/Plan: DNR Limited scope of treatment DNI Time for outcomes- if no meaningful improvement in next couple days will likely transition to full comfort- wife is leaning towards comfort but son wants time to process Continued PMT support   Code Status:    Code Status Orders  (From admission, onward)           Start     Ordered   03/17/23 1401  Do not attempt resuscitation (DNR)- Limited -Do Not Intubate (DNI)  (Code Status)  Continuous       Question Answer Comment  If pulseless and not breathing No CPR or chest compressions.   In Pre-Arrest Conditions (Patient Is Breathing and Has A Pulse) Do not intubate. Provide all appropriate non-invasive medical interventions. Avoid ICU transfer unless indicated or required.   Consent: Discussion documented in EHR or advanced directives reviewed      03/17/23 1400         Extensive chart review has been completed prior to seeing the patient and meeting with his wife and son including labs, vital signs, imaging, progress/consult notes, orders, medications, and available advance directive documents.  Care plan was discussed with bedside RN and Dr. Alanda Slim  Time spent: 60  minutes   Thank you for allowing the Palliative Medicine Team to assist in the care of this patient.   Sherryll Burger, NP  Please contact Palliative Medicine Team phone at [phone] for questions and concerns.

## 2023-03-19 NOTE — Progress Notes (Signed)
Nutrition Follow-up  DOCUMENTATION CODES:   Severe malnutrition in context of chronic illness  INTERVENTION:  Liberalized diet Ensure Plus High Protein po BID, each supplement provides 350 kcal and 20 grams of protein. Mighty Shake TID with meals, each supplement provides 330 kcals and 9 grams of protein Multivitamin   NUTRITION DIAGNOSIS:   Severe Malnutrition related to chronic illness as evidenced by severe fat depletion, severe muscle depletion.    GOAL:   Patient will meet greater than or equal to 90% of their needs    MONITOR:   PO intake, Supplement acceptance, Labs, Weight trends, Skin  REASON FOR ASSESSMENT:   Consult Assessment of nutrition requirement/status  ASSESSMENT:   Pt with a history of HFrEF, urinary retention admitted from SNF for increased confusion and low blood sugars.  Patient setting up with lunch tray in front of him, butter knife on bed, dome on floor. Proximately 1 bite taken out of  meal.  Stated that he has not had any appetite changes although when asked about why he only took one bite he stated because he has not had an appetite.  Stated normal weight 170-175#,  There was no family in room to obtain a better nutritional history from. Stated he like to receive shakes and that chocolate is his favorite flavor. Patient told RD that his butt hole hurt, he did not elaborate or explain what kind of pain it was. Information passed on to RN. Patient would benefit from assistance with meals.  Review of EMR revealed family is thinking about what the goals of care for this patient.  NKFA reported.  There is no benefit to excessive dietary restrictions related advanced age, declined oral intake and increased nutrient needs . Patient would benefit better from liberalized diet to help meet increased nutrient needs and promote better oral intake.   Admit weight: 76.2 kg Current weight: 74.7 kg  Hospital weight history: 03/19/23 0500 74.7 kg 164.68 lbs   03/18/23 0500 73.7 kg 162.48 lbs  03/16/23 0500 76.9 kg 169.53 lbs  03/15/23 0200 76.2 kg       Average Meal Intake:  80% intake x 1 recorded meals  Nutritionally Relevant Medications: Scheduled Meds:  allopurinol  50 mg Oral Daily   apixaban  5 mg Oral BID   feeding supplement  237 mL Oral BID BM   mirtazapine  15 mg Oral QHS   thiamine  100 mg Oral Daily    Continuous Infusions:  dextrose 20 mL/hr at 03/19/23 1200    Labs Reviewed    NUTRITION - FOCUSED PHYSICAL EXAM:  Flowsheet Row Most Recent Value  Orbital Region Severe depletion  Upper Arm Region Severe depletion  Thoracic and Lumbar Region Moderate depletion  Buccal Region Severe depletion  Temple Region Severe depletion  Clavicle Bone Region Severe depletion  Clavicle and Acromion Bone Region Severe depletion  Scapular Bone Region Unable to assess  Dorsal Hand Unable to assess  Patellar Region Severe depletion  Anterior Thigh Region Severe depletion  Posterior Calf Region Severe depletion  Edema (RD Assessment) Mild  Hair Reviewed  Eyes Reviewed  Mouth Reviewed  Skin Reviewed  Nails Reviewed       Diet Order:   Diet Order             Diet regular Room service appropriate? Yes; Fluid consistency: Thin  Diet effective now                   EDUCATION NEEDS:   Not  appropriate for education at this time  Skin:  Skin Assessment: Skin Integrity Issues: Skin Integrity Issues:: Stage II Stage II: Sacrum and buttocks  Last BM:  03/19/23  Height:   Ht Readings from Last 1 Encounters:  03/15/23 5\' 6"  (1.676 m)    Weight:   Wt Readings from Last 1 Encounters:  03/19/23 74.7 kg    Ideal Body Weight:  64.5 kg  BMI:  Body mass index is 26.58 kg/m.  Estimated Nutritional Needs:   Kcal:  1900-2100  Protein:  100-115 grams  Fluid:  > 1.9 L    Jamelle Haring RDN, LDN Clinical Dietitian  RDN pager # available on Amion

## 2023-03-20 DIAGNOSIS — J9601 Acute respiratory failure with hypoxia: Secondary | ICD-10-CM | POA: Diagnosis not present

## 2023-03-20 DIAGNOSIS — G9341 Metabolic encephalopathy: Secondary | ICD-10-CM | POA: Diagnosis not present

## 2023-03-20 DIAGNOSIS — J9602 Acute respiratory failure with hypercapnia: Secondary | ICD-10-CM | POA: Diagnosis not present

## 2023-03-20 DIAGNOSIS — Z515 Encounter for palliative care: Secondary | ICD-10-CM

## 2023-03-20 DIAGNOSIS — Z7189 Other specified counseling: Secondary | ICD-10-CM | POA: Diagnosis not present

## 2023-03-20 DIAGNOSIS — E43 Unspecified severe protein-calorie malnutrition: Secondary | ICD-10-CM | POA: Insufficient documentation

## 2023-03-20 DIAGNOSIS — I5021 Acute systolic (congestive) heart failure: Secondary | ICD-10-CM | POA: Diagnosis not present

## 2023-03-20 LAB — CULTURE, BLOOD (ROUTINE X 2)
Culture: NO GROWTH
Culture: NO GROWTH
Special Requests: ADEQUATE
Special Requests: ADEQUATE

## 2023-03-20 LAB — GLUCOSE, CAPILLARY
Glucose-Capillary: 44 mg/dL — CL (ref 70–99)
Glucose-Capillary: 70 mg/dL (ref 70–99)
Glucose-Capillary: 119 mg/dL — ABNORMAL HIGH (ref 70–99)

## 2023-03-20 MED ORDER — LORAZEPAM 2 MG/ML IJ SOLN
1.0000 mg | INTRAMUSCULAR | Status: DC | PRN
Start: 2023-03-20 — End: 2023-03-22

## 2023-03-20 MED ORDER — POLYVINYL ALCOHOL 1.4 % OP SOLN: 1.0000 [drp] | Freq: Four times a day (QID) | OPHTHALMIC | Status: DC | PRN

## 2023-03-20 MED ORDER — OXYCODONE HCL 5 MG PO TABS: 5.0000 mg | ORAL_TABLET | ORAL | Status: DC | PRN

## 2023-03-20 MED ORDER — LORAZEPAM 2 MG/ML PO CONC: 1.0000 mg | ORAL | Status: DC | PRN

## 2023-03-20 MED ORDER — GLYCOPYRROLATE 1 MG PO TABS
1.0000 mg | ORAL_TABLET | ORAL | Status: DC | PRN
Start: 2023-03-20 — End: 2023-03-22

## 2023-03-20 MED ORDER — BIOTENE DRY MOUTH MT LIQD: 15.0000 mL | OROMUCOSAL | Status: DC | PRN

## 2023-03-20 MED ORDER — GLYCOPYRROLATE 0.2 MG/ML IJ SOLN: 0.2000 mg | INTRAMUSCULAR | Status: DC | PRN

## 2023-03-20 MED ORDER — HYDROMORPHONE HCL 1 MG/ML IJ SOLN
0.5000 mg | INTRAMUSCULAR | Status: DC | PRN
Start: 2023-03-20 — End: 2023-03-22
  Administered 2023-03-21: 0.5 mg via INTRAVENOUS
  Administered 2023-03-22: 1 mg via INTRAVENOUS
  Filled 2023-03-20 (×2): qty 1

## 2023-03-20 MED ORDER — LORAZEPAM 1 MG PO TABS: 1.0000 mg | ORAL_TABLET | ORAL | Status: DC | PRN

## 2023-03-20 NOTE — Care Management Important Message (Signed)
Important Message  Patient Details  Name: LARS JEZIORSKI MRN: [id number] Date of Birth: 1945-09-11   Important Message Given:  Yes - Medicare IM  Patient left prior to IM delivery will mail a copy to the patient home address.   Patrizia Paule 03/20/2023, 2:02 PM

## 2023-03-20 NOTE — Progress Notes (Signed)
[facility] [phone] [facility]  Hospice hospital liaison note  Received request from [facility] for family interest in [facility].   Chart reviewed and [facility] eligibility is pending at this time. Talked with wife by phone and plan to assess at bedside and call wife back to explain services tomorrow morning.    Unfortunately [facility] does not have a bed to offer today. TOC is aware hospital liaison will follow up tomorrow or sooner if room becomes available.   Please do not hesitate to call with any hospice related questions or concerns.   Thank you for the opportunity to participate in this patient's care.  Thea Gist, Charity fundraiser, [facility] Liaison  [phone]

## 2023-03-20 NOTE — Progress Notes (Signed)
   Palliative Medicine Inpatient Follow Up Note HPI: 77 y.o. male with past medical history of HFrEF, cardiac amyloidosis, DM, CKD IV, HTN, OSA, aflutter with slow response with BiV pacemaker, prior CVA, and chronic hypotension on midodrine admitted on 03/07/2023 with hypoglycemia and increased confusion.   The Palliative care team has been asked to get involved to further address goals of care.  Today's Discussion 03/20/2023  *Please note that this is a verbal dictation therefore any spelling or grammatical errors are due to the "Dragon Medical One" system interpretation.  Chart reviewed inclusive of vital signs, progress notes, laboratory results, and diagnostic images.   I met with Channing Mutters at bedside this morning. He is A+O x2-3. He denies pain and says he has some slight shortness of breath.   I met with Ayman and his wife, Elinor Dodge this afternoon. Created space and opportunity for patient to explore thoughts feelings and fears regarding current medical situation. We reviewed Makayla's acute on chronic disease burden inclusive of his HFrEF, TTR amyloid, CAD, DM2, OSA, and CKD IV. We discussed patients ongoing dyspnea for due to his heart failure. We reviewed that we can continue treatment though in the long run patient will likely have recurrent re-hospitalizations for similar reasons.  Discussed chronic disease trajectory. I spoke to [place] and his wife about comfort focused care. We talked about transition to comfort measures in house and what that would entail inclusive of medications to control pain, dyspnea, agitation, nausea, itching, and hiccups.   We discussed stopping all uneccessary measures such as cardiac monitoring, blood draws, needle sticks, and frequent vital signs.   Patient and his wife are agreeable to move towards comfort care and allow Mizraim to have a peaceful passing. His ongoing episodes of hypoglycemia will not longer be treated.   Plan to have [facility] evaluate patient.    Questions and concerns addressed/Palliative Support Provided.   Objective Assessment: Vital Signs Vitals:   03/20/23 0555 03/20/23 0822  BP: (!) 127/92 117/78  Pulse: 63 81  Resp: 18 18  Temp: 98.1 F (36.7 C) 98.2 F (36.8 C)  SpO2: (!) 57% 100%    Intake/Output Summary (Last 24 hours) at 03/20/2023 1439 Last data filed at 03/20/2023 1300 Gross per 24 hour  Intake 720 ml  Output 2951 ml  Net -2231 ml   Last Weight  Most recent update: 03/20/2023  6:35 AM    Weight  74.8 kg (164 lb 14.5 oz)            Gen:  Elderly AA M in moderate distress HEENT: moist mucous membranes CV: Regular rate and rhythm PULM: Breathing on 2LPM Burns, tachypneic ABD: soft/nontender EXT: No edema Neuro: Alert and oriented x2  SUMMARY OF RECOMMENDATIONS   DNAR/DNI  Comfort care  Comfort medications per [facility]  TOC - [facility] referral  Ongoing PMT support  Time Spent: 13 Billing based on MDM: High ______________________________________________________________________________________ Lamarr Lulas Diaz Palliative Medicine Team Team Cell Phone: [phone] Please utilize secure chat with additional questions, if there is no response within 30 minutes please call the above phone number  Palliative Medicine Team providers are available by phone from 7am to 7pm daily and can be reached through the team cell phone.  Should this patient require assistance outside of these hours, please call the patient's attending physician.

## 2023-03-20 NOTE — Progress Notes (Signed)
Physical Therapy Treatment and Discharge Patient Details Name: Corey [name] MRN: [id number] DOB: 1946-03-20 Today's Date: 03/20/2023   History of Present Illness Pt is a 77 y/o male presenting on 10/30 from SNF for confusion and hypoglycemia. Admitted for concern for sepsis. Noted recent admission 10/15-10/29 for decompensated heart failure. Found with chronic hypoxic and hypercapnic respiratory failure, exacerbation of COPD. Transferred to ICU due to AMS and hypotension.  PMH includes: CHF, CVA, DOE, HTN, Gout, MI, OSA, PNA, seizures, DM, prostate cancer.    PT Comments  Message to Palliative Care Team earlier in the day regarding helping patient out of bed to the recliner; hopeful that he would perhaps eat more in a more functional position than in the bed; session focused on functional transfers, patient needed two person heavy mode assist to stand on edge of bed to only walk and take a few pivotal steps to the recliner; From the recliner, practiced using the STEDY standing assist frame to see if it will be useful for assisting back to bed and for other transfers as needed; positions, patient as comfortably as possible in the recliner and set him up for lunch at the end of our session;  Discussed patient status and goals of care with Corey Freeze, NP, with Palliative; at this time, patient wishes to focus on comfort, and PT will sign off as our intervention is not congruent with goals of care at this time    If plan is discharge home, recommend the following: Two people to help with walking and/or transfers;Assistance with cooking/housework;Assist for transportation;Supervision due to cognitive status;Help with stairs or ramp for entrance;Two people to help with bathing/dressing/bathroom   Can travel by private vehicle     No  Equipment Recommendations  Other (comment) (as needed)    Recommendations for Other Services       Precautions / Restrictions Precautions Precautions:  Fall Precaution Comments: knees can buckle     Mobility  Bed Mobility Overal bed mobility: Needs Assistance Bed Mobility: Supine to Sit Rolling: Mod assist, +2 for safety/equipment   Supine to sit: Mod assist, +2 for physical assistance     General bed mobility comments: assist to lift trunk, scoot hips to EOB    Transfers Overall transfer level: Needs assistance Equipment used: Rolling walker (2 wheels), 1 person hand held assist Transfers: Sit to/from Stand, Bed to chair/wheelchair/BSC Sit to Stand: +2 safety/equipment, Mod assist   Step pivot transfers: Mod assist, Max assist, +2 physical assistance       General transfer comment: Stood from EOB with +2 heavy mod assist used RW to supoort self while taking pivot steps bed to recliner; noting knees buckled; stood from recliner with stedy with some success, mod assist of 1    Ambulation/Gait                   Stairs             Wheelchair Mobility     Tilt Bed    Modified Rankin (Stroke Patients Only)       Balance     Sitting balance-Leahy Scale: Fair       Standing balance-Leahy Scale: Poor Standing balance comment: UE support for balance                            Cognition Arousal: Alert Behavior During Therapy: Flat affect Overall Cognitive Status: No family/caregiver present to determine baseline cognitive functioning  Exercises      General Comments General comments (skin integrity, edema, etc.): Had messaged earlier with Palliative Care re: helping pt up to recliner for lunch      Pertinent Vitals/Pain Pain Assessment Pain Assessment: Faces Faces Pain Scale: Hurts a little bit Pain Location: abdomen Pain Descriptors / Indicators: Grimacing Pain Intervention(s): Limited activity within patient's tolerance, Monitored during session    Home Living                          Prior Function             PT Goals (current goals can now be found in the care plan section) Acute Rehab PT Goals Patient Stated Goal: agrees to getting up to recliner to eat PT Goal Formulation:  (DC PT per Goals of [place]) Progress towards PT goals: Not progressing toward goals - comment (PT to sign off)    Frequency    Other (Comment) (signing off)      PT Plan      Co-evaluation              AM-PAC PT "6 Clicks" Mobility   Outcome Measure  Help needed turning from your back to your side while in a flat bed without using bedrails?: A Lot Help needed moving from lying on your back to sitting on the side of a flat bed without using bedrails?: Total Help needed moving to and from a bed to a chair (including a wheelchair)?: A Lot Help needed standing up from a chair using your arms (e.g., wheelchair or bedside chair)?: Total Help needed to walk in hospital room?: Total Help needed climbing 3-5 steps with a railing? : Total 6 Click Score: 8    End of Session Equipment Utilized During Treatment: Gait belt Activity Tolerance: Patient limited by fatigue Patient left: in chair;with call bell/phone within reach;with chair alarm set Nurse Communication: Mobility status PT Visit Diagnosis: Other abnormalities of gait and mobility (R26.89);Muscle weakness (generalized) (M62.81);Other symptoms and signs involving the nervous system (R29.898)     Time: 1247-1310 PT Time Calculation (min) (ACUTE ONLY): 23 min  Charges:    $Therapeutic Activity: 23-37 mins PT General Charges $$ ACUTE PT VISIT: 1 Visit                     Corey [name], PT  Acute Rehabilitation Services Office [phone] Secure Chat welcomed    Corey [name] 03/20/2023, 6:53 PM

## 2023-03-20 NOTE — Progress Notes (Signed)
PROGRESS NOTE  Corey [name]:[id number] DOB: 1945-06-14   PCP: Pincus Sanes, MD  Patient is from: Nursing home  DOA: 02/23/2023 LOS: 5  Chief complaints Chief Complaint  Patient presents with   Hypoglycemia     Brief Narrative / Interim history: 77 year old M with PMH of COPD, OSA, HFrEF/BiV HF, cardiac amyloidosis, PAH, CKD-4, paroxysmal A-fib on Eliquis, FTT, chronic hypotension and recent hospitalization from 10/15-10/29 for decompensated heart failure and discharged to SNF with Foley catheter due to urinary retention brought back to ED due to confusion, hypothermia and low blood glucose, and admitted to ICU with concern for sepsis in the setting of possible UTI, hypoglycemia, acute on chronic HFrEF acute encephalopathy and hypothermia.  Patient was treated for acute on chronic hypoxic hypercapnic respiratory failure in the setting of COPD and CHF exacerbation as well as atelectasis also treated for sepsis due to possible UTI, acute encephalopathy and hypoglycemia.  Eventually, CODE STATUS changed to DNR/DNI after goal of care discussion between PCCM and family members.  Plan was to continue current scope of care without escalation if further deterioration, and focus on comfort.  Patient was transferred out of ICU to hospitalist service on 03/19/2023.  He was on 2 L by nasal cannula.  Hemodynamically stable.  Significant labs include Cr 3.3 (about baseline), BUN 105 (improved), WBC 3.9, Hgb 11.8 (stable)  Patient was transitioned to full comfort care on 03/20/2023.  Family interested in [facility].  TOC consulted.    Subjective: Seen and examined earlier this morning.  Hypoglycemic to 44 this morning despite D10 infusion.  Patient has no complaints but not a great historian.  He is awake and alert but only oriented to self and "hospital".  He denies pain or shortness of breath.  Objective: Vitals:   03/19/23 2046 03/20/23 0500 03/20/23 0555 03/20/23 0822  BP: 116/60  (!) 127/92  117/78  Pulse: (!) 55  63 81  Resp: 17  18 18   Temp: 98.1 F (36.7 C)  98.1 F (36.7 C) 98.2 F (36.8 C)  TempSrc:      SpO2: (!) 84%  (!) 57% 100%  Weight:  74.8 kg    Height:        Examination:  GENERAL: Appears frail.  No apparent distress. HEENT: MMM.  Vision and hearing grossly intact.  NECK: Supple.  No apparent JVD.  RESP:  No IWOB.  Fair aeration bilaterally. CVS:  RRR. Heart sounds normal.  ABD/GI/GU: BS+. Abd soft, NTND.  Foley catheter in place.  Light pink urine.  No blood clot MSK/EXT:  Moves extremities. No apparent deformity. No edema.  SKIN: no apparent skin lesion or wound NEURO: Awake and alert.  Oriented to self and place.  Follows commands.  No apparent focal neuro deficit. PSYCH: Calm. Normal affect.   Procedures:  None  Microbiology summarized: MRSA PCR screen nonreactive Blood cultures NGTD Urine culture with insignificant growth  Assessment and plan: Principal Problem:   Acute metabolic encephalopathy Active Problems:   Acute systolic heart failure (HCC)   Acute respiratory failure with hypoxia and hypercapnia (HCC)   Pressure injury of skin   Protein-calorie malnutrition, severe  End-of-life care: Palliative care met with patient and his wife.  Family decided to proceed with full comfort care.  -Appreciate help by palliative medicine -TOC consulted for referral to [facility]   Acute on chronic hypoxic and hypercapnic respiratory failure-multifactorial including possible COPD exacerbation, CHF exacerbation, deconditioning and atelectasis.  CXR concerning for CHF.  BNP  persistently > 4500.  Treated with BiPAP and IV Lasix.  Brief course of BSA.  Now on 2 L by nasal cannula saturating in upper 90s.  No respiratory distress.  -CODE STATUS changed to DNR/DNI on 11/2. -Now full comfort care.  Acute on chronic HFrEF/biventricular HF with history of cardiac amyloidosis/moderate PAH: TTE on 10/16 with LVEF 20-25% with severe decreased LV/RV  function, global hypokinesis, severe LVH, mildly elevated PASP and RVSP.   ATTR amyloidosis and is currently on tafamidis outpatient > wife to bring as we do not carry on formula.  Initial CXR concerning for CHF exacerbation.  BNP > 4500 suggesting poor prognosis.  Intermittently diuresed with IV Lasix.  Appears euvolemic on exam.  CKD-4 with uremia- h/o failed left BCF (placed 01/31/22). baseline sCr 2.8- 3.3.  Had excellent urine output after IV Lasix yesterday.  Relatively stable. Recent Labs    03/06/2023 2215 03/15/23 0138 03/15/23 0255 03/15/23 0420 03/16/23 1015 03/17/23 0458 03/17/23 1555 03/17/23 2325 03/18/23 1044 03/19/23 0325  BUN 127* 122* 123* 112* 126* 121* 121* 115* 110* 105*  CREATININE 2.94* 2.94* 2.75* 2.66* 3.07* 3.18* 3.16* 3.23* 3.04* 3.30*  -Not a candidate for dialysis.  CAD: R/LHC in 09/2020 with 80% LAD stenosis.  Repeat cath was not pursued due to renal failure.  Acute encephalopathy: Multifactorial including possible sepsis from UTI, uremia, hypoglycemia, underlying cognitive impairment.  CT head without acute finding.  He is BUN noticing 120s.  Seems to have resolved.  He is currently awake and alert.  Has not focal neurodeficit.   IDDM-2 with CKD-4 and recurrent hypoglycemia: TSH and cortisol within normal.  Likely due to poor p.o. intake Recent Labs  Lab 03/19/23 1616 03/19/23 2049 03/20/23 0723 03/20/23 0821 03/20/23 1126  GLUCAP 166* 133* 44* 70 119*   Paroxysmal atrial fibrillation/flutter-V paced. -Continue Eliquis   Acute on chronic hypotension: Hypotension seems to have resolved.  Sepsis and UTI ruled out.  Patient with chronic Foley.  Foley exchanged on 10/31.  Urine and blood cultures negative.  Completed broad-spectrum antibiotics empirically with cefepime and IV Zosyn on 11/4.  ER show received short course of vancomycin for 3 days.   Hypothermia: Resolved.   Urinary retention discharged with foley from last admit.  Foley catheter  exchanged on 10/31. Hematuria- gross hematuria likely 2/2 foley trauma exchange 10/31.  Resolving.  H&H stable. -Continue Foley catheter   Severe malnutrition/severe deconditioning/failure to thrive: CODE STATUS changed to DNR/DNI on 11/12 after discussion between PCCM and patient's wife.  -Transitioned to full comfort care on 11/5.  Lactic acidosis: Likely due to respiratory failure and hypoperfusion.  No clear source of infection.   Severe malnutrition/increased nutrient needs Body mass index is 26.62 kg/m. Nutrition Problem: Severe Malnutrition Etiology: chronic illness Signs/Symptoms: severe fat depletion, severe muscle depletion Interventions: Ensure Enlive (each supplement provides 350kcal and 20 grams of protein), Hormel Shake  Pressure skin injury: POA Pressure Injury 03/15/23 Buttocks Medial Stage 2 -  Partial thickness loss of dermis presenting as a shallow open injury with a red, pink wound bed without slough. stage 2 sacrum (Active)  03/15/23 0215  Location: Buttocks  Location Orientation: Medial  Staging: Stage 2 -  Partial thickness loss of dermis presenting as a shallow open injury with a red, pink wound bed without slough.  Wound Description (Comments): stage 2 sacrum  Present on Admission: Yes  Dressing Type Foam - Lift dressing to assess site every shift 03/19/23 2107   DVT prophylaxis:  Patient is full comfort  care.  Code Status: DNR/DNI Family Communication: None at bedside Level of care: Telemetry Medical Status is: Inpatient Remains inpatient appropriate because: Residential hospice   Final disposition: Residential hospice Consultants:  Pulmonology Cardiology Palliative medicine  55 minutes with more than 50% spent in reviewing records, counseling patient/family and coordinating care.   Sch Meds:  Scheduled Meds:  Chlorhexidine Gluconate Cloth  6 each Topical Daily   feeding supplement  237 mL Oral BID BM   melatonin  3 mg Oral QHS    mirtazapine  15 mg Oral QHS   Continuous Infusions:   PRN Meds:.acetaminophen, antiseptic oral rinse, docusate sodium, glycopyrrolate **OR** glycopyrrolate **OR** glycopyrrolate, HYDROmorphone (DILAUDID) injection, ipratropium-albuterol, LORazepam **OR** LORazepam **OR** LORazepam, ondansetron (ZOFRAN) IV, oxyCODONE, polyvinyl alcohol  Antimicrobials: Anti-infectives (From admission, onward)    Start     Dose/Rate Route Frequency Ordered Stop   03/16/23 2200  vancomycin (VANCOREADY) IVPB 1250 mg/250 mL  Status:  Discontinued        1,250 mg 166.7 mL/hr over 90 Minutes Intravenous Every 48 hours 03/15/23 0135 03/15/23 0838   03/16/23 0800  piperacillin-tazobactam (ZOSYN) IVPB 3.375 g        3.375 g 12.5 mL/hr over 240 Minutes Intravenous Every 8 hours 03/16/23 0708 03/19/23 0225   03/15/23 1000  cefTRIAXone (ROCEPHIN) 2 g in sodium chloride 0.9 % 100 mL IVPB  Status:  Discontinued        2 g 200 mL/hr over 30 Minutes Intravenous Every 24 hours 03/15/23 0047 03/15/23 0838   03/15/23 0930  ceFEPIme (MAXIPIME) 1 g in sodium chloride 0.9 % 100 mL IVPB  Status:  Discontinued        1 g 200 mL/hr over 30 Minutes Intravenous Every 24 hours 03/15/23 0838 03/16/23 0708   03/15/23 0000  ceFEPIme (MAXIPIME) 2 g in sodium chloride 0.9 % 100 mL IVPB        2 g 200 mL/hr over 30 Minutes Intravenous  Once 03/01/2023 2350 03/15/23 0110   03/15/23 0000  metroNIDAZOLE (FLAGYL) IVPB 500 mg        500 mg 100 mL/hr over 60 Minutes Intravenous  Once 02/24/2023 2350 03/15/23 0132   03/15/23 0000  vancomycin (VANCOCIN) IVPB 1000 mg/200 mL premix  Status:  Discontinued        1,000 mg 200 mL/hr over 60 Minutes Intravenous  Once 02/27/2023 2350 03/09/2023 2354   03/15/23 0000  vancomycin (VANCOREADY) IVPB 1500 mg/300 mL        1,500 mg 150 mL/hr over 120 Minutes Intravenous  Once 02/14/2023 2354 03/15/23 0345        I have personally reviewed the following labs and images: CBC: Recent Labs  Lab 03/16/23 0341  03/16/23 1015 03/17/23 0458 03/17/23 1555 03/18/23 1044 03/19/23 0325  WBC 4.6 4.3 5.5  --  3.7* 3.9*  NEUTROABS 3.0  --   --   --   --   --   HGB 13.1 12.6* 13.8 12.1* 11.7* 11.8*  HCT 41.7 40.3 44.7 39.4 37.1* 38.1*  MCV 81.0 80.4 82.6  --  81.2 81.8  PLT 231 230 282  --  209 176   BMP &GFR Recent Labs  Lab 03/15/23 0255 03/15/23 0420 03/16/23 1015 03/17/23 0458 03/17/23 1555 03/17/23 2325 03/18/23 1044 03/19/23 0325  NA 136   < > 136 139 141 142 142 142  K 5.8*   < > 5.2* 5.0 4.2 4.0 3.8 4.5  CL 89*   < > 90* 95* 95* 97* 98  101  CO2 28   < > 30 29 26 30 31 28   GLUCOSE 151*   < > 84 50* 123* 111* 108* 130*  BUN 123*   < > 126* 121* 121* 115* 110* 105*  CREATININE 2.75*   < > 3.07* 3.18* 3.16* 3.23* 3.04* 3.30*  CALCIUM 9.3   < > 9.5 9.6 9.3 9.3 9.4 9.1  MG 3.0*  --   --   --   --   --  2.5*  --   PHOS 6.2*  --  5.4*  --   --   --  4.4  --    < > = values in this interval not displayed.   Estimated Creatinine Clearance: 17.7 mL/min (A) (by C-G formula based on SCr of 3.3 mg/dL (H)). Liver & Pancreas: Recent Labs  Lab 02/13/2023 2215 03/16/23 1015 03/17/23 0458  AST 103*  --  78*  ALT 41  --  37  ALKPHOS 106  --  97  BILITOT 2.4*  --  2.0*  PROT 7.3  --  7.7  ALBUMIN 2.8* 2.8* 2.9*   No results for input(s): "LIPASE", "AMYLASE" in the last 168 hours. Recent Labs  Lab 03/15/23 0256  AMMONIA 53*   Diabetic: No results for input(s): "HGBA1C" in the last 72 hours. Recent Labs  Lab 03/19/23 1616 03/19/23 2049 03/20/23 0723 03/20/23 0821 03/20/23 1126  GLUCAP 166* 133* 44* 70 119*   Cardiac Enzymes: No results for input(s): "CKTOTAL", "CKMB", "CKMBINDEX", "TROPONINI" in the last 168 hours. No results for input(s): "PROBNP" in the last 8760 hours. Coagulation Profile: No results for input(s): "INR", "PROTIME" in the last 168 hours. Thyroid Function Tests: No results for input(s): "TSH", "T4TOTAL", "FREET4", "T3FREE", "THYROIDAB" in the last 72  hours. Lipid Profile: No results for input(s): "CHOL", "HDL", "LDLCALC", "TRIG", "CHOLHDL", "LDLDIRECT" in the last 72 hours. Anemia Panel: No results for input(s): "VITAMINB12", "FOLATE", "FERRITIN", "TIBC", "IRON", "RETICCTPCT" in the last 72 hours. Urine analysis:    Component Value Date/Time   COLORURINE RED (A) 03/15/2023 0226   APPEARANCEUR HAZY (A) 03/15/2023 0226   LABSPEC 1.011 03/15/2023 0226   PHURINE 6.0 03/15/2023 0226   GLUCOSEU NEGATIVE 03/15/2023 0226   GLUCOSEU NEGATIVE 07/20/2014 1055   HGBUR MODERATE (A) 03/15/2023 0226   HGBUR negative 08/28/2008 1446   BILIRUBINUR NEGATIVE 03/15/2023 0226   BILIRUBINUR Neg 10/28/2014 0916   KETONESUR NEGATIVE 03/15/2023 0226   PROTEINUR 100 (A) 03/15/2023 0226   UROBILINOGEN 0.2 10/28/2014 0916   UROBILINOGEN 0.2 07/20/2014 1055   NITRITE NEGATIVE 03/15/2023 0226   LEUKOCYTESUR TRACE (A) 03/15/2023 0226   Sepsis Labs: Invalid input(s): "PROCALCITONIN", "LACTICIDVEN"  Microbiology: Recent Results (from the past 240 hour(s))  Blood culture (routine x 2)     Status: None (Preliminary result)   Collection Time: 03/15/23 12:24 AM   Specimen: BLOOD RIGHT ARM  Result Value Ref Range Status   Specimen Description BLOOD RIGHT ARM  Final   Special Requests   Final    BOTTLES DRAWN AEROBIC AND ANAEROBIC Blood Culture adequate volume   Culture   Final    NO GROWTH 4 DAYS Performed at [facility], [address] [place], [place] [address]    Report Status PENDING  Incomplete  Blood culture (routine x 2)     Status: None (Preliminary result)   Collection Time: 03/15/23  1:38 AM   Specimen: BLOOD  Result Value Ref Range Status   Specimen Description BLOOD LEFT ANTECUBITAL  Final  Special Requests   Final    BOTTLES DRAWN AEROBIC ONLY Blood Culture adequate volume   Culture   Final    NO GROWTH 4 DAYS Performed at [facility], [address], [place], [place] [address]    Report Status PENDING  Incomplete   MRSA Next Gen by PCR, Nasal     Status: None   Collection Time: 03/15/23  2:01 AM   Specimen: Nasal Mucosa; Nasal Swab  Result Value Ref Range Status   MRSA by PCR Next Gen NOT DETECTED NOT DETECTED Final    Comment: (NOTE) The GeneXpert MRSA Assay (FDA approved for NASAL specimens only), is one component of a comprehensive MRSA colonization surveillance program. It is not intended to diagnose MRSA infection nor to guide or monitor treatment for MRSA infections. Test performance is not FDA approved in patients less than 2 years old. Performed at [facility], [address], [place], [place] [address]   Remove and replace urinary cath (placed > 5 days) then obtain urine culture from new indwelling urinary catheter.     Status: Abnormal   Collection Time: 03/15/23  2:26 AM   Specimen: Urine, Catheterized  Result Value Ref Range Status   Specimen Description URINE, CATHETERIZED  Final   Special Requests NONE  Final   Culture (A)  Final    <10,000 COLONIES/mL INSIGNIFICANT GROWTH Performed at [facility], [address], [place], [place] [address]    Report Status 03/16/2023 FINAL  Final    Radiology Studies: No results found.    Treylen Gibbs T. Hal Norrington [facility]  If 7PM-7AM, please contact night-coverage [email or web address] 03/20/2023, 2:24 PM

## 2023-03-20 NOTE — TOC Progression Note (Signed)
Transition of Care [facility]) - Progression Note    Patient Details  Name: Corey [name] MRN: [id number] Date of Birth: [birth year]  Transition of Care [facility]) CM/SW Contact  Marinda Tyer A [place], [place] Phone Number: 03/20/2023, 3:53 PM  Clinical Narrative:     CSW was contacted by Thea Gist at [facility] via secure messaging regarding requested evaluation by pt and pt's wife for [facility].  She said that family was now interested in comfort measures and would review for appropriateness at facility. Facility at [facility] notified of update to disposition.  TOC will continue to follow.         Expected Discharge Plan and Services                                               Social Determinants of Health (SDOH) Interventions SDOH Screenings   Food Insecurity: No Food Insecurity (03/01/2023)  Housing: Low Risk  (03/01/2023)  Transportation Needs: No Transportation Needs (03/01/2023)  Utilities: Not At Risk (03/01/2023)  Alcohol Screen: Low Risk  (02/08/2023)  Depression (PHQ2-9): Low Risk  (02/08/2023)  Financial Resource Strain: Low Risk  (02/08/2023)  Physical Activity: Insufficiently Active (02/08/2023)  Social Connections: Socially Integrated (02/08/2023)  Stress: No Stress Concern Present (02/08/2023)  Tobacco Use: Medium Risk (02/27/2023)  Health Literacy: Adequate Health Literacy (02/08/2023)    Readmission Risk Interventions    09/30/2020    2:29 PM  Readmission Risk Prevention Plan  Transportation Screening Complete  PCP or Specialist Appt within 5-7 Days Complete  Home Care Screening Complete  Medication Review (RN CM) Complete

## 2023-03-20 NOTE — Consult Note (Signed)
[facility] Liaison Note   03/20/2023  Corey [name] Mar 31, 1946 [id number]  [facility] [VBCI]   Primary Care Provider: Pincus Sanes, MD [place] at [facility], this provider is listed for the transition of care follow up appointments  and calls   [facility] Liaison met patient at bedside at [facility]. Came to hospital room and family member at the door in conference with Palliative care.  Patient up in chair eating.  Will continue to follow    The patient was screened for 7 day readmission hospitalization with noted extreme risk score for unplanned readmission risk 3 hospital admissions in 6 months.  The patient was assessed for potential Community Care Coordination service needs for post hospital transition for care coordination.    Plan: [facility] Liaison will continue to follow progress and disposition to asess for post hospital community care coordination/management needs.  Referral request for community care coordination: disposition and needs are currently not known for post hospital care.   VBCI Community Care Management/Population Health does not replace or interfere with any arrangements made by the Inpatient Transition of Care team.   For questions contact:   Charlesetta Shanks, RN, BSN, CCM [place]  [facility], [facility] Liaison Direct Dial: [phone] or secure chat Email: [name]

## 2023-03-20 NOTE — Progress Notes (Signed)
Physical Therapy Note  PT Session complete with full note to follow;  Have been in communication with Palliative Medicine, who have advised PT is no longer congruent with Goals of Care;  Will sign off,    Van Clines, [place]  Acute Rehabilitation Services Pager [phone] Office [phone]

## 2023-03-20 NOTE — Progress Notes (Signed)
Hypoglycemic Event  CBG: 44  Treatment: D50 50 mL (25 gm)  Symptoms: None  Follow-up CBG: Time: 0821 CBG Result: 70  Possible Reasons for Event: Inadequate meal intake  Comments/MD notified: MD Terrill Mohr

## 2023-03-20 NOTE — Progress Notes (Addendum)
Spoke with [employer] rep, Dre, to request ICD turn off. Per Dre, will send out to local rep immediately.  1900: Per Shari Prows with [employer], patient only has pacemaker.

## 2023-03-21 DIAGNOSIS — Z7189 Other specified counseling: Secondary | ICD-10-CM | POA: Diagnosis not present

## 2023-03-21 DIAGNOSIS — Z515 Encounter for palliative care: Secondary | ICD-10-CM | POA: Diagnosis not present

## 2023-03-21 DIAGNOSIS — E43 Unspecified severe protein-calorie malnutrition: Secondary | ICD-10-CM

## 2023-03-21 DIAGNOSIS — J9601 Acute respiratory failure with hypoxia: Secondary | ICD-10-CM | POA: Diagnosis not present

## 2023-03-21 DIAGNOSIS — G9341 Metabolic encephalopathy: Secondary | ICD-10-CM | POA: Diagnosis not present

## 2023-03-21 DIAGNOSIS — I5021 Acute systolic (congestive) heart failure: Secondary | ICD-10-CM | POA: Diagnosis not present

## 2023-03-21 MED ORDER — HYDROMORPHONE HCL 1 MG/ML IJ SOLN
0.5000 mg | Freq: Three times a day (TID) | INTRAMUSCULAR | Status: DC
  Administered 2023-03-21: 0.5 mg via INTRAVENOUS
  Filled 2023-03-21 (×2): qty 1

## 2023-03-21 NOTE — Progress Notes (Signed)
Corey [name], Corey   PCP: Pincus Sanes, MD  Patient is from: Nursing home  DOA: 03/07/2023 LOS: 6  Chief complaints Chief Complaint  Patient presents with   Hypoglycemia     Brief Narrative / Interim history: 77 year old M with PMH of COPD, OSA, HFrEF/BiV HF, cardiac amyloidosis, PAH, CKD-4, paroxysmal A-fib on Eliquis, FTT, chronic hypotension and recent hospitalization from 10/15-10/29 for decompensated heart failure and discharged to SNF with Foley catheter due to urinary retention brought back to ED due to confusion, hypothermia and low blood glucose, and admitted to ICU with concern for sepsis in the setting of possible UTI, hypoglycemia, acute on chronic HFrEF acute encephalopathy and hypothermia.  Patient was treated for acute on chronic hypoxic hypercapnic respiratory failure in the setting of COPD and CHF exacerbation as well as atelectasis also treated for sepsis due to possible UTI, acute encephalopathy and hypoglycemia.  Eventually, CODE STATUS changed to DNR/DNI after goal of care discussion between PCCM and family members.  Plan was to continue current scope of care without escalation if further deterioration, and focus on comfort.  Patient was transferred out of ICU to hospitalist service on 03/19/2023.  He was on 2 L by nasal cannula.  Hemodynamically stable.  Significant labs include Cr 3.3 (about baseline), BUN 105 (improved), WBC 3.9, Hgb 11.8 (stable)  Patient was transitioned to full comfort care on 03/20/2023.  Family interested in [facility] but they did not qualify.  Palliative and TOC following.   Subjective: Seen and examined earlier this morning.  No major events overnight of this morning.  No complaints. Objective: Vitals:   03/20/23 0822 03/20/23 2041 03/21/23 0806 03/21/23 1006  BP: 117/78 112/77 (!) 120/50   Pulse: 81 60 (!) 46 61  Resp: 18 16 17    Temp: 98.2 F (36.8 C) 98.2 F (36.8 C) 97.7 F (36.5 C)    TempSrc:  Oral Oral   SpO2: 100% 98% (!) 77% 100%  Weight:      Height:        Examination:  GENERAL: Appears frail.  No apparent distress. HEENT: MMM.  Vision and hearing grossly intact.  NECK: Supple.  No apparent JVD.  RESP:  No IWOB.  Fair aeration bilaterally. CVS:  RRR. Heart sounds normal.  ABD/GI/GU: BS+. Abd soft, NTND.  Foley catheter in place.  Light pink urine.  No blood clot MSK/EXT:  Moves extremities. No apparent deformity. No edema.  SKIN: no apparent skin lesion or wound NEURO: Awake and alert.  Oriented to self and place.  Follows commands.  No apparent focal neuro deficit. PSYCH: Calm. Normal affect.   Procedures:  [name]  Microbiology summarized: MRSA PCR screen nonreactive Blood cultures NGTD Urine culture with insignificant growth  Assessment and plan: Principal Problem:   Acute metabolic encephalopathy Active Problems:   Acute systolic heart failure (HCC)   Acute respiratory failure with hypoxia and hypercapnia (HCC)   Pressure injury of skin   Protein-calorie malnutrition, severe   End of life care  End-of-life care: Palliative care met with patient and his wife.  Family decided to proceed with full comfort care.  -Family interested in [facility] but did not qualify. -Palliative and TOC following. -Defer symptom management to palliative care   Acute on chronic hypoxic and hypercapnic respiratory failure-multifactorial including possible COPD exacerbation, CHF exacerbation, deconditioning and atelectasis.  CXR concerning for CHF.  BNP persistently > 4500.  Treated with BiPAP and IV Lasix.  Brief course  of BSA.  Now on 2 L by nasal cannula saturating in upper 90s.  No respiratory distress.  -Now full comfort care.  Acute on chronic HFrEF/biventricular HF with history of cardiac amyloidosis/moderate PAH: TTE on 10/16 with LVEF 20-25% with severe decreased LV/RV function, global hypokinesis, severe LVH, mildly elevated PASP and RVSP.   ATTR  amyloidosis and is currently on tafamidis outpatient > wife to bring as we do not carry on formula.  Initial CXR concerning for CHF exacerbation.  BNP > 4500 suggesting poor prognosis.  Intermittently diuresed with IV Lasix.  Appears euvolemic on exam.  CKD-4 with uremia- h/o failed left BCF (placed 01/31/22). baseline sCr 2.8- 3.3.  Had excellent urine output after IV Lasix yesterday.  Relatively stable.  Not a candidate for dialysis.  CAD: R/LHC in 09/2020 with 80% LAD stenosis.  Repeat cath was not pursued due to renal failure.  Acute encephalopathy: Multifactorial including possible sepsis from UTI, uremia, hypoglycemia, underlying cognitive impairment.  CT head without acute finding.  He is BUN noticing 120s.  Seems to have resolved.  He is currently awake and alert.  Has not focal neurodeficit.   IDDM-2 with CKD-4 and recurrent hypoglycemia: TSH and cortisol within normal.  Likely due to poor p.o. intake.  Paroxysmal atrial fibrillation/flutter-V paced.   Acute on chronic hypotension: Hypotension seems to have resolved.  Sepsis and UTI ruled out.  Patient with chronic Foley.  Foley exchanged on 10/31.  Urine and blood cultures negative.  Completed course with cefepime and IV Zosyn on 11/4.   Hypothermia: Resolved.   Urinary retention discharged with foley from last admit.  Foley catheter exchanged on 10/31. Hematuria- gross hematuria likely 2/2 foley trauma exchange 10/31.  Resolving.  H&H stable. -Continue Foley catheter   Severe malnutrition/severe deconditioning/failure to thrive: CODE STATUS changed to DNR/DNI on 11/12 after discussion between PCCM and patient's wife.   Lactic acidosis: Likely due to respiratory failure and hypoperfusion.  No clear source of infection.   Severe malnutrition/increased nutrient needs Body mass index is [name].62 kg/m. Nutrition Problem: Severe Malnutrition Etiology: chronic illness Signs/Symptoms: severe fat depletion, severe muscle  depletion Interventions: Ensure Enlive (each supplement provides 350kcal and 20 grams of protein), Hormel Shake  Pressure skin injury: POA Pressure Injury 03/15/23 Buttocks Medial Stage 2 -  Partial thickness loss of dermis presenting as a shallow open injury with a red, pink wound bed without slough. stage 2 sacrum (Active)  03/15/23 0215  Location: Buttocks  Location Orientation: Medial  Staging: Stage 2 -  Partial thickness loss of dermis presenting as a shallow open injury with a red, pink wound bed without slough.  Wound Description (Comments): stage 2 sacrum  Present on Admission: Yes  Dressing Type Foam - Lift dressing to assess site every shift 03/21/23 0800   DVT prophylaxis:  Patient is full comfort care.  Code Status: DNR/DNI Family Communication: [name] at bedside Level of care: Telemetry Medical Status is: Inpatient Remains inpatient appropriate because: Residential hospice?   Final disposition: Residential hospice Consultants:  Pulmonology Cardiology Palliative medicine  35 minutes with more than 50% spent in reviewing records, counseling patient/family and coordinating care.   Sch Meds:  Scheduled Meds:  Chlorhexidine Gluconate Cloth  6 each Topical Daily   feeding supplement  237 mL Oral BID BM   melatonin  3 mg Oral QHS   mirtazapine  15 mg Oral QHS   Continuous Infusions:   PRN Meds:.acetaminophen, antiseptic oral rinse, docusate sodium, glycopyrrolate **OR** glycopyrrolate **OR** glycopyrrolate, HYDROmorphone (  DILAUDID) injection, ipratropium-albuterol, LORazepam **OR** LORazepam **OR** LORazepam, ondansetron (ZOFRAN) IV, oxyCODONE, polyvinyl alcohol  Antimicrobials: Anti-infectives (From admission, onward)    Start     Dose/Rate Route Frequency Ordered Stop   03/16/23 2200  vancomycin (VANCOREADY) IVPB 1250 mg/250 mL  Status:  Discontinued        1,250 mg 166.7 mL/hr over 90 Minutes Intravenous Every 48 hours 03/15/23 0135 03/15/23 0838   03/16/23  0800  piperacillin-tazobactam (ZOSYN) IVPB 3.375 g        3.375 g 12.5 mL/hr over 240 Minutes Intravenous Every 8 hours 03/16/23 0708 03/19/23 0225   03/15/23 1000  cefTRIAXone (ROCEPHIN) 2 g in sodium chloride 0.9 % 100 mL IVPB  Status:  Discontinued        2 g 200 mL/hr over 30 Minutes Intravenous Every 24 hours 03/15/23 0047 03/15/23 0838   03/15/23 0930  ceFEPIme (MAXIPIME) 1 g in sodium chloride 0.9 % 100 mL IVPB  Status:  Discontinued        1 g 200 mL/hr over 30 Minutes Intravenous Every 24 hours 03/15/23 0838 03/16/23 0708   03/15/23 0000  ceFEPIme (MAXIPIME) 2 g in sodium chloride 0.9 % 100 mL IVPB        2 g 200 mL/hr over 30 Minutes Intravenous  Once 02/24/2023 2350 03/15/23 0110   03/15/23 0000  metroNIDAZOLE (FLAGYL) IVPB 500 mg        500 mg 100 mL/hr over 60 Minutes Intravenous  Once 03/03/2023 2350 03/15/23 0132   03/15/23 0000  vancomycin (VANCOCIN) IVPB 1000 mg/200 mL premix  Status:  Discontinued        1,000 mg 200 mL/hr over 60 Minutes Intravenous  Once 03/15/2023 2350 03/02/2023 2354   03/15/23 0000  vancomycin (VANCOREADY) IVPB 1500 mg/300 mL        1,500 mg 150 mL/hr over 120 Minutes Intravenous  Once 02/16/2023 2354 03/15/23 0345        I have personally reviewed the following labs and images: CBC: Recent Labs  Lab 03/16/23 0341 03/16/23 1015 03/17/23 0458 03/17/23 1555 03/18/23 1044 03/19/23 0325  WBC 4.6 4.3 5.5  --  3.7* 3.9*  NEUTROABS 3.0  --   --   --   --   --   HGB 13.1 12.6* 13.8 12.1* 11.7* 11.8*  HCT 41.7 40.3 44.7 39.4 37.1* 38.1*  MCV 81.0 80.4 82.6  --  81.2 81.8  PLT 231 230 282  --  209 176   BMP &GFR Recent Labs  Lab 03/15/23 0255 03/15/23 0420 03/16/23 1015 03/17/23 0458 03/17/23 1555 03/17/23 2325 03/18/23 1044 03/19/23 0325  NA 136   < > 136 139 141 142 142 142  K 5.8*   < > 5.2* 5.0 4.2 4.0 3.8 4.5  CL 89*   < > 90* 95* 95* 97* 98 101  CO2 28   < > 30 29 [name] 30 31 28   GLUCOSE 151*   < > 84 50* 123* 111* 108* 130*  BUN 123*    < > 126* 121* 121* 115* 110* 105*  CREATININE 2.75*   < > 3.07* 3.18* 3.16* 3.23* 3.04* 3.30*  CALCIUM 9.3   < > 9.5 9.6 9.3 9.3 9.4 9.1  MG 3.0*  --   --   --   --   --  2.5*  --   PHOS 6.2*  --  5.4*  --   --   --  4.4  --    < > =  values in this interval not displayed.   Estimated Creatinine Clearance: 17.7 mL/min (A) (by C-G formula based on SCr of 3.3 mg/dL (H)). Liver & Pancreas: Recent Labs  Lab 03/15/2023 2215 03/16/23 1015 03/17/23 0458  AST 103*  --  78*  ALT 41  --  37  ALKPHOS 106  --  97  BILITOT 2.4*  --  2.0*  PROT 7.3  --  7.7  ALBUMIN 2.8* 2.8* 2.9*   No results for input(s): "LIPASE", "AMYLASE" in the last 168 hours. Recent Labs  Lab 03/15/23 0256  AMMONIA 53*   Diabetic: No results for input(s): "HGBA1C" in the last 72 hours. Recent Labs  Lab 03/19/23 1616 03/19/23 2049 03/20/23 0723 03/20/23 0821 03/20/23 1126  GLUCAP 166* 133* 44* 70 119*   Cardiac Enzymes: No results for input(s): "CKTOTAL", "CKMB", "CKMBINDEX", "TROPONINI" in the last 168 hours. No results for input(s): "PROBNP" in the last 8760 hours. Coagulation Profile: No results for input(s): "INR", "PROTIME" in the last 168 hours. Thyroid Function Tests: No results for input(s): "TSH", "T4TOTAL", "FREET4", "T3FREE", "THYROIDAB" in the last 72 hours. Lipid Profile: No results for input(s): "CHOL", "HDL", "LDLCALC", "TRIG", "CHOLHDL", "LDLDIRECT" in the last 72 hours. Anemia Panel: No results for input(s): "VITAMINB12", "FOLATE", "FERRITIN", "TIBC", "IRON", "RETICCTPCT" in the last 72 hours. Urine analysis:    Component Value Date/Time   COLORURINE RED (A) 03/15/2023 0226   APPEARANCEUR HAZY (A) 03/15/2023 0226   LABSPEC 1.011 03/15/2023 0226   PHURINE 6.0 03/15/2023 0226   GLUCOSEU NEGATIVE 03/15/2023 0226   GLUCOSEU NEGATIVE 07/20/2014 1055   HGBUR MODERATE (A) 03/15/2023 0226   HGBUR negative 08/28/2008 1446   BILIRUBINUR NEGATIVE 03/15/2023 0226   BILIRUBINUR Neg 10/28/2014  0916   KETONESUR NEGATIVE 03/15/2023 0226   PROTEINUR 100 (A) 03/15/2023 0226   UROBILINOGEN 0.2 10/28/2014 0916   UROBILINOGEN 0.2 07/20/2014 1055   NITRITE NEGATIVE 03/15/2023 0226   LEUKOCYTESUR TRACE (A) 03/15/2023 0226   Sepsis Labs: Invalid input(s): "PROCALCITONIN", "LACTICIDVEN"  Microbiology: Recent Results (from the past 240 hour(s))  Blood culture (routine x 2)     Status: [name]   Collection Time: 03/15/23 12:24 AM   Specimen: BLOOD RIGHT ARM  Result Value Ref Range Status   Specimen Description BLOOD RIGHT ARM  Final   Special Requests   Final    BOTTLES DRAWN AEROBIC AND ANAEROBIC Blood Culture adequate volume   Culture   Final    NO GROWTH 5 DAYS Performed at [facility], [address] [place], [place] [address]    Report Status 03/20/2023 FINAL  Final  Blood culture (routine x 2)     Status: [name]   Collection Time: 03/15/23  1:38 AM   Specimen: BLOOD  Result Value Ref Range Status   Specimen Description BLOOD LEFT ANTECUBITAL  Final   Special Requests   Final    BOTTLES DRAWN AEROBIC ONLY Blood Culture adequate volume   Culture   Final    NO GROWTH 5 DAYS Performed at [facility], [address], [place], [place] [address]    Report Status 03/20/2023 FINAL  Final  MRSA Next Gen by PCR, Nasal     Status: [name]   Collection Time: 03/15/23  2:01 AM   Specimen: Nasal Mucosa; Nasal Swab  Result Value Ref Range Status   MRSA by PCR Next Gen NOT DETECTED NOT DETECTED Final    Comment: (NOTE) The GeneXpert MRSA Assay (FDA approved for NASAL specimens only), is one  component of a comprehensive MRSA colonization surveillance program. It is not intended to diagnose MRSA infection nor to guide or monitor treatment for MRSA infections. Test performance is not FDA approved in patients less than 80 years old. Performed at [facility], [address] [place], [place] [address]   Remove and replace urinary cath (placed > 5 days) then obtain urine  culture from new indwelling urinary catheter.     Status: Abnormal   Collection Time: 03/15/23  2:[name] AM   Specimen: Urine, Catheterized  Result Value Ref Range Status   Specimen Description URINE, CATHETERIZED  Final   Special Requests [name]  Final   Culture (A)  Final    <10,000 COLONIES/mL INSIGNIFICANT GROWTH Performed at [facility], [address] [place], [place] [address]    Report Status 03/16/2023 FINAL  Final    Radiology Studies: No results found.    Dorlene Footman T. Lyvia Mondesir [facility]  If 7PM-7AM, please contact night-coverage [email or web address] 03/21/2023, 2:49 PM

## 2023-03-21 NOTE — Plan of Care (Signed)
  Problem: Education: Goal: Knowledge of the prescribed therapeutic regimen will improve Outcome: Progressing   Problem: Coping: Goal: Ability to identify and develop effective coping behavior will improve Outcome: Progressing   Problem: Clinical Measurements: Goal: Quality of life will improve Outcome: Progressing   Problem: Respiratory: Goal: Verbalizations of increased ease of respirations will increase Outcome: Progressing   Problem: Role Relationship: Goal: Family's ability to cope with current situation will improve Outcome: Progressing Goal: Ability to verbalize concerns, feelings, and thoughts to partner or family member will improve Outcome: Progressing   Problem: Pain Management: Goal: Satisfaction with pain management regimen will improve Outcome: Progressing  Palliative care

## 2023-03-21 NOTE — Progress Notes (Addendum)
Palliative Medicine Inpatient Follow Up Note HPI: 77 y.o. male with past medical history of HFrEF, cardiac amyloidosis, DM, CKD IV, HTN, OSA, aflutter with slow response with BiV pacemaker, prior CVA, and chronic hypotension on midodrine admitted on 02/17/2023 with hypoglycemia and increased confusion.   The Palliative care team has been asked to get involved to further address goals of care.  Today's Discussion 03/21/2023  *Please note that this is a verbal dictation therefore any spelling or grammatical errors are due to the "Dragon [employer] One" system interpretation.  Chart reviewed inclusive of vital signs, progress notes, laboratory results, and diagnostic images.   I met with Corey [name] this morning, he is quite tachypneic and able to answer questions with one word responses. He and I discussed the plan for comfort and for him to transition to hospice versus a facility with the aim of comfort.  I called and spoke to patients wife, Corey [name] who shares that [facility] has not accepted [place] as an inpatient. She expresses confusion over what to do next. She asks if we should start all of the prior interventions. I shared that both she and Corey [name] agree he is tired and wants to be comfortable. We reviewed again what this means inclusive of minimizing needle sticks and painful procedures. We discussed that patient has no options in terms of Corey [name] of his son caring for him at home. We reviewed the idea of him going to skilled facility short term with OP Palliative support. We reviewed he is likely to decline at which point he could transition to hospice and move to the inpatient unit.We reviewed that his situation is not ideal and causes him to move from place to place. I offered support listening to patient wife in regards to this concern. Corey [name] and I discussed next steps and I shared Corey [name] would reach out.   Corey [name] and I reviewed the active symptom needs required for inpatient hospice,. I shared  that I do feel Corey [name] will meet the needs in a short period of time though cannot guarantee when.  _____________________________ Addendum:  I met with Corey [name] this afternoon, his breathing was heavy and laborious.  Patient spouse, Corey [name] and I met outside his room. She expresses her frustrations with patients present health state and how she does not dee the utility in sending him to a facility to further decline only to come back here. We again discussed comfort care in detail. We reviewed no more sugar sticks, no fluids, no blood draws, no diagnostic tests, no cardia monitor. We discussed allowing him to pass away naturally and managing his symptoms as he transitions from this life to the nest. We discussed starting medications around the clock to support labored breathing. Patient wife is in agreement with this she shares her goal if for patient comfort. We reviewed having patient re-evaluated by hospice in the morning.  Questions and concerns addressed/Palliative Support Provided.  ___________________________ Addendum #2:  Discussed with patients wife at bedside again the plan for comfort and to only provide relief of symptoms. She again confirms understanding and the desire for patient to transition to [facility] She feels his eating the last day and half was him "rallying" and anticipates him declining. She shares the readiness of she and her son for him when he does pass though is tearful at the thought. I provided support through therapeutic listening.   Additional Time: 45  Objective Assessment: Vital Signs Vitals:   03/21/23 0806 03/21/23 1006  BP: (!) 120/50  Pulse: (!) 46 61  Resp: 17   Temp: 97.7 F (36.5 C)   SpO2: (!) 77% 100%    Intake/Output Summary (Last 24 hours) at 03/21/2023 1417 Last data filed at 03/21/2023 0830 Gross per 24 hour  Intake 250 ml  Output 750 ml  Net -500 ml   Last Weight  Most recent update: 03/20/2023  6:35 AM    Weight  74.8 kg (164 lb 14.5  oz)            Gen:  Elderly AA M in moderate distress HEENT: moist mucous membranes CV: Regular rate and rhythm PULM: Breathing on 2LPM [place], tachypneic ABD: soft/nontender EXT: No edema Neuro: Alert and oriented x2  SUMMARY OF RECOMMENDATIONS   DNAR/DNI  Comfort care  Comfort medications per [facility]  Corey [name] - Not accepted at [facility]. Patients wife hopeful for [facility] SNF with OP Palliative support  Ongoing PMT support  Billing based on MDM: High ______________________________________________________________________________________ Lamarr Lulas [facility] Palliative Medicine Team Team Cell Phone: [phone] Please utilize secure chat with additional questions, if there is no response within 30 minutes please call the above phone number  Palliative Medicine Team providers are available by phone from 7am to 7pm daily and can be reached through the team cell phone.  Should this patient require assistance outside of these hours, please call the patient's attending physician.

## 2023-03-22 DIAGNOSIS — I5043 Acute on chronic combined systolic (congestive) and diastolic (congestive) heart failure: Secondary | ICD-10-CM

## 2023-03-22 DIAGNOSIS — E854 Organ-limited amyloidosis: Secondary | ICD-10-CM

## 2023-03-22 DIAGNOSIS — I10 Essential (primary) hypertension: Secondary | ICD-10-CM

## 2023-03-22 DIAGNOSIS — I480 Paroxysmal atrial fibrillation: Secondary | ICD-10-CM

## 2023-03-22 DIAGNOSIS — I43 Cardiomyopathy in diseases classified elsewhere: Secondary | ICD-10-CM

## 2023-03-22 DIAGNOSIS — C61 Malignant neoplasm of prostate: Secondary | ICD-10-CM

## 2023-03-22 DIAGNOSIS — E119 Type 2 diabetes mellitus without complications: Secondary | ICD-10-CM

## 2023-03-23 DIAGNOSIS — R319 Hematuria, unspecified: Secondary | ICD-10-CM | POA: Insufficient documentation

## 2023-03-23 DIAGNOSIS — I959 Hypotension, unspecified: Secondary | ICD-10-CM | POA: Insufficient documentation

## 2023-03-23 DIAGNOSIS — R339 Retention of urine, unspecified: Secondary | ICD-10-CM | POA: Insufficient documentation

## 2023-03-26 ENCOUNTER — Encounter (HOSPITAL_COMMUNITY): Payer: Medicare HMO

## 2023-03-27 ENCOUNTER — Ambulatory Visit: Payer: Medicare HMO | Admitting: Cardiology

## 2023-03-29 ENCOUNTER — Other Ambulatory Visit: Payer: Self-pay

## 2023-04-02 ENCOUNTER — Encounter: Payer: Medicare HMO | Admitting: Cardiovascular Disease

## 2023-04-03 ENCOUNTER — Other Ambulatory Visit (HOSPITAL_COMMUNITY): Payer: Self-pay

## 2023-04-15 NOTE — Progress Notes (Signed)
   Palliative Medicine Inpatient Follow Up Note  I met at bedside this morning with Corey [name]. He was noted to not be breathing and have a lack of audible heart sounds.  I spoke with patients RN who shares patient was pronounced at 6:50 and family had been called in.  I met with Corey [name] and son in the meeting room. I offered them condolences and support given the difficulty of this information.  Patients [name] son and I went to bedside. I continued to offer support. I provided coffee and water. We talked about how good of a man Corey [name] was and how he can go on to be with god now.  No Charge  Time spent: 40 ______________________________________________________________________________________ Corey [name] Palliative Medicine Team Team Cell Phone: [phone] Please utilize secure chat with additional questions, if there is no response within 30 minutes please call the above phone number  Palliative Medicine Team providers are available by phone from 7am to 7pm daily and can be reached through the team cell phone.  Should this patient require assistance outside of these hours, please call the patient's attending physician.

## 2023-04-15 NOTE — Plan of Care (Signed)
  Problem: Education: Goal: Knowledge of General Education information will improve Description: Including pain rating scale, medication(s)/side effects and non-pharmacologic comfort measures 03/30/2023 0202 by Carolyne Littles, RN Outcome: Progressing 03/16/2023 0202 by Carolyne Littles, RN Outcome: Progressing   Problem: Health Behavior/Discharge Planning: Goal: Ability to manage health-related needs will improve 04/08/2023 0202 by Carolyne Littles, RN Outcome: Progressing 04/12/2023 0202 by Carolyne Littles, RN Outcome: Progressing   Problem: Clinical Measurements: Goal: Ability to maintain clinical measurements within normal limits will improve 03/27/2023 0202 by Carolyne Littles, RN Outcome: Progressing 04/01/2023 0202 by Carolyne Littles, RN Outcome: Progressing Goal: Will remain free from infection 03/23/2023 0202 by Carolyne Littles, RN Outcome: Progressing 03/18/2023 0202 by Carolyne Littles, RN Outcome: Progressing Goal: Diagnostic test results will improve 03/21/2023 0202 by Carolyne Littles, RN Outcome: Progressing 03/19/2023 0202 by Carolyne Littles, RN Outcome: Progressing Goal: Respiratory complications will improve 04/12/2023 0202 by Carolyne Littles, RN Outcome: Progressing 03/21/2023 0202 by Carolyne Littles, RN Outcome: Progressing

## 2023-04-15 NOTE — Death Summary Note (Signed)
DEATH SUMMARY   Patient Details  Name: Corey [name] MRN: [id number] DOB: Feb 19, 1946 WNU:UVOZD, Corey Mo, MD Admission/Discharge Information   Admit Date:  2023/03/26  Date of Death: Date of Death: April 03, 2023  Time of Death: Time of Death: 0650  Length of Stay: 7   Principle Cause of death: Acute on chronic combined CHF/biventricular heart failure  Hospital Diagnoses: Principal Problem:   Acute metabolic encephalopathy Active Problems:   Acute on chronic combined systolic and diastolic heart failure (HCC)   Chronic kidney disease (CKD), stage IV (severe) (HCC)   Hereditary cardiac amyloidosis (HCC)   Paroxysmal atrial fibrillation (HCC)   Essential hypertension   CAD (coronary artery disease)   Non-insulin dependent type 2 diabetes mellitus (HCC)   Prostate cancer (HCC)   OSA (obstructive sleep apnea)   Mild cognitive impairment   Acute systolic heart failure (HCC)   Acute respiratory failure with hypoxia and hypercapnia (HCC)   Pressure injury of skin   Protein-calorie malnutrition, severe   End of life care   Urinary retention   Hematuria   Hypotension   Sepsis/UTI ruled out.  Hospital Course: 77 year old M with PMH of COPD, OSA, HFrEF/BiV HF, cardiac amyloidosis, PAH, CKD-4, paroxysmal A-fib on Eliquis, FTT, chronic hypotension and recent hospitalization from 10/15-10/29 for decompensated heart failure and discharged to SNF with Foley catheter due to urinary retention brought back to ED due to confusion, hypothermia and low blood glucose, and admitted to ICU with concern for sepsis in the setting of possible UTI, hypoglycemia, acute on chronic HFrEF acute encephalopathy and hypothermia.  Patient was treated for acute on chronic hypoxic hypercapnic respiratory failure in the setting of COPD and CHF exacerbation as well as atelectasis also treated for sepsis due to possible UTI, acute encephalopathy and hypoglycemia.  Eventually, CODE STATUS changed to DNR/DNI after goal of  care discussion between PCCM and family members.  Plan was to continue current scope of care without escalation if further deterioration, and focus on comfort.   Patient was transferred out of ICU to hospitalist service on 03/19/2023.  He was on 2 L by nasal cannula.  Hemodynamically stable.  Significant labs include Cr 3.3 (about baseline), BUN 105 (improved), WBC 3.9, Hgb 11.8 (stable)   Patient was transitioned to full comfort care on 04/01/2023, and passed away on April 03, 2023  Assessment and Plan:         Procedures: None  Consultations: Pulmonology, cardiology and palliative medicine  The results of significant diagnostics from this hospitalization (including imaging, microbiology, ancillary and laboratory) are listed below for reference.   Significant Diagnostic Studies: DG Chest Port 1 View  Result Date: 03/17/2023 CLINICAL DATA:  Respiratory distress. EXAM: PORTABLE CHEST 1 VIEW COMPARISON:  2023/03/26 FINDINGS: Low volume film. The cardio pericardial silhouette is enlarged. There is pulmonary vascular congestion without overt pulmonary edema. Interval improvement in aeration at the right base with persistent retrocardiac left base collapse/consolidation. Prominent skin folds overlie the right lung. Right-sided permanent pacemaker again noted. Telemetry leads overlie the chest. IMPRESSION: 1. Low volume film with pulmonary vascular congestion. 2. Interval improvement in aeration at the right base with persistent retrocardiac left base collapse/consolidation. Electronically Signed   By: Kennith Center M.D.   On: 03/17/2023 07:58   CT Head Wo Contrast  Result Date: 03/15/2023 CLINICAL DATA:  Altered mental status EXAM: CT HEAD WITHOUT CONTRAST TECHNIQUE: Contiguous axial images were obtained from the base of the skull through the vertex without intravenous contrast. RADIATION DOSE REDUCTION: This exam was  performed according to the departmental dose-optimization program which includes  automated exposure control, adjustment of the mA and/or kV according to patient size and/or use of iterative reconstruction technique. COMPARISON:  CT brain 03/23/2022, MRI 01/07/2015 FINDINGS: Brain: No acute territorial infarction, hemorrhage or focal intracranial mass. Small chronic left cerebellar infarct. Mild chronic small vessel ischemic changes of the white matter. Nonenlarged ventricles Vascular: No hyperdense vessels.  Carotid vascular calcification. Skull: No fracture. Extensive dural and tentorial calcifications as noted on prior exams potentially due to metabolic disease. Sinuses/Orbits: No acute finding. Other: Moderate gas incompletely visualized within the right greater than left masticator space. IMPRESSION: 1. No CT evidence for acute intracranial abnormality. 2. Mild chronic small vessel ischemic changes of the white matter. Small chronic left cerebellar infarct. 3. Moderate gas incompletely visualized within the right greater than left masticator space, this is of uncertain source and significance, correlate for any signs or symptoms of infection. Electronically Signed   By: Jasmine Pang M.D.   On: 03/15/2023 00:25   DG Chest Portable 1 View  Result Date: 02/23/2023 CLINICAL DATA:  Shortness of breath EXAM: PORTABLE CHEST 1 VIEW COMPARISON:  02/27/2023 FINDINGS: Cardiomegaly. Bilateral chest wall pacing devices. Pulmonary vascular congestion. Low lung volumes with basilar atelectasis and accentuation of the pulmonary vascularity. Question small bilateral pleural effusions. No pneumothorax. IMPRESSION: Exam is compromised by low lung volumes. Cardiomegaly with pulmonary vascular congestion and possible small bilateral pleural effusions. Electronically Signed   By: Minerva Fester M.D.   On: 02/13/2023 23:38   VAS [place] LOWER EXTREMITY VENOUS (DVT)  Result Date: 02/28/2023  Lower Venous DVT Study Patient Name:  [name]  Date of Exam:   02/28/2023 Medical Rec #: [id number]     Accession  #:    [id number] Date of Birth: [birth year]     Patient Gender: M Patient Age:   73 years Exam Location:  [facility] Procedure:      VAS [place] LOWER EXTREMITY VENOUS (DVT) Referring Phys: Ulyess Blossom RATHORE --------------------------------------------------------------------------------  Indications: Swelling, and Edema.  Risk Factors: Surgery 12/28/22 pacemaker implant. Comparison Study: No prior study Performing Technologist: Shona Simpson  Examination Guidelines: A complete evaluation includes B-mode imaging, spectral Doppler, color Doppler, and power Doppler as needed of all accessible portions of each vessel. Bilateral testing is considered an integral part of a complete examination. Limited examinations for reoccurring indications may be performed as noted. The reflux portion of the exam is performed with the patient in reverse Trendelenburg.  +---------+---------------+---------+-----------+----------+--------------+ RIGHT    CompressibilityPhasicitySpontaneityPropertiesThrombus Aging +---------+---------------+---------+-----------+----------+--------------+ CFV      Full           Yes      Yes                                 +---------+---------------+---------+-----------+----------+--------------+ SFJ      Full                                                        +---------+---------------+---------+-----------+----------+--------------+ FV Prox  Full                                                        +---------+---------------+---------+-----------+----------+--------------+  FV Mid   Full                                                        +---------+---------------+---------+-----------+----------+--------------+ FV DistalFull                                                        +---------+---------------+---------+-----------+----------+--------------+ PFV      Full                                                         +---------+---------------+---------+-----------+----------+--------------+ POP      Full           Yes      Yes                                 +---------+---------------+---------+-----------+----------+--------------+ PTV      Full                                                        +---------+---------------+---------+-----------+----------+--------------+ PERO     Full                                                        +---------+---------------+---------+-----------+----------+--------------+   +----+---------------+---------+-----------+----------+--------------+ LEFTCompressibilityPhasicitySpontaneityPropertiesThrombus Aging +----+---------------+---------+-----------+----------+--------------+ CFV Full           Yes      Yes                                 +----+---------------+---------+-----------+----------+--------------+     Summary: RIGHT: - There is no evidence of deep vein thrombosis in the lower extremity.  - No cystic structure found in the popliteal fossa. Lymphatic swelling  LEFT: - No evidence of common femoral vein obstruction.   *See table(s) above for measurements and observations. Electronically signed by Coral Else MD on 02/28/2023 at 7:20:30 PM.    Final    ECHOCARDIOGRAM COMPLETE  Result Date: 02/28/2023    ECHOCARDIOGRAM REPORT   Patient Name:   MARIA GOTZ Date of Exam: 02/28/2023 Medical Rec #:  [id number]    Height:       66.0 in Accession #:    [id number]   Weight:       187.8 lb Date of Birth:  08/23/1945    BSA:          1.947 m Patient Age:    76 years     BP:           144/114 mmHg Patient Gender: M  HR:           60 bpm. Exam Location:  Inpatient Procedure: 2D Echo, Cardiac Doppler, Color Doppler and Intracardiac            Opacification Agent Indications:    CHF  History:        Patient has prior history of Echocardiogram examinations, most                 recent 06/23/2022. CHF, CAD, Arrythmias:Atrial Fibrillation;  Risk                 Factors:Hypertension, Diabetes and Dyslipidemia.  Sonographer:    Harriette Bouillon Referring Phys: [id number] [name] IMPRESSIONS  1. Sluggish flow at the LV apex but not thrombus. Left ventricular ejection fraction, by estimation, is 20 to 25%. Left ventricular ejection fraction by 2D MOD biplane is 20.8 %. The left ventricle has severely decreased function. The left ventricle demonstrates global hypokinesis. There is severe concentric left ventricular hypertrophy. Left ventricular diastolic function could not be evaluated.  2. Right ventricular systolic function is severely reduced. The right ventricular size is mildly enlarged. There is mildly elevated pulmonary artery systolic pressure. The estimated right ventricular systolic pressure is 41.8 mmHg.  3. Left atrial size was mildly dilated.  4. Right atrial size was moderately dilated.  5. The mitral valve is grossly normal. Mild mitral valve regurgitation. No evidence of mitral stenosis.  6. The aortic valve is tricuspid. Aortic valve regurgitation is not visualized. No aortic stenosis is present.  7. There is mild dilatation of the ascending aorta, measuring 40 mm.  8. The inferior vena cava is dilated in size with <50% respiratory variability, suggesting right atrial pressure of 15 mmHg. Comparison(s): Changes from prior study are noted. The left ventricular function is worsened. The right ventricular systolic function is worse. FINDINGS  Left Ventricle: Sluggish flow at the LV apex but not thrombus. Left ventricular ejection fraction, by estimation, is 20 to 25%. Left ventricular ejection fraction by 2D MOD biplane is 20.8 %. The left ventricle has severely decreased function. The left ventricle demonstrates global hypokinesis. Definity contrast agent was given IV to delineate the left ventricular endocardial borders. The left ventricular internal cavity size was normal in size. There is severe concentric left ventricular hypertrophy.  Left ventricular diastolic function could not be evaluated due to atrial fibrillation. Left ventricular diastolic function could not be evaluated. Right Ventricle: The right ventricular size is mildly enlarged. No increase in right ventricular wall thickness. Right ventricular systolic function is severely reduced. There is mildly elevated pulmonary artery systolic pressure. The tricuspid regurgitant velocity is 2.59 m/s, and with an assumed right atrial pressure of 15 mmHg, the estimated right ventricular systolic pressure is 41.8 mmHg. Left Atrium: Left atrial size was mildly dilated. Right Atrium: Right atrial size was moderately dilated. Pericardium: There is no evidence of pericardial effusion. Mitral Valve: The mitral valve is grossly normal. There is mild thickening of the anterior and posterior mitral valve leaflet(s). Mild mitral valve regurgitation. No evidence of mitral valve stenosis. Tricuspid Valve: The tricuspid valve is grossly normal. Tricuspid valve regurgitation is mild . No evidence of tricuspid stenosis. Aortic Valve: The aortic valve is tricuspid. Aortic valve regurgitation is not visualized. No aortic stenosis is present. Pulmonic Valve: The pulmonic valve was grossly normal. Pulmonic valve regurgitation is trivial. No evidence of pulmonic stenosis. Aorta: The aortic root is normal in size and structure. There is mild dilatation of the ascending aorta, measuring 40  mm. Venous: The inferior vena cava is dilated in size with less than 50% respiratory variability, suggesting right atrial pressure of 15 mmHg. IAS/Shunts: The atrial septum is grossly normal.  LEFT VENTRICLE PLAX 2D                        Biplane EF (MOD) LVIDd:         4.70 cm         LV Biplane EF:   Left LVIDs:         4.30 cm                          ventricular LV PW:         1.40 cm                          ejection LV IVS:        1.50 cm                          fraction by LVOT diam:     2.40 cm                          2D  MOD LV SV:         55                               biplane is LV SV Index:   28                               20.8 %. LVOT Area:     4.52 cm  LV Volumes (MOD) LV vol d, MOD    178.0 ml A2C: LV vol d, MOD    96.3 ml A4C: LV vol s, MOD    139.0 ml A2C: LV vol s, MOD    80.1 ml A4C: LV SV MOD A2C:   39.0 ml LV SV MOD A4C:   96.3 ml LV SV MOD BP:    27.7 ml RIGHT VENTRICLE         IVC TAPSE (M-mode): 1.0 cm  IVC diam: 2.80 cm LEFT ATRIUM             Index        RIGHT ATRIUM           Index LA diam:        4.70 cm 2.41 cm/m   RA Area:     25.90 cm LA Vol (A2C):   93.6 ml 48.07 ml/m  RA Volume:   96.80 ml  49.71 ml/m LA Vol (A4C):   51.5 ml 26.45 ml/m LA Biplane Vol: 70.5 ml 36.21 ml/m  AORTIC VALVE LVOT Vmax:   71.70 cm/s LVOT Vmean:  46.900 cm/s LVOT VTI:    0.122 m  AORTA Ao Root diam: 3.50 cm Ao Asc diam:  4.00 cm TRICUSPID VALVE TR Peak grad:   26.8 mmHg TR Vmax:        259.00 cm/s  SHUNTS Systemic VTI:  0.12 m Systemic Diam: 2.40 cm Lennie Odor MD Electronically signed by Lennie Odor MD Signature Date/Time: 02/28/2023/6:05:18 PM    Final    NM Pulmonary Perfusion  Result Date: 02/28/2023 CLINICAL DATA:  Concern for pulmonary embolism. Short of breath. Heart failure. EXAM: NUCLEAR MEDICINE PERFUSION LUNG SCAN TECHNIQUE: Perfusion images were obtained in multiple projections after intravenous injection of radiopharmaceutical. RADIOPHARMACEUTICALS:  4.2 mCi Tc-60m MAA COMPARISON:  Chest radiograph 02/27/2023 FINDINGS: No wedge-shaped peripheral perfusion defects to suggest acute pulmonary embolism. Linear photopenic bands relate to fluid within the pulmonary fissures. Cardiac attenuation. IMPRESSION: 1. No evidence of acute pulmonary disease. 2. Fluid within the pulmonary fissures. Electronically Signed   By: Genevive Bi M.D.   On: 02/28/2023 12:34   DG Shoulder Right  Result Date: 02/28/2023 CLINICAL DATA:  [id number].  Right shoulder pain.  [id number].  Fall injury. EXAM: RIGHT SHOULDER - 2+ VIEW  COMPARISON:  None Available. FINDINGS: There is normal bone mineralization without evidence for fractures or dislocations. There is acromial spurring as well narrowing and osteophytes at the [facility] joint and a loose body in the superior AC joint space. There is lesser narrowing and spurring of the glenohumeral joint. Acromiohumeral abutment is noted consistent with chronic rotator cuff arthropathy and a likely degenerative rotator cuff chronic tear. Soft tissues are unremarkable apart from a right chest dual lead pacing system with wiring partially visible. IMPRESSION: 1. Degenerative changes without evidence of fractures or dislocations. 2. Acromiohumeral abutment consistent with chronic rotator cuff arthropathy and a likely degenerative rotator cuff tear. Electronically Signed   By: Almira Bar M.D.   On: 02/28/2023 05:42   DG Chest 2 View  Result Date: 02/27/2023 CLINICAL DATA:  Shortness of breath EXAM: CHEST - 2 VIEW COMPARISON:  Chest radiograph 12/29/2022 FINDINGS: Bilateral chest wall cardiac device is are unchanged. The heart is enlarged, unchanged. The upper mediastinal contours are grossly stable Lung volumes are markedly low with bibasilar airspace opacities likely reflecting pleural effusions and adjacent airspace opacity which may reflect atelectasis or infection. There is probable mild pulmonary interstitial edema. Findings are worsened since the prior study. There is no definite pneumothorax. There is no acute osseous abnormality. IMPRESSION: Cardiomegaly with probable mild pulmonary interstitial edema and left larger than right pleural effusions with adjacent airspace opacity which may reflect atelectasis or infection. Findings are worsened since the prior study. Electronically Signed   By: Lesia Hausen M.D.   On: 02/27/2023 20:06    Microbiology: Recent Results (from the past 240 hour(s))  Blood culture (routine x 2)     Status: None   Collection Time: 03/15/23 12:24 AM   Specimen: BLOOD  RIGHT ARM  Result Value Ref Range Status   Specimen Description BLOOD RIGHT ARM  Final   Special Requests   Final    BOTTLES DRAWN AEROBIC AND ANAEROBIC Blood Culture adequate volume   Culture   Final    NO GROWTH 5 DAYS Performed at [facility], [address], [place], [place] [address]    Report Status 03/20/2023 FINAL  Final  Blood culture (routine x 2)     Status: None   Collection Time: 03/15/23  1:38 AM   Specimen: BLOOD  Result Value Ref Range Status   Specimen Description BLOOD LEFT ANTECUBITAL  Final   Special Requests   Final    BOTTLES DRAWN AEROBIC ONLY Blood Culture adequate volume   Culture   Final    NO GROWTH 5 DAYS Performed at [facility], [address] [place], [place] [address]    Report Status 03/20/2023 FINAL  Final  MRSA Next Gen by PCR, Nasal     Status: None   Collection Time: 03/15/23  2:01 AM   Specimen: Nasal Mucosa; Nasal Swab  Result Value Ref Range Status   MRSA by PCR Next Gen NOT DETECTED NOT DETECTED Final    Comment: (NOTE) The GeneXpert MRSA Assay (FDA approved for NASAL specimens only), is one component of a comprehensive MRSA colonization surveillance program. It is not intended to diagnose MRSA infection nor to guide or monitor treatment for MRSA infections. Test performance is not FDA approved in patients less than 38 years old. Performed at [facility], [address], [place], [place] [address]   Remove and replace urinary cath (placed > 5 days) then obtain urine culture from new indwelling urinary catheter.     Status: Abnormal   Collection Time: 03/15/23  2:26 AM   Specimen: Urine, Catheterized  Result Value Ref Range Status   Specimen Description URINE, CATHETERIZED  Final   Special Requests NONE  Final   Culture (A)  Final    <10,000 COLONIES/mL INSIGNIFICANT GROWTH Performed at [facility], [address], [place], [place] [address]    Report Status 03/16/2023 FINAL  Final    Time spent: 35  minutes  Signed: Almon Hercules, MD 03/23/23

## 2023-04-15 DEATH — deceased

## 2023-04-19 ENCOUNTER — Encounter (HOSPITAL_COMMUNITY): Payer: Medicare HMO | Admitting: Cardiology

## 2023-05-18 ENCOUNTER — Other Ambulatory Visit: Payer: Medicare HMO

## 2023-05-24 ENCOUNTER — Ambulatory Visit: Payer: Medicare HMO | Admitting: Endocrinology

## 2023-06-05 ENCOUNTER — Ambulatory Visit: Payer: Medicare HMO | Admitting: Podiatry
